# Patient Record
Sex: Female | Born: 1975 | Race: White | Hispanic: No | Smoking: Current every day smoker
Health system: Southern US, Community
[De-identification: ages and names within clinical notes are randomized; demographics above are authoritative.]

## PROBLEM LIST (undated history)

## (undated) DIAGNOSIS — N393 Stress incontinence (female) (male): Secondary | ICD-10-CM

## (undated) DIAGNOSIS — G43909 Migraine, unspecified, not intractable, without status migrainosus: Secondary | ICD-10-CM

## (undated) DIAGNOSIS — M549 Dorsalgia, unspecified: Secondary | ICD-10-CM

## (undated) DIAGNOSIS — Z9289 Personal history of other medical treatment: Secondary | ICD-10-CM

## (undated) DIAGNOSIS — R7611 Nonspecific reaction to tuberculin skin test without active tuberculosis: Secondary | ICD-10-CM

## (undated) DIAGNOSIS — F329 Major depressive disorder, single episode, unspecified: Secondary | ICD-10-CM

## (undated) DIAGNOSIS — G629 Polyneuropathy, unspecified: Secondary | ICD-10-CM

## (undated) DIAGNOSIS — G8929 Other chronic pain: Secondary | ICD-10-CM

## (undated) DIAGNOSIS — J189 Pneumonia, unspecified organism: Secondary | ICD-10-CM

## (undated) DIAGNOSIS — I82409 Acute embolism and thrombosis of unspecified deep veins of unspecified lower extremity: Secondary | ICD-10-CM

## (undated) DIAGNOSIS — M5430 Sciatica, unspecified side: Secondary | ICD-10-CM

## (undated) DIAGNOSIS — F319 Bipolar disorder, unspecified: Secondary | ICD-10-CM

## (undated) DIAGNOSIS — F39 Unspecified mood [affective] disorder: Secondary | ICD-10-CM

## (undated) DIAGNOSIS — M1990 Unspecified osteoarthritis, unspecified site: Secondary | ICD-10-CM

## (undated) DIAGNOSIS — I10 Essential (primary) hypertension: Secondary | ICD-10-CM

## (undated) DIAGNOSIS — F32A Depression, unspecified: Secondary | ICD-10-CM

## (undated) HISTORY — PX: TUBAL LIGATION: SHX77

## (undated) HISTORY — PX: BACK SURGERY: SHX140

## (undated) HISTORY — PX: LUMBAR FUSION: SHX111

## (undated) HISTORY — PX: LUMBAR DISC SURGERY: SHX700

## (undated) HISTORY — PX: GASTRIC BYPASS: SHX52

---
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
## 1998-07-18 ENCOUNTER — Emergency Department (HOSPITAL_COMMUNITY): Admission: EM | Admit: 1998-07-18 | Discharge: 1998-07-18 | Payer: Self-pay | Admitting: Emergency Medicine

## 1998-11-14 ENCOUNTER — Emergency Department (HOSPITAL_COMMUNITY): Admission: EM | Admit: 1998-11-14 | Discharge: 1998-11-14 | Payer: Self-pay | Admitting: *Deleted

## 1998-11-14 ENCOUNTER — Encounter: Payer: Self-pay | Admitting: Emergency Medicine

## 1999-03-11 ENCOUNTER — Encounter: Payer: Self-pay | Admitting: Obstetrics

## 1999-03-11 ENCOUNTER — Inpatient Hospital Stay (HOSPITAL_COMMUNITY): Admission: AD | Admit: 1999-03-11 | Discharge: 1999-03-11 | Payer: Self-pay | Admitting: Obstetrics

## 1999-06-05 ENCOUNTER — Encounter: Payer: Self-pay | Admitting: *Deleted

## 1999-06-05 ENCOUNTER — Ambulatory Visit (HOSPITAL_COMMUNITY): Admission: RE | Admit: 1999-06-05 | Discharge: 1999-06-05 | Payer: Self-pay | Admitting: *Deleted

## 1999-08-25 ENCOUNTER — Encounter: Payer: Self-pay | Admitting: *Deleted

## 1999-08-25 ENCOUNTER — Ambulatory Visit (HOSPITAL_COMMUNITY): Admission: RE | Admit: 1999-08-25 | Discharge: 1999-08-25 | Payer: Self-pay | Admitting: *Deleted

## 1999-10-05 ENCOUNTER — Encounter: Payer: Self-pay | Admitting: *Deleted

## 1999-10-05 ENCOUNTER — Inpatient Hospital Stay (HOSPITAL_COMMUNITY): Admission: AD | Admit: 1999-10-05 | Discharge: 1999-10-05 | Payer: Self-pay | Admitting: *Deleted

## 1999-10-08 ENCOUNTER — Inpatient Hospital Stay (HOSPITAL_COMMUNITY): Admission: AD | Admit: 1999-10-08 | Discharge: 1999-10-11 | Payer: Self-pay | Admitting: *Deleted

## 2000-06-10 ENCOUNTER — Encounter: Admission: RE | Admit: 2000-06-10 | Discharge: 2000-06-10 | Payer: Self-pay | Admitting: Family Medicine

## 2000-08-16 ENCOUNTER — Encounter: Admission: RE | Admit: 2000-08-16 | Discharge: 2000-08-16 | Payer: Self-pay | Admitting: Family Medicine

## 2000-09-06 ENCOUNTER — Encounter: Admission: RE | Admit: 2000-09-06 | Discharge: 2000-09-06 | Payer: Self-pay | Admitting: Family Medicine

## 2000-09-27 ENCOUNTER — Encounter: Admission: RE | Admit: 2000-09-27 | Discharge: 2000-09-27 | Payer: Self-pay | Admitting: Family Medicine

## 2000-10-14 ENCOUNTER — Ambulatory Visit (HOSPITAL_COMMUNITY): Admission: RE | Admit: 2000-10-14 | Discharge: 2000-10-14 | Payer: Self-pay | Admitting: Urology

## 2000-10-21 ENCOUNTER — Encounter: Admission: RE | Admit: 2000-10-21 | Discharge: 2000-10-21 | Payer: Self-pay | Admitting: Family Medicine

## 2000-12-16 ENCOUNTER — Encounter: Admission: RE | Admit: 2000-12-16 | Discharge: 2000-12-16 | Payer: Self-pay | Admitting: Family Medicine

## 2001-02-22 ENCOUNTER — Encounter: Admission: RE | Admit: 2001-02-22 | Discharge: 2001-02-22 | Payer: Self-pay | Admitting: Family Medicine

## 2001-02-23 ENCOUNTER — Encounter: Admission: RE | Admit: 2001-02-23 | Discharge: 2001-02-23 | Payer: Self-pay | Admitting: Family Medicine

## 2001-04-14 ENCOUNTER — Encounter: Admission: RE | Admit: 2001-04-14 | Discharge: 2001-04-14 | Payer: Self-pay | Admitting: Family Medicine

## 2001-04-16 ENCOUNTER — Emergency Department (HOSPITAL_COMMUNITY): Admission: EM | Admit: 2001-04-16 | Discharge: 2001-04-16 | Payer: Self-pay | Admitting: Emergency Medicine

## 2001-04-17 ENCOUNTER — Encounter: Admission: RE | Admit: 2001-04-17 | Discharge: 2001-04-17 | Payer: Self-pay | Admitting: Family Medicine

## 2001-04-17 ENCOUNTER — Encounter: Payer: Self-pay | Admitting: Sports Medicine

## 2001-04-17 ENCOUNTER — Inpatient Hospital Stay (HOSPITAL_COMMUNITY): Admission: RE | Admit: 2001-04-17 | Discharge: 2001-04-21 | Payer: Self-pay | Admitting: Sports Medicine

## 2001-05-02 ENCOUNTER — Encounter: Admission: RE | Admit: 2001-05-02 | Discharge: 2001-05-02 | Payer: Self-pay | Admitting: Family Medicine

## 2001-05-15 ENCOUNTER — Encounter: Admission: RE | Admit: 2001-05-15 | Discharge: 2001-05-15 | Payer: Self-pay | Admitting: Family Medicine

## 2001-06-14 ENCOUNTER — Encounter: Admission: RE | Admit: 2001-06-14 | Discharge: 2001-06-14 | Payer: Self-pay | Admitting: Family Medicine

## 2001-06-19 ENCOUNTER — Encounter: Admission: RE | Admit: 2001-06-19 | Discharge: 2001-06-19 | Payer: Self-pay | Admitting: Family Medicine

## 2001-06-26 ENCOUNTER — Encounter: Admission: RE | Admit: 2001-06-26 | Discharge: 2001-06-26 | Payer: Self-pay | Admitting: Sports Medicine

## 2001-06-29 ENCOUNTER — Encounter: Admission: RE | Admit: 2001-06-29 | Discharge: 2001-06-29 | Payer: Self-pay | Admitting: Family Medicine

## 2001-07-03 ENCOUNTER — Encounter: Admission: RE | Admit: 2001-07-03 | Discharge: 2001-07-03 | Payer: Self-pay | Admitting: Family Medicine

## 2001-07-06 ENCOUNTER — Encounter: Admission: RE | Admit: 2001-07-06 | Discharge: 2001-07-06 | Payer: Self-pay | Admitting: Family Medicine

## 2001-07-12 ENCOUNTER — Encounter: Admission: RE | Admit: 2001-07-12 | Discharge: 2001-07-12 | Payer: Self-pay | Admitting: Family Medicine

## 2001-07-18 ENCOUNTER — Encounter: Admission: RE | Admit: 2001-07-18 | Discharge: 2001-07-18 | Payer: Self-pay | Admitting: Sports Medicine

## 2001-07-22 ENCOUNTER — Emergency Department (HOSPITAL_COMMUNITY): Admission: EM | Admit: 2001-07-22 | Discharge: 2001-07-22 | Payer: Self-pay | Admitting: Emergency Medicine

## 2001-08-01 ENCOUNTER — Encounter: Admission: RE | Admit: 2001-08-01 | Discharge: 2001-08-01 | Payer: Self-pay | Admitting: Family Medicine

## 2001-08-08 ENCOUNTER — Encounter: Admission: RE | Admit: 2001-08-08 | Discharge: 2001-08-08 | Payer: Self-pay | Admitting: Family Medicine

## 2001-08-11 ENCOUNTER — Encounter: Admission: RE | Admit: 2001-08-11 | Discharge: 2001-08-11 | Payer: Self-pay | Admitting: Family Medicine

## 2001-08-17 ENCOUNTER — Encounter: Admission: RE | Admit: 2001-08-17 | Discharge: 2001-08-17 | Payer: Self-pay | Admitting: Family Medicine

## 2001-08-18 ENCOUNTER — Encounter: Admission: RE | Admit: 2001-08-18 | Discharge: 2001-08-18 | Payer: Self-pay | Admitting: Family Medicine

## 2001-08-21 ENCOUNTER — Encounter: Admission: RE | Admit: 2001-08-21 | Discharge: 2001-08-21 | Payer: Self-pay | Admitting: Family Medicine

## 2001-08-22 ENCOUNTER — Encounter (INDEPENDENT_AMBULATORY_CARE_PROVIDER_SITE_OTHER): Payer: Self-pay | Admitting: *Deleted

## 2001-08-24 ENCOUNTER — Encounter: Admission: RE | Admit: 2001-08-24 | Discharge: 2001-08-24 | Payer: Self-pay | Admitting: Family Medicine

## 2001-08-29 ENCOUNTER — Encounter: Admission: RE | Admit: 2001-08-29 | Discharge: 2001-08-29 | Payer: Self-pay | Admitting: Family Medicine

## 2001-08-29 ENCOUNTER — Other Ambulatory Visit: Admission: RE | Admit: 2001-08-29 | Discharge: 2001-08-29 | Payer: Self-pay | Admitting: Family Medicine

## 2001-09-18 ENCOUNTER — Encounter: Admission: RE | Admit: 2001-09-18 | Discharge: 2001-09-18 | Payer: Self-pay | Admitting: Sports Medicine

## 2001-09-28 ENCOUNTER — Encounter: Admission: RE | Admit: 2001-09-28 | Discharge: 2001-09-28 | Payer: Self-pay | Admitting: Family Medicine

## 2001-10-02 ENCOUNTER — Encounter: Admission: RE | Admit: 2001-10-02 | Discharge: 2001-10-02 | Payer: Self-pay | Admitting: Family Medicine

## 2001-10-13 ENCOUNTER — Encounter: Admission: RE | Admit: 2001-10-13 | Discharge: 2001-10-13 | Payer: Self-pay | Admitting: Family Medicine

## 2001-10-26 ENCOUNTER — Emergency Department (HOSPITAL_COMMUNITY): Admission: EM | Admit: 2001-10-26 | Discharge: 2001-10-26 | Payer: Self-pay | Admitting: Emergency Medicine

## 2001-11-08 ENCOUNTER — Emergency Department (HOSPITAL_COMMUNITY): Admission: EM | Admit: 2001-11-08 | Discharge: 2001-11-08 | Payer: Self-pay | Admitting: Emergency Medicine

## 2002-01-09 ENCOUNTER — Emergency Department (HOSPITAL_COMMUNITY): Admission: EM | Admit: 2002-01-09 | Discharge: 2002-01-09 | Payer: Self-pay | Admitting: Emergency Medicine

## 2002-10-29 ENCOUNTER — Emergency Department (HOSPITAL_COMMUNITY): Admission: EM | Admit: 2002-10-29 | Discharge: 2002-10-29 | Payer: Self-pay

## 2003-04-03 ENCOUNTER — Emergency Department (HOSPITAL_COMMUNITY): Admission: EM | Admit: 2003-04-03 | Discharge: 2003-04-03 | Payer: Self-pay | Admitting: Emergency Medicine

## 2003-05-24 ENCOUNTER — Emergency Department (HOSPITAL_COMMUNITY): Admission: EM | Admit: 2003-05-24 | Discharge: 2003-05-24 | Payer: Self-pay | Admitting: Emergency Medicine

## 2003-10-13 ENCOUNTER — Emergency Department (HOSPITAL_COMMUNITY): Admission: EM | Admit: 2003-10-13 | Discharge: 2003-10-13 | Payer: Self-pay | Admitting: Emergency Medicine

## 2004-03-28 ENCOUNTER — Emergency Department (HOSPITAL_COMMUNITY): Admission: EM | Admit: 2004-03-28 | Discharge: 2004-03-28 | Payer: Self-pay | Admitting: Emergency Medicine

## 2004-05-21 ENCOUNTER — Emergency Department (HOSPITAL_COMMUNITY): Admission: EM | Admit: 2004-05-21 | Discharge: 2004-05-21 | Payer: Self-pay | Admitting: Emergency Medicine

## 2004-05-25 IMAGING — CR DG CHEST 1V PORT
1 series · 1 of 1 positions shown · non-contrast
Comparison: report from prior chest radiograph dated 04/17/01.

CLINICAL DATA: chest pain   
 PORTABLE CHEST 10/13/03

[view not recorded]
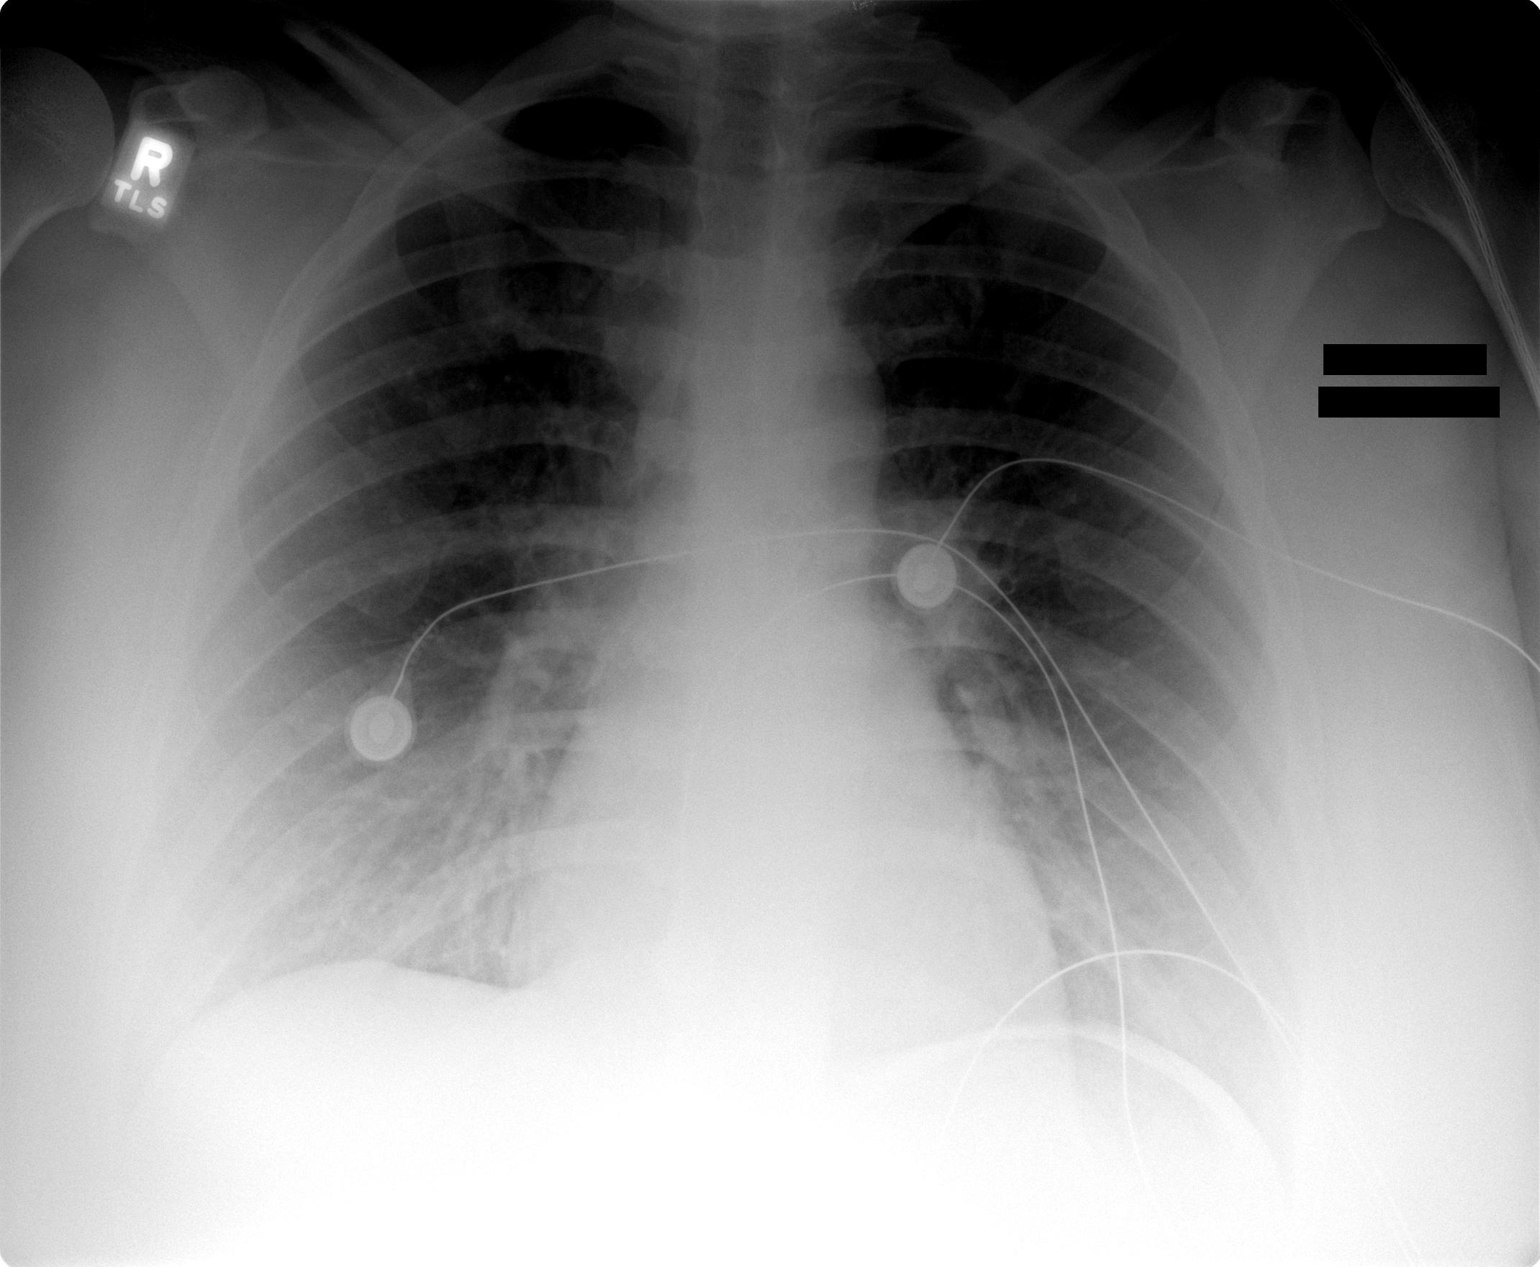

[1 of 1 positions shown; findings below may reference images not displayed]

FINDINGS: There is some mild airway thickening and suggestion of vascular crowding in the lung bases but no cephalization of blood flow to suggest edema.  No discrete airspace opacity is identified.  Heart size is within normal limits.
 IMPRESSION
 Minimal airway thickening.  No evidence of airspace opacity.

## 2004-11-06 ENCOUNTER — Emergency Department (HOSPITAL_COMMUNITY): Admission: EM | Admit: 2004-11-06 | Discharge: 2004-11-07 | Payer: Self-pay | Admitting: Emergency Medicine

## 2005-02-04 ENCOUNTER — Emergency Department (HOSPITAL_COMMUNITY): Admission: EM | Admit: 2005-02-04 | Discharge: 2005-02-04 | Payer: Self-pay | Admitting: Emergency Medicine

## 2005-06-19 IMAGING — CT CT HEAD W/O CM
1 series · 16 of 30 positions shown, 20 images · IV contrast (agent unspecified)
Comparison: none

CLINICAL DATA: Severe headache for two days.
 HEAD CT WITHOUT CONTRAST:
TECHNIQUE: 5 mm collimated images were obtained from the base of the skull through the vertex according to standard protocol without contrast.

[Series 2: brain · axial · 0.49mm/px · z∈[+111,+251]mm · 16 of 30 slices shown, 20 images]
[im 2/30  brain]
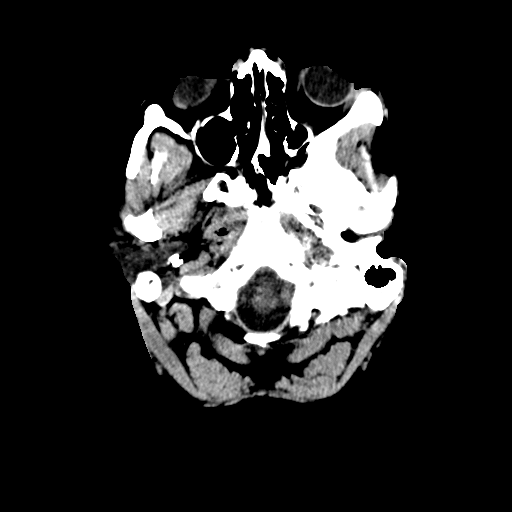
[im 2/30  bone]
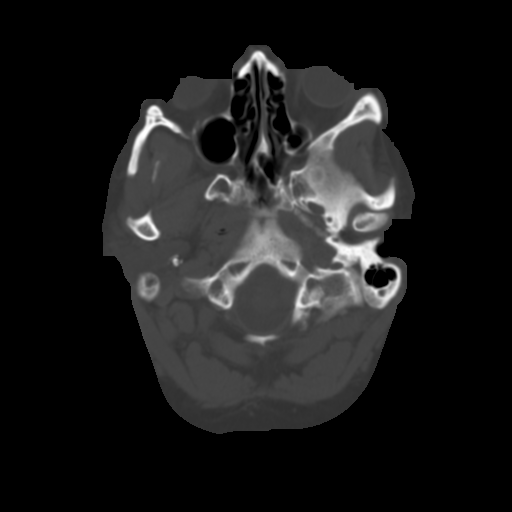
[im 4/30  brain]
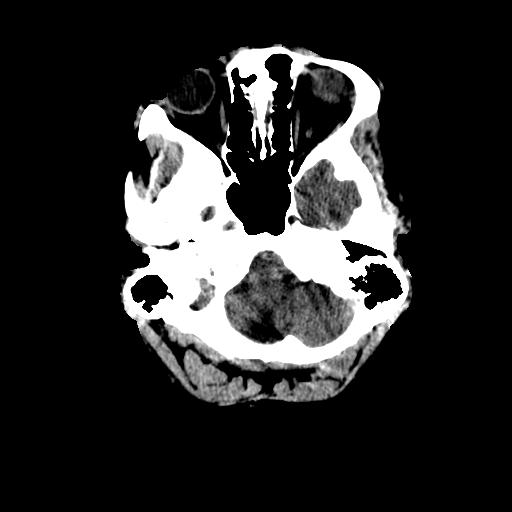
[im 6/30  brain]
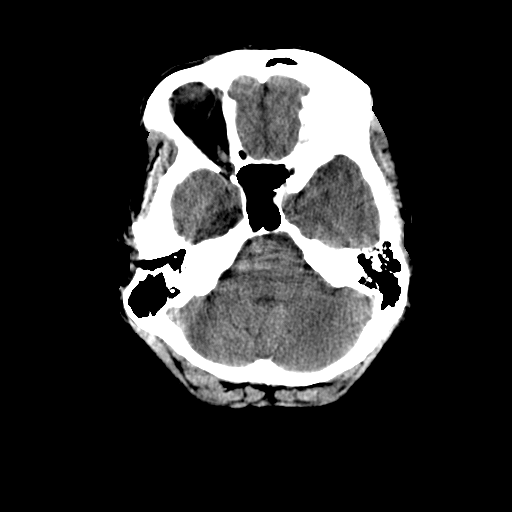
[im 8/30  brain]
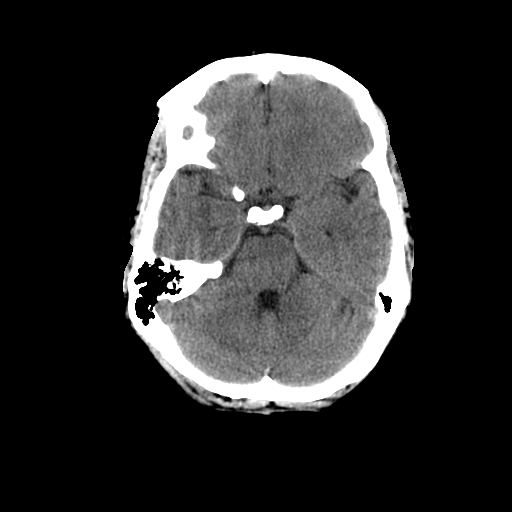
[im 9/30  brain]
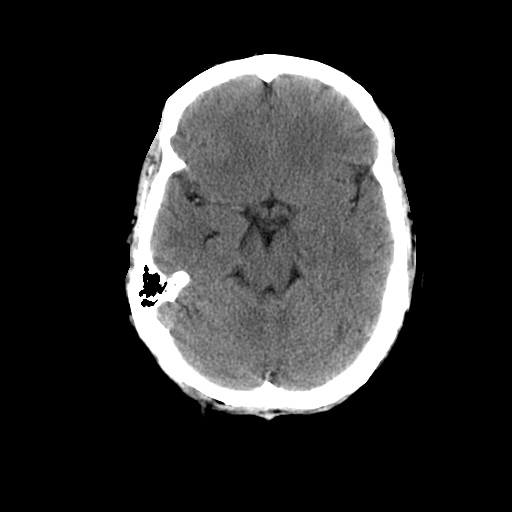
[im 9/30  bone]
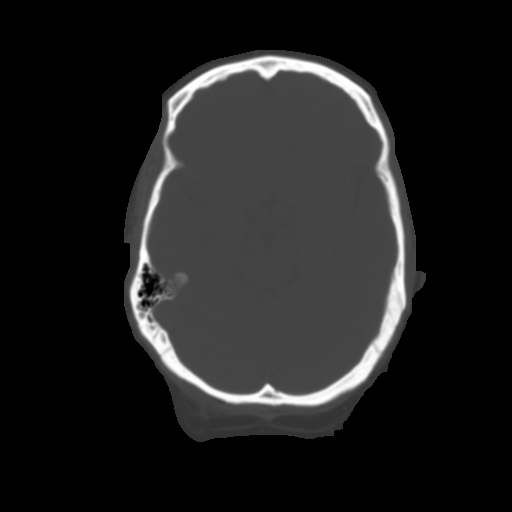
[im 11/30  brain]
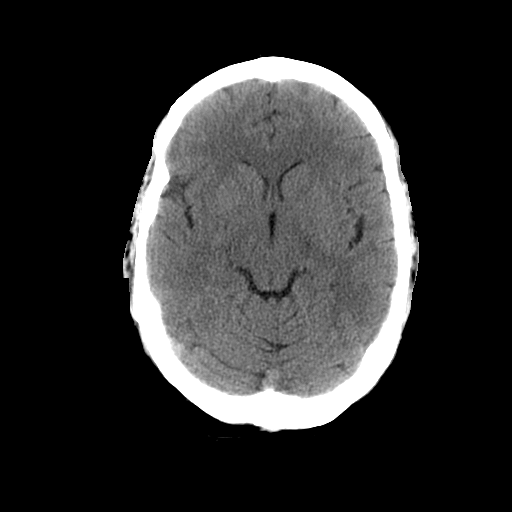
[im 13/30  brain]
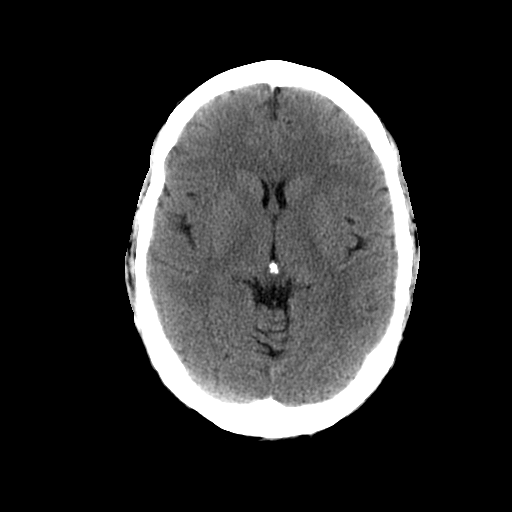
[im 15/30  brain]
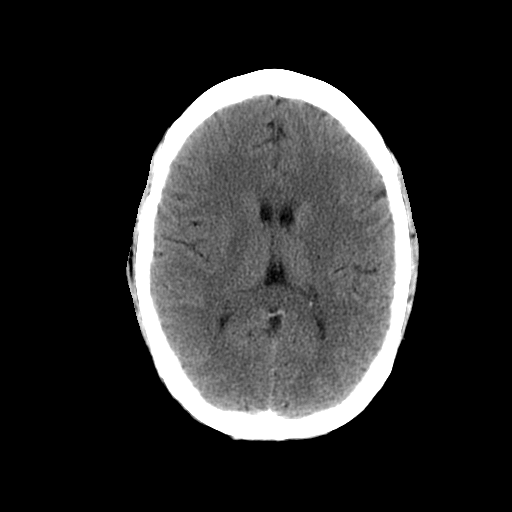
[im 16/30  brain]
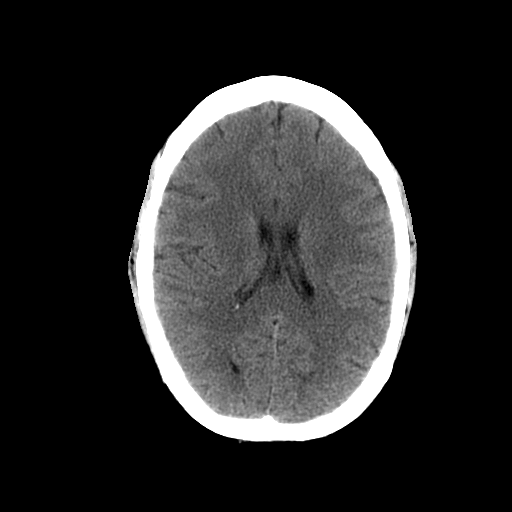
[im 16/30  bone]
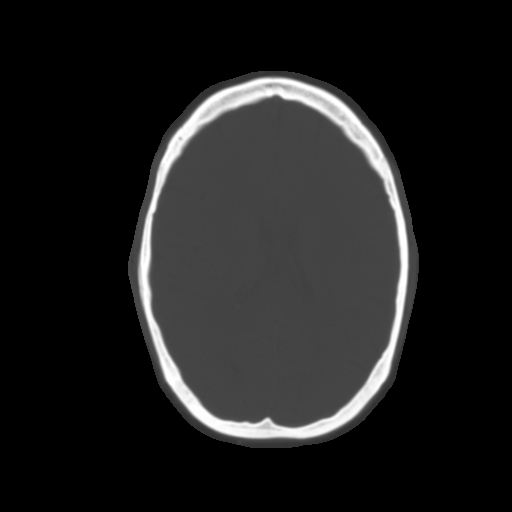
[im 18/30  brain]
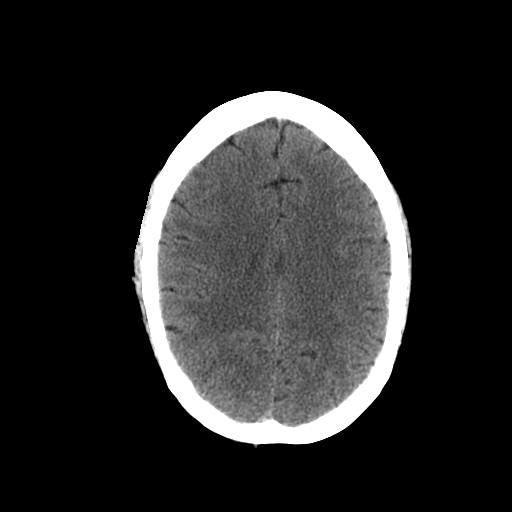
[im 20/30  brain]
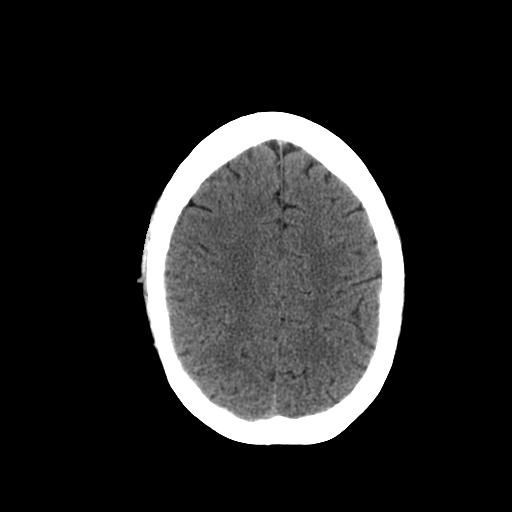
[im 22/30  brain]
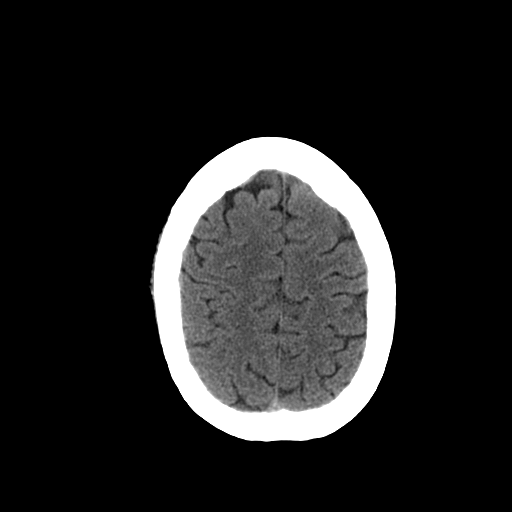
[im 23/30  brain]
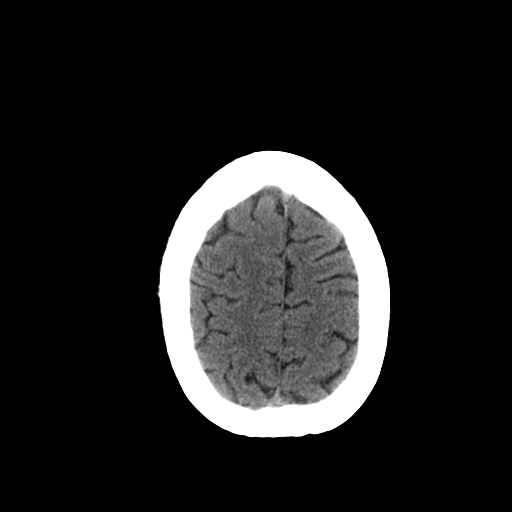
[im 23/30  bone]
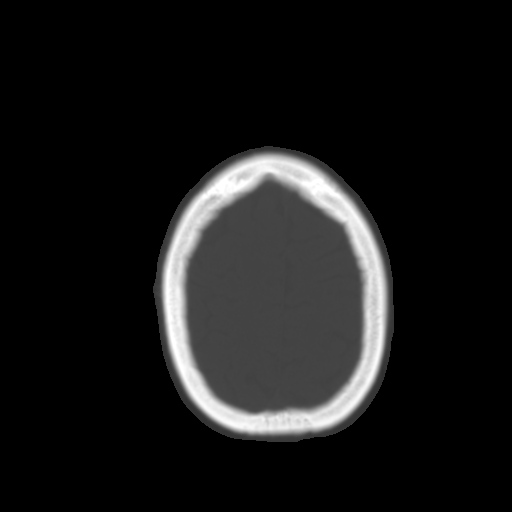
[im 25/30  brain]
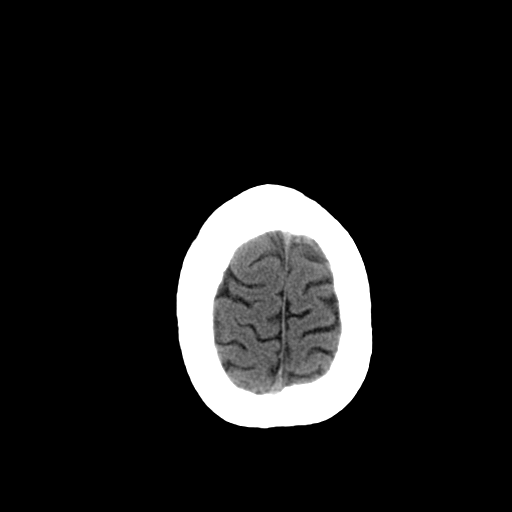
[im 27/30  brain]
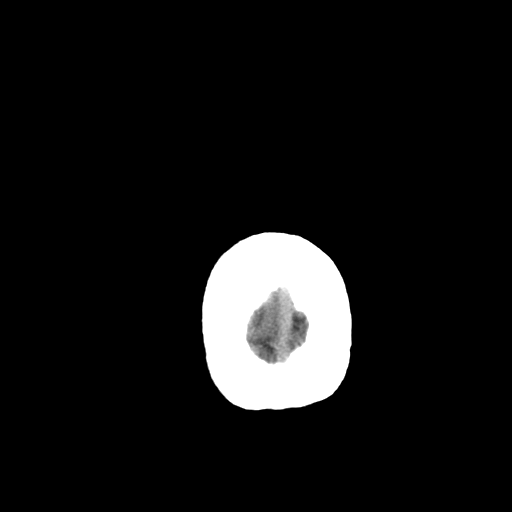
[im 29/30  brain]
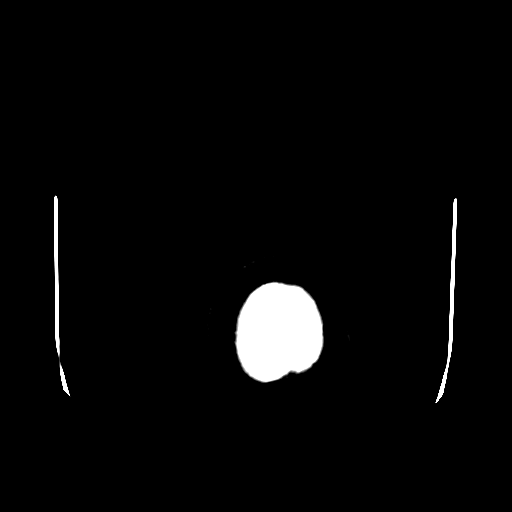

[16 of 30 positions shown; findings below may reference images not displayed]

FINDINGS: The ventricular system is normal in size and configuration and the septum is in a normal midline position.  The fourth ventricle and basilar cisterns appear normal.  No acute intracranial abnormality is seen.  No mass effect is noted.  On bone window images no bony abnormality is seen.
IMPRESSION: Negative unenhanced CT brain scan.

## 2005-08-27 ENCOUNTER — Emergency Department (HOSPITAL_COMMUNITY): Admission: EM | Admit: 2005-08-27 | Discharge: 2005-08-27 | Payer: Self-pay | Admitting: Emergency Medicine

## 2005-09-09 ENCOUNTER — Encounter: Admission: RE | Admit: 2005-09-09 | Discharge: 2005-09-09 | Payer: Self-pay | Admitting: Orthopedic Surgery

## 2005-09-09 ENCOUNTER — Emergency Department (HOSPITAL_COMMUNITY): Admission: EM | Admit: 2005-09-09 | Discharge: 2005-09-09 | Payer: Self-pay | Admitting: Family Medicine

## 2005-09-22 ENCOUNTER — Emergency Department (HOSPITAL_COMMUNITY): Admission: EM | Admit: 2005-09-22 | Discharge: 2005-09-22 | Payer: Self-pay | Admitting: Emergency Medicine

## 2005-10-14 ENCOUNTER — Encounter: Admission: RE | Admit: 2005-10-14 | Discharge: 2005-12-23 | Payer: Self-pay | Admitting: Orthopedic Surgery

## 2005-10-31 ENCOUNTER — Emergency Department (HOSPITAL_COMMUNITY): Admission: EM | Admit: 2005-10-31 | Discharge: 2005-10-31 | Payer: Self-pay | Admitting: Emergency Medicine

## 2005-11-12 ENCOUNTER — Emergency Department (HOSPITAL_COMMUNITY): Admission: EM | Admit: 2005-11-12 | Discharge: 2005-11-13 | Payer: Self-pay | Admitting: Emergency Medicine

## 2006-04-22 IMAGING — MR MR LUMBAR SPINE WO/W CM
4 of 9 series · 18 of 48 positions shown · IV contrast (20 ML MAGNEVIST)
Comparison: none

CLINICAL DATA: Low back pain, left hip and leg pain beginning three weeks ago.  History of prior surgery.
 MRI LUMBAR SPINE WITHOUT AND WITH CONTRAST:
TECHNIQUE: Multiplanar and multiecho pulse sequences of the lumbar spine, to include the lower thoracic region and upper sacral regions, were obtained according to standard protocol before and after administration of intravenous contrast.
 Contrast:  20 cc Magnevist.

[Series 8: T1 · sagittal · 4.0mm · 0.55mm/px · 4 of 11 slices shown (1 of 2)]
[im 1/11]
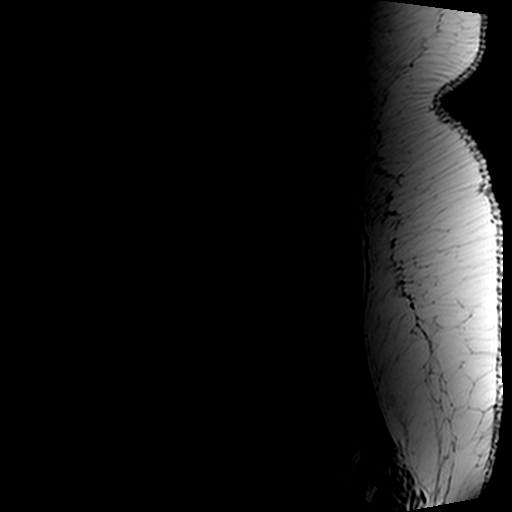
[im 4/11]
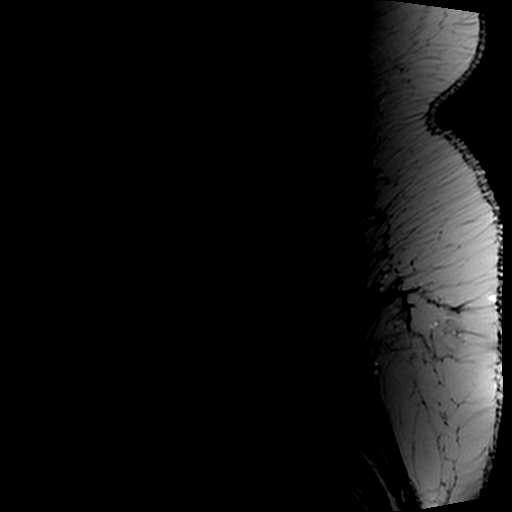
[im 7/11]
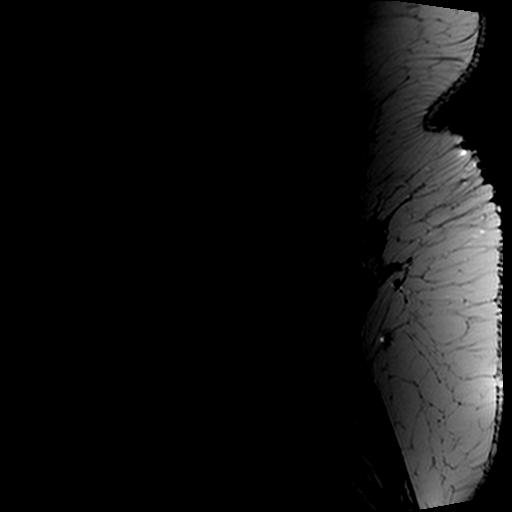
[im 11/11]
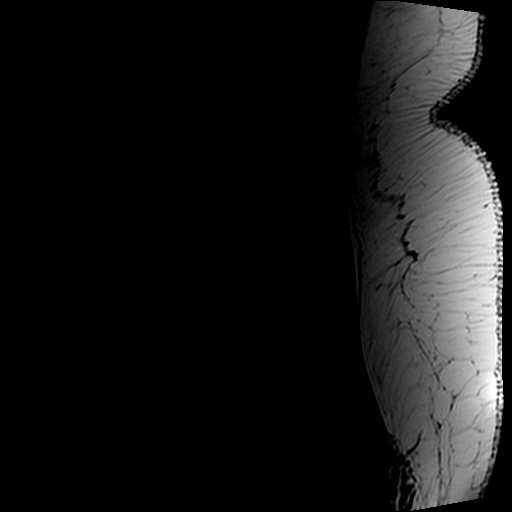

[Series 9: T2 · sagittal · 4.0mm · 0.55mm/px · 4 of 11 slices shown (1 of 2)]
[im 1/11]
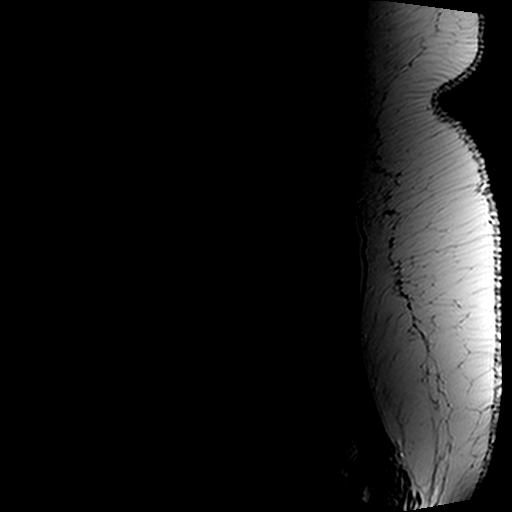
[im 4/11]
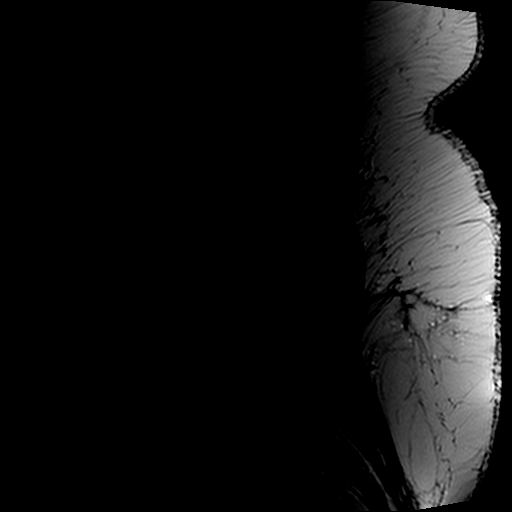
[im 7/11]
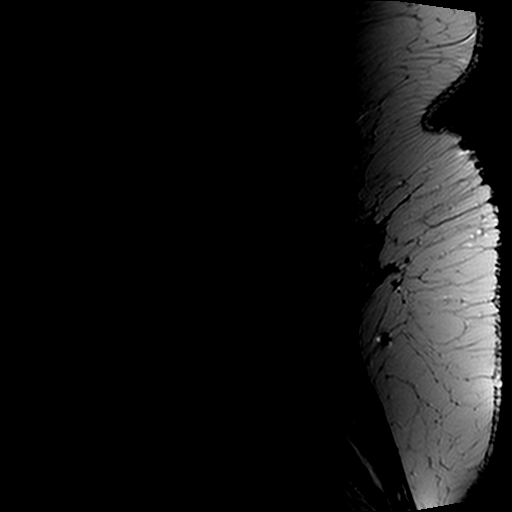
[im 11/11]
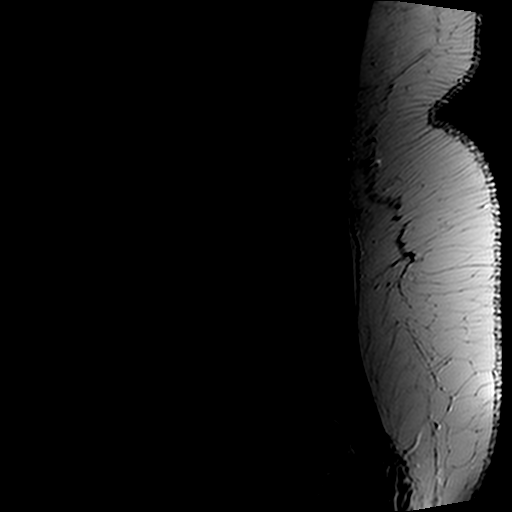

[Series 12: T1 · axial · 4.0mm · 0.43mm/px · z∈[-106,+19]mm · 3 of 22 slices shown (2 of 2)]
[im 4/22]
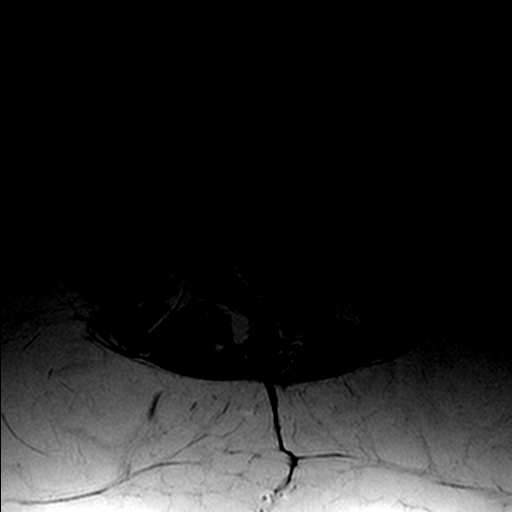
[im 11/22]
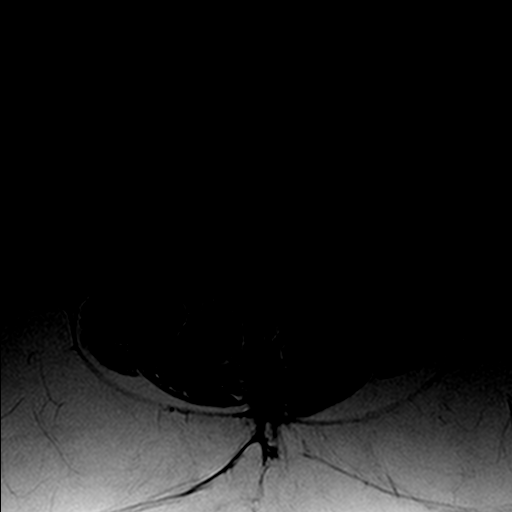
[im 18/22]
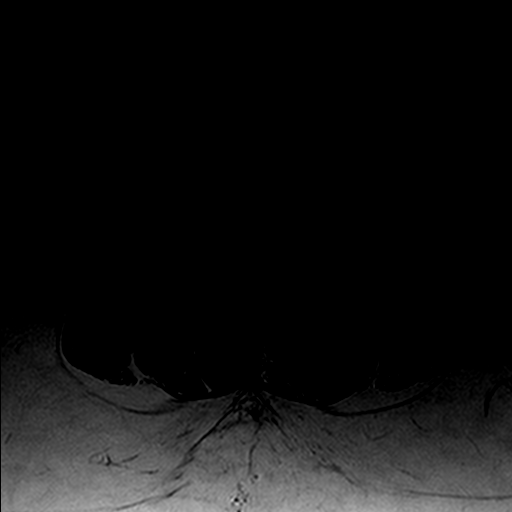

[Series 14: T2 · axial · 4.0mm · 0.43mm/px · z∈[-120,+73]mm · 7 of 22 slices shown (2 of 2)]
[im 1/22]
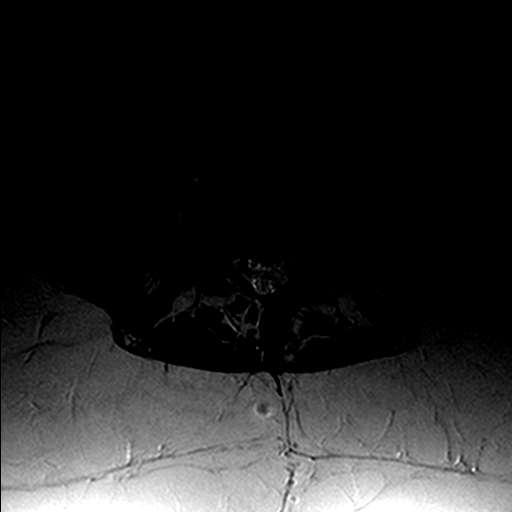
[im 4/22]
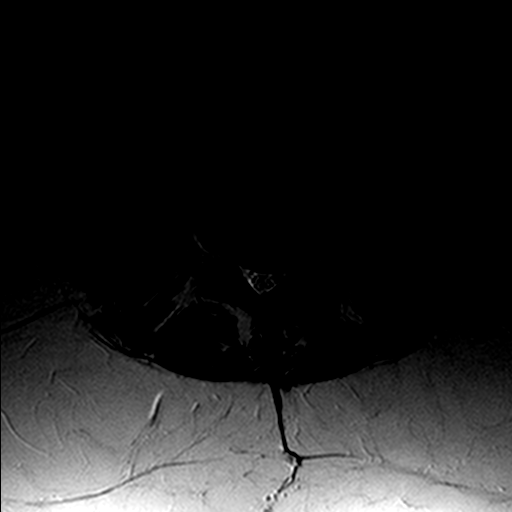
[im 8/22]
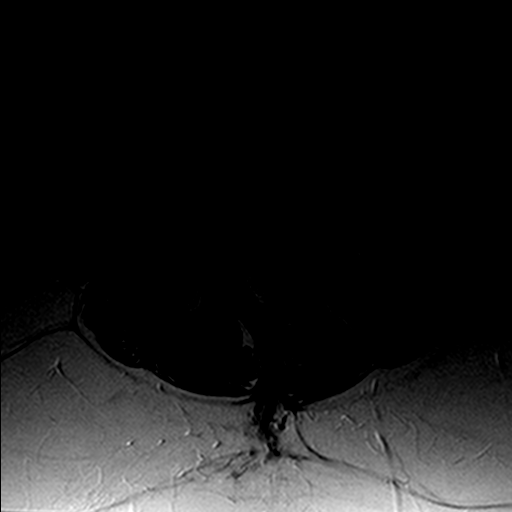
[im 11/22]
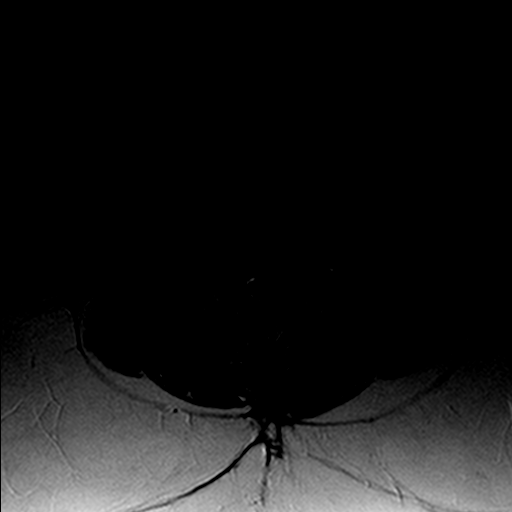
[im 15/22]
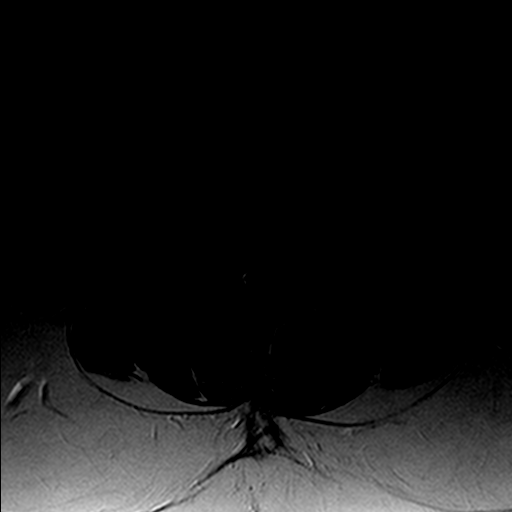
[im 18/22]
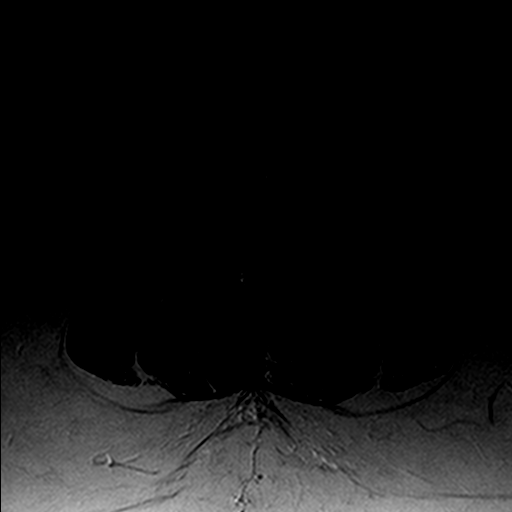
[im 22/22]
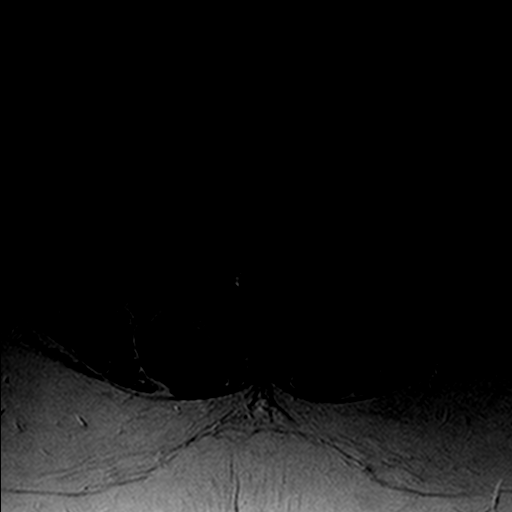

[18 of 48 positions shown; findings below may reference images not displayed]

FINDINGS: There is no abnormality at L2-3 or above.  The disks are unremarkable in signal characteristics and morphology.  The spinal canal and foramina are widely patent.  The distal cord and conus are normal.  
 At L3-4, the disk is degenerated and there is a central herniation that indents the thecal sac but does not result in gross neural compression.  
 At L4-5, there has been previous partial left hemilaminectomy. The disk is degenerated and there is a central disk herniation.  This indents the thecal sac and narrows the lateral recesses.  This could be symptomatic.  
 At L5-S1, there has been left hemilaminectomy.  The disk is degenerated with loss of height.  There is a shallow broad-based disk protrusion eccentrically more pronounced toward the left, with associated osteophytes.  There is some narrowing of the subarticular lateral recess on the left, but the S1 nerve root does not appear grossly compressed.
IMPRESSION: 1.  Central disk herniation at L3-4 that indents the thecal sac but does not result in gross neural compression. 
 2.  Previous left posterolateral decompression of L4-5.  Moderate sized broad-based disk herniation which indents the thecal sac and narrows both lateral recesses, more on the left.  In particular, the left L5 nerve root could be compressed in this location. 
 3.  Previous partial hemilaminectomy on the left at L5-S1.  Mild narrowing of the subarticular lateral recess on the left because of shallow protrusion of disk material with associated osteophytes.  The left S1 nerve root is not definitely compressed but it could be irritated.

## 2006-07-22 ENCOUNTER — Encounter (INDEPENDENT_AMBULATORY_CARE_PROVIDER_SITE_OTHER): Payer: Self-pay | Admitting: *Deleted

## 2009-11-24 ENCOUNTER — Emergency Department (HOSPITAL_COMMUNITY): Admission: EM | Admit: 2009-11-24 | Discharge: 2009-11-24 | Payer: Self-pay | Admitting: Emergency Medicine

## 2010-06-07 ENCOUNTER — Emergency Department (HOSPITAL_COMMUNITY)
Admission: EM | Admit: 2010-06-07 | Discharge: 2010-06-07 | Payer: Self-pay | Source: Home / Self Care | Admitting: Emergency Medicine

## 2010-10-09 NOTE — H&P (Signed)
[place]. [facility]  Patient:    Terri [name] Visit Number: [id number] MRN: [id number]          Service Type: MED Location: [id number] Attending Physician:  Garnette Scheuermann Dictated by:   Raynelle Jan, M.D. Admit Date:  04/17/2001   CC:         Maurice March, M.D.   History and Physical  Terri [name] is a 35 year old female with thromboembolic risk factors of smoking, obesity, and oral contraceptive use who presented to the clinic this morning as a work-in with a chief complaint of leg pain and swelling.  Per the patient her left leg has been hurting now for approximately one week with pain mainly in the calf.  She has noted swelling in that extremity x 3 days now as well as worsening pain this weekend necessitating use of her boyfriends Vicodin.  She notes no recent immobility, travel, or recent surgeries.  She notes a possible history of a superficial thrombophlebitis about five years ago but she cannot remember if she saw a doctor for it.  She has been on Ortho-Cyclen for approximately 11 years.  Has not increased her pack per day smoking habit and has not noted any recent weight changes.  REVIEW OF SYSTEMS:  Significant only for some slight constipation over the past week.  Otherwise, she denies chest pain, shortness of breath, hemoptysis, fevers, or chills.  She notes no bright red blood per rectum or melena.  Her last menstrual period was approximately one month ago and regular.  Otherwise, her review of systems is per the HPI.  PROBLEM LIST:  Glucose intolerance, obesity, depression, migraine, frequent UTIs, stress incontinence, hidradenitis.  PAST SURGICAL HISTORY:  Cesarean section x 2, left ankle surgery in 1989, back surgery in the early 1990s, ureteropexy in October 2002.  MEDICATIONS: 1. Maxalt 10 mg p.o. p.r.n. migraine. 2. Ortho-Cyclen p.o. q.d. as directed. 3. Verapamil 80 mg p.o. q.d. 4. Elavil 25 mg p.o. q.h.s. 5.  Vicodin p.r.n. leg pain.  ALLERGIES:  No known drug allergies.  SOCIAL HISTORY:  She works full-time as a Lawyer at [employer].  She has two children.  Smokes one pack per day.  Occasionally uses alcohol and occasionally uses marijuana.  FAMILY HISTORY:  Her mother possibly had a history of a blood clot as well as hypertension and COPD.  PHYSICAL EXAMINATION  VITAL SIGNS:  Temperature 97.3, pulse 81, respirations 20, blood pressure 131/103, saturation 98% on room air.  GENERAL:  She is an obese, pleasant female in no acute distress, alert and oriented x 4.  HEENT:  Pupils are equal, round and reactive to light.  No scleral icterus noted.  Mucous membranes are moist and pink.  Throat is without erythema or exudate.  NECK:  Supple without JVD, lymphadenopathy, thyromegaly, or bruits.  CARDIOVASCULAR:  Normal S1, S2.  Regular rate and rhythm.  Distant heart sounds.  No appreciable murmurs, rubs, or gallops.  LUNGS:  Clear to auscultation bilaterally.  Good air movement throughout.  ABDOMEN:  Soft, nontender, nondistended.  Normoactive bowel sounds.  No hepatosplenomegaly or masses.  RECTAL:  Heme-negative.  EXTREMITIES:  She has 2+ pedal pulses.  The left lower extremity is significant for a slight amount of swelling.  It is warm medially with a single circular area of erythema on the medial aspect of the calf.  Homans sign is positive on the left.  NEUROLOGIC:  She is intact.  LABORATORIES:  Lower extremity Dopplers are  positive for a DVT in the left calf.  ASSESSMENT AND PLAN:  This is a 35 year old female with risk factors of tobacco use, oral contraceptives, and obesity who is admitted with a left calf deep venous thrombosis. 1. Deep venous thrombosis.  We will admit for anticoagulation with Lovenox and    will start Coumadin in the a.m.  Will need to encourage smoking cessation    and find an alternative birth control means. 2. Depression.  We will continue her home  regimen of Elavil. 3. Glucose intolerance.  We will check a fasting glucose in the a.m. 4. Migraines.  We will continue her on current regimen. 5. Contraception.  She will need an alternative method.  We will check a UPEP    prior to starting Coumadin secondary to ______ effects. 6. Elevated blood pressure.  We will need to monitor her blood pressures    during hospitalization.  If blood pressures remain elevated would consider    increasing her verapamil as she is currently using the medication for    migraine control. Dictated by:   Raynelle Jan, M.D. Attending Physician:  Garnette Scheuermann DD:  04/17/01 TD:  04/17/01 Job: [id number]

## 2010-10-09 NOTE — H&P (Signed)
[facility]  Patient:    Terri [name], Terri [name]                        MRN: [id number] Adm. Date:  [id number] Attending:  Deniece Ree                         History and Physical  HISTORY OF PRESENT ILLNESS:   The patient is a 35 year old, gravida 3, para 1, abortus 1, whose estimated date of confinement by early ultrasound was October 26, 1999.  The patient had a previous delivery in 1997 for a placenta previa at 32 weeks.  The patient has done very well throughout this pregnancy.  She was seen in my office today at which time complaining of uterine contractions.  On evaluation, the patient was having uterine contractions, somewhat mild, and moderate amount of bleeding through the cervical os.  She was then scheduled for a repeat cesarean  section.  PHYSICAL EXAMINATION:  GENERAL:                      Revealed a well-developed, well-nourished, obese female in labor-type distress.  HEENT:                        Within normal limits.  NECK:                         Supple.  BREASTS:                      Without masses, tenderness, or discharge.  LUNGS:                        Clear to auscultation and percussion.  HEART:                        Normal sinus rhythm without murmurs, rubs, or gallops.  ABDOMEN:                      Obese, however, approximately [redacted] weeks gestation.   EXTREMITIES: NEUROLOGICAL:    Within normal limits.  PELVIC:                       Only done by way of a speculum which would reveal a moderate amount of blood coming from the cervical os.  ADMISSION DIAGNOSES:          1. Intrauterine pregnancy at approximately [redacted] weeks                                  gestation.                               2. Previous cesarean section.                               3. Complete previa.  PLAN:                         A repeat cesarean section. DD:  10/08/99 TD:  10/10/99 Job: [id number]

## 2010-10-09 NOTE — Op Note (Signed)
[facility]  Patient:    Terri [name], Terri [name]                        MRN: [id number] Proc. Date: 10/14/00 Adm. Date:  [id number] Attending:  Londell Moh CC:         [facility]   Operative Report  PREOPERATIVE DIAGNOSIS:  Stress urinary incontinence.  POSTOPERATIVE DIAGNOSIS:  Stress urinary incontinence.  OPERATION:  Cystoscopy and tension-free vaginal tape.  SURGEON:  Jamison Neighbor, M.D.  ANESTHESIA:  General.  COMPLICATIONS:  None.  DRAINS:  45 French Foley catheter.  BRIEF HISTORY:  This 35 year old female has stress urinary incontinence.  This has been proven by urodynamics as well as cystoscopy.  The patient does not have evidence of obstruction or detrusor instability but did have a diminished leak point pressure.  The patient is interested in have her stress incontinence repaired.  She understands the risks and benefits of the procedure including the possibility of overcorrection, injury to the urethra or other structures.  She also realizes that there is a possibility that she may develop detrusor instability that will require additional therapy and that if the sling is too tight, it may require revision.  The patient also understands that long-term she will need to try and stop smoking and lose weight if she wants to preserve control of her urinary flow long-term.  She gave full and informed consent.  DESCRIPTION OF PROCEDURE:  After the successful induction of general anesthesia, the patient was placed in the dorsal lithotomy position and prepped with Betadine and draped in the usual sterile fashion.  A weighted vaginal speculum was placed.  The labia were sutured out to the medial thigh. Tape was used to pull the abdominal pannus out of the way.  The entire area was then prepped and draped.  A Foley catheter was inserted.  Inspection showed that the patient had a very modest cystocele.  Because of her young age,  it was felt that cystocele repair and hysterectomy were inappropriate but that a simple sling is her best option.  The anterior vaginal mucosa was infiltrated with a local anesthetic.  An incision was made in the mid urethra approximately 1 cm in length.  This was dissected out laterally, making a small pocket but not entering or going through the endopelvic fascia.  Two stab incisions were made 3 fingerbreadths apart directly above the pubic bone. The needle passer system was then passed from the abdominal incision down to the vaginal incision.  A guide was used to push the bladder out of the way during passage.  The cystoscope was inserted; the bladder was carefully inspected.  It was free of any tumor or stones.  Both ureteral orifices were normal in configuration and location.  The needles had not passed anywhere into the bladder, and there was no sign of any injury to the bladder.  The sling was then pulled up to the abdominal incision using the needle pusher system guided by the abdominal needle guide.  The sling was then positioned just underneath the mid urethra with a 24 French urethral sound just underneath the urethra to prevent it from becoming too tight.  The Hegar was left in place while the protective coating from the sling was removed, thus looking it into position.  Careful inspection showed there was still a small loop of tissue just underneath the urethra.  The urethra was still in good position and  would accept the 24 sound with no angulation or tightness, and this went in at a normal angle without excessive correction.  The incision was then closed with a running suture of 2-0 Vicryl.  The labial stitches were removed.  The area was irrigated and packed with vaginal gauze.  The sling was cut off below the skin level and then closed with a 2-0 Vicryl and Steri-Strips.  The patient tolerated the procedure well and was taken to the recovery room in good condition.  She will  be given a voiding trial this afternoon and if she voids, she will go home without a catheter. DD:  10/14/00 TD:  10/14/00 Job: [id number]

## 2010-10-09 NOTE — Discharge Summary (Signed)
[facility]  Patient:    Terri [name], Terri [name]                        MRN: [id number] Adm. Date:  [id number] Disc. Date: [id number] Attending:  Deniece Ree                           Discharge Summary  DISCHARGE DIAGNOSES:          1. Intrauterine pregnancy at approximately 37                                  weeks.                               2. Placenta previa.  PROCEDURE:                    Repeat cesarean section.  HISTORY OF PRESENT ILLNESS:   The patient is a 36 year old, gravida 3, para 1, abortus 1, who had an EDC of October 26, 1999, by ultrasound.  The patient was also  diagnosed at that time as having a placenta previa and throughout the pregnancy, this was followed.  At [redacted] weeks gestation the patient began to have a moderate amount of bleeding and was brought in for a repeat cesarean section.  HOSPITAL COURSE:              On the day of admission, the patient underwent a repeat cesarean section at which time, she had a female infant with Apgars of 9 and 9.  The patient tolerated the procedure very well without any problems. Postoperatively, she did very well without any complications and was discharged on the third postoperative day.  DISCHARGE INSTRUCTIONS:       She was instructed on the possible complications nd care following this type of surgery.  FOLLOW-UP:                    She was told to return to my office in four weeks for follow-up evaluation or to call me prior to that time should any problems arise. DD:  10/28/99 TD:  10/29/99 Job: [id number]

## 2010-10-09 NOTE — Discharge Summary (Signed)
[place]. [facility]  Patient:    Terri, [name] Visit Number: [id number] MRN: [id number]          Service Type: MED Location: 5000 5001 01 Attending Physician:  Doneta Public Dictated by:   Osie Bond Merilynn Finland, M.D. Admit Date:  04/17/2001 Discharge Date: 04/21/2001   CC:         Terri [name], M.D. [facility]   Discharge Summary  DISCHARGE DIAGNOSES: 1. Deep vein thrombosis. 2. Tobacco abuse. 3. Obesity. 4. Depression. 5. Frequent urinary tract infections.  STUDIES:  Lower extremity Dopplers on 04/17/2001 which showed positive deep venous thrombosis in the left calf.  DISCHARGE MEDICATIONS: 1. Lovenox 140 mg subcutaneously q.12h until the patients INR reaches 2.0. 2. Coumadin 10 mg p.o. q.d. around 6 PM. 3. Macrodantin 50 mg p.o. q.4h x three tablets on the day of discharge to    finish treatment for urinary tract infection. 4. Bupropion 100 mg p.o. b.i.d. for depression and smoking cessation. 5. Elavil 25 mg p.o. qhs for sleep. 6. Verapamil 80 mg p.o. q.d. for high blood pressure. 7. Nicotine patches 22 mg one topically q.d. alternate sites.  DISCHARGE INSTRUCTIONS:  Activity as tolerated.  The patient was advised to use extra-strength Tylenol as needed for leg soreness.  She was advised to return to the emergency room if she had any sudden onset of chest pain or shortness of breath.  She was told not to smoke while wearing Nicotine patches.  She was advised to stop smoking as continuing tobacco abuse will put her at greater risk for repeat thrombotic events.  She was advised not to take her birth control pills as these will also increase her risk for thrombotic events and she was advised to use condoms or diaphragm to prevent pregnancy until her IUD is placed.  The patient was told to call the [facility] to see Dr. Rodman Pickle during the week following discharge for an INR check and to have her IUD placed.  The  [facility] was alerted that the patient would be calling for this appointment.  Case management set up a home health nurse to visit the patient at home daily to check INR levels every day and to help her to administer her Lovenox. The patient did agree to self administer Lovenox at home.  The patient will be able to stop Lovenox injections when her INR reaches 2.0.  BRIEF HOSPITAL COURSE: See dictated history and physical.  The patient is a 35 year old Caucasian female with thromboembolic risk factors of obesity, smoking, oral contraceptive use, who presented to the family practice clinic on the day of admission with leg pain and swelling and was subsequently admitted to the hospital and was found to have a DVT in her left calf on Dopplers.  She was started on anticoagulation with Lovenox and Coumadin.  On admission her INR was 1.1. Throughout her admission she was repeatedly counseled for smoking cessation and that she would need to find an alternate form of birth control.  She was very resistant to smoking cessation. She was started on Nicotine patches and Wellbutrin while in the hospital with some relief but at the time of discharge was unsure whether she would truly try to quit smoking.  She was given prescriptions for both Nicotine patches and for Wellbutrin at the time of discharge with the advice that she should quit smoking.  With regards to the issue of birth control, she declined Depo-Provera injections as these would  likely increase her weight and she already has obesity.  She preferred to have an IUD placed by her primary doctor shortly after discharge.  Of note, the patient has a history of frequent short sexual relationships and therefore may be at higher risk for PID if she is not monogamous and has an IUD in place.  However the patient states she is currently in a monogamous relationship and prefers this option for birth control.  An IUD will be placed by her  primary doctor the week following discharge and the patient was advised to use condoms or a diaphragm until this IUD is placed.  HOSPITAL COURSE: #1 - DEEP VENOUS THROMBOSIS:  Treated with anticoagulation as above.  The patient will continue to receive Lovenox and Coumadin at home.  She will need to continue a six month course of Coumadin.  INR levels should be checked by her regular doctor.  A home health nurse will visit her daily for INR checks following discharge and she may stop the Lovenox injections when her INR reaches 2.0.  Further Coumadin dosing will be per her primary doctor.  Over the weekend following discharge Dr. Rosemarie Ax will be called by the home health nurses with her daily INRs.  Smoking cessation and birth control as above.  #2 - TOBACCO ABUSE:  As above.  #3 - OBESITY:  Counseled weight loss.  #4 - DEPRESSION:  The patient was started a few days prior to admission on Elavil.  However, this dose of Elavil was only adequate for sleep at bedtime. As the patient is on Coumadin and will therefore have problems with interactions to many medicines, Wellbutrin was chosen to further treat her depression.  This medicine will also help her with smoking cessation.  She was started on a dose of 100 mg p.o. b.i.d. but after 4 to 7 days this dose may be increased to 100 mg p.o. t.i.d. as the patients primary doctor sees fit. Wellbutrin has no interaction with Coumadin to our knowledge.  Of note, the patient was seen during hospitalization by the family practice staff psychologist, Rondel Jumbo, PsyD, and disclosed a history of prior significant depression with suicidality recently.  She denied suicidal intention or plan while in the hospital but does appear to have a significant depression history which will need to be certainly addressed on an outpatient basis.  #5 - FREQUENT URINARY TRACT INFECTIONS:  The patient did have a urine culture during hospitalization  which was positive for greater than 100,000 colonies of  E Coli.  This culture was pansensitive so the patient was started on Macrodantin during hospitalization.  This medicine was chosen as it has less interaction with Coumadin.  She will finish her third day of this medicine on the day of discharge.  DISCHARGE LABORATORY STUDIES: On the day of discharge the patients INR was 1.5.  Therapeutic INR for her is 2 to 3. Dictated by:   Osie Bond Merilynn Finland, M.D. Attending Physician:  Doneta Public DD:  04/21/01 TD:  04/21/01 Job: [id number]

## 2010-10-09 NOTE — Op Note (Signed)
[facility]  Patient:    Terri [name], Terri [name]                        MRN: [id number] Proc. Date: 10/08/99 Adm. Date:  [id number] Attending:  Deniece Ree                           Operative Report  PREOPERATIVE DIAGNOSIS:       Intrauterine pregnancy at approximately [redacted] weeks gestation with complete placenta previa; previous cesarean section.  POSTOPERATIVE DIAGNOSIS:      Intrauterine pregnancy at approximately [redacted] weeks gestation with complete placenta previa, previous cesarean section.  Plus a viable female infant with Apgars 9 and 9.  OPERATION:                    Repeat cesarean section.  SURGEON:                      Deniece Ree, M.D.  ASSISTANT:                    Kathreen Cosier, M.D.  PEDIATRICS:                   Alver Sorrow. Mikle Bosworth, M.D.  ANESTHESIA:                   Spinal.  ESTIMATED BLOOD LOSS:         1000 cc.  DRAINS:                       Foley was left to straight drainage.  CONDITION:                    The patient tolerated the procedure well and returned to the recovery room in satisfactory condition.  DESCRIPTION OF PROCEDURE:     The patient was taken to the operating room and prepped and draped in the usual fashion for a repeat cesarean section.  A low Pfannenstiel incision was made.  This was carried down through the fascia at which time the fascia was entered and excised the extent of the incision.  The midline was identified and the rectus muscles were separated.  The abdominal peritoneum was then entered in a vertical fashion using Metzenbaum scissors.  The visceroperitoneum was then excised bilaterally toward the round ligaments following which the lower uterine segment was scored, entered in the midline, and bluntly  dissected open.  The right hand was introduced and with moderate amount of bleeding, removal of a small amount of placenta that was covering the area, the  infants head was delivered with  the use of a Mity-Vac.  There was a cord around  the infants neck x 1 and this was removed without any problems.  Complete delivery was carried out without any problems.  The nasal and pharynx were then sucked out with the suction bulb.  The cord was clamped and the infant turned over to the pediatricians who were in attendance.  Cord blood was then obtained following which the placenta as well as all products of conception were then manually removed from the uterine cavity.  IV Pitocin as well as IV antibiotics were then begun.  The  myometrium was then closed using #1 chromic in a running locking stitch followed by an imbricating stitch again using #1 chromic.  Reperitonealization  was carired ut using 2-0 chromic in a running stitch.  The sponge, needle, and instrument counts were correct x 2.  Hemostasis was present.  Tubes and ovaries bilaterally appeared to be within normal limits.  The abdominal peritoneum was then closed using 2-0  chromic in a running stitch followed by closure of the fascia using #1 Dexon in a running stitch.  Subcutaneous stitch of 2-0 plain catgut interrupted were used o approximate the skin and the skin was closed with skin staples.  The procedure as then terminated and the patient tolerated the procedure well and returned to the recovery room in satisfactory condition. DD:  10/08/99 TD:  10/10/99 Job: [id number]

## 2010-12-06 ENCOUNTER — Emergency Department (HOSPITAL_COMMUNITY): Payer: Medicare Other

## 2010-12-06 ENCOUNTER — Emergency Department (HOSPITAL_COMMUNITY)
Admission: EM | Admit: 2010-12-06 | Discharge: 2010-12-07 | Disposition: A | Discharge status: Home / Self Care | Payer: Medicare Other | Attending: Emergency Medicine | Admitting: Emergency Medicine

## 2010-12-06 ENCOUNTER — Encounter: Payer: Self-pay | Admitting: *Deleted

## 2010-12-06 DIAGNOSIS — M545 Low back pain, unspecified: Secondary | ICD-10-CM | POA: Insufficient documentation

## 2010-12-06 DIAGNOSIS — F17200 Nicotine dependence, unspecified, uncomplicated: Secondary | ICD-10-CM | POA: Insufficient documentation

## 2010-12-06 DIAGNOSIS — W010XXA Fall on same level from slipping, tripping and stumbling without subsequent striking against object, initial encounter: Secondary | ICD-10-CM | POA: Insufficient documentation

## 2010-12-06 DIAGNOSIS — S300XXA Contusion of lower back and pelvis, initial encounter: Secondary | ICD-10-CM | POA: Insufficient documentation

## 2010-12-06 DIAGNOSIS — I10 Essential (primary) hypertension: Secondary | ICD-10-CM | POA: Insufficient documentation

## 2010-12-06 DIAGNOSIS — M25559 Pain in unspecified hip: Secondary | ICD-10-CM | POA: Insufficient documentation

## 2010-12-06 HISTORY — DX: Essential (primary) hypertension: I10

## 2010-12-06 MED ORDER — CYCLOBENZAPRINE HCL 10 MG PO TABS: 10.0000 mg | ORAL_TABLET | Freq: Three times a day (TID) | ORAL | Status: AC | PRN

## 2010-12-06 MED ORDER — OXYCODONE-ACETAMINOPHEN 5-325 MG PO TABS: 1.0000 | ORAL_TABLET | ORAL | Status: AC | PRN

## 2010-12-06 MED ORDER — OXYCODONE-ACETAMINOPHEN 5-325 MG PO TABS
ORAL_TABLET | ORAL | Status: AC
  Administered 2010-12-07: 1
  Filled 2010-12-06: qty 1

## 2010-12-06 MED ORDER — OXYCODONE-ACETAMINOPHEN 5-325 MG PO TABS
1.0000 | ORAL_TABLET | Freq: Once | ORAL | Status: AC
  Administered 2010-12-06: 1 via ORAL
  Filled 2010-12-06: qty 1

## 2010-12-06 MED ORDER — HYDROMORPHONE HCL 1 MG/ML IJ SOLN
1.0000 mg | Freq: Once | INTRAMUSCULAR | Status: AC
  Administered 2010-12-06: 1 mg via INTRAMUSCULAR
  Filled 2010-12-06: qty 1

## 2010-12-06 MED ORDER — OXYCODONE-ACETAMINOPHEN 5-325 MG PO TABS: ORAL_TABLET | ORAL | Status: DC

## 2010-12-06 NOTE — ED Notes (Signed)
Pt called secretary stating they had been forgotten in fast track. I went over and explained to pt that I had been given report on her and new she had just gotten a shot of Dilaudid. Pt asking what they were waiting on and I stated the dr. Quincy Carnes were inquiring about the xrays. i told them I would notify PA

## 2010-12-06 NOTE — ED Provider Notes (Signed)
History     Chief Complaint  Patient presents with  . Fall    Pt fell x 1 week ago c/o tail bone, hip, and lower back pain.  . Tailbone Pain   HPI Comments: Patient c/o pain to her tailbone and bilateral buttocks for one week.  State she slipped, fell at home and landed on her buttocks.  PAin seemed to worsen 1-2 days after the fall and has been persistent since.  She denies numbness, weakness, incontinence of bowel or bladder, and also denies head or neck injury  Patient is a 35 y.o. female presenting with fall. The history is provided by the patient.  Fall The accident occurred more than 2 days ago. The fall occurred while standing. Distance fallen: from a standing position. She landed on a hard floor. There was no blood loss. Point of impact: buttocks. Pain location: buttocks and coccyx. The pain is moderate. She was ambulatory at the scene. There was no entrapment after the fall. There was no drug use involved in the accident. Pertinent negatives include no fever, no numbness, no abdominal pain, no bowel incontinence, no nausea, no vomiting, no hematuria, no headaches, no loss of consciousness and no tingling. The symptoms are aggravated by ambulation. She has tried NSAIDs and acetaminophen for the symptoms. The treatment provided mild relief.    Past Medical History  Diagnosis Date  . Hypertension     Past Surgical History  Procedure Date  . Fracture surgery   . Back surgery     Family History  Problem Relation Age of Onset  . Diabetes Mother   . Hypertension Mother   . Diabetes Father   . Hypertension Father     History  Substance Use Topics  . Smoking status: Current Everyday Smoker  . Smokeless tobacco: Not on file  . Alcohol Use: No    OB History    Grav Para Term Preterm Abortions TAB SAB Ect Mult Living                  Review of Systems  Constitutional: Negative for fever, activity change and appetite change.  HENT: Negative for trouble swallowing, neck  pain and neck stiffness.   Eyes: Negative for pain.  Respiratory: Negative.   Gastrointestinal: Negative for nausea, vomiting, abdominal pain, diarrhea, blood in stool, anal bleeding and bowel incontinence.  Genitourinary: Negative for dysuria, hematuria and pelvic pain.  Musculoskeletal: Positive for myalgias and back pain. Negative for gait problem.  Skin: Negative.   Neurological: Negative for dizziness, tingling, loss of consciousness, weakness, numbness and headaches.  Hematological: Does not bruise/bleed easily.  Psychiatric/Behavioral: Negative.     Physical Exam  BP 107/79  Pulse 112  Temp(Src) 98.8 F (37.1 C) (Oral)  Resp 20  Ht 5\' 9"  (1.753 m)  Wt 250 lb (113.399 kg)  BMI 36.92 kg/m2  SpO2 99%  LMP 11/08/2010  Physical Exam  Constitutional: She is oriented to person, place, and time. She appears well-developed and well-nourished.  Non-toxic appearance.  HENT:  Head: Normocephalic and atraumatic.  Eyes: Pupils are equal, round, and reactive to light.  Neck: Normal range of motion. Neck supple.  Cardiovascular: Normal rate, regular rhythm and normal heart sounds.   Pulmonary/Chest: Effort normal and breath sounds normal.  Abdominal: She exhibits no distension. There is no tenderness. There is no rebound and no guarding.  Musculoskeletal: She exhibits tenderness. She exhibits no edema.       Lumbar back: She exhibits tenderness. She exhibits normal  range of motion, no swelling and no edema.       Back:       ttp over the coccyx and bilateral buttocks.  No edema, erythema, bruising or abrasions  Neurological: She is alert and oriented to person, place, and time.  Skin: Skin is warm and dry.  Psychiatric: She has a normal mood and affect.    ED Course  Procedures  MDM  I have reviewed the imaging results with the pt prior to her d/c.  PAtient is ambulatory.  No focal neuro deficits on exam.  Agrees to f/u with her PMD.      Nanna Ertle L. Manasvi Dickard, [place] 12/06/10  [phone]

## 2010-12-06 NOTE — ED Notes (Signed)
Pt has discomfort/pain in lower back, buttocks and "pain that goes both ways" (to her legs and up spine) after sustaining a fall approx a week prior to visit in ED tonight. Pt states has taken "everything possible over the counter and a friend gave her a lor-tab and tylenol 3 " with no relief. Pt eating during examine and refused to stop eating bugles until gone.

## 2010-12-08 NOTE — ED Provider Notes (Signed)
Medical screening examination/treatment/procedure(s) were performed by non-physician practitioner and as supervising physician I was immediately available for consultation/collaboration.  Hurman Horn, MD 12/08/10 [phone]

## 2011-03-07 ENCOUNTER — Other Ambulatory Visit: Payer: Self-pay

## 2011-03-07 ENCOUNTER — Encounter (HOSPITAL_COMMUNITY): Payer: Self-pay | Admitting: *Deleted

## 2011-03-07 ENCOUNTER — Emergency Department (HOSPITAL_COMMUNITY): Payer: Medicare Other

## 2011-03-07 ENCOUNTER — Emergency Department (HOSPITAL_COMMUNITY)
Admission: EM | Admit: 2011-03-07 | Discharge: 2011-03-07 | Disposition: A | Discharge status: Home / Self Care | Payer: Medicare Other | Attending: Emergency Medicine | Admitting: Emergency Medicine

## 2011-03-07 DIAGNOSIS — Z9889 Other specified postprocedural states: Secondary | ICD-10-CM | POA: Insufficient documentation

## 2011-03-07 DIAGNOSIS — F17200 Nicotine dependence, unspecified, uncomplicated: Secondary | ICD-10-CM | POA: Insufficient documentation

## 2011-03-07 DIAGNOSIS — I10 Essential (primary) hypertension: Secondary | ICD-10-CM | POA: Insufficient documentation

## 2011-03-07 DIAGNOSIS — M545 Low back pain, unspecified: Secondary | ICD-10-CM | POA: Insufficient documentation

## 2011-03-07 LAB — URINE MICROSCOPIC-ADD ON

## 2011-03-07 LAB — URINALYSIS, ROUTINE W REFLEX MICROSCOPIC
Bilirubin Urine: NEGATIVE
Glucose, UA: NEGATIVE mg/dL
pH: 6.5 (ref 5.0–8.0)

## 2011-03-07 LAB — PREGNANCY, URINE: Preg Test, Ur: NEGATIVE

## 2011-03-07 MED ORDER — OXYCODONE-ACETAMINOPHEN 5-325 MG PO TABS
1.0000 | ORAL_TABLET | Freq: Once | ORAL | Status: AC
  Administered 2011-03-07: 1 via ORAL
  Filled 2011-03-07: qty 1

## 2011-03-07 MED ORDER — HYDROMORPHONE HCL 2 MG/ML IJ SOLN
2.0000 mg | Freq: Once | INTRAMUSCULAR | Status: AC
  Administered 2011-03-07: 2 mg via INTRAMUSCULAR
  Filled 2011-03-07: qty 1

## 2011-03-07 MED ORDER — OXYCODONE-ACETAMINOPHEN 5-325 MG PO TABS: 1.0000 | ORAL_TABLET | Freq: Four times a day (QID) | ORAL | Status: AC | PRN

## 2011-03-07 MED ORDER — IBUPROFEN 800 MG PO TABS
800.0000 mg | ORAL_TABLET | Freq: Once | ORAL | Status: AC
  Administered 2011-03-07: 800 mg via ORAL
  Filled 2011-03-07: qty 1

## 2011-03-07 MED ORDER — DIAZEPAM 5 MG PO TABS
10.0000 mg | ORAL_TABLET | Freq: Once | ORAL | Status: AC
  Administered 2011-03-07: 10 mg via ORAL
  Filled 2011-03-07: qty 2

## 2011-03-07 MED ORDER — CYCLOBENZAPRINE HCL 10 MG PO TABS: 10.0000 mg | ORAL_TABLET | Freq: Two times a day (BID) | ORAL | Status: AC | PRN

## 2011-03-07 NOTE — ED Notes (Signed)
Pt states mid to upper back pain x 2 days. Pain is sharp and intermittent. Pain sometimes radiates to shoulders. No known injury.

## 2011-03-07 NOTE — ED Provider Notes (Addendum)
History     CSN: [id number] Arrival date & time: 03/07/2011  3:15 PM  Chief Complaint  Patient presents with  . Back Pain    (Consider location/radiation/quality/duration/timing/severity/associated sxs/prior treatment) HPI Patient presents with complaint of mid to lower back pain. States the pain started 2 days ago and has gradually been worsening. Pain is primarily in lower back on both right and left side but somewhat worse on the right. No weakness of legs no fevers no urinary symptoms. No retention of urine or incontinence of bowel or bladder. Patient denies any fall or injury. Has not tried anything for the pain. Pain is worse with movement. There are no other associated signs or symptoms. Of note, Pt denies having any history of back pain in the past, but per chart review has hx of back surgery and midline surgical incision on exam.   Past Medical History  Diagnosis Date  . Hypertension     Past Surgical History  Procedure Date  . Fracture surgery   . Back surgery     Family History  Problem Relation Age of Onset  . Diabetes Mother   . Hypertension Mother   . Diabetes Father   . Hypertension Father     History  Substance Use Topics  . Smoking status: Current Everyday Smoker  . Smokeless tobacco: Not on file  . Alcohol Use: No    OB History    Grav Para Term Preterm Abortions TAB SAB Ect Mult Living                  Review of Systems ROS reviewed and otherwise negative except for mentioned in HPI  Allergies  Codeine and Toradol  Home Medications   Current Outpatient Rx  Name Route Sig Dispense Refill  . BENAZEPRIL-HYDROCHLOROTHIAZIDE 20-25 MG PO TABS Oral Take 1 tablet by mouth daily.      . OXYCODONE-ACETAMINOPHEN 5-325 MG PO TABS  Take one-two tablets every 4-6 hrs prn pain 20 tablet 0    BP 145/93  Pulse 107  Temp(Src) 98.5 F (36.9 C) (Oral)  Resp 20  Ht 5\' 9"  (1.753 m)  Wt 240 lb (108.863 kg)  BMI 35.44 kg/m2  SpO2 99%  LMP  03/01/2011 Vitals reviewed, mild tachycardia Physical Exam Physical Examination: General appearance - alert, well appearing, and in no distress Mental status - alert, oriented to person, place, and time Mouth - mucous membranes moist, pharynx normal without lesions Neck - supple, no significant adenopathy, nontender, FROM Chest - clear to auscultation, no wheezes, rales or rhonchi, symmetric air entry Heart - normal rate, regular rhythm, normal S1, S2, no murmurs, rubs, clicks or gallops Abdomen - soft, nontender, nondistended, no masses or organomegaly Back exam - mild ttp over lumbar spine, paraspinous tenderness bilaterally, right greater than left, well healed midline vertical incision Neurological - alert, oriented, normal speech, no focal findings or movement disorder noted, motor and sensory grossly normal bilaterally, normal muscle tone, no tremors, strength 5/5 Musculoskeletal - no joint tenderness, deformity or swelling Extremities - peripheral pulses normal, no pedal edema, no clubbing or cyanosis Skin - normal coloration and turgor, no rashes, no suspicious skin lesions noted  ED Course  Procedures (including critical care time) Pt with mild ttp over lumbar spine, no signs or symptoms of cauda equina or infection.  No hx of trauma.  xrays ordered as well as urine and will treat with pain control, anti-inflammatory and muscle relaxer.  Pt agreeable with this plan.   5:14 PM  Urine and xrays reassuring- stable post surgical changes, pt requested more pain medication.  Given dilaudid IM x 1.  Discharged with strict return precautions, pt agreeable with plan.   EKG ordered from triage.. Pt had no complaints of chest pain or sob during my evaluation, pain was musculoskeletal and reproducible on exam in low back  Date: 03/14/2011  Rate:79  Rhythm: normal sinus rhythm  QRS Axis: right  Intervals: lpfb  ST/T Wave abnormalities: nonspecific t wave abnormalities  Conduction  Disutrbances:left posterior fascicular block  Narrative Interpretation:   Old EKG Reviewed: t wave changes are new   Labs Reviewed  PREGNANCY, URINE  URINALYSIS, ROUTINE W REFLEX MICROSCOPIC   No results found.   No diagnosis found.    MDM  Low back pain, chronic s/p lumbar surgery.  No signs or symptoms of cauda equina.         Ethelda Chick, MD 03/07/11 1720  Ethelda Chick, MD 03/14/11 [phone]

## 2011-04-27 ENCOUNTER — Emergency Department (HOSPITAL_COMMUNITY): Payer: Medicare Other

## 2011-04-27 ENCOUNTER — Emergency Department (HOSPITAL_COMMUNITY)
Admission: EM | Admit: 2011-04-27 | Discharge: 2011-04-27 | Disposition: A | Discharge status: Home / Self Care | Payer: Medicare Other | Attending: Emergency Medicine | Admitting: Emergency Medicine

## 2011-04-27 ENCOUNTER — Encounter (HOSPITAL_COMMUNITY): Payer: Self-pay | Admitting: *Deleted

## 2011-04-27 DIAGNOSIS — M546 Pain in thoracic spine: Secondary | ICD-10-CM | POA: Insufficient documentation

## 2011-04-27 DIAGNOSIS — F17200 Nicotine dependence, unspecified, uncomplicated: Secondary | ICD-10-CM | POA: Insufficient documentation

## 2011-04-27 DIAGNOSIS — I10 Essential (primary) hypertension: Secondary | ICD-10-CM | POA: Insufficient documentation

## 2011-04-27 DIAGNOSIS — W1809XA Striking against other object with subsequent fall, initial encounter: Secondary | ICD-10-CM | POA: Insufficient documentation

## 2011-04-27 DIAGNOSIS — S20229A Contusion of unspecified back wall of thorax, initial encounter: Secondary | ICD-10-CM

## 2011-04-27 MED ORDER — OXYCODONE-ACETAMINOPHEN 5-325 MG PO TABS
1.0000 | ORAL_TABLET | Freq: Once | ORAL | Status: AC
  Administered 2011-04-27: 1 via ORAL
  Filled 2011-04-27: qty 1

## 2011-04-27 MED ORDER — IBUPROFEN 800 MG PO TABS
800.0000 mg | ORAL_TABLET | Freq: Once | ORAL | Status: AC
  Administered 2011-04-27: 800 mg via ORAL
  Filled 2011-04-27: qty 1

## 2011-04-27 MED ORDER — HYDROCODONE-ACETAMINOPHEN 5-325 MG PO TABS: ORAL_TABLET | ORAL | Status: DC

## 2011-04-27 NOTE — ED Provider Notes (Signed)
History     CSN: [id number] Arrival date & time: 04/27/2011  8:46 PM   First MD Initiated Contact with Patient 04/27/11 2058      No chief complaint on file.   (Consider location/radiation/quality/duration/timing/severity/associated sxs/prior treatment) HPI Comments: Pt states she tripped and fell backwards striking her thoracic spine on the edge of a bar counter top.  No other injuries.  The history is provided by the patient. No language interpreter was used.    Past Medical History  Diagnosis Date  . Hypertension     Past Surgical History  Procedure Date  . Fracture surgery   . Back surgery     Family History  Problem Relation Age of Onset  . Diabetes Mother   . Hypertension Mother   . Diabetes Father   . Hypertension Father     History  Substance Use Topics  . Smoking status: Current Everyday Smoker  . Smokeless tobacco: Not on file  . Alcohol Use: No    OB History    Grav Para Term Preterm Abortions TAB SAB Ect Mult Living                  Review of Systems  Musculoskeletal: Positive for back pain.  All other systems reviewed and are negative.    Allergies  Toradol  Home Medications   Current Outpatient Rx  Name Route Sig Dispense Refill  . HYDROCHLOROTHIAZIDE 25 MG PO TABS Oral Take 25 mg by mouth daily.      Carma Leaven M PLUS PO TABS Oral Take 1 tablet by mouth daily.        BP 127/85  Pulse 120  Temp(Src) 98.6 F (37 C) (Oral)  Resp 20  Ht 5\' 9"  (1.753 m)  Wt 243 lb (110.224 kg)  BMI 35.88 kg/m2  SpO2 100%  LMP 04/04/2011  Physical Exam  Nursing note and vitals reviewed. Constitutional: She is oriented to person, place, and time. She appears well-developed and well-nourished. No distress.  HENT:  Head: Normocephalic and atraumatic.  Eyes: EOM are normal.  Neck: Normal range of motion.  Cardiovascular: Normal rate, regular rhythm and normal heart sounds.   Pulmonary/Chest: Effort normal and breath sounds normal.  Abdominal:  Soft. She exhibits no distension. There is no tenderness.  Musculoskeletal: She exhibits tenderness.       Thoracic back: She exhibits decreased range of motion, tenderness, bony tenderness and pain. She exhibits no swelling, no edema, no deformity, no laceration, no spasm and normal pulse.       Back:  Neurological: She is alert and oriented to person, place, and time.  Skin: Skin is warm and dry.  Psychiatric: She has a normal mood and affect. Judgment normal.    ED Course  Procedures (including critical care time)  Labs Reviewed - No data to display No results found.   No diagnosis found.    MDM          Worthy Rancher, PA 04/27/11 [phone]

## 2011-04-27 NOTE — ED Provider Notes (Signed)
Medical screening examination/treatment/procedure(s) were performed by non-physician practitioner and as supervising physician I was immediately available for consultation/collaboration.   Benny Lennert, MD 04/27/11 [phone]

## 2011-07-19 IMAGING — CR DG SACRUM/COCCYX 2+V
4 series · 4 of 4 positions shown · non-contrast
Comparison: Lumbar MRI 09/09/2005.

CLINICAL DATA: 35-year-old female with pain status post fall.

SACRUM AND COCCYX - 2+ VIEW

[view not recorded (1 of 4)]
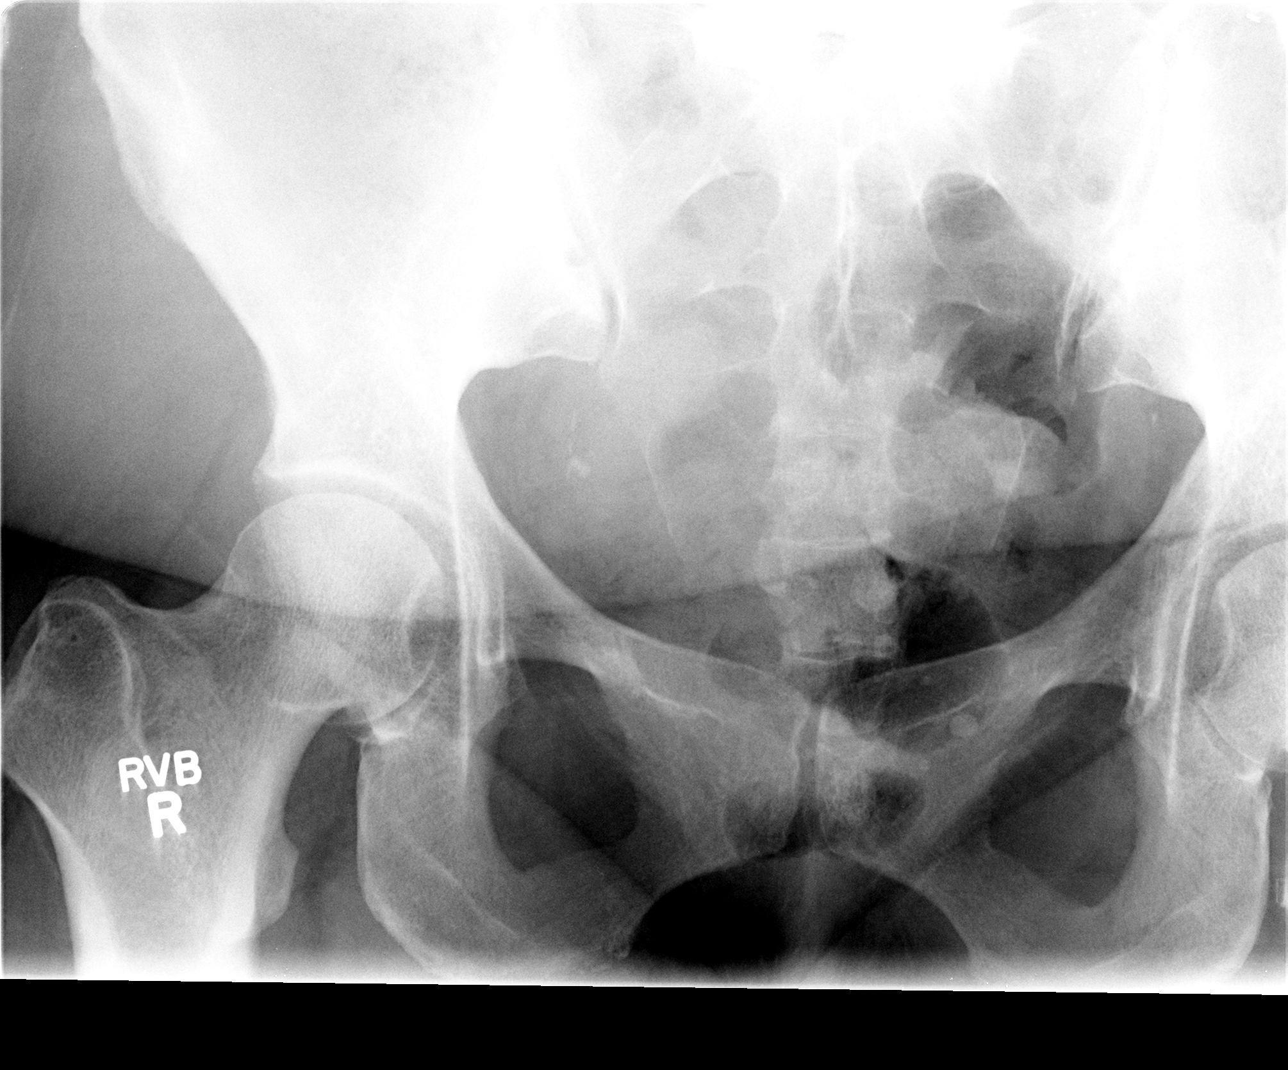

[view not recorded (2 of 4)]
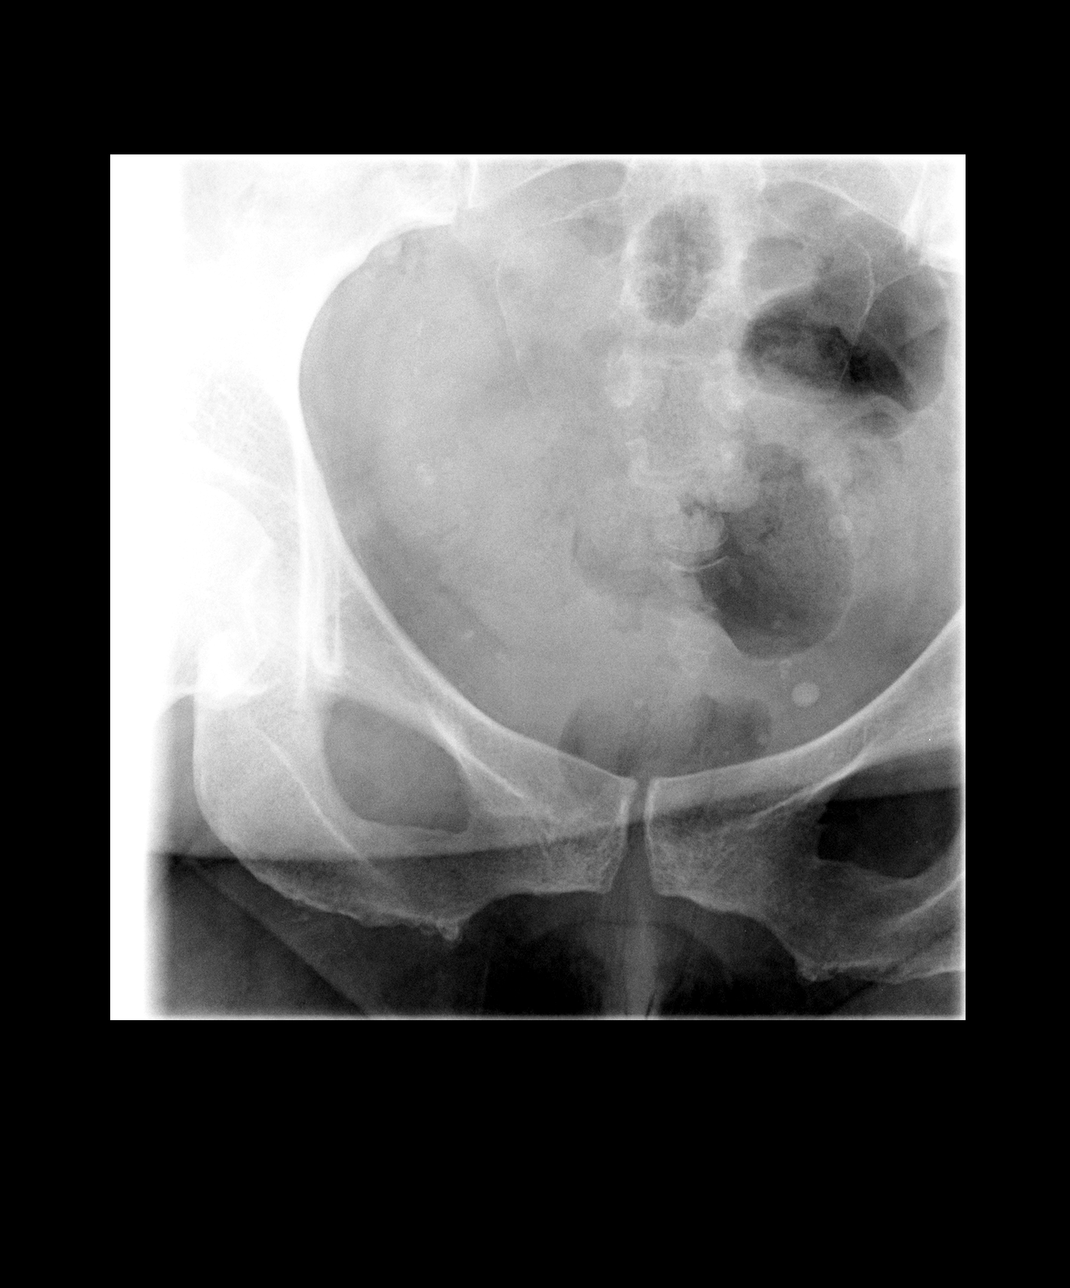

[view not recorded (3 of 4)]
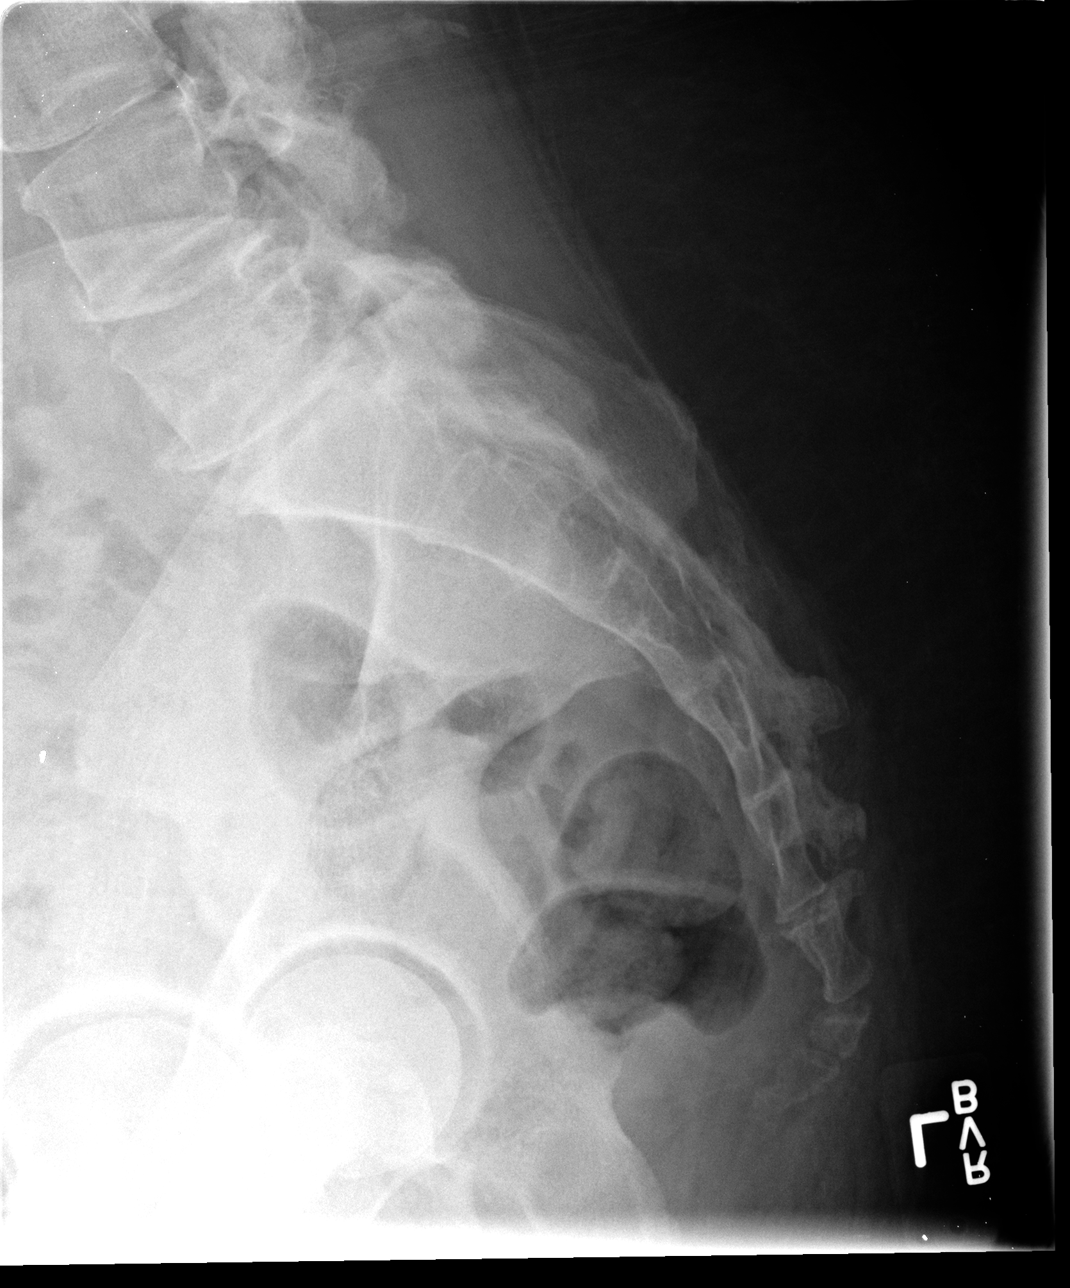

[view not recorded (4 of 4)]
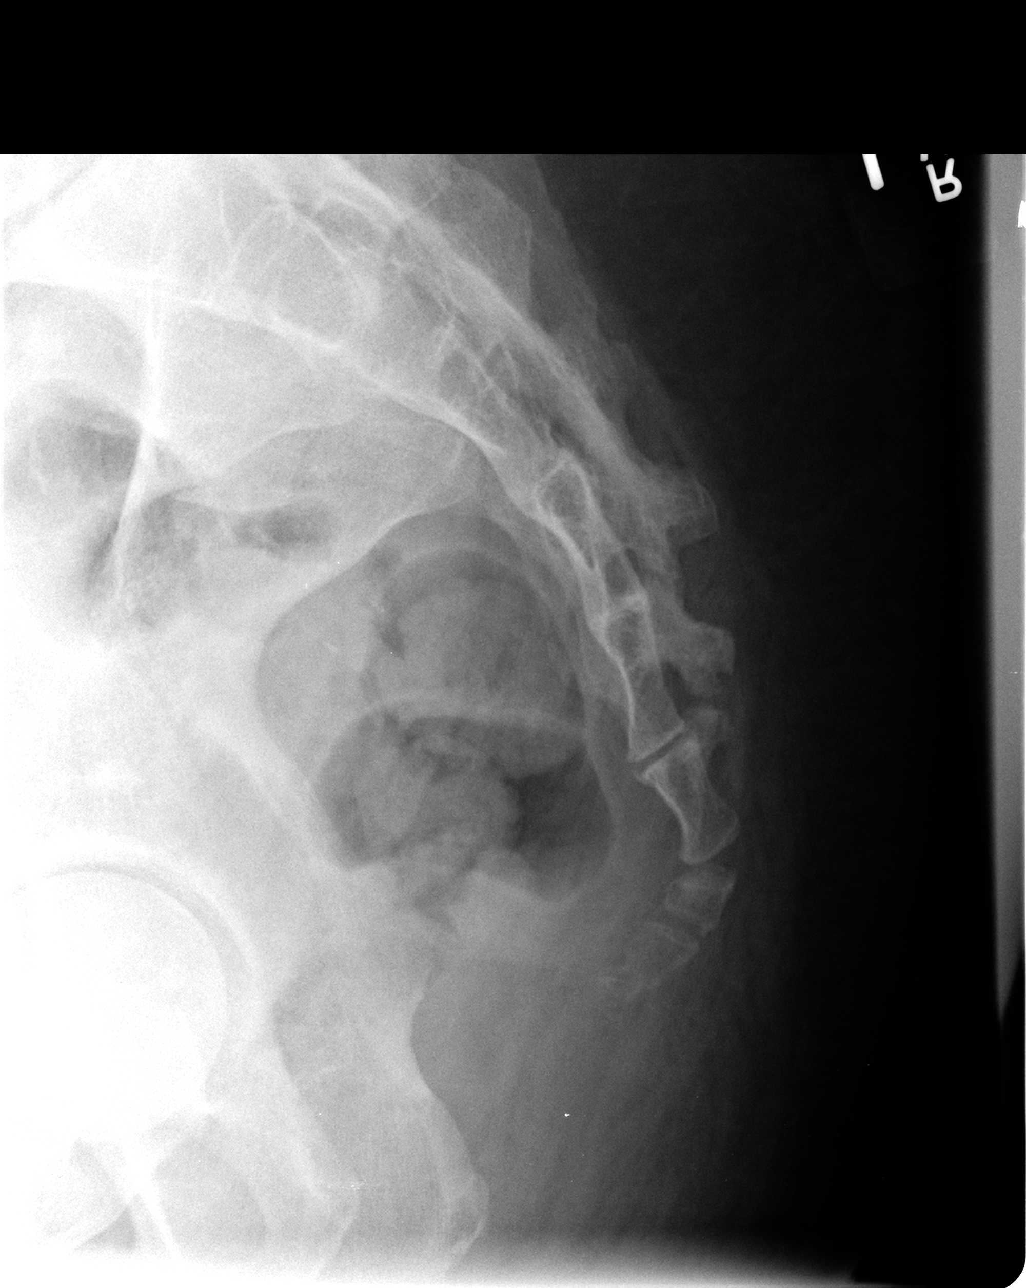

[4 of 4 positions shown; findings below may reference images not displayed]

FINDINGS: Sacral segments appear stable and intact.  SI joints
within normal limits.  On the lateral view there is mild angulation
of the coccyx.  No associated coccygeal fracture identified.  Lower
lumbar disc and facet degeneration.
IMPRESSION: No acute fracture or dislocation identified about the sacrum or
coccyx.

## 2011-10-18 IMAGING — CR DG LUMBAR SPINE COMPLETE 4+V
5 series · 5 of 5 positions shown · non-contrast
Comparison: Lumbar MRI 09/09/2005.

CLINICAL DATA: Mid to low back pain.  No known injury.

LUMBAR SPINE - COMPLETE 4+ VIEW

[view not recorded (1 of 5)]
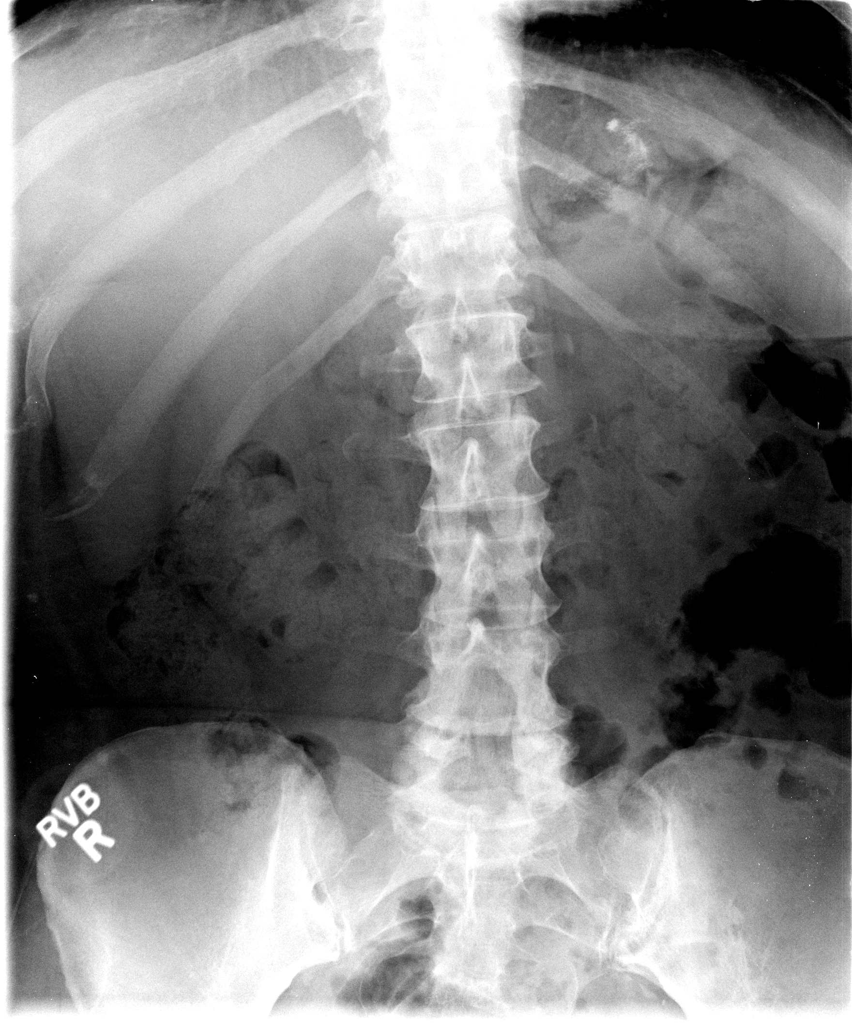

[view not recorded (2 of 5)]
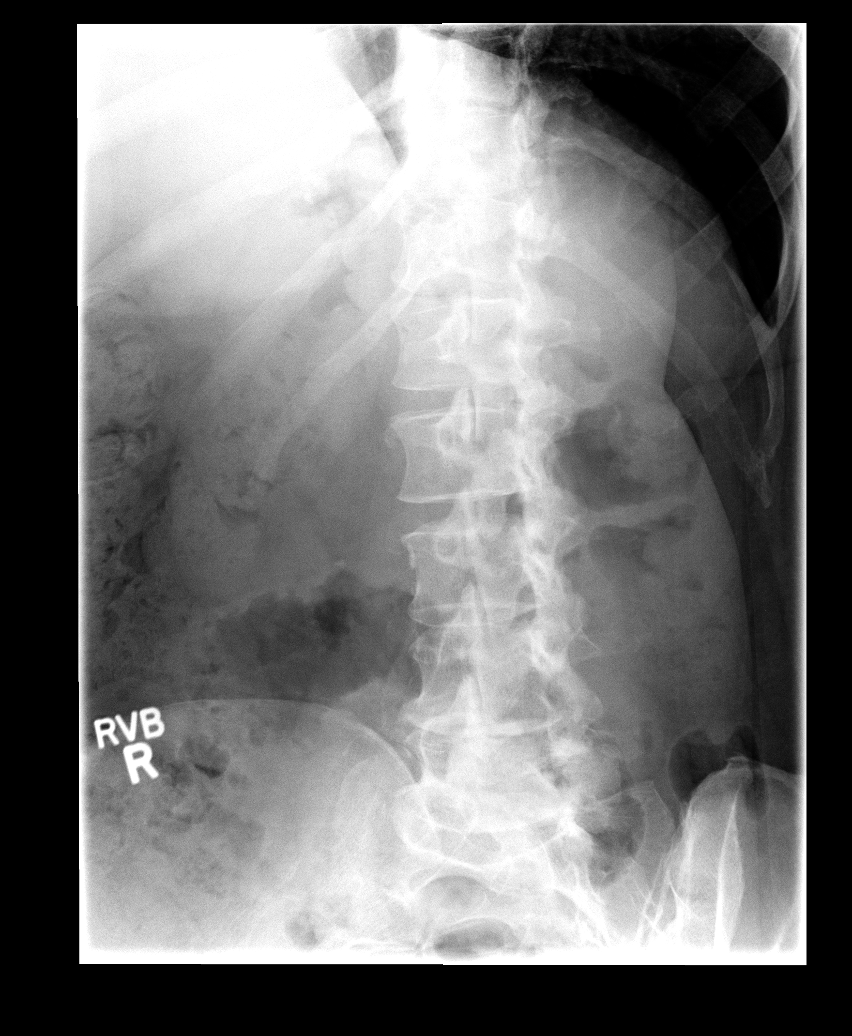

[view not recorded (3 of 5)]
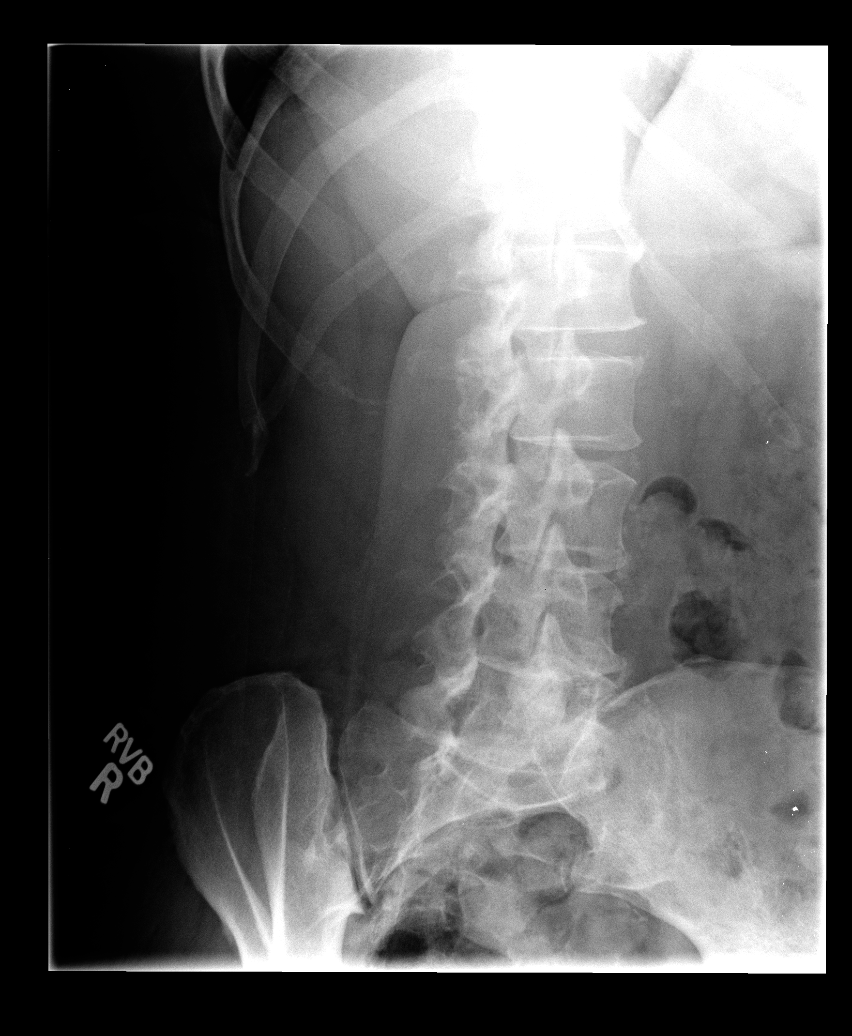

[view not recorded (4 of 5)]
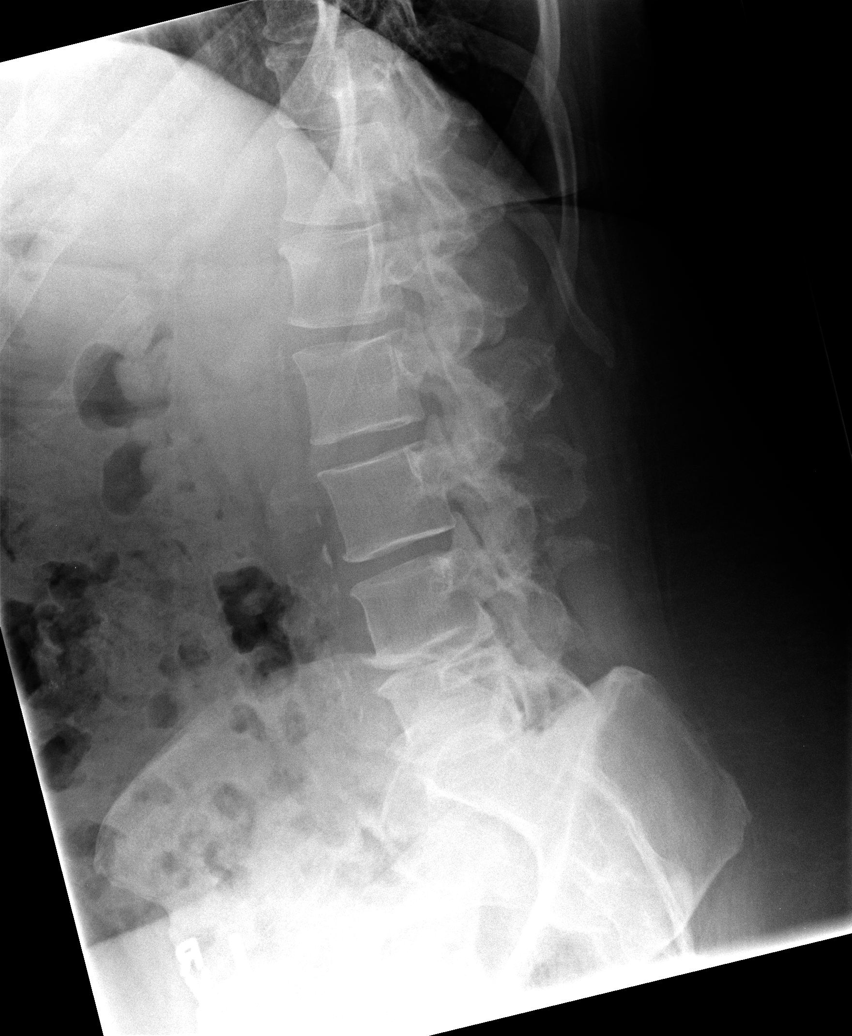

[view not recorded (5 of 5)]
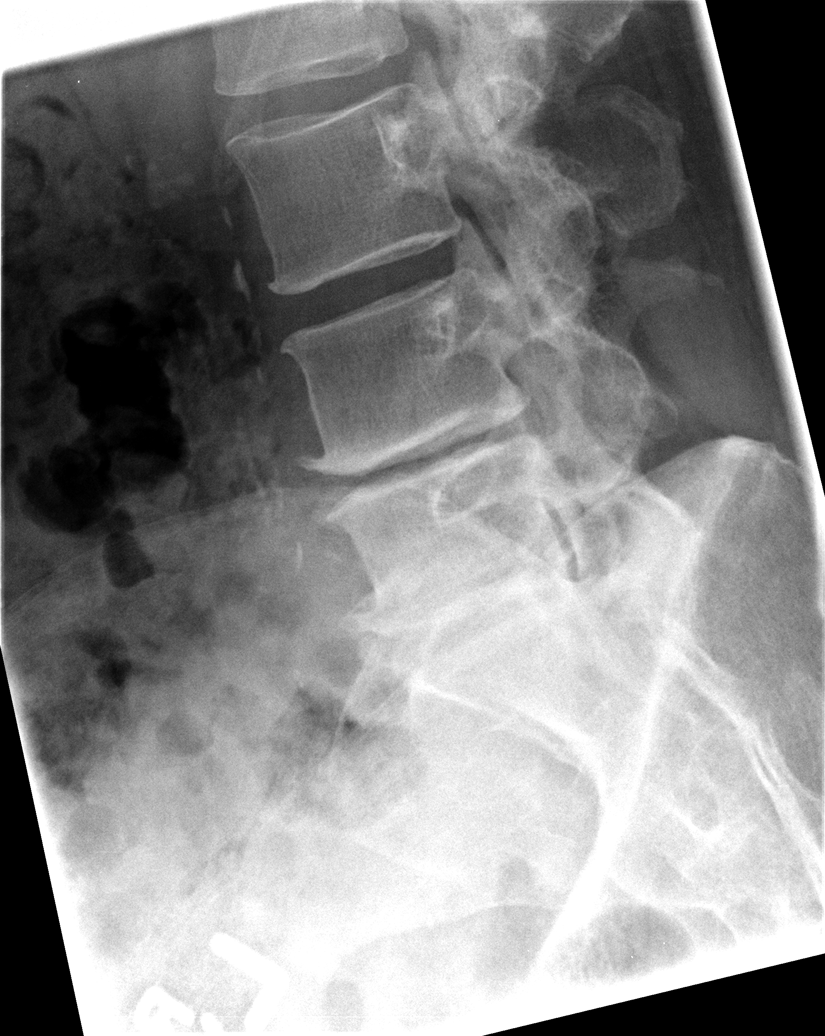

[5 of 5 positions shown; findings below may reference images not displayed]

FINDINGS: There are five lumbar type vertebral bodies.  The
alignment is normal.  There is disc space loss at L4-L5 and L5-S1
status post bilateral laminectomy.  Intervertebral spurring and
facet hypertrophy are present at those levels.  There is no
evidence of acute fracture or pars defect.
IMPRESSION: Stable lower lumbar spondylosis and postsurgical changes.  No acute
findings identified.

## 2011-12-08 IMAGING — CR DG THORACIC SPINE 3V
3 series · 3 of 3 positions shown · non-contrast
Comparison: Chest radiograph performed 10/13/2003

CLINICAL DATA: Status post fall; hit upper back on edge of kitchen
counter, with upper back pain.

THORACIC SPINE - 2 VIEW + SWIMMERS

[view not recorded (1 of 3)]
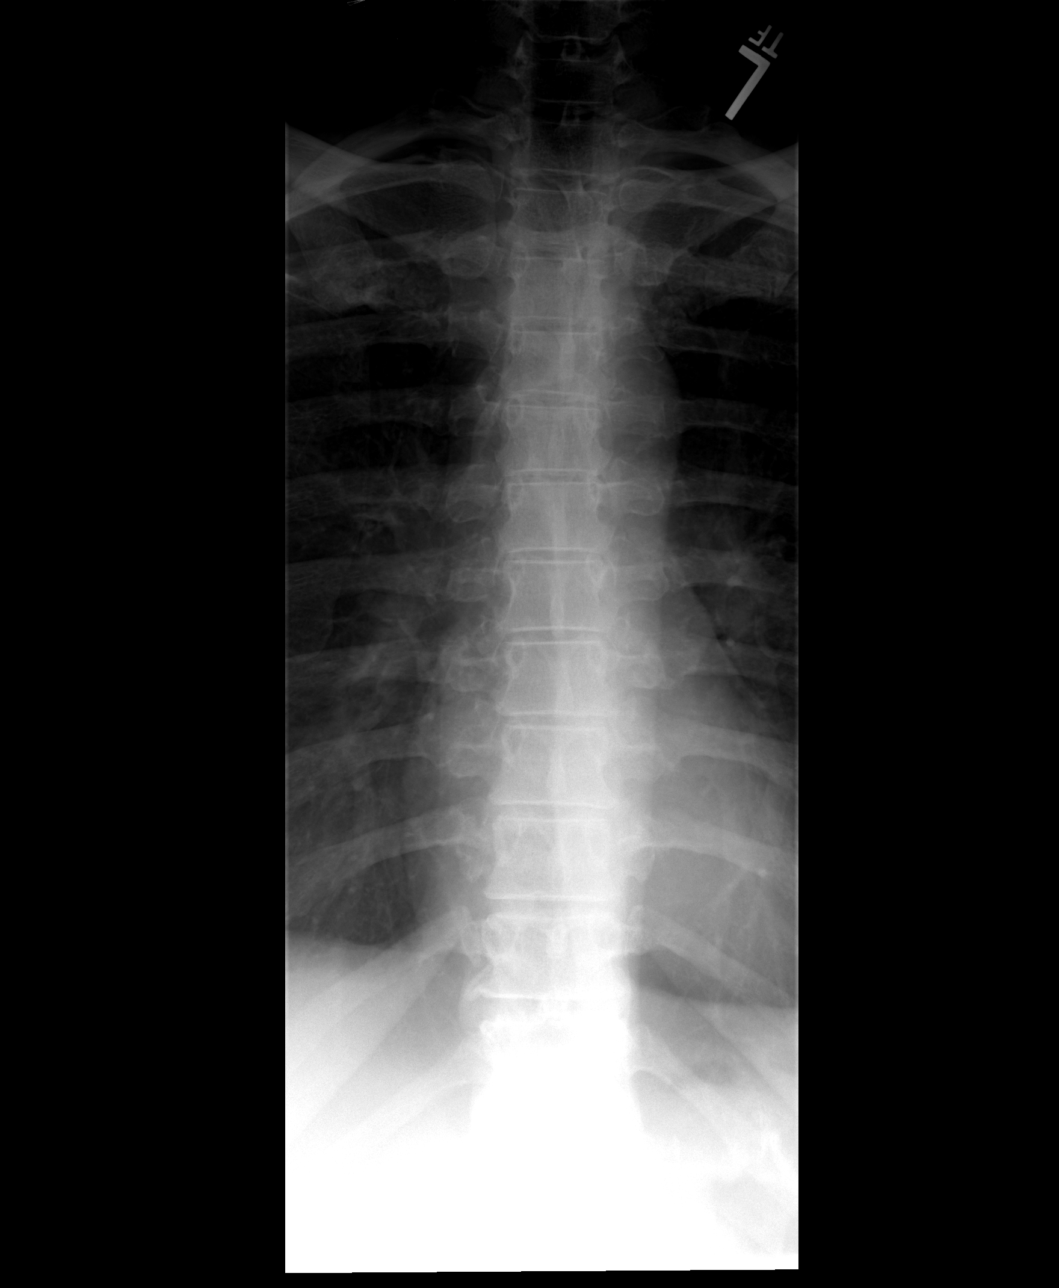

[view not recorded (2 of 3)]
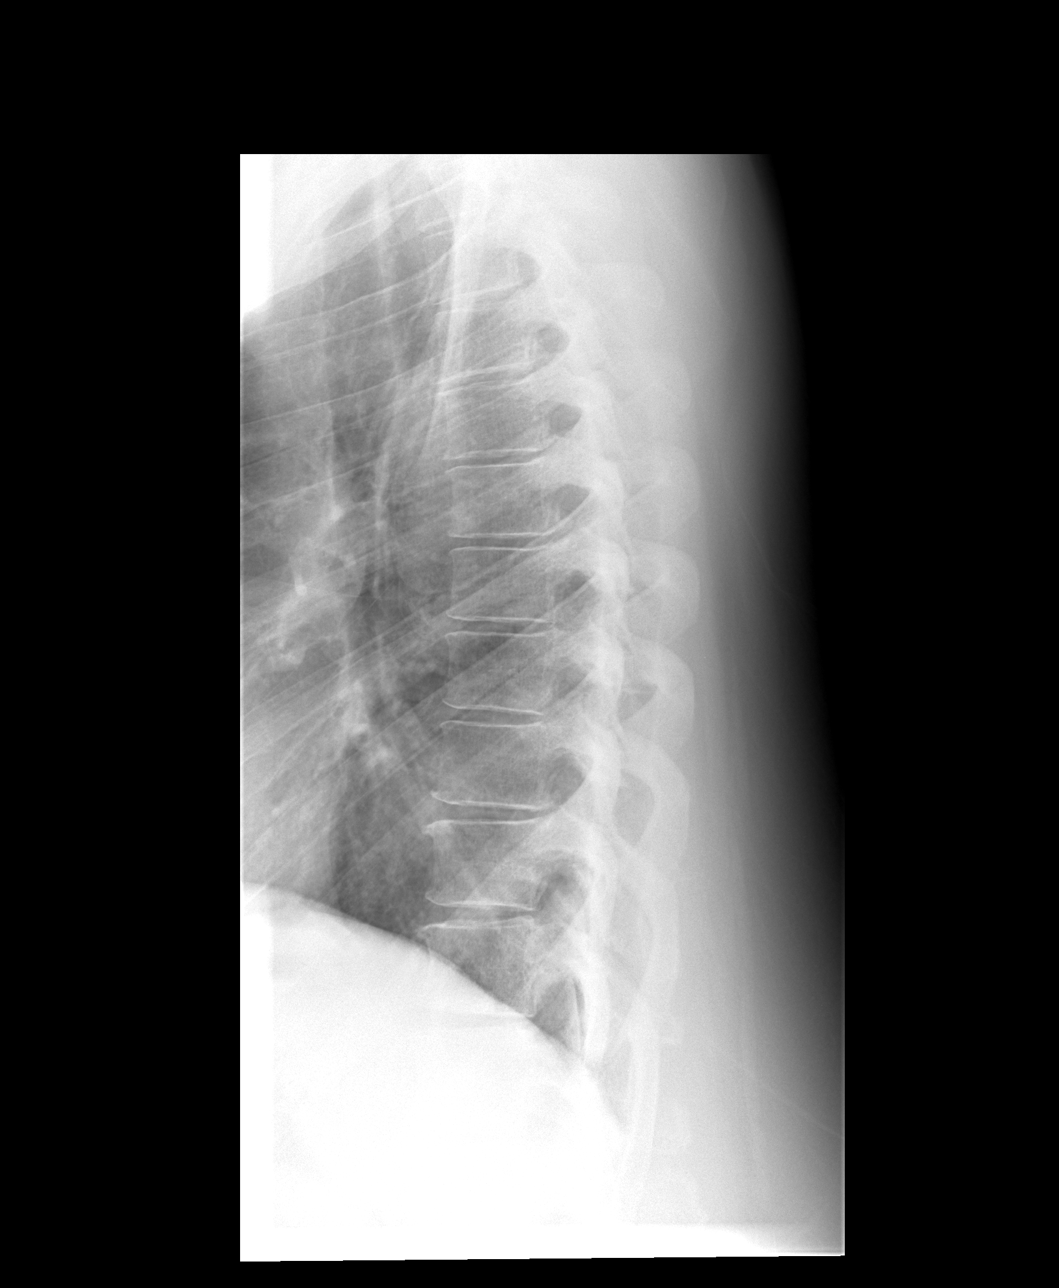

[view not recorded (3 of 3)]
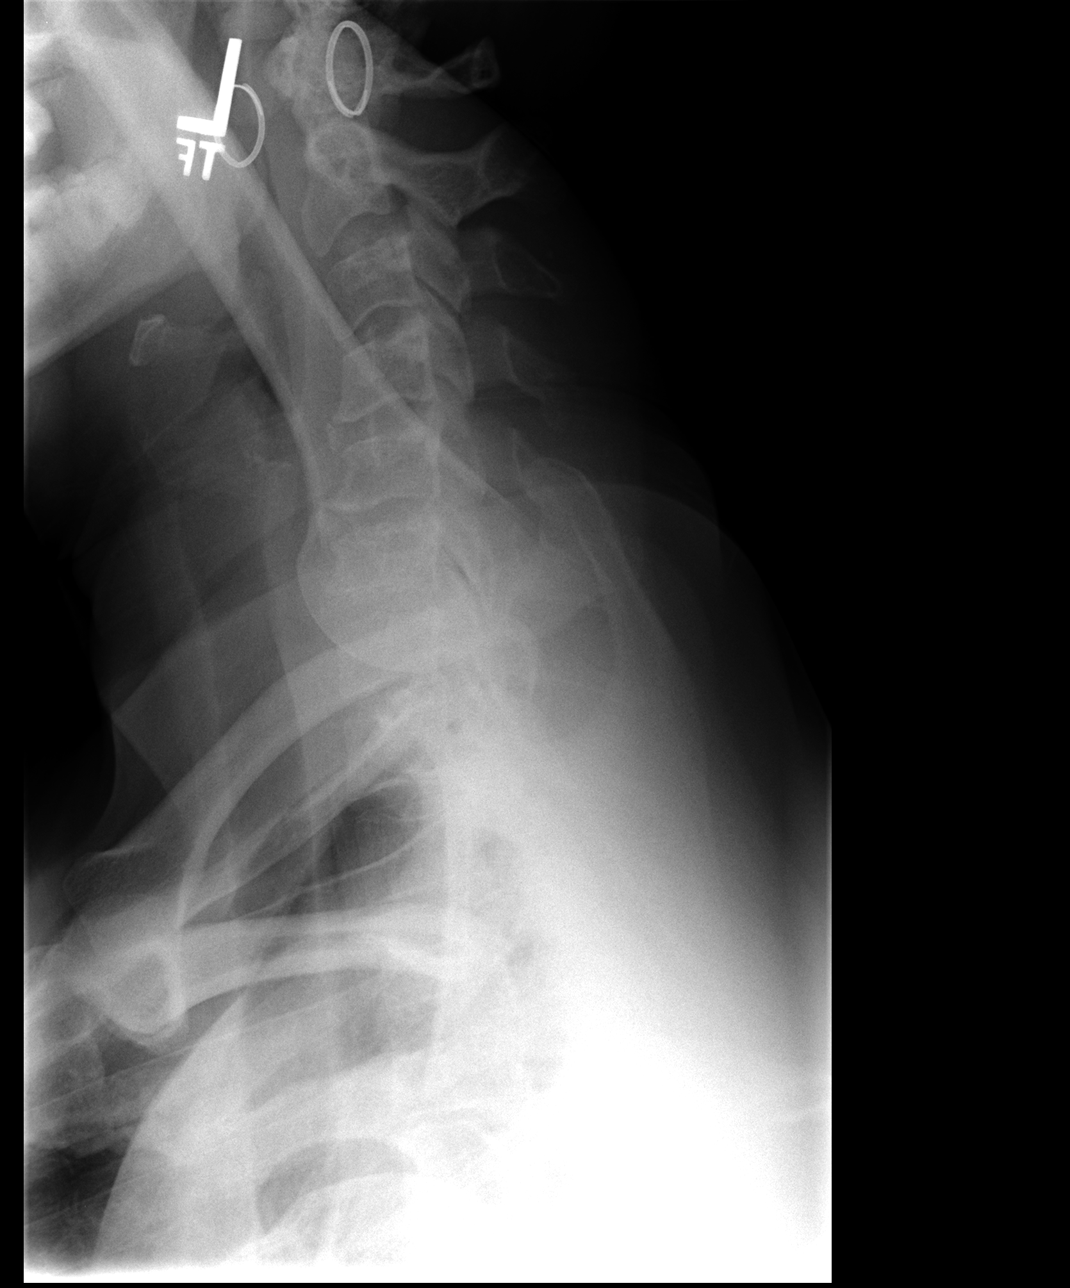

[3 of 3 positions shown; findings below may reference images not displayed]

FINDINGS: There is no evidence of fracture or subluxation.
Vertebral bodies demonstrate normal height and alignment.
Intervertebral disc spaces are preserved.

The visualized portions of both lungs are clear.  The mediastinum
is unremarkable in appearance.
IMPRESSION: No evidence of fracture or subluxation along the thoracic spine.

## 2012-02-13 ENCOUNTER — Encounter (HOSPITAL_COMMUNITY): Payer: Self-pay

## 2012-02-13 ENCOUNTER — Emergency Department (HOSPITAL_COMMUNITY)
Admission: EM | Admit: 2012-02-13 | Discharge: 2012-02-13 | Disposition: A | Discharge status: Home / Self Care | Payer: Medicare Other | Attending: Emergency Medicine | Admitting: Emergency Medicine

## 2012-02-13 DIAGNOSIS — I10 Essential (primary) hypertension: Secondary | ICD-10-CM | POA: Insufficient documentation

## 2012-02-13 DIAGNOSIS — Z833 Family history of diabetes mellitus: Secondary | ICD-10-CM | POA: Insufficient documentation

## 2012-02-13 DIAGNOSIS — M5432 Sciatica, left side: Secondary | ICD-10-CM

## 2012-02-13 DIAGNOSIS — M5430 Sciatica, unspecified side: Secondary | ICD-10-CM | POA: Insufficient documentation

## 2012-02-13 DIAGNOSIS — Z888 Allergy status to other drugs, medicaments and biological substances status: Secondary | ICD-10-CM | POA: Insufficient documentation

## 2012-02-13 DIAGNOSIS — Z8249 Family history of ischemic heart disease and other diseases of the circulatory system: Secondary | ICD-10-CM | POA: Insufficient documentation

## 2012-02-13 DIAGNOSIS — G8929 Other chronic pain: Secondary | ICD-10-CM | POA: Insufficient documentation

## 2012-02-13 DIAGNOSIS — F17200 Nicotine dependence, unspecified, uncomplicated: Secondary | ICD-10-CM | POA: Insufficient documentation

## 2012-02-13 HISTORY — DX: Dorsalgia, unspecified: M54.9

## 2012-02-13 HISTORY — DX: Other chronic pain: G89.29

## 2012-02-13 MED ORDER — METHOCARBAMOL 500 MG PO TABS: 1000.0000 mg | ORAL_TABLET | Freq: Four times a day (QID) | ORAL | Status: AC

## 2012-02-13 MED ORDER — HYDROMORPHONE HCL PF 1 MG/ML IJ SOLN
2.0000 mg | Freq: Once | INTRAMUSCULAR | Status: AC
  Administered 2012-02-13: 2 mg via INTRAMUSCULAR
  Filled 2012-02-13: qty 2

## 2012-02-13 MED ORDER — HYDROCODONE-ACETAMINOPHEN 5-325 MG PO TABS: 1.0000 | ORAL_TABLET | ORAL | Status: AC | PRN

## 2012-02-13 NOTE — ED Provider Notes (Signed)
History     CSN: [id number]  Arrival date & time 02/13/12  5621   First MD Initiated Contact with Patient 02/13/12 [phone]      Chief Complaint  Patient presents with  . Back Pain    (Consider location/radiation/quality/duration/timing/severity/associated sxs/prior treatment) HPI Comments: Terri [name]  presents with acute on chronic low back pain which has which has been present for the past 6 days.   Patient denies any new injury specifically.  There is radiation of pain into her left leg to her midcalf region which is typical of her acute flare ups.  She does have a history of lumbar diskectomy x 3 with 1 surgery occuring many years ago when living in [place],  And 2 surgeries in [place],  The most recent 4/12.  She is awaiting a  Re-evaluation appointment with her surgeon which has not been scheduled yet.    There has been no weakness or numbness in the lower extremities and no urinary or bowel retention or incontinence.  Patient does not have a history of cancer or IVDU.  She has taken ibuprofen 800 mg this past week without improvement in sx.     The history is provided by the patient and the spouse.    Past Medical History  Diagnosis Date  . Hypertension   . Chronic back pain     Past Surgical History  Procedure Date  . Fracture surgery   . Back surgery     Family History  Problem Relation Age of Onset  . Diabetes Mother   . Hypertension Mother   . Diabetes Father   . Hypertension Father     History  Substance Use Topics  . Smoking status: Current Every Day Smoker    Types: Cigarettes  . Smokeless tobacco: Not on file  . Alcohol Use: No    OB History    Grav Para Term Preterm Abortions TAB SAB Ect Mult Living                  Review of Systems  Constitutional: Negative for fever and chills.  Respiratory: Negative for shortness of breath.   Cardiovascular: Negative for chest pain and leg swelling.  Gastrointestinal: Negative for abdominal pain,  constipation and abdominal distention.  Genitourinary: Negative for dysuria, urgency, frequency, flank pain and difficulty urinating.  Musculoskeletal: Positive for back pain. Negative for joint swelling and gait problem.  Skin: Negative for rash.  Neurological: Negative for weakness and numbness.    Allergies  Ketorolac tromethamine  Home Medications   Current Outpatient Rx  Name Route Sig Dispense Refill  . EXCEDRIN PO Oral Take 2 tablets by mouth as needed. For headache    . IBUPROFEN 200 MG PO TABS Oral Take 1,200-1,600 mg by mouth every 8 (eight) hours as needed. For back pain    . LISINOPRIL 10 MG PO TABS Oral Take 10 mg by mouth daily.    Carma Leaven M PLUS PO TABS Oral Take 1 tablet by mouth daily.      Marland Kitchen HYDROCODONE-ACETAMINOPHEN 5-325 MG PO TABS Oral Take 1 tablet by mouth every 4 (four) hours as needed for pain. 15 tablet 0  . METHOCARBAMOL 500 MG PO TABS Oral Take 2 tablets (1,000 mg total) by mouth 4 (four) times daily. 40 tablet 0    BP 139/99  Pulse 71  Temp 97.6 F (36.4 C) (Oral)  Resp 20  Ht 5\' 9"  (1.753 m)  Wt 250 lb (113.399 kg)  BMI  36.92 kg/m2  SpO2 98%  LMP 02/07/2012  Physical Exam  Nursing note and vitals reviewed. Constitutional: She appears well-developed and well-nourished.  HENT:  Head: Normocephalic.  Eyes: Conjunctivae normal are normal.  Neck: Normal range of motion. Neck supple.  Cardiovascular: Normal rate and intact distal pulses.        Pedal pulses normal.  Pulmonary/Chest: Effort normal.  Abdominal: Soft. Bowel sounds are normal. She exhibits no distension and no mass.  Musculoskeletal: Normal range of motion. She exhibits no edema.       Lumbar back: She exhibits tenderness. She exhibits no swelling, no edema and no spasm.  Neurological: She is alert. She has normal strength. She displays no atrophy and no tremor. No sensory deficit. Gait abnormal.  Reflex Scores:      Patellar reflexes are 2+ on the right side and 2+ on the left  side.      Achilles reflexes are 2+ on the right side and 2+ on the left side.      No strength deficit noted in hip and knee flexor and extensor muscle groups.  Ankle flexion and extension intact.  Pt is ambulatory without foot drop.  She does walk holding her lower back with forward flexion of the back with gait.  Attempts to stand straight worsens pain.  Skin: Skin is warm and dry.  Psychiatric: She has a normal mood and affect.    ED Course  Procedures (including critical care time)  Labs Reviewed - No data to display No results found.   1. Sciatica of left side     Dilaudid 2 mg IM given in ed.  MDM  Acute on chronic low back pain with no new injury.  No neuro deficit on exam or by history to suggest emergent or surgical presentation.  Also discussed worsened sx that should prompt immediate re-evaluation including distal weakness, bowel/bladder retention/incontinence.  Pt encouraged rest,  Avoid activity that worsens pain.  Prescribed robaxin,  Hydrocodone.  Heat therapy.  F/u with back surgeon in [place] and/or pcp in [place] if not improved or if sx worsen.              Burgess Amor, PA 02/13/12 0930  Burgess Amor, PA 02/13/12 [phone]

## 2012-02-13 NOTE — ED Notes (Signed)
Pt reports severe back pain for 2 days, h/o chronic back pain/surgery. Denies any recent injury. Unable to follow up with surgeon in [place]. Has been taking ibu for pain.

## 2012-02-13 NOTE — ED Provider Notes (Signed)
Medical screening examination/treatment/procedure(s) were performed by non-physician practitioner and as supervising physician I was immediately available for consultation/collaboration.   Glynn Octave, MD 02/13/12 [phone]

## 2012-03-22 ENCOUNTER — Encounter (HOSPITAL_COMMUNITY): Payer: Self-pay | Admitting: Emergency Medicine

## 2012-03-22 ENCOUNTER — Emergency Department (HOSPITAL_COMMUNITY)
Admission: EM | Admit: 2012-03-22 | Discharge: 2012-03-23 | Disposition: A | Discharge status: Home / Self Care | Payer: Medicare Other | Attending: Emergency Medicine | Admitting: Emergency Medicine

## 2012-03-22 DIAGNOSIS — I10 Essential (primary) hypertension: Secondary | ICD-10-CM | POA: Insufficient documentation

## 2012-03-22 DIAGNOSIS — F17200 Nicotine dependence, unspecified, uncomplicated: Secondary | ICD-10-CM | POA: Insufficient documentation

## 2012-03-22 DIAGNOSIS — Z79899 Other long term (current) drug therapy: Secondary | ICD-10-CM | POA: Insufficient documentation

## 2012-03-22 DIAGNOSIS — M549 Dorsalgia, unspecified: Secondary | ICD-10-CM | POA: Insufficient documentation

## 2012-03-22 MED ORDER — PREDNISONE 20 MG PO TABS
60.0000 mg | ORAL_TABLET | Freq: Once | ORAL | Status: AC
  Administered 2012-03-22: 60 mg via ORAL
  Filled 2012-03-22: qty 3

## 2012-03-22 MED ORDER — HYDROMORPHONE HCL PF 1 MG/ML IJ SOLN
1.0000 mg | Freq: Once | INTRAMUSCULAR | Status: AC
  Administered 2012-03-22: 1 mg via INTRAMUSCULAR
  Filled 2012-03-22: qty 1

## 2012-03-22 NOTE — ED Provider Notes (Signed)
History     CSN: [id number]  Arrival date & time 03/22/12  2250   First MD Initiated Contact with Patient 03/22/12 2303      Chief Complaint  Patient presents with  . Back Pain    (Consider location/radiation/quality/duration/timing/severity/associated sxs/prior treatment) HPI Comments: Patient states she has chronic back, pain.  She's had several surgeries to her lumbar region.  She states she was due to have additional surgery, but she moved from dental continue to [facility], losing her physician during the, move.  She exacerbated her low back, pain.  She's been taking over-the-counter ibuprofen, without any relief.  It, now radiates down her left leg  Patient is a 36 y.o. female presenting with back pain. The history is provided by the patient.  Back Pain  This is a recurrent problem. The current episode started yesterday. The problem occurs constantly. The problem has been gradually worsening. The pain is associated with lifting heavy objects. Associated symptoms include weakness. Pertinent negatives include no fever.    Past Medical History  Diagnosis Date  . Hypertension   . Chronic back pain     Past Surgical History  Procedure Date  . Fracture surgery   . Back surgery   . Cesarean section   . Gastric bypass   . Ankle surgery     Family History  Problem Relation Age of Onset  . Diabetes Mother   . Hypertension Mother   . Diabetes Father   . Hypertension Father     History  Substance Use Topics  . Smoking status: Current Every Day Smoker -- 1.0 packs/day    Types: Cigarettes  . Smokeless tobacco: Not on file  . Alcohol Use: No    OB History    Grav Para Term Preterm Abortions TAB SAB Ect Mult Living                  Review of Systems  Constitutional: Negative for fever.  Musculoskeletal: Positive for back pain.  Skin: Negative for wound.  Neurological: Positive for weakness. Negative for dizziness.  Psychiatric/Behavioral: The patient is  nervous/anxious.     Allergies  Ketorolac tromethamine and Methocarbamol  Home Medications   Current Outpatient Rx  Name Route Sig Dispense Refill  . ACETAMINOPHEN 500 MG PO TABS Oral Take 500 mg by mouth every 6 (six) hours as needed.    . IBUPROFEN 200 MG PO TABS Oral Take 1,200-1,600 mg by mouth every 8 (eight) hours as needed. For back pain    . LISINOPRIL 10 MG PO TABS Oral Take 10 mg by mouth daily.    Carma Leaven M PLUS PO TABS Oral Take 1 tablet by mouth daily.      Marland Kitchen NAPROXEN 250 MG PO TABS Oral Take 250 mg by mouth 2 (two) times daily with a meal.    . HYDROCODONE-ACETAMINOPHEN 5-500 MG PO TABS Oral Take 1-2 tablets by mouth every 6 (six) hours as needed for pain. 15 tablet 0  . PREDNISONE 20 MG PO TABS  3 Tabs PO Days 1-3, then 2 tabs PO Days 4-6, then 1 tab PO Day 7-9, then Half Tab PO Day 10-12 20 tablet 0    BP 165/109  Pulse 108  Temp 98.1 F (36.7 C) (Oral)  Resp 24  SpO2 96%  LMP 02/28/2012  Physical Exam  Constitutional: She appears well-developed and well-nourished.  HENT:  Head: Normocephalic.  Eyes: Pupils are equal, round, and reactive to light.  Neck: Normal range of motion.  Cardiovascular: Normal rate.   Pulmonary/Chest: Effort normal.  Musculoskeletal: She exhibits no edema and no tenderness.  Neurological: She is alert.  Skin: Skin is warm.    ED Course  Procedures (including critical care time)  Labs Reviewed - No data to display No results found.   1. Chronic back pain       MDM   Per chart review patient [facility]  Most recently, being September, 22nd where she was diagnosed with sciatica.  When asked about this.  Patient, states, that she's never been seen at [facility] and we must have the wrong patient registered Registration verified that this is indeed, the same patient.        Arman Filter, NP 03/23/12 0000

## 2012-03-22 NOTE — ED Notes (Signed)
Pt crying at present.  Med given

## 2012-03-22 NOTE — ED Notes (Signed)
Pt reports lower back pain for a few days, has hx of chronic back pain; denies loss in bowel function; pt crying at triage in pain

## 2012-03-23 MED ORDER — PREDNISONE 20 MG PO TABS: ORAL_TABLET | ORAL | Status: DC

## 2012-03-23 MED ORDER — HYDROCODONE-ACETAMINOPHEN 5-500 MG PO TABS: 1.0000 | ORAL_TABLET | Freq: Four times a day (QID) | ORAL | Status: DC | PRN

## 2012-03-23 NOTE — ED Notes (Signed)
Pt discharged.Vital signs stable .GCS 15 

## 2012-03-23 NOTE — ED Provider Notes (Signed)
Medical screening examination/treatment/procedure(s) were performed by non-physician practitioner and as supervising physician I was immediately available for consultation/collaboration.  Coda Mathey M Shawni Volkov, MD 03/23/12 0539 

## 2012-04-24 ENCOUNTER — Encounter (HOSPITAL_COMMUNITY): Payer: Self-pay | Admitting: *Deleted

## 2012-04-24 ENCOUNTER — Emergency Department (HOSPITAL_COMMUNITY)
Admission: EM | Admit: 2012-04-24 | Discharge: 2012-04-24 | Disposition: A | Discharge status: Home / Self Care | Payer: Medicare Other | Attending: Emergency Medicine | Admitting: Emergency Medicine

## 2012-04-24 ENCOUNTER — Emergency Department (HOSPITAL_COMMUNITY): Payer: Medicare Other

## 2012-04-24 DIAGNOSIS — M545 Low back pain, unspecified: Secondary | ICD-10-CM | POA: Insufficient documentation

## 2012-04-24 DIAGNOSIS — F17200 Nicotine dependence, unspecified, uncomplicated: Secondary | ICD-10-CM | POA: Insufficient documentation

## 2012-04-24 DIAGNOSIS — Z79899 Other long term (current) drug therapy: Secondary | ICD-10-CM | POA: Insufficient documentation

## 2012-04-24 DIAGNOSIS — Z9889 Other specified postprocedural states: Secondary | ICD-10-CM | POA: Insufficient documentation

## 2012-04-24 DIAGNOSIS — G8929 Other chronic pain: Secondary | ICD-10-CM | POA: Insufficient documentation

## 2012-04-24 DIAGNOSIS — I10 Essential (primary) hypertension: Secondary | ICD-10-CM | POA: Insufficient documentation

## 2012-04-24 MED ORDER — HYDROMORPHONE HCL PF 1 MG/ML IJ SOLN
1.0000 mg | Freq: Once | INTRAMUSCULAR | Status: AC
  Administered 2012-04-24: 1 mg via INTRAVENOUS
  Filled 2012-04-24: qty 1

## 2012-04-24 MED ORDER — OXYCODONE-ACETAMINOPHEN 5-325 MG PO TABS: 1.0000 | ORAL_TABLET | ORAL | Status: DC | PRN

## 2012-04-24 MED ORDER — HYDROMORPHONE HCL PF 1 MG/ML IJ SOLN
1.0000 mg | Freq: Once | INTRAMUSCULAR | Status: AC
  Administered 2012-04-24: 1 mg via INTRAMUSCULAR
  Filled 2012-04-24: qty 1

## 2012-04-24 NOTE — ED Notes (Signed)
Per ems: pt from home, c/o back problem for past few months. Scheduled to have lower back fusion this month. Reports increase in pain since yesterday, lost control of bowels three times today. Hx of HTN, depression, nerve pain. Takes lortab for pain. p 160/100, pulse 107, resp 18 even/unlabored

## 2012-04-24 NOTE — ED Provider Notes (Signed)
History     CSN: [id number]  Arrival date & time 04/24/12  1218   First MD Initiated Contact with Patient 04/24/12 1325      Chief Complaint  Patient presents with  . Back Pain    (Consider location/radiation/quality/duration/timing/severity/associated sxs/prior treatment) Patient is a 36 y.o. female presenting with back pain. The history is provided by the patient.  Back Pain  This is a chronic problem. Pertinent negatives include no fever, no numbness, no headaches, no abdominal pain and no dysuria. Associated symptoms comments: She has a history of back pain, multiple surgeries and reports she is due for another surgery scheduled in [place] for December 15th. She reports severe pain now with bowel and bladder incontinence. One episode last week, then none over the past 6 days, then recurrent bowel and bladder incontinence today. No abdominal pain. The location and distribution of the pain is the same as per her usual. .    Past Medical History  Diagnosis Date  . Hypertension   . Chronic back pain     Past Surgical History  Procedure Date  . Fracture surgery   . Back surgery   . Cesarean section   . Gastric bypass   . Ankle surgery     Family History  Problem Relation Age of Onset  . Diabetes Mother   . Hypertension Mother   . Diabetes Father   . Hypertension Father     History  Substance Use Topics  . Smoking status: Current Every Day Smoker -- 1.0 packs/day    Types: Cigarettes  . Smokeless tobacco: Not on file  . Alcohol Use: No    OB History    Grav Para Term Preterm Abortions TAB SAB Ect Mult Living                  Review of Systems  Constitutional: Negative for fever and chills.  HENT: Negative.   Respiratory: Negative.   Cardiovascular: Negative.   Gastrointestinal: Negative for nausea and abdominal pain.       See HPI.  Genitourinary: Negative for dysuria and hematuria.       See HPI.  Musculoskeletal: Positive for back pain.  Skin:  Negative.   Neurological: Negative.  Negative for numbness and headaches.    Allergies  Ketorolac tromethamine and Methocarbamol  Home Medications   Current Outpatient Rx  Name  Route  Sig  Dispense  Refill  . ACETAMINOPHEN 500 MG PO TABS   Oral   Take 500 mg by mouth every 6 (six) hours as needed.         Marland Kitchen AMITRIPTYLINE HCL 50 MG PO TABS   Oral   Take 50 mg by mouth at bedtime.         Marland Kitchen BACLOFEN 10 MG PO TABS   Oral   Take 20 mg by mouth 3 (three) times daily.         . DULOXETINE HCL 60 MG PO CPEP   Oral   Take 60 mg by mouth daily.         Marland Kitchen GABAPENTIN 800 MG PO TABS   Oral   Take 800 mg by mouth 3 (three) times daily.         Marland Kitchen HYDROCODONE-ACETAMINOPHEN 5-500 MG PO TABS   Oral   Take 1-2 tablets by mouth every 6 (six) hours as needed for pain.   15 tablet   0   . LISINOPRIL 10 MG PO TABS   Oral   Take  10 mg by mouth daily.         Carma Leaven M PLUS PO TABS   Oral   Take 1 tablet by mouth daily.             BP 139/88  Pulse 97  Temp 98.6 F (37 C) (Oral)  Resp 18  SpO2 100%  LMP 04/07/2012  Physical Exam  Constitutional: She is oriented to person, place, and time. She appears well-developed and well-nourished.  Neck: Normal range of motion.  Pulmonary/Chest: Effort normal.  Abdominal: Soft. There is no tenderness. There is no rebound and no guarding.  Musculoskeletal: Normal range of motion. She exhibits no edema.  Neurological: She is alert and oriented to person, place, and time. She has normal reflexes. Coordination normal.       Decreased rectal tone.  Skin: Skin is warm and dry.    ED Course  Procedures (including critical care time)  Labs Reviewed - No data to display No results found. Mr Lumbar Spine Wo Contrast  04/24/2012  *RADIOLOGY REPORT*  Clinical Data: Chronic low back pain with radiculopathy in both legs.  Patient complaining of incontinence today. History of lumbar surgery times four.  MRI LUMBAR SPINE WITHOUT  CONTRAST  Technique:  Multiplanar and multiecho pulse sequences of the lumbar spine were obtained without intravenous contrast.  Comparison: MR lumbar spine 12/06/2011.  Findings: Vertebral body height and alignment are maintained. Degenerative endplate signal change at L4-5 and L5-S1 is stable in appearance.  The conus medullaris is normal in signal and position. Imaged intra-abdominal contents are unremarkable.  The T11-12 levels imaged in the sagittal plane only and negative.  T12-L1:  Negative.  L1-2:  Negative.  L2-3:  There is some facet arthropathy and a minimal disc bulge. Central canal and foramina are open.  L3-4:  Disc bulge seen on the prior study appears smaller.  There is mild central canal and bilateral lateral recess narrowing. Foramina are open.  L4-5:  The patient is status post posterior decompression.  There is some disc bulging causing mild left lateral recess narrowing but the central spinal canal and foramina are open.  Appearance unchanged.  L5-S1:  Left laminotomy defect for discectomy is identified.  There is some endplate spurring and disc bulging eccentric the left but the central canal and right foramen are open.  Mild left foraminal narrowing is again seen. The appearance is unchanged.  IMPRESSION:  1.  Improved appearance of a disc bulge at L3-4 which causes mild central canal and bilateral lateral recess narrowing without overt nerve root compression. 2.  Postoperative change L4-5 and L5-S1.  Mild narrowing of the left lateral recess at L4-5 and left foramen at L5-S1 appears unchanged.   Original Report Authenticated By: Holley Dexter, M.D.     No diagnosis found.  1. Chronic low back pain   MDM  The patient reports that she tried multiple times to contact the nurse regarding need for bathroom and fell when she attempted to get up by herself. No incontinence with the fall. She reports increased pain. Ophelia Shoulder in to see and discuss situation with patient.   Back exam  unremarkable. Given her reported incontinence and pending surgery, MR Lumbar spine ordered. Dilaudid given for pain.  MRI negative for acute neurosurgical emergency. Pain is improved. Patient stable for discharge. Discussed findings and care plan with patient. All questions answered.        Rodena Medin, PA-C 04/24/12 1726

## 2012-04-24 NOTE — ED Notes (Signed)
XLK:[id number]> Expected date:04/24/12<BR> Expected time:<BR> Means of arrival:<BR> Comments:<BR> Elderly fall

## 2012-04-25 NOTE — ED Provider Notes (Signed)
Medical screening examination/treatment/procedure(s) were performed by non-physician practitioner and as supervising physician I was immediately available for consultation/collaboration.  Edmundo Tedesco R. Crandall Harvel, MD 04/25/12 0014 

## 2012-05-14 ENCOUNTER — Encounter (HOSPITAL_COMMUNITY): Payer: Self-pay | Admitting: *Deleted

## 2012-05-14 ENCOUNTER — Emergency Department (HOSPITAL_COMMUNITY)
Admission: EM | Admit: 2012-05-14 | Discharge: 2012-05-14 | Disposition: A | Discharge status: Home / Self Care | Payer: Medicare Other | Attending: Emergency Medicine | Admitting: Emergency Medicine

## 2012-05-14 DIAGNOSIS — M545 Low back pain, unspecified: Secondary | ICD-10-CM | POA: Insufficient documentation

## 2012-05-14 DIAGNOSIS — Z3202 Encounter for pregnancy test, result negative: Secondary | ICD-10-CM | POA: Insufficient documentation

## 2012-05-14 DIAGNOSIS — Z9889 Other specified postprocedural states: Secondary | ICD-10-CM | POA: Insufficient documentation

## 2012-05-14 DIAGNOSIS — F17200 Nicotine dependence, unspecified, uncomplicated: Secondary | ICD-10-CM | POA: Insufficient documentation

## 2012-05-14 DIAGNOSIS — I10 Essential (primary) hypertension: Secondary | ICD-10-CM | POA: Insufficient documentation

## 2012-05-14 DIAGNOSIS — G8918 Other acute postprocedural pain: Secondary | ICD-10-CM

## 2012-05-14 DIAGNOSIS — G8929 Other chronic pain: Secondary | ICD-10-CM | POA: Insufficient documentation

## 2012-05-14 DIAGNOSIS — F411 Generalized anxiety disorder: Secondary | ICD-10-CM | POA: Insufficient documentation

## 2012-05-14 DIAGNOSIS — Z79899 Other long term (current) drug therapy: Secondary | ICD-10-CM | POA: Insufficient documentation

## 2012-05-14 DIAGNOSIS — R209 Unspecified disturbances of skin sensation: Secondary | ICD-10-CM | POA: Insufficient documentation

## 2012-05-14 DIAGNOSIS — M549 Dorsalgia, unspecified: Secondary | ICD-10-CM

## 2012-05-14 DIAGNOSIS — R5381 Other malaise: Secondary | ICD-10-CM | POA: Insufficient documentation

## 2012-05-14 LAB — URINALYSIS, ROUTINE W REFLEX MICROSCOPIC
Bilirubin Urine: NEGATIVE
Hgb urine dipstick: NEGATIVE
Protein, ur: NEGATIVE mg/dL
Urobilinogen, UA: >8 mg/dL — ABNORMAL HIGH (ref 0.0–1.0)

## 2012-05-14 LAB — SEDIMENTATION RATE: Sed Rate: 27 mm/hr — ABNORMAL HIGH (ref 0–22)

## 2012-05-14 LAB — CBC WITH DIFFERENTIAL/PLATELET
Basophils Relative: 1 % (ref 0–1)
Eosinophils Absolute: 0.1 10*3/uL (ref 0.0–0.7)
Eosinophils Relative: 1 % (ref 0–5)
HCT: 37.3 % (ref 36.0–46.0)
Hemoglobin: 13.6 g/dL (ref 12.0–15.0)
MCH: 30.9 pg (ref 26.0–34.0)
MCHC: 36.5 g/dL — ABNORMAL HIGH (ref 30.0–36.0)
Monocytes Absolute: 0.7 10*3/uL (ref 0.1–1.0)
Monocytes Relative: 6 % (ref 3–12)

## 2012-05-14 LAB — BASIC METABOLIC PANEL
BUN: 10 mg/dL (ref 6–23)
Chloride: 104 mEq/L (ref 96–112)
Glucose, Bld: 85 mg/dL (ref 70–99)
Potassium: 3.8 mEq/L (ref 3.5–5.1)

## 2012-05-14 MED ORDER — HYDROMORPHONE HCL PF 1 MG/ML IJ SOLN
1.0000 mg | INTRAMUSCULAR | Status: DC | PRN
  Administered 2012-05-14 (×4): 1 mg via INTRAVENOUS
  Filled 2012-05-14 (×4): qty 1

## 2012-05-14 MED ORDER — CEPHALEXIN 500 MG PO CAPS: 500.0000 mg | ORAL_CAPSULE | Freq: Four times a day (QID) | ORAL | Status: DC

## 2012-05-14 MED ORDER — SODIUM CHLORIDE 0.9 % IV SOLN
Freq: Once | INTRAVENOUS | Status: AC
  Administered 2012-05-14: 13:00:00 via INTRAVENOUS

## 2012-05-14 MED ORDER — SODIUM CHLORIDE 0.9 % IV BOLUS (SEPSIS)
1000.0000 mL | Freq: Once | INTRAVENOUS | Status: AC
  Administered 2012-05-14: 1000 mL via INTRAVENOUS

## 2012-05-14 MED ORDER — HYDROMORPHONE HCL PF 1 MG/ML IJ SOLN
1.0000 mg | Freq: Once | INTRAMUSCULAR | Status: AC
  Administered 2012-05-14: 1 mg via INTRAVENOUS
  Filled 2012-05-14: qty 1

## 2012-05-14 MED ORDER — ONDANSETRON HCL 4 MG/2ML IJ SOLN
4.0000 mg | Freq: Once | INTRAMUSCULAR | Status: AC
  Administered 2012-05-14: 4 mg via INTRAVENOUS
  Filled 2012-05-14: qty 2

## 2012-05-14 NOTE — ED Provider Notes (Signed)
History     CSN: [id number]  Arrival date & time 05/14/12  1478   First MD Initiated Contact with Patient 05/14/12 1000      Chief Complaint  Patient presents with  . Back Pain     Patient is a 36 y.o. female presenting with back pain. The history is provided by the patient.  Back Pain  This is a recurrent problem. The current episode started more than 2 days ago. The problem occurs constantly. The problem has been gradually worsening. Associated with: recent surgery. The pain is present in the lumbar spine. The quality of the pain is described as stabbing. The pain radiates to the left thigh and right thigh. The pain is severe. The symptoms are aggravated by certain positions. The pain is the same all the time. Associated symptoms include numbness, tingling and weakness. Pertinent negatives include no chest pain, no fever, no abdominal pain, no bowel incontinence, no bladder incontinence and no dysuria. She has tried bed rest for the symptoms. The treatment provided no relief.  pt reports back pain She reports that she had lumbar discectomy and fusion this past week in [place] by Dr Dorris Singh She reports pain since surgery and it is worsening No bowel/urinary incontinence She reports numbness and increased weakness in both legs No cp.  She reports anxiety   Past Medical History  Diagnosis Date  . Hypertension   . Chronic back pain     Past Surgical History  Procedure Date  . Fracture surgery   . Back surgery   . Cesarean section   . Gastric bypass   . Ankle surgery   . Lumbar disc surgery   . Lumbar fusion     Family History  Problem Relation Age of Onset  . Diabetes Mother   . Hypertension Mother   . Diabetes Father   . Hypertension Father     History  Substance Use Topics  . Smoking status: Current Every Day Smoker -- 1.0 packs/day    Types: Cigarettes  . Smokeless tobacco: Not on file  . Alcohol Use: No    OB History    Grav Para Term Preterm Abortions  TAB SAB Ect Mult Living                  Review of Systems  Constitutional: Negative for fever.  Respiratory: Negative for chest tightness.   Cardiovascular: Negative for chest pain.  Gastrointestinal: Negative for abdominal pain and bowel incontinence.  Genitourinary: Negative for bladder incontinence and dysuria.  Musculoskeletal: Positive for back pain.  Neurological: Positive for tingling, weakness and numbness.  Psychiatric/Behavioral: The patient is nervous/anxious.   All other systems reviewed and are negative.    Allergies  Ketorolac tromethamine and Methocarbamol  Home Medications   Current Outpatient Rx  Name  Route  Sig  Dispense  Refill  . BACLOFEN 10 MG PO TABS   Oral   Take 20 mg by mouth 3 (three) times daily.         Marland Kitchen HYDROCODONE-ACETAMINOPHEN 5-500 MG PO TABS   Oral   Take 1-2 tablets by mouth every 6 (six) hours as needed for pain.   15 tablet   0   . THERA M PLUS PO TABS   Oral   Take 1 tablet by mouth daily.           . OXYCODONE-ACETAMINOPHEN 5-325 MG PO TABS   Oral   Take 1 tablet by mouth every 4 (four) hours as needed for  pain.   15 tablet   0     BP 132/83  Pulse 105  Temp 99 F (37.2 C) (Oral)  Resp 24  SpO2 100%  LMP 04/07/2012 BP 122/88  Pulse 86  Temp 99.3 F (37.4 C) (Rectal)  Resp 20  SpO2 98%  LMP 04/07/2012  Physical Exam CONSTITUTIONAL: Well developed/well nourished, anxious HEAD AND FACE: Normocephalic/atraumatic EYES: EOMI ENMT: Mucous membranes moist NECK: supple no meningeal signs SPINE:incision over lumbar spine that is erythematous and tender.  No drainage noted CV: S1/S2 noted, no murmurs/rubs/gallops noted LUNGS: Lungs are clear to auscultation bilaterally, no apparent distress ABDOMEN: soft, nontender, no rebound or guarding GU:no cva tenderness NEURO: Awake/alert, decreased rectal tone (chaperone present) but no saddle anesthesia She can flex hips and flex/extend both knees/ankles but full neuro  exam is limited due to patient is in pain EXTREMITIES: pulses normal, full ROM SKIN: warm, color normal PSYCH:anxious  ED Course  Procedures   Labs Reviewed  BASIC METABOLIC PANEL  CBC WITH DIFFERENTIAL  SEDIMENTATION RATE  URINALYSIS, ROUTINE W REFLEX MICROSCOPIC  11:10 AM Pt with recent back surgery reports increased pain Will attempt to contact surgeon in [place] She had decreased rectal tone but this has been documented previously No gross motor deficits, she has able to move her legs but is limited due to pain in back Will start infectious workup as well 12:36 PM Records from [place] arrived Pt had laminectomy of l3/4 and L5/Si Will attempt to contact her surgeon in [place] 1:28 PM I spoke to NP Laretta Alstrom at [facility], she works with [employer] She reports for wound infection would not recommend MRI at this time  Would recommend keflex and seen in office tomorrow She will d/w her attending Her number is [phone] 2:36 PM PT FEELS IMPROVED SHE CAN AMBULATE/BEAR WEIGHT, JUST HURTS HER BACK SHE DOES NOT WANT TO BE TRANSFERRED OR ADMITTED SHE AGREES TO F/U IN 48 HOURS ON Tuesday 12/24 FOR RE-EVAL IN [facility] AND I HAVE ARRANGED THIS WITH THE NP JOY IN [facility] Discussed strict return precautions with patient/family Will start keflex    MDM  Nursing notes including past medical history and social history reviewed and considered in documentation Previous records reviewed and considered Labs/vital reviewed and considered         Joya Gaskins, MD 05/14/12 1438

## 2012-05-14 NOTE — ED Notes (Signed)
Lumbardisctomy/fusion of l3,l4, l5 this past Tuesday. Been taking lortabs for x 4 mos. Prescribed lortabs at d/c but no pain relief. Called surgeon on Friday and was told to come in but did not b/c she doesn't like hospitals. Last x 2 lortabs was today ~ 0900.  C/o numbness/tingling in hands. Hyperventilation when she came but is breathing better now. Lumbar incision (~ 9") is proxima ted, and no drainage.

## 2012-05-27 ENCOUNTER — Emergency Department (HOSPITAL_COMMUNITY)
Admission: EM | Admit: 2012-05-27 | Discharge: 2012-05-27 | Disposition: A | Discharge status: Home / Self Care | Payer: Medicare Other | Attending: Emergency Medicine | Admitting: Emergency Medicine

## 2012-05-27 ENCOUNTER — Encounter (HOSPITAL_COMMUNITY): Payer: Self-pay | Admitting: Nurse Practitioner

## 2012-05-27 DIAGNOSIS — Z9889 Other specified postprocedural states: Secondary | ICD-10-CM | POA: Insufficient documentation

## 2012-05-27 DIAGNOSIS — G8918 Other acute postprocedural pain: Secondary | ICD-10-CM | POA: Insufficient documentation

## 2012-05-27 DIAGNOSIS — M545 Low back pain, unspecified: Secondary | ICD-10-CM | POA: Insufficient documentation

## 2012-05-27 DIAGNOSIS — I10 Essential (primary) hypertension: Secondary | ICD-10-CM | POA: Insufficient documentation

## 2012-05-27 DIAGNOSIS — Z7982 Long term (current) use of aspirin: Secondary | ICD-10-CM | POA: Insufficient documentation

## 2012-05-27 DIAGNOSIS — F17200 Nicotine dependence, unspecified, uncomplicated: Secondary | ICD-10-CM | POA: Insufficient documentation

## 2012-05-27 DIAGNOSIS — G8929 Other chronic pain: Secondary | ICD-10-CM | POA: Insufficient documentation

## 2012-05-27 DIAGNOSIS — Z79899 Other long term (current) drug therapy: Secondary | ICD-10-CM | POA: Insufficient documentation

## 2012-05-27 MED ORDER — NAPROXEN 500 MG PO TABS: 500.0000 mg | ORAL_TABLET | Freq: Two times a day (BID) | ORAL | Status: DC | PRN

## 2012-05-27 MED ORDER — HYDROCODONE-ACETAMINOPHEN 5-325 MG PO TABS: 1.0000 | ORAL_TABLET | ORAL | Status: DC | PRN

## 2012-05-27 MED ORDER — DIAZEPAM 5 MG PO TABS
5.0000 mg | ORAL_TABLET | Freq: Once | ORAL | Status: AC
  Administered 2012-05-27: 5 mg via ORAL
  Filled 2012-05-27: qty 1

## 2012-05-27 MED ORDER — IBUPROFEN 400 MG PO TABS
600.0000 mg | ORAL_TABLET | Freq: Once | ORAL | Status: AC
  Administered 2012-05-27: 600 mg via ORAL
  Filled 2012-05-27: qty 1

## 2012-05-27 MED ORDER — HYDROMORPHONE HCL PF 1 MG/ML IJ SOLN
1.0000 mg | Freq: Once | INTRAMUSCULAR | Status: AC
  Administered 2012-05-27: 1 mg via INTRAMUSCULAR
  Filled 2012-05-27: qty 1

## 2012-05-27 NOTE — ED Notes (Signed)
Pt had lumbar surgery in [place] [facility] on 12/17. Continues to have lower back pain, brown drainage from surgical site since. States she has been unable to f/u with surgeon due to financial difficulty.

## 2012-05-30 NOTE — ED Provider Notes (Signed)
History    37 year old female with lower back pain. Patient had lumbar surgery temperature hernia on December 17. Persistent pain since then. She feels like she's having some drainage from her in such as well. No fevers or chills. No urinary complaints. Patient has not followed up with her surgeon since this procedure. She has presented to emergency room for the same though. No acute numbness, tingling or loss of strength.   CSN: [id number]  Arrival date & time 05/27/12  1208   First MD Initiated Contact with Patient 05/27/12 1217      Chief Complaint  Patient presents with  . Back Pain    (Consider location/radiation/quality/duration/timing/severity/associated sxs/prior treatment) HPI  Past Medical History  Diagnosis Date  . Hypertension   . Chronic back pain     Past Surgical History  Procedure Date  . Fracture surgery   . Back surgery   . Cesarean section   . Gastric bypass   . Ankle surgery   . Lumbar disc surgery   . Lumbar fusion     Family History  Problem Relation Age of Onset  . Diabetes Mother   . Hypertension Mother   . Diabetes Father   . Hypertension Father     History  Substance Use Topics  . Smoking status: Current Every Day Smoker -- 1.0 packs/day    Types: Cigarettes  . Smokeless tobacco: Not on file  . Alcohol Use: No    OB History    Grav Para Term Preterm Abortions TAB SAB Ect Mult Living                  Review of Systems  All systems reviewed and negative, other than as noted in HPI.   Allergies  Ketorolac tromethamine and Methocarbamol  Home Medications   Current Outpatient Rx  Name  Route  Sig  Dispense  Refill  . ASPIRIN-ACETAMINOPHEN-CAFFEINE 250-250-65 MG PO TABS   Oral   Take 2 tablets by mouth every 6 (six) hours as needed. For pain         . BACLOFEN 10 MG PO TABS   Oral   Take 20 mg by mouth 3 (three) times daily.         Marland Kitchen GABAPENTIN 800 MG PO TABS   Oral   Take 800 mg by mouth 3 (three) times daily.        Carma Leaven M PLUS PO TABS   Oral   Take 1 tablet by mouth daily.           Marland Kitchen HYDROCODONE-ACETAMINOPHEN 5-325 MG PO TABS   Oral   Take 1-2 tablets by mouth every 4 (four) hours as needed for pain.   20 tablet   0   . NAPROXEN 500 MG PO TABS   Oral   Take 1 tablet (500 mg total) by mouth 2 (two) times daily as needed.   20 tablet   0     BP 157/109  Pulse 93  Temp 98.8 F (37.1 C) (Oral)  Resp 15  SpO2 99%  Physical Exam  Nursing note and vitals reviewed. Constitutional: She appears well-developed and well-nourished. No distress.  HENT:  Head: Normocephalic and atraumatic.  Eyes: Conjunctivae normal are normal. Right eye exhibits no discharge. Left eye exhibits no discharge.  Neck: Neck supple.  Cardiovascular: Normal rate, regular rhythm and normal heart sounds.  Exam reveals no gallop and no friction rub.   No murmur heard. Pulmonary/Chest: Effort normal and breath sounds normal. No  respiratory distress.  Abdominal: Soft. She exhibits no distension. There is no tenderness.  Musculoskeletal: She exhibits no edema and no tenderness.       Midline lumbar incision. Intact. No drainage noted. No surrounding erythema. Localized tenderness in this area which units appropriate for her recent surgery.  Neurological: She is alert.       Strength 5 out of 5 bilateral lower extremities. Patellar reflexes are one plus bilaterally. Sensation is intact to light touch. Palpable DP pulses bilaterally.  Skin: Skin is warm and dry.  Psychiatric: She has a normal mood and affect. Her behavior is normal. Thought content normal.    ED Course  Procedures (including critical care time)  Labs Reviewed - No data to display No results found.   1. Lower back pain       MDM  37 year old female with lower back pain. Recent surgery on December 17. Recent evaluations for the same. Her incision is intact and appears to be healing well. There are no concerning findings for possible  infection. She has a nonfocal neurological exam. Feel she is safe for discharge at this time. Plan continued symptomatic treatment. Patient was encouraged to followup with her surgeon in [place] is she can. Emergent return precautions discussed.        Raeford Razor, MD 05/31/12 [phone]

## 2012-06-04 ENCOUNTER — Encounter (HOSPITAL_COMMUNITY): Payer: Self-pay | Admitting: Emergency Medicine

## 2012-06-04 ENCOUNTER — Emergency Department (HOSPITAL_COMMUNITY): Payer: Medicare Other

## 2012-06-04 ENCOUNTER — Emergency Department (HOSPITAL_COMMUNITY)
Admission: EM | Admit: 2012-06-04 | Discharge: 2012-06-04 | Disposition: A | Discharge status: Home / Self Care | Payer: Medicare Other | Attending: Emergency Medicine | Admitting: Emergency Medicine

## 2012-06-04 DIAGNOSIS — G8929 Other chronic pain: Secondary | ICD-10-CM | POA: Insufficient documentation

## 2012-06-04 DIAGNOSIS — Z3202 Encounter for pregnancy test, result negative: Secondary | ICD-10-CM | POA: Insufficient documentation

## 2012-06-04 DIAGNOSIS — F319 Bipolar disorder, unspecified: Secondary | ICD-10-CM | POA: Insufficient documentation

## 2012-06-04 DIAGNOSIS — M549 Dorsalgia, unspecified: Secondary | ICD-10-CM

## 2012-06-04 DIAGNOSIS — Z8739 Personal history of other diseases of the musculoskeletal system and connective tissue: Secondary | ICD-10-CM | POA: Insufficient documentation

## 2012-06-04 DIAGNOSIS — M545 Low back pain, unspecified: Secondary | ICD-10-CM | POA: Insufficient documentation

## 2012-06-04 DIAGNOSIS — Z8742 Personal history of other diseases of the female genital tract: Secondary | ICD-10-CM | POA: Insufficient documentation

## 2012-06-04 DIAGNOSIS — F17200 Nicotine dependence, unspecified, uncomplicated: Secondary | ICD-10-CM | POA: Insufficient documentation

## 2012-06-04 DIAGNOSIS — I10 Essential (primary) hypertension: Secondary | ICD-10-CM | POA: Insufficient documentation

## 2012-06-04 DIAGNOSIS — Z8679 Personal history of other diseases of the circulatory system: Secondary | ICD-10-CM | POA: Insufficient documentation

## 2012-06-04 DIAGNOSIS — Z79899 Other long term (current) drug therapy: Secondary | ICD-10-CM | POA: Insufficient documentation

## 2012-06-04 DIAGNOSIS — Z981 Arthrodesis status: Secondary | ICD-10-CM | POA: Insufficient documentation

## 2012-06-04 DIAGNOSIS — Z9889 Other specified postprocedural states: Secondary | ICD-10-CM | POA: Insufficient documentation

## 2012-06-04 HISTORY — DX: Migraine, unspecified, not intractable, without status migrainosus: G43.909

## 2012-06-04 HISTORY — DX: Bipolar disorder, unspecified: F31.9

## 2012-06-04 HISTORY — DX: Sciatica, unspecified side: M54.30

## 2012-06-04 HISTORY — DX: Stress incontinence (female) (male): N39.3

## 2012-06-04 LAB — URINALYSIS, ROUTINE W REFLEX MICROSCOPIC
Bilirubin Urine: NEGATIVE
Glucose, UA: NEGATIVE mg/dL
Hgb urine dipstick: NEGATIVE
Specific Gravity, Urine: 1.007 (ref 1.005–1.030)

## 2012-06-04 LAB — PREGNANCY, URINE: Preg Test, Ur: NEGATIVE

## 2012-06-04 MED ORDER — OXYCODONE-ACETAMINOPHEN 5-325 MG PO TABS: ORAL_TABLET | ORAL | Status: DC

## 2012-06-04 MED ORDER — HYDROMORPHONE HCL PF 1 MG/ML IJ SOLN
1.0000 mg | Freq: Once | INTRAMUSCULAR | Status: AC
  Administered 2012-06-04: 1 mg via INTRAMUSCULAR
  Filled 2012-06-04: qty 1

## 2012-06-04 MED ORDER — DIAZEPAM 5 MG PO TABS
5.0000 mg | ORAL_TABLET | Freq: Once | ORAL | Status: AC
  Administered 2012-06-04: 5 mg via ORAL
  Filled 2012-06-04: qty 1

## 2012-06-04 MED ORDER — CYCLOBENZAPRINE HCL 10 MG PO TABS: 10.0000 mg | ORAL_TABLET | Freq: Three times a day (TID) | ORAL | Status: DC | PRN

## 2012-06-04 MED ORDER — IBUPROFEN 400 MG PO TABS
400.0000 mg | ORAL_TABLET | Freq: Once | ORAL | Status: AC
  Administered 2012-06-04: 400 mg via ORAL
  Filled 2012-06-04: qty 1

## 2012-06-04 MED ORDER — OXYCODONE-ACETAMINOPHEN 5-325 MG PO TABS
2.0000 | ORAL_TABLET | Freq: Once | ORAL | Status: AC
  Administered 2012-06-04: 2 via ORAL
  Filled 2012-06-04: qty 2

## 2012-06-04 NOTE — ED Notes (Addendum)
Pt presents to ED via EMS after falling, weakness for the past 2 days. Pt has had multiple recent lower back surgeries. Pt reports pain in lower back radiating down her left leg and both hips. Pt on board from EMS. NAD.

## 2012-06-04 NOTE — ED Notes (Signed)
Pt ambulated in room with increasing pain. Pt states she is unable to bare weight on left leg.

## 2012-06-04 NOTE — ED Provider Notes (Signed)
History     CSN: [id number]  Arrival date & time 06/04/12  0920   First MD Initiated Contact with Patient 06/04/12 [phone]      Chief Complaint  Patient presents with  . Back Pain     HPI Pt was seen at 0945.  Per pt, c/o gradual onset and persistence of constant acute flair of her chronic low back "pain" for the past several weeks, worse over the past several days.  States the pain began after she ran out of her pain meds from her last ED visit for same on 05/27/12. Denies any change in her usual chronic pain pattern, radiating down her left leg.  Pain worsens with palpation of the area and body position changes. Denies incont/retention of bowel or bladder, no saddle anesthesia, no focal motor weakness, no tingling/numbness in extremities, no fevers, no injury, no abd pain.   The symptoms have been associated with no other complaints. The patient has a significant history of similar symptoms previously, recently being evaluated for this complaint and multiple prior evals for same.     Past Medical History  Diagnosis Date  . Hypertension   . Chronic back pain   . Migraine headache   . Stress incontinence   . Sciatica   . Bipolar disorder     Past Surgical History  Procedure Date  . Fracture surgery   . Back surgery   . Cesarean section   . Gastric bypass   . Ankle surgery   . Lumbar disc surgery   . Lumbar fusion     Family History  Problem Relation Age of Onset  . Diabetes Mother   . Hypertension Mother   . Diabetes Father   . Hypertension Father     History  Substance Use Topics  . Smoking status: Current Every Day Smoker -- 1.0 packs/day    Types: Cigarettes  . Smokeless tobacco: Not on file  . Alcohol Use: No    Review of Systems ROS: Statement: All systems negative except as marked or noted in the HPI; Constitutional: Negative for fever and chills. ; ; Eyes: Negative for eye pain, redness and discharge. ; ; ENMT: Negative for ear pain, hoarseness, nasal  congestion, sinus pressure and sore throat. ; ; Cardiovascular: Negative for chest pain, palpitations, diaphoresis, dyspnea and peripheral edema. ; ; Respiratory: Negative for cough, wheezing and stridor. ; ; Gastrointestinal: Negative for nausea, vomiting, diarrhea, abdominal pain, blood in stool, hematemesis, jaundice and rectal bleeding. . ; ; Genitourinary: Negative for dysuria, flank pain and hematuria. ; ; Musculoskeletal: +LBP. Negative for neck pain. Negative for swelling and trauma.; ; Skin: Negative for pruritus, rash, abrasions, blisters, bruising and skin lesion.; ; Neuro: Negative for headache, lightheadedness and neck stiffness. Negative for weakness, altered level of consciousness , altered mental status, extremity weakness, paresthesias, involuntary movement, seizure and syncope.       Allergies  Ketorolac tromethamine and Methocarbamol  Home Medications   Current Outpatient Rx  Name  Route  Sig  Dispense  Refill  . ALBUTEROL SULFATE HFA 108 (90 BASE) MCG/ACT IN AERS   Inhalation   Inhale 2 puffs into the lungs every 6 (six) hours as needed. As needed for shortness of breath.         . AMITRIPTYLINE HCL 50 MG PO TABS   Oral   Take 50 mg by mouth at bedtime.         Marland Kitchen AMLODIPINE BESYLATE 2.5 MG PO TABS   Oral  Take 2.5 mg by mouth daily.         . ASPIRIN-ACETAMINOPHEN-CAFFEINE 250-250-65 MG PO TABS   Oral   Take 2 tablets by mouth every 6 (six) hours as needed. For pain         . BACLOFEN 10 MG PO TABS   Oral   Take 20 mg by mouth 3 (three) times daily.         . DULOXETINE HCL 60 MG PO CPEP   Oral   Take 60 mg by mouth daily.         Marland Kitchen GABAPENTIN 800 MG PO TABS   Oral   Take 800 mg by mouth 3 (three) times daily.         . MELOXICAM 7.5 MG PO TABS   Oral   Take 7.5 mg by mouth 2 (two) times daily as needed. As needed for pain. Take with omeprazole.         Carma Leaven M PLUS PO TABS   Oral   Take 1 tablet by mouth daily.           Marland Kitchen  OMEPRAZOLE 20 MG PO CPDR   Oral   Take 20 mg by mouth 2 (two) times daily. Take with meloxicam.           BP 125/89  Pulse 84  Temp 98.5 F (36.9 C) (Oral)  Resp 17  SpO2 98%  Physical Exam 0950: Physical examination:  Nursing notes reviewed; Vital signs and O2 SAT reviewed;  Constitutional: Well developed, Well nourished, Well hydrated, In no acute distress; Head:  Normocephalic, atraumatic; Eyes: EOMI, PERRL, No scleral icterus; ENMT: Mouth and pharynx normal, Mucous membranes moist; Neck: Supple, Full range of motion, No lymphadenopathy; Cardiovascular: Regular rate and rhythm, No murmur, rub, or gallop; Respiratory: Breath sounds clear & equal bilaterally, No rales, rhonchi, wheezes.  Speaking full sentences with ease, Normal respiratory effort/excursion; Chest: Nontender, Movement normal; Abdomen: Soft, Nontender, Nondistended, Normal bowel sounds; Genitourinary: No CVA tenderness; Spine:  No midline CS, TS, LS tenderness.  +TTP left lumbar paraspinal muscles;; Extremities: Pulses normal, No tenderness, No edema, No calf edema or asymmetry.; Neuro: AA&Ox3, Major CN grossly intact.  Speech clear. Strength 5/5 equal bilat UE's and LE's, including great toe dorsiflexion.  DTR patella and achilles 2/4 equal bilat LE's.  No gross sensory deficits.  Neg straight leg raises bilat.;; Skin: Color normal, Warm, Dry.   ED Course  Procedures   [phone]:  Appears acute flair of her longstanding chronic LBP today, no concerning signs for cauda equina. Previous MRI in 04/24/2012 with s/p multiple laminectomies.  Most recent lumbar laminectomy approx 1 month ago.  No fever, rash, midline tenderness to suggest infection. Will tx symptomatically.    1300:  Long hx of chronic pain with multiple ED visits for same.  Pt endorses acute flair of her usual long standing chronic pain today, no change from her usual chronic pain pattern.  Pt encouraged to f/u with her PMD and Pain Management doctor for good  continuity of care and control of her chronic pain.  Verb understanding.  Pt has ambulated in the ED but states her pain worsens with weightbearing on her LLE and is requesting more pain meds.  No neuro deficits on exam.  Will dose percocet before discharge.    MDM  MDM Reviewed: previous chart, nursing note and vitals Reviewed previous: MRI and x-ray Interpretation: labs and x-ray   Results for orders placed during the hospital encounter of  06/04/12  URINALYSIS, ROUTINE W REFLEX MICROSCOPIC      Component Value Range   Color, Urine YELLOW  YELLOW   APPearance CLEAR  CLEAR   Specific Gravity, Urine 1.007  1.005 - 1.030   pH 8.0  5.0 - 8.0   Glucose, UA NEGATIVE  NEGATIVE mg/dL   Hgb urine dipstick NEGATIVE  NEGATIVE   Bilirubin Urine NEGATIVE  NEGATIVE   Ketones, ur NEGATIVE  NEGATIVE mg/dL   Protein, ur NEGATIVE  NEGATIVE mg/dL   Urobilinogen, UA 1.0  0.0 - 1.0 mg/dL   Nitrite NEGATIVE  NEGATIVE   Leukocytes, UA NEGATIVE  NEGATIVE  PREGNANCY, URINE      Component Value Range   Preg Test, Ur NEGATIVE  NEGATIVE   Dg Lumbar Spine Complete 06/04/2012  *RADIOLOGY REPORT*  Clinical Data: History of fall complaining of low back pain.  LUMBAR SPINE - COMPLETE 4+ VIEW  Comparison: MRI of the lumbar spine 04/24/2012.  Findings: Five views of the lumbar spine demonstrate no acute displaced fracture or definite compression type fracture.  There is multilevel degenerative disc disease and facet arthropathy, most severe at L4-L5 and L5-S1.  No defects of the pars interarticularis are noted. Status post laminectomy from L3-L5.  Alignment is anatomic.  Atherosclerosis in the abdominal and pelvic vasculature.  IMPRESSION: 1.  No acute radiographic abnormality of the lumbar spine. 2.  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 3.  Atherosclerosis.   Original Report Authenticated By: Trudie Reed, M.D.     MRI 04/24/2012: IMPRESSION:  1. Improved appearance of a disc  bulge at L3-4 which causes mild central canal and bilateral lateral recess narrowing without overt nerve root compression.  2. Postoperative change L4-5 and L5-S1. Mild narrowing of the left lateral recess at L4-5 and left foramen at L5-S1 appears unchanged.   Original Report Authenticated By: Holley Dexter, M.D.          Laray Anger, DO 06/06/12 2254

## 2012-06-17 ENCOUNTER — Encounter (HOSPITAL_COMMUNITY): Payer: Self-pay | Admitting: Emergency Medicine

## 2012-06-17 ENCOUNTER — Emergency Department (INDEPENDENT_AMBULATORY_CARE_PROVIDER_SITE_OTHER)
Admission: EM | Admit: 2012-06-17 | Discharge: 2012-06-17 | Disposition: A | Discharge status: Home / Self Care | Payer: Medicare Other | Source: Home / Self Care

## 2012-06-17 DIAGNOSIS — G8929 Other chronic pain: Secondary | ICD-10-CM

## 2012-06-17 DIAGNOSIS — M545 Low back pain: Secondary | ICD-10-CM

## 2012-06-17 DIAGNOSIS — M5126 Other intervertebral disc displacement, lumbar region: Secondary | ICD-10-CM

## 2012-06-17 LAB — POCT URINALYSIS DIP (DEVICE)
Glucose, UA: NEGATIVE mg/dL
Ketones, ur: NEGATIVE mg/dL
Specific Gravity, Urine: 1.02 (ref 1.005–1.030)

## 2012-06-17 MED ORDER — OXYCODONE-ACETAMINOPHEN 10-325 MG PO TABS: 1.0000 | ORAL_TABLET | ORAL | Status: DC | PRN

## 2012-06-17 NOTE — ED Provider Notes (Signed)
History     CSN: [id number]  Arrival date & time 06/17/12  1258   First MD Initiated Contact with Patient 06/17/12 1404      Chief Complaint  Patient presents with  . Back Pain    (Consider location/radiation/quality/duration/timing/severity/associated sxs/prior treatment) HPI Comments: 37 year old morbidly and severely obese female is complaining of acute on chronic low back pain radiating bilaterally and anteriorly. States that he pain began 3 days ago. That coincides with the time in which she ran out of Percocet. She has been to the emergency department several times for pain control. She had lumbar surgery in December of 2013. She has multilevel DJD and facet arthropathy. She complains of numbness and pain radiating into both eyes. She denies incontinence, saddle paresthesia or inability to ambulate. The pain is exacerbated by bending twisting of the torso and other movements.   Past Medical History  Diagnosis Date  . Hypertension   . Chronic back pain   . Migraine headache   . Stress incontinence   . Sciatica   . Bipolar disorder     Past Surgical History  Procedure Date  . Fracture surgery   . Back surgery   . Cesarean section   . Gastric bypass   . Ankle surgery   . Lumbar disc surgery   . Lumbar fusion     Family History  Problem Relation Age of Onset  . Diabetes Mother   . Hypertension Mother   . Diabetes Father   . Hypertension Father     History  Substance Use Topics  . Smoking status: Current Every Day Smoker -- 1.0 packs/day    Types: Cigarettes  . Smokeless tobacco: Not on file  . Alcohol Use: No    OB History    Grav Para Term Preterm Abortions TAB SAB Ect Mult Living                  Review of Systems  Constitutional: Positive for activity change. Negative for fever and chills.  HENT: Negative.   Respiratory: Negative.   Cardiovascular: Negative.   Gastrointestinal: Negative.   Genitourinary: Negative.   Musculoskeletal: Positive  for myalgias and back pain.       As per HPI  Skin: Negative for color change, pallor and rash.  Neurological: Negative for syncope, speech difficulty, light-headedness and headaches.    Allergies  Ketorolac tromethamine and Methocarbamol  Home Medications   Current Outpatient Rx  Name  Route  Sig  Dispense  Refill  . AMITRIPTYLINE HCL 50 MG PO TABS   Oral   Take 50 mg by mouth at bedtime.         Marland Kitchen AMLODIPINE BESYLATE 2.5 MG PO TABS   Oral   Take 2.5 mg by mouth daily.         . ASPIRIN-ACETAMINOPHEN-CAFFEINE 250-250-65 MG PO TABS   Oral   Take 2 tablets by mouth every 6 (six) hours as needed. For pain         . GABAPENTIN 800 MG PO TABS   Oral   Take 800 mg by mouth 3 (three) times daily.         . ALBUTEROL SULFATE HFA 108 (90 BASE) MCG/ACT IN AERS   Inhalation   Inhale 2 puffs into the lungs every 6 (six) hours as needed. As needed for shortness of breath.         Marland Kitchen BACLOFEN 10 MG PO TABS   Oral   Take 20 mg by mouth  3 (three) times daily.         . CYCLOBENZAPRINE HCL 10 MG PO TABS   Oral   Take 1 tablet (10 mg total) by mouth 3 (three) times daily as needed for muscle spasms.   20 tablet   0   . DULOXETINE HCL 60 MG PO CPEP   Oral   Take 60 mg by mouth daily.         . MELOXICAM 7.5 MG PO TABS   Oral   Take 7.5 mg by mouth 2 (two) times daily as needed. As needed for pain. Take with omeprazole.         Carma Leaven M PLUS PO TABS   Oral   Take 1 tablet by mouth daily.           Marland Kitchen OMEPRAZOLE 20 MG PO CPDR   Oral   Take 20 mg by mouth 2 (two) times daily. Take with meloxicam.         . OXYCODONE-ACETAMINOPHEN 10-325 MG PO TABS   Oral   Take 1 tablet by mouth every 4 (four) hours as needed for pain.   20 tablet   0   . OXYCODONE-ACETAMINOPHEN 5-325 MG PO TABS      1 or 2 tabs PO q6h prn pain   25 tablet   0     BP 158/96  Pulse 99  Temp 97.9 F (36.6 C) (Oral)  Resp 16  SpO2 99%  LMP 05/27/2012  Physical Exam  Nursing  note and vitals reviewed. Constitutional: She is oriented to person, place, and time. She appears well-developed and well-nourished. No distress.  HENT:  Head: Normocephalic and atraumatic.  Eyes: EOM are normal. Pupils are equal, round, and reactive to light.  Neck: Normal range of motion. Neck supple.  Cardiovascular: Normal rate and normal heart sounds.   Pulmonary/Chest: Effort normal and breath sounds normal. No respiratory distress.  Abdominal: Soft. She exhibits no distension. There is no tenderness. There is no rebound and no guarding.  Musculoskeletal: She exhibits tenderness.        Midline Scar over the lumbar spine. Tenderness over the lumbar spine and paralumbar musculature as well as musculature of the buttocks and lateral thighs.  Lymphadenopathy:    She has no cervical adenopathy.  Neurological: She is alert and oriented to person, place, and time. She has normal reflexes. No cranial nerve deficit.  Skin: Skin is warm and dry.  Psychiatric: She has a normal mood and affect.    ED Course  Procedures (including critical care time)  Labs Reviewed  POCT URINALYSIS DIP (DEVICE) - Abnormal; Notable for the following:    Hgb urine dipstick TRACE (*)     All other components within normal limits   No results found.   1. Chronic low back pain   2. Lumbar discogenic pain syndrome       MDM  The patient was instructed to call her PCP to make arrangements for referral to a pain management clinic she has been to the emergency department several times in the past few months including twice this month and today at urgent care makes 3 visits for January.  Percocet 10 q 4 hr prn pain  #20 Stable for discharge. No signs of infection.         Hayden Rasmussen, NP 06/17/12 [phone]

## 2012-06-17 NOTE — ED Notes (Addendum)
Pt c/o lower back pain that radiates to abd x3 days Pain is a constant throbbing pain w/intemittent sharp pains Sx include: headaches, tremors, nauseas Denies: f/v/d, urinary prob, hematuria Hx of lumbar surg, sciatic nerve  She is alert w/mild discomfort due to pain

## 2012-06-18 NOTE — ED Provider Notes (Signed)
Medical screening examination/treatment/procedure(s) were performed by non-physician practitioner and as supervising physician I was immediately available for consultation/collaboration.  Leslee Home, M.D.   Reuben Likes, MD 06/18/12 1900

## 2012-06-21 ENCOUNTER — Emergency Department (HOSPITAL_COMMUNITY)
Admission: EM | Admit: 2012-06-21 | Discharge: 2012-06-22 | Disposition: A | Discharge status: Home / Self Care | Payer: Medicare Other | Attending: Emergency Medicine | Admitting: Emergency Medicine

## 2012-06-21 ENCOUNTER — Encounter (HOSPITAL_COMMUNITY): Payer: Self-pay | Admitting: Emergency Medicine

## 2012-06-21 DIAGNOSIS — Z8739 Personal history of other diseases of the musculoskeletal system and connective tissue: Secondary | ICD-10-CM | POA: Insufficient documentation

## 2012-06-21 DIAGNOSIS — Z7982 Long term (current) use of aspirin: Secondary | ICD-10-CM | POA: Insufficient documentation

## 2012-06-21 DIAGNOSIS — F17200 Nicotine dependence, unspecified, uncomplicated: Secondary | ICD-10-CM | POA: Insufficient documentation

## 2012-06-21 DIAGNOSIS — F1210 Cannabis abuse, uncomplicated: Secondary | ICD-10-CM | POA: Insufficient documentation

## 2012-06-21 DIAGNOSIS — M79609 Pain in unspecified limb: Secondary | ICD-10-CM | POA: Insufficient documentation

## 2012-06-21 DIAGNOSIS — Z3202 Encounter for pregnancy test, result negative: Secondary | ICD-10-CM | POA: Insufficient documentation

## 2012-06-21 DIAGNOSIS — Z79899 Other long term (current) drug therapy: Secondary | ICD-10-CM | POA: Insufficient documentation

## 2012-06-21 DIAGNOSIS — Z9889 Other specified postprocedural states: Secondary | ICD-10-CM | POA: Insufficient documentation

## 2012-06-21 DIAGNOSIS — Z9884 Bariatric surgery status: Secondary | ICD-10-CM | POA: Insufficient documentation

## 2012-06-21 DIAGNOSIS — I10 Essential (primary) hypertension: Secondary | ICD-10-CM | POA: Insufficient documentation

## 2012-06-21 DIAGNOSIS — R45851 Suicidal ideations: Secondary | ICD-10-CM | POA: Insufficient documentation

## 2012-06-21 DIAGNOSIS — G43909 Migraine, unspecified, not intractable, without status migrainosus: Secondary | ICD-10-CM | POA: Insufficient documentation

## 2012-06-21 DIAGNOSIS — F319 Bipolar disorder, unspecified: Secondary | ICD-10-CM | POA: Insufficient documentation

## 2012-06-21 DIAGNOSIS — G8929 Other chronic pain: Secondary | ICD-10-CM | POA: Insufficient documentation

## 2012-06-21 DIAGNOSIS — Z8781 Personal history of (healed) traumatic fracture: Secondary | ICD-10-CM | POA: Insufficient documentation

## 2012-06-21 DIAGNOSIS — Z87448 Personal history of other diseases of urinary system: Secondary | ICD-10-CM | POA: Insufficient documentation

## 2012-06-21 DIAGNOSIS — F329 Major depressive disorder, single episode, unspecified: Secondary | ICD-10-CM

## 2012-06-21 DIAGNOSIS — M549 Dorsalgia, unspecified: Secondary | ICD-10-CM | POA: Insufficient documentation

## 2012-06-21 LAB — CBC
HCT: 36.7 % (ref 36.0–46.0)
Hemoglobin: 13.1 g/dL (ref 12.0–15.0)
RBC: 4.42 MIL/uL (ref 3.87–5.11)
WBC: 11.2 10*3/uL — ABNORMAL HIGH (ref 4.0–10.5)

## 2012-06-21 LAB — COMPREHENSIVE METABOLIC PANEL
Albumin: 3.6 g/dL (ref 3.5–5.2)
Alkaline Phosphatase: 91 U/L (ref 39–117)
BUN: 10 mg/dL (ref 6–23)
CO2: 25 mEq/L (ref 19–32)
Chloride: 105 mEq/L (ref 96–112)
GFR calc Af Amer: >90 mL/min (ref >90)
Glucose, Bld: 82 mg/dL (ref 70–99)
Potassium: 3.8 mEq/L (ref 3.5–5.1)
Total Bilirubin: 0.2 mg/dL — ABNORMAL LOW (ref 0.3–1.2)

## 2012-06-21 LAB — ETHANOL: Alcohol, Ethyl (B): <11 mg/dL (ref 0–11)

## 2012-06-21 LAB — ACETAMINOPHEN LEVEL: Acetaminophen (Tylenol), Serum: <15 ug/mL (ref 10–30)

## 2012-06-21 LAB — RAPID URINE DRUG SCREEN, HOSP PERFORMED
Barbiturates: NOT DETECTED
Benzodiazepines: NOT DETECTED

## 2012-06-21 LAB — POCT PREGNANCY, URINE: Preg Test, Ur: NEGATIVE

## 2012-06-21 MED ORDER — AMITRIPTYLINE HCL 25 MG PO TABS
50.0000 mg | ORAL_TABLET | Freq: Every day | ORAL | Status: DC
  Administered 2012-06-22: 50 mg via ORAL
  Filled 2012-06-21: qty 2

## 2012-06-21 MED ORDER — NICOTINE 21 MG/24HR TD PT24
21.0000 mg | MEDICATED_PATCH | Freq: Every day | TRANSDERMAL | Status: DC
  Administered 2012-06-22: 21 mg via TRANSDERMAL
  Filled 2012-06-21: qty 1

## 2012-06-21 MED ORDER — GABAPENTIN 400 MG PO CAPS
800.0000 mg | ORAL_CAPSULE | Freq: Three times a day (TID) | ORAL | Status: DC
  Administered 2012-06-22: 800 mg via ORAL
  Filled 2012-06-21 (×4): qty 2

## 2012-06-21 MED ORDER — ZOLPIDEM TARTRATE 5 MG PO TABS: 5.0000 mg | ORAL_TABLET | Freq: Every evening | ORAL | Status: DC | PRN

## 2012-06-21 MED ORDER — ACETAMINOPHEN 325 MG PO TABS
650.0000 mg | ORAL_TABLET | ORAL | Status: DC | PRN
  Filled 2012-06-21: qty 2

## 2012-06-21 MED ORDER — IBUPROFEN 200 MG PO TABS
600.0000 mg | ORAL_TABLET | Freq: Three times a day (TID) | ORAL | Status: DC | PRN
  Filled 2012-06-21: qty 1

## 2012-06-21 MED ORDER — AMLODIPINE BESYLATE 2.5 MG PO TABS
2.5000 mg | ORAL_TABLET | Freq: Every day | ORAL | Status: DC
  Filled 2012-06-21: qty 1

## 2012-06-21 MED ORDER — PANTOPRAZOLE SODIUM 40 MG PO TBEC
40.0000 mg | DELAYED_RELEASE_TABLET | Freq: Every day | ORAL | Status: DC
  Administered 2012-06-22: 40 mg via ORAL
  Filled 2012-06-21: qty 1

## 2012-06-21 MED ORDER — DULOXETINE HCL 60 MG PO CPEP
60.0000 mg | ORAL_CAPSULE | Freq: Every day | ORAL | Status: DC
  Administered 2012-06-22: 60 mg via ORAL
  Filled 2012-06-21 (×2): qty 1

## 2012-06-21 MED ORDER — ONDANSETRON HCL 4 MG PO TABS: 4.0000 mg | ORAL_TABLET | Freq: Three times a day (TID) | ORAL | Status: DC | PRN

## 2012-06-21 MED ORDER — ALUM & MAG HYDROXIDE-SIMETH 200-200-20 MG/5ML PO SUSP: 30.0000 mL | ORAL | Status: DC | PRN

## 2012-06-21 MED ORDER — BACLOFEN 10 MG PO TABS
10.0000 mg | ORAL_TABLET | Freq: Three times a day (TID) | ORAL | Status: DC
  Administered 2012-06-22: 20 mg via ORAL
  Filled 2012-06-21 (×4): qty 2

## 2012-06-21 MED ORDER — GABAPENTIN 800 MG PO TABS
800.0000 mg | ORAL_TABLET | Freq: Three times a day (TID) | ORAL | Status: DC
Start: 2012-06-21 — End: 2012-06-21
  Filled 2012-06-21: qty 1

## 2012-06-21 MED ORDER — MELOXICAM 7.5 MG PO TABS
7.5000 mg | ORAL_TABLET | Freq: Two times a day (BID) | ORAL | Status: DC | PRN
  Administered 2012-06-22: 7.5 mg via ORAL
  Filled 2012-06-21: qty 1

## 2012-06-21 NOTE — ED Notes (Signed)
Informed pt that she is not allowed any bandaids.

## 2012-06-21 NOTE — ED Notes (Signed)
Pt in hallway screaming at police officer and charge RN.

## 2012-06-21 NOTE — ED Notes (Signed)
[id number]<QI> Expected date:<BR> Expected time:<BR> Means of arrival:<BR> Comments:<BR> EMS/Suicidal

## 2012-06-21 NOTE — ED Notes (Signed)
Telepsych consult requested. Pt is next in line.

## 2012-06-21 NOTE — ED Notes (Signed)
Pt transported from home with c/o feeling SHOB today, pt was hyperventilating on arrival. Pt admits she is out of her pain medication, husband lost his job recently and she "has now had enough". Per dispatch pt states she was going to cut herself with unkn object. Pt A & O.

## 2012-06-21 NOTE — ED Notes (Signed)
We moved patient from 75 to 37 she started complaining about her meds and it shouldn't take this long and just give her her clothes so she can go home and kill her self i reported it to the nurse and the charge nurse

## 2012-06-21 NOTE — ED Provider Notes (Signed)
History   This chart was scribed for non-physician practitioner working with Laray Anger, DO by [employer] This patient was seen in room WTR2/WLPT2 and the patient's care was started at 9:13 PM.   CSN: [id number]  Arrival date & time 06/21/12  2031      Chief Complaint  Patient presents with  . Medical Clearance     The history is provided by the patient. No language interpreter was used.   Terri [name] is a 37 y.o. female who presents to the Emergency Department complaining of SI today. Pt reports that she plans to cut herself. Pt reports that she is tired of taking care of elderly relative and her kids. She states she has problems at home due to her boyfriend losing his job recently. She reports that she has constant leg and back pain but doctors will not prescribe her pain medication. Pt denies fever, chills, nausea, vomiting, diarrhea, weakness, cough, SOB and any other pain.   Past Medical History  Diagnosis Date  . Hypertension   . Chronic back pain   . Migraine headache   . Stress incontinence   . Sciatica   . Bipolar disorder     Past Surgical History  Procedure Date  . Fracture surgery   . Back surgery   . Cesarean section   . Gastric bypass   . Ankle surgery   . Lumbar disc surgery   . Lumbar fusion     Family History  Problem Relation Age of Onset  . Diabetes Mother   . Hypertension Mother   . Diabetes Father   . Hypertension Father     History  Substance Use Topics  . Smoking status: Current Every Day Smoker -- 1.0 packs/day    Types: Cigarettes  . Smokeless tobacco: Not on file  . Alcohol Use: No    OB History    Grav Para Term Preterm Abortions TAB SAB Ect Mult Living                  Review of Systems  Constitutional: Negative for fever and chills.  Respiratory: Negative for cough and shortness of breath.   Gastrointestinal: Negative for nausea, vomiting and diarrhea.  Musculoskeletal: Positive for back pain.   Neurological: Negative for weakness.  Psychiatric/Behavioral: Positive for suicidal ideas and self-injury.  All other systems reviewed and are negative.    Allergies  Ketorolac tromethamine and Methocarbamol  Home Medications   Current Outpatient Rx  Name  Route  Sig  Dispense  Refill  . ALBUTEROL SULFATE HFA 108 (90 BASE) MCG/ACT IN AERS   Inhalation   Inhale 2 puffs into the lungs every 6 (six) hours as needed. As needed for shortness of breath.         . AMITRIPTYLINE HCL 50 MG PO TABS   Oral   Take 50 mg by mouth at bedtime.         Marland Kitchen AMLODIPINE BESYLATE 2.5 MG PO TABS   Oral   Take 2.5 mg by mouth daily.         . ASPIRIN-ACETAMINOPHEN-CAFFEINE 250-250-65 MG PO TABS   Oral   Take 2 tablets by mouth every 6 (six) hours as needed. For pain         . BACLOFEN 10 MG PO TABS   Oral   Take 10-20 mg by mouth 3 (three) times daily.          . DULOXETINE HCL 60 MG PO CPEP  Oral   Take 60 mg by mouth daily.         Marland Kitchen GABAPENTIN 800 MG PO TABS   Oral   Take 800 mg by mouth 3 (three) times daily.         . MELOXICAM 7.5 MG PO TABS   Oral   Take 7.5 mg by mouth 2 (two) times daily as needed. As needed for pain. Take with omeprazole.         Carma Leaven M PLUS PO TABS   Oral   Take 1 tablet by mouth daily.           Marland Kitchen OMEPRAZOLE 20 MG PO CPDR   Oral   Take 20 mg by mouth 2 (two) times daily. Take with meloxicam.           BP 157/100  Pulse 87  Temp 98.6 F (37 C) (Oral)  Resp 22  SpO2 100%  LMP 06/19/2012  Physical Exam  Nursing note and vitals reviewed. Constitutional: She is oriented to person, place, and time. She appears well-developed and well-nourished. No distress.  HENT:  Head: Normocephalic and atraumatic.  Eyes: Conjunctivae normal and EOM are normal.  Neck: Neck supple. No tracheal deviation present.  Cardiovascular: Normal rate.   Pulmonary/Chest: Effort normal. No respiratory distress.  Musculoskeletal: Normal range of  motion.  Neurological: She is alert and oriented to person, place, and time.  Skin: Skin is warm and dry.  Psychiatric: Her behavior is normal. Her affect is labile. She exhibits a depressed mood. She expresses suicidal ideation. She expresses no homicidal ideation. She expresses suicidal plans. She expresses no homicidal plans.       Pt will not make eye contact during exam.     ED Course  Procedures (including critical care time) DIAGNOSTIC STUDIES: Oxygen Saturation is 100% on room air, normal by my interpretation.    COORDINATION OF CARE: 9:17 PM Discussed ED treatment with pt and pt agrees.     Labs Reviewed  CBC - Abnormal; Notable for the following:    WBC 11.2 (*)     All other components within normal limits  POCT PREGNANCY, URINE  ACETAMINOPHEN LEVEL  COMPREHENSIVE METABOLIC PANEL  ETHANOL  SALICYLATE LEVEL  URINE RAPID DRUG SCREEN (HOSP PERFORMED)   No results found.   1. Suicidal ideation   2. Chronic pain   3. Depression       MDM  Med rec done. ACT team consulted Holding orders placed.   I personally performed the services described in this documentation, which was scribed in my presence. The recorded information has been reviewed and is accurate.   Dorthula Matas, PA 06/21/12 2122

## 2012-06-21 NOTE — ED Notes (Signed)
Pt calling call bell for bandaids. Requested to see reason for bandaids. Pt reported she has "no cuts... Yet."

## 2012-06-21 NOTE — ED Notes (Signed)
Pt states she is suicidal today, pt has a plan to cut herself. Pt states she is tired of trying to take care of an elderly person, kids, pt states she is in constant pain (back/legs). Pt states she fights with MDs for pain meds. Pt also states her boyfriend is now unemployed. Pt is tearful in triage.

## 2012-06-21 NOTE — ED Notes (Signed)
Patient reports to writer" I want my fucking things so I can go home and slit my fucking wrist". Patient reports" she just want to die"

## 2012-06-22 NOTE — ED Notes (Signed)
Tele-psych consult in process.

## 2012-06-22 NOTE — ED Provider Notes (Signed)
ACT team and involved. Patient stating she is no longer suicidal and requesting to speak with a psychiatrist again. Dr Jacky Kindle reevaluated patient and felt that she is now able to be safely discharged home. He reversed her IVC. He now recommends discharge home with scheduled followup appointment with primary care physician tomorrow. Patient no longer meeting criteria for IVC.   Results for orders placed during the hospital encounter of 06/21/12  ACETAMINOPHEN LEVEL      Component Value Range   Acetaminophen (Tylenol), Serum <15.0  10 - 30 ug/mL  CBC      Component Value Range   WBC 11.2 (*) 4.0 - 10.5 K/uL   RBC 4.42  3.87 - 5.11 MIL/uL   Hemoglobin 13.1  12.0 - 15.0 g/dL   HCT 47.8  29.5 - 62.1 %   MCV 83.0  78.0 - 100.0 fL   MCH 29.6  26.0 - 34.0 pg   MCHC 35.7  30.0 - 36.0 g/dL   RDW 30.8  65.7 - 84.6 %   Platelets 342  150 - 400 K/uL  COMPREHENSIVE METABOLIC PANEL      Component Value Range   Sodium 140  135 - 145 mEq/L   Potassium 3.8  3.5 - 5.1 mEq/L   Chloride 105  96 - 112 mEq/L   CO2 25  19 - 32 mEq/L   Glucose, Bld 82  70 - 99 mg/dL   BUN 10  6 - 23 mg/dL   Creatinine, Ser 9.62  0.50 - 1.10 mg/dL   Calcium 8.8  8.4 - 95.2 mg/dL   Total Protein 6.7  6.0 - 8.3 g/dL   Albumin 3.6  3.5 - 5.2 g/dL   AST 17  0 - 37 U/L   ALT 9  0 - 35 U/L   Alkaline Phosphatase 91  39 - 117 U/L   Total Bilirubin 0.2 (*) 0.3 - 1.2 mg/dL   GFR calc non Af Amer >90  >90 mL/min   GFR calc Af Amer >90  >90 mL/min  ETHANOL      Component Value Range   Alcohol, Ethyl (B) <11  0 - 11 mg/dL  SALICYLATE LEVEL      Component Value Range   Salicylate Lvl <2.0 (*) 2.8 - 20.0 mg/dL  URINE RAPID DRUG SCREEN (HOSP PERFORMED)      Component Value Range   Opiates POSITIVE (*) NONE DETECTED   Cocaine NONE DETECTED  NONE DETECTED   Benzodiazepines NONE DETECTED  NONE DETECTED   Amphetamines NONE DETECTED  NONE DETECTED   Tetrahydrocannabinol POSITIVE (*) NONE DETECTED   Barbiturates NONE DETECTED   NONE DETECTED  POCT PREGNANCY, URINE      Component Value Range   Preg Test, Ur NEGATIVE  NEGATIVE       Sunnie Nielsen, MD 06/22/12 [phone]

## 2012-06-22 NOTE — ED Notes (Addendum)
Pt suicidal ideations were report to psychiatrist. Informed psychiatrist that pt kept asking to be discharged earlier so she can commit suicide and pt requested to be sent to jail rather than stay here.

## 2012-06-22 NOTE — ED Provider Notes (Signed)
Medical screening examination/treatment/procedure(s) were performed by non-physician practitioner and as supervising physician I was immediately available for consultation/collaboration.   Laray Anger, DO 06/22/12 [phone]
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
## 2012-06-22 NOTE — ED Notes (Signed)
Pt given back her 2 patient belongings bag and escorted to security to receive her knife. Pt escorted to waiting room where her friend Peyton Najjar picked her up.

## 2012-06-22 NOTE — ED Provider Notes (Signed)
Psychiatry consultation was reviewed. They agree with IVC. Continue medications as currently prescribed.  Raeford Razor, MD 06/22/12 [phone]

## 2012-06-22 NOTE — ED Notes (Signed)
Pt had tiny circular laceration on anterior lower left hand where it seems pt dug her finger nail into it to make it bleed. Bleeding noted but controlled with one 2x2 gauge.

## 2012-06-26 ENCOUNTER — Ambulatory Visit (INDEPENDENT_AMBULATORY_CARE_PROVIDER_SITE_OTHER): Payer: Medicare Other | Admitting: Psychiatry

## 2012-06-26 ENCOUNTER — Encounter (HOSPITAL_COMMUNITY): Payer: Self-pay | Admitting: Psychiatry

## 2012-06-26 VITALS — BP 135/97 | HR 98 | Ht 69.0 in | Wt 255.6 lb

## 2012-06-26 DIAGNOSIS — F319 Bipolar disorder, unspecified: Secondary | ICD-10-CM

## 2012-06-26 DIAGNOSIS — F1210 Cannabis abuse, uncomplicated: Secondary | ICD-10-CM

## 2012-06-26 MED ORDER — ARIPIPRAZOLE 5 MG PO TABS: 5.0000 mg | ORAL_TABLET | Freq: Every day | ORAL | Status: DC
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
## 2012-07-04 ENCOUNTER — Ambulatory Visit (INDEPENDENT_AMBULATORY_CARE_PROVIDER_SITE_OTHER): Payer: Medicaid Other | Admitting: Psychiatry

## 2012-07-04 ENCOUNTER — Encounter (HOSPITAL_COMMUNITY): Payer: Self-pay | Admitting: Psychiatry

## 2012-07-04 DIAGNOSIS — F329 Major depressive disorder, single episode, unspecified: Secondary | ICD-10-CM
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
## 2012-07-10 ENCOUNTER — Ambulatory Visit (HOSPITAL_COMMUNITY): Payer: Self-pay | Admitting: Psychiatry

## 2012-07-11 ENCOUNTER — Emergency Department (HOSPITAL_COMMUNITY)
Admission: EM | Admit: 2012-07-11 | Discharge: 2012-07-11 | Discharge status: Against Medical Advice | Payer: Medicare Other | Attending: Emergency Medicine | Admitting: Emergency Medicine

## 2012-07-11 ENCOUNTER — Ambulatory Visit (HOSPITAL_COMMUNITY): Payer: Self-pay | Admitting: Psychiatry

## 2012-07-11 ENCOUNTER — Encounter (HOSPITAL_COMMUNITY): Payer: Self-pay | Admitting: Emergency Medicine

## 2012-07-11 DIAGNOSIS — W19XXXA Unspecified fall, initial encounter: Secondary | ICD-10-CM | POA: Insufficient documentation

## 2012-07-11 DIAGNOSIS — Y9389 Activity, other specified: Secondary | ICD-10-CM | POA: Insufficient documentation

## 2012-07-11 DIAGNOSIS — F17200 Nicotine dependence, unspecified, uncomplicated: Secondary | ICD-10-CM | POA: Insufficient documentation

## 2012-07-11 DIAGNOSIS — Z8679 Personal history of other diseases of the circulatory system: Secondary | ICD-10-CM | POA: Insufficient documentation

## 2012-07-11 DIAGNOSIS — Z8739 Personal history of other diseases of the musculoskeletal system and connective tissue: Secondary | ICD-10-CM | POA: Insufficient documentation

## 2012-07-11 DIAGNOSIS — Z791 Long term (current) use of non-steroidal anti-inflammatories (NSAID): Secondary | ICD-10-CM | POA: Insufficient documentation

## 2012-07-11 DIAGNOSIS — I10 Essential (primary) hypertension: Secondary | ICD-10-CM | POA: Insufficient documentation

## 2012-07-11 DIAGNOSIS — N393 Stress incontinence (female) (male): Secondary | ICD-10-CM | POA: Insufficient documentation

## 2012-07-11 DIAGNOSIS — Z9889 Other specified postprocedural states: Secondary | ICD-10-CM | POA: Insufficient documentation

## 2012-07-11 DIAGNOSIS — Y929 Unspecified place or not applicable: Secondary | ICD-10-CM | POA: Insufficient documentation

## 2012-07-11 DIAGNOSIS — Z79899 Other long term (current) drug therapy: Secondary | ICD-10-CM | POA: Insufficient documentation

## 2012-07-11 DIAGNOSIS — G8929 Other chronic pain: Secondary | ICD-10-CM | POA: Insufficient documentation

## 2012-07-11 DIAGNOSIS — IMO0002 Reserved for concepts with insufficient information to code with codable children: Secondary | ICD-10-CM | POA: Insufficient documentation

## 2012-07-11 DIAGNOSIS — M549 Dorsalgia, unspecified: Secondary | ICD-10-CM | POA: Insufficient documentation

## 2012-07-11 DIAGNOSIS — F319 Bipolar disorder, unspecified: Secondary | ICD-10-CM | POA: Insufficient documentation

## 2012-07-11 MED ORDER — IBUPROFEN 800 MG PO TABS
800.0000 mg | ORAL_TABLET | Freq: Once | ORAL | Status: DC
  Filled 2012-07-11: qty 1

## 2012-07-11 MED ORDER — ACETAMINOPHEN 325 MG PO TABS: 650.0000 mg | ORAL_TABLET | Freq: Once | ORAL | Status: DC

## 2012-07-11 NOTE — ED Notes (Signed)
[facility] presents with 37 yo female from home with lower back pain; pt states that she fell from standing position getting up out of a chair but denies LOC or any other injury from fall; Pt has had 4 previous back surgeries last in December and rates back pain 10/10.

## 2012-07-11 NOTE — ED Notes (Signed)
Pt refused to take ibuprofen.

## 2012-07-11 NOTE — ED Notes (Signed)
Pt wants to go home/refusing further treatment

## 2012-07-11 NOTE — ED Provider Notes (Signed)
History     CSN: [id number]  Arrival date & time 07/11/12  2136   First MD Initiated Contact with Patient 07/11/12 2139      Chief Complaint  Patient presents with  . Back Pain    (Consider location/radiation/quality/duration/timing/severity/associated sxs/prior treatment) HPI Comments: 37 year old female with a history of chronic back pain and back surgery in December 2013 presents the emergency department via EMS complaining of lower back pain after sustaining a fall prior to arrival. Patient states she was sitting on her bed and tried to stand up, however her legs gave out underneath her and she fell. Denies hitting her head or loss of consciousness. Triage summary states patient was getting up out of a chair, however she states it was her bed. States the pain is located in her lower back described as sharp and stabbing, rated 10 out of 10 radiating down both of her legs entirely. Any movement makes the pain worse. Denies loss of control of bowels or bladder or saddle anesthesia. She has been to the emergency department complaining of acute on chronic low back pain multiple times within the past 2 months, and her last ED visit on January 29 was for suicidal ideations.  Patient is a 37 y.o. female presenting with back pain. The history is provided by the patient.  Back Pain   Past Medical History  Diagnosis Date  . Hypertension   . Chronic back pain   . Migraine headache   . Stress incontinence   . Sciatica   . Bipolar disorder     Past Surgical History  Procedure Laterality Date  . Fracture surgery    . Back surgery    . Cesarean section    . Gastric bypass    . Ankle surgery    . Lumbar disc surgery    . Lumbar fusion      Family History  Problem Relation Age of Onset  . Diabetes Mother   . Hypertension Mother   . Diabetes Father   . Hypertension Father     History  Substance Use Topics  . Smoking status: Current Every Day Smoker -- 1.00 packs/day    Types:  Cigarettes  . Smokeless tobacco: Not on file  . Alcohol Use: No    OB History   Grav Para Term Preterm Abortions TAB SAB Ect Mult Living                  Review of Systems  HENT: Negative for neck pain and neck stiffness.   Gastrointestinal: Negative.  Negative for nausea and vomiting.  Genitourinary: Negative.   Musculoskeletal: Positive for back pain.  Neurological: Negative for dizziness and light-headedness.  Psychiatric/Behavioral: Negative for confusion.  All other systems reviewed and are negative.    Allergies  Ketorolac tromethamine and Methocarbamol  Home Medications   Current Outpatient Rx  Name  Route  Sig  Dispense  Refill  . albuterol (PROVENTIL HFA;VENTOLIN HFA) 108 (90 BASE) MCG/ACT inhaler   Inhalation   Inhale 2 puffs into the lungs every 6 (six) hours as needed. As needed for shortness of breath.         Marland Kitchen amitriptyline (ELAVIL) 50 MG tablet   Oral   Take 50 mg by mouth at bedtime.         . ARIPiprazole (ABILIFY) 5 MG tablet   Oral   Take 1 tablet (5 mg total) by mouth daily.   30 tablet   0   . baclofen (LIORESAL)  10 MG tablet   Oral   Take 10-20 mg by mouth 3 (three) times daily. 10 twice a day and then 20 mg at night         . DULoxetine (CYMBALTA) 60 MG capsule   Oral   Take 60 mg by mouth daily.         Marland Kitchen gabapentin (NEURONTIN) 800 MG tablet   Oral   Take 800 mg by mouth 3 (three) times daily.         . meloxicam (MOBIC) 7.5 MG tablet   Oral   Take 7.5 mg by mouth 2 (two) times daily as needed. As needed for pain. Take with omeprazole.         . Multiple Vitamins-Minerals (MULTIVITAMINS THER. W/MINERALS) TABS   Oral   Take 1 tablet by mouth daily.           Marland Kitchen omeprazole (PRILOSEC) 20 MG capsule   Oral   Take 20 mg by mouth daily. Take with meloxicam.           BP 165/105  Pulse 100  Temp(Src) 99.2 F (37.3 C) (Oral)  SpO2 100%  LMP 06/24/2012  Physical Exam  Nursing note and vitals  reviewed. Constitutional: She appears well-developed. No distress. Cervical collar and backboard in place.  Morbidly obese  HENT:  Head: Normocephalic and atraumatic.  Eyes: Conjunctivae and EOM are normal. Pupils are equal, round, and reactive to light.  Neck: Normal range of motion. Neck supple. No spinous process tenderness and no muscular tenderness present.  Cardiovascular: Regular rhythm and intact distal pulses.  Tachycardia present.   Pulmonary/Chest: Effort normal and breath sounds normal. No respiratory distress.  Abdominal: Soft. Bowel sounds are normal. There is no tenderness.  Musculoskeletal:       Lumbar back: She exhibits bony tenderness. She exhibits normal pulse.       Back:  Neurological: She is alert. She has normal strength and normal reflexes. No sensory deficit.  Negative SLR b/l  Skin: Skin is warm and dry.  Psychiatric: Her speech is rapid and/or pressured. She is hyperactive. She exhibits a depressed mood.    ED Course  Procedures (including critical care time)  Labs Reviewed - No data to display No results found.   1. Back pain       MDM  Patient refused motrin and tylenol. She left AMA prior to any imaging or further care could be given. I was not informed of patient leaving AMA until 20 minutes after.    Trevor Mace, PA-C 07/11/12 2349

## 2012-07-11 NOTE — ED Notes (Signed)
Pt refused to take acetaminophen

## 2012-07-12 ENCOUNTER — Encounter (HOSPITAL_COMMUNITY): Payer: Self-pay | Admitting: Psychiatry

## 2012-07-12 ENCOUNTER — Ambulatory Visit (INDEPENDENT_AMBULATORY_CARE_PROVIDER_SITE_OTHER): Payer: Medicaid Other | Admitting: Psychiatry

## 2012-07-12 DIAGNOSIS — F329 Major depressive disorder, single episode, unspecified: Secondary | ICD-10-CM

## 2012-07-12 DIAGNOSIS — F32A Depression, unspecified: Secondary | ICD-10-CM
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
## 2012-07-12 NOTE — ED Provider Notes (Signed)
Medical screening examination/treatment/procedure(s) were performed by non-physician practitioner and as supervising physician I was immediately available for consultation/collaboration.  Christopher J. Pollina, MD 07/12/12 1213 

## 2012-07-13 ENCOUNTER — Ambulatory Visit (HOSPITAL_COMMUNITY): Payer: Self-pay | Admitting: Psychiatry

## 2012-07-19 ENCOUNTER — Ambulatory Visit (INDEPENDENT_AMBULATORY_CARE_PROVIDER_SITE_OTHER): Payer: Medicare Other | Admitting: Psychiatry

## 2012-07-19 ENCOUNTER — Encounter (HOSPITAL_COMMUNITY): Payer: Self-pay | Admitting: Psychiatry

## 2012-07-19 DIAGNOSIS — F329 Major depressive disorder, single episode, unspecified: Secondary | ICD-10-CM
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
## 2012-07-20 ENCOUNTER — Encounter (HOSPITAL_COMMUNITY): Payer: Self-pay | Admitting: Psychiatry

## 2012-07-20 ENCOUNTER — Ambulatory Visit (INDEPENDENT_AMBULATORY_CARE_PROVIDER_SITE_OTHER): Payer: Medicare Other | Admitting: Psychiatry

## 2012-07-20 VITALS — BP 136/92 | HR 92 | Wt 249.2 lb

## 2012-07-20 DIAGNOSIS — F319 Bipolar disorder, unspecified: Secondary | ICD-10-CM

## 2012-07-20 MED ORDER — ARIPIPRAZOLE 10 MG PO TABS: 10.0000 mg | ORAL_TABLET | Freq: Every day | ORAL | Status: DC
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
## 2012-08-17 ENCOUNTER — Ambulatory Visit (HOSPITAL_COMMUNITY): Payer: Self-pay | Admitting: Psychiatry

## 2012-08-18 ENCOUNTER — Telehealth (HOSPITAL_COMMUNITY): Payer: Self-pay

## 2012-08-18 NOTE — Telephone Encounter (Signed)
Patient needs appointment for her refills.

## 2012-08-18 NOTE — Telephone Encounter (Signed)
11:16am 08/18/12 Returned phone call - pt left msg on 03/28 @ 9:55am need to r/s appt.  returned call no answer - left msg/sh

## 2012-08-22 ENCOUNTER — Ambulatory Visit (HOSPITAL_COMMUNITY): Payer: Self-pay | Admitting: Psychiatry

## 2012-09-13 ENCOUNTER — Encounter (HOSPITAL_COMMUNITY): Payer: Self-pay | Admitting: *Deleted

## 2012-09-13 ENCOUNTER — Emergency Department (HOSPITAL_COMMUNITY)
Admission: EM | Admit: 2012-09-13 | Discharge: 2012-09-13 | Disposition: A | Discharge status: Home / Self Care | Payer: Medicare Other | Attending: Emergency Medicine | Admitting: Emergency Medicine

## 2012-09-13 DIAGNOSIS — G8929 Other chronic pain: Secondary | ICD-10-CM

## 2012-09-13 DIAGNOSIS — M545 Low back pain, unspecified: Secondary | ICD-10-CM | POA: Insufficient documentation

## 2012-09-13 DIAGNOSIS — Z8739 Personal history of other diseases of the musculoskeletal system and connective tissue: Secondary | ICD-10-CM | POA: Insufficient documentation

## 2012-09-13 DIAGNOSIS — I10 Essential (primary) hypertension: Secondary | ICD-10-CM | POA: Insufficient documentation

## 2012-09-13 DIAGNOSIS — R110 Nausea: Secondary | ICD-10-CM | POA: Insufficient documentation

## 2012-09-13 DIAGNOSIS — G43909 Migraine, unspecified, not intractable, without status migrainosus: Secondary | ICD-10-CM | POA: Insufficient documentation

## 2012-09-13 DIAGNOSIS — K5900 Constipation, unspecified: Secondary | ICD-10-CM | POA: Insufficient documentation

## 2012-09-13 DIAGNOSIS — Z9889 Other specified postprocedural states: Secondary | ICD-10-CM | POA: Insufficient documentation

## 2012-09-13 DIAGNOSIS — Z8659 Personal history of other mental and behavioral disorders: Secondary | ICD-10-CM | POA: Insufficient documentation

## 2012-09-13 DIAGNOSIS — Z8742 Personal history of other diseases of the female genital tract: Secondary | ICD-10-CM | POA: Insufficient documentation

## 2012-09-13 DIAGNOSIS — Z79899 Other long term (current) drug therapy: Secondary | ICD-10-CM | POA: Insufficient documentation

## 2012-09-13 DIAGNOSIS — F17200 Nicotine dependence, unspecified, uncomplicated: Secondary | ICD-10-CM | POA: Insufficient documentation

## 2012-09-13 MED ORDER — IBUPROFEN 800 MG PO TABS: 800.0000 mg | ORAL_TABLET | Freq: Three times a day (TID) | ORAL | Status: DC

## 2012-09-13 MED ORDER — HYDROMORPHONE HCL PF 1 MG/ML IJ SOLN
1.0000 mg | Freq: Once | INTRAMUSCULAR | Status: AC
  Administered 2012-09-13: 1 mg via INTRAVENOUS
  Filled 2012-09-13: qty 1

## 2012-09-13 MED ORDER — ONDANSETRON HCL 4 MG/2ML IJ SOLN
4.0000 mg | Freq: Once | INTRAMUSCULAR | Status: AC
  Administered 2012-09-13: 4 mg via INTRAVENOUS
  Filled 2012-09-13: qty 2

## 2012-09-13 MED ORDER — OXYCODONE-ACETAMINOPHEN 5-325 MG PO TABS
ORAL_TABLET | ORAL | Status: DC
Start: 2012-09-13 — End: 2012-10-17

## 2012-09-13 MED ORDER — OXYCODONE-ACETAMINOPHEN 5-325 MG PO TABS
2.0000 | ORAL_TABLET | Freq: Once | ORAL | Status: AC
  Administered 2012-09-13: 2 via ORAL
  Filled 2012-09-13: qty 2

## 2012-09-13 MED ORDER — IBUPROFEN 800 MG PO TABS
800.0000 mg | ORAL_TABLET | Freq: Once | ORAL | Status: AC
  Administered 2012-09-13: 800 mg via ORAL
  Filled 2012-09-13: qty 1

## 2012-09-13 MED ORDER — DIAZEPAM 5 MG/ML IJ SOLN
5.0000 mg | Freq: Once | INTRAMUSCULAR | Status: AC
  Administered 2012-09-13: 5 mg via INTRAVENOUS
  Filled 2012-09-13: qty 2

## 2012-09-13 NOTE — ED Notes (Signed)
Pt reports hx of back pain, has become more severe since Sunday, having difficulty ambulating.

## 2012-09-13 NOTE — ED Provider Notes (Signed)
History     CSN: [id number]  Arrival date & time 09/13/12  4098   First MD Initiated Contact with Patient 09/13/12 [phone]      Chief Complaint  Patient presents with  . Back Pain    (Consider location/radiation/quality/duration/timing/severity/associated sxs/prior treatment) Patient is a 37 y.o. female presenting with back pain.  Back Pain Associated symptoms: no abdominal pain, no chest pain, no dysuria, no fever, no headaches, no numbness and no weakness   Pt is a 37yo female with hx of chronic back pain and multiple spinal surgeries c/o severe low back pain that is sharp in nature, radiating up her spine.  Reports pain started Sun and has intensified making it difficult to walk.  Last spinal surgery was in [place] in Dec. 2013 but has not f/u with anyone.  Pt was seen 4x at [facility] for back pain since Jan 2014.  Also reports some nausea. Denies fever, vomiting, diarrhea, numbness or tingling in legs.  No chest or abdominal pain.  No urinary symptoms.  No numbness or tingling in groin.   Past Medical History  Diagnosis Date  . Hypertension   . Chronic back pain   . Migraine headache   . Stress incontinence   . Sciatica   . Bipolar disorder     Past Surgical History  Procedure Laterality Date  . Fracture surgery    . Back surgery    . Cesarean section    . Gastric bypass    . Ankle surgery    . Lumbar disc surgery    . Lumbar fusion      Family History  Problem Relation Age of Onset  . Diabetes Mother   . Hypertension Mother   . Diabetes Father   . Hypertension Father     History  Substance Use Topics  . Smoking status: Current Every Day Smoker -- 1.00 packs/day    Types: Cigarettes  . Smokeless tobacco: Not on file  . Alcohol Use: No    OB History   Grav Para Term Preterm Abortions TAB SAB Ect Mult Living                  Review of Systems  Constitutional: Negative for fever and chills.  Respiratory: Negative for chest tightness and shortness of breath.    Cardiovascular: Negative for chest pain.  Gastrointestinal: Positive for nausea and constipation. Negative for vomiting and abdominal pain.  Genitourinary: Negative for dysuria.  Musculoskeletal: Positive for back pain.  Neurological: Negative for weakness, numbness and headaches.    Allergies  Ketorolac tromethamine and Methocarbamol  Home Medications   Current Outpatient Rx  Name  Route  Sig  Dispense  Refill  . amitriptyline (ELAVIL) 50 MG tablet   Oral   Take 50 mg by mouth at bedtime.         . baclofen (LIORESAL) 10 MG tablet   Oral   Take 20 mg by mouth 3 (three) times daily.          . DULoxetine (CYMBALTA) 60 MG capsule   Oral   Take 60 mg by mouth daily.         Marland Kitchen gabapentin (NEURONTIN) 800 MG tablet   Oral   Take 800 mg by mouth 3 (three) times daily.         . meloxicam (MOBIC) 7.5 MG tablet   Oral   Take 7.5 mg by mouth 2 (two) times daily as needed for pain (Takes with omeprazole.).          Marland Kitchen  Multiple Vitamins-Minerals (MULTIVITAMINS THER. W/MINERALS) TABS   Oral   Take 1 tablet by mouth daily.          Marland Kitchen omeprazole (PRILOSEC) 20 MG capsule   Oral   Take 20 mg by mouth daily. Takes with meloxicam.         . albuterol (PROVENTIL HFA;VENTOLIN HFA) 108 (90 BASE) MCG/ACT inhaler   Inhalation   Inhale 2 puffs into the lungs every 6 (six) hours as needed for shortness of breath.          Marland Kitchen ibuprofen (ADVIL,MOTRIN) 800 MG tablet   Oral   Take 1 tablet (800 mg total) by mouth 3 (three) times daily.   21 tablet   0   . oxyCODONE-acetaminophen (PERCOCET/ROXICET) 5-325 MG per tablet      Take 1-2 tablets every 4-6hrs as needed for pain   6 tablet   0     BP 140/94  Pulse 67  Temp(Src) 97.7 F (36.5 C) (Oral)  Resp 18  SpO2 99%  Physical Exam  Nursing note and vitals reviewed. Constitutional: She is oriented to person, place, and time. She appears well-developed and well-nourished. She appears distressed.  Obese female, Pt  was cry, lying in bed.   HENT:  Head: Normocephalic and atraumatic.  Eyes: Conjunctivae are normal. No scleral icterus.  Neck: Normal range of motion. Neck supple. No JVD present. No tracheal deviation present. No thyromegaly present.  Cardiovascular: Normal rate, regular rhythm and normal heart sounds.   Pulmonary/Chest: Breath sounds normal. No stridor. Tachypnea noted. No respiratory distress. She has no wheezes. She has no rales. She exhibits no tenderness.  Abdominal: Soft. Bowel sounds are normal. She exhibits no distension and no mass. There is no tenderness. There is no rebound and no guarding.  Musculoskeletal: She exhibits tenderness ( along lumbar paraspinal muscles and across lowere latissimus muscle). She exhibits no edema.  Antalgic gait   Lymphadenopathy:    She has no cervical adenopathy.  Neurological: She is alert and oriented to person, place, and time. She has normal strength and normal reflexes. She displays normal reflexes. No cranial nerve deficit or sensory deficit. She exhibits normal muscle tone. She displays a negative Romberg sign. Gait ( antalgic) abnormal. Coordination normal.  CN II-XII in tact, 5/5 grip strength, plantar and dorsiflexion in tact, nl sensation, neg romberg, antalgic gait   Skin: Skin is warm and dry. She is not diaphoretic.  Psychiatric:  Tearful     ED Course  Procedures (including critical care time)  Labs Reviewed - No data to display No results found.   1. Chronic back pain       MDM  Pt is tearful c/o severe back pain, unlike any before. Hx of 4 back surgeries, last one was 12/13 in [place].  Does not f/u with anyone.  Denies fever, saddle paresthesia, or incontinence.   PE: TTP along paraspinal muscles of lumbar spine, and across lower latissimus m. Limited flexion & extension at hip due to pain.  Neuro: CN II-XII in tact, PERRL, 5/5 grip strength, plantar and dorsiflexion in tact. neg romberg. Antalgic gait.    Last DG  lumbar spine (1/12) showed: 1. No acute radiographic abnormality of the lumbar spine.  2. Multilevel degenerative disc disease and lumbar spondylosis, as  above.  3. Atherosclerosis.  Low concern for spinal abscess or other emergent cause for pt's current pain.  Likely flare up of chronic back pain.   Tx in ED: diluadid, valium, zofran, ibuprofen.  Will ambulate after pain meds given.  Pt is accompanied by boyfriend.    11:01 AM Pt states her pain went from 10/10 to 8-9/10.  Explained to pt we will not be able to cure her back pain today since it is a chronic problem.  Will try percocet.    11:01 AM Pt able to ambulate.  Neg romberg. Antalgic gait.   Rx: percocet (6 tabs), ibuprofen.  Advised pt she may continue to take previously prescribed baclofen.  Advised not to drive while taking percocet or baclofen.  Plan: manage pt's pain, ambulate, discharge home and have pt f/u with primary care.  Will provide pt with resource guide.  Explained importance of establishing primary care for continued management of pain and HTN.   Vitals: unremarkable. Discharged in stable condition.    Discussed pt with attending during ED encounter.   Junius Finner, PA-C 09/13/12 1101

## 2012-09-13 NOTE — ED Provider Notes (Signed)
Medical screening examination/treatment/procedure(s) were conducted as a shared visit with non-physician practitioner(s) and myself.  I personally evaluated the patient during the encounter  [name], MD 09/13/12 1732

## 2012-09-13 NOTE — ED Provider Notes (Signed)
Patient with low nonradiating back pain becoming worse 3 days ago. Pain is worse with movement or flexion of the waist. Patient has back pain every day for many months. No loss of bladder or bowel control no fever no injury  [name], MD 09/13/12 [phone]

## 2012-10-17 ENCOUNTER — Emergency Department (HOSPITAL_COMMUNITY): Payer: Medicare Other

## 2012-10-17 ENCOUNTER — Emergency Department (HOSPITAL_COMMUNITY)
Admission: EM | Admit: 2012-10-17 | Discharge: 2012-10-17 | Disposition: A | Discharge status: Home / Self Care | Payer: Medicare Other | Attending: Emergency Medicine | Admitting: Emergency Medicine

## 2012-10-17 ENCOUNTER — Encounter (HOSPITAL_COMMUNITY): Payer: Self-pay | Admitting: Emergency Medicine

## 2012-10-17 DIAGNOSIS — Z79899 Other long term (current) drug therapy: Secondary | ICD-10-CM | POA: Insufficient documentation

## 2012-10-17 DIAGNOSIS — G8929 Other chronic pain: Secondary | ICD-10-CM | POA: Insufficient documentation

## 2012-10-17 DIAGNOSIS — Z87448 Personal history of other diseases of urinary system: Secondary | ICD-10-CM | POA: Insufficient documentation

## 2012-10-17 DIAGNOSIS — I10 Essential (primary) hypertension: Secondary | ICD-10-CM | POA: Insufficient documentation

## 2012-10-17 DIAGNOSIS — Z8739 Personal history of other diseases of the musculoskeletal system and connective tissue: Secondary | ICD-10-CM | POA: Insufficient documentation

## 2012-10-17 DIAGNOSIS — M549 Dorsalgia, unspecified: Secondary | ICD-10-CM | POA: Insufficient documentation

## 2012-10-17 DIAGNOSIS — R079 Chest pain, unspecified: Secondary | ICD-10-CM | POA: Insufficient documentation

## 2012-10-17 DIAGNOSIS — F319 Bipolar disorder, unspecified: Secondary | ICD-10-CM | POA: Insufficient documentation

## 2012-10-17 DIAGNOSIS — F17200 Nicotine dependence, unspecified, uncomplicated: Secondary | ICD-10-CM | POA: Insufficient documentation

## 2012-10-17 DIAGNOSIS — Z8679 Personal history of other diseases of the circulatory system: Secondary | ICD-10-CM | POA: Insufficient documentation

## 2012-10-17 LAB — BASIC METABOLIC PANEL
BUN: 8 mg/dL (ref 6–23)
Calcium: 8.8 mg/dL (ref 8.4–10.5)
Chloride: 107 mEq/L (ref 96–112)
Creatinine, Ser: 0.54 mg/dL (ref 0.50–1.10)
GFR calc Af Amer: >90 mL/min (ref >90)
GFR calc non Af Amer: >90 mL/min (ref >90)

## 2012-10-17 LAB — CBC WITH DIFFERENTIAL/PLATELET
Basophils Absolute: 0 10*3/uL (ref 0.0–0.1)
Basophils Relative: 0 % (ref 0–1)
Lymphocytes Relative: 25 % (ref 12–46)
MCHC: 36 g/dL (ref 30.0–36.0)
Monocytes Absolute: 0.7 10*3/uL (ref 0.1–1.0)
Neutro Abs: 8.3 10*3/uL — ABNORMAL HIGH (ref 1.7–7.7)
Neutrophils Relative %: 69 % (ref 43–77)
Platelets: 294 10*3/uL (ref 150–400)
RDW: 17.7 % — ABNORMAL HIGH (ref 11.5–15.5)
WBC: 11.9 10*3/uL — ABNORMAL HIGH (ref 4.0–10.5)

## 2012-10-17 LAB — D-DIMER, QUANTITATIVE: D-Dimer, Quant: <0.27 ug/mL-FEU (ref 0.00–0.48)

## 2012-10-17 LAB — POCT I-STAT TROPONIN I: Troponin i, poc: 0 ng/mL (ref 0.00–0.08)

## 2012-10-17 LAB — TROPONIN I: Troponin I: <0.3 ng/mL (ref <0.30)

## 2012-10-17 MED ORDER — IOHEXOL 350 MG/ML SOLN
100.0000 mL | Freq: Once | INTRAVENOUS | Status: AC | PRN
  Administered 2012-10-17: 100 mL via INTRAVENOUS

## 2012-10-17 MED ORDER — MORPHINE SULFATE 4 MG/ML IJ SOLN
4.0000 mg | Freq: Once | INTRAMUSCULAR | Status: AC
  Administered 2012-10-17: 4 mg via INTRAVENOUS
  Filled 2012-10-17: qty 1

## 2012-10-17 MED ORDER — HYDROCODONE-ACETAMINOPHEN 5-325 MG PO TABS: 1.0000 | ORAL_TABLET | ORAL | Status: DC | PRN

## 2012-10-17 MED ORDER — LORAZEPAM 2 MG/ML IJ SOLN
1.0000 mg | Freq: Once | INTRAMUSCULAR | Status: AC
  Administered 2012-10-17: 1 mg via INTRAVENOUS
  Filled 2012-10-17: qty 1

## 2012-10-17 NOTE — ED Notes (Signed)
Pt to ED via [facility] with c/o chest pain.  Describes pain as sharp in mid chest radiating across both breast and through to back.  Pt st's pain increases with palpation and deep breathing.  EMS gave pt ASA and NTG without relief of pain, only caused headache.

## 2012-10-17 NOTE — ED Notes (Signed)
Pt also st's she has been under a lot of stress in past month.

## 2012-10-17 NOTE — ED Notes (Signed)
Pt st's pain med did not help, requesting more pain med.

## 2012-10-17 NOTE — ED Notes (Signed)
Pt to Xray at this time

## 2012-10-17 NOTE — ED Provider Notes (Signed)
History     CSN: [id number]  Arrival date & time 10/17/12  1716   First MD Initiated Contact with Patient 10/17/12 1717      Chief Complaint  Patient presents with  . Chest Pain    (Consider location/radiation/quality/duration/timing/severity/associated sxs/prior treatment) HPI  Patient is a 37 year old female past medical history significant for hypertension, tobacco use presented to the emergency department for 3 days of constant severe sharp central chest pain with radiation to left arm and upper back. Patient rates pain a 10. Aggravating factors include deep inspiration and movement. No alleviating factors. Pain was not alleviated with nitroglycerin or aspirin given the patient via EMS. Patient denies any previous cardiac workups. Denies nausea, vomiting, diaphoresis. PERC negative.  Past Medical History  Diagnosis Date  . Hypertension   . Chronic back pain   . Migraine headache   . Stress incontinence   . Sciatica   . Bipolar disorder     Past Surgical History  Procedure Laterality Date  . Fracture surgery    . Back surgery    . Cesarean section    . Gastric bypass    . Ankle surgery    . Lumbar disc surgery    . Lumbar fusion      Family History  Problem Relation Age of Onset  . Diabetes Mother   . Hypertension Mother   . Diabetes Father   . Hypertension Father     History  Substance Use Topics  . Smoking status: Current Every Day Smoker -- 1.00 packs/day    Types: Cigarettes  . Smokeless tobacco: Not on file  . Alcohol Use: No    OB History   Grav Para Term Preterm Abortions TAB SAB Ect Mult Living                  Review of Systems  Constitutional: Negative for fever, chills and diaphoresis.  HENT: Negative for neck pain.   Eyes: Negative for visual disturbance.  Respiratory: Positive for chest tightness. Negative for cough.   Cardiovascular: Positive for chest pain. Negative for leg swelling.  Gastrointestinal: Negative for nausea,  vomiting, abdominal pain and diarrhea.  Musculoskeletal: Positive for back pain.  All other systems reviewed and are negative.    Allergies  Ketorolac tromethamine and Methocarbamol  Home Medications   Current Outpatient Rx  Name  Route  Sig  Dispense  Refill  . albuterol (PROVENTIL HFA;VENTOLIN HFA) 108 (90 BASE) MCG/ACT inhaler   Inhalation   Inhale 2 puffs into the lungs every 6 (six) hours as needed for shortness of breath.          Marland Kitchen amLODipine (NORVASC) 5 MG tablet   Oral   Take 5 mg by mouth daily.         . baclofen (LIORESAL) 10 MG tablet   Oral   Take 20 mg by mouth 3 (three) times daily.          Marland Kitchen gabapentin (NEURONTIN) 800 MG tablet   Oral   Take 800 mg by mouth 3 (three) times daily.         Marland Kitchen ibuprofen (ADVIL,MOTRIN) 200 MG tablet   Oral   Take 600 mg by mouth every 6 (six) hours as needed for pain.         . Multiple Vitamins-Minerals (MULTIVITAMINS THER. W/MINERALS) TABS   Oral   Take 1 tablet by mouth daily.          Marland Kitchen HYDROcodone-acetaminophen (NORCO/VICODIN) 5-325 MG per tablet  Oral   Take 1 tablet by mouth every 4 (four) hours as needed for pain.   10 tablet   0     BP 171/130  Temp(Src) 98.6 F (37 C) (Oral)  Resp 24  Ht 5\' 10"  (1.778 m)  Wt 260 lb (117.935 kg)  BMI 37.31 kg/m2  SpO2 100%  LMP 10/04/2012  Physical Exam  Constitutional: She is oriented to person, place, and time. She appears well-developed and well-nourished.  Obese female, crying in bed.  HENT:  Head: Normocephalic and atraumatic.  Eyes: Conjunctivae are normal.  Neck: Neck supple.  Cardiovascular: Normal rate, regular rhythm, normal heart sounds and intact distal pulses.   Pulmonary/Chest: Effort normal and breath sounds normal. No respiratory distress.  Abdominal: Soft. There is no tenderness.  Musculoskeletal: She exhibits no edema.  Neurological: She is alert and oriented to person, place, and time.  Skin: Skin is warm and dry. She is not  diaphoretic.    ED Course  Procedures (including critical care time)   Date: 10/17/2012  Rate: 92  Rhythm: normal sinus rhythm  QRS Axis: normal  Intervals: normal  ST/T Wave abnormalities: normal  Conduction Disutrbances:none  Narrative Interpretation:   Old EKG Reviewed: none available    Labs Reviewed  CBC WITH DIFFERENTIAL - Abnormal; Notable for the following:    WBC 11.9 (*)    HCT 35.3 (*)    MCV 77.8 (*)    RDW 17.7 (*)    Neutro Abs 8.3 (*)    All other components within normal limits  TROPONIN I  BASIC METABOLIC PANEL  D-DIMER, QUANTITATIVE  POCT I-STAT TROPONIN I   Dg Chest 2 View  10/17/2012   *RADIOLOGY REPORT*  Clinical Data: Chest and back pain  CHEST - 2 VIEW  Comparison:  03/27/2004  Findings:  The heart size and mediastinal contours are within normal limits.  Both lungs are clear.  The visualized skeletal structures are unremarkable.  IMPRESSION: No active cardiopulmonary disease.   Original Report Authenticated By: Judie Petit. Miles Costain, M.D.   Ct Angio Chest Aortic Dissect W &/or W/o  10/17/2012   *RADIOLOGY REPORT*  Clinical Data: Chest pain and back pain.  CT ANGIOGRAPHY CHEST  Technique:  Multidetector CT imaging of the chest using the standard protocol during bolus administration of intravenous contrast. Multiplanar reconstructed images including MIPs were obtained and reviewed to evaluate the vascular anatomy.  Contrast: OMNIPAQUE IOHEXOL 350 MG/ML SOLN  Comparison: No priors.  Findings:  Mediastinum: No acute abnormality of the thoracic aorta; specifically, no aneurysm or dissection. Heart size is normal. There is no significant pericardial fluid, thickening or pericardial calcification.  The atherosclerotic calcifications in the proximal left anterior descending coronary artery.  No pathologically enlarged mediastinal or hilar lymph nodes. Esophagus is unremarkable in appearance.    The study was not specifically tailored for evaluation of the pulmonary  arteries, however, no pulmonary embolism is noted.  Lungs/Pleura: Mild diffuse bronchial wall thickening.  Patchy areas of mild air trapping are noted in the lungs bilaterally, suggesting small airways disease.  Small clusters of tree in bud micronodularity are seen scattered throughout the lungs bilaterally, indicative of mucoid impaction within terminal bronchioles; the extent of this is very mild.  No acute consolidative air space disease.  No pleural effusions.  Linear opacity in the right lower lobe is compatible with subsegmental atelectasis and/or scarring.  Upper Abdomen: Postoperative changes near the gastroesophageal junction, suggesting prior gastric bypass surgery.  Musculoskeletal: There are no aggressive appearing  lytic or blastic lesions noted in the visualized portions of the skeleton.  IMPRESSION: 1.  No acute abnormality of the thoracic aorta.  Specifically, no evidence of aneurysm or dissection. 2.  Mild air trapping the lungs bilaterally indicative of small airways disease. 3.  Patchy areas with very mild tree in bud micronodularity in the lungs bilaterally, indicative of mucoid impaction within terminal bronchioles. 4.  Atherosclerosis, including left anterior descending coronary artery disease.  This is likely age advanced in this young female patient.  Please note that although the presence of coronary artery calcium documents the presence of coronary artery disease, the severity of this disease and any potential stenosis cannot be assessed on this non-gated CT examination.  Assessment for potential risk factor modification, dietary therapy or pharmacologic therapy may be warranted, if clinically indicated.   Original Report Authenticated By: Trudie Reed, M.D.     1. Chest pain       MDM  Patient is to be discharged with recommendation to follow up with PCP in regards to today's hospital visit. Chest pain is not likely of cardiac or pulmonary etiology d/t presentation, perc  negative, VSS, no tracheal deviation, no JVD or new murmur, RRR, breath sounds equal bilaterally, EKG without acute abnormalities, negative troponin, and negative CXR and CTA. Pt has been advised to return to the ED if CP becomes exertional, associated with diaphoresis or nausea, radiates to left jaw/arm, worsens or becomes concerning in any way. Pt appears reliable for follow up and is agreeable to discharge. Case has been discussed with and seen by Dr. Manus Gunning who agrees with the above plan to discharge. Patient is stable at time of discharge          Jeannetta Ellis, PA-C 10/18/12 1135

## 2012-10-18 NOTE — ED Provider Notes (Signed)
Medical screening examination/treatment/procedure(s) were conducted as a shared visit with non-physician practitioner(s) and myself.  I personally evaluated the patient during the encounter  3 days of constant sharp chest pain radiating to back.  Worse with movement.  TTP to palpation.  CTAB, RRR.  No pulse or neuro deficits. CXR negative.  EKG nsr. D-dimer negative. BP 142/93  Pulse 79  Temp(Src) 97.8 F (36.6 C) (Oral)  Resp 16  Ht 5\' 10"  (1.778 m)  Wt 260 lb (117.935 kg)  BMI 37.31 kg/m2  SpO2 98%  LMP 10/04/2012   Glynn Octave, MD 10/18/12 1233

## 2012-12-03 ENCOUNTER — Encounter (HOSPITAL_COMMUNITY): Payer: Self-pay | Admitting: *Deleted

## 2012-12-03 ENCOUNTER — Emergency Department (HOSPITAL_COMMUNITY): Payer: Medicare Other

## 2012-12-03 ENCOUNTER — Emergency Department (HOSPITAL_COMMUNITY)
Admission: EM | Admit: 2012-12-03 | Discharge: 2012-12-03 | Disposition: A | Discharge status: Home / Self Care | Payer: Medicare Other | Attending: Emergency Medicine | Admitting: Emergency Medicine

## 2012-12-03 DIAGNOSIS — R296 Repeated falls: Secondary | ICD-10-CM | POA: Insufficient documentation

## 2012-12-03 DIAGNOSIS — M79609 Pain in unspecified limb: Secondary | ICD-10-CM

## 2012-12-03 DIAGNOSIS — S99919A Unspecified injury of unspecified ankle, initial encounter: Secondary | ICD-10-CM | POA: Insufficient documentation

## 2012-12-03 DIAGNOSIS — S8990XA Unspecified injury of unspecified lower leg, initial encounter: Secondary | ICD-10-CM | POA: Insufficient documentation

## 2012-12-03 DIAGNOSIS — M549 Dorsalgia, unspecified: Secondary | ICD-10-CM | POA: Insufficient documentation

## 2012-12-03 DIAGNOSIS — Y929 Unspecified place or not applicable: Secondary | ICD-10-CM | POA: Insufficient documentation

## 2012-12-03 DIAGNOSIS — G8929 Other chronic pain: Secondary | ICD-10-CM | POA: Insufficient documentation

## 2012-12-03 DIAGNOSIS — I10 Essential (primary) hypertension: Secondary | ICD-10-CM | POA: Insufficient documentation

## 2012-12-03 DIAGNOSIS — M25562 Pain in left knee: Secondary | ICD-10-CM

## 2012-12-03 DIAGNOSIS — F319 Bipolar disorder, unspecified: Secondary | ICD-10-CM

## 2012-12-03 DIAGNOSIS — Z79899 Other long term (current) drug therapy: Secondary | ICD-10-CM | POA: Insufficient documentation

## 2012-12-03 DIAGNOSIS — Z8742 Personal history of other diseases of the female genital tract: Secondary | ICD-10-CM | POA: Insufficient documentation

## 2012-12-03 DIAGNOSIS — M5430 Sciatica, unspecified side: Secondary | ICD-10-CM | POA: Insufficient documentation

## 2012-12-03 DIAGNOSIS — Y939 Activity, unspecified: Secondary | ICD-10-CM | POA: Insufficient documentation

## 2012-12-03 DIAGNOSIS — G43909 Migraine, unspecified, not intractable, without status migrainosus: Secondary | ICD-10-CM | POA: Insufficient documentation

## 2012-12-03 DIAGNOSIS — F17200 Nicotine dependence, unspecified, uncomplicated: Secondary | ICD-10-CM | POA: Insufficient documentation

## 2012-12-03 MED ORDER — LORAZEPAM 0.5 MG PO TABS: 0.5000 mg | ORAL_TABLET | Freq: Once | ORAL | Status: DC

## 2012-12-03 MED ORDER — LORAZEPAM 2 MG/ML IJ SOLN
1.0000 mg | Freq: Once | INTRAMUSCULAR | Status: AC
  Administered 2012-12-03: 1 mg via INTRAMUSCULAR
  Filled 2012-12-03: qty 1

## 2012-12-03 MED ORDER — HYDROCODONE-ACETAMINOPHEN 5-325 MG PO TABS: 1.0000 | ORAL_TABLET | Freq: Three times a day (TID) | ORAL | Status: DC | PRN

## 2012-12-03 MED ORDER — OXYCODONE-ACETAMINOPHEN 5-325 MG PO TABS
1.0000 | ORAL_TABLET | Freq: Once | ORAL | Status: AC
  Administered 2012-12-03: 1 via ORAL
  Filled 2012-12-03: qty 1

## 2012-12-03 NOTE — ED Provider Notes (Signed)
Medical screening examination/treatment/procedure(s) were performed by non-physician practitioner and as supervising physician I was immediately available for consultation/collaboration.   David H Yao, MD 12/03/12 2310 

## 2012-12-03 NOTE — ED Notes (Signed)
Per EMS - pt states her left knee frequently gives way and she falls.  Pt has fallen twice in past few days.  Pt c/o left knee pain.  Pt states knee was not hurting before fall today.  No swelling or deformity noted.  Good pulses in LLE and = bilat.  Pt's BP was elevated on scene.  It was 143/90, HR 100.

## 2012-12-03 NOTE — ED Provider Notes (Signed)
History    CSN: [id number] Arrival date & time 12/03/12  1714  First MD Initiated Contact with Patient 12/03/12 1717     Chief Complaint  Patient presents with  . Fall  . Knee Pain    left   (Consider location/radiation/quality/duration/timing/severity/associated sxs/prior Treatment) The history is provided by the patient. No language interpreter was used.  Terri [name] is a 37 y/o F with PMHx of HTN, chronic back pain, migraine, stress incontinence, sciatica, bipolar disorder, depression presenting to the ED with left knee pain that has been ongoing for the past 4-5 days. Patient reported that within the past 4-5 days she has fallen approximately 6 times due to the fact that she feels like her left knee is giving out and is experiencing a lot of pain when applying pressure. Patient reported that the left knee pain is circumferential, described as a pressure with sharp shooting pains, patient stated that the pain reminds her of when she had a blood clot in the back of her knee approximately 10-15 years ago, patient is unable to recall which knee. Stated that it feels like "something pulling, I can't explain" - without radiation. Patient reported that she has been experiencing "electric shocks" running up her left leg from her left foot. Reported that the pain is worse when the patient applies pressure, weight, and motion - pain feels better when she elevates the leg. Reported that every time she has fallen she has landed on her knees or fallen on her buttocks or with her legs tucked under her. Denied head injury, headache, LOC, blurred vision, visual distortions, amnesia, knee surgery, previous knee issues, chest pain, shortness of breath, difficulty breathing.  PCP Dr. Fleet Contras    Past Medical History  Diagnosis Date  . Hypertension   . Chronic back pain   . Migraine headache   . Stress incontinence   . Sciatica   . Bipolar disorder    Past Surgical History  Procedure Laterality  Date  . Fracture surgery    . Back surgery    . Cesarean section    . Gastric bypass    . Ankle surgery    . Lumbar disc surgery    . Lumbar fusion     Family History  Problem Relation Age of Onset  . Diabetes Mother   . Hypertension Mother   . Diabetes Father   . Hypertension Father    History  Substance Use Topics  . Smoking status: Current Every Day Smoker -- 1.00 packs/day    Types: Cigarettes  . Smokeless tobacco: Not on file  . Alcohol Use: No   OB History   Grav Para Term Preterm Abortions TAB SAB Ect Mult Living                 Review of Systems  Constitutional: Negative for fever and chills.  HENT: Negative for sore throat, trouble swallowing, neck pain and neck stiffness.   Eyes: Negative for pain and visual disturbance.  Respiratory: Negative for cough, chest tightness and shortness of breath.   Cardiovascular: Negative for chest pain.  Gastrointestinal: Negative for nausea, vomiting, abdominal pain and diarrhea.  Musculoskeletal: Positive for arthralgias (left knee). Negative for back pain.  Neurological: Negative for dizziness, light-headedness, numbness and headaches.  All other systems reviewed and are negative.    Allergies  Ketorolac tromethamine and Methocarbamol  Home Medications   Current Outpatient Rx  Name  Route  Sig  Dispense  Refill  . albuterol (  PROVENTIL HFA;VENTOLIN HFA) 108 (90 BASE) MCG/ACT inhaler   Inhalation   Inhale 2 puffs into the lungs every 6 (six) hours as needed for shortness of breath.          Marland Kitchen amitriptyline (ELAVIL) 50 MG tablet   Oral   Take 50 mg by mouth at bedtime.         Marland Kitchen amLODipine (NORVASC) 5 MG tablet   Oral   Take 5 mg by mouth daily.         . baclofen (LIORESAL) 10 MG tablet   Oral   Take 10 mg by mouth 3 (three) times daily.          . cetirizine (ZYRTEC) 10 MG tablet   Oral   Take 10 mg by mouth daily.         . cyclobenzaprine (FLEXERIL) 5 MG tablet   Oral   Take 5 mg by mouth 3  (three) times daily as needed for muscle spasms.         . DULoxetine (CYMBALTA) 60 MG capsule   Oral   Take 60 mg by mouth daily.         Marland Kitchen gabapentin (NEURONTIN) 300 MG capsule   Oral   Take 300 mg by mouth 3 (three) times daily.         . hydrOXYzine (ATARAX/VISTARIL) 25 MG tablet   Oral   Take 25 mg by mouth 2 (two) times daily as needed for itching.         Marland Kitchen ibuprofen (ADVIL,MOTRIN) 200 MG tablet   Oral   Take 600 mg by mouth every 6 (six) hours as needed for pain.         . meloxicam (MOBIC) 7.5 MG tablet   Oral   Take 7.5 mg by mouth 2 (two) times daily as needed for pain.         . Multiple Vitamins-Minerals (MULTIVITAMINS THER. W/MINERALS) TABS   Oral   Take 1 tablet by mouth daily.          . Olopatadine HCl (PATADAY) 0.2 % SOLN   Ophthalmic   Apply 1 drop to eye as needed.         Marland Kitchen omeprazole (PRILOSEC) 20 MG capsule   Oral   Take 20 mg by mouth daily.         Marland Kitchen HYDROcodone-acetaminophen (NORCO) 5-325 MG per tablet   Oral   Take 1 tablet by mouth every 8 (eight) hours as needed for pain.   5 tablet   0    BP 150/103  Pulse 86  Temp(Src) 98.4 F (36.9 C) (Oral)  Resp 18  Ht 5\' 9"  (1.753 m)  Wt 260 lb (117.935 kg)  BMI 38.38 kg/m2  SpO2 98%  LMP 11/23/2012 Physical Exam  Nursing note and vitals reviewed. Constitutional: She is oriented to person, place, and time. She appears well-developed and well-nourished. No distress.  HENT:  Head: Normocephalic and atraumatic.  Eyes: Conjunctivae are normal. Pupils are equal, round, and reactive to light.  Neck: Normal range of motion. Neck supple.  Cardiovascular: Normal rate, regular rhythm and normal heart sounds.  Exam reveals no friction rub.   No murmur heard. Pulses:      Radial pulses are 2+ on the right side, and 2+ on the left side.       Dorsalis pedis pulses are 2+ on the right side, and 2+ on the left side.  Pulmonary/Chest: Effort normal and breath sounds normal. No  respiratory distress. She has no wheezes. She has no rales.  Musculoskeletal: She exhibits tenderness. She exhibits no edema.  Negative swelling, erythema, inflammation, warmth upon palpation to the left knee.  Pain is circumferential to palpation of the left knee.  Patient is able to flex and extend the knee with discomfort noted  Negative anterior and posterior draw sign Positive pain with valgus and varus tension  Pain upon palpation to the left calf, ankle and foot.  Full ROM to the left ankle and toes  Strength 5+/5+ with resistance  Neurological: She is alert and oriented to person, place, and time. She exhibits normal muscle tone. Coordination normal.  Sensation intact with differentiation to sharp and dull touch  Skin: Skin is warm and dry. No rash noted. She is not diaphoretic. No erythema.  Psychiatric: She has a normal mood and affect. Her behavior is normal. Thought content normal.    ED Course  Procedures (including critical care time)  Medications  oxyCODONE-acetaminophen (PERCOCET/ROXICET) 5-325 MG per tablet 1 tablet (1 tablet Oral Given 12/03/12 1833)  LORazepam (ATIVAN) injection 1 mg (1 mg Intramuscular Given 12/03/12 2007)    Labs Reviewed - No data to display Dg Ankle Complete Left  12/03/2012   *RADIOLOGY REPORT*  Clinical Data: Injury.  Right ankle pain.  LEFT ANKLE COMPLETE - 3+ VIEW  Comparison: Plain films right ankle 04/11/2005.  Findings: There is no acute bony or joint abnormality.  Old healed distal fibular fracture with hardware in place again seen.  Small corticated bony fragments off the medial malleolus are likely due to old trauma.  IMPRESSION: No acute finding.  Stable compared to prior exam.   Original Report Authenticated By: Holley Dexter, M.D.   Dg Knee Complete 4 Views Left  12/03/2012   *RADIOLOGY REPORT*  Clinical Data: Fall.  Left knee pain.  LEFT KNEE - COMPLETE 4+ VIEW  Comparison: None.  Findings: Imaged bones, joints and soft tissues  appear normal.  IMPRESSION: Negative examination.   Original Report Authenticated By: Holley Dexter, M.D.   Dg Foot Complete Left  12/03/2012   *RADIOLOGY REPORT*  Clinical Data: Fall.  Left foot pain.  LEFT FOOT - COMPLETE 3+ VIEW  Comparison: None.  Findings: No acute bony or joint abnormality is identified. Hardware fixing an old healed distal fibular fracture is noted. Soft tissue structures are unremarkable.  IMPRESSION: No acute abnormality.   Original Report Authenticated By: Holley Dexter, M.D.   1. Knee pain, left   2. HTN (hypertension)   3. Migraine   4. Sciatica, unspecified laterality   5. Bipolar disorder     MDM  Patient is a 37 y/o F presenting to the ED with left knee pain that has been ongoing for the past 4-5 days, with reportedly falling at least 6 times within these 4-5 days due to left knee pain and feeling like her knee is giving out. Denied head trauma, LOC, amnesia, previous knee injuries.  Negative swelling, erythema, inflammation, ecchymosis, warmth upon palpation, deformity noted to the left knee. Pain is circumferential to the left knee upon palpation - as well as pain to the left calf and left foot. Patient able to flex and extend left knee with discomfort - full ROM to left ankle and toes. Strength 5+/5+. Negative neurological deficits noted. Pulses palpable, DP 2+. Stable left knee joint. Imaging noted negative acute abnormalities - negative dislocations, fractures, effusion. Negative blood clots noted to LE doppler. Discussed case and findings with Dr. Cheri Rous - cleared  patient for discharge. Discomfort controlled in ED setting. Patient placed in knee sleeve and crutches given for comfort. Suspicion to be possible ligamental injury - etiology of left knee pain unknown. Doubt septic joint. Patient stable, afebrile - blood pressure mildly elevated - reviewed patient's charts and noticed that this is a trend for patient to run 150's/low-100's. Discharged patient with  referral to PCP and orthopedics. Discharged patient with small dose of pain medications - discussed with patient the course, precautions, disposal techniques. Discussed with patient to rest and stay hydrated. Discussed with patient to prop with leg mildly flexed. Discussed with patient to continue to monitor symptoms and if symptoms are to worsen or change to report back to the ED - strict return instructions given. Patient agreed to plan of care, understood, all questions answered.   Raymon Mutton, PA-C 12/03/12 2146

## 2012-12-03 NOTE — Progress Notes (Signed)
VASCULAR LAB PRELIMINARY  PRELIMINARY  PRELIMINARY  PRELIMINARY  Left lower extremity venous Doppler completed.    Preliminary report:  There is no DVT or SVT noted in the left lower extremity.  Hendrick Pavich, RVT 12/03/2012, 6:43 PM

## 2012-12-19 ENCOUNTER — Ambulatory Visit: Payer: Medicare Other | Admitting: Physical Therapy

## 2012-12-21 ENCOUNTER — Other Ambulatory Visit (HOSPITAL_COMMUNITY): Payer: Self-pay | Admitting: Psychiatry

## 2012-12-21 ENCOUNTER — Telehealth (HOSPITAL_COMMUNITY): Payer: Self-pay

## 2012-12-21 NOTE — Telephone Encounter (Signed)
Patient need to be seen for her refills.

## 2012-12-28 ENCOUNTER — Emergency Department (HOSPITAL_COMMUNITY)
Admission: EM | Admit: 2012-12-28 | Discharge: 2012-12-28 | Disposition: A | Discharge status: Home / Self Care | Payer: Medicare Other | Attending: Emergency Medicine | Admitting: Emergency Medicine

## 2012-12-28 ENCOUNTER — Emergency Department (HOSPITAL_COMMUNITY): Payer: Medicare Other

## 2012-12-28 ENCOUNTER — Ambulatory Visit: Payer: Medicare Other | Admitting: Physical Therapy

## 2012-12-28 ENCOUNTER — Encounter (HOSPITAL_COMMUNITY): Payer: Self-pay | Admitting: Emergency Medicine

## 2012-12-28 DIAGNOSIS — G8929 Other chronic pain: Secondary | ICD-10-CM | POA: Insufficient documentation

## 2012-12-28 DIAGNOSIS — Z8739 Personal history of other diseases of the musculoskeletal system and connective tissue: Secondary | ICD-10-CM | POA: Insufficient documentation

## 2012-12-28 DIAGNOSIS — Z8742 Personal history of other diseases of the female genital tract: Secondary | ICD-10-CM | POA: Insufficient documentation

## 2012-12-28 DIAGNOSIS — IMO0002 Reserved for concepts with insufficient information to code with codable children: Secondary | ICD-10-CM | POA: Insufficient documentation

## 2012-12-28 DIAGNOSIS — G510 Bell's palsy: Secondary | ICD-10-CM

## 2012-12-28 DIAGNOSIS — Z79899 Other long term (current) drug therapy: Secondary | ICD-10-CM | POA: Insufficient documentation

## 2012-12-28 DIAGNOSIS — I10 Essential (primary) hypertension: Secondary | ICD-10-CM | POA: Insufficient documentation

## 2012-12-28 DIAGNOSIS — F319 Bipolar disorder, unspecified: Secondary | ICD-10-CM | POA: Insufficient documentation

## 2012-12-28 DIAGNOSIS — G43909 Migraine, unspecified, not intractable, without status migrainosus: Secondary | ICD-10-CM | POA: Insufficient documentation

## 2012-12-28 DIAGNOSIS — F17200 Nicotine dependence, unspecified, uncomplicated: Secondary | ICD-10-CM | POA: Insufficient documentation

## 2012-12-28 MED ORDER — PREDNISONE 20 MG PO TABS: 60.0000 mg | ORAL_TABLET | Freq: Every day | ORAL | Status: DC

## 2012-12-28 MED ORDER — PREDNISONE 20 MG PO TABS
60.0000 mg | ORAL_TABLET | Freq: Once | ORAL | Status: AC
  Administered 2012-12-28: 60 mg via ORAL
  Filled 2012-12-28: qty 3

## 2012-12-28 MED ORDER — VALACYCLOVIR HCL 1 G PO TABS: 1000.0000 mg | ORAL_TABLET | Freq: Three times a day (TID) | ORAL | Status: AC

## 2012-12-28 MED ORDER — VALACYCLOVIR HCL 500 MG PO TABS
1000.0000 mg | ORAL_TABLET | Freq: Once | ORAL | Status: AC
  Administered 2012-12-28: 1000 mg via ORAL
  Filled 2012-12-28: qty 2

## 2012-12-28 NOTE — ED Provider Notes (Signed)
CSN: [id number]     Arrival date & time 12/28/12  1504 History     First MD Initiated Contact with Patient 12/28/12 [phone]     Chief Complaint  Patient presents with  . Facial Droop   (Consider location/radiation/quality/duration/timing/severity/associated sxs/prior Treatment) HPI Comments: Pt comes in with cc of facial drooping, right sided since Tuesday. Pt has hx of HTN, Migraines, and she is smoker - no hx of strokes. She reports that she noticed the drooping on Tuesday. No resolution of sx seen. No other sx associated with this. Pt does complain of moderate headaches, and has hx of migraines - but no neuro deficits in the past.    The history is provided by the patient.    Past Medical History  Diagnosis Date  . Hypertension   . Chronic back pain   . Migraine headache   . Stress incontinence   . Sciatica   . Bipolar disorder    Past Surgical History  Procedure Laterality Date  . Fracture surgery    . Back surgery    . Cesarean section    . Gastric bypass    . Ankle surgery    . Lumbar disc surgery    . Lumbar fusion     Family History  Problem Relation Age of Onset  . Diabetes Mother   . Hypertension Mother   . Diabetes Father   . Hypertension Father    History  Substance Use Topics  . Smoking status: Current Every Day Smoker -- 1.00 packs/day    Types: Cigarettes  . Smokeless tobacco: Not on file  . Alcohol Use: No   OB History   Grav Para Term Preterm Abortions TAB SAB Ect Mult Living                 Review of Systems  Constitutional: Negative for activity change.  HENT: Negative for facial swelling and neck pain.   Respiratory: Negative for cough, shortness of breath and wheezing.   Cardiovascular: Negative for chest pain.  Gastrointestinal: Negative for nausea, vomiting, abdominal pain, diarrhea, constipation, blood in stool and abdominal distention.  Genitourinary: Negative for hematuria and difficulty urinating.  Skin: Negative for color change.   Neurological: Positive for facial asymmetry, weakness and headaches. Negative for speech difficulty and numbness.  Hematological: Does not bruise/bleed easily.  Psychiatric/Behavioral: Negative for confusion.    Allergies  Ketorolac tromethamine and Methocarbamol  Home Medications   Current Outpatient Rx  Name  Route  Sig  Dispense  Refill  . amitriptyline (ELAVIL) 50 MG tablet   Oral   Take 50 mg by mouth at bedtime.         Marland Kitchen amLODipine (NORVASC) 5 MG tablet   Oral   Take 5 mg by mouth daily.         . baclofen (LIORESAL) 10 MG tablet   Oral   Take 10 mg by mouth 3 (three) times daily.          . cetirizine (ZYRTEC) 10 MG tablet   Oral   Take 10 mg by mouth daily.         . DULoxetine (CYMBALTA) 60 MG capsule   Oral   Take 60 mg by mouth daily.         Marland Kitchen gabapentin (NEURONTIN) 300 MG capsule   Oral   Take 300 mg by mouth 3 (three) times daily.         Marland Kitchen ibuprofen (ADVIL,MOTRIN) 200 MG tablet   Oral  Take 600-800 mg by mouth every 6 (six) hours as needed for headache.          . meloxicam (MOBIC) 7.5 MG tablet   Oral   Take 7.5 mg by mouth 2 (two) times daily.          . Multiple Vitamins-Minerals (MULTIVITAMINS THER. W/MINERALS) TABS   Oral   Take 1 tablet by mouth daily.          Marland Kitchen omeprazole (PRILOSEC) 20 MG capsule   Oral   Take 20 mg by mouth daily.         Marland Kitchen oxyCODONE-acetaminophen (PERCOCET/ROXICET) 5-325 MG per tablet   Oral   Take 1 tablet by mouth 4 (four) times daily.         Marland Kitchen albuterol (PROVENTIL HFA;VENTOLIN HFA) 108 (90 BASE) MCG/ACT inhaler   Inhalation   Inhale 2 puffs into the lungs every 6 (six) hours as needed for shortness of breath.          . predniSONE (DELTASONE) 20 MG tablet   Oral   Take 3 tablets (60 mg total) by mouth daily.   18 tablet   0   . valACYclovir (VALTREX) 1000 MG tablet   Oral   Take 1 tablet (1,000 mg total) by mouth 3 (three) times daily.   29 tablet   0    BP 144/107   Temp(Src) 99 F (37.2 C) (Oral)  Resp 16  SpO2 99%  LMP 11/23/2012 Physical Exam  Nursing note and vitals reviewed. Constitutional: She is oriented to person, place, and time. She appears well-developed and well-nourished.  HENT:  Head: Normocephalic and atraumatic.  Eyes: EOM are normal. Pupils are equal, round, and reactive to light.  Neck: Neck supple.  Cardiovascular: Normal rate, regular rhythm and normal heart sounds.   No murmur heard. Pulmonary/Chest: Effort normal. No respiratory distress.  Abdominal: Soft. She exhibits no distension. There is no tenderness. There is no rebound and no guarding.  Neurological: She is alert and oriented to person, place, and time.  Cerebellar exam is normal (finger to nose) Sensory exam normal for bilateral upper and lower extremities - and patient is able to discriminate between sharp and dull. Motor exam is 4+/5  PATIENT HAS RIGHT SIDED FACIAL DROOP. NO CENTRAL SPARING.  Skin: Skin is warm and dry.    ED Course   Procedures (including critical care time)  Labs Reviewed - No data to display Ct Head Wo Contrast  12/28/2012   *RADIOLOGY REPORT*  Clinical Data: Right-sided facial droop  CT HEAD WITHOUT CONTRAST  Technique:  Contiguous axial images were obtained from the base of the skull through the vertex without contrast.  Comparison: None.  Findings: The bony calvarium is intact.  No gross soft tissue abnormality is seen.  No acute hemorrhage, acute infarction or space-occupying mass lesion is noted.  IMPRESSION: No acute abnormality noted.   Original Report Authenticated By: Alcide Clever, M.D.   1. Bell's palsy     MDM  Pt comes in with cc of facial droop. Not central sparing, CT head is normal. No other neuro deficits. Appears to be a Bell's Palsy.  Will start the prednisone and anti viral now.  Pt to see PCP or Neurologist in 1 week.   Derwood Kaplan, MD 12/28/12 1630

## 2012-12-28 NOTE — ED Notes (Signed)
Pt st's she first noticed facial droop 3 days ago when she noticed that her eye would not close when she winked.  Pt only c/o pain in low back which is chronic.  Pt alert and oriented x's 3, skin warm and dry color appropriate.

## 2012-12-28 NOTE — ED Notes (Signed)
Pt requesting pain med for her back pain.  St's she can call someone to bring her pain meds from home.  Advised her that the MD needed to see her first.  Pt voices understanding.

## 2012-12-28 NOTE — ED Notes (Signed)
Patientt coming from PT office for herniated disc. When there they noticed right sided facial drooping. Patient says it has been going on since Tuesday. No other neuro deficits. Say she had bad headache in forehead region. Constant headache for 4 days. Has hx of migraines but says this is different.

## 2013-01-03 ENCOUNTER — Ambulatory Visit: Payer: Medicare Other | Attending: Anesthesiology | Admitting: Physical Therapy

## 2013-01-03 DIAGNOSIS — R262 Difficulty in walking, not elsewhere classified: Secondary | ICD-10-CM | POA: Insufficient documentation

## 2013-01-03 DIAGNOSIS — M545 Low back pain, unspecified: Secondary | ICD-10-CM | POA: Insufficient documentation

## 2013-01-03 DIAGNOSIS — IMO0001 Reserved for inherently not codable concepts without codable children: Secondary | ICD-10-CM | POA: Insufficient documentation

## 2013-01-03 DIAGNOSIS — R5381 Other malaise: Secondary | ICD-10-CM | POA: Insufficient documentation

## 2013-01-09 ENCOUNTER — Encounter: Payer: Self-pay | Admitting: Physical Therapy

## 2013-01-11 ENCOUNTER — Ambulatory Visit: Payer: Medicare Other | Admitting: Physical Therapy

## 2013-01-15 IMAGING — CR DG LUMBAR SPINE COMPLETE 4+V
5 series · 5 of 5 positions shown · non-contrast
Comparison: MRI of the lumbar spine 04/24/2012.

CLINICAL DATA: History of fall complaining of low back pain.

LUMBAR SPINE - COMPLETE 4+ VIEW

[t l-spine a.p.]
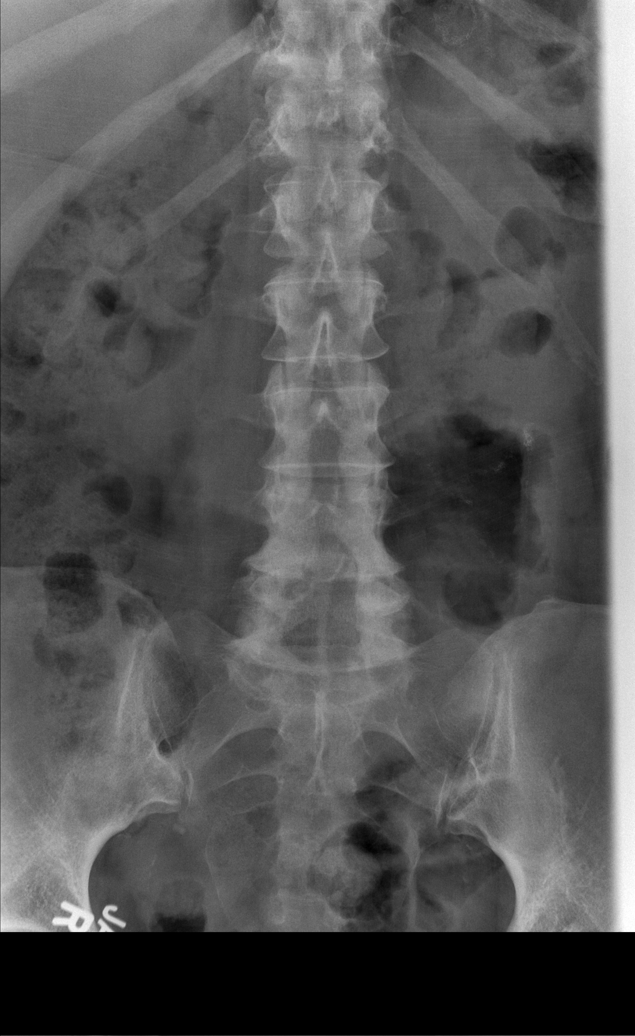

[t l-spine oblique exposure (1 of 2)]
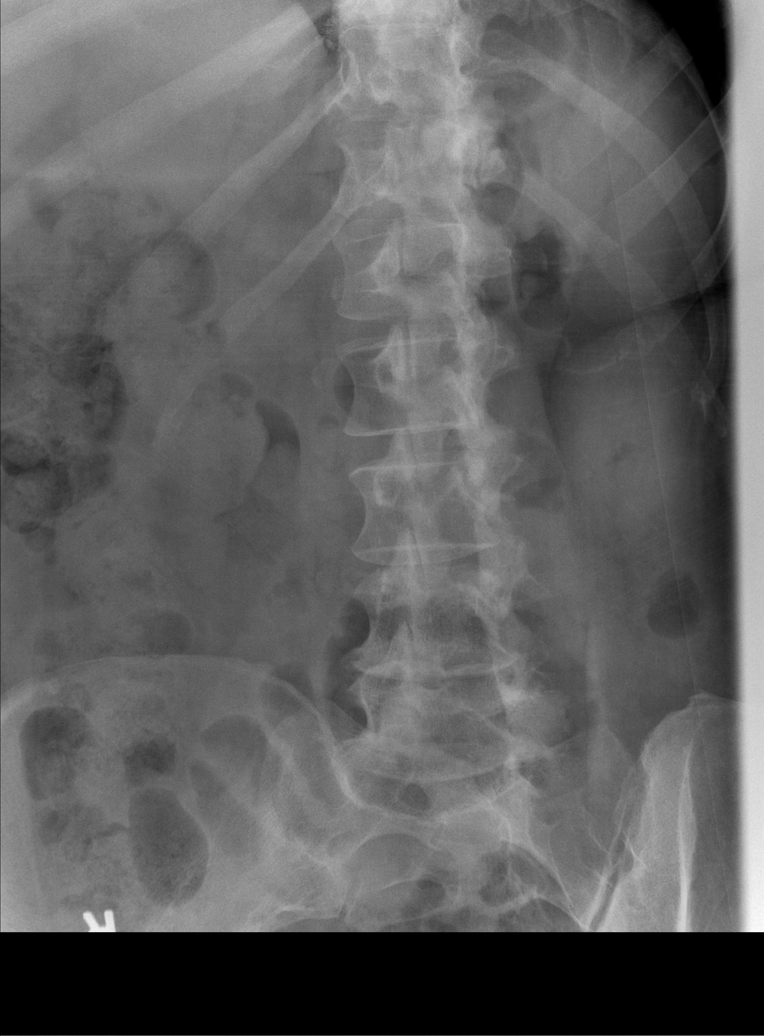

[t l-spine oblique exposure (2 of 2)]
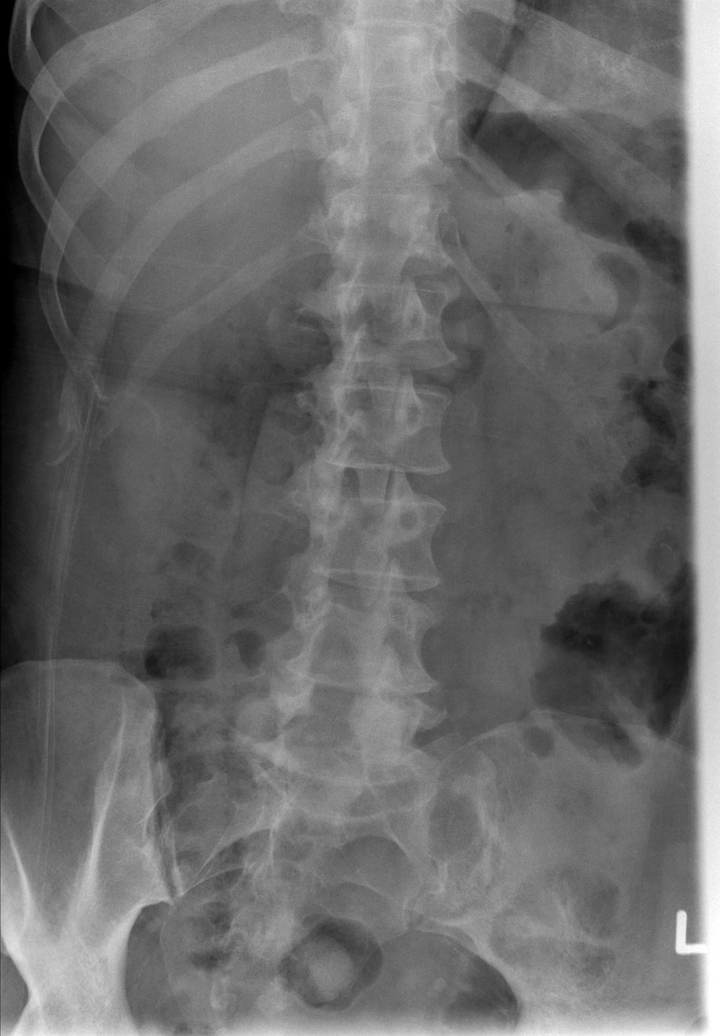

[t l-spine lat *]
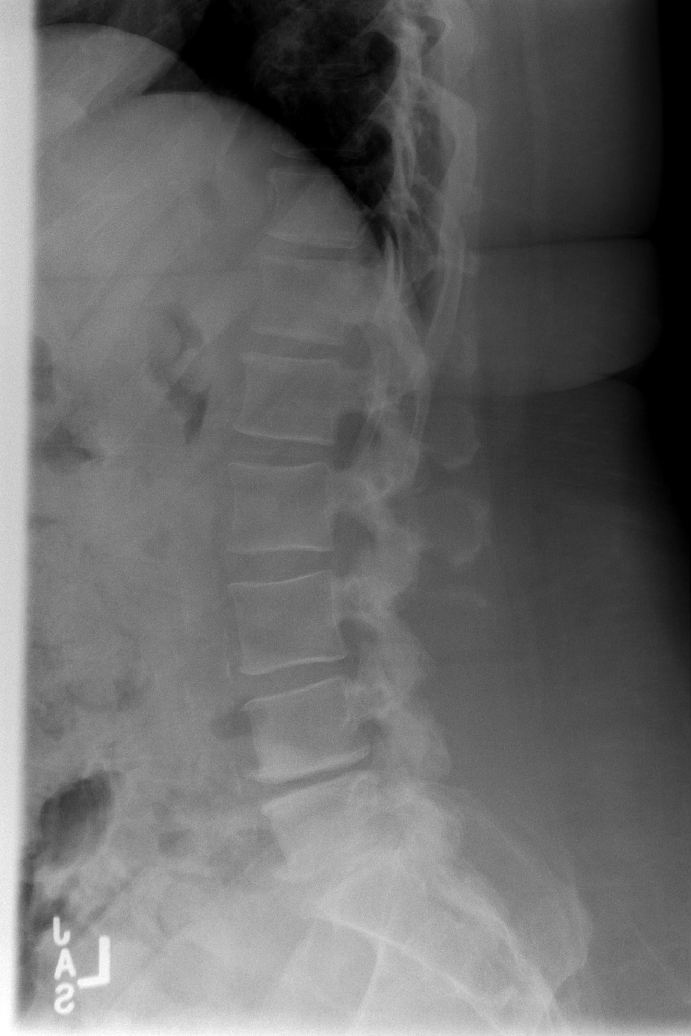

[t l-spine l5-s1 spot *]
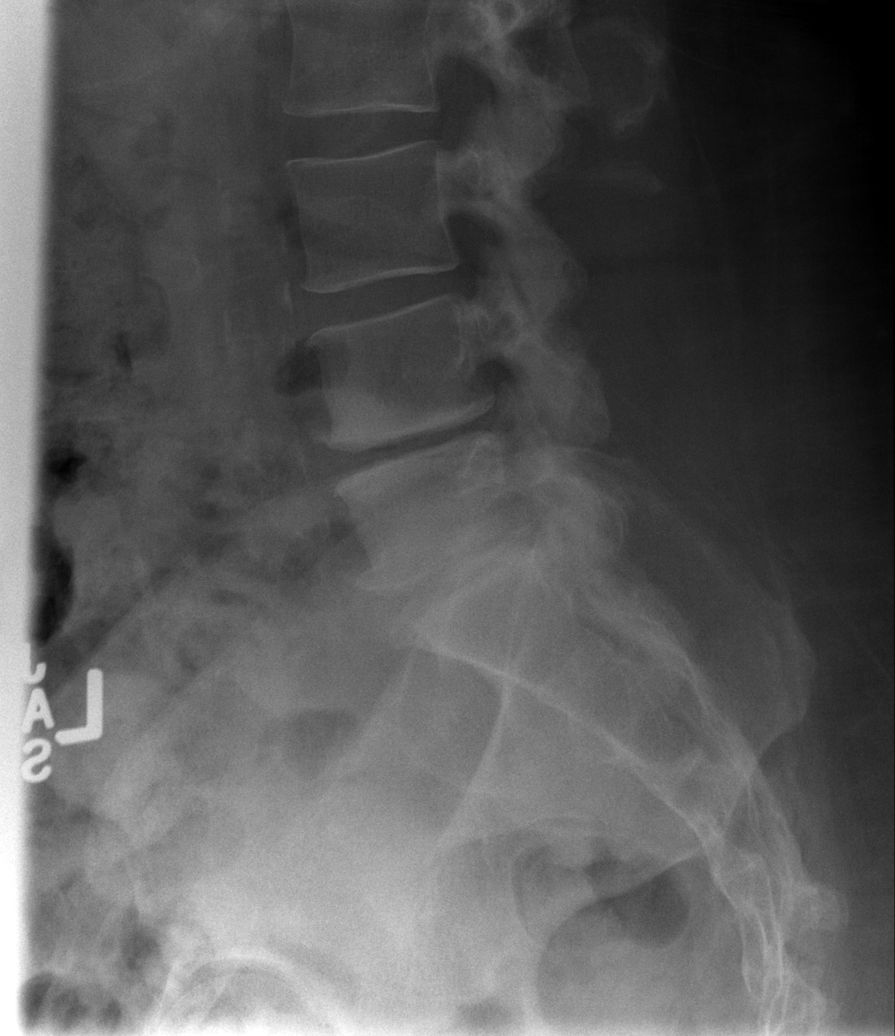

[5 of 5 positions shown; findings below may reference images not displayed]

FINDINGS: Five views of the lumbar spine demonstrate no acute
displaced fracture or definite compression type fracture.  There is
multilevel degenerative disc disease and facet arthropathy, most
severe at L4-L5 and L5-S1.  No defects of the pars interarticularis
are noted. Status post laminectomy from L3-L5.  Alignment is
anatomic.  Atherosclerosis in the abdominal and pelvic vasculature.
IMPRESSION: 1.  No acute radiographic abnormality of the lumbar spine.
2.  Multilevel degenerative disc disease and lumbar spondylosis, as
above.
3.  Atherosclerosis.

## 2013-01-16 ENCOUNTER — Encounter (HOSPITAL_COMMUNITY): Payer: Self-pay

## 2013-01-16 ENCOUNTER — Emergency Department (HOSPITAL_COMMUNITY)
Admission: EM | Admit: 2013-01-16 | Discharge: 2013-01-16 | Disposition: A | Discharge status: Home / Self Care | Payer: Medicare Other | Attending: Emergency Medicine | Admitting: Emergency Medicine

## 2013-01-16 DIAGNOSIS — R209 Unspecified disturbances of skin sensation: Secondary | ICD-10-CM | POA: Insufficient documentation

## 2013-01-16 DIAGNOSIS — Z791 Long term (current) use of non-steroidal anti-inflammatories (NSAID): Secondary | ICD-10-CM | POA: Insufficient documentation

## 2013-01-16 DIAGNOSIS — Y9389 Activity, other specified: Secondary | ICD-10-CM | POA: Insufficient documentation

## 2013-01-16 DIAGNOSIS — Z79899 Other long term (current) drug therapy: Secondary | ICD-10-CM | POA: Insufficient documentation

## 2013-01-16 DIAGNOSIS — F17200 Nicotine dependence, unspecified, uncomplicated: Secondary | ICD-10-CM | POA: Insufficient documentation

## 2013-01-16 DIAGNOSIS — W19XXXA Unspecified fall, initial encounter: Secondary | ICD-10-CM

## 2013-01-16 DIAGNOSIS — Y929 Unspecified place or not applicable: Secondary | ICD-10-CM | POA: Insufficient documentation

## 2013-01-16 DIAGNOSIS — I10 Essential (primary) hypertension: Secondary | ICD-10-CM | POA: Insufficient documentation

## 2013-01-16 DIAGNOSIS — G43909 Migraine, unspecified, not intractable, without status migrainosus: Secondary | ICD-10-CM | POA: Insufficient documentation

## 2013-01-16 DIAGNOSIS — Z8739 Personal history of other diseases of the musculoskeletal system and connective tissue: Secondary | ICD-10-CM | POA: Insufficient documentation

## 2013-01-16 DIAGNOSIS — IMO0002 Reserved for concepts with insufficient information to code with codable children: Secondary | ICD-10-CM | POA: Insufficient documentation

## 2013-01-16 DIAGNOSIS — Z8742 Personal history of other diseases of the female genital tract: Secondary | ICD-10-CM | POA: Insufficient documentation

## 2013-01-16 DIAGNOSIS — M545 Low back pain, unspecified: Secondary | ICD-10-CM

## 2013-01-16 DIAGNOSIS — G8929 Other chronic pain: Secondary | ICD-10-CM

## 2013-01-16 DIAGNOSIS — W010XXA Fall on same level from slipping, tripping and stumbling without subsequent striking against object, initial encounter: Secondary | ICD-10-CM | POA: Insufficient documentation

## 2013-01-16 MED ORDER — DIAZEPAM 5 MG PO TABS
10.0000 mg | ORAL_TABLET | Freq: Once | ORAL | Status: AC
  Administered 2013-01-16: 10 mg via ORAL
  Filled 2013-01-16: qty 2

## 2013-01-16 MED ORDER — OXYCODONE-ACETAMINOPHEN 5-325 MG PO TABS
2.0000 | ORAL_TABLET | Freq: Once | ORAL | Status: AC
  Administered 2013-01-16: 2 via ORAL
  Filled 2013-01-16: qty 2

## 2013-01-16 NOTE — ED Notes (Signed)
Per [facility]- Pt fell "split " slipping on vinyl flooring.  Pt reports having chronic back pain. NO LOC. C/o of back pain and left leg pain Good CMS

## 2013-01-16 NOTE — ED Notes (Signed)
MD at bedside. 

## 2013-01-16 NOTE — ED Notes (Signed)
Bed: WTR7 Expected date:  Expected time:  Means of arrival:  Comments: EMS, fall, leg and lower back pain
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
## 2013-01-23 NOTE — ED Provider Notes (Signed)
Medical screening examination/treatment/procedure(s) were performed by non-physician practitioner and as supervising physician I was immediately available for consultation/collaboration.  Donnetta Hutching, MD 01/23/13 [phone]

## 2013-01-24 ENCOUNTER — Ambulatory Visit: Payer: Self-pay | Admitting: Neurology

## 2013-01-29 ENCOUNTER — Ambulatory Visit: Payer: Medicare Other | Attending: Anesthesiology | Admitting: Physical Therapy

## 2013-01-29 DIAGNOSIS — M545 Low back pain, unspecified: Secondary | ICD-10-CM | POA: Insufficient documentation

## 2013-01-29 DIAGNOSIS — IMO0001 Reserved for inherently not codable concepts without codable children: Secondary | ICD-10-CM | POA: Insufficient documentation

## 2013-01-29 DIAGNOSIS — R262 Difficulty in walking, not elsewhere classified: Secondary | ICD-10-CM | POA: Insufficient documentation

## 2013-01-29 DIAGNOSIS — R5381 Other malaise: Secondary | ICD-10-CM | POA: Insufficient documentation

## 2013-02-05 ENCOUNTER — Ambulatory Visit: Payer: Medicare Other | Admitting: Rehabilitation

## 2013-03-18 ENCOUNTER — Encounter (HOSPITAL_COMMUNITY): Payer: Self-pay | Admitting: Emergency Medicine

## 2013-03-18 ENCOUNTER — Emergency Department (HOSPITAL_COMMUNITY): Payer: Medicare Other

## 2013-03-18 ENCOUNTER — Emergency Department (HOSPITAL_COMMUNITY)
Admission: EM | Admit: 2013-03-18 | Discharge: 2013-03-18 | Disposition: A | Discharge status: Home / Self Care | Payer: Medicare Other | Attending: Emergency Medicine | Admitting: Emergency Medicine

## 2013-03-18 DIAGNOSIS — F319 Bipolar disorder, unspecified: Secondary | ICD-10-CM | POA: Insufficient documentation

## 2013-03-18 DIAGNOSIS — R079 Chest pain, unspecified: Secondary | ICD-10-CM | POA: Insufficient documentation

## 2013-03-18 DIAGNOSIS — I10 Essential (primary) hypertension: Secondary | ICD-10-CM | POA: Insufficient documentation

## 2013-03-18 DIAGNOSIS — Z79899 Other long term (current) drug therapy: Secondary | ICD-10-CM | POA: Insufficient documentation

## 2013-03-18 DIAGNOSIS — G43909 Migraine, unspecified, not intractable, without status migrainosus: Secondary | ICD-10-CM | POA: Insufficient documentation

## 2013-03-18 DIAGNOSIS — Z8739 Personal history of other diseases of the musculoskeletal system and connective tissue: Secondary | ICD-10-CM | POA: Insufficient documentation

## 2013-03-18 DIAGNOSIS — R0602 Shortness of breath: Secondary | ICD-10-CM | POA: Insufficient documentation

## 2013-03-18 DIAGNOSIS — G8929 Other chronic pain: Secondary | ICD-10-CM | POA: Insufficient documentation

## 2013-03-18 DIAGNOSIS — F17200 Nicotine dependence, unspecified, uncomplicated: Secondary | ICD-10-CM | POA: Insufficient documentation

## 2013-03-18 LAB — CBC
HCT: 38.1 % (ref 36.0–46.0)
Platelets: 301 10*3/uL (ref 150–400)
RDW: 15 % (ref 11.5–15.5)
WBC: 7.8 10*3/uL (ref 4.0–10.5)

## 2013-03-18 LAB — COMPREHENSIVE METABOLIC PANEL
Alkaline Phosphatase: 94 U/L (ref 39–117)
BUN: 7 mg/dL (ref 6–23)
GFR calc Af Amer: >90 mL/min (ref >90)
Glucose, Bld: 104 mg/dL — ABNORMAL HIGH (ref 70–99)
Potassium: 4.1 mEq/L (ref 3.5–5.1)
Total Protein: 6.4 g/dL (ref 6.0–8.3)

## 2013-03-18 LAB — POCT I-STAT TROPONIN I: Troponin i, poc: 0.08 ng/mL (ref 0.00–0.08)

## 2013-03-18 LAB — D-DIMER, QUANTITATIVE: D-Dimer, Quant: <0.27 ug/mL-FEU (ref 0.00–0.48)

## 2013-03-18 MED ORDER — DIAZEPAM 5 MG/ML IJ SOLN
5.0000 mg | Freq: Once | INTRAMUSCULAR | Status: AC
  Administered 2013-03-18: 5 mg via INTRAVENOUS
  Filled 2013-03-18 (×2): qty 2

## 2013-03-18 MED ORDER — GI COCKTAIL ~~LOC~~
30.0000 mL | Freq: Once | ORAL | Status: DC
  Filled 2013-03-18: qty 30

## 2013-03-18 MED ORDER — OXYCODONE-ACETAMINOPHEN 5-325 MG PO TABS
1.0000 | ORAL_TABLET | Freq: Once | ORAL | Status: AC
  Administered 2013-03-18: 1 via ORAL
  Filled 2013-03-18: qty 1

## 2013-03-18 NOTE — ED Provider Notes (Signed)
Patient has chronic pain has not seen a chronic pain doctor for about a month. She presents with acute onset of chest pain and back pain about 4:00 this afternoon. Patient has been evaluated in the ER several months ago for chest pain. Patient is a smoker.  Patient indicates she has diffuse pain of her anterior chest and in her left side of her back her back is tender to palpation.  Patient has been very hostile to the staff. I've advised the patient that in emergency department or Wo is to route an acute life-threatening problem such as a heart attack pneumonia. However we are not obligated to continue her on chronic pain medication and it is our right to only give her nonnarcotic pain medications. However patient has had Percocet while in the ED.  Medical screening examination/treatment/procedure(s) were conducted as a shared visit with non-physician practitioner(s) and myself.  I personally evaluated the patient during the encounter.  EKG Interpretation     Ventricular Rate:  92 PR Interval:  152 QRS Duration: 83 QT Interval:  336 QTC Calculation: 416 R Axis:   69 Text Interpretation:  Sinus rhythm Normal ECG No significant change since last tracing             Devoria Albe, MD, Franz Dell, MD 03/18/13 2005

## 2013-03-18 NOTE — ED Notes (Addendum)
Pt reports to the ED for eval of CP that began at approx 1600 today. Pt describes the pain as pressure that radiates into her back and left shoulder. Pt denies any aggravating or relieving factors. Pt has had 324 of ASA and 2 nitros PTA with no change in the pain. Pt hypertensive and tachycardic 100-120s en route. 12 lead showed sinus tach. Pt denies any N/V, SOB, lightheadedness, or dizziness. Pt was noted to be diaphoretic upon EMS arrival. Pt writhing in pain at this time. Skin warm and dry at this time. Pt A&O x4.

## 2013-03-18 NOTE — ED Provider Notes (Signed)
See prior note   Ward Givens, MD 03/18/13 2027

## 2013-03-18 NOTE — ED Notes (Signed)
Dr. Lynelle Doctor speaking to patient.

## 2013-03-18 NOTE — ED Provider Notes (Signed)
CSN: [id number]     Arrival date & time 03/18/13  1711 History   First MD Initiated Contact with Patient 03/18/13 1713     Chief Complaint  Patient presents with  . Chest Pain   (Consider location/radiation/quality/duration/timing/severity/associated sxs/prior Treatment) HPI KAMARIYAH TIMBERLAKE is a 37 y.o. female who presents to emergency department complaining of chest pain. Patient states her chest pain began at 4:00 today. States she had a sudden onset of pain and states it's radiating to her left back and left shoulder. Patient states nothing making her symptoms better or worse. States pain has been constant and worsening since it started. States that she got  325 L of aspirin by EMS and 2 nitroglycerin which did not improve her pain. Patient states that she was told that she may have coronary disease in the past however states that she's never had a stress test or cardiac cath. Patient is a poor historian and is rolling in pain and unable to provide any more history.  Past Medical History  Diagnosis Date  . Hypertension   . Chronic back pain   . Migraine headache   . Stress incontinence   . Sciatica   . Bipolar disorder    Past Surgical History  Procedure Laterality Date  . Fracture surgery    . Back surgery    . Cesarean section    . Gastric bypass    . Ankle surgery    . Lumbar disc surgery    . Lumbar fusion     Family History  Problem Relation Age of Onset  . Diabetes Mother   . Hypertension Mother   . Diabetes Father   . Hypertension Father    History  Substance Use Topics  . Smoking status: Current Every Day Smoker -- 1.00 packs/day    Types: Cigarettes  . Smokeless tobacco: Not on file  . Alcohol Use: No   OB History   Grav Para Term Preterm Abortions TAB SAB Ect Mult Living                 Review of Systems  Constitutional: Negative for fever and chills.  Respiratory: Positive for chest tightness and shortness of breath. Negative for cough and wheezing.    Cardiovascular: Positive for chest pain. Negative for palpitations and leg swelling.  Gastrointestinal: Negative for nausea, vomiting, abdominal pain and diarrhea.  Genitourinary: Negative for dysuria, flank pain, vaginal bleeding, vaginal discharge, vaginal pain and pelvic pain.  Musculoskeletal: Negative for arthralgias, myalgias, neck pain and neck stiffness.  Skin: Negative for rash.  Neurological: Negative for dizziness, weakness and headaches.  All other systems reviewed and are negative.    Allergies  Ketorolac tromethamine and Methocarbamol  Home Medications   Current Outpatient Rx  Name  Route  Sig  Dispense  Refill  . amitriptyline (ELAVIL) 50 MG tablet   Oral   Take 50 mg by mouth at bedtime.         Marland Kitchen amLODipine (NORVASC) 5 MG tablet   Oral   Take 5 mg by mouth daily.         . baclofen (LIORESAL) 10 MG tablet   Oral   Take 10 mg by mouth 3 (three) times daily.          . cetirizine (ZYRTEC) 10 MG tablet   Oral   Take 10 mg by mouth daily.         . DULoxetine (CYMBALTA) 60 MG capsule   Oral   Take  60 mg by mouth daily.         Marland Kitchen gabapentin (NEURONTIN) 300 MG capsule   Oral   Take 300 mg by mouth 3 (three) times daily.         Marland Kitchen omeprazole (PRILOSEC) 20 MG capsule   Oral   Take 20 mg by mouth daily.          BP 136/98  Pulse 96  Temp(Src) 99.1 F (37.3 C) (Oral)  Resp 11  SpO2 98%  LMP 02/25/2013 Physical Exam  Nursing note and vitals reviewed. Constitutional: She is oriented to person, place, and time. She appears well-developed and well-nourished.  A should roll in and around in pain and crying  HENT:  Head: Normocephalic.  Eyes: Conjunctivae are normal.  Neck: Neck supple.  Cardiovascular: Normal rate, regular rhythm and normal heart sounds.   Pulmonary/Chest: Effort normal and breath sounds normal. No respiratory distress. She has no wheezes. She has no rales. She exhibits no tenderness.  Abdominal: Soft. Bowel sounds are  normal. She exhibits no distension. There is no tenderness. There is no rebound.  Musculoskeletal: She exhibits no edema.  Neurological: She is alert and oriented to person, place, and time.  Skin: Skin is warm and dry.  Psychiatric: She has a normal mood and affect. Her behavior is normal.    ED Course  Procedures (including critical care time) Labs Review Labs Reviewed  COMPREHENSIVE METABOLIC PANEL - Abnormal; Notable for the following:    Glucose, Bld 104 (*)    Creatinine, Ser 0.49 (*)    Albumin 3.2 (*)    Total Bilirubin 0.2 (*)    All other components within normal limits  CBC - Abnormal; Notable for the following:    MCHC 36.5 (*)    All other components within normal limits  D-DIMER, QUANTITATIVE  POCT I-STAT TROPONIN I   Imaging Review Dg Chest 2 View  03/18/2013   CLINICAL DATA:  Chest pain  EXAM: CHEST  2 VIEW  COMPARISON:  03/27/2004  FINDINGS: The heart size and mediastinal contours are within normal limits. No acute infiltrate or pulmonary edema. Bony thorax is stable.  IMPRESSION: No active cardiopulmonary disease.   Electronically Signed   By: Natasha Mead M.D.   On: 03/18/2013 18:17    EKG Interpretation     Ventricular Rate:  92 PR Interval:  152 QRS Duration: 83 QT Interval:  336 QTC Calculation: 416 R Axis:   69 Text Interpretation:  Sinus rhythm Normal ECG No significant change since last tracing            MDM   1. Chest pain     Patient with atypical chest pain since 4:00. He has been constant since then. It is sharp. It radiates into the left shoulder blade. I can reproduce the pain by palpation of her back. 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 I did gt a d dimer, negative for PE. Did get 2 trop, both negative. Pt very aggressive, yelling out and cursing in ED. I did give her percocet for pain, which did not touch her pain. Pt is a chronic pain patient, in pain management  but stated she does not want to go to them any more because states "that doctor is stupid."  She was upset with me bc i did not give her any more pain medications. She requested to speak with my attending. Dr. Lynelle Doctor went and saw  pt, agrees with evaluation. At this time, no signs of life threatening disease. Will d/c home with NSAIDs and follow up with pcp or pain doctor for pain management.   Filed Vitals:   03/18/13 1845 03/18/13 1920 03/18/13 1930 03/18/13 2000  BP: 136/98 156/99 141/103 131/103  Pulse: 96 90 86 101  Temp:      TempSrc:      Resp: 11 14 12 19   SpO2: 98% 100% 99% 98%       Myriam Jacobson Bronson Bressman, PA-C 03/18/13 2023

## 2013-03-18 NOTE — ED Notes (Signed)
PA requesting RN to call security- sts pt was becoming increasingly verbally aggressive. RN entered room with security at door. Pt refusing medications. RN explained reason for individual medication and patient refusing stating "I dont want stomach medicine and valium I want pain medication and to talk to the doctor not the PA". Dr. Lynelle Doctor made aware.

## 2013-05-30 IMAGING — CT CT ANGIO CHEST
2 of 6 series · 18 of 46 positions shown · IV contrast (omnipaque)
Comparison: No priors.

CLINICAL DATA: Chest pain and back pain.

CT ANGIOGRAPHY CHEST
TECHNIQUE: Multidetector CT imaging of the chest using the
standard protocol during bolus administration of intravenous
contrast. Multiplanar reconstructed images including MIPs were
obtained and reviewed to evaluate the vascular anatomy.
Contrast: 100mL OMNIPAQUE IOHEXOL 350 MG/ML SOLN

[Series 5: dissection 2.0 st · axial · 0.82mm/px · z∈[-18,+284]mm · 15 of 167 slices shown]
[im 8/167  lung]
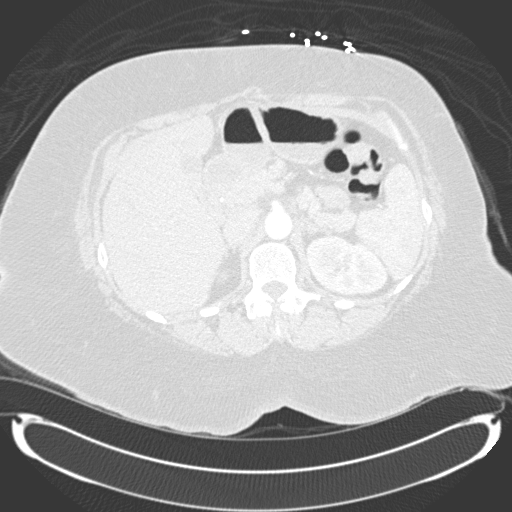
[im 23/167  soft-tissue]
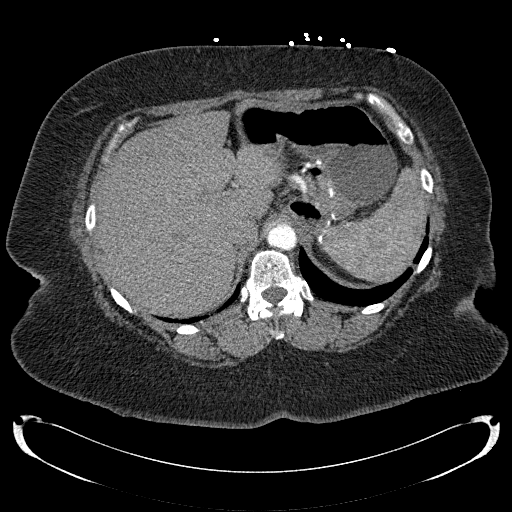
[im 31/167  lung]
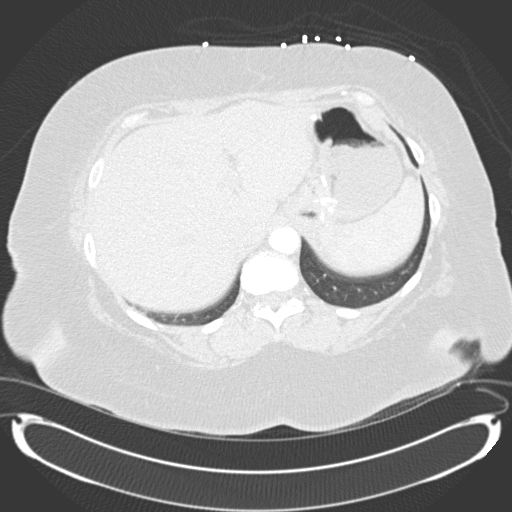
[im 38/167  soft-tissue]
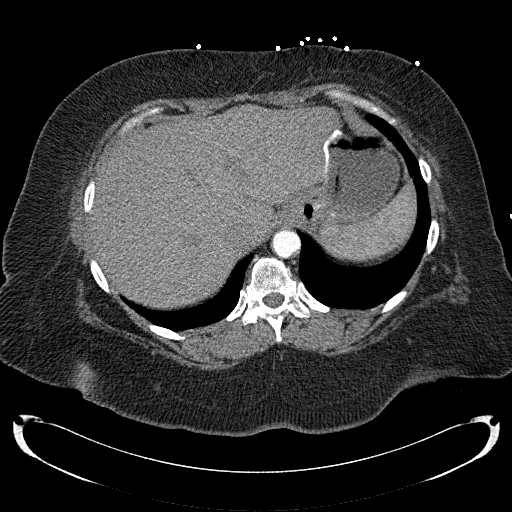
[im 53/167  lung]
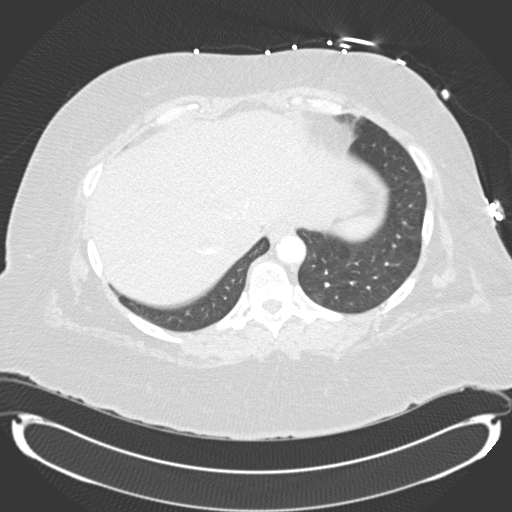
[im 61/167  soft-tissue]
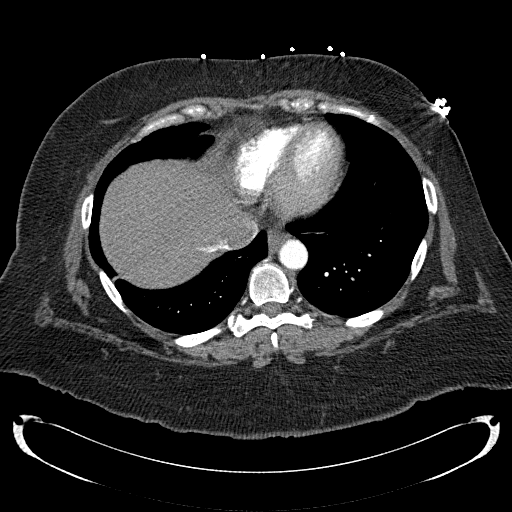
[im 76/167  lung]
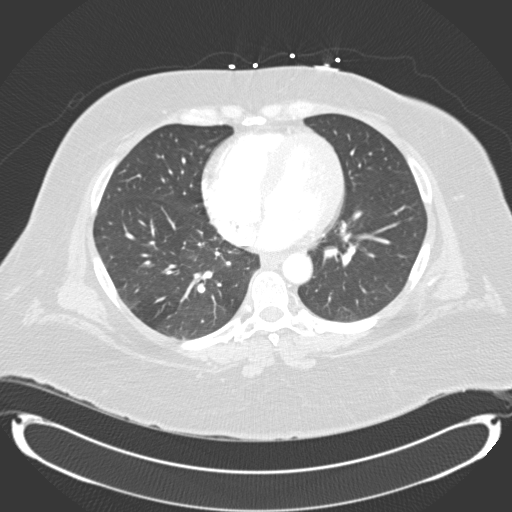
[im 84/167  soft-tissue]
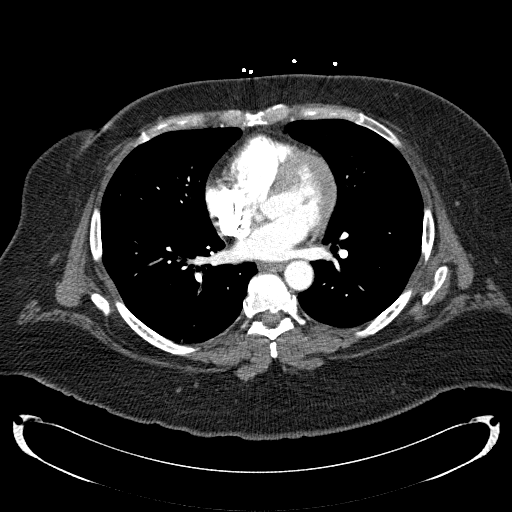
[im 91/167  lung]
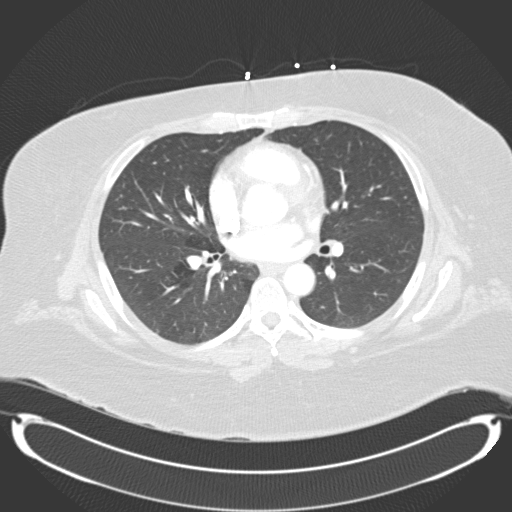
[im 106/167  soft-tissue]
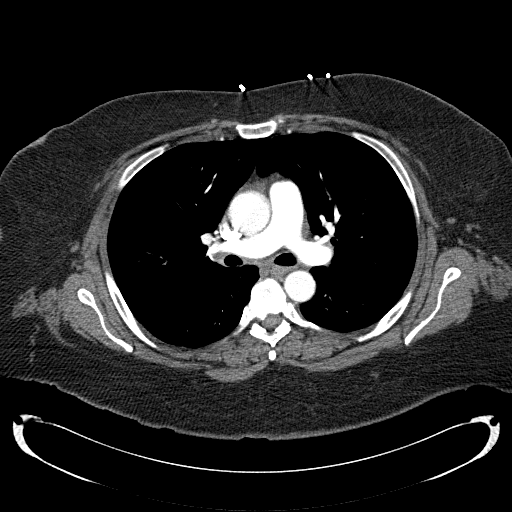
[im 114/167  lung]
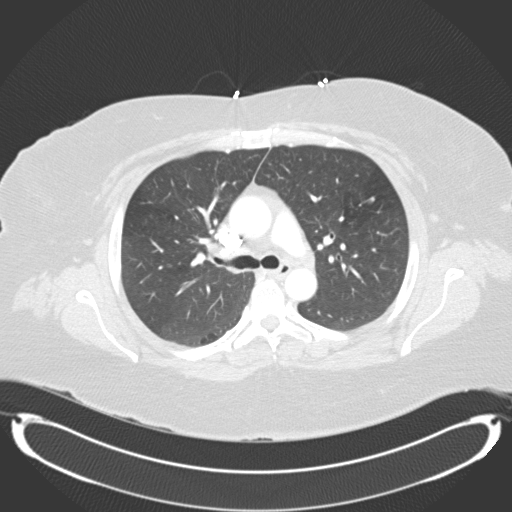
[im 129/167  soft-tissue]
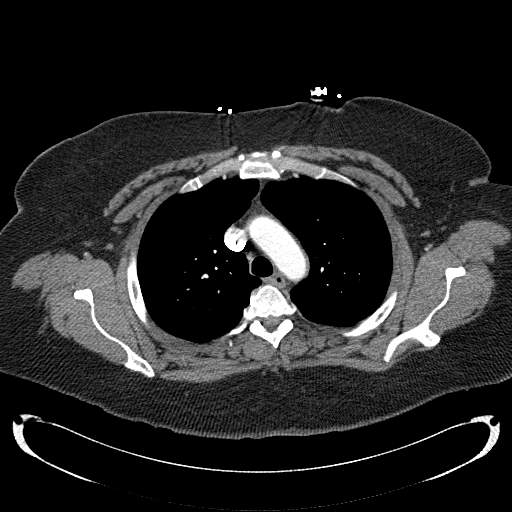
[im 136/167  lung]
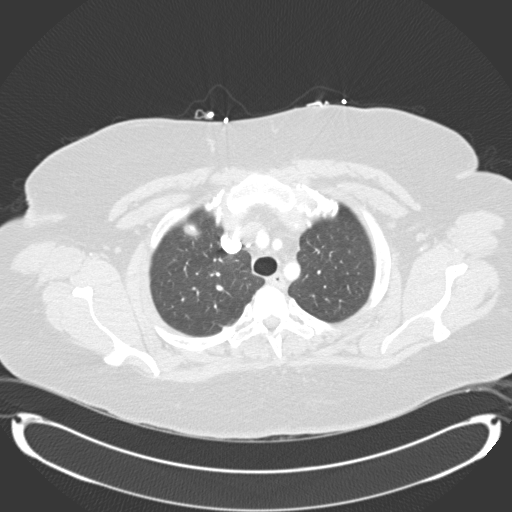
[im 144/167  soft-tissue]
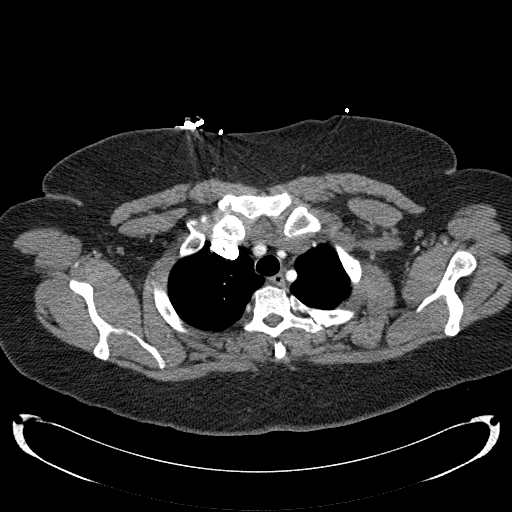
[im 159/167  lung]
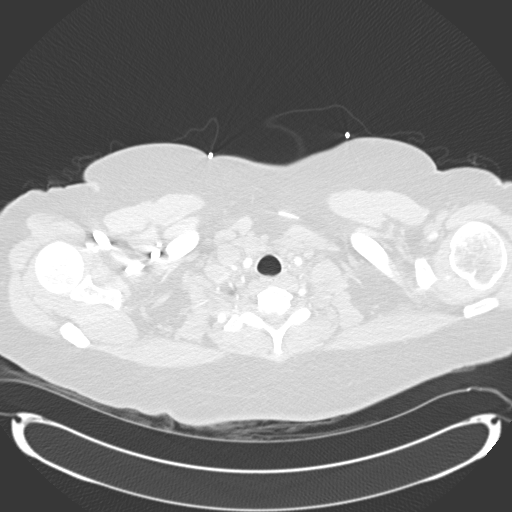

[Series 602: cor · coronal · 0.82mm/px · 3 of 106 slices shown]
[im 27/106  soft-tissue]
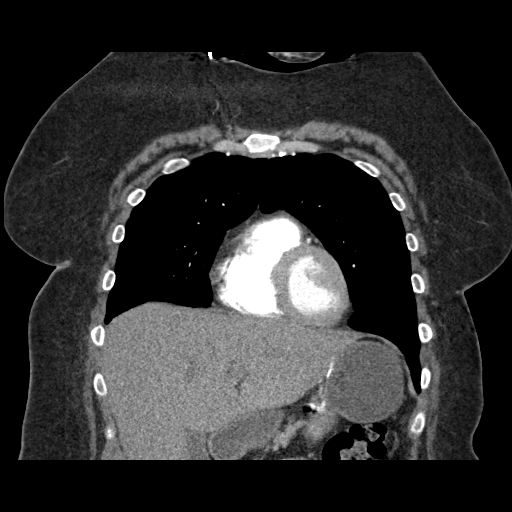
[im 53/106  soft-tissue]
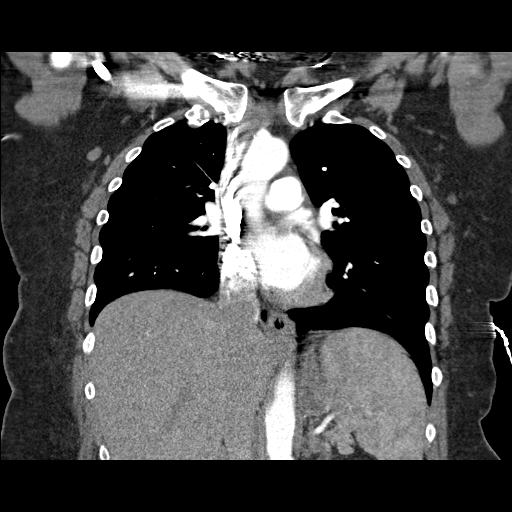
[im 79/106  soft-tissue]
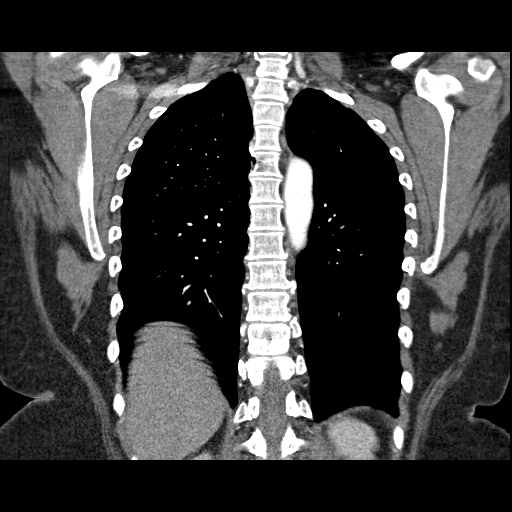

[18 of 46 positions shown; findings below may reference images not displayed]

FINDINGS: Mediastinum: No acute abnormality of the thoracic aorta;
specifically, no aneurysm or dissection. Heart size is normal.
There is no significant pericardial fluid, thickening or
pericardial calcification.  The atherosclerotic calcifications in
the proximal left anterior descending coronary artery.  No
pathologically enlarged mediastinal or hilar lymph nodes. Esophagus
is unremarkable in appearance.    The study was not specifically
tailored for evaluation of the pulmonary arteries, however, no
pulmonary embolism is noted.

Lungs/Pleura: Mild diffuse bronchial wall thickening.  Patchy areas
of mild air trapping are noted in the lungs bilaterally, suggesting
small airways disease.  Small clusters of tree in bud
micronodularity are seen scattered throughout the lungs
bilaterally, indicative of mucoid impaction within terminal
bronchioles; the extent of this is very mild.  No acute
consolidative air space disease.  No pleural effusions.  Linear
opacity in the right lower lobe is compatible with subsegmental
atelectasis and/or scarring.

Upper Abdomen: Postoperative changes near the gastroesophageal
junction, suggesting prior gastric bypass surgery.

Musculoskeletal: There are no aggressive appearing lytic or blastic
lesions noted in the visualized portions of the skeleton.
IMPRESSION: 1.  No acute abnormality of the thoracic aorta.  Specifically, no
evidence of aneurysm or dissection.
2.  Mild air trapping the lungs bilaterally indicative of small
airways disease.
3.  Patchy areas with very mild tree in bud micronodularity in the
lungs bilaterally, indicative of mucoid impaction within terminal
bronchioles.
4.  Atherosclerosis, including left anterior descending coronary
artery disease.  This is likely age advanced in this young female
patient.  Please note that although the presence of coronary artery
calcium documents the presence of coronary artery disease, the
severity of this disease and any potential stenosis cannot be
assessed on this non-gated CT examination.  Assessment for
potential risk factor modification, dietary therapy or
pharmacologic therapy may be warranted, if clinically indicated.

## 2013-05-30 IMAGING — CR DG CHEST 2V
2 series · 2 of 2 positions shown · non-contrast
Comparison: 03/27/2004

CLINICAL DATA: Chest and back pain

CHEST - 2 VIEW

[w chest pa]
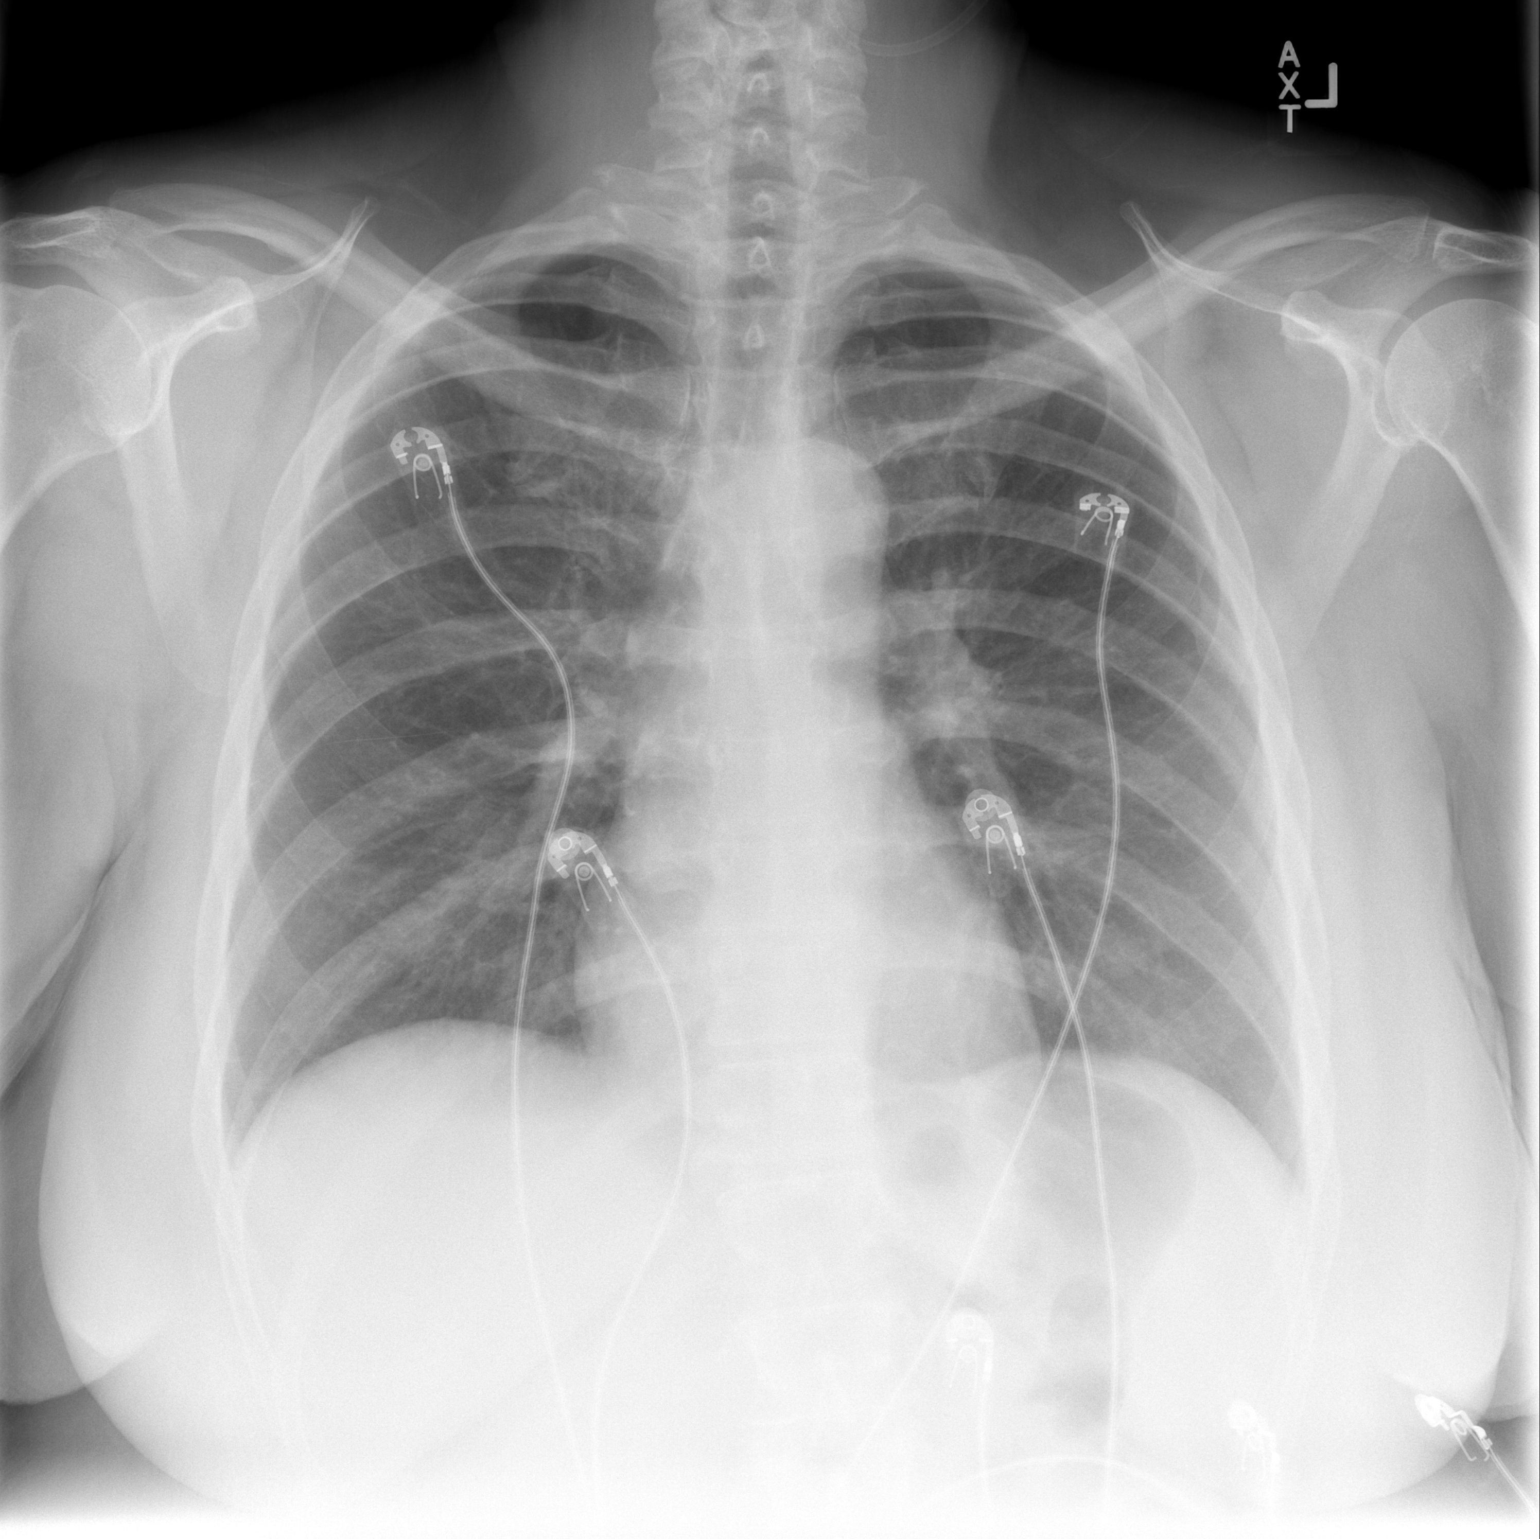

[w chest lat]
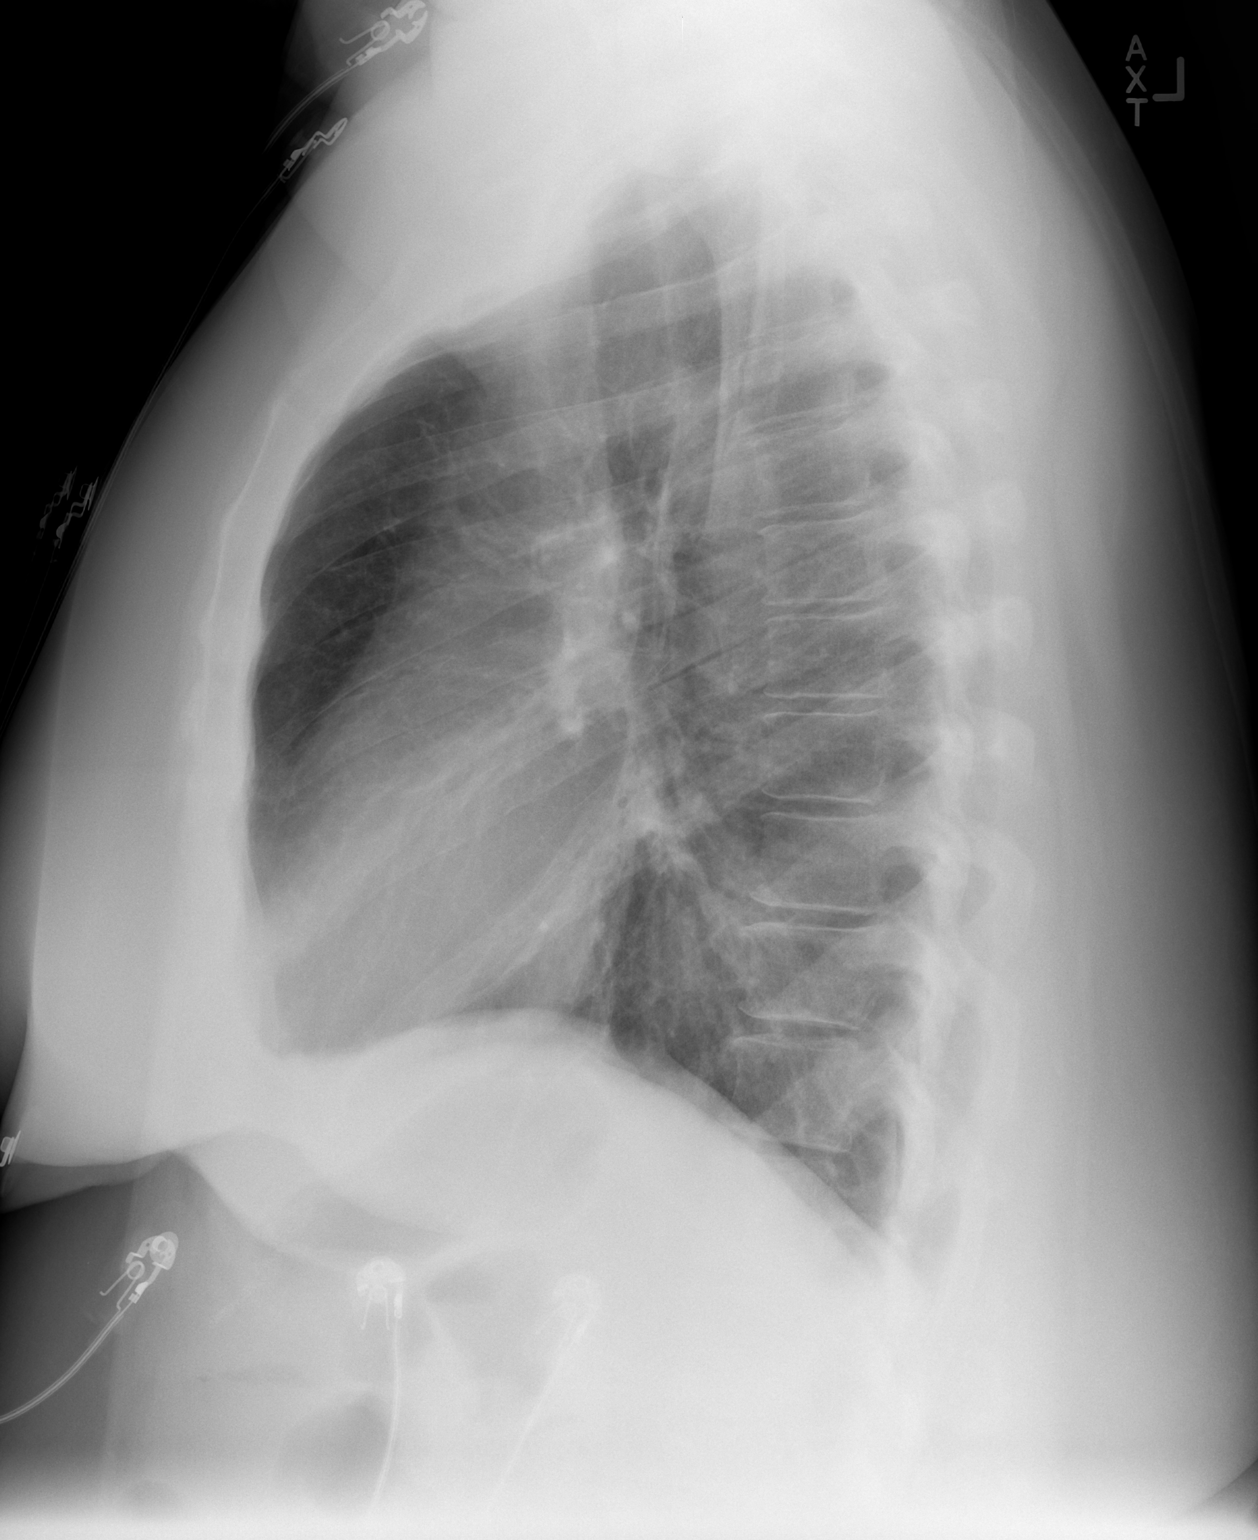

[2 of 2 positions shown; findings below may reference images not displayed]

FINDINGS: The heart size and mediastinal contours are within
normal limits.  Both lungs are clear.  The visualized skeletal
structures are unremarkable.
IMPRESSION: No active cardiopulmonary disease.

## 2013-07-16 IMAGING — CR DG FOOT COMPLETE 3+V*L*
3 series · 3 of 3 positions shown · non-contrast
Comparison: None.

CLINICAL DATA: Fall.  Left foot pain.

LEFT FOOT - COMPLETE 3+ VIEW

[x foot ap left]
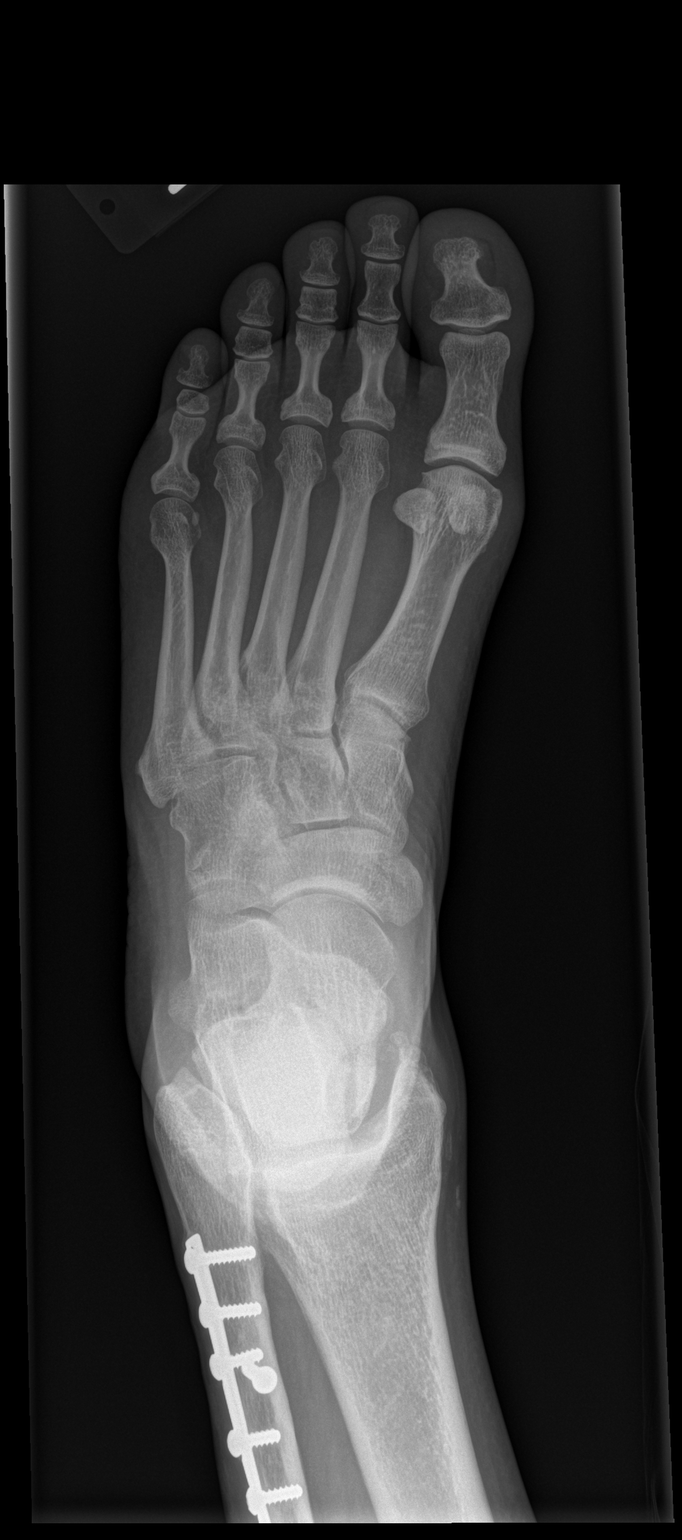

[x foot obl left]
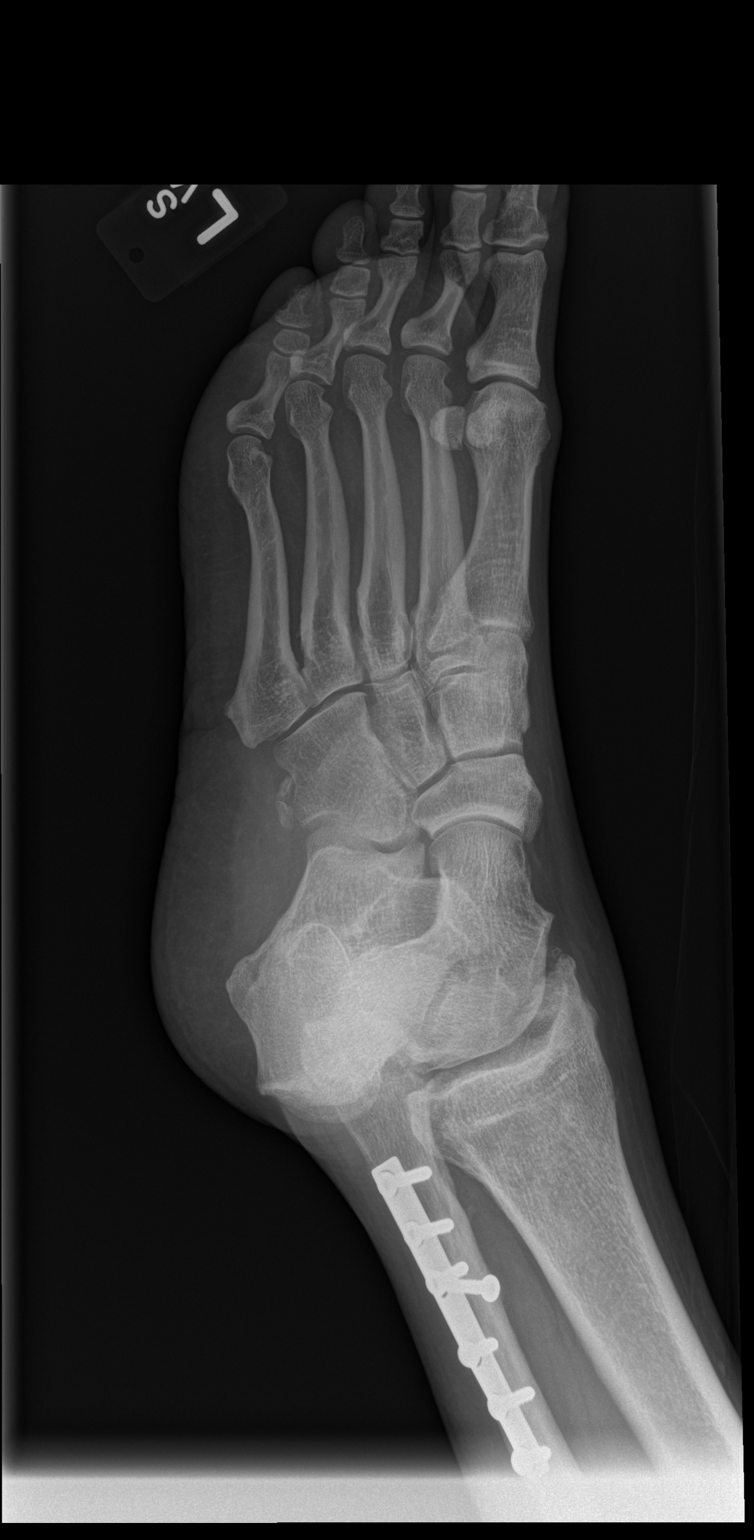

[x foot lat left]
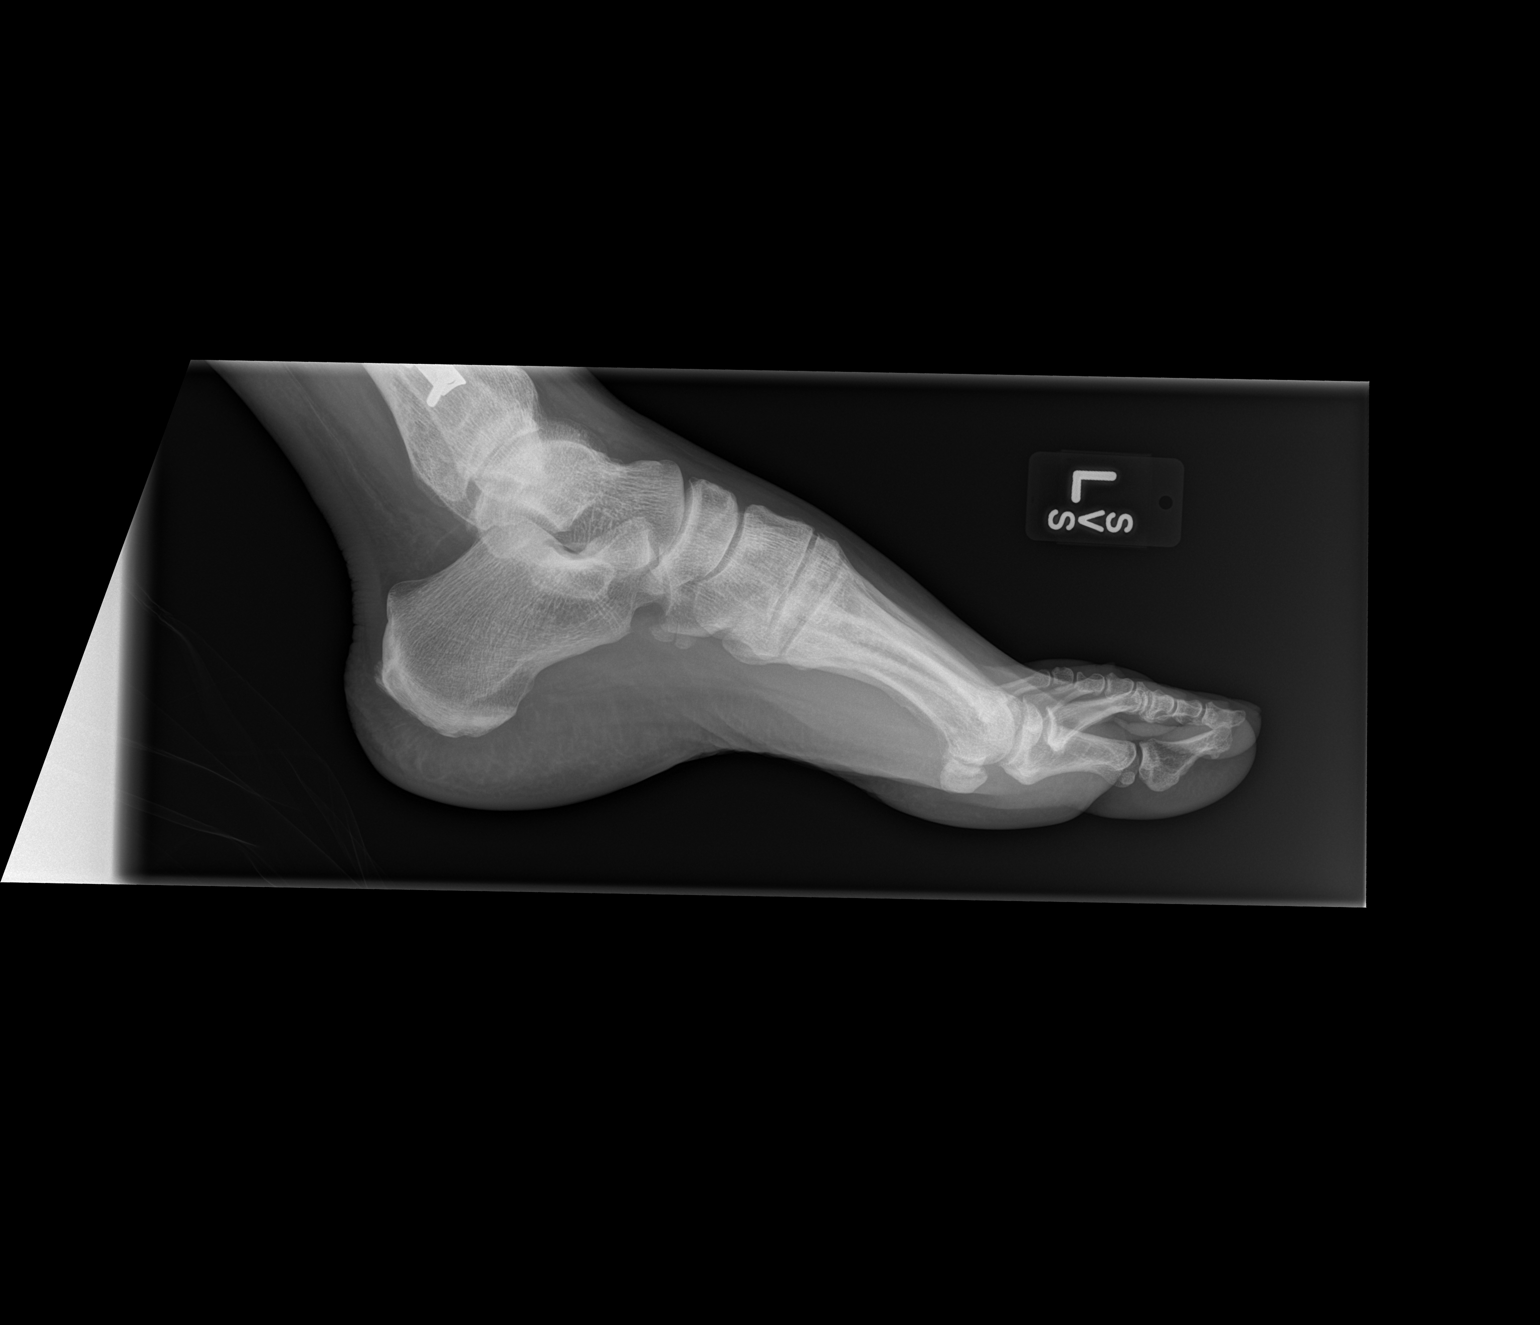

[3 of 3 positions shown; findings below may reference images not displayed]

FINDINGS: No acute bony or joint abnormality is identified.
Hardware fixing an old healed distal fibular fracture is noted.
Soft tissue structures are unremarkable.
IMPRESSION: No acute abnormality.

## 2013-07-16 IMAGING — CR DG ANKLE COMPLETE 3+V*L*
3 series · 3 of 3 positions shown · non-contrast
Comparison: Plain films right ankle 04/11/2005.

CLINICAL DATA: Injury.  Right ankle pain.

LEFT ANKLE COMPLETE - 3+ VIEW

[x ankle lat left]
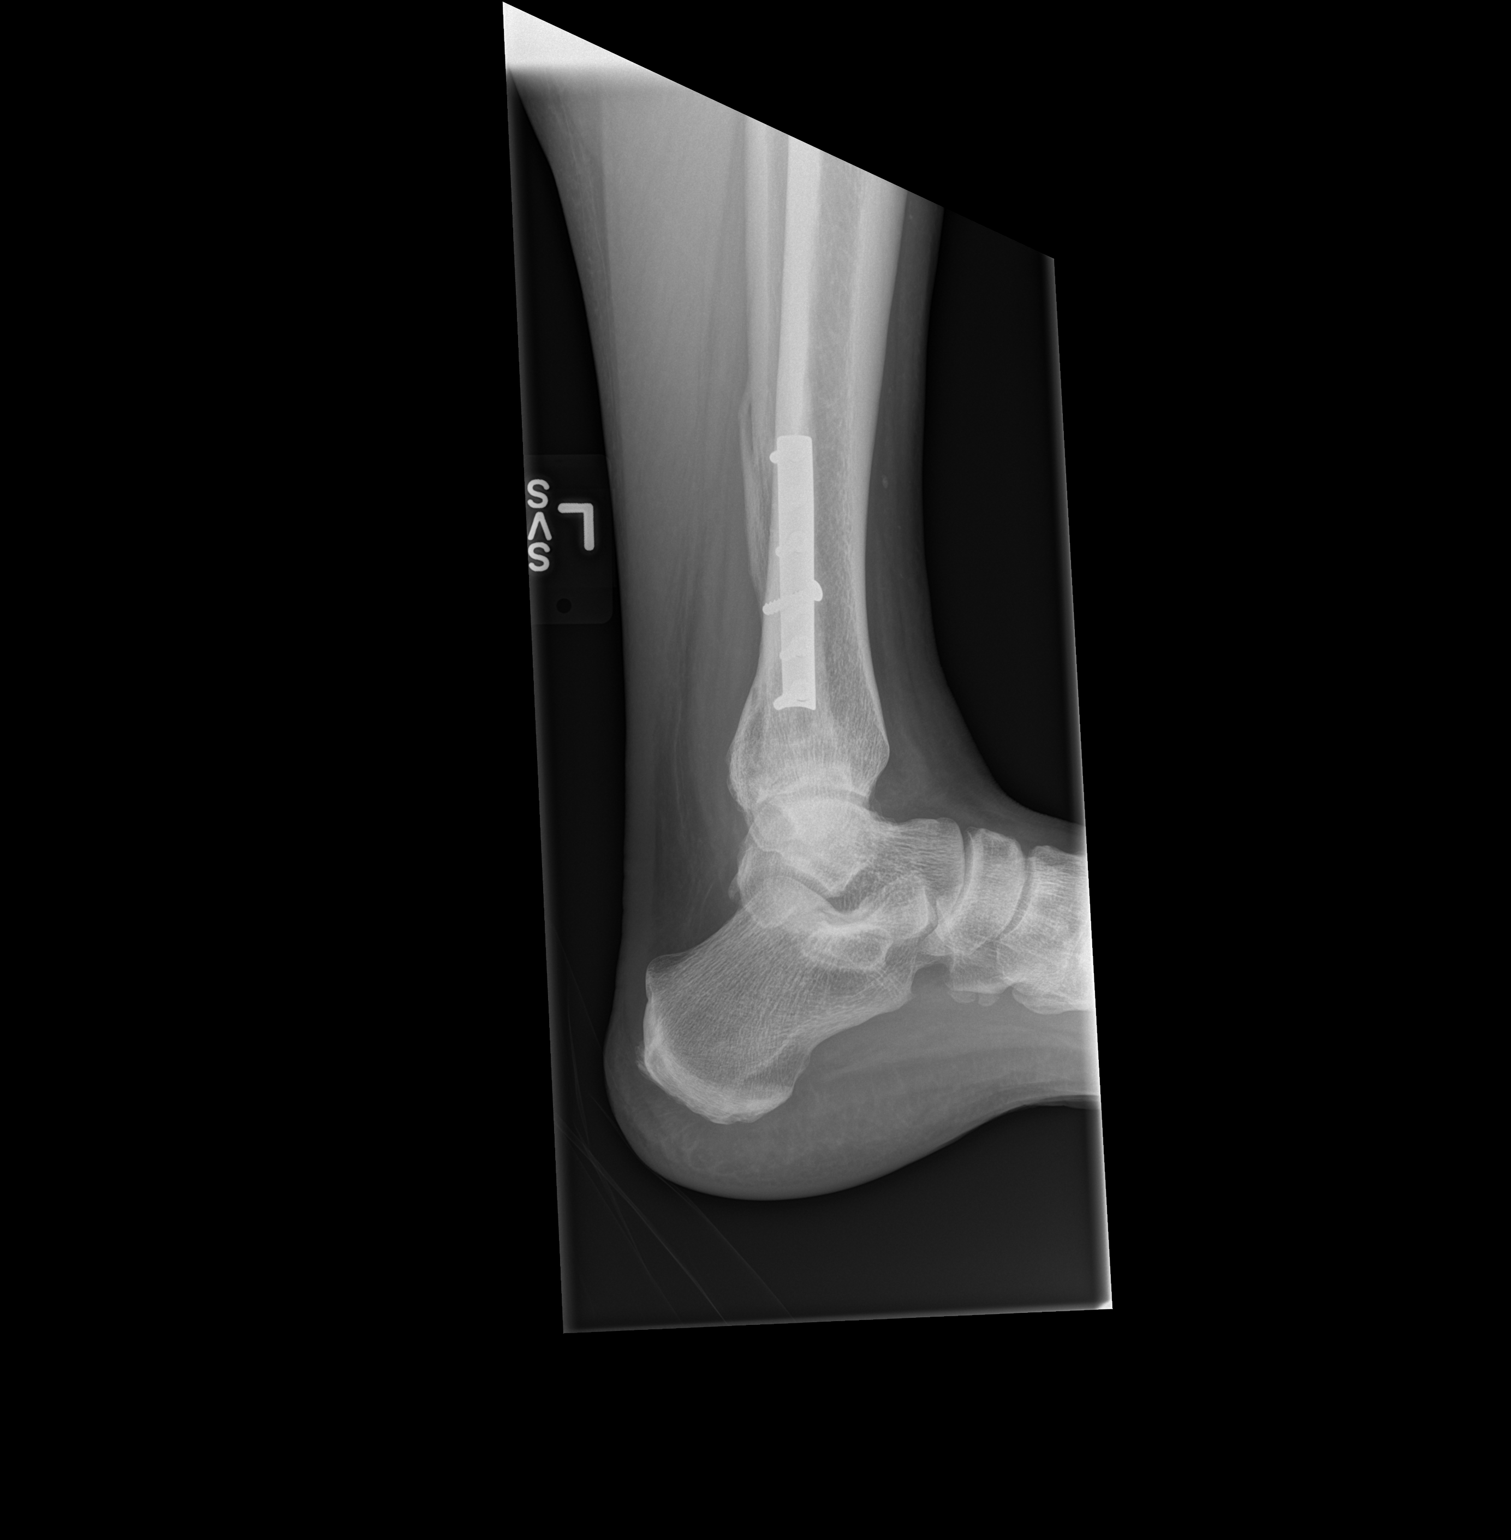

[x ankle ap left]
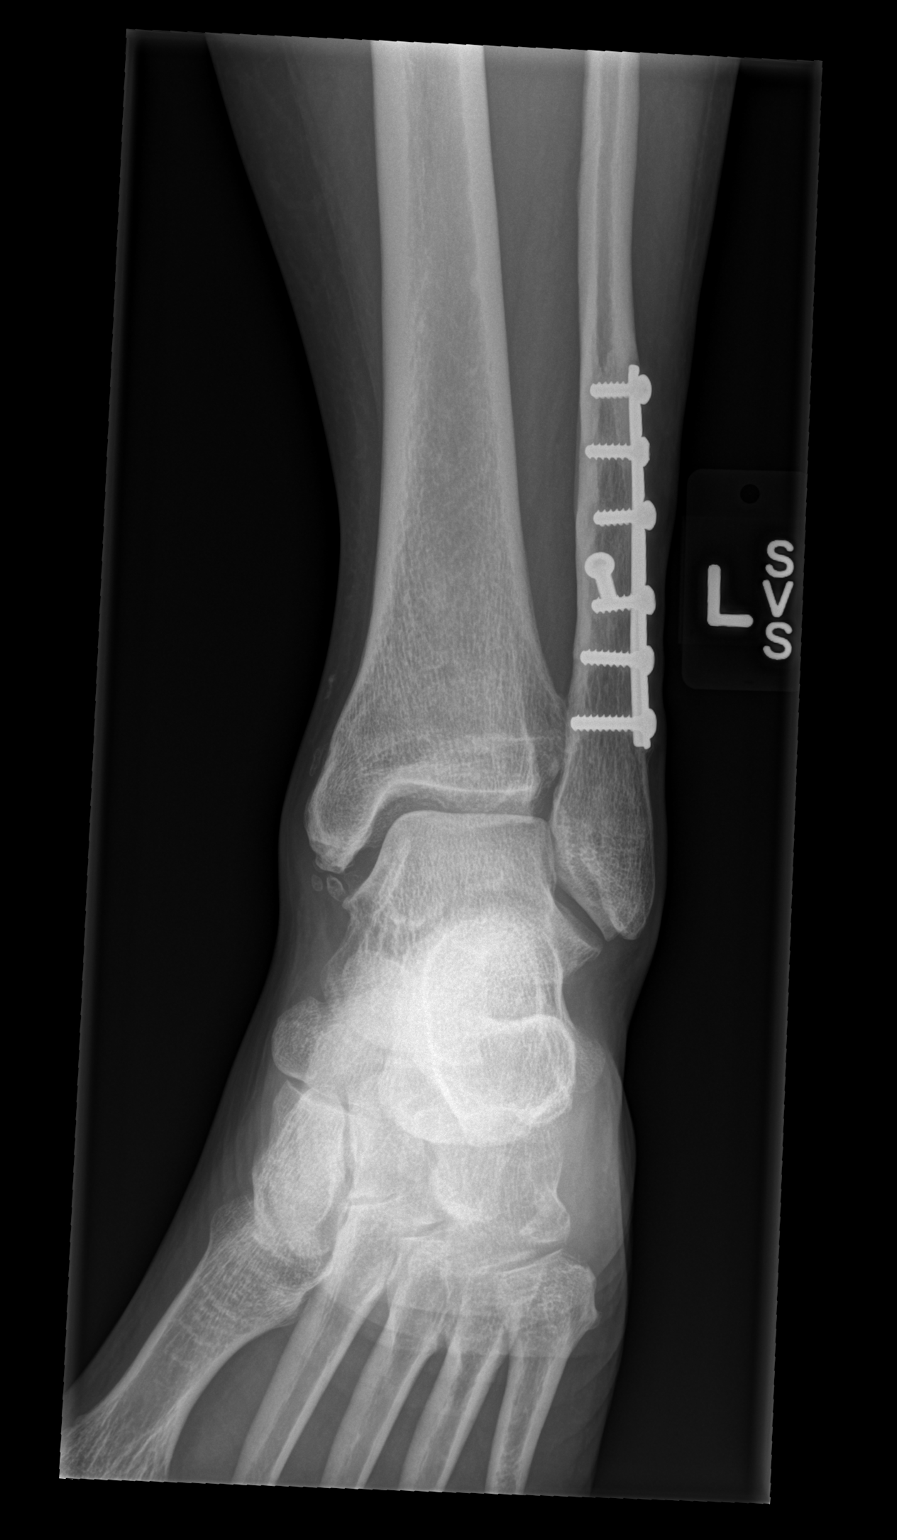

[x ankle obl left]
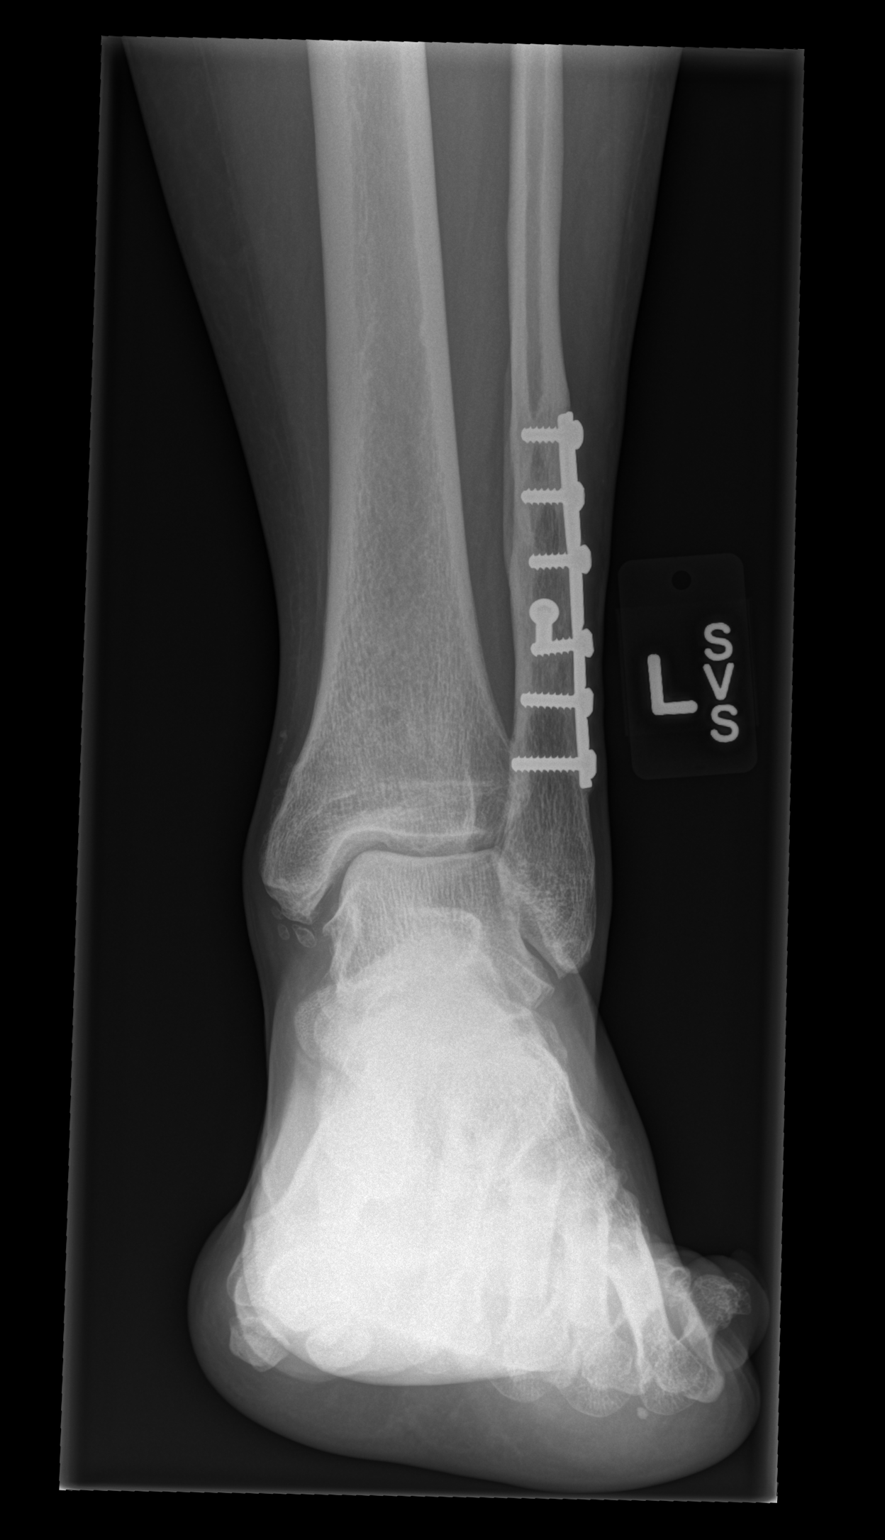

[3 of 3 positions shown; findings below may reference images not displayed]

FINDINGS: There is no acute bony or joint abnormality.  Old healed
distal fibular fracture with hardware in place again seen.  Small
corticated bony fragments off the medial malleolus are likely due
to old trauma.
IMPRESSION: No acute finding.  Stable compared to prior exam.

## 2013-07-16 IMAGING — CR DG KNEE COMPLETE 4+V*L*
5 series · 5 of 5 positions shown · non-contrast
Comparison: None.

CLINICAL DATA: Fall.  Left knee pain.

LEFT KNEE - COMPLETE 4+ VIEW

[x knee ap left (1 of 3)]
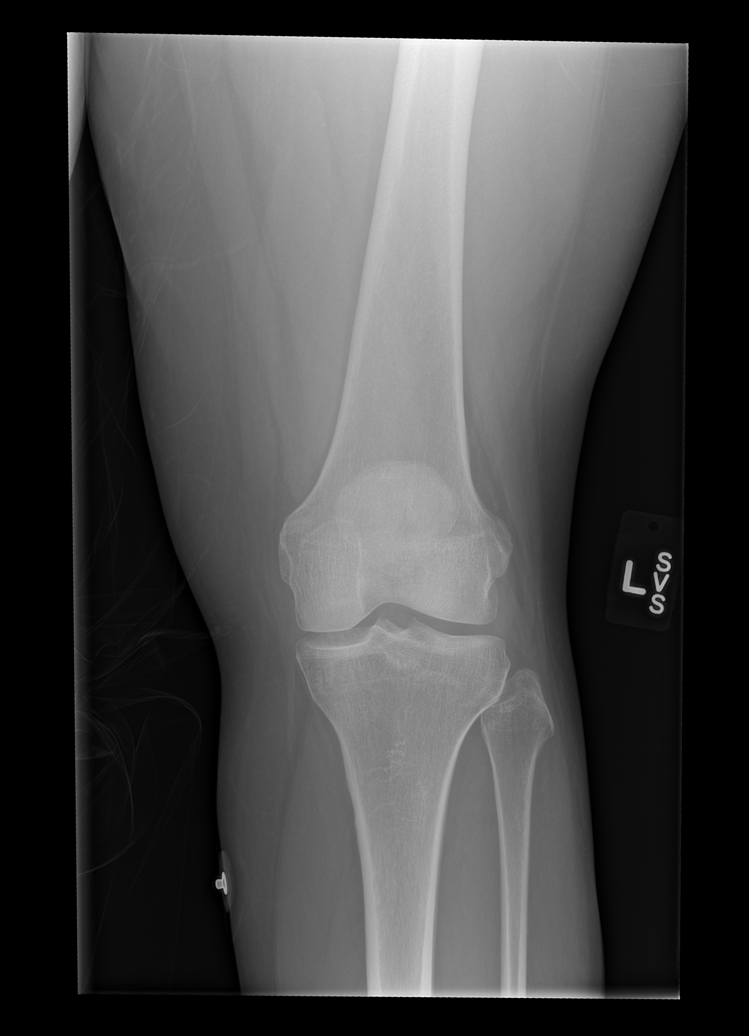

[x knee ap left (2 of 3)]
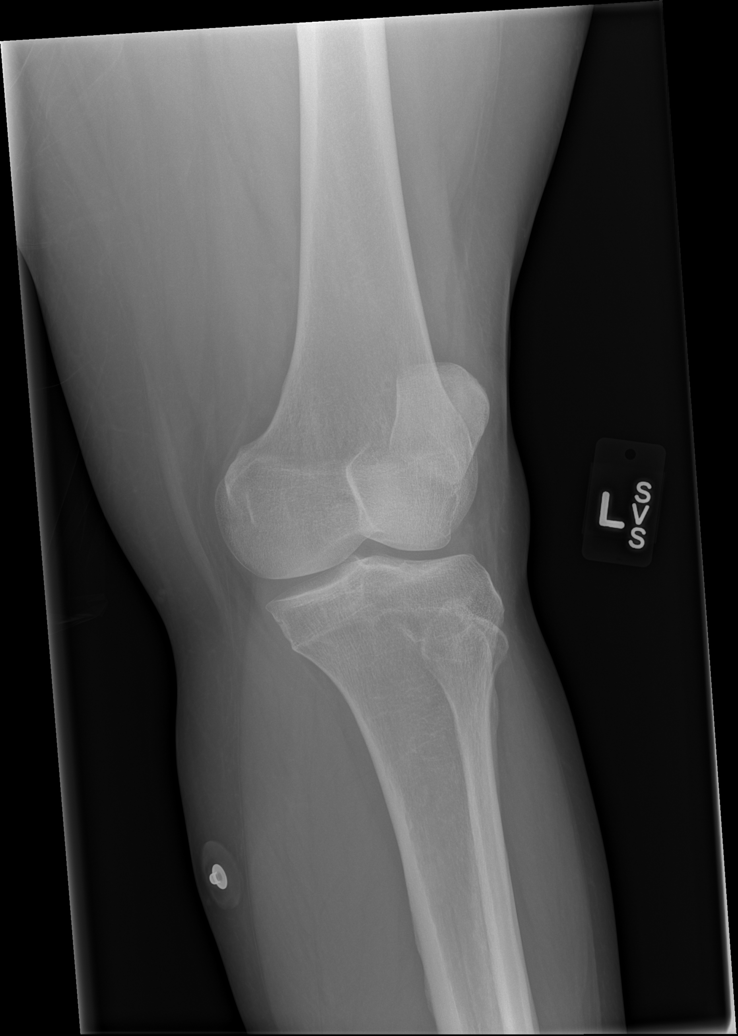

[x knee ap left (3 of 3)]
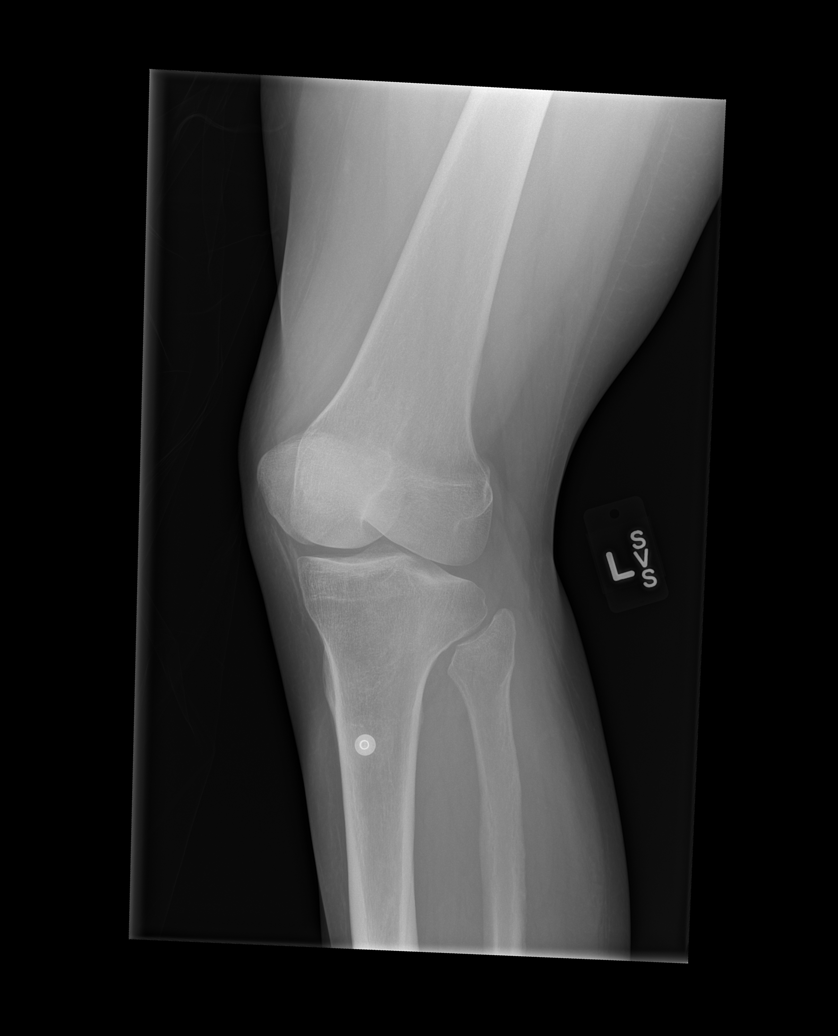

[x knee lat left (1 of 2)]
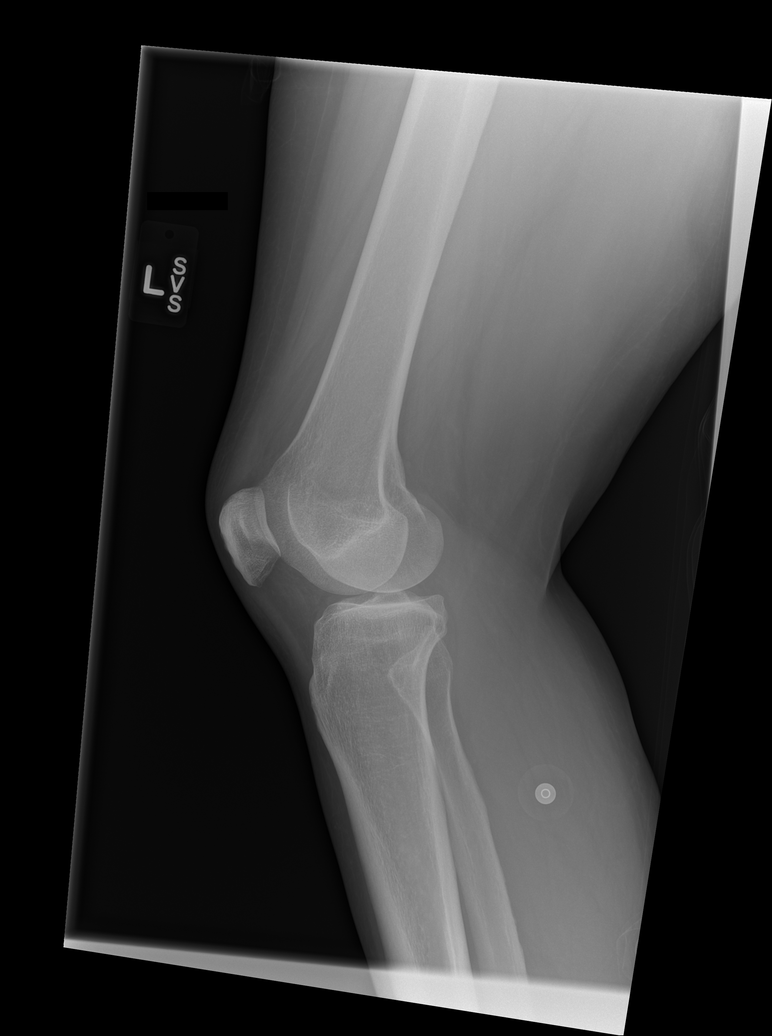

[x knee lat left (2 of 2)]
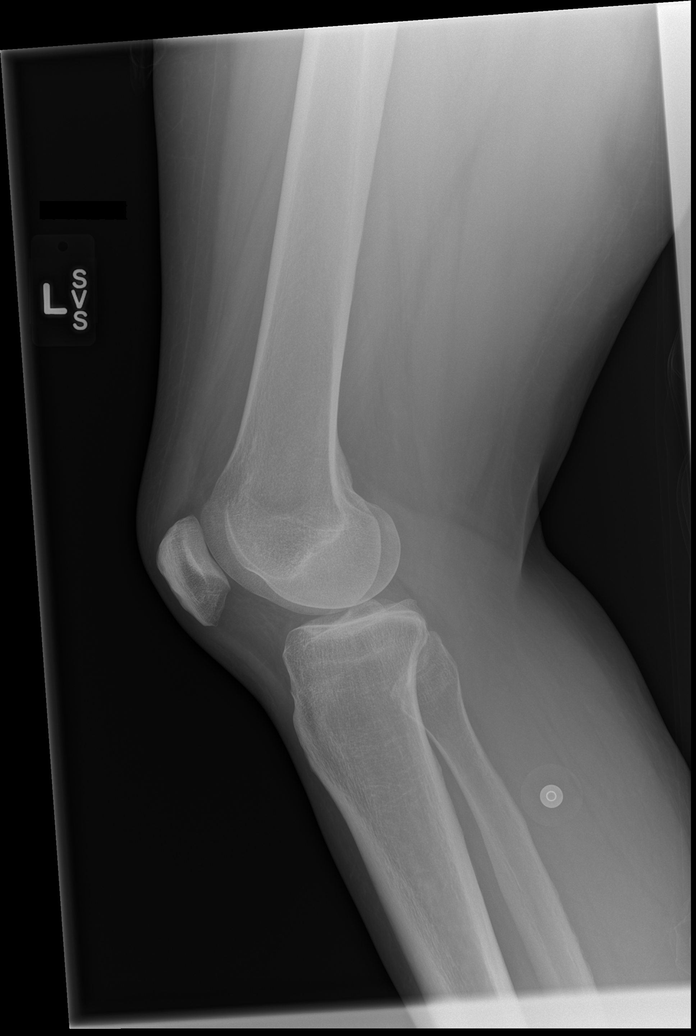

[5 of 5 positions shown; findings below may reference images not displayed]

FINDINGS: Imaged bones, joints and soft tissues appear normal.
IMPRESSION: Negative examination.

## 2013-08-10 IMAGING — CT CT HEAD W/O CM
1 series · 16 of 30 positions shown, 20 images · non-contrast
Comparison: None.

CLINICAL DATA: Right-sided facial droop

CT HEAD WITHOUT CONTRAST
TECHNIQUE: Contiguous axial images were obtained from the base of
the skull through the vertex without contrast.

[Series 2: head 5.0 h30s · axial · 0.44mm/px · z∈[-146,-11]mm · 16 of 31 slices shown, 20 images]
[im 2/31  brain]
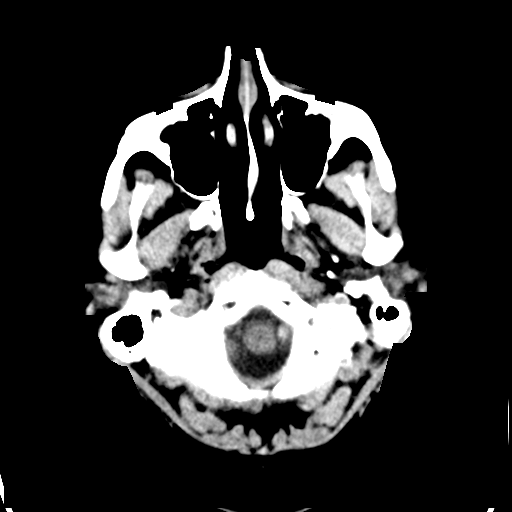
[im 2/31  bone]
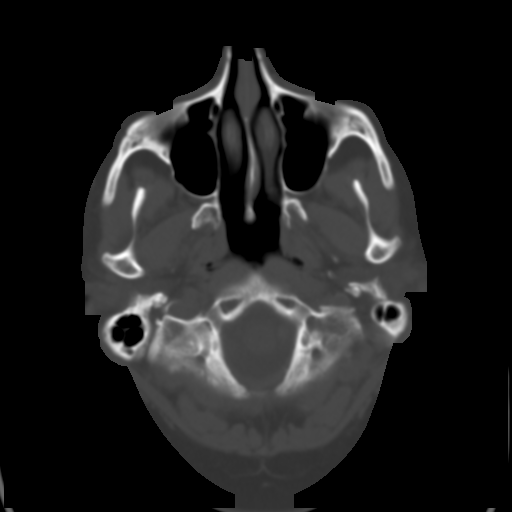
[im 4/31  brain]
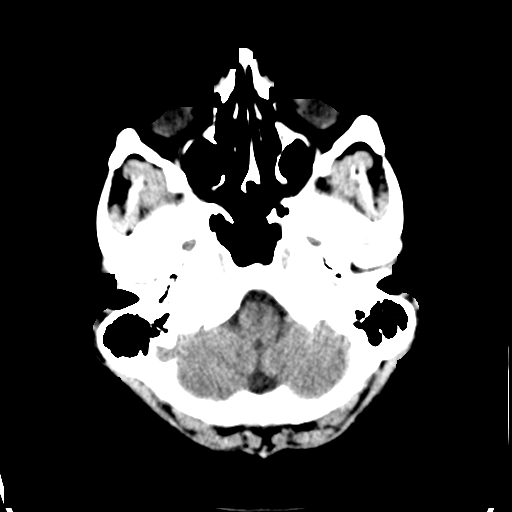
[im 6/31  brain]
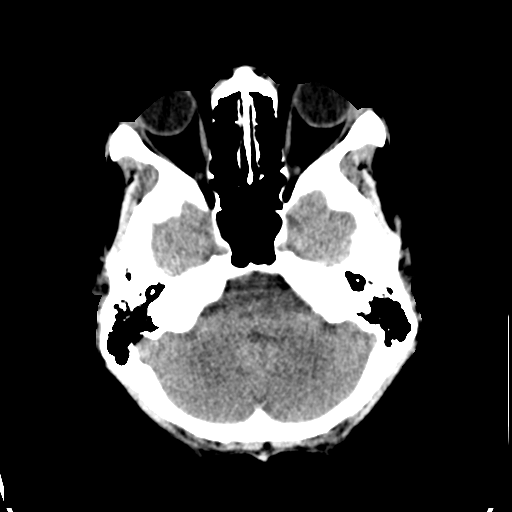
[im 8/31  brain]
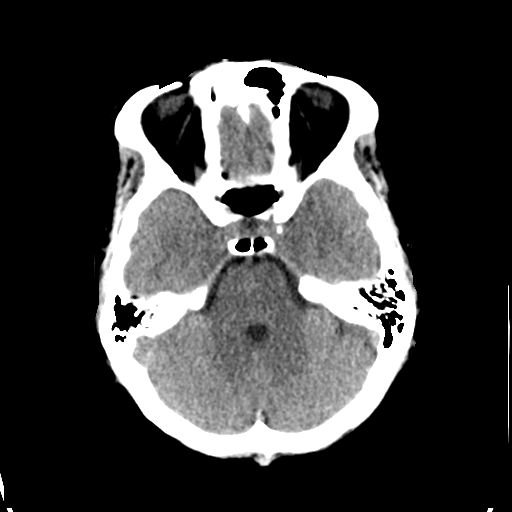
[im 9/31  brain]
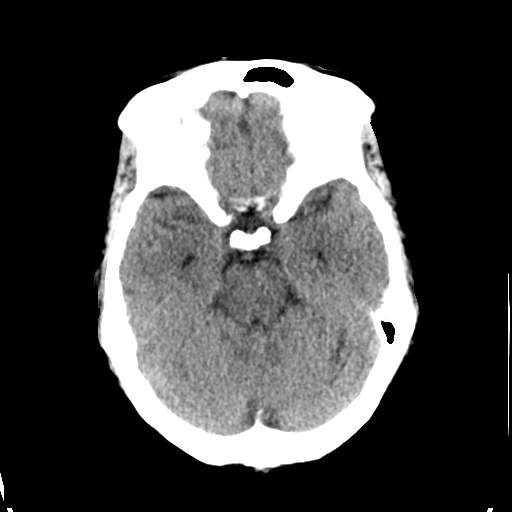
[im 9/31  bone]
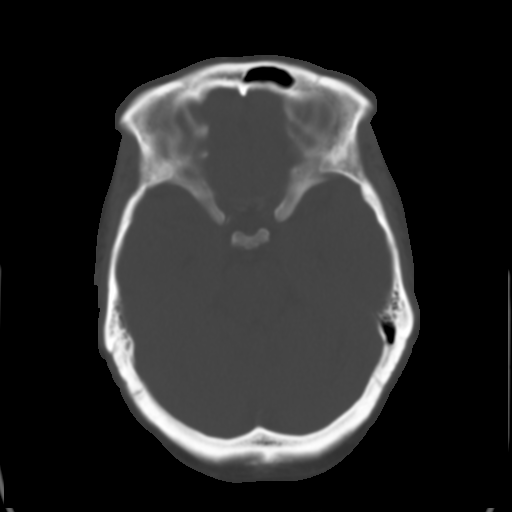
[im 11/31  brain]
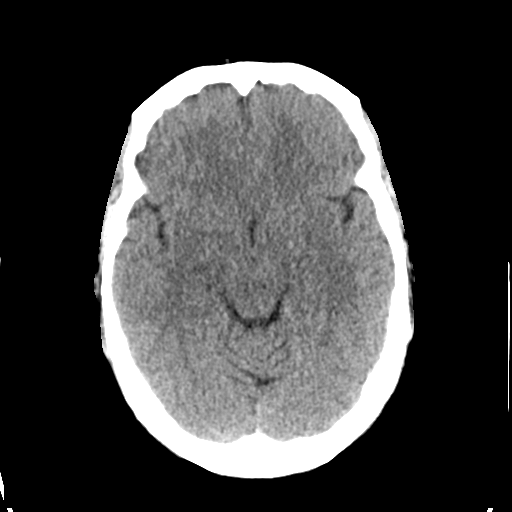
[im 13/31  brain]
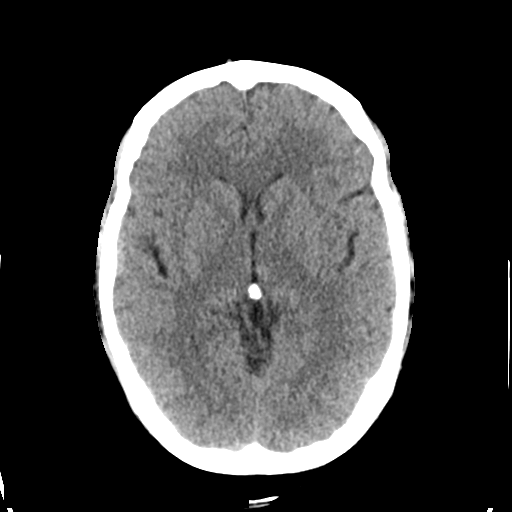
[im 15/31  brain]
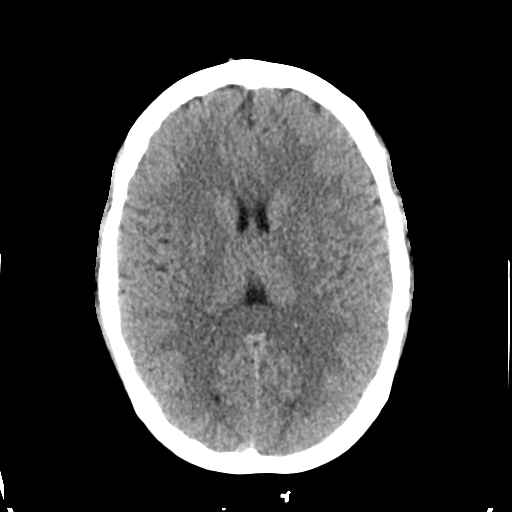
[im 16/31  brain]
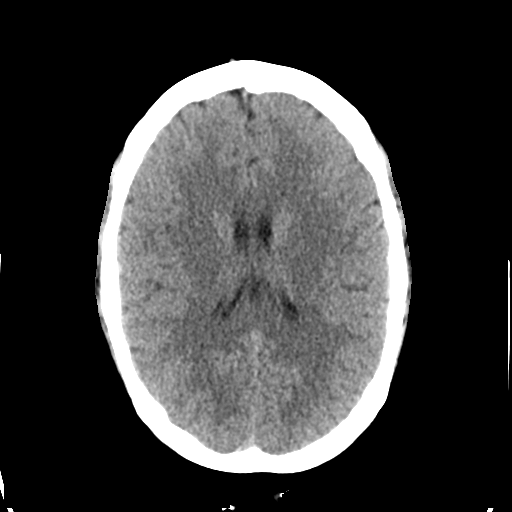
[im 16/31  bone]
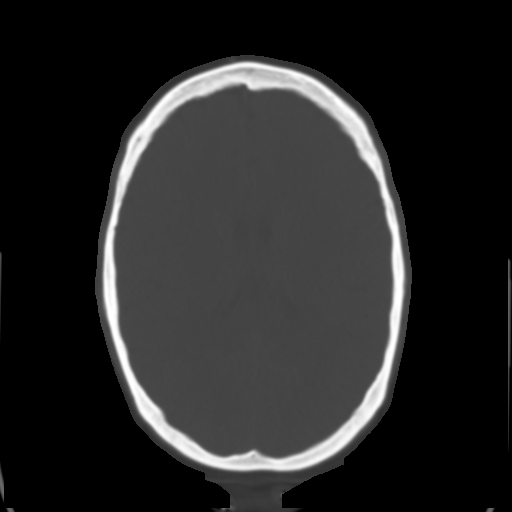
[im 18/31  brain]
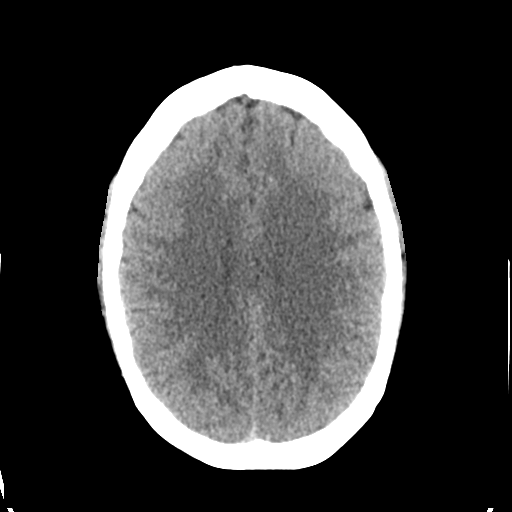
[im 20/31  brain]
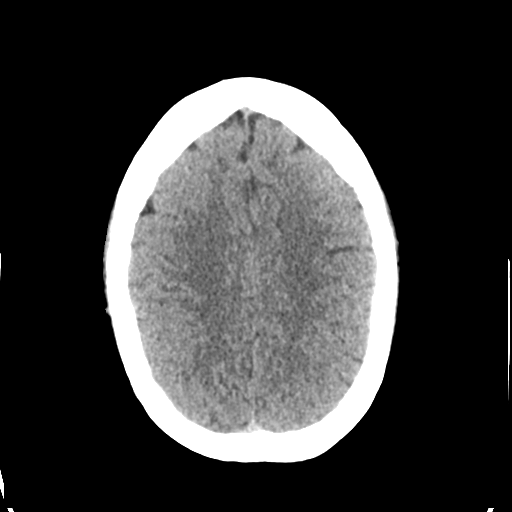
[im 22/31  brain]
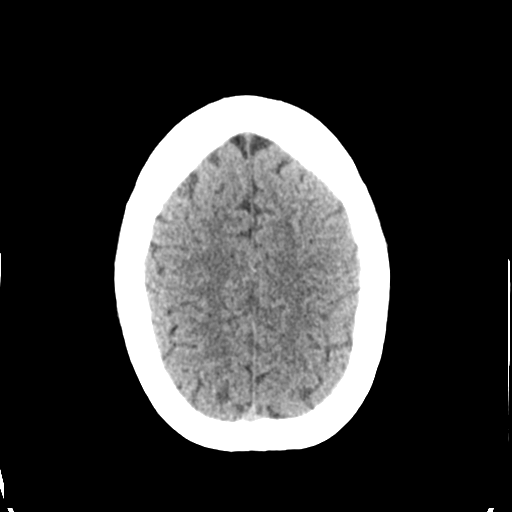
[im 23/31  brain]
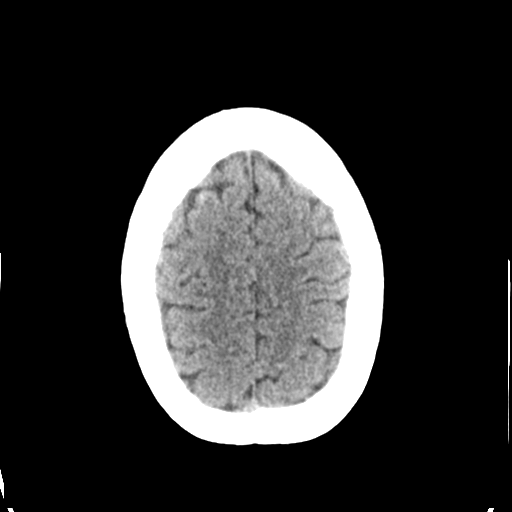
[im 23/31  bone]
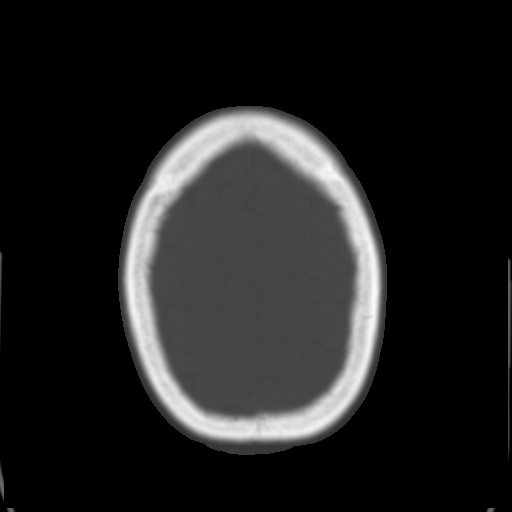
[im 25/31  brain]
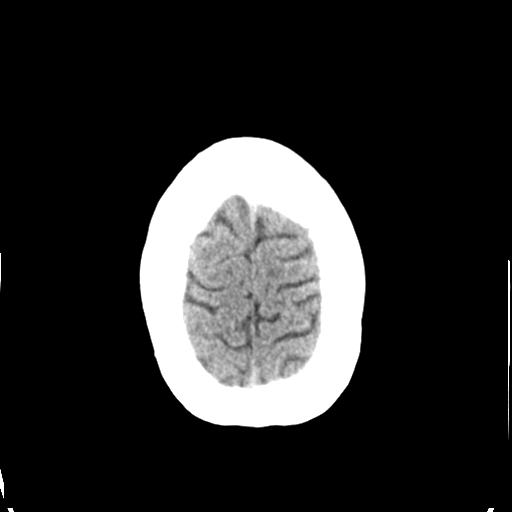
[im 27/31  brain]
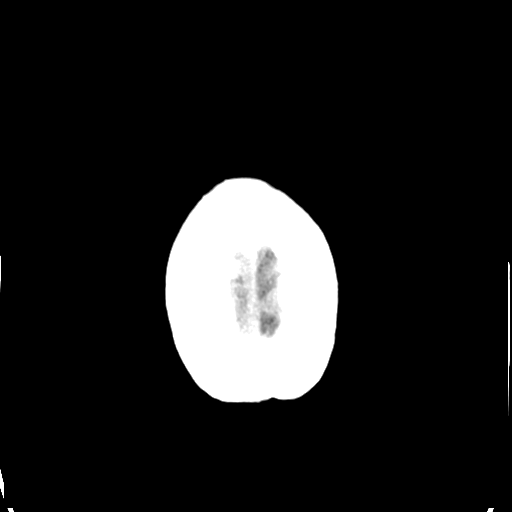
[im 29/31  brain]
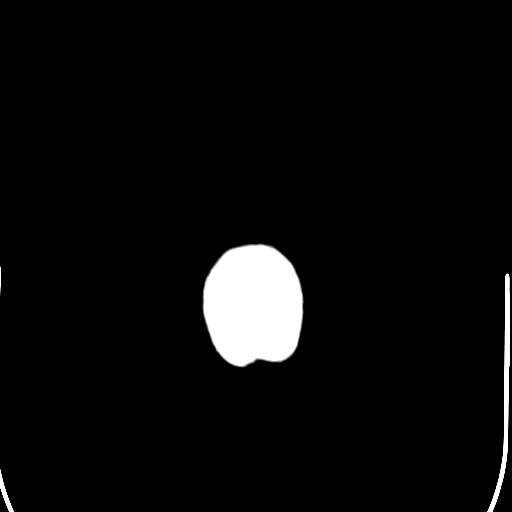

[16 of 30 positions shown; findings below may reference images not displayed]

FINDINGS: The bony calvarium is intact.  No gross soft tissue
abnormality is seen.  No acute hemorrhage, acute infarction or
space-occupying mass lesion is noted.
IMPRESSION: No acute abnormality noted.

## 2013-10-01 ENCOUNTER — Emergency Department (HOSPITAL_COMMUNITY)
Admission: EM | Admit: 2013-10-01 | Discharge: 2013-10-01 | Disposition: A | Discharge status: Home / Self Care | Payer: Medicare Other | Attending: Emergency Medicine | Admitting: Emergency Medicine

## 2013-10-01 ENCOUNTER — Encounter (HOSPITAL_COMMUNITY): Payer: Self-pay | Admitting: Emergency Medicine

## 2013-10-01 ENCOUNTER — Emergency Department (HOSPITAL_COMMUNITY): Payer: Medicare Other

## 2013-10-01 DIAGNOSIS — IMO0002 Reserved for concepts with insufficient information to code with codable children: Secondary | ICD-10-CM | POA: Insufficient documentation

## 2013-10-01 DIAGNOSIS — G8929 Other chronic pain: Secondary | ICD-10-CM | POA: Insufficient documentation

## 2013-10-01 DIAGNOSIS — Z8739 Personal history of other diseases of the musculoskeletal system and connective tissue: Secondary | ICD-10-CM | POA: Insufficient documentation

## 2013-10-01 DIAGNOSIS — G40909 Epilepsy, unspecified, not intractable, without status epilepticus: Secondary | ICD-10-CM | POA: Insufficient documentation

## 2013-10-01 DIAGNOSIS — S0993XA Unspecified injury of face, initial encounter: Secondary | ICD-10-CM | POA: Insufficient documentation

## 2013-10-01 DIAGNOSIS — Y939 Activity, unspecified: Secondary | ICD-10-CM | POA: Insufficient documentation

## 2013-10-01 DIAGNOSIS — R209 Unspecified disturbances of skin sensation: Secondary | ICD-10-CM | POA: Insufficient documentation

## 2013-10-01 DIAGNOSIS — S199XXA Unspecified injury of neck, initial encounter: Principal | ICD-10-CM

## 2013-10-01 DIAGNOSIS — Z79899 Other long term (current) drug therapy: Secondary | ICD-10-CM | POA: Insufficient documentation

## 2013-10-01 DIAGNOSIS — F319 Bipolar disorder, unspecified: Secondary | ICD-10-CM | POA: Insufficient documentation

## 2013-10-01 DIAGNOSIS — Y929 Unspecified place or not applicable: Secondary | ICD-10-CM | POA: Insufficient documentation

## 2013-10-01 DIAGNOSIS — W010XXA Fall on same level from slipping, tripping and stumbling without subsequent striking against object, initial encounter: Secondary | ICD-10-CM | POA: Insufficient documentation

## 2013-10-01 DIAGNOSIS — I10 Essential (primary) hypertension: Secondary | ICD-10-CM | POA: Insufficient documentation

## 2013-10-01 DIAGNOSIS — M542 Cervicalgia: Secondary | ICD-10-CM

## 2013-10-01 DIAGNOSIS — F17200 Nicotine dependence, unspecified, uncomplicated: Secondary | ICD-10-CM | POA: Insufficient documentation

## 2013-10-01 MED ORDER — HYDROCODONE-ACETAMINOPHEN 5-325 MG PO TABS
2.0000 | ORAL_TABLET | Freq: Once | ORAL | Status: AC
  Administered 2013-10-01: 2 via ORAL
  Filled 2013-10-01: qty 2

## 2013-10-01 MED ORDER — HYDROCODONE-ACETAMINOPHEN 5-325 MG PO TABS: 2.0000 | ORAL_TABLET | Freq: Four times a day (QID) | ORAL | Status: DC | PRN

## 2013-10-01 NOTE — ED Notes (Signed)
Pt states she fell over 1 week ago and is now having neck pain and back pain. Hx of chronic back pain. Alert and oriented.

## 2013-10-01 NOTE — ED Notes (Signed)
Pt states she has a ride home. 

## 2013-10-01 NOTE — ED Provider Notes (Signed)
CSN: [id number]     Arrival date & time 10/01/13  1743 History  This chart was scribed for non-physician practitioner Hyman Bible working with Jasper Riling. Alvino Chapel, MD by Donato Schultz, ED Scribe. This patient was seen in room WTR6/WTR6 and the patient's care was started at 7:01 PM.     Chief Complaint  Patient presents with  . Fall  . Back Pain  . Neck Pain   Patient is a 38 y.o. female presenting with fall, back pain, and neck pain. The history is provided by the patient. No language interpreter was used.  Fall  Back Pain Associated symptoms: numbness   Neck Pain Associated symptoms: numbness    HPI Comments: KAVINA CANTAVE is a 38 y.o. female with a history of chronic back pain who presents to the Emergency Department complaining of constant neck and back pain that started a week and a half ago after the patient slipped and fell in her bathtub.  She denies hitting her head or LOC. She states that the pain she is experiencing now is different from her chronic back pain in that it is in the upper part and lateral sides of her back.  She states that she has been taking Ibuprofen and Tylenol with no relief to her symptoms.  She lists numbness and tingling in her hands and arms bilaterally as an associated symptom.  Denies nausea, vomiting, or changes in vision.  She states that she tried to make an appointment with her PCP but was unable to be scheduled for an appointment.    Past Medical History  Diagnosis Date  . Hypertension   . Chronic back pain   . Migraine headache   . Stress incontinence   . Sciatica   . Bipolar disorder    Past Surgical History  Procedure Laterality Date  . Fracture surgery    . Back surgery    . Cesarean section    . Gastric bypass    . Ankle surgery    . Lumbar disc surgery    . Lumbar fusion     Family History  Problem Relation Age of Onset  . Diabetes Mother   . Hypertension Mother   . Diabetes Father   . Hypertension Father    History   Substance Use Topics  . Smoking status: Current Every Day Smoker -- 1.00 packs/day    Types: Cigarettes  . Smokeless tobacco: Not on file  . Alcohol Use: No   OB History   Grav Para Term Preterm Abortions TAB SAB Ect Mult Living                 Review of Systems  Musculoskeletal: Positive for back pain and neck pain.  Neurological: Positive for numbness.  All other systems reviewed and are negative.     Allergies  Ketorolac tromethamine and Methocarbamol  Home Medications   Prior to Admission medications   Medication Sig Start Date End Date Taking? Authorizing Provider  amitriptyline (ELAVIL) 50 MG tablet Take 50 mg by mouth at bedtime.    Historical Provider, MD  amLODipine (NORVASC) 5 MG tablet Take 5 mg by mouth daily.    Historical Provider, MD  baclofen (LIORESAL) 10 MG tablet Take 10 mg by mouth 3 (three) times daily.     Historical Provider, MD  cetirizine (ZYRTEC) 10 MG tablet Take 10 mg by mouth daily.    Historical Provider, MD  DULoxetine (CYMBALTA) 60 MG capsule Take 60 mg by mouth daily.  Historical Provider, MD  gabapentin (NEURONTIN) 300 MG capsule Take 300 mg by mouth 3 (three) times daily.    Historical Provider, MD  omeprazole (PRILOSEC) 20 MG capsule Take 20 mg by mouth daily.    Historical Provider, MD   Triage Vitals: BP 130/100  Pulse 107  Temp(Src) 99.7 F (37.6 C) (Oral)  Resp 24  Wt 250 lb (113.399 kg)  SpO2 100%  LMP 08/01/2013  Physical Exam  Nursing note and vitals reviewed. Constitutional: She appears well-developed and well-nourished. She appears distressed.  Tearful.  HENT:  Head: Normocephalic and atraumatic.  Cardiovascular: Normal rate, regular rhythm and normal heart sounds.  Exam reveals no gallop and no friction rub.   No murmur heard. Pulses:      Radial pulses are 2+ on the right side, and 2+ on the left side.  Pulmonary/Chest: Effort normal and breath sounds normal. No respiratory distress. She has no wheezes. She has no  rales.  Musculoskeletal:       Cervical back: She exhibits tenderness. She exhibits no deformity.       Thoracic back: She exhibits tenderness. She exhibits no deformity.  Tenderness to palpation of cervical and thoracic spine.  No step-offs or deformities.   Neurological: She is alert.  Sensation in both hands is intact.  Skin: Skin is warm and dry.  Psychiatric: She has a normal mood and affect. Her behavior is normal.    ED Course  Procedures (including critical care time)  DIAGNOSTIC STUDIES: Oxygen Saturation is 100% on room air, normal by my interpretation.    COORDINATION OF CARE: 7:05 PM- Discussed obtaining an x-ray of the patient's upper back and neck and administering pain medication in the ED.  The patient agreed to the treatment plan.   Labs Review Labs Reviewed - No data to display  Imaging Review No results found.   EKG Interpretation None      MDM   Final diagnoses:  None   Patient presenting today with neck and back pain after a mechanical fall.  Xrays negative.  Patient neurovascularly intact.  Patient stable for discharge.  Return precautions given.   Hyman Bible, PA-C 10/04/13 [phone]

## 2013-10-01 NOTE — ED Notes (Signed)
Pt in wheelchair to exam room.

## 2013-10-01 NOTE — ED Notes (Signed)
Per EMS come from home c/o neck and back pain after a fall over a week and half ago.  Pt states that she called her PCP and was unable to be seen by them today. Pt does have PMH chronic back and neck pain.

## 2013-10-01 NOTE — Discharge Instructions (Signed)
Take pain medication as needed for pain.  Do not drive or operate heavy machinery for 4-6 hours after taking pain medication. °

## 2013-10-05 NOTE — ED Provider Notes (Signed)
Medical screening examination/treatment/procedure(s) were performed by non-physician practitioner and as supervising physician I was immediately available for consultation/collaboration.   EKG Interpretation None       Terri [name]. Alvino Chapel, MD 10/05/13 2325

## 2013-10-29 IMAGING — CR DG CHEST 2V
2 series · 2 of 2 positions shown · non-contrast
Comparison: 03/27/2004

CLINICAL DATA: Chest pain

EXAM:
CHEST  2 VIEW

[w chest pa]
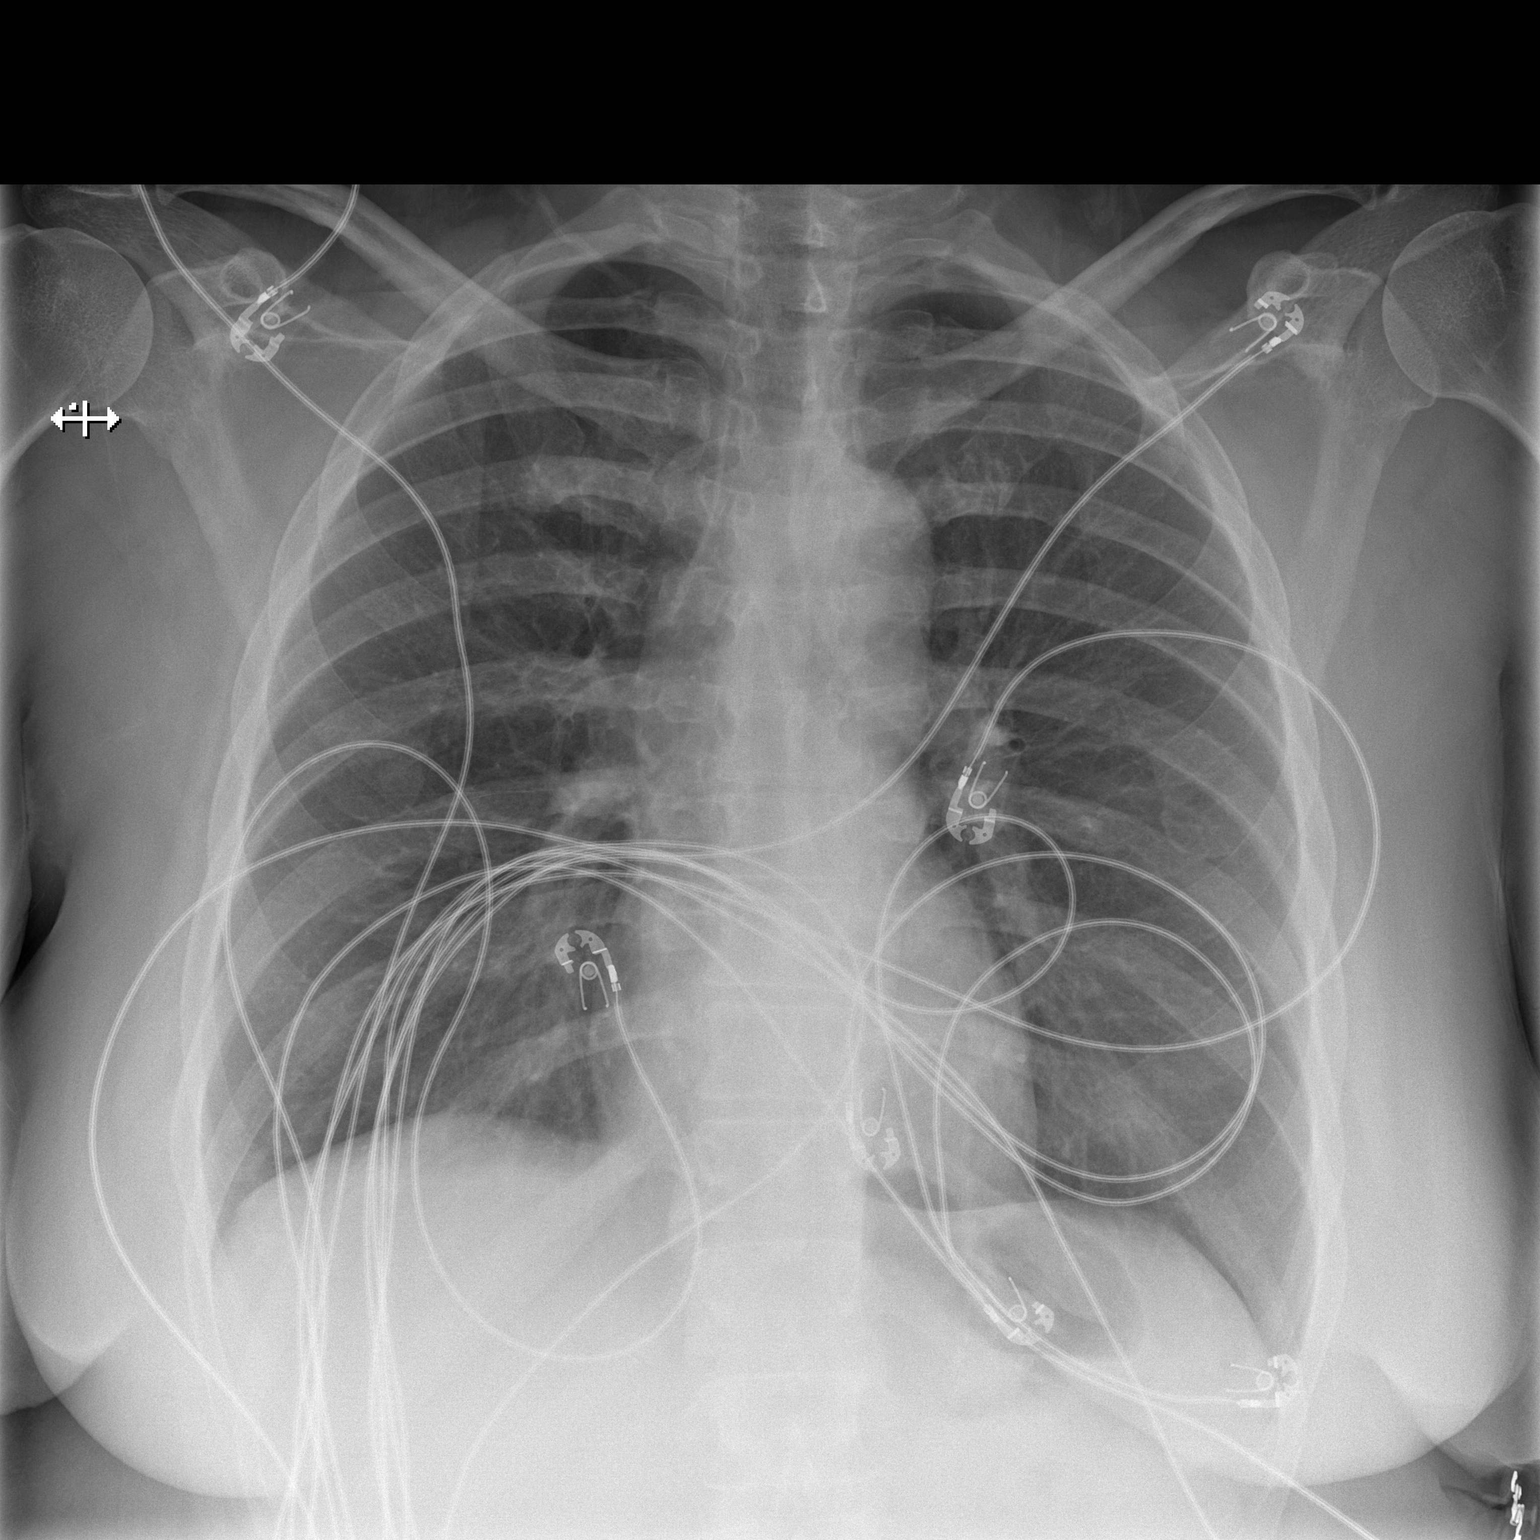

[w chest lat]
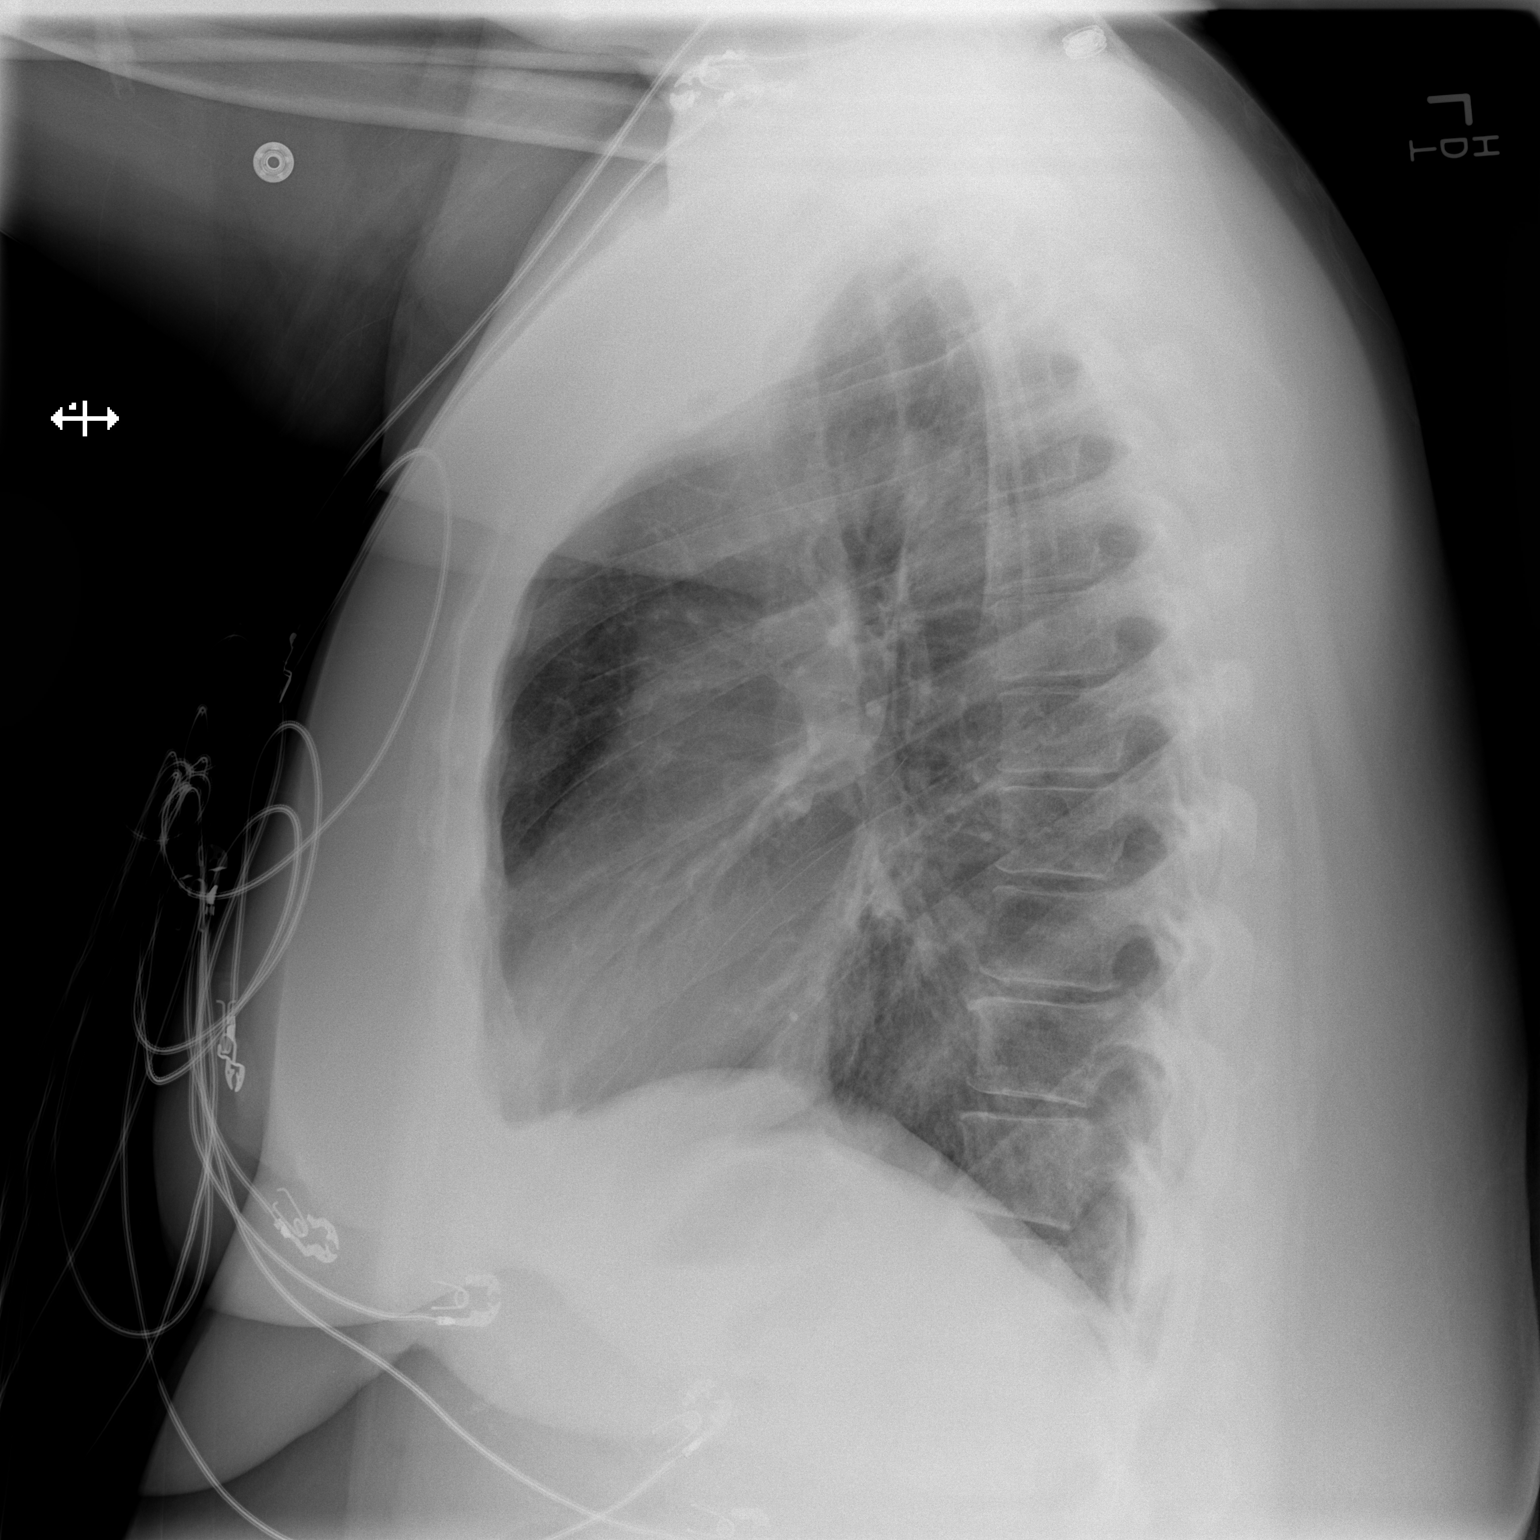

[2 of 2 positions shown; findings below may reference images not displayed]

FINDINGS: The heart size and mediastinal contours are within normal limits. No
acute infiltrate or pulmonary edema. Bony thorax is stable.
IMPRESSION: No active cardiopulmonary disease.

## 2013-12-25 ENCOUNTER — Emergency Department (HOSPITAL_COMMUNITY)
Admission: EM | Admit: 2013-12-25 | Discharge: 2013-12-25 | Discharge status: Against Medical Advice | Payer: Medicare Other | Attending: Emergency Medicine | Admitting: Emergency Medicine

## 2013-12-25 ENCOUNTER — Encounter (HOSPITAL_COMMUNITY): Payer: Self-pay | Admitting: Emergency Medicine

## 2013-12-25 DIAGNOSIS — F319 Bipolar disorder, unspecified: Secondary | ICD-10-CM | POA: Diagnosis not present

## 2013-12-25 DIAGNOSIS — Z8742 Personal history of other diseases of the female genital tract: Secondary | ICD-10-CM | POA: Insufficient documentation

## 2013-12-25 DIAGNOSIS — R1013 Epigastric pain: Secondary | ICD-10-CM | POA: Diagnosis present

## 2013-12-25 DIAGNOSIS — G8929 Other chronic pain: Secondary | ICD-10-CM | POA: Diagnosis not present

## 2013-12-25 DIAGNOSIS — Z79899 Other long term (current) drug therapy: Secondary | ICD-10-CM | POA: Diagnosis not present

## 2013-12-25 DIAGNOSIS — Z8739 Personal history of other diseases of the musculoskeletal system and connective tissue: Secondary | ICD-10-CM | POA: Diagnosis not present

## 2013-12-25 DIAGNOSIS — G43909 Migraine, unspecified, not intractable, without status migrainosus: Secondary | ICD-10-CM | POA: Diagnosis not present

## 2013-12-25 DIAGNOSIS — F17200 Nicotine dependence, unspecified, uncomplicated: Secondary | ICD-10-CM | POA: Insufficient documentation

## 2013-12-25 DIAGNOSIS — Z9889 Other specified postprocedural states: Secondary | ICD-10-CM | POA: Insufficient documentation

## 2013-12-25 DIAGNOSIS — I10 Essential (primary) hypertension: Secondary | ICD-10-CM | POA: Insufficient documentation

## 2013-12-25 DIAGNOSIS — R112 Nausea with vomiting, unspecified: Secondary | ICD-10-CM | POA: Insufficient documentation

## 2013-12-25 LAB — COMPREHENSIVE METABOLIC PANEL
ALT: 6 U/L (ref 0–35)
AST: 12 U/L (ref 0–37)
Albumin: 3.1 g/dL — ABNORMAL LOW (ref 3.5–5.2)
Alkaline Phosphatase: 94 U/L (ref 39–117)
Anion gap: 12 (ref 5–15)
BUN: 10 mg/dL (ref 6–23)
CALCIUM: 8.6 mg/dL (ref 8.4–10.5)
CO2: 23 mEq/L (ref 19–32)
Chloride: 106 mEq/L (ref 96–112)
Creatinine, Ser: 0.56 mg/dL (ref 0.50–1.10)
GFR calc Af Amer: >90 mL/min (ref >90)
Glucose, Bld: 99 mg/dL (ref 70–99)
POTASSIUM: 4.2 meq/L (ref 3.7–5.3)
SODIUM: 141 meq/L (ref 137–147)
TOTAL PROTEIN: 5.9 g/dL — AB (ref 6.0–8.3)
Total Bilirubin: <0.2 mg/dL — ABNORMAL LOW (ref 0.3–1.2)

## 2013-12-25 LAB — CBC
HCT: 33 % — ABNORMAL LOW (ref 36.0–46.0)
Hemoglobin: 11.3 g/dL — ABNORMAL LOW (ref 12.0–15.0)
MCH: 26 pg (ref 26.0–34.0)
MCHC: 34.2 g/dL (ref 30.0–36.0)
MCV: 76 fL — ABNORMAL LOW (ref 78.0–100.0)
PLATELETS: 275 10*3/uL (ref 150–400)
RBC: 4.34 MIL/uL (ref 3.87–5.11)
RDW: 16.7 % — ABNORMAL HIGH (ref 11.5–15.5)
WBC: 12.7 10*3/uL — ABNORMAL HIGH (ref 4.0–10.5)

## 2013-12-25 LAB — TROPONIN I: Troponin I: <0.3 ng/mL (ref <0.30)

## 2013-12-25 LAB — LIPASE, BLOOD: LIPASE: 38 U/L (ref 11–59)

## 2013-12-25 MED ORDER — IOHEXOL 300 MG/ML  SOLN
25.0000 mL | Freq: Once | INTRAMUSCULAR | Status: DC | PRN
Start: 2013-12-25 — End: 2013-12-25

## 2013-12-25 MED ORDER — GI COCKTAIL ~~LOC~~
30.0000 mL | Freq: Once | ORAL | Status: AC
  Administered 2013-12-25: 30 mL via ORAL
  Filled 2013-12-25: qty 30

## 2013-12-25 NOTE — ED Notes (Signed)
The patient left AMA.  She advised me to not get near her and that she would take her own IV out.  MD was at the bedside.  She walked out with the person that was with her.

## 2013-12-25 NOTE — ED Provider Notes (Signed)
CSN: [id number]     Arrival date & time 12/25/13  1459 History   First MD Initiated Contact with Patient 12/25/13 1501     Chief Complaint  Patient presents with  . Abdominal Pain   (Consider location/radiation/quality/duration/timing/severity/associated sxs/prior Treatment) Patient is a 38 y.o. female presenting with abdominal pain. The history is provided by the patient.  Abdominal Pain Pain location:  Epigastric Pain quality: fullness and sharp   Pain radiates to:  LUQ Pain severity:  Severe Onset quality:  Gradual Duration:  2 weeks Timing:  Intermittent Progression:  Worsening Chronicity:  New Context: eating and previous surgery ( Gastric bypass)   Relieved by:  Nothing Worsened by:  Eating Ineffective treatments: PPIs. 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    Past Medical History  Diagnosis Date  . Hypertension   . Chronic back pain   . Migraine headache   . Stress incontinence   . Sciatica   . Bipolar disorder    Past Surgical History  Procedure Laterality Date  . Fracture surgery    . Back surgery    . Cesarean section    . Gastric bypass    . Ankle surgery    . Lumbar disc surgery    . Lumbar fusion    . Tubal ligation     Family History  Problem Relation Age of Onset  . Diabetes Mother   . Hypertension Mother   . Diabetes Father   . Hypertension Father    History  Substance Use Topics  . Smoking status:  Current Every Day Smoker -- 1.00 packs/day    Types: Cigarettes  . Smokeless tobacco: Not on file  . Alcohol Use: No   OB History   Grav Para Term Preterm Abortions TAB SAB Ect Mult Living                 Review of Systems  Constitutional: Negative for fever and chills.  Respiratory: Negative for cough and shortness of breath.   Cardiovascular: Negative for chest pain.  Gastrointestinal: Positive for nausea, vomiting and abdominal pain. Negative for diarrhea, constipation, hematochezia and hematemesis.  Genitourinary: Negative for hematuria.  All other systems reviewed and are negative.     Allergies  Ketorolac tromethamine and Methocarbamol  Home Medications   Prior to Admission medications   Medication Sig Start Date End Date Taking? Authorizing Provider  amitriptyline (ELAVIL) 50 MG tablet Take 50 mg by mouth at bedtime.   Yes Historical Provider, MD  amLODipine (NORVASC) 5 MG tablet Take 5 mg by mouth daily.   Yes Historical Provider, MD  baclofen (LIORESAL) 10 MG tablet Take 10 mg by mouth 3 (three) times daily.    Yes Historical Provider, MD  calcium carbonate (TUMS EX) 750 MG chewable tablet Chew 4 tablets by mouth daily.   Yes Historical Provider, MD  cetirizine (ZYRTEC) 10 MG tablet Take 10 mg by mouth  daily.   Yes Historical Provider, MD  DULoxetine (CYMBALTA) 60 MG capsule Take 60 mg by mouth daily.   Yes Historical Provider, MD  gabapentin (NEURONTIN) 300 MG capsule Take 300 mg by mouth 3 (three) times daily.   Yes Historical Provider, MD  omeprazole (PRILOSEC) 20 MG capsule Take 20 mg by mouth daily.   Yes Historical Provider, MD   BP 141/103  Pulse 80  Temp(Src) 98.8 F (37.1 C) (Oral)  Resp 19  SpO2 98% Physical Exam  Constitutional: She is oriented to person, place, and time. She appears well-developed and well-nourished. She appears distressed.  Crying at bedside  HENT:  Head: Normocephalic and atraumatic.  Eyes: Pupils are equal, round, and reactive  to light.  Cardiovascular: Normal rate and regular rhythm.  Exam reveals no gallop and no friction rub.   No murmur heard. Pulmonary/Chest: Effort normal and breath sounds normal. She has no wheezes. She has no rales.  Abdominal: Soft. Bowel sounds are normal. She exhibits no distension and no mass. There is no tenderness. There is no rebound and no guarding.  Musculoskeletal: She exhibits no edema.  Neurological: She is alert and oriented to person, place, and time.    ED Course  Procedures (including critical care time) Labs Review Labs Reviewed  CBC - Abnormal; Notable for the following:    WBC 12.7 (*)    Hemoglobin 11.3 (*)    HCT 33.0 (*)    MCV 76.0 (*)    RDW 16.7 (*)    All other components within normal limits  COMPREHENSIVE METABOLIC PANEL - Abnormal; Notable for the following:    Total Protein 5.9 (*)    Albumin 3.1 (*)    Total Bilirubin <0.2 (*)    All other components within normal limits  LIPASE, BLOOD  TROPONIN I  URINALYSIS, ROUTINE W REFLEX MICROSCOPIC    Imaging Review No results found.   EKG Interpretation None      MDM   Final diagnoses:  None   Patient has abruptly left AMA. Patient had a ready received by mouth contrast for abdominal imaging and had received medications for her pain. Her main complaint was that she had been waiting for too long for her workup to be completed. She was counseled on the risks of leaving [place], and chose to leave. Of the workup that had been completed, no evidence of any acute abdominal pathology to explain patient's abdominal pain.      Luan Moore, MD 12/25/13 1800

## 2013-12-25 NOTE — ED Notes (Signed)
Per EMS, pt comes from home with c/o epigastric and LUQ pain for the past 2 weeks, progressively worse today. Pt A&OX4, NAD noted. Pt denies chest pain and n/v/d. No weakness noted. Pt tearful. VSS: BP 146/96, P104, ST on monitor. 22G IV placed in left AC. No meds given in route.

## 2013-12-25 NOTE — ED Provider Notes (Signed)
I saw and evaluated the patient, reviewed the resident's note and I agree with the findings and plan.   EKG Interpretation None      Here for epigastric pain s/p bypass surgery 3 years ago. Patient has had pain for 2-3 weeks. Worsening. On my exam epigastric pain. Will scan. Patient left AMA after getting pain meds and while awaiting CT scan.  Evelina Bucy, MD 12/25/13 2008

## 2013-12-25 NOTE — ED Notes (Signed)
Patient started ripping all of her monitor off, she advised she had been waiting since 1515hrs and she could do that at home.  The MD went in there and she told him the same that she has been waiting for two hours.  She walked out screaming at the person with her, "don't sign anything, don't sign anything".  The patient left AMA.

## 2013-12-25 NOTE — ED Notes (Signed)
CT notified pt has finished drinking contrast 

## 2013-12-28 ENCOUNTER — Encounter (HOSPITAL_COMMUNITY): Payer: Self-pay | Admitting: Emergency Medicine

## 2013-12-28 ENCOUNTER — Emergency Department (HOSPITAL_COMMUNITY)
Admission: EM | Admit: 2013-12-28 | Discharge: 2013-12-28 | Disposition: A | Discharge status: Home / Self Care | Payer: Medicare Other | Attending: Emergency Medicine | Admitting: Emergency Medicine

## 2013-12-28 ENCOUNTER — Emergency Department (HOSPITAL_COMMUNITY): Payer: Medicare Other

## 2013-12-28 DIAGNOSIS — G43909 Migraine, unspecified, not intractable, without status migrainosus: Secondary | ICD-10-CM | POA: Diagnosis not present

## 2013-12-28 DIAGNOSIS — G8929 Other chronic pain: Secondary | ICD-10-CM | POA: Insufficient documentation

## 2013-12-28 DIAGNOSIS — F17200 Nicotine dependence, unspecified, uncomplicated: Secondary | ICD-10-CM | POA: Diagnosis not present

## 2013-12-28 DIAGNOSIS — Z9851 Tubal ligation status: Secondary | ICD-10-CM | POA: Insufficient documentation

## 2013-12-28 DIAGNOSIS — I10 Essential (primary) hypertension: Secondary | ICD-10-CM | POA: Insufficient documentation

## 2013-12-28 DIAGNOSIS — R1013 Epigastric pain: Secondary | ICD-10-CM | POA: Insufficient documentation

## 2013-12-28 DIAGNOSIS — R112 Nausea with vomiting, unspecified: Secondary | ICD-10-CM | POA: Insufficient documentation

## 2013-12-28 DIAGNOSIS — Z79899 Other long term (current) drug therapy: Secondary | ICD-10-CM | POA: Insufficient documentation

## 2013-12-28 DIAGNOSIS — F319 Bipolar disorder, unspecified: Secondary | ICD-10-CM | POA: Insufficient documentation

## 2013-12-28 DIAGNOSIS — N390 Urinary tract infection, site not specified: Secondary | ICD-10-CM | POA: Insufficient documentation

## 2013-12-28 DIAGNOSIS — Z9889 Other specified postprocedural states: Secondary | ICD-10-CM | POA: Diagnosis not present

## 2013-12-28 LAB — URINALYSIS, ROUTINE W REFLEX MICROSCOPIC
BILIRUBIN URINE: NEGATIVE
Glucose, UA: NEGATIVE mg/dL
Ketones, ur: NEGATIVE mg/dL
NITRITE: NEGATIVE
Protein, ur: NEGATIVE mg/dL
Specific Gravity, Urine: 1.007 (ref 1.005–1.030)
UROBILINOGEN UA: 0.2 mg/dL (ref 0.0–1.0)
pH: 7 (ref 5.0–8.0)

## 2013-12-28 LAB — COMPREHENSIVE METABOLIC PANEL
ALT: 7 U/L (ref 0–35)
ANION GAP: 13 (ref 5–15)
AST: 14 U/L (ref 0–37)
Albumin: 3.5 g/dL (ref 3.5–5.2)
Alkaline Phosphatase: 109 U/L (ref 39–117)
BILIRUBIN TOTAL: 0.4 mg/dL (ref 0.3–1.2)
BUN: 6 mg/dL (ref 6–23)
CHLORIDE: 105 meq/L (ref 96–112)
CO2: 22 meq/L (ref 19–32)
CREATININE: 0.48 mg/dL — AB (ref 0.50–1.10)
Calcium: 9.3 mg/dL (ref 8.4–10.5)
GFR calc Af Amer: >90 mL/min (ref >90)
Glucose, Bld: 81 mg/dL (ref 70–99)
Potassium: 4.2 mEq/L (ref 3.7–5.3)
Sodium: 140 mEq/L (ref 137–147)
Total Protein: 6.9 g/dL (ref 6.0–8.3)

## 2013-12-28 LAB — URINE MICROSCOPIC-ADD ON

## 2013-12-28 LAB — CBC
HCT: 35.7 % — ABNORMAL LOW (ref 36.0–46.0)
Hemoglobin: 12.4 g/dL (ref 12.0–15.0)
MCH: 26.1 pg (ref 26.0–34.0)
MCHC: 34.7 g/dL (ref 30.0–36.0)
MCV: 75.2 fL — AB (ref 78.0–100.0)
Platelets: 299 10*3/uL (ref 150–400)
RBC: 4.75 MIL/uL (ref 3.87–5.11)
RDW: 17 % — ABNORMAL HIGH (ref 11.5–15.5)
WBC: 8.9 10*3/uL (ref 4.0–10.5)

## 2013-12-28 LAB — LIPASE, BLOOD: LIPASE: 29 U/L (ref 11–59)

## 2013-12-28 MED ORDER — GI COCKTAIL ~~LOC~~
30.0000 mL | Freq: Once | ORAL | Status: AC
  Administered 2013-12-28: 30 mL via ORAL
  Filled 2013-12-28: qty 30

## 2013-12-28 MED ORDER — OXYCODONE-ACETAMINOPHEN 5-325 MG PO TABS: 1.0000 | ORAL_TABLET | Freq: Four times a day (QID) | ORAL | Status: DC | PRN

## 2013-12-28 MED ORDER — CEPHALEXIN 500 MG PO CAPS: 500.0000 mg | ORAL_CAPSULE | Freq: Four times a day (QID) | ORAL | Status: DC

## 2013-12-28 MED ORDER — PANTOPRAZOLE SODIUM 40 MG PO TBEC: 40.0000 mg | DELAYED_RELEASE_TABLET | Freq: Once | ORAL | Status: DC

## 2013-12-28 MED ORDER — IOHEXOL 300 MG/ML  SOLN
50.0000 mL | Freq: Once | INTRAMUSCULAR | Status: AC | PRN
  Administered 2013-12-28: 50 mL via ORAL

## 2013-12-28 MED ORDER — DEXTROSE 5 % IV SOLN
1.0000 g | INTRAVENOUS | Status: DC
  Administered 2013-12-28: 1 g via INTRAVENOUS
  Filled 2013-12-28: qty 10

## 2013-12-28 MED ORDER — SUCRALFATE 1 G PO TABS
1.0000 g | ORAL_TABLET | Freq: Once | ORAL | Status: AC
  Administered 2013-12-28: 1 g via ORAL
  Filled 2013-12-28: qty 1

## 2013-12-28 MED ORDER — PANTOPRAZOLE SODIUM 40 MG PO TBEC
40.0000 mg | DELAYED_RELEASE_TABLET | Freq: Once | ORAL | Status: AC
  Administered 2013-12-28: 40 mg via ORAL
  Filled 2013-12-28: qty 1

## 2013-12-28 MED ORDER — IOHEXOL 300 MG/ML  SOLN
100.0000 mL | Freq: Once | INTRAMUSCULAR | Status: AC | PRN
  Administered 2013-12-28: 100 mL via INTRAVENOUS

## 2013-12-28 MED ORDER — SODIUM CHLORIDE 0.9 % IV BOLUS (SEPSIS)
1000.0000 mL | Freq: Once | INTRAVENOUS | Status: AC
Start: 2013-12-28 — End: 2013-12-28
  Administered 2013-12-28: 1000 mL via INTRAVENOUS

## 2013-12-28 MED ORDER — SUCRALFATE 1 G PO TABS: 1.0000 g | ORAL_TABLET | Freq: Four times a day (QID) | ORAL | Status: DC

## 2013-12-28 MED ORDER — HYDROMORPHONE HCL PF 1 MG/ML IJ SOLN
1.0000 mg | Freq: Once | INTRAMUSCULAR | Status: AC
  Administered 2013-12-28: 1 mg via INTRAVENOUS
  Filled 2013-12-28: qty 1

## 2013-12-28 NOTE — Discharge Instructions (Signed)
Abdominal Pain, Women °Abdominal (stomach, pelvic, or belly) pain can be caused by many things. It is important to tell your doctor: °· The location of the pain. °· Does it come and go or is it present all the time? °· Are there things that start the pain (eating certain foods, exercise)? °· Are there other symptoms associated with the pain (fever, nausea, vomiting, diarrhea)? °All of this is helpful to know when trying to find the cause of the pain. °CAUSES  °· Stomach: virus or bacteria infection, or ulcer. °· Intestine: appendicitis (inflamed appendix), regional ileitis (Crohn's disease), ulcerative colitis (inflamed colon), irritable bowel syndrome, diverticulitis (inflamed diverticulum of the colon), or cancer of the stomach or intestine. °· Gallbladder disease or stones in the gallbladder. °· Kidney disease, kidney stones, or infection. °· Pancreas infection or cancer. °· Fibromyalgia (pain disorder). °· Diseases of the female organs: °¨ Uterus: fibroid (non-cancerous) tumors or infection. °¨ Fallopian tubes: infection or tubal pregnancy. °¨ Ovary: cysts or tumors. °¨ Pelvic adhesions (scar tissue). °¨ Endometriosis (uterus lining tissue growing in the pelvis and on the pelvic organs). °¨ Pelvic congestion syndrome (female organs filling up with blood just before the menstrual period). °¨ Pain with the menstrual period. °¨ Pain with ovulation (producing an egg). °¨ Pain with an IUD (intrauterine device, birth control) in the uterus. °¨ Cancer of the female organs. °· Functional pain (pain not caused by a disease, may improve without treatment). °· Psychological pain. °· Depression. °DIAGNOSIS  °Your doctor will decide the seriousness of your pain by doing an examination. °· Blood tests. °· X-rays. °· Ultrasound. °· CT scan (computed tomography, special type of X-ray). °· MRI (magnetic resonance imaging). °· Cultures, for infection. °· Barium enema (dye inserted in the large intestine, to better view it with  X-rays). °· Colonoscopy (looking in intestine with a lighted tube). °· Laparoscopy (minor surgery, looking in abdomen with a lighted tube). °· Major abdominal exploratory surgery (looking in abdomen with a large incision). °TREATMENT  °The treatment will depend on the cause of the pain.  °· Many cases can be observed and treated at home. °· Over-the-counter medicines recommended by your caregiver. °· Prescription medicine. °· Antibiotics, for infection. °· Birth control pills, for painful periods or for ovulation pain. °· Hormone treatment, for endometriosis. °· Nerve blocking injections. °· Physical therapy. °· Antidepressants. °· Counseling with a psychologist or psychiatrist. °· Minor or major surgery. °HOME CARE INSTRUCTIONS  °· Do not take laxatives, unless directed by your caregiver. °· Take over-the-counter pain medicine only if ordered by your caregiver. Do not take aspirin because it can cause an upset stomach or bleeding. °· Try a clear liquid diet (broth or water) as ordered by your caregiver. Slowly move to a bland diet, as tolerated, if the pain is related to the stomach or intestine. °· Have a thermometer and take your temperature several times a day, and record it. °· Bed rest and sleep, if it helps the pain. °· Avoid sexual intercourse, if it causes pain. °· Avoid stressful situations. °· Keep your follow-up appointments and tests, as your caregiver orders. °· If the pain does not go away with medicine or surgery, you may try: °¨ Acupuncture. °¨ Relaxation exercises (yoga, meditation). °¨ Group therapy. °¨ Counseling. °SEEK MEDICAL CARE IF:  °· You notice certain foods cause stomach pain. °· Your home care treatment is not helping your pain. °· You need stronger pain medicine. °· You want your IUD removed. °· You feel faint or   lightheaded. °· You develop nausea and vomiting. °· You develop a rash. °· You are having side effects or an allergy to your medicine. °SEEK IMMEDIATE MEDICAL CARE IF:  °· Your  pain does not go away or gets worse. °· You have a fever. °· Your pain is felt only in portions of the abdomen. The right side could possibly be appendicitis. The left lower portion of the abdomen could be colitis or diverticulitis. °· You are passing blood in your stools (bright red or black tarry stools, with or without vomiting). °· You have blood in your urine. °· You develop chills, with or without a fever. °· You pass out. °MAKE SURE YOU:  °· Understand these instructions. °· Will watch your condition. °· Will get help right away if you are not doing well or get worse. °Document Released: 03/07/2007 Document Revised: 09/24/2013 Document Reviewed: 03/27/2009 °Exit[employer]® Patient Information ©2015 [employer]. This information is not intended to replace advice given to you by your health care provider. Make sure you discuss any questions you have with your health care provider. ° °

## 2013-12-28 NOTE — ED Notes (Addendum)
Pt reports abdominal pain for 4 weeks. Pt reports nausea throughout and at present time but vomiting only first 2 weeks; no vomiting now. Pt reports normal BM this morning; denies diarrhea. Pt denies blood in stool or vomit. Pt denies CP or SOB.

## 2013-12-28 NOTE — ED Notes (Signed)
Bed: WA11 Expected date:  Expected time:  Means of arrival:  Comments: EMS abdominal pain 

## 2013-12-28 NOTE — ED Provider Notes (Signed)
CSN: [id number]     Arrival date & time 12/28/13  1141 History   First MD Initiated Contact with Patient 12/28/13 1145     Chief Complaint  Patient presents with  . Abdominal Pain     (Consider location/radiation/quality/duration/timing/severity/associated sxs/prior Treatment) Patient is a 38 y.o. female presenting with abdominal pain. The history is provided by the patient.  Abdominal Pain Pain location:  Epigastric Pain quality: aching, burning and sharp   Pain radiates to:  Does not radiate Pain severity:  Moderate Onset quality:  Gradual Duration:  3 weeks Timing:  Constant Progression:  Worsening Chronicity:  New Context: previous surgery (gastric bypass 3 years ago)   Relieved by:  Nothing Worsened by:  Nothing tried Associated symptoms: nausea and vomiting (1st 2 weeks of this)   Associated symptoms: no chest pain, no chills, no cough, no fever and no shortness of breath     Past Medical History  Diagnosis Date  . Hypertension   . Chronic back pain   . Migraine headache   . Stress incontinence   . Sciatica   . Bipolar disorder    Past Surgical History  Procedure Laterality Date  . Fracture surgery    . Back surgery    . Cesarean section    . Gastric bypass    . Ankle surgery    . Lumbar disc surgery    . Lumbar fusion    . Tubal ligation     Family History  Problem Relation Age of Onset  . Diabetes Mother   . Hypertension Mother   . Diabetes Father   . Hypertension Father    History  Substance Use Topics  . Smoking status: Current Every Day Smoker -- 1.00 packs/day    Types: Cigarettes  . Smokeless tobacco: Not on file  . Alcohol Use: No   OB History   Grav Para Term Preterm Abortions TAB SAB Ect Mult Living                 Review of Systems  Constitutional: Negative for fever and chills.  Respiratory: Negative for cough and shortness of breath.   Cardiovascular: Negative for chest pain and leg swelling.  Gastrointestinal: Positive for  nausea and vomiting (1st 2 weeks of this). Negative for abdominal pain.  All other systems reviewed and are negative.     Allergies  Ketorolac tromethamine and Methocarbamol  Home Medications   Prior to Admission medications   Medication Sig Start Date End Date Taking? Authorizing Provider  amitriptyline (ELAVIL) 50 MG tablet Take 50 mg by mouth at bedtime.    Historical Provider, MD  amLODipine (NORVASC) 5 MG tablet Take 5 mg by mouth daily.    Historical Provider, MD  baclofen (LIORESAL) 10 MG tablet Take 10 mg by mouth 3 (three) times daily.     Historical Provider, MD  calcium carbonate (TUMS EX) 750 MG chewable tablet Chew 4 tablets by mouth daily.    Historical Provider, MD  cetirizine (ZYRTEC) 10 MG tablet Take 10 mg by mouth daily.    Historical Provider, MD  DULoxetine (CYMBALTA) 60 MG capsule Take 60 mg by mouth daily.    Historical Provider, MD  gabapentin (NEURONTIN) 300 MG capsule Take 300 mg by mouth 3 (three) times daily.    Historical Provider, MD  omeprazole (PRILOSEC) 20 MG capsule Take 20 mg by mouth daily.    Historical Provider, MD   BP 135/111  Pulse 91  Temp(Src) 99.3 F (37.4 C) (Oral)  Resp 17  SpO2 98% Physical Exam  Nursing note and vitals reviewed. Constitutional: She is oriented to person, place, and time. She appears well-developed and well-nourished. No distress.  HENT:  Head: Normocephalic and atraumatic.  Mouth/Throat: Oropharynx is clear and moist.  Eyes: EOM are normal. Pupils are equal, round, and reactive to light.  Neck: Normal range of motion. Neck supple.  Cardiovascular: Normal rate and regular rhythm.  Exam reveals no friction rub.   No murmur heard. Pulmonary/Chest: Effort normal and breath sounds normal. No respiratory distress. She has no wheezes. She has no rales.  Abdominal: Soft. She exhibits no distension. There is tenderness (epigastric). There is no rebound.  Musculoskeletal: Normal range of motion. She exhibits no edema.   Neurological: She is alert and oriented to person, place, and time.  Skin: No rash noted. She is not diaphoretic.    ED Course  Procedures (including critical care time) Labs Review Labs Reviewed  CBC  COMPREHENSIVE METABOLIC PANEL  LIPASE, BLOOD    Imaging Review Ct Abdomen Pelvis W Contrast  12/28/2013   CLINICAL DATA:  Epigastric pain, nausea, vomiting and history of gastric bypass surgery.  EXAM: CT ABDOMEN AND PELVIS WITH CONTRAST  TECHNIQUE: Multidetector CT imaging of the abdomen and pelvis was performed using the standard protocol following bolus administration of intravenous contrast.  CONTRAST:  59mL OMNIPAQUE IOHEXOL 300 MG/ML SOLN, 18mL OMNIPAQUE IOHEXOL 300 MG/ML SOLN  COMPARISON:  None.  FINDINGS: The patient is status post Roux-en-Y gastric bypass. There are no signs of complication by CT. The gastric remnant is not distended. There is no evidence of abnormal dilated bowel loops or leak. No abnormal fluid collections are identified. No evidence of internal hernia or bowel obstruction.  The liver, gallbladder, pancreas, spleen, adrenal glands and kidneys are within normal limits. No masses or enlarged lymph nodes are seen.  In the pelvis, cystic structure of the left adnexa measures approximately 4.2 x 5.0 cm. This most likely represents a physiologic cyst. The bladder is unremarkable. Degenerative spondylosis of the lumbar spine present at L4-5 and L5-S1.  IMPRESSION: No acute findings. Intact appearing gastric bypass without evidence of complication by CT. 5 cm left adnexal cyst is likely physiologic.   Electronically Signed   By: Aletta Edouard M.D.   On: 12/28/2013 13:55     EKG Interpretation None      MDM   Final diagnoses:  UTI (lower urinary tract infection)  Epigastric pain    74F here with epigastric pain. Sent over from her PCP's office. Present for 2-3 weeks, worsening. Seen by me and Dr. Raelene Bott 4 days ago, left AMA prior to her CT scan. No vomiting, just  having severe epigastric pain. Exam with epigastric pain. No other abdominal pain. Will CT today. CT ok, no acute findings around her bypass. UTI on UA. Given Rocephin, d/c with keflex.  Given carafate, PPI, pain meds. Given GI f/u.  Evelina Bucy, MD 12/29/13 [phone]

## 2013-12-28 NOTE — ED Notes (Signed)
Per EMS pt complaint of abdominal pain for 4 weeks. Pt referred here by PA at [place] office who suspects gastric bypass issue.

## 2014-02-10 ENCOUNTER — Encounter (HOSPITAL_COMMUNITY): Payer: Self-pay | Admitting: Emergency Medicine

## 2014-02-10 ENCOUNTER — Emergency Department (HOSPITAL_COMMUNITY)
Admission: EM | Admit: 2014-02-10 | Discharge: 2014-02-11 | Disposition: A | Discharge status: Home / Self Care | Payer: Medicare Other | Attending: Emergency Medicine | Admitting: Emergency Medicine

## 2014-02-10 DIAGNOSIS — G8929 Other chronic pain: Secondary | ICD-10-CM | POA: Insufficient documentation

## 2014-02-10 DIAGNOSIS — S71009A Unspecified open wound, unspecified hip, initial encounter: Secondary | ICD-10-CM | POA: Diagnosis not present

## 2014-02-10 DIAGNOSIS — X789XXA Intentional self-harm by unspecified sharp object, initial encounter: Secondary | ICD-10-CM | POA: Insufficient documentation

## 2014-02-10 DIAGNOSIS — S31109A Unspecified open wound of abdominal wall, unspecified quadrant without penetration into peritoneal cavity, initial encounter: Secondary | ICD-10-CM | POA: Diagnosis not present

## 2014-02-10 DIAGNOSIS — F319 Bipolar disorder, unspecified: Secondary | ICD-10-CM | POA: Diagnosis not present

## 2014-02-10 DIAGNOSIS — Z8739 Personal history of other diseases of the musculoskeletal system and connective tissue: Secondary | ICD-10-CM | POA: Diagnosis not present

## 2014-02-10 DIAGNOSIS — R45851 Suicidal ideations: Secondary | ICD-10-CM | POA: Diagnosis not present

## 2014-02-10 DIAGNOSIS — S71109A Unspecified open wound, unspecified thigh, initial encounter: Secondary | ICD-10-CM | POA: Insufficient documentation

## 2014-02-10 DIAGNOSIS — Z792 Long term (current) use of antibiotics: Secondary | ICD-10-CM | POA: Insufficient documentation

## 2014-02-10 DIAGNOSIS — G43909 Migraine, unspecified, not intractable, without status migrainosus: Secondary | ICD-10-CM | POA: Insufficient documentation

## 2014-02-10 DIAGNOSIS — Z79899 Other long term (current) drug therapy: Secondary | ICD-10-CM | POA: Diagnosis not present

## 2014-02-10 DIAGNOSIS — F32A Depression, unspecified: Secondary | ICD-10-CM | POA: Diagnosis present

## 2014-02-10 DIAGNOSIS — F489 Nonpsychotic mental disorder, unspecified: Secondary | ICD-10-CM | POA: Diagnosis not present

## 2014-02-10 DIAGNOSIS — F419 Anxiety disorder, unspecified: Secondary | ICD-10-CM | POA: Diagnosis present

## 2014-02-10 DIAGNOSIS — F329 Major depressive disorder, single episode, unspecified: Secondary | ICD-10-CM | POA: Diagnosis present

## 2014-02-10 DIAGNOSIS — Z7289 Other problems related to lifestyle: Secondary | ICD-10-CM

## 2014-02-10 DIAGNOSIS — F17200 Nicotine dependence, unspecified, uncomplicated: Secondary | ICD-10-CM | POA: Insufficient documentation

## 2014-02-10 DIAGNOSIS — Z87448 Personal history of other diseases of urinary system: Secondary | ICD-10-CM | POA: Diagnosis not present

## 2014-02-10 DIAGNOSIS — Z791 Long term (current) use of non-steroidal anti-inflammatories (NSAID): Secondary | ICD-10-CM | POA: Diagnosis not present

## 2014-02-10 DIAGNOSIS — I10 Essential (primary) hypertension: Secondary | ICD-10-CM | POA: Insufficient documentation

## 2014-02-10 DIAGNOSIS — F1510 Other stimulant abuse, uncomplicated: Secondary | ICD-10-CM | POA: Diagnosis not present

## 2014-02-10 LAB — COMPREHENSIVE METABOLIC PANEL
ALK PHOS: 100 U/L (ref 39–117)
ALT: 8 U/L (ref 0–35)
AST: 21 U/L (ref 0–37)
Albumin: 3.4 g/dL — ABNORMAL LOW (ref 3.5–5.2)
Anion gap: 11 (ref 5–15)
BUN: 8 mg/dL (ref 6–23)
CO2: 23 meq/L (ref 19–32)
CREATININE: 0.65 mg/dL (ref 0.50–1.10)
Calcium: 9.3 mg/dL (ref 8.4–10.5)
Chloride: 108 mEq/L (ref 96–112)
GFR calc Af Amer: >90 mL/min (ref >90)
Glucose, Bld: 87 mg/dL (ref 70–99)
Potassium: 4.6 mEq/L (ref 3.7–5.3)
Sodium: 142 mEq/L (ref 137–147)
Total Bilirubin: 0.4 mg/dL (ref 0.3–1.2)
Total Protein: 6.8 g/dL (ref 6.0–8.3)

## 2014-02-10 LAB — ETHANOL

## 2014-02-10 LAB — ACETAMINOPHEN LEVEL: Acetaminophen (Tylenol), Serum: <15 ug/mL (ref 10–30)

## 2014-02-10 LAB — RAPID URINE DRUG SCREEN, HOSP PERFORMED
AMPHETAMINES: POSITIVE — AB
BARBITURATES: NOT DETECTED
BENZODIAZEPINES: NOT DETECTED
Cocaine: NOT DETECTED
Opiates: NOT DETECTED
TETRAHYDROCANNABINOL: NOT DETECTED

## 2014-02-10 LAB — CBC
HEMATOCRIT: 36.6 % (ref 36.0–46.0)
Hemoglobin: 12.8 g/dL (ref 12.0–15.0)
MCH: 26.3 pg (ref 26.0–34.0)
MCHC: 35 g/dL (ref 30.0–36.0)
MCV: 75.3 fL — AB (ref 78.0–100.0)
PLATELETS: 423 10*3/uL — AB (ref 150–400)
RBC: 4.86 MIL/uL (ref 3.87–5.11)
RDW: 17.4 % — ABNORMAL HIGH (ref 11.5–15.5)
WBC: 9.5 10*3/uL (ref 4.0–10.5)

## 2014-02-10 LAB — SALICYLATE LEVEL: Salicylate Lvl: <2 mg/dL — ABNORMAL LOW (ref 2.8–20.0)

## 2014-02-10 MED ORDER — AMLODIPINE BESYLATE 5 MG PO TABS
5.0000 mg | ORAL_TABLET | Freq: Every day | ORAL | Status: DC
  Filled 2014-02-10: qty 1

## 2014-02-10 MED ORDER — ACETAMINOPHEN 325 MG PO TABS: 650.0000 mg | ORAL_TABLET | ORAL | Status: DC | PRN

## 2014-02-10 MED ORDER — LORAZEPAM 1 MG PO TABS: 1.0000 mg | ORAL_TABLET | Freq: Three times a day (TID) | ORAL | Status: DC | PRN

## 2014-02-10 MED ORDER — ONDANSETRON HCL 4 MG PO TABS: 4.0000 mg | ORAL_TABLET | Freq: Three times a day (TID) | ORAL | Status: DC | PRN

## 2014-02-10 NOTE — BH Assessment (Addendum)
Request for assessment was called in for a second time as pt is now alert and medically cleared. Reviewed provider notes  Called to speak with RN, and request TA equipment be placed in pt room. Nurse unavailable at this time due being in another pt room.   Receptionist to place cart in room for assessment.   Assessment to commence shortly.   First attempt to connect at 2359 was unsuccessful.   Lear Ng, [facility] Triage Specialist 02/10/2014 11:53 PM

## 2014-02-10 NOTE — ED Notes (Signed)
She was found in her home by her boyfriend, who came home from work to find pt. Had cut her bilat. Legs and admitted to doing it on purpose.  She stops short of stating she is suicidal or wants to die.  She has numerous superficial lacs and scratches at bilat. Ant. Thighs R > L.

## 2014-02-10 NOTE — ED Notes (Signed)
Bed: SH70 Expected date:  Expected time:  Means of arrival:  Comments: Suicide attempt, [employer] present

## 2014-02-10 NOTE — ED Notes (Signed)
She arrives with two [employer] in [place]; and they remain with her.  She denies desire to kill herself, she states she has been "beside myself for about three days now".

## 2014-02-10 NOTE — ED Provider Notes (Signed)
CSN: [id number]     Arrival date & time 02/10/14  1829 History   First MD Initiated Contact with Patient 02/10/14 1853     Chief Complaint  Patient presents with  . Depression     (Consider location/radiation/quality/duration/timing/severity/associated sxs/prior Treatment) HPI Comments: 38 yo female presenting for evaluation of self cutting.  She is somnolent and unable to give a completely clear history . She reports that EMS gave her a shot of something that made her drowsy.  Level V Caveat applies due to altered mental status.  Patient is a 38 y.o. female presenting with mental health disorder.  Mental Health Problem Presenting symptoms: self mutilation   Patient accompanied by:  Law enforcement Onset quality:  Unable to specify Timing:  Unable to specify Progression:  Worsening Context comment:  "I'm tired of feeling this way" Relieved by:  Nothing Worsened by:  Nothing tried Associated symptoms: anhedonia and feelings of worthlessness     Past Medical History  Diagnosis Date  . Hypertension   . Chronic back pain   . Migraine headache   . Stress incontinence   . Sciatica   . Bipolar disorder    Past Surgical History  Procedure Laterality Date  . Fracture surgery    . Back surgery    . Cesarean section    . Gastric bypass    . Ankle surgery    . Lumbar disc surgery    . Lumbar fusion    . Tubal ligation     Family History  Problem Relation Age of Onset  . Diabetes Mother   . Hypertension Mother   . Diabetes Father   . Hypertension Father    History  Substance Use Topics  . Smoking status: Current Every Day Smoker -- 1.00 packs/day    Types: Cigarettes  . Smokeless tobacco: Not on file  . Alcohol Use: No   OB History   Grav Para Term Preterm Abortions TAB SAB Ect Mult Living                 Review of Systems  Unable to perform ROS: Psychiatric disorder  Psychiatric/Behavioral: Positive for self-injury.      Allergies  Ketorolac tromethamine  and Methocarbamol  Home Medications   Prior to Admission medications   Medication Sig Start Date End Date Taking? Authorizing Provider  amitriptyline (ELAVIL) 75 MG tablet Take 75 mg by mouth at bedtime.    Historical Provider, MD  amLODipine (NORVASC) 5 MG tablet Take 5 mg by mouth daily.    Historical Provider, MD  baclofen (LIORESAL) 10 MG tablet Take 10 mg by mouth 3 (three) times daily.     Historical Provider, MD  calcium carbonate (TUMS EX) 750 MG chewable tablet Chew 4 tablets by mouth daily.    Historical Provider, MD  cephALEXin (KEFLEX) 500 MG capsule Take 1 capsule (500 mg total) by mouth 4 (four) times daily. 12/28/13   Evelina Bucy, MD  cetirizine (ZYRTEC) 10 MG tablet Take 10 mg by mouth daily.    Historical Provider, MD  DULoxetine (CYMBALTA) 60 MG capsule Take 60 mg by mouth daily.    Historical Provider, MD  gabapentin (NEURONTIN) 300 MG capsule Take 300 mg by mouth 3 (three) times daily.    Historical Provider, MD  ibuprofen (ADVIL,MOTRIN) 800 MG tablet Take 800 mg by mouth every 8 (eight) hours as needed for moderate pain.    Historical Provider, MD  omeprazole (PRILOSEC) 20 MG capsule Take 20 mg by mouth daily.  Historical Provider, MD  oxyCODONE-acetaminophen (PERCOCET) 5-325 MG per tablet Take 1 tablet by mouth every 6 (six) hours as needed for moderate pain. 12/28/13   Evelina Bucy, MD  pantoprazole (PROTONIX) 40 MG tablet Take 1 tablet (40 mg total) by mouth once. 12/28/13   Evelina Bucy, MD  sucralfate (CARAFATE) 1 G tablet Take 1 tablet (1 g total) by mouth 4 (four) times daily. 12/28/13   Evelina Bucy, MD   BP 128/85  Pulse 95  Temp(Src) 98.6 F (37 C) (Oral)  Resp 17  SpO2 96%  LMP 01/26/2014 Physical Exam  Nursing note and vitals reviewed. Constitutional: She is oriented to person, place, and time. She appears well-developed and well-nourished. No distress.  HENT:  Head: Normocephalic and atraumatic.  Mouth/Throat: Oropharynx is clear and moist.  Eyes:  Conjunctivae are normal. Pupils are equal, round, and reactive to light. No scleral icterus.  Neck: Neck supple.  Cardiovascular: Normal rate, regular rhythm, normal heart sounds and intact distal pulses.   No murmur heard. Pulmonary/Chest: Effort normal and breath sounds normal. No stridor. No respiratory distress. She has no rales.  Abdominal: Soft. Bowel sounds are normal. She exhibits no distension. There is no tenderness. There is no rigidity, no rebound and no guarding.  Musculoskeletal: Normal range of motion.  Neurological: She is alert and oriented to person, place, and time.  Skin: Skin is warm and dry. No rash noted.  Multiple superficial lacerations to bilateral thighs. Also a small group of superficial lacerations to anterior abdominal wall.  Psychiatric: She has a normal mood and affect. Her behavior is normal.    ED Course  Procedures (including critical care time) Labs Review Labs Reviewed  CBC - Abnormal; Notable for the following:    MCV 75.3 (*)    RDW 17.4 (*)    Platelets 423 (*)    All other components within normal limits  COMPREHENSIVE METABOLIC PANEL - Abnormal; Notable for the following:    Albumin 3.4 (*)    All other components within normal limits  SALICYLATE LEVEL - Abnormal; Notable for the following:    Salicylate Lvl <4.6 (*)    All other components within normal limits  URINE RAPID DRUG SCREEN (HOSP PERFORMED) - Abnormal; Notable for the following:    Amphetamines POSITIVE (*)    All other components within normal limits  ACETAMINOPHEN LEVEL  ETHANOL    Imaging Review No results found.   EKG Interpretation None      MDM   Final diagnoses:  Deliberate self-cutting    38 year old female presenting secondary to self-mutilation.  History limited by the patient's drowsiness. However, she states that she is "just tired of feeling this way." She denies outright suicidal ideation. None of her lacerations appear to need repair. Plan lab psych  evaluation and TTS consult.  Labs unremarkable. No signs or symptoms of organic disease. TTS consult pending.  11:24 PM Recheck, pt is alert, remains well appearing.  Cleared for psych eval.    Houston Siren III, MD 02/10/14 2325

## 2014-02-10 NOTE — BH Assessment (Signed)
Attempted to completed assessment. Pt is drowsy. Will complete assessment when pt is alert.

## 2014-02-11 ENCOUNTER — Encounter (HOSPITAL_COMMUNITY): Payer: Self-pay | Admitting: Psychiatry

## 2014-02-11 DIAGNOSIS — F419 Anxiety disorder, unspecified: Secondary | ICD-10-CM | POA: Diagnosis present

## 2014-02-11 DIAGNOSIS — R45851 Suicidal ideations: Secondary | ICD-10-CM | POA: Diagnosis not present

## 2014-02-11 DIAGNOSIS — F319 Bipolar disorder, unspecified: Secondary | ICD-10-CM

## 2014-02-11 DIAGNOSIS — F411 Generalized anxiety disorder: Secondary | ICD-10-CM

## 2014-02-11 MED ORDER — GABAPENTIN 300 MG PO CAPS: 300.0000 mg | ORAL_CAPSULE | Freq: Three times a day (TID) | ORAL | Status: DC

## 2014-02-11 MED ORDER — CALCIUM CARBONATE ANTACID 750 MG PO CHEW: 4.0000 | CHEWABLE_TABLET | Freq: Every day | ORAL | Status: DC

## 2014-02-11 MED ORDER — AMLODIPINE BESYLATE 5 MG PO TABS: 5.0000 mg | ORAL_TABLET | Freq: Every day | ORAL | Status: DC

## 2014-02-11 MED ORDER — HYDROXYZINE HCL 25 MG PO TABS: 25.0000 mg | ORAL_TABLET | Freq: Four times a day (QID) | ORAL | Status: DC | PRN

## 2014-02-11 MED ORDER — BACLOFEN 10 MG PO TABS: 10.0000 mg | ORAL_TABLET | Freq: Three times a day (TID) | ORAL | Status: DC

## 2014-02-11 MED ORDER — CETIRIZINE HCL 10 MG PO TABS: 10.0000 mg | ORAL_TABLET | Freq: Every day | ORAL | Status: DC

## 2014-02-11 MED ORDER — OMEPRAZOLE 20 MG PO CPDR
20.0000 mg | DELAYED_RELEASE_CAPSULE | Freq: Every day | ORAL | Status: DC
Start: 2014-02-11 — End: 2014-02-26

## 2014-02-11 MED ORDER — DULOXETINE HCL 60 MG PO CPEP: 60.0000 mg | ORAL_CAPSULE | Freq: Every day | ORAL | Status: DC

## 2014-02-11 MED ORDER — AMITRIPTYLINE HCL 75 MG PO TABS: 75.0000 mg | ORAL_TABLET | Freq: Every day | ORAL | Status: DC

## 2014-02-11 MED ORDER — SUCRALFATE 1 G PO TABS: 1.0000 g | ORAL_TABLET | Freq: Four times a day (QID) | ORAL | Status: DC

## 2014-02-11 NOTE — ED Notes (Signed)
Charge RN spoke with [facility], plan is for inpatient treatment.

## 2014-02-11 NOTE — Progress Notes (Signed)
Patient is being reviewed by Emilee Hero, and wait listed for [facility]. LCSW left message for [facility] regarding referral.  Lane Hacker, MSW Clinical Social Work:  TTS Dispositions [phone]

## 2014-02-11 NOTE — B[facility] Suicide Risk Assessment (Signed)
Suicide Risk Assessment  Discharge Assessment     Demographic Factors:  Caucasian  Total Time spent with patient: 20 minutes  Psychiatric Specialty Exam:     Blood pressure 124/79, pulse 80, temperature 98.2 F (36.8 C), temperature source Oral, resp. rate 18, last menstrual period 01/26/2014, SpO2 97.00%.There is no weight on file to calculate BMI.  General Appearance: Casual  Eye Contact::  Good  Speech:  Normal Rate  Volume:  Normal  Mood:  Anxious and Depressed  Affect:  Congruent  Thought Process:  Coherent  Orientation:  Full (Time, Place, and Person)  Thought Content:  WDL  Suicidal Thoughts:  No  Homicidal Thoughts:  No  Memory:  Immediate;   Good Recent;   Good Remote;   Good  Judgement:  Good  Insight:  Good  Psychomotor Activity:  Normal  Concentration:  Good  Recall:  Good  Fund of Knowledge:Good  Language: Good  Akathisia:  No  Handed:  Right  AIMS (if indicated):     Assets:  Communication Skills Desire for Improvement Financial Resources/Insurance Housing Intimacy Physical Health Resilience Social Support  Sleep:      Musculoskeletal: Strength & Muscle Tone: within normal limits Gait & Station: normal Patient leans: N/A  Mental Status Per Nursing Assessment::   On Admission:   Anxiety, depression  Current Mental Status by Physician: NA  Loss Factors: NA  Historical Factors: NA  Risk Reduction Factors:   Responsible for children under 68 years of age, Sense of responsibility to family, Living with another person, especially a relative, Positive social support and Positive coping skills or problem solving skills  Continued Clinical Symptoms:  Anxiety, depression  Cognitive Features That Contribute To Risk:  None   Suicide Risk:  Minimal: No identifiable suicidal ideation.  Patients presenting with no risk factors but with morbid ruminations; may be classified as minimal risk based on the severity of the depressive  symptoms  Discharge Diagnoses:   AXIS I:  Anxiety Disorder NOS and Depressive Disorder NOS AXIS II:  Deferred AXIS III:   Past Medical History  Diagnosis Date  . Hypertension   . Chronic back pain   . Migraine headache   . Stress incontinence   . Sciatica   . Bipolar disorder    AXIS IV:  other psychosocial or environmental problems, problems related to social environment and problems with primary support group AXIS V:  61-70 mild symptoms  Plan Of Care/Follow-up recommendations:  Activity:  as tolerated Diet:  low-sodium heart healthy diet  Is patient on multiple antipsychotic therapies at discharge:  No   Has Patient had three or more failed trials of antipsychotic monotherapy by history:  No  Recommended Plan for Multiple Antipsychotic Therapies: NA    Oprah Camarena, PMH-NP 02/11/2014, 12:53 PM

## 2014-02-11 NOTE — ED Notes (Signed)
Patient arrived to unit, sitter at bedside for safety. Pt with noted cuts to bilateral thighs. Pt denies SI or plans to harm herself. No s/s of distress noted. Snack and soda provided to patient, warm blankets given.
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
## 2014-02-11 NOTE — BH Assessment (Signed)
Relayed results of assessment to Gust Rung, NP. Per Roselyn Reef, NP pt meets inpt criteria. There are no beds available at [facility] so TTS should seek placement.   Dr. Florina Ou, EDP is in agreement with plan.   Pt's RN has been informed of plan and will share with pt.   TTS to seek placement.  Lear Ng, [facility] Triage Specialist 02/11/2014 12:32 AM

## 2014-02-11 NOTE — ED Notes (Signed)
Pt can be moved to room 27 after TTS consult

## 2014-02-11 NOTE — Discharge Instructions (Signed)
Depression Depression is feeling sad, low, down in the dumps, blue, gloomy, or empty. In general, there are two kinds of depression:  Normal sadness or grief. This can happen after something upsetting. It often goes away on its own within 2 weeks. After losing a loved one (bereavement), normal sadness and grief may last longer than two weeks. It usually gets better with time.  Clinical depression. This kind lasts longer than normal sadness or grief. It keeps you from doing the things you normally do in life. It is often hard to function at home, work, or at school. It may affect your relationships with others. Treatment is often needed. GET HELP RIGHT AWAY IF:  You have thoughts about hurting yourself or others.  You lose touch with reality (psychotic symptoms). You may:  See or hear things that are not real.  Have untrue beliefs about your life or people around you.  Your medicine is giving you problems. MAKE SURE YOU:  Understand these instructions.  Will watch your condition.  Will get help right away if you are not doing well or get worse. Document Released: 06/12/2010 Document Revised: 09/24/2013 Document Reviewed: 09/09/2011 [employer] Patient Information 2015 [employer]. This information is not intended to replace advice given to you by your health care provider. Make sure you discuss any questions you have with your health care provider.  
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
## 2014-02-26 ENCOUNTER — Encounter (HOSPITAL_COMMUNITY): Payer: Self-pay | Admitting: Emergency Medicine

## 2014-02-26 ENCOUNTER — Emergency Department (INDEPENDENT_AMBULATORY_CARE_PROVIDER_SITE_OTHER)
Admission: EM | Admit: 2014-02-26 | Discharge: 2014-02-26 | Disposition: A | Discharge status: Other Acute Inpatient Hospital | Payer: Medicare Other | Source: Home / Self Care | Attending: Family Medicine | Admitting: Family Medicine

## 2014-02-26 ENCOUNTER — Emergency Department (HOSPITAL_COMMUNITY)
Admission: EM | Admit: 2014-02-26 | Discharge: 2014-02-26 | Disposition: A | Discharge status: Home / Self Care | Payer: Medicare Other | Attending: Emergency Medicine | Admitting: Emergency Medicine

## 2014-02-26 ENCOUNTER — Emergency Department (HOSPITAL_COMMUNITY): Payer: Medicare Other

## 2014-02-26 DIAGNOSIS — G8929 Other chronic pain: Secondary | ICD-10-CM | POA: Insufficient documentation

## 2014-02-26 DIAGNOSIS — M79641 Pain in right hand: Secondary | ICD-10-CM | POA: Diagnosis not present

## 2014-02-26 DIAGNOSIS — Z720 Tobacco use: Secondary | ICD-10-CM | POA: Insufficient documentation

## 2014-02-26 DIAGNOSIS — Z8739 Personal history of other diseases of the musculoskeletal system and connective tissue: Secondary | ICD-10-CM | POA: Diagnosis not present

## 2014-02-26 DIAGNOSIS — G43909 Migraine, unspecified, not intractable, without status migrainosus: Secondary | ICD-10-CM | POA: Insufficient documentation

## 2014-02-26 DIAGNOSIS — I10 Essential (primary) hypertension: Secondary | ICD-10-CM | POA: Diagnosis not present

## 2014-02-26 DIAGNOSIS — Z8742 Personal history of other diseases of the female genital tract: Secondary | ICD-10-CM | POA: Insufficient documentation

## 2014-02-26 DIAGNOSIS — F319 Bipolar disorder, unspecified: Secondary | ICD-10-CM | POA: Diagnosis not present

## 2014-02-26 DIAGNOSIS — R600 Localized edema: Secondary | ICD-10-CM

## 2014-02-26 LAB — CBC
HCT: 34.3 % — ABNORMAL LOW (ref 36.0–46.0)
Hemoglobin: 11.9 g/dL — ABNORMAL LOW (ref 12.0–15.0)
MCH: 26 pg (ref 26.0–34.0)
MCHC: 34.7 g/dL (ref 30.0–36.0)
MCV: 75.1 fL — ABNORMAL LOW (ref 78.0–100.0)
Platelets: 253 10*3/uL (ref 150–400)
RBC: 4.57 MIL/uL (ref 3.87–5.11)
RDW: 17.3 % — ABNORMAL HIGH (ref 11.5–15.5)
WBC: 7.5 10*3/uL (ref 4.0–10.5)

## 2014-02-26 LAB — BASIC METABOLIC PANEL
Anion gap: 10 (ref 5–15)
BUN: 7 mg/dL (ref 6–23)
CO2: 25 mEq/L (ref 19–32)
Calcium: 9.1 mg/dL (ref 8.4–10.5)
Chloride: 106 mEq/L (ref 96–112)
Creatinine, Ser: 0.58 mg/dL (ref 0.50–1.10)
GFR calc Af Amer: >90 mL/min (ref >90)
GFR calc non Af Amer: >90 mL/min (ref >90)
Glucose, Bld: 90 mg/dL (ref 70–99)
Potassium: 4.1 mEq/L (ref 3.7–5.3)
Sodium: 141 mEq/L (ref 137–147)

## 2014-02-26 LAB — C-REACTIVE PROTEIN: CRP: 1.6 mg/dL — ABNORMAL HIGH (ref <0.60)

## 2014-02-26 LAB — SEDIMENTATION RATE: Sed Rate: 20 mm/hr (ref 0–22)

## 2014-02-26 MED ORDER — HYDROMORPHONE HCL 1 MG/ML IJ SOLN
1.0000 mg | Freq: Once | INTRAMUSCULAR | Status: AC
  Administered 2014-02-26: 1 mg via INTRAVENOUS
  Filled 2014-02-26: qty 1

## 2014-02-26 MED ORDER — FENTANYL CITRATE 0.05 MG/ML IJ SOLN
50.0000 ug | Freq: Once | INTRAMUSCULAR | Status: AC
  Administered 2014-02-26: 50 ug via INTRAVENOUS
  Filled 2014-02-26: qty 2

## 2014-02-26 NOTE — ED Notes (Signed)
Pt  States    Pain r  Hand       With   Swelling      Pt  States  Had  Iv  Paced   At  That  Time       Subsequently  She  Saw     Her  pcp          She  Reports  Pain and  Numbness     And  Swelling  To  The  r  Hand       Pt  Reports  Pain is  Worse

## 2014-02-26 NOTE — ED Notes (Signed)
Patient transported to Ultrasound 

## 2014-02-26 NOTE — ED Notes (Signed)
Pt refused d/c vital signs 

## 2014-02-26 NOTE — ED Notes (Signed)
Family at bedside. 

## 2014-02-26 NOTE — ED Provider Notes (Signed)
CSN: [id number]     Arrival date & time 02/26/14  6433 History   First MD Initiated Contact with Patient 02/26/14 [phone]     Chief Complaint  Patient presents with  . Hand Pain     (Consider location/radiation/quality/duration/timing/severity/associated sxs/prior Treatment) HPI Pt is a 38yo female presenting to ED from urgent care for further evaluation and treatment of right hand pain that has gradually worsening since 9/28 when pt had an IV placed for dental pain.  Pt states over last 2-3 days she has noticed increased swelling and severe sharp shooting pain from right mid-forearm down to the tips of her fingers.  Pain is 10/10, worse with light touch and any type of movement. Pt is right hand dominant. Denies fever, n/v/d. Pt does report being seen by her PCP last week and was advised it was likely just an irritated nerve but symptoms have only worsened since onset. Denies previous injury or surgery to hand. No hx of gout. Last meal was last night, pt only drank coffee this morning.   Past Medical History  Diagnosis Date  . Hypertension   . Chronic back pain   . Migraine headache   . Stress incontinence   . Sciatica   . Bipolar disorder    Past Surgical History  Procedure Laterality Date  . Fracture surgery    . Back surgery    . Cesarean section    . Gastric bypass    . Ankle surgery    . Lumbar disc surgery    . Lumbar fusion    . Tubal ligation     Family History  Problem Relation Age of Onset  . Diabetes Mother   . Hypertension Mother   . Diabetes Father   . Hypertension Father    History  Substance Use Topics  . Smoking status: Current Every Day Smoker -- 1.00 packs/day    Types: Cigarettes  . Smokeless tobacco: Not on file  . Alcohol Use: No   OB History   Grav Para Term Preterm Abortions TAB SAB Ect Mult Living                 Review of Systems  Constitutional: Negative for fever and chills.  Respiratory: Negative for cough and shortness of breath.     Gastrointestinal: Negative for nausea, vomiting and abdominal pain.  Musculoskeletal: Positive for arthralgias, joint swelling and myalgias.       Right hand swelling and pain  Skin: Negative for color change, pallor, rash and wound.  All other systems reviewed and are negative.     Allergies  Ketorolac tromethamine and Methocarbamol  Home Medications   Prior to Admission medications   Medication Sig Start Date End Date Taking? Authorizing Provider  amitriptyline (ELAVIL) 75 MG tablet Take 1 tablet (75 mg total) by mouth at bedtime. 02/11/14  Yes Waylan Boga, NP  amLODipine (NORVASC) 5 MG tablet Take 1 tablet (5 mg total) by mouth daily. 02/11/14  Yes Waylan Boga, NP  baclofen (LIORESAL) 10 MG tablet Take 1 tablet (10 mg total) by mouth 3 (three) times daily. 02/11/14  Yes Waylan Boga, NP  calcium carbonate (TUMS EX) 750 MG chewable tablet Chew 4 tablets (3,000 mg total) by mouth daily. 02/11/14  Yes Waylan Boga, NP  cetirizine (ZYRTEC) 10 MG tablet Take 1 tablet (10 mg total) by mouth daily. 02/11/14  Yes Waylan Boga, NP  DULoxetine (CYMBALTA) 60 MG capsule Take 1 capsule (60 mg total) by mouth daily. 02/11/14  Yes  Waylan Boga, NP  gabapentin (NEURONTIN) 300 MG capsule Take 1 capsule (300 mg total) by mouth 3 (three) times daily. 02/11/14  Yes Waylan Boga, NP  hydrOXYzine (ATARAX/VISTARIL) 25 MG tablet Take 1 tablet (25 mg total) by mouth every 6 (six) hours as needed for anxiety. 02/11/14  Yes Waylan Boga, NP  Multiple Vitamins-Minerals (MULTIVITAMIN PO) Take 1 tablet by mouth daily.   Yes Historical Provider, MD  sucralfate (CARAFATE) 1 G tablet Take 1 tablet (1 g total) by mouth 4 (four) times daily. 02/11/14  Yes Waylan Boga, NP   BP 147/90  Pulse 82  Temp(Src) 98.5 F (36.9 C) (Oral)  Resp 20  Ht 5' 9.5" (1.765 m)  Wt 250 lb (113.399 kg)  BMI 36.40 kg/m2  SpO2 98%  LMP 02/15/2014 Physical Exam  Nursing note and vitals reviewed. Constitutional: She is oriented to person,  place, and time. She appears well-developed and well-nourished.  HENT:  Head: Normocephalic and atraumatic.  Eyes: EOM are normal.  Neck: Normal range of motion.  Cardiovascular: Normal rate.   Pulmonary/Chest: Effort normal.  Musculoskeletal: She exhibits edema and tenderness.  Right hand: moderate edema w/o erythema or warmth. exquisite tenderness to light touch circumferentially of digits of right hand, hand, and 2in of distal forearm. FROM right elbow, wrist flexion and extension limited to 20 degrees due to pain. Unable to make fist due to pain. Able to oppose thumb to index and middle finger but not ring or little finger due to pain.   Neurological: She is alert and oriented to person, place, and time.  Altered sensation to right hand with light touch.  Skin: Skin is warm and dry. No erythema.  Small cross tattoo on dorsal aspect right middle finger.  1cm circular area of erythema and dried skin, c/w old abrasion/scab. No ecchymosis, warmth, or large area of erythema c/o cellulitis.  Psychiatric: She has a normal mood and affect. Her behavior is normal.         ED Course  Procedures (including critical care time) Labs Review Labs Reviewed  CBC - Abnormal; Notable for the following:    Hemoglobin 11.9 (*)    HCT 34.3 (*)    MCV 75.1 (*)    RDW 17.3 (*)    All other components within normal limits  BASIC METABOLIC PANEL  SEDIMENTATION RATE  C-REACTIVE PROTEIN    Imaging Review Dg Hand Complete Right  02/26/2014   CLINICAL DATA:  Swelling right hand after IV placement. Initial visit.  EXAM: RIGHT HAND - COMPLETE 3+ VIEW  COMPARISON:  None.  FINDINGS: A corticated lytic lesion is noted in the metaphysis of the distal phalanx of the right thumb. This may have calcified chondroid matrix. 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 Inclusion cyst from prior trauma could present in this fashion. No soft tissue swelling noted to suggest infection. Other more ominous lesions such  as metastatic foci would be less likely. The patient has no prior history of tumor. Follow-up imaging in 3 months to demonstrate stability suggested.  IMPRESSION: Corticated lytic lesion in the metaphysis of the distal phalanx of the right thumb noted. 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 Follow-up imaging in 3 months to demonstrate stability suggested. No acute abnormality identified.   Electronically Signed   By: Marcello Moores  Register   On: 02/26/2014 10:51     EKG Interpretation None      MDM   Final diagnoses:  Right hand pain   Consulted with Lahoma Rocker, PA,  at [facility] who stated Dr. Amedeo Plenty, hand surgery, is aware of pt and is to be called by ED after initial workup if needed.  Upon arrival to ED, pt became irritated this PA was not the hand surgeon as she was under expectation "a doctor was waiting on her."   Pt is a 38yo female c/o gradually worsening right hand pain and swelling after an IV was placed on 9/28 for dental pain.  Pain appears to be out of proportion to appearance of hand.  DDx: tenosynovitis, compartment syndrome, neuropathy, carpal tunnel. No evidence of large area of cellulitis or abscess.  Basic labs and right hand plain film ordered.  Pt is afebrile.  Given 1mg  IV dilaudid that brought pain from 10/10 to 9/10. Pain still severe with minimal movement or touch.   10:55 AM Pt is sleeping at this time.  Plain films Right Hand: No acute abnormality identified. Corticated lytic lesion in the metaphysis of distal phalanx of right thumb noted. 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 F/u imaging in 3 months to demonstrate stability suggested.      11:05 AM Consulted with Network engineer for Dr. Amedeo Plenty, hand surgery, who will page Dr. Amedeo Plenty to call me back as pt is under expectation she will be seen by hand surgeon today.  Do not believe this is unreasonable as pt's pain appears to be out of proportion to exam.    11:28 AM Consulted with Dr. Amedeo Plenty,  recommended getting a sedimentation rate at C-reactive protein.   11:36 AM Pt tearful, asking for more pain medication. Second dose of dilaudid ordered.  Dr. Amedeo Plenty requested U/S right hand to r/o deep space infection vs DVT   1:04 PM Pt states she does not want to wait any longer, understands she will be signing out AMA. 1:09 PM when [facility] papers printed and presented by RN, pt stated she decides to stay.   1:59 PM pt is "next in line" for venous doppler.  Pt requesting more pain medication. Fentanyl ordered.   3:24 PM Vascular Ultrasound Right upper extremity venous duplex has been completed. Preliminary findings: No evidence of deep or superficial thrombosis in the RUE. Cannot rule out infection with ultrasound. Interstitial fluid noted around right hand.  Sed Rate: WNL.  CRP- blood sample needed to be redrawn, pt refused.  -Will consult with Dr. Amedeo Plenty again as pt is still requesting pain medication. Ice pack applied to hand.   3:51 PM Consulted with Dr. Amedeo Plenty who stated he is on his way to see pt.  Discussed with pt importance of her staying for formal evaluation by Dr. Amedeo Plenty.  Pt agrees to stay until 4:25PM at the latest as her ride will be gone by 5PM.    4:25 PM Pt evaluated by Dr. Amedeo Plenty, no concern for compartment syndrome, abscess, or underlying infection.  Pain likely result of pt's hx of chronic pain and neuropathy.  Pt may be discharged home to f/u with her PCP, Dr. Ricke Hey.      Noland Fordyce, PA-C 02/26/14 [phone]

## 2014-02-26 NOTE — ED Notes (Signed)
Dr. Amedeo Plenty returned PA page, PA in with pt to give update.

## 2014-02-26 NOTE — ED Notes (Signed)
Pt refused to have C reactive protein to be drawn by lab.  Erin PA aware.

## 2014-02-26 NOTE — ED Notes (Signed)
Lab to add on sed rate, will  Need to draw c reactive protein

## 2014-02-26 NOTE — ED Notes (Signed)
Patient was given a ice pack for right hand.

## 2014-02-26 NOTE — ED Notes (Signed)
Verified with lab that [facility] for c reactive protein was drawn.  Lab tech clicked off lab.  Lab unable to find [facility], needs re-drawn.

## 2014-02-26 NOTE — Consult Note (Signed)
Reason for Consult: Right hand pain Referring Physician: Schelly Chuba is an 37 y.o. female.  HPI: Patient presents for evaluation of her hand.  Patient states she was going to see her primary care physician Dr. Alyson Ingles but was unable to get an appointment today. She presented to the urgent care where a concern of swelling in her hand was brought up. Due to this she was transferred to [facility] ED. Her workup consisted of laboratory announces as well as ultrasound. At present time she is awake alert and oriented. She states she had a dental procedure performed a month ago. After this procedure she had pain in her hand and is continued.  She is currently on gabapentin do to other issues and is on chronic pain medicine in the form of Lortab but does not have any today.  The patient notes pain in her hand. She is here with her significant other. She denies neck back chest or abdominal pain. She denies vascular abnormalities or radicular or myelopathic issues.  Past Medical History  Diagnosis Date  . Hypertension   . Chronic back pain   . Migraine headache   . Stress incontinence   . Sciatica   . Bipolar disorder     Past Surgical History  Procedure Laterality Date  . Fracture surgery    . Back surgery    . Cesarean section    . Gastric bypass    . Ankle surgery    . Lumbar disc surgery    . Lumbar fusion    . Tubal ligation      Family History  Problem Relation Age of Onset  . Diabetes Mother   . Hypertension Mother   . Diabetes Father   . Hypertension Father     Social History:  reports that she has been smoking Cigarettes.  She has been smoking about 1.00 pack per day. She does not have any smokeless tobacco history on file. She reports that she does not drink alcohol or use illicit drugs.  Allergies:  Allergies  Allergen Reactions  . Ketorolac Tromethamine Itching and Nausea And Vomiting  . Methocarbamol Diarrhea    Medications: I have reviewed the  patient's current medications.  Results for orders placed during the hospital encounter of 02/26/14 (from the past 48 hour(s))  CBC     Status: Abnormal   Collection Time    02/26/14  9:58 AM      Result Value Ref Range   WBC 7.5  4.0 - 10.5 K/uL   RBC 4.57  3.87 - 5.11 MIL/uL   Hemoglobin 11.9 (*) 12.0 - 15.0 g/dL   HCT 34.3 (*) 36.0 - 46.0 %   MCV 75.1 (*) 78.0 - 100.0 fL   MCH 26.0  26.0 - 34.0 pg   MCHC 34.7  30.0 - 36.0 g/dL   RDW 17.3 (*) 11.5 - 15.5 %   Platelets 253  150 - 400 K/uL  BASIC METABOLIC PANEL     Status: None   Collection Time    02/26/14  9:58 AM      Result Value Ref Range   Sodium 141  137 - 147 mEq/L   Potassium 4.1  3.7 - 5.3 mEq/L   Chloride 106  96 - 112 mEq/L   CO2 25  19 - 32 mEq/L   Glucose, Bld 90  70 - 99 mg/dL   BUN 7  6 - 23 mg/dL   Creatinine, Ser 0.58  0.50 - 1.10 mg/dL  Calcium 9.1  8.4 - 10.5 mg/dL   GFR calc non Af Amer >90  >90 mL/min   GFR calc Af Amer >90  >90 mL/min   Comment: (NOTE)     The eGFR has been calculated using the CKD EPI equation.     This calculation has not been validated in all clinical situations.     eGFR's persistently <90 mL/min signify possible Chronic Kidney     Disease.   Anion gap 10  5 - 15  SEDIMENTATION RATE     Status: None   Collection Time    02/26/14  9:58 AM      Result Value Ref Range   Sed Rate 20  0 - 22 mm/hr    Dg Hand Complete Right  02/26/2014   CLINICAL DATA:  Swelling right hand after IV placement. Initial visit.  EXAM: RIGHT HAND - COMPLETE 3+ VIEW  COMPARISON:  None.  FINDINGS: A corticated lytic lesion is noted in the metaphysis of the distal phalanx of the right thumb. This may have calcified chondroid matrix. 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 Inclusion cyst from prior trauma could present in this fashion. No soft tissue swelling noted to suggest infection. Other more ominous lesions such as metastatic foci would be less likely. The patient has no prior history of tumor.  Follow-up imaging in 3 months to demonstrate stability suggested.  IMPRESSION: Corticated lytic lesion in the metaphysis of the distal phalanx of the right thumb noted. 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 Follow-up imaging in 3 months to demonstrate stability suggested. No acute abnormality identified.   Electronically Signed   By: Marcello Moores  Register   On: 02/26/2014 10:51    ROS Blood pressure 147/90, pulse 82, temperature 98.5 F (36.9 C), temperature source Oral, resp. rate 20, height 5' 9.5" (1.765 m), weight 113.399 kg (250 lb), last menstrual period 02/15/2014, SpO2 98.00%. Physical Exam white female obese  Hand has normal refill normal sensation to the tips. Wrist and finger joints are nontender. There is no evidence of infection. There is no evidence of gross instability.  She has intact flexion and extension of the fingers and wrist. Compartments are soft. She has normal pulse and refill as mentioned. I do not see any obvious abscess or cellulitis or infectious problem. I do not see a compartment syndrome. Her wrist is stable. Her x-rays are negative. There is a question of a small area about the distal phalanx of her thumb likely and chondroma. I feel this is benign and is not related to recurrent predicament. She has some irritability in the superficial radial nerve distribution after IV access from her dental procedure.  Thenar hyperthenar and mid palmar all soft.  I reviewed all issues with her.  At present time her abdomen is nontender chest is clear HEENT is within normal limits. I reviewed her labs as well as her workup.  Assessment/Plan: Left hand pain x4-6 weeks after IV access from a dental procedure.  I discussed the patient I do not find any surgical issues. She does not have been sectioned compartment syndrome bone or tendon abnormalities.  I question whether this is a issue of nerve irritability. I would consider continuing gabapentin. I would consider therapy  to [facility] by a hand therapist.  If her pain continued one could certainly refer to pain management for consideration of stellate ganglion blocks  At present time she looks quite good in terms of the hand and does have pain out of  proportion to her exam to a certain extent. This may be due to her chronic pain issues or maybe do to a complex regional pain syndrome in evolution. I feel this be best managed by pain management.  I discussed her all issues in detail.  She'll be given a wrist brace and we'll plan for outpatient followup with her physician.  Paulene Floor 02/26/2014, 4:33 PM

## 2014-02-26 NOTE — ED Notes (Signed)
PA aware pt back from doppler and pain scale. No orders given.

## 2014-02-26 NOTE — Progress Notes (Signed)
Orthopedic Tech Progress Note Patient Details:  MARTIN SMEAL 1975/08/17 [id number]  Ortho Devices Type of Ortho Device: Velcro wrist splint Ortho Device/Splint Location: RUE Ortho Device/Splint Interventions: Ordered;Application   Braulio Bosch 02/26/2014, 4:30 PM

## 2014-02-26 NOTE — ED Notes (Signed)
Patient being transported to x-ray.

## 2014-02-26 NOTE — Progress Notes (Signed)
*  PRELIMINARY RESULTS* Vascular Ultrasound Right upper extremity venous duplex has been completed.  Preliminary findings: No evidence of deep or superficial thrombosis in the RUE. Cannot rule out infection with ultrasound. Interstitial fluid noted around right hand.  Landry Mellow, RDMS, RVT  02/26/2014, 2:59 PM

## 2014-02-26 NOTE — ED Notes (Signed)
Pt c/o pain.  Per Erin PA, ice pack to right wrist, no narcotics at this time.  Waiting on Dr. Amedeo Plenty to return page.

## 2014-02-26 NOTE — ED Notes (Signed)
Pt sent from [facility] for further eval of right hand pain and swelling after having PIV on 9/28

## 2014-02-26 NOTE — ED Notes (Signed)
PA at bedside.

## 2014-02-26 NOTE — ED Provider Notes (Signed)
CSN: [id number]     Arrival date & time 02/26/14  0801 History   First MD Initiated Contact with Patient 02/26/14 [phone]     Chief Complaint  Patient presents with  . Edema   (Consider location/radiation/quality/duration/timing/severity/associated sxs/prior Treatment) HPI Comments: Patient presents with right hand pain, swelling and numbness. States her symptoms began 02/19/2014. States she underwent an tooth extraction performed by an Chief Financial Officer in [name], [place]. States she has had development and progression of her symptoms since IV was placed at right hand for dental procedure. Has not yet contacted the oral surgery office to let them know of her situation. States she was seen by her PCP (Dr. Alyson Ingles) a few days after symptoms began and was advised to ice, elevate, use ibuprofen and compression, all of which she has been doing at home with little relief. Became concerned when she found her fingers to be numb this morning and presents for evaluation.   The history is provided by the patient.    Past Medical History  Diagnosis Date  . Hypertension   . Chronic back pain   . Migraine headache   . Stress incontinence   . Sciatica   . Bipolar disorder    Past Surgical History  Procedure Laterality Date  . Fracture surgery    . Back surgery    . Cesarean section    . Gastric bypass    . Ankle surgery    . Lumbar disc surgery    . Lumbar fusion    . Tubal ligation     Family History  Problem Relation Age of Onset  . Diabetes Mother   . Hypertension Mother   . Diabetes Father   . Hypertension Father    History  Substance Use Topics  . Smoking status: Current Every Day Smoker -- 1.00 packs/day    Types: Cigarettes  . Smokeless tobacco: Not on file  . Alcohol Use: No   OB History   Grav Para Term Preterm Abortions TAB SAB Ect Mult Living                 Review of Systems  All other systems reviewed and are negative.   Allergies  Ketorolac tromethamine and  Methocarbamol  Home Medications   Prior to Admission medications   Medication Sig Start Date End Date Taking? Authorizing Provider  amitriptyline (ELAVIL) 75 MG tablet Take 1 tablet (75 mg total) by mouth at bedtime. 02/11/14   Waylan Boga, NP  amLODipine (NORVASC) 5 MG tablet Take 1 tablet (5 mg total) by mouth daily. 02/11/14   Waylan Boga, NP  baclofen (LIORESAL) 10 MG tablet Take 1 tablet (10 mg total) by mouth 3 (three) times daily. 02/11/14   Waylan Boga, NP  calcium carbonate (TUMS EX) 750 MG chewable tablet Chew 4 tablets (3,000 mg total) by mouth daily. 02/11/14   Waylan Boga, NP  cetirizine (ZYRTEC) 10 MG tablet Take 1 tablet (10 mg total) by mouth daily. 02/11/14   Waylan Boga, NP  DULoxetine (CYMBALTA) 60 MG capsule Take 1 capsule (60 mg total) by mouth daily. 02/11/14   Waylan Boga, NP  gabapentin (NEURONTIN) 300 MG capsule Take 1 capsule (300 mg total) by mouth 3 (three) times daily. 02/11/14   Waylan Boga, NP  hydrOXYzine (ATARAX/VISTARIL) 25 MG tablet Take 1 tablet (25 mg total) by mouth every 6 (six) hours as needed for anxiety. 02/11/14   Waylan Boga, NP  ibuprofen (ADVIL,MOTRIN) 800 MG tablet Take 800 mg by mouth  every 8 (eight) hours as needed for moderate pain.    Historical Provider, MD  omeprazole (PRILOSEC) 20 MG capsule Take 1 capsule (20 mg total) by mouth daily. 02/11/14   Waylan Boga, NP  sucralfate (CARAFATE) 1 G tablet Take 1 tablet (1 g total) by mouth 4 (four) times daily. 02/11/14   Waylan Boga, NP   BP 126/91  Pulse 90  Temp(Src) 97.9 F (36.6 C) (Oral)  Resp 16  SpO2 100%  LMP 02/15/2014 Physical Exam  Nursing note and vitals reviewed. Constitutional: She is oriented to person, place, and time. She appears well-developed and well-nourished. No distress.  +obese  HENT:  Head: Normocephalic and atraumatic.  Cardiovascular: Normal rate.   Pulmonary/Chest: Effort normal.  Musculoskeletal:       Right hand: She exhibits decreased range of motion,  tenderness, decreased capillary refill and swelling. She exhibits no bony tenderness, no deformity and no laceration. Decreased sensation noted. Decreased sensation is present in the medial distribution. Decreased strength noted.  ROM and strength testing limited by pain. Reports hand to be too painful to be touched or moved. States all 5 fingers are numb, but numbness is most pronounced at thumb, index and middle finger. Soft tissues of hand are not erythematous, indurated or fluctuant. Moderate generalized swelling of right hand. Reports pain extends to right forearm. Cap refill about 4-5 seconds.   Neurological: She is alert and oriented to person, place, and time.  Skin: Skin is warm and dry.  Psychiatric: She has a normal mood and affect. Her behavior is normal.    ED Course  Procedures (including critical care time) Labs Review Labs Reviewed - No data to display  Imaging Review No results found.   MDM   1. Right hand pain    Case reviewed with Dr. Juventino Slovak as portions of exam suggestive of compartment syndrome. Discussed by phone with hand orthopedist (Dr. Amedeo Plenty) who agrees with patient transfer to ER for evaluation. Will transfer by shuttle to [facility] ER.     Lutricia Feil, PA 02/26/14 1024

## 2014-02-26 NOTE — ED Notes (Signed)
Dr Gramig at bedside. 

## 2014-02-26 NOTE — ED Notes (Signed)
Pt called nurse to room, ice pack not effective, pt has to leave by 5p in order to be picked up at [facility].  Pt states she can schedule appt with MD for tomorrow.  Erin PA aware.

## 2014-02-26 NOTE — ED Provider Notes (Signed)
Medical screening examination/treatment/procedure(s) were performed by resident physician or non-physician practitioner and as supervising physician I was immediately available for consultation/collaboration.   Pauline Good MD.   Billy Fischer, MD 02/26/14 1136

## 2014-02-26 NOTE — ED Notes (Signed)
Patient returned from xray, and placed back on monitor.

## 2014-02-26 NOTE — ED Notes (Signed)
Pt called nurse to room, pt upset with waiting and process.  U/c told pt Dr. Amedeo Plenty would be waiting on her when she got to ED @ 9:30a.    Wants to leave AMA.  Erin PA aware, discharge orders obtained.  Nurse went back to room and pt now wants to stay for u/s.  Erin PA updated.

## 2014-03-05 NOTE — ED Provider Notes (Addendum)
Medical screening examination/treatment/procedure(s) were conducted as a shared visit with non-physician practitioner(s) and myself.  I personally evaluated the patient during the encounter.   EKG Interpretation None       Charlesetta Shanks, MD 03/05/14 0129 I have examined the patient for complaint of hand pain reportedly after an IV placement. By physical examination find no evidence of acute infectious etiology. No significant abnormality identified on examination except subjective pain by the patient. The hand is photo documented on the chart. Hand surgery evaluated the patient in the emergency department. Disposition plan was made by the hand surgeon.  Charlesetta Shanks, MD 04/13/14 [phone]

## 2014-03-20 ENCOUNTER — Emergency Department (INDEPENDENT_AMBULATORY_CARE_PROVIDER_SITE_OTHER)
Admission: EM | Admit: 2014-03-20 | Discharge: 2014-03-20 | Disposition: A | Discharge status: Home / Self Care | Payer: Medicare Other | Source: Home / Self Care | Attending: Emergency Medicine | Admitting: Emergency Medicine

## 2014-03-20 ENCOUNTER — Encounter (HOSPITAL_COMMUNITY): Payer: Self-pay | Admitting: Emergency Medicine

## 2014-03-20 DIAGNOSIS — R202 Paresthesia of skin: Secondary | ICD-10-CM | POA: Diagnosis not present

## 2014-03-20 DIAGNOSIS — M79641 Pain in right hand: Secondary | ICD-10-CM | POA: Diagnosis not present

## 2014-03-20 DIAGNOSIS — M7989 Other specified soft tissue disorders: Secondary | ICD-10-CM

## 2014-03-20 NOTE — Discharge Instructions (Signed)
You need to stop wearing the brace on your wrist, I believe that is the cause of your hand pain, swelling, and numbness.  You may follow-up with Dr. Amedeo Plenty if the pain is not getting significantly better in a few days out of the brace.       Cryotherapy Cryotherapy means treatment with cold. Ice or gel packs can be used to reduce both pain and swelling. Ice is the most helpful within the first 24 to 48 hours after an injury or flare-up from overusing a muscle or joint. Sprains, strains, spasms, burning pain, shooting pain, and aches can all be eased with ice. Ice can also be used when recovering from surgery. Ice is effective, has very few side effects, and is safe for most people to use. PRECAUTIONS  Ice is not a safe treatment option for people with:  Raynaud phenomenon. This is a condition affecting small blood vessels in the extremities. Exposure to cold may cause your problems to return.  Cold hypersensitivity. There are many forms of cold hypersensitivity, including:  Cold urticaria. Red, itchy hives appear on the skin when the tissues begin to warm after being iced.  Cold erythema. This is a red, itchy rash caused by exposure to cold.  Cold hemoglobinuria. Red blood cells break down when the tissues begin to warm after being iced. The hemoglobin that carry oxygen are passed into the urine because they cannot combine with blood proteins fast enough.  Numbness or altered sensitivity in the area being iced. If you have any of the following conditions, do not use ice until you have discussed cryotherapy with your caregiver:  Heart conditions, such as arrhythmia, angina, or chronic heart disease.  High blood pressure.  Healing wounds or open skin in the area being iced.  Current infections.  Rheumatoid arthritis.  Poor circulation.  Diabetes. Ice slows the blood flow in the region it is applied. This is beneficial when trying to stop inflamed tissues from spreading irritating  chemicals to surrounding tissues. However, if you expose your skin to cold temperatures for too long or without the proper protection, you can damage your skin or nerves. Watch for signs of skin damage due to cold. HOME CARE INSTRUCTIONS Follow these tips to use ice and cold packs safely.  Place a dry or damp towel between the ice and skin. A damp towel will cool the skin more quickly, so you may need to shorten the time that the ice is used.  For a more rapid response, add gentle compression to the ice.  Ice for no more than 10 to 20 minutes at a time. The bonier the area you are icing, the less time it will take to get the benefits of ice.  Check your skin after 5 minutes to make sure there are no signs of a poor response to cold or skin damage.  Rest 20 minutes or more between uses.  Once your skin is numb, you can end your treatment. You can test numbness by very lightly touching your skin. The touch should be so light that you do not see the skin dimple from the pressure of your fingertip. When using ice, most people will feel these normal sensations in this order: cold, burning, aching, and numbness.  Do not use ice on someone who cannot communicate their responses to pain, such as small children or people with dementia. HOW TO MAKE AN ICE PACK Ice packs are the most common way to use ice therapy. Other methods include  ice massage, ice baths, and cryosprays. Muscle creams that cause a cold, tingly feeling do not offer the same benefits that ice offers and should not be used as a substitute unless recommended by your caregiver. To make an ice pack, do one of the following:  Place crushed ice or a bag of frozen vegetables in a sealable plastic bag. Squeeze out the excess air. Place this bag inside another plastic bag. Slide the bag into a pillowcase or place a damp towel between your skin and the bag.  Mix 3 parts water with 1 part rubbing alcohol. Freeze the mixture in a sealable plastic  bag. When you remove the mixture from the freezer, it will be slushy. Squeeze out the excess air. Place this bag inside another plastic bag. Slide the bag into a pillowcase or place a damp towel between your skin and the bag. SEEK MEDICAL CARE IF:  You develop white spots on your skin. This may give the skin a blotchy (mottled) appearance.  Your skin turns blue or pale.  Your skin becomes waxy or hard.  Your swelling gets worse. MAKE SURE YOU:   Understand these instructions.  Will watch your condition.  Will get help right away if you are not doing well or get worse. Document Released: 01/04/2011 Document Revised: 09/24/2013 Document Reviewed: 01/04/2011 [facility] Patient Information 2015 [place], [place]. This information is not intended to replace advice given to you by your health care provider. Make sure you discuss any questions you have with your health care provider.

## 2014-03-20 NOTE — ED Notes (Signed)
Pt  Reports  Continued  Pain r hand   And  r  Arm           She has  Swelling   And has   A  Wrist  Brace  In place         She  States  Has had   Problems with  The  Affected arm    Since  Aug           Pt  Has  Been      Receiving meds from  PCP

## 2014-03-20 NOTE — ED Provider Notes (Signed)
CSN: [id number]     Arrival date & time 03/20/14  0802 History   First MD Initiated Contact with Patient 03/20/14 0815     Chief Complaint  Patient presents with  . Hand Pain   (Consider location/radiation/quality/duration/timing/severity/associated sxs/prior Treatment) HPI      26f presents for eval of continues right hand pain, numbness, swelling.  She was originally seen here on 10/6 for eval of same complaint apparently resulting from PIV for dental procedure in August.  She was transferred to ED with concern for compartment syndrome, was seen and evaluated by hand surgery.  She had XR, ultrasound, labs, all normal.  She was told this is her chronic pain/neuropathy.  She was given a wrist brace.  Since then pain has gotten worse and now radiates all the way up to the shoulder.  Still has pain and swelling of hand.  Has been wearing brace not stop since ED visit.    Past Medical History  Diagnosis Date  . Hypertension   . Chronic back pain   . Migraine headache   . Stress incontinence   . Sciatica   . Bipolar disorder    Past Surgical History  Procedure Laterality Date  . Fracture surgery    . Back surgery    . Cesarean section    . Gastric bypass    . Ankle surgery    . Lumbar disc surgery    . Lumbar fusion    . Tubal ligation     Family History  Problem Relation Age of Onset  . Diabetes Mother   . Hypertension Mother   . Diabetes Father   . Hypertension Father    History  Substance Use Topics  . Smoking status: Current Every Day Smoker -- 1.00 packs/day    Types: Cigarettes  . Smokeless tobacco: Not on file  . Alcohol Use: No   OB History   Grav Para Term Preterm Abortions TAB SAB Ect Mult Living                 Review of Systems  Musculoskeletal: Positive for arthralgias.       Right hand pain, numbness, swelling  Neurological: Positive for numbness. Negative for weakness.  All other systems reviewed and are negative.   Allergies  Ketorolac  tromethamine and Methocarbamol  Home Medications   Prior to Admission medications   Medication Sig Start Date End Date Taking? Authorizing Provider  amitriptyline (ELAVIL) 75 MG tablet Take 1 tablet (75 mg total) by mouth at bedtime. 02/11/14   Waylan Boga, NP  amLODipine (NORVASC) 5 MG tablet Take 1 tablet (5 mg total) by mouth daily. 02/11/14   Waylan Boga, NP  baclofen (LIORESAL) 10 MG tablet Take 1 tablet (10 mg total) by mouth 3 (three) times daily. 02/11/14   Waylan Boga, NP  calcium carbonate (TUMS EX) 750 MG chewable tablet Chew 4 tablets (3,000 mg total) by mouth daily. 02/11/14   Waylan Boga, NP  cetirizine (ZYRTEC) 10 MG tablet Take 1 tablet (10 mg total) by mouth daily. 02/11/14   Waylan Boga, NP  DULoxetine (CYMBALTA) 60 MG capsule Take 1 capsule (60 mg total) by mouth daily. 02/11/14   Waylan Boga, NP  gabapentin (NEURONTIN) 300 MG capsule Take 1 capsule (300 mg total) by mouth 3 (three) times daily. 02/11/14   Waylan Boga, NP  hydrOXYzine (ATARAX/VISTARIL) 25 MG tablet Take 1 tablet (25 mg total) by mouth every 6 (six) hours as needed for anxiety. 02/11/14   Theodoro Clock  Lord, NP  Multiple Vitamins-Minerals (MULTIVITAMIN PO) Take 1 tablet by mouth daily.    Historical Provider, MD  sucralfate (CARAFATE) 1 G tablet Take 1 tablet (1 g total) by mouth 4 (four) times daily. 02/11/14   Waylan Boga, NP   BP 130/84  Pulse 78  Temp(Src) 98.6 F (37 C) (Oral)  Resp 18  SpO2 100%  LMP 02/15/2014 Physical Exam  Nursing note and vitals reviewed. Constitutional: She is oriented to person, place, and time. Vital signs are normal. She appears well-developed and well-nourished. No distress.  HENT:  Head: Normocephalic and atraumatic.  Cardiovascular: Normal rate, regular rhythm and normal pulses.   Pulses:      Radial pulses are 2+ on the right side.  Pulmonary/Chest: Effort normal. No respiratory distress.  Musculoskeletal:       Right wrist: She exhibits tenderness (diffuse). She  exhibits normal range of motion and no swelling.       Right hand: She exhibits tenderness (diffuse) and swelling (mild, diffuse, entire hand ). She exhibits normal range of motion and normal two-point discrimination. Normal sensation noted. Normal strength noted.  Neurological: She is alert and oriented to person, place, and time. She has normal strength and normal reflexes. No sensory deficit. She exhibits normal muscle tone. Coordination normal. GCS eye subscore is 4. GCS verbal subscore is 5. GCS motor subscore is 6.  Skin: Skin is warm and dry. No rash noted. She is not diaphoretic.  Psychiatric: She has a normal mood and affect. Judgment normal.    ED Course  Procedures (including critical care time) Labs Review Labs Reviewed - No data to display  Imaging Review No results found.   MDM   1. Swelling of right upper extremity   2. Right hand pain   3. Paresthesia of right upper extremity    At the time of exam, wrist brace cinched very tight.  I believe this brace is causing her continued symptoms, paresthesias, swelling, acting as a tourniquet.  She should stop wearing the brace, ice, elevate, tylenol PRN.  F/u with hand surg if not improving.  Review of controlled substances database reveals inappropriately high amount of of narcotic Rx, will not Rx narcotics for this patient.  Ice pack provided.      Liam Graham, PA-C 03/20/14 [phone]

## 2014-03-20 NOTE — ED Notes (Signed)
ICE PACK APPLIED.

## 2014-03-20 NOTE — ED Provider Notes (Signed)
Medical screening examination/treatment/procedure(s) were performed by non-physician practitioner and as supervising physician I was immediately available for consultation/collaboration.  Philipp Deputy, M.D.  Harden Mo, MD 03/20/14 [phone]

## 2014-04-20 ENCOUNTER — Emergency Department (HOSPITAL_COMMUNITY)
Admission: EM | Admit: 2014-04-20 | Discharge: 2014-04-20 | Disposition: A | Discharge status: Home / Self Care | Payer: Medicare Other | Attending: Emergency Medicine | Admitting: Emergency Medicine

## 2014-04-20 ENCOUNTER — Encounter (HOSPITAL_COMMUNITY): Payer: Self-pay | Admitting: *Deleted

## 2014-04-20 DIAGNOSIS — K088 Other specified disorders of teeth and supporting structures: Secondary | ICD-10-CM | POA: Insufficient documentation

## 2014-04-20 DIAGNOSIS — G43909 Migraine, unspecified, not intractable, without status migrainosus: Secondary | ICD-10-CM | POA: Insufficient documentation

## 2014-04-20 DIAGNOSIS — M5430 Sciatica, unspecified side: Secondary | ICD-10-CM | POA: Diagnosis not present

## 2014-04-20 DIAGNOSIS — K0381 Cracked tooth: Secondary | ICD-10-CM | POA: Diagnosis not present

## 2014-04-20 DIAGNOSIS — Z720 Tobacco use: Secondary | ICD-10-CM | POA: Diagnosis not present

## 2014-04-20 DIAGNOSIS — K0889 Other specified disorders of teeth and supporting structures: Secondary | ICD-10-CM

## 2014-04-20 DIAGNOSIS — Z79899 Other long term (current) drug therapy: Secondary | ICD-10-CM | POA: Insufficient documentation

## 2014-04-20 DIAGNOSIS — G8929 Other chronic pain: Secondary | ICD-10-CM | POA: Diagnosis not present

## 2014-04-20 DIAGNOSIS — F319 Bipolar disorder, unspecified: Secondary | ICD-10-CM | POA: Insufficient documentation

## 2014-04-20 DIAGNOSIS — I10 Essential (primary) hypertension: Secondary | ICD-10-CM | POA: Insufficient documentation

## 2014-04-20 DIAGNOSIS — Z8742 Personal history of other diseases of the female genital tract: Secondary | ICD-10-CM | POA: Diagnosis not present

## 2014-04-20 MED ORDER — HYDROCODONE-ACETAMINOPHEN 5-325 MG PO TABS
1.0000 | ORAL_TABLET | Freq: Once | ORAL | Status: AC
  Administered 2014-04-20: 1 via ORAL
  Filled 2014-04-20: qty 1

## 2014-04-20 MED ORDER — IBUPROFEN 600 MG PO TABS: 600.0000 mg | ORAL_TABLET | Freq: Four times a day (QID) | ORAL | Status: DC | PRN

## 2014-04-20 MED ORDER — HYDROCODONE-ACETAMINOPHEN 5-325 MG PO TABS: 2.0000 | ORAL_TABLET | ORAL | Status: DC | PRN

## 2014-04-20 NOTE — Discharge Instructions (Signed)
Dental Pain °A tooth ache may be caused by cavities (tooth decay). Cavities expose the nerve of the tooth to air and hot or cold temperatures. It may come from an infection or abscess (also called a boil or furuncle) around your tooth. It is also often caused by dental caries (tooth decay). This causes the pain you are having. °DIAGNOSIS  °Your caregiver can diagnose this problem by exam. °TREATMENT  °· If caused by an infection, it may be treated with medications which kill germs (antibiotics) and pain medications as prescribed by your caregiver. Take medications as directed. °· Only take over-the-counter or prescription medicines for pain, discomfort, or fever as directed by your caregiver. °· Whether the tooth ache today is caused by infection or dental disease, you should see your dentist as soon as possible for further care. °SEEK MEDICAL CARE IF: °The exam and treatment you received today has been provided on an emergency basis only. This is not a substitute for complete medical or dental care. If your problem worsens or new problems (symptoms) appear, and you are unable to meet with your dentist, call or return to this location. °SEEK IMMEDIATE MEDICAL CARE IF:  °· You have a fever. °· You develop redness and swelling of your face, jaw, or neck. °· You are unable to open your mouth. °· You have severe pain uncontrolled by pain medicine. °MAKE SURE YOU:  °· Understand these instructions. °· Will watch your condition. °· Will get help right away if you are not doing well or get worse. °Document Released: 05/10/2005 Document Revised: 08/02/2011 Document Reviewed: 12/27/2007 °ExitCare® Patient Information ©2015 [employer]. This information is not intended to replace advice given to you by your health care provider. Make sure you discuss any questions you have with your health care provider. ° °Emergency Department Resource Guide °1) Find a Doctor and Pay Out of Pocket °Although you won't have to find out who  is covered by your insurance plan, it is a good idea to ask around and get recommendations. You will then need to call the office and see if the doctor you have chosen will accept you as a new patient and what types of options they offer for patients who are self-pay. Some doctors offer discounts or will set up payment plans for their patients who do not have insurance, but you will need to ask so you aren't surprised when you get to your appointment. ° °2) Contact Your Local Health Department °Not all health departments have doctors that can see patients for sick visits, but many do, so it is worth a call to see if yours does. If you don't know where your local health department is, you can check in your phone book. The CDC also has a tool to help you locate your state's health department, and many state websites also have listings of all of their local health departments. ° °3) Find a Walk-in Clinic °If your illness is not likely to be very severe or complicated, you may want to try a walk in clinic. These are popping up all over the country in pharmacies, drugstores, and shopping centers. They're usually staffed by nurse practitioners or physician assistants that have been trained to treat common illnesses and complaints. They're usually fairly quick and inexpensive. However, if you have serious medical issues or chronic medical problems, these are probably not your best option. ° °No Primary Care Doctor: °- Call [facility] at  832-8000 - they can help you locate a primary   care doctor that  accepts your insurance, provides certain services, etc. °- Physician Referral Service- 1-800-533-3463 ° °Chronic Pain Problems: °Organization         Address  Phone   Notes  °[facility]  (336) 297-2271 Patients need to be referred by their primary care doctor.  ° °Medication Assistance: °Organization         Address  Phone   Notes  °[place] [facility] [address]  [address] °[place], [place] [address] (336) 641-8030 --Must be a resident of [place] °-- Must have NO insurance coverage whatsoever (no Medicaid/ Medicare, etc.) °-- The pt. MUST have a primary care doctor that directs their care regularly and follows them in the community °  °[facility]  (866) 331-1348   °[employer]  (888) 892-1162   ° °Agencies that provide inexpensive medical care: °Organization         Address  Phone   Notes  °[facility]  (336) 832-8035   °[facility]    (336) 832-7272   °[facility] [address] °[place], [place] [address] (336) 832-4777   °[facility] [address], °[place] (336) 271-4999   °[facility]    (336) 373-0678   °[facility]    (336) 272-1050   °[facility] ° [address], [place] Phone:  (336) 832-4444, Fax:  (336) 832-4440 Hours of Operation:  9 am - 6 pm, M-F.  Also accepts Medicaid/Medicare and self-pay.  ° [facility] ° [address], [place] Phone: (336) 832-3150, Fax: (336) 832-3151. Hours of Operation:  8:30 am - 5:30 pm, M-F.  Also accepts Medicaid and self-pay.  °[facility] [place] [address], [place] Phone: (336) 878-6027   °[facility] [address], [place], [place] (336)723-1848, Ext. 123 Mondays & Thursdays: 7-9 AM.  First 15 patients are seen on a first come, first serve basis. °  ° °Medicaid-accepting [place] Providers: ° °Organization         Address  Phone   Notes  °[facility] [address], [place] (336) 641-2100 Also accepts self-pay patients.  °[facility] [address], [place] ° (336) 856-9996   °[facility] [address], [place] (336) 288-8857   °[facility] [address] [place] [address], [place] (336) 299-7000   °Veita Bland [address], [place]  ° (336) 373-1557 Only accepts [place] Access Medicaid patients after they have their name applied to their card.  ° °Self-Pay (no insurance) in [place]: ° °Organization         Address  Phone   Notes  °Sickle Cell Patients, [facility] [address], [place] (336) 832-1970   °[facility] [address], [place] (336) 832-4400   °[facility] ° [address], [place] (336) 992-4800   °[facility]/Dr. Osei-Bonsu ° [address] [place] [address], [place] or [address], [place] (336) 841-8500 Phone number for both [place] and [place] locations is the same.  °[facility] [address], [place] (336) 299-0000   °[facility] [address] [place] [address],  or [address] (336) 852-7530 °(336) 878-2260   °  [facility] [address], [place] (336) 350-1642, phone; (336) 294-5005, fax Sees patients 1st and 3rd Saturday of every month.  Must not qualify for public or private insurance (i.e. Medicaid, Medicare, [place] Health Choice, Veterans' Benefits) • Household income should be no more than 200% of the poverty level •The clinic cannot treat you if you are pregnant or think you are pregnant • Sexually transmitted diseases are not treated at the clinic.  ° ° °Dental Care: °Organization         Address  Phone  Notes  °[facility] [address], [place] (336) 641-6152 Accepts children up to age 21 who are enrolled in Medicaid or [place] Health Choice; pregnant women with a Medicaid card; and children who have applied for Medicaid or [place] Health Choice, but were declined, whose parents can pay a reduced fee at time of service.  °[facility] [place]  [address], [place] (336) 641-7733 Accepts children up to age 21 who are  enrolled in Medicaid or [place] Health Choice; pregnant women with a Medicaid card; and children who have applied for Medicaid or [place] Health Choice, but were declined, whose parents can pay a reduced fee at time of service.  °[facility] ° [address], [place] (336) 641-4533 Patients are seen by appointment only. Walk-ins are not accepted. [facility] will see patients 18 years of age and older. °Monday - Tuesday (8am-5pm) °Most Wednesdays (8:30-5pm) °$30 per visit, cash only  °[facility] ° [address], [place] (336) 641-4533 Patients are seen by appointment only. Walk-ins are not accepted. [facility] will see patients 18 years of age and older. °One Wednesday Evening (Monthly: Volunteer Based).  $30 per visit, cash only  °[facility]  (919) 537-3737 for adults; Children under age 4, call Graduate Pediatric Dentistry at ([phone]) [phone]. Children aged 4-14, please call (919) [phone] to request a pediatric application. ° Dental services are provided in all areas of dental care including fillings, crowns and bridges, complete and partial dentures, implants, gum treatment, root canals, and extractions. Preventive care is also provided. Treatment is provided to both adults and children. °Patients are selected via a lottery and there is often a waiting list. °  °[facility] [address], ° ° (336) 763-8833 [email or web address] °  °[facility] [address], [place], [place] (336)723-1848, Ext. 123 Second and Fourth Thursday of each month, opens at 6:30 AM; Clinic ends at 9 AM.  Patients are seen on a first-come first-served basis, and a limited number are seen during each clinic.  ° °[facility] ° [address], [place], [place] (336) 723-7904   Eligibility Requirements °You must have lived in [place], [place], or [place] counties for at least the last three months. °  You  cannot be eligible for state or federal sponsored healthcare insurance, including Veterans Administration, Medicaid, or Medicare. °  You generally cannot be eligible for healthcare insurance through your employer.  °  How to apply: °Eligibility screenings are held every Tuesday and Wednesday afternoon from 1:00 pm until 4:00 pm. You do not need an appointment for the interview!  °[facility] [address], [place],  336-631-2330   °[facility]  336-342-8273   °[facility]  336-703-3100   °[facility]  336-570-6415   ° °Behavioral Health Resources in the Community: °Intensive Outpatient Programs °Organization         Address  Phone  Notes  °[facility] [address], [place], [place] 336-878-6098   °[facility] [address], [place], [place] 336-832-9800   °[facility] [address], [place], [place] ° 336-882-2125   °[facility] [address],  °[place], [place] 1-800-853-5163 or 336-641-4981   °Substance Abuse Resources °Organization         Address  Phone  Notes  °[facility]  336-882-2125   °[facility]  336-784-9470   °[facility]  336-285-9073   °[facility]  336-845-3988   °Residential & Outpatient Substance Abuse Program  1-800-659-3381   °Psychological Services °Organization         Address  Phone  Notes  °[facility]  336- 832-9600   °[facility]  336- 378-7881   °[facility] [address], [place] 1-800-853-5163 or 336-641-4981   ° °Mobile Crisis Teams °Organization         Address  Phone  Notes  °[facility], Mobile Crisis Care Unit  1-877-626-1772   °Assertive °[facility] ° [address] [place], [place] 336-834-9664   °Sharon DeEsch [address] °[place] 336-554-5454   ° °Self-Help/Support  Groups °Organization         Address  Phone             Notes  °[facility] [place] - variety of support groups  336- 373-1402 Call for more information  °Narcotics Anonymous (NA), [facility] [address], °[place]  2 meetings at this location  ° °Residential Treatment Programs °Organization         Address  Phone  Notes  °[facility] [address],    °[place]  1-866-801-8205   °[facility] ° [address], [place], [place] 704-293-8524   °[facility] [address], [place] 336-845-3988 Admissions: 8am-3pm M-F  °[facility] [address]    °[place], [place] 336-841-1104   °[facility] [address], [place], [place] 336-379-7146   °[facility] [address]  °[place], [place] 336-285-9073   °[facility] Intensive Outpatient [address], [place], [place] 336-852-3033   °[facility] [address]  °[place], [place] 1-877-615-2722 or 336-784-9470   °[facility] [address] [place], [place] 336-227-7417 Accepts Medicaid  °[facility] [address]  ° [place] 1-800-659-3381 Substance Abuse/Addiction Treatment  ° °[place] Behavioral Health Resources °Organization         Address  Phone  Notes  °[facility]  (888) 581-9988   °Julie Brannon, PhD [address] [place], [place]   (336) 349-5553 or (336) 951-0000   °[facility]   [address] °[place], [place] (336) 349-4454   °[facility] [address], [place],  (336) 342-8316 Insurance/Medicaid/sponsorship through [facility]  °[facility] [address]                                    [place],  (336) 342-8316 Therapy/tele-psych/case  °[facility]   [address]  ° [place], [place] (336) 349-2233    °Dr. Arfeen  (336) 349-4544   °[facility] [place]  [employer] [place] [facility] 1) [address], [place] °2) [address], [place] °3)  [address], [place] (336) 349-3220 °(336) 342-7768 ° °(336) 342-8140   °[place] [facility] (336) 342-1394 or (336) 342-3537 (After Hours)    ° ° ° °

## 2014-04-20 NOTE — ED Notes (Signed)
Pt sts has an infected tooth on the right upper side, was seen by dentist on Tuesday given tylenol 3 and amoxicillin, however no relief. Pt reports pain is now spreading to her neck and shoulder.

## 2014-04-20 NOTE — ED Provider Notes (Signed)
CSN: [id number]     Arrival date & time 04/20/14  0706 History   First MD Initiated Contact with Patient 04/20/14 0749     Chief Complaint  Patient presents with  . Dental Pain   Terri [name] is a 38 y.o. female with a hx of chronic low back pain and hypertension who presents the emergency department complaining of one week of dental pain. Patient reports she's been having pain in her right upper molar 1 week. Patient reports she saw her dentist Dr. Adair Laundry 5 days ago who told her she needed her tooth removed and started her on amoxicillin and Tylenol No. 3. Patient has an appointment in December to have her tooth removed. The patient reports her Tylenol 3 is not helping her pain any longer. Patient reports her pain is 10 out of 10 and worse with eating. Patient reports she has been taking her amoxicillin as prescribed. The patient denies fevers, chills, difficulty swallowing, facial swelling, nasal congestion, drainage from her mouth, dental abscesses, changes to her vision, abdominal pain, vomiting, or rashes on her body. She denies history of dental abscesses.  (Consider location/radiation/quality/duration/timing/severity/associated sxs/prior Treatment) HPI  Past Medical History  Diagnosis Date  . Hypertension   . Chronic back pain   . Migraine headache   . Stress incontinence   . Sciatica   . Bipolar disorder    Past Surgical History  Procedure Laterality Date  . Fracture surgery    . Back surgery    . Cesarean section    . Gastric bypass    . Ankle surgery    . Lumbar disc surgery    . Lumbar fusion    . Tubal ligation     Family History  Problem Relation Age of Onset  . Diabetes Mother   . Hypertension Mother   . Diabetes Father   . Hypertension Father    History  Substance Use Topics  . Smoking status: Current Every Day Smoker -- 1.00 packs/day    Types: Cigarettes  . Smokeless tobacco: Not on file  . Alcohol Use: No   OB History    No data available      Review of Systems  Constitutional: Negative for fever and chills.  HENT: Positive for dental problem. Negative for congestion, drooling, ear discharge, facial swelling, nosebleeds, rhinorrhea, sinus pressure, sore throat, trouble swallowing and voice change.   Eyes: Negative for visual disturbance.  Respiratory: Negative for cough, shortness of breath and wheezing.   Cardiovascular: Negative for chest pain and palpitations.  Gastrointestinal: Negative for nausea, vomiting, abdominal pain and diarrhea.  Genitourinary: Negative for dysuria.  Musculoskeletal: Negative for back pain and neck stiffness.  Skin: Negative for pallor, rash and wound.  Neurological: Negative for facial asymmetry, weakness, light-headedness and headaches.  All other systems reviewed and are negative.     Allergies  Ketorolac tromethamine and Methocarbamol  Home Medications   Prior to Admission medications   Medication Sig Start Date End Date Taking? Authorizing Provider  amitriptyline (ELAVIL) 75 MG tablet Take 1 tablet (75 mg total) by mouth at bedtime. 02/11/14   Waylan Boga, NP  amLODipine (NORVASC) 5 MG tablet Take 1 tablet (5 mg total) by mouth daily. 02/11/14   Waylan Boga, NP  baclofen (LIORESAL) 10 MG tablet Take 1 tablet (10 mg total) by mouth 3 (three) times daily. 02/11/14   Waylan Boga, NP  calcium carbonate (TUMS EX) 750 MG chewable tablet Chew 4 tablets (3,000 mg total) by mouth daily. 02/11/14  Waylan Boga, NP  cetirizine (ZYRTEC) 10 MG tablet Take 1 tablet (10 mg total) by mouth daily. 02/11/14   Waylan Boga, NP  DULoxetine (CYMBALTA) 60 MG capsule Take 1 capsule (60 mg total) by mouth daily. 02/11/14   Waylan Boga, NP  gabapentin (NEURONTIN) 300 MG capsule Take 1 capsule (300 mg total) by mouth 3 (three) times daily. 02/11/14   Waylan Boga, NP  HYDROcodone-acetaminophen (NORCO/VICODIN) 5-325 MG per tablet Take 2 tablets by mouth every 4 (four) hours as needed for moderate pain or severe pain.  04/20/14   Verda Cumins Treylon Henard, PA-C  hydrOXYzine (ATARAX/VISTARIL) 25 MG tablet Take 1 tablet (25 mg total) by mouth every 6 (six) hours as needed for anxiety. 02/11/14   Waylan Boga, NP  ibuprofen (ADVIL,MOTRIN) 600 MG tablet Take 1 tablet (600 mg total) by mouth every 6 (six) hours as needed. 04/20/14   Verda Cumins Jamea Robicheaux, PA-C  Multiple Vitamins-Minerals (MULTIVITAMIN PO) Take 1 tablet by mouth daily.    Historical Provider, MD  sucralfate (CARAFATE) 1 G tablet Take 1 tablet (1 g total) by mouth 4 (four) times daily. 02/11/14   Waylan Boga, NP   BP 120/85 mmHg  Pulse 107  Temp(Src) 98.3 F (36.8 C) (Oral)  Resp 20  SpO2 98%  LMP 03/24/2014 Physical Exam  Constitutional: She appears well-developed and well-nourished. No distress.  HENT:  Head: Normocephalic and atraumatic.  Right Ear: External ear normal.  Left Ear: External ear normal.  Nose: Nose normal.  Mouth/Throat: Oropharynx is clear and moist. No oropharyngeal exudate.  Patient has poor dental hygiene. Patient has a cracked right upper molar. There is no evidence of abscess, induration or fluctuance. There is no evidence of drainage. The patient's uvula is midline without edema and patient's soft palate rises symmetrically. No tonsillar hypertrophy or exudates. There is no facial swelling.   Eyes: Conjunctivae are normal. Pupils are equal, round, and reactive to light. Right eye exhibits no discharge. Left eye exhibits no discharge.  Neck: Normal range of motion. Neck supple.  Patient is able to put her chin to her chest as well as look upwards. The patient is able to turn her head greater than 45 in each direction. Patient has some right-sided reactive submandibular lymphadenopathy. No cervical lymphadenopathy  Cardiovascular: Normal rate, regular rhythm, normal heart sounds and intact distal pulses.  Exam reveals no gallop and no friction rub.   No murmur heard. Patient's heart rate is 88 at the time of my examination.   Pulmonary/Chest: Effort normal and breath sounds normal. No respiratory distress. She has no wheezes. She has no rales.  Abdominal: Soft. There is no tenderness.  Musculoskeletal: She exhibits no edema.  Lymphadenopathy:    She has no cervical adenopathy.  Neurological: She is alert. No cranial nerve deficit. Coordination normal.  Skin: Skin is warm and dry. No rash noted. She is not diaphoretic. No erythema. No pallor.  Psychiatric: She has a normal mood and affect. Her behavior is normal.  Nursing note and vitals reviewed.   ED Course  Procedures (including critical care time) Labs Review Labs Reviewed - No data to display  Imaging Review No results found.   EKG Interpretation None      Filed Vitals:   04/20/14 0730  BP: 120/85  Pulse: 107  Temp: 98.3 F (36.8 C)  TempSrc: Oral  Resp: 20  SpO2: 98%     MDM   Meds given in ED:  Medications  HYDROcodone-acetaminophen (NORCO/VICODIN) 5-325 MG per tablet 1  tablet (1 tablet Oral Given 04/20/14 0842)    New Prescriptions   HYDROCODONE-ACETAMINOPHEN (NORCO/VICODIN) 5-325 MG PER TABLET    Take 2 tablets by mouth every 4 (four) hours as needed for moderate pain or severe pain.   IBUPROFEN (ADVIL,MOTRIN) 600 MG TABLET    Take 1 tablet (600 mg total) by mouth every 6 (six) hours as needed.    Final diagnoses:  Pain, dental   Terri [name] is a 38 y.o. female with a hx of chronic low back pain and hypertension who presents the emergency department complaining of one week of dental pain. Patient reports she's been having pain in her right upper molar 1 week. Patient reports she saw her dentist Dr. Adair Laundry 5 days ago who told her she needed her tooth removed and started her on amoxicillin and Tylenol No. 3. The patient is afebrile and nontoxic appearing. The patient's heart rate was 88 at the time of my examination. The patient has a cracked right upper molar. There is no evidence of induration, fluctuance or abscess. There  is no Ludwig angina. There is no cervical lymphadenopathy. There is no frank facial swelling. The patient's cranial nerves II through XII are intact bilaterally.  Patient is currently on amoxicillin as prescribed by her dentist. Will prescribe Norco and ibuprofen to help with her pain until she can be seen for her tooth removal. I advised patient to use caution and not to drive while taking Norco as it can make her drowsy. The patient is provided a [employer] of other dental providers. I advised she should give them a call to see if she can get in more quickly to have her tooth removed. I advised patient return to the emergency department with new or worsening symptoms or new concerns. The patient verbalized understanding and agreement with plan.  The patient was discussed with Dr. Cathleen Fears who agrees with assessment and plan.     Hanley Hays, PA-C 04/20/14 1624  Orlie Dakin, MD 04/20/14 [phone]

## 2014-05-05 ENCOUNTER — Encounter (HOSPITAL_COMMUNITY): Payer: Self-pay | Admitting: *Deleted

## 2014-05-05 ENCOUNTER — Emergency Department (HOSPITAL_COMMUNITY): Payer: Medicare Other

## 2014-05-05 ENCOUNTER — Emergency Department (HOSPITAL_COMMUNITY)
Admission: EM | Admit: 2014-05-05 | Discharge: 2014-05-05 | Disposition: A | Discharge status: Home / Self Care | Payer: Medicare Other | Attending: Emergency Medicine | Admitting: Emergency Medicine

## 2014-05-05 DIAGNOSIS — G43909 Migraine, unspecified, not intractable, without status migrainosus: Secondary | ICD-10-CM | POA: Diagnosis not present

## 2014-05-05 DIAGNOSIS — Z791 Long term (current) use of non-steroidal anti-inflammatories (NSAID): Secondary | ICD-10-CM | POA: Insufficient documentation

## 2014-05-05 DIAGNOSIS — Z79899 Other long term (current) drug therapy: Secondary | ICD-10-CM | POA: Diagnosis not present

## 2014-05-05 DIAGNOSIS — M542 Cervicalgia: Secondary | ICD-10-CM | POA: Insufficient documentation

## 2014-05-05 DIAGNOSIS — F319 Bipolar disorder, unspecified: Secondary | ICD-10-CM | POA: Insufficient documentation

## 2014-05-05 DIAGNOSIS — Z8739 Personal history of other diseases of the musculoskeletal system and connective tissue: Secondary | ICD-10-CM | POA: Insufficient documentation

## 2014-05-05 DIAGNOSIS — G8929 Other chronic pain: Secondary | ICD-10-CM | POA: Diagnosis not present

## 2014-05-05 DIAGNOSIS — I10 Essential (primary) hypertension: Secondary | ICD-10-CM | POA: Insufficient documentation

## 2014-05-05 DIAGNOSIS — Z87448 Personal history of other diseases of urinary system: Secondary | ICD-10-CM | POA: Diagnosis not present

## 2014-05-05 DIAGNOSIS — R000 Tachycardia, unspecified: Secondary | ICD-10-CM | POA: Diagnosis not present

## 2014-05-05 DIAGNOSIS — Z720 Tobacco use: Secondary | ICD-10-CM | POA: Insufficient documentation

## 2014-05-05 DIAGNOSIS — K088 Other specified disorders of teeth and supporting structures: Secondary | ICD-10-CM | POA: Insufficient documentation

## 2014-05-05 DIAGNOSIS — K0889 Other specified disorders of teeth and supporting structures: Secondary | ICD-10-CM

## 2014-05-05 DIAGNOSIS — R52 Pain, unspecified: Secondary | ICD-10-CM

## 2014-05-05 MED ORDER — HYDROMORPHONE HCL 1 MG/ML IJ SOLN
1.0000 mg | Freq: Once | INTRAMUSCULAR | Status: AC
  Administered 2014-05-05: 1 mg via INTRAVENOUS
  Filled 2014-05-05: qty 1

## 2014-05-05 MED ORDER — LORAZEPAM 2 MG/ML IJ SOLN
0.5000 mg | Freq: Once | INTRAMUSCULAR | Status: AC
  Administered 2014-05-05: 0.5 mg via INTRAVENOUS
  Filled 2014-05-05: qty 1

## 2014-05-05 MED ORDER — SODIUM CHLORIDE 0.9 % IV BOLUS (SEPSIS)
1000.0000 mL | Freq: Once | INTRAVENOUS | Status: AC
Start: 2014-05-05 — End: 2014-05-05
  Administered 2014-05-05: 1000 mL via INTRAVENOUS

## 2014-05-05 MED ORDER — HYDROCODONE-ACETAMINOPHEN 5-325 MG PO TABS: 1.0000 | ORAL_TABLET | ORAL | Status: DC | PRN

## 2014-05-05 MED ORDER — HYDROMORPHONE HCL 1 MG/ML IJ SOLN
0.5000 mg | Freq: Once | INTRAMUSCULAR | Status: AC
  Administered 2014-05-05: 0.5 mg via INTRAVENOUS
  Filled 2014-05-05: qty 1

## 2014-05-05 MED ORDER — IOHEXOL 300 MG/ML  SOLN
100.0000 mL | Freq: Once | INTRAMUSCULAR | Status: AC | PRN
  Administered 2014-05-05: 100 mL via INTRAVENOUS

## 2014-05-05 MED ORDER — ONDANSETRON HCL 4 MG/2ML IJ SOLN
4.0000 mg | Freq: Once | INTRAMUSCULAR | Status: AC
  Administered 2014-05-05: 4 mg via INTRAVENOUS
  Filled 2014-05-05: qty 2

## 2014-05-05 NOTE — ED Notes (Signed)
Patient had lower right wisdom tooth and upper right back molar pulled by Dr. Stefanie Libel on Wednesday past.  Patient states she has had pain since then, but yesterday the pain became severe.  Patient states her entire head now hurts, extending down in to her neck.  Patient endorses nausea and fever as high as 101 at home.  Patient has taken Percocet and Ibuprofen for pain with no relief.  Patient states, "I've had 5 back surgeries and I've never had pain like this."  Patient called her dentist and he cannot see her until this coming Tuesday.

## 2014-05-05 NOTE — ED Notes (Signed)
Awake. Verbally responsive. A/O x4. Resp even and unlabored. No audible adventitious breath sounds noted. ABC's intact. NAD noted. 

## 2014-05-05 NOTE — ED Notes (Addendum)
Awake. Verbally responsive. A/O x4. Resp even and unlabored. No audible adventitious breath sounds noted. ABC's intact. Reports having mouth pain. Pt reported having CT scan. Awaiting results.

## 2014-05-05 NOTE — ED Provider Notes (Signed)
CSN: [id number]     Arrival date & time 05/05/14  1659 History   First MD Initiated Contact with Patient 05/05/14 1748     Chief Complaint  Patient presents with  . Dental Pain   Terri [name] is a 38 y.o. female with a history of hypertension and chronic low back pain who presents to the ED complaining of right sided facial pain and tooth pain after a dental extraction by Dr. Hoyt Koch 5 days ago. Patient reports she is having pain since a dental extraction and has been taking Percocet with minimal relief. Patient reports that her pain worsened yesterday and now she was her pain being 10 out of 10 and shooting. She reports that her pain extends into her right ear, head and neck. Patient also reports fever at home with a maximum temperature of 101 today. Patient reports took 5 ibuprofen earlier today without relief. Patient also reports history of nausea but denies vomiting. Patient denies drainage from her mouth. Patient has been drinking fluids okay today and last ate yesterday, because it hurts to eat.  Patient said she spoke to dentist Dr. Hoyt Koch today who requested she come to the emergency department for evaluation. Patient denies vomiting, abdominal pain, rashes, numbness, tingling, drainage from her mouth, chest pain, shortness of breath, palpitations.   (Consider location/radiation/quality/duration/timing/severity/associated sxs/prior Treatment) HPI  Past Medical History  Diagnosis Date  . Hypertension   . Chronic back pain   . Migraine headache   . Stress incontinence   . Sciatica   . Bipolar disorder    Past Surgical History  Procedure Laterality Date  . Fracture surgery    . Back surgery    . Cesarean section    . Gastric bypass    . Ankle surgery    . Lumbar disc surgery    . Lumbar fusion    . Tubal ligation     Family History  Problem Relation Age of Onset  . Diabetes Mother   . Hypertension Mother   . Diabetes Father   . Hypertension Father    History  Substance  Use Topics  . Smoking status: Current Every Day Smoker -- 1.00 packs/day    Types: Cigarettes  . Smokeless tobacco: Never Used  . Alcohol Use: No   OB History    No data available     Review of Systems  Constitutional: Positive for fever. Negative for chills.  HENT: Positive for dental problem. Negative for congestion, ear pain, hearing loss, mouth sores, rhinorrhea, sinus pressure, sore throat and trouble swallowing.   Eyes: Negative for visual disturbance.  Respiratory: Negative for cough, shortness of breath and wheezing.   Cardiovascular: Negative for chest pain, palpitations and leg swelling.  Gastrointestinal: Negative for nausea, vomiting, abdominal pain and diarrhea.  Genitourinary: Negative for dysuria, hematuria and difficulty urinating.  Musculoskeletal: Positive for neck pain. Negative for back pain.  Skin: Negative for rash.  Neurological: Negative for dizziness, syncope, weakness, light-headedness and headaches.      Allergies  Ketorolac tromethamine and Methocarbamol  Home Medications   Prior to Admission medications   Medication Sig Start Date End Date Taking? Authorizing Provider  amitriptyline (ELAVIL) 75 MG tablet Take 1 tablet (75 mg total) by mouth at bedtime. 02/11/14  Yes Waylan Boga, NP  amLODipine (NORVASC) 10 MG tablet Take 10 mg by mouth daily.   Yes Historical Provider, MD  gabapentin (NEURONTIN) 300 MG capsule Take 1 capsule (300 mg total) by mouth 3 (three) times daily. 02/11/14  Yes Waylan Boga, NP  ibuprofen (ADVIL,MOTRIN) 200 MG tablet Take 1,000 mg by mouth every 6 (six) hours as needed for fever or moderate pain.   Yes Historical Provider, MD  Multiple Vitamins-Minerals (MULTIVITAMIN PO) Take 1 tablet by mouth daily.   Yes Historical Provider, MD  amLODipine (NORVASC) 5 MG tablet Take 1 tablet (5 mg total) by mouth daily. Patient not taking: Reported on 05/05/2014 02/11/14   Waylan Boga, NP  baclofen (LIORESAL) 10 MG tablet Take 1 tablet (10  mg total) by mouth 3 (three) times daily. Patient not taking: Reported on 05/05/2014 02/11/14   Waylan Boga, NP  calcium carbonate (TUMS EX) 750 MG chewable tablet Chew 4 tablets (3,000 mg total) by mouth daily. Patient not taking: Reported on 05/05/2014 02/11/14   Waylan Boga, NP  cetirizine (ZYRTEC) 10 MG tablet Take 1 tablet (10 mg total) by mouth daily. Patient not taking: Reported on 05/05/2014 02/11/14   Waylan Boga, NP  DULoxetine (CYMBALTA) 60 MG capsule Take 1 capsule (60 mg total) by mouth daily. Patient not taking: Reported on 05/05/2014 02/11/14   Waylan Boga, NP  HYDROcodone-acetaminophen (NORCO/VICODIN) 5-325 MG per tablet Take 1-2 tablets by mouth every 4 (four) hours as needed for moderate pain or severe pain. 05/05/14   Verda Cumins Rosezella Kronick, PA-C  hydrOXYzine (ATARAX/VISTARIL) 25 MG tablet Take 1 tablet (25 mg total) by mouth every 6 (six) hours as needed for anxiety. Patient not taking: Reported on 05/05/2014 02/11/14   Waylan Boga, NP  ibuprofen (ADVIL,MOTRIN) 600 MG tablet Take 1 tablet (600 mg total) by mouth every 6 (six) hours as needed. Patient not taking: Reported on 05/05/2014 04/20/14   Verda Cumins Dynisha Due, PA-C  sucralfate (CARAFATE) 1 G tablet Take 1 tablet (1 g total) by mouth 4 (four) times daily. Patient not taking: Reported on 05/05/2014 02/11/14   Waylan Boga, NP   BP 131/98 mmHg  Pulse 110  Temp(Src) 98.3 F (36.8 C) (Oral)  Resp 18  Ht 5\' 9"  (1.753 m)  Wt 260 lb (117.935 kg)  BMI 38.38 kg/m2  SpO2 96%  LMP 04/29/2014 Physical Exam  Constitutional: She is oriented to person, place, and time. She appears well-developed and well-nourished. No distress.  HENT:  Head: Normocephalic and atraumatic.  Right Ear: External ear normal.  Left Ear: External ear normal.  Nose: Nose normal.  Mouth/Throat: Oropharynx is clear and moist. No oropharyngeal exudate.  Patient had a right upper and right lower tooth removed. There is no evidence of infection or  abscess. There is no induration or fluctuant mass. There is no facial swelling present. Patient's sensation is intact in her bilateral face. There is no drainage from now. The patient's uvula is midline without edema. The patient's soft palate rises symmetrically. Patient's bilateral tympanic membranes are pearly-gray without erythema or loss of landmarks.  Eyes: Conjunctivae and EOM are normal. Pupils are equal, round, and reactive to light. Right eye exhibits no discharge. Left eye exhibits no discharge.  Neck: Normal range of motion. Neck supple.  Cardiovascular: Regular rhythm, normal heart sounds and intact distal pulses.  Exam reveals no gallop and no friction rub.   No murmur heard. Slightly tachycardic at 116. Bilateral radial pulses are intact.  Pulmonary/Chest: Effort normal and breath sounds normal. No respiratory distress. She has no wheezes. She has no rales.  Abdominal: Soft. She exhibits no distension. There is no tenderness.  Musculoskeletal: She exhibits no edema.  Patient is spontaneously moving all extremities in a coordinated fashion exhibiting good strength.  Patient is able to ambulate without difficulty or assistance.  Lymphadenopathy:    She has no cervical adenopathy.  Neurological: She is alert and oriented to person, place, and time. No cranial nerve deficit. Coordination normal.  Cranial nerves II through XII are intact bilaterally. Patient's sensation is intact in her bilateral face.  Skin: Skin is warm and dry. No rash noted. She is not diaphoretic. No erythema. No pallor.  Psychiatric: She has a normal mood and affect. Her behavior is normal.  Nursing note and vitals reviewed.   ED Course  Procedures (including critical care time) Labs Review Labs Reviewed - No data to display  Imaging Review Ct Soft Tissue Neck W Contrast  05/05/2014   CLINICAL DATA:  Severe neck pain and fever after tooth extraction.  EXAM: CT NECK WITH CONTRAST  TECHNIQUE: Multidetector CT  imaging of the neck was performed using the standard protocol following the bolus administration of intravenous contrast.  CONTRAST:  125mL OMNIPAQUE IOHEXOL 300 MG/ML  SOLN  COMPARISON:  None.  FINDINGS: Visualized portions of upper lung fields appear normal. Globes and orbits appear normal. Visualized portions of paranasal sinuses appear normal. Parotid and submandibular glands appear normal. Larynx and thyroid gland appear normal. Epiglottis appears normal. No significant adenopathy is noted. Visualized vasculature appears normal. Large defect is seen posteriorly in right mandible consistent with history of wisdom tooth extraction. Chest superior and medial to this, soft tissue gas is noted extending to the right parapharyngeal space. No abscess or fluid collection is noted.  IMPRESSION: Findings consistent with wisdom tooth extraction involving the right posterior mandible. Air-filled defect is noted in the extraction site of the right posterior mandible. Air is also noted extending medially in superior from this site into the right parapharyngeal space consistent with postsurgical finding. No abscess or fluid collection is noted.   Electronically Signed   By: Sabino Dick M.D.   On: 05/05/2014 19:34     EKG Interpretation   Date/Time:  Sunday May 05 2014 20:49:56 EST Ventricular Rate:  113 PR Interval:  155 QRS Duration: 82 QT Interval:  321 QTC Calculation: 440 R Axis:   66 Text Interpretation:  Sinus tachycardia Confirmed by Ashok Cordia  MD, KEVIN  ([id number]) on 05/05/2014 9:18:24 PM      Filed Vitals:   05/05/14 2030 05/05/14 2050 05/05/14 2100 05/05/14 2100  BP: 145/101 131/98 121/101 131/98  Pulse: 145 116 120 110  Temp:      TempSrc:      Resp:  16 16 18   Height:      Weight:      SpO2: 97% 97% 96% 96%     MDM   Meds given in ED:  Medications  HYDROmorphone (DILAUDID) injection 1 mg (1 mg Intravenous Given 05/05/14 1827)  sodium chloride 0.9 % bolus 1,000 mL (1,000 mLs  Intravenous New Bag/Given 05/05/14 1827)  ondansetron (ZOFRAN) injection 4 mg (4 mg Intravenous Given 05/05/14 1827)  LORazepam (ATIVAN) injection 0.5 mg (0.5 mg Intravenous Given 05/05/14 1833)  iohexol (OMNIPAQUE) 300 MG/ML solution 100 mL (100 mLs Intravenous Contrast Given 05/05/14 1909)  HYDROmorphone (DILAUDID) injection 0.5 mg (0.5 mg Intravenous Given 05/05/14 2055)  LORazepam (ATIVAN) injection 0.5 mg (0.5 mg Intravenous Given 05/05/14 2056)    Discharge Medication List as of 05/05/2014  9:44 PM      Final diagnoses:  Pain  Pain, dental    Terri [name] is a 38 y.o. female with a history of hypertension and chronic low back pain  who presents to the ED complaining of right sided facial pain and tooth pain after a dental extraction by Dr. Hoyt Koch 5 days ago. Patient is afebrile and nontoxic appearing. There is no evidence of abscess, infection, or mouth drainage. No facial swelling. Patient's cranial nerves II through XII are intact bilaterally. Patient's oropharynx is clear and soft palate rises symmetrically.  The patient had a CT of her soft tissue neck with contrast. This indicated findings consistent with a wisdom tooth extraction. There is no abscess or fluid collection noted. Will discharge the patient with Norco for pain control. Patient reports she has a follow-up appointment with her dentist in 2 days. Advised patient to keep this follow-up appointment. Advised patient to return the emergency room with new worsening symptoms or new concerns. Patient verbalized understanding and agreement with plan.  This patient was discussed with and evaluated by Dr. Ashok Cordia who agrees with assessment and plan.      Hanley Hays, PA-C 05/06/14 3968  Mirna Mires, MD 05/08/14 [phone]

## 2014-05-05 NOTE — Discharge Instructions (Signed)

## 2014-05-14 IMAGING — CR DG THORACIC SPINE 2V
3 series · 3 of 3 positions shown · non-contrast
Comparison: None.

CLINICAL DATA: Status post fall 1 week ago with upper back pain

EXAM:
THORACIC SPINE - 2 VIEW

[t thoracic spine ap]
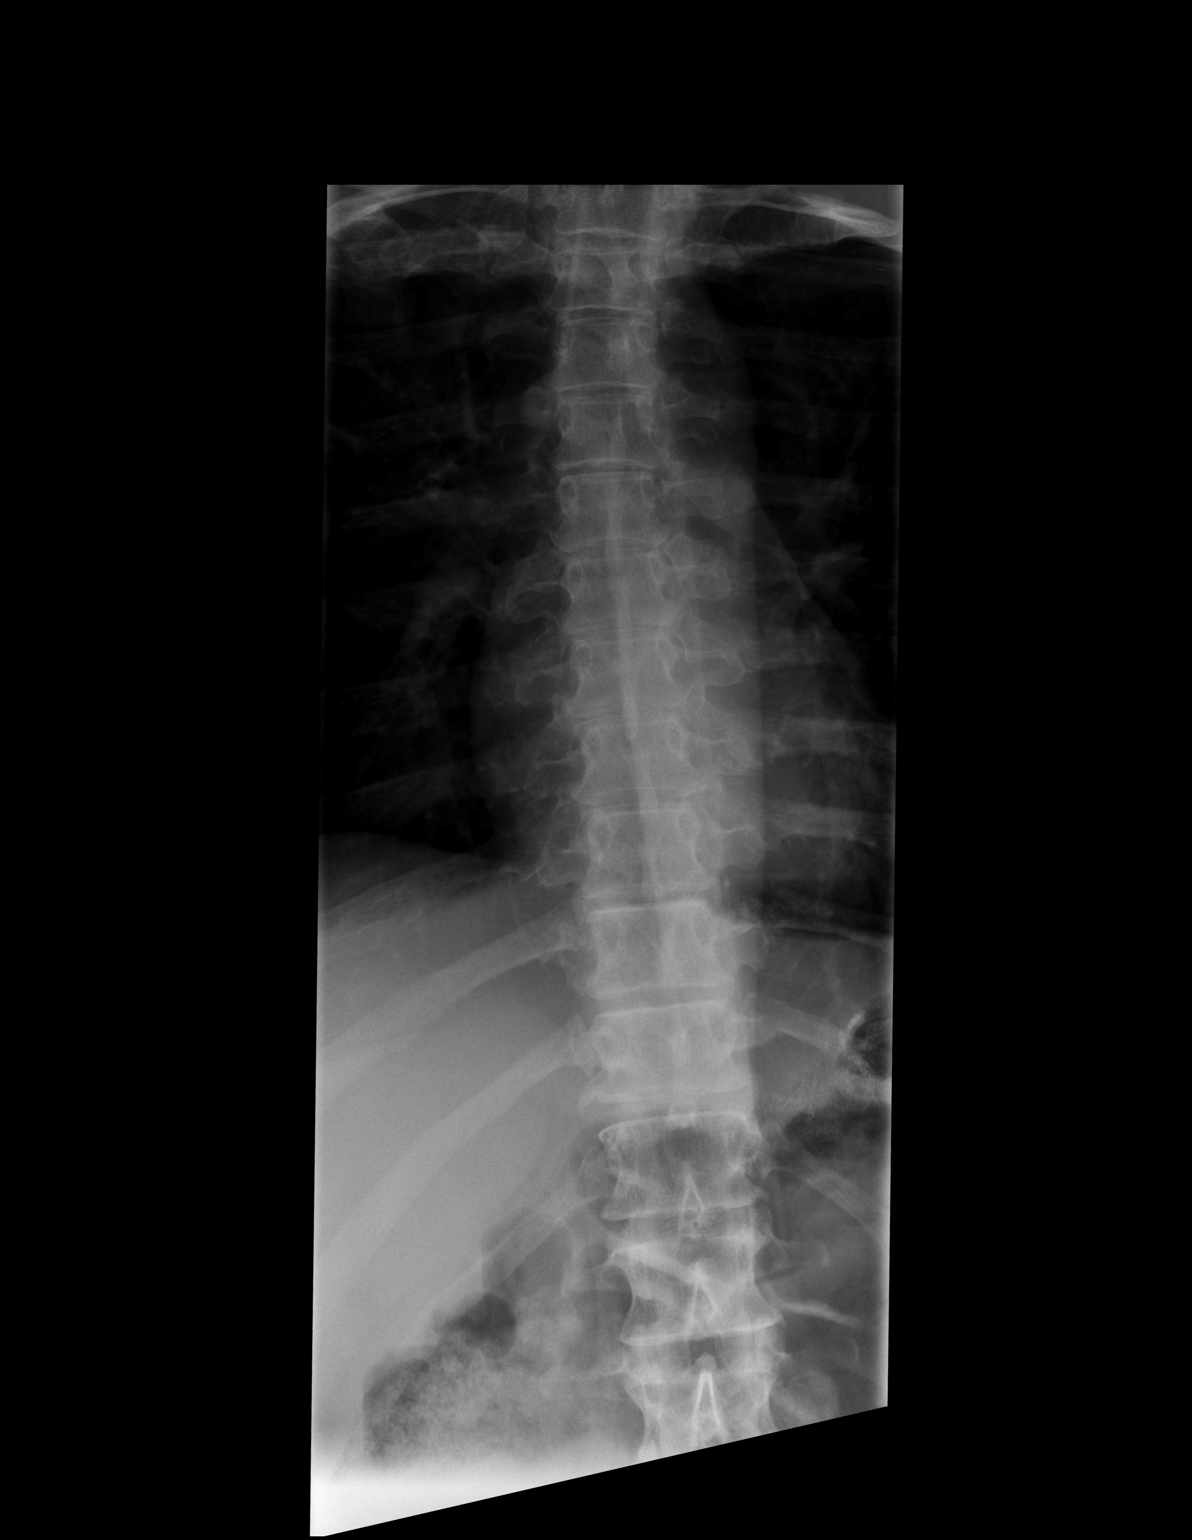

[t thoracic spine lat (1 of 2)]
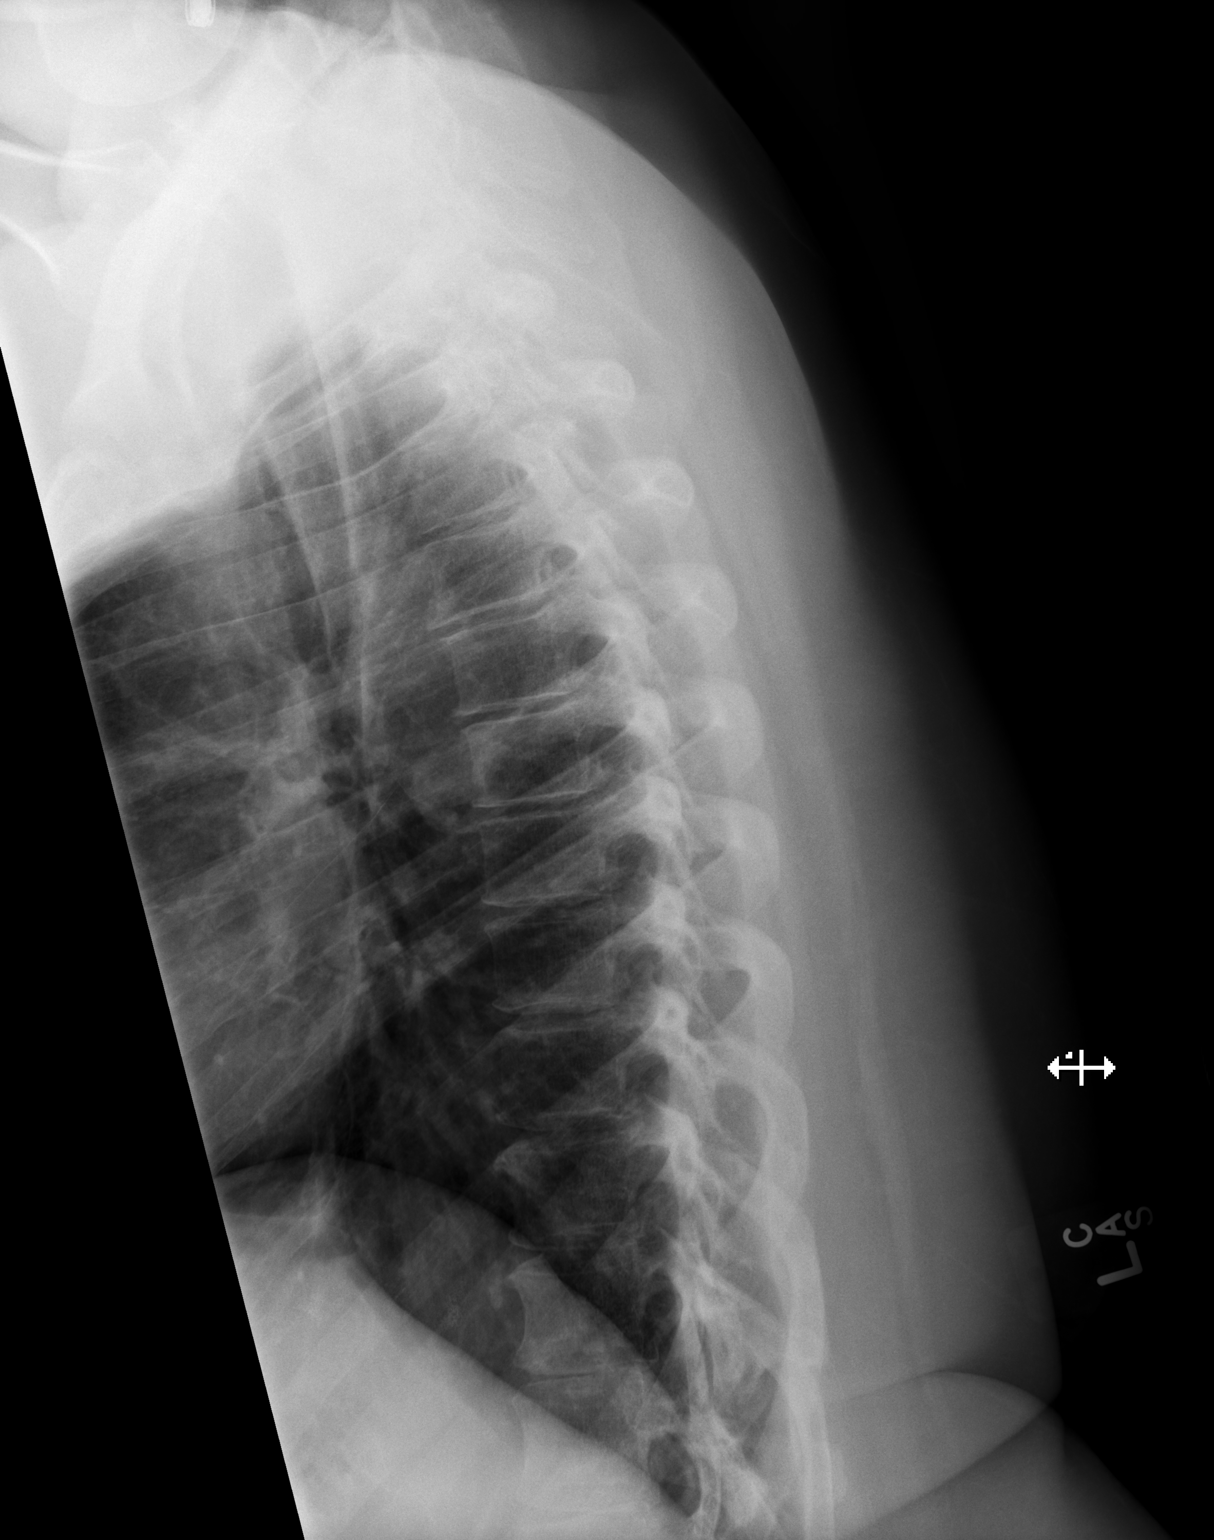

[t thoracic spine lat (2 of 2)]
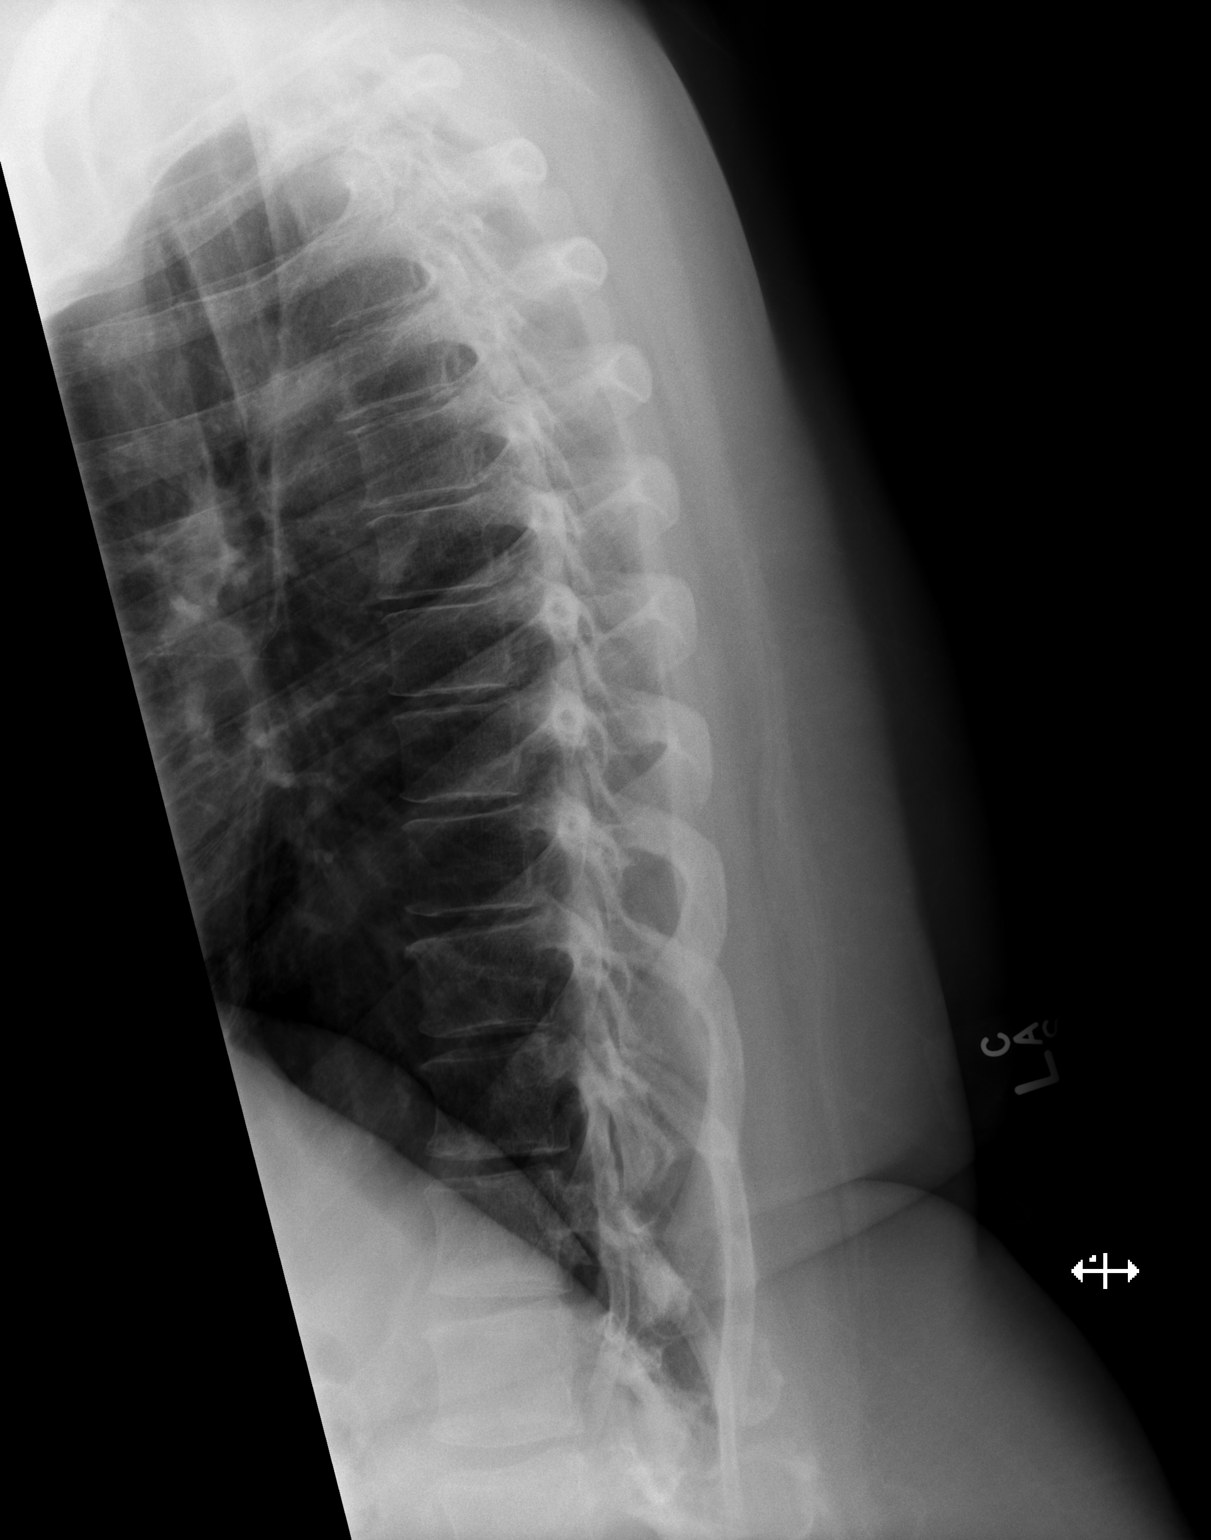

[3 of 3 positions shown; findings below may reference images not displayed]

FINDINGS: There is no evidence of thoracic spine fracture. There is scoliosis.
There are mild anterior osteophytosis is in the cervical spine.
IMPRESSION: No acute fracture or dislocation.

## 2014-05-14 IMAGING — CR DG CERVICAL SPINE COMPLETE 4+V
6 series · 6 of 6 positions shown · non-contrast
Comparison: None.

CLINICAL DATA: Status post fall 1 week ago, neck pain

EXAM:
CERVICAL SPINE  4+ VIEWS

[w cervical spine lat]
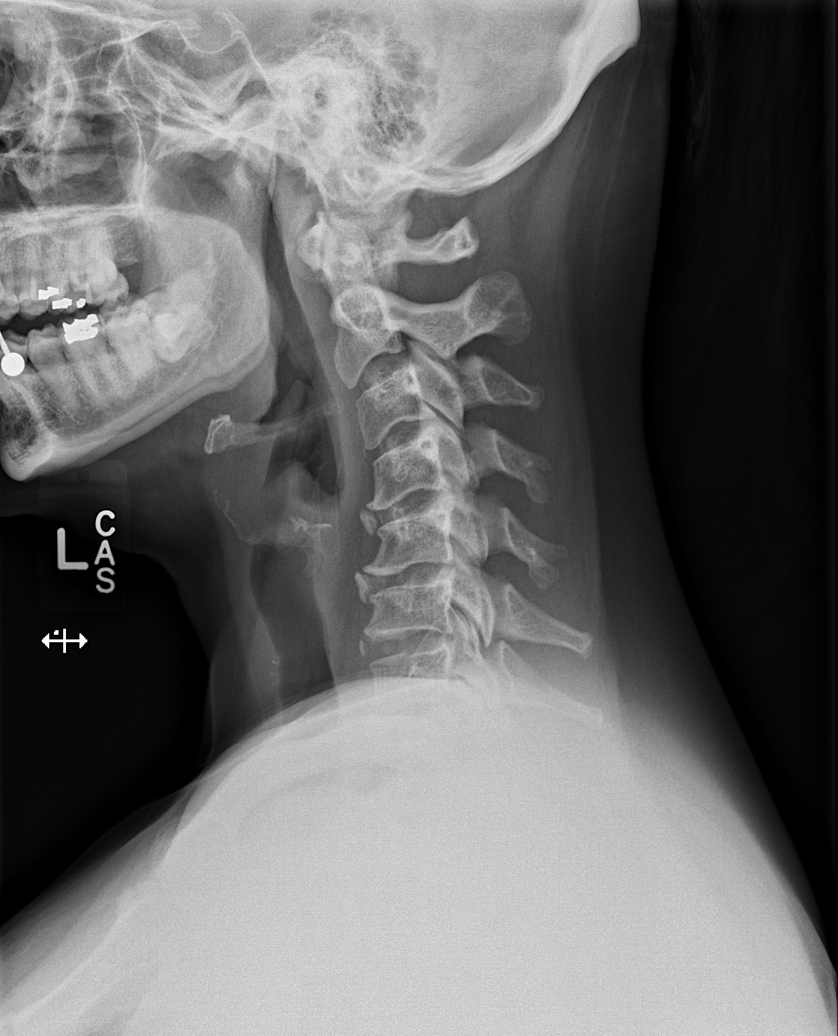

[w cervical swimmers]
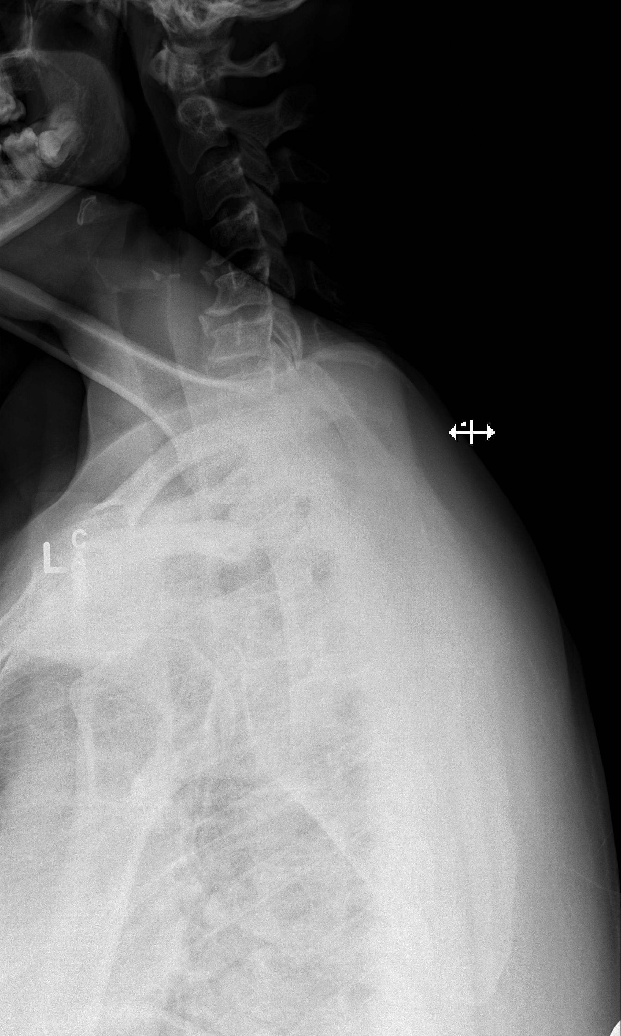

[w cervical spine ap_obl (1 of 2)]
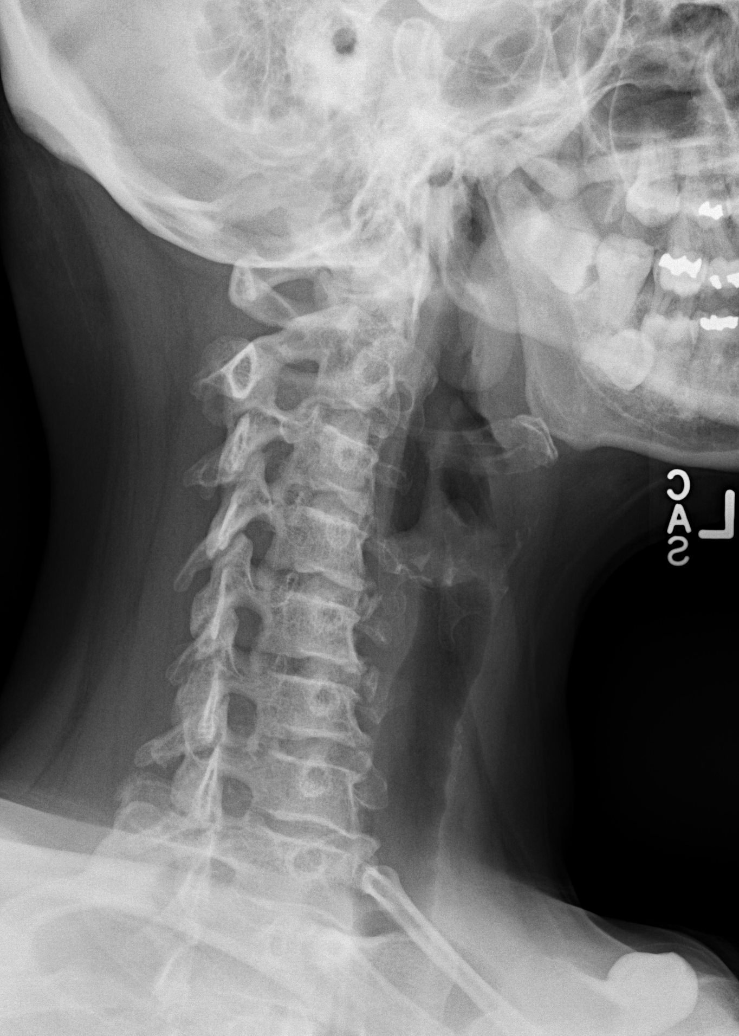

[w cervical spine ap_obl (2 of 2)]
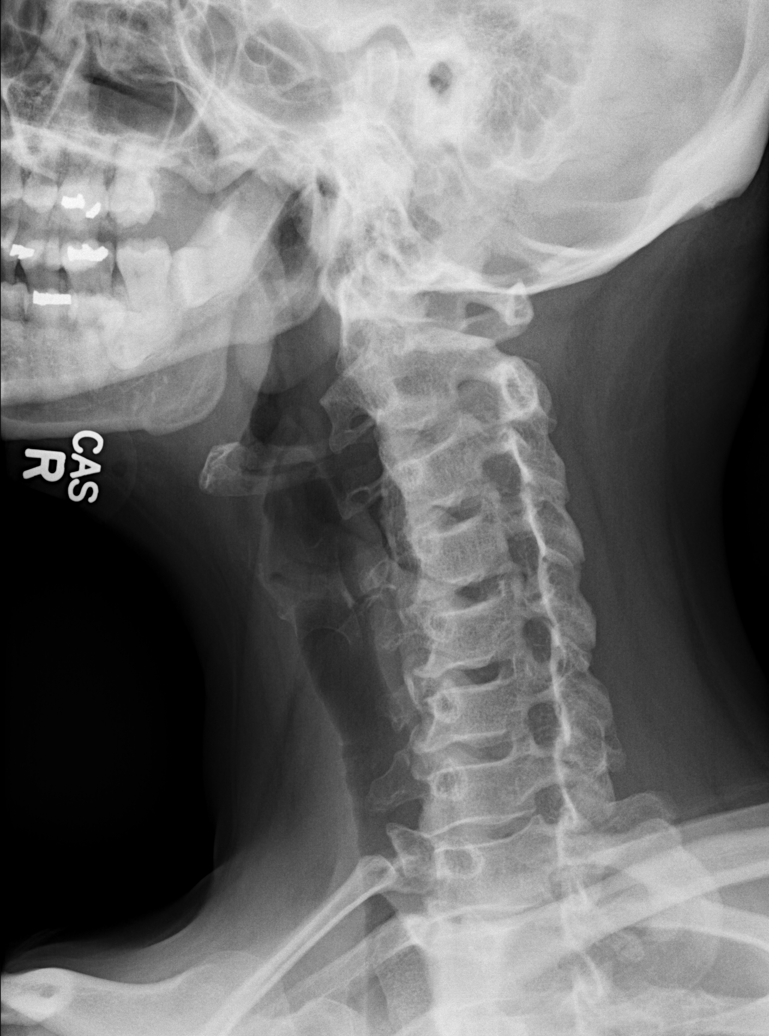

[w cervical spine ap]
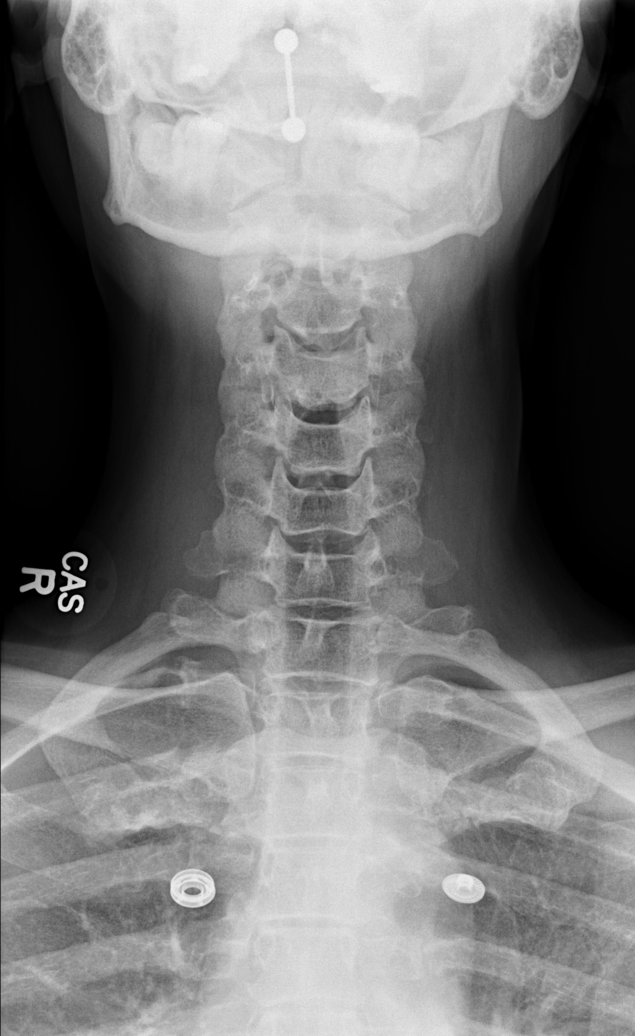

[w cervical spine odontoid]
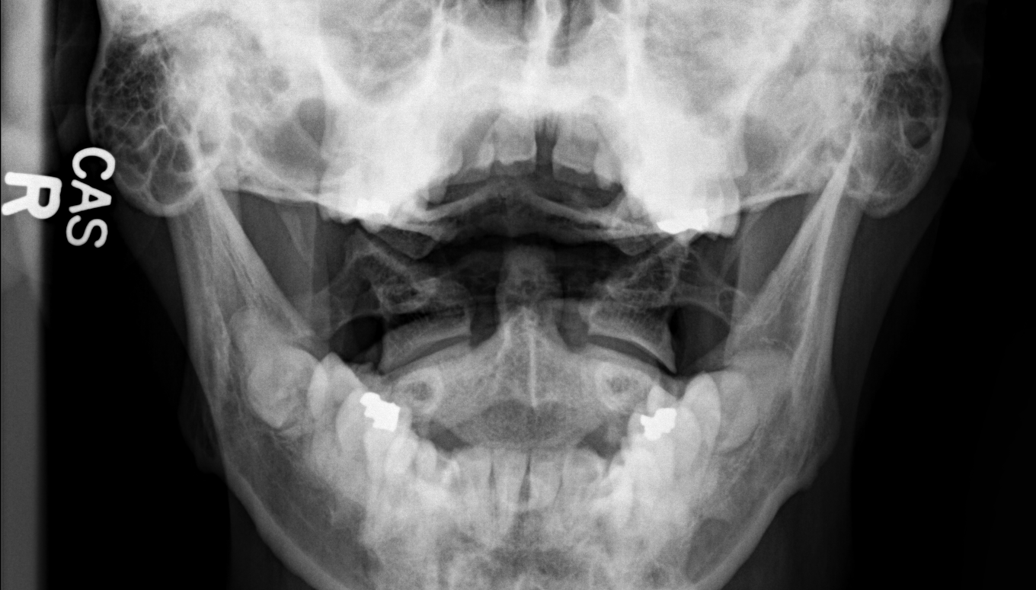

[6 of 6 positions shown; findings below may reference images not displayed]

FINDINGS: There is no evidence of cervical spine fracture or prevertebral soft
tissue swelling. There is kyphosis of cervical spine. Degenerative
joint changes of cervical spine with anterior osteophytosis are
identified. There is narrowing of the right-sided C3- 4, C4 -5,
left-sided C4-5, C5-6 neural foramina due to osteophyte
encroachment.
IMPRESSION: No acute fracture or dislocation. Degenerative joint changes of
cervical spine.

## 2014-06-11 ENCOUNTER — Encounter (HOSPITAL_COMMUNITY): Payer: Self-pay

## 2014-06-11 ENCOUNTER — Emergency Department (HOSPITAL_COMMUNITY): Payer: Medicare Other

## 2014-06-11 ENCOUNTER — Emergency Department (HOSPITAL_COMMUNITY)
Admission: EM | Admit: 2014-06-11 | Discharge: 2014-06-11 | Disposition: A | Discharge status: Home / Self Care | Payer: Medicare Other | Attending: Emergency Medicine | Admitting: Emergency Medicine

## 2014-06-11 DIAGNOSIS — Z79899 Other long term (current) drug therapy: Secondary | ICD-10-CM | POA: Insufficient documentation

## 2014-06-11 DIAGNOSIS — R110 Nausea: Secondary | ICD-10-CM | POA: Diagnosis not present

## 2014-06-11 DIAGNOSIS — M5430 Sciatica, unspecified side: Secondary | ICD-10-CM | POA: Insufficient documentation

## 2014-06-11 DIAGNOSIS — I10 Essential (primary) hypertension: Secondary | ICD-10-CM | POA: Diagnosis not present

## 2014-06-11 DIAGNOSIS — Z8742 Personal history of other diseases of the female genital tract: Secondary | ICD-10-CM | POA: Insufficient documentation

## 2014-06-11 DIAGNOSIS — R0789 Other chest pain: Secondary | ICD-10-CM | POA: Diagnosis not present

## 2014-06-11 DIAGNOSIS — R079 Chest pain, unspecified: Secondary | ICD-10-CM | POA: Diagnosis present

## 2014-06-11 DIAGNOSIS — M549 Dorsalgia, unspecified: Secondary | ICD-10-CM | POA: Diagnosis not present

## 2014-06-11 DIAGNOSIS — G8929 Other chronic pain: Secondary | ICD-10-CM | POA: Diagnosis not present

## 2014-06-11 DIAGNOSIS — F319 Bipolar disorder, unspecified: Secondary | ICD-10-CM | POA: Insufficient documentation

## 2014-06-11 DIAGNOSIS — R1013 Epigastric pain: Secondary | ICD-10-CM | POA: Diagnosis not present

## 2014-06-11 DIAGNOSIS — G43909 Migraine, unspecified, not intractable, without status migrainosus: Secondary | ICD-10-CM | POA: Insufficient documentation

## 2014-06-11 DIAGNOSIS — Z720 Tobacco use: Secondary | ICD-10-CM | POA: Diagnosis not present

## 2014-06-11 LAB — LIPASE, BLOOD: LIPASE: 21 U/L (ref 11–59)

## 2014-06-11 LAB — CBC
HCT: 35.5 % — ABNORMAL LOW (ref 36.0–46.0)
Hemoglobin: 12 g/dL (ref 12.0–15.0)
MCH: 25.7 pg — AB (ref 26.0–34.0)
MCHC: 33.8 g/dL (ref 30.0–36.0)
MCV: 76 fL — ABNORMAL LOW (ref 78.0–100.0)
Platelets: 336 10*3/uL (ref 150–400)
RBC: 4.67 MIL/uL (ref 3.87–5.11)
RDW: 16.9 % — ABNORMAL HIGH (ref 11.5–15.5)
WBC: 7.3 10*3/uL (ref 4.0–10.5)

## 2014-06-11 LAB — HEPATIC FUNCTION PANEL
ALK PHOS: 88 U/L (ref 39–117)
ALT: 7 U/L (ref 0–35)
AST: 15 U/L (ref 0–37)
Albumin: 3.3 g/dL — ABNORMAL LOW (ref 3.5–5.2)
Total Bilirubin: 0.4 mg/dL (ref 0.3–1.2)
Total Protein: 6.1 g/dL (ref 6.0–8.3)

## 2014-06-11 LAB — BASIC METABOLIC PANEL
ANION GAP: 5 (ref 5–15)
BUN: 10 mg/dL (ref 6–23)
CALCIUM: 8.7 mg/dL (ref 8.4–10.5)
CO2: 23 mmol/L (ref 19–32)
Chloride: 111 mEq/L (ref 96–112)
Creatinine, Ser: 0.5 mg/dL (ref 0.50–1.10)
GFR calc Af Amer: >90 mL/min (ref >90)
GLUCOSE: 75 mg/dL (ref 70–99)
Potassium: 3.7 mmol/L (ref 3.5–5.1)
Sodium: 139 mmol/L (ref 135–145)

## 2014-06-11 LAB — D-DIMER, QUANTITATIVE (NOT AT ARMC)

## 2014-06-11 LAB — POC OCCULT BLOOD, ED: Fecal Occult Bld: NEGATIVE

## 2014-06-11 LAB — I-STAT CG4 LACTIC ACID, ED: Lactic Acid, Venous: 1.21 mmol/L (ref 0.5–2.2)

## 2014-06-11 LAB — I-STAT TROPONIN, ED: TROPONIN I, POC: 0 ng/mL (ref 0.00–0.08)

## 2014-06-11 MED ORDER — RANITIDINE HCL 150 MG PO TABS: 150.0000 mg | ORAL_TABLET | Freq: Two times a day (BID) | ORAL | Status: DC

## 2014-06-11 MED ORDER — FAMOTIDINE IN NACL 20-0.9 MG/50ML-% IV SOLN
20.0000 mg | Freq: Once | INTRAVENOUS | Status: AC
  Administered 2014-06-11: 20 mg via INTRAVENOUS
  Filled 2014-06-11: qty 50

## 2014-06-11 MED ORDER — HYDROMORPHONE HCL 1 MG/ML IJ SOLN
1.0000 mg | Freq: Once | INTRAMUSCULAR | Status: AC
  Administered 2014-06-11: 1 mg via INTRAVENOUS
  Filled 2014-06-11: qty 1

## 2014-06-11 MED ORDER — ONDANSETRON HCL 4 MG/2ML IJ SOLN
4.0000 mg | Freq: Once | INTRAMUSCULAR | Status: AC
  Administered 2014-06-11: 4 mg via INTRAVENOUS
  Filled 2014-06-11: qty 2

## 2014-06-11 MED ORDER — SUCRALFATE 1 GM/10ML PO SUSP: 1.0000 g | Freq: Three times a day (TID) | ORAL | Status: DC

## 2014-06-11 MED ORDER — GI COCKTAIL ~~LOC~~
30.0000 mL | Freq: Once | ORAL | Status: AC
  Administered 2014-06-11: 30 mL via ORAL
  Filled 2014-06-11: qty 30

## 2014-06-11 MED ORDER — HYDROCODONE-ACETAMINOPHEN 5-325 MG PO TABS: 2.0000 | ORAL_TABLET | ORAL | Status: DC | PRN

## 2014-06-11 NOTE — ED Notes (Signed)
Per pt, chest pain under left breast and radiating to the back.  Pain started last night.  Denies cough, congestion, fever, change in urination or bowels, cardiac history, fever...  Pt states she is nauseated with no vomiting.

## 2014-06-11 NOTE — ED Provider Notes (Signed)
CSN: [id number]     Arrival date & time 06/11/14  1147 History   First MD Initiated Contact with Patient 06/11/14 1157     Chief Complaint  Patient presents with  . Chest Pain  . Back Pain     (Consider location/radiation/quality/duration/timing/severity/associated sxs/prior Treatment) HPI Comments: Patient presents to the ER for evaluation of left-sided chest pain that began last night. Patient reports that the pain has been persistent and worsening through the night. It started as a sharp pain under her left breast and now radiates into her back. She reports that it hurts to move but is very painful when she takes a deep breath. She has not had any fever, cough or chest congestion. Patient reports that she has had a history of bleeding gastric ulcers in the past, has not had any melena.  Patient is a 39 y.o. female presenting with chest pain and back pain.  Chest Pain Associated symptoms: abdominal pain, back pain and nausea   Back Pain Associated symptoms: abdominal pain and chest pain     Past Medical History  Diagnosis Date  . Hypertension   . Chronic back pain   . Migraine headache   . Stress incontinence   . Sciatica   . Bipolar disorder    Past Surgical History  Procedure Laterality Date  . Fracture surgery    . Back surgery    . Cesarean section    . Gastric bypass    . Ankle surgery    . Lumbar disc surgery    . Lumbar fusion    . Tubal ligation     Family History  Problem Relation Age of Onset  . Diabetes Mother   . Hypertension Mother   . Diabetes Father   . Hypertension Father    History  Substance Use Topics  . Smoking status: Current Every Day Smoker -- 1.00 packs/day    Types: Cigarettes  . Smokeless tobacco: Never Used  . Alcohol Use: No   OB History    No data available     Review of Systems  Cardiovascular: Positive for chest pain.  Gastrointestinal: Positive for nausea and abdominal pain. Negative for diarrhea.  Musculoskeletal:  Positive for back pain.  All other systems reviewed and are negative.     Allergies  Ketorolac tromethamine and Methocarbamol  Home Medications   Prior to Admission medications   Medication Sig Start Date End Date Taking? Authorizing Provider  acetaminophen (TYLENOL) 500 MG tablet Take 1,000 mg by mouth every 6 (six) hours as needed for moderate pain (pain).   Yes Historical Provider, MD  amitriptyline (ELAVIL) 75 MG tablet Take 1 tablet (75 mg total) by mouth at bedtime. 02/11/14  Yes Waylan Boga, NP  amLODipine (NORVASC) 10 MG tablet Take 10 mg by mouth daily.   Yes Historical Provider, MD  gabapentin (NEURONTIN) 300 MG capsule Take 1 capsule (300 mg total) by mouth 3 (three) times daily. 02/11/14  Yes Waylan Boga, NP  ibuprofen (ADVIL,MOTRIN) 200 MG tablet Take 800 mg by mouth every 6 (six) hours as needed for moderate pain (pain).    Yes Historical Provider, MD  amLODipine (NORVASC) 5 MG tablet Take 1 tablet (5 mg total) by mouth daily. Patient not taking: Reported on 05/05/2014 02/11/14   Waylan Boga, NP  baclofen (LIORESAL) 10 MG tablet Take 1 tablet (10 mg total) by mouth 3 (three) times daily. Patient not taking: Reported on 05/05/2014 02/11/14   Waylan Boga, NP  calcium carbonate (TUMS EX)  750 MG chewable tablet Chew 4 tablets (3,000 mg total) by mouth daily. Patient not taking: Reported on 05/05/2014 02/11/14   Waylan Boga, NP  cetirizine (ZYRTEC) 10 MG tablet Take 1 tablet (10 mg total) by mouth daily. Patient not taking: Reported on 05/05/2014 02/11/14   Waylan Boga, NP  DULoxetine (CYMBALTA) 60 MG capsule Take 1 capsule (60 mg total) by mouth daily. Patient not taking: Reported on 05/05/2014 02/11/14   Waylan Boga, NP  HYDROcodone-acetaminophen (NORCO/VICODIN) 5-325 MG per tablet Take 1-2 tablets by mouth every 4 (four) hours as needed for moderate pain or severe pain. 05/05/14   Verda Cumins Dansie, PA-C  hydrOXYzine (ATARAX/VISTARIL) 25 MG tablet Take 1 tablet (25 mg  total) by mouth every 6 (six) hours as needed for anxiety. 02/11/14   Waylan Boga, NP  ibuprofen (ADVIL,MOTRIN) 600 MG tablet Take 1 tablet (600 mg total) by mouth every 6 (six) hours as needed. 04/20/14   Verda Cumins Dansie, PA-C  sucralfate (CARAFATE) 1 G tablet Take 1 tablet (1 g total) by mouth 4 (four) times daily. Patient not taking: Reported on 05/05/2014 02/11/14   Waylan Boga, NP   BP 144/100 mmHg  Pulse 106  Temp(Src) 99.1 F (37.3 C) (Oral)  Resp 15  SpO2 99%  LMP 05/24/2014 Physical Exam  Constitutional: She is oriented to person, place, and time. She appears well-developed and well-nourished. No distress.  HENT:  Head: Normocephalic and atraumatic.  Right Ear: Hearing normal.  Left Ear: Hearing normal.  Nose: Nose normal.  Mouth/Throat: Oropharynx is clear and moist and mucous membranes are normal.  Eyes: Conjunctivae and EOM are normal. Pupils are equal, round, and reactive to light.  Neck: Normal range of motion. Neck supple.  Cardiovascular: Regular rhythm, S1 normal and S2 normal.  Exam reveals no gallop and no friction rub.   No murmur heard. Pulmonary/Chest: Effort normal and breath sounds normal. No respiratory distress. She exhibits no tenderness.  Abdominal: Soft. Normal appearance and bowel sounds are normal. There is no hepatosplenomegaly. There is tenderness in the epigastric area. There is no rebound, no guarding, no tenderness at McBurney's point and negative Murphy's sign. No hernia.  Musculoskeletal: Normal range of motion.  Neurological: She is alert and oriented to person, place, and time. She has normal strength. No cranial nerve deficit or sensory deficit. Coordination normal. GCS eye subscore is 4. GCS verbal subscore is 5. GCS motor subscore is 6.  Skin: Skin is warm, dry and intact. No rash noted. No cyanosis.  Psychiatric: She has a normal mood and affect. Her speech is normal and behavior is normal. Thought content normal.  Nursing note and  vitals reviewed.   ED Course  Procedures (including critical care time) Labs Review Labs Reviewed  CBC - Abnormal; Notable for the following:    HCT 35.5 (*)    MCV 76.0 (*)    MCH 25.7 (*)    RDW 16.9 (*)    All other components within normal limits  D-DIMER, QUANTITATIVE  BASIC METABOLIC PANEL  LIPASE, BLOOD  HEPATIC FUNCTION PANEL  I-STAT TROPOININ, ED  I-STAT CG4 LACTIC ACID, ED  POC OCCULT BLOOD, ED    Imaging Review Dg Chest 2 View  06/11/2014   CLINICAL DATA:  Mid sided left chest pain radiating toward back. Nausea  EXAM: CHEST  2 VIEW  COMPARISON:  March 18, 2013  FINDINGS: Lungs are clear. Heart size and pulmonary vascularity are normal. No adenopathy. No pneumothorax. No bone lesions.  IMPRESSION: No edema  or consolidation.   Electronically Signed   By: Lowella Grip M.D.   On: 06/11/2014 12:22     EKG Interpretation   Date/Time:  Tuesday June 11 2014 11:56:13 EST Ventricular Rate:  102 PR Interval:  153 QRS Duration: 80 QT Interval:  329 QTC Calculation: 428 R Axis:   75 Text Interpretation:  Sinus tachycardia Left atrial enlargement  Nonspecific T abnormalities, lateral leads Confirmed by Kyngston Pickelsimer  MD,  Jaun Galluzzo [phone]) on 06/11/2014 12:02:33 PM      MDM   Final diagnoses:  Chest pain    Patient presents to the ER for evaluation of chest pain. Patient reports pain that again under her left breast and then started to radiate into her back. Pain is sharp in nature and related to movement. Patient has severe worsening of pain when she bends or twists. Symptoms present since last night. EKG does not show any acute ischemia or infarct. Troponin is negative. Since the pain has been present since last night, I do not feel that she requires any further troponins, symptoms are very atypical for cardiac pain. She also has a negative d-dimer. No suspicion for PE based on this. Heart and aortic contours are normal chest x-ray, low suspicion for aortic  dissection. Patient did complain of some abdominal pain initially, but this has completely resolved. She is not experiencing vomiting. She has had a previous gastric bypass, but do not suspect internal hernia or obstruction based on her current examination and complete resolution of abdominal discomfort. Pain in the chest resolved after Dilaudid, starting to come back. It is very reproducible on examination. She does have a history of gastric ulcer. Fecal occult negative today. She does not have any anemia. Treated with Pepcid and GI cocktail.  Will refer the patient to gastroenterology for further evaluation, she has had history of peptic ulcer disease in the past and does not have a local gastroenterologist. She reports that she does have a primary care doctor, will refer back to primary care for repeat evaluation as well. Patient counseled return to the ER for symptoms worsen.    Orpah Greek, MD 06/11/14 [phone]

## 2014-06-11 NOTE — Discharge Instructions (Signed)

## 2014-07-07 ENCOUNTER — Emergency Department (HOSPITAL_COMMUNITY)
Admission: EM | Admit: 2014-07-07 | Discharge: 2014-07-07 | Disposition: A | Discharge status: Home / Self Care | Payer: Medicare Other | Attending: Emergency Medicine | Admitting: Emergency Medicine

## 2014-07-07 ENCOUNTER — Encounter (HOSPITAL_COMMUNITY): Payer: Self-pay | Admitting: Emergency Medicine

## 2014-07-07 ENCOUNTER — Emergency Department (HOSPITAL_COMMUNITY): Payer: Medicare Other

## 2014-07-07 DIAGNOSIS — G43909 Migraine, unspecified, not intractable, without status migrainosus: Secondary | ICD-10-CM | POA: Diagnosis not present

## 2014-07-07 DIAGNOSIS — R0789 Other chest pain: Secondary | ICD-10-CM | POA: Diagnosis not present

## 2014-07-07 DIAGNOSIS — Z8739 Personal history of other diseases of the musculoskeletal system and connective tissue: Secondary | ICD-10-CM | POA: Diagnosis not present

## 2014-07-07 DIAGNOSIS — G8929 Other chronic pain: Secondary | ICD-10-CM | POA: Insufficient documentation

## 2014-07-07 DIAGNOSIS — Z720 Tobacco use: Secondary | ICD-10-CM | POA: Diagnosis not present

## 2014-07-07 DIAGNOSIS — I10 Essential (primary) hypertension: Secondary | ICD-10-CM | POA: Insufficient documentation

## 2014-07-07 DIAGNOSIS — Z79899 Other long term (current) drug therapy: Secondary | ICD-10-CM | POA: Insufficient documentation

## 2014-07-07 DIAGNOSIS — Z8659 Personal history of other mental and behavioral disorders: Secondary | ICD-10-CM | POA: Diagnosis not present

## 2014-07-07 DIAGNOSIS — R079 Chest pain, unspecified: Secondary | ICD-10-CM | POA: Diagnosis present

## 2014-07-07 MED ORDER — HYDROCODONE-ACETAMINOPHEN 5-325 MG PO TABS
2.0000 | ORAL_TABLET | Freq: Once | ORAL | Status: AC
  Administered 2014-07-07: 2 via ORAL
  Filled 2014-07-07: qty 2

## 2014-07-07 NOTE — ED Provider Notes (Addendum)
CSN: [id number]     Arrival date & time 07/07/14  1425 History   First MD Initiated Contact with Patient 07/07/14 1455     Chief Complaint  Patient presents with  . Chest Pain     (Consider location/radiation/quality/duration/timing/severity/associated sxs/prior Treatment) HPI Complains of left-sided chest pain radiating to left back onset 2 days ago after she fell while moving furniture in her home. Patient reports she lost her footing. She denies any shortness of breath denies other complaint. She treated herself with ibuprofen and Lortab from a prior prescription, without adequate relief. She was also treated with one sublingual nitroglycerin and 4 baby aspirin tablets by EMS. Pain is worse with  changing positions, specifically with moving her left arm. She denies any shortness of breath. No other associated symptoms. Past Medical History  Diagnosis Date  . Hypertension   . Chronic back pain   . Migraine headache   . Stress incontinence   . Sciatica   . Bipolar disorder    Past Surgical History  Procedure Laterality Date  . Fracture surgery    . Back surgery    . Cesarean section    . Gastric bypass    . Ankle surgery    . Lumbar disc surgery    . Lumbar fusion    . Tubal ligation     Family History  Problem Relation Age of Onset  . Diabetes Mother   . Hypertension Mother   . Diabetes Father   . Hypertension Father    History  Substance Use Topics  . Smoking status: Current Every Day Smoker -- 1.00 packs/day    Types: Cigarettes  . Smokeless tobacco: Never Used  . Alcohol Use: No   OB History    No data available     Review of Systems  Constitutional: Negative.   HENT: Negative.   Respiratory: Negative.   Cardiovascular: Positive for chest pain.  Gastrointestinal: Negative.   Musculoskeletal: Positive for back pain.  Skin: Negative.   Neurological: Negative.   Psychiatric/Behavioral: Negative.   All other systems reviewed and are  negative.     Allergies  Ketorolac tromethamine and Methocarbamol  Home Medications   Prior to Admission medications   Medication Sig Start Date End Date Taking? Authorizing Provider  acetaminophen (TYLENOL) 500 MG tablet Take 1,000 mg by mouth every 6 (six) hours as needed for moderate pain (pain).    Historical Provider, MD  amitriptyline (ELAVIL) 75 MG tablet Take 1 tablet (75 mg total) by mouth at bedtime. 02/11/14   Waylan Boga, NP  amLODipine (NORVASC) 10 MG tablet Take 10 mg by mouth daily.    Historical Provider, MD  amLODipine (NORVASC) 5 MG tablet Take 1 tablet (5 mg total) by mouth daily. Patient not taking: Reported on 05/05/2014 02/11/14   Waylan Boga, NP  baclofen (LIORESAL) 10 MG tablet Take 1 tablet (10 mg total) by mouth 3 (three) times daily. Patient not taking: Reported on 05/05/2014 02/11/14   Waylan Boga, NP  calcium carbonate (TUMS EX) 750 MG chewable tablet Chew 4 tablets (3,000 mg total) by mouth daily. Patient not taking: Reported on 05/05/2014 02/11/14   Waylan Boga, NP  gabapentin (NEURONTIN) 300 MG capsule Take 1 capsule (300 mg total) by mouth 3 (three) times daily. 02/11/14   Waylan Boga, NP  HYDROcodone-acetaminophen (NORCO/VICODIN) 5-325 MG per tablet Take 2 tablets by mouth every 4 (four) hours as needed for moderate pain. 06/11/14   Orpah Greek, MD  hydrOXYzine (ATARAX/VISTARIL) 25 MG  tablet Take 1 tablet (25 mg total) by mouth every 6 (six) hours as needed for anxiety. 02/11/14   Waylan Boga, NP  ibuprofen (ADVIL,MOTRIN) 200 MG tablet Take 800 mg by mouth every 6 (six) hours as needed for moderate pain (pain).     Historical Provider, MD  ibuprofen (ADVIL,MOTRIN) 600 MG tablet Take 1 tablet (600 mg total) by mouth every 6 (six) hours as needed. 04/20/14   Verda Cumins Dansie, PA-C  ranitidine (ZANTAC) 150 MG tablet Take 1 tablet (150 mg total) by mouth 2 (two) times daily. 06/11/14   Orpah Greek, MD  sucralfate (CARAFATE) 1 GM/10ML  suspension Take 10 mLs (1 g total) by mouth 4 (four) times daily -  with meals and at bedtime. 06/11/14   Orpah Greek, MD   BP 124/95 mmHg  Pulse 89  Temp(Src) 98.2 F (36.8 C) (Oral)  Resp 24  SpO2 96%  LMP 05/24/2014 Physical Exam  Constitutional: She is oriented to person, place, and time. She appears well-developed and well-nourished. She appears distressed.  Appears uncomfortable  HENT:  Head: Normocephalic and atraumatic.  Eyes: Conjunctivae are normal. Pupils are equal, round, and reactive to light.  Neck: Neck supple. No tracheal deviation present. No thyromegaly present.  Cardiovascular: Normal rate and regular rhythm.   No murmur heard. Pulmonary/Chest: Effort normal and breath sounds normal. She exhibits tenderness.  Chest tender left lateral aspect. No crepitance no flail no ecchymosis.  pain exacerbated when pain exacerbated when she sits up from a seated position  Abdominal: Soft. Bowel sounds are normal. She exhibits no distension. There is no tenderness.  Musculoskeletal: Normal range of motion. She exhibits no edema or tenderness.  Entire spine nontender.  Neurological: She is alert and oriented to person, place, and time. Coordination normal.  Gait normal  Skin: Skin is warm and dry. No rash noted.  Psychiatric: She has a normal mood and affect.  Nursing note and vitals reviewed.   ED Course  Procedures (including critical care time) Labs Review Labs Reviewed - No data to display  Imaging Review No results found.   EKG Interpretation   Date/Time:  Sunday July 07 2014 14:42:00 EST Ventricular Rate:  76 PR Interval:  160 QRS Duration: 78 QT Interval:  380 QTC Calculation: 427 R Axis:   79 Text Interpretation:  Normal sinus rhythm Normal ECG nonspecific t wave  changes resolved since last tracing Confirmed by KNAPP  MD-J, JON ([id number])  on 07/07/2014 2:51:49 PM     4:10 PM she reports no relief after treatment with Norco 2 tablets. X-ray  reviewed by me. Results for orders placed or performed during the hospital encounter of 06/11/14  CBC  Result Value Ref Range   WBC 7.3 4.0 - 10.5 K/uL   RBC 4.67 3.87 - 5.11 MIL/uL   Hemoglobin 12.0 12.0 - 15.0 g/dL   HCT 35.5 (L) 36.0 - 46.0 %   MCV 76.0 (L) 78.0 - 100.0 fL   MCH 25.7 (L) 26.0 - 34.0 pg   MCHC 33.8 30.0 - 36.0 g/dL   RDW 16.9 (H) 11.5 - 15.5 %   Platelets 336 150 - 400 K/uL  Basic metabolic panel  Result Value Ref Range   Sodium 139 135 - 145 mmol/L   Potassium 3.7 3.5 - 5.1 mmol/L   Chloride 111 96 - 112 mEq/L   CO2 23 19 - 32 mmol/L   Glucose, Bld 75 70 - 99 mg/dL   BUN 10 6 - 23 mg/dL  Creatinine, Ser 0.50 0.50 - 1.10 mg/dL   Calcium 8.7 8.4 - 10.5 mg/dL   GFR calc non Af Amer >90 >90 mL/min   GFR calc Af Amer >90 >90 mL/min   Anion gap 5 5 - 15  Lipase, blood  Result Value Ref Range   Lipase 21 11 - 59 U/L  Hepatic function panel  Result Value Ref Range   Total Protein 6.1 6.0 - 8.3 g/dL   Albumin 3.3 (L) 3.5 - 5.2 g/dL   AST 15 0 - 37 U/L   ALT 7 0 - 35 U/L   Alkaline Phosphatase 88 39 - 117 U/L   Total Bilirubin 0.4 0.3 - 1.2 mg/dL   Bilirubin, Direct <0.1 0.0 - 0.3 mg/dL   Indirect Bilirubin NOT CALCULATED 0.3 - 0.9 mg/dL  D-dimer, quantitative  Result Value Ref Range   D-Dimer, Quant <0.27 0.00 - 0.48 ug/mL-FEU  I-stat troponin, ED (not at [facility])  Result Value Ref Range   Troponin i, poc 0.00 0.00 - 0.08 ng/mL   Comment 3          I-Stat CG4 Lactic Acid, ED  Result Value Ref Range   Lactic Acid, Venous 1.21 0.5 - 2.2 mmol/L  POC occult blood, ED RN will collect  Result Value Ref Range   Fecal Occult Bld NEGATIVE NEGATIVE   Dg Chest 2 View  06/11/2014   CLINICAL DATA:  Mid sided left chest pain radiating toward back. Nausea  EXAM: CHEST  2 VIEW  COMPARISON:  March 18, 2013  FINDINGS: Lungs are clear. Heart size and pulmonary vascularity are normal. No adenopathy. No pneumothorax. No bone lesions.  IMPRESSION: No edema or consolidation.    Electronically Signed   By: Lowella Grip M.D.   On: 06/11/2014 12:22   Dg Ribs Unilateral W/chest Left  07/07/2014   CLINICAL DATA:  Left posterior rib and chest pain. Fall at home moving furniture. Initial encounter.  EXAM: LEFT RIBS AND CHEST - 3+ VIEW  COMPARISON:  05/1914  FINDINGS: No fracture or other bone lesions are seen involving the ribs. There is no evidence of pneumothorax or pleural effusion. Both lungs are clear. Heart size and mediastinal contours are within normal limits.  IMPRESSION: Negative.   Electronically Signed   By: Earle Gell M.D.   On: 07/07/2014 15:59    MDM  [facility] controlled substance reporting system reviewed by me. Patient receiving multiple prescriptions for opioids from multiple prescribers. No prescriptions to be written by me. She can take Tylenol or Advil for pain Plan follow-up Dr. Alyson Ingles. Suggest referral to pain clinic blood pressure recheck. Diagnosis #1 chest wall pain #2 elevated blood pressure  Final diagnoses:  None        Orlie Dakin, MD 07/07/14 1615  Orlie Dakin, MD 07/07/14 1615

## 2014-07-07 NOTE — Discharge Instructions (Signed)
Chest Wall Pain Take Tylenol or Advil as directed for pain. Ask Dr.McKenzie for referral to a pain clinic. Her blood pressure rechecked at your visit with Dr. Alyson Ingles scheduled for this week. Today's was elevated at 145/101 Chest wall pain is pain in or around the bones and muscles of your chest. It may take up to 6 weeks to get better. It may take longer if you must stay physically active in your work and activities.  CAUSES  Chest wall pain may happen on its own. However, it may be caused by:  A viral illness like the flu.  Injury.  Coughing.  Exercise.  Arthritis.  Fibromyalgia.  Shingles. HOME CARE INSTRUCTIONS   Avoid overtiring physical activity. Try not to strain or perform activities that cause pain. This includes any activities using your chest or your abdominal and side muscles, especially if heavy weights are used.  Put ice on the sore area.  Put ice in a plastic bag.  Place a towel between your skin and the bag.  Leave the ice on for 15-20 minutes per hour while awake for the first 2 days.  Only take over-the-counter or prescription medicines for pain, discomfort, or fever as directed by your caregiver. SEEK IMMEDIATE MEDICAL CARE IF:   Your pain increases, or you are very uncomfortable.  You have a fever.  Your chest pain becomes worse.  You have new, unexplained symptoms.  You have nausea or vomiting.  You feel sweaty or lightheaded.  You have a cough with phlegm (sputum), or you cough up blood. MAKE SURE YOU:   Understand these instructions.  Will watch your condition.  Will get help right away if you are not doing well or get worse. Document Released: 05/10/2005 Document Revised: 08/02/2011 Document Reviewed: 01/04/2011 [facility] Patient Information 2015 [place], [place]. This information is not intended to replace advice given to you by your health care provider. Make sure you discuss any questions you have with your health care provider.

## 2014-07-07 NOTE — ED Notes (Signed)
Patient is from home, Patient states she  fell Friday morning due to  Moving. Then,  on Saturday she  Stated she starting having back pain and chest pain radiating  to the neck lt arm. Patient has been given 324  ASA and 1 nitro. with EMS. Patient refused a Nitro on arrival but rates pain 9/10 in chest.

## 2014-08-10 IMAGING — CT CT ABD-PELV W/ CM
1 of 3 series · 14 of 32 positions shown, 19 images · IV contrast (OMNIPAQUE 300)
Comparison: None.

CLINICAL DATA: Epigastric pain, nausea, vomiting and history of
gastric bypass surgery.

EXAM:
CT ABDOMEN AND PELVIS WITH CONTRAST
TECHNIQUE: Multidetector CT imaging of the abdomen and pelvis was performed
using the standard protocol following bolus administration of
intravenous contrast.
CONTRAST:  50mL OMNIPAQUE IOHEXOL 300 MG/ML SOLN, 100mL OMNIPAQUE
IOHEXOL 300 MG/ML SOLN

[Series 2: abd/pel with · axial · 0.74mm/px · z∈[-420,+5]mm · 14 of 97 slices shown, 19 images]
[im 6/97  soft-tissue]
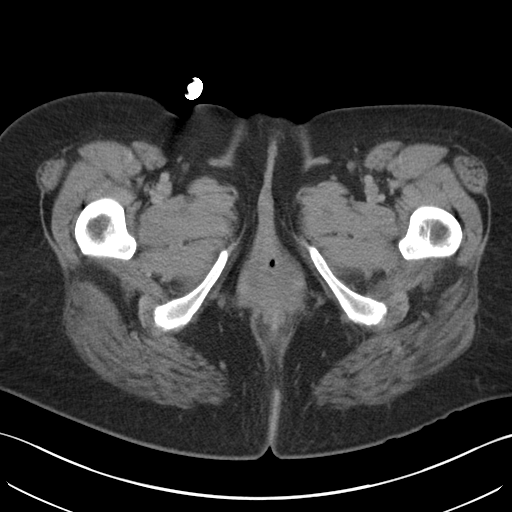
[im 6/97  bone]
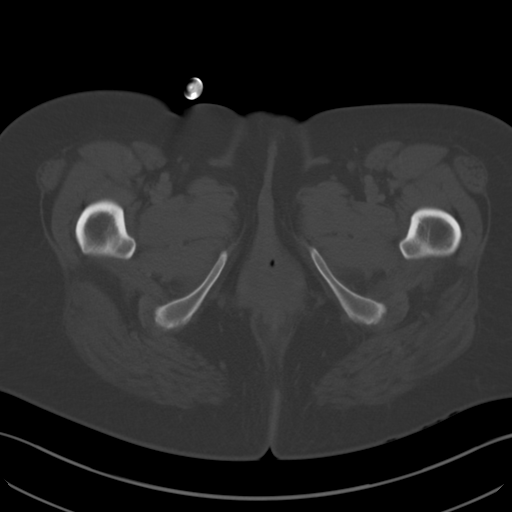
[im 11/97  soft-tissue]
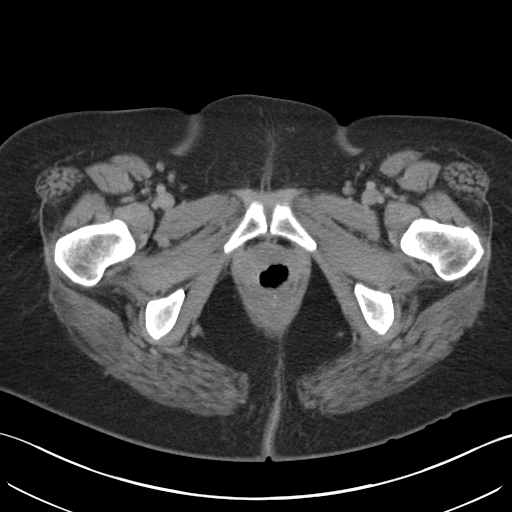
[im 22/97  soft-tissue]
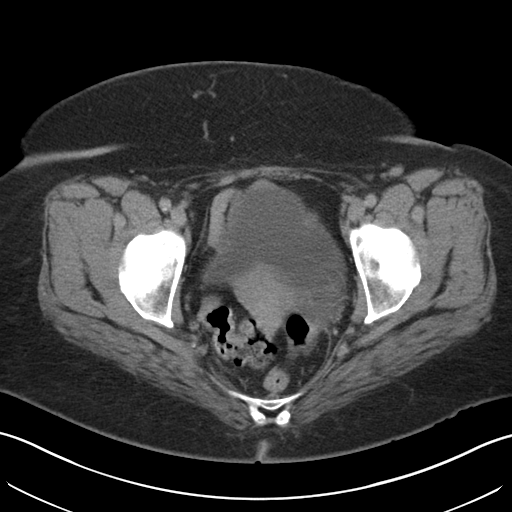
[im 27/97  soft-tissue]
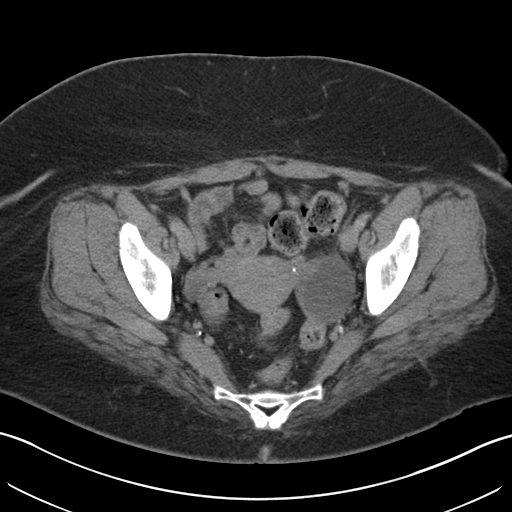
[im 33/97  soft-tissue]
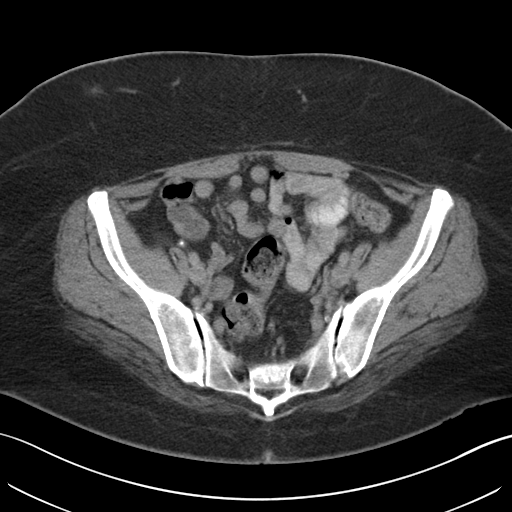
[im 43/97  soft-tissue]
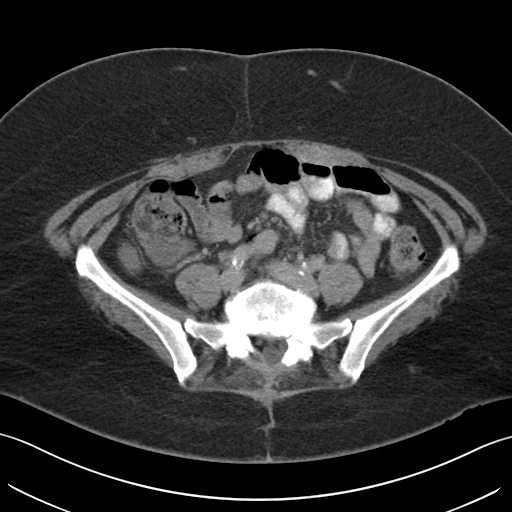
[im 49/97  soft-tissue]
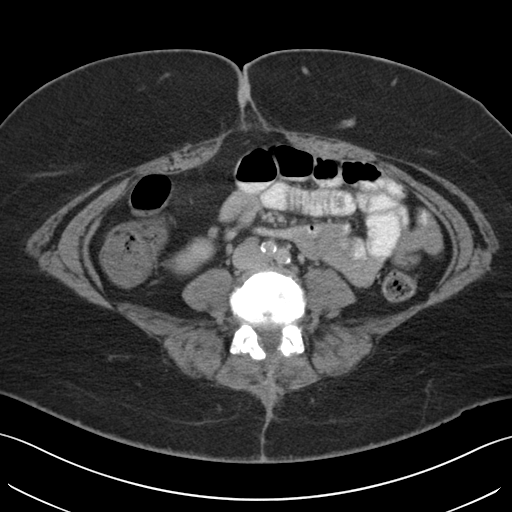
[im 54/97  soft-tissue]
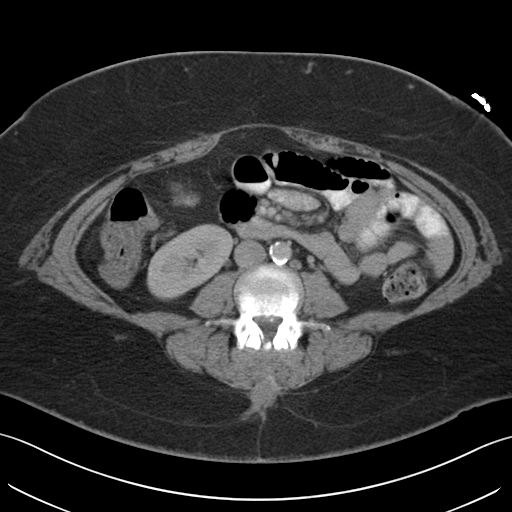
[im 65/97  soft-tissue]
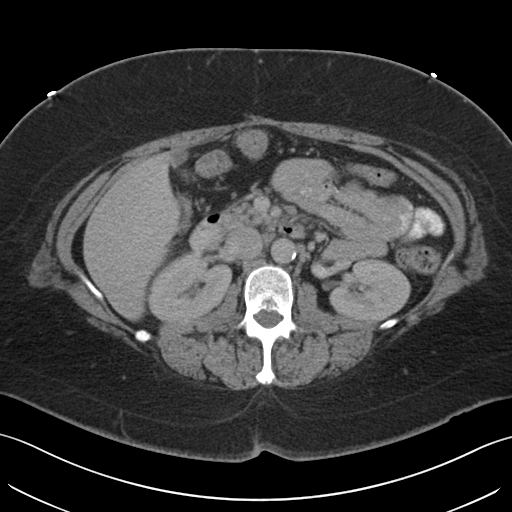
[im 65/97  bone]
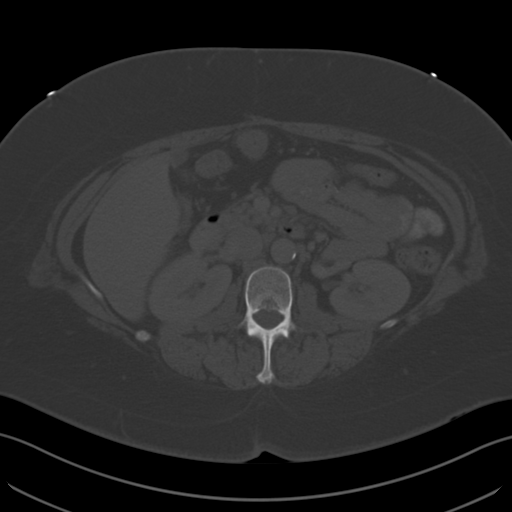
[im 70/97  soft-tissue]
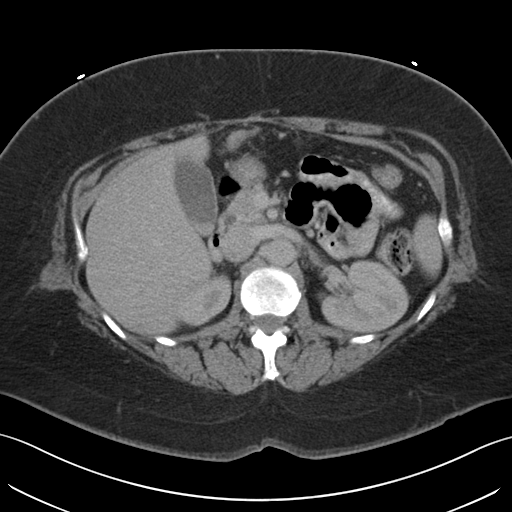
[im 75/97  soft-tissue]
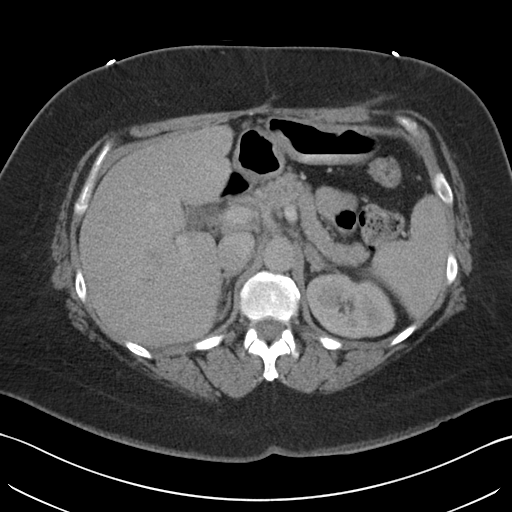
[im 75/97  lung]
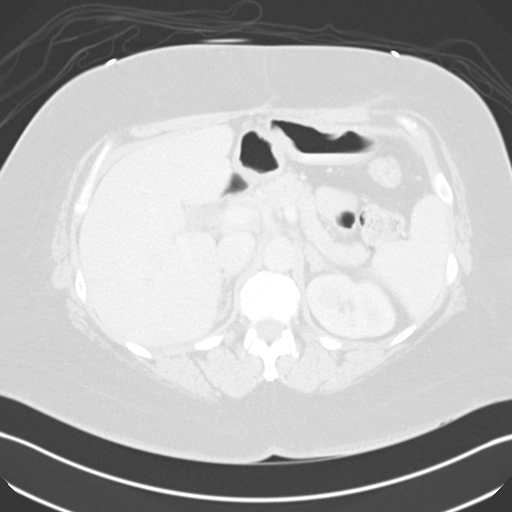
[im 81/97  lung]
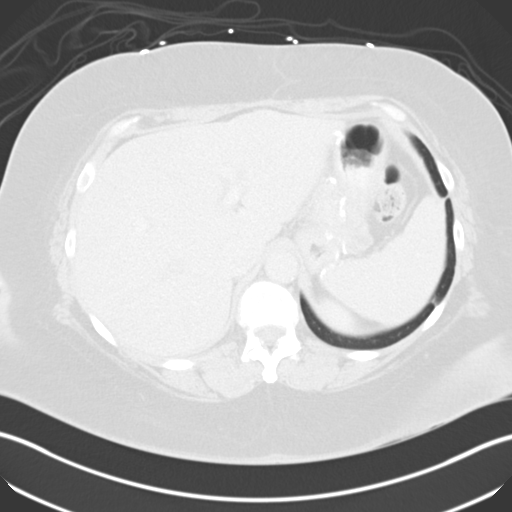
[im 86/97  soft-tissue]
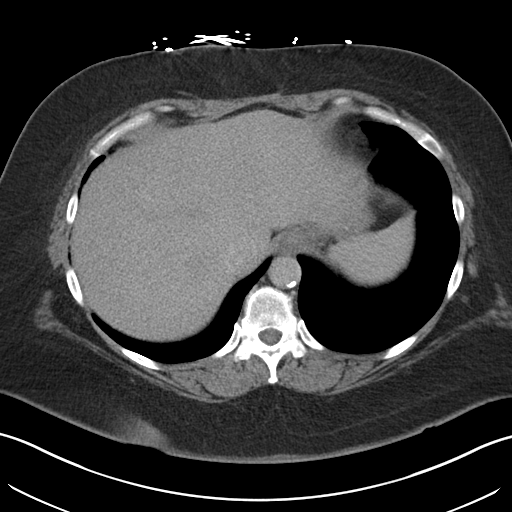
[im 86/97  lung]
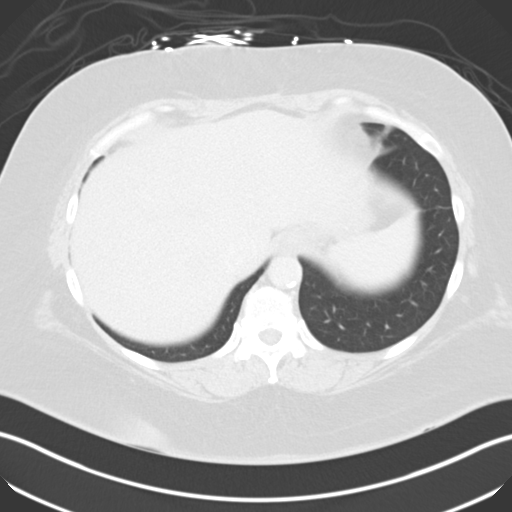
[im 91/97  soft-tissue]
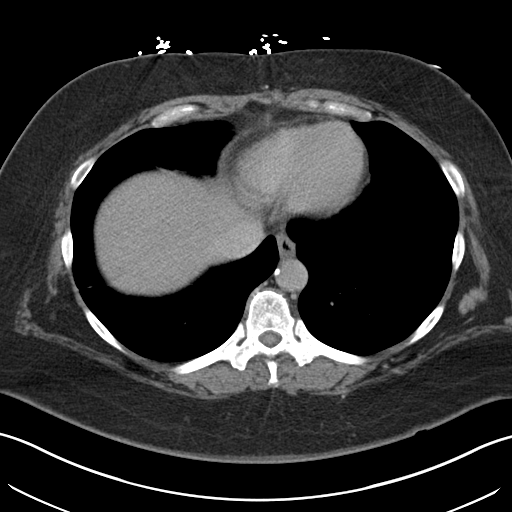
[im 91/97  lung]
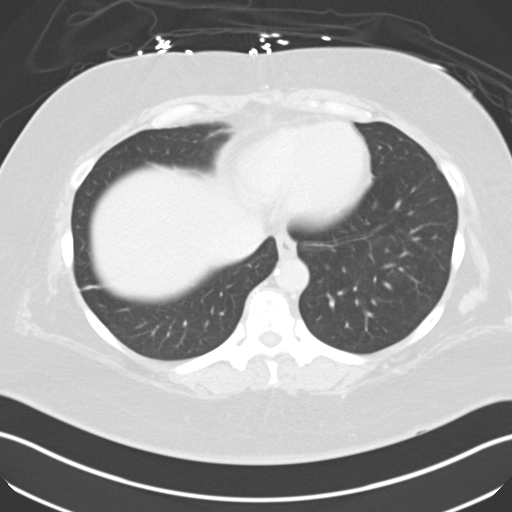

[14 of 32 positions shown; findings below may reference images not displayed]

FINDINGS: The patient is status post Roux-en-Y gastric bypass. There are no
signs of complication by CT. The gastric remnant is not distended.
There is no evidence of abnormal dilated bowel loops or leak. No
abnormal fluid collections are identified. No evidence of internal
hernia or bowel obstruction.

The liver, gallbladder, pancreas, spleen, adrenal glands and kidneys
are within normal limits. No masses or enlarged lymph nodes are
seen.

In the pelvis, cystic structure of the left adnexa measures
approximately 4.2 x 5.0 cm. This most likely represents a
physiologic cyst. The bladder is unremarkable. Degenerative
spondylosis of the lumbar spine present at L4-5 and L5-S1.
IMPRESSION: No acute findings. Intact appearing gastric bypass without evidence
of complication by CT. 5 cm left adnexal cyst is likely physiologic.

## 2014-09-09 ENCOUNTER — Other Ambulatory Visit: Payer: Self-pay | Admitting: Physician Assistant

## 2014-09-09 DIAGNOSIS — Z1231 Encounter for screening mammogram for malignant neoplasm of breast: Secondary | ICD-10-CM

## 2014-09-20 ENCOUNTER — Ambulatory Visit: Payer: Self-pay

## 2014-10-09 IMAGING — CR DG HAND COMPLETE 3+V*R*
3 series · 3 of 3 positions shown · non-contrast
Comparison: None.

CLINICAL DATA: Swelling right hand after IV placement. Initial
visit.

EXAM:
RIGHT HAND - COMPLETE 3+ VIEW

[x hand pa right]
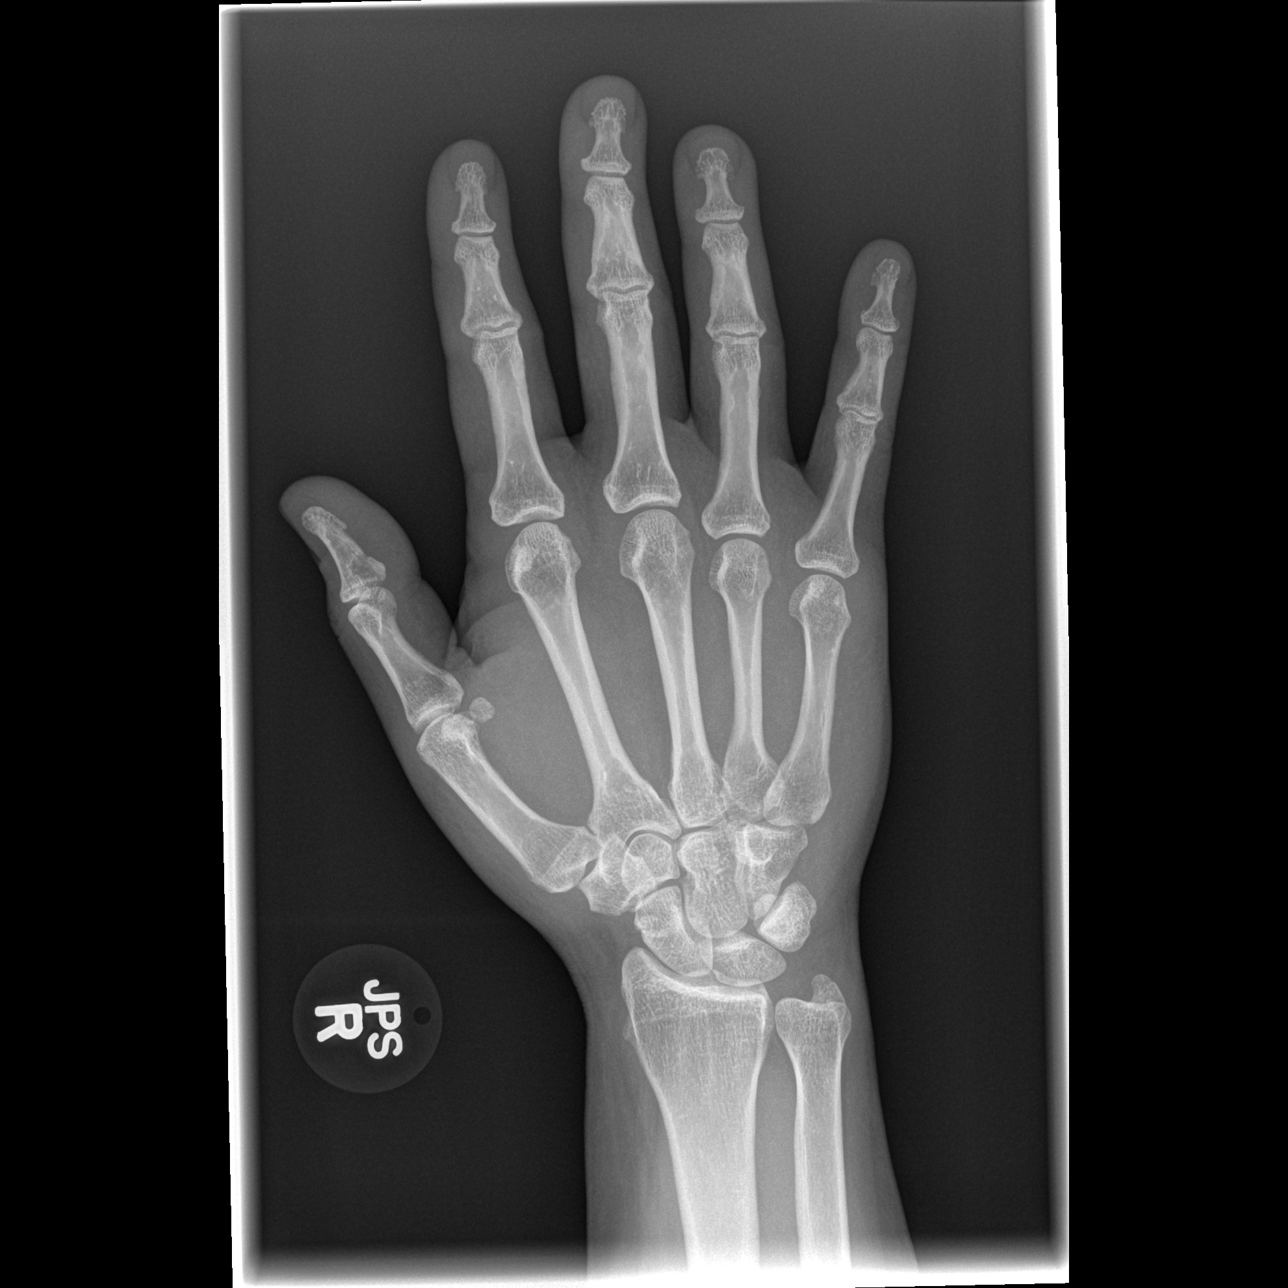

[x hand oblique right]
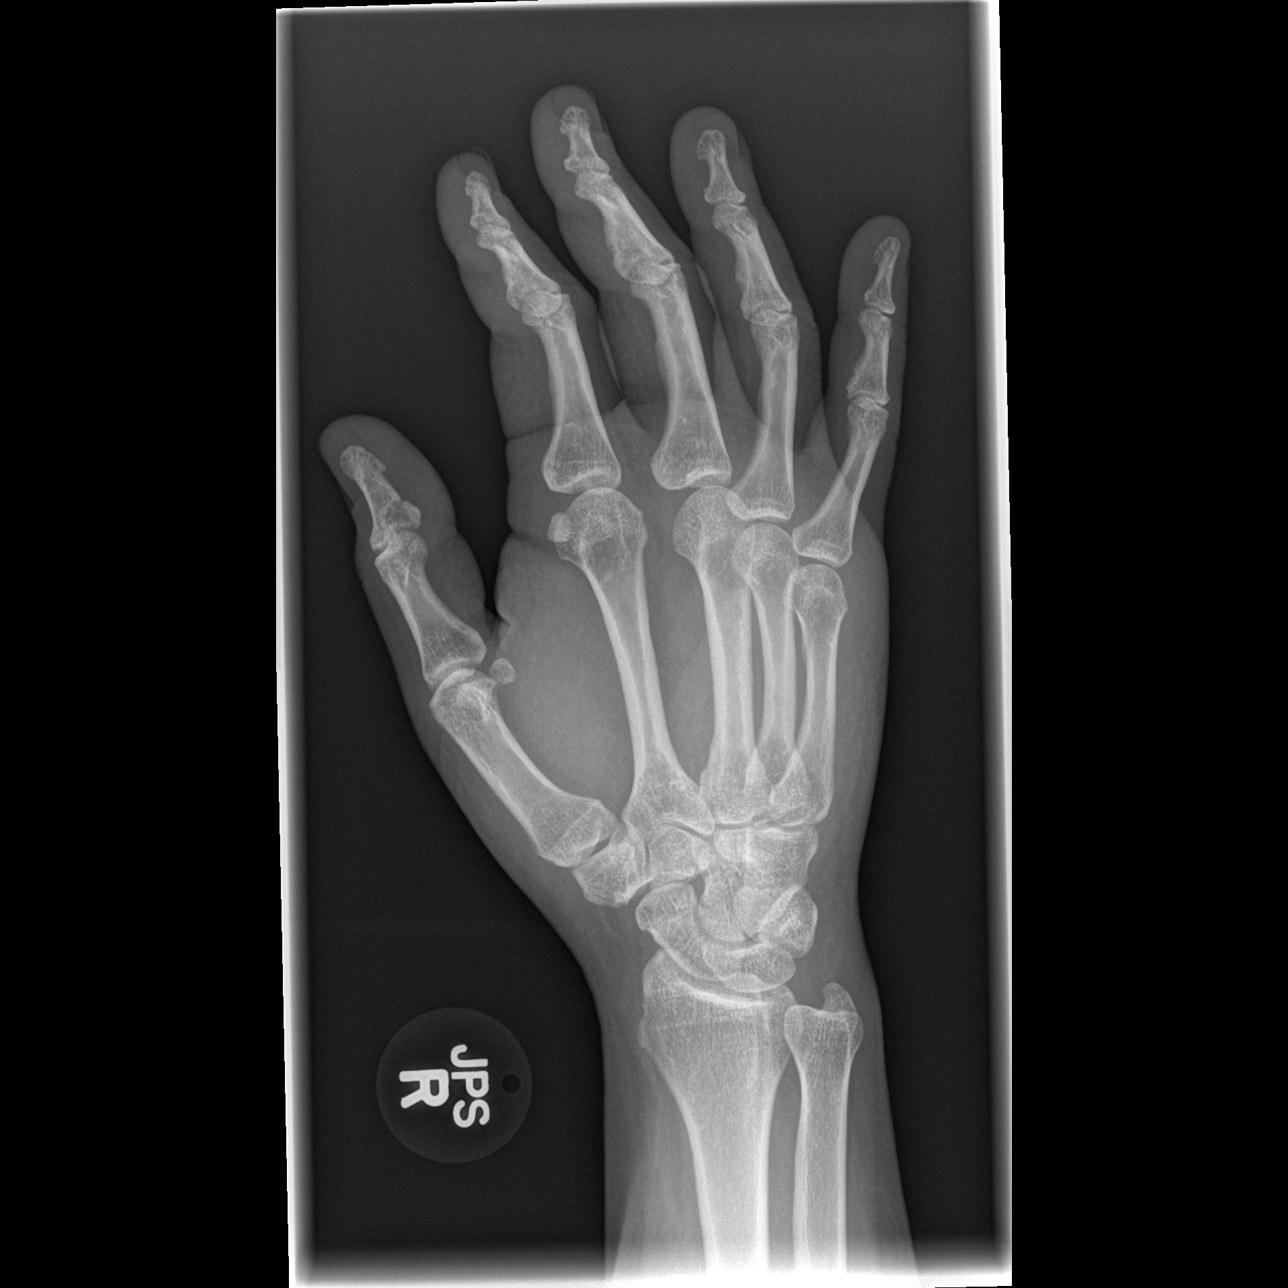

[x hand lat right]
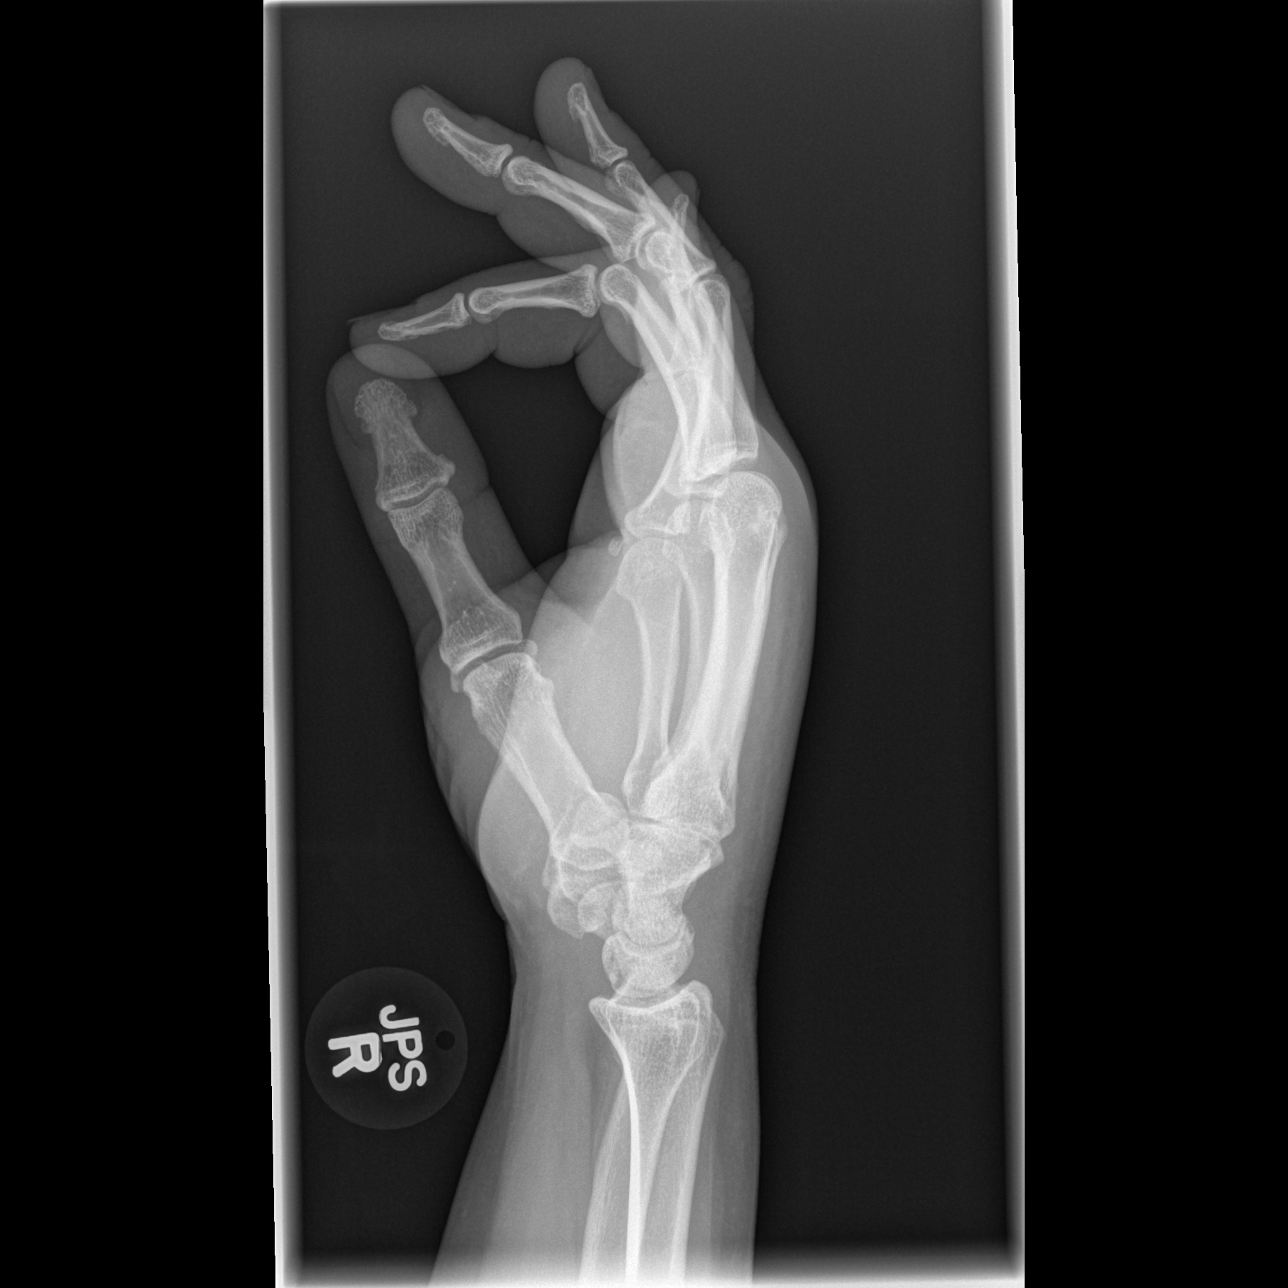

[3 of 3 positions shown; findings below may reference images not displayed]

FINDINGS: A corticated lytic lesion is noted in the metaphysis of the distal
phalanx of the right thumb. This may have calcified chondroid
matrix. This most likely represents an enchondroma. Inclusion cyst
from prior trauma could present in this fashion. No soft tissue
swelling noted to suggest infection. Other more ominous lesions such
as metastatic foci would be less likely. The patient has no prior
history of tumor. Follow-up imaging in 3 months to demonstrate
stability suggested.
IMPRESSION: Corticated lytic lesion in the metaphysis of the distal phalanx of
the right thumb noted. This most likely represents a small
enchondroma. Follow-up imaging in 3 months to demonstrate stability
suggested. No acute abnormality identified.

## 2014-12-16 IMAGING — CT CT NECK W/ CM
2 of 3 series · 8 of 14 positions shown, 9 images · IV contrast (OMNIPAQUE 300)
Comparison: None.

CLINICAL DATA: Severe neck pain and fever after tooth extraction.

EXAM:
CT NECK WITH CONTRAST
TECHNIQUE: Multidetector CT imaging of the neck was performed using the
standard protocol following the bolus administration of intravenous
contrast.
CONTRAST:  100mL OMNIPAQUE IOHEXOL 300 MG/ML  SOLN

[Series 3: neck with st · axial · 0.37mm/px · z∈[+143,+273]mm · 4 of 109 slices shown]
[im 22/109  bone]
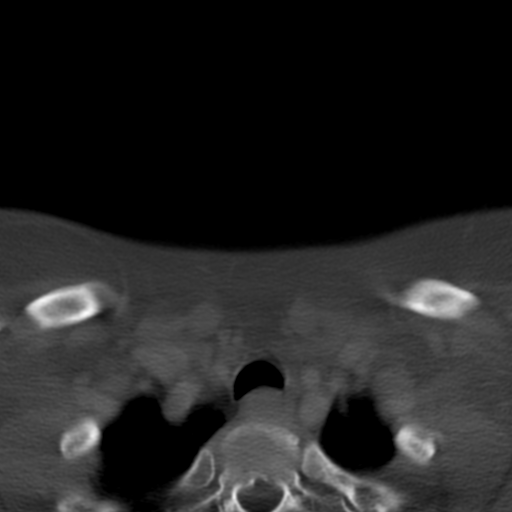
[im 44/109  bone]
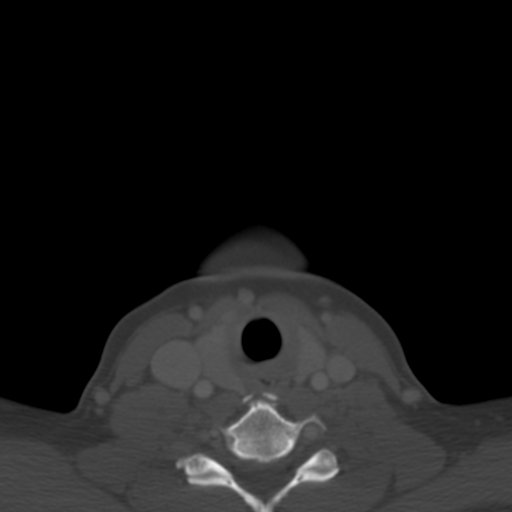
[im 65/109  bone]
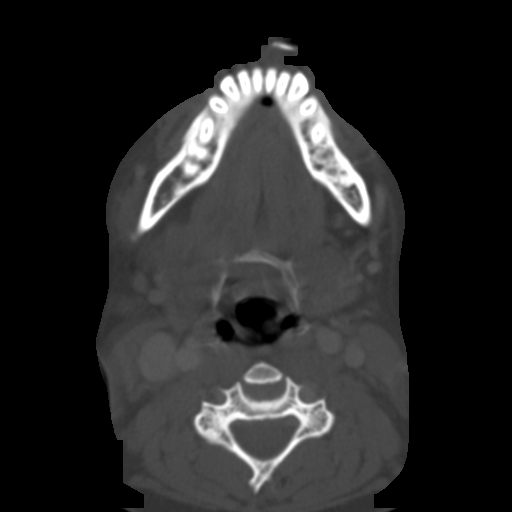
[im 87/109  bone]
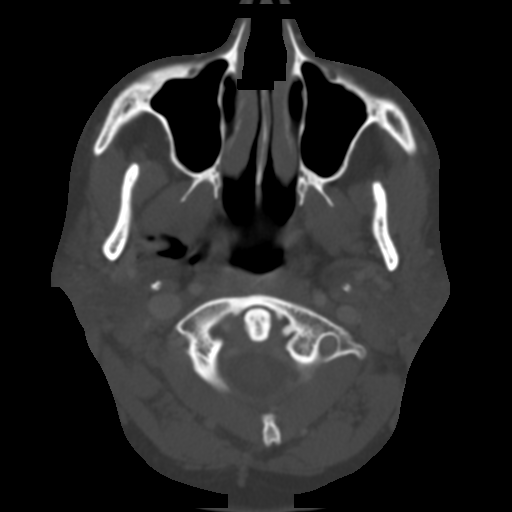

[Series 6: axial recons · axial · 0.39mm/px · z∈[+135,+262]mm · 4 of 108 slices shown, 5 images]
[im 22/108  soft-tissue]
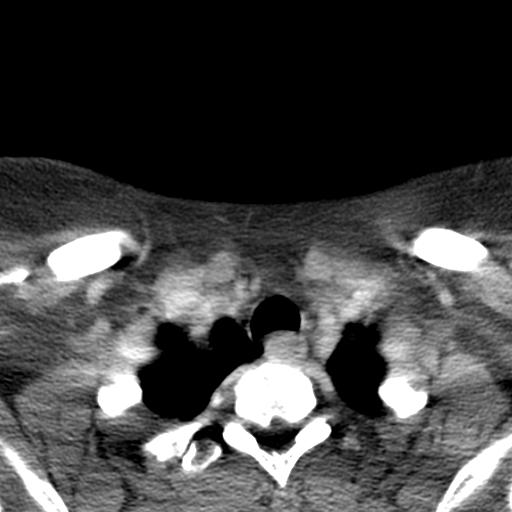
[im 22/108  bone]
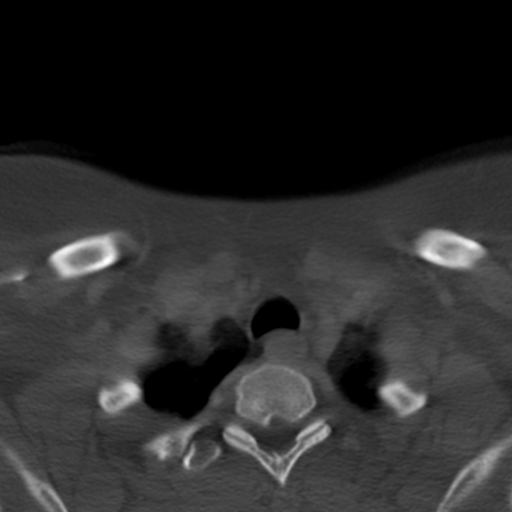
[im 43/108  bone]
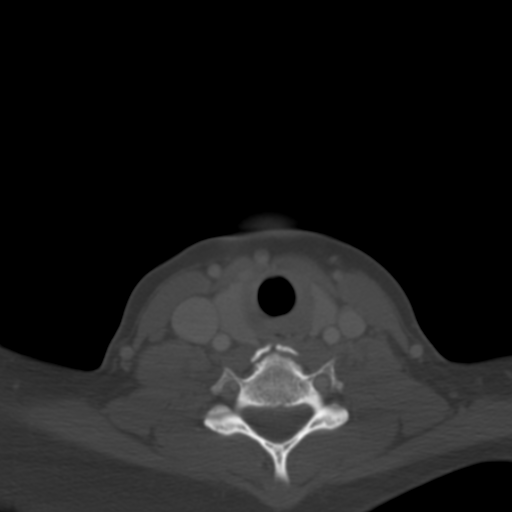
[im 65/108  bone]
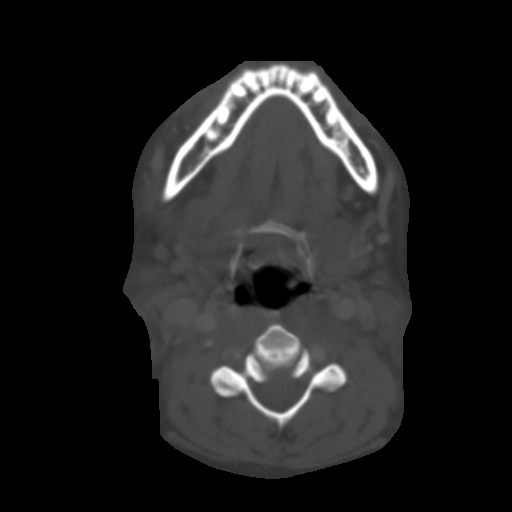
[im 86/108  bone]
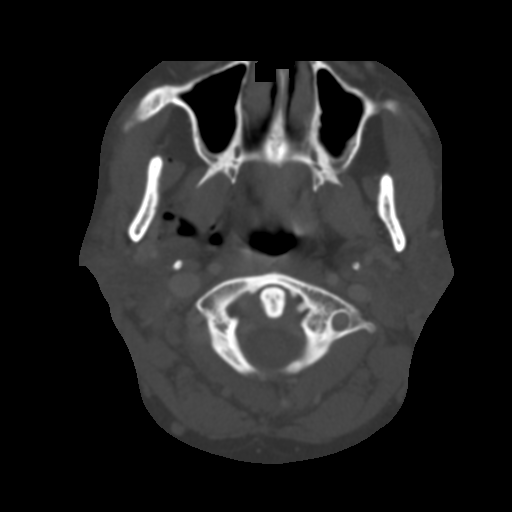

[8 of 14 positions shown; findings below may reference images not displayed]

FINDINGS: Visualized portions of upper lung fields appear normal. Globes and
orbits appear normal. Visualized portions of paranasal sinuses
appear normal. Parotid and submandibular glands appear normal.
Larynx and thyroid gland appear normal. Epiglottis appears normal.
No significant adenopathy is noted. Visualized vasculature appears
normal. Large defect is seen posteriorly in right mandible
consistent with history of wisdom tooth extraction. Chest superior
and medial to this, soft tissue gas is noted extending to the right
parapharyngeal space. No abscess or fluid collection is noted.
IMPRESSION: Findings consistent with wisdom tooth extraction involving the right
posterior mandible. Air-filled defect is noted in the extraction
site of the right posterior mandible. Air is also noted extending
medially in superior from this site into the right parapharyngeal
space consistent with postsurgical finding. No abscess or fluid
collection is noted.

## 2015-01-10 ENCOUNTER — Emergency Department (HOSPITAL_COMMUNITY): Payer: Medicare Other

## 2015-01-10 ENCOUNTER — Encounter (HOSPITAL_COMMUNITY): Payer: Self-pay | Admitting: Emergency Medicine

## 2015-01-10 ENCOUNTER — Emergency Department (HOSPITAL_COMMUNITY)
Admission: EM | Admit: 2015-01-10 | Discharge: 2015-01-10 | Disposition: A | Discharge status: Home / Self Care | Payer: Medicare Other | Attending: Emergency Medicine | Admitting: Emergency Medicine

## 2015-01-10 DIAGNOSIS — G43909 Migraine, unspecified, not intractable, without status migrainosus: Secondary | ICD-10-CM | POA: Insufficient documentation

## 2015-01-10 DIAGNOSIS — Z79899 Other long term (current) drug therapy: Secondary | ICD-10-CM | POA: Diagnosis not present

## 2015-01-10 DIAGNOSIS — Z720 Tobacco use: Secondary | ICD-10-CM | POA: Diagnosis not present

## 2015-01-10 DIAGNOSIS — I10 Essential (primary) hypertension: Secondary | ICD-10-CM | POA: Diagnosis not present

## 2015-01-10 DIAGNOSIS — Z8739 Personal history of other diseases of the musculoskeletal system and connective tissue: Secondary | ICD-10-CM | POA: Diagnosis not present

## 2015-01-10 DIAGNOSIS — R0789 Other chest pain: Secondary | ICD-10-CM

## 2015-01-10 DIAGNOSIS — G8929 Other chronic pain: Secondary | ICD-10-CM | POA: Diagnosis not present

## 2015-01-10 DIAGNOSIS — F319 Bipolar disorder, unspecified: Secondary | ICD-10-CM | POA: Diagnosis not present

## 2015-01-10 DIAGNOSIS — R079 Chest pain, unspecified: Secondary | ICD-10-CM | POA: Diagnosis present

## 2015-01-10 LAB — BASIC METABOLIC PANEL
Anion gap: 8 (ref 5–15)
BUN: 8 mg/dL (ref 6–20)
CALCIUM: 8.5 mg/dL — AB (ref 8.9–10.3)
CO2: 24 mmol/L (ref 22–32)
CREATININE: 0.54 mg/dL (ref 0.44–1.00)
Chloride: 107 mmol/L (ref 101–111)
GFR calc non Af Amer: >60 mL/min (ref >60)
Glucose, Bld: 90 mg/dL (ref 65–99)
Potassium: 3.8 mmol/L (ref 3.5–5.1)
SODIUM: 139 mmol/L (ref 135–145)

## 2015-01-10 LAB — CBC
HCT: 32.8 % — ABNORMAL LOW (ref 36.0–46.0)
Hemoglobin: 10.8 g/dL — ABNORMAL LOW (ref 12.0–15.0)
MCH: 24.3 pg — AB (ref 26.0–34.0)
MCHC: 32.9 g/dL (ref 30.0–36.0)
MCV: 73.7 fL — ABNORMAL LOW (ref 78.0–100.0)
PLATELETS: 384 10*3/uL (ref 150–400)
RBC: 4.45 MIL/uL (ref 3.87–5.11)
RDW: 18.6 % — ABNORMAL HIGH (ref 11.5–15.5)
WBC: 10.6 10*3/uL — AB (ref 4.0–10.5)

## 2015-01-10 LAB — I-STAT TROPONIN, ED: TROPONIN I, POC: 0 ng/mL (ref 0.00–0.08)

## 2015-01-10 MED ORDER — OXYCODONE-ACETAMINOPHEN 5-325 MG PO TABS
2.0000 | ORAL_TABLET | Freq: Once | ORAL | Status: AC
  Administered 2015-01-10: 2 via ORAL
  Filled 2015-01-10: qty 2

## 2015-01-10 MED ORDER — METAXALONE 800 MG PO TABS: 800.0000 mg | ORAL_TABLET | Freq: Three times a day (TID) | ORAL | Status: DC | PRN

## 2015-01-10 NOTE — ED Provider Notes (Signed)
CSN: [id number]     Arrival date & time 01/10/15  1215 History   First MD Initiated Contact with Patient 01/10/15 1248     Chief Complaint  Patient presents with  . Chest Pain      HPI Pt was seen at 1305. Per pt, c/o gradual onset and persistence of constant acute flair of her chronic left sided chest wall "pain" since yesterday. Describes the pain as "constant," and "sharp," worsens with palpation of the area, movement of her left shoulder/arm, and body position changes. Denies injury, no palpitations, no SOB/cough, no abd pain, no N/V/D, no back pain, no rash, no fevers.    Past Medical History  Diagnosis Date  . Hypertension   . Chronic back pain   . Migraine headache   . Stress incontinence   . Sciatica   . Bipolar disorder    Past Surgical History  Procedure Laterality Date  . Fracture surgery    . Back surgery    . Cesarean section    . Gastric bypass    . Ankle surgery    . Lumbar disc surgery    . Lumbar fusion    . Tubal ligation     Family History  Problem Relation Age of Onset  . Diabetes Mother   . Hypertension Mother   . Diabetes Father   . Hypertension Father    Social History  Substance Use Topics  . Smoking status: Current Every Day Smoker -- 1.00 packs/day    Types: Cigarettes  . Smokeless tobacco: Never Used  . Alcohol Use: No    Review of Systems ROS: Statement: All systems negative except as marked or noted in the HPI; Constitutional: Negative for fever and chills. ; ; Eyes: Negative for eye pain, redness and discharge. ; ; ENMT: Negative for ear pain, hoarseness, nasal congestion, sinus pressure and sore throat. ; ; Cardiovascular: Negative for chest pain, palpitations, diaphoresis, dyspnea and peripheral edema. ; ; Respiratory: Negative for cough, wheezing and stridor. ; ; Gastrointestinal: Negative for nausea, vomiting, diarrhea, abdominal pain, blood in stool, hematemesis, jaundice and rectal bleeding. . ; ; Genitourinary: Negative for  dysuria, flank pain and hematuria. ; ; Musculoskeletal: +chest wall pain. Negative for back pain and neck pain. Negative for swelling and trauma.; ; Skin: Negative for pruritus, rash, abrasions, blisters, bruising and skin lesion.; ; Neuro: Negative for headache, lightheadedness and neck stiffness. Negative for weakness, altered level of consciousness , altered mental status, extremity weakness, paresthesias, involuntary movement, seizure and syncope.      Allergies  Ketorolac tromethamine and Methocarbamol  Home Medications   Prior to Admission medications   Medication Sig Start Date End Date Taking? Authorizing Provider  acetaminophen (TYLENOL) 500 MG tablet Take 1,000 mg by mouth every 6 (six) hours as needed for moderate pain (pain).   Yes Historical Provider, MD  amitriptyline (ELAVIL) 75 MG tablet Take 1 tablet (75 mg total) by mouth at bedtime. 02/11/14  Yes Patrecia Pour, NP  amLODipine (NORVASC) 10 MG tablet Take 10 mg by mouth daily.   Yes Historical Provider, MD  DULoxetine (CYMBALTA) 60 MG capsule Take 60 mg by mouth daily. 12/23/14  Yes Historical Provider, MD  gabapentin (NEURONTIN) 300 MG capsule Take 1 capsule (300 mg total) by mouth 3 (three) times daily. 02/11/14  Yes Patrecia Pour, NP  HYDROcodone-acetaminophen (NORCO) 7.5-325 MG per tablet Take 1 tablet by mouth 3 (three) times daily as needed. 12/18/14  Yes Historical Provider, MD  amLODipine ([place])  5 MG tablet Take 1 tablet (5 mg total) by mouth daily. Patient not taking: Reported on 01/10/2015 02/11/14   Patrecia Pour, NP  calcium carbonate (TUMS EX) 750 MG chewable tablet Chew 4 tablets (3,000 mg total) by mouth daily. Patient not taking: Reported on 05/05/2014 02/11/14   Patrecia Pour, NP  HYDROcodone-acetaminophen (NORCO/VICODIN) 5-325 MG per tablet Take 2 tablets by mouth every 4 (four) hours as needed for moderate pain. Patient not taking: Reported on 07/07/2014 06/11/14   Orpah Greek, MD  hydrOXYzine  (ATARAX/VISTARIL) 25 MG tablet Take 1 tablet (25 mg total) by mouth every 6 (six) hours as needed for anxiety. Patient not taking: Reported on 01/10/2015 02/11/14   Patrecia Pour, NP  ibuprofen (ADVIL,MOTRIN) 600 MG tablet Take 1 tablet (600 mg total) by mouth every 6 (six) hours as needed. Patient not taking: Reported on 01/10/2015 04/20/14   Waynetta Pean, PA-C  ranitidine (ZANTAC) 150 MG tablet Take 1 tablet (150 mg total) by mouth 2 (two) times daily. 06/11/14   Orpah Greek, MD  sucralfate (CARAFATE) 1 GM/10ML suspension Take 10 mLs (1 g total) by mouth 4 (four) times daily -  with meals and at bedtime. Patient not taking: Reported on 07/07/2014 06/11/14   Orpah Greek, MD   BP 148/75 mmHg  Pulse 79  Temp(Src) 97.8 F (36.6 C) (Oral)  Resp 18  SpO2 100% Physical Exam  1310: Physical examination:  Nursing notes reviewed; Vital signs and O2 SAT reviewed;  Constitutional: Well developed, Well nourished, Well hydrated, Thrashing around on the stretcher, moaning loudly.; Head:  Normocephalic, atraumatic; Eyes: EOMI, PERRL, No scleral icterus; ENMT: Mouth and pharynx normal, Mucous membranes moist; Neck: Supple, Full range of motion, No lymphadenopathy; Cardiovascular: Regular rate and rhythm, No murmur, rub, or gallop; Respiratory: Breath sounds clear & equal bilaterally, No rales, rhonchi, wheezes.  Speaking full sentences with ease, Normal respiratory effort/excursion; Chest: +left upper chest wall tenderness to palp. No rash, no deformity, no soft tissue crepitus. Movement normal; Abdomen: Soft, Nontender, Nondistended, Normal bowel sounds; Genitourinary: No CVA tenderness; Extremities: Pulses normal, No tenderness, No edema, No calf edema or asymmetry.; Neuro: AA&Ox3, Major CN grossly intact.  Speech clear. No gross focal motor or sensory deficits in extremities.; Skin: Color normal, Warm, Dry.   ED Course  Procedures (including critical care time)   Labs Review   Imaging  Review  I have personally reviewed and evaluated these images and lab results as part of my medical decision-making.       .MDM  MDM Reviewed: previous chart, nursing note and vitals Reviewed previous: labs and ECG Interpretation: labs, ECG and x-ray   ED ECG REPORT   Date: 01/10/2015  Rate: 111  Rhythm: sinus tachycardia  QRS Axis: normal  Intervals: normal  ST/T Wave abnormalities: borderline repol abnl  Conduction Disutrbances:none  Narrative Interpretation:   Old EKG Reviewed: changes noted, compared to previous EKG dated 07/07/14, rate is faster   Results for orders placed or performed during the hospital encounter of 11/91/47  Basic metabolic panel  Result Value Ref Range   Sodium 139 135 - 145 mmol/L   Potassium 3.8 3.5 - 5.1 mmol/L   Chloride 107 101 - 111 mmol/L   CO2 24 22 - 32 mmol/L   Glucose, Bld 90 65 - 99 mg/dL   BUN 8 6 - 20 mg/dL   Creatinine, Ser 0.54 0.44 - 1.00 mg/dL   Calcium 8.5 (L) 8.9 - 10.3 mg/dL  GFR calc non Af Amer >60 >60 mL/min   GFR calc Af Amer >60 >60 mL/min   Anion gap 8 5 - 15  CBC  Result Value Ref Range   WBC 10.6 (H) 4.0 - 10.5 K/uL   RBC 4.45 3.87 - 5.11 MIL/uL   Hemoglobin 10.8 (L) 12.0 - 15.0 g/dL   HCT 32.8 (L) 36.0 - 46.0 %   MCV 73.7 (L) 78.0 - 100.0 fL   MCH 24.3 (L) 26.0 - 34.0 pg   MCHC 32.9 30.0 - 36.0 g/dL   RDW 18.6 (H) 11.5 - 15.5 %   Platelets 384 150 - 400 K/uL  I-stat troponin, ED  Result Value Ref Range   Troponin i, poc 0.00 0.00 - 0.08 ng/mL   Comment 3           Dg Chest 2 View 01/10/2015   CLINICAL DATA:  Left-sided chest pressure.  Pain started yesterday.  EXAM: CHEST  2 VIEW  COMPARISON:  06/01/2014  FINDINGS: Both lungs are clear. Heart and mediastinum are within normal limits. Trachea is midline. No acute bone abnormality. No pleural effusions.  IMPRESSION: No active cardiopulmonary disease.   Electronically Signed   By: Markus Daft M.D.   On: 01/10/2015 14:49    1505: Doubt PE as cause for  symptoms with low risk [place].  Doubt ACS as cause for symptoms with Heart score 0, normal troponin and unchanged EKG from previous after 2 days of constant symptoms. When ED staff is not in her exam room, pt appears comfortable, watching TV and taking PO well without N/V.  When ED staff enter her exam room, pt will then start to thrash about on the stretcher. Will tx symptomatically at this time. [place] Controlled Substance Database accessed: pt filled hydrocodone 7.5mg /APAP 325mg , #90, on 12/18/14. Will not prescribe any further narcotic medications; pt aware.  Pt wants to go home now. Dx and testing d/w pt and family.  Questions answered.  Verb understanding, agreeable to d/c home with outpt f/u.    Francine Graven, DO 01/12/15 1429

## 2015-01-10 NOTE — ED Notes (Signed)
Pt c/o left sided chest pressure esp around her breast, then sharp pains around her left shoulder area.  Pt states that pain started yesterday and got worse today.  Pt has HTN and smoker.

## 2015-01-10 NOTE — ED Notes (Signed)
Patient requesting to speak with Dr before having anything done. MD notified.

## 2015-01-10 NOTE — Discharge Instructions (Signed)
°Emergency Department Resource Guide °1) Find a Doctor and Pay Out of Pocket °Although you won't have to find out who is covered by your insurance plan, it is a good idea to ask around and get recommendations. You will then need to call the office and see if the doctor you have chosen will accept you as a new patient and what types of options they offer for patients who are self-pay. Some doctors offer discounts or will set up payment plans for their patients who do not have insurance, but you will need to ask so you aren't surprised when you get to your appointment. ° °2) Contact Your Local Health Department °Not all health departments have doctors that can see patients for sick visits, but many do, so it is worth a call to see if yours does. If you don't know where your local health department is, you can check in your phone book. The CDC also has a tool to help you locate your state's health department, and many state websites also have listings of all of their local health departments. ° °3) Find a Walk-in Clinic °If your illness is not likely to be very severe or complicated, you may want to try a walk in clinic. These are popping up all over the country in pharmacies, drugstores, and shopping centers. They're usually staffed by nurse practitioners or physician assistants that have been trained to treat common illnesses and complaints. They're usually fairly quick and inexpensive. However, if you have serious medical issues or chronic medical problems, these are probably not your best option. ° °No Primary Care Doctor: °- Call [facility] at  [phone] - they can help you locate a primary care doctor that  accepts your insurance, provides certain services, etc. °- Physician Referral Service- 1-800-533-3463 ° °Chronic Pain Problems: °Organization         Address  Phone   Notes  °[facility]  ([phone]) [phone] Patients need to be referred by their primary care doctor.  ° °Medication  Assistance: °Organization         Address  Phone   Notes  °[place] [facility] [address] °[place], [place] [address] (336) 641-8030 --Must be a resident of [place] °-- Must have NO insurance coverage whatsoever (no Medicaid/ Medicare, etc.) °-- The pt. MUST have a primary care doctor that directs their care regularly and follows them in the community °  °[facility]  (866) 331-1348   °[employer]  (888) 892-1162   ° °Agencies that provide inexpensive medical care: °Organization         Address  Phone   Notes  °[facility]  (336) 832-8035   °[facility]    (336) 832-7272   °[facility] [address] °[place], [place] [address] (336) 832-4777   °[facility] [address], °[place] (336) 271-4999   °[facility]    (336) 373-0678   °[facility]    (336) 272-1050   °[facility] ° [address], [place] Phone:  (336) 832-4444, Fax:  (336) 832-4440 Hours of Operation:  9 am - 6 pm, M-F.  Also accepts Medicaid/Medicare and self-pay.  °[facility] ° [address], [place] Phone: (336) [phone], Fax: (336) [phone]. Hours of Operation:  8:30 am - 5:30 pm, M-F.  Also accepts Medicaid and self-pay.  °[facility] [place] [address], [place] Phone: (336) 878-6027   °[facility] [address], [place], [place] (336)723-1848, Ext. 123 Mondays & Thursdays: 7-9 AM.  First 15 patients are seen on a first come, first serve basis. °  ° °Medicaid-accepting [place] Providers: ° °Organization         Address  Phone   Notes  °[facility] [address], [place] (336) 641-2100 Also accepts self-pay patients.  °[facility] [address], [place] ° (336) [phone]   °[facility] [address], [place]  (336) 288-8857   °[facility] [address] [place] [address], [place] (336) 299-7000   °Veita Bland [address], [place]  ° (336) 373-1557 Only accepts [place] Access Medicaid patients after they have their name applied to their card.  ° °Self-Pay (no insurance) in [place]: ° °Organization         Address  Phone   Notes  °Sickle Cell Patients, [facility] [address], [place] (336) 832-1970   °[facility] [address], [place] (336) 832-4400   °[facility] ° [address], [place] (336) 992-4800   °[facility]/Dr. Osei-Bonsu ° [address] [place] [address], [place] or [address], [place] (336) [phone] Phone number for both [place] and [place] locations is the same.  °[facility] [address], [place] (336) 299-0000   °[facility] [address] [place] [address], [place] or [address] (336) 852-7530 °(336) 878-2260   °[facility] [address], [place] (336) 350-1642, phone; (336) 294-5005, fax Sees patients 1st and 3rd Saturday of every month.  Must not qualify for public or private insurance (i.e. Medicaid, Medicare, [place] Health Choice, Veterans' Benefits) • Household income should be no more than 200% of the poverty level •The clinic cannot treat you if you are pregnant or think you are pregnant • Sexually transmitted diseases are not treated at the clinic.  ° ° °Dental Care: °Organization         Address  Phone  Notes  °[place] [facility] [address], [place] (336) 641-6152 Accepts children up to age 21 who are enrolled in Medicaid or [place] Health Choice; pregnant women with a Medicaid card; and children who have applied for Medicaid or [place] Health Choice, but were declined, whose parents can pay a reduced fee at time of service.  °[place]  [facility] [place]  [address], [place] (336) 641-7733 Accepts children up to age 21 who are enrolled in Medicaid or [place] Health Choice; pregnant women with a Medicaid card; and children who have applied for Medicaid or [place] Health Choice, but were declined, whose parents can pay a reduced fee at time of service.  °[facility] ° [address],  (336) 641-4533 Patients are seen by appointment only. Walk-ins are not accepted. [facility] will see patients 18 years of age and older. °Monday - Tuesday (8am-5pm) °Most Wednesdays (8:30-5pm) °$30 per visit, cash only  °[facility] ° [address], [place] (336) [phone] Patients are seen by appointment only. Walk-ins are not accepted. [facility] will see patients 18 years of age and older. °One   Wednesday Evening (Monthly: Volunteer Based).  $30 per visit, cash only  °[facility]  (919) [phone] for adults; Children under age 4, call Graduate Pediatric Dentistry at ([phone]) [phone]. Children aged 4-14, please call ([phone]) [phone] to request a pediatric application. ° Dental services are provided in all areas of dental care including fillings, crowns and bridges, complete and partial dentures, implants, gum treatment, root canals, and extractions. Preventive care is also provided. Treatment is provided to both adults and children. °Patients are selected via a lottery and there is often a waiting list. °  °[facility] [address], °[place] ° (336) 763-8833 [email or web address] °  °[facility] [address], [place], [place] (336)723-1848, Ext. 123 Second and Fourth Thursday of each month, opens at 6:30 AM; Clinic ends at 9 AM.  Patients are seen on a first-come first-served basis, and a limited number are seen during each clinic.  ° °[facility] ° [address], [place], [place] (336) [phone]    Eligibility Requirements °You must have lived in [place], [place], or [place] counties for at least the last three months. °  You cannot be eligible for state or federal sponsored healthcare insurance, including Veterans Administration, Medicaid, or Medicare. °  You generally cannot be eligible for healthcare insurance through your employer.  °  How to apply: °Eligibility screenings are held every Tuesday and Wednesday afternoon from 1:00 pm until 4:00 pm. You do not need an appointment for the interview!  °[facility] [address], [place], [place] 336-631-2330   °[facility]  336-342-8273   °[facility]  336-703-3100   °[facility]  336-570-6415   ° °Behavioral Health Resources in the Community: °Intensive Outpatient Programs °Organization         Address  Phone  Notes  °[facility] [address], [place], [place] 336-878-6098   °[facility] [address], [place], [place] 336-832-9800   °[facility] [address], [place], [place] ° 336-882-2125   °[facility] [address],  °[place], [place] 1-800-853-5163 or 336-641-4981   °Substance Abuse Resources °Organization         Address  Phone  Notes  °[facility]  336-882-2125   °[facility]  336-784-9470   °[facility]  336-285-9073   °[facility]  336-845-3988   °Residential & Outpatient Substance Abuse Program  1-800-659-3381   °Psychological Services °Organization         Address  Phone  Notes  °[facility]  336- 832-9600   °[facility]  336- 378-7881   °[facility] [address], [place] 1-800-853-5163 or 336-641-4981   ° °Mobile Crisis Teams °Organization         Address  Phone  Notes  °[facility], Mobile Crisis Care Unit  1-877-626-1772   °Assertive °[facility] ° [address]  [place], [place] 336-834-9664   °Sharon DeEsch [address] °[place] 336-554-5454   ° °Self-Help/Support Groups °Organization         Address  Phone             Notes  °[facility] [place] - variety of support groups  336- 373-1402 Call for more information  °Narcotics Anonymous (NA), [facility] [address], °[place]  2 meetings at this location  ° °  Residential Treatment Programs Organization         Address  Phone  Notes  [facility] [address]    [place]  [phone]   [facility]  [address] [place] [address], [place], [place]   [place], [place] 272-854-0959 Admissions: 8am-3pm M-F  [facility] [place] [address],    [place], [place] 188-416-6063   [facility] [address] [place], [place], [place]   [facility] [address]  [place], [place]   [facility] Intensive Outpatient [address], [place], [place]   [facility] [address]  [place], [place] 1-646-804-1653 or 614-278-3893   [facility] [address], [place], [place] Accepts Medicaid  [facility] [address],  [place] 1-403-817-8097 Substance Abuse/Addiction Treatment   [facility] Organization         Address  Phone  Notes  [facility]  (737)228-4610   Domenic Schwab, PhD [address] [place], [place]   8014611050 or 639-733-4369   [place], [place] [phone]   [facility] [address] [place], [place] 484-845-4110 Insurance/Medicaid/sponsorship through [facility] [address]                                    [place], [place] [phone] [place] [address], [place] [phone]    Dr. Adele Schilder  [phone]   [facility] Dept. 1) [address] [place] 2) [place] 3)  [address], [place] ([phone]  [phone]   [place] 302-498-2776 or ([phone] (After Hours)      Take the prescription as directed.  Apply moist heat or ice to the area(s) of discomfort, for 15 minutes at a time, several times per day for the next few days.  Do not fall asleep on a heating or ice pack.  Call your regular medical doctor today to schedule a follow up appointment in the next 3 days.  Return to the Emergency Department immediately if worsening.

## 2015-01-22 IMAGING — CR DG CHEST 2V
2 series · 2 of 2 positions shown · non-contrast
Comparison: March 18, 2013

CLINICAL DATA: Mid sided left chest pain radiating toward back.
Nausea

EXAM:
CHEST  2 VIEW

[w chest pa]
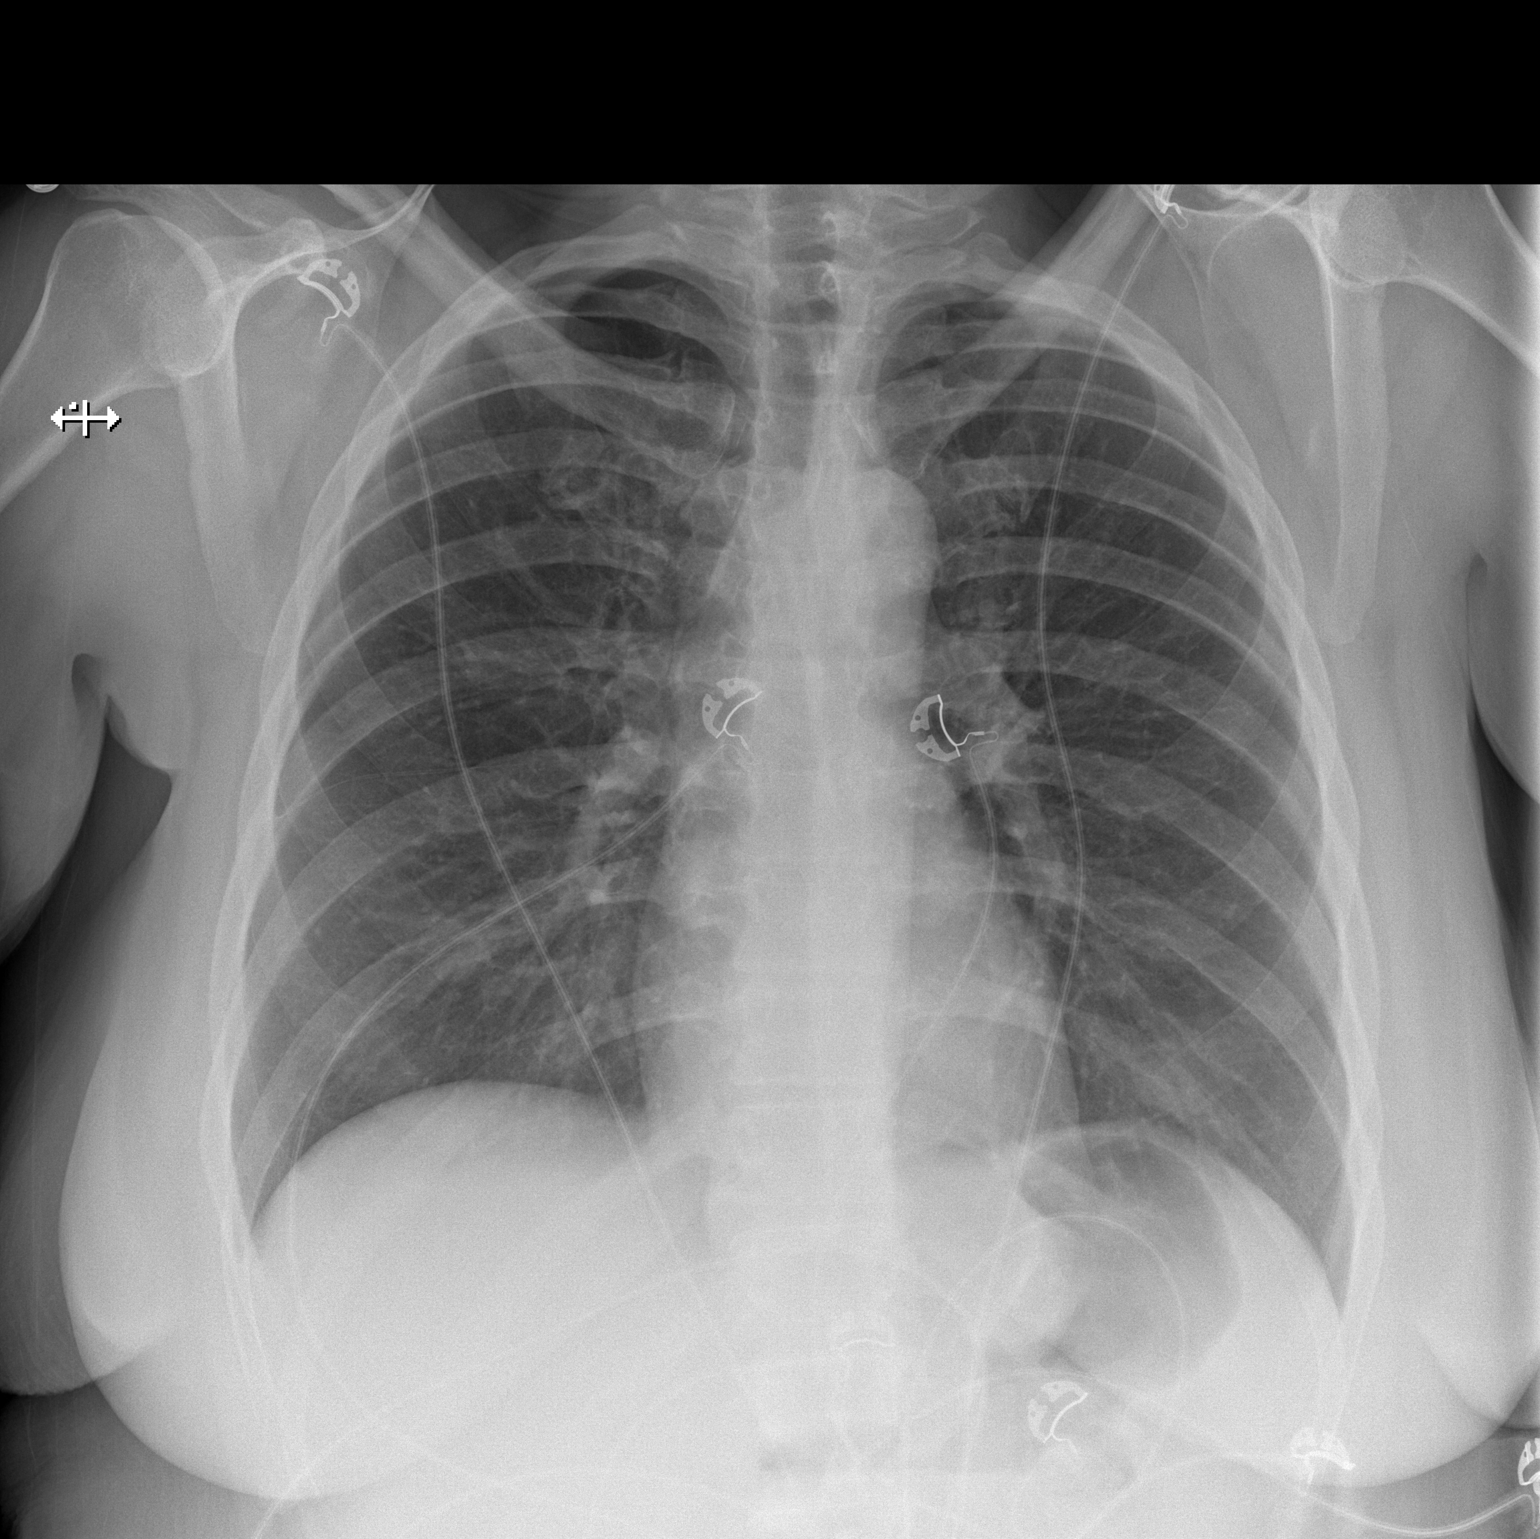

[w chest lat]
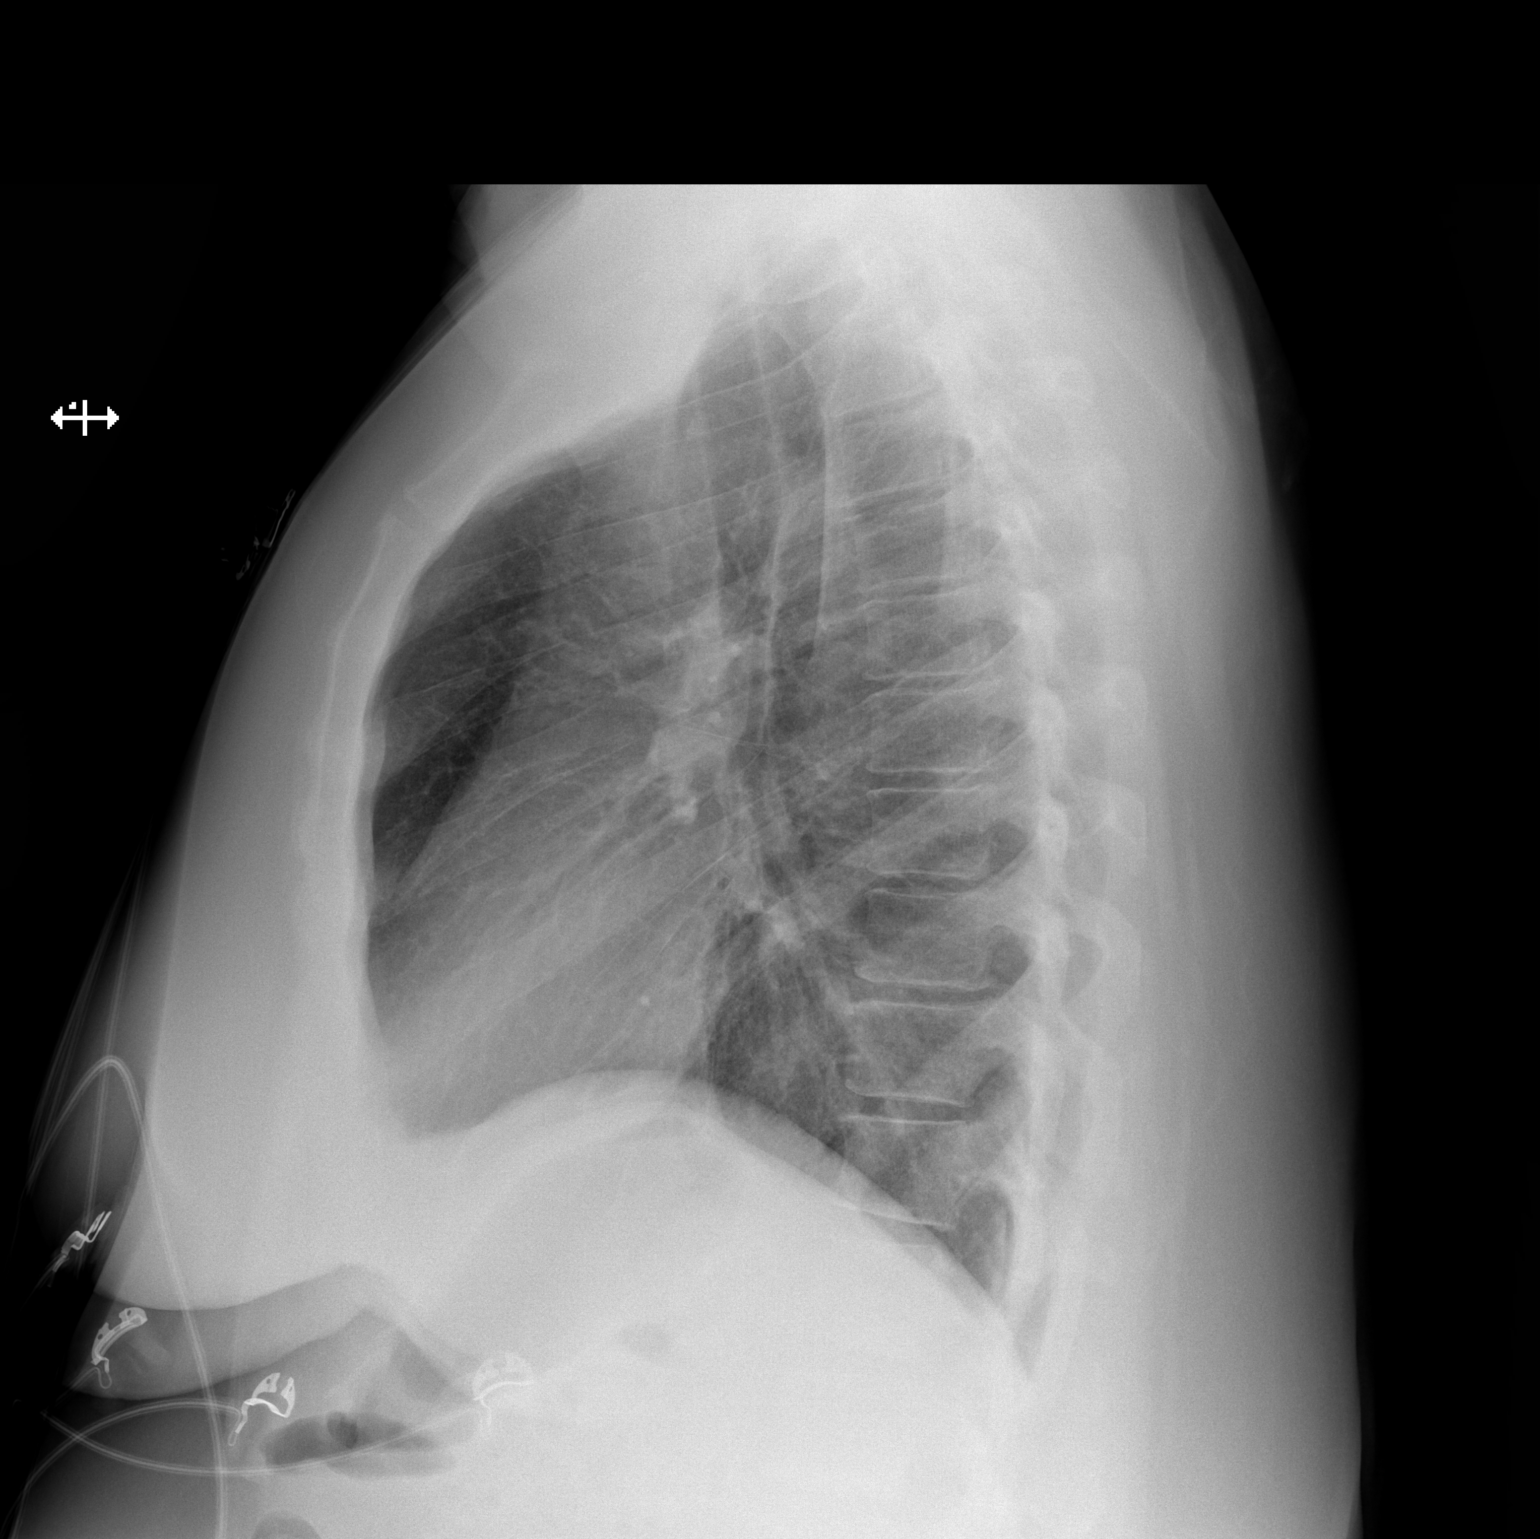

[2 of 2 positions shown; findings below may reference images not displayed]

FINDINGS: Lungs are clear. Heart size and pulmonary vascularity are normal. No
adenopathy. No pneumothorax. No bone lesions.
IMPRESSION: No edema or consolidation.

## 2015-02-09 ENCOUNTER — Encounter (HOSPITAL_COMMUNITY): Payer: Self-pay | Admitting: Nurse Practitioner

## 2015-02-09 ENCOUNTER — Emergency Department (HOSPITAL_COMMUNITY): Payer: Medicare Other

## 2015-02-09 ENCOUNTER — Emergency Department (HOSPITAL_COMMUNITY)
Admission: EM | Admit: 2015-02-09 | Discharge: 2015-02-09 | Disposition: A | Discharge status: Home / Self Care | Payer: Medicare Other | Attending: Emergency Medicine | Admitting: Emergency Medicine

## 2015-02-09 DIAGNOSIS — J069 Acute upper respiratory infection, unspecified: Secondary | ICD-10-CM

## 2015-02-09 DIAGNOSIS — M545 Low back pain: Secondary | ICD-10-CM | POA: Diagnosis not present

## 2015-02-09 DIAGNOSIS — R079 Chest pain, unspecified: Secondary | ICD-10-CM | POA: Diagnosis not present

## 2015-02-09 DIAGNOSIS — F419 Anxiety disorder, unspecified: Secondary | ICD-10-CM | POA: Insufficient documentation

## 2015-02-09 DIAGNOSIS — Z3202 Encounter for pregnancy test, result negative: Secondary | ICD-10-CM | POA: Insufficient documentation

## 2015-02-09 DIAGNOSIS — I10 Essential (primary) hypertension: Secondary | ICD-10-CM | POA: Diagnosis not present

## 2015-02-09 DIAGNOSIS — Z79899 Other long term (current) drug therapy: Secondary | ICD-10-CM | POA: Insufficient documentation

## 2015-02-09 DIAGNOSIS — R05 Cough: Secondary | ICD-10-CM | POA: Diagnosis present

## 2015-02-09 DIAGNOSIS — Z720 Tobacco use: Secondary | ICD-10-CM | POA: Insufficient documentation

## 2015-02-09 DIAGNOSIS — G8929 Other chronic pain: Secondary | ICD-10-CM | POA: Diagnosis not present

## 2015-02-09 DIAGNOSIS — F319 Bipolar disorder, unspecified: Secondary | ICD-10-CM | POA: Diagnosis not present

## 2015-02-09 LAB — BASIC METABOLIC PANEL
Anion gap: 8 (ref 5–15)
BUN: 7 mg/dL (ref 6–20)
CALCIUM: 9.1 mg/dL (ref 8.9–10.3)
CHLORIDE: 107 mmol/L (ref 101–111)
CO2: 24 mmol/L (ref 22–32)
CREATININE: 0.51 mg/dL (ref 0.44–1.00)
Glucose, Bld: 88 mg/dL (ref 65–99)
Potassium: 3.5 mmol/L (ref 3.5–5.1)
SODIUM: 139 mmol/L (ref 135–145)

## 2015-02-09 LAB — URINALYSIS, ROUTINE W REFLEX MICROSCOPIC
BILIRUBIN URINE: NEGATIVE
GLUCOSE, UA: NEGATIVE mg/dL
HGB URINE DIPSTICK: NEGATIVE
KETONES UR: 15 mg/dL — AB
Nitrite: NEGATIVE
PH: 7 (ref 5.0–8.0)
PROTEIN: NEGATIVE mg/dL
Specific Gravity, Urine: 1.016 (ref 1.005–1.030)
Urobilinogen, UA: 1 mg/dL (ref 0.0–1.0)

## 2015-02-09 LAB — CBC WITH DIFFERENTIAL/PLATELET
BASOS PCT: 0 %
Basophils Absolute: 0 10*3/uL (ref 0.0–0.1)
EOS ABS: 0.1 10*3/uL (ref 0.0–0.7)
EOS PCT: 1 %
HCT: 33.9 % — ABNORMAL LOW (ref 36.0–46.0)
HEMOGLOBIN: 11.4 g/dL — AB (ref 12.0–15.0)
Lymphocytes Relative: 21 %
Lymphs Abs: 2.3 10*3/uL (ref 0.7–4.0)
MCH: 24.1 pg — ABNORMAL LOW (ref 26.0–34.0)
MCHC: 33.6 g/dL (ref 30.0–36.0)
MCV: 71.7 fL — ABNORMAL LOW (ref 78.0–100.0)
MONOS PCT: 7 %
Monocytes Absolute: 0.7 10*3/uL (ref 0.1–1.0)
NEUTROS PCT: 71 %
Neutro Abs: 7.8 10*3/uL — ABNORMAL HIGH (ref 1.7–7.7)
PLATELETS: 389 10*3/uL (ref 150–400)
RBC: 4.73 MIL/uL (ref 3.87–5.11)
RDW: 18.2 % — ABNORMAL HIGH (ref 11.5–15.5)
WBC: 10.9 10*3/uL — AB (ref 4.0–10.5)

## 2015-02-09 LAB — POC URINE PREG, ED: PREG TEST UR: NEGATIVE

## 2015-02-09 LAB — URINE MICROSCOPIC-ADD ON

## 2015-02-09 MED ORDER — ONDANSETRON HCL 4 MG/2ML IJ SOLN
4.0000 mg | Freq: Once | INTRAMUSCULAR | Status: AC
  Administered 2015-02-09: 4 mg via INTRAVENOUS
  Filled 2015-02-09: qty 2

## 2015-02-09 MED ORDER — LORAZEPAM 1 MG PO TABS
1.0000 mg | ORAL_TABLET | Freq: Once | ORAL | Status: AC
  Administered 2015-02-09: 1 mg via ORAL
  Filled 2015-02-09: qty 1

## 2015-02-09 MED ORDER — IBUPROFEN 800 MG PO TABS
800.0000 mg | ORAL_TABLET | Freq: Once | ORAL | Status: AC
Start: 2015-02-09 — End: 2015-02-09
  Administered 2015-02-09: 800 mg via ORAL
  Filled 2015-02-09: qty 1

## 2015-02-09 MED ORDER — ACETAMINOPHEN 500 MG PO TABS
1000.0000 mg | ORAL_TABLET | Freq: Once | ORAL | Status: AC
  Administered 2015-02-09: 1000 mg via ORAL
  Filled 2015-02-09: qty 2

## 2015-02-09 NOTE — ED Notes (Signed)
Pt c/o sweats, chills, body aches, dry cough, fatigue and diarrhea x 1 week. She is crying and states she just feels terrible

## 2015-02-09 NOTE — ED Notes (Signed)
First contact with pt. Provider at bedside. Dcd with instructions verb understand

## 2015-02-09 NOTE — Discharge Instructions (Signed)
Take 4 over the counter ibuprofen tablets 3 times a day or 2 over-the-counter naproxen tablets twice a day for pain.  Upper Respiratory Infection, Adult An upper respiratory infection (URI) is also sometimes known as the common cold. The upper respiratory tract includes the nose, sinuses, throat, trachea, and bronchi. Bronchi are the airways leading to the lungs. Most people improve within 1 week, but symptoms can last up to 2 weeks. A residual cough may last even longer.  CAUSES Many different viruses can infect the tissues lining the upper respiratory tract. The tissues become irritated and inflamed and often become very moist. Mucus production is also common. A cold is contagious. You can easily spread the virus to others by oral contact. This includes kissing, sharing a glass, coughing, or sneezing. Touching your mouth or nose and then touching a surface, which is then touched by another person, can also spread the virus. SYMPTOMS  Symptoms typically develop 1 to 3 days after you come in contact with a cold virus. Symptoms vary from person to person. They may include:  Runny nose.  Sneezing.  Nasal congestion.  Sinus irritation.  Sore throat.  Loss of voice (laryngitis).  Cough.  Fatigue.  Muscle aches.  Loss of appetite.  Headache.  Low-grade fever. DIAGNOSIS  You might diagnose your own cold based on familiar symptoms, since most people get a cold 2 to 3 times a year. Your caregiver can confirm this based on your exam. Most importantly, your caregiver can check that your symptoms are not due to another disease such as strep throat, sinusitis, pneumonia, asthma, or epiglottitis. Blood tests, throat tests, and X-rays are not necessary to diagnose a common cold, but they may sometimes be helpful in excluding other more serious diseases. Your caregiver will decide if any further tests are required. RISKS AND COMPLICATIONS  You may be at risk for a more severe case of the common  cold if you smoke cigarettes, have chronic heart disease (such as heart failure) or lung disease (such as asthma), or if you have a weakened immune system. The very young and very old are also at risk for more serious infections. Bacterial sinusitis, middle ear infections, and bacterial pneumonia can complicate the common cold. The common cold can worsen asthma and chronic obstructive pulmonary disease (COPD). Sometimes, these complications can require emergency medical care and may be life-threatening. PREVENTION  The best way to protect against getting a cold is to practice good hygiene. Avoid oral or hand contact with people with cold symptoms. Wash your hands often if contact occurs. There is no clear evidence that vitamin C, vitamin E, echinacea, or exercise reduces the chance of developing a cold. However, it is always recommended to get plenty of rest and practice good nutrition. TREATMENT  Treatment is directed at relieving symptoms. There is no cure. Antibiotics are not effective, because the infection is caused by a virus, not by bacteria. Treatment may include:  Increased fluid intake. Sports drinks offer valuable electrolytes, sugars, and fluids.  Breathing heated mist or steam (vaporizer or shower).  Eating chicken soup or other clear broths, and maintaining good nutrition.  Getting plenty of rest.  Using gargles or lozenges for comfort.  Controlling fevers with ibuprofen or acetaminophen as directed by your caregiver.  Increasing usage of your inhaler if you have asthma. Zinc gel and zinc lozenges, taken in the first 24 hours of the common cold, can shorten the duration and lessen the severity of symptoms. Pain medicines may help  with fever, muscle aches, and throat pain. A variety of non-prescription medicines are available to treat congestion and runny nose. Your caregiver can make recommendations and may suggest nasal or lung inhalers for other symptoms.  HOME CARE INSTRUCTIONS     Only take over-the-counter or prescription medicines for pain, discomfort, or fever as directed by your caregiver.  Use a warm mist humidifier or inhale steam from a shower to increase air moisture. This may keep secretions moist and make it easier to breathe.  Drink enough water and fluids to keep your urine clear or pale yellow.  Rest as needed.  Return to work when your temperature has returned to normal or as your caregiver advises. You may need to stay home longer to avoid infecting others. You can also use a face mask and careful hand washing to prevent spread of the virus. SEEK MEDICAL CARE IF:   After the first few days, you feel you are getting worse rather than better.  You need your caregiver's advice about medicines to control symptoms.  You develop chills, worsening shortness of breath, or brown or red sputum. These may be signs of pneumonia.  You develop yellow or brown nasal discharge or pain in the face, especially when you bend forward. These may be signs of sinusitis.  You develop a fever, swollen neck glands, pain with swallowing, or white areas in the back of your throat. These may be signs of strep throat. SEEK IMMEDIATE MEDICAL CARE IF:   You have a fever.  You develop severe or persistent headache, ear pain, sinus pain, or chest pain.  You develop wheezing, a prolonged cough, cough up blood, or have a change in your usual mucus (if you have chronic lung disease).  You develop sore muscles or a stiff neck. Document Released: 11/03/2000 Document Revised: 08/02/2011 Document Reviewed: 08/15/2013 [facility] Patient Information 2015 [place], [place]. This information is not intended to replace advice given to you by your health care provider. Make sure you discuss any questions you have with your health care provider.

## 2015-02-09 NOTE — ED Provider Notes (Signed)
CSN: [id number]     Arrival date & time 02/09/15  1152 History   First MD Initiated Contact with Patient 02/09/15 1203     Chief Complaint  Patient presents with  . Cough     (Consider location/radiation/quality/duration/timing/severity/associated sxs/prior Treatment) Patient is a 39 y.o. female presenting with cough. The history is provided by the patient.  Cough Cough characteristics:  Non-productive Severity:  Moderate Onset quality:  Gradual Duration:  1 week Timing:  Constant Progression:  Worsening Chronicity:  New Smoker: no   Relieved by:  Nothing Worsened by:  Nothing tried Ineffective treatments:  None tried Associated symptoms: chest pain   Associated symptoms: no chills, no fever, no headaches, no myalgias, no rhinorrhea, no shortness of breath and no wheezing   Associated symptoms comment:  Back pain  39 yo F with a chief complaint of a cough. This been going on for about a week. Associated with some myalgias but mostly these are in the chest. Patient states that's also in her back. Worse when she coughs or moves around. Is also had some loose stools associated with this. Having some subjective fevers and chills at home. Denies any sick contacts.   Past Medical History  Diagnosis Date  . Hypertension   . Chronic back pain   . Migraine headache   . Stress incontinence   . Sciatica   . Bipolar disorder    Past Surgical History  Procedure Laterality Date  . Fracture surgery    . Back surgery    . Cesarean section    . Gastric bypass    . Ankle surgery    . Lumbar disc surgery    . Lumbar fusion    . Tubal ligation     Family History  Problem Relation Age of Onset  . Diabetes Mother   . Hypertension Mother   . Diabetes Father   . Hypertension Father    Social History  Substance Use Topics  . Smoking status: Current Every Day Smoker -- 1.00 packs/day    Types: Cigarettes  . Smokeless tobacco: Never Used  . Alcohol Use: No   OB History    No  data available     Review of Systems  Constitutional: Negative for fever and chills.  HENT: Negative for congestion and rhinorrhea.   Eyes: Negative for redness and visual disturbance.  Respiratory: Positive for cough. Negative for shortness of breath and wheezing.   Cardiovascular: Positive for chest pain. Negative for palpitations.  Gastrointestinal: Negative for nausea and vomiting.  Genitourinary: Negative for dysuria and urgency.  Musculoskeletal: Positive for back pain. Negative for myalgias and arthralgias.  Skin: Negative for pallor and wound.  Neurological: Negative for dizziness and headaches.      Allergies  Ketorolac tromethamine and Methocarbamol  Home Medications   Prior to Admission medications   Medication Sig Start Date End Date Taking? Authorizing Provider  acetaminophen (TYLENOL) 500 MG tablet Take 1,000 mg by mouth every 6 (six) hours as needed for moderate pain (pain).   Yes Historical Provider, MD  amitriptyline (ELAVIL) 75 MG tablet Take 1 tablet (75 mg total) by mouth at bedtime. 02/11/14  Yes Patrecia Pour, NP  amLODipine (NORVASC) 10 MG tablet Take 10 mg by mouth daily.   Yes Historical Provider, MD  DULoxetine (CYMBALTA) 60 MG capsule Take 60 mg by mouth daily. 12/23/14  Yes Historical Provider, MD  gabapentin (NEURONTIN) 300 MG capsule Take 1 capsule (300 mg total) by mouth 3 (three) times daily.  02/11/14  Yes Patrecia Pour, NP  HYDROcodone-acetaminophen (NORCO) 7.5-325 MG per tablet Take 1 tablet by mouth 3 (three) times daily as needed (pain).  12/18/14  Yes Historical Provider, MD   BP 134/82 mmHg  Pulse 90  Temp(Src) 98.5 F (36.9 C) (Oral)  Resp 16  Ht 5\' 9"  (1.753 m)  Wt 235 lb (106.595 kg)  BMI 34.69 kg/m2  SpO2 100%  LMP 01/11/2015 Physical Exam  Constitutional: She is oriented to person, place, and time. She appears well-developed and well-nourished. No distress.  HENT:  Head: Normocephalic and atraumatic.  Eyes: EOM are normal. Pupils  are equal, round, and reactive to light.  Neck: Normal range of motion. Neck supple.  Cardiovascular: Normal rate and regular rhythm.  Exam reveals no gallop and no friction rub.   No murmur heard. Pulmonary/Chest: Effort normal. She has no wheezes. She has no rales.  Abdominal: Soft. She exhibits no distension. There is no tenderness. There is no rebound.  Musculoskeletal: She exhibits tenderness (Mild trapezius tenderness bilaterally). She exhibits no edema.  Neurological: She is alert and oriented to person, place, and time.  Skin: Skin is warm and dry. She is not diaphoretic.  Psychiatric: Her behavior is normal. Her mood appears anxious.    ED Course  Procedures (including critical care time) Labs Review Labs Reviewed  CBC WITH DIFFERENTIAL/PLATELET - Abnormal; Notable for the following:    WBC 10.9 (*)    Hemoglobin 11.4 (*)    HCT 33.9 (*)    MCV 71.7 (*)    MCH 24.1 (*)    RDW 18.2 (*)    Neutro Abs 7.8 (*)    All other components within normal limits  URINALYSIS, ROUTINE W REFLEX MICROSCOPIC (NOT AT [facility]) - Abnormal; Notable for the following:    APPearance TURBID (*)    Ketones, ur 15 (*)    Leukocytes, UA TRACE (*)    All other components within normal limits  URINE MICROSCOPIC-ADD ON - Abnormal; Notable for the following:    Squamous Epithelial / LPF MANY (*)    All other components within normal limits  BASIC METABOLIC PANEL  POC URINE PREG, ED    Imaging Review Dg Chest 2 View  02/09/2015   CLINICAL DATA:  Sweats chills body aches nonproductive cough fatigue for 1 week  EXAM: CHEST  2 VIEW  COMPARISON:  01/10/2015  FINDINGS: The heart size and mediastinal contours are within normal limits. Both lungs are clear. The visualized skeletal structures are unremarkable.  IMPRESSION: No active cardiopulmonary disease.   Electronically Signed   By: Skipper Cliche M.D.   On: 02/09/2015 13:27   I have personally reviewed and evaluated these images and lab results as part  of my medical decision-making.   EKG Interpretation None      MDM   Final diagnoses:  Viral URI    39 yo F with likely a viral syndrome. Comes in with a chief complaint of myalgias cough. Will obtain a chest x-ray urine basic lab evaluation. poc preg.   Mild leukocytosis of 10.9, chronically elevated.  No other significant lab findings.   CXR negative for infection.  UA contaminated. Will have follow up with their PCP.     I have discussed the diagnosis/risks/treatment options with the patient and family and believe the pt to be eligible for discharge home to follow-up with PCP. We also discussed returning to the ED immediately if new or worsening sx occur. We discussed the sx which are  most concerning (e.g., sudden worsening symptoms) that necessitate immediate return. Medications administered to the patient during their visit and any new prescriptions provided to the patient are listed below.  Medications given during this visit Medications  ibuprofen (ADVIL,MOTRIN) tablet 800 mg (800 mg Oral Given 02/09/15 1227)  acetaminophen (TYLENOL) tablet 1,000 mg (1,000 mg Oral Given 02/09/15 1227)  ondansetron (ZOFRAN) injection 4 mg (4 mg Intravenous Given 02/09/15 1227)  LORazepam (ATIVAN) tablet 1 mg (1 mg Oral Given 02/09/15 1227)    Discharge Medication List as of 02/09/2015  1:54 PM       The patient appears reasonably screen and/or stabilized for discharge and I doubt any other medical condition or other [facility] requiring further screening, evaluation, or treatment in the ED at this time prior to discharge.      Deno Etienne, DO 02/09/15 [id number]

## 2015-02-11 ENCOUNTER — Emergency Department (HOSPITAL_COMMUNITY)
Admission: EM | Admit: 2015-02-11 | Discharge: 2015-02-11 | Discharge status: Against Medical Advice | Payer: Medicare Other | Attending: Emergency Medicine | Admitting: Emergency Medicine

## 2015-02-11 ENCOUNTER — Encounter (HOSPITAL_COMMUNITY): Payer: Self-pay | Admitting: *Deleted

## 2015-02-11 ENCOUNTER — Emergency Department (HOSPITAL_COMMUNITY): Payer: Medicare Other

## 2015-02-11 DIAGNOSIS — Z720 Tobacco use: Secondary | ICD-10-CM | POA: Insufficient documentation

## 2015-02-11 DIAGNOSIS — R079 Chest pain, unspecified: Secondary | ICD-10-CM | POA: Diagnosis present

## 2015-02-11 DIAGNOSIS — G8929 Other chronic pain: Secondary | ICD-10-CM | POA: Diagnosis not present

## 2015-02-11 DIAGNOSIS — I10 Essential (primary) hypertension: Secondary | ICD-10-CM | POA: Insufficient documentation

## 2015-02-11 NOTE — ED Notes (Signed)
Unable to collect labs patient was moving around in the chair cursing and I only had the tip of the needle in so I removed the needle.  I put her back in the waiting room.  I made nurse aware

## 2015-02-11 NOTE — ED Notes (Signed)
Pt seen in ED on 9/18, today reports same complaints of chest pain, sweats, chills, body aches, dry cough, fatigue and diarrhea x 1 week. Pain 10/10.

## 2015-03-07 ENCOUNTER — Emergency Department (HOSPITAL_COMMUNITY): Payer: Medicare Other

## 2015-03-07 ENCOUNTER — Emergency Department (HOSPITAL_COMMUNITY)
Admission: EM | Admit: 2015-03-07 | Discharge: 2015-03-07 | Discharge status: Against Medical Advice | Payer: Medicare Other | Attending: Emergency Medicine | Admitting: Emergency Medicine

## 2015-03-07 ENCOUNTER — Encounter (HOSPITAL_COMMUNITY): Payer: Self-pay

## 2015-03-07 DIAGNOSIS — Z86718 Personal history of other venous thrombosis and embolism: Secondary | ICD-10-CM | POA: Insufficient documentation

## 2015-03-07 DIAGNOSIS — J441 Chronic obstructive pulmonary disease with (acute) exacerbation: Secondary | ICD-10-CM | POA: Diagnosis not present

## 2015-03-07 DIAGNOSIS — F319 Bipolar disorder, unspecified: Secondary | ICD-10-CM | POA: Diagnosis not present

## 2015-03-07 DIAGNOSIS — J029 Acute pharyngitis, unspecified: Secondary | ICD-10-CM | POA: Insufficient documentation

## 2015-03-07 DIAGNOSIS — M549 Dorsalgia, unspecified: Secondary | ICD-10-CM | POA: Insufficient documentation

## 2015-03-07 DIAGNOSIS — G43909 Migraine, unspecified, not intractable, without status migrainosus: Secondary | ICD-10-CM | POA: Insufficient documentation

## 2015-03-07 DIAGNOSIS — R000 Tachycardia, unspecified: Secondary | ICD-10-CM | POA: Diagnosis not present

## 2015-03-07 DIAGNOSIS — Z3202 Encounter for pregnancy test, result negative: Secondary | ICD-10-CM | POA: Diagnosis not present

## 2015-03-07 DIAGNOSIS — Z720 Tobacco use: Secondary | ICD-10-CM | POA: Diagnosis not present

## 2015-03-07 DIAGNOSIS — I10 Essential (primary) hypertension: Secondary | ICD-10-CM | POA: Insufficient documentation

## 2015-03-07 DIAGNOSIS — Z79899 Other long term (current) drug therapy: Secondary | ICD-10-CM | POA: Diagnosis not present

## 2015-03-07 DIAGNOSIS — R0602 Shortness of breath: Secondary | ICD-10-CM | POA: Diagnosis present

## 2015-03-07 DIAGNOSIS — G8929 Other chronic pain: Secondary | ICD-10-CM | POA: Insufficient documentation

## 2015-03-07 LAB — CBC WITH DIFFERENTIAL/PLATELET
BASOS ABS: 0 10*3/uL (ref 0.0–0.1)
Basophils Relative: 0 %
EOS ABS: 0 10*3/uL (ref 0.0–0.7)
Eosinophils Relative: 0 %
HCT: 35.5 % — ABNORMAL LOW (ref 36.0–46.0)
HEMOGLOBIN: 11.8 g/dL — AB (ref 12.0–15.0)
LYMPHS ABS: 1.4 10*3/uL (ref 0.7–4.0)
LYMPHS PCT: 13 %
MCH: 23.9 pg — AB (ref 26.0–34.0)
MCHC: 33.2 g/dL (ref 30.0–36.0)
MCV: 71.9 fL — ABNORMAL LOW (ref 78.0–100.0)
Monocytes Absolute: 0.4 10*3/uL (ref 0.1–1.0)
Monocytes Relative: 4 %
NEUTROS PCT: 83 %
Neutro Abs: 8.6 10*3/uL — ABNORMAL HIGH (ref 1.7–7.7)
Platelets: 429 10*3/uL — ABNORMAL HIGH (ref 150–400)
RBC: 4.94 MIL/uL (ref 3.87–5.11)
RDW: 18.4 % — ABNORMAL HIGH (ref 11.5–15.5)
WBC: 10.4 10*3/uL (ref 4.0–10.5)

## 2015-03-07 LAB — I-STAT BETA HCG BLOOD, ED (MC, WL, AP ONLY)

## 2015-03-07 LAB — I-STAT CG4 LACTIC ACID, ED: LACTIC ACID, VENOUS: 0.97 mmol/L (ref 0.5–2.0)

## 2015-03-07 MED ORDER — BENZONATATE 100 MG PO CAPS: 100.0000 mg | ORAL_CAPSULE | Freq: Three times a day (TID) | ORAL | Status: DC

## 2015-03-07 MED ORDER — PREDNISONE 20 MG PO TABS
60.0000 mg | ORAL_TABLET | Freq: Once | ORAL | Status: AC
Start: 2015-03-07 — End: 2015-03-07
  Administered 2015-03-07: 60 mg via ORAL
  Filled 2015-03-07: qty 3

## 2015-03-07 MED ORDER — ALBUTEROL SULFATE HFA 108 (90 BASE) MCG/ACT IN AERS: 1.0000 | INHALATION_SPRAY | RESPIRATORY_TRACT | Status: DC | PRN

## 2015-03-07 MED ORDER — SODIUM CHLORIDE 0.9 % IV BOLUS (SEPSIS)
500.0000 mL | Freq: Once | INTRAVENOUS | Status: AC
  Administered 2015-03-07: 09:00:00 via INTRAVENOUS

## 2015-03-07 MED ORDER — ACETAMINOPHEN 325 MG PO TABS
975.0000 mg | ORAL_TABLET | Freq: Once | ORAL | Status: AC
  Administered 2015-03-07: 975 mg via ORAL
  Filled 2015-03-07: qty 3

## 2015-03-07 MED ORDER — LEVOFLOXACIN 750 MG PO TABS: 750.0000 mg | ORAL_TABLET | Freq: Every day | ORAL | Status: DC

## 2015-03-07 MED ORDER — PREDNISONE 50 MG PO TABS: ORAL_TABLET | ORAL | Status: DC

## 2015-03-07 MED ORDER — IPRATROPIUM-ALBUTEROL 0.5-2.5 (3) MG/3ML IN SOLN
3.0000 mL | Freq: Once | RESPIRATORY_TRACT | Status: AC
  Administered 2015-03-07: 3 mL via RESPIRATORY_TRACT
  Filled 2015-03-07: qty 3

## 2015-03-07 NOTE — ED Notes (Signed)
Attempted iv x3, pt restless on bed, will have another rn attempt. Pt has multiple scars cut marks to wrist and fa, scar tissue.

## 2015-03-07 NOTE — ED Notes (Signed)
Pt reported to tech that she had to leave and would not stay. Pt states that she can not wait. Reported to [facility].

## 2015-03-07 NOTE — Discharge Instructions (Signed)
Do not hesitate to return to the emergency room for any new, worsening or concerning symptoms.  Please obtain primary care using resource guide below. Let them know that you were seen in the emergency room and that they will need to obtain records for further outpatient management.   Smoking Cessation, Tips for Success If you are ready to quit smoking, congratulations! You have chosen to help yourself be healthier. Cigarettes bring nicotine, tar, carbon monoxide, and other irritants into your body. Your lungs, heart, and blood vessels will be able to work better without these poisons. There are many different ways to quit smoking. Nicotine gum, nicotine patches, a nicotine inhaler, or nicotine nasal spray can help with physical craving. Hypnosis, support groups, and medicines help break the habit of smoking. WHAT THINGS CAN I DO TO MAKE QUITTING EASIER?  Here are some tips to help you quit for good:  Pick a date when you will quit smoking completely. Tell all of your friends and family about your plan to quit on that date.  Do not try to slowly cut down on the number of cigarettes you are smoking. Pick a quit date and quit smoking completely starting on that day.  Throw away all cigarettes.   Clean and remove all ashtrays from your home, work, and car.  On a card, write down your reasons for quitting. Carry the card with you and read it when you get the urge to smoke.  Cleanse your body of nicotine. Drink enough water and fluids to keep your urine clear or pale yellow. Do this after quitting to flush the nicotine from your body.  Learn to predict your moods. Do not let a bad situation be your excuse to have a cigarette. Some situations in your life might tempt you into wanting a cigarette.  Never have "just one" cigarette. It leads to wanting another and another. Remind yourself of your decision to quit.  Change habits associated with smoking. If you smoked while driving or when feeling  stressed, try other activities to replace smoking. Stand up when drinking your coffee. Brush your teeth after eating. Sit in a different chair when you read the paper. Avoid alcohol while trying to quit, and try to drink fewer caffeinated beverages. Alcohol and caffeine may urge you to smoke.  Avoid foods and drinks that can trigger a desire to smoke, such as sugary or spicy foods and alcohol.  Ask people who smoke not to smoke around you.  Have something planned to do right after eating or having a cup of coffee. For example, plan to take a walk or exercise.  Try a relaxation exercise to calm you down and decrease your stress. Remember, you may be tense and nervous for the first 2 weeks after you quit, but this will pass.  Find new activities to keep your hands busy. Play with a pen, coin, or rubber band. Doodle or draw things on paper.  Brush your teeth right after eating. This will help cut down on the craving for the taste of tobacco after meals. You can also try mouthwash.   Use oral substitutes in place of cigarettes. Try using lemon drops, carrots, cinnamon sticks, or chewing gum. Keep them handy so they are available when you have the urge to smoke.  When you have the urge to smoke, try deep breathing.  Designate your home as a nonsmoking area.  If you are a heavy smoker, ask your health care provider about a prescription for nicotine chewing  gum. It can ease your withdrawal from nicotine.  Reward yourself. Set aside the cigarette money you save and buy yourself something nice.  Look for support from others. Join a support group or smoking cessation program. Ask someone at home or at work to help you with your plan to quit smoking.  Always ask yourself, "Do I need this cigarette or is this just a reflex?" Tell yourself, "Today, I choose not to smoke," or "I do not want to smoke." You are reminding yourself of your decision to quit.  Do not replace cigarette smoking with  electronic cigarettes (commonly called e-cigarettes). The safety of e-cigarettes is unknown, and some may contain harmful chemicals.  If you relapse, do not give up! Plan ahead and think about what you will do the next time you get the urge to smoke. HOW WILL I FEEL WHEN I QUIT SMOKING? You may have symptoms of withdrawal because your body is used to nicotine (the addictive substance in cigarettes). You may crave cigarettes, be irritable, feel very hungry, cough often, get headaches, or have difficulty concentrating. The withdrawal symptoms are only temporary. They are strongest when you first quit but will go away within 10-14 days. When withdrawal symptoms occur, stay in control. Think about your reasons for quitting. Remind yourself that these are signs that your body is healing and getting used to being without cigarettes. Remember that withdrawal symptoms are easier to treat than the major diseases that smoking can cause.  Even after the withdrawal is over, expect periodic urges to smoke. However, these cravings are generally short lived and will go away whether you smoke or not. Do not smoke! WHAT RESOURCES ARE AVAILABLE TO HELP ME QUIT SMOKING? Your health care provider can direct you to community resources or hospitals for support, which may include:  Group support.  Education.  Hypnosis.  Therapy.   This information is not intended to replace advice given to you by your health care provider. Make sure you discuss any questions you have with your health care provider.   Document Released: 02/06/2004 Document Revised: 05/31/2014 Document Reviewed: 10/26/2012 Elsevier Interactive Patient Education 2016 [employer].   Emergency Department Resource Guide 1) Find a Doctor and Pay Out of Pocket Although you won't have to find out who is covered by your insurance plan, it is a good idea to ask around and get recommendations. You will then need to call the office and see if the doctor you  have chosen will accept you as a new patient and what types of options they offer for patients who are self-pay. Some doctors offer discounts or will set up payment plans for their patients who do not have insurance, but you will need to ask so you aren't surprised when you get to your appointment.  2) Contact Your Local Health Department Not all health departments have doctors that can see patients for sick visits, but many do, so it is worth a call to see if yours does. If you don't know where your local health department is, you can check in your phone book. The CDC also has a tool to help you locate your state's health department, and many state websites also have listings of all of their local health departments.  3) Find a [facility] If your illness is not likely to be very severe or complicated, you may want to try a walk in clinic. These are popping up all over the country in pharmacies, drugstores, and shopping centers. They're usually  staffed by nurse practitioners or physician assistants that have been trained to treat common illnesses and complaints. They're usually fairly quick and inexpensive. However, if you have serious medical issues or chronic medical problems, these are probably not your best option.  No Primary Care Doctor: - Call Health Connect at  [phone] - they can help you locate a primary care doctor that  accepts your insurance, provides certain services, etc. - Physician Referral Service- [phone]  Chronic Pain Problems: Organization         Address  Phone   Notes  [facility]  [phone] Patients need to be referred by their primary care doctor.   Medication Assistance: Organization         Address  Phone   Notes  [facility] Medication [facility] [address] [place], [place] [address] [phone] --Must be a resident of [facility] -- Must have NO insurance coverage whatsoever (no Medicaid/ Medicare,  etc.) -- The pt. MUST have a primary care doctor that directs their care regularly and follows them in the community   Med[facility]ist  [phone]   [employer]  [phone]    Agencies that provide inexpensive medical care: Organization         Address  Phone   Notes  [place]  [phone]   [facility]    [phone]   [facility] [place], [place] [address] [phone]   [place] [address] [place] [phone]   Planned Parenthood    [phone]   [facility]    [phone]   [place] and [place] [address], [place] Phone:  [phone], Fax:  [phone] Hours of Operation:  9 am - 6 pm, M-F.  Also accepts Medicaid/Medicare and self-pay.  [facility] for [place], [address], [place] Phone: [phone], Fax: [phone]. Hours of Operation:  8:30 am - 5:30 pm, M-F.  Also accepts Medicaid and self-pay.  [facility] [place] [address], [place] Phone: [phone]   [place], [place], [place] [phone], Ext. [phone] Mondays & Thursdays: 7-9 AM.  First 15 patients are seen on a first come, first serve basis.    [place] Providers:  Organization         Address  Phone   Notes  [facility] [address], [place] [phone] Also accepts self-pay patients.  [facility] [address], [place]  [phone]   [place], [address], [place] [phone]   [facility] [address], [place] [phone]   Lucianne Lei [address], [place]   [phone] Only accepts [place] Access [place] patients after they have their name applied to their card.   Self-Pay (no  insurance) in [facility]:  Organization         Address  Phone   Notes  Sickle Cell Patients, [facility] Internal Medicine [place] [phone]   [facility] [place] [phone]   [facility]es Urgent [place]  [place], [address], [place] [phone]   [facility]/Dr. Osei-Bonsu  [address], [place] or [address], [place] [phone] Phone number for both [place] and [place] locations is the same.  Urgent Medical and [facility] [address], [place] [phone]   [facility] [address] [place] or [address] [phone]   [facility] [address], [place] [phone], phone; [phone], fax Sees patients 1st and 3rd Saturday of every month.  Must not qualify for public or private insurance (i.e. Medicaid, Medicare, [place] Health Choice, Veterans' Benefits)  Household income should be no more than 200% of the poverty level The clinic cannot treat you if you are pregnant or think you are pregnant  Sexually transmitted diseases are not treated at the clinic.    Dental Care: Organization         Address  Phone  Notes  [facility] Department of [facility] [place] [phone] Accepts children up to age 22 who are enrolled in [place] or [place]; pregnant women with a Medicaid card; and children who have applied for Medicaid or [place] Health Choice, but were declined, whose parents can pay a reduced fee at time of service.  [facility] Department of [facility]  [address], [place] [phone] Accepts children up to age 45 who are enrolled in [place] or [place]; pregnant women with a Medicaid card; and children who have applied for Medicaid or [place] Health Choice, but were  declined, whose parents can pay a reduced fee at time of service.  [place] Adult Dental Access PROGRAM  [place] [phone] Patients are seen by appointment only. Walk-ins are not accepted. [place] will see patients 85 years of age and older. Monday - Tuesday (8am-5pm) Most Wednesdays (8:30-5pm) $30 per visit, cash only  [facility] Adult Dental Access PROGRAM  [address], [facility] [phone] Patients are seen by appointment only. Walk-ins are not accepted. [place] will see patients 52 years of age and older. One Wednesday Evening (Monthly: Volunteer Based).  $30 per visit, cash only  [place]  [phone] for adults; Children under age 26, call Graduate Pediatric Dentistry at [phone]. Children aged 28-14, please call [phone] to request a pediatric application.  Dental services are provided in all areas of dental care including fillings, crowns and bridges, complete and partial dentures, implants, gum treatment, root canals, and extractions. Preventive care is also provided. Treatment is provided to both adults and children. Patients are selected via a lottery and there is often a waiting list.   [facility] [address] [place]  [phone] [email or web address]   [facility] [address] [place], [place] [phone], Ext. [phone] Second and Fourth Thursday of each month, opens at 6:30 AM; Clinic ends at 9 AM.  Patients are seen on a first-come first-served basis, and a limited number are seen during each clinic.   [facility]  [address] Hillard Danker [place], [place] [phone]   Eligibility Requirements You must have lived in [place], [place], or [place] counties for at least the last three months.   You cannot be eligible for state or federal sponsored [employer], including [employer], [place], or [employer].   You generally cannot be  eligible for healthcare insurance through your  employer.    How to apply: Eligibility screenings are held every Tuesday and Wednesday afternoon from 1:00 pm until 4:00 pm. You do not need an appointment for the interview!  [facility] [address] [place], [place]   [place]  [employer] Department  [place]  [phone]    Behavioral Health Resources in the Community: Intensive Outpatient Programs Organization         Address  Phone  Notes  [place]. [address], [place], [place] [phone]   [facility] Outpatient [address] [place], [place]   ADS: Alcohol & Drug Svcs [address], [place], [place]   [place] [address],  [place], [place] or [phone]   Substance Abuse Resources Organization         Address  Phone  Notes  Alcohol and Drug Services  [phone]   [place]  [phone]   [employer]   Chinita Pester  [phone]   Residential & Outpatient Substance Abuse Program  [phone]   Psychological Services Organization         Address  Phone  Notes  [facility] [place]  [place]  [phone]   [place] [address] [place] or [phone]    Mobile Crisis Teams Organization         Address  Phone  Notes  [facility], Mobile Crisis Care Unit  [phone]   [facility]  [address] [place], [place]   [place] [address], [place] [phone]    Self-Help/Support Groups Organization         Address  Phone             Notes  [place]. of [place] - variety of support groups  [place] Call for more information    Narcotics Anonymous (NA), [facility] [address], [employer] [place]  2 meetings at this location   Special educational needs teacher         Address  Phone  Notes  [facility] Residential Treatment [place],    [place]  [phone]   [facility]  [address], [place] [address], [place], [place]   [place], [place] [phone] Admissions: 8am-3pm M-F  Incentives Substance [place] [address]    [place], [place] [phone]   [facility] [address] [place], [place], [place]   [facility] [address]  [place], [place]   [facility] - Intensive Outpatient [address] Kristeen Mans [address], [place], [place]   [facility] ([place].,  [place], [place] [phone] or [phone]   [facility] (RTS) [address] [place], [place] Accepts Medicaid  [facility] [place] [address],  [place] [phone] Substance Abuse/Addiction Treatment   [facility] Organization         Address  Phone  Notes  [facility]  [phone]   Domenic Schwab, PhD [address] [place], [place]   [phone] or [phone]   [facility] [place], [place] [phone]  [facility] [address] [place], [place] [phone] Insurance/Medicaid/sponsorship through [employer] and Families [address] [place], [place] [phone] [place], [place] [phone]    Dr. Adele Schilder  [phone]   [facility] Dept. 1) [address] [place] 2) [place] 3)  [place] [address], [place] [phone]  [phone]   [place] [phone] or [phone] (After Hours)

## 2015-03-07 NOTE — ED Notes (Signed)
Patient transported to X-ray 

## 2015-03-07 NOTE — ED Provider Notes (Signed)
CSN: [id number]     Arrival date & time 03/07/15  6160 History   First MD Initiated Contact with Patient 03/07/15 [phone]     Chief Complaint  Patient presents with  . Shortness of Breath  . Back Pain  . Sore Throat     (Consider location/radiation/quality/duration/timing/severity/associated sxs/prior Treatment) HPI   Blood pressure 133/88, pulse 104, temperature 98.1 F (36.7 C), temperature source Oral, resp. rate 18, last menstrual period 02/11/2015, SpO2 100 %.  ODETH BRY is a 39 y.o. female with past medical history significant for hypertension, bipolar, tobacco use complaining of persistent active cough, short shortness of breath, sore throat, tactile fever, pleuritic chest pain, body aches onset 1 week ago. States she was seen one month ago and symptoms have not improved. Continues to smoke 1 pack per day. She endorses diffuse myalgia, denies nausea, vomiting, change in bowel or bladder habits. 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 Patient has been taking Lortab at home with little relief.  Past Medical History  Diagnosis Date  . Hypertension   . Chronic back pain   . Migraine headache   . Stress incontinence   . Sciatica   . Bipolar disorder [facility])    Past Surgical History  Procedure Laterality Date  . Fracture surgery    . Back surgery    . Cesarean section    . Gastric bypass    . Ankle surgery    . Lumbar disc surgery    . Lumbar fusion    . Tubal ligation     Family History  Problem Relation Age of Onset  . Diabetes Mother   . Hypertension Mother   . Diabetes Father   . Hypertension Father    Social History  Substance Use Topics  . Smoking status: Current Every Day Smoker -- 1.00 packs/day    Types: Cigarettes  . Smokeless tobacco: Never Used  . Alcohol Use: No   OB History    No data available     Review of Systems  10 systems reviewed and found to be negative, except as noted in the  HPI.   Allergies  Ketorolac tromethamine and Methocarbamol  Home Medications   Prior to Admission medications   Medication Sig Start Date End Date Taking? Authorizing Provider  acetaminophen (TYLENOL) 500 MG tablet Take 1,000 mg by mouth every 6 (six) hours as needed for moderate pain (pain).   Yes Historical Provider, MD  amitriptyline (ELAVIL) 75 MG tablet Take 1 tablet (75 mg total) by mouth at bedtime. 02/11/14  Yes Patrecia Pour, NP  amLODipine (NORVASC) 10 MG tablet Take 10 mg by mouth daily.   Yes Historical Provider, MD  DULoxetine (CYMBALTA) 60 MG capsule Take 60 mg by mouth daily. 12/23/14  Yes Historical Provider, MD  gabapentin (NEURONTIN) 300 MG capsule Take 1 capsule (300 mg total) by mouth 3 (three) times daily. 02/11/14  Yes Patrecia Pour, NP  HYDROcodone-acetaminophen (NORCO) 7.5-325 MG per tablet Take 1 tablet by mouth 3 (three) times daily as needed (pain).  12/18/14  Yes Historical Provider, MD  albuterol (PROVENTIL HFA;VENTOLIN HFA) 108 (90 BASE) MCG/ACT inhaler Inhale 1-2 puffs into the lungs every 4 (four) hours as needed for wheezing or shortness of breath. 03/07/15   Enedelia Martorelli, PA-C  benzonatate (TESSALON) 100 MG capsule Take 1 capsule (100 mg total) by mouth every 8 (eight) hours. 03/07/15   Vesta Wheeland, PA-C  levofloxacin (LEVAQUIN) 750 MG  tablet Take 1 tablet (750 mg total) by mouth daily. X 7 days 03/07/15   Elmyra Ricks Renny Gunnarson, PA-C  predniSONE (DELTASONE) 50 MG tablet Take 1 tablet daily with breakfast 03/07/15   Iktan Aikman, PA-C   BP 133/88 mmHg  Pulse 104  Temp(Src) 98.1 F (36.7 C) (Oral)  Resp 18  SpO2 100%  LMP 02/11/2015 Physical Exam  Constitutional: She is oriented to person, place, and time. She appears well-developed and well-nourished. No distress.  HENT:  Head: Normocephalic.  Mouth/Throat: Oropharynx is clear and moist. No oropharyngeal exudate.      Eyes: Conjunctivae and EOM are normal.  Neck: Normal range of motion. Neck  supple. No JVD present. No tracheal deviation present.  Cardiovascular: Normal rate, regular rhythm, normal heart sounds and intact distal pulses.   Mild tachycardia  Pulmonary/Chest: Effort normal. No stridor. No respiratory distress. She has wheezes. She has no rales. She exhibits no tenderness.  Abdominal: Soft. She exhibits no distension and no mass. There is no tenderness. There is no rebound and no guarding.  Musculoskeletal: Normal range of motion. She exhibits no edema or tenderness.  No calf asymmetry, superficial collaterals, palpable cords, edema, Homans sign negative bilaterally.    Neurological: She is alert and oriented to person, place, and time.  Skin: Skin is warm. She is not diaphoretic.  Psychiatric: She has a normal mood and affect.  Nursing note and vitals reviewed.   ED Course  Procedures (including critical care time) Labs Review Labs Reviewed  CBC WITH DIFFERENTIAL/PLATELET - Abnormal; Notable for the following:    Hemoglobin 11.8 (*)    HCT 35.5 (*)    MCV 71.9 (*)    MCH 23.9 (*)    RDW 18.4 (*)    Platelets 429 (*)    Neutro Abs 8.6 (*)    All other components within normal limits  TROPONIN I  D-DIMER, QUANTITATIVE (NOT AT [facility])  BASIC METABOLIC PANEL  I-STAT CG4 LACTIC ACID, ED  I-STAT BETA HCG BLOOD, ED ([facility], [facility], [facility] ONLY)    Imaging Review Dg Chest 2 View  03/07/2015  CLINICAL DATA:  Cough, shortness of breath, mid chest and back pain. EXAM: CHEST  2 VIEW COMPARISON:  02/11/2015 FINDINGS: The heart size and mediastinal contours are within normal limits. Both lungs are clear. The visualized skeletal structures are unremarkable. IMPRESSION: No active cardiopulmonary disease. Electronically Signed   By: Rolm Baptise M.D.   On: 03/07/2015 09:24   I have personally reviewed and evaluated these images and lab results as part of my medical decision-making.   EKG Interpretation   Date/Time:  Friday March 07 2015 08:46:47 EDT Ventricular Rate:   100 PR Interval:  145 QRS Duration: 85 QT Interval:  344 QTC Calculation: 444 R Axis:   57 Text Interpretation:  Sinus tachycardia Right atrial enlargement  Nonspecific T abnormalities, lateral leads Baseline wander in lead(s) I II  aVR No significant change since last tracing Confirmed by POLLINA  MD,  CHRISTOPHER [phone]) on 03/07/2015 9:48:58 AM      MDM   Final diagnoses:  Tobacco use  COPD exacerbation (HCC)    Filed Vitals:   03/07/15 0847  BP: 133/88  Pulse: 104  Temp: 98.1 F (36.7 C)  TempSrc: Oral  Resp: 18  SpO2: 100%    Medications  sodium chloride 0.9 % bolus 500 mL (0 mLs Intravenous Stopped 03/07/15 0952)  ipratropium-albuterol (DUONEB) 0.5-2.5 (3) MG/3ML nebulizer solution 3 mL (3 mLs Nebulization Given 03/07/15 0932)  predniSONE (  DELTASONE) tablet 60 mg (60 mg Oral Given 03/07/15 0923)  acetaminophen (TYLENOL) tablet 975 mg (975 mg Oral Given 03/07/15 1021)    [name] is a pleasant 39 y.o. female presenting with active cough, chest pain, shortness of breath. Lung sounds with moderate wheezing. No respiratory distress, patient is saturating well on room air. She has mild tachycardia. EKG shows sinus tach, no acute changes. Patient will be started on DuoNeb, given prednisone. I've counseled this patient on smoking cessation.   Patient states she has to leave to pick up her daughter. She eloped without obtaining her discharge medications for COPD exacerbation. Canceled pending lab work.    New Prescriptions   ALBUTEROL (PROVENTIL HFA;VENTOLIN HFA) 108 (90 BASE) MCG/ACT INHALER    Inhale 1-2 puffs into the lungs every 4 (four) hours as needed for wheezing or shortness of breath.   BENZONATATE (TESSALON) 100 MG CAPSULE    Take 1 capsule (100 mg total) by mouth every 8 (eight) hours.   LEVOFLOXACIN (LEVAQUIN) 750 MG TABLET    Take 1 tablet (750 mg total) by mouth daily. X 7 days   PREDNISONE (DELTASONE) 50 MG TABLET    Take 1 tablet daily with breakfast          Monico Blitz, PA-C 03/07/15 [place], MD 03/08/15 2332

## 2015-03-07 NOTE — ED Notes (Signed)
Patiaent states she was hospitalized for a upper resp infection. Today, patient states she is having increased back, SOB, and sore throat.

## 2015-03-24 ENCOUNTER — Emergency Department (HOSPITAL_COMMUNITY): Payer: Medicare Other

## 2015-03-24 ENCOUNTER — Encounter (HOSPITAL_COMMUNITY): Payer: Self-pay | Admitting: *Deleted

## 2015-03-24 ENCOUNTER — Emergency Department (HOSPITAL_COMMUNITY)
Admission: EM | Admit: 2015-03-24 | Discharge: 2015-03-24 | Discharge status: Against Medical Advice | Payer: Medicare Other | Attending: Emergency Medicine | Admitting: Emergency Medicine

## 2015-03-24 DIAGNOSIS — I10 Essential (primary) hypertension: Secondary | ICD-10-CM | POA: Insufficient documentation

## 2015-03-24 DIAGNOSIS — W182XXA Fall in (into) shower or empty bathtub, initial encounter: Secondary | ICD-10-CM | POA: Diagnosis not present

## 2015-03-24 DIAGNOSIS — S3992XA Unspecified injury of lower back, initial encounter: Secondary | ICD-10-CM | POA: Diagnosis not present

## 2015-03-24 DIAGNOSIS — Y9389 Activity, other specified: Secondary | ICD-10-CM | POA: Insufficient documentation

## 2015-03-24 DIAGNOSIS — Y998 Other external cause status: Secondary | ICD-10-CM | POA: Insufficient documentation

## 2015-03-24 DIAGNOSIS — R200 Anesthesia of skin: Secondary | ICD-10-CM | POA: Insufficient documentation

## 2015-03-24 DIAGNOSIS — Y9289 Other specified places as the place of occurrence of the external cause: Secondary | ICD-10-CM | POA: Diagnosis not present

## 2015-03-24 DIAGNOSIS — Z720 Tobacco use: Secondary | ICD-10-CM | POA: Insufficient documentation

## 2015-03-24 DIAGNOSIS — G8929 Other chronic pain: Secondary | ICD-10-CM | POA: Diagnosis not present

## 2015-03-24 NOTE — ED Notes (Signed)
Staff saw pt getting into car to leave.

## 2015-03-24 NOTE — ED Notes (Signed)
Pt reports history of multiple back surgeries. Reports she fell in the tub this morning. Having lower back pain and right leg numbness.  Pain 10/10.

## 2015-04-28 ENCOUNTER — Ambulatory Visit: Payer: Self-pay

## 2015-08-21 ENCOUNTER — Ambulatory Visit: Payer: Self-pay

## 2015-08-23 IMAGING — CR DG CHEST 2V
2 series · 2 of 2 positions shown · non-contrast
Comparison: 06/01/2014

CLINICAL DATA: Left-sided chest pressure.  Pain started yesterday.

EXAM:
CHEST  2 VIEW

[w chest pa]
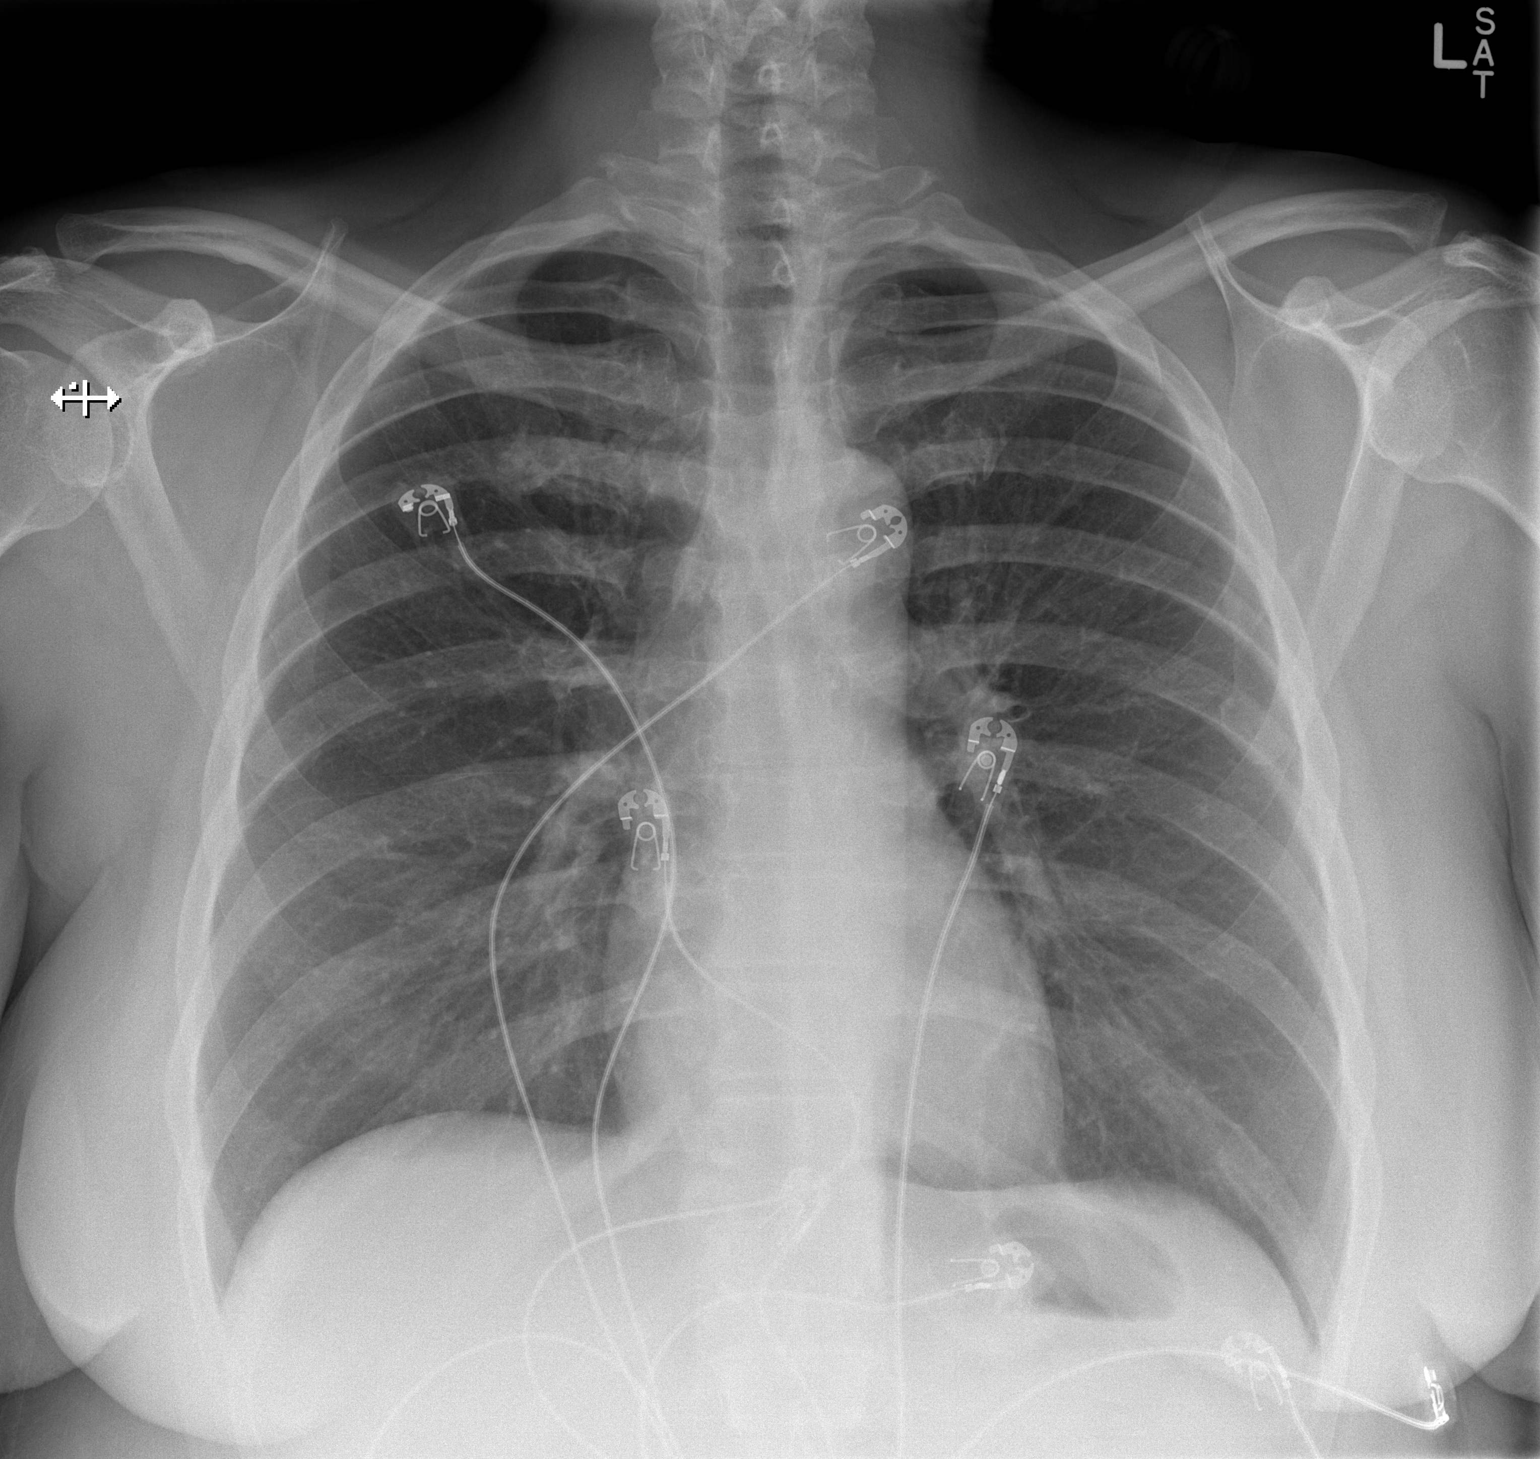

[w chest lat]
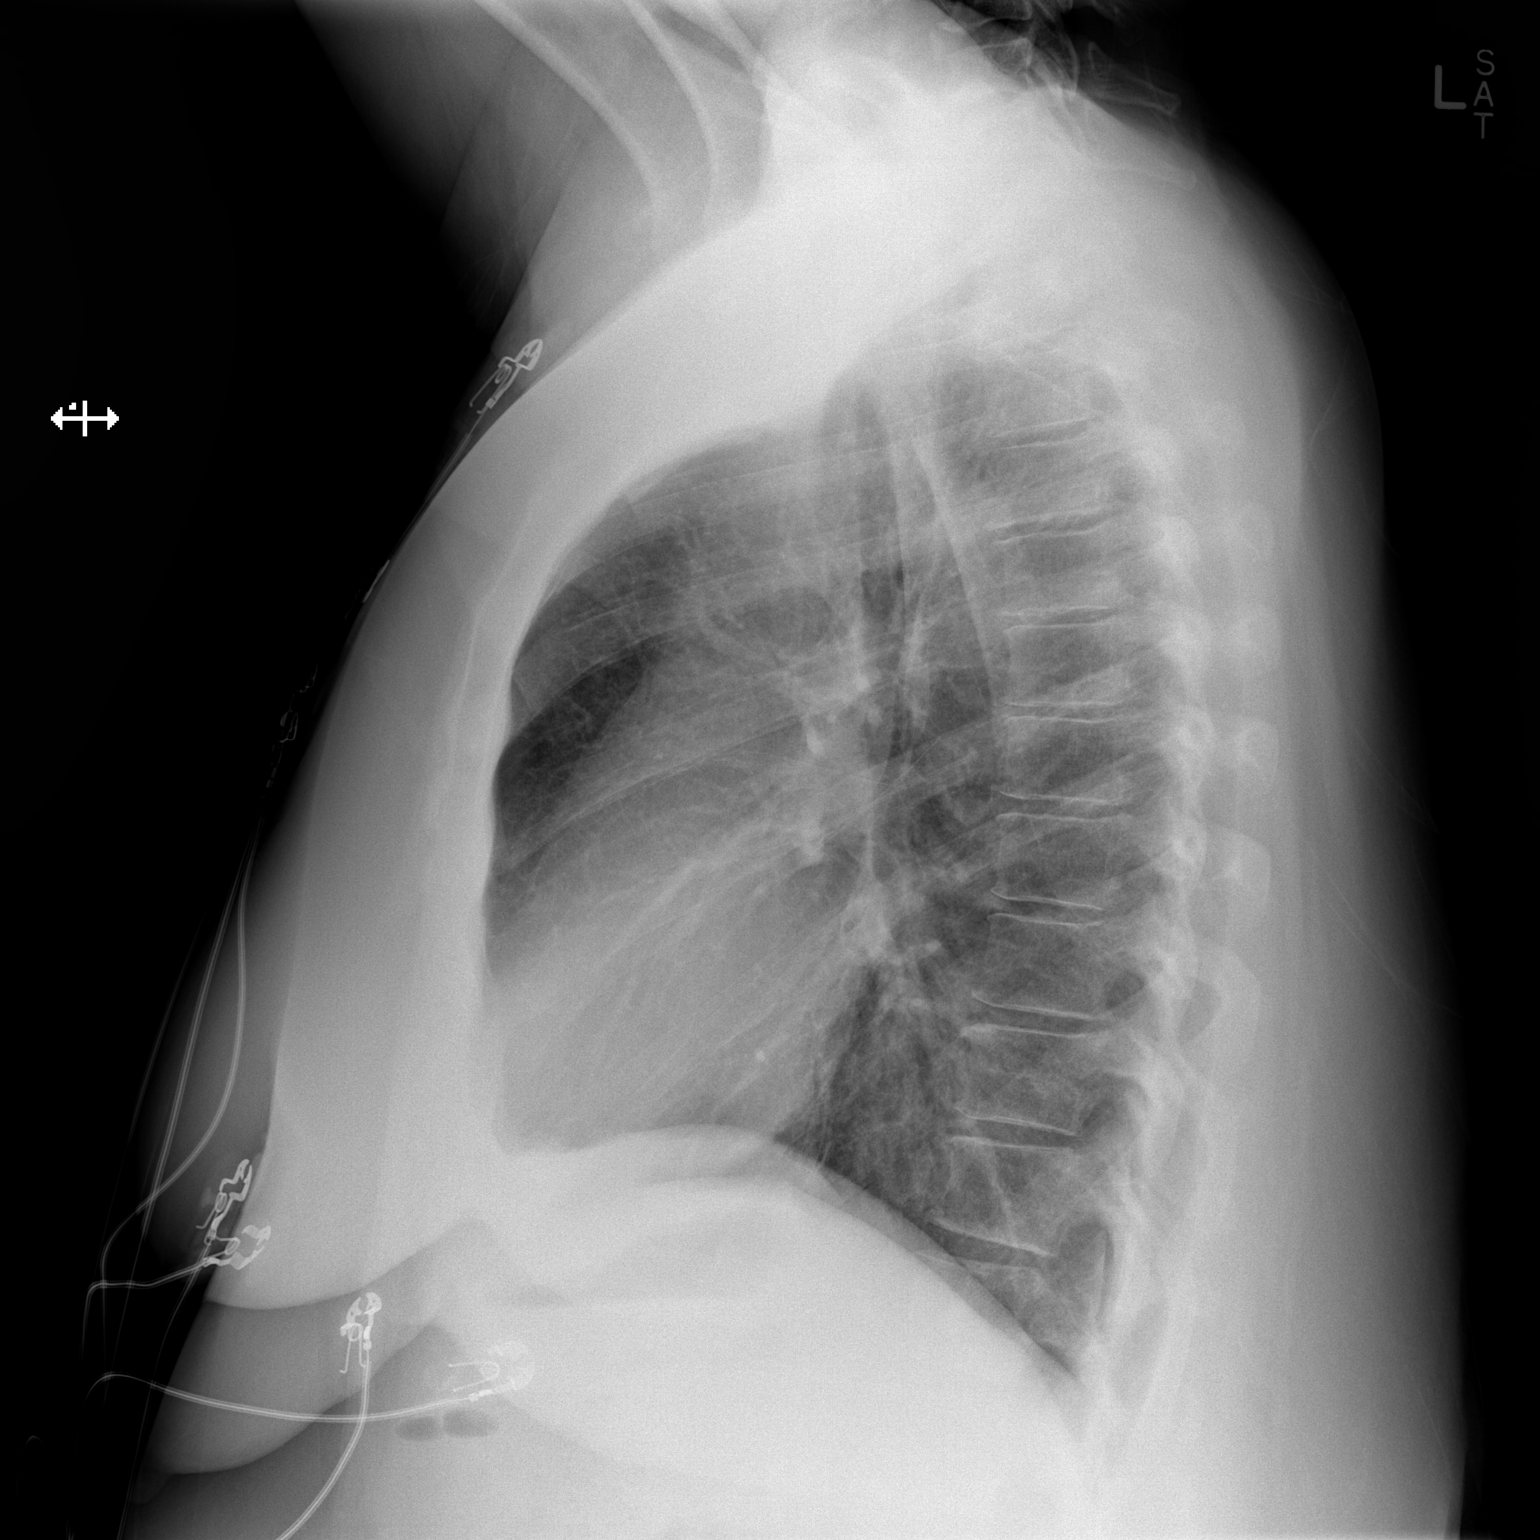

[2 of 2 positions shown; findings below may reference images not displayed]

FINDINGS: Both lungs are clear. Heart and mediastinum are within normal
limits. Trachea is midline. No acute bone abnormality. No pleural
effusions.
IMPRESSION: No active cardiopulmonary disease.

## 2015-08-26 ENCOUNTER — Ambulatory Visit: Payer: Self-pay

## 2015-09-18 ENCOUNTER — Other Ambulatory Visit: Payer: Self-pay | Admitting: Internal Medicine

## 2015-09-18 DIAGNOSIS — Z1231 Encounter for screening mammogram for malignant neoplasm of breast: Secondary | ICD-10-CM

## 2015-09-22 IMAGING — CR DG CHEST 2V
2 series · 2 of 2 positions shown · non-contrast
Comparison: 01/10/2015

CLINICAL DATA: Sweats chills body aches nonproductive cough fatigue
for 1 week

EXAM:
CHEST  2 VIEW

[chest pa]
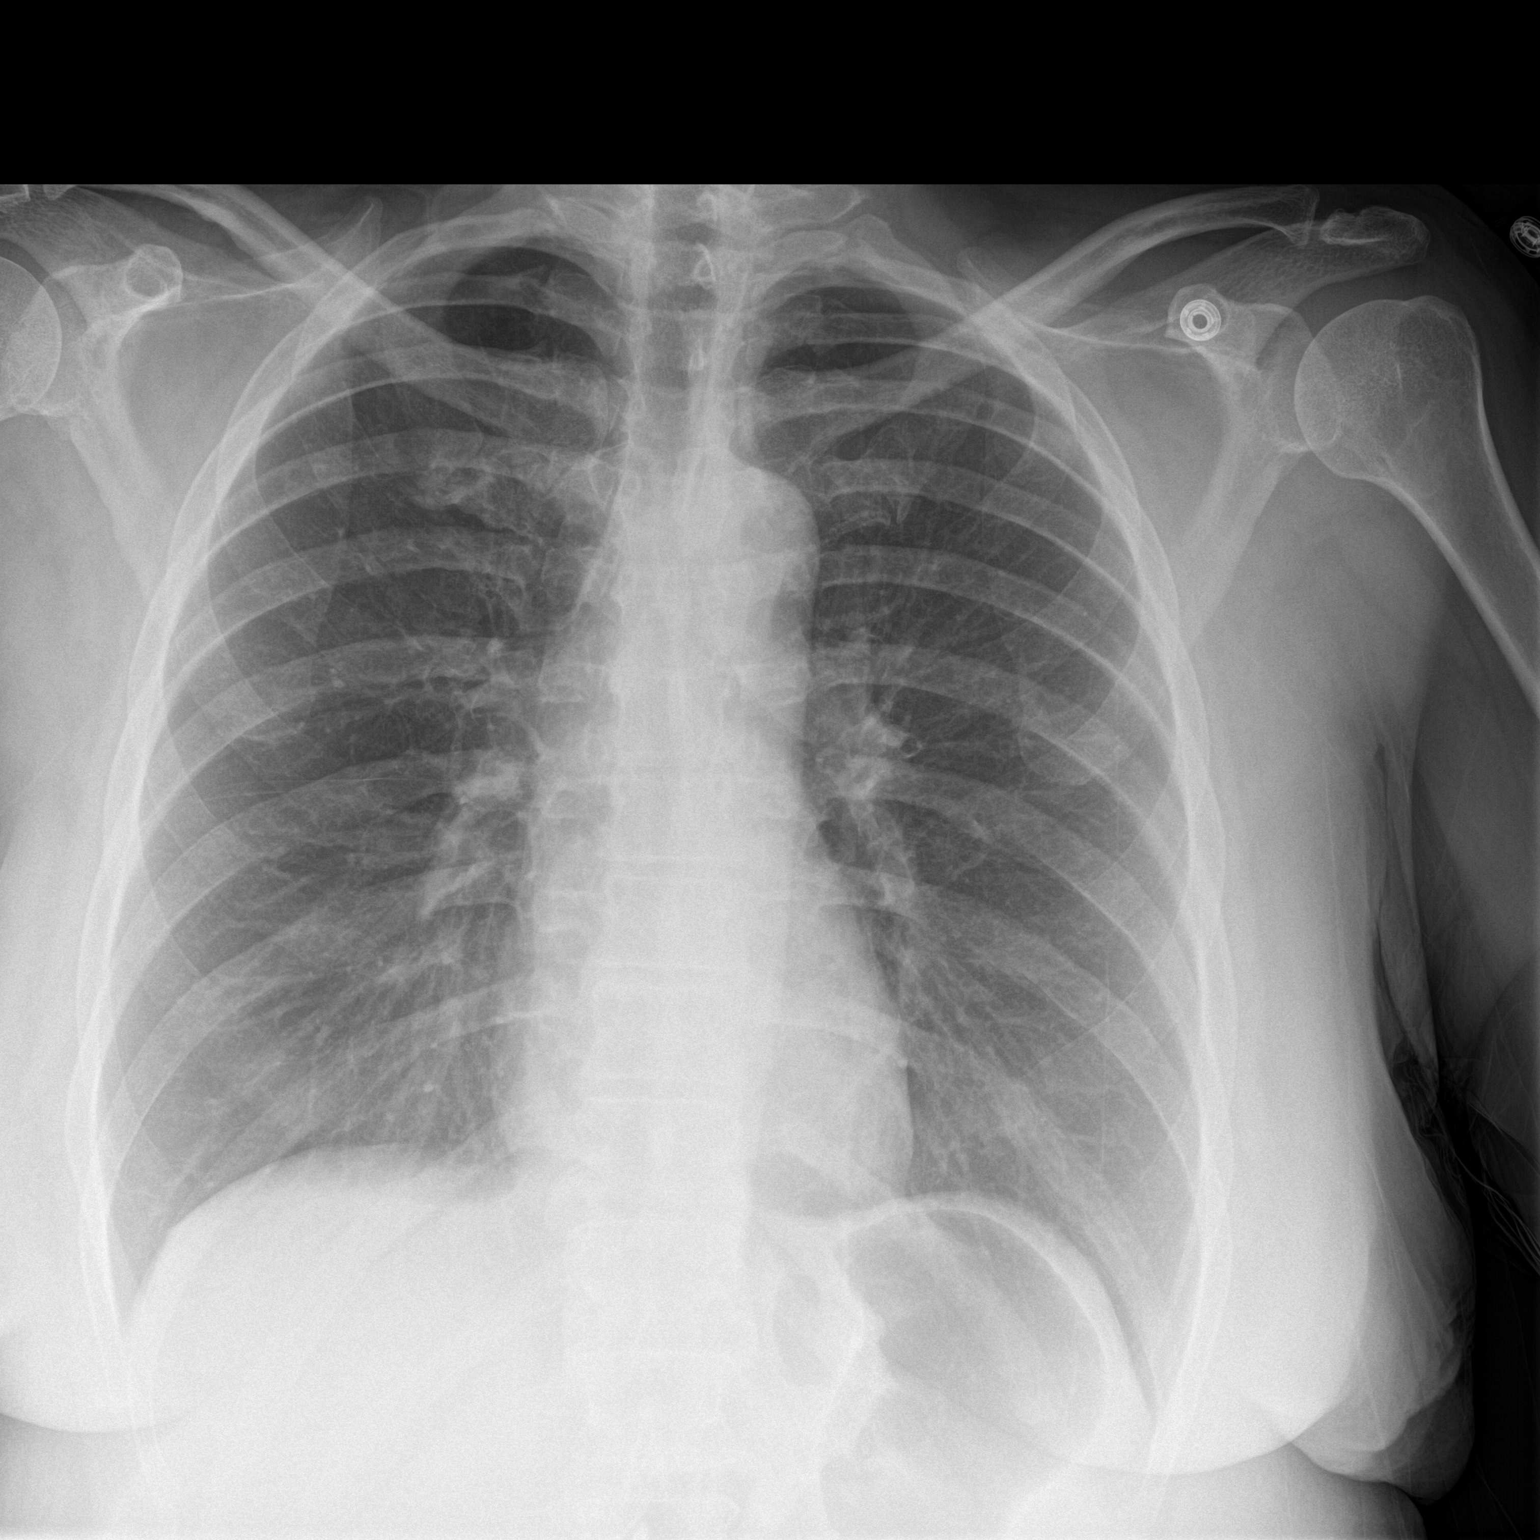

[chest lat]
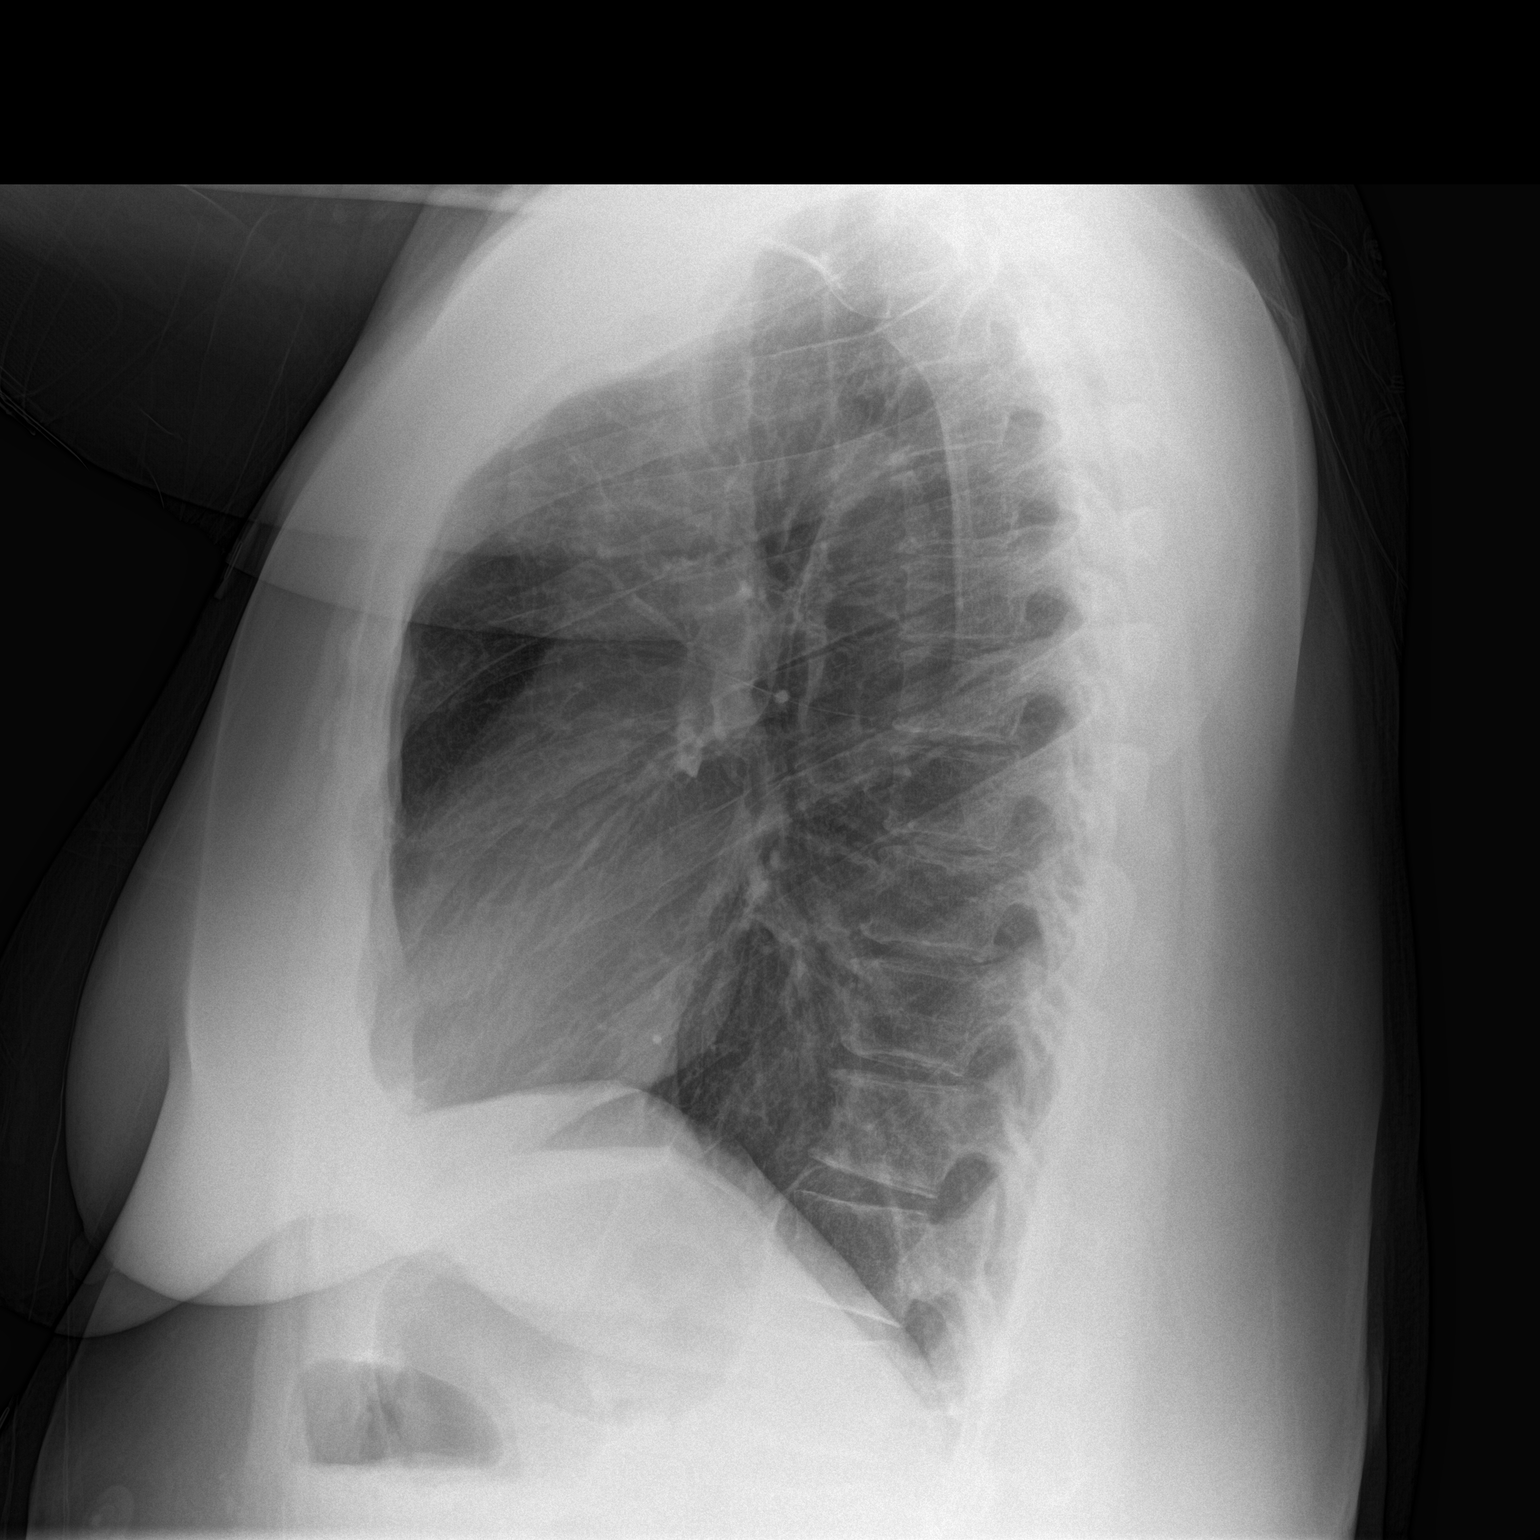

[2 of 2 positions shown; findings below may reference images not displayed]

FINDINGS: The heart size and mediastinal contours are within normal limits.
Both lungs are clear. The visualized skeletal structures are
unremarkable.
IMPRESSION: No active cardiopulmonary disease.

## 2015-09-24 IMAGING — CR DG CHEST 2V
2 series · 2 of 2 positions shown · non-contrast
Comparison: 01/30/2015

CLINICAL DATA: Chest pain

EXAM:
CHEST  2 VIEW

[w chest pa]
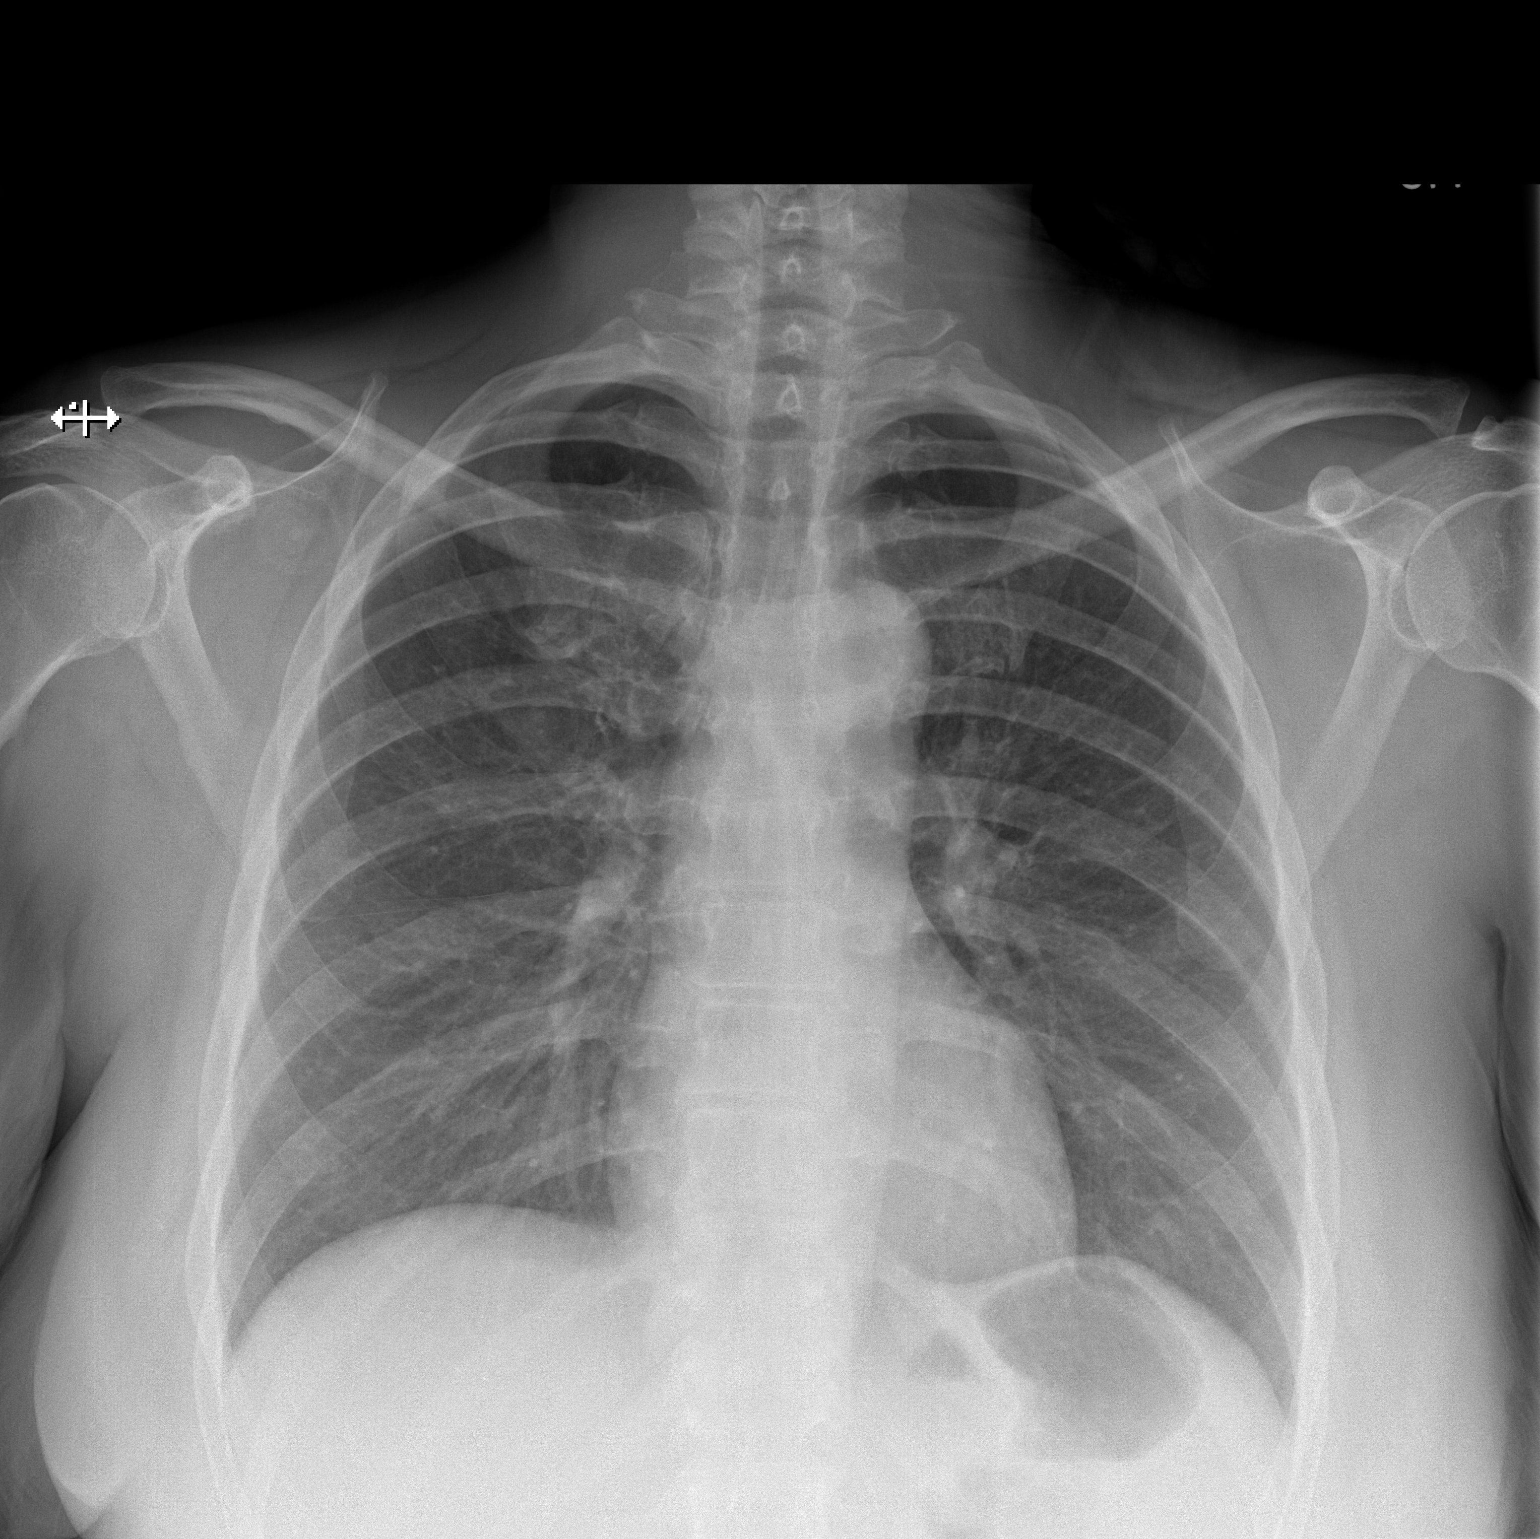

[w chest lat]
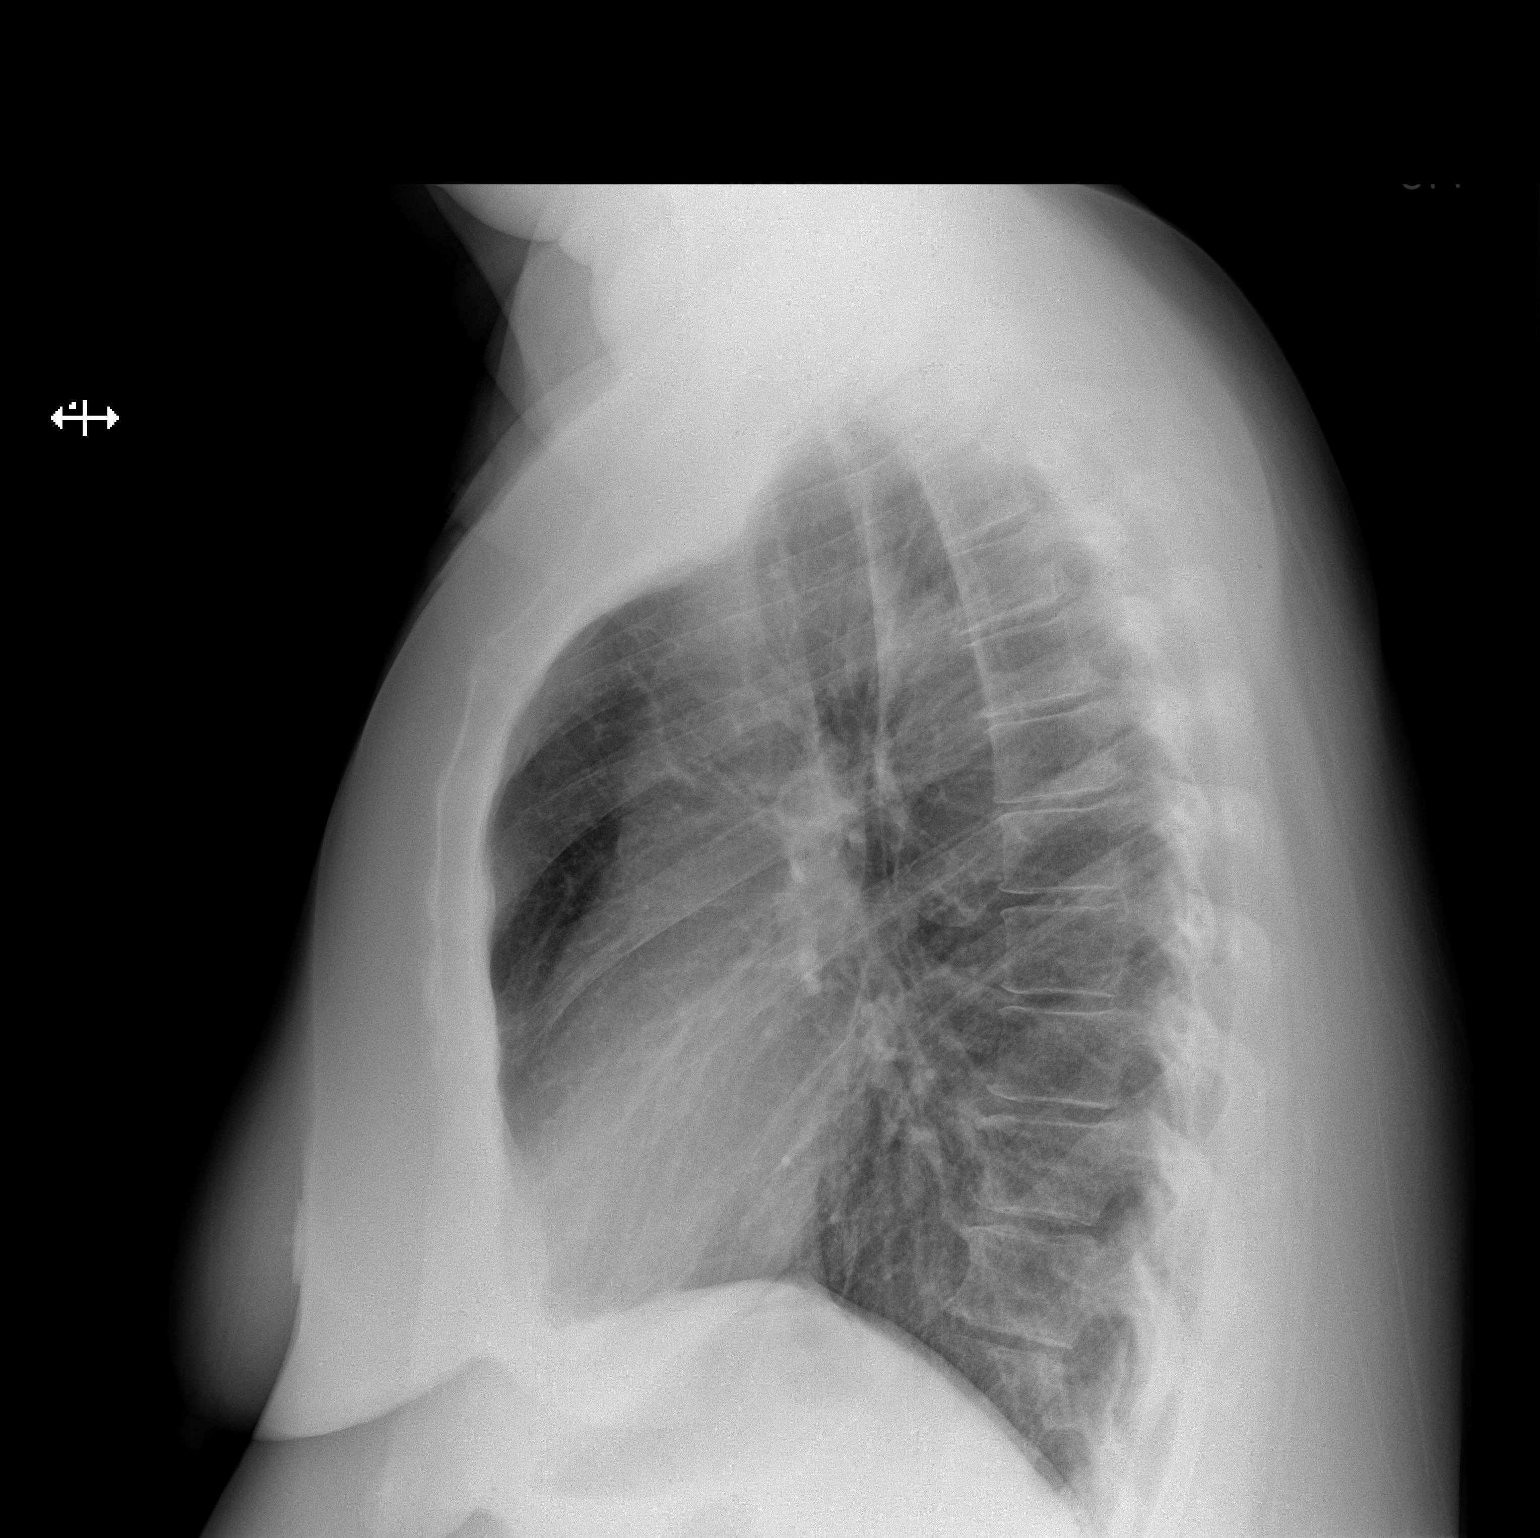

[2 of 2 positions shown; findings below may reference images not displayed]

FINDINGS: The heart size and mediastinal contours are within normal limits.
Both lungs are clear. The visualized skeletal structures are
unremarkable.
IMPRESSION: No active cardiopulmonary disease.

## 2015-10-14 ENCOUNTER — Ambulatory Visit: Payer: Self-pay

## 2015-10-18 IMAGING — CR DG CHEST 2V
2 series · 2 of 2 positions shown · non-contrast
Comparison: 02/11/2015

CLINICAL DATA: Cough, shortness of breath, mid chest and back pain.

EXAM:
CHEST  2 VIEW

[w chest pa]
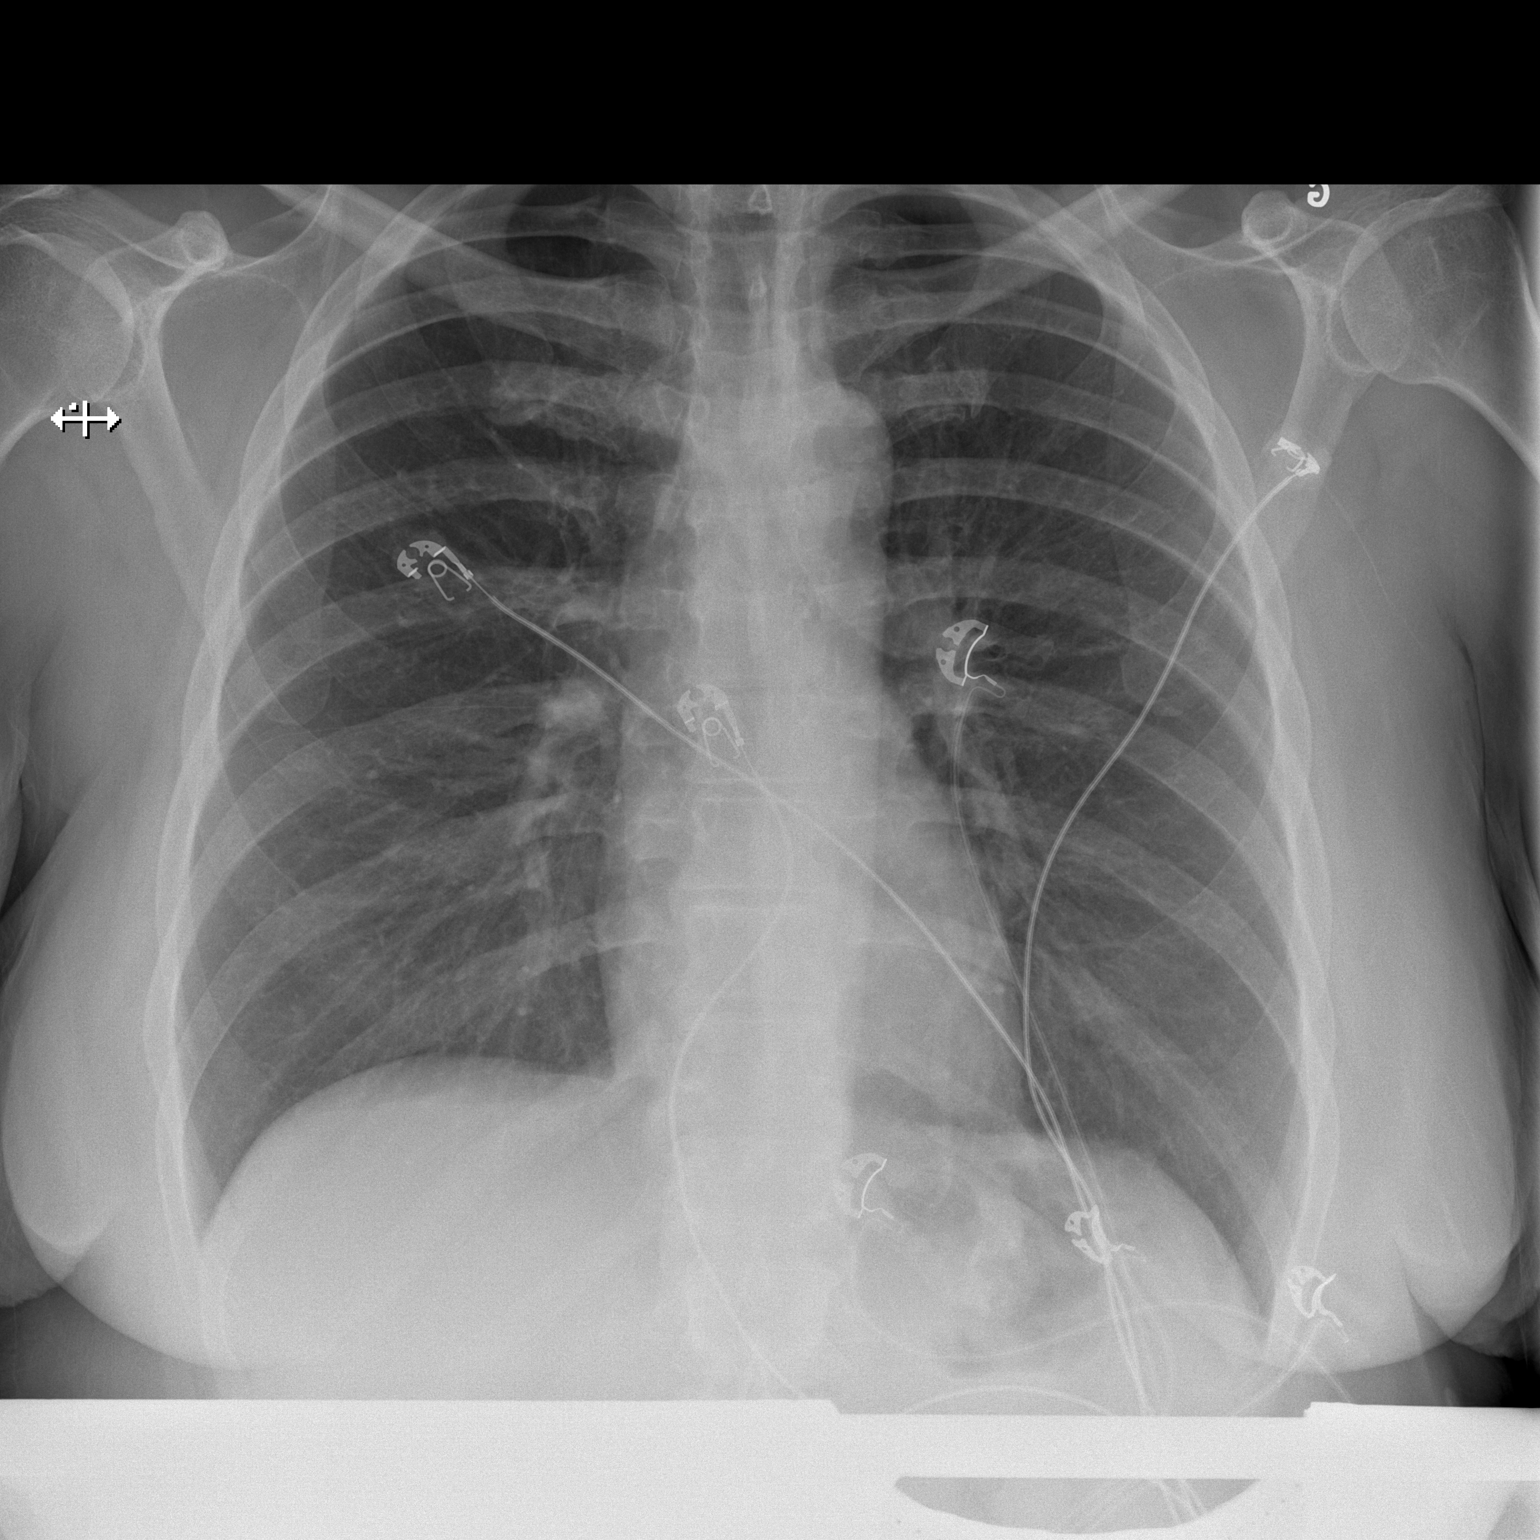

[w chest lat]
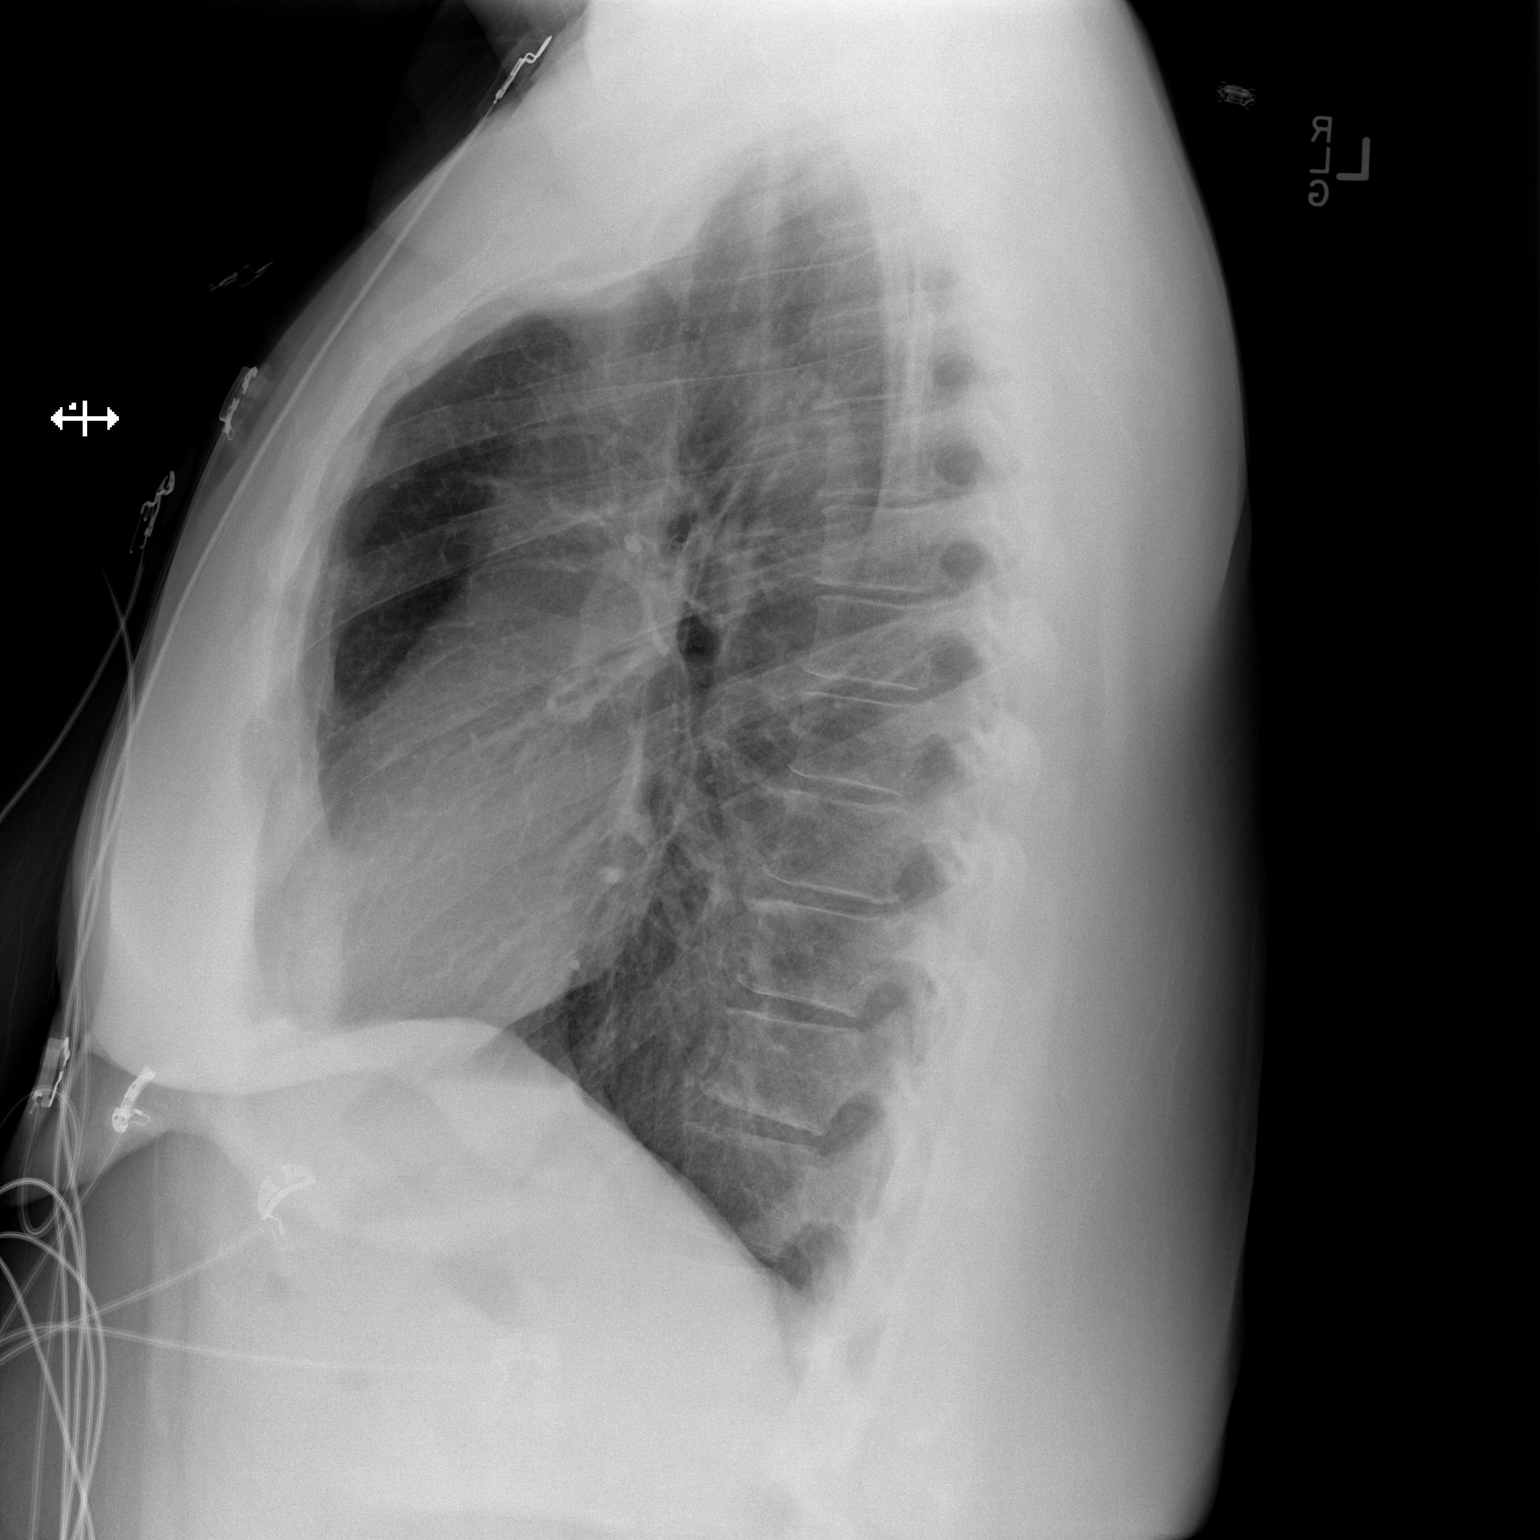

[2 of 2 positions shown; findings below may reference images not displayed]

FINDINGS: The heart size and mediastinal contours are within normal limits.
Both lungs are clear. The visualized skeletal structures are
unremarkable.
IMPRESSION: No active cardiopulmonary disease.

## 2016-01-07 ENCOUNTER — Ambulatory Visit: Payer: Medicare Other | Admitting: Physical Therapy

## 2016-01-15 ENCOUNTER — Ambulatory Visit: Payer: Medicare Other | Admitting: Physical Therapy

## 2016-01-30 ENCOUNTER — Ambulatory Visit: Payer: Medicare Other | Attending: Anesthesiology

## 2016-02-18 ENCOUNTER — Emergency Department (HOSPITAL_COMMUNITY)
Admission: EM | Admit: 2016-02-18 | Discharge: 2016-02-18 | Disposition: A | Discharge status: Home / Self Care | Payer: Medicare Other | Attending: Emergency Medicine | Admitting: Emergency Medicine

## 2016-02-18 ENCOUNTER — Emergency Department (HOSPITAL_COMMUNITY): Payer: Medicare Other

## 2016-02-18 ENCOUNTER — Encounter (HOSPITAL_COMMUNITY): Payer: Self-pay | Admitting: Emergency Medicine

## 2016-02-18 DIAGNOSIS — J069 Acute upper respiratory infection, unspecified: Secondary | ICD-10-CM | POA: Insufficient documentation

## 2016-02-18 DIAGNOSIS — M545 Low back pain: Secondary | ICD-10-CM | POA: Diagnosis present

## 2016-02-18 DIAGNOSIS — N390 Urinary tract infection, site not specified: Secondary | ICD-10-CM | POA: Diagnosis not present

## 2016-02-18 DIAGNOSIS — Z7982 Long term (current) use of aspirin: Secondary | ICD-10-CM | POA: Diagnosis not present

## 2016-02-18 DIAGNOSIS — F17210 Nicotine dependence, cigarettes, uncomplicated: Secondary | ICD-10-CM | POA: Insufficient documentation

## 2016-02-18 DIAGNOSIS — R509 Fever, unspecified: Secondary | ICD-10-CM

## 2016-02-18 DIAGNOSIS — I10 Essential (primary) hypertension: Secondary | ICD-10-CM | POA: Insufficient documentation

## 2016-02-18 DIAGNOSIS — W19XXXA Unspecified fall, initial encounter: Secondary | ICD-10-CM

## 2016-02-18 DIAGNOSIS — R0989 Other specified symptoms and signs involving the circulatory and respiratory systems: Secondary | ICD-10-CM | POA: Insufficient documentation

## 2016-02-18 DIAGNOSIS — M549 Dorsalgia, unspecified: Secondary | ICD-10-CM

## 2016-02-18 DIAGNOSIS — Z79899 Other long term (current) drug therapy: Secondary | ICD-10-CM | POA: Diagnosis not present

## 2016-02-18 LAB — CBC WITH DIFFERENTIAL/PLATELET
BASOS ABS: 0 10*3/uL (ref 0.0–0.1)
BASOS PCT: 0 %
EOS ABS: 0 10*3/uL (ref 0.0–0.7)
Eosinophils Relative: 0 %
HCT: 29.2 % — ABNORMAL LOW (ref 36.0–46.0)
Hemoglobin: 9.5 g/dL — ABNORMAL LOW (ref 12.0–15.0)
Lymphocytes Relative: 5 %
Lymphs Abs: 0.6 10*3/uL — ABNORMAL LOW (ref 0.7–4.0)
MCH: 22.1 pg — AB (ref 26.0–34.0)
MCHC: 32.5 g/dL (ref 30.0–36.0)
MCV: 67.9 fL — ABNORMAL LOW (ref 78.0–100.0)
MONO ABS: 0.5 10*3/uL (ref 0.1–1.0)
Monocytes Relative: 4 %
NEUTROS ABS: 11.2 10*3/uL — AB (ref 1.7–7.7)
Neutrophils Relative %: 91 %
PLATELETS: 276 10*3/uL (ref 150–400)
RBC: 4.3 MIL/uL (ref 3.87–5.11)
RDW: 19 % — ABNORMAL HIGH (ref 11.5–15.5)
WBC: 12.3 10*3/uL — ABNORMAL HIGH (ref 4.0–10.5)

## 2016-02-18 LAB — COMPREHENSIVE METABOLIC PANEL
ALBUMIN: 3 g/dL — AB (ref 3.5–5.0)
ALT: 10 U/L — AB (ref 14–54)
AST: 24 U/L (ref 15–41)
Alkaline Phosphatase: 95 U/L (ref 38–126)
Anion gap: 10 (ref 5–15)
BUN: 11 mg/dL (ref 6–20)
CHLORIDE: 102 mmol/L (ref 101–111)
CO2: 22 mmol/L (ref 22–32)
Calcium: 8.7 mg/dL — ABNORMAL LOW (ref 8.9–10.3)
Creatinine, Ser: 0.76 mg/dL (ref 0.44–1.00)
GFR calc Af Amer: >60 mL/min (ref >60)
GLUCOSE: 89 mg/dL (ref 65–99)
POTASSIUM: 3.7 mmol/L (ref 3.5–5.1)
SODIUM: 134 mmol/L — AB (ref 135–145)
Total Bilirubin: 0.9 mg/dL (ref 0.3–1.2)
Total Protein: 6.4 g/dL — ABNORMAL LOW (ref 6.5–8.1)

## 2016-02-18 LAB — URINE MICROSCOPIC-ADD ON

## 2016-02-18 LAB — URINALYSIS, ROUTINE W REFLEX MICROSCOPIC
Bilirubin Urine: NEGATIVE
GLUCOSE, UA: NEGATIVE mg/dL
Hgb urine dipstick: NEGATIVE
Ketones, ur: NEGATIVE mg/dL
Nitrite: POSITIVE — AB
PROTEIN: NEGATIVE mg/dL
SPECIFIC GRAVITY, URINE: 1.013 (ref 1.005–1.030)
pH: 5.5 (ref 5.0–8.0)

## 2016-02-18 LAB — I-STAT CG4 LACTIC ACID, ED
LACTIC ACID, VENOUS: 1.31 mmol/L (ref 0.5–1.9)
LACTIC ACID, VENOUS: 0.74 mmol/L (ref 0.5–1.9)

## 2016-02-18 LAB — POC URINE PREG, ED: Preg Test, Ur: NEGATIVE

## 2016-02-18 MED ORDER — MORPHINE SULFATE (PF) 4 MG/ML IV SOLN
4.0000 mg | Freq: Once | INTRAVENOUS | Status: AC
  Administered 2016-02-18: 4 mg via INTRAVENOUS
  Filled 2016-02-18: qty 1

## 2016-02-18 MED ORDER — TRAMADOL HCL 50 MG PO TABS: 50.0000 mg | ORAL_TABLET | Freq: Four times a day (QID) | ORAL | 0 refills | Status: DC | PRN

## 2016-02-18 MED ORDER — CYCLOBENZAPRINE HCL 10 MG PO TABS: 10.0000 mg | ORAL_TABLET | Freq: Two times a day (BID) | ORAL | 0 refills | Status: DC | PRN

## 2016-02-18 MED ORDER — HYDROCODONE-ACETAMINOPHEN 5-325 MG PO TABS
1.0000 | ORAL_TABLET | Freq: Once | ORAL | Status: AC
  Administered 2016-02-18: 1 via ORAL
  Filled 2016-02-18: qty 1

## 2016-02-18 MED ORDER — ONDANSETRON HCL 4 MG/2ML IJ SOLN
4.0000 mg | Freq: Once | INTRAMUSCULAR | Status: AC
  Administered 2016-02-18: 4 mg via INTRAVENOUS
  Filled 2016-02-18: qty 2

## 2016-02-18 MED ORDER — SODIUM CHLORIDE 0.9 % IV BOLUS (SEPSIS)
1000.0000 mL | Freq: Once | INTRAVENOUS | Status: AC
  Administered 2016-02-18: 1000 mL via INTRAVENOUS

## 2016-02-18 MED ORDER — ASPIRIN 81 MG PO CHEW: 81.0000 mg | CHEWABLE_TABLET | Freq: Every day | ORAL | 0 refills | Status: DC

## 2016-02-18 MED ORDER — HYDROMORPHONE HCL 1 MG/ML IJ SOLN
1.0000 mg | Freq: Once | INTRAMUSCULAR | Status: AC
  Administered 2016-02-18: 1 mg via INTRAVENOUS
  Filled 2016-02-18: qty 1

## 2016-02-18 MED ORDER — LORAZEPAM 2 MG/ML IJ SOLN
1.0000 mg | Freq: Once | INTRAMUSCULAR | Status: AC
  Administered 2016-02-18: 1 mg via INTRAVENOUS
  Filled 2016-02-18: qty 1

## 2016-02-18 MED ORDER — CEPHALEXIN 500 MG PO CAPS: 500.0000 mg | ORAL_CAPSULE | Freq: Three times a day (TID) | ORAL | 0 refills | Status: AC

## 2016-02-18 MED ORDER — GADOBENATE DIMEGLUMINE 529 MG/ML IV SOLN
20.0000 mL | Freq: Once | INTRAVENOUS | Status: AC | PRN
  Administered 2016-02-18: 20 mL via INTRAVENOUS

## 2016-02-18 MED ORDER — ACETAMINOPHEN 325 MG PO TABS
650.0000 mg | ORAL_TABLET | Freq: Once | ORAL | Status: AC
  Administered 2016-02-18: 650 mg via ORAL
  Filled 2016-02-18: qty 2

## 2016-02-18 NOTE — ED Notes (Signed)
Pt states she is unable to provide urine sample at this time.

## 2016-02-18 NOTE — ED Triage Notes (Signed)
Pt has had multiple back surgeries and feels her legs are becoming weak. Pt was walking down steps at apartment complex, legs gave way and pt fell down 12 steps on buttocks. Pt hit her head but did not lose consciousness. Pt is not on blood thinners. Pt complaining of pain everywhere. BP 109/72 pain 10/10. EMS reports pt is able to move all extremities, but has C collar on. Pt ambulatory for EMS. HR 121, 99% on room air

## 2016-02-18 NOTE — ED Notes (Signed)
Pt ambulated per RN request. Post ambulation pt stated that she was not allowing staff to place her back on the monitor. Pt stated " I want my discharge paperwork and I am going home".

## 2016-02-18 NOTE — ED Provider Notes (Signed)
[facility] DEPT Provider Note   CSN: [id number] Arrival date & time: 02/18/16  [id number]     History   Chief Complaint Chief Complaint  Patient presents with  . Fall    HPI Terri [name] is a 40 y.o. female.  HPI   Fall this AM and 3-4 days ago.  Left leg chronic numbness now feels like having numbness in both. Feels pain in both extremities, thinks weakness too. Pain as sharp/shooting/aching bilateral. No hx of bowel incontinence, urinary retention over months no new. No overflow incontinence.  Thoracic back pain is new since fall 3 days ago.  Lumbar back pain is present. Does have neck pain from fall today. Reports falling on bottom and sliding down 12 stairs on her back. Did not hit head. No LOC.  Began to feel feverish, sick contact with fiance with URI, feels like when she had pneumonia. Coughing very little, productive of mucus. Chest XR in winter and found nodules. No dizziness, no vomiting, no diarrhea, no LOC.  Right leg with pain which is new, right toe with tingling as well as tingling down bilateral lower extremities.   Past Medical History:  Diagnosis Date  . Bipolar disorder ([place])   . Chronic back pain   . Hypertension   . Migraine headache   . Sciatica   . Stress incontinence     Patient Active Problem List   Diagnosis Date Noted  . Anxiety 02/11/2014  . Depression 07/12/2012    Past Surgical History:  Procedure Laterality Date  . ANKLE SURGERY    . BACK SURGERY    . CESAREAN SECTION    . FRACTURE SURGERY    . GASTRIC BYPASS    . LUMBAR DISC SURGERY    . LUMBAR FUSION    . TUBAL LIGATION      OB History    No data available       Home Medications    Prior to Admission medications   Medication Sig Start Date End Date Taking? Authorizing Provider  acetaminophen (TYLENOL) 500 MG tablet Take 1,000 mg by mouth every 6 (six) hours as needed for moderate pain (pain).    Historical Provider, MD  albuterol (PROVENTIL HFA;VENTOLIN HFA) 108 (90 BASE)  MCG/ACT inhaler Inhale 1-2 puffs into the lungs every 4 (four) hours as needed for wheezing or shortness of breath. 03/07/15   Nicole Pisciotta, PA-C  amitriptyline (ELAVIL) 75 MG tablet Take 1 tablet (75 mg total) by mouth at bedtime. 02/11/14   Patrecia Pour, NP  amLODipine (NORVASC) 10 MG tablet Take 10 mg by mouth daily.    Historical Provider, MD  aspirin 81 MG chewable tablet Chew 1 tablet (81 mg total) by mouth daily. 02/18/16   Gareth Morgan, MD  benzonatate (TESSALON) 100 MG capsule Take 1 capsule (100 mg total) by mouth every 8 (eight) hours. 03/07/15   Nicole Pisciotta, PA-C  cephALEXin (KEFLEX) 500 MG capsule Take 1 capsule (500 mg total) by mouth 3 (three) times daily. 02/18/16 02/25/16  Gareth Morgan, MD  cyclobenzaprine (FLEXERIL) 10 MG tablet Take 1 tablet (10 mg total) by mouth 2 (two) times daily as needed for muscle spasms. 02/18/16   Gareth Morgan, MD  DULoxetine (CYMBALTA) 60 MG capsule Take 60 mg by mouth daily. 12/23/14   Historical Provider, MD  gabapentin (NEURONTIN) 300 MG capsule Take 1 capsule (300 mg total) by mouth 3 (three) times daily. 02/11/14   Patrecia Pour, NP  HYDROcodone-acetaminophen (NORCO) 7.5-325 MG per tablet Take  1 tablet by mouth 3 (three) times daily as needed (pain).  12/18/14   Historical Provider, MD  levofloxacin (LEVAQUIN) 750 MG tablet Take 1 tablet (750 mg total) by mouth daily. X 7 days 03/07/15   Elmyra Ricks Pisciotta, PA-C  predniSONE (DELTASONE) 50 MG tablet Take 1 tablet daily with breakfast 03/07/15   Monico Blitz, PA-C    Family History Family History  Problem Relation Age of Onset  . Diabetes Mother   . Hypertension Mother   . Diabetes Father   . Hypertension Father     Social History Social History  Substance Use Topics  . Smoking status: Current Every Day Smoker    Packs/day: 1.00    Types: Cigarettes  . Smokeless tobacco: Never Used  . Alcohol use No     Allergies   Ketorolac tromethamine and Methocarbamol   Review of  Systems Review of Systems  Constitutional: Positive for fever.  HENT: Negative for sore throat.   Eyes: Negative for visual disturbance.  Respiratory: Positive for cough (very mild, just started). Negative for shortness of breath.   Cardiovascular: Negative for chest pain.  Gastrointestinal: Negative for abdominal pain, constipation, diarrhea, nausea and vomiting.  Genitourinary: Negative for difficulty urinating.  Musculoskeletal: Positive for back pain. Negative for neck pain.  Skin: Negative for rash.  Neurological: Positive for weakness and numbness. Negative for syncope and headaches.     Physical Exam Updated Vital Signs BP 103/90   Pulse 113   Temp 100.4 F (38 C) (Oral)   Resp 23   Ht 5\' 9"  (1.753 m)   Wt 196 lb (88.9 kg)   LMP 01/19/2016   SpO2 100%   BMI 28.94 kg/m   Physical Exam  Constitutional: She is oriented to person, place, and time. She appears well-developed and well-nourished. No distress.  HENT:  Head: Normocephalic and atraumatic.  Eyes: Conjunctivae and EOM are normal.  Neck: Normal range of motion.  Cardiovascular: Normal rate, regular rhythm, normal heart sounds and intact distal pulses.  Exam reveals no gallop and no friction rub.   No murmur heard. Pulses:      Dorsalis pedis pulses are 0 on the right side, and 2+ on the left side.       Posterior tibial pulses are 2+ on the right side, and 2+ on the left side.  Pulmonary/Chest: Effort normal and breath sounds normal. No respiratory distress. She has no wheezes. She has no rales.  Abdominal: Soft. She exhibits no distension. There is no tenderness. There is no guarding.  Musculoskeletal: She exhibits no edema.       Cervical back: She exhibits tenderness and bony tenderness.       Thoracic back: She exhibits tenderness and bony tenderness.       Lumbar back: She exhibits tenderness and bony tenderness.  Neurological: She is alert and oriented to person, place, and time. She has normal strength.  A sensory deficit (reports decreased sensation on left foot all distributions, right toes) is present. GCS eye subscore is 4. GCS verbal subscore is 5. GCS motor subscore is 6.  Reflex Scores:      Patellar reflexes are 0 on the right side and 0 on the left side. Skin: Skin is warm and dry. No rash noted. She is not diaphoretic. No erythema.  Nursing note and vitals reviewed.  Right toes numb which is new  ED Treatments / Results  Labs (all labs ordered are listed, but only abnormal results are displayed) Labs Reviewed  CBC WITH DIFFERENTIAL/PLATELET - Abnormal; Notable for the following:       Result Value   WBC 12.3 (*)    Hemoglobin 9.5 (*)    HCT 29.2 (*)    MCV 67.9 (*)    MCH 22.1 (*)    RDW 19.0 (*)    Neutro Abs 11.2 (*)    Lymphs Abs 0.6 (*)    All other components within normal limits  COMPREHENSIVE METABOLIC PANEL - Abnormal; Notable for the following:    Sodium 134 (*)    Calcium 8.7 (*)    Total Protein 6.4 (*)    Albumin 3.0 (*)    ALT 10 (*)    All other components within normal limits  URINALYSIS, ROUTINE W REFLEX MICROSCOPIC (NOT AT [facility]) - Abnormal; Notable for the following:    APPearance CLOUDY (*)    Nitrite POSITIVE (*)    Leukocytes, UA TRACE (*)    All other components within normal limits  URINE MICROSCOPIC-ADD ON - Abnormal; Notable for the following:    Squamous Epithelial / LPF 0-5 (*)    Bacteria, UA MANY (*)    All other components within normal limits  URINE CULTURE  CULTURE, BLOOD (ROUTINE X 2)  CULTURE, BLOOD (ROUTINE X 2)  I-STAT CG4 LACTIC ACID, ED  I-STAT CG4 LACTIC ACID, ED  POC URINE PREG, ED    EKG  EKG Interpretation None       Radiology Dg Chest 2 View  Result Date: 02/18/2016 CLINICAL DATA:  Fall this morning down 12 stairs. Pain in upper back, denies LOC EXAM: CHEST  2 VIEW COMPARISON:  03/07/2015 FINDINGS: Normal mediastinum and cardiac silhouette. Normal pulmonary vasculature. No evidence of effusion, infiltrate, or  pneumothorax. No acute bony abnormality. IMPRESSION: No radiographic evidence of thoracic trauma. Electronically Signed   By: Suzy Bouchard M.D.   On: 02/18/2016 12:43   Mr Cervical Spine Wo Contrast  Result Date: 02/18/2016 CLINICAL DATA:  Fever, fall, rule out fracture or epidural abscess. Severe back pain EXAM: MRI CERVICAL SPINE WITHOUT CONTRAST TECHNIQUE: Multiplanar, multisequence MR imaging of the cervical spine was performed. No intravenous contrast was administered. COMPARISON:  Cervical spine radiographs 10/01/2013 FINDINGS: The patient was not able to hold still. Image quality is markedly degraded by motion Alignment: Normal alignment.  Mild cervical kyphosis. Vertebrae: Negative for fracture or mass.  No fluid collection. Cord: Spinal cord grossly normal but not well evaluated due to motion. Posterior Fossa, vertebral arteries, paraspinal tissues: Negative Disc levels: Mild cervical spine degenerative changes diffusely. No focal disc protrusion or spinal stenosis. Axial images markedly degraded by motion. IMPRESSION: Unfortunately, image quality is degraded by extensive motion. This study may need to be repeated in the future if the patient's symptoms persist Mild cervical spine degenerative change is identified without acute abnormality Electronically Signed   By: Franchot Gallo M.D.   On: 02/18/2016 15:53   Mr Thoracic Spine Wo Contrast  Result Date: 02/18/2016 CLINICAL DATA:  Fall.  Back pain and fever. EXAM: MRI THORACIC SPINE WITHOUT CONTRAST TECHNIQUE: Multiplanar, multisequence MR imaging of the thoracic spine was performed. No intravenous contrast was administered. COMPARISON:  Thoracic radiographs 08/31/2014 FINDINGS: Image quality degraded by moderate to severe motion. Axial images are only marginally diagnostic. Multiple repeat sequences were performed. Alignment:  Normal Vertebrae: Negative for fracture.  Normal bone marrow. Cord: Cord not well evaluated due to motion. No obvious  abnormality Paraspinal and other soft tissues: Negative Disc levels: Mild disc degeneration and spurring  on the right at T1-2, T2-3, T3-4. Moderate right-sided osteophyte at T4-5. Small right-sided osteophyte at T5-6. Moderate right-sided osteophyte at T6-7. Central and left-sided disc protrusion at T7-8. Moderately large central disc protrusion at T9-10 with cord flattening and moderate spinal stenosis. Negative T10-11 and T11-T12 disc spaces IMPRESSION: Image quality degraded by significant motion. Multilevel degenerative change as above. Central and left-sided disc protrusion at T7-8 Moderately large central disc protrusion T9-10 with cord flattening and spinal stenosis. Electronically Signed   By: Franchot Gallo M.D.   On: 02/18/2016 16:06   Mr Lumbar Spine Wo Contrast  Result Date: 02/18/2016 CLINICAL DATA:  Fall, back pain. Fever. Question fracture or epidural abscess. EXAM: MRI LUMBAR SPINE WITHOUT AND WITH CONTRAST TECHNIQUE: Multiplanar and multiecho pulse sequences of the lumbar spine were obtained without and with intravenous contrast. CONTRAST:  77mL MULTIHANCE GADOBENATE DIMEGLUMINE 529 MG/ML IV SOLN COMPARISON:  Lumbar radiographs 08/31/2014 FINDINGS: Image quality degraded by motion which became progressively worse throughout the scan. Segmentation:  Normal Alignment:  Normal Vertebrae:  Negative for fracture or mass lesion. Conus medullaris: Extends to the L1-2 level and appears normal. Paraspinal and other soft tissues: Negative Disc levels: L1-2: Negative L2-3:  Mild facet degeneration.  Normal disc space without stenosis L3-4: Extruded disc fragment in the midline. Disc fragment extends cranially in the midline without significant neural compression. Moderate facet degeneration. Bilateral laminectomy. No significant spinal stenosis. Disc bulging and spurring with mild subarticular stenosis bilaterally. L4-5: Moderate disc degeneration and spurring left greater than right. Decompressive  laminectomy without spinal stenosis. Mild facet degeneration. Subarticular stenosis the left could cause impingement of the left L5 nerve root. L5-S1: Posterior laminectomy without spinal stenosis. Disc degeneration and diffuse endplate spurring bilaterally. IMPRESSION: Laminectomy at L3-4 without significant spinal stenosis. Small extruded disc fragment in the midline extending cranially which could be a cause of back pain. Mild subarticular stenosis bilaterally Laminectomy L4-5 without spinal stenosis. Subarticular stenosis on the left could cause impingement of the left L5 nerve root Laminectomy L5-S1 without significant spinal stenosis. No evidence of epidural abscess or fracture. Image quality degraded by motion. Electronically Signed   By: Franchot Gallo M.D.   On: 02/18/2016 15:58    Procedures Procedures (including critical care time)  Medications Ordered in ED Medications  sodium chloride 0.9 % bolus 1,000 mL (0 mLs Intravenous Stopped 02/18/16 1757)  sodium chloride 0.9 % bolus 1,000 mL (0 mLs Intravenous Stopped 02/18/16 1758)  acetaminophen (TYLENOL) tablet 650 mg (650 mg Oral Given 02/18/16 1052)  morphine 4 MG/ML injection 4 mg (4 mg Intravenous Given 02/18/16 1053)  ondansetron (ZOFRAN) injection 4 mg (4 mg Intravenous Given 02/18/16 1053)  morphine 4 MG/ML injection 4 mg (4 mg Intravenous Given 02/18/16 1316)  LORazepam (ATIVAN) injection 1 mg (1 mg Intravenous Given 02/18/16 1347)  HYDROmorphone (DILAUDID) injection 1 mg (1 mg Intravenous Given 02/18/16 1345)  gadobenate dimeglumine (MULTIHANCE) injection 20 mL (20 mLs Intravenous Contrast Given 02/18/16 1535)  HYDROcodone-acetaminophen (NORCO/VICODIN) 5-325 MG per tablet 1 tablet (1 tablet Oral Given 02/18/16 1725)     Initial Impression / Assessment and Plan / ED Course  I have reviewed the triage vital signs and the nursing notes.  Pertinent labs & imaging results that were available during my care of the patient were reviewed by me  and considered in my medical decision making (see chart for details).  Clinical Course   40yo female with history of bipolar disorder, htn, chronic back pain, presents with concern for back pain,  fall, bilateral leg pain and weakness, and fever on arrival to ED.   Regarding fall, pt denies head trauma, LOC, is not on anticoagulation, is can[place] head CT neg and doubt intracranial injury.  Pt with midline CSpine, TSpine and Lspine tenderness.   Regarding fever, pt reports she is maybe beginning to get a cough, however given pt with back pain as chief concern and fever, ordered MR to evaluate for epidural abscess/osteomyelitis. Given pt tachycardic,fever, unclear source, drew blood cx, lactic acid, gave fluids. Lactic acid WNL.  HR improved.  Back pain, pt reports bilateral leg weakness however has full strength on my exam.  However, given her concerns, as well as fever/back pain and areas of tenderness ordered MR of C/T/L spine to evaluate for both trauma and infection.  MR shows no infection or fx. Herniated disks present. Pt without signs of cord compression on my exam. Discussed with Dr. Saintclair Halsted [facility] and will arrange outpt follow up.   Patient also noted to not have palpable right DP pulse. I am able to obtain signal by doppler. Palpable PT pulse is present, her foot is well perfused, and doubt acute arterial thrombosis or claudidcation as etiology of her symptoms.  However, pt not aware of pulse deficit and given she does have new right sided toe tingling, pain in her right leg with ambulation, will initiate aspirin. Discussed with Dr. Donnetta Hutching of vascular surgery and will arrange outpatient follow up. Discussed importance of smoking cessation.  Urinalysis returned with signs of UTI.  Will treat with keflex. Suspect UTI, poss early URI, muscular pain, from fall, disk herniation, and exacerbation of chronic back pain.  Given rx for flexeril for pain.  Pt to follow up with [facility] regarding disc herniation/back  pain, vascular surgery for pulse differential, and PCP.  Final Clinical Impressions(s) / ED Diagnoses   Final diagnoses:  Fall  Back pain  URI (upper respiratory infection)  Fever, unspecified fever cause  Decreased dorsalis pedis pulse  Urinary tract infection without hematuria, site unspecified    New Prescriptions Discharge Medication List as of 02/18/2016  5:58 PM    START taking these medications   Details  aspirin 81 MG chewable tablet Chew 1 tablet (81 mg total) by mouth daily., Starting Wed 02/18/2016, Print    cephALEXin (KEFLEX) 500 MG capsule Take 1 capsule (500 mg total) by mouth 3 (three) times daily., Starting Wed 02/18/2016, Until Wed 02/25/2016, Print    cyclobenzaprine (FLEXERIL) 10 MG tablet Take 1 tablet (10 mg total) by mouth 2 (two) times daily as needed for muscle spasms., Starting Wed 02/18/2016, Print         Gareth Morgan, MD 02/19/16 1130

## 2016-02-18 NOTE — ED Notes (Signed)
Pt given a warm blanket at request.

## 2016-02-18 NOTE — ED Notes (Signed)
Spoke with EDP regarding patient's concern of MRI and being comfortable. Ordered medications for MRI.

## 2016-02-18 NOTE — ED Notes (Signed)
Called MRI when patient will be transported. There are other patient's currently having MRI's at this time.

## 2016-02-19 ENCOUNTER — Telehealth: Payer: Self-pay | Admitting: Vascular Surgery

## 2016-02-19 ENCOUNTER — Other Ambulatory Visit: Payer: Self-pay | Admitting: *Deleted

## 2016-02-19 DIAGNOSIS — I739 Peripheral vascular disease, unspecified: Secondary | ICD-10-CM

## 2016-02-19 NOTE — Telephone Encounter (Signed)
Pt was called an VM left on home #, new pt letter mailed as well for appt. Will be seeing Dr Donzetta Matters 10/5 w/ABI

## 2016-02-19 NOTE — Telephone Encounter (Signed)
-----   Message from Mena Goes, RN sent at 02/19/2016 10:13 AM EDT -----   ----- Message ----- From: Rosetta Posner, MD Sent: 02/18/2016   5:41 PM To: [facility] Charge Pool  Called from ER MD.  Right leg pain with palp PT pulse.  Needs ov and ABI in next 1-2 weeks

## 2016-02-20 LAB — URINE CULTURE

## 2016-02-21 ENCOUNTER — Telehealth (HOSPITAL_BASED_OUTPATIENT_CLINIC_OR_DEPARTMENT_OTHER): Payer: Self-pay

## 2016-02-21 NOTE — Telephone Encounter (Signed)
Post ED Visit - Positive Culture Follow-up  Culture report reviewed by antimicrobial stewardship pharmacist:  []  Elenor Quinones, Pharm.D. []  Heide Guile, Pharm.D., BCPS []  Parks Neptune, Pharm.D. []  Alycia Rossetti, Pharm.D., BCPS []  [place], Pharm.D., BCPS, AAHIVP []  Legrand Como, Pharm.D., BCPS, AAHIVP []  Milus Glazier, Pharm.D. []  Rob Evette Doffing, Pharm.D. Dimitri Ped PHarm D Positive urine culture and no further patient follow-up is required at this time.  Genia Del 02/21/2016, 9:03 AM

## 2016-02-23 LAB — CULTURE, BLOOD (ROUTINE X 2)
CULTURE: NO GROWTH
CULTURE: NO GROWTH

## 2016-02-24 ENCOUNTER — Encounter: Payer: Self-pay | Admitting: Vascular Surgery

## 2016-02-27 ENCOUNTER — Encounter: Payer: Self-pay | Admitting: Vascular Surgery

## 2016-02-27 ENCOUNTER — Inpatient Hospital Stay (HOSPITAL_COMMUNITY): Admission: RE | Admit: 2016-02-27 | Payer: Self-pay | Source: Ambulatory Visit

## 2016-04-19 ENCOUNTER — Other Ambulatory Visit: Payer: Self-pay | Admitting: Internal Medicine

## 2016-04-19 DIAGNOSIS — Z1231 Encounter for screening mammogram for malignant neoplasm of breast: Secondary | ICD-10-CM

## 2016-04-26 ENCOUNTER — Encounter: Payer: Self-pay | Admitting: Vascular Surgery

## 2016-04-30 ENCOUNTER — Encounter (HOSPITAL_COMMUNITY): Payer: Self-pay

## 2016-04-30 ENCOUNTER — Encounter: Payer: Self-pay | Admitting: Vascular Surgery

## 2016-05-12 ENCOUNTER — Ambulatory Visit: Payer: Self-pay

## 2016-06-11 ENCOUNTER — Ambulatory Visit: Payer: Self-pay

## 2016-06-29 ENCOUNTER — Ambulatory Visit: Payer: Self-pay

## 2016-07-20 ENCOUNTER — Ambulatory Visit: Payer: Self-pay

## 2016-07-23 ENCOUNTER — Other Ambulatory Visit: Payer: Self-pay | Admitting: Internal Medicine

## 2016-07-23 DIAGNOSIS — K219 Gastro-esophageal reflux disease without esophagitis: Secondary | ICD-10-CM

## 2016-08-04 ENCOUNTER — Other Ambulatory Visit: Payer: Self-pay

## 2016-08-06 ENCOUNTER — Ambulatory Visit: Payer: Self-pay

## 2016-08-27 ENCOUNTER — Other Ambulatory Visit: Payer: Self-pay

## 2016-08-27 ENCOUNTER — Ambulatory Visit: Payer: Self-pay

## 2016-09-09 ENCOUNTER — Ambulatory Visit
Admission: RE | Admit: 2016-09-09 | Discharge: 2016-09-09 | Disposition: A | Discharge status: Home / Self Care | Payer: Medicare Other | Source: Ambulatory Visit | Attending: Internal Medicine | Admitting: Internal Medicine

## 2016-09-09 DIAGNOSIS — K219 Gastro-esophageal reflux disease without esophagitis: Secondary | ICD-10-CM

## 2016-09-15 ENCOUNTER — Ambulatory Visit: Payer: Self-pay | Admitting: Obstetrics

## 2016-09-22 ENCOUNTER — Ambulatory Visit: Payer: Self-pay

## 2016-09-30 IMAGING — MR MR THORACIC SPINE W/O CM
9 of 20 series · 22 of 48 positions shown · non-contrast
Comparison: Thoracic radiographs 08/31/2014

CLINICAL DATA: Fall.  Back pain and fever.

EXAM:
MRI THORACIC SPINE WITHOUT CONTRAST
TECHNIQUE: Multiplanar, multisequence MR imaging of the thoracic spine was
performed. No intravenous contrast was administered.

[Series 3: T2 · sagittal · 4.0mm · 0.59mm/px · 2 of 13 slices shown (1 of 7)]
[im 1/13]
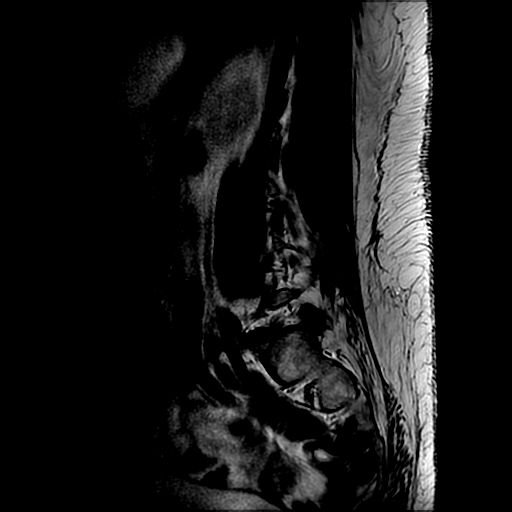
[im 13/13]
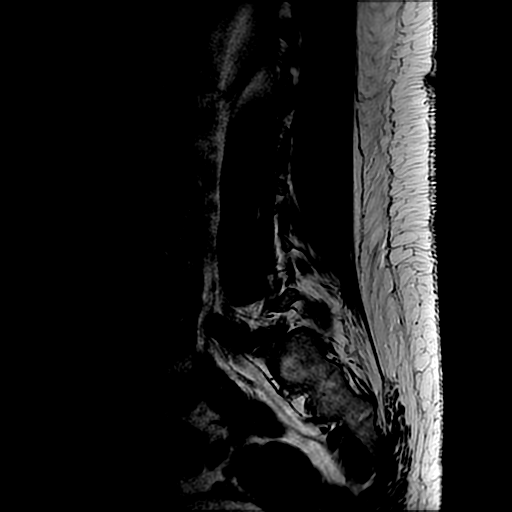

[Series 4: T1 · sagittal · 4.0mm · 0.59mm/px · 2 of 13 slices shown (1 of 2)]
[im 1/13]
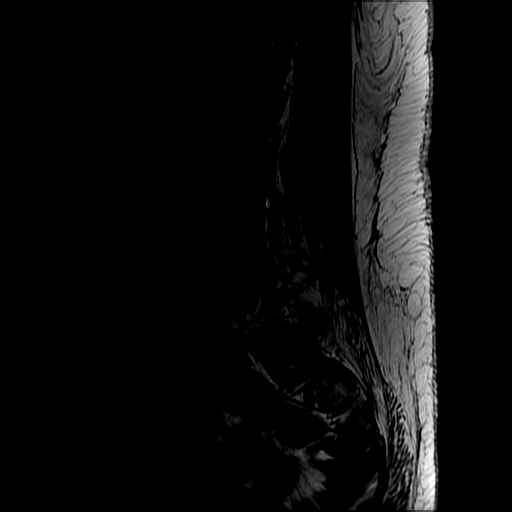
[im 13/13]
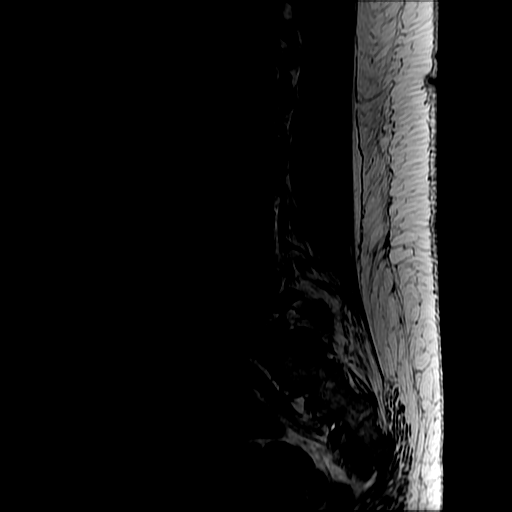

[Series 6: T2 · axial · 4.0mm · 0.39mm/px · z∈[-104,+129]mm · 6 of 42 slices shown (2 of 7)]
[im 1/42]
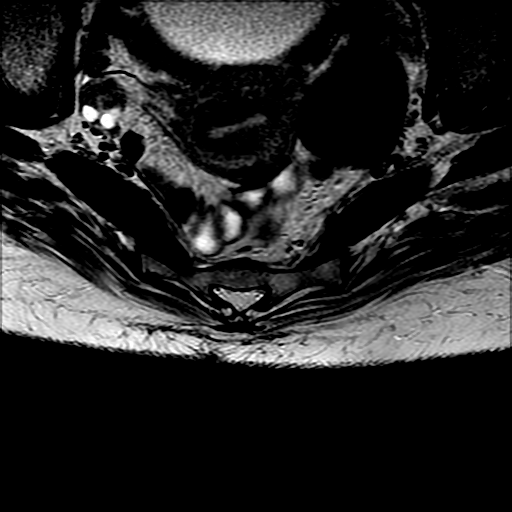
[im 9/42]
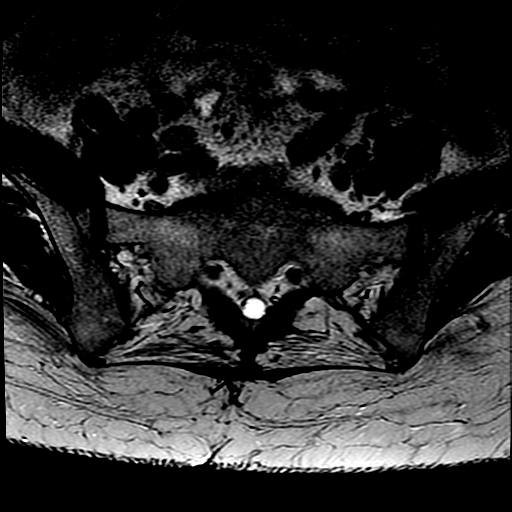
[im 17/42]
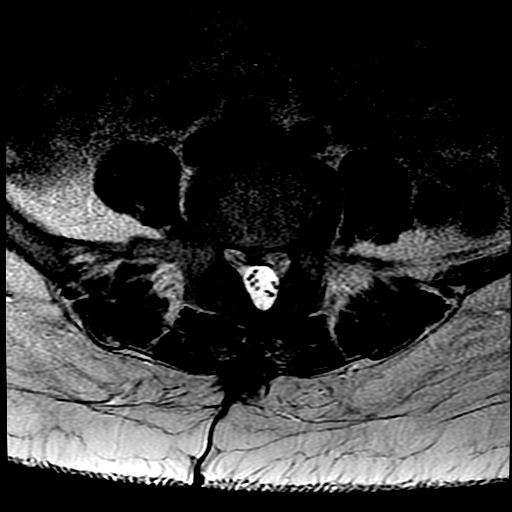
[im 25/42]
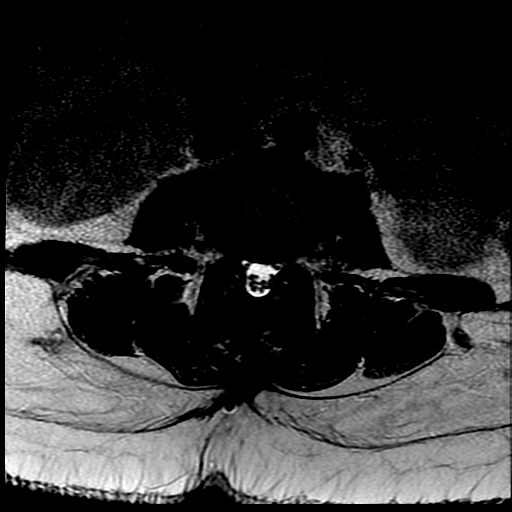
[im 33/42]
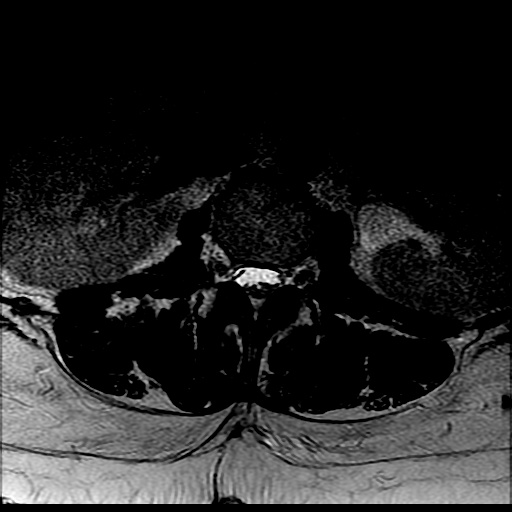
[im 42/42]
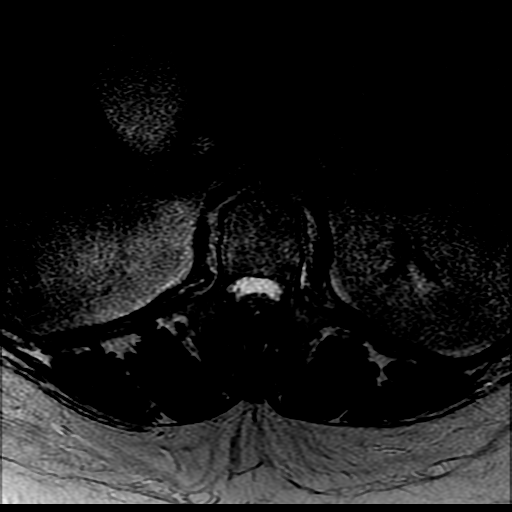

[Series 7: T1 · axial · 4.0mm · 0.39mm/px · z∈[-104,-37]mm · 2 of 42 slices shown (2 of 2)]
[im 1/42]
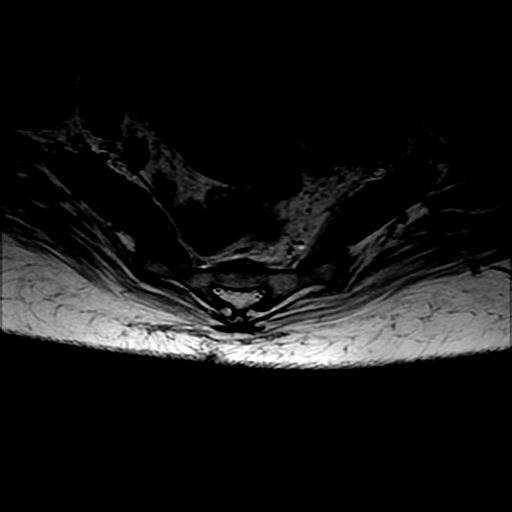
[im 11/42]
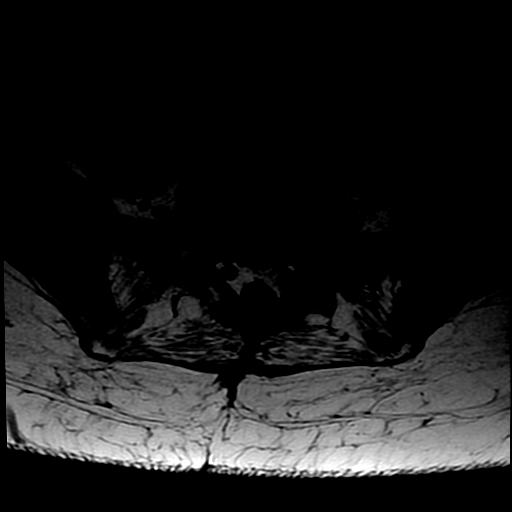

[Series 12: T2 · sagittal · 3.0mm · 0.62mm/px · 1 of 13 slices shown (3 of 7)]
[im 1/13]
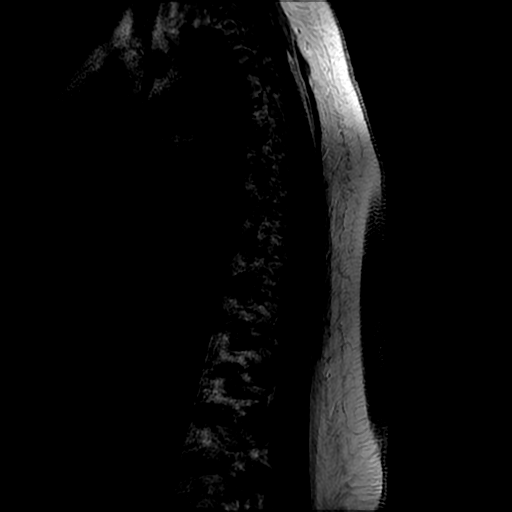

[Series 13: T2 · sagittal · 3.0mm · 0.62mm/px · 1 of 13 slices shown (4 of 7)]
[im 1/13]
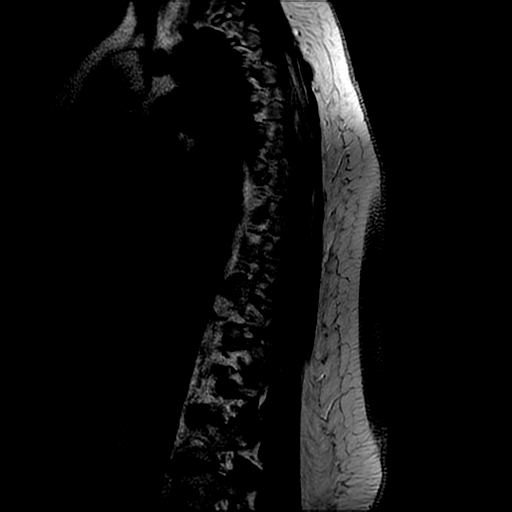

[Series 16: T2 · axial · 4.0mm · 0.86mm/px · z∈[+162,+418]mm · 4 of 36 slices shown (5 of 7)]
[im 1/36]
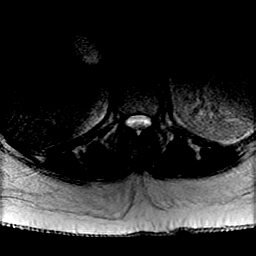
[im 12/36]
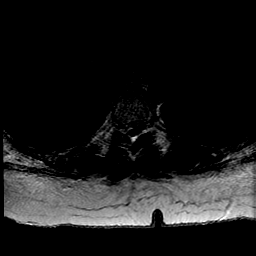
[im 24/36]
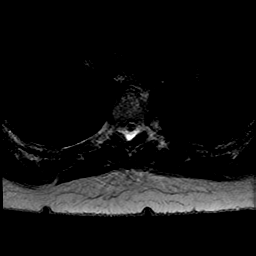
[im 36/36]
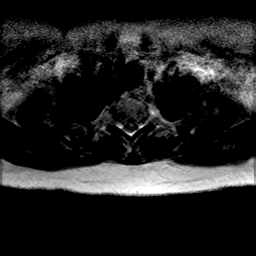

[Series 20: T2 · sagittal · 3.0mm · 0.43mm/px · 1 of 13 slices shown (6 of 7)]
[im 1/13]
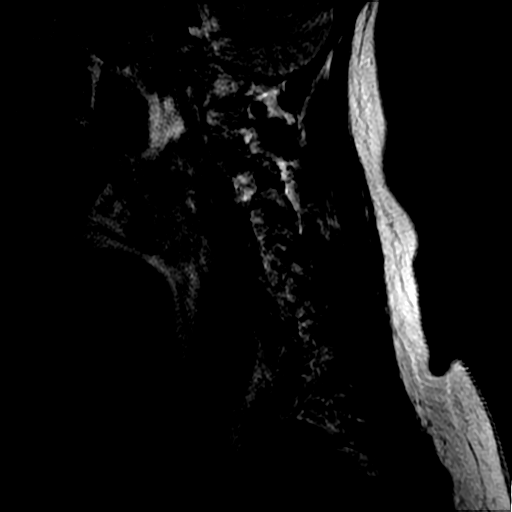

[Series 23: T2 · axial · 3.0mm · 0.39mm/px · z∈[-86,+13]mm · 3 of 31 slices shown (7 of 7)]
[im 1/31]
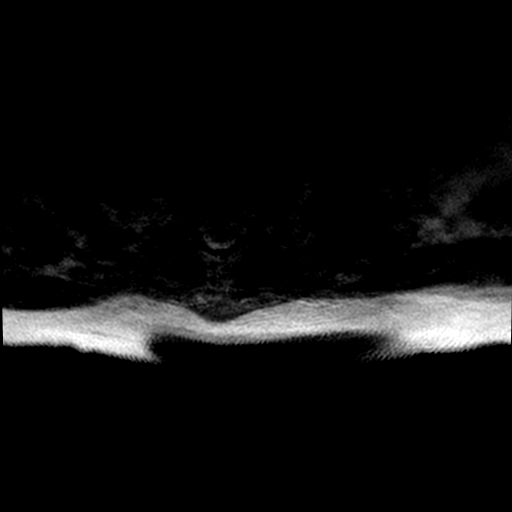
[im 16/31]
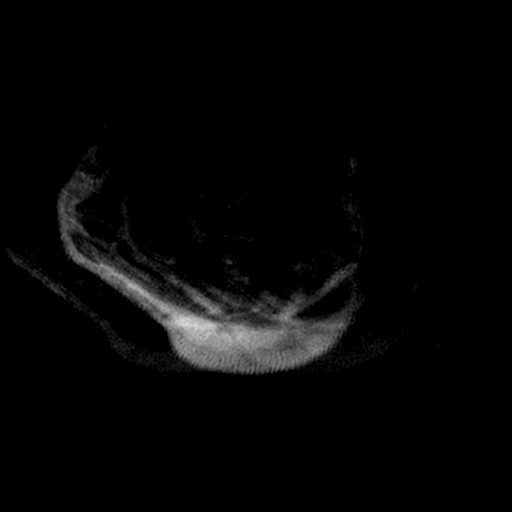
[im 31/31]
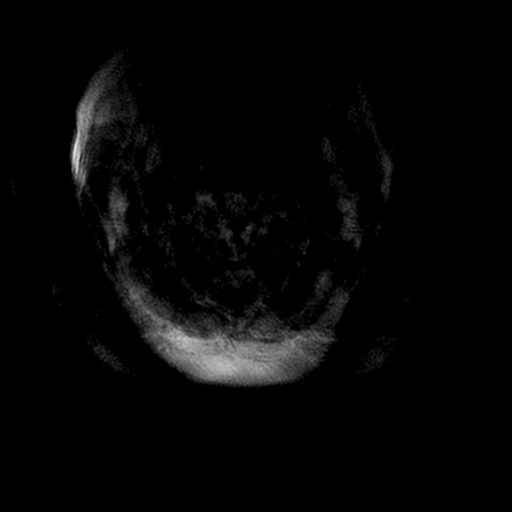

[22 of 48 positions shown; findings below may reference images not displayed]

FINDINGS: Image quality degraded by moderate to severe motion. Axial images
are only marginally diagnostic. Multiple repeat sequences were
performed.

Alignment:  Normal

Vertebrae: Negative for fracture.  Normal bone marrow.

Cord: Cord not well evaluated due to motion. No obvious abnormality

Paraspinal and other soft tissues: Negative

Disc levels:

Mild disc degeneration and spurring on the right at T1-2, T2-3,
T3-4. Moderate right-sided osteophyte at T4-5. Small right-sided
osteophyte at T5-6. Moderate right-sided osteophyte at T6-7.

Central and left-sided disc protrusion at T7-8.

Moderately large central disc protrusion at T9-10 with cord
flattening and moderate spinal stenosis.

Negative T10-11 and T11-T12 disc spaces
IMPRESSION: Image quality degraded by significant motion.

Multilevel degenerative change as above. Central and left-sided disc
protrusion at T7-8

Moderately large central disc protrusion T9-10 with cord flattening
and spinal stenosis.

## 2016-09-30 IMAGING — DX DG CHEST 2V
2 series · 2 of 2 positions shown · non-contrast
Comparison: 03/07/2015

CLINICAL DATA: Fall this morning down 12 stairs. Pain in upper
back, denies LOC

EXAM:
CHEST  2 VIEW

[chest lat]
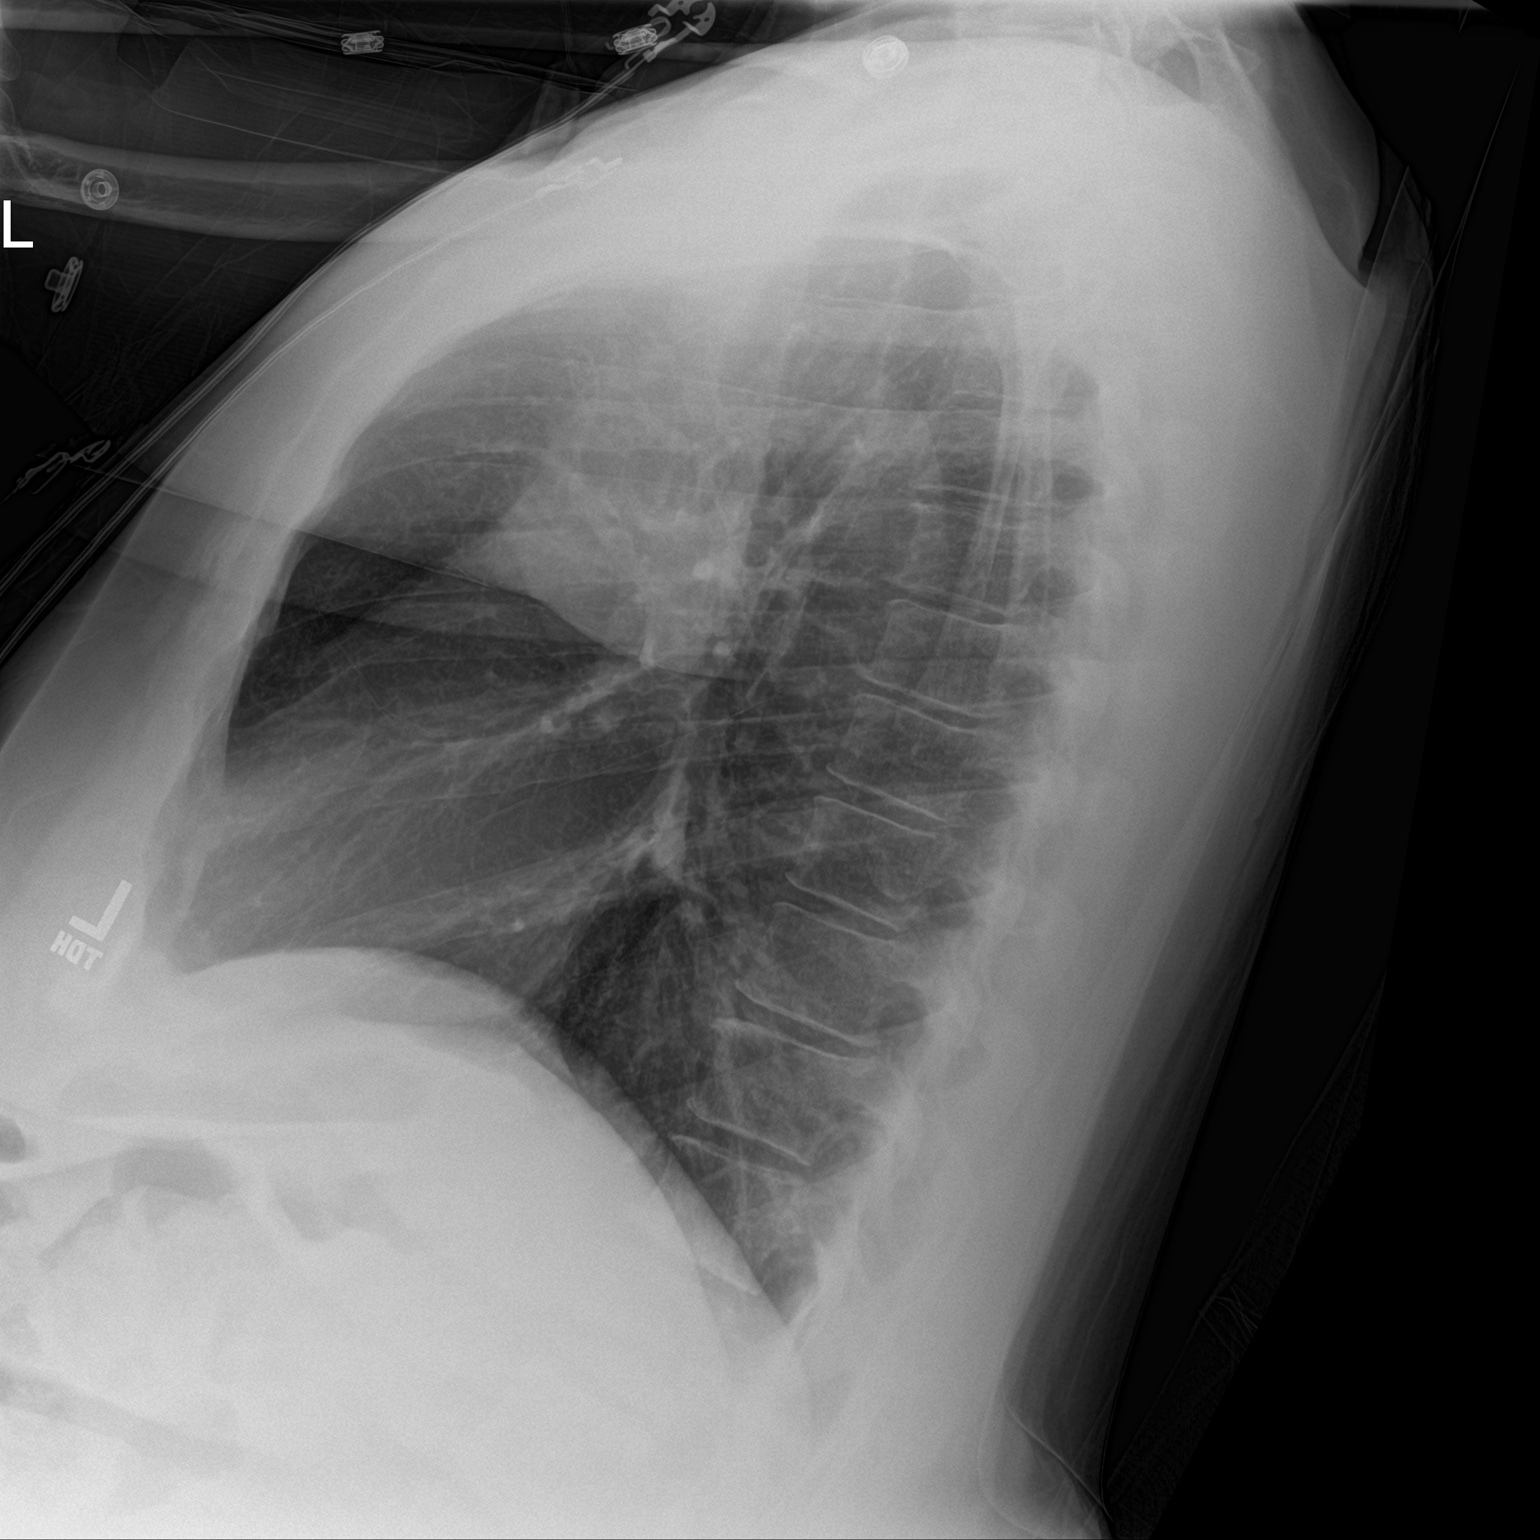

[chest ap]
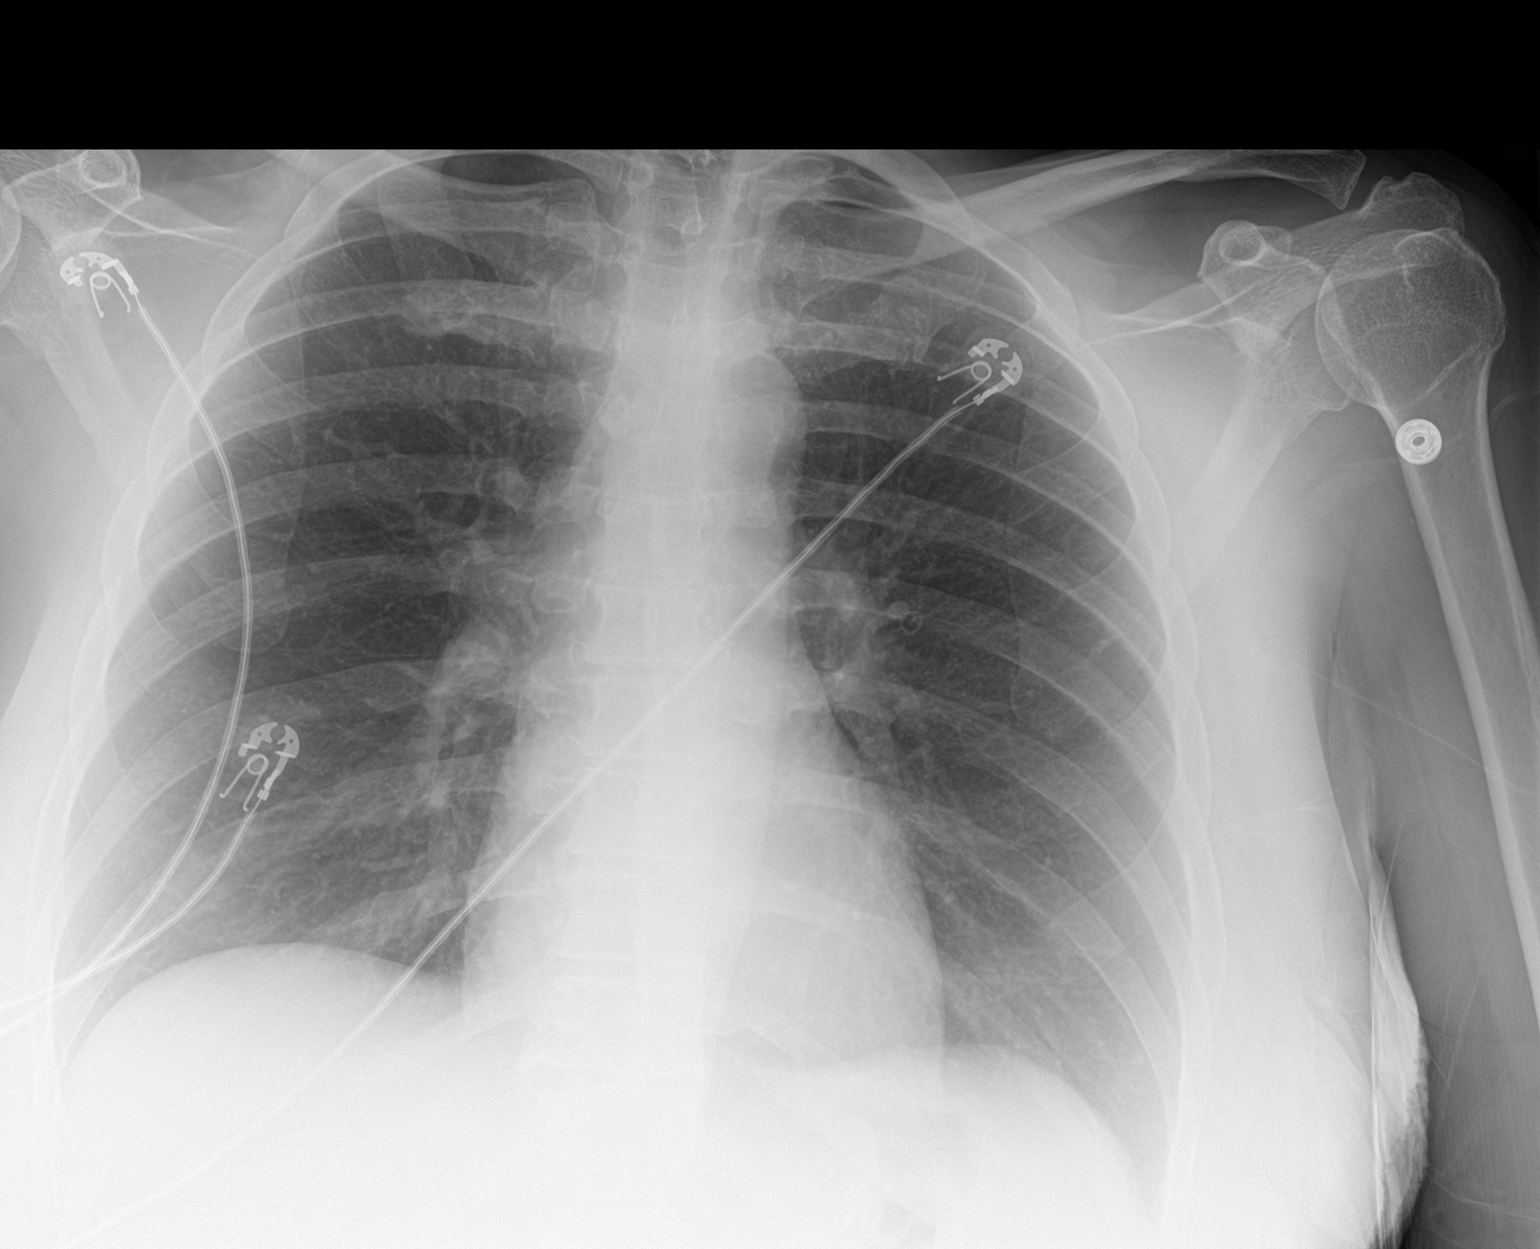

[2 of 2 positions shown; findings below may reference images not displayed]

FINDINGS: Normal mediastinum and cardiac silhouette. Normal pulmonary
vasculature. No evidence of effusion, infiltrate, or pneumothorax.
No acute bony abnormality.
IMPRESSION: No radiographic evidence of thoracic trauma.

## 2017-01-24 ENCOUNTER — Encounter (HOSPITAL_COMMUNITY): Payer: Self-pay

## 2017-01-24 ENCOUNTER — Other Ambulatory Visit: Payer: Self-pay

## 2017-01-24 ENCOUNTER — Emergency Department (HOSPITAL_COMMUNITY)
Admission: EM | Admit: 2017-01-24 | Discharge: 2017-01-26 | Disposition: A | Discharge status: Home / Self Care | Payer: Medicare Other | Attending: Emergency Medicine | Admitting: Emergency Medicine

## 2017-01-24 DIAGNOSIS — Z79899 Other long term (current) drug therapy: Secondary | ICD-10-CM | POA: Diagnosis not present

## 2017-01-24 DIAGNOSIS — T1491XA Suicide attempt, initial encounter: Secondary | ICD-10-CM | POA: Diagnosis not present

## 2017-01-24 DIAGNOSIS — F17210 Nicotine dependence, cigarettes, uncomplicated: Secondary | ICD-10-CM | POA: Diagnosis not present

## 2017-01-24 DIAGNOSIS — F329 Major depressive disorder, single episode, unspecified: Secondary | ICD-10-CM | POA: Diagnosis present

## 2017-01-24 DIAGNOSIS — Z7982 Long term (current) use of aspirin: Secondary | ICD-10-CM | POA: Diagnosis not present

## 2017-01-24 DIAGNOSIS — F1910 Other psychoactive substance abuse, uncomplicated: Secondary | ICD-10-CM | POA: Diagnosis not present

## 2017-01-24 DIAGNOSIS — Z046 Encounter for general psychiatric examination, requested by authority: Secondary | ICD-10-CM | POA: Diagnosis not present

## 2017-01-24 DIAGNOSIS — I10 Essential (primary) hypertension: Secondary | ICD-10-CM | POA: Insufficient documentation

## 2017-01-24 DIAGNOSIS — F1114 Opioid abuse with opioid-induced mood disorder: Secondary | ICD-10-CM | POA: Diagnosis present

## 2017-01-24 DIAGNOSIS — T401X2A Poisoning by heroin, intentional self-harm, initial encounter: Secondary | ICD-10-CM | POA: Diagnosis not present

## 2017-01-24 DIAGNOSIS — D649 Anemia, unspecified: Secondary | ICD-10-CM | POA: Insufficient documentation

## 2017-01-24 DIAGNOSIS — F32A Depression, unspecified: Secondary | ICD-10-CM | POA: Diagnosis present

## 2017-01-24 DIAGNOSIS — F419 Anxiety disorder, unspecified: Secondary | ICD-10-CM | POA: Diagnosis not present

## 2017-01-24 LAB — COMPREHENSIVE METABOLIC PANEL
ALT: 27 U/L (ref 14–54)
AST: 64 U/L — ABNORMAL HIGH (ref 15–41)
Albumin: 3.7 g/dL (ref 3.5–5.0)
Alkaline Phosphatase: 86 U/L (ref 38–126)
Anion gap: 8 (ref 5–15)
BILIRUBIN TOTAL: 0.8 mg/dL (ref 0.3–1.2)
BUN: 8 mg/dL (ref 6–20)
CHLORIDE: 104 mmol/L (ref 101–111)
CO2: 25 mmol/L (ref 22–32)
Calcium: 8.9 mg/dL (ref 8.9–10.3)
Creatinine, Ser: 0.66 mg/dL (ref 0.44–1.00)
GFR calc Af Amer: >60 mL/min (ref >60)
GFR calc non Af Amer: >60 mL/min (ref >60)
GLUCOSE: 105 mg/dL — AB (ref 65–99)
POTASSIUM: 3.1 mmol/L — AB (ref 3.5–5.1)
SODIUM: 137 mmol/L (ref 135–145)
Total Protein: 6.5 g/dL (ref 6.5–8.1)

## 2017-01-24 LAB — CBC
HEMATOCRIT: 25.8 % — AB (ref 36.0–46.0)
HEMOGLOBIN: 8.6 g/dL — AB (ref 12.0–15.0)
MCH: 21.9 pg — ABNORMAL LOW (ref 26.0–34.0)
MCHC: 33.3 g/dL (ref 30.0–36.0)
MCV: 65.8 fL — ABNORMAL LOW (ref 78.0–100.0)
Platelets: 250 10*3/uL (ref 150–400)
RBC: 3.92 MIL/uL (ref 3.87–5.11)
RDW: 19.5 % — ABNORMAL HIGH (ref 11.5–15.5)
WBC: 9.6 10*3/uL (ref 4.0–10.5)

## 2017-01-24 LAB — ACETAMINOPHEN LEVEL: Acetaminophen (Tylenol), Serum: <10 ug/mL — ABNORMAL LOW (ref 10–30)

## 2017-01-24 LAB — RAPID URINE DRUG SCREEN, HOSP PERFORMED
AMPHETAMINES: NOT DETECTED
BARBITURATES: NOT DETECTED
BENZODIAZEPINES: NOT DETECTED
Cocaine: NOT DETECTED
Opiates: POSITIVE — AB
Tetrahydrocannabinol: NOT DETECTED

## 2017-01-24 LAB — SALICYLATE LEVEL: Salicylate Lvl: <7 mg/dL (ref 2.8–30.0)

## 2017-01-24 LAB — I-STAT BETA HCG BLOOD, ED (MC, WL, AP ONLY): I-stat hCG, quantitative: <5 m[IU]/mL (ref <5)

## 2017-01-24 LAB — ETHANOL: Alcohol, Ethyl (B): <5 mg/dL (ref <5)

## 2017-01-24 MED ORDER — ZOLPIDEM TARTRATE 5 MG PO TABS
5.0000 mg | ORAL_TABLET | Freq: Every evening | ORAL | Status: DC | PRN
  Administered 2017-01-24: 5 mg via ORAL
  Filled 2017-01-24: qty 1

## 2017-01-24 MED ORDER — FERROUS SULFATE 325 (65 FE) MG PO TABS: 325.0000 mg | ORAL_TABLET | Freq: Every day | ORAL | 2 refills | Status: DC

## 2017-01-24 NOTE — ED Notes (Signed)
Report called to Erlene Quan, RN SAPPU.

## 2017-01-24 NOTE — ED Triage Notes (Signed)
Patient brought in by [employer]. Patient IVC'd by patient's sister. Per IVC paperwork, Patient has history of "overdosing" on heroin, and cutting her wrists. Per [employer] report, patient used heroin this afternoon and became agitated, causing the patient's family to call EMS. EMS administered narcan, per protocol. Per [employer] report, patient never lost consciousness or lost control of her airway.

## 2017-01-24 NOTE — ED Notes (Signed)
Bed: RESB Expected date:  Expected time:  Means of arrival:  Comments: [employer] Overdose

## 2017-01-24 NOTE — ED Provider Notes (Signed)
[facility] DEPT Provider Note   CSN: [id number] Arrival date & time: 01/24/17  1940     History   Chief Complaint Chief Complaint  Patient presents with  . Other    IVC    HPI Terri [name] is a 41 y.o. female.  Patient is a 41 year old female with a history of bipolar disease, anxiety, depression and substance abuse presenting today under IVC. Patient states that she is a heroin user and has been using more frequently. She states she has used 15 on the last 30 days but usually just once a day. Patient states she did use heroin today but it was not to hurt herself. She denies any suicidal thoughts or plans at this time. She states in the past she has thought about dying but is not been recently. Her IVC paperwork states that she has been cutting her wrists however she denies this. Patient states that her family member does not like her wants to take her daughter away and that is why she took the IVC paperwork out. Patient states after using the [place] when they got into a big argument which made her agitated and she states her family was freaking out. When EMS arrived they stated she was agitated and anxious so they gave her Narcan however at no time was she unconscious or having any difficulty protecting her airway. Patient denies alcohol or other drug use. She is still taking her prescribed medications. She denies chest pain, shortness of breath, diarrhea, abdominal pain or other acute symptoms.   The history is provided by the patient.    Past Medical History:  Diagnosis Date  . Bipolar disorder ([place])   . Chronic back pain   . Hypertension   . Migraine headache   . Sciatica   . Stress incontinence     Patient Active Problem List   Diagnosis Date Noted  . Anxiety 02/11/2014  . Depression 07/12/2012    Past Surgical History:  Procedure Laterality Date  . ANKLE SURGERY    . BACK SURGERY    . CESAREAN SECTION    . FRACTURE SURGERY    . GASTRIC BYPASS    . LUMBAR DISC  SURGERY    . LUMBAR FUSION    . TUBAL LIGATION      OB History    No data available       Home Medications    Prior to Admission medications   Medication Sig Start Date End Date Taking? Authorizing Provider  acetaminophen (TYLENOL) 500 MG tablet Take 1,000 mg by mouth every 6 (six) hours as needed for moderate pain (pain).    [provider]  albuterol (PROVENTIL HFA;VENTOLIN HFA) 108 (90 BASE) MCG/ACT inhaler Inhale 1-2 puffs into the lungs every 4 (four) hours as needed for wheezing or shortness of breath. 03/07/15   Pisciotta, Elmyra Ricks, PA-C  amitriptyline (ELAVIL) 75 MG tablet Take 1 tablet (75 mg total) by mouth at bedtime. 02/11/14   Patrecia Pour, NP  amLODipine (NORVASC) 10 MG tablet Take 10 mg by mouth daily.    [provider]  aspirin 81 MG chewable tablet Chew 1 tablet (81 mg total) by mouth daily. 02/18/16   Gareth Morgan, MD  benzonatate (TESSALON) 100 MG capsule Take 1 capsule (100 mg total) by mouth every 8 (eight) hours. 03/07/15   Pisciotta, Elmyra Ricks, PA-C  cyclobenzaprine (FLEXERIL) 10 MG tablet Take 1 tablet (10 mg total) by mouth 2 (two) times daily as needed for muscle spasms. 02/18/16  Gareth Morgan, MD  DULoxetine (CYMBALTA) 60 MG capsule Take 60 mg by mouth daily. 12/23/14   [provider]  gabapentin (NEURONTIN) 300 MG capsule Take 1 capsule (300 mg total) by mouth 3 (three) times daily. 02/11/14   Patrecia Pour, NP  HYDROcodone-acetaminophen (NORCO) 7.5-325 MG per tablet Take 1 tablet by mouth 3 (three) times daily as needed (pain).  12/18/14   [provider]  levofloxacin (LEVAQUIN) 750 MG tablet Take 1 tablet (750 mg total) by mouth daily. X 7 days 03/07/15   Pisciotta, Elmyra Ricks, PA-C  predniSONE (DELTASONE) 50 MG tablet Take 1 tablet daily with breakfast 03/07/15   Pisciotta, Elmyra Ricks, PA-C    Family History Family History  Problem Relation Age of Onset  . Diabetes Mother   . Hypertension Mother   . Diabetes Father   .  Hypertension Father     Social History Social History  Substance Use Topics  . Smoking status: Current Every Day Smoker    Packs/day: 1.00    Types: Cigarettes  . Smokeless tobacco: Never Used  . Alcohol use No     Allergies   Ketorolac tromethamine and Methocarbamol   Review of Systems Review of Systems  All other systems reviewed and are negative.    Physical Exam Updated Vital Signs BP (!) 147/107 (BP Location: Left Arm)   Pulse 97   Temp 98.2 F (36.8 C) (Oral)   Resp 20   Wt 91.6 kg (202 lb)   LMP 01/24/2017   SpO2 99%   BMI 29.83 kg/m   Physical Exam  Constitutional: She is oriented to person, place, and time. She appears well-developed and well-nourished. No distress.  HENT:  Head: Normocephalic and atraumatic.  Mouth/Throat: Oropharynx is clear and moist.  Eyes: Pupils are equal, round, and reactive to light. Conjunctivae and EOM are normal.  Neck: Normal range of motion. Neck supple.  Cardiovascular: Normal rate, regular rhythm and intact distal pulses.   No murmur heard. Pulmonary/Chest: Effort normal and breath sounds normal. No respiratory distress. She has no wheezes. She has no rales.  Abdominal: Soft. She exhibits no distension. There is no tenderness. There is no rebound and no guarding.  Musculoskeletal: Normal range of motion. She exhibits no edema or tenderness.  No evidence of cutting on her wrists  Neurological: She is alert and oriented to person, place, and time.  Skin: Skin is warm and dry. No rash noted. No erythema.  Patient has small wounds over the face and extremities consistent with picking  Psychiatric: She has a normal mood and affect. Her behavior is normal. She expresses no suicidal ideation. She expresses no suicidal plans and no homicidal plans.  Nursing note and vitals reviewed.    ED Treatments / Results  Labs (all labs ordered are listed, but only abnormal results are displayed) Labs Reviewed  CBC - Abnormal; Notable  for the following:       Result Value   Hemoglobin 8.6 (*)    HCT 25.8 (*)    MCV 65.8 (*)    MCH 21.9 (*)    RDW 19.5 (*)    All other components within normal limits  COMPREHENSIVE METABOLIC PANEL  ETHANOL  SALICYLATE LEVEL  ACETAMINOPHEN LEVEL  RAPID URINE DRUG SCREEN, HOSP PERFORMED  I-STAT BETA HCG BLOOD, ED ([facility], [facility], [facility] ONLY)    EKG  EKG Interpretation  Date/Time:  Monday January 24 2017 98:92:11 EDT Ventricular Rate:  85 PR Interval:    QRS Duration: 97 QT  Interval:  382 QTC Calculation: 455 R Axis:   52 Text Interpretation:  Sinus rhythm Prominent P waves, nondiagnostic No significant change since last tracing Confirmed by Blanchie Dessert [phone]) on 01/24/2017 8:14:34 PM       Radiology No results found.  Procedures Procedures (including critical care time)  Medications Ordered in ED Medications - No data to display   Initial Impression / Assessment and Plan / ED Course  I have reviewed the triage vital signs and the nursing notes.  Pertinent labs & imaging results that were available during my care of the patient were reviewed by me and considered in my medical decision making (see chart for details).     Patient being brought in by IVC which stated that she was a harm to herself and her daughter because she was overdosing on heroin. Patient does admit to using heroin but states she is not using it in a manner to hurt herself. She states that she isgoing to call places tomorrow to talk about detox. She denies any current suicidal thoughts or plans. She is awake and alert and even before Narcan by EMS she was not unconscious or having trouble protecting her airway. On lab work patient is found to be anemic however a year ago she was also anemic. Unclear what is causing her anemia may be from vaginal bleeding. Patient states she does eat a lot of ice but has no acute symptoms concerning for acute anemia. Discussed with patient following up with her PCP and she  will be started on iron. Will have TTS evaluate.  Final Clinical Impressions(s) / ED Diagnoses   Final diagnoses:  Polysubstance abuse  Involuntary commitment  Anemia, unspecified type    New Prescriptions New Prescriptions   FERROUS SULFATE 325 (65 FE) MG TABLET    Take 1 tablet (325 mg total) by mouth daily.     Blanchie Dessert, MD 01/24/17 [phone]

## 2017-01-24 NOTE — BH Assessment (Addendum)
Assessment Note  Terri [name] is an 41 y.o. female. Pt brought to [facility] under IVC taken out by sister.  Pt admits to using heroin today, a "20 piece" and reports she uses this amount every other day.  Pt reports she was using heroin today and next thing she knew the police were there and told her she had to come to ED.  Pt gave permission for TTS to contact sister, Marily Lente, [phone].  Lattie Haw reports that pt's cousin found pt unresponsive this AM and called 911.  Lattie Haw came by shortly afterwards and police advised her to take out IVC paperwork, which she did.  Lattie Haw reports this is the 4th time they have called due to pt overdose.  Lattie Haw reports pt needs help with heroin addiction and that she has been "up for days" using drugs.  States that when she runs out of drugs she cuts herself.  Lattie Haw reports last time pt has cut herself that Lattie Haw is aware of was about 10 days ago.   Pt denies SI/HI/AV today.  SI is not alleged by sister, only self cutting.  Pt denies use of alcohol or other drugs and UDS only positive for opiates.  Pt reports she does get withdrawal symptoms but does not have any currently.  Pt reports she knows she needs to go to treatment but has never been before.  Pt reports outpt treatment about 5 years ago for psych.  Diagnosis: opiate use disorder  Past Medical History:  Past Medical History:  Diagnosis Date  . Bipolar disorder ([place])   . Chronic back pain   . Hypertension   . Migraine headache   . Sciatica   . Stress incontinence     Past Surgical History:  Procedure Laterality Date  . ANKLE SURGERY    . BACK SURGERY    . CESAREAN SECTION    . FRACTURE SURGERY    . GASTRIC BYPASS    . LUMBAR DISC SURGERY    . LUMBAR FUSION    . TUBAL LIGATION      Family History:  Family History  Problem Relation Age of Onset  . Diabetes Mother   . Hypertension Mother   . Diabetes Father   . Hypertension Father     Social History:  reports that she has been smoking Cigarettes.   She has been smoking about 1.00 pack per day. She has never used smokeless tobacco. She reports that she uses drugs, including IV. She reports that she does not drink alcohol.  Additional Social History:  Alcohol / Drug Use Pain Medications: heroin Prescriptions: pt denies Over the Counter: pt denies History of alcohol / drug use?: Yes Negative Consequences of Use: Personal relationships Withdrawal Symptoms:  (pt reports history of withdrawals but none currently) Substance #1 Name of Substance 1: heroin 1 - Age of First Use: 40 1 - Amount (size/oz): "20 piece" 1 - Frequency: every other day 1 - Duration: 1 year 1 - Last Use / Amount: today, 20 piece  CIWA: CIWA-Ar BP: (!) 114/93 Pulse Rate: 92 COWS:    Allergies:  Allergies  Allergen Reactions  . Ketorolac Tromethamine Itching and Nausea And Vomiting  . Methocarbamol Diarrhea    Home Medications:  (Not in a hospital admission)  OB/GYN Status:  Patient's last menstrual period was 01/24/2017.  General Assessment Data Location of Assessment: [facility] ED TTS Assessment: In system Is this a Tele or Face-to-Face Assessment?: Tele Assessment Is this an Initial Assessment or a Re-assessment for  this encounter?: Initial Assessment Marital status: Divorced Is patient pregnant?: Unknown Pregnancy Status: Unknown Living Arrangements: Spouse/significant other, Children, Parent (daughter and father and fiancee) Can pt return to current living arrangement?: Yes Admission Status: Involuntary Referral Source: Self/Family/Friend     Crisis Care Plan Living Arrangements: Spouse/significant other, Children, Parent (daughter and father and fiancee) Name of Psychiatrist: none Name of Therapist: none     Risk to self with the past 6 months Suicidal Ideation: No Has patient been a risk to self within the past 6 months prior to admission? : No Suicidal Intent: No Has patient had any suicidal intent within the past 6 months prior to  admission? : No Is patient at risk for suicide?: No Suicidal Plan?: No Has patient had any suicidal plan within the past 6 months prior to admission? : No Access to Means: No What has been your use of drugs/alcohol within the last 12 months?: current significant heroin use Previous Attempts/Gestures: No Intentional Self Injurious Behavior: Cutting Comment - Self Injurious Behavior: pt reports history of self cutting Family Suicide History: Yes (aunt, two cousins) Recent stressful life event(s): Other (Comment) (pt reports her sister and father are in process of moving ou) Persecutory voices/beliefs?: No Depression: No Substance abuse history and/or treatment for substance abuse?: Yes  Risk to Others within the past 6 months Homicidal Ideation: No Does patient have any lifetime risk of violence toward others beyond the six months prior to admission? : No Thoughts of Harm to Others: No Current Homicidal Intent: No Current Homicidal Plan: No Access to Homicidal Means: No History of harm to others?: No Assessment of Violence: None Noted Does patient have access to weapons?: No Criminal Charges Pending?: No Does patient have a court date: No Is patient on probation?: No  Psychosis Hallucinations: None noted Delusions: None noted  Mental Status Report Appearance/Hygiene: Disheveled Eye Contact: Fair Motor Activity: Gestures Speech: Logical/coherent Level of Consciousness: Alert Mood: Pleasant Affect: Appropriate to circumstance Anxiety Level: Moderate Thought Processes: Relevant Judgement: Unimpaired Orientation: Person, Place, Time, Situation Obsessive Compulsive Thoughts/Behaviors: None  Cognitive Functioning Concentration: Normal Memory: Recent Intact, Remote Intact IQ: Average Insight: Fair Impulse Control: Poor Appetite: Good Weight Loss: 0 Weight Gain: 0 Sleep: Decreased Total Hours of Sleep: 4 Vegetative Symptoms: None  ADLScreening [facility] Assessment  Services) Patient's cognitive ability adequate to safely complete daily activities?: Yes Patient able to express need for assistance with ADLs?: Yes Independently performs ADLs?: Yes (appropriate for developmental age)  Prior Inpatient Therapy Prior Inpatient Therapy: No  Prior Outpatient Therapy Prior Outpatient Therapy: Yes Prior Therapy Dates: 2013? Prior Therapy Facilty/Provider(s): [facility] Reason for Treatment: psych: meds and counseling Does patient have an ACCT team?: No Does patient have Intensive In-House Services?  : No Does patient have [facility] services? : No Does patient have P4CC services?: No  ADL Screening (condition at time of admission) Patient's cognitive ability adequate to safely complete daily activities?: Yes Patient able to express need for assistance with ADLs?: Yes Independently performs ADLs?: Yes (appropriate for developmental age)       Abuse/Neglect Assessment (Assessment to be complete while patient is alone) Physical Abuse: Denies Verbal Abuse: Denies Sexual Abuse: Denies Exploitation of patient/patient's resources: Denies Self-Neglect: Denies     Regulatory affairs officer (For Healthcare) Does Patient Have a Medical Advance Directive?: No Would patient like information on creating a medical advance directive?: No - Patient declined    Additional Information 1:1 In Past 12 Months?: No CIRT Risk: No Elopement Risk: No Does  patient have medical clearance?: Yes     Disposition: TTS spoke to Lindon Romp of [facility] who recommends AM psych eval. Disposition Initial Assessment Completed for this Encounter: Yes  On Site Evaluation by:   Reviewed with Physician:    Joanne Chars 01/24/2017 9:31 PM

## 2017-01-25 DIAGNOSIS — F1910 Other psychoactive substance abuse, uncomplicated: Secondary | ICD-10-CM

## 2017-01-25 DIAGNOSIS — T1491XA Suicide attempt, initial encounter: Secondary | ICD-10-CM | POA: Diagnosis not present

## 2017-01-25 DIAGNOSIS — F17210 Nicotine dependence, cigarettes, uncomplicated: Secondary | ICD-10-CM | POA: Diagnosis not present

## 2017-01-25 DIAGNOSIS — T401X2A Poisoning by heroin, intentional self-harm, initial encounter: Secondary | ICD-10-CM

## 2017-01-25 DIAGNOSIS — R450 Nervousness: Secondary | ICD-10-CM | POA: Diagnosis not present

## 2017-01-25 DIAGNOSIS — F419 Anxiety disorder, unspecified: Secondary | ICD-10-CM

## 2017-01-25 DIAGNOSIS — R4587 Impulsiveness: Secondary | ICD-10-CM

## 2017-01-25 DIAGNOSIS — F1114 Opioid abuse with opioid-induced mood disorder: Secondary | ICD-10-CM | POA: Diagnosis present

## 2017-01-25 MED ORDER — TRAZODONE HCL 50 MG PO TABS
50.0000 mg | ORAL_TABLET | Freq: Every evening | ORAL | Status: DC | PRN
  Administered 2017-01-25: 50 mg via ORAL
  Filled 2017-01-25: qty 1

## 2017-01-25 MED ORDER — LISINOPRIL 10 MG PO TABS
10.0000 mg | ORAL_TABLET | Freq: Every day | ORAL | Status: DC
  Administered 2017-01-25 – 2017-01-26 (×2): 10 mg via ORAL
  Filled 2017-01-25 (×3): qty 1

## 2017-01-25 MED ORDER — ACETAMINOPHEN 500 MG PO TABS
1000.0000 mg | ORAL_TABLET | Freq: Once | ORAL | Status: AC
  Administered 2017-01-25: 1000 mg via ORAL
  Filled 2017-01-25: qty 2

## 2017-01-25 MED ORDER — POTASSIUM CHLORIDE CRYS ER 20 MEQ PO TBCR
20.0000 meq | EXTENDED_RELEASE_TABLET | Freq: Two times a day (BID) | ORAL | Status: AC
  Administered 2017-01-25 (×2): 20 meq via ORAL
  Filled 2017-01-25 (×2): qty 1

## 2017-01-25 MED ORDER — POTASSIUM CHLORIDE 20 MEQ PO PACK: 20.0000 meq | PACK | Freq: Two times a day (BID) | ORAL | Status: DC

## 2017-01-25 MED ORDER — LISINOPRIL-HYDROCHLOROTHIAZIDE 10-12.5 MG PO TABS: 1.0000 | ORAL_TABLET | Freq: Every day | ORAL | Status: DC

## 2017-01-25 MED ORDER — GABAPENTIN 300 MG PO CAPS
300.0000 mg | ORAL_CAPSULE | Freq: Three times a day (TID) | ORAL | Status: DC
  Administered 2017-01-25 – 2017-01-26 (×3): 300 mg via ORAL
  Filled 2017-01-25 (×3): qty 1

## 2017-01-25 MED ORDER — AMLODIPINE BESYLATE 5 MG PO TABS
10.0000 mg | ORAL_TABLET | Freq: Every day | ORAL | Status: DC
  Administered 2017-01-25 – 2017-01-26 (×2): 10 mg via ORAL
  Filled 2017-01-25 (×2): qty 2

## 2017-01-25 MED ORDER — HYDROCHLOROTHIAZIDE 12.5 MG PO CAPS
12.5000 mg | ORAL_CAPSULE | Freq: Every day | ORAL | Status: DC
  Administered 2017-01-25 – 2017-01-26 (×2): 12.5 mg via ORAL
  Filled 2017-01-25 (×3): qty 1

## 2017-01-25 MED ORDER — DULOXETINE HCL 30 MG PO CPEP
60.0000 mg | ORAL_CAPSULE | Freq: Every day | ORAL | Status: DC
  Administered 2017-01-25 – 2017-01-26 (×2): 60 mg via ORAL
  Filled 2017-01-25 (×2): qty 2

## 2017-01-25 NOTE — Progress Notes (Signed)
Pt in bed at assessment.  Pt is awake but drowsy.  Pt sts she did not d/c today because she fell.  Pt sts she is stable and is not dizzy or having difficulty walking.  Pt advised to let staff know when she wishes to go to the bathroom. Pt denies SI, HI and AVH.  Pt contracts for safety verbally. Pt sts she has some lower back pain but does not ask for medication.  Pt sts she has no withdrawal symptoms.  Pts teet assessed visually.  Pt has "meth mouth" appearance with decay visable.  Pt observerd on hourly and 15 min rounds.  Pt offered support and encouragement. Pt remains safe on unit.

## 2017-01-25 NOTE — Progress Notes (Signed)
01/25/17 1348:  LRT went to pt room to offer activities, pt was asleep.   Victorino Sparrow, LRT/CTRS

## 2017-01-25 NOTE — Consult Note (Addendum)
[facility] Consult   Reason for Consult:  Overdose Referring Physician:  EDP  Patient Identification: Terri [name] MRN:  [id number] Principal Diagnosis: Opioid abuse with opioid-induced mood disorder [facility]) Diagnosis:   Patient Active Problem List   Diagnosis Date Noted  . Opioid abuse with opioid-induced mood disorder ([place]) [F11.14] 01/25/2017  . Anxiety [F41.9] 02/11/2014  . Depression [F32.9] 07/12/2012    Total Time spent with patient: 45 minutes  Subjective:   Terri [name] is a 41 y.o. female patient admitted with suicide attempt by overdose.   HPI:  Pt was seen and chart reviewed with Dr Darleene Cleaver. Pt denies this was a suicide attempt by heroin overdose. Pt stated she has been using for the past year and is just hanging around the wrong people. I realized on Friday I need help and made the decision to get help. I used 20$ worth of heroin and my family thought I overdosed. Peer support will visit patient today for recovery options. Pt will remain under IVC and in the SAPPU overnight for re-evaluation in the AM.   Past Psychiatric History: As above  Risk to Self: None Risk to Others: None Prior Inpatient Therapy: Prior Inpatient Therapy: No Prior Outpatient Therapy: Prior Outpatient Therapy: Yes Prior Therapy Dates: 2013? Prior Therapy Facilty/Provider(s): [facility] Reason for Treatment: psych: meds and counseling Does patient have an ACCT team?: No Does patient have Intensive In-House Services?  : No Does patient have [facility] services? : No Does patient have P4CC services?: No  Past Medical History:  Past Medical History:  Diagnosis Date  . Bipolar disorder ([place])   . Chronic back pain   . Hypertension   . Migraine headache   . Sciatica   . Stress incontinence     Past Surgical History:  Procedure Laterality Date  . ANKLE SURGERY    . BACK SURGERY    . CESAREAN SECTION    . FRACTURE SURGERY    . GASTRIC BYPASS    . LUMBAR DISC SURGERY    .  LUMBAR FUSION    . TUBAL LIGATION     Family History:  Family History  Problem Relation Age of Onset  . Diabetes Mother   . Hypertension Mother   . Diabetes Father   . Hypertension Father    Family Psychiatric  History: Unknown Social History:  History  Alcohol Use No     History  Drug Use  . Types: IV    Comment: Heroin    Social History   Social History  . Marital status: Divorced    Spouse name: N/A  . Number of children: N/A  . Years of education: N/A   Social History Main Topics  . Smoking status: Current Every Day Smoker    Packs/day: 1.00    Types: Cigarettes  . Smokeless tobacco: Never Used  . Alcohol use No  . Drug use: Yes    Types: IV     Comment: Heroin  . Sexual activity: Yes    Birth control/ protection: None   Other Topics Concern  . None   Social History Narrative  . None   Additional Social History:    Allergies:   Allergies  Allergen Reactions  . Ketorolac Tromethamine Itching and Nausea And Vomiting  . Methocarbamol Diarrhea    Labs:  Results for orders placed or performed during the hospital encounter of 01/24/17 (from the past 48 hour(s))  Comprehensive metabolic panel     Status: Abnormal  Collection Time: 01/24/17  7:51 PM  Result Value Ref Range   Sodium 137 135 - 145 mmol/L   Potassium 3.1 (L) 3.5 - 5.1 mmol/L   Chloride 104 101 - 111 mmol/L   CO2 25 22 - 32 mmol/L   Glucose, Bld 105 (H) 65 - 99 mg/dL   BUN 8 6 - 20 mg/dL   Creatinine, Ser 0.66 0.44 - 1.00 mg/dL   Calcium 8.9 8.9 - 10.3 mg/dL   Total Protein 6.5 6.5 - 8.1 g/dL   Albumin 3.7 3.5 - 5.0 g/dL   AST 64 (H) 15 - 41 U/L   ALT 27 14 - 54 U/L   Alkaline Phosphatase 86 38 - 126 U/L   Total Bilirubin 0.8 0.3 - 1.2 mg/dL   GFR calc non Af Amer >60 >60 mL/min   GFR calc Af Amer >60 >60 mL/min    Comment: (NOTE) The eGFR has been calculated using the CKD EPI equation. This calculation has not been validated in all clinical situations. eGFR's persistently <60  mL/min signify possible Chronic Kidney Disease.    Anion gap 8 5 - 15  Ethanol     Status: None   Collection Time: 01/24/17  7:51 PM  Result Value Ref Range   Alcohol, Ethyl (B) <5 <5 mg/dL    Comment:        LOWEST DETECTABLE LIMIT FOR SERUM ALCOHOL IS 5 mg/dL FOR MEDICAL PURPOSES ONLY   Salicylate level     Status: None   Collection Time: 01/24/17  7:51 PM  Result Value Ref Range   Salicylate Lvl <1.4 2.8 - 30.0 mg/dL  Acetaminophen level     Status: Abnormal   Collection Time: 01/24/17  7:51 PM  Result Value Ref Range   Acetaminophen (Tylenol), Serum <10 (L) 10 - 30 ug/mL    Comment:        THERAPEUTIC CONCENTRATIONS VARY SIGNIFICANTLY. A RANGE OF 10-30 ug/mL MAY BE AN EFFECTIVE CONCENTRATION FOR MANY PATIENTS. HOWEVER, SOME ARE BEST TREATED AT CONCENTRATIONS OUTSIDE THIS RANGE. ACETAMINOPHEN CONCENTRATIONS >150 ug/mL AT 4 HOURS AFTER INGESTION AND >50 ug/mL AT 12 HOURS AFTER INGESTION ARE OFTEN ASSOCIATED WITH TOXIC REACTIONS.   cbc     Status: Abnormal   Collection Time: 01/24/17  7:51 PM  Result Value Ref Range   WBC 9.6 4.0 - 10.5 K/uL   RBC 3.92 3.87 - 5.11 MIL/uL   Hemoglobin 8.6 (L) 12.0 - 15.0 g/dL   HCT 25.8 (L) 36.0 - 46.0 %   MCV 65.8 (L) 78.0 - 100.0 fL   MCH 21.9 (L) 26.0 - 34.0 pg   MCHC 33.3 30.0 - 36.0 g/dL   RDW 19.5 (H) 11.5 - 15.5 %   Platelets 250 150 - 400 K/uL  I-Stat beta hCG blood, ED     Status: None   Collection Time: 01/24/17  7:57 PM  Result Value Ref Range   I-stat hCG, quantitative <5.0 <5 mIU/mL   Comment 3            Comment:   GEST. AGE      CONC.  (mIU/mL)   <=1 WEEK        5 - 50     2 WEEKS       50 - 500     3 WEEKS       100 - 10,000     4 WEEKS     1,000 - 30,000        FEMALE AND NON-PREGNANT  FEMALE:     LESS THAN 5 mIU/mL   Rapid urine drug screen (hospital performed)     Status: Abnormal   Collection Time: 01/24/17  8:31 PM  Result Value Ref Range   Opiates POSITIVE (A) NONE DETECTED   Cocaine NONE DETECTED  NONE DETECTED   Benzodiazepines NONE DETECTED NONE DETECTED   Amphetamines NONE DETECTED NONE DETECTED   Tetrahydrocannabinol NONE DETECTED NONE DETECTED   Barbiturates NONE DETECTED NONE DETECTED    Comment:        DRUG SCREEN FOR MEDICAL PURPOSES ONLY.  IF CONFIRMATION IS NEEDED FOR ANY PURPOSE, NOTIFY LAB WITHIN 5 DAYS.        LOWEST DETECTABLE LIMITS FOR URINE DRUG SCREEN Drug Class       Cutoff (ng/mL) Amphetamine      1000 Barbiturate      200 Benzodiazepine   758 Tricyclics       832 Opiates          300 Cocaine          300 THC              50     Current Facility-Administered Medications  Medication Dose Route Frequency Provider Last Rate Last Dose  . amLODipine (NORVASC) tablet 10 mg  10 mg Oral Daily , , MD   10 mg at 01/25/17 1311  . DULoxetine (CYMBALTA) DR capsule 60 mg  60 mg Oral Daily , , MD   60 mg at 01/25/17 1311  . gabapentin (NEURONTIN) capsule 300 mg  300 mg Oral TID , , MD      . lisinopril (PRINIVIL,ZESTRIL) tablet 10 mg  10 mg Oral Daily , , MD   10 mg at 01/25/17 1311   And  . hydrochlorothiazide (MICROZIDE) capsule 12.5 mg  12.5 mg Oral Daily , , MD   12.5 mg at 01/25/17 1311  . potassium chloride SA (K-DUR,KLOR-CON) CR tablet 20 mEq  20 mEq Oral BID Darleene Cleaver, , MD   20 mEq at 01/25/17 1247  . traZODone (DESYREL) tablet 50 mg  50 mg Oral QHS PRN Corena Pilgrim, MD       Current Outpatient Prescriptions  Medication Sig Dispense Refill  . acetaminophen (TYLENOL) 500 MG tablet Take 1,000 mg by mouth every 6 (six) hours as needed for moderate pain (pain).    Marland Kitchen albuterol (PROVENTIL HFA;VENTOLIN HFA) 108 (90 BASE) MCG/ACT inhaler Inhale 1-2 puffs into the lungs every 4 (four) hours as needed for wheezing or shortness of breath. 1 Inhaler 3  . amitriptyline (ELAVIL) 75 MG tablet Take 1 tablet (75 mg total) by mouth at bedtime.    Marland Kitchen amLODipine (NORVASC) 10 MG tablet Take 10 mg by  mouth daily.    . DULoxetine (CYMBALTA) 60 MG capsule Take 60 mg by mouth daily.  4  . gabapentin (NEURONTIN) 300 MG capsule Take 1 capsule (300 mg total) by mouth 3 (three) times daily.    Marland Kitchen lisinopril-hydrochlorothiazide (PRINZIDE,ZESTORETIC) 10-12.5 MG tablet Take 1 tablet by mouth daily.    Marland Kitchen aspirin 81 MG chewable tablet Chew 1 tablet (81 mg total) by mouth daily. (Patient not taking: Reported on 01/24/2017) 30 tablet 0  . benzonatate (TESSALON) 100 MG capsule Take 1 capsule (100 mg total) by mouth every 8 (eight) hours. (Patient not taking: Reported on 01/24/2017) 21 capsule 0  . cyclobenzaprine (FLEXERIL) 10 MG tablet Take 1 tablet (10 mg total) by mouth 2 (two) times daily as needed for muscle spasms. (Patient not  taking: Reported on 01/24/2017) 20 tablet 0  . ferrous sulfate 325 (65 FE) MG tablet Take 1 tablet (325 mg total) by mouth daily. 30 tablet 2  . levofloxacin (LEVAQUIN) 750 MG tablet Take 1 tablet (750 mg total) by mouth daily. X 7 days (Patient not taking: Reported on 01/24/2017) 7 tablet 0  . predniSONE (DELTASONE) 50 MG tablet Take 1 tablet daily with breakfast (Patient not taking: Reported on 01/24/2017) 5 tablet 0    Musculoskeletal: Strength & Muscle Tone: within normal limits Gait & Station: normal Patient leans: N/A  Psychiatric Specialty Exam: Physical Exam  Constitutional: She appears well-developed.  HENT:  Head: Normocephalic.  Respiratory: Effort normal.  Musculoskeletal: Normal range of motion.  Neurological: She is alert.  Psychiatric: Her mood appears anxious. Her speech is rapid and/or pressured. Cognition and memory are normal. She expresses impulsivity. She exhibits a depressed mood. She expresses suicidal ideation.    Review of Systems  Psychiatric/Behavioral: Positive for depression, substance abuse and suicidal ideas. Negative for hallucinations and memory loss. The patient is nervous/anxious. The patient does not have insomnia.   All other systems reviewed  and are negative.   Blood pressure 137/81, pulse 82, temperature 98.3 F (36.8 C), temperature source Oral, resp. rate 16, weight 91.6 kg (202 lb), last menstrual period 01/24/2017, SpO2 100 %.Body mass index is 29.83 kg/m.  General Appearance: Disheveled  Eye Contact:  Fair  Speech:  Clear and Coherent and Pressured  Volume:  Normal  Mood:  Anxious and Depressed  Affect:  Congruent and Depressed  Thought Process:  Coherent, Goal Directed and Linear  Orientation:  Full (Time, Place, and Person)  Thought Content:  Logical  Suicidal Thoughts:  No  Homicidal Thoughts:  No  Memory:  Immediate;   Good Recent;   Fair Remote;   Fair  Judgement:  Fair  Insight:  Lacking  Psychomotor Activity:  Normal  Concentration:  Concentration: Good and Attention Span: Good  Recall:  Good  Fund of Knowledge:  Good  Language:  Good  Akathisia:  No  Handed:  Right  AIMS (if indicated):     Assets:  Communication Skills Desire for Improvement Housing Resilience Social Support  ADL's:  Intact  Cognition:  WNL  Sleep:        Treatment Plan Summary: Daily contact with patient to assess and evaluate symptoms and progress in treatment and Medication management (see MAR)  Disposition: Observe overnight in the SAP[facility] and re-evaluate in the AM  Ethelene Hal, NP 01/25/2017 3:11 PM  Patient seen face-to-face for psychiatric evaluation, chart reviewed and case discussed with the physician extender and developed treatment plan. Reviewed the information documented and agree with the treatment plan. Corena Pilgrim, MD
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
## 2017-01-25 NOTE — ED Notes (Signed)
Introduced self to patient. Pt oriented to unit expectations.  Assessed pt for:  A) Anxiety &/or agitation: Pt has been calm and cooperative this morning. She is a high fall risk and being monitored closely. Denies SI/HI/AVH and would like to go home.   S) Safety: Safety maintained with q-15-minute checks and hourly rounds by staff.  A) ADLs: Pt able to perform ADLs independently.  P) Pick-Up (room cleanliness): Pt's room clean and free of clutter.

## 2017-01-25 NOTE — ED Notes (Signed)
Patient has rx for ferrous sulfate 325 mg in the front of her chart that needs to be given to her upon discharge or transfer.

## 2017-01-25 NOTE — ED Notes (Signed)
Pt alert and oriented x4. Complains of back pain after fall. Informed staff when she needed to go to the bathroom and staff assisted her to the toilet. Her gait was unsteady and she was encouraged to hold onto the rail on the wall in addition to staff x1 holding her arm. Pt encouraged to not get out of bed without staff assist. She agreed.

## 2017-01-25 NOTE — ED Notes (Signed)
Pt is now more steady on her feet.

## 2017-01-25 NOTE — ED Notes (Signed)
Per Jinny Blossom, NP, post fall every four hour VS check may be discontinued. Pt has voiced no complaints and is not unsteady at this time.

## 2017-01-25 NOTE — Progress Notes (Signed)
Pt fell at 0312 when she was transferring from bed to chair.  She fell on her buttocks and complained of pain of 5/10.  Pt has normal ROM, no redness, no bruising, no break in skin.  She was apologetic to staff after fall because she agreed to follow fall prevention techniques when they were explained to her by writer earlier tonight when Ambien 5 mg PO PRN was administered.  She states "I'm sorry" and reports she will not try to ambulate without staff present for assistance if needed.  Vitals obtained, see flowsheet.  Dr. Stark Jock notified of fall and acetaminophen 1g POX1 was ordered and administered for pain.  Charge nurse and [facility] aware.  High fall risk wristband placed on pt and high fall risk sticker on check sheet.  Pt reports she is going to try to get more rest.  Reports she will inform staff of needs and concerns.  Will continue to monitor and assess.

## 2017-01-26 NOTE — Discharge Instructions (Signed)
To help you maintain a sober lifestyle, a substance abuse treatment program may be beneficial to you.  Contact one of the following providers at your earliest opportunity to ask about enrolling in their program:  OUTPATIENT PROGRAMS:       [facility]      [address], [place] [address]      [phone]      New patients are seen at the walk-in clinic every Tuesday from 9:00 am - 12:00 pm       [facility]      [place], [place] [address]      [phone]

## 2017-01-26 NOTE — [facility] Suicide Risk Assessment (Signed)
Suicide Risk Assessment  Discharge Assessment   [facility] Discharge Suicide Risk Assessment   Principal Problem: Opioid abuse with opioid-induced mood disorder [facility]) Discharge Diagnoses:  Patient Active Problem List   Diagnosis Date Noted  . Opioid abuse with opioid-induced mood disorder ([place]) [F11.14] 01/25/2017  . Anxiety [F41.9] 02/11/2014  . Depression [F32.9] 07/12/2012    Total Time spent with patient: 45 minutes  Musculoskeletal: Strength & Muscle Tone: within normal limits Gait & Station: normal Patient leans: N/A  Psychiatric Specialty Exam: Physical Exam  Constitutional: She appears well-developed.  HENT:  Head: Normocephalic.  Respiratory: Effort normal.  Musculoskeletal: Normal range of motion.  Neurological: She is alert.  Psychiatric: Her mood appears anxious. Her speech is rapid and/or pressured. Cognition and memory are normal. She expresses impulsivity. She exhibits a depressed mood. She expresses suicidal ideation.   Review of Systems  Psychiatric/Behavioral: Positive for depression, substance abuse and suicidal ideas. Negative for hallucinations and memory loss. The patient is nervous/anxious. The patient does not have insomnia.   All other systems reviewed and are negative.  Blood pressure (!) 149/98, pulse 71, temperature 98.9 F (37.2 C), temperature source Oral, resp. rate 16, weight 91.6 kg (202 lb), last menstrual period 01/24/2017, SpO2 97 %.Body mass index is 29.83 kg/m. General Appearance: Casual Eye Contact:  Good Speech:  Clear and Coherent Volume:  Normal Mood:  Anxious and Depressed Affect:  Congruent and Depressed Thought Process:  Coherent, Goal Directed and Linear Orientation:  Full (Time, Place, and Person) Thought Content:  Logical Suicidal Thoughts:  No Homicidal Thoughts:  No Memory:  Immediate;   Good Recent;   Fair Remote;   Fair Judgement:  Fair Insight:  Lacking Psychomotor Activity:  Normal Concentration:  Concentration:  Good and Attention Span: Good Recall:  Good Fund of Knowledge:  Good Language:  Good Akathisia:  No Handed:  Right AIMS (if indicated):    Assets:  Communication Skills Desire for Improvement Housing Resilience Social Support ADL's:  Intact Cognition:  WNL  Mental Status Per Nursing Assessment::   On Admission:   suicidal and depressed  Demographic Factors:  Caucasian, Low socioeconomic status and Unemployed  Loss Factors: Financial problems/change in socioeconomic status  Historical Factors: Impulsivity  Risk Reduction Factors:   Sense of responsibility to family and Living with another person, especially a relative  Continued Clinical Symptoms:  Depression:   Impulsivity Alcohol/Substance Abuse/Dependencies More than one psychiatric diagnosis  Cognitive Features That Contribute To Risk:  Closed-mindedness    Suicide Risk:  Minimal: No identifiable suicidal ideation.  Patients presenting with no risk factors but with morbid ruminations; may be classified as minimal risk based on the severity of the depressive symptoms  Follow-up Information    Please follow up.   Contact information: need to follow up with your doctor this week for further testing of your anemia          Plan Of Care/Follow-up recommendations:  Activity:  as tolerated Diet:  Heart Healthy  Ethelene Hal, NP 01/26/2017, 11:45 AM
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
## 2017-01-26 NOTE — Consult Note (Signed)
[place] [facility] Consult   Reason for Consult:  Overdose Referring Physician:  EDP  Patient Identification: Terri [name] MRN:  [id number] Principal Diagnosis: Opioid abuse with opioid-induced mood disorder [facility]) Diagnosis:   Patient Active Problem List   Diagnosis Date Noted  . Opioid abuse with opioid-induced mood disorder ([place]) [F11.14] 01/25/2017  . Anxiety [F41.9] 02/11/2014  . Depression [F32.9] 07/12/2012    Total Time spent with patient: 45 minutes  Subjective:   Terri [name] is a 41 y.o. female patient admitted with suicide attempt by overdose.   HPI:  Pt was seen and chart reviewed with Dr Darleene Cleaver. Pt denies this was a suicide attempt by heroin overdose. Pt stated she has been using for the past year and is just hanging around the wrong people. I realized on Friday I need help and made the decision to get help. IPeer support visited with patient today and presented her with resources for substance abuse treatment facilities. Pt is psychiatrically clear for discharge.   Past Psychiatric History: As above  Risk to Self: None Risk to Others: None Prior Inpatient Therapy: Prior Inpatient Therapy: No Prior Outpatient Therapy: Prior Outpatient Therapy: Yes Prior Therapy Dates: 2013? Prior Therapy Facilty/Provider(s): [facility] Reason for Treatment: psych: meds and counseling Does patient have an ACCT team?: No Does patient have Intensive In-House Services?  : No Does patient have [facility] services? : No Does patient have P4CC services?: No  Past Medical History:  Past Medical History:  Diagnosis Date  . Bipolar disorder ([place])   . Chronic back pain   . Hypertension   . Migraine headache   . Sciatica   . Stress incontinence     Past Surgical History:  Procedure Laterality Date  . ANKLE SURGERY    . BACK SURGERY    . CESAREAN SECTION    . FRACTURE SURGERY    . GASTRIC BYPASS    . LUMBAR DISC SURGERY    . LUMBAR FUSION    . TUBAL LIGATION     Family  History:  Family History  Problem Relation Age of Onset  . Diabetes Mother   . Hypertension Mother   . Diabetes Father   . Hypertension Father    Family Psychiatric  History: Unknown Social History:  History  Alcohol Use No     History  Drug Use  . Types: IV    Comment: Heroin    Social History   Social History  . Marital status: Divorced    Spouse name: N/A  . Number of children: N/A  . Years of education: N/A   Social History Main Topics  . Smoking status: Current Every Day Smoker    Packs/day: 1.00    Types: Cigarettes  . Smokeless tobacco: Never Used  . Alcohol use No  . Drug use: Yes    Types: IV     Comment: Heroin  . Sexual activity: Yes    Birth control/ protection: None   Other Topics Concern  . None   Social History Narrative  . None   Additional Social History:    Allergies:   Allergies  Allergen Reactions  . Ketorolac Tromethamine Itching and Nausea And Vomiting  . Methocarbamol Diarrhea    Labs:  Results for orders placed or performed during the hospital encounter of 01/24/17 (from the past 48 hour(s))  Comprehensive metabolic panel     Status: Abnormal   Collection Time: 01/24/17  7:51 PM  Result Value Ref Range   Sodium  137 135 - 145 mmol/L   Potassium 3.1 (L) 3.5 - 5.1 mmol/L   Chloride 104 101 - 111 mmol/L   CO2 25 22 - 32 mmol/L   Glucose, Bld 105 (H) 65 - 99 mg/dL   BUN 8 6 - 20 mg/dL   Creatinine, Ser 0.66 0.44 - 1.00 mg/dL   Calcium 8.9 8.9 - 10.3 mg/dL   Total Protein 6.5 6.5 - 8.1 g/dL   Albumin 3.7 3.5 - 5.0 g/dL   AST 64 (H) 15 - 41 U/L   ALT 27 14 - 54 U/L   Alkaline Phosphatase 86 38 - 126 U/L   Total Bilirubin 0.8 0.3 - 1.2 mg/dL   GFR calc non Af Amer >60 >60 mL/min   GFR calc Af Amer >60 >60 mL/min    Comment: (NOTE) The eGFR has been calculated using the CKD EPI equation. This calculation has not been validated in all clinical situations. eGFR's persistently <60 mL/min signify possible Chronic  Kidney Disease.    Anion gap 8 5 - 15  Ethanol     Status: None   Collection Time: 01/24/17  7:51 PM  Result Value Ref Range   Alcohol, Ethyl (B) <5 <5 mg/dL    Comment:        LOWEST DETECTABLE LIMIT FOR SERUM ALCOHOL IS 5 mg/dL FOR MEDICAL PURPOSES ONLY   Salicylate level     Status: None   Collection Time: 01/24/17  7:51 PM  Result Value Ref Range   Salicylate Lvl <9.3 2.8 - 30.0 mg/dL  Acetaminophen level     Status: Abnormal   Collection Time: 01/24/17  7:51 PM  Result Value Ref Range   Acetaminophen (Tylenol), Serum <10 (L) 10 - 30 ug/mL    Comment:        THERAPEUTIC CONCENTRATIONS VARY SIGNIFICANTLY. A RANGE OF 10-30 ug/mL MAY BE AN EFFECTIVE CONCENTRATION FOR MANY PATIENTS. HOWEVER, SOME ARE BEST TREATED AT CONCENTRATIONS OUTSIDE THIS RANGE. ACETAMINOPHEN CONCENTRATIONS >150 ug/mL AT 4 HOURS AFTER INGESTION AND >50 ug/mL AT 12 HOURS AFTER INGESTION ARE OFTEN ASSOCIATED WITH TOXIC REACTIONS.   cbc     Status: Abnormal   Collection Time: 01/24/17  7:51 PM  Result Value Ref Range   WBC 9.6 4.0 - 10.5 K/uL   RBC 3.92 3.87 - 5.11 MIL/uL   Hemoglobin 8.6 (L) 12.0 - 15.0 g/dL   HCT 25.8 (L) 36.0 - 46.0 %   MCV 65.8 (L) 78.0 - 100.0 fL   MCH 21.9 (L) 26.0 - 34.0 pg   MCHC 33.3 30.0 - 36.0 g/dL   RDW 19.5 (H) 11.5 - 15.5 %   Platelets 250 150 - 400 K/uL  I-Stat beta hCG blood, ED     Status: None   Collection Time: 01/24/17  7:57 PM  Result Value Ref Range   I-stat hCG, quantitative <5.0 <5 mIU/mL   Comment 3            Comment:   GEST. AGE      CONC.  (mIU/mL)   <=1 WEEK        5 - 50     2 WEEKS       50 - 500     3 WEEKS       100 - 10,000     4 WEEKS     1,000 - 30,000        FEMALE AND NON-PREGNANT FEMALE:     LESS THAN 5 mIU/mL   Rapid urine drug  screen (hospital performed)     Status: Abnormal   Collection Time: 01/24/17  8:31 PM  Result Value Ref Range   Opiates POSITIVE (A) NONE DETECTED   Cocaine NONE DETECTED NONE DETECTED   Benzodiazepines  NONE DETECTED NONE DETECTED   Amphetamines NONE DETECTED NONE DETECTED   Tetrahydrocannabinol NONE DETECTED NONE DETECTED   Barbiturates NONE DETECTED NONE DETECTED    Comment:        DRUG SCREEN FOR MEDICAL PURPOSES ONLY.  IF CONFIRMATION IS NEEDED FOR ANY PURPOSE, NOTIFY LAB WITHIN 5 DAYS.        LOWEST DETECTABLE LIMITS FOR URINE DRUG SCREEN Drug Class       Cutoff (ng/mL) Amphetamine      1000 Barbiturate      200 Benzodiazepine   631 Tricyclics       497 Opiates          300 Cocaine          300 THC              50     Current Facility-Administered Medications  Medication Dose Route Frequency Provider Last Rate Last Dose  . amLODipine (NORVASC) tablet 10 mg  10 mg Oral Daily Kroy Sprung, MD   10 mg at 01/26/17 1019  . DULoxetine (CYMBALTA) DR capsule 60 mg  60 mg Oral Daily Karimah Winquist, MD   60 mg at 01/26/17 1019  . gabapentin (NEURONTIN) capsule 300 mg  300 mg Oral TID Corena Pilgrim, MD   300 mg at 01/26/17 1019  . lisinopril (PRINIVIL,ZESTRIL) tablet 10 mg  10 mg Oral Daily Bethania Schlotzhauer, MD   10 mg at 01/26/17 1019   And  . hydrochlorothiazide (MICROZIDE) capsule 12.5 mg  12.5 mg Oral Daily Gudelia Eugene, MD   12.5 mg at 01/26/17 1019  . traZODone (DESYREL) tablet 50 mg  50 mg Oral QHS PRN Corena Pilgrim, MD   50 mg at 01/25/17 2125   Current Outpatient Prescriptions  Medication Sig Dispense Refill  . acetaminophen (TYLENOL) 500 MG tablet Take 1,000 mg by mouth every 6 (six) hours as needed for moderate pain (pain).    Marland Kitchen albuterol (PROVENTIL HFA;VENTOLIN HFA) 108 (90 BASE) MCG/ACT inhaler Inhale 1-2 puffs into the lungs every 4 (four) hours as needed for wheezing or shortness of breath. 1 Inhaler 3  . amitriptyline (ELAVIL) 75 MG tablet Take 1 tablet (75 mg total) by mouth at bedtime.    Marland Kitchen amLODipine (NORVASC) 10 MG tablet Take 10 mg by mouth daily.    . DULoxetine (CYMBALTA) 60 MG capsule Take 60 mg by mouth daily.  4  . gabapentin (NEURONTIN)  300 MG capsule Take 1 capsule (300 mg total) by mouth 3 (three) times daily.    Marland Kitchen lisinopril-hydrochlorothiazide (PRINZIDE,ZESTORETIC) 10-12.5 MG tablet Take 1 tablet by mouth daily.    Marland Kitchen aspirin 81 MG chewable tablet Chew 1 tablet (81 mg total) by mouth daily. (Patient not taking: Reported on 01/24/2017) 30 tablet 0  . benzonatate (TESSALON) 100 MG capsule Take 1 capsule (100 mg total) by mouth every 8 (eight) hours. (Patient not taking: Reported on 01/24/2017) 21 capsule 0  . cyclobenzaprine (FLEXERIL) 10 MG tablet Take 1 tablet (10 mg total) by mouth 2 (two) times daily as needed for muscle spasms. (Patient not taking: Reported on 01/24/2017) 20 tablet 0  . ferrous sulfate 325 (65 FE) MG tablet Take 1 tablet (325 mg total) by mouth daily. 30 tablet 2  . levofloxacin (LEVAQUIN) 750  MG tablet Take 1 tablet (750 mg total) by mouth daily. X 7 days (Patient not taking: Reported on 01/24/2017) 7 tablet 0  . predniSONE (DELTASONE) 50 MG tablet Take 1 tablet daily with breakfast (Patient not taking: Reported on 01/24/2017) 5 tablet 0    Musculoskeletal: Strength & Muscle Tone: within normal limits Gait & Station: normal Patient leans: N/A  Psychiatric Specialty Exam: Physical Exam  Constitutional: She appears well-developed.  HENT:  Head: Normocephalic.  Respiratory: Effort normal.  Musculoskeletal: Normal range of motion.  Neurological: She is alert.  Psychiatric: Her mood appears anxious. Her speech is rapid and/or pressured. Cognition and memory are normal. She expresses impulsivity. She exhibits a depressed mood. She expresses suicidal ideation.    Review of Systems  Psychiatric/Behavioral: Positive for depression, substance abuse and suicidal ideas. Negative for hallucinations and memory loss. The patient is nervous/anxious. The patient does not have insomnia.   All other systems reviewed and are negative.   Blood pressure (!) 149/98, pulse 71, temperature 98.9 F (37.2 C), temperature source  Oral, resp. rate 16, weight 91.6 kg (202 lb), last menstrual period 01/24/2017, SpO2 97 %.Body mass index is 29.83 kg/m.  General Appearance: Casual  Eye Contact:  Good  Speech:  Clear and Coherent  Volume:  Normal  Mood:  Anxious and Depressed  Affect:  Congruent and Depressed  Thought Process:  Coherent, Goal Directed and Linear  Orientation:  Full (Time, Place, and Person)  Thought Content:  Logical  Suicidal Thoughts:  No  Homicidal Thoughts:  No  Memory:  Immediate;   Good Recent;   Fair Remote;   Fair  Judgement:  Fair  Insight:  Lacking  Psychomotor Activity:  Normal  Concentration:  Concentration: Good and Attention Span: Good  Recall:  Good  Fund of Knowledge:  Good  Language:  Good  Akathisia:  No  Handed:  Right  AIMS (if indicated):     Assets:  Communication Skills Desire for Improvement Housing Resilience Social Support  ADL's:  Intact  Cognition:  WNL  Sleep:        Treatment Plan Summary: Plan Substance induced mood disorder  Discharge Home Follow up with substance abuse treatment resources  Avoid the use of alcohol and illicit drugs  Disposition: No evidence of imminent risk to self or others at present.   Patient does not meet criteria for psychiatric inpatient admission. Supportive therapy provided about ongoing stressors. Discussed crisis plan, support from social network, calling 911, coming to the Emergency Department, and calling Suicide Hotline.  Ethelene Hal, NP 01/26/2017 11:40 AM  Patient seen face-to-face for psychiatric evaluation, chart reviewed and case discussed with the physician extender and developed treatment plan. Reviewed the information documented and agree with the treatment plan. Corena Pilgrim, MD

## 2017-01-26 NOTE — BH Assessment (Signed)
[facility] Assessment Progress Note  Per Corena Pilgrim, MD, this pt does not require psychiatric hospitalization at this time.  Pt presents under IVC initiated by pt's sister, which Dr Darleene Cleaver has rescinded.  Pt is to be discharged from [facility] with referral information area outpatient substance abuse treatment providers.  This has been included in pt's discharge instructions.  Pt's nurse, Nena Jordan, has been notified, as have the peer support providers.  Jalene Mullet, [place] Triage Specialist [phone]

## 2017-01-26 NOTE — ED Notes (Signed)
Pt discharged safely with resources.  Pt denies S/I and H/I.  Pt was in no distress but was very disheveled with body odor.  All belongings were returned to patient.

## 2017-02-14 ENCOUNTER — Observation Stay (HOSPITAL_COMMUNITY)
Admission: AD | Admit: 2017-02-14 | Discharge: 2017-02-16 | Disposition: A | Discharge status: Home / Self Care | Payer: Medicare Other | Source: Ambulatory Visit | Attending: Surgery | Admitting: Surgery

## 2017-02-14 ENCOUNTER — Observation Stay (HOSPITAL_COMMUNITY): Payer: Medicare Other

## 2017-02-14 ENCOUNTER — Ambulatory Visit: Payer: Self-pay | Admitting: Surgery

## 2017-02-14 ENCOUNTER — Encounter (HOSPITAL_COMMUNITY): Payer: Self-pay

## 2017-02-14 DIAGNOSIS — Z86711 Personal history of pulmonary embolism: Secondary | ICD-10-CM | POA: Diagnosis not present

## 2017-02-14 DIAGNOSIS — F1114 Opioid abuse with opioid-induced mood disorder: Secondary | ICD-10-CM | POA: Diagnosis not present

## 2017-02-14 DIAGNOSIS — M549 Dorsalgia, unspecified: Secondary | ICD-10-CM | POA: Insufficient documentation

## 2017-02-14 DIAGNOSIS — F319 Bipolar disorder, unspecified: Secondary | ICD-10-CM | POA: Diagnosis not present

## 2017-02-14 DIAGNOSIS — I10 Essential (primary) hypertension: Secondary | ICD-10-CM | POA: Insufficient documentation

## 2017-02-14 DIAGNOSIS — F17200 Nicotine dependence, unspecified, uncomplicated: Secondary | ICD-10-CM | POA: Diagnosis not present

## 2017-02-14 DIAGNOSIS — Z79899 Other long term (current) drug therapy: Secondary | ICD-10-CM | POA: Insufficient documentation

## 2017-02-14 DIAGNOSIS — K259 Gastric ulcer, unspecified as acute or chronic, without hemorrhage or perforation: Secondary | ICD-10-CM | POA: Insufficient documentation

## 2017-02-14 DIAGNOSIS — F329 Major depressive disorder, single episode, unspecified: Secondary | ICD-10-CM | POA: Diagnosis not present

## 2017-02-14 DIAGNOSIS — G43909 Migraine, unspecified, not intractable, without status migrainosus: Secondary | ICD-10-CM | POA: Insufficient documentation

## 2017-02-14 DIAGNOSIS — Z86718 Personal history of other venous thrombosis and embolism: Secondary | ICD-10-CM | POA: Diagnosis not present

## 2017-02-14 DIAGNOSIS — K819 Cholecystitis, unspecified: Secondary | ICD-10-CM | POA: Diagnosis present

## 2017-02-14 DIAGNOSIS — K828 Other specified diseases of gallbladder: Secondary | ICD-10-CM | POA: Diagnosis not present

## 2017-02-14 DIAGNOSIS — K8012 Calculus of gallbladder with acute and chronic cholecystitis without obstruction: Secondary | ICD-10-CM | POA: Diagnosis not present

## 2017-02-14 DIAGNOSIS — Z886 Allergy status to analgesic agent status: Secondary | ICD-10-CM | POA: Diagnosis not present

## 2017-02-14 DIAGNOSIS — Z9884 Bariatric surgery status: Secondary | ICD-10-CM | POA: Insufficient documentation

## 2017-02-14 DIAGNOSIS — F419 Anxiety disorder, unspecified: Secondary | ICD-10-CM | POA: Diagnosis not present

## 2017-02-14 LAB — PROTIME-INR
INR: 0.91
Prothrombin Time: 12.2 seconds (ref 11.4–15.2)

## 2017-02-14 LAB — URINALYSIS, ROUTINE W REFLEX MICROSCOPIC
Bilirubin Urine: NEGATIVE
GLUCOSE, UA: NEGATIVE mg/dL
Hgb urine dipstick: NEGATIVE
Ketones, ur: NEGATIVE mg/dL
LEUKOCYTES UA: NEGATIVE
Nitrite: NEGATIVE
PH: 6 (ref 5.0–8.0)
PROTEIN: NEGATIVE mg/dL
Specific Gravity, Urine: 1.01 (ref 1.005–1.030)

## 2017-02-14 LAB — CBC
HEMATOCRIT: 26.8 % — AB (ref 36.0–46.0)
Hemoglobin: 8.9 g/dL — ABNORMAL LOW (ref 12.0–15.0)
MCH: 22 pg — ABNORMAL LOW (ref 26.0–34.0)
MCHC: 33.2 g/dL (ref 30.0–36.0)
MCV: 66.3 fL — ABNORMAL LOW (ref 78.0–100.0)
Platelets: 320 10*3/uL (ref 150–400)
RBC: 4.04 MIL/uL (ref 3.87–5.11)
RDW: 21 % — AB (ref 11.5–15.5)
WBC: 7 10*3/uL (ref 4.0–10.5)

## 2017-02-14 LAB — COMPREHENSIVE METABOLIC PANEL
ALBUMIN: 3 g/dL — AB (ref 3.5–5.0)
ALT: 10 U/L — ABNORMAL LOW (ref 14–54)
ANION GAP: 6 (ref 5–15)
AST: 18 U/L (ref 15–41)
Alkaline Phosphatase: 86 U/L (ref 38–126)
BUN: 8 mg/dL (ref 6–20)
CHLORIDE: 103 mmol/L (ref 101–111)
CO2: 30 mmol/L (ref 22–32)
Calcium: 8.6 mg/dL — ABNORMAL LOW (ref 8.9–10.3)
Creatinine, Ser: 0.44 mg/dL (ref 0.44–1.00)
GFR calc Af Amer: >60 mL/min (ref >60)
GFR calc non Af Amer: >60 mL/min (ref >60)
GLUCOSE: 125 mg/dL — AB (ref 65–99)
POTASSIUM: 3.7 mmol/L (ref 3.5–5.1)
SODIUM: 139 mmol/L (ref 135–145)
TOTAL PROTEIN: 5.8 g/dL — AB (ref 6.5–8.1)
Total Bilirubin: 0.3 mg/dL (ref 0.3–1.2)

## 2017-02-14 LAB — RAPID URINE DRUG SCREEN, HOSP PERFORMED
Amphetamines: NOT DETECTED
BARBITURATES: NOT DETECTED
BENZODIAZEPINES: NOT DETECTED
COCAINE: NOT DETECTED
OPIATES: POSITIVE — AB
Tetrahydrocannabinol: NOT DETECTED

## 2017-02-14 LAB — APTT: aPTT: 27 seconds (ref 24–36)

## 2017-02-14 LAB — HCG, QUANTITATIVE, PREGNANCY: hCG, Beta Chain, Quant, S: <1 m[IU]/mL (ref <5)

## 2017-02-14 LAB — LIPASE, BLOOD: LIPASE: 22 U/L (ref 11–51)

## 2017-02-14 MED ORDER — HEPARIN SODIUM (PORCINE) 5000 UNIT/ML IJ SOLN
5000.0000 [IU] | Freq: Three times a day (TID) | INTRAMUSCULAR | Status: AC
  Administered 2017-02-14 (×2): 5000 [IU] via SUBCUTANEOUS
  Filled 2017-02-14 (×2): qty 1

## 2017-02-14 MED ORDER — GABAPENTIN 300 MG PO CAPS
300.0000 mg | ORAL_CAPSULE | Freq: Three times a day (TID) | ORAL | Status: DC
  Administered 2017-02-14 – 2017-02-15 (×4): 300 mg via ORAL
  Filled 2017-02-14 (×4): qty 1

## 2017-02-14 MED ORDER — PANTOPRAZOLE SODIUM 40 MG IV SOLR
40.0000 mg | Freq: Two times a day (BID) | INTRAVENOUS | Status: DC
  Administered 2017-02-14 – 2017-02-15 (×2): 40 mg via INTRAVENOUS
  Filled 2017-02-14 (×2): qty 40

## 2017-02-14 MED ORDER — LORAZEPAM 1 MG PO TABS: 1.0000 mg | ORAL_TABLET | Freq: Four times a day (QID) | ORAL | Status: DC | PRN

## 2017-02-14 MED ORDER — SODIUM CHLORIDE 0.9 % IV SOLN
INTRAVENOUS | Status: DC
  Administered 2017-02-14 – 2017-02-15 (×2): via INTRAVENOUS

## 2017-02-14 MED ORDER — NICOTINE 14 MG/24HR TD PT24
14.0000 mg | MEDICATED_PATCH | Freq: Every day | TRANSDERMAL | Status: DC
  Administered 2017-02-14: 14 mg via TRANSDERMAL
  Filled 2017-02-14 (×2): qty 1

## 2017-02-14 MED ORDER — DEXTROSE 5 % IV SOLN
2.0000 g | INTRAVENOUS | Status: DC
  Administered 2017-02-14: 2 g via INTRAVENOUS
  Filled 2017-02-14 (×2): qty 2

## 2017-02-14 MED ORDER — ONDANSETRON HCL 4 MG/2ML IJ SOLN
4.0000 mg | Freq: Four times a day (QID) | INTRAMUSCULAR | Status: DC | PRN
  Filled 2017-02-14: qty 2

## 2017-02-14 MED ORDER — SUCRALFATE 1 G PO TABS
1.0000 g | ORAL_TABLET | Freq: Three times a day (TID) | ORAL | Status: DC
  Administered 2017-02-14 – 2017-02-16 (×5): 1 g via ORAL
  Filled 2017-02-14 (×7): qty 1

## 2017-02-14 MED ORDER — DIPHENHYDRAMINE HCL 25 MG PO CAPS: 25.0000 mg | ORAL_CAPSULE | Freq: Four times a day (QID) | ORAL | Status: DC | PRN

## 2017-02-14 MED ORDER — THIAMINE HCL 100 MG/ML IJ SOLN: 100.0000 mg | Freq: Every day | INTRAMUSCULAR | Status: DC

## 2017-02-14 MED ORDER — ADULT MULTIVITAMIN W/MINERALS CH: 1.0000 | ORAL_TABLET | Freq: Every day | ORAL | Status: DC

## 2017-02-14 MED ORDER — ACETAMINOPHEN 500 MG PO TABS: 1000.0000 mg | ORAL_TABLET | Freq: Four times a day (QID) | ORAL | Status: DC | PRN

## 2017-02-14 MED ORDER — VITAMIN B-1 100 MG PO TABS
100.0000 mg | ORAL_TABLET | Freq: Every day | ORAL | Status: DC
  Administered 2017-02-14: 100 mg via ORAL
  Filled 2017-02-14: qty 1

## 2017-02-14 MED ORDER — ALBUTEROL SULFATE (2.5 MG/3ML) 0.083% IN NEBU
3.0000 mL | INHALATION_SOLUTION | RESPIRATORY_TRACT | Status: DC | PRN
Start: 2017-02-14 — End: 2017-02-16

## 2017-02-14 MED ORDER — LORAZEPAM 2 MG/ML IJ SOLN
1.0000 mg | Freq: Four times a day (QID) | INTRAMUSCULAR | Status: DC | PRN
  Administered 2017-02-15 – 2017-02-16 (×2): 1 mg via INTRAVENOUS
  Filled 2017-02-14 (×2): qty 1

## 2017-02-14 MED ORDER — DIPHENHYDRAMINE HCL 50 MG/ML IJ SOLN: 25.0000 mg | Freq: Four times a day (QID) | INTRAMUSCULAR | Status: DC | PRN

## 2017-02-14 MED ORDER — FOLIC ACID 1 MG PO TABS
1.0000 mg | ORAL_TABLET | Freq: Every day | ORAL | Status: DC
  Administered 2017-02-14: 1 mg via ORAL
  Filled 2017-02-14: qty 1

## 2017-02-14 MED ORDER — HYDROMORPHONE HCL 1 MG/ML IJ SOLN
0.5000 mg | INTRAMUSCULAR | Status: DC | PRN
  Administered 2017-02-14 – 2017-02-16 (×14): 1 mg via INTRAVENOUS
  Filled 2017-02-14 (×14): qty 1

## 2017-02-14 MED ORDER — ONDANSETRON 4 MG PO TBDP: 4.0000 mg | ORAL_TABLET | Freq: Four times a day (QID) | ORAL | Status: DC | PRN

## 2017-02-14 MED ORDER — DULOXETINE HCL 60 MG PO CPEP
60.0000 mg | ORAL_CAPSULE | Freq: Every day | ORAL | Status: DC
  Administered 2017-02-15: 60 mg via ORAL
  Filled 2017-02-14: qty 1

## 2017-02-14 NOTE — Progress Notes (Signed)
Pt just arriving to the floor, thinks she is having surgery this PM.  Ongoing abdominal pain, nausea and vomiting with PO's for 2-3 months.  Pain worse over the last 3 days.  She is teary and reports hx of ulcers also. Prior w/u shows cholelithiasis, No evidence of gallbladder wall thickening or pericholecystic fluid. No sonographic Murphy sign noted by sonographer.  Common bile duct: Diameter: 8 mm, mildly dilated.  We plan admit, check labs and I will keep her NPO for tonight except ice chips and sips with meds.  Probable surgery tomorrow. Starting PPI and Carafate this PM.  Will also start Rocephin. Nicotine patch and CIWA protocol.     She underwent IVC by her sister on sister on 01/25/17, she was discharged on 01/26/17 from [facility] facility with outpatient  Substance abuse treatment.    Reported heroin use QOD, prior hx of OD, and cutting behavior.   Pt reports she has chronic back pain and takes some occasional OxyContin for pain.  She also reports being on Meds for chronic pain, anxiety and depression.  Abdominal ultrasound is from 08/1916.    Abdominal pain with cholelithiasis Hx of Heroin OD Chronic back pain Anxiety/depression Tobacco use x 27 years Hypertension   Plan:  Admit, labs, and new [place] tonight.   Labs to include CBC, CMP, lipase, Drug screen, UA, HIV, Pregnancy screen.  Repeat CBC/CMP in AM.  Rocephin, Protonix, Carafate, home meds, nicotine patch to start this PM.

## 2017-02-14 NOTE — H&P (Signed)
Terri [name] 02/14/2017 11:49 AM Location: [facility] Patient #: [id number] DOB: [birth year] Divorced / Language: Cleophus Molt / Race: White Female  History of Present Illness Marcello Moores A. Karalynn Cottone MD; 02/14/2017 12:20 PM) Patient words: ABDOMINAL PAIN    Pt sent at the request of Dr Jackson Latino for a 3 month history of epigastric and RUQ abdominal pain. The patient is teary eyed and has pain the last 3 days which is worse. Location is the epigastrium and severe and sharp. Not able to keep much down as well with nausea and vomiting. U/S done in april showed gallstones. Much more tender over the weekend. Pt has had a gastric bypass and continue to smoke heavily. She has a history of narcotic abuse as well.      CLINICAL DATA: Epigastric pain for 3 months.  EXAM: ABDOMEN ULTRASOUND COMPLETE  COMPARISON: None.  FINDINGS: Gallbladder: A few tiny less than 1 cm gallstones are seen. No evidence of gallbladder wall thickening or pericholecystic fluid. No sonographic Murphy sign noted by sonographer.  Common bile duct: Diameter: 8 mm, mildly dilated.  Liver: No focal lesion identified. Within normal limits in parenchymal echogenicity.  IVC: No abnormality visualized.  Pancreas: Visualized portion unremarkable.  Spleen: Size and appearance within normal limits.  Right Kidney: Length: 11.1 cm. Echogenicity within normal limits. No mass or hydronephrosis visualized.  Left Kidney: Length: 12.7 cm. Echogenicity within normal limits. No mass or hydronephrosis visualized.  Abdominal aorta: No aneurysm visualized.  Other findings: None.  IMPRESSION: Tiny gallstones. No sonographic signs of acute cholecystitis.  Mild biliary ductal dilatation, with common bile duct measuring 8 mm. Recommend correlation with liver function tests, and consider abdomen MRI and MRCP without and with contrast if clinically warranted.   Electronically Signed By: Earle Gell M.D. On:  09/09/2016 15:34.  The patient is a 41 year old female.   Past Surgical History Malachy Moan, [place]; 02/14/2017 11:49 AM) Cesarean Section - Multiple Gastric Bypass Oral Surgery Spinal Surgery - Lower Back  Diagnostic Studies History Malachy Moan, [place]; 02/14/2017 11:49 AM) Colonoscopy 5-10 years ago Mammogram never Pap Smear >5 years ago  Allergies Malachy Moan, RMA; 02/14/2017 11:52 AM) Ketorolac Tromethamine *CHEMICALS* Methocarbamol *MUSCULOSKELETAL THERAPY AGENTS* Diarrhea. Allergies Reconciled  Medication History Malachy Moan, RMA; 02/14/2017 11:53 AM) Oxycodone-Acetaminophen (5-325MG  Tablet, Oral) Active. DULoxetine HCl (60MG  Capsule DR Part, Oral) Active. Lisinopril-Hydrochlorothiazide (10-12.5MG  Tablet, Oral) Active. Ondansetron (8MG  Tablet Disint, Oral) Active. Amitriptyline HCl (50MG  Tablet, Oral) Active. Medications Reconciled  Social History Malachy Moan, [place]; 02/14/2017 11:49 AM) Alcohol use Occasional alcohol use. Caffeine use Carbonated beverages, Coffee, Tea. No drug use Tobacco use Current every day smoker.  Family History Malachy Moan, [place]; 02/14/2017 11:49 AM) Alcohol Abuse Family Members In General, Father, Sister. Arthritis Family Members In General. Bleeding disorder Family Members In General. Cerebrovascular Accident Family Members In General. Depression Daughter, Family Members In General, Mother, Sister. Diabetes Mellitus Family Members In General. Hypertension Daughter, Family Members In General, Father, Mother, Sister. Migraine Headache Daughter, Mother. Respiratory Condition Family Members In General, Father, Mother.  Pregnancy / Birth History Malachy Moan, [place]; 02/14/2017 11:49 AM) Age at menarche 17 years. Contraceptive History Oral contraceptives. Gravida 3 Maternal age 62-25 Para 2 Regular periods  Other Problems Malachy Moan, [place]; 02/14/2017 11:49 AM) Anxiety  Disorder Back Pain Cholelithiasis Depression Gastric Ulcer Gastroesophageal Reflux Disease High blood pressure Migraine Headache Pulmonary Embolism / Blood Clot in Legs     Review of Systems Malachy Moan RMA; 02/14/2017 11:49 AM) General Present- Appetite Loss and  Weight Loss. Not Present- Chills, Fatigue, Fever, Night Sweats and Weight Gain. Skin Present- Dryness. Not Present- Change in Wart/Mole, Hives, Jaundice, New Lesions, Non-Healing Wounds, Rash and Ulcer. HEENT Present- Seasonal Allergies. Not Present- Earache, Hearing Loss, Hoarseness, Nose Bleed, Oral Ulcers, Ringing in the Ears, Sinus Pain, Sore Throat, Visual Disturbances, Wears glasses/contact lenses and Yellow Eyes. Cardiovascular Present- Swelling of Extremities. Not Present- Chest Pain, Difficulty Breathing Lying Down, Leg Cramps, Palpitations, Rapid Heart Rate and Shortness of Breath. Gastrointestinal Present- Abdominal Pain, Nausea and Vomiting. Not Present- Bloating, Bloody Stool, Change in Bowel Habits, Chronic diarrhea, Constipation, Difficulty Swallowing, Excessive gas, Gets full quickly at meals, Hemorrhoids, Indigestion and Rectal Pain. Female Genitourinary Not Present- Frequency, Nocturia, Painful Urination, Pelvic Pain and Urgency. Musculoskeletal Present- Back Pain, Joint Pain, Joint Stiffness and Swelling of Extremities. Not Present- Muscle Pain and Muscle Weakness. Neurological Present- Headaches, Numbness, Tingling and Weakness. Not Present- Decreased Memory, Fainting, Seizures, Tremor and Trouble walking. Psychiatric Present- Anxiety, Bipolar, Depression and Frequent crying. Not Present- Change in Sleep Pattern and Fearful. Endocrine Present- Cold Intolerance, Heat Intolerance and Hot flashes. Not Present- Excessive Hunger, Hair Changes and New Diabetes. Hematology Not Present- Blood Thinners, Easy Bruising, Excessive bleeding, Gland problems, HIV and Persistent Infections.  Vitals Malachy Moan RMA; 02/14/2017 11:53 AM) 02/14/2017 11:50 AM Weight: 197.4 lb Height: 64in Body Surface Area: 1.95 m Body Mass Index: 33.88 kg/m  Temp.: 97.42F  Pulse: 86 (Regular)  BP: 140/100 (Sitting, Left Arm, Standard)      Physical Exam (Eriyonna Matsushita A. Kealii Thueson MD; 02/14/2017 12:23 PM)  General Mental Status-Alert. General Appearance-Consistent with stated age. Hydration-Well hydrated. Voice-Normal.  Head and Neck Head-normocephalic, atraumatic with no lesions or palpable masses.  Chest and Lung Exam Chest and lung exam reveals -quiet, even and easy respiratory effort with no use of accessory muscles and on auscultation, normal breath sounds, no adventitious sounds and normal vocal resonance. Inspection Chest Wall - Normal. Back - normal.  Cardiovascular Cardiovascular examination reveals -on palpation PMI is normal in location and amplitude, no palpable S3 or S4. Normal cardiac borders., normal heart sounds, regular rate and rhythm with no murmurs, carotid auscultation reveals no bruits and normal pedal pulses bilaterally.  Abdomen Note: teneder RUQ positive Murphy's sign   MIDLINE SCAR NOTED soft ND  Musculoskeletal Note: walks with a cane    Assessment & Plan (Dannae Kato A. Amelio Brosky MD; 02/14/2017 12:22 PM)  ACUTE CHOLECYSTITIS WITH CHRONIC CHOLECYSTITIS (K81.2) Impression: NEEDS ADMISSION TO HOSPITAL FOR CHOLECYSTECTOMY  DISCUSSED CARE WITH THE PATIENT AND FAMILY   discussed with on call MD at [facility] and PA

## 2017-02-14 NOTE — H&P (Deleted)
  The note originally documented on this encounter has been moved the the encounter in which it belongs.  

## 2017-02-15 ENCOUNTER — Observation Stay (HOSPITAL_COMMUNITY): Payer: Medicare Other | Admitting: Registered Nurse

## 2017-02-15 ENCOUNTER — Encounter (HOSPITAL_COMMUNITY): Payer: Self-pay | Admitting: Registered Nurse

## 2017-02-15 ENCOUNTER — Encounter (HOSPITAL_COMMUNITY): Admission: AD | Disposition: A | Discharge status: Home / Self Care | Payer: Self-pay | Source: Ambulatory Visit

## 2017-02-15 DIAGNOSIS — F17200 Nicotine dependence, unspecified, uncomplicated: Secondary | ICD-10-CM | POA: Diagnosis not present

## 2017-02-15 DIAGNOSIS — K8012 Calculus of gallbladder with acute and chronic cholecystitis without obstruction: Secondary | ICD-10-CM | POA: Diagnosis not present

## 2017-02-15 DIAGNOSIS — F419 Anxiety disorder, unspecified: Secondary | ICD-10-CM | POA: Diagnosis not present

## 2017-02-15 DIAGNOSIS — K828 Other specified diseases of gallbladder: Secondary | ICD-10-CM | POA: Diagnosis not present

## 2017-02-15 HISTORY — PX: LAPAROSCOPIC LYSIS OF ADHESIONS: SHX5905

## 2017-02-15 HISTORY — PX: CHOLECYSTECTOMY: SHX55

## 2017-02-15 HISTORY — PX: UPPER GI ENDOSCOPY: SHX6162

## 2017-02-15 LAB — CBC
HEMATOCRIT: 26.2 % — AB (ref 36.0–46.0)
Hemoglobin: 8.7 g/dL — ABNORMAL LOW (ref 12.0–15.0)
MCH: 21.6 pg — ABNORMAL LOW (ref 26.0–34.0)
MCHC: 33.2 g/dL (ref 30.0–36.0)
MCV: 65.2 fL — AB (ref 78.0–100.0)
Platelets: 301 10*3/uL (ref 150–400)
RBC: 4.02 MIL/uL (ref 3.87–5.11)
RDW: 21 % — AB (ref 11.5–15.5)
WBC: 5.5 10*3/uL (ref 4.0–10.5)

## 2017-02-15 LAB — COMPREHENSIVE METABOLIC PANEL
ALBUMIN: 2.7 g/dL — AB (ref 3.5–5.0)
ALT: 9 U/L — ABNORMAL LOW (ref 14–54)
AST: 17 U/L (ref 15–41)
Alkaline Phosphatase: 79 U/L (ref 38–126)
Anion gap: 6 (ref 5–15)
BILIRUBIN TOTAL: 0.2 mg/dL — AB (ref 0.3–1.2)
BUN: 7 mg/dL (ref 6–20)
CO2: 27 mmol/L (ref 22–32)
CREATININE: 0.47 mg/dL (ref 0.44–1.00)
Calcium: 8.3 mg/dL — ABNORMAL LOW (ref 8.9–10.3)
Chloride: 106 mmol/L (ref 101–111)
GFR calc Af Amer: >60 mL/min (ref >60)
GLUCOSE: 105 mg/dL — AB (ref 65–99)
POTASSIUM: 4.1 mmol/L (ref 3.5–5.1)
Sodium: 139 mmol/L (ref 135–145)
TOTAL PROTEIN: 5.4 g/dL — AB (ref 6.5–8.1)

## 2017-02-15 LAB — SURGICAL PCR SCREEN
MRSA, PCR: NEGATIVE
STAPHYLOCOCCUS AUREUS: POSITIVE — AB

## 2017-02-15 LAB — HIV ANTIBODY (ROUTINE TESTING W REFLEX): HIV Screen 4th Generation wRfx: NONREACTIVE

## 2017-02-15 SURGERY — LAPAROSCOPIC CHOLECYSTECTOMY
Anesthesia: General | Site: Abdomen

## 2017-02-15 MED ORDER — SUGAMMADEX SODIUM 200 MG/2ML IV SOLN
INTRAVENOUS | Status: DC | PRN
  Administered 2017-02-15: 200 mg via INTRAVENOUS

## 2017-02-15 MED ORDER — MIDAZOLAM HCL 5 MG/5ML IJ SOLN
INTRAMUSCULAR | Status: DC | PRN
  Administered 2017-02-15: 2 mg via INTRAVENOUS

## 2017-02-15 MED ORDER — GLYCOPYRROLATE 0.2 MG/ML IV SOSY
PREFILLED_SYRINGE | INTRAVENOUS | Status: AC
  Filled 2017-02-15: qty 5

## 2017-02-15 MED ORDER — IOPAMIDOL (ISOVUE-300) INJECTION 61%
INTRAVENOUS | Status: AC
  Filled 2017-02-15: qty 50

## 2017-02-15 MED ORDER — PROPOFOL 10 MG/ML IV BOLUS
INTRAVENOUS | Status: AC
  Filled 2017-02-15: qty 20

## 2017-02-15 MED ORDER — HYDROMORPHONE HCL-NACL 0.5-0.9 MG/ML-% IV SOSY
0.2500 mg | PREFILLED_SYRINGE | INTRAVENOUS | Status: DC | PRN
  Administered 2017-02-15 (×4): 0.5 mg via INTRAVENOUS

## 2017-02-15 MED ORDER — FENTANYL CITRATE (PF) 250 MCG/5ML IJ SOLN
INTRAMUSCULAR | Status: AC
  Filled 2017-02-15: qty 5

## 2017-02-15 MED ORDER — ROCURONIUM BROMIDE 10 MG/ML (PF) SYRINGE
PREFILLED_SYRINGE | INTRAVENOUS | Status: DC | PRN
  Administered 2017-02-15: 50 mg via INTRAVENOUS
  Administered 2017-02-15: 20 mg via INTRAVENOUS

## 2017-02-15 MED ORDER — BUPIVACAINE-EPINEPHRINE 0.25% -1:200000 IJ SOLN
INTRAMUSCULAR | Status: DC | PRN
  Administered 2017-02-15: 30 mL

## 2017-02-15 MED ORDER — FENTANYL CITRATE (PF) 250 MCG/5ML IJ SOLN
INTRAMUSCULAR | Status: DC | PRN
  Administered 2017-02-15 (×2): 50 ug via INTRAVENOUS
  Administered 2017-02-15: 100 ug via INTRAVENOUS
  Administered 2017-02-15: 50 ug via INTRAVENOUS

## 2017-02-15 MED ORDER — SUCCINYLCHOLINE CHLORIDE 200 MG/10ML IV SOSY
PREFILLED_SYRINGE | INTRAVENOUS | Status: AC
  Filled 2017-02-15: qty 10

## 2017-02-15 MED ORDER — LIDOCAINE 2% (20 MG/ML) 5 ML SYRINGE
INTRAMUSCULAR | Status: AC
  Filled 2017-02-15: qty 5

## 2017-02-15 MED ORDER — MIDAZOLAM HCL 2 MG/2ML IJ SOLN
INTRAMUSCULAR | Status: AC
  Filled 2017-02-15: qty 2

## 2017-02-15 MED ORDER — ONDANSETRON HCL 4 MG/2ML IJ SOLN
INTRAMUSCULAR | Status: DC | PRN
  Administered 2017-02-15: 4 mg via INTRAVENOUS

## 2017-02-15 MED ORDER — ONDANSETRON HCL 4 MG/2ML IJ SOLN
INTRAMUSCULAR | Status: AC
  Filled 2017-02-15: qty 2

## 2017-02-15 MED ORDER — LIDOCAINE 2% (20 MG/ML) 5 ML SYRINGE
INTRAMUSCULAR | Status: DC | PRN
  Administered 2017-02-15: 60 mg via INTRAVENOUS

## 2017-02-15 MED ORDER — METHOCARBAMOL 1000 MG/10ML IJ SOLN
500.0000 mg | Freq: Three times a day (TID) | INTRAVENOUS | Status: DC | PRN
  Administered 2017-02-16: 500 mg via INTRAVENOUS
  Filled 2017-02-15: qty 550
  Filled 2017-02-15: qty 5

## 2017-02-15 MED ORDER — DEXAMETHASONE SODIUM PHOSPHATE 10 MG/ML IJ SOLN
INTRAMUSCULAR | Status: AC
  Filled 2017-02-15: qty 1

## 2017-02-15 MED ORDER — HYDROMORPHONE HCL-NACL 0.5-0.9 MG/ML-% IV SOSY
PREFILLED_SYRINGE | INTRAVENOUS | Status: AC
  Administered 2017-02-15: 0.5 mg via INTRAVENOUS
  Filled 2017-02-15: qty 4

## 2017-02-15 MED ORDER — GLYCOPYRROLATE 0.2 MG/ML IJ SOLN
INTRAMUSCULAR | Status: DC | PRN
  Administered 2017-02-15: 0.2 mg via INTRAVENOUS

## 2017-02-15 MED ORDER — PROPOFOL 10 MG/ML IV BOLUS
INTRAVENOUS | Status: DC | PRN
  Administered 2017-02-15: 200 mg via INTRAVENOUS

## 2017-02-15 MED ORDER — LACTATED RINGERS IR SOLN
Status: DC | PRN
  Administered 2017-02-15: 1000 mL

## 2017-02-15 MED ORDER — PROMETHAZINE HCL 25 MG/ML IJ SOLN: 6.2500 mg | INTRAMUSCULAR | Status: DC | PRN

## 2017-02-15 MED ORDER — DEXAMETHASONE SODIUM PHOSPHATE 10 MG/ML IJ SOLN
INTRAMUSCULAR | Status: DC | PRN
  Administered 2017-02-15: 10 mg via INTRAVENOUS

## 2017-02-15 MED ORDER — ROCURONIUM BROMIDE 50 MG/5ML IV SOSY
PREFILLED_SYRINGE | INTRAVENOUS | Status: AC
  Filled 2017-02-15: qty 5

## 2017-02-15 MED ORDER — LACTATED RINGERS IV SOLN
INTRAVENOUS | Status: DC | PRN
  Administered 2017-02-15 (×2): via INTRAVENOUS

## 2017-02-15 MED ORDER — BUPIVACAINE-EPINEPHRINE (PF) 0.25% -1:200000 IJ SOLN
INTRAMUSCULAR | Status: AC
  Filled 2017-02-15: qty 30

## 2017-02-15 MED ORDER — HEPARIN SODIUM (PORCINE) 5000 UNIT/ML IJ SOLN: 5000.0000 [IU] | Freq: Three times a day (TID) | INTRAMUSCULAR | Status: DC

## 2017-02-15 MED ORDER — SUGAMMADEX SODIUM 200 MG/2ML IV SOLN
INTRAVENOUS | Status: AC
  Filled 2017-02-15: qty 2

## 2017-02-15 SURGICAL SUPPLY — 37 items
ADH SKN CLS APL DERMABOND .7 (GAUZE/BANDAGES/DRESSINGS) ×2
APPLIER CLIP ROT 10 11.4 M/L (STAPLE) ×4
APR CLP MED LRG 11.4X10 (STAPLE) ×2
BAG SPEC RTRVL LRG 6X4 10 (ENDOMECHANICALS) ×2
CABLE HIGH FREQUENCY MONO STRZ (ELECTRODE) ×4 IMPLANT
CHLORAPREP W/TINT 26ML (MISCELLANEOUS) ×4 IMPLANT
CLIP APPLIE ROT 10 11.4 M/L (STAPLE) ×2 IMPLANT
COVER MAYO STAND STRL (DRAPES) IMPLANT
COVER SURGICAL LIGHT HANDLE (MISCELLANEOUS) ×4 IMPLANT
DECANTER SPIKE VIAL GLASS SM (MISCELLANEOUS) ×2 IMPLANT
DERMABOND ADVANCED (GAUZE/BANDAGES/DRESSINGS) ×2
DERMABOND ADVANCED .7 DNX12 (GAUZE/BANDAGES/DRESSINGS) ×2 IMPLANT
DRAPE C-ARM 42X120 X-RAY (DRAPES) IMPLANT
ELECT REM PT RETURN 15FT ADLT (MISCELLANEOUS) ×4 IMPLANT
GLOVE BIO SURGEON STRL SZ 6 (GLOVE) ×4 IMPLANT
GLOVE INDICATOR 6.5 STRL GRN (GLOVE) ×4 IMPLANT
GOWN STRL REUS W/TWL LRG LVL3 (GOWN DISPOSABLE) ×4 IMPLANT
GOWN STRL REUS W/TWL XL LVL3 (GOWN DISPOSABLE) ×8 IMPLANT
GRASPER SUT TROCAR 14GX15 (MISCELLANEOUS) ×4 IMPLANT
HEMOSTAT SNOW SURGICEL 2X4 (HEMOSTASIS) IMPLANT
IRRIG SUCT STRYKERFLOW 2 WTIP (MISCELLANEOUS)
IRRIGATION SUCT STRKRFLW 2 WTP (MISCELLANEOUS) ×2 IMPLANT
KIT BASIN OR (CUSTOM PROCEDURE TRAY) ×4 IMPLANT
NDL INSUFFLATION 14GA 120MM (NEEDLE) ×2 IMPLANT
NEEDLE INSUFFLATION 14GA 120MM (NEEDLE) ×4 IMPLANT
POUCH SPECIMEN RETRIEVAL 10MM (ENDOMECHANICALS) ×4 IMPLANT
SCISSORS LAP 5X35 DISP (ENDOMECHANICALS) ×4 IMPLANT
SET CHOLANGIOGRAPH MIX (MISCELLANEOUS) IMPLANT
SET IRRIG TUBING LAPAROSCOPIC (IRRIGATION / IRRIGATOR) ×2 IMPLANT
SLEEVE XCEL OPT CAN 5 100 (ENDOMECHANICALS) ×10 IMPLANT
SUT MNCRL AB 4-0 PS2 18 (SUTURE) ×4 IMPLANT
TOWEL OR 17X26 10 PK STRL BLUE (TOWEL DISPOSABLE) ×4 IMPLANT
TOWEL OR NON WOVEN STRL DISP B (DISPOSABLE) IMPLANT
TRAY LAPAROSCOPIC (CUSTOM PROCEDURE TRAY) ×4 IMPLANT
TROCAR BLADELESS OPT 5 100 (ENDOMECHANICALS) ×4 IMPLANT
TROCAR XCEL 12X100 BLDLESS (ENDOMECHANICALS) ×4 IMPLANT
TUBING INSUF HEATED (TUBING) ×4 IMPLANT

## 2017-02-15 NOTE — Op Note (Signed)
Operative Note  Terri [name] 41 y.o. female [id number]  02/15/2017  Surgeon: Clovis Riley MD  Assistant: Margie Billet PA-C  Procedure performed: Laparoscopic Cholecystectomy, laparoscopic lysis of adhesions x 60 minutes, upper endoscopy  Preop diagnosis: biliary colic, prior marginal ulcers in a 41yo smoker 6 years s/p open gastric bypass Post-op diagnosis/intraop findings: Significant intraabdominal adhesive disease. No obvious active ulcer but a small opening too narrow to traverse was present just proximal to the gastrojejunostomy, chronic inflammation of gallbladder  Specimens: gallbladder  EBL: 51OA  Complications: none  Description of procedure: After obtaining informed consent the patient was brought to the operating room. Prophylactic antibiotics and subcutaneous heparin were administered. SCD's were applied. General endotracheal anesthesia was initiated and a formal time-out was performed. The abdomen was prepped and draped in the usual sterile fashion and the abdomen was entered using visiport technique in the left upper quadrant after instilling the site with local. Insufflation to 38mmHg was obtained and gross inspection revealed no evidence of injury, however she does have significant and dense adhesive disease most prominent in the upper abdomen but also throughout the middle and lower abdomen. We were able to navigate through the adhesions to find a bare area just above the umbilicus for a 38mm camera port which was inserted under direct visualization. The area over the liver was also free of adhesions and therefore 2 5 mm trochars were placed here in the anterior axillary and midclavicular lines again under direct visualization. Following this adhesions to the anterior abdominal wall were taken down bluntly and sharply to confirm that there had been no trocar injury from our left upper quadrant entry. Additionally there was an [place] of omental adhesion in the lower abdomen and  this was taken down to prevent future bowel obstruction. Once satisfied that there had been no injury from our trochars, we stopped the adhesiolysis. There remains very dense adhesions obscuring any view of her bypass anatomy. An 76mm trocar was placed in the epigastrium to the patient's right of the falciform. The gallbladder was retracted cephalad and the infundibulum was retracted laterally. A combination of hook electrocautery and blunt dissection was utilized to clear the peritoneum from the neck and cystic duct, circumferentially isolating the cystic artery and cystic duct and lifting the gallbladder from the cystic plate. The critical view of safety was achieved with the cystic artery, cystic duct, and liver bed visualized between them with no other structures. Additionally we were easily able to visualize the common bile duct. The cystic artery was clipped with a single clip proximally and distally and divided as was the cystic duct with two clips on the proximal end. The gallbladder was dissected from the liver plate using electrocautery. Once freed the gallbladder was placed in an endocatch bag and removed intact through the epigastric trocar site. A small amount of bleeding on the liver bed was controlled with cautery. The right upper quadrant was irrigated and the effluent was clear. Hemostasis was once again confirmed, and reinspection of the abdomen revealed no injuries. The clips were well opposed without any bile leak from the duct or the liver bed. We reinspected the areas of adhesio lysis to confirm hemostasis and no other abnormalities. The 93mm trocar site in the epigastrium was closed with a 0 vicryl in the fascia under direct visualization using a PMI device. The abdomen was desufflated and all trocars removed. The skin incisions were closed with running subcuticular monocryl and Dermabond.  I then performed an upper endoscopy.  The endoscope was placed in the oropharynx and directed into the  esophagus under direct visualization and advanced to the GE junction which was found and a proximally 45 cm. The gastric pouch was appropriate A small and once insufflated I did not see any evidence of active ulcer at this time. There is however visible suture material on one aspect of her gastrojejunostomy. The gastrojejunostomy appeared widely patent and was easily traversed with the scope. Just proximal to it there was a small opening which was too small to traverse or see-through, possible diverticulum versus fistula of some kind. The pouch was desufflated and the endoscope removed. The patient was awakened, extubated and transported to the recovery room in stable condition.   All counts were correct at the completion of the case.

## 2017-02-15 NOTE — Transfer of Care (Signed)
Immediate Anesthesia Transfer of Care Note  Patient: Terri [name]  Procedure(s) Performed: Procedure(s): LAPAROSCOPIC CHOLECYSTECTOMY (N/A) UPPER GI ENDOSCOPY (N/A) LAPAROSCOPIC LYSIS OF ADHESIONS  Patient Location: PACU  Anesthesia Type:General  Level of Consciousness:  sedated, patient cooperative and responds to stimulation  Airway & Oxygen Therapy:Patient Spontanous Breathing and Patient connected to face mask oxgen  Post-op Assessment:  Report given to PACU RN and Post -op Vital signs reviewed and stable  Post vital signs:  Reviewed and stable  Last Vitals:  Vitals:   02/14/17 2052 02/15/17 0427  BP: 118/90 (!) 140/94  Pulse: 82 67  Resp: 18 16  Temp: 37.2 C 37.2 C  SpO2: 712% 78%    Complications: No apparent anesthesia complications

## 2017-02-15 NOTE — Anesthesia Procedure Notes (Signed)
Procedure Name: Intubation Date/Time: 02/15/2017 1:11 PM Performed by: Talbot Grumbling Pre-anesthesia Checklist: Emergency Drugs available, Suction available, Patient identified and Patient being monitored Patient Re-evaluated:Patient Re-evaluated prior to induction Oxygen Delivery Method: Circle system utilized Preoxygenation: Pre-oxygenation with 100% oxygen Induction Type: IV induction Ventilation: Mask ventilation without difficulty Laryngoscope Size: Mac and 3 Grade View: Grade I Tube type: Oral Tube size: 7.5 mm Number of attempts: 1 Maryann Alar, EMT) Airway Equipment and Method: Stylet Placement Confirmation: ETT inserted through vocal cords under direct vision,  positive ETCO2 and breath sounds checked- equal and bilateral Secured at: 21 cm Tube secured with: Tape Dental Injury: Teeth and Oropharynx as per pre-operative assessment

## 2017-02-15 NOTE — Progress Notes (Signed)
Day of Surgery   Subjective/Chief Complaint: Continues to have pain.    Objective: Vital signs in last 24 hours: Temp:  [98.9 F (37.2 C)-99.1 F (37.3 C)] 98.9 F (37.2 C) (09/25 0427) Pulse Rate:  [67-82] 67 (09/25 0427) Resp:  [16-18] 16 (09/25 0427) BP: (118-140)/(88-94) 140/94 (09/25 0427) SpO2:  [99 %-100 %] 99 % (09/25 0427) Weight:  [88 kg (194 lb)] 88 kg (194 lb) (09/24 1446) Last BM Date: 02/14/17  Intake/Output from previous day: 09/24 0701 - 09/25 0700 In: 1375 [I.V.:1375] Out: 300 [Urine:300] Intake/Output this shift: Total I/O In: -  Out: 200 [Urine:200]  General appearance: alert and cooperative Resp: clear to auscultation bilaterally GI: soft, diffusely tender with voluntary guarding. Well healed upper midline incision.  Skin: warm and dry. several ulcers on lower abdominal wall from picking.   Lab Results:   Recent Labs  02/14/17 1637  WBC 7.0  HGB 8.9*  HCT 26.8*  PLT 320   BMET  Recent Labs  02/14/17 1637  NA 139  K 3.7  CL 103  CO2 30  GLUCOSE 125*  BUN 8  CREATININE 0.44  CALCIUM 8.6*   PT/INR  Recent Labs  02/14/17 1637  LABPROT 12.2  INR 0.91   ABG No results for input(s): PHART, HCO3 in the last 72 hours.  Invalid input(s): PCO2, PO2  Studies/Results: US Abdomen Limited Ruq  Result Date: 02/14/2017 CLINICAL DATA:  Cholecystitis. EXAM: ULTRASOUND ABDOMEN LIMITED RIGHT UPPER QUADRANT COMPARISON:  09/09/2016 FINDINGS: Gallbladder: Similar to prior, there appears to be tiny layering nonshadowing stones, likely incorporated into dependent sludge. Gallbladder wall thickness measures up to 2.4 mm. Sonographer reports no sonographic Murphy sign. Common bile duct: Diameter: Upper normal diameter at 4-6 mm. Liver: No focal lesion identified. Within normal limits in parenchymal echogenicity. Portal vein is patent on color Doppler imaging with normal direction of blood flow towards the liver. IMPRESSION: Gallstones with borderline  gallbladder wall thickening. Sonographer reports no sonographic Murphy sign. If clinical picture is equivocal for cholecystitis, nuclear scintigraphy may prove helpful to further evaluate. Electronically Signed   By: Misty Stanley M.D.   On: 02/14/2017 20:12    Anti-infectives: Anti-infectives    Start     Dose/Rate Route Frequency Ordered Stop   02/14/17 1600  cefTRIAXone (ROCEPHIN) 2 g in dextrose 5 % 50 mL IVPB     2 g 100 mL/hr over 30 Minutes Intravenous Every 24 hours 02/14/17 1535        Assessment/Plan: s/p Procedure(s): LAPAROSCOPIC CHOLECYSTECTOMY (N/A) UPPER GI ENDOSCOPY (N/A) She is a gastric bypass patient with known ulcers who continues to smoke. I suspect her symptoms are more likely related to ulcer disease than her gallbladder, but with stones and tenderness merits cholecystectomy. Will perform upper endoscopy as well. Discussed risks of surgery including bleeding, pain, scarring, intraabdominal injury or ileus if adhesiolysis is required, common bile duct injury and sequelae, conversion to open surgery. Advised her that she may have ongoing symptoms if this turns out to be ulcer related. Strongly advised her that her quality of life/ ulcers will NOT improve until she stops smoking.   LOS: 1 day    Clovis Riley 02/15/2017

## 2017-02-15 NOTE — Anesthesia Preprocedure Evaluation (Addendum)
Anesthesia Evaluation  Patient identified by MRN, date of birth, ID band Patient awake    Reviewed: Allergy & Precautions, NPO status , Patient's Chart, lab work & pertinent test results  Airway Mallampati: II  TM Distance: >3 FB Neck ROM: Full    Dental  (+) Dental Advisory Given, Poor Dentition, Chipped, Missing   Pulmonary Current Smoker,    Pulmonary exam normal breath sounds clear to auscultation       Cardiovascular hypertension, Normal cardiovascular exam Rhythm:Regular Rate:Normal     Neuro/Psych Bipolar Disorder Chronic narcotic use negative neurological ROS     GI/Hepatic negative GI ROS, Neg liver ROS,   Endo/Other  negative endocrine ROS  Renal/GU negative Renal ROS  negative genitourinary   Musculoskeletal negative musculoskeletal ROS (+)   Abdominal   Peds negative pediatric ROS (+)  Hematology negative hematology ROS (+)   Anesthesia Other Findings   Reproductive/Obstetrics negative OB ROS                            Anesthesia Physical Anesthesia Plan  ASA: III  Anesthesia Plan: General   Post-op Pain Management:    Induction: Intravenous  PONV Risk Score and Plan: 2 and Ondansetron, Dexamethasone, Treatment may vary due to age or medical condition and Midazolam  Airway Management Planned: Oral ETT  Additional Equipment:   Intra-op Plan:   Post-operative Plan: Extubation in OR  Informed Consent: I have reviewed the patients History and Physical, chart, labs and discussed the procedure including the risks, benefits and alternatives for the proposed anesthesia with the patient or authorized representative who has indicated his/her understanding and acceptance.   Dental advisory given  Plan Discussed with: CRNA and Surgeon  Anesthesia Plan Comments:         Anesthesia Quick Evaluation

## 2017-02-15 NOTE — Discharge Instructions (Addendum)
Please arrive at least 30 min before your appointment to complete your check in paperwork.  If you are unable to arrive 30 min prior to your appointment time we may have to cancel or reschedule you. ° °LAPAROSCOPIC SURGERY: POST OP INSTRUCTIONS  °1. DIET: Follow a light bland diet the first 24 hours after arrival home, such as soup, liquids, crackers, etc. Be sure to include lots of fluids daily. Avoid fast food or heavy meals as your are more likely to get nauseated. Eat a low fat the next few days after surgery.  °2. Take your usually prescribed home medications unless otherwise directed. °3. PAIN CONTROL:  °1. Pain is best controlled by a usual combination of three different methods TOGETHER:  °1. Ice/Heat °2. Over the counter pain medication °3. Prescription pain medication °2. Most patients will experience some swelling and bruising around the incisions. Ice packs or heating pads (30-60 minutes up to 6 times a day) will help. Use ice for the first few days to help decrease swelling and bruising, then switch to heat to help relax tight/sore spots and speed recovery. Some people prefer to use ice alone, heat alone, alternating between ice & heat. Experiment to what works for you. Swelling and bruising can take several weeks to resolve.  °3. It is helpful to take an over-the-counter pain medication regularly for the first few weeks. Choose one of the following that works best for you:  °1. Naproxen (Aleve, etc) Two 220mg tabs twice a day °2. Ibuprofen (Advil, etc) Three 200mg tabs four times a day (every meal & bedtime) °3. Acetaminophen (Tylenol, etc) 500-650mg four times a day (every meal & bedtime) °4. A prescription for pain medication (such as oxycodone, hydrocodone, etc) should be given to you upon discharge. Take your pain medication as prescribed.  °1. If you are having problems/concerns with the prescription medicine (does not control pain, nausea, vomiting, rash, itching, etc), please call us [phone] to see if we need to switch you to a different pain medicine that will work better for you and/or control your side effect better. °2. If you need a refill on your pain medication, please contact your pharmacy. They will contact our office to request authorization. Prescriptions will not be filled after 5 pm or on week-ends. °4. Avoid getting constipated. Between the surgery and the pain medications, it is common to experience some constipation. Increasing fluid intake and taking a fiber supplement (such as Metamucil, Citrucel, FiberCon, MiraLax, etc) 1-2 times a day regularly will usually help prevent this problem from occurring. A mild laxative (prune juice, Milk of Magnesia, MiraLax, etc) should be taken according to package directions if there are no bowel movements after 48 hours.  °5. Watch out for diarrhea. If you have many loose bowel movements, simplify your diet to bland foods & liquids for a few days. Stop any stool softeners and decrease your fiber supplement. Switching to mild anti-diarrheal medications (Kayopectate, Pepto Bismol) can help. If this worsens or does not improve, please call us. °6. Wash / shower every day. You may shower over the dressings as they are waterproof. Continue to shower over incision(s) after the dressing is off. If there is glue over the incisions try not to pick it off, let it fall off naturally. °7. Remove your waterproof bandages 2 days after surgery. You may leave the incision open to air. You may replace a dressing/Band-Aid to cover the incision for comfort if you wish.  °8. ACTIVITIES as tolerated:  °  1. You may resume regular (light) daily activities beginning the next day--such as daily self-care, walking, climbing stairs--gradually increasing activities as tolerated. If you can walk 30 minutes without difficulty, it is safe to try more intense activity such as jogging, treadmill, bicycling, low-impact aerobics, swimming, etc. 2. Save the most intensive and  strenuous activity for last such as sit-ups, heavy lifting, contact sports, etc Refrain from any heavy lifting or straining until you are off narcotics for pain control. For the first 2-3 weeks do not lift over 10-15lb.  3. DO NOT PUSH THROUGH PAIN. Let pain be your guide: If it hurts to do something, don't do it. Pain is your body warning you to avoid that activity for another week until the pain goes down. 4. You may drive when you are no longer taking prescription pain medication, you can comfortably wear a seatbelt, and you can safely maneuver your car and apply brakes. 5. You may have sexual intercourse when it is comfortable.  9. FOLLOW UP in our office  1. Please call [facility] at [phone] to set up an appointment to see your surgeon in the office for a follow-up appointment approximately 2-3 weeks after your surgery. 2. Make sure that you call for this appointment the day you arrive home to insure a convenient appointment time.      10. IF YOU HAVE DISABILITY OR FAMILY LEAVE FORMS, BRING THEM TO THE               OFFICE FOR PROCESSING.   WHEN TO CALL us [phone]:  1. Poor pain control 2. Reactions / problems with new medications (rash/itching, nausea, etc)  3. Fever over 101.5 F (38.5 C) 4. Inability to urinate 5. Nausea and/or vomiting 6. Worsening swelling or bruising 7. Continued bleeding from incision. 8. Increased pain, redness, or drainage from the incision  The clinic staff is available to answer your questions during regular business hours (8:30am-5pm). Please dont hesitate to call and ask to speak to one of our nurses for clinical concerns.  If you have a medical emergency, go to the nearest emergency room or call 911.  A surgeon from [facility] is always on call at the [facility], [place], [place], [place], [place] [address] ?  MAIN: [phone] ? TOLL FREE: [phone] ?  FAX [phone]    [email or web address]   Diet Following Bariatric Surgery The bariatric diet is designed to provide fluids and nourishment while promoting weight loss and healing after bariatric surgery. The diet is divided into 3 stages. Progress to each stage of the diet with your health care providers approval. What do I need to know about my diet following bariatric surgery? Your surgeon may have individual guidelines for you about specific foods or the progression of your diet. Follow your surgeon's guidelines. You will follow these general guidelines during all stages of your diet:  Eat at set times.  Allow 30-45 minutes for each meal.  Take small bites. Chew your food until it is almost a liquid before swallowing it. Try setting down your utensils between bites to help yourself eat slower or make an eat slowly reminder sign.  Do not drink liquids for 30 minutes before meals and for 30 minutes after meals.  Drink between meals.  Stop eating when you are full. If you feel tightness or pressure in your chest, that means you are full. Wait 30 minutes before you try to  eat again.  Take a chewable multivitamin daily in addition to other supplements as directed by your health care provider.  Sip at least 48-64 oz of liquid, preferably water, each day.  Stay away from concentrated sweets with more than 10 g of sugar per serving.  Protein is a very important part of the diet. Have protein with every meal when possible. Try eating your protein food first.  Stage 3 bariatric diet (regular diet) This stage starts about 6-8 weeks after surgery and will continue to promote weight loss. You will be allowed to eat foods of various textures. Ask your dietitian to assist you in meal planning and additional behavioral strategies to make this final stage a long-term success. Diet Guidelines  Meals should not exceed -1 cup. As you heal and advance, you may be able to eat a little more with each meal.  Always listen to your body.  Your diet should include foods from all the food groups.  Slowly add recommended foods to your diet. See the following section for a list of recommended foods.  Eat only at your chosen meal times.  When you feel full, stop eating.  Carbohydrates should be limited. Eat no more than 30 g per meal or 130 g per day. There are about 15-20 g of carbohydrates in 1 piece of bread or a medium piece of fruit.  Stay hydrated. Drink at least 48-64 oz of noncarbonated, zero-calorie fluid per day. Water is the best choice.  At first, avoid hard-to-digest foods like popcorn, nuts, celery, seeds, and the white parts of citrus fruits. With time you may be able to eat these foods.  Take any vitamin supplements as directed by your health care provider.  Do not fast or skip meals. If you are having a hard time eating, talk to your health care provider or dietitian. What foods can I eat in stage 3? Grains Choose whole grains when possible; aim to get half of your total grains as whole grains. These include whole wheat breads, crackers, and pastas. Cold or hot cereals with no added sugars. Rice (brown or white). Vegetables Choose a variety of vegetables. All are allowed. Fruits Choose a variety of fruits. All are allowed. Meat and Other Protein Foods Choose lean sources of protein such as poultry, fish, and eggs. You may need to cook meats to tender at first. Smooth nut butters. Beans. Dairy Choose low-fat or nonfat dairy items (such as cheese, milk, and yogurt). Beverages Decaffeinated coffee. Caffeine-free tea. Sugar-free soft drinks without caffeine. Limit alcohol. Condiments All are acceptable. Choose low-fat and low-sodium when possible. Sweets and Desserts Low-fat, low-sugar options. As part of a healthy diet, everyone should limit added sugars. Fat and Oil Choose healthy fats such as olive oil, canola oil, and avocados. The items listed above may not be a complete  list of recommended foods or beverages. Contact your dietitian for more options. This information is not intended to replace advice given to you by your health care provider. Make sure you discuss any questions you have with your health care provider. Document Released: 11/14/2002 Document Revised: 10/16/2015 Document Reviewed: 03/14/2013 Elsevier Interactive Patient Education  2018 [employer].    Smoking Tobacco Information Smoking tobacco will very likely harm your health. Tobacco contains a poisonous (toxic), colorless chemical called nicotine. Nicotine affects the brain and makes tobacco addictive. This change in your brain can make it hard to stop smoking. Tobacco also has other toxic chemicals that can hurt your body and raise  your risk of many cancers. How can smoking tobacco affect me? Smoking tobacco can increase your chances of having serious health conditions, such as:  Cancer. Smoking is most commonly associated with lung cancer, but can lead to cancer in other parts of the body.  Chronic obstructive pulmonary disease (COPD). This is a long-term lung condition that makes it hard to breathe. It also gets worse over time.  High blood pressure (hypertension), heart disease, stroke, or heart attack.  Lung infections, such as pneumonia.  Cataracts. This is when the lenses in the eyes become clouded.  Digestive problems. This may include peptic ulcers, heartburn, and gastroesophageal reflux disease (GERD).  Oral health problems, such as gum disease and tooth loss.  Loss of taste and smell.  Smoking can affect your appearance by causing:  Wrinkles.  Yellow or stained teeth, fingers, and fingernails.  Smoking tobacco can also affect your social life.  Many workplaces, [employer], hotels, and public places are tobacco-free. This means that you may experience challenges in finding places to smoke when away from home.  The cost of a smoking habit can be expensive. Expenses  for someone who smokes come in two ways: ? You spend money on a regular basis to buy tobacco. ? Your health care costs in the long-term are higher if you smoke.  Tobacco smoke can also affect the health of those around you. Children of smokers have greater chances of: ? Sudden infant death syndrome (SIDS). ? Ear infections. ? Lung infections.  What lifestyle changes can be made?  Do not start smoking. Quit if you already do.  To quit smoking: ? Make a plan to quit smoking and commit yourself to it. Look for programs to help you and ask your health care provider for recommendations and ideas. ? Talk with your health care provider about using nicotine replacement medicines to help you quit. Medicine replacement medicines include gum, lozenges, patches, sprays, or pills. ? Do not replace cigarette smoking with electronic cigarettes, which are commonly called e-cigarettes. The safety of e-cigarettes is not known, and some may contain harmful chemicals. ? Avoid places, people, or situations that tempt you to smoke. ? If you try to quit but return to smoking, don't give up hope. It is very common for people to try a number of times before they fully succeed. When you feel ready again, give it another try.  Quitting smoking might affect the way you eat as well as your weight. Be prepared to monitor your eating habits. Get support in planning and following a healthy diet.  Ask your health care provider about having regular tests (screenings) to check for cancer. This may include blood tests, imaging tests, and other tests.  Exercise regularly. Consider taking walks, joining a gym, or doing yoga or exercise classes.  Develop skills to manage your stress. These skills include meditation. What are the benefits of quitting smoking? By quitting smoking, you may:  Lower your risk of getting cancer and other diseases caused by smoking.  Live longer.  Breathe better.  Lower your blood pressure  and heart rate.  Stop your addiction to tobacco.  Stop creating secondhand smoke that hurts other people.  Improve your sense of taste and smell.  Look better over time, due to having fewer wrinkles and less staining.  What can happen if changes are not made? If you do not stop smoking, you may:  Get cancer and other diseases.  Develop COPD or other long-term (chronic) lung conditions.  Develop  serious problems with your heart and blood vessels (cardiovascular system).  Need more tests to screen for problems caused by smoking.  Have higher, long-term healthcare costs from medicines or treatments related to smoking.  Continue to have worsening changes in your lungs, mouth, and nose.  Where to find support: To get support to quit smoking, consider:  Asking your health care provider for more information and resources.  Taking classes to learn more about quitting smoking.  Looking for local organizations that offer resources about quitting smoking.  Joining a support group for people who want to quit smoking in your local community.  Where to find more information: You may find more information about quitting smoking from:  HelpGuide.org: www.helpguide.org/articles/addictions/how-to-quit-smoking.htm  https://hall.com/: smokefree.gov  American Lung Association: www.lung.org  Contact a health care provider if:  You have problems breathing.  Your lips, nose, or fingers turn blue.  You have chest pain.  You are coughing up blood.  You feel faint or you pass out.  You have other noticeable changes that cause you to worry. Summary  Smoking tobacco can negatively affect your health, the health of those around you, your finances, and your social life.  Do not start smoking. Quit if you already do. If you need help quitting, ask your health care provider.  Think about joining a support group for people who want to quit smoking in your local community. There are many  effective programs that will help you to quit this behavior. This information is not intended to replace advice given to you by your health care provider. Make sure you discuss any questions you have with your health care provider. Document Released: 05/25/2016 Document Revised: 05/25/2016 Document Reviewed: 05/25/2016 Elsevier Interactive Patient Education  [employer].

## 2017-02-16 ENCOUNTER — Encounter (HOSPITAL_COMMUNITY): Payer: Self-pay | Admitting: Surgery

## 2017-02-16 DIAGNOSIS — K8012 Calculus of gallbladder with acute and chronic cholecystitis without obstruction: Secondary | ICD-10-CM | POA: Diagnosis not present

## 2017-02-16 MED ORDER — SUCRALFATE 1 G PO TABS: 1.0000 g | ORAL_TABLET | Freq: Three times a day (TID) | ORAL | 1 refills | Status: DC

## 2017-02-16 MED ORDER — OXYCODONE-ACETAMINOPHEN 5-325 MG PO TABS: 1.0000 | ORAL_TABLET | Freq: Four times a day (QID) | ORAL | Status: DC | PRN

## 2017-02-16 MED ORDER — OXYCODONE-ACETAMINOPHEN 5-325 MG PO TABS: 1.0000 | ORAL_TABLET | Freq: Four times a day (QID) | ORAL | 0 refills | Status: DC | PRN

## 2017-02-16 MED ORDER — HYDROMORPHONE HCL 1 MG/ML IJ SOLN: 0.5000 mg | INTRAMUSCULAR | Status: DC | PRN

## 2017-02-16 MED ORDER — ADULT MULTIVITAMIN W/MINERALS CH: 1.0000 | ORAL_TABLET | Freq: Every day | ORAL | Status: DC

## 2017-02-16 MED FILL — SUCRALFATE 1 GM TABLET: 1 | 15 days supply | Qty: 60 | Fill #0

## 2017-02-16 MED FILL — OXYCOD/ACETAMINOPHEN 5-325M: 5-325 | 5 days supply | Qty: 20 | Fill #0

## 2017-02-16 NOTE — Anesthesia Postprocedure Evaluation (Signed)
Anesthesia Post Note  Patient: Terri [name]  Procedure(s) Performed: Procedure(s) (LRB): LAPAROSCOPIC CHOLECYSTECTOMY (N/A) UPPER GI ENDOSCOPY (N/A) LAPAROSCOPIC LYSIS OF ADHESIONS     Patient location during evaluation: PACU Anesthesia Type: General Level of consciousness: awake and alert Pain management: pain level controlled Vital Signs Assessment: post-procedure vital signs reviewed and stable Respiratory status: spontaneous breathing, nonlabored ventilation, respiratory function stable and patient connected to nasal cannula oxygen Cardiovascular status: blood pressure returned to baseline and stable Postop Assessment: no apparent nausea or vomiting Anesthetic complications: no    Last Vitals:  Vitals:   02/16/17 0049 02/16/17 0434  BP: (!) 137/95 (!) 141/83  Pulse: 85 84  Resp: 18 18  Temp: 37 C 37.1 C  SpO2: 97% 97%    Last Pain:  Vitals:   02/16/17 0822  TempSrc:   PainSc: 9                  Ardene Remley S

## 2017-02-16 NOTE — Progress Notes (Signed)
Pt alert, oriented, tolerating diet, ambulating, and passing gas. D/C instructions and prescription were given, all questions answered.

## 2017-02-16 NOTE — Discharge Summary (Signed)
[facility] Discharge Summary   Patient ID: Terri [name] MRN: [id number] DOB/AGE: 1975-09-15 41 y.o.  Admit date: 02/14/2017 Discharge date: 02/16/2017  Admitting Diagnosis: Cholecystitis  Discharge Diagnosis Patient Active Problem List   Diagnosis Date Noted  . Cholecystitis 02/14/2017  . Opioid abuse with opioid-induced mood disorder ([place]) 01/25/2017  . Anxiety 02/11/2014  . Depression 07/12/2012    Consultants None  Imaging: US Abdomen Limited Ruq  Result Date: 02/14/2017 CLINICAL DATA:  Cholecystitis. EXAM: ULTRASOUND ABDOMEN LIMITED RIGHT UPPER QUADRANT COMPARISON:  09/09/2016 FINDINGS: Gallbladder: Similar to prior, there appears to be tiny layering nonshadowing stones, likely incorporated into dependent sludge. Gallbladder wall thickness measures up to 2.4 mm. Sonographer reports no sonographic Murphy sign. Common bile duct: Diameter: Upper normal diameter at 4-6 mm. Liver: No focal lesion identified. Within normal limits in parenchymal echogenicity. Portal vein is patent on color Doppler imaging with normal direction of blood flow towards the liver. IMPRESSION: Gallstones with borderline gallbladder wall thickening. Sonographer reports no sonographic Murphy sign. If clinical picture is equivocal for cholecystitis, nuclear scintigraphy may prove helpful to further evaluate. Electronically Signed   By: Misty Stanley M.D.   On: 02/14/2017 20:12    Procedures Dr. Kae Heller (02/15/17) - Laparoscopic Cholecystectomy with upper endoscopy  Hospital Course:  Terri [name] is a 41yo female with prior h/o gastric bypass and current tobacco abuse, who was admitted to [facility] 02/14/17 with 3 month history of epigastric and RUQ abdominal pain, acutely worse over the last 2-3 days.  U/S showed gallstones with borderline gallbladder wall thickening. WBC and LFTs WNL. Patient was admitted and underwent laparoscopic cholecystectomy and upper endoscopy to evaluate for ulcers. Upper endo  showed no active ulcer disease, but there was visible suture material on one aspect of her gastrojejunostomy.  Tolerated procedure well and was transferred to the floor.  Diet was advanced as tolerated to bariatric fulls.  On POD1 the patient was voiding well, tolerating diet, ambulating well, pain well controlled, vital signs stable, incisions c/d/i and felt stable for discharge home.  She will continue protonix and carafate at home. Patient will follow up in our office in 2-3 weeks and knows to call with questions or concerns.  She was given a small rx of percocet for postoperative pain and verbalized understanding that she needs to notify PCP (who gives her a monthly rx for narcotic pain medication) that she received this prescription.  [place] controlled substance database currently not working and I was unable to review the patients medication history at time of discharge.   Physical Exam: General:  Alert, NAD, comfortable Cardio: RRR Pulm: effort normal, CTAB Abd:  Soft, ND, nontender, multiple lap incisions C/D/I  Allergies as of 02/16/2017      Reactions   Ketorolac Tromethamine Itching, Nausea And Vomiting   Methocarbamol Diarrhea      Medication List    STOP taking these medications   acetaminophen 500 MG tablet Commonly known as:  TYLENOL   aspirin 81 MG chewable tablet   benzonatate 100 MG capsule Commonly known as:  TESSALON   levofloxacin 750 MG tablet Commonly known as:  LEVAQUIN   predniSONE 50 MG tablet Commonly known as:  DELTASONE     TAKE these medications   albuterol 108 (90 Base) MCG/ACT inhaler Commonly known as:  PROVENTIL HFA;VENTOLIN HFA Inhale 1-2 puffs into the lungs every 4 (four) hours as needed for wheezing or shortness of breath.   amitriptyline 75 MG tablet Commonly known as:  ELAVIL  Take 1 tablet (75 mg total) by mouth at bedtime.   amLODipine 10 MG tablet Commonly known as:  NORVASC Take 10 mg by mouth daily.   cyclobenzaprine 10 MG  tablet Commonly known as:  FLEXERIL Take 1 tablet (10 mg total) by mouth 2 (two) times daily as needed for muscle spasms.   DULoxetine 60 MG capsule Commonly known as:  CYMBALTA Take 60 mg by mouth daily.   ferrous sulfate 325 (65 FE) MG tablet Take 1 tablet (325 mg total) by mouth daily.   gabapentin 300 MG capsule Commonly known as:  NEURONTIN Take 1 capsule (300 mg total) by mouth 3 (three) times daily.   lisinopril-hydrochlorothiazide 10-12.5 MG tablet Commonly known as:  PRINZIDE,ZESTORETIC Take 1 tablet by mouth daily.   multivitamin with minerals Tabs tablet Take 1 tablet by mouth daily.   ondansetron 8 MG disintegrating tablet Commonly known as:  ZOFRAN-ODT Take 1 tablet by mouth every 8 (eight) hours as needed for nausea/vomiting.   oxyCODONE-acetaminophen 5-325 MG tablet Commonly known as:  PERCOCET/ROXICET Take 1 tablet by mouth every 6 (six) hours as needed for severe pain. What changed:  reasons to take this   sucralfate 1 g tablet Commonly known as:  CARAFATE Take 1 tablet (1 g total) by mouth 4 (four) times daily -  with meals and at bedtime.            Discharge Care Instructions        Start     Ordered   02/16/17 0000  Multiple Vitamin (MULTIVITAMIN WITH MINERALS) TABS tablet  Daily     02/16/17 0927   02/16/17 0000  oxyCODONE-acetaminophen (PERCOCET/ROXICET) 5-325 MG tablet  Every 6 hours PRN     02/16/17 0927   02/16/17 0000  sucralfate (CARAFATE) 1 g tablet  3 times daily with meals & bedtime     02/16/17 6503   02/16/17 0000  Discharge patient    Question Answer Comment  Discharge disposition 01-Home or Self Care   Discharge patient date 02/16/2017      02/16/17 5465       Follow-up [facility] Go on 03/03/2017.   Specialty:  General Surgery Why:  Your appointment is 03/03/17 at 2:30 pm. Please arrive 30 minutes prior to your appointment to check in and fill out necessary paperwork. Contact  information: [address] [place] [phone]          Signed: Wellington Hampshire, [facility] 02/16/2017, 9:28 AM Pager: [phone] Consults: [phone] Mon-Fri 7:00 am-4:30 pm Sat-Sun 7:00 am-11:30 am

## 2017-02-25 ENCOUNTER — Encounter (HOSPITAL_COMMUNITY): Payer: Self-pay

## 2017-02-25 ENCOUNTER — Encounter: Payer: Self-pay | Admitting: Vascular Surgery

## 2017-03-13 ENCOUNTER — Emergency Department (HOSPITAL_COMMUNITY): Payer: Medicare Other

## 2017-03-13 ENCOUNTER — Emergency Department (HOSPITAL_COMMUNITY)
Admission: EM | Admit: 2017-03-13 | Discharge: 2017-03-13 | Disposition: A | Discharge status: Home / Self Care | Payer: Medicare Other | Attending: Emergency Medicine | Admitting: Emergency Medicine

## 2017-03-13 ENCOUNTER — Encounter (HOSPITAL_COMMUNITY): Payer: Self-pay | Admitting: Emergency Medicine

## 2017-03-13 DIAGNOSIS — G8918 Other acute postprocedural pain: Secondary | ICD-10-CM | POA: Diagnosis not present

## 2017-03-13 DIAGNOSIS — F17210 Nicotine dependence, cigarettes, uncomplicated: Secondary | ICD-10-CM | POA: Diagnosis not present

## 2017-03-13 DIAGNOSIS — R1013 Epigastric pain: Secondary | ICD-10-CM | POA: Diagnosis present

## 2017-03-13 DIAGNOSIS — Z79899 Other long term (current) drug therapy: Secondary | ICD-10-CM | POA: Diagnosis not present

## 2017-03-13 DIAGNOSIS — I10 Essential (primary) hypertension: Secondary | ICD-10-CM | POA: Diagnosis not present

## 2017-03-13 LAB — COMPREHENSIVE METABOLIC PANEL
ALK PHOS: 85 U/L (ref 38–126)
ALT: 9 U/L — AB (ref 14–54)
ANION GAP: 8 (ref 5–15)
AST: 17 U/L (ref 15–41)
Albumin: 3.2 g/dL — ABNORMAL LOW (ref 3.5–5.0)
BILIRUBIN TOTAL: 0.3 mg/dL (ref 0.3–1.2)
BUN: 7 mg/dL (ref 6–20)
CALCIUM: 8.8 mg/dL — AB (ref 8.9–10.3)
CO2: 25 mmol/L (ref 22–32)
Chloride: 104 mmol/L (ref 101–111)
Creatinine, Ser: 0.61 mg/dL (ref 0.44–1.00)
GFR calc Af Amer: >60 mL/min (ref >60)
GFR calc non Af Amer: >60 mL/min (ref >60)
GLUCOSE: 86 mg/dL (ref 65–99)
Potassium: 3.7 mmol/L (ref 3.5–5.1)
SODIUM: 137 mmol/L (ref 135–145)
Total Protein: 6.3 g/dL — ABNORMAL LOW (ref 6.5–8.1)

## 2017-03-13 LAB — CBC
HCT: 29.8 % — ABNORMAL LOW (ref 36.0–46.0)
Hemoglobin: 9.7 g/dL — ABNORMAL LOW (ref 12.0–15.0)
MCH: 21.3 pg — AB (ref 26.0–34.0)
MCHC: 32.6 g/dL (ref 30.0–36.0)
MCV: 65.5 fL — ABNORMAL LOW (ref 78.0–100.0)
PLATELETS: 345 10*3/uL (ref 150–400)
RBC: 4.55 MIL/uL (ref 3.87–5.11)
RDW: 19.6 % — AB (ref 11.5–15.5)
WBC: 7.6 10*3/uL (ref 4.0–10.5)

## 2017-03-13 LAB — I-STAT BETA HCG BLOOD, ED (MC, WL, AP ONLY)

## 2017-03-13 LAB — LIPASE, BLOOD: Lipase: 24 U/L (ref 11–51)

## 2017-03-13 MED ORDER — DICYCLOMINE HCL 10 MG PO CAPS
10.0000 mg | ORAL_CAPSULE | Freq: Once | ORAL | Status: AC
  Administered 2017-03-13: 10 mg via ORAL
  Filled 2017-03-13: qty 1

## 2017-03-13 MED ORDER — PANTOPRAZOLE SODIUM 40 MG IV SOLR
40.0000 mg | Freq: Once | INTRAVENOUS | Status: AC
  Administered 2017-03-13: 40 mg via INTRAVENOUS
  Filled 2017-03-13: qty 40

## 2017-03-13 MED ORDER — SODIUM CHLORIDE 0.9 % IV BOLUS (SEPSIS)
500.0000 mL | Freq: Once | INTRAVENOUS | Status: AC
  Administered 2017-03-13: 500 mL via INTRAVENOUS

## 2017-03-13 MED ORDER — LORAZEPAM 1 MG PO TABS
1.0000 mg | ORAL_TABLET | Freq: Once | ORAL | Status: AC
  Administered 2017-03-13: 1 mg via ORAL
  Filled 2017-03-13: qty 1

## 2017-03-13 MED ORDER — GI COCKTAIL ~~LOC~~
30.0000 mL | Freq: Once | ORAL | Status: DC
  Filled 2017-03-13: qty 30

## 2017-03-13 NOTE — ED Notes (Addendum)
When attempting to give GI cocktail, pt started screaming at nurse saying "It hurts to swallow and drink and eat" and RN attempted to tell pt is has numbing medication in it to help her pain and pt set up and flung her hands out and started screaming in the Nurses face and cussing.  RN felt threatened and called security

## 2017-03-13 NOTE — ED Notes (Signed)
This Probation officer went in and spoke with pt. Pt is trying to manipulate staff into getting different medications for her. Pt stated she wanted her IV out so she could leave. I started to take out IV and pt then stated she would stay. I explained to pt that her being verbally aggressive would not be tolerated. EDP made aware.

## 2017-03-13 NOTE — ED Triage Notes (Signed)
Pt c/o RUQ abdominal pain since last week. She  reports having a cholecystectomy on 9/25. She states "I feel like nothing has been taken out." Pt is tearful in triage. Nausea and vomiting but denies diarrhea or fever.

## 2017-03-13 NOTE — ED Notes (Signed)
Pt left without receiving d/c paperwork and without vitals

## 2017-03-13 NOTE — ED Provider Notes (Signed)
Terri [name] Note   CSN: [id number] Arrival date & time: 03/13/17  1113     History   Chief Complaint Chief Complaint  Patient presents with  . Abdominal Pain  . Post-op Problem    HPI Terri [name] is a 41 y.o. female.  The history is provided by the patient. No language interpreter was used.  Abdominal Pain     Terri [name] is a 41 y.o. female who presents to the Emergency Department complaining of abdominal pain. She reports several weeks of epigastric pain.  Pain was initially waxing and waning but now constant in nature.   She had a cholecystectomy performed on September 25 and had temporary improvement in her pain only to have recurrent abdominal pain. She has occasional emesis. She has associated temperature to 99-100 at home. No diarrhea, dysuria, constipation. She's been taking Carafate and Protonix with no significant change in her symptoms. She does not have a gastroenterologist.  Sxs are constant and worsening.  She does not drink alcohol or do street drugs.   Past Medical History:  Diagnosis Date  . Bipolar disorder ([place])   . Chronic back pain   . Hypertension   . Migraine headache   . Sciatica   . Stress incontinence     Patient Active Problem List   Diagnosis Date Noted  . Cholecystitis 02/14/2017  . Opioid abuse with opioid-induced mood disorder ([place]) 01/25/2017  . Anxiety 02/11/2014  . Depression 07/12/2012    Past Surgical History:  Procedure Laterality Date  . ANKLE SURGERY    . BACK SURGERY    . CESAREAN SECTION    . CHOLECYSTECTOMY N/A 02/15/2017   Procedure: LAPAROSCOPIC CHOLECYSTECTOMY;  Surgeon: Clovis Riley, MD;  Location: [facility];  Service: General;  Laterality: N/A;  . FRACTURE SURGERY    . GASTRIC BYPASS    . LAPAROSCOPIC LYSIS OF ADHESIONS  02/15/2017   Procedure: LAPAROSCOPIC LYSIS OF ADHESIONS;  Surgeon: Clovis Riley, MD;  Location: [facility];  Service: General;;  . LUMBAR [place]    . LUMBAR FUSION    . TUBAL LIGATION    . UPPER GI ENDOSCOPY N/A 02/15/2017   Procedure: UPPER GI ENDOSCOPY;  Surgeon: Clovis Riley, MD;  Location: [facility];  Service: General;  Laterality: N/A;    OB History    No data available       Home Medications    Prior to Admission medications   Medication Sig Start Date End Date Taking? Authorizing [name]  acetaminophen-codeine (TYLENOL #3) 300-30 MG tablet Take 1 tablet by mouth every 6 (six) hours. 03/12/17  Yes [name], Historical, MD  amitriptyline (ELAVIL) 75 MG tablet Take 1 tablet (75 mg total) by mouth at bedtime. 02/11/14  Yes Patrecia Pour, NP  amLODipine (NORVASC) 10 MG tablet Take 10 mg by mouth daily.   Yes [name], Historical, MD  DULoxetine (CYMBALTA) 60 MG capsule Take 60 mg by mouth daily. 12/23/14  Yes [name], Historical, MD  lisinopril-hydrochlorothiazide (PRINZIDE,ZESTORETIC) 10-12.5 MG tablet Take 1 tablet by mouth daily. 01/22/17  Yes [name], Historical, MD  pantoprazole (PROTONIX) 40 MG tablet Take 40 mg by mouth 2 (two) times daily. 12/16/16  Yes [name], Historical, MD  penicillin v potassium (VEETID) 500 MG tablet Take 500 mg by mouth every 6 (six) hours. 03/12/17  Yes [name], Historical, MD  sucralfate (CARAFATE) 1 g tablet Take 1 tablet (1 g total) by mouth 4 (four) times daily -  with  meals and at bedtime. 02/16/17  Yes Meuth, Brooke A, PA-C  albuterol (PROVENTIL HFA;VENTOLIN HFA) 108 (90 BASE) MCG/ACT inhaler Inhale 1-2 puffs into the lungs every 4 (four) hours as needed for wheezing or shortness of breath. 03/07/15   Pisciotta, Elmyra Ricks, PA-C  cyclobenzaprine (FLEXERIL) 10 MG tablet Take 1 tablet (10 mg total) by mouth 2 (two) times daily as needed for muscle spasms. Patient not taking: Reported on 01/24/2017 02/18/16   Gareth Morgan, MD  ferrous sulfate 325 (65 FE) MG tablet Take 1 tablet (325 mg total) by mouth daily. Patient not taking: Reported on 02/14/2017 01/24/17   Blanchie Dessert, MD    gabapentin (NEURONTIN) 300 MG capsule Take 1 capsule (300 mg total) by mouth 3 (three) times daily. Patient not taking: Reported on 03/13/2017 02/11/14   Patrecia Pour, NP  Multiple Vitamin (MULTIVITAMIN WITH MINERALS) TABS tablet Take 1 tablet by mouth daily. Patient not taking: Reported on 03/13/2017 02/16/17   Wellington Hampshire, PA-C  oxyCODONE-acetaminophen (PERCOCET/ROXICET) 5-325 MG tablet Take 1 tablet by mouth every 6 (six) hours as needed for severe pain. Patient not taking: Reported on 03/13/2017 02/16/17   Wellington Hampshire, PA-C    Family History Family History  Problem Relation Age of Onset  . Diabetes Mother   . Hypertension Mother   . Diabetes Father   . Hypertension Father     Social History Social History  Substance Use Topics  . Smoking status: Current Every Day Smoker    Packs/day: 1.00    Types: Cigarettes  . Smokeless tobacco: Never Used  . Alcohol use No     Allergies   Ketorolac tromethamine and Methocarbamol   Review of Systems Review of Systems  Gastrointestinal: Positive for abdominal pain.  All other systems reviewed and are negative.    Physical Exam Updated Vital Signs BP (!) 140/93 (BP Location: Right Arm)   Pulse (!) 107   Temp 99 F (37.2 C) (Oral)   Resp 16   Ht 5\' 9"  (1.753 m)   Wt 88 kg (194 lb)   LMP 02/11/2017   SpO2 100%   BMI 28.65 kg/m   Physical Exam  Constitutional: She is oriented to person, place, and time. She appears well-developed and well-nourished.  anxious  HENT:  Head: Normocephalic and atraumatic.  Cardiovascular: Normal rate and regular rhythm.   No murmur heard. Pulmonary/Chest: Effort normal and breath sounds normal. No respiratory distress.  Abdominal: Soft.  Mild epigastric tenderness  Musculoskeletal: She exhibits no edema or tenderness.  Neurological: She is alert and oriented to person, place, and time.  Skin: Skin is warm and dry.  Psychiatric: She has a normal mood and affect. Her behavior is  normal.  Nursing note and vitals reviewed.    ED Treatments / Results  Labs (all labs ordered are listed, but only abnormal results are displayed) Labs Reviewed  COMPREHENSIVE METABOLIC PANEL - Abnormal; Notable for the following:       Result Value   Calcium 8.8 (*)    Total Protein 6.3 (*)    Albumin 3.2 (*)    ALT 9 (*)    All other components within normal limits  CBC - Abnormal; Notable for the following:    Hemoglobin 9.7 (*)    HCT 29.8 (*)    MCV 65.5 (*)    MCH 21.3 (*)    RDW 19.6 (*)    All other components within normal limits  LIPASE, BLOOD  URINALYSIS, ROUTINE W REFLEX MICROSCOPIC  RAPID URINE DRUG SCREEN, HOSP PERFORMED  I-STAT BETA HCG BLOOD, ED ([facility], [facility], [facility] ONLY)    EKG  EKG Interpretation None       Radiology Dg Abd Acute W/chest  Result Date: 03/13/2017 CLINICAL DATA:  Abdominal pain for 1 week. Nausea and vomiting. Cholecystectomy September 25th 2018. Gastric bypass. EXAM: DG ABDOMEN ACUTE W/ 1V CHEST COMPARISON:  None. FINDINGS: The heart, hila, and mediastinum are normal. No pneumothorax. No pulmonary nodules, masses, or focal infiltrates. No free air, portal venous gas, or pneumatosis. Cholecystectomy clips in the right upper quadrant. No bowel obstruction. No cause for the patient's symptoms identified on today's study. IMPRESSION: Negative abdominal radiographs.  No acute cardiopulmonary disease. Electronically Signed   By: Dorise Bullion III M.D   On: 03/13/2017 15:17    Procedures Procedures (including critical care time)  Medications Ordered in ED Medications  gi cocktail (Maalox,Lidocaine,Donnatal) (30 mLs Oral Refused 03/13/17 1525)  pantoprazole (PROTONIX) injection 40 mg (40 mg Intravenous Given 03/13/17 1439)  sodium chloride 0.9 % bolus 500 mL (0 mLs Intravenous Stopped 03/13/17 1627)  LORazepam (ATIVAN) tablet 1 mg (1 mg Oral Given 03/13/17 1439)  dicyclomine (BENTYL) capsule 10 mg (10 mg Oral Given 03/13/17 1439)     Initial  Impression / Assessment and Plan / ED Course  I have reviewed the triage vital signs and the nursing notes.  Pertinent labs & imaging results that were available during my care of the patient were reviewed by me and considered in my medical decision making (see chart for details).     Pt here with several weeks to months of epigastric pain.  Initial concern for gastritis vs pancreatitis vs ulcer vs complication secondary to recent surgery.  On record review patient received multiple narcotic pain medications but initially denied being on pain medication.  She also denies illicit drug use but was recently seen at [facility] for heroin abuse.  Attempted to treat symptoms with nonnarcotic pain medications pending work up.   Patient with threatening behavior towards nursing staff.  Discussed with patient will make additional attempts to treat her symptoms with non narcotic pain medications and obtain CT abdomen to further evaluate her pain.  Patient refused further imaging and left the department.      Final Clinical Impressions(s) / ED Diagnoses   Final diagnoses:  Epigastric abdominal pain    New Prescriptions Discharge Medication List as of 03/13/2017  4:28 PM       Quintella Reichert, MD 03/13/17 1906

## 2017-03-13 NOTE — ED Notes (Signed)
Pt would not let me give her a urine cup for a urine sample, pt also refused socks or shoes while walking to the restroom.

## 2017-03-13 NOTE — ED Notes (Signed)
When RN went to go give pt her medications pt screamed and told RN to "Go Fuck yourself" and "Do not touch me" because RN was not ordered to give a narcotic.  RN tried to explain what the medicines were for and Pt started screaming at RN to get out and that she "hates this damn hospital".  RN left the room.  MD made aware.  Pt screaming and crying currently and yelled at the Secretary.

## 2017-03-13 NOTE — ED Notes (Signed)
Patient refused to allow me to do blood draw. Patient stated that she will allow draw when in a room

## 2017-05-05 ENCOUNTER — Other Ambulatory Visit: Payer: Self-pay | Admitting: Internal Medicine

## 2017-05-05 DIAGNOSIS — Z1231 Encounter for screening mammogram for malignant neoplasm of breast: Secondary | ICD-10-CM

## 2017-05-31 ENCOUNTER — Ambulatory Visit: Payer: Self-pay

## 2017-06-17 ENCOUNTER — Emergency Department (HOSPITAL_COMMUNITY): Payer: Medicare Other

## 2017-06-17 ENCOUNTER — Encounter (HOSPITAL_COMMUNITY): Payer: Self-pay

## 2017-06-17 ENCOUNTER — Emergency Department (HOSPITAL_COMMUNITY)
Admission: EM | Admit: 2017-06-17 | Discharge: 2017-06-17 | Disposition: A | Discharge status: Home / Self Care | Payer: Medicare Other | Attending: Emergency Medicine | Admitting: Emergency Medicine

## 2017-06-17 ENCOUNTER — Other Ambulatory Visit: Payer: Self-pay

## 2017-06-17 DIAGNOSIS — Z79899 Other long term (current) drug therapy: Secondary | ICD-10-CM | POA: Insufficient documentation

## 2017-06-17 DIAGNOSIS — I10 Essential (primary) hypertension: Secondary | ICD-10-CM | POA: Diagnosis not present

## 2017-06-17 DIAGNOSIS — F17210 Nicotine dependence, cigarettes, uncomplicated: Secondary | ICD-10-CM | POA: Diagnosis not present

## 2017-06-17 DIAGNOSIS — F1910 Other psychoactive substance abuse, uncomplicated: Secondary | ICD-10-CM | POA: Diagnosis present

## 2017-06-17 NOTE — ED Triage Notes (Addendum)
BIB EMS from Home 9 hrs. After suspected OD of Heroine. Pts boyfriend administered narcan x2; (x1 at 3pm and x1 at 1220am). Pt c/o 8/10 chest pain and back pain upon arrival r/t to falling after initially overdosing. Pt rPt denies neck and head pain. Pt ambulatory on the scene.   EMS Vitals P 88

## 2017-06-17 NOTE — ED Notes (Signed)
Bed: LM78 Expected date:  Expected time:  Means of arrival:  Comments: EMS 42 yo female overdose earlier today and fell-Narcan

## 2017-06-17 NOTE — ED Provider Notes (Signed)
[facility] DEPT Provider Note   CSN: [id number] Arrival date & time: 06/17/17  0113     History   Chief Complaint Chief Complaint  Patient presents with  . Drug Overdose    HPI Terri [name] is a 42 y.o. female.  Patient presents to the emergency department with chief complaint of indication reaction.  She states that she overdosed yesterday afternoon and was given Narcan.  She states that later in the day she felt sleepy and her fianc gave her intranasal Narcan again.  She states that this caused her to feel agitated and "twitchy."  She states that she eventually called EMS and was brought to the hospital.  She states that she feels fine now.  She denies any headache, chest pain, shortness of breath, agitation, anxiety.  Her last use was yesterday afternoon.  She denies any other associated symptoms.  She states that this scared her, and she plans to stop using drugs.   The history is provided by the patient. No language interpreter was used.    Past Medical History:  Diagnosis Date  . Bipolar disorder ([place])   . Chronic back pain   . Hypertension   . Migraine headache   . Sciatica   . Stress incontinence     Patient Active Problem List   Diagnosis Date Noted  . Cholecystitis 02/14/2017  . Opioid abuse with opioid-induced mood disorder ([place]) 01/25/2017  . Anxiety 02/11/2014  . Depression 07/12/2012    Past Surgical History:  Procedure Laterality Date  . ANKLE SURGERY    . BACK SURGERY    . CESAREAN SECTION    . CHOLECYSTECTOMY N/A 02/15/2017   Procedure: LAPAROSCOPIC CHOLECYSTECTOMY;  Surgeon: Clovis Riley, MD;  Location: [facility];  Service: General;  Laterality: N/A;  . FRACTURE SURGERY    . GASTRIC BYPASS    . LAPAROSCOPIC LYSIS OF ADHESIONS  02/15/2017   Procedure: LAPAROSCOPIC LYSIS OF ADHESIONS;  Surgeon: Clovis Riley, MD;  Location: [facility];  Service: General;;  . LUMBAR [place]    . LUMBAR FUSION    . TUBAL  LIGATION    . UPPER GI ENDOSCOPY N/A 02/15/2017   Procedure: UPPER GI ENDOSCOPY;  Surgeon: Clovis Riley, MD;  Location: [facility];  Service: General;  Laterality: N/A;    OB History    No data available       Home Medications    Prior to Admission medications   Medication Sig Start Date End Date Taking? Authorizing Provider  albuterol (PROVENTIL HFA;VENTOLIN HFA) 108 (90 BASE) MCG/ACT inhaler Inhale 1-2 puffs into the lungs every 4 (four) hours as needed for wheezing or shortness of breath. 03/07/15  Yes Pisciotta, Elmyra Ricks, PA-C  amitriptyline (ELAVIL) 75 MG tablet Take 1 tablet (75 mg total) by mouth at bedtime. 02/11/14  Yes Patrecia Pour, NP  amLODipine (NORVASC) 10 MG tablet Take 10 mg by mouth daily.   Yes [provider]  DULoxetine (CYMBALTA) 60 MG capsule Take 60 mg by mouth daily. 12/23/14  Yes [provider]  lisinopril-hydrochlorothiazide (PRINZIDE,ZESTORETIC) 10-12.5 MG tablet Take 1 tablet by mouth daily. 01/22/17  Yes [provider]  pantoprazole (PROTONIX) 40 MG tablet Take 40 mg by mouth 2 (two) times daily. 12/16/16  Yes [provider]  cyclobenzaprine (FLEXERIL) 10 MG tablet Take 1 tablet (10 mg total) by mouth 2 (two) times daily as needed for muscle spasms. Patient not taking: Reported on 01/24/2017 02/18/16   Gareth Morgan,  MD  ferrous sulfate 325 (65 FE) MG tablet Take 1 tablet (325 mg total) by mouth daily. Patient not taking: Reported on 02/14/2017 01/24/17   Blanchie Dessert, MD  gabapentin (NEURONTIN) 300 MG capsule Take 1 capsule (300 mg total) by mouth 3 (three) times daily. Patient not taking: Reported on 03/13/2017 02/11/14   Patrecia Pour, NP  Multiple Vitamin (MULTIVITAMIN WITH MINERALS) TABS tablet Take 1 tablet by mouth daily. Patient not taking: Reported on 03/13/2017 02/16/17   Wellington Hampshire, PA-C  oxyCODONE-acetaminophen (PERCOCET/ROXICET) 5-325 MG tablet Take 1 tablet by mouth every 6 (six) hours as needed for  severe pain. Patient not taking: Reported on 03/13/2017 02/16/17   Margie Billet A, PA-C  sucralfate (CARAFATE) 1 g tablet Take 1 tablet (1 g total) by mouth 4 (four) times daily -  with meals and at bedtime. Patient not taking: Reported on 06/17/2017 02/16/17   Margie Billet A, PA-C  cetirizine (ZYRTEC) 10 MG tablet Take 1 tablet (10 mg total) by mouth daily. Patient not taking: Reported on 05/05/2014 02/11/14 06/11/14  Patrecia Pour, NP    Family History Family History  Problem Relation Age of Onset  . Diabetes Mother   . Hypertension Mother   . Diabetes Father   . Hypertension Father     Social History Social History   Tobacco Use  . Smoking status: Current Every Day Smoker    Packs/day: 1.00    Types: Cigarettes  . Smokeless tobacco: Never Used  Substance Use Topics  . Alcohol use: No  . Drug use: Yes    Types: IV    Comment: Heroin     Allergies   Ketorolac tromethamine and Methocarbamol   Review of Systems Review of Systems  All other systems reviewed and are negative.    Physical Exam Updated Vital Signs BP 135/84 (BP Location: Right Arm)   Pulse 95   Temp 99.4 F (37.4 C) (Oral)   Resp (!) 23   LMP 05/26/2017   SpO2 98%   Physical Exam  Constitutional: She is oriented to person, place, and time. She appears well-developed and well-nourished.  HENT:  Head: Normocephalic and atraumatic.  Eyes: Conjunctivae and EOM are normal. Pupils are equal, round, and reactive to light.  Neck: Normal range of motion. Neck supple.  Cardiovascular: Normal rate and regular rhythm. Exam reveals no gallop and no friction rub.  No murmur heard. Pulmonary/Chest: Effort normal and breath sounds normal. No respiratory distress. She has no wheezes. She has no rales. She exhibits no tenderness.  Abdominal: Soft. Bowel sounds are normal. She exhibits no distension and no mass. There is no tenderness. There is no rebound and no guarding.  Musculoskeletal: Normal range of  motion. She exhibits no edema or tenderness.  Neurological: She is alert and oriented to person, place, and time.  Skin: Skin is warm and dry.  Psychiatric: She has a normal mood and affect. Her behavior is normal. Judgment and thought content normal.  Nursing note and vitals reviewed.    ED Treatments / Results  Labs (all labs ordered are listed, but only abnormal results are displayed) Labs Reviewed - No data to display  EKG  EKG Interpretation None       Radiology Dg Chest 2 View  Result Date: 06/17/2017 CLINICAL DATA:  Heroin overdose tonight. [place] in struck back of head. Upper mid back pain. EXAM: CHEST  2 VIEW COMPARISON:  03/13/2017 FINDINGS: The heart size and mediastinal contours are within normal limits.  Both lungs are clear. The visualized skeletal structures are unremarkable. IMPRESSION: No active cardiopulmonary disease. Electronically Signed   By: Lucienne Capers M.D.   On: 06/17/2017 02:59    Procedures Procedures (including critical care time)  Medications Ordered in ED Medications - No data to display   Initial Impression / Assessment and Plan / ED Course  I have reviewed the triage vital signs and the nursing notes.  Pertinent labs & imaging results that were available during my care of the patient were reviewed by me and considered in my medical decision making (see chart for details).     Patient with complaints of twitching and agitation after taking Narcan yesterday afternoon.  She has no complaints now, and is asymptomatic.  She was concerned that she might have fallen and hit her chest.  Her chest x-ray is negative.  She feels well.  Her vital signs are stable.  Last drug use was yesterday afternoon.  I do not feel that additional monitoring in the emergency department is necessary at this time.  She feels comfortable with the plan for being discharged.  I have advised her to cease using heroin.  She agrees, and states that this episode scared her  straight.  Final Clinical Impressions(s) / ED Diagnoses   Final diagnoses:  Polysubstance abuse [facility])    ED Discharge Orders    None       Montine Circle, PA-C 06/17/17 0327    Sherwood Gambler, MD 06/17/17 [phone]

## 2017-06-21 ENCOUNTER — Ambulatory Visit: Payer: Self-pay

## 2017-07-04 ENCOUNTER — Encounter (HOSPITAL_COMMUNITY): Payer: Self-pay

## 2017-07-04 ENCOUNTER — Other Ambulatory Visit (HOSPITAL_COMMUNITY): Payer: Self-pay | Admitting: Internal Medicine

## 2017-07-04 DIAGNOSIS — M79605 Pain in left leg: Secondary | ICD-10-CM

## 2017-07-12 ENCOUNTER — Ambulatory Visit (HOSPITAL_COMMUNITY)
Admission: RE | Admit: 2017-07-12 | Discharge: 2017-07-12 | Disposition: A | Discharge status: Home / Self Care | Payer: Medicare Other | Source: Ambulatory Visit | Attending: Surgery | Admitting: Surgery

## 2017-07-12 DIAGNOSIS — M79605 Pain in left leg: Secondary | ICD-10-CM | POA: Diagnosis not present

## 2017-08-10 ENCOUNTER — Emergency Department (HOSPITAL_COMMUNITY): Payer: Medicare Other

## 2017-08-10 ENCOUNTER — Other Ambulatory Visit: Payer: Self-pay

## 2017-08-10 ENCOUNTER — Inpatient Hospital Stay (HOSPITAL_COMMUNITY)
Admission: EM | Admit: 2017-08-10 | Discharge: 2017-08-19 | DRG: 603 | Disposition: A | Discharge status: Home / Self Care | Payer: Medicare Other | Attending: Nephrology | Admitting: Nephrology

## 2017-08-10 DIAGNOSIS — F1114 Opioid abuse with opioid-induced mood disorder: Secondary | ICD-10-CM | POA: Diagnosis present

## 2017-08-10 DIAGNOSIS — L0211 Cutaneous abscess of neck: Principal | ICD-10-CM | POA: Diagnosis present

## 2017-08-10 DIAGNOSIS — M272 Inflammatory conditions of jaws: Secondary | ICD-10-CM | POA: Diagnosis not present

## 2017-08-10 DIAGNOSIS — I10 Essential (primary) hypertension: Secondary | ICD-10-CM

## 2017-08-10 DIAGNOSIS — F319 Bipolar disorder, unspecified: Secondary | ICD-10-CM | POA: Diagnosis present

## 2017-08-10 DIAGNOSIS — F419 Anxiety disorder, unspecified: Secondary | ICD-10-CM | POA: Diagnosis present

## 2017-08-10 DIAGNOSIS — K122 Cellulitis and abscess of mouth: Secondary | ICD-10-CM | POA: Diagnosis present

## 2017-08-10 DIAGNOSIS — M869 Osteomyelitis, unspecified: Secondary | ICD-10-CM | POA: Diagnosis present

## 2017-08-10 DIAGNOSIS — F1110 Opioid abuse, uncomplicated: Secondary | ICD-10-CM | POA: Diagnosis present

## 2017-08-10 DIAGNOSIS — L03221 Cellulitis of neck: Secondary | ICD-10-CM | POA: Diagnosis present

## 2017-08-10 DIAGNOSIS — F17210 Nicotine dependence, cigarettes, uncomplicated: Secondary | ICD-10-CM | POA: Diagnosis present

## 2017-08-10 DIAGNOSIS — T402X5A Adverse effect of other opioids, initial encounter: Secondary | ICD-10-CM | POA: Diagnosis present

## 2017-08-10 DIAGNOSIS — E876 Hypokalemia: Secondary | ICD-10-CM | POA: Diagnosis present

## 2017-08-10 LAB — COMPREHENSIVE METABOLIC PANEL
ALT: 9 U/L — AB (ref 14–54)
AST: 15 U/L (ref 15–41)
Albumin: 3 g/dL — ABNORMAL LOW (ref 3.5–5.0)
Alkaline Phosphatase: 120 U/L (ref 38–126)
Anion gap: 10 (ref 5–15)
BUN: 9 mg/dL (ref 6–20)
CO2: 29 mmol/L (ref 22–32)
CREATININE: 0.64 mg/dL (ref 0.44–1.00)
Calcium: 9 mg/dL (ref 8.9–10.3)
Chloride: 101 mmol/L (ref 101–111)
GFR calc Af Amer: >60 mL/min (ref >60)
GFR calc non Af Amer: >60 mL/min (ref >60)
Glucose, Bld: 105 mg/dL — ABNORMAL HIGH (ref 65–99)
Potassium: 2.9 mmol/L — ABNORMAL LOW (ref 3.5–5.1)
Sodium: 140 mmol/L (ref 135–145)
Total Bilirubin: 0.6 mg/dL (ref 0.3–1.2)
Total Protein: 6.3 g/dL — ABNORMAL LOW (ref 6.5–8.1)

## 2017-08-10 LAB — CBC WITH DIFFERENTIAL/PLATELET
BASOS PCT: 0 %
Basophils Absolute: 0 10*3/uL (ref 0.0–0.1)
EOS PCT: 0 %
Eosinophils Absolute: 0 10*3/uL (ref 0.0–0.7)
HCT: 28.2 % — ABNORMAL LOW (ref 36.0–46.0)
Hemoglobin: 9.1 g/dL — ABNORMAL LOW (ref 12.0–15.0)
LYMPHS ABS: 2.1 10*3/uL (ref 0.7–4.0)
Lymphocytes Relative: 8 %
MCH: 20.4 pg — AB (ref 26.0–34.0)
MCHC: 32.3 g/dL (ref 30.0–36.0)
MCV: 63.2 fL — ABNORMAL LOW (ref 78.0–100.0)
MONO ABS: 1.6 10*3/uL — AB (ref 0.1–1.0)
Monocytes Relative: 6 %
NEUTROS ABS: 22.8 10*3/uL — AB (ref 1.7–7.7)
Neutrophils Relative %: 86 %
PLATELETS: 313 10*3/uL (ref 150–400)
RBC: 4.46 MIL/uL (ref 3.87–5.11)
RDW: 20.1 % — AB (ref 11.5–15.5)
WBC: 26.5 10*3/uL — AB (ref 4.0–10.5)

## 2017-08-10 LAB — I-STAT CG4 LACTIC ACID, ED: LACTIC ACID, VENOUS: 1.07 mmol/L (ref 0.5–1.9)

## 2017-08-10 MED ORDER — ONDANSETRON HCL 4 MG PO TABS: 4.0000 mg | ORAL_TABLET | Freq: Four times a day (QID) | ORAL | Status: DC | PRN

## 2017-08-10 MED ORDER — IBUPROFEN 200 MG PO TABS
400.0000 mg | ORAL_TABLET | Freq: Four times a day (QID) | ORAL | Status: DC | PRN
  Administered 2017-08-11 (×2): 400 mg via ORAL
  Filled 2017-08-10 (×2): qty 2

## 2017-08-10 MED ORDER — ONDANSETRON HCL 4 MG/2ML IJ SOLN
4.0000 mg | Freq: Four times a day (QID) | INTRAMUSCULAR | Status: DC | PRN
  Administered 2017-08-11 – 2017-08-18 (×15): 4 mg via INTRAVENOUS
  Filled 2017-08-10 (×16): qty 2

## 2017-08-10 MED ORDER — ACETAMINOPHEN 500 MG PO TABS
1000.0000 mg | ORAL_TABLET | Freq: Four times a day (QID) | ORAL | Status: DC | PRN
  Administered 2017-08-11: 1000 mg via ORAL
  Filled 2017-08-10: qty 2

## 2017-08-10 MED ORDER — AMPICILLIN-SULBACTAM SODIUM 3 (2-1) G IJ SOLR
3.0000 g | Freq: Four times a day (QID) | INTRAMUSCULAR | Status: DC
  Administered 2017-08-11 – 2017-08-19 (×32): 3 g via INTRAVENOUS
  Filled 2017-08-10 (×33): qty 3

## 2017-08-10 MED ORDER — FENTANYL CITRATE (PF) 100 MCG/2ML IJ SOLN
50.0000 ug | Freq: Once | INTRAMUSCULAR | Status: AC
  Administered 2017-08-10: 50 ug via INTRAVENOUS
  Filled 2017-08-10: qty 2

## 2017-08-10 MED ORDER — PANTOPRAZOLE SODIUM 40 MG PO TBEC
40.0000 mg | DELAYED_RELEASE_TABLET | Freq: Two times a day (BID) | ORAL | Status: DC
  Administered 2017-08-11 – 2017-08-19 (×16): 40 mg via ORAL
  Filled 2017-08-10 (×17): qty 1

## 2017-08-10 MED ORDER — ACETAMINOPHEN 325 MG PO TABS
650.0000 mg | ORAL_TABLET | Freq: Four times a day (QID) | ORAL | Status: DC | PRN
Start: 2017-08-10 — End: 2017-08-11

## 2017-08-10 MED ORDER — DULOXETINE HCL 30 MG PO CPEP
60.0000 mg | ORAL_CAPSULE | Freq: Every day | ORAL | Status: DC
  Administered 2017-08-11 – 2017-08-19 (×9): 60 mg via ORAL
  Filled 2017-08-10 (×9): qty 2

## 2017-08-10 MED ORDER — ONDANSETRON HCL 4 MG/2ML IJ SOLN
4.0000 mg | Freq: Once | INTRAMUSCULAR | Status: AC
  Administered 2017-08-10: 4 mg via INTRAVENOUS
  Filled 2017-08-10: qty 2

## 2017-08-10 MED ORDER — CLINDAMYCIN PHOSPHATE 600 MG/50ML IV SOLN
600.0000 mg | Freq: Once | INTRAVENOUS | Status: AC
  Administered 2017-08-10: 600 mg via INTRAVENOUS
  Filled 2017-08-10: qty 50

## 2017-08-10 MED ORDER — HYDROMORPHONE HCL 1 MG/ML IJ SOLN
1.0000 mg | INTRAMUSCULAR | Status: DC | PRN
  Administered 2017-08-11 (×4): 1 mg via INTRAVENOUS
  Filled 2017-08-10 (×4): qty 1

## 2017-08-10 MED ORDER — SODIUM CHLORIDE 0.9 % IJ SOLN
INTRAMUSCULAR | Status: AC
  Filled 2017-08-10: qty 50

## 2017-08-10 MED ORDER — LISINOPRIL-HYDROCHLOROTHIAZIDE 10-12.5 MG PO TABS: 1.0000 | ORAL_TABLET | Freq: Every day | ORAL | Status: DC

## 2017-08-10 MED ORDER — OXYCODONE-ACETAMINOPHEN 5-325 MG PO TABS
1.0000 | ORAL_TABLET | ORAL | Status: DC | PRN
  Administered 2017-08-10: 1 via ORAL
  Filled 2017-08-10: qty 1

## 2017-08-10 MED ORDER — HYDROMORPHONE HCL 1 MG/ML IJ SOLN
1.0000 mg | Freq: Once | INTRAMUSCULAR | Status: AC
  Administered 2017-08-10: 1 mg via INTRAVENOUS
  Filled 2017-08-10: qty 1

## 2017-08-10 MED ORDER — AMITRIPTYLINE HCL 25 MG PO TABS
75.0000 mg | ORAL_TABLET | Freq: Every day | ORAL | Status: DC
  Administered 2017-08-11 – 2017-08-18 (×9): 75 mg via ORAL
  Filled 2017-08-10 (×9): qty 3

## 2017-08-10 MED ORDER — POTASSIUM CHLORIDE CRYS ER 20 MEQ PO TBCR
40.0000 meq | EXTENDED_RELEASE_TABLET | Freq: Once | ORAL | Status: AC
  Administered 2017-08-10: 40 meq via ORAL
  Filled 2017-08-10: qty 2

## 2017-08-10 MED ORDER — SODIUM CHLORIDE 0.9 % IV BOLUS (SEPSIS)
1000.0000 mL | Freq: Once | INTRAVENOUS | Status: AC
  Administered 2017-08-10: 1000 mL via INTRAVENOUS

## 2017-08-10 MED ORDER — ACETAMINOPHEN 650 MG RE SUPP: 650.0000 mg | Freq: Four times a day (QID) | RECTAL | Status: DC | PRN

## 2017-08-10 MED ORDER — MORPHINE SULFATE (PF) 4 MG/ML IV SOLN
4.0000 mg | Freq: Once | INTRAVENOUS | Status: AC
  Administered 2017-08-10: 4 mg via INTRAVENOUS
  Filled 2017-08-10: qty 1

## 2017-08-10 MED ORDER — ENOXAPARIN SODIUM 40 MG/0.4ML ~~LOC~~ SOLN
40.0000 mg | Freq: Every day | SUBCUTANEOUS | Status: DC
  Administered 2017-08-11 – 2017-08-19 (×7): 40 mg via SUBCUTANEOUS
  Filled 2017-08-10 (×7): qty 0.4

## 2017-08-10 MED ORDER — AMLODIPINE BESYLATE 10 MG PO TABS
10.0000 mg | ORAL_TABLET | Freq: Every day | ORAL | Status: DC
  Administered 2017-08-11 – 2017-08-19 (×9): 10 mg via ORAL
  Filled 2017-08-10 (×7): qty 1
  Filled 2017-08-10 (×2): qty 2

## 2017-08-10 MED ORDER — ALBUTEROL SULFATE (2.5 MG/3ML) 0.083% IN NEBU: 3.0000 mL | INHALATION_SOLUTION | RESPIRATORY_TRACT | Status: DC | PRN

## 2017-08-10 MED ORDER — SODIUM CHLORIDE 0.9 % IV SOLN
3.0000 g | Freq: Once | INTRAVENOUS | Status: AC
  Administered 2017-08-10: 3 g via INTRAVENOUS
  Filled 2017-08-10: qty 3

## 2017-08-10 MED ORDER — IOPAMIDOL (ISOVUE-300) INJECTION 61%
75.0000 mL | Freq: Once | INTRAVENOUS | Status: AC | PRN
  Administered 2017-08-10: 75 mL via INTRAVENOUS

## 2017-08-10 MED ORDER — IOPAMIDOL (ISOVUE-300) INJECTION 61%
INTRAVENOUS | Status: AC
  Filled 2017-08-10: qty 75

## 2017-08-10 NOTE — ED Notes (Signed)
Attempting to place patient back on cardiac monitor after returning from CT patient stated "those crackpot nurses don't need to put me on that."

## 2017-08-10 NOTE — H&P (Signed)
History and Physical    KRITHIKA TOME KVQ:[id number] DOB: Nov 02, 1975 DOA: 08/10/2017  PCP: [name], MD  Patient coming from: Home  I have personally briefly reviewed patient's old medical records in [place]  Chief Complaint: Cellulitis of neck  HPI: Terri [name] is a 42 y.o. female with medical history significant of IVDU, heroin abuse.  Patient recently had dental fillings a couple of weeks ago.  Had been doing fine but then developed neck pain, swelling, erythema.  Located in lower jaw going down to chest.  Symptoms onset yesterday, worsening.   ED Course: CT shows cellulitis but no abscess.  Given multiple rounds of narcotics for pain control.   Review of Systems: As per HPI otherwise 10 point review of systems negative.   Past Medical History:  Diagnosis Date  . Bipolar disorder ([place])   . Chronic back pain   . Hypertension   . Migraine headache   . Sciatica   . Stress incontinence     Past Surgical History:  Procedure Laterality Date  . ANKLE SURGERY    . BACK SURGERY    . CESAREAN SECTION    . CHOLECYSTECTOMY N/A 02/15/2017   Procedure: LAPAROSCOPIC CHOLECYSTECTOMY;  Surgeon: Clovis Riley, MD;  Location: [facility];  Service: General;  Laterality: N/A;  . FRACTURE SURGERY    . GASTRIC BYPASS    . LAPAROSCOPIC LYSIS OF ADHESIONS  02/15/2017   Procedure: LAPAROSCOPIC LYSIS OF ADHESIONS;  Surgeon: Clovis Riley, MD;  Location: [facility];  Service: General;;  . LUMBAR [place]    . LUMBAR FUSION    . TUBAL LIGATION    . UPPER GI ENDOSCOPY N/A 02/15/2017   Procedure: UPPER GI ENDOSCOPY;  Surgeon: Clovis Riley, MD;  Location: [facility];  Service: General;  Laterality: N/A;     reports that she has been smoking cigarettes.  She has been smoking about 1.00 pack per day. she has never used smokeless tobacco. She reports that she uses drugs. Drug: IV. She reports that she does not drink alcohol.  Allergies  Allergen Reactions  . Ketorolac  Tromethamine Itching and Nausea And Vomiting  . Methocarbamol Diarrhea    Family History  Problem Relation Age of Onset  . Diabetes Mother   . Hypertension Mother   . Diabetes Father   . Hypertension Father      Prior to Admission medications   Medication Sig Start Date End Date Taking? Authorizing Provider  acetaminophen (TYLENOL) 500 MG tablet Take 1,000 mg by mouth every 6 (six) hours as needed for moderate pain.   Yes [provider]  amitriptyline (ELAVIL) 75 MG tablet Take 1 tablet (75 mg total) by mouth at bedtime. 02/11/14  Yes Patrecia Pour, NP  amLODipine (NORVASC) 10 MG tablet Take 10 mg by mouth daily.   Yes [provider]  DULoxetine (CYMBALTA) 60 MG capsule Take 60 mg by mouth daily. 12/23/14  Yes [provider]  ibuprofen (ADVIL,MOTRIN) 200 MG tablet Take 400 mg by mouth every 6 (six) hours as needed for moderate pain.   Yes [provider]  lisinopril-hydrochlorothiazide (PRINZIDE,ZESTORETIC) 10-12.5 MG tablet Take 1 tablet by mouth daily. 01/22/17  Yes [provider]  naproxen sodium (ALEVE) 220 MG tablet Take 440 mg by mouth 2 (two) times daily as needed (pain).   Yes [provider]  pantoprazole (PROTONIX) 40 MG tablet Take 40 mg by mouth 2 (two) times daily. 12/16/16  Yes [provider]  albuterol (PROVENTIL HFA;VENTOLIN HFA) 108 (90 BASE) MCG/ACT inhaler Inhale 1-2 puffs into the lungs every 4 (four) hours as needed for wheezing or shortness of breath. 03/07/15   Pisciotta, Elmyra Ricks, PA-C    Physical Exam: Vitals:   08/10/17 1837 08/10/17 1900 08/10/17 1930 08/10/17 2000  BP: (!) 138/93 (!) 134/97 110/75 115/74  Pulse: 84 76 81 82  Resp: 12 10 (!) 6 13  Temp:      TempSrc:      SpO2: 99% 100% 100% 100%  Weight:      Height:        Constitutional: NAD, calm, comfortable Eyes: PERRL, lids and conjunctivae normal ENMT: Mucous membranes are moist. Posterior pharynx clear of any exudate or  lesions.Normal dentition.  Neck: cellulitis of neck. Respiratory: clear to auscultation bilaterally, no wheezing, no crackles. Normal respiratory effort. No accessory muscle use.  Cardiovascular: Regular rate and rhythm, no murmurs / rubs / gallops. No extremity edema. 2+ pedal pulses. No carotid bruits.  Abdomen: no tenderness, no masses palpated. No hepatosplenomegaly. Bowel sounds positive.  Musculoskeletal: no clubbing / cyanosis. No joint deformity upper and lower extremities. Good ROM, no contractures. Normal muscle tone.  Skin: no rashes, lesions, ulcers. No induration Neurologic: CN 2-12 grossly intact. Sensation intact, DTR normal. Strength 5/5 in all 4.  Psychiatric: Normal judgment and insight. Alert and oriented x 3. Normal mood.    Labs on Admission: I have personally reviewed following labs and imaging studies  CBC: Recent Labs  Lab 08/10/17 1848  WBC 26.5*  NEUTROABS 22.8*  HGB 9.1*  HCT 28.2*  MCV 63.2*  PLT 778   Basic Metabolic Panel: Recent Labs  Lab 08/10/17 1848  NA 140  K 2.9*  CL 101  CO2 29  GLUCOSE 105*  BUN 9  CREATININE 0.64  CALCIUM 9.0   GFR: Estimated Creatinine Clearance: 107.2 mL/min (by C-G formula based on SCr of 0.64 mg/dL). Liver Function Tests: Recent Labs  Lab 08/10/17 1848  AST 15  ALT 9*  ALKPHOS 120  BILITOT 0.6  PROT 6.3*  ALBUMIN 3.0*   No results for input(s): LIPASE, AMYLASE in the last 168 hours. No results for input(s): AMMONIA in the last 168 hours. Coagulation Profile: No results for input(s): INR, PROTIME in the last 168 hours. Cardiac Enzymes: No results for input(s): CKTOTAL, CKMB, CKMBINDEX, TROPONINI in the last 168 hours. BNP (last 3 results) No results for input(s): PROBNP in the last 8760 hours. HbA1C: No results for input(s): HGBA1C in the last 72 hours. CBG: No results for input(s): GLUCAP in the last 168 hours. Lipid Profile: No results for input(s): CHOL, HDL, LDLCALC, TRIG, CHOLHDL, LDLDIRECT  in the last 72 hours. Thyroid Function Tests: No results for input(s): TSH, T4TOTAL, FREET4, T3FREE, THYROIDAB in the last 72 hours. Anemia Panel: No results for input(s): VITAMINB12, FOLATE, FERRITIN, TIBC, IRON, RETICCTPCT in the last 72 hours. Urine analysis:    Component Value Date/Time   COLORURINE YELLOW 02/14/2017 1558   APPEARANCEUR CLEAR 02/14/2017 1558   LABSPEC 1.010 02/14/2017 1558   PHURINE 6.0 02/14/2017 1558   GLUCOSEU NEGATIVE 02/14/2017 1558   HGBUR NEGATIVE 02/14/2017 1558   BILIRUBINUR NEGATIVE 02/14/2017 1558   KETONESUR NEGATIVE 02/14/2017 1558   PROTEINUR NEGATIVE 02/14/2017 1558   UROBILINOGEN 1.0 02/09/2015 1228   NITRITE NEGATIVE 02/14/2017 1558   LEUKOCYTESUR NEGATIVE 02/14/2017 1558    Radiological Exams on Admission: Ct Soft Tissue Neck W Contrast  Result Date: 08/10/2017 CLINICAL DATA:  Neck swelling and nausea.  Dental work 3 weeks ago. EXAM: CT NECK WITH CONTRAST TECHNIQUE: Multidetector CT imaging of the neck was performed using the standard protocol following the bolus administration of intravenous contrast. CONTRAST:  35mL ISOVUE-300 IOPAMIDOL (ISOVUE-300) INJECTION 61% COMPARISON:  CT neck 05/05/2014 FINDINGS: Pharynx and larynx: --Nasopharynx: Fossae of Rosenmuller are clear. Normal adenoid tonsils for age. --Oropharynx: Normal --Hypopharynx: Normal vallecula and pyriform sinuses. --Larynx: Normal epiglottis and pre-epiglottic space. Normal aryepiglottic and vocal folds. --Retropharyngeal space: No abscess, effusion or lymphadenopathy. Salivary glands: --Parotid: No mass lesion or inflammation. No sialolithiasis or ductal dilatation. --Submandibular: Symmetric without inflammation. No sialolithiasis or ductal dilatation. --Sublingual: Normal. No ranula or other visible lesion of the base of tongue and floor of mouth. Thyroid: Normal. Lymph nodes: Reactive bilateral level 1A nodes measure 6 mm. Left level II a nodes measure up to 8 mm. Right level IIa nodes  also measure up to 8 mm. No lower cervical adenopathy. No abnormal density nodes. Vascular: Major cervical vessels are patent. Limited intracranial: Normal. Visualized orbits: Normal. Mastoids and visualized paranasal sinuses: No fluid levels or advanced mucosal thickening. No mastoid effusion. Skeleton: No bony spinal canal stenosis. No lytic or blastic lesions. Upper chest: Clear. Other: There is inflammatory change within the left sublingual and submandibular space with inflammatory induration of the overlying subcutaneous fat and thickening of the platysma muscle. A definite source is not clear, but the most likely candidates are the empty sockets of the posterior left mandible. There is no abscess or drainable fluid collection. IMPRESSION: 1. Inflammation throughout the left sublingual and submandibular space with cellulitis of the overlying subcutaneous tissues, likely odontogenic in origin. While no definitive source is identified, the most likely candidates are the empty sockets within the posterior left mandible. 2. Reactive cervical lymphadenopathy. 3. No abscess or drainable fluid collection. Electronically Signed   By: Ulyses Jarred M.D.   On: 08/10/2017 21:42    EKG: Independently reviewed.  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 Submandibular cellulitis - 1. Unasyn 2. Dilaudid PRN pain 2. Opiate abuse - 1. Dilaudid PRN pain 2. Warned patient that opiates may be less effective in her case 3. She states she cant take toradol though.  DVT prophylaxis: Lovenox Code Status: Full Family Communication: No family in room Disposition Plan: Home likely tomorrow if improved Consults called: None Admission status: Place in 23, [place] Pager [phone]  If 7AM-7PM, please contact day team taking care of  patient [email or web address] Password [email or web address]  08/10/2017, 11:01 PM

## 2017-08-10 NOTE — ED Triage Notes (Signed)
Patient is from home and transported via [facility]. Patient is complaining of neck swelling, nausea, and vomiting x 2 since last night. EMS reports she had dental work 3 weeks ago. She took BENADRYL 50 mg last night and this morning. Also, EMS states her neck is red, swollen. Clear lungs noted by EMS. Ice pack was given by EMS. Patient does not appear in acute respiratory distress.

## 2017-08-10 NOTE — ED Notes (Signed)
ED TO INPATIENT HANDOFF REPORT  Name/Age/Gender Terri [name] 42 y.o. female  Code Status    Code Status Orders  (From admission, onward)        Start     Ordered   08/10/17 2254  Full code  Continuous     08/10/17 2257    Code Status History    Date Active Date Inactive Code Status Order ID Comments User Context   02/14/2017 15:35 02/16/2017 13:47 Full Code [id number]  Justice Deeds Inpatient   01/24/2017 20:33 01/26/2017 15:01 Full Code [id number]  Blanchie Dessert, MD ED   02/10/2014 23:23 02/11/2014 15:44 Full Code [id number]  Artis Delay, MD ED   06/21/2012 21:17 06/22/2012 09:53 Full Code [id number]  Linus Mako, PA ED      Home/SNF/Other Home  Chief Complaint neck swelling   Level of Care/Admitting Diagnosis ED Disposition    ED Disposition Condition [facility] Area: [facility] [[id number]]  Level of Care: Med-Surg [16]  Diagnosis: Odontogenic infection of jaw [8453646]  Admitting Physician: Etta Quill [[id number]]  Attending Physician: Etta Quill [[id number]]  PT Class (Do Not Modify): Observation [104]  PT Acc Code (Do Not Modify): Observation [10022]       Medical History Past Medical History:  Diagnosis Date  . Bipolar disorder ([place])   . Chronic back pain   . Hypertension   . Migraine headache   . Sciatica   . Stress incontinence     Allergies Allergies  Allergen Reactions  . Ketorolac Tromethamine Itching and Nausea And Vomiting  . Methocarbamol Diarrhea    IV Location/Drains/Wounds Patient Lines/Drains/Airways Status   Active Line/Drains/Airways    Name:   Placement date:   Placement time:   Site:   Days:   Peripheral IV 08/10/17 Right Antecubital   08/10/17    1833    Antecubital   less than 1   Incision (Closed) 02/15/17 Abdomen   02/15/17    1433     176   Incision - 4 Ports Abdomen Right;Lateral Right;Medial Umbilicus Left;Lateral   02/15/17    1323     176   Incision - 1 Port Abdomen  Mid;Upper   02/15/17    1345     176          Labs/Imaging Results for orders placed or performed during the hospital encounter of 08/10/17 (from the past 48 hour(s))  Comprehensive metabolic panel     Status: Abnormal   Collection Time: 08/10/17  6:48 PM  Result Value Ref Range   Sodium 140 135 - 145 mmol/L   Potassium 2.9 (L) 3.5 - 5.1 mmol/L   Chloride 101 101 - 111 mmol/L   CO2 29 22 - 32 mmol/L   Glucose, Bld 105 (H) 65 - 99 mg/dL   BUN 9 6 - 20 mg/dL   Creatinine, Ser 0.64 0.44 - 1.00 mg/dL   Calcium 9.0 8.9 - 10.3 mg/dL   Total Protein 6.3 (L) 6.5 - 8.1 g/dL   Albumin 3.0 (L) 3.5 - 5.0 g/dL   AST 15 15 - 41 U/L   ALT 9 (L) 14 - 54 U/L   Alkaline Phosphatase 120 38 - 126 U/L   Total Bilirubin 0.6 0.3 - 1.2 mg/dL   GFR calc non Af Amer >60 >60 mL/min   GFR calc Af Amer >60 >60 mL/min    Comment: (NOTE) The eGFR has been calculated using the CKD EPI equation. This  calculation has not been validated in all clinical situations. eGFR's persistently <60 mL/min signify possible Chronic Kidney Disease.    Anion gap 10 5 - 15    Comment: Performed at [facility], [place] [address], [place], [place] [address]  CBC WITH DIFFERENTIAL     Status: Abnormal   Collection Time: 08/10/17  6:48 PM  Result Value Ref Range   WBC 26.5 (H) 4.0 - 10.5 K/uL   RBC 4.46 3.87 - 5.11 MIL/uL   Hemoglobin 9.1 (L) 12.0 - 15.0 g/dL   HCT 28.2 (L) 36.0 - 46.0 %   MCV 63.2 (L) 78.0 - 100.0 fL   MCH 20.4 (L) 26.0 - 34.0 pg   MCHC 32.3 30.0 - 36.0 g/dL   RDW 20.1 (H) 11.5 - 15.5 %   Platelets 313 150 - 400 K/uL   Neutrophils Relative % 86 %   Lymphocytes Relative 8 %   Monocytes Relative 6 %   Eosinophils Relative 0 %   Basophils Relative 0 %   Neutro Abs 22.8 (H) 1.7 - 7.7 K/uL   Lymphs Abs 2.1 0.7 - 4.0 K/uL   Monocytes Absolute 1.6 (H) 0.1 - 1.0 K/uL   Eosinophils Absolute 0.0 0.0 - 0.7 K/uL   Basophils Absolute 0.0 0.0 - 0.1 K/uL   RBC Morphology TARGET CELLS      Comment: Performed at [facility], [place] [address], [place],  [address]  I-Stat CG4 Lactic Acid, ED  (not at  [facility])     Status: None   Collection Time: 08/10/17  7:01 PM  Result Value Ref Range   Lactic Acid, Venous 1.07 0.5 - 1.9 mmol/L   Ct Soft Tissue Neck W Contrast  Result Date: 08/10/2017 CLINICAL DATA:  Neck swelling and nausea.  Dental work 3 weeks ago. EXAM: CT NECK WITH CONTRAST TECHNIQUE: Multidetector CT imaging of the neck was performed using the standard protocol following the bolus administration of intravenous contrast. CONTRAST:  19m ISOVUE-300 IOPAMIDOL (ISOVUE-300) INJECTION 61% COMPARISON:  CT neck 05/05/2014 FINDINGS: Pharynx and larynx: --Nasopharynx: Fossae of Rosenmuller are clear. Normal adenoid tonsils for age. --Oropharynx: Normal --Hypopharynx: Normal vallecula and pyriform sinuses. --Larynx: Normal epiglottis and pre-epiglottic space. Normal aryepiglottic and vocal folds. --Retropharyngeal space: No abscess, effusion or lymphadenopathy. Salivary glands: --Parotid: No mass lesion or inflammation. No sialolithiasis or ductal dilatation. --Submandibular: Symmetric without inflammation. No sialolithiasis or ductal dilatation. --Sublingual: Normal. No ranula or other visible lesion of the base of tongue and floor of mouth. Thyroid: Normal. Lymph nodes: Reactive bilateral level 1A nodes measure 6 mm. Left level II a nodes measure up to 8 mm. Right level IIa nodes also measure up to 8 mm. No lower cervical adenopathy. No abnormal density nodes. Vascular: Major cervical vessels are patent. Limited intracranial: Normal. Visualized orbits: Normal. Mastoids and visualized paranasal sinuses: No fluid levels or advanced mucosal thickening. No mastoid effusion. Skeleton: No bony spinal canal stenosis. No lytic or blastic lesions. Upper chest: Clear. Other: There is inflammatory change within the left sublingual and submandibular space with inflammatory induration of  the overlying subcutaneous fat and thickening of the platysma muscle. A definite source is not clear, but the most likely candidates are the empty sockets of the posterior left mandible. There is no abscess or drainable fluid collection. IMPRESSION: 1. Inflammation throughout the left sublingual and submandibular space with cellulitis of the overlying subcutaneous tissues, likely odontogenic in origin. While no definitive source is identified, the most likely candidates are the  empty sockets within the posterior left mandible. 2. Reactive cervical lymphadenopathy. 3. No abscess or drainable fluid collection. Electronically Signed   By: Ulyses Jarred M.D.   On: 08/10/2017 21:42    Pending Labs Unresulted Labs (From admission, onward)   Start     Ordered   08/11/17 0500  CBC  Tomorrow morning,   R     08/10/17 2257   08/10/17 1816  Blood Culture (routine x 2)  BLOOD CULTURE X 2,   STAT     08/10/17 1818      Vitals/Pain Today's Vitals   08/10/17 2000 08/10/17 2045 08/10/17 2135 08/10/17 2257  BP: 115/74     Pulse: 82     Resp: 13     Temp:      TempSrc:      SpO2: 100%     Weight:      Height:      PainSc:  10-Worst pain ever 10-Worst pain ever 10-Worst pain ever    Isolation Precautions No active isolations  Medications Medications  oxyCODONE-acetaminophen (PERCOCET/ROXICET) 5-325 MG per tablet 1 tablet (1 tablet Oral Given 08/10/17 1432)  iopamidol (ISOVUE-300) 61 % injection (not administered)  sodium chloride 0.9 % injection (not administered)  acetaminophen (TYLENOL) tablet 1,000 mg (not administered)  albuterol (PROVENTIL) (2.5 MG/3ML) 0.083% nebulizer solution 3 mL (not administered)  amitriptyline (ELAVIL) tablet 75 mg (not administered)  amLODipine (NORVASC) tablet 10 mg (not administered)  DULoxetine (CYMBALTA) DR capsule 60 mg (not administered)  ibuprofen (ADVIL,MOTRIN) tablet 400 mg (not administered)  lisinopril-hydrochlorothiazide (PRINZIDE,ZESTORETIC) 10-12.5 MG  per tablet 1 tablet (not administered)  pantoprazole (PROTONIX) EC tablet 40 mg (not administered)  acetaminophen (TYLENOL) tablet 650 mg (not administered)    Or  acetaminophen (TYLENOL) suppository 650 mg (not administered)  ondansetron (ZOFRAN) tablet 4 mg (not administered)    Or  ondansetron (ZOFRAN) injection 4 mg (not administered)  enoxaparin (LOVENOX) injection 40 mg (not administered)  HYDROmorphone (DILAUDID) injection 1 mg (not administered)  Ampicillin-Sulbactam (UNASYN) 3 g in sodium chloride 0.9 % 100 mL IVPB (not administered)  clindamycin (CLEOCIN) IVPB 600 mg (0 mg Intravenous Stopped 08/10/17 1929)  morphine 4 MG/ML injection 4 mg (4 mg Intravenous Given 08/10/17 1854)  ondansetron (ZOFRAN) injection 4 mg (4 mg Intravenous Given 08/10/17 1854)  sodium chloride 0.9 % bolus 1,000 mL (0 mLs Intravenous Stopped 08/10/17 2043)  HYDROmorphone (DILAUDID) injection 1 mg (1 mg Intravenous Given 08/10/17 1942)  HYDROmorphone (DILAUDID) injection 1 mg (1 mg Intravenous Given 08/10/17 2057)  iopamidol (ISOVUE-300) 61 % injection 75 mL (75 mLs Intravenous Contrast Given 08/10/17 2117)  fentaNYL (SUBLIMAZE) injection 50 mcg (50 mcg Intravenous Given 08/10/17 2147)  Ampicillin-Sulbactam (UNASYN) 3 g in sodium chloride 0.9 % 100 mL IVPB (0 g Intravenous Stopped 08/10/17 2352)  potassium chloride SA (K-DUR,KLOR-CON) CR tablet 40 mEq (40 mEq Oral Given 08/10/17 2304)    Mobility Walks

## 2017-08-10 NOTE — Progress Notes (Signed)
Pharmacy Antibiotic Note  Terri [name] is a 42 y.o. female admitted on 08/10/2017 with odontogenic infection.  Pharmacy has been consulted for Unasyn dosing.  Plan: Unasyn 3 Gm IV q6h F/u scr/cultures  Height: 5\' 9"  (175.3 cm) Weight: 194 lb (88 kg) IBW/kg (Calculated) : 66.2  Temp (24hrs), Avg:99.4 F (37.4 C), Min:99.4 F (37.4 C), Max:99.4 F (37.4 C)  Recent Labs  Lab 08/10/17 1848 08/10/17 1901  WBC 26.5*  --   CREATININE 0.64  --   LATICACIDVEN  --  1.07    Estimated Creatinine Clearance: 107.2 mL/min (by C-G formula based on SCr of 0.64 mg/dL).    Allergies  Allergen Reactions  . Ketorolac Tromethamine Itching and Nausea And Vomiting  . Methocarbamol Diarrhea    Antimicrobials this admission: 3/20 Unasyn >>    >>   Dose adjustments this admission:   Microbiology results:  BCx:   UCx:    Sputum:    MRSA PCR:   Thank you for allowing pharmacy to be a part of this patient's care.  Dorrene German 08/10/2017 10:43 PM

## 2017-08-10 NOTE — ED Notes (Signed)
Attempted to call patient back to a triage room with no answer. Attempted to locate patient in the lobby and outside with no success.

## 2017-08-10 NOTE — ED Provider Notes (Signed)
[facility] DEPT Provider Note   CSN: [id number] Arrival date & time: 08/10/17  1339     History   Chief Complaint Chief Complaint  Patient presents with  . Neck Swelling    HPI SOMA BACHAND is a 42 y.o. female.  Patient is a 42 year old female with a history of hypertension, chronic back pain, opiate abuse, depression, bipolar disease who is presenting today with a 2-day history of worsening swelling and pain underneath her chin and in her throat area.  Patient states 3 weeks ago she had some fillings done but she was doing fine until yesterday when she started noticing some numbness on the left side of her lip which then progressed into swelling under her chin and in the front of her neck.  The redness was there when she woke up this morning and is going down into her chest.  It is painful to swallow but she denies any respiratory symptoms.  She has not had a cough.  She denies fever but she has been chilled today.  She is also had nausea with some episodes of vomiting over the last 24 hours.  She denies any chest pain, cough, abdominal pain.   The history is provided by the patient.    Past Medical History:  Diagnosis Date  . Bipolar disorder ([place])   . Chronic back pain   . Hypertension   . Migraine headache   . Sciatica   . Stress incontinence     Patient Active Problem List   Diagnosis Date Noted  . Cholecystitis 02/14/2017  . Opioid abuse with opioid-induced mood disorder ([place]) 01/25/2017  . Anxiety 02/11/2014  . Depression 07/12/2012    Past Surgical History:  Procedure Laterality Date  . ANKLE SURGERY    . BACK SURGERY    . CESAREAN SECTION    . CHOLECYSTECTOMY N/A 02/15/2017   Procedure: LAPAROSCOPIC CHOLECYSTECTOMY;  Surgeon: Clovis Riley, MD;  Location: [facility] ORS;  Service: General;  Laterality: N/A;  . FRACTURE SURGERY    . GASTRIC BYPASS    . LAPAROSCOPIC LYSIS OF ADHESIONS  02/15/2017   Procedure: LAPAROSCOPIC LYSIS OF  ADHESIONS;  Surgeon: Clovis Riley, MD;  Location: [facility];  Service: General;;  . LUMBAR [place]    . LUMBAR FUSION    . TUBAL LIGATION    . UPPER GI ENDOSCOPY N/A 02/15/2017   Procedure: UPPER GI ENDOSCOPY;  Surgeon: Clovis Riley, MD;  Location: [facility];  Service: General;  Laterality: N/A;    OB History    No data available       Home Medications    Prior to Admission medications   Medication Sig Start Date End Date Taking? Authorizing Provider  acetaminophen (TYLENOL) 500 MG tablet Take 1,000 mg by mouth every 6 (six) hours as needed for moderate pain.   Yes [provider]  amitriptyline (ELAVIL) 75 MG tablet Take 1 tablet (75 mg total) by mouth at bedtime. 02/11/14  Yes Patrecia Pour, NP  amLODipine (NORVASC) 10 MG tablet Take 10 mg by mouth daily.   Yes [provider]  DULoxetine (CYMBALTA) 60 MG capsule Take 60 mg by mouth daily. 12/23/14  Yes [provider]  ibuprofen (ADVIL,MOTRIN) 200 MG tablet Take 400 mg by mouth every 6 (six) hours as needed for moderate pain.   Yes [provider]  lisinopril-hydrochlorothiazide (PRINZIDE,ZESTORETIC) 10-12.5 MG tablet Take 1 tablet by mouth daily. 01/22/17  Yes [provider]  naproxen  sodium (ALEVE) 220 MG tablet Take 440 mg by mouth 2 (two) times daily as needed (pain).   Yes [provider]  pantoprazole (PROTONIX) 40 MG tablet Take 40 mg by mouth 2 (two) times daily. 12/16/16  Yes [provider]  albuterol (PROVENTIL HFA;VENTOLIN HFA) 108 (90 BASE) MCG/ACT inhaler Inhale 1-2 puffs into the lungs every 4 (four) hours as needed for wheezing or shortness of breath. 03/07/15   Pisciotta, Elmyra Ricks, PA-C  cyclobenzaprine (FLEXERIL) 10 MG tablet Take 1 tablet (10 mg total) by mouth 2 (two) times daily as needed for muscle spasms. Patient not taking: Reported on 01/24/2017 02/18/16   Gareth Morgan, MD  ferrous sulfate 325 (65 FE) MG tablet Take 1 tablet (325 mg total) by  mouth daily. Patient not taking: Reported on 02/14/2017 01/24/17   Blanchie Dessert, MD  gabapentin (NEURONTIN) 300 MG capsule Take 1 capsule (300 mg total) by mouth 3 (three) times daily. Patient not taking: Reported on 03/13/2017 02/11/14   Patrecia Pour, NP  Multiple Vitamin (MULTIVITAMIN WITH MINERALS) TABS tablet Take 1 tablet by mouth daily. Patient not taking: Reported on 03/13/2017 02/16/17   Wellington Hampshire, PA-C  oxyCODONE-acetaminophen (PERCOCET/ROXICET) 5-325 MG tablet Take 1 tablet by mouth every 6 (six) hours as needed for severe pain. Patient not taking: Reported on 03/13/2017 02/16/17   Margie Billet A, PA-C  sucralfate (CARAFATE) 1 g tablet Take 1 tablet (1 g total) by mouth 4 (four) times daily -  with meals and at bedtime. Patient not taking: Reported on 06/17/2017 02/16/17   Wellington Hampshire, PA-C    Family History Family History  Problem Relation Age of Onset  . Diabetes Mother   . Hypertension Mother   . Diabetes Father   . Hypertension Father     Social History Social History   Tobacco Use  . Smoking status: Current Every Day Smoker    Packs/day: 1.00    Types: Cigarettes  . Smokeless tobacco: Never Used  Substance Use Topics  . Alcohol use: No  . Drug use: Yes    Types: IV    Comment: Heroin     Allergies   Ketorolac tromethamine and Methocarbamol   Review of Systems Review of Systems  All other systems reviewed and are negative.    Physical Exam Updated Vital Signs BP (!) 142/99 (BP Location: Left Arm)   Pulse 99   Temp 99.4 F (37.4 C) (Oral)   Resp 18   Ht 5\' 9"  (1.753 m)   Wt 88 kg (194 lb)   SpO2 100%   BMI 28.65 kg/m   Physical Exam  Constitutional: She is oriented to person, place, and time. She appears well-developed and well-nourished. No distress.  upset  HENT:  Head: Normocephalic and atraumatic.  Mouth/Throat: Oropharynx is clear and moist and mucous membranes are normal. No oral lesions. No trismus in the jaw. No uvula  swelling. No posterior oropharyngeal erythema.  Eyes: Conjunctivae and EOM are normal. Pupils are equal, round, and reactive to light.  Neck: Normal range of motion. Neck supple. No neck rigidity. Edema and erythema present.    No tenderness with palpation underneath the tongue.  No evidence of gum swelling or dental infection.  No stridor noted.  Bilateral cervical adenopathy  Cardiovascular: Normal rate, regular rhythm and intact distal pulses.  No murmur heard. Pulmonary/Chest: Effort normal and breath sounds normal. No respiratory distress. She has no wheezes. She has no rales.  Abdominal: Soft. She exhibits no distension. There  is no tenderness. There is no rebound and no guarding.  Musculoskeletal: Normal range of motion. She exhibits no edema or tenderness.  Neurological: She is alert and oriented to person, place, and time.  Skin: Skin is warm and dry. No rash noted. No erythema.  Psychiatric: She has a normal mood and affect. Her behavior is normal.  Nursing note and vitals reviewed.    ED Treatments / Results  Labs (all labs ordered are listed, but only abnormal results are displayed) Labs Reviewed  COMPREHENSIVE METABOLIC PANEL - Abnormal; Notable for the following components:      Result Value   Potassium 2.9 (*)    Glucose, Bld 105 (*)    Total Protein 6.3 (*)    Albumin 3.0 (*)    ALT 9 (*)    All other components within normal limits  CBC WITH DIFFERENTIAL/PLATELET - Abnormal; Notable for the following components:   WBC 26.5 (*)    Hemoglobin 9.1 (*)    HCT 28.2 (*)    MCV 63.2 (*)    MCH 20.4 (*)    RDW 20.1 (*)    Neutro Abs 22.8 (*)    Monocytes Absolute 1.6 (*)    All other components within normal limits  CULTURE, BLOOD (ROUTINE X 2)  CULTURE, BLOOD (ROUTINE X 2)  I-STAT CG4 LACTIC ACID, ED    EKG  EKG Interpretation None       Radiology Ct Soft Tissue Neck W Contrast  Result Date: 08/10/2017 CLINICAL DATA:  Neck swelling and nausea.  Dental  work 3 weeks ago. EXAM: CT NECK WITH CONTRAST TECHNIQUE: Multidetector CT imaging of the neck was performed using the standard protocol following the bolus administration of intravenous contrast. CONTRAST:  73mL ISOVUE-300 IOPAMIDOL (ISOVUE-300) INJECTION 61% COMPARISON:  CT neck 05/05/2014 FINDINGS: Pharynx and larynx: --Nasopharynx: Fossae of Rosenmuller are clear. Normal adenoid tonsils for age. --Oropharynx: Normal --Hypopharynx: Normal vallecula and pyriform sinuses. --Larynx: Normal epiglottis and pre-epiglottic space. Normal aryepiglottic and vocal folds. --Retropharyngeal space: No abscess, effusion or lymphadenopathy. Salivary glands: --Parotid: No mass lesion or inflammation. No sialolithiasis or ductal dilatation. --Submandibular: Symmetric without inflammation. No sialolithiasis or ductal dilatation. --Sublingual: Normal. No ranula or other visible lesion of the base of tongue and floor of mouth. Thyroid: Normal. Lymph nodes: Reactive bilateral level 1A nodes measure 6 mm. Left level II a nodes measure up to 8 mm. Right level IIa nodes also measure up to 8 mm. No lower cervical adenopathy. No abnormal density nodes. Vascular: Major cervical vessels are patent. Limited intracranial: Normal. Visualized orbits: Normal. Mastoids and visualized paranasal sinuses: No fluid levels or advanced mucosal thickening. No mastoid effusion. Skeleton: No bony spinal canal stenosis. No lytic or blastic lesions. Upper chest: Clear. Other: There is inflammatory change within the left sublingual and submandibular space with inflammatory induration of the overlying subcutaneous fat and thickening of the platysma muscle. A definite source is not clear, but the most likely candidates are the empty sockets of the posterior left mandible. There is no abscess or drainable fluid collection. IMPRESSION: 1. Inflammation throughout the left sublingual and submandibular space with cellulitis of the overlying subcutaneous tissues,  likely odontogenic in origin. While no definitive source is identified, the most likely candidates are the empty sockets within the posterior left mandible. 2. Reactive cervical lymphadenopathy. 3. No abscess or drainable fluid collection. Electronically Signed   By: Ulyses Jarred M.D.   On: 08/10/2017 21:42    Procedures Procedures (including critical care time)  Medications Ordered in ED Medications  oxyCODONE-acetaminophen (PERCOCET/ROXICET) 5-325 MG per tablet 1 tablet (1 tablet Oral Given 08/10/17 1432)  clindamycin (CLEOCIN) IVPB 600 mg (not administered)  morphine 4 MG/ML injection 4 mg (not administered)  ondansetron (ZOFRAN) injection 4 mg (not administered)  sodium chloride 0.9 % bolus 1,000 mL (not administered)     Initial Impression / Assessment and Plan / ED Course  I have reviewed the triage vital signs and the nursing notes.  Pertinent labs & imaging results that were available during my care of the patient were reviewed by me and considered in my medical decision making (see chart for details).     Patient is a 42 year old female presenting today with significant swelling, erythema and induration of her anterior neck and submental area.  Patient is not stridorous and at this time is tolerating her secretions.  However concern for abscess, infected salivary gland however less likely as patient has no tenderness inside her mouth.  There is no obvious dental infections.  Lower suspicion for epiglottitis or retropharyngeal abscess.  Sepsis order set was initiated.  Patient given IV Clinda and IV fluids.  Also pain and nausea control.  10:34 PM White count is significant for leukocytosis of 26,000, mild hypokalemia of 2.9 but no normal renal function and lactate.  Patient given IV Clinda also covered with IV Unasyn.  CT negative for drainable abscess at this time is just cellulitis.  Will admit for ongoing IV antibiotics   Final Clinical Impressions(s) / ED Diagnoses   Final  diagnoses:  Cellulitis of neck    ED Discharge Orders    None       Blanchie Dessert, MD 08/10/17 2234

## 2017-08-11 ENCOUNTER — Other Ambulatory Visit: Payer: Self-pay

## 2017-08-11 ENCOUNTER — Encounter (HOSPITAL_COMMUNITY): Payer: Self-pay

## 2017-08-11 DIAGNOSIS — K122 Cellulitis and abscess of mouth: Secondary | ICD-10-CM | POA: Diagnosis not present

## 2017-08-11 DIAGNOSIS — I10 Essential (primary) hypertension: Secondary | ICD-10-CM | POA: Diagnosis not present

## 2017-08-11 LAB — CBC
HEMATOCRIT: 25.8 % — AB (ref 36.0–46.0)
Hemoglobin: 8.3 g/dL — ABNORMAL LOW (ref 12.0–15.0)
MCH: 20.4 pg — AB (ref 26.0–34.0)
MCHC: 32.2 g/dL (ref 30.0–36.0)
MCV: 63.5 fL — ABNORMAL LOW (ref 78.0–100.0)
Platelets: 293 10*3/uL (ref 150–400)
RBC: 4.06 MIL/uL (ref 3.87–5.11)
RDW: 20.1 % — ABNORMAL HIGH (ref 11.5–15.5)
WBC: 19.1 10*3/uL — ABNORMAL HIGH (ref 4.0–10.5)

## 2017-08-11 MED ORDER — PREDNISONE 20 MG PO TABS
20.0000 mg | ORAL_TABLET | Freq: Every day | ORAL | Status: DC
  Administered 2017-08-11 – 2017-08-15 (×5): 20 mg via ORAL
  Filled 2017-08-11 (×5): qty 1

## 2017-08-11 MED ORDER — HYDROCHLOROTHIAZIDE 12.5 MG PO CAPS
12.5000 mg | ORAL_CAPSULE | Freq: Every day | ORAL | Status: DC
  Administered 2017-08-11 – 2017-08-19 (×9): 12.5 mg via ORAL
  Filled 2017-08-11 (×9): qty 1

## 2017-08-11 MED ORDER — OXYCODONE-ACETAMINOPHEN 5-325 MG PO TABS
1.0000 | ORAL_TABLET | ORAL | Status: DC | PRN
  Administered 2017-08-11: 1 via ORAL
  Administered 2017-08-12 – 2017-08-13 (×7): 2 via ORAL
  Filled 2017-08-11: qty 2
  Filled 2017-08-11: qty 1
  Filled 2017-08-11 (×6): qty 2

## 2017-08-11 MED ORDER — VANCOMYCIN HCL IN DEXTROSE 1-5 GM/200ML-% IV SOLN
1000.0000 mg | Freq: Two times a day (BID) | INTRAVENOUS | Status: DC
  Administered 2017-08-12 – 2017-08-17 (×12): 1000 mg via INTRAVENOUS
  Filled 2017-08-11 (×12): qty 200

## 2017-08-11 MED ORDER — LISINOPRIL 10 MG PO TABS
10.0000 mg | ORAL_TABLET | Freq: Every day | ORAL | Status: DC
  Administered 2017-08-11 – 2017-08-19 (×9): 10 mg via ORAL
  Filled 2017-08-11 (×9): qty 1

## 2017-08-11 MED ORDER — NICOTINE 21 MG/24HR TD PT24
21.0000 mg | MEDICATED_PATCH | Freq: Every day | TRANSDERMAL | Status: DC
  Administered 2017-08-11 – 2017-08-18 (×10): 21 mg via TRANSDERMAL
  Filled 2017-08-11 (×10): qty 1

## 2017-08-11 MED ORDER — HYDROMORPHONE HCL 1 MG/ML IJ SOLN
1.0000 mg | INTRAMUSCULAR | Status: DC | PRN
  Administered 2017-08-11 – 2017-08-12 (×4): 1 mg via INTRAVENOUS
  Filled 2017-08-11 (×4): qty 1

## 2017-08-11 MED ORDER — ACETAMINOPHEN 500 MG PO TABS
1000.0000 mg | ORAL_TABLET | Freq: Four times a day (QID) | ORAL | Status: DC | PRN
  Administered 2017-08-12: 1000 mg via ORAL
  Filled 2017-08-11: qty 2

## 2017-08-11 MED ORDER — VANCOMYCIN HCL 10 G IV SOLR
1750.0000 mg | Freq: Once | INTRAVENOUS | Status: AC
  Administered 2017-08-11: 1750 mg via INTRAVENOUS
  Filled 2017-08-11: qty 1750

## 2017-08-11 NOTE — Progress Notes (Signed)
Patient very upset.  Screaming, yelling at this nurse, and thrashing in pain.  She is upset that pain medicine not working.  Md notified.  Nursing management notified.

## 2017-08-11 NOTE — Progress Notes (Signed)
Pharmacy Antibiotic Note  Terri [name] is a 42 y.o. female admitted on 08/10/2017 with odontogenic infection.  Pharmacy has been consulted for Unasyn dosing. Now consulted to add vancomycin.  Plan: Vancomycin 1750mg  IV x 1, then 1g IV q12h for estimated AUC 494 Check levels at steady state, goal AUC 400-500 Continue Unasyn 3g IV q6h Follow up renal function & cultures, clinical course  Height: 5\' 9"  (175.3 cm) Weight: 194 lb (88 kg) IBW/kg (Calculated) : 66.2  Temp (24hrs), Avg:99 F (37.2 C), Min:98.6 F (37 C), Max:99.4 F (37.4 C)  Recent Labs  Lab 08/10/17 1848 08/10/17 1901 08/11/17 0600  WBC 26.5*  --  19.1*  CREATININE 0.64  --   --   LATICACIDVEN  --  1.07  --     Estimated Creatinine Clearance: 107.2 mL/min (by C-G formula based on SCr of 0.64 mg/dL).    Allergies  Allergen Reactions  . Ketorolac Tromethamine Itching and Nausea And Vomiting  . Methocarbamol Diarrhea    Antimicrobials this admission: 3/20 Clinda x 1 3/20 Unasyn >>  3/21 Vancomycin >>  Dose adjustments this admission:  Microbiology results: 3/21 BCx: sent  Thank you for allowing pharmacy to be a part of this patient's care.  Peggyann Juba, PharmD, BCPS Pager: [phone] 08/11/2017 11:02 AM

## 2017-08-11 NOTE — Progress Notes (Signed)
PROGRESS NOTE [facility]   Terri [name]   GYJ:[id number] DOB: [birth year]  DOA: 08/10/2017 PCP: [name], MD   Brief Narrative:  Terri [name] is a 42 year old female with medical history significant for hypertension, bipolar disorder and IV drug user presented  to the emergency department complaining of neck pain, swelling and erythema.  Upon ED evaluation CT scan showed neck cellulitis with no abscess.  Patient was admitted for working diagnosis of neck cellulitis and pain management.  Subjective: Patient seen and examined, reported that pain, erythema and swelling has increased.  No other concerns.  Patient reported that Dilaudid is not helping and need something stronger for pain.  Assessment & Plan: Cellulitis of the neck Patient was given a dose of clindamycin in the ED, subsequently started on Unasyn will cont and will add vancomycin, high risk for MRSA given IV drug use. Pain will be hard to control as patient is a heroin abuser, will use Dilaudid 1 mg q. 3 as needed for severe pain, Percocet 1-2 tabs every 4 as needed for moderate pain and Tylenol thousand milligrams every 6 as needed for mild pain.  Hypertension BP stable, continue lisinopril, HCTZ and amlodipine  Anxiety/depression Patient on Elavil and Cymbalta will continue   DVT prophylaxis: Lovenox Code Status: Full code Family Communication: None at bedside Disposition Plan: Home in 1-2 days  Consultants:   None  Procedures:   None  Antimicrobials: Anti-infectives (From admission, onward)   Start     Dose/Rate Route Frequency Ordered Stop   08/12/17 0200  vancomycin (VANCOCIN) IVPB 1000 mg/200 mL premix     1,000 mg 200 mL/hr over 60 Minutes Intravenous Every 12 hours 08/11/17 1124     08/11/17 1130  vancomycin (VANCOCIN) 1,750 mg in sodium chloride 0.9 % 500 mL IVPB     1,750 mg 250 mL/hr over 120 Minutes Intravenous  Once 08/11/17 1045     08/11/17 0600  Ampicillin-Sulbactam (UNASYN) 3 g  in sodium chloride 0.9 % 100 mL IVPB     3 g 200 mL/hr over 30 Minutes Intravenous Every 6 hours 08/10/17 2257     08/10/17 2245  Ampicillin-Sulbactam (UNASYN) 3 g in sodium chloride 0.9 % 100 mL IVPB     3 g 200 mL/hr over 30 Minutes Intravenous  Once 08/10/17 2231 08/10/17 2352   08/10/17 1830  clindamycin (CLEOCIN) IVPB 600 mg     600 mg 100 mL/hr over 30 Minutes Intravenous  Once 08/10/17 1818 08/10/17 1929        Objective: Vitals:   08/11/17 0024 08/11/17 0331 08/11/17 1005 08/11/17 1348  BP: 123/88 125/78 (!) 133/92 (!) 158/98  Pulse:  70 75 89  Resp: 17 18  20   Temp: 99 F (37.2 C) 98.6 F (37 C)  98.7 F (37.1 C)  TempSrc: Oral Oral  Oral  SpO2: 99% 97%  98%  Weight:      Height:        Intake/Output Summary (Last 24 hours) at 08/11/2017 1511 Last data filed at 08/11/2017 0800 Gross per 24 hour  Intake 1490 ml  Output -  Net 1490 ml   Filed Weights   08/10/17 1427  Weight: 88 kg (194 lb)    Examination:  General exam: Appears calm and comfortable  HEENT: Swelling lower mandible and midline neck, erythema extending to upper chest, mild trismus Respiratory system: Clear to auscultation. No wheezes,crackle or rhonchi Cardiovascular system: S1 & S2 heard, RRR. No JVD, murmurs, rubs or gallops  Gastrointestinal system: Abdomen is nondistended, soft and nontender.  Central nervous system: Alert and oriented. No focal neurological deficits. Extremities: No pedal edema. Skin: Neck erythema compatible with cellulitis  Data Reviewed: I have personally reviewed following labs and imaging studies  CBC: Recent Labs  Lab 08/10/17 1848 08/11/17 0600  WBC 26.5* 19.1*  NEUTROABS 22.8*  --   HGB 9.1* 8.3*  HCT 28.2* 25.8*  MCV 63.2* 63.5*  PLT 313 151   Basic Metabolic Panel: Recent Labs  Lab 08/10/17 1848  NA 140  K 2.9*  CL 101  CO2 29  GLUCOSE 105*  BUN 9  CREATININE 0.64  CALCIUM 9.0   GFR: Estimated Creatinine Clearance: 107.2 mL/min (by C-G  formula based on SCr of 0.64 mg/dL). Liver Function Tests: Recent Labs  Lab 08/10/17 1848  AST 15  ALT 9*  ALKPHOS 120  BILITOT 0.6  PROT 6.3*  ALBUMIN 3.0*   No results for input(s): LIPASE, AMYLASE in the last 168 hours. No results for input(s): AMMONIA in the last 168 hours. Coagulation Profile: No results for input(s): INR, PROTIME in the last 168 hours. Cardiac Enzymes: No results for input(s): CKTOTAL, CKMB, CKMBINDEX, TROPONINI in the last 168 hours. BNP (last 3 results) No results for input(s): PROBNP in the last 8760 hours. HbA1C: No results for input(s): HGBA1C in the last 72 hours. CBG: No results for input(s): GLUCAP in the last 168 hours. Lipid Profile: No results for input(s): CHOL, HDL, LDLCALC, TRIG, CHOLHDL, LDLDIRECT in the last 72 hours. Thyroid Function Tests: No results for input(s): TSH, T4TOTAL, FREET4, T3FREE, THYROIDAB in the last 72 hours. Anemia Panel: No results for input(s): VITAMINB12, FOLATE, FERRITIN, TIBC, IRON, RETICCTPCT in the last 72 hours. Sepsis Labs: Recent Labs  Lab 08/10/17 1901  LATICACIDVEN 1.07    Recent Results (from the past 240 hour(s))  Blood Culture (routine x 2)     Status: None (Preliminary result)   Collection Time: 08/10/17  6:33 PM  Result Value Ref Range Status   Specimen Description   Final    BLOOD RIGHT ANTECUBITAL Performed at [facility], [place] [address], [place], [place] [address]    Special Requests   Final    BOTTLES DRAWN AEROBIC AND ANAEROBIC Blood Culture adequate volume Performed at [facility] [address], [place], [place] [address]    Culture   Final    NO GROWTH < 24 HOURS Performed at [facility] [address], [place], [place] [address]    Report Status PENDING  Incomplete      Radiology Studies: Ct Soft Tissue Neck W Contrast  Result Date: 08/10/2017 CLINICAL DATA:  Neck swelling and nausea.  Dental work 3 weeks ago. EXAM: CT NECK  WITH CONTRAST TECHNIQUE: Multidetector CT imaging of the neck was performed using the standard protocol following the bolus administration of intravenous contrast. CONTRAST:  8mL ISOVUE-300 IOPAMIDOL (ISOVUE-300) INJECTION 61% COMPARISON:  CT neck 05/05/2014 FINDINGS: Pharynx and larynx: --Nasopharynx: Fossae of Rosenmuller are clear. Normal adenoid tonsils for age. --Oropharynx: Normal --Hypopharynx: Normal vallecula and pyriform sinuses. --Larynx: Normal epiglottis and pre-epiglottic space. Normal aryepiglottic and vocal folds. --Retropharyngeal space: No abscess, effusion or lymphadenopathy. Salivary glands: --Parotid: No mass lesion or inflammation. No sialolithiasis or ductal dilatation. --Submandibular: Symmetric without inflammation. No sialolithiasis or ductal dilatation. --Sublingual: Normal. No ranula or other visible lesion of the base of tongue and floor of mouth. Thyroid: Normal. Lymph nodes: Reactive bilateral level 1A nodes measure 6 mm.  Left level II a nodes measure up to 8 mm. Right level IIa nodes also measure up to 8 mm. No lower cervical adenopathy. No abnormal density nodes. Vascular: Major cervical vessels are patent. Limited intracranial: Normal. Visualized orbits: Normal. Mastoids and visualized paranasal sinuses: No fluid levels or advanced mucosal thickening. No mastoid effusion. Skeleton: No bony spinal canal stenosis. No lytic or blastic lesions. Upper chest: Clear. Other: There is inflammatory change within the left sublingual and submandibular space with inflammatory induration of the overlying subcutaneous fat and thickening of the platysma muscle. A definite source is not clear, but the most likely candidates are the empty sockets of the posterior left mandible. There is no abscess or drainable fluid collection. IMPRESSION: 1. Inflammation throughout the left sublingual and submandibular space with cellulitis of the overlying subcutaneous tissues, likely odontogenic in origin. While  no definitive source is identified, the most likely candidates are the empty sockets within the posterior left mandible. 2. Reactive cervical lymphadenopathy. 3. No abscess or drainable fluid collection. Electronically Signed   By: Ulyses Jarred M.D.   On: 08/10/2017 21:42      Scheduled Meds: . amitriptyline  75 mg Oral QHS  . amLODipine  10 mg Oral Daily  . DULoxetine  60 mg Oral Daily  . enoxaparin (LOVENOX) injection  40 mg Subcutaneous Daily  . lisinopril  10 mg Oral Daily   And  . hydrochlorothiazide  12.5 mg Oral Daily  . nicotine  21 mg Transdermal Daily  . pantoprazole  40 mg Oral BID  . predniSONE  20 mg Oral Q breakfast   Continuous Infusions: . ampicillin-sulbactam (UNASYN) IV 3 g (08/11/17 0616)  . vancomycin 1,750 mg (08/11/17 1350)  . [START ON 08/12/2017] vancomycin       LOS: 0 days    Time spent: Total of 25 minutes spent with pt, greater than 50% of which was spent in discussion of  treatment, counseling and coordination of care   Chipper Oman, MD Pager: Text Page via [email or web address]   If 7PM-7AM, please contact night-coverage [email or web address] 08/11/2017, 3:11 PM   Note - This record has been created using [employer]. Chart creation errors have been sought, but may not always have been located. Such creation errors do not reflect on the standard of medical care.

## 2017-08-12 ENCOUNTER — Observation Stay (HOSPITAL_COMMUNITY): Payer: Medicare Other

## 2017-08-12 DIAGNOSIS — K08409 Partial loss of teeth, unspecified cause, unspecified class: Secondary | ICD-10-CM | POA: Diagnosis not present

## 2017-08-12 DIAGNOSIS — F1110 Opioid abuse, uncomplicated: Secondary | ICD-10-CM | POA: Diagnosis present

## 2017-08-12 DIAGNOSIS — I10 Essential (primary) hypertension: Secondary | ICD-10-CM | POA: Diagnosis present

## 2017-08-12 DIAGNOSIS — T402X5A Adverse effect of other opioids, initial encounter: Secondary | ICD-10-CM | POA: Diagnosis present

## 2017-08-12 DIAGNOSIS — F1114 Opioid abuse with opioid-induced mood disorder: Secondary | ICD-10-CM | POA: Diagnosis not present

## 2017-08-12 DIAGNOSIS — K047 Periapical abscess without sinus: Secondary | ICD-10-CM | POA: Diagnosis not present

## 2017-08-12 DIAGNOSIS — L0211 Cutaneous abscess of neck: Secondary | ICD-10-CM | POA: Diagnosis present

## 2017-08-12 DIAGNOSIS — K0889 Other specified disorders of teeth and supporting structures: Secondary | ICD-10-CM | POA: Diagnosis not present

## 2017-08-12 DIAGNOSIS — M272 Inflammatory conditions of jaws: Secondary | ICD-10-CM | POA: Diagnosis present

## 2017-08-12 DIAGNOSIS — F319 Bipolar disorder, unspecified: Secondary | ICD-10-CM | POA: Diagnosis present

## 2017-08-12 DIAGNOSIS — E876 Hypokalemia: Secondary | ICD-10-CM | POA: Diagnosis present

## 2017-08-12 DIAGNOSIS — F419 Anxiety disorder, unspecified: Secondary | ICD-10-CM | POA: Diagnosis present

## 2017-08-12 DIAGNOSIS — L03221 Cellulitis of neck: Secondary | ICD-10-CM | POA: Diagnosis present

## 2017-08-12 DIAGNOSIS — F17210 Nicotine dependence, cigarettes, uncomplicated: Secondary | ICD-10-CM | POA: Diagnosis present

## 2017-08-12 DIAGNOSIS — K122 Cellulitis and abscess of mouth: Secondary | ICD-10-CM | POA: Diagnosis not present

## 2017-08-12 DIAGNOSIS — R1310 Dysphagia, unspecified: Secondary | ICD-10-CM | POA: Diagnosis not present

## 2017-08-12 DIAGNOSIS — M869 Osteomyelitis, unspecified: Secondary | ICD-10-CM | POA: Diagnosis present

## 2017-08-12 LAB — BASIC METABOLIC PANEL
Anion gap: 9 (ref 5–15)
BUN: 7 mg/dL (ref 6–20)
CHLORIDE: 103 mmol/L (ref 101–111)
CO2: 28 mmol/L (ref 22–32)
CREATININE: 0.47 mg/dL (ref 0.44–1.00)
Calcium: 8.8 mg/dL — ABNORMAL LOW (ref 8.9–10.3)
GFR calc non Af Amer: >60 mL/min (ref >60)
Glucose, Bld: 78 mg/dL (ref 65–99)
POTASSIUM: 3 mmol/L — AB (ref 3.5–5.1)
SODIUM: 140 mmol/L (ref 135–145)

## 2017-08-12 LAB — CBC WITH DIFFERENTIAL/PLATELET
BASOS ABS: 0 10*3/uL (ref 0.0–0.1)
Basophils Relative: 0 %
Eosinophils Absolute: 0.2 10*3/uL (ref 0.0–0.7)
Eosinophils Relative: 1 %
HCT: 26.5 % — ABNORMAL LOW (ref 36.0–46.0)
Hemoglobin: 8.4 g/dL — ABNORMAL LOW (ref 12.0–15.0)
LYMPHS ABS: 3.2 10*3/uL (ref 0.7–4.0)
Lymphocytes Relative: 19 %
MCH: 20.2 pg — ABNORMAL LOW (ref 26.0–34.0)
MCHC: 31.7 g/dL (ref 30.0–36.0)
MCV: 63.9 fL — ABNORMAL LOW (ref 78.0–100.0)
MONO ABS: 0.7 10*3/uL (ref 0.1–1.0)
MONOS PCT: 4 %
NEUTROS PCT: 76 %
Neutro Abs: 12.8 10*3/uL — ABNORMAL HIGH (ref 1.7–7.7)
Platelets: 342 10*3/uL (ref 150–400)
RBC: 4.15 MIL/uL (ref 3.87–5.11)
RDW: 20.2 % — AB (ref 11.5–15.5)
WBC: 16.9 10*3/uL — AB (ref 4.0–10.5)

## 2017-08-12 LAB — SURGICAL PCR SCREEN
MRSA, PCR: NEGATIVE
Staphylococcus aureus: NEGATIVE

## 2017-08-12 LAB — MAGNESIUM: MAGNESIUM: 1.9 mg/dL (ref 1.7–2.4)

## 2017-08-12 MED ORDER — MUPIROCIN 2 % EX OINT
1.0000 "application " | TOPICAL_OINTMENT | Freq: Two times a day (BID) | CUTANEOUS | Status: DC
  Administered 2017-08-12: 1 via NASAL
  Filled 2017-08-12: qty 22

## 2017-08-12 MED ORDER — POTASSIUM CHLORIDE CRYS ER 20 MEQ PO TBCR
20.0000 meq | EXTENDED_RELEASE_TABLET | ORAL | Status: AC
  Administered 2017-08-12 (×2): 20 meq via ORAL
  Filled 2017-08-12 (×2): qty 1

## 2017-08-12 MED ORDER — HYDROMORPHONE HCL 1 MG/ML IJ SOLN
1.0000 mg | INTRAMUSCULAR | Status: DC | PRN
  Administered 2017-08-12 – 2017-08-13 (×6): 1 mg via INTRAVENOUS
  Administered 2017-08-13: 0.5 mg via INTRAVENOUS
  Filled 2017-08-12 (×7): qty 1

## 2017-08-12 MED ORDER — POTASSIUM CHLORIDE 10 MEQ/100ML IV SOLN: 10.0000 meq | INTRAVENOUS | Status: DC

## 2017-08-12 MED ORDER — IOPAMIDOL (ISOVUE-300) INJECTION 61%
75.0000 mL | Freq: Once | INTRAVENOUS | Status: AC | PRN
  Administered 2017-08-12: 75 mL via INTRAVENOUS

## 2017-08-12 MED ORDER — DOCUSATE SODIUM 100 MG PO CAPS
100.0000 mg | ORAL_CAPSULE | Freq: Two times a day (BID) | ORAL | Status: DC
  Administered 2017-08-12 – 2017-08-19 (×14): 100 mg via ORAL
  Filled 2017-08-12 (×14): qty 1

## 2017-08-12 MED ORDER — POLYETHYLENE GLYCOL 3350 17 G PO PACK
17.0000 g | PACK | Freq: Every day | ORAL | Status: DC
  Administered 2017-08-12 – 2017-08-19 (×6): 17 g via ORAL
  Filled 2017-08-12 (×7): qty 1

## 2017-08-12 MED ORDER — IOPAMIDOL (ISOVUE-300) INJECTION 61%
INTRAVENOUS | Status: AC
  Filled 2017-08-12: qty 75

## 2017-08-12 MED ORDER — LIP MEDEX EX OINT
TOPICAL_OINTMENT | CUTANEOUS | Status: DC | PRN
  Administered 2017-08-13: 1 via TOPICAL
  Filled 2017-08-12: qty 7

## 2017-08-12 NOTE — Consult Note (Addendum)
Reason for Consult: Submandibular swelling Referring Physician: Hospitalist  Terri [name] is an 42 y.o. female.  HPI: 42 year old female who had some dental work done about one month ago consisting of some fillings of her lower front teeth.  Early this week, she developed numbness of her left lower lip.  The next day, her neck swelled under her chin with pain.  It worsened through the day so she came to the ER.  A neck CT demonstrated soft tissue infection and she was admitted and treated with IV Unasyn and vancomycin.  Surrounding swelling has improved a bit but pain has worsened.  A repeat CT today demonstrated a fluid collection so surgical consultation was requested.  Past Medical History:  Diagnosis Date  . Bipolar disorder ([place])   . Chronic back pain   . Hypertension   . Migraine headache   . Sciatica   . Stress incontinence     Past Surgical History:  Procedure Laterality Date  . ANKLE SURGERY    . BACK SURGERY    . CESAREAN SECTION    . CHOLECYSTECTOMY N/A 02/15/2017   Procedure: LAPAROSCOPIC CHOLECYSTECTOMY;  Surgeon: Clovis Riley, MD;  Location: [facility];  Service: General;  Laterality: N/A;  . FRACTURE SURGERY    . GASTRIC BYPASS    . LAPAROSCOPIC LYSIS OF ADHESIONS  02/15/2017   Procedure: LAPAROSCOPIC LYSIS OF ADHESIONS;  Surgeon: Clovis Riley, MD;  Location: [facility];  Service: General;;  . LUMBAR [place]    . LUMBAR FUSION    . TUBAL LIGATION    . UPPER GI ENDOSCOPY N/A 02/15/2017   Procedure: UPPER GI ENDOSCOPY;  Surgeon: Clovis Riley, MD;  Location: [facility];  Service: General;  Laterality: N/A;    Family History  Problem Relation Age of Onset  . Diabetes Mother   . Hypertension Mother   . Diabetes Father   . Hypertension Father     Social History:  reports that she has been smoking cigarettes.  She has been smoking about 1.00 pack per day. She has never used smokeless tobacco. She reports that she has current or past drug history. Drug: IV. She  reports that she does not drink alcohol.  Allergies:  Allergies  Allergen Reactions  . Ketorolac Tromethamine Itching and Nausea And Vomiting  . Methocarbamol Diarrhea    Medications: I have reviewed the patient's current medications.  Results for orders placed or performed during the hospital encounter of 08/10/17 (from the past 48 hour(s))  Blood Culture (routine x 2)     Status: None (Preliminary result)   Collection Time: 08/11/17  6:00 AM  Result Value Ref Range   Specimen Description      BLOOD LEFT HAND Performed at [facility] [address], [place], [place] [address]    Special Requests      BOTTLES DRAWN AEROBIC AND ANAEROBIC Blood Culture adequate volume Performed at [facility] [address], [place], [place] [address]    Culture      NO GROWTH 1 DAY Performed at [facility] [address], [place], [place] [address]    Report Status PENDING   CBC     Status: Abnormal   Collection Time: 08/11/17  6:00 AM  Result Value Ref Range   WBC 19.1 (H) 4.0 - 10.5 K/uL   RBC 4.06 3.87 - 5.11 MIL/uL   Hemoglobin 8.3 (L) 12.0 - 15.0 g/dL   HCT 25.8 (L)  36.0 - 46.0 %   MCV 63.5 (L) 78.0 - 100.0 fL   MCH 20.4 (L) 26.0 - 34.0 pg   MCHC 32.2 30.0 - 36.0 g/dL   RDW 20.1 (H) 11.5 - 15.5 %   Platelets 293 150 - 400 K/uL    Comment: Performed at [facility], [place] [address] [place], [place] [address]  Basic metabolic panel     Status: Abnormal   Collection Time: 08/12/17  6:07 AM  Result Value Ref Range   Sodium 140 135 - 145 mmol/L   Potassium 3.0 (L) 3.5 - 5.1 mmol/L   Chloride 103 101 - 111 mmol/L   CO2 28 22 - 32 mmol/L   Glucose, Bld 78 65 - 99 mg/dL   BUN 7 6 - 20 mg/dL   Creatinine, Ser 0.47 0.44 - 1.00 mg/dL   Calcium 8.8 (L) 8.9 - 10.3 mg/dL   GFR calc non Af Amer >60 >60 mL/min   GFR calc Af Amer >60 >60 mL/min    Comment: (NOTE) The eGFR has been calculated using the CKD EPI  equation. This calculation has not been validated in all clinical situations. eGFR's persistently <60 mL/min signify possible Chronic Kidney Disease.    Anion gap 9 5 - 15    Comment: Performed at [facility], [place] [address], [place], [place] [address]  CBC with Differential/Platelet     Status: Abnormal   Collection Time: 08/12/17  6:07 AM  Result Value Ref Range   WBC 16.9 (H) 4.0 - 10.5 K/uL   RBC 4.15 3.87 - 5.11 MIL/uL   Hemoglobin 8.4 (L) 12.0 - 15.0 g/dL   HCT 26.5 (L) 36.0 - 46.0 %   MCV 63.9 (L) 78.0 - 100.0 fL   MCH 20.2 (L) 26.0 - 34.0 pg   MCHC 31.7 30.0 - 36.0 g/dL   RDW 20.2 (H) 11.5 - 15.5 %   Platelets 342 150 - 400 K/uL   Neutrophils Relative % 76 %   Lymphocytes Relative 19 %   Monocytes Relative 4 %   Eosinophils Relative 1 %   Basophils Relative 0 %   Neutro Abs 12.8 (H) 1.7 - 7.7 K/uL   Lymphs Abs 3.2 0.7 - 4.0 K/uL   Monocytes Absolute 0.7 0.1 - 1.0 K/uL   Eosinophils Absolute 0.2 0.0 - 0.7 K/uL   Basophils Absolute 0.0 0.0 - 0.1 K/uL   RBC Morphology TARGET CELLS     Comment: Performed at [facility], [place] [address], [place], [place] [address]  Magnesium     Status: None   Collection Time: 08/12/17  6:07 AM  Result Value Ref Range   Magnesium 1.9 1.7 - 2.4 mg/dL    Comment: Performed at [facility], [place] [address], [place], [place] [address]    Ct Soft Tissue Neck W Contrast  Result Date: 08/10/2017 CLINICAL DATA:  Neck swelling and nausea.  Dental work 3 weeks ago. EXAM: CT NECK WITH CONTRAST TECHNIQUE: Multidetector CT imaging of the neck was performed using the standard protocol following the bolus administration of intravenous contrast. CONTRAST:  37m ISOVUE-300 IOPAMIDOL (ISOVUE-300) INJECTION 61% COMPARISON:  CT neck 05/05/2014 FINDINGS: Pharynx and larynx: --Nasopharynx: Fossae of Rosenmuller are clear. Normal adenoid tonsils for age. --Oropharynx: Normal --Hypopharynx: Normal vallecula  and pyriform sinuses. --Larynx: Normal epiglottis and pre-epiglottic space. Normal aryepiglottic and vocal folds. --Retropharyngeal space: No abscess, effusion or lymphadenopathy. Salivary glands: --Parotid: No mass lesion or inflammation. No sialolithiasis or  ductal dilatation. --Submandibular: Symmetric without inflammation. No sialolithiasis or ductal dilatation. --Sublingual: Normal. No ranula or other visible lesion of the base of tongue and floor of mouth. Thyroid: Normal. Lymph nodes: Reactive bilateral level 1A nodes measure 6 mm. Left level II a nodes measure up to 8 mm. Right level IIa nodes also measure up to 8 mm. No lower cervical adenopathy. No abnormal density nodes. Vascular: Major cervical vessels are patent. Limited intracranial: Normal. Visualized orbits: Normal. Mastoids and visualized paranasal sinuses: No fluid levels or advanced mucosal thickening. No mastoid effusion. Skeleton: No bony spinal canal stenosis. No lytic or blastic lesions. Upper chest: Clear. Other: There is inflammatory change within the left sublingual and submandibular space with inflammatory induration of the overlying subcutaneous fat and thickening of the platysma muscle. A definite source is not clear, but the most likely candidates are the empty sockets of the posterior left mandible. There is no abscess or drainable fluid collection. IMPRESSION: 1. Inflammation throughout the left sublingual and submandibular space with cellulitis of the overlying subcutaneous tissues, likely odontogenic in origin. While no definitive source is identified, the most likely candidates are the empty sockets within the posterior left mandible. 2. Reactive cervical lymphadenopathy. 3. No abscess or drainable fluid collection. Electronically Signed   By: Ulyses Jarred M.D.   On: 08/10/2017 21:42   Ct Maxillofacial W Contrast  Result Date: 08/12/2017 CLINICAL DATA:  Cellulitis neck. IV drug abuse. Dental work several weeks ago. EXAM: CT  MAXILLOFACIAL WITH CONTRAST TECHNIQUE: Multidetector CT imaging of the maxillofacial structures was performed with intravenous contrast. Multiplanar CT image reconstructions were also generated. CONTRAST:  40m ISOVUE-300 IOPAMIDOL (ISOVUE-300) INJECTION 61% COMPARISON:  CT neck 08/10/2017 FINDINGS: Osseous: Multiple dental extractions, which appear recent. No evidence of osteomyelitis in the mandible or maxilla. Visualized cervical spine negative. Orbits: Normal soft tissues of the orbit. Sinuses: Paranasal sinuses clear.  Mastoid sinus clear Soft tissues: Progressive soft tissue swelling involving the chin. There is progressive subcutaneous fluid some which appears to be organized and could represent developing subcutaneous abscess. This component measures approximately 2.4 cm in diameter. To the left of midline, there is a rim enhancing fluid collection measuring 24 x 11 mm just below the left mylohyoid muscle and perhaps extending into the muscle. This also is suspicious for abscess. Small gas bubbles are present in the soft tissues. There is diffuse edema and skin thickening involving the submandibular soft tissues. No extension into the floor of the mouth. Limited intracranial: Negative IMPRESSION: Progression of edema in the soft tissues below the chin. Progressive subcutaneous fluid collection. Progressive rim enhancing fluid collection inferior to the left mylohyoid muscle compatible with developing abscess. Surgical consultation is recommended. Electronically Signed   By: CFranchot GalloM.D.   On: 08/12/2017 15:15    Review of Systems  HENT: Positive for sore throat.   Musculoskeletal: Positive for neck pain.  All other systems reviewed and are negative.  Blood pressure (!) 132/91, pulse 73, temperature 98.6 F (37 C), temperature source Oral, resp. rate 16, height 5' 9"  (1.753 m), weight 194 lb (88 kg), last menstrual period 08/03/2017, SpO2 99 %. Physical Exam  Constitutional: She is oriented  to person, place, and time. She appears well-developed and well-nourished. No distress.  HENT:  Head: Normocephalic and atraumatic.  Right Ear: External ear normal.  Left Ear: External ear normal.  Nose: Nose normal.  Mouth/Throat: Oropharynx is clear and moist.  Floor of mouth soft.  Normal tongue.  Exposed bone on lingual  surface of left mandible.  Eyes: Pupils are equal, round, and reactive to light. Conjunctivae and EOM are normal.  Neck:  Prominent, red, tender, fluctuant swelling of submental neck.  Cardiovascular: Normal rate.  Respiratory: Effort normal.  Musculoskeletal: Normal range of motion.  Neurological: She is alert and oriented to person, place, and time. No cranial nerve deficit.  Skin: Skin is warm and dry.  Psychiatric: She has a normal mood and affect. Her behavior is normal. Judgment and thought content normal.    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  On exam, aside from the obvious neck infection, there is an area of exposed mandible on the left.  This, along with her soft tissue infection and mental nerve numbness, likely represents osteomyelitis.  Recommend incision and drainage of the submental space abscesses.  Planned for tomorrow morning.  Risks, benefits, and alternatives were discussed and she expressed understanding and agreement.  A Penrose drain will be left in place for at least a few days.  Please consult Oral Surgery and Infectious Disease regarding further workup and treatment of possible mandible osteomyelitis.  Elene Downum 08/12/2017, 7:04 PM

## 2017-08-12 NOTE — Progress Notes (Signed)
PROGRESS NOTE [facility]   Terri [name]   UGQ:[id number] DOB: 02-01-1976  DOA: 08/10/2017 PCP: [name], MD   Brief Narrative:  Terri [name] is a 42 year old female with medical history significant for hypertension, bipolar disorder and IV drug user presented  to the emergency department complaining of neck pain, swelling and erythema.  Upon ED evaluation CT scan showed neck cellulitis with no abscess.  Patient was admitted for working diagnosis of neck cellulitis and pain management.  Subjective: Patient seen and examined, patient continues to complain about pain, erythema seems to be improving but swelling has increased and more localized.  Lesion is now fluctuant  Assessment & Plan: Cellulitis of the neck Patient was given a dose of clindamycin in the ED, patient currently on Unasyn and vancomycin.  Will continue current antibiotics for now. I have repeat CT maxillofacial which showed developing of submandibular abscess.  ENT has been consulted for possible drainage.  No airway compromise.  Patient able to swallow well.  Pain control as needed.  Patient is an IV drug user with heroin.  In asking for pain medication frequently.  Continue to monitor  Hypertension BP remains stable, continue lisinopril, HCTZ and amlodipine  Hypokalemia Replete Check BMP and mag in the morning  Anxiety/depression Patient on Elavil and Cymbalta will continue   DVT prophylaxis: Lovenox Code Status: Full code Family Communication: None at bedside Disposition Plan: Home when cellulitis improve  Consultants:   None  Procedures:   None  Antimicrobials: Anti-infectives (From admission, onward)   Start     Dose/Rate Route Frequency Ordered Stop   08/12/17 0200  vancomycin (VANCOCIN) IVPB 1000 mg/200 mL premix     1,000 mg 200 mL/hr over 60 Minutes Intravenous Every 12 hours 08/11/17 1124     08/11/17 1130  vancomycin (VANCOCIN) 1,750 mg in sodium chloride 0.9 % 500 mL IVPB     1,750 mg 250 mL/hr over 120 Minutes Intravenous  Once 08/11/17 1045 08/11/17 1600   08/11/17 0600  Ampicillin-Sulbactam (UNASYN) 3 g in sodium chloride 0.9 % 100 mL IVPB     3 g 200 mL/hr over 30 Minutes Intravenous Every 6 hours 08/10/17 2257     08/10/17 2245  Ampicillin-Sulbactam (UNASYN) 3 g in sodium chloride 0.9 % 100 mL IVPB     3 g 200 mL/hr over 30 Minutes Intravenous  Once 08/10/17 2231 08/10/17 2352   08/10/17 1830  clindamycin (CLEOCIN) IVPB 600 mg     600 mg 100 mL/hr over 30 Minutes Intravenous  Once 08/10/17 1818 08/10/17 1929       Objective: Vitals:   08/11/17 1348 08/11/17 2131 08/12/17 0431 08/12/17 1452  BP: (!) 158/98 (!) 143/75 131/78 (!) 132/91  Pulse: 89 75 70 73  Resp: 20 18 18 16   Temp: 98.7 F (37.1 C) 98.9 F (37.2 C) 98.2 F (36.8 C) 98.6 F (37 C)  TempSrc: Oral Oral Oral Oral  SpO2: 98% 97% 96% 99%  Weight:      Height:        Intake/Output Summary (Last 24 hours) at 08/12/2017 1534 Last data filed at 08/12/2017 1500 Gross per 24 hour  Intake 940 ml  Output -  Net 940 ml   Filed Weights   08/10/17 1427  Weight: 88 kg (194 lb)    Examination:  General: Pt is alert, awake, not in acute distress HEENT: Submandibular swelling in the midline, fluctuant.  Erythema has improved.  Tender to palpation Cardiovascular: RRR, S1/S2 +, no  rubs, no gallops Respiratory: CTA bilaterally, no wheezing, no rhonchi Extremities: no edema  Data Reviewed: I have personally reviewed following labs and imaging studies  CBC: Recent Labs  Lab 08/10/17 1848 08/11/17 0600 08/12/17 0607  WBC 26.5* 19.1* 16.9*  NEUTROABS 22.8*  --  12.8*  HGB 9.1* 8.3* 8.4*  HCT 28.2* 25.8* 26.5*  MCV 63.2* 63.5* 63.9*  PLT 313 293 244   Basic Metabolic Panel: Recent Labs  Lab 08/10/17 1848 08/12/17 0607  NA 140 140  K 2.9* 3.0*  CL 101 103  CO2 29 28  GLUCOSE 105* 78  BUN 9 7  CREATININE 0.64 0.47  CALCIUM 9.0 8.8*  MG  --  1.9   GFR: Estimated  Creatinine Clearance: 107.2 mL/min (by C-G formula based on SCr of 0.47 mg/dL). Liver Function Tests: Recent Labs  Lab 08/10/17 1848  AST 15  ALT 9*  ALKPHOS 120  BILITOT 0.6  PROT 6.3*  ALBUMIN 3.0*   No results for input(s): LIPASE, AMYLASE in the last 168 hours. No results for input(s): AMMONIA in the last 168 hours. Coagulation Profile: No results for input(s): INR, PROTIME in the last 168 hours. Cardiac Enzymes: No results for input(s): CKTOTAL, CKMB, CKMBINDEX, TROPONINI in the last 168 hours. BNP (last 3 results) No results for input(s): PROBNP in the last 8760 hours. HbA1C: No results for input(s): HGBA1C in the last 72 hours. CBG: No results for input(s): GLUCAP in the last 168 hours. Lipid Profile: No results for input(s): CHOL, HDL, LDLCALC, TRIG, CHOLHDL, LDLDIRECT in the last 72 hours. Thyroid Function Tests: No results for input(s): TSH, T4TOTAL, FREET4, T3FREE, THYROIDAB in the last 72 hours. Anemia Panel: No results for input(s): VITAMINB12, FOLATE, FERRITIN, TIBC, IRON, RETICCTPCT in the last 72 hours. Sepsis Labs: Recent Labs  Lab 08/10/17 1901  LATICACIDVEN 1.07    Recent Results (from the past 240 hour(s))  Blood Culture (routine x 2)     Status: None (Preliminary result)   Collection Time: 08/10/17  6:33 PM  Result Value Ref Range Status   Specimen Description   Final    BLOOD RIGHT ANTECUBITAL Performed at [facility], [place] [address] [place], [place] [address]    Special Requests   Final    BOTTLES DRAWN AEROBIC AND ANAEROBIC Blood Culture adequate volume Performed at [facility] [address], [place], [place] [address]    Culture   Final    NO GROWTH 2 DAYS Performed at [facility] [address], [place], [place] [address]    Report Status PENDING  Incomplete  Blood Culture (routine x 2)     Status: None (Preliminary result)   Collection Time: 08/11/17  6:00 AM  Result Value Ref Range  Status   Specimen Description   Final    BLOOD LEFT HAND Performed at [facility] [address], [place], [place] [address]    Special Requests   Final    BOTTLES DRAWN AEROBIC AND ANAEROBIC Blood Culture adequate volume Performed at [facility] [address], [place], [place] [address]    Culture   Final    NO GROWTH 1 DAY Performed at [facility], [place] [address] [place],  [address]    Report Status PENDING  Incomplete      Radiology Studies: Ct Soft Tissue Neck W Contrast  Result Date: 08/10/2017 CLINICAL DATA:  Neck swelling and nausea.  Dental work 3 weeks ago. EXAM:  CT NECK WITH CONTRAST TECHNIQUE: Multidetector CT imaging of the neck was performed using the standard protocol following the bolus administration of intravenous contrast. CONTRAST:  71mL ISOVUE-300 IOPAMIDOL (ISOVUE-300) INJECTION 61% COMPARISON:  CT neck 05/05/2014 FINDINGS: Pharynx and larynx: --Nasopharynx: Fossae of Rosenmuller are clear. Normal adenoid tonsils for age. --Oropharynx: Normal --Hypopharynx: Normal vallecula and pyriform sinuses. --Larynx: Normal epiglottis and pre-epiglottic space. Normal aryepiglottic and vocal folds. --Retropharyngeal space: No abscess, effusion or lymphadenopathy. Salivary glands: --Parotid: No mass lesion or inflammation. No sialolithiasis or ductal dilatation. --Submandibular: Symmetric without inflammation. No sialolithiasis or ductal dilatation. --Sublingual: Normal. No ranula or other visible lesion of the base of tongue and floor of mouth. Thyroid: Normal. Lymph nodes: Reactive bilateral level 1A nodes measure 6 mm. Left level II a nodes measure up to 8 mm. Right level IIa nodes also measure up to 8 mm. No lower cervical adenopathy. No abnormal density nodes. Vascular: Major cervical vessels are patent. Limited intracranial: Normal. Visualized orbits: Normal. Mastoids and visualized paranasal sinuses: No fluid levels or  advanced mucosal thickening. No mastoid effusion. Skeleton: No bony spinal canal stenosis. No lytic or blastic lesions. Upper chest: Clear. Other: There is inflammatory change within the left sublingual and submandibular space with inflammatory induration of the overlying subcutaneous fat and thickening of the platysma muscle. A definite source is not clear, but the most likely candidates are the empty sockets of the posterior left mandible. There is no abscess or drainable fluid collection. IMPRESSION: 1. Inflammation throughout the left sublingual and submandibular space with cellulitis of the overlying subcutaneous tissues, likely odontogenic in origin. While no definitive source is identified, the most likely candidates are the empty sockets within the posterior left mandible. 2. Reactive cervical lymphadenopathy. 3. No abscess or drainable fluid collection. Electronically Signed   By: Ulyses Jarred M.D.   On: 08/10/2017 21:42   Ct Maxillofacial W Contrast  Result Date: 08/12/2017 CLINICAL DATA:  Cellulitis neck. IV drug abuse. Dental work several weeks ago. EXAM: CT MAXILLOFACIAL WITH CONTRAST TECHNIQUE: Multidetector CT imaging of the maxillofacial structures was performed with intravenous contrast. Multiplanar CT image reconstructions were also generated. CONTRAST:  9mL ISOVUE-300 IOPAMIDOL (ISOVUE-300) INJECTION 61% COMPARISON:  CT neck 08/10/2017 FINDINGS: Osseous: Multiple dental extractions, which appear recent. No evidence of osteomyelitis in the mandible or maxilla. Visualized cervical spine negative. Orbits: Normal soft tissues of the orbit. Sinuses: Paranasal sinuses clear.  Mastoid sinus clear Soft tissues: Progressive soft tissue swelling involving the chin. There is progressive subcutaneous fluid some which appears to be organized and could represent developing subcutaneous abscess. This component measures approximately 2.4 cm in diameter. To the left of midline, there is a rim enhancing fluid  collection measuring 24 x 11 mm just below the left mylohyoid muscle and perhaps extending into the muscle. This also is suspicious for abscess. Small gas bubbles are present in the soft tissues. There is diffuse edema and skin thickening involving the submandibular soft tissues. No extension into the floor of the mouth. Limited intracranial: Negative IMPRESSION: Progression of edema in the soft tissues below the chin. Progressive subcutaneous fluid collection. Progressive rim enhancing fluid collection inferior to the left mylohyoid muscle compatible with developing abscess. Surgical consultation is recommended. Electronically Signed   By: Franchot Gallo M.D.   On: 08/12/2017 15:15      Scheduled Meds: . amitriptyline  75 mg Oral QHS  . amLODipine  10 mg Oral Daily  . docusate sodium  100 mg Oral BID  . DULoxetine  60 mg Oral Daily  . enoxaparin (LOVENOX) injection  40 mg Subcutaneous Daily  . lisinopril  10 mg Oral Daily   And  . hydrochlorothiazide  12.5 mg Oral Daily  . iopamidol      . nicotine  21 mg Transdermal Daily  . pantoprazole  40 mg Oral BID  . polyethylene glycol  17 g Oral Daily  . predniSONE  20 mg Oral Q breakfast   Continuous Infusions: . ampicillin-sulbactam (UNASYN) IV Stopped (08/12/17 0946)  . vancomycin Stopped (08/12/17 1425)     LOS: 0 days    Time spent: Total of 25 minutes spent with pt, greater than 50% of which was spent in discussion of  treatment, counseling and coordination of care   Chipper Oman, MD Pager: Text Page via [email or web address]   If 7PM-7AM, please contact night-coverage [email or web address] 08/12/2017, 3:34 PM   Note - This record has been created using [employer]. Chart creation errors have been sought, but may not always have been located. Such creation errors do not reflect on the standard of medical care.

## 2017-08-12 NOTE — Progress Notes (Signed)
LCSW consulted for bus pass.   Patient requesting bus pass for boyfriend who is not a patient or visiting the hospital.   LCSW explained that bus passes were for patients only who have no other means of transportation.  LCSW notified patient that we will be more than happy to provide her with a bus pass at dc if needed.   Patient expressed that she understood.   LCSW signing off.

## 2017-08-13 ENCOUNTER — Encounter (HOSPITAL_COMMUNITY)
Admission: EM | Disposition: A | Discharge status: Home / Self Care | Payer: Self-pay | Source: Home / Self Care | Attending: Family Medicine

## 2017-08-13 ENCOUNTER — Inpatient Hospital Stay (HOSPITAL_COMMUNITY): Payer: Medicare Other | Admitting: Certified Registered"

## 2017-08-13 ENCOUNTER — Encounter (HOSPITAL_COMMUNITY): Payer: Self-pay | Admitting: Anesthesiology

## 2017-08-13 HISTORY — PX: INCISION AND DRAINAGE OF PERITONSILLAR ABCESS: SHX6257

## 2017-08-13 LAB — CBC WITH DIFFERENTIAL/PLATELET
BASOS PCT: 0 %
Basophils Absolute: 0 10*3/uL (ref 0.0–0.1)
Eosinophils Absolute: 0.1 10*3/uL (ref 0.0–0.7)
Eosinophils Relative: 1 %
HCT: 26.2 % — ABNORMAL LOW (ref 36.0–46.0)
Hemoglobin: 8.3 g/dL — ABNORMAL LOW (ref 12.0–15.0)
LYMPHS ABS: 4.4 10*3/uL — AB (ref 0.7–4.0)
Lymphocytes Relative: 34 %
MCH: 20.3 pg — ABNORMAL LOW (ref 26.0–34.0)
MCHC: 31.7 g/dL (ref 30.0–36.0)
MCV: 64.1 fL — AB (ref 78.0–100.0)
MONO ABS: 0.6 10*3/uL (ref 0.1–1.0)
Monocytes Relative: 5 %
NEUTROS ABS: 7.7 10*3/uL (ref 1.7–7.7)
Neutrophils Relative %: 60 %
Platelets: 364 10*3/uL (ref 150–400)
RBC: 4.09 MIL/uL (ref 3.87–5.11)
RDW: 20 % — AB (ref 11.5–15.5)
WBC: 12.8 10*3/uL — ABNORMAL HIGH (ref 4.0–10.5)

## 2017-08-13 LAB — BASIC METABOLIC PANEL
Anion gap: 9 (ref 5–15)
BUN: 10 mg/dL (ref 6–20)
CHLORIDE: 102 mmol/L (ref 101–111)
CO2: 29 mmol/L (ref 22–32)
Calcium: 8.8 mg/dL — ABNORMAL LOW (ref 8.9–10.3)
Creatinine, Ser: 0.54 mg/dL (ref 0.44–1.00)
GFR calc Af Amer: >60 mL/min (ref >60)
GFR calc non Af Amer: >60 mL/min (ref >60)
Glucose, Bld: 77 mg/dL (ref 65–99)
Potassium: 3.2 mmol/L — ABNORMAL LOW (ref 3.5–5.1)
Sodium: 140 mmol/L (ref 135–145)

## 2017-08-13 LAB — MAGNESIUM: Magnesium: 1.8 mg/dL (ref 1.7–2.4)

## 2017-08-13 SURGERY — INCISION AND DRAINAGE, ABSCESS, PERITONSILLAR
Anesthesia: General | Site: Neck | Laterality: Left

## 2017-08-13 MED ORDER — 0.9 % SODIUM CHLORIDE (POUR BTL) OPTIME
TOPICAL | Status: DC | PRN
  Administered 2017-08-13: 1000 mL

## 2017-08-13 MED ORDER — EPHEDRINE 5 MG/ML INJ
INTRAVENOUS | Status: AC
  Filled 2017-08-13: qty 10

## 2017-08-13 MED ORDER — OXYCODONE HCL ER 10 MG PO T12A
10.0000 mg | EXTENDED_RELEASE_TABLET | Freq: Two times a day (BID) | ORAL | Status: DC
  Administered 2017-08-13 – 2017-08-16 (×6): 10 mg via ORAL
  Filled 2017-08-13 (×6): qty 1

## 2017-08-13 MED ORDER — MIDAZOLAM HCL 2 MG/2ML IJ SOLN
INTRAMUSCULAR | Status: AC
  Filled 2017-08-13: qty 2

## 2017-08-13 MED ORDER — HYDROMORPHONE HCL 1 MG/ML IJ SOLN
INTRAMUSCULAR | Status: AC
  Filled 2017-08-13: qty 1

## 2017-08-13 MED ORDER — MEPERIDINE HCL 50 MG/ML IJ SOLN: 6.2500 mg | INTRAMUSCULAR | Status: DC | PRN

## 2017-08-13 MED ORDER — LABETALOL HCL 5 MG/ML IV SOLN: 10.0000 mg | INTRAVENOUS | Status: DC | PRN

## 2017-08-13 MED ORDER — PROPOFOL 10 MG/ML IV BOLUS
INTRAVENOUS | Status: DC | PRN
  Administered 2017-08-13: 200 mg via INTRAVENOUS

## 2017-08-13 MED ORDER — FENTANYL CITRATE (PF) 250 MCG/5ML IJ SOLN
INTRAMUSCULAR | Status: AC
Start: 2017-08-13 — End: 2017-08-13
  Filled 2017-08-13: qty 5

## 2017-08-13 MED ORDER — ONDANSETRON HCL 4 MG/2ML IJ SOLN
INTRAMUSCULAR | Status: DC | PRN
  Administered 2017-08-13: 4 mg via INTRAVENOUS

## 2017-08-13 MED ORDER — KETAMINE HCL 10 MG/ML IJ SOLN
INTRAMUSCULAR | Status: DC | PRN
  Administered 2017-08-13: 5 mg via INTRAVENOUS
  Administered 2017-08-13: 10 mg via INTRAVENOUS

## 2017-08-13 MED ORDER — DEXAMETHASONE SODIUM PHOSPHATE 10 MG/ML IJ SOLN
INTRAMUSCULAR | Status: DC | PRN
  Administered 2017-08-13: 10 mg via INTRAVENOUS

## 2017-08-13 MED ORDER — HYDROMORPHONE HCL 1 MG/ML IJ SOLN
1.0000 mg | INTRAMUSCULAR | Status: DC | PRN
  Administered 2017-08-13 – 2017-08-16 (×22): 1 mg via INTRAVENOUS
  Filled 2017-08-13 (×22): qty 1

## 2017-08-13 MED ORDER — ACETAMINOPHEN 500 MG PO TABS
1000.0000 mg | ORAL_TABLET | Freq: Four times a day (QID) | ORAL | Status: DC
  Filled 2017-08-13: qty 2

## 2017-08-13 MED ORDER — LIDOCAINE-EPINEPHRINE 1 %-1:100000 IJ SOLN
INTRAMUSCULAR | Status: DC | PRN
  Administered 2017-08-13: 3 mL

## 2017-08-13 MED ORDER — MIDAZOLAM HCL 2 MG/2ML IJ SOLN
1.0000 mg | Freq: Once | INTRAMUSCULAR | Status: AC
  Administered 2017-08-13: 1 mg via INTRAVENOUS

## 2017-08-13 MED ORDER — PROMETHAZINE HCL 25 MG/ML IJ SOLN
INTRAMUSCULAR | Status: AC
  Administered 2017-08-13: 6.25 mg
  Filled 2017-08-13: qty 1

## 2017-08-13 MED ORDER — KETOROLAC TROMETHAMINE 15 MG/ML IJ SOLN
INTRAMUSCULAR | Status: AC
  Filled 2017-08-13: qty 1

## 2017-08-13 MED ORDER — PHENYLEPHRINE HCL 10 MG/ML IJ SOLN
INTRAMUSCULAR | Status: AC
  Filled 2017-08-13: qty 1

## 2017-08-13 MED ORDER — LIDOCAINE 2% (20 MG/ML) 5 ML SYRINGE
INTRAMUSCULAR | Status: AC
  Filled 2017-08-13: qty 10

## 2017-08-13 MED ORDER — ONDANSETRON HCL 4 MG/2ML IJ SOLN
INTRAMUSCULAR | Status: AC
  Filled 2017-08-13: qty 4

## 2017-08-13 MED ORDER — HYDROMORPHONE HCL 1 MG/ML IJ SOLN
INTRAMUSCULAR | Status: AC
  Administered 2017-08-13: 0.5 mg
  Filled 2017-08-13: qty 1

## 2017-08-13 MED ORDER — HYDROMORPHONE HCL 1 MG/ML IJ SOLN
0.5000 mg | Freq: Once | INTRAMUSCULAR | Status: AC
  Administered 2017-08-13: 0.5 mg via INTRAVENOUS

## 2017-08-13 MED ORDER — LIP MEDEX EX OINT: TOPICAL_OINTMENT | CUTANEOUS | Status: DC | PRN

## 2017-08-13 MED ORDER — LACTATED RINGERS IV SOLN
INTRAVENOUS | Status: DC | PRN
  Administered 2017-08-13: 09:00:00 via INTRAVENOUS

## 2017-08-13 MED ORDER — ACETAMINOPHEN 10 MG/ML IV SOLN: 1000.0000 mg | Freq: Once | INTRAVENOUS | Status: DC | PRN

## 2017-08-13 MED ORDER — PHENYLEPHRINE 40 MCG/ML (10ML) SYRINGE FOR IV PUSH (FOR BLOOD PRESSURE SUPPORT)
PREFILLED_SYRINGE | INTRAVENOUS | Status: AC
  Filled 2017-08-13: qty 20

## 2017-08-13 MED ORDER — HYDROMORPHONE HCL 1 MG/ML IJ SOLN
0.2500 mg | INTRAMUSCULAR | Status: DC | PRN
  Administered 2017-08-13: 0.5 mg via INTRAVENOUS

## 2017-08-13 MED ORDER — GABAPENTIN 400 MG PO CAPS
400.0000 mg | ORAL_CAPSULE | Freq: Once | ORAL | Status: DC
  Filled 2017-08-13: qty 1

## 2017-08-13 MED ORDER — HYDRALAZINE HCL 20 MG/ML IJ SOLN
5.0000 mg | INTRAMUSCULAR | Status: DC | PRN
  Administered 2017-08-13 (×2): 5 mg via INTRAVENOUS

## 2017-08-13 MED ORDER — PROPOFOL 10 MG/ML IV BOLUS
INTRAVENOUS | Status: AC
  Filled 2017-08-13: qty 20

## 2017-08-13 MED ORDER — KETAMINE HCL 10 MG/ML IJ SOLN
INTRAMUSCULAR | Status: AC
  Filled 2017-08-13: qty 1

## 2017-08-13 MED ORDER — LIDOCAINE 2% (20 MG/ML) 5 ML SYRINGE
INTRAMUSCULAR | Status: DC | PRN
  Administered 2017-08-13: 100 mg via INTRAVENOUS

## 2017-08-13 MED ORDER — DEXAMETHASONE SODIUM PHOSPHATE 10 MG/ML IJ SOLN
INTRAMUSCULAR | Status: AC
  Filled 2017-08-13: qty 2

## 2017-08-13 MED ORDER — PROMETHAZINE HCL 25 MG/ML IJ SOLN
6.2500 mg | INTRAMUSCULAR | Status: DC | PRN
  Administered 2017-08-13: 6.25 mg via INTRAVENOUS

## 2017-08-13 MED ORDER — KETOROLAC TROMETHAMINE 15 MG/ML IJ SOLN: 15.0000 mg | Freq: Once | INTRAMUSCULAR | Status: DC

## 2017-08-13 MED ORDER — PROMETHAZINE HCL 25 MG/ML IJ SOLN: 6.2500 mg | INTRAMUSCULAR | Status: DC | PRN

## 2017-08-13 MED ORDER — ACETAMINOPHEN 10 MG/ML IV SOLN
INTRAVENOUS | Status: AC
  Filled 2017-08-13: qty 100

## 2017-08-13 MED ORDER — POTASSIUM CHLORIDE CRYS ER 20 MEQ PO TBCR
40.0000 meq | EXTENDED_RELEASE_TABLET | ORAL | Status: AC
  Administered 2017-08-13: 40 meq via ORAL
  Filled 2017-08-13: qty 2

## 2017-08-13 MED ORDER — LIDOCAINE-EPINEPHRINE 1 %-1:100000 IJ SOLN
INTRAMUSCULAR | Status: AC
  Filled 2017-08-13: qty 1

## 2017-08-13 MED ORDER — HYDRALAZINE HCL 20 MG/ML IJ SOLN
INTRAMUSCULAR | Status: AC
  Filled 2017-08-13: qty 1

## 2017-08-13 MED ORDER — FENTANYL CITRATE (PF) 250 MCG/5ML IJ SOLN
INTRAMUSCULAR | Status: DC | PRN
  Administered 2017-08-13: 50 ug via INTRAVENOUS
  Administered 2017-08-13 (×2): 100 ug via INTRAVENOUS

## 2017-08-13 MED ORDER — MIDAZOLAM HCL 2 MG/2ML IJ SOLN
INTRAMUSCULAR | Status: DC | PRN
  Administered 2017-08-13: 2 mg via INTRAVENOUS

## 2017-08-13 SURGICAL SUPPLY — 38 items
ATTRACTOMAT 16X20 MAGNETIC DRP (DRAPES) ×2 IMPLANT
BAG SPEC THK2 15X12 ZIP CLS (MISCELLANEOUS) ×1
BAG ZIPLOCK 12X15 (MISCELLANEOUS) ×3 IMPLANT
BNDG GAUZE ELAST 4 BULKY (GAUZE/BANDAGES/DRESSINGS) ×2 IMPLANT
CONT SPEC 4OZ CLIKSEAL STRL BL (MISCELLANEOUS) ×3 IMPLANT
DRAIN PENROSE 18X1/4 LTX STRL (WOUND CARE) ×2 IMPLANT
DRSG EMULSION OIL 3X3 NADH (GAUZE/BANDAGES/DRESSINGS) IMPLANT
ELECT COATED BLADE 2.86 ST (ELECTRODE) ×3 IMPLANT
ELECT NDL TIP 2.8 STRL (NEEDLE) ×1 IMPLANT
ELECT NEEDLE TIP 2.8 STRL (NEEDLE) ×3 IMPLANT
ELECT PENCIL ROCKER SW 15FT (MISCELLANEOUS) ×2 IMPLANT
ELECT REM PT RETURN 15FT ADLT (MISCELLANEOUS) ×3 IMPLANT
GAUZE SPONGE 4X4 12PLY STRL (GAUZE/BANDAGES/DRESSINGS) ×2 IMPLANT
GAUZE SPONGE 4X4 16PLY XRAY LF (GAUZE/BANDAGES/DRESSINGS) ×2 IMPLANT
GLOVE BIO SURGEON STRL SZ7.5 (GLOVE) ×3 IMPLANT
KIT BASIN OR (CUSTOM PROCEDURE TRAY) ×3 IMPLANT
MARKER SKIN DUAL TIP RULER LAB (MISCELLANEOUS) ×3 IMPLANT
NDL HYPO 25X1 1.5 SAFETY (NEEDLE) IMPLANT
NEEDLE HYPO 25X1 1.5 SAFETY (NEEDLE) ×3 IMPLANT
NS IRRIG 1000ML POUR BTL (IV SOLUTION) ×3 IMPLANT
PACK EENT SPLIT (PACKS) ×3 IMPLANT
POSITIONER SURGICAL ARM (MISCELLANEOUS) ×3 IMPLANT
SOL PREP POV-IOD 4OZ 10% (MISCELLANEOUS) ×3 IMPLANT
SUT CHROMIC 4 0 P 3 18 (SUTURE) IMPLANT
SUT ETHILON 2 0 PS N (SUTURE) ×2 IMPLANT
SUT ETHILON 4 0 PS 2 18 (SUTURE) IMPLANT
SUT ETHILON 5 0 P 3 18 (SUTURE)
SUT NYLON ETHILON 5-0 P-3 1X18 (SUTURE) IMPLANT
SUT SILK 3 0 (SUTURE)
SUT SILK 3-0 KS 30XBRD (SUTURE) IMPLANT
SWAB COLLECTION DEVICE MRSA (MISCELLANEOUS) ×3 IMPLANT
SWAB CULTURE ESWAB REG 1ML (MISCELLANEOUS) ×3 IMPLANT
SYR BULB IRRIGATION 50ML (SYRINGE) ×2 IMPLANT
SYR CONTROL 10ML LL (SYRINGE) ×2 IMPLANT
TAPE SURG TRANSPORE 1 IN (GAUZE/BANDAGES/DRESSINGS) IMPLANT
TAPE SURGICAL TRANSPORE 1 IN (GAUZE/BANDAGES/DRESSINGS) ×2
WATER STERILE IRR 1000ML POUR (IV SOLUTION) ×3 IMPLANT
YANKAUER SUCT BULB TIP 10FT TU (MISCELLANEOUS) ×2 IMPLANT

## 2017-08-13 NOTE — Transfer of Care (Signed)
Immediate Anesthesia Transfer of Care Note  Patient: Terri [name]  Procedure(s) Performed: INCISION AND DRAINAGE OF LEFT NECK ABSCESS (Left Neck)  Patient Location: PACU  Anesthesia Type:General  Level of Consciousness: awake and alert   Airway & Oxygen Therapy: Patient Spontanous Breathing and Patient connected to face mask oxygen  Post-op Assessment: Report given to RN and Post -op Vital signs reviewed and stable  Post vital signs: Reviewed and stable  Last Vitals:  Vitals Value Taken Time  BP 168/101 08/13/2017  9:46 AM  Temp    Pulse 81 08/13/2017  9:53 AM  Resp 16 08/13/2017  9:53 AM  SpO2 100 % 08/13/2017  9:53 AM  Vitals shown include unvalidated device data.  Last Pain:  Vitals:   08/13/17 0740  TempSrc:   PainSc: 10-Worst pain ever      Patients Stated Pain Goal: 7 (03/49/17 9150)  Complications: No apparent anesthesia complications

## 2017-08-13 NOTE — Anesthesia Postprocedure Evaluation (Signed)
Anesthesia Post Note  Patient: Terri [name]  Procedure(s) Performed: INCISION AND DRAINAGE OF LEFT NECK ABSCESS (Left Neck)     Patient location during evaluation: PACU Anesthesia Type: General Level of consciousness: awake and sedated Pain management: pain level not controlled Vital Signs Assessment: post-procedure vital signs reviewed and stable Respiratory status: spontaneous breathing Cardiovascular status: stable Postop Assessment: no apparent nausea or vomiting Anesthetic complications: no    Last Vitals:  Vitals:   08/13/17 1022 08/13/17 1030  BP: (!) 187/100 (!) 185/109  Pulse: 74 73  Resp: 14 15  Temp:    SpO2: 100% 100%    Last Pain:  Vitals:   08/13/17 1030  TempSrc:   PainSc: 10-Worst pain ever   Pain Goal: Patients Stated Pain Goal: 7 (08/13/17 1030)               Trueman Worlds JR,JOHN Eun Vermeer

## 2017-08-13 NOTE — Anesthesia Procedure Notes (Signed)
Date/Time: 08/13/2017 9:38 AM Performed by: Cynda Familia, CRNA Oxygen Delivery Method: Simple face mask Placement Confirmation: positive ETCO2 and breath sounds checked- equal and bilateral Dental Injury: Teeth and Oropharynx as per pre-operative assessment

## 2017-08-13 NOTE — Anesthesia Procedure Notes (Signed)
Procedure Name: LMA Insertion Date/Time: 08/13/2017 9:10 AM Performed by: Cynda Familia, CRNA Pre-anesthesia Checklist: Patient identified, Emergency Drugs available, Suction available and Patient being monitored Patient Re-evaluated:Patient Re-evaluated prior to induction Oxygen Delivery Method: Circle System Utilized Preoxygenation: Pre-oxygenation with 100% oxygen Induction Type: IV induction Ventilation: Mask ventilation without difficulty LMA: LMA inserted LMA Size: 4.0 Number of attempts: 1 Placement Confirmation: positive ETCO2 Tube secured with: Tape Dental Injury: Teeth and Oropharynx as per pre-operative assessment  Comments: Smooth IV induction Hatchette-- LMA AM CRNA atraumatic--- teeth and mouth as preop- very poor dentition-- many missing , chipped teeth--unchanged after LMA placement-- bilat BS Hatchette

## 2017-08-13 NOTE — Consult Note (Signed)
[facility]  Total days of antibiotics 4        Day 3 vanco        Day 4 amp/sub               Reason for Consult: submental abscess with mandibular osteomyelitis    Referring Physician: silva zapata  Principal Problem:   Cellulitis of submandibular region Active Problems:   Opioid abuse with opioid-induced mood disorder ([place])   Odontogenic infection of jaw   Cellulitis and abscess of neck    HPI: Terri [name] is a 42 y.o. female  with a history of bipolar disease, anxiety, depression and hx of substance abuse admitted on 3/20 with neck pain, swelling under her chin with associated erythema.  Located in lower jaw going down to chest x 1 day. She reported history of having dental fillings 3-4 wk prior to admit. She was started empirically on clindamycin plus amp/sub for odontogenic infection. Imaging showed Inflammation throughout the left sublingual and submandibular space with cellulitis of the overlying subcutaneous tissues, likely odontogenic in origin. While no definitive source is identified, the most likely candidates are the empty sockets within the posterior left mandible. Due to having slow response to abtx and worsening inflammation about her neck, she underwent repeat CT on 3/22 that now showed organized abscess. She was evaluated by ENT who took her to the OR on 3/23 to evacuate abscess and also found that she had exposed mandible, and intra-oral pus dry socket pocket at left lower side that was irrigated. She has penrose drain in place for the next 24-48hrs. The patient remains on vancomycin plus amp/sub. Her blood cx are NGTD. Or cultures are pending. Having significant pain since surgery   Sochx: no longer doing illicit drugs since last Fall, detox. Lives with daughter and fiance. No working, on disability  Past Medical History:  Diagnosis Date  . Bipolar disorder ([place])   . Chronic back pain   . Hypertension   . Migraine headache   .  Sciatica   . Stress incontinence     Allergies:  Allergies  Allergen Reactions  . Ketorolac Tromethamine Itching and Nausea And Vomiting  . Methocarbamol Diarrhea    MEDICATIONS: . [MAR Hold] amitriptyline  75 mg Oral QHS  . [MAR Hold] amLODipine  10 mg Oral Daily  . [MAR Hold] docusate sodium  100 mg Oral BID  . [MAR Hold] DULoxetine  60 mg Oral Daily  . [MAR Hold] enoxaparin (LOVENOX) injection  40 mg Subcutaneous Daily  . gabapentin  400 mg Oral Once  . hydrALAZINE      . [MAR Hold] lisinopril  10 mg Oral Daily   And  . [MAR Hold] hydrochlorothiazide  12.5 mg Oral Daily  . HYDROmorphone      . HYDROmorphone      . ketorolac  15 mg Intravenous Once  . ketorolac      . midazolam      . [MAR Hold] nicotine  21 mg Transdermal Daily  . [MAR Hold] pantoprazole  40 mg Oral BID  . [MAR Hold] polyethylene glycol  17 g Oral Daily  . [MAR Hold] potassium chloride  40 mEq Oral Q4H  . [MAR Hold] predniSONE  20 mg Oral Q breakfast    Social History   Tobacco Use  . Smoking status: Current Every Day Smoker    Packs/day: 1.00    Types: Cigarettes  . Smokeless tobacco: Never Used  Substance  Use Topics  . Alcohol use: No  . Drug use: Yes    Types: IV    Comment: Heroin    Family History  Problem Relation Age of Onset  . Diabetes Mother   . Hypertension Mother   . Diabetes Father   . Hypertension Father     Review of Systems  Constitutional: Negative for fever, chills, diaphoresis, activity change, appetite change, fatigue and unexpected weight change.  HENT: Negative for congestion, sore throat, rhinorrhea, sneezing, trouble swallowing and sinus pressure.  Jaw pain radiating to left ear Eyes: Negative for photophobia and visual disturbance.  Respiratory: Negative for cough, chest tightness, shortness of breath, wheezing and stridor.  Cardiovascular: Negative for chest pain, palpitations and leg swelling.  Gastrointestinal: Negative for nausea, vomiting, abdominal pain,  diarrhea, constipation, blood in stool, abdominal distention and anal bleeding.  Genitourinary: Negative for dysuria, hematuria, flank pain and difficulty urinating.  Musculoskeletal: Negative for myalgias, back pain, joint swelling, arthralgias and gait problem.  Skin: Negative for color change, pallor, rash and wound.  Neurological: Negative for dizziness, tremors, weakness and light-headedness.  Hematological: Negative for adenopathy. Does not bruise/bleed easily.  Psychiatric/Behavioral: Negative for behavioral problems, confusion, sleep disturbance, dysphoric mood, decreased concentration and agitation.     OBJECTIVE: Temp:  [97.7 F (36.5 C)-98.8 F (37.1 C)] 98.1 F (36.7 C) (03/23 1145) Pulse Rate:  [63-90] 69 (03/23 1145) Resp:  [12-21] 14 (03/23 1145) BP: (132-187)/(91-120) 159/95 (03/23 1145) SpO2:  [94 %-100 %] 94 % (03/23 1145) Physical Exam  Constitutional:  oriented to person, place, and time. appears well-developed and well-nourished. In mild distress, tearful, appears older than stated age HENT: [place]/AT, PERRLA, no scleral icterus,  Mouth/Throat: Oropharynx is clear and moist. No oropharyngeal exudate. poor dentition, limited opening of mouth due to pain Cardiovascular: Normal rate, regular rhythm and normal heart sounds. Exam reveals no gallop and no friction rub.  No murmur heard.  Pulmonary/Chest: Effort normal and breath sounds normal. No respiratory distress.  has no wheezes.  Neck = supple, no nuchal rigidity, dressing intact with serosanginous drainage Abdominal: Soft. Bowel sounds are normal.  exhibits no distension. There is no tenderness.  Lymphadenopathy: no cervical adenopathy. No axillary adenopathy Neurological: alert and oriented to person, place, and time.  Skin: Skin is warm and dry. No rash noted. No erythema.  Psychiatric: a normal mood and affect.  behavior is normal.    LABS: Results for orders placed or performed during the hospital encounter of  08/10/17 (from the past 48 hour(s))  Basic metabolic panel     Status: Abnormal   Collection Time: 08/12/17  6:07 AM  Result Value Ref Range   Sodium 140 135 - 145 mmol/L   Potassium 3.0 (L) 3.5 - 5.1 mmol/L   Chloride 103 101 - 111 mmol/L   CO2 28 22 - 32 mmol/L   Glucose, Bld 78 65 - 99 mg/dL   BUN 7 6 - 20 mg/dL   Creatinine, Ser 0.47 0.44 - 1.00 mg/dL   Calcium 8.8 (L) 8.9 - 10.3 mg/dL   GFR calc non Af Amer >60 >60 mL/min   GFR calc Af Amer >60 >60 mL/min    Comment: (NOTE) The eGFR has been calculated using the CKD EPI equation. This calculation has not been validated in all clinical situations. eGFR's persistently <60 mL/min signify possible Chronic Kidney Disease.    Anion gap 9 5 - 15    Comment: Performed at [facility], [address] [place],  [place] [address]  CBC with Differential/Platelet     Status: Abnormal   Collection Time: 08/12/17  6:07 AM  Result Value Ref Range   WBC 16.9 (H) 4.0 - 10.5 K/uL   RBC 4.15 3.87 - 5.11 MIL/uL   Hemoglobin 8.4 (L) 12.0 - 15.0 g/dL   HCT 26.5 (L) 36.0 - 46.0 %   MCV 63.9 (L) 78.0 - 100.0 fL   MCH 20.2 (L) 26.0 - 34.0 pg   MCHC 31.7 30.0 - 36.0 g/dL   RDW 20.2 (H) 11.5 - 15.5 %   Platelets 342 150 - 400 K/uL   Neutrophils Relative % 76 %   Lymphocytes Relative 19 %   Monocytes Relative 4 %   Eosinophils Relative 1 %   Basophils Relative 0 %   Neutro Abs 12.8 (H) 1.7 - 7.7 K/uL   Lymphs Abs 3.2 0.7 - 4.0 K/uL   Monocytes Absolute 0.7 0.1 - 1.0 K/uL   Eosinophils Absolute 0.2 0.0 - 0.7 K/uL   Basophils Absolute 0.0 0.0 - 0.1 K/uL   RBC Morphology TARGET CELLS     Comment: Performed at [facility], [place] [address], [place], [place] [address]  Magnesium     Status: None   Collection Time: 08/12/17  6:07 AM  Result Value Ref Range   Magnesium 1.9 1.7 - 2.4 mg/dL    Comment: Performed at [facility], [place] [address] [place], [place] [address]  Surgical PCR screen      Status: None   Collection Time: 08/12/17  8:21 PM  Result Value Ref Range   MRSA, PCR NEGATIVE NEGATIVE   Staphylococcus aureus NEGATIVE NEGATIVE    Comment: (NOTE) The Xpert SA Assay (FDA approved for NASAL specimens in patients 40 years of age and older), is one component of a comprehensive surveillance program. It is not intended to diagnose infection nor to guide or monitor treatment. Performed at [facility], [place] [address] [place], [place] [address]   Magnesium     Status: None   Collection Time: 08/13/17  5:20 AM  Result Value Ref Range   Magnesium 1.8 1.7 - 2.4 mg/dL    Comment: Performed at [facility], [place] [name] [place], [place] [address]  Basic metabolic panel     Status: Abnormal   Collection Time: 08/13/17  5:20 AM  Result Value Ref Range   Sodium 140 135 - 145 mmol/L   Potassium 3.2 (L) 3.5 - 5.1 mmol/L   Chloride 102 101 - 111 mmol/L   CO2 29 22 - 32 mmol/L   Glucose, Bld 77 65 - 99 mg/dL   BUN 10 6 - 20 mg/dL   Creatinine, Ser 0.54 0.44 - 1.00 mg/dL   Calcium 8.8 (L) 8.9 - 10.3 mg/dL   GFR calc non Af Amer >60 >60 mL/min   GFR calc Af Amer >60 >60 mL/min    Comment: (NOTE) The eGFR has been calculated using the CKD EPI equation. This calculation has not been validated in all clinical situations. eGFR's persistently <60 mL/min signify possible Chronic Kidney Disease.    Anion gap 9 5 - 15    Comment: Performed at [facility], [place] [address] [place],  [address]  CBC with Differential/Platelet     Status: Abnormal   Collection Time: 08/13/17  5:20 AM  Result Value Ref Range   WBC 12.8 (H) 4.0 - 10.5 K/uL   RBC 4.09 3.87 - 5.11 MIL/uL  Hemoglobin 8.3 (L) 12.0 - 15.0 g/dL   HCT 26.2 (L) 36.0 - 46.0 %   MCV 64.1 (L) 78.0 - 100.0 fL   MCH 20.3 (L) 26.0 - 34.0 pg   MCHC 31.7 30.0 - 36.0 g/dL   RDW 20.0 (H) 11.5 - 15.5 %   Platelets 364 150 - 400 K/uL   Neutrophils Relative % 60 %     Lymphocytes Relative 34 %   Monocytes Relative 5 %   Eosinophils Relative 1 %   Basophils Relative 0 %   Neutro Abs 7.7 1.7 - 7.7 K/uL   Lymphs Abs 4.4 (H) 0.7 - 4.0 K/uL   Monocytes Absolute 0.6 0.1 - 1.0 K/uL   Eosinophils Absolute 0.1 0.0 - 0.7 K/uL   Basophils Absolute 0.0 0.0 - 0.1 K/uL   RBC Morphology POLYCHROMASIA PRESENT     Comment: TARGET CELLS   WBC Morphology TOXIC GRANULATION     Comment: VACUOLATED NEUTROPHILS Performed at [facility], [place] [address] [place], [place] [address]     MICRO:  IMAGING: Ct Maxillofacial W Contrast  Result Date: 08/12/2017 CLINICAL DATA:  Cellulitis neck. IV drug abuse. Dental work several weeks ago. EXAM: CT MAXILLOFACIAL WITH CONTRAST TECHNIQUE: Multidetector CT imaging of the maxillofacial structures was performed with intravenous contrast. Multiplanar CT image reconstructions were also generated. CONTRAST:  27m ISOVUE-300 IOPAMIDOL (ISOVUE-300) INJECTION 61% COMPARISON:  CT neck 08/10/2017 FINDINGS: Osseous: Multiple dental extractions, which appear recent. No evidence of osteomyelitis in the mandible or maxilla. Visualized cervical spine negative. Orbits: Normal soft tissues of the orbit. Sinuses: Paranasal sinuses clear.  Mastoid sinus clear Soft tissues: Progressive soft tissue swelling involving the chin. There is progressive subcutaneous fluid some which appears to be organized and could represent developing subcutaneous abscess. This component measures approximately 2.4 cm in diameter. To the left of midline, there is a rim enhancing fluid collection measuring 24 x 11 mm just below the left mylohyoid muscle and perhaps extending into the muscle. This also is suspicious for abscess. Small gas bubbles are present in the soft tissues. There is diffuse edema and skin thickening involving the submandibular soft tissues. No extension into the floor of the mouth. Limited intracranial: Negative IMPRESSION: Progression of edema  in the soft tissues below the chin. Progressive subcutaneous fluid collection. Progressive rim enhancing fluid collection inferior to the left mylohyoid muscle compatible with developing abscess. Surgical consultation is recommended. Electronically Signed   By: CFranchot GalloM.D.   On: 08/12/2017 15:15    HISTORICAL MICRO/IMAGING  Assessment/Plan: 43yoF with odontigenic infection with jaw osteomyelitis and submental abscess s/p I x D  - continue on vancomycin and amp/sub for now. Will follow up OR cultures to see if any changes to regimen can be done - she is not a candidate for long term abtx at home due to hx of recent IV drug abuse < 6 months. - may consider oral regimen that has good bone penetration with oral flora coverage. Await cx results to see if anything identified  Substance abuse = recommend to treat pain adequately while in house. She is at risk for relapse, unfortunately. Will need to bridge with pcp. Consider suboxone after acute process. Also recommend checking hep c ab.

## 2017-08-13 NOTE — Progress Notes (Signed)
PROGRESS NOTE [facility]   Terri [name]   CLE:[id number] DOB: 1975-12-08  DOA: 08/10/2017 PCP: [name], MD   Brief Narrative:  Terri [name] is a 42 year old female with medical history significant for hypertension, bipolar disorder and IV drug user presented  to the emergency department complaining of neck pain, swelling and erythema.  Upon ED evaluation CT scan showed neck cellulitis with no abscess.  Patient was admitted for working diagnosis of neck cellulitis, developed submental abscess and found to have osteomyelitis now status post I&D  Subjective: Status post I&D of submental abscess.  On ENT evaluation found exposed bone on lingual surface of left mandible.   Assessment & Plan: Cellulitis of the neck/submandibular abscess and mandibular osteomyelitis Felt to be related to alternate infection Was treated with clindamycin, currently on Unasyn and vancomycin.  She is status post I&D of submandibular abscess.  I have discussed case with ID no need for further just to confirm osteomyelitis as patient has exposed bone.  Recommending to continue current antibiotic treatment.  Await cultures for antibiotic decision.  This will be a difficult situation as patient is IV drug user within the past 6 months will need to discharge patient on oral antibiotics.   Pain management Will schedule Tylenol 1000 mg every 6 hours for 1 days Change Percocet to OxyContin 10 mg every 12 Continue Dilaudid 1 mg but will change to every 3 hours as needed Patient will need pain management as an outpatient, she could be a good candidate for Suboxone  Hypertension BP remained acceptable, continue lisinopril, HCTZ and amlodipine  Hypokalemia Replete  Anxiety/depression Patient on Elavil and Cymbalta will continue   DVT prophylaxis: Lovenox Code Status: Full code Family Communication: None at bedside Disposition Plan: Home when infectious source under control  Consultants:    None  Procedures:   None  Antimicrobials: Anti-infectives (From admission, onward)   Start     Dose/Rate Route Frequency Ordered Stop   08/12/17 0200  vancomycin (VANCOCIN) IVPB 1000 mg/200 mL premix     1,000 mg 200 mL/hr over 60 Minutes Intravenous Every 12 hours 08/11/17 1124     08/11/17 1130  vancomycin (VANCOCIN) 1,750 mg in sodium chloride 0.9 % 500 mL IVPB     1,750 mg 250 mL/hr over 120 Minutes Intravenous  Once 08/11/17 1045 08/11/17 1600   08/11/17 0600  Ampicillin-Sulbactam (UNASYN) 3 g in sodium chloride 0.9 % 100 mL IVPB     3 g 200 mL/hr over 30 Minutes Intravenous Every 6 hours 08/10/17 2257     08/10/17 2245  Ampicillin-Sulbactam (UNASYN) 3 g in sodium chloride 0.9 % 100 mL IVPB     3 g 200 mL/hr over 30 Minutes Intravenous  Once 08/10/17 2231 08/10/17 2352   08/10/17 1830  clindamycin (CLEOCIN) IVPB 600 mg     600 mg 100 mL/hr over 30 Minutes Intravenous  Once 08/10/17 1818 08/10/17 1929       Objective: Vitals:   08/13/17 1115 08/13/17 1130 08/13/17 1145 08/13/17 1446  BP: (!) 158/96 (!) 154/95 (!) 159/95 (!) 151/98  Pulse: 73 71 69 86  Resp: 14 12 14 14   Temp: 97.7 F (36.5 C) 97.8 F (36.6 C) 98.1 F (36.7 C)   TempSrc:      SpO2: 95% 94% 94%   Weight:      Height:        Intake/Output Summary (Last 24 hours) at 08/13/2017 1553 Last data filed at 08/13/2017 1300 Gross per 24 hour  Intake 1620 ml  Output 5 ml  Net 1615 ml   Filed Weights   08/10/17 1427  Weight: 88 kg (194 lb)    Examination:  General: Anxious and crying Neck: Dressing with mild blood tinge, Penrose in place, limited opening of mouth due to pain Cardiovascular: RRR, S1/S2 +, no rubs, no gallops Respiratory: CTA bilaterally, no wheezing, no rhonchi  Data Reviewed: I have personally reviewed following labs and imaging studies  CBC: Recent Labs  Lab 08/10/17 1848 08/11/17 0600 08/12/17 0607 08/13/17 0520  WBC 26.5* 19.1* 16.9* 12.8*  NEUTROABS 22.8*  --   12.8* 7.7  HGB 9.1* 8.3* 8.4* 8.3*  HCT 28.2* 25.8* 26.5* 26.2*  MCV 63.2* 63.5* 63.9* 64.1*  PLT 313 293 342 778   Basic Metabolic Panel: Recent Labs  Lab 08/10/17 1848 08/12/17 0607 08/13/17 0520  NA 140 140 140  K 2.9* 3.0* 3.2*  CL 101 103 102  CO2 29 28 29   GLUCOSE 105* 78 77  BUN 9 7 10   CREATININE 0.64 0.47 0.54  CALCIUM 9.0 8.8* 8.8*  MG  --  1.9 1.8   GFR: Estimated Creatinine Clearance: 107.2 mL/min (by C-G formula based on SCr of 0.54 mg/dL). Liver Function Tests: Recent Labs  Lab 08/10/17 1848  AST 15  ALT 9*  ALKPHOS 120  BILITOT 0.6  PROT 6.3*  ALBUMIN 3.0*   No results for input(s): LIPASE, AMYLASE in the last 168 hours. No results for input(s): AMMONIA in the last 168 hours. Coagulation Profile: No results for input(s): INR, PROTIME in the last 168 hours. Cardiac Enzymes: No results for input(s): CKTOTAL, CKMB, CKMBINDEX, TROPONINI in the last 168 hours. BNP (last 3 results) No results for input(s): PROBNP in the last 8760 hours. HbA1C: No results for input(s): HGBA1C in the last 72 hours. CBG: No results for input(s): GLUCAP in the last 168 hours. Lipid Profile: No results for input(s): CHOL, HDL, LDLCALC, TRIG, CHOLHDL, LDLDIRECT in the last 72 hours. Thyroid Function Tests: No results for input(s): TSH, T4TOTAL, FREET4, T3FREE, THYROIDAB in the last 72 hours. Anemia Panel: No results for input(s): VITAMINB12, FOLATE, FERRITIN, TIBC, IRON, RETICCTPCT in the last 72 hours. Sepsis Labs: Recent Labs  Lab 08/10/17 1901  LATICACIDVEN 1.07    Recent Results (from the past 240 hour(s))  Blood Culture (routine x 2)     Status: None (Preliminary result)   Collection Time: 08/10/17  6:33 PM  Result Value Ref Range Status   Specimen Description   Final    BLOOD RIGHT ANTECUBITAL Performed at [facility], [place] [address] [place], [place] [address]    Special Requests   Final    BOTTLES DRAWN AEROBIC AND ANAEROBIC Blood  Culture adequate volume Performed at [facility] [address] [place], [place] [address]    Culture   Final    NO GROWTH 3 DAYS Performed at [facility], [place] [address], [place], [place] [address]    Report Status PENDING  Incomplete  Blood Culture (routine x 2)     Status: None (Preliminary result)   Collection Time: 08/11/17  6:00 AM  Result Value Ref Range Status   Specimen Description   Final    BLOOD LEFT HAND Performed at [facility] [address], [place], [place] [address]    Special Requests   Final    BOTTLES DRAWN AEROBIC AND ANAEROBIC Blood Culture adequate volume Performed at [facility]  [address] [place], [place] [address]    Culture   Final    NO GROWTH 2 DAYS Performed at [facility] [address], [place], [place] [address]    Report Status PENDING  Incomplete  Surgical PCR screen     Status: None   Collection Time: 08/12/17  8:21 PM  Result Value Ref Range Status   MRSA, PCR NEGATIVE NEGATIVE Final   Staphylococcus aureus NEGATIVE NEGATIVE Final    Comment: (NOTE) The Xpert SA Assay (FDA approved for NASAL specimens in patients 75 years of age and older), is one component of a comprehensive surveillance program. It is not intended to diagnose infection nor to guide or monitor treatment. Performed at [facility], [place] [address], [place], [place] [address]       Radiology Studies: Ct Maxillofacial W Contrast  Result Date: 08/12/2017 CLINICAL DATA:  Cellulitis neck. IV drug abuse. Dental work several weeks ago. EXAM: CT MAXILLOFACIAL WITH CONTRAST TECHNIQUE: Multidetector CT imaging of the maxillofacial structures was performed with intravenous contrast. Multiplanar CT image reconstructions were also generated. CONTRAST:  77mL ISOVUE-300 IOPAMIDOL (ISOVUE-300) INJECTION 61% COMPARISON:  CT neck 08/10/2017 FINDINGS: Osseous: Multiple dental extractions,  which appear recent. No evidence of osteomyelitis in the mandible or maxilla. Visualized cervical spine negative. Orbits: Normal soft tissues of the orbit. Sinuses: Paranasal sinuses clear.  Mastoid sinus clear Soft tissues: Progressive soft tissue swelling involving the chin. There is progressive subcutaneous fluid some which appears to be organized and could represent developing subcutaneous abscess. This component measures approximately 2.4 cm in diameter. To the left of midline, there is a rim enhancing fluid collection measuring 24 x 11 mm just below the left mylohyoid muscle and perhaps extending into the muscle. This also is suspicious for abscess. Small gas bubbles are present in the soft tissues. There is diffuse edema and skin thickening involving the submandibular soft tissues. No extension into the floor of the mouth. Limited intracranial: Negative IMPRESSION: Progression of edema in the soft tissues below the chin. Progressive subcutaneous fluid collection. Progressive rim enhancing fluid collection inferior to the left mylohyoid muscle compatible with developing abscess. Surgical consultation is recommended. Electronically Signed   By: Franchot Gallo M.D.   On: 08/12/2017 15:15      Scheduled Meds: . acetaminophen  1,000 mg Oral Q6H  . amitriptyline  75 mg Oral QHS  . amLODipine  10 mg Oral Daily  . docusate sodium  100 mg Oral BID  . DULoxetine  60 mg Oral Daily  . enoxaparin (LOVENOX) injection  40 mg Subcutaneous Daily  . hydrALAZINE      . lisinopril  10 mg Oral Daily   And  . hydrochlorothiazide  12.5 mg Oral Daily  . HYDROmorphone      . HYDROmorphone      . ketorolac      . midazolam      . nicotine  21 mg Transdermal Daily  . oxyCODONE  10 mg Oral Q12H  . pantoprazole  40 mg Oral BID  . polyethylene glycol  17 g Oral Daily  . potassium chloride  40 mEq Oral Q4H  . predniSONE  20 mg Oral Q breakfast   Continuous Infusions: . ampicillin-sulbactam (UNASYN) IV Stopped  (08/13/17 0413)  . vancomycin 1,000 mg (08/13/17 1403)     LOS: 1 day    Time spent: Total of 25 minutes spent with pt, greater than 50% of which was spent in discussion of  treatment, counseling  and coordination of care   Chipper Oman, MD Pager: Text Page via [email or web address]   If 7PM-7AM, please contact night-coverage [email or web address] 08/13/2017, 3:53 PM   Note - This record has been created using [employer]. Chart creation errors have been sought, but may not always have been located. Such creation errors do not reflect on the standard of medical care.

## 2017-08-13 NOTE — Progress Notes (Signed)
Patient returned to room from [place] screaming at 2 RNs and a Stage manager, stating "I'm leaving, I ain't getting no damn help for pain, you cut my damn face open." Patient began violently pulling at IV line and SCD wraps and walked to bathroom. RN saline locked IV and SCDs were removed. RN stated pain medication regimen, see MAR. Patient continues to curse.

## 2017-08-13 NOTE — Brief Op Note (Signed)
08/13/2017  9:38 AM  PATIENT:  [name]  42 y.o. female  PRE-OPERATIVE DIAGNOSIS:  left neck abscess  POST-OPERATIVE DIAGNOSIS:  left neck abscess  PROCEDURE:  Procedure(s): INCISION AND DRAINAGE OF LEFT NECK ABSCESS (Left)  SURGEON:  Surgeon(s) and Role:    Melida Quitter, MD - Primary  PHYSICIAN ASSISTANT:   ASSISTANTS: none   ANESTHESIA:   general  EBL:  Minimal   BLOOD ADMINISTERED:none  DRAINS: Penrose drain in the left submental neck   LOCAL MEDICATIONS USED:  LIDOCAINE   SPECIMEN:  No Specimen  DISPOSITION OF SPECIMEN:  N/A  COUNTS:  YES  TOURNIQUET:  * No tourniquets in log *  DICTATION: .Other Dictation: Dictation Number [id number]  PLAN OF CARE: Return to hospital room  PATIENT DISPOSITION:  PACU - hemodynamically stable.   Delay start of Pharmacological VTE agent (>24hrs) due to surgical blood loss or risk of bleeding: no

## 2017-08-13 NOTE — Progress Notes (Signed)
Becoming increasingly agitated, using fowl language and accusing staff of lying to her, demanding to get a doctor who can get her out of here, " I can go home and do better than this:.

## 2017-08-13 NOTE — Anesthesia Preprocedure Evaluation (Addendum)
Anesthesia Evaluation  Patient identified by MRN, date of birth, ID band Patient awake    Reviewed: Allergy & Precautions, NPO status , Patient's Chart, lab work & pertinent test results  Airway Mallampati: II       Dental  (+) Poor Dentition, Missing, Chipped, Dental Advisory Given   Pulmonary Current Smoker,    Pulmonary exam normal breath sounds clear to auscultation       Cardiovascular hypertension, Pt. on medications Normal cardiovascular exam Rhythm:Regular Rate:Normal     Neuro/Psych PSYCHIATRIC DISORDERS Anxiety Depression Bipolar Disorder    GI/Hepatic negative GI ROS, Neg liver ROS,   Endo/Other  negative endocrine ROS  Renal/GU negative Renal ROS  Female GU complaint     Musculoskeletal negative musculoskeletal ROS (+)   Abdominal Normal abdominal exam  (+)   Peds  Hematology  (+) Blood dyscrasia, anemia ,   Anesthesia Other Findings   Reproductive/Obstetrics                                                            Anesthesia Evaluation  Patient identified by MRN, date of birth, ID band Patient awake    Reviewed: Allergy & Precautions, NPO status , Patient's Chart, lab work & pertinent test results  Airway Mallampati: II  TM Distance: >3 FB Neck ROM: Full    Dental  (+) Dental Advisory Given, Poor Dentition, Chipped, Missing   Pulmonary Current Smoker,    Pulmonary exam normal breath sounds clear to auscultation       Cardiovascular hypertension, Normal cardiovascular exam Rhythm:Regular Rate:Normal     Neuro/Psych Bipolar Disorder Chronic narcotic use negative neurological ROS     GI/Hepatic negative GI ROS, Neg liver ROS,   Endo/Other  negative endocrine ROS  Renal/GU negative Renal ROS  negative genitourinary   Musculoskeletal negative musculoskeletal ROS (+)   Abdominal   Peds negative pediatric ROS (+)  Hematology negative  hematology ROS (+)   Anesthesia Other Findings   Reproductive/Obstetrics negative OB ROS                            Anesthesia Physical Anesthesia Plan  ASA: III  Anesthesia Plan: General   Post-op Pain Management:    Induction: Intravenous  PONV Risk Score and Plan: 2 and Ondansetron, Dexamethasone, Treatment may vary due to age or medical condition and Midazolam  Airway Management Planned: Oral ETT  Additional Equipment:   Intra-op Plan:   Post-operative Plan: Extubation in OR  Informed Consent: I have reviewed the patients History and Physical, chart, labs and discussed the procedure including the risks, benefits and alternatives for the proposed anesthesia with the patient or authorized representative who has indicated his/her understanding and acceptance.   Dental advisory given  Plan Discussed with: CRNA and Surgeon  Anesthesia Plan Comments:         Anesthesia Quick Evaluation  Anesthesia Physical Anesthesia Plan  ASA: II  Anesthesia Plan: General   Post-op Pain Management:    Induction: Intravenous  PONV Risk Score and Plan: 3 and Ondansetron, Dexamethasone and Midazolam  Airway Management Planned: LMA  Additional Equipment:   Intra-op Plan:   Post-operative Plan:   Informed Consent: I have reviewed the patients History and Physical, chart, labs and discussed the procedure  including the risks, benefits and alternatives for the proposed anesthesia with the patient or authorized representative who has indicated his/her understanding and acceptance.   Dental advisory given  Plan Discussed with: CRNA and Surgeon  Anesthesia Plan Comments:         Anesthesia Quick Evaluation

## 2017-08-13 NOTE — Op Note (Signed)
[name], [name] NO.:  [id number]  MEDICAL RECORD NO.:  [id number]  LOCATION:  3810                         FACILITY:  [facility]  PHYSICIAN:  Onnie Graham, MD     DATE OF BIRTH:  05-Apr-1976  DATE OF PROCEDURE:  08/13/2017 DATE OF DISCHARGE:                              OPERATIVE REPORT   PREOPERATIVE DIAGNOSIS:  Left submental neck abscess.  POSTOPERATIVE DIAGNOSIS:  Left submental neck abscess.  PROCEDURE:  Incision and drainage of left submental neck abscess.  SURGEON:  Onnie Graham, MD.  ANESTHESIA:  General LMA anesthesia.  COMPLICATIONS:  None.  INDICATIONS:  The patient is a 42 year old female, who had some dental work done about a month ago.  Early this week, she developed numbness of the left lower lip and the next day swelling under her chin with pain that worsened.  She was admitted to the hospital mid week and started on intravenous Unasyn and vancomycin.  A repeat scan yesterday demonstrated a well-defined abscess and she presents to the operating room for surgical management.  FINDINGS:  Copious malodorous pus was encountered in the submental space.  Cultures were obtained.  Intraoral exam confirms an area of exposed mandibular bone on the left lingual surface and there is pus expressible from a tooth socket just behind her last tooth on that left side.  These findings strongly suggest osteomyelitis of the mandible.  DESCRIPTION OF PROCEDURE:  The patient was identified in the holding room.  Informed consent having been obtained including discussion of risks, benefits, alternatives, the patient was brought to the operative suite and put on the operative table in supine position.  Anesthesia was induced.  The patient was intubated by the Anesthesia team with an LMA without difficulty.  The patient had already received intravenous antibiotics.  A shoulder roll was placed and the neck was prepped and draped in a sterile fashion.  The  incision was marked with a marking pen and injected with 1% lidocaine with 1:100,000 epinephrine.  Incision was made with a 15 blade scalpel and extended into the abscess using a hemostat.  Immediately, copious, malodorous pus was encountered. Culture swabs were obtained.  Pus was pushed out of the abscess cavity. The cavity was then copiously irrigated with saline using a red rubber catheter.  The cavity was then dissected in a superior direction using hemostats up to the mandible as well as medial to the mandible.  The abscess cavity was again copiously irrigated with saline.  A 0.25-inch Penrose drain was then placed into the depth of the abscess and secured to the skin using a 2-0 nylon suture.  Drapes removed and the patient was cleaned off.  A Kerlix fluff dressing was placed around the neck.  The oral cavity was then examined carefully with findings noted above.  At this point, the patient was returned to Anesthesia for wake-up, was extubated and moved to the recovery room in stable condition.     Onnie Graham, MD     DDB/MEDQ  D:  08/13/2017  T:  08/13/2017  Job:  [id number]

## 2017-08-13 NOTE — Progress Notes (Signed)
Upon arrival to room patient continues to be very verbally abusive stating, "there is no damn way you've been giving me anything for pain, I don't care what you say>"

## 2017-08-14 ENCOUNTER — Encounter (HOSPITAL_COMMUNITY): Payer: Self-pay | Admitting: Otolaryngology

## 2017-08-14 LAB — BASIC METABOLIC PANEL
Anion gap: 10 (ref 5–15)
BUN: 11 mg/dL (ref 6–20)
CALCIUM: 9.3 mg/dL (ref 8.9–10.3)
CO2: 29 mmol/L (ref 22–32)
CREATININE: 0.55 mg/dL (ref 0.44–1.00)
Chloride: 100 mmol/L — ABNORMAL LOW (ref 101–111)
GFR calc Af Amer: >60 mL/min (ref >60)
GLUCOSE: 104 mg/dL — AB (ref 65–99)
Potassium: 4.4 mmol/L (ref 3.5–5.1)
Sodium: 139 mmol/L (ref 135–145)

## 2017-08-14 LAB — CBC
HCT: 29.2 % — ABNORMAL LOW (ref 36.0–46.0)
Hemoglobin: 9.1 g/dL — ABNORMAL LOW (ref 12.0–15.0)
MCH: 20 pg — ABNORMAL LOW (ref 26.0–34.0)
MCHC: 31.2 g/dL (ref 30.0–36.0)
MCV: 64.2 fL — ABNORMAL LOW (ref 78.0–100.0)
PLATELETS: 441 10*3/uL — AB (ref 150–400)
RBC: 4.55 MIL/uL (ref 3.87–5.11)
RDW: 20.2 % — ABNORMAL HIGH (ref 11.5–15.5)
WBC: 15.9 10*3/uL — ABNORMAL HIGH (ref 4.0–10.5)

## 2017-08-14 LAB — MAGNESIUM: Magnesium: 1.8 mg/dL (ref 1.7–2.4)

## 2017-08-14 MED ORDER — ACETAMINOPHEN 500 MG PO TABS
1000.0000 mg | ORAL_TABLET | Freq: Four times a day (QID) | ORAL | Status: AC
  Administered 2017-08-14 – 2017-08-15 (×4): 1000 mg via ORAL
  Filled 2017-08-14 (×4): qty 2

## 2017-08-14 NOTE — Progress Notes (Signed)
Pharmacy Antibiotic Note  Terri [name] is a 42 y.o. female admitted on 08/10/2017 with odontogenic infection.  Pharmacy has been consulted for Unasyn and vancomycin dosing.    Plan:  Vancomycin 1750mg  IV x 1, then 1g IV q12h  Check levels at steady state, goal AUC 400-500  Continue Unasyn 3g IV q6h  Follow up renal function & cultures, clinical course  Height: 5\' 9"  (175.3 cm) Weight: 194 lb (88 kg) IBW/kg (Calculated) : 66.2  Temp (24hrs), Avg:98 F (36.7 C), Min:97.5 F (36.4 C), Max:98.4 F (36.9 C)  Recent Labs  Lab 08/10/17 1848 08/10/17 1901 08/11/17 0600 08/12/17 0607 08/13/17 0520 08/14/17 0532  WBC 26.5*  --  19.1* 16.9* 12.8* 15.9*  CREATININE 0.64  --   --  0.47 0.54 0.55  LATICACIDVEN  --  1.07  --   --   --   --     Estimated Creatinine Clearance: 107.2 mL/min (by C-G formula based on SCr of 0.55 mg/dL).    Allergies  Allergen Reactions  . Ketorolac Tromethamine Itching and Nausea And Vomiting  . Methocarbamol Diarrhea    Antimicrobials this admission: 3/20 Clinda x 1 3/20 Unasyn >>  3/21 Vancomycin >>  Dose adjustments this admission:  Microbiology results: 3/21 BCx: NGTD 3/22 MRSA surgical PCR: negative 3/23 Abscess culture: abundant Gr+ cocci in clusters    Thank you for allowing pharmacy to be a part of this patient's care.   Royetta Asal, PharmD, BCPS Pager [phone] 08/14/2017 3:07 PM

## 2017-08-14 NOTE — Progress Notes (Signed)
Patient verbally abusive, cursing at RN about pain medication. RN advised that pain medication is due at 2030 and is in room to give. Patient continues to be abusive. RN advised that the behavior cannot continue and security will be contacted if she continues to do so. Patient states "just give damn pain medicine and leave". RN administered pain medication as ordered and advised that she will be back to check on patient. Will c/t monitor.

## 2017-08-14 NOTE — Progress Notes (Signed)
   Subjective:    Patient ID: Terri [name], female    DOB: 03-12-1976, 42 y.o.   MRN: [id number]  HPI Feeling better today with less pain.  Has required dressing change a couple of times yesterday.  Review of Systems     Objective:   Physical Exam AF VSS Alert, NAD Penrose drain in place with quite a bit of purulent drainage on dressing. Submental swelling much improved, remains tender.    Assessment & Plan:  Submental abscess s/ I&D, osteomyelitis of mandible  Will leave Penrose drain in place until drainage decreases.  Appreciate ID input.  Cultures pending.

## 2017-08-14 NOTE — Progress Notes (Signed)
ID PROGRESS NOTE  cx a re-incubating for better identification  Remains afebrile Drain remains in place  A/p:jaw osteomyelitis with submental abscess s/p I XD - continue on current abtx of vancomycin plus amp/sub for now - await micro data to help narrow abtx  Dr Johnnye Sima to see tomorrow

## 2017-08-14 NOTE — Progress Notes (Signed)
PROGRESS NOTE [facility]   Terri [name]   MEQ:[id number] DOB: 06-24-1975  DOA: 08/10/2017 PCP: Terri Ebbs, MD   Brief Narrative:  Terri [name] is a 42 year old female with medical history significant for hypertension, bipolar disorder and IV drug user presented  to the emergency department complaining of neck pain, swelling and erythema.  Upon ED evaluation CT scan showed neck cellulitis with no abscess.  Patient was admitted for working diagnosis of neck cellulitis, developed submental abscess and found to have osteomyelitis now status post I&D  Subjective: Patient seen and examined, doing much better with current pain management.  Continues to drain.  No other concerns  Assessment & Plan: Cellulitis of the neck/submandibular abscess and mandibular osteomyelitis Felt to be related to alternate infection Was treated with clindamycin, currently on Unasyn and vancomycin.  She is status post I&D of submandibular abscess.  Cultures are pending Gram stain identified gram-positive cocci in pairs reintubated for better growth.  Blood cultures negative so far.  Continue current antibiotic therapy and await for wound cultures.  Pain management - improved Continue OxyContin every 12, Dilaudid 1 mg every 3hr as needed for now.  Patient also getting Tylenol 1 g schedule for 4 doses.  Will discuss with internal medicine service for Suboxone consult in a.m.  Hypertension BP remains stable, continue lisinopril, HCTZ and amlodipine  Hypokalemia Replete  Anxiety/depression Patient on Elavil and Cymbalta will continue   DVT prophylaxis: Lovenox Code Status: Full code Family Communication: None at bedside Disposition Plan: Pending wound cultures Home versus SNF  Consultants:   None  Procedures:   None  Antimicrobials: Anti-infectives (From admission, onward)   Start     Dose/Rate Route Frequency Ordered Stop   08/12/17 0200  vancomycin (VANCOCIN) IVPB 1000 mg/200 mL premix      1,000 mg 200 mL/hr over 60 Minutes Intravenous Every 12 hours 08/11/17 1124     08/11/17 1130  vancomycin (VANCOCIN) 1,750 mg in sodium chloride 0.9 % 500 mL IVPB     1,750 mg 250 mL/hr over 120 Minutes Intravenous  Once 08/11/17 1045 08/11/17 1600   08/11/17 0600  Ampicillin-Sulbactam (UNASYN) 3 g in sodium chloride 0.9 % 100 mL IVPB     3 g 200 mL/hr over 30 Minutes Intravenous Every 6 hours 08/10/17 2257     08/10/17 2245  Ampicillin-Sulbactam (UNASYN) 3 g in sodium chloride 0.9 % 100 mL IVPB     3 g 200 mL/hr over 30 Minutes Intravenous  Once 08/10/17 2231 08/10/17 2352   08/10/17 1830  clindamycin (CLEOCIN) IVPB 600 mg     600 mg 100 mL/hr over 30 Minutes Intravenous  Once 08/10/17 1818 08/10/17 1929       Objective: Vitals:   08/13/17 1446 08/13/17 1951 08/14/17 0629 08/14/17 0839  BP: (!) 151/98 (!) 145/94 135/74 (!) 141/86  Pulse: 86 76 70   Resp: 14 15 15    Temp:  98.2 F (36.8 C) (!) 97.5 F (36.4 C)   TempSrc:  Oral Oral   SpO2:  97% 98%   Weight:      Height:        Intake/Output Summary (Last 24 hours) at 08/14/2017 1415 Last data filed at 08/13/2017 2300 Gross per 24 hour  Intake 360 ml  Output -  Net 360 ml   Filed Weights   08/10/17 1427  Weight: 88 kg (194 lb)    Examination:  General: Pt is alert, awake, not in acute distress HEENT: [place] in place  draining well.  Swelling and erythema has come down Cardiovascular: RRR, S1/S2 +, no rubs, no gallops Respiratory: CTA bilaterally, no wheezing, no rhonchi   Data Reviewed: I have personally reviewed following labs and imaging studies  CBC: Recent Labs  Lab 08/10/17 1848 08/11/17 0600 08/12/17 0607 08/13/17 0520 08/14/17 0532  WBC 26.5* 19.1* 16.9* 12.8* 15.9*  NEUTROABS 22.8*  --  12.8* 7.7  --   HGB 9.1* 8.3* 8.4* 8.3* 9.1*  HCT 28.2* 25.8* 26.5* 26.2* 29.2*  MCV 63.2* 63.5* 63.9* 64.1* 64.2*  PLT 313 293 342 364 786*   Basic Metabolic Panel: Recent Labs  Lab 08/10/17 1848  08/12/17 0607 08/13/17 0520 08/14/17 0532  NA 140 140 140 139  K 2.9* 3.0* 3.2* 4.4  CL 101 103 102 100*  CO2 29 28 29 29   GLUCOSE 105* 78 77 104*  BUN 9 7 10 11   CREATININE 0.64 0.47 0.54 0.55  CALCIUM 9.0 8.8* 8.8* 9.3  MG  --  1.9 1.8 1.8   GFR: Estimated Creatinine Clearance: 107.2 mL/min (by C-G formula based on SCr of 0.55 mg/dL). Liver Function Tests: Recent Labs  Lab 08/10/17 1848  AST 15  ALT 9*  ALKPHOS 120  BILITOT 0.6  PROT 6.3*  ALBUMIN 3.0*   No results for input(s): LIPASE, AMYLASE in the last 168 hours. No results for input(s): AMMONIA in the last 168 hours. Coagulation Profile: No results for input(s): INR, PROTIME in the last 168 hours. Cardiac Enzymes: No results for input(s): CKTOTAL, CKMB, CKMBINDEX, TROPONINI in the last 168 hours. BNP (last 3 results) No results for input(s): PROBNP in the last 8760 hours. HbA1C: No results for input(s): HGBA1C in the last 72 hours. CBG: No results for input(s): GLUCAP in the last 168 hours. Lipid Profile: No results for input(s): CHOL, HDL, LDLCALC, TRIG, CHOLHDL, LDLDIRECT in the last 72 hours. Thyroid Function Tests: No results for input(s): TSH, T4TOTAL, FREET4, T3FREE, THYROIDAB in the last 72 hours. Anemia Panel: No results for input(s): VITAMINB12, FOLATE, FERRITIN, TIBC, IRON, RETICCTPCT in the last 72 hours. Sepsis Labs: Recent Labs  Lab 08/10/17 1901  LATICACIDVEN 1.07    Recent Results (from the past 240 hour(s))  Blood Culture (routine x 2)     Status: None (Preliminary result)   Collection Time: 08/10/17  6:33 PM  Result Value Ref Range Status   Specimen Description   Final    BLOOD RIGHT ANTECUBITAL Performed at [facility], [place] [address] [place], [place] [address]    Special Requests   Final    BOTTLES DRAWN AEROBIC AND ANAEROBIC Blood Culture adequate volume Performed at [facility] [address], [place], [place] [address]    Culture    Final    NO GROWTH 4 DAYS Performed at [facility], [place] [address] [place], [place] [address]    Report Status PENDING  Incomplete  Blood Culture (routine x 2)     Status: None (Preliminary result)   Collection Time: 08/11/17  6:00 AM  Result Value Ref Range Status   Specimen Description   Final    BLOOD LEFT HAND Performed at [facility] [address] [place], [place] [address]    Special Requests   Final    BOTTLES DRAWN AEROBIC AND ANAEROBIC Blood Culture adequate volume Performed at [facility] [address] [place], [place] [address]    Culture   Final    NO GROWTH 3 DAYS  Performed at [facility], [place] [address], [place], [place] [address]    Report Status PENDING  Incomplete  Surgical PCR screen     Status: None   Collection Time: 08/12/17  8:21 PM  Result Value Ref Range Status   MRSA, PCR NEGATIVE NEGATIVE Final   Staphylococcus aureus NEGATIVE NEGATIVE Final    Comment: (NOTE) The Xpert SA Assay (FDA approved for NASAL specimens in patients 89 years of age and older), is one component of a comprehensive surveillance program. It is not intended to diagnose infection nor to guide or monitor treatment. Performed at [facility], [place] [address], [place], [place] [address]   Aerobic/Anaerobic Culture (surgical/deep wound)     Status: None (Preliminary result)   Collection Time: 08/13/17  9:24 AM  Result Value Ref Range Status   Specimen Description   Final    ABSCESS LEFT NECK Performed at [facility] [address] [place], [place] [address]    Special Requests   Final    PATIENT ON FOLLOWING VANCOMYCIN Performed at [facility], [place] [address], [place], [place] [address]    Gram Stain   Final    MODERATE WBC PRESENT, PREDOMINANTLY PMN ABUNDANT GRAM POSITIVE COCCI IN PAIRS    Culture   Final    CULTURE REINCUBATED FOR BETTER GROWTH Performed  at [facility], [place] [address], [place], [place] [address]    Report Status PENDING  Incomplete      Radiology Studies: Ct Maxillofacial W Contrast  Result Date: 08/12/2017 CLINICAL DATA:  Cellulitis neck. IV drug abuse. Dental work several weeks ago. EXAM: CT MAXILLOFACIAL WITH CONTRAST TECHNIQUE: Multidetector CT imaging of the maxillofacial structures was performed with intravenous contrast. Multiplanar CT image reconstructions were also generated. CONTRAST:  73mL ISOVUE-300 IOPAMIDOL (ISOVUE-300) INJECTION 61% COMPARISON:  CT neck 08/10/2017 FINDINGS: Osseous: Multiple dental extractions, which appear recent. No evidence of osteomyelitis in the mandible or maxilla. Visualized cervical spine negative. Orbits: Normal soft tissues of the orbit. Sinuses: Paranasal sinuses clear.  Mastoid sinus clear Soft tissues: Progressive soft tissue swelling involving the chin. There is progressive subcutaneous fluid some which appears to be organized and could represent developing subcutaneous abscess. This component measures approximately 2.4 cm in diameter. To the left of midline, there is a rim enhancing fluid collection measuring 24 x 11 mm just below the left mylohyoid muscle and perhaps extending into the muscle. This also is suspicious for abscess. Small gas bubbles are present in the soft tissues. There is diffuse edema and skin thickening involving the submandibular soft tissues. No extension into the floor of the mouth. Limited intracranial: Negative IMPRESSION: Progression of edema in the soft tissues below the chin. Progressive subcutaneous fluid collection. Progressive rim enhancing fluid collection inferior to the left mylohyoid muscle compatible with developing abscess. Surgical consultation is recommended. Electronically Signed   By: Franchot Gallo M.D.   On: 08/12/2017 15:15      Scheduled Meds: . acetaminophen  1,000 mg Oral Q6H  . amitriptyline  75 mg Oral QHS  . amLODipine  10 mg Oral  Daily  . docusate sodium  100 mg Oral BID  . DULoxetine  60 mg Oral Daily  . enoxaparin (LOVENOX) injection  40 mg Subcutaneous Daily  . lisinopril  10 mg Oral Daily   And  . hydrochlorothiazide  12.5 mg Oral Daily  . nicotine  21 mg Transdermal Daily  . oxyCODONE  10 mg Oral Q12H  .  pantoprazole  40 mg Oral BID  . polyethylene glycol  17 g Oral Daily  . predniSONE  20 mg Oral Q breakfast   Continuous Infusions: . ampicillin-sulbactam (UNASYN) IV Stopped (08/14/17 1103)  . vancomycin Stopped (08/14/17 0335)     LOS: 2 days    Time spent: Total of 25 minutes spent with pt, greater than 50% of which was spent in discussion of  treatment, counseling and coordination of care  Chipper Oman, MD Pager: Text Page via [email or web address]   If 7PM-7AM, please contact night-coverage [email or web address] 08/14/2017, 2:15 PM   Note - This record has been created using [employer]. Chart creation errors have been sought, but may not always have been located. Such creation errors do not reflect on the standard of medical care.

## 2017-08-15 DIAGNOSIS — K0889 Other specified disorders of teeth and supporting structures: Secondary | ICD-10-CM

## 2017-08-15 DIAGNOSIS — R1310 Dysphagia, unspecified: Secondary | ICD-10-CM

## 2017-08-15 DIAGNOSIS — M272 Inflammatory conditions of jaws: Secondary | ICD-10-CM

## 2017-08-15 DIAGNOSIS — K047 Periapical abscess without sinus: Secondary | ICD-10-CM

## 2017-08-15 LAB — CULTURE, BLOOD (ROUTINE X 2)
Culture: NO GROWTH
SPECIAL REQUESTS: ADEQUATE

## 2017-08-15 MED ORDER — MORPHINE SULFATE (PF) 4 MG/ML IV SOLN
2.0000 mg | Freq: Once | INTRAVENOUS | Status: AC
  Administered 2017-08-15: 2 mg via INTRAVENOUS
  Filled 2017-08-15: qty 1

## 2017-08-15 NOTE — Care Management Important Message (Signed)
Important Message  Patient Details  Name: Terri [name] MRN: [id number] Date of Birth: [birth year]   Medicare Important Message Given:  Yes    Kerin Salen 08/15/2017, 11:00 AM

## 2017-08-15 NOTE — Progress Notes (Signed)
INFECTIOUS DISEASE PROGRESS NOTE  ID: Terri [name] is a 42 y.o. female with  Principal Problem:   Cellulitis of submandibular region Active Problems:   Opioid abuse with opioid-induced mood disorder ([place])   Odontogenic infection of jaw   Cellulitis and abscess of neck  Subjective: No complaints.  No diarrhea.  Pain with swallowing. No cough or SOB.   Abtx:  Anti-infectives (From admission, onward)   Start     Dose/Rate Route Frequency Ordered Stop   08/12/17 0200  vancomycin (VANCOCIN) IVPB 1000 mg/200 mL premix     1,000 mg 200 mL/hr over 60 Minutes Intravenous Every 12 hours 08/11/17 1124     08/11/17 1130  vancomycin (VANCOCIN) 1,750 mg in sodium chloride 0.9 % 500 mL IVPB     1,750 mg 250 mL/hr over 120 Minutes Intravenous  Once 08/11/17 1045 08/11/17 1600   08/11/17 0600  Ampicillin-Sulbactam (UNASYN) 3 g in sodium chloride 0.9 % 100 mL IVPB     3 g 200 mL/hr over 30 Minutes Intravenous Every 6 hours 08/10/17 2257     08/10/17 2245  Ampicillin-Sulbactam (UNASYN) 3 g in sodium chloride 0.9 % 100 mL IVPB     3 g 200 mL/hr over 30 Minutes Intravenous  Once 08/10/17 2231 08/10/17 2352   08/10/17 1830  clindamycin (CLEOCIN) IVPB 600 mg     600 mg 100 mL/hr over 30 Minutes Intravenous  Once 08/10/17 1818 08/10/17 1929      Medications:  Scheduled: . amitriptyline  75 mg Oral QHS  . amLODipine  10 mg Oral Daily  . docusate sodium  100 mg Oral BID  . DULoxetine  60 mg Oral Daily  . enoxaparin (LOVENOX) injection  40 mg Subcutaneous Daily  . lisinopril  10 mg Oral Daily   And  . hydrochlorothiazide  12.5 mg Oral Daily  . nicotine  21 mg Transdermal Daily  . oxyCODONE  10 mg Oral Q12H  . pantoprazole  40 mg Oral BID  . polyethylene glycol  17 g Oral Daily  . predniSONE  20 mg Oral Q breakfast    Objective: Vital signs in last 24 hours: Temp:  [98.1 F (36.7 C)-98.4 F (36.9 C)] 98.1 F (36.7 C) (03/25 0555) Pulse Rate:  [63-64] 64 (03/25 0555) Resp:  [18]  18 (03/25 0555) BP: (122-137)/(71-81) 122/72 (03/25 1002) SpO2:  [98 %-100 %] 100 % (03/25 0555)   General appearance: alert, cooperative and no distress Throat: abnormal findings: dentition: poor Neck: indurated central area. drain on L. no d/c.  Resp: clear to auscultation bilaterally Cardio: regular rate and rhythm GI: normal findings: bowel sounds normal and soft, non-tender Extremities: edema none  Lab Results Recent Labs    08/13/17 0520 08/14/17 0532  WBC 12.8* 15.9*  HGB 8.3* 9.1*  HCT 26.2* 29.2*  NA 140 139  K 3.2* 4.4  CL 102 100*  CO2 29 29  BUN 10 11  CREATININE 0.54 0.55   Liver Panel No results for input(s): PROT, ALBUMIN, AST, ALT, ALKPHOS, BILITOT, BILIDIR, IBILI in the last 72 hours. Sedimentation Rate No results for input(s): ESRSEDRATE in the last 72 hours. C-Reactive Protein No results for input(s): CRP in the last 72 hours.  Microbiology: Recent Results (from the past 240 hour(s))  Blood Culture (routine x 2)     Status: None (Preliminary result)   Collection Time: 08/10/17  6:33 PM  Result Value Ref Range Status   Specimen Description   Final    BLOOD  RIGHT ANTECUBITAL Performed at [facility] [address], [place], [place] [address]    Special Requests   Final    BOTTLES DRAWN AEROBIC AND ANAEROBIC Blood Culture adequate volume Performed at [facility] [address] [place], [place] [address]    Culture   Final    NO GROWTH 4 DAYS Performed at [facility], [place] [address], [place], [place] [address]    Report Status PENDING  Incomplete  Blood Culture (routine x 2)     Status: None (Preliminary result)   Collection Time: 08/11/17  6:00 AM  Result Value Ref Range Status   Specimen Description   Final    BLOOD LEFT HAND Performed at [facility] [address], [place], [place] [address]    Special Requests   Final    BOTTLES DRAWN AEROBIC AND ANAEROBIC Blood  Culture adequate volume Performed at [facility] [address] [place], [place] [address]    Culture   Final    NO GROWTH 3 DAYS Performed at [facility], [place] [address] [place], [place] [address]    Report Status PENDING  Incomplete  Surgical PCR screen     Status: None   Collection Time: 08/12/17  8:21 PM  Result Value Ref Range Status   MRSA, PCR NEGATIVE NEGATIVE Final   Staphylococcus aureus NEGATIVE NEGATIVE Final    Comment: (NOTE) The Xpert SA Assay (FDA approved for NASAL specimens in patients 106 years of age and older), is one component of a comprehensive surveillance program. It is not intended to diagnose infection nor to guide or monitor treatment. Performed at [facility], [place] [address], [place], [place] [address]   Aerobic/Anaerobic Culture (surgical/deep wound)     Status: None (Preliminary result)   Collection Time: 08/13/17  9:24 AM  Result Value Ref Range Status   Specimen Description   Final    ABSCESS LEFT NECK Performed at [facility] [address] [place], [place] [address]    Special Requests   Final    PATIENT ON FOLLOWING VANCOMYCIN Performed at [facility], [place] [address] [place], [place] [address]    Gram Stain   Final    MODERATE WBC PRESENT, PREDOMINANTLY PMN ABUNDANT GRAM POSITIVE COCCI IN PAIRS    Culture   Final    CULTURE REINCUBATED FOR BETTER GROWTH Performed at [facility], [place] [address] [place],  [address]    Report Status PENDING  Incomplete    Studies/Results: No results found.   Assessment/Plan: Dental space infection, osteo of jaw  Total days of antibiotics: 3 (vanco/unasyn)  Cx showing abundant GPC in pairs.  Await further ID of Cx before making final anbx recc's.  Will repeat her HIV test, check Hep C.  Appreciate ENT f/u.          Bobby Rumpf MD, FACP Infectious Diseases (pager) [phone] [email or web address] 08/15/2017, 12:06 PM  LOS: 3 days

## 2017-08-15 NOTE — Progress Notes (Signed)
PROGRESS NOTE [facility]   Terri [name]   [id number]:[id number] DOB: [birth year]  DOA: 08/10/2017 PCP: Terri Ebbs, MD   Brief Narrative:  Terri [name] is a 42 year old female with medical history significant for hypertension, bipolar disorder and IV drug user presented  to the emergency department complaining of neck pain, swelling and erythema.  Upon ED evaluation CT scan showed neck cellulitis with no abscess.  Patient was admitted for working diagnosis of neck cellulitis, developed submental abscess and found to have osteomyelitis now status post I&D  Subjective: Patient seen and examined she is comfortable, pain is well tolerated.  No acute events overnight.   Assessment & Plan: Cellulitis of the neck/submandibular abscess and mandibular osteomyelitis Felt to be related to odontogenic infection Was treated with clindamycin, currently on Unasyn and vancomycin.  She is status post I&D of submandibular abscess.  Wound culture pending Gram stain with gram-positive cocci in pairs.  Pending results will modify antibiotic therapy.  For now continue Unasyn and vancomycin  Pain management - well control Continue OxyContin every 12 hours, Dilaudid 1 mg every 3 hours as needed, will need to start tapering down short acting opiate and manage with long-acting.  Discussed with IM teaching service for possible Suboxone, will get her to schedule when close to discharge.  Hypertension BP remains stable, continue lisinopril, HCTZ and amlodipine  Hypokalemia -resolved  Anxiety/depression Patient on Elavil and Cymbalta will continue   DVT prophylaxis: Lovenox Code Status: Full code Family Communication: None at bedside Disposition Plan: Pending wound cultures Home versus SNF  Consultants:   None  Procedures:   None  Antimicrobials: Anti-infectives (From admission, onward)   Start     Dose/Rate Route Frequency Ordered Stop   08/12/17 0200  vancomycin (VANCOCIN) IVPB 1000 mg/200 mL  premix     1,000 mg 200 mL/hr over 60 Minutes Intravenous Every 12 hours 08/11/17 1124     08/11/17 1130  vancomycin (VANCOCIN) 1,750 mg in sodium chloride 0.9 % 500 mL IVPB     1,750 mg 250 mL/hr over 120 Minutes Intravenous  Once 08/11/17 1045 08/11/17 1600   08/11/17 0600  Ampicillin-Sulbactam (UNASYN) 3 g in sodium chloride 0.9 % 100 mL IVPB     3 g 200 mL/hr over 30 Minutes Intravenous Every 6 hours 08/10/17 2257     08/10/17 2245  Ampicillin-Sulbactam (UNASYN) 3 g in sodium chloride 0.9 % 100 mL IVPB     3 g 200 mL/hr over 30 Minutes Intravenous  Once 08/10/17 2231 08/10/17 2352   08/10/17 1830  clindamycin (CLEOCIN) IVPB 600 mg     600 mg 100 mL/hr over 30 Minutes Intravenous  Once 08/10/17 1818 08/10/17 1929       Objective: Vitals:   08/14/17 1400 08/14/17 2044 08/15/17 0555 08/15/17 1002  BP: 122/71 137/81 131/78 122/72  Pulse: 64 63 64   Resp: 18 18 18    Temp: 98.4 F (36.9 C) 98.2 F (36.8 C) 98.1 F (36.7 C)   TempSrc: Oral Oral Oral   SpO2: 98% 98% 100%   Weight:      Height:        Intake/Output Summary (Last 24 hours) at 08/15/2017 1354 Last data filed at 08/15/2017 1141 Gross per 24 hour  Intake 1630 ml  Output -  Net 1630 ml   Filed Weights   08/10/17 1427  Weight: 88 kg (194 lb)    Examination:  General: NAD HEENT: No LAD, Penrose in place serosanguineous drainage noted.  Cardiovascular:  RRR, S1/S2 +, no rubs, no gallops Respiratory: CTA bilaterally, no wheezing, no rhonchi  Data Reviewed: I have personally reviewed following labs and imaging studies  CBC: Recent Labs  Lab 08/10/17 1848 08/11/17 0600 08/12/17 0607 08/13/17 0520 08/14/17 0532  WBC 26.5* 19.1* 16.9* 12.8* 15.9*  NEUTROABS 22.8*  --  12.8* 7.7  --   HGB 9.1* 8.3* 8.4* 8.3* 9.1*  HCT 28.2* 25.8* 26.5* 26.2* 29.2*  MCV 63.2* 63.5* 63.9* 64.1* 64.2*  PLT 313 293 342 364 742*   Basic Metabolic Panel: Recent Labs  Lab 08/10/17 1848 08/12/17 0607 08/13/17 0520  08/14/17 0532  NA 140 140 140 139  K 2.9* 3.0* 3.2* 4.4  CL 101 103 102 100*  CO2 29 28 29 29   GLUCOSE 105* 78 77 104*  BUN 9 7 10 11   CREATININE 0.64 0.47 0.54 0.55  CALCIUM 9.0 8.8* 8.8* 9.3  MG  --  1.9 1.8 1.8   GFR: Estimated Creatinine Clearance: 107.2 mL/min (by C-G formula based on SCr of 0.55 mg/dL). Liver Function Tests: Recent Labs  Lab 08/10/17 1848  AST 15  ALT 9*  ALKPHOS 120  BILITOT 0.6  PROT 6.3*  ALBUMIN 3.0*   No results for input(s): LIPASE, AMYLASE in the last 168 hours. No results for input(s): AMMONIA in the last 168 hours. Coagulation Profile: No results for input(s): INR, PROTIME in the last 168 hours. Cardiac Enzymes: No results for input(s): CKTOTAL, CKMB, CKMBINDEX, TROPONINI in the last 168 hours. BNP (last 3 results) No results for input(s): PROBNP in the last 8760 hours. HbA1C: No results for input(s): HGBA1C in the last 72 hours. CBG: No results for input(s): GLUCAP in the last 168 hours. Lipid Profile: No results for input(s): CHOL, HDL, LDLCALC, TRIG, CHOLHDL, LDLDIRECT in the last 72 hours. Thyroid Function Tests: No results for input(s): TSH, T4TOTAL, FREET4, T3FREE, THYROIDAB in the last 72 hours. Anemia Panel: No results for input(s): VITAMINB12, FOLATE, FERRITIN, TIBC, IRON, RETICCTPCT in the last 72 hours. Sepsis Labs: Recent Labs  Lab 08/10/17 1901  LATICACIDVEN 1.07    Recent Results (from the past 240 hour(s))  Blood Culture (routine x 2)     Status: None   Collection Time: 08/10/17  6:33 PM  Result Value Ref Range Status   Specimen Description   Final    BLOOD RIGHT ANTECUBITAL Performed at [facility], [place] [address], [place], [place] [address]    Special Requests   Final    BOTTLES DRAWN AEROBIC AND ANAEROBIC Blood Culture adequate volume Performed at [facility] [address], [place], [place] [address]    Culture   Final    NO GROWTH 5 DAYS Performed at [facility], [place] [address], [place], [place] [address]    Report Status 08/15/2017 FINAL  Final  Blood Culture (routine x 2)     Status: None (Preliminary result)   Collection Time: 08/11/17  6:00 AM  Result Value Ref Range Status   Specimen Description   Final    BLOOD LEFT HAND Performed at [facility] [address], [place], [place] [address]    Special Requests   Final    BOTTLES DRAWN AEROBIC AND ANAEROBIC Blood Culture adequate volume Performed at [facility] [address], [place], [place] [address]    Culture   Final    NO GROWTH 4 DAYS Performed at [facility], [place] [address] [place], [place]  [address]    Report Status PENDING  Incomplete  Surgical PCR screen     Status: None   Collection Time: 08/12/17  8:21 PM  Result Value Ref Range Status   MRSA, PCR NEGATIVE NEGATIVE Final   Staphylococcus aureus NEGATIVE NEGATIVE Final    Comment: (NOTE) The Xpert SA Assay (FDA approved for NASAL specimens in patients 6 years of age and older), is one component of a comprehensive surveillance program. It is not intended to diagnose infection nor to guide or monitor treatment. Performed at [facility], [place] [address], [place], [place] [address]   Aerobic/Anaerobic Culture (surgical/deep wound)     Status: None (Preliminary result)   Collection Time: 08/13/17  9:24 AM  Result Value Ref Range Status   Specimen Description   Final    ABSCESS LEFT NECK Performed at [facility] [address] [place], [place] [address]    Special Requests   Final    PATIENT ON FOLLOWING VANCOMYCIN Performed at [facility], [place] [address] [place], [place] [address]    Gram Stain   Final    MODERATE WBC PRESENT, PREDOMINANTLY PMN ABUNDANT GRAM POSITIVE COCCI IN PAIRS    Culture   Final    CULTURE REINCUBATED FOR BETTER GROWTH Performed at [facility], [place] [address],  [place], [place] [address]    Report Status PENDING  Incomplete      Radiology Studies: No results found.    Scheduled Meds: . amitriptyline  75 mg Oral QHS  . amLODipine  10 mg Oral Daily  . docusate sodium  100 mg Oral BID  . DULoxetine  60 mg Oral Daily  . enoxaparin (LOVENOX) injection  40 mg Subcutaneous Daily  . lisinopril  10 mg Oral Daily   And  . hydrochlorothiazide  12.5 mg Oral Daily  . nicotine  21 mg Transdermal Daily  . oxyCODONE  10 mg Oral Q12H  . pantoprazole  40 mg Oral BID  . polyethylene glycol  17 g Oral Daily  . predniSONE  20 mg Oral Q breakfast   Continuous Infusions: . ampicillin-sulbactam (UNASYN) IV Stopped (08/15/17 0950)  . vancomycin Stopped (08/15/17 0320)     LOS: 3 days    Time spent: Total of 25 minutes spent with pt, greater than 50% of which was spent in discussion of  treatment, counseling and coordination of care  Chipper Oman, MD Pager: Text Page via [email or web address]   If 7PM-7AM, please contact night-coverage [email or web address] 08/15/2017, 1:54 PM   Note - This record has been created using [employer]. Chart creation errors have been sought, but may not always have been located. Such creation errors do not reflect on the standard of medical care.

## 2017-08-15 NOTE — Progress Notes (Signed)
   Subjective:    Patient ID: Terri [name], female    DOB: [birth year], 42 y.o.   MRN: [id number]  HPI  Continues with pain in the mandible.  Drainage decreasing somewhat.  Left lower lip remains numb.  Review of Systems     Objective:   Physical Exam AF VSS Alert, NAD Neck wound with Penrose in place, purulent drainage, can still feel abscess cavity under skin but very little expressible pus    Assessment & Plan:  Submental abscess s/p I&D, mandibular osteomyelitis  Drainage decreasing.  Will leave Penrose drain in place until drainage becomes minimal.  Continue IV antibiotics.  ID involved for long-term management.  Culture pending.

## 2017-08-16 DIAGNOSIS — K122 Cellulitis and abscess of mouth: Secondary | ICD-10-CM

## 2017-08-16 DIAGNOSIS — F1190 Opioid use, unspecified, uncomplicated: Secondary | ICD-10-CM

## 2017-08-16 DIAGNOSIS — M869 Osteomyelitis, unspecified: Secondary | ICD-10-CM

## 2017-08-16 DIAGNOSIS — Z978 Presence of other specified devices: Secondary | ICD-10-CM

## 2017-08-16 DIAGNOSIS — K08409 Partial loss of teeth, unspecified cause, unspecified class: Secondary | ICD-10-CM

## 2017-08-16 DIAGNOSIS — L0211 Cutaneous abscess of neck: Principal | ICD-10-CM

## 2017-08-16 LAB — CULTURE, BLOOD (ROUTINE X 2)
Culture: NO GROWTH
Special Requests: ADEQUATE

## 2017-08-16 LAB — HIV ANTIBODY (ROUTINE TESTING W REFLEX): HIV SCREEN 4TH GENERATION: NONREACTIVE

## 2017-08-16 LAB — HEPATITIS C ANTIBODY

## 2017-08-16 MED ORDER — HYDROMORPHONE HCL 1 MG/ML IJ SOLN
1.0000 mg | Freq: Four times a day (QID) | INTRAMUSCULAR | Status: DC | PRN
  Administered 2017-08-16 – 2017-08-18 (×8): 1 mg via INTRAVENOUS
  Filled 2017-08-16 (×8): qty 1

## 2017-08-16 MED ORDER — OXYCODONE HCL ER 10 MG PO T12A
10.0000 mg | EXTENDED_RELEASE_TABLET | Freq: Once | ORAL | Status: AC
  Administered 2017-08-16: 10 mg via ORAL
  Filled 2017-08-16: qty 1

## 2017-08-16 MED ORDER — ACETAMINOPHEN 500 MG PO TABS
1000.0000 mg | ORAL_TABLET | Freq: Three times a day (TID) | ORAL | Status: AC
  Administered 2017-08-16 – 2017-08-18 (×6): 1000 mg via ORAL
  Filled 2017-08-16 (×6): qty 2

## 2017-08-16 MED ORDER — OXYCODONE HCL ER 20 MG PO T12A
20.0000 mg | EXTENDED_RELEASE_TABLET | Freq: Two times a day (BID) | ORAL | Status: DC
  Administered 2017-08-16 – 2017-08-17 (×3): 20 mg via ORAL
  Filled 2017-08-16 (×3): qty 1

## 2017-08-16 NOTE — Progress Notes (Signed)
PROGRESS NOTE [facility]   Terri [name]   KKX:[id number] DOB: [birth year]  DOA: 08/10/2017 PCP: [name], MD   Brief Narrative:  Terri [name] is a 42 year old female with medical history significant for hypertension, bipolar disorder and IV drug user presented  to the emergency department complaining of neck pain, swelling and erythema.  Upon ED evaluation CT scan showed neck cellulitis with no abscess.  Patient was admitted for working diagnosis of neck cellulitis, developed submental abscess and found to have osteomyelitis now status post I&D  Subjective: Patient seen and examined, she is comfortable.  Penrose drain still in place, pain well controlled  Assessment & Plan: Cellulitis of the neck/submandibular abscess and mandibular osteomyelitis Felt to be related to odontogenic infection Was treated with clindamycin, currently on Unasyn and vancomycin.  She is status post I&D of submandibular abscess.  Wound culture still pending, will make antibiotic decision pending on results.  ID following.  Will likely benefit from oral antibiotic given history of IV drug use  Pain management - much improved Need to start tapering Dilaudid, will change to 1 mg every 6 hours as needed will increase OxyContin to 20 mg every 12 hours.  Goal is to keep her on long-acting opioid and help her to  it connected to Suboxone clinic.  I discussed the case with internal medicine teaching service and they will be happy to see her as an outpatient.   Hypertension Blood pressure remains stable continue current regimen  Hypokalemia -resolved  Anxiety/depression Patient on Elavil and Cymbalta will continue   DVT prophylaxis: Lovenox Code Status: Full code Family Communication: None at bedside Disposition Plan: Pending wound cultures Home versus SNF  Consultants:   None  Procedures:   None  Antimicrobials: Anti-infectives (From admission, onward)   Start     Dose/Rate Route Frequency  Ordered Stop   08/12/17 0200  vancomycin (VANCOCIN) IVPB 1000 mg/200 mL premix     1,000 mg 200 mL/hr over 60 Minutes Intravenous Every 12 hours 08/11/17 1124     08/11/17 1130  vancomycin (VANCOCIN) 1,750 mg in sodium chloride 0.9 % 500 mL IVPB     1,750 mg 250 mL/hr over 120 Minutes Intravenous  Once 08/11/17 1045 08/11/17 1600   08/11/17 0600  Ampicillin-Sulbactam (UNASYN) 3 g in sodium chloride 0.9 % 100 mL IVPB     3 g 200 mL/hr over 30 Minutes Intravenous Every 6 hours 08/10/17 2257     08/10/17 2245  Ampicillin-Sulbactam (UNASYN) 3 g in sodium chloride 0.9 % 100 mL IVPB     3 g 200 mL/hr over 30 Minutes Intravenous  Once 08/10/17 2231 08/10/17 2352   08/10/17 1830  clindamycin (CLEOCIN) IVPB 600 mg     600 mg 100 mL/hr over 30 Minutes Intravenous  Once 08/10/17 1818 08/10/17 1929       Objective: Vitals:   08/15/17 1402 08/15/17 2034 08/16/17 0359 08/16/17 1458  BP: (!) 134/94 (!) 145/99 (!) 142/87 132/86  Pulse: 79 67 66 67  Resp: 18 18 14 14   Temp: 98.6 F (37 C) 98.4 F (36.9 C) 98 F (36.7 C) 98.3 F (36.8 C)  TempSrc: Oral Oral Oral Oral  SpO2: 100% 99% 99% 99%  Weight:      Height:        Intake/Output Summary (Last 24 hours) at 08/16/2017 1545 Last data filed at 08/16/2017 1446 Gross per 24 hour  Intake 800 ml  Output -  Net 800 ml   [employer]  08/10/17 1427  Weight: 88 kg (194 lb)    Examination:  General: Pt is alert, awake, not in acute distress HEENT: Penrose in place drain is minimal Cardiovascular: RRR, S1/S2 +, no rubs, no gallops Respiratory: CTA bilaterally, no wheezing, no rhonchi  Data Reviewed: I have personally reviewed following labs and imaging studies  CBC: Recent Labs  Lab 08/10/17 1848 08/11/17 0600 08/12/17 0607 08/13/17 0520 08/14/17 0532  WBC 26.5* 19.1* 16.9* 12.8* 15.9*  NEUTROABS 22.8*  --  12.8* 7.7  --   HGB 9.1* 8.3* 8.4* 8.3* 9.1*  HCT 28.2* 25.8* 26.5* 26.2* 29.2*  MCV 63.2* 63.5* 63.9* 64.1* 64.2*    PLT 313 293 342 364 301*   Basic Metabolic Panel: Recent Labs  Lab 08/10/17 1848 08/12/17 0607 08/13/17 0520 08/14/17 0532  NA 140 140 140 139  K 2.9* 3.0* 3.2* 4.4  CL 101 103 102 100*  CO2 29 28 29 29   GLUCOSE 105* 78 77 104*  BUN 9 7 10 11   CREATININE 0.64 0.47 0.54 0.55  CALCIUM 9.0 8.8* 8.8* 9.3  MG  --  1.9 1.8 1.8   GFR: Estimated Creatinine Clearance: 107.2 mL/min (by C-G formula based on SCr of 0.55 mg/dL). Liver Function Tests: Recent Labs  Lab 08/10/17 1848  AST 15  ALT 9*  ALKPHOS 120  BILITOT 0.6  PROT 6.3*  ALBUMIN 3.0*   No results for input(s): LIPASE, AMYLASE in the last 168 hours. No results for input(s): AMMONIA in the last 168 hours. Coagulation Profile: No results for input(s): INR, PROTIME in the last 168 hours. Cardiac Enzymes: No results for input(s): CKTOTAL, CKMB, CKMBINDEX, TROPONINI in the last 168 hours. BNP (last 3 results) No results for input(s): PROBNP in the last 8760 hours. HbA1C: No results for input(s): HGBA1C in the last 72 hours. CBG: No results for input(s): GLUCAP in the last 168 hours. Lipid Profile: No results for input(s): CHOL, HDL, LDLCALC, TRIG, CHOLHDL, LDLDIRECT in the last 72 hours. Thyroid Function Tests: No results for input(s): TSH, T4TOTAL, FREET4, T3FREE, THYROIDAB in the last 72 hours. Anemia Panel: No results for input(s): VITAMINB12, FOLATE, FERRITIN, TIBC, IRON, RETICCTPCT in the last 72 hours. Sepsis Labs: Recent Labs  Lab 08/10/17 1901  LATICACIDVEN 1.07    Recent Results (from the past 240 hour(s))  Blood Culture (routine x 2)     Status: None   Collection Time: 08/10/17  6:33 PM  Result Value Ref Range Status   Specimen Description   Final    BLOOD RIGHT ANTECUBITAL Performed at [facility], [place] [address], [place], [place] [address]    Special Requests   Final    BOTTLES DRAWN AEROBIC AND ANAEROBIC Blood Culture adequate volume Performed at [facility] [address], [place], [place] [address]    Culture   Final    NO GROWTH 5 DAYS Performed at [facility], [place] [address], [place], [place] [address]    Report Status 08/15/2017 FINAL  Final  Blood Culture (routine x 2)     Status: None   Collection Time: 08/11/17  6:00 AM  Result Value Ref Range Status   Specimen Description   Final    BLOOD LEFT HAND Performed at [facility] [address] [place], [place] [address]    Special Requests   Final    BOTTLES DRAWN AEROBIC AND ANAEROBIC Blood Culture adequate volume Performed at [facility] [place]  [place], [place] [address]    Culture   Final    NO GROWTH 5 DAYS Performed at [facility], [place] [address] [place], [place] [address]    Report Status 08/16/2017 FINAL  Final  Surgical PCR screen     Status: None   Collection Time: 08/12/17  8:21 PM  Result Value Ref Range Status   MRSA, PCR NEGATIVE NEGATIVE Final   Staphylococcus aureus NEGATIVE NEGATIVE Final    Comment: (NOTE) The Xpert SA Assay (FDA approved for NASAL specimens in patients 4 years of age and older), is one component of a comprehensive surveillance program. It is not intended to diagnose infection nor to guide or monitor treatment. Performed at [facility], [place] [address], [place], [place] [address]   Aerobic/Anaerobic Culture (surgical/deep wound)     Status: None (Preliminary result)   Collection Time: 08/13/17  9:24 AM  Result Value Ref Range Status   Specimen Description   Final    ABSCESS LEFT NECK Performed at [facility] [address], [place], [place] [address]    Special Requests   Final    PATIENT ON FOLLOWING VANCOMYCIN Performed at [facility], [place] [address], [place], [place] [address]    Gram Stain   Final    MODERATE WBC PRESENT, PREDOMINANTLY PMN ABUNDANT GRAM POSITIVE COCCI IN PAIRS Performed at  [facility], [place] [address] [place], [place] [address]    Culture   Final    CULTURE REINCUBATED FOR BETTER GROWTH NO ANAEROBES ISOLATED; CULTURE IN PROGRESS FOR 5 DAYS    Report Status PENDING  Incomplete      Radiology Studies: No results found.    Scheduled Meds: . amitriptyline  75 mg Oral QHS  . amLODipine  10 mg Oral Daily  . docusate sodium  100 mg Oral BID  . DULoxetine  60 mg Oral Daily  . enoxaparin (LOVENOX) injection  40 mg Subcutaneous Daily  . lisinopril  10 mg Oral Daily   And  . hydrochlorothiazide  12.5 mg Oral Daily  . nicotine  21 mg Transdermal Daily  . oxyCODONE  20 mg Oral Q12H  . pantoprazole  40 mg Oral BID  . polyethylene glycol  17 g Oral Daily   Continuous Infusions: . ampicillin-sulbactam (UNASYN) IV 3 g (08/16/17 1516)  . vancomycin Stopped (08/16/17 1446)     LOS: 4 days    Time spent: Total of 15 minutes spent with pt, greater than 50% of which was spent in discussion of  treatment, counseling and coordination of care  Chipper Oman, MD Pager: Text Page via [email or web address]   If 7PM-7AM, please contact night-coverage [email or web address] 08/16/2017, 3:45 PM   Note - This record has been created using [employer]. Chart creation errors have been sought, but may not always have been located. Such creation errors do not reflect on the standard of medical care.

## 2017-08-16 NOTE — Progress Notes (Signed)
INFECTIOUS DISEASE PROGRESS NOTE  ID: Terri [name] is a 42 y.o. female with  Principal Problem:   Cellulitis of submandibular region Active Problems:   Opioid abuse with opioid-induced mood disorder ([place])   Odontogenic infection of jaw   Cellulitis and abscess of neck  Subjective: No complaints. Continued pain.   Abtx:  Anti-infectives (From admission, onward)   Start     Dose/Rate Route Frequency Ordered Stop   08/12/17 0200  vancomycin (VANCOCIN) IVPB 1000 mg/200 mL premix     1,000 mg 200 mL/hr over 60 Minutes Intravenous Every 12 hours 08/11/17 1124     08/11/17 1130  vancomycin (VANCOCIN) 1,750 mg in sodium chloride 0.9 % 500 mL IVPB     1,750 mg 250 mL/hr over 120 Minutes Intravenous  Once 08/11/17 1045 08/11/17 1600   08/11/17 0600  Ampicillin-Sulbactam (UNASYN) 3 g in sodium chloride 0.9 % 100 mL IVPB     3 g 200 mL/hr over 30 Minutes Intravenous Every 6 hours 08/10/17 2257     08/10/17 2245  Ampicillin-Sulbactam (UNASYN) 3 g in sodium chloride 0.9 % 100 mL IVPB     3 g 200 mL/hr over 30 Minutes Intravenous  Once 08/10/17 2231 08/10/17 2352   08/10/17 1830  clindamycin (CLEOCIN) IVPB 600 mg     600 mg 100 mL/hr over 30 Minutes Intravenous  Once 08/10/17 1818 08/10/17 1929      Medications:  Scheduled: . amitriptyline  75 mg Oral QHS  . amLODipine  10 mg Oral Daily  . docusate sodium  100 mg Oral BID  . DULoxetine  60 mg Oral Daily  . enoxaparin (LOVENOX) injection  40 mg Subcutaneous Daily  . lisinopril  10 mg Oral Daily   And  . hydrochlorothiazide  12.5 mg Oral Daily  . nicotine  21 mg Transdermal Daily  . oxyCODONE  10 mg Oral Q12H  . pantoprazole  40 mg Oral BID  . polyethylene glycol  17 g Oral Daily    Objective: Vital signs in last 24 hours: Temp:  [98 F (36.7 C)-98.6 F (37 C)] 98 F (36.7 C) (03/26 0359) Pulse Rate:  [66-79] 66 (03/26 0359) Resp:  [14-18] 14 (03/26 0359) BP: (122-145)/(72-99) 142/87 (03/26 0359) SpO2:  [99 %-100 %] 99  % (03/26 0359)   General appearance: alert, cooperative and no distress Throat: no lesions, few teeth.  Neck: drain in place, no d/c, +induration.  Resp: clear to auscultation bilaterally Cardio: regular rate and rhythm GI: normal findings: bowel sounds normal and soft, non-tender  Lab Results Recent Labs    08/14/17 0532  WBC 15.9*  HGB 9.1*  HCT 29.2*  NA 139  K 4.4  CL 100*  CO2 29  BUN 11  CREATININE 0.55   Liver Panel No results for input(s): PROT, ALBUMIN, AST, ALT, ALKPHOS, BILITOT, BILIDIR, IBILI in the last 72 hours. Sedimentation Rate No results for input(s): ESRSEDRATE in the last 72 hours. C-Reactive Protein No results for input(s): CRP in the last 72 hours.  Microbiology: Recent Results (from the past 240 hour(s))  Blood Culture (routine x 2)     Status: None   Collection Time: 08/10/17  6:33 PM  Result Value Ref Range Status   Specimen Description   Final    BLOOD RIGHT ANTECUBITAL Performed at [facility] [address], [place], [place] [address]    Special Requests   Final    BOTTLES DRAWN AEROBIC AND ANAEROBIC Blood Culture adequate volume  Performed at [facility], [place] [address], [place], [place] [address]    Culture   Final    NO GROWTH 5 DAYS Performed at [facility], [place] [address] [place], [place] [address]    Report Status 08/15/2017 FINAL  Final  Blood Culture (routine x 2)     Status: None (Preliminary result)   Collection Time: 08/11/17  6:00 AM  Result Value Ref Range Status   Specimen Description   Final    BLOOD LEFT HAND Performed at [facility] [address], [place], [place] [address]    Special Requests   Final    BOTTLES DRAWN AEROBIC AND ANAEROBIC Blood Culture adequate volume Performed at [facility] [address], [place], [place] [address]    Culture   Final    NO GROWTH 4 DAYS Performed at [facility], [place]  [address], [place], [place] [address]    Report Status PENDING  Incomplete  Surgical PCR screen     Status: None   Collection Time: 08/12/17  8:21 PM  Result Value Ref Range Status   MRSA, PCR NEGATIVE NEGATIVE Final   Staphylococcus aureus NEGATIVE NEGATIVE Final    Comment: (NOTE) The Xpert SA Assay (FDA approved for NASAL specimens in patients 66 years of age and older), is one component of a comprehensive surveillance program. It is not intended to diagnose infection nor to guide or monitor treatment. Performed at [facility], [place] [address] [place], [place] [address]   Aerobic/Anaerobic Culture (surgical/deep wound)     Status: None (Preliminary result)   Collection Time: 08/13/17  9:24 AM  Result Value Ref Range Status   Specimen Description   Final    ABSCESS LEFT NECK Performed at [facility] [address], [place], [place] [address]    Special Requests   Final    PATIENT ON FOLLOWING VANCOMYCIN Performed at [facility], [place] [address], [place], [place] [address]    Gram Stain   Final    MODERATE WBC PRESENT, PREDOMINANTLY PMN ABUNDANT GRAM POSITIVE COCCI IN PAIRS Performed at [facility], [place] [address] [place], [place] [address]    Culture   Final    CULTURE REINCUBATED FOR BETTER GROWTH NO ANAEROBES ISOLATED; CULTURE IN PROGRESS FOR 5 DAYS    Report Status PENDING  Incomplete    Studies/Results: No results found.   Assessment/Plan: Dental space infection, osteo of jaw Opioid Use disorder  Total days of antibiotics: 4 (vanco/unasyn)  Cx showing abundant GPC in pairs. Cx still pending.  Await further ID of Cx before making final anbx recc's.  Repeat HIV (-), Hep C (-).  Appreciate ENT f/u.   I spoke with her about her opioid use- she has not used in several months. She denies being on suboxonne prior. When asked if she would like to be started on rx,, she tells me emphatically "the doctor  says that I need to be". I asked her what she thinks and she finally agrees that she would like to start. This can be arranged as outpt, she is currently on opioids for pain.           Bobby Rumpf MD, FACP Infectious Diseases (pager) [phone] [email or web address] 08/16/2017, 8:45 AM  LOS: 4 days

## 2017-08-16 NOTE — Progress Notes (Signed)
   Subjective:    Patient ID: Terri [name], female    DOB: Dec 14, 1975, 42 y.o.   MRN: [id number]  HPI A bit more soreness today.  Review of Systems     Objective:   Physical Exam AF VSS Neck wound with Penrose drain in place.  Continues to drain pus.    Assessment & Plan:  Submental abscess s/p I&D, mandibular osteomyelitis  Culture pending.  Will determine long-term antibiotic therapy.  Will remove drain when drainage decreases.

## 2017-08-17 DIAGNOSIS — I10 Essential (primary) hypertension: Secondary | ICD-10-CM

## 2017-08-17 DIAGNOSIS — F1114 Opioid abuse with opioid-induced mood disorder: Secondary | ICD-10-CM

## 2017-08-17 LAB — CBC
HEMATOCRIT: 34.3 % — AB (ref 36.0–46.0)
HEMOGLOBIN: 10.6 g/dL — AB (ref 12.0–15.0)
MCH: 20 pg — ABNORMAL LOW (ref 26.0–34.0)
MCHC: 30.9 g/dL (ref 30.0–36.0)
MCV: 64.8 fL — ABNORMAL LOW (ref 78.0–100.0)
Platelets: 508 10*3/uL — ABNORMAL HIGH (ref 150–400)
RBC: 5.29 MIL/uL — AB (ref 3.87–5.11)
RDW: 20.5 % — ABNORMAL HIGH (ref 11.5–15.5)
WBC: 9.9 10*3/uL (ref 4.0–10.5)

## 2017-08-17 MED ORDER — IBUPROFEN 200 MG PO TABS: 600.0000 mg | ORAL_TABLET | Freq: Four times a day (QID) | ORAL | Status: DC | PRN

## 2017-08-17 MED ORDER — BISACODYL 10 MG RE SUPP
10.0000 mg | Freq: Every day | RECTAL | Status: DC | PRN
  Administered 2017-08-17: 10 mg via RECTAL
  Filled 2017-08-17: qty 1

## 2017-08-17 MED ORDER — FLUCONAZOLE 150 MG PO TABS
150.0000 mg | ORAL_TABLET | Freq: Once | ORAL | Status: AC
  Administered 2017-08-17: 150 mg via ORAL
  Filled 2017-08-17: qty 1

## 2017-08-17 NOTE — Progress Notes (Signed)
   Subjective:    Patient ID: Terri [name], female    DOB: 14-Oct-1975, 42 y.o.   MRN: [id number]  HPI Complains of worse pain but pain medicine being weaned.  Review of Systems     Objective:   Physical Exam AF VSS Alert, NAD Exposed area of left mandible and tooth sockets on left without frank purulent drainage. Neck wound with Penrose drain in place, minor purulent drainage, swelling much improved.    Assessment & Plan:  Submental abscess and mandibular osteomyelitis s/p I&D  Culture remains pending.  Certainly, there is clinical improvement.  Will likely remove Penrose drain tomorrow.

## 2017-08-17 NOTE — Progress Notes (Signed)
PROGRESS NOTE    Terri [name]  QQI:[id number] DOB: 06/23/1975 DOA: 08/10/2017 PCP: [name], MD   Brief Narrative: 42 year old female with history of hypertension, bipolar mood disorder, IV drug abuse presented to the ER with neck pain, swelling and erythema.  CT scan with cellulitis of neck with no abscess.  Admitted for further evaluation.  Patient was evaluated by ID and ENT.  Patient is a status post I&D of submandibular abscess.  Currently on IV antibiotics.  Assessment & Plan:   #Cellulitis of submandibular region/abscess and osteomyelitis: Felt to be related with odontogenic infection.  Status post I&D of submandibular abscess.  Currently has dressing applied.  Continue IV Unasyn and vancomycin.  Follow-up final culture results to decide antibiotics.  ID and ENT consult appreciated. -Follow-up CBC.  #Pain management: During morning around patient was angry, uncooperative and yelling at staff's including this provider.  Tried to provide education however patient not willing to listen.  Patient's fianc at bedside she was threatening to leave hospital.  Patient did not look in distress.  She is currently receiving OxyContin 20 mg twice a day and Dilaudid IV as needed.  I was trying to discuss with the patient the importance of tapering down narcotics.  She did not want to try Toradol.  Added ibuprofen oral.  Refused muscle relaxant or Robaxin.  Patient will benefit from a Suboxone clinic after discharge.  #IV drug abuse and tobacco abuse: Patient was very uncooperative to lessen any education.  Currently on nicotine.  Try to taper down the narcotics.  #Hypertension: Monitor blood pressure.  On Norvasc, lisinopril and hydrochlorothiazide  #Continue other home medication including amitriptyline, Cymbalta, stool softener. I think patient has capacity to make decision.  DVT prophylaxis: Lovenox subcutaneous Code Status: Full code Family Communication: Patient's fianc at  bedside Disposition Plan: Likely discharge home in 1-2 days    Consultants:   ENT  ID  Procedures: Mandibular abscess I&D Antimicrobials: Vancomycin and Unasyn  Subjective: Seen and examined at bedside.  Patient started yelling and being uncooperative.  Reported pain and not willing to try oral medication.  Review of systems limited hyper denies chest pain or shortness of breath.  Objective: Vitals:   08/16/17 0359 08/16/17 1458 08/16/17 2039 08/17/17 0520  BP: (!) 142/87 132/86 122/85 (!) 142/83  Pulse: 66 67 84 88  Resp: 14 14 15 18   Temp: 98 F (36.7 C) 98.3 F (36.8 C) 98.3 F (36.8 C) 97.8 F (36.6 C)  TempSrc: Oral Oral Oral Oral  SpO2: 99% 99% 99% 95%  Weight:      Height:        Intake/Output Summary (Last 24 hours) at 08/17/2017 1049 Last data filed at 08/17/2017 0843 Gross per 24 hour  Intake 940 ml  Output -  Net 940 ml   Filed Weights   08/10/17 1427  Weight: 88 kg (194 lb)    Examination:  General exam: Sitting on bed, angry with his staff's Mandible with a gauze in place, no discharge noticed. Respiratory system: Clear to auscultation. Respiratory effort normal. No wheezing or crackle Cardiovascular system: S1 & S2 heard, RRR.  No pedal edema. Gastrointestinal system: Abdomen is nondistended, soft and nontender. Normal bowel sounds heard. Central nervous system: Alert awake Extremities: Symmetric 5 x 5 power. Skin: No rashes, lesions or ulcers   Data Reviewed: I have personally reviewed following labs and imaging studies  CBC: Recent Labs  Lab 08/10/17 1848 08/11/17 0600 08/12/17 9417 08/13/17 0520 08/14/17 0532  WBC 26.5* 19.1* 16.9* 12.8* 15.9*  NEUTROABS 22.8*  --  12.8* 7.7  --   HGB 9.1* 8.3* 8.4* 8.3* 9.1*  HCT 28.2* 25.8* 26.5* 26.2* 29.2*  MCV 63.2* 63.5* 63.9* 64.1* 64.2*  PLT 313 293 342 364 678*   Basic Metabolic Panel: Recent Labs  Lab 08/10/17 1848 08/12/17 0607 08/13/17 0520 08/14/17 0532  NA 140 140 140 139   K 2.9* 3.0* 3.2* 4.4  CL 101 103 102 100*  CO2 29 28 29 29   GLUCOSE 105* 78 77 104*  BUN 9 7 10 11   CREATININE 0.64 0.47 0.54 0.55  CALCIUM 9.0 8.8* 8.8* 9.3  MG  --  1.9 1.8 1.8   GFR: Estimated Creatinine Clearance: 107.2 mL/min (by C-G formula based on SCr of 0.55 mg/dL). Liver Function Tests: Recent Labs  Lab 08/10/17 1848  AST 15  ALT 9*  ALKPHOS 120  BILITOT 0.6  PROT 6.3*  ALBUMIN 3.0*   No results for input(s): LIPASE, AMYLASE in the last 168 hours. No results for input(s): AMMONIA in the last 168 hours. Coagulation Profile: No results for input(s): INR, PROTIME in the last 168 hours. Cardiac Enzymes: No results for input(s): CKTOTAL, CKMB, CKMBINDEX, TROPONINI in the last 168 hours. BNP (last 3 results) No results for input(s): PROBNP in the last 8760 hours. HbA1C: No results for input(s): HGBA1C in the last 72 hours. CBG: No results for input(s): GLUCAP in the last 168 hours. Lipid Profile: No results for input(s): CHOL, HDL, LDLCALC, TRIG, CHOLHDL, LDLDIRECT in the last 72 hours. Thyroid Function Tests: No results for input(s): TSH, T4TOTAL, FREET4, T3FREE, THYROIDAB in the last 72 hours. Anemia Panel: No results for input(s): VITAMINB12, FOLATE, FERRITIN, TIBC, IRON, RETICCTPCT in the last 72 hours. Sepsis Labs: Recent Labs  Lab 08/10/17 1901  LATICACIDVEN 1.07    Recent Results (from the past 240 hour(s))  Blood Culture (routine x 2)     Status: None   Collection Time: 08/10/17  6:33 PM  Result Value Ref Range Status   Specimen Description   Final    BLOOD RIGHT ANTECUBITAL Performed at [facility], [place] [address] [place] [address] [place], [place] [address]    Special Requests   Final    BOTTLES DRAWN AEROBIC AND ANAEROBIC Blood Culture adequate volume Performed at [facility] [address] [place], [place] [address]    Culture   Final    NO GROWTH 5 DAYS Performed at [facility], [place] [address]  [place], [place] [address]    Report Status 08/15/2017 FINAL  Final  Blood Culture (routine x 2)     Status: None   Collection Time: 08/11/17  6:00 AM  Result Value Ref Range Status   Specimen Description   Final    BLOOD LEFT HAND Performed at [facility] [address], [place], [place] [address]    Special Requests   Final    BOTTLES DRAWN AEROBIC AND ANAEROBIC Blood Culture adequate volume Performed at [facility] [address] [place], [place] [address]    Culture   Final    NO GROWTH 5 DAYS Performed at [facility], [place] [address] [place], [place] [address]    Report Status 08/16/2017 FINAL  Final  Surgical PCR screen     Status: None   Collection Time: 08/12/17  8:21 PM  Result Value Ref Range Status   MRSA, PCR NEGATIVE NEGATIVE Final   Staphylococcus aureus  NEGATIVE NEGATIVE Final    Comment: (NOTE) The Xpert SA Assay (FDA approved for NASAL specimens in patients 78 years of age and older), is one component of a comprehensive surveillance program. It is not intended to diagnose infection nor to guide or monitor treatment. Performed at [facility], [place] [address], [place], [place] [address]   Aerobic/Anaerobic Culture (surgical/deep wound)     Status: None (Preliminary result)   Collection Time: 08/13/17  9:24 AM  Result Value Ref Range Status   Specimen Description   Final    ABSCESS LEFT NECK Performed at [facility] [address], [place], [place] [address]    Special Requests   Final    PATIENT ON FOLLOWING VANCOMYCIN Performed at [facility], [place] [address], [place], [place] [address]    Gram Stain   Final    MODERATE WBC PRESENT, PREDOMINANTLY PMN ABUNDANT GRAM POSITIVE COCCI IN PAIRS Performed at [facility], [place] [address] [place], [place] [address]    Culture   Final    CULTURE REINCUBATED FOR BETTER GROWTH NO ANAEROBES ISOLATED;  CULTURE IN PROGRESS FOR 5 DAYS    Report Status PENDING  Incomplete         Radiology Studies: No results found.      Scheduled Meds: . acetaminophen  1,000 mg Oral TID  . amitriptyline  75 mg Oral QHS  . amLODipine  10 mg Oral Daily  . docusate sodium  100 mg Oral BID  . DULoxetine  60 mg Oral Daily  . enoxaparin (LOVENOX) injection  40 mg Subcutaneous Daily  . lisinopril  10 mg Oral Daily   And  . hydrochlorothiazide  12.5 mg Oral Daily  . nicotine  21 mg Transdermal Daily  . oxyCODONE  20 mg Oral Q12H  . pantoprazole  40 mg Oral BID  . polyethylene glycol  17 g Oral Daily   Continuous Infusions: . ampicillin-sulbactam (UNASYN) IV 3 g (08/17/17 0930)  . vancomycin Stopped (08/17/17 0244)     LOS: 5 days    Kesa Birky Tanna Furry, MD [facility] Pager [phone]  If 7PM-7AM, please contact night-coverage [email or web address] Password [facility] 08/17/2017, 10:49 AM

## 2017-08-17 NOTE — Progress Notes (Addendum)
Pharmacy Antibiotic Note  Terri [name] is a 42 y.o. female admitted on 08/10/2017 with odontogenic infection.  Pharmacy has been consulted for Unasyn and vancomycin dosing.   08/17/2017:  D7 antibiotics  Afebrile  WBC WNL  Abscess cx still pending  Renal function stable at patient's baseline, CrCl>167ml/min  Plan:  Continue Vancomycin 1g IV q12h  Check levels at steady state, goal AUC 400-500  Continue Unasyn 3g IV q6h  Follow up renal function & cultures, clinical course  Anticipate discharge home on oral abx in next 24h so will defer checking level for now  Height: 5\' 9"  (175.3 cm) Weight: 194 lb (88 kg) IBW/kg (Calculated) : 66.2  Temp (24hrs), Avg:98.1 F (36.7 C), Min:97.8 F (36.6 C), Max:98.3 F (36.8 C)  Recent Labs  Lab 08/10/17 1848 08/10/17 1901 08/11/17 0600 08/12/17 0607 08/13/17 0520 08/14/17 0532  WBC 26.5*  --  19.1* 16.9* 12.8* 15.9*  CREATININE 0.64  --   --  0.47 0.54 0.55  LATICACIDVEN  --  1.07  --   --   --   --     Estimated Creatinine Clearance: 107.2 mL/min (by C-G formula based on SCr of 0.55 mg/dL).    Allergies  Allergen Reactions  . Ketorolac Tromethamine Itching and Nausea And Vomiting  . Methocarbamol Diarrhea    Antimicrobials this admission: 3/20 Clinda x 1 3/20 Unasyn >>  3/21 Vancomycin >>  Dose adjustments this admission:  Microbiology results: 3/21 BCx: NG-F 3/22 MRSA surgical PCR: negative 3/23 Abscess culture: abundant Gr+ cocci in pairs-reincubated  Thank you for allowing pharmacy to be a part of this patient's care.  Netta Cedars, PharmD, BCPS Pager: [phone] 08/17/2017 10:25 AM

## 2017-08-17 NOTE — Progress Notes (Signed)
Patient refused lab today; tried 2 times and still refused. Paged MD at the same time.

## 2017-08-17 NOTE — Progress Notes (Addendum)
INFECTIOUS DISEASE PROGRESS NOTE  ID: Terri [name] is a 42 y.o. female with  Principal Problem:   Cellulitis of submandibular region Active Problems:   Opioid abuse with opioid-induced mood disorder ([place])   Odontogenic infection of jaw   Cellulitis and abscess of neck   Essential hypertension  Subjective: Cont mandibular pain.  Denies dysphagia.  +pain with chewing on L mandible.   Abtx:  Anti-infectives (From admission, onward)   Start     Dose/Rate Route Frequency Ordered Stop   08/12/17 0200  vancomycin (VANCOCIN) IVPB 1000 mg/200 mL premix     1,000 mg 200 mL/hr over 60 Minutes Intravenous Every 12 hours 08/11/17 1124     08/11/17 1130  vancomycin (VANCOCIN) 1,750 mg in sodium chloride 0.9 % 500 mL IVPB     1,750 mg 250 mL/hr over 120 Minutes Intravenous  Once 08/11/17 1045 08/11/17 1600   08/11/17 0600  Ampicillin-Sulbactam (UNASYN) 3 g in sodium chloride 0.9 % 100 mL IVPB     3 g 200 mL/hr over 30 Minutes Intravenous Every 6 hours 08/10/17 2257     08/10/17 2245  Ampicillin-Sulbactam (UNASYN) 3 g in sodium chloride 0.9 % 100 mL IVPB     3 g 200 mL/hr over 30 Minutes Intravenous  Once 08/10/17 2231 08/10/17 2352   08/10/17 1830  clindamycin (CLEOCIN) IVPB 600 mg     600 mg 100 mL/hr over 30 Minutes Intravenous  Once 08/10/17 1818 08/10/17 1929      Medications:  Scheduled: . acetaminophen  1,000 mg Oral TID  . amitriptyline  75 mg Oral QHS  . amLODipine  10 mg Oral Daily  . docusate sodium  100 mg Oral BID  . DULoxetine  60 mg Oral Daily  . enoxaparin (LOVENOX) injection  40 mg Subcutaneous Daily  . lisinopril  10 mg Oral Daily   And  . hydrochlorothiazide  12.5 mg Oral Daily  . nicotine  21 mg Transdermal Daily  . oxyCODONE  20 mg Oral Q12H  . pantoprazole  40 mg Oral BID  . polyethylene glycol  17 g Oral Daily    Objective: Vital signs in last 24 hours: Temp:  [97.8 F (36.6 C)-98.7 F (37.1 C)] 98.7 F (37.1 C) (03/27 1414) Pulse Rate:   [84-104] 104 (03/27 1414) Resp:  [15-22] 22 (03/27 1414) BP: (122-149)/(83-113) 138/98 (03/27 1421) SpO2:  [95 %-99 %] 98 % (03/27 1414)   General appearance: alert, cooperative and no distress Neck: indurated area along mandible, medially and distally to drain. no d/c.  Resp: clear to auscultation bilaterally Cardio: regular rate and rhythm GI: normal findings: bowel sounds normal and soft, non-tender  Lab Results Recent Labs    08/17/17 1015  WBC 9.9  HGB 10.6*  HCT 34.3*   Liver Panel No results for input(s): PROT, ALBUMIN, AST, ALT, ALKPHOS, BILITOT, BILIDIR, IBILI in the last 72 hours. Sedimentation Rate No results for input(s): ESRSEDRATE in the last 72 hours. C-Reactive Protein No results for input(s): CRP in the last 72 hours.  Microbiology: Recent Results (from the past 240 hour(s))  Blood Culture (routine x 2)     Status: None   Collection Time: 08/10/17  6:33 PM  Result Value Ref Range Status   Specimen Description   Final    BLOOD RIGHT ANTECUBITAL Performed at [facility] [address] [place], [place] [address]    Special Requests   Final    BOTTLES DRAWN AEROBIC AND ANAEROBIC Blood Culture  adequate volume Performed at [facility] [address] [place], [place] [address]    Culture   Final    NO GROWTH 5 DAYS Performed at [facility], [place] [address] [place], [place] [address]    Report Status 08/15/2017 FINAL  Final  Blood Culture (routine x 2)     Status: None   Collection Time: 08/11/17  6:00 AM  Result Value Ref Range Status   Specimen Description   Final    BLOOD LEFT HAND Performed at [facility] [address] [place], [place] [address]    Special Requests   Final    BOTTLES DRAWN AEROBIC AND ANAEROBIC Blood Culture adequate volume Performed at [facility] [address], [place], [place] [address]    Culture   Final    NO GROWTH 5  DAYS Performed at [facility], [place] [address] [place], [place] [address]    Report Status 08/16/2017 FINAL  Final  Surgical PCR screen     Status: None   Collection Time: 08/12/17  8:21 PM  Result Value Ref Range Status   MRSA, PCR NEGATIVE NEGATIVE Final   Staphylococcus aureus NEGATIVE NEGATIVE Final    Comment: (NOTE) The Xpert SA Assay (FDA approved for NASAL specimens in patients 67 years of age and older), is one component of a comprehensive surveillance program. It is not intended to diagnose infection nor to guide or monitor treatment. Performed at [facility], [place] [address] [place], [place] [address]   Aerobic/Anaerobic Culture (surgical/deep wound)     Status: None (Preliminary result)   Collection Time: 08/13/17  9:24 AM  Result Value Ref Range Status   Specimen Description   Final    ABSCESS LEFT NECK Performed at [place] [address], [place], [place] [address]    Special Requests   Final    PATIENT ON FOLLOWING VANCOMYCIN Performed at [facility], [place] [address], [place], [place] [address]    Gram Stain   Final    MODERATE WBC PRESENT, PREDOMINANTLY PMN ABUNDANT GRAM POSITIVE COCCI IN PAIRS Performed at [facility], [place] [address], [place], [place] [address]    Culture   Final    MODERATE NORMAL SKIN FLORA NO ANAEROBES ISOLATED; CULTURE IN PROGRESS FOR 5 DAYS    Report Status PENDING  Incomplete    Studies/Results: No results found.   Assessment/Plan: Dental space infection, osteo of jaw Opioid Use disorder  Total days of antibiotics:5 (vanco/unasyn)  Will stop vanco Plan to transition her to oral augmentin at time of d/c, aim for 3 weeks total.  Try to get her into suboxone clinic asap at d/c Available as needed.           Bobby Rumpf MD, FACP Infectious Diseases (pager) [phone] [email or web address] 08/17/2017, 4:17 PM  LOS: 5 days

## 2017-08-18 LAB — AEROBIC/ANAEROBIC CULTURE W GRAM STAIN (SURGICAL/DEEP WOUND): Culture: NORMAL

## 2017-08-18 LAB — AEROBIC/ANAEROBIC CULTURE (SURGICAL/DEEP WOUND)

## 2017-08-18 MED ORDER — TIZANIDINE HCL 4 MG PO TABS
4.0000 mg | ORAL_TABLET | Freq: Three times a day (TID) | ORAL | Status: DC
  Administered 2017-08-18 – 2017-08-19 (×4): 4 mg via ORAL
  Filled 2017-08-18 (×4): qty 1

## 2017-08-18 MED ORDER — HYDROMORPHONE HCL 1 MG/ML IJ SOLN
0.5000 mg | Freq: Four times a day (QID) | INTRAMUSCULAR | Status: DC | PRN
  Administered 2017-08-18 – 2017-08-19 (×4): 0.5 mg via INTRAVENOUS
  Filled 2017-08-18 (×4): qty 0.5

## 2017-08-18 MED ORDER — IBUPROFEN 200 MG PO TABS
600.0000 mg | ORAL_TABLET | Freq: Four times a day (QID) | ORAL | Status: DC
  Administered 2017-08-18 – 2017-08-19 (×3): 600 mg via ORAL
  Filled 2017-08-18 (×3): qty 3

## 2017-08-18 MED ORDER — LACTULOSE 10 GM/15ML PO SOLN
30.0000 g | Freq: Four times a day (QID) | ORAL | Status: AC
  Administered 2017-08-18 (×2): 30 g via ORAL
  Filled 2017-08-18 (×3): qty 45

## 2017-08-18 MED ORDER — OXYCODONE HCL ER 10 MG PO T12A
10.0000 mg | EXTENDED_RELEASE_TABLET | Freq: Two times a day (BID) | ORAL | Status: DC
  Administered 2017-08-18 – 2017-08-19 (×3): 10 mg via ORAL
  Filled 2017-08-18 (×3): qty 1

## 2017-08-18 NOTE — Progress Notes (Signed)
CM consult for Suboxone appointment. This is not done by CM. LCSW consult placed. Marney Doctor RN,BSN,NCM [phone]

## 2017-08-18 NOTE — Progress Notes (Signed)
   Subjective:    Patient ID: Terri [name], female    DOB: [birth year], 42 y.o.   MRN: [id number]  HPI Some improvement in pain.  Still some drainage.  Review of Systems     Objective:   Physical Exam AF VSS Submental neck with mild tenderness.  Trace purulent drainage.  Removed Penrose drain.    Assessment & Plan:  Submental abscess and mandibular osteomyelitis s/p I&D.  Removed Penrose drain.  She will milk wound a few times per day until drainage stops.  Antibiotic course per ID.  Discharge when medically stable.

## 2017-08-18 NOTE — Progress Notes (Signed)
PROGRESS NOTE    Terri [name]  KDX:[id number] DOB: 1975-11-20 DOA: 08/10/2017 PCP: [name], MD   Brief Narrative: 42 year old female with history of hypertension, bipolar mood disorder, IV drug abuse presented to the ER with neck pain, swelling and erythema.  CT scan with cellulitis of neck with no abscess.  Admitted for further evaluation.  Patient was evaluated by ID and ENT.  Patient is a status post I&D of submandibular abscess.  Currently on IV antibiotics.  Assessment & Plan:   #Cellulitis of submandibular region/abscess and osteomyelitis: Felt to be related with odontogenic infection.  Status post I&D of submandibular abscess.  Plan to remove drained by ENT today.  Currently on IV Unasyn.  ID recommended to change to Augmentin for 3 weeks on discharge.  Continue supportive care.  Leukocytosis improved.  ID and ENT consult appreciated.    #Pain management: Patient was calm and comfortable today.  I discussed the importance of weaning down narcotics and unable to provide any prescription discharge.  Instead I ordered ibuprofen, Zanaflex.  I will lower the dose of oxycodone to 10 mg.  I will also lower the dose of Dilaudid 0.5 as needed which patient was very hesitant to.  I consulted case manager for the need of Suboxone clinic on discharge.  This is discussed with the patient as well.  She verbalized understanding.  Agreed with the plan of care.  #IV drug abuse and tobacco abuse:  education provided regarding Suboxone clinic. currently on nicotine.  Try to taper down the narcotics.  #Hypertension: Monitor blood pressure.  On Norvasc, lisinopril and hydrochlorothiazide  #Continue other home medication including amitriptyline, Cymbalta, stool softener. I think patient has capacity to make decision.  DVT prophylaxis: Lovenox subcutaneous Code Status: Full code Family Communication: No family at bedside. Disposition Plan: Likely discharge home in 1-2 days    Consultants:    ENT  ID  Procedures: Mandibular abscess I&D Antimicrobials: Unasyn  Subjective: Seen and examined at bedside.  Patient was calm and cooperative today.  Pain is controlled.  Denied nausea vomiting chest pain shortness of breath. Objective: Vitals:   08/17/17 1414 08/17/17 1421 08/17/17 2038 08/18/17 0542  BP: (!) 149/113 (!) 138/98 121/82 137/87  Pulse: (!) 104  90 89  Resp: (!) 22  20 18   Temp: 98.7 F (37.1 C)  98.5 F (36.9 C) 98.7 F (37.1 C)  TempSrc: Oral  Oral Oral  SpO2: 98%  100% 100%  Weight:      Height:        Intake/Output Summary (Last 24 hours) at 08/18/2017 1413 Last data filed at 08/18/2017 9767 Gross per 24 hour  Intake 840 ml  Output -  Net 840 ml   Filed Weights   08/10/17 1427  Weight: 88 kg (194 lb)    Examination:  General exam: Sitting on bed, not in distress Mandible with a gauze in place, no discharge noticed Respiratory system: Clear bilateral, respiratory effort normal.  No wheezing or crackle. Cardiovascular system: Regular rate rhythm, S1-S2 normal.  No pedal edema. Gastrointestinal system: Abdomen is nondistended, soft and nontender. Normal bowel sounds heard. Central nervous system: Alert awake and following commands.   Extremities: Symmetric 5 x 5 power.  Data Reviewed: I have personally reviewed following labs and imaging studies  CBC: Recent Labs  Lab 08/12/17 0607 08/13/17 0520 08/14/17 0532 08/17/17 1015  WBC 16.9* 12.8* 15.9* 9.9  NEUTROABS 12.8* 7.7  --   --   HGB 8.4* 8.3* 9.1* 10.6*  HCT 26.5* 26.2* 29.2* 34.3*  MCV 63.9* 64.1* 64.2* 64.8*  PLT 342 364 441* 542*   Basic Metabolic Panel: Recent Labs  Lab 08/12/17 0607 08/13/17 0520 08/14/17 0532  NA 140 140 139  K 3.0* 3.2* 4.4  CL 103 102 100*  CO2 28 29 29   GLUCOSE 78 77 104*  BUN 7 10 11   CREATININE 0.47 0.54 0.55  CALCIUM 8.8* 8.8* 9.3  MG 1.9 1.8 1.8   GFR: Estimated Creatinine Clearance: 107.2 mL/min (by C-G formula based on SCr of 0.55  mg/dL). Liver Function Tests: No results for input(s): AST, ALT, ALKPHOS, BILITOT, PROT, ALBUMIN in the last 168 hours. No results for input(s): LIPASE, AMYLASE in the last 168 hours. No results for input(s): AMMONIA in the last 168 hours. Coagulation Profile: No results for input(s): INR, PROTIME in the last 168 hours. Cardiac Enzymes: No results for input(s): CKTOTAL, CKMB, CKMBINDEX, TROPONINI in the last 168 hours. BNP (last 3 results) No results for input(s): PROBNP in the last 8760 hours. HbA1C: No results for input(s): HGBA1C in the last 72 hours. CBG: No results for input(s): GLUCAP in the last 168 hours. Lipid Profile: No results for input(s): CHOL, HDL, LDLCALC, TRIG, CHOLHDL, LDLDIRECT in the last 72 hours. Thyroid Function Tests: No results for input(s): TSH, T4TOTAL, FREET4, T3FREE, THYROIDAB in the last 72 hours. Anemia Panel: No results for input(s): VITAMINB12, FOLATE, FERRITIN, TIBC, IRON, RETICCTPCT in the last 72 hours. Sepsis Labs: No results for input(s): PROCALCITON, LATICACIDVEN in the last 168 hours.  Recent Results (from the past 240 hour(s))  Blood Culture (routine x 2)     Status: None   Collection Time: 08/10/17  6:33 PM  Result Value Ref Range Status   Specimen Description   Final    BLOOD RIGHT ANTECUBITAL Performed at [facility] [address], [place], [place] [address]    Special Requests   Final    BOTTLES DRAWN AEROBIC AND ANAEROBIC Blood Culture adequate volume Performed at [facility] [address], [place], [place] [address]    Culture   Final    NO GROWTH 5 DAYS Performed at [facility], [place] [address] [place], [place] [address]    Report Status 08/15/2017 FINAL  Final  Blood Culture (routine x 2)     Status: None   Collection Time: 08/11/17  6:00 AM  Result Value Ref Range Status   Specimen Description   Final    BLOOD LEFT HAND Performed at [facility]  [address], [place], [place] [address]    Special Requests   Final    BOTTLES DRAWN AEROBIC AND ANAEROBIC Blood Culture adequate volume Performed at [facility] [address], [place], [place] [address]    Culture   Final    NO GROWTH 5 DAYS Performed at [facility], [place] [address] [place],  [address]    Report Status 08/16/2017 FINAL  Final  Surgical PCR screen     Status: None   Collection Time: 08/12/17  8:21 PM  Result Value Ref Range Status   MRSA, PCR NEGATIVE NEGATIVE Final   Staphylococcus aureus NEGATIVE NEGATIVE Final    Comment: (NOTE) The Xpert SA Assay (FDA approved for NASAL specimens in patients 69 years of age and older), is one component of a comprehensive surveillance program. It is not intended to diagnose infection nor to guide or monitor treatment. Performed at [facility],  [place] [address] [place], [place] [address]   Aerobic/Anaerobic Culture (surgical/deep wound)     Status: None (Preliminary result)   Collection Time: 08/13/17  9:24 AM  Result Value Ref Range Status   Specimen Description   Final    ABSCESS LEFT NECK Performed at [facility] [address], [place], [place] [address]    Special Requests   Final    PATIENT ON FOLLOWING VANCOMYCIN Performed at [facility], [place] [address] [place], [place] [address]    Gram Stain   Final    MODERATE WBC PRESENT, PREDOMINANTLY PMN ABUNDANT GRAM POSITIVE COCCI IN PAIRS    Culture   Final    MODERATE NORMAL SKIN FLORA NO ANAEROBES ISOLATED Performed at [facility], [place] [address] [place], [place] [address]    Report Status PENDING  Incomplete         Radiology Studies: No results found.      Scheduled Meds: . acetaminophen  1,000 mg Oral TID  . amitriptyline  75 mg Oral QHS  . amLODipine  10 mg Oral Daily  . docusate sodium  100 mg Oral BID  . DULoxetine  60 mg Oral Daily  .  enoxaparin (LOVENOX) injection  40 mg Subcutaneous Daily  . lisinopril  10 mg Oral Daily   And  . hydrochlorothiazide  12.5 mg Oral Daily  . ibuprofen  600 mg Oral Q6H  . lactulose  30 g Oral Q6H  . nicotine  21 mg Transdermal Daily  . oxyCODONE  10 mg Oral Q12H  . pantoprazole  40 mg Oral BID  . polyethylene glycol  17 g Oral Daily  . tiZANidine  4 mg Oral TID   Continuous Infusions: . ampicillin-sulbactam (UNASYN) IV Stopped (08/18/17 1000)     LOS: 6 days    Dayten Juba Tanna Furry, MD [facility] Pager [phone]  If 7PM-7AM, please contact night-coverage [email or web address] Password [facility] 08/18/2017, 2:13 PM

## 2017-08-18 NOTE — Progress Notes (Deleted)
Pt HR rose to 140 with ambulation. MD notified. Instructed to get EKG. EKG has been obtained and placed in chart. MD notified. Pt's heart rate is now running 100-110 while she rests in bed. Will continue to monitor closely at this time.    

## 2017-08-19 MED ORDER — AMOXICILLIN-POT CLAVULANATE 875-125 MG PO TABS: 1.0000 | ORAL_TABLET | Freq: Two times a day (BID) | ORAL | 0 refills | Status: AC

## 2017-08-19 MED ORDER — DOCUSATE SODIUM 100 MG PO CAPS: 100.0000 mg | ORAL_CAPSULE | Freq: Two times a day (BID) | ORAL | 0 refills | Status: DC

## 2017-08-19 MED ORDER — TIZANIDINE HCL 4 MG PO TABS: 4.0000 mg | ORAL_TABLET | Freq: Three times a day (TID) | ORAL | 0 refills | Status: DC | PRN

## 2017-08-19 MED ORDER — AMOXICILLIN-POT CLAVULANATE 875-125 MG PO TABS: 1.0000 | ORAL_TABLET | Freq: Two times a day (BID) | ORAL | Status: DC

## 2017-08-19 MED ORDER — POLYETHYLENE GLYCOL 3350 17 G PO PACK: 17.0000 g | PACK | Freq: Every day | ORAL | 0 refills | Status: DC | PRN

## 2017-08-19 MED ORDER — IBUPROFEN 200 MG PO TABS: 400.0000 mg | ORAL_TABLET | Freq: Four times a day (QID) | ORAL | 0 refills | Status: DC | PRN

## 2017-08-19 NOTE — Discharge Summary (Signed)
Physician Discharge Summary  Terri [name] BDZ:[id number] DOB: 1976-05-14 DOA: 08/10/2017  PCP: [name], MD  Admit date: 08/10/2017 Discharge date: 08/19/2017  Admitted From:home Disposition:home  Recommendations for Outpatient Follow-up:  1. Follow up with PCP in 1-2 weeks 2. Please obtain BMP/CBC in one week 3. Please follow-up at Suboxone clinic   Home Health: No Equipment/Devices: No Discharge Condition: Stable CODE STATUS: Full code Diet recommendation: Heart healthy  Brief/Interim Summary: 42 year old female with history of hypertension, bipolar mood disorder, IV drug abuse presented to the ER with neck pain, swelling and erythema.  CT scan with cellulitis of neck with no abscess.  Admitted for further evaluation.  Patient was evaluated by ID and ENT.  Patient is a status post I&D of submandibular abscess.  #Cellulitis of submandibular region/abscess and osteomyelitis:  Status post I&D of submandibular abscess.  The drain was removed by ENT.  Treated with IV Unasyn and transitioned to oral Augmentin to complete 3 weeks  as per infectious disease recommendation. Plan to remove drained by ENT today.    #Pain management:  Patient was educated to avoid narcotics.  I have discussed with her regarding close follow-up with Suboxone clinic.  Social worker provided instructions and information regarding Suboxone clinic.  I provided muscle relaxant, NSAIDs for the pain management.  I stated to the patient that I would try to contact Suboxone clinic to make appointment for her however patient was in a hurry and stated she could not wait until discharge planning.  Patient reported that she is living now and does not care about the medication or instructions.  I stated to her that the first and best recommendation is to complete antibiotics therefore I did medication reconciled and discharge planning immediately.  I feel patient to take antibiotics.  She understand and I think she is capable  of making her own decision.  #IV drug abuse and tobacco abuse:   Education provided.  #Hypertension: Monitor blood pressure.  On Norvasc, lisinopril and hydrochlorothiazide  #Continue other home medication including amitriptyline, Cymbalta, stool softener.  Patient discharged in stable condition.  I have seen and examined patient in the presence of patient's nurse, social worker and pharmacist.  Discharge Diagnoses:  Principal Problem:   Cellulitis of submandibular region Active Problems:   Opioid abuse with opioid-induced mood disorder ([place])   Odontogenic infection of jaw   Cellulitis and abscess of neck   Essential hypertension    Discharge Instructions  Discharge Instructions    Call MD for:  difficulty breathing, headache or visual disturbances   Complete by:  As directed    Call MD for:  extreme fatigue   Complete by:  As directed    Call MD for:  hives   Complete by:  As directed    Call MD for:  persistant dizziness or light-headedness   Complete by:  As directed    Call MD for:  persistant nausea and vomiting   Complete by:  As directed    Call MD for:  redness, tenderness, or signs of infection (pain, swelling, redness, odor or green/yellow discharge around incision site)   Complete by:  As directed    Call MD for:  severe uncontrolled pain   Complete by:  As directed    Call MD for:  temperature >100.4   Complete by:  As directed    Diet - low sodium heart healthy   Complete by:  As directed    Discharge instructions   Complete by:  As  directed    Please follow up with your PCP and at Suboxone clinic.   Increase activity slowly   Complete by:  As directed      Allergies as of 08/19/2017      Reactions   Ketorolac Tromethamine Itching, Nausea And Vomiting   Methocarbamol Diarrhea      Medication List    STOP taking these medications   naproxen sodium 220 MG tablet Commonly known as:  ALEVE     TAKE these medications   acetaminophen 500 MG  tablet Commonly known as:  TYLENOL Take 1,000 mg by mouth every 6 (six) hours as needed for moderate pain.   albuterol 108 (90 Base) MCG/ACT inhaler Commonly known as:  PROVENTIL HFA;VENTOLIN HFA Inhale 1-2 puffs into the lungs every 4 (four) hours as needed for wheezing or shortness of breath.   amitriptyline 75 MG tablet Commonly known as:  ELAVIL Take 1 tablet (75 mg total) by mouth at bedtime.   amLODipine 10 MG tablet Commonly known as:  NORVASC Take 10 mg by mouth daily.   amoxicillin-clavulanate 875-125 MG tablet Commonly known as:  AUGMENTIN Take 1 tablet by mouth every 12 (twelve) hours for 12 days.   docusate sodium 100 MG capsule Commonly known as:  COLACE Take 1 capsule (100 mg total) by mouth 2 (two) times daily.   DULoxetine 60 MG capsule Commonly known as:  CYMBALTA Take 60 mg by mouth daily.   ibuprofen 200 MG tablet Commonly known as:  ADVIL,MOTRIN Take 2 tablets (400 mg total) by mouth every 6 (six) hours as needed for moderate pain.   lisinopril-hydrochlorothiazide 10-12.5 MG tablet Commonly known as:  PRINZIDE,ZESTORETIC Take 1 tablet by mouth daily.   pantoprazole 40 MG tablet Commonly known as:  PROTONIX Take 40 mg by mouth 2 (two) times daily.   polyethylene glycol packet Commonly known as:  MIRALAX / GLYCOLAX Take 17 g by mouth daily as needed.   tiZANidine 4 MG tablet Commonly known as:  ZANAFLEX Take 1 tablet (4 mg total) by mouth every 8 (eight) hours as needed for muscle spasms.      Follow-up Information    [name], MD. Schedule an appointment as soon as possible for a visit in 1 week(s).   Specialty:  Internal Medicine Contact information: [place] [address] [phone]        Melida Quitter, MD. Schedule an appointment as soon as possible for a visit in 1 week(s).   Specialty:  Otolaryngology Contact information: [address] [place] [address] [phone]           Allergies  Allergen Reactions  . Ketorolac Tromethamine Itching and Nausea And Vomiting  . Methocarbamol Diarrhea    Consultations: ENT Infectious disease  Procedures/Studies: I&D  Subjective: Seen and examined at bedside.  Patient wanted to leave as soon as possible and did not want to wait for any discharge planning.  However my recommendation was to at least have her take antibiotics.  Denied headache, dizziness, chest pain or shortness of breath.  Discharge Exam: Vitals:   08/18/17 2026 08/19/17 0527  BP: 125/86 102/69  Pulse: 91 65  Resp: 16 17  Temp: 98.3 F (36.8 C) 98.1 F (36.7 C)  SpO2: 100% 97%   Vitals:   08/18/17 0542 08/18/17 1530 08/18/17 2026 08/19/17 0527  BP: 137/87 118/80 125/86 102/69  Pulse: 89 77 91 65  Resp: 18 18 16 17   Temp: 98.7 F (37.1 C) 98.2  F (36.8 C) 98.3 F (36.8 C) 98.1 F (36.7 C)  TempSrc: Oral Oral Oral Oral  SpO2: 100% 98% 100% 97%  Weight:      Height:        General: Pt is alert, awake, not in acute distress The mandible small incision site looks clean without any discharge.  No surrounding erythema or swelling. Cardiovascular: RRR, S1/S2 +, no rubs, no gallops Respiratory: CTA bilaterally, no wheezing, no rhonchi Abdominal: Soft, NT, ND, bowel sounds + Extremities: no edema, no cyanosis    The results of significant diagnostics from this hospitalization (including imaging, microbiology, ancillary and laboratory) are listed below for reference.     Microbiology: Recent Results (from the past 240 hour(s))  Blood Culture (routine x 2)     Status: None   Collection Time: 08/10/17  6:33 PM  Result Value Ref Range Status   Specimen Description   Final    BLOOD RIGHT ANTECUBITAL Performed at [facility] [address] [place], [place] [address]    Special Requests   Final    BOTTLES DRAWN AEROBIC AND ANAEROBIC Blood Culture adequate volume Performed at [facility]  [address] [place], [place] [address]    Culture   Final    NO GROWTH 5 DAYS Performed at [facility], [place] [address], [place], [place] [address]    Report Status 08/15/2017 FINAL  Final  Blood Culture (routine x 2)     Status: None   Collection Time: 08/11/17  6:00 AM  Result Value Ref Range Status   Specimen Description   Final    BLOOD LEFT HAND Performed at [facility] [address], [place], [place] [address]    Special Requests   Final    BOTTLES DRAWN AEROBIC AND ANAEROBIC Blood Culture adequate volume Performed at [facility] [address], [place], [place] [address]    Culture   Final    NO GROWTH 5 DAYS Performed at [facility], [place] [address], [place], [place] [address]    Report Status 08/16/2017 FINAL  Final  Surgical PCR screen     Status: None   Collection Time: 08/12/17  8:21 PM  Result Value Ref Range Status   MRSA, PCR NEGATIVE NEGATIVE Final   Staphylococcus aureus NEGATIVE NEGATIVE Final    Comment: (NOTE) The Xpert SA Assay (FDA approved for NASAL specimens in patients 38 years of age and older), is one component of a comprehensive surveillance program. It is not intended to diagnose infection nor to guide or monitor treatment. Performed at [facility], [place] [address] [place], [place] [address]   Aerobic/Anaerobic Culture (surgical/deep wound)     Status: None   Collection Time: 08/13/17  9:24 AM  Result Value Ref Range Status   Specimen Description   Final    ABSCESS LEFT NECK Performed at [facility] [address] [place], [place] [address]    Special Requests   Final    PATIENT ON FOLLOWING VANCOMYCIN Performed at [facility], [place] [address], [place], [place] [address]    Gram Stain   Final    MODERATE WBC PRESENT, PREDOMINANTLY PMN ABUNDANT GRAM POSITIVE COCCI IN PAIRS    Culture   Final    MODERATE NORMAL SKIN FLORA NO  ANAEROBES ISOLATED Performed at [facility], [place] [address] [place], [place] [address]    Report Status 08/18/2017  FINAL  Final     Labs: BNP (last 3 results) No results for input(s): BNP in the last 8760 hours. Basic Metabolic Panel: Recent Labs  Lab 08/13/17 0520 08/14/17 0532  NA 140 139  K 3.2* 4.4  CL 102 100*  CO2 29 29  GLUCOSE 77 104*  BUN 10 11  CREATININE 0.54 0.55  CALCIUM 8.8* 9.3  MG 1.8 1.8   Liver Function Tests: No results for input(s): AST, ALT, ALKPHOS, BILITOT, PROT, ALBUMIN in the last 168 hours. No results for input(s): LIPASE, AMYLASE in the last 168 hours. No results for input(s): AMMONIA in the last 168 hours. CBC: Recent Labs  Lab 08/13/17 0520 08/14/17 0532 08/17/17 1015  WBC 12.8* 15.9* 9.9  NEUTROABS 7.7  --   --   HGB 8.3* 9.1* 10.6*  HCT 26.2* 29.2* 34.3*  MCV 64.1* 64.2* 64.8*  PLT 364 441* 508*   Cardiac Enzymes: No results for input(s): CKTOTAL, CKMB, CKMBINDEX, TROPONINI in the last 168 hours. BNP: Invalid input(s): POCBNP CBG: No results for input(s): GLUCAP in the last 168 hours. D-Dimer No results for input(s): DDIMER in the last 72 hours. Hgb A1c No results for input(s): HGBA1C in the last 72 hours. Lipid Profile No results for input(s): CHOL, HDL, LDLCALC, TRIG, CHOLHDL, LDLDIRECT in the last 72 hours. Thyroid function studies No results for input(s): TSH, T4TOTAL, T3FREE, THYROIDAB in the last 72 hours.  Invalid input(s): FREET3 Anemia work up No results for input(s): VITAMINB12, FOLATE, FERRITIN, TIBC, IRON, RETICCTPCT in the last 72 hours. Urinalysis    Component Value Date/Time   COLORURINE YELLOW 02/14/2017 1558   APPEARANCEUR CLEAR 02/14/2017 1558   LABSPEC 1.010 02/14/2017 1558   PHURINE 6.0 02/14/2017 1558   GLUCOSEU NEGATIVE 02/14/2017 1558   HGBUR NEGATIVE 02/14/2017 1558   BILIRUBINUR NEGATIVE 02/14/2017 1558   KETONESUR NEGATIVE 02/14/2017 1558   PROTEINUR NEGATIVE 02/14/2017 1558    UROBILINOGEN 1.0 02/09/2015 1228   NITRITE NEGATIVE 02/14/2017 1558   LEUKOCYTESUR NEGATIVE 02/14/2017 1558   Sepsis Labs Invalid input(s): PROCALCITONIN,  WBC,  LACTICIDVEN Microbiology Recent Results (from the past 240 hour(s))  Blood Culture (routine x 2)     Status: None   Collection Time: 08/10/17  6:33 PM  Result Value Ref Range Status   Specimen Description   Final    BLOOD RIGHT ANTECUBITAL Performed at [facility], [place] [address], [place], [place] [address]    Special Requests   Final    BOTTLES DRAWN AEROBIC AND ANAEROBIC Blood Culture adequate volume Performed at [facility] [address] [place], [place] [address]    Culture   Final    NO GROWTH 5 DAYS Performed at [facility], [place] [address] [place], [place] [address]    Report Status 08/15/2017 FINAL  Final  Blood Culture (routine x 2)     Status: None   Collection Time: 08/11/17  6:00 AM  Result Value Ref Range Status   Specimen Description   Final    BLOOD LEFT HAND Performed at [facility] [address], [place], [place] [address]    Special Requests   Final    BOTTLES DRAWN AEROBIC AND ANAEROBIC Blood Culture adequate volume Performed at [facility] [address], [place], [place] [address]    Culture   Final    NO GROWTH 5 DAYS Performed at [facility], [place] [address] [place], [place] [address]  Report Status 08/16/2017 FINAL  Final  Surgical PCR screen     Status: None   Collection Time: 08/12/17  8:21 PM  Result Value Ref Range Status   MRSA, PCR NEGATIVE NEGATIVE Final   Staphylococcus aureus NEGATIVE NEGATIVE Final    Comment: (NOTE) The Xpert SA Assay (FDA approved for NASAL specimens in patients 73 years of age and older), is one component of a comprehensive surveillance program. It is not intended to diagnose infection nor to guide or monitor treatment. Performed at [facility], [place] [address], [place], [place] [address]   Aerobic/Anaerobic Culture (surgical/deep wound)     Status: None   Collection Time: 08/13/17  9:24 AM  Result Value Ref Range Status   Specimen Description   Final    ABSCESS LEFT NECK Performed at [facility] [address] [place], [place] [address]    Special Requests   Final    PATIENT ON FOLLOWING VANCOMYCIN Performed at [facility], [place] [address], [place], [place] [address]    Gram Stain   Final    MODERATE WBC PRESENT, PREDOMINANTLY PMN ABUNDANT GRAM POSITIVE COCCI IN PAIRS    Culture   Final    MODERATE NORMAL SKIN FLORA NO ANAEROBES ISOLATED Performed at [facility], [place] [address] [place], [place] [address]    Report Status 08/18/2017 FINAL  Final     Time coordinating discharge: 31 minutes  SIGNED:   Rosita Fire, MD  [facility] 08/19/2017, 9:52 AM  If 7PM-7AM, please contact night-coverage [email or web address] Password [email or web address]

## 2017-08-19 NOTE — Clinical Social Work Note (Signed)
Clinical Social Work Assessment  Patient Details  Name: Terri [name] MRN: [id number] Date of Birth: 11-Apr-1976  Date of referral:  08/19/17               Reason for consult:  Substance Use/ETOH Abuse                Permission sought to share information with:  Family Supports Permission granted to share information::  Yes, Verbal Permission Granted  Name::     Actor::     Relationship::  SO  Contact Information:     Housing/Transportation Living arrangements for the past 2 months:  Tax adviser of Information:  Patient Patient Interpreter Needed:  None Criminal Activity/Legal Involvement Pertinent to Current Situation/Hospitalization:  No - Comment as needed Significant Relationships:  Dependent Children, Significant Other Lives with:  Significant Other, Minor Children Do you feel safe going back to the place where you live?  Yes Need for family participation in patient care:  Yes (Comment)  Care giving concerns:  No care giving concerns at time of assessment.    Social Worker assessment / plan:  LCSW following for SA resources.   Patient admitted for cellulitis of the neck. Patient recently had dental surgery and progressively got worse.  LCSW met at bedside with patient. Patients SO is present.  Patient reports using heroine. Patient reports that she has been using for about a year now. Patient is interested in a suboxone clinic. LCSW provided patient with outpatient treatment resources and suboxone clinic resources. Patient will follow up on resources on her own.   Patient reports that she lives with her SO, Terri [name] and her daughter. Patient request bus pass at dc.   PLAN: Patient will follow up with outpatient resources on her own.   Employment status:  Disabled (Comment on whether or not currently receiving Disability) Insurance information:  Medicare PT Recommendations:  Not assessed at this time Information / Referral to community resources:  Outpatient  Substance Abuse Treatment Options  Patient/Family's Response to care:  Patient is getting irritable and ready to leave hospital. Patient is thankful for resources and LCSW visit.   Patient/Family's Understanding of and Emotional Response to Diagnosis, Current Treatment, and Prognosis:  Patient is understanding of diagnosis and treatment plan. Patient request that attending give her more pain meds prior to leaving the hospital. Patient is encouraged to follow up with suboxone clinic.   Emotional Assessment Appearance:  Appears older than stated age Attitude/Demeanor/Rapport:    Affect (typically observed):  Frustrated Orientation:  Oriented to Self, Oriented to Place, Oriented to  Time, Oriented to Situation Alcohol / Substance use:  Illicit Drugs Psych involvement (Current and /or in the community):  No (Comment)  Discharge Needs  Concerns to be addressed:  No discharge needs identified Readmission within the last 30 days:  No Current discharge risk:  None Barriers to Discharge:  No Barriers Identified   Servando Snare, LCSW 08/19/2017, 9:55 AM

## 2017-08-19 NOTE — Progress Notes (Signed)
Discharge instructions and medications discussed with patient.  AVS given to patient.  All questions answered.  

## 2017-08-25 ENCOUNTER — Other Ambulatory Visit: Payer: Self-pay

## 2017-08-25 ENCOUNTER — Emergency Department (HOSPITAL_COMMUNITY)
Admission: EM | Admit: 2017-08-25 | Discharge: 2017-08-25 | Disposition: A | Discharge status: Home / Self Care | Payer: Medicare Other | Attending: Emergency Medicine | Admitting: Emergency Medicine

## 2017-08-25 DIAGNOSIS — F1110 Opioid abuse, uncomplicated: Secondary | ICD-10-CM

## 2017-08-25 DIAGNOSIS — R5383 Other fatigue: Secondary | ICD-10-CM | POA: Insufficient documentation

## 2017-08-25 DIAGNOSIS — Y939 Activity, unspecified: Secondary | ICD-10-CM | POA: Insufficient documentation

## 2017-08-25 DIAGNOSIS — Z79899 Other long term (current) drug therapy: Secondary | ICD-10-CM | POA: Insufficient documentation

## 2017-08-25 DIAGNOSIS — Y929 Unspecified place or not applicable: Secondary | ICD-10-CM | POA: Diagnosis not present

## 2017-08-25 DIAGNOSIS — R4182 Altered mental status, unspecified: Secondary | ICD-10-CM | POA: Diagnosis present

## 2017-08-25 DIAGNOSIS — Z7289 Other problems related to lifestyle: Secondary | ICD-10-CM

## 2017-08-25 DIAGNOSIS — F17210 Nicotine dependence, cigarettes, uncomplicated: Secondary | ICD-10-CM | POA: Diagnosis not present

## 2017-08-25 DIAGNOSIS — S70312A Abrasion, left thigh, initial encounter: Secondary | ICD-10-CM | POA: Diagnosis not present

## 2017-08-25 DIAGNOSIS — I10 Essential (primary) hypertension: Secondary | ICD-10-CM | POA: Diagnosis not present

## 2017-08-25 DIAGNOSIS — S70311A Abrasion, right thigh, initial encounter: Secondary | ICD-10-CM | POA: Diagnosis not present

## 2017-08-25 DIAGNOSIS — Y999 Unspecified external cause status: Secondary | ICD-10-CM | POA: Insufficient documentation

## 2017-08-25 DIAGNOSIS — X789XXA Intentional self-harm by unspecified sharp object, initial encounter: Secondary | ICD-10-CM | POA: Diagnosis not present

## 2017-08-25 NOTE — Discharge Instructions (Addendum)
Make sure to complete your course of antibiotics.

## 2017-08-25 NOTE — ED Notes (Signed)
Pt reports mouth pain d/t recently having dental surgery

## 2017-08-25 NOTE — ED Notes (Addendum)
Verbalized understanding discharge instructions. In no acute distress.  Pt reports she is going to call a cab or walk home.  Prior to discharge, Pt was able to walk to the restroom and back to bed w/o difficulty.
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
## 2017-08-25 NOTE — ED Provider Notes (Signed)
[facility] DEPT Provider Note   CSN: [id number] Arrival date & time: 08/25/17  2205     History   Chief Complaint Chief Complaint  Patient presents with  . Altered Mental Status    HPI Terri [name] is a 42 y.o. female.  The history is provided by the patient.  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  She does admit to using heroin at about 6:30 PM (snorting).  She states she feels fine now.  She states that she is still taking the antibiotic which was prescribed for her upon discharge from the hospital.  She is trying to get into a substance abuse residential program but does admit that she has been under increased stress because her father recently had bypass surgery.  Over the last several days, she has cut her anterior thighs.  She states that she does this to relieve stress and denies suicidal ideation.  Past Medical History:  Diagnosis Date  . Bipolar disorder ([place])   . Chronic back pain   . Hypertension   . Migraine headache   . Sciatica   . Stress incontinence     Patient Active Problem List   Diagnosis Date Noted  . Essential hypertension   . Cellulitis and abscess of neck 08/12/2017  . Cellulitis of submandibular region 08/10/2017  . Odontogenic infection of jaw 08/10/2017  . Cholecystitis 02/14/2017  . Opioid abuse with opioid-induced mood disorder ([place]) 01/25/2017  . Anxiety 02/11/2014  . Depression 07/12/2012    Past Surgical History:  Procedure Laterality Date  . ANKLE SURGERY    . BACK SURGERY    . CESAREAN SECTION    . CHOLECYSTECTOMY N/A 02/15/2017   Procedure: LAPAROSCOPIC CHOLECYSTECTOMY;  Surgeon: Clovis Riley, MD;  Location: [facility];  Service: General;  Laterality:  N/A;  . FRACTURE SURGERY    . GASTRIC BYPASS    . INCISION AND DRAINAGE OF PERITONSILLAR ABCESS Left 08/13/2017   Procedure: INCISION AND DRAINAGE OF LEFT NECK ABSCESS;  Surgeon: Melida Quitter, MD;  Location: [facility];  Service: ENT;  Laterality: Left;  . LAPAROSCOPIC LYSIS OF ADHESIONS  02/15/2017   Procedure: LAPAROSCOPIC LYSIS OF ADHESIONS;  Surgeon: Clovis Riley, MD;  Location: [facility];  Service: General;;  . LUMBAR [place]    . LUMBAR FUSION    . TUBAL LIGATION    . UPPER GI ENDOSCOPY N/A 02/15/2017   Procedure: UPPER GI ENDOSCOPY;  Surgeon: Clovis Riley, MD;  Location: [facility];  Service: General;  Laterality: N/A;     OB History   None      Home Medications    Prior to Admission medications   Medication Sig Start Date End Date Taking? Authorizing Provider  acetaminophen (TYLENOL) 500 MG tablet Take 1,000 mg by mouth every 6 (six) hours as needed for moderate pain.    [provider]  albuterol (PROVENTIL HFA;VENTOLIN HFA) 108 (90 BASE) MCG/ACT inhaler Inhale 1-2 puffs into the lungs every 4 (four) hours as needed for wheezing or shortness of breath. 03/07/15   Pisciotta, Elmyra Ricks, PA-C  amitriptyline (ELAVIL) 75 MG tablet Take 1 tablet (75 mg total) by mouth at bedtime. 02/11/14   Patrecia Pour, NP  amLODipine (NORVASC) 10 MG tablet  Take 10 mg by mouth daily.    [provider]  amoxicillin-clavulanate (AUGMENTIN) 875-125 MG tablet Take 1 tablet by mouth every 12 (twelve) hours for 12 days. 08/19/17 08/31/17  Rosita Fire, MD  docusate sodium (COLACE) 100 MG capsule Take 1 capsule (100 mg total) by mouth 2 (two) times daily. 08/19/17   Rosita Fire, MD  DULoxetine (CYMBALTA) 60 MG capsule Take 60 mg by mouth daily. 12/23/14   [provider]  ibuprofen (ADVIL,MOTRIN) 200 MG tablet Take 2 tablets (400 mg total) by mouth every 6 (six) hours as needed for moderate pain. 08/19/17   Rosita Fire, MD    lisinopril-hydrochlorothiazide (PRINZIDE,ZESTORETIC) 10-12.5 MG tablet Take 1 tablet by mouth daily. 01/22/17   [provider]  pantoprazole (PROTONIX) 40 MG tablet Take 40 mg by mouth 2 (two) times daily. 12/16/16   [provider]  polyethylene glycol (MIRALAX / GLYCOLAX) packet Take 17 g by mouth daily as needed. 08/19/17   Rosita Fire, MD  tiZANidine (ZANAFLEX) 4 MG tablet Take 1 tablet (4 mg total) by mouth every 8 (eight) hours as needed for muscle spasms. 08/19/17   Rosita Fire, MD    Family History Family History  Problem Relation Age of Onset  . Diabetes Mother   . Hypertension Mother   . Diabetes Father   . Hypertension Father     Social History Social History   Tobacco Use  . Smoking status: Current Every Day Smoker    Packs/day: 1.00    Types: Cigarettes  . Smokeless tobacco: Never Used  Substance Use Topics  . Alcohol use: No  . Drug use: Yes    Types: IV    Comment: Heroin     Allergies   Ketorolac tromethamine and Methocarbamol   Review of Systems Review of Systems  All other systems reviewed and are negative.    Physical Exam Updated Vital Signs BP 113/82 (BP Location: Left Arm)   Pulse 92   Temp 98.7 F (37.1 C) (Oral)   Resp 16   LMP 08/03/2017 (Approximate)   SpO2 95%   Physical Exam  Nursing note and vitals reviewed.  42 year old female, resting comfortably and in no acute distress. Vital signs are normal. Oxygen saturation is 95%, which is normal. Head is normocephalic and atraumatic. PERRLA, EOMI. Oropharynx is clear. Neck is nontender and supple without adenopathy or JVD. Back is nontender and there is no CVA tenderness. Lungs are clear without rales, wheezes, or rhonchi. Chest is nontender. Heart has regular rate and rhythm without murmur. Abdomen is soft, flat, nontender without masses or hepatosplenomegaly and peristalsis is normoactive. Extremities have no cyanosis or edema, full range of  motion is present.  Multiple superficial lacerations present on anterior surface of both thighs consistent with self-mutilation. Skin is warm and dry without rash. Neurologic: Mental status is normal, cranial nerves are intact, there are no motor or sensory deficits.  She is awake and alert and oriented.  ED Treatments / Results   Procedures Procedures  Medications Ordered in ED Medications - No data to display   Initial Impression / Assessment and Plan / ED Course  I have reviewed the triage vital signs and the nursing notes.  Lethargy probably related to heroin use.  Old records are reviewed, and she has prior ED visit for accidental heroin overdose.  Also noted recent hospitalization for submandibular abscess and osteomyelitis of the mandible.  She was discharged with a 3-week course of  Augmentin, which patient states she has been compliant with.  At this point, I do not see any indication for any testing.  She is approximately 5 hours after her heroin use and is showing no evidence of ongoing toxicity.  She is encouraged to follow through with her desire to go to residential treatment program.  Also encouraged to follow-up with her ENT physician to make sure that the abscess and osteomyelitis have been adequately treated.  Final Clinical Impressions(s) / ED Diagnoses   Final diagnoses:  Lethargy  Heroin abuse [facility])  Self mutilating behavior    ED Discharge Orders    None       Delora Fuel, MD 64/31/42 2343

## 2017-08-25 NOTE — ED Notes (Signed)
Bed: [facility] Expected date:  Expected time:  Means of arrival:  Comments: EMS 42 yo female lethargic-used heroin at 1930/O2 sat 90-100

## 2017-09-06 ENCOUNTER — Emergency Department (HOSPITAL_COMMUNITY)
Admission: EM | Admit: 2017-09-06 | Discharge: 2017-09-06 | Disposition: A | Discharge status: Home / Self Care | Payer: Medicare Other | Attending: Emergency Medicine | Admitting: Emergency Medicine

## 2017-09-06 ENCOUNTER — Other Ambulatory Visit: Payer: Self-pay

## 2017-09-06 DIAGNOSIS — Z5321 Procedure and treatment not carried out due to patient leaving prior to being seen by health care provider: Secondary | ICD-10-CM | POA: Diagnosis not present

## 2017-09-06 DIAGNOSIS — M79641 Pain in right hand: Secondary | ICD-10-CM | POA: Insufficient documentation

## 2017-09-06 LAB — CBC WITH DIFFERENTIAL/PLATELET
BASOS PCT: 1 %
Basophils Absolute: 0.1 10*3/uL (ref 0.0–0.1)
EOS PCT: 1 %
Eosinophils Absolute: 0.1 10*3/uL (ref 0.0–0.7)
HCT: 31.3 % — ABNORMAL LOW (ref 36.0–46.0)
Hemoglobin: 9.9 g/dL — ABNORMAL LOW (ref 12.0–15.0)
LYMPHS ABS: 1.4 10*3/uL (ref 0.7–4.0)
Lymphocytes Relative: 22 %
MCH: 20.7 pg — AB (ref 26.0–34.0)
MCHC: 31.6 g/dL (ref 30.0–36.0)
MCV: 65.5 fL — AB (ref 78.0–100.0)
MONO ABS: 0.3 10*3/uL (ref 0.1–1.0)
Monocytes Relative: 4 %
NEUTROS ABS: 4.4 10*3/uL (ref 1.7–7.7)
Neutrophils Relative %: 72 %
PLATELETS: 311 10*3/uL (ref 150–400)
RBC: 4.78 MIL/uL (ref 3.87–5.11)
RDW: 22.3 % — ABNORMAL HIGH (ref 11.5–15.5)
WBC: 6.3 10*3/uL (ref 4.0–10.5)

## 2017-09-06 LAB — COMPREHENSIVE METABOLIC PANEL
ALK PHOS: 128 U/L — AB (ref 38–126)
ALT: 11 U/L — AB (ref 14–54)
AST: 28 U/L (ref 15–41)
Albumin: 3.3 g/dL — ABNORMAL LOW (ref 3.5–5.0)
Anion gap: 9 (ref 5–15)
CO2: 20 mmol/L — AB (ref 22–32)
CREATININE: 0.55 mg/dL (ref 0.44–1.00)
Calcium: 8.9 mg/dL (ref 8.9–10.3)
Chloride: 109 mmol/L (ref 101–111)
GFR calc non Af Amer: >60 mL/min (ref >60)
GLUCOSE: 86 mg/dL (ref 65–99)
Potassium: 4.2 mmol/L (ref 3.5–5.1)
SODIUM: 138 mmol/L (ref 135–145)
Total Bilirubin: 0.5 mg/dL (ref 0.3–1.2)
Total Protein: 6.6 g/dL (ref 6.5–8.1)

## 2017-09-06 NOTE — ED Notes (Signed)
Pt seen leaving  

## 2017-09-06 NOTE — ED Triage Notes (Signed)
Pt to ER for evaluation of right medial hand/thumb with obvious infection and drainage. Pt reports does use heroin but "does not inject." pt reports has been on 2 antibiotics without relief, states is currently still taking them.

## 2017-09-06 NOTE — ED Notes (Signed)
This RN offered tylenol for pt's hand pain. Pt followed RN to get the pain medication. Pt asked RN how long wait was. RN informed her of time and that it can vary person to person. Thanked pt for waiting. Pt stormed off and said, "you know what, never mind." RN asked pt if she still wanted tylenol as she walked off. Pt did not answer and continued to walk away.

## 2017-09-08 ENCOUNTER — Other Ambulatory Visit: Payer: Self-pay

## 2017-09-08 ENCOUNTER — Encounter (HOSPITAL_COMMUNITY): Payer: Self-pay

## 2017-09-08 ENCOUNTER — Emergency Department (HOSPITAL_COMMUNITY): Payer: Medicare Other

## 2017-09-08 ENCOUNTER — Emergency Department (HOSPITAL_COMMUNITY)
Admission: EM | Admit: 2017-09-08 | Discharge: 2017-09-08 | Disposition: A | Discharge status: Home / Self Care | Payer: Medicare Other | Attending: Emergency Medicine | Admitting: Emergency Medicine

## 2017-09-08 DIAGNOSIS — Z79899 Other long term (current) drug therapy: Secondary | ICD-10-CM | POA: Insufficient documentation

## 2017-09-08 DIAGNOSIS — I10 Essential (primary) hypertension: Secondary | ICD-10-CM | POA: Diagnosis not present

## 2017-09-08 DIAGNOSIS — Z23 Encounter for immunization: Secondary | ICD-10-CM | POA: Insufficient documentation

## 2017-09-08 DIAGNOSIS — R21 Rash and other nonspecific skin eruption: Secondary | ICD-10-CM | POA: Diagnosis present

## 2017-09-08 DIAGNOSIS — L03113 Cellulitis of right upper limb: Secondary | ICD-10-CM | POA: Diagnosis not present

## 2017-09-08 DIAGNOSIS — F17210 Nicotine dependence, cigarettes, uncomplicated: Secondary | ICD-10-CM | POA: Diagnosis not present

## 2017-09-08 LAB — BASIC METABOLIC PANEL
Anion gap: 7 (ref 5–15)
BUN: 10 mg/dL (ref 6–20)
CALCIUM: 8.5 mg/dL — AB (ref 8.9–10.3)
CHLORIDE: 109 mmol/L (ref 101–111)
CO2: 25 mmol/L (ref 22–32)
CREATININE: 0.52 mg/dL (ref 0.44–1.00)
GFR calc non Af Amer: >60 mL/min (ref >60)
GLUCOSE: 84 mg/dL (ref 65–99)
Potassium: 4 mmol/L (ref 3.5–5.1)
Sodium: 141 mmol/L (ref 135–145)

## 2017-09-08 LAB — CBC WITH DIFFERENTIAL/PLATELET
Basophils Absolute: 0.1 10*3/uL (ref 0.0–0.1)
Basophils Relative: 1 %
EOS PCT: 3 %
Eosinophils Absolute: 0.3 10*3/uL (ref 0.0–0.7)
HEMATOCRIT: 26 % — AB (ref 36.0–46.0)
HEMOGLOBIN: 8 g/dL — AB (ref 12.0–15.0)
Lymphocytes Relative: 31 %
Lymphs Abs: 2.7 10*3/uL (ref 0.7–4.0)
MCH: 20.2 pg — AB (ref 26.0–34.0)
MCHC: 30.8 g/dL (ref 30.0–36.0)
MCV: 65.5 fL — ABNORMAL LOW (ref 78.0–100.0)
MONO ABS: 0.5 10*3/uL (ref 0.1–1.0)
Monocytes Relative: 6 %
NEUTROS ABS: 5.1 10*3/uL (ref 1.7–7.7)
Neutrophils Relative %: 59 %
Platelets: 294 10*3/uL (ref 150–400)
RBC: 3.97 MIL/uL (ref 3.87–5.11)
RDW: 22.2 % — AB (ref 11.5–15.5)
WBC: 8.7 10*3/uL (ref 4.0–10.5)

## 2017-09-08 LAB — I-STAT CG4 LACTIC ACID, ED: Lactic Acid, Venous: 0.63 mmol/L (ref 0.5–1.9)

## 2017-09-08 MED ORDER — CLINDAMYCIN PHOSPHATE 600 MG/50ML IV SOLN
600.0000 mg | Freq: Once | INTRAVENOUS | Status: AC
  Administered 2017-09-08: 600 mg via INTRAVENOUS
  Filled 2017-09-08: qty 50

## 2017-09-08 MED ORDER — MORPHINE SULFATE (PF) 4 MG/ML IV SOLN
4.0000 mg | Freq: Once | INTRAVENOUS | Status: AC
  Administered 2017-09-08: 4 mg via INTRAVENOUS
  Filled 2017-09-08: qty 1

## 2017-09-08 MED ORDER — TETANUS-DIPHTH-ACELL PERTUSSIS 5-2.5-18.5 LF-MCG/0.5 IM SUSP
0.5000 mL | Freq: Once | INTRAMUSCULAR | Status: AC
  Administered 2017-09-08: 0.5 mL via INTRAMUSCULAR
  Filled 2017-09-08: qty 0.5

## 2017-09-08 NOTE — ED Triage Notes (Signed)
Pt arrives complaining of an abscess between her R thumb and index finger. It appears infected and has purulent drainage. Pt states that she uses heroin occasionally, but does not "shoot up." She also reports that she was recently discharged from the hospital for a throat abscess. She was triaged at [facility] 2 days ago, but left d/t wait time. Complaining of 10/10 pain.

## 2017-09-08 NOTE — Discharge Instructions (Addendum)
You have an infection involving the skin of your right hand.  Continue taking clindamycin antibiotic as previously prescribed.  Follow up closely with hand specialist for further care.

## 2017-09-08 NOTE — ED Notes (Signed)
Pt refused to sign discharge and was cursing at staff, pt left prior to discharge instructions.

## 2017-09-08 NOTE — ED Provider Notes (Signed)
[facility] DEPT Provider Note   CSN: [id number] Arrival date & time: 09/08/17  1636     History   Chief Complaint Chief Complaint  Patient presents with  . Abscess    HPI Terri [name] is a 42 y.o. female.  HPI   42 year old female with history of bipolar, opiate abuse including heroin use presenting for evaluation of hand infection.  Patient report she noticed a small nick on her skin involving her right hand approximately 2 weeks ago.  It has since increased in size, become much more painful and red.  It is now oozing out fluid.  Pain is throbbing, 10 out of 10, and radiates towards her forearm.  Pain worse with palpation or with movement.  No associated fever, chills, lightheadedness, dizziness, nausea, vomiting.  No chest pain or trouble breathing.  She admits to heroin use and states last use was a week ago.  She denies IV drug use.  She report a remote history of cocaine use but none recently.  She is unable to recall her last tetanus status.  She did mention developing osteomyelitis of her left jaw at the beginning of this month requiring hospitalization and IV antibiotic.  Past Medical History:  Diagnosis Date  . Bipolar disorder ([place])   . Chronic back pain   . Hypertension   . Migraine headache   . Sciatica   . Stress incontinence     Patient Active Problem List   Diagnosis Date Noted  . Essential hypertension   . Cellulitis and abscess of neck 08/12/2017  . Cellulitis of submandibular region 08/10/2017  . Odontogenic infection of jaw 08/10/2017  . Cholecystitis 02/14/2017  . Opioid abuse with opioid-induced mood disorder ([place]) 01/25/2017  . Anxiety 02/11/2014  . Depression 07/12/2012    Past Surgical History:  Procedure Laterality Date  . ANKLE SURGERY    . BACK SURGERY    . CESAREAN SECTION    . CHOLECYSTECTOMY N/A 02/15/2017   Procedure: LAPAROSCOPIC CHOLECYSTECTOMY;  Surgeon: Clovis Riley, MD;  [facility];   Service: General;  Laterality: N/A;  . FRACTURE SURGERY    . GASTRIC BYPASS    . INCISION AND DRAINAGE OF PERITONSILLAR ABCESS Left 08/13/2017   Procedure: INCISION AND DRAINAGE OF LEFT NECK ABSCESS;  Surgeon: Melida Quitter, MD;  [facility];  Service: ENT;  Laterality: Left;  . LAPAROSCOPIC LYSIS OF ADHESIONS  02/15/2017   Procedure: LAPAROSCOPIC LYSIS OF ADHESIONS;  Surgeon: Clovis Riley, MD;  [facility]: [facility];  Service: General;;  . LUMBAR [place]    . LUMBAR FUSION    . TUBAL LIGATION    . UPPER GI ENDOSCOPY N/A 02/15/2017   Procedure: UPPER GI ENDOSCOPY;  Surgeon: Clovis Riley, MD;  [facility];  Service: General;  Laterality: N/A;     OB History   None      Home Medications    Prior to Admission medications   Medication Sig Start Date End Date Taking? Authorizing Provider  acetaminophen (TYLENOL) 500 MG tablet Take 1,000 mg by mouth every 6 (six) hours as needed for moderate pain.    [provider]  albuterol (PROVENTIL HFA;VENTOLIN HFA) 108 (90 BASE) MCG/ACT inhaler Inhale 1-2 puffs into the lungs every 4 (four) hours as needed for wheezing or shortness of breath. 03/07/15   Pisciotta, Elmyra Ricks, PA-C  amitriptyline (ELAVIL) 75 MG tablet Take 1 tablet (75 mg total) by mouth at bedtime. 02/11/14   Patrecia Pour,  NP  amLODipine (NORVASC) 10 MG tablet Take 10 mg by mouth daily.    [provider]  docusate sodium (COLACE) 100 MG capsule Take 1 capsule (100 mg total) by mouth 2 (two) times daily. 08/19/17   Rosita Fire, MD  DULoxetine (CYMBALTA) 60 MG capsule Take 60 mg by mouth daily. 12/23/14   [provider]  ibuprofen (ADVIL,MOTRIN) 200 MG tablet Take 2 tablets (400 mg total) by mouth every 6 (six) hours as needed for moderate pain. 08/19/17   Rosita Fire, MD  lisinopril-hydrochlorothiazide (PRINZIDE,ZESTORETIC) 10-12.5 MG tablet Take 1 tablet by mouth daily. 01/22/17   [provider]  pantoprazole  (PROTONIX) 40 MG tablet Take 40 mg by mouth 2 (two) times daily. 12/16/16   [provider]  polyethylene glycol (MIRALAX / GLYCOLAX) packet Take 17 g by mouth daily as needed. 08/19/17   Rosita Fire, MD  tiZANidine (ZANAFLEX) 4 MG tablet Take 1 tablet (4 mg total) by mouth every 8 (eight) hours as needed for muscle spasms. 08/19/17   Rosita Fire, MD    Family History Family History  Problem Relation Age of Onset  . Diabetes Mother   . Hypertension Mother   . Diabetes Father   . Hypertension Father     Social History Social History   Tobacco Use  . Smoking status: Current Every Day Smoker    Packs/day: 1.00    Types: Cigarettes  . Smokeless tobacco: Never Used  Substance Use Topics  . Alcohol use: No  . Drug use: Yes    Types: IV    Comment: Heroin     Allergies   Ketorolac tromethamine and Methocarbamol   Review of Systems Review of Systems  All other systems reviewed and are negative.    Physical Exam Updated Vital Signs BP (!) 137/96 (BP Location: Right Arm)   Pulse 87   Temp 98.2 F (36.8 C) (Oral)   Resp 18   Ht 5\' 10"  (1.778 m)   Wt 84.4 kg (186 lb)   SpO2 99%   BMI 26.69 kg/m   Physical Exam  Constitutional: She appears well-developed and well-nourished. No distress.  Patient appears disheveled, crying, appears uncomfortable  HENT:  Head: Atraumatic.  Eyes: Conjunctivae are normal.  Neck: Neck supple.  Cardiovascular: Normal rate, regular rhythm and intact distal pulses.  No murmur heard. Pulmonary/Chest: Effort normal and breath sounds normal.  Abdominal: Soft. There is no tenderness.  Musculoskeletal: She exhibits tenderness (Right hand: There is a shallow ulceration approximately 1 cm in diameter with surrounding skin erythema noted to the dorsum of the right hand between first and second metacarpal region.  It is tender to palpation without any discharge. ).  Neurological: She is alert.  Skin: No rash noted.    Psychiatric: She has a normal mood and affect.  Nursing note and vitals reviewed.    ED Treatments / Results  Labs (all labs ordered are listed, but only abnormal results are displayed) Labs Reviewed  CBC WITH DIFFERENTIAL/PLATELET - Abnormal; Notable for the following components:      Result Value   Hemoglobin 8.0 (*)    HCT 26.0 (*)    MCV 65.5 (*)    MCH 20.2 (*)    RDW 22.2 (*)    All other components within normal limits  BASIC METABOLIC PANEL - Abnormal; Notable for the following components:   Calcium 8.5 (*)    All other components within normal limits  I-STAT CG4 LACTIC ACID,  ED    EKG None  Radiology Dg Hand Complete Right  Result Date: 09/08/2017 CLINICAL DATA:  42 y/o F; wound between thumb and index finger. Rule out osteomyelitis. EXAM: RIGHT HAND - COMPLETE 3+ VIEW COMPARISON:  02/26/2014 right hand radiograph. FINDINGS: There is no evidence of fracture or dislocation. Stable lucent focus within the first distal phalanx likely representing an enchondroma. No lytic or destructive process of the bones. IMPRESSION: No findings of osteomyelitis. 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 Electronically Signed   By: Kristine Garbe M.D.   On: 09/08/2017 19:42    Procedures Procedures (including critical care time)  Medications Ordered in ED Medications - No data to display   Initial Impression / Assessment and Plan / ED Course  I have reviewed the triage vital signs and the nursing notes.  Pertinent labs & imaging results that were available during my care of the patient were reviewed by me and considered in my medical decision making (see chart for details).     BP (!) 137/96 (BP Location: Right Arm)   Pulse 87   Temp 98.2 F (36.8 C) (Oral)   Resp 18   Ht 5\' 10"  (1.778 m)   Wt 84.4 kg (186 lb)   LMP 09/01/2017   SpO2 99%   BMI 26.69 kg/m    Final Clinical Impressions(s) / ED Diagnoses   Final diagnoses:   Cellulitis of right hand    ED Discharge Orders    None     7:10 PM Patient with history of heroin abuse, remote history of cocaine use here with a wound on her right dominant hand.  The wound manifest to the dorsal hand between the first and second metacarpal region.  It is tender to palpation with surrounding skin erythema concerning for cellulitis as well.  No obvious abscess.  She is neurovascularly intact.  She is not up-to-date with tetanus.  Will update tetanus, give pain medication, check labs, and will initiate antibiotic treatment.  X-ray ordered to rule out osteomyelitis.  9:32 PM X-ray of right hand without concerning feature, no evidence to suggest osteomyelitis.  Patient has normal WBC, normal lactic acid.  She is anemic with a hemoglobin of 8.0 which is mildly decreased from prior but she has history of chronic anemia.  Electrolytes panels are reassuring.  Patient received IV clindamycin while in the ER as well as pain medication.  Patient made me aware that she is currently on clindamycin and amoxicillin at home due to recent osteomyelitis in her jaw.  At this time, I encourage patient to continue with antibiotic but recommend to follow-up outpatient with hand specialist for further care of her condition.  Due to history of opiate abuse, no opiate pain medication was prescribed.  Return precautions discussed.  She is afebrile.  She ambulate without difficulty.  Vital signs stable.   Domenic Moras, PA-C 09/08/17 2134    Daleen Bo, MD 09/08/17 [phone]

## 2017-09-12 ENCOUNTER — Inpatient Hospital Stay (HOSPITAL_COMMUNITY)
Admission: EM | Admit: 2017-09-12 | Discharge: 2017-09-15 | DRG: 918 | Disposition: A | Discharge status: Home / Self Care | Payer: Medicare Other | Attending: Internal Medicine | Admitting: Internal Medicine

## 2017-09-12 ENCOUNTER — Encounter (HOSPITAL_COMMUNITY): Payer: Self-pay

## 2017-09-12 DIAGNOSIS — T40605A Adverse effect of unspecified narcotics, initial encounter: Secondary | ICD-10-CM

## 2017-09-12 DIAGNOSIS — T401X1A Poisoning by heroin, accidental (unintentional), initial encounter: Principal | ICD-10-CM | POA: Diagnosis present

## 2017-09-12 DIAGNOSIS — Z8249 Family history of ischemic heart disease and other diseases of the circulatory system: Secondary | ICD-10-CM

## 2017-09-12 DIAGNOSIS — Z6826 Body mass index (BMI) 26.0-26.9, adult: Secondary | ICD-10-CM

## 2017-09-12 DIAGNOSIS — Z791 Long term (current) use of non-steroidal anti-inflammatories (NSAID): Secondary | ICD-10-CM

## 2017-09-12 DIAGNOSIS — R0681 Apnea, not elsewhere classified: Secondary | ICD-10-CM | POA: Diagnosis present

## 2017-09-12 DIAGNOSIS — Z79891 Long term (current) use of opiate analgesic: Secondary | ICD-10-CM

## 2017-09-12 DIAGNOSIS — Z888 Allergy status to other drugs, medicaments and biological substances status: Secondary | ICD-10-CM

## 2017-09-12 DIAGNOSIS — Z981 Arthrodesis status: Secondary | ICD-10-CM

## 2017-09-12 DIAGNOSIS — E440 Moderate protein-calorie malnutrition: Secondary | ICD-10-CM | POA: Diagnosis present

## 2017-09-12 DIAGNOSIS — G894 Chronic pain syndrome: Secondary | ICD-10-CM | POA: Diagnosis present

## 2017-09-12 DIAGNOSIS — R195 Other fecal abnormalities: Secondary | ICD-10-CM | POA: Diagnosis present

## 2017-09-12 DIAGNOSIS — Z9049 Acquired absence of other specified parts of digestive tract: Secondary | ICD-10-CM

## 2017-09-12 DIAGNOSIS — R1013 Epigastric pain: Secondary | ICD-10-CM | POA: Diagnosis present

## 2017-09-12 DIAGNOSIS — D649 Anemia, unspecified: Secondary | ICD-10-CM

## 2017-09-12 DIAGNOSIS — F319 Bipolar disorder, unspecified: Secondary | ICD-10-CM | POA: Diagnosis present

## 2017-09-12 DIAGNOSIS — Z833 Family history of diabetes mellitus: Secondary | ICD-10-CM

## 2017-09-12 DIAGNOSIS — Y92009 Unspecified place in unspecified non-institutional (private) residence as the place of occurrence of the external cause: Secondary | ICD-10-CM

## 2017-09-12 DIAGNOSIS — K922 Gastrointestinal hemorrhage, unspecified: Secondary | ICD-10-CM | POA: Diagnosis present

## 2017-09-12 DIAGNOSIS — F1410 Cocaine abuse, uncomplicated: Secondary | ICD-10-CM | POA: Diagnosis present

## 2017-09-12 DIAGNOSIS — I10 Essential (primary) hypertension: Secondary | ICD-10-CM | POA: Diagnosis present

## 2017-09-12 DIAGNOSIS — Z79899 Other long term (current) drug therapy: Secondary | ICD-10-CM

## 2017-09-12 DIAGNOSIS — M549 Dorsalgia, unspecified: Secondary | ICD-10-CM | POA: Diagnosis present

## 2017-09-12 DIAGNOSIS — D62 Acute posthemorrhagic anemia: Secondary | ICD-10-CM | POA: Diagnosis present

## 2017-09-12 DIAGNOSIS — R0689 Other abnormalities of breathing: Secondary | ICD-10-CM

## 2017-09-12 DIAGNOSIS — R0902 Hypoxemia: Secondary | ICD-10-CM

## 2017-09-12 DIAGNOSIS — F419 Anxiety disorder, unspecified: Secondary | ICD-10-CM | POA: Diagnosis present

## 2017-09-12 DIAGNOSIS — T402X1A Poisoning by other opioids, accidental (unintentional), initial encounter: Secondary | ICD-10-CM

## 2017-09-12 DIAGNOSIS — E876 Hypokalemia: Secondary | ICD-10-CM

## 2017-09-12 DIAGNOSIS — Z9884 Bariatric surgery status: Secondary | ICD-10-CM

## 2017-09-12 DIAGNOSIS — F17210 Nicotine dependence, cigarettes, uncomplicated: Secondary | ICD-10-CM | POA: Diagnosis present

## 2017-09-12 DIAGNOSIS — D509 Iron deficiency anemia, unspecified: Secondary | ICD-10-CM | POA: Diagnosis present

## 2017-09-12 DIAGNOSIS — Z7151 Drug abuse counseling and surveillance of drug abuser: Secondary | ICD-10-CM

## 2017-09-12 MED ORDER — NALOXONE HCL 0.4 MG/ML IJ SOLN
0.4000 mg | Freq: Once | INTRAMUSCULAR | Status: AC
  Administered 2017-09-12: 0.4 mg via INTRAVENOUS
  Filled 2017-09-12: qty 1

## 2017-09-12 NOTE — ED Triage Notes (Signed)
pts daughter called EMS because she staed she had stopped breathing two times EMS reports she almost fell on her face while they were talking to her No Narcan has been given at this time

## 2017-09-12 NOTE — ED Notes (Signed)
RN at bedside collecting labs 

## 2017-09-13 ENCOUNTER — Inpatient Hospital Stay (HOSPITAL_COMMUNITY): Payer: Medicare Other

## 2017-09-13 ENCOUNTER — Encounter (HOSPITAL_COMMUNITY): Payer: Self-pay | Admitting: Gastroenterology

## 2017-09-13 ENCOUNTER — Other Ambulatory Visit: Payer: Self-pay

## 2017-09-13 DIAGNOSIS — F17210 Nicotine dependence, cigarettes, uncomplicated: Secondary | ICD-10-CM | POA: Diagnosis present

## 2017-09-13 DIAGNOSIS — T401X1A Poisoning by heroin, accidental (unintentional), initial encounter: Secondary | ICD-10-CM | POA: Diagnosis present

## 2017-09-13 DIAGNOSIS — Z7151 Drug abuse counseling and surveillance of drug abuser: Secondary | ICD-10-CM | POA: Diagnosis not present

## 2017-09-13 DIAGNOSIS — G894 Chronic pain syndrome: Secondary | ICD-10-CM | POA: Diagnosis present

## 2017-09-13 DIAGNOSIS — F1410 Cocaine abuse, uncomplicated: Secondary | ICD-10-CM | POA: Diagnosis present

## 2017-09-13 DIAGNOSIS — D649 Anemia, unspecified: Secondary | ICD-10-CM | POA: Diagnosis not present

## 2017-09-13 DIAGNOSIS — R0681 Apnea, not elsewhere classified: Secondary | ICD-10-CM | POA: Diagnosis present

## 2017-09-13 DIAGNOSIS — K922 Gastrointestinal hemorrhage, unspecified: Secondary | ICD-10-CM | POA: Diagnosis present

## 2017-09-13 DIAGNOSIS — Z8249 Family history of ischemic heart disease and other diseases of the circulatory system: Secondary | ICD-10-CM | POA: Diagnosis not present

## 2017-09-13 DIAGNOSIS — R0689 Other abnormalities of breathing: Secondary | ICD-10-CM | POA: Diagnosis not present

## 2017-09-13 DIAGNOSIS — Z791 Long term (current) use of non-steroidal anti-inflammatories (NSAID): Secondary | ICD-10-CM | POA: Diagnosis not present

## 2017-09-13 DIAGNOSIS — K2971 Gastritis, unspecified, with bleeding: Secondary | ICD-10-CM | POA: Diagnosis not present

## 2017-09-13 DIAGNOSIS — D62 Acute posthemorrhagic anemia: Secondary | ICD-10-CM | POA: Diagnosis present

## 2017-09-13 DIAGNOSIS — R1013 Epigastric pain: Secondary | ICD-10-CM | POA: Diagnosis present

## 2017-09-13 DIAGNOSIS — Z833 Family history of diabetes mellitus: Secondary | ICD-10-CM | POA: Diagnosis not present

## 2017-09-13 DIAGNOSIS — Y92009 Unspecified place in unspecified non-institutional (private) residence as the place of occurrence of the external cause: Secondary | ICD-10-CM | POA: Diagnosis not present

## 2017-09-13 DIAGNOSIS — Z9884 Bariatric surgery status: Secondary | ICD-10-CM | POA: Diagnosis not present

## 2017-09-13 DIAGNOSIS — Z888 Allergy status to other drugs, medicaments and biological substances status: Secondary | ICD-10-CM | POA: Diagnosis not present

## 2017-09-13 DIAGNOSIS — M549 Dorsalgia, unspecified: Secondary | ICD-10-CM | POA: Diagnosis present

## 2017-09-13 DIAGNOSIS — Z981 Arthrodesis status: Secondary | ICD-10-CM | POA: Diagnosis not present

## 2017-09-13 DIAGNOSIS — Z79891 Long term (current) use of opiate analgesic: Secondary | ICD-10-CM | POA: Diagnosis not present

## 2017-09-13 DIAGNOSIS — E876 Hypokalemia: Secondary | ICD-10-CM | POA: Diagnosis present

## 2017-09-13 DIAGNOSIS — Z6826 Body mass index (BMI) 26.0-26.9, adult: Secondary | ICD-10-CM | POA: Diagnosis not present

## 2017-09-13 DIAGNOSIS — I10 Essential (primary) hypertension: Secondary | ICD-10-CM | POA: Diagnosis present

## 2017-09-13 DIAGNOSIS — F319 Bipolar disorder, unspecified: Secondary | ICD-10-CM | POA: Diagnosis present

## 2017-09-13 DIAGNOSIS — D509 Iron deficiency anemia, unspecified: Secondary | ICD-10-CM | POA: Diagnosis present

## 2017-09-13 DIAGNOSIS — Z9049 Acquired absence of other specified parts of digestive tract: Secondary | ICD-10-CM | POA: Diagnosis not present

## 2017-09-13 DIAGNOSIS — E440 Moderate protein-calorie malnutrition: Secondary | ICD-10-CM | POA: Diagnosis present

## 2017-09-13 HISTORY — DX: Gastrointestinal hemorrhage, unspecified: K92.2

## 2017-09-13 LAB — CBC
HEMATOCRIT: 25.4 % — AB (ref 36.0–46.0)
HEMATOCRIT: 22.8 % — AB (ref 36.0–46.0)
Hemoglobin: 7.8 g/dL — ABNORMAL LOW (ref 12.0–15.0)
Hemoglobin: 7.2 g/dL — ABNORMAL LOW (ref 12.0–15.0)
MCH: 20.5 pg — AB (ref 26.0–34.0)
MCH: 20.5 pg — ABNORMAL LOW (ref 26.0–34.0)
MCHC: 30.7 g/dL (ref 30.0–36.0)
MCHC: 31.6 g/dL (ref 30.0–36.0)
MCV: 66.7 fL — AB (ref 78.0–100.0)
MCV: 64.8 fL — AB (ref 78.0–100.0)
PLATELETS: 250 10*3/uL (ref 150–400)
Platelets: 238 10*3/uL (ref 150–400)
RBC: 3.81 MIL/uL — ABNORMAL LOW (ref 3.87–5.11)
RBC: 3.52 MIL/uL — AB (ref 3.87–5.11)
RDW: 21.6 % — AB (ref 11.5–15.5)
RDW: 21.6 % — ABNORMAL HIGH (ref 11.5–15.5)
WBC: 6.4 10*3/uL (ref 4.0–10.5)
WBC: 5.8 10*3/uL (ref 4.0–10.5)

## 2017-09-13 LAB — RETICULOCYTES
RBC.: 3.81 MIL/uL — ABNORMAL LOW (ref 3.87–5.11)
RETIC COUNT ABSOLUTE: 45.7 10*3/uL (ref 19.0–186.0)
Retic Ct Pct: 1.2 % (ref 0.4–3.1)

## 2017-09-13 LAB — CBC WITH DIFFERENTIAL/PLATELET
BASOS ABS: 0.1 10*3/uL (ref 0.0–0.1)
Basophils Relative: 1 %
EOS ABS: 0.2 10*3/uL (ref 0.0–0.7)
Eosinophils Relative: 2 %
HCT: 23.6 % — ABNORMAL LOW (ref 36.0–46.0)
HEMOGLOBIN: 7.3 g/dL — AB (ref 12.0–15.0)
LYMPHS PCT: 38 %
Lymphs Abs: 3 10*3/uL (ref 0.7–4.0)
MCH: 20.1 pg — AB (ref 26.0–34.0)
MCHC: 30.9 g/dL (ref 30.0–36.0)
MCV: 64.8 fL — ABNORMAL LOW (ref 78.0–100.0)
Monocytes Absolute: 0.3 10*3/uL (ref 0.1–1.0)
Monocytes Relative: 4 %
NEUTROS ABS: 4.3 10*3/uL (ref 1.7–7.7)
Neutrophils Relative %: 55 %
Platelets: 257 10*3/uL (ref 150–400)
RBC: 3.64 MIL/uL — ABNORMAL LOW (ref 3.87–5.11)
RDW: 21.7 % — ABNORMAL HIGH (ref 11.5–15.5)
WBC: 7.9 10*3/uL (ref 4.0–10.5)

## 2017-09-13 LAB — COMPREHENSIVE METABOLIC PANEL
ALT: <5 U/L — ABNORMAL LOW (ref 14–54)
AST: 16 U/L (ref 15–41)
Albumin: 3 g/dL — ABNORMAL LOW (ref 3.5–5.0)
Alkaline Phosphatase: 94 U/L (ref 38–126)
Anion gap: 10 (ref 5–15)
BUN: 9 mg/dL (ref 6–20)
CHLORIDE: 105 mmol/L (ref 101–111)
CO2: 26 mmol/L (ref 22–32)
Calcium: 8.1 mg/dL — ABNORMAL LOW (ref 8.9–10.3)
Creatinine, Ser: 0.99 mg/dL (ref 0.44–1.00)
Glucose, Bld: 83 mg/dL (ref 65–99)
POTASSIUM: 2.7 mmol/L — AB (ref 3.5–5.1)
Sodium: 141 mmol/L (ref 135–145)
Total Bilirubin: 0.4 mg/dL (ref 0.3–1.2)
Total Protein: 6 g/dL — ABNORMAL LOW (ref 6.5–8.1)

## 2017-09-13 LAB — FOLATE: FOLATE: 10.2 ng/mL (ref >5.9)

## 2017-09-13 LAB — FERRITIN: Ferritin: 4 ng/mL — ABNORMAL LOW (ref 11–307)

## 2017-09-13 LAB — POC OCCULT BLOOD, ED
FECAL OCCULT BLD: NEGATIVE
Fecal Occult Bld: POSITIVE — AB

## 2017-09-13 LAB — MAGNESIUM: Magnesium: 2 mg/dL (ref 1.7–2.4)

## 2017-09-13 LAB — RAPID URINE DRUG SCREEN, HOSP PERFORMED
AMPHETAMINES: NOT DETECTED
BENZODIAZEPINES: POSITIVE — AB
Barbiturates: NOT DETECTED
Cocaine: POSITIVE — AB
Opiates: POSITIVE — AB
Tetrahydrocannabinol: POSITIVE — AB

## 2017-09-13 LAB — VITAMIN B12: Vitamin B-12: 154 pg/mL — ABNORMAL LOW (ref 180–914)

## 2017-09-13 LAB — ACETAMINOPHEN LEVEL

## 2017-09-13 LAB — PROTIME-INR
INR: 1
Prothrombin Time: 13.1 seconds (ref 11.4–15.2)

## 2017-09-13 LAB — SALICYLATE LEVEL

## 2017-09-13 LAB — ABO/RH: ABO/RH(D): B POS

## 2017-09-13 LAB — ETHANOL

## 2017-09-13 LAB — APTT: APTT: 26 s (ref 24–36)

## 2017-09-13 LAB — HCG, QUANTITATIVE, PREGNANCY

## 2017-09-13 MED ORDER — POTASSIUM CHLORIDE CRYS ER 20 MEQ PO TBCR
40.0000 meq | EXTENDED_RELEASE_TABLET | Freq: Once | ORAL | Status: AC
  Administered 2017-09-13: 40 meq via ORAL
  Filled 2017-09-13: qty 2

## 2017-09-13 MED ORDER — PANTOPRAZOLE SODIUM 40 MG IV SOLR
80.0000 mg | Freq: Once | INTRAVENOUS | Status: AC
  Administered 2017-09-13: 80 mg via INTRAVENOUS
  Filled 2017-09-13: qty 80

## 2017-09-13 MED ORDER — AMITRIPTYLINE HCL 25 MG PO TABS
50.0000 mg | ORAL_TABLET | Freq: Every day | ORAL | Status: DC
  Administered 2017-09-13 – 2017-09-14 (×2): 50 mg via ORAL
  Filled 2017-09-13 (×2): qty 2

## 2017-09-13 MED ORDER — IOPAMIDOL (ISOVUE-300) INJECTION 61%
INTRAVENOUS | Status: AC
  Administered 2017-09-13: 100 mL
  Filled 2017-09-13: qty 100

## 2017-09-13 MED ORDER — OXYCODONE-ACETAMINOPHEN 5-325 MG PO TABS: 1.0000 | ORAL_TABLET | Freq: Two times a day (BID) | ORAL | Status: DC | PRN

## 2017-09-13 MED ORDER — OXYCODONE-ACETAMINOPHEN 5-325 MG PO TABS
1.0000 | ORAL_TABLET | Freq: Two times a day (BID) | ORAL | Status: DC | PRN
  Administered 2017-09-13: 1 via ORAL
  Filled 2017-09-13: qty 1

## 2017-09-13 MED ORDER — ENOXAPARIN SODIUM 40 MG/0.4ML ~~LOC~~ SOLN
40.0000 mg | SUBCUTANEOUS | Status: DC
  Filled 2017-09-13: qty 0.4

## 2017-09-13 MED ORDER — MORPHINE SULFATE (PF) 2 MG/ML IV SOLN
2.0000 mg | Freq: Once | INTRAVENOUS | Status: AC
  Administered 2017-09-13: 2 mg via INTRAVENOUS

## 2017-09-13 MED ORDER — SODIUM CHLORIDE 0.9 % IV SOLN
INTRAVENOUS | Status: DC
  Administered 2017-09-13 – 2017-09-14 (×4): via INTRAVENOUS

## 2017-09-13 MED ORDER — POLYETHYLENE GLYCOL 3350 17 G PO PACK
17.0000 g | PACK | Freq: Every day | ORAL | Status: DC
  Administered 2017-09-13: 17 g via ORAL
  Filled 2017-09-13 (×2): qty 1

## 2017-09-13 MED ORDER — LISINOPRIL-HYDROCHLOROTHIAZIDE 10-12.5 MG PO TABS: 1.0000 | ORAL_TABLET | Freq: Every day | ORAL | Status: DC

## 2017-09-13 MED ORDER — LISINOPRIL 10 MG PO TABS
10.0000 mg | ORAL_TABLET | Freq: Every day | ORAL | Status: DC
  Administered 2017-09-13 – 2017-09-15 (×3): 10 mg via ORAL
  Filled 2017-09-13 (×3): qty 1

## 2017-09-13 MED ORDER — OXYCODONE-ACETAMINOPHEN 5-325 MG PO TABS
1.0000 | ORAL_TABLET | Freq: Four times a day (QID) | ORAL | Status: AC | PRN
  Administered 2017-09-13 – 2017-09-14 (×2): 1 via ORAL
  Filled 2017-09-13 (×2): qty 1

## 2017-09-13 MED ORDER — ALBUTEROL SULFATE (2.5 MG/3ML) 0.083% IN NEBU: 2.5000 mg | INHALATION_SOLUTION | RESPIRATORY_TRACT | Status: DC | PRN

## 2017-09-13 MED ORDER — DOCUSATE SODIUM 100 MG PO CAPS
100.0000 mg | ORAL_CAPSULE | Freq: Two times a day (BID) | ORAL | Status: DC
  Administered 2017-09-14: 100 mg via ORAL
  Filled 2017-09-13 (×3): qty 1

## 2017-09-13 MED ORDER — AMLODIPINE BESYLATE 5 MG PO TABS
5.0000 mg | ORAL_TABLET | Freq: Every day | ORAL | Status: DC
  Administered 2017-09-13 – 2017-09-15 (×3): 5 mg via ORAL
  Filled 2017-09-13 (×4): qty 1

## 2017-09-13 MED ORDER — TRAMADOL HCL 50 MG PO TABS: 50.0000 mg | ORAL_TABLET | Freq: Two times a day (BID) | ORAL | Status: DC | PRN

## 2017-09-13 MED ORDER — POTASSIUM CHLORIDE 10 MEQ/100ML IV SOLN
10.0000 meq | INTRAVENOUS | Status: AC
  Administered 2017-09-13 (×4): 10 meq via INTRAVENOUS
  Filled 2017-09-13 (×3): qty 100

## 2017-09-13 MED ORDER — MAGNESIUM SULFATE 2 GM/50ML IV SOLN
2.0000 g | Freq: Once | INTRAVENOUS | Status: AC
  Administered 2017-09-13: 2 g via INTRAVENOUS
  Filled 2017-09-13: qty 50

## 2017-09-13 MED ORDER — DULOXETINE HCL 30 MG PO CPEP
60.0000 mg | ORAL_CAPSULE | Freq: Every day | ORAL | Status: DC
  Administered 2017-09-13 – 2017-09-14 (×2): 60 mg via ORAL
  Filled 2017-09-13 (×2): qty 2
  Filled 2017-09-13: qty 1

## 2017-09-13 MED ORDER — SODIUM CHLORIDE 0.9 % IV SOLN
8.0000 mg/h | INTRAVENOUS | Status: DC
  Administered 2017-09-13 – 2017-09-14 (×3): 8 mg/h via INTRAVENOUS
  Filled 2017-09-13: qty 40
  Filled 2017-09-13 (×3): qty 80

## 2017-09-13 MED ORDER — NALOXONE HCL 0.4 MG/ML IJ SOLN: 0.4000 mg | INTRAMUSCULAR | Status: DC | PRN

## 2017-09-13 MED ORDER — POTASSIUM CHLORIDE 20 MEQ/15ML (10%) PO SOLN
40.0000 meq | Freq: Once | ORAL | Status: AC
  Administered 2017-09-13: 40 meq via ORAL
  Filled 2017-09-13: qty 30

## 2017-09-13 MED ORDER — PANTOPRAZOLE SODIUM 40 MG IV SOLR: 40.0000 mg | Freq: Two times a day (BID) | INTRAVENOUS | Status: DC

## 2017-09-13 MED ORDER — GI COCKTAIL ~~LOC~~
30.0000 mL | Freq: Two times a day (BID) | ORAL | Status: DC | PRN
  Administered 2017-09-14 (×2): 30 mL via ORAL
  Filled 2017-09-13 (×3): qty 30

## 2017-09-13 MED ORDER — MORPHINE SULFATE (PF) 2 MG/ML IV SOLN
2.0000 mg | INTRAVENOUS | Status: DC | PRN
  Administered 2017-09-13: 2 mg via INTRAVENOUS
  Filled 2017-09-13: qty 1

## 2017-09-13 MED ORDER — FAMOTIDINE IN NACL 20-0.9 MG/50ML-% IV SOLN
20.0000 mg | Freq: Two times a day (BID) | INTRAVENOUS | Status: DC
  Administered 2017-09-13 – 2017-09-14 (×3): 20 mg via INTRAVENOUS
  Filled 2017-09-13 (×3): qty 50

## 2017-09-13 MED ORDER — HYDROCHLOROTHIAZIDE 12.5 MG PO CAPS
12.5000 mg | ORAL_CAPSULE | Freq: Every day | ORAL | Status: DC
  Administered 2017-09-13 – 2017-09-14 (×2): 12.5 mg via ORAL
  Filled 2017-09-13 (×2): qty 1

## 2017-09-13 MED ORDER — FAMOTIDINE IN NACL 20-0.9 MG/50ML-% IV SOLN
20.0000 mg | Freq: Once | INTRAVENOUS | Status: AC
  Administered 2017-09-13: 20 mg via INTRAVENOUS
  Filled 2017-09-13: qty 50

## 2017-09-13 MED ORDER — SODIUM CHLORIDE 0.9 % IV BOLUS
1000.0000 mL | Freq: Once | INTRAVENOUS | Status: AC
  Administered 2017-09-13: 1000 mL via INTRAVENOUS

## 2017-09-13 MED ORDER — MORPHINE SULFATE (PF) 2 MG/ML IV SOLN
INTRAVENOUS | Status: AC
  Administered 2017-09-13: 2 mg via INTRAVENOUS
  Filled 2017-09-13: qty 1

## 2017-09-13 NOTE — ED Provider Notes (Addendum)
[facility] DEPT Provider Note   CSN: [id number] Arrival date & time: 09/12/17  2257     History   Chief Complaint Chief Complaint  Patient presents with  . heroin overdose    HPI Terri [name] is a 42 y.o. female.  HPI 42 year old female comes in with chief complaint of heroin overdose. Level 5 caveat for altered mental status.  Per nursing report, EMS was called by patient's daughter because patient stopped breathing on 2 or 3 occasions.  EMS reported that patient was extremely unstable in the assessor.  No Narcan was given on the field.  Patient is arousable with painful stimuli, but she falls asleep immediately and is unable to give meaningful history.  Past Medical History:  Diagnosis Date  . Bipolar disorder ([place])   . Chronic back pain   . Hypertension   . Migraine headache   . Sciatica   . Stress incontinence     Patient Active Problem List   Diagnosis Date Noted  . Essential hypertension   . Cellulitis and abscess of neck 08/12/2017  . Cellulitis of submandibular region 08/10/2017  . Odontogenic infection of jaw 08/10/2017  . Cholecystitis 02/14/2017  . Opioid abuse with opioid-induced mood disorder ([place]) 01/25/2017  . Anxiety 02/11/2014  . Depression 07/12/2012    Past Surgical History:  Procedure Laterality Date  . ANKLE SURGERY    . BACK SURGERY    . CESAREAN SECTION    . CHOLECYSTECTOMY N/A 02/15/2017   Procedure: LAPAROSCOPIC CHOLECYSTECTOMY;  Surgeon: Clovis Riley, MD;  Location: [facility];  Service: General;  Laterality: N/A;  . FRACTURE SURGERY    . GASTRIC BYPASS    . INCISION AND DRAINAGE OF PERITONSILLAR ABCESS Left 08/13/2017   Procedure: INCISION AND DRAINAGE OF LEFT NECK ABSCESS;  Surgeon: Melida Quitter, MD;  Location: [facility];  Service: ENT;  Laterality: Left;  . LAPAROSCOPIC LYSIS OF ADHESIONS  02/15/2017   Procedure: LAPAROSCOPIC LYSIS OF ADHESIONS;  Surgeon: Clovis Riley, MD;  Location[facility];   Service: General;;  . LUMBAR [place]    . LUMBAR FUSION    . TUBAL LIGATION    . UPPER GI ENDOSCOPY N/A 02/15/2017   Procedure: UPPER GI ENDOSCOPY;  Surgeon: Clovis Riley, MD;  [facility];  Service: General;  Laterality: N/A;     OB History   None      Home Medications    Prior to Admission medications   Medication Sig Start Date End Date Taking? Authorizing Provider  acetaminophen (TYLENOL) 500 MG tablet Take 1,000 mg by mouth every 6 (six) hours as needed for moderate pain.   Yes [provider]  albuterol (PROVENTIL HFA;VENTOLIN HFA) 108 (90 BASE) MCG/ACT inhaler Inhale 1-2 puffs into the lungs every 4 (four) hours as needed for wheezing or shortness of breath. 03/07/15  Yes Pisciotta, Elmyra Ricks, PA-C  amitriptyline (ELAVIL) 50 MG tablet Take 50 mg by mouth at bedtime. 08/24/17  Yes [provider]  amLODipine (NORVASC) 5 MG tablet Take 5 mg by mouth daily. 08/25/17  Yes [provider]  DULoxetine (CYMBALTA) 60 MG capsule Take 60 mg by mouth daily. 12/23/14  Yes [provider]  gabapentin (NEURONTIN) 300 MG capsule Take 300 mg by mouth 3 (three) times daily. 08/27/17  Yes [provider]  ibuprofen (ADVIL,MOTRIN) 200 MG tablet Take 2 tablets (400 mg total) by mouth every 6 (six) hours as needed for moderate pain. 08/19/17  Yes Rosita Fire,  MD  lisinopril-hydrochlorothiazide (PRINZIDE,ZESTORETIC) 10-12.5 MG tablet Take 1 tablet by mouth daily. 01/22/17  Yes [provider]  ondansetron (ZOFRAN-ODT) 8 MG disintegrating tablet Take 8 mg by mouth every 8 (eight) hours as needed for nausea or vomiting.  08/20/17  Yes [provider]  oxyCODONE-acetaminophen (PERCOCET/ROXICET) 5-325 MG tablet Take 1 tablet by mouth 2 (two) times daily as needed for pain. 08/30/17  Yes [provider]  pantoprazole (PROTONIX) 40 MG tablet Take 40 mg by mouth 2 (two) times daily. 12/16/16  Yes [provider]  docusate  sodium (COLACE) 100 MG capsule Take 1 capsule (100 mg total) by mouth 2 (two) times daily. Patient not taking: Reported on 09/08/2017 08/19/17   Rosita Fire, MD  polyethylene glycol [facility] / Floria Raveling) packet Take 17 g by mouth daily as needed. Patient not taking: Reported on 09/08/2017 08/19/17   Rosita Fire, MD  tiZANidine (ZANAFLEX) 4 MG tablet Take 1 tablet (4 mg total) by mouth every 8 (eight) hours as needed for muscle spasms. Patient not taking: Reported on 09/08/2017 08/19/17   Rosita Fire, MD    Family History Family History  Problem Relation Age of Onset  . Diabetes Mother   . Hypertension Mother   . Diabetes Father   . Hypertension Father     Social History Social History   Tobacco Use  . Smoking status: Current Every Day Smoker    Packs/day: 1.00    Types: Cigarettes  . Smokeless tobacco: Never Used  Substance Use Topics  . Alcohol use: No  . Drug use: Yes    Types: IV    Comment: Heroin     Allergies   Ketorolac tromethamine and Methocarbamol   Review of Systems Review of Systems  Unable to perform ROS: Patient unresponsive     Physical Exam Updated Vital Signs BP (!) 131/99   Pulse 69   Resp (!) 29   Ht 5\' 10"  (1.778 m)   Wt 84.4 kg (186 lb)   LMP 09/01/2017   SpO2 96%   BMI 26.69 kg/m   Physical Exam  Constitutional: She appears well-developed.  HENT:  Head: Atraumatic.  Eyes:  Pupils are 2 mm and equal  Neck: Neck supple.  Cardiovascular: Normal rate.  Pulmonary/Chest: Effort normal.  Respiratory rate at 8  Abdominal: Soft.  Neurological:  Obtunded -arousable to painful stimuli  Skin: Skin is warm and dry.  Nursing note and vitals reviewed.    ED Treatments / Results  Labs (all labs ordered are listed, but only abnormal results are displayed) Labs Reviewed  RAPID URINE DRUG SCREEN, HOSP PERFORMED - Abnormal; Notable for the following components:      Result Value   Opiates POSITIVE (*)    Cocaine  POSITIVE (*)    Benzodiazepines POSITIVE (*)    Tetrahydrocannabinol POSITIVE (*)    All other components within normal limits  COMPREHENSIVE METABOLIC PANEL - Abnormal; Notable for the following components:   Potassium 2.7 (*)    Calcium 8.1 (*)    Total Protein 6.0 (*)    Albumin 3.0 (*)    ALT <5 (*)    All other components within normal limits  CBC WITH DIFFERENTIAL/PLATELET - Abnormal; Notable for the following components:   RBC 3.64 (*)    Hemoglobin 7.3 (*)    HCT 23.6 (*)    MCV 64.8 (*)    MCH 20.1 (*)    RDW 21.7 (*)    All other components within  normal limits  ACETAMINOPHEN LEVEL - Abnormal; Notable for the following components:   Acetaminophen (Tylenol), Serum <10 (*)    All other components within normal limits  POC OCCULT BLOOD, ED - Abnormal; Notable for the following components:   Fecal Occult Bld POSITIVE (*)    All other components within normal limits  SALICYLATE LEVEL  ETHANOL  MAGNESIUM  VITAMIN B12  FOLATE  IRON AND TIBC  FERRITIN  RETICULOCYTES  POC OCCULT BLOOD, ED  TYPE AND SCREEN  ABO/RH    EKG None  Radiology No results found.  Procedures Procedures (including critical care time)  CRITICAL CARE Performed by: Demri Poulton   Total critical care time: 58 minutes  Critical care time was exclusive of separately billable procedures and treating other patients.  Critical care was necessary to treat or prevent imminent or life-threatening deterioration.  Critical care was time spent personally by me on the following activities: development of treatment plan with patient and/or surrogate as well as nursing, discussions with consultants, evaluation of patient's response to treatment, examination of patient, obtaining history from patient or surrogate, ordering and performing treatments and interventions, ordering and review of laboratory studies, ordering and review of radiographic studies, pulse oximetry and re-evaluation of patient's  condition.   Medications Ordered in ED Medications  potassium chloride 10 mEq in 100 mL IVPB (0 mEq Intravenous Stopped 09/13/17 0408)  naloxone [facility]) injection 0.4 mg (0.4 mg Intravenous Given 09/12/17 2332)  famotidine (PEPCID) IVPB 20 mg premix (0 mg Intravenous Stopped 09/13/17 0311)     Initial Impression / Assessment and Plan / ED Course  I have reviewed the triage vital signs and the nursing notes.  Pertinent labs & imaging results that were available during my care of the patient were reviewed by me and considered in my medical decision making (see chart for details).  Clinical Course as of Sep 13 532  Tue Sep 13, 2017  0206 Patient still arousable to noxious stimuli.    [AN]  0206 K is less than 2.7.  We will start with IV potassium.  Mag Level added.  Potassium(!!): 2.7 [AN]  0206 Hemoglobin today 7.3. Hemoglobin is trending down.  We will get type and screen and a fecal occult stool.  Pepcid IV also ordered.     Hemoglobin(!): 7.3 [AN]    Clinical Course User Index [AN] Varney Biles, MD    42 year old female comes in with altered mental status.  Patient allegedly had respiratory depression at home and there is history of opiate overdose.  Given patient's respiratory depression in the ER with altered sensorium, I do suspect that this is probably overdose.  No signs of trauma on the head, and patient is arousable with noxious stimuli.  0.4 of Narcan IV given in the ED, and patient's respiratory rate has improved from 9-16.  Patient is still drowsy.  End-tidal CO2 monitor applied and we will monitor the patient closely.  At this time she is protecting her airway.  Final Clinical Impressions(s) / ED Diagnoses   Final diagnoses:  Opioid overdose, accidental or unintentional, initial encounter [facility])  Narcotic-induced respiratory depression    ED Discharge Orders    None       Varney Biles, MD 09/13/17 [place]    Varney Biles, MD 09/13/17 [id number]

## 2017-09-13 NOTE — ED Notes (Signed)
Spoke with AC. AC stated that we will get her a bed in [facility] and continue care within the hour once patient is transferred to [facility]

## 2017-09-13 NOTE — ED Notes (Signed)
Primary RN Catalina Antigua has been made aware of situation and is in room talking with pt.

## 2017-09-13 NOTE — Consult Note (Signed)
[facility] Nurse wound consult note Reason for Consult: full thickness wound on right hand Wound type: Traumatic, chronic Pressure Injury POA: NA Measurement: 2cm x 1cm x 0.4cm Wound bed: Dry, 80% red, 20% dark purple center area Drainage (amount, consistency, odor) None Periwound:Intact, dry Dressing procedure/placement/frequency: I will use an antimicrobial topical dressing that will lend moisture rather than absorb it, since wound bed is desiccated.  Xeroform gauze will be changed twice daily and secured with a dry wrap/paper tape.  [facility] nursing team will not follow, but will remain available to this patient, the nursing and medical teams.  Please re-consult if needed. Thanks, Maudie Flakes, MSN, RN, [place], Arther Abbott  Pager# [phone]

## 2017-09-13 NOTE — ED Notes (Signed)
PATIENT DEMANDED TO BE DISCHARGED! WHILE TRYING TO OBTAIN DISCHARGE VITALS PATIENT REFUSED TO HAVE THEM DONE AND STARTED PULLING HER IV'S OUT HER SELF AND PULLING OF HEART MONITORS. AFTER ALL IV AND MONITOR'S HAD BEEN PULLED OFF BY PATIENT SHE STATED SHE WAS NOT LEAVING HOSPITAL BUT WE WERE NOT GOING TO GIVE HER IV'S OR HAVE HER ON MONITORS

## 2017-09-13 NOTE — Consult Note (Signed)
Reason for Consult:guaiac positive anemia generalized abdominal pain Referring Physician: Hospital team  Terri [name] is an 42 y.o. female.  HPI: patient seen and examined and hospital computer chart reviewed and case discussed with the hospital team and she has had a gastric bypass and has been on lots of aspirin and nonsteroidals but has not seen any blood in her bowels but still has her periods and was not aware she was anemic and her family history is negative from a GI standpoint and she says protonix  helps her stomach and she will have some periodic diarrhea and has been losing some weight and has nausea but no vomiting and has no other complaints  Past Medical History:  Diagnosis Date  . Bipolar disorder ([employer])   . Chronic back pain   . Hypertension   . Migraine headache   . Sciatica   . Stress incontinence     Past Surgical History:  Procedure Laterality Date  . ANKLE SURGERY    . BACK SURGERY    . CESAREAN SECTION    . CHOLECYSTECTOMY N/A 02/15/2017   Procedure: LAPAROSCOPIC CHOLECYSTECTOMY;  Surgeon: Clovis Riley, MD;  [facility];  Service: General;  Laterality: N/A;  . FRACTURE SURGERY    . GASTRIC BYPASS    . INCISION AND DRAINAGE OF PERITONSILLAR ABCESS Left 08/13/2017   Procedure: INCISION AND DRAINAGE OF LEFT NECK ABSCESS;  Surgeon: Melida Quitter, MD;  [facility];  Service: ENT;  Laterality: Left;  . LAPAROSCOPIC LYSIS OF ADHESIONS  02/15/2017   Procedure: LAPAROSCOPIC LYSIS OF ADHESIONS;  Surgeon: Clovis Riley, MD;  [facility];  Service: General;;  . LUMBAR [place]    . LUMBAR FUSION    . TUBAL LIGATION    . UPPER GI ENDOSCOPY N/A 02/15/2017   Procedure: UPPER GI ENDOSCOPY;  Surgeon: Clovis Riley, MD;  [facility];  Service: General;  Laterality: N/A;    Family History  Problem Relation Age of Onset  . Diabetes Mother   . Hypertension Mother   . Diabetes Father   . Hypertension Father     Social History:  reports that  she has been smoking cigarettes.  She has been smoking about 1.00 pack per day. She has never used smokeless tobacco. She reports that she has current or past drug history. Drug: IV. She reports that she does not drink alcohol.  Allergies:  Allergies  Allergen Reactions  . Ketorolac Tromethamine Itching and Nausea And Vomiting  . Methocarbamol Diarrhea    Medications: I have reviewed the patient's current medications.  Results for orders placed or performed during the hospital encounter of 09/12/17 (from the past 48 hour(s))  Urine rapid drug screen (hosp performed)     Status: Abnormal   Collection Time: 09/12/17 11:31 PM  Result Value Ref Range   Opiates POSITIVE (A) NONE DETECTED   Cocaine POSITIVE (A) NONE DETECTED   Benzodiazepines POSITIVE (A) NONE DETECTED   Amphetamines NONE DETECTED NONE DETECTED   Tetrahydrocannabinol POSITIVE (A) NONE DETECTED   Barbiturates NONE DETECTED NONE DETECTED    Comment: (NOTE) DRUG SCREEN FOR MEDICAL PURPOSES ONLY.  IF CONFIRMATION IS NEEDED FOR ANY PURPOSE, NOTIFY LAB WITHIN 5 DAYS. LOWEST DETECTABLE LIMITS FOR URINE DRUG SCREEN Drug Class                     Cutoff (ng/mL) Amphetamine and metabolites    1000 Barbiturate and metabolites    200 Benzodiazepine  992 Tricyclics and metabolites     300 Opiates and metabolites        300 Cocaine and metabolites        300 THC                            50 Performed at [facility], [place] [address] [place], [place] [address]   Salicylate level     Status: None   Collection Time: 09/12/17 11:31 PM  Result Value Ref Range   Salicylate Lvl <4.1 2.8 - 30.0 mg/dL    Comment: Performed at [facility], [place] [address], [place], [place] [address]  Comprehensive metabolic panel     Status: Abnormal   Collection Time: 09/12/17 11:31 PM  Result Value Ref Range   Sodium 141 135 - 145 mmol/L   Potassium 2.7 (LL) 3.5 - 5.1 mmol/L    Comment:  CRITICAL RESULT CALLED TO, READ BACK BY AND VERIFIED WITH: Amalia Greenhouse RN [phone] 09/13/17 A NAVARRO    Chloride 105 101 - 111 mmol/L   CO2 26 22 - 32 mmol/L   Glucose, Bld 83 65 - 99 mg/dL   BUN 9 6 - 20 mg/dL   Creatinine, Ser 0.99 0.44 - 1.00 mg/dL   Calcium 8.1 (L) 8.9 - 10.3 mg/dL   Total Protein 6.0 (L) 6.5 - 8.1 g/dL   Albumin 3.0 (L) 3.5 - 5.0 g/dL   AST 16 15 - 41 U/L   ALT <5 (L) 14 - 54 U/L   Alkaline Phosphatase 94 38 - 126 U/L   Total Bilirubin 0.4 0.3 - 1.2 mg/dL   GFR calc non Af Amer >60 >60 mL/min   GFR calc Af Amer >60 >60 mL/min    Comment: (NOTE) The eGFR has been calculated using the CKD EPI equation. This calculation has not been validated in all clinical situations. eGFR's persistently <60 mL/min signify possible Chronic Kidney Disease.    Anion gap 10 5 - 15    Comment: Performed at [facility], [place] [address], [place], [place] [address]  CBC with Differential     Status: Abnormal   Collection Time: 09/12/17 11:31 PM  Result Value Ref Range   WBC 7.9 4.0 - 10.5 K/uL   RBC 3.64 (L) 3.87 - 5.11 MIL/uL   Hemoglobin 7.3 (L) 12.0 - 15.0 g/dL   HCT 23.6 (L) 36.0 - 46.0 %   MCV 64.8 (L) 78.0 - 100.0 fL   MCH 20.1 (L) 26.0 - 34.0 pg   MCHC 30.9 30.0 - 36.0 g/dL   RDW 21.7 (H) 11.5 - 15.5 %   Platelets 257 150 - 400 K/uL   Neutrophils Relative % 55 %   Lymphocytes Relative 38 %   Monocytes Relative 4 %   Eosinophils Relative 2 %   Basophils Relative 1 %   Neutro Abs 4.3 1.7 - 7.7 K/uL   Lymphs Abs 3.0 0.7 - 4.0 K/uL   Monocytes Absolute 0.3 0.1 - 1.0 K/uL   Eosinophils Absolute 0.2 0.0 - 0.7 K/uL   Basophils Absolute 0.1 0.0 - 0.1 K/uL   RBC Morphology TARGET CELLS     Comment: POLYCHROMASIA PRESENT Performed at [facility], [place] [address] [place], [place] [address]   Ethanol     Status: None   Collection Time: 09/12/17 11:31 PM  Result Value Ref Range   Alcohol, Ethyl (B) <10 <10 mg/dL  Comment:         LOWEST DETECTABLE LIMIT FOR SERUM ALCOHOL IS 10 mg/dL FOR MEDICAL PURPOSES ONLY Performed at [place] [address], [place], [place] [address]   Acetaminophen level     Status: Abnormal   Collection Time: 09/12/17 11:31 PM  Result Value Ref Range   Acetaminophen (Tylenol), Serum <10 (L) 10 - 30 ug/mL    Comment:        THERAPEUTIC CONCENTRATIONS VARY SIGNIFICANTLY. A RANGE OF 10-30 ug/mL MAY BE AN EFFECTIVE CONCENTRATION FOR MANY PATIENTS. HOWEVER, SOME ARE BEST TREATED AT CONCENTRATIONS OUTSIDE THIS RANGE. ACETAMINOPHEN CONCENTRATIONS >150 ug/mL AT 4 HOURS AFTER INGESTION AND >50 ug/mL AT 12 HOURS AFTER INGESTION ARE OFTEN ASSOCIATED WITH TOXIC REACTIONS. Performed at [facility], [place] [address], [place], [place] [address]   POC occult blood, ED RN will collect     Status: None   Collection Time: 09/13/17  2:37 AM  Result Value Ref Range   Fecal Occult Bld NEGATIVE NEGATIVE  Type and screen     Status: None   Collection Time: 09/13/17  2:45 AM  Result Value Ref Range   ABO/RH(D) B POS    Antibody Screen NEG    Sample Expiration      09/16/2017 Performed at [facility], [place] [address] [place], [place] [address]   Magnesium     Status: None   Collection Time: 09/13/17  2:45 AM  Result Value Ref Range   Magnesium 2.0 1.7 - 2.4 mg/dL    Comment: Performed at [facility], [place] [address] [place], [place] [address]  ABO/Rh     Status: None   Collection Time: 09/13/17  2:45 AM  Result Value Ref Range   ABO/RH(D)      B POS Performed at [facility], [place] [address] [place], [place] [address]   POC occult blood, ED     Status: Abnormal   Collection Time: 09/13/17  5:00 AM  Result Value Ref Range   Fecal Occult Bld POSITIVE (A) NEGATIVE  Reticulocytes     Status: None   Collection Time: 09/13/17  8:49 AM  Result Value Ref Range   Retic Ct Pct  0.4 - 3.1 %     QUESTIONABLE RESULTS, RECOMMEND RECOLLECT TO VERIFY    Comment: REORDERED [id number] @ [place] CORRECTED ON 04/23 AT 1021: PREVIOUSLY REPORTED AS 1.5    RBC.  3.87 - 5.11 MIL/uL    QUESTIONABLE RESULTS, RECOMMEND RECOLLECT TO VERIFY    Comment: REORDERED [id number] @ [place] CORRECTED ON 04/23 AT 1021: PREVIOUSLY REPORTED AS 1.82    Retic Count, Absolute  19.0 - 186.0 K/uL    QUESTIONABLE RESULTS, RECOMMEND RECOLLECT TO VERIFY    Comment: REORDERED [id number] @ [place] Performed at [facility], [place] [address], [place], [place] [address] CORRECTED ON 04/23 AT 1021: PREVIOUSLY REPORTED AS 27.3   Protime-INR     Status: None   Collection Time: 09/13/17  8:49 AM  Result Value Ref Range   Prothrombin Time  11.4 - 15.2 seconds    QUESTIONABLE RESULTS, RECOMMEND RECOLLECT TO VERIFY    Comment: REORDERED [id number] @ [place] CORRECTED ON 04/23 AT 1020: PREVIOUSLY REPORTED AS 17.9    INR      QUESTIONABLE RESULTS, RECOMMEND RECOLLECT TO VERIFY  Comment: REORDERED [id number] @ [place] Performed at [place] [address], [place], [place] [address] CORRECTED ON 04/23 AT 1020: PREVIOUSLY REPORTED AS 1.50   APTT     Status: None   Collection Time: 09/13/17  8:49 AM  Result Value Ref Range   aPTT  24 - 36 seconds    QUESTIONABLE RESULTS, RECOMMEND RECOLLECT TO VERIFY    Comment: REORDERED [id number] @ [place] Performed at [place] [address], [place], [place] [address] CORRECTED ON 04/23 AT 1020: PREVIOUSLY REPORTED AS 34   CBC     Status: Abnormal   Collection Time: 09/13/17 10:11 AM  Result Value Ref Range   WBC 6.4 4.0 - 10.5 K/uL   RBC 3.81 (L) 3.87 - 5.11 MIL/uL   Hemoglobin 7.8 (L) 12.0 - 15.0 g/dL    Comment:  RESULTS VERIFIED VIA RECOLLECT   HCT 25.4 (L) 36.0 - 46.0 %   MCV 66.7 (L) 78.0 - 100.0 fL   MCH 20.5 (L) 26.0 - 34.0 pg   MCHC 30.7 30.0 - 36.0 g/dL   RDW 21.6 (H) 11.5 - 15.5 %   Platelets 250 150 - 400 K/uL    Comment: Performed at [facility], [place] [address], [place], [place] [address]  Reticulocytes     Status: Abnormal   Collection Time: 09/13/17 10:11 AM  Result Value Ref Range   Retic Ct Pct 1.2 0.4 - 3.1 %   RBC. 3.81 (L) 3.87 - 5.11 MIL/uL   Retic Count, Absolute 45.7 19.0 - 186.0 K/uL    Comment: Performed at [facility], [place] [address], [place], [place] [address]  hCG, quantitative, pregnancy     Status: None   Collection Time: 09/13/17 10:11 AM  Result Value Ref Range   hCG, Beta Chain, Quant, S <1 <5 mIU/mL    Comment:          GEST. AGE      CONC.  (mIU/mL)   <=1 WEEK        5 - 50     2 WEEKS       50 - 500     3 WEEKS       100 - 10,000     4 WEEKS     1,000 - 30,000     5 WEEKS     3,500 - 115,000   6-8 WEEKS     12,000 - 270,000    12 WEEKS     15,000 - 220,000        FEMALE AND NON-PREGNANT FEMALE:     LESS THAN 5 mIU/mL Performed at [facility], [place] [address] [place], [place] [address]   Protime-INR     Status: None   Collection Time: 09/13/17 10:11 AM  Result Value Ref Range   Prothrombin Time 13.1 11.4 - 15.2 seconds   INR 1.00     Comment: Performed at [facility], [place] [address], [place], [place] [address]  APTT     Status: None   Collection Time: 09/13/17 10:11 AM  Result Value Ref Range   aPTT 26 24 - 36 seconds    Comment: Performed at [facility], [place] [address], [place], [place] [address]    Dg Chest Port 1 View  Result Date: 09/13/2017 CLINICAL DATA:  Hypoxia EXAM: PORTABLE CHEST 1 VIEW COMPARISON:  June 17, 2017 FINDINGS: There is no edema or consolidation. The heart size and pulmonary vascularity are normal. No adenopathy. No bone  lesions. IMPRESSION: No edema or consolidation. Electronically Signed   By: Lowella Grip III M.D.   On: 09/13/2017 09:35    ROSnegative except above Blood pressure (!) 167/100, pulse (!) 58, temperature 98 F (36.7 C), temperature source Oral, resp. rate 17, height 5' 10"  (1.778 m), weight 84.4 kg (186 lb), last menstrual period 09/01/2017, SpO2 96 %. 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

## 2017-09-13 NOTE — ED Notes (Signed)
Pt is now wanting to stay but not receive treatment.

## 2017-09-13 NOTE — ED Notes (Signed)
Pt is in room laying in bed.

## 2017-09-13 NOTE — ED Notes (Signed)
Pt is refusing treatment but pt does not want to leave. RN Brewing technologist have both explained to pt why pt needed two IV's and why patient needed to be on cardiac Monitor. Pt continues to raise voice and be degrading to staff.

## 2017-09-13 NOTE — ED Notes (Addendum)
Pt now states she is concerned about opiate withdrawal. Admitting doctor paged.

## 2017-09-13 NOTE — Progress Notes (Signed)
Patient refused telemetry and Lovenox, patient is educated

## 2017-09-13 NOTE — ED Notes (Signed)
Bed: WA15 Expected date:  Expected time:  Means of arrival:  Comments: Hold for RES 

## 2017-09-13 NOTE — ED Notes (Addendum)
Pt noted to be in Hallway, upset and yelling at staff.  Pt is asking why she is here and is stating she "wants something to eat, to know where my cigarettes are, and wants to go home."  [facility] RN attempted to explain to the Pt that she is being admitted.  Other staff members report this has been repetitive behavior throughout her stay and she has been educated by multiple ED staff and the hospitalist.

## 2017-09-13 NOTE — ED Notes (Signed)
RN has called [facility] [place]. [facility] stated he will be in route to speak with patient ASAP.

## 2017-09-13 NOTE — ED Notes (Addendum)
Pt now states she wants to stay and be treated. New IV started, new bags of maintenance fluid and protonix drip hung.

## 2017-09-13 NOTE — ED Notes (Addendum)
Unable to obtain blood cultures  

## 2017-09-13 NOTE — Care Management (Signed)
This is a no charge note  Pending admission per Dr. Kathrynn Humble  42 year old lady with past medical history of IV drug use, hypertension, bipolar disorder, migraine headaches, tobacco abuse, who presents with apnea. Per report, pt stopped breathing 2 or 3 times.  UDS positive for benzos, cocaine, opiate and THC.  She was given Narcan and woke up now. She was found to have anemia with hemoglobin 7.3.  Baseline hemoglobin 8-10, FOBT positive. Started IV Protonix.  Follow-up CBC q6h. patient was initially hypotensive with blood pressure 89/57, which improved to 131/99.  Potassium 2.7 which is being repleted.    Ivor Costa, MD  [facility] Pager [phone]  If 7PM-7AM, please contact night-coverage [email or web address] Password [email or web address] 09/13/2017, 5:50 AM

## 2017-09-13 NOTE — H&P (Signed)
History and Physical  DARREN NODAL TKW:[id number] DOB: 10-23-1975 DOA: 09/12/2017  Referring physician: Dr Kathrynn Humble  PCP: [name], MD  Outpatient Specialists:  Patient coming from: Home  Chief Complaint: Apnea   HPI: Terri [name] is a 42 y.o. female with medical history significant for drug use, polysubstance abuse including cocaine, heroine, THC, hypertension, depression, anxiety, who presented to [facility] ED after being found apneic in her home.  Patient has no recollection of this event and is a poor historian.  History is mainly obtained from medical records and ED note.  Patient denies recent IV drug use although UDS positive for opiates, cocaine, and THC.  She threatens to leave AMA if she does not receive any opiates.  Reports severe epigastric pain.  Declines GI cocktail and requests IV morphine.  ED Course: Upon presentation to the ED patient was somnolent and was given Narcan for reversal with good response.  Lab studies remarkable for drop in hemoglobin, positive FOBT, and hypokalemia.  Admitted for suspected upper GI bleed.  Review of Systems: Review of systems as noted in HPI.  All other systems reviewed and are negative.   Past Medical History:  Diagnosis Date  . Bipolar disorder ([place])   . Chronic back pain   . Hypertension   . Migraine headache   . Sciatica   . Stress incontinence    Past Surgical History:  Procedure Laterality Date  . ANKLE SURGERY    . BACK SURGERY    . CESAREAN SECTION    . CHOLECYSTECTOMY N/A 02/15/2017   Procedure: LAPAROSCOPIC CHOLECYSTECTOMY;  Surgeon: Clovis Riley, MD;  Location: [facility] ORS;  Service: General;  Laterality: N/A;  . FRACTURE SURGERY    . GASTRIC BYPASS    . INCISION AND DRAINAGE OF PERITONSILLAR ABCESS Left 08/13/2017   Procedure: INCISION AND DRAINAGE OF LEFT NECK ABSCESS;  Surgeon: Melida Quitter, MD;  Location: [facility] ORS;  Service: ENT;  Laterality: Left;  . LAPAROSCOPIC LYSIS OF ADHESIONS  02/15/2017   Procedure:  LAPAROSCOPIC LYSIS OF ADHESIONS;  Surgeon: Clovis Riley, MD;  Location: [facility] ORS;  Service: General;;  . LUMBAR [place]    . LUMBAR FUSION    . TUBAL LIGATION    . UPPER GI ENDOSCOPY N/A 02/15/2017   Procedure: UPPER GI ENDOSCOPY;  Surgeon: Clovis Riley, MD;  Location: [facility] ORS;  Service: General;  Laterality: N/A;    Social History:  reports that she has been smoking cigarettes.  She has been smoking about 1.00 pack per day. She has never used smokeless tobacco. She reports that she has current or past drug history. Drug: IV. She reports that she does not drink alcohol.   Allergies  Allergen Reactions  . Ketorolac Tromethamine Itching and Nausea And Vomiting  . Methocarbamol Diarrhea    Family History  Problem Relation Age of Onset  . Diabetes Mother   . Hypertension Mother   . Diabetes Father   . Hypertension Father     Self-reported Father has heart disease.  Prior to Admission medications   Medication Sig Start Date End Date Taking? Authorizing Provider  acetaminophen (TYLENOL) 500 MG tablet Take 1,000 mg by mouth every 6 (six) hours as needed for moderate pain.   Yes [provider]  albuterol (PROVENTIL HFA;VENTOLIN HFA) 108 (90 BASE) MCG/ACT inhaler Inhale 1-2 puffs into the lungs every 4 (four) hours as needed for wheezing or shortness of breath. 03/07/15  Yes Pisciotta, Elmyra Ricks, PA-C  amitriptyline (ELAVIL) 50 MG tablet  Take 50 mg by mouth at bedtime. 08/24/17  Yes [provider]  amLODipine (NORVASC) 5 MG tablet Take 5 mg by mouth daily. 08/25/17  Yes [provider]  DULoxetine (CYMBALTA) 60 MG capsule Take 60 mg by mouth daily. 12/23/14  Yes [provider]  gabapentin (NEURONTIN) 300 MG capsule Take 300 mg by mouth 3 (three) times daily. 08/27/17  Yes [provider]  ibuprofen (ADVIL,MOTRIN) 200 MG tablet Take 2 tablets (400 mg total) by mouth every 6 (six) hours as needed for moderate pain. 08/19/17  Yes Rosita Fire, MD  lisinopril-hydrochlorothiazide (PRINZIDE,ZESTORETIC) 10-12.5 MG tablet Take 1 tablet by mouth daily. 01/22/17  Yes [provider]  ondansetron (ZOFRAN-ODT) 8 MG disintegrating tablet Take 8 mg by mouth every 8 (eight) hours as needed for nausea or vomiting.  08/20/17  Yes [provider]  oxyCODONE-acetaminophen (PERCOCET/ROXICET) 5-325 MG tablet Take 1 tablet by mouth 2 (two) times daily as needed for pain. 08/30/17  Yes [provider]  pantoprazole (PROTONIX) 40 MG tablet Take 40 mg by mouth 2 (two) times daily. 12/16/16  Yes [provider]  docusate sodium (COLACE) 100 MG capsule Take 1 capsule (100 mg total) by mouth 2 (two) times daily. Patient not taking: Reported on 09/08/2017 08/19/17   Rosita Fire, MD  polyethylene glycol [facility] / Floria Raveling) packet Take 17 g by mouth daily as needed. Patient not taking: Reported on 09/08/2017 08/19/17   Rosita Fire, MD  tiZANidine (ZANAFLEX) 4 MG tablet Take 1 tablet (4 mg total) by mouth every 8 (eight) hours as needed for muscle spasms. Patient not taking: Reported on 09/08/2017 08/19/17   Rosita Fire, MD    Physical Exam: BP 137/84   Pulse 61   Resp 13   Ht 5\' 10"  (1.778 m)   Wt 84.4 kg (186 lb)   LMP 09/01/2017   SpO2 100%   BMI 26.69 kg/m   General: 42 year old Caucasian female well-developed well-nourished appears uncomfortable due to epigastric pain.  Alert and oriented x3. Eyes: Anicteric sclera. ENT: Mucous membranes dry with no exudate or erythema. Neck: No JVD or thyromegaly. Cardiovascular: Regular rate and rhythm with no rubs or gallops. Respiratory: Clear to auscultation with no wheezes or rales. Abdomen: Epigastric tenderness on palpation.  No distention.  Normal bowel sounds x4 quadrants. Skin: Right dorsal portion of right thumb with an ulcerative lesion.  Tender to palpation.  Very unkept. Musculoskeletal: Moves all 4 extremities.  No lower extremity  edema. Psychiatric: Mood is anxious. Neurologic: Alert and oriented x3.Marland Kitchen  No focal motor deficits.          Labs on Admission:  Basic Metabolic Panel: Recent Labs  Lab 09/08/17 1951 09/12/17 2331 09/13/17 0245  NA 141 141  --   K 4.0 2.7*  --   CL 109 105  --   CO2 25 26  --   GLUCOSE 84 83  --   BUN 10 9  --   CREATININE 0.52 0.99  --   CALCIUM 8.5* 8.1*  --   MG  --   --  2.0   Liver Function Tests: Recent Labs  Lab 09/12/17 2331  AST 16  ALT <5*  ALKPHOS 94  BILITOT 0.4  PROT 6.0*  ALBUMIN 3.0*   No results for input(s): LIPASE, AMYLASE in the last 168 hours. No results for input(s): AMMONIA in the last 168 hours. CBC: Recent Labs  Lab 09/08/17 1951 09/12/17 2331  WBC 8.7 7.9  NEUTROABS 5.1 4.3  HGB 8.0* 7.3*  HCT 26.0* 23.6*  MCV 65.5* 64.8*  PLT 294 257   Cardiac Enzymes: No results for input(s): CKTOTAL, CKMB, CKMBINDEX, TROPONINI in the last 168 hours.  BNP (last 3 results) No results for input(s): BNP in the last 8760 hours.  ProBNP (last 3 results) No results for input(s): PROBNP in the last 8760 hours.  CBG: No results for input(s): GLUCAP in the last 168 hours.  Radiological Exams on Admission: Dg Chest Port 1 View  Result Date: 09/13/2017 CLINICAL DATA:  Hypoxia EXAM: PORTABLE CHEST 1 VIEW COMPARISON:  June 17, 2017 FINDINGS: There is no edema or consolidation. The heart size and pulmonary vascularity are normal. No adenopathy. No bone lesions. IMPRESSION: No edema or consolidation. Electronically Signed   By: Lowella Grip III M.D.   On: 09/13/2017 09:35    EKG: Independently reviewed.  Personally reviewed EKG which revealed sinus rhythm with a rate of 64.  Assessment/Plan Present on Admission: . GIB (gastrointestinal bleeding) . Apnea  Principal Problem:   GIB (gastrointestinal bleeding) Active Problems:   Apnea  Apnea most likely secondary to polysubstance abuse Narcan given in the ED for reversal with good  response Closely monitor on pain medications Telemetry O2 supplementation to maintain O2 saturation 92% or greater  Suspected upper GI bleed Dropping hemoglobin from 9.9 to 8.0 to 7.3 Positive FOBT Persistent epigastric pain Continue IV Protonix Refuses GI cocktail IV morphine 2 mg q4h prn Narcan prn for toxicity Consult GI Monitor vital signs Repeat CBC in the morning  Polysubstance abuse including cocaine, heroin, and THC Polysubstance cessation counseling at bedside History of IV drug use Patient denies recent IV drug use UDS 09/12/17 positive for benzo, opiates, cocaine, THC Obtain blood cultures x2 Last HIV and hepatitis C screening were negative on August 15, 2017  Severe hypokalemia Suspect secondary to malnutrition Potassium 2.7 Repleted with oral potassium and magnesium IV 2 g Repeat BMP in the morning  Moderate protein calorie malnutrition Albumin 3.0 Encourage increase p.o. calorie intake    DVT prophylaxis: SCDs  Code Status: Full code  Family Communication: None at bedside  Disposition Plan: Admit to telemetry  Consults called: GI  Admission status: Inpatient    Kayleen Memos MD [facility] Pager [phone]  If 7PM-7AM, please contact night-coverage [email or web address] Password [facility]  09/13/2017, 10:42 AM

## 2017-09-13 NOTE — ED Notes (Signed)
Pt wants to be discharged. RN and Hospitalist have both spoken to pt about pt not receiving anymore IV Narcotics. Hospitalist has also explained in detail what is physically wrong with patient and that if patient decides to leave there is a chance the patient may potentially harm themselves or die. Pt does not care. RN has offered GI cocktail and Tramadol. Pt has refused. I have informed Hospitalist and Charge RN has been made aware. EMT is in room taking IV's out and preparing pt for discharge.

## 2017-09-14 DIAGNOSIS — T40605A Adverse effect of unspecified narcotics, initial encounter: Secondary | ICD-10-CM

## 2017-09-14 DIAGNOSIS — R0689 Other abnormalities of breathing: Secondary | ICD-10-CM

## 2017-09-14 DIAGNOSIS — T402X1A Poisoning by other opioids, accidental (unintentional), initial encounter: Secondary | ICD-10-CM

## 2017-09-14 DIAGNOSIS — K2971 Gastritis, unspecified, with bleeding: Secondary | ICD-10-CM

## 2017-09-14 DIAGNOSIS — E876 Hypokalemia: Secondary | ICD-10-CM

## 2017-09-14 DIAGNOSIS — D649 Anemia, unspecified: Secondary | ICD-10-CM

## 2017-09-14 LAB — CBC
HEMATOCRIT: 22.4 % — AB (ref 36.0–46.0)
HEMOGLOBIN: 7.1 g/dL — AB (ref 12.0–15.0)
MCH: 20.7 pg — ABNORMAL LOW (ref 26.0–34.0)
MCHC: 31.7 g/dL (ref 30.0–36.0)
MCV: 65.3 fL — AB (ref 78.0–100.0)
Platelets: 255 10*3/uL (ref 150–400)
RBC: 3.43 MIL/uL — ABNORMAL LOW (ref 3.87–5.11)
RDW: 21.7 % — ABNORMAL HIGH (ref 11.5–15.5)
WBC: 5.1 10*3/uL (ref 4.0–10.5)

## 2017-09-14 LAB — COMPREHENSIVE METABOLIC PANEL
ALBUMIN: 2.5 g/dL — AB (ref 3.5–5.0)
ALK PHOS: 91 U/L (ref 38–126)
ALT: 9 U/L — AB (ref 14–54)
AST: 13 U/L — AB (ref 15–41)
Anion gap: 9 (ref 5–15)
BILIRUBIN TOTAL: 0.5 mg/dL (ref 0.3–1.2)
BUN: 5 mg/dL — AB (ref 6–20)
CALCIUM: 8.1 mg/dL — AB (ref 8.9–10.3)
CO2: 25 mmol/L (ref 22–32)
CREATININE: 0.56 mg/dL (ref 0.44–1.00)
Chloride: 109 mmol/L (ref 101–111)
GFR calc Af Amer: >60 mL/min (ref >60)
GFR calc non Af Amer: >60 mL/min (ref >60)
GLUCOSE: 94 mg/dL (ref 65–99)
Potassium: 3.2 mmol/L — ABNORMAL LOW (ref 3.5–5.1)
Sodium: 143 mmol/L (ref 135–145)
TOTAL PROTEIN: 5.2 g/dL — AB (ref 6.5–8.1)

## 2017-09-14 LAB — IRON AND TIBC
Iron: 10 ug/dL — ABNORMAL LOW (ref 28–170)
TIBC: 375 ug/dL (ref 250–450)
UIBC: 365 ug/dL

## 2017-09-14 LAB — PREPARE RBC (CROSSMATCH)

## 2017-09-14 MED ORDER — MORPHINE SULFATE (PF) 2 MG/ML IV SOLN
2.0000 mg | INTRAVENOUS | Status: DC | PRN
  Administered 2017-09-14 (×2): 2 mg via INTRAVENOUS
  Filled 2017-09-14 (×2): qty 1

## 2017-09-14 MED ORDER — POTASSIUM CHLORIDE CRYS ER 20 MEQ PO TBCR
40.0000 meq | EXTENDED_RELEASE_TABLET | ORAL | Status: AC
  Administered 2017-09-14 (×2): 40 meq via ORAL
  Filled 2017-09-14 (×2): qty 2

## 2017-09-14 MED ORDER — SODIUM CHLORIDE 0.9 % IV SOLN
Freq: Once | INTRAVENOUS | Status: AC
  Administered 2017-09-14: 09:00:00 via INTRAVENOUS

## 2017-09-14 MED ORDER — OXYCODONE HCL 5 MG PO TABS
5.0000 mg | ORAL_TABLET | Freq: Two times a day (BID) | ORAL | Status: DC | PRN
  Administered 2017-09-14: 5 mg via ORAL
  Filled 2017-09-14: qty 1

## 2017-09-14 MED ORDER — MORPHINE SULFATE (PF) 2 MG/ML IV SOLN
INTRAVENOUS | Status: AC
  Filled 2017-09-14: qty 1

## 2017-09-14 MED ORDER — MORPHINE SULFATE (PF) 4 MG/ML IV SOLN
2.0000 mg | INTRAVENOUS | Status: DC | PRN
  Administered 2017-09-14 – 2017-09-15 (×11): 2 mg via INTRAVENOUS
  Filled 2017-09-14 (×11): qty 1

## 2017-09-14 MED ORDER — OXYCODONE HCL 5 MG PO TABS
5.0000 mg | ORAL_TABLET | Freq: Two times a day (BID) | ORAL | Status: DC | PRN
  Administered 2017-09-14 – 2017-09-15 (×2): 5 mg via ORAL
  Filled 2017-09-14 (×2): qty 1

## 2017-09-14 MED ORDER — MORPHINE SULFATE (PF) 2 MG/ML IV SOLN
1.0000 mg | INTRAVENOUS | Status: DC | PRN
  Administered 2017-09-14 (×2): 1 mg via INTRAVENOUS
  Filled 2017-09-14: qty 1

## 2017-09-14 MED ORDER — ONDANSETRON HCL 4 MG/2ML IJ SOLN: 4.0000 mg | Freq: Four times a day (QID) | INTRAMUSCULAR | Status: DC | PRN

## 2017-09-14 MED ORDER — PANTOPRAZOLE SODIUM 40 MG IV SOLR
40.0000 mg | Freq: Two times a day (BID) | INTRAVENOUS | Status: DC
  Administered 2017-09-14 – 2017-09-15 (×3): 40 mg via INTRAVENOUS
  Filled 2017-09-14 (×4): qty 40

## 2017-09-14 MED ORDER — SODIUM CHLORIDE 0.9 % IV SOLN
510.0000 mg | Freq: Once | INTRAVENOUS | Status: AC
  Administered 2017-09-14: 510 mg via INTRAVENOUS
  Filled 2017-09-14 (×2): qty 17

## 2017-09-14 NOTE — Progress Notes (Signed)
Pt verbally aggressive with NA wanting to receive pain medication at 2pm when medication was not yet due.  This RN went to patient and explained it was still too early to get  Medication and also patient receiving blood with only one IV in place.  Attempted to start 2nd IV two times and unable to get.  Second RN trying to place IV currently due to patient yelling and threatening to leave while blood still infusing.  Notified MD of patient's intentions if 2nd IV attempt not achieved.  Will continue to monitor and f/u on patient's outcome

## 2017-09-14 NOTE — Progress Notes (Signed)
Patient arrived to unit around 1645. Patient alert and oriented x4. VSS. Patient is complaining of 8/10 pain informed her it was not time yet and she understood. Assesment is unchanged from previous. Will continue to monitor.

## 2017-09-14 NOTE — Progress Notes (Signed)
Second RN's attempt to place IV not achieved so placed order for ASAP IV team consult.  Pt ripped off blood bank bracelet stating she wanted to go home, agreed to wait for IV team to attempt placement

## 2017-09-14 NOTE — Progress Notes (Signed)
Pt has 2nd IV placed per IV team.  IV pain medication given, pt agrees to stay for treatment.

## 2017-09-14 NOTE — Progress Notes (Signed)
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

## 2017-09-14 NOTE — Progress Notes (Signed)
PROGRESS NOTE    Terri [name]  JFH:[id number] DOB: [birth year] DOA: 09/12/2017 PCP: [name], MD    Brief Narrative:  42 year old female presented with apnea event.  Patient does have a history of substance abuse including cocaine she also has hypertension, depression and anxiety.  Patient was found unresponsive at home she received naloxone, with improvement of her symptoms.  On the initial physical examination blood pressure 137/84, heart rate 61, RR 13, oxygen saturation 100%.  Dry mucous membranes, lungs clear to auscultation bilaterally, heart S1-S2 present rhythmic, abdomen tender in epigastric region, no lower extremity edema, neurologically patient nonfocal.  Sodium 141, potassium 2.7, chloride 105, bicarb 26, glucose 83, BUN 9, creatinine 0.99, white count 7.9, hemoglobin 7.3, hematocrit 23.6, platelets 257, urine drug screen positive for benzodiazepines, opiates, cocaine and cannabinoids.  CT of the abdomen was small dense nodule projected over the posterior bladder.  Suspicious for small mass, neoplasm, recommend cystoscopy.  Chest x-ray negative for infiltrates.  EKG normal sinus rhythm, normal intervals, normal axis.  Hemoccult was negative then repeat was positive.  Patient was admitted to the hospital with a working diagnosis of drug overdose, complicated with apnea and anemia.  Assessment & Plan:   Principal Problem:   GIB (gastrointestinal bleeding) Active Problems:   Apnea  1.  Drug overdose. Patient is more awake and alert, denies overdose or suicidal attempt, no nausea or vomiting.   2.  Acute on chronic anemia, rule out upper GI bleed. Critical hb down to 7.1. Will transfuse one unit prbc, and will follow on cell count in am. Order one dose IV iron. Follow with GI recommendations, patient will need EGD on this admission, continue antiacid therapy with pantoprazole. IV analgesics with morphine, IV fluids and as needed antiemetics.   3.  Hypokalemia. Continue K  repletion with Kcl, will follow renal panel in am , renal function preserved with serum cr at 0,56.   4.  Calorie protein malnutrition. Will need nutritional evaluation on this admission.   5. HTN. Continue amlodipine, lisinopril, and HCTZ for blood pressure control   6, Depression. Will continue duloxetine    DVT prophylaxis: scd  Code Status:  full Family Communication: no family at the bedside  Disposition Plan: home in am after egd   Consultants:    GI   Procedures:     Antimicrobials:       Subjective: Patient with sever abdominal pain, epigastric, denies melena, hematemesis, or hematochezia, no nausea or vomiting. At home on oxycodone for chronic pain.   Objective: Vitals:   09/13/17 1709 09/13/17 2008 09/14/17 0649 09/14/17 0651  BP:  (!) 145/97 (!) 148/90 (!) 162/103  Pulse: 77 67    Resp: 15 16  16   Temp:  98.2 F (36.8 C)  97.9 F (36.6 C)  TempSrc:  Oral  Oral  SpO2:  97%  97%  Weight:  86.6 kg (190 lb 14.7 oz)    Height:  5\' 10"  (1.778 m)      Intake/Output Summary (Last 24 hours) at 09/14/2017 0817 Last data filed at 09/14/2017 0759 Gross per 24 hour  Intake 1248.33 ml  Output -  Net 1248.33 ml   Filed Weights   09/12/17 2305 09/13/17 2008  Weight: 84.4 kg (186 lb) 86.6 kg (190 lb 14.7 oz)    Examination:   General: deconditioned and ill looking appearing Neurology: Awake and alert, non focal  E ENT: positive pallor, no icterus, oral mucosa moist Cardiovascular: No JVD. S1-S2 present, rhythmic,  no gallops, rubs, or murmurs. No lower extremity edema. Pulmonary: vesicular breath sounds bilaterally, adequate air movement, no wheezing, rhonchi or rales. Gastrointestinal. Abdomen with no organomegaly. Tender to palpation at the e[igastrium with no rebound or guarding Skin. No rashes Musculoskeletal: no joint deformities     Data Reviewed: I have personally reviewed following labs and imaging studies  CBC: Recent Labs  Lab  09/08/17 1951 09/12/17 2331 09/13/17 1011 09/13/17 2059 09/14/17 0249  WBC 8.7 7.9 6.4 5.8 5.1  NEUTROABS 5.1 4.3  --   --   --   HGB 8.0* 7.3* 7.8* 7.2* 7.1*  HCT 26.0* 23.6* 25.4* 22.8* 22.4*  MCV 65.5* 64.8* 66.7* 64.8* 65.3*  PLT 294 257 250 238 347   Basic Metabolic Panel: Recent Labs  Lab 09/08/17 1951 09/12/17 2331 09/13/17 0245 09/14/17 0249  NA 141 141  --  143  K 4.0 2.7*  --  3.2*  CL 109 105  --  109  CO2 25 26  --  25  GLUCOSE 84 83  --  94  BUN 10 9  --  5*  CREATININE 0.52 0.99  --  0.56  CALCIUM 8.5* 8.1*  --  8.1*  MG  --   --  2.0  --    GFR: Estimated Creatinine Clearance: 108.4 mL/min (by C-G formula based on SCr of 0.56 mg/dL). Liver Function Tests: Recent Labs  Lab 09/12/17 2331 09/14/17 0249  AST 16 13*  ALT <5* 9*  ALKPHOS 94 91  BILITOT 0.4 0.5  PROT 6.0* 5.2*  ALBUMIN 3.0* 2.5*   No results for input(s): LIPASE, AMYLASE in the last 168 hours. No results for input(s): AMMONIA in the last 168 hours. Coagulation Profile: Recent Labs  Lab 09/13/17 0849 09/13/17 1011  INR QUESTIONABLE RESULTS, RECOMMEND RECOLLECT TO VERIFY 1.00   Cardiac Enzymes: No results for input(s): CKTOTAL, CKMB, CKMBINDEX, TROPONINI in the last 168 hours. BNP (last 3 results) No results for input(s): PROBNP in the last 8760 hours. HbA1C: No results for input(s): HGBA1C in the last 72 hours. CBG: No results for input(s): GLUCAP in the last 168 hours. Lipid Profile: No results for input(s): CHOL, HDL, LDLCALC, TRIG, CHOLHDL, LDLDIRECT in the last 72 hours. Thyroid Function Tests: No results for input(s): TSH, T4TOTAL, FREET4, T3FREE, THYROIDAB in the last 72 hours. Anemia Panel: Recent Labs    09/13/17 0849 09/13/17 1011 09/13/17 1012  VITAMINB12  --   --  154*  FOLATE  --   --  10.2  FERRITIN  --   --  4*  TIBC  --   --  375  IRON  --   --  10*  RETICCTPCT QUESTIONABLE RESULTS, RECOMMEND RECOLLECT TO VERIFY 1.2  --       Radiology Studies: I  have reviewed all of the imaging during this hospital visit personally     Scheduled Meds: . amitriptyline  50 mg Oral QHS  . amLODipine  5 mg Oral Daily  . docusate sodium  100 mg Oral BID  . DULoxetine  60 mg Oral Daily  . lisinopril  10 mg Oral Daily   And  . hydrochlorothiazide  12.5 mg Oral Daily  . [START ON 09/16/2017] pantoprazole  40 mg Intravenous Q12H  . polyethylene glycol  17 g Oral Daily   Continuous Infusions: . sodium chloride 75 mL/hr at 09/14/17 0600  . famotidine (PEPCID) IV Stopped (09/13/17 2224)  . pantoprozole (PROTONIX) infusion 8 mg/hr (09/14/17 0759)     LOS:  1 day        Tawni Millers, MD [facility] Pager [phone]

## 2017-09-15 ENCOUNTER — Encounter (HOSPITAL_COMMUNITY)
Admission: EM | Disposition: A | Discharge status: Home / Self Care | Payer: Self-pay | Source: Home / Self Care | Attending: Internal Medicine

## 2017-09-15 ENCOUNTER — Inpatient Hospital Stay (HOSPITAL_COMMUNITY): Payer: Medicare Other | Admitting: Certified Registered Nurse Anesthetist

## 2017-09-15 ENCOUNTER — Encounter (HOSPITAL_COMMUNITY): Payer: Self-pay | Admitting: Emergency Medicine

## 2017-09-15 DIAGNOSIS — R0681 Apnea, not elsewhere classified: Secondary | ICD-10-CM

## 2017-09-15 DIAGNOSIS — R0902 Hypoxemia: Secondary | ICD-10-CM

## 2017-09-15 HISTORY — PX: ESOPHAGOGASTRODUODENOSCOPY (EGD) WITH PROPOFOL: SHX5813

## 2017-09-15 LAB — BASIC METABOLIC PANEL WITH GFR
Anion gap: 10 (ref 5–15)
BUN: 5 mg/dL — ABNORMAL LOW (ref 6–20)
CO2: 23 mmol/L (ref 22–32)
Calcium: 8.7 mg/dL — ABNORMAL LOW (ref 8.9–10.3)
Chloride: 105 mmol/L (ref 101–111)
Creatinine, Ser: 0.56 mg/dL (ref 0.44–1.00)
GFR calc Af Amer: >60 mL/min
GFR calc non Af Amer: >60 mL/min
Glucose, Bld: 66 mg/dL (ref 65–99)
Potassium: 3.9 mmol/L (ref 3.5–5.1)
Sodium: 138 mmol/L (ref 135–145)

## 2017-09-15 LAB — BPAM RBC
Blood Product Expiration Date: 201905232359
ISSUE DATE / TIME: 201904241150
Unit Type and Rh: 7300

## 2017-09-15 LAB — TYPE AND SCREEN
ABO/RH(D): B POS
ANTIBODY SCREEN: NEGATIVE
Unit division: 0

## 2017-09-15 LAB — CBC WITH DIFFERENTIAL/PLATELET
Basophils Absolute: 0 K/uL (ref 0.0–0.1)
Basophils Relative: 1 %
Eosinophils Absolute: 0.1 K/uL (ref 0.0–0.7)
Eosinophils Relative: 3 %
HCT: 29 % — ABNORMAL LOW (ref 36.0–46.0)
Hemoglobin: 9 g/dL — ABNORMAL LOW (ref 12.0–15.0)
Lymphocytes Relative: 36 %
Lymphs Abs: 1.5 K/uL (ref 0.7–4.0)
MCH: 20.9 pg — ABNORMAL LOW (ref 26.0–34.0)
MCHC: 31 g/dL (ref 30.0–36.0)
MCV: 67.3 fL — ABNORMAL LOW (ref 78.0–100.0)
Monocytes Absolute: 0.3 K/uL (ref 0.1–1.0)
Monocytes Relative: 8 %
Neutro Abs: 2.4 K/uL (ref 1.7–7.7)
Neutrophils Relative %: 52 %
Platelets: 241 K/uL (ref 150–400)
RBC: 4.31 MIL/uL (ref 3.87–5.11)
RDW: 23.2 % — ABNORMAL HIGH (ref 11.5–15.5)
WBC: 4.3 K/uL (ref 4.0–10.5)

## 2017-09-15 LAB — MAGNESIUM: Magnesium: 1.9 mg/dL (ref 1.7–2.4)

## 2017-09-15 SURGERY — ESOPHAGOGASTRODUODENOSCOPY (EGD) WITH PROPOFOL
Anesthesia: Monitor Anesthesia Care

## 2017-09-15 MED ORDER — PROPOFOL 500 MG/50ML IV EMUL
INTRAVENOUS | Status: DC | PRN
  Administered 2017-09-15: 150 ug/kg/min via INTRAVENOUS

## 2017-09-15 MED ORDER — SODIUM CHLORIDE 0.9 % IV SOLN: INTRAVENOUS | Status: DC

## 2017-09-15 MED ORDER — LIDOCAINE 2% (20 MG/ML) 5 ML SYRINGE
INTRAMUSCULAR | Status: DC | PRN
  Administered 2017-09-15: 100 mg via INTRAVENOUS

## 2017-09-15 MED ORDER — PANTOPRAZOLE SODIUM 40 MG PO TBEC: 40.0000 mg | DELAYED_RELEASE_TABLET | Freq: Every day | ORAL | 0 refills | Status: DC

## 2017-09-15 MED ORDER — LACTATED RINGERS IV SOLN
INTRAVENOUS | Status: DC | PRN
  Administered 2017-09-15: 14:00:00 via INTRAVENOUS

## 2017-09-15 MED ORDER — ONDANSETRON HCL 4 MG/2ML IJ SOLN
INTRAMUSCULAR | Status: DC | PRN
  Administered 2017-09-15: 4 mg via INTRAVENOUS

## 2017-09-15 MED ORDER — PROPOFOL 10 MG/ML IV BOLUS
INTRAVENOUS | Status: AC
  Filled 2017-09-15: qty 60

## 2017-09-15 MED ORDER — ACETAMINOPHEN 500 MG PO TABS: 500.0000 mg | ORAL_TABLET | Freq: Four times a day (QID) | ORAL | 0 refills | Status: DC | PRN

## 2017-09-15 MED ORDER — PROPOFOL 10 MG/ML IV BOLUS
INTRAVENOUS | Status: DC | PRN
  Administered 2017-09-15 (×3): 20 mg via INTRAVENOUS
  Administered 2017-09-15 (×3): 40 mg via INTRAVENOUS
  Administered 2017-09-15: 20 mg via INTRAVENOUS

## 2017-09-15 SURGICAL SUPPLY — 15 items

## 2017-09-15 NOTE — Transfer of Care (Signed)
Immediate Anesthesia Transfer of Care Note  Patient: Terri [name]  Procedure(s) Performed: ESOPHAGOGASTRODUODENOSCOPY (EGD) WITH PROPOFOL (N/A )  Patient Location: Endoscopy Unit  Anesthesia Type:MAC  Level of Consciousness: drowsy and patient cooperative  Airway & Oxygen Therapy: Patient Spontanous Breathing and Patient connected to nasal cannula oxygen  Post-op Assessment: Report given to RN and Post -op Vital signs reviewed and stable  Post vital signs: Reviewed and stable  Last Vitals:  Vitals Value Taken Time  BP    Temp    Pulse 75 09/15/2017  2:31 PM  Resp 16 09/15/2017  2:31 PM  SpO2 100 % 09/15/2017  2:31 PM  Vitals shown include unvalidated device data.  Last Pain:  Vitals:   09/15/17 1253  TempSrc: Oral  PainSc:       Patients Stated Pain Goal: 4 (41/96/22 2979)  Complications: No apparent anesthesia complications

## 2017-09-15 NOTE — Anesthesia Postprocedure Evaluation (Signed)
Anesthesia Post Note  Patient: Terri [name]  Procedure(s) Performed: ESOPHAGOGASTRODUODENOSCOPY (EGD) WITH PROPOFOL (N/A )     Patient location during evaluation: PACU Anesthesia Type: MAC Level of consciousness: awake and alert Pain management: pain level controlled Vital Signs Assessment: post-procedure vital signs reviewed and stable Respiratory status: spontaneous breathing, nonlabored ventilation, respiratory function stable and patient connected to nasal cannula oxygen Cardiovascular status: stable and blood pressure returned to baseline Postop Assessment: no apparent nausea or vomiting Anesthetic complications: no    Last Vitals:  Vitals:   09/15/17 1447 09/15/17 1530  BP: 124/88 (!) 157/106  Pulse: 63 82  Resp: 11 12  Temp:  37.4 C  SpO2: 97% 97%    Last Pain:  Vitals:   09/15/17 1530  TempSrc: Oral  PainSc:                  Onofrio Klemp P Maxum Cassarino

## 2017-09-15 NOTE — Discharge Summary (Signed)
Physician Discharge Summary  Terri [name] RJJ:[id number] DOB: 1975-11-14 DOA: 09/12/2017  PCP: [name], MD  Admit date: 09/12/2017 Discharge date: 09/15/2017  Admitted From: Home Disposition:  Home  Recommendations for Outpatient Follow-up and new medication changes:  1. Follow up with PCP in 1- weeks 2. Continue pantoprazole 40 mg daily 3. Patient will benefit from a referral to the pain clinic.    Home Health: no   Equipment/Devices: no    Discharge Condition: stable CODE STATUS: full  Diet recommendation: Heart healthy  Brief/Interim Summary: 42 year old female presented with an apnea event.  Patient does have a history of substance abuse including cocaine, hypertension, depression and anxiety.  Patient was found unresponsive at home she received naloxone, with improvement of her symptoms.  After patient recovered her mentation she gived a history of syncope while ambulating. On the initial physical examination blood pressure 137/84, heart rate 61, RR 13, oxygen saturation 100%.  Dry mucous membranes, lungs clear to auscultation bilaterally, heart S1-S2 present rhythmic, abdomen tender in epigastric region, no lower extremity edema, neurologically patient nonfocal.  Sodium 141, potassium 2.7, chloride 105, bicarb 26, glucose 83, BUN 9, creatinine 0.99, white count 7.9, hemoglobin 7.3, hematocrit 23.6, platelets 257, urine drug screen positive for benzodiazepines, opiates, cocaine and cannabinoids.  CT of the abdomen had a small dense nodule projected over the posterior bladder.  Suspicious for small mass, neoplasm, recommend cystoscopy.  Chest x-ray negative for infiltrates.  EKG normal sinus rhythm, normal intervals, normal axis. Hemoccult was positive.  Patient was admitted to the hospital with a working diagnosis of drug overdose, complicated with apnea and acute blood loss anemia.  1.  Drug overdose.  Patient was admitted to the medical ward, she was placed on a remote  telemetry monitor, frequent neuro checks, and supplemental oxygen per nasal count.  Patient responded well to supportive medical therapy, she recovered her consciousness, no signs of withdrawal.  Denies any suicidal behavior.  Patient will benefit from outpatient pain clinic.  2.  Acute on chronic iron deficiency anemia, complicated by acute blood loss, upper GI bleed.  Patient was admitted to the medical ward, she received 1 unit packed red blood cells, no further signs of bleeding.  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  Diet was advanced progressively with good toleration, patient will continue proton pump inhibitors.  Avoid NSAIDs.  Iron studies show significant iron deficiency with iron of 10, TIBC 375, transferrin saturation 3%, ferritin 4, patient received IV iron (510 mg ferumoxytol) x1.  Discharge hemoglobin 9.0, hematocrit 29.0  3.  Hypokalemia.  She received potassium chloride with correction of serum potassium, renal function remained preserved, discharge potassium 3.9, bicarbonate 23, creatinine 0.56.  4.  Calorie protein malnutrition.  Patient was evaluated by nutritional service, will need close follow-up as an outpatient.  5.  Hypertension.  Blood pressure controlled with amlodipine, lisinopril and hydrochlorothiazide.  6.  Depression and chronic pain syndrome.  Continue duloxetine, patient will benefit from referral to outpatient pain clinic.   Discharge Diagnoses:  Principal Problem:   GIB (gastrointestinal bleeding) Active Problems:   Apnea    Discharge Instructions   Allergies as of 09/15/2017      Reactions   Ketorolac Tromethamine Itching, Nausea And Vomiting   Methocarbamol Diarrhea      Medication List    STOP taking these medications   docusate sodium 100 MG capsule Commonly known as:  COLACE   ibuprofen 200 MG tablet Commonly known as:   ADVIL,MOTRIN   polyethylene glycol packet Commonly known as:  MIRALAX / GLYCOLAX   tiZANidine 4 MG tablet Commonly known as:  ZANAFLEX     TAKE these medications   acetaminophen 500 MG tablet Commonly known as:  TYLENOL Take 1 tablet (500 mg total) by mouth every 6 (six) hours as needed for moderate pain. What changed:  how much to take   albuterol 108 (90 Base) MCG/ACT inhaler Commonly known as:  PROVENTIL HFA;VENTOLIN HFA Inhale 1-2 puffs into the lungs every 4 (four) hours as needed for wheezing or shortness of breath.   amitriptyline 50 MG tablet Commonly known as:  ELAVIL Take 50 mg by mouth at bedtime.   amLODipine 5 MG tablet Commonly known as:  NORVASC Take 5 mg by mouth daily.   DULoxetine 60 MG capsule Commonly known as:  CYMBALTA Take 60 mg by mouth daily.   gabapentin 300 MG capsule Commonly known as:  NEURONTIN Take 300 mg by mouth 3 (three) times daily.   lisinopril-hydrochlorothiazide 10-12.5 MG tablet Commonly known as:  PRINZIDE,ZESTORETIC Take 1 tablet by mouth daily.   ondansetron 8 MG disintegrating tablet Commonly known as:  ZOFRAN-ODT Take 8 mg by mouth every 8 (eight) hours as needed for nausea or vomiting.   oxyCODONE-acetaminophen 5-325 MG tablet Commonly known as:  PERCOCET/ROXICET Take 1 tablet by mouth 2 (two) times daily as needed for pain.   pantoprazole 40 MG tablet Commonly known as:  PROTONIX Take 1 tablet (40 mg total) by mouth daily. What changed:  when to take this       Allergies  Allergen Reactions  . Ketorolac Tromethamine Itching and Nausea And Vomiting  . Methocarbamol Diarrhea    Consultations:  Gastroenterology    Procedures/Studies: Ct Abdomen Pelvis W Contrast  Result Date: 09/13/2017 CLINICAL DATA:  Apnea.  History of IV drug abuse.  Anemia. EXAM: CT ABDOMEN AND PELVIS WITH CONTRAST TECHNIQUE: Multidetector CT imaging of the abdomen and pelvis was performed using the standard protocol following bolus  administration of intravenous contrast. CONTRAST:  117mL ISOVUE-300 IOPAMIDOL (ISOVUE-300) INJECTION 61% COMPARISON:  December 28, 2013 FINDINGS: Lower chest: Bibasilar atelectasis. No other abnormalities in the lung bases. Hepatobiliary: A small cyst is seen in the lateral right hepatic lobe, unchanged since 2015. No suspicious liver masses. Previous cholecystectomy. The portal vein is patent. Mild periportal edema, nonspecific. Pancreas: Unremarkable. No pancreatic ductal dilatation or surrounding inflammatory changes. Spleen: Normal in size without focal abnormality. Adrenals/Urinary Tract: The adrenal glands and kidneys are normal. No ureteral stones. There is a small dense nodule projected over the posterior bladder to the right best seen on sagittal image 74 and axial image 80 measuring up to 7 mm. The bladder is otherwise normal. Stomach/Bowel: Patient is status post gastric bypass. The stomach is otherwise normal. The duodenum and small bowel are unremarkable. Mild fecal loading in the colon. The colon is otherwise normal. The appendix is normal. Vascular/Lymphatic: Atherosclerotic changes are seen in the abdominal aorta, advanced for age. No aneurysm or dissection. No adenopathy. Shotty nodes in the retroperitoneum may be reactive. Reproductive: Uterus and bilateral adnexa are unremarkable. Other: The subcutaneous fat appears edematous. No free air or free fluid. Musculoskeletal: Degenerative changes in the spine. IMPRESSION: 1. There is a small dense nodule projected over the posterior bladder. This is favored to be within the bladder, suspicious for a small mass/neoplasm. Recommend cystoscopy. 2. Subcutaneous edema. 3. Atherosclerotic changes in the  abdominal aorta, and veins for age. 4. Shotty nodes in the retroperitoneum are likely reactive. 5. Degenerative changes in the spine. Electronically Signed   By: Dorise Bullion III M.D   On: 09/13/2017 19:55   Dg Chest Port 1 View  Result Date:  09/13/2017 CLINICAL DATA:  Hypoxia EXAM: PORTABLE CHEST 1 VIEW COMPARISON:  June 17, 2017 FINDINGS: There is no edema or consolidation. The heart size and pulmonary vascularity are normal. No adenopathy. No bone lesions. IMPRESSION: No edema or consolidation. Electronically Signed   By: Lowella Grip III M.D.   On: 09/13/2017 09:35   Dg Hand Complete Right  Result Date: 09/08/2017 CLINICAL DATA:  42 y/o F; wound between thumb and index finger. Rule out osteomyelitis. EXAM: RIGHT HAND - COMPLETE 3+ VIEW COMPARISON:  02/26/2014 right hand radiograph. FINDINGS: There is no evidence of fracture or dislocation. Stable lucent focus within the first distal phalanx likely representing an enchondroma. No lytic or destructive process of the bones. IMPRESSION: No findings of osteomyelitis. 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 Electronically Signed   By: Kristine Garbe M.D.   On: 09/08/2017 19:42       Subjective: Patient is feeling better, no nausea or vomiting, pain is controlled with analgesics, no dyspnea or chest pain.   Discharge Exam: Vitals:   09/14/17 1949 09/15/17 0455  BP: (!) 154/92 (!) 152/93  Pulse: 66 60  Resp: 14 18  Temp: 98.3 F (36.8 C) 98.2 F (36.8 C)  SpO2: 97% 96%   Vitals:   09/14/17 1544 09/14/17 1705 09/14/17 1949 09/15/17 0455  BP: (!) 156/93 (!) 161/102 (!) 154/92 (!) 152/93  Pulse: 74 68 66 60  Resp: 16  14 18   Temp: 98.4 F (36.9 C)  98.3 F (36.8 C) 98.2 F (36.8 C)  TempSrc: Oral  Oral Oral  SpO2: 100% 98% 97% 96%  Weight:      Height:        General: Not in pain or dyspnea, Neurology: Awake and alert, non focal  E ENT: mild pallor, no icterus, oral mucosa moist Cardiovascular: No JVD. S1-S2 present, rhythmic, no gallops, rubs, or murmurs. No lower extremity edema. Pulmonary: vesicular breath sounds bilaterally, adequate air movement, no wheezing, rhonchi or rales. Gastrointestinal. Abdomen with no  organomegaly, non tender, no rebound or guarding Skin. No rashes Musculoskeletal: no joint deformities   The results of significant diagnostics from this hospitalization (including imaging, microbiology, ancillary and laboratory) are listed below for reference.     Microbiology: No results found for this or any previous visit (from the past 240 hour(s)).   Labs: BNP (last 3 results) No results for input(s): BNP in the last 8760 hours. Basic Metabolic Panel: Recent Labs  Lab 09/08/17 1951 09/12/17 2331 09/13/17 0245 09/14/17 0249 09/15/17 0608  NA 141 141  --  143 138  K 4.0 2.7*  --  3.2* 3.9  CL 109 105  --  109 105  CO2 25 26  --  25 23  GLUCOSE 84 83  --  94 66  BUN 10 9  --  5* 5*  CREATININE 0.52 0.99  --  0.56 0.56  CALCIUM 8.5* 8.1*  --  8.1* 8.7*  MG  --   --  2.0  --  1.9   Liver Function Tests: Recent Labs  Lab 09/12/17 2331 09/14/17 0249  AST 16 13*  ALT <5* 9*  ALKPHOS 94 91  BILITOT 0.4 0.5  PROT 6.0* 5.2*  ALBUMIN 3.0* 2.5*   No results for input(s): LIPASE, AMYLASE in the last 168 hours. No results for input(s): AMMONIA in the last 168 hours. CBC: Recent Labs  Lab 09/08/17 1951 09/12/17 2331 09/13/17 1011 09/13/17 2059 09/14/17 0249 09/15/17 0608  WBC 8.7 7.9 6.4 5.8 5.1 4.3  NEUTROABS 5.1 4.3  --   --   --  2.4  HGB 8.0* 7.3* 7.8* 7.2* 7.1* 9.0*  HCT 26.0* 23.6* 25.4* 22.8* 22.4* 29.0*  MCV 65.5* 64.8* 66.7* 64.8* 65.3* 67.3*  PLT 294 257 250 238 255 241   Cardiac Enzymes: No results for input(s): CKTOTAL, CKMB, CKMBINDEX, TROPONINI in the last 168 hours. BNP: Invalid input(s): POCBNP CBG: No results for input(s): GLUCAP in the last 168 hours. D-Dimer No results for input(s): DDIMER in the last 72 hours. Hgb A1c No results for input(s): HGBA1C in the last 72 hours. Lipid Profile No results for input(s): CHOL, HDL, LDLCALC, TRIG, CHOLHDL, LDLDIRECT in the last 72 hours. Thyroid function studies No results for input(s): TSH,  T4TOTAL, T3FREE, THYROIDAB in the last 72 hours.  Invalid input(s): FREET3 Anemia work up Recent Labs    09/13/17 0849 09/13/17 1011 09/13/17 1012  VITAMINB12  --   --  154*  FOLATE  --   --  10.2  FERRITIN  --   --  4*  TIBC  --   --  375  IRON  --   --  10*  RETICCTPCT QUESTIONABLE RESULTS, RECOMMEND RECOLLECT TO VERIFY 1.2  --    Urinalysis    Component Value Date/Time   COLORURINE YELLOW 02/14/2017 [place] 02/14/2017 1558   LABSPEC 1.010 02/14/2017 1558   PHURINE 6.0 02/14/2017 1558   GLUCOSEU NEGATIVE 02/14/2017 1558   [place] 02/14/2017 1558   [place] 02/14/2017 1558   [place] 02/14/2017 1558   PROTEINUR NEGATIVE 02/14/2017 1558   UROBILINOGEN 1.0 02/09/2015 1228   NITRITE NEGATIVE 02/14/2017 1558   LEUKOCYTESUR NEGATIVE 02/14/2017 1558   Sepsis Labs Invalid input(s): PROCALCITONIN,  WBC,  LACTICIDVEN Microbiology No results found for this or any previous visit (from the past 240 hour(s)).   Time coordinating discharge: 45 minutes  SIGNED:   Tawni Millers, MD  [facility] 09/15/2017, 12:45 PM Pager [phone]  If 7PM-7AM, please contact night-coverage [email or web address] Password [email or web address]

## 2017-09-15 NOTE — Op Note (Signed)
[facility] Patient Name: Terri [name] Procedure Date: 09/15/2017 MRN: [id number] Attending MD: Clarene Essex , MD Date of Birth: Jan 25, 1976 CSN: [id number] Age: 42 Admit Type: Inpatient Procedure:                Upper GI endoscopy Indications:              Iron deficiency anemia, Heme positive stool Providers:                Clarene Essex, MD, Elmer Ramp. Tilden Dome, RN, [employer],                            Technician, Cathe Mons, CRNA Referring MD:              Medicines:                Propofol total dose 400 mg IV, 100 mg IV lidocaine Complications:            No immediate complications. Estimated Blood Loss:     Estimated blood loss: none. Procedure:                Pre-Anesthesia Assessment:                           - Prior to the procedure, a History and Physical                            was performed, and patient medications and                            allergies were reviewed. The patient's tolerance of                            previous anesthesia was also reviewed. The risks                            and benefits of the procedure and the sedation                            options and risks were discussed with the patient.                            All questions were answered, and informed consent                            was obtained. Prior Anticoagulants: The patient has                            taken aspirin, last dose was 2 days prior to                            procedure. ASA Grade Assessment: II - A patient                            with mild systemic disease. After reviewing the  risks and benefits, the patient was deemed in                            satisfactory condition to undergo the procedure.                           After obtaining informed consent, the endoscope was                            passed under direct vision. Throughout the                            procedure, the patient's blood pressure,  pulse, and                            oxygen saturations were monitored continuously. The                            [id number] ([id number]) scope was introduced through the                            mouth, and advanced to the afferent and efferent                            jejunal loops. The upper GI endoscopy was                            accomplished without difficulty. The patient                            tolerated the procedure well. Scope In: Scope Out: Findings:      The larynx was normal.      The examined esophagus was normal.      Evidence of a gastric bypass was found. A gastric pouch with a normal       size was found containing ulceration and a small fistula. The       gastrojejunal anastomosis was characterized by healthy appearing mucosa.       This was traversed. The pouch-to-jejunum limb measured 65 cm from       anastomosis and was characterized by healthy appearing mucosa. The       jejunojejunal anastomosis was characterized by healthy appearing mucosa.       The duodenum-to-jejunum limb was examined 5 cm from anastomosis and was       characterized by healthy appearing mucosa.      The examined jejunum was normal.      The exam was otherwise without abnormality. Impression:               - Normal larynx.                           - Normal esophagus.                           - Gastric bypass with a normal-sized pouch. The  pouch had a few superficial ulcerations as above                            and a small fistula Gastrojejunal anastomosis                            characterized by healthy appearing mucosa.                           - Normal examined jejunum. The efferent limb was                            short and normal without obvious abnormality- The                            examination was otherwise normal.                           - No specimens collected. Moderate Sedation:      N/A- Per Anesthesia Care Recommendation:            - Patient has a contact number available for                            emergencies. The signs and symptoms of potential                            delayed complications were discussed with the                            patient. Return to normal activities tomorrow.                            Written discharge instructions were provided to the                            patient.                           - Soft diet today. Hopefully can go home today                           - Continue present medications. Consider IV iron if                            oral iron is not absorbed and cautioned about                            aspirin nonsteroidals and avoiding illegal drugs                           - Return to GI clinic PRN.                           - Telephone GI clinic if symptomatic PRN. Procedure Code(s):        ---  Professional ---                           [id number], Esophagogastroduodenoscopy, flexible,                            transoral; diagnostic, including collection of                            specimen(s) by brushing or washing, when performed                            (separate procedure) Diagnosis Code(s):        --- Professional ---                           U04.54, Bariatric surgery status                           D50.9, Iron deficiency anemia, unspecified                           R19.5, Other fecal abnormalities CPT copyright 2017 [employer]. All rights reserved. The codes documented in this report are preliminary and upon coder review may  be revised to meet current compliance requirements. Clarene Essex, MD 09/15/2017 2:32:45 PM This report has been signed electronically. Number of Addenda: 0

## 2017-09-15 NOTE — Anesthesia Preprocedure Evaluation (Addendum)
Anesthesia Evaluation  Patient identified by MRN, date of birth, ID band Patient awake    Reviewed: Allergy & Precautions, NPO status , Patient's Chart, lab work & pertinent test results  Airway Mallampati: II  TM Distance: >3 FB Neck ROM: Full    Dental  (+) Missing, Chipped,    Pulmonary Current Smoker,    Pulmonary exam normal breath sounds clear to auscultation       Cardiovascular hypertension, Pt. on medications Normal cardiovascular exam Rhythm:Regular Rate:Normal  ECG: SR, rate 64   Neuro/Psych  Headaches, PSYCHIATRIC DISORDERS Anxiety Depression Bipolar Disorder  Neuromuscular disease    GI/Hepatic (+)     substance abuse  ,   Endo/Other  negative endocrine ROS  Renal/GU negative Renal ROS     Musculoskeletal negative musculoskeletal ROS (+) Chronic back pain   Abdominal   Peds  Hematology  (+) anemia ,   Anesthesia Other Findings  abdominal pain and anemia  Reproductive/Obstetrics                            Anesthesia Physical Anesthesia Plan  ASA: III  Anesthesia Plan: MAC   Post-op Pain Management:    Induction: Intravenous  PONV Risk Score and Plan: 1 and Propofol infusion and Treatment may vary due to age or medical condition  Airway Management Planned: Natural Airway  Additional Equipment:   Intra-op Plan:   Post-operative Plan:   Informed Consent: I have reviewed the patients History and Physical, chart, labs and discussed the procedure including the risks, benefits and alternatives for the proposed anesthesia with the patient or authorized representative who has indicated his/her understanding and acceptance.   Dental advisory given  Plan Discussed with: CRNA  Anesthesia Plan Comments:         Anesthesia Quick Evaluation

## 2017-09-15 NOTE — Progress Notes (Signed)
Patient verbalized understanding of discharge instructions. Patient is stable at discharge. Patient was given AVS. Patient's friend accompanied her out of hospital at discharge.

## 2017-09-16 ENCOUNTER — Encounter (HOSPITAL_COMMUNITY): Payer: Self-pay | Admitting: Gastroenterology

## 2017-09-19 LAB — CULTURE, BLOOD (ROUTINE X 2)
CULTURE: NO GROWTH
Culture: NO GROWTH
Special Requests: ADEQUATE
Special Requests: ADEQUATE

## 2017-09-23 ENCOUNTER — Other Ambulatory Visit: Payer: Self-pay

## 2017-09-23 ENCOUNTER — Emergency Department (HOSPITAL_COMMUNITY): Payer: Medicare Other

## 2017-09-23 ENCOUNTER — Emergency Department (HOSPITAL_COMMUNITY)
Admission: EM | Admit: 2017-09-23 | Discharge: 2017-09-23 | Disposition: A | Discharge status: Home / Self Care | Payer: Medicare Other | Attending: Emergency Medicine | Admitting: Emergency Medicine

## 2017-09-23 ENCOUNTER — Encounter (HOSPITAL_COMMUNITY): Payer: Self-pay

## 2017-09-23 DIAGNOSIS — F17210 Nicotine dependence, cigarettes, uncomplicated: Secondary | ICD-10-CM | POA: Diagnosis not present

## 2017-09-23 DIAGNOSIS — Z79899 Other long term (current) drug therapy: Secondary | ICD-10-CM | POA: Diagnosis not present

## 2017-09-23 DIAGNOSIS — F1110 Opioid abuse, uncomplicated: Secondary | ICD-10-CM | POA: Insufficient documentation

## 2017-09-23 DIAGNOSIS — K0889 Other specified disorders of teeth and supporting structures: Secondary | ICD-10-CM | POA: Diagnosis present

## 2017-09-23 DIAGNOSIS — L03211 Cellulitis of face: Secondary | ICD-10-CM | POA: Diagnosis not present

## 2017-09-23 DIAGNOSIS — I10 Essential (primary) hypertension: Secondary | ICD-10-CM | POA: Insufficient documentation

## 2017-09-23 LAB — BASIC METABOLIC PANEL
Anion gap: 9 (ref 5–15)
BUN: 9 mg/dL (ref 6–20)
CHLORIDE: 104 mmol/L (ref 101–111)
CO2: 26 mmol/L (ref 22–32)
CREATININE: 0.59 mg/dL (ref 0.44–1.00)
Calcium: 9.2 mg/dL (ref 8.9–10.3)
GFR calc non Af Amer: >60 mL/min (ref >60)
Glucose, Bld: 76 mg/dL (ref 65–99)
Potassium: 3.7 mmol/L (ref 3.5–5.1)
Sodium: 139 mmol/L (ref 135–145)

## 2017-09-23 LAB — CBC WITH DIFFERENTIAL/PLATELET
Basophils Absolute: 0.1 10*3/uL (ref 0.0–0.1)
Basophils Relative: 1 %
EOS PCT: 2 %
Eosinophils Absolute: 0.2 10*3/uL (ref 0.0–0.7)
HEMATOCRIT: 34.9 % — AB (ref 36.0–46.0)
HEMOGLOBIN: 11 g/dL — AB (ref 12.0–15.0)
LYMPHS ABS: 2.1 10*3/uL (ref 0.7–4.0)
Lymphocytes Relative: 24 %
MCH: 22.7 pg — ABNORMAL LOW (ref 26.0–34.0)
MCHC: 31.5 g/dL (ref 30.0–36.0)
MCV: 72.1 fL — ABNORMAL LOW (ref 78.0–100.0)
MONOS PCT: 8 %
Monocytes Absolute: 0.7 10*3/uL (ref 0.1–1.0)
NEUTROS ABS: 5.6 10*3/uL (ref 1.7–7.7)
Neutrophils Relative %: 65 %
Platelets: 290 10*3/uL (ref 150–400)
RBC: 4.84 MIL/uL (ref 3.87–5.11)
RDW: 26.9 % — ABNORMAL HIGH (ref 11.5–15.5)
WBC: 8.7 10*3/uL (ref 4.0–10.5)

## 2017-09-23 LAB — I-STAT CG4 LACTIC ACID, ED: LACTIC ACID, VENOUS: 0.55 mmol/L (ref 0.5–1.9)

## 2017-09-23 LAB — HCG, QUANTITATIVE, PREGNANCY

## 2017-09-23 MED ORDER — SODIUM CHLORIDE 0.9 % IJ SOLN
INTRAMUSCULAR | Status: AC
  Filled 2017-09-23: qty 50

## 2017-09-23 MED ORDER — CLINDAMYCIN PHOSPHATE 600 MG/50ML IV SOLN
600.0000 mg | Freq: Once | INTRAVENOUS | Status: AC
  Administered 2017-09-23: 600 mg via INTRAVENOUS
  Filled 2017-09-23: qty 50

## 2017-09-23 MED ORDER — CLINDAMYCIN HCL 150 MG PO CAPS: 450.0000 mg | ORAL_CAPSULE | Freq: Four times a day (QID) | ORAL | 0 refills | Status: DC

## 2017-09-23 MED ORDER — ACETAMINOPHEN 500 MG PO TABS
1000.0000 mg | ORAL_TABLET | Freq: Once | ORAL | Status: AC
  Administered 2017-09-23: 1000 mg via ORAL
  Filled 2017-09-23: qty 2

## 2017-09-23 MED ORDER — IOHEXOL 300 MG/ML  SOLN
75.0000 mL | Freq: Once | INTRAMUSCULAR | Status: AC | PRN
  Administered 2017-09-23: 75 mL via INTRAVENOUS

## 2017-09-23 NOTE — ED Provider Notes (Signed)
[facility] DEPT Provider Note   CSN: [id number] Arrival date & time: 09/23/17  1531     History   Chief Complaint Chief Complaint  Patient presents with  . Dental Pain    HPI Terri [name] is a 42 y.o. female with history of IV drug use, heroin abuse is here for evaluation of dental pain and facial swelling x2 to 3 days.  Pain is localized to anterior, lower jaw, nonradiating, severe 10/10 and constant.  No interventions PTA.  No modifying factors.  No associated fevers, chills, trismus.  Aggravated with palpation and eating.  Last heroin use 5 days ago per patient.  Of note, patient recently admitted for neck abscess that required incision and drainage by ENT.  States that she was compliant with 3 weeks of antibiotics.  HPI  Past Medical History:  Diagnosis Date  . Bipolar disorder ([place])   . Chronic back pain   . Hypertension   . Migraine headache   . Sciatica   . Stress incontinence     Patient Active Problem List   Diagnosis Date Noted  . GIB (gastrointestinal bleeding) 09/13/2017  . Apnea 09/13/2017  . Essential hypertension   . Cellulitis and abscess of neck 08/12/2017  . Cellulitis of submandibular region 08/10/2017  . Odontogenic infection of jaw 08/10/2017  . Cholecystitis 02/14/2017  . Opioid abuse with opioid-induced mood disorder ([place]) 01/25/2017  . Anxiety 02/11/2014  . Depression 07/12/2012    Past Surgical History:  Procedure Laterality Date  . ANKLE SURGERY    . BACK SURGERY    . CESAREAN SECTION    . CHOLECYSTECTOMY N/A 02/15/2017   Procedure: LAPAROSCOPIC CHOLECYSTECTOMY;  Surgeon: Clovis Riley, MD;  Location: [facility];  Service: General;  Laterality: N/A;  . ESOPHAGOGASTRODUODENOSCOPY (EGD) WITH PROPOFOL N/A 09/15/2017   Procedure: ESOPHAGOGASTRODUODENOSCOPY (EGD) WITH PROPOFOL;  Surgeon: Clarene Essex, MD;  Location: [facility];  Service: Endoscopy;  Laterality: N/A;  . FRACTURE SURGERY    . GASTRIC BYPASS    .  INCISION AND DRAINAGE OF PERITONSILLAR ABCESS Left 08/13/2017   Procedure: INCISION AND DRAINAGE OF LEFT NECK ABSCESS;  Surgeon: Melida Quitter, MD;  Location: [facility];  Service: ENT;  Laterality: Left;  . LAPAROSCOPIC LYSIS OF ADHESIONS  02/15/2017   Procedure: LAPAROSCOPIC LYSIS OF ADHESIONS;  Surgeon: Clovis Riley, MD;  Location: [facility];  Service: General;;  . LUMBAR [place]    . LUMBAR FUSION    . TUBAL LIGATION    . UPPER GI ENDOSCOPY N/A 02/15/2017   Procedure: UPPER GI ENDOSCOPY;  Surgeon: Clovis Riley, MD;  Location: [facility];  Service: General;  Laterality: N/A;     OB History   None      Home Medications    Prior to Admission medications   Medication Sig Start Date End Date Taking? Authorizing Provider  acetaminophen (TYLENOL) 500 MG tablet Take 1 tablet (500 mg total) by mouth every 6 (six) hours as needed for moderate pain. 09/15/17  Yes Arrien, Jimmy Picket, MD  albuterol (PROVENTIL HFA;VENTOLIN HFA) 108 (90 BASE) MCG/ACT inhaler Inhale 1-2 puffs into the lungs every 4 (four) hours as needed for wheezing or shortness of breath. 03/07/15  Yes Pisciotta, Elmyra Ricks, PA-C  amitriptyline (ELAVIL) 50 MG tablet Take 50 mg by mouth at bedtime. 08/24/17  Yes [provider]  amLODipine (NORVASC) 5 MG tablet Take 5 mg by mouth daily. 08/25/17  Yes [provider]  DULoxetine (CYMBALTA) 60 MG capsule Take  60 mg by mouth daily. 12/23/14  Yes [provider]  gabapentin (NEURONTIN) 300 MG capsule Take 300 mg by mouth 3 (three) times daily. 08/27/17  Yes [provider]  lisinopril-hydrochlorothiazide (PRINZIDE,ZESTORETIC) 10-12.5 MG tablet Take 1 tablet by mouth daily. 01/22/17  Yes [provider]  oxyCODONE-acetaminophen (PERCOCET/ROXICET) 5-325 MG tablet Take 1 tablet by mouth 2 (two) times daily as needed for pain. 08/30/17  Yes [provider]  pantoprazole (PROTONIX) 40 MG tablet Take 1 tablet (40 mg total) by mouth daily. 09/15/17  10/15/17 Yes Arrien, Jimmy Picket, MD  clindamycin (CLEOCIN) 150 MG capsule Take 3 capsules (450 mg total) by mouth every 6 (six) hours for 7 days. 09/23/17 09/30/17  Kinnie Feil, PA-C  ondansetron (ZOFRAN-ODT) 8 MG disintegrating tablet Take 8 mg by mouth every 8 (eight) hours as needed for nausea or vomiting.  08/20/17   [provider]    Family History Family History  Problem Relation Age of Onset  . Diabetes Mother   . Hypertension Mother   . Diabetes Father   . Hypertension Father     Social History Social History   Tobacco Use  . Smoking status: Current Every Day Smoker    Packs/day: 1.00    Types: Cigarettes  . Smokeless tobacco: Never Used  Substance Use Topics  . Alcohol use: No  . Drug use: Yes    Types: IV    Comment: Heroin     Allergies   Ketorolac tromethamine and Methocarbamol   Review of Systems Review of Systems  HENT: Positive for dental problem and facial swelling.   All other systems reviewed and are negative.    Physical Exam Updated Vital Signs BP (!) 127/91   Pulse 82   Temp 98.3 F (36.8 C) (Oral)   Resp 18   Ht 5\' 10"  (1.778 m)   Wt 86.2 kg (190 lb)   LMP 09/01/2017   SpO2 90%   BMI 27.26 kg/m   Physical Exam  Constitutional: She is oriented to person, place, and time. She appears well-developed and well-nourished. No distress.  Somnolent.  Keeps eyes closed during conversation.  HENT:  Head: Normocephalic and atraumatic.  Nose: Nose normal.  Mouth/Throat: No oropharyngeal exudate.  focal area of edema, fluctuance, tenderness to left lower anterior mandible, no erythema.  No trismus.  Poor dentition throughout.  No focal fluctuance, edema, bleeding from gumlines.  No drooling, stridor.  Eyes: Pupils are equal, round, and reactive to light. Conjunctivae and EOM are normal.  3 mm pupils bilaterally, reactive  Neck: Normal range of motion.  No anterior neck edema or tenderness.  Full passive range of motion of neck  without pain.  Cardiovascular: Normal rate, regular rhythm and intact distal pulses.  No murmur heard. 2+ DP and radial pulses bilaterally. No LE edema.   Pulmonary/Chest: Effort normal and breath sounds normal. No respiratory distress. She has no wheezes. She has no rales.  Abdominal: Soft. Bowel sounds are normal. There is no tenderness.  No G/R/R. No suprapubic or CVA tenderness.   Musculoskeletal: Normal range of motion. She exhibits no deformity.  Neurological: She is alert and oriented to person, place, and time.  Skin: Skin is warm and dry. Capillary refill takes less than 2 seconds.  Multiple track marks to upper extremities.  1 x 1 cm ulcerated lesion to right thumb MCP, nontender without erythema, fluctuance, drainage.  Psychiatric: She has a normal mood and affect. Her behavior is normal. Judgment and thought content  normal.  Nursing note and vitals reviewed.      ED Treatments / Results  Labs (all labs ordered are listed, but only abnormal results are displayed) Labs Reviewed  CBC WITH DIFFERENTIAL/PLATELET - Abnormal; Notable for the following components:      Result Value   Hemoglobin 11.0 (*)    HCT 34.9 (*)    MCV 72.1 (*)    MCH 22.7 (*)    RDW 26.9 (*)    All other components within normal limits  CULTURE, BLOOD (ROUTINE X 2)  CULTURE, BLOOD (ROUTINE X 2)  BASIC METABOLIC PANEL  HCG, QUANTITATIVE, PREGNANCY  I-STAT CG4 LACTIC ACID, ED    EKG None  Radiology Ct Maxillofacial W Contrast  Result Date: 09/23/2017 CLINICAL DATA:  Initial evaluation for acute jaw pain with swelling. EXAM: CT MAXILLOFACIAL WITH CONTRAST TECHNIQUE: Multidetector CT imaging of the maxillofacial structures was performed with intravenous contrast. Multiplanar CT image reconstructions were also generated. CONTRAST:  61mL OMNIPAQUE IOHEXOL 300 MG/ML  SOLN COMPARISON:  Prior CT from 08/12/2017. FINDINGS: Osseous: Examination markedly degraded by motion artifact. Multiple prior dental  extractions noted, with scattered caries within the remaining dentition. No osseous erosion to suggest osteomyelitis. Orbits: Visualized orbital soft tissues within normal limits. Sinuses: Visualized sinuses are largely clear. Mastoid air cells and middle ear cavities are clear. Soft tissues: Soft tissue swelling with inflammatory stranding present at the left chin, adjacent to the left mandibular body, suspicious for possible cellulitis, although a component could reflect residual postoperative changes from recent surgery. Extension into the submental region without obvious extension into the floor of mouth. Overlying skin thickening. No discrete or drainable fluid collections identified. No extension of inflammatory changes into the deeper spaces of the visualized face and neck at this time. Mildly enlarged left level IB lymph nodes noted, likely reactive. Limited intracranial: Unremarkable. IMPRESSION: 1. Somewhat limited exam due to motion artifact. 2. Asymmetric soft tissue swelling with edema and inflammatory stranding at the left chin, suspicious for recurrent and/or residual infection/cellulitis. No abscess or drainable fluid collection identified. No extension into the floor of mouth or deeper spaces of the face/neck at this time. Electronically Signed   By: Jeannine Boga M.D.   On: 09/23/2017 19:54    Procedures Procedures (including critical care time)  Medications Ordered in ED Medications  sodium chloride 0.9 % injection (has no administration in time range)  clindamycin (CLEOCIN) IVPB 600 mg (600 mg Intravenous New Bag/Given 09/23/17 2139)  acetaminophen (TYLENOL) tablet 1,000 mg (1,000 mg Oral Given 09/23/17 1935)  iohexol (OMNIPAQUE) 300 MG/ML solution 75 mL (75 mLs Intravenous Contrast Given 09/23/17 1902)     Initial Impression / Assessment and Plan / ED Course  I have reviewed the triage vital signs and the nursing notes.  Pertinent labs & imaging results that were available  during my care of the patient were reviewed by me and considered in my medical decision making (see chart for details).  Clinical Course as of Sep 23 2156  Fri Sep 23, 2017  2015 CT Maxillofacial W Contrast [CG]  2015 Soft tissues: Soft tissue swelling with inflammatory stranding present at the left chin, adjacent to the left mandibular body, suspicious for possible cellulitis, although a component could reflect residual postoperative changes from recent surgery. Extension into the submental region without obvious extension into the floor of mouth. Overlying skin thickening. No discrete or drainable fluid collections identified. No extension of inflammatory changes into the deeper spaces of the visualized face and neck  at this time. Mildly enlarged left level IB lymph nodes noted, likely reactive.    [CG]    Clinical Course User Index [CG] Kinnie Feil, PA-C   Concern for abscess vs cellulitis. Given high risk, IVDU, recent h/o neck abscess requiring I&D in OR will obtain basic labs and imaging.   CT shows possible cellulitis with extension into submental region with reactive lymphadenopathy.  Will consult ENT. Clindamycin IV ordered.   Final Clinical Impressions(s) / ED Diagnoses   2155: Spoke to ENT (Dr. Benjamine Mola) who agrees with discharge with clindamycin and f/u in office in 1 week.  Pt to return to ER in 2-3 days if symptoms do not improve despite abx or worsen, fevers, trismus, etc. Spoke to pt regarding importance of compliance with abx as outpatient.  States she will get abx and take them.  Final diagnoses:  Facial cellulitis    ED Discharge Orders        Ordered    clindamycin (CLEOCIN) 150 MG capsule  Every 6 hours     09/23/17 2158       Arlean Hopping 09/23/17 2158    Charlesetta Shanks, MD 09/26/17 1413

## 2017-09-23 NOTE — ED Notes (Addendum)
Pt verbally gave permission to call Heaven Cisnero-her daughter or Jake Michaelis to pick her up from the hospital. [place] and left a voicemail. Called Glennie Hawk and he stated the he gave the pt money for a cab. Relayed the message to pt and pt has money in her pocket. We will arrange a cab service to pick up pt.

## 2017-09-23 NOTE — ED Notes (Signed)
Pt continues to be somnolent and is now tearful. Pt yelling at nurse to "Please let [her] go home!" and still not speaking at a normal rate. Pt stated "I would take anything to make this pain go away."  Pt pulling at IV. RN attempted to get pt to wait for the IV to come out as it is over halfway done. Pt stated "take this out right now."  RN stopped infusion and approximately 59ml's of cleocin left. Pt pulled at IV. RN took IV out per pt demand.

## 2017-09-23 NOTE — Discharge Instructions (Addendum)
Take clindamycin as prescribed and until fully completed.  Tylenol or ibuprofen for pain.  Apply moist heat (warm moist rag) to area.  Return to ER for difficulty opening jaw, swelling, redness, warmth, fevers despite antibiotics.

## 2017-09-23 NOTE — ED Notes (Signed)
Patient transported to CT 

## 2017-09-23 NOTE — ED Triage Notes (Signed)
Per daughter: Pt here because she is having jaw pain and swelling. Reports that pt had surgery about a month ago to remove infection in the jaw. Pt not really verbal with RN at this time, just groaning and turning. Pt also has a wound on her right hand that is in a circular pattern.

## 2017-09-23 NOTE — ED Notes (Signed)
Per daughter: Pt is reported to be using heroin and snorting it per the daughter. Pt is very lethargic and pt's daughter reports she is unsure when her mom last used. Pt is in a CPS case currently and it has been making her a danger to herself and others. Pt is losing custody of her daughter and it has been making her emotionally unstable per the daughters  Pt's daughter's number is  Sarahjane Matherly [phone] Pt's Spouse: Glennie Hawk [phone]

## 2017-09-24 ENCOUNTER — Emergency Department (HOSPITAL_COMMUNITY)
Admission: EM | Admit: 2017-09-24 | Discharge: 2017-09-25 | Disposition: A | Discharge status: Home / Self Care | Payer: Medicare Other | Attending: Emergency Medicine | Admitting: Emergency Medicine

## 2017-09-24 ENCOUNTER — Encounter (HOSPITAL_COMMUNITY): Payer: Self-pay

## 2017-09-24 ENCOUNTER — Emergency Department (HOSPITAL_COMMUNITY): Payer: Medicare Other

## 2017-09-24 ENCOUNTER — Other Ambulatory Visit: Payer: Self-pay

## 2017-09-24 DIAGNOSIS — F17210 Nicotine dependence, cigarettes, uncomplicated: Secondary | ICD-10-CM | POA: Insufficient documentation

## 2017-09-24 DIAGNOSIS — I10 Essential (primary) hypertension: Secondary | ICD-10-CM | POA: Diagnosis not present

## 2017-09-24 DIAGNOSIS — K047 Periapical abscess without sinus: Secondary | ICD-10-CM | POA: Diagnosis not present

## 2017-09-24 DIAGNOSIS — K0889 Other specified disorders of teeth and supporting structures: Secondary | ICD-10-CM | POA: Diagnosis present

## 2017-09-24 DIAGNOSIS — F1910 Other psychoactive substance abuse, uncomplicated: Secondary | ICD-10-CM

## 2017-09-24 DIAGNOSIS — Z79899 Other long term (current) drug therapy: Secondary | ICD-10-CM | POA: Diagnosis not present

## 2017-09-24 DIAGNOSIS — F19129 Other psychoactive substance abuse with intoxication, unspecified: Secondary | ICD-10-CM | POA: Insufficient documentation

## 2017-09-24 LAB — CBC WITH DIFFERENTIAL/PLATELET
Basophils Absolute: 0.1 10*3/uL (ref 0.0–0.1)
Basophils Relative: 1 %
EOS ABS: 0.1 10*3/uL (ref 0.0–0.7)
Eosinophils Relative: 2 %
HEMATOCRIT: 30.4 % — AB (ref 36.0–46.0)
Hemoglobin: 9.7 g/dL — ABNORMAL LOW (ref 12.0–15.0)
LYMPHS ABS: 2.1 10*3/uL (ref 0.7–4.0)
Lymphocytes Relative: 31 %
MCH: 22.9 pg — ABNORMAL LOW (ref 26.0–34.0)
MCHC: 31.9 g/dL (ref 30.0–36.0)
MCV: 71.9 fL — AB (ref 78.0–100.0)
MONO ABS: 0.7 10*3/uL (ref 0.1–1.0)
Monocytes Relative: 10 %
NEUTROS ABS: 3.8 10*3/uL (ref 1.7–7.7)
Neutrophils Relative %: 56 %
Platelets: 265 10*3/uL (ref 150–400)
RBC: 4.23 MIL/uL (ref 3.87–5.11)
RDW: 27.2 % — ABNORMAL HIGH (ref 11.5–15.5)
WBC: 6.8 10*3/uL (ref 4.0–10.5)

## 2017-09-24 LAB — BASIC METABOLIC PANEL
ANION GAP: 8 (ref 5–15)
BUN: 7 mg/dL (ref 6–20)
CALCIUM: 8.7 mg/dL — AB (ref 8.9–10.3)
CHLORIDE: 102 mmol/L (ref 101–111)
CO2: 27 mmol/L (ref 22–32)
Creatinine, Ser: 0.53 mg/dL (ref 0.44–1.00)
GFR calc non Af Amer: >60 mL/min (ref >60)
GLUCOSE: 93 mg/dL (ref 65–99)
POTASSIUM: 3.9 mmol/L (ref 3.5–5.1)
Sodium: 137 mmol/L (ref 135–145)

## 2017-09-24 LAB — I-STAT CG4 LACTIC ACID, ED: Lactic Acid, Venous: 0.76 mmol/L (ref 0.5–1.9)

## 2017-09-24 LAB — RAPID URINE DRUG SCREEN, HOSP PERFORMED
AMPHETAMINES: NOT DETECTED
BARBITURATES: NOT DETECTED
BENZODIAZEPINES: POSITIVE — AB
Cocaine: NOT DETECTED
OPIATES: POSITIVE — AB
Tetrahydrocannabinol: NOT DETECTED

## 2017-09-24 MED ORDER — NALOXONE HCL 4 MG/0.1ML NA LIQD
0.4000 mg | Freq: Once | NASAL | Status: AC
  Administered 2017-09-24: 0.4 mg via NASAL

## 2017-09-24 MED ORDER — CLINDAMYCIN HCL 150 MG PO CAPS: 450.0000 mg | ORAL_CAPSULE | Freq: Four times a day (QID) | ORAL | 0 refills | Status: AC

## 2017-09-24 MED ORDER — CLINDAMYCIN PHOSPHATE 900 MG/50ML IV SOLN
900.0000 mg | Freq: Once | INTRAVENOUS | Status: AC
  Administered 2017-09-24: 900 mg via INTRAVENOUS
  Filled 2017-09-24: qty 50

## 2017-09-24 MED ORDER — IOPAMIDOL (ISOVUE-300) INJECTION 61%
INTRAVENOUS | Status: AC
  Filled 2017-09-24: qty 100

## 2017-09-24 MED ORDER — IOPAMIDOL (ISOVUE-300) INJECTION 61%
75.0000 mL | Freq: Once | INTRAVENOUS | Status: AC | PRN
  Administered 2017-09-24: 75 mL via INTRAVENOUS

## 2017-09-24 NOTE — ED Provider Notes (Addendum)
[facility] DEPT Provider Note   CSN: [id number] Arrival date & time: 09/24/17  1259     History   Chief Complaint Chief Complaint  Patient presents with  . Abscess    HPI Terri [name] is a 42 y.o. female.  HPI 42 yo F with bipolar d/o, chronic drug use and recurrent dental infections, here with ongoing dental pain. Pt was reportedly just seen in the ED yesterday - had U/S showing dental cellulitis and was given abx and sent home. She has not filled her meds. Reportedly, she called out EMS due to dental pain and was found drowsy, intoxicated. She was told to come to the ED for re-assessment. She feels like her pain is mildly worse. No difficulty swallowing. No difficulty breathing. No change in voice. Pt intoxicated, drowsy on arrival, however, limiting initial history.  Level 5 caveat invoked as remainder of history, ROS, and physical exam limited due to patient's intoxication.    Past Medical History:  Diagnosis Date  . Bipolar disorder ([place])   . Chronic back pain   . Hypertension   . Migraine headache   . Sciatica   . Stress incontinence     Patient Active Problem List   Diagnosis Date Noted  . GIB (gastrointestinal bleeding) 09/13/2017  . Apnea 09/13/2017  . Essential hypertension   . Cellulitis and abscess of neck 08/12/2017  . Cellulitis of submandibular region 08/10/2017  . Odontogenic infection of jaw 08/10/2017  . Cholecystitis 02/14/2017  . Opioid abuse with opioid-induced mood disorder ([place]) 01/25/2017  . Anxiety 02/11/2014  . Depression 07/12/2012    Past Surgical History:  Procedure Laterality Date  . ANKLE SURGERY    . BACK SURGERY    . CESAREAN SECTION    . CHOLECYSTECTOMY N/A 02/15/2017   Procedure: LAPAROSCOPIC CHOLECYSTECTOMY;  Surgeon: Clovis Riley, MD;  Location: [facility];  Service: General;  Laterality: N/A;  . ESOPHAGOGASTRODUODENOSCOPY (EGD) WITH PROPOFOL N/A 09/15/2017   Procedure:  ESOPHAGOGASTRODUODENOSCOPY (EGD) WITH PROPOFOL;  Surgeon: Clarene Essex, MD;  [facility];  Service: Endoscopy;  Laterality: N/A;  . FRACTURE SURGERY    . GASTRIC BYPASS    . INCISION AND DRAINAGE OF PERITONSILLAR ABCESS Left 08/13/2017   Procedure: INCISION AND DRAINAGE OF LEFT NECK ABSCESS;  Surgeon: Melida Quitter, MD;  Location: [facility];  Service: ENT;  Laterality: Left;  . LAPAROSCOPIC LYSIS OF ADHESIONS  02/15/2017   Procedure: LAPAROSCOPIC LYSIS OF ADHESIONS;  Surgeon: Clovis Riley, MD;  Location: [facility];  Service: General;;  . LUMBAR [place]    . LUMBAR FUSION    . TUBAL LIGATION    . UPPER GI ENDOSCOPY N/A 02/15/2017   Procedure: UPPER GI ENDOSCOPY;  Surgeon: Clovis Riley, MD;  Location: [facility];  Service: General;  Laterality: N/A;     OB History   None      Home Medications    Prior to Admission medications   Medication Sig Start Date End Date Taking? Authorizing Provider  acetaminophen (TYLENOL) 500 MG tablet Take 1 tablet (500 mg total) by mouth every 6 (six) hours as needed for moderate pain. 09/15/17   Arrien, Jimmy Picket, MD  albuterol (PROVENTIL HFA;VENTOLIN HFA) 108 (90 BASE) MCG/ACT inhaler Inhale 1-2 puffs into the lungs every 4 (four) hours as needed for wheezing or shortness of breath. 03/07/15   Pisciotta, Elmyra Ricks, PA-C  amitriptyline (ELAVIL) 50 MG tablet Take 50 mg by mouth at bedtime. 08/24/17   [provider]  amLODipine (NORVASC) 5 MG tablet Take 5 mg by mouth daily. 08/25/17   [provider]  clindamycin (CLEOCIN) 150 MG capsule Take 3 capsules (450 mg total) by mouth every 6 (six) hours for 7 days. 09/23/17 09/30/17  Kinnie Feil, PA-C  DULoxetine (CYMBALTA) 60 MG capsule Take 60 mg by mouth daily. 12/23/14   [provider]  gabapentin (NEURONTIN) 300 MG capsule Take 300 mg by mouth 3 (three) times daily. 08/27/17   [provider]  lisinopril-hydrochlorothiazide (PRINZIDE,ZESTORETIC) 10-12.5 MG tablet  Take 1 tablet by mouth daily. 01/22/17   [provider]  ondansetron (ZOFRAN-ODT) 8 MG disintegrating tablet Take 8 mg by mouth every 8 (eight) hours as needed for nausea or vomiting.  08/20/17   [provider]  oxyCODONE-acetaminophen (PERCOCET/ROXICET) 5-325 MG tablet Take 1 tablet by mouth 2 (two) times daily as needed for pain. 08/30/17   [provider]  pantoprazole (PROTONIX) 40 MG tablet Take 1 tablet (40 mg total) by mouth daily. 09/15/17 10/15/17  Arrien, Jimmy Picket, MD    Family History Family History  Problem Relation Age of Onset  . Diabetes Mother   . Hypertension Mother   . Diabetes Father   . Hypertension Father     Social History Social History   Tobacco Use  . Smoking status: Current Every Day Smoker    Packs/day: 1.00    Types: Cigarettes  . Smokeless tobacco: Never Used  Substance Use Topics  . Alcohol use: No  . Drug use: Yes    Types: IV    Comment: Heroin     Allergies   Ketorolac tromethamine and Methocarbamol   Review of Systems Review of Systems  Unable to perform ROS: Mental status change     Physical Exam Updated Vital Signs BP (!) 124/105 (BP Location: Left Arm)   Pulse 80   Temp 98.1 F (36.7 C) (Oral)   Resp 20   Ht 5\' 11"  (1.803 m)   Wt 84.4 kg (186 lb)   LMP 09/01/2017   SpO2 97%   BMI 25.94 kg/m   Physical Exam  Constitutional: She is oriented to person, place, and time. She appears well-developed and well-nourished. No distress.  HENT:  Head: Normocephalic and atraumatic.  Diffuse poor dentition. Left lower gingival disease w/o discrete abscess. Moderate TTP over submandibular spaces b/l without edema. No apparent sublingual edema, induration, or fluctuance. No purulence. OP widely patent. Tolerating secretions.  Eyes: Conjunctivae are normal.  Neck: Neck supple.  Cardiovascular: Normal rate, regular rhythm and normal heart sounds. Exam reveals no friction rub.  No murmur  heard. Pulmonary/Chest: Effort normal and breath sounds normal. No respiratory distress. She has no wheezes. She has no rales.  Abdominal: She exhibits no distension.  Musculoskeletal: She exhibits no edema.  Neurological: She is alert and oriented to person, place, and time. She exhibits normal muscle tone.  Skin: Skin is warm. Capillary refill takes less than 2 seconds.  Psychiatric: She has a normal mood and affect.  Nursing note and vitals reviewed.    ED Treatments / Results  Labs (all labs ordered are listed, but only abnormal results are displayed) Labs Reviewed  CBC WITH DIFFERENTIAL/PLATELET - Abnormal; Notable for the following components:      Result Value   Hemoglobin 9.7 (*)    HCT 30.4 (*)    MCV 71.9 (*)    MCH 22.9 (*)    RDW 27.2 (*)    All other components within normal limits  BASIC METABOLIC PANEL - Abnormal; Notable for the following components:   Calcium 8.7 (*)    All other components within normal limits  RAPID URINE DRUG SCREEN, HOSP PERFORMED - Abnormal; Notable for the following components:   Opiates POSITIVE (*)    Benzodiazepines POSITIVE (*)    All other components within normal limits  CULTURE, BLOOD (ROUTINE X 2)  CULTURE, BLOOD (ROUTINE X 2)  I-STAT CG4 LACTIC ACID, ED  I-STAT CG4 LACTIC ACID, ED    EKG None  Radiology Ct Maxillofacial W Contrast  Result Date: 09/23/2017 CLINICAL DATA:  Initial evaluation for acute jaw pain with swelling. EXAM: CT MAXILLOFACIAL WITH CONTRAST TECHNIQUE: Multidetector CT imaging of the maxillofacial structures was performed with intravenous contrast. Multiplanar CT image reconstructions were also generated. CONTRAST:  44mL OMNIPAQUE IOHEXOL 300 MG/ML  SOLN COMPARISON:  Prior CT from 08/12/2017. FINDINGS: Osseous: Examination markedly degraded by motion artifact. Multiple prior dental extractions noted, with scattered caries within the remaining dentition. No osseous erosion to suggest osteomyelitis. Orbits:  Visualized orbital soft tissues within normal limits. Sinuses: Visualized sinuses are largely clear. Mastoid air cells and middle ear cavities are clear. Soft tissues: Soft tissue swelling with inflammatory stranding present at the left chin, adjacent to the left mandibular body, suspicious for possible cellulitis, although a component could reflect residual postoperative changes from recent surgery. Extension into the submental region without obvious extension into the floor of mouth. Overlying skin thickening. No discrete or drainable fluid collections identified. No extension of inflammatory changes into the deeper spaces of the visualized face and neck at this time. Mildly enlarged left level IB lymph nodes noted, likely reactive. Limited intracranial: Unremarkable. IMPRESSION: 1. Somewhat limited exam due to motion artifact. 2. Asymmetric soft tissue swelling with edema and inflammatory stranding at the left chin, suspicious for recurrent and/or residual infection/cellulitis. No abscess or drainable fluid collection identified. No extension into the floor of mouth or deeper spaces of the face/neck at this time. Electronically Signed   By: Jeannine Boga M.D.   On: 09/23/2017 19:54    Procedures Procedures (including critical care time)  Medications Ordered in ED Medications  iopamidol (ISOVUE-300) 61 % injection 75 mL (has no administration in time range)  iopamidol (ISOVUE-300) 61 % injection (has no administration in time range)  clindamycin (CLEOCIN) IVPB 900 mg (0 mg Intravenous Stopped 09/24/17 1600)  naloxone (NARCAN) nasal spray 4 mg/0.1 mL (0.4 mg Nasal Provided for home use 09/24/17 1435)     Initial Impression / Assessment and Plan / ED Course  I have reviewed the triage vital signs and the nursing notes.  Pertinent labs & imaging results that were available during my care of the patient were reviewed by me and considered in my medical decision making (see chart for details).      42 yo F here with heroin intoxication, dental pain. Recent ED visit reviewed. Regarding her heroin use, pt given narcan with mild improvement. She seems to be improving clinically - will monitor for sobriety.  Regarding her dental pain, this is complicated by recent admission for I&D of deep neck abscess/infection. CT yesterday did show submandibular space involvement. While she has a normal LA and WBC today, unfortunately given her complex history and difficulty history taking, will repeat CT. IV Clinda given.  Patient care transferred to Dr. Rogene Houston at the end of my shift. Patient presentation, ED course, and plan of care discussed with review of all pertinent labs and imaging. Please see his/her note for further  details regarding further ED course and disposition.  If CT neg and pt sober, can likely d/c.  Final Clinical Impressions(s) / ED Diagnoses   Final diagnoses:  Dental infection  Polysubstance abuse [facility])    ED Discharge Orders    None       Duffy Bruce, MD 09/24/17 Altha Harm    Duffy Bruce, MD 10/03/17 1126

## 2017-09-24 NOTE — ED Notes (Signed)
Unable to assess A&O. Appears to be disoriented with restlessness and thrashing in the bed. Sitter close for safety.

## 2017-09-24 NOTE — Discharge Instructions (Signed)
CT scan shows evidence of a dental abscess with cellulitis overlying the jaw area.  Take the antibiotic as directed.  Give oral surgery a call for follow-up.

## 2017-09-24 NOTE — ED Notes (Signed)
Pt is in bed, moving around a lot, but not as restless and thrashing as earlier.

## 2017-09-24 NOTE — ED Provider Notes (Signed)
Results for orders placed or performed during the hospital encounter of 09/24/17  CBC with Differential  Result Value Ref Range   WBC 6.8 4.0 - 10.5 K/uL   RBC 4.23 3.87 - 5.11 MIL/uL   Hemoglobin 9.7 (L) 12.0 - 15.0 g/dL   HCT 30.4 (L) 36.0 - 46.0 %   MCV 71.9 (L) 78.0 - 100.0 fL   MCH 22.9 (L) 26.0 - 34.0 pg   MCHC 31.9 30.0 - 36.0 g/dL   RDW 27.2 (H) 11.5 - 15.5 %   Platelets 265 150 - 400 K/uL   Neutrophils Relative % 56 %   Lymphocytes Relative 31 %   Monocytes Relative 10 %   Eosinophils Relative 2 %   Basophils Relative 1 %   Neutro Abs 3.8 1.7 - 7.7 K/uL   Lymphs Abs 2.1 0.7 - 4.0 K/uL   Monocytes Absolute 0.7 0.1 - 1.0 K/uL   Eosinophils Absolute 0.1 0.0 - 0.7 K/uL   Basophils Absolute 0.1 0.0 - 0.1 K/uL   Smear Review MORPHOLOGY UNREMARKABLE   Basic metabolic panel  Result Value Ref Range   Sodium 137 135 - 145 mmol/L   Potassium 3.9 3.5 - 5.1 mmol/L   Chloride 102 101 - 111 mmol/L   CO2 27 22 - 32 mmol/L   Glucose, Bld 93 65 - 99 mg/dL   BUN 7 6 - 20 mg/dL   Creatinine, Ser 0.53 0.44 - 1.00 mg/dL   Calcium 8.7 (L) 8.9 - 10.3 mg/dL   GFR calc non Af Amer >60 >60 mL/min   GFR calc Af Amer >60 >60 mL/min   Anion gap 8 5 - 15  Rapid urine drug screen (hospital performed)  Result Value Ref Range   Opiates POSITIVE (A) NONE DETECTED   Cocaine NONE DETECTED NONE DETECTED   Benzodiazepines POSITIVE (A) NONE DETECTED   Amphetamines NONE DETECTED NONE DETECTED   Tetrahydrocannabinol NONE DETECTED NONE DETECTED   Barbiturates NONE DETECTED NONE DETECTED  I-Stat CG4 Lactic Acid, ED  Result Value Ref Range   Lactic Acid, Venous 0.76 0.5 - 1.9 mmol/L   Ct Soft Tissue Neck W Contrast  Result Date: 09/24/2017 CLINICAL DATA:  42 y/o F; sore throat, stridor, epiglottitis or tonsillitis suspected. EXAM: CT NECK WITH CONTRAST TECHNIQUE: Multidetector CT imaging of the neck was performed using the standard protocol following the bolus administration of intravenous contrast.  CONTRAST:  13mL ISOVUE-300 IOPAMIDOL (ISOVUE-300) INJECTION 61% COMPARISON:  08/10/2017 CT neck FINDINGS: Pharynx and larynx: Normal. No mass or swelling. Salivary glands: No inflammation, mass, or stone. Thyroid: Normal. Lymph nodes: Left-sided submandibular lymphadenopathy without lymph node necrosis, likely reactive. Vascular: Negative. Limited intracranial: Negative. Visualized orbits: Negative. Mastoids and visualized paranasal sinuses: Clear. Skeleton: Mild cervical spondylosis with multilevel disc and facet degenerative changes. No high-grade bony canal stenosis. Severe dental disease with multiple absent teeth and dental caries. Upper chest: Negative. Other: There is inflammation in the left submandibular compartment and in the left facial soft tissue overlying the left mandible from symphysis 2 angle. IMPRESSION: 1. Inflammation in left submandibular compartment and in left facial superficial soft tissues overlying left mandible from symphysis to angle compatible with cellulitis. No rim enhancing abscess. Given the location, infection is probably odontogenic, similar prior CT neck. 2. Left submandibular lymphadenopathy, likely reactive. No lymph node necrosis. Electronically Signed   By: Kristine Garbe M.D.   On: 09/24/2017 23:41   Ct Abdomen Pelvis W Contrast  Result Date: 09/13/2017 CLINICAL DATA:  Apnea.  History of  IV drug abuse.  Anemia. EXAM: CT ABDOMEN AND PELVIS WITH CONTRAST TECHNIQUE: Multidetector CT imaging of the abdomen and pelvis was performed using the standard protocol following bolus administration of intravenous contrast. CONTRAST:  164mL ISOVUE-300 IOPAMIDOL (ISOVUE-300) INJECTION 61% COMPARISON:  December 28, 2013 FINDINGS: Lower chest: Bibasilar atelectasis. No other abnormalities in the lung bases. Hepatobiliary: A small cyst is seen in the lateral right hepatic lobe, unchanged since 2015. No suspicious liver masses. Previous cholecystectomy. The portal vein is patent. Mild  periportal edema, nonspecific. Pancreas: Unremarkable. No pancreatic ductal dilatation or surrounding inflammatory changes. Spleen: Normal in size without focal abnormality. Adrenals/Urinary Tract: The adrenal glands and kidneys are normal. No ureteral stones. There is a small dense nodule projected over the posterior bladder to the right best seen on sagittal image 74 and axial image 80 measuring up to 7 mm. The bladder is otherwise normal. Stomach/Bowel: Patient is status post gastric bypass. The stomach is otherwise normal. The duodenum and small bowel are unremarkable. Mild fecal loading in the colon. The colon is otherwise normal. The appendix is normal. Vascular/Lymphatic: Atherosclerotic changes are seen in the abdominal aorta, advanced for age. No aneurysm or dissection. No adenopathy. Shotty nodes in the retroperitoneum may be reactive. Reproductive: Uterus and bilateral adnexa are unremarkable. Other: The subcutaneous fat appears edematous. No free air or free fluid. Musculoskeletal: Degenerative changes in the spine. IMPRESSION: 1. There is a small dense nodule projected over the posterior bladder. This is favored to be within the bladder, suspicious for a small mass/neoplasm. Recommend cystoscopy. 2. Subcutaneous edema. 3. Atherosclerotic changes in the abdominal aorta, and veins for age. 4. Shotty nodes in the retroperitoneum are likely reactive. 5. Degenerative changes in the spine. Electronically Signed   By: Dorise Bullion III M.D   On: 09/13/2017 19:55   Ct Maxillofacial W Contrast  Result Date: 09/23/2017 CLINICAL DATA:  Initial evaluation for acute jaw pain with swelling. EXAM: CT MAXILLOFACIAL WITH CONTRAST TECHNIQUE: Multidetector CT imaging of the maxillofacial structures was performed with intravenous contrast. Multiplanar CT image reconstructions were also generated. CONTRAST:  55mL OMNIPAQUE IOHEXOL 300 MG/ML  SOLN COMPARISON:  Prior CT from 08/12/2017. FINDINGS: Osseous: Examination  markedly degraded by motion artifact. Multiple prior dental extractions noted, with scattered caries within the remaining dentition. No osseous erosion to suggest osteomyelitis. Orbits: Visualized orbital soft tissues within normal limits. Sinuses: Visualized sinuses are largely clear. Mastoid air cells and middle ear cavities are clear. Soft tissues: Soft tissue swelling with inflammatory stranding present at the left chin, adjacent to the left mandibular body, suspicious for possible cellulitis, although a component could reflect residual postoperative changes from recent surgery. Extension into the submental region without obvious extension into the floor of mouth. Overlying skin thickening. No discrete or drainable fluid collections identified. No extension of inflammatory changes into the deeper spaces of the visualized face and neck at this time. Mildly enlarged left level IB lymph nodes noted, likely reactive. Limited intracranial: Unremarkable. IMPRESSION: 1. Somewhat limited exam due to motion artifact. 2. Asymmetric soft tissue swelling with edema and inflammatory stranding at the left chin, suspicious for recurrent and/or residual infection/cellulitis. No abscess or drainable fluid collection identified. No extension into the floor of mouth or deeper spaces of the face/neck at this time. Electronically Signed   By: Jeannine Boga M.D.   On: 09/23/2017 19:54   Dg Chest Port 1 View  Result Date: 09/13/2017 CLINICAL DATA:  Hypoxia EXAM: PORTABLE CHEST 1 VIEW COMPARISON:  June 17, 2017 FINDINGS:  There is no edema or consolidation. The heart size and pulmonary vascularity are normal. No adenopathy. No bone lesions. IMPRESSION: No edema or consolidation. Electronically Signed   By: Lowella Grip III M.D.   On: 09/13/2017 09:35   Dg Hand Complete Right  Result Date: 09/08/2017 CLINICAL DATA:  42 y/o F; wound between thumb and index finger. Rule out osteomyelitis. EXAM: RIGHT HAND - COMPLETE  3+ VIEW COMPARISON:  02/26/2014 right hand radiograph. FINDINGS: There is no evidence of fracture or dislocation. Stable lucent focus within the first distal phalanx likely representing an enchondroma. No lytic or destructive process of the bones. IMPRESSION: No findings of osteomyelitis. 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 Electronically Signed   By: Kristine Garbe M.D.   On: 09/08/2017 19:42   CT scan done here today shows left submandibular mandible area cellulitis.  Probably secondary to tooth abscess no significant large abscess.  There is some reactive nodes.  Patient will be continued on the clindamycin and follow-up with Dent history or oral surgery.   Fredia Sorrow, MD 09/24/17 [phone]

## 2017-09-24 NOTE — ED Notes (Signed)
Per CT staff, pt was not cooperative enough to do the CT scan. They are waiting until she is more alert.

## 2017-09-24 NOTE — ED Triage Notes (Addendum)
Pt is lethargic in triage, unable to finish full sentences without falling asleep, but aroused when name is called and able to answer question appropriately. Pt states her left mouth abscess is still hurting and not getting any better despite taking full course of abx.

## 2017-09-24 NOTE — ED Notes (Signed)
Patient up to bathroom with 2 person assist. Pt very unsteady and drowsy, falling asleep while sitting on the toilet. Pt having to be woken up in order to walk back to bed. Pt confused at this time and unable to consent for IV insertion.

## 2017-09-24 NOTE — ED Notes (Signed)
IV right upper forearm was removed by pt and thrown onto the floor. She would not allow this writer to assess the area.

## 2017-09-24 NOTE — ED Triage Notes (Signed)
PT BIB EMS FOR AN ABSCESS RECHECK TO THE LEFT JAW. PER EMS, [employer] ADVISED HER TO COME TO [place], OR SHE WOULD BE IN TROUBLE WITH THE LAW. PT TOOK HEROIN OVER AN HOUR AGO, AND IS VERY SLEEPY AND LETHARGIC. PT WS SEEN HERE LAST NIGHT FOR THE ABSCESS, AND HAS NOT FILLED THE PRESCRIPTIONS YET. PT ABLE TO ANSWER QUESTIONS APPROPRIATELY.

## 2017-09-24 NOTE — ED Notes (Signed)
Bed: WA27 Expected date:  Expected time:  Means of arrival:  Comments: 

## 2017-09-25 NOTE — ED Notes (Signed)
Patient lethargic and difficult to waken at this time. Pt wakens to touch and loud voice from staff and is unable to verbalize date and time. Dr. Rogene Houston notified; pt to be discharged per MD in the am when more alert and able to ambulate.

## 2017-09-28 LAB — CULTURE, BLOOD (ROUTINE X 2)
CULTURE: NO GROWTH
Culture: NO GROWTH
Special Requests: ADEQUATE
Special Requests: ADEQUATE

## 2017-09-29 LAB — CULTURE, BLOOD (ROUTINE X 2)
Culture: NO GROWTH
Special Requests: ADEQUATE

## 2017-10-11 ENCOUNTER — Encounter (HOSPITAL_COMMUNITY): Payer: Self-pay | Admitting: Emergency Medicine

## 2017-10-11 ENCOUNTER — Emergency Department (HOSPITAL_COMMUNITY)
Admission: EM | Admit: 2017-10-11 | Discharge: 2017-10-11 | Disposition: A | Discharge status: Home / Self Care | Payer: Medicare Other | Attending: Emergency Medicine | Admitting: Emergency Medicine

## 2017-10-11 DIAGNOSIS — F17210 Nicotine dependence, cigarettes, uncomplicated: Secondary | ICD-10-CM | POA: Insufficient documentation

## 2017-10-11 DIAGNOSIS — Z79899 Other long term (current) drug therapy: Secondary | ICD-10-CM | POA: Diagnosis not present

## 2017-10-11 DIAGNOSIS — R220 Localized swelling, mass and lump, head: Secondary | ICD-10-CM | POA: Diagnosis present

## 2017-10-11 DIAGNOSIS — I10 Essential (primary) hypertension: Secondary | ICD-10-CM | POA: Insufficient documentation

## 2017-10-11 MED ORDER — CLINDAMYCIN HCL 150 MG PO CAPS: 300.0000 mg | ORAL_CAPSULE | Freq: Four times a day (QID) | ORAL | 0 refills | Status: AC

## 2017-10-11 MED ORDER — TRAMADOL HCL 50 MG PO TABS
50.0000 mg | ORAL_TABLET | Freq: Once | ORAL | Status: AC
Start: 2017-10-11 — End: 2017-10-11
  Administered 2017-10-11: 50 mg via ORAL
  Filled 2017-10-11: qty 1

## 2017-10-11 MED ORDER — CLINDAMYCIN HCL 300 MG PO CAPS
300.0000 mg | ORAL_CAPSULE | Freq: Once | ORAL | Status: AC
  Administered 2017-10-11: 300 mg via ORAL
  Filled 2017-10-11: qty 1

## 2017-10-11 MED ORDER — TRAMADOL HCL 50 MG PO TABS: 50.0000 mg | ORAL_TABLET | Freq: Four times a day (QID) | ORAL | 0 refills | Status: DC | PRN

## 2017-10-11 NOTE — Discharge Instructions (Addendum)
Take the antibiotics as prescribed, follow up with your primary care doctor in the next week, consider seeing a dentis for further evaluation

## 2017-10-11 NOTE — ED Triage Notes (Signed)
Patient here from home with complaints of right sided facial swelling. Possible dental abscess. Ibuprofen with no relief.

## 2017-10-11 NOTE — ED Provider Notes (Signed)
[facility] DEPT Provider Note   CSN: [id number] Arrival date & time: 10/11/17  6433     History   Chief Complaint Chief Complaint  Patient presents with  . Dental Pain  . Facial Swelling    HPI Terri [name] is a 42 y.o. female.  HPI Patient presents to the emergency room for evaluation of facial swelling and pain.  Patient has a history of several medical problems including substance abuse and recurrent abscesses.  Patient was seen back in March of this year.  She had an I&D of a left neck abscess.  Patient returned to the emergency room earlier this month on May 3.  She had a CT scan then that did not show any evidence of any recurrent abscess but did show some soft tissue swelling and inflammation.  Patient was prescribed antibiotics.  Patient states the last couple of days she has had recurrent swelling on the right side of her face near her jaw.  She denies any fevers or chills.  No difficulty swallowing.  She has been taking ibuprofen without relief. Past Medical History:  Diagnosis Date  . Bipolar disorder ([place])   . Chronic back pain   . Hypertension   . Migraine headache   . Sciatica   . Stress incontinence     Patient Active Problem List   Diagnosis Date Noted  . GIB (gastrointestinal bleeding) 09/13/2017  . Apnea 09/13/2017  . Essential hypertension   . Cellulitis and abscess of neck 08/12/2017  . Cellulitis of submandibular region 08/10/2017  . Odontogenic infection of jaw 08/10/2017  . Cholecystitis 02/14/2017  . Opioid abuse with opioid-induced mood disorder ([place]) 01/25/2017  . Anxiety 02/11/2014  . Depression 07/12/2012    Past Surgical History:  Procedure Laterality Date  . ANKLE SURGERY    . BACK SURGERY    . CESAREAN SECTION    . CHOLECYSTECTOMY N/A 02/15/2017   Procedure: LAPAROSCOPIC CHOLECYSTECTOMY;  Surgeon: Clovis Riley, MD;  Location: [facility] OR[facility];  Service: General;  Laterality: N/A;  .  ESOPHAGOGASTRODUODENOSCOPY (EGD) WITH PROPOFOL N/A 09/15/2017   Procedure: ESOPHAGOGASTRODUODENOSCOPY (EGD) WITH PROPOFOL;  Surgeon: Clarene Essex, MD;  Location: [facility];  Service: Endoscopy;  Laterality: N/A;  . FRACTURE SURGERY    . GASTRIC BYPASS    . INCISION AND DRAINAGE OF PERITONSILLAR ABCESS Left 08/13/2017   Procedure: INCISION AND DRAINAGE OF LEFT NECK ABSCESS;  Surgeon: Melida Quitter, MD;  Location: [facility];  Service: ENT;  Laterality: Left;  . LAPAROSCOPIC LYSIS OF ADHESIONS  02/15/2017   Procedure: LAPAROSCOPIC LYSIS OF ADHESIONS;  Surgeon: Clovis Riley, MD;  Location: [facility];  Service: General;;  . LUMBAR [place]    . LUMBAR FUSION    . TUBAL LIGATION    . UPPER GI ENDOSCOPY N/A 02/15/2017   Procedure: UPPER GI ENDOSCOPY;  Surgeon: Clovis Riley, MD;  Location: [facility];  Service: General;  Laterality: N/A;     OB History   None      Home Medications    Prior to Admission medications   Medication Sig Start Date End Date Taking? Authorizing Provider  acetaminophen (TYLENOL) 500 MG tablet Take 1 tablet (500 mg total) by mouth every 6 (six) hours as needed for moderate pain. 09/15/17   Arrien, Jimmy Picket, MD  albuterol (PROVENTIL HFA;VENTOLIN HFA) 108 (90 BASE) MCG/ACT inhaler Inhale 1-2 puffs into the lungs every 4 (four) hours as needed for wheezing or shortness of breath. 03/07/15   Pisciotta,  Elmyra Ricks, PA-C  amitriptyline (ELAVIL) 50 MG tablet Take 50 mg by mouth at bedtime. 08/24/17   [provider]  amLODipine (NORVASC) 5 MG tablet Take 5 mg by mouth daily. 08/25/17   [provider]  clindamycin (CLEOCIN) 150 MG capsule Take 2 capsules (300 mg total) by mouth 4 (four) times daily for 7 days. 10/11/17 10/18/17  Dorie Rank, MD  DULoxetine (CYMBALTA) 60 MG capsule Take 60 mg by mouth daily. 12/23/14   [provider]  gabapentin (NEURONTIN) 300 MG capsule Take 300 mg by mouth 3 (three) times daily. 08/27/17   [provider]    lisinopril-hydrochlorothiazide (PRINZIDE,ZESTORETIC) 10-12.5 MG tablet Take 1 tablet by mouth daily. 01/22/17   [provider]  ondansetron (ZOFRAN-ODT) 8 MG disintegrating tablet Take 8 mg by mouth every 8 (eight) hours as needed for nausea or vomiting.  08/20/17   [provider]  oxyCODONE-acetaminophen (PERCOCET/ROXICET) 5-325 MG tablet Take 1 tablet by mouth 2 (two) times daily as needed for pain. 08/30/17   [provider]  pantoprazole (PROTONIX) 40 MG tablet Take 1 tablet (40 mg total) by mouth daily. 09/15/17 10/15/17  Arrien, Jimmy Picket, MD  traMADol (ULTRAM) 50 MG tablet Take 1 tablet (50 mg total) by mouth every 6 (six) hours as needed. 10/11/17   Dorie Rank, MD    Family History Family History  Problem Relation Age of Onset  . Diabetes Mother   . Hypertension Mother   . Diabetes Father   . Hypertension Father     Social History Social History   Tobacco Use  . Smoking status: Current Every Day Smoker    Packs/day: 1.00    Types: Cigarettes  . Smokeless tobacco: Never Used  Substance Use Topics  . Alcohol use: No  . Drug use: Yes    Types: IV    Comment: Heroin     Allergies   Ketorolac tromethamine and Methocarbamol   Review of Systems Review of Systems  All other systems reviewed and are negative.    Physical Exam Updated Vital Signs BP (!) 152/108 (BP Location: Right Arm)   Pulse (!) 102   Temp 99 F (37.2 C) (Oral)   Resp 20   SpO2 100%   Physical Exam  Constitutional: She appears well-developed and well-nourished. No distress.  HENT:  Head: Normocephalic and atraumatic.  Right Ear: External ear normal.  Left Ear: External ear normal.  Mouth/Throat: No oropharyngeal exudate.  Multiple absent teeth, no gingival swelling, no signs of lingual swelling, no peritonsillar abscess or edema; mild edema noted in the right soft tissue area mid body of the mandible, no fluctuance or induration, no erythema  Eyes: Conjunctivae  are normal. Right eye exhibits no discharge. Left eye exhibits no discharge. No scleral icterus.  Neck: Neck supple. No tracheal deviation present.  No cervical adenopathy, no neck swelling, no submandibular swelling  Cardiovascular: Normal rate, regular rhythm and intact distal pulses.  Pulmonary/Chest: Effort normal and breath sounds normal. No stridor. No respiratory distress. She has no wheezes. She has no rales.  Abdominal: Soft. Bowel sounds are normal. She exhibits no distension. There is no tenderness. There is no rebound and no guarding.  Musculoskeletal: She exhibits no edema or tenderness.  Neurological: She is alert. She has normal strength. No cranial nerve deficit (no facial droop, extraocular movements intact, no slurred speech) or sensory deficit. She exhibits normal muscle tone. She displays no seizure activity. Coordination normal.  Skin: Skin is warm and dry. No rash  noted.  Psychiatric: She has a normal mood and affect.  Nursing note and vitals reviewed.    ED Treatments / Results   Procedures Procedures (including critical care time)  Medications Ordered in ED Medications  traMADol (ULTRAM) tablet 50 mg (has no administration in time range)  clindamycin (CLEOCIN) capsule 300 mg (has no administration in time range)     Initial Impression / Assessment and Plan / ED Course  I have reviewed the triage vital signs and the nursing notes.  Pertinent labs & imaging results that were available during my care of the patient were reviewed by me and considered in my medical decision making (see chart for details).   Patient presented to the emergency room with facial pain.  Patient was seen earlier this month and had a CT scan that did not show any evidence of mass or abscess.  There was some inflammation on the left side of her face.  Today patient seems to have some swelling on the right side.  There is no significant erythema or induration.  I do not feel there is any  indication for repeat CT scan at this time.  I will prescribe the patient antibiotics and give her prescription for Ultram.  I am hesitant to prescribe any opiate pain medications considering her history of substance abuse and admissions for opiate overdose this year  Final Clinical Impressions(s) / ED Diagnoses   Final diagnoses:  Facial swelling    ED Discharge Orders        Ordered    clindamycin (CLEOCIN) 150 MG capsule  4 times daily     10/11/17 0831    traMADol (ULTRAM) 50 MG tablet  Every 6 hours PRN     10/11/17 0831       Dorie Rank, MD 10/11/17 [phone]

## 2017-10-24 IMAGING — CR DG ABDOMEN ACUTE W/ 1V CHEST
4 series · 4 of 4 positions shown · non-contrast
Comparison: None.

CLINICAL DATA: Abdominal pain for 1 week. Nausea and vomiting.
Cholecystectomy Wednesday February, 2017. Gastric bypass.

EXAM:
DG ABDOMEN ACUTE W/ 1V CHEST

[w chest pa]
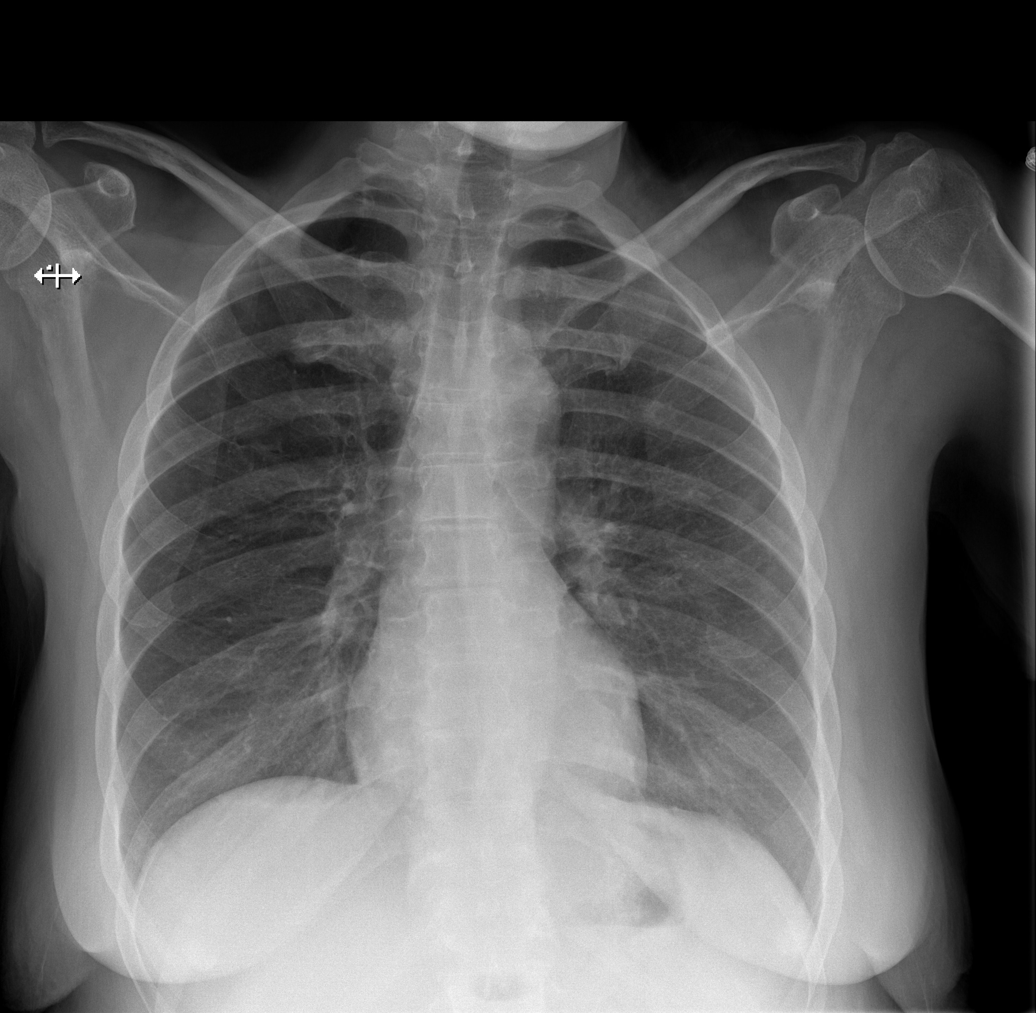

[w abdomen upright]
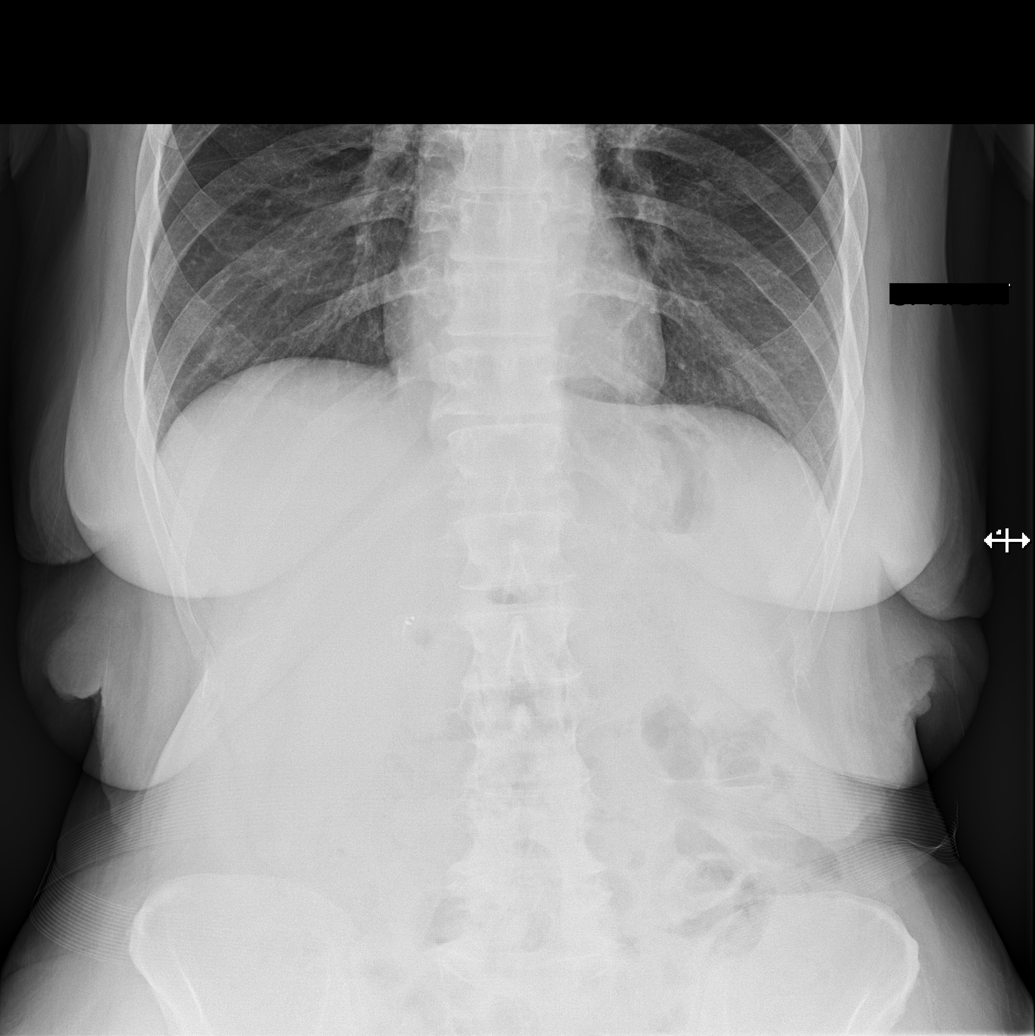

[t abdomen supine (1 of 2)]
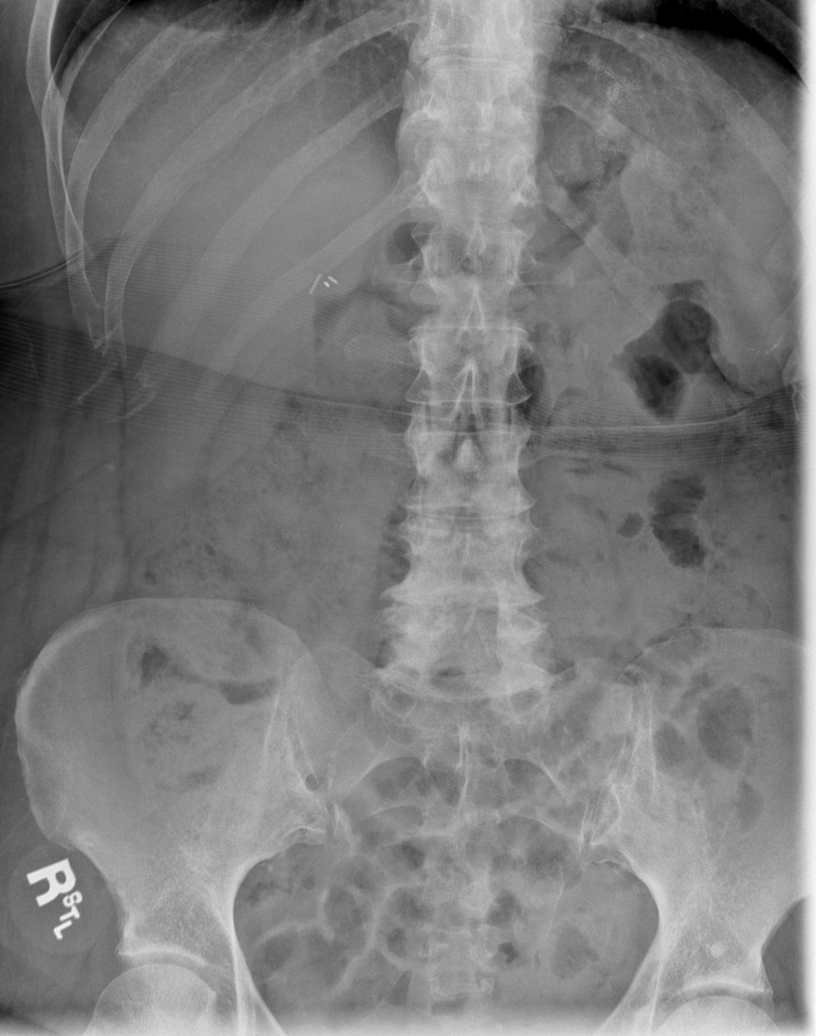

[t abdomen supine (2 of 2)]
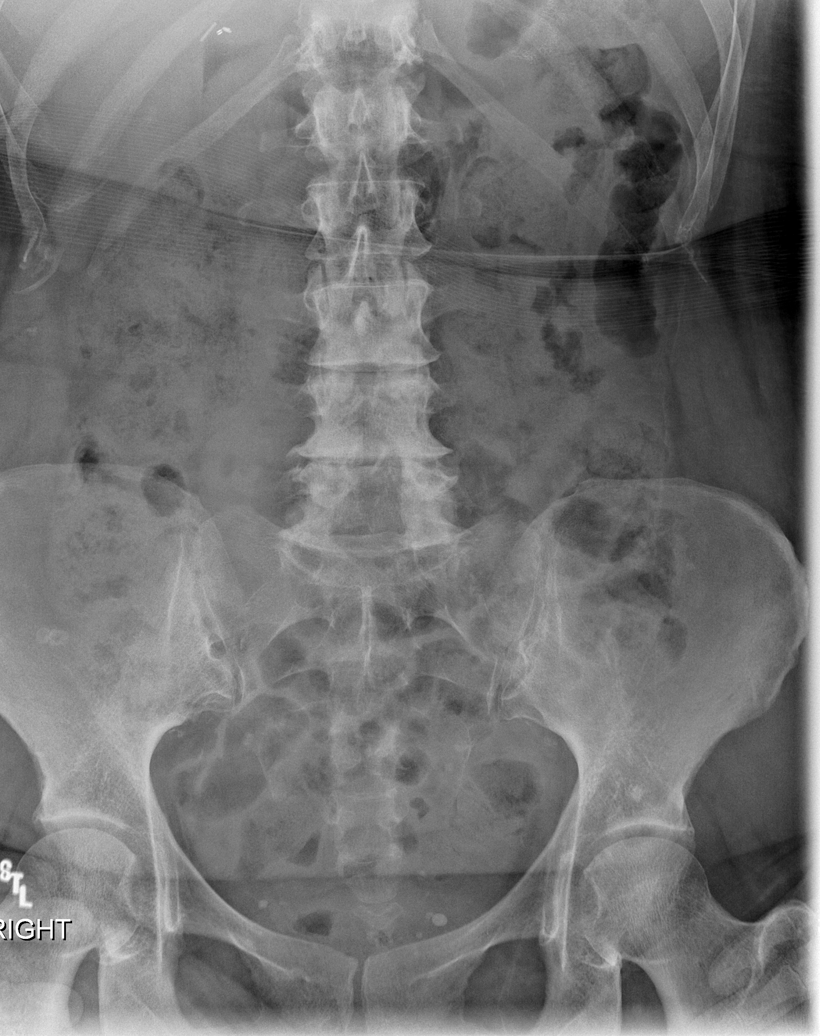

[4 of 4 positions shown; findings below may reference images not displayed]

FINDINGS: The heart, hila, and mediastinum are normal. No pneumothorax. No
pulmonary nodules, masses, or focal infiltrates.

No free air, portal venous gas, or pneumatosis. Cholecystectomy
clips in the right upper quadrant. No bowel obstruction. No cause
for the patient's symptoms identified on today's study.
IMPRESSION: Negative abdominal radiographs.  No acute cardiopulmonary disease.

## 2018-01-28 IMAGING — CR DG CHEST 2V
2 series · 2 of 2 positions shown · non-contrast
Comparison: 03/13/2017

CLINICAL DATA: Heroin overdose tonight. Fell in struck back of
head. Upper mid back pain.

EXAM:
CHEST  2 VIEW

[w chest lat]
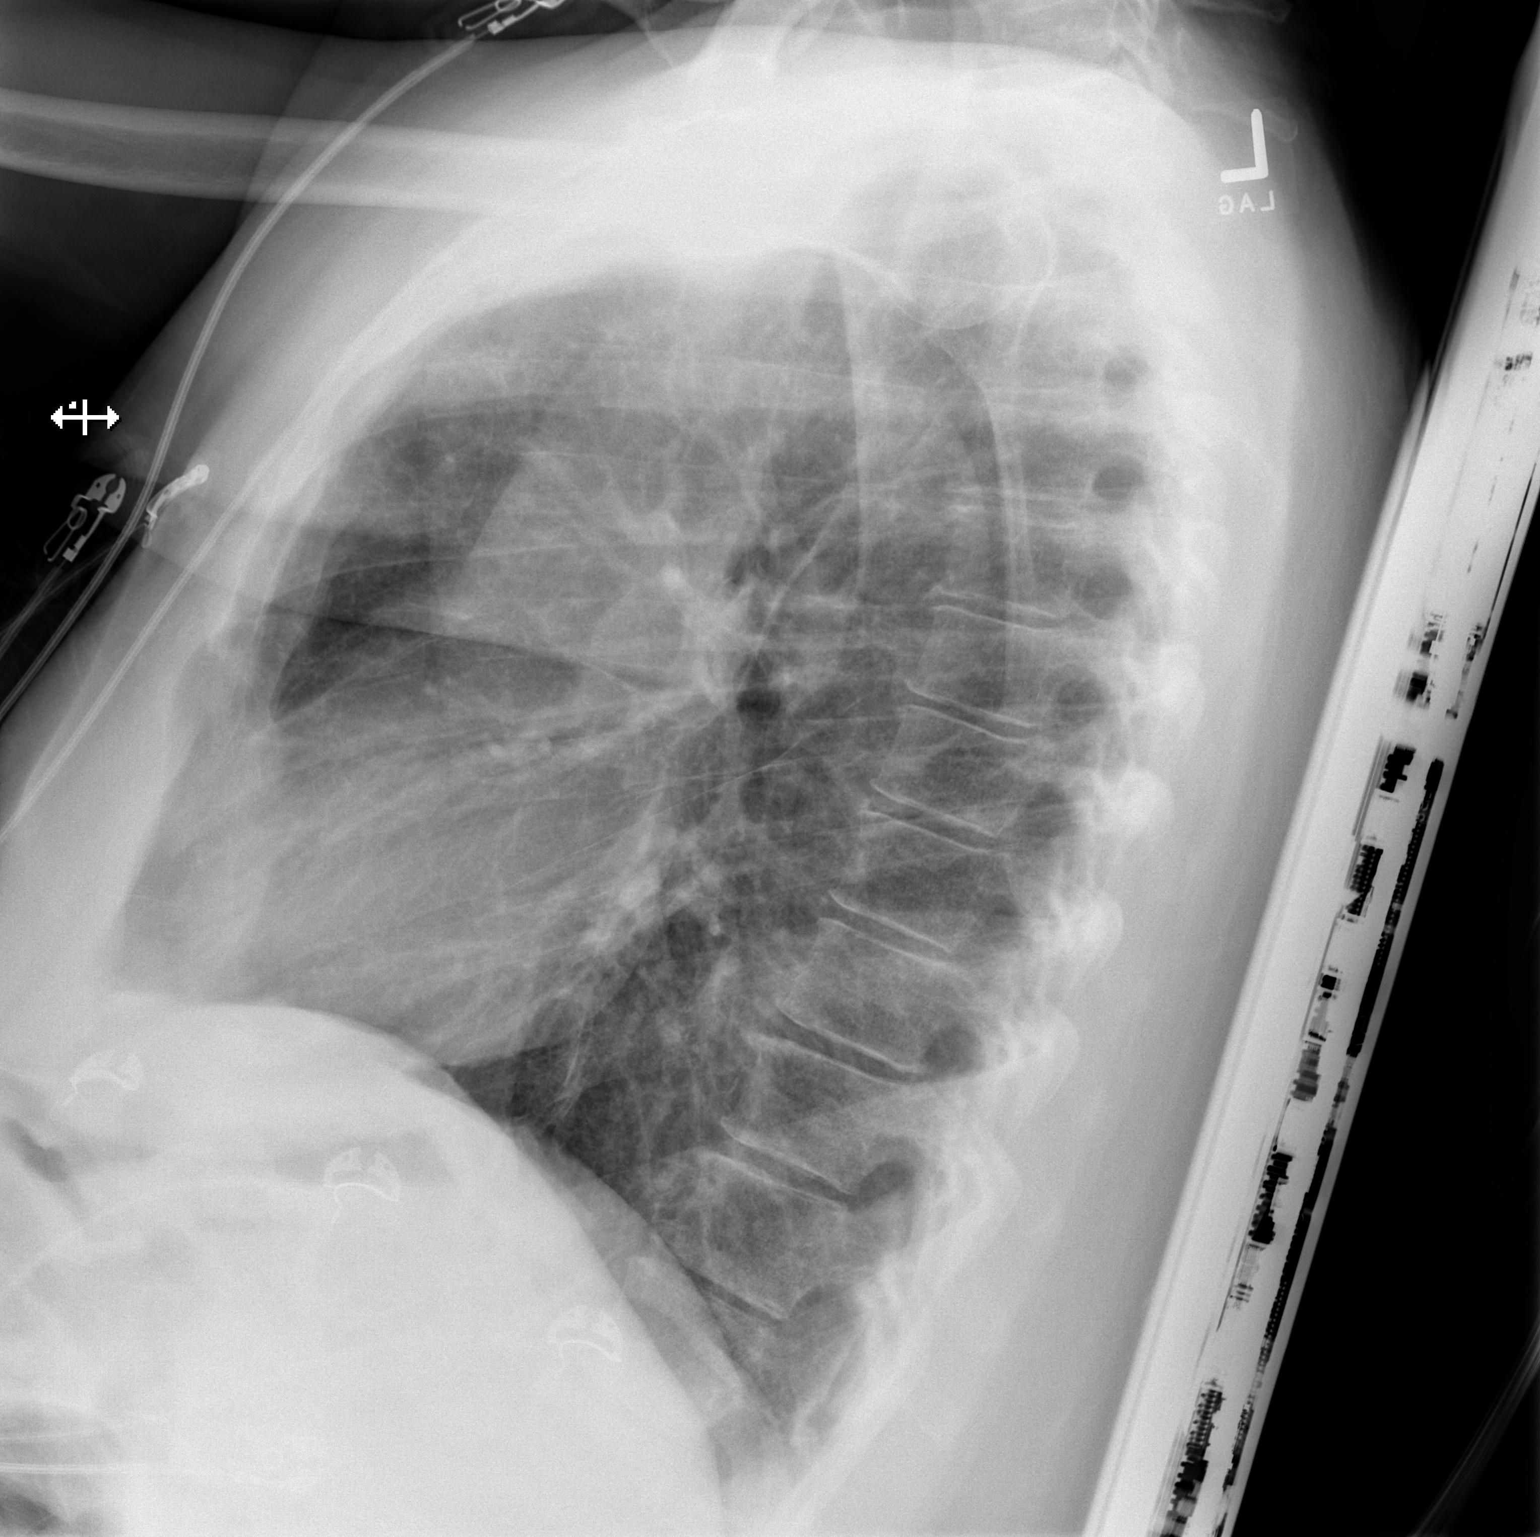

[x chest ap]
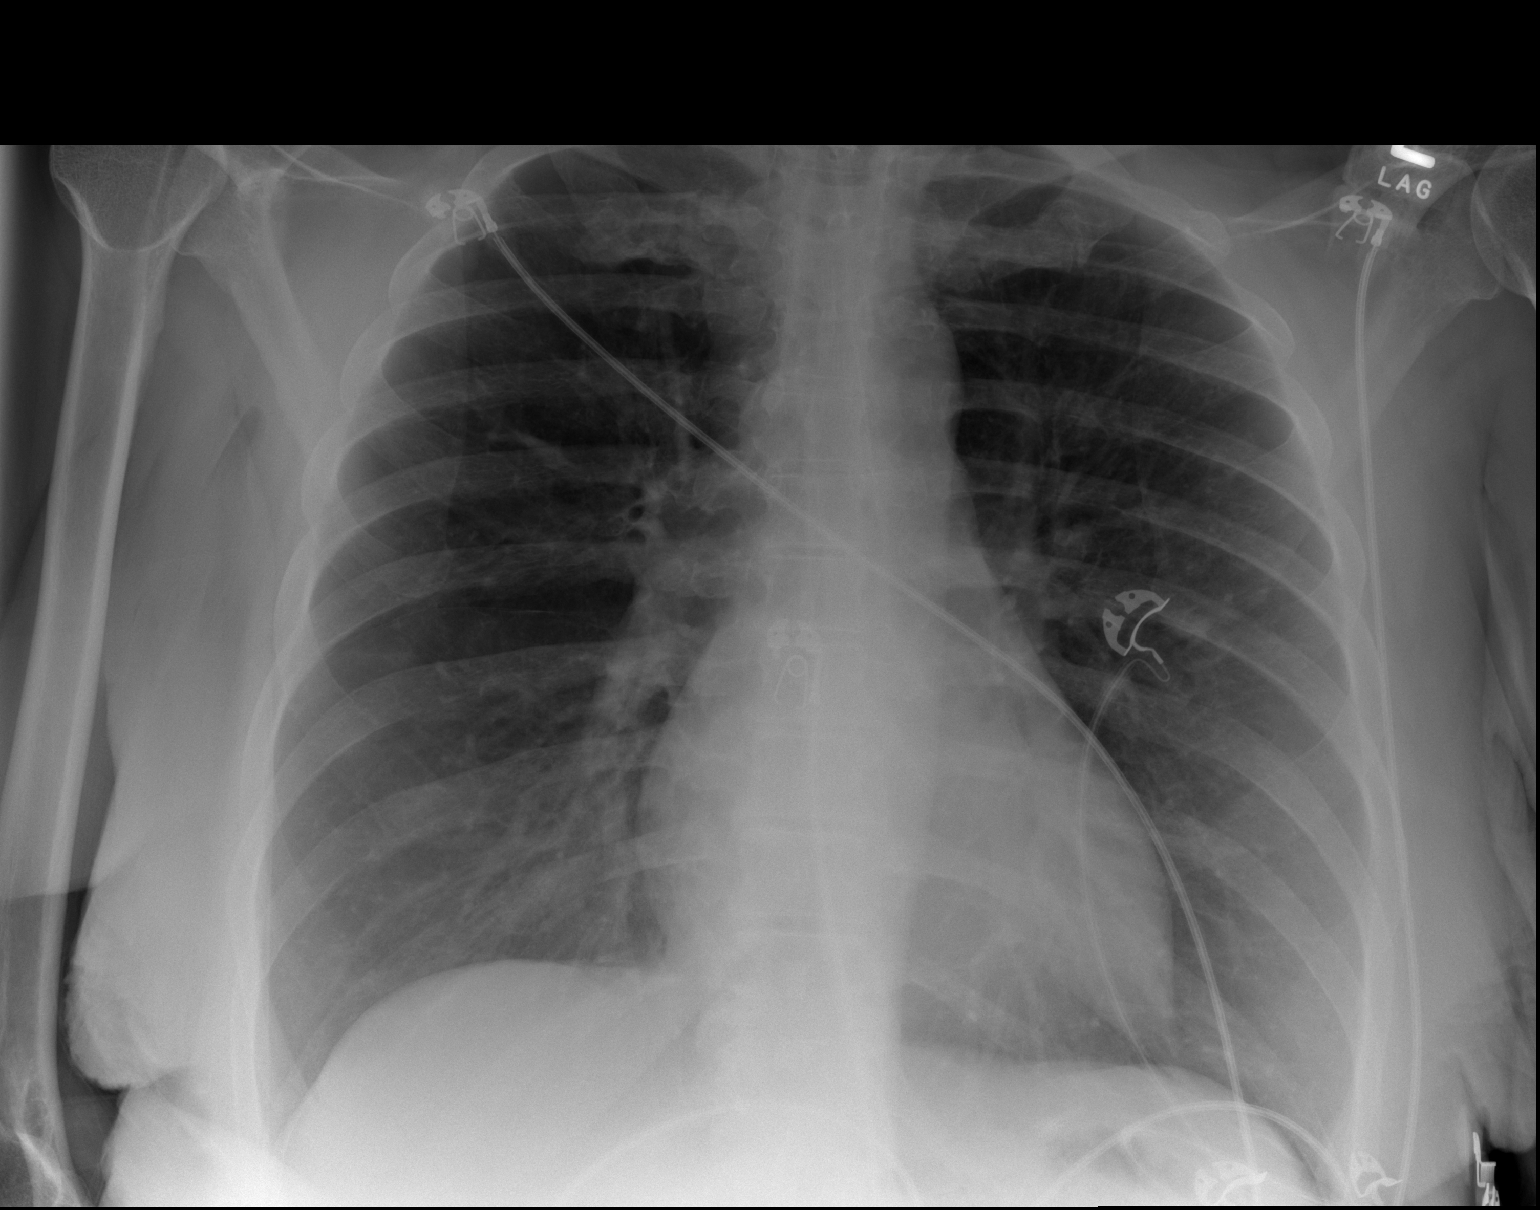

[2 of 2 positions shown; findings below may reference images not displayed]

FINDINGS: The heart size and mediastinal contours are within normal limits.
Both lungs are clear. The visualized skeletal structures are
unremarkable.
IMPRESSION: No active cardiopulmonary disease.

## 2018-01-30 ENCOUNTER — Other Ambulatory Visit: Payer: Self-pay

## 2018-01-30 ENCOUNTER — Encounter (HOSPITAL_COMMUNITY): Payer: Self-pay | Admitting: *Deleted

## 2018-01-30 NOTE — Anesthesia Preprocedure Evaluation (Addendum)
Anesthesia Evaluation  Patient identified by MRN, date of birth, ID band Patient awake    Reviewed: Allergy & Precautions, NPO status , Patient's Chart, lab work & pertinent test results  Airway Mallampati: II  TM Distance: >3 FB Neck ROM: Full    Dental no notable dental hx. (+) Poor Dentition, Dental Advisory Given   Pulmonary Current Smoker,    Pulmonary exam normal breath sounds clear to auscultation       Cardiovascular hypertension, Pt. on medications Normal cardiovascular exam Rhythm:Regular Rate:Normal     Neuro/Psych  Headaches, Bipolar Disorder  Neuromuscular disease    GI/Hepatic (+)     substance abuse  IV drug use,   Endo/Other    Renal/GU      Musculoskeletal  (+) Arthritis , Hx of Chronic pain   Abdominal   Peds  Hematology   Anesthesia Other Findings   Reproductive/Obstetrics                            Anesthesia Physical Anesthesia Plan  ASA: III  Anesthesia Plan: General   Post-op Pain Management:    Induction: Intravenous  PONV Risk Score and Plan: 2 and Treatment may vary due to age or medical condition, Ondansetron and Dexamethasone  Airway Management Planned: Nasal ETT and Oral ETT  Additional Equipment:   Intra-op Plan:   Post-operative Plan: Extubation in OR  Informed Consent: I have reviewed the patients History and Physical, chart, labs and discussed the procedure including the risks, benefits and alternatives for the proposed anesthesia with the patient or authorized representative who has indicated his/her understanding and acceptance.   Dental advisory given  Plan Discussed with: CRNA  Anesthesia Plan Comments: (U preg, Potential difficult IV Access,  Alpha 2 Blockade )       Anesthesia Quick Evaluation

## 2018-01-31 ENCOUNTER — Ambulatory Visit (HOSPITAL_COMMUNITY): Payer: Medicare Other | Admitting: Certified Registered"

## 2018-01-31 ENCOUNTER — Ambulatory Visit (HOSPITAL_COMMUNITY)
Admission: RE | Admit: 2018-01-31 | Discharge: 2018-01-31 | Disposition: A | Discharge status: Home / Self Care | Payer: Medicare Other | Source: Ambulatory Visit | Attending: Oral Surgery | Admitting: Oral Surgery

## 2018-01-31 ENCOUNTER — Encounter (HOSPITAL_COMMUNITY)
Admission: RE | Disposition: A | Discharge status: Home / Self Care | Payer: Self-pay | Source: Ambulatory Visit | Attending: Oral Surgery

## 2018-01-31 ENCOUNTER — Encounter (HOSPITAL_COMMUNITY): Payer: Self-pay

## 2018-01-31 DIAGNOSIS — F319 Bipolar disorder, unspecified: Secondary | ICD-10-CM | POA: Diagnosis not present

## 2018-01-31 DIAGNOSIS — I10 Essential (primary) hypertension: Secondary | ICD-10-CM | POA: Diagnosis not present

## 2018-01-31 DIAGNOSIS — G629 Polyneuropathy, unspecified: Secondary | ICD-10-CM | POA: Diagnosis not present

## 2018-01-31 DIAGNOSIS — Z79899 Other long term (current) drug therapy: Secondary | ICD-10-CM | POA: Insufficient documentation

## 2018-01-31 DIAGNOSIS — K029 Dental caries, unspecified: Secondary | ICD-10-CM | POA: Insufficient documentation

## 2018-01-31 DIAGNOSIS — F17200 Nicotine dependence, unspecified, uncomplicated: Secondary | ICD-10-CM | POA: Diagnosis not present

## 2018-01-31 DIAGNOSIS — Z86718 Personal history of other venous thrombosis and embolism: Secondary | ICD-10-CM | POA: Diagnosis not present

## 2018-01-31 HISTORY — DX: Personal history of other medical treatment: Z92.89

## 2018-01-31 HISTORY — PX: ALVEOLOPLASTY: SHX5710

## 2018-01-31 HISTORY — DX: Major depressive disorder, single episode, unspecified: F32.9

## 2018-01-31 HISTORY — DX: Nonspecific reaction to tuberculin skin test without active tuberculosis: R76.11

## 2018-01-31 HISTORY — DX: Unspecified osteoarthritis, unspecified site: M19.90

## 2018-01-31 HISTORY — DX: Acute embolism and thrombosis of unspecified deep veins of unspecified lower extremity: I82.409

## 2018-01-31 HISTORY — DX: Pneumonia, unspecified organism: J18.9

## 2018-01-31 HISTORY — DX: Depression, unspecified: F32.A

## 2018-01-31 HISTORY — PX: TOOTH EXTRACTION: SHX859

## 2018-01-31 HISTORY — DX: Polyneuropathy, unspecified: G62.9

## 2018-01-31 LAB — BASIC METABOLIC PANEL
Anion gap: 8 (ref 5–15)
BUN: 7 mg/dL (ref 6–20)
CHLORIDE: 101 mmol/L (ref 98–111)
CO2: 28 mmol/L (ref 22–32)
Calcium: 8.7 mg/dL — ABNORMAL LOW (ref 8.9–10.3)
Creatinine, Ser: 0.52 mg/dL (ref 0.44–1.00)
GFR calc Af Amer: >60 mL/min (ref >60)
GLUCOSE: 74 mg/dL (ref 70–99)
POTASSIUM: 4.5 mmol/L (ref 3.5–5.1)
Sodium: 137 mmol/L (ref 135–145)

## 2018-01-31 LAB — CBC
HCT: 29.7 % — ABNORMAL LOW (ref 36.0–46.0)
HEMOGLOBIN: 9.9 g/dL — AB (ref 12.0–15.0)
MCH: 25.9 pg — ABNORMAL LOW (ref 26.0–34.0)
MCHC: 33.3 g/dL (ref 30.0–36.0)
MCV: 77.7 fL — AB (ref 78.0–100.0)
PLATELETS: 278 10*3/uL (ref 150–400)
RBC: 3.82 MIL/uL — AB (ref 3.87–5.11)
RDW: 19.1 % — ABNORMAL HIGH (ref 11.5–15.5)
WBC: 7.4 10*3/uL (ref 4.0–10.5)

## 2018-01-31 LAB — POCT PREGNANCY, URINE: PREG TEST UR: NEGATIVE

## 2018-01-31 SURGERY — DENTAL RESTORATION/EXTRACTIONS
Anesthesia: General | Laterality: Bilateral

## 2018-01-31 MED ORDER — DEXMEDETOMIDINE HCL IN NACL 200 MCG/50ML IV SOLN
INTRAVENOUS | Status: DC | PRN
  Administered 2018-01-31: 12 ug via INTRAVENOUS

## 2018-01-31 MED ORDER — FENTANYL CITRATE (PF) 250 MCG/5ML IJ SOLN
INTRAMUSCULAR | Status: AC
  Filled 2018-01-31: qty 5

## 2018-01-31 MED ORDER — SODIUM CHLORIDE 0.9 % IV SOLN
INTRAVENOUS | Status: AC | PRN
  Administered 2018-01-31: 1000 mL

## 2018-01-31 MED ORDER — OXYMETAZOLINE HCL 0.05 % NA SOLN
NASAL | Status: AC
  Filled 2018-01-31: qty 15

## 2018-01-31 MED ORDER — MIDAZOLAM HCL 2 MG/2ML IJ SOLN
INTRAMUSCULAR | Status: AC
  Filled 2018-01-31: qty 2

## 2018-01-31 MED ORDER — PROMETHAZINE HCL 25 MG/ML IJ SOLN: 6.2500 mg | INTRAMUSCULAR | Status: DC | PRN

## 2018-01-31 MED ORDER — HYDROMORPHONE HCL 1 MG/ML IJ SOLN
0.2500 mg | INTRAMUSCULAR | Status: DC | PRN
  Administered 2018-01-31: 0.5 mg via INTRAVENOUS

## 2018-01-31 MED ORDER — CEFAZOLIN SODIUM-DEXTROSE 2-4 GM/100ML-% IV SOLN
2.0000 g | INTRAVENOUS | Status: AC
  Administered 2018-01-31: 2 g via INTRAVENOUS
  Filled 2018-01-31: qty 100

## 2018-01-31 MED ORDER — FENTANYL CITRATE (PF) 100 MCG/2ML IJ SOLN
INTRAMUSCULAR | Status: DC | PRN
  Administered 2018-01-31 (×5): 50 ug via INTRAVENOUS

## 2018-01-31 MED ORDER — MEPERIDINE HCL 50 MG/ML IJ SOLN: 6.2500 mg | INTRAMUSCULAR | Status: DC | PRN

## 2018-01-31 MED ORDER — LIDOCAINE 2% (20 MG/ML) 5 ML SYRINGE
INTRAMUSCULAR | Status: AC
  Filled 2018-01-31: qty 5

## 2018-01-31 MED ORDER — ACETAMINOPHEN 10 MG/ML IV SOLN: 1000.0000 mg | Freq: Once | INTRAVENOUS | Status: DC | PRN

## 2018-01-31 MED ORDER — ONDANSETRON HCL 4 MG/2ML IJ SOLN
INTRAMUSCULAR | Status: DC | PRN
  Administered 2018-01-31: 4 mg via INTRAVENOUS

## 2018-01-31 MED ORDER — OXYMETAZOLINE HCL 0.05 % NA SOLN
NASAL | Status: DC | PRN
  Administered 2018-01-31 (×2): 2 via NASAL

## 2018-01-31 MED ORDER — LACTATED RINGERS IV SOLN
INTRAVENOUS | Status: DC | PRN
  Administered 2018-01-31: 07:00:00 via INTRAVENOUS

## 2018-01-31 MED ORDER — LIDOCAINE-EPINEPHRINE 2 %-1:100000 IJ SOLN
INTRAMUSCULAR | Status: DC | PRN
  Administered 2018-01-31: 17 mL via INTRADERMAL

## 2018-01-31 MED ORDER — LIDOCAINE 2% (20 MG/ML) 5 ML SYRINGE
INTRAMUSCULAR | Status: DC | PRN
  Administered 2018-01-31: 100 mg via INTRAVENOUS

## 2018-01-31 MED ORDER — HYDRALAZINE HCL 20 MG/ML IJ SOLN
10.0000 mg | Freq: Once | INTRAMUSCULAR | Status: AC
  Administered 2018-01-31: 10 mg via INTRAVENOUS

## 2018-01-31 MED ORDER — HYDROCODONE-ACETAMINOPHEN 7.5-325 MG PO TABS
1.0000 | ORAL_TABLET | Freq: Once | ORAL | Status: AC | PRN
  Administered 2018-01-31: 1 via ORAL

## 2018-01-31 MED ORDER — HYDROMORPHONE HCL 1 MG/ML IJ SOLN
INTRAMUSCULAR | Status: AC
  Filled 2018-01-31: qty 1

## 2018-01-31 MED ORDER — PROPOFOL 10 MG/ML IV BOLUS
INTRAVENOUS | Status: AC
  Filled 2018-01-31: qty 20

## 2018-01-31 MED ORDER — ROCURONIUM BROMIDE 50 MG/5ML IV SOSY
PREFILLED_SYRINGE | INTRAVENOUS | Status: DC | PRN
  Administered 2018-01-31: 40 mg via INTRAVENOUS

## 2018-01-31 MED ORDER — HYDRALAZINE HCL 20 MG/ML IJ SOLN
INTRAMUSCULAR | Status: AC
  Filled 2018-01-31: qty 1

## 2018-01-31 MED ORDER — PROPOFOL 10 MG/ML IV BOLUS
INTRAVENOUS | Status: DC | PRN
  Administered 2018-01-31: 200 mg via INTRAVENOUS

## 2018-01-31 MED ORDER — SUCCINYLCHOLINE CHLORIDE 200 MG/10ML IV SOSY
PREFILLED_SYRINGE | INTRAVENOUS | Status: AC
  Filled 2018-01-31: qty 10

## 2018-01-31 MED ORDER — LIDOCAINE-EPINEPHRINE 2 %-1:100000 IJ SOLN
INTRAMUSCULAR | Status: AC
  Filled 2018-01-31: qty 3.4

## 2018-01-31 MED ORDER — SUGAMMADEX SODIUM 200 MG/2ML IV SOLN
INTRAVENOUS | Status: DC | PRN
  Administered 2018-01-31: 160 mg via INTRAVENOUS

## 2018-01-31 MED ORDER — OXYCODONE-ACETAMINOPHEN 5-325 MG PO TABS: 1.0000 | ORAL_TABLET | ORAL | 0 refills | Status: DC | PRN

## 2018-01-31 MED ORDER — DEXAMETHASONE SODIUM PHOSPHATE 10 MG/ML IJ SOLN
INTRAMUSCULAR | Status: DC | PRN
  Administered 2018-01-31: 10 mg via INTRAVENOUS

## 2018-01-31 MED ORDER — ROCURONIUM BROMIDE 50 MG/5ML IV SOSY
PREFILLED_SYRINGE | INTRAVENOUS | Status: AC
  Filled 2018-01-31: qty 5

## 2018-01-31 MED ORDER — HYDROCODONE-ACETAMINOPHEN 7.5-325 MG PO TABS
ORAL_TABLET | ORAL | Status: AC
  Filled 2018-01-31: qty 1

## 2018-01-31 MED ORDER — ACETAMINOPHEN 500 MG PO TABS: 500.0000 mg | ORAL_TABLET | Freq: Four times a day (QID) | ORAL | 0 refills | Status: DC | PRN

## 2018-01-31 MED ORDER — LIDOCAINE-EPINEPHRINE 2 %-1:100000 IJ SOLN
INTRAMUSCULAR | Status: AC
  Filled 2018-01-31: qty 1

## 2018-01-31 MED ORDER — 0.9 % SODIUM CHLORIDE (POUR BTL) OPTIME
TOPICAL | Status: DC | PRN
  Administered 2018-01-31: 1000 mL

## 2018-01-31 MED ORDER — AMOXICILLIN 500 MG PO CAPS: 500.0000 mg | ORAL_CAPSULE | Freq: Three times a day (TID) | ORAL | 0 refills | Status: DC

## 2018-01-31 MED ORDER — MIDAZOLAM HCL 5 MG/5ML IJ SOLN
INTRAMUSCULAR | Status: DC | PRN
  Administered 2018-01-31: 2 mg via INTRAVENOUS

## 2018-01-31 SURGICAL SUPPLY — 40 items
BLADE SURG 15 STRL LF DISP TIS (BLADE) ×1 IMPLANT
BLADE SURG 15 STRL SS (BLADE) ×3
BUR CROSS CUT FISSURE 1.6 (BURR) ×2 IMPLANT
BUR CROSS CUT FISSURE 1.6MM (BURR) ×1
BUR EGG ELITE 4.0 (BURR) ×2 IMPLANT
BUR EGG ELITE 4.0MM (BURR) ×1
CANISTER SUCT 3000ML PPV (MISCELLANEOUS) ×3 IMPLANT
COVER SURGICAL LIGHT HANDLE (MISCELLANEOUS) ×3 IMPLANT
DECANTER SPIKE VIAL GLASS SM (MISCELLANEOUS) ×3 IMPLANT
DRAPE U-SHAPE 76X120 STRL (DRAPES) ×3 IMPLANT
GAUZE PACKING FOLDED 2  STR (GAUZE/BANDAGES/DRESSINGS) ×2
GAUZE PACKING FOLDED 2 STR (GAUZE/BANDAGES/DRESSINGS) ×1 IMPLANT
GLOVE BIO SURGEON STRL SZ 6.5 (GLOVE) ×2 IMPLANT
GLOVE BIO SURGEON STRL SZ7 (GLOVE) IMPLANT
GLOVE BIO SURGEON STRL SZ7.5 (GLOVE) ×3 IMPLANT
GLOVE BIO SURGEONS STRL SZ 6.5 (GLOVE) ×1
GLOVE BIOGEL PI IND STRL 6.5 (GLOVE) IMPLANT
GLOVE BIOGEL PI IND STRL 7.0 (GLOVE) ×1 IMPLANT
GLOVE BIOGEL PI INDICATOR 6.5 (GLOVE)
GLOVE BIOGEL PI INDICATOR 7.0 (GLOVE) ×2
GOWN STRL REUS W/ TWL LRG LVL3 (GOWN DISPOSABLE) ×1 IMPLANT
GOWN STRL REUS W/ TWL XL LVL3 (GOWN DISPOSABLE) ×1 IMPLANT
GOWN STRL REUS W/TWL LRG LVL3 (GOWN DISPOSABLE) ×3
GOWN STRL REUS W/TWL XL LVL3 (GOWN DISPOSABLE) ×3
IV NS 1000ML (IV SOLUTION) ×3
IV NS 1000ML BAXH (IV SOLUTION) ×1 IMPLANT
KIT BASIN OR (CUSTOM PROCEDURE TRAY) ×3 IMPLANT
KIT TURNOVER KIT B (KITS) ×3 IMPLANT
NDL PRECISIONGLIDE 27X1.5 (NEEDLE) IMPLANT
NEEDLE 22X1 1/2 (OR ONLY) (NEEDLE) ×6 IMPLANT
NEEDLE PRECISIONGLIDE 27X1.5 (NEEDLE) IMPLANT
NS IRRIG 1000ML POUR BTL (IV SOLUTION) ×3 IMPLANT
PAD ARMBOARD 7.5X6 YLW CONV (MISCELLANEOUS) ×3 IMPLANT
SPONGE SURGIFOAM ABS GEL 12-7 (HEMOSTASIS) IMPLANT
SUT CHROMIC 3 0 PS 2 (SUTURE) ×3 IMPLANT
SYR CONTROL 10ML LL (SYRINGE) ×3 IMPLANT
TOWEL GREEN STERILE (TOWEL DISPOSABLE) IMPLANT
TRAY ENT MC OR (CUSTOM PROCEDURE TRAY) ×3 IMPLANT
TUBING IRRIGATION (MISCELLANEOUS) ×3 IMPLANT
YANKAUER SUCT BULB TIP NO VENT (SUCTIONS) ×3 IMPLANT

## 2018-01-31 NOTE — Op Note (Signed)
01/31/2018  8:39 AM  PATIENT:  Terri [name]  42 y.o. female  PRE-OPERATIVE DIAGNOSIS:  NONRESTORABLE TEETH # 2, 5, 6, 7, 11, 12, 13, 21, 22, 23, 25, 27, 28  POST-OPERATIVE DIAGNOSIS:  SAME  PROCEDURE:  Procedure(s): EXTRACTION TEETH # 2, 5, 6, 7, 11, 12, 13, 21, 22, 23, 25, 27, 28 ALVEOLOPLASTY RIGHT AND LEFT MANDIBLE, LEFT MAXILLA  SURGEON:  Surgeon(s): Diona Browner, DDS  ANESTHESIA:   local and general  EBL:  minimal  DRAINS: none   SPECIMEN:  No Specimen  COUNTS:  YES  PLAN OF CARE: Discharge to home after PACU  PATIENT DISPOSITION:  PACU - hemodynamically stable.   PROCEDURE DETAILS: Dictation # [id number]  Gae Bon, DMD 01/31/2018 8:39 AM

## 2018-01-31 NOTE — H&P (Signed)
HISTORY AND PHYSICAL  Terri [name] is a 42 y.o. female patient with CC: Painful teeth. Frequent infections.  No diagnosis found.  Past Medical History:  Diagnosis Date  . Arthritis    Back and legs  . Bipolar disorder ([place])   . Chronic back pain   . Depression   . DVT (deep venous thrombosis) (HCC)    early 20's leg  . History of blood transfusion   . Hypertension   . Migraine headache   . Neuropathy   . Pneumonia   . 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 Sciatica   . Stress incontinence     Current Facility-Administered Medications  Medication Dose Route Frequency Provider Last Rate Last Dose  . ceFAZolin (ANCEF) IVPB 2g/100 mL premix  2 g Intravenous On Call to [place], [place], DDS      . oxymetazoline (AFRIN) 0.05 % nasal spray            Allergies  Allergen Reactions  . Ketorolac Tromethamine Itching and Nausea And Vomiting  . Methocarbamol Diarrhea   Active Problems:   * No active hospital problems. *  Vitals: Blood pressure (!) 148/95, pulse 65, temperature 98.3 F (36.8 C), temperature source Oral, resp. rate 18, height 5\' 9"  (1.753 m), weight 75.8 kg, last menstrual period 01/16/2018, SpO2 98 %. Lab results: Results for orders placed or performed during the hospital encounter of 01/31/18 (from the past 24 hour(s))  CBC     Status: Abnormal   Collection Time: 01/31/18  6:24 AM  Result Value Ref Range   WBC 7.4 4.0 - 10.5 K/uL   RBC 3.82 (L) 3.87 - 5.11 MIL/uL   Hemoglobin 9.9 (L) 12.0 - 15.0 g/dL   HCT 29.7 (L) 36.0 - 46.0 %   MCV 77.7 (L) 78.0 - 100.0 fL   MCH 25.9 (L) 26.0 - 34.0 pg   MCHC 33.3 30.0 - 36.0 g/dL   RDW 19.1 (H) 11.5 - 15.5 %   Platelets 278 150 - 400 K/uL  Basic metabolic panel     Status: Abnormal   Collection Time: 01/31/18  6:24 AM  Result Value Ref Range   Sodium 137 135 - 145 mmol/L   Potassium 4.5 3.5 - 5.1 mmol/L   Chloride 101 98 - 111 mmol/L   CO2 28 22 - 32 mmol/L   Glucose, Bld 74 70 - 99 mg/dL   BUN 7 6 - 20 mg/dL    Creatinine, Ser 0.52 0.44 - 1.00 mg/dL   Calcium 8.7 (L) 8.9 - 10.3 mg/dL   GFR calc non Af Amer >60 >60 mL/min   GFR calc Af Amer >60 >60 mL/min   Anion gap 8 5 - 15  Pregnancy, urine POC     Status: None   Collection Time: 01/31/18  6:37 AM  Result Value Ref Range   Preg Test, Ur NEGATIVE NEGATIVE   Radiology Results: No results found. General appearance: alert, cooperative and no distress Head: Normocephalic, without obvious abnormality, atraumatic Eyes: negative Nose: Nares normal. Septum midline. Mucosa normal. No drainage or sinus tenderness. Throat: multiple carious teeth, no masses or lesions. pharynx clear. Neck: no adenopathy, supple, symmetrical, trachea midline and thyroid not enlarged, symmetric, no tenderness/mass/nodules Resp: clear to auscultation bilaterally Cardio: regular rate and rhythm, S1, S2 normal, no murmur, click, rub or gallop  Assessment: Multiple nonrestorable teeth secondary to dental caries   Plan: Full mouth extractions. Alveoloplasty. GA. Day surgery.   Diona Browner  01/31/2018  

## 2018-01-31 NOTE — Transfer of Care (Signed)
Immediate Anesthesia Transfer of Care Note  Patient: Terri [name]  Procedure(s) Performed: DENTAL RESTORATION/EXTRACTIONS (Bilateral ) ALVEOLOPLASTY (Bilateral )  Patient Location: PACU  Anesthesia Type:General  Level of Consciousness: awake and patient cooperative  Airway & Oxygen Therapy: Patient Spontanous Breathing  Post-op Assessment: Report given to RN and Post -op Vital signs reviewed and stable  Post vital signs: Reviewed and stable  Last Vitals:  Vitals Value Taken Time  BP 163/111 01/31/2018  8:45 AM  Temp    Pulse    Resp 15 01/31/2018  8:46 AM  SpO2    Vitals shown include unvalidated device data.  Last Pain:  Vitals:   01/31/18 0652  TempSrc:   PainSc: 7          Complications: No apparent anesthesia complications

## 2018-01-31 NOTE — Anesthesia Postprocedure Evaluation (Signed)
Anesthesia Post Note  Patient: Terri [name]  Procedure(s) Performed: DENTAL RESTORATION/EXTRACTIONS (Bilateral ) ALVEOLOPLASTY (Bilateral )     Patient location during evaluation: PACU Anesthesia Type: General Level of consciousness: awake and alert Pain management: pain level controlled Vital Signs Assessment: post-procedure vital signs reviewed and stable Respiratory status: spontaneous breathing, nonlabored ventilation, respiratory function stable and patient connected to nasal cannula oxygen Cardiovascular status: blood pressure returned to baseline and stable Postop Assessment: no apparent nausea or vomiting Anesthetic complications: no    Last Vitals:  Vitals:   01/31/18 1000 01/31/18 1011  BP: (!) 135/91 (!) 146/94  Pulse: 73 73  Resp: 13 14  Temp: 36.6 C   SpO2: 94% 95%    Last Pain:  Vitals:   01/31/18 1000  TempSrc:   PainSc: [place]

## 2018-01-31 NOTE — Anesthesia Procedure Notes (Signed)
Procedure Name: Intubation Date/Time: 01/31/2018 7:51 AM Performed by: Lance Coon, CRNA Pre-anesthesia Checklist: Patient identified, Emergency Drugs available, Suction available, Patient being monitored and Timeout performed Patient Re-evaluated:Patient Re-evaluated prior to induction Oxygen Delivery Method: Circle system utilized Preoxygenation: Pre-oxygenation with 100% oxygen Induction Type: IV induction Ventilation: Mask ventilation without difficulty Laryngoscope Size: Miller and 3 Grade View: Grade I Nasal Tubes: Right and Nasal Rae Tube size: 7.0 mm Number of attempts: 1 Placement Confirmation: ETT inserted through vocal cords under direct vision,  positive ETCO2 and breath sounds checked- equal and bilateral Tube secured with: Tape Dental Injury: Teeth and Oropharynx as per pre-operative assessment

## 2018-01-31 NOTE — H&P (Signed)
Anesthesia H&P Update: History and Physical Exam reviewed; patient is OK for planned anesthetic and procedure. ? ?

## 2018-01-31 NOTE — Op Note (Signed)
Terri [name], [name] MEDICAL RECORD [id number] ACCOUNT [id number] DATE OF BIRTH:Aug 10, 1975 FACILITY: [facility] LOCATION: [facility] PHYSICIAN:[place] Monds M. Emalia Witkop, DDS  OPERATIVE REPORT  DATE OF PROCEDURE:  01/31/2018  POSTOPERATIVE DIAGNOSIS:  Nonrestorable teeth secondary to dental caries numbers 2, 5, 6 7, 11, 12, 13, 21, 22, 23, 25, 27, 28.  PROCEDURE:  Extraction of teeth numbers 2, 5, 6, 7, 11, 12, 13, 21, 22, 23, 25, 27, 28, alveoplasty right and left mandible, alveoplasty left maxilla.  SURGEON:  Diona Browner, DDS  ANESTHESIA:  General, nasal intubation.  DESCRIPTION OF PROCEDURE:  The patient was brought to the operating room and placed on the table in supine position.  General anesthesia was administered and a nasal endotracheal tube was placed and secured.  The eyes were protected and the patient was  draped for surgery.  A timeout was performed.  The posterior pharynx was suctioned and a throat pack was placed, 2% lidocaine 1:100,000 epinephrine was infiltrated and an inferior alveolar block on the right and left sides and in buccal and palatal  infiltration in the maxilla.  A bite block was placed on the right side of the mouth and a sweetheart retractor was used to retract the tongue.  A #15 blade used to make an incision approximately 1 cm proximal to tooth 21 along the alveolar crest.  The  incision was carried forward and split into the gingival sulcus both buccally and lingually around teeth numbers 21, 22 and 23 and then continued along the alveolar crest until tooth 25 was encircled and then carried down to 27 along the alveolar crest.   The periosteum was reflected.  The teeth were elevated.  Bone was removed from around teeth numbers 21 and 22 as the crowns fractured off when attempting to remove them with the Ash forceps.  Teeth 23 and 25 were removed uneventfully.  Bone was removed  around 21 and 22 and then the teeth were elevated and removed with the forceps.  Then, the  sockets were curetted.  The periosteum was reflected and the gingiva was trimmed and alveoplasty was performed using an egg-shaped bur and bone file.  The area was  irrigated and closed with 3-0 chromic.  In the left maxilla,  a #15 blade was used to make an incision beginning at tooth 13 approximately 1 cm proximal along the alveolar crest and then carried forward around teeth numbers 13, 12 and 11 and then  continued about a centimeter towards the midline along the alveolar crest from there.  The periosteum was reflected.  The teeth were elevated with a 301 elevator and removed from the mouth with the upper Universal forceps.  Sockets were curetted.  The  gingiva was trimmed and the periosteum was reflected to expose the alveolar crest which had a large buccal overhang.  This was smoothed using the egg-shaped bur and then the bone file.  Then, the area was irrigated and closed with 3-0 chromic and the  oral bite block and sweetheart retractor were repositioned to the other side of the mouth.  An incision was made around teeth numbers 27 and 28 and around teeth numbers 2, 5, 6 and 7.  The periosteum was reflected.  The teeth were elevated and removed  with the dental forceps.  In the mandible, there was a buccal overhang of tissue and bone and this was addressed by reflecting the tissue with a periosteal elevator, and performing alveoplasty with an egg-shaped bur and bone file.  The tissue in the  area  of the right maxilla did not require alveoplasty.  Then, the oral cavity was irrigated and suctioned and throat pack was removed.  The patient was left in the care of Anesthesia for transport to recovery room and plans for discharge through day surgery.  ESTIMATED BLOOD LOSS:  Minimal.  COMPLICATIONS:  None.  SPECIMENS:  None.  TN/NUANCE  D:01/31/2018 T:01/31/2018 JOB:[id number]

## 2018-02-01 ENCOUNTER — Encounter (HOSPITAL_COMMUNITY): Payer: Self-pay | Admitting: Oral Surgery

## 2018-03-01 ENCOUNTER — Emergency Department (HOSPITAL_COMMUNITY)
Admission: EM | Admit: 2018-03-01 | Discharge: 2018-03-01 | Discharge status: Against Medical Advice | Payer: Medicare Other | Attending: Emergency Medicine | Admitting: Emergency Medicine

## 2018-03-01 ENCOUNTER — Encounter (HOSPITAL_COMMUNITY): Payer: Self-pay | Admitting: Emergency Medicine

## 2018-03-01 DIAGNOSIS — F17210 Nicotine dependence, cigarettes, uncomplicated: Secondary | ICD-10-CM | POA: Insufficient documentation

## 2018-03-01 DIAGNOSIS — Z79899 Other long term (current) drug therapy: Secondary | ICD-10-CM | POA: Insufficient documentation

## 2018-03-01 DIAGNOSIS — R569 Unspecified convulsions: Secondary | ICD-10-CM | POA: Diagnosis not present

## 2018-03-01 DIAGNOSIS — F1410 Cocaine abuse, uncomplicated: Secondary | ICD-10-CM | POA: Diagnosis not present

## 2018-03-01 DIAGNOSIS — I10 Essential (primary) hypertension: Secondary | ICD-10-CM | POA: Diagnosis not present

## 2018-03-01 DIAGNOSIS — R51 Headache: Secondary | ICD-10-CM | POA: Diagnosis not present

## 2018-03-01 LAB — CBG MONITORING, ED: GLUCOSE-CAPILLARY: 78 mg/dL (ref 70–99)

## 2018-03-01 NOTE — ED Notes (Addendum)
Pt reports "snorting a line of cocaine" yesterday but denies any drug use today.

## 2018-03-01 NOTE — ED Notes (Signed)
While getting vitals for pt she says "why are we doing this, there is no need for this", notified pt that we get vitals on every pt especially possible seizure pt.

## 2018-03-01 NOTE — ED Notes (Signed)
Pt stating she needs to call her ride and wants to leave. Pt notified she is not ready for discharge yet. Pt found getting out of bed. Pt requesting her IV be taken out. [place], [place] notified. Security at bedside.

## 2018-03-01 NOTE — ED Triage Notes (Signed)
Pt here via [facility] from home, family reports a seizure. Pt reports pt fell out of bed during seizure.  Pt had a "seizure" in front of EMS and the charge nurse that did not appear to be an actual seizure. C/o h/a and back pain.  Pt reports using drugs yesterday. CBG 59 but pt refused oral glucose with EMS.  BP 133/80, 120 P, 98% RA. Pt confused about time.

## 2018-03-01 NOTE — ED Notes (Addendum)
[place], [place] at bedside. Pt explained the risks of leaving AMA. Pt notified to return if she would like to continue her evaluation. Pt given paper scrub pants. Pt given opportunity to call someone for ride. Pt escorted to lobby with security. Pt refused d/c vital signs. Pt signed AMA form with this RN as witness.

## 2018-03-01 NOTE — ED Provider Notes (Signed)
[facility] EMERGENCY DEPARTMENT Provider Note   CSN: [id number] Arrival date & time: 03/01/18  1729     History   Chief Complaint No chief complaint on file.   HPI Terri [name] is a 42 y.o. female with past medical history of polysubstance abuse, bipolar disorder, hypertension, anxiety, presenting to the emergency department via EMS to family concern of seizure-like activity.  Patient reportedly fell out of bed during a seizure.  Patient complaining of headache and back pain and endorses cocaine use yesterday.   The history is provided by the EMS personnel.    Past Medical History:  Diagnosis Date  . Arthritis    Back and legs  . Bipolar disorder ([place])   . Chronic back pain   . Depression   . DVT (deep venous thrombosis) (HCC)    early 20's leg  . History of blood transfusion   . Hypertension   . Migraine headache   . Neuropathy   . Pneumonia   . 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 Sciatica   . Stress incontinence     Patient Active Problem List   Diagnosis Date Noted  . GIB (gastrointestinal bleeding) 09/13/2017  . Apnea 09/13/2017  . Essential hypertension   . Cellulitis and abscess of neck 08/12/2017  . Cellulitis of submandibular region 08/10/2017  . Odontogenic infection of jaw 08/10/2017  . Cholecystitis 02/14/2017  . Opioid abuse with opioid-induced mood disorder ([place]) 01/25/2017  . Anxiety 02/11/2014  . Depression 07/12/2012    Past Surgical History:  Procedure Laterality Date  . ALVEOLOPLASTY Bilateral 01/31/2018   Procedure: ALVEOLOPLASTY;  Surgeon: Diona Browner, DDS;  Location: [facility];  Service: Oral Surgery;  Laterality: Bilateral;  . ANKLE SURGERY Left 1992   with hardware  . BACK SURGERY    . CESAREAN SECTION    . CHOLECYSTECTOMY N/A 02/15/2017   Procedure: LAPAROSCOPIC CHOLECYSTECTOMY;  Surgeon: Clovis Riley, MD;  Location: [facility];  Service: General;  Laterality: N/A;  . ESOPHAGOGASTRODUODENOSCOPY (EGD) WITH  PROPOFOL N/A 09/15/2017   Procedure: ESOPHAGOGASTRODUODENOSCOPY (EGD) WITH PROPOFOL;  Surgeon: Clarene Essex, MD;  Location: [facility];  Service: Endoscopy;  Laterality: N/A;  . GASTRIC BYPASS    . INCISION AND DRAINAGE OF PERITONSILLAR ABCESS Left 08/13/2017   Procedure: INCISION AND DRAINAGE OF LEFT NECK ABSCESS;  Surgeon: Melida Quitter, MD;  Location: [facility];  Service: ENT;  Laterality: Left;  . LAPAROSCOPIC LYSIS OF ADHESIONS  02/15/2017   Procedure: LAPAROSCOPIC LYSIS OF ADHESIONS;  Surgeon: Clovis Riley, MD;  Location: [facility];  Service: General;;  . LUMBAR [place]    . LUMBAR FUSION    . TOOTH EXTRACTION Bilateral 01/31/2018   Procedure: DENTAL RESTORATION/EXTRACTIONS;  Surgeon: Diona Browner, DDS;  Location: [facility];  Service: Oral Surgery;  Laterality: Bilateral;  . TUBAL LIGATION    . UPPER GI ENDOSCOPY N/A 02/15/2017   Procedure: UPPER GI ENDOSCOPY;  Surgeon: Clovis Riley, MD;  Location: [facility];  Service: General;  Laterality: N/A;     OB History   None      Home Medications    Prior to Admission medications   Medication Sig Start Date End Date Taking? Authorizing Provider  acetaminophen (TYLENOL) 500 MG tablet Take 1 tablet (500 mg total) by mouth every 6 (six) hours as needed for moderate pain. 01/31/18   Diona Browner, DDS  albuterol (PROVENTIL HFA;VENTOLIN HFA) 108 (90 BASE) MCG/ACT inhaler Inhale 1-2 puffs into the  lungs every 4 (four) hours as needed for wheezing or shortness of breath. 03/07/15   Pisciotta, Elmyra Ricks, PA-C  amitriptyline (ELAVIL) 50 MG tablet Take 50 mg by mouth at bedtime. 08/24/17   [provider]  amLODipine (NORVASC) 5 MG tablet Take 5 mg by mouth daily. 08/25/17   [provider]  amoxicillin (AMOXIL) 500 MG capsule Take 1 capsule (500 mg total) by mouth 3 (three) times daily. 01/31/18   Diona Browner, DDS  DULoxetine (CYMBALTA) 60 MG capsule Take 60 mg by mouth daily. 12/23/14   [provider]  gabapentin (NEURONTIN)  300 MG capsule Take 300 mg by mouth 3 (three) times daily. 08/27/17   [provider]  lisinopril-hydrochlorothiazide (PRINZIDE,ZESTORETIC) 10-12.5 MG tablet Take 1 tablet by mouth daily. 01/22/17   [provider]  ondansetron (ZOFRAN-ODT) 8 MG disintegrating tablet Take 8 mg by mouth every 8 (eight) hours as needed for nausea or vomiting.  08/20/17   [provider]  oxyCODONE-acetaminophen (PERCOCET) 5-325 MG tablet Take 1 tablet by mouth every 4 (four) hours as needed. 01/31/18   Diona Browner, DDS  pantoprazole (PROTONIX) 40 MG tablet Take 1 tablet (40 mg total) by mouth daily. 09/15/17 10/15/17  Arrien, Jimmy Picket, MD  traMADol (ULTRAM) 50 MG tablet Take 1 tablet (50 mg total) by mouth every 6 (six) hours as needed. 10/11/17   Dorie Rank, MD    Family History Family History  Problem Relation Age of Onset  . Diabetes Mother   . Hypertension Mother   . Diabetes Father   . Hypertension Father     Social History Social History   Tobacco Use  . Smoking status: Current Every Day Smoker    Packs/day: 1.00    Years: 29.00    Pack years: 29.00    Types: Cigarettes  . Smokeless tobacco: Never Used  Substance Use Topics  . Alcohol use: No  . Drug use: Yes    Types: IV    Comment: Heroin- last time 05/2017     Allergies   Ketorolac tromethamine and Methocarbamol   Review of Systems Review of Systems  Musculoskeletal: Positive for back pain.  Neurological: Positive for seizures and headaches.  All other systems reviewed and are negative.    Physical Exam Updated Vital Signs BP (!) 129/93 (BP Location: Left Arm)   Pulse (!) 102   Temp 99.2 F (37.3 C) (Oral)   Resp (!) 22   Ht 5\' 9"  (1.753 m)   Wt 72.6 kg   SpO2 98%   BMI 23.63 kg/m   Physical Exam  Constitutional: She appears well-developed and well-nourished.  HENT:  Head: Normocephalic and atraumatic.  Eyes: Conjunctivae are normal.  Cardiovascular:  slightly tachycardic    Pulmonary/Chest: Effort normal.  Abdominal: Soft.  Neurological: She is alert.  Patient is slightly agitated though answering questions appropriately.  She is alert.  No obvious abnormal coordination.  Cranial nerves grossly normal.  Pupils are slightly dilated.  Skin: Skin is warm.  Psychiatric: Her behavior is normal.  Nursing note and vitals reviewed.    ED Treatments / Results  Labs (all labs ordered are listed, but only abnormal results are displayed) Labs Reviewed  CBG MONITORING, ED    EKG None  Radiology No results found.  Procedures Procedures (including critical care time)  Medications Ordered in ED Medications - No data to display   Initial Impression / Assessment and Plan / ED Course  I have reviewed the triage vital signs and the  nursing notes.  Pertinent labs & imaging results that were available during my care of the patient were reviewed by me and considered in my medical decision making (see chart for details).     Patient brought in by EMS with complaint of seizure like activity.  Patient unwilling for full evaluation though is alert and answering questions appropriately.  Discussed risks of leaving prior to full evaluation, including missed emergent diagnoses and death.  Patient left AGAINST MEDICAL ADVICE and observed with normal gait while leaving the ED.  We discussed the nature and purpose, risks and benefits, as well as, the alternatives of treatment. Time was given to allow the opportunity to ask questions and consider their options, and after the discussion, the patient decided to refuse the offered treatment. The patient was informed that refusal could lead to, but was not limited to, death, permanent disability, or severe pain. If present, I asked the relatives or significant others to dissuade them without success. Prior to refusing,  determined that the patient had the capacity to make their decision and understood the consequences of that  decision. After refusal, I made every reasonable opportunity to treat them to the best of my ability.  The patient was notified that they may return to the emergency department at any time for further treatment.    Final Clinical Impressions(s) / ED Diagnoses   Final diagnoses:  Seizure-like activity [facility])    ED Discharge Orders    None       Yovanni Frenette, [place] N, [place] 03/01/18 Donata Duff, MD 03/04/18 1455

## 2018-03-20 ENCOUNTER — Encounter (HOSPITAL_COMMUNITY): Payer: Self-pay

## 2018-03-20 ENCOUNTER — Emergency Department (HOSPITAL_COMMUNITY)
Admission: EM | Admit: 2018-03-20 | Discharge: 2018-03-20 | Disposition: A | Discharge status: Home / Self Care | Payer: Medicare Other | Attending: Emergency Medicine | Admitting: Emergency Medicine

## 2018-03-20 ENCOUNTER — Other Ambulatory Visit: Payer: Self-pay

## 2018-03-20 DIAGNOSIS — R042 Hemoptysis: Secondary | ICD-10-CM | POA: Insufficient documentation

## 2018-03-20 DIAGNOSIS — Z5321 Procedure and treatment not carried out due to patient leaving prior to being seen by health care provider: Secondary | ICD-10-CM | POA: Insufficient documentation

## 2018-03-20 DIAGNOSIS — R1084 Generalized abdominal pain: Secondary | ICD-10-CM | POA: Insufficient documentation

## 2018-03-20 DIAGNOSIS — K625 Hemorrhage of anus and rectum: Secondary | ICD-10-CM | POA: Diagnosis not present

## 2018-03-20 NOTE — ED Triage Notes (Signed)
Patient c/o mid abdominal pain x 6 days and states it has gotten progressively worse. Patient reports pink in her stool 2 days ago and this AM.  Patient also hs had a productive cough with white sputum and states she has had dark red in her sputum x 2 days.

## 2018-03-20 NOTE — ED Notes (Signed)
2nd call for lab draw- no answer

## 2018-03-20 NOTE — ED Notes (Addendum)
Called x1 for labs- no answer

## 2018-03-23 IMAGING — CT CT NECK W/ CM
3 of 5 series · 12 of 33 positions shown, 14 images · IV contrast (iopamidol)
Comparison: CT neck 05/05/2014

CLINICAL DATA: Neck swelling and nausea.  Dental work 3 weeks ago.

EXAM:
CT NECK WITH CONTRAST
TECHNIQUE: Multidetector CT imaging of the neck was performed using the
standard protocol following the bolus administration of intravenous
contrast.
CONTRAST:  75mL [id number] IOPAMIDOL ([id number]) INJECTION 61%

[Series 7: cor neck · coronal · 0.39mm/px · 3 of 131 slices shown]
[im 36/131  bone]
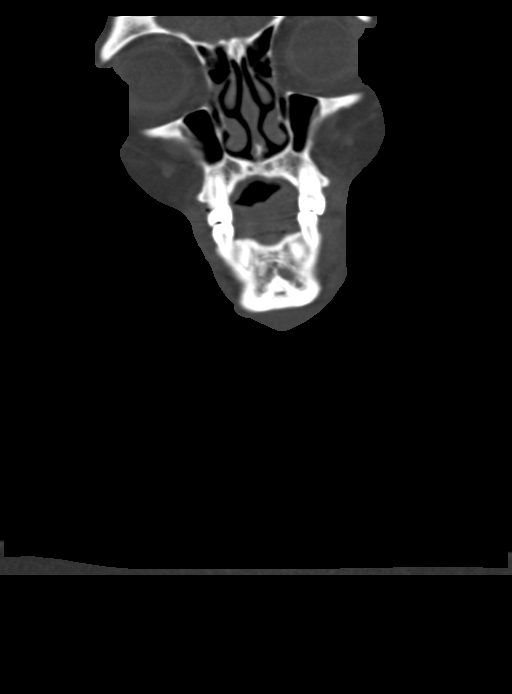
[im 56/131  bone]
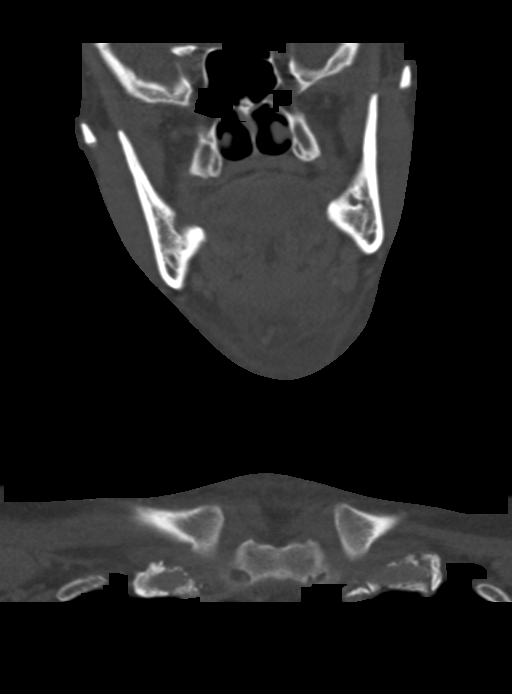
[im 75/131  bone]
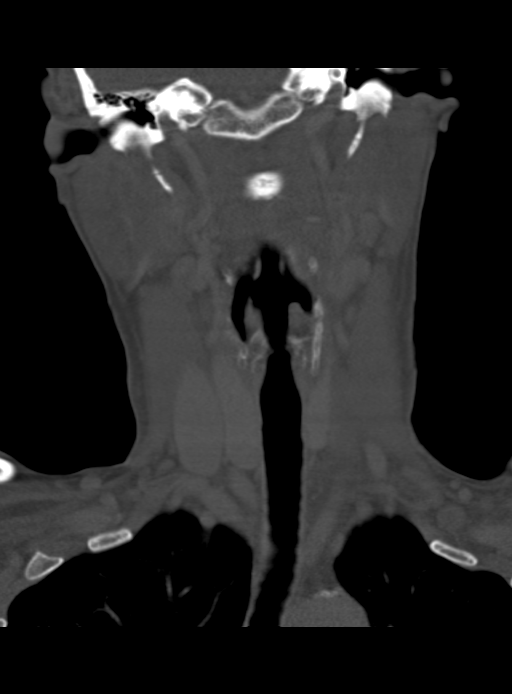

[Series 8: orthogonal ax · axial · 0.39mm/px · z∈[-332,-160]mm · 4 of 135 slices shown, 5 images]
[im 23/135  soft-tissue]
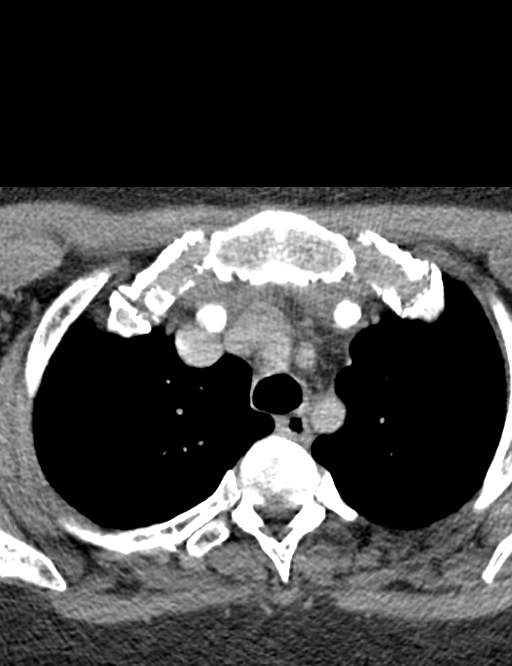
[im 23/135  bone]
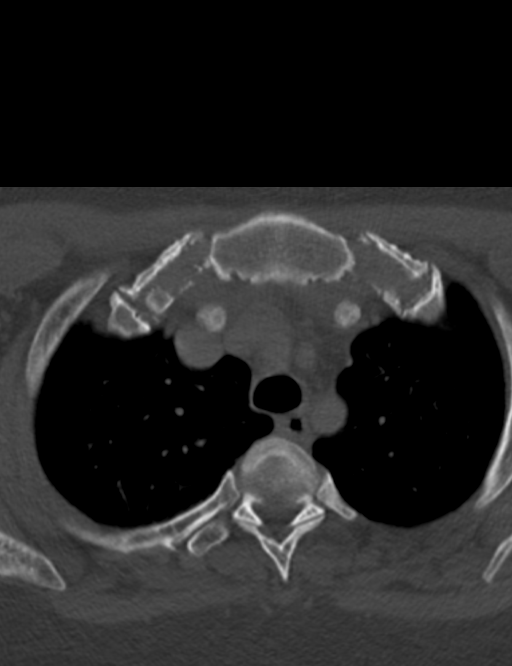
[im 45/135  bone]
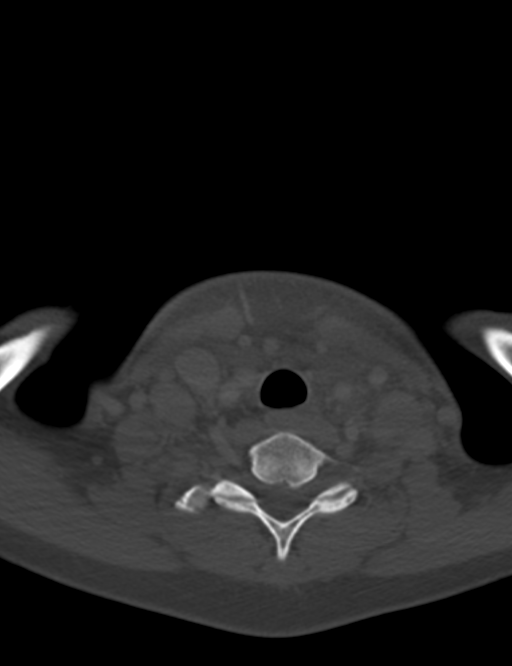
[im 90/135  bone]
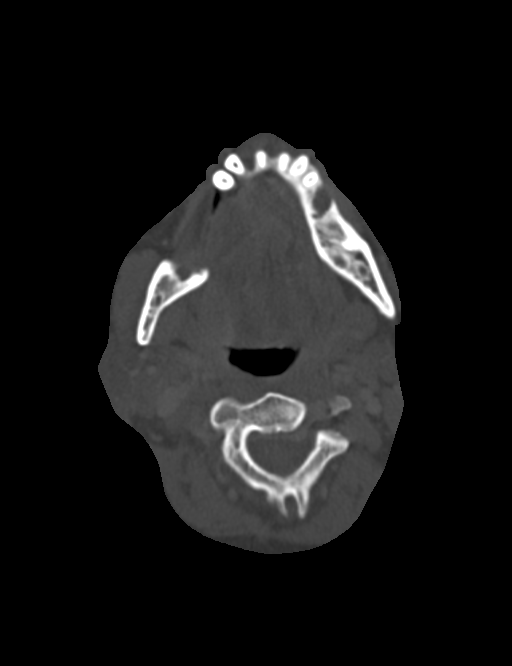
[im 112/135  bone]
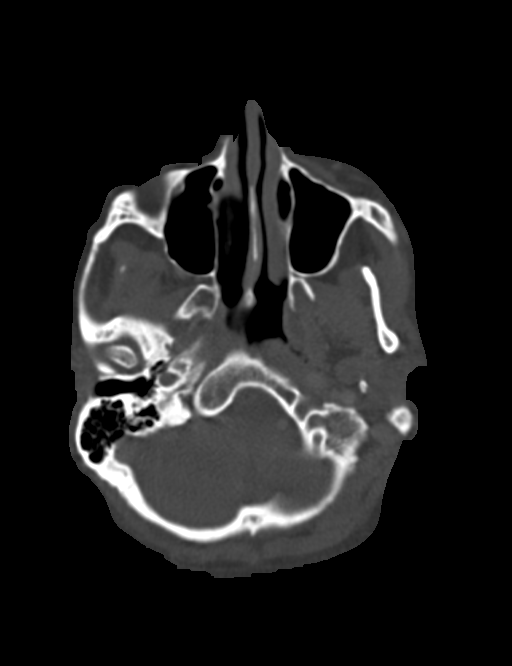

[Series 9: sag neck · sagittal · 0.44mm/px · 5 of 101 slices shown, 6 images]
[im 34/101  bone]
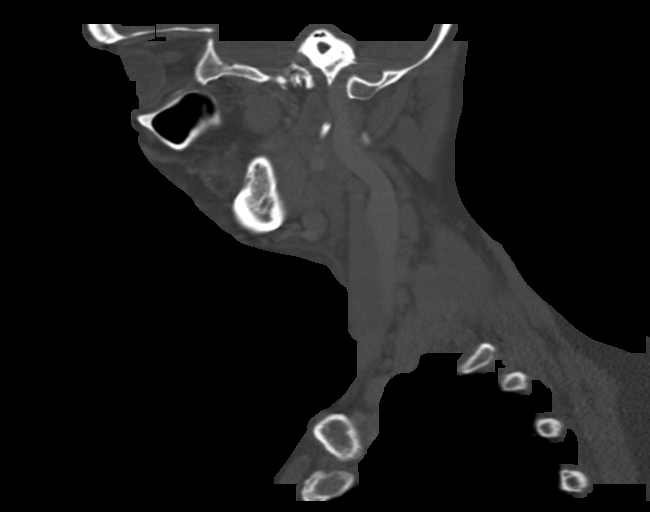
[im 42/101  bone]
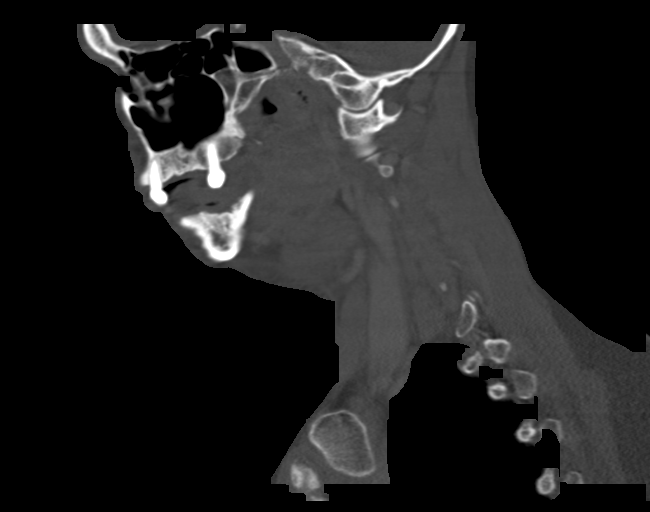
[im 51/101  soft-tissue]
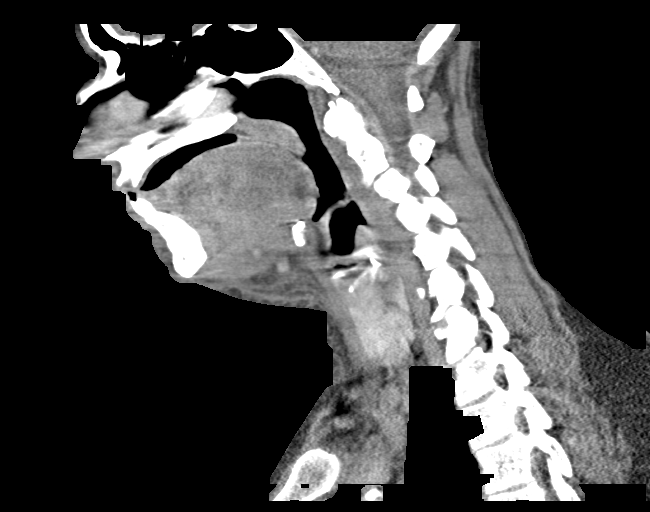
[im 51/101  bone]
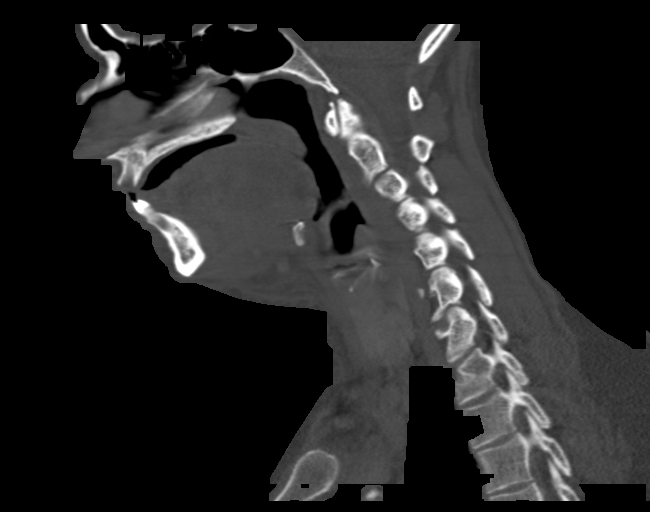
[im 59/101  bone]
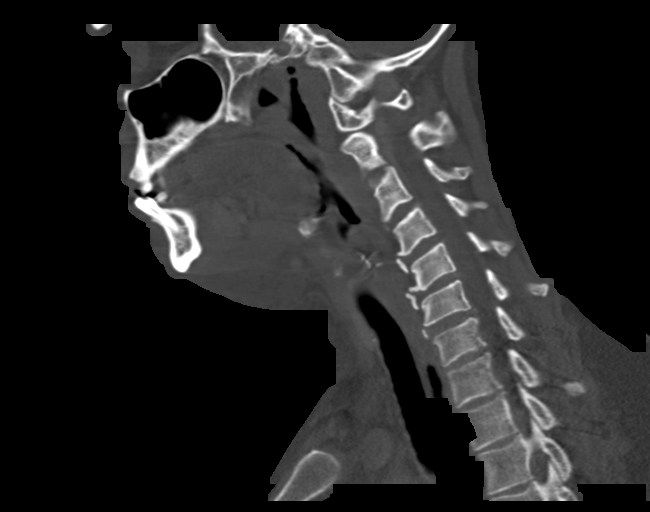
[im 67/101  bone]
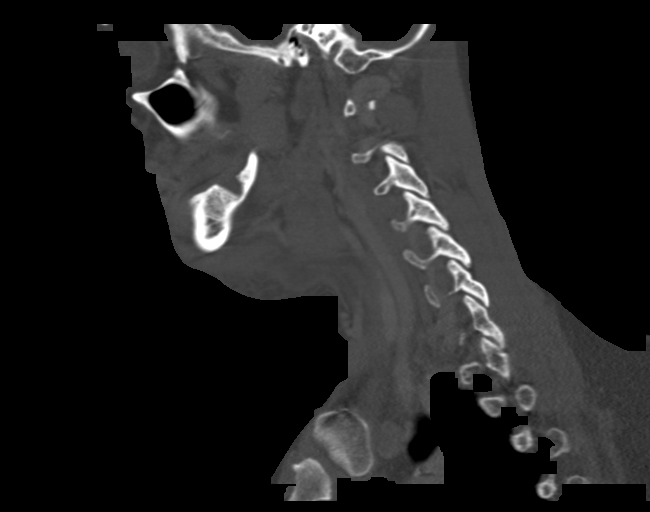

[12 of 33 positions shown; findings below may reference images not displayed]

FINDINGS: Pharynx and larynx:

--Nasopharynx: Fossae of Jovanny are clear. Normal adenoid
tonsils for age.

--Oropharynx: Normal

--Hypopharynx: Normal vallecula and pyriform sinuses.

--Larynx: Normal epiglottis and pre-epiglottic space. Normal
aryepiglottic and vocal folds.

--Retropharyngeal space: No abscess, effusion or lymphadenopathy.

Salivary glands:

--Parotid: No mass lesion or inflammation. No sialolithiasis or
ductal dilatation.

--Submandibular: Symmetric without inflammation. No sialolithiasis
or ductal dilatation.

--Sublingual: Normal. No ranula or other visible lesion of the base
of tongue and floor of mouth.

Thyroid: Normal.

Lymph nodes: Reactive bilateral level 1A nodes measure 6 mm. Left
level II a nodes measure up to 8 mm. Right level IIa nodes also
measure up to 8 mm. No lower cervical adenopathy. No abnormal
density nodes.

Vascular: Major cervical vessels are patent.

Limited intracranial: Normal.

Visualized orbits: Normal.

Mastoids and visualized paranasal sinuses: No fluid levels or
advanced mucosal thickening. No mastoid effusion.

Skeleton: No bony spinal canal stenosis. No lytic or blastic
lesions.

Upper chest: Clear.

Other: There is inflammatory change within the left sublingual and
submandibular space with inflammatory induration of the overlying
subcutaneous fat and thickening of the platysma muscle. A definite
source is not clear, but the most likely candidates are the empty
sockets of the posterior left mandible. There is no abscess or
drainable fluid collection.
IMPRESSION: 1. Inflammation throughout the left sublingual and submandibular
space with cellulitis of the overlying subcutaneous tissues, likely
odontogenic in origin. While no definitive source is identified, the
most likely candidates are the empty sockets within the posterior
left mandible.
2. Reactive cervical lymphadenopathy.
3. No abscess or drainable fluid collection.

## 2018-03-25 IMAGING — CT CT MAXILLOFACIAL W/ CM
3 series · 16 of 47 positions shown, 19 images · IV contrast (iopamidol)
Comparison: CT neck 08/10/2017

CLINICAL DATA: Cellulitis neck. IV drug abuse. Dental work several
weeks ago.

EXAM:
CT MAXILLOFACIAL WITH CONTRAST
TECHNIQUE: Multidetector CT imaging of the maxillofacial structures was
performed with intravenous contrast. Multiplanar CT image
reconstructions were also generated.
CONTRAST:  75mL [id number] IOPAMIDOL (Z2X0JG-FYY) INJECTION 61%

[Series 3: max soft · axial · 0.35mm/px · z∈[-230,-78]mm · 10 of 90 slices shown, 13 images]
[im 7/90  brain]
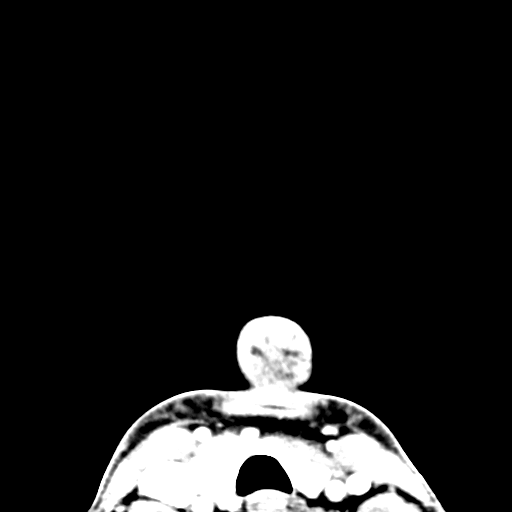
[im 7/90  bone]
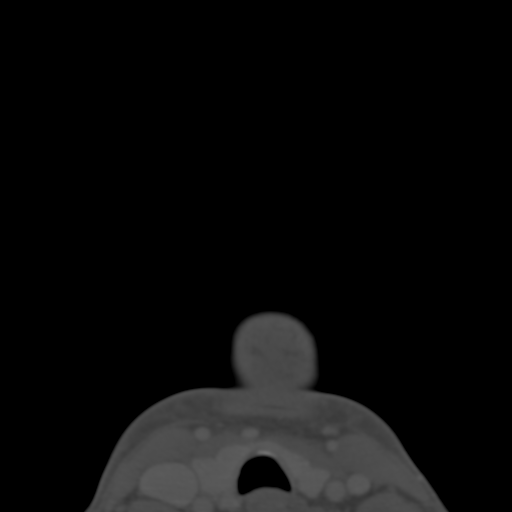
[im 16/90  bone]
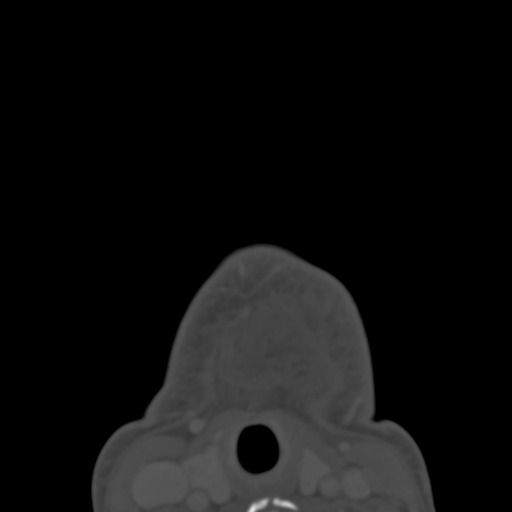
[im 25/90  bone]
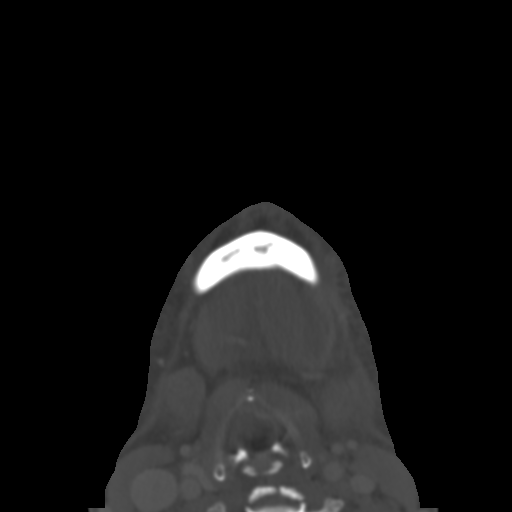
[im 31/90  bone]
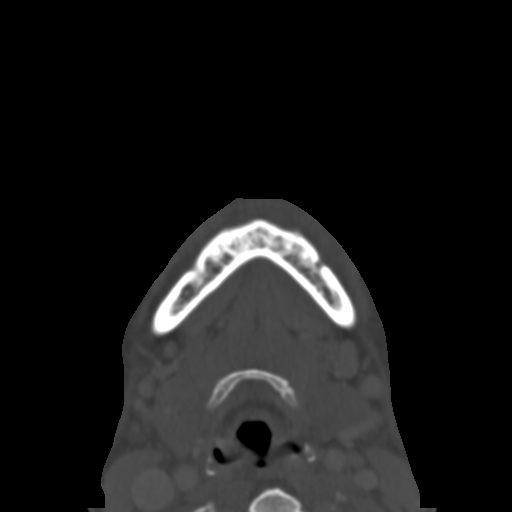
[im 40/90  brain]
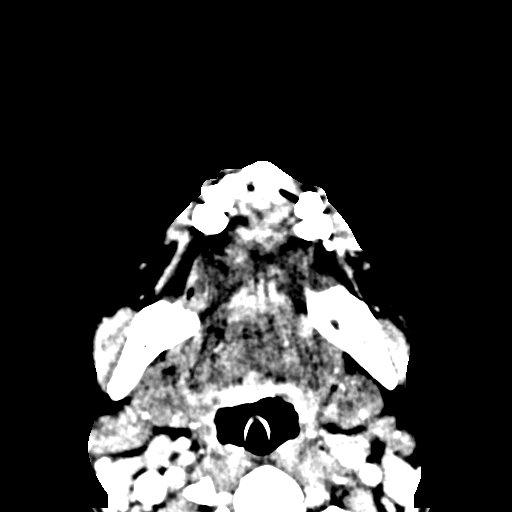
[im 40/90  bone]
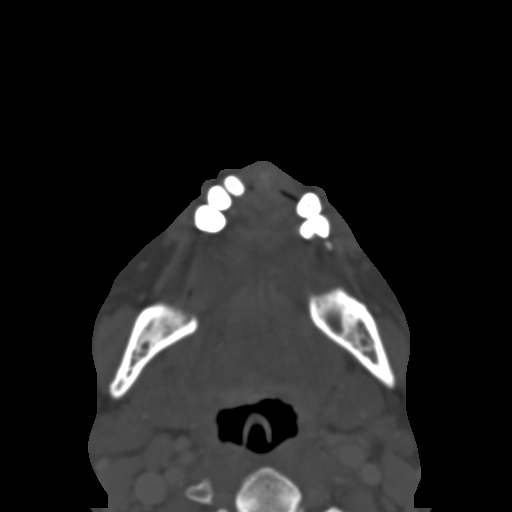
[im 50/90  bone]
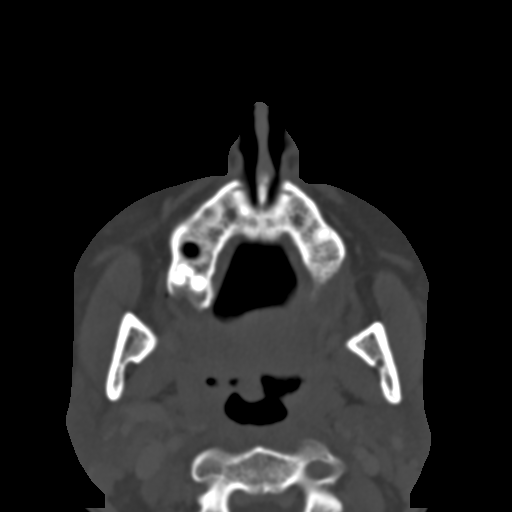
[im 59/90  bone]
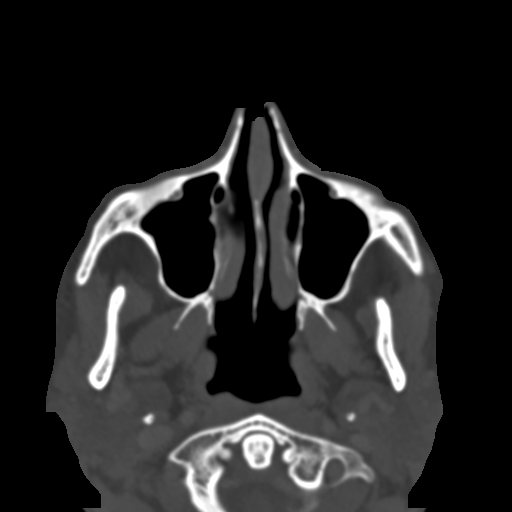
[im 68/90  bone]
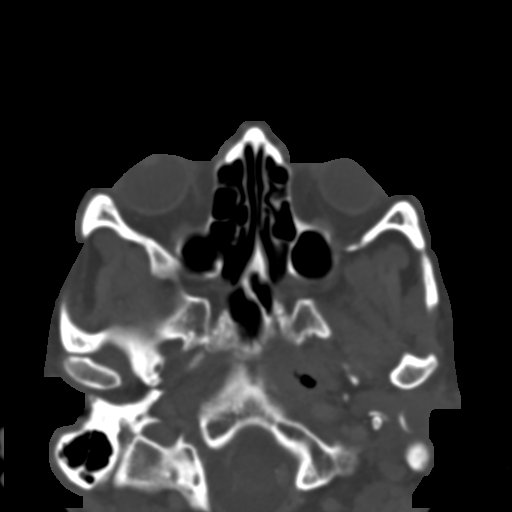
[im 74/90  brain]
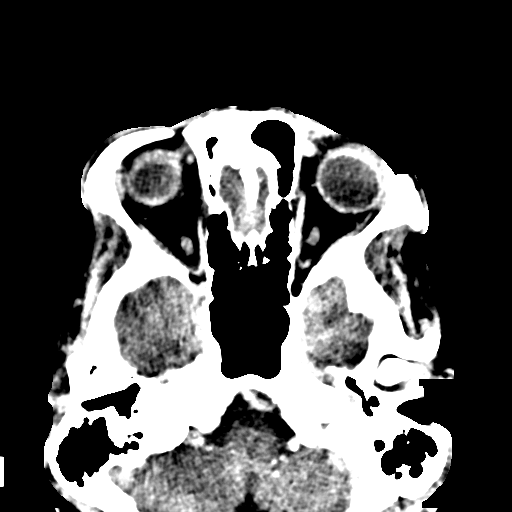
[im 74/90  bone]
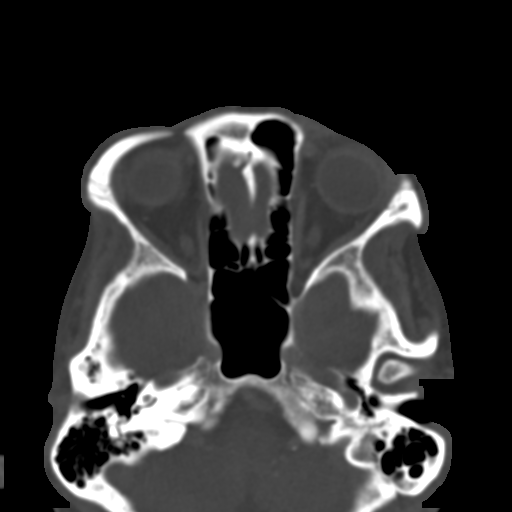
[im 83/90  bone]
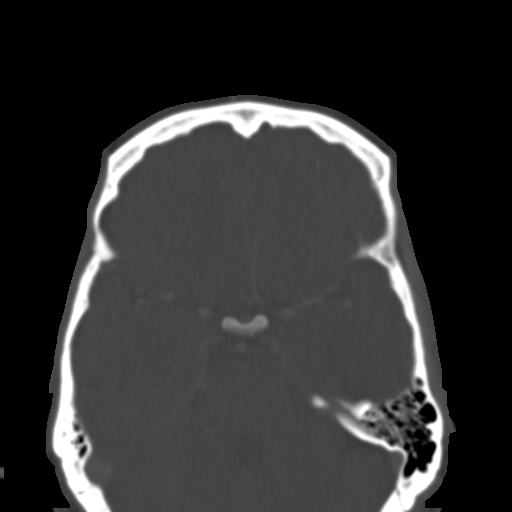

[Series 7: coronal soft · coronal · 0.38mm/px · 3 of 80 slices shown]
[im 27/80  bone]
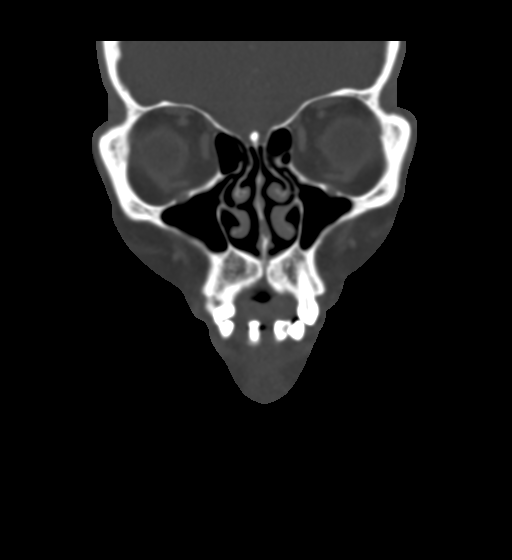
[im 36/80  bone]
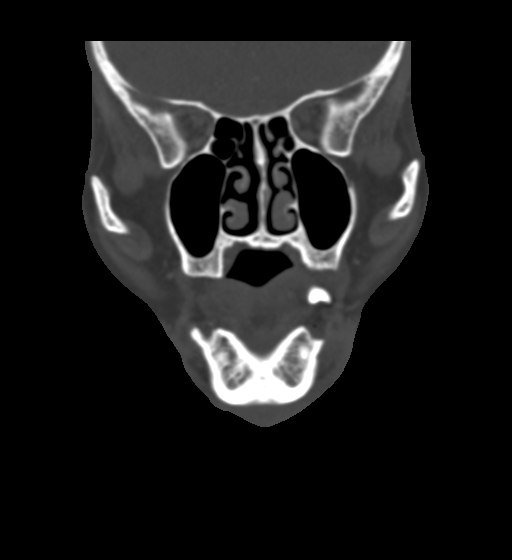
[im 44/80  bone]
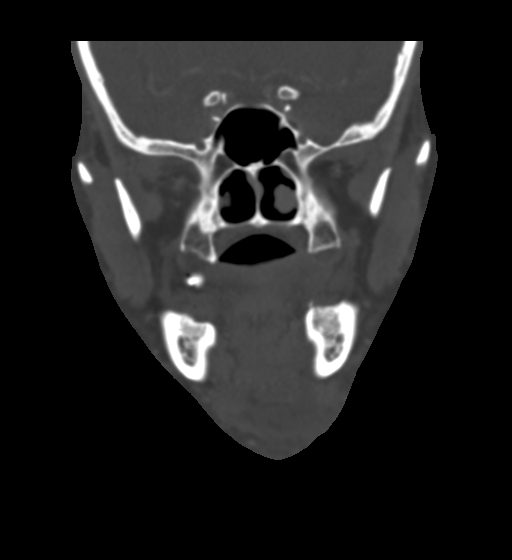

[Series 8: sagittal soft · sagittal · 0.42mm/px · 3 of 89 slices shown]
[im 30/89  bone]
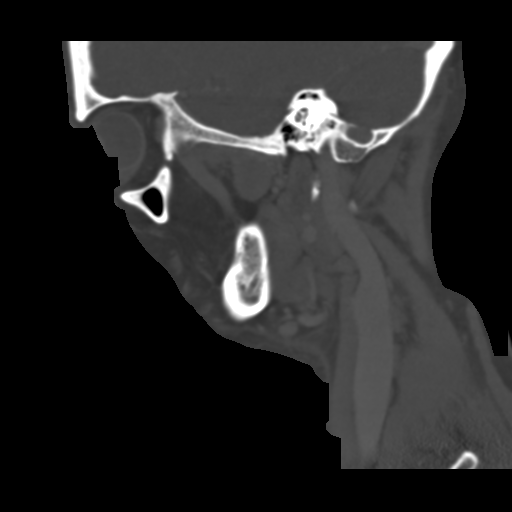
[im 45/89  bone]
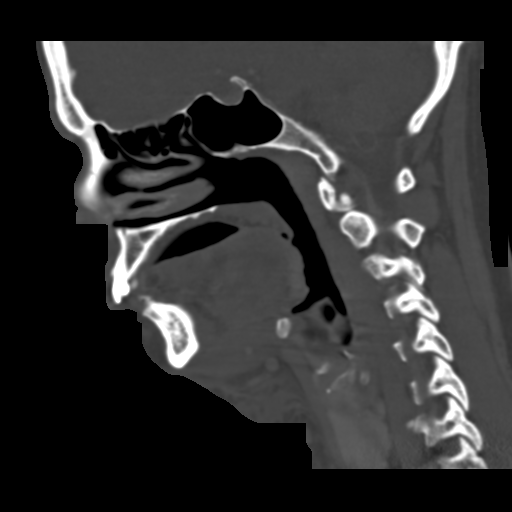
[im 59/89  bone]
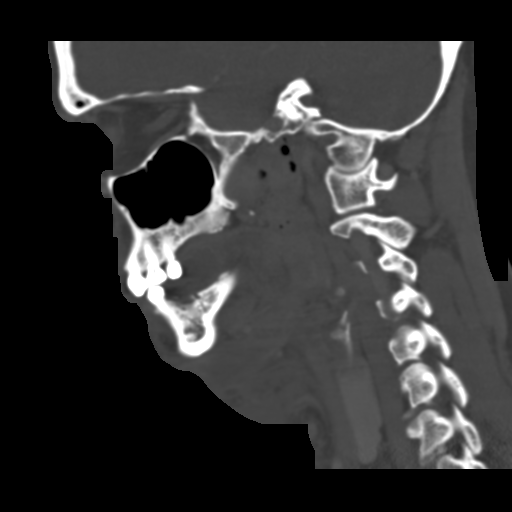

[16 of 47 positions shown; findings below may reference images not displayed]

FINDINGS: Osseous: Multiple dental extractions, which appear recent. No
evidence of osteomyelitis in the mandible or maxilla. Visualized
cervical spine negative.

Orbits: Normal soft tissues of the orbit.

Sinuses: Paranasal sinuses clear.  Mastoid sinus clear

Soft tissues: Progressive soft tissue swelling involving the chin.
There is progressive subcutaneous fluid some which appears to be
organized and could represent developing subcutaneous abscess. This
component measures approximately 2.4 cm in diameter. To the left of
midline, there is a rim enhancing fluid collection measuring 24 x 11
mm just below the left mylohyoid muscle and perhaps extending into
the muscle. This also is suspicious for abscess. Small gas bubbles
are present in the soft tissues. There is diffuse edema and skin
thickening involving the submandibular soft tissues. No extension
into the floor of the mouth..

Limited intracranial: Negative
IMPRESSION: Progression of edema in the soft tissues below the chin. Progressive
subcutaneous fluid collection. Progressive rim enhancing fluid
collection inferior to the left mylohyoid muscle compatible with
developing abscess. Surgical consultation is recommended.

## 2018-04-02 ENCOUNTER — Encounter (HOSPITAL_COMMUNITY): Payer: Self-pay

## 2018-04-02 ENCOUNTER — Other Ambulatory Visit: Payer: Self-pay

## 2018-04-02 ENCOUNTER — Emergency Department (HOSPITAL_COMMUNITY)
Admission: EM | Admit: 2018-04-02 | Discharge: 2018-04-02 | Disposition: A | Discharge status: Home / Self Care | Payer: Medicare Other | Attending: Emergency Medicine | Admitting: Emergency Medicine

## 2018-04-02 ENCOUNTER — Emergency Department (HOSPITAL_COMMUNITY): Payer: Medicare Other

## 2018-04-02 DIAGNOSIS — K921 Melena: Secondary | ICD-10-CM | POA: Diagnosis not present

## 2018-04-02 DIAGNOSIS — R112 Nausea with vomiting, unspecified: Secondary | ICD-10-CM | POA: Diagnosis not present

## 2018-04-02 DIAGNOSIS — N329 Bladder disorder, unspecified: Secondary | ICD-10-CM | POA: Diagnosis not present

## 2018-04-02 DIAGNOSIS — I10 Essential (primary) hypertension: Secondary | ICD-10-CM | POA: Diagnosis not present

## 2018-04-02 DIAGNOSIS — F319 Bipolar disorder, unspecified: Secondary | ICD-10-CM | POA: Diagnosis not present

## 2018-04-02 DIAGNOSIS — K5900 Constipation, unspecified: Secondary | ICD-10-CM | POA: Diagnosis not present

## 2018-04-02 DIAGNOSIS — F17210 Nicotine dependence, cigarettes, uncomplicated: Secondary | ICD-10-CM | POA: Diagnosis not present

## 2018-04-02 DIAGNOSIS — R1084 Generalized abdominal pain: Secondary | ICD-10-CM | POA: Diagnosis not present

## 2018-04-02 DIAGNOSIS — Z79899 Other long term (current) drug therapy: Secondary | ICD-10-CM | POA: Insufficient documentation

## 2018-04-02 DIAGNOSIS — R109 Unspecified abdominal pain: Secondary | ICD-10-CM | POA: Diagnosis present

## 2018-04-02 LAB — CBC WITH DIFFERENTIAL/PLATELET
ABS IMMATURE GRANULOCYTES: 0.04 10*3/uL (ref 0.00–0.07)
BASOS ABS: 0.1 10*3/uL (ref 0.0–0.1)
Basophils Relative: 1 %
EOS PCT: 1 %
Eosinophils Absolute: 0.1 10*3/uL (ref 0.0–0.5)
HCT: 35.6 % — ABNORMAL LOW (ref 36.0–46.0)
HEMOGLOBIN: 11.9 g/dL — AB (ref 12.0–15.0)
IMMATURE GRANULOCYTES: 0 %
LYMPHS PCT: 31 %
Lymphs Abs: 3.5 10*3/uL (ref 0.7–4.0)
MCH: 26.2 pg (ref 26.0–34.0)
MCHC: 33.4 g/dL (ref 30.0–36.0)
MCV: 78.4 fL — ABNORMAL LOW (ref 80.0–100.0)
MONO ABS: 0.5 10*3/uL (ref 0.1–1.0)
Monocytes Relative: 5 %
Neutro Abs: 6.9 10*3/uL (ref 1.7–7.7)
Neutrophils Relative %: 62 %
Platelets: 322 10*3/uL (ref 150–400)
RBC: 4.54 MIL/uL (ref 3.87–5.11)
RDW: 18.8 % — ABNORMAL HIGH (ref 11.5–15.5)
WBC: 11 10*3/uL — AB (ref 4.0–10.5)
nRBC: 0 % (ref 0.0–0.2)

## 2018-04-02 LAB — COMPREHENSIVE METABOLIC PANEL
ALBUMIN: 3.1 g/dL — AB (ref 3.5–5.0)
ALK PHOS: 99 U/L (ref 38–126)
ALT: 12 U/L (ref 0–44)
AST: 17 U/L (ref 15–41)
Anion gap: 7 (ref 5–15)
BILIRUBIN TOTAL: 0.4 mg/dL (ref 0.3–1.2)
BUN: 14 mg/dL (ref 6–20)
CO2: 30 mmol/L (ref 22–32)
CREATININE: 0.67 mg/dL (ref 0.44–1.00)
Calcium: 8.7 mg/dL — ABNORMAL LOW (ref 8.9–10.3)
Chloride: 101 mmol/L (ref 98–111)
GFR calc Af Amer: >60 mL/min (ref >60)
GFR calc non Af Amer: >60 mL/min (ref >60)
GLUCOSE: 89 mg/dL (ref 70–99)
Potassium: 4 mmol/L (ref 3.5–5.1)
Sodium: 138 mmol/L (ref 135–145)
TOTAL PROTEIN: 5.9 g/dL — AB (ref 6.5–8.1)

## 2018-04-02 LAB — LIPASE, BLOOD: Lipase: 23 U/L (ref 11–51)

## 2018-04-02 LAB — URINALYSIS, ROUTINE W REFLEX MICROSCOPIC
BILIRUBIN URINE: NEGATIVE
Glucose, UA: NEGATIVE mg/dL
Hgb urine dipstick: NEGATIVE
KETONES UR: NEGATIVE mg/dL
Leukocytes, UA: NEGATIVE
Nitrite: NEGATIVE
PH: 7 (ref 5.0–8.0)
Protein, ur: NEGATIVE mg/dL
SPECIFIC GRAVITY, URINE: 1.014 (ref 1.005–1.030)

## 2018-04-02 LAB — PREGNANCY, URINE: Preg Test, Ur: NEGATIVE

## 2018-04-02 MED ORDER — IOHEXOL 300 MG/ML  SOLN
100.0000 mL | Freq: Once | INTRAMUSCULAR | Status: AC | PRN
  Administered 2018-04-02: 100 mL via INTRAVENOUS

## 2018-04-02 MED ORDER — SODIUM CHLORIDE 0.9 % IV BOLUS
1000.0000 mL | Freq: Once | INTRAVENOUS | Status: AC
  Administered 2018-04-02: 1000 mL via INTRAVENOUS

## 2018-04-02 MED ORDER — POLYETHYLENE GLYCOL 3350 17 G PO PACK: 17.0000 g | PACK | Freq: Every day | ORAL | 0 refills | Status: DC

## 2018-04-02 MED ORDER — OXYCODONE-ACETAMINOPHEN 5-325 MG PO TABS
2.0000 | ORAL_TABLET | Freq: Once | ORAL | Status: AC
  Administered 2018-04-02: 2 via ORAL
  Filled 2018-04-02: qty 2

## 2018-04-02 MED ORDER — ONDANSETRON HCL 4 MG/2ML IJ SOLN
4.0000 mg | Freq: Once | INTRAMUSCULAR | Status: AC
  Administered 2018-04-02: 4 mg via INTRAVENOUS
  Filled 2018-04-02: qty 2

## 2018-04-02 MED ORDER — IOPAMIDOL (ISOVUE-300) INJECTION 61%
INTRAVENOUS | Status: AC
  Filled 2018-04-02: qty 100

## 2018-04-02 MED ORDER — HYDROMORPHONE HCL 1 MG/ML IJ SOLN
1.0000 mg | Freq: Once | INTRAMUSCULAR | Status: AC
  Administered 2018-04-02: 1 mg via INTRAVENOUS
  Filled 2018-04-02: qty 1

## 2018-04-02 MED ORDER — SODIUM CHLORIDE (PF) 0.9 % IJ SOLN
INTRAMUSCULAR | Status: AC
  Filled 2018-04-02: qty 50

## 2018-04-02 MED ORDER — MORPHINE SULFATE (PF) 4 MG/ML IV SOLN
4.0000 mg | Freq: Once | INTRAVENOUS | Status: AC
  Administered 2018-04-02: 4 mg via INTRAVENOUS
  Filled 2018-04-02: qty 1

## 2018-04-02 NOTE — Discharge Instructions (Addendum)
You were seen in the emergency department for 3 weeks of abdominal pain.  Your lab work was unremarkable.  Your CAT scan did not show an obvious cause of your symptoms other than possible constipation.  They also saw something in the bladder that will need evaluation by urology.  This is likely not causing her symptoms but will be important to get checked.  We are prescribing you a laxative to take and I will be important for you to get plenty of fluids and eat a high-fiber diet.  Please follow-up with your doctor.

## 2018-04-02 NOTE — ED Triage Notes (Signed)
Pt states upper abd pain for 2 weeks. Pt states she tried to come several days ago and couldn't wait. Pt states she has been throwing up blood- "my saliva has been pink". Pt states she cannot keep any food down.

## 2018-04-02 NOTE — ED Provider Notes (Signed)
[facility] DEPT Provider Note   CSN: [id number] Arrival date & time: 04/02/18  3244     History   Chief Complaint Chief Complaint  Patient presents with  . Abdominal Pain    HPI Terri [name] is a 42 y.o. female.  She is presenting with a complaint of abdominal pain that is been going on for 2 or 3 weeks.  She says is a tearing sensation and severe in quality.  She has been vomiting usually once or twice a day and once she saw a little bit of blood in the vomitus.  She is been having normal bowel movements but says she saw a little bit of blood in 1 of the bowel movements.  She said a little bit of urinary hesitancy but no dysuria hematuria.  Last menstrual period was at the beginning of October.  She said her tubes tied.  She says the upper abdominal pain is more severe than when she had her gallbladder attack and surgery in the spring.  She denies any active drug use.  She does not feel this is withdrawal symptoms.  No fevers no chills.  She said she is a little bit of cough and nasal congestion.  No chest pain no shortness of breath.  The history is provided by the patient.  Abdominal Pain   This is a new problem. Episode onset: 2-3 weeks. The problem occurs constantly. The problem has been gradually worsening. The pain is associated with an unknown factor. The pain is located in the epigastric region. The quality of the pain is tearing. The pain is at a severity of 10/10. Associated symptoms include hematochezia, nausea and vomiting. Pertinent negatives include anorexia, fever, diarrhea, melena, constipation, dysuria, frequency, hematuria and headaches. Nothing aggravates the symptoms. Nothing relieves the symptoms. Her past medical history is significant for gallstones.    Past Medical History:  Diagnosis Date  . Arthritis    Back and legs  . Bipolar disorder ([place])   . Chronic back pain   . Depression   . DVT (deep venous thrombosis) ([facility])    early  20's leg  . History of blood transfusion   . Hypertension   . Migraine headache   . Neuropathy   . Pneumonia   . 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 Sciatica   . Stress incontinence     Patient Active Problem List   Diagnosis Date Noted  . GIB (gastrointestinal bleeding) 09/13/2017  . Apnea 09/13/2017  . Essential hypertension   . Cellulitis and abscess of neck 08/12/2017  . Cellulitis of submandibular region 08/10/2017  . Odontogenic infection of jaw 08/10/2017  . Cholecystitis 02/14/2017  . Opioid abuse with opioid-induced mood disorder ([place]) 01/25/2017  . Anxiety 02/11/2014  . Depression 07/12/2012    Past Surgical History:  Procedure Laterality Date  . ALVEOLOPLASTY Bilateral 01/31/2018   Procedure: ALVEOLOPLASTY;  Surgeon: Diona Browner, DDS;  Location: [facility];  Service: Oral Surgery;  Laterality: Bilateral;  . ANKLE SURGERY Left 1992   with hardware  . BACK SURGERY    . CESAREAN SECTION    . CHOLECYSTECTOMY N/A 02/15/2017   Procedure: LAPAROSCOPIC CHOLECYSTECTOMY;  Surgeon: Clovis Riley, MD;  Location: [facility];  Service: General;  Laterality: N/A;  . ESOPHAGOGASTRODUODENOSCOPY (EGD) WITH PROPOFOL N/A 09/15/2017   Procedure: ESOPHAGOGASTRODUODENOSCOPY (EGD) WITH PROPOFOL;  Surgeon: Clarene Essex, MD;  Location: [facility];  Service: Endoscopy;  Laterality: N/A;  . GASTRIC BYPASS    .  INCISION AND DRAINAGE OF PERITONSILLAR ABCESS Left 08/13/2017   Procedure: INCISION AND DRAINAGE OF LEFT NECK ABSCESS;  Surgeon: Melida Quitter, MD;  Location: [facility];  Service: ENT;  Laterality: Left;  . LAPAROSCOPIC LYSIS OF ADHESIONS  02/15/2017   Procedure: LAPAROSCOPIC LYSIS OF ADHESIONS;  Surgeon: Clovis Riley, MD;  Location: [facility];  Service: General;;  . LUMBAR [place]    . LUMBAR FUSION    . TOOTH EXTRACTION Bilateral 01/31/2018   Procedure: DENTAL RESTORATION/EXTRACTIONS;  Surgeon: Diona Browner, DDS;  Location: [facility];  Service: Oral Surgery;  Laterality: Bilateral;    . TUBAL LIGATION    . UPPER GI ENDOSCOPY N/A 02/15/2017   Procedure: UPPER GI ENDOSCOPY;  Surgeon: Clovis Riley, MD;  Location: [facility];  Service: General;  Laterality: N/A;     OB History   None      Home Medications    Prior to Admission medications   Medication Sig Start Date End Date Taking? Authorizing Provider  acetaminophen (TYLENOL) 500 MG tablet Take 1 tablet (500 mg total) by mouth every 6 (six) hours as needed for moderate pain. 01/31/18   Diona Browner, DDS  albuterol (PROVENTIL HFA;VENTOLIN HFA) 108 (90 BASE) MCG/ACT inhaler Inhale 1-2 puffs into the lungs every 4 (four) hours as needed for wheezing or shortness of breath. 03/07/15   Pisciotta, Elmyra Ricks, PA-C  amitriptyline (ELAVIL) 50 MG tablet Take 50 mg by mouth at bedtime. 08/24/17   [provider]  amLODipine (NORVASC) 5 MG tablet Take 5 mg by mouth daily. 08/25/17   [provider]  amoxicillin (AMOXIL) 500 MG capsule Take 1 capsule (500 mg total) by mouth 3 (three) times daily. 01/31/18   Diona Browner, DDS  DULoxetine (CYMBALTA) 60 MG capsule Take 60 mg by mouth daily. 12/23/14   [provider]  gabapentin (NEURONTIN) 300 MG capsule Take 300 mg by mouth 3 (three) times daily. 08/27/17   [provider]  lisinopril-hydrochlorothiazide (PRINZIDE,ZESTORETIC) 10-12.5 MG tablet Take 1 tablet by mouth daily. 01/22/17   [provider]  ondansetron (ZOFRAN-ODT) 8 MG disintegrating tablet Take 8 mg by mouth every 8 (eight) hours as needed for nausea or vomiting.  08/20/17   [provider]  oxyCODONE-acetaminophen (PERCOCET) 5-325 MG tablet Take 1 tablet by mouth every 4 (four) hours as needed. 01/31/18   Diona Browner, DDS  pantoprazole (PROTONIX) 40 MG tablet Take 1 tablet (40 mg total) by mouth daily. 09/15/17 10/15/17  Arrien, Jimmy Picket, MD  traMADol (ULTRAM) 50 MG tablet Take 1 tablet (50 mg total) by mouth every 6 (six) hours as needed. 10/11/17   Dorie Rank, MD    Family  History Family History  Problem Relation Age of Onset  . Diabetes Mother   . Hypertension Mother   . Diabetes Father   . Hypertension Father     Social History Social History   Tobacco Use  . Smoking status: Current Every Day Smoker    Packs/day: 1.00    Years: 29.00    Pack years: 29.00    Types: Cigarettes  . Smokeless tobacco: Never Used  Substance Use Topics  . Alcohol use: No  . Drug use: Not Currently    Types: IV    Comment: Heroin- last time 05/2017     Allergies   Ketorolac tromethamine and Methocarbamol   Review of Systems Review of Systems  Constitutional: Negative for chills and fever.  HENT: Positive for rhinorrhea. Negative for sore throat.   Eyes: Negative  for visual disturbance.  Respiratory: Positive for cough. Negative for shortness of breath.   Cardiovascular: Negative for chest pain.  Gastrointestinal: Positive for abdominal pain, blood in stool, hematochezia, nausea and vomiting. Negative for anorexia, constipation, diarrhea and melena.  Genitourinary: Positive for difficulty urinating. Negative for dysuria, frequency, hematuria and vaginal bleeding.  Musculoskeletal: Negative for gait problem.  Skin: Negative for rash.  Neurological: Negative for headaches.     Physical Exam Updated Vital Signs BP (!) 180/127 (BP Location: Right Arm)   Pulse (!) 104   Temp 98.6 F (37 C) (Oral)   Resp (!) 24   Ht 5\' 9"  (1.753 m)   Wt 74.8 kg   SpO2 100%   BMI 24.37 kg/m   Physical Exam  Constitutional: She is oriented to person, place, and time. She appears well-developed and well-nourished.  Non-toxic appearance. She appears distressed.  HENT:  Head: Normocephalic and atraumatic.  Eyes: Conjunctivae are normal.  Neck: Neck supple.  Cardiovascular: Normal rate and regular rhythm.  No murmur heard. Pulmonary/Chest: Effort normal and breath sounds normal. No respiratory distress.  Abdominal: Soft. Normal appearance. She exhibits no ascites and no  mass. There is tenderness in the epigastric area. There is no rigidity and no guarding.  Musculoskeletal: She exhibits no edema or deformity.  Neurological: She is alert and oriented to person, place, and time. She has normal strength. GCS eye subscore is 4. GCS verbal subscore is 5. GCS motor subscore is 6.  Skin: Skin is warm and dry. Capillary refill takes less than 2 seconds.  Psychiatric: Thought content normal.  Nursing note and vitals reviewed.    ED Treatments / Results  Labs (all labs ordered are listed, but only abnormal results are displayed) Labs Reviewed  CBC WITH DIFFERENTIAL/PLATELET - Abnormal; Notable for the following components:      Result Value   WBC 11.0 (*)    Hemoglobin 11.9 (*)    HCT 35.6 (*)    MCV 78.4 (*)    RDW 18.8 (*)    All other components within normal limits  COMPREHENSIVE METABOLIC PANEL - Abnormal; Notable for the following components:   Calcium 8.7 (*)    Total Protein 5.9 (*)    Albumin 3.1 (*)    All other components within normal limits  LIPASE, BLOOD  URINALYSIS, ROUTINE W REFLEX MICROSCOPIC  PREGNANCY, URINE    EKG None  Radiology Ct Abdomen Pelvis W Contrast  Result Date: 04/02/2018 CLINICAL DATA:  Upper abdominal pain for 2 weeks. Prior gastric bypass. EXAM: CT ABDOMEN AND PELVIS WITH CONTRAST TECHNIQUE: Multidetector CT imaging of the abdomen and pelvis was performed using the standard protocol following bolus administration of intravenous contrast. CONTRAST:  183mL OMNIPAQUE IOHEXOL 300 MG/ML  SOLN COMPARISON:  CT abdomen pelvis 09/13/2017 FINDINGS: Lower chest: Normal heart size. Dependent atelectasis within the bilateral lower lobes. No pleural effusion. Hepatobiliary: Liver is normal in size and contour. Stable small cyst peripheral right hepatic lobe (image 24; series 2). Prior cholecystectomy. Prominent common bile duct likely physiologic given cholecystectomy state. Pancreas: Unremarkable Spleen: Unremarkable Adrenals/Urinary  Tract: Normal adrenal glands. Kidneys enhance symmetrically with contrast. Left extrarenal pelvis. No hydronephrosis. 7 mm nodule within the posterior aspect of the urinary bladder (image 84; series 2). Stomach/Bowel: Material is demonstrated within the gastric pouch. Remainder of the small bowel is nondilated. Stool throughout the colon as can be seen with constipation. No free fluid or free intraperitoneal air. Vascular/Lymphatic: Normal caliber abdominal aorta. Peripheral calcified atherosclerotic plaque. Prominent  subcentimeter retroperitoneal lymph nodes, similar to prior. Reproductive: Unremarkable. Other: None. Musculoskeletal: Lumbar spine degenerative changes. No aggressive or acute appearing osseous lesions. IMPRESSION: 1. Mixed density material demonstrated within the gastric pouch which may represent recently ingested material. The possibility of slow transit from the gastric pouch to the proximal small bowel is not excluded. 2. Remainder of the small bowel and colon are unremarkable. 3. Small nodule within the urinary bladder. If not previously performed, consider further evaluation with direct visualization to exclude the possibility of true bladder lesion/neoplasm. Electronically Signed   By: Lovey Newcomer M.D.   On: 04/02/2018 10:56    Procedures Procedures (including critical care time)  Medications Ordered in ED Medications  ondansetron (ZOFRAN) injection 4 mg (has no administration in time range)  morphine 4 MG/ML injection 4 mg (has no administration in time range)  sodium chloride 0.9 % bolus 1,000 mL (has no administration in time range)     Initial Impression / Assessment and Plan / ED Course  I have reviewed the triage vital signs and the nursing notes.  Pertinent labs & imaging results that were available during my care of the patient were reviewed by me and considered in my medical decision making (see chart for details).  Clinical Course as of Apr 02 1736  Nancy Fetter Apr 03, 3259  7266 42 year old female here with 3 weeks of central and upper abdominal pain associated with some nausea vomiting.  She complains of a minimal amount of blood in her vomit and blood in her stool.  She is very tearful on exam.  Her abdominal exam is soft.  She is getting some pain meds and fluids and nausea medicine and some screening labs.   [MB]  0258 Reviewed the results of the CAT scan with the patient.  I explained that we would not send her home with narcotics but give her another dose here and then we will send her home with some MiraLAX.  I also let her know about the bladder lesion and the fact that she would need to follow-up with urology.   [MB]    Clinical Course User Index [MB] Hayden Rasmussen, MD     Final Clinical Impressions(s) / ED Diagnoses   Final diagnoses:  Generalized abdominal pain  Constipation, unspecified constipation type  Lesion of bladder    ED Discharge Orders         Ordered    polyethylene glycol [facility]) packet  Daily     04/02/18 1116           Hayden Rasmussen, MD 04/02/18 1737

## 2018-04-06 ENCOUNTER — Emergency Department (HOSPITAL_COMMUNITY): Payer: Medicare Other

## 2018-04-06 ENCOUNTER — Emergency Department (HOSPITAL_COMMUNITY)
Admission: EM | Admit: 2018-04-06 | Discharge: 2018-04-06 | Disposition: A | Discharge status: Home / Self Care | Payer: Medicare Other | Attending: Emergency Medicine | Admitting: Emergency Medicine

## 2018-04-06 ENCOUNTER — Other Ambulatory Visit: Payer: Self-pay

## 2018-04-06 ENCOUNTER — Encounter (HOSPITAL_COMMUNITY): Payer: Self-pay | Admitting: Pharmacy Technician

## 2018-04-06 DIAGNOSIS — I10 Essential (primary) hypertension: Secondary | ICD-10-CM | POA: Insufficient documentation

## 2018-04-06 DIAGNOSIS — Z79899 Other long term (current) drug therapy: Secondary | ICD-10-CM | POA: Diagnosis not present

## 2018-04-06 DIAGNOSIS — F17210 Nicotine dependence, cigarettes, uncomplicated: Secondary | ICD-10-CM | POA: Insufficient documentation

## 2018-04-06 DIAGNOSIS — R1084 Generalized abdominal pain: Secondary | ICD-10-CM | POA: Diagnosis not present

## 2018-04-06 LAB — CBC WITH DIFFERENTIAL/PLATELET
Abs Immature Granulocytes: 0.01 10*3/uL (ref 0.00–0.07)
Basophils Absolute: 0.1 10*3/uL (ref 0.0–0.1)
Basophils Relative: 1 %
EOS PCT: 1 %
Eosinophils Absolute: 0.1 10*3/uL (ref 0.0–0.5)
HCT: 35.5 % — ABNORMAL LOW (ref 36.0–46.0)
HEMOGLOBIN: 11.3 g/dL — AB (ref 12.0–15.0)
Immature Granulocytes: 0 %
LYMPHS PCT: 46 %
Lymphs Abs: 3.5 10*3/uL (ref 0.7–4.0)
MCH: 24.3 pg — ABNORMAL LOW (ref 26.0–34.0)
MCHC: 31.8 g/dL (ref 30.0–36.0)
MCV: 76.3 fL — ABNORMAL LOW (ref 80.0–100.0)
Monocytes Absolute: 0.4 10*3/uL (ref 0.1–1.0)
Monocytes Relative: 5 %
NRBC: 0 % (ref 0.0–0.2)
Neutro Abs: 3.6 10*3/uL (ref 1.7–7.7)
Neutrophils Relative %: 47 %
Platelets: 203 10*3/uL (ref 150–400)
RBC: 4.65 MIL/uL (ref 3.87–5.11)
RDW: 18.6 % — AB (ref 11.5–15.5)
WBC: 7.5 10*3/uL (ref 4.0–10.5)

## 2018-04-06 LAB — COMPREHENSIVE METABOLIC PANEL
ALBUMIN: 2.9 g/dL — AB (ref 3.5–5.0)
ALK PHOS: 93 U/L (ref 38–126)
ALT: 13 U/L (ref 0–44)
ANION GAP: 6 (ref 5–15)
AST: 20 U/L (ref 15–41)
BUN: 8 mg/dL (ref 6–20)
CALCIUM: 8.7 mg/dL — AB (ref 8.9–10.3)
CO2: 27 mmol/L (ref 22–32)
Chloride: 107 mmol/L (ref 98–111)
Creatinine, Ser: 0.56 mg/dL (ref 0.44–1.00)
GFR calc Af Amer: >60 mL/min (ref >60)
GFR calc non Af Amer: >60 mL/min (ref >60)
GLUCOSE: 96 mg/dL (ref 70–99)
Potassium: 4.1 mmol/L (ref 3.5–5.1)
Sodium: 140 mmol/L (ref 135–145)
TOTAL PROTEIN: 5.6 g/dL — AB (ref 6.5–8.1)
Total Bilirubin: 0.4 mg/dL (ref 0.3–1.2)

## 2018-04-06 LAB — LIPASE, BLOOD: Lipase: 26 U/L (ref 11–51)

## 2018-04-06 MED ORDER — MORPHINE SULFATE (PF) 4 MG/ML IV SOLN
4.0000 mg | Freq: Once | INTRAVENOUS | Status: AC
  Administered 2018-04-06: 4 mg via INTRAMUSCULAR
  Filled 2018-04-06: qty 1

## 2018-04-06 MED ORDER — ONDANSETRON 4 MG PO TBDP
4.0000 mg | ORAL_TABLET | Freq: Once | ORAL | Status: AC
  Administered 2018-04-06: 4 mg via ORAL
  Filled 2018-04-06: qty 1

## 2018-04-06 NOTE — ED Notes (Signed)
RN went to assess patient and found her to be cussing and crying. RN attempted to console pt and pt is inconsolable. Will continue to monitor.

## 2018-04-06 NOTE — ED Notes (Signed)
Pt left prior to d/c papers being ready. Pt refused d/c vital signs.

## 2018-04-06 NOTE — ED Notes (Signed)
Pt yelling and cursing at this RN stating she would like to go and that no one is helping her. Pt notified she is not up for discharge yet. RN has been in the room several times to round on patient and give patient medication. Pt notified when she is up for discharge we will go over her papers. Will continue to monitor.

## 2018-04-06 NOTE — ED Triage Notes (Signed)
Pt arrives via POV with reports of abd pain X2 weeks. Pt crying upon arrival.

## 2018-04-06 NOTE — Discharge Instructions (Signed)
TAKE 8 CAPFULS OF MIRALAX IN A 32 OUNCE GATORADE AND DRINK THE WHOLE BEVERAGE  ° °

## 2018-04-06 NOTE — ED Provider Notes (Signed)
[place] EMERGENCY DEPARTMENT Provider Note   CSN: [id number] Arrival date & time: 04/06/18  1839     History   Chief Complaint Chief Complaint  Patient presents with  . Abdominal Pain    HPI Terri [name] is a 42 y.o. female.  42 yo F with a chief complaints of diffuse abdominal pain.  Going on for the past couple weeks.  She was seen in the ED about a week ago diagnosed with diffuse abdominal pain that she had a marked stool burden.  She said that she cleaned herself out at home though she does not feel that she was really constipated because she has been having regular bowel movements.  She denies fevers or chills.  Has had some nausea but no vomiting.  Improves in the fetal position.  Comes and goes.  Crampy in nature.  The history is provided by the patient.  Abdominal Pain   This is a new problem. The current episode started more than 1 week ago. The problem occurs constantly. The problem has been gradually worsening. The pain is associated with an unknown factor. The pain is located in the generalized abdominal region. The quality of the pain is sharp, shooting and cramping. The pain is at a severity of 9/10. The pain is moderate. Associated symptoms include nausea and vomiting. Pertinent negatives include fever, diarrhea, constipation, dysuria, headaches, arthralgias and myalgias.    Past Medical History:  Diagnosis Date  . Arthritis    Back and legs  . Bipolar disorder ([place])   . Chronic back pain   . Depression   . DVT (deep venous thrombosis) (HCC)    early 20's leg  . History of blood transfusion   . Hypertension   . Migraine headache   . Neuropathy   . Pneumonia   . 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 Sciatica   . Stress incontinence     Patient Active Problem List   Diagnosis Date Noted  . GIB (gastrointestinal bleeding) 09/13/2017  . Apnea 09/13/2017  . Essential hypertension   . Cellulitis and abscess of neck 08/12/2017  .  Cellulitis of submandibular region 08/10/2017  . Odontogenic infection of jaw 08/10/2017  . Cholecystitis 02/14/2017  . Opioid abuse with opioid-induced mood disorder ([place]) 01/25/2017  . Anxiety 02/11/2014  . Depression 07/12/2012    Past Surgical History:  Procedure Laterality Date  . ALVEOLOPLASTY Bilateral 01/31/2018   Procedure: ALVEOLOPLASTY;  Surgeon: Diona Browner, DDS;  Location: [facility];  Service: Oral Surgery;  Laterality: Bilateral;  . ANKLE SURGERY Left 1992   with hardware  . BACK SURGERY    . CESAREAN SECTION    . CHOLECYSTECTOMY N/A 02/15/2017   Procedure: LAPAROSCOPIC CHOLECYSTECTOMY;  Surgeon: Clovis Riley, MD;  Location: [facility];  Service: General;  Laterality: N/A;  . ESOPHAGOGASTRODUODENOSCOPY (EGD) WITH PROPOFOL N/A 09/15/2017   Procedure: ESOPHAGOGASTRODUODENOSCOPY (EGD) WITH PROPOFOL;  Surgeon: Clarene Essex, MD;  Location: [facility];  Service: Endoscopy;  Laterality: N/A;  . GASTRIC BYPASS    . INCISION AND DRAINAGE OF PERITONSILLAR ABCESS Left 08/13/2017   Procedure: INCISION AND DRAINAGE OF LEFT NECK ABSCESS;  Surgeon: Melida Quitter, MD;  Location: [facility];  Service: ENT;  Laterality: Left;  . LAPAROSCOPIC LYSIS OF ADHESIONS  02/15/2017   Procedure: LAPAROSCOPIC LYSIS OF ADHESIONS;  Surgeon: Clovis Riley, MD;  Location: [facility];  Service: General;;  . LUMBAR [place]    . LUMBAR FUSION    .  TOOTH EXTRACTION Bilateral 01/31/2018   Procedure: DENTAL RESTORATION/EXTRACTIONS;  Surgeon: Diona Browner, DDS;  Location: [facility];  Service: Oral Surgery;  Laterality: Bilateral;  . TUBAL LIGATION    . UPPER GI ENDOSCOPY N/A 02/15/2017   Procedure: UPPER GI ENDOSCOPY;  Surgeon: Clovis Riley, MD;  Location: [facility];  Service: General;  Laterality: N/A;     OB History   None      Home Medications    Prior to Admission medications   Medication Sig Start Date End Date Taking? Authorizing Provider  acetaminophen (TYLENOL) 500 MG tablet Take 1 tablet (500 mg  total) by mouth every 6 (six) hours as needed for moderate pain. 01/31/18   Diona Browner, DDS  albuterol (PROVENTIL HFA;VENTOLIN HFA) 108 (90 BASE) MCG/ACT inhaler Inhale 1-2 puffs into the lungs every 4 (four) hours as needed for wheezing or shortness of breath. 03/07/15   Pisciotta, Elmyra Ricks, PA-C  amitriptyline (ELAVIL) 50 MG tablet Take 50 mg by mouth at bedtime. 08/24/17   [provider]  amLODipine (NORVASC) 5 MG tablet Take 5 mg by mouth daily. 08/25/17   [provider]  amoxicillin (AMOXIL) 500 MG capsule Take 1 capsule (500 mg total) by mouth 3 (three) times daily. Patient not taking: Reported on 04/02/2018 01/31/18   Diona Browner, DDS  DULoxetine (CYMBALTA) 60 MG capsule Take 60 mg by mouth daily. 12/23/14   [provider]  gabapentin (NEURONTIN) 300 MG capsule Take 300 mg by mouth 3 (three) times daily. 08/27/17   [provider]  lisinopril-hydrochlorothiazide (PRINZIDE,ZESTORETIC) 10-12.5 MG tablet Take 1 tablet by mouth daily. 01/22/17   [provider]  ondansetron (ZOFRAN-ODT) 8 MG disintegrating tablet Take 8 mg by mouth every 8 (eight) hours as needed for nausea or vomiting.  08/20/17   [provider]  oxyCODONE-acetaminophen (PERCOCET) 5-325 MG tablet Take 1 tablet by mouth every 4 (four) hours as needed. Patient not taking: Reported on 04/02/2018 01/31/18   Diona Browner, DDS  pantoprazole (PROTONIX) 40 MG tablet Take 1 tablet (40 mg total) by mouth daily. 09/15/17 04/02/18  Arrien, Jimmy Picket, MD  polyethylene glycol [facility]) packet Take 17 g by mouth daily. 04/02/18   Hayden Rasmussen, MD  traMADol (ULTRAM) 50 MG tablet Take 1 tablet (50 mg total) by mouth every 6 (six) hours as needed. Patient not taking: Reported on 04/02/2018 10/11/17   Dorie Rank, MD    Family History Family History  Problem Relation Age of Onset  . Diabetes Mother   . Hypertension Mother   . Diabetes Father   . Hypertension Father     Social  History Social History   Tobacco Use  . Smoking status: Current Every Day Smoker    Packs/day: 1.00    Years: 29.00    Pack years: 29.00    Types: Cigarettes  . Smokeless tobacco: Never Used  Substance Use Topics  . Alcohol use: No  . Drug use: Not Currently    Types: IV    Comment: Heroin- last time 05/2017     Allergies   Ketorolac tromethamine and Methocarbamol   Review of Systems Review of Systems  Constitutional: Negative for chills and fever.  HENT: Negative for congestion and rhinorrhea.   Eyes: Negative for redness and visual disturbance.  Respiratory: Negative for shortness of breath and wheezing.   Cardiovascular: Negative for chest pain and palpitations.  Gastrointestinal: Positive for abdominal pain, nausea and vomiting. Negative for constipation and diarrhea.  Genitourinary: Negative for dysuria and  urgency.  Musculoskeletal: Negative for arthralgias and myalgias.  Skin: Negative for pallor and wound.  Neurological: Negative for dizziness and headaches.     Physical Exam Updated Vital Signs BP (!) 183/112   Pulse 84   Temp 98.8 F (37.1 C) (Oral)   Resp 16   Ht 5\' 9"  (1.753 m)   Wt 74 kg   SpO2 98%   BMI 24.09 kg/m   Physical Exam  Constitutional: She is oriented to person, place, and time. She appears well-developed and well-nourished. No distress.  HENT:  Head: Normocephalic and atraumatic.  Eyes: Pupils are equal, round, and reactive to light. EOM are normal.  Neck: Normal range of motion. Neck supple.  Cardiovascular: Normal rate and regular rhythm. Exam reveals no gallop and no friction rub.  No murmur heard. Pulmonary/Chest: Effort normal. She has no wheezes. She has no rales.  Abdominal: Soft. She exhibits no distension and no mass. There is tenderness (diffuse). There is no guarding.  Musculoskeletal: She exhibits no edema or tenderness.  Neurological: She is alert and oriented to person, place, and time.  Skin: Skin is warm and dry.  She is not diaphoretic.  Psychiatric: She has a normal mood and affect. Her behavior is normal.  Nursing note and vitals reviewed.    ED Treatments / Results  Labs (all labs ordered are listed, but only abnormal results are displayed) Labs Reviewed  CBC WITH DIFFERENTIAL/PLATELET - Abnormal; Notable for the following components:      Result Value   Hemoglobin 11.3 (*)    HCT 35.5 (*)    MCV 76.3 (*)    MCH 24.3 (*)    RDW 18.6 (*)    All other components within normal limits  COMPREHENSIVE METABOLIC PANEL - Abnormal; Notable for the following components:   Calcium 8.7 (*)    Total Protein 5.6 (*)    Albumin 2.9 (*)    All other components within normal limits  LIPASE, BLOOD    EKG None  Radiology Ct Renal Stone Study  Result Date: 04/06/2018 CLINICAL DATA:  42 y/o  F; 2 weeks of upper abdominal pain. EXAM: CT ABDOMEN AND PELVIS WITHOUT CONTRAST TECHNIQUE: Multidetector CT imaging of the abdomen and pelvis was performed following the standard protocol without IV contrast. COMPARISON:  04/02/2018 CT abdomen and pelvis. FINDINGS: Lower chest: No acute abnormality. Hepatobiliary: No focal liver abnormality is seen. No gallstones, gallbladder wall thickening, or biliary dilatation. Pancreas: Unremarkable. No pancreatic ductal dilatation or surrounding inflammatory changes. Spleen: Normal in size without focal abnormality. Adrenals/Urinary Tract: Adrenal glands are unremarkable. Kidneys are normal, without renal calculi, focal lesion, or hydronephrosis. Bladder is partially collapsed, poor visualization of the 7 mm nodule within the right posterior bladder as seen on prior CT. Stomach/Bowel: Gastric bypass postsurgical changes. Ingested material within the gastric pouch is decreased when compared with the prior study. Appendix appears normal. No evidence of bowel wall thickening, distention, or inflammatory changes. Vascular/Lymphatic: Aortic atherosclerosis. No enlarged abdominal or  pelvic lymph nodes. Reproductive: Uterus and bilateral adnexa are unremarkable. Bilateral tubal ligation. Other: Chronic postsurgical changes within the anterior abdominal wall in small paraumbilical hernia containing fat are stable. Musculoskeletal: No fracture is seen. Age advanced spondylosis of the lumbar spine from L3 through S1 with loss of intervertebral disc space height, vacuum phenomenon of the intervertebral discs, endplate sclerosis, and facet arthropathy. L4-S1 laminectomy postsurgical changes. IMPRESSION: 1. Gastric bypass postsurgical changes. Ingested material within the gastric pouch is decreased when compared with the prior  study, however, findings may represent slow transit/partial obstruction from the gastric pouch to the small bowel as described on the prior study. 2.  Aortic Atherosclerosis (ICD10-I70.0). 3. 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 4. Bladder is partially collapsed, previously described bladder wall nodule not visualized on the current study. Consider direct visualization to exclude urothelial neoplasm. Electronically Signed   By: Kristine Garbe M.D.   On: 04/06/2018 20:38    Procedures Procedures (including critical care time)  Medications Ordered in ED Medications  morphine 4 MG/ML injection 4 mg (4 mg Intramuscular Given 04/06/18 1944)  ondansetron (ZOFRAN-ODT) disintegrating tablet 4 mg (4 mg Oral Given 04/06/18 1937)     Initial Impression / Assessment and Plan / ED Course  I have reviewed the triage vital signs and the nursing notes.  Pertinent labs & imaging results that were available during my care of the patient were reviewed by me and considered in my medical decision making (see chart for details).     42 yo F with a chief complaint of diffuse abdominal pain.  Going on for the past couple weeks.  Seen in the ED about a week ago with a CT scan that was negative for acute intra-abdominal pathology.  Due to her severe pain will  repeat exam obtain labs.   CT scan done today again shows that the patient may have a possible ileus though this seems unlikely as the patient does not had any vomiting.  She claims to have had continued bowel movements.  There is a increased stool burden especially worse in the right lower quadrant on my view of the images.  I discussed again trying a cleanout at home.  The patient became upset stating that something is wrong with her and she felt that something need to be done to fix it, she then left the ED prior to receiving her paperwork.  10:54 PM:  I have discussed the diagnosis/risks/treatment options with the patient and believe the pt to be eligible for discharge home to follow-up with PCP. We also discussed returning to the ED immediately if new or worsening sx occur. We discussed the sx which are most concerning (e.g., sudden worsening pain, fever, inability to tolerate by mouth) that necessitate immediate return. Medications administered to the patient during their visit and any new prescriptions provided to the patient are listed below.  Medications given during this visit Medications  morphine 4 MG/ML injection 4 mg (4 mg Intramuscular Given 04/06/18 1944)  ondansetron (ZOFRAN-ODT) disintegrating tablet 4 mg (4 mg Oral Given 04/06/18 1937)      The patient appears reasonably screen and/or stabilized for discharge and I doubt any other medical condition or other [facility] requiring further screening, evaluation, or treatment in the ED at this time prior to discharge.    Final Clinical Impressions(s) / ED Diagnoses   Final diagnoses:  Generalized abdominal pain    ED Discharge Orders    None       Deno Etienne, DO 04/06/18 2254

## 2018-04-06 NOTE — ED Notes (Signed)
Patient transported to CT 

## 2018-04-06 NOTE — ED Notes (Signed)
ED Provider at bedside. 

## 2018-04-21 IMAGING — CR DG HAND COMPLETE 3+V*R*
3 series · 3 of 3 positions shown · non-contrast
Comparison: 02/26/2014 right hand radiograph.

CLINICAL DATA: 41 y/o F; wound between thumb and index finger. Rule
out osteomyelitis.

EXAM:
RIGHT HAND - COMPLETE 3+ VIEW

[x hand pa right]
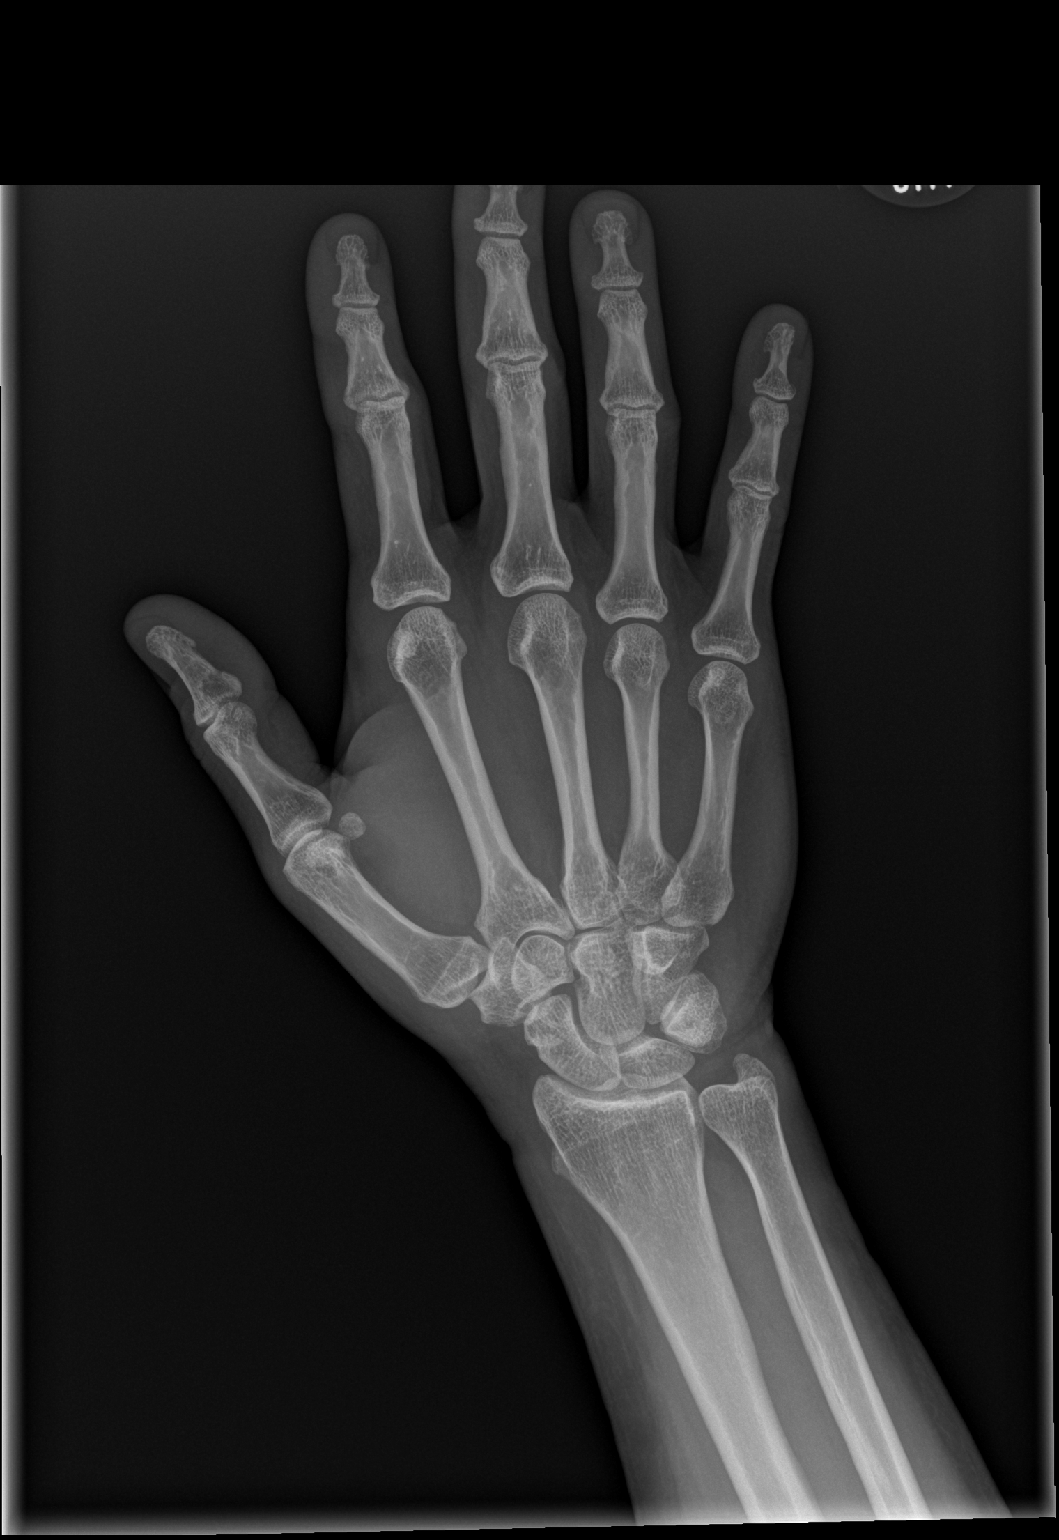

[x hand obl right]
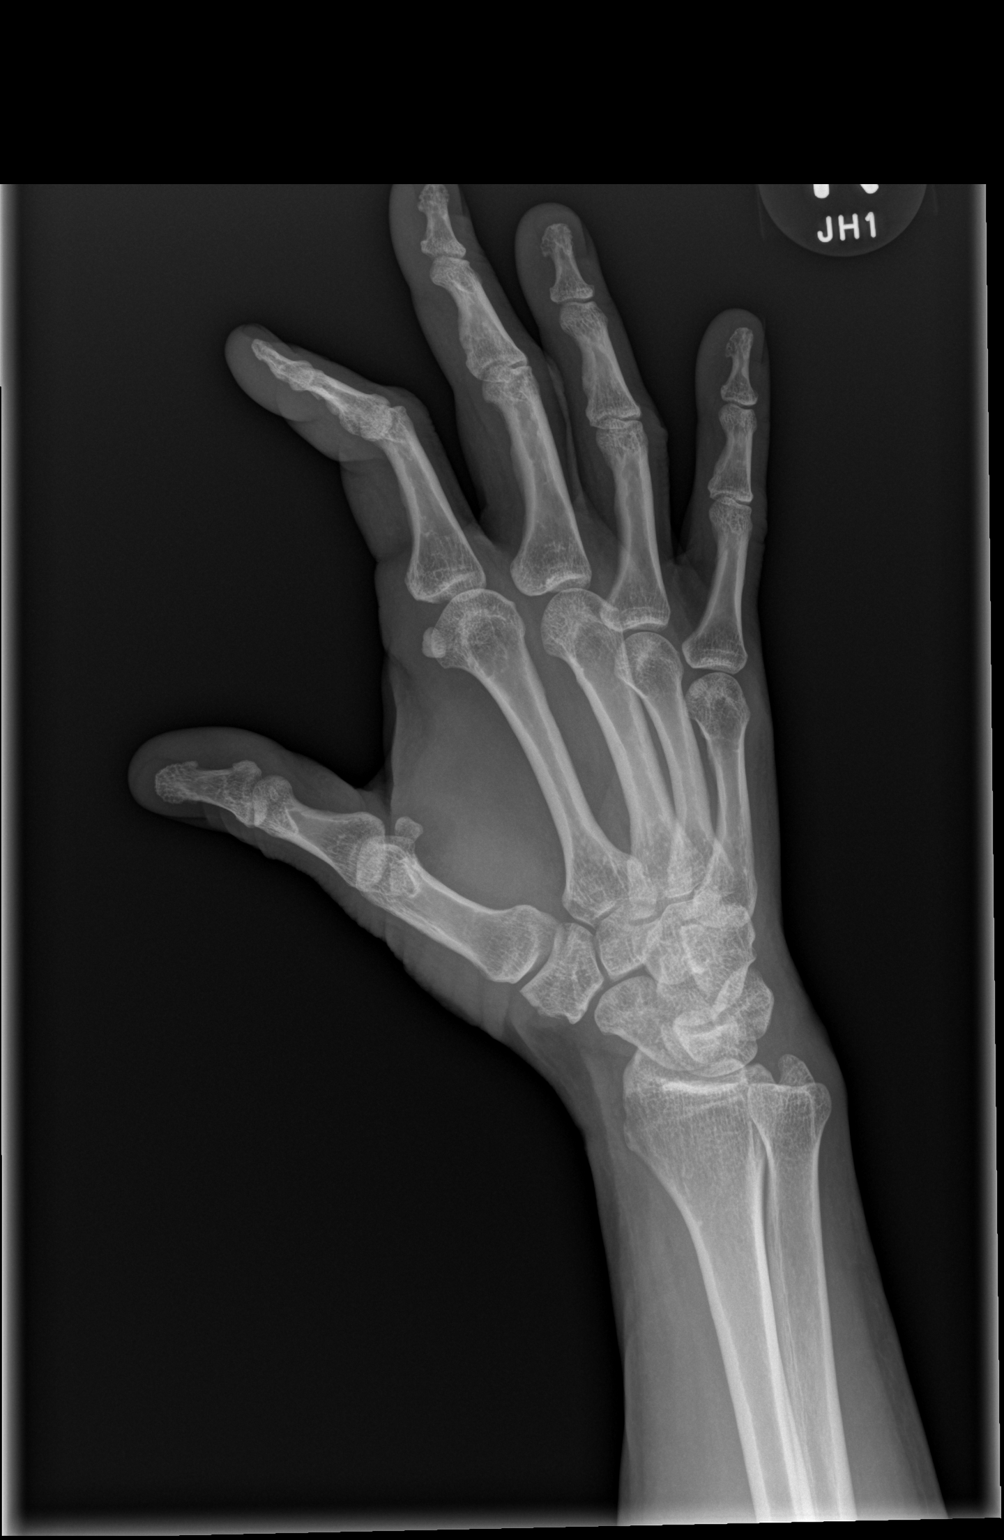

[x hand lat right]
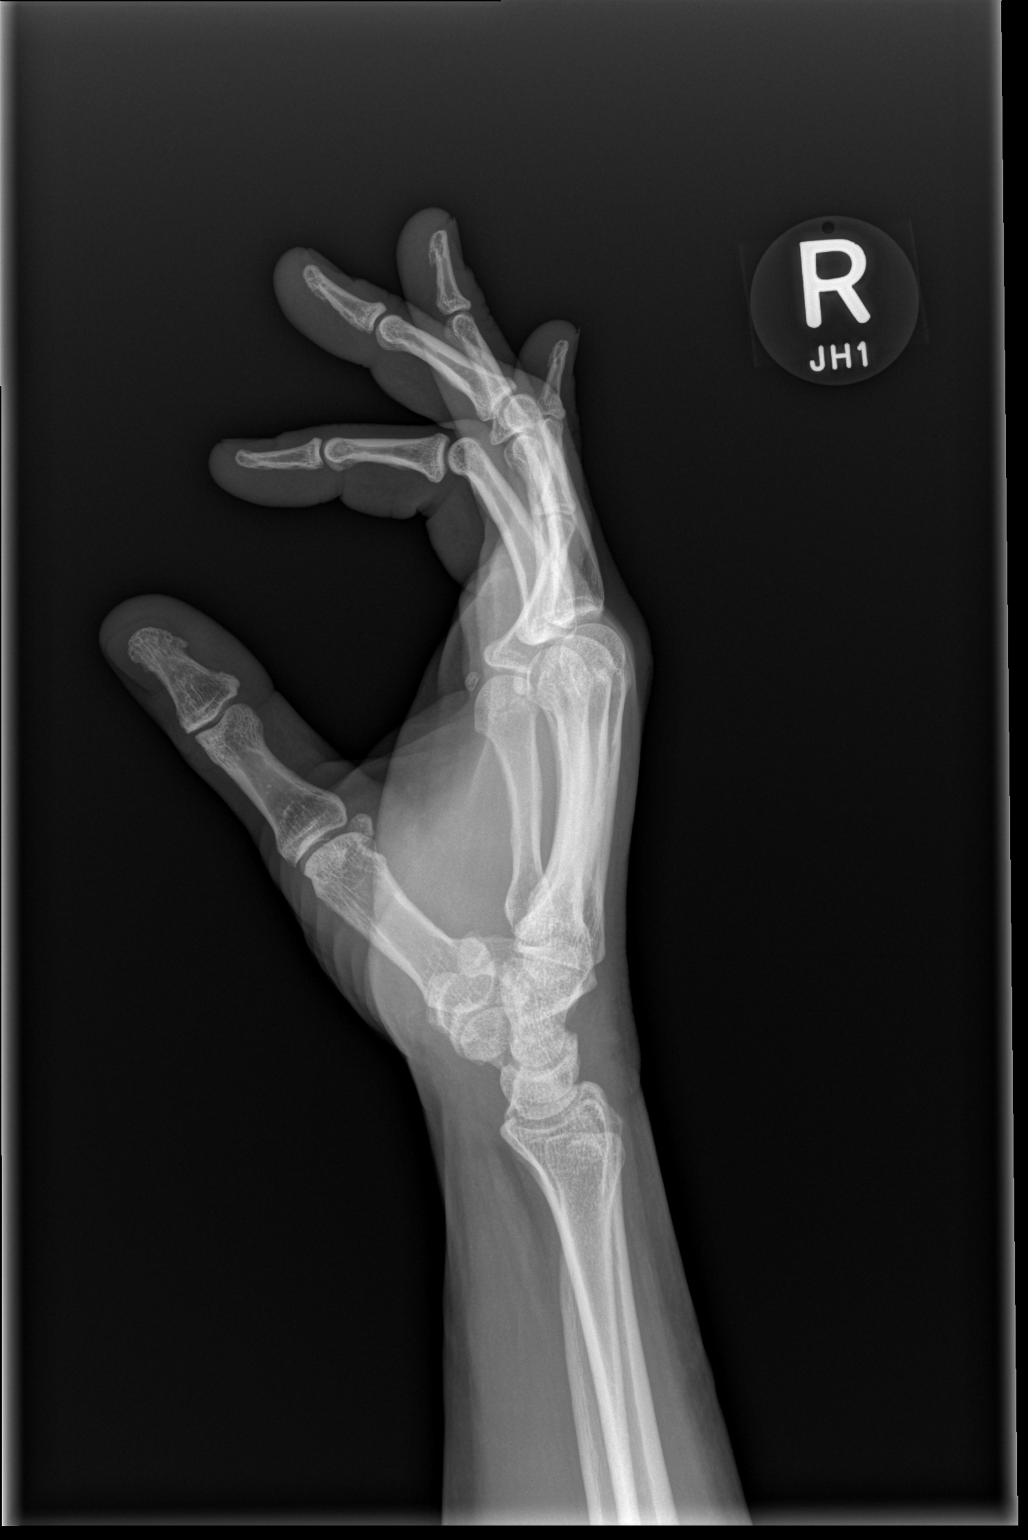

[3 of 3 positions shown; findings below may reference images not displayed]

FINDINGS: There is no evidence of fracture or dislocation. Stable lucent focus
within the first distal phalanx likely representing an enchondroma.
No lytic or destructive process of the bones.
IMPRESSION: No findings of osteomyelitis. Stable lucent focus within first
distal phalanx, likely small enchondroma.

By: Masou Maou M.D.

## 2018-04-26 IMAGING — CT CT ABD-PELV W/ CM
2 of 5 series · 16 of 46 positions shown, 18 images · IV contrast (ISOVUE)
Comparison: December 28, 2013

CLINICAL DATA: Apnea.  History of IV drug abuse.  Anemia.

EXAM:
CT ABDOMEN AND PELVIS WITH CONTRAST
TECHNIQUE: Multidetector CT imaging of the abdomen and pelvis was performed
using the standard protocol following bolus administration of
intravenous contrast.
CONTRAST:  100mL LKORUD-B77 IOPAMIDOL (LKORUD-B77) INJECTION 61%

[Series 2: axial st · axial · 0.90mm/px · z∈[+1000,+1435]mm · 13 of 101 slices shown, 15 images]
[im 7/101  soft-tissue]
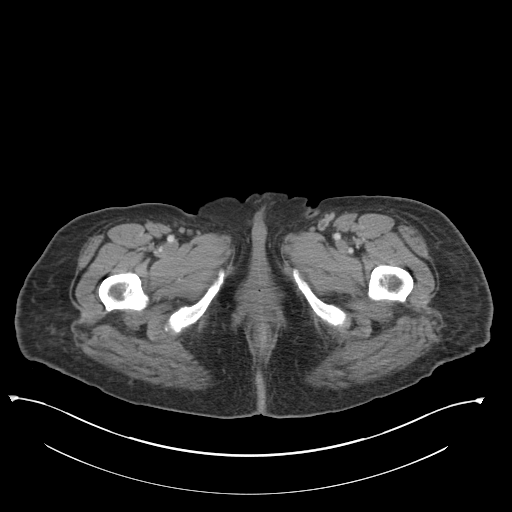
[im 7/101  bone]
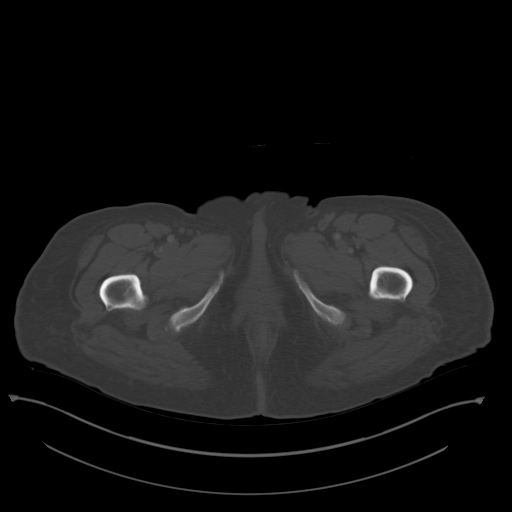
[im 14/101  soft-tissue]
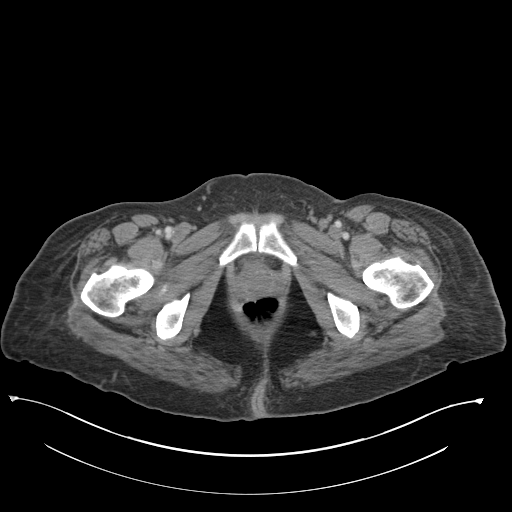
[im 21/101  soft-tissue]
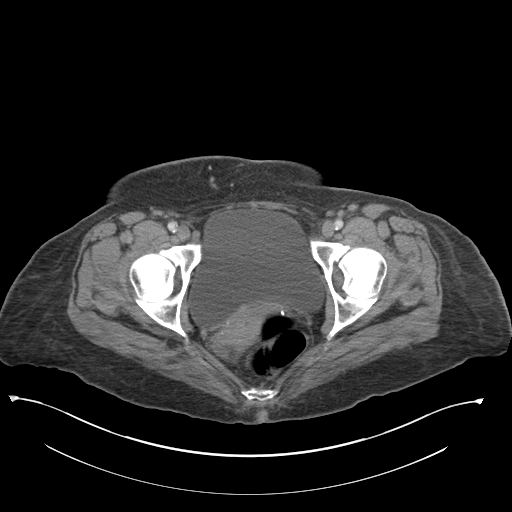
[im 27/101  soft-tissue]
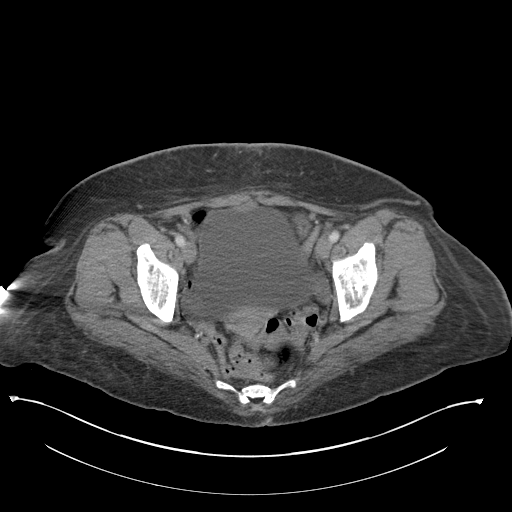
[im 34/101  soft-tissue]
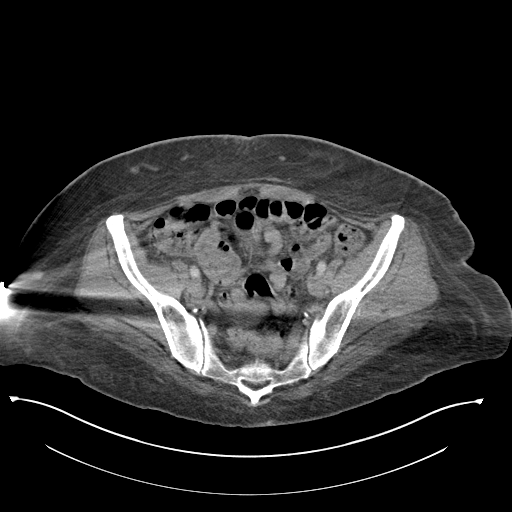
[im 41/101  soft-tissue]
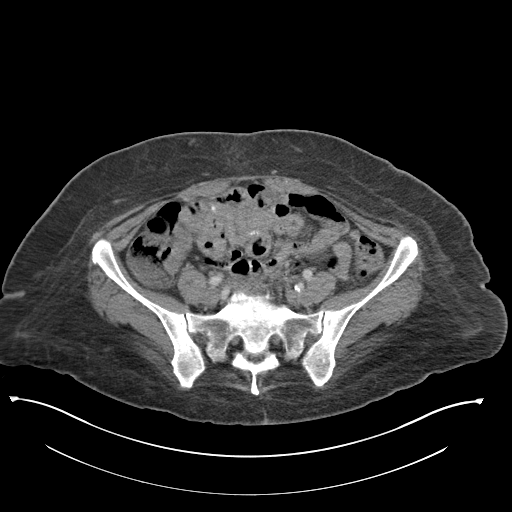
[im 54/101  soft-tissue]
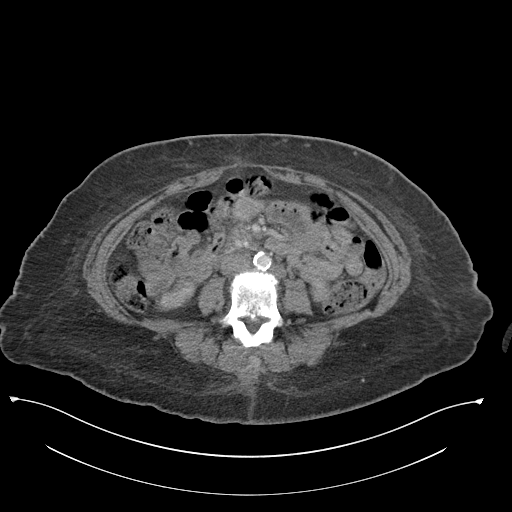
[im 61/101  soft-tissue]
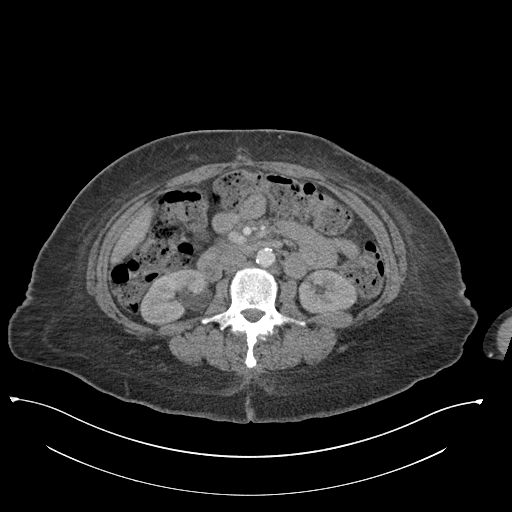
[im 67/101  soft-tissue]
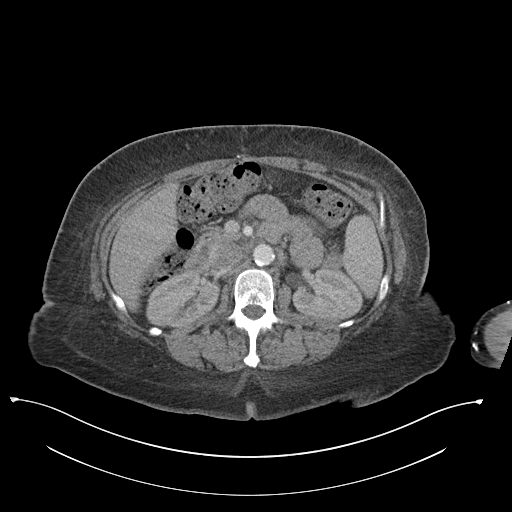
[im 67/101  bone]
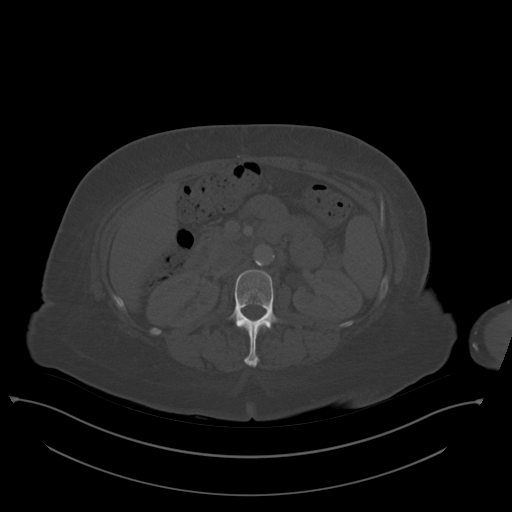
[im 74/101  soft-tissue]
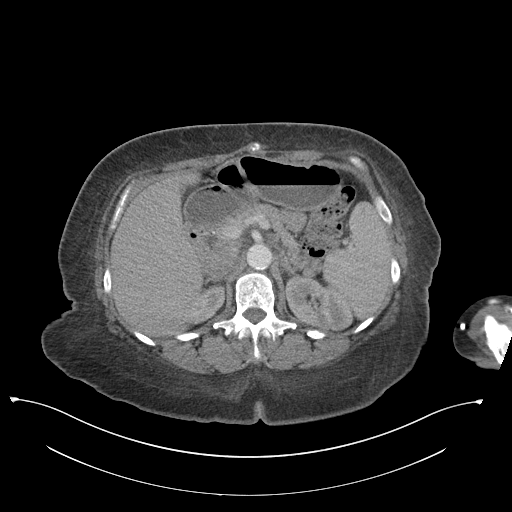
[im 81/101  soft-tissue]
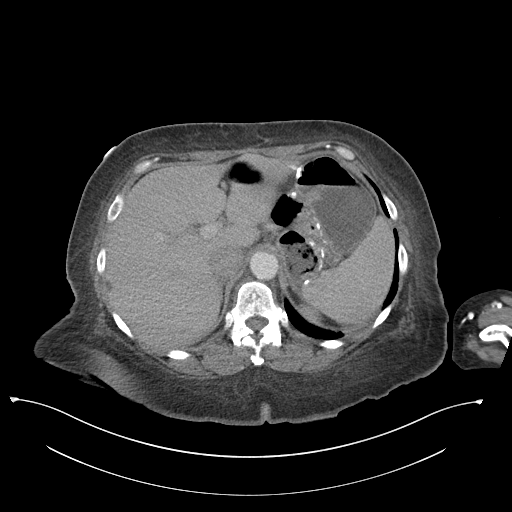
[im 87/101  soft-tissue]
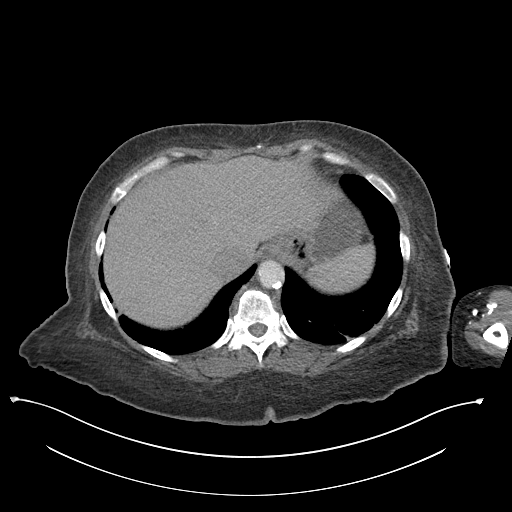
[im 94/101  soft-tissue]
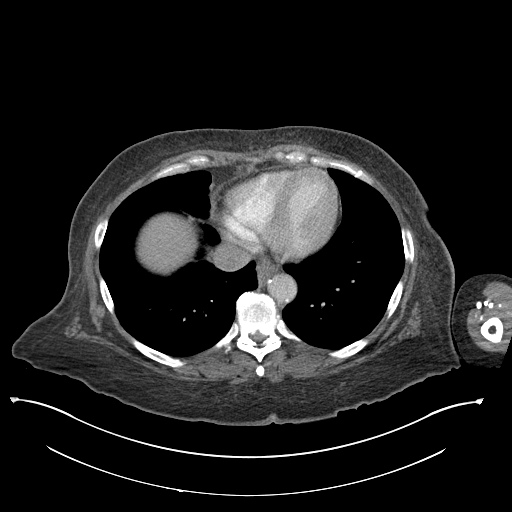

[Series 5: coronal st · coronal · 0.86mm/px · 3 of 97 slices shown]
[im 33/97  soft-tissue]
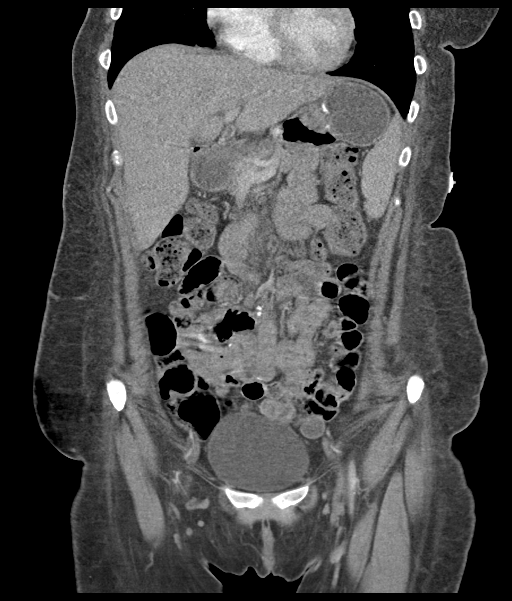
[im 43/97  soft-tissue]
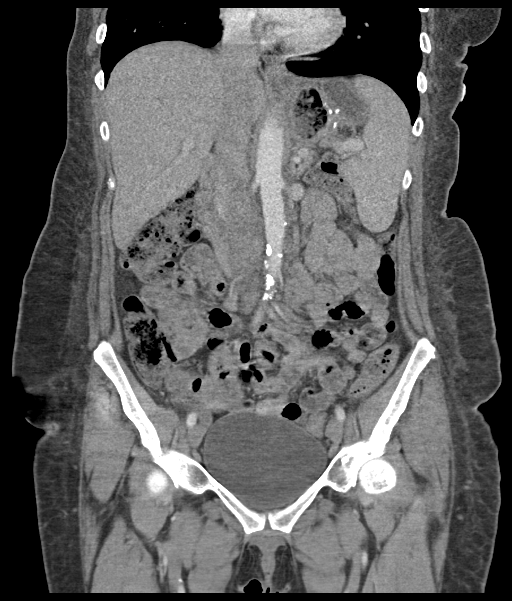
[im 54/97  soft-tissue]
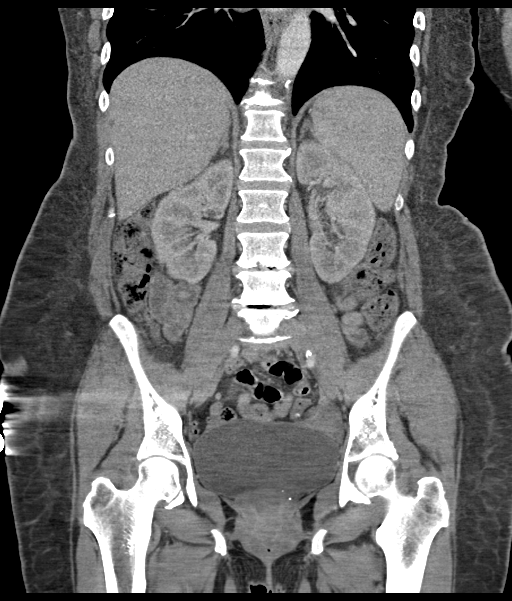

[16 of 46 positions shown; findings below may reference images not displayed]

FINDINGS: Lower chest: Bibasilar atelectasis. No other abnormalities in the
lung bases.

Hepatobiliary: A small cyst is seen in the lateral right hepatic
lobe, unchanged since 4638. No suspicious liver masses. Previous
cholecystectomy. The portal vein is patent. Mild periportal edema,
nonspecific.

Pancreas: Unremarkable. No pancreatic ductal dilatation or
surrounding inflammatory changes.

Spleen: Normal in size without focal abnormality.

Adrenals/Urinary Tract: The adrenal glands and kidneys are normal.
No ureteral stones. There is a small dense nodule projected over the
posterior bladder to the right best seen on sagittal image 74 and
axial image 80 measuring up to 7 mm. The bladder is otherwise
normal.

Stomach/Bowel: Patient is status post gastric bypass. The stomach is
otherwise normal. The duodenum and small bowel are unremarkable.
Mild fecal loading in the colon. The colon is otherwise normal. The
appendix is normal.

Vascular/Lymphatic: Atherosclerotic changes are seen in the
abdominal aorta, advanced for age. No aneurysm or dissection. No
adenopathy. Shotty nodes in the retroperitoneum may be reactive.

Reproductive: Uterus and bilateral adnexa are unremarkable.

Other: The subcutaneous fat appears edematous. No free air or free
fluid.

Musculoskeletal: Degenerative changes in the spine.
IMPRESSION: 1. There is a small dense nodule projected over the posterior
bladder. This is favored to be within the bladder, suspicious for a
small mass/neoplasm. Recommend cystoscopy.
2. Subcutaneous edema.
3. Atherosclerotic changes in the abdominal aorta, and veins for
age.
4. Shotty nodes in the retroperitoneum are likely reactive.
5. Degenerative changes in the spine.

## 2018-05-06 IMAGING — CT CT MAXILLOFACIAL W/ CM
3 of 6 series · 14 of 47 positions shown, 17 images · IV contrast (omnipaque)
Comparison: Prior CT from 08/12/2017.

CLINICAL DATA: Initial evaluation for acute jaw pain with swelling.

EXAM:
CT MAXILLOFACIAL WITH CONTRAST
TECHNIQUE: Multidetector CT imaging of the maxillofacial structures was
performed with intravenous contrast. Multiplanar CT image
reconstructions were also generated.
CONTRAST:  75mL OMNIPAQUE IOHEXOL 300 MG/ML  SOLN

[Series 3: max soft · axial · 0.39mm/px · z∈[+1569,+1719]mm · 9 of 85 slices shown, 12 images]
[im 5/85  brain]
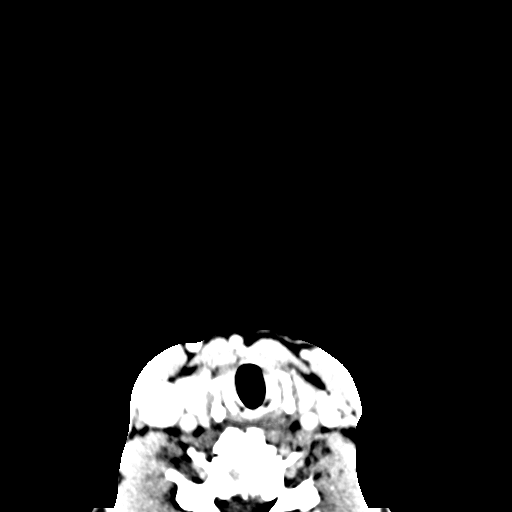
[im 5/85  bone]
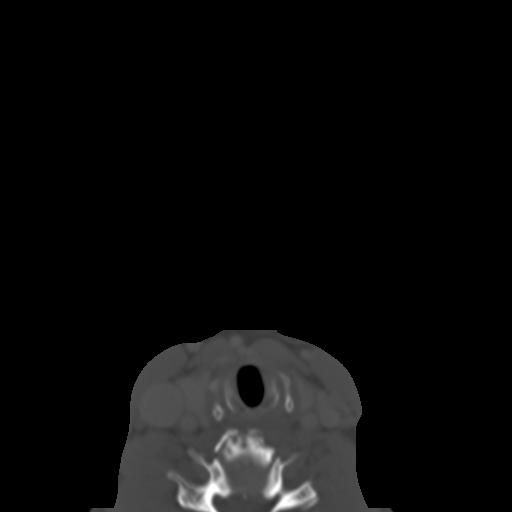
[im 15/85  bone]
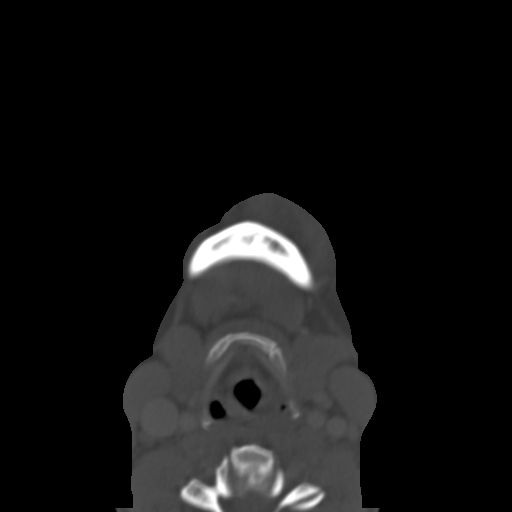
[im 25/85  bone]
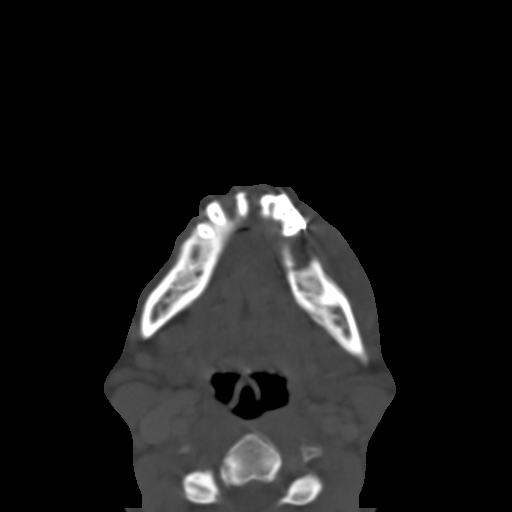
[im 35/85  bone]
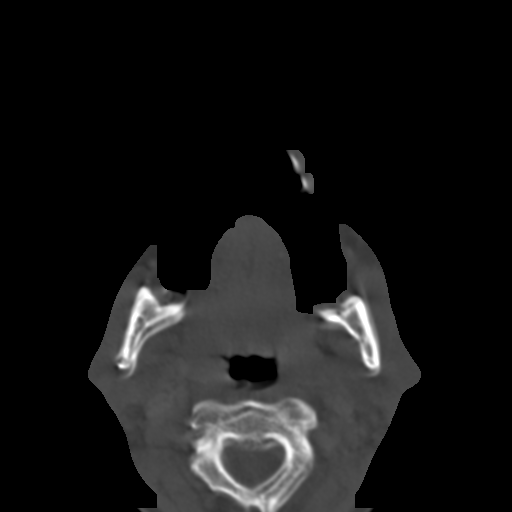
[im 45/85  brain]
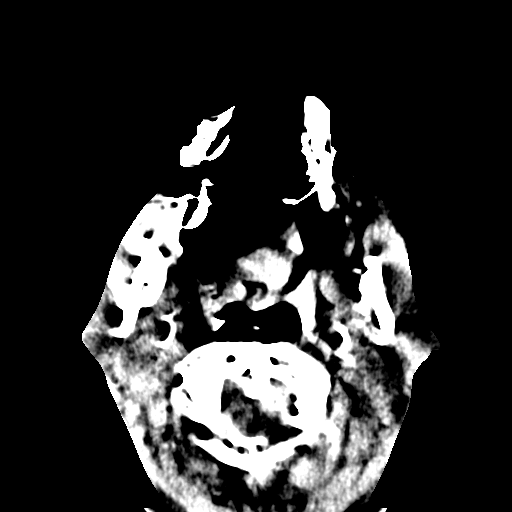
[im 45/85  bone]
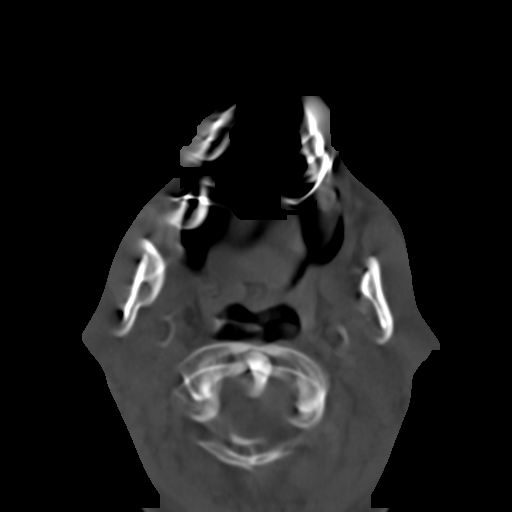
[im 50/85  bone]
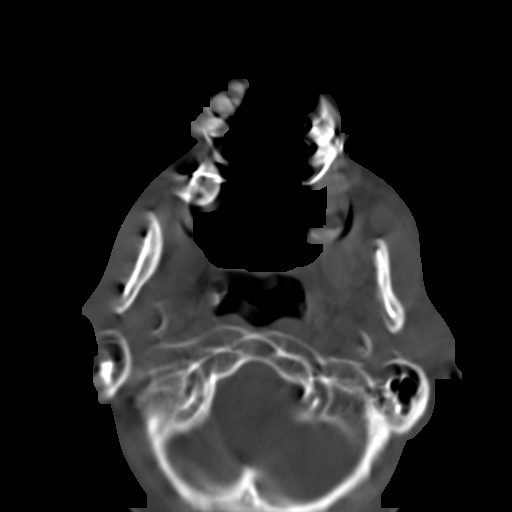
[im 60/85  bone]
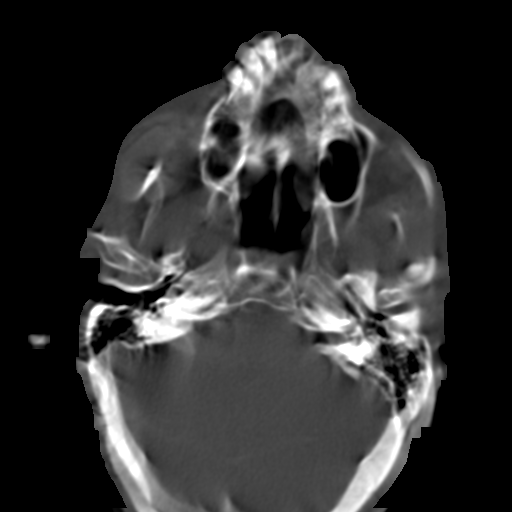
[im 70/85  bone]
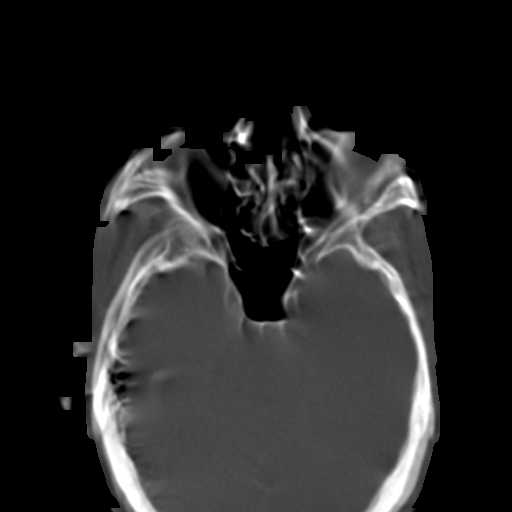
[im 80/85  brain]
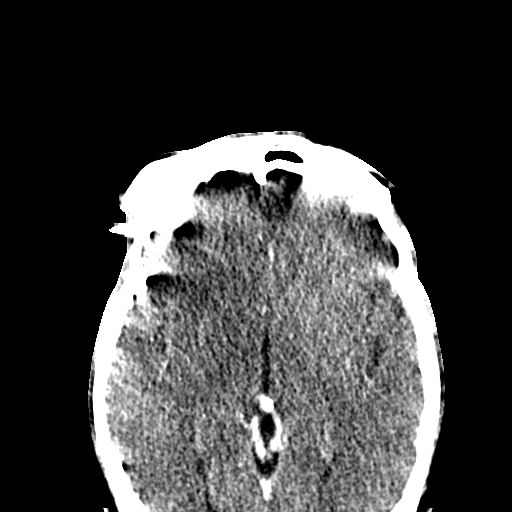
[im 80/85  bone]
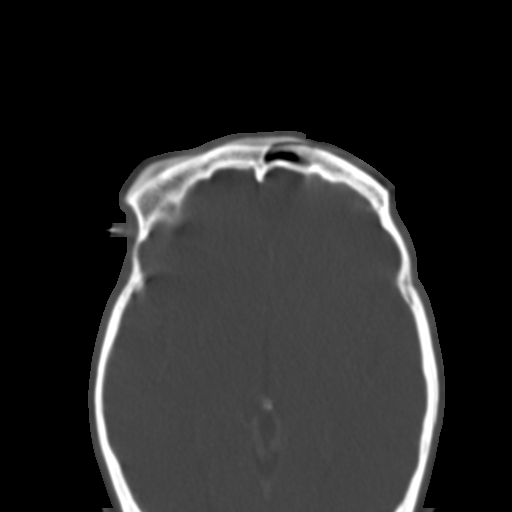

[Series 5: coronal soft · coronal · 0.32mm/px · 3 of 77 slices shown]
[im 20/77  bone]
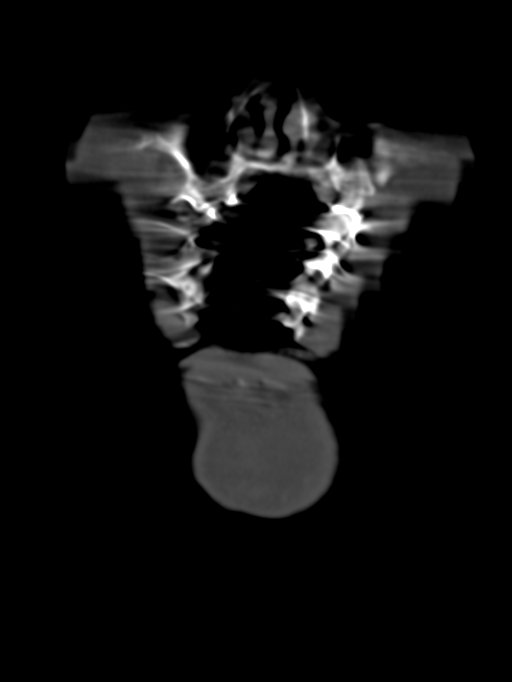
[im 39/77  bone]
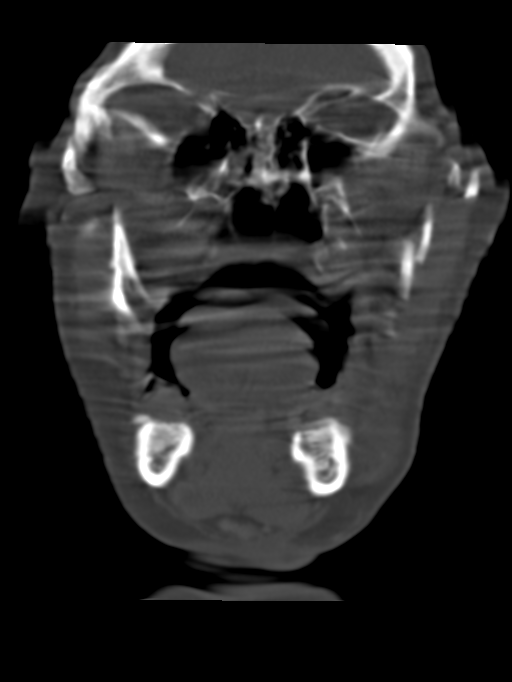
[im 58/77  bone]
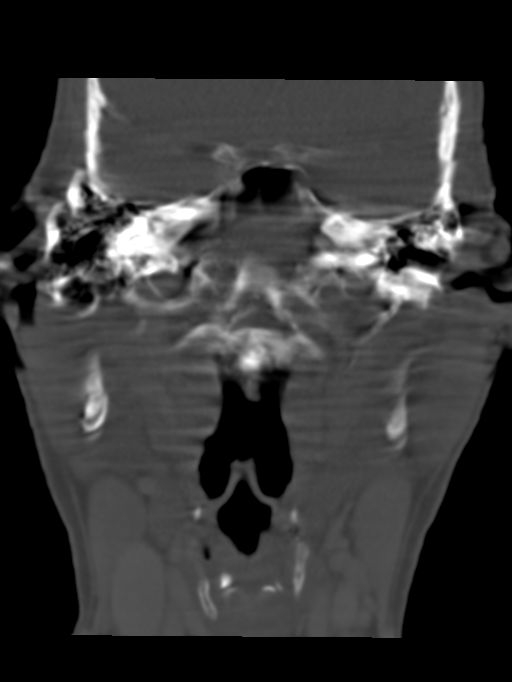

[Series 14: sagittal soft · sagittal · 0.31mm/px · 2 of 87 slices shown]
[im 29/87  bone]
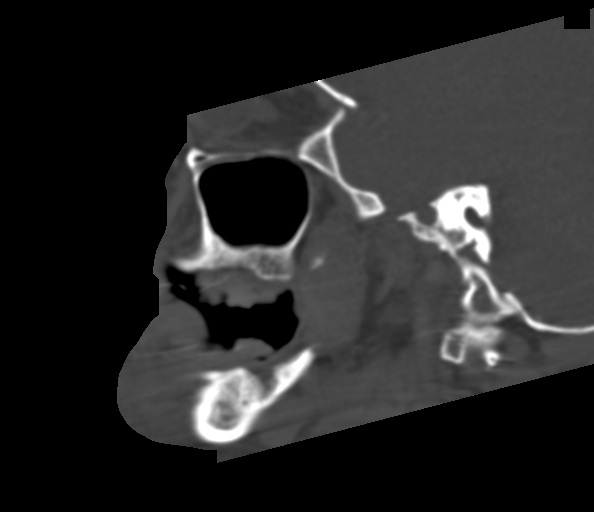
[im 58/87  bone]
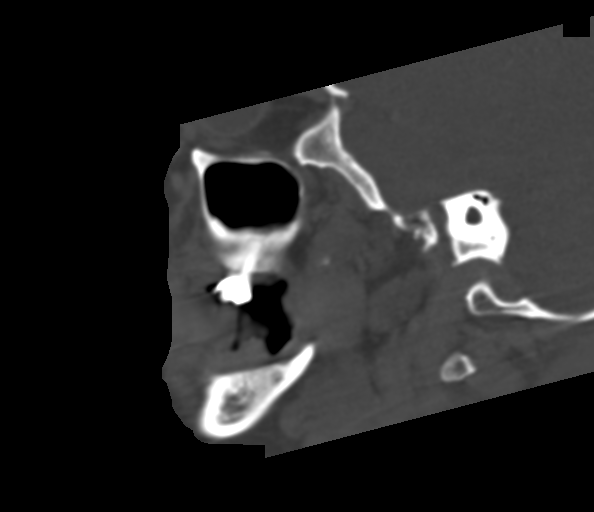

[14 of 47 positions shown; findings below may reference images not displayed]

FINDINGS: Osseous: Examination markedly degraded by motion artifact.

Multiple prior dental extractions noted, with scattered caries
within the remaining dentition. No osseous erosion to suggest
osteomyelitis.

Orbits: Visualized orbital soft tissues within normal limits.

Sinuses: Visualized sinuses are largely clear. Mastoid air cells and
middle ear cavities are clear.

Soft tissues: Soft tissue swelling with inflammatory stranding
present at the left chin, adjacent to the left mandibular body,
suspicious for possible cellulitis, although a component could
reflect residual postoperative changes from recent surgery.
Extension into the submental region without obvious extension into
the floor of mouth. Overlying skin thickening. No discrete or
drainable fluid collections identified. No extension of inflammatory
changes into the deeper spaces of the visualized face and neck at
this time. Mildly enlarged left level IB lymph nodes noted, likely
reactive.

Limited intracranial: Unremarkable.
IMPRESSION: 1. Somewhat limited exam due to motion artifact.
2. Asymmetric soft tissue swelling with edema and inflammatory
stranding at the left chin, suspicious for recurrent and/or residual
infection/cellulitis. No abscess or drainable fluid collection
identified. No extension into the floor of mouth or deeper spaces of
the face/neck at this time.

## 2018-05-07 IMAGING — CT CT NECK W/ CM
4 of 5 series · 16 of 33 positions shown, 18 images · IV contrast (iopamidol)
Comparison: 08/10/2017 CT neck

CLINICAL DATA: 41 y/o F; sore throat, stridor, epiglottitis or
tonsillitis suspected.

EXAM:
CT NECK WITH CONTRAST
TECHNIQUE: Multidetector CT imaging of the neck was performed using the
standard protocol following the bolus administration of intravenous
contrast.
CONTRAST:  75mL 2UWBDV-PFF IOPAMIDOL (2UWBDV-PFF) INJECTION 61%

[Series 2: axial neck · axial · 0.41mm/px · z∈[+1280,+1424]mm · 4 of 120 slices shown, 5 images]
[im 24/120  soft-tissue]
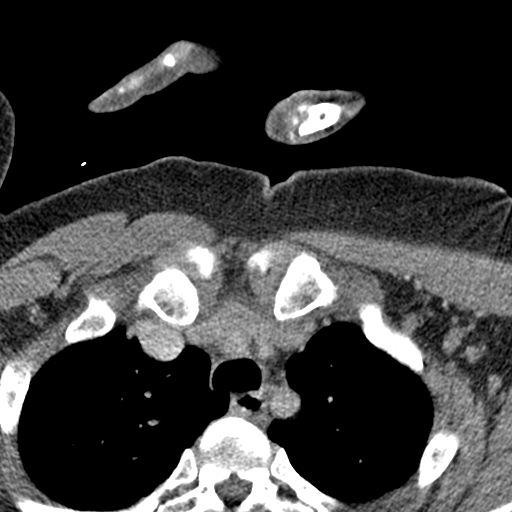
[im 24/120  bone]
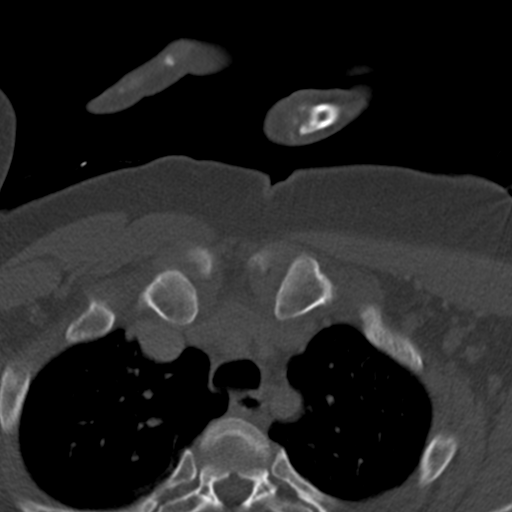
[im 48/120  bone]
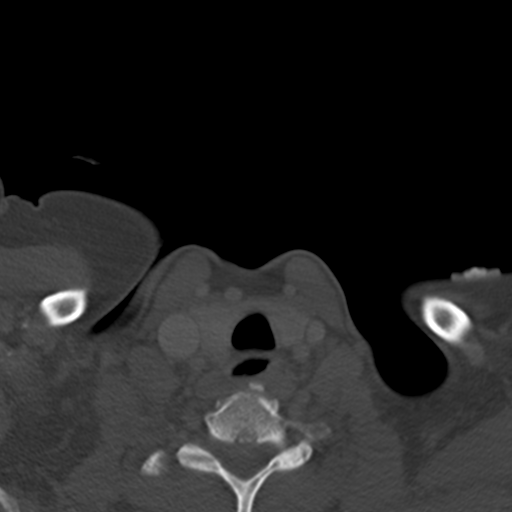
[im 72/120  bone]
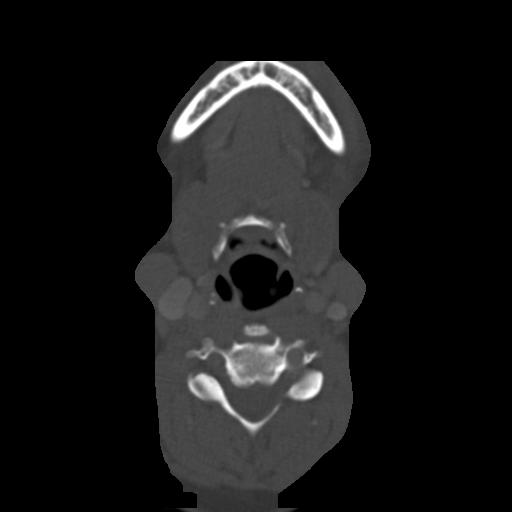
[im 96/120  bone]
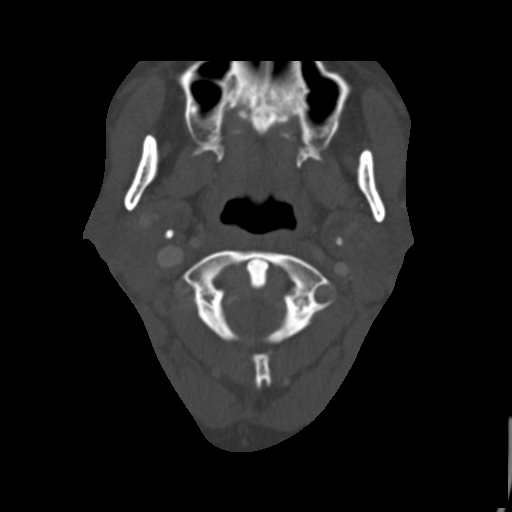

[Series 4: orthogonal ax · axial · 0.39mm/px · z∈[+1236,+1385]mm · 4 of 134 slices shown]
[im 27/134  bone]
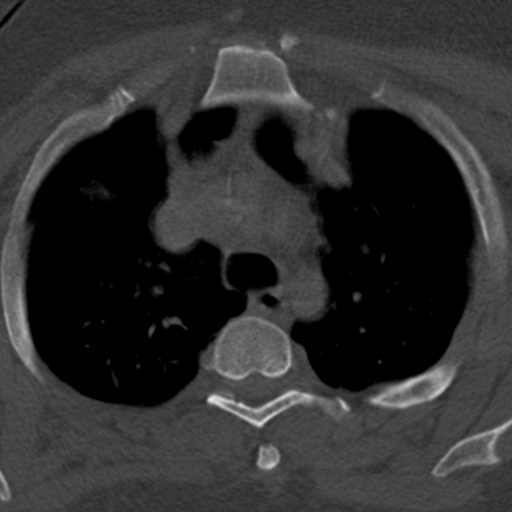
[im 54/134  bone]
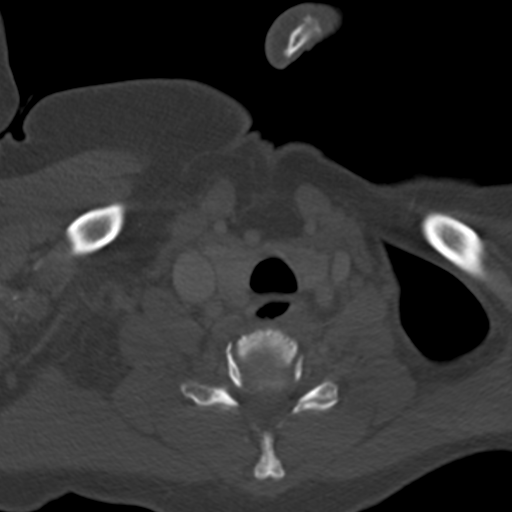
[im 80/134  bone]
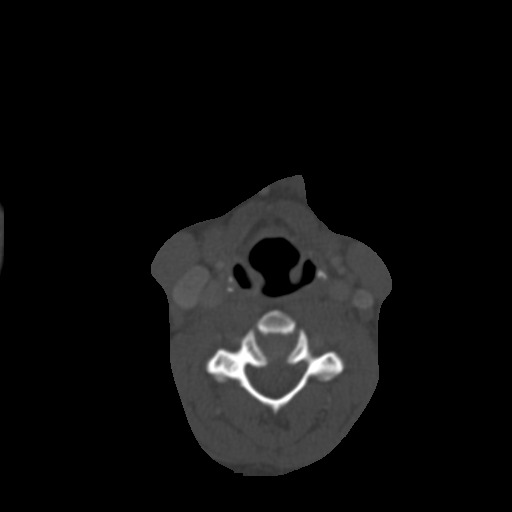
[im 107/134  bone]
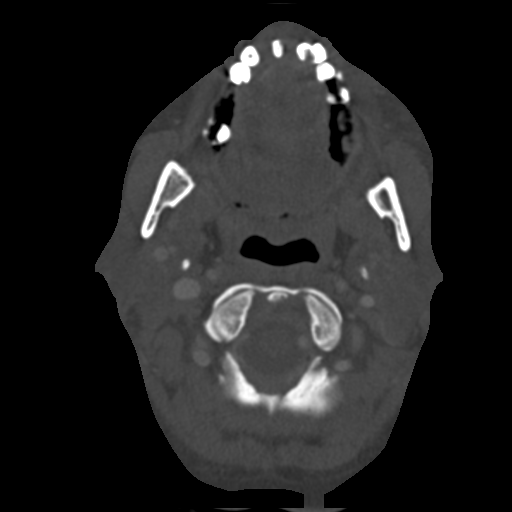

[Series 5: cor neck · coronal · 0.52mm/px · 3 of 107 slices shown]
[im 34/107  bone]
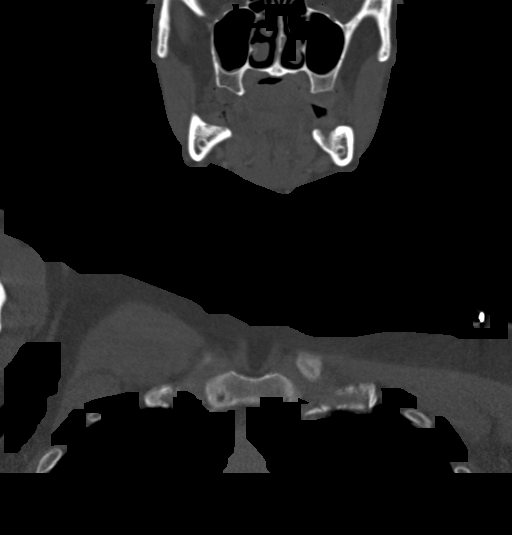
[im 47/107  bone]
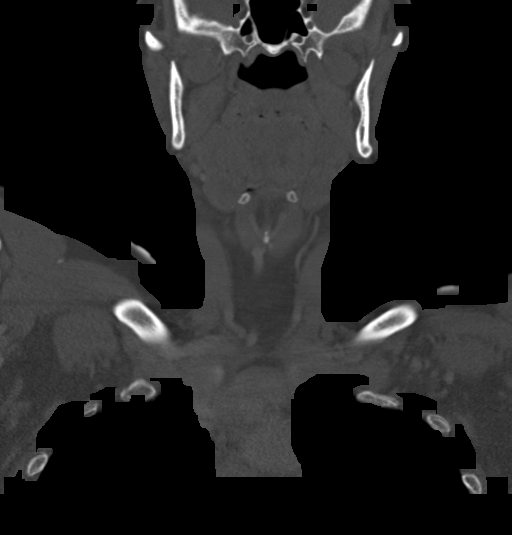
[im 60/107  bone]
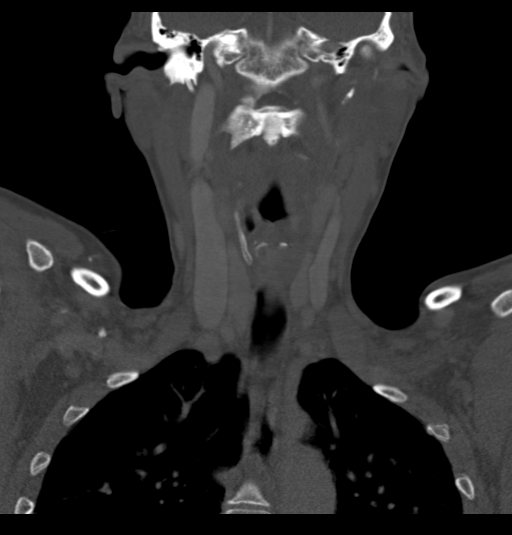

[Series 6: sag neck · sagittal · 0.48mm/px · 5 of 73 slices shown, 6 images]
[im 25/73  bone]
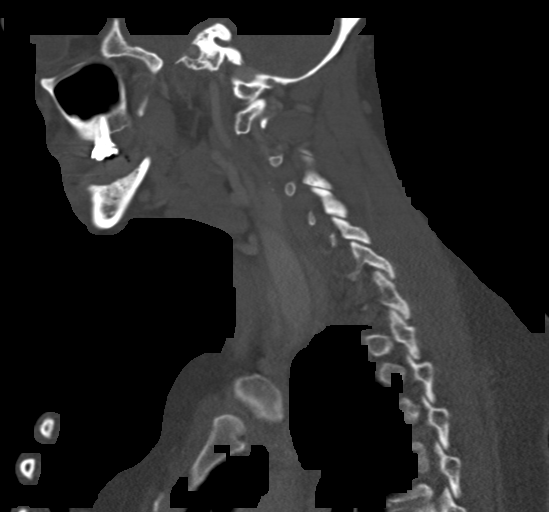
[im 31/73  bone]
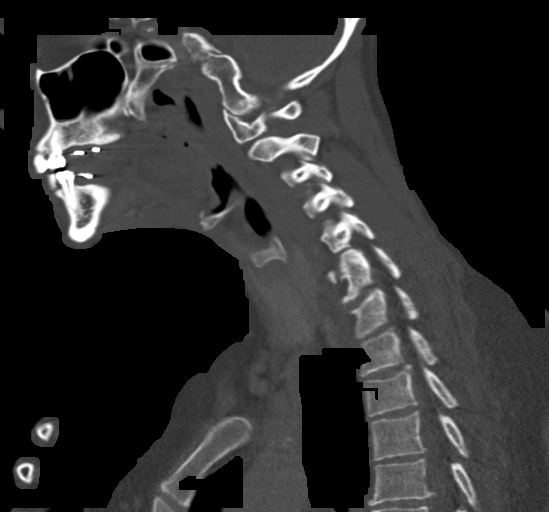
[im 37/73  soft-tissue]
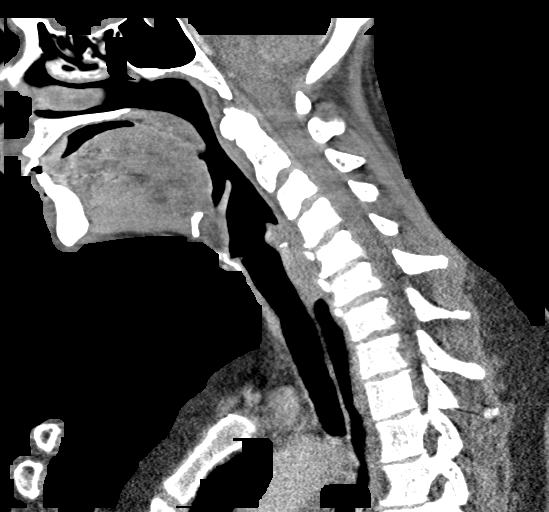
[im 37/73  bone]
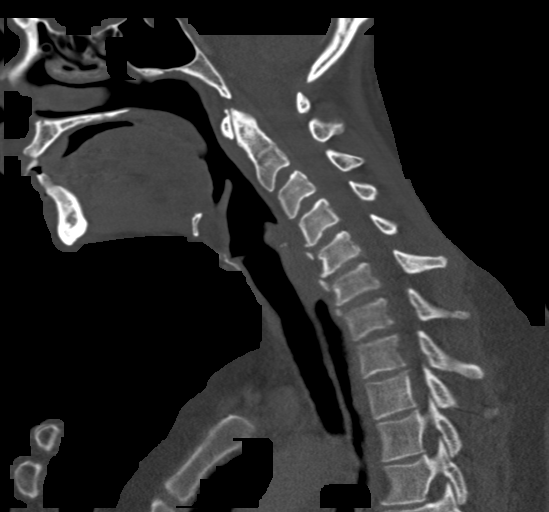
[im 43/73  bone]
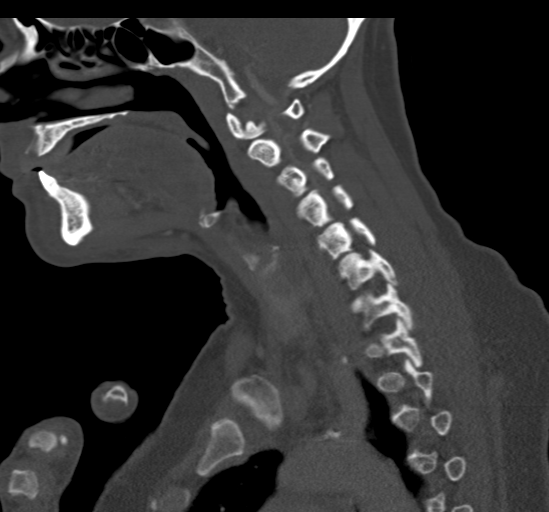
[im 49/73  bone]
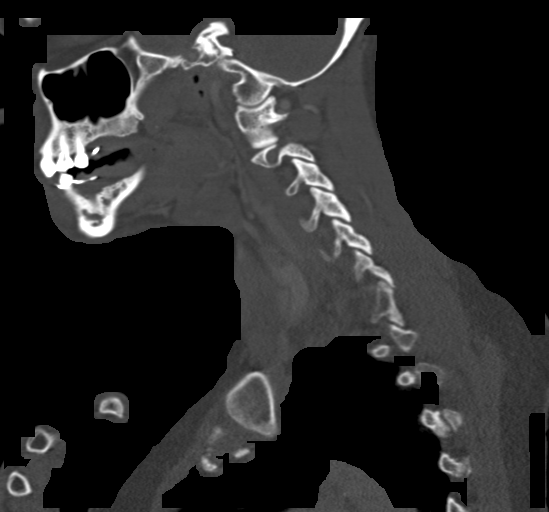

[16 of 33 positions shown; findings below may reference images not displayed]

FINDINGS: Pharynx and larynx: Normal. No mass or swelling.

Salivary glands: No inflammation, mass, or stone.

Thyroid: Normal.

Lymph nodes: Left-sided submandibular lymphadenopathy without lymph
node necrosis, likely reactive.

Vascular: Negative.

Limited intracranial: Negative.

Visualized orbits: Negative.

Mastoids and visualized paranasal sinuses: Clear.

Skeleton: Mild cervical spondylosis with multilevel disc and facet
degenerative changes. No high-grade bony canal stenosis. Severe
dental disease with multiple absent teeth and dental caries.

Upper chest: Negative.

Other: There is inflammation in the left submandibular compartment
and in the left facial soft tissue overlying the left mandible from
symphysis 2 angle.
IMPRESSION: 1. Inflammation in left submandibular compartment and in left facial
superficial soft tissues overlying left mandible from symphysis to
angle compatible with cellulitis. No rim enhancing abscess. Given
the location, infection is probably odontogenic, similar prior CT
neck.
2. Left submandibular lymphadenopathy, likely reactive. No lymph
node necrosis.

By: Delma Bode M.D.

## 2018-05-17 ENCOUNTER — Encounter (HOSPITAL_COMMUNITY): Payer: Self-pay | Admitting: Emergency Medicine

## 2018-05-17 ENCOUNTER — Emergency Department (HOSPITAL_COMMUNITY)
Admission: EM | Admit: 2018-05-17 | Discharge: 2018-05-17 | Disposition: A | Discharge status: Home / Self Care | Payer: Medicare Other | Attending: Emergency Medicine | Admitting: Emergency Medicine

## 2018-05-17 ENCOUNTER — Other Ambulatory Visit: Payer: Self-pay

## 2018-05-17 ENCOUNTER — Ambulatory Visit (HOSPITAL_COMMUNITY): Admission: RE | Admit: 2018-05-17 | Payer: Medicare Other | Source: Ambulatory Visit

## 2018-05-17 DIAGNOSIS — G8929 Other chronic pain: Secondary | ICD-10-CM | POA: Insufficient documentation

## 2018-05-17 DIAGNOSIS — F17210 Nicotine dependence, cigarettes, uncomplicated: Secondary | ICD-10-CM | POA: Diagnosis not present

## 2018-05-17 DIAGNOSIS — R1084 Generalized abdominal pain: Secondary | ICD-10-CM | POA: Diagnosis not present

## 2018-05-17 MED ORDER — DICYCLOMINE HCL 10 MG/ML IM SOLN
20.0000 mg | Freq: Once | INTRAMUSCULAR | Status: DC
  Filled 2018-05-17: qty 2

## 2018-05-17 MED ORDER — LORAZEPAM 2 MG/ML IJ SOLN
0.5000 mg | Freq: Once | INTRAMUSCULAR | Status: DC
  Filled 2018-05-17: qty 1

## 2018-05-17 MED ORDER — SODIUM CHLORIDE 0.9 % IV BOLUS: 500.0000 mL | Freq: Once | INTRAVENOUS | Status: DC

## 2018-05-17 NOTE — ED Provider Notes (Signed)
[facility] DEPT Provider Note   CSN: [id number] Arrival date & time: 05/17/18  2110     History   Chief Complaint Chief Complaint  Patient presents with  . Abdominal Pain    HPI Terri [name] is a 42 y.o. female.  HPI Patient presents with abdominal pain. Patient states the pain began about 8 hours prior to ED arrival Since onset pain is been focally in the right lateral superior abdomen, nonradiating, sharp, severe, worse with motion. There is associated nausea, and she has had one bowel movement since onset. But is unclear if she is taking any medication for pain relief. No reported fever. Patient has a history of multiple medical issues including bipolar disorder, episodes of abdominal pain prompting ED evaluation within the past 2 months.  Past Medical History:  Diagnosis Date  . Arthritis    Back and legs  . Bipolar disorder ([place])   . Chronic back pain   . Depression   . DVT (deep venous thrombosis) ([facility])    early 20's leg  . History of blood transfusion   . Hypertension   . Migraine headache   . Neuropathy   . Pneumonia   . 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 Sciatica   . Stress incontinence     Patient Active Problem List   Diagnosis Date Noted  . GIB (gastrointestinal bleeding) 09/13/2017  . Apnea 09/13/2017  . Essential hypertension   . Cellulitis and abscess of neck 08/12/2017  . Cellulitis of submandibular region 08/10/2017  . Odontogenic infection of jaw 08/10/2017  . Cholecystitis 02/14/2017  . Opioid abuse with opioid-induced mood disorder ([place]) 01/25/2017  . Anxiety 02/11/2014  . Depression 07/12/2012    Past Surgical History:  Procedure Laterality Date  . ALVEOLOPLASTY Bilateral 01/31/2018   Procedure: ALVEOLOPLASTY;  Surgeon: Diona Browner, DDS;  Location: [facility];  Service: Oral Surgery;  Laterality: Bilateral;  . ANKLE SURGERY Left 1992   with hardware  . BACK SURGERY    . CESAREAN SECTION    .  CHOLECYSTECTOMY N/A 02/15/2017   Procedure: LAPAROSCOPIC CHOLECYSTECTOMY;  Surgeon: Clovis Riley, MD;  Location: [facility];  Service: General;  Laterality: N/A;  . ESOPHAGOGASTRODUODENOSCOPY (EGD) WITH PROPOFOL N/A 09/15/2017   Procedure: ESOPHAGOGASTRODUODENOSCOPY (EGD) WITH PROPOFOL;  Surgeon: Clarene Essex, MD;  Location: [facility];  Service: Endoscopy;  Laterality: N/A;  . GASTRIC BYPASS    . INCISION AND DRAINAGE OF PERITONSILLAR ABCESS Left 08/13/2017   Procedure: INCISION AND DRAINAGE OF LEFT NECK ABSCESS;  Surgeon: Melida Quitter, MD;  Location: [facility];  Service: ENT;  Laterality: Left;  . LAPAROSCOPIC LYSIS OF ADHESIONS  02/15/2017   Procedure: LAPAROSCOPIC LYSIS OF ADHESIONS;  Surgeon: Clovis Riley, MD;  Location: [facility];  Service: General;;  . LUMBAR [place]    . LUMBAR FUSION    . TOOTH EXTRACTION Bilateral 01/31/2018   Procedure: DENTAL RESTORATION/EXTRACTIONS;  Surgeon: Diona Browner, DDS;  Location: [facility];  Service: Oral Surgery;  Laterality: Bilateral;  . TUBAL LIGATION    . UPPER GI ENDOSCOPY N/A 02/15/2017   Procedure: UPPER GI ENDOSCOPY;  Surgeon: Clovis Riley, MD;  Location: [facility];  Service: General;  Laterality: N/A;     OB History   No obstetric history on file.      Home Medications    Prior to Admission medications   Medication Sig Start Date End Date Taking? Authorizing Provider  ibuprofen (ADVIL,MOTRIN) 200 MG tablet  Take 200 mg by mouth every 6 (six) hours as needed for moderate pain.   Yes [provider]  lisinopril-hydrochlorothiazide (PRINZIDE,ZESTORETIC) 10-12.5 MG tablet Take 1 tablet by mouth daily. 01/22/17  Yes [provider]  acetaminophen (TYLENOL) 500 MG tablet Take 1 tablet (500 mg total) by mouth every 6 (six) hours as needed for moderate pain. Patient not taking: Reported on 05/17/2018 01/31/18   Diona Browner, DDS  albuterol (PROVENTIL HFA;VENTOLIN HFA) 108 (90 BASE) MCG/ACT inhaler Inhale 1-2 puffs into the lungs  every 4 (four) hours as needed for wheezing or shortness of breath. 03/07/15   Pisciotta, Elmyra Ricks, PA-C  amoxicillin (AMOXIL) 500 MG capsule Take 1 capsule (500 mg total) by mouth 3 (three) times daily. Patient not taking: Reported on 04/02/2018 01/31/18   Diona Browner, DDS  oxyCODONE-acetaminophen (PERCOCET) 5-325 MG tablet Take 1 tablet by mouth every 4 (four) hours as needed. Patient not taking: Reported on 04/02/2018 01/31/18   Diona Browner, DDS  pantoprazole (PROTONIX) 40 MG tablet Take 1 tablet (40 mg total) by mouth daily. 09/15/17 04/02/18  Arrien, Jimmy Picket, MD  polyethylene glycol [facility]) packet Take 17 g by mouth daily. Patient not taking: Reported on 05/17/2018 04/02/18   Hayden Rasmussen, MD  traMADol (ULTRAM) 50 MG tablet Take 1 tablet (50 mg total) by mouth every 6 (six) hours as needed. Patient not taking: Reported on 04/02/2018 10/11/17   Dorie Rank, MD    Family History Family History  Problem Relation Age of Onset  . Diabetes Mother   . Hypertension Mother   . Diabetes Father   . Hypertension Father     Social History Social History   Tobacco Use  . Smoking status: Current Every Day Smoker    Packs/day: 1.00    Years: 29.00    Pack years: 29.00    Types: Cigarettes  . Smokeless tobacco: Never Used  Substance Use Topics  . Alcohol use: No  . Drug use: Not Currently    Types: IV    Comment: Heroin- last time 05/2017     Allergies   Ketorolac tromethamine and Methocarbamol   Review of Systems Review of Systems  Constitutional:       Per HPI, otherwise negative  HENT:       Per HPI, otherwise negative  Respiratory:       Per HPI, otherwise negative  Cardiovascular:       Per HPI, otherwise negative  Gastrointestinal: Positive for abdominal pain and nausea. Negative for vomiting.  Endocrine:       Negative aside from HPI  Genitourinary:       Neg aside from HPI   Musculoskeletal:       Per HPI, otherwise negative  Skin: Negative.     Neurological: Negative for syncope.  Psychiatric/Behavioral: The patient is nervous/anxious.      Physical Exam Updated Vital Signs BP (!) 138/105 (BP Location: Left Arm)   Pulse (!) 111   Temp 98.5 F (36.9 C) (Oral)   Resp 18   SpO2 100%   Physical Exam Vitals signs and nursing note reviewed.  Constitutional:      Appearance: She is well-developed.     Comments: Uncomfortable appearing adult female awake and alert  HENT:     Head: Normocephalic and atraumatic.  Eyes:     Conjunctiva/sclera: Conjunctivae normal.  Cardiovascular:     Rate and Rhythm: Regular rhythm. Tachycardia present.  Pulmonary:     Effort: Pulmonary effort is normal. No respiratory  distress.     Breath sounds: Normal breath sounds. No stridor.  Abdominal:     General: There is no distension.     Palpations: Abdomen is soft.     Tenderness: There is abdominal tenderness.     Comments: Patient's abdomen is soft, non-peritoneal, though she does have some pain in the right lateral upper abdomen.   Skin:    General: Skin is warm and dry.  Neurological:     Mental Status: She is alert and oriented to person, place, and time.     Cranial Nerves: No cranial nerve deficit.  Psychiatric:        Mood and Affect: Mood is anxious.      ED Treatments / Results  Labs (all labs ordered are listed, but only abnormal results are displayed) Labs Reviewed  COMPREHENSIVE METABOLIC PANEL  CBC WITH DIFFERENTIAL/PLATELET  ETHANOL  LIPASE, BLOOD    EKG None  Radiology No results found.  Procedures Procedures (including critical care time)  Medications Ordered in ED Medications  sodium chloride 0.9 % bolus 500 mL (has no administration in time range)  dicyclomine (BENTYL) injection 20 mg (has no administration in time range)  LORazepam (ATIVAN) injection 0.5 mg (has no administration in time range)     Initial Impression / Assessment and Plan / ED Course  I have reviewed the triage vital signs and  the nursing notes.  Pertinent labs & imaging results that were available during my care of the patient were reviewed by me and considered in my medical decision making (see chart for details).    .Review after the initial evaluation notable for 4 prior ED visits in the past 6 months including several in the past few weeks due to abdominal pain. CT scan at that point showed possible ileus, likely stool retention.   10:47 PM Patient eloped, prior to receiving labs, meds, fluids, imaging. Patient was not stoppable, by staff, in spite of attempts.   Final Clinical Impressions(s) / ED Diagnoses  Abdominal pain   Carmin Muskrat, MD 05/17/18 2247

## 2018-05-17 NOTE — ED Notes (Signed)
Made aware by other staff that patient was yelling for pain medication and stated intention to leave while I was gathering medications and supplies to start an IV. Staff encouraged patient to stay to receive medication and care but patient refused.

## 2018-05-17 NOTE — ED Notes (Signed)
Pt refusing to stay any longer screaming "I am dying and none of y'all are doing anything about it." Pt encouraged to stay in order to receive care and medication. Pt refusing and walked out. No IV. Ambulatory and alert.

## 2018-05-17 NOTE — ED Notes (Signed)
Pt heard screaming from room "Nobody cares about me. I need pain medication." Pt and pt's guest have come out of the room multiple times approaching multiple staff members stating "I DEMAND to see the doctor. They just put me in this room and left me." RN and MD aware.

## 2018-05-17 NOTE — ED Triage Notes (Signed)
Pt c/o abd pain that radiates from right side abd to her back

## 2018-05-22 ENCOUNTER — Other Ambulatory Visit: Payer: Self-pay

## 2018-05-22 ENCOUNTER — Encounter (HOSPITAL_COMMUNITY): Payer: Self-pay

## 2018-05-22 ENCOUNTER — Emergency Department (HOSPITAL_COMMUNITY)
Admission: EM | Admit: 2018-05-22 | Discharge: 2018-05-22 | Discharge status: Against Medical Advice | Payer: Medicare Other | Attending: Emergency Medicine | Admitting: Emergency Medicine

## 2018-05-22 DIAGNOSIS — R1084 Generalized abdominal pain: Secondary | ICD-10-CM | POA: Insufficient documentation

## 2018-05-22 DIAGNOSIS — Z5321 Procedure and treatment not carried out due to patient leaving prior to being seen by health care provider: Secondary | ICD-10-CM | POA: Diagnosis not present

## 2018-05-22 MED ORDER — ONDANSETRON HCL 4 MG/2ML IJ SOLN
4.0000 mg | Freq: Once | INTRAMUSCULAR | Status: AC | PRN
  Administered 2018-05-22: 4 mg via INTRAVENOUS
  Filled 2018-05-22: qty 2

## 2018-05-22 NOTE — ED Provider Notes (Signed)
Patient LWBS   Margarita Mail, PA-C 05/22/18 2244    Quintella Reichert, MD 05/22/18 [phone]

## 2018-05-22 NOTE — ED Notes (Signed)
Walked into pts room and found pt removing own IV. Pt stated she can go suffer at home and wanted to leave. Risks were explained to the pt, pt acknowledged and still insisted on leaving.

## 2018-05-22 NOTE — ED Notes (Signed)
Bed: WA02 Expected date:  Expected time:  Means of arrival:  Comments: Ems hypotension

## 2018-05-22 NOTE — ED Triage Notes (Signed)
Per EMS,  Pt is presenting from home, called EMS due to syncopal episode episode. Pt stood up at home became dizzy, and lowered herself to the floor. Pt does not have memory of episode, but roommate states no LOC. Pt has had pain in lower right quadrant for past week. N/V/D starting today, no episodes of emesis with EMS.

## 2018-07-11 ENCOUNTER — Emergency Department (HOSPITAL_COMMUNITY)
Admission: EM | Admit: 2018-07-11 | Discharge: 2018-07-11 | Disposition: A | Discharge status: Home / Self Care | Payer: Medicare Other | Attending: Emergency Medicine | Admitting: Emergency Medicine

## 2018-07-11 ENCOUNTER — Encounter (HOSPITAL_COMMUNITY): Payer: Self-pay | Admitting: Emergency Medicine

## 2018-07-11 DIAGNOSIS — Z5321 Procedure and treatment not carried out due to patient leaving prior to being seen by health care provider: Secondary | ICD-10-CM | POA: Diagnosis not present

## 2018-07-11 DIAGNOSIS — R109 Unspecified abdominal pain: Secondary | ICD-10-CM | POA: Insufficient documentation

## 2018-07-11 DIAGNOSIS — K625 Hemorrhage of anus and rectum: Secondary | ICD-10-CM | POA: Diagnosis not present

## 2018-07-11 LAB — TYPE AND SCREEN
ABO/RH(D): B POS
Antibody Screen: NEGATIVE

## 2018-07-11 LAB — COMPREHENSIVE METABOLIC PANEL
ALT: 10 U/L (ref 0–44)
AST: 15 U/L (ref 15–41)
Albumin: 3.9 g/dL (ref 3.5–5.0)
Alkaline Phosphatase: 99 U/L (ref 38–126)
Anion gap: 6 (ref 5–15)
BUN: 13 mg/dL (ref 6–20)
CO2: 26 mmol/L (ref 22–32)
Calcium: 8.2 mg/dL — ABNORMAL LOW (ref 8.9–10.3)
Chloride: 105 mmol/L (ref 98–111)
Creatinine, Ser: 0.67 mg/dL (ref 0.44–1.00)
GFR calc Af Amer: >60 mL/min (ref >60)
GFR calc non Af Amer: >60 mL/min (ref >60)
Glucose, Bld: 81 mg/dL (ref 70–99)
Potassium: 3.6 mmol/L (ref 3.5–5.1)
Sodium: 137 mmol/L (ref 135–145)
Total Bilirubin: 0.5 mg/dL (ref 0.3–1.2)
Total Protein: 6.6 g/dL (ref 6.5–8.1)

## 2018-07-11 LAB — CBC
HCT: 32.6 % — ABNORMAL LOW (ref 36.0–46.0)
Hemoglobin: 10.7 g/dL — ABNORMAL LOW (ref 12.0–15.0)
MCH: 24 pg — ABNORMAL LOW (ref 26.0–34.0)
MCHC: 32.8 g/dL (ref 30.0–36.0)
MCV: 73.1 fL — ABNORMAL LOW (ref 80.0–100.0)
Platelets: 369 10*3/uL (ref 150–400)
RBC: 4.46 MIL/uL (ref 3.87–5.11)
RDW: 18.4 % — ABNORMAL HIGH (ref 11.5–15.5)
WBC: 11.8 10*3/uL — ABNORMAL HIGH (ref 4.0–10.5)
nRBC: 0 % (ref 0.0–0.2)

## 2018-07-11 LAB — I-STAT BETA HCG BLOOD, ED (MC, WL, AP ONLY): I-stat hCG, quantitative: <5 m[IU]/mL (ref <5)

## 2018-07-11 NOTE — ED Notes (Signed)
Patient called for room placement x3 with no answer. 

## 2018-07-11 NOTE — ED Notes (Signed)
Patient called for room placement x2 with no answer. 

## 2018-07-11 NOTE — ED Triage Notes (Signed)
Pt c/o abd pain with brigth red rectal bleeding since last night.

## 2018-11-13 IMAGING — CT CT ABD-PELV W/ CM
2 of 5 series · 16 of 46 positions shown, 18 images · IV contrast (ISOVUE)
Comparison: CT abdomen pelvis 09/13/2017

CLINICAL DATA: Upper abdominal pain for 2 weeks. Prior gastric
bypass.

EXAM:
CT ABDOMEN AND PELVIS WITH CONTRAST
TECHNIQUE: Multidetector CT imaging of the abdomen and pelvis was performed
using the standard protocol following bolus administration of
intravenous contrast.
CONTRAST:  100mL OMNIPAQUE IOHEXOL 300 MG/ML  SOLN

[Series 2: axial st · axial · 0.80mm/px · z∈[+1032,+1447]mm · 13 of 97 slices shown, 15 images]
[im 7/97  soft-tissue]
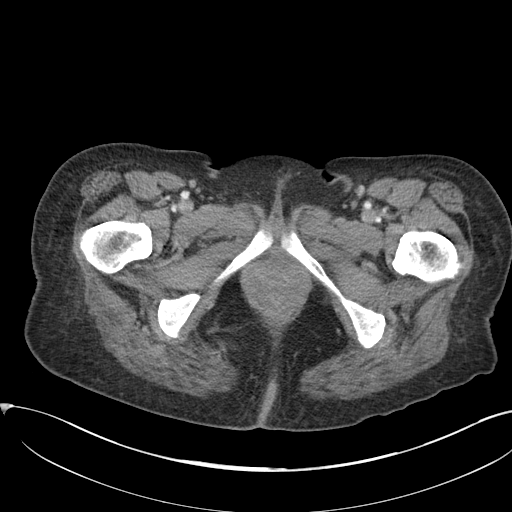
[im 7/97  bone]
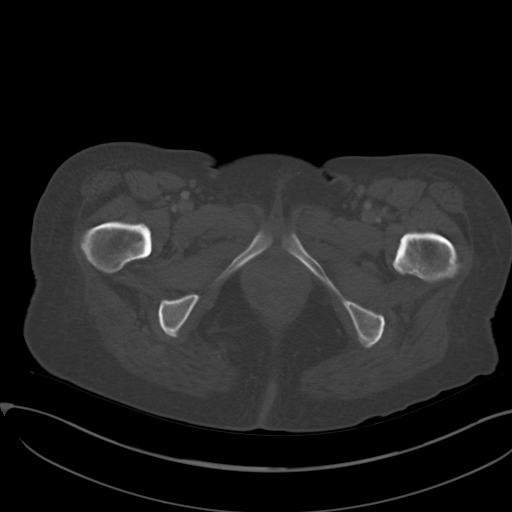
[im 14/97  soft-tissue]
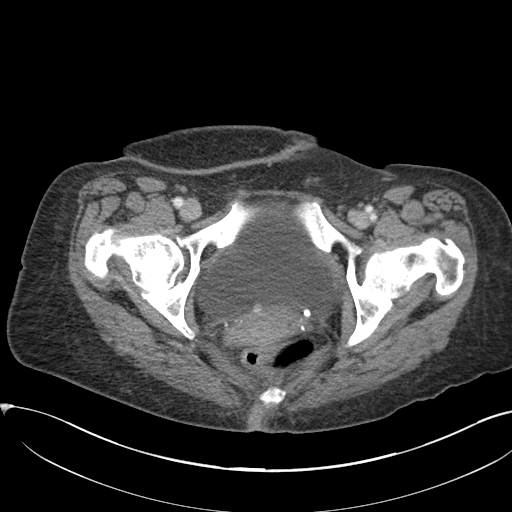
[im 21/97  soft-tissue]
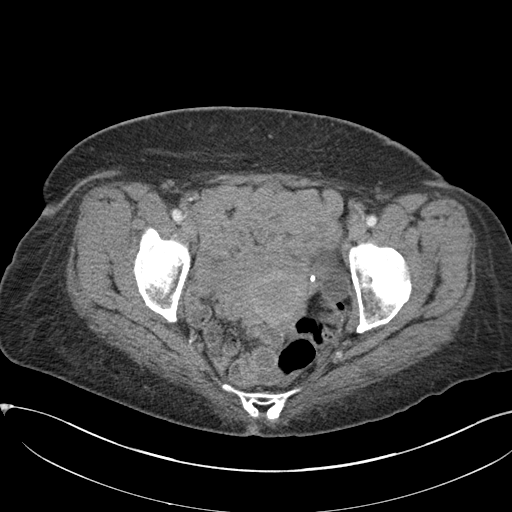
[im 28/97  soft-tissue]
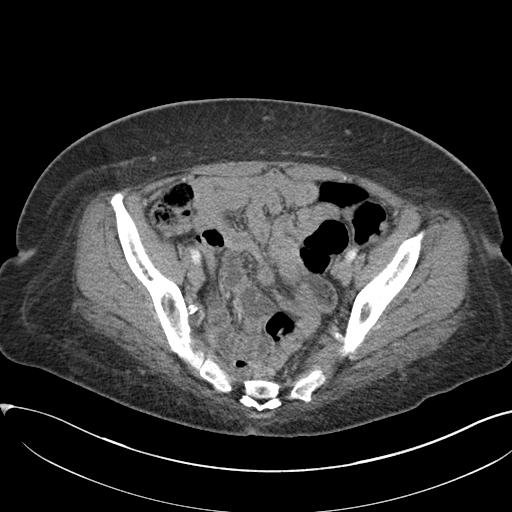
[im 35/97  soft-tissue]
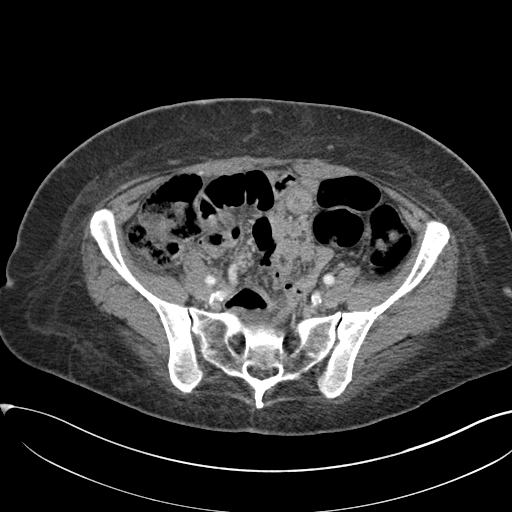
[im 42/97  soft-tissue]
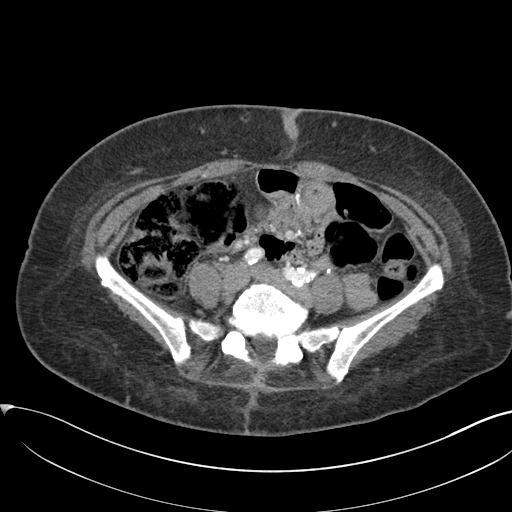
[im 49/97  soft-tissue]
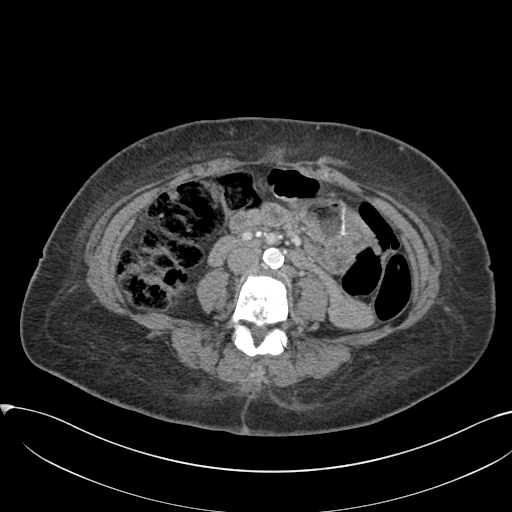
[im 55/97  soft-tissue]
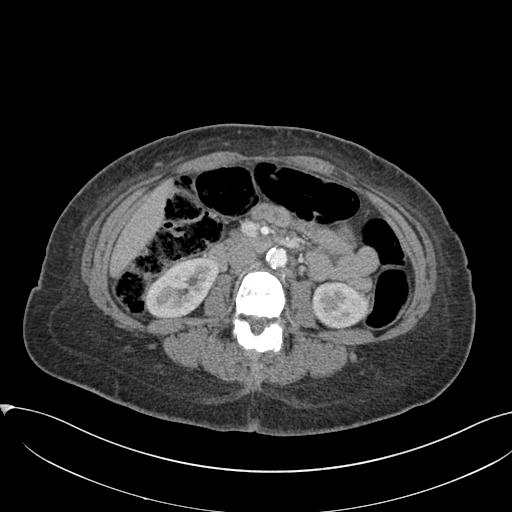
[im 62/97  soft-tissue]
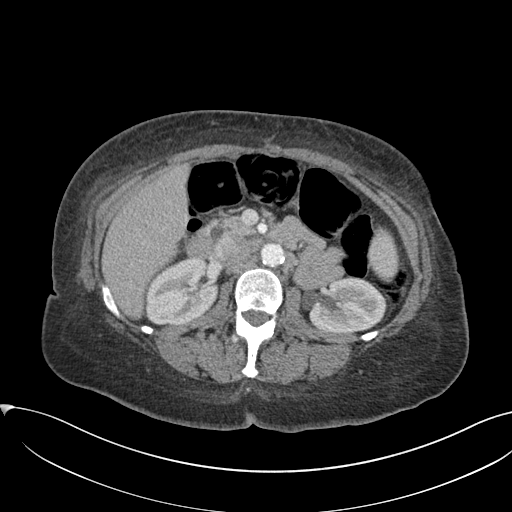
[im 62/97  bone]
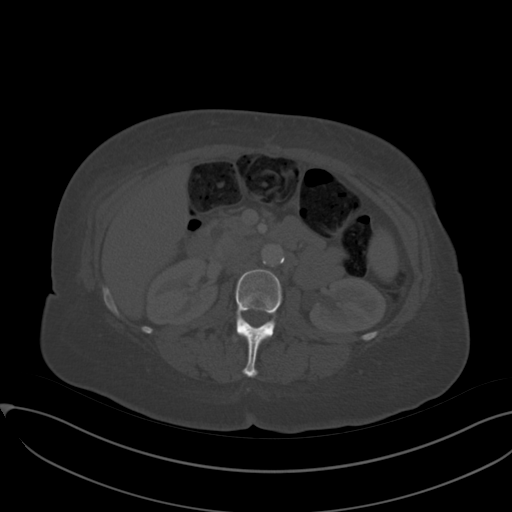
[im 69/97  soft-tissue]
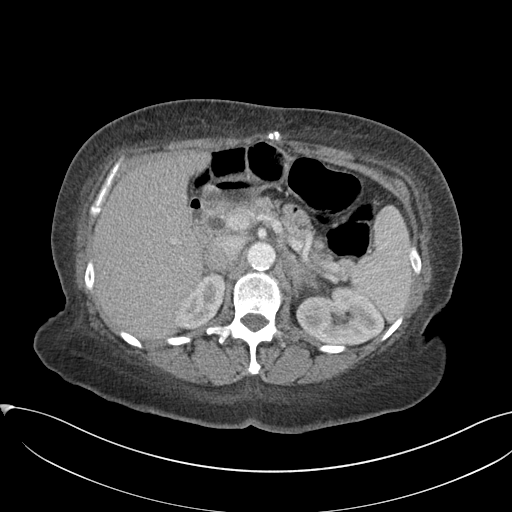
[im 76/97  soft-tissue]
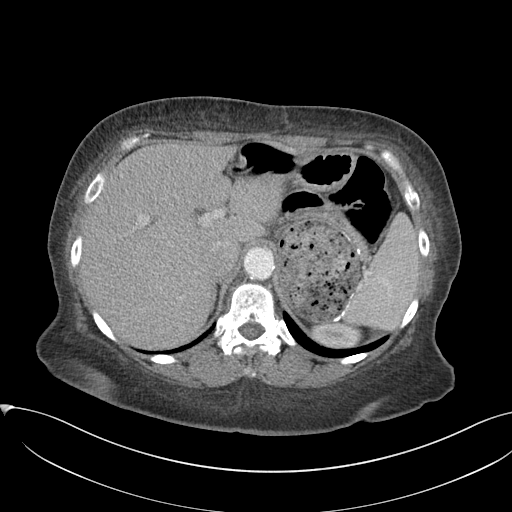
[im 83/97  soft-tissue]
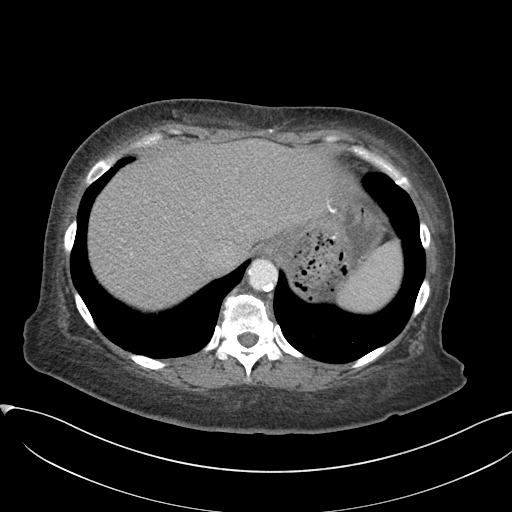
[im 90/97  soft-tissue]
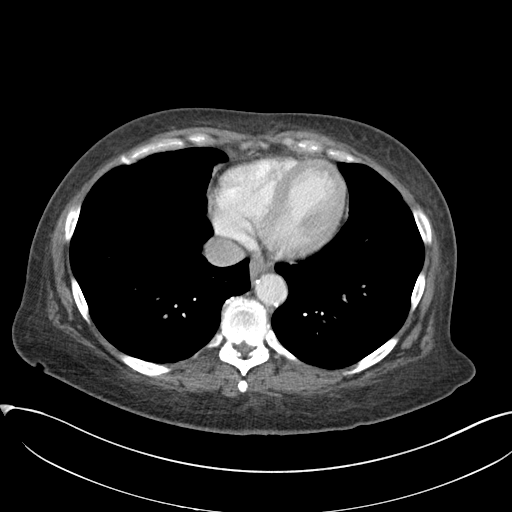

[Series 5: coronal st · coronal · 0.84mm/px · 3 of 107 slices shown]
[im 36/107  soft-tissue]
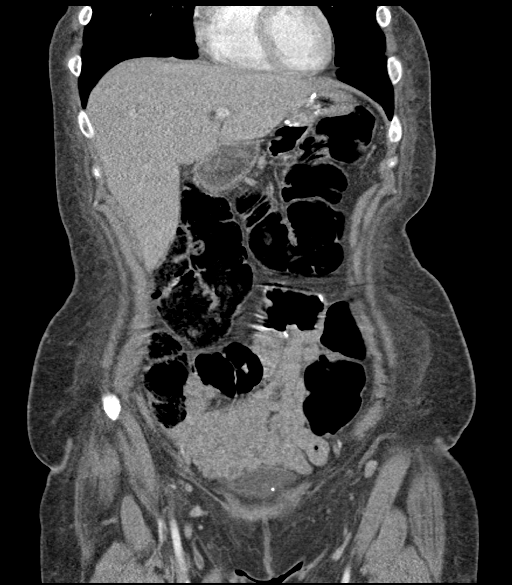
[im 48/107  soft-tissue]
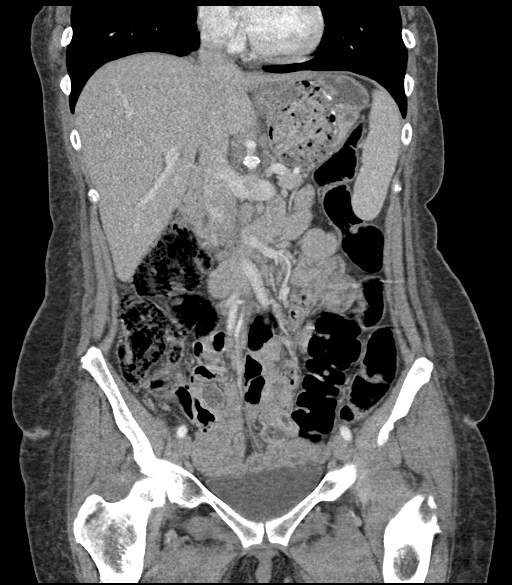
[im 59/107  soft-tissue]
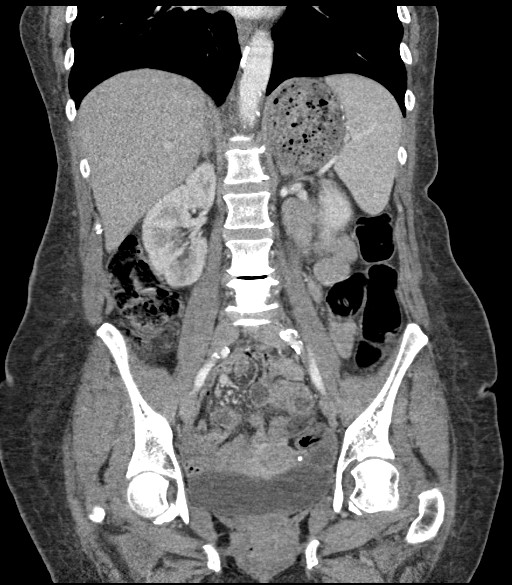

[16 of 46 positions shown; findings below may reference images not displayed]

FINDINGS: Lower chest: Normal heart size. Dependent atelectasis within the
bilateral lower lobes. No pleural effusion.

Hepatobiliary: Liver is normal in size and contour. Stable small
cyst peripheral right hepatic lobe (image 24; series 2). Prior
cholecystectomy. Prominent common bile duct likely physiologic given
cholecystectomy state.

Pancreas: Unremarkable

Spleen: Unremarkable

Adrenals/Urinary Tract: Normal adrenal glands. Kidneys enhance
symmetrically with contrast. Left extrarenal pelvis. No
hydronephrosis. 7 mm nodule within the posterior aspect of the
urinary bladder (image 84; series 2).

Stomach/Bowel: Material is demonstrated within the gastric pouch.
Remainder of the small bowel is nondilated. Stool throughout the
colon as can be seen with constipation. No free fluid or free
intraperitoneal air.

Vascular/Lymphatic: Normal caliber abdominal aorta. Peripheral
calcified atherosclerotic plaque. Prominent subcentimeter
retroperitoneal lymph nodes, similar to prior.

Reproductive: Unremarkable.

Other: None.

Musculoskeletal: Lumbar spine degenerative changes. No aggressive or
acute appearing osseous lesions.
IMPRESSION: 1. Mixed density material demonstrated within the gastric pouch
which may represent recently ingested material. The possibility of
slow transit from the gastric pouch to the proximal small bowel is
not excluded.
2. Remainder of the small bowel and colon are unremarkable.
3. Small nodule within the urinary bladder. If not previously
performed, consider further evaluation with direct visualization to
exclude the possibility of true bladder lesion/neoplasm.

## 2018-11-17 IMAGING — CT CT RENAL STONE PROTOCOL
2 of 4 series · 16 of 46 positions shown, 18 images · non-contrast
Comparison: 04/02/2018 CT abdomen and pelvis.

CLINICAL DATA: 42 y/o  F; 2 weeks of upper abdominal pain.

EXAM:
CT ABDOMEN AND PELVIS WITHOUT CONTRAST
TECHNIQUE: Multidetector CT imaging of the abdomen and pelvis was performed
following the standard protocol without IV contrast.

[Series 3: stone study 5.0 i30f 2 · axial · 0.88mm/px · z∈[+742,+1202]mm · 13 of 100 slices shown, 15 images]
[im 4/100  soft-tissue]
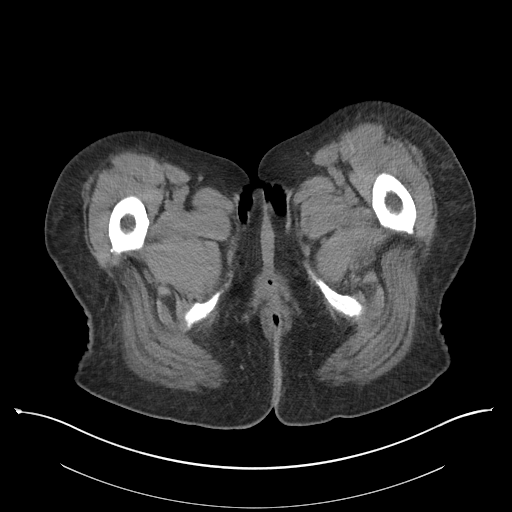
[im 4/100  bone]
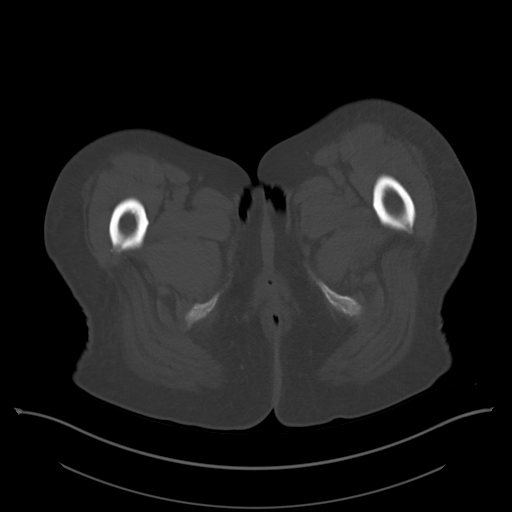
[im 12/100  soft-tissue]
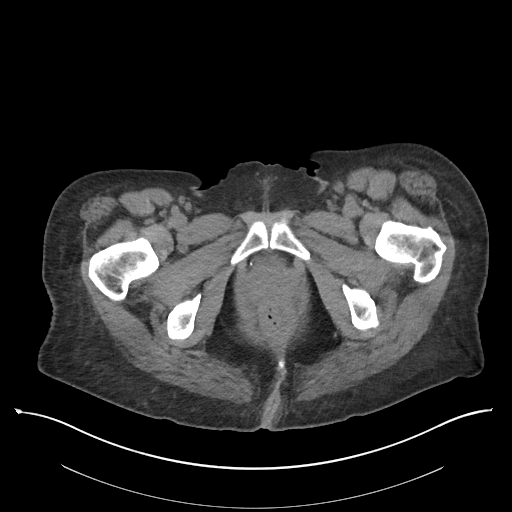
[im 20/100  soft-tissue]
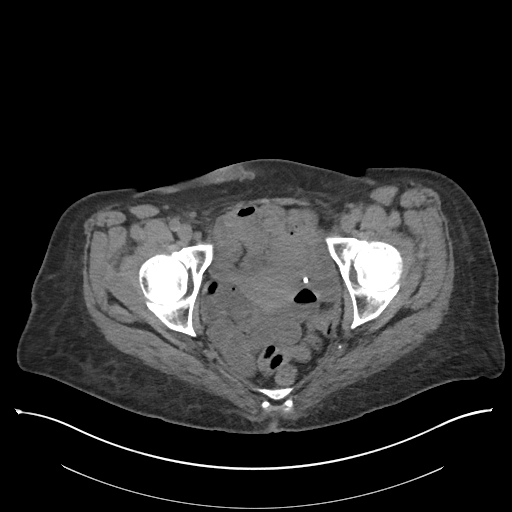
[im 28/100  soft-tissue]
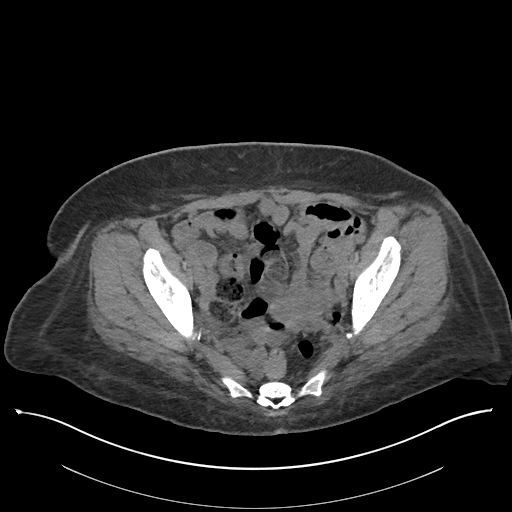
[im 36/100  soft-tissue]
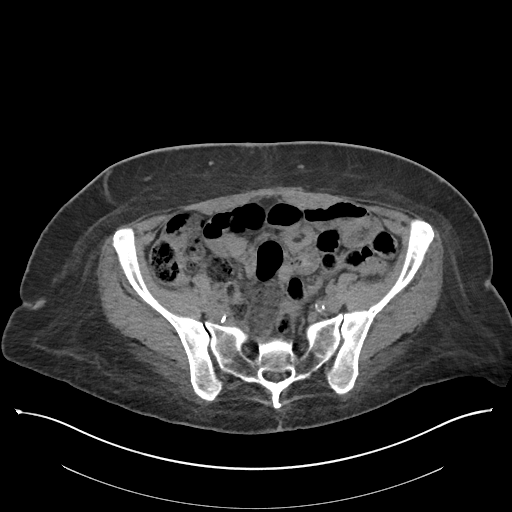
[im 44/100  soft-tissue]
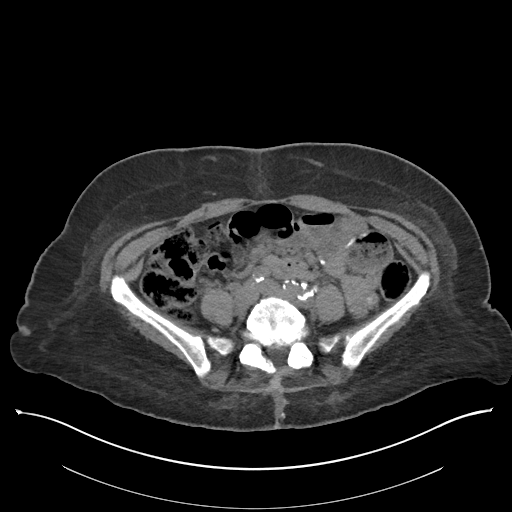
[im 52/100  soft-tissue]
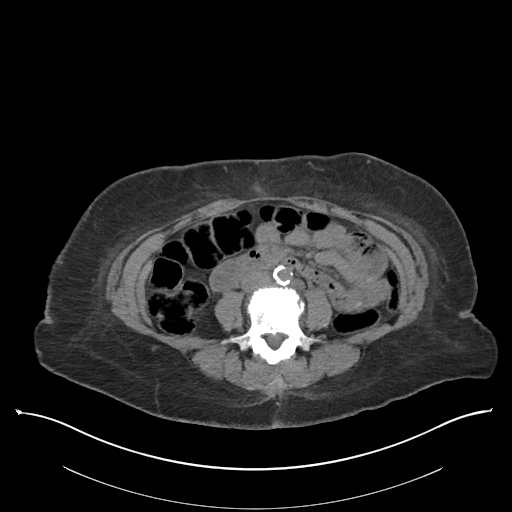
[im 56/100  soft-tissue]
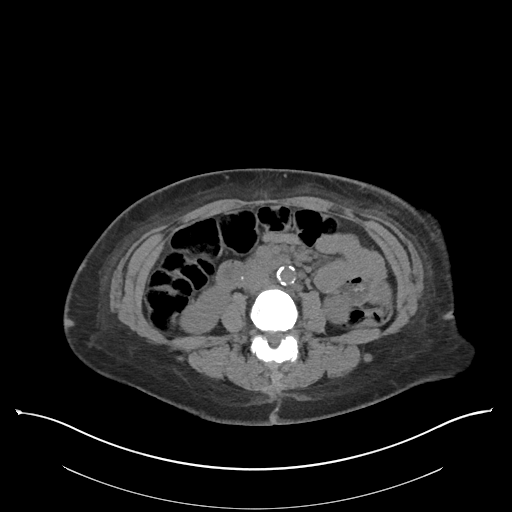
[im 64/100  soft-tissue]
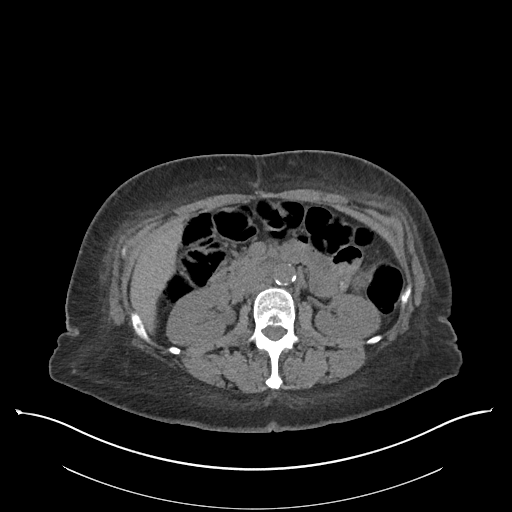
[im 64/100  bone]
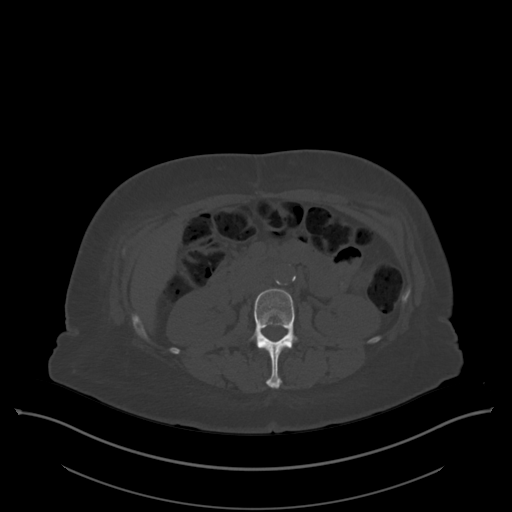
[im 72/100  soft-tissue]
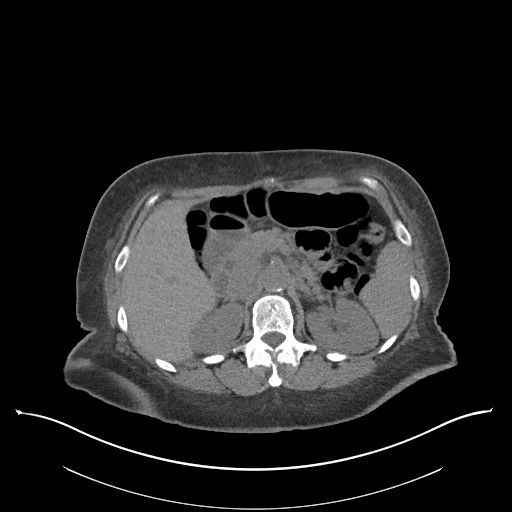
[im 80/100  soft-tissue]
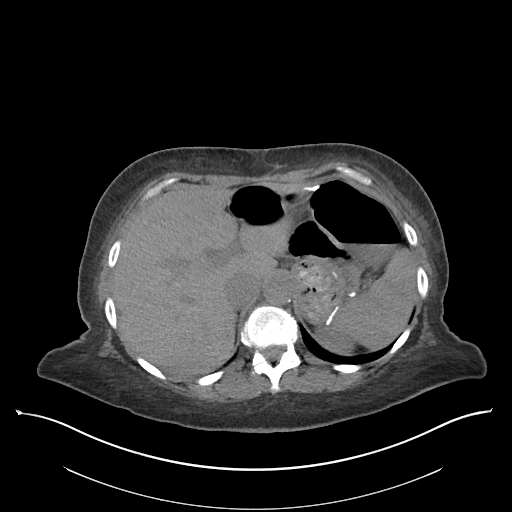
[im 88/100  soft-tissue]
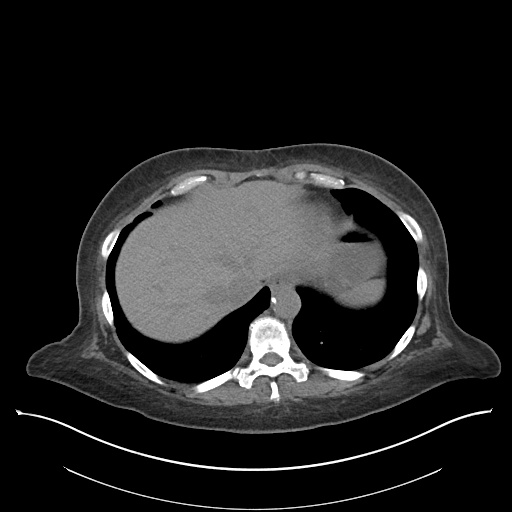
[im 96/100  soft-tissue]
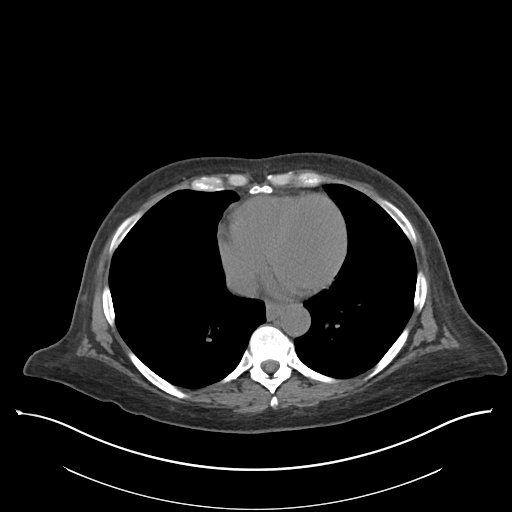

[Series 6: coronal soft tissue · coronal · 0.97mm/px · 3 of 101 slices shown]
[im 34/101  soft-tissue]
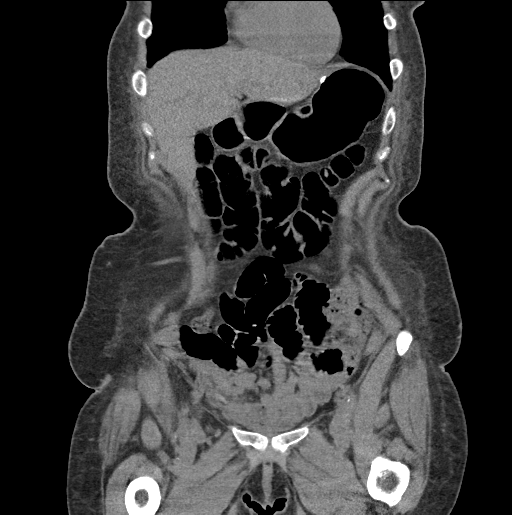
[im 45/101  soft-tissue]
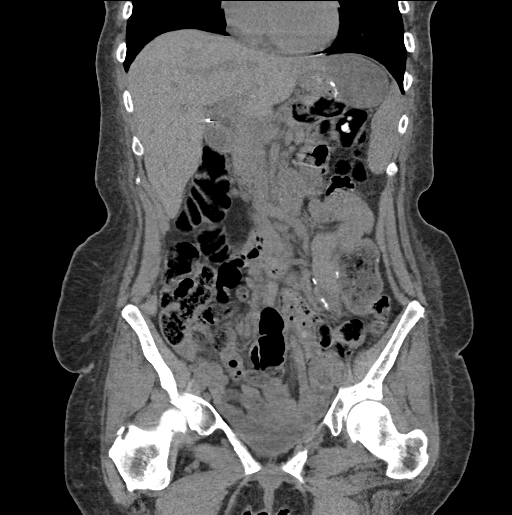
[im 56/101  soft-tissue]
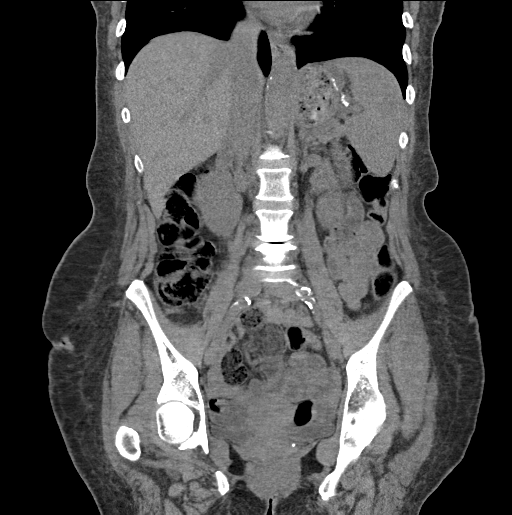

[16 of 46 positions shown; findings below may reference images not displayed]

FINDINGS: Lower chest: No acute abnormality.

Hepatobiliary: No focal liver abnormality is seen. No gallstones,
gallbladder wall thickening, or biliary dilatation.

Pancreas: Unremarkable. No pancreatic ductal dilatation or
surrounding inflammatory changes.

Spleen: Normal in size without focal abnormality.

Adrenals/Urinary Tract: Adrenal glands are unremarkable. Kidneys are
normal, without renal calculi, focal lesion, or hydronephrosis.
Bladder is partially collapsed, poor visualization of the 7 mm
nodule within the right posterior bladder as seen on prior CT.

Stomach/Bowel: Gastric bypass postsurgical changes. Ingested
material within the gastric pouch is decreased when compared with
the prior study.. Appendix appears normal. No evidence of bowel wall
thickening, distention, or inflammatory changes.

Vascular/Lymphatic: Aortic atherosclerosis. No enlarged abdominal or
pelvic lymph nodes.

Reproductive: Uterus and bilateral adnexa are unremarkable.
Bilateral tubal ligation.

Other: Chronic postsurgical changes within the anterior abdominal
wall in small paraumbilical hernia containing fat are stable.

Musculoskeletal: No fracture is seen. Age advanced spondylosis of
the lumbar spine from L3 through S1 with loss of intervertebral disc
space height, vacuum phenomenon of the intervertebral discs,
endplate sclerosis, and facet arthropathy. L4-S1 laminectomy
postsurgical changes.
IMPRESSION: 1. Gastric bypass postsurgical changes. Ingested material within the
gastric pouch is decreased when compared with the prior study,
however, findings may represent slow transit/partial obstruction
from the gastric pouch to the small bowel as described on the prior
study.
2.  Aortic Atherosclerosis (E2B7L-[id number].M).
3. Age advanced spondylosis of the lumbar spine.
4. Bladder is partially collapsed, previously described bladder wall
nodule not visualized on the current study. Consider direct
visualization to exclude urothelial neoplasm.

## 2019-01-27 IMAGING — US US ABDOMEN COMPLETE
1 series · 14 of 25 positions shown · non-contrast
Comparison: None.

CLINICAL DATA: Epigastric pain for 3 months.

EXAM:
ABDOMEN ULTRASOUND COMPLETE

[Series 1: us abdomen complete · 0.25mm/px · 14 of 91 slices shown]
[im 1/91]
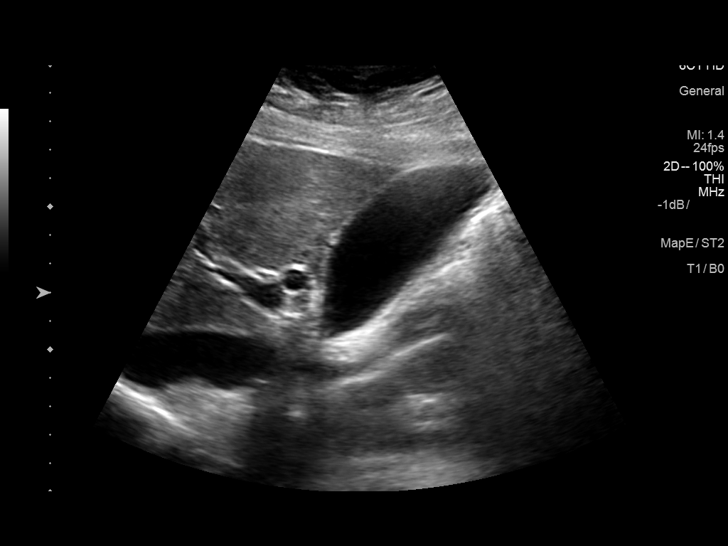
[im 8/91]
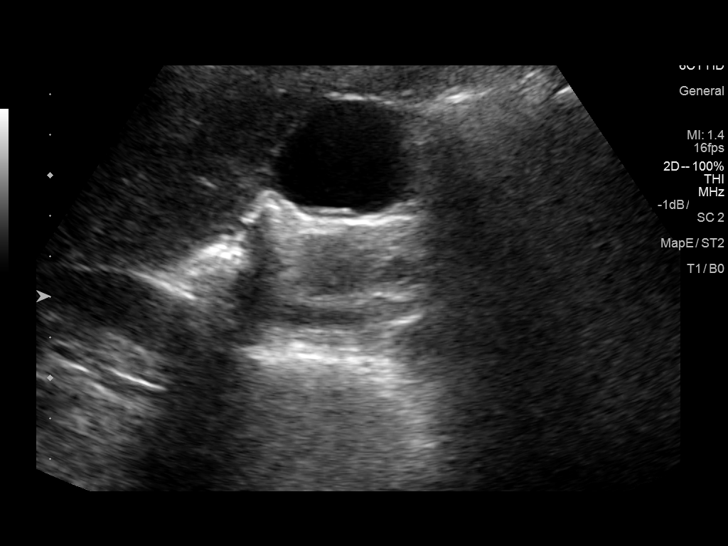
[im 16/91]
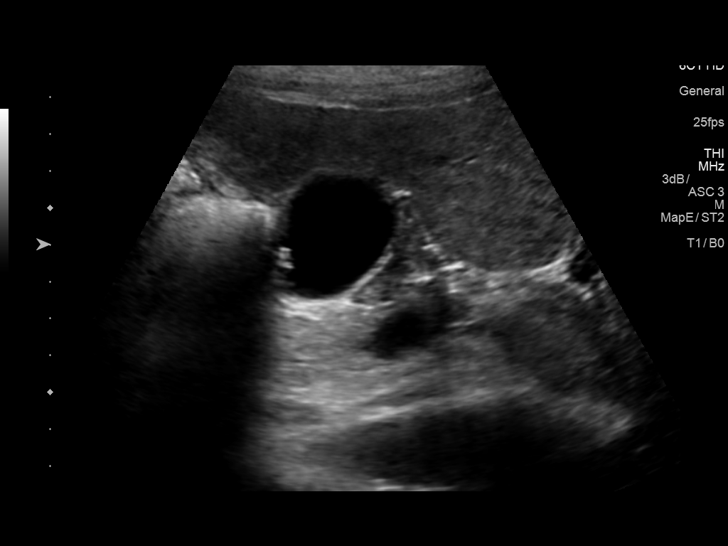
[im 23/91]
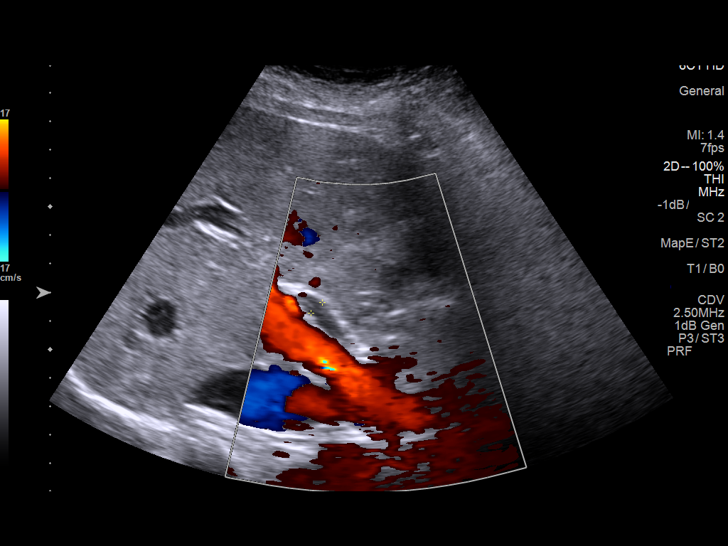
[im 31/91]
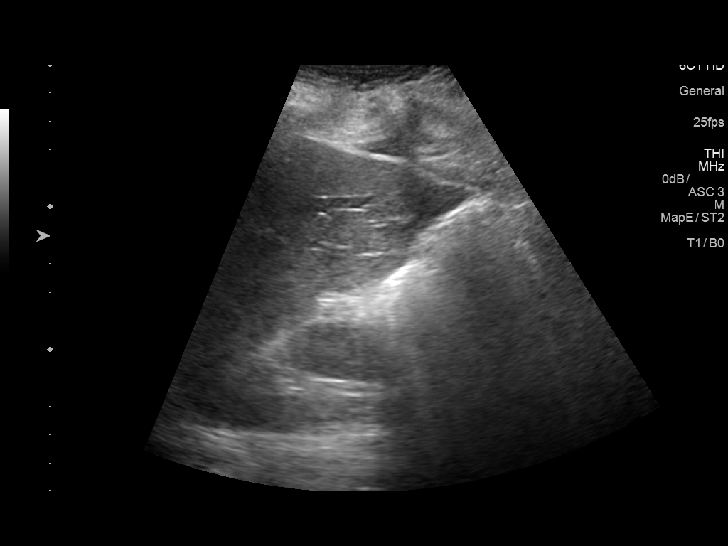
[im 34/91]
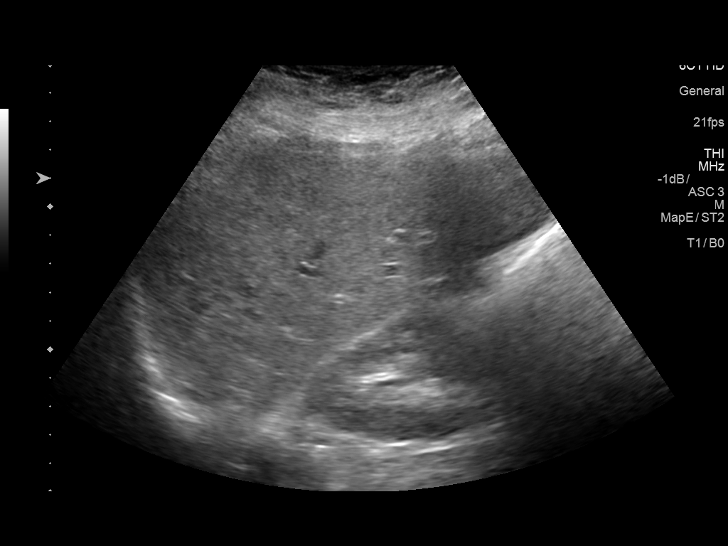
[im 42/91]
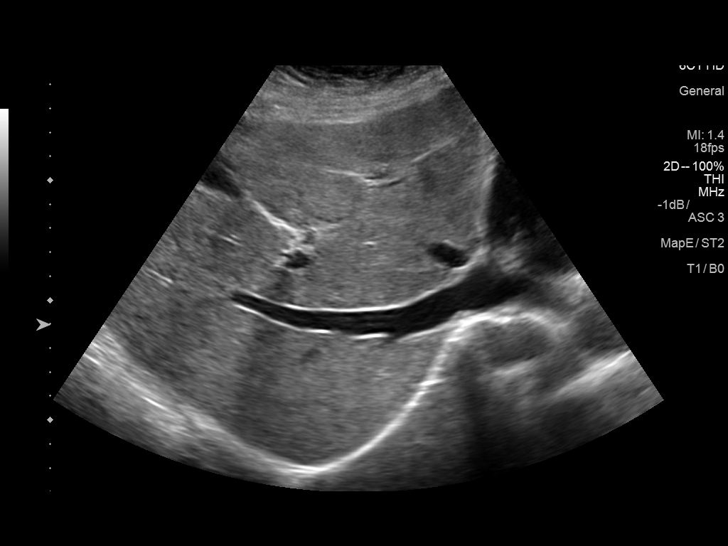
[im 49/91]
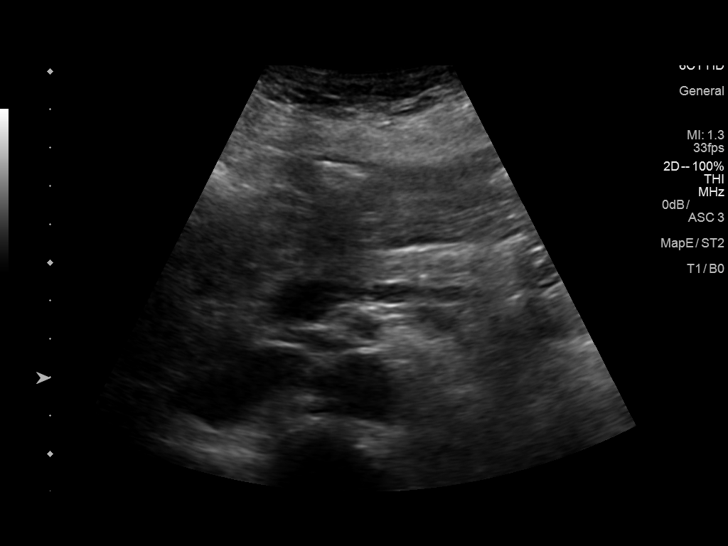
[im 57/91]
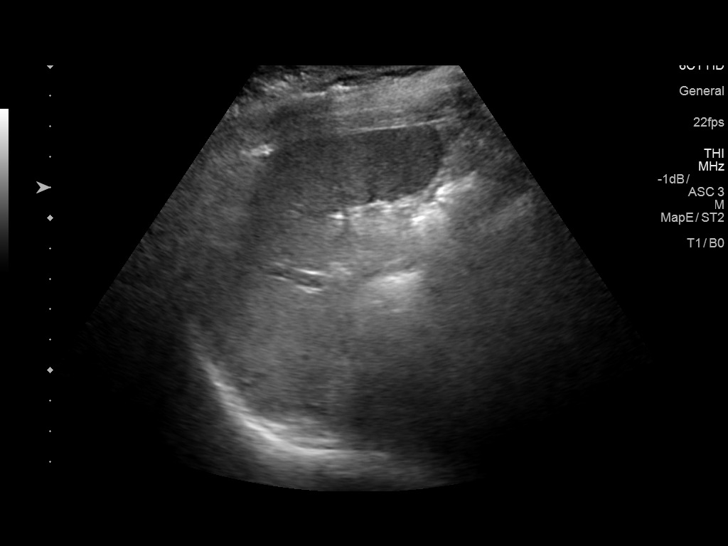
[im 61/91]
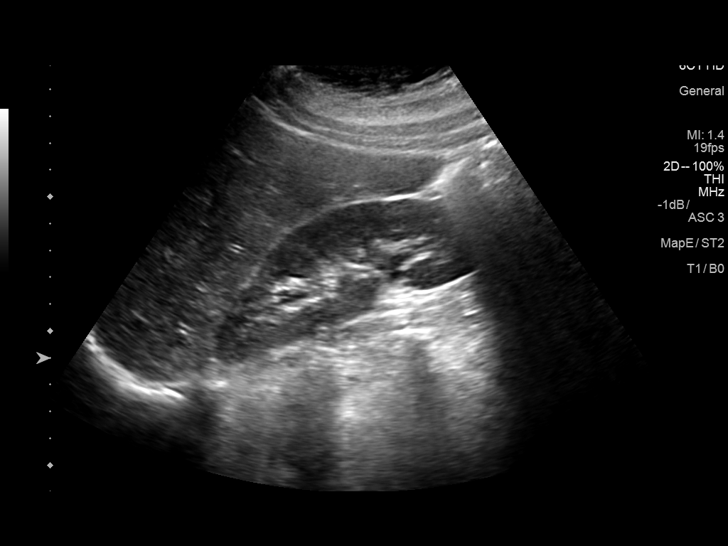
[im 68/91]
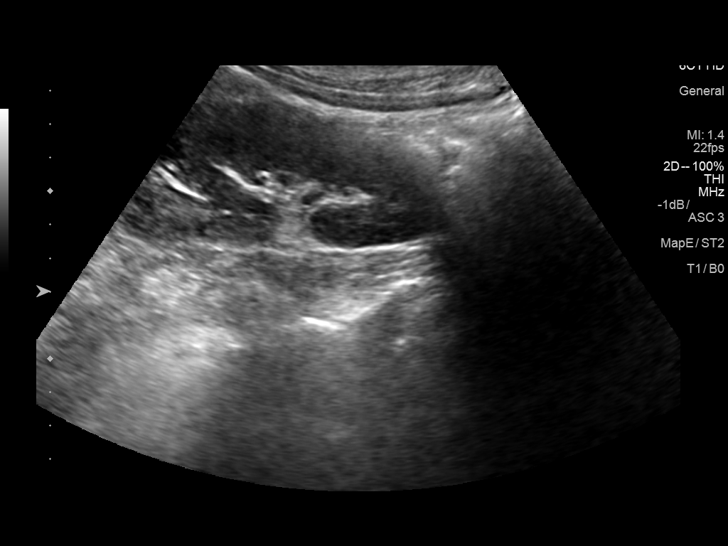
[im 76/91]
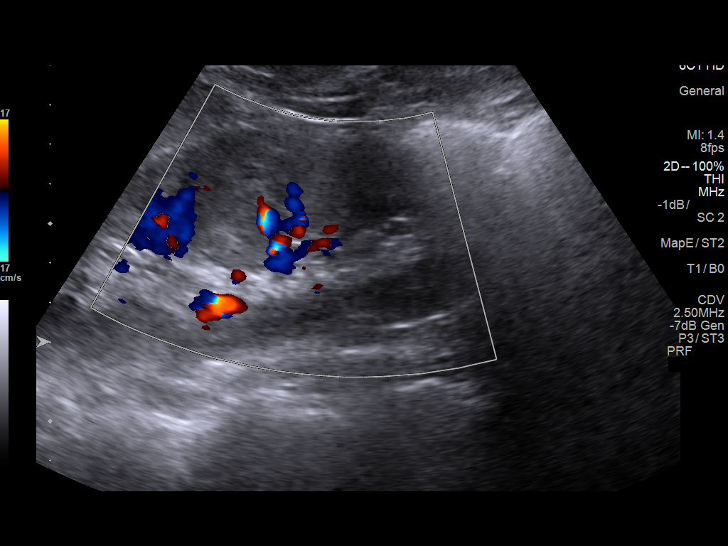
[im 83/91]
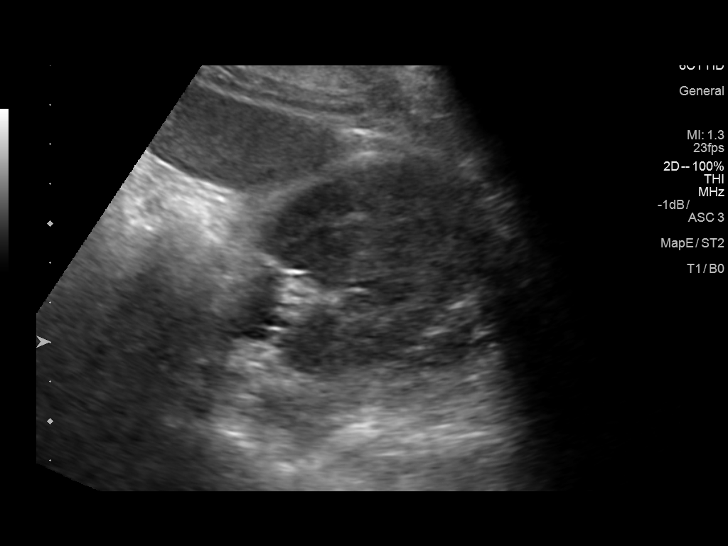
[im 91/91]
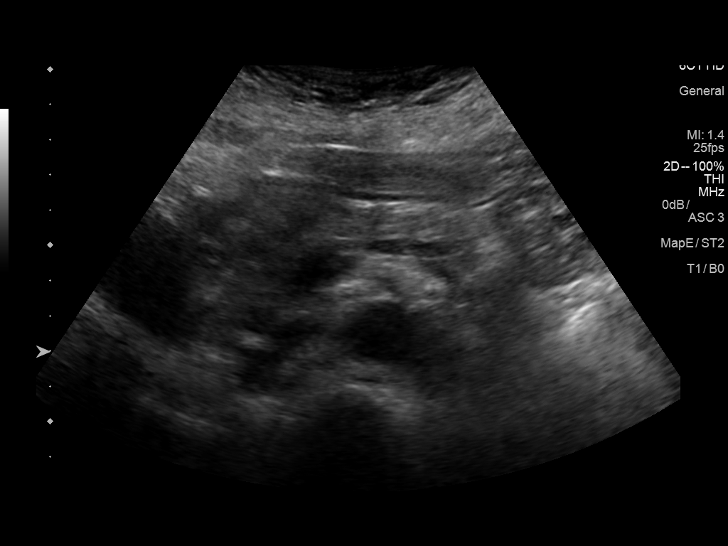

[14 of 25 positions shown; findings below may reference images not displayed]

FINDINGS: Gallbladder: A few tiny less than 1 cm gallstones are seen. No
evidence of gallbladder wall thickening or pericholecystic fluid. No
sonographic Murphy sign noted by sonographer.

Common bile duct: Diameter: 8 mm, mildly dilated.

Liver: No focal lesion identified. Within normal limits in
parenchymal echogenicity.

IVC: No abnormality visualized.

Pancreas: Visualized portion unremarkable.

Spleen: Size and appearance within normal limits.

Right Kidney: Length: 11.1 cm. Echogenicity within normal limits. No
mass or hydronephrosis visualized.

Left Kidney: Length: 12.7 cm. Echogenicity within normal limits. No
mass or hydronephrosis visualized.

Abdominal aorta: No aneurysm visualized.

Other findings: None.
IMPRESSION: Tiny gallstones.  No sonographic signs of acute cholecystitis.

Mild biliary ductal dilatation, with common bile duct measuring 8
mm. Recommend correlation with liver function tests, and consider
abdomen MRI and MRCP without and with contrast if clinically
warranted.

## 2019-02-02 ENCOUNTER — Other Ambulatory Visit: Payer: Self-pay

## 2019-02-02 ENCOUNTER — Inpatient Hospital Stay (HOSPITAL_COMMUNITY)
Admission: EM | Admit: 2019-02-02 | Discharge: 2019-02-11 | DRG: 871 | Disposition: A | Discharge status: Home / Self Care | Payer: Medicare Other | Attending: Internal Medicine | Admitting: Internal Medicine

## 2019-02-02 ENCOUNTER — Emergency Department (HOSPITAL_COMMUNITY): Payer: Medicare Other

## 2019-02-02 ENCOUNTER — Encounter (HOSPITAL_COMMUNITY): Payer: Self-pay

## 2019-02-02 DIAGNOSIS — E876 Hypokalemia: Secondary | ICD-10-CM | POA: Diagnosis present

## 2019-02-02 DIAGNOSIS — K209 Esophagitis, unspecified without bleeding: Secondary | ICD-10-CM | POA: Diagnosis present

## 2019-02-02 DIAGNOSIS — I5033 Acute on chronic diastolic (congestive) heart failure: Secondary | ICD-10-CM | POA: Diagnosis present

## 2019-02-02 DIAGNOSIS — R079 Chest pain, unspecified: Secondary | ICD-10-CM | POA: Diagnosis present

## 2019-02-02 DIAGNOSIS — N390 Urinary tract infection, site not specified: Secondary | ICD-10-CM | POA: Diagnosis not present

## 2019-02-02 DIAGNOSIS — M47816 Spondylosis without myelopathy or radiculopathy, lumbar region: Secondary | ICD-10-CM | POA: Diagnosis present

## 2019-02-02 DIAGNOSIS — D649 Anemia, unspecified: Secondary | ICD-10-CM | POA: Diagnosis not present

## 2019-02-02 DIAGNOSIS — B9620 Unspecified Escherichia coli [E. coli] as the cause of diseases classified elsewhere: Secondary | ICD-10-CM | POA: Diagnosis not present

## 2019-02-02 DIAGNOSIS — N179 Acute kidney failure, unspecified: Secondary | ICD-10-CM | POA: Diagnosis present

## 2019-02-02 DIAGNOSIS — R7881 Bacteremia: Secondary | ICD-10-CM | POA: Diagnosis not present

## 2019-02-02 DIAGNOSIS — G8929 Other chronic pain: Secondary | ICD-10-CM | POA: Diagnosis present

## 2019-02-02 DIAGNOSIS — R935 Abnormal findings on diagnostic imaging of other abdominal regions, including retroperitoneum: Secondary | ICD-10-CM | POA: Diagnosis not present

## 2019-02-02 DIAGNOSIS — D508 Other iron deficiency anemias: Secondary | ICD-10-CM | POA: Diagnosis not present

## 2019-02-02 DIAGNOSIS — I509 Heart failure, unspecified: Secondary | ICD-10-CM | POA: Diagnosis not present

## 2019-02-02 DIAGNOSIS — D509 Iron deficiency anemia, unspecified: Secondary | ICD-10-CM | POA: Diagnosis present

## 2019-02-02 DIAGNOSIS — D62 Acute posthemorrhagic anemia: Secondary | ICD-10-CM | POA: Diagnosis present

## 2019-02-02 DIAGNOSIS — N3000 Acute cystitis without hematuria: Secondary | ICD-10-CM | POA: Diagnosis not present

## 2019-02-02 DIAGNOSIS — K257 Chronic gastric ulcer without hemorrhage or perforation: Secondary | ICD-10-CM | POA: Diagnosis not present

## 2019-02-02 DIAGNOSIS — D638 Anemia in other chronic diseases classified elsewhere: Secondary | ICD-10-CM | POA: Diagnosis present

## 2019-02-02 DIAGNOSIS — K316 Fistula of stomach and duodenum: Secondary | ICD-10-CM | POA: Diagnosis present

## 2019-02-02 DIAGNOSIS — K259 Gastric ulcer, unspecified as acute or chronic, without hemorrhage or perforation: Secondary | ICD-10-CM | POA: Diagnosis present

## 2019-02-02 DIAGNOSIS — F329 Major depressive disorder, single episode, unspecified: Secondary | ICD-10-CM | POA: Diagnosis not present

## 2019-02-02 DIAGNOSIS — R8281 Pyuria: Secondary | ICD-10-CM | POA: Diagnosis not present

## 2019-02-02 DIAGNOSIS — I10 Essential (primary) hypertension: Secondary | ICD-10-CM | POA: Diagnosis not present

## 2019-02-02 DIAGNOSIS — R1084 Generalized abdominal pain: Secondary | ICD-10-CM | POA: Diagnosis not present

## 2019-02-02 DIAGNOSIS — D6959 Other secondary thrombocytopenia: Secondary | ICD-10-CM | POA: Diagnosis present

## 2019-02-02 DIAGNOSIS — E860 Dehydration: Secondary | ICD-10-CM | POA: Diagnosis present

## 2019-02-02 DIAGNOSIS — A419 Sepsis, unspecified organism: Secondary | ICD-10-CM | POA: Diagnosis not present

## 2019-02-02 DIAGNOSIS — Z86718 Personal history of other venous thrombosis and embolism: Secondary | ICD-10-CM

## 2019-02-02 DIAGNOSIS — Z20822 Contact with and (suspected) exposure to covid-19: Secondary | ICD-10-CM | POA: Diagnosis present

## 2019-02-02 DIAGNOSIS — A4151 Sepsis due to Escherichia coli [E. coli]: Principal | ICD-10-CM | POA: Diagnosis present

## 2019-02-02 DIAGNOSIS — Z79899 Other long term (current) drug therapy: Secondary | ICD-10-CM

## 2019-02-02 DIAGNOSIS — D72829 Elevated white blood cell count, unspecified: Secondary | ICD-10-CM | POA: Diagnosis not present

## 2019-02-02 DIAGNOSIS — R509 Fever, unspecified: Secondary | ICD-10-CM | POA: Diagnosis present

## 2019-02-02 DIAGNOSIS — T502X5A Adverse effect of carbonic-anhydrase inhibitors, benzothiadiazides and other diuretics, initial encounter: Secondary | ICD-10-CM | POA: Diagnosis present

## 2019-02-02 DIAGNOSIS — F419 Anxiety disorder, unspecified: Secondary | ICD-10-CM | POA: Diagnosis present

## 2019-02-02 DIAGNOSIS — R1310 Dysphagia, unspecified: Secondary | ICD-10-CM | POA: Diagnosis present

## 2019-02-02 DIAGNOSIS — K2970 Gastritis, unspecified, without bleeding: Secondary | ICD-10-CM | POA: Diagnosis present

## 2019-02-02 DIAGNOSIS — I110 Hypertensive heart disease with heart failure: Secondary | ICD-10-CM | POA: Diagnosis present

## 2019-02-02 DIAGNOSIS — R0902 Hypoxemia: Secondary | ICD-10-CM | POA: Diagnosis present

## 2019-02-02 DIAGNOSIS — F17210 Nicotine dependence, cigarettes, uncomplicated: Secondary | ICD-10-CM | POA: Diagnosis present

## 2019-02-02 DIAGNOSIS — Z9884 Bariatric surgery status: Secondary | ICD-10-CM

## 2019-02-02 DIAGNOSIS — Z8249 Family history of ischemic heart disease and other diseases of the circulatory system: Secondary | ICD-10-CM

## 2019-02-02 DIAGNOSIS — N10 Acute tubulo-interstitial nephritis: Secondary | ICD-10-CM | POA: Diagnosis present

## 2019-02-02 DIAGNOSIS — Z888 Allergy status to other drugs, medicaments and biological substances status: Secondary | ICD-10-CM

## 2019-02-02 DIAGNOSIS — Z8719 Personal history of other diseases of the digestive system: Secondary | ICD-10-CM | POA: Diagnosis not present

## 2019-02-02 DIAGNOSIS — D473 Essential (hemorrhagic) thrombocythemia: Secondary | ICD-10-CM | POA: Diagnosis present

## 2019-02-02 DIAGNOSIS — Z886 Allergy status to analgesic agent status: Secondary | ICD-10-CM

## 2019-02-02 DIAGNOSIS — F319 Bipolar disorder, unspecified: Secondary | ICD-10-CM | POA: Diagnosis present

## 2019-02-02 DIAGNOSIS — Z8701 Personal history of pneumonia (recurrent): Secondary | ICD-10-CM

## 2019-02-02 DIAGNOSIS — I809 Phlebitis and thrombophlebitis of unspecified site: Secondary | ICD-10-CM | POA: Diagnosis not present

## 2019-02-02 DIAGNOSIS — T182XXA Foreign body in stomach, initial encounter: Secondary | ICD-10-CM | POA: Diagnosis not present

## 2019-02-02 DIAGNOSIS — Z934 Other artificial openings of gastrointestinal tract status: Secondary | ICD-10-CM

## 2019-02-02 DIAGNOSIS — N2881 Hypertrophy of kidney: Secondary | ICD-10-CM | POA: Diagnosis present

## 2019-02-02 DIAGNOSIS — K253 Acute gastric ulcer without hemorrhage or perforation: Secondary | ICD-10-CM | POA: Diagnosis not present

## 2019-02-02 DIAGNOSIS — Z980 Intestinal bypass and anastomosis status: Secondary | ICD-10-CM | POA: Diagnosis not present

## 2019-02-02 DIAGNOSIS — R4702 Dysphasia: Secondary | ICD-10-CM | POA: Diagnosis not present

## 2019-02-02 DIAGNOSIS — R112 Nausea with vomiting, unspecified: Secondary | ICD-10-CM | POA: Diagnosis not present

## 2019-02-02 DIAGNOSIS — N12 Tubulo-interstitial nephritis, not specified as acute or chronic: Secondary | ICD-10-CM | POA: Diagnosis not present

## 2019-02-02 DIAGNOSIS — Z20828 Contact with and (suspected) exposure to other viral communicable diseases: Secondary | ICD-10-CM | POA: Diagnosis present

## 2019-02-02 DIAGNOSIS — R609 Edema, unspecified: Secondary | ICD-10-CM

## 2019-02-02 DIAGNOSIS — M1991 Primary osteoarthritis, unspecified site: Secondary | ICD-10-CM | POA: Diagnosis present

## 2019-02-02 DIAGNOSIS — D75839 Thrombocytosis, unspecified: Secondary | ICD-10-CM | POA: Diagnosis present

## 2019-02-02 DIAGNOSIS — R0602 Shortness of breath: Secondary | ICD-10-CM | POA: Diagnosis present

## 2019-02-02 DIAGNOSIS — K279 Peptic ulcer, site unspecified, unspecified as acute or chronic, without hemorrhage or perforation: Secondary | ICD-10-CM | POA: Diagnosis present

## 2019-02-02 DIAGNOSIS — R6520 Severe sepsis without septic shock: Secondary | ICD-10-CM | POA: Diagnosis not present

## 2019-02-02 DIAGNOSIS — Z9049 Acquired absence of other specified parts of digestive tract: Secondary | ICD-10-CM

## 2019-02-02 LAB — CBC WITH DIFFERENTIAL/PLATELET
Abs Immature Granulocytes: 0.02 10*3/uL (ref 0.00–0.07)
Basophils Absolute: 0.1 10*3/uL (ref 0.0–0.1)
Basophils Relative: 0 %
Eosinophils Absolute: 0.1 10*3/uL (ref 0.0–0.5)
Eosinophils Relative: 1 %
HCT: 27.5 % — ABNORMAL LOW (ref 36.0–46.0)
Hemoglobin: 9 g/dL — ABNORMAL LOW (ref 12.0–15.0)
Immature Granulocytes: 0 %
Lymphocytes Relative: 3 %
Lymphs Abs: 0.4 10*3/uL — ABNORMAL LOW (ref 0.7–4.0)
MCH: 22.1 pg — ABNORMAL LOW (ref 26.0–34.0)
MCHC: 32.7 g/dL (ref 30.0–36.0)
MCV: 67.4 fL — ABNORMAL LOW (ref 80.0–100.0)
Monocytes Absolute: 0.8 10*3/uL (ref 0.1–1.0)
Monocytes Relative: 6 %
Neutro Abs: 11.3 10*3/uL — ABNORMAL HIGH (ref 1.7–7.7)
Neutrophils Relative %: 90 %
Platelets: 119 10*3/uL — ABNORMAL LOW (ref 150–400)
RBC: 4.08 MIL/uL (ref 3.87–5.11)
RDW: 18.2 % — ABNORMAL HIGH (ref 11.5–15.5)
WBC: 12.6 10*3/uL — ABNORMAL HIGH (ref 4.0–10.5)
nRBC: 0 % (ref 0.0–0.2)

## 2019-02-02 LAB — COMPREHENSIVE METABOLIC PANEL
ALT: 18 U/L (ref 0–44)
AST: 33 U/L (ref 15–41)
Albumin: 2.5 g/dL — ABNORMAL LOW (ref 3.5–5.0)
Alkaline Phosphatase: 117 U/L (ref 38–126)
Anion gap: 14 (ref 5–15)
BUN: 30 mg/dL — ABNORMAL HIGH (ref 6–20)
CO2: 23 mmol/L (ref 22–32)
Calcium: 7.8 mg/dL — ABNORMAL LOW (ref 8.9–10.3)
Chloride: 99 mmol/L (ref 98–111)
Creatinine, Ser: 1.7 mg/dL — ABNORMAL HIGH (ref 0.44–1.00)
GFR calc Af Amer: 41 mL/min — ABNORMAL LOW (ref >60)
GFR calc non Af Amer: 36 mL/min — ABNORMAL LOW (ref >60)
Glucose, Bld: 53 mg/dL — ABNORMAL LOW (ref 70–99)
Potassium: 3.4 mmol/L — ABNORMAL LOW (ref 3.5–5.1)
Sodium: 136 mmol/L (ref 135–145)
Total Bilirubin: 1.9 mg/dL — ABNORMAL HIGH (ref 0.3–1.2)
Total Protein: 5.7 g/dL — ABNORMAL LOW (ref 6.5–8.1)

## 2019-02-02 LAB — LACTIC ACID, PLASMA
Lactic Acid, Venous: 2.8 mmol/L (ref 0.5–1.9)
Lactic Acid, Venous: 1.7 mmol/L (ref 0.5–1.9)
Lactic Acid, Venous: 1.6 mmol/L (ref 0.5–1.9)

## 2019-02-02 LAB — URINALYSIS, ROUTINE W REFLEX MICROSCOPIC
Bilirubin Urine: NEGATIVE
Glucose, UA: NEGATIVE mg/dL
Ketones, ur: NEGATIVE mg/dL
Nitrite: NEGATIVE
Protein, ur: 100 mg/dL — AB
Specific Gravity, Urine: 1.011 (ref 1.005–1.030)
WBC, UA: >50 WBC/hpf — ABNORMAL HIGH (ref 0–5)
pH: 5 (ref 5.0–8.0)

## 2019-02-02 LAB — PROTIME-INR
INR: 1.1 (ref 0.8–1.2)
Prothrombin Time: 14.5 seconds (ref 11.4–15.2)

## 2019-02-02 LAB — POC URINE PREG, ED: Preg Test, Ur: NEGATIVE

## 2019-02-02 LAB — APTT: aPTT: 34 seconds (ref 24–36)

## 2019-02-02 LAB — SARS CORONAVIRUS 2 BY RT PCR (HOSPITAL ORDER, PERFORMED IN ~~LOC~~ HOSPITAL LAB): SARS Coronavirus 2: NEGATIVE

## 2019-02-02 MED ORDER — ALBUTEROL SULFATE HFA 108 (90 BASE) MCG/ACT IN AERS: 1.0000 | INHALATION_SPRAY | RESPIRATORY_TRACT | Status: DC | PRN

## 2019-02-02 MED ORDER — HYDROCHLOROTHIAZIDE 12.5 MG PO CAPS
12.5000 mg | ORAL_CAPSULE | Freq: Every day | ORAL | Status: DC
  Administered 2019-02-05 – 2019-02-09 (×5): 12.5 mg via ORAL
  Filled 2019-02-02 (×6): qty 1

## 2019-02-02 MED ORDER — SODIUM CHLORIDE 0.9 % IV SOLN
2.0000 g | Freq: Two times a day (BID) | INTRAVENOUS | Status: DC
  Administered 2019-02-03: 2 g via INTRAVENOUS
  Filled 2019-02-02: qty 2

## 2019-02-02 MED ORDER — VANCOMYCIN HCL IN DEXTROSE 1-5 GM/200ML-% IV SOLN: 1000.0000 mg | Freq: Once | INTRAVENOUS | Status: DC

## 2019-02-02 MED ORDER — PANTOPRAZOLE SODIUM 40 MG PO TBEC
40.0000 mg | DELAYED_RELEASE_TABLET | Freq: Every day | ORAL | Status: DC
  Administered 2019-02-03 – 2019-02-05 (×3): 40 mg via ORAL
  Filled 2019-02-02 (×3): qty 1

## 2019-02-02 MED ORDER — LISINOPRIL 10 MG PO TABS
10.0000 mg | ORAL_TABLET | Freq: Every day | ORAL | Status: DC
  Administered 2019-02-05 – 2019-02-11 (×7): 10 mg via ORAL
  Filled 2019-02-02 (×10): qty 1

## 2019-02-02 MED ORDER — GABAPENTIN 300 MG PO CAPS
300.0000 mg | ORAL_CAPSULE | Freq: Three times a day (TID) | ORAL | Status: DC
  Administered 2019-02-03 – 2019-02-11 (×26): 300 mg via ORAL
  Filled 2019-02-02 (×25): qty 1

## 2019-02-02 MED ORDER — SODIUM CHLORIDE 0.9 % IV SOLN
2.0000 g | Freq: Once | INTRAVENOUS | Status: AC
  Administered 2019-02-02: 18:00:00 2 g via INTRAVENOUS
  Filled 2019-02-02: qty 2

## 2019-02-02 MED ORDER — ONDANSETRON HCL 4 MG PO TABS: 4.0000 mg | ORAL_TABLET | Freq: Four times a day (QID) | ORAL | Status: DC | PRN

## 2019-02-02 MED ORDER — SODIUM CHLORIDE 0.9 % IV SOLN
INTRAVENOUS | Status: DC
  Administered 2019-02-03: via INTRAVENOUS

## 2019-02-02 MED ORDER — ALBUTEROL SULFATE (2.5 MG/3ML) 0.083% IN NEBU
2.5000 mg | INHALATION_SOLUTION | Freq: Four times a day (QID) | RESPIRATORY_TRACT | Status: DC | PRN
  Administered 2019-02-06: 18:00:00 2.5 mg via RESPIRATORY_TRACT
  Filled 2019-02-02: qty 3

## 2019-02-02 MED ORDER — ACETAMINOPHEN 500 MG PO TABS
1000.0000 mg | ORAL_TABLET | Freq: Once | ORAL | Status: AC
  Administered 2019-02-02: 15:00:00 1000 mg via ORAL
  Filled 2019-02-02: qty 2

## 2019-02-02 MED ORDER — AMITRIPTYLINE HCL 25 MG PO TABS
75.0000 mg | ORAL_TABLET | Freq: Every day | ORAL | Status: DC
  Administered 2019-02-03 – 2019-02-10 (×9): 75 mg via ORAL
  Filled 2019-02-02 (×9): qty 3

## 2019-02-02 MED ORDER — ONDANSETRON HCL 4 MG/2ML IJ SOLN: 4.0000 mg | Freq: Four times a day (QID) | INTRAMUSCULAR | Status: DC | PRN

## 2019-02-02 MED ORDER — ONDANSETRON HCL 4 MG/2ML IJ SOLN
4.0000 mg | Freq: Once | INTRAMUSCULAR | Status: AC
  Administered 2019-02-02: 16:00:00 4 mg via INTRAVENOUS
  Filled 2019-02-02: qty 2

## 2019-02-02 MED ORDER — ENOXAPARIN SODIUM 40 MG/0.4ML ~~LOC~~ SOLN
40.0000 mg | Freq: Every day | SUBCUTANEOUS | Status: DC
  Administered 2019-02-03: 40 mg via SUBCUTANEOUS
  Filled 2019-02-02: qty 0.4

## 2019-02-02 MED ORDER — SODIUM CHLORIDE 0.9 % IV SOLN: 1.0000 g | Freq: Once | INTRAVENOUS | Status: DC

## 2019-02-02 MED ORDER — SODIUM CHLORIDE 0.9 % IV BOLUS (SEPSIS)
1000.0000 mL | Freq: Once | INTRAVENOUS | Status: AC
  Administered 2019-02-02: 18:00:00 1000 mL via INTRAVENOUS

## 2019-02-02 MED ORDER — VANCOMYCIN HCL 10 G IV SOLR
1750.0000 mg | Freq: Once | INTRAVENOUS | Status: AC
  Administered 2019-02-02: 1750 mg via INTRAVENOUS
  Filled 2019-02-02: qty 1750

## 2019-02-02 MED ORDER — FENTANYL CITRATE (PF) 100 MCG/2ML IJ SOLN
25.0000 ug | Freq: Once | INTRAMUSCULAR | Status: AC
  Administered 2019-02-02: 25 ug via INTRAVENOUS
  Filled 2019-02-02: qty 2

## 2019-02-02 MED ORDER — SODIUM CHLORIDE 0.9 % IV BOLUS
500.0000 mL | Freq: Once | INTRAVENOUS | Status: AC
  Administered 2019-02-02: 16:00:00 500 mL via INTRAVENOUS

## 2019-02-02 MED ORDER — METRONIDAZOLE IN NACL 5-0.79 MG/ML-% IV SOLN
500.0000 mg | Freq: Once | INTRAVENOUS | Status: AC
  Administered 2019-02-02: 20:00:00 500 mg via INTRAVENOUS
  Filled 2019-02-02: qty 100

## 2019-02-02 MED ORDER — SODIUM CHLORIDE 0.9 % IV BOLUS (SEPSIS)
1000.0000 mL | Freq: Once | INTRAVENOUS | Status: AC
  Administered 2019-02-02: 1000 mL via INTRAVENOUS

## 2019-02-02 MED ORDER — LISINOPRIL-HYDROCHLOROTHIAZIDE 10-12.5 MG PO TABS: 1.0000 | ORAL_TABLET | Freq: Every day | ORAL | Status: DC

## 2019-02-02 MED ORDER — SODIUM CHLORIDE 0.9 % IV SOLN
INTRAVENOUS | Status: DC
  Administered 2019-02-03 (×2): via INTRAVENOUS

## 2019-02-02 MED ORDER — SODIUM CHLORIDE 0.9 % IV BOLUS (SEPSIS)
500.0000 mL | Freq: Once | INTRAVENOUS | Status: AC
  Administered 2019-02-02: 500 mL via INTRAVENOUS

## 2019-02-02 MED ORDER — VANCOMYCIN HCL 10 G IV SOLR: 1250.0000 mg | INTRAVENOUS | Status: DC

## 2019-02-02 MED ORDER — SODIUM CHLORIDE 0.9 % IV BOLUS: 1000.0000 mL | Freq: Once | INTRAVENOUS | Status: DC

## 2019-02-02 NOTE — ED Notes (Signed)
CRITICAL VALUE STICKER  CRITICAL VALUE: Lactic 2.8  RECEIVER (on-site recipient of call): Carmeline Kowal RN  [place] NOTIFIED: 02/02/19 1645  MESSENGER (representative from lab): Lab  MD NOTIFIED: Rogene Houston MD  TIME OF NOTIFICATION: [id number]  RESPONSE: Awaiting response

## 2019-02-02 NOTE — Progress Notes (Signed)
A consult was received from an ED physician for Vancomycin and Cefepime per pharmacy dosing.  The patient's profile has been reviewed for ht/wt/allergies/indication/available labs.    A one time order has been placed for Vancomycin 1750mg  IV and Cefepime 2g IV.  Further antibiotics/pharmacy consults should be ordered by admitting physician if indicated.                       Thank you, Luiz Ochoa 02/02/2019  4:54 PM

## 2019-02-02 NOTE — ED Provider Notes (Signed)
CRITICAL CARE Performed by: Fredia Sorrow Total critical care time: 45 minutes Critical care time was exclusive of separately billable procedures and treating other patients. Critical care was necessary to treat or prevent imminent or life-threatening deterioration. Critical care was time spent personally by me on the following activities: development of treatment plan with patient and/or surrogate as well as nursing, discussions with consultants, evaluation of patient's response to treatment, examination of patient, obtaining history from patient or surrogate, ordering and performing treatments and interventions, ordering and review of laboratory studies, ordering and review of radiographic studies, pulse oximetry and re-evaluation of patient's condition.   Patient's COVID test was negative chest x-ray negative for pneumonia.  Patient urinalysis suggesting urinary tract infection.  The patient was meeting sepsis criteria.  So sepsis order set was completed.  Patient did not have a significantly elevated lactic acid but it was above 2.  Patient's white blood cell count was not above 14,000.  The patient did receive 2.5 L of fluid.  CT scan of the abdomen was done for the persistent abdominal pain seem to be consistent with pyelonephritis.  Most likely the cause of the infection.  Her COVID testing was negative.  Gust with hospitalist and they will admit.   Fredia Sorrow, MD 02/02/19 2157

## 2019-02-02 NOTE — ED Notes (Signed)
ED TO INPATIENT HANDOFF REPORT  ED Nurse Name and Phone #: Leafy Kindle [phone]  S Name/Age/Gender Terri [name] 43 y.o. female Room/Bed: WA18/WA18  Code Status   Code Status: Prior  Home/SNF/Other Home Patient oriented to: self, place, time and situation Is this baseline? Yes   Triage Complete: Triage complete  Chief Complaint shob fever cough  Triage Note Patient arrived by EMS from home. Pt c/o SOB, Cough, and body aches x 3 days.   Pt has not been tested for COVID-19 per EMS.   Pt placed on 2L [place] for comfort.   Uses inhaler but was unable to state why she uses it.    Allergies Allergies  Allergen Reactions  . Ketorolac Tromethamine Itching and Nausea And Vomiting  . Methocarbamol Diarrhea    Level of Care/Admitting Diagnosis ED Disposition    ED Disposition Condition [facility] Area: [place] [[id number]]  Level of Care: Telemetry [5]  Admit to tele based on following criteria: Other see comments  Comments: sepsis  Covid Evaluation: Confirmed COVID Negative  [facility]: [id number]  Admitting Physician: Elwyn Reach [[id number]]  Attending Physician: Elwyn Reach [[id number]]  Estimated length of stay: past midnight tomorrow  Certification:: I certify this patient will need inpatient services for at least 2 midnights  PT Class (Do Not Modify): Inpatient [101]  PT Acc Code (Do Not Modify): Private [1]       B Medical/Surgery History Past Medical History:  Diagnosis Date  . Arthritis    Back and legs  . Bipolar disorder ([place])   . Chronic back pain   . Depression   . DVT (deep venous thrombosis) (HCC)    early 20's leg  . History of blood transfusion   . Hypertension   . Migraine headache   . Neuropathy   . Pneumonia   . 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 Sciatica   . Stress incontinence    Past Surgical History:  Procedure Laterality Date  . ALVEOLOPLASTY Bilateral 01/31/2018    Procedure: ALVEOLOPLASTY;  Surgeon: Diona Browner, DDS;  Location: [place];  Service: Oral Surgery;  Laterality: Bilateral;  . ANKLE SURGERY Left 1992   with hardware  . BACK SURGERY    . CESAREAN SECTION    . CHOLECYSTECTOMY N/A 02/15/2017   Procedure: LAPAROSCOPIC CHOLECYSTECTOMY;  Surgeon: Clovis Riley, MD;  Location: [facility];  Service: General;  Laterality: N/A;  . ESOPHAGOGASTRODUODENOSCOPY (EGD) WITH PROPOFOL N/A 09/15/2017   Procedure: ESOPHAGOGASTRODUODENOSCOPY (EGD) WITH PROPOFOL;  Surgeon: Clarene Essex, MD;  Location[facility];  Service: Endoscopy;  Laterality: N/A;  . GASTRIC BYPASS    . INCISION AND DRAINAGE OF PERITONSILLAR ABCESS Left 08/13/2017   Procedure: INCISION AND DRAINAGE OF LEFT NECK ABSCESS;  Surgeon: Melida Quitter, MD;  Location[facility];  Service: ENT;  Laterality: Left;  . LAPAROSCOPIC LYSIS OF ADHESIONS  02/15/2017   Procedure: LAPAROSCOPIC LYSIS OF ADHESIONS;  Surgeon: Clovis Riley, MD;  Location[facility];  Service: General;;  . LUMBAR [place]    . LUMBAR FUSION    . TOOTH EXTRACTION Bilateral 01/31/2018   Procedure: DENTAL RESTORATION/EXTRACTIONS;  Surgeon: Diona Browner, DDS;  Location: [place];  Service: Oral Surgery;  Laterality: Bilateral;  . TUBAL LIGATION    . UPPER GI ENDOSCOPY N/A 02/15/2017   Procedure: UPPER GI ENDOSCOPY;  Surgeon: Clovis Riley, MD;  Location[facility];  Service: General;  Laterality:  N/A;     A IV Location/Drains/Wounds Patient Lines/Drains/Airways Status   Active Line/Drains/Airways    Name:   Placement date:   Placement time:   Site:   Days:   Peripheral IV 02/02/19 Left Forearm   02/02/19    1530    Forearm   less than 1   Peripheral IV 02/02/19 Right Antecubital   02/02/19    1740    Antecubital   less than 1   Open Drain 1 Left Neck    08/13/17    0925    Neck   538   Incision (Closed) 08/13/17 Neck Other (Comment)   08/13/17    0934     538   Incision (Closed) 01/31/18 Other (Comment) Other (Comment)   01/31/18     0840     367   Wound / Incision (Open or Dehisced) 09/13/17 Non-pressure wound Hand Anterior;Right;Other (Comment) right dorsal   09/13/17    2035    Hand   507          Intake/Output Last 24 hours  Intake/Output Summary (Last 24 hours) at 02/02/2019 2304 Last data filed at 02/02/2019 2211 Gross per 24 hour  Intake 3485.04 ml  Output -  Net 3485.04 ml    Labs/Imaging Results for orders placed or performed during the hospital encounter of 02/02/19 (from the past 48 hour(s))  Comprehensive metabolic panel     Status: Abnormal   Collection Time: 02/02/19  3:10 PM  Result Value Ref Range   Sodium 136 135 - 145 mmol/L   Potassium 3.4 (L) 3.5 - 5.1 mmol/L   Chloride 99 98 - 111 mmol/L   CO2 23 22 - 32 mmol/L   Glucose, Bld 53 (L) 70 - 99 mg/dL   BUN 30 (H) 6 - 20 mg/dL   Creatinine, Ser 1.70 (H) 0.44 - 1.00 mg/dL   Calcium 7.8 (L) 8.9 - 10.3 mg/dL   Total Protein 5.7 (L) 6.5 - 8.1 g/dL   Albumin 2.5 (L) 3.5 - 5.0 g/dL   AST 33 15 - 41 U/L   ALT 18 0 - 44 U/L   Alkaline Phosphatase 117 38 - 126 U/L   Total Bilirubin 1.9 (H) 0.3 - 1.2 mg/dL   GFR calc non Af Amer 36 (L) >60 mL/min   GFR calc Af Amer 41 (L) >60 mL/min   Anion gap 14 5 - 15    Comment: Performed at [facility], [place] [address], [place], [place] [address]  Lactic acid, plasma     Status: Abnormal   Collection Time: 02/02/19  3:10 PM  Result Value Ref Range   Lactic Acid, Venous 2.8 (HH) 0.5 - 1.9 mmol/L    Comment: CRITICAL RESULT CALLED TO, READ BACK BY AND VERIFIED WITH: MAUNEY,J RN @1644  ON 02/02/2019 JACKSON,K Performed at [name], [place] [address] [place],  [address]   Urinalysis, Routine w reflex microscopic     Status: Abnormal   Collection Time: 02/02/19  3:10 PM  Result Value Ref Range   Color, Urine YELLOW YELLOW   APPearance CLOUDY (A) CLEAR   Specific Gravity, Urine 1.011 1.005 - 1.030   pH 5.0 5.0 - 8.0   Glucose, UA NEGATIVE NEGATIVE mg/dL   Hgb  urine dipstick LARGE (A) NEGATIVE   Bilirubin Urine NEGATIVE NEGATIVE   Ketones, ur NEGATIVE NEGATIVE mg/dL   Protein, ur 100 (A) NEGATIVE mg/dL   Nitrite NEGATIVE NEGATIVE   Leukocytes,Ua LARGE (A) NEGATIVE  RBC / HPF 21-50 0 - 5 RBC/hpf   WBC, UA >50 (H) 0 - 5 WBC/hpf   Bacteria, UA MANY (A) NONE SEEN   Squamous Epithelial / LPF 21-50 0 - 5   WBC Clumps PRESENT    Hyaline Casts, UA PRESENT     Comment: Performed at [facility], [place] [address], [place], [place] [address]  SARS Coronavirus 2 [facility] order, Performed in [facility] hospital lab) Nasopharyngeal Nasopharyngeal Swab     Status: None   Collection Time: 02/02/19  3:10 PM   Specimen: Nasopharyngeal Swab  Result Value Ref Range   SARS Coronavirus 2 NEGATIVE NEGATIVE    Comment: (NOTE) If result is NEGATIVE SARS-CoV-2 target nucleic acids are NOT DETECTED. The SARS-CoV-2 RNA is generally detectable in upper and lower  respiratory specimens during the acute phase of infection. The lowest  concentration of SARS-CoV-2 viral copies this assay can detect is 250  copies / mL. A negative result does not preclude SARS-CoV-2 infection  and should not be used as the sole basis for treatment or other  patient management decisions.  A negative result may occur with  improper specimen collection / handling, submission of specimen other  than nasopharyngeal swab, presence of viral mutation(s) within the  areas targeted by this assay, and inadequate number of viral copies  (<250 copies / mL). A negative result must be combined with clinical  observations, patient history, and epidemiological information. If result is POSITIVE SARS-CoV-2 target nucleic acids are DETECTED. The SARS-CoV-2 RNA is generally detectable in upper and lower  respiratory specimens dur ing the acute phase of infection.  Positive  results are indicative of active infection with SARS-CoV-2.  Clinical  correlation with patient history and  other diagnostic information is  necessary to determine patient infection status.  Positive results do  not rule out bacterial infection or co-infection with other viruses. If result is PRESUMPTIVE POSTIVE SARS-CoV-2 nucleic acids MAY BE PRESENT.   A presumptive positive result was obtained on the submitted specimen  and confirmed on repeat testing.  While 2019 novel coronavirus  (SARS-CoV-2) nucleic acids may be present in the submitted sample  additional confirmatory testing may be necessary for epidemiological  and / or clinical management purposes  to differentiate between  SARS-CoV-2 and other Sarbecovirus currently known to infect humans.  If clinically indicated additional testing with an alternate test  methodology [phone]) is advised. The SARS-CoV-2 RNA is generally  detectable in upper and lower respiratory sp ecimens during the acute  phase of infection. The expected result is Negative. Fact Sheet for Patients:  StrictlyIdeas.no Fact Sheet for Healthcare Providers: BankingDealers.co.za This test is not yet approved or cleared by the [place] FDA and has been authorized for detection and/or diagnosis of SARS-CoV-2 by FDA under an Emergency Use Authorization (EUA).  This EUA will remain in effect (meaning this test can be used) for the duration of the COVID-19 declaration under Section 564(b)(1) of the Act, 21 U.S.C. section 360bbb-3(b)(1), unless the authorization is terminated or revoked sooner. Performed at [facility], [place] [address] [place], [place] [address]   POC urine preg, ED     Status: None   Collection Time: 02/02/19  3:33 PM  Result Value Ref Range   Preg Test, Ur NEGATIVE NEGATIVE    Comment:        THE SENSITIVITY OF THIS METHODOLOGY IS >24 mIU/mL   Lactic acid, plasma     Status: None  Collection Time: 02/02/19  5:53 PM  Result Value Ref Range   Lactic Acid, Venous 1.7 0.5 - 1.9  mmol/L    Comment: Performed at [facility], [place] [address] [place], [place] [address]  CBC WITH DIFFERENTIAL     Status: Abnormal   Collection Time: 02/02/19  5:53 PM  Result Value Ref Range   WBC 12.6 (H) 4.0 - 10.5 K/uL   RBC 4.08 3.87 - 5.11 MIL/uL   Hemoglobin 9.0 (L) 12.0 - 15.0 g/dL   HCT 27.5 (L) 36.0 - 46.0 %   MCV 67.4 (L) 80.0 - 100.0 fL   MCH 22.1 (L) 26.0 - 34.0 pg   MCHC 32.7 30.0 - 36.0 g/dL   RDW 18.2 (H) 11.5 - 15.5 %   Platelets 119 (L) 150 - 400 K/uL    Comment: Immature Platelet Fraction may be clinically indicated, consider ordering this additional test [id number]    nRBC 0.0 0.0 - 0.2 %   Neutrophils Relative % 90 %   Neutro Abs 11.3 (H) 1.7 - 7.7 K/uL   Lymphocytes Relative 3 %   Lymphs Abs 0.4 (L) 0.7 - 4.0 K/uL   Monocytes Relative 6 %   Monocytes Absolute 0.8 0.1 - 1.0 K/uL   Eosinophils Relative 1 %   Eosinophils Absolute 0.1 0.0 - 0.5 K/uL   Basophils Relative 0 %   Basophils Absolute 0.1 0.0 - 0.1 K/uL   Immature Granulocytes 0 %   Abs Immature Granulocytes 0.02 0.00 - 0.07 K/uL    Comment: Performed at [facility], [place] [address], [place], [place] [address]  APTT     Status: None   Collection Time: 02/02/19  5:53 PM  Result Value Ref Range   aPTT 34 24 - 36 seconds    Comment: Performed at [facility], [place] [address] [place], [place] [address]  Protime-INR     Status: None   Collection Time: 02/02/19  5:53 PM  Result Value Ref Range   Prothrombin Time 14.5 11.4 - 15.2 seconds   INR 1.1 0.8 - 1.2    Comment: (NOTE) INR goal varies based on device and disease states. Performed at [facility], [place] [address] [place], [place] [address]   Lactic acid, plasma     Status: None   Collection Time: 02/02/19  6:48 PM  Result Value Ref Range   Lactic Acid, Venous 1.6 0.5 - 1.9 mmol/L    Comment: Performed at [facility], [place] [address],  [place],  [address]   Ct Abdomen Pelvis Wo Contrast  Result Date: 02/02/2019 CLINICAL DATA:  Shortness of breath, cough and body aches for 3 days, septic with abdominal and back pain EXAM: CT ABDOMEN AND PELVIS WITHOUT CONTRAST TECHNIQUE: Multidetector CT imaging of the abdomen and pelvis was performed following the standard protocol without IV contrast. COMPARISON:  CT 04/06/2018 FINDINGS: Lower chest: Bandlike areas of basilar atelectasis and/or scarring. Normal heart size. No pericardial effusion. Hepatobiliary: No focal liver abnormality is seen. Patient is post cholecystectomy. Slight prominence of the biliary tree likely related to reservoir effect. No calcified intraductal gallstones. Pancreas: Unremarkable. No pancreatic ductal dilatation or surrounding inflammatory changes. Spleen: Normal in size without focal abnormality. Adrenals/Urinary Tract: Normal adrenal glands. There is bilateral renal enlargement with a heterogeneous attenuation and significant surrounding stranding. No hydronephrosis or obstructive urolithiasis. No visible or contour deforming renal lesions. Urinary bladder is circumferentially thickened. Stomach/Bowel: Patient is post gastric bypass with  continued decrease in the amount of material within the excluded portion of the gastric pouch. There is focal dilatation of a small bowel loop adjacent the distal small bowel anastomosis up to 4.3 cm however the adjacent bowel loops are more normal caliber and there is passage of material beyond the level of the anastomosis. No other small bowel dilatation or mural thickening. A normal appendix is visualized. No colonic dilatation or wall thickening. Vascular/Lymphatic: Atherosclerotic plaque within the normal caliber aorta. Further evaluation the vessels is limited by absence of contrast. No suspicious or enlarged lymph nodes in the included lymphatic chains. Reproductive: Normal appearance of the uterus and adnexal structures. Bilateral  ligation clips are noted. Other: Extensive perinephric stranding, as above. No free fluid or free gas. No bowel containing hernia. Postsurgical changes are seen in the upper anterior midline abdomen. Musculoskeletal: Multilevel degenerative changes are present in the imaged portions of the spine. Features are most pronounced at L3-4 and L4-5 levels. Postsurgical changes from prior posterior spinal decompression. Partial fragmentation of the L3 spinous process. IMPRESSION: 1. Bilateral renal enlargement with a heterogeneous attenuation and significant surrounding stranding with circumferentially thickened bladder wall suggesting an ascending urinary tract infection with pyelonephritis. 2. Focal dilatation of a small bowel loop adjacent the distal small bowel anastomosis however the adjacent bowel loops are more normal caliber and there is passage of material beyond the level of the anastomosis. Findings could reflect normal postsurgical denervation of the bowel loop, focal ileus or partial obstruction. Correlation with symptoms is recommended. 3. Aortic Atherosclerosis (ICD10-I70.0). Electronically Signed   By: Lovena Le M.D.   On: 02/02/2019 20:04   Dg Chest Port 1 View  Result Date: 02/02/2019 CLINICAL DATA:  43 year old female with shortness of breath and cough. EXAM: PORTABLE CHEST 1 VIEW COMPARISON:  Chest radiograph dated 09/13/2017 FINDINGS: The lungs are clear. There is no pleural effusion or pneumothorax. Stable cardiac silhouette. No acute osseous pathology. The IMPRESSION: No active disease. Electronically Signed   By: Anner Crete M.D.   On: 02/02/2019 16:16    Pending Labs Unresulted Labs (From admission, onward)    Start     Ordered   02/02/19 1648  Blood Culture (routine x 2)  BLOOD CULTURE X 2,   STAT     02/02/19 1649   02/02/19 1636  Urine Culture  ONCE - STAT,   STAT     02/02/19 1636   02/02/19 1345  CBC with Differential  Once,   STAT     02/02/19 1345           Vitals/Pain Today's Vitals   02/02/19 2116 02/02/19 2156 02/02/19 2200 02/02/19 2248  BP: (!) 84/49 (!) 86/56 (!) 88/58 (!) 101/58  Pulse: (!) 101 99 99 95  Resp: (!) 31 (!) 26 (!) 23 (!) 24  Temp:      TempSrc:  Oral    SpO2: 98% 94% 96% 97%  Weight:      Height:      PainSc:        Isolation Precautions No active isolations  Medications Medications  0.9 %  sodium chloride infusion (has no administration in time range)  acetaminophen (TYLENOL) tablet 1,000 mg (1,000 mg Oral Given 02/02/19 1512)  ondansetron (ZOFRAN) injection 4 mg (4 mg Intravenous Given 02/02/19 1530)  sodium chloride 0.9 % bolus 500 mL (0 mLs Intravenous Stopped 02/02/19 1657)  ceFEPIme (MAXIPIME) 2 g in sodium chloride 0.9 % 100 mL IVPB (0 g Intravenous Stopped 02/02/19 1837)  metroNIDAZOLE (FLAGYL) IVPB 500 mg (0 mg Intravenous Stopped 02/02/19 2126)  sodium chloride 0.9 % bolus 1,000 mL (0 mLs Intravenous Stopped 02/02/19 2126)    And  sodium chloride 0.9 % bolus 1,000 mL (0 mLs Intravenous Stopped 02/02/19 2011)    And  sodium chloride 0.9 % bolus 500 mL (0 mLs Intravenous Stopped 02/02/19 2211)  vancomycin (VANCOCIN) 1,750 mg in sodium chloride 0.9 % 500 mL IVPB (0 mg Intravenous Stopped 02/02/19 2011)  fentaNYL (SUBLIMAZE) injection 25 mcg (25 mcg Intravenous Given 02/02/19 1815)    Mobility walks Low fall risk     R Recommendations: See Admitting Provider Note  Report given to: Caryn Bee, RN

## 2019-02-02 NOTE — ED Notes (Signed)
[phone] 

## 2019-02-02 NOTE — Progress Notes (Signed)
Pharmacy Antibiotic Note  Terri [name] is a 43 y.o. female admitted on 02/02/2019 with sepsis.  Pharmacy has been consulted for Vancomycin, cefepime dosing.  Plan: Vancomycin 1.75gm iv x1(1909), then Vancomycin 1250 mg IV Q 24 hrs. Goal AUC 400-550. Expected AUC: 554 SCr used: 1.7  Cefepime 2gm iv x1, then 2gm iv q12hr  Height: 5\' 9"  (175.3 cm) Weight: 170 lb (77.1 kg) IBW/kg (Calculated) : 66.2  Temp (24hrs), Avg:99.6 F (37.6 C), Min:99.2 F (37.3 C), Max:99.9 F (37.7 C)  Recent Labs  Lab 02/02/19 1510 02/02/19 1753 02/02/19 1848  WBC  --  12.6*  --   CREATININE 1.70*  --   --   LATICACIDVEN 2.8* 1.7 1.6    Estimated Creatinine Clearance: 43.7 mL/min (A) (by C-G formula based on SCr of 1.7 mg/dL (H)).    Allergies  Allergen Reactions  . Ketorolac Tromethamine Itching and Nausea And Vomiting  . Methocarbamol Diarrhea    Antimicrobials this admission: Vancomycin 02/02/2019 >> Cefepime 02/02/2019 >>   Dose adjustments this admission: -  Microbiology results: -  Thank you for allowing pharmacy to be a part of this patient's care.  Nani Skillern Crowford 02/02/2019 11:22 PM

## 2019-02-02 NOTE — ED Notes (Signed)
Gave report to [employer] 4E

## 2019-02-02 NOTE — ED Provider Notes (Signed)
[facility] DEPT Provider Note   CSN: [id number] Arrival date & time: 02/02/19  1239     History   Chief Complaint Chief Complaint  Patient presents with  . Shortness of Breath  . Weakness    HPI Terri [name] is a 43 y.o. female.     43 year old female with past medical history below including bipolar disorder, chronic back pain, hypertension who presents with cough, shortness of breath.  4 days ago, she began having cough associated with shortness of breath, body aches, chills, headaches, vomiting, and diarrhea.  She denies any sick contacts.  She has taken NyQuil and Tylenol for her symptoms but no medications today.  She was placed on 2 L nasal cannula by EMS.  The history is provided by the patient.  Shortness of Breath Weakness Associated symptoms: shortness of breath     Past Medical History:  Diagnosis Date  . Arthritis    Back and legs  . Bipolar disorder ([place])   . Chronic back pain   . Depression   . DVT (deep venous thrombosis) ([facility])    early 20's leg  . History of blood transfusion   . Hypertension   . Migraine headache   . Neuropathy   . Pneumonia   . 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 Sciatica   . Stress incontinence     Patient Active Problem List   Diagnosis Date Noted  . GIB (gastrointestinal bleeding) 09/13/2017  . Apnea 09/13/2017  . Essential hypertension   . Cellulitis and abscess of neck 08/12/2017  . Cellulitis of submandibular region 08/10/2017  . Odontogenic infection of jaw 08/10/2017  . Cholecystitis 02/14/2017  . Opioid abuse with opioid-induced mood disorder ([place]) 01/25/2017  . Anxiety 02/11/2014  . Depression 07/12/2012    Past Surgical History:  Procedure Laterality Date  . ALVEOLOPLASTY Bilateral 01/31/2018   Procedure: ALVEOLOPLASTY;  Surgeon: Diona Browner, DDS;  Location: [facility];  Service: Oral Surgery;  Laterality: Bilateral;  . ANKLE SURGERY Left 1992   with hardware  . BACK  SURGERY    . CESAREAN SECTION    . CHOLECYSTECTOMY N/A 02/15/2017   Procedure: LAPAROSCOPIC CHOLECYSTECTOMY;  Surgeon: Clovis Riley, MD;  Location: [facility];  Service: General;  Laterality: N/A;  . ESOPHAGOGASTRODUODENOSCOPY (EGD) WITH PROPOFOL N/A 09/15/2017   Procedure: ESOPHAGOGASTRODUODENOSCOPY (EGD) WITH PROPOFOL;  Surgeon: Clarene Essex, MD;  Location: [facility];  Service: Endoscopy;  Laterality: N/A;  . GASTRIC BYPASS    . INCISION AND DRAINAGE OF PERITONSILLAR ABCESS Left 08/13/2017   Procedure: INCISION AND DRAINAGE OF LEFT NECK ABSCESS;  Surgeon: Melida Quitter, MD;  Location: [facility];  Service: ENT;  Laterality: Left;  . LAPAROSCOPIC LYSIS OF ADHESIONS  02/15/2017   Procedure: LAPAROSCOPIC LYSIS OF ADHESIONS;  Surgeon: Clovis Riley, MD;  Location: [facility];  Service: General;;  . LUMBAR [place]    . LUMBAR FUSION    . TOOTH EXTRACTION Bilateral 01/31/2018   Procedure: DENTAL RESTORATION/EXTRACTIONS;  Surgeon: Diona Browner, DDS;  Location: [facility];  Service: Oral Surgery;  Laterality: Bilateral;  . TUBAL LIGATION    . UPPER GI ENDOSCOPY N/A 02/15/2017   Procedure: UPPER GI ENDOSCOPY;  Surgeon: Clovis Riley, MD;  Location: [facility];  Service: General;  Laterality: N/A;     OB History   No obstetric history on file.      Home Medications    Prior to Admission medications   Medication Sig  Start Date End Date Taking? Authorizing Provider  acetaminophen (TYLENOL) 500 MG tablet Take 1 tablet (500 mg total) by mouth every 6 (six) hours as needed for moderate pain. Patient not taking: Reported on 05/17/2018 01/31/18   Diona Browner, DDS  albuterol (PROVENTIL HFA;VENTOLIN HFA) 108 (90 BASE) MCG/ACT inhaler Inhale 1-2 puffs into the lungs every 4 (four) hours as needed for wheezing or shortness of breath. 03/07/15   Pisciotta, Elmyra Ricks, PA-C  amoxicillin (AMOXIL) 500 MG capsule Take 1 capsule (500 mg total) by mouth 3 (three) times daily. Patient not taking: Reported on  04/02/2018 01/31/18   Diona Browner, DDS  ibuprofen (ADVIL,MOTRIN) 200 MG tablet Take 200 mg by mouth every 6 (six) hours as needed for moderate pain.    [provider]  lisinopril-hydrochlorothiazide (PRINZIDE,ZESTORETIC) 10-12.5 MG tablet Take 1 tablet by mouth daily. 01/22/17   [provider]  oxyCODONE-acetaminophen (PERCOCET) 5-325 MG tablet Take 1 tablet by mouth every 4 (four) hours as needed. Patient not taking: Reported on 04/02/2018 01/31/18   Diona Browner, DDS  pantoprazole (PROTONIX) 40 MG tablet Take 1 tablet (40 mg total) by mouth daily. 09/15/17 04/02/18  Arrien, Jimmy Picket, MD  polyethylene glycol [facility]) packet Take 17 g by mouth daily. Patient not taking: Reported on 05/17/2018 04/02/18   Hayden Rasmussen, MD  traMADol (ULTRAM) 50 MG tablet Take 1 tablet (50 mg total) by mouth every 6 (six) hours as needed. Patient not taking: Reported on 04/02/2018 10/11/17   Dorie Rank, MD    Family History Family History  Problem Relation Age of Onset  . Diabetes Mother   . Hypertension Mother   . Diabetes Father   . Hypertension Father     Social History Social History   Tobacco Use  . Smoking status: Current Every Day Smoker    Packs/day: 1.00    Years: 29.00    Pack years: 29.00    Types: Cigarettes  . Smokeless tobacco: Never Used  Substance Use Topics  . Alcohol use: No  . Drug use: Not Currently    Types: IV    Comment: Heroin- last time 05/2017     Allergies   Ketorolac tromethamine and Methocarbamol   Review of Systems Review of Systems  Respiratory: Positive for shortness of breath.   Neurological: Positive for weakness.   All other systems reviewed and are negative except that which was mentioned in HPI  Physical Exam Updated Vital Signs BP 104/70   Pulse (!) 103   Temp 99.2 F (37.3 C) (Oral)   Resp (!) 23   Ht 5\' 9"  (1.753 m)   Wt 77.1 kg   LMP 09/22/2018 (Approximate) Comment: Pt is sexual active but has tubes tied   SpO2 96%   BMI 25.10 kg/m   Physical Exam Vitals signs and nursing note reviewed.  Constitutional:      Appearance: She is well-developed.  HENT:     Head: Normocephalic and atraumatic.  Eyes:     Conjunctiva/sclera: Conjunctivae normal.  Neck:     Musculoskeletal: Neck supple.  Cardiovascular:     Rate and Rhythm: Regular rhythm. Tachycardia present.     Heart sounds: Normal heart sounds. No murmur.  Pulmonary:     Breath sounds: No wheezing.     Comments: Tachypnea without severe respiratory distress Abdominal:     General: Bowel sounds are normal. There is no distension.     Palpations: Abdomen is soft.     Tenderness: There is abdominal tenderness (generalized).  Skin:    General: Skin is warm and dry.  Neurological:     Mental Status: She is alert and oriented to person, place, and time.     Comments: Fluent speech  Psychiatric:        Mood and Affect: Mood is anxious.        Judgment: Judgment normal.     Comments: Distressed, anxious      ED Treatments / Results  Labs (all labs ordered are listed, but only abnormal results are displayed) Labs Reviewed  SARS CORONAVIRUS 2 ([facility])  COMPREHENSIVE METABOLIC PANEL  CBC WITH DIFFERENTIAL/PLATELET  LACTIC ACID, PLASMA  LACTIC ACID, PLASMA  URINALYSIS, ROUTINE W REFLEX MICROSCOPIC  POC URINE PREG, ED    EKG EKG Interpretation  Date/Time:  Friday February 02 2019 12:47:43 EDT Ventricular Rate:  104 PR Interval:    QRS Duration: 85 QT Interval:  328 QTC Calculation: 432 R Axis:   53 Text Interpretation:  Sinus tachycardia Artifact in lead(s) I II III aVR aVL V1 V2 V3 V4 V5 V6 tachycardia new from previous Confirmed by Theotis Burrow [phone]) on 02/02/2019 2:13:37 PM   Radiology No results found.  Procedures Procedures (including critical care time)  Medications Ordered in ED Medications  acetaminophen (TYLENOL) tablet 1,000 mg (has no administration in  time range)  ondansetron (ZOFRAN) injection 4 mg (has no administration in time range)  sodium chloride 0.9 % bolus 500 mL (has no administration in time range)     Initial Impression / Assessment and Plan / ED Course  I have reviewed the triage vital signs and the nursing notes.  Pertinent labs & imaging results that were available during my care of the patient were reviewed by me and considered in my medical decision making (see chart for details).       Pt anxious and uncomfortable on exam, T 99.2, HR low 100s, BP stable, O2 sat 96% on 2L. No respiratory distress. I have ordered labs including COVID-19 testing as well as chest X-ray. I am signing out to the oncoming provider pending results.   QIANNA KARCHER was evaluated in Emergency Department on 02/02/2019 for the symptoms described in the history of present illness. She was evaluated in the context of the global COVID-19 pandemic, which necessitated consideration that the patient might be at risk for infection with the SARS-CoV-2 virus that causes COVID-19. Institutional protocols and algorithms that pertain to the evaluation of patients at risk for COVID-19 are in a state of rapid change based on information released by regulatory bodies including the CDC and federal and state organizations. These policies and algorithms were followed during the patient's care in the ED.   Final Clinical Impressions(s) / ED Diagnoses   Final diagnoses:  None    ED Discharge Orders    None       Tashona Calk, Wenda Overland, MD 02/02/19 1511

## 2019-02-02 NOTE — H&P (Signed)
History and Physical   Terri [name] [id number] DOB: 02/20/1976 DOA: 02/02/2019  Referring MD/NP/PA: Dr. Rogene Houston  PCP: [name], MD   Outpatient Specialists: None  Patient coming from: Home  Chief Complaint: Shortness of breath and general weakness  HPI: Terri [name] is a 43 y.o. female with medical history significant of depression, bipolar disorder, chronic back pain, hypertension, previous DVT, migraine headaches and previous pneumonia who presented to the ER with complaint of shortness of breath cough generalized weakness.  Patient was seen and evaluated.  She has had some chills headaches with vomiting and diarrhea over the last 4 days.  She has been taking Tylenol and NyQuil but no response.  Patient was found to be mildly hypoxic on arrival and placed on oxygen.  She had elevated lactic acid and symptoms suggestive of sepsis.  Chest x-ray was not revealing.  Urinalysis suggested UTI as possible cause of her sepsis.  Patient is being admitted to the hospital for work-up..  ED Course: Temperature is 99.9 blood pressure 81/52 pulse 115 respirate of 32 oxygen sat 91% on 2 L.  White count is 12.6 hemoglobin 9.1 platelets 119.  Potassium 3.4 creatinine 1.7 BUN 30 and calcium of 7.8.  Lactic acid 2.8.  Urinalysis showed cloudy urine with large leukocytes.  Many bacteria WBC 21-50.  Chest x-ray showed no active disease.  CT abdomen pelvis showed bilateral renal enlargement with heterogeneous attenuation.  Consistent with pyelonephritis.  Patient is being admitted for treatment and early sepsis.  Review of Systems: As per HPI otherwise 10 point review of systems negative.    Past Medical History:  Diagnosis Date  . Arthritis    Back and legs  . Bipolar disorder ([place])   . Chronic back pain   . Depression   . DVT (deep venous thrombosis) (HCC)    early 20's leg  . History of blood transfusion   . Hypertension   . Migraine headache   . Neuropathy   . Pneumonia   . Positive  PPD    x 2 last time 09/2017  . Sciatica   . Stress incontinence     Past Surgical History:  Procedure Laterality Date  . ALVEOLOPLASTY Bilateral 01/31/2018   Procedure: ALVEOLOPLASTY;  Surgeon: Diona Browner, DDS;  Location: [facility];  Service: Oral Surgery;  Laterality: Bilateral;  . ANKLE SURGERY Left 1992   with hardware  . BACK SURGERY    . CESAREAN SECTION    . CHOLECYSTECTOMY N/A 02/15/2017   Procedure: LAPAROSCOPIC CHOLECYSTECTOMY;  Surgeon: Clovis Riley, MD;  Location: [facility];  Service: General;  Laterality: N/A;  . ESOPHAGOGASTRODUODENOSCOPY (EGD) WITH PROPOFOL N/A 09/15/2017   Procedure: ESOPHAGOGASTRODUODENOSCOPY (EGD) WITH PROPOFOL;  Surgeon: Clarene Essex, MD;  Location: [facility];  Service: Endoscopy;  Laterality: N/A;  . GASTRIC BYPASS    . INCISION AND DRAINAGE OF PERITONSILLAR ABCESS Left 08/13/2017   Procedure: INCISION AND DRAINAGE OF LEFT NECK ABSCESS;  Surgeon: Melida Quitter, MD;  Location: [facility];  Service: ENT;  Laterality: Left;  . LAPAROSCOPIC LYSIS OF ADHESIONS  02/15/2017   Procedure: LAPAROSCOPIC LYSIS OF ADHESIONS;  Surgeon: Clovis Riley, MD;  Location: [facility];  Service: General;;  . LUMBAR [place]    . LUMBAR FUSION    . TOOTH EXTRACTION Bilateral 01/31/2018   Procedure: DENTAL RESTORATION/EXTRACTIONS;  Surgeon: Diona Browner, DDS;  Location: [facility];  Service: Oral Surgery;  Laterality: Bilateral;  . TUBAL LIGATION    . UPPER GI ENDOSCOPY N/A 02/15/2017  Procedure: UPPER GI ENDOSCOPY;  Surgeon: Clovis Riley, MD;  Location: [facility];  Service: General;  Laterality: N/A;     reports that she has been smoking cigarettes. She has a 29.00 pack-year smoking history. She has never used smokeless tobacco. She reports previous drug use. Drug: IV. She reports that she does not drink alcohol.  Allergies  Allergen Reactions  . Ketorolac Tromethamine Itching and Nausea And Vomiting  . Methocarbamol Diarrhea    Family History  Problem Relation Age of  Onset  . Diabetes Mother   . Hypertension Mother   . Diabetes Father   . Hypertension Father      Prior to Admission medications   Medication Sig Start Date End Date Taking? Authorizing Provider  albuterol (PROVENTIL HFA;VENTOLIN HFA) 108 (90 BASE) MCG/ACT inhaler Inhale 1-2 puffs into the lungs every 4 (four) hours as needed for wheezing or shortness of breath. 03/07/15  Yes Pisciotta, Elmyra Ricks, PA-C  amitriptyline (ELAVIL) 75 MG tablet Take 75 mg by mouth at bedtime.   Yes [provider]  gabapentin (NEURONTIN) 300 MG capsule Take 300 mg by mouth 3 (three) times daily.   Yes [provider]  ibuprofen (ADVIL,MOTRIN) 200 MG tablet Take 200 mg by mouth every 6 (six) hours as needed for moderate pain.   Yes [provider]  lisinopril-hydrochlorothiazide (PRINZIDE,ZESTORETIC) 10-12.5 MG tablet Take 1 tablet by mouth daily. 01/22/17  Yes [provider]  acetaminophen (TYLENOL) 500 MG tablet Take 1 tablet (500 mg total) by mouth every 6 (six) hours as needed for moderate pain. Patient not taking: Reported on 05/17/2018 01/31/18   Diona Browner, DDS  amoxicillin (AMOXIL) 500 MG capsule Take 1 capsule (500 mg total) by mouth 3 (three) times daily. Patient not taking: Reported on 04/02/2018 01/31/18   Diona Browner, DDS  oxyCODONE-acetaminophen (PERCOCET) 5-325 MG tablet Take 1 tablet by mouth every 4 (four) hours as needed. Patient not taking: Reported on 04/02/2018 01/31/18   Diona Browner, DDS  pantoprazole (PROTONIX) 40 MG tablet Take 1 tablet (40 mg total) by mouth daily. 09/15/17 04/02/18  Arrien, Jimmy Picket, MD  polyethylene glycol [facility]) packet Take 17 g by mouth daily. Patient not taking: Reported on 05/17/2018 04/02/18   Hayden Rasmussen, MD  traMADol (ULTRAM) 50 MG tablet Take 1 tablet (50 mg total) by mouth every 6 (six) hours as needed. Patient not taking: Reported on 04/02/2018 10/11/17   Dorie Rank, MD    Physical Exam: Vitals:   02/02/19  2215 02/02/19 2245 02/02/19 2248 02/02/19 2300  BP: 109/68 (!) 101/58 (!) 101/58 98/63  Pulse: 98 92 95 94  Resp: (!) 27 (!) 26 (!) 24 (!) 26  Temp:      TempSrc:      SpO2: 95% 97% 97% 97%  Weight:      Height:          Constitutional: Acutely ill looking in obvious distress Vitals:   02/02/19 2215 02/02/19 2245 02/02/19 2248 02/02/19 2300  BP: 109/68 (!) 101/58 (!) 101/58 98/63  Pulse: 98 92 95 94  Resp: (!) 27 (!) 26 (!) 24 (!) 26  Temp:      TempSrc:      SpO2: 95% 97% 97% 97%  Weight:      Height:       Eyes: PERRL, lids and conjunctivae normal ENMT: Mucous membranes are dry. Posterior pharynx clear of any exudate or lesions.Normal dentition.  Neck: normal, supple, no masses, no thyromegaly Respiratory: clear to auscultation  bilaterally, no wheezing, no crackles.  Increased respiratory effort. No accessory muscle use.  Cardiovascular: Regular rate and rhythm, no murmurs / rubs / gallops. No extremity edema. 2+ pedal pulses. No carotid bruits.  Abdomen: Mild diffuse tenderness, CVA tenderness, no masses palpated. No hepatosplenomegaly. Bowel sounds positive.  Musculoskeletal: no clubbing / cyanosis. No joint deformity upper and lower extremities. Good ROM, no contractures. Normal muscle tone.  Skin: no rashes, lesions, ulcers. No induration Neurologic: CN 2-12 grossly intact. Sensation intact, DTR normal. Strength 5/5 in all 4.  Psychiatric: Normal judgment and insight. Alert and oriented x 3. Normal mood.     Labs on Admission: I have personally reviewed following labs and imaging studies  CBC: Recent Labs  Lab 02/02/19 1753  WBC 12.6*  NEUTROABS 11.3*  HGB 9.0*  HCT 27.5*  MCV 67.4*  PLT 123456*   Basic Metabolic Panel: Recent Labs  Lab 02/02/19 1510  NA 136  K 3.4*  CL 99  CO2 23  GLUCOSE 53*  BUN 30*  CREATININE 1.70*  CALCIUM 7.8*   GFR: Estimated Creatinine Clearance: 43.7 mL/min (A) (by C-G formula based on SCr of 1.7 mg/dL (H)). Liver  Function Tests: Recent Labs  Lab 02/02/19 1510  AST 33  ALT 18  ALKPHOS 117  BILITOT 1.9*  PROT 5.7*  ALBUMIN 2.5*   No results for input(s): LIPASE, AMYLASE in the last 168 hours. No results for input(s): AMMONIA in the last 168 hours. Coagulation Profile: Recent Labs  Lab 02/02/19 1753  INR 1.1   Cardiac Enzymes: No results for input(s): CKTOTAL, CKMB, CKMBINDEX, TROPONINI in the last 168 hours. BNP (last 3 results) No results for input(s): PROBNP in the last 8760 hours. HbA1C: No results for input(s): HGBA1C in the last 72 hours. CBG: No results for input(s): GLUCAP in the last 168 hours. Lipid Profile: No results for input(s): CHOL, HDL, LDLCALC, TRIG, CHOLHDL, LDLDIRECT in the last 72 hours. Thyroid Function Tests: No results for input(s): TSH, T4TOTAL, FREET4, T3FREE, THYROIDAB in the last 72 hours. Anemia Panel: No results for input(s): VITAMINB12, FOLATE, FERRITIN, TIBC, IRON, RETICCTPCT in the last 72 hours. Urine analysis:    Component Value Date/Time   COLORURINE YELLOW 02/02/2019 1510   APPEARANCEUR CLOUDY (A) 02/02/2019 1510   LABSPEC 1.011 02/02/2019 1510   PHURINE 5.0 02/02/2019 1510   GLUCOSEU NEGATIVE 02/02/2019 1510   HGBUR LARGE (A) 02/02/2019 1510   BILIRUBINUR NEGATIVE 02/02/2019 1510   KETONESUR NEGATIVE 02/02/2019 1510   PROTEINUR 100 (A) 02/02/2019 1510   UROBILINOGEN 1.0 02/09/2015 1228   NITRITE NEGATIVE 02/02/2019 1510   LEUKOCYTESUR LARGE (A) 02/02/2019 1510   Sepsis Labs: @LABRCNTIP (procalcitonin:4,lacticidven:4) ) Recent Results (from the past 240 hour(s))  SARS Coronavirus 2 [facility] order, Performed in [facility] hospital lab) Nasopharyngeal Nasopharyngeal Swab     Status: None   Collection Time: 02/02/19  3:10 PM   Specimen: Nasopharyngeal Swab  Result Value Ref Range Status   SARS Coronavirus 2 NEGATIVE NEGATIVE Final    Comment: (NOTE) If result is NEGATIVE SARS-CoV-2 target nucleic acids are NOT DETECTED. The  SARS-CoV-2 RNA is generally detectable in upper and lower  respiratory specimens during the acute phase of infection. The lowest  concentration of SARS-CoV-2 viral copies this assay can detect is 250  copies / mL. A negative result does not preclude SARS-CoV-2 infection  and should not be used as the sole basis for treatment or other  patient management decisions.  A negative result may occur with  improper  specimen collection / handling, submission of specimen other  than nasopharyngeal swab, presence of viral mutation(s) within the  areas targeted by this assay, and inadequate number of viral copies  (<250 copies / mL). A negative result must be combined with clinical  observations, patient history, and epidemiological information. If result is POSITIVE SARS-CoV-2 target nucleic acids are DETECTED. The SARS-CoV-2 RNA is generally detectable in upper and lower  respiratory specimens dur ing the acute phase of infection.  Positive  results are indicative of active infection with SARS-CoV-2.  Clinical  correlation with patient history and other diagnostic information is  necessary to determine patient infection status.  Positive results do  not rule out bacterial infection or co-infection with other viruses. If result is PRESUMPTIVE POSTIVE SARS-CoV-2 nucleic acids MAY BE PRESENT.   A presumptive positive result was obtained on the submitted specimen  and confirmed on repeat testing.  While 2019 novel coronavirus  (SARS-CoV-2) nucleic acids may be present in the submitted sample  additional confirmatory testing may be necessary for epidemiological  and / or clinical management purposes  to differentiate between  SARS-CoV-2 and other Sarbecovirus currently known to infect humans.  If clinically indicated additional testing with an alternate test  methodology [phone]) is advised. The SARS-CoV-2 RNA is generally  detectable in upper and lower respiratory sp ecimens during the acute   phase of infection. The expected result is Negative. Fact Sheet for Patients:  StrictlyIdeas.no Fact Sheet for Healthcare Providers: BankingDealers.co.za This test is not yet approved or cleared by the [place] FDA and has been authorized for detection and/or diagnosis of SARS-CoV-2 by FDA under an Emergency Use Authorization (EUA).  This EUA will remain in effect (meaning this test can be used) for the duration of the COVID-19 declaration under Section 564(b)(1) of the Act, 21 U.S.C. section 360bbb-3(b)(1), unless the authorization is terminated or revoked sooner. Performed at [facility], [place] [address] [place], [place] [address]      Radiological Exams on Admission: Ct Abdomen Pelvis Wo Contrast  Result Date: 02/02/2019 CLINICAL DATA:  Shortness of breath, cough and body aches for 3 days, septic with abdominal and back pain EXAM: CT ABDOMEN AND PELVIS WITHOUT CONTRAST TECHNIQUE: Multidetector CT imaging of the abdomen and pelvis was performed following the standard protocol without IV contrast. COMPARISON:  CT 04/06/2018 FINDINGS: Lower chest: Bandlike areas of basilar atelectasis and/or scarring. Normal heart size. No pericardial effusion. Hepatobiliary: No focal liver abnormality is seen. Patient is post cholecystectomy. Slight prominence of the biliary tree likely related to reservoir effect. No calcified intraductal gallstones. Pancreas: Unremarkable. No pancreatic ductal dilatation or surrounding inflammatory changes. Spleen: Normal in size without focal abnormality. Adrenals/Urinary Tract: Normal adrenal glands. There is bilateral renal enlargement with a heterogeneous attenuation and significant surrounding stranding. No hydronephrosis or obstructive urolithiasis. No visible or contour deforming renal lesions. Urinary bladder is circumferentially thickened. Stomach/Bowel: Patient is post gastric bypass with  continued decrease in the amount of material within the excluded portion of the gastric pouch. There is focal dilatation of a small bowel loop adjacent the distal small bowel anastomosis up to 4.3 cm however the adjacent bowel loops are more normal caliber and there is passage of material beyond the level of the anastomosis. No other small bowel dilatation or mural thickening. A normal appendix is visualized. No colonic dilatation or wall thickening. Vascular/Lymphatic: Atherosclerotic plaque within the normal caliber aorta. Further evaluation the vessels is limited by absence of contrast. No suspicious or enlarged  lymph nodes in the included lymphatic chains. Reproductive: Normal appearance of the uterus and adnexal structures. Bilateral ligation clips are noted. Other: Extensive perinephric stranding, as above. No free fluid or free gas. No bowel containing hernia. Postsurgical changes are seen in the upper anterior midline abdomen. Musculoskeletal: Multilevel degenerative changes are present in the imaged portions of the spine. Features are most pronounced at L3-4 and L4-5 levels. Postsurgical changes from prior posterior spinal decompression. Partial fragmentation of the L3 spinous process. IMPRESSION: 1. Bilateral renal enlargement with a heterogeneous attenuation and significant surrounding stranding with circumferentially thickened bladder wall suggesting an ascending urinary tract infection with pyelonephritis. 2. Focal dilatation of a small bowel loop adjacent the distal small bowel anastomosis however the adjacent bowel loops are more normal caliber and there is passage of material beyond the level of the anastomosis. Findings could reflect normal postsurgical denervation of the bowel loop, focal ileus or partial obstruction. Correlation with symptoms is recommended. 3. Aortic Atherosclerosis (ICD10-I70.0). Electronically Signed   By: Lovena Le M.D.   On: 02/02/2019 20:04   Dg Chest Port 1  View  Result Date: 02/02/2019 CLINICAL DATA:  43 year old female with shortness of breath and cough. EXAM: PORTABLE CHEST 1 VIEW COMPARISON:  Chest radiograph dated 09/13/2017 FINDINGS: The lungs are clear. There is no pleural effusion or pneumothorax. Stable cardiac silhouette. No acute osseous pathology. The IMPRESSION: No active disease. Electronically Signed   By: Anner Crete M.D.   On: 02/02/2019 16:16      Assessment/Plan Principal Problem:   Sepsis ([place]) Active Problems:   Essential hypertension   Leucocytosis   Thrombocytosis (HCC)   UTI (urinary tract infection)     #1 sepsis: With evidence of UTI.  Most likely secondary to urinary tract infection.  Patient will be admitted and initiated on IV fluids, IV antibiotics and supportive care.  Blood cultures and urine cultures obtained.  Await results and address antibiotics based on that.  #2 hypertension: Blood pressure appears low at this point.  Aggressively hydrate and monitor.  #3 thrombocytopenia: Probably secondary to acute infection.  Monitor platelets.  #4 acute pyelonephritis: Continue treatment with IV antibiotics as above.  #5 chronic low back pain: Resume home regimen and monitor.  #6 depression with anxiety: Resume home regimen and continue counseling  #7 shortness of breath: No obvious cause.  May be reaction to infection.  Monitor patient.  DVT prophylaxis: Lovenox Code Status: Full code Family Communication: No family at bedside Disposition Plan: Home Consults called: None Admission status: Inpatient  Severity of Illness: The appropriate patient status for this patient is INPATIENT. Inpatient status is judged to be reasonable and necessary in order to provide the required intensity of service to ensure the patient's safety. The patient's presenting symptoms, physical exam findings, and initial radiographic and laboratory data in the context of their chronic comorbidities is felt to place them at high  risk for further clinical deterioration. Furthermore, it is not anticipated that the patient will be medically stable for discharge from the hospital within 2 midnights of admission. The following factors support the patient status of inpatient.   " The patient's presenting symptoms include nausea vomiting and shortness of breath. " The worrisome physical exam findings include mild CVA tenderness. " The initial radiographic and laboratory data are worrisome because of evidence of pyelonephritis. " The chronic co-morbidities include chronic low back pain.   * I certify that at the point of admission it is my clinical judgment that the patient will  require inpatient hospital care spanning beyond 2 midnights from the point of admission due to high intensity of service, high risk for further deterioration and high frequency of surveillance required.Barbette Merino MD [facility] Pager [phone]  If 7PM-7AM, please contact night-coverage [email or web address] Password [facility]  02/02/2019, 11:08 PM

## 2019-02-02 NOTE — ED Notes (Signed)
Patient transported to CT via stretcher.

## 2019-02-02 NOTE — ED Triage Notes (Signed)
Patient arrived by EMS from home. Pt c/o SOB, Cough, and body aches x 3 days.   Pt has not been tested for COVID-19 per EMS.   Pt placed on 2L [place] for comfort.   Uses inhaler but was unable to state why she uses it.

## 2019-02-03 ENCOUNTER — Encounter (HOSPITAL_COMMUNITY): Payer: Self-pay

## 2019-02-03 ENCOUNTER — Other Ambulatory Visit: Payer: Self-pay

## 2019-02-03 DIAGNOSIS — I10 Essential (primary) hypertension: Secondary | ICD-10-CM

## 2019-02-03 DIAGNOSIS — N3000 Acute cystitis without hematuria: Secondary | ICD-10-CM

## 2019-02-03 LAB — MAGNESIUM: Magnesium: 1.7 mg/dL (ref 1.7–2.4)

## 2019-02-03 LAB — BLOOD CULTURE ID PANEL (REFLEXED)

## 2019-02-03 LAB — CBC
HCT: 26.2 % — ABNORMAL LOW (ref 36.0–46.0)
Hemoglobin: 8.5 g/dL — ABNORMAL LOW (ref 12.0–15.0)
MCH: 22.3 pg — ABNORMAL LOW (ref 26.0–34.0)
MCHC: 32.4 g/dL (ref 30.0–36.0)
MCV: 68.6 fL — ABNORMAL LOW (ref 80.0–100.0)
Platelets: 85 10*3/uL — ABNORMAL LOW (ref 150–400)
RBC: 3.82 MIL/uL — ABNORMAL LOW (ref 3.87–5.11)
RDW: 18.6 % — ABNORMAL HIGH (ref 11.5–15.5)
WBC: 10.7 10*3/uL — ABNORMAL HIGH (ref 4.0–10.5)
nRBC: 0 % (ref 0.0–0.2)

## 2019-02-03 LAB — COMPREHENSIVE METABOLIC PANEL
ALT: 20 U/L (ref 0–44)
AST: 29 U/L (ref 15–41)
Albumin: 2.1 g/dL — ABNORMAL LOW (ref 3.5–5.0)
Alkaline Phosphatase: 102 U/L (ref 38–126)
Anion gap: 12 (ref 5–15)
BUN: 34 mg/dL — ABNORMAL HIGH (ref 6–20)
CO2: 21 mmol/L — ABNORMAL LOW (ref 22–32)
Calcium: 7.3 mg/dL — ABNORMAL LOW (ref 8.9–10.3)
Chloride: 106 mmol/L (ref 98–111)
Creatinine, Ser: 1.71 mg/dL — ABNORMAL HIGH (ref 0.44–1.00)
GFR calc Af Amer: 41 mL/min — ABNORMAL LOW (ref >60)
GFR calc non Af Amer: 36 mL/min — ABNORMAL LOW (ref >60)
Glucose, Bld: 41 mg/dL — CL (ref 70–99)
Potassium: 2.7 mmol/L — CL (ref 3.5–5.1)
Sodium: 139 mmol/L (ref 135–145)
Total Bilirubin: 1.8 mg/dL — ABNORMAL HIGH (ref 0.3–1.2)
Total Protein: 5 g/dL — ABNORMAL LOW (ref 6.5–8.1)

## 2019-02-03 LAB — GLUCOSE, CAPILLARY
Glucose-Capillary: 30 mg/dL — CL (ref 70–99)
Glucose-Capillary: 37 mg/dL — CL (ref 70–99)
Glucose-Capillary: 70 mg/dL (ref 70–99)
Glucose-Capillary: 103 mg/dL — ABNORMAL HIGH (ref 70–99)
Glucose-Capillary: 65 mg/dL — ABNORMAL LOW (ref 70–99)
Glucose-Capillary: 66 mg/dL — ABNORMAL LOW (ref 70–99)
Glucose-Capillary: 80 mg/dL (ref 70–99)

## 2019-02-03 LAB — HIV ANTIBODY (ROUTINE TESTING W REFLEX): HIV Screen 4th Generation wRfx: NONREACTIVE

## 2019-02-03 MED ORDER — HYDROMORPHONE HCL 1 MG/ML IJ SOLN
0.5000 mg | INTRAMUSCULAR | Status: DC | PRN
  Administered 2019-02-03: 15:00:00 0.5 mg via INTRAVENOUS
  Filled 2019-02-03: qty 0.5

## 2019-02-03 MED ORDER — SODIUM CHLORIDE 0.9 % IV SOLN
2.0000 g | INTRAVENOUS | Status: DC
  Administered 2019-02-03 – 2019-02-04 (×2): 2 g via INTRAVENOUS
  Filled 2019-02-03: qty 20
  Filled 2019-02-03 (×2): qty 2

## 2019-02-03 MED ORDER — HYDROMORPHONE HCL 1 MG/ML IJ SOLN
1.0000 mg | INTRAMUSCULAR | Status: DC | PRN
  Administered 2019-02-03 – 2019-02-07 (×24): 1 mg via INTRAVENOUS
  Filled 2019-02-03 (×26): qty 1

## 2019-02-03 MED ORDER — DEXTROSE-NACL 5-0.9 % IV SOLN
INTRAVENOUS | Status: DC
  Administered 2019-02-03 – 2019-02-06 (×8): via INTRAVENOUS

## 2019-02-03 MED ORDER — INSULIN ASPART 100 UNIT/ML ~~LOC~~ SOLN: 0.0000 [IU] | SUBCUTANEOUS | Status: DC

## 2019-02-03 MED ORDER — ACETAMINOPHEN 325 MG PO TABS
650.0000 mg | ORAL_TABLET | Freq: Four times a day (QID) | ORAL | Status: DC | PRN
  Administered 2019-02-03 – 2019-02-10 (×10): 650 mg via ORAL
  Filled 2019-02-03 (×10): qty 2

## 2019-02-03 MED ORDER — INSULIN ASPART 100 UNIT/ML ~~LOC~~ SOLN
0.0000 [IU] | SUBCUTANEOUS | Status: DC
  Administered 2019-02-06: 1 [IU] via SUBCUTANEOUS

## 2019-02-03 MED ORDER — POTASSIUM CHLORIDE CRYS ER 20 MEQ PO TBCR
40.0000 meq | EXTENDED_RELEASE_TABLET | Freq: Two times a day (BID) | ORAL | Status: AC
  Administered 2019-02-03 (×2): 40 meq via ORAL
  Filled 2019-02-03 (×2): qty 2

## 2019-02-03 MED ORDER — OXYCODONE-ACETAMINOPHEN 5-325 MG PO TABS
1.0000 | ORAL_TABLET | ORAL | Status: DC | PRN
  Administered 2019-02-03: 1 via ORAL
  Filled 2019-02-03: qty 1

## 2019-02-03 NOTE — Progress Notes (Signed)
PHARMACY - PHYSICIAN COMMUNICATION CRITICAL VALUE ALERT - BLOOD CULTURE IDENTIFICATION (BCID)  Terri [name] is an 43 y.o. female who presented to [facility] on 02/02/2019 with a chief complaint of SOB and general weakness  Assessment:  Sepsis most likely secondary to UTI per H&P  Name of physician (or Provider) Contacted: Dr. Wyline Copas  Current antibiotics: Cefepime + vancomycin  Changes to prescribed antibiotics recommended:  Changed antibiotics from cefepime + vancomycin to ceftriaxone 2 g IV q24h per protocol. Do not see a history of MRDO in previous cultures.  Results for orders placed or performed during the hospital encounter of 02/02/19  Blood Culture ID Panel (Reflexed) (Collected: 02/02/2019  4:53 PM)  Result Value Ref Range   Enterococcus species NOT DETECTED NOT DETECTED   Listeria monocytogenes NOT DETECTED NOT DETECTED   Staphylococcus species NOT DETECTED NOT DETECTED   Staphylococcus aureus (BCID) NOT DETECTED NOT DETECTED   Streptococcus species NOT DETECTED NOT DETECTED   Streptococcus agalactiae NOT DETECTED NOT DETECTED   Streptococcus pneumoniae NOT DETECTED NOT DETECTED   Streptococcus pyogenes NOT DETECTED NOT DETECTED   Acinetobacter baumannii NOT DETECTED NOT DETECTED   Enterobacteriaceae species DETECTED (A) NOT DETECTED   Enterobacter cloacae complex NOT DETECTED NOT DETECTED   Escherichia coli DETECTED (A) NOT DETECTED   Klebsiella oxytoca NOT DETECTED NOT DETECTED   Klebsiella pneumoniae NOT DETECTED NOT DETECTED   Proteus species NOT DETECTED NOT DETECTED   Serratia marcescens NOT DETECTED NOT DETECTED   Carbapenem resistance NOT DETECTED NOT DETECTED   Haemophilus influenzae NOT DETECTED NOT DETECTED   Neisseria meningitidis NOT DETECTED NOT DETECTED   Pseudomonas aeruginosa NOT DETECTED NOT DETECTED   Candida albicans NOT DETECTED NOT DETECTED   Candida glabrata NOT DETECTED NOT DETECTED   Candida krusei NOT DETECTED NOT DETECTED   Candida  parapsilosis NOT DETECTED NOT DETECTED   Candida tropicalis NOT DETECTED NOT DETECTED    Lenis Noon, PharmD 02/03/2019  11:07 AM

## 2019-02-03 NOTE — Progress Notes (Signed)
PROGRESS NOTE    Terri [name]  [id number] DOB: [birth year] DOA: 02/02/2019 PCP: [name], MD    Brief Narrative:  43 y.o. female with medical history significant of depression, bipolar disorder, chronic back pain, hypertension, previous DVT, migraine headaches and previous pneumonia who presented to the ER with complaint of shortness of breath cough generalized weakness.  Patient was seen and evaluated.  She has had some chills headaches with vomiting and diarrhea over the last 4 days.  She has been taking Tylenol and NyQuil but no response.  Patient was found to be mildly hypoxic on arrival and placed on oxygen.  She had elevated lactic acid and symptoms suggestive of sepsis.  Chest x-ray was not revealing.  Urinalysis suggested UTI as possible cause of her sepsis.  Patient is being admitted to the hospital for work-up..  ED Course: Temperature is 99.9 blood pressure 81/52 pulse 115 respirate of 32 oxygen sat 91% on 2 L.  White count is 12.6 hemoglobin 9.1 platelets 119.  Potassium 3.4 creatinine 1.7 BUN 30 and calcium of 7.8.  Lactic acid 2.8.  Urinalysis showed cloudy urine with large leukocytes.  Many bacteria WBC 21-50.  Chest x-ray showed no active disease.  CT abdomen pelvis showed bilateral renal enlargement with heterogeneous attenuation.  Consistent with pyelonephritis.  Patient is being admitted for treatment and early sepsis.  Assessment & Plan:   Principal Problem:   Sepsis ([place]) Active Problems:   Essential hypertension   Leucocytosis   Thrombocytosis (HCC)   UTI (urinary tract infection)   #1 Sepsis with ecoli bacteremia and pyelonephritis present on admit:  - Presenting UA suggestive of UTI. Urine cx pending -Blood cx pos for ecoli. Narrowed abx to rocephin 2gm. Discussed with pharmacy -CT abd personally reviewed with patient. Findings of B pyelo  #2 hypertension:  - BP soft this AM with BP meds initially held - Pt otherwise hemodynamially stable -  Continue IVF hydration as tolerated  #3 thrombocytopenia:  -Suspect secondary to acute infection and sepsis -Labs reviewed, plts trending down to 88k this AM -Will stop lovenox and start SCD for DVT prophylaxis -Repeat CBC in AM  #4 acute pyelonephritis:  -Reviewed on CT per above -Continue treatment with IV antibiotics as above.  #5 acute on chronic low back pain:  -Intractable pain, not improved with home neurontin. Likely worse secondary to CVA tenderness from pyelo -No improvement with trial of percocet -Have transitioned to q3h IV dilaudid for pain control  #6 depression with anxiety:  -Continued on home regimen and continue counseling  #7 shortness of breath:  -Patient described abd pain when taking full breath, likely contributing to "shortness of breath" -See above, would optimize analgesia  -vital signs reviewed, on minimal O2 support. Lungs clear. CXR personally reviewed and stable  DVT prophylaxis: SCD's Code Status: Full Family Communication: Pt in room, family not at bedside Disposition Plan: Uncertain at this time  Consultants:     Procedures:     Antimicrobials: Anti-infectives (From admission, onward)   Start     Dose/Rate Route Frequency Ordered Stop   02/03/19 1800  vancomycin (VANCOCIN) 1,250 mg in sodium chloride 0.9 % 250 mL IVPB  Status:  Discontinued     1,250 mg 166.7 mL/hr over 90 Minutes Intravenous Every 24 hours 02/02/19 2321 02/03/19 1103   02/03/19 1600  cefTRIAXone (ROCEPHIN) 2 g in sodium chloride 0.9 % 100 mL IVPB     2 g 200 mL/hr over 30 Minutes Intravenous Every 24 hours 02/03/19  1105     02/03/19 0800  ceFEPIme (MAXIPIME) 2 g in sodium chloride 0.9 % 100 mL IVPB  Status:  Discontinued     2 g 200 mL/hr over 30 Minutes Intravenous Every 12 hours 02/02/19 2317 02/03/19 1103   02/02/19 1700  ceFEPIme (MAXIPIME) 2 g in sodium chloride 0.9 % 100 mL IVPB     2 g 200 mL/hr over 30 Minutes Intravenous  Once 02/02/19 1649 02/02/19  1837   02/02/19 1700  metroNIDAZOLE (FLAGYL) IVPB 500 mg     500 mg 100 mL/hr over 60 Minutes Intravenous  Once 02/02/19 1649 02/02/19 2126   02/02/19 1700  vancomycin (VANCOCIN) IVPB 1000 mg/200 mL premix  Status:  Discontinued     1,000 mg 200 mL/hr over 60 Minutes Intravenous  Once 02/02/19 1649 02/02/19 1653   02/02/19 1700  vancomycin (VANCOCIN) 1,750 mg in sodium chloride 0.9 % 500 mL IVPB     1,750 mg 250 mL/hr over 120 Minutes Intravenous  Once 02/02/19 1653 02/02/19 2011   02/02/19 1645  cefTRIAXone (ROCEPHIN) 1 g in sodium chloride 0.9 % 100 mL IVPB  Status:  Discontinued     1 g 200 mL/hr over 30 Minutes Intravenous  Once 02/02/19 1636 02/02/19 1657       Subjective: Complaining of marked abd/back pain, not improved with percocet  Objective: Vitals:   02/03/19 0423 02/03/19 0733 02/03/19 1128 02/03/19 1523  BP: (!) 84/73 95/61 99/61  (!) 110/47  Pulse: 88 88 93 95  Resp: 18 20 (!) 24 (!) 28  Temp: 97.9 F (36.6 C) (!) 97.4 F (36.3 C) 99 F (37.2 C) 99.6 F (37.6 C)  TempSrc: Oral Oral Oral Oral  SpO2: 98%  100% 97%  Weight:      Height:        Intake/Output Summary (Last 24 hours) at 02/03/2019 1639 Last data filed at 02/03/2019 1530 Gross per 24 hour  Intake 5784.56 ml  Output 602 ml  Net 5182.56 ml   Filed Weights   02/02/19 1313  Weight: 77.1 kg    Examination: General exam: Awake, laying in bed, appears to be in acute pain Respiratory system: Normal respiratory effort, no wheezing Cardiovascular system: regular rate, s1, s2 Gastrointestinal system: Soft, nondistended, positive BS Central nervous system: CN2-12 grossly intact, strength intact Extremities: Perfused, no clubbing Skin: Normal skin turgor, no notable skin lesions seen Psychiatry: Mood normal // no visual hallucinations   Data Reviewed: I have personally reviewed following labs and imaging studies  CBC: Recent Labs  Lab 02/02/19 1753 02/03/19 0516  WBC 12.6* 10.7*  NEUTROABS  11.3*  --   HGB 9.0* 8.5*  HCT 27.5* 26.2*  MCV 67.4* 68.6*  PLT 119* 85*   Basic Metabolic Panel: Recent Labs  Lab 02/02/19 1510 02/03/19 0516  NA 136 139  K 3.4* 2.7*  CL 99 106  CO2 23 21*  GLUCOSE 53* 41*  BUN 30* 34*  CREATININE 1.70* 1.71*  CALCIUM 7.8* 7.3*  MG  --  1.7   GFR: Estimated Creatinine Clearance: 43.4 mL/min (A) (by C-G formula based on SCr of 1.71 mg/dL (H)). Liver Function Tests: Recent Labs  Lab 02/02/19 1510 02/03/19 0516  AST 33 29  ALT 18 20  ALKPHOS 117 102  BILITOT 1.9* 1.8*  PROT 5.7* 5.0*  ALBUMIN 2.5* 2.1*   No results for input(s): LIPASE, AMYLASE in the last 168 hours. No results for input(s): AMMONIA in the last 168 hours. Coagulation Profile: Recent Labs  Lab 02/02/19 1753  INR 1.1   Cardiac Enzymes: No results for input(s): CKTOTAL, CKMB, CKMBINDEX, TROPONINI in the last 168 hours. BNP (last 3 results) No results for input(s): PROBNP in the last 8760 hours. HbA1C: No results for input(s): HGBA1C in the last 72 hours. CBG: Recent Labs  Lab 02/03/19 0741 02/03/19 0816 02/03/19 1131 02/03/19 1151 02/03/19 1221  GLUCAP 37* 70 66* 65* 103*   Lipid Profile: No results for input(s): CHOL, HDL, LDLCALC, TRIG, CHOLHDL, LDLDIRECT in the last 72 hours. Thyroid Function Tests: No results for input(s): TSH, T4TOTAL, FREET4, T3FREE, THYROIDAB in the last 72 hours. Anemia Panel: No results for input(s): VITAMINB12, FOLATE, FERRITIN, TIBC, IRON, RETICCTPCT in the last 72 hours. Sepsis Labs: Recent Labs  Lab 02/02/19 1510 02/02/19 1753 02/02/19 1848  LATICACIDVEN 2.8* 1.7 1.6    Recent Results (from the past 240 hour(s))  SARS Coronavirus 2 [facility] order, Performed in [facility] hospital lab) Nasopharyngeal Nasopharyngeal Swab     Status: None   Collection Time: 02/02/19  3:10 PM   Specimen: Nasopharyngeal Swab  Result Value Ref Range Status   SARS Coronavirus 2 NEGATIVE NEGATIVE Final    Comment: (NOTE) If result  is NEGATIVE SARS-CoV-2 target nucleic acids are NOT DETECTED. The SARS-CoV-2 RNA is generally detectable in upper and lower  respiratory specimens during the acute phase of infection. The lowest  concentration of SARS-CoV-2 viral copies this assay can detect is 250  copies / mL. A negative result does not preclude SARS-CoV-2 infection  and should not be used as the sole basis for treatment or other  patient management decisions.  A negative result may occur with  improper specimen collection / handling, submission of specimen other  than nasopharyngeal swab, presence of viral mutation(s) within the  areas targeted by this assay, and inadequate number of viral copies  (<250 copies / mL). A negative result must be combined with clinical  observations, patient history, and epidemiological information. If result is POSITIVE SARS-CoV-2 target nucleic acids are DETECTED. The SARS-CoV-2 RNA is generally detectable in upper and lower  respiratory specimens dur ing the acute phase of infection.  Positive  results are indicative of active infection with SARS-CoV-2.  Clinical  correlation with patient history and other diagnostic information is  necessary to determine patient infection status.  Positive results do  not rule out bacterial infection or co-infection with other viruses. If result is PRESUMPTIVE POSTIVE SARS-CoV-2 nucleic acids MAY BE PRESENT.   A presumptive positive result was obtained on the submitted specimen  and confirmed on repeat testing.  While 2019 novel coronavirus  (SARS-CoV-2) nucleic acids may be present in the submitted sample  additional confirmatory testing may be necessary for epidemiological  and / or clinical management purposes  to differentiate between  SARS-CoV-2 and other Sarbecovirus currently known to infect humans.  If clinically indicated additional testing with an alternate test  methodology [id number]) is advised. The SARS-CoV-2 RNA is generally   detectable in upper and lower respiratory sp ecimens during the acute  phase of infection. The expected result is Negative. Fact Sheet for Patients:  StrictlyIdeas.no Fact Sheet for Healthcare Providers: BankingDealers.co.za This test is not yet approved or cleared by the [place] FDA and has been authorized for detection and/or diagnosis of SARS-CoV-2 by FDA under an Emergency Use Authorization (EUA).  This EUA will remain in effect (meaning this test can be used) for the duration of the COVID-19 declaration under Section 564(b)(1) of the Act, 21 U.S.C.  section 360bbb-3(b)(1), unless the authorization is terminated or revoked sooner. Performed at [facility], [place] [address] [place], [place] [address]   Blood Culture (routine x 2)     Status: None (Preliminary result)   Collection Time: 02/02/19  4:53 PM   Specimen: BLOOD LEFT FOREARM  Result Value Ref Range Status   Specimen Description   Final    BLOOD LEFT FOREARM Performed at [facility] [address], [place], [place] [address]    Special Requests   Final    BOTTLES DRAWN AEROBIC AND ANAEROBIC Blood Culture adequate volume Performed at [facility] [address], [place], [place] [address]    Culture  Setup Time   Final    GRAM NEGATIVE RODS IN BOTH AEROBIC AND ANAEROBIC BOTTLES CRITICAL RESULT CALLED TO, READ BACK BY AND VERIFIED WITH: PHARMD M LILLISTONE [id number] AT 1021 AM BY CM Performed at [facility], [place] [address] [place], [place] [address]    Culture GRAM NEGATIVE RODS  Final   Report Status PENDING  Incomplete  Blood Culture ID Panel (Reflexed)     Status: Abnormal   Collection Time: 02/02/19  4:53 PM  Result Value Ref Range Status   Enterococcus species NOT DETECTED NOT DETECTED Final   Listeria monocytogenes NOT DETECTED NOT DETECTED Final   Staphylococcus species NOT DETECTED NOT  DETECTED Final   Staphylococcus aureus (BCID) NOT DETECTED NOT DETECTED Final   Streptococcus species NOT DETECTED NOT DETECTED Final   Streptococcus agalactiae NOT DETECTED NOT DETECTED Final   Streptococcus pneumoniae NOT DETECTED NOT DETECTED Final   Streptococcus pyogenes NOT DETECTED NOT DETECTED Final   Acinetobacter baumannii NOT DETECTED NOT DETECTED Final   Enterobacteriaceae species DETECTED (A) NOT DETECTED Final    Comment: Enterobacteriaceae represent a large family of gram-negative bacteria, not a single organism. CRITICAL RESULT CALLED TO, READ BACK BY AND VERIFIED WITH: PHARMD M LILLISTONE [id number] AT 1022 BY CM    Enterobacter cloacae complex NOT DETECTED NOT DETECTED Final   Escherichia coli DETECTED (A) NOT DETECTED Final    Comment: CRITICAL RESULT CALLED TO, READ BACK BY AND VERIFIED WITH: PHARMD M LILLISTONE [id number] AT 93 AM BY CM    Klebsiella oxytoca NOT DETECTED NOT DETECTED Final   Klebsiella pneumoniae NOT DETECTED NOT DETECTED Final   Proteus species NOT DETECTED NOT DETECTED Final   Serratia marcescens NOT DETECTED NOT DETECTED Final   Carbapenem resistance NOT DETECTED NOT DETECTED Final   Haemophilus influenzae NOT DETECTED NOT DETECTED Final   Neisseria meningitidis NOT DETECTED NOT DETECTED Final   Pseudomonas aeruginosa NOT DETECTED NOT DETECTED Final   Candida albicans NOT DETECTED NOT DETECTED Final   Candida glabrata NOT DETECTED NOT DETECTED Final   Candida krusei NOT DETECTED NOT DETECTED Final   Candida parapsilosis NOT DETECTED NOT DETECTED Final   Candida tropicalis NOT DETECTED NOT DETECTED Final    Comment: Performed at [facility], [place] [address], [place], [place] [address]  Blood Culture (routine x 2)     Status: None (Preliminary result)   Collection Time: 02/02/19  5:53 PM   Specimen: BLOOD  Result Value Ref Range Status   Specimen Description   Final    BLOOD RIGHT ANTECUBITAL Performed at [facility] [address] [place], [place] [address]    Special Requests   Final    BOTTLES DRAWN AEROBIC AND ANAEROBIC Blood Culture results may not be optimal due  to an excessive volume of blood received in culture bottles Performed at [facility] [address] [place], [place] [address]    Culture  Setup Time   Final    GRAM NEGATIVE RODS IN BOTH AEROBIC AND ANAEROBIC BOTTLES CRITICAL RESULT CALLED TO, READ BACK BY AND VERIFIED WITH: PHARMD M LILLISTONE [id number] AT 1020 AM BY CM Performed at [facility], [place] [address] [place], [place] [address]    Culture GRAM NEGATIVE RODS  Final   Report Status PENDING  Incomplete     Radiology Studies: Ct Abdomen Pelvis Wo Contrast  Result Date: 02/02/2019 CLINICAL DATA:  Shortness of breath, cough and body aches for 3 days, septic with abdominal and back pain EXAM: CT ABDOMEN AND PELVIS WITHOUT CONTRAST TECHNIQUE: Multidetector CT imaging of the abdomen and pelvis was performed following the standard protocol without IV contrast. COMPARISON:  CT 04/06/2018 FINDINGS: Lower chest: Bandlike areas of basilar atelectasis and/or scarring. Normal heart size. No pericardial effusion. Hepatobiliary: No focal liver abnormality is seen. Patient is post cholecystectomy. Slight prominence of the biliary tree likely related to reservoir effect. No calcified intraductal gallstones. Pancreas: Unremarkable. No pancreatic ductal dilatation or surrounding inflammatory changes. Spleen: Normal in size without focal abnormality. Adrenals/Urinary Tract: Normal adrenal glands. There is bilateral renal enlargement with a heterogeneous attenuation and significant surrounding stranding. No hydronephrosis or obstructive urolithiasis. No visible or contour deforming renal lesions. Urinary bladder is circumferentially thickened. Stomach/Bowel: Patient is post gastric bypass with continued decrease in the amount of material within the excluded portion of the gastric  pouch. There is focal dilatation of a small bowel loop adjacent the distal small bowel anastomosis up to 4.3 cm however the adjacent bowel loops are more normal caliber and there is passage of material beyond the level of the anastomosis. No other small bowel dilatation or mural thickening. A normal appendix is visualized. No colonic dilatation or wall thickening. Vascular/Lymphatic: Atherosclerotic plaque within the normal caliber aorta. Further evaluation the vessels is limited by absence of contrast. No suspicious or enlarged lymph nodes in the included lymphatic chains. Reproductive: Normal appearance of the uterus and adnexal structures. Bilateral ligation clips are noted. Other: Extensive perinephric stranding, as above. No free fluid or free gas. No bowel containing hernia. Postsurgical changes are seen in the upper anterior midline abdomen. Musculoskeletal: Multilevel degenerative changes are present in the imaged portions of the spine. Features are most pronounced at L3-4 and L4-5 levels. Postsurgical changes from prior posterior spinal decompression. Partial fragmentation of the L3 spinous process. IMPRESSION: 1. Bilateral renal enlargement with a heterogeneous attenuation and significant surrounding stranding with circumferentially thickened bladder wall suggesting an ascending urinary tract infection with pyelonephritis. 2. Focal dilatation of a small bowel loop adjacent the distal small bowel anastomosis however the adjacent bowel loops are more normal caliber and there is passage of material beyond the level of the anastomosis. Findings could reflect normal postsurgical denervation of the bowel loop, focal ileus or partial obstruction. Correlation with symptoms is recommended. 3. Aortic Atherosclerosis (ICD10-I70.0). Electronically Signed   By: Lovena Le M.D.   On: 02/02/2019 20:04   Dg Chest Port 1 View  Result Date: 02/02/2019 CLINICAL DATA:  43 year old female with shortness of breath and  cough. EXAM: PORTABLE CHEST 1 VIEW COMPARISON:  Chest radiograph dated 09/13/2017 FINDINGS: The lungs are clear. There is no pleural effusion or pneumothorax. Stable cardiac silhouette. No acute osseous pathology. The IMPRESSION: No active disease. Electronically Signed   By: Milas Hock  Radparvar M.D.   On: 02/02/2019 16:16    Scheduled Meds: . amitriptyline  75 mg Oral QHS  . enoxaparin (LOVENOX) injection  40 mg Subcutaneous QHS  . gabapentin  300 mg Oral TID  . hydrochlorothiazide  12.5 mg Oral Daily  . insulin aspart  0-9 Units Subcutaneous Q4H  . lisinopril  10 mg Oral Daily  . pantoprazole  40 mg Oral Daily  . potassium chloride  40 mEq Oral BID   Continuous Infusions: . cefTRIAXone (ROCEPHIN)  IV 2 g (02/03/19 1510)  . dextrose 5 % and 0.9% NaCl 125 mL/hr at 02/03/19 1418     LOS: 1 day   Marylu Lund, MD [facility] Pager On Amion  If 7PM-7AM, please contact night-coverage 02/03/2019, 4:39 PM

## 2019-02-03 NOTE — Progress Notes (Addendum)
Hypoglycemic Event  CBG: 37  Treatment: 8 oz juice/soda  Symptoms: None  Follow-up CBG: [id number] CBG Result:70  Possible Reasons for Event: Inadequate meal intake  Comments/MD notified:chiu     Terri [name] Terri [name]

## 2019-02-03 NOTE — Progress Notes (Signed)
   02/03/19 2133  Pain Assessment  Pain Scale 0-10  Pain Score 8  Pain Type Acute pain  Pain Location Back  Pain Orientation Lower  Pain Descriptors / Indicators Shooting;Sharp  Pain Onset On-going  Patients Stated Pain Goal 3  Pain Intervention(s) RN made aware;Rest;Relaxation;Emotional support  Multiple Pain Sites No  MEWS Assessment  Is this an acute change? No  Provider Notification  Provider Name/Title Bodenheimer  Date Provider Notified 02/03/19  Time Provider Notified 2352  Notification Type Page  Notification Reason Other (Comment)  Response No new orders   MD made aware

## 2019-02-03 NOTE — Progress Notes (Signed)
Held lisinopril and HCTZ due to soft bp 95/61 and HR 88. Dr. Wyline Copas made aware.

## 2019-02-03 NOTE — Progress Notes (Signed)
Hypoglycemic Event  CBG: 30  Treatment: 8 oz juice/soda  Symptoms: None  Follow-up CBG: Time: 0741 CBG Result:37  Possible Reasons for Event: Inadequate meal intake  Comments/MD notified:Chiu    [place] Boccio Jossie Ng

## 2019-02-03 NOTE — Progress Notes (Signed)
Hypoglycemic Event  CBG: 66  Treatment: 4 oz juice/soda  Symptoms: None  Follow-up CBG: Time:1221 CBG Result:106  Possible Reasons for Event: Inadequate meal intake  Comments/MD notified:chiu    Zalika Tieszen K Cameron Ali

## 2019-02-03 NOTE — Progress Notes (Signed)
Pt breathing 26-33 RR, shallow breath and states pain on exertion and when ask to cough. Pulse ox shows 97% on 2 L nasal canula . Dr. Wyline Copas made aware.

## 2019-02-04 DIAGNOSIS — N179 Acute kidney failure, unspecified: Secondary | ICD-10-CM

## 2019-02-04 LAB — BASIC METABOLIC PANEL
Anion gap: 7 (ref 5–15)
BUN: 41 mg/dL — ABNORMAL HIGH (ref 6–20)
CO2: 22 mmol/L (ref 22–32)
Calcium: 7.7 mg/dL — ABNORMAL LOW (ref 8.9–10.3)
Chloride: 109 mmol/L (ref 98–111)
Creatinine, Ser: 2.03 mg/dL — ABNORMAL HIGH (ref 0.44–1.00)
GFR calc Af Amer: 33 mL/min — ABNORMAL LOW (ref >60)
GFR calc non Af Amer: 29 mL/min — ABNORMAL LOW (ref >60)
Glucose, Bld: 80 mg/dL (ref 70–99)
Potassium: 3.5 mmol/L (ref 3.5–5.1)
Sodium: 138 mmol/L (ref 135–145)

## 2019-02-04 LAB — CBC
HCT: 25.8 % — ABNORMAL LOW (ref 36.0–46.0)
Hemoglobin: 8.4 g/dL — ABNORMAL LOW (ref 12.0–15.0)
MCH: 22.1 pg — ABNORMAL LOW (ref 26.0–34.0)
MCHC: 32.6 g/dL (ref 30.0–36.0)
MCV: 67.9 fL — ABNORMAL LOW (ref 80.0–100.0)
Platelets: 73 10*3/uL — ABNORMAL LOW (ref 150–400)
RBC: 3.8 MIL/uL — ABNORMAL LOW (ref 3.87–5.11)
RDW: 18.6 % — ABNORMAL HIGH (ref 11.5–15.5)
WBC: 14.9 10*3/uL — ABNORMAL HIGH (ref 4.0–10.5)
nRBC: 0 % (ref 0.0–0.2)

## 2019-02-04 LAB — GLUCOSE, CAPILLARY
Glucose-Capillary: 63 mg/dL — ABNORMAL LOW (ref 70–99)
Glucose-Capillary: 82 mg/dL (ref 70–99)
Glucose-Capillary: 79 mg/dL (ref 70–99)
Glucose-Capillary: 80 mg/dL (ref 70–99)

## 2019-02-04 MED ORDER — MAGIC MOUTHWASH
10.0000 mL | Freq: Four times a day (QID) | ORAL | Status: DC
  Administered 2019-02-04 – 2019-02-11 (×10): 10 mL via ORAL
  Filled 2019-02-04 (×31): qty 10

## 2019-02-04 MED ORDER — PHENOL 1.4 % MT LIQD
1.0000 | OROMUCOSAL | Status: DC | PRN
  Administered 2019-02-04: 13:00:00 1 via OROMUCOSAL
  Filled 2019-02-04: qty 177

## 2019-02-04 NOTE — Plan of Care (Signed)
  Problem: Education: Goal: Knowledge of General Education information will improve Description: Including pain rating scale, medication(s)/side effects and non-pharmacologic comfort measures 02/04/2019 0951 by Annie Sable, RN Outcome: Progressing 02/04/2019 0951 by Annie Sable, RN Outcome: Progressing   Problem: Clinical Measurements: Goal: Ability to maintain clinical measurements within normal limits will improve Outcome: Progressing Goal: Will remain free from infection Outcome: Progressing Goal: Diagnostic test results will improve Outcome: Progressing Goal: Respiratory complications will improve Outcome: Progressing Goal: Cardiovascular complication will be avoided Outcome: Progressing   Problem: Activity: Goal: Risk for activity intolerance will decrease Outcome: Progressing   Problem: Nutrition: Goal: Adequate nutrition will be maintained Outcome: Progressing   Problem: Coping: Goal: Level of anxiety will decrease Outcome: Progressing   Problem: Elimination: Goal: Will not experience complications related to bowel motility Outcome: Progressing Goal: Will not experience complications related to urinary retention Outcome: Progressing   Problem: Pain Managment: Goal: General experience of comfort will improve Outcome: Progressing   Problem: Safety: Goal: Ability to remain free from injury will improve Outcome: Progressing   Problem: Skin Integrity: Goal: Risk for impaired skin integrity will decrease Outcome: Completed/Met   Problem: Urinary Elimination: Goal: Signs and symptoms of infection will decrease Outcome: Progressing

## 2019-02-04 NOTE — Evaluation (Signed)
Clinical/Bedside Swallow Evaluation Patient Details  Name: Terri [name] MRN: [id number] Date of Birth: Mar 14, 1976  Today's Date: 02/04/2019 Time: SLP Start Time (ACUTE ONLY): [id number] SLP Stop Time (ACUTE ONLY): 1508 SLP Time Calculation (min) (ACUTE ONLY): 12 min  Past Medical History:  Past Medical History:  Diagnosis Date  . Arthritis    Back and legs  . Bipolar disorder ([place])   . Chronic back pain   . Depression   . DVT (deep venous thrombosis) ([facility])    early 20's leg  . History of blood transfusion   . Hypertension   . Migraine headache   . Neuropathy   . Pneumonia   . 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 Sciatica   . Stress incontinence    Past Surgical History:  Past Surgical History:  Procedure Laterality Date  . ALVEOLOPLASTY Bilateral 01/31/2018   Procedure: ALVEOLOPLASTY;  Surgeon: Diona Browner, DDS;  Location: [facility];  Service: Oral Surgery;  Laterality: Bilateral;  . ANKLE SURGERY Left 1992   with hardware  . BACK SURGERY    . CESAREAN SECTION    . CHOLECYSTECTOMY N/A 02/15/2017   Procedure: LAPAROSCOPIC CHOLECYSTECTOMY;  Surgeon: Clovis Riley, MD;  Location: [facility];  Service: General;  Laterality: N/A;  . ESOPHAGOGASTRODUODENOSCOPY (EGD) WITH PROPOFOL N/A 09/15/2017   Procedure: ESOPHAGOGASTRODUODENOSCOPY (EGD) WITH PROPOFOL;  Surgeon: Clarene Essex, MD;  Location: [facility];  Service: Endoscopy;  Laterality: N/A;  . GASTRIC BYPASS    . INCISION AND DRAINAGE OF PERITONSILLAR ABCESS Left 08/13/2017   Procedure: INCISION AND DRAINAGE OF LEFT NECK ABSCESS;  Surgeon: Melida Quitter, MD;  Location: [facility];  Service: ENT;  Laterality: Left;  . LAPAROSCOPIC LYSIS OF ADHESIONS  02/15/2017   Procedure: LAPAROSCOPIC LYSIS OF ADHESIONS;  Surgeon: Clovis Riley, MD;  Location: [facility];  Service: General;;  . LUMBAR [place]    . LUMBAR FUSION    . TOOTH EXTRACTION Bilateral 01/31/2018   Procedure: DENTAL RESTORATION/EXTRACTIONS;  Surgeon: Diona Browner, DDS;   Location: [facility];  Service: Oral Surgery;  Laterality: Bilateral;  . TUBAL LIGATION    . UPPER GI ENDOSCOPY N/A 02/15/2017   Procedure: UPPER GI ENDOSCOPY;  Surgeon: Clovis Riley, MD;  Location: [facility];  Service: General;  Laterality: N/A;   HPI:  Terri [name] is a 43 y.o. female with medical history significant of depression, bipolar disorder, chronic back pain, hypertension, previous DVT, migraine headaches and previous pneumonia who presented to the ER with complaint of shortness of breath cough generalized weakness.  Patient was seen and evaluated.  She has had some chills headaches with vomiting and diarrhea over the last 4 days.  She has been taking Tylenol and NyQuil but no response.  Patient was found to be mildly hypoxic on arrival and placed on oxygen.  She had elevated lactic acid and symptoms suggestive of sepsis.  Chest x-ray was not revealing.  Urinalysis suggested UTI as possible cause of her sepsis.  Patient is being admitted to the hospital for work-up.  Chest xray completed 02/02/2019 was showing no active disease.  CT of the abdomen/pelvis was showing bandlike areas of basilar atelectasis and.or scarring.    Assessment / Plan / Recommendation Clinical Impression  Clinical swallowing evaluation was completed using thin liquids via spoon, cup and straw and pureed material.  Dry solids were not given due to pain.  RN reported no obvious issues with intake but did state the patient has had  gastric bypass surgery.  The patient endorsed issues swallowing characterized by pain with swallowing and the sensation that things do not want to go down.  She points to her throat area for both the location of the pain and sensation of where material sticks.  She reported that it's been going on for approximately a week.  Cranial nerve exam was completed and unremarkable.  Lingual, labial, facial and jaw range of motion and strength were adequate.  Sensation was intact.  She presented with a possible  oral and pharyngeal dysphagia.   A/P transport of presented boluses appeared to be slow.  Swallow trigger was appreciated to palpation.  Intermittent coughing was seen across all textures.  It was difficult to tell if related to intake as the patient was coughing prior to any PO intake.  Recommend MBS next date to assess swallowing physiology and safety.   SLP Visit Diagnosis: Dysphagia, unspecified (R13.10)    Aspiration Risk  Mild aspiration risk    Diet Recommendation   Regular with thin liquids pending results of MBS next date.    Medication Administration: Whole meds with liquid    Other  Recommendations Oral Care Recommendations: Oral care BID   Follow up Recommendations Other (comment)(TBD)      Frequency and Duration   To be determined pending MBS next date.             Swallow Study   General Date of Onset: 02/02/19 HPI: Terri [name] is a 43 y.o. female with medical history significant of depression, bipolar disorder, chronic back pain, hypertension, previous DVT, migraine headaches and previous pneumonia who presented to the ER with complaint of shortness of breath cough generalized weakness.  Patient was seen and evaluated.  She has had some chills headaches with vomiting and diarrhea over the last 4 days.  She has been taking Tylenol and NyQuil but no response.  Patient was found to be mildly hypoxic on arrival and placed on oxygen.  She had elevated lactic acid and symptoms suggestive of sepsis.  Chest x-ray was not revealing.  Urinalysis suggested UTI as possible cause of her sepsis.  Patient is being admitted to the hospital for work-up.  Chest xray completed 02/02/2019 was showing no active disease.  CT of the abdomen/pelvis was showing bandlike areas of basilar atelectasis and.or scarring.  Type of Study: Bedside Swallow Evaluation Previous Swallow Assessment: None noted at [facility]. Diet Prior to this Study: Regular;Thin liquids Temperature Spikes Noted: Yes History of  Recent Intubation: No Behavior/Cognition: Alert;Cooperative Oral Cavity Assessment: Dry Oral Care Completed by SLP: No Oral Cavity - Dentition: Edentulous Vision: Functional for self-feeding Self-Feeding Abilities: Able to feed self Patient Positioning: Upright in bed Baseline Vocal Quality: Normal Volitional Swallow: Able to elicit    Oral/Motor/Sensory Function Overall Oral Motor/Sensory Function: Within functional limits   Ice Chips Ice chips: Not tested   Thin Liquid Thin Liquid: Impaired Presentation: Spoon;Straw;Cup;Self Fed Pharyngeal  Phase Impairments: Cough - Delayed(Inconsistent)    Nectar Thick Nectar Thick Liquid: Not tested   Honey Thick Honey Thick Liquid: Not tested   Puree Puree: Impaired Presentation: Spoon Pharyngeal Phase Impairments: Cough - Delayed(intermittent)   Solid     Solid: Not tested     Shelly Flatten, MA, CCC-SLP Acute Rehab SLP [phone]  Lamar Sprinkles 02/04/2019,3:15 PM

## 2019-02-04 NOTE — Progress Notes (Signed)
PROGRESS NOTE    Terri [name]  [id number] DOB: 07/24/1975 DOA: 02/02/2019 PCP: [name], MD    Brief Narrative:  43 y.o. female with medical history significant of depression, bipolar disorder, chronic back pain, hypertension, previous DVT, migraine headaches and previous pneumonia who presented to the ER with complaint of shortness of breath cough generalized weakness.  Patient was seen and evaluated.  She has had some chills headaches with vomiting and diarrhea over the last 4 days.  She has been taking Tylenol and NyQuil but no response.  Patient was found to be mildly hypoxic on arrival and placed on oxygen.  She had elevated lactic acid and symptoms suggestive of sepsis.  Chest x-ray was not revealing.  Urinalysis suggested UTI as possible cause of her sepsis.  Patient is being admitted to the hospital for work-up..  ED Course: Temperature is 99.9 blood pressure 81/52 pulse 115 respirate of 32 oxygen sat 91% on 2 L.  White count is 12.6 hemoglobin 9.1 platelets 119.  Potassium 3.4 creatinine 1.7 BUN 30 and calcium of 7.8.  Lactic acid 2.8.  Urinalysis showed cloudy urine with large leukocytes.  Many bacteria WBC 21-50.  Chest x-ray showed no active disease.  CT abdomen pelvis showed bilateral renal enlargement with heterogeneous attenuation.  Consistent with pyelonephritis.  Patient is being admitted for treatment and early sepsis.  Assessment & Plan:   Principal Problem:   Sepsis ([place]) Active Problems:   Essential hypertension   Leucocytosis   Thrombocytosis (HCC)   UTI (urinary tract infection)   #1 Sepsis with ecoli bacteremia and pyelonephritis present on admit:  - Presenting UA suggestive of UTI. Urine cx notable for gm neg rods, speciation and sensitivities pending - Blood cx pos for ecoli. Currently on rocephin. Pending sensitivities -B pyelo on CT abd. Afebrile. WBC is higher today to 14.9k. Cont current abx -Repeat CBC in AM  #2 hypertension:  - BP soft this  AM with BP meds initially held - Pt otherwise hemodynamially stable - Continue IVF hydration as tolerated  #3 thrombocytopenia:  -Suspect secondary to acute infection and sepsis -Labs reviewed, plts are worsening today to 73k -No sign of acute bleed -Repeat CBC in AM  #4 acute pyelonephritis:  -Reviewed on CT per above -Continue treatment with IV antibiotics as above. -Pain better with IV dilaudid  #5 acute on chronic low back pain:  -Intractable pain, not improved with home neurontin. Likely worse secondary to CVA tenderness from pyelo -No improvement with trial of percocet -Improvement with IV dialudid  #6 depression with anxiety:  -Continued on home regimen and continue counseling  #7 shortness of breath:  -Patient described abd pain when taking full breath, likely contributing to "shortness of breath" -Cont to optimize analgesia -Seems improved today  #8 ARF -Cr worsened today, with Cr up to 2.03 -Clinically dehydrated  -Will continue on IVF at 125cc/hr -encourage PO intake -Repeat bmet in AM  #9 Dysphagia -New issue -REports difficulty swallowing -Will consult SLP   DVT prophylaxis: SCD's Code Status: Full Family Communication: Pt in room, family not at bedside Disposition Plan: Uncertain at this time  Consultants:     Procedures:     Antimicrobials: Anti-infectives (From admission, onward)   Start     Dose/Rate Route Frequency Ordered Stop   02/03/19 1800  vancomycin (VANCOCIN) 1,250 mg in sodium chloride 0.9 % 250 mL IVPB  Status:  Discontinued     1,250 mg 166.7 mL/hr over 90 Minutes Intravenous Every 24 hours 02/02/19  2321 02/03/19 1103   02/03/19 1600  cefTRIAXone (ROCEPHIN) 2 g in sodium chloride 0.9 % 100 mL IVPB     2 g 200 mL/hr over 30 Minutes Intravenous Every 24 hours 02/03/19 1105     02/03/19 0800  ceFEPIme (MAXIPIME) 2 g in sodium chloride 0.9 % 100 mL IVPB  Status:  Discontinued     2 g 200 mL/hr over 30 Minutes Intravenous  Every 12 hours 02/02/19 2317 02/03/19 1103   02/02/19 1700  ceFEPIme (MAXIPIME) 2 g in sodium chloride 0.9 % 100 mL IVPB     2 g 200 mL/hr over 30 Minutes Intravenous  Once 02/02/19 1649 02/02/19 1837   02/02/19 1700  metroNIDAZOLE (FLAGYL) IVPB 500 mg     500 mg 100 mL/hr over 60 Minutes Intravenous  Once 02/02/19 1649 02/02/19 2126   02/02/19 1700  vancomycin (VANCOCIN) IVPB 1000 mg/200 mL premix  Status:  Discontinued     1,000 mg 200 mL/hr over 60 Minutes Intravenous  Once 02/02/19 1649 02/02/19 1653   02/02/19 1700  vancomycin (VANCOCIN) 1,750 mg in sodium chloride 0.9 % 500 mL IVPB     1,750 mg 250 mL/hr over 120 Minutes Intravenous  Once 02/02/19 1653 02/02/19 2011   02/02/19 1645  cefTRIAXone (ROCEPHIN) 1 g in sodium chloride 0.9 % 100 mL IVPB  Status:  Discontinued     1 g 200 mL/hr over 30 Minutes Intravenous  Once 02/02/19 1636 02/02/19 1657      Subjective: States pain better controlled with IV dilaudid  Objective: Vitals:   02/04/19 0553 02/04/19 0913 02/04/19 1312 02/04/19 1328  BP: 100/65 108/71 (!) 87/47 121/75  Pulse: 100 98 (!) 104 (!) 105  Resp: 20  (!) 23 (!) 22  Temp: 99.3 F (37.4 C)  100.2 F (37.9 C)   TempSrc: Oral  Oral   SpO2: 97%   97%  Weight:      Height:        Intake/Output Summary (Last 24 hours) at 02/04/2019 1513 Last data filed at 02/04/2019 1300 Gross per 24 hour  Intake 2694.05 ml  Output 900 ml  Net 1794.05 ml   Filed Weights   02/02/19 1313  Weight: 77.1 kg    Examination: General exam: Conversant, in no acute distress Respiratory system: normal chest rise, clear, no audible wheezing Cardiovascular system: regular rhythm, s1-s2 Gastrointestinal system: Nondistended, nontender, pos BS Central nervous system: No seizures, no tremors Extremities: No cyanosis, no joint deformities Skin: No rashes, no pallor Psychiatry: Affect normal // no auditory hallucinations   Data Reviewed: I have personally reviewed following labs and  imaging studies  CBC: Recent Labs  Lab 02/02/19 1753 02/03/19 0516 02/04/19 0831  WBC 12.6* 10.7* 14.9*  NEUTROABS 11.3*  --   --   HGB 9.0* 8.5* 8.4*  HCT 27.5* 26.2* 25.8*  MCV 67.4* 68.6* 67.9*  PLT 119* 85* 73*   Basic Metabolic Panel: Recent Labs  Lab 02/02/19 1510 02/03/19 0516 02/04/19 0831  NA 136 139 138  K 3.4* 2.7* 3.5  CL 99 106 109  CO2 23 21* 22  GLUCOSE 53* 41* 80  BUN 30* 34* 41*  CREATININE 1.70* 1.71* 2.03*  CALCIUM 7.8* 7.3* 7.7*  MG  --  1.7  --    GFR: Estimated Creatinine Clearance: 36.6 mL/min (A) (by C-G formula based on SCr of 2.03 mg/dL (H)). Liver Function Tests: Recent Labs  Lab 02/02/19 1510 02/03/19 0516  AST 33 29  ALT 18 20  ALKPHOS 117 102  BILITOT 1.9* 1.8*  PROT 5.7* 5.0*  ALBUMIN 2.5* 2.1*   No results for input(s): LIPASE, AMYLASE in the last 168 hours. No results for input(s): AMMONIA in the last 168 hours. Coagulation Profile: Recent Labs  Lab 02/02/19 1753  INR 1.1   Cardiac Enzymes: No results for input(s): CKTOTAL, CKMB, CKMBINDEX, TROPONINI in the last 168 hours. BNP (last 3 results) No results for input(s): PROBNP in the last 8760 hours. HbA1C: No results for input(s): HGBA1C in the last 72 hours. CBG: Recent Labs  Lab 02/03/19 1221 02/03/19 1956 02/04/19 0506 02/04/19 0906 02/04/19 1217  GLUCAP 103* 80 63* 82 79   Lipid Profile: No results for input(s): CHOL, HDL, LDLCALC, TRIG, CHOLHDL, LDLDIRECT in the last 72 hours. Thyroid Function Tests: No results for input(s): TSH, T4TOTAL, FREET4, T3FREE, THYROIDAB in the last 72 hours. Anemia Panel: No results for input(s): VITAMINB12, FOLATE, FERRITIN, TIBC, IRON, RETICCTPCT in the last 72 hours. Sepsis Labs: Recent Labs  Lab 02/02/19 1510 02/02/19 1753 02/02/19 1848  LATICACIDVEN 2.8* 1.7 1.6    Recent Results (from the past 240 hour(s))  SARS Coronavirus 2 [facility] order, Performed in [facility] hospital lab) Nasopharyngeal Nasopharyngeal  Swab     Status: None   Collection Time: 02/02/19  3:10 PM   Specimen: Nasopharyngeal Swab  Result Value Ref Range Status   SARS Coronavirus 2 NEGATIVE NEGATIVE Final    Comment: (NOTE) If result is NEGATIVE SARS-CoV-2 target nucleic acids are NOT DETECTED. The SARS-CoV-2 RNA is generally detectable in upper and lower  respiratory specimens during the acute phase of infection. The lowest  concentration of SARS-CoV-2 viral copies this assay can detect is 250  copies / mL. A negative result does not preclude SARS-CoV-2 infection  and should not be used as the sole basis for treatment or other  patient management decisions.  A negative result may occur with  improper specimen collection / handling, submission of specimen other  than nasopharyngeal swab, presence of viral mutation(s) within the  areas targeted by this assay, and inadequate number of viral copies  (<250 copies / mL). A negative result must be combined with clinical  observations, patient history, and epidemiological information. If result is POSITIVE SARS-CoV-2 target nucleic acids are DETECTED. The SARS-CoV-2 RNA is generally detectable in upper and lower  respiratory specimens dur ing the acute phase of infection.  Positive  results are indicative of active infection with SARS-CoV-2.  Clinical  correlation with patient history and other diagnostic information is  necessary to determine patient infection status.  Positive results do  not rule out bacterial infection or co-infection with other viruses. If result is PRESUMPTIVE POSTIVE SARS-CoV-2 nucleic acids MAY BE PRESENT.   A presumptive positive result was obtained on the submitted specimen  and confirmed on repeat testing.  While 2019 novel coronavirus  (SARS-CoV-2) nucleic acids may be present in the submitted sample  additional confirmatory testing may be necessary for epidemiological  and / or clinical management purposes  to differentiate between  SARS-CoV-2  and other Sarbecovirus currently known to infect humans.  If clinically indicated additional testing with an alternate test  methodology [id number]) is advised. The SARS-CoV-2 RNA is generally  detectable in upper and lower respiratory sp ecimens during the acute  phase of infection. The expected result is Negative. Fact Sheet for Patients:  StrictlyIdeas.no Fact Sheet for Healthcare Providers: BankingDealers.co.za This test is not yet approved or cleared by the [place] FDA and has been  authorized for detection and/or diagnosis of SARS-CoV-2 by FDA under an Emergency Use Authorization (EUA).  This EUA will remain in effect (meaning this test can be used) for the duration of the COVID-19 declaration under Section 564(b)(1) of the Act, 21 U.S.C. section 360bbb-3(b)(1), unless the authorization is terminated or revoked sooner. Performed at [facility], [place] [address] [place], [place] [address]   Urine Culture     Status: Abnormal (Preliminary result)   Collection Time: 02/02/19  3:10 PM   Specimen: Urine, Clean Catch  Result Value Ref Range Status   Specimen Description   Final    URINE, CLEAN CATCH Performed at [facility], [place] [address], [place], [place] [address]    Special Requests   Final    NONE Performed at [facility], [place] [address], [place], [place] [address]    Culture 30,000 COLONIES/mL GRAM NEGATIVE RODS (A)  Final   Report Status PENDING  Incomplete  Blood Culture (routine x 2)     Status: Abnormal (Preliminary result)   Collection Time: 02/02/19  4:53 PM   Specimen: BLOOD LEFT FOREARM  Result Value Ref Range Status   Specimen Description   Final    BLOOD LEFT FOREARM Performed at [place] [address], [place], [place] [address]    Special Requests   Final    BOTTLES DRAWN AEROBIC AND ANAEROBIC Blood Culture adequate  volume Performed at [place] [address], [place], [place] [address]    Culture  Setup Time   Final    GRAM NEGATIVE RODS IN BOTH AEROBIC AND ANAEROBIC BOTTLES CRITICAL RESULT CALLED TO, READ BACK BY AND VERIFIED WITH: PHARMD M LILLISTONE [id number] AT 1021 AM BY CM    Culture (A)  Final    ESCHERICHIA COLI SUSCEPTIBILITIES TO FOLLOW Performed at [facility], [place] [address], [place], [place] [address]    Report Status PENDING  Incomplete  Blood Culture ID Panel (Reflexed)     Status: Abnormal   Collection Time: 02/02/19  4:53 PM  Result Value Ref Range Status   Enterococcus species NOT DETECTED NOT DETECTED Final   Listeria monocytogenes NOT DETECTED NOT DETECTED Final   Staphylococcus species NOT DETECTED NOT DETECTED Final   Staphylococcus aureus (BCID) NOT DETECTED NOT DETECTED Final   Streptococcus species NOT DETECTED NOT DETECTED Final   Streptococcus agalactiae NOT DETECTED NOT DETECTED Final   Streptococcus pneumoniae NOT DETECTED NOT DETECTED Final   Streptococcus pyogenes NOT DETECTED NOT DETECTED Final   Acinetobacter baumannii NOT DETECTED NOT DETECTED Final   Enterobacteriaceae species DETECTED (A) NOT DETECTED Final    Comment: Enterobacteriaceae represent a large family of gram-negative bacteria, not a single organism. CRITICAL RESULT CALLED TO, READ BACK BY AND VERIFIED WITH: PHARMD M LILLISTONE [id number] AT 1022 BY CM    Enterobacter cloacae complex NOT DETECTED NOT DETECTED Final   Escherichia coli DETECTED (A) NOT DETECTED Final    Comment: CRITICAL RESULT CALLED TO, READ BACK BY AND VERIFIED WITH: PHARMD M LILLISTONE [id number] AT 56 AM BY CM    Klebsiella oxytoca NOT DETECTED NOT DETECTED Final   Klebsiella pneumoniae NOT DETECTED NOT DETECTED Final   Proteus species NOT DETECTED NOT DETECTED Final   Serratia marcescens NOT DETECTED NOT DETECTED Final   Carbapenem resistance NOT DETECTED NOT DETECTED Final   Haemophilus influenzae  NOT DETECTED NOT DETECTED Final   Neisseria meningitidis NOT DETECTED NOT DETECTED Final  Pseudomonas aeruginosa NOT DETECTED NOT DETECTED Final   Candida albicans NOT DETECTED NOT DETECTED Final   Candida glabrata NOT DETECTED NOT DETECTED Final   Candida krusei NOT DETECTED NOT DETECTED Final   Candida parapsilosis NOT DETECTED NOT DETECTED Final   Candida tropicalis NOT DETECTED NOT DETECTED Final    Comment: Performed at [facility], [place] [address] [place], [place] [address]  Blood Culture (routine x 2)     Status: None (Preliminary result)   Collection Time: 02/02/19  5:53 PM   Specimen: BLOOD  Result Value Ref Range Status   Specimen Description   Final    BLOOD RIGHT ANTECUBITAL Performed at [place] [address], [place], [place] [address]    Special Requests   Final    BOTTLES DRAWN AEROBIC AND ANAEROBIC Blood Culture results may not be optimal due to an excessive volume of blood received in culture bottles Performed at [place] [address] [place], [place] [address]    Culture  Setup Time   Final    GRAM NEGATIVE RODS IN BOTH AEROBIC AND ANAEROBIC BOTTLES CRITICAL RESULT CALLED TO, READ BACK BY AND VERIFIED WITH: PHARMD M LILLISTONE [id number] AT 1020 AM BY CM Performed at [facility], [place] [address] [place], [place] [address]    Culture GRAM NEGATIVE RODS  Final   Report Status PENDING  Incomplete     Radiology Studies: Ct Abdomen Pelvis Wo Contrast  Result Date: 02/02/2019 CLINICAL DATA:  Shortness of breath, cough and body aches for 3 days, septic with abdominal and back pain EXAM: CT ABDOMEN AND PELVIS WITHOUT CONTRAST TECHNIQUE: Multidetector CT imaging of the abdomen and pelvis was performed following the standard protocol without IV contrast. COMPARISON:  CT 04/06/2018 FINDINGS: Lower chest: Bandlike areas of basilar atelectasis and/or scarring. Normal heart size. No pericardial effusion.  Hepatobiliary: No focal liver abnormality is seen. Patient is post cholecystectomy. Slight prominence of the biliary tree likely related to reservoir effect. No calcified intraductal gallstones. Pancreas: Unremarkable. No pancreatic ductal dilatation or surrounding inflammatory changes. Spleen: Normal in size without focal abnormality. Adrenals/Urinary Tract: Normal adrenal glands. There is bilateral renal enlargement with a heterogeneous attenuation and significant surrounding stranding. No hydronephrosis or obstructive urolithiasis. No visible or contour deforming renal lesions. Urinary bladder is circumferentially thickened. Stomach/Bowel: Patient is post gastric bypass with continued decrease in the amount of material within the excluded portion of the gastric pouch. There is focal dilatation of a small bowel loop adjacent the distal small bowel anastomosis up to 4.3 cm however the adjacent bowel loops are more normal caliber and there is passage of material beyond the level of the anastomosis. No other small bowel dilatation or mural thickening. A normal appendix is visualized. No colonic dilatation or wall thickening. Vascular/Lymphatic: Atherosclerotic plaque within the normal caliber aorta. Further evaluation the vessels is limited by absence of contrast. No suspicious or enlarged lymph nodes in the included lymphatic chains. Reproductive: Normal appearance of the uterus and adnexal structures. Bilateral ligation clips are noted. Other: Extensive perinephric stranding, as above. No free fluid or free gas. No bowel containing hernia. Postsurgical changes are seen in the upper anterior midline abdomen. Musculoskeletal: Multilevel degenerative changes are present in the imaged portions of the spine. Features are most pronounced at L3-4 and L4-5 levels. Postsurgical changes from prior posterior spinal decompression. Partial fragmentation of the L3 spinous process. IMPRESSION: 1. Bilateral renal enlargement with  a heterogeneous attenuation and significant  surrounding stranding with circumferentially thickened bladder wall suggesting an ascending urinary tract infection with pyelonephritis. 2. Focal dilatation of a small bowel loop adjacent the distal small bowel anastomosis however the adjacent bowel loops are more normal caliber and there is passage of material beyond the level of the anastomosis. Findings could reflect normal postsurgical denervation of the bowel loop, focal ileus or partial obstruction. Correlation with symptoms is recommended. 3. Aortic Atherosclerosis (ICD10-I70.0). Electronically Signed   By: Lovena Le M.D.   On: 02/02/2019 20:04   Dg Chest Port 1 View  Result Date: 02/02/2019 CLINICAL DATA:  43 year old female with shortness of breath and cough. EXAM: PORTABLE CHEST 1 VIEW COMPARISON:  Chest radiograph dated 09/13/2017 FINDINGS: The lungs are clear. There is no pleural effusion or pneumothorax. Stable cardiac silhouette. No acute osseous pathology. The IMPRESSION: No active disease. Electronically Signed   By: Anner Crete M.D.   On: 02/02/2019 16:16    Scheduled Meds: . amitriptyline  75 mg Oral QHS  . gabapentin  300 mg Oral TID  . hydrochlorothiazide  12.5 mg Oral Daily  . insulin aspart  0-9 Units Subcutaneous Q4H  . lisinopril  10 mg Oral Daily  . pantoprazole  40 mg Oral Daily   Continuous Infusions: . cefTRIAXone (ROCEPHIN)  IV 2 g (02/03/19 1510)  . dextrose 5 % and 0.9% NaCl 125 mL/hr at 02/04/19 0910     LOS: 2 days   Marylu Lund, MD [facility] Pager On Amion  If 7PM-7AM, please contact night-coverage 02/04/2019, 3:13 PM

## 2019-02-04 NOTE — Progress Notes (Signed)
Pt blood sugar 63 given some juice to bring it up

## 2019-02-05 ENCOUNTER — Inpatient Hospital Stay (HOSPITAL_COMMUNITY): Payer: Medicare Other

## 2019-02-05 DIAGNOSIS — D508 Other iron deficiency anemias: Secondary | ICD-10-CM

## 2019-02-05 DIAGNOSIS — R112 Nausea with vomiting, unspecified: Secondary | ICD-10-CM

## 2019-02-05 DIAGNOSIS — Z8719 Personal history of other diseases of the digestive system: Secondary | ICD-10-CM

## 2019-02-05 LAB — GLUCOSE, CAPILLARY
Glucose-Capillary: 82 mg/dL (ref 70–99)
Glucose-Capillary: 85 mg/dL (ref 70–99)
Glucose-Capillary: 107 mg/dL — ABNORMAL HIGH (ref 70–99)
Glucose-Capillary: 79 mg/dL (ref 70–99)
Glucose-Capillary: 81 mg/dL (ref 70–99)
Glucose-Capillary: 96 mg/dL (ref 70–99)
Glucose-Capillary: 99 mg/dL (ref 70–99)
Glucose-Capillary: 103 mg/dL — ABNORMAL HIGH (ref 70–99)
Glucose-Capillary: 95 mg/dL (ref 70–99)

## 2019-02-05 LAB — CULTURE, BLOOD (ROUTINE X 2): Special Requests: ADEQUATE

## 2019-02-05 LAB — COMPREHENSIVE METABOLIC PANEL WITH GFR
ALT: 16 U/L (ref 0–44)
AST: 23 U/L (ref 15–41)
Albumin: 1.7 g/dL — ABNORMAL LOW (ref 3.5–5.0)
Alkaline Phosphatase: 158 U/L — ABNORMAL HIGH (ref 38–126)
Anion gap: 7 (ref 5–15)
BUN: 36 mg/dL — ABNORMAL HIGH (ref 6–20)
CO2: 21 mmol/L — ABNORMAL LOW (ref 22–32)
Calcium: 7.9 mg/dL — ABNORMAL LOW (ref 8.9–10.3)
Chloride: 111 mmol/L (ref 98–111)
Creatinine, Ser: 1.87 mg/dL — ABNORMAL HIGH (ref 0.44–1.00)
GFR calc Af Amer: 37 mL/min — ABNORMAL LOW
GFR calc non Af Amer: 32 mL/min — ABNORMAL LOW
Glucose, Bld: 70 mg/dL (ref 70–99)
Potassium: 3.5 mmol/L (ref 3.5–5.1)
Sodium: 139 mmol/L (ref 135–145)
Total Bilirubin: 1.1 mg/dL (ref 0.3–1.2)
Total Protein: 4.6 g/dL — ABNORMAL LOW (ref 6.5–8.1)

## 2019-02-05 LAB — FERRITIN: Ferritin: 98 ng/mL (ref 11–307)

## 2019-02-05 LAB — RETICULOCYTES
Immature Retic Fract: 13.3 % (ref 2.3–15.9)
RBC.: 3.6 MIL/uL — ABNORMAL LOW (ref 3.87–5.11)
Retic Count, Absolute: 3.6 10*3/uL — ABNORMAL LOW (ref 19.0–186.0)
Retic Ct Pct: 0.1 % — ABNORMAL LOW (ref 0.4–3.1)

## 2019-02-05 LAB — IRON AND TIBC
Iron: 5 ug/dL — ABNORMAL LOW (ref 28–170)
Saturation Ratios: 2 % — ABNORMAL LOW (ref 10.4–31.8)
TIBC: 206 ug/dL — ABNORMAL LOW (ref 250–450)
UIBC: 201 ug/dL

## 2019-02-05 LAB — CBC
HCT: 24.1 % — ABNORMAL LOW (ref 36.0–46.0)
Hemoglobin: 7.9 g/dL — ABNORMAL LOW (ref 12.0–15.0)
MCH: 21.9 pg — ABNORMAL LOW (ref 26.0–34.0)
MCHC: 32.8 g/dL (ref 30.0–36.0)
MCV: 66.9 fL — ABNORMAL LOW (ref 80.0–100.0)
Platelets: 72 K/uL — ABNORMAL LOW (ref 150–400)
RBC: 3.6 MIL/uL — ABNORMAL LOW (ref 3.87–5.11)
RDW: 18.6 % — ABNORMAL HIGH (ref 11.5–15.5)
WBC: 17.2 K/uL — ABNORMAL HIGH (ref 4.0–10.5)
nRBC: 0 % (ref 0.0–0.2)

## 2019-02-05 LAB — URINE CULTURE: Culture: 30000 — AB

## 2019-02-05 LAB — HEMOGLOBIN A1C
Hgb A1c MFr Bld: 5.2 % (ref 4.8–5.6)
Mean Plasma Glucose: 103 mg/dL

## 2019-02-05 LAB — VITAMIN B12: Vitamin B-12: 766 pg/mL (ref 180–914)

## 2019-02-05 LAB — FOLATE: Folate: 7.6 ng/mL (ref >5.9)

## 2019-02-05 MED ORDER — ONDANSETRON HCL 4 MG PO TABS
4.0000 mg | ORAL_TABLET | Freq: Four times a day (QID) | ORAL | Status: DC
  Administered 2019-02-05 – 2019-02-11 (×19): 4 mg via ORAL
  Filled 2019-02-05 (×19): qty 1

## 2019-02-05 MED ORDER — SODIUM CHLORIDE 0.9 % IV BOLUS
500.0000 mL | Freq: Once | INTRAVENOUS | Status: AC
  Administered 2019-02-05: 500 mL via INTRAVENOUS

## 2019-02-05 MED ORDER — ONDANSETRON HCL 4 MG/2ML IJ SOLN
4.0000 mg | Freq: Four times a day (QID) | INTRAMUSCULAR | Status: DC
  Administered 2019-02-05 – 2019-02-06 (×3): 4 mg via INTRAVENOUS
  Filled 2019-02-05 (×5): qty 2

## 2019-02-05 MED ORDER — PANTOPRAZOLE SODIUM 40 MG IV SOLR
40.0000 mg | INTRAVENOUS | Status: DC
  Administered 2019-02-05: 40 mg via INTRAVENOUS
  Filled 2019-02-05: qty 40

## 2019-02-05 MED ORDER — CEFAZOLIN SODIUM-DEXTROSE 2-4 GM/100ML-% IV SOLN
2.0000 g | Freq: Three times a day (TID) | INTRAVENOUS | Status: DC
  Administered 2019-02-05 – 2019-02-08 (×10): 2 g via INTRAVENOUS
  Filled 2019-02-05 (×11): qty 100

## 2019-02-05 NOTE — Progress Notes (Addendum)
IV became infiltrated, RN tried twice.  Unsuccessful. IV team notified. Patient used a few expletives stating it had been 3 hours since her last Dilaudid.  RN offered to call PCP to try and get an order for oral analgesics. Patient stated the oral meds wouldn't be strong enough. RN reassured patient that IV team would be there directly.

## 2019-02-05 NOTE — Progress Notes (Signed)
SLP Cancellation Note  Patient Details Name: Terri [name] MRN: [id number] DOB: 1975/08/05   Cancelled treatment:       Reason Eval/Treat Not Completed: Medical issues which prohibited therapy(SLP contacted MD re: pt's plan for MBS this am and CT abdomen findings concerns;  MD advised to hold on MBS; note pt now for EGD some time this week and being followed by GI, will follow but will cancel MBS at this time)  Luanna Salk, [facility] SLP [place] Pager [phone] Office [phone]  Macario Golds 02/05/2019, 4:32 PM

## 2019-02-05 NOTE — Progress Notes (Signed)
Patient's Vitals: 101.98F;HR 104;RR24;125/82 (MAP 92);100% Room Air.  PCP was notified

## 2019-02-05 NOTE — Progress Notes (Signed)
PROGRESS NOTE    Terri [name]  [id number] DOB: Jun 12, 1975 DOA: 02/02/2019 PCP: [name], MD    Brief Narrative:  43 y.o. female with medical history significant of depression, bipolar disorder, chronic back pain, hypertension, previous DVT, migraine headaches and previous pneumonia who presented to the ER with complaint of shortness of breath cough generalized weakness.  Patient was seen and evaluated.  She has had some chills headaches with vomiting and diarrhea over the last 4 days.  She has been taking Tylenol and NyQuil but no response.  Patient was found to be mildly hypoxic on arrival and placed on oxygen.  She had elevated lactic acid and symptoms suggestive of sepsis.  Chest x-ray was not revealing.  Urinalysis suggested UTI as possible cause of her sepsis.  Patient is being admitted to the hospital for work-up..  ED Course: Temperature is 99.9 blood pressure 81/52 pulse 115 respirate of 32 oxygen sat 91% on 2 L.  White count is 12.6 hemoglobin 9.1 platelets 119.  Potassium 3.4 creatinine 1.7 BUN 30 and calcium of 7.8.  Lactic acid 2.8.  Urinalysis showed cloudy urine with large leukocytes.  Many bacteria WBC 21-50.  Chest x-ray showed no active disease.  CT abdomen pelvis showed bilateral renal enlargement with heterogeneous attenuation.  Consistent with pyelonephritis.  Patient is being admitted for treatment and early sepsis.  Assessment & Plan:   Principal Problem:   Sepsis (Terri [name]) Active Problems:   Essential hypertension   Leucocytosis   Thrombocytosis (HCC)   UTI (urinary tract infection)   #1 Sepsis with ecoli bacteremia and pyelonephritis present on admit:  - Presenting UA suggestive of UTI. Urine and blood cultures pos for pan-sensitive ecoli -Discussed with Pharmacy, abx transitioned to IV ancef -B pyelo on CT abd. Afebrile. WBC continues to trend up to over 17k today -Will recheck CBC in AM -CXR personally reviewed. No florid pna, however concern for pulm  edema, see below  #2 hypertension:  - BP stable at this time - Currently hemodynamically stable - On IVF, but will decrease given developing pulm edema above  #3 thrombocytopenia:  -Suspect secondary to acute infection and sepsis -Labs reviewed, plts continues to worsen, to 72 -Will repeat CBC in AM  #4 acute pyelonephritis:  -Reviewed on CT per above -Continue treatment with IV antibiotics as above. -Pain stable thus far with dilaudid  #5 acute on chronic low back pain:  -Intractable pain, not improved with home neurontin. Likely worse secondary to CVA tenderness from pyelo -Seems better controlled with dilaudid  #6 depression with anxiety:  -Continued on home regimen and continue counseling  #7 shortness of breath:  -Patient described abd pain when taking full breath, likely contributing to "shortness of breath" -Cont to optimize analgesia -seems improved  #8 ARF -Cr peaked to 2.03 -Clinically dehydrated  -Cr now improving with IVF hydration  #9 Dysphagia -New issue this admit -Concerns for possible obstruction s/p prior gastric bypass -Have consulted GI. Recommendation for possible EGD later in the week when more stable   DVT prophylaxis: SCD's Code Status: Full Family Communication: Pt in room, family not at bedside Disposition Plan: Uncertain at this time  Consultants:   GI  Procedures:     Antimicrobials: Anti-infectives (From admission, onward)   Start     Dose/Rate Route Frequency Ordered Stop   02/05/19 1400  ceFAZolin (ANCEF) IVPB 2g/100 mL premix     2 g 200 mL/hr over 30 Minutes Intravenous Every 8 hours 02/05/19 1136  02/03/19 1800  vancomycin (VANCOCIN) 1,250 mg in sodium chloride 0.9 % 250 mL IVPB  Status:  Discontinued     1,250 mg 166.7 mL/hr over 90 Minutes Intravenous Every 24 hours 02/02/19 2321 02/03/19 1103   02/03/19 1600  cefTRIAXone (ROCEPHIN) 2 g in sodium chloride 0.9 % 100 mL IVPB  Status:  Discontinued     2 g 200  mL/hr over 30 Minutes Intravenous Every 24 hours 02/03/19 1105 02/05/19 1136   02/03/19 0800  ceFEPIme (MAXIPIME) 2 g in sodium chloride 0.9 % 100 mL IVPB  Status:  Discontinued     2 g 200 mL/hr over 30 Minutes Intravenous Every 12 hours 02/02/19 2317 02/03/19 1103   02/02/19 1700  ceFEPIme (MAXIPIME) 2 g in sodium chloride 0.9 % 100 mL IVPB     2 g 200 mL/hr over 30 Minutes Intravenous  Once 02/02/19 1649 02/02/19 1837   02/02/19 1700  metroNIDAZOLE (FLAGYL) IVPB 500 mg     500 mg 100 mL/hr over 60 Minutes Intravenous  Once 02/02/19 1649 02/02/19 2126   02/02/19 1700  vancomycin (VANCOCIN) IVPB 1000 mg/200 mL premix  Status:  Discontinued     1,000 mg 200 mL/hr over 60 Minutes Intravenous  Once 02/02/19 1649 02/02/19 1653   02/02/19 1700  vancomycin (VANCOCIN) 1,750 mg in sodium chloride 0.9 % 500 mL IVPB     1,750 mg 250 mL/hr over 120 Minutes Intravenous  Once 02/02/19 1653 02/02/19 2011   02/02/19 1645  cefTRIAXone (ROCEPHIN) 1 g in sodium chloride 0.9 % 100 mL IVPB  Status:  Discontinued     1 g 200 mL/hr over 30 Minutes Intravenous  Once 02/02/19 1636 02/02/19 1657      Subjective: Still complaining of difficulty swallowing  Objective: Vitals:   02/05/19 0425 02/05/19 0815 02/05/19 1332 02/05/19 1515  BP: (!) 90/54 116/72 135/77 131/65  Pulse: 95 94 (!) 109 (!) 114  Resp: 18 20 (!) 24 (!) 24  Temp: 99.1 F (37.3 C) 98.9 F (37.2 C) (!) 100.6 F (38.1 C) 100 F (37.8 C)  TempSrc: Oral Oral Oral Oral  SpO2: 99% 95% 97% 97%  Weight:      Height:        Intake/Output Summary (Last 24 hours) at 02/05/2019 1738 Last data filed at 02/05/2019 1400 Gross per 24 hour  Intake 3396.89 ml  Output 1600 ml  Net 1796.89 ml   Filed Weights   02/02/19 1313  Weight: 77.1 kg    Examination: General exam: Awake, laying in bed, in nad Respiratory system: Normal respiratory effort, no wheezing Cardiovascular system: regular rate, s1, s2 Gastrointestinal system: Soft,  nondistended, positive BS Central nervous system: CN2-12 grossly intact, strength intact Extremities: Perfused, no clubbing Skin: Normal skin turgor, no notable skin lesions seen Psychiatry: Mood normal // no visual hallucinations   Data Reviewed: I have personally reviewed following labs and imaging studies  CBC: Recent Labs  Lab 02/02/19 1753 02/03/19 0516 02/04/19 0831 02/05/19 0610  WBC 12.6* 10.7* 14.9* 17.2*  NEUTROABS 11.3*  --   --   --   HGB 9.0* 8.5* 8.4* 7.9*  HCT 27.5* 26.2* 25.8* 24.1*  MCV 67.4* 68.6* 67.9* 66.9*  PLT 119* 85* 73* 72*   Basic Metabolic Panel: Recent Labs  Lab 02/02/19 1510 02/03/19 0516 02/04/19 0831 02/05/19 0610  NA 136 139 138 139  K 3.4* 2.7* 3.5 3.5  CL 99 106 109 111  CO2 23 21* 22 21*  GLUCOSE 53* 41* 80  70  BUN 30* 34* 41* 36*  CREATININE 1.70* 1.71* 2.03* 1.87*  CALCIUM 7.8* 7.3* 7.7* 7.9*  MG  --  1.7  --   --    GFR: Estimated Creatinine Clearance: 39.7 mL/min (A) (by C-G formula based on SCr of 1.87 mg/dL (H)). Liver Function Tests: Recent Labs  Lab 02/02/19 1510 02/03/19 0516 02/05/19 0610  AST 33 29 23  ALT 18 20 16   ALKPHOS 117 102 158*  BILITOT 1.9* 1.8* 1.1  PROT 5.7* 5.0* 4.6*  ALBUMIN 2.5* 2.1* 1.7*   No results for input(s): LIPASE, AMYLASE in the last 168 hours. No results for input(s): AMMONIA in the last 168 hours. Coagulation Profile: Recent Labs  Lab 02/02/19 1753  INR 1.1   Cardiac Enzymes: No results for input(s): CKTOTAL, CKMB, CKMBINDEX, TROPONINI in the last 168 hours. BNP (last 3 results) No results for input(s): PROBNP in the last 8760 hours. HbA1C: Recent Labs    02/03/19 0515  HGBA1C 5.2   CBG: Recent Labs  Lab 02/04/19 2346 02/05/19 0422 02/05/19 0743 02/05/19 1117 02/05/19 1554  GLUCAP 99 82 85 79 103*   Lipid Profile: No results for input(s): CHOL, HDL, LDLCALC, TRIG, CHOLHDL, LDLDIRECT in the last 72 hours. Thyroid Function Tests: No results for input(s): TSH,  T4TOTAL, FREET4, T3FREE, THYROIDAB in the last 72 hours. Anemia Panel: No results for input(s): VITAMINB12, FOLATE, FERRITIN, TIBC, IRON, RETICCTPCT in the last 72 hours. Sepsis Labs: Recent Labs  Lab 02/02/19 1510 02/02/19 1753 02/02/19 1848  LATICACIDVEN 2.8* 1.7 1.6    Recent Results (from the past 240 hour(s))  SARS Coronavirus 2 [facility] order, Performed in [facility] hospital lab) Nasopharyngeal Nasopharyngeal Swab     Status: None   Collection Time: 02/02/19  3:10 PM   Specimen: Nasopharyngeal Swab  Result Value Ref Range Status   SARS Coronavirus 2 NEGATIVE NEGATIVE Final    Comment: (NOTE) If result is NEGATIVE SARS-CoV-2 target nucleic acids are NOT DETECTED. The SARS-CoV-2 RNA is generally detectable in upper and lower  respiratory specimens during the acute phase of infection. The lowest  concentration of SARS-CoV-2 viral copies this assay can detect is 250  copies / mL. A negative result does not preclude SARS-CoV-2 infection  and should not be used as the sole basis for treatment or other  patient management decisions.  A negative result may occur with  improper specimen collection / handling, submission of specimen other  than nasopharyngeal swab, presence of viral mutation(s) within the  areas targeted by this assay, and inadequate number of viral copies  (<250 copies / mL). A negative result must be combined with clinical  observations, patient history, and epidemiological information. If result is POSITIVE SARS-CoV-2 target nucleic acids are DETECTED. The SARS-CoV-2 RNA is generally detectable in upper and lower  respiratory specimens dur ing the acute phase of infection.  Positive  results are indicative of active infection with SARS-CoV-2.  Clinical  correlation with patient history and other diagnostic information is  necessary to determine patient infection status.  Positive results do  not rule out bacterial infection or co-infection with other  viruses. If result is PRESUMPTIVE POSTIVE SARS-CoV-2 nucleic acids MAY BE PRESENT.   A presumptive positive result was obtained on the submitted specimen  and confirmed on repeat testing.  While 2019 novel coronavirus  (SARS-CoV-2) nucleic acids may be present in the submitted sample  additional confirmatory testing may be necessary for epidemiological  and / or clinical management purposes  to differentiate  between  SARS-CoV-2 and other Sarbecovirus currently known to infect humans.  If clinically indicated additional testing with an alternate test  methodology [phone]) is advised. The SARS-CoV-2 RNA is generally  detectable in upper and lower respiratory sp ecimens during the acute  phase of infection. The expected result is Negative. Fact Sheet for Patients:  StrictlyIdeas.no Fact Sheet for Healthcare Providers: BankingDealers.co.za This test is not yet approved or cleared by the [place] FDA and has been authorized for detection and/or diagnosis of SARS-CoV-2 by FDA under an Emergency Use Authorization (EUA).  This EUA will remain in effect (meaning this test can be used) for the duration of the COVID-19 declaration under Section 564(b)(1) of the Act, 21 U.S.C. section 360bbb-3(b)(1), unless the authorization is terminated or revoked sooner. Performed at [facility], [place] [address], [place], [place] [address]   Urine Culture     Status: Abnormal   Collection Time: 02/02/19  3:10 PM   Specimen: Urine, Clean Catch  Result Value Ref Range Status   Specimen Description   Final    URINE, CLEAN CATCH Performed at [name], [place] [address], [place], [place] [address]    Special Requests   Final    NONE Performed at [facility], [place] [address], [place], [place] [address]    Culture 30,000 COLONIES/mL ESCHERICHIA COLI (A)  Final   Report Status 02/05/2019 FINAL  Final    Organism ID, Bacteria ESCHERICHIA COLI (A)  Final      Susceptibility   Escherichia coli - MIC*    AMPICILLIN <=2 SENSITIVE Sensitive     CEFAZOLIN <=4 SENSITIVE Sensitive     CEFTRIAXONE <=1 SENSITIVE Sensitive     CIPROFLOXACIN <=0.25 SENSITIVE Sensitive     GENTAMICIN <=1 SENSITIVE Sensitive     IMIPENEM <=0.25 SENSITIVE Sensitive     NITROFURANTOIN <=16 SENSITIVE Sensitive     TRIMETH/SULFA <=20 SENSITIVE Sensitive     AMPICILLIN/SULBACTAM <=2 SENSITIVE Sensitive     PIP/TAZO <=4 SENSITIVE Sensitive     Extended ESBL NEGATIVE Sensitive     * 30,000 COLONIES/mL ESCHERICHIA COLI  Blood Culture (routine x 2)     Status: Abnormal   Collection Time: 02/02/19  4:53 PM   Specimen: BLOOD LEFT FOREARM  Result Value Ref Range Status   Specimen Description   Final    BLOOD LEFT FOREARM Performed at [facility] [address], [place], [place] [address]    Special Requests   Final    BOTTLES DRAWN AEROBIC AND ANAEROBIC Blood Culture adequate volume Performed at [facility] [address] [place], [place] [address]    Culture  Setup Time   Final    GRAM NEGATIVE RODS IN BOTH AEROBIC AND ANAEROBIC BOTTLES CRITICAL RESULT CALLED TO, READ BACK BY AND VERIFIED WITH: PHARMD M LILLISTONE [id number] AT 1021 AM BY CM Performed at [facility], [place] [address], [place], [place] [address]    Culture ESCHERICHIA COLI (A)  Final   Report Status 02/05/2019 FINAL  Final   Organism ID, Bacteria ESCHERICHIA COLI  Final      Susceptibility   Escherichia coli - MIC*    AMPICILLIN <=2 SENSITIVE Sensitive     CEFAZOLIN <=4 SENSITIVE Sensitive     CEFEPIME <=1 SENSITIVE Sensitive     CEFTAZIDIME <=1 SENSITIVE Sensitive     CEFTRIAXONE <=1 SENSITIVE Sensitive     CIPROFLOXACIN <=0.25 SENSITIVE Sensitive     GENTAMICIN <=  1 SENSITIVE Sensitive     IMIPENEM <=0.25 SENSITIVE Sensitive     TRIMETH/SULFA <=20 SENSITIVE Sensitive     AMPICILLIN/SULBACTAM <=2  SENSITIVE Sensitive     PIP/TAZO <=4 SENSITIVE Sensitive     Extended ESBL NEGATIVE Sensitive     * ESCHERICHIA COLI  Blood Culture ID Panel (Reflexed)     Status: Abnormal   Collection Time: 02/02/19  4:53 PM  Result Value Ref Range Status   Enterococcus species NOT DETECTED NOT DETECTED Final   Listeria monocytogenes NOT DETECTED NOT DETECTED Final   Staphylococcus species NOT DETECTED NOT DETECTED Final   Staphylococcus aureus (BCID) NOT DETECTED NOT DETECTED Final   Streptococcus species NOT DETECTED NOT DETECTED Final   Streptococcus agalactiae NOT DETECTED NOT DETECTED Final   Streptococcus pneumoniae NOT DETECTED NOT DETECTED Final   Streptococcus pyogenes NOT DETECTED NOT DETECTED Final   Acinetobacter baumannii NOT DETECTED NOT DETECTED Final   Enterobacteriaceae species DETECTED (A) NOT DETECTED Final    Comment: Enterobacteriaceae represent a large family of gram-negative bacteria, not a single organism. CRITICAL RESULT CALLED TO, READ BACK BY AND VERIFIED WITH: PHARMD M LILLISTONE [id number] AT 1022 BY CM    Enterobacter cloacae complex NOT DETECTED NOT DETECTED Final   Escherichia coli DETECTED (A) NOT DETECTED Final    Comment: CRITICAL RESULT CALLED TO, READ BACK BY AND VERIFIED WITH: PHARMD M LILLISTONE [id number] AT 63 AM BY CM    Klebsiella oxytoca NOT DETECTED NOT DETECTED Final   Klebsiella pneumoniae NOT DETECTED NOT DETECTED Final   Proteus species NOT DETECTED NOT DETECTED Final   Serratia marcescens NOT DETECTED NOT DETECTED Final   Carbapenem resistance NOT DETECTED NOT DETECTED Final   Haemophilus influenzae NOT DETECTED NOT DETECTED Final   Neisseria meningitidis NOT DETECTED NOT DETECTED Final   Pseudomonas aeruginosa NOT DETECTED NOT DETECTED Final   Candida albicans NOT DETECTED NOT DETECTED Final   Candida glabrata NOT DETECTED NOT DETECTED Final   Candida krusei NOT DETECTED NOT DETECTED Final   Candida parapsilosis NOT DETECTED NOT DETECTED Final    Candida tropicalis NOT DETECTED NOT DETECTED Final    Comment: Performed at [facility], [place] [address] [place], [place] [address]  Blood Culture (routine x 2)     Status: Abnormal   Collection Time: 02/02/19  5:53 PM   Specimen: BLOOD  Result Value Ref Range Status   Specimen Description   Final    BLOOD RIGHT ANTECUBITAL Performed at [facility] [address] [place], [place] [address]    Special Requests   Final    BOTTLES DRAWN AEROBIC AND ANAEROBIC Blood Culture results may not be optimal due to an excessive volume of blood received in culture bottles Performed at [facility] [address], [place], [place] [address]    Culture  Setup Time   Final    GRAM NEGATIVE RODS IN BOTH AEROBIC AND ANAEROBIC BOTTLES CRITICAL RESULT CALLED TO, READ BACK BY AND VERIFIED WITH: PHARMD M LILLISTONE [id number] AT 80 AM BY CM    Culture (A)  Final    ESCHERICHIA COLI SUSCEPTIBILITIES PERFORMED ON PREVIOUS CULTURE WITHIN THE LAST 5 DAYS. Performed at [facility], [place] [address] [place], [place] [address]    Report Status 02/05/2019 FINAL  Final     Radiology Studies: Dg Chest 2 View  Result Date: 02/05/2019 CLINICAL DATA:  43 year old female with a history of shortness of breath EXAM: CHEST - 2 VIEW  COMPARISON:  February 02, 2019 FINDINGS: Cardiomediastinal silhouette unchanged in size and contour. New interlobular septal thickening compared to the prior with linear opacities of the bilateral lungs and thickening of the minor fissure. No pneumothorax. No pleural effusion. No confluent airspace disease. IMPRESSION: Evidence of early pulmonary edema. Electronically Signed   By: Corrie Mckusick D.O.   On: 02/05/2019 16:22    Scheduled Meds: . amitriptyline  75 mg Oral QHS  . gabapentin  300 mg Oral TID  . hydrochlorothiazide  12.5 mg Oral Daily  . insulin aspart  0-9 Units Subcutaneous Q4H  . lisinopril  10 mg Oral Daily  . magic  mouthwash  10 mL Oral QID  . ondansetron (ZOFRAN) IV  4 mg Intravenous Q6H   Or  . ondansetron  4 mg Oral Q6H  . pantoprazole (PROTONIX) IV  40 mg Intravenous Q24H   Continuous Infusions: .  ceFAZolin (ANCEF) IV 2 g (02/05/19 1323)  . dextrose 5 % and 0.9% NaCl 125 mL/hr at 02/05/19 1240     LOS: 3 days   Marylu Lund, MD [facility] Pager On Amion  If 7PM-7AM, please contact night-coverage 02/05/2019, 5:38 PM

## 2019-02-05 NOTE — Care Management Important Message (Signed)
Important Message  Patient Details IM Letter given to Rhea Pink SW to present to the Patient Name: Terri [name] MRN: [id number] Date of Birth: 1976/01/20   Medicare Important Message Given:  Yes     Kerin Salen 02/05/2019, 11:20 AM

## 2019-02-05 NOTE — Consult Note (Signed)
Consultation  Referring Provider: [facility]/ Dr Earlie Counts Primary Care Physician:  [name], MD Primary Gastroenterologist:  None/unassigned  Reason for Consultation:  Nausea/ vomiting/abdominal pain  HPI: Terri [name] is a 43 y.o. female, admitted on 02/02/2019 with complaints of cough and shortness of breath x4 days, as well as generalized weakness.  She had also complained of headache, vomiting and a few episodes of diarrhea.  She was found to be mildly hypoxic on arrival by EMS and was started on oxygen.  In the ER lactate elevated, symptoms were suggestive of sepsis, probable UTI she was started on IV antibiotics. Patient has history of bipolar disorder, prior DVT, she status post cholecystectomy, lysis of adhesions, and has history of chronic back pain CT of the abdomen and pelvis showed bilateral renal enlargement, patient is status post gastric bypass, and there was focal dilation of a loop of small bowel adjacent to the distal small bowel anastomosis is ring up to 4.3 cm however the adjacent small bowel loops were more normal caliber and material did pass beyond the level of the anastomosis without difficulty. Chest x-ray on admission unrevealing. Blood cultures were positive for E. coli and Enterobacter.  She is currently on Rocephin. She continues to feel poorly, and tells me she is feeling more short of breath today.  She is tachycardic this afternoon and has temp of 100.6 currently. She has been coughing.  She is not a great historian, was moaning some when I came into the room.  She is complaining of nausea as well.  She says she had been vomiting off and on at home and at times would vomit up "pieces" of food.  Overall her weight has been stable over the past few months.  She has had some epigastric/subxiphoid pain, no heartburn or indigestion.  She gives vague history of dysphasia but then points to her epigastrium as area where her food "stops".  No odynophagia. No regular aspirin  or NSAID use.  Patient did have a EGD in April 2019 by [facility] done at that time for iron deficiency anemia and was found to be status post gastric bypass with a normal-appearing anastomosis.  Labs today, WBC up to 17.2, was 10.7  ,2 days ago Hemoglobin 7.9, MCV 66, platelets 72 Creatinine 1.87 Albumin 1.7 Alk phos 158    Past Medical History:  Diagnosis Date  . Arthritis    Back and legs  . Bipolar disorder ([place])   . Chronic back pain   . Depression   . DVT (deep venous thrombosis) ([facility])    early 20's leg  . History of blood transfusion   . Hypertension   . Migraine headache   . Neuropathy   . Pneumonia   . 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 Sciatica   . Stress incontinence     Past Surgical History:  Procedure Laterality Date  . ALVEOLOPLASTY Bilateral 01/31/2018   Procedure: ALVEOLOPLASTY;  Surgeon: Diona Browner, DDS;  Location: [facility];  Service: Oral Surgery;  Laterality: Bilateral;  . ANKLE SURGERY Left 1992   with hardware  . BACK SURGERY    . CESAREAN SECTION    . CHOLECYSTECTOMY N/A 02/15/2017   Procedure: LAPAROSCOPIC CHOLECYSTECTOMY;  Surgeon: Clovis Riley, MD;  Location: [facility];  Service: General;  Laterality: N/A;  . ESOPHAGOGASTRODUODENOSCOPY (EGD) WITH PROPOFOL N/A 09/15/2017   Procedure: ESOPHAGOGASTRODUODENOSCOPY (EGD) WITH PROPOFOL;  Surgeon: Clarene Essex, MD;  Location: [facility];  Service: Endoscopy;  Laterality: N/A;  . GASTRIC BYPASS    . INCISION AND DRAINAGE OF PERITONSILLAR ABCESS Left 08/13/2017   Procedure: INCISION AND DRAINAGE OF LEFT NECK ABSCESS;  Surgeon: Melida Quitter, MD;  Location: [facility];  Service: ENT;  Laterality: Left;  . LAPAROSCOPIC LYSIS OF ADHESIONS  02/15/2017   Procedure: LAPAROSCOPIC LYSIS OF ADHESIONS;  Surgeon: Clovis Riley, MD;  Location: [facility];  Service: General;;  . LUMBAR [place]    . LUMBAR FUSION    . TOOTH EXTRACTION Bilateral 01/31/2018   Procedure: DENTAL RESTORATION/EXTRACTIONS;  Surgeon:  Diona Browner, DDS;  Location: [facility];  Service: Oral Surgery;  Laterality: Bilateral;  . TUBAL LIGATION    . UPPER GI ENDOSCOPY N/A 02/15/2017   Procedure: UPPER GI ENDOSCOPY;  Surgeon: Clovis Riley, MD;  Location: [facility];  Service: General;  Laterality: N/A;    Prior to Admission medications   Medication Sig Start Date End Date Taking? Authorizing Provider  albuterol (PROVENTIL HFA;VENTOLIN HFA) 108 (90 BASE) MCG/ACT inhaler Inhale 1-2 puffs into the lungs every 4 (four) hours as needed for wheezing or shortness of breath. 03/07/15  Yes Pisciotta, Elmyra Ricks, PA-C  amitriptyline (ELAVIL) 75 MG tablet Take 75 mg by mouth at bedtime.   Yes [provider]  gabapentin (NEURONTIN) 300 MG capsule Take 300 mg by mouth 3 (three) times daily.   Yes [provider]  ibuprofen (ADVIL,MOTRIN) 200 MG tablet Take 200 mg by mouth every 6 (six) hours as needed for moderate pain.   Yes [provider]  lisinopril-hydrochlorothiazide (PRINZIDE,ZESTORETIC) 10-12.5 MG tablet Take 1 tablet by mouth daily. 01/22/17  Yes [provider]  acetaminophen (TYLENOL) 500 MG tablet Take 1 tablet (500 mg total) by mouth every 6 (six) hours as needed for moderate pain. Patient not taking: Reported on 05/17/2018 01/31/18   Diona Browner, DDS  amoxicillin (AMOXIL) 500 MG capsule Take 1 capsule (500 mg total) by mouth 3 (three) times daily. Patient not taking: Reported on 04/02/2018 01/31/18   Diona Browner, DDS  oxyCODONE-acetaminophen (PERCOCET) 5-325 MG tablet Take 1 tablet by mouth every 4 (four) hours as needed. Patient not taking: Reported on 04/02/2018 01/31/18   Diona Browner, DDS  pantoprazole (PROTONIX) 40 MG tablet Take 1 tablet (40 mg total) by mouth daily. 09/15/17 04/02/18  Arrien, Jimmy Picket, MD  polyethylene glycol [facility]) packet Take 17 g by mouth daily. Patient not taking: Reported on 05/17/2018 04/02/18   Hayden Rasmussen, MD  traMADol (ULTRAM) 50 MG tablet Take 1 tablet  (50 mg total) by mouth every 6 (six) hours as needed. Patient not taking: Reported on 04/02/2018 10/11/17   Dorie Rank, MD    Current Facility-Administered Medications  Medication Dose Route Frequency Provider Last Rate Last Dose  . acetaminophen (TYLENOL) tablet 650 mg  650 mg Oral Q6H PRN Zierle-Ghosh, Asia B, DO   650 mg at 02/04/19 2132  . albuterol (PROVENTIL) (2.5 MG/3ML) 0.083% nebulizer solution 2.5 mg  2.5 mg Nebulization Q6H PRN Gala Romney L, MD      . amitriptyline (ELAVIL) tablet 75 mg  75 mg Oral QHS Elwyn Reach, MD   75 mg at 02/04/19 2133  . ceFAZolin (ANCEF) IVPB 2g/100 mL premix  2 g Intravenous Q8H Donne Hazel, MD 200 mL/hr at 02/05/19 1323 2 g at 02/05/19 1323  . dextrose 5 %-0.9 % sodium chloride infusion   Intravenous Continuous Donne Hazel, MD 125 mL/hr at 02/05/19 1240    . gabapentin (NEURONTIN) capsule  300 mg  300 mg Oral TID Elwyn Reach, MD   300 mg at 02/05/19 0912  . hydrochlorothiazide (MICROZIDE) capsule 12.5 mg  12.5 mg Oral Daily Gala Romney L, MD   12.5 mg at 02/05/19 0913  . HYDROmorphone (DILAUDID) injection 1 mg  1 mg Intravenous Q3H PRN Donne Hazel, MD   1 mg at 02/05/19 1214  . insulin aspart (novoLOG) injection 0-9 Units  0-9 Units Subcutaneous Q4H Donne Hazel, MD      . lisinopril (ZESTRIL) tablet 10 mg  10 mg Oral Daily Elwyn Reach, MD   10 mg at 02/05/19 0918  . magic mouthwash  10 mL Oral QID Donne Hazel, MD   10 mL at 02/05/19 1324  . ondansetron (ZOFRAN) tablet 4 mg  4 mg Oral Q6H PRN Elwyn Reach, MD       Or  . ondansetron (ZOFRAN) injection 4 mg  4 mg Intravenous Q6H PRN Gala Romney L, MD      . pantoprazole (PROTONIX) EC tablet 40 mg  40 mg Oral Daily Elwyn Reach, MD   40 mg at 02/05/19 0914  . phenol (CHLORASEPTIC) mouth spray 1 spray  1 spray Mouth/Throat PRN Donne Hazel, MD   1 spray at 02/04/19 1318    Allergies as of 02/02/2019 - Review Complete 02/02/2019  Allergen Reaction  Noted  . Ketorolac tromethamine Itching and Nausea And Vomiting 12/06/2010  . Methocarbamol Diarrhea 03/22/2012    Family History  Problem Relation Age of Onset  . Diabetes Mother   . Hypertension Mother   . Diabetes Father   . Hypertension Father     Social History   Socioeconomic History  . Marital status: Divorced    Spouse name: Not on file  . Number of children: Not on file  . Years of education: Not on file  . Highest education level: Not on file  Occupational History  . Not on file  Social Needs  . Financial resource strain: Not on file  . Food insecurity    Worry: Not on file    Inability: Not on file  . Transportation needs    Medical: Not on file    Non-medical: Not on file  Tobacco Use  . Smoking status: Current Every Day Smoker    Packs/day: 1.00    Years: 29.00    Pack years: 29.00    Types: Cigarettes  . Smokeless tobacco: Never Used  Substance and Sexual Activity  . Alcohol use: No  . 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 Sexual activity: Yes    Birth control/protection: None  Lifestyle  . Physical activity    Days per week: Not on file    Minutes per session: Not on file  . Stress: Not on file  Relationships  . Social Herbalist on phone: Not on file    Gets together: Not on file    Attends religious service: Not on file    Active member of club or organization: Not on file    Attends meetings of clubs or organizations: Not on file    Relationship status: Not on file  . Intimate partner violence    Fear of current or ex partner: Not on file    Emotionally abused: Not on file    Physically abused: Not on file    Forced sexual activity: Not on file  Other Topics Concern  . Not on file  Social History Narrative  . Not on file    Review of Systems: Pertinent positive and negative review of systems were noted in the above HPI section.  All other review of systems was otherwise  negative.  Physical Exam: Vital signs in last 24 hours: Temp:  [98.9 F (37.2 C)-101.8 F (38.8 C)] 98.9 F (37.2 C) (09/14 0815) Pulse Rate:  [94-118] 94 (09/14 0815) Resp:  [18-20] 20 (09/14 0815) BP: (90-121)/(54-76) 116/72 (09/14 0815) SpO2:  [92 %-99 %] 95 % (09/14 0815) Last BM Date: 01/31/19 General:   Alert,  Well-developed,chronically ill appearing WF , on O2 at  liters well-nourished, pleasant and cooperative in NAD Head:  Normocephalic and atraumatic. Eyes:  Sclera clear, no icterus.   Conjunctiva pink. Ears:  Normal auditory acuity. Nose:  No deformity, discharge,  or lesions. Mouth:  No deformity or lesions.   Neck:  Supple; no masses or thyromegaly. Lungs:   Bilateral Rhonchi  Heart:  Regular rate and rhythm; no murmurs, clicks, rubs,  or gallops. Abdomen:  Soft,tender epigastrium , BS active,nonpalp mass or hsm., no  splash  Rectal:  Deferred  Msk:  Symmetrical without gross deformities. . Pulses:  Normal pulses noted. Extremities:  Without clubbing or edema. Neurologic:  Alert and  oriented x4;  grossly normal neurologically. Skin:  Intact without significant lesions or rashes.. Psych:  Alert and cooperative. Normal mood and affect.  Intake/Output from previous day: 09/13 0701 - 09/14 0700 In: 3638.9 [P.O.:900; I.V.:2638.9; IV Piggyback:100] Out: 600 [Urine:600] Intake/Output this shift: Total I/O In: 616 [P.O.:120; I.V.:496] Out: 1600 [Urine:1600]  Lab Results: Recent Labs    02/03/19 0516 02/04/19 0831 02/05/19 0610  WBC 10.7* 14.9* 17.2*  HGB 8.5* 8.4* 7.9*  HCT 26.2* 25.8* 24.1*  PLT 85* 73* 72*   BMET Recent Labs    02/03/19 0516 02/04/19 0831 02/05/19 0610  NA 139 138 139  K 2.7* 3.5 3.5  CL 106 109 111  CO2 21* 22 21*  GLUCOSE 41* 80 70  BUN 34* 41* 36*  CREATININE 1.71* 2.03* 1.87*  CALCIUM 7.3* 7.7* 7.9*   LFT Recent Labs    02/05/19 0610  PROT 4.6*  ALBUMIN 1.7*  AST 23  ALT 16  ALKPHOS 158*  BILITOT 1.1    PT/INR Recent Labs    02/02/19 1753  LABPROT 14.5  INR 1.1   Hepatitis Panel No results for input(s): HEPBSAG, HCVAB, HEPAIGM, HEPBIGM in the last 72 hours.    IMPRESSION:  #10 43 year old white female chronically ill-appearing, who we are asked to see for complaints of nausea and vomiting and abnormal CT on admission with 1 focally dilated small bowel loop adjacent to anastomosis from gastric bypass.  Patient has had some chronic intermittent nausea and vomiting, worse over the past week and is becoming acutely ill. Etiology is not clear.  Rule out component of gastroparesis, doubt partial outlet obstruction but cannot rule out.  #2 urosepsis-with acute nausea and vomiting. #3 acute kidney injury secondary to urosepsis #4 anemia with progressive drift in hemoglobin, in patient with history of iron deficiency. #5 thrombocytopenia new since admission likely related to sepsis #6 recurrent fever, worsening leukocytosis and complaints of cough and dyspnea.  Rule out hospital-acquired pneumonia versus other  #7 bipolar disorder #8 status post Roux-en-Y gastric bypass 2012, status post cholecystectomy, status post lysis of adhesions   PLAN: #1 chest x-ray PA lateral today #2 change Zofran to around-the-clock #3 change Protonix  to IV #4 patient will need EGD at some point this week, but would like to sort out fever, leukocytosis etc. First #5 change diet to clear liquid #6 iron studies-she may need IV Feraheme, and will be unable to absorb oral iron status post Roux-en-Y gastric bypass.  Thank you will follow with you     Haygen Zebrowski EsterwoodPA-C  02/05/2019, 1:29 PM

## 2019-02-06 DIAGNOSIS — R1310 Dysphagia, unspecified: Secondary | ICD-10-CM

## 2019-02-06 LAB — GLUCOSE, CAPILLARY
Glucose-Capillary: 101 mg/dL — ABNORMAL HIGH (ref 70–99)
Glucose-Capillary: 139 mg/dL — ABNORMAL HIGH (ref 70–99)
Glucose-Capillary: 68 mg/dL — ABNORMAL LOW (ref 70–99)
Glucose-Capillary: 83 mg/dL (ref 70–99)
Glucose-Capillary: 86 mg/dL (ref 70–99)
Glucose-Capillary: 89 mg/dL (ref 70–99)

## 2019-02-06 MED ORDER — PANTOPRAZOLE SODIUM 40 MG IV SOLR
40.0000 mg | Freq: Two times a day (BID) | INTRAVENOUS | Status: DC
  Administered 2019-02-06 – 2019-02-11 (×11): 40 mg via INTRAVENOUS
  Filled 2019-02-06 (×10): qty 40

## 2019-02-06 MED ORDER — DOCUSATE SODIUM 100 MG PO CAPS
200.0000 mg | ORAL_CAPSULE | Freq: Once | ORAL | Status: AC
  Administered 2019-02-06: 21:00:00 200 mg via ORAL
  Filled 2019-02-06: qty 2

## 2019-02-06 MED ORDER — FUROSEMIDE 10 MG/ML IJ SOLN
20.0000 mg | Freq: Once | INTRAMUSCULAR | Status: AC
  Administered 2019-02-06: 20 mg via INTRAVENOUS
  Filled 2019-02-06: qty 2

## 2019-02-06 MED ORDER — FUROSEMIDE 10 MG/ML IJ SOLN
20.0000 mg | Freq: Once | INTRAMUSCULAR | Status: AC
  Administered 2019-02-06: 20 mg via INTRAVENOUS

## 2019-02-06 MED ORDER — DOCUSATE SODIUM 100 MG PO CAPS
100.0000 mg | ORAL_CAPSULE | Freq: Every day | ORAL | Status: DC
  Administered 2019-02-07 – 2019-02-11 (×5): 100 mg via ORAL
  Filled 2019-02-06 (×5): qty 1

## 2019-02-06 NOTE — Progress Notes (Signed)
PROGRESS NOTE    Terri [name]  [id number] DOB: 1975-11-21 DOA: 02/02/2019 PCP: [name], MD    Brief Narrative:  43 y.o. female with medical history significant of depression, bipolar disorder, chronic back pain, hypertension, previous DVT, migraine headaches and previous pneumonia who presented to the ER with complaint of shortness of breath cough generalized weakness.  Patient was seen and evaluated.  She has had some chills headaches with vomiting and diarrhea over the last 4 days.  She has been taking Tylenol and NyQuil but no response.  Patient was found to be mildly hypoxic on arrival and placed on oxygen.  She had elevated lactic acid and symptoms suggestive of sepsis.  Chest x-ray was not revealing.  Urinalysis suggested UTI as possible cause of her sepsis.  Patient is being admitted to the hospital for work-up..  ED Course: Temperature is 99.9 blood pressure 81/52 pulse 115 respirate of 32 oxygen sat 91% on 2 L.  White count is 12.6 hemoglobin 9.1 platelets 119.  Potassium 3.4 creatinine 1.7 BUN 30 and calcium of 7.8.  Lactic acid 2.8.  Urinalysis showed cloudy urine with large leukocytes.  Many bacteria WBC 21-50.  Chest x-ray showed no active disease.  CT abdomen pelvis showed bilateral renal enlargement with heterogeneous attenuation.  Consistent with pyelonephritis.  Patient is being admitted for treatment and early sepsis.  Assessment & Plan:   Principal Problem:   Sepsis ([place]) Active Problems:   Essential hypertension   Leucocytosis   Thrombocytosis (HCC)   UTI (urinary tract infection)   #1 Sepsis with ecoli bacteremia and pyelonephritis present on admit:  - Presenting UA suggestive of UTI. Urine and blood cultures pos for pan-sensitive ecoli -Discussed with Pharmacy, abx transitioned to IV ancef -B pyelo on CT abd. Afebrile. WBC continues to trend up to over 17k  -Will need to follow WBC trends, however pt refused blood draw this AM -Recent CXR personally  reviewed. No florid pna, however concern for pulm edema, see below -Clinically seems to be improving. Will repeat CBC in AM  #2 hypertension:  - BP stable and controlled at this time - Currently hemodynamically stable - On IVF given decreased PO intake and ARF, but will further decrease to 50cc/hr given concerns of pulm edema  #3 thrombocytopenia:  -Suspect secondary to acute infection and sepsis -Labs reviewed, plts continues to worsen, to 72 on 9/14 -Pt refused 9/15 blood draw. Will order CBC for AM  #4 acute pyelonephritis:  -Reviewed on CT per above -Continue treatment with IV antibiotics as above. -Currently needing pain control wiht dilaudid, but would recommend decreasing analgesic as pt improves  #5 acute on chronic low back pain:  -Intractable pain, not improved with home neurontin. Likely worse secondary to CVA tenderness from pyelo -Seems better controlled with dilaudid -Of note, NCCSR reviewed. Pt has hx of being prescribed Buprenorphine in March 2020  #6 depression with anxiety:  -Continued on home regimen and continue counseling  #7 shortness of breath:  -Cont to optimize analgesia -decreased IVF rate per above  #8 ARF -Cr peaked to 2.03 -Clinically dehydrated  -Cr now improving with IVF hydration -Will need to repeat BMET in AM  #9 Dysphagia -New issue this admit -Concerns for possible obstruction s/p prior gastric bypass -Have consulted GI. Recommendation for possible EGD this week when pt is more stable  DVT prophylaxis: SCD's Code Status: Full Family Communication: Pt in room, family not at bedside Disposition Plan: Uncertain at this time  Consultants:   GI  Procedures:     Antimicrobials: Anti-infectives (From admission, onward)   Start     Dose/Rate Route Frequency Ordered Stop   02/05/19 1400  ceFAZolin (ANCEF) IVPB 2g/100 mL premix     2 g 200 mL/hr over 30 Minutes Intravenous Every 8 hours 02/05/19 1136     02/03/19 1800   vancomycin (VANCOCIN) 1,250 mg in sodium chloride 0.9 % 250 mL IVPB  Status:  Discontinued     1,250 mg 166.7 mL/hr over 90 Minutes Intravenous Every 24 hours 02/02/19 2321 02/03/19 1103   02/03/19 1600  cefTRIAXone (ROCEPHIN) 2 g in sodium chloride 0.9 % 100 mL IVPB  Status:  Discontinued     2 g 200 mL/hr over 30 Minutes Intravenous Every 24 hours 02/03/19 1105 02/05/19 1136   02/03/19 0800  ceFEPIme (MAXIPIME) 2 g in sodium chloride 0.9 % 100 mL IVPB  Status:  Discontinued     2 g 200 mL/hr over 30 Minutes Intravenous Every 12 hours 02/02/19 2317 02/03/19 1103   02/02/19 1700  ceFEPIme (MAXIPIME) 2 g in sodium chloride 0.9 % 100 mL IVPB     2 g 200 mL/hr over 30 Minutes Intravenous  Once 02/02/19 1649 02/02/19 1837   02/02/19 1700  metroNIDAZOLE (FLAGYL) IVPB 500 mg     500 mg 100 mL/hr over 60 Minutes Intravenous  Once 02/02/19 1649 02/02/19 2126   02/02/19 1700  vancomycin (VANCOCIN) IVPB 1000 mg/200 mL premix  Status:  Discontinued     1,000 mg 200 mL/hr over 60 Minutes Intravenous  Once 02/02/19 1649 02/02/19 1653   02/02/19 1700  vancomycin (VANCOCIN) 1,750 mg in sodium chloride 0.9 % 500 mL IVPB     1,750 mg 250 mL/hr over 120 Minutes Intravenous  Once 02/02/19 1653 02/02/19 2011   02/02/19 1645  cefTRIAXone (ROCEPHIN) 1 g in sodium chloride 0.9 % 100 mL IVPB  Status:  Discontinued     1 g 200 mL/hr over 30 Minutes Intravenous  Once 02/02/19 1636 02/02/19 1657      Subjective: Feeling frustrated about multiple blood draws this AM  Objective: Vitals:   02/06/19 0013 02/06/19 0210 02/06/19 0517 02/06/19 1454  BP: (!) 95/58 96/65 115/70 (!) 115/59  Pulse: (!) 102 97 92 96  Resp: 18 20 20 20   Temp: 99.7 F (37.6 C) 99.5 F (37.5 C) 98.1 F (36.7 C) 99 F (37.2 C)  TempSrc: Oral Oral  Oral  SpO2: 92% 96% 96% 96%  Weight:      Height:        Intake/Output Summary (Last 24 hours) at 02/06/2019 1528 Last data filed at 02/06/2019 1457 Gross per 24 hour  Intake 1363.58  ml  Output 3750 ml  Net -2386.42 ml   Filed Weights   02/02/19 1313  Weight: 77.1 kg    Examination: General exam: Conversant, in no acute distress Respiratory system: normal chest rise, clear, no audible wheezing Cardiovascular system: regular rhythm, s1-s2 Gastrointestinal system: Nondistended, nontender, pos BS Central nervous system: No seizures, no tremors Extremities: No cyanosis, no joint deformities Skin: No rashes, no pallor Psychiatry: Affect normal // no auditory hallucinations   Data Reviewed: I have personally reviewed following labs and imaging studies  CBC: Recent Labs  Lab 02/02/19 1753 02/03/19 0516 02/04/19 0831 02/05/19 0610  WBC 12.6* 10.7* 14.9* 17.2*  NEUTROABS 11.3*  --   --   --   HGB 9.0* 8.5* 8.4* 7.9*  HCT 27.5* 26.2* 25.8* 24.1*  MCV 67.4* 68.6* 67.9* 66.9*  PLT 119* 85* 73* 72*   Basic Metabolic Panel: Recent Labs  Lab 02/02/19 1510 02/03/19 0516 02/04/19 0831 02/05/19 0610  NA 136 139 138 139  K 3.4* 2.7* 3.5 3.5  CL 99 106 109 111  CO2 23 21* 22 21*  GLUCOSE 53* 41* 80 70  BUN 30* 34* 41* 36*  CREATININE 1.70* 1.71* 2.03* 1.87*  CALCIUM 7.8* 7.3* 7.7* 7.9*  MG  --  1.7  --   --    GFR: Estimated Creatinine Clearance: 39.7 mL/min (A) (by C-G formula based on SCr of 1.87 mg/dL (H)). Liver Function Tests: Recent Labs  Lab 02/02/19 1510 02/03/19 0516 02/05/19 0610  AST 33 29 23  ALT 18 20 16   ALKPHOS 117 102 158*  BILITOT 1.9* 1.8* 1.1  PROT 5.7* 5.0* 4.6*  ALBUMIN 2.5* 2.1* 1.7*   No results for input(s): LIPASE, AMYLASE in the last 168 hours. No results for input(s): AMMONIA in the last 168 hours. Coagulation Profile: Recent Labs  Lab 02/02/19 1753  INR 1.1   Cardiac Enzymes: No results for input(s): CKTOTAL, CKMB, CKMBINDEX, TROPONINI in the last 168 hours. BNP (last 3 results) No results for input(s): PROBNP in the last 8760 hours. HbA1C: No results for input(s): HGBA1C in the last 72 hours. CBG: Recent  Labs  Lab 02/05/19 2103 02/06/19 0007 02/06/19 0332 02/06/19 0756 02/06/19 1139  GLUCAP 95 89 86 83 139*   Lipid Profile: No results for input(s): CHOL, HDL, LDLCALC, TRIG, CHOLHDL, LDLDIRECT in the last 72 hours. Thyroid Function Tests: No results for input(s): TSH, T4TOTAL, FREET4, T3FREE, THYROIDAB in the last 72 hours. Anemia Panel: Recent Labs    02/05/19 1654  VITAMINB12 766  FOLATE 7.6  FERRITIN 98  TIBC 206*  IRON 5*  RETICCTPCT 0.1*   Sepsis Labs: Recent Labs  Lab 02/02/19 1510 02/02/19 1753 02/02/19 1848  LATICACIDVEN 2.8* 1.7 1.6    Recent Results (from the past 240 hour(s))  SARS Coronavirus 2 [facility] order, Performed in [facility] hospital lab) Nasopharyngeal Nasopharyngeal Swab     Status: None   Collection Time: 02/02/19  3:10 PM   Specimen: Nasopharyngeal Swab  Result Value Ref Range Status   SARS Coronavirus 2 NEGATIVE NEGATIVE Final    Comment: (NOTE) If result is NEGATIVE SARS-CoV-2 target nucleic acids are NOT DETECTED. The SARS-CoV-2 RNA is generally detectable in upper and lower  respiratory specimens during the acute phase of infection. The lowest  concentration of SARS-CoV-2 viral copies this assay can detect is 250  copies / mL. A negative result does not preclude SARS-CoV-2 infection  and should not be used as the sole basis for treatment or other  patient management decisions.  A negative result may occur with  improper specimen collection / handling, submission of specimen other  than nasopharyngeal swab, presence of viral mutation(s) within the  areas targeted by this assay, and inadequate number of viral copies  (<250 copies / mL). A negative result must be combined with clinical  observations, patient history, and epidemiological information. If result is POSITIVE SARS-CoV-2 target nucleic acids are DETECTED. The SARS-CoV-2 RNA is generally detectable in upper and lower  respiratory specimens dur ing the acute phase of  infection.  Positive  results are indicative of active infection with SARS-CoV-2.  Clinical  correlation with patient history and other diagnostic information is  necessary to determine patient infection status.  Positive results do  not rule out bacterial infection or co-infection with other viruses. If result is  PRESUMPTIVE POSTIVE SARS-CoV-2 nucleic acids MAY BE PRESENT.   A presumptive positive result was obtained on the submitted specimen  and confirmed on repeat testing.  While 2019 novel coronavirus  (SARS-CoV-2) nucleic acids may be present in the submitted sample  additional confirmatory testing may be necessary for epidemiological  and / or clinical management purposes  to differentiate between  SARS-CoV-2 and other Sarbecovirus currently known to infect humans.  If clinically indicated additional testing with an alternate test  methodology [phone]) is advised. The SARS-CoV-2 RNA is generally  detectable in upper and lower respiratory sp ecimens during the acute  phase of infection. The expected result is Negative. Fact Sheet for Patients:  StrictlyIdeas.no Fact Sheet for Healthcare Providers: BankingDealers.co.za This test is not yet approved or cleared by the [place] FDA and has been authorized for detection and/or diagnosis of SARS-CoV-2 by FDA under an Emergency Use Authorization (EUA).  This EUA will remain in effect (meaning this test can be used) for the duration of the COVID-19 declaration under Section 564(b)(1) of the Act, 21 U.S.C. section 360bbb-3(b)(1), unless the authorization is terminated or revoked sooner. Performed at [facility], [place] [address], [place], [place] [address]   Urine Culture     Status: Abnormal   Collection Time: 02/02/19  3:10 PM   Specimen: Urine, Clean Catch  Result Value Ref Range Status   Specimen Description   Final    URINE, CLEAN CATCH Performed at [facility], [place] [address], [place], [place] [address]    Special Requests   Final    NONE Performed at [facility], [place] [address], [place], [place] [address]    Culture 30,000 COLONIES/mL ESCHERICHIA COLI (A)  Final   Report Status 02/05/2019 FINAL  Final   Organism ID, Bacteria ESCHERICHIA COLI (A)  Final      Susceptibility   Escherichia coli - MIC*    AMPICILLIN <=2 SENSITIVE Sensitive     CEFAZOLIN <=4 SENSITIVE Sensitive     CEFTRIAXONE <=1 SENSITIVE Sensitive     CIPROFLOXACIN <=0.25 SENSITIVE Sensitive     GENTAMICIN <=1 SENSITIVE Sensitive     IMIPENEM <=0.25 SENSITIVE Sensitive     NITROFURANTOIN <=16 SENSITIVE Sensitive     TRIMETH/SULFA <=20 SENSITIVE Sensitive     AMPICILLIN/SULBACTAM <=2 SENSITIVE Sensitive     PIP/TAZO <=4 SENSITIVE Sensitive     Extended ESBL NEGATIVE Sensitive     * 30,000 COLONIES/mL ESCHERICHIA COLI  Blood Culture (routine x 2)     Status: Abnormal   Collection Time: 02/02/19  4:53 PM   Specimen: BLOOD LEFT FOREARM  Result Value Ref Range Status   Specimen Description   Final    BLOOD LEFT FOREARM Performed at [place] [address] [place], [place] [address]    Special Requests   Final    BOTTLES DRAWN AEROBIC AND ANAEROBIC Blood Culture adequate volume Performed at [place] [address] [place], [place] [address]    Culture  Setup Time   Final    GRAM NEGATIVE RODS IN BOTH AEROBIC AND ANAEROBIC BOTTLES CRITICAL RESULT CALLED TO, READ BACK BY AND VERIFIED WITH: PHARMD M LILLISTONE [id number] AT 1021 AM BY CM Performed at [facility], [place] [address] [place], [place] [address]    Culture ESCHERICHIA COLI (A)  Final   Report Status 02/05/2019 FINAL  Final   Organism ID, Bacteria ESCHERICHIA COLI  Final  Susceptibility   Escherichia coli - MIC*    AMPICILLIN <=2 SENSITIVE Sensitive     CEFAZOLIN <=4 SENSITIVE Sensitive     CEFEPIME <=1  SENSITIVE Sensitive     CEFTAZIDIME <=1 SENSITIVE Sensitive     CEFTRIAXONE <=1 SENSITIVE Sensitive     CIPROFLOXACIN <=0.25 SENSITIVE Sensitive     GENTAMICIN <=1 SENSITIVE Sensitive     IMIPENEM <=0.25 SENSITIVE Sensitive     TRIMETH/SULFA <=20 SENSITIVE Sensitive     AMPICILLIN/SULBACTAM <=2 SENSITIVE Sensitive     PIP/TAZO <=4 SENSITIVE Sensitive     Extended ESBL NEGATIVE Sensitive     * ESCHERICHIA COLI  Blood Culture ID Panel (Reflexed)     Status: Abnormal   Collection Time: 02/02/19  4:53 PM  Result Value Ref Range Status   Enterococcus species NOT DETECTED NOT DETECTED Final   Listeria monocytogenes NOT DETECTED NOT DETECTED Final   Staphylococcus species NOT DETECTED NOT DETECTED Final   Staphylococcus aureus (BCID) NOT DETECTED NOT DETECTED Final   Streptococcus species NOT DETECTED NOT DETECTED Final   Streptococcus agalactiae NOT DETECTED NOT DETECTED Final   Streptococcus pneumoniae NOT DETECTED NOT DETECTED Final   Streptococcus pyogenes NOT DETECTED NOT DETECTED Final   Acinetobacter baumannii NOT DETECTED NOT DETECTED Final   Enterobacteriaceae species DETECTED (A) NOT DETECTED Final    Comment: Enterobacteriaceae represent a large family of gram-negative bacteria, not a single organism. CRITICAL RESULT CALLED TO, READ BACK BY AND VERIFIED WITH: PHARMD M LILLISTONE [id number] AT 1022 BY CM    Enterobacter cloacae complex NOT DETECTED NOT DETECTED Final   Escherichia coli DETECTED (A) NOT DETECTED Final    Comment: CRITICAL RESULT CALLED TO, READ BACK BY AND VERIFIED WITH: PHARMD M LILLISTONE [id number] AT 18 AM BY CM    Klebsiella oxytoca NOT DETECTED NOT DETECTED Final   Klebsiella pneumoniae NOT DETECTED NOT DETECTED Final   Proteus species NOT DETECTED NOT DETECTED Final   Serratia marcescens NOT DETECTED NOT DETECTED Final   Carbapenem resistance NOT DETECTED NOT DETECTED Final   Haemophilus influenzae NOT DETECTED NOT DETECTED Final   Neisseria meningitidis NOT  DETECTED NOT DETECTED Final   Pseudomonas aeruginosa NOT DETECTED NOT DETECTED Final   Candida albicans NOT DETECTED NOT DETECTED Final   Candida glabrata NOT DETECTED NOT DETECTED Final   Candida krusei NOT DETECTED NOT DETECTED Final   Candida parapsilosis NOT DETECTED NOT DETECTED Final   Candida tropicalis NOT DETECTED NOT DETECTED Final    Comment: Performed at [facility], [place] [address], [place], [place] [address]  Blood Culture (routine x 2)     Status: Abnormal   Collection Time: 02/02/19  5:53 PM   Specimen: BLOOD  Result Value Ref Range Status   Specimen Description   Final    BLOOD RIGHT ANTECUBITAL Performed at [place] [address], [place], [place] [address]    Special Requests   Final    BOTTLES DRAWN AEROBIC AND ANAEROBIC Blood Culture results may not be optimal due to an excessive volume of blood received in culture bottles Performed at [place] [address], [place], [place] [address]    Culture  Setup Time   Final    GRAM NEGATIVE RODS IN BOTH AEROBIC AND ANAEROBIC BOTTLES CRITICAL RESULT CALLED TO, READ BACK BY AND VERIFIED WITH: PHARMD M LILLISTONE [id number] AT 58 AM BY CM    Culture (A)  Final    ESCHERICHIA COLI SUSCEPTIBILITIES PERFORMED ON PREVIOUS  CULTURE WITHIN THE LAST 5 DAYS. Performed at [facility], [place] [address] [place], [place] [address]    Report Status 02/05/2019 FINAL  Final     Radiology Studies: Dg Chest 2 View  Result Date: 02/05/2019 CLINICAL DATA:  43 year old female with a history of shortness of breath EXAM: CHEST - 2 VIEW COMPARISON:  February 02, 2019 FINDINGS: Cardiomediastinal silhouette unchanged in size and contour. New interlobular septal thickening compared to the prior with linear opacities of the bilateral lungs and thickening of the minor fissure. No pneumothorax. No pleural effusion. No confluent airspace disease. IMPRESSION: Evidence of early pulmonary  edema. Electronically Signed   By: Corrie Mckusick D.O.   On: 02/05/2019 16:22    Scheduled Meds: . amitriptyline  75 mg Oral QHS  . gabapentin  300 mg Oral TID  . hydrochlorothiazide  12.5 mg Oral Daily  . insulin aspart  0-9 Units Subcutaneous Q4H  . lisinopril  10 mg Oral Daily  . magic mouthwash  10 mL Oral QID  . ondansetron (ZOFRAN) IV  4 mg Intravenous Q6H   Or  . ondansetron  4 mg Oral Q6H  . pantoprazole (PROTONIX) IV  40 mg Intravenous Q12H   Continuous Infusions: .  ceFAZolin (ANCEF) IV 2 g (02/06/19 1353)  . dextrose 5 % and 0.9% NaCl 75 mL/hr at 02/06/19 [id number]     LOS: 4 days   Marylu Lund, MD [facility] Pager On Amion  If 7PM-7AM, please contact night-coverage 02/06/2019, 3:28 PM

## 2019-02-06 NOTE — Progress Notes (Signed)
Refused lab work X 2.

## 2019-02-06 NOTE — Progress Notes (Signed)
Patient ID: Terri [name], female   DOB: [birth year], 43 y.o.   MRN: [id number]    Progress Note   Subjective  Day # 3 CC; N/V abd pain/ fever/urosepsis  Feels "terrible " - febrile last night, remains SOB, chest hurts /sore , coughing  Still C/o some discomfort with swallowing in paryngeal area and when food gets to bottom of esophagus. Wants to try to eat  Fe 5/TIBC 206/sat 2/ ferritin 98  B12 766 CXR 9/14- early Pulm edema  Tmax 101.2 last pm   Objective   Vital signs in last 24 hours: Temp:  [98.1 F (36.7 C)-102 F (38.9 C)] 98.1 F (36.7 C) (09/15 0517) Pulse Rate:  [92-114] 92 (09/15 0517) Resp:  [18-24] 20 (09/15 0517) BP: (95-135)/(58-82) 115/70 (09/15 0517) SpO2:  [92 %-100 %] 96 % (09/15 0517) Last BM Date: 02/05/19 General:   Well-developed white female somewhat chronically ill-appearing in NAD, grimacing complaining of pain all over Heart:  Regular rate and rhythm; no murmurs Lungs: Bibasilar rhonchi and rails Abdomen:  Soft, tender across the upper abdomen and nondistended. Normal bowel sounds. Extremities:  Without edema. Neurologic:  Alert and oriented,  grossly normal neurologically. Psych:  Cooperative. Normal mood and affect.  Intake/Output from previous day: 09/14 0701 - 09/15 0700 In: 2358.7 [P.O.:480; I.V.:1678.7; IV Piggyback:200] Out: 3850 [Urine:3850] Intake/Output this shift: Total I/O In: -  Out: 1000 [Urine:1000]  Lab Results: Recent Labs    02/04/19 0831 02/05/19 0610  WBC 14.9* 17.2*  HGB 8.4* 7.9*  HCT 25.8* 24.1*  PLT 73* 72*   BMET Recent Labs    02/04/19 0831 02/05/19 0610  NA 138 139  K 3.5 3.5  CL 109 111  CO2 22 21*  GLUCOSE 80 70  BUN 41* 36*  CREATININE 2.03* 1.87*  CALCIUM 7.7* 7.9*   LFT Recent Labs    02/05/19 0610  PROT 4.6*  ALBUMIN 1.7*  AST 23  ALT 16  ALKPHOS 158*  BILITOT 1.1   PT/INR No results for input(s): LABPROT, INR in the last 72 hours.  Studies/Results: Dg Chest 2 View  Result  Date: 02/05/2019 CLINICAL DATA:  43 year old female with a history of shortness of breath EXAM: CHEST - 2 VIEW COMPARISON:  February 02, 2019 FINDINGS: Cardiomediastinal silhouette unchanged in size and contour. New interlobular septal thickening compared to the prior with linear opacities of the bilateral lungs and thickening of the minor fissure. No pneumothorax. No pleural effusion. No confluent airspace disease. IMPRESSION: Evidence of early pulmonary edema. Electronically Signed   By: Corrie Mckusick D.O.   On: 02/05/2019 16:22       Assessment / Plan:    #4 43 year old white female admitted with urosepsis, with Enterobacter and E. Coli.  Patient has had recurrent fevers, leukocytosis-work-up in progress. She has pulmonary edema on chest x-ray yesterday no infiltrate seen, her symptoms are primarily respiratory  #2 complaints of nausea and vomiting and vague odynophagia, epigastric pain. She is status post Roux-en-Y gastric bypass 2012 CT abdomen this admission with a focally dilated loop of small bowel adjacent to her anastomosis Prior EGD 1 year ago with patent anastomosis.  Mentions small fistula  Covering with IV PPI, now twice daily, full liquid diet today Will need EGD and enteroscopy this week, holding today until recurrent fevers and dyspnea sorted out  #3 anemia, history of iron deficiency-current labs more consistent with anemia of chronic disease #4 thrombocytopenia #5 bipolar disorder #6.  Status post cholecystectomy and lysis of adhesions  Principal Problem:   Sepsis ([place]) Active Problems:   Essential hypertension   Leucocytosis   Thrombocytosis (HCC)   UTI (urinary tract infection)     LOS: 4 days   Amy EsterwoodPA-C  02/06/2019, 9:04 AM

## 2019-02-06 NOTE — Progress Notes (Signed)
Pt lying in bed.  Did take offered coke for CBG 68.  Will not wear oxygen.  States we are calling her a liar because she did have blood work done.  Instructed that she pulled away after being stuck and no labs received.  Refusing tylenol and scheduled meds currently.  Congested cough.  Instructed cough and deep breathing.  Will continue to monitor.

## 2019-02-06 NOTE — Progress Notes (Addendum)
MEWS score is now acute. Yellow MEWS is now a RED MEWS.  PCP was notified.

## 2019-02-07 ENCOUNTER — Inpatient Hospital Stay (HOSPITAL_COMMUNITY): Payer: Medicare Other

## 2019-02-07 DIAGNOSIS — R7881 Bacteremia: Secondary | ICD-10-CM | POA: Diagnosis present

## 2019-02-07 DIAGNOSIS — R1084 Generalized abdominal pain: Secondary | ICD-10-CM

## 2019-02-07 DIAGNOSIS — A4151 Sepsis due to Escherichia coli [E. coli]: Principal | ICD-10-CM

## 2019-02-07 DIAGNOSIS — R935 Abnormal findings on diagnostic imaging of other abdominal regions, including retroperitoneum: Secondary | ICD-10-CM

## 2019-02-07 DIAGNOSIS — E876 Hypokalemia: Secondary | ICD-10-CM | POA: Diagnosis present

## 2019-02-07 DIAGNOSIS — R079 Chest pain, unspecified: Secondary | ICD-10-CM | POA: Diagnosis present

## 2019-02-07 DIAGNOSIS — K209 Esophagitis, unspecified without bleeding: Secondary | ICD-10-CM | POA: Diagnosis present

## 2019-02-07 DIAGNOSIS — R6520 Severe sepsis without septic shock: Secondary | ICD-10-CM

## 2019-02-07 DIAGNOSIS — B9620 Unspecified Escherichia coli [E. coli] as the cause of diseases classified elsewhere: Secondary | ICD-10-CM | POA: Diagnosis present

## 2019-02-07 LAB — COMPREHENSIVE METABOLIC PANEL
ALT: 6 U/L (ref 0–44)
AST: 16 U/L (ref 15–41)
Albumin: 1.6 g/dL — ABNORMAL LOW (ref 3.5–5.0)
Alkaline Phosphatase: 147 U/L — ABNORMAL HIGH (ref 38–126)
Anion gap: 9 (ref 5–15)
BUN: 28 mg/dL — ABNORMAL HIGH (ref 6–20)
CO2: 25 mmol/L (ref 22–32)
Calcium: 8 mg/dL — ABNORMAL LOW (ref 8.9–10.3)
Chloride: 104 mmol/L (ref 98–111)
Creatinine, Ser: 1.35 mg/dL — ABNORMAL HIGH (ref 0.44–1.00)
GFR calc Af Amer: 55 mL/min — ABNORMAL LOW (ref >60)
GFR calc non Af Amer: 47 mL/min — ABNORMAL LOW (ref >60)
Glucose, Bld: 82 mg/dL (ref 70–99)
Potassium: 2.5 mmol/L — CL (ref 3.5–5.1)
Sodium: 138 mmol/L (ref 135–145)
Total Bilirubin: 0.8 mg/dL (ref 0.3–1.2)
Total Protein: 4.5 g/dL — ABNORMAL LOW (ref 6.5–8.1)

## 2019-02-07 LAB — CBC
HCT: 21.7 % — ABNORMAL LOW (ref 36.0–46.0)
Hemoglobin: 7.3 g/dL — ABNORMAL LOW (ref 12.0–15.0)
MCH: 21.9 pg — ABNORMAL LOW (ref 26.0–34.0)
MCHC: 33.6 g/dL (ref 30.0–36.0)
MCV: 65 fL — ABNORMAL LOW (ref 80.0–100.0)
Platelets: 110 10*3/uL — ABNORMAL LOW (ref 150–400)
RBC: 3.34 MIL/uL — ABNORMAL LOW (ref 3.87–5.11)
RDW: 18.1 % — ABNORMAL HIGH (ref 11.5–15.5)
WBC: 16.7 10*3/uL — ABNORMAL HIGH (ref 4.0–10.5)
nRBC: 0 % (ref 0.0–0.2)

## 2019-02-07 LAB — GLUCOSE, CAPILLARY
Glucose-Capillary: 102 mg/dL — ABNORMAL HIGH (ref 70–99)
Glucose-Capillary: 110 mg/dL — ABNORMAL HIGH (ref 70–99)
Glucose-Capillary: 69 mg/dL — ABNORMAL LOW (ref 70–99)
Glucose-Capillary: 75 mg/dL (ref 70–99)
Glucose-Capillary: 89 mg/dL (ref 70–99)
Glucose-Capillary: 90 mg/dL (ref 70–99)
Glucose-Capillary: 92 mg/dL (ref 70–99)
Glucose-Capillary: 95 mg/dL (ref 70–99)

## 2019-02-07 LAB — TROPONIN I (HIGH SENSITIVITY)
Troponin I (High Sensitivity): 12 ng/L (ref <18)
Troponin I (High Sensitivity): 13 ng/L (ref <18)

## 2019-02-07 LAB — MAGNESIUM: Magnesium: 1.5 mg/dL — ABNORMAL LOW (ref 1.7–2.4)

## 2019-02-07 MED ORDER — POTASSIUM CHLORIDE 10 MEQ/100ML IV SOLN
10.0000 meq | INTRAVENOUS | Status: AC
  Administered 2019-02-07 (×3): 10 meq via INTRAVENOUS
  Filled 2019-02-07 (×3): qty 100

## 2019-02-07 MED ORDER — SODIUM CHLORIDE 0.9 % IV SOLN
INTRAVENOUS | Status: DC | PRN
  Administered 2019-02-07: 250 mL via INTRAVENOUS

## 2019-02-07 MED ORDER — POTASSIUM CHLORIDE 10 MEQ/100ML IV SOLN
10.0000 meq | INTRAVENOUS | Status: AC
  Administered 2019-02-07 (×3): 10 meq via INTRAVENOUS
  Filled 2019-02-07 (×3): qty 100

## 2019-02-07 MED ORDER — HYDROMORPHONE HCL 1 MG/ML IJ SOLN
1.0000 mg | INTRAMUSCULAR | Status: DC | PRN
  Administered 2019-02-07 – 2019-02-11 (×21): 2 mg via INTRAVENOUS
  Administered 2019-02-11: 1 mg via INTRAVENOUS
  Administered 2019-02-11: 06:00:00 2 mg via INTRAVENOUS
  Filled 2019-02-07 (×3): qty 2
  Filled 2019-02-07: qty 1
  Filled 2019-02-07 (×20): qty 2

## 2019-02-07 MED ORDER — MAGNESIUM SULFATE 4 GM/100ML IV SOLN
4.0000 g | Freq: Once | INTRAVENOUS | Status: AC
  Administered 2019-02-07: 4 g via INTRAVENOUS
  Filled 2019-02-07: qty 100

## 2019-02-07 MED ORDER — FUROSEMIDE 10 MG/ML IJ SOLN
20.0000 mg | Freq: Once | INTRAMUSCULAR | Status: AC
  Administered 2019-02-07: 20 mg via INTRAVENOUS
  Filled 2019-02-07: qty 2

## 2019-02-07 MED ORDER — POTASSIUM CHLORIDE CRYS ER 20 MEQ PO TBCR
40.0000 meq | EXTENDED_RELEASE_TABLET | ORAL | Status: AC
  Administered 2019-02-07: 40 meq via ORAL
  Filled 2019-02-07: qty 2

## 2019-02-07 NOTE — Progress Notes (Signed)
Patient ID: Terri [name], female   DOB: 07/17/1975, 43 y.o.   MRN: [id number]    Progress Note   Subjective  Day # 4 CC : N/V , abd pain/fever/urosepsis  Still feeling horrible, wet cough and complaining of sharp pains all around her chest into her back, worse with breathing.  Diet was held this morning says she thinks she could eat.  Tmax 99.6 K+ 2.5 WBC 16.7, hgb 7.3, plts 110  Creat 1.3 Tbili 0.8,alk phos 147, AST 16 Repeat BC - P Refusing meds last pm , had refused labs yesterday    Objective   Vital signs in last 24 hours: Temp:  [99 F (37.2 C)-99.6 F (37.6 C)] 99.6 F (37.6 C) (09/16 0537) Pulse Rate:  [94-97] 94 (09/16 0537) Resp:  [18-20] 18 (09/16 0537) BP: (97-126)/(59-76) 126/76 (09/16 0537) SpO2:  [90 %-97 %] 90 % (09/16 0537) Last BM Date: 02/06/19 General:    white female in NAD, acute and chronically ill-appearing, coughing productive of clear to yellow sputum Heart:  Regular rate and rhythm; no murmurs Lungs: Respirations even , diffuse rhonchi, no tenderness of the chest wall to palpation Abdomen:  Soft, nontender and nondistended. Normal bowel sounds. Extremities:  Without edema. Neurologic:  Alert and oriented,  grossly normal neurologically. Psych:  Cooperative. Normal mood and affect.  Intake/Output from previous day: 09/15 0701 - 09/16 0700 In: 1672 [P.O.:845; I.V.:450; IV Piggyback:377] Out: 7650 [Urine:7650] Intake/Output this shift: No intake/output data recorded.  Lab Results: Recent Labs    02/05/19 0610 02/07/19 0523  WBC 17.2* 16.7*  HGB 7.9* 7.3*  HCT 24.1* 21.7*  PLT 72* 110*   BMET Recent Labs    02/05/19 0610 02/07/19 0523  NA 139 138  K 3.5 2.5*  CL 111 104  CO2 21* 25  GLUCOSE 70 82  BUN 36* 28*  CREATININE 1.87* 1.35*  CALCIUM 7.9* 8.0*   LFT Recent Labs    02/07/19 0523  PROT 4.5*  ALBUMIN 1.6*  AST 16  ALT 6  ALKPHOS 147*  BILITOT 0.8   PT/INR No results for input(s): LABPROT, INR in the last 72  hours.  Studies/Results: Dg Chest 2 View  Result Date: 02/05/2019 CLINICAL DATA:  43-year-old female with a history of shortness of breath EXAM: CHEST - 2 VIEW COMPARISON:  February 02, 2019 FINDINGS: Cardiomediastinal silhouette unchanged in size and contour. New interlobular septal thickening compared to the prior with linear opacities of the bilateral lungs and thickening of the minor fissure. No pneumothorax. No pleural effusion. No confluent airspace disease. IMPRESSION: Evidence of early pulmonary edema. Electronically Signed   By: Jaime  Wagner D.O.   On: 02/05/2019 16:22       Assessment / Plan:    #1 43-year-old white female admitted with urosepsis/pyelonephritis who has had recurrent fevers, and persistent leukocytosis.  On Ancef Chest x-ray 914 showed early pulmonary edema no infiltrate Continues to have significant wet hacking cough, now complaining of chest discomfort radiating around her chest bilaterally  Repeat blood cultures done and pending Temp curve down Acute kidney injury-improving  #2 nausea vomiting epigastric pain vague dysphasia. Patient is status post Roux-en-Y gastric bypass 2012  EGD 1 year ago with patent anastomosis, small gastric fistula mentioned  CT of the abdomen this admission showed a focally dilated loop of small bowel adjacent to the anastomosis of unclear clinical significance  #3 hypokalemia-correcting #4 anemia, most consistent with anemia of chronic disease #5 thrombocytopenia #6 bipolar disorder #7 status post cholecystectomy   and lysis of adhesions  Plan; full liquid diet today N.p.o. after midnight Continue twice daily PPI IV We will proceed with EGD/enteroscopy at 11:30 AM tomorrow with Dr. Mansouraty     Principal Problem:   Sepsis (HCC) Active Problems:   Essential hypertension   Leucocytosis   Thrombocytosis (HCC)   UTI (urinary tract infection)     LOS: 5 days   Leasha Goldberger PA-C 02/07/2019, 10:23 AM  

## 2019-02-07 NOTE — Progress Notes (Signed)
CRITICAL VALUE ALERT  Critical Value:  K+ 2.5  Date & Time Notied:  02/07/2019; 0630  Provider Notified: Lamar Blinks, [id number]  Orders Received/Actions taken: awaiting new orders

## 2019-02-07 NOTE — Progress Notes (Signed)
PROGRESS NOTE    Terri [name]  [id number] DOB: December 26, 1975 DOA: 02/02/2019 PCP: [name], MD    Brief Narrative:  43 y.o.femalewith medical history significant ofdepression, bipolar disorder, chronic back pain, hypertension, previous DVT, migraine headaches and previous pneumonia who presented to the ER with complaint of shortness of breath cough generalized weakness. Patient was seen and evaluated. She has had some chills headaches with vomiting and diarrhea over the last 4 days. She has been taking Tylenol and NyQuil but no response. Patient was found to be mildly hypoxic on arrival and placed on oxygen. She had elevated lactic acid and symptoms suggestive of sepsis. Chest x-ray was not revealing. Urinalysis suggested UTI as possible cause of her sepsis. Patient is being admitted to the hospital for work-up..  ED Course:Temperature is 99.9 blood pressure 81/52 pulse 115 respirate of 32 oxygen sat 91% on 2 L. White count is 12.6 hemoglobin 9.1 platelets 119. Potassium 3.4 creatinine 1.7 BUN 30 and calcium of 7.8. Lactic acid 2.8. Urinalysis showed cloudy urine with large leukocytes. Many bacteria WBC 21-50. Chest x-ray showed no active disease. CT abdomen pelvis showed bilateral renal enlargement with heterogeneous attenuation. Consistent with pyelonephritis. Patient is being admitted for treatment and early sepsis.   Assessment & Plan:   Principal Problem:   Sepsis ([name]) Active Problems:   Essential hypertension   Leucocytosis   Thrombocytosis (HCC)   UTI (urinary tract infection)   Chest pain   Bacteremia due to Escherichia coli   Dysphagia   Hypokalemia   Hypomagnesemia  1 sepsis secondary to E. coli bacteremia, E. coli pyelonephritis, POA Patient presented with some worsening shortness of breath, cough, generalized weakness, noted to be hypotensive with blood pressure of 81/52 on admission with a leukocytosis, hypoxia, CT abdomen and pelvis consistent  with pyelonephritis.  Patient admitted and pancultured.  Urinalysis with 30,000 colonies of E. coli which is pansensitive.  Blood cultures positive for E. coli.  Patient noted with intermittent fevers.  Patient still with elevated leukocytosis.  Will repeat blood cultures x2.  Continue empiric IV Ancef.  Follow.  2.  Hypertension Blood pressure stable.  Patient was on IV fluids due to decreased oral intake and acute renal failure.  Saline lock IV fluids due to concerns for pulmonary edema.  Continue HCTZ and lisinopril.  3.  Chest pain/pulmonary edema Patient with complaints of chest pain with deep inspiration.  Patient however refusing troponins to be drawn.  EKG ordered this morning with normal sinus rhythm.  Will repeat chest x-ray.  Question of GI related.  Patient given few doses of IV Lasix yesterday with a urine output of 7.650 L over the past 24 hours.  We will give Lasix 20 mg IV x1.  Supportive care.  Patient for probable upper endoscopy in the next 24 to 48 hours per GI.  Follow.  4.  Hypokalemia/hypomagnesemia Secondary to diuresis.  Replete.  5.  Iron deficiency anemia Patient with no overt bleeding.  Patient with complaints of upper epigastric pain.  Hemoglobin currently at 7.3.  Transfusion threshold hemoglobin less than 7.  Patient being followed by GI and patient for probable upper endoscopy in the next 24 to 48 hours.  May need IV iron.  Follow.  6.  Dysphagia/epigastric pain/nausea and vomiting Patient status post Roux-en-Y gastric bypass in 2012.  Continue Protonix 40 mg IV every 12 hours.  Patient being seen by GI and patient for upper endoscopy for further evaluation management hopefully in the next 24 to 48  hours.  7.  Acute on chronic low back pain Patient noted to have intractable pain which was not improved with home Neurontin.  Likely were secondary to acute pyelonephritis.  Currently on Dilaudid.  Change Dilaudid to 1 to 2 mg every 4 hours as needed for better pain  control.  Follow.  8.  Depression/anxiety Stable.  Continue Elavil.  Outpatient follow-up.  9.  Acute renal failure Creatinine peaked at 2.03.  Felt secondary to dehydration as patient noted to be hypotensive on admission.  Improved with hydration.  IV fluids have been saline locked due to concerns for possible pulmonary edema.  Follow.   DVT prophylaxis: SCDs Code Status: Full Family Communication: Updated patient.  No family at bedside. Disposition Plan: To be determined.  Likely home when clinically improved   Consultants:   Gastroenterology: Dr. Dr. Rush Landmark 02/05/2019  Procedures:   CT abdomen and pelvis 02/02/2019  Chest x-ray 02/02/2019, 02/05/2019, 02/07/2019  Antimicrobials:   IV Ancef 02/05/2019  IV cefepime 02/02/2019>>>> 02/03/2019  IV Rocephin  02/03/2019>>>>> 02/05/2019   Subjective: Patient with some complaints of cough and some intermittent chest pain with deep inspiration under the left breast.  Patient also with some complaints of cough.  Patient with some complaints of upper epigastric pain associated with liquid intake.  Patient asking whether pain medication could be adjusted.  Per RN patient refusing lab draws.  Objective: Vitals:   02/06/19 2013 02/07/19 0537 02/07/19 1343 02/07/19 1955  BP: 97/63 126/76  122/87  Pulse: 97 94  96  Resp: 20 18  20   Temp: 99.6 F (37.6 C) 99.6 F (37.6 C)  100.2 F (37.9 C)  TempSrc: Oral Oral  Oral  SpO2: 90% 90%  98%  Weight:   97.5 kg   Height:        Intake/Output Summary (Last 24 hours) at 02/07/2019 2122 Last data filed at 02/07/2019 2033 Gross per 24 hour  Intake 1359.44 ml  Output 5400 ml  Net -4040.56 ml   Filed Weights   02/02/19 1313 02/07/19 1343  Weight: 77.1 kg 97.5 kg    Examination:  General exam: Appears calm and comfortable  Respiratory system: Some scattered coarse diffuse breath sounds.  Speaking in full sentences.  No use of accessory muscles of respiration.  Normal respiratory  effort.   Cardiovascular system: S1 & S2 heard, RRR. No JVD, murmurs, rubs, gallops or clicks. No pedal edema. Gastrointestinal system: Abdomen is nondistended, soft and nontender. No organomegaly or masses felt. Normal bowel sounds heard. Central nervous system: Alert and oriented. No focal neurological deficits. Extremities: Symmetric 5 x 5 power. Skin: No rashes, lesions or ulcers Psychiatry: Judgement and insight appear normal. Mood & affect appropriate.     Data Reviewed: I have personally reviewed following labs and imaging studies  CBC: Recent Labs  Lab 02/02/19 1753 02/03/19 0516 02/04/19 0831 02/05/19 0610 02/07/19 0523  WBC 12.6* 10.7* 14.9* 17.2* 16.7*  NEUTROABS 11.3*  --   --   --   --   HGB 9.0* 8.5* 8.4* 7.9* 7.3*  HCT 27.5* 26.2* 25.8* 24.1* 21.7*  MCV 67.4* 68.6* 67.9* 66.9* 65.0*  PLT 119* 85* 73* 72* [id number]*   Basic Metabolic Panel: Recent Labs  Lab 02/02/19 1510 02/03/19 0516 02/04/19 0831 02/05/19 0610 02/07/19 0523  NA 136 139 138 139 138  K 3.4* 2.7* 3.5 3.5 2.5*  CL 99 106 109 111 104  CO2 23 21* 22 21* 25  GLUCOSE 53* 41* 80 70 82  BUN  30* 34* 41* 36* 28*  CREATININE 1.70* 1.71* 2.03* 1.87* 1.35*  CALCIUM 7.8* 7.3* 7.7* 7.9* 8.0*  MG  --  1.7  --   --  1.5*   GFR: Estimated Creatinine Clearance: 65.4 mL/min (A) (by C-G formula based on SCr of 1.35 mg/dL (H)). Liver Function Tests: Recent Labs  Lab 02/02/19 1510 02/03/19 0516 02/05/19 0610 02/07/19 0523  AST 33 29 23 16   ALT 18 20 16 6   ALKPHOS 117 102 158* 147*  BILITOT 1.9* 1.8* 1.1 0.8  PROT 5.7* 5.0* 4.6* 4.5*  ALBUMIN 2.5* 2.1* 1.7* 1.6*   No results for input(s): LIPASE, AMYLASE in the last 168 hours. No results for input(s): AMMONIA in the last 168 hours. Coagulation Profile: Recent Labs  Lab 02/02/19 1753  INR 1.1   Cardiac Enzymes: No results for input(s): CKTOTAL, CKMB, CKMBINDEX, TROPONINI in the last 168 hours. BNP (last 3 results) No results for input(s): PROBNP  in the last 8760 hours. HbA1C: No results for input(s): HGBA1C in the last 72 hours. CBG: Recent Labs  Lab 02/07/19 0744 02/07/19 1241 02/07/19 1321 02/07/19 1641 02/07/19 1950  GLUCAP 92 69* 95 102* 89   Lipid Profile: No results for input(s): CHOL, HDL, LDLCALC, TRIG, CHOLHDL, LDLDIRECT in the last 72 hours. Thyroid Function Tests: No results for input(s): TSH, T4TOTAL, FREET4, T3FREE, THYROIDAB in the last 72 hours. Anemia Panel: Recent Labs    02/05/19 1654  VITAMINB12 766  FOLATE 7.6  FERRITIN 98  TIBC 206*  IRON 5*  RETICCTPCT 0.1*   Sepsis Labs: Recent Labs  Lab 02/02/19 1510 02/02/19 1753 02/02/19 1848  LATICACIDVEN 2.8* 1.7 1.6    Recent Results (from the past 240 hour(s))  SARS Coronavirus 2 [facility] order, Performed in [facility] hospital lab) Nasopharyngeal Nasopharyngeal Swab     Status: None   Collection Time: 02/02/19  3:10 PM   Specimen: Nasopharyngeal Swab  Result Value Ref Range Status   SARS Coronavirus 2 NEGATIVE NEGATIVE Final    Comment: (NOTE) If result is NEGATIVE SARS-CoV-2 target nucleic acids are NOT DETECTED. The SARS-CoV-2 RNA is generally detectable in upper and lower  respiratory specimens during the acute phase of infection. The lowest  concentration of SARS-CoV-2 viral copies this assay can detect is 250  copies / mL. A negative result does not preclude SARS-CoV-2 infection  and should not be used as the sole basis for treatment or other  patient management decisions.  A negative result may occur with  improper specimen collection / handling, submission of specimen other  than nasopharyngeal swab, presence of viral mutation(s) within the  areas targeted by this assay, and inadequate number of viral copies  (<250 copies / mL). A negative result must be combined with clinical  observations, patient history, and epidemiological information. If result is POSITIVE SARS-CoV-2 target nucleic acids are DETECTED. The SARS-CoV-2 RNA  is generally detectable in upper and lower  respiratory specimens dur ing the acute phase of infection.  Positive  results are indicative of active infection with SARS-CoV-2.  Clinical  correlation with patient history and other diagnostic information is  necessary to determine patient infection status.  Positive results do  not rule out bacterial infection or co-infection with other viruses. If result is PRESUMPTIVE POSTIVE SARS-CoV-2 nucleic acids MAY BE PRESENT.   A presumptive positive result was obtained on the submitted specimen  and confirmed on repeat testing.  While 2019 novel coronavirus  (SARS-CoV-2) nucleic acids may be present in the submitted sample  additional confirmatory testing may be necessary for epidemiological  and / or clinical management purposes  to differentiate between  SARS-CoV-2 and other Sarbecovirus currently known to infect humans.  If clinically indicated additional testing with an alternate test  methodology [phone]) is advised. The SARS-CoV-2 RNA is generally  detectable in upper and lower respiratory sp ecimens during the acute  phase of infection. The expected result is Negative. Fact Sheet for Patients:  StrictlyIdeas.no Fact Sheet for Healthcare Providers: BankingDealers.co.za This test is not yet approved or cleared by the [place] FDA and has been authorized for detection and/or diagnosis of SARS-CoV-2 by FDA under an Emergency Use Authorization (EUA).  This EUA will remain in effect (meaning this test can be used) for the duration of the COVID-19 declaration under Section 564(b)(1) of the Act, 21 U.S.C. section 360bbb-3(b)(1), unless the authorization is terminated or revoked sooner. Performed at [facility], [place] [address] [place], [place] [address]   Urine Culture     Status: Abnormal   Collection Time: 02/02/19  3:10 PM   Specimen: Urine, Clean Catch  Result Value  Ref Range Status   Specimen Description   Final    URINE, CLEAN CATCH Performed at [facility], [place] [address], [place], [place] [address]    Special Requests   Final    NONE Performed at [facility], [place] [address], [place], [place] [address]    Culture 30,000 COLONIES/mL ESCHERICHIA COLI (A)  Final   Report Status 02/05/2019 FINAL  Final   Organism ID, Bacteria ESCHERICHIA COLI (A)  Final      Susceptibility   Escherichia coli - MIC*    AMPICILLIN <=2 SENSITIVE Sensitive     CEFAZOLIN <=4 SENSITIVE Sensitive     CEFTRIAXONE <=1 SENSITIVE Sensitive     CIPROFLOXACIN <=0.25 SENSITIVE Sensitive     GENTAMICIN <=1 SENSITIVE Sensitive     IMIPENEM <=0.25 SENSITIVE Sensitive     NITROFURANTOIN <=16 SENSITIVE Sensitive     TRIMETH/SULFA <=20 SENSITIVE Sensitive     AMPICILLIN/SULBACTAM <=2 SENSITIVE Sensitive     PIP/TAZO <=4 SENSITIVE Sensitive     Extended ESBL NEGATIVE Sensitive     * 30,000 COLONIES/mL ESCHERICHIA COLI  Blood Culture (routine x 2)     Status: Abnormal   Collection Time: 02/02/19  4:53 PM   Specimen: BLOOD LEFT FOREARM  Result Value Ref Range Status   Specimen Description   Final    BLOOD LEFT FOREARM Performed at [place] [address], [place], [place] [address]    Special Requests   Final    BOTTLES DRAWN AEROBIC AND ANAEROBIC Blood Culture adequate volume Performed at [place] [address], [place], [place] [address]    Culture  Setup Time   Final    GRAM NEGATIVE RODS IN BOTH AEROBIC AND ANAEROBIC BOTTLES CRITICAL RESULT CALLED TO, READ BACK BY AND VERIFIED WITH: PHARMD M LILLISTONE [id number] AT 1021 AM BY CM Performed at [facility], [place] [address] [place], [place] [address]    Culture ESCHERICHIA COLI (A)  Final   Report Status 02/05/2019 FINAL  Final   Organism ID, Bacteria ESCHERICHIA COLI  Final      Susceptibility   Escherichia coli - MIC*     AMPICILLIN <=2 SENSITIVE Sensitive     CEFAZOLIN <=4 SENSITIVE Sensitive     CEFEPIME <=1 SENSITIVE Sensitive     CEFTAZIDIME <=1 SENSITIVE Sensitive  CEFTRIAXONE <=1 SENSITIVE Sensitive     CIPROFLOXACIN <=0.25 SENSITIVE Sensitive     GENTAMICIN <=1 SENSITIVE Sensitive     IMIPENEM <=0.25 SENSITIVE Sensitive     TRIMETH/SULFA <=20 SENSITIVE Sensitive     AMPICILLIN/SULBACTAM <=2 SENSITIVE Sensitive     PIP/TAZO <=4 SENSITIVE Sensitive     Extended ESBL NEGATIVE Sensitive     * ESCHERICHIA COLI  Blood Culture ID Panel (Reflexed)     Status: Abnormal   Collection Time: 02/02/19  4:53 PM  Result Value Ref Range Status   Enterococcus species NOT DETECTED NOT DETECTED Final   Listeria monocytogenes NOT DETECTED NOT DETECTED Final   Staphylococcus species NOT DETECTED NOT DETECTED Final   Staphylococcus aureus (BCID) NOT DETECTED NOT DETECTED Final   Streptococcus species NOT DETECTED NOT DETECTED Final   Streptococcus agalactiae NOT DETECTED NOT DETECTED Final   Streptococcus pneumoniae NOT DETECTED NOT DETECTED Final   Streptococcus pyogenes NOT DETECTED NOT DETECTED Final   Acinetobacter baumannii NOT DETECTED NOT DETECTED Final   Enterobacteriaceae species DETECTED (A) NOT DETECTED Final    Comment: Enterobacteriaceae represent a large family of gram-negative bacteria, not a single organism. CRITICAL RESULT CALLED TO, READ BACK BY AND VERIFIED WITH: PHARMD M LILLISTONE [id number] AT 1022 BY CM    Enterobacter cloacae complex NOT DETECTED NOT DETECTED Final   Escherichia coli DETECTED (A) NOT DETECTED Final    Comment: CRITICAL RESULT CALLED TO, READ BACK BY AND VERIFIED WITH: PHARMD M LILLISTONE [id number] AT 53 AM BY CM    Klebsiella oxytoca NOT DETECTED NOT DETECTED Final   Klebsiella pneumoniae NOT DETECTED NOT DETECTED Final   Proteus species NOT DETECTED NOT DETECTED Final   Serratia marcescens NOT DETECTED NOT DETECTED Final   Carbapenem resistance NOT DETECTED NOT DETECTED  Final   Haemophilus influenzae NOT DETECTED NOT DETECTED Final   Neisseria meningitidis NOT DETECTED NOT DETECTED Final   Pseudomonas aeruginosa NOT DETECTED NOT DETECTED Final   Candida albicans NOT DETECTED NOT DETECTED Final   Candida glabrata NOT DETECTED NOT DETECTED Final   Candida krusei NOT DETECTED NOT DETECTED Final   Candida parapsilosis NOT DETECTED NOT DETECTED Final   Candida tropicalis NOT DETECTED NOT DETECTED Final    Comment: Performed at [facility], [place] [address] [place], [place] [address]  Blood Culture (routine x 2)     Status: Abnormal   Collection Time: 02/02/19  5:53 PM   Specimen: BLOOD  Result Value Ref Range Status   Specimen Description   Final    BLOOD RIGHT ANTECUBITAL Performed at [place] [address], [place], [place] [address]    Special Requests   Final    BOTTLES DRAWN AEROBIC AND ANAEROBIC Blood Culture results may not be optimal due to an excessive volume of blood received in culture bottles Performed at [place] [address] [place], [place] [address]    Culture  Setup Time   Final    GRAM NEGATIVE RODS IN BOTH AEROBIC AND ANAEROBIC BOTTLES CRITICAL RESULT CALLED TO, READ BACK BY AND VERIFIED WITH: PHARMD M LILLISTONE [id number] AT 33 AM BY CM    Culture (A)  Final    ESCHERICHIA COLI SUSCEPTIBILITIES PERFORMED ON PREVIOUS CULTURE WITHIN THE LAST 5 DAYS. Performed at [facility], [place] [address] [place], [place] [address]    Report Status 02/05/2019 FINAL  Final         Radiology Studies: Dg Chest 2 View  Result Date: 02/07/2019 CLINICAL DATA:  Abdominal pain,, fever, urosepsis EXAM: CHEST - 2 VIEW COMPARISON:  02/05/2019 FINDINGS: No significant interval change in examination with generally bandlike areas of atelectasis in the left lung, diffuse bilateral interstitial pulmonary opacity most consistent with edema, and small bilateral pleural effusions. There is no new  focal airspace opacity. IMPRESSION: No significant interval change in examination with generally bandlike areas of atelectasis in the left lung, diffuse bilateral interstitial pulmonary opacity most consistent with edema, and small bilateral pleural effusions. There is no new focal airspace opacity. Electronically Signed   By: Eddie Candle M.D.   On: 02/07/2019 15:55        Scheduled Meds: . amitriptyline  75 mg Oral QHS  . docusate sodium  100 mg Oral Daily  . gabapentin  300 mg Oral TID  . hydrochlorothiazide  12.5 mg Oral Daily  . insulin aspart  0-9 Units Subcutaneous Q4H  . lisinopril  10 mg Oral Daily  . magic mouthwash  10 mL Oral QID  . ondansetron (ZOFRAN) IV  4 mg Intravenous Q6H   Or  . ondansetron  4 mg Oral Q6H  . pantoprazole (PROTONIX) IV  40 mg Intravenous Q12H   Continuous Infusions: . sodium chloride 250 mL (02/07/19 0901)  .  ceFAZolin (ANCEF) IV 2 g (02/07/19 1328)     LOS: 5 days    Time spent: 35 minutes    Irine Seal, MD [facility]  If 7PM-7AM, please contact night-coverage [email or web address] 02/07/2019, 9:22 PM

## 2019-02-07 NOTE — H&P (View-Only) (Signed)
Patient ID: Terri [name], female   DOB: [birth year], 43 y.o.   MRN: [id number]    Progress Note   Subjective  Day # 4 CC : N/V , abd pain/fever/urosepsis  Still feeling horrible, wet cough and complaining of sharp pains all around her chest into her back, worse with breathing.  Diet was held this morning says she thinks she could eat.  Tmax 99.6 K+ 2.5 WBC 16.7, hgb 7.3, plts 110  Creat 1.3 Tbili 0.8,alk phos 147, AST 16 Repeat BC - P Refusing meds last pm , had refused labs yesterday    Objective   Vital signs in last 24 hours: Temp:  [99 F (37.2 C)-99.6 F (37.6 C)] 99.6 F (37.6 C) (09/16 0537) Pulse Rate:  [94-97] 94 (09/16 0537) Resp:  [18-20] 18 (09/16 0537) BP: (97-126)/(59-76) 126/76 (09/16 0537) SpO2:  [90 %-97 %] 90 % (09/16 0537) Last BM Date: 02/06/19 General:    white female in NAD, acute and chronically ill-appearing, coughing productive of clear to yellow sputum Heart:  Regular rate and rhythm; no murmurs Lungs: Respirations even , diffuse rhonchi, no tenderness of the chest wall to palpation Abdomen:  Soft, nontender and nondistended. Normal bowel sounds. Extremities:  Without edema. Neurologic:  Alert and oriented,  grossly normal neurologically. Psych:  Cooperative. Normal mood and affect.  Intake/Output from previous day: 09/15 0701 - 09/16 0700 In: 1672 [P.O.:845; I.V.:450; IV Piggyback:377] Out: 7650 [Urine:7650] Intake/Output this shift: No intake/output data recorded.  Lab Results: Recent Labs    02/05/19 0610 02/07/19 0523  WBC 17.2* 16.7*  HGB 7.9* 7.3*  HCT 24.1* 21.7*  PLT 72* 110*   BMET Recent Labs    02/05/19 0610 02/07/19 0523  NA 139 138  K 3.5 2.5*  CL 111 104  CO2 21* 25  GLUCOSE 70 82  BUN 36* 28*  CREATININE 1.87* 1.35*  CALCIUM 7.9* 8.0*   LFT Recent Labs    02/07/19 0523  PROT 4.5*  ALBUMIN 1.6*  AST 16  ALT 6  ALKPHOS 147*  BILITOT 0.8   PT/INR No results for input(s): LABPROT, INR in the last 72  hours.  Studies/Results: Dg Chest 2 View  Result Date: 02/05/2019 CLINICAL DATA:  43 year old female with a history of shortness of breath EXAM: CHEST - 2 VIEW COMPARISON:  February 02, 2019 FINDINGS: Cardiomediastinal silhouette unchanged in size and contour. New interlobular septal thickening compared to the prior with linear opacities of the bilateral lungs and thickening of the minor fissure. No pneumothorax. No pleural effusion. No confluent airspace disease. IMPRESSION: Evidence of early pulmonary edema. Electronically Signed   By: Corrie Mckusick D.O.   On: 02/05/2019 16:22       Assessment / Plan:    #28 43 year old white female admitted with urosepsis/pyelonephritis who has had recurrent fevers, and persistent leukocytosis.  On [facility] Chest x-ray 914 showed early pulmonary edema no infiltrate Continues to have significant wet hacking cough, now complaining of chest discomfort radiating around her chest bilaterally  Repeat blood cultures done and pending Temp curve down Acute kidney injury-improving  #2 nausea vomiting epigastric pain vague dysphasia. Patient is status post Roux-en-Y gastric bypass 2012  EGD 1 year ago with patent anastomosis, small gastric fistula mentioned  CT of the abdomen this admission showed a focally dilated loop of small bowel adjacent to the anastomosis of unclear clinical significance  #3 hypokalemia-correcting #4 anemia, most consistent with anemia of chronic disease #5 thrombocytopenia #6 bipolar disorder #7 status post cholecystectomy  and lysis of adhesions  Plan; full liquid diet today N.p.o. after midnight Continue twice daily PPI IV We will proceed with EGD/enteroscopy at 11:30 AM tomorrow with Dr. Rush Landmark     Principal Problem:   Sepsis [facility]) Active Problems:   Essential hypertension   Leucocytosis   Thrombocytosis ([place])   UTI (urinary tract infection)     LOS: 5 days   Ulises Wolfinger PA-C 02/07/2019, 10:23 AM

## 2019-02-07 NOTE — Progress Notes (Signed)
Patient refusing to allow lab to draw troponin level. Dr. Grandville Silos aware.

## 2019-02-08 ENCOUNTER — Encounter (HOSPITAL_COMMUNITY): Payer: Self-pay | Admitting: Gastroenterology

## 2019-02-08 ENCOUNTER — Inpatient Hospital Stay (HOSPITAL_COMMUNITY): Payer: Medicare Other

## 2019-02-08 ENCOUNTER — Inpatient Hospital Stay (HOSPITAL_COMMUNITY): Payer: Medicare Other | Admitting: Anesthesiology

## 2019-02-08 ENCOUNTER — Encounter (HOSPITAL_COMMUNITY)
Admission: EM | Disposition: A | Discharge status: Home / Self Care | Payer: Self-pay | Source: Home / Self Care | Attending: Internal Medicine

## 2019-02-08 DIAGNOSIS — Z980 Intestinal bypass and anastomosis status: Secondary | ICD-10-CM

## 2019-02-08 DIAGNOSIS — T182XXA Foreign body in stomach, initial encounter: Secondary | ICD-10-CM

## 2019-02-08 DIAGNOSIS — Z20822 Contact with and (suspected) exposure to covid-19: Secondary | ICD-10-CM | POA: Diagnosis present

## 2019-02-08 DIAGNOSIS — K316 Fistula of stomach and duodenum: Secondary | ICD-10-CM

## 2019-02-08 HISTORY — PX: BIOPSY: SHX5522

## 2019-02-08 HISTORY — PX: ENTEROSCOPY: SHX5533

## 2019-02-08 HISTORY — PX: SAVORY DILATION: SHX5439

## 2019-02-08 HISTORY — PX: ESOPHAGOGASTRODUODENOSCOPY (EGD) WITH PROPOFOL: SHX5813

## 2019-02-08 LAB — CBC WITH DIFFERENTIAL/PLATELET
Abs Immature Granulocytes: 0.24 10*3/uL — ABNORMAL HIGH (ref 0.00–0.07)
Basophils Absolute: 0.1 10*3/uL (ref 0.0–0.1)
Basophils Relative: 0 %
Eosinophils Absolute: 0.2 10*3/uL (ref 0.0–0.5)
Eosinophils Relative: 1 %
HCT: 25.6 % — ABNORMAL LOW (ref 36.0–46.0)
Hemoglobin: 8.3 g/dL — ABNORMAL LOW (ref 12.0–15.0)
Immature Granulocytes: 1 %
Lymphocytes Relative: 8 %
Lymphs Abs: 1.6 10*3/uL (ref 0.7–4.0)
MCH: 21.6 pg — ABNORMAL LOW (ref 26.0–34.0)
MCHC: 32.4 g/dL (ref 30.0–36.0)
MCV: 66.7 fL — ABNORMAL LOW (ref 80.0–100.0)
Monocytes Absolute: 1.2 10*3/uL — ABNORMAL HIGH (ref 0.1–1.0)
Monocytes Relative: 6 %
Neutro Abs: 16.4 10*3/uL — ABNORMAL HIGH (ref 1.7–7.7)
Neutrophils Relative %: 84 %
Platelets: 148 10*3/uL — ABNORMAL LOW (ref 150–400)
RBC: 3.84 MIL/uL — ABNORMAL LOW (ref 3.87–5.11)
RDW: 18.5 % — ABNORMAL HIGH (ref 11.5–15.5)
WBC: 19.7 10*3/uL — ABNORMAL HIGH (ref 4.0–10.5)
nRBC: 0 % (ref 0.0–0.2)

## 2019-02-08 LAB — BASIC METABOLIC PANEL
Anion gap: 10 (ref 5–15)
BUN: 21 mg/dL — ABNORMAL HIGH (ref 6–20)
CO2: 27 mmol/L (ref 22–32)
Calcium: 7.8 mg/dL — ABNORMAL LOW (ref 8.9–10.3)
Chloride: 102 mmol/L (ref 98–111)
Creatinine, Ser: 1.02 mg/dL — ABNORMAL HIGH (ref 0.44–1.00)
GFR calc Af Amer: >60 mL/min (ref >60)
GFR calc non Af Amer: >60 mL/min (ref >60)
Glucose, Bld: 86 mg/dL (ref 70–99)
Potassium: 3.5 mmol/L (ref 3.5–5.1)
Sodium: 139 mmol/L (ref 135–145)

## 2019-02-08 LAB — GLUCOSE, CAPILLARY
Glucose-Capillary: 113 mg/dL — ABNORMAL HIGH (ref 70–99)
Glucose-Capillary: 70 mg/dL (ref 70–99)
Glucose-Capillary: 74 mg/dL (ref 70–99)
Glucose-Capillary: 86 mg/dL (ref 70–99)
Glucose-Capillary: 92 mg/dL (ref 70–99)
Glucose-Capillary: 96 mg/dL (ref 70–99)

## 2019-02-08 LAB — MAGNESIUM: Magnesium: 1.9 mg/dL (ref 1.7–2.4)

## 2019-02-08 SURGERY — ESOPHAGOGASTRODUODENOSCOPY (EGD) WITH PROPOFOL
Anesthesia: Monitor Anesthesia Care

## 2019-02-08 MED ORDER — SODIUM CHLORIDE 0.9 % IV SOLN
2.0000 g | INTRAVENOUS | Status: DC
  Administered 2019-02-08 – 2019-02-10 (×3): 2 g via INTRAVENOUS
  Filled 2019-02-08: qty 20
  Filled 2019-02-08 (×3): qty 2

## 2019-02-08 MED ORDER — PHENYLEPHRINE HCL (PRESSORS) 10 MG/ML IV SOLN
INTRAVENOUS | Status: DC | PRN
  Administered 2019-02-08: 160 ug via INTRAVENOUS
  Administered 2019-02-08 (×2): 120 ug via INTRAVENOUS
  Administered 2019-02-08: 80 ug via INTRAVENOUS
  Administered 2019-02-08 (×3): 120 ug via INTRAVENOUS
  Administered 2019-02-08: 80 ug via INTRAVENOUS

## 2019-02-08 MED ORDER — PROPOFOL 10 MG/ML IV BOLUS
INTRAVENOUS | Status: AC
  Filled 2019-02-08: qty 40

## 2019-02-08 MED ORDER — EPHEDRINE SULFATE 50 MG/ML IJ SOLN
INTRAMUSCULAR | Status: DC | PRN
  Administered 2019-02-08: 10 mg via INTRAVENOUS
  Administered 2019-02-08: 15 mg via INTRAVENOUS

## 2019-02-08 MED ORDER — FUROSEMIDE 10 MG/ML IJ SOLN
20.0000 mg | Freq: Every day | INTRAMUSCULAR | Status: DC
  Administered 2019-02-08 – 2019-02-11 (×4): 20 mg via INTRAVENOUS
  Filled 2019-02-08 (×4): qty 2

## 2019-02-08 MED ORDER — POTASSIUM CHLORIDE CRYS ER 20 MEQ PO TBCR
40.0000 meq | EXTENDED_RELEASE_TABLET | Freq: Once | ORAL | Status: AC
  Administered 2019-02-08: 40 meq via ORAL
  Filled 2019-02-08: qty 2

## 2019-02-08 MED ORDER — IOHEXOL 300 MG/ML  SOLN
30.0000 mL | Freq: Once | INTRAMUSCULAR | Status: AC | PRN
  Administered 2019-02-08: 19:00:00 30 mL via ORAL

## 2019-02-08 MED ORDER — PROPOFOL 500 MG/50ML IV EMUL
INTRAVENOUS | Status: DC | PRN
  Administered 2019-02-08: 30 mg via INTRAVENOUS

## 2019-02-08 MED ORDER — IOHEXOL 300 MG/ML  SOLN
100.0000 mL | Freq: Once | INTRAMUSCULAR | Status: AC | PRN
  Administered 2019-02-08: 100 mL via INTRAVENOUS

## 2019-02-08 MED ORDER — LACTATED RINGERS IV SOLN
INTRAVENOUS | Status: DC
  Administered 2019-02-08: 1000 mL via INTRAVENOUS

## 2019-02-08 MED ORDER — PROPOFOL 500 MG/50ML IV EMUL
INTRAVENOUS | Status: DC | PRN
  Administered 2019-02-08: 150 ug/kg/min via INTRAVENOUS

## 2019-02-08 SURGICAL SUPPLY — 15 items

## 2019-02-08 NOTE — Care Management Important Message (Signed)
Important Message  Patient Details IM Letter given to Rhea Pink SW to present to the Patient Name: Terri [name] MRN: [id number] Date of Birth: 1975-09-05   Medicare Important Message Given:  Yes     Kerin Salen 02/08/2019, 10:25 AM

## 2019-02-08 NOTE — Interval H&P Note (Signed)
History and Physical Interval Note:  02/08/2019 11:11 AM  Terri [name]  has presented today for surgery, with the diagnosis of dysphagia, abdomianl pain, s/p gastric bypass.  The various methods of treatment have been discussed with the patient and family. After consideration of risks, benefits and other options for treatment, the patient has consented to  Procedure(s): ESOPHAGOGASTRODUODENOSCOPY (EGD) WITH PROPOFOL (N/A) ENTEROSCOPY (N/A) as a surgical intervention.  The patient's history has been reviewed, patient examined, no change in status, stable for surgery.  I have reviewed the patient's chart and labs.  Questions were answered to the patient's satisfaction.     [employer]

## 2019-02-08 NOTE — Anesthesia Postprocedure Evaluation (Signed)
Anesthesia Post Note  Patient: KAILONI VAHLE  Procedure(s) Performed: ESOPHAGOGASTRODUODENOSCOPY (EGD) WITH PROPOFOL (N/A ) ENTEROSCOPY (N/A ) SAVORY DILATION (N/A )     Patient location during evaluation: Endoscopy Anesthesia Type: MAC Level of consciousness: awake and alert Pain management: pain level controlled Vital Signs Assessment: post-procedure vital signs reviewed and stable Respiratory status: spontaneous breathing, nonlabored ventilation, respiratory function stable and patient connected to nasal cannula oxygen Cardiovascular status: blood pressure returned to baseline and stable Postop Assessment: no apparent nausea or vomiting Anesthetic complications: no    Last Vitals:  Vitals:   02/08/19 1300 02/08/19 1318  BP: 112/63 100/65  Pulse: 93 86  Resp: 20 18  Temp:  36.8 C  SpO2: 92% 95%    Last Pain:  Vitals:   02/08/19 1438  TempSrc:   PainSc: 10-Worst pain ever                 Barnet Glasgow

## 2019-02-08 NOTE — Anesthesia Preprocedure Evaluation (Addendum)
Anesthesia Evaluation  Patient identified by MRN, date of birth, ID band Patient awake    Reviewed: Allergy & Precautions, NPO status , Patient's Chart, lab work & pertinent test results  Airway Mallampati: I  TM Distance: >3 FB Neck ROM: Full    Dental no notable dental hx. (+) Edentulous Lower, Edentulous Upper   Pulmonary pneumonia, Current Smoker,    Pulmonary exam normal breath sounds clear to auscultation       Cardiovascular Exercise Tolerance: Good hypertension, Pt. on medications Normal cardiovascular exam Rhythm:Regular Rate:Normal     Neuro/Psych  Headaches, Bipolar Disorder    GI/Hepatic negative GI ROS,   Endo/Other  negative endocrine ROS  Renal/GU negative Renal ROS  negative genitourinary   Musculoskeletal  (+) Arthritis ,   Abdominal   Peds  Hematology   Anesthesia Other Findings   Reproductive/Obstetrics                             Anesthesia Physical Anesthesia Plan  ASA: IV  Anesthesia Plan: MAC   Post-op Pain Management:    Induction:   PONV Risk Score and Plan:   Airway Management Planned: Nasal Cannula and Natural Airway  Additional Equipment:   Intra-op Plan:   Post-operative Plan:   Informed Consent: I have reviewed the patients History and Physical, chart, labs and discussed the procedure including the risks, benefits and alternatives for the proposed anesthesia with the patient or authorized representative who has indicated his/her understanding and acceptance.     Dental advisory given  Plan Discussed with:   Anesthesia Plan Comments:         Anesthesia Quick Evaluation

## 2019-02-08 NOTE — Transfer of Care (Signed)
Immediate Anesthesia Transfer of Care Note  Patient: Terri [name]  Procedure(s) Performed: ESOPHAGOGASTRODUODENOSCOPY (EGD) WITH PROPOFOL (N/A ) ENTEROSCOPY (N/A ) SAVORY DILATION (N/A )  Patient Location: PACU  Anesthesia Type:MAC  Level of Consciousness: awake, alert  and oriented  Airway & Oxygen Therapy: Patient Spontanous Breathing and Patient connected to nasal cannula oxygen  Post-op Assessment: Report given to RN and Post -op Vital signs reviewed and stable  Post vital signs: Reviewed and stable  Last Vitals:  Vitals Value Taken Time  BP 110/59 02/08/19 1251  Temp 36.5 C 02/08/19 1251  Pulse 95 02/08/19 1252  Resp 26 02/08/19 1252  SpO2 95 % 02/08/19 1252  Vitals shown include unvalidated device data.  Last Pain:  Vitals:   02/08/19 1251  TempSrc: Oral  PainSc: 0-No pain      Patients Stated Pain Goal: 5 (27/51/70 0174)  Complications: No apparent anesthesia complications

## 2019-02-08 NOTE — Progress Notes (Signed)
PROGRESS NOTE    Terri [name]  [id number] DOB: [birth year] DOA: 02/02/2019 PCP: [name], MD    Brief Narrative:  43 y.o.femalewith medical history significant ofdepression, bipolar disorder, chronic back pain, hypertension, previous DVT, migraine headaches and previous pneumonia who presented to the ER with complaint of shortness of breath cough generalized weakness. Patient was seen and evaluated. She has had some chills headaches with vomiting and diarrhea over the last 4 days. She has been taking Tylenol and NyQuil but no response. Patient was found to be mildly hypoxic on arrival and placed on oxygen. She had elevated lactic acid and symptoms suggestive of sepsis. Chest x-ray was not revealing. Urinalysis suggested UTI as possible cause of her sepsis. Patient is being admitted to the hospital for work-up..  ED Course:Temperature is 99.9 blood pressure 81/52 pulse 115 respirate of 32 oxygen sat 91% on 2 L. White count is 12.6 hemoglobin 9.1 platelets 119. Potassium 3.4 creatinine 1.7 BUN 30 and calcium of 7.8. Lactic acid 2.8. Urinalysis showed cloudy urine with large leukocytes. Many bacteria WBC 21-50. Chest x-ray showed no active disease. CT abdomen pelvis showed bilateral renal enlargement with heterogeneous attenuation. Consistent with pyelonephritis. Patient is being admitted for treatment and early sepsis.   Assessment & Plan:   Principal Problem:   Sepsis ([place]) Active Problems:   Essential hypertension   Leucocytosis   Thrombocytosis (HCC)   UTI (urinary tract infection)   Chest pain   Bacteremia due to Escherichia coli   Dysphagia   Hypokalemia   Hypomagnesemia   Covid-19 Virus not Detected  1 sepsis secondary to E. coli bacteremia, E. coli pyelonephritis, POA Patient presented with some worsening shortness of breath, cough, generalized weakness, noted to be hypotensive with blood pressure of 81/52 on admission with a leukocytosis, hypoxia,  CT abdomen and pelvis consistent with pyelonephritis.  Patient admitted and pancultured.  Urinalysis with 30,000 colonies of E. coli which is pansensitive.  Blood cultures positive for E. coli.  Patient with ongoing fevers.  Patient with a worsening leukocytosis.  Repeat blood cultures done on 02/07/2019 with no growth to date.  Will repeat UA with cultures and sensitivities.  Check a CT abdomen and pelvis to rule out perinephric abscess.  Change IV antibiotics from IV ancef to IV Rocephin.  Will consult with ID for further evaluation and management.  Follow.  2.  Hypertension Blood pressure stable.  Patient was on IV fluids due to decreased oral intake and acute renal failure..  Blood pressure currently stable.  Continue lisinopril, HCTZ.  Patient started on IV Lasix.   3.  Chest pain/pulmonary edema Patient with complaints of chest pain with deep inspiration.  Patient however refusing troponins to be drawn.  EKG ordered with normal sinus rhythm.  Rate done with diffuse bilateral interstitial pulmonary opacity most consistent with edema and small bilateral effusions.  Patient received 20 mg IV Lasix yesterday with a urine output of 2.5 L.  Place on Lasix 20 mg IV daily.  Follow.  4.  Hypokalemia/hypomagnesemia Secondary to diuresis.  Magnesium at 1.9.  K. Dur 40 mEq p.o. x1.   5.  Iron deficiency anemia Patient with no overt bleeding.  Patient with complaints of upper epigastric pain.  Hemoglobin currently at 8.3.  Transfusion threshold hemoglobin less than 7.  Patient being followed by GI and patient status post upper endoscopy on 02/08/2019 which showed nummular lesions in the esophageal mucosa which were biopsied, LA grade a esophagitis.  Dilatation of entire esophagus.  Evidence  of previous gastric surgery found characterized by inflammation and erythema which was biopsied.  Nonbleeding gastric ulcer noted with clean ulcer base which was biopsied.  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   Gastrojejunostomy found characterized by ulceration, intact staple line and visible sutures.  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  Staples and sutures removed.  Gastrojejunostomy found with healthy-appearing mucosa which was traversed.  Normal mucosa found in jejunum which was biopsied.  Could not reach enteroenterostomy.  Patient currently with fevers and as such we will hold off on giving IV iron.  May need IV iron once defervesced with improvement with active acute infection.  Follow.  6.  Dysphagia/epigastric pain/nausea and vomiting Patient status post Roux-en-Y gastric bypass in 2012.  Continue Protonix 40 mg IV every 12 hours.  Patient being seen by GI and patient underwent upper endoscopy this morning 02/08/2019 which showed nummular lesions in the esophageal mucosa which were biopsied, LA grade a esophagitis.  Dilatation of entire esophagus.  Evidence of previous gastric surgery found characterized by inflammation and erythema which was biopsied.  Nonbleeding gastric ulcer noted with clean ulcer base which was biopsied.  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  Gastrojejunostomy found characterized by ulceration, intact staple line and visible sutures.  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  Staples and sutures removed.  Gastrojejunostomy found with healthy-appearing mucosa which was traversed.  Normal mucosa found in jejunum which was biopsied.  Could not reach enteroenterostomy.  Per GI.    7.  Acute on chronic low back pain Patient noted to have intractable pain which was not improved with home Neurontin.  Likely were secondary to acute pyelonephritis.  Currently on Dilaudid as needed.  8.  Depression/anxiety Stable.  Continue Elavil.  Outpatient follow-up.  9.  Acute renal failure Creatinine peaked at 2.03.  Felt secondary to dehydration as patient noted to be hypotensive on admission.  Improved with hydration.  IV fluids have been saline locked due to  concerns for possible pulmonary edema.  Renal function improving daily..  Follow.   DVT prophylaxis: SCDs Code Status: Full Family Communication: Updated patient.  No family at bedside. Disposition Plan: To be determined.  Likely home when clinically improved, afebrile x24 to 48 hours, improvement with leukocytosis.   Consultants:   Gastroenterology: Dr. Rush Landmark 02/05/2019  Procedures:   CT abdomen and pelvis 02/02/2019  Chest x-ray 02/02/2019, 02/05/2019, 02/07/2019  Upper endoscopy per Dr. Rush Landmark 02/08/2019  Antimicrobials:   IV Ancef 02/05/2019>>>>>> 02/08/2019  IV cefepime 02/02/2019>>>> 02/03/2019  IV Rocephin  02/03/2019>>>>> 02/05/2019  IV Rocephin 02/08/2019   Subjective: Patient just returned from upper endoscopy.  Patient states some improvement with intermittent chest pain with diuresis yesterday.  Still complaining of some upper epigastric pain and some chest pain.  Feels back pain overall slowly improving.  Objective: Vitals:   02/08/19 1300 02/08/19 1318 02/08/19 1659 02/08/19 1742  BP: 112/63 100/65  136/82  Pulse: 93 86  (!) 103  Resp: 20 18  20   Temp:  98.3 F (36.8 C) 100 F (37.8 C) (!) 101.4 F (38.6 C)  TempSrc:  Oral Oral Oral  SpO2: 92% 95%  93%  Weight:      Height:        Intake/Output Summary (Last 24 hours) at 02/08/2019 1820 Last data filed at 02/08/2019 1628 Gross per 24 hour  Intake 1438.28 ml  Output 3000 ml  Net -1561.72 ml   [employer]  02/07/19 1343 02/08/19 0413 02/08/19 1048  Weight: 97.5 kg 97.6 kg 97.6 kg    Examination:  General exam: NAD Respiratory system: Decreased scattered coarse breath sounds.  Speaking in full sentences.  No use of accessory muscles of respiration.  Normal respiratory effort.  Cardiovascular system: Regular rate rhythm no murmurs rubs or gallops.  No JVD.  No lower extremity edema.  Gastrointestinal system: Abdomen is soft, nontender, nondistended, positive bowel sounds.  No rebound.  No  guarding.  Central nervous system: Alert and oriented. No focal neurological deficits. Extremities: Symmetric 5 x 5 power. Skin: No rashes, lesions or ulcers Psychiatry: Judgement and insight appear normal. Mood & affect appropriate.     Data Reviewed: I have personally reviewed following labs and imaging studies  CBC: Recent Labs  Lab 02/02/19 1753 02/03/19 0516 02/04/19 0831 02/05/19 0610 02/07/19 0523 02/08/19 0458  WBC 12.6* 10.7* 14.9* 17.2* 16.7* 19.7*  NEUTROABS 11.3*  --   --   --   --  16.4*  HGB 9.0* 8.5* 8.4* 7.9* 7.3* 8.3*  HCT 27.5* 26.2* 25.8* 24.1* 21.7* 25.6*  MCV 67.4* 68.6* 67.9* 66.9* 65.0* 66.7*  PLT 119* 85* 73* 72* 110* [id number]*   Basic Metabolic Panel: Recent Labs  Lab 02/03/19 0516 02/04/19 0831 02/05/19 0610 02/07/19 0523 02/08/19 0458  NA 139 138 139 138 139  K 2.7* 3.5 3.5 2.5* 3.5  CL 106 109 111 104 102  CO2 21* 22 21* 25 27  GLUCOSE 41* 80 70 82 86  BUN 34* 41* 36* 28* 21*  CREATININE 1.71* 2.03* 1.87* 1.35* 1.02*  CALCIUM 7.3* 7.7* 7.9* 8.0* 7.8*  MG 1.7  --   --  1.5* 1.9   GFR: Estimated Creatinine Clearance: 86.6 mL/min (A) (by C-G formula based on SCr of 1.02 mg/dL (H)). Liver Function Tests: Recent Labs  Lab 02/02/19 1510 02/03/19 0516 02/05/19 0610 02/07/19 0523  AST 33 29 23 16   ALT 18 20 16 6   ALKPHOS 117 102 158* 147*  BILITOT 1.9* 1.8* 1.1 0.8  PROT 5.7* 5.0* 4.6* 4.5*  ALBUMIN 2.5* 2.1* 1.7* 1.6*   No results for input(s): LIPASE, AMYLASE in the last 168 hours. No results for input(s): AMMONIA in the last 168 hours. Coagulation Profile: Recent Labs  Lab 02/02/19 1753  INR 1.1   Cardiac Enzymes: No results for input(s): CKTOTAL, CKMB, CKMBINDEX, TROPONINI in the last 168 hours. BNP (last 3 results) No results for input(s): PROBNP in the last 8760 hours. HbA1C: No results for input(s): HGBA1C in the last 72 hours. CBG: Recent Labs  Lab 02/07/19 2354 02/08/19 0408 02/08/19 0745 02/08/19 1348  02/08/19 1657  GLUCAP 110* 70 96 92 74   Lipid Profile: No results for input(s): CHOL, HDL, LDLCALC, TRIG, CHOLHDL, LDLDIRECT in the last 72 hours. Thyroid Function Tests: No results for input(s): TSH, T4TOTAL, FREET4, T3FREE, THYROIDAB in the last 72 hours. Anemia Panel: No results for input(s): VITAMINB12, FOLATE, FERRITIN, TIBC, IRON, RETICCTPCT in the last 72 hours. Sepsis Labs: Recent Labs  Lab 02/02/19 1510 02/02/19 1753 02/02/19 1848  LATICACIDVEN 2.8* 1.7 1.6    Recent Results (from the past 240 hour(s))  SARS Coronavirus 2 [facility] order, Performed in [facility] hospital lab) Nasopharyngeal Nasopharyngeal Swab     Status: None   Collection Time: 02/02/19  3:10 PM   Specimen: Nasopharyngeal Swab  Result Value Ref Range Status   SARS Coronavirus 2 NEGATIVE NEGATIVE Final    Comment: (NOTE) If result is NEGATIVE SARS-CoV-2 target nucleic  acids are NOT DETECTED. The SARS-CoV-2 RNA is generally detectable in upper and lower  respiratory specimens during the acute phase of infection. The lowest  concentration of SARS-CoV-2 viral copies this assay can detect is 250  copies / mL. A negative result does not preclude SARS-CoV-2 infection  and should not be used as the sole basis for treatment or other  patient management decisions.  A negative result may occur with  improper specimen collection / handling, submission of specimen other  than nasopharyngeal swab, presence of viral mutation(s) within the  areas targeted by this assay, and inadequate number of viral copies  (<250 copies / mL). A negative result must be combined with clinical  observations, patient history, and epidemiological information. If result is POSITIVE SARS-CoV-2 target nucleic acids are DETECTED. The SARS-CoV-2 RNA is generally detectable in upper and lower  respiratory specimens dur ing the acute phase of infection.  Positive  results are indicative of active infection with SARS-CoV-2.  Clinical   correlation with patient history and other diagnostic information is  necessary to determine patient infection status.  Positive results do  not rule out bacterial infection or co-infection with other viruses. If result is PRESUMPTIVE POSTIVE SARS-CoV-2 nucleic acids MAY BE PRESENT.   A presumptive positive result was obtained on the submitted specimen  and confirmed on repeat testing.  While 2019 novel coronavirus  (SARS-CoV-2) nucleic acids may be present in the submitted sample  additional confirmatory testing may be necessary for epidemiological  and / or clinical management purposes  to differentiate between  SARS-CoV-2 and other Sarbecovirus currently known to infect humans.  If clinically indicated additional testing with an alternate test  methodology [phone]) is advised. The SARS-CoV-2 RNA is generally  detectable in upper and lower respiratory sp ecimens during the acute  phase of infection. The expected result is Negative. Fact Sheet for Patients:  StrictlyIdeas.no Fact Sheet for Healthcare Providers: BankingDealers.co.za This test is not yet approved or cleared by the [place] FDA and has been authorized for detection and/or diagnosis of SARS-CoV-2 by FDA under an Emergency Use Authorization (EUA).  This EUA will remain in effect (meaning this test can be used) for the duration of the COVID-19 declaration under Section 564(b)(1) of the Act, 21 U.S.C. section 360bbb-3(b)(1), unless the authorization is terminated or revoked sooner. Performed at [facility], [place] [address] [place], [place] [address]   Urine Culture     Status: Abnormal   Collection Time: 02/02/19  3:10 PM   Specimen: Urine, Clean Catch  Result Value Ref Range Status   Specimen Description   Final    URINE, CLEAN CATCH Performed at [facility], [place] [address], [place], [place] [address]    Special Requests    Final    NONE Performed at [facility], [place] [address], [place], [place] [address]    Culture 30,000 COLONIES/mL ESCHERICHIA COLI (A)  Final   Report Status 02/05/2019 FINAL  Final   Organism ID, Bacteria ESCHERICHIA COLI (A)  Final      Susceptibility   Escherichia coli - MIC*    AMPICILLIN <=2 SENSITIVE Sensitive     CEFAZOLIN <=4 SENSITIVE Sensitive     CEFTRIAXONE <=1 SENSITIVE Sensitive     CIPROFLOXACIN <=0.25 SENSITIVE Sensitive     GENTAMICIN <=1 SENSITIVE Sensitive     IMIPENEM <=0.25 SENSITIVE Sensitive     NITROFURANTOIN <=16 SENSITIVE Sensitive     TRIMETH/SULFA <=20 SENSITIVE Sensitive  AMPICILLIN/SULBACTAM <=2 SENSITIVE Sensitive     PIP/TAZO <=4 SENSITIVE Sensitive     Extended ESBL NEGATIVE Sensitive     * 30,000 COLONIES/mL ESCHERICHIA COLI  Blood Culture (routine x 2)     Status: Abnormal   Collection Time: 02/02/19  4:53 PM   Specimen: BLOOD LEFT FOREARM  Result Value Ref Range Status   Specimen Description   Final    BLOOD LEFT FOREARM Performed at [place] [address] [place], [place] [address]    Special Requests   Final    BOTTLES DRAWN AEROBIC AND ANAEROBIC Blood Culture adequate volume Performed at [place] [address], [place], [place] [address]    Culture  Setup Time   Final    GRAM NEGATIVE RODS IN BOTH AEROBIC AND ANAEROBIC BOTTLES CRITICAL RESULT CALLED TO, READ BACK BY AND VERIFIED WITH: PHARMD M LILLISTONE [id number] AT 1021 AM BY CM Performed at [facility], [place] [address] [place], [place] [address]    Culture ESCHERICHIA COLI (A)  Final   Report Status 02/05/2019 FINAL  Final   Organism ID, Bacteria ESCHERICHIA COLI  Final      Susceptibility   Escherichia coli - MIC*    AMPICILLIN <=2 SENSITIVE Sensitive     CEFAZOLIN <=4 SENSITIVE Sensitive     CEFEPIME <=1 SENSITIVE Sensitive     CEFTAZIDIME <=1 SENSITIVE Sensitive     CEFTRIAXONE <=1 SENSITIVE  Sensitive     CIPROFLOXACIN <=0.25 SENSITIVE Sensitive     GENTAMICIN <=1 SENSITIVE Sensitive     IMIPENEM <=0.25 SENSITIVE Sensitive     TRIMETH/SULFA <=20 SENSITIVE Sensitive     AMPICILLIN/SULBACTAM <=2 SENSITIVE Sensitive     PIP/TAZO <=4 SENSITIVE Sensitive     Extended ESBL NEGATIVE Sensitive     * ESCHERICHIA COLI  Blood Culture ID Panel (Reflexed)     Status: Abnormal   Collection Time: 02/02/19  4:53 PM  Result Value Ref Range Status   Enterococcus species NOT DETECTED NOT DETECTED Final   Listeria monocytogenes NOT DETECTED NOT DETECTED Final   Staphylococcus species NOT DETECTED NOT DETECTED Final   Staphylococcus aureus (BCID) NOT DETECTED NOT DETECTED Final   Streptococcus species NOT DETECTED NOT DETECTED Final   Streptococcus agalactiae NOT DETECTED NOT DETECTED Final   Streptococcus pneumoniae NOT DETECTED NOT DETECTED Final   Streptococcus pyogenes NOT DETECTED NOT DETECTED Final   Acinetobacter baumannii NOT DETECTED NOT DETECTED Final   Enterobacteriaceae species DETECTED (A) NOT DETECTED Final    Comment: Enterobacteriaceae represent a large family of gram-negative bacteria, not a single organism. CRITICAL RESULT CALLED TO, READ BACK BY AND VERIFIED WITH: PHARMD M LILLISTONE [id number] AT 1022 BY CM    Enterobacter cloacae complex NOT DETECTED NOT DETECTED Final   Escherichia coli DETECTED (A) NOT DETECTED Final    Comment: CRITICAL RESULT CALLED TO, READ BACK BY AND VERIFIED WITH: PHARMD M LILLISTONE [id number] AT 27 AM BY CM    Klebsiella oxytoca NOT DETECTED NOT DETECTED Final   Klebsiella pneumoniae NOT DETECTED NOT DETECTED Final   Proteus species NOT DETECTED NOT DETECTED Final   Serratia marcescens NOT DETECTED NOT DETECTED Final   Carbapenem resistance NOT DETECTED NOT DETECTED Final   Haemophilus influenzae NOT DETECTED NOT DETECTED Final   Neisseria meningitidis NOT DETECTED NOT DETECTED Final   Pseudomonas aeruginosa NOT DETECTED NOT DETECTED Final    Candida albicans NOT DETECTED NOT DETECTED Final   Candida glabrata NOT DETECTED NOT DETECTED  Final   Candida krusei NOT DETECTED NOT DETECTED Final   Candida parapsilosis NOT DETECTED NOT DETECTED Final   Candida tropicalis NOT DETECTED NOT DETECTED Final    Comment: Performed at [facility], [place] [address], [place], [place] [address]  Blood Culture (routine x 2)     Status: Abnormal   Collection Time: 02/02/19  5:53 PM   Specimen: BLOOD  Result Value Ref Range Status   Specimen Description   Final    BLOOD RIGHT ANTECUBITAL Performed at [place] [address] [place], [place] [address]    Special Requests   Final    BOTTLES DRAWN AEROBIC AND ANAEROBIC Blood Culture results may not be optimal due to an excessive volume of blood received in culture bottles Performed at [place] [address], [place], [place] [address]    Culture  Setup Time   Final    GRAM NEGATIVE RODS IN BOTH AEROBIC AND ANAEROBIC BOTTLES CRITICAL RESULT CALLED TO, READ BACK BY AND VERIFIED WITH: PHARMD M LILLISTONE [id number] AT 80 AM BY CM    Culture (A)  Final    ESCHERICHIA COLI SUSCEPTIBILITIES PERFORMED ON PREVIOUS CULTURE WITHIN THE LAST 5 DAYS. Performed at [facility], [place] [address] [place], [place] [address]    Report Status 02/05/2019 FINAL  Final  Culture, blood (Routine X 2) w Reflex to ID Panel     Status: None (Preliminary result)   Collection Time: 02/07/19 10:40 AM   Specimen: BLOOD  Result Value Ref Range Status   Specimen Description   Final    BLOOD RIGHT ARM Performed at [place] [address] [place], [place] [address]    Special Requests   Final    BOTTLES DRAWN AEROBIC ONLY Blood Culture adequate volume Performed at [place] [address], [place], [place] [address]    Culture   Final    NO GROWTH < 24 HOURS Performed at [place] [address]  [place], [place] [address]    Report Status PENDING  Incomplete  Culture, blood (Routine X 2) w Reflex to ID Panel     Status: None (Preliminary result)   Collection Time: 02/07/19 10:45 AM   Specimen: BLOOD LEFT HAND  Result Value Ref Range Status   Specimen Description   Final    BLOOD LEFT HAND Performed at [facility] [address], [place], [place] [address]    Special Requests   Final    BOTTLES DRAWN AEROBIC ONLY Blood Culture results may not be optimal due to an inadequate volume of blood received in culture bottles Performed at [place] [address] [place], [place] [address]    Culture   Final    NO GROWTH < 24 HOURS Performed at [facility] [address] [place], [place] [address]    Report Status PENDING  Incomplete         Radiology Studies: Dg Chest 2 View  Result Date: 02/07/2019 CLINICAL DATA:  Abdominal pain,, fever, urosepsis EXAM: CHEST - 2 VIEW COMPARISON:  02/05/2019 FINDINGS: No significant interval change in examination with generally bandlike areas of atelectasis in the left lung, diffuse bilateral interstitial pulmonary opacity most consistent with edema, and small bilateral pleural effusions. There is no new focal airspace opacity. IMPRESSION: No significant interval change in examination with generally bandlike areas of atelectasis in the left lung, diffuse bilateral  interstitial pulmonary opacity most consistent with edema, and small bilateral pleural effusions. There is no new focal airspace opacity. Electronically Signed   By: Eddie Candle M.D.   On: 02/07/2019 15:55        Scheduled Meds: . amitriptyline  75 mg Oral QHS  . docusate sodium  100 mg Oral Daily  . furosemide  20 mg Intravenous Daily  . gabapentin  300 mg Oral TID  . hydrochlorothiazide  12.5 mg Oral Daily  . insulin aspart  0-9 Units Subcutaneous Q4H  . lisinopril  10 mg Oral Daily  . magic mouthwash  10 mL Oral QID  .  ondansetron (ZOFRAN) IV  4 mg Intravenous Q6H   Or  . ondansetron  4 mg Oral Q6H  . pantoprazole (PROTONIX) IV  40 mg Intravenous Q12H   Continuous Infusions: . sodium chloride 250 mL (02/07/19 0901)  . cefTRIAXone (ROCEPHIN)  IV       LOS: 6 days    Time spent: 40 minutes    Irine Seal, MD [facility]  If 7PM-7AM, please contact night-coverage [email or web address] 02/08/2019, 6:20 PM

## 2019-02-08 NOTE — Op Note (Signed)
[facility] Patient Name: Terri [name] Procedure Date: 02/08/2019 MRN: [id number] Attending MD: Justice Britain , MD Date of Birth: 05/05/1976 CSN: [id number] Age: 43 Admit Type: Inpatient Procedure:                Upper GI endoscopy Indications:              Generalized abdominal pain, Dysphagia, Heartburn,                            Exclusion of peptic ulcer, Abnormal CT of the GI                            tract Providers:                Justice Britain, MD, Glori Bickers, RN, Marguerita Merles, Technician Referring MD:             Dr. Grandville Silos ([facility]) Medicines:                Monitored Anesthesia Care Complications:            No immediate complications. Estimated Blood Loss:     Estimated blood loss was minimal. Procedure:                Pre-Anesthesia Assessment:                           - Prior to the procedure, a History and Physical                            was performed, and patient medications and                            allergies were reviewed. The patient's tolerance of                            previous anesthesia was also reviewed. The risks                            and benefits of the procedure and the sedation                            options and risks were discussed with the patient.                            All questions were answered, and informed consent                            was obtained. Prior Anticoagulants: The patient has                            taken no previous anticoagulant or antiplatelet                            agents.  ASA Grade Assessment: III - A patient with                            severe systemic disease. After reviewing the risks                            and benefits, the patient was deemed in                            satisfactory condition to undergo the procedure.                           After obtaining informed consent, the endoscope was   passed under direct vision. Throughout the                            procedure, the patient's blood pressure, pulse, and                            oxygen saturations were monitored continuously. The                            GIF-H190 ([id number]) Olympus gastroscope was                            introduced through the mouth, and advanced to the                            jejunum. The upper GI endoscopy was accomplished                            without difficulty. The patient tolerated the                            procedure. The PCF-H190DL ([id number] Olympus                            pediatric colonscope was introduced through the                            mouth, and advanced to the jejunum. Scope In: Scope Out: Findings:      White nummular lesions were noted in the proximal esophagus and in the       mid esophagus. Biopsies were taken with a cold forceps for histology to       rule out Candida but also EoE.      LA Grade A (one or more mucosal breaks less than 5 mm, not extending       between tops of 2 mucosal folds) esophagitis was found at the       gastroesophageal junction.      A guidewire was placed and the scope was withdrawn. Dilation was       performed in the entire esophagus with a Savary dilator with no       resistance at 16 mm and 17 mm and mild resistance at 18 mm. The dilation  site was examined following endoscope reinsertion and showed moderate       mucosal disruption at the UES but no perforation.      Evidence of previous surgery was found in the gastric cardia/fundus.       This was characterized by erythema and inflammation. Biopsies were taken       with a cold forceps for histology and Helicobacter pylori testing.      One non-bleeding cratered gastric ulcer with a clean ulcer base (Forrest       Class III) was found within this region of the previous gastric       cardia/fundus. The lesion was 10 mm in largest dimension. Biopsies were       taken  with a cold forceps for histology.      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 This was characterized by ulceration, an intact staple line       and visible sutures. Removal of a staples and sutures was accomplished       with a regular forceps. 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      Evidence of a second gastrojejunostomy was found in the more distal       gastric body. This was characterized by healthy appearing mucosa.       Biopsies were taken with a cold forceps for histology. This was       traversed into the jejunum.      Normal mucosa was found in the jejunum. I got to a point where bilious       output was noted but I was not able to go deeper into the small bowel as       a result of significant looping. I could not find the enteroenterostomy.       Biopsies were taken with a cold forceps for histology to rule out       enteropathy. Impression:               - White nummular lesions in esophageal mucosa.                            Biopsied. LA Grade A esophagitis. - Dilation                            performed in the entire esophagus.                           - Evidence of previous gastric surgery was found,                            characterized by inflammation and erythema.                            Biopsied. In this region, a non-bleeding gastric                            ulcer with a clean ulcer base (  Forrest Class III).                            Biopsied. 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                           - A gastrojejunostomy was found, characterized by                            ulceration, an intact staple line and visible                            sutures. 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 Staples and sutures removed.                           - A second gastrojejunostomy was found,                            characterized by healthy appearing mucosa.                            Traversed.                           - Normal mucosa was found in the jejunum. Biopsied.                            Could not reach enteroenterostomy. Moderate Sedation:      Not Applicable - Patient had care per Anesthesia. Recommendation:           - The patient will be observed post-procedure,                            until all discharge criteria are met.                           - Return patient to hospital ward for ongoing care.                           - Soft diet for 3 days. Then may advance diet                            thereafter.                           - PO PPI BID 40 mg for 61-month.                           - Pathology to be followed up.                           -  Complete abstinence from alcohol and tobacco use.                           - Consider addition of Carafate if needed by                            patient based on symptomatology.                           - If patient wants to follow up in GI clinic, then                            would recommend a formal UGIS with SBFT to better                            define the fistula as well as her anatomy length as                            I was unable to find or reach an enteroenterostomy.                           - Recommend EGD for follow up/healing surveillance                            in 51-month on BID PPI.                           - The findings and recommendations were discussed                            with the patient. Procedure Code(s):        --- Professional ---                           [id number] Esophagogastroduodenoscopy, flexible,                            transoral; with removal of foreign body(s)                           43248, Esophagogastroduodenoscopy, flexible,                             transoral; with insertion of guide wire followed by                            passage of dilator(s) through esophagus over guide                            wire Diagnosis Code(s):        --- Professional ---                           K22.8, Other specified diseases of esophagus  K20.9, Esophagitis, unspecified                           Z98.0, Intestinal bypass and anastomosis status                           K25.9, Gastric ulcer, unspecified as acute or                            chronic, without hemorrhage or perforation                           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 All rights reserved. The codes documented in this report are preliminary and upon coder review may  be revised to meet current compliance requirements. Justice Britain, MD 02/08/2019 1:57:07 PM Number of Addenda: 0

## 2019-02-08 NOTE — Evaluation (Signed)
SLP Cancellation Note  Patient Details Name: Terri [name] MRN: [id number] DOB: [birth year]   Cancelled treatment:       Reason Eval/Treat Not Completed: Other (comment)(GI managing pt's dysphagia, note results of endoscopy, will sign off, please reorder if indicated)   Macario Golds 02/08/2019, 3:48 PM   Luanna Salk, [facility] SLP [place] Pager [phone] Office [phone]

## 2019-02-08 NOTE — Progress Notes (Signed)
Pt with c/o feeling short of breath and also with c/o pain in her chest. Telemetry NSR heart rate low 100s. BP 136/82 resp rate [id number] . Temperature 101.4. pt has been Medicated with two Tylenol. MD updated via phone. MD to place new orders

## 2019-02-09 ENCOUNTER — Inpatient Hospital Stay (HOSPITAL_COMMUNITY): Payer: Medicare Other

## 2019-02-09 ENCOUNTER — Encounter (HOSPITAL_COMMUNITY): Payer: Self-pay | Admitting: Gastroenterology

## 2019-02-09 ENCOUNTER — Other Ambulatory Visit: Payer: Self-pay | Admitting: Internal Medicine

## 2019-02-09 DIAGNOSIS — K257 Chronic gastric ulcer without hemorrhage or perforation: Secondary | ICD-10-CM

## 2019-02-09 DIAGNOSIS — N390 Urinary tract infection, site not specified: Secondary | ICD-10-CM

## 2019-02-09 DIAGNOSIS — Z9889 Other specified postprocedural states: Secondary | ICD-10-CM

## 2019-02-09 DIAGNOSIS — Z9884 Bariatric surgery status: Secondary | ICD-10-CM

## 2019-02-09 DIAGNOSIS — R7881 Bacteremia: Secondary | ICD-10-CM

## 2019-02-09 DIAGNOSIS — N12 Tubulo-interstitial nephritis, not specified as acute or chronic: Secondary | ICD-10-CM

## 2019-02-09 DIAGNOSIS — D509 Iron deficiency anemia, unspecified: Secondary | ICD-10-CM

## 2019-02-09 DIAGNOSIS — Z888 Allergy status to other drugs, medicaments and biological substances status: Secondary | ICD-10-CM

## 2019-02-09 DIAGNOSIS — R079 Chest pain, unspecified: Secondary | ICD-10-CM

## 2019-02-09 DIAGNOSIS — F329 Major depressive disorder, single episode, unspecified: Secondary | ICD-10-CM

## 2019-02-09 DIAGNOSIS — R8281 Pyuria: Secondary | ICD-10-CM

## 2019-02-09 DIAGNOSIS — B9620 Unspecified Escherichia coli [E. coli] as the cause of diseases classified elsewhere: Secondary | ICD-10-CM

## 2019-02-09 DIAGNOSIS — F17210 Nicotine dependence, cigarettes, uncomplicated: Secondary | ICD-10-CM

## 2019-02-09 DIAGNOSIS — R4702 Dysphasia: Secondary | ICD-10-CM

## 2019-02-09 DIAGNOSIS — I509 Heart failure, unspecified: Secondary | ICD-10-CM

## 2019-02-09 DIAGNOSIS — I809 Phlebitis and thrombophlebitis of unspecified site: Secondary | ICD-10-CM

## 2019-02-09 DIAGNOSIS — R509 Fever, unspecified: Secondary | ICD-10-CM | POA: Diagnosis present

## 2019-02-09 DIAGNOSIS — K259 Gastric ulcer, unspecified as acute or chronic, without hemorrhage or perforation: Secondary | ICD-10-CM | POA: Diagnosis present

## 2019-02-09 DIAGNOSIS — K279 Peptic ulcer, site unspecified, unspecified as acute or chronic, without hemorrhage or perforation: Secondary | ICD-10-CM | POA: Diagnosis present

## 2019-02-09 LAB — CBC WITH DIFFERENTIAL/PLATELET
Abs Immature Granulocytes: 0.16 10*3/uL — ABNORMAL HIGH (ref 0.00–0.07)
Basophils Absolute: 0.1 10*3/uL (ref 0.0–0.1)
Basophils Relative: 0 %
Eosinophils Absolute: 0.1 10*3/uL (ref 0.0–0.5)
Eosinophils Relative: 1 %
HCT: 20.2 % — ABNORMAL LOW (ref 36.0–46.0)
Hemoglobin: 6.7 g/dL — CL (ref 12.0–15.0)
Immature Granulocytes: 1 %
Lymphocytes Relative: 10 %
Lymphs Abs: 1.3 10*3/uL (ref 0.7–4.0)
MCH: 21.8 pg — ABNORMAL LOW (ref 26.0–34.0)
MCHC: 33.2 g/dL (ref 30.0–36.0)
MCV: 65.8 fL — ABNORMAL LOW (ref 80.0–100.0)
Monocytes Absolute: 0.9 10*3/uL (ref 0.1–1.0)
Monocytes Relative: 7 %
Neutro Abs: 11.5 10*3/uL — ABNORMAL HIGH (ref 1.7–7.7)
Neutrophils Relative %: 81 %
Platelets: 194 10*3/uL (ref 150–400)
RBC: 3.07 MIL/uL — ABNORMAL LOW (ref 3.87–5.11)
RDW: 18.3 % — ABNORMAL HIGH (ref 11.5–15.5)
WBC: 14.1 10*3/uL — ABNORMAL HIGH (ref 4.0–10.5)
nRBC: 0 % (ref 0.0–0.2)

## 2019-02-09 LAB — OCCULT BLOOD X 1 CARD TO LAB, STOOL: Fecal Occult Bld: NEGATIVE

## 2019-02-09 LAB — SURGICAL PATHOLOGY

## 2019-02-09 LAB — BASIC METABOLIC PANEL
Anion gap: 10 (ref 5–15)
BUN: 13 mg/dL (ref 6–20)
CO2: 29 mmol/L (ref 22–32)
Calcium: 7.6 mg/dL — ABNORMAL LOW (ref 8.9–10.3)
Chloride: 101 mmol/L (ref 98–111)
Creatinine, Ser: 0.88 mg/dL (ref 0.44–1.00)
GFR calc Af Amer: >60 mL/min (ref >60)
GFR calc non Af Amer: >60 mL/min (ref >60)
Glucose, Bld: 90 mg/dL (ref 70–99)
Potassium: 3.1 mmol/L — ABNORMAL LOW (ref 3.5–5.1)
Sodium: 140 mmol/L (ref 135–145)

## 2019-02-09 LAB — ECHOCARDIOGRAM COMPLETE
Height: 69 in
Weight: 2747.2 oz

## 2019-02-09 LAB — GLUCOSE, CAPILLARY
Glucose-Capillary: 107 mg/dL — ABNORMAL HIGH (ref 70–99)
Glucose-Capillary: 109 mg/dL — ABNORMAL HIGH (ref 70–99)
Glucose-Capillary: 92 mg/dL (ref 70–99)
Glucose-Capillary: 92 mg/dL (ref 70–99)
Glucose-Capillary: 95 mg/dL (ref 70–99)
Glucose-Capillary: 96 mg/dL (ref 70–99)

## 2019-02-09 LAB — HEMOGLOBIN AND HEMATOCRIT, BLOOD
HCT: 26.7 % — ABNORMAL LOW (ref 36.0–46.0)
Hemoglobin: 8.9 g/dL — ABNORMAL LOW (ref 12.0–15.0)

## 2019-02-09 LAB — PREPARE RBC (CROSSMATCH)

## 2019-02-09 LAB — MAGNESIUM: Magnesium: 1.8 mg/dL (ref 1.7–2.4)

## 2019-02-09 MED ORDER — POTASSIUM CHLORIDE CRYS ER 20 MEQ PO TBCR
40.0000 meq | EXTENDED_RELEASE_TABLET | Freq: Once | ORAL | Status: AC
  Administered 2019-02-09: 40 meq via ORAL
  Filled 2019-02-09: qty 2

## 2019-02-09 MED ORDER — MAGNESIUM SULFATE 4 GM/100ML IV SOLN
4.0000 g | Freq: Once | INTRAVENOUS | Status: AC
  Administered 2019-02-09: 4 g via INTRAVENOUS
  Filled 2019-02-09: qty 100

## 2019-02-09 MED ORDER — SODIUM CHLORIDE 0.9% IV SOLUTION: Freq: Once | INTRAVENOUS | Status: DC

## 2019-02-09 MED ORDER — DIPHENHYDRAMINE HCL 25 MG PO CAPS
25.0000 mg | ORAL_CAPSULE | Freq: Once | ORAL | Status: AC
  Administered 2019-02-09: 25 mg via ORAL
  Filled 2019-02-09: qty 1

## 2019-02-09 MED ORDER — FUROSEMIDE 10 MG/ML IJ SOLN
40.0000 mg | Freq: Once | INTRAMUSCULAR | Status: AC
  Administered 2019-02-09: 40 mg via INTRAVENOUS
  Filled 2019-02-09: qty 4

## 2019-02-09 MED ORDER — PERFLUTREN LIPID MICROSPHERE
1.0000 mL | INTRAVENOUS | Status: AC | PRN
  Administered 2019-02-09: 4 mL via INTRAVENOUS
  Filled 2019-02-09: qty 10

## 2019-02-09 MED ORDER — SODIUM CHLORIDE 0.9% IV SOLUTION
Freq: Once | INTRAVENOUS | Status: AC
  Administered 2019-02-09: 11:00:00 via INTRAVENOUS

## 2019-02-09 MED ORDER — SODIUM CHLORIDE 0.9% FLUSH: 10.0000 mL | INTRAVENOUS | Status: DC | PRN

## 2019-02-09 MED ORDER — ACETAMINOPHEN 325 MG PO TABS
650.0000 mg | ORAL_TABLET | Freq: Once | ORAL | Status: AC
  Administered 2019-02-09: 650 mg via ORAL
  Filled 2019-02-09: qty 2

## 2019-02-09 MED ORDER — POTASSIUM CHLORIDE CRYS ER 20 MEQ PO TBCR
40.0000 meq | EXTENDED_RELEASE_TABLET | Freq: Every day | ORAL | Status: DC
  Administered 2019-02-10 – 2019-02-11 (×2): 40 meq via ORAL
  Filled 2019-02-09 (×2): qty 2

## 2019-02-09 NOTE — Progress Notes (Signed)
  Echocardiogram 2D Echocardiogram has been performed.  [name] 02/09/2019, 9:57 AM

## 2019-02-09 NOTE — Progress Notes (Signed)
PROGRESS NOTE    Terri [name]  [id number] DOB: 05/04/1976 DOA: 02/02/2019 PCP: [name], MD    Brief Narrative:  43 y.o.femalewith medical history significant ofdepression, bipolar disorder, chronic back pain, hypertension, previous DVT, migraine headaches and previous pneumonia who presented to the ER with complaint of shortness of breath cough generalized weakness. Patient was seen and evaluated. She has had some chills headaches with vomiting and diarrhea over the last 4 days. She has been taking Tylenol and NyQuil but no response. Patient was found to be mildly hypoxic on arrival and placed on oxygen. She had elevated lactic acid and symptoms suggestive of sepsis. Chest x-ray was not revealing. Urinalysis suggested UTI as possible cause of her sepsis. Patient is being admitted to the hospital for work-up..  ED Course:Temperature is 99.9 blood pressure 81/52 pulse 115 respirate of 32 oxygen sat 91% on 2 L. White count is 12.6 hemoglobin 9.1 platelets 119. Potassium 3.4 creatinine 1.7 BUN 30 and calcium of 7.8. Lactic acid 2.8. Urinalysis showed cloudy urine with large leukocytes. Many bacteria WBC 21-50. Chest x-ray showed no active disease. CT abdomen pelvis showed bilateral renal enlargement with heterogeneous attenuation. Consistent with pyelonephritis. Patient is being admitted for treatment and early sepsis.   Assessment & Plan:   Principal Problem:   Persistent fever Active Problems:   Essential hypertension   Sepsis (HCC)   Leucocytosis   Thrombocytosis (HCC)   UTI (urinary tract infection)   Chest pain   Bacteremia due to Escherichia coli   Esophagitis   Hypokalemia   Hypomagnesemia   Covid-19 Virus not Detected   Gastric ulcer   S/P gastric bypass  1 sepsis secondary to E. coli bacteremia, E. coli pyelonephritis, POA Patient presented with some worsening shortness of breath, cough, generalized weakness, noted to be hypotensive with blood  pressure of 81/52 on admission with a leukocytosis, hypoxia, CT abdomen and pelvis consistent with pyelonephritis.  Patient admitted and pancultured.  Urinalysis with 30,000 colonies of E. coli which is pansensitive.  Blood cultures positive for E. coli.  Patient with ongoing fevers.  Patient with a worsening leukocytosis.  Repeat blood cultures done on 02/07/2019 with no growth to date.  CT abdomen and pelvis which was done was negative for perinephric abscess.  IV antibiotics have been changed from Ancef to IV Rocephin.  ID consulted for further evaluation and management as patient with ongoing fevers and had a worsening leukocytosis.  Will repeat UA with cultures and sensitivities.  Follow.  2.  Hypertension Blood pressure stable.  Patient was on IV fluids due to decreased oral intake and acute renal failure..  Blood pressure currently stable.  Continue lisinopril, Lasix.  Discontinue HCTZ.   3.  Chest pain/pulmonary edema/acute CHF exacerbation Patient with complaints of chest pain with deep inspiration.  Patient however initially refused troponins.  Patient however agreed and troponins were drawn which were flat and negative.  EKG ordered with normal sinus rhythm.  CXR done with diffuse bilateral interstitial pulmonary opacity most consistent with edema and small bilateral effusions.  2D echo ordered and pending.  Patient on Lasix 20 mg IV daily.  With a urine output of 4.1 L over the past 24 hours.  Continue diuresis with IV Lasix.  Continue home regimen lisinopril.  Will DC HCTZ.  Strict I's and O's.  Daily weights. Follow.  4.  Hypokalemia/hypomagnesemia Secondary to diuresis.  Magnesium at 1.8.  Potassium at 3.1.  K. Dur 40 mEq p.o. every 4 hours x2 doses.  5.  Iron deficiency anemia/??  Acute blood loss anemia Patient with no overt bleeding reported.  Patient with complaints of upper epigastric pain.  Hemoglobin currently at 6.7 from 8.3.  Transfusion threshold hemoglobin less than 7.  Patient  being followed by GI and patient status post upper endoscopy on 02/08/2019 which showed nummular lesions in the esophageal mucosa which were biopsied, LA grade a esophagitis.  Dilatation of entire esophagus.  Evidence of previous gastric surgery found characterized by inflammation and erythema which was biopsied.  Nonbleeding gastric ulcer noted with clean ulcer base which was biopsied.  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  Gastrojejunostomy found characterized by ulceration, intact staple line and visible sutures.  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  Staples and sutures removed.  Gastrojejunostomy found with healthy-appearing mucosa which was traversed.  Normal mucosa found in jejunum which was biopsied.  Could not reach enteroenterostomy.  Patient currently with fevers and as such we will hold off on giving IV iron.  Patient with a drop in hemoglobin at 6.7.  Transfuse 2 units packed red blood cells.  Follow H&H.  Will likely need IV iron during this hospitalization.  GI following and appreciate input and recommendations.   6.  Dysphagia/epigastric pain/nausea and vomiting/nonbleeding gastric ulcer per EGD 02/08/2019 Patient status post Roux-en-Y gastric bypass in 2012.  Continue Protonix 40 mg IV every 12 hours.  Patient being seen by GI and patient underwent upper endoscopy this morning 02/08/2019 which showed nummular lesions in the esophageal mucosa which were biopsied, LA grade a esophagitis.  Dilatation of entire esophagus.  Evidence of previous gastric surgery found characterized by inflammation and erythema which was biopsied.  Nonbleeding gastric ulcer noted with clean ulcer base which was biopsied.  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  Gastrojejunostomy found characterized by ulceration, intact staple line and visible sutures.  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  Staples and sutures removed.  Gastrojejunostomy found with healthy-appearing mucosa  which was traversed.  Normal mucosa found in jejunum which was biopsied.  Could not reach enteroenterostomy.  Per GI.    7.  Acute on chronic low back pain Patient noted to have intractable pain which was not improved with home Neurontin.  Likely were secondary to acute pyelonephritis.  Currently on Dilaudid as needed.  Continue empiric IV antibiotics.  8.  Depression/anxiety Stable.  Continue Elavil.  Outpatient follow-up.  9.  Acute renal failure Creatinine peaked at 2.03.  Felt secondary to dehydration as patient noted to be hypotensive on admission.  Improved with hydration.  IV fluids have been saline locked due to concerns for possible pulmonary edema.  Renal function improving daily.  Monitor closely with diuresis..  Follow.   DVT prophylaxis: SCDs Code Status: Full Family Communication: Updated patient.  No family at bedside. Disposition Plan: To be determined.  Likely home when clinically improved, afebrile x24 to 48 hours, improvement with leukocytosis.   Consultants:   Gastroenterology: Dr. Rush Landmark 02/05/2019  ID pending  Procedures:   CT abdomen and pelvis 02/02/2019, 02/08/2019  Chest x-ray 02/02/2019, 02/05/2019, 02/07/2019  Upper endoscopy per Dr. Rush Landmark 02/08/2019  2 units packed red blood cells pending 02/09/2019  2D echo 02/09/2019  Antimicrobials:   IV Ancef 02/05/2019>>>>>> 02/08/2019  IV cefepime 02/02/2019>>>> 02/03/2019  IV Rocephin  02/03/2019>>>>> 02/05/2019  IV Rocephin 02/08/2019   Subjective: Patient laying in bed.  Patient with ongoing complaints of chest pain however the state been chronic in  nature.  States some improvement with shortness of breath.  Still with complaints of upper abdominal/epigastric pain.  Patient denies any bleeding.  Objective: Vitals:   02/09/19 0916 02/09/19 1040 02/09/19 1040 02/09/19 1110  BP: (!) 150/97 115/80 115/80 125/81  Pulse: 92  82 90  Resp: 18  16 16   Temp: 99 F (37.2 C)  98.2 F (36.8 C) 98.4 F (36.9  C)  TempSrc:    Oral  SpO2: 95%  99% 94%  Weight:      Height:        Intake/Output Summary (Last 24 hours) at 02/09/2019 1243 Last data filed at 02/09/2019 1155 Gross per 24 hour  Intake -  Output 5300 ml  Net -5300 ml   Filed Weights   02/08/19 0413 02/08/19 1048 02/09/19 0533  Weight: 97.6 kg 97.6 kg 77.9 kg    Examination:  General exam: NAD Respiratory system: Bibasilar crackles.  Speaking in full sentences.  No use of accessory muscles of respiration.  Normal respiratory effort.  Cardiovascular system: RRR no murmurs rubs or gallops.  No JVD.  No lower extremity edema. Gastrointestinal system: Abdomen is soft, nondistended, some tenderness to palpation epigastric region, positive bowel sounds.  No rebound.  No guarding.   Central nervous system: Alert and oriented. No focal neurological deficits. Extremities: Symmetric 5 x 5 power. Skin: No rashes, lesions or ulcers Psychiatry: Judgement and insight appear normal. Mood & affect appropriate.     Data Reviewed: I have personally reviewed following labs and imaging studies  CBC: Recent Labs  Lab 02/02/19 1753  02/04/19 0831 02/05/19 0610 02/07/19 0523 02/08/19 0458 02/09/19 0451  WBC 12.6*   < > 14.9* 17.2* 16.7* 19.7* 14.1*  NEUTROABS 11.3*  --   --   --   --  16.4* 11.5*  HGB 9.0*   < > 8.4* 7.9* 7.3* 8.3* 6.7*  HCT 27.5*   < > 25.8* 24.1* 21.7* 25.6* 20.2*  MCV 67.4*   < > 67.9* 66.9* 65.0* 66.7* 65.8*  PLT 119*   < > 73* 72* 110* 148* 194   < > = values in this interval not displayed.   Basic Metabolic Panel: Recent Labs  Lab 02/03/19 0516 02/04/19 0831 02/05/19 0610 02/07/19 0523 02/08/19 0458 02/09/19 0451  NA 139 138 139 138 139 140  K 2.7* 3.5 3.5 2.5* 3.5 3.1*  CL 106 109 111 104 102 101  CO2 21* 22 21* 25 27 29   GLUCOSE 41* 80 70 82 86 90  BUN 34* 41* 36* 28* 21* 13  CREATININE 1.71* 2.03* 1.87* 1.35* 1.02* 0.88  CALCIUM 7.3* 7.7* 7.9* 8.0* 7.8* 7.6*  MG 1.7  --   --  1.5* 1.9 1.8    GFR: Estimated Creatinine Clearance: 84.4 mL/min (by C-G formula based on SCr of 0.88 mg/dL). Liver Function Tests: Recent Labs  Lab 02/02/19 1510 02/03/19 0516 02/05/19 0610 02/07/19 0523  AST 33 29 23 16   ALT 18 20 16 6   ALKPHOS 117 102 158* 147*  BILITOT 1.9* 1.8* 1.1 0.8  PROT 5.7* 5.0* 4.6* 4.5*  ALBUMIN 2.5* 2.1* 1.7* 1.6*   No results for input(s): LIPASE, AMYLASE in the last 168 hours. No results for input(s): AMMONIA in the last 168 hours. Coagulation Profile: Recent Labs  Lab 02/02/19 1753  INR 1.1   Cardiac Enzymes: No results for input(s): CKTOTAL, CKMB, CKMBINDEX, TROPONINI in the last 168 hours. BNP (last 3 results) No results for input(s): PROBNP in the last 8760 hours.  HbA1C: No results for input(s): HGBA1C in the last 72 hours. CBG: Recent Labs  Lab 02/08/19 2016 02/08/19 2352 02/09/19 0515 02/09/19 0728 02/09/19 1154  GLUCAP 113* 86 96 92 109*   Lipid Profile: No results for input(s): CHOL, HDL, LDLCALC, TRIG, CHOLHDL, LDLDIRECT in the last 72 hours. Thyroid Function Tests: No results for input(s): TSH, T4TOTAL, FREET4, T3FREE, THYROIDAB in the last 72 hours. Anemia Panel: No results for input(s): VITAMINB12, FOLATE, FERRITIN, TIBC, IRON, RETICCTPCT in the last 72 hours. Sepsis Labs: Recent Labs  Lab 02/02/19 1510 02/02/19 1753 02/02/19 1848  LATICACIDVEN 2.8* 1.7 1.6    Recent Results (from the past 240 hour(s))  SARS Coronavirus 2 [facility] order, Performed in [facility] hospital lab) Nasopharyngeal Nasopharyngeal Swab     Status: None   Collection Time: 02/02/19  3:10 PM   Specimen: Nasopharyngeal Swab  Result Value Ref Range Status   SARS Coronavirus 2 NEGATIVE NEGATIVE Final    Comment: (NOTE) If result is NEGATIVE SARS-CoV-2 target nucleic acids are NOT DETECTED. The SARS-CoV-2 RNA is generally detectable in upper and lower  respiratory specimens during the acute phase of infection. The lowest  concentration of SARS-CoV-2  viral copies this assay can detect is 250  copies / mL. A negative result does not preclude SARS-CoV-2 infection  and should not be used as the sole basis for treatment or other  patient management decisions.  A negative result may occur with  improper specimen collection / handling, submission of specimen other  than nasopharyngeal swab, presence of viral mutation(s) within the  areas targeted by this assay, and inadequate number of viral copies  (<250 copies / mL). A negative result must be combined with clinical  observations, patient history, and epidemiological information. If result is POSITIVE SARS-CoV-2 target nucleic acids are DETECTED. The SARS-CoV-2 RNA is generally detectable in upper and lower  respiratory specimens dur ing the acute phase of infection.  Positive  results are indicative of active infection with SARS-CoV-2.  Clinical  correlation with patient history and other diagnostic information is  necessary to determine patient infection status.  Positive results do  not rule out bacterial infection or co-infection with other viruses. If result is PRESUMPTIVE POSTIVE SARS-CoV-2 nucleic acids MAY BE PRESENT.   A presumptive positive result was obtained on the submitted specimen  and confirmed on repeat testing.  While 2019 novel coronavirus  (SARS-CoV-2) nucleic acids may be present in the submitted sample  additional confirmatory testing may be necessary for epidemiological  and / or clinical management purposes  to differentiate between  SARS-CoV-2 and other Sarbecovirus currently known to infect humans.  If clinically indicated additional testing with an alternate test  methodology [phone]) is advised. The SARS-CoV-2 RNA is generally  detectable in upper and lower respiratory sp ecimens during the acute  phase of infection. The expected result is Negative. Fact Sheet for Patients:  StrictlyIdeas.no Fact Sheet for Healthcare  Providers: BankingDealers.co.za This test is not yet approved or cleared by the [place] FDA and has been authorized for detection and/or diagnosis of SARS-CoV-2 by FDA under an Emergency Use Authorization (EUA).  This EUA will remain in effect (meaning this test can be used) for the duration of the COVID-19 declaration under Section 564(b)(1) of the Act, 21 U.S.C. section 360bbb-3(b)(1), unless the authorization is terminated or revoked sooner. Performed at [facility], [place] [address] [place], [place] [address]   Urine Culture     Status: Abnormal   Collection Time:  02/02/19  3:10 PM   Specimen: Urine, Clean Catch  Result Value Ref Range Status   Specimen Description   Final    URINE, CLEAN CATCH Performed at [facility], [place] [address], [place], [place] [address]    Special Requests   Final    NONE Performed at [facility], [place] [address] [place], [place] [address]    Culture 30,000 COLONIES/mL ESCHERICHIA COLI (A)  Final   Report Status 02/05/2019 FINAL  Final   Organism ID, Bacteria ESCHERICHIA COLI (A)  Final      Susceptibility   Escherichia coli - MIC*    AMPICILLIN <=2 SENSITIVE Sensitive     CEFAZOLIN <=4 SENSITIVE Sensitive     CEFTRIAXONE <=1 SENSITIVE Sensitive     CIPROFLOXACIN <=0.25 SENSITIVE Sensitive     GENTAMICIN <=1 SENSITIVE Sensitive     IMIPENEM <=0.25 SENSITIVE Sensitive     NITROFURANTOIN <=16 SENSITIVE Sensitive     TRIMETH/SULFA <=20 SENSITIVE Sensitive     AMPICILLIN/SULBACTAM <=2 SENSITIVE Sensitive     PIP/TAZO <=4 SENSITIVE Sensitive     Extended ESBL NEGATIVE Sensitive     * 30,000 COLONIES/mL ESCHERICHIA COLI  Blood Culture (routine x 2)     Status: Abnormal   Collection Time: 02/02/19  4:53 PM   Specimen: BLOOD LEFT FOREARM  Result Value Ref Range Status   Specimen Description   Final    BLOOD LEFT FOREARM Performed at [place] [address] [place], [place] [address]    Special Requests   Final    BOTTLES DRAWN AEROBIC AND ANAEROBIC Blood Culture adequate volume Performed at [place] [address], [place], [place] [address]    Culture  Setup Time   Final    GRAM NEGATIVE RODS IN BOTH AEROBIC AND ANAEROBIC BOTTLES CRITICAL RESULT CALLED TO, READ BACK BY AND VERIFIED WITH: PHARMD M LILLISTONE [id number] AT 1021 AM BY CM Performed at [facility], [place] [address] [place], [place] [address]    Culture ESCHERICHIA COLI (A)  Final   Report Status 02/05/2019 FINAL  Final   Organism ID, Bacteria ESCHERICHIA COLI  Final      Susceptibility   Escherichia coli - MIC*    AMPICILLIN <=2 SENSITIVE Sensitive     CEFAZOLIN <=4 SENSITIVE Sensitive     CEFEPIME <=1 SENSITIVE Sensitive     CEFTAZIDIME <=1 SENSITIVE Sensitive     CEFTRIAXONE <=1 SENSITIVE Sensitive     CIPROFLOXACIN <=0.25 SENSITIVE Sensitive     GENTAMICIN <=1 SENSITIVE Sensitive     IMIPENEM <=0.25 SENSITIVE Sensitive     TRIMETH/SULFA <=20 SENSITIVE Sensitive     AMPICILLIN/SULBACTAM <=2 SENSITIVE Sensitive     PIP/TAZO <=4 SENSITIVE Sensitive     Extended ESBL NEGATIVE Sensitive     * ESCHERICHIA COLI  Blood Culture ID Panel (Reflexed)     Status: Abnormal   Collection Time: 02/02/19  4:53 PM  Result Value Ref Range Status   Enterococcus species NOT DETECTED NOT DETECTED Final   Listeria monocytogenes NOT DETECTED NOT DETECTED Final   Staphylococcus species NOT DETECTED NOT DETECTED Final   Staphylococcus aureus (BCID) NOT DETECTED NOT DETECTED Final   Streptococcus species NOT DETECTED NOT DETECTED Final   Streptococcus agalactiae NOT DETECTED NOT DETECTED Final   Streptococcus pneumoniae NOT DETECTED NOT DETECTED Final   Streptococcus pyogenes NOT DETECTED NOT DETECTED Final   Acinetobacter baumannii NOT DETECTED NOT DETECTED Final  Enterobacteriaceae species DETECTED (A) NOT DETECTED Final    Comment:  Enterobacteriaceae represent a large family of gram-negative bacteria, not a single organism. CRITICAL RESULT CALLED TO, READ BACK BY AND VERIFIED WITH: PHARMD M LILLISTONE [id number] AT 1022 BY CM    Enterobacter cloacae complex NOT DETECTED NOT DETECTED Final   Escherichia coli DETECTED (A) NOT DETECTED Final    Comment: CRITICAL RESULT CALLED TO, READ BACK BY AND VERIFIED WITH: PHARMD M LILLISTONE [id number] AT 23 AM BY CM    Klebsiella oxytoca NOT DETECTED NOT DETECTED Final   Klebsiella pneumoniae NOT DETECTED NOT DETECTED Final   Proteus species NOT DETECTED NOT DETECTED Final   Serratia marcescens NOT DETECTED NOT DETECTED Final   Carbapenem resistance NOT DETECTED NOT DETECTED Final   Haemophilus influenzae NOT DETECTED NOT DETECTED Final   Neisseria meningitidis NOT DETECTED NOT DETECTED Final   Pseudomonas aeruginosa NOT DETECTED NOT DETECTED Final   Candida albicans NOT DETECTED NOT DETECTED Final   Candida glabrata NOT DETECTED NOT DETECTED Final   Candida krusei NOT DETECTED NOT DETECTED Final   Candida parapsilosis NOT DETECTED NOT DETECTED Final   Candida tropicalis NOT DETECTED NOT DETECTED Final    Comment: Performed at [facility], [place] [address] [place], [place] [address]  Blood Culture (routine x 2)     Status: Abnormal   Collection Time: 02/02/19  5:53 PM   Specimen: BLOOD  Result Value Ref Range Status   Specimen Description   Final    BLOOD RIGHT ANTECUBITAL Performed at [place] [address], [place], [place] [address]    Special Requests   Final    BOTTLES DRAWN AEROBIC AND ANAEROBIC Blood Culture results may not be optimal due to an excessive volume of blood received in culture bottles Performed at [place] [address], [place], [place] [address]    Culture  Setup Time   Final    GRAM NEGATIVE RODS IN BOTH AEROBIC AND ANAEROBIC BOTTLES CRITICAL RESULT CALLED TO, READ BACK BY AND VERIFIED WITH:  PHARMD M LILLISTONE [id number] AT 93 AM BY CM    Culture (A)  Final    ESCHERICHIA COLI SUSCEPTIBILITIES PERFORMED ON PREVIOUS CULTURE WITHIN THE LAST 5 DAYS. Performed at [facility], [place] [address], [place], [place] [address]    Report Status 02/05/2019 FINAL  Final  Culture, blood (Routine X 2) w Reflex to ID Panel     Status: None (Preliminary result)   Collection Time: 02/07/19 10:40 AM   Specimen: BLOOD  Result Value Ref Range Status   Specimen Description   Final    BLOOD RIGHT ARM Performed at [place] [address], [place], [place] [address]    Special Requests   Final    BOTTLES DRAWN AEROBIC ONLY Blood Culture adequate volume Performed at [place] [address] [place], [place] [address]    Culture   Final    NO GROWTH < 24 HOURS Performed at [place] [address], [place], [place] [address]    Report Status PENDING  Incomplete  Culture, blood (Routine X 2) w Reflex to ID Panel     Status: None (Preliminary result)   Collection Time: 02/07/19 10:45 AM   Specimen: BLOOD LEFT HAND  Result Value Ref Range Status   Specimen Description   Final    BLOOD LEFT HAND Performed at [place] [address]  [place], [place] [address]    Special Requests   Final    BOTTLES DRAWN AEROBIC ONLY Blood Culture results may not be optimal due to an inadequate volume of blood received in culture bottles Performed at [place] [address], [place], [place] [address]    Culture   Final    NO GROWTH < 24 HOURS Performed at [place] [address] [place], [place] [address]    Report Status PENDING  Incomplete         Radiology Studies: Dg Chest 2 View  Result Date: 02/07/2019 CLINICAL DATA:  Abdominal pain,, fever, urosepsis EXAM: CHEST - 2 VIEW COMPARISON:  02/05/2019 FINDINGS: No significant interval change in examination with generally bandlike  areas of atelectasis in the left lung, diffuse bilateral interstitial pulmonary opacity most consistent with edema, and small bilateral pleural effusions. There is no new focal airspace opacity. IMPRESSION: No significant interval change in examination with generally bandlike areas of atelectasis in the left lung, diffuse bilateral interstitial pulmonary opacity most consistent with edema, and small bilateral pleural effusions. There is no new focal airspace opacity. Electronically Signed   By: Eddie Candle M.D.   On: 02/07/2019 15:55   Ct Abdomen Pelvis W Contrast  Result Date: 02/08/2019 CLINICAL DATA:  Fever and worsening elevated white blood cell count EXAM: CT ABDOMEN AND PELVIS WITH CONTRAST TECHNIQUE: Multidetector CT imaging of the abdomen and pelvis was performed using the standard protocol following bolus administration of intravenous contrast. CONTRAST:  100 mL Omnipaque 300 COMPARISON:  02/02/2019, 04/02/2018, 09/13/2017 FINDINGS: Lower chest: Bilateral pleural effusions are noted right greater than left with associated bibasilar atelectatic changes. No focal confluent infiltrate is seen. No parenchymal nodules are noted. Hepatobiliary: No focal liver abnormality is seen. Status post cholecystectomy. No biliary dilatation. Pancreas: Unremarkable. No pancreatic ductal dilatation or surrounding inflammatory changes. Spleen: Normal in size without focal abnormality. Adrenals/Urinary Tract: Adrenal glands are within normal limits bilaterally. Kidneys demonstrate fullness of the collecting systems with irregular enhancement pattern similar to the density difference is noted on the prior exam consistent with pyelonephritis. Delayed images demonstrate mild excretion of contrast material. No definitive calculi are seen. The bladder is well distended. There is an enhancing nodule identified in the posterior aspect of the bladder just to the right of the midline similar to that seen on the prior exam from  2019. given its stability this is likely benign in etiology. Stomach/Bowel: Colon demonstrates scattered fecal material throughout. No obstructive changes are seen. The appendix is air-filled and within normal limits. No small bowel abnormality is seen. Postsurgical changes consistent with gastric bypass are noted. Vascular/Lymphatic: Aortic atherosclerosis. No enlarged abdominal or pelvic lymph nodes. Reproductive: Uterus and bilateral adnexa are unremarkable. Other: No abdominal wall hernia or abnormality. No abdominopelvic ascites. Musculoskeletal: Mild degenerative changes of lumbar spine are noted. IMPRESSION: Changes consistent with bilateral pyelonephritis better delineated on the current exam due to the administration of contrast material. Slight increase in fullness of the renal collecting systems is noted when compared with the prior study although no definitive obstructing lesions are seen. No abscess is identified. Bilateral pleural effusions right greater than left with associated atelectatic changes. Stable enhancing nodule along the posterior aspect of the bladder just to the right of the midline unchanged from 2019. Electronically Signed   By: Inez Catalina M.D.   On: 02/08/2019 22:41        Scheduled Meds: . sodium chloride   Intravenous Once  .  amitriptyline  75 mg Oral QHS  . docusate sodium  100 mg Oral Daily  . furosemide  20 mg Intravenous Daily  . furosemide  40 mg Intravenous Once  . gabapentin  300 mg Oral TID  . hydrochlorothiazide  12.5 mg Oral Daily  . insulin aspart  0-9 Units Subcutaneous Q4H  . lisinopril  10 mg Oral Daily  . magic mouthwash  10 mL Oral QID  . ondansetron (ZOFRAN) IV  4 mg Intravenous Q6H   Or  . ondansetron  4 mg Oral Q6H  . pantoprazole (PROTONIX) IV  40 mg Intravenous Q12H  . [START ON 02/10/2019] potassium chloride  40 mEq Oral Daily   Continuous Infusions: . sodium chloride 250 mL (02/07/19 0901)  . cefTRIAXone (ROCEPHIN)  IV 2 g (02/08/19  1902)     LOS: 7 days    Time spent: 40 minutes    Irine Seal, MD [facility]  If 7PM-7AM, please contact night-coverage [email or web address] 02/09/2019, 12:43 PM

## 2019-02-09 NOTE — Progress Notes (Addendum)
[facility] Progress Note  CC:  N/V, abdominal pain, drop in Hgb  Subjective:  Day #6  EGD on 9/17 showed the following:  - White nummular lesions in esophageal mucosa. Biopsied. LA Grade A esophagitis.  -Dilation performed in the entire esophagus. - Evidence of previous gastric surgery was found, characterized by inflammation and erythema. Biopsied. In this region, a non-bleeding gastric ulcer with a clean ulcer base (Forrest Class III). Biopsied. 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 - A gastrojejunostomy was found, characterized by ulceration, an intact staple line and visible sutures. Blind end suspected as it could not be traversed. Staples and sutures removed. - A second gastrojejunostomy was found, characterized by healthy appearing mucosa. Traversed. - Normal mucosa was found in the jejunum. Biopsied. Could not reach enteroenterostomy.  GI signed off after EGD yesterday but called back today for drop in Hgb from 8.3 grams to 6.7 grams overnight. 2 units PRBC's ordered.  Temp of 101.4 O/N.   Nurse says that she's had two brown bowel movements.  Stool negative for occult blood today.  Objective:  Vital signs in last 24 hours: Temp:  [98.2 F (36.8 C)-101.4 F (38.6 C)] 98.4 F (36.9 C) (09/18 1110) Pulse Rate:  [82-103] 90 (09/18 1110) Resp:  [16-30] 16 (09/18 1110) BP: (115-150)/(68-97) 125/81 (09/18 1110) SpO2:  [89 %-99 %] 94 % (09/18 1110) Weight:  [77.9 kg] 77.9 kg (09/18 0533) Last BM Date: 02/09/19 General:  Alert, Well-developed, in NAD Heart:  Regular rate and rhythm; no murmurs Pulm:  CTAB.  No increased WOB. Abdomen:  Soft, non-distended.  BS present.  Some left sided TTP. Extremities:  Without edema. Neurologic:  Alert and oriented x 4;  grossly normal neurologically. Psych:  Alert and cooperative. Normal mood and affect.  Intake/Output from previous day: 09/17 0701 - 09/18 0700 In: 700 [I.V.:700] Out: 4100  [Urine:4100] Intake/Output this shift: Total I/O In: -  Out: 1800 [Urine:1800]  Lab Results: Recent Labs    02/07/19 0523 02/08/19 0458 02/09/19 0451  WBC 16.7* 19.7* 14.1*  HGB 7.3* 8.3* 6.7*  HCT 21.7* 25.6* 20.2*  PLT 110* 148* 194   BMET Recent Labs    02/07/19 0523 02/08/19 0458 02/09/19 0451  NA 138 139 140  K 2.5* 3.5 3.1*  CL 104 102 101  CO2 25 27 29   GLUCOSE 82 86 90  BUN 28* 21* 13  CREATININE 1.35* 1.02* 0.88  CALCIUM 8.0* 7.8* 7.6*   LFT Recent Labs    02/07/19 0523  PROT 4.5*  ALBUMIN 1.6*  AST 16  ALT 6  ALKPHOS 147*  BILITOT 0.8   Dg Chest 2 View  Result Date: 02/07/2019 CLINICAL DATA:  Abdominal pain,, fever, urosepsis EXAM: CHEST - 2 VIEW COMPARISON:  02/05/2019 FINDINGS: No significant interval change in examination with generally bandlike areas of atelectasis in the left lung, diffuse bilateral interstitial pulmonary opacity most consistent with edema, and small bilateral pleural effusions. There is no new focal airspace opacity. IMPRESSION: No significant interval change in examination with generally bandlike areas of atelectasis in the left lung, diffuse bilateral interstitial pulmonary opacity most consistent with edema, and small bilateral pleural effusions. There is no new focal airspace opacity. Electronically Signed   By: Eddie Candle M.D.   On: 02/07/2019 15:55   Ct Abdomen Pelvis W Contrast  Result Date: 02/08/2019 CLINICAL DATA:  Fever and worsening elevated white blood cell count EXAM: CT ABDOMEN AND PELVIS WITH CONTRAST TECHNIQUE: Multidetector  CT imaging of the abdomen and pelvis was performed using the standard protocol following bolus administration of intravenous contrast. CONTRAST:  100 mL Omnipaque 300 COMPARISON:  02/02/2019, 04/02/2018, 09/13/2017 FINDINGS: Lower chest: Bilateral pleural effusions are noted right greater than left with associated bibasilar atelectatic changes. No focal confluent infiltrate is seen. No  parenchymal nodules are noted. Hepatobiliary: No focal liver abnormality is seen. Status post cholecystectomy. No biliary dilatation. Pancreas: Unremarkable. No pancreatic ductal dilatation or surrounding inflammatory changes. Spleen: Normal in size without focal abnormality. Adrenals/Urinary Tract: Adrenal glands are within normal limits bilaterally. Kidneys demonstrate fullness of the collecting systems with irregular enhancement pattern similar to the density difference is noted on the prior exam consistent with pyelonephritis. Delayed images demonstrate mild excretion of contrast material. No definitive calculi are seen. The bladder is well distended. There is an enhancing nodule identified in the posterior aspect of the bladder just to the right of the midline similar to that seen on the prior exam from 2019. given its stability this is likely benign in etiology. Stomach/Bowel: Colon demonstrates scattered fecal material throughout. No obstructive changes are seen. The appendix is air-filled and within normal limits. No small bowel abnormality is seen. Postsurgical changes consistent with gastric bypass are noted. Vascular/Lymphatic: Aortic atherosclerosis. No enlarged abdominal or pelvic lymph nodes. Reproductive: Uterus and bilateral adnexa are unremarkable. Other: No abdominal wall hernia or abnormality. No abdominopelvic ascites. Musculoskeletal: Mild degenerative changes of lumbar spine are noted. IMPRESSION: Changes consistent with bilateral pyelonephritis better delineated on the current exam due to the administration of contrast material. Slight increase in fullness of the renal collecting systems is noted when compared with the prior study although no definitive obstructing lesions are seen. No abscess is identified. Bilateral pleural effusions right greater than left with associated atelectatic changes. Stable enhancing nodule along the posterior aspect of the bladder just to the right of the midline  unchanged from 2019. Electronically Signed   By: Inez Catalina M.D.   On: 02/08/2019 22:41   Assessment / Plan: #61 43 year old white female admitted with urosepsis/pyelonephritis who has had recurrent fevers, and persistent leukocytosis.  Per primary service.  #2 nausea, vomiting, epigastric pain, dysphagia Patient is status post Roux-en-Y gastric bypass 2012  EGD 1 year ago with patent anastomosis, small gastric fistula mentioned  CT of the abdomen this admission showed a focally dilated loop of small bowel adjacent to the anastomosis of unclear clinical significance  Had EGD on 9/17 with clean based gastric ulcer, white lesions in the esophagus with esophagitis as stated above.  Biopsies pending.  #3  Anemia:  Chronically anemia and c/w AOCD.  Had drop in Hgb O/N from 8.3 grams to 6.7 grams.  She is hemoccult NEGATIVE.  2 units PRBC's ordered.  -Plans per Dr. Rush Landmark but I do not anticipate further need for GI evaluation in the setting of brown, heme negative stool. -Other plans per endoscopy report from 9/17.    LOS: 7 days   Laban Emperor. Zehr  02/09/2019, 1:18 PM

## 2019-02-09 NOTE — Consult Note (Signed)
[facility]    Date of Admission:  02/02/2019   Total days of antibiotics 8        Day 2 ceftriaxone         Reason for Consult: Persistent fever on therapy for bacteremic E. coli UTI   Referring Provider: Dr. Irine Seal  Assessment: It appeared that Ms. Murrey was improving on initial therapy before spiking more fever yesterday after her upper endoscopy.  The fever may be due to her bacteremic E. coli UTI, her esophageal dilatation and/or left forearm thrombophlebitis.  Repeat blood cultures are negative at 48 hours.  I recommend continuing current antibiotic therapy pending further observation and final culture results.  Plan: 1. Continue ceftriaxone 2. Await final cultures  Principal Problem:   Persistent fever Active Problems:   UTI (urinary tract infection)   Bacteremia due to Escherichia coli   Esophagitis   Gastric ulcer   S/P gastric bypass   Essential hypertension   Sepsis (HCC)   Leucocytosis   Thrombocytosis (HCC)   Chest pain   Hypokalemia   Hypomagnesemia   Covid-19 Virus not Detected   Scheduled Meds: . sodium chloride   Intravenous Once  . sodium chloride   Intravenous Once  . amitriptyline  75 mg Oral QHS  . docusate sodium  100 mg Oral Daily  . furosemide  20 mg Intravenous Daily  . furosemide  40 mg Intravenous Once  . gabapentin  300 mg Oral TID  . hydrochlorothiazide  12.5 mg Oral Daily  . insulin aspart  0-9 Units Subcutaneous Q4H  . lisinopril  10 mg Oral Daily  . magic mouthwash  10 mL Oral QID  . ondansetron (ZOFRAN) IV  4 mg Intravenous Q6H   Or  . ondansetron  4 mg Oral Q6H  . pantoprazole (PROTONIX) IV  40 mg Intravenous Q12H  . [START ON 02/10/2019] potassium chloride  40 mEq Oral Daily   Continuous Infusions: . sodium chloride 250 mL (02/07/19 0901)  . cefTRIAXone (ROCEPHIN)  IV 2 g (02/08/19 1902)   PRN Meds:.sodium chloride, acetaminophen, albuterol, HYDROmorphone (DILAUDID) injection,  phenol, sodium chloride flush  HPI: Terri [name] is a 43 y.o. female who was admitted on 02/02/2019 with fever, chills, dysuria, shortness of breath, weakness, headache, nausea and vomiting.  CT scan showed evidence of bilateral pyelonephritis.  Urinalysis showed pyuria.  Urine and blood cultures have grown a pansensitive E. coli.  She defervesced on initial therapy.  Yesterday she underwent upper endoscopy.  She has a history of gastric bypass 8 years ago and recent dysphasia.  She had evidence of esophagitis and a gastric ulcer.  She underwent esophageal dilatation.  She had further fever to 101.4 degrees yesterday.  Overall she is feeling much better than she was upon admission.   Review of Systems: Review of Systems  Constitutional: Positive for chills, fever and malaise/fatigue. Negative for diaphoresis.  HENT: Negative for congestion and sore throat.   Respiratory: Negative for cough, sputum production and shortness of breath.   Cardiovascular: Positive for chest pain.  Gastrointestinal: Negative for abdominal pain, diarrhea, nausea and vomiting.  Genitourinary: Positive for dysuria.  Skin: Negative for rash.  Psychiatric/Behavioral: Positive for depression.    Past Medical History:  Diagnosis Date  . Arthritis    Back and legs  . Bipolar disorder ([place])   . Chronic back pain   . Depression   . DVT (deep venous thrombosis) ([place])  early 20's leg  . History of blood transfusion   . Hypertension   . Migraine headache   . Neuropathy   . Pneumonia   . 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 Sciatica   . Stress incontinence     Social History   Tobacco Use  . Smoking status: Current Every Day Smoker    Packs/day: 1.00    Years: 29.00    Pack years: 29.00    Types: Cigarettes  . Smokeless tobacco: Never Used  Substance Use Topics  . Alcohol use: No  . 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 Diabetes Mother   . Hypertension Mother   . Diabetes Father   . Hypertension Father    Allergies  Allergen Reactions  . Ketorolac Tromethamine Itching and Nausea And Vomiting  . Methocarbamol Diarrhea    OBJECTIVE: Blood pressure 125/81, pulse 90, temperature 98.4 F (36.9 C), temperature source Oral, resp. rate 16, height 5\' 9"  (1.753 m), weight 77.9 kg, last menstrual period 09/22/2018, SpO2 94 %.  Physical Exam Constitutional:      Comments: She is sitting up in bed talking with her nurses.  She is in good spirits.  HENT:     Mouth/Throat:     Pharynx: No oropharyngeal exudate.  Eyes:     Conjunctiva/sclera: Conjunctivae normal.  Cardiovascular:     Rate and Rhythm: Normal rate and regular rhythm.     Heart sounds: No murmur.  Pulmonary:     Effort: Pulmonary effort is normal.     Breath sounds: Normal breath sounds.  Abdominal:     Palpations: Abdomen is soft.     Tenderness: There is no abdominal tenderness.  Skin:    Findings: No rash.     Comments: She has a palpable cord in her left forearm at a previous IV site.  The area is not red or tender.  There is no fluctuance.  Psychiatric:        Mood and Affect: Mood normal.     Lab Results Lab Results  Component Value Date   WBC 14.1 (H) 02/09/2019   HGB 6.7 (LL) 02/09/2019   HCT 20.2 (L) 02/09/2019   MCV 65.8 (L) 02/09/2019   PLT 194 02/09/2019    Lab Results  Component Value Date   CREATININE 0.88 02/09/2019   BUN 13 02/09/2019   NA 140 02/09/2019   K 3.1 (L) 02/09/2019   CL 101 02/09/2019   CO2 29 02/09/2019    Lab Results  Component Value Date   ALT 6 02/07/2019   AST 16 02/07/2019   ALKPHOS 147 (H) 02/07/2019   BILITOT 0.8 02/07/2019     Microbiology: Recent Results (from the past 240 hour(s))  SARS Coronavirus 2 [facility] order, Performed in [facility] hospital lab) Nasopharyngeal Nasopharyngeal Swab     Status: None   Collection Time: 02/02/19  3:10 PM   Specimen:  Nasopharyngeal Swab  Result Value Ref Range Status   SARS Coronavirus 2 NEGATIVE NEGATIVE Final    Comment: (NOTE) If result is NEGATIVE SARS-CoV-2 target nucleic acids are NOT DETECTED. The SARS-CoV-2 RNA is generally detectable in upper and lower  respiratory specimens during the acute phase of infection. The lowest  concentration of SARS-CoV-2 viral copies this assay can detect is 250  copies / mL. A negative result  does not preclude SARS-CoV-2 infection  and should not be used as the sole basis for treatment or other  patient management decisions.  A negative result may occur with  improper specimen collection / handling, submission of specimen other  than nasopharyngeal swab, presence of viral mutation(s) within the  areas targeted by this assay, and inadequate number of viral copies  (<250 copies / mL). A negative result must be combined with clinical  observations, patient history, and epidemiological information. If result is POSITIVE SARS-CoV-2 target nucleic acids are DETECTED. The SARS-CoV-2 RNA is generally detectable in upper and lower  respiratory specimens dur ing the acute phase of infection.  Positive  results are indicative of active infection with SARS-CoV-2.  Clinical  correlation with patient history and other diagnostic information is  necessary to determine patient infection status.  Positive results do  not rule out bacterial infection or co-infection with other viruses. If result is PRESUMPTIVE POSTIVE SARS-CoV-2 nucleic acids MAY BE PRESENT.   A presumptive positive result was obtained on the submitted specimen  and confirmed on repeat testing.  While 2019 novel coronavirus  (SARS-CoV-2) nucleic acids may be present in the submitted sample  additional confirmatory testing may be necessary for epidemiological  and / or clinical management purposes  to differentiate between  SARS-CoV-2 and other Sarbecovirus currently known to infect humans.  If clinically  indicated additional testing with an alternate test  methodology [phone]) is advised. The SARS-CoV-2 RNA is generally  detectable in upper and lower respiratory sp ecimens during the acute  phase of infection. The expected result is Negative. Fact Sheet for Patients:  StrictlyIdeas.no Fact Sheet for Healthcare Providers: BankingDealers.co.za This test is not yet approved or cleared by the [place] FDA and has been authorized for detection and/or diagnosis of SARS-CoV-2 by FDA under an Emergency Use Authorization (EUA).  This EUA will remain in effect (meaning this test can be used) for the duration of the COVID-19 declaration under Section 564(b)(1) of the Act, 21 U.S.C. section 360bbb-3(b)(1), unless the authorization is terminated or revoked sooner. Performed at [facility], [place] [address], [place], [place] [address]   Urine Culture     Status: Abnormal   Collection Time: 02/02/19  3:10 PM   Specimen: Urine, Clean Catch  Result Value Ref Range Status   Specimen Description   Final    URINE, CLEAN CATCH Performed at [facility], [place] [address] [place], [place] [address]    Special Requests   Final    NONE Performed at [facility], [place] [address], [place], [place] [address]    Culture 30,000 COLONIES/mL ESCHERICHIA COLI (A)  Final   Report Status 02/05/2019 FINAL  Final   Organism ID, Bacteria ESCHERICHIA COLI (A)  Final      Susceptibility   Escherichia coli - MIC*    AMPICILLIN <=2 SENSITIVE Sensitive     CEFAZOLIN <=4 SENSITIVE Sensitive     CEFTRIAXONE <=1 SENSITIVE Sensitive     CIPROFLOXACIN <=0.25 SENSITIVE Sensitive     GENTAMICIN <=1 SENSITIVE Sensitive     IMIPENEM <=0.25 SENSITIVE Sensitive     NITROFURANTOIN <=16 SENSITIVE Sensitive     TRIMETH/SULFA <=20 SENSITIVE Sensitive     AMPICILLIN/SULBACTAM <=2 SENSITIVE Sensitive     PIP/TAZO <=4 SENSITIVE  Sensitive     Extended ESBL NEGATIVE Sensitive     * 30,000 COLONIES/mL ESCHERICHIA COLI  Blood Culture (routine x 2)     Status: Abnormal  Collection Time: 02/02/19  4:53 PM   Specimen: BLOOD LEFT FOREARM  Result Value Ref Range Status   Specimen Description   Final    BLOOD LEFT FOREARM Performed at [facility] [address] [place], [place] [address]    Special Requests   Final    BOTTLES DRAWN AEROBIC AND ANAEROBIC Blood Culture adequate volume Performed at [facility] [address], [place], [place] [address]    Culture  Setup Time   Final    GRAM NEGATIVE RODS IN BOTH AEROBIC AND ANAEROBIC BOTTLES CRITICAL RESULT CALLED TO, READ BACK BY AND VERIFIED WITH: PHARMD M LILLISTONE [id number] AT 1021 AM BY CM Performed at [facility], [place] [address], [place], [place] [address]    Culture ESCHERICHIA COLI (A)  Final   Report Status 02/05/2019 FINAL  Final   Organism ID, Bacteria ESCHERICHIA COLI  Final      Susceptibility   Escherichia coli - MIC*    AMPICILLIN <=2 SENSITIVE Sensitive     CEFAZOLIN <=4 SENSITIVE Sensitive     CEFEPIME <=1 SENSITIVE Sensitive     CEFTAZIDIME <=1 SENSITIVE Sensitive     CEFTRIAXONE <=1 SENSITIVE Sensitive     CIPROFLOXACIN <=0.25 SENSITIVE Sensitive     GENTAMICIN <=1 SENSITIVE Sensitive     IMIPENEM <=0.25 SENSITIVE Sensitive     TRIMETH/SULFA <=20 SENSITIVE Sensitive     AMPICILLIN/SULBACTAM <=2 SENSITIVE Sensitive     PIP/TAZO <=4 SENSITIVE Sensitive     Extended ESBL NEGATIVE Sensitive     * ESCHERICHIA COLI  Blood Culture ID Panel (Reflexed)     Status: Abnormal   Collection Time: 02/02/19  4:53 PM  Result Value Ref Range Status   Enterococcus species NOT DETECTED NOT DETECTED Final   Listeria monocytogenes NOT DETECTED NOT DETECTED Final   Staphylococcus species NOT DETECTED NOT DETECTED Final   Staphylococcus aureus (BCID) NOT DETECTED NOT DETECTED Final   Streptococcus species NOT  DETECTED NOT DETECTED Final   Streptococcus agalactiae NOT DETECTED NOT DETECTED Final   Streptococcus pneumoniae NOT DETECTED NOT DETECTED Final   Streptococcus pyogenes NOT DETECTED NOT DETECTED Final   Acinetobacter baumannii NOT DETECTED NOT DETECTED Final   Enterobacteriaceae species DETECTED (A) NOT DETECTED Final    Comment: Enterobacteriaceae represent a large family of gram-negative bacteria, not a single organism. CRITICAL RESULT CALLED TO, READ BACK BY AND VERIFIED WITH: PHARMD M LILLISTONE [id number] AT 1022 BY CM    Enterobacter cloacae complex NOT DETECTED NOT DETECTED Final   Escherichia coli DETECTED (A) NOT DETECTED Final    Comment: CRITICAL RESULT CALLED TO, READ BACK BY AND VERIFIED WITH: PHARMD M LILLISTONE [id number] AT 81 AM BY CM    Klebsiella oxytoca NOT DETECTED NOT DETECTED Final   Klebsiella pneumoniae NOT DETECTED NOT DETECTED Final   Proteus species NOT DETECTED NOT DETECTED Final   Serratia marcescens NOT DETECTED NOT DETECTED Final   Carbapenem resistance NOT DETECTED NOT DETECTED Final   Haemophilus influenzae NOT DETECTED NOT DETECTED Final   Neisseria meningitidis NOT DETECTED NOT DETECTED Final   Pseudomonas aeruginosa NOT DETECTED NOT DETECTED Final   Candida albicans NOT DETECTED NOT DETECTED Final   Candida glabrata NOT DETECTED NOT DETECTED Final   Candida krusei NOT DETECTED NOT DETECTED Final   Candida parapsilosis NOT DETECTED NOT DETECTED Final   Candida tropicalis NOT DETECTED NOT DETECTED Final    Comment: Performed at [facility], [place] [address] [place], [place]  [id number]  Blood Culture (routine x 2)     Status: Abnormal   Collection Time: 02/02/19  5:53 PM   Specimen: BLOOD  Result Value Ref Range Status   Specimen Description   Final    BLOOD RIGHT ANTECUBITAL Performed at [facility] [address], [place], [place] [address]    Special Requests   Final    BOTTLES DRAWN AEROBIC AND ANAEROBIC Blood Culture  results may not be optimal due to an excessive volume of blood received in culture bottles Performed at [facility] [address] [place], [place] [address]    Culture  Setup Time   Final    GRAM NEGATIVE RODS IN BOTH AEROBIC AND ANAEROBIC BOTTLES CRITICAL RESULT CALLED TO, READ BACK BY AND VERIFIED WITH: PHARMD M LILLISTONE [id number] AT 93 AM BY CM    Culture (A)  Final    ESCHERICHIA COLI SUSCEPTIBILITIES PERFORMED ON PREVIOUS CULTURE WITHIN THE LAST 5 DAYS. Performed at [facility], [place] [address], [place], [place] [address]    Report Status 02/05/2019 FINAL  Final  Culture, blood (Routine X 2) w Reflex to ID Panel     Status: None (Preliminary result)   Collection Time: 02/07/19 10:40 AM   Specimen: BLOOD  Result Value Ref Range Status   Specimen Description   Final    BLOOD RIGHT ARM Performed at [facility] [address] [place], [place] [address]    Special Requests   Final    BOTTLES DRAWN AEROBIC ONLY Blood Culture adequate volume Performed at [facility] [address], [place], [place] [address]    Culture   Final    NO GROWTH < 24 HOURS Performed at [facility] [address] [place], [place] [address]    Report Status PENDING  Incomplete  Culture, blood (Routine X 2) w Reflex to ID Panel     Status: None (Preliminary result)   Collection Time: 02/07/19 10:45 AM   Specimen: BLOOD LEFT HAND  Result Value Ref Range Status   Specimen Description   Final    BLOOD LEFT HAND Performed at [facility] [address], [place], [place] [address]    Special Requests   Final    BOTTLES DRAWN AEROBIC ONLY Blood Culture results may not be optimal due to an inadequate volume of blood received in culture bottles Performed at [facility] [address], [place], [place] [address]    Culture   Final    NO GROWTH < 24 HOURS Performed at [facility] [address] [place],  [address]    Report Status PENDING  Incomplete    Michel Bickers, MD [facility] [phone] pager   [phone] cell 02/09/2019, 11:44 AM

## 2019-02-10 DIAGNOSIS — K209 Esophagitis, unspecified: Secondary | ICD-10-CM

## 2019-02-10 DIAGNOSIS — D649 Anemia, unspecified: Secondary | ICD-10-CM

## 2019-02-10 DIAGNOSIS — K259 Gastric ulcer, unspecified as acute or chronic, without hemorrhage or perforation: Secondary | ICD-10-CM

## 2019-02-10 DIAGNOSIS — K2970 Gastritis, unspecified, without bleeding: Secondary | ICD-10-CM

## 2019-02-10 LAB — CBC WITH DIFFERENTIAL/PLATELET
Abs Immature Granulocytes: 0.14 10*3/uL — ABNORMAL HIGH (ref 0.00–0.07)
Basophils Absolute: 0.1 10*3/uL (ref 0.0–0.1)
Basophils Relative: 1 %
Eosinophils Absolute: 0.2 10*3/uL (ref 0.0–0.5)
Eosinophils Relative: 1 %
HCT: 27.3 % — ABNORMAL LOW (ref 36.0–46.0)
Hemoglobin: 9 g/dL — ABNORMAL LOW (ref 12.0–15.0)
Immature Granulocytes: 1 %
Lymphocytes Relative: 11 %
Lymphs Abs: 1.7 10*3/uL (ref 0.7–4.0)
MCH: 23.3 pg — ABNORMAL LOW (ref 26.0–34.0)
MCHC: 33 g/dL (ref 30.0–36.0)
MCV: 70.7 fL — ABNORMAL LOW (ref 80.0–100.0)
Monocytes Absolute: 0.9 10*3/uL (ref 0.1–1.0)
Monocytes Relative: 6 %
Neutro Abs: 12.5 10*3/uL — ABNORMAL HIGH (ref 1.7–7.7)
Neutrophils Relative %: 80 %
Platelets: 252 10*3/uL (ref 150–400)
RBC: 3.86 MIL/uL — ABNORMAL LOW (ref 3.87–5.11)
RDW: 22.1 % — ABNORMAL HIGH (ref 11.5–15.5)
WBC: 15.4 10*3/uL — ABNORMAL HIGH (ref 4.0–10.5)
nRBC: 0 % (ref 0.0–0.2)

## 2019-02-10 LAB — BPAM RBC
Blood Product Expiration Date: 202010082359
Blood Product Expiration Date: 202010112359
ISSUE DATE / TIME: 202009181044
ISSUE DATE / TIME: 202009181428
Unit Type and Rh: 7300
Unit Type and Rh: 7300

## 2019-02-10 LAB — TYPE AND SCREEN
ABO/RH(D): B POS
Antibody Screen: NEGATIVE
Unit division: 0
Unit division: 0

## 2019-02-10 LAB — URINALYSIS, ROUTINE W REFLEX MICROSCOPIC
Bilirubin Urine: NEGATIVE
Glucose, UA: NEGATIVE mg/dL
Ketones, ur: NEGATIVE mg/dL
Nitrite: NEGATIVE
Protein, ur: NEGATIVE mg/dL
Specific Gravity, Urine: 1.008 (ref 1.005–1.030)
pH: 8 (ref 5.0–8.0)

## 2019-02-10 LAB — BASIC METABOLIC PANEL
Anion gap: 10 (ref 5–15)
BUN: 10 mg/dL (ref 6–20)
CO2: 31 mmol/L (ref 22–32)
Calcium: 7.8 mg/dL — ABNORMAL LOW (ref 8.9–10.3)
Chloride: 96 mmol/L — ABNORMAL LOW (ref 98–111)
Creatinine, Ser: 0.74 mg/dL (ref 0.44–1.00)
GFR calc Af Amer: >60 mL/min (ref >60)
GFR calc non Af Amer: >60 mL/min (ref >60)
Glucose, Bld: 101 mg/dL — ABNORMAL HIGH (ref 70–99)
Potassium: 3.7 mmol/L (ref 3.5–5.1)
Sodium: 137 mmol/L (ref 135–145)

## 2019-02-10 LAB — GLUCOSE, CAPILLARY
Glucose-Capillary: 105 mg/dL — ABNORMAL HIGH (ref 70–99)
Glucose-Capillary: 109 mg/dL — ABNORMAL HIGH (ref 70–99)
Glucose-Capillary: 110 mg/dL — ABNORMAL HIGH (ref 70–99)
Glucose-Capillary: 119 mg/dL — ABNORMAL HIGH (ref 70–99)
Glucose-Capillary: 82 mg/dL (ref 70–99)
Glucose-Capillary: 92 mg/dL (ref 70–99)

## 2019-02-10 LAB — MAGNESIUM: Magnesium: 2.2 mg/dL (ref 1.7–2.4)

## 2019-02-10 NOTE — Progress Notes (Signed)
SATURATION QUALIFICATIONS: (This note is used to comply with regulatory documentation for home oxygen)  Patient Saturations on Room Air at Rest = 98%  Patient Saturations on Room Air while Ambulating = 95%  Patient Saturations on 0 Liters of oxygen while Ambulating = 95%  Please briefly explain why patient needs home oxygen: 

## 2019-02-10 NOTE — Progress Notes (Signed)
PROGRESS NOTE    Terri [name]  [id number] DOB: 26-Dec-1975 DOA: 02/02/2019 PCP: [name], MD    Brief Narrative:  43 y.o.femalewith medical history significant ofdepression, bipolar disorder, chronic back pain, hypertension, previous DVT, migraine headaches and previous pneumonia who presented to the ER with complaint of shortness of breath cough generalized weakness. Patient was seen and evaluated. She has had some chills headaches with vomiting and diarrhea over the last 4 days. She has been taking Tylenol and NyQuil but no response. Patient was found to be mildly hypoxic on arrival and placed on oxygen. She had elevated lactic acid and symptoms suggestive of sepsis. Chest x-ray was not revealing. Urinalysis suggested UTI as possible cause of her sepsis. Patient is being admitted to the hospital for work-up..  ED Course:Temperature is 99.9 blood pressure 81/52 pulse 115 respirate of 32 oxygen sat 91% on 2 L. White count is 12.6 hemoglobin 9.1 platelets 119. Potassium 3.4 creatinine 1.7 BUN 30 and calcium of 7.8. Lactic acid 2.8. Urinalysis showed cloudy urine with large leukocytes. Many bacteria WBC 21-50. Chest x-ray showed no active disease. CT abdomen pelvis showed bilateral renal enlargement with heterogeneous attenuation. Consistent with pyelonephritis. Patient is being admitted for treatment and early sepsis.   Assessment & Plan:   Principal Problem:   Persistent fever Active Problems:   Essential hypertension   Sepsis (HCC)   Leucocytosis   Thrombocytosis (HCC)   UTI (urinary tract infection)   Chest pain   Bacteremia due to Escherichia coli   Esophagitis   Hypokalemia   Hypomagnesemia   Covid-19 Virus not Detected   Gastric ulcer   S/P gastric bypass  1 sepsis secondary to E. coli bacteremia, E. coli pyelonephritis, POA Patient presented with some worsening shortness of breath, cough, generalized weakness, noted to be hypotensive with blood  pressure of 81/52 on admission with a leukocytosis, hypoxia, CT abdomen and pelvis consistent with pyelonephritis.  Patient admitted and pancultured.  Urinalysis with 30,000 colonies of E. coli which is pansensitive.  Blood cultures positive for E. coli.  Patient with fever curve trending down.  Leukocytosis fluctuating.  Repeat blood cultures done on 02/07/2019 with no growth to date.  CT abdomen and pelvis which was done was negative for perinephric abscess.  IV antibiotics have been changed from Ancef to IV Rocephin.  ID consulted for further evaluation and management as patient with ongoing fevers and had a worsening leukocytosis. Follow.  2.  Hypertension Blood pressure stable.  Patient was on IV fluids due to decreased oral intake and acute renal failure..  Blood pressure currently stable.  Continue lisinopril, Lasix.  Discontinued HCTZ.   3.  Chest pain/pulmonary edema/acute CHF exacerbation Patient with complaints of chest pain with deep inspiration.  Patient however initially refused troponins.  Patient however agreed and troponins were drawn which were flat and negative.  EKG ordered with normal sinus rhythm.  CXR done with diffuse bilateral interstitial pulmonary opacity most consistent with edema and small bilateral effusions.  2D echo ordered and pending.  Patient on Lasix 20 mg IV daily.  With a urine output of 4.6 L over the past 24 hours.  Continue diuresis with IV Lasix.  Could likely transition to oral Lasix in the next 1 to 2 days.  Continue home regimen lisinopril.  Will DC HCTZ.  Strict I's and O's.  Daily weights. Follow.  4.  Hypokalemia/hypomagnesemia Secondary to diuresis.  Magnesium at 2.2.  Potassium at 3.7.  Monitor with diuresis.  5.  Iron  deficiency anemia/??  Acute blood loss anemia Patient with no overt bleeding reported.  Patient with complaints of upper epigastric pain.  Hemoglobin currently at 6.7 from 8.3.  Transfusion threshold hemoglobin less than 7.  Patient being  followed by GI and patient status post upper endoscopy on 02/08/2019 which showed nummular lesions in the esophageal mucosa which were biopsied, LA grade a esophagitis.  Dilatation of entire esophagus.  Evidence of previous gastric surgery found characterized by inflammation and erythema which was biopsied.  Nonbleeding gastric ulcer noted with clean ulcer base which was biopsied.  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  Gastrojejunostomy found characterized by ulceration, intact staple line and visible sutures.  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  Staples and sutures removed.  Gastrojejunostomy found with healthy-appearing mucosa which was traversed.  Normal mucosa found in jejunum which was biopsied.  Could not reach enteroenterostomy.  Patient currently with acute infection and as such we will hold off on giving IV iron. Patient with a drop in hemoglobin at 6.7 on 02/09/2019.  Patient status post transfusion of 2 units packed red blood cells on 02/09/2019 hemoglobin currently at 9.0.  Follow H&H.  Transfusion threshold hemoglobin less than 7.Will likely need IV iron. GI following and appreciate input and recommendations.   6.  Dysphagia/epigastric pain/nausea and vomiting/nonbleeding gastric ulcer, gastritis, esophagitis per EGD 02/08/2019 Patient status post Roux-en-Y gastric bypass in 2012.  Continue Protonix 40 mg IV every 12 hours.  Patient being seen by GI and patient underwent upper endoscopy this morning 02/08/2019 which showed nummular lesions in the esophageal mucosa which were biopsied, LA grade a esophagitis.  Dilatation of entire esophagus.  Evidence of previous gastric surgery found characterized by inflammation and erythema which was biopsied.  Nonbleeding gastric ulcer noted with clean ulcer base which was biopsied.  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  Gastrojejunostomy found characterized by ulceration, intact staple line and visible sutures.  Blind and  suspected as it could not be traversed.  Staples and sutures removed.  Gastrojejunostomy found with healthy-appearing mucosa which was traversed.  Normal mucosa found in jejunum which was biopsied.  Could not reach enteroenterostomy.  Per GI.    7.  Acute on chronic low back pain Patient noted to have intractable pain which was not improved with home Neurontin.  Likely were secondary to acute pyelonephritis.  Pain improving.  Continue current pain regimen.  Continue current empiric IV antibiotics.  Follow.    8.  Depression/anxiety Stable.  Continue Elavil.  Outpatient follow-up.    9.  Acute renal failure Creatinine peaked at 2.03.  Felt secondary to dehydration as patient noted to be hypotensive on admission.  Improved with hydration.  IV fluids have been saline locked due to concerns for possible pulmonary edema.  Renal function improving daily.  Monitor closely with diuresis..  Follow.   DVT prophylaxis: SCDs Code Status: Full Family Communication: Updated patient.  No family at bedside. Disposition Plan: To be determined.  Likely home when clinically improved, afebrile x24 to 48 hours, improvement with leukocytosis.   Consultants:   Gastroenterology: Dr. Rush Landmark 02/05/2019  ID: Dr. Megan Salon 02/09/2019  Procedures:   CT abdomen and pelvis 02/02/2019, 02/08/2019  Chest x-ray 02/02/2019, 02/05/2019, 02/07/2019  Upper endoscopy per Dr. Rush Landmark 02/08/2019  2 units packed red blood cells 02/09/2019  2D echo 02/09/2019  Antimicrobials:   IV Ancef 02/05/2019>>>>>> 02/08/2019  IV cefepime 02/02/2019>>>> 02/03/2019  IV Rocephin  02/03/2019>>>>> 02/05/2019  IV Rocephin  02/08/2019   Subjective: Patient in bed.  States shortness of breath and chest pain improved.  States abdominal pain improved.  Denies on any overt bleeding.   Objective: Vitals:   02/09/19 2023 02/10/19 0456 02/10/19 1122 02/10/19 1444  BP:  121/89  135/85  Pulse:  81 (!) 109 (!) 102  Resp:  (!) 22  (!) 21   Temp: 99 F (37.2 C) 98.3 F (36.8 C)  99.3 F (37.4 C)  TempSrc: Oral Axillary  Oral  SpO2:  97% 95% 92%  Weight:  89.6 kg    Height:        Intake/Output Summary (Last 24 hours) at 02/10/2019 1446 Last data filed at 02/10/2019 1357 Gross per 24 hour  Intake 1496 ml  Output 4650 ml  Net -3154 ml   Filed Weights   02/08/19 1048 02/09/19 0533 02/10/19 0456  Weight: 97.6 kg 77.9 kg 89.6 kg    Examination:  General exam: NAD Respiratory system: Decreased bibasilar crackles.  No wheezing.  Speaking in full sentences.  No use of accessory muscles of respiration.  Normal respiratory effort.   Cardiovascular system: Regular rate rhythm no murmurs rubs or gallops.  No JVD.  No lower extremity edema.  Gastrointestinal system: Abdomen is nondistended, soft, nontender, positive bowel sounds.  No rebound.  No guarding.  Central nervous system: Alert and oriented. No focal neurological deficits. Extremities: Symmetric 5 x 5 power. Skin: No rashes, lesions or ulcers Psychiatry: Judgement and insight appear normal. Mood & affect appropriate.     Data Reviewed: I have personally reviewed following labs and imaging studies  CBC: Recent Labs  Lab 02/05/19 0610 02/07/19 0523 02/08/19 0458 02/09/19 0451 02/09/19 2227 02/10/19 0343  WBC 17.2* 16.7* 19.7* 14.1*  --  15.4*  NEUTROABS  --   --  16.4* 11.5*  --  12.5*  HGB 7.9* 7.3* 8.3* 6.7* 8.9* 9.0*  HCT 24.1* 21.7* 25.6* 20.2* 26.7* 27.3*  MCV 66.9* 65.0* 66.7* 65.8*  --  70.7*  PLT 72* 110* 148* 194  --  [id number]   Basic Metabolic Panel: Recent Labs  Lab 02/05/19 0610 02/07/19 0523 02/08/19 0458 02/09/19 0451 02/10/19 0343  NA 139 138 139 140 137  K 3.5 2.5* 3.5 3.1* 3.7  CL 111 104 102 101 96*  CO2 21* 25 27 29 31   GLUCOSE 70 82 86 90 101*  BUN 36* 28* 21* 13 10  CREATININE 1.87* 1.35* 1.02* 0.88 0.74  CALCIUM 7.9* 8.0* 7.8* 7.6* 7.8*  MG  --  1.5* 1.9 1.8 2.2   GFR: Estimated Creatinine Clearance: 106 mL/min (by C-G  formula based on SCr of 0.74 mg/dL). Liver Function Tests: Recent Labs  Lab 02/05/19 0610 02/07/19 0523  AST 23 16  ALT 16 6  ALKPHOS 158* 147*  BILITOT 1.1 0.8  PROT 4.6* 4.5*  ALBUMIN 1.7* 1.6*   No results for input(s): LIPASE, AMYLASE in the last 168 hours. No results for input(s): AMMONIA in the last 168 hours. Coagulation Profile: No results for input(s): INR, PROTIME in the last 168 hours. Cardiac Enzymes: No results for input(s): CKTOTAL, CKMB, CKMBINDEX, TROPONINI in the last 168 hours. BNP (last 3 results) No results for input(s): PROBNP in the last 8760 hours. HbA1C: No results for input(s): HGBA1C in the last 72 hours. CBG: Recent Labs  Lab 02/09/19 2012 02/09/19 2357 02/10/19 0453 02/10/19 0744 02/10/19 1129  GLUCAP 107* 92 92 119* 110*   Lipid Profile: No results for input(s): CHOL, HDL, LDLCALC, TRIG,  CHOLHDL, LDLDIRECT in the last 72 hours. Thyroid Function Tests: No results for input(s): TSH, T4TOTAL, FREET4, T3FREE, THYROIDAB in the last 72 hours. Anemia Panel: No results for input(s): VITAMINB12, FOLATE, FERRITIN, TIBC, IRON, RETICCTPCT in the last 72 hours. Sepsis Labs: No results for input(s): PROCALCITON, LATICACIDVEN in the last 168 hours.  Recent Results (from the past 240 hour(s))  SARS Coronavirus 2 [facility] order, Performed in [facility] hospital lab) Nasopharyngeal Nasopharyngeal Swab     Status: None   Collection Time: 02/02/19  3:10 PM   Specimen: Nasopharyngeal Swab  Result Value Ref Range Status   SARS Coronavirus 2 NEGATIVE NEGATIVE Final    Comment: (NOTE) If result is NEGATIVE SARS-CoV-2 target nucleic acids are NOT DETECTED. The SARS-CoV-2 RNA is generally detectable in upper and lower  respiratory specimens during the acute phase of infection. The lowest  concentration of SARS-CoV-2 viral copies this assay can detect is 250  copies / mL. A negative result does not preclude SARS-CoV-2 infection  and should not be used as the  sole basis for treatment or other  patient management decisions.  A negative result may occur with  improper specimen collection / handling, submission of specimen other  than nasopharyngeal swab, presence of viral mutation(s) within the  areas targeted by this assay, and inadequate number of viral copies  (<250 copies / mL). A negative result must be combined with clinical  observations, patient history, and epidemiological information. If result is POSITIVE SARS-CoV-2 target nucleic acids are DETECTED. The SARS-CoV-2 RNA is generally detectable in upper and lower  respiratory specimens dur ing the acute phase of infection.  Positive  results are indicative of active infection with SARS-CoV-2.  Clinical  correlation with patient history and other diagnostic information is  necessary to determine patient infection status.  Positive results do  not rule out bacterial infection or co-infection with other viruses. If result is PRESUMPTIVE POSTIVE SARS-CoV-2 nucleic acids MAY BE PRESENT.   A presumptive positive result was obtained on the submitted specimen  and confirmed on repeat testing.  While 2019 novel coronavirus  (SARS-CoV-2) nucleic acids may be present in the submitted sample  additional confirmatory testing may be necessary for epidemiological  and / or clinical management purposes  to differentiate between  SARS-CoV-2 and other Sarbecovirus currently known to infect humans.  If clinically indicated additional testing with an alternate test  methodology [phone]) is advised. The SARS-CoV-2 RNA is generally  detectable in upper and lower respiratory sp ecimens during the acute  phase of infection. The expected result is Negative. Fact Sheet for Patients:  StrictlyIdeas.no Fact Sheet for Healthcare Providers: BankingDealers.co.za This test is not yet approved or cleared by the [place] FDA and has been authorized for detection  and/or diagnosis of SARS-CoV-2 by FDA under an Emergency Use Authorization (EUA).  This EUA will remain in effect (meaning this test can be used) for the duration of the COVID-19 declaration under Section 564(b)(1) of the Act, 21 U.S.C. section 360bbb-3(b)(1), unless the authorization is terminated or revoked sooner. Performed at [facility], [place] [address] [place], [place] [address]   Urine Culture     Status: Abnormal   Collection Time: 02/02/19  3:10 PM   Specimen: Urine, Clean Catch  Result Value Ref Range Status   Specimen Description   Final    URINE, CLEAN CATCH Performed at [facility], [place] [address] [place], [place] [address]    Special Requests   Final  NONE Performed at [facility], [place] [address] [place], [place] [address]    Culture 30,000 COLONIES/mL ESCHERICHIA COLI (A)  Final   Report Status 02/05/2019 FINAL  Final   Organism ID, Bacteria ESCHERICHIA COLI (A)  Final      Susceptibility   Escherichia coli - MIC*    AMPICILLIN <=2 SENSITIVE Sensitive     CEFAZOLIN <=4 SENSITIVE Sensitive     CEFTRIAXONE <=1 SENSITIVE Sensitive     CIPROFLOXACIN <=0.25 SENSITIVE Sensitive     GENTAMICIN <=1 SENSITIVE Sensitive     IMIPENEM <=0.25 SENSITIVE Sensitive     NITROFURANTOIN <=16 SENSITIVE Sensitive     TRIMETH/SULFA <=20 SENSITIVE Sensitive     AMPICILLIN/SULBACTAM <=2 SENSITIVE Sensitive     PIP/TAZO <=4 SENSITIVE Sensitive     Extended ESBL NEGATIVE Sensitive     * 30,000 COLONIES/mL ESCHERICHIA COLI  Blood Culture (routine x 2)     Status: Abnormal   Collection Time: 02/02/19  4:53 PM   Specimen: BLOOD LEFT FOREARM  Result Value Ref Range Status   Specimen Description   Final    BLOOD LEFT FOREARM Performed at [place] [address] [place], [place] [address]    Special Requests   Final    BOTTLES DRAWN AEROBIC AND ANAEROBIC Blood Culture adequate volume Performed at  [place] [address] [place], [place] [address]    Culture  Setup Time   Final    GRAM NEGATIVE RODS IN BOTH AEROBIC AND ANAEROBIC BOTTLES CRITICAL RESULT CALLED TO, READ BACK BY AND VERIFIED WITH: PHARMD M LILLISTONE [id number] AT 1021 AM BY CM Performed at [facility], [place] [address], [place], [place] [address]    Culture ESCHERICHIA COLI (A)  Final   Report Status 02/05/2019 FINAL  Final   Organism ID, Bacteria ESCHERICHIA COLI  Final      Susceptibility   Escherichia coli - MIC*    AMPICILLIN <=2 SENSITIVE Sensitive     CEFAZOLIN <=4 SENSITIVE Sensitive     CEFEPIME <=1 SENSITIVE Sensitive     CEFTAZIDIME <=1 SENSITIVE Sensitive     CEFTRIAXONE <=1 SENSITIVE Sensitive     CIPROFLOXACIN <=0.25 SENSITIVE Sensitive     GENTAMICIN <=1 SENSITIVE Sensitive     IMIPENEM <=0.25 SENSITIVE Sensitive     TRIMETH/SULFA <=20 SENSITIVE Sensitive     AMPICILLIN/SULBACTAM <=2 SENSITIVE Sensitive     PIP/TAZO <=4 SENSITIVE Sensitive     Extended ESBL NEGATIVE Sensitive     * ESCHERICHIA COLI  Blood Culture ID Panel (Reflexed)     Status: Abnormal   Collection Time: 02/02/19  4:53 PM  Result Value Ref Range Status   Enterococcus species NOT DETECTED NOT DETECTED Final   Listeria monocytogenes NOT DETECTED NOT DETECTED Final   Staphylococcus species NOT DETECTED NOT DETECTED Final   Staphylococcus aureus (BCID) NOT DETECTED NOT DETECTED Final   Streptococcus species NOT DETECTED NOT DETECTED Final   Streptococcus agalactiae NOT DETECTED NOT DETECTED Final   Streptococcus pneumoniae NOT DETECTED NOT DETECTED Final   Streptococcus pyogenes NOT DETECTED NOT DETECTED Final   Acinetobacter baumannii NOT DETECTED NOT DETECTED Final   Enterobacteriaceae species DETECTED (A) NOT DETECTED Final    Comment: Enterobacteriaceae represent a large family of gram-negative bacteria, not a single organism. CRITICAL RESULT CALLED TO, READ BACK BY AND VERIFIED WITH: PHARMD M  LILLISTONE [id number] AT 1022 BY CM    Enterobacter cloacae complex NOT DETECTED NOT DETECTED Final  Escherichia coli DETECTED (A) NOT DETECTED Final    Comment: CRITICAL RESULT CALLED TO, READ BACK BY AND VERIFIED WITH: PHARMD M LILLISTONE [id number] AT 1022 AM BY CM    Klebsiella oxytoca NOT DETECTED NOT DETECTED Final   Klebsiella pneumoniae NOT DETECTED NOT DETECTED Final   Proteus species NOT DETECTED NOT DETECTED Final   Serratia marcescens NOT DETECTED NOT DETECTED Final   Carbapenem resistance NOT DETECTED NOT DETECTED Final   Haemophilus influenzae NOT DETECTED NOT DETECTED Final   Neisseria meningitidis NOT DETECTED NOT DETECTED Final   Pseudomonas aeruginosa NOT DETECTED NOT DETECTED Final   Candida albicans NOT DETECTED NOT DETECTED Final   Candida glabrata NOT DETECTED NOT DETECTED Final   Candida krusei NOT DETECTED NOT DETECTED Final   Candida parapsilosis NOT DETECTED NOT DETECTED Final   Candida tropicalis NOT DETECTED NOT DETECTED Final    Comment: Performed at [facility], [place] [address] [place], [place] [address]  Blood Culture (routine x 2)     Status: Abnormal   Collection Time: 02/02/19  5:53 PM   Specimen: BLOOD  Result Value Ref Range Status   Specimen Description   Final    BLOOD RIGHT ANTECUBITAL Performed at [place] [address], [place], [place] [address]    Special Requests   Final    BOTTLES DRAWN AEROBIC AND ANAEROBIC Blood Culture results may not be optimal due to an excessive volume of blood received in culture bottles Performed at [place] [address], [place], [place] [address]    Culture  Setup Time   Final    GRAM NEGATIVE RODS IN BOTH AEROBIC AND ANAEROBIC BOTTLES CRITICAL RESULT CALLED TO, READ BACK BY AND VERIFIED WITH: PHARMD M LILLISTONE [id number] AT 56 AM BY CM    Culture (A)  Final    ESCHERICHIA COLI SUSCEPTIBILITIES PERFORMED ON PREVIOUS CULTURE WITHIN THE LAST 5  DAYS. Performed at [facility], [place] [address] [place], [place] [address]    Report Status 02/05/2019 FINAL  Final  Culture, blood (Routine X 2) w Reflex to ID Panel     Status: None (Preliminary result)   Collection Time: 02/07/19 10:40 AM   Specimen: BLOOD  Result Value Ref Range Status   Specimen Description   Final    BLOOD RIGHT ARM Performed at [place] [address], [place], [place] [address]    Special Requests   Final    BOTTLES DRAWN AEROBIC ONLY Blood Culture adequate volume Performed at [place] [address], [place], [place] [address]    Culture   Final    NO GROWTH 2 DAYS Performed at [place] [address] [place], [place] [address]    Report Status PENDING  Incomplete  Culture, blood (Routine X 2) w Reflex to ID Panel     Status: None (Preliminary result)   Collection Time: 02/07/19 10:45 AM   Specimen: BLOOD LEFT HAND  Result Value Ref Range Status   Specimen Description   Final    BLOOD LEFT HAND Performed at [place] [address], [place], [place] [address]    Special Requests   Final    BOTTLES DRAWN AEROBIC ONLY Blood Culture results may not be optimal due to an inadequate volume of blood received in culture bottles Performed at [facility] [address], [place], [place] [address]    Culture   Final  NO GROWTH 2 DAYS Performed at [facility], [place] [address] [place], [place] [address]    Report Status PENDING  Incomplete         Radiology Studies: Ct Abdomen Pelvis W Contrast  Result Date: 02/08/2019 CLINICAL DATA:  Fever and worsening elevated white blood cell count EXAM: CT ABDOMEN AND PELVIS WITH CONTRAST TECHNIQUE: Multidetector CT imaging of the abdomen and pelvis was performed using the standard protocol following bolus administration of intravenous contrast. CONTRAST:  100 mL Omnipaque 300 COMPARISON:  02/02/2019,  04/02/2018, 09/13/2017 FINDINGS: Lower chest: Bilateral pleural effusions are noted right greater than left with associated bibasilar atelectatic changes. No focal confluent infiltrate is seen. No parenchymal nodules are noted. Hepatobiliary: No focal liver abnormality is seen. Status post cholecystectomy. No biliary dilatation. Pancreas: Unremarkable. No pancreatic ductal dilatation or surrounding inflammatory changes. Spleen: Normal in size without focal abnormality. Adrenals/Urinary Tract: Adrenal glands are within normal limits bilaterally. Kidneys demonstrate fullness of the collecting systems with irregular enhancement pattern similar to the density difference is noted on the prior exam consistent with pyelonephritis. Delayed images demonstrate mild excretion of contrast material. No definitive calculi are seen. The bladder is well distended. There is an enhancing nodule identified in the posterior aspect of the bladder just to the right of the midline similar to that seen on the prior exam from 2019. given its stability this is likely benign in etiology. Stomach/Bowel: Colon demonstrates scattered fecal material throughout. No obstructive changes are seen. The appendix is air-filled and within normal limits. No small bowel abnormality is seen. Postsurgical changes consistent with gastric bypass are noted. Vascular/Lymphatic: Aortic atherosclerosis. No enlarged abdominal or pelvic lymph nodes. Reproductive: Uterus and bilateral adnexa are unremarkable. Other: No abdominal wall hernia or abnormality. No abdominopelvic ascites. Musculoskeletal: Mild degenerative changes of lumbar spine are noted. IMPRESSION: Changes consistent with bilateral pyelonephritis better delineated on the current exam due to the administration of contrast material. Slight increase in fullness of the renal collecting systems is noted when compared with the prior study although no definitive obstructing lesions are seen. No abscess is  identified. Bilateral pleural effusions right greater than left with associated atelectatic changes. Stable enhancing nodule along the posterior aspect of the bladder just to the right of the midline unchanged from 2019. Electronically Signed   By: Inez Catalina M.D.   On: 02/08/2019 22:41        Scheduled Meds: . sodium chloride   Intravenous Once  . amitriptyline  75 mg Oral QHS  . docusate sodium  100 mg Oral Daily  . furosemide  20 mg Intravenous Daily  . gabapentin  300 mg Oral TID  . insulin aspart  0-9 Units Subcutaneous Q4H  . lisinopril  10 mg Oral Daily  . magic mouthwash  10 mL Oral QID  . ondansetron (ZOFRAN) IV  4 mg Intravenous Q6H   Or  . ondansetron  4 mg Oral Q6H  . pantoprazole (PROTONIX) IV  40 mg Intravenous Q12H  . potassium chloride  40 mEq Oral Daily   Continuous Infusions: . sodium chloride 250 mL (02/07/19 0901)  . cefTRIAXone (ROCEPHIN)  IV 2 g (02/09/19 2022)     LOS: 8 days    Time spent: 40 minutes    Irine Seal, MD [facility]  If 7PM-7AM, please contact night-coverage [email or web address] 02/10/2019, 2:46 PM

## 2019-02-10 NOTE — Progress Notes (Signed)
INFECTIOUS DISEASE PROGRESS NOTE  ID: Terri [name] is a 43 y.o. female with  Principal Problem:   Persistent fever Active Problems:   Essential hypertension   Sepsis ([place])   Leucocytosis   Thrombocytosis (HCC)   UTI (urinary tract infection)   Chest pain   Bacteremia due to Escherichia coli   Esophagitis   Hypokalemia   Hypomagnesemia   Covid-19 Virus not Detected   Gastric ulcer   S/P gastric bypass  Subjective: Feels better.   Abtx:  Anti-infectives (From admission, onward)   Start     Dose/Rate Route Frequency Ordered Stop   02/08/19 1830  cefTRIAXone (ROCEPHIN) 2 g in sodium chloride 0.9 % 100 mL IVPB     2 g 200 mL/hr over 30 Minutes Intravenous Every 24 hours 02/08/19 1756     02/05/19 1400  ceFAZolin (ANCEF) IVPB 2g/100 mL premix  Status:  Discontinued     2 g 200 mL/hr over 30 Minutes Intravenous Every 8 hours 02/05/19 1136 02/08/19 1756   02/03/19 1800  vancomycin (VANCOCIN) 1,250 mg in sodium chloride 0.9 % 250 mL IVPB  Status:  Discontinued     1,250 mg 166.7 mL/hr over 90 Minutes Intravenous Every 24 hours 02/02/19 2321 02/03/19 1103   02/03/19 1600  cefTRIAXone (ROCEPHIN) 2 g in sodium chloride 0.9 % 100 mL IVPB  Status:  Discontinued     2 g 200 mL/hr over 30 Minutes Intravenous Every 24 hours 02/03/19 1105 02/05/19 1136   02/03/19 0800  ceFEPIme (MAXIPIME) 2 g in sodium chloride 0.9 % 100 mL IVPB  Status:  Discontinued     2 g 200 mL/hr over 30 Minutes Intravenous Every 12 hours 02/02/19 2317 02/03/19 1103   02/02/19 1700  ceFEPIme (MAXIPIME) 2 g in sodium chloride 0.9 % 100 mL IVPB     2 g 200 mL/hr over 30 Minutes Intravenous  Once 02/02/19 1649 02/02/19 1837   02/02/19 1700  metroNIDAZOLE (FLAGYL) IVPB 500 mg     500 mg 100 mL/hr over 60 Minutes Intravenous  Once 02/02/19 1649 02/02/19 2126   02/02/19 1700  vancomycin (VANCOCIN) IVPB 1000 mg/200 mL premix  Status:  Discontinued     1,000 mg 200 mL/hr over 60 Minutes Intravenous  Once 02/02/19  1649 02/02/19 1653   02/02/19 1700  vancomycin (VANCOCIN) 1,750 mg in sodium chloride 0.9 % 500 mL IVPB     1,750 mg 250 mL/hr over 120 Minutes Intravenous  Once 02/02/19 1653 02/02/19 2011   02/02/19 1645  cefTRIAXone (ROCEPHIN) 1 g in sodium chloride 0.9 % 100 mL IVPB  Status:  Discontinued     1 g 200 mL/hr over 30 Minutes Intravenous  Once 02/02/19 1636 02/02/19 1657      Medications:  Scheduled: . sodium chloride   Intravenous Once  . amitriptyline  75 mg Oral QHS  . docusate sodium  100 mg Oral Daily  . furosemide  20 mg Intravenous Daily  . gabapentin  300 mg Oral TID  . insulin aspart  0-9 Units Subcutaneous Q4H  . lisinopril  10 mg Oral Daily  . magic mouthwash  10 mL Oral QID  . ondansetron (ZOFRAN) IV  4 mg Intravenous Q6H   Or  . ondansetron  4 mg Oral Q6H  . pantoprazole (PROTONIX) IV  40 mg Intravenous Q12H  . potassium chloride  40 mEq Oral Daily    Objective: Vital signs in last 24 hours: Temp:  [98.3 F (36.8 C)-99.9 F (37.7 C)]  99.3 F (37.4 C) (09/19 1444) Pulse Rate:  [81-109] 102 (09/19 1444) Resp:  [18-22] 21 (09/19 1444) BP: (121-135)/(69-89) 135/85 (09/19 1444) SpO2:  [92 %-97 %] 92 % (09/19 1444) Weight:  [89.6 kg] 89.6 kg (09/19 0456)   General appearance: alert, cooperative and no distress Resp: clear to auscultation bilaterally Cardio: regular rate and rhythm GI: normal findings: bowel sounds normal and soft, non-tender  Lab Results Recent Labs    02/09/19 0451 02/09/19 2227 02/10/19 0343  WBC 14.1*  --  15.4*  HGB 6.7* 8.9* 9.0*  HCT 20.2* 26.7* 27.3*  NA 140  --  137  K 3.1*  --  3.7  CL 101  --  96*  CO2 29  --  31  BUN 13  --  10  CREATININE 0.88  --  0.74   Liver Panel No results for input(s): PROT, ALBUMIN, AST, ALT, ALKPHOS, BILITOT, BILIDIR, IBILI in the last 72 hours. Sedimentation Rate No results for input(s): ESRSEDRATE in the last 72 hours. C-Reactive Protein No results for input(s): CRP in the last 72  hours.  Microbiology: Recent Results (from the past 240 hour(s))  SARS Coronavirus 2 [facility] order, Performed in [facility] hospital lab) Nasopharyngeal Nasopharyngeal Swab     Status: None   Collection Time: 02/02/19  3:10 PM   Specimen: Nasopharyngeal Swab  Result Value Ref Range Status   SARS Coronavirus 2 NEGATIVE NEGATIVE Final    Comment: (NOTE) If result is NEGATIVE SARS-CoV-2 target nucleic acids are NOT DETECTED. The SARS-CoV-2 RNA is generally detectable in upper and lower  respiratory specimens during the acute phase of infection. The lowest  concentration of SARS-CoV-2 viral copies this assay can detect is 250  copies / mL. A negative result does not preclude SARS-CoV-2 infection  and should not be used as the sole basis for treatment or other  patient management decisions.  A negative result may occur with  improper specimen collection / handling, submission of specimen other  than nasopharyngeal swab, presence of viral mutation(s) within the  areas targeted by this assay, and inadequate number of viral copies  (<250 copies / mL). A negative result must be combined with clinical  observations, patient history, and epidemiological information. If result is POSITIVE SARS-CoV-2 target nucleic acids are DETECTED. The SARS-CoV-2 RNA is generally detectable in upper and lower  respiratory specimens dur ing the acute phase of infection.  Positive  results are indicative of active infection with SARS-CoV-2.  Clinical  correlation with patient history and other diagnostic information is  necessary to determine patient infection status.  Positive results do  not rule out bacterial infection or co-infection with other viruses. If result is PRESUMPTIVE POSTIVE SARS-CoV-2 nucleic acids MAY BE PRESENT.   A presumptive positive result was obtained on the submitted specimen  and confirmed on repeat testing.  While 2019 novel coronavirus  (SARS-CoV-2) nucleic acids may be present  in the submitted sample  additional confirmatory testing may be necessary for epidemiological  and / or clinical management purposes  to differentiate between  SARS-CoV-2 and other Sarbecovirus currently known to infect humans.  If clinically indicated additional testing with an alternate test  methodology [id number]) is advised. The SARS-CoV-2 RNA is generally  detectable in upper and lower respiratory sp ecimens during the acute  phase of infection. The expected result is Negative. Fact Sheet for Patients:  StrictlyIdeas.no Fact Sheet for Healthcare Providers: BankingDealers.co.za This test is not yet approved or cleared by the [place] FDA  and has been authorized for detection and/or diagnosis of SARS-CoV-2 by FDA under an Emergency Use Authorization (EUA).  This EUA will remain in effect (meaning this test can be used) for the duration of the COVID-19 declaration under Section 564(b)(1) of the Act, 21 U.S.C. section 360bbb-3(b)(1), unless the authorization is terminated or revoked sooner. Performed at [facility], [place] [address], [place], [place] [address]   Urine Culture     Status: Abnormal   Collection Time: 02/02/19  3:10 PM   Specimen: Urine, Clean Catch  Result Value Ref Range Status   Specimen Description   Final    URINE, CLEAN CATCH Performed at [facility], [place] [address], [place], [place] [address]    Special Requests   Final    NONE Performed at [facility], [place] [address] [place], [place] [address]    Culture 30,000 COLONIES/mL ESCHERICHIA COLI (A)  Final   Report Status 02/05/2019 FINAL  Final   Organism ID, Bacteria ESCHERICHIA COLI (A)  Final      Susceptibility   Escherichia coli - MIC*    AMPICILLIN <=2 SENSITIVE Sensitive     CEFAZOLIN <=4 SENSITIVE Sensitive     CEFTRIAXONE <=1 SENSITIVE Sensitive     CIPROFLOXACIN <=0.25 SENSITIVE Sensitive      GENTAMICIN <=1 SENSITIVE Sensitive     IMIPENEM <=0.25 SENSITIVE Sensitive     NITROFURANTOIN <=16 SENSITIVE Sensitive     TRIMETH/SULFA <=20 SENSITIVE Sensitive     AMPICILLIN/SULBACTAM <=2 SENSITIVE Sensitive     PIP/TAZO <=4 SENSITIVE Sensitive     Extended ESBL NEGATIVE Sensitive     * 30,000 COLONIES/mL ESCHERICHIA COLI  Blood Culture (routine x 2)     Status: Abnormal   Collection Time: 02/02/19  4:53 PM   Specimen: BLOOD LEFT FOREARM  Result Value Ref Range Status   Specimen Description   Final    BLOOD LEFT FOREARM Performed at [facility] [address], [place], [place] [address]    Special Requests   Final    BOTTLES DRAWN AEROBIC AND ANAEROBIC Blood Culture adequate volume Performed at [place] [address], [place], [place] [address]    Culture  Setup Time   Final    GRAM NEGATIVE RODS IN BOTH AEROBIC AND ANAEROBIC BOTTLES CRITICAL RESULT CALLED TO, READ BACK BY AND VERIFIED WITH: PHARMD M LILLISTONE [id number] AT 1021 AM BY CM Performed at [facility], [place] [address] [place], [place] [address]    Culture ESCHERICHIA COLI (A)  Final   Report Status 02/05/2019 FINAL  Final   Organism ID, Bacteria ESCHERICHIA COLI  Final      Susceptibility   Escherichia coli - MIC*    AMPICILLIN <=2 SENSITIVE Sensitive     CEFAZOLIN <=4 SENSITIVE Sensitive     CEFEPIME <=1 SENSITIVE Sensitive     CEFTAZIDIME <=1 SENSITIVE Sensitive     CEFTRIAXONE <=1 SENSITIVE Sensitive     CIPROFLOXACIN <=0.25 SENSITIVE Sensitive     GENTAMICIN <=1 SENSITIVE Sensitive     IMIPENEM <=0.25 SENSITIVE Sensitive     TRIMETH/SULFA <=20 SENSITIVE Sensitive     AMPICILLIN/SULBACTAM <=2 SENSITIVE Sensitive     PIP/TAZO <=4 SENSITIVE Sensitive     Extended ESBL NEGATIVE Sensitive     * ESCHERICHIA COLI  Blood Culture ID Panel (Reflexed)     Status: Abnormal   Collection Time: 02/02/19  4:53 PM  Result Value Ref Range Status  Enterococcus  species NOT DETECTED NOT DETECTED Final   Listeria monocytogenes NOT DETECTED NOT DETECTED Final   Staphylococcus species NOT DETECTED NOT DETECTED Final   Staphylococcus aureus (BCID) NOT DETECTED NOT DETECTED Final   Streptococcus species NOT DETECTED NOT DETECTED Final   Streptococcus agalactiae NOT DETECTED NOT DETECTED Final   Streptococcus pneumoniae NOT DETECTED NOT DETECTED Final   Streptococcus pyogenes NOT DETECTED NOT DETECTED Final   Acinetobacter baumannii NOT DETECTED NOT DETECTED Final   Enterobacteriaceae species DETECTED (A) NOT DETECTED Final    Comment: Enterobacteriaceae represent a large family of gram-negative bacteria, not a single organism. CRITICAL RESULT CALLED TO, READ BACK BY AND VERIFIED WITH: PHARMD M LILLISTONE [id number] AT 1022 BY CM    Enterobacter cloacae complex NOT DETECTED NOT DETECTED Final   Escherichia coli DETECTED (A) NOT DETECTED Final    Comment: CRITICAL RESULT CALLED TO, READ BACK BY AND VERIFIED WITH: PHARMD M LILLISTONE [id number] AT 62 AM BY CM    Klebsiella oxytoca NOT DETECTED NOT DETECTED Final   Klebsiella pneumoniae NOT DETECTED NOT DETECTED Final   Proteus species NOT DETECTED NOT DETECTED Final   Serratia marcescens NOT DETECTED NOT DETECTED Final   Carbapenem resistance NOT DETECTED NOT DETECTED Final   Haemophilus influenzae NOT DETECTED NOT DETECTED Final   Neisseria meningitidis NOT DETECTED NOT DETECTED Final   Pseudomonas aeruginosa NOT DETECTED NOT DETECTED Final   Candida albicans NOT DETECTED NOT DETECTED Final   Candida glabrata NOT DETECTED NOT DETECTED Final   Candida krusei NOT DETECTED NOT DETECTED Final   Candida parapsilosis NOT DETECTED NOT DETECTED Final   Candida tropicalis NOT DETECTED NOT DETECTED Final    Comment: Performed at [facility], [place] [address], [place], [place] [address]  Blood Culture (routine x 2)     Status: Abnormal   Collection Time: 02/02/19  5:53 PM   Specimen: BLOOD  Result Value Ref  Range Status   Specimen Description   Final    BLOOD RIGHT ANTECUBITAL Performed at [place] [address] [place], [place] [address]    Special Requests   Final    BOTTLES DRAWN AEROBIC AND ANAEROBIC Blood Culture results may not be optimal due to an excessive volume of blood received in culture bottles Performed at [facility] [address], [place], [place] [address]    Culture  Setup Time   Final    GRAM NEGATIVE RODS IN BOTH AEROBIC AND ANAEROBIC BOTTLES CRITICAL RESULT CALLED TO, READ BACK BY AND VERIFIED WITH: PHARMD M LILLISTONE [id number] AT 46 AM BY CM    Culture (A)  Final    ESCHERICHIA COLI SUSCEPTIBILITIES PERFORMED ON PREVIOUS CULTURE WITHIN THE LAST 5 DAYS. Performed at [facility], [place] [address] [place], [place] [address]    Report Status 02/05/2019 FINAL  Final  Culture, blood (Routine X 2) w Reflex to ID Panel     Status: None (Preliminary result)   Collection Time: 02/07/19 10:40 AM   Specimen: BLOOD  Result Value Ref Range Status   Specimen Description   Final    BLOOD RIGHT ARM Performed at [facility] [address], [place], [place] [address]    Special Requests   Final    BOTTLES DRAWN AEROBIC ONLY Blood Culture adequate volume Performed at [facility] [address], [place], [place] [address]    Culture   Final    NO GROWTH 2 DAYS Performed  at [facility], [place] [address], [place], [place] [address]    Report Status PENDING  Incomplete  Culture, blood (Routine X 2) w Reflex to ID Panel     Status: None (Preliminary result)   Collection Time: 02/07/19 10:45 AM   Specimen: BLOOD LEFT HAND  Result Value Ref Range Status   Specimen Description   Final    BLOOD LEFT HAND Performed at [facility] [address], [place], [place] [address]    Special Requests   Final    BOTTLES DRAWN AEROBIC ONLY Blood Culture results may not be  optimal due to an inadequate volume of blood received in culture bottles Performed at [facility] [address] [place], [place] [address]    Culture   Final    NO GROWTH 2 DAYS Performed at [facility] [address] [place], [place] [address]    Report Status PENDING  Incomplete    Studies/Results: Ct Abdomen Pelvis W Contrast  Result Date: 02/08/2019 CLINICAL DATA:  Fever and worsening elevated white blood cell count EXAM: CT ABDOMEN AND PELVIS WITH CONTRAST TECHNIQUE: Multidetector CT imaging of the abdomen and pelvis was performed using the standard protocol following bolus administration of intravenous contrast. CONTRAST:  100 mL Omnipaque 300 COMPARISON:  02/02/2019, 04/02/2018, 09/13/2017 FINDINGS: Lower chest: Bilateral pleural effusions are noted right greater than left with associated bibasilar atelectatic changes. No focal confluent infiltrate is seen. No parenchymal nodules are noted. Hepatobiliary: No focal liver abnormality is seen. Status post cholecystectomy. No biliary dilatation. Pancreas: Unremarkable. No pancreatic ductal dilatation or surrounding inflammatory changes. Spleen: Normal in size without focal abnormality. Adrenals/Urinary Tract: Adrenal glands are within normal limits bilaterally. Kidneys demonstrate fullness of the collecting systems with irregular enhancement pattern similar to the density difference is noted on the prior exam consistent with pyelonephritis. Delayed images demonstrate mild excretion of contrast material. No definitive calculi are seen. The bladder is well distended. There is an enhancing nodule identified in the posterior aspect of the bladder just to the right of the midline similar to that seen on the prior exam from 2019. given its stability this is likely benign in etiology. Stomach/Bowel: Colon demonstrates scattered fecal material throughout. No obstructive changes are seen. The appendix is air-filled and within  normal limits. No small bowel abnormality is seen. Postsurgical changes consistent with gastric bypass are noted. Vascular/Lymphatic: Aortic atherosclerosis. No enlarged abdominal or pelvic lymph nodes. Reproductive: Uterus and bilateral adnexa are unremarkable. Other: No abdominal wall hernia or abnormality. No abdominopelvic ascites. Musculoskeletal: Mild degenerative changes of lumbar spine are noted. IMPRESSION: Changes consistent with bilateral pyelonephritis better delineated on the current exam due to the administration of contrast material. Slight increase in fullness of the renal collecting systems is noted when compared with the prior study although no definitive obstructing lesions are seen. No abscess is identified. Bilateral pleural effusions right greater than left with associated atelectatic changes. Stable enhancing nodule along the posterior aspect of the bladder just to the right of the midline unchanged from 2019. Electronically Signed   By: Inez Catalina M.D.   On: 02/08/2019 22:41     Assessment/Plan: Pyelonephritis, bilateral E coli bacteremia Anemia- gastric ulcer, gastritis, esophagitis  Total days of antibiotics: 9 (ceftriaxone 3)  afebrile Would continue ceftriaxone for now.  Can be d/c with 1 week of levaquin 750mg  qday when ready.  Appreciate GI f/u for her anemia.  D/i Dr [place]ville Silos  Bobby Rumpf MD, FACP Infectious Diseases (pager) [phone] [email or web address] 02/10/2019, 2:47 PM  LOS: 8 days

## 2019-02-11 DIAGNOSIS — A4151 Sepsis due to Escherichia coli [E. coli]: Secondary | ICD-10-CM | POA: Diagnosis present

## 2019-02-11 DIAGNOSIS — K253 Acute gastric ulcer without hemorrhage or perforation: Secondary | ICD-10-CM

## 2019-02-11 DIAGNOSIS — N10 Acute tubulo-interstitial nephritis: Secondary | ICD-10-CM | POA: Diagnosis present

## 2019-02-11 DIAGNOSIS — N179 Acute kidney failure, unspecified: Secondary | ICD-10-CM

## 2019-02-11 LAB — CBC WITH DIFFERENTIAL/PLATELET
Abs Immature Granulocytes: 0.21 10*3/uL — ABNORMAL HIGH (ref 0.00–0.07)
Basophils Absolute: 0.1 10*3/uL (ref 0.0–0.1)
Basophils Relative: 1 %
Eosinophils Absolute: 0.2 10*3/uL (ref 0.0–0.5)
Eosinophils Relative: 1 %
HCT: 35.6 % — ABNORMAL LOW (ref 36.0–46.0)
Hemoglobin: 11.2 g/dL — ABNORMAL LOW (ref 12.0–15.0)
Immature Granulocytes: 2 %
Lymphocytes Relative: 13 %
Lymphs Abs: 1.9 10*3/uL (ref 0.7–4.0)
MCH: 23.3 pg — ABNORMAL LOW (ref 26.0–34.0)
MCHC: 31.5 g/dL (ref 30.0–36.0)
MCV: 74 fL — ABNORMAL LOW (ref 80.0–100.0)
Monocytes Absolute: 0.7 10*3/uL (ref 0.1–1.0)
Monocytes Relative: 5 %
Neutro Abs: 11.3 10*3/uL — ABNORMAL HIGH (ref 1.7–7.7)
Neutrophils Relative %: 78 %
Platelets: 465 10*3/uL — ABNORMAL HIGH (ref 150–400)
RBC: 4.81 MIL/uL (ref 3.87–5.11)
RDW: 22.9 % — ABNORMAL HIGH (ref 11.5–15.5)
WBC: 14.3 10*3/uL — ABNORMAL HIGH (ref 4.0–10.5)
nRBC: 0 % (ref 0.0–0.2)

## 2019-02-11 LAB — BASIC METABOLIC PANEL
Anion gap: 12 (ref 5–15)
BUN: 10 mg/dL (ref 6–20)
CO2: 28 mmol/L (ref 22–32)
Calcium: 9 mg/dL (ref 8.9–10.3)
Chloride: 99 mmol/L (ref 98–111)
Creatinine, Ser: 0.8 mg/dL (ref 0.44–1.00)
GFR calc Af Amer: >60 mL/min (ref >60)
GFR calc non Af Amer: >60 mL/min (ref >60)
Glucose, Bld: 100 mg/dL — ABNORMAL HIGH (ref 70–99)
Potassium: 5.1 mmol/L (ref 3.5–5.1)
Sodium: 139 mmol/L (ref 135–145)

## 2019-02-11 LAB — URINE CULTURE: Culture: <10000 — AB

## 2019-02-11 LAB — GLUCOSE, CAPILLARY
Glucose-Capillary: 91 mg/dL (ref 70–99)
Glucose-Capillary: 92 mg/dL (ref 70–99)
Glucose-Capillary: 111 mg/dL — ABNORMAL HIGH (ref 70–99)

## 2019-02-11 MED ORDER — SENNA 8.6 MG PO TABS: 1.0000 | ORAL_TABLET | Freq: Every day | ORAL | 0 refills | Status: DC

## 2019-02-11 MED ORDER — LEVOFLOXACIN 750 MG PO TABS: 750.0000 mg | ORAL_TABLET | Freq: Every day | ORAL | 0 refills | Status: AC

## 2019-02-11 MED ORDER — PANTOPRAZOLE SODIUM 40 MG PO TBEC: 40.0000 mg | DELAYED_RELEASE_TABLET | Freq: Two times a day (BID) | ORAL | 3 refills | Status: DC

## 2019-02-11 MED ORDER — TRAMADOL HCL 50 MG PO TABS: 50.0000 mg | ORAL_TABLET | Freq: Four times a day (QID) | ORAL | 0 refills | Status: DC | PRN

## 2019-02-11 NOTE — Progress Notes (Signed)
SATURATION QUALIFICATIONS: (This note is used to comply with regulatory documentation for home oxygen)  Patient Saturations on Room Air at Rest = 93%  Patient Saturations on Room Air while Ambulating = 97-100%

## 2019-02-11 NOTE — Progress Notes (Signed)
Pt received discharge, follow up, and medication instructions, verbalized understanding, IV and telemetry monitor removed, personal belongings and prescriptions with patient, family to transport home

## 2019-02-11 NOTE — Discharge Summary (Signed)
Physician Discharge Summary  Terri [name] [id number] DOB: [birth year] DOA: 02/02/2019  PCP: Terri Ebbs, MD  Admit date: 02/02/2019 Discharge date: 02/11/2019  Time spent: 60 minutes  Recommendations for Outpatient Follow-up:  1. Follow-up with Terri Ebbs, MD in 2 weeks.  On follow-up patient need a basic metabolic profile, magnesium level done to follow-up on electrolytes and renal function.  Patient need a CBC done to follow-up on H&H. 2. Follow-up with Dr. Rush [name], GI in 3 weeks.  On follow-up patient will likely need a formal upper GI series with small bowel follow-through to follow-up on fistula.  Patient will also need follow-up EGD in 3 months for follow-up on surveillance healing.   Discharge Diagnoses:  Principal Problem:   Sepsis due to Escherichia coli (E. coli) (HCC) Active Problems:   Bacteremia due to Escherichia coli   Acute pyelonephritis   Persistent fever   Essential hypertension   Sepsis (HCC)   Leucocytosis   Thrombocytosis (HCC)   UTI (urinary tract infection)   Chest pain   Esophagitis   Hypokalemia   Hypomagnesemia   Covid-19 Virus not Detected   Gastric ulcer   S/P gastric bypass   AKI (acute kidney injury) ([place])   Discharge Condition: Stable and improved  Diet recommendation: Heart healthy  Filed Weights   02/09/19 0533 02/10/19 0456 02/11/19 0500  Weight: 77.9 kg 89.6 kg 89.6 kg    History of present illness:  HPI per Dr. Marvell [name] is a 43 y.o. female with medical history significant of depression, bipolar disorder, chronic back pain, hypertension, previous DVT, migraine headaches and previous pneumonia who presented to the ER with complaint of shortness of breath cough generalized weakness.  Patient was seen and evaluated.  She has had some chills headaches with vomiting and diarrhea over the last 4 days.  She had been taking Tylenol and NyQuil but no response.  Patient was found to be mildly hypoxic on arrival and placed  on oxygen.  She had elevated lactic acid and symptoms suggestive of sepsis.  Chest x-ray was not revealing.  Urinalysis suggested UTI as possible cause of her sepsis.  Patient is being admitted to the hospital for work-up..  ED Course: Temperature is 99.9 blood pressure 81/52 pulse 115 respirate of 32 oxygen sat 91% on 2 L.  White count is 12.6 hemoglobin 9.1 platelets 119.  Potassium 3.4 creatinine 1.7 BUN 30 and calcium of 7.8.  Lactic acid 2.8.  Urinalysis showed cloudy urine with large leukocytes.  Many bacteria WBC 21-50.  Chest x-ray showed no active disease.  CT abdomen pelvis showed bilateral renal enlargement with heterogeneous attenuation.  Consistent with pyelonephritis.  Patient is being admitted for treatment and early sepsis.  Hospital Course:  1 sepsis secondary to E. coli bacteremia, E. coli pyelonephritis, POA Patient presented with some worsening shortness of breath, cough, generalized weakness, noted to be hypotensive with blood pressure of 81/52 on admission with a leukocytosis, hypoxia, CT abdomen and pelvis consistent with pyelonephritis.  Patient admitted and pancultured.  Urinalysis with 30,000 colonies of E. coli which was pansensitive.  Blood cultures positive for E. coli.  Patient with fever curve trending down however noted to have fluctuating fevers as well as a fluctuating leukocytosis.  Repeat blood cultures which are downward with no growth to date. CT abdomen and pelvis which was done was negative for perinephric abscess.  Due to fluctuating fevers and leukocytosis ID was consulted. IV antibiotics were subsequently changed from Ancef to IV Rocephin.  ID consulted and recommended ongoing management.  Patient did not have any further fevers.  Leukocytosis trended down.  Patient be discharged home on Levaquin 750 mg daily x7 more days to complete a 10-day course of antibiotic treatment per ID recommendations.  Outpatient follow-up.   2.  Hypertension Blood pressure remained  stable.  Patient was maintained on lisinopril and was on IV Lasix for diuresis.  Patient's HCTZ was discontinued.  Patient blood pressure remained stable.  Patient was discharged home back on her home regimen of lisinopril HCTZ.  Outpatient follow-up with PCP.   3.  Chest pain/pulmonary edema/probable acute diastolic CHF exacerbation Patient with complaints of chest pain with deep inspiration as well as shortness of breath during the hospitalization.  Felt likely secondary to aggressive hydration with IV fluids as patient noted to be hypotensive on admission. Patient however initially refused troponins.  Patient however agreed and troponins were drawn which were flat and negative.  EKG ordered with normal sinus rhythm.  CXR done with diffuse bilateral interstitial pulmonary opacity most consistent with edema and small bilateral effusions.  2D echo ordered with normal EF with no wall motion abnormalities.  Patient was placed on Lasix 20 mg IV daily with good urine output and clinical improvement.  Patient's HCTZ was held while she was on Lasix.  Patient was -7.256 L during this hospitalization.  Patient's HCTZ was resumed on discharge.  Outpatient follow-up with PCP.  4.  Hypokalemia/hypomagnesemia Secondary to diuresis.    Potassium and magnesium were repleted by day of discharge.  Outpatient follow-up.    5.  Iron deficiency anemia/??  Acute blood loss anemia Patient with no overt bleeding reported.  Patient with complaints of upper epigastric pain.  Hemoglobin dropped as low as 6.7 from 8.3.  Patient transfused 2 units packed red blood cells on 02/09/2019 with hemoglobin improving and stabilizing.  Hemoglobin was 11.2 by day of discharge.  Patient was seen and followed by GI during the hospitalization.  Patient status post upper endoscopy on 02/08/2019 which showed nummular lesions in the esophageal mucosa which were biopsied, LA grade A esophagitis.  Dilatation of entire esophagus.  Evidence of previous  gastric surgery found characterized by inflammation and erythema which was biopsied.  Nonbleeding gastric ulcer noted with clean ulcer base which was biopsied.  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  Gastrojejunostomy found characterized by ulceration, intact staple line and visible sutures.  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  Staples and sutures removed.  Gastrojejunostomy found with healthy-appearing mucosa which was traversed.  Normal mucosa found in jejunum which was biopsied.  Could not reach enteroenterostomy.  Patient currently with acute infection and as such IV iron infusion was not done.  Outpatient follow-up with GI.   6.  Dysphagia/epigastric pain/nausea and vomiting/nonbleeding gastric ulcer, gastritis, esophagitis per EGD 02/08/2019 Patient status post Roux-en-Y gastric bypass in 2012.    Patient placed on Protonix 40 mg IV every 12 hours throughout the hospitalization.  GI was consulted.  Patient was seen by GI and patient underwent upper endoscopy on 02/08/2019 which showed nummular lesions in the esophageal mucosa which were biopsied, LA grade A esophagitis.  Dilatation of entire esophagus.  Evidence of previous gastric surgery found characterized by inflammation and erythema which was biopsied.  Nonbleeding gastric ulcer noted with clean ulcer base which was biopsied.  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  Gastrojejunostomy found characterized by ulceration, intact staple line and visible sutures.  Blind and  suspected as it could not be traversed.  Staples and sutures removed.  Gastrojejunostomy found with healthy-appearing mucosa which was traversed.  Normal mucosa found in jejunum which was biopsied.  Could not reach enteroenterostomy.  Patient improved clinically will be discharged home on Protonix 40 mg twice daily x3 months.  Patient will need follow-up with GI for probable repeat upper endoscopy in 3 months for surveillance healing.  Patient also  may benefit from formal upper GI series with small bowel follow-through to follow-up on fistula.  Outpatient follow-up with GI.   7.  Acute on chronic low back pain Patient noted to have intractable pain which was not improved with home Neurontin.  Likely felt secondary to acute pyelonephritis contributing.  Pain improved during the hospitalization.  Patient maintained on IV antibiotics.  Patient be discharged home on 7 more days of oral Levaquin to complete a course of antibiotic treatment.  Patient also be discharged on Ultram as needed.    8.  Depression/anxiety Stable.  Patient maintained on home regimen of Elavil.  Outpatient follow-up.   9.  Acute renal failure Creatinine peaked at 2.03.  Felt secondary to dehydration as patient noted to be hypotensive on admission.  Improved with hydration.  IV fluids were subsequently saline locked as patient noted to be in pulmonary edema.  Patient was diuresed.   Acute renal failure had resolved by day of discharge.  Creatinine was down to 0.80.    Procedures:  CT abdomen and pelvis 02/02/2019, 02/08/2019  Chest x-ray 02/02/2019, 02/05/2019, 02/07/2019  Upper endoscopy per Dr. Rush [name] 02/08/2019  2 units packed red blood cells 02/09/2019  2D echo 02/09/2019   Consultations:  Gastroenterology: Dr. Rush [name] 02/05/2019  ID: Dr. Megan Salon 02/09/2019   Discharge Exam: Vitals:   02/11/19 0459 02/11/19 1420  BP: 120/89 (!) 127/93  Pulse: 86 (!) 107  Resp: 18 18  Temp: 98.1 F (36.7 C) 99.3 F (37.4 C)  SpO2: 90% 96%    General: NAD Cardiovascular: RRR Respiratory: CTAB  Discharge Instructions   Discharge Instructions    Diet - low sodium heart healthy   Complete by: As directed    Increase activity slowly   Complete by: As directed      Allergies as of 02/11/2019      Reactions   Ketorolac Tromethamine Itching, Nausea And Vomiting   Methocarbamol Diarrhea      Medication List    STOP taking these medications    amoxicillin 500 MG capsule Commonly known as: AMOXIL   ibuprofen 200 MG tablet Commonly known as: ADVIL   oxyCODONE-acetaminophen 5-325 MG tablet Commonly known as: Percocet     TAKE these medications   acetaminophen 500 MG tablet Commonly known as: TYLENOL Take 1 tablet (500 mg total) by mouth every 6 (six) hours as needed for moderate pain.   albuterol 108 (90 Base) MCG/ACT inhaler Commonly known as: VENTOLIN HFA Inhale 1-2 puffs into the lungs every 4 (four) hours as needed for wheezing or shortness of breath.   amitriptyline 75 MG tablet Commonly known as: ELAVIL Take 75 mg by mouth at bedtime.   gabapentin 300 MG capsule Commonly known as: NEURONTIN Take 300 mg by mouth 3 (three) times daily.   levofloxacin 750 MG tablet Commonly known as: Levaquin Take 1 tablet (750 mg total) by mouth daily for 7 days.   lisinopril-hydrochlorothiazide 10-12.5 MG tablet Commonly known as: ZESTORETIC Take 1 tablet by mouth daily.   pantoprazole 40 MG tablet Commonly known as: Protonix Take 1  tablet (40 mg total) by mouth 2 (two) times daily before a meal. What changed: when to take this   polyethylene glycol 17 g packet Commonly known as: MiraLax Take 17 g by mouth daily.   senna 8.6 MG Tabs tablet Commonly known as: SENOKOT Take 1 tablet (8.6 mg total) by mouth daily.   traMADol 50 MG tablet Commonly known as: ULTRAM Take 1 tablet (50 mg total) by mouth every 6 (six) hours as needed.      Allergies  Allergen Reactions  . Ketorolac Tromethamine Itching and Nausea And Vomiting  . Methocarbamol Diarrhea   Follow-up Information    Terri Ebbs, MD. Schedule an appointment as soon as possible for a visit in 2 week(s).   Specialty: Internal Medicine Contact information: [place] [address] [phone]        Mansouraty, Telford Nab., MD. Schedule an appointment as soon as possible for a visit in 3 week(s).   Specialties: Gastroenterology,  Internal Medicine Why: f/u in 3-4 weeks. Contact information: [place] [address] [phone]            The results of significant diagnostics from this hospitalization (including imaging, microbiology, ancillary and laboratory) are listed below for reference.    Significant Diagnostic Studies: Ct Abdomen Pelvis Wo Contrast  Result Date: 02/02/2019 CLINICAL DATA:  Shortness of breath, cough and body aches for 3 days, septic with abdominal and back pain EXAM: CT ABDOMEN AND PELVIS WITHOUT CONTRAST TECHNIQUE: Multidetector CT imaging of the abdomen and pelvis was performed following the standard protocol without IV contrast. COMPARISON:  CT 04/06/2018 FINDINGS: Lower chest: Bandlike areas of basilar atelectasis and/or scarring. Normal heart size. No pericardial effusion. Hepatobiliary: No focal liver abnormality is seen. Patient is post cholecystectomy. Slight prominence of the biliary tree likely related to reservoir effect. No calcified intraductal gallstones. Pancreas: Unremarkable. No pancreatic ductal dilatation or surrounding inflammatory changes. Spleen: Normal in size without focal abnormality. Adrenals/Urinary Tract: Normal adrenal glands. There is bilateral renal enlargement with a heterogeneous attenuation and significant surrounding stranding. No hydronephrosis or obstructive urolithiasis. No visible or contour deforming renal lesions. Urinary bladder is circumferentially thickened. Stomach/Bowel: Patient is post gastric bypass with continued decrease in the amount of material within the excluded portion of the gastric pouch. There is focal dilatation of a small bowel loop adjacent the distal small bowel anastomosis up to 4.3 cm however the adjacent bowel loops are more normal caliber and there is passage of material beyond the level of the anastomosis. No other small bowel dilatation or mural thickening. A normal appendix is visualized. No colonic dilatation or wall  thickening. Vascular/Lymphatic: Atherosclerotic plaque within the normal caliber aorta. Further evaluation the vessels is limited by absence of contrast. No suspicious or enlarged lymph nodes in the included lymphatic chains. Reproductive: Normal appearance of the uterus and adnexal structures. Bilateral ligation clips are noted. Other: Extensive perinephric stranding, as above. No free fluid or free gas. No bowel containing hernia. Postsurgical changes are seen in the upper anterior midline abdomen. Musculoskeletal: Multilevel degenerative changes are present in the imaged portions of the spine. Features are most pronounced at L3-4 and L4-5 levels. Postsurgical changes from prior posterior spinal decompression. Partial fragmentation of the L3 spinous process. IMPRESSION: 1. Bilateral renal enlargement with a heterogeneous attenuation and significant surrounding stranding with circumferentially thickened bladder wall suggesting an ascending urinary tract infection with pyelonephritis. 2. Focal dilatation of a small bowel loop adjacent the distal small bowel anastomosis however  the adjacent bowel loops are more normal caliber and there is passage of material beyond the level of the anastomosis. Findings could reflect normal postsurgical denervation of the bowel loop, focal ileus or partial obstruction. Correlation with symptoms is recommended. 3. Aortic Atherosclerosis (ICD10-I70.0). Electronically Signed   By: Lovena Le M.D.   On: 02/02/2019 20:04   Dg Chest 2 View  Result Date: 02/07/2019 CLINICAL DATA:  Abdominal pain,, fever, urosepsis EXAM: CHEST - 2 VIEW COMPARISON:  02/05/2019 FINDINGS: No significant interval change in examination with generally bandlike areas of atelectasis in the left lung, diffuse bilateral interstitial pulmonary opacity most consistent with edema, and small bilateral pleural effusions. There is no new focal airspace opacity. IMPRESSION: No significant interval change in examination  with generally bandlike areas of atelectasis in the left lung, diffuse bilateral interstitial pulmonary opacity most consistent with edema, and small bilateral pleural effusions. There is no new focal airspace opacity. Electronically Signed   By: Eddie Candle M.D.   On: 02/07/2019 15:55   Dg Chest 2 View  Result Date: 02/05/2019 CLINICAL DATA:  43 year old female with a history of shortness of breath EXAM: CHEST - 2 VIEW COMPARISON:  February 02, 2019 FINDINGS: Cardiomediastinal silhouette unchanged in size and contour. New interlobular septal thickening compared to the prior with linear opacities of the bilateral lungs and thickening of the minor fissure. No pneumothorax. No pleural effusion. No confluent airspace disease. IMPRESSION: Evidence of early pulmonary edema. Electronically Signed   By: Corrie Mckusick D.O.   On: 02/05/2019 16:22   Ct Abdomen Pelvis W Contrast  Result Date: 02/08/2019 CLINICAL DATA:  Fever and worsening elevated white blood cell count EXAM: CT ABDOMEN AND PELVIS WITH CONTRAST TECHNIQUE: Multidetector CT imaging of the abdomen and pelvis was performed using the standard protocol following bolus administration of intravenous contrast. CONTRAST:  100 mL Omnipaque 300 COMPARISON:  02/02/2019, 04/02/2018, 09/13/2017 FINDINGS: Lower chest: Bilateral pleural effusions are noted right greater than left with associated bibasilar atelectatic changes. No focal confluent infiltrate is seen. No parenchymal nodules are noted. Hepatobiliary: No focal liver abnormality is seen. Status post cholecystectomy. No biliary dilatation. Pancreas: Unremarkable. No pancreatic ductal dilatation or surrounding inflammatory changes. Spleen: Normal in size without focal abnormality. Adrenals/Urinary Tract: Adrenal glands are within normal limits bilaterally. Kidneys demonstrate fullness of the collecting systems with irregular enhancement pattern similar to the density difference is noted on the prior exam  consistent with pyelonephritis. Delayed images demonstrate mild excretion of contrast material. No definitive calculi are seen. The bladder is well distended. There is an enhancing nodule identified in the posterior aspect of the bladder just to the right of the midline similar to that seen on the prior exam from 2019. given its stability this is likely benign in etiology. Stomach/Bowel: Colon demonstrates scattered fecal material throughout. No obstructive changes are seen. The appendix is air-filled and within normal limits. No small bowel abnormality is seen. Postsurgical changes consistent with gastric bypass are noted. Vascular/Lymphatic: Aortic atherosclerosis. No enlarged abdominal or pelvic lymph nodes. Reproductive: Uterus and bilateral adnexa are unremarkable. Other: No abdominal wall hernia or abnormality. No abdominopelvic ascites. Musculoskeletal: Mild degenerative changes of lumbar spine are noted. IMPRESSION: Changes consistent with bilateral pyelonephritis better delineated on the current exam due to the administration of contrast material. Slight increase in fullness of the renal collecting systems is noted when compared with the prior study although no definitive obstructing lesions are seen. No abscess is identified. Bilateral pleural effusions right greater than left with  associated atelectatic changes. Stable enhancing nodule along the posterior aspect of the bladder just to the right of the midline unchanged from 2019. Electronically Signed   By: Inez Catalina M.D.   On: 02/08/2019 22:41   Dg Chest Port 1 View  Result Date: 02/02/2019 CLINICAL DATA:  43 year old female with shortness of breath and cough. EXAM: PORTABLE CHEST 1 VIEW COMPARISON:  Chest radiograph dated 09/13/2017 FINDINGS: The lungs are clear. There is no pleural effusion or pneumothorax. Stable cardiac silhouette. No acute osseous pathology. The IMPRESSION: No active disease. Electronically Signed   By: Anner Crete M.D.    On: 02/02/2019 16:16    Microbiology: Recent Results (from the past 240 hour(s))  SARS Coronavirus 2 [facility] order, Performed in [facility] hospital lab) Nasopharyngeal Nasopharyngeal Swab     Status: None   Collection Time: 02/02/19  3:10 PM   Specimen: Nasopharyngeal Swab  Result Value Ref Range Status   SARS Coronavirus 2 NEGATIVE NEGATIVE Final    Comment: (NOTE) If result is NEGATIVE SARS-CoV-2 target nucleic acids are NOT DETECTED. The SARS-CoV-2 RNA is generally detectable in upper and lower  respiratory specimens during the acute phase of infection. The lowest  concentration of SARS-CoV-2 viral copies this assay can detect is 250  copies / mL. A negative result does not preclude SARS-CoV-2 infection  and should not be used as the sole basis for treatment or other  patient management decisions.  A negative result may occur with  improper specimen collection / handling, submission of specimen other  than nasopharyngeal swab, presence of viral mutation(s) within the  areas targeted by this assay, and inadequate number of viral copies  (<250 copies / mL). A negative result must be combined with clinical  observations, patient history, and epidemiological information. If result is POSITIVE SARS-CoV-2 target nucleic acids are DETECTED. The SARS-CoV-2 RNA is generally detectable in upper and lower  respiratory specimens dur ing the acute phase of infection.  Positive  results are indicative of active infection with SARS-CoV-2.  Clinical  correlation with patient history and other diagnostic information is  necessary to determine patient infection status.  Positive results do  not rule out bacterial infection or co-infection with other viruses. If result is PRESUMPTIVE POSTIVE SARS-CoV-2 nucleic acids MAY BE PRESENT.   A presumptive positive result was obtained on the submitted specimen  and confirmed on repeat testing.  While 2019 novel coronavirus  (SARS-CoV-2) nucleic  acids may be present in the submitted sample  additional confirmatory testing may be necessary for epidemiological  and / or clinical management purposes  to differentiate between  SARS-CoV-2 and other Sarbecovirus currently known to infect humans.  If clinically indicated additional testing with an alternate test  methodology [phone]) is advised. The SARS-CoV-2 RNA is generally  detectable in upper and lower respiratory sp ecimens during the acute  phase of infection. The expected result is Negative. Fact Sheet for Patients:  StrictlyIdeas.no Fact Sheet for Healthcare Providers: BankingDealers.co.za This test is not yet approved or cleared by the [place] FDA and has been authorized for detection and/or diagnosis of SARS-CoV-2 by FDA under an Emergency Use Authorization (EUA).  This EUA will remain in effect (meaning this test can be used) for the duration of the COVID-19 declaration under Section 564(b)(1) of the Act, 21 U.S.C. section 360bbb-3(b)(1), unless the authorization is terminated or revoked sooner. Performed at [facility], [place] [address], [place], [place] [address]   Urine Culture  Status: Abnormal   Collection Time: 02/02/19  3:10 PM   Specimen: Urine, Clean Catch  Result Value Ref Range Status   Specimen Description   Final    URINE, CLEAN CATCH Performed at [facility] [place] [address] [place], [place] [address]    Special Requests   Final    NONE Performed at [facility], [place] [address] [place], [place] [address]    Culture 30,000 COLONIES/mL ESCHERICHIA COLI (A)  Final   Report Status 02/05/2019 FINAL  Final   Organism ID, Bacteria ESCHERICHIA COLI (A)  Final      Susceptibility   Escherichia coli - MIC*    AMPICILLIN <=2 SENSITIVE Sensitive     CEFAZOLIN <=4 SENSITIVE Sensitive     CEFTRIAXONE <=1 SENSITIVE Sensitive     CIPROFLOXACIN <=0.25  SENSITIVE Sensitive     GENTAMICIN <=1 SENSITIVE Sensitive     IMIPENEM <=0.25 SENSITIVE Sensitive     NITROFURANTOIN <=16 SENSITIVE Sensitive     TRIMETH/SULFA <=20 SENSITIVE Sensitive     AMPICILLIN/SULBACTAM <=2 SENSITIVE Sensitive     PIP/TAZO <=4 SENSITIVE Sensitive     Extended ESBL NEGATIVE Sensitive     * 30,000 COLONIES/mL ESCHERICHIA COLI  Blood Culture (routine x 2)     Status: Abnormal   Collection Time: 02/02/19  4:53 PM   Specimen: BLOOD LEFT FOREARM  Result Value Ref Range Status   Specimen Description   Final    BLOOD LEFT FOREARM Performed at [facility] [address], [place], [place] [address]    Special Requests   Final    BOTTLES DRAWN AEROBIC AND ANAEROBIC Blood Culture adequate volume Performed at [place] [address], [place], [place] [address]    Culture  Setup Time   Final    GRAM NEGATIVE RODS IN BOTH AEROBIC AND ANAEROBIC BOTTLES CRITICAL RESULT CALLED TO, READ BACK BY AND VERIFIED WITH: PHARMD M LILLISTONE [id number] AT 1021 AM BY CM Performed at [facility], [place] [address], [place], [place] [address]    Culture ESCHERICHIA COLI (A)  Final   Report Status 02/05/2019 FINAL  Final   Organism ID, Bacteria ESCHERICHIA COLI  Final      Susceptibility   Escherichia coli - MIC*    AMPICILLIN <=2 SENSITIVE Sensitive     CEFAZOLIN <=4 SENSITIVE Sensitive     CEFEPIME <=1 SENSITIVE Sensitive     CEFTAZIDIME <=1 SENSITIVE Sensitive     CEFTRIAXONE <=1 SENSITIVE Sensitive     CIPROFLOXACIN <=0.25 SENSITIVE Sensitive     GENTAMICIN <=1 SENSITIVE Sensitive     IMIPENEM <=0.25 SENSITIVE Sensitive     TRIMETH/SULFA <=20 SENSITIVE Sensitive     AMPICILLIN/SULBACTAM <=2 SENSITIVE Sensitive     PIP/TAZO <=4 SENSITIVE Sensitive     Extended ESBL NEGATIVE Sensitive     * ESCHERICHIA COLI  Blood Culture ID Panel (Reflexed)     Status: Abnormal   Collection Time: 02/02/19  4:53 PM  Result Value Ref Range Status    Enterococcus species NOT DETECTED NOT DETECTED Final   Listeria monocytogenes NOT DETECTED NOT DETECTED Final   Staphylococcus species NOT DETECTED NOT DETECTED Final   Staphylococcus aureus (BCID) NOT DETECTED NOT DETECTED Final   Streptococcus species NOT DETECTED NOT DETECTED Final   Streptococcus agalactiae NOT DETECTED NOT DETECTED Final   Streptococcus pneumoniae NOT DETECTED NOT DETECTED Final   Streptococcus pyogenes NOT DETECTED NOT DETECTED Final   Acinetobacter baumannii  NOT DETECTED NOT DETECTED Final   Enterobacteriaceae species DETECTED (A) NOT DETECTED Final    Comment: Enterobacteriaceae represent a large family of gram-negative bacteria, not a single organism. CRITICAL RESULT CALLED TO, READ BACK BY AND VERIFIED WITH: PHARMD M LILLISTONE [id number] AT 1022 BY CM    Enterobacter cloacae complex NOT DETECTED NOT DETECTED Final   Escherichia coli DETECTED (A) NOT DETECTED Final    Comment: CRITICAL RESULT CALLED TO, READ BACK BY AND VERIFIED WITH: PHARMD M LILLISTONE [id number] AT 66 AM BY CM    Klebsiella oxytoca NOT DETECTED NOT DETECTED Final   Klebsiella pneumoniae NOT DETECTED NOT DETECTED Final   Proteus species NOT DETECTED NOT DETECTED Final   Serratia marcescens NOT DETECTED NOT DETECTED Final   Carbapenem resistance NOT DETECTED NOT DETECTED Final   Haemophilus influenzae NOT DETECTED NOT DETECTED Final   Neisseria meningitidis NOT DETECTED NOT DETECTED Final   Pseudomonas aeruginosa NOT DETECTED NOT DETECTED Final   Candida albicans NOT DETECTED NOT DETECTED Final   Candida glabrata NOT DETECTED NOT DETECTED Final   Candida krusei NOT DETECTED NOT DETECTED Final   Candida parapsilosis NOT DETECTED NOT DETECTED Final   Candida tropicalis NOT DETECTED NOT DETECTED Final    Comment: Performed at [facility], [place] [address], [place], [place] [address]  Blood Culture (routine x 2)     Status: Abnormal   Collection Time: 02/02/19  5:53 PM   Specimen: BLOOD   Result Value Ref Range Status   Specimen Description   Final    BLOOD RIGHT ANTECUBITAL Performed at [facility] [address], [place], [place] [address]    Special Requests   Final    BOTTLES DRAWN AEROBIC AND ANAEROBIC Blood Culture results may not be optimal due to an excessive volume of blood received in culture bottles Performed at [facility] [address] [place], [place] [address]    Culture  Setup Time   Final    GRAM NEGATIVE RODS IN BOTH AEROBIC AND ANAEROBIC BOTTLES CRITICAL RESULT CALLED TO, READ BACK BY AND VERIFIED WITH: PHARMD M LILLISTONE [id number] AT 40 AM BY CM    Culture (A)  Final    ESCHERICHIA COLI SUSCEPTIBILITIES PERFORMED ON PREVIOUS CULTURE WITHIN THE LAST 5 DAYS. Performed at [facility], [place] [address] [place], [place] [address]    Report Status 02/05/2019 FINAL  Final  Culture, blood (Routine X 2) w Reflex to ID Panel     Status: None (Preliminary result)   Collection Time: 02/07/19 10:40 AM   Specimen: BLOOD  Result Value Ref Range Status   Specimen Description   Final    BLOOD RIGHT ARM Performed at [facility] [address], [place], [place] [address]    Special Requests   Final    BOTTLES DRAWN AEROBIC ONLY Blood Culture adequate volume Performed at [facility] [address] [place], [place] [address]    Culture   Final    NO GROWTH 3 DAYS Performed at [facility], [place] [address] [place], [place] [address]    Report Status PENDING  Incomplete  Culture, blood (Routine X 2) w Reflex to ID Panel     Status: None (Preliminary result)   Collection Time: 02/07/19 10:45 AM   Specimen: BLOOD LEFT HAND  Result Value Ref Range Status   Specimen Description   Final    BLOOD LEFT HAND Performed at [facility], [place] [address], [place], [place] [address]    Special Requests   Final    BOTTLES DRAWN AEROBIC ONLY Blood Culture  results may not be optimal due to an inadequate volume of blood received in culture bottles Performed at [facility] [address], [place], [place] [address]    Culture   Final    NO GROWTH 3 DAYS Performed at [facility], [place] [address], [place], [place] [address]    Report Status PENDING  Incomplete  Culture, Urine     Status: Abnormal   Collection Time: 02/10/19 10:26 AM   Specimen: Urine, Clean Catch  Result Value Ref Range Status   Specimen Description   Final    URINE, CLEAN CATCH Performed at [facility], [place] [address], [place], [place] [address]    Special Requests   Final    NONE Performed at [facility], [place] [address], [place], [place] [address]    Culture (A)  Final    <10,000 COLONIES/mL INSIGNIFICANT GROWTH Performed at [facility] [address] [place], [place] [address]    Report Status 02/11/2019 FINAL  Final     Labs: Basic Metabolic Panel: Recent Labs  Lab 02/07/19 0523 02/08/19 0458 02/09/19 0451 02/10/19 0343 02/11/19 0932  NA 138 139 140 137 139  K 2.5* 3.5 3.1* 3.7 5.1  CL 104 102 101 96* 99  CO2 25 27 29 31 28   GLUCOSE 82 86 90 101* 100*  BUN 28* 21* 13 10 10   CREATININE 1.35* 1.02* 0.88 0.74 0.80  CALCIUM 8.0* 7.8* 7.6* 7.8* 9.0  MG 1.5* 1.9 1.8 2.2  --    Liver Function Tests: Recent Labs  Lab 02/05/19 0610 02/07/19 0523  AST 23 16  ALT 16 6  ALKPHOS 158* 147*  BILITOT 1.1 0.8  PROT 4.6* 4.5*  ALBUMIN 1.7* 1.6*   No results for input(s): LIPASE, AMYLASE in the last 168 hours. No results for input(s): AMMONIA in the last 168 hours. CBC: Recent Labs  Lab 02/07/19 0523 02/08/19 0458 02/09/19 0451 02/09/19 2227 02/10/19 0343 02/11/19 0932  WBC 16.7* 19.7* 14.1*  --  15.4* 14.3*  NEUTROABS  --  16.4* 11.5*  --  12.5* 11.3*  HGB 7.3* 8.3* 6.7* 8.9* 9.0* 11.2*  HCT 21.7* 25.6* 20.2* 26.7* 27.3* 35.6*  MCV 65.0* 66.7* 65.8*  --  70.7* 74.0*  PLT  110* 148* 194  --  252 465*   Cardiac Enzymes: No results for input(s): CKTOTAL, CKMB, CKMBINDEX, TROPONINI in the last 168 hours. BNP: BNP (last 3 results) No results for input(s): BNP in the last 8760 hours.  ProBNP (last 3 results) No results for input(s): PROBNP in the last 8760 hours.  CBG: Recent Labs  Lab 02/10/19 2115 02/10/19 2353 02/11/19 0340 02/11/19 0745 02/11/19 1158  GLUCAP 109* 82 91 92 111*       Signed:  Irine Seal MD.  [facility] 02/11/2019, 2:37 PM

## 2019-02-12 LAB — CULTURE, BLOOD (ROUTINE X 2)
Culture: NO GROWTH
Culture: NO GROWTH
Special Requests: ADEQUATE

## 2019-02-13 ENCOUNTER — Encounter: Payer: Self-pay | Admitting: Gastroenterology

## 2019-04-17 ENCOUNTER — Encounter: Payer: Self-pay | Admitting: Gastroenterology

## 2019-04-18 ENCOUNTER — Other Ambulatory Visit: Payer: Self-pay

## 2019-04-18 ENCOUNTER — Emergency Department (HOSPITAL_COMMUNITY)
Admission: EM | Admit: 2019-04-18 | Discharge: 2019-04-18 | Disposition: A | Discharge status: Home / Self Care | Payer: Medicare Other | Attending: Emergency Medicine | Admitting: Emergency Medicine

## 2019-04-18 ENCOUNTER — Encounter (HOSPITAL_COMMUNITY): Payer: Self-pay

## 2019-04-18 DIAGNOSIS — R1110 Vomiting, unspecified: Secondary | ICD-10-CM | POA: Insufficient documentation

## 2019-04-18 DIAGNOSIS — Z5321 Procedure and treatment not carried out due to patient leaving prior to being seen by health care provider: Secondary | ICD-10-CM | POA: Diagnosis not present

## 2019-04-18 LAB — COMPREHENSIVE METABOLIC PANEL
ALT: 10 U/L (ref 0–44)
AST: 16 U/L (ref 15–41)
Albumin: 3.5 g/dL (ref 3.5–5.0)
Alkaline Phosphatase: 107 U/L (ref 38–126)
Anion gap: 6 (ref 5–15)
BUN: 15 mg/dL (ref 6–20)
CO2: 28 mmol/L (ref 22–32)
Calcium: 9 mg/dL (ref 8.9–10.3)
Chloride: 105 mmol/L (ref 98–111)
Creatinine, Ser: 0.91 mg/dL (ref 0.44–1.00)
GFR calc Af Amer: >60 mL/min (ref >60)
GFR calc non Af Amer: >60 mL/min (ref >60)
Glucose, Bld: 99 mg/dL (ref 70–99)
Potassium: 4.4 mmol/L (ref 3.5–5.1)
Sodium: 139 mmol/L (ref 135–145)
Total Bilirubin: 0.8 mg/dL (ref 0.3–1.2)
Total Protein: 6.7 g/dL (ref 6.5–8.1)

## 2019-04-18 LAB — CBC
HCT: 42 % (ref 36.0–46.0)
Hemoglobin: 14.1 g/dL (ref 12.0–15.0)
MCH: 27.2 pg (ref 26.0–34.0)
MCHC: 33.6 g/dL (ref 30.0–36.0)
MCV: 81.1 fL (ref 80.0–100.0)
Platelets: 302 10*3/uL (ref 150–400)
RBC: 5.18 MIL/uL — ABNORMAL HIGH (ref 3.87–5.11)
RDW: 19.9 % — ABNORMAL HIGH (ref 11.5–15.5)
WBC: 7.5 10*3/uL (ref 4.0–10.5)
nRBC: 0 % (ref 0.0–0.2)

## 2019-04-18 LAB — LIPASE, BLOOD: Lipase: 30 U/L (ref 11–51)

## 2019-04-18 MED ORDER — SODIUM CHLORIDE 0.9% FLUSH: 3.0000 mL | Freq: Once | INTRAVENOUS | Status: DC

## 2019-04-18 NOTE — ED Triage Notes (Addendum)
Pt coming in c/o emesis and body aches x2 weeks. Pt has not been able to keep anything down. C/o of 100.0 fever at home. No known sick contacts

## 2019-04-18 NOTE — ED Notes (Signed)
Pt told staff that "this is taking too long. I want to leave" Pt called a ride. Alert and ambulatory. No acute distress.

## 2019-04-18 NOTE — ED Notes (Signed)
Pt reports she does not take her blood pressure medication like she is suppose to.

## 2019-04-25 ENCOUNTER — Emergency Department (HOSPITAL_COMMUNITY)
Admission: EM | Admit: 2019-04-25 | Discharge: 2019-04-25 | Disposition: A | Discharge status: Home / Self Care | Payer: Medicare Other | Attending: Emergency Medicine | Admitting: Emergency Medicine

## 2019-04-25 ENCOUNTER — Encounter (HOSPITAL_COMMUNITY): Payer: Self-pay

## 2019-04-25 ENCOUNTER — Other Ambulatory Visit: Payer: Self-pay

## 2019-04-25 ENCOUNTER — Emergency Department (HOSPITAL_COMMUNITY): Payer: Medicare Other

## 2019-04-25 DIAGNOSIS — M25559 Pain in unspecified hip: Secondary | ICD-10-CM | POA: Insufficient documentation

## 2019-04-25 DIAGNOSIS — Z5321 Procedure and treatment not carried out due to patient leaving prior to being seen by health care provider: Secondary | ICD-10-CM | POA: Diagnosis not present

## 2019-04-25 NOTE — ED Notes (Signed)
Pt came out of her room on the rolling stool from room. Staff asked pt if we could help with anything. Pt stated she needed to use the restroom. Pt began scooting herself down the hall on the stool. Eugene Garnet, RN asked pt "mam, what can I do to assist you?" Pt stated "nothing, get away from me." Eugene Garnet, RN stated "mam, let me get you a wheelchair" Pt continued to refuse staff to let them assist her. Pt then placed herself on the floor and looked at staff and said "well if you were paying attention to me this wouldn't have happened." Pt began screaming and cursing at staff saying "call my boyfriend. I wanna leave." Pt put herself without assistance into wheelchair. Security standing by.

## 2019-04-25 NOTE — ED Notes (Signed)
This nurse observed patient wheel herself on the stool from the room yelling that she needs to go to the bathroom. RN Eugene Garnet approached the patient and stated "Ma'am what can I do to assist you?" Patient yelling and refusing assistance. RN Eugene Garnet asked patient if she could get her a wheelchair but patient refused stating to leave her alone. Patient then found sitting on floor stating "See you did not help me, you were not paying attention and now look where I am" Patient shoved stool at this nurse and RN McRoberts. Patient continues to refuse any assistance to get up, drags herself across th;dye floor, and lifts herself into a wheelchair with no assistance from staff. Patient yells "Call my boyfriend, I am leaving." Patient was encouraged to stay and be seen but patient refused.

## 2019-04-25 NOTE — ED Triage Notes (Signed)
Patient arrived via [facility] after she sat on her bed and when she turned to lay down she heard a "pop" No fall, no shortening or rotation, pulses present. 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

## 2019-04-25 NOTE — ED Notes (Addendum)
Patient rolled herself out of the room on the stool. This RN asked patient to let RN bring her a wheelchair. Pt refused and rolled away on the stool towards the bathroom. This RN asked patient if she could assist her in the bathroom. Patient cursed at RN and said get away from me. Patient then screamed from the floor in front of the bathroom. Patient continued to curse at staff and would not let staff assist her. Security called for assistance, patient still cursing and screaming at staff while sitting on the floor. Pt screamed at staff "well if you were paying attention to me this would not have happened". Patient then put herself, without assistance, into a wheelchair. Patient stated she is leaving. Patient demands staff roll her outside for her husband to take her home. Mortimer Fries, RN rolled patient out using wheelchair. Patient leaving AMA. Dr. Tyrone Nine aware of situation and patient leaving AMA.

## 2019-06-23 ENCOUNTER — Other Ambulatory Visit: Payer: Self-pay

## 2019-06-23 ENCOUNTER — Encounter (HOSPITAL_COMMUNITY): Payer: Self-pay

## 2019-06-23 ENCOUNTER — Emergency Department (HOSPITAL_COMMUNITY)
Admission: EM | Admit: 2019-06-23 | Discharge: 2019-06-23 | Disposition: A | Discharge status: Home / Self Care | Payer: Medicare Other | Attending: Emergency Medicine | Admitting: Emergency Medicine

## 2019-06-23 ENCOUNTER — Emergency Department (HOSPITAL_COMMUNITY): Payer: Medicare Other

## 2019-06-23 DIAGNOSIS — I10 Essential (primary) hypertension: Secondary | ICD-10-CM | POA: Diagnosis not present

## 2019-06-23 DIAGNOSIS — Z79899 Other long term (current) drug therapy: Secondary | ICD-10-CM | POA: Diagnosis not present

## 2019-06-23 DIAGNOSIS — F17210 Nicotine dependence, cigarettes, uncomplicated: Secondary | ICD-10-CM | POA: Diagnosis not present

## 2019-06-23 DIAGNOSIS — M79671 Pain in right foot: Secondary | ICD-10-CM | POA: Diagnosis present

## 2019-06-23 DIAGNOSIS — L089 Local infection of the skin and subcutaneous tissue, unspecified: Secondary | ICD-10-CM | POA: Diagnosis not present

## 2019-06-23 LAB — COMPREHENSIVE METABOLIC PANEL
ALT: 10 U/L (ref 0–44)
AST: 15 U/L (ref 15–41)
Albumin: 3.9 g/dL (ref 3.5–5.0)
Alkaline Phosphatase: 90 U/L (ref 38–126)
Anion gap: 9 (ref 5–15)
BUN: 14 mg/dL (ref 6–20)
CO2: 29 mmol/L (ref 22–32)
Calcium: 9.2 mg/dL (ref 8.9–10.3)
Chloride: 100 mmol/L (ref 98–111)
Creatinine, Ser: 0.94 mg/dL (ref 0.44–1.00)
GFR calc Af Amer: >60 mL/min (ref >60)
GFR calc non Af Amer: >60 mL/min (ref >60)
Glucose, Bld: 115 mg/dL — ABNORMAL HIGH (ref 70–99)
Potassium: 2.6 mmol/L — CL (ref 3.5–5.1)
Sodium: 138 mmol/L (ref 135–145)
Total Bilirubin: 0.9 mg/dL (ref 0.3–1.2)
Total Protein: 7.1 g/dL (ref 6.5–8.1)

## 2019-06-23 LAB — CBC WITH DIFFERENTIAL/PLATELET
Abs Immature Granulocytes: 0.02 10*3/uL (ref 0.00–0.07)
Basophils Absolute: 0.1 10*3/uL (ref 0.0–0.1)
Basophils Relative: 1 %
Eosinophils Absolute: 0.1 10*3/uL (ref 0.0–0.5)
Eosinophils Relative: 1 %
HCT: 35.1 % — ABNORMAL LOW (ref 36.0–46.0)
Hemoglobin: 12.2 g/dL (ref 12.0–15.0)
Immature Granulocytes: 0 %
Lymphocytes Relative: 15 %
Lymphs Abs: 1.6 10*3/uL (ref 0.7–4.0)
MCH: 28.6 pg (ref 26.0–34.0)
MCHC: 34.8 g/dL (ref 30.0–36.0)
MCV: 82.2 fL (ref 80.0–100.0)
Monocytes Absolute: 0.8 10*3/uL (ref 0.1–1.0)
Monocytes Relative: 7 %
Neutro Abs: 8.1 10*3/uL — ABNORMAL HIGH (ref 1.7–7.7)
Neutrophils Relative %: 76 %
Platelets: 241 10*3/uL (ref 150–400)
RBC: 4.27 MIL/uL (ref 3.87–5.11)
RDW: 17 % — ABNORMAL HIGH (ref 11.5–15.5)
WBC: 10.7 10*3/uL — ABNORMAL HIGH (ref 4.0–10.5)
nRBC: 0 % (ref 0.0–0.2)

## 2019-06-23 LAB — LACTIC ACID, PLASMA: Lactic Acid, Venous: 1 mmol/L (ref 0.5–1.9)

## 2019-06-23 MED ORDER — SODIUM CHLORIDE 0.9 % IV BOLUS
1000.0000 mL | Freq: Once | INTRAVENOUS | Status: AC
  Administered 2019-06-23: 1000 mL via INTRAVENOUS

## 2019-06-23 MED ORDER — POTASSIUM CHLORIDE CRYS ER 20 MEQ PO TBCR: 20.0000 meq | EXTENDED_RELEASE_TABLET | Freq: Two times a day (BID) | ORAL | 0 refills | Status: DC

## 2019-06-23 MED ORDER — AMOXICILLIN-POT CLAVULANATE 875-125 MG PO TABS
1.0000 | ORAL_TABLET | Freq: Once | ORAL | Status: AC
  Administered 2019-06-23: 1 via ORAL
  Filled 2019-06-23: qty 1

## 2019-06-23 MED ORDER — FENTANYL CITRATE (PF) 100 MCG/2ML IJ SOLN
100.0000 ug | INTRAMUSCULAR | Status: DC | PRN
  Administered 2019-06-23 (×2): 100 ug via INTRAVENOUS
  Filled 2019-06-23 (×2): qty 2

## 2019-06-23 MED ORDER — POTASSIUM CHLORIDE CRYS ER 20 MEQ PO TBCR
40.0000 meq | EXTENDED_RELEASE_TABLET | Freq: Once | ORAL | Status: AC
  Administered 2019-06-23: 14:00:00 40 meq via ORAL
  Filled 2019-06-23: qty 2

## 2019-06-23 MED ORDER — HYDROCODONE-ACETAMINOPHEN 5-325 MG PO TABS
1.0000 | ORAL_TABLET | Freq: Once | ORAL | Status: AC
  Administered 2019-06-23: 14:00:00 1 via ORAL
  Filled 2019-06-23: qty 1

## 2019-06-23 MED ORDER — ONDANSETRON HCL 4 MG/2ML IJ SOLN
4.0000 mg | Freq: Once | INTRAMUSCULAR | Status: AC
  Administered 2019-06-23: 08:00:00 4 mg via INTRAVENOUS
  Filled 2019-06-23: qty 2

## 2019-06-23 MED ORDER — AMOXICILLIN-POT CLAVULANATE 875-125 MG PO TABS: 1.0000 | ORAL_TABLET | Freq: Two times a day (BID) | ORAL | 0 refills | Status: DC

## 2019-06-23 MED ORDER — HYDROCODONE-ACETAMINOPHEN 5-325 MG PO TABS: 1.0000 | ORAL_TABLET | ORAL | 0 refills | Status: DC | PRN

## 2019-06-23 NOTE — Discharge Instructions (Addendum)
Your foot is hurting because there is an infection in the toe in the foot.  To help this we are prescribing an antibiotic.  Also soak the right foot in warm water 3 times a day and clean it well with soap and water.  Whenever you are not soaking it, lie down and keep the right foot elevated above your heart, as much as possible.  We are treating your low potassium, with potassium pills.  It is probably low because of the diuretic that is combined with your blood pressure medicine.  Try to eat foods which contain more potassium.  We are prescribing a pain reliever to use as needed for pain.  Do not drive or drink alcohol when you are taking the pain reliever.  Follow-up with your doctor in a week or 2 to get checked for improvement of the foot infection, and repeat your potassium test.  Return here, if needed.

## 2019-06-23 NOTE — ED Notes (Signed)
Provider informed of lab result K = 2.6

## 2019-06-23 NOTE — ED Triage Notes (Signed)
Pt presents with c/o right foot pain. Pt reports the pain started yesterday, denies any injury. Pt does have some redness and swelling to the foot as well as blistering and bruising to the 2nd toe.

## 2019-06-23 NOTE — ED Provider Notes (Signed)
[facility] DEPT Provider Note   CSN: [id number] Arrival date & time: 06/23/19  [id number]     History Chief Complaint  Patient presents with  . Foot Pain    Terri [name] is a 44 y.o. female.  HPI She presents for evaluation of blistering on her toe, and pain with swelling in her foot.  Onset of symptoms several days ago with unknown etiology.  She denies cold exposures.  She denies fever, chills, cough, shortness of breath, weakness or dizziness.  She is able ambulate, and came here by private vehicle.  No prior similar problems.    Past Medical History:  Diagnosis Date  . Arthritis    Back and legs  . Bipolar disorder ([place])   . Chronic back pain   . Depression   . DVT (deep venous thrombosis) (HCC)    early 20's leg  . History of blood transfusion   . Hypertension   . Migraine headache   . Neuropathy   . Pneumonia   . 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 Sciatica   . Stress incontinence     Patient Active Problem List   Diagnosis Date Noted  . Acute pyelonephritis 02/11/2019  . Sepsis due to Escherichia coli (E. coli) ([place]) 02/11/2019  . AKI (acute kidney injury) ([place])   . Gastric ulcer 02/09/2019  . 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 Persistent fever 02/09/2019  . COVID-19 virus not detected   . Chest pain   . Bacteremia due to Escherichia coli   . Esophagitis   . Hypokalemia   . Hypomagnesemia   . Sepsis ([place]) 02/02/2019  . Leucocytosis 02/02/2019  . Thrombocytosis ([place]) 02/02/2019  . UTI (urinary tract infection) 02/02/2019  . GIB (gastrointestinal bleeding) 09/13/2017  . Apnea 09/13/2017  . Essential hypertension   . Cellulitis and abscess of neck 08/12/2017  . Cellulitis of submandibular region 08/10/2017  . Odontogenic infection of jaw 08/10/2017  . Cholecystitis 02/14/2017  . Opioid abuse with opioid-induced mood disorder ([place]) 01/25/2017  . Anxiety 02/11/2014  . Depression 07/12/2012    Past Surgical History:   Procedure Laterality Date  . ALVEOLOPLASTY Bilateral 01/31/2018   Procedure: ALVEOLOPLASTY;  Surgeon: Diona Browner, DDS;  Location: [facility];  Service: Oral Surgery;  Laterality: Bilateral;  . ANKLE SURGERY Left 1992   with hardware  . BACK SURGERY    . BIOPSY  02/08/2019   Procedure: BIOPSY;  Surgeon: Rush Landmark Telford Nab., MD;  Location: [facility];  Service: Gastroenterology;;  . CESAREAN SECTION    . CHOLECYSTECTOMY N/A 02/15/2017   Procedure: LAPAROSCOPIC CHOLECYSTECTOMY;  Surgeon: Clovis Riley, MD;  Location: [facility];  Service: General;  Laterality: N/A;  . ENTEROSCOPY N/A 02/08/2019   Procedure: ENTEROSCOPY;  Surgeon: Rush Landmark Telford Nab., MD;  Location: [facility];  Service: Gastroenterology;  Laterality: N/A;  . ESOPHAGOGASTRODUODENOSCOPY (EGD) WITH PROPOFOL N/A 09/15/2017   Procedure: ESOPHAGOGASTRODUODENOSCOPY (EGD) WITH PROPOFOL;  Surgeon: Clarene Essex, MD;  [facility];  Service: Endoscopy;  Laterality: N/A;  . ESOPHAGOGASTRODUODENOSCOPY (EGD) WITH PROPOFOL N/A 02/08/2019   Procedure: ESOPHAGOGASTRODUODENOSCOPY (EGD) WITH PROPOFOL;  Surgeon: Rush Landmark Telford Nab., MD;  Location: [facility];  Service: Gastroenterology;  Laterality: N/A;  . GASTRIC BYPASS    . INCISION AND DRAINAGE OF PERITONSILLAR ABCESS Left 08/13/2017   Procedure: INCISION AND DRAINAGE OF LEFT NECK ABSCESS;  Surgeon: Melida Quitter, MD;  Location: [facility];  Service: ENT;  Laterality: Left;  . LAPAROSCOPIC LYSIS OF ADHESIONS  02/15/2017   Procedure: LAPAROSCOPIC LYSIS OF ADHESIONS;  Surgeon: Clovis Riley, MD;  Location: [facility];  Service: General;;  . LUMBAR [place]    . LUMBAR FUSION    . SAVORY DILATION N/A 02/08/2019   Procedure: SAVORY DILATION;  Surgeon: Rush Landmark Telford Nab., MD;  Location: [facility];  Service: Gastroenterology;  Laterality: N/A;  . TOOTH EXTRACTION Bilateral 01/31/2018   Procedure: DENTAL RESTORATION/EXTRACTIONS;  Surgeon: Diona Browner, DDS;  Location: [facility];  Service: Oral Surgery;  Laterality: Bilateral;  . TUBAL LIGATION    . UPPER GI ENDOSCOPY N/A 02/15/2017   Procedure: UPPER GI ENDOSCOPY;  Surgeon: Clovis Riley, MD;  Location: [facility];  Service: General;  Laterality: N/A;     OB History   No obstetric history on file.     Family History  Problem Relation Age of Onset  . Diabetes Mother   . Hypertension Mother   . Diabetes Father   . Hypertension Father     Social History   Tobacco Use  . Smoking status: Current Every Day Smoker    Packs/day: 1.00    Years: 29.00    Pack years: 29.00    Types: Cigarettes  . Smokeless tobacco: Never Used  Substance Use Topics  . Alcohol use: No  . Drug use: Not Currently    Types: IV    Comment: Heroin- last time 05/2017    Home Medications Prior to Admission medications   Medication Sig Start Date End Date Taking? Authorizing Provider  amitriptyline (ELAVIL) 50 MG tablet Take 50 mg by mouth at bedtime. 04/23/19  Yes [provider]  amLODipine (NORVASC) 5 MG tablet Take 5 mg by mouth daily. 04/23/19  Yes [provider]  gabapentin (NEURONTIN) 300 MG capsule Take 300 mg by mouth 3 (three) times daily.   Yes [provider]  ibuprofen (ADVIL) 200 MG tablet Take 800 mg by mouth every 6 (six) hours as needed for moderate pain.   Yes [provider]  pantoprazole (PROTONIX) 40 MG tablet Take 1 tablet (40 mg total) by mouth 2 (two) times daily before a meal. 02/11/19 02/11/20 Yes Eugenie Filler, MD  acetaminophen (TYLENOL) 500 MG tablet Take 1 tablet (500 mg total) by mouth every 6 (six) hours as needed for moderate pain. Patient not taking: Reported on 06/23/2019 01/31/18   Diona Browner, DDS  albuterol (PROVENTIL HFA;VENTOLIN HFA) 108 (90 BASE) MCG/ACT inhaler Inhale 1-2 puffs into the lungs every 4 (four) hours as needed for wheezing or shortness of breath. Patient not taking: Reported on 06/23/2019 03/07/15   Pisciotta, Elmyra Ricks, PA-C   amoxicillin-clavulanate (AUGMENTIN) 875-125 MG tablet Take 1 tablet by mouth 2 (two) times daily. One po bid x 7 days 06/23/19   Daleen Bo, MD  DULoxetine (CYMBALTA) 60 MG capsule Take 60 mg by mouth daily. 05/28/19   [provider]  HYDROcodone-acetaminophen (NORCO) 5-325 MG tablet Take 1 tablet by mouth every 4 (four) hours as needed for moderate pain. 06/23/19   Daleen Bo, MD  lisinopril-hydrochlorothiazide (PRINZIDE,ZESTORETIC) 10-12.5 MG tablet Take 1 tablet by mouth daily. 01/22/17   [provider]  nabumetone (RELAFEN) 750 MG tablet Take 750 mg by mouth 2 (two) times daily as needed. 05/28/19   [provider]  polyethylene glycol (MIRALAX) packet Take 17 g by mouth daily. Patient not taking: Reported on 05/17/2018 04/02/18   Hayden Rasmussen, MD  potassium chloride SA (KLOR-CON) 20 MEQ tablet Take 1 tablet (20 mEq total) by  mouth 2 (two) times daily. 06/23/19   Daleen Bo, MD  senna (SENOKOT) 8.6 MG TABS tablet Take 1 tablet (8.6 mg total) by mouth daily. Patient not taking: Reported on 06/23/2019 02/11/19   Eugenie Filler, MD  traMADol (ULTRAM) 50 MG tablet Take 1 tablet (50 mg total) by mouth every 6 (six) hours as needed. Patient not taking: Reported on 06/23/2019 02/11/19   Eugenie Filler, MD    Allergies    Ketorolac tromethamine and Methocarbamol  Review of Systems   Review of Systems  All other systems reviewed and are negative.   Physical Exam Updated Vital Signs BP 138/88   Pulse 88   Temp 98.4 F (36.9 C)   Resp 16   LMP 06/16/2019 (Approximate)   SpO2 98%   Physical Exam Vitals and nursing note reviewed.  Constitutional:      General: She is in acute distress (Uncomfortable, crying with pain).     Appearance: She is well-developed. She is not ill-appearing, toxic-appearing or diaphoretic.  HENT:     Head: Normocephalic and atraumatic.     Right Ear: External ear normal.     Left Ear: External ear normal.  Eyes:      Conjunctiva/sclera: Conjunctivae normal.     Pupils: Pupils are equal, round, and reactive to light.  Neck:     Trachea: Phonation normal.  Cardiovascular:     Rate and Rhythm: Normal rate.  Pulmonary:     Effort: Pulmonary effort is normal.  Musculoskeletal:        General: Normal range of motion.     Cervical back: Normal range of motion and neck supple.     Comments: Mild redness right foot, associated with swelling and blistering of right middle toe.  Skin:    General: Skin is warm and dry.     Comments: Blistering right second toe, dorsally, with erythema at the base.  Blister is not consistent with burn injury.  Neurological:     Mental Status: She is alert and oriented to person, place, and time.     Cranial Nerves: No cranial nerve deficit.     Sensory: No sensory deficit.     Motor: No abnormal muscle tone.     Coordination: Coordination normal.  Psychiatric:        Mood and Affect: Mood normal.        Behavior: Behavior normal.        Thought Content: Thought content normal.        Judgment: Judgment normal.       ED Results / Procedures / Treatments   Labs (all labs ordered are listed, but only abnormal results are displayed) Labs Reviewed  COMPREHENSIVE METABOLIC PANEL - Abnormal; Notable for the following components:      Result Value   Potassium 2.6 (*)    Glucose, Bld 115 (*)    All other components within normal limits  CBC WITH DIFFERENTIAL/PLATELET - Abnormal; Notable for the following components:   WBC 10.7 (*)    HCT 35.1 (*)    RDW 17.0 (*)    Neutro Abs 8.1 (*)    All other components within normal limits  LACTIC ACID, PLASMA    EKG None  Radiology DG Foot Complete Right  Result Date: 06/23/2019 CLINICAL DATA:  Right foot pain radiating to right lower leg beginning yesterday. No injury. EXAM: RIGHT FOOT COMPLETE - 3+ VIEW COMPARISON:  None. FINDINGS: No evidence of fracture or dislocation. No focal lytic or  sclerotic lesion. Small inferior  calcaneal spur. Remainder the exam is unremarkable. IMPRESSION: No acute findings. Electronically Signed   By: Marin Olp M.D.   On: 06/23/2019 08:42    Procedures Procedures (including critical care time)  Medications Ordered in ED Medications  fentaNYL (SUBLIMAZE) injection 100 mcg (100 mcg Intravenous Given 06/23/19 1053)  ondansetron (ZOFRAN) injection 4 mg (4 mg Intravenous Given 06/23/19 0823)  sodium chloride 0.9 % bolus 1,000 mL (0 mLs Intravenous Stopped 06/23/19 0924)  potassium chloride SA (KLOR-CON) CR tablet 40 mEq (40 mEq Oral Given 06/23/19 1349)  amoxicillin-clavulanate (AUGMENTIN) 875-125 MG per tablet 1 tablet (1 tablet Oral Given 06/23/19 1349)  HYDROcodone-acetaminophen (NORCO/VICODIN) 5-325 MG per tablet 1 tablet (1 tablet Oral Given 06/23/19 1349)    ED Course  I have reviewed the triage vital signs and the nursing notes.  Pertinent labs & imaging results that were available during my care of the patient were reviewed by me and considered in my medical decision making (see chart for details).  Clinical Course as of Jun 22 1426  Sat Jun 23, 2019  1332 Normal except potassium low, glucose high  Comprehensive metabolic panel(!!) [EW]  [id number] Normal  Lactic acid, plasma [EW]  1332 Normal except white count high  CBC with Differential(!) [EW]  1427 No fracture or osteomyelitis, interpreted by me  DG Foot Complete Right [EW]    Clinical Course User Index [EW] Daleen Bo, MD   MDM Rules/Calculators/A&P                       Patient Vitals for the past 24 hrs:  BP Temp Pulse Resp SpO2  06/23/19 1341 138/88 - 88 16 98 %  06/23/19 1143 (!) 134/94 - 89 16 98 %  06/23/19 0956 (!) 132/96 - 88 16 98 %  06/23/19 0930 (!) 132/96 - - - -  06/23/19 0900 (!) 139/96 - 88 16 99 %  06/23/19 0824 (!) 163/98 - 90 18 98 %  06/23/19 0629 (!) 159/114 98.4 F (36.9 C) 95 20 97 %    2:06 PM Reevaluation with update and discussion. After initial assessment and treatment, an  updated evaluation reveals she is resting comfortably, findings discussed and all questions answered. Daleen Bo   Medical Decision Making: Blister on toe, etiology not clear.  Mild cellulitis of the foot.  Doubt significant ascending infection, sepsis, impending vascular collapse.  Incidental hypokalemia likely related to use of diuretic medication.  No vomiting so this can be treated with oral medications.  CRITICAL CARE-no Performed by: Daleen Bo   Nursing Notes Reviewed/ Care Coordinated Applicable Imaging Reviewed Interpretation of Laboratory Data incorporated into ED treatment  The patient appears reasonably screened and/or stabilized for discharge and I doubt any other medical condition or other [facility] requiring further screening, evaluation, or treatment in the ED at this time prior to discharge.  Plan: Home Medications-continue usual medications; Home Treatments-wound care right foot with soaks frequently elevate frequently; return here if the recommended treatment, does not improve the symptoms; Recommended follow up-PCP checkup 1 to 2 weeks for repeat potassium test and check on foot.    Final Clinical Impression(s) / ED Diagnoses Final diagnoses:  Foot infection    Rx / DC Orders ED Discharge Orders         Ordered    potassium chloride SA (KLOR-CON) 20 MEQ tablet  2 times daily     06/23/19 1425    amoxicillin-clavulanate (AUGMENTIN) 875-125 MG tablet  2 times daily     06/23/19 1425    HYDROcodone-acetaminophen (NORCO) 5-325 MG tablet  Every 4 hours PRN     06/23/19 1425           Daleen Bo, MD 06/23/19 1429

## 2019-06-26 ENCOUNTER — Other Ambulatory Visit: Payer: Self-pay

## 2019-06-26 ENCOUNTER — Encounter (HOSPITAL_COMMUNITY): Payer: Self-pay | Admitting: *Deleted

## 2019-06-26 ENCOUNTER — Emergency Department (HOSPITAL_COMMUNITY)
Admission: EM | Admit: 2019-06-26 | Discharge: 2019-06-26 | Discharge status: Against Medical Advice | Payer: Medicare Other | Attending: Emergency Medicine | Admitting: Emergency Medicine

## 2019-06-26 DIAGNOSIS — R2242 Localized swelling, mass and lump, left lower limb: Secondary | ICD-10-CM | POA: Diagnosis not present

## 2019-06-26 DIAGNOSIS — L02612 Cutaneous abscess of left foot: Secondary | ICD-10-CM

## 2019-06-26 DIAGNOSIS — F1410 Cocaine abuse, uncomplicated: Secondary | ICD-10-CM | POA: Insufficient documentation

## 2019-06-26 DIAGNOSIS — L03032 Cellulitis of left toe: Secondary | ICD-10-CM | POA: Diagnosis not present

## 2019-06-26 DIAGNOSIS — F17210 Nicotine dependence, cigarettes, uncomplicated: Secondary | ICD-10-CM | POA: Diagnosis not present

## 2019-06-26 DIAGNOSIS — F1110 Opioid abuse, uncomplicated: Secondary | ICD-10-CM | POA: Insufficient documentation

## 2019-06-26 DIAGNOSIS — I10 Essential (primary) hypertension: Secondary | ICD-10-CM | POA: Diagnosis not present

## 2019-06-26 DIAGNOSIS — M79675 Pain in left toe(s): Secondary | ICD-10-CM | POA: Diagnosis present

## 2019-06-26 LAB — HEPATITIS PANEL, ACUTE
HCV Ab: NONREACTIVE
Hep A IgM: NONREACTIVE
Hep B C IgM: NONREACTIVE
Hepatitis B Surface Ag: NONREACTIVE

## 2019-06-26 LAB — CBC WITH DIFFERENTIAL/PLATELET
Abs Immature Granulocytes: 0.03 10*3/uL (ref 0.00–0.07)
Basophils Absolute: 0.1 10*3/uL (ref 0.0–0.1)
Basophils Relative: 1 %
Eosinophils Absolute: 0.2 10*3/uL (ref 0.0–0.5)
Eosinophils Relative: 2 %
HCT: 32.8 % — ABNORMAL LOW (ref 36.0–46.0)
Hemoglobin: 11.4 g/dL — ABNORMAL LOW (ref 12.0–15.0)
Immature Granulocytes: 0 %
Lymphocytes Relative: 32 %
Lymphs Abs: 3 10*3/uL (ref 0.7–4.0)
MCH: 28.9 pg (ref 26.0–34.0)
MCHC: 34.8 g/dL (ref 30.0–36.0)
MCV: 83.2 fL (ref 80.0–100.0)
Monocytes Absolute: 0.5 10*3/uL (ref 0.1–1.0)
Monocytes Relative: 5 %
Neutro Abs: 5.6 10*3/uL (ref 1.7–7.7)
Neutrophils Relative %: 60 %
Platelets: 253 10*3/uL (ref 150–400)
RBC: 3.94 MIL/uL (ref 3.87–5.11)
RDW: 17.2 % — ABNORMAL HIGH (ref 11.5–15.5)
WBC: 9.3 10*3/uL (ref 4.0–10.5)
nRBC: 0 % (ref 0.0–0.2)

## 2019-06-26 LAB — COMPREHENSIVE METABOLIC PANEL
ALT: 10 U/L (ref 0–44)
AST: 16 U/L (ref 15–41)
Albumin: 3.1 g/dL — ABNORMAL LOW (ref 3.5–5.0)
Alkaline Phosphatase: 74 U/L (ref 38–126)
Anion gap: 7 (ref 5–15)
BUN: 10 mg/dL (ref 6–20)
CO2: 26 mmol/L (ref 22–32)
Calcium: 8.3 mg/dL — ABNORMAL LOW (ref 8.9–10.3)
Chloride: 106 mmol/L (ref 98–111)
Creatinine, Ser: 0.78 mg/dL (ref 0.44–1.00)
GFR calc Af Amer: >60 mL/min (ref >60)
GFR calc non Af Amer: >60 mL/min (ref >60)
Glucose, Bld: 85 mg/dL (ref 70–99)
Potassium: 2.9 mmol/L — ABNORMAL LOW (ref 3.5–5.1)
Sodium: 139 mmol/L (ref 135–145)
Total Bilirubin: 0.7 mg/dL (ref 0.3–1.2)
Total Protein: 5.9 g/dL — ABNORMAL LOW (ref 6.5–8.1)

## 2019-06-26 LAB — HIV ANTIBODY (ROUTINE TESTING W REFLEX): HIV Screen 4th Generation wRfx: NONREACTIVE

## 2019-06-26 LAB — LACTIC ACID, PLASMA: Lactic Acid, Venous: 1.3 mmol/L (ref 0.5–1.9)

## 2019-06-26 MED ORDER — SODIUM CHLORIDE 0.9 % IV BOLUS
1000.0000 mL | Freq: Once | INTRAVENOUS | Status: AC
  Administered 2019-06-26: 16:00:00 1000 mL via INTRAVENOUS

## 2019-06-26 MED ORDER — BUPRENORPHINE HCL 0.3 MG/ML IJ SOLN
0.3000 mg | INTRAMUSCULAR | Status: AC
  Administered 2019-06-26: 16:00:00 0.3 mg via INTRAVENOUS
  Filled 2019-06-26: qty 1

## 2019-06-26 MED ORDER — BUPRENORPHINE HCL 0.3 MG/ML IJ SOLN: 0.3000 mg | INTRAMUSCULAR | Status: DC

## 2019-06-26 MED ORDER — DOXYCYCLINE HYCLATE 100 MG PO CAPS: 100.0000 mg | ORAL_CAPSULE | Freq: Two times a day (BID) | ORAL | 0 refills | Status: AC

## 2019-06-26 NOTE — ED Notes (Addendum)
[place], RN came to assist in putting in an IV. Patient was being rude to staff and cursing. RN explained to patient that you cannot have IV antibiotics at home and she is at risk for osteomyelitis. RN was attempting to provide education on the necessity of antibiotics and obtaining an IV, when patient got on the phone telling someone "I am giving this another 30 minutes and I am leaving this damn place" Patient started cursing again at RN and making threats. RN made [place], [place] aware

## 2019-06-26 NOTE — ED Notes (Signed)
Wylder, PA would like another nurse to try for an IV since she is now agreed to be stuck for an IV.

## 2019-06-26 NOTE — ED Notes (Addendum)
RN attempted to obtain IV on patient and patient became angry at RN during IV attempt. RN had just pierced the skin patient screamed and jerked her arm stating "Don't dig in my arm! If you can't get it get someone who can!" This RN explained that she was not digging in the patient's arm but trying to obtain an IV for antibiotics and to give pain medication. Patient became tearful and jerked her arm again caused the IV to blow. Blood work was obtained but the IV was not able to be flushed in.

## 2019-06-26 NOTE — ED Notes (Signed)
Per [facility] states sending patient to ED for infection of second toe on right foot-states increased pain and hypertensive

## 2019-06-26 NOTE — ED Triage Notes (Signed)
Patient reports treatment here 1/30 for blister on right second toe.  Patient reports infection and pain have increased.  Patient has large, open wound on toe, with yellowish drainage, and redness extending up to right ankle

## 2019-06-26 NOTE — ED Notes (Signed)
As soon as I walked into the room and explained what orders would be performed, she was upset and raising her voice because she wanted pain medication. Informed patient IV was necessary for blood work and possible IV antibiotics. Attempted IV in the right AC, was able to get one lactic tube and the vein infiltrated. Patient stated she was not going to be stuck again and wanted oral antibiotics. Informed Wylder, PA of patients actions and sent lactic acid to main lab.

## 2019-06-26 NOTE — ED Notes (Signed)
This nurse pulled out from another patients room by a tech because patient was threatening to take her IV out and leave because she had a ride. Immediately called Wylder, PA , and informed him of patients actions. He reported to remove patients IV and she could leave AMA. Went to patients room and she had eloped. IV had been removed by her with catheter intact.

## 2019-06-26 NOTE — ED Provider Notes (Signed)
[facility] DEPT Provider Note   CSN: [id number] Arrival date & time: 06/26/19  1104     History Chief Complaint  Patient presents with  . Toe Pain    CEIRA WILLOCKS is a 44 y.o. female history of hypertension, migraines, bipolar, chronic back pain, depression, neuropathy  HPI  Patient is a 44 year old female with history of heroin and cocaine use presented today with blistering/worsening pain in her left toe and swelling in her foot.  She states symptoms began 1/28 and have progressed since.  She states she has had no improvement on antibiotics that she was prescribed 1/30 when she was seen in ED.  Patient states that her toe pain is constant, achy, severe, worse with touch, worse with walking no mitigating factors.  Pain does not radiate.  She denies any fevers, chills, cough, congestion, fatigue, body aches or weakness.  She is requesting pain medication.  She states that she was given pain pills at her last visit but has taken them all.  Patient states that the last time using heroin was several days ago.  States she is not injecting heroin at all but rather snorting it and cocaine.  Patient denies any steroid use and states that she does not have a history of diabetes.  No history of HIV.  No history of any other immunosuppression.    Past Medical History:  Diagnosis Date  . Arthritis    Back and legs  . Bipolar disorder ([place])   . Chronic back pain   . Depression   . DVT (deep venous thrombosis) (HCC)    early 20's leg  . History of blood transfusion   . Hypertension   . Migraine headache   . Neuropathy   . Pneumonia   . 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 Sciatica   . Stress incontinence     Patient Active Problem List   Diagnosis Date Noted  . Acute pyelonephritis 02/11/2019  . Sepsis due to Escherichia coli (E. coli) ([place]) 02/11/2019  . AKI (acute kidney injury) ([place])   . Gastric ulcer 02/09/2019  . S/P gastric bypass  02/09/2019  . Persistent fever 02/09/2019  . COVID-19 virus not detected   . Chest pain   . Bacteremia due to Escherichia coli   . Esophagitis   . Hypokalemia   . Hypomagnesemia   . Sepsis ([place]) 02/02/2019  . Leucocytosis 02/02/2019  . Thrombocytosis ([place]) 02/02/2019  . UTI (urinary tract infection) 02/02/2019  . GIB (gastrointestinal bleeding) 09/13/2017  . Apnea 09/13/2017  . Essential hypertension   . Cellulitis and abscess of neck 08/12/2017  . Cellulitis of submandibular region 08/10/2017  . Odontogenic infection of jaw 08/10/2017  . Cholecystitis 02/14/2017  . Opioid abuse with opioid-induced mood disorder ([place]) 01/25/2017  . Anxiety 02/11/2014  . Depression 07/12/2012    Past Surgical History:  Procedure Laterality Date  . ALVEOLOPLASTY Bilateral 01/31/2018   Procedure: ALVEOLOPLASTY;  Surgeon: Diona Browner, DDS;  Location: [facility];  Service: Oral Surgery;  Laterality: Bilateral;  . ANKLE SURGERY Left 1992   with hardware  . BACK SURGERY    . BIOPSY  02/08/2019   Procedure: BIOPSY;  Surgeon: Rush Landmark Telford Nab., MD;  Location: [facility];  Service: Gastroenterology;;  . CESAREAN SECTION    . CHOLECYSTECTOMY N/A 02/15/2017   Procedure: LAPAROSCOPIC CHOLECYSTECTOMY;  Surgeon: Clovis Riley, MD;  Location: [facility];  Service: General;  Laterality: N/A;  . ENTEROSCOPY N/A 02/08/2019  Procedure: ENTEROSCOPY;  Surgeon: Rush Landmark Telford Nab., MD;  Location: [facility];  Service: Gastroenterology;  Laterality: N/A;  . ESOPHAGOGASTRODUODENOSCOPY (EGD) WITH PROPOFOL N/A 09/15/2017   Procedure: ESOPHAGOGASTRODUODENOSCOPY (EGD) WITH PROPOFOL;  Surgeon: Clarene Essex, MD;  Location: [facility];  Service: Endoscopy;  Laterality: N/A;  . ESOPHAGOGASTRODUODENOSCOPY (EGD) WITH PROPOFOL N/A 02/08/2019   Procedure: ESOPHAGOGASTRODUODENOSCOPY (EGD) WITH PROPOFOL;  Surgeon: Rush Landmark Telford Nab., MD;  [facility]: [facility];  Service: Gastroenterology;  Laterality: N/A;  . GASTRIC  BYPASS    . INCISION AND DRAINAGE OF PERITONSILLAR ABCESS Left 08/13/2017   Procedure: INCISION AND DRAINAGE OF LEFT NECK ABSCESS;  Surgeon: Melida Quitter, MD;  Location: [facility];  Service: ENT;  Laterality: Left;  . LAPAROSCOPIC LYSIS OF ADHESIONS  02/15/2017   Procedure: LAPAROSCOPIC LYSIS OF ADHESIONS;  Surgeon: Clovis Riley, MD;  Location: [facility];  Service: General;;  . LUMBAR [place]    . LUMBAR FUSION    . SAVORY DILATION N/A 02/08/2019   Procedure: SAVORY DILATION;  Surgeon: Rush Landmark Telford Nab., MD;  Location: [facility];  Service: Gastroenterology;  Laterality: N/A;  . TOOTH EXTRACTION Bilateral 01/31/2018   Procedure: DENTAL RESTORATION/EXTRACTIONS;  Surgeon: Diona Browner, DDS;  Location: [facility];  Service: Oral Surgery;  Laterality: Bilateral;  . TUBAL LIGATION    . UPPER GI ENDOSCOPY N/A 02/15/2017   Procedure: UPPER GI ENDOSCOPY;  Surgeon: Clovis Riley, MD;  Location: [facility];  Service: General;  Laterality: N/A;     OB History   No obstetric history on file.     Family History  Problem Relation Age of Onset  . Diabetes Mother   . Hypertension Mother   . Diabetes Father   . Hypertension Father     Social History   Tobacco Use  . Smoking status: Current Every Day Smoker    Packs/day: 1.00    Years: 29.00    Pack years: 29.00    Types: Cigarettes  . Smokeless tobacco: Never Used  Substance Use Topics  . Alcohol use: No  . Drug use: Not Currently    Types: IV    Comment: Heroin- last time 05/2017    Home Medications Prior to Admission medications   Medication Sig Start Date End Date Taking? Authorizing Provider  amitriptyline (ELAVIL) 50 MG tablet Take 50 mg by mouth at bedtime. 04/23/19  Yes [provider]  amLODipine (NORVASC) 5 MG tablet Take 5 mg by mouth daily. 04/23/19  Yes [provider]  amoxicillin-clavulanate (AUGMENTIN) 875-125 MG tablet Take 1 tablet by mouth 2 (two) times daily. One po bid x 7 days 06/23/19  Yes  Daleen Bo, MD  gabapentin (NEURONTIN) 300 MG capsule Take 300 mg by mouth 3 (three) times daily.   Yes [provider]  HYDROcodone-acetaminophen (NORCO) 5-325 MG tablet Take 1 tablet by mouth every 4 (four) hours as needed for moderate pain. 06/23/19  Yes Daleen Bo, MD  ibuprofen (ADVIL) 200 MG tablet Take 800 mg by mouth every 6 (six) hours as needed for moderate pain.   Yes [provider]  pantoprazole (PROTONIX) 40 MG tablet Take 1 tablet (40 mg total) by mouth 2 (two) times daily before a meal. 02/11/19 02/11/20 Yes Eugenie Filler, MD  acetaminophen (TYLENOL) 500 MG tablet Take 1 tablet (500 mg total) by mouth every 6 (six) hours as needed for moderate pain. Patient not taking: Reported on 06/23/2019 01/31/18   Diona Browner, DDS  albuterol (PROVENTIL HFA;VENTOLIN HFA) 108 (90 BASE) MCG/ACT inhaler Inhale  1-2 puffs into the lungs every 4 (four) hours as needed for wheezing or shortness of breath. Patient not taking: Reported on 06/23/2019 03/07/15   Pisciotta, Elmyra Ricks, PA-C  doxycycline (VIBRAMYCIN) 100 MG capsule Take 1 capsule (100 mg total) by mouth 2 (two) times daily for 10 days. 06/26/19 07/06/19  Tedd Sias, PA  DULoxetine (CYMBALTA) 60 MG capsule Take 60 mg by mouth daily. 05/28/19   [provider]  lisinopril-hydrochlorothiazide (PRINZIDE,ZESTORETIC) 10-12.5 MG tablet Take 1 tablet by mouth daily. 01/22/17   [provider]  nabumetone (RELAFEN) 750 MG tablet Take 750 mg by mouth 2 (two) times daily as needed. 05/28/19   [provider]  polyethylene glycol (MIRALAX) packet Take 17 g by mouth daily. Patient not taking: Reported on 05/17/2018 04/02/18   Hayden Rasmussen, MD  potassium chloride SA (KLOR-CON) 20 MEQ tablet Take 1 tablet (20 mEq total) by mouth 2 (two) times daily. 06/23/19   Daleen Bo, MD  senna (SENOKOT) 8.6 MG TABS tablet Take 1 tablet (8.6 mg total) by mouth daily. Patient not taking: Reported on 06/23/2019 02/11/19    Eugenie Filler, MD  traMADol (ULTRAM) 50 MG tablet Take 1 tablet (50 mg total) by mouth every 6 (six) hours as needed. Patient not taking: Reported on 06/23/2019 02/11/19   Eugenie Filler, MD    Allergies    Ketorolac tromethamine and Methocarbamol  Review of Systems   Review of Systems  Constitutional: Negative for chills and fever.  HENT: Negative for congestion.   Eyes: Negative for pain.  Respiratory: Negative for cough and shortness of breath.   Cardiovascular: Negative for chest pain and leg swelling.  Gastrointestinal: Negative for abdominal pain and vomiting.  Genitourinary: Negative for dysuria.  Musculoskeletal: Negative for myalgias.       Toe pain  Skin: Negative for rash.  Neurological: Negative for dizziness and headaches.    Physical Exam Updated Vital Signs BP (!) 195/120   Pulse 95   Temp 98.2 F (36.8 C) (Oral)   Resp 18   Ht 5\' 9"  (1.753 m)   Wt 83 kg   LMP 06/16/2019 (Approximate)   SpO2 99%   BMI 27.02 kg/m   Physical Exam Vitals and nursing note reviewed.  Constitutional:      General: She is not in acute distress.    Appearance: Normal appearance. She is not ill-appearing.  HENT:     Head: Normocephalic and atraumatic.  Eyes:     General: No scleral icterus.       Right eye: No discharge.        Left eye: No discharge.     Conjunctiva/sclera: Conjunctivae normal.  Cardiovascular:     Rate and Rhythm: Normal rate.     Comments: Heart rate 84 Pulmonary:     Effort: Pulmonary effort is normal.     Breath sounds: No stridor.  Musculoskeletal:     Comments: Tenderness to palpation of the dorsum of the foot.  Tenderness to palpation over right second toe.  Full range of motion of toes and ankle.  Skin:    Comments: Red warm to touch skin over dorsum of toe.  There is open wound to the right second toe.  There is mild scant purulent drainage  Neurological:     Mental Status: She is alert and oriented to person, place, and time.  Mental status is at baseline.      06/26/19   06/23/2019     ED Results /  Procedures / Treatments   Labs (all labs ordered are listed, but only abnormal results are displayed) Labs Reviewed  CBC WITH DIFFERENTIAL/PLATELET - Abnormal; Notable for the following components:      Result Value   Hemoglobin 11.4 (*)    HCT 32.8 (*)    RDW 17.2 (*)    All other components within normal limits  COMPREHENSIVE METABOLIC PANEL - Abnormal; Notable for the following components:   Potassium 2.9 (*)    Calcium 8.3 (*)    Total Protein 5.9 (*)    Albumin 3.1 (*)    All other components within normal limits  AEROBIC CULTURE (SUPERFICIAL SPECIMEN)  CULTURE, BLOOD (SINGLE)  LACTIC ACID, PLASMA  LACTIC ACID, PLASMA  HEPATITIS PANEL, ACUTE  HIV ANTIBODY (ROUTINE TESTING W REFLEX)    EKG None  Radiology No results found.  Procedures Procedures (including critical care time)  Medications Ordered in ED Medications  buprenorphine (BUPRENEX) injection 0.3 mg (has no administration in time range)  buprenorphine (BUPRENEX) injection 0.3 mg (0.3 mg Intravenous Given 06/26/19 1621)  sodium chloride 0.9 % bolus 1,000 mL (1,000 mLs Intravenous New Bag/Given 06/26/19 1621)    ED Course  I have reviewed the triage vital signs and the nursing notes.  Pertinent labs & imaging results that were available during my care of the patient were reviewed by me and considered in my medical decision making (see chart for details).    MDM Rules/Calculators/A&P                       Patient is a 44 year old female with no significant past medical history other than polysubstance abuse.  Regular heroin user however no recent IV use per patient.  Patient is well-appearing on physical exam has vitals within normal limits apart from hypertension which was measured in triage and not repeated due to patient's refusing vital sign reassessment.  My examination patient has right second toe infection appears to be  superficial however there does appear to be worsening of the blister which is now open and has some purulent discharge.  There does also appear to be some redness that is streaking up her leg.  Concern for osteomyelitis versus worsening cellulitis versus abscess.  Will obtain lab work including blood cultures, wound culture, lactate, CBC, CMP.  We will also obtain hepatitis and HIV testing as patient has endorsed drug use.  4:58 PM informed by nursing staff the patient has demanded that I be removed.  She states that her ride is here.  She will be discharged this time.    Patient wishes to leave [facility]. I personally explained need for further testing and my concerns for adverse outcomes if workup is incomplete. Specific concerns explained to patient include worsening symptoms, functional loss, long term sickness and death. Patient states understanding of risks and states they will return if they feel the need to at a later date to receive the recommended care or any other care at any time, regardless of their ability to pay for such care. Patient understands they are able to return at any time. Patient is able to explain back the risks of leaving AMA and still wishes to leave.   Specifically I recommend thorough work-up to rule out systemic infection or osteomyelitis to evaluate and exclude serious disease.  The patient is oriented to person, place, and time, has the capacity to make decisions regarding the medical care offered. The patient speaks coherently and exhibits no evidence  of having an altered level of consciousness or alcohol or drug intoxication to a point that would impair judgment. They respond knowingly to questions about recommended treatment and alternate treatments including no further testing or treatment; participate in diagnostic and treatment decisions by means of rational thought processes; and understand the items of minimum basic medical treatment information with  respect to that treatment (the nature and seriousness of the illness, the nature of the treatment, the probable degree and duration of any benefits and risks of any medical intervention that is being recommended, and the consequences of lack of treatment, and the nature, risks, and benefits of any reasonable alternatives).  The patient understands the relevant information of the nature of their medical condition, as well as the risks, benefits, and treatment alternatives (including non-treatment), consequences of refusing care, and can competently communicate a rational explanation about their choice of care options.    Included in AVS was the following message:  You have chosen to leave [facility]. Should you change your mind, you are always welcome and encouraged to return to the ED. You are encouraged to follow-up with, at the very least, a primary care provider, or other similar medical professional on this matter.   Final Clinical Impression(s) / ED Diagnoses Final diagnoses:  Abscess or cellulitis, toe, left    Rx / DC Orders ED Discharge Orders         Ordered    doxycycline (VIBRAMYCIN) 100 MG capsule  2 times daily     06/26/19 1657           Pati Gallo [place], [place] 06/27/19 1114    Blanchie Dessert, MD 06/29/19 0710

## 2019-06-26 NOTE — Discharge Instructions (Signed)
Please follow-up with your primary care doctor.  Please return to ED if you have any new or concerning symptoms including fevers, body aches, systemic symptoms such as severe fatigue.

## 2019-07-01 LAB — CULTURE, BLOOD (SINGLE)
Culture: NO GROWTH
Special Requests: ADEQUATE

## 2019-07-15 ENCOUNTER — Emergency Department (HOSPITAL_COMMUNITY)
Admission: EM | Admit: 2019-07-15 | Discharge: 2019-07-16 | Disposition: A | Discharge status: Home / Self Care | Payer: Medicare Other | Attending: Emergency Medicine | Admitting: Emergency Medicine

## 2019-07-15 ENCOUNTER — Encounter (HOSPITAL_COMMUNITY): Payer: Self-pay | Admitting: Emergency Medicine

## 2019-07-15 DIAGNOSIS — Z79899 Other long term (current) drug therapy: Secondary | ICD-10-CM | POA: Insufficient documentation

## 2019-07-15 DIAGNOSIS — R456 Violent behavior: Secondary | ICD-10-CM | POA: Diagnosis not present

## 2019-07-15 DIAGNOSIS — R519 Headache, unspecified: Secondary | ICD-10-CM | POA: Insufficient documentation

## 2019-07-15 DIAGNOSIS — X58XXXD Exposure to other specified factors, subsequent encounter: Secondary | ICD-10-CM | POA: Insufficient documentation

## 2019-07-15 DIAGNOSIS — Z046 Encounter for general psychiatric examination, requested by authority: Secondary | ICD-10-CM | POA: Diagnosis not present

## 2019-07-15 DIAGNOSIS — F1910 Other psychoactive substance abuse, uncomplicated: Secondary | ICD-10-CM | POA: Diagnosis not present

## 2019-07-15 DIAGNOSIS — R05 Cough: Secondary | ICD-10-CM | POA: Insufficient documentation

## 2019-07-15 DIAGNOSIS — Z20822 Contact with and (suspected) exposure to covid-19: Secondary | ICD-10-CM | POA: Diagnosis not present

## 2019-07-15 DIAGNOSIS — F319 Bipolar disorder, unspecified: Secondary | ICD-10-CM | POA: Insufficient documentation

## 2019-07-15 DIAGNOSIS — F17210 Nicotine dependence, cigarettes, uncomplicated: Secondary | ICD-10-CM | POA: Diagnosis not present

## 2019-07-15 DIAGNOSIS — S91104D Unspecified open wound of right lesser toe(s) without damage to nail, subsequent encounter: Secondary | ICD-10-CM | POA: Insufficient documentation

## 2019-07-15 DIAGNOSIS — I10 Essential (primary) hypertension: Secondary | ICD-10-CM | POA: Diagnosis not present

## 2019-07-15 DIAGNOSIS — R4689 Other symptoms and signs involving appearance and behavior: Secondary | ICD-10-CM
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
## 2019-07-16 ENCOUNTER — Emergency Department (HOSPITAL_COMMUNITY): Payer: Medicare Other

## 2019-07-16 DIAGNOSIS — R456 Violent behavior: Secondary | ICD-10-CM | POA: Diagnosis not present

## 2019-07-16 LAB — COMPREHENSIVE METABOLIC PANEL
ALT: 13 U/L (ref 0–44)
AST: 18 U/L (ref 15–41)
Albumin: 3.3 g/dL — ABNORMAL LOW (ref 3.5–5.0)
Alkaline Phosphatase: 98 U/L (ref 38–126)
Anion gap: 11 (ref 5–15)
BUN: 14 mg/dL (ref 6–20)
CO2: 24 mmol/L (ref 22–32)
Calcium: 9.3 mg/dL (ref 8.9–10.3)
Chloride: 105 mmol/L (ref 98–111)
Creatinine, Ser: 0.91 mg/dL (ref 0.44–1.00)
GFR calc Af Amer: >60 mL/min (ref >60)
GFR calc non Af Amer: >60 mL/min (ref >60)
Glucose, Bld: 93 mg/dL (ref 70–99)
Potassium: 3.2 mmol/L — ABNORMAL LOW (ref 3.5–5.1)
Sodium: 140 mmol/L (ref 135–145)
Total Bilirubin: 0.7 mg/dL (ref 0.3–1.2)
Total Protein: 6.4 g/dL — ABNORMAL LOW (ref 6.5–8.1)

## 2019-07-16 LAB — RESPIRATORY PANEL BY RT PCR (FLU A&B, COVID)
Influenza A by PCR: NEGATIVE
Influenza B by PCR: NEGATIVE
SARS Coronavirus 2 by RT PCR: NEGATIVE

## 2019-07-16 LAB — RAPID URINE DRUG SCREEN, HOSP PERFORMED
Amphetamines: NOT DETECTED
Barbiturates: NOT DETECTED
Benzodiazepines: NOT DETECTED
Cocaine: POSITIVE — AB
Opiates: NOT DETECTED
Tetrahydrocannabinol: NOT DETECTED

## 2019-07-16 LAB — CBC
HCT: 37 % (ref 36.0–46.0)
Hemoglobin: 12.7 g/dL (ref 12.0–15.0)
MCH: 28.7 pg (ref 26.0–34.0)
MCHC: 34.3 g/dL (ref 30.0–36.0)
MCV: 83.7 fL (ref 80.0–100.0)
Platelets: 367 10*3/uL (ref 150–400)
RBC: 4.42 MIL/uL (ref 3.87–5.11)
RDW: 18.3 % — ABNORMAL HIGH (ref 11.5–15.5)
WBC: 9.3 10*3/uL (ref 4.0–10.5)
nRBC: 0 % (ref 0.0–0.2)

## 2019-07-16 LAB — ACETAMINOPHEN LEVEL: Acetaminophen (Tylenol), Serum: <10 ug/mL — ABNORMAL LOW (ref 10–30)

## 2019-07-16 LAB — I-STAT BETA HCG BLOOD, ED (MC, WL, AP ONLY): I-stat hCG, quantitative: <5 m[IU]/mL (ref <5)

## 2019-07-16 LAB — ETHANOL: Alcohol, Ethyl (B): <10 mg/dL (ref <10)

## 2019-07-16 LAB — SALICYLATE LEVEL: Salicylate Lvl: <7 mg/dL — ABNORMAL LOW (ref 7.0–30.0)

## 2019-07-16 MED ORDER — POTASSIUM CHLORIDE CRYS ER 20 MEQ PO TBCR
40.0000 meq | EXTENDED_RELEASE_TABLET | Freq: Once | ORAL | Status: DC
  Filled 2019-07-16 (×2): qty 2

## 2019-07-16 MED ORDER — IBUPROFEN 800 MG PO TABS
800.0000 mg | ORAL_TABLET | Freq: Once | ORAL | Status: AC
  Administered 2019-07-16: 800 mg via ORAL
  Filled 2019-07-16: qty 1

## 2019-07-16 MED ORDER — IBUPROFEN 800 MG PO TABS
800.0000 mg | ORAL_TABLET | Freq: Once | ORAL | Status: AC
  Administered 2019-07-16: 06:00:00 800 mg via ORAL
  Filled 2019-07-16: qty 1

## 2019-07-16 NOTE — ED Notes (Signed)
Pt refused vital signs recheck.

## 2019-07-16 NOTE — ED Notes (Signed)
Crutches provided. Post op shoe placed on R foot. Pt remains on cart in NAD. Breathing easy, non-labored. Alert, speaking in full sentences. Will continue to monitor. Remains in continuous view of RN station

## 2019-07-16 NOTE — BHH Counselor (Signed)
Clinician spoke to [place] at [facility] and noted the fax failed on their end. [place] to refax pt's IVC paperwork.     Vertell Novak, MS, [facility], [facility] Triage Specialist [phone].

## 2019-07-16 NOTE — BHH Counselor (Signed)
Clinician still waiting on pt's IVC paperwork. Once paperwork is received and reviewed clinician to call TTS cart.    Vertell Novak, MS, [facility], [facility] Triage Specialist [phone].

## 2019-07-16 NOTE — ED Notes (Signed)
Warm blankets, soda, and sandwich provided to pt

## 2019-07-16 NOTE — BHH Counselor (Signed)
Clinician spoke to Mitzi Hansen, RN to express she is ready to complete pt's assessment once pt's IVC paperwork is received and the pt is in a private room. Clinician provided fax number [phone]). Mitzi Hansen, RN asked clinician to call TTS cart at [phone].   Vertell Novak, MS, [facility], [facility] Triage Specialist [phone].

## 2019-07-16 NOTE — ED Notes (Signed)
Pt taken to xray in NAD

## 2019-07-16 NOTE — ED Notes (Signed)
Care endorsed to Michele, RN 
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
## 2019-07-16 NOTE — ED Notes (Signed)
Ibuprofen given per MAR. Name/DOB verified with pt

## 2019-07-16 NOTE — ED Notes (Signed)
Sitter at bedside.
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
## 2019-07-16 NOTE — ED Notes (Signed)
Pt completed second TTS, sitter at bedside.  Phone call received to this RN from Psych clearing pt for DC.  MD notified.  Pt reports that she is unable ot swallow K+ tabs and when split she has emesis.  Will prepare for DC.

## 2019-07-16 NOTE — ED Notes (Signed)
Pt back from xray without incidence
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
## 2019-07-16 NOTE — ED Notes (Signed)
Breakfast ordered 
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
## 2019-07-16 NOTE — ED Notes (Addendum)
Pt ambulatory to and from [facility]. Pt c/o HA pain. EDP at bedside

## 2019-07-16 NOTE — ED Notes (Signed)
covid swab collected, labeled with 2 pt identifiers, and brought to lab 

## 2019-07-16 NOTE — Progress Notes (Signed)
Pt has been psych cleared. Outpatient resources faxed to [facility] ED.   Audree Camel, LCSW, [place] Disposition [facility]/TTS [phone]

## 2019-07-25 ENCOUNTER — Encounter (HOSPITAL_BASED_OUTPATIENT_CLINIC_OR_DEPARTMENT_OTHER): Payer: Medicare Other | Admitting: Physician Assistant

## 2019-08-02 ENCOUNTER — Ambulatory Visit (HOSPITAL_BASED_OUTPATIENT_CLINIC_OR_DEPARTMENT_OTHER): Payer: Medicare Other | Admitting: Internal Medicine

## 2019-08-13 ENCOUNTER — Encounter (HOSPITAL_BASED_OUTPATIENT_CLINIC_OR_DEPARTMENT_OTHER): Payer: Medicare Other | Attending: Physician Assistant | Admitting: Internal Medicine

## 2019-09-15 IMAGING — DX DG CHEST 1V PORT
1 series · 1 of 1 positions shown · non-contrast
Comparison: Chest radiograph dated 09/13/2017

CLINICAL DATA: 43-year-old female with shortness of breath and
cough.

EXAM:
PORTABLE CHEST 1 VIEW

[chest ap]
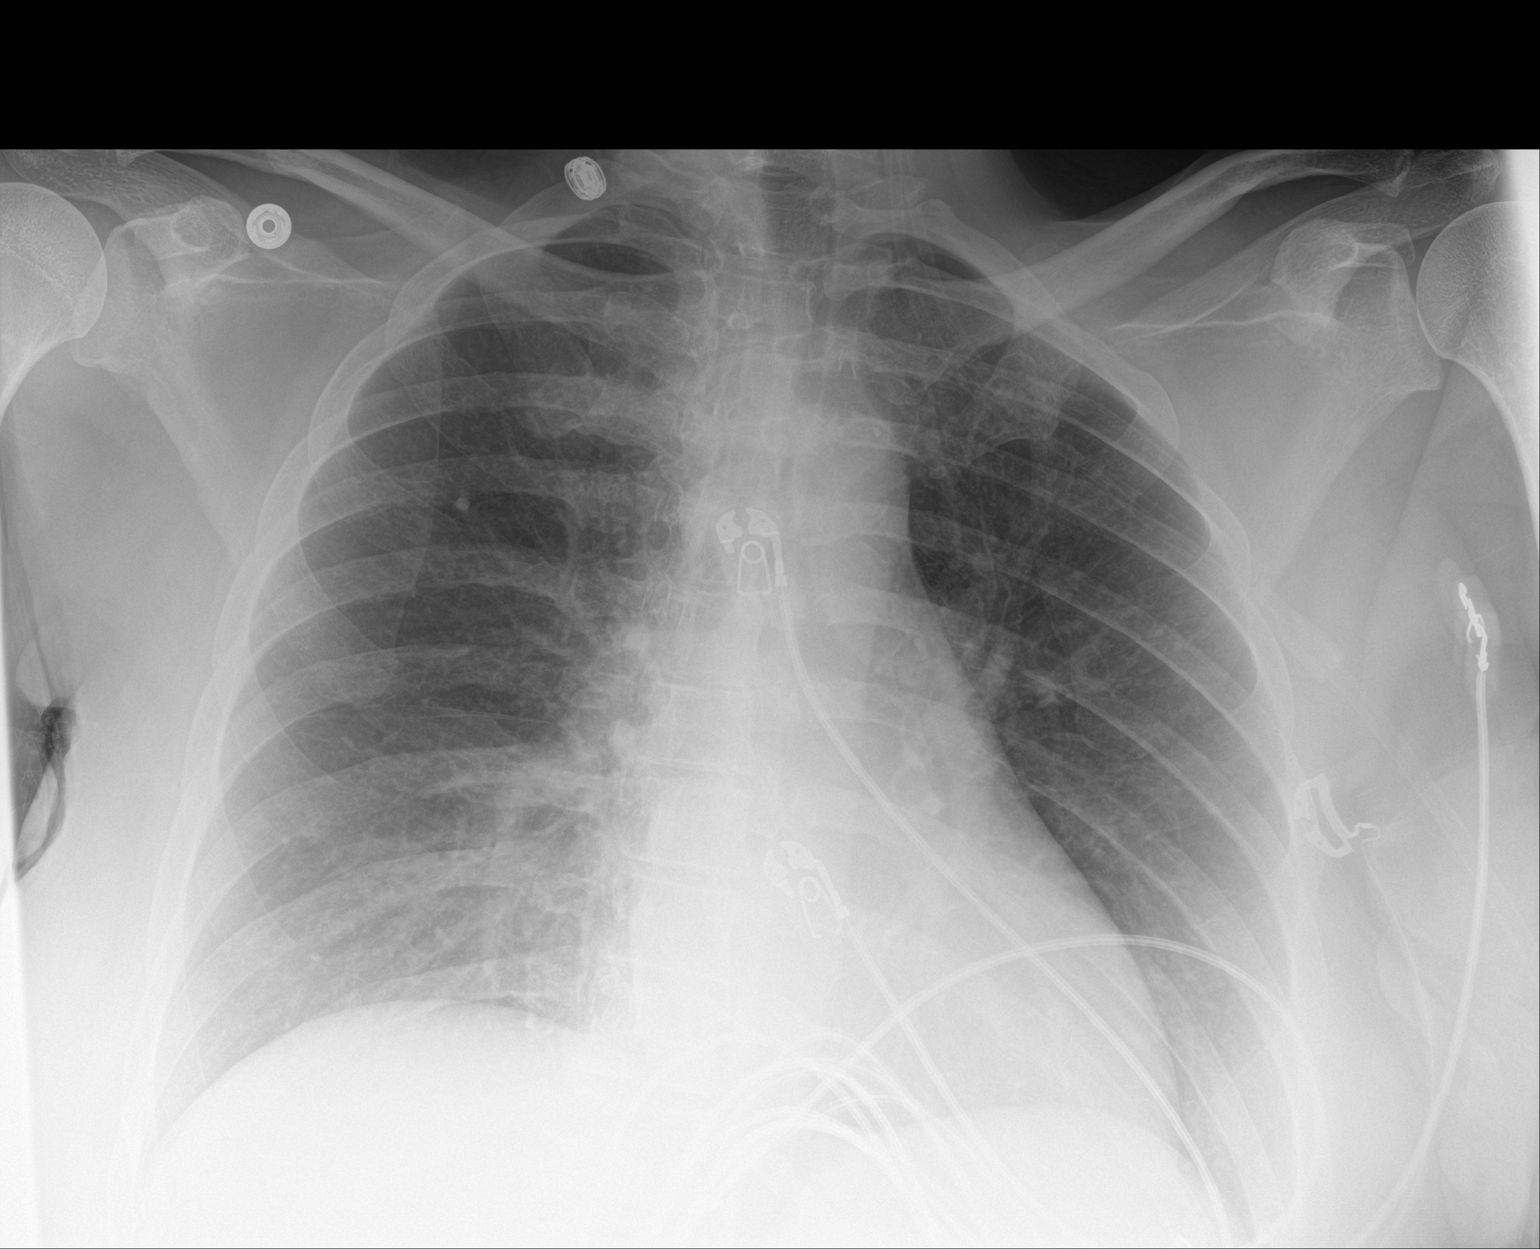

[1 of 1 positions shown; findings below may reference images not displayed]

FINDINGS: The lungs are clear. There is no pleural effusion or pneumothorax.
Stable cardiac silhouette. No acute osseous pathology. The
IMPRESSION: No active disease.

## 2019-09-15 IMAGING — CT CT ABD-PELV W/O CM
2 of 4 series · 16 of 46 positions shown, 18 images · non-contrast
Comparison: CT 04/06/2018

CLINICAL DATA: Shortness of breath, cough and body aches for 3
days, septic with abdominal and back pain

EXAM:
CT ABDOMEN AND PELVIS WITHOUT CONTRAST
TECHNIQUE: Multidetector CT imaging of the abdomen and pelvis was performed
following the standard protocol without IV contrast.

[Series 2: axial st · axial · 0.78mm/px · z∈[-502,-82]mm · 13 of 94 slices shown, 15 images]
[im 5/94  soft-tissue]
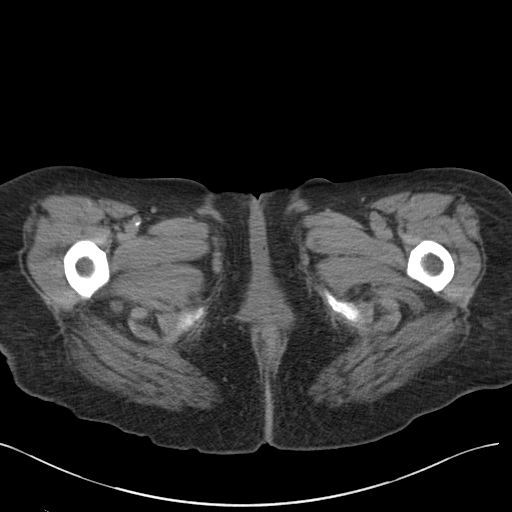
[im 5/94  bone]
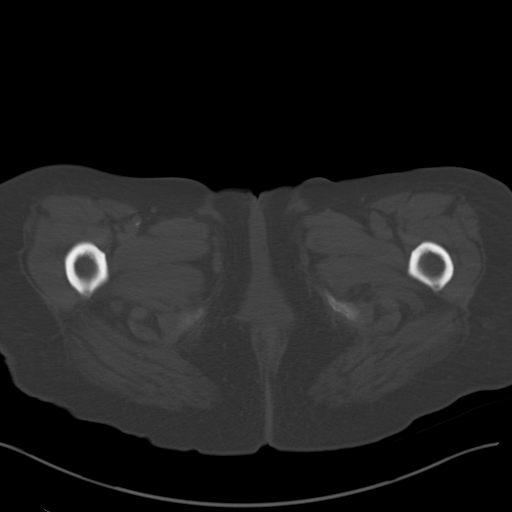
[im 14/94  soft-tissue]
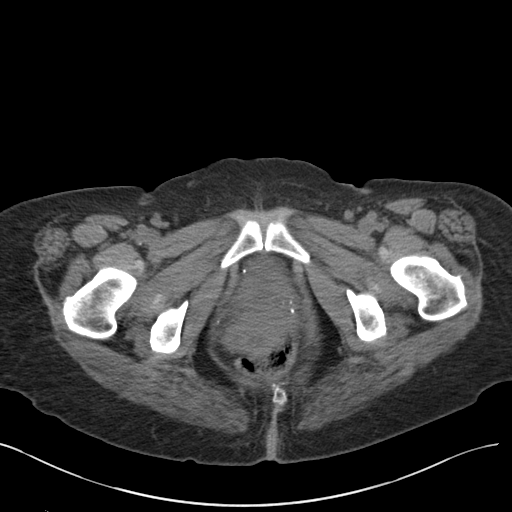
[im 19/94  soft-tissue]
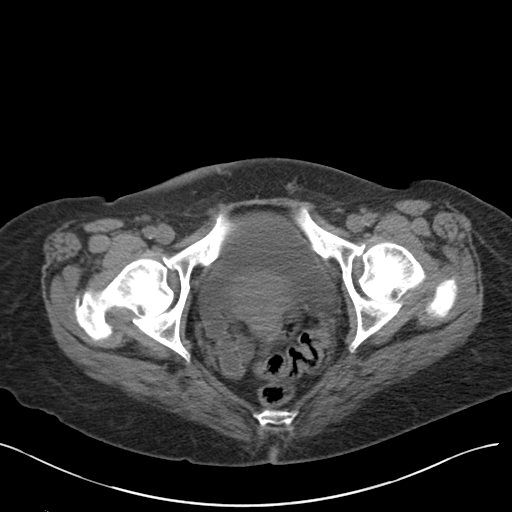
[im 28/94  soft-tissue]
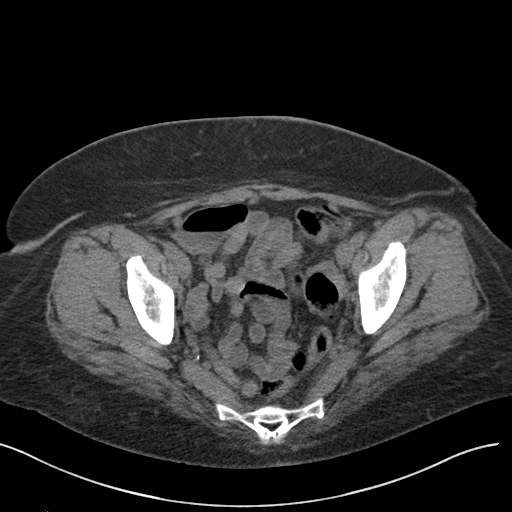
[im 33/94  soft-tissue]
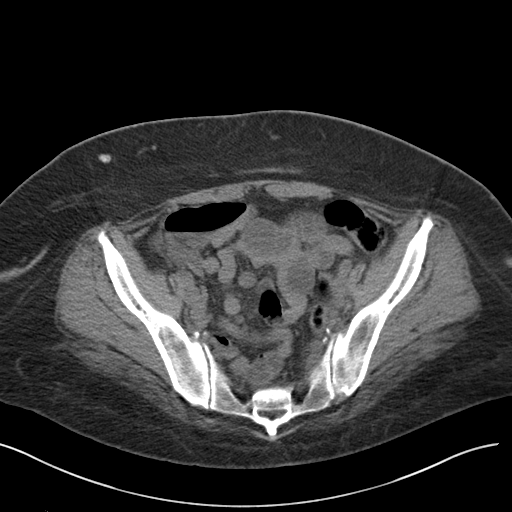
[im 42/94  soft-tissue]
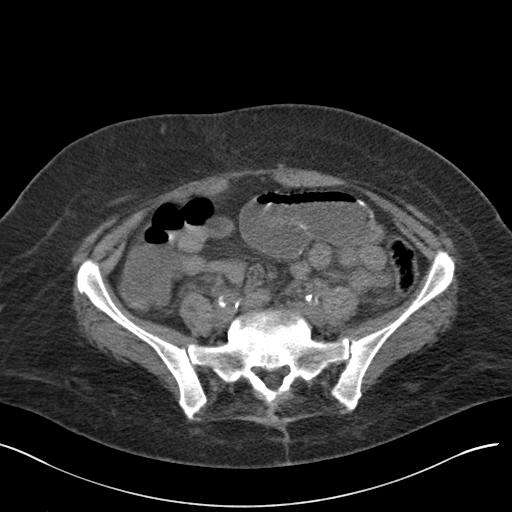
[im 47/94  soft-tissue]
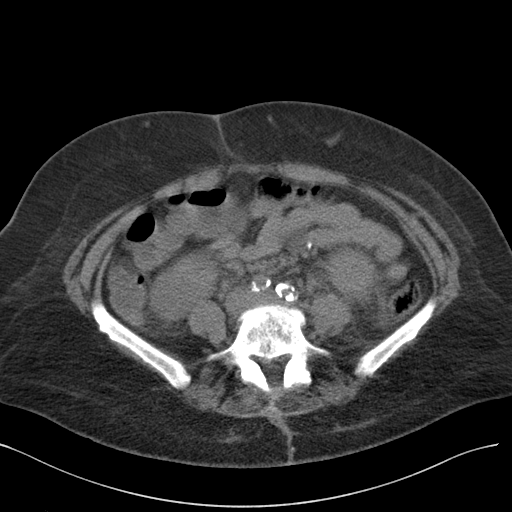
[im 52/94  soft-tissue]
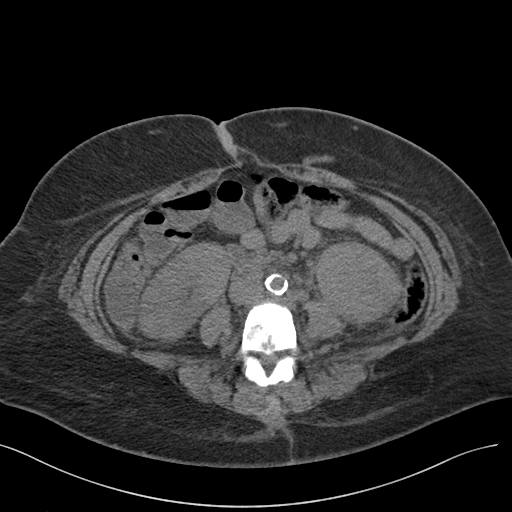
[im 61/94  soft-tissue]
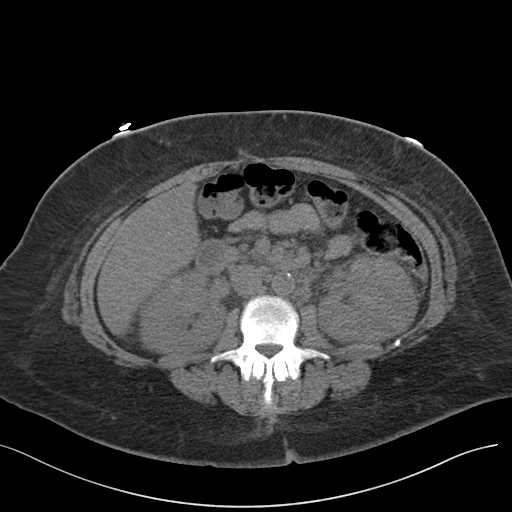
[im 61/94  bone]
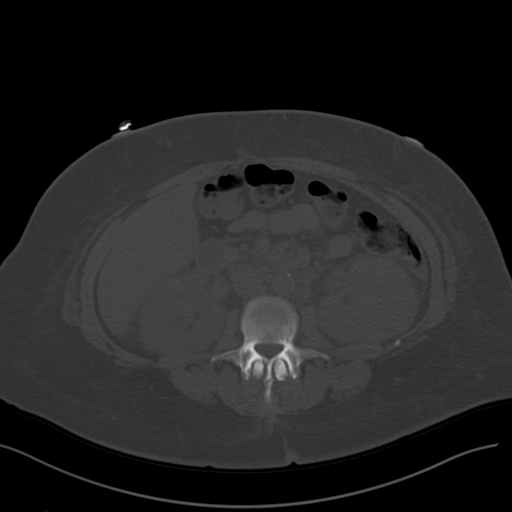
[im 66/94  soft-tissue]
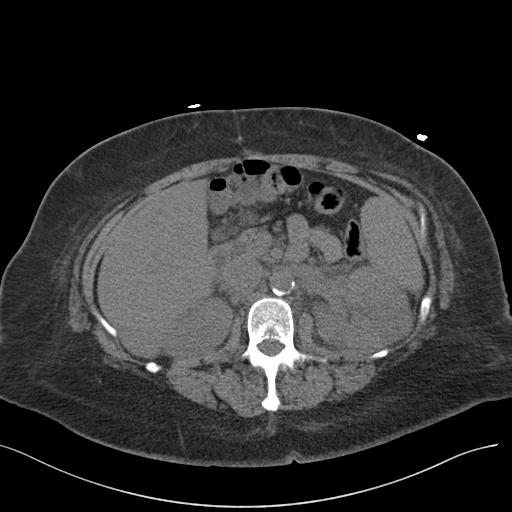
[im 75/94  soft-tissue]
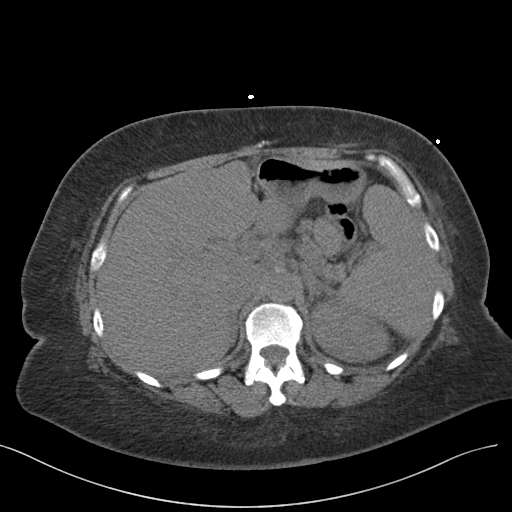
[im 80/94  soft-tissue]
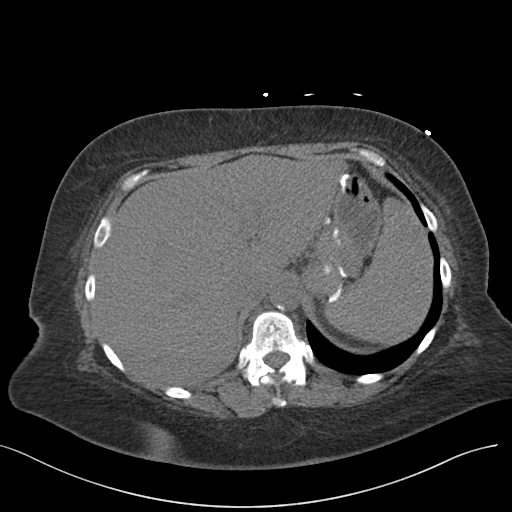
[im 89/94  soft-tissue]
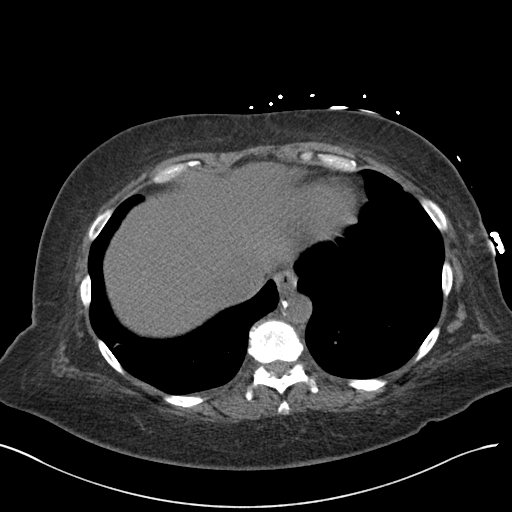

[Series 5: coronal st · coronal · 0.95mm/px · 3 of 149 slices shown]
[im 50/149  soft-tissue]
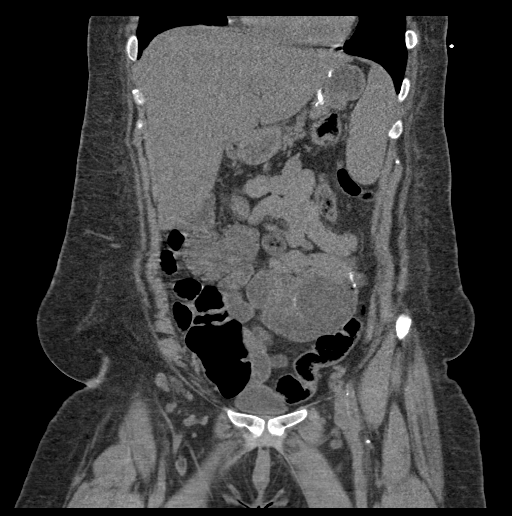
[im 66/149  soft-tissue]
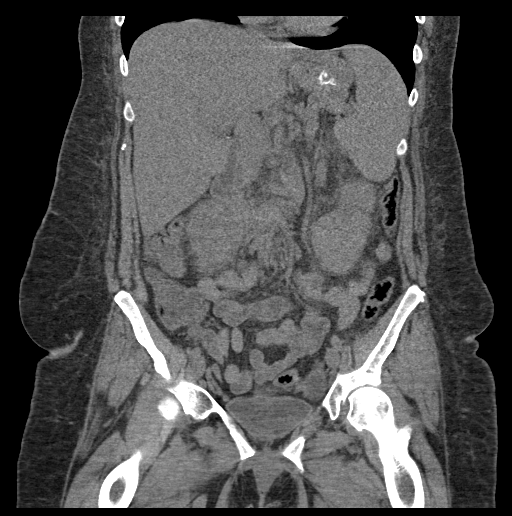
[im 83/149  soft-tissue]
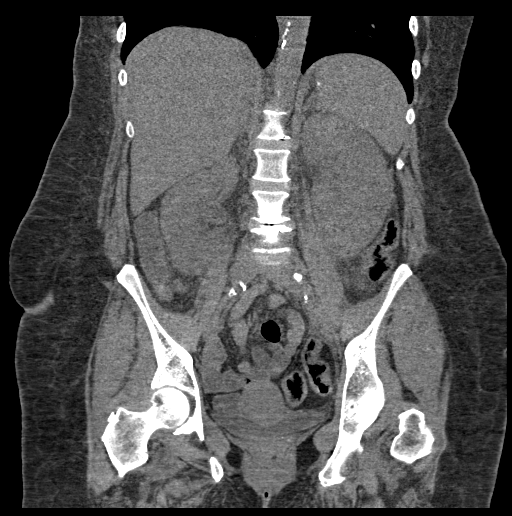

[16 of 46 positions shown; findings below may reference images not displayed]

FINDINGS: Lower chest: Bandlike areas of basilar atelectasis and/or scarring.
Normal heart size. No pericardial effusion.

Hepatobiliary: No focal liver abnormality is seen. Patient is post
cholecystectomy. Slight prominence of the biliary tree likely
related to reservoir effect. No calcified intraductal gallstones.

Pancreas: Unremarkable. No pancreatic ductal dilatation or
surrounding inflammatory changes.

Spleen: Normal in size without focal abnormality.

Adrenals/Urinary Tract: Normal adrenal glands. There is bilateral
renal enlargement with a heterogeneous attenuation and significant
surrounding stranding. No hydronephrosis or obstructive
urolithiasis. No visible or contour deforming renal lesions. Urinary
bladder is circumferentially thickened.

Stomach/Bowel: Patient is post gastric bypass with continued
decrease in the amount of material within the excluded portion of
the gastric pouch. There is focal dilatation of a small bowel loop
adjacent the distal small bowel anastomosis up to 4.3 cm however the
adjacent bowel loops are more normal caliber and there is passage of
material beyond the level of the anastomosis. No other small bowel
dilatation or mural thickening. A normal appendix is visualized. No
colonic dilatation or wall thickening.

Vascular/Lymphatic: Atherosclerotic plaque within the normal caliber
aorta. Further evaluation the vessels is limited by absence of
contrast. No suspicious or enlarged lymph nodes in the included
lymphatic chains.

Reproductive: Normal appearance of the uterus and adnexal
structures. Bilateral ligation clips are noted.

Other: Extensive perinephric stranding, as above. No free fluid or
free gas. No bowel containing hernia. Postsurgical changes are seen
in the upper anterior midline abdomen.

Musculoskeletal: Multilevel degenerative changes are present in the
imaged portions of the spine. Features are most pronounced at L3-4
and L4-5 levels. Postsurgical changes from prior posterior spinal
decompression. Partial fragmentation of the L3 spinous process.
IMPRESSION: 1. Bilateral renal enlargement with a heterogeneous attenuation and
significant surrounding stranding with circumferentially thickened
bladder wall suggesting an ascending urinary tract infection with
pyelonephritis.
2. Focal dilatation of a small bowel loop adjacent the distal small
bowel anastomosis however the adjacent bowel loops are more normal
caliber and there is passage of material beyond the level of the
anastomosis. Findings could reflect normal postsurgical denervation
of the bowel loop, focal ileus or partial obstruction. Correlation
with symptoms is recommended.
3. Aortic Atherosclerosis ([id number].P).

## 2019-09-18 IMAGING — DX DG CHEST 2V
2 series · 2 of 2 positions shown · non-contrast
Comparison: February 02, 2019

CLINICAL DATA: 43-year-old female with a history of shortness of
breath

EXAM:
CHEST - 2 VIEW

[chest pa]
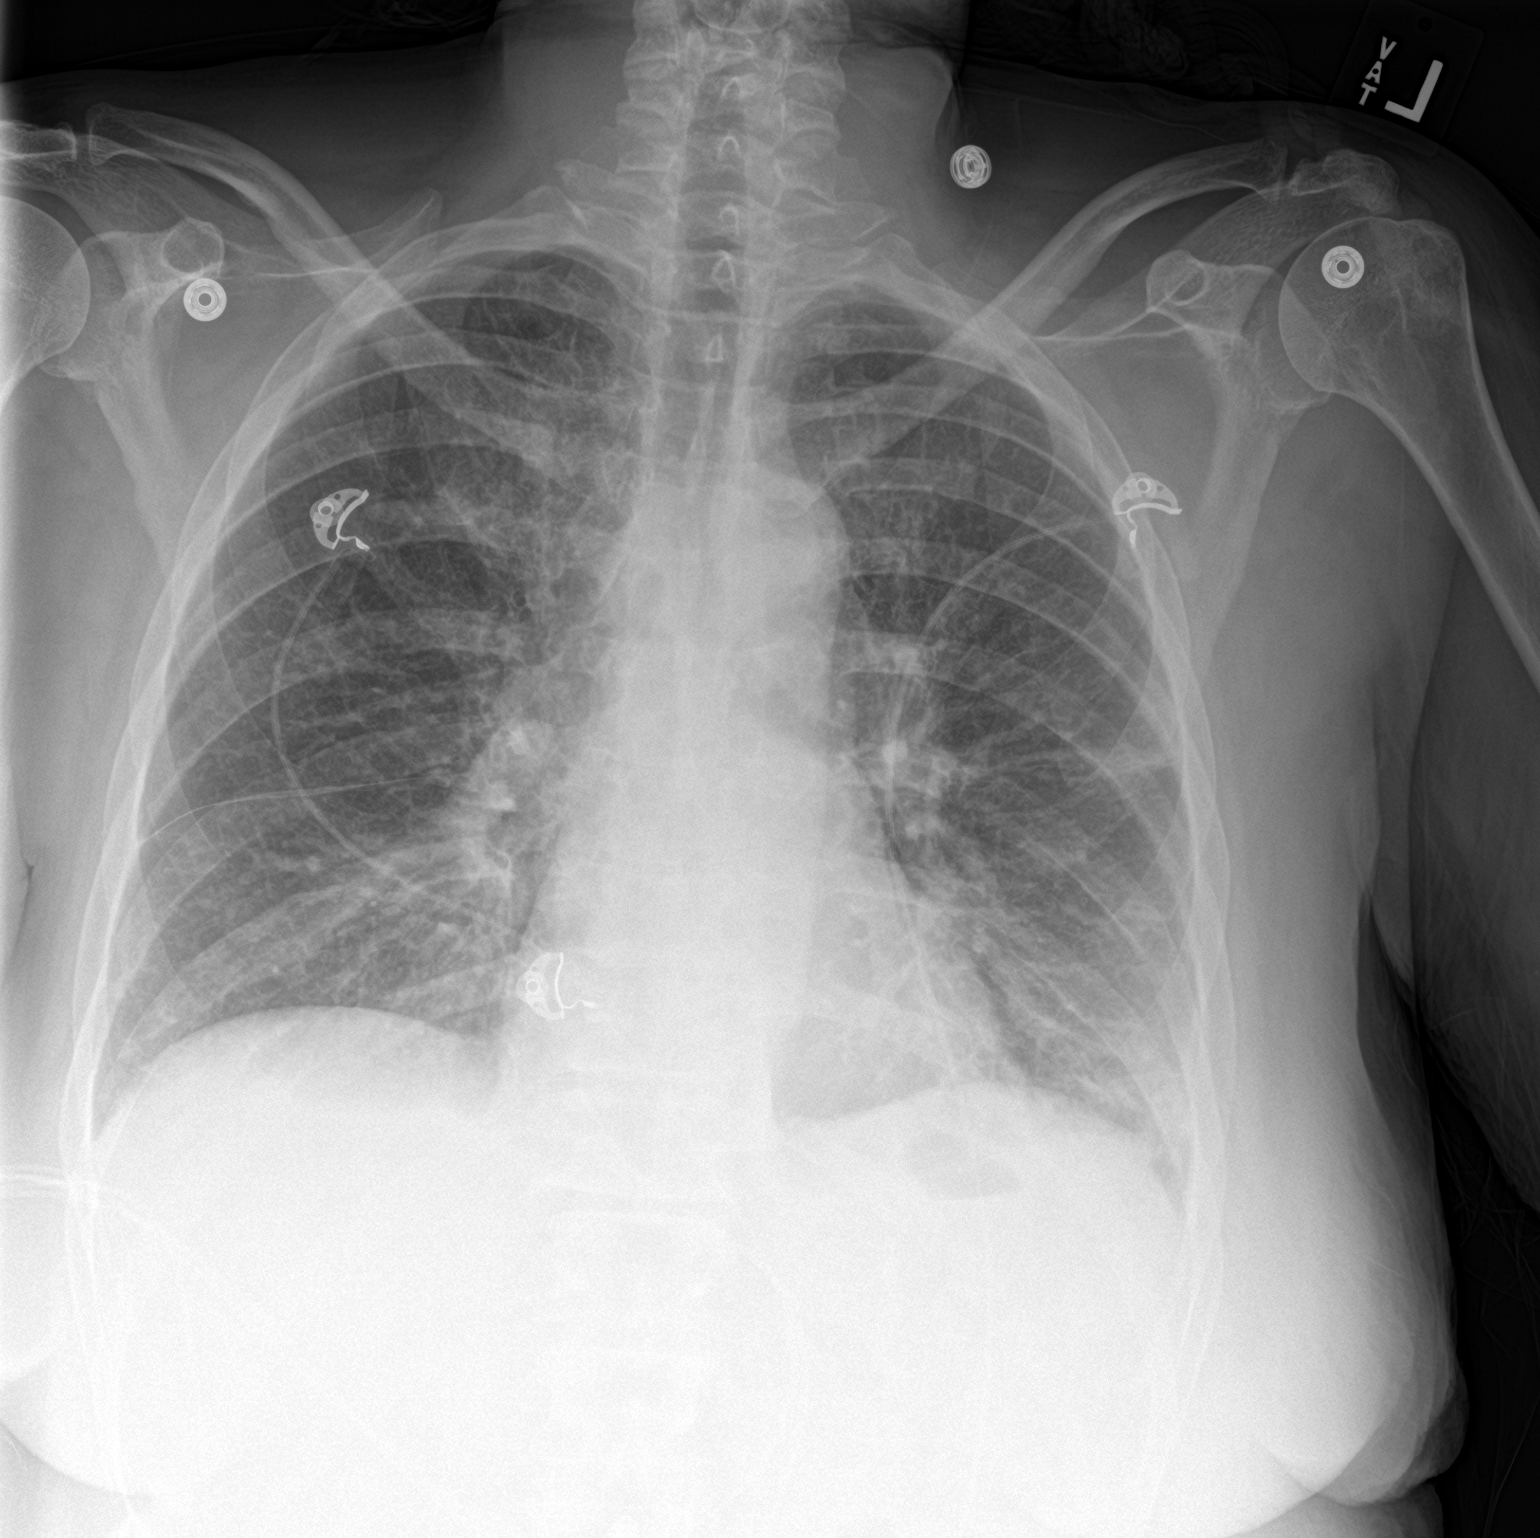

[chest lat]
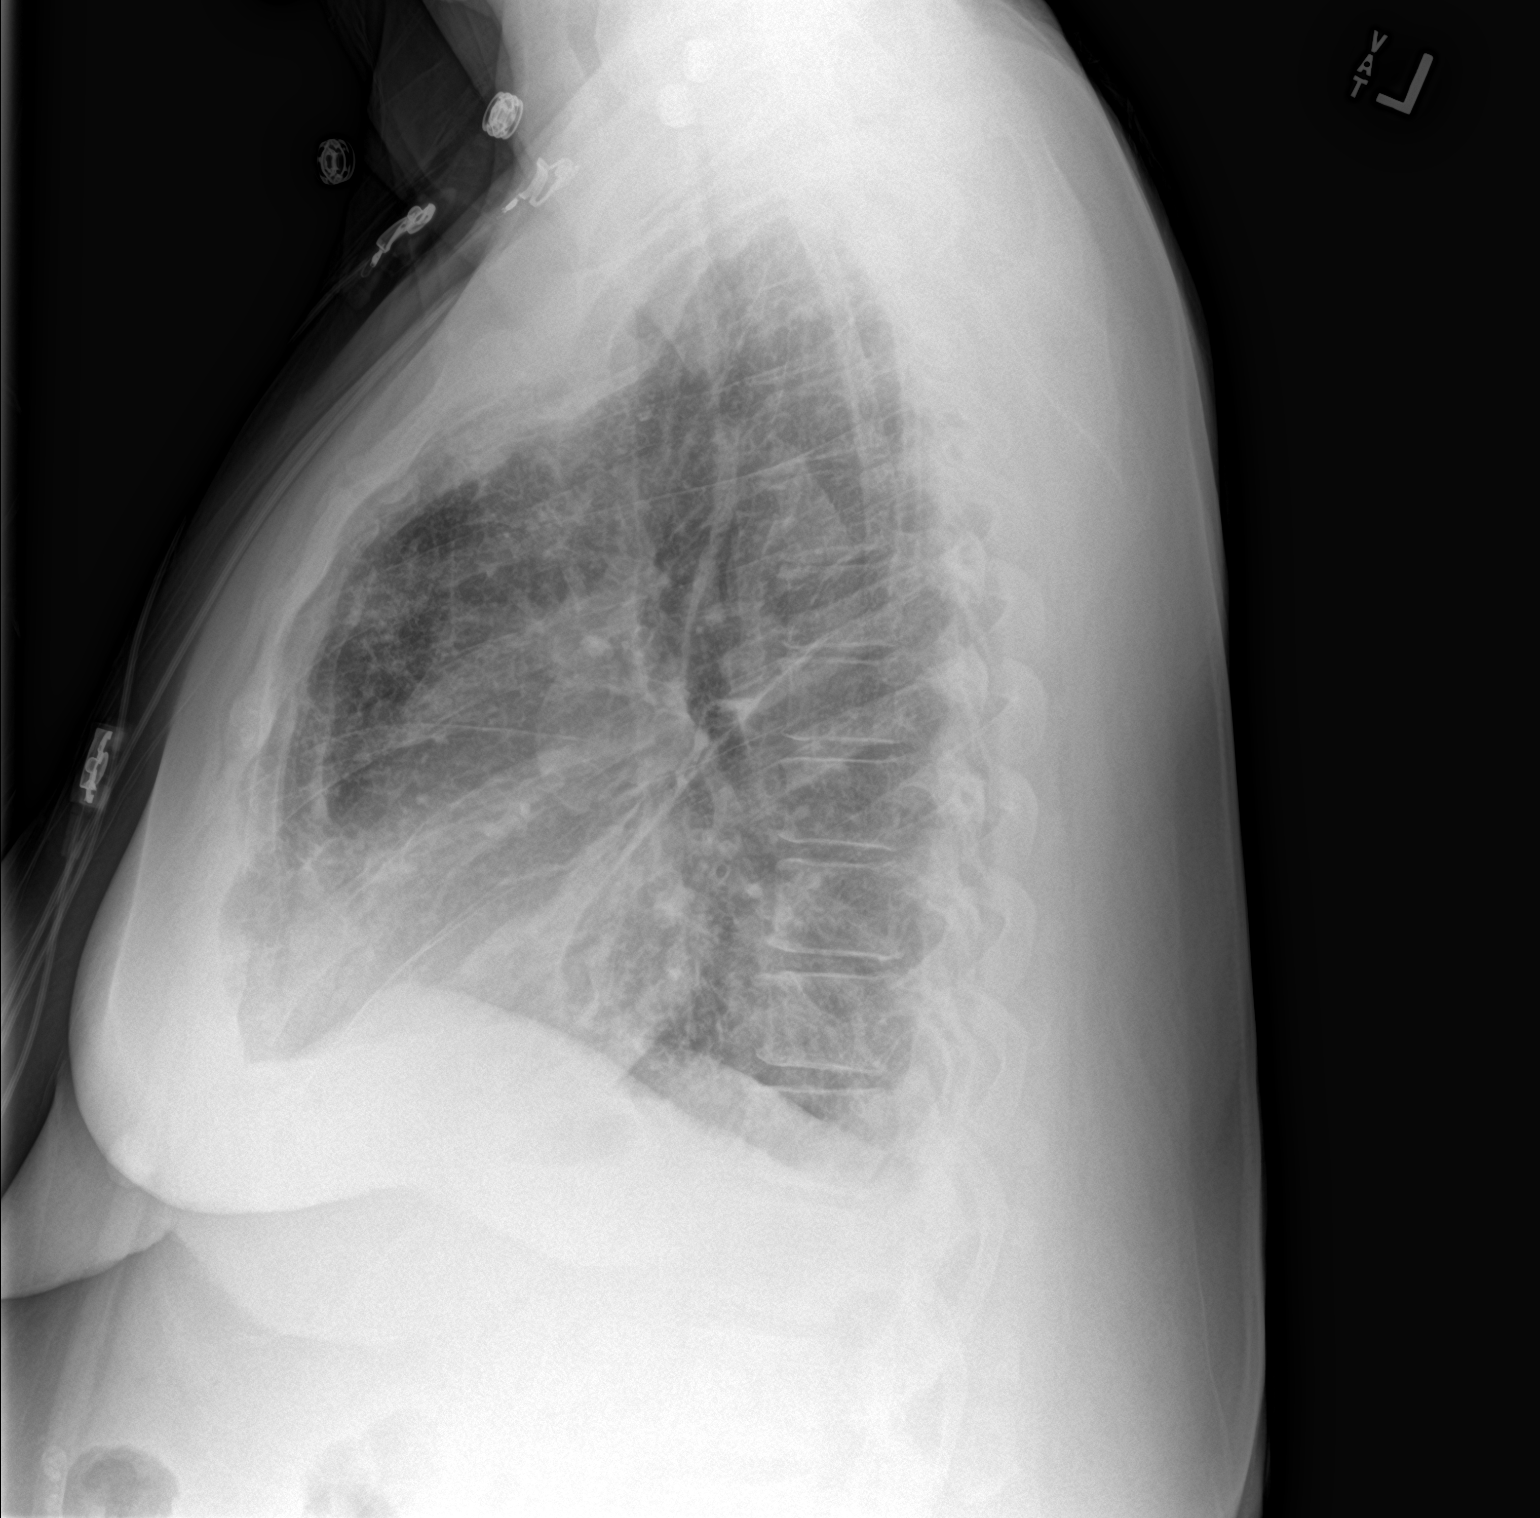

[2 of 2 positions shown; findings below may reference images not displayed]

FINDINGS: Cardiomediastinal silhouette unchanged in size and contour. New
interlobular septal thickening compared to the prior with linear
opacities of the bilateral lungs and thickening of the minor
fissure. No pneumothorax. No pleural effusion. No confluent airspace
disease.
IMPRESSION: Evidence of early pulmonary edema.

## 2019-09-21 IMAGING — CT CT ABD-PELV W/ CM
2 of 5 series · 15 of 46 positions shown, 17 images · IV contrast (omnipaque)
Comparison: 02/02/2019, 04/02/2018, 09/13/2017

CLINICAL DATA: Fever and worsening elevated white blood cell count

EXAM:
CT ABDOMEN AND PELVIS WITH CONTRAST
TECHNIQUE: Multidetector CT imaging of the abdomen and pelvis was performed
using the standard protocol following bolus administration of
intravenous contrast.
CONTRAST:  100 mL Omnipaque 300

[Series 2: axial st · axial · 0.89mm/px · z∈[+1037,+1452]mm · 12 of 99 slices shown, 14 images]
[im 8/99  soft-tissue]
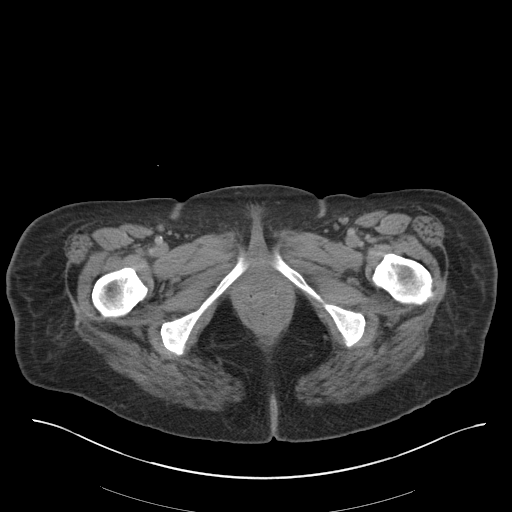
[im 8/99  bone]
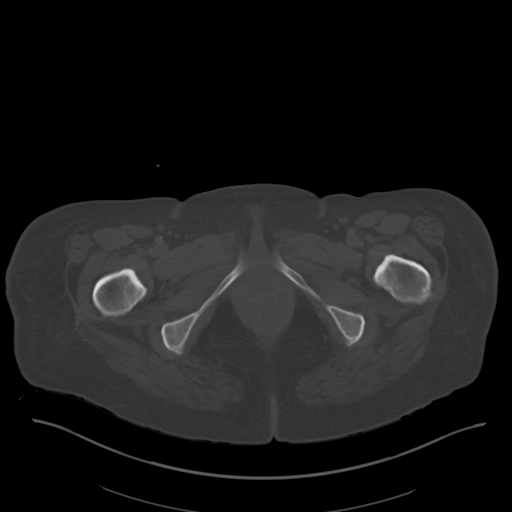
[im 16/99  soft-tissue]
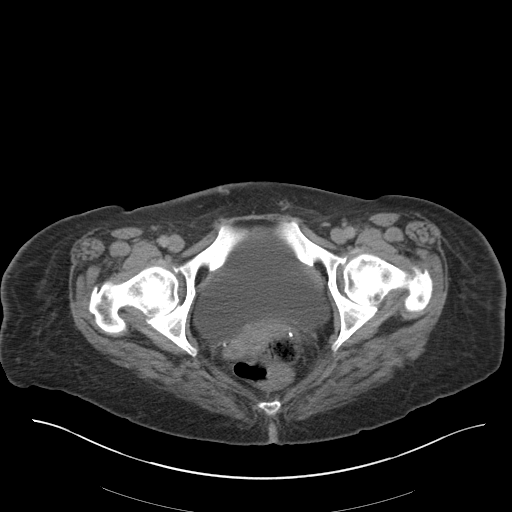
[im 23/99  soft-tissue]
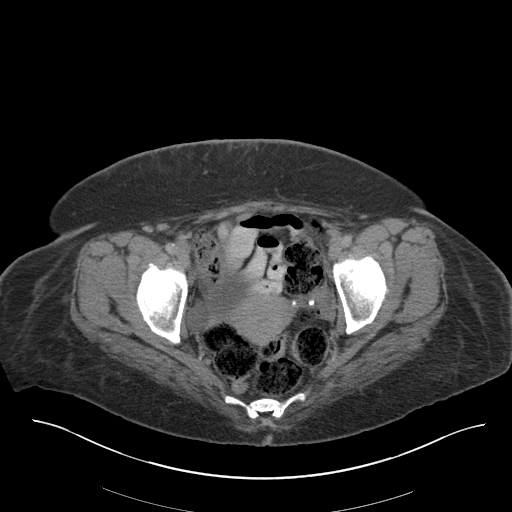
[im 31/99  soft-tissue]
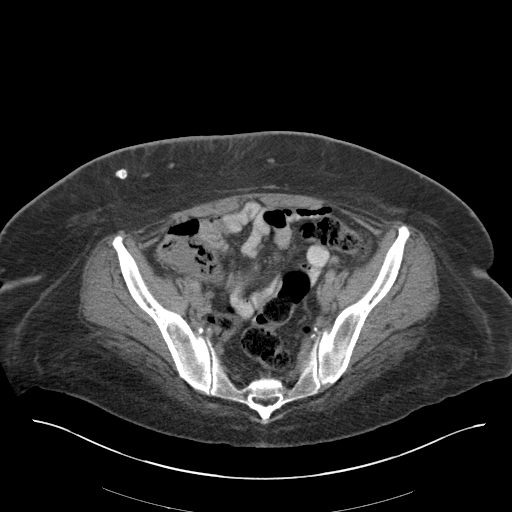
[im 38/99  soft-tissue]
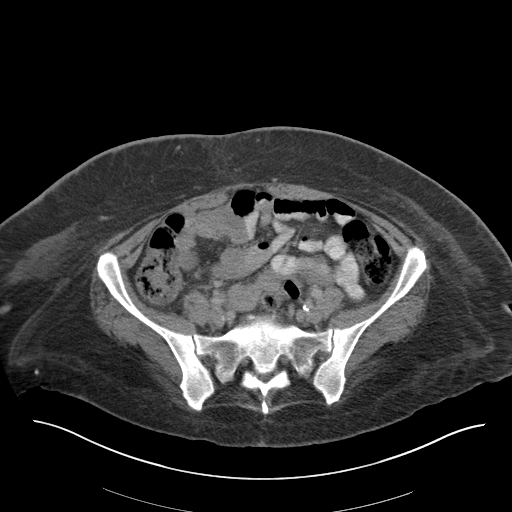
[im 46/99  soft-tissue]
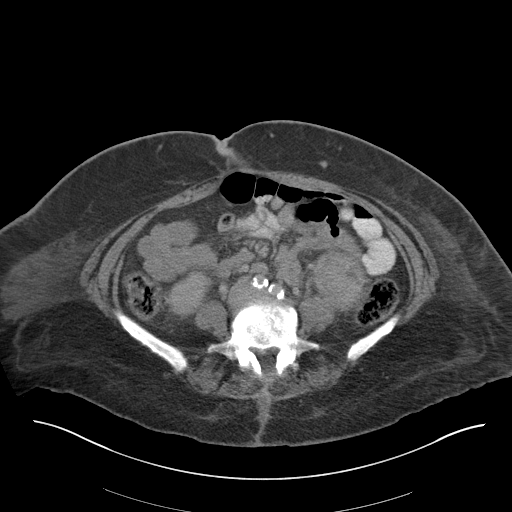
[im 53/99  soft-tissue]
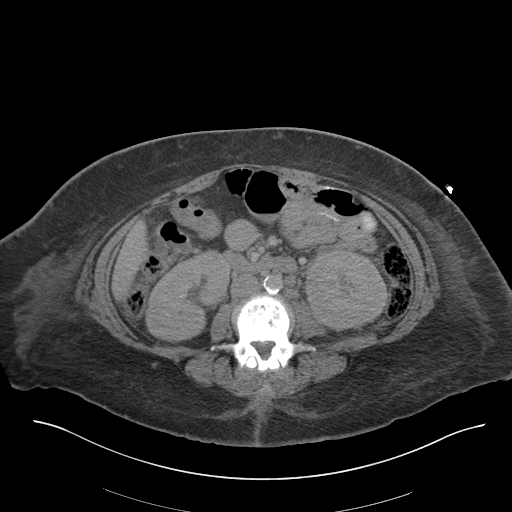
[im 61/99  soft-tissue]
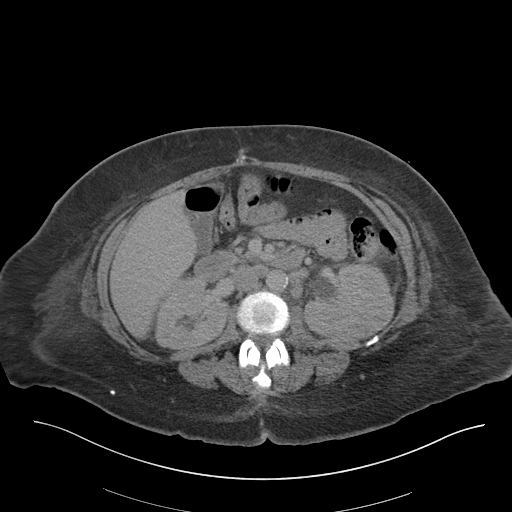
[im 68/99  soft-tissue]
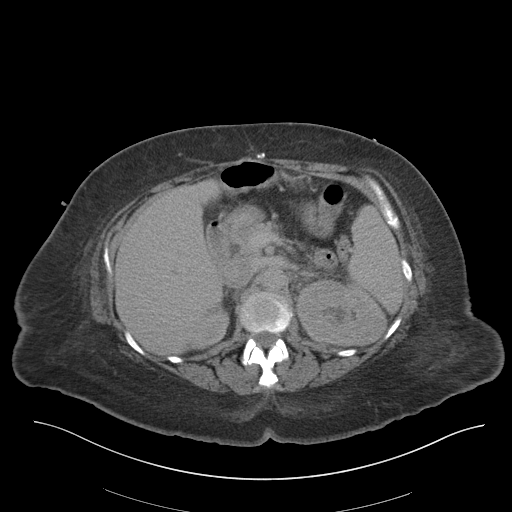
[im 68/99  bone]
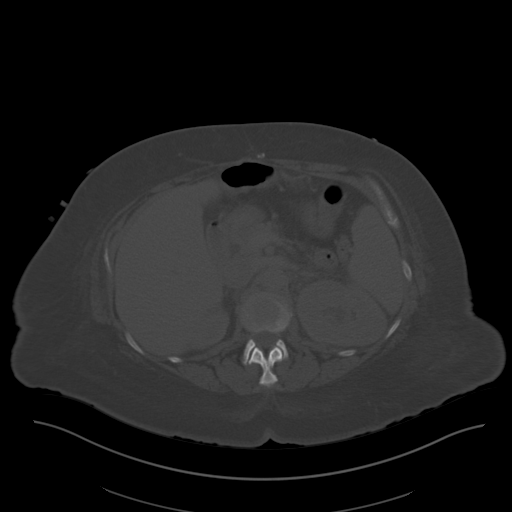
[im 76/99  soft-tissue]
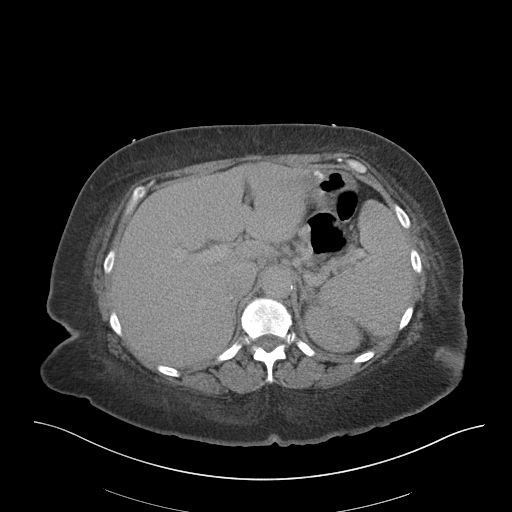
[im 83/99  soft-tissue]
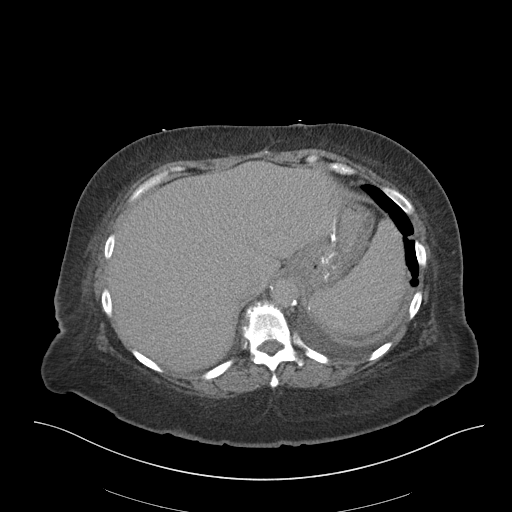
[im 91/99  soft-tissue]
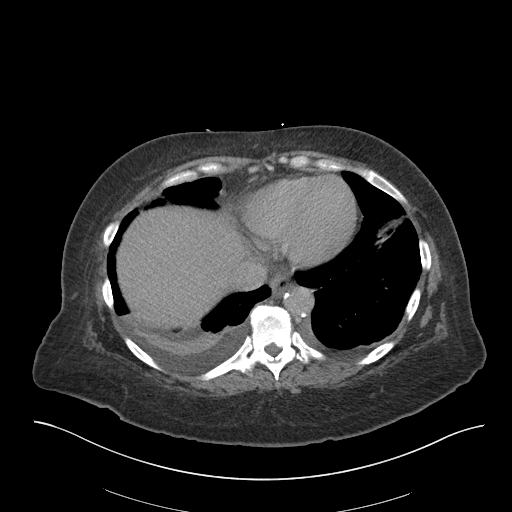

[Series 5: coronal st · coronal · 0.78mm/px · 3 of 146 slices shown]
[im 49/146  soft-tissue]
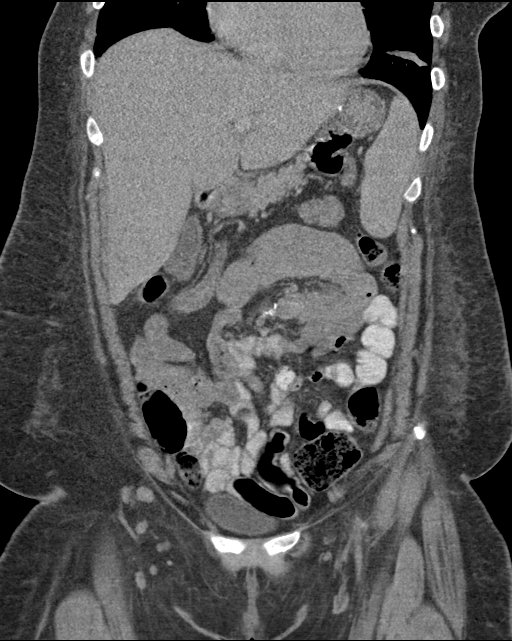
[im 65/146  soft-tissue]
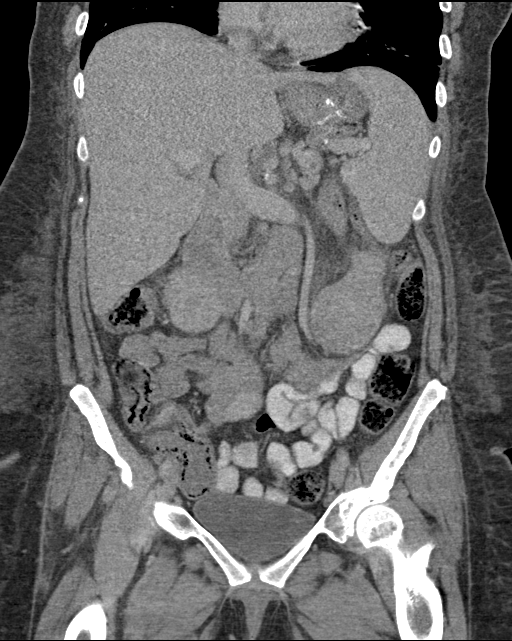
[im 81/146  soft-tissue]
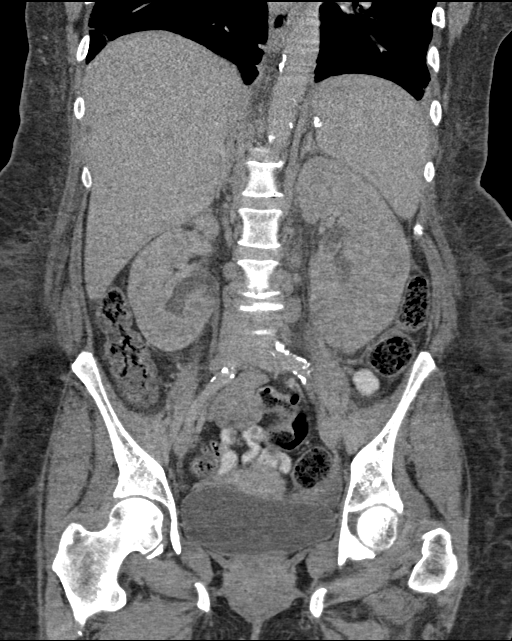

[15 of 46 positions shown; findings below may reference images not displayed]

FINDINGS: Lower chest: Bilateral pleural effusions are noted right greater
than left with associated bibasilar atelectatic changes. No focal
confluent infiltrate is seen. No parenchymal nodules are noted.

Hepatobiliary: No focal liver abnormality is seen. Status post
cholecystectomy. No biliary dilatation.

Pancreas: Unremarkable. No pancreatic ductal dilatation or
surrounding inflammatory changes.

Spleen: Normal in size without focal abnormality.

Adrenals/Urinary Tract: Adrenal glands are within normal limits
bilaterally. Kidneys demonstrate fullness of the collecting systems
with irregular enhancement pattern similar to the density difference
is noted on the prior exam consistent with pyelonephritis. Delayed
images demonstrate mild excretion of contrast material. No
definitive calculi are seen. The bladder is well distended. There is
an enhancing nodule identified in the posterior aspect of the
bladder just to the right of the midline similar to that seen on the
prior exam from 4748. given its stability this is likely benign in
etiology.

Stomach/Bowel: Colon demonstrates scattered fecal material
throughout. No obstructive changes are seen. The appendix is
air-filled and within normal limits. No small bowel abnormality is
seen. Postsurgical changes consistent with gastric bypass are noted.

Vascular/Lymphatic: Aortic atherosclerosis. No enlarged abdominal or
pelvic lymph nodes.

Reproductive: Uterus and bilateral adnexa are unremarkable.

Other: No abdominal wall hernia or abnormality. No abdominopelvic
ascites.

Musculoskeletal: Mild degenerative changes of lumbar spine are
noted.
IMPRESSION: Changes consistent with bilateral pyelonephritis better delineated
on the current exam due to the administration of contrast material.
Slight increase in fullness of the renal collecting systems is noted
when compared with the prior study although no definitive
obstructing lesions are seen. No abscess is identified.

Bilateral pleural effusions right greater than left with associated
atelectatic changes.

Stable enhancing nodule along the posterior aspect of the bladder
just to the right of the midline unchanged from 4748.

## 2020-01-17 ENCOUNTER — Ambulatory Visit: Payer: Medicare Other | Admitting: Gastroenterology

## 2020-02-03 IMAGING — CR DG FOOT COMPLETE 3+V*R*
3 series · 3 of 3 positions shown · non-contrast
Comparison: None.

CLINICAL DATA: Right foot pain radiating to right lower leg
beginning yesterday. No injury.

EXAM:
RIGHT FOOT COMPLETE - 3+ VIEW

[x foot ap right]
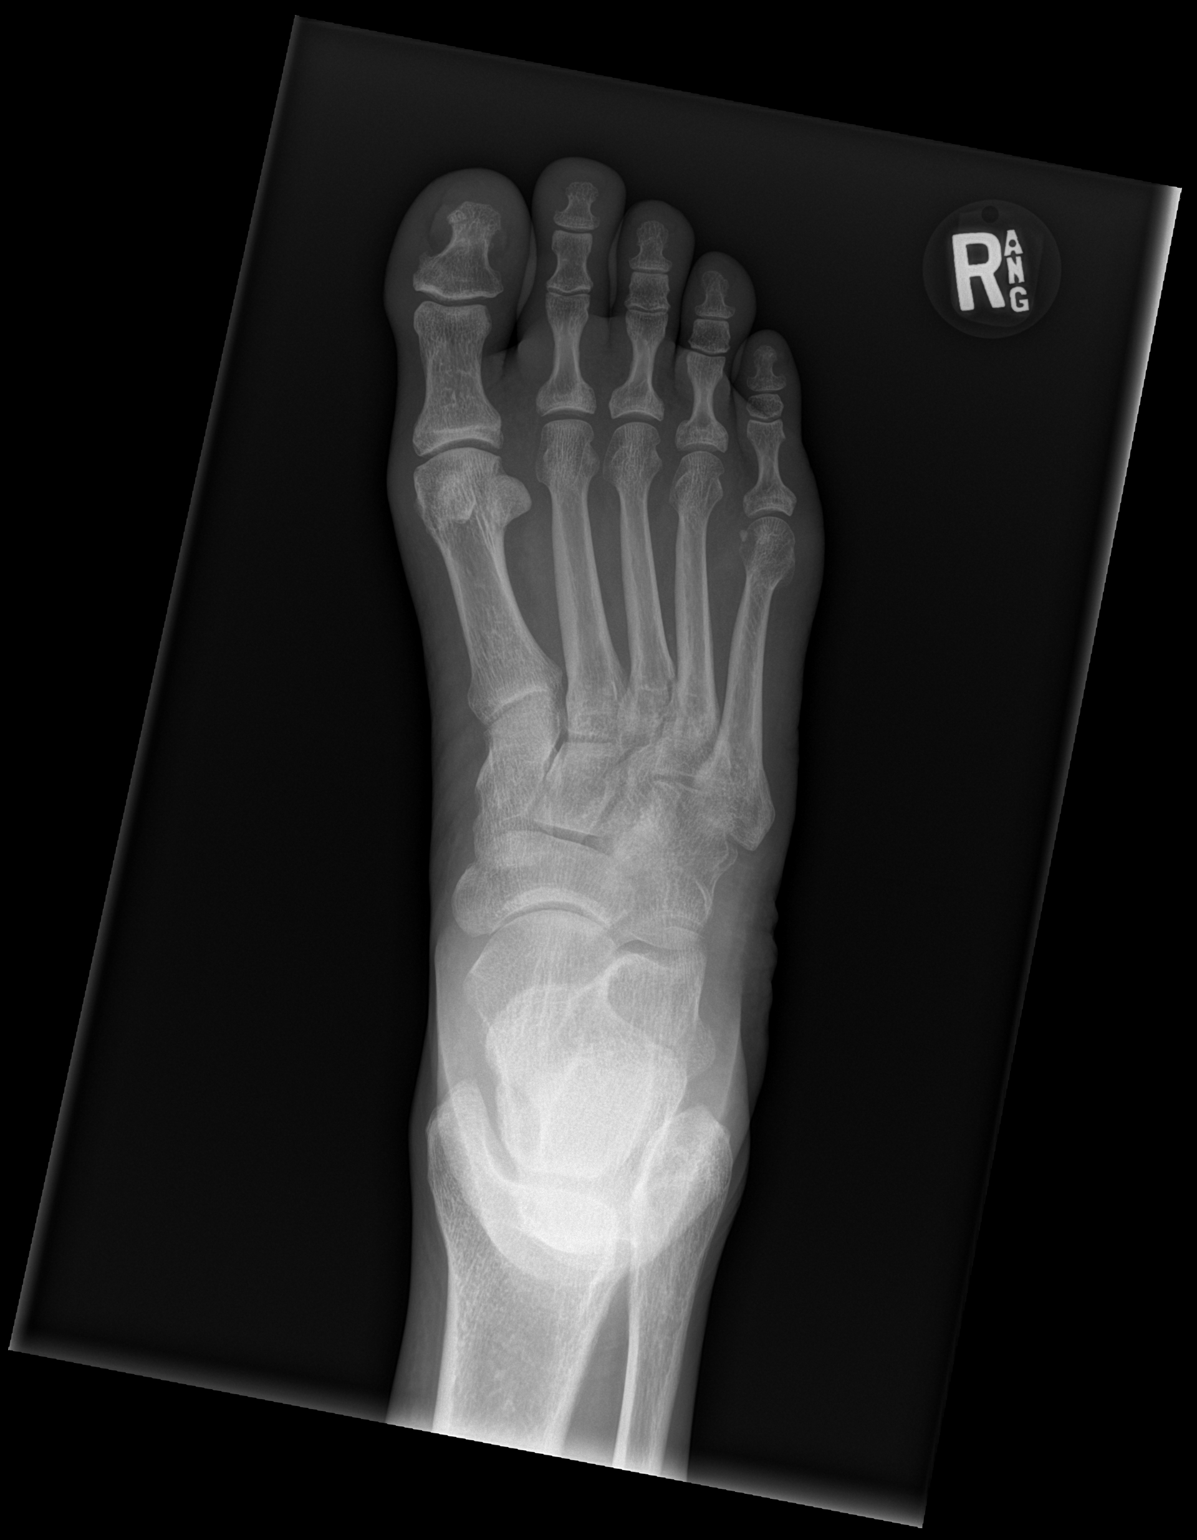

[x foot obl right]
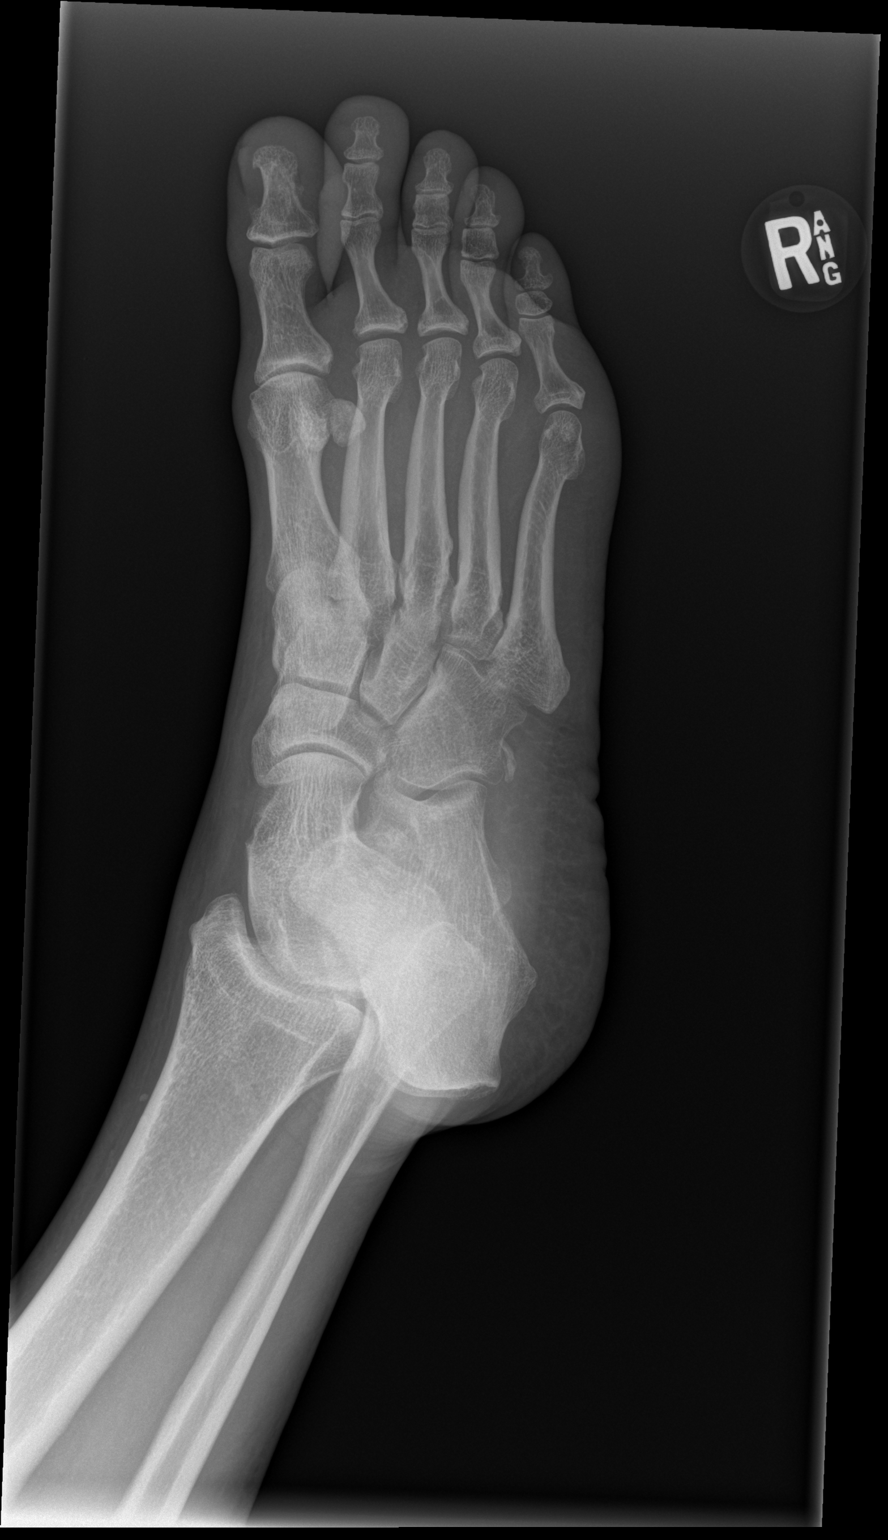

[x foot lat right]
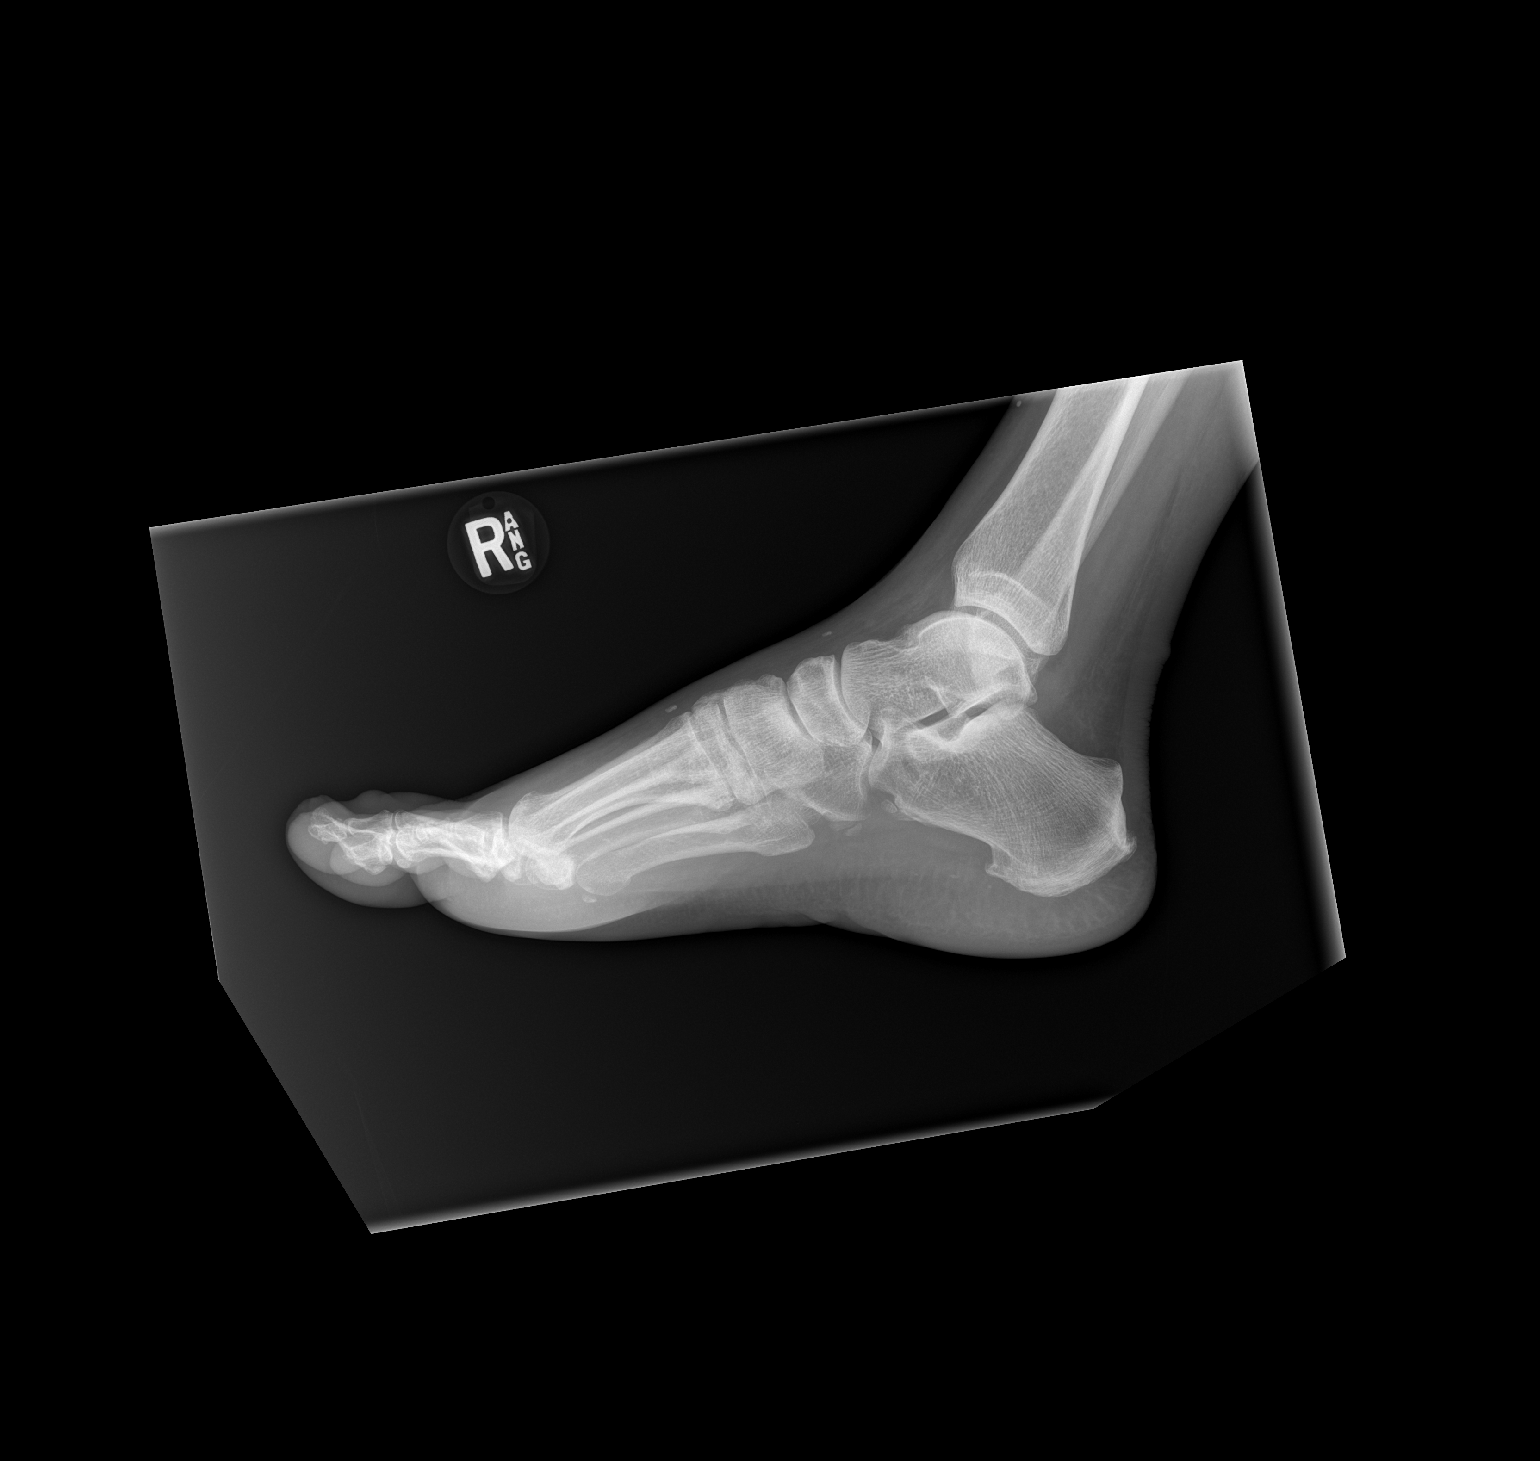

[3 of 3 positions shown; findings below may reference images not displayed]

FINDINGS: No evidence of fracture or dislocation. No focal lytic or sclerotic
lesion. Small inferior calcaneal spur. Remainder the exam is
unremarkable.
IMPRESSION: No acute findings.

## 2020-02-26 IMAGING — CR DG FOOT COMPLETE 3+V*R*
3 series · 3 of 3 positions shown · non-contrast
Comparison: None.

CLINICAL DATA: Infected third toe.

EXAM:
RIGHT FOOT COMPLETE - 3+ VIEW

[foot ap]
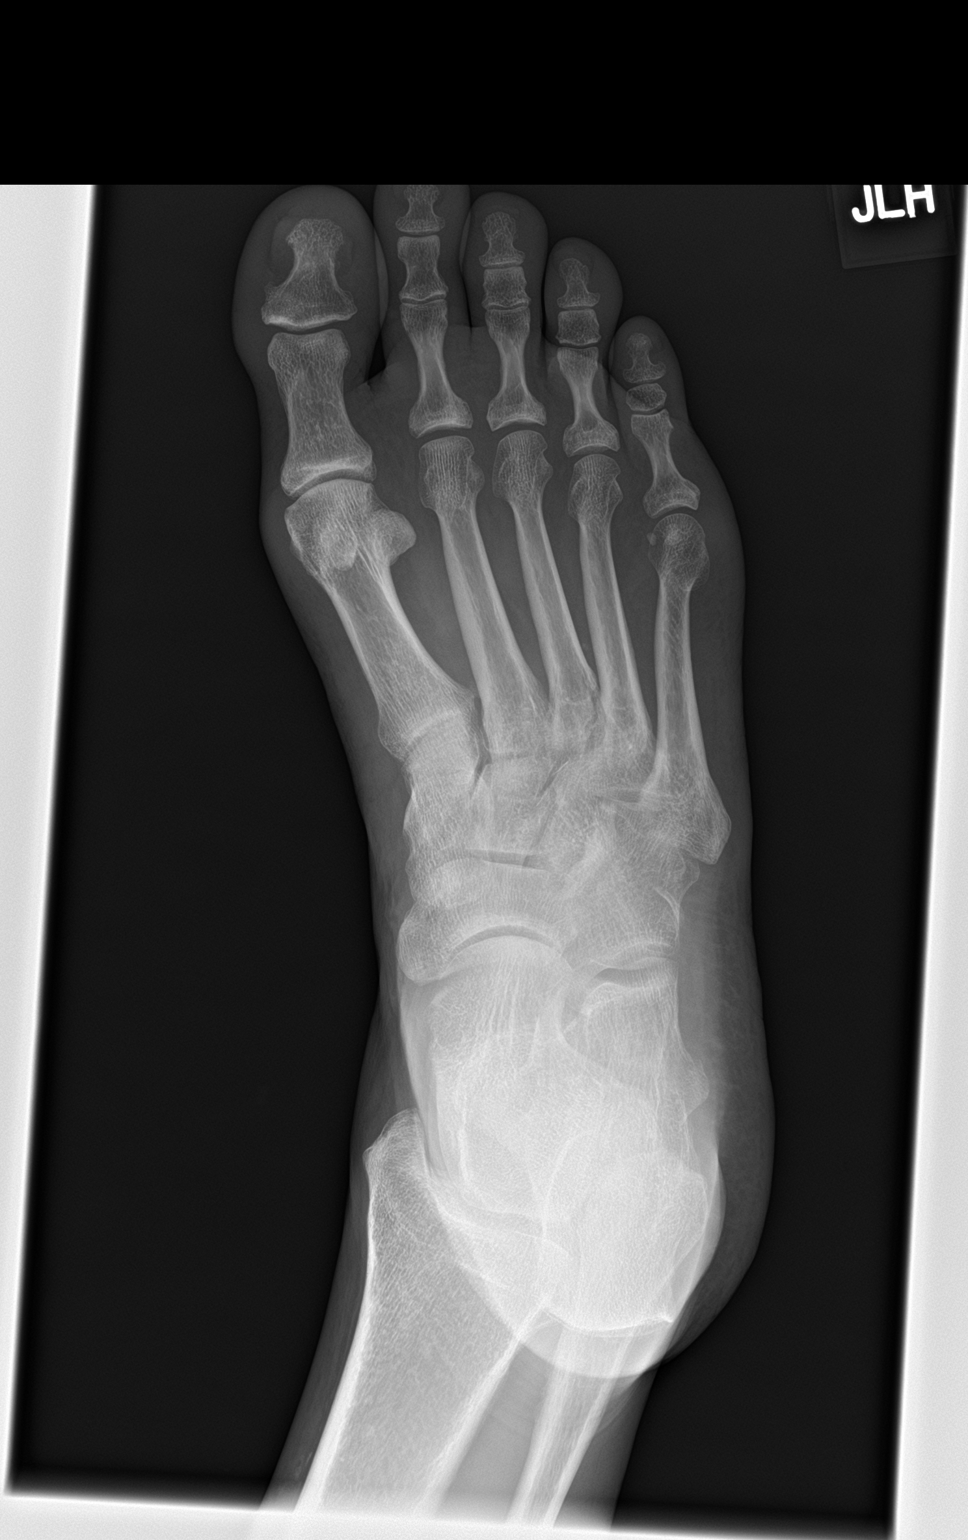

[foot obl]
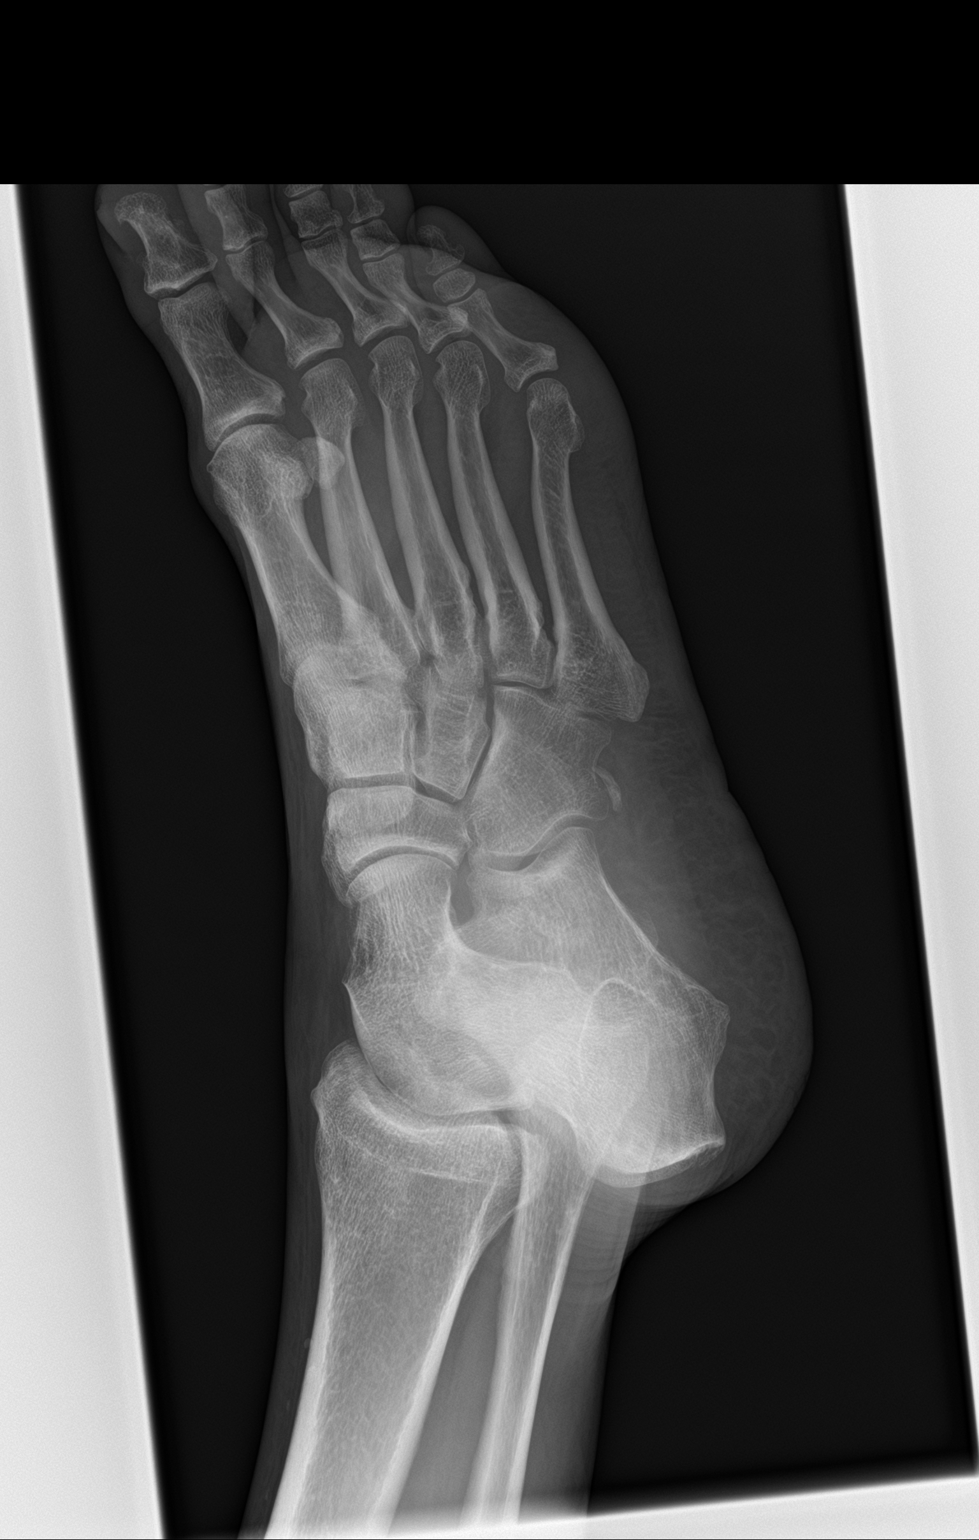

[foot lat]
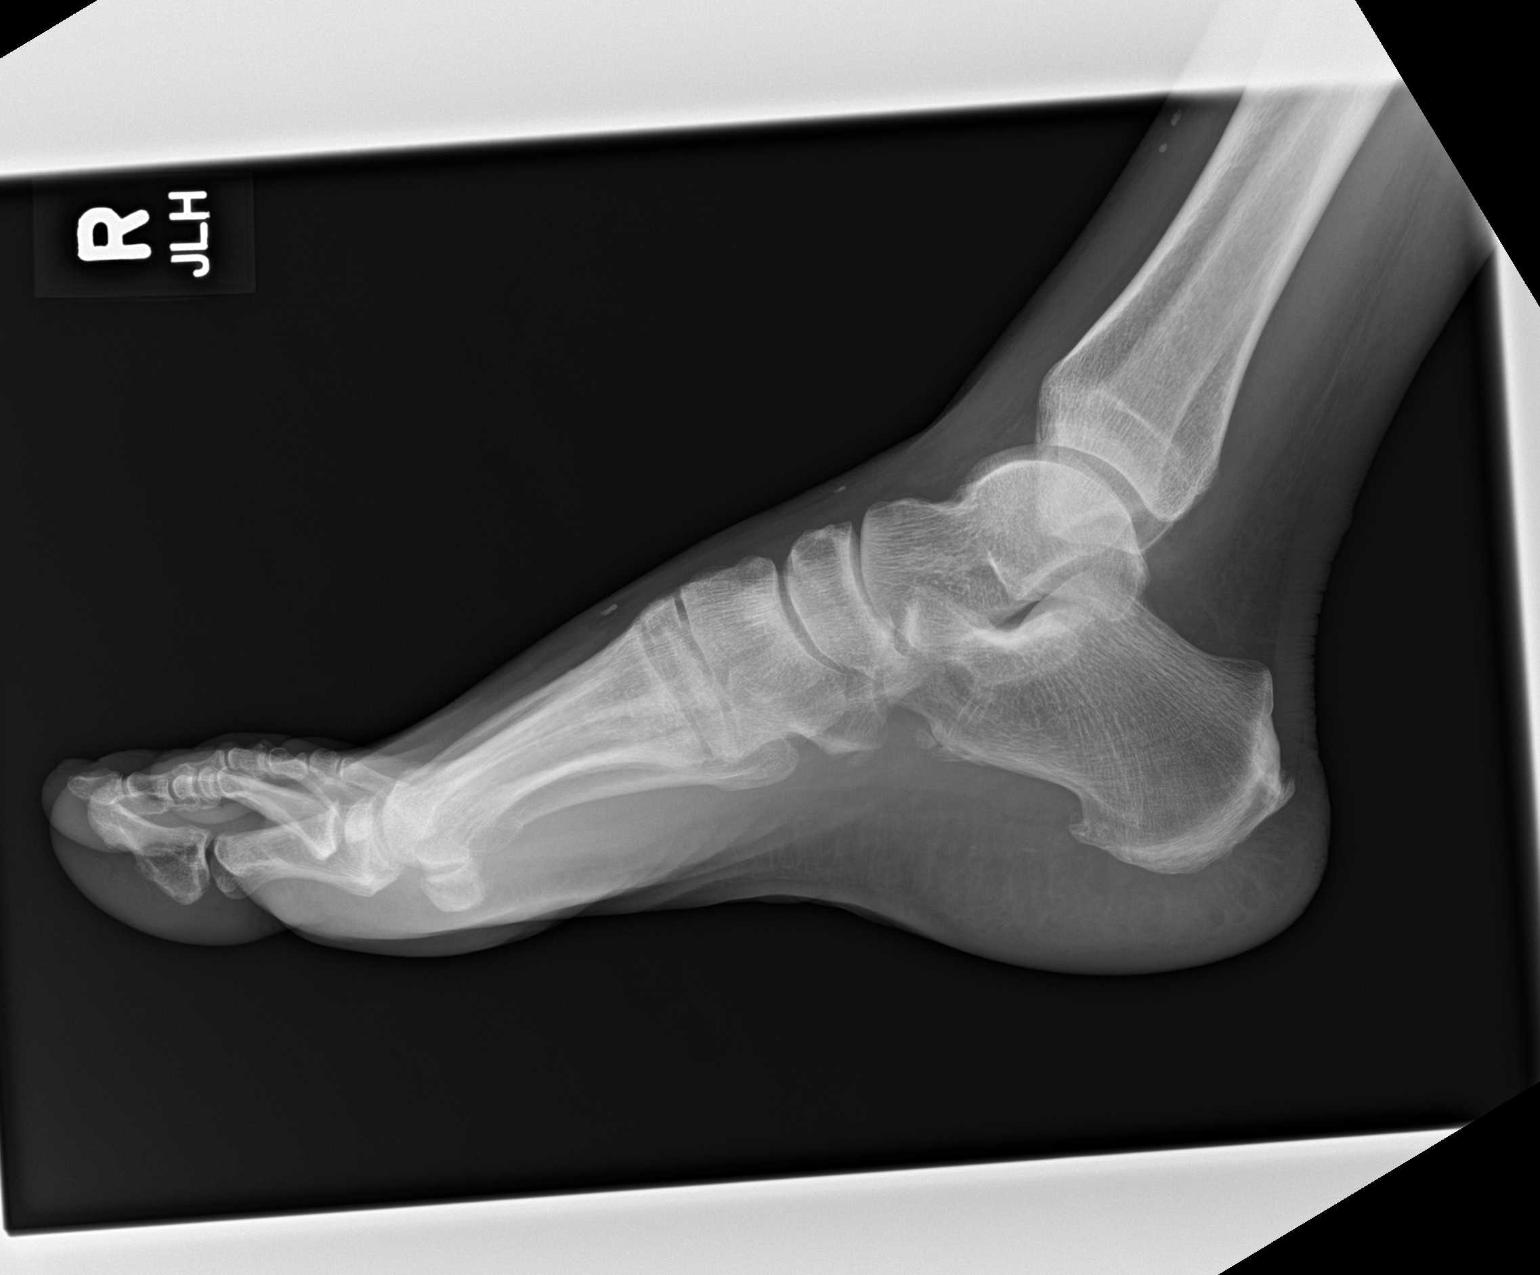

[3 of 3 positions shown; findings below may reference images not displayed]

FINDINGS: There is no evidence of fracture or dislocation. There is no
evidence of arthropathy or other focal bone abnormality. Soft
tissues are unremarkable. Minimal vascular calcification.
IMPRESSION: Negative. No evidence of osteomyelitis or other bone or joint
pathology.

## 2020-05-14 ENCOUNTER — Emergency Department (HOSPITAL_COMMUNITY)
Admission: EM | Admit: 2020-05-14 | Discharge: 2020-05-14 | Discharge status: Against Medical Advice | Payer: Medicaid Other | Attending: Emergency Medicine | Admitting: Emergency Medicine

## 2020-05-14 ENCOUNTER — Other Ambulatory Visit: Payer: Self-pay

## 2020-05-14 ENCOUNTER — Encounter (HOSPITAL_COMMUNITY): Payer: Self-pay

## 2020-05-14 DIAGNOSIS — M545 Low back pain, unspecified: Secondary | ICD-10-CM | POA: Insufficient documentation

## 2020-05-14 DIAGNOSIS — I10 Essential (primary) hypertension: Secondary | ICD-10-CM | POA: Diagnosis not present

## 2020-05-14 DIAGNOSIS — Z5321 Procedure and treatment not carried out due to patient leaving prior to being seen by health care provider: Secondary | ICD-10-CM | POA: Diagnosis not present

## 2020-05-14 NOTE — ED Notes (Addendum)
Pt eloped, pt did not inform staff she was leaving. Janetta Hora, PA aware.

## 2020-05-14 NOTE — ED Triage Notes (Addendum)
Pt BIB EMS from home. Pt c/o lower back pain. Pt currently yelling at EMS and ED staff. Pt denies chest pain and SOB. Pt has hx of HTN and states she is compliant with her BP meds. Pt has hx of back surgery, dislocated disc. Pt screaming at EMS and ED staff "my back is shattered!"  100% RA

## 2020-05-14 NOTE — ED Notes (Signed)
Pt continues to refuse staff to help reposition her more safely in the laid back recliner, pt screaming at staff to give her pain meds now. Pt screams at staff "Do not touch me!".

## 2020-05-14 NOTE — ED Notes (Signed)
Pt has slid herself halfway out of the recliner- which is flat. Pt screaming at this RN and Estill Bamberg, EMT to not touch her. Pt refusing to allow staff to help reposition pt to be more comfortable and safer positioned in chair to prevent falls. Pt screaming at staff currently to leave her alone.

## 2020-07-07 ENCOUNTER — Other Ambulatory Visit: Payer: Medicaid Other

## 2020-07-08 ENCOUNTER — Other Ambulatory Visit: Payer: Medicaid Other

## 2020-07-14 ENCOUNTER — Ambulatory Visit: Payer: Medicaid Other

## 2020-09-16 ENCOUNTER — Emergency Department (HOSPITAL_COMMUNITY)
Admission: EM | Admit: 2020-09-16 | Discharge: 2020-09-17 | Disposition: A | Discharge status: Home / Self Care | Payer: Medicaid Other | Attending: Emergency Medicine | Admitting: Emergency Medicine

## 2020-09-16 DIAGNOSIS — R509 Fever, unspecified: Secondary | ICD-10-CM | POA: Insufficient documentation

## 2020-09-16 DIAGNOSIS — R109 Unspecified abdominal pain: Secondary | ICD-10-CM | POA: Diagnosis not present

## 2020-09-16 DIAGNOSIS — Z5321 Procedure and treatment not carried out due to patient leaving prior to being seen by health care provider: Secondary | ICD-10-CM | POA: Diagnosis not present

## 2020-09-16 DIAGNOSIS — I10 Essential (primary) hypertension: Secondary | ICD-10-CM | POA: Insufficient documentation

## 2020-09-16 NOTE — ED Triage Notes (Signed)
Pt bib [facility]. Pt c/o hypertension, states she hasn't taken meds for HTN in the past year. Pt c/o aching all over and fever that started a few hours ago. Pt c/o abdominal pain radiating into back for past week. Pt denies nausea, vomiting, diarrhea, constipation. Pt has pain with urination.

## 2020-09-17 ENCOUNTER — Other Ambulatory Visit: Payer: Self-pay

## 2020-09-17 ENCOUNTER — Emergency Department (HOSPITAL_COMMUNITY): Payer: Medicaid Other

## 2020-09-17 ENCOUNTER — Encounter (HOSPITAL_COMMUNITY): Payer: Self-pay

## 2020-09-17 LAB — COMPREHENSIVE METABOLIC PANEL
ALT: 11 U/L (ref 0–44)
AST: 21 U/L (ref 15–41)
Albumin: 3.3 g/dL — ABNORMAL LOW (ref 3.5–5.0)
Alkaline Phosphatase: 82 U/L (ref 38–126)
Anion gap: 11 (ref 5–15)
BUN: 17 mg/dL (ref 6–20)
CO2: 25 mmol/L (ref 22–32)
Calcium: 9.2 mg/dL (ref 8.9–10.3)
Chloride: 101 mmol/L (ref 98–111)
Creatinine, Ser: 0.93 mg/dL (ref 0.44–1.00)
GFR, Estimated: >60 mL/min (ref >60)
Glucose, Bld: 80 mg/dL (ref 70–99)
Potassium: 4.3 mmol/L (ref 3.5–5.1)
Sodium: 137 mmol/L (ref 135–145)
Total Bilirubin: 0.2 mg/dL — ABNORMAL LOW (ref 0.3–1.2)
Total Protein: 6.1 g/dL — ABNORMAL LOW (ref 6.5–8.1)

## 2020-09-17 LAB — CBC WITH DIFFERENTIAL/PLATELET
Abs Immature Granulocytes: 0.02 10*3/uL (ref 0.00–0.07)
Basophils Absolute: 0.1 10*3/uL (ref 0.0–0.1)
Basophils Relative: 1 %
Eosinophils Absolute: 0 10*3/uL (ref 0.0–0.5)
Eosinophils Relative: 1 %
HCT: 29.1 % — ABNORMAL LOW (ref 36.0–46.0)
Hemoglobin: 9.5 g/dL — ABNORMAL LOW (ref 12.0–15.0)
Immature Granulocytes: 0 %
Lymphocytes Relative: 7 %
Lymphs Abs: 0.4 10*3/uL — ABNORMAL LOW (ref 0.7–4.0)
MCH: 22.6 pg — ABNORMAL LOW (ref 26.0–34.0)
MCHC: 32.6 g/dL (ref 30.0–36.0)
MCV: 69.3 fL — ABNORMAL LOW (ref 80.0–100.0)
Monocytes Absolute: 0.3 10*3/uL (ref 0.1–1.0)
Monocytes Relative: 6 %
Neutro Abs: 5.4 10*3/uL (ref 1.7–7.7)
Neutrophils Relative %: 85 %
Platelets: 324 10*3/uL (ref 150–400)
RBC: 4.2 MIL/uL (ref 3.87–5.11)
RDW: 21 % — ABNORMAL HIGH (ref 11.5–15.5)
WBC: 6.2 10*3/uL (ref 4.0–10.5)
nRBC: 0 % (ref 0.0–0.2)

## 2020-09-17 LAB — LIPASE, BLOOD: Lipase: 34 U/L (ref 11–51)

## 2020-09-17 LAB — TROPONIN I (HIGH SENSITIVITY): Troponin I (High Sensitivity): 13 ng/L (ref <18)

## 2020-09-17 MED ORDER — ACETAMINOPHEN 325 MG PO TABS
650.0000 mg | ORAL_TABLET | Freq: Once | ORAL | Status: AC
  Administered 2020-09-17: 650 mg via ORAL
  Filled 2020-09-17: qty 2

## 2020-09-17 NOTE — ED Triage Notes (Addendum)
Emergency Medicine Provider Triage Evaluation Note  Terri [name] , a 45 y.o. female  was evaluated in triage.  Pt complains of fever (103.2 here), diffuse body aches, abdominal pain, pain with urination, upper abdominal pain that radiates to back.  Has not taken BP meds in 1 year. Daily fentanyl IV use.  Vaccinated for COVID no booster. Lives in hotel. Chest achy as well feels like her whole body is in pain. Chronic cough. Sore throat.   Review of Systems  Positive: Fever, body aches, abdominal/back pain, pain with urination Negative: Cough, diarrhea, vomiting   Physical Exam  BP (!) 158/109   Pulse 99   Temp (!) 103.2 F (39.6 C) (Oral)   Resp (!) 22   SpO2 99%  Gen:   Awake, moaning  HEENT:  Atraumatic Resp:  Normal effort Cardiac:  Normal rate  Abd:   Nondistended. Diffuse upper abdominal and bilateral CVA tenderness MSK:   Moves extremities without difficulty Neuro:  Speech clear   Medical Decision Making  Medically screening exam initiated at 12:08 AM.  Appropriate orders placed.  [name] was informed that the remainder of the evaluation will be completed by another provider, this initial triage assessment does not replace that evaluation, and the importance of remaining in the ED until their evaluation is complete.  Clinical Impression   Fever, myalgias, abdominal/flank tenderness, pain with urination.    Patient was informed that the remainder of the evaluation will be completed by another provider.  Patient made aware this encounter is a triage and screening encounter and no beds are available at this time in the ER.  Patient made aware triage orders have been placed and patient will be placed in the waiting room while work up is initiated. Patient encouraged to await a formal ER encounter with a clinician once an ER bed becomes available.  Patient made aware that exiting the department prior to formal encounter with an ER clinician and completion of the work-up is  considered leaving against medical advice.  At that time there is no guarantee that there are no emergency medical conditions present and patient assumes risks of leaving without completion of ER evaluation including worsening condition, permanent disability and death. Patient verbalizes understanding. Patient has signed triage/MSE paperwork.     Kinnie Feil, PA-C 09/17/20 0010    Kinnie Feil, PA-C 09/17/20 [phone]

## 2020-09-17 NOTE — ED Notes (Signed)
Pt refused covid test

## 2020-09-17 NOTE — ED Notes (Signed)
Pt left due to not being seen quick enough 

## 2021-01-13 ENCOUNTER — Observation Stay (HOSPITAL_COMMUNITY)
Admission: EM | Admit: 2021-01-13 | Discharge: 2021-01-14 | DRG: 378 | Discharge status: Against Medical Advice | Payer: Medicaid Other | Attending: Internal Medicine | Admitting: Internal Medicine

## 2021-01-13 ENCOUNTER — Encounter (HOSPITAL_COMMUNITY): Payer: Self-pay

## 2021-01-13 ENCOUNTER — Emergency Department (HOSPITAL_COMMUNITY): Payer: Medicaid Other

## 2021-01-13 ENCOUNTER — Other Ambulatory Visit: Payer: Self-pay

## 2021-01-13 DIAGNOSIS — K289 Gastrojejunal ulcer, unspecified as acute or chronic, without hemorrhage or perforation: Secondary | ICD-10-CM | POA: Diagnosis present

## 2021-01-13 DIAGNOSIS — Z9884 Bariatric surgery status: Secondary | ICD-10-CM

## 2021-01-13 DIAGNOSIS — Z9119 Patient's noncompliance with other medical treatment and regimen: Secondary | ICD-10-CM | POA: Diagnosis not present

## 2021-01-13 DIAGNOSIS — F1910 Other psychoactive substance abuse, uncomplicated: Secondary | ICD-10-CM

## 2021-01-13 DIAGNOSIS — Z9049 Acquired absence of other specified parts of digestive tract: Secondary | ICD-10-CM

## 2021-01-13 DIAGNOSIS — K279 Peptic ulcer, site unspecified, unspecified as acute or chronic, without hemorrhage or perforation: Secondary | ICD-10-CM | POA: Diagnosis present

## 2021-01-13 DIAGNOSIS — K219 Gastro-esophageal reflux disease without esophagitis: Secondary | ICD-10-CM | POA: Diagnosis present

## 2021-01-13 DIAGNOSIS — K254 Chronic or unspecified gastric ulcer with hemorrhage: Secondary | ICD-10-CM | POA: Diagnosis present

## 2021-01-13 DIAGNOSIS — I160 Hypertensive urgency: Secondary | ICD-10-CM | POA: Diagnosis present

## 2021-01-13 DIAGNOSIS — Z8249 Family history of ischemic heart disease and other diseases of the circulatory system: Secondary | ICD-10-CM

## 2021-01-13 DIAGNOSIS — Z86718 Personal history of other venous thrombosis and embolism: Secondary | ICD-10-CM | POA: Diagnosis not present

## 2021-01-13 DIAGNOSIS — T85898A Other specified complication of other internal prosthetic devices, implants and grafts, initial encounter: Secondary | ICD-10-CM | POA: Diagnosis present

## 2021-01-13 DIAGNOSIS — G8929 Other chronic pain: Secondary | ICD-10-CM | POA: Diagnosis not present

## 2021-01-13 DIAGNOSIS — K259 Gastric ulcer, unspecified as acute or chronic, without hemorrhage or perforation: Secondary | ICD-10-CM

## 2021-01-13 DIAGNOSIS — F17210 Nicotine dependence, cigarettes, uncomplicated: Secondary | ICD-10-CM | POA: Diagnosis present

## 2021-01-13 DIAGNOSIS — R1013 Epigastric pain: Secondary | ICD-10-CM | POA: Diagnosis present

## 2021-01-13 DIAGNOSIS — F419 Anxiety disorder, unspecified: Secondary | ICD-10-CM | POA: Diagnosis present

## 2021-01-13 DIAGNOSIS — I10 Essential (primary) hypertension: Secondary | ICD-10-CM | POA: Diagnosis not present

## 2021-01-13 DIAGNOSIS — F1510 Other stimulant abuse, uncomplicated: Secondary | ICD-10-CM | POA: Diagnosis present

## 2021-01-13 DIAGNOSIS — R6510 Systemic inflammatory response syndrome (SIRS) of non-infectious origin without acute organ dysfunction: Secondary | ICD-10-CM | POA: Diagnosis present

## 2021-01-13 DIAGNOSIS — F1410 Cocaine abuse, uncomplicated: Secondary | ICD-10-CM | POA: Diagnosis not present

## 2021-01-13 DIAGNOSIS — Z888 Allergy status to other drugs, medicaments and biological substances status: Secondary | ICD-10-CM | POA: Diagnosis not present

## 2021-01-13 DIAGNOSIS — Z9851 Tubal ligation status: Secondary | ICD-10-CM | POA: Diagnosis not present

## 2021-01-13 DIAGNOSIS — Z20822 Contact with and (suspected) exposure to covid-19: Secondary | ICD-10-CM | POA: Diagnosis not present

## 2021-01-13 DIAGNOSIS — N390 Urinary tract infection, site not specified: Secondary | ICD-10-CM | POA: Diagnosis present

## 2021-01-13 DIAGNOSIS — F1110 Opioid abuse, uncomplicated: Secondary | ICD-10-CM | POA: Diagnosis present

## 2021-01-13 DIAGNOSIS — N3000 Acute cystitis without hematuria: Secondary | ICD-10-CM | POA: Diagnosis not present

## 2021-01-13 DIAGNOSIS — F319 Bipolar disorder, unspecified: Secondary | ICD-10-CM | POA: Diagnosis not present

## 2021-01-13 DIAGNOSIS — Z720 Tobacco use: Secondary | ICD-10-CM | POA: Diagnosis present

## 2021-01-13 LAB — COMPREHENSIVE METABOLIC PANEL
ALT: 15 U/L (ref 0–44)
AST: 22 U/L (ref 15–41)
Albumin: 2.8 g/dL — ABNORMAL LOW (ref 3.5–5.0)
Alkaline Phosphatase: 118 U/L (ref 38–126)
Anion gap: 7 (ref 5–15)
BUN: 17 mg/dL (ref 6–20)
CO2: 29 mmol/L (ref 22–32)
Calcium: 8.8 mg/dL — ABNORMAL LOW (ref 8.9–10.3)
Chloride: 97 mmol/L — ABNORMAL LOW (ref 98–111)
Creatinine, Ser: 0.75 mg/dL (ref 0.44–1.00)
GFR, Estimated: >60 mL/min (ref >60)
Glucose, Bld: 95 mg/dL (ref 70–99)
Potassium: 4 mmol/L (ref 3.5–5.1)
Sodium: 133 mmol/L — ABNORMAL LOW (ref 135–145)
Total Bilirubin: 0.4 mg/dL (ref 0.3–1.2)
Total Protein: 6.5 g/dL (ref 6.5–8.1)

## 2021-01-13 LAB — RAPID URINE DRUG SCREEN, HOSP PERFORMED
Amphetamines: POSITIVE — AB
Barbiturates: NOT DETECTED
Benzodiazepines: NOT DETECTED
Cocaine: POSITIVE — AB
Opiates: NOT DETECTED
Tetrahydrocannabinol: NOT DETECTED

## 2021-01-13 LAB — CBC WITH DIFFERENTIAL/PLATELET
Abs Immature Granulocytes: 0.03 10*3/uL (ref 0.00–0.07)
Basophils Absolute: 0.1 10*3/uL (ref 0.0–0.1)
Basophils Relative: 1 %
Eosinophils Absolute: 0 10*3/uL (ref 0.0–0.5)
Eosinophils Relative: 0 %
HCT: 32.5 % — ABNORMAL LOW (ref 36.0–46.0)
Hemoglobin: 10.6 g/dL — ABNORMAL LOW (ref 12.0–15.0)
Immature Granulocytes: 0 %
Lymphocytes Relative: 30 %
Lymphs Abs: 2.3 10*3/uL (ref 0.7–4.0)
MCH: 21.2 pg — ABNORMAL LOW (ref 26.0–34.0)
MCHC: 32.6 g/dL (ref 30.0–36.0)
MCV: 65.1 fL — ABNORMAL LOW (ref 80.0–100.0)
Monocytes Absolute: 0.5 10*3/uL (ref 0.1–1.0)
Monocytes Relative: 7 %
Neutro Abs: 4.7 10*3/uL (ref 1.7–7.7)
Neutrophils Relative %: 62 %
Platelets: 343 10*3/uL (ref 150–400)
RBC: 4.99 MIL/uL (ref 3.87–5.11)
RDW: 20.5 % — ABNORMAL HIGH (ref 11.5–15.5)
WBC: 7.6 10*3/uL (ref 4.0–10.5)
nRBC: 0 % (ref 0.0–0.2)

## 2021-01-13 LAB — URINALYSIS, ROUTINE W REFLEX MICROSCOPIC
Bilirubin Urine: NEGATIVE
Glucose, UA: NEGATIVE mg/dL
Hgb urine dipstick: NEGATIVE
Ketones, ur: NEGATIVE mg/dL
Nitrite: POSITIVE — AB
Protein, ur: NEGATIVE mg/dL
Specific Gravity, Urine: 1.015 (ref 1.005–1.030)
pH: 6 (ref 5.0–8.0)

## 2021-01-13 LAB — MRSA NEXT GEN BY PCR, NASAL: MRSA by PCR Next Gen: NOT DETECTED

## 2021-01-13 LAB — LACTIC ACID, PLASMA
Lactic Acid, Venous: 1.5 mmol/L (ref 0.5–1.9)
Lactic Acid, Venous: 1.3 mmol/L (ref 0.5–1.9)

## 2021-01-13 LAB — HIV ANTIBODY (ROUTINE TESTING W REFLEX): HIV Screen 4th Generation wRfx: NONREACTIVE

## 2021-01-13 LAB — I-STAT BETA HCG BLOOD, ED (MC, WL, AP ONLY): I-stat hCG, quantitative: <5 m[IU]/mL (ref <5)

## 2021-01-13 LAB — TROPONIN I (HIGH SENSITIVITY): Troponin I (High Sensitivity): 7 ng/L (ref <18)

## 2021-01-13 LAB — LIPASE, BLOOD: Lipase: 23 U/L (ref 11–51)

## 2021-01-13 LAB — POC OCCULT BLOOD, ED
Fecal Occult Bld: NEGATIVE
Fecal Occult Bld: NEGATIVE

## 2021-01-13 MED ORDER — ENALAPRILAT 1.25 MG/ML IV SOLN
2.5000 mg | Freq: Four times a day (QID) | INTRAVENOUS | Status: DC
  Administered 2021-01-13 – 2021-01-14 (×2): 2.5 mg via INTRAVENOUS
  Filled 2021-01-13 (×4): qty 2

## 2021-01-13 MED ORDER — HYDROMORPHONE HCL 1 MG/ML IJ SOLN
0.5000 mg | INTRAMUSCULAR | Status: DC | PRN
  Administered 2021-01-13 – 2021-01-14 (×6): 1 mg via INTRAVENOUS
  Filled 2021-01-13 (×6): qty 1

## 2021-01-13 MED ORDER — ENALAPRILAT 1.25 MG/ML IV SOLN
1.2500 mg | INTRAVENOUS | Status: DC
  Filled 2021-01-13 (×2): qty 1

## 2021-01-13 MED ORDER — SODIUM CHLORIDE 0.9 % IV BOLUS
1000.0000 mL | Freq: Once | INTRAVENOUS | Status: AC
  Administered 2021-01-13: 1000 mL via INTRAVENOUS

## 2021-01-13 MED ORDER — CLONIDINE HCL 0.1 MG PO TABS
0.1000 mg | ORAL_TABLET | Freq: Two times a day (BID) | ORAL | Status: DC
  Administered 2021-01-13: 0.1 mg via ORAL
  Filled 2021-01-13: qty 1

## 2021-01-13 MED ORDER — LORAZEPAM 2 MG/ML IJ SOLN: 1.0000 mg | INTRAMUSCULAR | Status: DC | PRN

## 2021-01-13 MED ORDER — ACETAMINOPHEN 325 MG PO TABS
650.0000 mg | ORAL_TABLET | Freq: Four times a day (QID) | ORAL | Status: DC | PRN
  Administered 2021-01-13: 650 mg via ORAL
  Filled 2021-01-13 (×2): qty 2

## 2021-01-13 MED ORDER — ACETAMINOPHEN 500 MG PO TABS
1000.0000 mg | ORAL_TABLET | Freq: Once | ORAL | Status: AC
  Administered 2021-01-13: 1000 mg via ORAL
  Filled 2021-01-13: qty 2

## 2021-01-13 MED ORDER — NITROGLYCERIN 2 % TD OINT: 0.5000 [in_us] | TOPICAL_OINTMENT | Freq: Four times a day (QID) | TRANSDERMAL | Status: DC

## 2021-01-13 MED ORDER — MORPHINE SULFATE (PF) 4 MG/ML IV SOLN
4.0000 mg | Freq: Once | INTRAVENOUS | Status: AC
  Administered 2021-01-13: 4 mg via INTRAVENOUS
  Filled 2021-01-13: qty 1

## 2021-01-13 MED ORDER — DICYCLOMINE HCL 10 MG PO CAPS
20.0000 mg | ORAL_CAPSULE | Freq: Once | ORAL | Status: AC
  Administered 2021-01-13: 20 mg via ORAL
  Filled 2021-01-13: qty 2

## 2021-01-13 MED ORDER — SUCRALFATE 1 GM/10ML PO SUSP
1.0000 g | Freq: Three times a day (TID) | ORAL | Status: DC
  Administered 2021-01-13 – 2021-01-14 (×2): 1 g via ORAL
  Filled 2021-01-13 (×2): qty 10

## 2021-01-13 MED ORDER — NICOTINE 21 MG/24HR TD PT24
21.0000 mg | MEDICATED_PATCH | TRANSDERMAL | Status: DC
  Administered 2021-01-13: 21 mg via TRANSDERMAL
  Filled 2021-01-13: qty 1

## 2021-01-13 MED ORDER — ONDANSETRON HCL 4 MG PO TABS: 4.0000 mg | ORAL_TABLET | Freq: Four times a day (QID) | ORAL | Status: DC | PRN

## 2021-01-13 MED ORDER — CLONIDINE HCL 0.1 MG PO TABS
0.1000 mg | ORAL_TABLET | Freq: Once | ORAL | Status: AC
  Administered 2021-01-13: 0.1 mg via ORAL
  Filled 2021-01-13: qty 1

## 2021-01-13 MED ORDER — LORAZEPAM 1 MG PO TABS
2.0000 mg | ORAL_TABLET | Freq: Four times a day (QID) | ORAL | Status: DC | PRN
  Administered 2021-01-13 – 2021-01-14 (×2): 2 mg via ORAL
  Filled 2021-01-13 (×2): qty 2

## 2021-01-13 MED ORDER — LORAZEPAM 1 MG PO TABS
1.0000 mg | ORAL_TABLET | ORAL | Status: DC | PRN
  Administered 2021-01-13: 1 mg via ORAL
  Filled 2021-01-13: qty 1

## 2021-01-13 MED ORDER — MORPHINE SULFATE (PF) 2 MG/ML IV SOLN
2.0000 mg | Freq: Once | INTRAVENOUS | Status: AC
  Administered 2021-01-13: 2 mg via INTRAVENOUS
  Filled 2021-01-13: qty 1

## 2021-01-13 MED ORDER — PANTOPRAZOLE SODIUM 40 MG IV SOLR
40.0000 mg | Freq: Once | INTRAVENOUS | Status: AC
  Administered 2021-01-13: 40 mg via INTRAVENOUS
  Filled 2021-01-13: qty 40

## 2021-01-13 MED ORDER — HYDRALAZINE HCL 20 MG/ML IJ SOLN
10.0000 mg | Freq: Once | INTRAMUSCULAR | Status: AC
  Administered 2021-01-13: 10 mg via INTRAVENOUS
  Filled 2021-01-13: qty 1

## 2021-01-13 MED ORDER — ONDANSETRON HCL 4 MG/2ML IJ SOLN: 4.0000 mg | Freq: Four times a day (QID) | INTRAMUSCULAR | Status: DC | PRN

## 2021-01-13 MED ORDER — AMLODIPINE BESYLATE 10 MG PO TABS: 10.0000 mg | ORAL_TABLET | Freq: Every day | ORAL | Status: DC

## 2021-01-13 MED ORDER — CEFTRIAXONE SODIUM 1 G IJ SOLR
1.0000 g | INTRAMUSCULAR | Status: DC
  Filled 2021-01-13: qty 10

## 2021-01-13 MED ORDER — HYDROCODONE-ACETAMINOPHEN 5-325 MG PO TABS
1.0000 | ORAL_TABLET | ORAL | Status: DC | PRN
  Administered 2021-01-13: 2 via ORAL
  Administered 2021-01-13: 1 via ORAL
  Administered 2021-01-14: 2 via ORAL
  Filled 2021-01-13: qty 2
  Filled 2021-01-13: qty 1
  Filled 2021-01-13: qty 2

## 2021-01-13 MED ORDER — ENALAPRILAT 1.25 MG/ML IV SOLN
1.2500 mg | Freq: Four times a day (QID) | INTRAVENOUS | Status: DC
  Administered 2021-01-13: 1.25 mg via INTRAVENOUS
  Filled 2021-01-13: qty 1

## 2021-01-13 MED ORDER — DIPHENHYDRAMINE HCL 25 MG PO CAPS
25.0000 mg | ORAL_CAPSULE | Freq: Once | ORAL | Status: AC
  Administered 2021-01-13: 25 mg via ORAL
  Filled 2021-01-13: qty 1

## 2021-01-13 MED ORDER — CHLORHEXIDINE GLUCONATE CLOTH 2 % EX PADS
6.0000 | MEDICATED_PAD | Freq: Every day | CUTANEOUS | Status: DC
  Administered 2021-01-13 – 2021-01-14 (×2): 6 via TOPICAL

## 2021-01-13 MED ORDER — SODIUM CHLORIDE 0.9 % IV SOLN
1.0000 g | INTRAVENOUS | Status: AC
  Administered 2021-01-13: 1 g via INTRAVENOUS
  Filled 2021-01-13: qty 10

## 2021-01-13 MED ORDER — PANTOPRAZOLE SODIUM 40 MG IV SOLR
40.0000 mg | Freq: Two times a day (BID) | INTRAVENOUS | Status: DC
  Administered 2021-01-14: 40 mg via INTRAVENOUS
  Filled 2021-01-13: qty 40

## 2021-01-13 MED ORDER — METOCLOPRAMIDE HCL 5 MG/ML IJ SOLN
10.0000 mg | Freq: Once | INTRAMUSCULAR | Status: AC
  Administered 2021-01-13: 10 mg via INTRAVENOUS
  Filled 2021-01-13: qty 2

## 2021-01-13 MED ORDER — LORAZEPAM 1 MG PO TABS
1.0000 mg | ORAL_TABLET | Freq: Once | ORAL | Status: AC
  Administered 2021-01-13: 1 mg via ORAL
  Filled 2021-01-13: qty 1

## 2021-01-13 MED ORDER — ALUM & MAG HYDROXIDE-SIMETH 200-200-20 MG/5ML PO SUSP: 30.0000 mL | ORAL | Status: DC | PRN

## 2021-01-13 MED ORDER — NITROGLYCERIN 2 % TD OINT
1.0000 [in_us] | TOPICAL_OINTMENT | Freq: Four times a day (QID) | TRANSDERMAL | Status: DC
  Filled 2021-01-13 (×2): qty 30

## 2021-01-13 MED ORDER — AMLODIPINE BESYLATE 5 MG PO TABS
10.0000 mg | ORAL_TABLET | Freq: Once | ORAL | Status: AC
  Administered 2021-01-13: 10 mg via ORAL
  Filled 2021-01-13: qty 2

## 2021-01-13 MED ORDER — IOHEXOL 350 MG/ML SOLN
80.0000 mL | Freq: Once | INTRAVENOUS | Status: AC | PRN
  Administered 2021-01-13: 80 mL via INTRAVENOUS

## 2021-01-13 MED ORDER — SODIUM CHLORIDE 0.9 % IV SOLN: INTRAVENOUS | Status: DC

## 2021-01-13 MED ORDER — ACETAMINOPHEN 650 MG RE SUPP: 650.0000 mg | Freq: Four times a day (QID) | RECTAL | Status: DC | PRN

## 2021-01-13 NOTE — ED Notes (Signed)
Patient is drink pepsi at bedside despite being told she is not supposed to be eating and drinking.

## 2021-01-13 NOTE — ED Provider Notes (Signed)
[facility] DEPT Provider Note   CSN: [id number] Arrival date & time: 01/13/21  [id number]     History Chief Complaint  Patient presents with   Abdominal Pain    Terri [name] is a 45 y.o. female.  HPI Patient is a 45 year old female states that she has a history of polysubstance abuse last used heroin intranasally 3 days ago.  She also has a history of gastric bypass has a history of grade a esophagitis with gastric ulcer, bipolar, chronic pain, hypertension not taking blood pressure medicine, headaches  Patient is presented to the emergency room today for approximately 1 week of abdominal pain that she describes as burning severe 10/10 and she states that it radiates into her chest and she has brackish taste in her mouth occasionally.  She states that she does not recall who did her gastric sleeve surgery.  She states that she is prescribed blood pressure medication but does not take this because she does not have insurance currently.  She denies any shortness of breath.  Does endorse nausea fatigue.  Denies any lightheadedness or dizziness.  No fevers or chills.  States that her bowel movements have been normal denies any blood or dark tarry appearing stool.   She had a cholecystectomy prior to her last CT scan which was done in 2020.  She states the abdominal pain is severe and constant.   Her only attempted treatment has been heroin intranasally which is failed to treat her pain.      Past Medical History:  Diagnosis Date   Arthritis    Back and legs   Bipolar disorder (HCC)    Chronic back pain    Depression    DVT (deep venous thrombosis) (HCC)    early 20's leg   History of blood transfusion    Hypertension    Migraine headache    Neuropathy    Pneumonia    Positive PPD    x 2 last time 09/2017   Sciatica    Stress incontinence     Patient Active Problem List   Diagnosis Date Noted   Acute pyelonephritis 02/11/2019   Sepsis due to  Escherichia coli (E. 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   Persistent fever 02/09/2019   COVID-19 virus not detected    Chest pain    Bacteremia due to Escherichia coli    Esophagitis    Hypokalemia    Hypomagnesemia    Sepsis ([place]) 02/02/2019   Leucocytosis 02/02/2019   Thrombocytosis 02/02/2019   UTI (urinary tract infection) 02/02/2019   GIB (gastrointestinal bleeding) 09/13/2017   Apnea 09/13/2017   Essential hypertension    Cellulitis and abscess of neck 08/12/2017   Cellulitis of submandibular region 08/10/2017   Odontogenic infection of jaw 08/10/2017   Cholecystitis 02/14/2017   Opioid abuse with opioid-induced mood disorder ([place]) 01/25/2017   Anxiety 02/11/2014   Depression 07/12/2012    Past Surgical History:  Procedure Laterality Date   ALVEOLOPLASTY Bilateral 01/31/2018   Procedure: ALVEOLOPLASTY;  Surgeon: Diona Browner, DDS;  Location: [facility];  Service: Oral Surgery;  Laterality: Bilateral;   ANKLE SURGERY Left 1992   with hardware   BACK SURGERY     BIOPSY  02/08/2019   Procedure: BIOPSY;  Surgeon: Rush Landmark Telford Nab., MD;  Location: [facility];  Service: Gastroenterology;;   CESAREAN SECTION     CHOLECYSTECTOMY N/A 02/15/2017  Procedure: LAPAROSCOPIC CHOLECYSTECTOMY;  Surgeon: Clovis Riley, MD;  Location: [facility];  Service: General;  Laterality: N/A;   ENTEROSCOPY N/A 02/08/2019   Procedure: ENTEROSCOPY;  Surgeon: Rush Landmark Telford Nab., MD;  Location: [facility];  Service: Gastroenterology;  Laterality: N/A;   ESOPHAGOGASTRODUODENOSCOPY (EGD) WITH PROPOFOL N/A 09/15/2017   Procedure: ESOPHAGOGASTRODUODENOSCOPY (EGD) WITH PROPOFOL;  Surgeon: Clarene Essex, MD;  [facility];  Service: Endoscopy;  Laterality: N/A;   ESOPHAGOGASTRODUODENOSCOPY (EGD) WITH PROPOFOL N/A 02/08/2019   Procedure: ESOPHAGOGASTRODUODENOSCOPY (EGD) WITH PROPOFOL;  Surgeon: Rush Landmark  Telford Nab., MD;  [facility];  Service: Gastroenterology;  Laterality: N/A;   GASTRIC BYPASS     INCISION AND DRAINAGE OF PERITONSILLAR ABCESS Left 08/13/2017   Procedure: INCISION AND DRAINAGE OF LEFT NECK ABSCESS;  Surgeon: Melida Quitter, MD;  Location: [facility];  Service: ENT;  Laterality: Left;   LAPAROSCOPIC LYSIS OF ADHESIONS  02/15/2017   Procedure: LAPAROSCOPIC LYSIS OF ADHESIONS;  Surgeon: Clovis Riley, MD;  Location: [facility];  Service: General;;   LUMBAR DISC SURGERY     LUMBAR FUSION     SAVORY DILATION N/A 02/08/2019   Procedure: SAVORY DILATION;  Surgeon: Irving Copas., MD;  Location: [facility];  Service: Gastroenterology;  Laterality: N/A;   TOOTH EXTRACTION Bilateral 01/31/2018   Procedure: DENTAL RESTORATION/EXTRACTIONS;  Surgeon: Diona Browner, DDS;  Location: [facility];  Service: Oral Surgery;  Laterality: Bilateral;   TUBAL LIGATION     UPPER GI ENDOSCOPY N/A 02/15/2017   Procedure: UPPER GI ENDOSCOPY;  Surgeon: Clovis Riley, MD;  Location: [facility];  Service: General;  Laterality: N/A;     OB History   No obstetric history on file.     Family History  Problem Relation Age of Onset   Diabetes Mother    Hypertension Mother    Diabetes Father    Hypertension Father     Social History   Tobacco Use   Smoking status: Every Day    Packs/day: 1.00    Years: 29.00    Pack years: 29.00    Types: Cigarettes   Smokeless tobacco: Never  Vaping Use   Vaping Use: Never used  Substance Use Topics   Alcohol use: No   Drug use: Yes    Types: IV, Cocaine    Home Medications Prior to Admission medications   Medication Sig Start Date End Date Taking? Authorizing Provider  acetaminophen (TYLENOL) 500 MG tablet Take 1 tablet (500 mg total) by mouth every 6 (six) hours as needed for moderate pain. Patient taking differently: Take 1,500 mg by mouth every 6 (six) hours as needed for moderate pain or mild pain. 01/31/18  Yes Diona Browner, DMD   ibuprofen (ADVIL) 200 MG tablet Take 800 mg by mouth every 6 (six) hours as needed for moderate pain.   Yes [provider]    Allergies    Ketorolac tromethamine and Methocarbamol  Review of Systems   Review of Systems  Constitutional:  Negative for chills and fever.  HENT:  Negative for congestion.   Eyes:  Negative for pain.  Respiratory:  Negative for cough and shortness of breath.   Cardiovascular:  Negative for chest pain and leg swelling.  Gastrointestinal:  Positive for abdominal pain and nausea. Negative for diarrhea and vomiting.  Genitourinary:  Negative for dysuria.  Musculoskeletal:  Negative for myalgias.  Skin:  Negative for rash.  Neurological:  Negative for dizziness and headaches.   Physical Exam Updated Vital Signs BP (!) 191/119  Pulse 82   Temp 98.6 F (37 C) (Oral)   Resp 17   Ht '5\' 9"'$  (1.753 m)   Wt 63.5 kg   SpO2 97%   BMI 20.67 kg/m   Physical Exam Vitals and nursing note reviewed.  Constitutional:      General: She is in acute distress.     Comments: Patient is a chronically ill-appearing 45 year old female appears older than stated age.  HENT:     Head: Normocephalic and atraumatic.     Nose: Nose normal.     Mouth/Throat:     Mouth: Mucous membranes are dry.     Comments: Poor dentition Eyes:     General: No scleral icterus. Cardiovascular:     Rate and Rhythm: Normal rate and regular rhythm.     Pulses: Normal pulses.     Heart sounds: Normal heart sounds.  Pulmonary:     Effort: Pulmonary effort is normal. No respiratory distress.     Breath sounds: No wheezing.  Abdominal:     Palpations: Abdomen is soft.     Tenderness: There is abdominal tenderness.     Comments: Large remote, well-healed surgical scar midline abdomen.  Abdomen is soft but diffusely tender to palpation.  There is no focal abdominal tenderness.  Patient is voluntarily guarding which makes examination difficult.  No CVA tenderness.  No bruising.   Musculoskeletal:     Cervical back: Normal range of motion.     Right lower leg: No edema.     Left lower leg: No edema.  Skin:    General: Skin is warm and dry.     Capillary Refill: Capillary refill takes less than 2 seconds.  Neurological:     Mental Status: She is alert. Mental status is at baseline.  Psychiatric:        Mood and Affect: Mood normal.        Behavior: Behavior normal.    ED Results / Procedures / Treatments   Labs (all labs ordered are listed, but only abnormal results are displayed) Labs Reviewed  CBC WITH DIFFERENTIAL/PLATELET - Abnormal; Notable for the following components:      Result Value   Hemoglobin 10.6 (*)    HCT 32.5 (*)    MCV 65.1 (*)    MCH 21.2 (*)    RDW 20.5 (*)    All other components within normal limits  COMPREHENSIVE METABOLIC PANEL - Abnormal; Notable for the following components:   Sodium 133 (*)    Chloride 97 (*)    Calcium 8.8 (*)    Albumin 2.8 (*)    All other components within normal limits  URINALYSIS, ROUTINE W REFLEX MICROSCOPIC - Abnormal; Notable for the following components:   Color, Urine AMBER (*)    APPearance HAZY (*)    Nitrite POSITIVE (*)    Leukocytes,Ua LARGE (*)    Bacteria, UA MANY (*)    All other components within normal limits  URINE CULTURE  LIPASE, BLOOD  RAPID URINE DRUG SCREEN, HOSP PERFORMED  I-STAT BETA HCG BLOOD, ED ([facility], [facility], [facility] ONLY)  POC OCCULT BLOOD, ED  TROPONIN I (HIGH SENSITIVITY)    EKG None  Radiology CT ABDOMEN PELVIS W CONTRAST  Result Date: 01/13/2021 CLINICAL DATA:  45 year old female with history of abdominal pain shooting up into her chest for 1 week. EXAM: CT ABDOMEN AND PELVIS WITH CONTRAST TECHNIQUE: Multidetector CT imaging of the abdomen and pelvis was performed using the standard protocol following bolus administration of  intravenous contrast. CONTRAST:  2m OMNIPAQUE IOHEXOL 350 MG/ML SOLN COMPARISON:  CT the abdomen and pelvis 02/08/2019. FINDINGS: Lower chest:  Scattered areas of mild cylindrical bronchiectasis with some associated peribronchovascular ground-glass attenuation micro nodularity and additional small pulmonary nodules scattered throughout the visualized lung bases, largest of which measures 5 mm in the left lower lobe (axial image 19 of series 6). Areas of scarring and/or subsegmental atelectasis in the lower lobes of the lungs bilaterally. Atherosclerotic calcifications in the thoracic aorta. Hepatobiliary: In the periphery of the right lobe of the liver between segments 5 and 8 (axial image 21 of series 2) there is a small subcapsular lesion measuring 1.2 x 0.5 cm, too small to characterize, but similar to the prior study and statistically likely to represent a cyst. No other larger more suspicious appearing hepatic lesions are noted. Status post cholecystectomy. Minimal intrahepatic biliary ductal dilatation and slight prominence of the common bile duct which measures 1 cm in the porta hepatis likely reflects benign post cholecystectomy physiology. Pancreas: No pancreatic mass. No pancreatic ductal dilatation. No pancreatic or peripancreatic fluid collections or inflammatory changes. Spleen: Spleen is mildly enlarged measuring 10.5 x 4.9 x 14.7 cm (estimated splenic volume of 378 mL). Adrenals/Urinary Tract: Extensive cortical thinning is noted in the left kidney. Right kidney and bilateral adrenal glands are normal in appearance. No hydroureteronephrosis. Small amount of gas non dependently in the lumen of the urinary bladder. Urinary bladder is otherwise normal in appearance. Stomach/Bowel: Postoperative changes of Roux-en-Y gastric bypass. Adjacent to the anastomosis there is a round structure in the left upper quadrant of the abdomen (axial image 25 of series 2) which has a very thickened hazy irregular wall, concerning for an ulcer. It is uncertain whether this arises directly from the anastomotic suture line or is within the adjacent portions of  either the small bowel or stomach. There is a large amount of surrounding inflammation, very intimately associated with the proximal small bowel, well appreciated anteriorly on axial image 28 of series 2. No well-defined fluid collection is identified in this region. No pathologic dilatation of small bowel or colon. Appendix is not confidently identified may be surgically absent or atrophic. Vascular/Lymphatic: Aortic atherosclerosis, without evidence of aneurysm or dissection in the abdominal or pelvic vasculature. No lymphadenopathy noted in the abdomen or pelvis. Reproductive: Uterus and ovaries are a trophic. Other: Mild diffuse mesenteric edema. Small volume of ascites. No pneumoperitoneum. Musculoskeletal: Mild diffuse body wall edema. Status post laminectomy at L4-L5. Multilevel degenerative disc disease, most severe at L3-L4 where there are advanced discogenic endplate changes. There are no aggressive appearing lytic or blastic lesions noted in the visualized portions of the skeleton. IMPRESSION: 1. Findings are highly concerning for large perianastomotic ulcer in this patient who is status post Roux-en-Y gastric bypass, as detailed above. 2. Anasarca. 3. Compared to the prior examination there is extensive thinning of the cortex of the left kidney, presumably scarring from prior obstruction/infection. 4. Small amount of gas in the nondependent portion of the lumen of the urinary bladder on today's examination. If there has been recent catheterization for urinalysis, this is presumably iatrogenic. In the absence of a recent history of catheterization, further evaluation with urinalysis would be recommended to exclude the possibility of urinary tract infection with gas-forming organisms. 5. Findings in the lung bases which could be seen in the setting of chronic indolent atypical infectious process such as MAI (mycobacterium avium intracellulare). Nonemergent outpatient referral to Pulmonology for further  clinical evaluation is suggested.  6. Mild splenomegaly. 7. Additional incidental findings, as above. Electronically Signed   By: Vinnie Langton M.D.   On: 01/13/2021 12:12    Procedures Procedures   Medications Ordered in ED Medications  pantoprazole (PROTONIX) injection 40 mg (has no administration in time range)  metoCLOPramide (REGLAN) injection 10 mg (10 mg Intravenous Given 01/13/21 0925)  diphenhydrAMINE (BENADRYL) capsule 25 mg (25 mg Oral Given 01/13/21 0912)  sodium chloride 0.9 % bolus 1,000 mL (0 mLs Intravenous Stopped 01/13/21 1053)  morphine 4 MG/ML injection 4 mg (4 mg Intravenous Given 01/13/21 0924)  pantoprazole (PROTONIX) injection 40 mg (40 mg Intravenous Given 01/13/21 0924)  iohexol (OMNIPAQUE) 350 MG/ML injection 80 mL (80 mLs Intravenous Contrast Given 01/13/21 1100)  amLODipine (NORVASC) tablet 10 mg (10 mg Oral Given 01/13/21 1158)  dicyclomine (BENTYL) capsule 20 mg (20 mg Oral Given 01/13/21 1158)  morphine 2 MG/ML injection 2 mg (2 mg Intravenous Given 01/13/21 1158)  cefTRIAXone (ROCEPHIN) 1 g in sodium chloride 0.9 % 100 mL IVPB (1 g Intravenous New Bag/Given 01/13/21 1243)  hydrALAZINE (APRESOLINE) injection 10 mg (10 mg Intravenous Given 01/13/21 1242)    ED Course  I have reviewed the triage vital signs and the nursing notes.  Pertinent labs & imaging results that were available during my care of the patient were reviewed by me and considered in my medical decision making (see chart for details).  Clinical Course as of 01/13/21 1543  Tue Jan 13, 2021  1221 IMPRESSION: 1. Findings are highly concerning for large perianastomotic ulcer in this patient who is status post Roux-en-Y gastric bypass, as detailed above. 2. Anasarca. 3. Compared to the prior examination there is extensive thinning of the cortex of the left kidney, presumably scarring from prior obstruction/infection. 4. Small amount of gas in the nondependent portion of the lumen of the urinary  bladder on today's examination. If there has been recent catheterization for urinalysis, this is presumably iatrogenic. In the absence of a recent history of catheterization, further evaluation with urinalysis would be recommended to exclude the possibility of urinary tract infection with gas-forming organisms. 5. Findings in the lung bases which could be seen in the setting of chronic indolent atypical infectious process such as MAI (mycobacterium avium intracellulare). Nonemergent outpatient referral to Pulmonology for further clinical evaluation is suggested. 6. Mild splenomegaly. 7. Additional incidental findings, as above. [WF]    Clinical Course User Index [WF] Tedd Sias, [place]   MDM Rules/Calculators/A&P                           Patient is a 45 year old female history of polysubstance abuse cocaine and heroin she is here in the ER today for ongoing symptoms of 1 week of epigastric abdominal pain she is quite tender in her abdomen will obtain CT imaging CT imaging shows ulceration proximal to the Roux-en-Y bypass this perianastomotic ulcer was discussed with gastroenterology Dr. Benson Norway.  Patient is being admitted to the hospital for excessive/uncontrolled hypertension, urinary tract infection, complicated ulceration of Roux-en-Y bypass.  Fecal occult was negative.  CBC without leukocytosis, ongoing anemia.  CMP unremarkable.  Urinalysis positive for bacteria leukocytes and nitrates.  COVID influenza obtained and pending.  HCG negative for pregnancy.  CT abdomen discussed above.  I discussed this case with my attending physician who cosigned this note including patient's presenting symptoms, physical exam, and planned diagnostics and interventions. Attending physician stated agreement with plan or made changes to plan which  were implemented.   Attending physician assessed patient at bedside.   Patient admitted to the hospital with GI consultation.  Final Clinical Impression(s) /  ED Diagnoses Final diagnoses:  Epigastric pain  Gastric ulcer without hemorrhage or perforation, unspecified chronicity  Uncontrolled hypertension  Polysubstance abuse ([place])  Acute cystitis without hematuria    Rx / DC Orders ED Discharge Orders     None        Tedd Sias, [place] 01/13/21 1545    Tegeler, Gwenyth Allegra, MD 01/13/21 [phone]

## 2021-01-13 NOTE — ED Notes (Signed)
Called ICU to ask them to put in the purple man for handoff.

## 2021-01-13 NOTE — Consult Note (Signed)
[facility] UNASSIGNED CONSULT  Reason for Consult: Anastomatic ulcer, epigastric pain Referring Physician: [facility]  [name] HPI: This is a 45 year old female with a PMH of Roux-en-Y gastric bypass with anastomotic ulcers, polypsubstance abuse, HTN, anemia, and Bipolar disorder admitted for complaints of severe epigastric pain and severe hypertension.  She states that her pain started one week ago and she this prior pain the past.  An EGD was performed on 09/15/2017 and 02/08/2019 with findings of some anastomotic ulcers which were clean-based.  She reports that medications help to control and resolve her pain the past, but her pain only recurred.  Her blood pressure in the ER today was 210/125 mmHg and she used cocaine and heroine 2 days ago.  She denies any issues with hematochezia or melena, but she does report having a mild amount of hematemesis.  The CT scan of the abdomen does show an anastomotic ulcer, which is different from the prior CT scan on 02/08/2019.  Past Medical History:  Diagnosis Date   Arthritis    Back and legs   Bipolar disorder (HCC)    Chronic back pain    Depression    DVT (deep venous thrombosis) (HCC)    early 20's leg   History of blood transfusion    Hypertension    Migraine headache    Neuropathy    Pneumonia    Positive PPD    x 2 last time 09/2017   Sciatica    Stress incontinence     Past Surgical History:  Procedure Laterality Date   ALVEOLOPLASTY Bilateral 01/31/2018   Procedure: ALVEOLOPLASTY;  Surgeon: Diona Browner, DDS;  Location: [place];  Service: Oral Surgery;  Laterality: Bilateral;   ANKLE SURGERY Left 1992   with hardware   BACK SURGERY     BIOPSY  02/08/2019   Procedure: BIOPSY;  Surgeon: Rush Landmark Telford Nab., MD;  Location: [facility];  Service: Gastroenterology;;   CESAREAN SECTION     CHOLECYSTECTOMY N/A 02/15/2017   Procedure: LAPAROSCOPIC CHOLECYSTECTOMY;  Surgeon: Clovis Riley, MD;  Location: [facility];  Service: General;   Laterality: N/A;   ENTEROSCOPY N/A 02/08/2019   Procedure: ENTEROSCOPY;  Surgeon: Rush Landmark Telford Nab., MD;  Location: [facility];  Service: Gastroenterology;  Laterality: N/A;   ESOPHAGOGASTRODUODENOSCOPY (EGD) WITH PROPOFOL N/A 09/15/2017   Procedure: ESOPHAGOGASTRODUODENOSCOPY (EGD) WITH PROPOFOL;  Surgeon: Clarene Essex, MD;  Location: [facility];  Service: Endoscopy;  Laterality: N/A;   ESOPHAGOGASTRODUODENOSCOPY (EGD) WITH PROPOFOL N/A 02/08/2019   Procedure: ESOPHAGOGASTRODUODENOSCOPY (EGD) WITH PROPOFOL;  Surgeon: Rush Landmark Telford Nab., MD;  Location: [facility];  Service: Gastroenterology;  Laterality: N/A;   GASTRIC BYPASS     INCISION AND DRAINAGE OF PERITONSILLAR ABCESS Left 08/13/2017   Procedure: INCISION AND DRAINAGE OF LEFT NECK ABSCESS;  Surgeon: Melida Quitter, MD;  Location: [facility];  Service: ENT;  Laterality: Left;   LAPAROSCOPIC LYSIS OF ADHESIONS  02/15/2017   Procedure: LAPAROSCOPIC LYSIS OF ADHESIONS;  Surgeon: Clovis Riley, MD;  Location: [facility];  Service: General;;   LUMBAR DISC SURGERY     LUMBAR FUSION     SAVORY DILATION N/A 02/08/2019   Procedure: SAVORY DILATION;  Surgeon: Irving Copas., MD;  Location: [facility];  Service: Gastroenterology;  Laterality: N/A;   TOOTH EXTRACTION Bilateral 01/31/2018   Procedure: DENTAL RESTORATION/EXTRACTIONS;  Surgeon: Diona Browner, DDS;  Location: [place];  Service: Oral Surgery;  Laterality: Bilateral;   TUBAL LIGATION     UPPER GI ENDOSCOPY N/A 02/15/2017  Procedure: UPPER GI ENDOSCOPY;  Surgeon: Clovis Riley, MD;  Location: [facility];  Service: General;  Laterality: N/A;    Family History  Problem Relation Age of Onset   Diabetes Mother    Hypertension Mother    Diabetes Father    Hypertension Father     Social History:  reports that she has been smoking cigarettes. She has a 29.00 pack-year smoking history. She has never used smokeless tobacco. She reports current drug use. Drugs: IV and Cocaine.  She reports that she does not drink alcohol.  Allergies:  Allergies  Allergen Reactions   Ketorolac Tromethamine Itching and Nausea And Vomiting   Methocarbamol Diarrhea    Medications: Scheduled:  [START ON 01/14/2021] amLODipine  10 mg Oral Daily   cloNIDine  0.1 mg Oral BID   enalaprilat  1.25 mg Intravenous Q6H   pantoprazole (PROTONIX) IV  40 mg Intravenous Q12H   sucralfate  1 g Oral TID WC & HS   Continuous:  sodium chloride     [START ON 01/14/2021] cefTRIAXone (ROCEPHIN)  IV      Results for orders placed or performed during the hospital encounter of 01/13/21 (from the past 24 hour(s))  CBC with Differential     Status: Abnormal   Collection Time: 01/13/21  3:49 AM  Result Value Ref Range   WBC 7.6 4.0 - 10.5 K/uL   RBC 4.99 3.87 - 5.11 MIL/uL   Hemoglobin 10.6 (L) 12.0 - 15.0 g/dL   HCT 32.5 (L) 36.0 - 46.0 %   MCV 65.1 (L) 80.0 - 100.0 fL   MCH 21.2 (L) 26.0 - 34.0 pg   MCHC 32.6 30.0 - 36.0 g/dL   RDW 20.5 (H) 11.5 - 15.5 %   Platelets 343 150 - 400 K/uL   nRBC 0.0 0.0 - 0.2 %   Neutrophils Relative % 62 %   Neutro Abs 4.7 1.7 - 7.7 K/uL   Lymphocytes Relative 30 %   Lymphs Abs 2.3 0.7 - 4.0 K/uL   Monocytes Relative 7 %   Monocytes Absolute 0.5 0.1 - 1.0 K/uL   Eosinophils Relative 0 %   Eosinophils Absolute 0.0 0.0 - 0.5 K/uL   Basophils Relative 1 %   Basophils Absolute 0.1 0.0 - 0.1 K/uL   Immature Granulocytes 0 %   Abs Immature Granulocytes 0.03 0.00 - 0.07 K/uL   Target Cells PRESENT   Comprehensive metabolic panel     Status: Abnormal   Collection Time: 01/13/21  3:49 AM  Result Value Ref Range   Sodium 133 (L) 135 - 145 mmol/L   Potassium 4.0 3.5 - 5.1 mmol/L   Chloride 97 (L) 98 - 111 mmol/L   CO2 29 22 - 32 mmol/L   Glucose, Bld 95 70 - 99 mg/dL   BUN 17 6 - 20 mg/dL   Creatinine, Ser 0.75 0.44 - 1.00 mg/dL   Calcium 8.8 (L) 8.9 - 10.3 mg/dL   Total Protein 6.5 6.5 - 8.1 g/dL   Albumin 2.8 (L) 3.5 - 5.0 g/dL   AST 22 15 - 41 U/L   ALT  15 0 - 44 U/L   Alkaline Phosphatase 118 38 - 126 U/L   Total Bilirubin 0.4 0.3 - 1.2 mg/dL   GFR, Estimated >60 >60 mL/min   Anion gap 7 5 - 15  Lipase, blood     Status: None   Collection Time: 01/13/21  3:49 AM  Result Value Ref Range   Lipase 23 11 -  51 U/L  Troponin I (High Sensitivity)     Status: None   Collection Time: 01/13/21  9:11 AM  Result Value Ref Range   Troponin I (High Sensitivity) 7 <18 ng/L  Urinalysis, Routine w reflex microscopic     Status: Abnormal   Collection Time: 01/13/21  9:12 AM  Result Value Ref Range   Color, Urine AMBER (A) YELLOW   APPearance HAZY (A) CLEAR   Specific Gravity, Urine 1.015 1.005 - 1.030   pH 6.0 5.0 - 8.0   Glucose, UA NEGATIVE NEGATIVE mg/dL   Hgb urine dipstick NEGATIVE NEGATIVE   Bilirubin Urine NEGATIVE NEGATIVE   Ketones, ur NEGATIVE NEGATIVE mg/dL   Protein, ur NEGATIVE NEGATIVE mg/dL   Nitrite POSITIVE (A) NEGATIVE   Leukocytes,Ua LARGE (A) NEGATIVE   RBC / HPF 6-10 0 - 5 RBC/hpf   WBC, UA 21-50 0 - 5 WBC/hpf   Bacteria, UA MANY (A) NONE SEEN   Squamous Epithelial / LPF 0-5 0 - 5   Mucus PRESENT   Rapid urine drug screen (hospital performed)     Status: Abnormal   Collection Time: 01/13/21  9:12 AM  Result Value Ref Range   Opiates NONE DETECTED NONE DETECTED   Cocaine POSITIVE (A) NONE DETECTED   Benzodiazepines NONE DETECTED NONE DETECTED   Amphetamines POSITIVE (A) NONE DETECTED   Tetrahydrocannabinol NONE DETECTED NONE DETECTED   Barbiturates NONE DETECTED NONE DETECTED  I-Stat beta hCG blood, ED ([facility], [facility], [facility] only)     Status: None   Collection Time: 01/13/21  9:36 AM  Result Value Ref Range   I-stat hCG, quantitative <5.0 <5 mIU/mL   Comment 3          POC occult blood, ED     Status: None   Collection Time: 01/13/21 12:56 PM  Result Value Ref Range   Fecal Occult Bld NEGATIVE NEGATIVE  POC occult blood, ED     Status: None   Collection Time: 01/13/21  2:12 PM  Result Value Ref Range   Fecal Occult Bld  NEGATIVE NEGATIVE     CT ABDOMEN PELVIS W CONTRAST  Result Date: 01/13/2021 CLINICAL DATA:  45 year old female with history of abdominal pain shooting up into her chest for 1 week. EXAM: CT ABDOMEN AND PELVIS WITH CONTRAST TECHNIQUE: Multidetector CT imaging of the abdomen and pelvis was performed using the standard protocol following bolus administration of intravenous contrast. CONTRAST:  42m OMNIPAQUE IOHEXOL 350 MG/ML SOLN COMPARISON:  CT the abdomen and pelvis 02/08/2019. FINDINGS: Lower chest: Scattered areas of mild cylindrical bronchiectasis with some associated peribronchovascular ground-glass attenuation micro nodularity and additional small pulmonary nodules scattered throughout the visualized lung bases, largest of which measures 5 mm in the left lower lobe (axial image 19 of series 6). Areas of scarring and/or subsegmental atelectasis in the lower lobes of the lungs bilaterally. Atherosclerotic calcifications in the thoracic aorta. Hepatobiliary: In the periphery of the right lobe of the liver between segments 5 and 8 (axial image 21 of series 2) there is a small subcapsular lesion measuring 1.2 x 0.5 cm, too small to characterize, but similar to the prior study and statistically likely to represent a cyst. No other larger more suspicious appearing hepatic lesions are noted. Status post cholecystectomy. Minimal intrahepatic biliary ductal dilatation and slight prominence of the common bile duct which measures 1 cm in the porta hepatis likely reflects benign post cholecystectomy physiology. Pancreas: No pancreatic mass. No pancreatic ductal dilatation. No pancreatic or peripancreatic fluid collections  or inflammatory changes. Spleen: Spleen is mildly enlarged measuring 10.5 x 4.9 x 14.7 cm (estimated splenic volume of 378 mL). Adrenals/Urinary Tract: Extensive cortical thinning is noted in the left kidney. Right kidney and bilateral adrenal glands are normal in appearance. No hydroureteronephrosis.  Small amount of gas non dependently in the lumen of the urinary bladder. Urinary bladder is otherwise normal in appearance. Stomach/Bowel: Postoperative changes of Roux-en-Y gastric bypass. Adjacent to the anastomosis there is a round structure in the left upper quadrant of the abdomen (axial image 25 of series 2) which has a very thickened hazy irregular wall, concerning for an ulcer. It is uncertain whether this arises directly from the anastomotic suture line or is within the adjacent portions of either the small bowel or stomach. There is a large amount of surrounding inflammation, very intimately associated with the proximal small bowel, well appreciated anteriorly on axial image 28 of series 2. No well-defined fluid collection is identified in this region. No pathologic dilatation of small bowel or colon. Appendix is not confidently identified may be surgically absent or atrophic. Vascular/Lymphatic: Aortic atherosclerosis, without evidence of aneurysm or dissection in the abdominal or pelvic vasculature. No lymphadenopathy noted in the abdomen or pelvis. Reproductive: Uterus and ovaries are a trophic. Other: Mild diffuse mesenteric edema. Small volume of ascites. No pneumoperitoneum. Musculoskeletal: Mild diffuse body wall edema. Status post laminectomy at L4-L5. Multilevel degenerative disc disease, most severe at L3-L4 where there are advanced discogenic endplate changes. There are no aggressive appearing lytic or blastic lesions noted in the visualized portions of the skeleton. IMPRESSION: 1. Findings are highly concerning for large perianastomotic ulcer in this patient who is status post Roux-en-Y gastric bypass, as detailed above. 2. Anasarca. 3. Compared to the prior examination there is extensive thinning of the cortex of the left kidney, presumably scarring from prior obstruction/infection. 4. Small amount of gas in the nondependent portion of the lumen of the urinary bladder on today's examination.  If there has been recent catheterization for urinalysis, this is presumably iatrogenic. In the absence of a recent history of catheterization, further evaluation with urinalysis would be recommended to exclude the possibility of urinary tract infection with gas-forming organisms. 5. Findings in the lung bases which could be seen in the setting of chronic indolent atypical infectious process such as MAI (mycobacterium avium intracellulare). Nonemergent outpatient referral to Pulmonology for further clinical evaluation is suggested. 6. Mild splenomegaly. 7. Additional incidental findings, as above. Electronically Signed   By: Vinnie Langton M.D.   On: 01/13/2021 12:12    ROS:  As stated above in the HPI otherwise negative.  Blood pressure (!) 213/118, pulse 86, temperature (!) 100.8 F (38.2 C), temperature source Oral, resp. rate 18, height '5\' 9"'$  (1.753 m), weight 63.5 kg, SpO2 96 %.    PE: Gen: Fetal position, in pain HEENT:  [place]/AT, EOMI Neck: Supple, no LAD Lungs: CTA Bilaterally CV: RRR, tachycardic without M/G/R ABD: Soft, tender in the epigastric region, +BS Ext: No C/C/E  Assessment/Plan: 1) Anastomotic ulcer. 2) Epigastric pain. 3) Cocaine abuse. 4) Severe HTN.   The patient will need an EGD, but her HTN needs to be controlled.  In the meantime she needs to be treated with medication for her anastomotic ulcer.  It is visible during this CT scan.  It may be that she will require surgical revision.  Plan: 1) PPI BID. 2) Sucralfate. 3) Control HTN. 4) EGD Thursday or Friday pending her blood pressure control.  Cairo Agostinelli D 01/13/2021,  4:30 PM

## 2021-01-13 NOTE — ED Notes (Signed)
Patient speaking with someone on the phone asking them to bring her a cigarette or cigar. Patient told she cannot go outside to smoke when someone arrives.  Patient stated that she is leaving because we are not doing anything for her.  Patient has literally been so out of it that she does not realize all the medications that she has been given.  [place], MD at bedside speaking with patient.

## 2021-01-13 NOTE — ED Notes (Signed)
Pt was found to have left her room this nurse went outside looking for her and found her to be turning around in circles . Was also notified that she had been in the bathroom smoking. Notified patient that if she left her room again to go outside she would be considered AMA. Security notified as well as Camera operator. Pt denies smoking in the bathroom as well. Pt is being taken upstairs at this time and while being rolled by this nurse was eating chips.  Pt again informed that she was not to be eating or drinking and she replies with, "man yall need to stop, do you know when the last time I ate was." Upstairs nurse also notified.

## 2021-01-13 NOTE — H&P (Signed)
History and Physical    [name] EXB:[id number] DOB: 1975/10/11 DOA: 01/13/2021  PCP: Patient, No Pcp Per (Inactive)   Patient coming from: home  I have personally briefly reviewed patient's old medical records in [place]  Chief Complaint: abd. pain  HPI: COLETTA [name] is a 45 y.o. female with medical history significant of gastric bypass, IV drug use, but snorted heroin, cocaine abuse most recently 2 days ago, presents with abdominal pain sharp epigastric in nature for the last week.  Patient endorses nausea as well.  Patient denies NSAID or aspirin use.  Patient states she has been taking Tylenol without relief.  Of note patient s/p been on blood pressure medications medications but has been off of them due to losing her insurance.  Patient states she no longer has a GI doctor does not recall the last gastroenterologist she saw.  Patient does have a history of esophagitis with known gastric ulcer bipolar chronic pain and hypertension.  Patient also states epigastric pain is burning in nature.  Patient states she had a gastric sleeve in 2012.  Patient denies any bloody stools or bright red blood per rectum or melena.  Patient does endorse some nausea and decreased p.o. intake.  History of cholecystectomy in 2020.  Patient states she did snort cocaine and heroin.  Heart rate 80s blood pressures was 200 but slowly improving with better pain control in the 180s and 102V now systolic.  Fecal occult stool is negative, pregnancy test is negative.  EKG sinus rhythm.  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  Patient denies any history of MAC. Spleenomegally.  Patient also endorses some urine odor in frequency.  UDS is positive for cocaine as well as amphetamines.  UA positive for leuks and nitrites.  CBC white count of 7.6 H&H 10 and 32 platelets 343.  CMP significant for sodium 133 chloride 97  calcium 8.8 albumin 2.8.  Patient given Protonix 40 mg IV twice daily, patient given Rocephin, amlodipine, Bentyl, Benadryl, hydralazine, Reglan, morphine and a liter of fluids.  PA Weider discussed patient with Dr. Benson Norway.  Ct abd:  ''IMPRESSION: 1. Findings are highly concerning for large perianastomotic ulcer in this patient who is status post Roux-en-Y gastric bypass, as detailed above. 2. Anasarca. 3. Compared to the prior examination there is extensive thinning of the cortex of the left kidney, presumably scarring from prior obstruction/infection. 4. Small amount of gas in the nondependent portion of the lumen of the urinary bladder on today's examination. If there has been recent catheterization for urinalysis, this is presumably iatrogenic. In the absence of a recent history of catheterization, further evaluation with urinalysis would be recommended to exclude the possibility of urinary tract infection with gas-forming organisms. 5. Findings in the lung bases which could be seen in the setting of chronic indolent atypical infectious process such as MAI (mycobacterium avium intracellulare). Nonemergent outpatient referral to Pulmonology for further clinical evaluation is suggested. 6. Mild splenomegaly. 7. Additional incidental findings, as above.'' ED Course: As outlined above.  Review of Systems: As per HPI otherwise all other systems reviewed and are negative.   Past Medical History:  Diagnosis Date   Arthritis    Back and legs   Bipolar disorder (HCC)    Chronic back pain    Depression    DVT (deep venous thrombosis) ([place])    early 20's leg   History of blood  transfusion    Hypertension    Migraine headache    Neuropathy    Pneumonia    Positive PPD    x 2 last time 09/2017   Sciatica    Stress incontinence     Past Surgical History:  Procedure Laterality Date   ALVEOLOPLASTY Bilateral 01/31/2018   Procedure: ALVEOLOPLASTY;  Surgeon: Diona Browner, DDS;   Location: [facility];  Service: Oral Surgery;  Laterality: Bilateral;   ANKLE SURGERY Left 1992   with hardware   BACK SURGERY     BIOPSY  02/08/2019   Procedure: BIOPSY;  Surgeon: Rush Landmark Telford Nab., MD;  Location: [facility];  Service: Gastroenterology;;   CESAREAN SECTION     CHOLECYSTECTOMY N/A 02/15/2017   Procedure: LAPAROSCOPIC CHOLECYSTECTOMY;  Surgeon: Clovis Riley, MD;  Location: [facility];  Service: General;  Laterality: N/A;   ENTEROSCOPY N/A 02/08/2019   Procedure: ENTEROSCOPY;  Surgeon: Rush Landmark Telford Nab., MD;  [facility];  Service: Gastroenterology;  Laterality: N/A;   ESOPHAGOGASTRODUODENOSCOPY (EGD) WITH PROPOFOL N/A 09/15/2017   Procedure: ESOPHAGOGASTRODUODENOSCOPY (EGD) WITH PROPOFOL;  Surgeon: Clarene Essex, MD;  [facility];  Service: Endoscopy;  Laterality: N/A;   ESOPHAGOGASTRODUODENOSCOPY (EGD) WITH PROPOFOL N/A 02/08/2019   Procedure: ESOPHAGOGASTRODUODENOSCOPY (EGD) WITH PROPOFOL;  Surgeon: Rush Landmark Telford Nab., MD;  [facility];  Service: Gastroenterology;  Laterality: N/A;   GASTRIC BYPASS     INCISION AND DRAINAGE OF PERITONSILLAR ABCESS Left 08/13/2017   Procedure: INCISION AND DRAINAGE OF LEFT NECK ABSCESS;  Surgeon: Melida Quitter, MD;  [facility];  Service: ENT;  Laterality: Left;   LAPAROSCOPIC LYSIS OF ADHESIONS  02/15/2017   Procedure: LAPAROSCOPIC LYSIS OF ADHESIONS;  Surgeon: Clovis Riley, MD;  [facility];  Service: General;;   LUMBAR DISC SURGERY     LUMBAR FUSION     SAVORY DILATION N/A 02/08/2019   Procedure: SAVORY DILATION;  Surgeon: Irving Copas., MD;  [facility];  Service: Gastroenterology;  Laterality: N/A;   TOOTH EXTRACTION Bilateral 01/31/2018   Procedure: DENTAL RESTORATION/EXTRACTIONS;  Surgeon: Diona Browner, DDS;  Location: [facility];  Service: Oral Surgery;  Laterality: Bilateral;   TUBAL LIGATION     UPPER GI ENDOSCOPY N/A 02/15/2017   Procedure: UPPER GI ENDOSCOPY;   Surgeon: Clovis Riley, MD;  [facility];  Service: General;  Laterality: N/A;    Social History  reports that she has been smoking cigarettes. She has a 29.00 pack-year smoking history. She has never used smokeless tobacco. She reports current drug use. Drugs: IV and Cocaine. She reports that she does not drink alcohol.  Allergies  Allergen Reactions   Ketorolac Tromethamine Itching and Nausea And Vomiting   Methocarbamol Diarrhea    Family History  Problem Relation Age of Onset   Diabetes Mother    Hypertension Mother    Diabetes Father    Hypertension Father     Prior to Admission medications   Medication Sig Start Date End Date Taking? Authorizing Provider  acetaminophen (TYLENOL) 500 MG tablet Take 1 tablet (500 mg total) by mouth every 6 (six) hours as needed for moderate pain. Patient taking differently: Take 1,500 mg by mouth every 6 (six) hours as needed for moderate pain or mild pain. 01/31/18  Yes Diona Browner, DMD  ibuprofen (ADVIL) 200 MG tablet Take 800 mg by mouth every 6 (six) hours as needed for moderate pain.   Yes [provider]    Physical Exam: Vitals:   01/13/21 4010 01/13/21  1300 01/13/21 1330 01/13/21 1430  BP:  (!) 182/116 (!) 191/119 (!) 197/115  Pulse: 86 82 82 83  Resp: 16 (!) 26 17 (!) 26  Temp:      TempSrc:      SpO2: 94% 95% 97% 99%  Weight:      Height:        Constitutional: Moaning in bed, appears older than stated age Vitals:   01/13/21 1241 01/13/21 1300 01/13/21 1330 01/13/21 1430  BP:  (!) 182/116 (!) 191/119 (!) 197/115  Pulse: 86 82 82 83  Resp: 16 (!) 26 17 (!) 26  Temp:      TempSrc:      SpO2: 94% 95% 97% 99%  Weight:      Height:       Eyes: PERRL, lids and conjunctivae normal ENMT: Dry membranes are moist.  Poor dentition.  Normal external ears patent nares Neck: normal, supple, no masses, no thyromegaly Respiratory: clear to auscultation bilaterally, no wheezing, no crackles. Normal respiratory  effort. No accessory muscle use.  Cardiovascular: Regular rate and rhythm, no murmurs / rubs / gallops. No extremity edema. 2+ pedal pulses.  Abdomen: Hypoactive bowel sounds, epigastric tenderness, no rebound but guarding present  musculoskeletal: no clubbing / cyanosis. No joint deformity upper and lower extremities. Good ROM, no contractures. Normal muscle tone.  Skin: no rashes, lesions, ulcers. No induration, healed surgical scar Neurologic: CN 2-12 grossly intact. Sensation intact, DTR normal. Strength 5/5 in all 4.  Psychiatric: Normal judgment and insight. Alert and oriented x 3.  Anxious  Labs on Admission: I have personally reviewed following labs and imaging studies  CBC: Recent Labs  Lab 01/13/21 0349  WBC 7.6  NEUTROABS 4.7  HGB 10.6*  HCT 32.5*  MCV 65.1*  PLT 024    Basic Metabolic Panel: Recent Labs  Lab 01/13/21 0349  NA 133*  K 4.0  CL 97*  CO2 29  GLUCOSE 95  BUN 17  CREATININE 0.75  CALCIUM 8.8*    GFR: Estimated Creatinine Clearance: 87.1 mL/min (by C-G formula based on SCr of 0.75 mg/dL).  Liver Function Tests: Recent Labs  Lab 01/13/21 0349  AST 22  ALT 15  ALKPHOS 118  BILITOT 0.4  PROT 6.5  ALBUMIN 2.8*    Urine analysis:    Component Value Date/Time   COLORURINE AMBER (A) 01/13/2021 0912   APPEARANCEUR HAZY (A) 01/13/2021 0912   LABSPEC 1.015 01/13/2021 0912   PHURINE 6.0 01/13/2021 0912   GLUCOSEU NEGATIVE 01/13/2021 0912   HGBUR NEGATIVE 01/13/2021 0912   BILIRUBINUR NEGATIVE 01/13/2021 0912   KETONESUR NEGATIVE 01/13/2021 0912   PROTEINUR NEGATIVE 01/13/2021 0912   UROBILINOGEN 1.0 02/09/2015 1228   NITRITE POSITIVE (A) 01/13/2021 0912   LEUKOCYTESUR LARGE (A) 01/13/2021 0912    Radiological Exams on Admission: CT ABDOMEN PELVIS W CONTRAST  Result Date: 01/13/2021 CLINICAL DATA:  45 year old female with history of abdominal pain shooting up into her chest for 1 week. EXAM: CT ABDOMEN AND PELVIS WITH CONTRAST  TECHNIQUE: Multidetector CT imaging of the abdomen and pelvis was performed using the standard protocol following bolus administration of intravenous contrast. CONTRAST:  66m OMNIPAQUE IOHEXOL 350 MG/ML SOLN COMPARISON:  CT the abdomen and pelvis 02/08/2019. FINDINGS: Lower chest: Scattered areas of mild cylindrical bronchiectasis with some associated peribronchovascular ground-glass attenuation micro nodularity and additional small pulmonary nodules scattered throughout the visualized lung bases, largest of which measures 5 mm in the left lower lobe (axial image 19 of series 6).  Areas of scarring and/or subsegmental atelectasis in the lower lobes of the lungs bilaterally. Atherosclerotic calcifications in the thoracic aorta. Hepatobiliary: In the periphery of the right lobe of the liver between segments 5 and 8 (axial image 21 of series 2) there is a small subcapsular lesion measuring 1.2 x 0.5 cm, too small to characterize, but similar to the prior study and statistically likely to represent a cyst. No other larger more suspicious appearing hepatic lesions are noted. Status post cholecystectomy. Minimal intrahepatic biliary ductal dilatation and slight prominence of the common bile duct which measures 1 cm in the porta hepatis likely reflects benign post cholecystectomy physiology. Pancreas: No pancreatic mass. No pancreatic ductal dilatation. No pancreatic or peripancreatic fluid collections or inflammatory changes. Spleen: Spleen is mildly enlarged measuring 10.5 x 4.9 x 14.7 cm (estimated splenic volume of 378 mL). Adrenals/Urinary Tract: Extensive cortical thinning is noted in the left kidney. Right kidney and bilateral adrenal glands are normal in appearance. No hydroureteronephrosis. Small amount of gas non dependently in the lumen of the urinary bladder. Urinary bladder is otherwise normal in appearance. Stomach/Bowel: Postoperative changes of Roux-en-Y gastric bypass. Adjacent to the anastomosis there is  a round structure in the left upper quadrant of the abdomen (axial image 25 of series 2) which has a very thickened hazy irregular wall, concerning for an ulcer. It is uncertain whether this arises directly from the anastomotic suture line or is within the adjacent portions of either the small bowel or stomach. There is a large amount of surrounding inflammation, very intimately associated with the proximal small bowel, well appreciated anteriorly on axial image 28 of series 2. No well-defined fluid collection is identified in this region. No pathologic dilatation of small bowel or colon. Appendix is not confidently identified may be surgically absent or atrophic. Vascular/Lymphatic: Aortic atherosclerosis, without evidence of aneurysm or dissection in the abdominal or pelvic vasculature. No lymphadenopathy noted in the abdomen or pelvis. Reproductive: Uterus and ovaries are a trophic. Other: Mild diffuse mesenteric edema. Small volume of ascites. No pneumoperitoneum. Musculoskeletal: Mild diffuse body wall edema. Status post laminectomy at L4-L5. Multilevel degenerative disc disease, most severe at L3-L4 where there are advanced discogenic endplate changes. There are no aggressive appearing lytic or blastic lesions noted in the visualized portions of the skeleton. IMPRESSION: 1. Findings are highly concerning for large perianastomotic ulcer in this patient who is status post Roux-en-Y gastric bypass, as detailed above. 2. Anasarca. 3. Compared to the prior examination there is extensive thinning of the cortex of the left kidney, presumably scarring from prior obstruction/infection. 4. Small amount of gas in the nondependent portion of the lumen of the urinary bladder on today's examination. If there has been recent catheterization for urinalysis, this is presumably iatrogenic. In the absence of a recent history of catheterization, further evaluation with urinalysis would be recommended to exclude the possibility of  urinary tract infection with gas-forming organisms. 5. Findings in the lung bases which could be seen in the setting of chronic indolent atypical infectious process such as MAI (mycobacterium avium intracellulare). Nonemergent outpatient referral to Pulmonology for further clinical evaluation is suggested. 6. Mild splenomegaly. 7. Additional incidental findings, as above. Electronically Signed   By: Vinnie Langton M.D.   On: 01/13/2021 12:12    EKG: Independently reviewed.   Assessment/Plan Active Problems:   Anxiety   UTI (urinary tract infection)   Gastric ulcer   Ulcer at site of surgical anastomosis following bypass of stomach   Amphetamine abuse ([place])  Cocaine abuse ([place])   Heroin abuse ([place])   Hypertensive urgency   Tobacco abuse  45 year old female presents with abdominal pain found to have gastric ulcer and prior Roux-en-Y anastomosis. Gastric ulcer-we will continue Protonix twice daily, NPO except meds, follow-up GI consult  UTI-continue Rocephin and follow-up urine culture  Anxiety-worsened by recent cocaine, heroin and amphetamine abuse, will give as needed benzos Ativan  Cocaine heroin and amphetamine abuse-encourage cessation  Tobacco abuse patient smokes 1 pack/day, patient deferred nicotine patch  Hypertensive urgency-secondary to noncompliance as well as cocaine abuse we will give as needed Ativan, hydralazine, avoid beta-blockers, will give as needed clonidine  Disposition-admit to stepdown, telemetry, continue to monitor F/u outpt pulm for lung findings  DVT prophylaxis: scd Code Status:   full Family Communication:  na Disposition Plan:   Patient is from:  home  Anticipated DC to:  tbd  Anticipated DC date:  tbd  Anticipated DC barriers: tbd  Consults called:  Dr. Allene Pyo discussed w/ over the phone, will get egd once bp/comorbidities improved potentially Thursday/Friday Admission status:  Inpt sdu  Severity of Illness: The appropriate patient status  for this patient is INPATIENT. Inpatient status is judged to be reasonable and necessary in order to provide the required intensity of service to ensure the patient's safety. The patient's presenting symptoms, physical exam findings, and initial radiographic and laboratory data in the context of their chronic comorbidities is felt to place them at high risk for further clinical deterioration. Furthermore, it is not anticipated that the patient will be medically stable for discharge from the hospital within 2 midnights of admission. The following factors support the patient status of inpatient.   " The patient's presenting symptoms include as above. " The worrisome physical exam findings include as above. " The initial radiographic and laboratory data are worrisome because of as above. " The chronic co-morbidities include as above.   * I certify that at the point of admission it is my clinical judgment that the patient will require inpatient hospital care spanning beyond 2 midnights from the point of admission due to high intensity of service, high risk for further deterioration and high frequency of surveillance required.Jacqlyn Krauss MD [facility]  How to contact the [facility] Attending or Consulting provider [place] or covering provider during after hours [place], for this patient?   Check the care team in [facility] and look for a) attending/consulting [facility] provider listed and b) the [facility] team listed Log into [email or web address] and use [facility]'s universal password to access. If you do not have the password, please contact the hospital operator. Locate the [facility] provider you are looking for under [facility] and page to a number that you can be directly reached. If you still have difficulty reaching the provider, please page the [facility] (Director on Call) for the Hospitalists listed on amion for assistance.  01/13/2021, 2:39 PM

## 2021-01-13 NOTE — ED Triage Notes (Signed)
Pt complains of abdominal pain that shoots up into her chest x 1 week. Pt states that last time she had this pain, it was her gallbladder. Pt reports taking blood pressure medication but has stopped since she lost her insurance. Blood pressure 191/131.

## 2021-01-13 NOTE — ED Notes (Signed)
   01/13/21 1800  Vitals  Temp (!) 102.8 F (39.3 C)  Temp Source Oral  BP (!) 165/108  MAP (mmHg) 124  Pulse Rate (!) 109  ECG Heart Rate (!) 110  Resp (!) 24  Oxygen Therapy  SpO2 93 %  [place], MD made aware of patient current vitals.

## 2021-01-13 NOTE — ED Notes (Signed)
Ambulatory to restroom with steady gait. Requesting additional pain medications

## 2021-01-13 NOTE — ED Notes (Signed)
Patient when asked about drug use, admits that she use IV heroin and cocaine. Last heroin was a week ago and last cocaine use was a couple days ago.

## 2021-01-13 NOTE — ED Notes (Signed)
   01/13/21 1609  Vitals  BP (!) 213/118  MAP (mmHg) 147  Patient Position (if appropriate) Lying  Pulse Rate 86  ECG Heart Rate 85  Resp 18  MEWS COLOR  MEWS Score Color Yellow  Oxygen Therapy  SpO2 96 %  MEWS Score  MEWS Temp 0  MEWS Systolic 2  MEWS Pulse 0  MEWS RR 0  MEWS LOC 0  MEWS Score 2  [place], MD made aware of patient's current BP despite multiple medications given. New orders received.

## 2021-01-14 ENCOUNTER — Inpatient Hospital Stay (HOSPITAL_COMMUNITY)
Admission: EM | Admit: 2021-01-14 | Discharge: 2021-01-16 | DRG: 380 | Discharge status: Against Medical Advice | Payer: Medicaid Other | Attending: Internal Medicine | Admitting: Internal Medicine

## 2021-01-14 ENCOUNTER — Other Ambulatory Visit (HOSPITAL_COMMUNITY): Payer: Medicaid Other

## 2021-01-14 ENCOUNTER — Encounter (HOSPITAL_COMMUNITY): Payer: Self-pay | Admitting: Emergency Medicine

## 2021-01-14 DIAGNOSIS — Z9884 Bariatric surgery status: Secondary | ICD-10-CM

## 2021-01-14 DIAGNOSIS — E43 Unspecified severe protein-calorie malnutrition: Secondary | ICD-10-CM | POA: Diagnosis present

## 2021-01-14 DIAGNOSIS — F319 Bipolar disorder, unspecified: Secondary | ICD-10-CM | POA: Diagnosis present

## 2021-01-14 DIAGNOSIS — K283 Acute gastrojejunal ulcer without hemorrhage or perforation: Principal | ICD-10-CM | POA: Diagnosis present

## 2021-01-14 DIAGNOSIS — N3000 Acute cystitis without hematuria: Secondary | ICD-10-CM | POA: Diagnosis not present

## 2021-01-14 DIAGNOSIS — Z8249 Family history of ischemic heart disease and other diseases of the circulatory system: Secondary | ICD-10-CM

## 2021-01-14 DIAGNOSIS — Z888 Allergy status to other drugs, medicaments and biological substances status: Secondary | ICD-10-CM

## 2021-01-14 DIAGNOSIS — M47819 Spondylosis without myelopathy or radiculopathy, site unspecified: Secondary | ICD-10-CM | POA: Diagnosis present

## 2021-01-14 DIAGNOSIS — F419 Anxiety disorder, unspecified: Secondary | ICD-10-CM | POA: Diagnosis not present

## 2021-01-14 DIAGNOSIS — Z9119 Patient's noncompliance with other medical treatment and regimen: Secondary | ICD-10-CM

## 2021-01-14 DIAGNOSIS — F1510 Other stimulant abuse, uncomplicated: Secondary | ICD-10-CM | POA: Diagnosis not present

## 2021-01-14 DIAGNOSIS — G8929 Other chronic pain: Secondary | ICD-10-CM | POA: Diagnosis present

## 2021-01-14 DIAGNOSIS — I10 Essential (primary) hypertension: Secondary | ICD-10-CM | POA: Diagnosis present

## 2021-01-14 DIAGNOSIS — Z833 Family history of diabetes mellitus: Secondary | ICD-10-CM

## 2021-01-14 DIAGNOSIS — F1110 Opioid abuse, uncomplicated: Secondary | ICD-10-CM

## 2021-01-14 DIAGNOSIS — D509 Iron deficiency anemia, unspecified: Secondary | ICD-10-CM | POA: Diagnosis present

## 2021-01-14 DIAGNOSIS — Z9049 Acquired absence of other specified parts of digestive tract: Secondary | ICD-10-CM

## 2021-01-14 DIAGNOSIS — F17210 Nicotine dependence, cigarettes, uncomplicated: Secondary | ICD-10-CM | POA: Diagnosis present

## 2021-01-14 DIAGNOSIS — F1410 Cocaine abuse, uncomplicated: Secondary | ICD-10-CM | POA: Diagnosis present

## 2021-01-14 DIAGNOSIS — R109 Unspecified abdominal pain: Secondary | ICD-10-CM | POA: Diagnosis present

## 2021-01-14 DIAGNOSIS — E876 Hypokalemia: Secondary | ICD-10-CM

## 2021-01-14 DIAGNOSIS — Z20822 Contact with and (suspected) exposure to covid-19: Secondary | ICD-10-CM | POA: Diagnosis present

## 2021-01-14 DIAGNOSIS — Z5329 Procedure and treatment not carried out because of patient's decision for other reasons: Secondary | ICD-10-CM | POA: Diagnosis not present

## 2021-01-14 DIAGNOSIS — K253 Acute gastric ulcer without hemorrhage or perforation: Secondary | ICD-10-CM | POA: Diagnosis not present

## 2021-01-14 DIAGNOSIS — Z6820 Body mass index (BMI) 20.0-20.9, adult: Secondary | ICD-10-CM

## 2021-01-14 DIAGNOSIS — R64 Cachexia: Secondary | ICD-10-CM | POA: Diagnosis present

## 2021-01-14 DIAGNOSIS — K289 Gastrojejunal ulcer, unspecified as acute or chronic, without hemorrhage or perforation: Principal | ICD-10-CM | POA: Diagnosis present

## 2021-01-14 DIAGNOSIS — R1013 Epigastric pain: Secondary | ICD-10-CM

## 2021-01-14 DIAGNOSIS — Z86718 Personal history of other venous thrombosis and embolism: Secondary | ICD-10-CM

## 2021-01-14 DIAGNOSIS — K219 Gastro-esophageal reflux disease without esophagitis: Secondary | ICD-10-CM | POA: Diagnosis present

## 2021-01-14 DIAGNOSIS — D75838 Other thrombocytosis: Secondary | ICD-10-CM | POA: Diagnosis present

## 2021-01-14 DIAGNOSIS — M1990 Unspecified osteoarthritis, unspecified site: Secondary | ICD-10-CM | POA: Diagnosis present

## 2021-01-14 LAB — COMPREHENSIVE METABOLIC PANEL
ALT: 15 U/L (ref 0–44)
AST: 24 U/L (ref 15–41)
Albumin: 3 g/dL — ABNORMAL LOW (ref 3.5–5.0)
Alkaline Phosphatase: 121 U/L (ref 38–126)
Anion gap: 12 (ref 5–15)
BUN: 12 mg/dL (ref 6–20)
CO2: 24 mmol/L (ref 22–32)
Calcium: 8.6 mg/dL — ABNORMAL LOW (ref 8.9–10.3)
Chloride: 100 mmol/L (ref 98–111)
Creatinine, Ser: 0.72 mg/dL (ref 0.44–1.00)
GFR, Estimated: >60 mL/min (ref >60)
Glucose, Bld: 89 mg/dL (ref 70–99)
Potassium: 3.1 mmol/L — ABNORMAL LOW (ref 3.5–5.1)
Sodium: 136 mmol/L (ref 135–145)
Total Bilirubin: 0.7 mg/dL (ref 0.3–1.2)
Total Protein: 6.8 g/dL (ref 6.5–8.1)

## 2021-01-14 LAB — CBC WITH DIFFERENTIAL/PLATELET
Abs Immature Granulocytes: 0.02 10*3/uL (ref 0.00–0.07)
Basophils Absolute: 0.1 10*3/uL (ref 0.0–0.1)
Basophils Relative: 1 %
Eosinophils Absolute: 0.3 10*3/uL (ref 0.0–0.5)
Eosinophils Relative: 4 %
HCT: 37.6 % (ref 36.0–46.0)
Hemoglobin: 12.1 g/dL (ref 12.0–15.0)
Immature Granulocytes: 0 %
Lymphocytes Relative: 32 %
Lymphs Abs: 2.1 10*3/uL (ref 0.7–4.0)
MCH: 21 pg — ABNORMAL LOW (ref 26.0–34.0)
MCHC: 32.2 g/dL (ref 30.0–36.0)
MCV: 65.4 fL — ABNORMAL LOW (ref 80.0–100.0)
Monocytes Absolute: 0.4 10*3/uL (ref 0.1–1.0)
Monocytes Relative: 7 %
Neutro Abs: 3.7 10*3/uL (ref 1.7–7.7)
Neutrophils Relative %: 56 %
Platelets: 375 10*3/uL (ref 150–400)
RBC: 5.75 MIL/uL — ABNORMAL HIGH (ref 3.87–5.11)
RDW: 21.9 % — ABNORMAL HIGH (ref 11.5–15.5)
WBC: 6.6 10*3/uL (ref 4.0–10.5)
nRBC: 0 % (ref 0.0–0.2)

## 2021-01-14 LAB — MAGNESIUM: Magnesium: 2.1 mg/dL (ref 1.7–2.4)

## 2021-01-14 LAB — RAPID URINE DRUG SCREEN, HOSP PERFORMED
Amphetamines: POSITIVE — AB
Barbiturates: NOT DETECTED
Benzodiazepines: POSITIVE — AB
Cocaine: POSITIVE — AB
Opiates: POSITIVE — AB
Tetrahydrocannabinol: NOT DETECTED

## 2021-01-14 LAB — SARS CORONAVIRUS 2 (TAT 6-24 HRS): SARS Coronavirus 2: NEGATIVE

## 2021-01-14 LAB — RESP PANEL BY RT-PCR (FLU A&B, COVID) ARPGX2
Influenza A by PCR: NEGATIVE
Influenza B by PCR: NEGATIVE
SARS Coronavirus 2 by RT PCR: NEGATIVE

## 2021-01-14 MED ORDER — ONDANSETRON HCL 4 MG PO TABS: 4.0000 mg | ORAL_TABLET | Freq: Four times a day (QID) | ORAL | Status: DC | PRN

## 2021-01-14 MED ORDER — HYDROXYZINE HCL 25 MG PO TABS: 25.0000 mg | ORAL_TABLET | Freq: Four times a day (QID) | ORAL | Status: DC | PRN

## 2021-01-14 MED ORDER — POTASSIUM CHLORIDE 2 MEQ/ML IV SOLN
INTRAVENOUS | Status: DC
  Filled 2021-01-14 (×5): qty 1000

## 2021-01-14 MED ORDER — CLONIDINE HCL 0.1 MG PO TABS: 0.1000 mg | ORAL_TABLET | ORAL | Status: DC

## 2021-01-14 MED ORDER — HYDROMORPHONE HCL 1 MG/ML IJ SOLN
1.0000 mg | Freq: Once | INTRAMUSCULAR | Status: AC
  Administered 2021-01-14: 1 mg via INTRAVENOUS
  Filled 2021-01-14: qty 1

## 2021-01-14 MED ORDER — DICYCLOMINE HCL 20 MG PO TABS
20.0000 mg | ORAL_TABLET | Freq: Four times a day (QID) | ORAL | Status: DC | PRN
  Filled 2021-01-14: qty 1

## 2021-01-14 MED ORDER — PANTOPRAZOLE SODIUM 40 MG IV SOLR
40.0000 mg | Freq: Two times a day (BID) | INTRAVENOUS | Status: DC
  Administered 2021-01-14 – 2021-01-15 (×2): 40 mg via INTRAVENOUS
  Filled 2021-01-14 (×3): qty 40

## 2021-01-14 MED ORDER — CLONIDINE HCL 0.1 MG PO TABS: 0.1000 mg | ORAL_TABLET | Freq: Every day | ORAL | Status: DC

## 2021-01-14 MED ORDER — ONDANSETRON HCL 4 MG/2ML IJ SOLN: 4.0000 mg | Freq: Four times a day (QID) | INTRAMUSCULAR | Status: DC | PRN

## 2021-01-14 MED ORDER — HYDROMORPHONE HCL 1 MG/ML IJ SOLN
1.0000 mg | INTRAMUSCULAR | Status: DC | PRN
Start: 2021-01-14 — End: 2021-01-16
  Administered 2021-01-14 – 2021-01-16 (×9): 1 mg via INTRAVENOUS
  Filled 2021-01-14 (×9): qty 1

## 2021-01-14 MED ORDER — LORAZEPAM 2 MG/ML IJ SOLN
1.0000 mg | INTRAMUSCULAR | Status: DC | PRN
Start: 2021-01-14 — End: 2021-01-14

## 2021-01-14 MED ORDER — SUCRALFATE 1 GM/10ML PO SUSP
1.0000 g | Freq: Once | ORAL | Status: AC
  Administered 2021-01-14: 1 g via ORAL
  Filled 2021-01-14: qty 10

## 2021-01-14 MED ORDER — FAMOTIDINE IN NACL 20-0.9 MG/50ML-% IV SOLN
20.0000 mg | Freq: Once | INTRAVENOUS | Status: AC
  Administered 2021-01-14: 20 mg via INTRAVENOUS
  Filled 2021-01-14: qty 50

## 2021-01-14 MED ORDER — SUCRALFATE 1 G PO TABS
1.0000 g | ORAL_TABLET | Freq: Three times a day (TID) | ORAL | Status: DC
  Administered 2021-01-14 – 2021-01-15 (×3): 1 g via ORAL
  Filled 2021-01-14 (×4): qty 1

## 2021-01-14 MED ORDER — ACETAMINOPHEN 650 MG RE SUPP: 650.0000 mg | Freq: Four times a day (QID) | RECTAL | Status: DC | PRN

## 2021-01-14 MED ORDER — ONDANSETRON 4 MG PO TBDP: 4.0000 mg | ORAL_TABLET | Freq: Four times a day (QID) | ORAL | Status: DC | PRN

## 2021-01-14 MED ORDER — LOPERAMIDE HCL 2 MG PO CAPS: 2.0000 mg | ORAL_CAPSULE | ORAL | Status: DC | PRN

## 2021-01-14 MED ORDER — HYDROMORPHONE HCL 1 MG/ML IJ SOLN
1.0000 mg | INTRAMUSCULAR | Status: DC | PRN
  Filled 2021-01-14: qty 1

## 2021-01-14 MED ORDER — CLONIDINE HCL 0.1 MG PO TABS
0.1000 mg | ORAL_TABLET | Freq: Four times a day (QID) | ORAL | Status: DC
  Administered 2021-01-14: 0.1 mg via ORAL
  Filled 2021-01-14: qty 1

## 2021-01-14 MED ORDER — ONDANSETRON HCL 4 MG/2ML IJ SOLN
4.0000 mg | Freq: Once | INTRAMUSCULAR | Status: AC
  Administered 2021-01-14: 4 mg via INTRAVENOUS
  Filled 2021-01-14: qty 2

## 2021-01-14 MED ORDER — HYDRALAZINE HCL 20 MG/ML IJ SOLN
10.0000 mg | INTRAMUSCULAR | Status: AC | PRN
  Administered 2021-01-14 (×2): 10 mg via INTRAVENOUS
  Filled 2021-01-14 (×2): qty 1

## 2021-01-14 MED ORDER — ACETAMINOPHEN 325 MG PO TABS
650.0000 mg | ORAL_TABLET | Freq: Four times a day (QID) | ORAL | Status: DC | PRN
  Administered 2021-01-15 – 2021-01-16 (×2): 650 mg via ORAL
  Filled 2021-01-14 (×2): qty 2

## 2021-01-14 MED ORDER — HYDRALAZINE HCL 20 MG/ML IJ SOLN
10.0000 mg | Freq: Three times a day (TID) | INTRAMUSCULAR | Status: DC
  Administered 2021-01-14: 10 mg via INTRAVENOUS
  Filled 2021-01-14: qty 1

## 2021-01-14 NOTE — ED Provider Notes (Signed)
Emergency Medicine Provider Triage Evaluation Note  Terri [name] , a 45 y.o. female  was evaluated in triage.  Pt complains of AP x 1 week. Admitted yesterday, left AMA because "they wouldn't treat my pain". Same symptoms, still having pain.   Review of Systems  Positive: AP Negative: Dysuria   Physical Exam  There were no vitals taken for this visit. Gen:   Awake, no distress. Chronically ill appearing, malnourished  Resp:  Normal effort  MSK:   Moves extremities without difficulty  Other:  Epigastric TTP  Medical Decision Making  Medically screening exam initiated at 12:39 PM.  Appropriate orders placed.  Terri [name] was informed that the remainder of the evaluation will be completed by another provider, this initial triage assessment does not replace that evaluation, and the importance of remaining in the ED until their evaluation is complete.  Will repeat basic labs and covid test and plan for her to admitted again.    Terri Raring, PA-C 01/14/21 1241    Terri Lemming, MD 01/14/21 1311

## 2021-01-14 NOTE — Progress Notes (Signed)
Notified by phlebotomy that patient was refusing laboratory sticks. Upon entering patients room, patient was asleep. Patient in and out of sleep state. Asked patient if we could obtain lab sticks. "Patient stated no and wanted to leave hospital." Patient stating "only want pain medications". Notified MD Rai in regards to wanting to leave AMA and just wanting pain medication but refusing antibiotics and other medications. Patient is drowsy when speaking. Patient receiving '1mg'$  of diluadid every 2hrs. MD Rai increasing dose to '1mg'$ -'2mg'$ . Patient currently asleep at this time again. Will pass on to bedside RN, Rod Holler, in regards to order changes.

## 2021-01-14 NOTE — Progress Notes (Signed)
Pt agitated stated "I just want my pain medication and then I will leave". I explain to pt that I can't give her pain medication if she is going to leave. Pt very drowsy sleeps as she talks. She was addiment about leaving. Wore her clothes made [place] take out her IV and began walking to the exit. Refused to sign AMA form, which was signed by 2 RNs. Agricultural consultant, me and another RN supervised pt as she walked out. Pt was tired sat in the lobby, MD came to talk to the pt in the lobby. AC was notified and came to the lobby as well. Pt notified friend to pick her up. Friend refused to come multiple times before agreeing, after talking with the [facility]. Pt walked outside and sat. Awaiting friend. Explained to pt the consequences of her leaving. Asked her multiple times even as she is walking outside to come back so that we can treat her. Pt refused. "States she just wants her cigarette and her stuff she will come back." We offered to get her stuff, pt still refused. Charge and [facility] waited with pt until her ride arrived.

## 2021-01-14 NOTE — Progress Notes (Signed)
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  Patient however left AGAINST MEDICAL ADVICE early this morning only to return with worsening abdominal pain this afternoon demanding narcotics for her pain.  Patient is unable to give me much history except for severe epigastric pain that has worsened since this morning prompting her to come back to the emergency room.  She denies having any melena hematochezia.  There is no history of coffee-ground emesis Hemant hematemesis.  Objective: Vital signs in last 24 hours: Temp:  [97.1 F (36.2 C)-98.5 F (36.9 C)] 98.1 F (36.7 C) (08/24 1313) Pulse Rate:  [74-114] 79 (08/24 1700) Resp:  [16-31] 17 (08/24 1700) BP: (118-171)/(86-124) 126/96 (08/24 1700) SpO2:  [94 %-100 %] 97 % (08/24 1700)    Intake/Output from previous day: No intake/output data recorded. Intake/Output this shift: No intake/output data recorded.  General appearance: alert, cooperative, appears older than stated age, cachectic, fatigued, severe distress, and pale Resp: clear to auscultation bilaterally Cardio: regular rate and rhythm, S1, S2 normal, no murmur, click, rub or gallop GI: soft, diffusely tender on palpation with hypoactive bowel sounds  no masses,  no organomegaly Extremities: extremities normal, atraumatic, no cyanosis or edema  Lab Results: Recent Labs    01/13/21 0349 01/14/21 1254  WBC 7.6 6.6  HGB 10.6* 12.1  HCT 32.5* 37.6  PLT 343 375   BMET Recent Labs    01/13/21 0349 01/14/21 1254  NA 133* 136  K 4.0 3.1*  CL 97* 100  CO2 29 24  GLUCOSE 95 89  BUN 17 12  CREATININE 0.75 0.72  CALCIUM 8.8* 8.6*   LFT Recent Labs     01/14/21 1254  PROT 6.8  ALBUMIN 3.0*  AST 24  ALT 15  ALKPHOS 121  BILITOT 0.7   Studies/Results: CT ABDOMEN PELVIS W CONTRAST  Result Date: 01/13/2021 CLINICAL DATA:  45 year old female with history of abdominal pain shooting up into her chest for 1 week. EXAM: CT ABDOMEN AND PELVIS WITH CONTRAST TECHNIQUE: Multidetector CT imaging of the abdomen and pelvis was performed using the standard protocol following bolus administration of intravenous contrast. CONTRAST:  83m OMNIPAQUE IOHEXOL 350 MG/ML SOLN COMPARISON:  CT the abdomen and pelvis 02/08/2019. FINDINGS: Lower chest: Scattered areas of mild cylindrical bronchiectasis with some associated peribronchovascular ground-glass attenuation micro nodularity and additional small pulmonary nodules scattered throughout the visualized lung bases, largest of which measures 5 mm in the left lower lobe (axial image 19 of series 6). Areas of scarring and/or subsegmental atelectasis in the lower lobes of the lungs bilaterally. Atherosclerotic calcifications in the thoracic aorta. Hepatobiliary: In the periphery of the right lobe of the liver between segments 5 and 8 (axial image 21 of series 2) there is a small subcapsular lesion measuring 1.2 x 0.5 cm, too small to characterize, but similar to the prior study and statistically likely to represent a cyst. No other larger more suspicious appearing hepatic lesions are noted. Status post cholecystectomy. Minimal intrahepatic biliary ductal dilatation and slight prominence of the common bile duct which measures 1 cm in the porta hepatis likely reflects benign post cholecystectomy physiology. Pancreas: No pancreatic mass. No pancreatic ductal dilatation. No pancreatic or peripancreatic fluid collections or inflammatory  changes. Spleen: Spleen is mildly enlarged measuring 10.5 x 4.9 x 14.7 cm (estimated splenic volume of 378 mL). Adrenals/Urinary Tract: Extensive cortical thinning is noted in the left kidney. Right  kidney and bilateral adrenal glands are normal in appearance. No hydroureteronephrosis. Small amount of gas non dependently in the lumen of the urinary bladder. Urinary bladder is otherwise normal in appearance. Stomach/Bowel: Postoperative changes of Roux-en-Y gastric bypass. Adjacent to the anastomosis there is a round structure in the left upper quadrant of the abdomen (axial image 25 of series 2) which has a very thickened hazy irregular wall, concerning for an ulcer. It is uncertain whether this arises directly from the anastomotic suture line or is within the adjacent portions of either the small bowel or stomach. There is a large amount of surrounding inflammation, very intimately associated with the proximal small bowel, well appreciated anteriorly on axial image 28 of series 2. No well-defined fluid collection is identified in this region. No pathologic dilatation of small bowel or colon. Appendix is not confidently identified may be surgically absent or atrophic. Vascular/Lymphatic: Aortic atherosclerosis, without evidence of aneurysm or dissection in the abdominal or pelvic vasculature. No lymphadenopathy noted in the abdomen or pelvis. Reproductive: Uterus and ovaries are a trophic. Other: Mild diffuse mesenteric edema. Small volume of ascites. No pneumoperitoneum. Musculoskeletal: Mild diffuse body wall edema. Status post laminectomy at L4-L5. Multilevel degenerative disc disease, most severe at L3-L4 where there are advanced discogenic endplate changes. There are no aggressive appearing lytic or blastic lesions noted in the visualized portions of the skeleton. IMPRESSION: 1. Findings are highly concerning for large perianastomotic ulcer in this patient who is status post Roux-en-Y gastric bypass, as detailed above. 2. Anasarca. 3. Compared to the prior examination there is extensive thinning of the cortex of the left kidney, presumably scarring from prior obstruction/infection. 4. Small amount of gas  in the nondependent portion of the lumen of the urinary bladder on today's examination. If there has been recent catheterization for urinalysis, this is presumably iatrogenic. In the absence of a recent history of catheterization, further evaluation with urinalysis would be recommended to exclude the possibility of urinary tract infection with gas-forming organisms. 5. Findings in the lung bases which could be seen in the setting of chronic indolent atypical infectious process such as MAI (mycobacterium avium intracellulare). Nonemergent outpatient referral to Pulmonology for further clinical evaluation is suggested. 6. Mild splenomegaly. 7. Additional incidental findings, as above. Electronically Signed   By: Vinnie Langton M.D.   On: 01/13/2021 12:12    Medications: I have reviewed the patient's current medications. Prior to Admission: (Not in a hospital admission)  Scheduled:  pantoprazole (PROTONIX) IV  40 mg Intravenous Q12H   sucralfate  1 g Oral TID WC & HS   Continuous:  lactated ringers with kcl     [id number] **OR** acetaminophen, HYDROmorphone (DILAUDID) injection, ondansetron **OR** ondansetron (ZOFRAN) IV  Assessment/Plan: 1) Severe epigastric pain due to large anastomotic ulcer status post gastric Roux-en-Y-patient will need EGD once her blood pressure is under control. We will allow patient clear liquids for now; patient should be on a Protonix drip.  2) Severe malnutrition/cachexia. 3) Uncontrolled hypertension. 4) History of IVDA.  LOS: 0 days   Juanita Craver 01/14/2021, 7:54 PM

## 2021-01-14 NOTE — Discharge Summary (Signed)
AMA NOTE    Patient ID: Terri [name] MRN: [id number] DOB/AGE: 1976/03/26 45 y.o.  Admit date: 01/13/2021 Discharge date: 01/14/2021   PLEASE NOTE THAT PATIENT LEFT AGAINST MEDICAL ADVICE. Risks of perforation of the gastric ulcer, sepsis, death were explained in detail to the patient and she verbally understood the risks of leaving AMA.   Primary Care Physician:  Patient, No Pcp Per (Inactive)  Discharge Diagnoses:     Ulcer at site of surgical anastomosis following bypass of stomach  Amphetamine abuse ([place])  Cocaine abuse ([place])  Heroin abuse ([place])  UTI (urinary tract infection)  Gastric ulcer  Anxiety  Hypertensive urgency  Tobacco abuse  SIRS (systemic inflammatory response syndrome) ([place]) Medical noncompliance  Consults: Gastroenterology, Dr. Benson Norway   Recommendations for Outpatient Follow-up:  Patient left AMA  TESTS THAT NEED FOLLOW-UP Patient left AMA   DIET: N.p.o. during admission, EGD was planned for 8/25 if BP improving    Allergies:   Allergies  Allergen Reactions   Ketorolac Tromethamine Itching and Nausea And Vomiting   Methocarbamol Diarrhea     Discharge Medications: Please note that patient left AMA (against medical advice)   Brief H and P: For complete details please refer to admission H and P, but in brief Patient is a 45 year old female with history of hypertension, GERD with known gastric ulcer, bipolar disorder, chronic pain, gastric bypass, IV drug use, snorted heroin, cocaine most recently 2 days prior to admission presented with nausea, epigastric abdominal pain for the last 1 week.  Patient reported no aspirin or NSAID use.  Patient was on antihypertensives however has been off of them due to losing her insurance.  No hematochezia or melena.  History of gastric sleeve in 2012.  FOBT negative. CT abdomen showed large perianastomotic ulcer in the area status post Roux-en-Y gastric bypass, anasarca, splenomegaly. Also reported foul odor in  the urine, UA positive for UTI  Hospital Course:   Abdominal pain secondary to gastric ulcer at the site of surgical anastomosis, -Patient was seen and examined earlier this morning prior to him leaving AMA.   -She was maintained on n.p.o. status, IV fluids, IV PPI twice daily, antiemetics  -GI was following, seen by Dr. Benson Norway, recommended endoscopy on 8/25 if BP improves      UTI (urinary tract infection) -Patient was placed on IV Rocephin while awaiting culture and sensitivities   History of substance abuse, cocaine, heroin -Last noted 2 days prior to admission now complaining of abdominal pain, nausea, BP uncontrolled, likely going into withdrawal -Patient was placed on clonidine withdrawal protocol, IV Dilaudid was increased     Hypertensive urgency -Patient has a history of hypertension however was not taking medications -Placed on clonidine protocol, continue IV hydralazine scheduled, IV Vasotec scheduled.  Patient was refusing p.o. meds.     Tobacco abuse -Patient was placed on nicotine patch   -Counseled strongly on smoking cessation   History of anxiety, bipolar ds -Alert and oriented x4 on my examination however continue to demand leaving AMA intermittently overnight  -I explained that she would be leaving AMA and has risk of gastric ulcer perforation, sepsis and death.  Patient walked out of the room and went to the central lobby.  Patient verbalized understanding all the risks and called her significant other to come and pick her up.  Patient refused to listen to team members, [facility] was also called.      Day of Discharge: Patient left AMA BP (!) 159/114   Pulse Marland Kitchen)  102   Temp (!) 97.1 F (36.2 C) (Axillary)   Resp (!) 31   Ht '5\' 9"'$  (1.753 m)   Wt 63.5 kg   SpO2 97%   BMI 20.67 kg/m   Physical Exam: General: Alert and awake oriented x3 not in any acute distress. CVS: S1-S2 clear no murmur rubs or gallops Chest: clear to auscultation bilaterally, no wheezing rales  or rhonchi Abdomen: soft diffuse TTP nondistended, normal bowel sounds Extremities: no cyanosis, clubbing or edema noted bilaterally Neuro: ambulating on her own   The results of significant diagnostics from this hospitalization (including imaging, microbiology, ancillary and laboratory) are listed below for reference.    LAB RESULTS: Basic Metabolic Panel: Recent Labs  Lab 01/13/21 0349  NA 133*  K 4.0  CL 97*  CO2 29  GLUCOSE 95  BUN 17  CREATININE 0.75  CALCIUM 8.8*   Liver Function Tests: Recent Labs  Lab 01/13/21 0349  AST 22  ALT 15  ALKPHOS 118  BILITOT 0.4  PROT 6.5  ALBUMIN 2.8*   Recent Labs  Lab 01/13/21 0349  LIPASE 23   No results for input(s): AMMONIA in the last 168 hours. CBC: Recent Labs  Lab 01/13/21 0349  WBC 7.6  NEUTROABS 4.7  HGB 10.6*  HCT 32.5*  MCV 65.1*  PLT 343   Cardiac Enzymes: No results for input(s): CKTOTAL, CKMB, CKMBINDEX, TROPONINI in the last 168 hours. BNP: Invalid input(s): POCBNP CBG: No results for input(s): GLUCAP in the last 168 hours.  Significant Diagnostic Studies:  CT ABDOMEN PELVIS W CONTRAST  Result Date: 01/13/2021 CLINICAL DATA:  45 year old female with history of abdominal pain shooting up into her chest for 1 week. EXAM: CT ABDOMEN AND PELVIS WITH CONTRAST TECHNIQUE: Multidetector CT imaging of the abdomen and pelvis was performed using the standard protocol following bolus administration of intravenous contrast. CONTRAST:  86m OMNIPAQUE IOHEXOL 350 MG/ML SOLN COMPARISON:  CT the abdomen and pelvis 02/08/2019. FINDINGS: Lower chest: Scattered areas of mild cylindrical bronchiectasis with some associated peribronchovascular ground-glass attenuation micro nodularity and additional small pulmonary nodules scattered throughout the visualized lung bases, largest of which measures 5 mm in the left lower lobe (axial image 19 of series 6). Areas of scarring and/or subsegmental atelectasis in the lower lobes of  the lungs bilaterally. Atherosclerotic calcifications in the thoracic aorta. Hepatobiliary: In the periphery of the right lobe of the liver between segments 5 and 8 (axial image 21 of series 2) there is a small subcapsular lesion measuring 1.2 x 0.5 cm, too small to characterize, but similar to the prior study and statistically likely to represent a cyst. No other larger more suspicious appearing hepatic lesions are noted. Status post cholecystectomy. Minimal intrahepatic biliary ductal dilatation and slight prominence of the common bile duct which measures 1 cm in the porta hepatis likely reflects benign post cholecystectomy physiology. Pancreas: No pancreatic mass. No pancreatic ductal dilatation. No pancreatic or peripancreatic fluid collections or inflammatory changes. Spleen: Spleen is mildly enlarged measuring 10.5 x 4.9 x 14.7 cm (estimated splenic volume of 378 mL). Adrenals/Urinary Tract: Extensive cortical thinning is noted in the left kidney. Right kidney and bilateral adrenal glands are normal in appearance. No hydroureteronephrosis. Small amount of gas non dependently in the lumen of the urinary bladder. Urinary bladder is otherwise normal in appearance. Stomach/Bowel: Postoperative changes of Roux-en-Y gastric bypass. Adjacent to the anastomosis there is a round structure in the left upper quadrant of the abdomen (axial image 25 of series 2) which  has a very thickened hazy irregular wall, concerning for an ulcer. It is uncertain whether this arises directly from the anastomotic suture line or is within the adjacent portions of either the small bowel or stomach. There is a large amount of surrounding inflammation, very intimately associated with the proximal small bowel, well appreciated anteriorly on axial image 28 of series 2. No well-defined fluid collection is identified in this region. No pathologic dilatation of small bowel or colon. Appendix is not confidently identified may be surgically absent  or atrophic. Vascular/Lymphatic: Aortic atherosclerosis, without evidence of aneurysm or dissection in the abdominal or pelvic vasculature. No lymphadenopathy noted in the abdomen or pelvis. Reproductive: Uterus and ovaries are a trophic. Other: Mild diffuse mesenteric edema. Small volume of ascites. No pneumoperitoneum. Musculoskeletal: Mild diffuse body wall edema. Status post laminectomy at L4-L5. Multilevel degenerative disc disease, most severe at L3-L4 where there are advanced discogenic endplate changes. There are no aggressive appearing lytic or blastic lesions noted in the visualized portions of the skeleton. IMPRESSION: 1. Findings are highly concerning for large perianastomotic ulcer in this patient who is status post Roux-en-Y gastric bypass, as detailed above. 2. Anasarca. 3. Compared to the prior examination there is extensive thinning of the cortex of the left kidney, presumably scarring from prior obstruction/infection. 4. Small amount of gas in the nondependent portion of the lumen of the urinary bladder on today's examination. If there has been recent catheterization for urinalysis, this is presumably iatrogenic. In the absence of a recent history of catheterization, further evaluation with urinalysis would be recommended to exclude the possibility of urinary tract infection with gas-forming organisms. 5. Findings in the lung bases which could be seen in the setting of chronic indolent atypical infectious process such as MAI (mycobacterium avium intracellulare). Nonemergent outpatient referral to Pulmonology for further clinical evaluation is suggested. 6. Mild splenomegaly. 7. Additional incidental findings, as above. Electronically Signed   By: Vinnie Langton M.D.   On: 01/13/2021 12:12    2D ECHO:   Disposition and Follow-up:    DISPOSITION: Patient left AMA. She was advised to seek follow-up with primary care physician.     DISCHARGE FOLLOW-UP    Time spent  25  minutes  Signed:   Estill Cotta M.D. [facility] 01/14/2021, 11:38 AM

## 2021-01-14 NOTE — ED Provider Notes (Signed)
Interior DEPT Provider Note   CSN: [id number] Arrival date & time: 01/14/21  1150     History Chief Complaint  Patient presents with   Abdominal Pain    ROXETTE WULFEKUHLE is a 45 y.o. female.  HPI Patient presents several hours after leaving [facility] following admission yesterday due to abdominal pain during which she was found to have likely ulcer of anastomotic region gastric bypass. She notes that after leaving she did not make it far from the hospital before she realized her pain was too severe for her to function at home and try to have additional care as an outpatient.  She did not eat anything, take anything, have any trauma, develop any new complaints, but now returns for ongoing severe sustained upper abdominal pain.  Pain is gotten worse since departure, as she has had no medication.  History is obtained by the patient, chart review, with notable details from hospital course, CT, below: Assessment/Plan: 1) Anastomotic ulcer. 2) Epigastric pain. 3) Cocaine abuse. 4) Severe HTN.               The patient will need an EGD, but her HTN needs to be controlled.  In the meantime she needs to be treated with medication for her anastomotic ulcer.  It is visible during this CT scan.  It may be that she will require surgical revision.   Plan: 1) PPI BID. 2) Sucralfate. 3) Control HTN. 4) EGD Thursday or Friday pending her blood pressure control.  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  Amphetamine abuse ([place])  Cocaine abuse ([place])  Heroin abuse ([place])  UTI (urinary tract infection)  Gastric ulcer  Anxiety  Hypertensive urgency  Tobacco abuse  SIRS (systemic inflammatory response syndrome) (HCC)     Past Medical History:  Diagnosis Date   Arthritis    Back and legs   Bipolar disorder (HCC)    Chronic back pain    Depression    DVT (deep venous  thrombosis) ([place])    early 20's leg   History of blood transfusion    Hypertension    Migraine headache    Neuropathy    Pneumonia    Positive PPD    x 2 last time 09/2017   Sciatica    Stress incontinence     Patient Active Problem List   Diagnosis Date Noted   Abdominal pain 01/14/2021   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   Persistent fever 02/09/2019   COVID-19 virus not detected    Chest pain    Bacteremia due to Escherichia coli    Esophagitis    Hypokalemia    Hypomagnesemia    Sepsis ([place]) 02/02/2019   Leucocytosis 02/02/2019   Thrombocytosis 02/02/2019   UTI (urinary tract infection) 02/02/2019   GIB (gastrointestinal bleeding) 09/13/2017   Apnea 09/13/2017   Essential hypertension    Cellulitis and abscess of neck 08/12/2017   Cellulitis of submandibular region 08/10/2017  Odontogenic infection of jaw 08/10/2017   Cholecystitis 02/14/2017   Opioid abuse with opioid-induced mood disorder ([place]) 01/25/2017   Anxiety 02/11/2014   Depression 07/12/2012    Past Surgical History:  Procedure Laterality Date   ALVEOLOPLASTY Bilateral 01/31/2018   Procedure: ALVEOLOPLASTY;  Surgeon: Diona Browner, DDS;  Location: [facility];  Service: Oral Surgery;  Laterality: Bilateral;   ANKLE SURGERY Left 1992   with hardware   BACK SURGERY     BIOPSY  02/08/2019   Procedure: BIOPSY;  Surgeon: Rush Landmark Telford Nab., MD;  Location: [facility];  Service: Gastroenterology;;   CESAREAN SECTION     CHOLECYSTECTOMY N/A 02/15/2017    Procedure: LAPAROSCOPIC CHOLECYSTECTOMY;  Surgeon: Clovis Riley, MD;  Location: [facility];  Service: General;  Laterality: N/A;   ENTEROSCOPY N/A 02/08/2019   Procedure: ENTEROSCOPY;  Surgeon: Rush Landmark Telford Nab., MD;  Location: [facility];  Service: Gastroenterology;  Laterality: N/A;   ESOPHAGOGASTRODUODENOSCOPY (EGD) WITH PROPOFOL N/A 09/15/2017   Procedure: ESOPHAGOGASTRODUODENOSCOPY (EGD) WITH PROPOFOL;  Surgeon: Clarene Essex, MD;  [facility];  Service: Endoscopy;  Laterality: N/A;   ESOPHAGOGASTRODUODENOSCOPY (EGD) WITH PROPOFOL N/A 02/08/2019   Procedure: ESOPHAGOGASTRODUODENOSCOPY (EGD) WITH PROPOFOL;  Surgeon: Rush Landmark Telford Nab., MD;  [facility];  Service: Gastroenterology;  Laterality: N/A;   GASTRIC BYPASS     INCISION AND DRAINAGE OF PERITONSILLAR ABCESS Left 08/13/2017   Procedure: INCISION AND DRAINAGE OF LEFT NECK ABSCESS;  Surgeon: Melida Quitter, MD;  Location: [facility];  Service: ENT;  Laterality: Left;   LAPAROSCOPIC LYSIS OF ADHESIONS  02/15/2017   Procedure: LAPAROSCOPIC LYSIS OF ADHESIONS;  Surgeon: Clovis Riley, MD;  Location: [facility];  Service: General;;   LUMBAR DISC SURGERY     LUMBAR FUSION     SAVORY DILATION N/A 02/08/2019   Procedure: SAVORY DILATION;  Surgeon: Irving Copas., MD;  [facility];  Service: Gastroenterology;  Laterality: N/A;   TOOTH EXTRACTION Bilateral 01/31/2018   Procedure: DENTAL RESTORATION/EXTRACTIONS;  Surgeon: Diona Browner, DDS;  Location: [facility];  Service: Oral Surgery;  Laterality: Bilateral;   TUBAL LIGATION     UPPER GI ENDOSCOPY N/A 02/15/2017   Procedure: UPPER GI ENDOSCOPY;  Surgeon: Clovis Riley, MD;  [facility];  Service: General;  Laterality: N/A;     OB History   No obstetric history on file.     Family History  Problem Relation Age of Onset   Diabetes Mother    Hypertension Mother    Diabetes Father    Hypertension Father     Social History   Tobacco  Use   Smoking status: Every Day    Packs/day: 1.00    Years: 29.00    Pack years: 29.00    Types: Cigarettes   Smokeless tobacco: Never  Vaping Use   Vaping Use: Never used  Substance Use Topics   Alcohol use: No   Drug use: Yes    Types: IV, Cocaine    Home Medications Prior to Admission medications   Medication Sig Start Date End Date Taking? Authorizing Provider  acetaminophen (TYLENOL) 500 MG tablet Take 1 tablet (500 mg total) by mouth every 6 (six) hours as needed for moderate pain. Patient taking differently: Take 1,500 mg by mouth every 6 (six) hours as needed for moderate pain or mild pain. 01/31/18   Diona Browner, DMD  ibuprofen (ADVIL) 200 MG tablet Take 800 mg by mouth every 6 (six) hours as needed for moderate pain.    [provider]    Allergies    Ketorolac tromethamine and Methocarbamol  Review of Systems   Review of Systems  Constitutional:        Per HPI, otherwise negative  HENT:         Per HPI, otherwise negative  Respiratory:         Per HPI, otherwise negative  Cardiovascular:        Per HPI, otherwise negative  Gastrointestinal:  Negative for vomiting.  Endocrine:       Negative aside from HPI  Genitourinary:        Neg aside from HPI   Musculoskeletal:        Per HPI, otherwise negative  Skin: Negative.   Neurological:  Negative for syncope.   Physical Exam Updated Vital Signs BP (!) 126/96   Pulse 79   Temp 98.1 F (36.7 C) (Oral)   Resp 17   SpO2 97%   Physical Exam Vitals and nursing note reviewed.  Constitutional:      General: She is not in acute distress.    Appearance: She is well-developed.     Comments: Uncomfortable appearing thin adult female awake and alert  HENT:     Head: Normocephalic and atraumatic.  Eyes:     Conjunctiva/sclera: Conjunctivae normal.  Cardiovascular:     Rate and Rhythm: Normal rate and regular rhythm.  Pulmonary:     Effort: Pulmonary effort is normal. No respiratory distress.      Breath sounds: Normal breath sounds. No stridor.  Abdominal:     General: There is no distension.     Tenderness: There is abdominal tenderness in the epigastric area. There is guarding.  Skin:    General: Skin is warm and dry.  Neurological:     Mental Status: She is alert and oriented to person, place, and time.     Cranial Nerves: No cranial nerve deficit.    ED Results / Procedures / Treatments   Labs (all labs ordered are listed, but only abnormal results are displayed) Labs Reviewed  COMPREHENSIVE METABOLIC PANEL - Abnormal; Notable for the following components:      Result Value   Potassium 3.1 (*)    Calcium 8.6 (*)    Albumin 3.0 (*)    All other components within normal limits  CBC WITH DIFFERENTIAL/PLATELET - Abnormal; Notable for the following components:   RBC 5.75 (*)    MCV 65.4 (*)    MCH 21.0 (*)    RDW 21.9 (*)    All other components within normal limits  RESP PANEL BY RT-PCR (FLU A&B, COVID) ARPGX2  RAPID URINE DRUG SCREEN, HOSP PERFORMED  COMPREHENSIVE METABOLIC PANEL  CBC    EKG None  Radiology CT ABDOMEN PELVIS W CONTRAST  Result Date: 01/13/2021 CLINICAL DATA:  45 year old female with history of abdominal pain shooting up into her chest for 1 week. EXAM: CT ABDOMEN AND PELVIS WITH CONTRAST TECHNIQUE: Multidetector CT imaging of the abdomen and pelvis was performed using the standard protocol following bolus administration of intravenous contrast. CONTRAST:  68m OMNIPAQUE IOHEXOL 350 MG/ML SOLN COMPARISON:  CT the abdomen and pelvis 02/08/2019. FINDINGS: Lower chest: Scattered areas of mild cylindrical bronchiectasis with some associated peribronchovascular ground-glass attenuation micro nodularity and additional small pulmonary nodules scattered throughout the visualized lung bases, largest of which measures 5 mm in the left lower lobe (axial image 19 of series 6). Areas of scarring and/or subsegmental atelectasis in the lower lobes of the lungs  bilaterally.  Atherosclerotic calcifications in the thoracic aorta. Hepatobiliary: In the periphery of the right lobe of the liver between segments 5 and 8 (axial image 21 of series 2) there is a small subcapsular lesion measuring 1.2 x 0.5 cm, too small to characterize, but similar to the prior study and statistically likely to represent a cyst. No other larger more suspicious appearing hepatic lesions are noted. Status post cholecystectomy. Minimal intrahepatic biliary ductal dilatation and slight prominence of the common bile duct which measures 1 cm in the porta hepatis likely reflects benign post cholecystectomy physiology. Pancreas: No pancreatic mass. No pancreatic ductal dilatation. No pancreatic or peripancreatic fluid collections or inflammatory changes. Spleen: Spleen is mildly enlarged measuring 10.5 x 4.9 x 14.7 cm (estimated splenic volume of 378 mL). Adrenals/Urinary Tract: Extensive cortical thinning is noted in the left kidney. Right kidney and bilateral adrenal glands are normal in appearance. No hydroureteronephrosis. Small amount of gas non dependently in the lumen of the urinary bladder. Urinary bladder is otherwise normal in appearance. Stomach/Bowel: Postoperative changes of Roux-en-Y gastric bypass. Adjacent to the anastomosis there is a round structure in the left upper quadrant of the abdomen (axial image 25 of series 2) which has a very thickened hazy irregular wall, concerning for an ulcer. It is uncertain whether this arises directly from the anastomotic suture line or is within the adjacent portions of either the small bowel or stomach. There is a large amount of surrounding inflammation, very intimately associated with the proximal small bowel, well appreciated anteriorly on axial image 28 of series 2. No well-defined fluid collection is identified in this region. No pathologic dilatation of small bowel or colon. Appendix is not confidently identified may be surgically absent or  atrophic. Vascular/Lymphatic: Aortic atherosclerosis, without evidence of aneurysm or dissection in the abdominal or pelvic vasculature. No lymphadenopathy noted in the abdomen or pelvis. Reproductive: Uterus and ovaries are a trophic. Other: Mild diffuse mesenteric edema. Small volume of ascites. No pneumoperitoneum. Musculoskeletal: Mild diffuse body wall edema. Status post laminectomy at L4-L5. Multilevel degenerative disc disease, most severe at L3-L4 where there are advanced discogenic endplate changes. There are no aggressive appearing lytic or blastic lesions noted in the visualized portions of the skeleton. IMPRESSION: 1. Findings are highly concerning for large perianastomotic ulcer in this patient who is status post Roux-en-Y gastric bypass, as detailed above. 2. Anasarca. 3. Compared to the prior examination there is extensive thinning of the cortex of the left kidney, presumably scarring from prior obstruction/infection. 4. Small amount of gas in the nondependent portion of the lumen of the urinary bladder on today's examination. If there has been recent catheterization for urinalysis, this is presumably iatrogenic. In the absence of a recent history of catheterization, further evaluation with urinalysis would be recommended to exclude the possibility of urinary tract infection with gas-forming organisms. 5. Findings in the lung bases which could be seen in the setting of chronic indolent atypical infectious process such as MAI (mycobacterium avium intracellulare). Nonemergent outpatient referral to Pulmonology for further clinical evaluation is suggested. 6. Mild splenomegaly. 7. Additional incidental findings, as above. Electronically Signed   By: Vinnie Langton M.D.   On: 01/13/2021 12:12    Procedures Procedures   Medications Ordered in ED Medications  acetaminophen (TYLENOL) tablet 650 mg (has no administration in time range)    Or  acetaminophen (TYLENOL) suppository 650 mg (has no  administration in time range)  ondansetron (ZOFRAN) tablet 4 mg (has no administration in time range)  Or  ondansetron (ZOFRAN) injection 4 mg (has no administration in time range)  pantoprazole (PROTONIX) injection 40 mg (has no administration in time range)  HYDROmorphone (DILAUDID) injection 1 mg (1 mg Intravenous Given 01/14/21 1657)  ondansetron (ZOFRAN) injection 4 mg (4 mg Intravenous Given 01/14/21 1656)  famotidine (PEPCID) IVPB 20 mg premix (20 mg Intravenous New Bag/Given 01/14/21 1656)  sucralfate (CARAFATE) 1 GM/10ML suspension 1 g (1 g Oral Given 01/14/21 1651)    ED Course  I have reviewed the triage vital signs and the nursing notes.  Pertinent labs & imaging results that were available during my care of the patient were reviewed by me and considered in my medical decision making (see chart for details).    MDM Rules/Calculators/A&P MDM Number of Diagnoses or Management Options Epigastric pain: established, worsening   Amount and/or Complexity of Data Reviewed Clinical lab tests: ordered and reviewed Tests in the radiology section of CPT: reviewed Tests in the medicine section of CPT: reviewed and ordered Decide to obtain previous medical records or to obtain history from someone other than the patient: yes Obtain history from someone other than the patient: yes Review and summarize past medical records: yes Independent visualization of images, tracings, or specimens: yes  Risk of Complications, Morbidity, and/or Mortality Presenting problems: high Diagnostic procedures: high Management options: high  Critical Care Total time providing critical care: < 30 minutes  Patient Progress Patient progress: stable   Final Clinical Impression(s) / ED Diagnoses Final diagnoses:  Epigastric pain     Carmin Muskrat, MD 01/14/21 1742

## 2021-01-14 NOTE — ED Triage Notes (Signed)
Per pt, states abdominal pain for 1 week-just left AMA-states she has a bleeding ulcer "or something"

## 2021-01-14 NOTE — Progress Notes (Signed)
Pt uncooperative on several occasions throughout the shift. Pt took off telemetry leads, BP cuff, and constantly kept getting out of bed despite being educated by multiple RN's to call out first due to the fall risk.  Pt non-compliant with NPO status. Multiple RN's and NP educated pt about not eating or drinking anything due to diagnosis. Pt's bed alarm activated, RN responded and found pt eating crackers that she had brought with her. Pt stated, "I'm hungry and I am going to eat my crackers."

## 2021-01-14 NOTE — Progress Notes (Signed)
Patient wanting to leave AMA. Jolene Provost RN, Gearlean Alf RN at bedside with patient. Explained to patient if she left that it would be leaving against medical advice. Also explained to patient that leaving now and not receiving treatment for her diagnosis could put her life in extreme danger and possibly die if not treated. Patient still wanting to leave AMA but refusing to sign AMA paper. Jolene Provost RN, notified MD Rai that patient is leave AMA. Peripheral IV was removed prior to patient walking outside the room. After IV removed, patient began walking out the room but gait unsteady. Bethann Goo RN, and myself signed AMA paper work as witnesses since patient refusing to sign. Patient went down to first floor and sat in central waiting area. MD Rai met patient at the central waiting location to explain the risk of her leaving AMA. Patient drowsy but oriented x4. Patient made multiple phone calls to friend, Fritz Pickerel, to pick her up. Patient stated to Fritz Pickerel, "bring my cigarettes and pick me up". AC, Dawn, notified of situation and came to 1st floor central waiting area. AC, Dawn, discussed with patient that she could not remain in the waiting area if leaving AMA. After making additional phone calls to Fritz Pickerel, patient proceeded outside to main entrance. AC, Dawn, discussed with Fritz Pickerel via phone that we could not hold the patient against her will because she is is oriented and not a danger to self or others at this point in time. Once Fritz Pickerel arrived, patient went to his car and got into vehicle. AC, Dawn, discussed with Fritz Pickerel if patient begins to get worse that she would need to come back. Fritz Pickerel stated "he would have an ambulance bring her."

## 2021-01-14 NOTE — H&P (Signed)
History and Physical    Terri [name] [id number] DOB: [birth year] DOA: 01/14/2021  I have briefly reviewed the patient's prior medical records in [facility]  PCP: Patient, No Pcp Per (Inactive)  Patient coming from: home  Chief Complaint: continued abdominal pain  HPI: Terri [name] is a 45 y.o. female with medical history significant of hypertension, GERD with known gastric ulcer, bipolar disorder, chronic pain, gastric bypass, IV drug and snorted heroin.  She left AMA this AM and returned due to severe pain.  While complaining of her severe pain, she also was asking to eat.  Pain is described as sharp.    Per last progress note by GI: History is obtained by the patient, chart review, with notable details from hospital course, CT, below: Assessment/Plan: 1) Anastomotic ulcer. 2) Epigastric pain. 3) Cocaine abuse. 4) Severe HTN.             The patient will need an EGD, but her HTN needs to be controlled.  In the meantime she needs to be treated with medication for her anastomotic ulcer.  It is visible during this CT scan.  It may be that she will require surgical revision. Plan: 1) PPI BID. 2) Sucralfate. 3) Control HTN. 4) EGD Thursday or Friday pending her blood pressure control.    Review of Systems: As per HPI otherwise 10 point review of systems negative.   Past Medical History:  Diagnosis Date   Arthritis    Back and legs   Bipolar disorder (HCC)    Chronic back pain    Depression    DVT (deep venous thrombosis) (HCC)    early 20's leg   History of blood transfusion    Hypertension    Migraine headache    Neuropathy    Pneumonia    Positive PPD    x 2 last time 09/2017   Sciatica    Stress incontinence     Past Surgical History:  Procedure Laterality Date   ALVEOLOPLASTY Bilateral 01/31/2018   Procedure: ALVEOLOPLASTY;  Surgeon: Diona Browner, DDS;  Location: [place];  Service: Oral Surgery;  Laterality: Bilateral;   ANKLE SURGERY Left 1992   with  hardware   BACK SURGERY     BIOPSY  02/08/2019   Procedure: BIOPSY;  Surgeon: Rush Landmark Telford Nab., MD;  Location: [facility];  Service: Gastroenterology;;   CESAREAN SECTION     CHOLECYSTECTOMY N/A 02/15/2017   Procedure: LAPAROSCOPIC CHOLECYSTECTOMY;  Surgeon: Clovis Riley, MD;  Location: [facility];  Service: General;  Laterality: N/A;   ENTEROSCOPY N/A 02/08/2019   Procedure: ENTEROSCOPY;  Surgeon: Rush Landmark Telford Nab., MD;  Location: [facility];  Service: Gastroenterology;  Laterality: N/A;   ESOPHAGOGASTRODUODENOSCOPY (EGD) WITH PROPOFOL N/A 09/15/2017   Procedure: ESOPHAGOGASTRODUODENOSCOPY (EGD) WITH PROPOFOL;  Surgeon: Clarene Essex, MD;  Location[facility];  Service: Endoscopy;  Laterality: N/A;   ESOPHAGOGASTRODUODENOSCOPY (EGD) WITH PROPOFOL N/A 02/08/2019   Procedure: ESOPHAGOGASTRODUODENOSCOPY (EGD) WITH PROPOFOL;  Surgeon: Rush Landmark Telford Nab., MD;  Location[facility];  Service: Gastroenterology;  Laterality: N/A;   GASTRIC BYPASS     INCISION AND DRAINAGE OF PERITONSILLAR ABCESS Left 08/13/2017   Procedure: INCISION AND DRAINAGE OF LEFT NECK ABSCESS;  Surgeon: Melida Quitter, MD;  Location: [facility];  Service: ENT;  Laterality: Left;   LAPAROSCOPIC LYSIS OF ADHESIONS  02/15/2017   Procedure: LAPAROSCOPIC LYSIS OF ADHESIONS;  Surgeon: Clovis Riley, MD;  Location: [facility];  Service: General;;   LUMBAR [place]  LUMBAR FUSION     SAVORY DILATION N/A 02/08/2019   Procedure: SAVORY DILATION;  Surgeon: Rush Landmark Telford Nab., MD;  Location: [facility];  Service: Gastroenterology;  Laterality: N/A;   TOOTH EXTRACTION Bilateral 01/31/2018   Procedure: DENTAL RESTORATION/EXTRACTIONS;  Surgeon: Diona Browner, DDS;  Location: [place];  Service: Oral Surgery;  Laterality: Bilateral;   TUBAL LIGATION     UPPER GI ENDOSCOPY N/A 02/15/2017   Procedure: UPPER GI ENDOSCOPY;  Surgeon: Clovis Riley, MD;  Location: [facility];  Service: General;  Laterality: N/A;      reports that she has been smoking cigarettes. She has a 29.00 pack-year smoking history. She has never used smokeless tobacco. She reports current drug use. Drugs: IV and Cocaine. She reports that she does not drink alcohol.  Allergies  Allergen Reactions   Ketorolac Tromethamine Itching and Nausea And Vomiting   Methocarbamol Diarrhea    Family History  Problem Relation Age of Onset   Diabetes Mother    Hypertension Mother    Diabetes Father    Hypertension Father     Prior to Admission medications   Medication Sig Start Date End Date Taking? Authorizing Provider  acetaminophen (TYLENOL) 500 MG tablet Take 1 tablet (500 mg total) by mouth every 6 (six) hours as needed for moderate pain. Patient taking differently: Take 1,500 mg by mouth every 6 (six) hours as needed for moderate pain or mild pain. 01/31/18   Diona Browner, DMD  ibuprofen (ADVIL) 200 MG tablet Take 800 mg by mouth every 6 (six) hours as needed for moderate pain.    [provider]    Physical Exam: Vitals:   01/14/21 1313 01/14/21 1550 01/14/21 1700  BP: 120/86 118/90 (!) 126/96  Pulse: (!) 114 85 79  Resp: 16 (!) 21 17  Temp: 98.1 F (36.7 C)    TempSrc: Oral    SpO2: 98% 97% 97%      Constitutional: thin unkempt female Neck: normal, supple, no masses, no thyromegaly Respiratory: clear to auscultation bilaterally, no wheezing, no crackles. Normal respiratory effort. No accessory muscle use.  Cardiovascular: Regular rate and rhythm, no murmurs / rubs / gallops. No extremity edema. 2+ pedal pulses.  Abdomen: min tenderness, no masses palpated. Bowel sounds positive.  Musculoskeletal: no clubbing / cyanosis. Normal muscle tone.  Skin: large amount of sagging skin  Neurologic: CN 2-12 grossly intact. Strength 5/5 in all 4.  Psychiatric: poor judgment and insight. Alert and oriented x 3. Normal mood.   Labs on Admission: I have personally reviewed following labs and imaging studies  CBC: Recent  Labs  Lab 01/13/21 0349 01/14/21 1254  WBC 7.6 6.6  NEUTROABS 4.7 3.7  HGB 10.6* 12.1  HCT 32.5* 37.6  MCV 65.1* 65.4*  PLT 343 [id number]   Basic Metabolic Panel: Recent Labs  Lab 01/13/21 0349 01/14/21 1254  NA 133* 136  K 4.0 3.1*  CL 97* 100  CO2 29 24  GLUCOSE 95 89  BUN 17 12  CREATININE 0.75 0.72  CALCIUM 8.8* 8.6*   GFR: Estimated Creatinine Clearance: 87.1 mL/min (by C-G formula based on SCr of 0.72 mg/dL). Liver Function Tests: Recent Labs  Lab 01/13/21 0349 01/14/21 1254  AST 22 24  ALT 15 15  ALKPHOS 118 121  BILITOT 0.4 0.7  PROT 6.5 6.8  ALBUMIN 2.8* 3.0*   Recent Labs  Lab 01/13/21 0349  LIPASE 23   No results for input(s): AMMONIA in the last 168 hours. Coagulation Profile: No results  for input(s): INR, PROTIME in the last 168 hours. Cardiac Enzymes: No results for input(s): CKTOTAL, CKMB, CKMBINDEX, TROPONINI in the last 168 hours. BNP (last 3 results) No results for input(s): PROBNP in the last 8760 hours. HbA1C: No results for input(s): HGBA1C in the last 72 hours. CBG: No results for input(s): GLUCAP in the last 168 hours. Lipid Profile: No results for input(s): CHOL, HDL, LDLCALC, TRIG, CHOLHDL, LDLDIRECT in the last 72 hours. Thyroid Function Tests: No results for input(s): TSH, T4TOTAL, FREET4, T3FREE, THYROIDAB in the last 72 hours. Anemia Panel: No results for input(s): VITAMINB12, FOLATE, FERRITIN, TIBC, IRON, RETICCTPCT in the last 72 hours. Urine analysis:    Component Value Date/Time   COLORURINE AMBER (A) 01/13/2021 0912   APPEARANCEUR HAZY (A) 01/13/2021 0912   LABSPEC 1.015 01/13/2021 0912   PHURINE 6.0 01/13/2021 0912   GLUCOSEU NEGATIVE 01/13/2021 0912   HGBUR NEGATIVE 01/13/2021 0912   BILIRUBINUR NEGATIVE 01/13/2021 0912   KETONESUR NEGATIVE 01/13/2021 0912   PROTEINUR NEGATIVE 01/13/2021 0912   UROBILINOGEN 1.0 02/09/2015 1228   NITRITE POSITIVE (A) 01/13/2021 0912   LEUKOCYTESUR LARGE (A) 01/13/2021 0912      Radiological Exams on Admission:   Assessment/Plan Active Problems:   Hypokalemia   Heroin abuse (HCC)   Abdominal pain    Abdominal pain -just left AMA this AM  -was to have EGD tomm so will make NPO after midnight and I have messaged Dr. Benson Norway to make him aware so she can be placed on the schedule -limit narcotics -Protonix IV -carafate  Hypokalemia -check Mg -replete via IVF  Drug abuse -repeat UDS -used heroin 2 days ago per patient-- she sniffs   DVT prophylaxis: scd Code Status full Consults called: message sent to Dr. Benson Norway    Admission status: obs  At the point of initial evaluation, it is my clinical opinion that admission for OBSERVATION is reasonable and necessary because the patient's presenting complaints in the context of their chronic conditions represent sufficient risk of deterioration or significant morbidity to constitute reasonable grounds for close observation in the hospital setting, but that the patient may be medically stable for discharge from the hospital within 24 to 48 hours.      Geradine Girt [facility]   How to contact the [facility] Attending or Consulting provider [place] or covering provider during after hours [place], for this patient?  Check the care team in [facility] and look for a) attending/consulting TR[facility] provider listed and b) the [facility] team listed Log into [email or web address] and use [place]'s universal password to access. If you do not have the password, please contact the hospital operator. Locate the [facility] provider you are looking for under [facility] and page to a number that you can be directly reached. If you still have difficulty reaching the provider, please page the [facility] (Director on Call) for the Hospitalists listed on amion for assistance.  01/14/2021, 5:52 PM

## 2021-01-14 NOTE — Progress Notes (Signed)
[facility]                                                                              Patient Demographics  Terri [name], is a 45 y.o. female, DOB - 02-23-1976, [id number]  Admit date - 01/13/2021   Admitting Physician Jacqlyn Krauss, MD  Outpatient Primary MD for the patient is Patient, No Pcp Per (Inactive)  Outpatient specialists:   LOS - 1  days   Medical records reviewed and are as summarized below:    Chief Complaint  Patient presents with   Abdominal Pain       Brief summary   Patient is a 45 year old female with history of hypertension, GERD with known gastric ulcer, bipolar disorder, chronic pain, gastric bypass, IV drug use, snorted heroin, cocaine most recently 2 days prior to admission presented with nausea, epigastric abdominal pain for the last 1 week.  Patient reported no aspirin or NSAID use.  Patient was on antihypertensives however has been off of them due to losing her insurance.  No hematochezia or melena.  History of gastric sleeve in 2012.  FOBT negative. CT abdomen showed large perianastomotic ulcer in the area status post Roux-en-Y gastric bypass, anasarca, splenomegaly. Also reported foul odor in the urine, UA positive for UTI  Assessment & Plan    Principal Problem: Abdominal pain secondary to gastric ulcer at the site of surgical anastomosis -Continue n.p.o. status, IV fluids, IV PPI twice daily, antiemetics -GI consulted, recommended EGD possibly tomorrow if BP well controlled   Active Problems:    UTI (urinary tract infection) -Follow urine culture and sensitivities, continue IV Rocephin  History of substance abuse, cocaine, heroin -Last noted 2 days ago.,  Now complaining of abdominal pain, nausea, BP uncontrolled, likely going into withdrawal -Will place on clonidine withdrawal protocol    Hypertensive urgency -Patient has a history of hypertension however was not taking medications -Placed on clonidine  protocol, continue IV hydralazine scheduled, IV Vasotec scheduled.  Patient is refusing p.o. meds.    Tobacco abuse -Continue nicotine patch -Counseled strongly on smoking cessation  History of anxiety, bipolar ds -Alert and oriented on my examination however wants to leave AMA intermittently -Continue IV Ativan as needed   Code Status: Full CODE STATUS DVT Prophylaxis:  SCDs Start: 01/13/21 1453   Level of Care: Level of care: Stepdown Family Communication: Discussed all imaging results, lab results, explained to the patient   Disposition Plan:     Status is: Inpatient  Remains inpatient appropriate because:Inpatient level of care appropriate due to severity of illness  Dispo: The patient is from: Home              Anticipated d/c is to: Home              Patient currently is not medically stable to d/c.  Currently n.p.o., pending EGD   Difficult to place patient No   Time Spent in minutes   55mns   Procedures:  CT abdomen  Consultants:   GI  Antimicrobials:   Anti-infectives (From admission, onward)    Start     Dose/Rate Route Frequency Ordered  Stop   01/14/21 0900  cefTRIAXone (ROCEPHIN) 1 g in sodium chloride 0.9 % 100 mL IVPB        1 g 200 mL/hr over 30 Minutes Intravenous Every 24 hours 01/13/21 1455 01/17/21 0859   01/13/21 1230  cefTRIAXone (ROCEPHIN) 1 g in sodium chloride 0.9 % 100 mL IVPB        1 g 200 mL/hr over 30 Minutes Intravenous STAT 01/13/21 1226 01/13/21 1350          Medications  Scheduled Meds:  Chlorhexidine Gluconate Cloth  6 each Topical Daily   cloNIDine  0.1 mg Oral QID   Followed by   Derrill Memo ON 01/16/2021] cloNIDine  0.1 mg Oral BH-qamhs   Followed by   Derrill Memo ON 01/18/2021] cloNIDine  0.1 mg Oral QAC breakfast   enalaprilat  1.25 mg Intravenous NOW   enalaprilat  2.5 mg Intravenous Q6H   hydrALAZINE  10 mg Intravenous Q8H   nicotine  21 mg Transdermal Q24H   pantoprazole (PROTONIX) IV  40 mg Intravenous Q12H    sucralfate  1 g Oral TID WC & HS   Continuous Infusions:  sodium chloride 75 mL/hr at 01/13/21 1900   cefTRIAXone (ROCEPHIN)  IV     PRN Meds:.acetaminophen **OR** acetaminophen, alum & mag hydroxide-simeth, dicyclomine, HYDROcodone-acetaminophen, HYDROmorphone (DILAUDID) injection, hydrOXYzine, loperamide, LORazepam, ondansetron **OR** ondansetron (ZOFRAN) IV      Subjective:   Terri [name] was seen and examined today.  BP still uncontrolled, overnight issues with anxiety, agitation, wanting to leave AMA intermittently.  Continues to complain of epigastric pain, 8/10, nausea, no ongoing vomiting.  No fevers or chills  Objective:   Vitals:   01/14/21 0558 01/14/21 0600 01/14/21 0630 01/14/21 0700  BP: (!) 170/111 (!) 171/124 (!) 159/102 (!) 159/114  Pulse:  91 (!) 104 (!) 102  Resp:  (!) 28 20 (!) 31  Temp:      TempSrc:      SpO2:  96% 94% 97%  Weight:      Height:        Intake/Output Summary (Last 24 hours) at 01/14/2021 0757 Last data filed at 01/13/2021 1350 Gross per 24 hour  Intake 1100 ml  Output --  Net 1100 ml     Wt Readings from Last 3 Encounters:  01/13/21 63.5 kg  07/15/19 83 kg  06/26/19 83 kg     Exam General: Alert and oriented x 3, NAD, anxious and somewhat agitated Cardiovascular: S1 S2 auscultated, tachycardia, RRR Respiratory: Clear to auscultation bilaterally, no wheezing Gastrointestinal: Soft, epigastric TTP, nondistended, + bowel sounds Ext: no pedal edema bilaterally Neuro: moving all 4 extremities Musculoskeletal: No digital cyanosis, clubbing Skin: No rashes Psych: oriented x3, somewhat agitated   Data Reviewed:  I have personally reviewed following labs and imaging studies  Micro Results Recent Results (from the past 240 hour(s))  SARS CORONAVIRUS 2 (TAT 6-24 HRS) Nasopharyngeal Nasopharyngeal Swab     Status: None   Collection Time: 01/13/21  3:18 PM   Specimen: Nasopharyngeal Swab  Result Value Ref Range Status   SARS  Coronavirus 2 NEGATIVE NEGATIVE Final    Comment: (NOTE) SARS-CoV-2 target nucleic acids are NOT DETECTED.  The SARS-CoV-2 RNA is generally detectable in upper and lower respiratory specimens during the acute phase of infection. Negative results do not preclude SARS-CoV-2 infection, do not rule out co-infections with other pathogens, and should not be used as the sole basis for treatment or other patient management decisions. Negative results must be  combined with clinical observations, patient history, and epidemiological information. The expected result is Negative.  Fact Sheet for Patients: SugarRoll.be  Fact Sheet for Healthcare Providers: https://www.woods-mathews.com/  This test is not yet approved or cleared by the [place] FDA and  has been authorized for detection and/or diagnosis of SARS-CoV-2 by FDA under an Emergency Use Authorization (EUA). This EUA will remain  in effect (meaning this test can be used) for the duration of the COVID-19 declaration under Se ction 564(b)(1) of the Act, 21 U.S.C. section 360bbb-3(b)(1), unless the authorization is terminated or revoked sooner.  Performed at [facility], [place] [address] [place], [place] [address]   MRSA Next Gen by PCR, Nasal     Status: None   Collection Time: 01/13/21  8:54 PM   Specimen: Nasal Mucosa; Nasal Swab  Result Value Ref Range Status   MRSA by PCR Next Gen NOT DETECTED NOT DETECTED Final    Comment: (NOTE) The GeneXpert MRSA Assay (FDA approved for NASAL specimens only), is one component of a comprehensive MRSA colonization surveillance program. It is not intended to diagnose MRSA infection nor to guide or monitor treatment for MRSA infections. Test performance is not FDA approved in patients less than 26 years old. Performed at [facility], [place] [address] [place], [place] [address]     Radiology Reports CT ABDOMEN PELVIS W  CONTRAST  Result Date: 01/13/2021 CLINICAL DATA:  45 year old female with history of abdominal pain shooting up into her chest for 1 week. EXAM: CT ABDOMEN AND PELVIS WITH CONTRAST TECHNIQUE: Multidetector CT imaging of the abdomen and pelvis was performed using the standard protocol following bolus administration of intravenous contrast. CONTRAST:  19m OMNIPAQUE IOHEXOL 350 MG/ML SOLN COMPARISON:  CT the abdomen and pelvis 02/08/2019. FINDINGS: Lower chest: Scattered areas of mild cylindrical bronchiectasis with some associated peribronchovascular ground-glass attenuation micro nodularity and additional small pulmonary nodules scattered throughout the visualized lung bases, largest of which measures 5 mm in the left lower lobe (axial image 19 of series 6). Areas of scarring and/or subsegmental atelectasis in the lower lobes of the lungs bilaterally. Atherosclerotic calcifications in the thoracic aorta. Hepatobiliary: In the periphery of the right lobe of the liver between segments 5 and 8 (axial image 21 of series 2) there is a small subcapsular lesion measuring 1.2 x 0.5 cm, too small to characterize, but similar to the prior study and statistically likely to represent a cyst. No other larger more suspicious appearing hepatic lesions are noted. Status post cholecystectomy. Minimal intrahepatic biliary ductal dilatation and slight prominence of the common bile duct which measures 1 cm in the porta hepatis likely reflects benign post cholecystectomy physiology. Pancreas: No pancreatic mass. No pancreatic ductal dilatation. No pancreatic or peripancreatic fluid collections or inflammatory changes. Spleen: Spleen is mildly enlarged measuring 10.5 x 4.9 x 14.7 cm (estimated splenic volume of 378 mL). Adrenals/Urinary Tract: Extensive cortical thinning is noted in the left kidney. Right kidney and bilateral adrenal glands are normal in appearance. No hydroureteronephrosis. Small amount of gas non dependently in the  lumen of the urinary bladder. Urinary bladder is otherwise normal in appearance. Stomach/Bowel: Postoperative changes of Roux-en-Y gastric bypass. Adjacent to the anastomosis there is a round structure in the left upper quadrant of the abdomen (axial image 25 of series 2) which has a very thickened hazy irregular wall, concerning for an ulcer. It is uncertain whether this arises directly from the anastomotic suture line or is within the adjacent portions  of either the small bowel or stomach. There is a large amount of surrounding inflammation, very intimately associated with the proximal small bowel, well appreciated anteriorly on axial image 28 of series 2. No well-defined fluid collection is identified in this region. No pathologic dilatation of small bowel or colon. Appendix is not confidently identified may be surgically absent or atrophic. Vascular/Lymphatic: Aortic atherosclerosis, without evidence of aneurysm or dissection in the abdominal or pelvic vasculature. No lymphadenopathy noted in the abdomen or pelvis. Reproductive: Uterus and ovaries are a trophic. Other: Mild diffuse mesenteric edema. Small volume of ascites. No pneumoperitoneum. Musculoskeletal: Mild diffuse body wall edema. Status post laminectomy at L4-L5. Multilevel degenerative disc disease, most severe at L3-L4 where there are advanced discogenic endplate changes. There are no aggressive appearing lytic or blastic lesions noted in the visualized portions of the skeleton. IMPRESSION: 1. Findings are highly concerning for large perianastomotic ulcer in this patient who is status post Roux-en-Y gastric bypass, as detailed above. 2. Anasarca. 3. Compared to the prior examination there is extensive thinning of the cortex of the left kidney, presumably scarring from prior obstruction/infection. 4. Small amount of gas in the nondependent portion of the lumen of the urinary bladder on today's examination. If there has been recent catheterization for  urinalysis, this is presumably iatrogenic. In the absence of a recent history of catheterization, further evaluation with urinalysis would be recommended to exclude the possibility of urinary tract infection with gas-forming organisms. 5. Findings in the lung bases which could be seen in the setting of chronic indolent atypical infectious process such as MAI (mycobacterium avium intracellulare). Nonemergent outpatient referral to Pulmonology for further clinical evaluation is suggested. 6. Mild splenomegaly. 7. Additional incidental findings, as above. Electronically Signed   By: Vinnie Langton M.D.   On: 01/13/2021 12:12    Lab Data:  CBC: Recent Labs  Lab 01/13/21 0349  WBC 7.6  NEUTROABS 4.7  HGB 10.6*  HCT 32.5*  MCV 65.1*  PLT [id number]   Basic Metabolic Panel: Recent Labs  Lab 01/13/21 0349  NA 133*  K 4.0  CL 97*  CO2 29  GLUCOSE 95  BUN 17  CREATININE 0.75  CALCIUM 8.8*   GFR: Estimated Creatinine Clearance: 87.1 mL/min (by C-G formula based on SCr of 0.75 mg/dL). Liver Function Tests: Recent Labs  Lab 01/13/21 0349  AST 22  ALT 15  ALKPHOS 118  BILITOT 0.4  PROT 6.5  ALBUMIN 2.8*   Recent Labs  Lab 01/13/21 0349  LIPASE 23   No results for input(s): AMMONIA in the last 168 hours. Coagulation Profile: No results for input(s): INR, PROTIME in the last 168 hours. Cardiac Enzymes: No results for input(s): CKTOTAL, CKMB, CKMBINDEX, TROPONINI in the last 168 hours. BNP (last 3 results) No results for input(s): PROBNP in the last 8760 hours. HbA1C: No results for input(s): HGBA1C in the last 72 hours. CBG: No results for input(s): GLUCAP in the last 168 hours. Lipid Profile: No results for input(s): CHOL, HDL, LDLCALC, TRIG, CHOLHDL, LDLDIRECT in the last 72 hours. Thyroid Function Tests: No results for input(s): TSH, T4TOTAL, FREET4, T3FREE, THYROIDAB in the last 72 hours. Anemia Panel: No results for input(s): VITAMINB12, FOLATE, FERRITIN, TIBC, IRON,  RETICCTPCT in the last 72 hours. Urine analysis:    Component Value Date/Time   COLORURINE AMBER (A) 01/13/2021 0912   APPEARANCEUR HAZY (A) 01/13/2021 0912   LABSPEC 1.015 01/13/2021 0912   PHURINE 6.0 01/13/2021 0912   GLUCOSEU NEGATIVE 01/13/2021 0912  HGBUR NEGATIVE 01/13/2021 0912   BILIRUBINUR NEGATIVE 01/13/2021 0912   KETONESUR NEGATIVE 01/13/2021 0912   PROTEINUR NEGATIVE 01/13/2021 0912   UROBILINOGEN 1.0 02/09/2015 1228   NITRITE POSITIVE (A) 01/13/2021 0912   LEUKOCYTESUR LARGE (A) 01/13/2021 0912     Nadine Ryle M.D. [facility] 01/14/2021, 7:57 AM  Available via Epic secure chat 7am-7pm After 7 pm, please refer to night coverage provider listed on amion.

## 2021-01-15 DIAGNOSIS — Z20822 Contact with and (suspected) exposure to covid-19: Secondary | ICD-10-CM | POA: Diagnosis present

## 2021-01-15 DIAGNOSIS — R64 Cachexia: Secondary | ICD-10-CM | POA: Diagnosis present

## 2021-01-15 DIAGNOSIS — K219 Gastro-esophageal reflux disease without esophagitis: Secondary | ICD-10-CM | POA: Diagnosis present

## 2021-01-15 DIAGNOSIS — E876 Hypokalemia: Secondary | ICD-10-CM | POA: Diagnosis present

## 2021-01-15 DIAGNOSIS — F319 Bipolar disorder, unspecified: Secondary | ICD-10-CM | POA: Diagnosis present

## 2021-01-15 DIAGNOSIS — D509 Iron deficiency anemia, unspecified: Secondary | ICD-10-CM | POA: Diagnosis present

## 2021-01-15 DIAGNOSIS — R109 Unspecified abdominal pain: Secondary | ICD-10-CM | POA: Diagnosis not present

## 2021-01-15 DIAGNOSIS — Z9049 Acquired absence of other specified parts of digestive tract: Secondary | ICD-10-CM | POA: Diagnosis not present

## 2021-01-15 DIAGNOSIS — Z9119 Patient's noncompliance with other medical treatment and regimen: Secondary | ICD-10-CM | POA: Diagnosis not present

## 2021-01-15 DIAGNOSIS — F1910 Other psychoactive substance abuse, uncomplicated: Secondary | ICD-10-CM | POA: Diagnosis not present

## 2021-01-15 DIAGNOSIS — D75838 Other thrombocytosis: Secondary | ICD-10-CM | POA: Diagnosis present

## 2021-01-15 DIAGNOSIS — F17210 Nicotine dependence, cigarettes, uncomplicated: Secondary | ICD-10-CM | POA: Diagnosis present

## 2021-01-15 DIAGNOSIS — K253 Acute gastric ulcer without hemorrhage or perforation: Secondary | ICD-10-CM | POA: Diagnosis not present

## 2021-01-15 DIAGNOSIS — Z86718 Personal history of other venous thrombosis and embolism: Secondary | ICD-10-CM | POA: Diagnosis not present

## 2021-01-15 DIAGNOSIS — Z6820 Body mass index (BMI) 20.0-20.9, adult: Secondary | ICD-10-CM | POA: Diagnosis not present

## 2021-01-15 DIAGNOSIS — Z9884 Bariatric surgery status: Secondary | ICD-10-CM | POA: Diagnosis not present

## 2021-01-15 DIAGNOSIS — F1110 Opioid abuse, uncomplicated: Secondary | ICD-10-CM | POA: Diagnosis present

## 2021-01-15 DIAGNOSIS — K283 Acute gastrojejunal ulcer without hemorrhage or perforation: Secondary | ICD-10-CM | POA: Diagnosis present

## 2021-01-15 DIAGNOSIS — F419 Anxiety disorder, unspecified: Secondary | ICD-10-CM | POA: Diagnosis present

## 2021-01-15 DIAGNOSIS — E43 Unspecified severe protein-calorie malnutrition: Secondary | ICD-10-CM | POA: Diagnosis present

## 2021-01-15 DIAGNOSIS — I10 Essential (primary) hypertension: Secondary | ICD-10-CM | POA: Diagnosis present

## 2021-01-15 DIAGNOSIS — Z8249 Family history of ischemic heart disease and other diseases of the circulatory system: Secondary | ICD-10-CM | POA: Diagnosis not present

## 2021-01-15 DIAGNOSIS — Z5329 Procedure and treatment not carried out because of patient's decision for other reasons: Secondary | ICD-10-CM | POA: Diagnosis not present

## 2021-01-15 DIAGNOSIS — F1410 Cocaine abuse, uncomplicated: Secondary | ICD-10-CM | POA: Diagnosis present

## 2021-01-15 DIAGNOSIS — F1510 Other stimulant abuse, uncomplicated: Secondary | ICD-10-CM | POA: Diagnosis present

## 2021-01-15 DIAGNOSIS — R1013 Epigastric pain: Secondary | ICD-10-CM | POA: Diagnosis present

## 2021-01-15 DIAGNOSIS — G8929 Other chronic pain: Secondary | ICD-10-CM | POA: Diagnosis present

## 2021-01-15 DIAGNOSIS — Z833 Family history of diabetes mellitus: Secondary | ICD-10-CM | POA: Diagnosis not present

## 2021-01-15 LAB — COMPREHENSIVE METABOLIC PANEL
ALT: 12 U/L (ref 0–44)
AST: 17 U/L (ref 15–41)
Albumin: 2.4 g/dL — ABNORMAL LOW (ref 3.5–5.0)
Alkaline Phosphatase: 104 U/L (ref 38–126)
Anion gap: 5 (ref 5–15)
BUN: 14 mg/dL (ref 6–20)
CO2: 27 mmol/L (ref 22–32)
Calcium: 8.4 mg/dL — ABNORMAL LOW (ref 8.9–10.3)
Chloride: 104 mmol/L (ref 98–111)
Creatinine, Ser: 0.65 mg/dL (ref 0.44–1.00)
GFR, Estimated: >60 mL/min (ref >60)
Glucose, Bld: 80 mg/dL (ref 70–99)
Potassium: 3.8 mmol/L (ref 3.5–5.1)
Sodium: 136 mmol/L (ref 135–145)
Total Bilirubin: 0.6 mg/dL (ref 0.3–1.2)
Total Protein: 5.5 g/dL — ABNORMAL LOW (ref 6.5–8.1)

## 2021-01-15 LAB — CBC
HCT: 29.6 % — ABNORMAL LOW (ref 36.0–46.0)
Hemoglobin: 9.6 g/dL — ABNORMAL LOW (ref 12.0–15.0)
MCH: 21 pg — ABNORMAL LOW (ref 26.0–34.0)
MCHC: 32.4 g/dL (ref 30.0–36.0)
MCV: 64.6 fL — ABNORMAL LOW (ref 80.0–100.0)
Platelets: 302 10*3/uL (ref 150–400)
RBC: 4.58 MIL/uL (ref 3.87–5.11)
RDW: 20.7 % — ABNORMAL HIGH (ref 11.5–15.5)
WBC: 5.1 10*3/uL (ref 4.0–10.5)
nRBC: 0 % (ref 0.0–0.2)

## 2021-01-15 MED ORDER — LIDOCAINE 5 % EX PTCH
1.0000 | MEDICATED_PATCH | CUTANEOUS | Status: DC
  Administered 2021-01-15: 1 via TRANSDERMAL
  Filled 2021-01-15: qty 1

## 2021-01-15 MED ORDER — CLONIDINE HCL 0.1 MG PO TABS: 0.1000 mg | ORAL_TABLET | Freq: Every day | ORAL | Status: DC

## 2021-01-15 MED ORDER — LOPERAMIDE HCL 2 MG PO CAPS: 2.0000 mg | ORAL_CAPSULE | ORAL | Status: DC | PRN

## 2021-01-15 MED ORDER — LORAZEPAM 1 MG PO TABS: 1.0000 mg | ORAL_TABLET | ORAL | Status: DC | PRN

## 2021-01-15 MED ORDER — METHOCARBAMOL 500 MG PO TABS: 500.0000 mg | ORAL_TABLET | Freq: Three times a day (TID) | ORAL | Status: DC | PRN

## 2021-01-15 MED ORDER — NICOTINE 21 MG/24HR TD PT24
21.0000 mg | MEDICATED_PATCH | Freq: Every day | TRANSDERMAL | Status: DC
  Administered 2021-01-15: 21 mg via TRANSDERMAL
  Filled 2021-01-15: qty 1

## 2021-01-15 MED ORDER — HYDROXYZINE HCL 25 MG PO TABS
25.0000 mg | ORAL_TABLET | Freq: Four times a day (QID) | ORAL | Status: DC | PRN
  Administered 2021-01-16: 25 mg via ORAL
  Filled 2021-01-15: qty 1

## 2021-01-15 MED ORDER — DICYCLOMINE HCL 20 MG PO TABS: 20.0000 mg | ORAL_TABLET | Freq: Four times a day (QID) | ORAL | Status: DC | PRN

## 2021-01-15 MED ORDER — CLONIDINE HCL 0.1 MG PO TABS
0.1000 mg | ORAL_TABLET | Freq: Four times a day (QID) | ORAL | Status: DC
  Administered 2021-01-15 (×2): 0.1 mg via ORAL
  Filled 2021-01-15 (×3): qty 1

## 2021-01-15 MED ORDER — LORAZEPAM 2 MG/ML IJ SOLN
1.0000 mg | INTRAMUSCULAR | Status: DC | PRN
  Administered 2021-01-15 (×2): 2 mg via INTRAVENOUS
  Filled 2021-01-15 (×3): qty 1

## 2021-01-15 MED ORDER — SUCRALFATE 1 GM/10ML PO SUSP: 1.0000 g | Freq: Three times a day (TID) | ORAL | Status: DC

## 2021-01-15 MED ORDER — SODIUM CHLORIDE 0.9 % IV SOLN: INTRAVENOUS | Status: DC

## 2021-01-15 MED ORDER — CLONIDINE HCL 0.1 MG PO TABS
0.1000 mg | ORAL_TABLET | ORAL | Status: DC
Start: 2021-01-17 — End: 2021-01-16

## 2021-01-15 NOTE — Progress Notes (Signed)
Patient ID: Terri [name], female   DOB: Nov 19, 1975, 45 y.o.   MRN: [id number]  PROGRESS NOTE    HEYLEY WARDEN  [id number] DOB: 19-Oct-1975 DOA: 01/14/2021 PCP: Patient, No Pcp Per (Inactive)   Brief Narrative:  45 year old female with history of hypertension, GERD with known gastric ulcer, bipolar disorder, chronic pain, gastric bypass, illicit drug use including cocaine and heroin, recent hospitalization from 01/13/2021-01/14/2021 for abdominal pain, GI was planning to do EGD but patient signed out [place] on 01/14/2021 presented again on 01/14/2021 with continued abdominal pain.  GI was consulted again.  He was started on IV Protonix.  Assessment & Plan:   Severe abdominal pain possibly from gastric ulcer at the site of surgical anastomosis in a patient with gastric bypass -GI was planning to do EGD but patient had signed out [place] on 01/14/2021. -GI following and planning for EGD at some point. -Continue IV Protonix along with oral Carafate. -Complains of severe pain despite currently being on IV Dilaudid.  Will not escalate dose of IV Dilaudid as patient has chronic pain and probably in medication seeking behavior.  Polysubstance abuse -Urine drug screen positive for amphetamines, benzodiazepines, opiates and cocaine. -Counseled regarding cessation.  Social worker consult for the same. -Currently on IV Ativan as needed.  We will switch to clonidine withdrawal protocol.  Microcytic anemia -Hemoglobin currently stable.  Monitor  History of anxiety/bipolar disorder -Will need outpatient follow-up with psychiatry.  Hypertension -Blood pressure elevated.  Will add IV hydralazine as needed.  Hypokalemia -Improved   DVT prophylaxis: SCDs Code Status: Full Family Communication: None at bedside Disposition Plan: Status is: Observation  The patient will require care spanning > 2 midnights and should be moved to inpatient because: Inpatient level of care  appropriate due to severity of illness  Dispo: The patient is from: Home              Anticipated d/c is to: Home              Patient currently is not medically stable to d/c.   Difficult to place patient No   Consultants: GI  Procedures: None  Antimicrobials: None   Subjective: Is seen and examined at bedside.  Complains of severe abdominal pain.  States that Dilaudid is not working.  No overnight fever or vomiting reported.  Objective: Vitals:   01/14/21 1700 01/14/21 2048 01/15/21 0045 01/15/21 0528  BP: (!) 126/96 (!) 124/97 (!) 138/99 (!) 159/99  Pulse: 79 69 95 74  Resp: '17 17 20 16  '$ Temp:  98.1 F (36.7 C) 98.5 F (36.9 C) 98.3 F (36.8 C)  TempSrc:  Oral Oral Oral  SpO2: 97% 100% 100% 100%    Intake/Output Summary (Last 24 hours) at 01/15/2021 1131 Last data filed at 01/15/2021 1015 Gross per 24 hour  Intake 1300.29 ml  Output 1300 ml  Net 0.29 ml   There were no vitals filed for this visit.  Examination:  General exam: Appears in the bed and chronically deconditioned.  On room air.  Extremely poor historian. Respiratory system: Bilateral decreased breath sounds at bases Cardiovascular system: S1 & S2 heard, Rate controlled Gastrointestinal system: Abdomen is nondistended, soft and mildly tender in the epigastric region.  Normal bowel sounds heard. Extremities: No cyanosis, clubbing, edema  Central nervous system: Slow to respond; only complains about abdominal pain.  No focal neurological deficits. Moving extremities Skin: No rashes, lesions or ulcers Psychiatry: Looks anxious intermittently.  Does not engage  in conversation much as soon as topic illicit drug cessation is brought up   Data Reviewed: I have personally reviewed following labs and imaging studies  CBC: Recent Labs  Lab 01/13/21 0349 01/14/21 1254 01/15/21 0409  WBC 7.6 6.6 5.1  NEUTROABS 4.7 3.7  --   HGB 10.6* 12.1 9.6*  HCT 32.5* 37.6 29.6*  MCV 65.1* 65.4* 64.6*  PLT 343 375  [id number]   Basic Metabolic Panel: Recent Labs  Lab 01/13/21 0349 01/14/21 1254 01/15/21 0409  NA 133* 136 136  K 4.0 3.1* 3.8  CL 97* 100 104  CO2 '29 24 27  '$ GLUCOSE 95 89 80  BUN '17 12 14  '$ CREATININE 0.75 0.72 0.65  CALCIUM 8.8* 8.6* 8.4*  MG  --  2.1  --    GFR: Estimated Creatinine Clearance: 87.1 mL/min (by C-G formula based on SCr of 0.65 mg/dL). Liver Function Tests: Recent Labs  Lab 01/13/21 0349 01/14/21 1254 01/15/21 0409  AST '22 24 17  '$ ALT '15 15 12  '$ ALKPHOS 118 121 104  BILITOT 0.4 0.7 0.6  PROT 6.5 6.8 5.5*  ALBUMIN 2.8* 3.0* 2.4*   Recent Labs  Lab 01/13/21 0349  LIPASE 23   No results for input(s): AMMONIA in the last 168 hours. Coagulation Profile: No results for input(s): INR, PROTIME in the last 168 hours. Cardiac Enzymes: No results for input(s): CKTOTAL, CKMB, CKMBINDEX, TROPONINI in the last 168 hours. BNP (last 3 results) No results for input(s): PROBNP in the last 8760 hours. HbA1C: No results for input(s): HGBA1C in the last 72 hours. CBG: No results for input(s): GLUCAP in the last 168 hours. Lipid Profile: No results for input(s): CHOL, HDL, LDLCALC, TRIG, CHOLHDL, LDLDIRECT in the last 72 hours. Thyroid Function Tests: No results for input(s): TSH, T4TOTAL, FREET4, T3FREE, THYROIDAB in the last 72 hours. Anemia Panel: No results for input(s): VITAMINB12, FOLATE, FERRITIN, TIBC, IRON, RETICCTPCT in the last 72 hours. Sepsis Labs: Recent Labs  Lab 01/13/21 1853 01/13/21 2102  LATICACIDVEN 1.5 1.3    Recent Results (from the past 240 hour(s))  Urine Culture     Status: Abnormal (Preliminary result)   Collection Time: 01/13/21  9:12 AM   Specimen: Urine, Clean Catch  Result Value Ref Range Status   Specimen Description   Final    URINE, CLEAN CATCH Performed at [facility], [place] [address], [place], [place] [address]    Special Requests   Final    NONE Performed at [facility], [place]  [address] [place], [place] [address]    Culture (A)  Final    >=100,000 COLONIES/mL ESCHERICHIA COLI SUSCEPTIBILITIES TO FOLLOW Performed at [facility], [place] [address] [place], [place] [address]    Report Status PENDING  Incomplete  SARS CORONAVIRUS 2 (TAT 6-24 HRS) Nasopharyngeal Nasopharyngeal Swab     Status: None   Collection Time: 01/13/21  3:18 PM   Specimen: Nasopharyngeal Swab  Result Value Ref Range Status   SARS Coronavirus 2 NEGATIVE NEGATIVE Final    Comment: (NOTE) SARS-CoV-2 target nucleic acids are NOT DETECTED.  The SARS-CoV-2 RNA is generally detectable in upper and lower respiratory specimens during the acute phase of infection. Negative results do not preclude SARS-CoV-2 infection, do not rule out co-infections with other pathogens, and should not be used as the sole basis for treatment or other patient management decisions. Negative results must be combined with clinical observations, patient history, and epidemiological information. The expected result is  Negative.  Fact Sheet for Patients: SugarRoll.be  Fact Sheet for Healthcare Providers: https://www.woods-mathews.com/  This test is not yet approved or cleared by the [place] FDA and  has been authorized for detection and/or diagnosis of SARS-CoV-2 by FDA under an Emergency Use Authorization (EUA). This EUA will remain  in effect (meaning this test can be used) for the duration of the COVID-19 declaration under Se ction 564(b)(1) of the Act, 21 U.S.C. section 360bbb-3(b)(1), unless the authorization is terminated or revoked sooner.  Performed at [facility], [place] [address], [place], [place] [address]   Culture, blood (routine x 2)     Status: None (Preliminary result)   Collection Time: 01/13/21  6:50 PM   Specimen: BLOOD  Result Value Ref Range Status   Specimen Description   Final    BLOOD RIGHT ANTECUBITAL Performed at [place] [address] [place], [place] [address]    Special Requests   Final    BOTTLES DRAWN AEROBIC AND ANAEROBIC Blood Culture adequate volume Performed at [place] [address] [place], [place] [address]    Culture   Final    NO GROWTH 2 DAYS Performed at [place] [address], [place], [place] [address]    Report Status PENDING  Incomplete  MRSA Next Gen by PCR, Nasal     Status: None   Collection Time: 01/13/21  8:54 PM   Specimen: Nasal Mucosa; Nasal Swab  Result Value Ref Range Status   MRSA by PCR Next Gen NOT DETECTED NOT DETECTED Final    Comment: (NOTE) The GeneXpert MRSA Assay (FDA approved for NASAL specimens only), is one component of a comprehensive MRSA colonization surveillance program. It is not intended to diagnose MRSA infection nor to guide or monitor treatment for MRSA infections. Test performance is not FDA approved in patients less than 9 years old. Performed at [facility], [place] [address] [place], [place] [address]   Resp Panel by RT-PCR (Flu A&B, Covid) Nasopharyngeal Swab     Status: None   Collection Time: 01/14/21  4:01 PM   Specimen: Nasopharyngeal Swab; Nasopharyngeal(NP) swabs in vial transport medium  Result Value Ref Range Status   SARS Coronavirus 2 by RT PCR NEGATIVE NEGATIVE Final    Comment: (NOTE) SARS-CoV-2 target nucleic acids are NOT DETECTED.  The SARS-CoV-2 RNA is generally detectable in upper respiratory specimens during the acute phase of infection. The lowest concentration of SARS-CoV-2 viral copies this assay can detect is 138 copies/mL. A negative result does not preclude SARS-Cov-2 infection and should not be used as the sole basis for treatment or other patient management decisions. A negative result may occur with  improper specimen collection/handling, submission of specimen other than nasopharyngeal swab, presence of viral mutation(s) within  the areas targeted by this assay, and inadequate number of viral copies(<138 copies/mL). A negative result must be combined with clinical observations, patient history, and epidemiological information. The expected result is Negative.  Fact Sheet for Patients:  EntrepreneurPulse.com.au  Fact Sheet for Healthcare Providers:  IncredibleEmployment.be  This test is no t yet approved or cleared by the [place] FDA and  has been authorized for detection and/or diagnosis of SARS-CoV-2 by FDA under an Emergency Use Authorization (EUA). This EUA will remain  in effect (meaning this test can be used) for the duration of the COVID-19 declaration under Section 564(b)(1) of the Act, 21 U.S.C.section 360bbb-3(b)(1), unless the authorization is terminated  or revoked sooner.       Influenza A by PCR NEGATIVE NEGATIVE Final   Influenza B by PCR NEGATIVE NEGATIVE Final    Comment: (NOTE) The Xpert Xpress SARS-CoV-2/FLU/RSV plus assay is intended as an aid in the diagnosis of influenza from Nasopharyngeal swab specimens and should not be used as a sole basis for treatment. Nasal washings and aspirates are unacceptable for Xpert Xpress SARS-CoV-2/FLU/RSV testing.  Fact Sheet for Patients: EntrepreneurPulse.com.au  Fact Sheet for Healthcare Providers: IncredibleEmployment.be  This test is not yet approved or cleared by the [place] FDA and has been authorized for detection and/or diagnosis of SARS-CoV-2 by FDA under an Emergency Use Authorization (EUA). This EUA will remain in effect (meaning this test can be used) for the duration of the COVID-19 declaration under Section 564(b)(1) of the Act, 21 U.S.C. section 360bbb-3(b)(1), unless the authorization is terminated or revoked.  Performed at [facility], [place] [address], [place], [place] [address]          Radiology Studies: No  results found.      Scheduled Meds:  nicotine  21 mg Transdermal Daily   pantoprazole (PROTONIX) IV  40 mg Intravenous Q12H   sucralfate  1 g Oral TID WC & HS   Continuous Infusions:  lactated ringers with kcl 75 mL/hr at 01/15/21 0655          Aline August, MD [facility] 01/15/2021, 11:31 AM

## 2021-01-15 NOTE — Progress Notes (Signed)
Messaged the MD to change frequency of pain med w/no new orders written or received. Currently giving Tylenol as needed in between Dilaudid with some relief noted. Reported in Handoff.

## 2021-01-15 NOTE — Progress Notes (Signed)
Subjective: Still with abdominal pain.  Objective: Vital signs in last 24 hours: Temp:  [98.1 F (36.7 C)-98.5 F (36.9 C)] 98.3 F (36.8 C) (08/25 0528) Pulse Rate:  [69-114] 74 (08/25 0528) Resp:  [16-21] 16 (08/25 0528) BP: (118-159)/(86-99) 159/99 (08/25 0528) SpO2:  [97 %-100 %] 100 % (08/25 0528) Last BM Date: 01/14/21  Intake/Output from previous day: 08/24 0701 - 08/25 0700 In: 1052.3 [P.O.:360; I.V.:692.3] Out: 700 [Urine:700] Intake/Output this shift: No intake/output data recorded.  General appearance: alert and moderate distress GI: tender in the epigastrium  Lab Results: Recent Labs    01/13/21 0349 01/14/21 1254 01/15/21 0409  WBC 7.6 6.6 5.1  HGB 10.6* 12.1 9.6*  HCT 32.5* 37.6 29.6*  PLT 343 375 302   BMET Recent Labs    01/13/21 0349 01/14/21 1254 01/15/21 0409  NA 133* 136 136  K 4.0 3.1* 3.8  CL 97* 100 104  CO2 '29 24 27  '$ GLUCOSE 95 89 80  BUN '17 12 14  '$ CREATININE 0.75 0.72 0.65  CALCIUM 8.8* 8.6* 8.4*   LFT Recent Labs    01/15/21 0409  PROT 5.5*  ALBUMIN 2.4*  AST 17  ALT 12  ALKPHOS 104  BILITOT 0.6   PT/INR No results for input(s): LABPROT, INR in the last 72 hours. Hepatitis Panel No results for input(s): HEPBSAG, HCVAB, HEPAIGM, HEPBIGM in the last 72 hours. C-Diff No results for input(s): CDIFFTOX in the last 72 hours. Fecal Lactopherrin No results for input(s): FECLLACTOFRN in the last 72 hours.  Studies/Results: CT ABDOMEN PELVIS W CONTRAST  Result Date: 01/13/2021 CLINICAL DATA:  45 year old female with history of abdominal pain shooting up into her chest for 1 week. EXAM: CT ABDOMEN AND PELVIS WITH CONTRAST TECHNIQUE: Multidetector CT imaging of the abdomen and pelvis was performed using the standard protocol following bolus administration of intravenous contrast. CONTRAST:  55m OMNIPAQUE IOHEXOL 350 MG/ML SOLN COMPARISON:  CT the abdomen and pelvis 02/08/2019. FINDINGS: Lower chest: Scattered areas of mild  cylindrical bronchiectasis with some associated peribronchovascular ground-glass attenuation micro nodularity and additional small pulmonary nodules scattered throughout the visualized lung bases, largest of which measures 5 mm in the left lower lobe (axial image 19 of series 6). Areas of scarring and/or subsegmental atelectasis in the lower lobes of the lungs bilaterally. Atherosclerotic calcifications in the thoracic aorta. Hepatobiliary: In the periphery of the right lobe of the liver between segments 5 and 8 (axial image 21 of series 2) there is a small subcapsular lesion measuring 1.2 x 0.5 cm, too small to characterize, but similar to the prior study and statistically likely to represent a cyst. No other larger more suspicious appearing hepatic lesions are noted. Status post cholecystectomy. Minimal intrahepatic biliary ductal dilatation and slight prominence of the common bile duct which measures 1 cm in the porta hepatis likely reflects benign post cholecystectomy physiology. Pancreas: No pancreatic mass. No pancreatic ductal dilatation. No pancreatic or peripancreatic fluid collections or inflammatory changes. Spleen: Spleen is mildly enlarged measuring 10.5 x 4.9 x 14.7 cm (estimated splenic volume of 378 mL). Adrenals/Urinary Tract: Extensive cortical thinning is noted in the left kidney. Right kidney and bilateral adrenal glands are normal in appearance. No hydroureteronephrosis. Small amount of gas non dependently in the lumen of the urinary bladder. Urinary bladder is otherwise normal in appearance. Stomach/Bowel: Postoperative changes of Roux-en-Y gastric bypass. Adjacent to the anastomosis there is a round structure in the left upper quadrant of the abdomen (axial image 25 of series 2)  which has a very thickened hazy irregular wall, concerning for an ulcer. It is uncertain whether this arises directly from the anastomotic suture line or is within the adjacent portions of either the small bowel or  stomach. There is a large amount of surrounding inflammation, very intimately associated with the proximal small bowel, well appreciated anteriorly on axial image 28 of series 2. No well-defined fluid collection is identified in this region. No pathologic dilatation of small bowel or colon. Appendix is not confidently identified may be surgically absent or atrophic. Vascular/Lymphatic: Aortic atherosclerosis, without evidence of aneurysm or dissection in the abdominal or pelvic vasculature. No lymphadenopathy noted in the abdomen or pelvis. Reproductive: Uterus and ovaries are a trophic. Other: Mild diffuse mesenteric edema. Small volume of ascites. No pneumoperitoneum. Musculoskeletal: Mild diffuse body wall edema. Status post laminectomy at L4-L5. Multilevel degenerative disc disease, most severe at L3-L4 where there are advanced discogenic endplate changes. There are no aggressive appearing lytic or blastic lesions noted in the visualized portions of the skeleton. IMPRESSION: 1. Findings are highly concerning for large perianastomotic ulcer in this patient who is status post Roux-en-Y gastric bypass, as detailed above. 2. Anasarca. 3. Compared to the prior examination there is extensive thinning of the cortex of the left kidney, presumably scarring from prior obstruction/infection. 4. Small amount of gas in the nondependent portion of the lumen of the urinary bladder on today's examination. If there has been recent catheterization for urinalysis, this is presumably iatrogenic. In the absence of a recent history of catheterization, further evaluation with urinalysis would be recommended to exclude the possibility of urinary tract infection with gas-forming organisms. 5. Findings in the lung bases which could be seen in the setting of chronic indolent atypical infectious process such as MAI (mycobacterium avium intracellulare). Nonemergent outpatient referral to Pulmonology for further clinical evaluation is  suggested. 6. Mild splenomegaly. 7. Additional incidental findings, as above. Electronically Signed   By: Vinnie Langton M.D.   On: 01/13/2021 12:12    Medications: Scheduled:  pantoprazole (PROTONIX) IV  40 mg Intravenous Q12H   sucralfate  1 g Oral TID WC & HS   Continuous:  lactated ringers with kcl 75 mL/hr at 01/15/21 [id number]    Assessment/Plan: 1) Anastomotic ulcer. 2) Epigastric pain. 3) HTN - better controlled.   The patient is stable.  Her HGB did decline.  Plan: 1) Continue with sucralfate and pantoprazole. 2) EGD tomorrow.  LOS: 0 days   Nyasiah Moffet D 01/15/2021, 8:28 AM

## 2021-01-15 NOTE — Plan of Care (Signed)

## 2021-01-15 NOTE — Progress Notes (Signed)
Pt c/o back pain 10/10. Md messaged to increase dose of pain med w/ no new order written. Dr Starla Link aware pt has threatened to leave AMA. No new orders.

## 2021-01-15 NOTE — Progress Notes (Signed)
Dr Starla Link aware via phone pt requested a Nicoderm patch. See new order entered.

## 2021-01-15 NOTE — Progress Notes (Signed)
Patient trying to smoke in bathroom. Explained hospital non smoking policy. Patient verbalized understanding. Request for patient to hand over smoking paraphernalia. Pt complied. 1 open pack of Crown cigarettes 2 lighters:  red and orange in color  Labeled in plastic bag and placed to chart front. Charge Nurse updated.

## 2021-01-15 NOTE — TOC Initial Note (Signed)
Transition of Care [facility]) - Initial/Assessment Note   Patient Details  Name: Terri [name] MRN: [id number] Date of Birth: Dec 23, 1975  Transition of [facility]) CM/SW Contact:    Sherie Don, LCSW Phone Number: 01/15/2021, 12:28 PM  Clinical Narrative: Patient is a 45 year old female who was admitted for abdominal pain. TOC consulted for substance use resources. CSW spoke with patient regarding SA resources. Patient is agreeable. CSW provided patient with outpatient, residential, and intensive outpatient resources. TOC signing off, but can be consulted again if additional needs arise.  Expected Discharge Plan: Home/Self Care Barriers to Discharge: Continued Medical Work up  Patient Goals and CMS Choice Choice offered to / list presented to : NA  Expected Discharge Plan and Services Expected Discharge Plan: Home/Self Care In-house Referral: Clinical Social Work Post Acute Care Choice: NA Living arrangements for the past 2 months: Apartment              DME Arranged: N/A DME Agency: NA  Prior Living Arrangements/Services Living arrangements for the past 2 months: Apartment Patient language and need for interpreter reviewed:: Yes Do you feel safe going back to the place where you live?: Yes      Need for Family Participation in Patient Care: No (Comment) Care giver support system in place?: Yes (comment) Criminal Activity/Legal Involvement Pertinent to Current Situation/Hospitalization: No - Comment as needed  Activities of Daily Living Home Assistive Devices/Equipment: None ADL Screening (condition at time of admission) Patient's cognitive ability adequate to safely complete daily activities?: Yes Is the patient deaf or have difficulty hearing?: No Does the patient have difficulty seeing, even when wearing glasses/contacts?: No Does the patient have difficulty concentrating, remembering, or making decisions?: No Patient able to express need for assistance with ADLs?: Yes Does  the patient have difficulty dressing or bathing?: No Independently performs ADLs?: Yes (appropriate for developmental age) Does the patient have difficulty walking or climbing stairs?: No Weakness of Legs: None Weakness of Arms/Hands: None  Emotional Assessment Appearance:: Appears stated age Attitude/Demeanor/Rapport: Lethargic Affect (typically observed): Accepting Orientation: : Oriented to Self, Oriented to Place, Oriented to  Time, Oriented to Situation Alcohol / Substance Use: Illicit Drugs  Admission diagnosis:  Epigastric pain [R10.13] Abdominal pain [R10.9] Patient Active Problem List   Diagnosis Date Noted   Abdominal pain 01/14/2021   Ulcer at site of surgical anastomosis following bypass of stomach 01/13/2021   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 coli) ([place]) 02/11/2019   AKI (acute kidney injury) ([place])    Gastric ulcer 02/09/2019   S/P gastric bypass 02/09/2019   Persistent fever 02/09/2019   COVID-19 virus not detected    Chest pain    Bacteremia due to Escherichia coli    Esophagitis    Hypokalemia    Hypomagnesemia    Sepsis ([place]) 02/02/2019   Leucocytosis 02/02/2019   Thrombocytosis 02/02/2019   UTI (urinary tract infection) 02/02/2019   GIB (gastrointestinal bleeding) 09/13/2017   Apnea 09/13/2017   Essential hypertension    Cellulitis and abscess of neck 08/12/2017   Cellulitis of submandibular region 08/10/2017   Odontogenic infection of jaw 08/10/2017   Cholecystitis 02/14/2017   Opioid abuse with opioid-induced mood disorder ([place]) 01/25/2017   Anxiety 02/11/2014   Depression 07/12/2012  PCP:  Patient, No Pcp Per (Inactive) Pharmacy:   [id number] [id number]: [phone]: [phone] Readmission Risk Interventions No flowsheet data found.

## 2021-01-16 ENCOUNTER — Other Ambulatory Visit: Payer: Self-pay

## 2021-01-16 ENCOUNTER — Encounter (HOSPITAL_COMMUNITY)
Admission: EM | Discharge status: Against Medical Advice | Payer: Self-pay | Source: Home / Self Care | Attending: Internal Medicine

## 2021-01-16 ENCOUNTER — Inpatient Hospital Stay (HOSPITAL_COMMUNITY): Payer: Medicaid Other | Admitting: Anesthesiology

## 2021-01-16 ENCOUNTER — Encounter (HOSPITAL_COMMUNITY): Payer: Self-pay

## 2021-01-16 ENCOUNTER — Inpatient Hospital Stay (HOSPITAL_COMMUNITY)
Admission: EM | Admit: 2021-01-16 | Discharge: 2021-01-18 | Discharge status: Against Medical Advice | Payer: Medicaid Other | Source: Home / Self Care | Attending: Internal Medicine | Admitting: Internal Medicine

## 2021-01-16 DIAGNOSIS — F17210 Nicotine dependence, cigarettes, uncomplicated: Secondary | ICD-10-CM | POA: Diagnosis present

## 2021-01-16 DIAGNOSIS — D75838 Other thrombocytosis: Secondary | ICD-10-CM | POA: Diagnosis present

## 2021-01-16 DIAGNOSIS — F1110 Opioid abuse, uncomplicated: Secondary | ICD-10-CM | POA: Diagnosis present

## 2021-01-16 DIAGNOSIS — Z9119 Patient's noncompliance with other medical treatment and regimen: Secondary | ICD-10-CM

## 2021-01-16 DIAGNOSIS — F1410 Cocaine abuse, uncomplicated: Secondary | ICD-10-CM | POA: Diagnosis present

## 2021-01-16 DIAGNOSIS — I10 Essential (primary) hypertension: Secondary | ICD-10-CM | POA: Diagnosis present

## 2021-01-16 DIAGNOSIS — E876 Hypokalemia: Secondary | ICD-10-CM | POA: Diagnosis present

## 2021-01-16 DIAGNOSIS — Z20822 Contact with and (suspected) exposure to covid-19: Secondary | ICD-10-CM | POA: Diagnosis present

## 2021-01-16 DIAGNOSIS — Z934 Other artificial openings of gastrointestinal tract status: Secondary | ICD-10-CM

## 2021-01-16 DIAGNOSIS — Z9049 Acquired absence of other specified parts of digestive tract: Secondary | ICD-10-CM

## 2021-01-16 DIAGNOSIS — F1910 Other psychoactive substance abuse, uncomplicated: Secondary | ICD-10-CM

## 2021-01-16 DIAGNOSIS — Z720 Tobacco use: Secondary | ICD-10-CM | POA: Diagnosis present

## 2021-01-16 DIAGNOSIS — K259 Gastric ulcer, unspecified as acute or chronic, without hemorrhage or perforation: Principal | ICD-10-CM | POA: Diagnosis present

## 2021-01-16 DIAGNOSIS — R64 Cachexia: Secondary | ICD-10-CM | POA: Diagnosis present

## 2021-01-16 DIAGNOSIS — R109 Unspecified abdominal pain: Secondary | ICD-10-CM

## 2021-01-16 DIAGNOSIS — D509 Iron deficiency anemia, unspecified: Secondary | ICD-10-CM | POA: Diagnosis present

## 2021-01-16 DIAGNOSIS — F1310 Sedative, hypnotic or anxiolytic abuse, uncomplicated: Secondary | ICD-10-CM | POA: Diagnosis present

## 2021-01-16 DIAGNOSIS — D75839 Thrombocytosis, unspecified: Secondary | ICD-10-CM | POA: Diagnosis present

## 2021-01-16 DIAGNOSIS — E43 Unspecified severe protein-calorie malnutrition: Secondary | ICD-10-CM | POA: Diagnosis present

## 2021-01-16 DIAGNOSIS — F319 Bipolar disorder, unspecified: Secondary | ICD-10-CM | POA: Diagnosis present

## 2021-01-16 DIAGNOSIS — K219 Gastro-esophageal reflux disease without esophagitis: Secondary | ICD-10-CM | POA: Diagnosis present

## 2021-01-16 DIAGNOSIS — Z5329 Procedure and treatment not carried out because of patient's decision for other reasons: Secondary | ICD-10-CM | POA: Diagnosis present

## 2021-01-16 DIAGNOSIS — F1510 Other stimulant abuse, uncomplicated: Secondary | ICD-10-CM | POA: Diagnosis present

## 2021-01-16 DIAGNOSIS — Z9884 Bariatric surgery status: Secondary | ICD-10-CM

## 2021-01-16 DIAGNOSIS — R1013 Epigastric pain: Secondary | ICD-10-CM

## 2021-01-16 DIAGNOSIS — Z86718 Personal history of other venous thrombosis and embolism: Secondary | ICD-10-CM

## 2021-01-16 DIAGNOSIS — Z6820 Body mass index (BMI) 20.0-20.9, adult: Secondary | ICD-10-CM

## 2021-01-16 DIAGNOSIS — K279 Peptic ulcer, site unspecified, unspecified as acute or chronic, without hemorrhage or perforation: Secondary | ICD-10-CM | POA: Diagnosis present

## 2021-01-16 LAB — URINE CULTURE: Culture: >=100000 — AB

## 2021-01-16 LAB — CBC WITH DIFFERENTIAL/PLATELET
Abs Immature Granulocytes: 0.03 10*3/uL (ref 0.00–0.07)
Basophils Absolute: 0.1 10*3/uL (ref 0.0–0.1)
Basophils Relative: 1 %
Eosinophils Absolute: 0.1 10*3/uL (ref 0.0–0.5)
Eosinophils Relative: 1 %
HCT: 37.2 % (ref 36.0–46.0)
Hemoglobin: 11.8 g/dL — ABNORMAL LOW (ref 12.0–15.0)
Immature Granulocytes: 0 %
Lymphocytes Relative: 34 %
Lymphs Abs: 3.7 10*3/uL (ref 0.7–4.0)
MCH: 21.3 pg — ABNORMAL LOW (ref 26.0–34.0)
MCHC: 31.7 g/dL (ref 30.0–36.0)
MCV: 67 fL — ABNORMAL LOW (ref 80.0–100.0)
Monocytes Absolute: 0.8 10*3/uL (ref 0.1–1.0)
Monocytes Relative: 8 %
Neutro Abs: 6.1 10*3/uL (ref 1.7–7.7)
Neutrophils Relative %: 56 %
Platelets: 417 10*3/uL — ABNORMAL HIGH (ref 150–400)
RBC: 5.55 MIL/uL — ABNORMAL HIGH (ref 3.87–5.11)
RDW: 21.8 % — ABNORMAL HIGH (ref 11.5–15.5)
WBC: 10.7 10*3/uL — ABNORMAL HIGH (ref 4.0–10.5)
nRBC: 0 % (ref 0.0–0.2)

## 2021-01-16 LAB — COMPREHENSIVE METABOLIC PANEL
ALT: 13 U/L (ref 0–44)
AST: 19 U/L (ref 15–41)
Albumin: 2.6 g/dL — ABNORMAL LOW (ref 3.5–5.0)
Alkaline Phosphatase: 97 U/L (ref 38–126)
Anion gap: 6 (ref 5–15)
BUN: 15 mg/dL (ref 6–20)
CO2: 26 mmol/L (ref 22–32)
Calcium: 8.5 mg/dL — ABNORMAL LOW (ref 8.9–10.3)
Chloride: 103 mmol/L (ref 98–111)
Creatinine, Ser: 1.01 mg/dL — ABNORMAL HIGH (ref 0.44–1.00)
GFR, Estimated: >60 mL/min (ref >60)
Glucose, Bld: 83 mg/dL (ref 70–99)
Potassium: 3.3 mmol/L — ABNORMAL LOW (ref 3.5–5.1)
Sodium: 135 mmol/L (ref 135–145)
Total Bilirubin: 0.3 mg/dL (ref 0.3–1.2)
Total Protein: 6 g/dL — ABNORMAL LOW (ref 6.5–8.1)

## 2021-01-16 LAB — RESP PANEL BY RT-PCR (FLU A&B, COVID) ARPGX2
Influenza A by PCR: NEGATIVE
Influenza B by PCR: NEGATIVE
SARS Coronavirus 2 by RT PCR: NEGATIVE

## 2021-01-16 LAB — LIPASE, BLOOD: Lipase: 37 U/L (ref 11–51)

## 2021-01-16 SURGERY — CANCELLED PROCEDURE

## 2021-01-16 MED ORDER — FAMOTIDINE IN NACL 20-0.9 MG/50ML-% IV SOLN
20.0000 mg | Freq: Once | INTRAVENOUS | Status: AC
  Administered 2021-01-16: 20 mg via INTRAVENOUS
  Filled 2021-01-16: qty 50

## 2021-01-16 MED ORDER — HYDRALAZINE HCL 20 MG/ML IJ SOLN
10.0000 mg | Freq: Once | INTRAMUSCULAR | Status: AC
  Administered 2021-01-16: 10 mg via INTRAVENOUS

## 2021-01-16 MED ORDER — ONDANSETRON HCL 4 MG/2ML IJ SOLN
4.0000 mg | Freq: Once | INTRAMUSCULAR | Status: AC
  Administered 2021-01-16: 4 mg via INTRAVENOUS
  Filled 2021-01-16: qty 2

## 2021-01-16 MED ORDER — KETAMINE HCL 10 MG/ML IJ SOLN
INTRAMUSCULAR | Status: AC
  Filled 2021-01-16: qty 1

## 2021-01-16 MED ORDER — HYDROMORPHONE HCL 1 MG/ML IJ SOLN
1.0000 mg | Freq: Once | INTRAMUSCULAR | Status: AC
Start: 2021-01-16 — End: 2021-01-16
  Administered 2021-01-16: 1 mg via INTRAMUSCULAR
  Filled 2021-01-16: qty 1

## 2021-01-16 MED ORDER — SUCRALFATE 1 G PO TABS
1.0000 g | ORAL_TABLET | Freq: Once | ORAL | Status: AC
  Administered 2021-01-16: 1 g via ORAL
  Filled 2021-01-16: qty 1

## 2021-01-16 MED ORDER — LACTATED RINGERS IV SOLN
INTRAVENOUS | Status: AC | PRN
  Administered 2021-01-16: 20 mL/h via INTRAVENOUS

## 2021-01-16 MED ORDER — HYDRALAZINE HCL 20 MG/ML IJ SOLN
10.0000 mg | Freq: Once | INTRAMUSCULAR | Status: AC
  Administered 2021-01-16: 10 mg via INTRAVENOUS
  Filled 2021-01-16: qty 1

## 2021-01-16 MED ORDER — HYDRALAZINE HCL 20 MG/ML IJ SOLN
INTRAMUSCULAR | Status: AC
  Filled 2021-01-16: qty 1

## 2021-01-16 SURGICAL SUPPLY — 15 items

## 2021-01-16 NOTE — H&P (Signed)
History and Physical    Terri [name] [id number] DOB: 1975/06/18 DOA: 01/16/2021  PCP: Patient, No Pcp Per (Inactive)   Patient coming from: Home  Chief Complaint: Abdominal pain  HPI: Terri [name] is a 45 y.o. female with medical history significant for HTN but not on meds at home, GERD, gastric ulcer, polysubstance disorder, s/p gastric bypass surgery who left earlier today from hospital AMA after EGD was postponed due to having elevated blood pressure. She returned to ER due to having severe pain in her abdomen. Denies having vomiting or diarrhea after leaving. She was upset that she was not allowed to eat  she states. Describes pain as sharp and 10/10 in abdomen that does not radiate.    ED Course: She has been hemodynamically stable in the emergency room complaining of abdominal pain.  She was given IM Dilaudid in the ER emergency room for for IV access was obtained.  Has been started on IV fluids.  Patient now states she will stay for work-up and evaluation by GI.  WBC 10,700 hemoglobin 11.8 (was 9.6 yesterday) hematocrit 37.2 platelets 417,000.  CMP has been drawn and is pending.  CT of the abdomen/pelvis been ordered emergency room and is pending.  UDS is added to orders.  Hospitalist service asked to admit for further management  Review of Systems:  General: Denies fever, chills, weight loss, night sweats.  Denies dizziness.  HENT: Denies head trauma, headache, denies change in hearing, tinnitus.  Denies nasal congestion.  Denies sore throat Eyes: Denies blurry vision, pain in eye, drainage.   Neck: Denies pain.  Denies swelling.  Denies pain with movement. Cardiovascular: Denies chest pain, palpitations.  Denies edema.  Denies orthopnea Respiratory: Denies shortness of breath, cough.  Denies wheezing.  Denies sputum production Gastrointestinal: Reports abdominal pain. Denies nausea, vomiting, diarrhea. Denies melena.  Denies hematemesis. Musculoskeletal: Denies limitation of  movement.  Denies deformity or swelling. Genitourinary: Denies pelvic pain.  Denies urinary frequency or hesitancy.  Denies dysuria.  Skin: Denies rash.  Denies petechiae, purpura, ecchymosis. Neurological: Denies syncope.  Denies seizure activity.  Denies paresthesia. Psychiatric: Denies depression, Denies hallucinations.  Past Medical History:  Diagnosis Date   Arthritis    Back and legs   Bipolar disorder (HCC)    Chronic back pain    Depression    DVT (deep venous thrombosis) (HCC)    early 20's leg   History of blood transfusion    Hypertension    Migraine headache    Neuropathy    Pneumonia    Positive PPD    x 2 last time 09/2017   Sciatica    Stress incontinence     Past Surgical History:  Procedure Laterality Date   ALVEOLOPLASTY Bilateral 01/31/2018   Procedure: ALVEOLOPLASTY;  Surgeon: Diona Browner, DDS;  Location: [facility];  Service: Oral Surgery;  Laterality: Bilateral;   ANKLE SURGERY Left 1992   with hardware   BACK SURGERY     BIOPSY  02/08/2019   Procedure: BIOPSY;  Surgeon: Rush Landmark Telford Nab., MD;  Location: [facility];  Service: Gastroenterology;;   CESAREAN SECTION     CHOLECYSTECTOMY N/A 02/15/2017   Procedure: LAPAROSCOPIC CHOLECYSTECTOMY;  Surgeon: Clovis Riley, MD;  Location: [facility];  Service: General;  Laterality: N/A;   ENTEROSCOPY N/A 02/08/2019   Procedure: ENTEROSCOPY;  Surgeon: Rush Landmark Telford Nab., MD;  Location: [facility];  Service: Gastroenterology;  Laterality: N/A;   ESOPHAGOGASTRODUODENOSCOPY (EGD) WITH PROPOFOL N/A 09/15/2017   Procedure: ESOPHAGOGASTRODUODENOSCOPY (EGD)  WITH PROPOFOL;  Surgeon: Clarene Essex, MD;  Location: [facility];  Service: Endoscopy;  Laterality: N/A;   ESOPHAGOGASTRODUODENOSCOPY (EGD) WITH PROPOFOL N/A 02/08/2019   Procedure: ESOPHAGOGASTRODUODENOSCOPY (EGD) WITH PROPOFOL;  Surgeon: Rush Landmark Telford Nab., MD;  Location: [facility];  Service: Gastroenterology;  Laterality: N/A;   GASTRIC BYPASS      INCISION AND DRAINAGE OF PERITONSILLAR ABCESS Left 08/13/2017   Procedure: INCISION AND DRAINAGE OF LEFT NECK ABSCESS;  Surgeon: Melida Quitter, MD;  Location: [facility];  Service: ENT;  Laterality: Left;   LAPAROSCOPIC LYSIS OF ADHESIONS  02/15/2017   Procedure: LAPAROSCOPIC LYSIS OF ADHESIONS;  Surgeon: Clovis Riley, MD;  Location: [facility];  Service: General;;   LUMBAR DISC SURGERY     LUMBAR FUSION     SAVORY DILATION N/A 02/08/2019   Procedure: SAVORY DILATION;  Surgeon: Irving Copas., MD;  Location: [facility];  Service: Gastroenterology;  Laterality: N/A;   TOOTH EXTRACTION Bilateral 01/31/2018   Procedure: DENTAL RESTORATION/EXTRACTIONS;  Surgeon: Diona Browner, DDS;  Location: [facility];  Service: Oral Surgery;  Laterality: Bilateral;   TUBAL LIGATION     UPPER GI ENDOSCOPY N/A 02/15/2017   Procedure: UPPER GI ENDOSCOPY;  Surgeon: Clovis Riley, MD;  Location: [facility];  Service: General;  Laterality: N/A;    Social History  reports that she has been smoking cigarettes. She has a 29.00 pack-year smoking history. She has never used smokeless tobacco. She reports current drug use. Drugs: IV and Cocaine. She reports that she does not drink alcohol.  Allergies  Allergen Reactions   Ketorolac Tromethamine Itching and Nausea And Vomiting   Methocarbamol Diarrhea    Family History  Problem Relation Age of Onset   Diabetes Mother    Hypertension Mother    Diabetes Father    Hypertension Father      Prior to Admission medications   Medication Sig Start Date End Date Taking? Authorizing Provider  acetaminophen (TYLENOL) 500 MG tablet Take 1 tablet (500 mg total) by mouth every 6 (six) hours as needed for moderate pain. Patient not taking: No sig reported 01/31/18   Diona Browner, DMD  ibuprofen (ADVIL) 200 MG tablet Take 800 mg by mouth every 6 (six) hours as needed for moderate pain. Patient not taking: Reported on 01/14/2021    [provider]    Physical  Exam: Vitals:   01/16/21 2011 01/16/21 2100 01/16/21 2107 01/16/21 2200  BP: (!) 140/104 (!) 119/92  (!) 133/97  Pulse: 79 72 72 77  Resp: '19 18  18  '$ Temp:      TempSrc:      SpO2: 100% 99% 98% 100%    Constitutional: NAD, calm, comfortable Vitals:   01/16/21 2011 01/16/21 2100 01/16/21 2107 01/16/21 2200  BP: (!) 140/104 (!) 119/92  (!) 133/97  Pulse: 79 72 72 77  Resp: '19 18  18  '$ Temp:      TempSrc:      SpO2: 100% 99% 98% 100%   General: WDWN, Alert and oriented x3.  Eyes: EOMI, PERRL, conjunctivae normal.  Sclera nonicteric HENT:  [place]/AT, external ears normal.  Nares patent without epistasis.  Mucous membranes are dry.  Edentulous Neck: Soft, normal range of motion, supple, no masses,Trachea midline Respiratory: clear to auscultation bilaterally, no wheezing, no crackles. Normal respiratory effort. No accessory muscle use.  Cardiovascular: Regular rate and rhythm, no murmurs / rubs / gallops. No extremity edema.  Abdomen: Soft, generalized abdominal tenderness to palpation. nondistended, no rebound or guarding.  No masses palpated. No hepatosplenomegaly. Bowel sounds normoactive Musculoskeletal: FROM. no cyanosis. Normal muscle tone.  Skin: Warm, dry, intact no rashes, lesions, ulcers. No induration Neurologic: CN 2-12 grossly intact.  Normal speech.  Moves all 4 extremities spontaneously.   Psychiatric: Poor judgement and insight. Normal mood.    Labs on Admission: I have personally reviewed following labs and imaging studies  CBC: Recent Labs  Lab 01/13/21 0349 01/14/21 1254 01/15/21 0409 01/16/21 1940  WBC 7.6 6.6 5.1 10.7*  NEUTROABS 4.7 3.7  --  6.1  HGB 10.6* 12.1 9.6* 11.8*  HCT 32.5* 37.6 29.6* 37.2  MCV 65.1* 65.4* 64.6* 67.0*  PLT 343 375 302 417*    Basic Metabolic Panel: Recent Labs  Lab 01/13/21 0349 01/14/21 1254 01/15/21 0409  NA 133* 136 136  K 4.0 3.1* 3.8  CL 97* 100 104  CO2 '29 24 27  '$ GLUCOSE 95 89 80  BUN '17 12 14  '$ CREATININE 0.75  0.72 0.65  CALCIUM 8.8* 8.6* 8.4*  MG  --  2.1  --     GFR: Estimated Creatinine Clearance: 87.1 mL/min (by C-G formula based on SCr of 0.65 mg/dL).  Liver Function Tests: Recent Labs  Lab 01/13/21 0349 01/14/21 1254 01/15/21 0409  AST '22 24 17  '$ ALT '15 15 12  '$ ALKPHOS 118 121 104  BILITOT 0.4 0.7 0.6  PROT 6.5 6.8 5.5*  ALBUMIN 2.8* 3.0* 2.4*    Urine analysis:    Component Value Date/Time   COLORURINE AMBER (A) 01/13/2021 0912   APPEARANCEUR HAZY (A) 01/13/2021 0912   LABSPEC 1.015 01/13/2021 0912   PHURINE 6.0 01/13/2021 0912   GLUCOSEU NEGATIVE 01/13/2021 0912   HGBUR NEGATIVE 01/13/2021 0912   BILIRUBINUR NEGATIVE 01/13/2021 0912   KETONESUR NEGATIVE 01/13/2021 0912   PROTEINUR NEGATIVE 01/13/2021 0912   UROBILINOGEN 1.0 02/09/2015 1228   NITRITE POSITIVE (A) 01/13/2021 0912   LEUKOCYTESUR LARGE (A) 01/13/2021 0912    Radiological Exams on Admission: No results found.   Assessment/Plan   Gastric ulcer Ms. Kelter is readmitted to Med-Surg floor.  Consult GI to reevaluate her as she needs an EGD.  PPI therapy bid  IVF hydration. NPO until GI evaluates.  Pain control provided overnight.    Abdominal pain Pain medication with dilaudid overnight.    Essential hypertension Monitor BP. Has not been on antihypertensives at home. Will provide prn meds if needed. BP may improve with pain control.    Thrombocytosis Mildly elevated platelet count. Recheck CBC in am    S/P gastric bypass    Tobacco abuse Nicotine patch provided to prevent withdrawal    Polysubstance abuse  UDS ordered as pt left AMA and may have taken substance before she returned.      DVT prophylaxis: SCDs for DVT prophylaxis Code Status:   Full code Family Communication:  Diagnosis plan discussed with patient.  Importance of staying for treatment and not signing out AMA is reviewed with patient.  She states she will stay this time.  Further recommendations to follow as clinically  indicated Disposition Plan:   Patient is from:  Home  Anticipated DC to:  Home  Anticipated DC date:  Anticipate 2 midnight or more stay in the hospital  Consults called:  Gastroenterology, Dr. Ellison Carwin chat message sent Admission status:  Inpatient  Yevonne Aline Mariell Nester MD [facility]  How to contact the [facility] Attending or Consulting provider [place] or covering provider during after hours [place], for this patient?  Check the care team in [facility] and look for a) attending/consulting [facility] provider listed and b) the [facility] team listed Log into [email or web address] and use [facility] universal password to access. If you do not have the password, please contact the hospital operator. Locate the [facility] provider you are looking for under [facility] and page to a number that you can be directly reached. If you still have difficulty reaching the provider, please page the [facility] (Director on Call) for the Hospitalists listed on amion for assistance.  01/16/2021, 10:48 PM

## 2021-01-16 NOTE — Progress Notes (Signed)
Patient left AMA, paper signed and in chart. [place] returned to patient. She walked herself to the elevator and was instructed on how to get to the main entrance. IV removed. Patient left in home attire.

## 2021-01-16 NOTE — Progress Notes (Signed)
Patient blood pressure was extremely elevated in pre-procedure and continued to rise. Antihypertensives were given but without notable improvement. Anesthesia decided procedure should be postponed until patient pressures improved and stable.

## 2021-01-16 NOTE — Discharge Summary (Signed)
[facility] Discharge Summary   Patient: Terri [name] [id number]   PCP: Patient, No Pcp Per (Inactive) DOB: 15-May-1976   Date of admission: 01/16/2021   Date of discharge: 01/16/2021    Discharge Disposition: Patient signed out AMA despite being explained the risks of doing so including worsening medical condition as well as death.   Discharge Diagnoses: Severe abdominal pain possibly from gastric ulcer at the site of surgical anastomosis in a patient with gastric bypass Polysubstance abuse Microcytic anemia History of anxiety/bipolar disorder Hypertension Hypokalemia  Discharge Condition: [name]:  45 year old female with history of hypertension, GERD with known gastric ulcer, bipolar disorder, chronic pain, gastric bypass, illicit drug use including cocaine and heroin, recent hospitalization from 01/13/2021-01/14/2021 for abdominal pain, GI was planning to do EGD but patient signed out [place] on 01/14/2021 presented again on 01/14/2021 with continued abdominal pain.  GI was consulted again.  She was started on IV Protonix.  She was able to have EGD done today by GI but she was found to have extremely elevated blood pressure for which anesthesia canceled the procedure for today.  Patient subsequently signed out AMA.  Consultations: GI  The results of significant diagnostics from this hospitalization (including imaging, microbiology, ancillary and laboratory) are listed below for reference.    Significant Diagnostic Studies: CT ABDOMEN PELVIS W CONTRAST  Result Date: 01/13/2021 CLINICAL DATA:  45 year old female with history of abdominal pain shooting up into her chest for 1 week. EXAM: CT ABDOMEN AND PELVIS WITH CONTRAST TECHNIQUE: Multidetector CT imaging of the abdomen and pelvis was performed using the standard protocol following bolus administration of intravenous contrast. CONTRAST:  65m OMNIPAQUE IOHEXOL 350 MG/ML SOLN COMPARISON:  CT the  abdomen and pelvis 02/08/2019. FINDINGS: Lower chest: Scattered areas of mild cylindrical bronchiectasis with some associated peribronchovascular ground-glass attenuation micro nodularity and additional small pulmonary nodules scattered throughout the visualized lung bases, largest of which measures 5 mm in the left lower lobe (axial image 19 of series 6). Areas of scarring and/or subsegmental atelectasis in the lower lobes of the lungs bilaterally. Atherosclerotic calcifications in the thoracic aorta. Hepatobiliary: In the periphery of the right lobe of the liver between segments 5 and 8 (axial image 21 of series 2) there is a small subcapsular lesion measuring 1.2 x 0.5 cm, too small to characterize, but similar to the prior study and statistically likely to represent a cyst. No other larger more suspicious appearing hepatic lesions are noted. Status post cholecystectomy. Minimal intrahepatic biliary ductal dilatation and slight prominence of the common bile duct which measures 1 cm in the porta hepatis likely reflects benign post cholecystectomy physiology. Pancreas: No pancreatic mass. No pancreatic ductal dilatation. No pancreatic or peripancreatic fluid collections or inflammatory changes. Spleen: Spleen is mildly enlarged measuring 10.5 x 4.9 x 14.7 cm (estimated splenic volume of 378 mL). Adrenals/Urinary Tract: Extensive cortical thinning is noted in the left kidney. Right kidney and bilateral adrenal glands are normal in appearance. No hydroureteronephrosis. Small amount of gas non dependently in the lumen of the urinary bladder. Urinary bladder is otherwise normal in appearance. Stomach/Bowel: Postoperative changes of Roux-en-Y gastric bypass. Adjacent to the anastomosis there is a round structure in the left upper quadrant of the abdomen (axial image 25 of series 2) which has a very thickened hazy irregular wall, concerning for an ulcer. It is uncertain whether this arises directly from the anastomotic  suture line or is within the adjacent portions of either the small  bowel or stomach. There is a large amount of surrounding inflammation, very intimately associated with the proximal small bowel, well appreciated anteriorly on axial image 28 of series 2. No well-defined fluid collection is identified in this region. No pathologic dilatation of small bowel or colon. Appendix is not confidently identified may be surgically absent or atrophic. Vascular/Lymphatic: Aortic atherosclerosis, without evidence of aneurysm or dissection in the abdominal or pelvic vasculature. No lymphadenopathy noted in the abdomen or pelvis. Reproductive: Uterus and ovaries are a trophic. Other: Mild diffuse mesenteric edema. Small volume of ascites. No pneumoperitoneum. Musculoskeletal: Mild diffuse body wall edema. Status post laminectomy at L4-L5. Multilevel degenerative disc disease, most severe at L3-L4 where there are advanced discogenic endplate changes. There are no aggressive appearing lytic or blastic lesions noted in the visualized portions of the skeleton. IMPRESSION: 1. Findings are highly concerning for large perianastomotic ulcer in this patient who is status post Roux-en-Y gastric bypass, as detailed above. 2. Anasarca. 3. Compared to the prior examination there is extensive thinning of the cortex of the left kidney, presumably scarring from prior obstruction/infection. 4. Small amount of gas in the nondependent portion of the lumen of the urinary bladder on today's examination. If there has been recent catheterization for urinalysis, this is presumably iatrogenic. In the absence of a recent history of catheterization, further evaluation with urinalysis would be recommended to exclude the possibility of urinary tract infection with gas-forming organisms. 5. Findings in the lung bases which could be seen in the setting of chronic indolent atypical infectious process such as MAI (mycobacterium avium intracellulare). Nonemergent  outpatient referral to Pulmonology for further clinical evaluation is suggested. 6. Mild splenomegaly. 7. Additional incidental findings, as above. Electronically Signed   By: Vinnie Langton M.D.   On: 01/13/2021 12:12    Microbiology: Recent Results (from the past 240 hour(s))  Urine Culture     Status: Abnormal   Collection Time: 01/13/21  9:12 AM   Specimen: Urine, Clean Catch  Result Value Ref Range Status   Specimen Description   Final    URINE, CLEAN CATCH Performed at [facility], [place] [address] [place], [place] [address]    Special Requests   Final    NONE Performed at [facility], [place] [address] [place], [place] [address]    Culture >=100,000 COLONIES/mL ESCHERICHIA COLI (A)  Final   Report Status 01/16/2021 FINAL  Final   Organism ID, Bacteria ESCHERICHIA COLI (A)  Final      Susceptibility   Escherichia coli - MIC*    AMPICILLIN <=2 SENSITIVE Sensitive     CEFAZOLIN <=4 SENSITIVE Sensitive     CEFEPIME <=0.12 SENSITIVE Sensitive     CEFTRIAXONE <=0.25 SENSITIVE Sensitive     CIPROFLOXACIN <=0.25 SENSITIVE Sensitive     GENTAMICIN <=1 SENSITIVE Sensitive     IMIPENEM <=0.25 SENSITIVE Sensitive     NITROFURANTOIN <=16 SENSITIVE Sensitive     TRIMETH/SULFA <=20 SENSITIVE Sensitive     AMPICILLIN/SULBACTAM <=2 SENSITIVE Sensitive     PIP/TAZO <=4 SENSITIVE Sensitive     * >=100,000 COLONIES/mL ESCHERICHIA COLI  SARS CORONAVIRUS 2 (TAT 6-24 HRS) Nasopharyngeal Nasopharyngeal Swab     Status: None   Collection Time: 01/13/21  3:18 PM   Specimen: Nasopharyngeal Swab  Result Value Ref Range Status   SARS Coronavirus 2 NEGATIVE NEGATIVE Final    Comment: (NOTE) SARS-CoV-2 target nucleic acids are NOT DETECTED.  The SARS-CoV-2 RNA is generally detectable in upper and lower  respiratory specimens during the acute phase of infection. Negative results do not preclude SARS-CoV-2 infection, do not rule out co-infections with other  pathogens, and should not be used as the sole basis for treatment or other patient management decisions. Negative results must be combined with clinical observations, patient history, and epidemiological information. The expected result is Negative.  Fact Sheet for Patients: SugarRoll.be  Fact Sheet for Healthcare Providers: https://www.woods-mathews.com/  This test is not yet approved or cleared by the [place] FDA and  has been authorized for detection and/or diagnosis of SARS-CoV-2 by FDA under an Emergency Use Authorization (EUA). This EUA will remain  in effect (meaning this test can be used) for the duration of the COVID-19 declaration under Se ction 564(b)(1) of the Act, 21 U.S.C. section 360bbb-3(b)(1), unless the authorization is terminated or revoked sooner.  Performed at [facility], [place] [address], [place], [place] [address]   Culture, blood (routine x 2)     Status: None (Preliminary result)   Collection Time: 01/13/21  6:50 PM   Specimen: BLOOD  Result Value Ref Range Status   Specimen Description   Final    BLOOD RIGHT ANTECUBITAL Performed at [place] [address], [place], [place] [address]    Special Requests   Final    BOTTLES DRAWN AEROBIC AND ANAEROBIC Blood Culture adequate volume Performed at [place] [address], [place], [place] [address]    Culture   Final    NO GROWTH 3 DAYS Performed at [facility], [place] [address], [place], [place] [address]    Report Status PENDING  Incomplete  MRSA Next Gen by PCR, Nasal     Status: None   Collection Time: 01/13/21  8:54 PM   Specimen: Nasal Mucosa; Nasal Swab  Result Value Ref Range Status   MRSA by PCR Next Gen NOT DETECTED NOT DETECTED Final    Comment: (NOTE) The GeneXpert MRSA Assay (FDA approved for NASAL specimens only), is one component of a comprehensive MRSA colonization  surveillance program. It is not intended to diagnose MRSA infection nor to guide or monitor treatment for MRSA infections. Test performance is not FDA approved in patients less than 75 years old. Performed at [facility], [place] [address], [place], [place] [address]   Resp Panel by RT-PCR (Flu A&B, Covid) Nasopharyngeal Swab     Status: None   Collection Time: 01/14/21  4:01 PM   Specimen: Nasopharyngeal Swab; Nasopharyngeal(NP) swabs in vial transport medium  Result Value Ref Range Status   SARS Coronavirus 2 by RT PCR NEGATIVE NEGATIVE Final    Comment: (NOTE) SARS-CoV-2 target nucleic acids are NOT DETECTED.  The SARS-CoV-2 RNA is generally detectable in upper respiratory specimens during the acute phase of infection. The lowest concentration of SARS-CoV-2 viral copies this assay can detect is 138 copies/mL. A negative result does not preclude SARS-Cov-2 infection and should not be used as the sole basis for treatment or other patient management decisions. A negative result may occur with  improper specimen collection/handling, submission of specimen other than nasopharyngeal swab, presence of viral mutation(s) within the areas targeted by this assay, and inadequate number of viral copies(<138 copies/mL). A negative result must be combined with clinical observations, patient history, and epidemiological information. The expected result is Negative.  Fact Sheet for Patients:  EntrepreneurPulse.com.au  Fact Sheet for Healthcare Providers:  IncredibleEmployment.be  This test is no t yet approved or cleared by the  [place] Architectural technologist and  has been authorized for detection and/or diagnosis of SARS-CoV-2 by FDA under an Print production planner (EUA). This EUA will remain  in effect (meaning this test can be used) for the duration of the COVID-19 declaration under Section 564(b)(1) of the Act, 21 U.S.C.section 360bbb-3(b)(1),  unless the authorization is terminated  or revoked sooner.       Influenza A by PCR NEGATIVE NEGATIVE Final   Influenza B by PCR NEGATIVE NEGATIVE Final    Comment: (NOTE) The Xpert Xpress SARS-CoV-2/FLU/RSV plus assay is intended as an aid in the diagnosis of influenza from Nasopharyngeal swab specimens and should not be used as a sole basis for treatment. Nasal washings and aspirates are unacceptable for Xpert Xpress SARS-CoV-2/FLU/RSV testing.  Fact Sheet for Patients: EntrepreneurPulse.com.au  Fact Sheet for Healthcare Providers: IncredibleEmployment.be  This test is not yet approved or cleared by the [place] FDA and has been authorized for detection and/or diagnosis of SARS-CoV-2 by FDA under an Emergency Use Authorization (EUA). This EUA will remain in effect (meaning this test can be used) for the duration of the COVID-19 declaration under Section 564(b)(1) of the Act, 21 U.S.C. section 360bbb-3(b)(1), unless the authorization is terminated or revoked.  Performed at [facility], [place] [address], [place], [place] [address]      Labs: CBC: Recent Labs  Lab 01/13/21 0349 01/14/21 1254 01/15/21 0409  WBC 7.6 6.6 5.1  NEUTROABS 4.7 3.7  --   HGB 10.6* 12.1 9.6*  HCT 32.5* 37.6 29.6*  MCV 65.1* 65.4* 64.6*  PLT 343 375 [id number]   Basic Metabolic Panel: Recent Labs  Lab 01/13/21 0349 01/14/21 1254 01/15/21 0409  NA 133* 136 136  K 4.0 3.1* 3.8  CL 97* 100 104  CO2 '29 24 27  '$ GLUCOSE 95 89 80  BUN '17 12 14  '$ CREATININE 0.75 0.72 0.65  CALCIUM 8.8* 8.6* 8.4*  MG  --  2.1  --    Liver Function Tests: Recent Labs  Lab 01/13/21 0349 01/14/21 1254 01/15/21 0409  AST '22 24 17  '$ ALT '15 15 12  '$ ALKPHOS 118 121 104  BILITOT 0.4 0.7 0.6  PROT 6.5 6.8 5.5*  ALBUMIN 2.8* 3.0* 2.4*   Recent Labs  Lab 01/13/21 0349  LIPASE 23   No results for input(s): AMMONIA in the last 168 hours. Cardiac  Enzymes: No results for input(s): CKTOTAL, CKMB, CKMBINDEX, TROPONINI in the last 168 hours. BNP (last 3 results) No results for input(s): BNP in the last 8760 hours. CBG: No results for input(s): GLUCAP in the last 168 hours.   Signed:  Aline August  [facility] 01/16/2021, 1:01 PM

## 2021-01-16 NOTE — ED Provider Notes (Signed)
Emergency Medicine Provider Triage Evaluation Note  Terri [name] , a 45 y.o. female  was evaluated in triage.  Pt returns to the emergency department several hours after leaving AMA for a second time during hospital admission for gastric ulcer.  Patient was scheduled to have endoscopy done this morning, but reports her blood pressure was too high and they did not do the procedure so she left because "nothing is getting done".  Patient now returns reporting continued upper abdominal pain  Review of Systems  Positive: Epigastric pain Negative: Fever, hematemesis  Physical Exam  BP 93/68 (BP Location: Left Arm)   Pulse (!) 104   Temp 97.6 F (36.4 C) (Oral)   Resp 18   LMP  (LMP Unknown)   SpO2 100%  Gen:   Awake, no distress, tearful, disheveled Resp:  Normal effort  MSK:   Moves extremities without difficulty  Other:    Medical Decision Making  Medically screening exam initiated at 1:16 PM.  Appropriate orders placed.  [name] was informed that the remainder of the evaluation will be completed by another provider, this initial triage assessment does not replace that evaluation, and the importance of remaining in the ED until their evaluation is complete.     Jacqlyn Larsen, PA-C 01/16/21 1319    Lacretia Leigh, MD 01/18/21 [phone]

## 2021-01-16 NOTE — Anesthesia Preprocedure Evaluation (Deleted)
Anesthesia Evaluation    Reviewed: Allergy & Precautions, Patient's Chart, lab work & pertinent test results  Airway        Dental   Pulmonary Current Smoker and Patient abstained from smoking.,           Cardiovascular hypertension, + DVT    ECG: SR, rate 80   Neuro/Psych  Headaches, PSYCHIATRIC DISORDERS Anxiety Depression Bipolar Disorder    GI/Hepatic PUD, (+)     substance abuse  ,   Endo/Other  negative endocrine ROS  Renal/GU negative Renal ROS     Musculoskeletal negative musculoskeletal ROS (+)   Abdominal   Peds  Hematology  (+) anemia ,   Anesthesia Other Findings Anastomotic ulcer  Reproductive/Obstetrics                             Anesthesia Physical Anesthesia Plan Anesthesia Quick Evaluation

## 2021-01-16 NOTE — ED Triage Notes (Signed)
Pt reports being admitted for gastric ulcer and states she left this morning AMA because "nothing was getting done". Pt is back for abdominal pain.

## 2021-01-16 NOTE — ED Provider Notes (Signed)
[facility] DEPT Provider Note   CSN: [id number] Arrival date & time: 01/16/21  1246     History Chief Complaint  Patient presents with   Abdominal Pain    Terri [name] is a 45 y.o. female who presents to the ED today for continued abdominal pain. Pt left the hospital AGAINST MEDICAL ADVICE earlier today (for the second time this week). She was initially admitted for abdominal pain (08/23-08/24) with plans for EGD by GI however left AMA and presented against on 08/24. She was re-admitted however EGD held today as pt's blood pressure was severely elevated. She decided to leave again today as "nothing was getting done." She returned with continued severe abdominal pain.   Per last progress note by GI: History is obtained by the patient, chart review, with notable details from hospital course, CT, below: Assessment/Plan: 1) Anastomotic ulcer. 2) Epigastric pain. 3) Cocaine abuse. 4) Severe HTN.             The patient will need an EGD, but her HTN needs to be controlled.  In the meantime she needs to be treated with medication for her anastomotic ulcer.  It is visible during this CT scan.  It may be that she will require surgical revision. Plan: 1) PPI BID. 2) Sucralfate. 3) Control HTN. 4) EGD Thursday or Friday pending her blood pressure control.  The history is provided by the patient and medical records.      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   Persistent fever 02/09/2019   COVID-19 virus not detected    Chest pain    Bacteremia due to Escherichia coli    Esophagitis    Hypokalemia    Hypomagnesemia    Sepsis ([place]) 02/02/2019   Leucocytosis 02/02/2019   Thrombocytosis 02/02/2019   UTI (urinary tract infection) 02/02/2019   GIB (gastrointestinal bleeding) 09/13/2017   Apnea 09/13/2017   Essential hypertension    Cellulitis and abscess of neck 08/12/2017   Cellulitis of submandibular region 08/10/2017   Odontogenic infection of jaw 08/10/2017   Cholecystitis 02/14/2017   Opioid abuse with opioid-induced mood disorder ([place]) 01/25/2017   Anxiety 02/11/2014   Depression 07/12/2012    Past Surgical History:  Procedure Laterality Date   ALVEOLOPLASTY Bilateral 01/31/2018   Procedure: ALVEOLOPLASTY;  Surgeon: Diona Browner, DDS;  Location: [facility];  Service: Oral Surgery;  Laterality: Bilateral;   ANKLE SURGERY  Left 1992   with hardware   BACK SURGERY     BIOPSY  02/08/2019   Procedure: BIOPSY;  Surgeon: Rush Landmark Telford Nab., MD;  Location: [facility];  Service: Gastroenterology;;   CESAREAN SECTION     CHOLECYSTECTOMY N/A 02/15/2017   Procedure: LAPAROSCOPIC CHOLECYSTECTOMY;  Surgeon: Clovis Riley, MD;  Location: [facility];  Service: General;  Laterality: N/A;   ENTEROSCOPY N/A 02/08/2019   Procedure: ENTEROSCOPY;  Surgeon: Rush Landmark Telford Nab., MD;  Location: [facility];  Service: Gastroenterology;  Laterality: N/A;   ESOPHAGOGASTRODUODENOSCOPY (EGD) WITH PROPOFOL N/A 09/15/2017   Procedure:  ESOPHAGOGASTRODUODENOSCOPY (EGD) WITH PROPOFOL;  Surgeon: Clarene Essex, MD;  Location: [facility];  Service: Endoscopy;  Laterality: N/A;   ESOPHAGOGASTRODUODENOSCOPY (EGD) WITH PROPOFOL N/A 02/08/2019   Procedure: ESOPHAGOGASTRODUODENOSCOPY (EGD) WITH PROPOFOL;  Surgeon: Rush Landmark Telford Nab., MD;  Location: [facility];  Service: Gastroenterology;  Laterality: N/A;   GASTRIC BYPASS     INCISION AND DRAINAGE OF PERITONSILLAR ABCESS Left 08/13/2017   Procedure: INCISION AND DRAINAGE OF LEFT NECK ABSCESS;  Surgeon: Melida Quitter, MD;  Location: [facility];  Service: ENT;  Laterality: Left;   LAPAROSCOPIC LYSIS OF ADHESIONS  02/15/2017   Procedure: LAPAROSCOPIC LYSIS OF ADHESIONS;  Surgeon: Clovis Riley, MD;  Location: [facility];  Service: General;;   LUMBAR DISC SURGERY     LUMBAR FUSION     SAVORY DILATION N/A 02/08/2019   Procedure: SAVORY DILATION;  Surgeon: Irving Copas., MD;  Location: [facility];  Service: Gastroenterology;  Laterality: N/A;   TOOTH EXTRACTION Bilateral 01/31/2018   Procedure: DENTAL RESTORATION/EXTRACTIONS;  Surgeon: Diona Browner, DDS;  Location: [facility];  Service: Oral Surgery;  Laterality: Bilateral;   TUBAL LIGATION     UPPER GI ENDOSCOPY N/A 02/15/2017   Procedure: UPPER GI ENDOSCOPY;  Surgeon: Clovis Riley, MD;  Location: [facility];  Service: General;  Laterality: N/A;     OB History   No obstetric history on file.     Family History  Problem Relation Age of Onset   Diabetes Mother    Hypertension Mother    Diabetes Father    Hypertension Father     Social History   Tobacco Use   Smoking status: Every Day    Packs/day: 1.00    Years: 29.00    Pack years: 29.00    Types: Cigarettes   Smokeless tobacco: Never  Vaping Use   Vaping Use: Never used  Substance Use Topics   Alcohol use: No   Drug use: Yes    Types: IV, Cocaine    Home Medications Prior to Admission medications   Medication Sig Start Date End Date Taking? Authorizing  Provider  acetaminophen (TYLENOL) 500 MG tablet Take 1 tablet (500 mg total) by mouth every 6 (six) hours as needed for moderate pain. Patient not taking: No sig reported 01/31/18   Diona Browner, DMD  ibuprofen (ADVIL) 200 MG tablet Take 800 mg by mouth every 6 (six) hours as needed for moderate pain. Patient not taking: Reported on 01/14/2021    [provider]    Allergies    Ketorolac tromethamine and Methocarbamol  Review of Systems   Review of Systems  Constitutional:  Negative for fever.  Gastrointestinal:  Positive for abdominal pain and nausea.  All other systems reviewed and are negative.  Physical Exam Updated Vital Signs BP (!) 133/97   Pulse 77   Temp 97.6 F (36.4 C) (Oral)   Resp 18   LMP  (LMP  Unknown)   SpO2 100%   Physical Exam Vitals and nursing note reviewed.  Constitutional:      Appearance: She is not ill-appearing.     Comments: Thin cachectic appearing female; disheveled  HENT:     Head: Normocephalic and atraumatic.  Eyes:     Conjunctiva/sclera: Conjunctivae normal.  Cardiovascular:     Rate and Rhythm: Normal rate and regular rhythm.  Pulmonary:     Effort: Pulmonary effort is normal.     Breath sounds: Normal breath sounds.  Abdominal:     Palpations: Abdomen is soft.     Tenderness: There is generalized abdominal tenderness. There is no guarding or rebound.  Musculoskeletal:     Cervical back: Neck supple.  Skin:    General: Skin is warm and dry.  Neurological:     Mental Status: She is alert.    ED Results / Procedures / Treatments   Labs (all labs ordered are listed, but only abnormal results are displayed) Labs Reviewed  CBC WITH DIFFERENTIAL/PLATELET - Abnormal; Notable for the following components:      Result Value   WBC 10.7 (*)    RBC 5.55 (*)    Hemoglobin 11.8 (*)    MCV 67.0 (*)    MCH 21.3 (*)    RDW 21.8 (*)    Platelets 417 (*)    All other components within normal limits  RESP PANEL BY RT-PCR (FLU A&B,  COVID) ARPGX2  COMPREHENSIVE METABOLIC PANEL  LIPASE, BLOOD    EKG None  Radiology No results found.  Procedures Procedures   Medications Ordered in ED Medications  sucralfate (CARAFATE) tablet 1 g (1 g Oral Given 01/16/21 1922)  famotidine (PEPCID) IVPB 20 mg premix (20 mg Intravenous New Bag/Given 01/16/21 2144)  ondansetron (ZOFRAN) injection 4 mg (4 mg Intravenous Given 01/16/21 2144)  HYDROmorphone (DILAUDID) injection 1 mg (1 mg Intramuscular Given 01/16/21 2055)    ED Course  I have reviewed the triage vital signs and the nursing notes.  Pertinent labs & imaging results that were available during my care of the patient were reviewed by me and considered in my medical decision making (see chart for details).    MDM Rules/Calculators/A&P                           45 year old female who presents to the ED today after leaving the hospital [facility] for the second time in the past couple of days secondary to admission for abdominal pain with plans for EGD secondary to gastric ulcer relating to pain.   On arrival to the ED today patient is afebrile, nontachycardic and nontachypneic and appears to be no acute distress.  Her blood pressure was initially soft at 93/68 with repeat 140/92.  It does appear she was having issues with high blood pressure while in the hospital which is why her EGD was delayed.  Patient is adamant that she will not leave [facility] again and wants to be readmitted.  We will plan for repeat labs and hospitalist admission.  CBC without leukocytosis and stable hgb 11.8 CMP unfortunately hemolyzed; will re-collect  Discussed case with [facility] Dr. Tonie Griffith who will come evaluate patient; recommend repeat CT scan at this time as well as lipase. Will add on.   Final Clinical Impression(s) / ED Diagnoses Final diagnoses:  Epigastric pain    Rx / DC Orders ED Discharge Orders     None  Eustaquio Maize,  PA-C 01/16/21 2351    Arnaldo Natal, MD 01/17/21 [phone]

## 2021-01-17 ENCOUNTER — Inpatient Hospital Stay (HOSPITAL_COMMUNITY): Payer: Medicaid Other

## 2021-01-17 ENCOUNTER — Inpatient Hospital Stay (HOSPITAL_COMMUNITY): Payer: Medicaid Other | Admitting: Anesthesiology

## 2021-01-17 ENCOUNTER — Encounter (HOSPITAL_COMMUNITY): Payer: Self-pay | Admitting: Family Medicine

## 2021-01-17 ENCOUNTER — Encounter (HOSPITAL_COMMUNITY)
Admission: EM | Discharge status: Against Medical Advice | Payer: Self-pay | Source: Home / Self Care | Attending: Internal Medicine

## 2021-01-17 DIAGNOSIS — F1910 Other psychoactive substance abuse, uncomplicated: Secondary | ICD-10-CM

## 2021-01-17 HISTORY — PX: ESOPHAGOGASTRODUODENOSCOPY: SHX5428

## 2021-01-17 LAB — BASIC METABOLIC PANEL
Anion gap: 6 (ref 5–15)
BUN: 14 mg/dL (ref 6–20)
CO2: 25 mmol/L (ref 22–32)
Calcium: 8.6 mg/dL — ABNORMAL LOW (ref 8.9–10.3)
Chloride: 108 mmol/L (ref 98–111)
Creatinine, Ser: 0.82 mg/dL (ref 0.44–1.00)
GFR, Estimated: >60 mL/min (ref >60)
Glucose, Bld: 76 mg/dL (ref 70–99)
Potassium: 3.9 mmol/L (ref 3.5–5.1)
Sodium: 139 mmol/L (ref 135–145)

## 2021-01-17 LAB — RAPID URINE DRUG SCREEN, HOSP PERFORMED
Amphetamines: POSITIVE — AB
Barbiturates: NOT DETECTED
Benzodiazepines: POSITIVE — AB
Cocaine: POSITIVE — AB
Opiates: POSITIVE — AB
Tetrahydrocannabinol: NOT DETECTED

## 2021-01-17 LAB — CBC
HCT: 30.2 % — ABNORMAL LOW (ref 36.0–46.0)
Hemoglobin: 9.6 g/dL — ABNORMAL LOW (ref 12.0–15.0)
MCH: 21.2 pg — ABNORMAL LOW (ref 26.0–34.0)
MCHC: 31.8 g/dL (ref 30.0–36.0)
MCV: 66.7 fL — ABNORMAL LOW (ref 80.0–100.0)
Platelets: 301 10*3/uL (ref 150–400)
RBC: 4.53 MIL/uL (ref 3.87–5.11)
RDW: 21.1 % — ABNORMAL HIGH (ref 11.5–15.5)
WBC: 5.9 10*3/uL (ref 4.0–10.5)
nRBC: 0 % (ref 0.0–0.2)

## 2021-01-17 SURGERY — EGD (ESOPHAGOGASTRODUODENOSCOPY)
Anesthesia: Monitor Anesthesia Care

## 2021-01-17 MED ORDER — SUCRALFATE 1 G PO TABS
1.0000 g | ORAL_TABLET | Freq: Three times a day (TID) | ORAL | Status: DC
  Administered 2021-01-17 (×3): 1 g via ORAL
  Filled 2021-01-17 (×5): qty 1

## 2021-01-17 MED ORDER — HYDROMORPHONE HCL 1 MG/ML IJ SOLN
1.0000 mg | INTRAMUSCULAR | Status: DC | PRN
  Administered 2021-01-17 (×2): 1 mg via INTRAVENOUS
  Filled 2021-01-17 (×2): qty 1

## 2021-01-17 MED ORDER — CLONIDINE HCL 0.1 MG PO TABS
0.1000 mg | ORAL_TABLET | Freq: Four times a day (QID) | ORAL | Status: DC
  Administered 2021-01-17: 0.1 mg via ORAL
  Filled 2021-01-17 (×2): qty 1

## 2021-01-17 MED ORDER — CLONIDINE HCL 0.1 MG PO TABS: 0.1000 mg | ORAL_TABLET | Freq: Every day | ORAL | Status: DC

## 2021-01-17 MED ORDER — HYDRALAZINE HCL 20 MG/ML IJ SOLN
5.0000 mg | INTRAMUSCULAR | Status: DC | PRN
  Administered 2021-01-17: 5 mg via INTRAVENOUS
  Filled 2021-01-17: qty 1

## 2021-01-17 MED ORDER — DICYCLOMINE HCL 20 MG PO TABS: 20.0000 mg | ORAL_TABLET | Freq: Four times a day (QID) | ORAL | Status: DC | PRN

## 2021-01-17 MED ORDER — ACETAMINOPHEN 325 MG PO TABS
650.0000 mg | ORAL_TABLET | Freq: Four times a day (QID) | ORAL | Status: DC | PRN
  Administered 2021-01-17: 650 mg via ORAL
  Filled 2021-01-17: qty 2

## 2021-01-17 MED ORDER — LOPERAMIDE HCL 2 MG PO CAPS: 2.0000 mg | ORAL_CAPSULE | ORAL | Status: DC | PRN

## 2021-01-17 MED ORDER — IOHEXOL 350 MG/ML SOLN
80.0000 mL | Freq: Once | INTRAVENOUS | Status: AC | PRN
  Administered 2021-01-17: 80 mL via INTRAVENOUS

## 2021-01-17 MED ORDER — HYDROXYZINE HCL 25 MG PO TABS
25.0000 mg | ORAL_TABLET | Freq: Four times a day (QID) | ORAL | Status: DC | PRN
  Administered 2021-01-17: 25 mg via ORAL
  Filled 2021-01-17: qty 1

## 2021-01-17 MED ORDER — ONDANSETRON HCL 4 MG/2ML IJ SOLN
4.0000 mg | Freq: Four times a day (QID) | INTRAMUSCULAR | Status: DC | PRN
  Administered 2021-01-18: 4 mg via INTRAVENOUS
  Filled 2021-01-17: qty 2

## 2021-01-17 MED ORDER — PANTOPRAZOLE SODIUM 40 MG IV SOLR
40.0000 mg | Freq: Two times a day (BID) | INTRAVENOUS | Status: DC
  Administered 2021-01-17 (×3): 40 mg via INTRAVENOUS
  Filled 2021-01-17 (×4): qty 40

## 2021-01-17 MED ORDER — PROPOFOL 500 MG/50ML IV EMUL
INTRAVENOUS | Status: DC | PRN
  Administered 2021-01-17: 150 ug/kg/min via INTRAVENOUS

## 2021-01-17 MED ORDER — LIDOCAINE 2% (20 MG/ML) 5 ML SYRINGE
INTRAMUSCULAR | Status: DC | PRN
  Administered 2021-01-17: 100 mg via INTRAVENOUS

## 2021-01-17 MED ORDER — ACETAMINOPHEN 650 MG RE SUPP: 650.0000 mg | Freq: Four times a day (QID) | RECTAL | Status: DC | PRN

## 2021-01-17 MED ORDER — NICOTINE 14 MG/24HR TD PT24
14.0000 mg | MEDICATED_PATCH | Freq: Every day | TRANSDERMAL | Status: DC
  Administered 2021-01-17: 14 mg via TRANSDERMAL
  Filled 2021-01-17 (×2): qty 1

## 2021-01-17 MED ORDER — LACTATED RINGERS IV SOLN: INTRAVENOUS | Status: DC

## 2021-01-17 MED ORDER — HYDROMORPHONE HCL 1 MG/ML IJ SOLN
1.0000 mg | INTRAMUSCULAR | Status: DC | PRN
  Administered 2021-01-17 – 2021-01-18 (×7): 1 mg via INTRAVENOUS
  Filled 2021-01-17 (×7): qty 1

## 2021-01-17 MED ORDER — METHOCARBAMOL 500 MG PO TABS
500.0000 mg | ORAL_TABLET | Freq: Three times a day (TID) | ORAL | Status: DC | PRN
  Administered 2021-01-17 – 2021-01-18 (×2): 500 mg via ORAL
  Filled 2021-01-17 (×2): qty 1

## 2021-01-17 MED ORDER — CLONIDINE HCL 0.1 MG PO TABS: 0.1000 mg | ORAL_TABLET | ORAL | Status: DC

## 2021-01-17 MED ORDER — ONDANSETRON HCL 4 MG PO TABS: 4.0000 mg | ORAL_TABLET | Freq: Four times a day (QID) | ORAL | Status: DC | PRN

## 2021-01-17 MED ORDER — PROPOFOL 10 MG/ML IV BOLUS
INTRAVENOUS | Status: DC | PRN
  Administered 2021-01-17: 50 mg via INTRAVENOUS
  Administered 2021-01-17: 30 mg via INTRAVENOUS

## 2021-01-17 NOTE — Transfer of Care (Signed)
Immediate Anesthesia Transfer of Care Note  Patient: Terri [name]  Procedure(s) Performed: ESOPHAGOGASTRODUODENOSCOPY (EGD)  Patient Location: Endoscopy Unit  Anesthesia Type:MAC  Level of Consciousness: drowsy  Airway & Oxygen Therapy: Patient Spontanous Breathing and Patient connected to face mask oxygen  Post-op Assessment: Report given to RN and Post -op Vital signs reviewed and stable  Post vital signs: Reviewed and stable  Last Vitals:  Vitals Value Taken Time  BP    Temp    Pulse    Resp    SpO2      Last Pain:  Vitals:   01/17/21 1147  TempSrc: Temporal  PainSc: 9       Patients Stated Pain Goal: 2 (29/56/21 3086)  Complications: No notable events documented.

## 2021-01-17 NOTE — Anesthesia Postprocedure Evaluation (Signed)
Anesthesia Post Note  Patient: Terri [name]  Procedure(s) Performed: ESOPHAGOGASTRODUODENOSCOPY (EGD)     Patient location during evaluation: Endoscopy Anesthesia Type: MAC Level of consciousness: awake and alert Pain management: pain level controlled Vital Signs Assessment: post-procedure vital signs reviewed and stable Respiratory status: spontaneous breathing, nonlabored ventilation, respiratory function stable and patient connected to nasal cannula oxygen Cardiovascular status: blood pressure returned to baseline and stable Postop Assessment: no apparent nausea or vomiting Anesthetic complications: no   No notable events documented.  Last Vitals:  Vitals:   01/17/21 1220 01/17/21 1230  BP: (!) 133/99 (!) 175/100  Pulse: 65 (!) 58  Resp: 13 11  Temp:    SpO2: 98% 98%    Last Pain:  Vitals:   01/17/21 1230  TempSrc:   PainSc: 0-No pain                 Breawna Montenegro DANIEL

## 2021-01-17 NOTE — Progress Notes (Signed)
Patient ID: Terri [name], female   DOB: April 06, 1976, 45 y.o.   MRN: [id number]  PROGRESS NOTE    Terri [name]  [id number] DOB: [birth year] DOA: 01/16/2021 PCP: Patient, No Pcp Per (Inactive)   Brief Narrative:  45 year old female with history of hypertension, GERD with known gastric ulcer, bipolar disorder, chronic pain, gastric bypass, illicit drug use including cocaine and heroin, recent hospitalization from 01/13/2021-01/14/2021 for abdominal pain, GI was planning to do EGD but patient signed out [facility] on 01/14/2021 followed by another admission from 01/14/2021-01/16/2021 with GI planning for EGD on 01/16/2021 but procedure was canceled because of high blood pressure and subsequently patient signed out [facility] again on 01/16/2021.  She presented again on the same day on 01/16/2021 with abdominal pain and was subsequently admitted.  GI was consulted.  She was started on IV Protonix.  Assessment & Plan:   Severe abdominal pain possibly from gastric ulcer at the site of surgical anastomosis in a patient with gastric bypass -GI was planning to do EGD but patient had signed out [facility] on 01/14/2021. GI planned for EGD on 01/16/2021 but procedure was canceled because of high blood pressure and subsequently patient signed out [facility] again on 01/16/2021.   -CT of the abdomen and pelvis repeated again on 01/16/2021 showed possible perianastomotic ulcer.  Lipase was normal. -GI following and planning for EGD today.   -Continue IV Protonix along with oral Carafate. -Complains of severe pain despite currently being on IV Dilaudid.  Will not escalate dose of IV Dilaudid as patient has chronic pain and probably in medication seeking behavior.   Polysubstance abuse -Urine drug screen positive for amphetamines, benzodiazepines, opiates and cocaine. -Counseled regarding cessation.  Social worker consult for the same. -Start clonidine withdrawal protocol.    Microcytic anemia -Hemoglobin currently stable.  Monitor   History of anxiety/bipolar disorder -Will need outpatient follow-up with psychiatry.   Hypertension -Blood pressure elevated.  IV hydralazine as needed.  Hypokalemia -Improved   DVT prophylaxis: SCDs Code Status: Full Family Communication: None at bedside Disposition Plan: Status is: Inpatient  Remains inpatient appropriate because:Inpatient level of care appropriate due to severity of illness  Dispo: The patient is from: Home              Anticipated d/c is to: Home              Patient currently is not medically stable to d/c.   Difficult to place patient No   Consultants: GI  Procedures: None  Antimicrobials: None   Subjective: Patient seen and examined at bedside.  Poor historian.  Wakes up slightly, asks for more frequent Dilaudid and then goes back to sleep.  No overnight fever or vomiting reported.  Objective: Vitals:   01/17/21 0108 01/17/21 0135 01/17/21 0532 01/17/21 0915  BP: (!) 138/95 (!) 124/109 (!) 155/96 (!) 158/96  Pulse:  (!) 56 70 (!) 58  Resp:  16 16   Temp:  97.8 F (36.6 C) (!) 97.5 F (36.4 C) 98.7 F (37.1 C)  TempSrc:  Oral Oral Oral  SpO2:  93% 97% 94%    Intake/Output Summary (Last 24 hours) at 01/17/2021 1131 Last data filed at 01/17/2021 0956 Gross per 24 hour  Intake 369.32 ml  Output 500 ml  Net -130.68 ml   There were no vitals filed for this visit.  Examination:  General exam: Appears calm and comfortable.  Chronically ill looking female, looks very deconditioned and  thinly built.  On room air currently. Respiratory system: Bilateral decreased breath sounds at bases Cardiovascular system: S1 & S2 heard, intermittently bradycardic gastrointestinal system: Abdomen is nondistended, soft and mildly tender in the epigastric region.  Normal bowel sounds heard. Extremities: No cyanosis, clubbing, edema  Central nervous system: Sleepy, wakes up slightly, slow to  respond falling back to sleep again.  No focal neurological deficits. Moving extremities Skin: No rashes, lesions or ulcers Psychiatry: Affect is mostly flat; intermittently looks anxious.   Data Reviewed: I have personally reviewed following labs and imaging studies  CBC: Recent Labs  Lab 01/13/21 0349 01/14/21 1254 01/15/21 0409 01/16/21 1940 01/17/21 0521  WBC 7.6 6.6 5.1 10.7* 5.9  NEUTROABS 4.7 3.7  --  6.1  --   HGB 10.6* 12.1 9.6* 11.8* 9.6*  HCT 32.5* 37.6 29.6* 37.2 30.2*  MCV 65.1* 65.4* 64.6* 67.0* 66.7*  PLT 343 375 302 417* [id number]   Basic Metabolic Panel: Recent Labs  Lab 01/13/21 0349 01/14/21 1254 01/15/21 0409 01/16/21 2200 01/17/21 0521  NA 133* 136 136 135 139  K 4.0 3.1* 3.8 3.3* 3.9  CL 97* 100 104 103 108  CO2 '29 24 27 26 25  '$ GLUCOSE 95 89 80 83 76  BUN '17 12 14 15 14  '$ CREATININE 0.75 0.72 0.65 1.01* 0.82  CALCIUM 8.8* 8.6* 8.4* 8.5* 8.6*  MG  --  2.1  --   --   --    GFR: Estimated Creatinine Clearance: 85 mL/min (by C-G formula based on SCr of 0.82 mg/dL). Liver Function Tests: Recent Labs  Lab 01/13/21 0349 01/14/21 1254 01/15/21 0409 01/16/21 2200  AST '22 24 17 19  '$ ALT '15 15 12 13  '$ ALKPHOS 118 121 104 97  BILITOT 0.4 0.7 0.6 0.3  PROT 6.5 6.8 5.5* 6.0*  ALBUMIN 2.8* 3.0* 2.4* 2.6*   Recent Labs  Lab 01/13/21 0349 01/16/21 2200  LIPASE 23 37   No results for input(s): AMMONIA in the last 168 hours. Coagulation Profile: No results for input(s): INR, PROTIME in the last 168 hours. Cardiac Enzymes: No results for input(s): CKTOTAL, CKMB, CKMBINDEX, TROPONINI in the last 168 hours. BNP (last 3 results) No results for input(s): PROBNP in the last 8760 hours. HbA1C: No results for input(s): HGBA1C in the last 72 hours. CBG: No results for input(s): GLUCAP in the last 168 hours. Lipid Profile: No results for input(s): CHOL, HDL, LDLCALC, TRIG, CHOLHDL, LDLDIRECT in the last 72 hours. Thyroid Function Tests: No results for  input(s): TSH, T4TOTAL, FREET4, T3FREE, THYROIDAB in the last 72 hours. Anemia Panel: No results for input(s): VITAMINB12, FOLATE, FERRITIN, TIBC, IRON, RETICCTPCT in the last 72 hours. Sepsis Labs: Recent Labs  Lab 01/13/21 1853 01/13/21 2102  LATICACIDVEN 1.5 1.3    Recent Results (from the past 240 hour(s))  Urine Culture     Status: Abnormal   Collection Time: 01/13/21  9:12 AM   Specimen: Urine, Clean Catch  Result Value Ref Range Status   Specimen Description   Final    URINE, CLEAN CATCH Performed at [facility], [place] [address] [place], [place] [address]    Special Requests   Final    NONE Performed at [facility], [place] [address] [place], [place] [address]    Culture >=100,000 COLONIES/mL ESCHERICHIA COLI (A)  Final   Report Status 01/16/2021 FINAL  Final   Organism ID, Bacteria ESCHERICHIA COLI (A)  Final      Susceptibility  Escherichia coli - MIC*    AMPICILLIN <=2 SENSITIVE Sensitive     CEFAZOLIN <=4 SENSITIVE Sensitive     CEFEPIME <=0.12 SENSITIVE Sensitive     CEFTRIAXONE <=0.25 SENSITIVE Sensitive     CIPROFLOXACIN <=0.25 SENSITIVE Sensitive     GENTAMICIN <=1 SENSITIVE Sensitive     IMIPENEM <=0.25 SENSITIVE Sensitive     NITROFURANTOIN <=16 SENSITIVE Sensitive     TRIMETH/SULFA <=20 SENSITIVE Sensitive     AMPICILLIN/SULBACTAM <=2 SENSITIVE Sensitive     PIP/TAZO <=4 SENSITIVE Sensitive     * >=100,000 COLONIES/mL ESCHERICHIA COLI  SARS CORONAVIRUS 2 (TAT 6-24 HRS) Nasopharyngeal Nasopharyngeal Swab     Status: None   Collection Time: 01/13/21  3:18 PM   Specimen: Nasopharyngeal Swab  Result Value Ref Range Status   SARS Coronavirus 2 NEGATIVE NEGATIVE Final    Comment: (NOTE) SARS-CoV-2 target nucleic acids are NOT DETECTED.  The SARS-CoV-2 RNA is generally detectable in upper and lower respiratory specimens during the acute phase of infection. Negative results do not preclude SARS-CoV-2 infection, do not  rule out co-infections with other pathogens, and should not be used as the sole basis for treatment or other patient management decisions. Negative results must be combined with clinical observations, patient history, and epidemiological information. The expected result is Negative.  Fact Sheet for Patients: SugarRoll.be  Fact Sheet for Healthcare Providers: https://www.woods-mathews.com/  This test is not yet approved or cleared by the [place] FDA and  has been authorized for detection and/or diagnosis of SARS-CoV-2 by FDA under an Emergency Use Authorization (EUA). This EUA will remain  in effect (meaning this test can be used) for the duration of the COVID-19 declaration under Se ction 564(b)(1) of the Act, 21 U.S.C. section 360bbb-3(b)(1), unless the authorization is terminated or revoked sooner.  Performed at [facility], [place] [address] [place], [place] [address]   Culture, blood (routine x 2)     Status: None (Preliminary result)   Collection Time: 01/13/21  6:50 PM   Specimen: BLOOD  Result Value Ref Range Status   Specimen Description   Final    BLOOD RIGHT ANTECUBITAL Performed at [place] [address], [place], [place] [address]    Special Requests   Final    BOTTLES DRAWN AEROBIC AND ANAEROBIC Blood Culture adequate volume Performed at [place] [address] [place], [place] [address]    Culture   Final    NO GROWTH 3 DAYS Performed at [facility], [place] [address], [place], [place] [address]    Report Status PENDING  Incomplete  MRSA Next Gen by PCR, Nasal     Status: None   Collection Time: 01/13/21  8:54 PM   Specimen: Nasal Mucosa; Nasal Swab  Result Value Ref Range Status   MRSA by PCR Next Gen NOT DETECTED NOT DETECTED Final    Comment: (NOTE) The GeneXpert MRSA Assay (FDA approved for NASAL specimens only), is one component of a comprehensive  MRSA colonization surveillance program. It is not intended to diagnose MRSA infection nor to guide or monitor treatment for MRSA infections. Test performance is not FDA approved in patients less than 54 years old. Performed at [facility], [place] [address] [place], [place] [address]   Resp Panel by RT-PCR (Flu A&B, Covid) Nasopharyngeal Swab     Status: None   Collection Time: 01/14/21  4:01 PM   Specimen: Nasopharyngeal Swab; Nasopharyngeal(NP) swabs in vial transport  medium  Result Value Ref Range Status   SARS Coronavirus 2 by RT PCR NEGATIVE NEGATIVE Final    Comment: (NOTE) SARS-CoV-2 target nucleic acids are NOT DETECTED.  The SARS-CoV-2 RNA is generally detectable in upper respiratory specimens during the acute phase of infection. The lowest concentration of SARS-CoV-2 viral copies this assay can detect is 138 copies/mL. A negative result does not preclude SARS-Cov-2 infection and should not be used as the sole basis for treatment or other patient management decisions. A negative result may occur with  improper specimen collection/handling, submission of specimen other than nasopharyngeal swab, presence of viral mutation(s) within the areas targeted by this assay, and inadequate number of viral copies(<138 copies/mL). A negative result must be combined with clinical observations, patient history, and epidemiological information. The expected result is Negative.  Fact Sheet for Patients:  EntrepreneurPulse.com.au  Fact Sheet for Healthcare Providers:  IncredibleEmployment.be  This test is no t yet approved or cleared by the [place] FDA and  has been authorized for detection and/or diagnosis of SARS-CoV-2 by FDA under an Emergency Use Authorization (EUA). This EUA will remain  in effect (meaning this test can be used) for the duration of the COVID-19 declaration under Section 564(b)(1) of the Act, 21 U.S.C.section  360bbb-3(b)(1), unless the authorization is terminated  or revoked sooner.       Influenza A by PCR NEGATIVE NEGATIVE Final   Influenza B by PCR NEGATIVE NEGATIVE Final    Comment: (NOTE) The Xpert Xpress SARS-CoV-2/FLU/RSV plus assay is intended as an aid in the diagnosis of influenza from Nasopharyngeal swab specimens and should not be used as a sole basis for treatment. Nasal washings and aspirates are unacceptable for Xpert Xpress SARS-CoV-2/FLU/RSV testing.  Fact Sheet for Patients: EntrepreneurPulse.com.au  Fact Sheet for Healthcare Providers: IncredibleEmployment.be  This test is not yet approved or cleared by the [place] FDA and has been authorized for detection and/or diagnosis of SARS-CoV-2 by FDA under an Emergency Use Authorization (EUA). This EUA will remain in effect (meaning this test can be used) for the duration of the COVID-19 declaration under Section 564(b)(1) of the Act, 21 U.S.C. section 360bbb-3(b)(1), unless the authorization is terminated or revoked.  Performed at [facility], [place] [address], [place], [place] [address]   Resp Panel by RT-PCR (Flu A&B, Covid) Nasopharyngeal Swab     Status: None   Collection Time: 01/16/21 10:26 PM   Specimen: Nasopharyngeal Swab; Nasopharyngeal(NP) swabs in vial transport medium  Result Value Ref Range Status   SARS Coronavirus 2 by RT PCR NEGATIVE NEGATIVE Final    Comment: (NOTE) SARS-CoV-2 target nucleic acids are NOT DETECTED.  The SARS-CoV-2 RNA is generally detectable in upper respiratory specimens during the acute phase of infection. The lowest concentration of SARS-CoV-2 viral copies this assay can detect is 138 copies/mL. A negative result does not preclude SARS-Cov-2 infection and should not be used as the sole basis for treatment or other patient management decisions. A negative result may occur with  improper specimen collection/handling,  submission of specimen other than nasopharyngeal swab, presence of viral mutation(s) within the areas targeted by this assay, and inadequate number of viral copies(<138 copies/mL). A negative result must be combined with clinical observations, patient history, and epidemiological information. The expected result is Negative.  Fact Sheet for Patients:  EntrepreneurPulse.com.au  Fact Sheet for Healthcare Providers:  IncredibleEmployment.be  This test is no t yet approved or cleared by the [place] and  has been authorized  for detection and/or diagnosis of SARS-CoV-2 by FDA under an Emergency Use Authorization (EUA). This EUA will remain  in effect (meaning this test can be used) for the duration of the COVID-19 declaration under Section 564(b)(1) of the Act, 21 U.S.C.section 360bbb-3(b)(1), unless the authorization is terminated  or revoked sooner.       Influenza A by PCR NEGATIVE NEGATIVE Final   Influenza B by PCR NEGATIVE NEGATIVE Final    Comment: (NOTE) The Xpert Xpress SARS-CoV-2/FLU/RSV plus assay is intended as an aid in the diagnosis of influenza from Nasopharyngeal swab specimens and should not be used as a sole basis for treatment. Nasal washings and aspirates are unacceptable for Xpert Xpress SARS-CoV-2/FLU/RSV testing.  Fact Sheet for Patients: EntrepreneurPulse.com.au  Fact Sheet for Healthcare Providers: IncredibleEmployment.be  This test is not yet approved or cleared by the [place] FDA and has been authorized for detection and/or diagnosis of SARS-CoV-2 by FDA under an Emergency Use Authorization (EUA). This EUA will remain in effect (meaning this test can be used) for the duration of the COVID-19 declaration under Section 564(b)(1) of the Act, 21 U.S.C. section 360bbb-3(b)(1), unless the authorization is terminated or revoked.  Performed at [facility], [place] [address], [place], [place] [address]          Radiology Studies: CT Abdomen Pelvis W Contrast  Result Date: 01/17/2021 CLINICAL DATA:  Abdominal pain. EXAM: CT ABDOMEN AND PELVIS WITH CONTRAST TECHNIQUE: Multidetector CT imaging of the abdomen and pelvis was performed using the standard protocol following bolus administration of intravenous contrast. CONTRAST:  26m OMNIPAQUE IOHEXOL 350 MG/ML SOLN COMPARISON:  January 13, 2021 FINDINGS: Lower chest: Mild atelectasis is seen within the posterior aspect of the right lung base. Hepatobiliary: A 1.1 cm x 0.6 cm cyst is seen within the lateral aspect of the right lobe of the liver. Status post cholecystectomy. The common bile duct measures approximately 1.0 cm. Pancreas: Unremarkable. No pancreatic ductal dilatation or surrounding inflammatory changes. Spleen: The spleen is mildly enlarged and otherwise normal in appearance. Adrenals/Urinary Tract: Adrenal glands are unremarkable. Kidneys are normal in size, without renal calculi or hydronephrosis. Renal cortical thinning is noted on the left. Bladder is unremarkable. Stomach/Bowel: Surgical sutures are seen within the gastric region consistent with Roux-en-Y gastric bypass. A rounded structure is again noted within the left upper quadrant, adjacent to the anastomosis, which demonstrates a thickened wall and adjacent inflammation (axial CT images 18 through 23, CT series 2). This finding is stable in appearance when compared to the prior study. Surgically anastomosed bowel is also seen within the anterior aspect of the left lower quadrant. Appendix is not identified. No evidence of bowel dilatation. Vascular/Lymphatic: Aortic atherosclerosis. No enlarged abdominal or pelvic lymph nodes. Reproductive: Uterus and bilateral adnexa are unremarkable. Other: No abdominal wall hernia or abnormality. No abdominopelvic ascites. Musculoskeletal: Degenerative changes are seen within the lumbar spine,  most prominent at the levels of L3-L4 and L4-L5. IMPRESSION: 1. Stable findings suspicious for a perianastomotic ulcer, status post recent Roux-en-Y gastric bypass. 2. Evidence of prior cholecystectomy. 3. Mild splenomegaly. Electronically Signed   By: TVirgina NorfolkM.D.   On: 01/17/2021 01:23        Scheduled Meds:  nicotine  14 mg Transdermal Daily   pantoprazole (PROTONIX) IV  40 mg Intravenous Q12H   sucralfate  1 g Oral TID WC & HS   Continuous Infusions:  lactated ringers 100 mL/hr at 01/17/21 0146  Aline August, MD [facility] 01/17/2021, 11:31 AM

## 2021-01-17 NOTE — Anesthesia Preprocedure Evaluation (Addendum)
Anesthesia Evaluation  Patient identified by MRN, date of birth, ID band Patient awake    Reviewed: Allergy & Precautions, NPO status , Patient's Chart, lab work & pertinent test results  History of Anesthesia Complications Negative for: history of anesthetic complications  Airway Mallampati: I  TM Distance: >3 FB Neck ROM: Full    Dental  (+) Edentulous Upper, Edentulous Lower, Dental Advisory Given   Pulmonary Current Smoker and Patient abstained from smoking.,    Pulmonary exam normal        Cardiovascular hypertension, Pt. on medications + DVT  Normal cardiovascular exam  ECG: SR, rate 80   Neuro/Psych  Headaches, PSYCHIATRIC DISORDERS Anxiety Depression Bipolar Disorder    GI/Hepatic PUD, (+)     substance abuse  ,   Endo/Other  negative endocrine ROS  Renal/GU negative Renal ROS     Musculoskeletal negative musculoskeletal ROS (+)   Abdominal   Peds  Hematology  (+) anemia ,   Anesthesia Other Findings Anastomotic ulcer  Reproductive/Obstetrics                            Anesthesia Physical  Anesthesia Plan  ASA: 2  Anesthesia Plan: MAC   Post-op Pain Management:    Induction:   PONV Risk Score and Plan: 1 and Ondansetron and Propofol infusion  Airway Management Planned: Natural Airway  Additional Equipment:   Intra-op Plan:   Post-operative Plan:   Informed Consent: I have reviewed the patients History and Physical, chart, labs and discussed the procedure including the risks, benefits and alternatives for the proposed anesthesia with the patient or authorized representative who has indicated his/her understanding and acceptance.     Dental advisory given  Plan Discussed with: Anesthesiologist, CRNA and Surgeon  Anesthesia Plan Comments:        Anesthesia Quick Evaluation

## 2021-01-17 NOTE — Consult Note (Signed)
UNASSIGNED PATIENT Reason for Consult: Severe epigastric pain. Referring Physician: [facility].  Terri [name] is an 45 y.o. female.  HPI: Terri [name] is a 45 year old white female with multiple medical problems including hypertension, GERD, bipolar disorder, chronic pain recall when a history of IV drug abuse including heroin who was noted to have a large perianastomotic ulcer on a CT scan done recently..  Patient has been noncompliant with medical advice and is left the hospital twice AMA.  She presented to the hospital again with recurrent symptoms to demanding narcotics. She denies she denies a history of melena hematochezia.  She denies history of nausea vomiting or hematemesis.  Past Medical History:  Diagnosis Date   Arthritis    Back and legs   Bipolar disorder (HCC)    Chronic back pain    Depression    DVT (deep venous thrombosis) (HCC)    early 20's leg   History of blood transfusion    Hypertension    Migraine headache    Neuropathy    Pneumonia    Positive PPD    x 2 last time 09/2017   Sciatica    Stress incontinence    Past Surgical History:  Procedure Laterality Date   ALVEOLOPLASTY Bilateral 01/31/2018   Procedure: ALVEOLOPLASTY;  Surgeon: Diona Browner, DDS;  Location: [facility];  Service: Oral Surgery;  Laterality: Bilateral;   ANKLE SURGERY Left 1992   with hardware   BACK SURGERY     BIOPSY  02/08/2019   Procedure: BIOPSY;  Surgeon: Rush Landmark Telford Nab., MD;  Location: [facility];  Service: Gastroenterology;;   CESAREAN SECTION     CHOLECYSTECTOMY N/A 02/15/2017   Procedure: LAPAROSCOPIC CHOLECYSTECTOMY;  Surgeon: Clovis Riley, MD;  Location: [facility];  Service: General;  Laterality: N/A;   ENTEROSCOPY N/A 02/08/2019   Procedure: ENTEROSCOPY;  Surgeon: Rush Landmark Telford Nab., MD;  Location[facility];  Service: Gastroenterology;  Laterality: N/A;   ESOPHAGOGASTRODUODENOSCOPY (EGD) WITH PROPOFOL N/A 09/15/2017   Procedure:  ESOPHAGOGASTRODUODENOSCOPY (EGD) WITH PROPOFOL;  Surgeon: Clarene Essex, MD;  [facility];  Service: Endoscopy;  Laterality: N/A;   ESOPHAGOGASTRODUODENOSCOPY (EGD) WITH PROPOFOL N/A 02/08/2019   Procedure: ESOPHAGOGASTRODUODENOSCOPY (EGD) WITH PROPOFOL;  Surgeon: Rush Landmark Telford Nab., MD;  [facility];  Service: Gastroenterology;  Laterality: N/A;   GASTRIC BYPASS     INCISION AND DRAINAGE OF PERITONSILLAR ABCESS Left 08/13/2017   Procedure: INCISION AND DRAINAGE OF LEFT NECK ABSCESS;  Surgeon: Melida Quitter, MD;  Location[facility];  Service: ENT;  Laterality: Left;   LAPAROSCOPIC LYSIS OF ADHESIONS  02/15/2017   Procedure: LAPAROSCOPIC LYSIS OF ADHESIONS;  Surgeon: Clovis Riley, MD;  [facility];  Service: General;;   LUMBAR DISC SURGERY     LUMBAR FUSION     SAVORY DILATION N/A 02/08/2019   Procedure: SAVORY DILATION;  Surgeon: Irving Copas., MD;  [facility];  Service: Gastroenterology;  Laterality: N/A;   TOOTH EXTRACTION Bilateral 01/31/2018   Procedure: DENTAL RESTORATION/EXTRACTIONS;  Surgeon: Diona Browner, DDS;  Location: [facility];  Service: Oral Surgery;  Laterality: Bilateral;   TUBAL LIGATION     UPPER GI ENDOSCOPY N/A 02/15/2017   Procedure: UPPER GI ENDOSCOPY;  Surgeon: Clovis Riley, MD;  Location[facility];  Service: General;  Laterality: N/A;    Family History  Problem Relation Age of Onset   Diabetes Mother    Hypertension Mother    Diabetes Father    Hypertension Father     Social History:  reports that she has been smoking cigarettes. She has a 29.00 pack-year smoking history. She has never used smokeless tobacco. She reports current drug use. Drugs: IV and Cocaine. She reports that she does not drink alcohol.  Allergies:  Allergies  Allergen Reactions   Ketorolac Tromethamine Itching and Nausea And Vomiting   Methocarbamol Diarrhea   Medications: I have reviewed the patient's current medications. Prior to  Admission:  Medications Prior to Admission  Medication Sig Dispense Refill Last Dose   acetaminophen (TYLENOL) 500 MG tablet Take 1 tablet (500 mg total) by mouth every 6 (six) hours as needed for moderate pain. (Patient not taking: No sig reported) 20 tablet 0 Completed Course   Scheduled:  nicotine  14 mg Transdermal Daily   pantoprazole (PROTONIX) IV  40 mg Intravenous Q12H   sucralfate  1 g Oral TID WC & HS    Results for orders placed or performed during the hospital encounter of 01/16/21 (from the past 48 hour(s))  CBC with Differential     Status: Abnormal   Collection Time: 01/16/21  7:40 PM  Result Value Ref Range   WBC 10.7 (H) 4.0 - 10.5 K/uL   RBC 5.55 (H) 3.87 - 5.11 MIL/uL   Hemoglobin 11.8 (L) 12.0 - 15.0 g/dL   HCT 37.2 36.0 - 46.0 %   MCV 67.0 (L) 80.0 - 100.0 fL   MCH 21.3 (L) 26.0 - 34.0 pg   MCHC 31.7 30.0 - 36.0 g/dL   RDW 21.8 (H) 11.5 - 15.5 %   Platelets 417 (H) 150 - 400 K/uL   nRBC 0.0 0.0 - 0.2 %   Neutrophils Relative % 56 %   Neutro Abs 6.1 1.7 - 7.7 K/uL   Lymphocytes Relative 34 %   Lymphs Abs 3.7 0.7 - 4.0 K/uL   Monocytes Relative 8 %   Monocytes Absolute 0.8 0.1 - 1.0 K/uL   Eosinophils Relative 1 %   Eosinophils Absolute 0.1 0.0 - 0.5 K/uL   Basophils Relative 1 %   Basophils Absolute 0.1 0.0 - 0.1 K/uL   Immature Granulocytes 0 %   Abs Immature Granulocytes 0.03 0.00 - 0.07 K/uL    Comment: Performed at [facility], [place] [address], [place], [place] [address]  Comprehensive metabolic panel     Status: Abnormal   Collection Time: 01/16/21 10:00 PM  Result Value Ref Range   Sodium 135 135 - 145 mmol/L   Potassium 3.3 (L) 3.5 - 5.1 mmol/L   Chloride 103 98 - 111 mmol/L   CO2 26 22 - 32 mmol/L   Glucose, Bld 83 70 - 99 mg/dL    Comment: Glucose reference range applies only to samples taken after fasting for at least 8 hours.   BUN 15 6 - 20 mg/dL   Creatinine, Ser 1.01 (H) 0.44 - 1.00 mg/dL   Calcium 8.5 (L) 8.9 -  10.3 mg/dL   Total Protein 6.0 (L) 6.5 - 8.1 g/dL   Albumin 2.6 (L) 3.5 - 5.0 g/dL   AST 19 15 - 41 U/L   ALT 13 0 - 44 U/L   Alkaline Phosphatase 97 38 - 126 U/L   Total Bilirubin 0.3 0.3 - 1.2 mg/dL   GFR, Estimated >60 >60 mL/min    Comment: (NOTE) Calculated using the CKD-EPI Creatinine Equation (2021)    Anion gap 6 5 - 15    Comment: Performed at [facility] [place] [address], [place], [place] [address]  Lipase, blood  Status: None   Collection Time: 01/16/21 10:00 PM  Result Value Ref Range   Lipase 37 11 - 51 U/L    Comment: Performed at [facility], [place] [address], [place], [place] [address]  Resp Panel by RT-PCR (Flu A&B, Covid) Nasopharyngeal Swab     Status: None   Collection Time: 01/16/21 10:26 PM   Specimen: Nasopharyngeal Swab; Nasopharyngeal(NP) swabs in vial transport medium  Result Value Ref Range   SARS Coronavirus 2 by RT PCR NEGATIVE NEGATIVE    Comment: (NOTE) SARS-CoV-2 target nucleic acids are NOT DETECTED.  The SARS-CoV-2 RNA is generally detectable in upper respiratory specimens during the acute phase of infection. The lowest concentration of SARS-CoV-2 viral copies this assay can detect is 138 copies/mL. A negative result does not preclude SARS-Cov-2 infection and should not be used as the sole basis for treatment or other patient management decisions. A negative result may occur with  improper specimen collection/handling, submission of specimen other than nasopharyngeal swab, presence of viral mutation(s) within the areas targeted by this assay, and inadequate number of viral copies(<138 copies/mL). A negative result must be combined with clinical observations, patient history, and epidemiological information. The expected result is Negative.  Fact Sheet for Patients:  EntrepreneurPulse.com.au  Fact Sheet for Healthcare Providers:  IncredibleEmployment.be  This test  is no t yet approved or cleared by the [place] FDA and  has been authorized for detection and/or diagnosis of SARS-CoV-2 by FDA under an Emergency Use Authorization (EUA). This EUA will remain  in effect (meaning this test can be used) for the duration of the COVID-19 declaration under Section 564(b)(1) of the Act, 21 U.S.C.section 360bbb-3(b)(1), unless the authorization is terminated  or revoked sooner.       Influenza A by PCR NEGATIVE NEGATIVE   Influenza B by PCR NEGATIVE NEGATIVE    Comment: (NOTE) The Xpert Xpress SARS-CoV-2/FLU/RSV plus assay is intended as an aid in the diagnosis of influenza from Nasopharyngeal swab specimens and should not be used as a sole basis for treatment. Nasal washings and aspirates are unacceptable for Xpert Xpress SARS-CoV-2/FLU/RSV testing.  Fact Sheet for Patients: EntrepreneurPulse.com.au  Fact Sheet for Healthcare Providers: IncredibleEmployment.be  This test is not yet approved or cleared by the [place] FDA and has been authorized for detection and/or diagnosis of SARS-CoV-2 by FDA under an Emergency Use Authorization (EUA). This EUA will remain in effect (meaning this test can be used) for the duration of the COVID-19 declaration under Section 564(b)(1) of the Act, 21 U.S.C. section 360bbb-3(b)(1), unless the authorization is terminated or revoked.  Performed at [facility], [place] [address], [place], [place] [address]   CBC     Status: Abnormal   Collection Time: 01/17/21  5:21 AM  Result Value Ref Range   WBC 5.9 4.0 - 10.5 K/uL   RBC 4.53 3.87 - 5.11 MIL/uL   Hemoglobin 9.6 (L) 12.0 - 15.0 g/dL   HCT 30.2 (L) 36.0 - 46.0 %   MCV 66.7 (L) 80.0 - 100.0 fL   MCH 21.2 (L) 26.0 - 34.0 pg   MCHC 31.8 30.0 - 36.0 g/dL   RDW 21.1 (H) 11.5 - 15.5 %   Platelets 301 150 - 400 K/uL   nRBC 0.0 0.0 - 0.2 %    Comment: Performed at [facility], [place]  [address], [place], [place] [id number]  Basic metabolic panel     Status: Abnormal   Collection Time: 01/17/21  5:21 AM  Result Value Ref Range   Sodium 139 135 - 145 mmol/L   Potassium 3.9 3.5 - 5.1 mmol/L   Chloride 108 98 - 111 mmol/L   CO2 25 22 - 32 mmol/L   Glucose, Bld 76 70 - 99 mg/dL    Comment: Glucose reference range applies only to samples taken after fasting for at least 8 hours.   BUN 14 6 - 20 mg/dL   Creatinine, Ser 0.82 0.44 - 1.00 mg/dL   Calcium 8.6 (L) 8.9 - 10.3 mg/dL   GFR, Estimated >60 >60 mL/min    Comment: (NOTE) Calculated using the CKD-EPI Creatinine Equation (2021)    Anion gap 6 5 - 15    Comment: Performed at [facility], [place] [address], [place], [place] [address]  Urine rapid drug screen (hosp performed)     Status: Abnormal   Collection Time: 01/17/21  5:28 AM  Result Value Ref Range   Opiates POSITIVE (A) NONE DETECTED   Cocaine POSITIVE (A) NONE DETECTED   Benzodiazepines POSITIVE (A) NONE DETECTED   Amphetamines POSITIVE (A) NONE DETECTED   Tetrahydrocannabinol NONE DETECTED NONE DETECTED   Barbiturates NONE DETECTED NONE DETECTED    Comment: (NOTE) DRUG SCREEN FOR MEDICAL PURPOSES ONLY.  IF CONFIRMATION IS NEEDED FOR ANY PURPOSE, NOTIFY LAB WITHIN 5 DAYS.  LOWEST DETECTABLE LIMITS FOR URINE DRUG SCREEN Drug Class                     Cutoff (ng/mL) Amphetamine and metabolites    1000 Barbiturate and metabolites    200 Benzodiazepine                 [id number] Tricyclics and metabolites     300 Opiates and metabolites        300 Cocaine and metabolites        300 THC                            50 Performed at [facility], [place] [address], [place], [place] [address]     CT Abdomen Pelvis W Contrast  Result Date: 01/17/2021 CLINICAL DATA:  Abdominal pain. EXAM: CT ABDOMEN AND PELVIS WITH CONTRAST TECHNIQUE: Multidetector CT imaging of the abdomen and pelvis was performed using the standard protocol  following bolus administration of intravenous contrast. CONTRAST:  43m OMNIPAQUE IOHEXOL 350 MG/ML SOLN COMPARISON:  January 13, 2021 FINDINGS: Lower chest: Mild atelectasis is seen within the posterior aspect of the right lung base. Hepatobiliary: A 1.1 cm x 0.6 cm cyst is seen within the lateral aspect of the right lobe of the liver. Status post cholecystectomy. The common bile duct measures approximately 1.0 cm. Pancreas: Unremarkable. No pancreatic ductal dilatation or surrounding inflammatory changes. Spleen: The spleen is mildly enlarged and otherwise normal in appearance. Adrenals/Urinary Tract: Adrenal glands are unremarkable. Kidneys are normal in size, without renal calculi or hydronephrosis. Renal cortical thinning is noted on the left. Bladder is unremarkable. Stomach/Bowel: Surgical sutures are seen within the gastric region consistent with Roux-en-Y gastric bypass. A rounded structure is again noted within the left upper quadrant, adjacent to the anastomosis, which demonstrates a thickened wall and adjacent inflammation (axial CT images 18 through 23, CT series 2). This finding is stable in appearance when compared to the prior study. Surgically anastomosed bowel is also seen within the anterior aspect of the left lower quadrant. Appendix is not identified. No  evidence of bowel dilatation. Vascular/Lymphatic: Aortic atherosclerosis. No enlarged abdominal or pelvic lymph nodes. Reproductive: Uterus and bilateral adnexa are unremarkable. Other: No abdominal wall hernia or abnormality. No abdominopelvic ascites. Musculoskeletal: Degenerative changes are seen within the lumbar spine, most prominent at the levels of L3-L4 and L4-L5. IMPRESSION: 1. Stable findings suspicious for a perianastomotic ulcer, status post recent Roux-en-Y gastric bypass. 2. Evidence of prior cholecystectomy. 3. Mild splenomegaly. Electronically Signed   By: Virgina Norfolk M.D.   On: 01/17/2021 01:23    Review of Systems   Constitutional:  Positive for appetite change and fatigue. Negative for activity change, chills, diaphoresis, fever and unexpected weight change.  HENT: Negative.    Eyes: Negative.   Respiratory: Negative.    Cardiovascular: Negative.   Gastrointestinal:  Positive for abdominal pain and constipation. Negative for abdominal distention, anal bleeding and blood in stool.  Endocrine: Negative.   Genitourinary: Negative.   Musculoskeletal:  Positive for arthralgias.  Allergic/Immunologic: Negative.   Neurological: Negative.   Psychiatric/Behavioral:  Positive for agitation. The patient is nervous/anxious.   Blood pressure (!) 155/96, pulse 70, temperature (!) 97.5 F (36.4 C), temperature source Oral, resp. rate 16, SpO2 97 %. Physical Exam Constitutional:      General: She is not in acute distress.    Appearance: She is cachectic. She is ill-appearing.  HENT:     Head: Normocephalic and atraumatic.     Comments: Patient is edentulous Eyes:     Extraocular Movements: Extraocular movements intact.     Pupils: Pupils are equal, round, and reactive to light.  Cardiovascular:     Rate and Rhythm: Normal rate and regular rhythm.  Pulmonary:     Effort: Pulmonary effort is normal.     Breath sounds: Normal breath sounds.  Abdominal:     General: Abdomen is flat.     Palpations: Abdomen is soft.     Tenderness: There is abdominal tenderness in the epigastric area.  Skin:    General: Skin is warm and dry.  Neurological:     Mental Status: She is alert.    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 2) Severe malnutrition/cachexia. 3) Hypertension. 4) History of IVDA. Juanita Craver 01/17/2021, 7:15 AM

## 2021-01-17 NOTE — Op Note (Addendum)
[facility] Patient Name: Terri [name] Procedure Date: 01/17/2021 MRN: [id number] Attending MD: Juanita Craver , MD Date of Birth: 25-Jan-1976 CSN: [id number] Age: 45 Admit Type: Inpatient Procedure:                Diagnostic EGD. Indications:              Epigastric pain, Gastro-esophageal reflux disease,                            Abnormal CT scan-anastamotic ulcer on CT-patient is                            s/p Rou-en-Y gastric bypass. Providers:                Juanita Craver, MD, Particia Nearing, RN, Elspeth Cho                            Tech., Technician, Lerry Paterson, CRNA Referring MD:             [facility]. Medicines:                Monitored Anesthesia Care Complications:            No immediate complications. Estimated Blood Loss:     Estimated blood loss: none. Procedure:                Pre-Anesthesia Assessment: - Prior to the                            procedure, a history and physical was performed,                            and patient medications and allergies were                            reviewed. The patient's tolerance of previous                            anesthesia was also reviewed. The risks and                            benefits of the procedure and the sedation options                            and risks were discussed with the patient. All                            questions were answered, and informed consent was                            obtained. Prior Anticoagulants: The patient has                            taken no previous anticoagulant or antiplatelet  agents except for NSAID medication. ASA Grade                            Assessment: III - A patient with severe systemic                            disease. After reviewing the risks and benefits,                            the patient was deemed in satisfactory condition to                            undergo the procedure. After obtaining informed                             consent, the endoscope was passed under direct                            vision. Throughout the procedure, the patient's                            blood pressure, pulse, and oxygen saturations were                            monitored continuously. The [id number]-[id number] ([id number])                            Olympus endoscope was introduced through the mouth,                            and advanced to the jejunum. The EGD was performed                            with moderate difficulty due to presence of foreign                            body and poor endoscopic visualization. Successful                            completion of the procedure was aided by lavage.                            The patient tolerated the procedure well. Scope In: Scope Out: Findings:      The examined esophagus and the GEJ appeared widely patent and normal.      Evidence of a Roux-en-Y gastrojejunostomy was found; there was a giant       ulcer noted at the anastamosis with some debris in thew gastric pouch;       visualization was limited; the duodenum-to-jejunum limb was examined 10       cm from the anastomosis appeared normal. Impression:               - Normal appearing, widely patent esophagus and GEJ.                           -  Roux-en-Y gastrojejunostomy with a giant ulcer at                            gastrojejunal anastomosis without evidence of                            hemorrhage or ulceration.                           - No specimens collected. Moderate Sedation:      MAC used. Recommendation:           - Soft diet.                           - Continue present medications.                           - Continue PPI's indefinitely & Sucralfate-for a                            least 1 month.                           - NO IBUPROFEN, BC'S GOODIES, ADVIL, ALEVE or other                            non-steroidal anti-inflammatory drugs. Procedure Code(s):        --- Professional  ---                           [phone], Esophagogastroduodenoscopy, flexible,                            transoral; diagnostic, including collection of                            specimen(s) by brushing or washing, when performed                            (separate procedure) Diagnosis Code(s):        --- Professional ---                           R10.13, Epigastric pain                           K28.3, Acute gastrojejunal ulcer without hemorrhage                            or perforation                           K21.9, Gastro-esophageal reflux disease without                            esophagitis  Z98.0, Intestinal bypass and anastomosis status CPT copyright 2019 American Medical Association. All rights reserved. The codes documented in this report are preliminary and upon coder review may  be revised to meet current compliance requirements. Juanita Craver, MD Juanita Craver, MD 01/17/2021 12:27:06 PM This report has been signed electronically. Number of Addenda: 0

## 2021-01-18 DIAGNOSIS — K253 Acute gastric ulcer without hemorrhage or perforation: Secondary | ICD-10-CM

## 2021-01-18 LAB — MAGNESIUM: Magnesium: 2 mg/dL (ref 1.7–2.4)

## 2021-01-18 LAB — CBC WITH DIFFERENTIAL/PLATELET
Abs Immature Granulocytes: 0.01 10*3/uL (ref 0.00–0.07)
Basophils Absolute: 0.1 10*3/uL (ref 0.0–0.1)
Basophils Relative: 1 %
Eosinophils Absolute: 0 10*3/uL (ref 0.0–0.5)
Eosinophils Relative: 1 %
HCT: 31.9 % — ABNORMAL LOW (ref 36.0–46.0)
Hemoglobin: 10.3 g/dL — ABNORMAL LOW (ref 12.0–15.0)
Immature Granulocytes: 0 %
Lymphocytes Relative: 42 %
Lymphs Abs: 2.5 10*3/uL (ref 0.7–4.0)
MCH: 21 pg — ABNORMAL LOW (ref 26.0–34.0)
MCHC: 32.3 g/dL (ref 30.0–36.0)
MCV: 65 fL — ABNORMAL LOW (ref 80.0–100.0)
Monocytes Absolute: 0.4 10*3/uL (ref 0.1–1.0)
Monocytes Relative: 6 %
Neutro Abs: 3 10*3/uL (ref 1.7–7.7)
Neutrophils Relative %: 50 %
Platelets: 339 10*3/uL (ref 150–400)
RBC: 4.91 MIL/uL (ref 3.87–5.11)
RDW: 20.8 % — ABNORMAL HIGH (ref 11.5–15.5)
WBC: 6 10*3/uL (ref 4.0–10.5)
nRBC: 0 % (ref 0.0–0.2)

## 2021-01-18 LAB — COMPREHENSIVE METABOLIC PANEL
ALT: 11 U/L (ref 0–44)
AST: 19 U/L (ref 15–41)
Albumin: 2.6 g/dL — ABNORMAL LOW (ref 3.5–5.0)
Alkaline Phosphatase: 85 U/L (ref 38–126)
Anion gap: 7 (ref 5–15)
BUN: 12 mg/dL (ref 6–20)
CO2: 24 mmol/L (ref 22–32)
Calcium: 8.8 mg/dL — ABNORMAL LOW (ref 8.9–10.3)
Chloride: 107 mmol/L (ref 98–111)
Creatinine, Ser: 0.74 mg/dL (ref 0.44–1.00)
GFR, Estimated: >60 mL/min (ref >60)
Glucose, Bld: 78 mg/dL (ref 70–99)
Potassium: 4 mmol/L (ref 3.5–5.1)
Sodium: 138 mmol/L (ref 135–145)
Total Bilirubin: 0.6 mg/dL (ref 0.3–1.2)
Total Protein: 5.8 g/dL — ABNORMAL LOW (ref 6.5–8.1)

## 2021-01-18 LAB — CULTURE, BLOOD (ROUTINE X 2)
Culture: NO GROWTH
Special Requests: ADEQUATE

## 2021-01-18 MED ORDER — AMLODIPINE BESYLATE 10 MG PO TABS
10.0000 mg | ORAL_TABLET | Freq: Every day | ORAL | Status: DC
  Filled 2021-01-18: qty 1

## 2021-01-18 MED ORDER — MELATONIN 5 MG PO TABS
10.0000 mg | ORAL_TABLET | Freq: Once | ORAL | Status: AC
  Administered 2021-01-18: 10 mg via ORAL
  Filled 2021-01-18: qty 2

## 2021-01-18 MED ORDER — OXYCODONE HCL 5 MG PO TABS
5.0000 mg | ORAL_TABLET | Freq: Four times a day (QID) | ORAL | Status: DC | PRN
Start: 2021-01-18 — End: 2021-01-18

## 2021-01-18 NOTE — Discharge Summary (Deleted)
  The note originally documented on this encounter has been moved the the encounter in which it belongs.  

## 2021-01-18 NOTE — Progress Notes (Signed)
Pt emerged out of her room fully dressed and determined to leave. Pt had pulled IV out. Signed AMA paper.

## 2021-01-18 NOTE — Progress Notes (Signed)
Pt wanted to get a shower. Refused medications at this time. Bp elevated earlier yet refused recheck saying "I just want to get a shower." Locked IV and wrapped hand. Pt ambulatory and steady on feet. No distress.

## 2021-01-18 NOTE — Discharge Summary (Signed)
[facility] Discharge Summary   Patient: Terri [name] [id number]   PCP: Patient, No Pcp Per (Inactive) DOB: [birth year]   Date of admission: 01/16/2021   Date of discharge: 01/18/2021    Discharge Disposition: Patient signed out AMA despite being explained the risks of doing so including worsening medical condition as well as death.   Discharge Diagnoses: Severe abdominal pain possibly from gastric ulcer at the site of surgical anastomosis in a patient with gastric bypass Polysubstance abuse Microcytic anemia History of anxiety/bipolar disorder Hypertension Hypokalemia   Discharge Condition: Guarded   Hospital Course:  45 year old female with history of hypertension, GERD with known gastric ulcer, bipolar disorder, chronic pain, gastric bypass, illicit drug use including cocaine and heroin, recent hospitalization from 01/13/2021-01/14/2021 for abdominal pain, GI was planning to do EGD but patient signed out [place] on 01/14/2021  followed by another admission from 01/14/2021-01/16/2021 with GI planning for EGD on 01/16/2021 but procedure was canceled because of high blood pressure and subsequently patient signed out [place] again on 01/16/2021. She presented again on the same day on 01/16/2021 with abdominal pain and was subsequently admitted.  GI was consulted.  She was started on IV Protonix.  She underwent EGD on 01/17/2021 which showed giant ulcer at gastrojejunal anastomosis without hemorrhage or ulceration.  GI recommended PPIs indefinitely and sucralfate for at least a month. Patient signed out AMA despite being explained the risks of doing so including worsening medical condition as well as death.   Procedures and Results: EGD   Consultations: GI  The results of significant diagnostics from this hospitalization (including imaging, microbiology, ancillary and laboratory) are listed below for reference.    Significant Diagnostic Studies: CT Abdomen  Pelvis W Contrast  Result Date: 01/17/2021 CLINICAL DATA:  Abdominal pain. EXAM: CT ABDOMEN AND PELVIS WITH CONTRAST TECHNIQUE: Multidetector CT imaging of the abdomen and pelvis was performed using the standard protocol following bolus administration of intravenous contrast. CONTRAST:  78m OMNIPAQUE IOHEXOL 350 MG/ML SOLN COMPARISON:  January 13, 2021 FINDINGS: Lower chest: Mild atelectasis is seen within the posterior aspect of the right lung base. Hepatobiliary: A 1.1 cm x 0.6 cm cyst is seen within the lateral aspect of the right lobe of the liver. Status post cholecystectomy. The common bile duct measures approximately 1.0 cm. Pancreas: Unremarkable. No pancreatic ductal dilatation or surrounding inflammatory changes. Spleen: The spleen is mildly enlarged and otherwise normal in appearance. Adrenals/Urinary Tract: Adrenal glands are unremarkable. Kidneys are normal in size, without renal calculi or hydronephrosis. Renal cortical thinning is noted on the left. Bladder is unremarkable. Stomach/Bowel: Surgical sutures are seen within the gastric region consistent with Roux-en-Y gastric bypass. A rounded structure is again noted within the left upper quadrant, adjacent to the anastomosis, which demonstrates a thickened wall and adjacent inflammation (axial CT images 18 through 23, CT series 2). This finding is stable in appearance when compared to the prior study. Surgically anastomosed bowel is also seen within the anterior aspect of the left lower quadrant. Appendix is not identified. No evidence of bowel dilatation. Vascular/Lymphatic: Aortic atherosclerosis. No enlarged abdominal or pelvic lymph nodes. Reproductive: Uterus and bilateral adnexa are unremarkable. Other: No abdominal wall hernia or abnormality. No abdominopelvic ascites. Musculoskeletal: Degenerative changes are seen within the lumbar spine, most prominent at the levels of L3-L4 and L4-L5. IMPRESSION: 1. Stable findings suspicious for a  perianastomotic ulcer, status post recent Roux-en-Y gastric bypass. 2. Evidence of prior cholecystectomy. 3. Mild splenomegaly. Electronically Signed  By: Virgina Norfolk M.D.   On: 01/17/2021 01:23   CT ABDOMEN PELVIS W CONTRAST  Result Date: 01/13/2021 CLINICAL DATA:  45 year old female with history of abdominal pain shooting up into her chest for 1 week. EXAM: CT ABDOMEN AND PELVIS WITH CONTRAST TECHNIQUE: Multidetector CT imaging of the abdomen and pelvis was performed using the standard protocol following bolus administration of intravenous contrast. CONTRAST:  99m OMNIPAQUE IOHEXOL 350 MG/ML SOLN COMPARISON:  CT the abdomen and pelvis 02/08/2019. FINDINGS: Lower chest: Scattered areas of mild cylindrical bronchiectasis with some associated peribronchovascular ground-glass attenuation micro nodularity and additional small pulmonary nodules scattered throughout the visualized lung bases, largest of which measures 5 mm in the left lower lobe (axial image 19 of series 6). Areas of scarring and/or subsegmental atelectasis in the lower lobes of the lungs bilaterally. Atherosclerotic calcifications in the thoracic aorta. Hepatobiliary: In the periphery of the right lobe of the liver between segments 5 and 8 (axial image 21 of series 2) there is a small subcapsular lesion measuring 1.2 x 0.5 cm, too small to characterize, but similar to the prior study and statistically likely to represent a cyst. No other larger more suspicious appearing hepatic lesions are noted. Status post cholecystectomy. Minimal intrahepatic biliary ductal dilatation and slight prominence of the common bile duct which measures 1 cm in the porta hepatis likely reflects benign post cholecystectomy physiology. Pancreas: No pancreatic mass. No pancreatic ductal dilatation. No pancreatic or peripancreatic fluid collections or inflammatory changes. Spleen: Spleen is mildly enlarged measuring 10.5 x 4.9 x 14.7 cm (estimated splenic volume of  378 mL). Adrenals/Urinary Tract: Extensive cortical thinning is noted in the left kidney. Right kidney and bilateral adrenal glands are normal in appearance. No hydroureteronephrosis. Small amount of gas non dependently in the lumen of the urinary bladder. Urinary bladder is otherwise normal in appearance. Stomach/Bowel: Postoperative changes of Roux-en-Y gastric bypass. Adjacent to the anastomosis there is a round structure in the left upper quadrant of the abdomen (axial image 25 of series 2) which has a very thickened hazy irregular wall, concerning for an ulcer. It is uncertain whether this arises directly from the anastomotic suture line or is within the adjacent portions of either the small bowel or stomach. There is a large amount of surrounding inflammation, very intimately associated with the proximal small bowel, well appreciated anteriorly on axial image 28 of series 2. No well-defined fluid collection is identified in this region. No pathologic dilatation of small bowel or colon. Appendix is not confidently identified may be surgically absent or atrophic. Vascular/Lymphatic: Aortic atherosclerosis, without evidence of aneurysm or dissection in the abdominal or pelvic vasculature. No lymphadenopathy noted in the abdomen or pelvis. Reproductive: Uterus and ovaries are a trophic. Other: Mild diffuse mesenteric edema. Small volume of ascites. No pneumoperitoneum. Musculoskeletal: Mild diffuse body wall edema. Status post laminectomy at L4-L5. Multilevel degenerative disc disease, most severe at L3-L4 where there are advanced discogenic endplate changes. There are no aggressive appearing lytic or blastic lesions noted in the visualized portions of the skeleton. IMPRESSION: 1. Findings are highly concerning for large perianastomotic ulcer in this patient who is status post Roux-en-Y gastric bypass, as detailed above. 2. Anasarca. 3. Compared to the prior examination there is extensive thinning of the cortex of  the left kidney, presumably scarring from prior obstruction/infection. 4. Small amount of gas in the nondependent portion of the lumen of the urinary bladder on today's examination. If there has been recent catheterization for urinalysis, this is presumably  iatrogenic. In the absence of a recent history of catheterization, further evaluation with urinalysis would be recommended to exclude the possibility of urinary tract infection with gas-forming organisms. 5. Findings in the lung bases which could be seen in the setting of chronic indolent atypical infectious process such as MAI (mycobacterium avium intracellulare). Nonemergent outpatient referral to Pulmonology for further clinical evaluation is suggested. 6. Mild splenomegaly. 7. Additional incidental findings, as above. Electronically Signed   By: Vinnie Langton M.D.   On: 01/13/2021 12:12    Microbiology: Recent Results (from the past 240 hour(s))  Urine Culture     Status: Abnormal   Collection Time: 01/13/21  9:12 AM   Specimen: Urine, Clean Catch  Result Value Ref Range Status   Specimen Description   Final    URINE, CLEAN CATCH Performed at [facility], [place] [address], [place], [place] [address]    Special Requests   Final    NONE Performed at [facility], [place] [address], [place], [place] [address]    Culture >=100,000 COLONIES/mL ESCHERICHIA COLI (A)  Final   Report Status 01/16/2021 FINAL  Final   Organism ID, Bacteria ESCHERICHIA COLI (A)  Final      Susceptibility   Escherichia coli - MIC*    AMPICILLIN <=2 SENSITIVE Sensitive     CEFAZOLIN <=4 SENSITIVE Sensitive     CEFEPIME <=0.12 SENSITIVE Sensitive     CEFTRIAXONE <=0.25 SENSITIVE Sensitive     CIPROFLOXACIN <=0.25 SENSITIVE Sensitive     GENTAMICIN <=1 SENSITIVE Sensitive     IMIPENEM <=0.25 SENSITIVE Sensitive     NITROFURANTOIN <=16 SENSITIVE Sensitive     TRIMETH/SULFA <=20 SENSITIVE Sensitive     AMPICILLIN/SULBACTAM <=2  SENSITIVE Sensitive     PIP/TAZO <=4 SENSITIVE Sensitive     * >=100,000 COLONIES/mL ESCHERICHIA COLI  SARS CORONAVIRUS 2 (TAT 6-24 HRS) Nasopharyngeal Nasopharyngeal Swab     Status: None   Collection Time: 01/13/21  3:18 PM   Specimen: Nasopharyngeal Swab  Result Value Ref Range Status   SARS Coronavirus 2 NEGATIVE NEGATIVE Final    Comment: (NOTE) SARS-CoV-2 target nucleic acids are NOT DETECTED.  The SARS-CoV-2 RNA is generally detectable in upper and lower respiratory specimens during the acute phase of infection. Negative results do not preclude SARS-CoV-2 infection, do not rule out co-infections with other pathogens, and should not be used as the sole basis for treatment or other patient management decisions. Negative results must be combined with clinical observations, patient history, and epidemiological information. The expected result is Negative.  Fact Sheet for Patients: SugarRoll.be  Fact Sheet for Healthcare Providers: https://www.woods-mathews.com/  This test is not yet approved or cleared by the [place] FDA and  has been authorized for detection and/or diagnosis of SARS-CoV-2 by FDA under an Emergency Use Authorization (EUA). This EUA will remain  in effect (meaning this test can be used) for the duration of the COVID-19 declaration under Se ction 564(b)(1) of the Act, 21 U.S.C. section 360bbb-3(b)(1), unless the authorization is terminated or revoked sooner.  Performed at [facility], [place] [address], [place], [place] [address]   Culture, blood (routine x 2)     Status: None (Preliminary result)   Collection Time: 01/13/21  6:50 PM   Specimen: BLOOD  Result Value Ref Range Status   Specimen Description   Final    BLOOD RIGHT ANTECUBITAL Performed at [facility] [address], [place], [place] [address]  Special Requests   Final    BOTTLES DRAWN AEROBIC AND ANAEROBIC Blood  Culture adequate volume Performed at [facility] [address] [place], [place] [address]    Culture   Final    NO GROWTH 4 DAYS Performed at [facility], [place] [address] [place], [place] [address]    Report Status PENDING  Incomplete  MRSA Next Gen by PCR, Nasal     Status: None   Collection Time: 01/13/21  8:54 PM   Specimen: Nasal Mucosa; Nasal Swab  Result Value Ref Range Status   MRSA by PCR Next Gen NOT DETECTED NOT DETECTED Final    Comment: (NOTE) The GeneXpert MRSA Assay (FDA approved for NASAL specimens only), is one component of a comprehensive MRSA colonization surveillance program. It is not intended to diagnose MRSA infection nor to guide or monitor treatment for MRSA infections. Test performance is not FDA approved in patients less than 9 years old. Performed at [facility], [place] [address] [place], [place] [address]   Resp Panel by RT-PCR (Flu A&B, Covid) Nasopharyngeal Swab     Status: None   Collection Time: 01/14/21  4:01 PM   Specimen: Nasopharyngeal Swab; Nasopharyngeal(NP) swabs in vial transport medium  Result Value Ref Range Status   SARS Coronavirus 2 by RT PCR NEGATIVE NEGATIVE Final    Comment: (NOTE) SARS-CoV-2 target nucleic acids are NOT DETECTED.  The SARS-CoV-2 RNA is generally detectable in upper respiratory specimens during the acute phase of infection. The lowest concentration of SARS-CoV-2 viral copies this assay can detect is 138 copies/mL. A negative result does not preclude SARS-Cov-2 infection and should not be used as the sole basis for treatment or other patient management decisions. A negative result may occur with  improper specimen collection/handling, submission of specimen other than nasopharyngeal swab, presence of viral mutation(s) within the areas targeted by this assay, and inadequate number of viral copies(<138 copies/mL). A negative result must be combined with clinical  observations, patient history, and epidemiological information. The expected result is Negative.  Fact Sheet for Patients:  EntrepreneurPulse.com.au  Fact Sheet for Healthcare Providers:  IncredibleEmployment.be  This test is no t yet approved or cleared by the [place] FDA and  has been authorized for detection and/or diagnosis of SARS-CoV-2 by FDA under an Emergency Use Authorization (EUA). This EUA will remain  in effect (meaning this test can be used) for the duration of the COVID-19 declaration under Section 564(b)(1) of the Act, 21 U.S.C.section 360bbb-3(b)(1), unless the authorization is terminated  or revoked sooner.       Influenza A by PCR NEGATIVE NEGATIVE Final   Influenza B by PCR NEGATIVE NEGATIVE Final    Comment: (NOTE) The Xpert Xpress SARS-CoV-2/FLU/RSV plus assay is intended as an aid in the diagnosis of influenza from Nasopharyngeal swab specimens and should not be used as a sole basis for treatment. Nasal washings and aspirates are unacceptable for Xpert Xpress SARS-CoV-2/FLU/RSV testing.  Fact Sheet for Patients: EntrepreneurPulse.com.au  Fact Sheet for Healthcare Providers: IncredibleEmployment.be  This test is not yet approved or cleared by the [place] FDA and has been authorized for detection and/or diagnosis of SARS-CoV-2 by FDA under an Emergency Use Authorization (EUA). This EUA will remain in effect (meaning this test can be used) for the duration of the COVID-19 declaration under Section 564(b)(1) of the Act, 21 U.S.C. section 360bbb-3(b)(1), unless the authorization is terminated or revoked.  Performed at [facility], [address] [place], [place] [address]   Resp Panel by RT-PCR (Flu A&B, Covid) Nasopharyngeal Swab     Status: None   Collection Time: 01/16/21 10:26 PM   Specimen: Nasopharyngeal Swab; Nasopharyngeal(NP) swabs in vial  transport medium  Result Value Ref Range Status   SARS Coronavirus 2 by RT PCR NEGATIVE NEGATIVE Final    Comment: (NOTE) SARS-CoV-2 target nucleic acids are NOT DETECTED.  The SARS-CoV-2 RNA is generally detectable in upper respiratory specimens during the acute phase of infection. The lowest concentration of SARS-CoV-2 viral copies this assay can detect is 138 copies/mL. A negative result does not preclude SARS-Cov-2 infection and should not be used as the sole basis for treatment or other patient management decisions. A negative result may occur with  improper specimen collection/handling, submission of specimen other than nasopharyngeal swab, presence of viral mutation(s) within the areas targeted by this assay, and inadequate number of viral copies(<138 copies/mL). A negative result must be combined with clinical observations, patient history, and epidemiological information. The expected result is Negative.  Fact Sheet for Patients:  EntrepreneurPulse.com.au  Fact Sheet for Healthcare Providers:  IncredibleEmployment.be  This test is no t yet approved or cleared by the [place] FDA and  has been authorized for detection and/or diagnosis of SARS-CoV-2 by FDA under an Emergency Use Authorization (EUA). This EUA will remain  in effect (meaning this test can be used) for the duration of the COVID-19 declaration under Section 564(b)(1) of the Act, 21 U.S.C.section 360bbb-3(b)(1), unless the authorization is terminated  or revoked sooner.       Influenza A by PCR NEGATIVE NEGATIVE Final   Influenza B by PCR NEGATIVE NEGATIVE Final    Comment: (NOTE) The Xpert Xpress SARS-CoV-2/FLU/RSV plus assay is intended as an aid in the diagnosis of influenza from Nasopharyngeal swab specimens and should not be used as a sole basis for treatment. Nasal washings and aspirates are unacceptable for Xpert Xpress SARS-CoV-2/FLU/RSV testing.  Fact  Sheet for Patients: EntrepreneurPulse.com.au  Fact Sheet for Healthcare Providers: IncredibleEmployment.be  This test is not yet approved or cleared by the [place] FDA and has been authorized for detection and/or diagnosis of SARS-CoV-2 by FDA under an Emergency Use Authorization (EUA). This EUA will remain in effect (meaning this test can be used) for the duration of the COVID-19 declaration under Section 564(b)(1) of the Act, 21 U.S.C. section 360bbb-3(b)(1), unless the authorization is terminated or revoked.  Performed at [facility], [place] [address], [place], [place] [address]      Labs: CBC: Recent Labs  Lab 01/13/21 [id number] 01/14/21 1254 01/15/21 0409 01/16/21 1940 01/17/21 0521 01/18/21 0516  WBC 7.6 6.6 5.1 10.7* 5.9 6.0  NEUTROABS 4.7 3.7  --  6.1  --  3.0  HGB 10.6* 12.1 9.6* 11.8* 9.6* 10.3*  HCT 32.5* 37.6 29.6* 37.2 30.2* 31.9*  MCV 65.1* 65.4* 64.6* 67.0* 66.7* 65.0*  PLT 343 375 302 417* 301 [id number]   Basic Metabolic Panel: Recent Labs  Lab 01/14/21 1254 01/15/21 0409 01/16/21 2200 01/17/21 0521 01/18/21 0516  NA 136 136 135 139 138  K 3.1* 3.8 3.3* 3.9 4.0  CL 100 104 103 108 107  CO2 '24 27 26 25 24  '$ GLUCOSE 89 80 83 76 78  BUN '12 14 15 14 12  '$ CREATININE 0.72 0.65 1.01* 0.82 0.74  CALCIUM 8.6* 8.4* 8.5* 8.6* 8.8*  MG 2.1  --   --   --  2.0   Liver Function Tests:  Recent Labs  Lab 01/13/21 0349 01/14/21 1254 01/15/21 0409 01/16/21 2200 01/18/21 0516  AST '22 24 17 19 19  '$ ALT '15 15 12 13 11  '$ ALKPHOS 118 121 104 97 85  BILITOT 0.4 0.7 0.6 0.3 0.6  PROT 6.5 6.8 5.5* 6.0* 5.8*  ALBUMIN 2.8* 3.0* 2.4* 2.6* 2.6*   Recent Labs  Lab 01/13/21 0349 01/16/21 2200  LIPASE 23 37   No results for input(s): AMMONIA in the last 168 hours. Cardiac Enzymes: No results for input(s): CKTOTAL, CKMB, CKMBINDEX, TROPONINI in the last 168 hours. BNP (last 3 results) No results for input(s): BNP  in the last 8760 hours. CBG: No results for input(s): GLUCAP in the last 168 hours.   Signed:  Aline August  [facility] 01/18/2021, 11:43 AM

## 2021-01-19 ENCOUNTER — Encounter (HOSPITAL_COMMUNITY): Payer: Self-pay | Admitting: Gastroenterology

## 2021-02-24 ENCOUNTER — Encounter (HOSPITAL_COMMUNITY): Payer: Self-pay | Admitting: Radiology

## 2021-02-25 ENCOUNTER — Other Ambulatory Visit: Payer: Self-pay

## 2021-02-25 ENCOUNTER — Encounter (HOSPITAL_COMMUNITY): Payer: Self-pay

## 2021-02-25 ENCOUNTER — Inpatient Hospital Stay (HOSPITAL_COMMUNITY)
Admission: EM | Admit: 2021-02-25 | Discharge: 2021-03-02 | DRG: 690 | Discharge status: Against Medical Advice | Payer: Medicaid Other | Attending: Internal Medicine | Admitting: Internal Medicine

## 2021-02-25 DIAGNOSIS — Z720 Tobacco use: Secondary | ICD-10-CM | POA: Diagnosis present

## 2021-02-25 DIAGNOSIS — Z888 Allergy status to other drugs, medicaments and biological substances status: Secondary | ICD-10-CM

## 2021-02-25 DIAGNOSIS — N12 Tubulo-interstitial nephritis, not specified as acute or chronic: Secondary | ICD-10-CM

## 2021-02-25 DIAGNOSIS — Z981 Arthrodesis status: Secondary | ICD-10-CM

## 2021-02-25 DIAGNOSIS — F419 Anxiety disorder, unspecified: Secondary | ICD-10-CM | POA: Diagnosis present

## 2021-02-25 DIAGNOSIS — A419 Sepsis, unspecified organism: Secondary | ICD-10-CM

## 2021-02-25 DIAGNOSIS — F17213 Nicotine dependence, cigarettes, with withdrawal: Secondary | ICD-10-CM | POA: Diagnosis present

## 2021-02-25 DIAGNOSIS — E441 Mild protein-calorie malnutrition: Secondary | ICD-10-CM | POA: Diagnosis present

## 2021-02-25 DIAGNOSIS — I10 Essential (primary) hypertension: Secondary | ICD-10-CM | POA: Diagnosis present

## 2021-02-25 DIAGNOSIS — Z8249 Family history of ischemic heart disease and other diseases of the circulatory system: Secondary | ICD-10-CM

## 2021-02-25 DIAGNOSIS — F1410 Cocaine abuse, uncomplicated: Secondary | ICD-10-CM | POA: Diagnosis present

## 2021-02-25 DIAGNOSIS — N10 Acute tubulo-interstitial nephritis: Principal | ICD-10-CM | POA: Diagnosis present

## 2021-02-25 DIAGNOSIS — D509 Iron deficiency anemia, unspecified: Secondary | ICD-10-CM | POA: Diagnosis present

## 2021-02-25 DIAGNOSIS — Z5900 Homelessness unspecified: Secondary | ICD-10-CM

## 2021-02-25 DIAGNOSIS — R1084 Generalized abdominal pain: Secondary | ICD-10-CM

## 2021-02-25 DIAGNOSIS — Z20822 Contact with and (suspected) exposure to covid-19: Secondary | ICD-10-CM | POA: Diagnosis present

## 2021-02-25 DIAGNOSIS — G894 Chronic pain syndrome: Secondary | ICD-10-CM | POA: Diagnosis present

## 2021-02-25 DIAGNOSIS — R5381 Other malaise: Secondary | ICD-10-CM | POA: Diagnosis present

## 2021-02-25 DIAGNOSIS — Z6820 Body mass index (BMI) 20.0-20.9, adult: Secondary | ICD-10-CM

## 2021-02-25 DIAGNOSIS — Z5329 Procedure and treatment not carried out because of patient's decision for other reasons: Secondary | ICD-10-CM | POA: Diagnosis present

## 2021-02-25 DIAGNOSIS — Z833 Family history of diabetes mellitus: Secondary | ICD-10-CM

## 2021-02-25 DIAGNOSIS — E876 Hypokalemia: Secondary | ICD-10-CM | POA: Diagnosis present

## 2021-02-25 NOTE — ED Triage Notes (Signed)
Pt BIB EMS, pt reports having a hx of stomach ulcers, she states that the pain has gotten worse. Pt complains of pain to the right and left upper quadrants. Hx of drug abuse.    210/140 bp

## 2021-02-26 ENCOUNTER — Emergency Department (HOSPITAL_COMMUNITY): Payer: Medicaid Other

## 2021-02-26 ENCOUNTER — Encounter (HOSPITAL_COMMUNITY): Payer: Self-pay | Admitting: Internal Medicine

## 2021-02-26 DIAGNOSIS — E441 Mild protein-calorie malnutrition: Secondary | ICD-10-CM | POA: Diagnosis present

## 2021-02-26 DIAGNOSIS — N10 Acute tubulo-interstitial nephritis: Principal | ICD-10-CM

## 2021-02-26 DIAGNOSIS — D509 Iron deficiency anemia, unspecified: Secondary | ICD-10-CM | POA: Diagnosis present

## 2021-02-26 LAB — COMPREHENSIVE METABOLIC PANEL
ALT: 14 U/L (ref 0–44)
AST: 17 U/L (ref 15–41)
Albumin: 3 g/dL — ABNORMAL LOW (ref 3.5–5.0)
Alkaline Phosphatase: 108 U/L (ref 38–126)
Anion gap: 10 (ref 5–15)
BUN: 11 mg/dL (ref 6–20)
CO2: 29 mmol/L (ref 22–32)
Calcium: 9 mg/dL (ref 8.9–10.3)
Chloride: 97 mmol/L — ABNORMAL LOW (ref 98–111)
Creatinine, Ser: 0.8 mg/dL (ref 0.44–1.00)
GFR, Estimated: >60 mL/min (ref >60)
Glucose, Bld: 115 mg/dL — ABNORMAL HIGH (ref 70–99)
Potassium: 2.9 mmol/L — ABNORMAL LOW (ref 3.5–5.1)
Sodium: 136 mmol/L (ref 135–145)
Total Bilirubin: 1 mg/dL (ref 0.3–1.2)
Total Protein: 6.7 g/dL (ref 6.5–8.1)

## 2021-02-26 LAB — URINALYSIS, ROUTINE W REFLEX MICROSCOPIC
Bilirubin Urine: NEGATIVE
Glucose, UA: NEGATIVE mg/dL
Ketones, ur: NEGATIVE mg/dL
Nitrite: NEGATIVE
Protein, ur: 30 mg/dL — AB
Specific Gravity, Urine: 1.011 (ref 1.005–1.030)
pH: 6 (ref 5.0–8.0)

## 2021-02-26 LAB — RESP PANEL BY RT-PCR (FLU A&B, COVID) ARPGX2
Influenza A by PCR: NEGATIVE
Influenza B by PCR: NEGATIVE
SARS Coronavirus 2 by RT PCR: NEGATIVE

## 2021-02-26 LAB — CBC WITH DIFFERENTIAL/PLATELET
Abs Immature Granulocytes: 0.38 10*3/uL — ABNORMAL HIGH (ref 0.00–0.07)
Basophils Absolute: 0 10*3/uL (ref 0.0–0.1)
Basophils Relative: 0 %
Eosinophils Absolute: 0 10*3/uL (ref 0.0–0.5)
Eosinophils Relative: 0 %
HCT: 32.2 % — ABNORMAL LOW (ref 36.0–46.0)
Hemoglobin: 10.9 g/dL — ABNORMAL LOW (ref 12.0–15.0)
Immature Granulocytes: 2 %
Lymphocytes Relative: 5 %
Lymphs Abs: 1.1 10*3/uL (ref 0.7–4.0)
MCH: 22.1 pg — ABNORMAL LOW (ref 26.0–34.0)
MCHC: 33.9 g/dL (ref 30.0–36.0)
MCV: 65.3 fL — ABNORMAL LOW (ref 80.0–100.0)
Monocytes Absolute: 1.1 10*3/uL — ABNORMAL HIGH (ref 0.1–1.0)
Monocytes Relative: 5 %
Neutro Abs: 17.6 10*3/uL — ABNORMAL HIGH (ref 1.7–7.7)
Neutrophils Relative %: 88 %
Platelets: 387 10*3/uL (ref 150–400)
RBC: 4.93 MIL/uL (ref 3.87–5.11)
RDW: 20.5 % — ABNORMAL HIGH (ref 11.5–15.5)
WBC: 20.2 10*3/uL — ABNORMAL HIGH (ref 4.0–10.5)
nRBC: 0 % (ref 0.0–0.2)

## 2021-02-26 LAB — BASIC METABOLIC PANEL
Anion gap: 8 (ref 5–15)
BUN: 12 mg/dL (ref 6–20)
CO2: 31 mmol/L (ref 22–32)
Calcium: 9.1 mg/dL (ref 8.9–10.3)
Chloride: 93 mmol/L — ABNORMAL LOW (ref 98–111)
Creatinine, Ser: 0.83 mg/dL (ref 0.44–1.00)
GFR, Estimated: >60 mL/min (ref >60)
Glucose, Bld: 79 mg/dL (ref 70–99)
Potassium: 2.7 mmol/L — CL (ref 3.5–5.1)
Sodium: 132 mmol/L — ABNORMAL LOW (ref 135–145)

## 2021-02-26 LAB — RAPID URINE DRUG SCREEN, HOSP PERFORMED
Amphetamines: NOT DETECTED
Barbiturates: NOT DETECTED
Benzodiazepines: NOT DETECTED
Cocaine: POSITIVE — AB
Opiates: NOT DETECTED
Tetrahydrocannabinol: NOT DETECTED

## 2021-02-26 LAB — MAGNESIUM: Magnesium: 1.7 mg/dL (ref 1.7–2.4)

## 2021-02-26 LAB — PREGNANCY, URINE: Preg Test, Ur: NEGATIVE

## 2021-02-26 LAB — LACTIC ACID, PLASMA
Lactic Acid, Venous: 2.4 mmol/L (ref 0.5–1.9)
Lactic Acid, Venous: 1.6 mmol/L (ref 0.5–1.9)

## 2021-02-26 LAB — LIPASE, BLOOD: Lipase: 27 U/L (ref 11–51)

## 2021-02-26 LAB — PHOSPHORUS: Phosphorus: 3.9 mg/dL (ref 2.5–4.6)

## 2021-02-26 MED ORDER — SUCRALFATE 1 G PO TABS
1.0000 g | ORAL_TABLET | Freq: Once | ORAL | Status: AC
  Administered 2021-02-26: 1 g via ORAL
  Filled 2021-02-26: qty 1

## 2021-02-26 MED ORDER — ACETAMINOPHEN 325 MG PO TABS
650.0000 mg | ORAL_TABLET | Freq: Four times a day (QID) | ORAL | Status: DC | PRN
  Administered 2021-02-26: 650 mg via ORAL
  Filled 2021-02-26: qty 2

## 2021-02-26 MED ORDER — POTASSIUM CHLORIDE IN NACL 20-0.9 MEQ/L-% IV SOLN
INTRAVENOUS | Status: AC
  Filled 2021-02-26 (×4): qty 1000

## 2021-02-26 MED ORDER — IOHEXOL 350 MG/ML SOLN
80.0000 mL | Freq: Once | INTRAVENOUS | Status: AC | PRN
  Administered 2021-02-26: 80 mL via INTRAVENOUS

## 2021-02-26 MED ORDER — CLONIDINE HCL 0.1 MG PO TABS
0.1000 mg | ORAL_TABLET | Freq: Once | ORAL | Status: AC
  Administered 2021-02-26: 0.1 mg via ORAL
  Filled 2021-02-26: qty 1

## 2021-02-26 MED ORDER — ONDANSETRON HCL 4 MG/2ML IJ SOLN: 4.0000 mg | Freq: Four times a day (QID) | INTRAMUSCULAR | Status: DC | PRN

## 2021-02-26 MED ORDER — POTASSIUM CHLORIDE 10 MEQ/100ML IV SOLN
10.0000 meq | Freq: Once | INTRAVENOUS | Status: AC
  Administered 2021-02-26: 10 meq via INTRAVENOUS
  Filled 2021-02-26: qty 100

## 2021-02-26 MED ORDER — MAGNESIUM SULFATE 2 GM/50ML IV SOLN
2.0000 g | Freq: Once | INTRAVENOUS | Status: AC
  Administered 2021-02-26: 2 g via INTRAVENOUS
  Filled 2021-02-26: qty 50

## 2021-02-26 MED ORDER — ONDANSETRON HCL 4 MG PO TABS: 4.0000 mg | ORAL_TABLET | Freq: Four times a day (QID) | ORAL | Status: DC | PRN

## 2021-02-26 MED ORDER — SODIUM CHLORIDE 0.9 % IV SOLN
2.0000 g | Freq: Every day | INTRAVENOUS | Status: DC
  Administered 2021-02-26 – 2021-03-01 (×4): 2 g via INTRAVENOUS
  Filled 2021-02-26 (×5): qty 20

## 2021-02-26 MED ORDER — HYDROMORPHONE HCL 1 MG/ML IJ SOLN
1.0000 mg | Freq: Once | INTRAMUSCULAR | Status: AC
  Administered 2021-02-26: 1 mg via INTRAVENOUS
  Filled 2021-02-26: qty 1

## 2021-02-26 MED ORDER — CLONIDINE HCL 0.2 MG PO TABS
0.2000 mg | ORAL_TABLET | Freq: Once | ORAL | Status: AC
  Administered 2021-02-26: 0.2 mg via ORAL
  Filled 2021-02-26: qty 1

## 2021-02-26 MED ORDER — FAMOTIDINE IN NACL 20-0.9 MG/50ML-% IV SOLN
20.0000 mg | Freq: Once | INTRAVENOUS | Status: AC
  Administered 2021-02-26: 20 mg via INTRAVENOUS
  Filled 2021-02-26: qty 50

## 2021-02-26 MED ORDER — PIPERACILLIN-TAZOBACTAM 3.375 G IVPB 30 MIN
3.3750 g | Freq: Once | INTRAVENOUS | Status: AC
  Administered 2021-02-26: 3.375 g via INTRAVENOUS
  Filled 2021-02-26: qty 50

## 2021-02-26 MED ORDER — SODIUM CHLORIDE (PF) 0.9 % IJ SOLN
INTRAMUSCULAR | Status: AC
  Filled 2021-02-26: qty 50

## 2021-02-26 MED ORDER — POTASSIUM CHLORIDE IN NACL 40-0.9 MEQ/L-% IV SOLN
INTRAVENOUS | Status: AC
  Filled 2021-02-26: qty 1000

## 2021-02-26 MED ORDER — HYDROMORPHONE HCL 1 MG/ML IJ SOLN
1.0000 mg | INTRAMUSCULAR | Status: DC | PRN
Start: 2021-02-26 — End: 2021-02-26
  Administered 2021-02-26: 1 mg via INTRAVENOUS
  Filled 2021-02-26 (×3): qty 1

## 2021-02-26 MED ORDER — HYDROMORPHONE HCL 1 MG/ML IJ SOLN
1.0000 mg | INTRAMUSCULAR | Status: DC | PRN
  Administered 2021-02-26 – 2021-03-01 (×13): 1 mg via INTRAVENOUS
  Filled 2021-02-26 (×13): qty 1

## 2021-02-26 MED ORDER — SODIUM CHLORIDE 0.9 % IV BOLUS
1000.0000 mL | Freq: Once | INTRAVENOUS | Status: AC
  Administered 2021-02-26: 1000 mL via INTRAVENOUS

## 2021-02-26 MED ORDER — FENTANYL CITRATE PF 50 MCG/ML IJ SOSY
50.0000 ug | PREFILLED_SYRINGE | Freq: Once | INTRAMUSCULAR | Status: AC
Start: 2021-02-26 — End: 2021-02-26
  Administered 2021-02-26: 50 ug via INTRAVENOUS
  Filled 2021-02-26: qty 1

## 2021-02-26 MED ORDER — ACETAMINOPHEN 650 MG RE SUPP: 650.0000 mg | Freq: Four times a day (QID) | RECTAL | Status: DC | PRN

## 2021-02-26 MED ORDER — FENTANYL CITRATE PF 50 MCG/ML IJ SOSY
50.0000 ug | PREFILLED_SYRINGE | Freq: Once | INTRAMUSCULAR | Status: AC
  Administered 2021-02-26: 50 ug via INTRAVENOUS
  Filled 2021-02-26: qty 1

## 2021-02-26 MED ORDER — OXYCODONE HCL 5 MG PO TABS
5.0000 mg | ORAL_TABLET | ORAL | Status: DC | PRN
Start: 2021-02-26 — End: 2021-02-27
  Administered 2021-02-26 – 2021-02-27 (×3): 5 mg via ORAL
  Filled 2021-02-26 (×4): qty 1

## 2021-02-26 MED ORDER — ENOXAPARIN SODIUM 40 MG/0.4ML IJ SOSY
40.0000 mg | PREFILLED_SYRINGE | Freq: Every day | INTRAMUSCULAR | Status: DC
  Filled 2021-02-26: qty 0.4

## 2021-02-26 MED ORDER — FENTANYL CITRATE PF 50 MCG/ML IJ SOSY
25.0000 ug | PREFILLED_SYRINGE | Freq: Once | INTRAMUSCULAR | Status: AC
  Administered 2021-02-26: 25 ug via INTRAVENOUS
  Filled 2021-02-26: qty 1

## 2021-02-26 MED ORDER — NICOTINE 21 MG/24HR TD PT24
21.0000 mg | MEDICATED_PATCH | Freq: Every day | TRANSDERMAL | Status: DC
  Administered 2021-02-26 – 2021-03-01 (×4): 21 mg via TRANSDERMAL
  Filled 2021-02-26 (×4): qty 1

## 2021-02-26 MED ORDER — LACTATED RINGERS IV BOLUS (SEPSIS): 1000.0000 mL | Freq: Once | INTRAVENOUS | Status: DC

## 2021-02-26 NOTE — ED Provider Notes (Signed)
Assumed care at change of shift, see prior note for complete H&P. Briefly, prior gastric ulcer, admitted and left AMA (throughout August) without her medications. Not on medications. Today with worsening abdominal pain. WBC 20, fevers over weekend.  On Zosyn, cultures obtained. Likely admission once CT read.  Physical Exam  BP (!) 165/97   Pulse 64   Temp 98.9 F (37.2 C) (Oral)   Resp 17   Ht 5\' 9"  (1.753 m)   SpO2 100%   BMI 20.67 kg/m   Physical Exam  ED Course/Procedures     Procedures  MDM  Found to have pyelonephritis on CT scan.  Discussed results with patient who is agreeable with plan for admission.  Hospitalist paged for consult. Patient questing additional pain medication for abdominal and back pain.   Discussed with Dr. Olevia Bowens with [facility] will consult for admission.   [name], PA-C 02/26/21 0731    Hayden Rasmussen, MD 02/26/21 [phone]

## 2021-02-26 NOTE — Progress Notes (Addendum)
[facility] admitting physician addendum.  I the nursing staff reports the patient is not having satisfactory pain control.  Oxycodone 5 mg every 4 hours as needed for breakthrough/moderate pain ordered.  Hydromorphone 1 mg IVP every 3 hours as needed was changed to every 4 hours for severe pain only.  Tennis Must, MD.  Addendum:  A 21 mg NicoDerm patch for nicotine withdrawal symptoms added to her MAR.

## 2021-02-26 NOTE — H&P (Signed)
History and Physical    Terri [name] WLN:[id number] DOB: 11-Nov-1975 DOA: 02/25/2021  PCP: Patient, No Pcp Per (Inactive)   Patient coming from: Home.  I have personally briefly reviewed patient's old medical records in [place]  Chief Complaint: Stomach pain.  HPI: Terri [name] is a 45 y.o. 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 coli bacteremia who is coming to the emergency department due to 3 to 4 days of progressively worse left flank pain, subjective fever episode 2 days ago, chills, fatigue and malaise.  No dysuria, frequency or hematuria.  He denied emesis, diarrhea, constipation, melena or hematochezia.  She also has chronic abdominal pain which was exacerbated by cocaine use 3 to 4 days ago.  Denied rhinorrhea, sore throat, dyspnea, wheezing or hemoptysis.  No chest pain, palpitations, diaphoresis, PND, orthopnea or pitting edema of the lower extremities.  No polyuria, polydipsia, polyphagia or blurred vision.  ED Course: Initial vital signs were temperature 98.9 F, pulse 98, respirations 16, BP 158/116 mmHg and O2 sat 100% on room air.  The patient received 1000 mL NS bolus, 3.375 g of Zosyn IVPB, KCl 10 mEq IVPB x1, hydromorphone 1 mg IVPB x2, fentanyl 25 mcg IVPB x1 and famotidine 20 mg IVPB x1.  Lab work: Her urinalysis showed small hemoglobinuria, moderate leukocyte esterase and 30 mg slope proteinuria.  There were 21-50 WBC and rare bacteria on microscopic examination.  UDS was positive for cocaine.  Lactic acid was 2.4 and then 1.6 mmol/L.  CBC showed a white count of 20.2 with 88% neutrophils, hemoglobin 10.9 g/dL and platelets 387.  CMP showed a potassium of 2.9 and chloride of 97 mmol/L.  Glucose 115  mg/dL and albumin 3.0 g/dL.  The rest of the CMP values were otherwise unremarkable.  Lipase, urine pregnancy test and coronavirus/influenza PCR were negative.  Imaging: No acute cardiopulmonary disease is found on chest radiograph.  CT abdomen/pelvis with contrast showed an acute left pyelonephritis with no associated renal abscess or other complicating features.  There was new moderate volume pelvic free fluid since August.  This may be related to anasarca.  There was no discrete bowel inflammation.  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  CT chest with contrast did not show any acute abnormality.  Please see images and full radiology report for further details.  Review of Systems: As per HPI otherwise all other systems reviewed and are negative.  Past Medical History:  Diagnosis Date   Arthritis    Back and legs   Bipolar disorder (HCC)    Chronic back pain    Depression    DVT (deep venous thrombosis) (HCC)    early 20's leg   History of blood transfusion    Hypertension    Migraine headache    Neuropathy    Pneumonia    Positive PPD    x 2 last time 09/2017   Sciatica    Stress incontinence    Past Surgical History:  Procedure Laterality Date   ALVEOLOPLASTY Bilateral 01/31/2018   Procedure: ALVEOLOPLASTY;  Surgeon: Diona Browner, DDS;  Location: [facility];  Service: Oral Surgery;  Laterality: Bilateral;   ANKLE SURGERY Left 1992   with hardware   BACK SURGERY     BIOPSY  02/08/2019   Procedure: BIOPSY;  Surgeon: Rush Landmark Telford Nab., MD;  Location: [facility];  Service: Gastroenterology;;   CESAREAN SECTION     CHOLECYSTECTOMY N/A 02/15/2017   Procedure: LAPAROSCOPIC CHOLECYSTECTOMY;  Surgeon: Clovis Riley, MD;  Location: [facility];  Service: General;  Laterality: N/A;   ENTEROSCOPY N/A 02/08/2019   Procedure: ENTEROSCOPY;  Surgeon: Rush Landmark Telford Nab., MD;  [facility];  Service: Gastroenterology;  Laterality: N/A;    ESOPHAGOGASTRODUODENOSCOPY N/A 01/17/2021   Procedure: ESOPHAGOGASTRODUODENOSCOPY (EGD);  Surgeon: Juanita Craver, MD;  Location: [facility];  Service: Endoscopy;  Laterality: N/A;   ESOPHAGOGASTRODUODENOSCOPY (EGD) WITH PROPOFOL N/A 09/15/2017   Procedure: ESOPHAGOGASTRODUODENOSCOPY (EGD) WITH PROPOFOL;  Surgeon: Clarene Essex, MD;  [facility];  Service: Endoscopy;  Laterality: N/A;   ESOPHAGOGASTRODUODENOSCOPY (EGD) WITH PROPOFOL N/A 02/08/2019   Procedure: ESOPHAGOGASTRODUODENOSCOPY (EGD) WITH PROPOFOL;  Surgeon: Rush Landmark Telford Nab., MD;  [facility];  Service: Gastroenterology;  Laterality: N/A;   GASTRIC BYPASS     INCISION AND DRAINAGE OF PERITONSILLAR ABCESS Left 08/13/2017   Procedure: INCISION AND DRAINAGE OF LEFT NECK ABSCESS;  Surgeon: Melida Quitter, MD;  Location: [facility];  Service: ENT;  Laterality: Left;   LAPAROSCOPIC LYSIS OF ADHESIONS  02/15/2017   Procedure: LAPAROSCOPIC LYSIS OF ADHESIONS;  Surgeon: Clovis Riley, MD;  [facility];  Service: General;;   LUMBAR DISC SURGERY     LUMBAR FUSION     SAVORY DILATION N/A 02/08/2019   Procedure: SAVORY DILATION;  Surgeon: Irving Copas., MD;  [facility];  Service: Gastroenterology;  Laterality: N/A;   TOOTH EXTRACTION Bilateral 01/31/2018   Procedure: DENTAL RESTORATION/EXTRACTIONS;  Surgeon: Diona Browner, DDS;  Location: [facility];  Service: Oral Surgery;  Laterality: Bilateral;   TUBAL LIGATION     UPPER GI ENDOSCOPY N/A 02/15/2017   Procedure: UPPER GI ENDOSCOPY;  Surgeon: Clovis Riley, MD;  Location: [facility];  Service: General;  Laterality: N/A;   Social History  reports that she has been smoking cigarettes. She has a 29.00 pack-year smoking history. She has never used smokeless tobacco. She reports current drug use. Drugs: IV and Cocaine. She reports that she does not drink alcohol.  Allergies  Allergen Reactions   Ketorolac Tromethamine Itching and Nausea And Vomiting    Methocarbamol Diarrhea   Family History  Problem Relation Age of Onset   Diabetes Mother    Hypertension Mother    Diabetes Father    Hypertension Father    Prior to Admission medications   Medication Sig Start Date End Date Taking? Authorizing Provider  acetaminophen (TYLENOL) 500 MG tablet Take 1 tablet (500 mg total) by mouth every 6 (six) hours as needed for moderate pain. Patient not taking: No sig reported 01/31/18   Diona Browner, DMD   Physical Exam: Vitals:   02/26/21 0500 02/26/21 0600 02/26/21 0710 02/26/21 0714  BP: (!) 146/105 (!) 165/97 (!) 165/106 (!) 165/106  Pulse: 67 64 71 83  Resp: 16 17 18 16   Temp:      TempSrc:      SpO2: 99% 100% 95% 99%  Height:       Constitutional: NAD, calm, comfortable Eyes: PERRL, lids and conjunctivae normal ENMT: Mucous membranes are mildly dry.  Posterior pharynx clear of any exudate or lesions. Neck: normal, supple, no masses, no thyromegaly Respiratory: clear to auscultation bilaterally, no wheezing, no crackles. Normal respiratory effort. No accessory muscle use.  Cardiovascular: Regular rate and rhythm, no murmurs / rubs / gallops. No extremity edema. 2+ pedal pulses. No  carotid bruits.  Abdomen: No distention. Bowel sounds positive.  Soft, positive epigastric and right flank tenderness without guarding or rebound, positive left CVA.  No masses palpated. No hepatosplenomegaly. Musculoskeletal: no clubbing / cyanosis. Good ROM, no contractures. Normal muscle tone.  Skin: no acute rashes, lesions, ulcers on very limited dermatological examination. Neurologic: CN 2-12 grossly intact. Sensation intact, DTR normal. Strength 5/5 in all 4.  Psychiatric: Alert and oriented x 3.  Anxious mood because of pain intensity.  Labs on Admission: I have personally reviewed following labs and imaging studies  CBC: Recent Labs  Lab 02/25/21 2349  WBC 20.2*  NEUTROABS 17.6*  HGB 10.9*  HCT 32.2*  MCV 65.3*  PLT 220   Basic Metabolic  Panel: Recent Labs  Lab 02/25/21 2349  NA 136  K 2.9*  CL 97*  CO2 29  GLUCOSE 115*  BUN 11  CREATININE 0.80  CALCIUM 9.0   GFR: CrCl cannot be calculated (Unknown ideal weight.).  Liver Function Tests: Recent Labs  Lab 02/25/21 2349  AST 17  ALT 14  ALKPHOS 108  BILITOT 1.0  PROT 6.7  ALBUMIN 3.0*   Urine analysis:    Component Value Date/Time   COLORURINE YELLOW 02/26/2021 0522   APPEARANCEUR HAZY (A) 02/26/2021 0522   LABSPEC 1.011 02/26/2021 0522   PHURINE 6.0 02/26/2021 0522   GLUCOSEU NEGATIVE 02/26/2021 0522   HGBUR SMALL (A) 02/26/2021 0522   BILIRUBINUR NEGATIVE 02/26/2021 0522   KETONESUR NEGATIVE 02/26/2021 0522   PROTEINUR 30 (A) 02/26/2021 0522   UROBILINOGEN 1.0 02/09/2015 1228   NITRITE NEGATIVE 02/26/2021 0522   LEUKOCYTESUR MODERATE (A) 02/26/2021 0522   Radiological Exams on Admission: CT Chest W Contrast  Result Date: 02/26/2021 CLINICAL DATA:  45 year old female with increasing upper abdominal pain. EXAM: CT CHEST, ABDOMEN, AND PELVIS WITH CONTRAST TECHNIQUE: Multidetector CT imaging of the chest, abdomen and pelvis was performed following the standard protocol during bolus administration of intravenous contrast. CONTRAST:  70mL OMNIPAQUE IOHEXOL 350 MG/ML SOLN COMPARISON:  CT Abdomen and Pelvis 01/17/2021 and earlier. Chest radiograph 0556 hours today. Neck CT 09/25/2017. FINDINGS: CT CHEST FINDINGS Cardiovascular: Calcified aortic atherosclerosis. No cardiomegaly or pericardial effusion. Calcified coronary artery atherosclerosis is widespread (series 2, image 30). The central pulmonary arteries are enhanced and appear to be patent. Mediastinum/Nodes: Negative. No mediastinal mass or lymphadenopathy. Lungs/Pleura: Major airways are patent with mild bilateral bronchial wall thickening. Occasional subpleural lung scarring (anterior right upper lobe series 5, image 42). Patchy dependent opacity in the costophrenic angles more resembles atelectasis than  infection. There is questionable early centrilobular emphysema in the upper lobes. No pleural effusion or other abnormal pulmonary opacity. Musculoskeletal: No acute or suspicious osseous lesion in the chest. CT ABDOMEN PELVIS FINDINGS Hepatobiliary: Chronically absent gallbladder. Stable liver with mild postoperative bile duct enlargement. Pancreas: Stable pancreatic atrophy. Spleen: Stable mild splenomegaly. Adrenals/Urinary Tract: New heterogeneous enhancement of the left kidney (series 12, image 64) typical for acute pyelonephritis. No left hydronephrosis. Right renal enhancement appears stable and within normal limits. Adrenal glands remain normal. On the delayed images there is fairly symmetric contrast excretion from both kidneys. No discrete pararenal fluid collection. There is mild urothelial thickening at the left renal pelvis. Unremarkable bladder. Stomach/Bowel: Generalized mesenteric edema throughout the abdomen and pelvis. No dilated loops. 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 No discrete bowel inflammation or free air identified. Evidence of a normal gas containing appendix on coronal image 86. Vascular/Lymphatic: Advanced Aortoiliac calcified  atherosclerosis. Major arterial structures remain patent. Portal venous system is inadequately opacified but the main portal vein does appear to be patent. No lymphadenopathy identified. Reproductive: Negative.  Numerous pelvic phleboliths. Other: New pelvic free fluid, moderate volume with simple fluid density (series 2, image 114). Generalized body wall edema has not significantly changed since August. Musculoskeletal: Age advanced lumbar spine degeneration with previous posterior lumbar decompression. Advanced chronic disc and endplate degeneration from L3 to the sacrum. No acute osseous abnormality identified. IMPRESSION: 1. Acute Left Pyelonephritis. No associated renal abscess or other complicating  features. 2. New moderate volume pelvic free fluid since August. This is nonspecific, but perhaps related to anasarca given the appearance of continued body wall edema and new generalized mesenteric congestion since August. 3. No discrete bowel inflammation is evident. 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 4. No acute or inflammatory process identified in the chest. No pleural effusion. 5. Advanced calcified coronary artery and Aortic Atherosclerosis (ICD10-I70.0). Electronically Signed   By: Genevie Ann M.D.   On: 02/26/2021 07:08   CT Abdomen Pelvis W Contrast  Result Date: 02/26/2021 CLINICAL DATA:  45 year old female with increasing upper abdominal pain. EXAM: CT CHEST, ABDOMEN, AND PELVIS WITH CONTRAST TECHNIQUE: Multidetector CT imaging of the chest, abdomen and pelvis was performed following the standard protocol during bolus administration of intravenous contrast. CONTRAST:  39mL OMNIPAQUE IOHEXOL 350 MG/ML SOLN COMPARISON:  CT Abdomen and Pelvis 01/17/2021 and earlier. Chest radiograph 0556 hours today. Neck CT 09/25/2017. FINDINGS: CT CHEST FINDINGS Cardiovascular: Calcified aortic atherosclerosis. No cardiomegaly or pericardial effusion. Calcified coronary artery atherosclerosis is widespread (series 2, image 30). The central pulmonary arteries are enhanced and appear to be patent. Mediastinum/Nodes: Negative. No mediastinal mass or lymphadenopathy. Lungs/Pleura: Major airways are patent with mild bilateral bronchial wall thickening. Occasional subpleural lung scarring (anterior right upper lobe series 5, image 42). Patchy dependent opacity in the costophrenic angles more resembles atelectasis than infection. There is questionable early centrilobular emphysema in the upper lobes. No pleural effusion or other abnormal pulmonary opacity. Musculoskeletal: No acute or suspicious osseous lesion in the chest. CT ABDOMEN PELVIS FINDINGS Hepatobiliary: Chronically absent gallbladder.  Stable liver with mild postoperative bile duct enlargement. Pancreas: Stable pancreatic atrophy. Spleen: Stable mild splenomegaly. Adrenals/Urinary Tract: New heterogeneous enhancement of the left kidney (series 12, image 64) typical for acute pyelonephritis. No left hydronephrosis. Right renal enhancement appears stable and within normal limits. Adrenal glands remain normal. On the delayed images there is fairly symmetric contrast excretion from both kidneys. No discrete pararenal fluid collection. There is mild urothelial thickening at the left renal pelvis. Unremarkable bladder. Stomach/Bowel: Generalized mesenteric edema throughout the abdomen and pelvis. No dilated loops. 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 No discrete bowel inflammation or free air identified. Evidence of a normal gas containing appendix on coronal image 86. Vascular/Lymphatic: Advanced Aortoiliac calcified atherosclerosis. Major arterial structures remain patent. Portal venous system is inadequately opacified but the main portal vein does appear to be patent. No lymphadenopathy identified. Reproductive: Negative.  Numerous pelvic phleboliths. Other: New pelvic free fluid, moderate volume with simple fluid density (series 2, image 114). Generalized body wall edema has not significantly changed since August. Musculoskeletal: Age advanced lumbar spine degeneration with previous posterior lumbar decompression. Advanced chronic disc and endplate degeneration from L3 to the sacrum. No acute osseous abnormality identified. IMPRESSION: 1. Acute Left Pyelonephritis. No associated renal abscess or other complicating features. 2. New moderate volume pelvic free fluid  since August. This is nonspecific, but perhaps related to anasarca given the appearance of continued body wall edema and new generalized mesenteric congestion since August. 3. No discrete bowel inflammation is evident. Previous  Roux-en-Y type gastric bypass with no bowel obstruction. 4. No acute or inflammatory process identified in the chest. No pleural effusion. 5. Advanced calcified coronary artery and Aortic Atherosclerosis (ICD10-I70.0). Electronically Signed   By: Genevie Ann M.D.   On: 02/26/2021 07:08   DG Chest Port 1 View  Result Date: 02/26/2021 CLINICAL DATA:  45 year old female with history of fever and worsening upper abdominal pain. EXAM: PORTABLE CHEST 1 VIEW COMPARISON:  Chest x-ray 09/17/2020. FINDINGS: Skin fold artifacts are noted bilaterally. Lung volumes are normal. No consolidative airspace disease. No pleural effusions. No pneumothorax. No pulmonary nodule or mass noted. Pulmonary vasculature and the cardiomediastinal silhouette are within normal limits. IMPRESSION: 1.  No radiographic evidence of acute cardiopulmonary disease. Electronically Signed   By: Vinnie Langton M.D.   On: 02/26/2021 06:13    EKG: Independently reviewed.  Vent. rate 71 BPM PR interval 109 ms QRS duration 90 ms QT/QTcB 392/426 ms P-R-T axes 84 81 * Sinus rhythm Short PR interval Consider left ventricular hypertrophy Borderline T abnormalities, lateral leads  Assessment/Plan Principal Problem:   Acute pyelonephritis Observation/telemetry. Continue IV fluids. Analgesics as needed. Antiemetics as needed. Continue ceftriaxone 2 g every 24 hours. Follow-up blood culture and sensitivity. Follow-up urine culture and sensitivity.  Active Problems:   Hypokalemia Replacing. Magnesium was supplemented. Follow-up potassium level in the morning.    Cocaine abuse ([place]) Cardiovascular disease risk briefly explained. Consult TOC team. Follow-up with behavioral health and PCP.    Microcytic anemia Monitor hematocrit and hemoglobin. Transfuse as needed.    Mild protein malnutrition ([place]) Needs to improve nutrition. Protein supplementation.     DVT prophylaxis: Lovenox SQ. Code Status:   Full code. Family  Communication:   Disposition Plan:   Patient is from:  Home.  Anticipated DC to:  Home.  Anticipated DC date:  02/28/2021.  Anticipated DC barriers: Clinical status.  Consults called:   Admission status:  Observation/telemetry.   Severity of Illness: High severity after presenting with abdominal pain with nausea in the setting of acute left pyelonephritis and cocaine abuse.  The patient will remain in the hospital for IV hydration, symptoms treatment and IV antibiotic therapy pending culture results.  Reubin Milan MD [facility]  How to contact the [facility] Attending or Consulting provider [place] or covering provider during after hours [place], for this patient?   Check the care team in [facility] and look for a) attending/consulting [facility] provider listed and b) the [facility] team listed Log into [email or web address] and use [place] universal password to access. If you do not have the password, please contact the hospital operator. Locate the [facility] provider you are looking for under [facility] and page to a number that you can be directly reached. If you still have difficulty reaching the provider, please page the [facility] (Director on Call) for the Hospitalists listed on amion for assistance.  02/26/2021, 8:03 AM   This document was prepared using Dragon voice recognition software and may contain some unintended transcription errors.

## 2021-02-26 NOTE — ED Provider Notes (Signed)
[facility] DEPT Provider Note   CSN: [id number] Arrival date & time: 02/25/21  2333     History Chief Complaint  Patient presents with   Abdominal Pain   Chest Pain    RHEANNON CERNEY is a 45 y.o. female with Pmhx HTN, bipolar disorder, chronic back pain, polysubstance abuse who presents to the ED today via EMS with complaint of sudden onset, worsening, diffuse abdominal pain that began earlier tonight. Pt was recently admitted to the hospital for gastric ulcer. She had an EGD done (see findings below). Pt unfortunately left prior to being discharged and was not provided with any medications. She reports she has been dealing with the pain at home however tonight it became more severe prompting ED visit. She also mentions having fevers over the weekend with tmax 102.0. She complains of nausea however denies vomiting. No diarrhea or constipation.   EGD 08/27 Findings: Evidence of a Roux-en-Y gastrojejunostomy was found; there was a giant ulcer noted at the anastamosis with some debris in thew gastric pouch; visualization was limited; the duodenum-to-jejunum limb was examined 10 cm from the anastomosis appeared normal. - Normal appearing, widely patent esophagus and GEJ. - Roux-en-Y gastrojejunostomy with a giant ulcer at gastrojejunal anastomosis without evidence of hemorrhage or ulceration. - No specimens collected.   The history is provided by the patient and medical records.      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   Persistent fever 02/09/2019   COVID-19 virus not detected    Chest pain    Bacteremia due to Escherichia coli    Esophagitis    Hypokalemia    Hypomagnesemia    Sepsis ([place]) 02/02/2019   Leucocytosis 02/02/2019   Thrombocytosis 02/02/2019   UTI (urinary tract infection) 02/02/2019   GIB (gastrointestinal bleeding) 09/13/2017   Apnea 09/13/2017   Essential hypertension    Cellulitis and abscess of neck 08/12/2017   Cellulitis of submandibular region 08/10/2017   Odontogenic infection of jaw 08/10/2017   Cholecystitis 02/14/2017   Opioid abuse with opioid-induced mood disorder ([place]) 01/25/2017   Anxiety 02/11/2014   Depression 07/12/2012    Past Surgical History:  Procedure Laterality Date   ALVEOLOPLASTY Bilateral 01/31/2018   Procedure: ALVEOLOPLASTY;  Surgeon: Diona Browner, DDS;  Location: [facility];  Service: Oral Surgery;  Laterality: Bilateral;   ANKLE SURGERY Left 1992   with hardware  BACK SURGERY     BIOPSY  02/08/2019   Procedure: BIOPSY;  Surgeon: Rush Landmark Telford Nab., MD;  Location: [facility];  Service: Gastroenterology;;   CESAREAN SECTION     CHOLECYSTECTOMY N/A 02/15/2017   Procedure: LAPAROSCOPIC CHOLECYSTECTOMY;  Surgeon: Clovis Riley, MD;  Location: [facility];  Service: General;  Laterality: N/A;   ENTEROSCOPY N/A 02/08/2019   Procedure: ENTEROSCOPY;  Surgeon: Rush Landmark Telford Nab., MD;  Location: [facility];  Service: Gastroenterology;   Laterality: N/A;   ESOPHAGOGASTRODUODENOSCOPY N/A 01/17/2021   Procedure: ESOPHAGOGASTRODUODENOSCOPY (EGD);  Surgeon: Juanita Craver, MD;  Location: [facility];  Service: Endoscopy;  Laterality: N/A;   ESOPHAGOGASTRODUODENOSCOPY (EGD) WITH PROPOFOL N/A 09/15/2017   Procedure: ESOPHAGOGASTRODUODENOSCOPY (EGD) WITH PROPOFOL;  Surgeon: Clarene Essex, MD;  [facility];  Service: Endoscopy;  Laterality: N/A;   ESOPHAGOGASTRODUODENOSCOPY (EGD) WITH PROPOFOL N/A 02/08/2019   Procedure: ESOPHAGOGASTRODUODENOSCOPY (EGD) WITH PROPOFOL;  Surgeon: Rush Landmark Telford Nab., MD;  [facility];  Service: Gastroenterology;  Laterality: N/A;   GASTRIC BYPASS     INCISION AND DRAINAGE OF PERITONSILLAR ABCESS Left 08/13/2017   Procedure: INCISION AND DRAINAGE OF LEFT NECK ABSCESS;  Surgeon: Melida Quitter, MD;  Location: [facility];  Service: ENT;  Laterality: Left;   LAPAROSCOPIC LYSIS OF ADHESIONS  02/15/2017   Procedure: LAPAROSCOPIC LYSIS OF ADHESIONS;  Surgeon: Clovis Riley, MD;  Location[facility];  Service: General;;   LUMBAR DISC SURGERY     LUMBAR FUSION     SAVORY DILATION N/A 02/08/2019   Procedure: SAVORY DILATION;  Surgeon: Irving Copas., MD;  [facility];  Service: Gastroenterology;  Laterality: N/A;   TOOTH EXTRACTION Bilateral 01/31/2018   Procedure: DENTAL RESTORATION/EXTRACTIONS;  Surgeon: Diona Browner, DDS;  Location: [facility];  Service: Oral Surgery;  Laterality: Bilateral;   TUBAL LIGATION     UPPER GI ENDOSCOPY N/A 02/15/2017   Procedure: UPPER GI ENDOSCOPY;  Surgeon: Clovis Riley, MD;  Location: [facility];  Service: General;  Laterality: N/A;     OB History   No obstetric history on file.     Family History  Problem Relation Age of Onset   Diabetes Mother    Hypertension Mother    Diabetes Father    Hypertension Father     Social History   Tobacco Use   Smoking status: Every Day    Packs/day: 1.00    Years: 29.00    Pack years: 29.00     Types: Cigarettes   Smokeless tobacco: Never  Vaping Use   Vaping Use: Never used  Substance Use Topics   Alcohol use: No   Drug use: Yes    Types: IV, Cocaine    Home Medications Prior to Admission medications   Medication Sig Start Date End Date Taking? Authorizing Provider  acetaminophen (TYLENOL) 500 MG tablet Take 1 tablet (500 mg total) by mouth every 6 (six) hours as needed for moderate pain. Patient not taking: No sig reported 01/31/18   Diona Browner, DMD    Allergies    Ketorolac tromethamine and Methocarbamol  Review of Systems   Review of Systems  Constitutional:  Positive for fever.  Gastrointestinal:  Positive for abdominal pain and nausea. Negative for constipation, diarrhea and vomiting.  Genitourinary:  Negative for dysuria, flank pain and frequency.  All other systems reviewed and are negative.  Physical Exam Updated Vital Signs BP (!) 165/97   Pulse 64   Temp 98.9 F (37.2 C) (Oral)   Resp 17   Ht 5'  9" (1.753 m)   SpO2 100%   BMI 20.67 kg/m   Physical Exam Vitals and nursing note reviewed.  Constitutional:      Appearance: She is not ill-appearing or diaphoretic.  HENT:     Head: Normocephalic and atraumatic.  Eyes:     Conjunctiva/sclera: Conjunctivae normal.  Cardiovascular:     Rate and Rhythm: Normal rate and regular rhythm.     Heart sounds: Normal heart sounds.  Pulmonary:     Effort: Pulmonary effort is normal.     Breath sounds: Normal breath sounds. No wheezing, rhonchi or rales.  Abdominal:     General: Bowel sounds are normal.     Palpations: Abdomen is soft.     Tenderness: There is generalized abdominal tenderness. There is guarding (voluntary). There is no rebound.  Musculoskeletal:     Cervical back: Neck supple.  Skin:    General: Skin is warm and dry.  Neurological:     Mental Status: She is alert.    ED Results / Procedures / Treatments   Labs (all labs ordered are listed, but only abnormal results are  displayed) Labs Reviewed  CBC WITH DIFFERENTIAL/PLATELET - Abnormal; Notable for the following components:      Result Value   WBC 20.2 (*)    Hemoglobin 10.9 (*)    HCT 32.2 (*)    MCV 65.3 (*)    MCH 22.1 (*)    RDW 20.5 (*)    Neutro Abs 17.6 (*)    Monocytes Absolute 1.1 (*)    Abs Immature Granulocytes 0.38 (*)    All other components within normal limits  COMPREHENSIVE METABOLIC PANEL - Abnormal; Notable for the following components:   Potassium 2.9 (*)    Chloride 97 (*)    Glucose, Bld 115 (*)    Albumin 3.0 (*)    All other components within normal limits  URINALYSIS, ROUTINE W REFLEX MICROSCOPIC - Abnormal; Notable for the following components:   APPearance HAZY (*)    Hgb urine dipstick SMALL (*)    Protein, ur 30 (*)    Leukocytes,Ua MODERATE (*)    Bacteria, UA RARE (*)    All other components within normal limits  LACTIC ACID, PLASMA - Abnormal; Notable for the following components:   Lactic Acid, Venous 2.4 (*)    All other components within normal limits  RAPID URINE DRUG SCREEN, HOSP PERFORMED - Abnormal; Notable for the following components:   Cocaine POSITIVE (*)    All other components within normal limits  RESP PANEL BY RT-PCR (FLU A&B, COVID) ARPGX2  CULTURE, BLOOD (ROUTINE X 2)  CULTURE, BLOOD (ROUTINE X 2)  LIPASE, BLOOD  PREGNANCY, URINE  LACTIC ACID, PLASMA    EKG EKG Interpretation  Date/Time:  Thursday February 26 2021 04:15:47 EDT Ventricular Rate:  71 PR Interval:  109 QRS Duration: 90 QT Interval:  392 QTC Calculation: 426 R Axis:   81 Text Interpretation: Sinus rhythm Short PR interval Consider left ventricular hypertrophy Borderline T abnormalities, lateral leads No significant change since last tracing Confirmed by Wandra Arthurs ([id number]) on 02/26/2021 4:28:15 AM  Radiology DG Chest Port 1 View  Result Date: 02/26/2021 CLINICAL DATA:  45 year old female with history of fever and worsening upper abdominal pain. EXAM: PORTABLE CHEST 1  VIEW COMPARISON:  Chest x-ray 09/17/2020. FINDINGS: Skin fold artifacts are noted bilaterally. Lung volumes are normal. No consolidative airspace disease. No pleural effusions. No pneumothorax. No pulmonary nodule or mass noted. Pulmonary vasculature  and the cardiomediastinal silhouette are within normal limits. IMPRESSION: 1.  No radiographic evidence of acute cardiopulmonary disease. Electronically Signed   By: Vinnie Langton M.D.   On: 02/26/2021 06:13    Procedures Procedures   Medications Ordered in ED Medications  sodium chloride (PF) 0.9 % injection (has no administration in time range)  fentaNYL (SUBLIMAZE) injection 50 mcg (has no administration in time range)  potassium chloride 10 mEq in 100 mL IVPB (0 mEq Intravenous Stopped 02/26/21 0534)  sucralfate (CARAFATE) tablet 1 g (1 g Oral Given 02/26/21 0445)  famotidine (PEPCID) IVPB 20 mg premix (0 mg Intravenous Stopped 02/26/21 0449)  HYDROmorphone (DILAUDID) injection 1 mg (1 mg Intravenous Given 02/26/21 0413)  piperacillin-tazobactam (ZOSYN) IVPB 3.375 g (0 g Intravenous Stopped 02/26/21 0520)  sodium chloride 0.9 % bolus 1,000 mL (0 mLs Intravenous Stopped 02/26/21 0653)  fentaNYL (SUBLIMAZE) injection 25 mcg (25 mcg Intravenous Given 02/26/21 0446)  iohexol (OMNIPAQUE) 350 MG/ML injection 80 mL (80 mLs Intravenous Contrast Given 02/26/21 6384)    ED Course  I have reviewed the triage vital signs and the nursing notes.  Pertinent labs & imaging results that were available during my care of the patient were reviewed by me and considered in my medical decision making (see chart for details).    MDM Rules/Calculators/A&P                           45 year old female who presents to the ED today with complaint of worsening diffuse abdominal pain that began tonight.  She had recent admission for severe gastric ulcer and underwent an EGD; she unfortunately left prior to being discharged and was unable to be prescribed medications for  treatment.  She returns tonight for worsening pain.  On arrival to the ED patient's blood pressure significantly elevated at 158/116 however repeat 130/82.  Remainder of vitals are unremarkable.  She had screening labs done while in the waiting room including a CBC with a leukocytosis of 20,200 with left shift.  She does mention that she had a fever over the weekend with a T-max of 1-2.0.  CMP with a potassium of 2.9.  Chloride decreased at 97.  No other electrolyte abnormalities.  Exam she has diffuse abdominal tenderness palpation with voluntary guarding.  There is concern for abdominal infection at this time.  We will plan for blood cultures, lactic acid, CT scan.  Placed on Zosyn for intra-abdominal infection.  We will plan to treat pain with Carafate, Pepcid, Dilaudid as this appears to be what she was getting while in the hospital.  Also provide supplemental potassium for her potassium of 2.9.  Lactic acid elevated 2.4; receiving fluids at this time CXR negative. CT Chest added at this time.  U/A with moderate leuks and 21-50 WBCs with rare bacteria COVID and flu negative  Pending CT Chest and A/P at this time. At shift change case signed out to oncoming team member Suella Broad, PA-C who will admit patient pending CT scans.   Final Clinical Impression(s) / ED Diagnoses Final diagnoses:  Sepsis without acute organ dysfunction, due to unspecified organism [facility])  Generalized abdominal pain    Rx / DC Orders ED Discharge Orders     None        Eustaquio Maize, PA-C 02/26/21 6659    Drenda Freeze, MD 03/01/21 1731

## 2021-02-26 NOTE — Progress Notes (Signed)
Pt's BP is 200s/110s. NP on call notified and manual BP confirmed BP is 210/102. PO catapres ordered however when RN attempted to administer medication pt refused stating "I am not swallowing another pill. My back and sides and throat hurt". Attempted to provide patient education about the medication ordered and the dangers of having a BP elevated as hers is currently. Pt states "I know that it could be back for my health, but I am hurting". NP notified and will continue to await further instructions.

## 2021-02-27 DIAGNOSIS — R5381 Other malaise: Secondary | ICD-10-CM | POA: Diagnosis present

## 2021-02-27 DIAGNOSIS — E441 Mild protein-calorie malnutrition: Secondary | ICD-10-CM

## 2021-02-27 DIAGNOSIS — Z20822 Contact with and (suspected) exposure to covid-19: Secondary | ICD-10-CM | POA: Diagnosis present

## 2021-02-27 DIAGNOSIS — Z981 Arthrodesis status: Secondary | ICD-10-CM | POA: Diagnosis not present

## 2021-02-27 DIAGNOSIS — F17213 Nicotine dependence, cigarettes, with withdrawal: Secondary | ICD-10-CM | POA: Diagnosis present

## 2021-02-27 DIAGNOSIS — Z833 Family history of diabetes mellitus: Secondary | ICD-10-CM | POA: Diagnosis not present

## 2021-02-27 DIAGNOSIS — Z720 Tobacco use: Secondary | ICD-10-CM

## 2021-02-27 DIAGNOSIS — F1410 Cocaine abuse, uncomplicated: Secondary | ICD-10-CM

## 2021-02-27 DIAGNOSIS — E876 Hypokalemia: Secondary | ICD-10-CM | POA: Diagnosis present

## 2021-02-27 DIAGNOSIS — D509 Iron deficiency anemia, unspecified: Secondary | ICD-10-CM | POA: Diagnosis present

## 2021-02-27 DIAGNOSIS — Z8249 Family history of ischemic heart disease and other diseases of the circulatory system: Secondary | ICD-10-CM | POA: Diagnosis not present

## 2021-02-27 DIAGNOSIS — Z6820 Body mass index (BMI) 20.0-20.9, adult: Secondary | ICD-10-CM | POA: Diagnosis not present

## 2021-02-27 DIAGNOSIS — Z5329 Procedure and treatment not carried out because of patient's decision for other reasons: Secondary | ICD-10-CM | POA: Diagnosis present

## 2021-02-27 DIAGNOSIS — F419 Anxiety disorder, unspecified: Secondary | ICD-10-CM | POA: Diagnosis present

## 2021-02-27 DIAGNOSIS — Z888 Allergy status to other drugs, medicaments and biological substances status: Secondary | ICD-10-CM | POA: Diagnosis not present

## 2021-02-27 DIAGNOSIS — Z5900 Homelessness unspecified: Secondary | ICD-10-CM | POA: Diagnosis not present

## 2021-02-27 DIAGNOSIS — G894 Chronic pain syndrome: Secondary | ICD-10-CM | POA: Diagnosis present

## 2021-02-27 DIAGNOSIS — N10 Acute tubulo-interstitial nephritis: Secondary | ICD-10-CM | POA: Diagnosis present

## 2021-02-27 DIAGNOSIS — I10 Essential (primary) hypertension: Secondary | ICD-10-CM | POA: Diagnosis present

## 2021-02-27 LAB — CBC WITH DIFFERENTIAL/PLATELET
Abs Immature Granulocytes: 0.06 10*3/uL (ref 0.00–0.07)
Basophils Absolute: 0 10*3/uL (ref 0.0–0.1)
Basophils Relative: 0 %
Eosinophils Absolute: 0 10*3/uL (ref 0.0–0.5)
Eosinophils Relative: 0 %
HCT: 29.1 % — ABNORMAL LOW (ref 36.0–46.0)
Hemoglobin: 9.7 g/dL — ABNORMAL LOW (ref 12.0–15.0)
Immature Granulocytes: 0 %
Lymphocytes Relative: 9 %
Lymphs Abs: 1.2 10*3/uL (ref 0.7–4.0)
MCH: 21.7 pg — ABNORMAL LOW (ref 26.0–34.0)
MCHC: 33.3 g/dL (ref 30.0–36.0)
MCV: 65 fL — ABNORMAL LOW (ref 80.0–100.0)
Monocytes Absolute: 0.7 10*3/uL (ref 0.1–1.0)
Monocytes Relative: 5 %
Neutro Abs: 12 10*3/uL — ABNORMAL HIGH (ref 1.7–7.7)
Neutrophils Relative %: 86 %
Platelets: 338 10*3/uL (ref 150–400)
RBC: 4.48 MIL/uL (ref 3.87–5.11)
RDW: 20.6 % — ABNORMAL HIGH (ref 11.5–15.5)
WBC: 14.1 10*3/uL — ABNORMAL HIGH (ref 4.0–10.5)
nRBC: 0 % (ref 0.0–0.2)

## 2021-02-27 LAB — BASIC METABOLIC PANEL
Anion gap: 9 (ref 5–15)
BUN: 9 mg/dL (ref 6–20)
CO2: 24 mmol/L (ref 22–32)
Calcium: 8.3 mg/dL — ABNORMAL LOW (ref 8.9–10.3)
Chloride: 103 mmol/L (ref 98–111)
Creatinine, Ser: 0.61 mg/dL (ref 0.44–1.00)
GFR, Estimated: >60 mL/min (ref >60)
Glucose, Bld: 86 mg/dL (ref 70–99)
Potassium: 3.6 mmol/L (ref 3.5–5.1)
Sodium: 136 mmol/L (ref 135–145)

## 2021-02-27 LAB — MAGNESIUM: Magnesium: 1.7 mg/dL (ref 1.7–2.4)

## 2021-02-27 MED ORDER — ACETAMINOPHEN 650 MG RE SUPP: 650.0000 mg | Freq: Four times a day (QID) | RECTAL | Status: DC | PRN

## 2021-02-27 MED ORDER — LIDOCAINE 5 % EX PTCH
1.0000 | MEDICATED_PATCH | CUTANEOUS | Status: DC
  Administered 2021-02-27 – 2021-03-01 (×3): 1 via TRANSDERMAL
  Filled 2021-02-27 (×4): qty 1

## 2021-02-27 MED ORDER — NITROGLYCERIN 2 % TD OINT
0.5000 [in_us] | TOPICAL_OINTMENT | Freq: Four times a day (QID) | TRANSDERMAL | Status: DC
  Administered 2021-02-27 – 2021-02-28 (×4): 0.5 [in_us] via TOPICAL
  Filled 2021-02-27: qty 30

## 2021-02-27 MED ORDER — PANTOPRAZOLE SODIUM 40 MG PO TBEC
40.0000 mg | DELAYED_RELEASE_TABLET | Freq: Two times a day (BID) | ORAL | Status: DC
  Administered 2021-02-27 – 2021-03-01 (×6): 40 mg via ORAL
  Filled 2021-02-27 (×6): qty 1

## 2021-02-27 MED ORDER — SUCRALFATE 1 G PO TABS
1.0000 g | ORAL_TABLET | Freq: Three times a day (TID) | ORAL | Status: DC
  Administered 2021-02-27 – 2021-03-02 (×11): 1 g via ORAL
  Filled 2021-02-27 (×11): qty 1

## 2021-02-27 MED ORDER — POTASSIUM CHLORIDE CRYS ER 20 MEQ PO TBCR
40.0000 meq | EXTENDED_RELEASE_TABLET | Freq: Two times a day (BID) | ORAL | Status: DC
  Filled 2021-02-27: qty 2

## 2021-02-27 MED ORDER — AMLODIPINE BESYLATE 10 MG PO TABS
10.0000 mg | ORAL_TABLET | Freq: Every day | ORAL | Status: DC
  Administered 2021-02-27 – 2021-03-01 (×3): 10 mg via ORAL
  Filled 2021-02-27 (×3): qty 1

## 2021-02-27 MED ORDER — ACETAMINOPHEN 325 MG PO TABS
650.0000 mg | ORAL_TABLET | Freq: Four times a day (QID) | ORAL | Status: DC | PRN
  Administered 2021-02-27 – 2021-03-02 (×5): 650 mg via ORAL
  Filled 2021-02-27 (×5): qty 2

## 2021-02-27 MED ORDER — LOSARTAN POTASSIUM 50 MG PO TABS
50.0000 mg | ORAL_TABLET | Freq: Every day | ORAL | Status: DC
  Administered 2021-02-27 – 2021-03-01 (×3): 50 mg via ORAL
  Filled 2021-02-27 (×3): qty 1

## 2021-02-27 MED ORDER — OXYCODONE HCL 5 MG PO TABS
5.0000 mg | ORAL_TABLET | ORAL | Status: DC | PRN
  Administered 2021-02-27 – 2021-03-02 (×13): 5 mg via ORAL
  Filled 2021-02-27 (×13): qty 1

## 2021-02-27 MED ORDER — CYCLOBENZAPRINE HCL 5 MG PO TABS
5.0000 mg | ORAL_TABLET | Freq: Three times a day (TID) | ORAL | Status: DC | PRN
  Administered 2021-02-28 – 2021-03-01 (×2): 5 mg via ORAL
  Filled 2021-02-27 (×2): qty 1

## 2021-02-27 MED ORDER — HYDRALAZINE HCL 25 MG PO TABS
25.0000 mg | ORAL_TABLET | Freq: Three times a day (TID) | ORAL | Status: DC
  Administered 2021-02-27 – 2021-03-02 (×10): 25 mg via ORAL
  Filled 2021-02-27 (×10): qty 1

## 2021-02-27 MED ORDER — POTASSIUM CHLORIDE 10 MEQ/100ML IV SOLN
10.0000 meq | INTRAVENOUS | Status: AC
Start: 2021-02-27 — End: 2021-02-27
  Administered 2021-02-27 (×4): 10 meq via INTRAVENOUS
  Filled 2021-02-27 (×4): qty 100

## 2021-02-27 MED ORDER — LORAZEPAM 1 MG PO TABS
1.0000 mg | ORAL_TABLET | Freq: Four times a day (QID) | ORAL | Status: DC | PRN
  Administered 2021-02-27 – 2021-03-01 (×5): 1 mg via ORAL
  Filled 2021-02-27 (×5): qty 1

## 2021-02-27 NOTE — Plan of Care (Signed)
  Problem: Education: Goal: Knowledge of General Education information will improve Description: Including pain rating scale, medication(s)/side effects and non-pharmacologic comfort measures Outcome: Progressing   Problem: Clinical Measurements: Goal: Will remain free from infection Outcome: Progressing Goal: Diagnostic test results will improve Outcome: Progressing   Problem: Coping: Goal: Level of anxiety will decrease Outcome: Progressing   Problem: Pain Managment: Goal: General experience of comfort will improve Outcome: Progressing

## 2021-02-27 NOTE — Progress Notes (Signed)
PROGRESS NOTE  Terri [name] MGN:[id number] DOB: 1975/11/15 DOA: 02/25/2021 PCP: Patient, No Pcp Per (Inactive)   LOS: 0 days   Brief narrative:  Terri [name] is a 45 y.o. 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 coli bacteremia presented to hospital with worsening left flank pain fever fatigue malaise.  Patient does have chronic abdominal pain which was exacerbated by use of cocaine 3 to 4 days back.  In the ED, patient was noted to be hypertensive.  Patient received normal saline bolus IV Zosyn, Dilaudid fentanyl and was admitted to hospital.  Urinalysis showed leukocyte esterase 21-50 white cells.  Urine drug screen was positive for cocaine.  Lactate was elevated at 2.4.  CBC showed elevated white count at 20.2 with 88% neutrophils.  X-ray was negative.  CT scan of the abdomen and pelvis showed acute left pyelonephritis.  Patient was then admitted hospital for further evaluation and treatment.  Assessment/Plan:  Principal Problem:   Acute pyelonephritis Active Problems:   Hypokalemia   Cocaine abuse (HCC)   Tobacco abuse   Microcytic anemia   Mild protein malnutrition (HCC)  Acute pyelonephritis Continue IV Rocephin, follow blood cultures urine cultures.  Continue IV fluids.  Requiring IV Dilaudid for severe pain including oxycodone..  Add muscle relaxant and Lidoderm patch on the back.  Does have history of chronic back pain as well.   Hypokalemia Improved.  Potassium 3.6.  Received IV KCl.  On normal saline with KCl.  Accelerated hypertension.  Likely secondary to cocaine abuse and anxiety.  We will continue Nitropaste, Ativan, add hydralazine, will continue to monitor closely.     Cocaine abuse  Patient was  counseled against smoking.  We will consult transition of care team on substance abuse resources.  We will add Nitropaste and Ativan due to anxiety increased pain and hypertension     Microcytic anemia Latest hemoglobin of 9.7.   Mild protein malnutrition  Continue nutritional supplements   DVT prophylaxis: SCDs Start: 02/26/21 1003  Code Status: Full code  Family Communication: None  Status is: Inpatient  Remains inpatient appropriate because:IV treatments appropriate due to intensity of illness or inability to take PO and Inpatient level of care appropriate due to severity of illness  Dispo: The patient is from: Home              Anticipated d/c is to: Home              Patient currently is not medically stable to d/c.   Difficult to place patient No  Consultants: None  Procedures: None  Anti-infectives:  Rocephin IV  Anti-infectives (From admission, onward)    Start     Dose/Rate Route Frequency Ordered Stop   02/26/21 1000  cefTRIAXone (ROCEPHIN) 2 g in sodium chloride 0.9 % 100 mL IVPB        2 g 200 mL/hr over 30 Minutes Intravenous Daily 02/26/21 0733 03/05/21 0959   02/26/21 0345  piperacillin-tazobactam (ZOSYN) IVPB 3.375 g        3.375 g 100 mL/hr over 30 Minutes Intravenous  Once 02/26/21 0338 02/26/21 0520      Subjective: Today, patient was seen and examined at bedside.  Patient still complains of severe pain in the back and chest asking for IV pain medication.  Objective:  Vitals:   02/27/21 0527 02/27/21 0804  BP: (!) 211/113 (!) 203/112  Pulse: 63 67  Resp: 19 (!) 22  Temp: 98 F (36.7 C) (!) 97 F (36.1 C)  SpO2: 99% 97%    Intake/Output Summary (Last 24 hours) at 02/27/2021 1130 Last data filed at 02/27/2021 1045 Gross per 24 hour  Intake 2486.2 ml  Output 600 ml  Net 1886.2 ml   Filed Weights   02/26/21 1708  Weight: 59.9 kg   Body mass index is 19.5 kg/m.   Physical Exam:  GENERAL: Patient is alert awake and oriented.   Appears to be in mild distress, anxious, thinly built. HENT: No scleral pallor or icterus. Pupils equally reactive to light. Oral mucosa is moist NECK: is supple, no gross swelling noted. CHEST: Clear to auscultation. No crackles or wheezes.  Diminished breath sounds bilaterally.  Nonspecific tenderness over the chest wall,spinal and lower back area, left costovertebral angle tenderness. CVS: S1 and S2 heard, no murmur. Regular rate and rhythm.  ABDOMEN: Soft, non-tender, bowel sounds are present. EXTREMITIES: No edema. CNS: Cranial nerves are intact. No focal motor deficits.  Thinly built, anxious SKIN: warm and dry without rashes.  Data Review: I have personally reviewed the following laboratory data and studies,  CBC: Recent Labs  Lab 02/25/21 2349 02/27/21 0355  WBC 20.2* 14.1*  NEUTROABS 17.6* 12.0*  HGB 10.9* 9.7*  HCT 32.2* 29.1*  MCV 65.3* 65.0*  PLT 387 465   Basic Metabolic Panel: Recent Labs  Lab 02/25/21 2349 02/26/21 0347 02/27/21 0355  NA 136 132* 136  K 2.9* 2.7* 3.6  CL 97* 93* 103  CO2 29 31 24   GLUCOSE 115* 79 86  BUN 11 12 9   CREATININE 0.80 0.83 0.61  CALCIUM 9.0 9.1 8.3*  MG  --  1.7 1.7  PHOS  --  3.9  --    Liver Function Tests: Recent Labs  Lab 02/25/21 2349  AST 17  ALT 14  ALKPHOS 108  BILITOT 1.0  PROT 6.7  ALBUMIN 3.0*   Recent Labs  Lab 02/26/21 0347  LIPASE 27   No results for input(s): AMMONIA in the last 168 hours. Cardiac Enzymes: No results for input(s): CKTOTAL, CKMB, CKMBINDEX, TROPONINI in the last 168 hours. BNP (last 3 results) No results for input(s): BNP in the last 8760 hours.  ProBNP (last 3 results) No results for input(s): PROBNP in the last 8760 hours.  CBG: No results for input(s): GLUCAP in the last 168 hours. Recent Results (from the past 240 hour(s))  Culture, blood (routine x 2)     Status: None (Preliminary result)   Collection Time: 02/26/21  4:18 AM   Specimen: BLOOD  Result Value Ref Range  Status   Specimen Description   Final    BLOOD RIGHT ANTECUBITAL Performed at [facility] [address] [place], [place] [address]    Special Requests   Final    BOTTLES DRAWN AEROBIC AND ANAEROBIC Blood Culture results may not be optimal due to an inadequate volume of blood received in culture bottles Performed at [facility] [address], [place], [place] [address]    Culture   Final    NO GROWTH < 12 HOURS Performed at [facility] [address] [place],  [address]    Report Status PENDING  Incomplete  Culture, blood (routine x 2)     Status: None (Preliminary result)   Collection Time: 02/26/21  4:18  AM   Specimen: BLOOD  Result Value Ref Range Status   Specimen Description   Final    BLOOD RIGHT ARM Performed at [facility] [address], [place], [place] [address]    Special Requests   Final    BOTTLES DRAWN AEROBIC AND ANAEROBIC Blood Culture results may not be optimal due to an inadequate volume of blood received in culture bottles Performed at [facility] [address] [place], [place] [address]    Culture   Final    NO GROWTH < 12 HOURS Performed at [facility] [address], [place], [place] [address]    Report Status PENDING  Incomplete  Resp Panel by RT-PCR (Flu A&B, Covid)     Status: None   Collection Time: 02/26/21  4:18 AM   Specimen: Nasopharyngeal(NP) swabs in vial transport medium  Result Value Ref Range Status   SARS Coronavirus 2 by RT PCR NEGATIVE NEGATIVE Final    Comment: (NOTE) SARS-CoV-2 target nucleic acids are NOT DETECTED.  The SARS-CoV-2 RNA is generally detectable in upper respiratory specimens during the acute phase of infection. The lowest concentration of SARS-CoV-2 viral copies this assay can detect is 138 copies/mL. A negative result does not preclude SARS-Cov-2 infection and should not be used as the sole basis for  treatment or other patient management decisions. A negative result may occur with  improper specimen collection/handling, submission of specimen other than nasopharyngeal swab, presence of viral mutation(s) within the areas targeted by this assay, and inadequate number of viral copies(<138 copies/mL). A negative result must be combined with clinical observations, patient history, and epidemiological information. The expected result is Negative.  Fact Sheet for Patients:  EntrepreneurPulse.com.au  Fact Sheet for Healthcare Providers:  IncredibleEmployment.be  This test is no t yet approved or cleared by the [place] FDA and  has been authorized for detection and/or diagnosis of SARS-CoV-2 by FDA under an Emergency Use Authorization (EUA). This EUA will remain  in effect (meaning this test can be used) for the duration of the COVID-19 declaration under Section 564(b)(1) of the Act, 21 U.S.C.section 360bbb-3(b)(1), unless the authorization is terminated  or revoked sooner.       Influenza A by PCR NEGATIVE NEGATIVE Final   Influenza B by PCR NEGATIVE NEGATIVE Final    Comment: (NOTE) The Xpert Xpress SARS-CoV-2/FLU/RSV plus assay is intended as an aid in the diagnosis of influenza from Nasopharyngeal swab specimens and should not be used as a sole basis for treatment. Nasal washings and aspirates are unacceptable for Xpert Xpress SARS-CoV-2/FLU/RSV testing.  Fact Sheet for Patients: EntrepreneurPulse.com.au  Fact Sheet for Healthcare Providers: IncredibleEmployment.be  This test is not yet approved or cleared by the [place] FDA and has been authorized for detection and/or diagnosis of SARS-CoV-2 by FDA under an Emergency Use Authorization (EUA). This EUA will remain in effect (meaning this test can be used) for the duration of the COVID-19 declaration under Section 564(b)(1) of the Act, 21  U.S.C. section 360bbb-3(b)(1), unless the authorization is terminated or revoked.  Performed at [facility], [place] [address] [place], [place] [address]      Studies: CT Chest W Contrast  Result Date: 02/26/2021 CLINICAL DATA:  45 year old female with increasing upper abdominal pain. EXAM: CT CHEST, ABDOMEN, AND PELVIS WITH CONTRAST TECHNIQUE: Multidetector CT imaging of the chest, abdomen and pelvis was performed following the standard protocol during bolus administration of intravenous contrast. CONTRAST:  55mL OMNIPAQUE  IOHEXOL 350 MG/ML SOLN COMPARISON:  CT Abdomen and Pelvis 01/17/2021 and earlier. Chest radiograph 0556 hours today. Neck CT 09/25/2017. FINDINGS: CT CHEST FINDINGS Cardiovascular: Calcified aortic atherosclerosis. No cardiomegaly or pericardial effusion. Calcified coronary artery atherosclerosis is widespread (series 2, image 30). The central pulmonary arteries are enhanced and appear to be patent. Mediastinum/Nodes: Negative. No mediastinal mass or lymphadenopathy. Lungs/Pleura: Major airways are patent with mild bilateral bronchial wall thickening. Occasional subpleural lung scarring (anterior right upper lobe series 5, image 42). Patchy dependent opacity in the costophrenic angles more resembles atelectasis than infection. There is questionable early centrilobular emphysema in the upper lobes. No pleural effusion or other abnormal pulmonary opacity. Musculoskeletal: No acute or suspicious osseous lesion in the chest. CT ABDOMEN PELVIS FINDINGS Hepatobiliary: Chronically absent gallbladder. Stable liver with mild postoperative bile duct enlargement. Pancreas: Stable pancreatic atrophy. Spleen: Stable mild splenomegaly. Adrenals/Urinary Tract: New heterogeneous enhancement of the left kidney (series 12, image 64) typical for acute pyelonephritis. No left hydronephrosis. Right renal enhancement appears stable and within normal limits. Adrenal glands remain normal.  On the delayed images there is fairly symmetric contrast excretion from both kidneys. No discrete pararenal fluid collection. There is mild urothelial thickening at the left renal pelvis. Unremarkable bladder. Stomach/Bowel: Generalized mesenteric edema throughout the abdomen and pelvis. No dilated loops. 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 No discrete bowel inflammation or free air identified. Evidence of a normal gas containing appendix on coronal image 86. Vascular/Lymphatic: Advanced Aortoiliac calcified atherosclerosis. Major arterial structures remain patent. Portal venous system is inadequately opacified but the main portal vein does appear to be patent. No lymphadenopathy identified. Reproductive: Negative.  Numerous pelvic phleboliths. Other: New pelvic free fluid, moderate volume with simple fluid density (series 2, image 114). Generalized body wall edema has not significantly changed since August. Musculoskeletal: Age advanced lumbar spine degeneration with previous posterior lumbar decompression. Advanced chronic disc and endplate degeneration from L3 to the sacrum. No acute osseous abnormality identified. IMPRESSION: 1. Acute Left Pyelonephritis. No associated renal abscess or other complicating features. 2. New moderate volume pelvic free fluid since August. This is nonspecific, but perhaps related to anasarca given the appearance of continued body wall edema and new generalized mesenteric congestion since August. 3. No discrete bowel inflammation is evident. 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 4. No acute or inflammatory process identified in the chest. No pleural effusion. 5. Advanced calcified coronary artery and Aortic Atherosclerosis (ICD10-I70.0). Electronically Signed   By: Genevie Ann M.D.   On: 02/26/2021 07:08   CT Abdomen Pelvis W Contrast  Result Date: 02/26/2021 CLINICAL DATA:  45 year old female with  increasing upper abdominal pain. EXAM: CT CHEST, ABDOMEN, AND PELVIS WITH CONTRAST TECHNIQUE: Multidetector CT imaging of the chest, abdomen and pelvis was performed following the standard protocol during bolus administration of intravenous contrast. CONTRAST:  20mL OMNIPAQUE IOHEXOL 350 MG/ML SOLN COMPARISON:  CT Abdomen and Pelvis 01/17/2021 and earlier. Chest radiograph 0556 hours today. Neck CT 09/25/2017. FINDINGS: CT CHEST FINDINGS Cardiovascular: Calcified aortic atherosclerosis. No cardiomegaly or pericardial effusion. Calcified coronary artery atherosclerosis is widespread (series 2, image 30). The central pulmonary arteries are enhanced and appear to be patent. Mediastinum/Nodes: Negative. No mediastinal mass or lymphadenopathy. Lungs/Pleura: Major airways are patent with mild bilateral bronchial wall thickening. Occasional subpleural lung scarring (anterior right upper lobe series 5, image 42). Patchy dependent opacity in the costophrenic angles more resembles atelectasis than infection. There is questionable early centrilobular emphysema  in the upper lobes. No pleural effusion or other abnormal pulmonary opacity. Musculoskeletal: No acute or suspicious osseous lesion in the chest. CT ABDOMEN PELVIS FINDINGS Hepatobiliary: Chronically absent gallbladder. Stable liver with mild postoperative bile duct enlargement. Pancreas: Stable pancreatic atrophy. Spleen: Stable mild splenomegaly. Adrenals/Urinary Tract: New heterogeneous enhancement of the left kidney (series 12, image 64) typical for acute pyelonephritis. No left hydronephrosis. Right renal enhancement appears stable and within normal limits. Adrenal glands remain normal. On the delayed images there is fairly symmetric contrast excretion from both kidneys. No discrete pararenal fluid collection. There is mild urothelial thickening at the left renal pelvis. Unremarkable bladder. Stomach/Bowel: Generalized mesenteric edema throughout the abdomen and  pelvis. No dilated loops. 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 No discrete bowel inflammation or free air identified. Evidence of a normal gas containing appendix on coronal image 86. Vascular/Lymphatic: Advanced Aortoiliac calcified atherosclerosis. Major arterial structures remain patent. Portal venous system is inadequately opacified but the main portal vein does appear to be patent. No lymphadenopathy identified. Reproductive: Negative.  Numerous pelvic phleboliths. Other: New pelvic free fluid, moderate volume with simple fluid density (series 2, image 114). Generalized body wall edema has not significantly changed since August. Musculoskeletal: Age advanced lumbar spine degeneration with previous posterior lumbar decompression. Advanced chronic disc and endplate degeneration from L3 to the sacrum. No acute osseous abnormality identified. IMPRESSION: 1. Acute Left Pyelonephritis. No associated renal abscess or other complicating features. 2. New moderate volume pelvic free fluid since August. This is nonspecific, but perhaps related to anasarca given the appearance of continued body wall edema and new generalized mesenteric congestion since August. 3. No discrete bowel inflammation is evident. 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 4. No acute or inflammatory process identified in the chest. No pleural effusion. 5. Advanced calcified coronary artery and Aortic Atherosclerosis (ICD10-I70.0). Electronically Signed   By: Genevie Ann M.D.   On: 02/26/2021 07:08   DG Chest Port 1 View  Result Date: 02/26/2021 CLINICAL DATA:  45 year old female with history of fever and worsening upper abdominal pain. EXAM: PORTABLE CHEST 1 VIEW COMPARISON:  Chest x-ray 09/17/2020. FINDINGS: Skin fold artifacts are noted bilaterally. Lung volumes are normal. No consolidative airspace disease. No pleural effusions. No pneumothorax. No pulmonary nodule  or mass noted. Pulmonary vasculature and the cardiomediastinal silhouette are within normal limits. IMPRESSION: 1.  No radiographic evidence of acute cardiopulmonary disease. Electronically Signed   By: Vinnie Langton M.D.   On: 02/26/2021 06:13      Flora Lipps, MD  [facility] 02/27/2021  If 7PM-7AM, please contact night-coverage

## 2021-02-28 DIAGNOSIS — D509 Iron deficiency anemia, unspecified: Secondary | ICD-10-CM | POA: Diagnosis not present

## 2021-02-28 DIAGNOSIS — F1410 Cocaine abuse, uncomplicated: Secondary | ICD-10-CM | POA: Diagnosis not present

## 2021-02-28 DIAGNOSIS — E876 Hypokalemia: Secondary | ICD-10-CM | POA: Diagnosis not present

## 2021-02-28 DIAGNOSIS — N10 Acute tubulo-interstitial nephritis: Secondary | ICD-10-CM | POA: Diagnosis not present

## 2021-02-28 LAB — CBC
HCT: 26 % — ABNORMAL LOW (ref 36.0–46.0)
Hemoglobin: 8.6 g/dL — ABNORMAL LOW (ref 12.0–15.0)
MCH: 21.5 pg — ABNORMAL LOW (ref 26.0–34.0)
MCHC: 33.1 g/dL (ref 30.0–36.0)
MCV: 65 fL — ABNORMAL LOW (ref 80.0–100.0)
Platelets: 344 10*3/uL (ref 150–400)
RBC: 4 MIL/uL (ref 3.87–5.11)
RDW: 20.9 % — ABNORMAL HIGH (ref 11.5–15.5)
WBC: 12.3 10*3/uL — ABNORMAL HIGH (ref 4.0–10.5)
nRBC: 0 % (ref 0.0–0.2)

## 2021-02-28 LAB — URINE CULTURE: Culture: NO GROWTH

## 2021-02-28 LAB — BASIC METABOLIC PANEL
Anion gap: 10 (ref 5–15)
BUN: 9 mg/dL (ref 6–20)
CO2: 25 mmol/L (ref 22–32)
Calcium: 8.3 mg/dL — ABNORMAL LOW (ref 8.9–10.3)
Chloride: 96 mmol/L — ABNORMAL LOW (ref 98–111)
Creatinine, Ser: 0.77 mg/dL (ref 0.44–1.00)
GFR, Estimated: >60 mL/min (ref >60)
Glucose, Bld: 70 mg/dL (ref 70–99)
Potassium: 3.7 mmol/L (ref 3.5–5.1)
Sodium: 131 mmol/L — ABNORMAL LOW (ref 135–145)

## 2021-02-28 LAB — MAGNESIUM: Magnesium: 1.7 mg/dL (ref 1.7–2.4)

## 2021-02-28 NOTE — Progress Notes (Signed)
Notified on call provider about patient's BP being elevated, even though patient has had elevated BP. Passed along to day shift nurse.

## 2021-02-28 NOTE — Plan of Care (Signed)
  Problem: Education: Goal: Knowledge of General Education information will improve Description: Including pain rating scale, medication(s)/side effects and non-pharmacologic comfort measures Outcome: Progressing   Problem: Clinical Measurements: Goal: Will remain free from infection Outcome: Progressing Goal: Diagnostic test results will improve Outcome: Progressing   

## 2021-02-28 NOTE — Progress Notes (Signed)
PROGRESS NOTE  Terri [name] MVE:[id number] DOB: 1975-11-07 DOA: 02/25/2021 PCP: Patient, No Pcp Per (Inactive)   LOS: 1 day   Brief narrative:  Terri [name] is a 45 y.o. 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 coli bacteremia presented to hospital with worsening left flank pain fever fatigue malaise.  Patient does have chronic abdominal pain which was exacerbated by use of cocaine 3 to 4 days back.  In the ED, patient was noted to be hypertensive.  Patient received normal saline bolus IV Zosyn, Dilaudid fentanyl and was admitted to hospital.  Urinalysis showed leukocyte esterase 21-50 white cells.  Urine drug screen was positive for cocaine.  Lactate was elevated at 2.4.  CBC showed elevated white count at 20.2 with 88% neutrophils.  X-ray was negative.  CT scan of the abdomen and pelvis showed acute left pyelonephritis.  Patient was then admitted hospital for further evaluation and treatment.  Assessment/Plan:  Principal Problem:   Acute pyelonephritis Active Problems:   Hypokalemia   Cocaine abuse (HCC)   Tobacco abuse   Microcytic anemia   Mild protein malnutrition (HCC)  Acute pyelonephritis Continue IV Rocephin,.  Urine culture negative so far.  Blood culture, Negative in 1 day. Requiring IV Dilaudid for severe pain including oxycodone.  Continue muscle relaxant and Lidoderm patch on the back.  Does have history of chronic back pain as well.  We will decrease Dilaudid due to somnolence today.  T-max of 98.5 F.  Leukocytosis at 12.3 at this time  Somnolent this morning.  We will decrease Dilaudid dose.   Hypokalemia Improved after replacement.  Latest potassium 3.7.  Accelerated hypertension.  Likely secondary  to cocaine abuse and anxiety.  On Nitropaste, Ativan,  hydralazine.  Overall improved at this time.     Cocaine abuse  Patient was counseled against smoking.  Transition of care consulted.  On Nitropaste and Ativan      Microcytic anemia Latest hemoglobin of 8.6.   Mild protein calorie malnutrition  Continue nutritional supplements   DVT prophylaxis: SCDs Start: 02/26/21 1003  Code Status: Full code  Family Communication:  Patient's boyfriend at bedside  Status is: Inpatient  Remains inpatient appropriate because:IV treatments appropriate due to intensity of illness or inability to take PO and Inpatient level of care appropriate due to severity of illness  Dispo: The patient is from: Home              Anticipated d/c is to: Home              Patient currently is not medically stable to d/c.   Difficult to place patient No  Consultants: None  Procedures: None  Anti-infectives:  Rocephin IV  Anti-infectives (From admission, onward)    Start     Dose/Rate Route Frequency Ordered Stop   02/26/21 1000  cefTRIAXone (ROCEPHIN) 2 g in sodium chloride 0.9 % 100 mL IVPB        2 g 200 mL/hr over 30 Minutes Intravenous Daily 02/26/21 0733 03/05/21 0959   02/26/21 0345  piperacillin-tazobactam (ZOSYN) IVPB 3.375 g        3.375 g 100 mL/hr over 30 Minutes Intravenous  Once 02/26/21 0338 02/26/21 0520      Subjective: Today, patient was seen and examined at bedside.  Appears to  be somnolent this morning and states that she received IV narcotics.  Objective: Vitals:   02/28/21 1420 02/28/21 1420  BP: (!) 145/85 (!) 143/90  Pulse: 99 99  Resp: 16 16  Temp: 97.7 F (36.5 C) 97.7 F (36.5 C)  SpO2: 98% 97%    Intake/Output Summary (Last 24 hours) at 02/28/2021 1445 Last data filed at 02/28/2021 1300 Gross per 24 hour  Intake 1340 ml  Output 650 ml  Net 690 ml    Filed Weights   02/26/21 1708  Weight: 59.9 kg   Body mass index is 19.5 kg/m.   Physical  Exam: GENERAL: Patient is somnolent, anxious, thinly built HENT: No scleral pallor or icterus. Pupils equally reactive to light. Oral mucosa is moist NECK: is supple, no gross swelling noted. CHEST: Clear to auscultation. No crackles or wheezes.  Diminished breath sounds bilaterally.  Nonspecific tenderness over the chest wall spinal and lower back area.  Left costovertebral angle tenderness.   CVS: S1 and S2 heard, no murmur. Regular rate and rhythm.  ABDOMEN: Soft, non-tender, bowel sounds are present. EXTREMITIES: No edema. CNS: Cranial nerves are intact. No focal motor deficits.  Thinly built, anxious SKIN: warm and dry without rashes.  Data Review: I have personally reviewed the following laboratory data and studies,  CBC: Recent Labs  Lab 02/25/21 2349 02/27/21 0355 02/28/21 0350  WBC 20.2* 14.1* 12.3*  NEUTROABS 17.6* 12.0*  --   HGB 10.9* 9.7* 8.6*  HCT 32.2* 29.1* 26.0*  MCV 65.3* 65.0* 65.0*  PLT 387 338 702    Basic Metabolic Panel: Recent Labs  Lab 02/25/21 2349 02/26/21 0347 02/27/21 0355 02/28/21 0350  NA 136 132* 136 131*  K 2.9* 2.7* 3.6 3.7  CL 97* 93* 103 96*  CO2 29 31 24 25   GLUCOSE 115* 79 86 70  BUN 11 12 9 9   CREATININE 0.80 0.83 0.61 0.77  CALCIUM 9.0 9.1 8.3* 8.3*  MG  --  1.7 1.7 1.7  PHOS  --  3.9  --   --     Liver Function Tests: Recent Labs  Lab 02/25/21 2349  AST 17  ALT 14  ALKPHOS 108  BILITOT 1.0  PROT 6.7  ALBUMIN 3.0*    Recent Labs  Lab 02/26/21 0347  LIPASE 27    No results for input(s): AMMONIA in the last 168 hours. Cardiac Enzymes: No results for input(s): CKTOTAL, CKMB, CKMBINDEX, TROPONINI in the last 168 hours. BNP (last 3 results) No results for input(s): BNP in the last 8760 hours.  ProBNP (last 3 results) No results for input(s): PROBNP in the last 8760 hours.  CBG: No results for input(s): GLUCAP in the last 168 hours. Recent Results (from the past 240 hour(s))  Culture, blood (routine x 2)      Status: None (Preliminary result)   Collection Time: 02/26/21  4:18 AM   Specimen: BLOOD  Result Value Ref Range Status   Specimen Description   Final    BLOOD RIGHT ANTECUBITAL Performed at [facility] [address] [place], [place] [address]    Special Requests   Final    BOTTLES DRAWN AEROBIC AND ANAEROBIC Blood Culture results may not be optimal due to an inadequate volume of blood received in culture bottles Performed at [facility] [address], [place], [place] [address]    Culture   Final    NO GROWTH 1 DAY Performed at [facility], [place] [address],  [place], [place] [address]    Report Status PENDING  Incomplete  Culture, blood (routine x 2)     Status: None (Preliminary result)   Collection Time: 02/26/21  4:18 AM   Specimen: BLOOD  Result Value Ref Range Status   Specimen Description   Final    BLOOD RIGHT ARM Performed at [facility] [address], [place], [place] [address]    Special Requests   Final    BOTTLES DRAWN AEROBIC AND ANAEROBIC Blood Culture results may not be optimal due to an inadequate volume of blood received in culture bottles Performed at [facility] [address] [place], [place] [address]    Culture   Final    NO GROWTH 1 DAY Performed at [facility], [place] [address] [place], [place] [address]    Report Status PENDING  Incomplete  Resp Panel by RT-PCR (Flu A&B, Covid)     Status: None   Collection Time: 02/26/21  4:18 AM   Specimen: Nasopharyngeal(NP) swabs in vial transport medium  Result Value Ref Range Status   SARS Coronavirus 2 by RT PCR NEGATIVE NEGATIVE Final    Comment: (NOTE) SARS-CoV-2 target nucleic acids are NOT DETECTED.  The SARS-CoV-2 RNA is generally detectable in upper respiratory specimens during the acute phase of infection. The lowest concentration of SARS-CoV-2 viral copies this assay can detect is 138 copies/mL. A  negative result does not preclude SARS-Cov-2 infection and should not be used as the sole basis for treatment or other patient management decisions. A negative result may occur with  improper specimen collection/handling, submission of specimen other than nasopharyngeal swab, presence of viral mutation(s) within the areas targeted by this assay, and inadequate number of viral copies(<138 copies/mL). A negative result must be combined with clinical observations, patient history, and epidemiological information. The expected result is Negative.  Fact Sheet for Patients:  EntrepreneurPulse.com.au  Fact Sheet for Healthcare Providers:  IncredibleEmployment.be  This test is no t yet approved or cleared by the [place] FDA and  has been authorized for detection and/or diagnosis of SARS-CoV-2 by FDA under an Emergency Use Authorization (EUA). This EUA will remain  in effect (meaning this test can be used) for the duration of the COVID-19 declaration under Section 564(b)(1) of the Act, 21 U.S.C.section 360bbb-3(b)(1), unless the authorization is terminated  or revoked sooner.       Influenza A by PCR NEGATIVE NEGATIVE Final   Influenza B by PCR NEGATIVE NEGATIVE Final    Comment: (NOTE) The Xpert Xpress SARS-CoV-2/FLU/RSV plus assay is intended as an aid in the diagnosis of influenza from Nasopharyngeal swab specimens and should not be used as a sole basis for treatment. Nasal washings and aspirates are unacceptable for Xpert Xpress SARS-CoV-2/FLU/RSV testing.  Fact Sheet for Patients: EntrepreneurPulse.com.au  Fact Sheet for Healthcare Providers: IncredibleEmployment.be  This test is not yet approved or cleared by the [place] FDA and has been authorized for detection and/or diagnosis of SARS-CoV-2 by FDA under an Emergency Use Authorization (EUA). This EUA will remain in effect (meaning this test can  be used) for the duration of the COVID-19 declaration under Section 564(b)(1) of the Act, 21 U.S.C. section 360bbb-3(b)(1), unless the authorization is terminated or revoked.  Performed at [facility], [place] [address], [place], [place] [address]   Urine Culture     Status: None   Collection Time: 02/27/21  9:27 AM   Specimen: Urine, Clean Catch  Result Value  Ref Range Status   Specimen Description   Final    URINE, CLEAN CATCH Performed at [facility], [place] [address] [place], [place] [address]    Special Requests   Final    NONE Performed at [facility], [place] [address], [place], [place] [address]    Culture   Final    NO GROWTH Performed at [facility], [place] [address], [place], [place] [address]    Report Status 02/28/2021 FINAL  Final      Studies: No results found.   Flora Lipps, MD  [facility] 02/28/2021  If 7PM-7AM, please contact night-coverage

## 2021-02-28 NOTE — Progress Notes (Addendum)
   02/28/21 0800  Provider Notification  Provider Name/Title [facility]  Date Provider Notified 02/28/21  Time Provider Notified 0800  Notification Type Page  Notification Reason Other (Comment) (Patient appears under the influence of something, concerned that she has used something in the room.)  Provider response En route  Date of Provider Response 02/28/21  Time of Provider Response 0800   Charge nurse notified. Vital signs stable. Will continue to monitor.

## 2021-02-28 NOTE — Plan of Care (Signed)

## 2021-03-01 DIAGNOSIS — F1410 Cocaine abuse, uncomplicated: Secondary | ICD-10-CM | POA: Diagnosis not present

## 2021-03-01 DIAGNOSIS — N10 Acute tubulo-interstitial nephritis: Secondary | ICD-10-CM | POA: Diagnosis not present

## 2021-03-01 DIAGNOSIS — D509 Iron deficiency anemia, unspecified: Secondary | ICD-10-CM | POA: Diagnosis not present

## 2021-03-01 DIAGNOSIS — E876 Hypokalemia: Secondary | ICD-10-CM | POA: Diagnosis not present

## 2021-03-01 LAB — MAGNESIUM: Magnesium: 1.6 mg/dL — ABNORMAL LOW (ref 1.7–2.4)

## 2021-03-01 LAB — BASIC METABOLIC PANEL
Anion gap: 7 (ref 5–15)
BUN: 10 mg/dL (ref 6–20)
CO2: 25 mmol/L (ref 22–32)
Calcium: 8.1 mg/dL — ABNORMAL LOW (ref 8.9–10.3)
Chloride: 100 mmol/L (ref 98–111)
Creatinine, Ser: 0.75 mg/dL (ref 0.44–1.00)
GFR, Estimated: >60 mL/min (ref >60)
Glucose, Bld: 84 mg/dL (ref 70–99)
Potassium: 3.2 mmol/L — ABNORMAL LOW (ref 3.5–5.1)
Sodium: 132 mmol/L — ABNORMAL LOW (ref 135–145)

## 2021-03-01 LAB — CBC
HCT: 26.8 % — ABNORMAL LOW (ref 36.0–46.0)
Hemoglobin: 8.8 g/dL — ABNORMAL LOW (ref 12.0–15.0)
MCH: 21.4 pg — ABNORMAL LOW (ref 26.0–34.0)
MCHC: 32.8 g/dL (ref 30.0–36.0)
MCV: 65 fL — ABNORMAL LOW (ref 80.0–100.0)
Platelets: 338 10*3/uL (ref 150–400)
RBC: 4.12 MIL/uL (ref 3.87–5.11)
RDW: 20.1 % — ABNORMAL HIGH (ref 11.5–15.5)
WBC: 9.5 10*3/uL (ref 4.0–10.5)
nRBC: 0 % (ref 0.0–0.2)

## 2021-03-01 MED ORDER — LORAZEPAM 0.5 MG PO TABS
0.5000 mg | ORAL_TABLET | Freq: Four times a day (QID) | ORAL | Status: DC | PRN
  Administered 2021-03-01: 0.5 mg via ORAL
  Filled 2021-03-01: qty 1

## 2021-03-01 MED ORDER — MAGNESIUM SULFATE 2 GM/50ML IV SOLN
2.0000 g | Freq: Once | INTRAVENOUS | Status: AC
  Administered 2021-03-01: 2 g via INTRAVENOUS
  Filled 2021-03-01: qty 50

## 2021-03-01 MED ORDER — HYDROMORPHONE HCL 1 MG/ML IJ SOLN
0.5000 mg | INTRAMUSCULAR | Status: DC | PRN
  Administered 2021-03-01 – 2021-03-02 (×6): 0.5 mg via INTRAVENOUS
  Filled 2021-03-01 (×6): qty 0.5

## 2021-03-01 MED ORDER — MAGNESIUM OXIDE -MG SUPPLEMENT 400 (240 MG) MG PO TABS
400.0000 mg | ORAL_TABLET | Freq: Two times a day (BID) | ORAL | Status: DC
  Administered 2021-03-01 (×2): 400 mg via ORAL
  Filled 2021-03-01 (×2): qty 1

## 2021-03-01 MED ORDER — POTASSIUM CHLORIDE CRYS ER 20 MEQ PO TBCR
40.0000 meq | EXTENDED_RELEASE_TABLET | Freq: Two times a day (BID) | ORAL | Status: DC
  Administered 2021-03-01 (×2): 40 meq via ORAL
  Filled 2021-03-01 (×2): qty 2

## 2021-03-01 NOTE — Plan of Care (Signed)
  Problem: Education: Goal: Knowledge of General Education information will improve Description Including pain rating scale, medication(s)/side effects and non-pharmacologic comfort measures Outcome: Progressing   Problem: Clinical Measurements: Goal: Diagnostic test results will improve Outcome: Progressing Goal: Respiratory complications will improve Outcome: Progressing   

## 2021-03-01 NOTE — Progress Notes (Addendum)
PROGRESS NOTE  Terri [name] TDH:[id number] DOB: [birth year] DOA: 02/25/2021 PCP: Patient, No Pcp Per (Inactive)   LOS: 2 days   Brief narrative:  Terri [name] is a 45 y.o. female with medical history significant of osteoarthritis of multiple sites, chronic back pain, sciatica, bipolar disorder, depression, anxiety, history of DVTs in her 18s, hypertension, migraine headaches, unspecified neuropathy, history of pneumonia, history of PPD positivity, stress incontinence, history of E. coli bacteremia presented to hospital with worsening left flank pain fever fatigue malaise.  Patient does have chronic abdominal pain which was exacerbated by use of cocaine 3 to 4 days back.  In the ED, patient was noted to be hypertensive.  Patient received normal saline bolus IV Zosyn, Dilaudid fentanyl and was admitted to hospital.  Urinalysis showed leukocyte esterase 21-50 white cells.  Urine drug screen was positive for cocaine.  Lactate was elevated at 2.4.  CBC showed elevated white count at 20.2 with 88% neutrophils.  X-ray was negative.  CT scan of the abdomen and pelvis showed acute left pyelonephritis.  Patient was then admitted hospital for further evaluation and treatment.  Assessment/Plan:  Principal Problem:   Acute pyelonephritis Active Problems:   Hypokalemia   Cocaine abuse (HCC)   Tobacco abuse   Microcytic anemia   Mild protein malnutrition (HCC)  Acute pyelonephritis Continue IV Rocephin,  Urine culture negative so far.  Blood culture negative in 2 days.  On IV Dilaudid oxycodone muscle relaxant and Lidoderm patch for pain.    T-max of 99.5 F.  Leukocytosis has resolved.  We will decrease the dose of IV Dilaudid.   Hypokalemia Continue to replenish.  Check level in a.m.  Hypomagnesemia.  We will replenish through IV and orally.  Check levels in a.m.  Accelerated hypertension.  Likely secondary to cocaine abuse and anxiety.  O much better on the current regimen.  Currently on amlodipine  Ativan  Cocaine abuse  Transition of care has been consulted for transition of care.  On Nitropaste and Ativan   Chronic pain syndrome, back pain.  Patient on narcotics as outpatient.  Try to avoid IV narcotics.     Microcytic anemia Latest hemoglobin of 8.8   Mild protein calorie malnutrition  Continue nutritional supplements  Deconditioning, debility.  We will get PT evaluation.   DVT prophylaxis: SCDs Start: 02/26/21 1003  Code Status: Full code  Family Communication:  Patient's boyfriend at bedside  Status is: Inpatient  Remains inpatient appropriate because:IV treatments appropriate due to intensity of illness or inability to take PO and Inpatient level of care appropriate due to severity of illness  Dispo: The patient is from: Home, patient states that this is homeless and wishes to talk to a Education officer, museum.              Anticipated d/c is to: Home              Patient currently is not medically stable to d/c.   Difficult to place patient No  Consultants: None  Procedures: None  Anti-infectives:  Rocephin IV  Anti-infectives (From admission, onward)    Start     Dose/Rate Route Frequency Ordered Stop   02/26/21 1000  cefTRIAXone (ROCEPHIN) 2 g in sodium chloride 0.9 % 100 mL IVPB        2 g 200 mL/hr over 30 Minutes Intravenous Daily 02/26/21 0733 03/05/21 0959   02/26/21 0345  piperacillin-tazobactam (ZOSYN) IVPB 3.375 g        3.375 g 100  mL/hr over 30 Minutes Intravenous  Once 02/26/21 0338 02/26/21 0520      Subjective: Today, she was seen and examined at bedside.  Patient states that she slept little better today.  But still complains of intermittent pain in her body including the back and chest.  Patient states that she is homeless and wishes to talk to a social worker  Objective: Vitals:   03/01/21 0235 03/01/21 0546  BP: (!) 153/99 133/86  Pulse: 87 76  Resp: 20 20  Temp: 98.5 F (36.9 C) 98.6 F (37 C)  SpO2: 94% 95%    Intake/Output  Summary (Last 24 hours) at 03/01/2021 0829 Last data filed at 02/28/2021 1400 Gross per 24 hour  Intake 1200 ml  Output 350 ml  Net 850 ml    Filed Weights   02/26/21 1708  Weight: 59.9 kg   Body mass index is 19.5 kg/m.   Physical Exam: GENERAL: Patient alert awake and communicative, thinly built, HENT: No scleral pallor or icterus. Pupils equally reactive to light. Oral mucosa is moist NECK: is supple, no gross swelling noted. CHEST: Clear to auscultation. No crackles or wheezes.  Diminished breath sounds bilaterally.  Nonspecific tenderness over the chest wall spinal and lower back area.   CVS: S1 and S2 heard, no murmur. Regular rate and rhythm.  ABDOMEN: Soft, non-tender, bowel sounds are present. EXTREMITIES: No edema. CNS: Cranial nerves are intact. No focal motor deficits, thinly built. SKIN: warm and dry without rashes.  Data Review: I have personally reviewed the following laboratory data and studies,  CBC: Recent Labs  Lab 02/25/21 2349 02/27/21 0355 02/28/21 0350 03/01/21 0346  WBC 20.2* 14.1* 12.3* 9.5  NEUTROABS 17.6* 12.0*  --   --   HGB 10.9* 9.7* 8.6* 8.8*  HCT 32.2* 29.1* 26.0* 26.8*  MCV 65.3* 65.0* 65.0* 65.0*  PLT 387 338 344 696    Basic Metabolic Panel: Recent Labs  Lab 02/25/21 2349 02/26/21 0347 02/27/21 0355 02/28/21 0350 03/01/21 0346  NA 136 132* 136 131* 132*  K 2.9* 2.7* 3.6 3.7 3.2*  CL 97* 93* 103 96* 100  CO2 29 31 24 25 25   GLUCOSE 115* 79 86 70 84  BUN 11 12 9 9 10   CREATININE 0.80 0.83 0.61 0.77 0.75  CALCIUM 9.0 9.1 8.3* 8.3* 8.1*  MG  --  1.7 1.7 1.7 1.6*  PHOS  --  3.9  --   --   --     Liver Function Tests: Recent Labs  Lab 02/25/21 2349  AST 17  ALT 14  ALKPHOS 108  BILITOT 1.0  PROT 6.7  ALBUMIN 3.0*    Recent Labs  Lab 02/26/21 0347  LIPASE 27    No results for input(s): AMMONIA in the last 168 hours. Cardiac Enzymes: No results for input(s): CKTOTAL, CKMB, CKMBINDEX, TROPONINI in the last 168  hours. BNP (last 3 results) No results for input(s): BNP in the last 8760 hours.  ProBNP (last 3 results) No results for input(s): PROBNP in the last 8760 hours.  CBG: No results for input(s): GLUCAP in the last 168 hours. Recent Results (from the past 240 hour(s))  Culture, blood (routine x 2)     Status: None (Preliminary result)   Collection Time: 02/26/21  4:18 AM   Specimen: BLOOD  Result Value Ref Range Status   Specimen Description   Final    BLOOD RIGHT ANTECUBITAL Performed at [facility] [address] [place], [place] [address]  Special Requests   Final    BOTTLES DRAWN AEROBIC AND ANAEROBIC Blood Culture results may not be optimal due to an inadequate volume of blood received in culture bottles Performed at [facility] [address], [place], [place] [address]    Culture   Final    NO GROWTH 2 DAYS Performed at [facility] [address], [place], [place] [address]    Report Status PENDING  Incomplete  Culture, blood (routine x 2)     Status: None (Preliminary result)   Collection Time: 02/26/21  4:18 AM   Specimen: BLOOD  Result Value Ref Range Status   Specimen Description   Final    BLOOD RIGHT ARM Performed at [facility] [address], [place], [place] [address]    Special Requests   Final    BOTTLES DRAWN AEROBIC AND ANAEROBIC Blood Culture results may not be optimal due to an inadequate volume of blood received in culture bottles Performed at [facility] [address], [place], [place] [address]    Culture   Final    NO GROWTH 2 DAYS Performed at [facility] [address] [place], [place] [address]    Report Status PENDING  Incomplete  Resp Panel by RT-PCR (Flu A&B, Covid)     Status: None   Collection Time: 02/26/21  4:18 AM   Specimen: Nasopharyngeal(NP) swabs in vial transport medium  Result Value Ref Range Status   SARS Coronavirus 2 by RT  PCR NEGATIVE NEGATIVE Final    Comment: (NOTE) SARS-CoV-2 target nucleic acids are NOT DETECTED.  The SARS-CoV-2 RNA is generally detectable in upper respiratory specimens during the acute phase of infection. The lowest concentration of SARS-CoV-2 viral copies this assay can detect is 138 copies/mL. A negative result does not preclude SARS-Cov-2 infection and should not be used as the sole basis for treatment or other patient management decisions. A negative result may occur with  improper specimen collection/handling, submission of specimen other than nasopharyngeal swab, presence of viral mutation(s) within the areas targeted by this assay, and inadequate number of viral copies(<138 copies/mL). A negative result must be combined with clinical observations, patient history, and epidemiological information. The expected result is Negative.  Fact Sheet for Patients:  EntrepreneurPulse.com.au  Fact Sheet for Healthcare Providers:  IncredibleEmployment.be  This test is no t yet approved or cleared by the [place] FDA and  has been authorized for detection and/or diagnosis of SARS-CoV-2 by FDA under an Emergency Use Authorization (EUA). This EUA will remain  in effect (meaning this test can be used) for the duration of the COVID-19 declaration under Section 564(b)(1) of the Act, 21 U.S.C.section 360bbb-3(b)(1), unless the authorization is terminated  or revoked sooner.       Influenza A by PCR NEGATIVE NEGATIVE Final   Influenza B by PCR NEGATIVE NEGATIVE Final    Comment: (NOTE) The Xpert Xpress SARS-CoV-2/FLU/RSV plus assay is intended as an aid in the diagnosis of influenza from Nasopharyngeal swab specimens and should not be used as a sole basis for treatment. Nasal washings and aspirates are unacceptable for Xpert Xpress SARS-CoV-2/FLU/RSV testing.  Fact Sheet for Patients: EntrepreneurPulse.com.au  Fact Sheet for  Healthcare Providers: IncredibleEmployment.be  This test is not yet approved or cleared by the [place] FDA and has been authorized for detection and/or diagnosis of SARS-CoV-2 by FDA under an Emergency Use Authorization (EUA). This EUA will remain in effect (meaning this test  can be used) for the duration of the COVID-19 declaration under Section 564(b)(1) of the Act, 21 U.S.C. section 360bbb-3(b)(1), unless the authorization is terminated or revoked.  Performed at [facility], [place] [address] [place], [place] [address]   Urine Culture     Status: None   Collection Time: 02/27/21  9:27 AM   Specimen: Urine, Clean Catch  Result Value Ref Range Status   Specimen Description   Final    URINE, CLEAN CATCH Performed at [facility], [place] [address], [place], [place] [address]    Special Requests   Final    NONE Performed at [facility], [place] [address], [place], [place] [address]    Culture   Final    NO GROWTH Performed at [facility], [place] [address], [place],  [address]    Report Status 02/28/2021 FINAL  Final      Studies: No results found.   Flora Lipps, MD  [facility] 03/01/2021  If 7PM-7AM, please contact night-coverage

## 2021-03-02 NOTE — Progress Notes (Signed)
RN was informed by NT that patient was suddenly insisting on leaving. RN went to room to talk with patient who was fully undressed and putting on street clothes. RN informed patient that the MD was planning to discharge her and RN asked if patient would wait until MD came to talk with her. RN located MD on the floor who was planning to see her next after finishing his conversation with another patient family member. Patient refused to wait but did sign AMA paperwork. Before RN could remove patient IV, patient pulled it out herself. Patient then proceeded down the hall to leave. RN updated MD about patient.

## 2021-03-02 NOTE — Progress Notes (Signed)
Patient refused AM labs

## 2021-03-03 LAB — CULTURE, BLOOD (ROUTINE X 2)
Culture: NO GROWTH
Culture: NO GROWTH

## 2021-03-09 NOTE — Discharge Summary (Signed)
Physician Discharge Summary  Terri [name] NAT:[id number] DOB: July 25, 1975 DOA: 02/25/2021  PCP: Patient, No Pcp Per (Inactive)  Admit date: 02/25/2021 Discharge date: 03/02/2021  Admitted From: Home  Discharge disposition: Left AGAINST MEDICAL ADVICE   Recommendations for Outpatient Follow-Up:   Patient left AGAINST MEDICAL ADVICE and discharge instructions  Discharge Diagnosis:   Principal Problem:   Acute pyelonephritis Active Problems:   Hypokalemia   Cocaine abuse ([place])   Tobacco abuse   Microcytic anemia   Mild protein malnutrition ([place])    Discharge Condition: Improved.  Diet recommendation: Low sodium, heart healthy.    Wound care: None.  Code status: Full.   History of Present Illness:    Terri [name] is a 44 y.o. 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 coli bacteremia presented to hospital with worsening left flank pain fever fatigue malaise.  Patient does have chronic abdominal pain which was exacerbated by use of cocaine 3 to 4 days prior to presentation..  In the ED, patient was noted to be hypertensive.  Patient received normal saline bolus IV Zosyn, Dilaudid fentanyl and was admitted to hospital.  Urinalysis showed leukocyte esterase 21-50 white cells.  Urine drug screen was positive for cocaine.  Lactate was elevated at 2.4.  CBC showed elevated white count at 20.2 with 88% neutrophils.  X-ray was negative.  CT scan of the abdomen and pelvis showed acute left pyelonephritis.  Patient was then admitted hospital for further evaluation and treatment.   Hospital Course:   Following conditions were addressed during hospitalization as listed below,  Acute pyelonephritis Patient received  IV Rocephin,  Urine culture was negative.  Blood culture given to the time of discharge.   Leukocytosis had resolved.  Patient however decided to leave [place].   Hypokalemia Replenished   Hypomagnesemia.  Replenished.   Accelerated hypertension.  Likely secondary to cocaine abuse and anxiety.  Received amlodipine Ativan during hospitalization.   Cocaine abuse  Transition of care  was consulted for transition of care.  Received Nitropaste and Ativan during hospitalization.   Chronic pain syndrome, back pain.  On narcotics as outpatient    Microcytic anemia Latest hemoglobin of 8.8   Mild protein calorie malnutrition  On presentation.  Was on nutritional supplements.   Deconditioning, debility.   Patient left AGAINST MEDICAL ADVICE prior to PT evaluation   Disposition.  Patient had left AGAINST MEDICAL ADVICE without informing the staff.  Medical Consultants:   None.  Procedures:    None Subjective:   Today, left prior to rounding  Discharge Exam:   Vitals:   03/01/21 2015 03/02/21 0654  BP: (!) 152/96 (!) 169/97  Pulse: 96 87  Resp: 20 20  Temp: 100.3 F (37.9 C) 98.6 F (37 C)  SpO2: 99% 100%   Vitals:   03/01/21 0235 03/01/21 0546 03/01/21 2015 03/02/21 0654  BP: (!) 153/99 133/86 (!) 152/96 (!) 169/97  Pulse: 87 76 96 87  Resp: 20 20 20 20   Temp: 98.5 F (36.9 C) 98.6 F (37 C) 100.3 F (37.9 C) 98.6 F (37 C)  TempSrc: Oral Oral Oral   SpO2: 94% 95% 99% 100%  Weight:      Height:        The results of significant diagnostics from this hospitalization (including  imaging, microbiology, ancillary and laboratory) are listed below for reference.     Diagnostic Studies:   CT Chest W Contrast  Result Date: 02/26/2021 CLINICAL DATA:  45 year old female with increasing upper abdominal pain. EXAM: CT CHEST, ABDOMEN, AND PELVIS WITH CONTRAST TECHNIQUE: Multidetector CT imaging of the chest, abdomen and pelvis was performed following the  standard protocol during bolus administration of intravenous contrast. CONTRAST:  49mL OMNIPAQUE IOHEXOL 350 MG/ML SOLN COMPARISON:  CT Abdomen and Pelvis 01/17/2021 and earlier. Chest radiograph 0556 hours today. Neck CT 09/25/2017. FINDINGS: CT CHEST FINDINGS Cardiovascular: Calcified aortic atherosclerosis. No cardiomegaly or pericardial effusion. Calcified coronary artery atherosclerosis is widespread (series 2, image 30). The central pulmonary arteries are enhanced and appear to be patent. Mediastinum/Nodes: Negative. No mediastinal mass or lymphadenopathy. Lungs/Pleura: Major airways are patent with mild bilateral bronchial wall thickening. Occasional subpleural lung scarring (anterior right upper lobe series 5, image 42). Patchy dependent opacity in the costophrenic angles more resembles atelectasis than infection. There is questionable early centrilobular emphysema in the upper lobes. No pleural effusion or other abnormal pulmonary opacity. Musculoskeletal: No acute or suspicious osseous lesion in the chest. CT ABDOMEN PELVIS FINDINGS Hepatobiliary: Chronically absent gallbladder. Stable liver with mild postoperative bile duct enlargement. Pancreas: Stable pancreatic atrophy. Spleen: Stable mild splenomegaly. Adrenals/Urinary Tract: New heterogeneous enhancement of the left kidney (series 12, image 64) typical for acute pyelonephritis. No left hydronephrosis. Right renal enhancement appears stable and within normal limits. Adrenal glands remain normal. On the delayed images there is fairly symmetric contrast excretion from both kidneys. No discrete pararenal fluid collection. There is mild urothelial thickening at the left renal pelvis. Unremarkable bladder. Stomach/Bowel: Generalized mesenteric edema throughout the abdomen and pelvis. No dilated loops. 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 No discrete bowel inflammation or free air identified. Evidence  of a normal gas containing appendix on coronal image 86. Vascular/Lymphatic: Advanced Aortoiliac calcified atherosclerosis. Major arterial structures remain patent. Portal venous system is inadequately opacified but the main portal vein does appear to be patent. No lymphadenopathy identified. Reproductive: Negative.  Numerous pelvic phleboliths. Other: New pelvic free fluid, moderate volume with simple fluid density (series 2, image 114). Generalized body wall edema has not significantly changed since August. Musculoskeletal: Age advanced lumbar spine degeneration with previous posterior lumbar decompression. Advanced chronic disc and endplate degeneration from L3 to the sacrum. No acute osseous abnormality identified. IMPRESSION: 1. Acute Left Pyelonephritis. No associated renal abscess or other complicating features. 2. New moderate volume pelvic free fluid since August. This is nonspecific, but perhaps related to anasarca given the appearance of continued body wall edema and new generalized mesenteric congestion since August. 3. No discrete bowel inflammation is evident. 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 4. No acute or inflammatory process identified in the chest. No pleural effusion. 5. Advanced calcified coronary artery and Aortic Atherosclerosis (ICD10-I70.0). Electronically Signed   By: Genevie Ann M.D.   On: 02/26/2021 07:08   CT Abdomen Pelvis W Contrast  Result Date: 02/26/2021 CLINICAL DATA:  45 year old female with increasing upper abdominal pain. EXAM: CT CHEST, ABDOMEN, AND PELVIS WITH CONTRAST TECHNIQUE: Multidetector CT imaging of the chest, abdomen and pelvis was performed following the standard protocol during bolus administration of intravenous contrast. CONTRAST:  41mL OMNIPAQUE IOHEXOL 350 MG/ML SOLN COMPARISON:  CT Abdomen and Pelvis 01/17/2021 and earlier. Chest radiograph 0556 hours today. Neck CT 09/25/2017. FINDINGS: CT CHEST FINDINGS Cardiovascular:  Calcified aortic atherosclerosis. No cardiomegaly or  pericardial effusion. Calcified coronary artery atherosclerosis is widespread (series 2, image 30). The central pulmonary arteries are enhanced and appear to be patent. Mediastinum/Nodes: Negative. No mediastinal mass or lymphadenopathy. Lungs/Pleura: Major airways are patent with mild bilateral bronchial wall thickening. Occasional subpleural lung scarring (anterior right upper lobe series 5, image 42). Patchy dependent opacity in the costophrenic angles more resembles atelectasis than infection. There is questionable early centrilobular emphysema in the upper lobes. No pleural effusion or other abnormal pulmonary opacity. Musculoskeletal: No acute or suspicious osseous lesion in the chest. CT ABDOMEN PELVIS FINDINGS Hepatobiliary: Chronically absent gallbladder. Stable liver with mild postoperative bile duct enlargement. Pancreas: Stable pancreatic atrophy. Spleen: Stable mild splenomegaly. Adrenals/Urinary Tract: New heterogeneous enhancement of the left kidney (series 12, image 64) typical for acute pyelonephritis. No left hydronephrosis. Right renal enhancement appears stable and within normal limits. Adrenal glands remain normal. On the delayed images there is fairly symmetric contrast excretion from both kidneys. No discrete pararenal fluid collection. There is mild urothelial thickening at the left renal pelvis. Unremarkable bladder. Stomach/Bowel: Generalized mesenteric edema throughout the abdomen and pelvis. No dilated loops. 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 No discrete bowel inflammation or free air identified. Evidence of a normal gas containing appendix on coronal image 86. Vascular/Lymphatic: Advanced Aortoiliac calcified atherosclerosis. Major arterial structures remain patent. Portal venous system is inadequately opacified but the main portal vein does appear to be patent. No  lymphadenopathy identified. Reproductive: Negative.  Numerous pelvic phleboliths. Other: New pelvic free fluid, moderate volume with simple fluid density (series 2, image 114). Generalized body wall edema has not significantly changed since August. Musculoskeletal: Age advanced lumbar spine degeneration with previous posterior lumbar decompression. Advanced chronic disc and endplate degeneration from L3 to the sacrum. No acute osseous abnormality identified. IMPRESSION: 1. Acute Left Pyelonephritis. No associated renal abscess or other complicating features. 2. New moderate volume pelvic free fluid since August. This is nonspecific, but perhaps related to anasarca given the appearance of continued body wall edema and new generalized mesenteric congestion since August. 3. No discrete bowel inflammation is evident. 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 4. No acute or inflammatory process identified in the chest. No pleural effusion. 5. Advanced calcified coronary artery and Aortic Atherosclerosis (ICD10-I70.0). Electronically Signed   By: Genevie Ann M.D.   On: 02/26/2021 07:08   DG Chest Port 1 View  Result Date: 02/26/2021 CLINICAL DATA:  45 year old female with history of fever and worsening upper abdominal pain. EXAM: PORTABLE CHEST 1 VIEW COMPARISON:  Chest x-ray 09/17/2020. FINDINGS: Skin fold artifacts are noted bilaterally. Lung volumes are normal. No consolidative airspace disease. No pleural effusions. No pneumothorax. No pulmonary nodule or mass noted. Pulmonary vasculature and the cardiomediastinal silhouette are within normal limits. IMPRESSION: 1.  No radiographic evidence of acute cardiopulmonary disease. Electronically Signed   By: Vinnie Langton M.D.   On: 02/26/2021 06:13     Labs:   Basic Metabolic Panel: No results for input(s): NA, K, CL, CO2, GLUCOSE, BUN, CREATININE, CALCIUM, MG, PHOS in the last 168 hours. GFR Estimated Creatinine Clearance: 82.2 mL/min  (by C-G formula based on SCr of 0.75 mg/dL). Liver Function Tests: No results for input(s): AST, ALT, ALKPHOS, BILITOT, PROT, ALBUMIN in the last 168 hours. No results for input(s): LIPASE, AMYLASE in the last 168 hours. No results for input(s): AMMONIA in the last 168 hours. Coagulation profile No results for input(s): INR, PROTIME in the last 168  hours.  CBC: No results for input(s): WBC, NEUTROABS, HGB, HCT, MCV, PLT in the last 168 hours. Cardiac Enzymes: No results for input(s): CKTOTAL, CKMB, CKMBINDEX, TROPONINI in the last 168 hours. BNP: Invalid input(s): POCBNP CBG: No results for input(s): GLUCAP in the last 168 hours. D-Dimer No results for input(s): DDIMER in the last 72 hours. Hgb A1c No results for input(s): HGBA1C in the last 72 hours. Lipid Profile No results for input(s): CHOL, HDL, LDLCALC, TRIG, CHOLHDL, LDLDIRECT in the last 72 hours. Thyroid function studies No results for input(s): TSH, T4TOTAL, T3FREE, THYROIDAB in the last 72 hours.  Invalid input(s): FREET3 Anemia work up No results for input(s): VITAMINB12, FOLATE, FERRITIN, TIBC, IRON, RETICCTPCT in the last 72 hours. Microbiology Recent Results (from the past 240 hour(s))  Urine Culture     Status: None   Collection Time: 02/27/21  9:27 AM   Specimen: Urine, Clean Catch  Result Value Ref Range Status   Specimen Description   Final    URINE, CLEAN CATCH Performed at [facility], [place] [address] [place], [place] [address]    Special Requests   Final    NONE Performed at [facility], [place] [address] [place], [place] [address]    Culture   Final    NO GROWTH Performed at [facility], [place] [address] [place], [place] [address]    Report Status 02/28/2021 FINAL  Final     Discharge Instructions:    Allergies as of 03/02/2021       Reactions   Ketorolac Tromethamine Itching, Nausea And Vomiting   Methocarbamol Diarrhea        Medication  List     ASK your doctor about these medications    acetaminophen 500 MG tablet Commonly known as: TYLENOL Take 1 tablet (500 mg total) by mouth every 6 (six) hours as needed for moderate pain.   famotidine 20 MG tablet Commonly known as: PEPCID Take 20 mg by mouth 2 (two) times daily as needed for heartburn or indigestion.        Follow-up Information     Primary care provider. Schedule an appointment as soon as possible for a visit in 1 week(s).                   Time coordinating discharge: 39 minutes  Signed:  Jameelah Watts  [facility] 03/09/2021, 7:38 AM

## 2021-03-12 ENCOUNTER — Emergency Department (HOSPITAL_COMMUNITY): Payer: Medicaid Other

## 2021-03-12 ENCOUNTER — Emergency Department (HOSPITAL_COMMUNITY)
Admission: EM | Admit: 2021-03-12 | Discharge: 2021-03-12 | Disposition: A | Discharge status: Home / Self Care | Payer: Medicaid Other | Attending: Emergency Medicine | Admitting: Emergency Medicine

## 2021-03-12 ENCOUNTER — Encounter (HOSPITAL_COMMUNITY): Payer: Self-pay | Admitting: Emergency Medicine

## 2021-03-12 DIAGNOSIS — R109 Unspecified abdominal pain: Secondary | ICD-10-CM | POA: Diagnosis present

## 2021-03-12 DIAGNOSIS — F17210 Nicotine dependence, cigarettes, uncomplicated: Secondary | ICD-10-CM | POA: Diagnosis not present

## 2021-03-12 DIAGNOSIS — I10 Essential (primary) hypertension: Secondary | ICD-10-CM | POA: Insufficient documentation

## 2021-03-12 DIAGNOSIS — E876 Hypokalemia: Secondary | ICD-10-CM | POA: Insufficient documentation

## 2021-03-12 DIAGNOSIS — N9489 Other specified conditions associated with female genital organs and menstrual cycle: Secondary | ICD-10-CM | POA: Insufficient documentation

## 2021-03-12 DIAGNOSIS — A599 Trichomoniasis, unspecified: Secondary | ICD-10-CM | POA: Insufficient documentation

## 2021-03-12 LAB — CBC WITH DIFFERENTIAL/PLATELET
Abs Immature Granulocytes: 0.03 10*3/uL (ref 0.00–0.07)
Basophils Absolute: 0 10*3/uL (ref 0.0–0.1)
Basophils Relative: 0 %
Eosinophils Absolute: 0 10*3/uL (ref 0.0–0.5)
Eosinophils Relative: 0 %
HCT: 31.1 % — ABNORMAL LOW (ref 36.0–46.0)
Hemoglobin: 10.2 g/dL — ABNORMAL LOW (ref 12.0–15.0)
Immature Granulocytes: 0 %
Lymphocytes Relative: 22 %
Lymphs Abs: 2.4 10*3/uL (ref 0.7–4.0)
MCH: 22 pg — ABNORMAL LOW (ref 26.0–34.0)
MCHC: 32.8 g/dL (ref 30.0–36.0)
MCV: 67 fL — ABNORMAL LOW (ref 80.0–100.0)
Monocytes Absolute: 0.4 10*3/uL (ref 0.1–1.0)
Monocytes Relative: 4 %
Neutro Abs: 8.1 10*3/uL — ABNORMAL HIGH (ref 1.7–7.7)
Neutrophils Relative %: 74 %
Platelets: 438 10*3/uL — ABNORMAL HIGH (ref 150–400)
RBC: 4.64 MIL/uL (ref 3.87–5.11)
RDW: 20.7 % — ABNORMAL HIGH (ref 11.5–15.5)
WBC: 10.9 10*3/uL — ABNORMAL HIGH (ref 4.0–10.5)
nRBC: 0 % (ref 0.0–0.2)

## 2021-03-12 LAB — COMPREHENSIVE METABOLIC PANEL
ALT: 11 U/L (ref 0–44)
AST: 13 U/L — ABNORMAL LOW (ref 15–41)
Albumin: 3.4 g/dL — ABNORMAL LOW (ref 3.5–5.0)
Alkaline Phosphatase: 92 U/L (ref 38–126)
Anion gap: 9 (ref 5–15)
BUN: 20 mg/dL (ref 6–20)
CO2: 27 mmol/L (ref 22–32)
Calcium: 9 mg/dL (ref 8.9–10.3)
Chloride: 95 mmol/L — ABNORMAL LOW (ref 98–111)
Creatinine, Ser: 0.84 mg/dL (ref 0.44–1.00)
GFR, Estimated: >60 mL/min (ref >60)
Glucose, Bld: 96 mg/dL (ref 70–99)
Potassium: 3.2 mmol/L — ABNORMAL LOW (ref 3.5–5.1)
Sodium: 131 mmol/L — ABNORMAL LOW (ref 135–145)
Total Bilirubin: 0.4 mg/dL (ref 0.3–1.2)
Total Protein: 7.3 g/dL (ref 6.5–8.1)

## 2021-03-12 LAB — URINALYSIS, ROUTINE W REFLEX MICROSCOPIC
Glucose, UA: NEGATIVE mg/dL
Hgb urine dipstick: NEGATIVE
Ketones, ur: NEGATIVE mg/dL
Nitrite: NEGATIVE
Protein, ur: 30 mg/dL — AB
Specific Gravity, Urine: >1.03 — ABNORMAL HIGH (ref 1.005–1.030)
pH: 6 (ref 5.0–8.0)

## 2021-03-12 LAB — URINALYSIS, MICROSCOPIC (REFLEX)

## 2021-03-12 LAB — I-STAT BETA HCG BLOOD, ED (MC, WL, AP ONLY): I-stat hCG, quantitative: <5 m[IU]/mL (ref <5)

## 2021-03-12 LAB — LIPASE, BLOOD: Lipase: 32 U/L (ref 11–51)

## 2021-03-12 MED ORDER — POTASSIUM CHLORIDE CRYS ER 20 MEQ PO TBCR
40.0000 meq | EXTENDED_RELEASE_TABLET | Freq: Once | ORAL | Status: AC
  Administered 2021-03-12: 40 meq via ORAL
  Filled 2021-03-12: qty 2

## 2021-03-12 MED ORDER — LORAZEPAM 2 MG/ML IJ SOLN
0.5000 mg | Freq: Once | INTRAMUSCULAR | Status: AC
  Administered 2021-03-12: 0.5 mg via INTRAVENOUS
  Filled 2021-03-12: qty 1

## 2021-03-12 MED ORDER — LIDOCAINE HCL (PF) 1 % IJ SOLN
INTRAMUSCULAR | Status: AC
  Filled 2021-03-12: qty 30

## 2021-03-12 MED ORDER — SODIUM CHLORIDE 0.9 % IV SOLN: INTRAVENOUS | Status: DC

## 2021-03-12 MED ORDER — SODIUM CHLORIDE 0.9 % IV BOLUS
1000.0000 mL | Freq: Once | INTRAVENOUS | Status: AC
  Administered 2021-03-12: 1000 mL via INTRAVENOUS

## 2021-03-12 MED ORDER — METRONIDAZOLE 500 MG PO TABS
500.0000 mg | ORAL_TABLET | Freq: Two times a day (BID) | ORAL | 0 refills | Status: DC
Start: 2021-03-12 — End: 2021-03-18

## 2021-03-12 MED ORDER — CEFTRIAXONE SODIUM 1 G IJ SOLR
500.0000 mg | Freq: Once | INTRAMUSCULAR | Status: AC
  Administered 2021-03-12: 500 mg via INTRAMUSCULAR
  Filled 2021-03-12: qty 10

## 2021-03-12 MED ORDER — METOCLOPRAMIDE HCL 5 MG/ML IJ SOLN
5.0000 mg | Freq: Once | INTRAMUSCULAR | Status: AC
  Administered 2021-03-12: 5 mg via INTRAVENOUS
  Filled 2021-03-12: qty 2

## 2021-03-12 MED ORDER — DOXYCYCLINE HYCLATE 100 MG PO CAPS: 100.0000 mg | ORAL_CAPSULE | Freq: Two times a day (BID) | ORAL | 0 refills | Status: DC

## 2021-03-12 MED ORDER — MORPHINE SULFATE (PF) 4 MG/ML IV SOLN
6.0000 mg | Freq: Once | INTRAVENOUS | Status: AC
  Administered 2021-03-12: 6 mg via INTRAVENOUS
  Filled 2021-03-12: qty 2

## 2021-03-12 NOTE — ED Provider Notes (Signed)
[facility] DEPT Provider Note   CSN: [id number] Arrival date & time: 03/12/21  2019     History Chief Complaint  Patient presents with   Abdominal Pain    LIBRA GATZ is a 45 y.o. female.  45 year old female presents with worsening ongoing abdominal pain and concern for possible recurrence of her UTI.  Was in the hospital 2 days ago for left-sided pyelonephritis but left AMA.  Since that time she is continued pain of left flank pain has not had any dysuria or fever.  Has had sharp pain without hematuria.  She has had emesis x1 and states that she has had chronic abdominal pain for some time was starts in upper belly and goes up into her chest.  No cardiac symptoms.  Does admit to using cocaine and heroin 3 days ago.     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   Persistent fever 02/09/2019   COVID-19 virus not detected    Chest pain     Bacteremia due to Escherichia coli    Esophagitis    Hypokalemia    Hypomagnesemia    Sepsis ([place]) 02/02/2019   Leucocytosis 02/02/2019   Thrombocytosis 02/02/2019   UTI (urinary tract infection) 02/02/2019   GIB (gastrointestinal bleeding) 09/13/2017   Apnea 09/13/2017   Essential hypertension    Cellulitis and abscess of neck 08/12/2017   Cellulitis of submandibular region 08/10/2017   Odontogenic infection of jaw 08/10/2017   Cholecystitis 02/14/2017   Opioid abuse with opioid-induced mood disorder ([place]) 01/25/2017   Anxiety 02/11/2014   Depression 07/12/2012    Past Surgical History:  Procedure Laterality Date   ALVEOLOPLASTY Bilateral 01/31/2018   Procedure: ALVEOLOPLASTY;  Surgeon: Diona Browner, DDS;  Location: [facility];  Service: Oral Surgery;  Laterality: Bilateral;   ANKLE SURGERY Left 1992   with hardware   BACK SURGERY     BIOPSY  02/08/2019   Procedure: BIOPSY;  Surgeon: Rush Landmark Telford Nab., MD;  Location: [facility];  Service: Gastroenterology;;   CESAREAN SECTION     CHOLECYSTECTOMY N/A 02/15/2017   Procedure: LAPAROSCOPIC CHOLECYSTECTOMY;  Surgeon: Clovis Riley, MD;  Location: [facility];  Service: General;  Laterality: N/A;   ENTEROSCOPY N/A 02/08/2019   Procedure: ENTEROSCOPY;  Surgeon: Irving Copas., MD;  Location: [facility];  Service: Gastroenterology;  Laterality: N/A;   ESOPHAGOGASTRODUODENOSCOPY N/A 01/17/2021   Procedure: ESOPHAGOGASTRODUODENOSCOPY (EGD);  Surgeon: Juanita Craver, MD;  Location: [facility];  Service: Endoscopy;  Laterality: N/A;   ESOPHAGOGASTRODUODENOSCOPY (EGD) WITH PROPOFOL N/A 09/15/2017   Procedure: ESOPHAGOGASTRODUODENOSCOPY (EGD) WITH PROPOFOL;  Surgeon: Clarene Essex, MD;  Location: [facility];  Service: Endoscopy;  Laterality: N/A;   ESOPHAGOGASTRODUODENOSCOPY (EGD) WITH PROPOFOL N/A 02/08/2019   Procedure: ESOPHAGOGASTRODUODENOSCOPY (EGD) WITH PROPOFOL;  Surgeon: Rush Landmark Telford Nab., MD;  Location: [facility];   Service: Gastroenterology;  Laterality: N/A;   GASTRIC BYPASS     INCISION AND DRAINAGE OF PERITONSILLAR ABCESS Left 08/13/2017   Procedure: INCISION AND DRAINAGE OF LEFT NECK ABSCESS;  Surgeon: Melida Quitter, MD;  Location: [facility];  Service: ENT;  Laterality: Left;   LAPAROSCOPIC LYSIS OF ADHESIONS  02/15/2017   Procedure: LAPAROSCOPIC LYSIS OF ADHESIONS;  Surgeon: Clovis Riley, MD;  Location: [facility];  Service: General;;   LUMBAR DISC SURGERY     LUMBAR FUSION     SAVORY DILATION N/A 02/08/2019   Procedure: SAVORY DILATION;  Surgeon: Irving Copas., MD;  Location: [facility];  Service: Gastroenterology;  Laterality: N/A;   TOOTH EXTRACTION Bilateral 01/31/2018   Procedure: DENTAL RESTORATION/EXTRACTIONS;  Surgeon: Diona Browner, DDS;  Location: [facility];  Service: Oral Surgery;  Laterality: Bilateral;   TUBAL LIGATION     UPPER GI ENDOSCOPY N/A 02/15/2017   Procedure: UPPER GI ENDOSCOPY;  Surgeon: Clovis Riley, MD;  Location: [facility];  Service: General;  Laterality: N/A;     OB History   No obstetric history on file.     Family History  Problem Relation Age of Onset   Diabetes Mother    Hypertension Mother    Diabetes Father    Hypertension Father     Social History   Tobacco Use   Smoking status: Every Day    Packs/day: 1.00    Years: 29.00    Pack years: 29.00    Types: Cigarettes   Smokeless tobacco: Never  Vaping Use   Vaping Use: Never used  Substance Use Topics   Alcohol use: No   Drug use: Yes    Types: IV, Cocaine    Home Medications Prior to Admission medications   Medication Sig Start Date End Date Taking? Authorizing Provider  acetaminophen (TYLENOL) 500 MG tablet Take 1 tablet (500 mg total) by mouth every 6 (six) hours as needed for moderate pain. 01/31/18   Diona Browner, DMD  famotidine (PEPCID) 20 MG tablet Take 20 mg by mouth 2 (two) times daily as needed for heartburn or indigestion.    [provider]    Allergies     Ketorolac tromethamine and Methocarbamol  Review of Systems   Review of Systems  All other systems reviewed and are negative.  Physical Exam Updated Vital Signs BP (!) 192/129 (BP Location: Right Arm)   Pulse 76   Temp 98.4 F (36.9 C) (Oral)   Resp 16   SpO2 100%   Physical Exam Vitals and nursing note reviewed.  Constitutional:      General: She is not in acute distress.    Appearance: She is cachectic. She is not toxic-appearing.  HENT:     Head: Normocephalic and atraumatic.  Eyes:     General: Lids are normal.     Conjunctiva/sclera: Conjunctivae normal.     Pupils: Pupils are equal, round, and reactive to light.  Neck:     Thyroid: No thyroid mass.  Trachea: No tracheal deviation.  Cardiovascular:     Rate and Rhythm: Normal rate and regular rhythm.     Heart sounds: Normal heart sounds. No murmur heard.   No gallop.  Pulmonary:     Effort: Pulmonary effort is normal. No respiratory distress.     Breath sounds: Normal breath sounds. No stridor. No decreased breath sounds, wheezing, rhonchi or rales.  Abdominal:     General: There is no distension.     Palpations: Abdomen is soft.     Tenderness: There is abdominal tenderness in the left lower quadrant. There is no rebound.  Musculoskeletal:        General: No tenderness. Normal range of motion.     Cervical back: Normal range of motion and neck supple.  Skin:    General: Skin is warm and dry.     Findings: No abrasion or rash.  Neurological:     Mental Status: She is alert and oriented to person, place, and time. Mental status is at baseline.     GCS: GCS eye subscore is 4. GCS verbal subscore is 5. GCS motor subscore is 6.     Cranial Nerves: No cranial nerve deficit.     Sensory: No sensory deficit.     Motor: Motor function is intact.  Psychiatric:        Attention and Perception: Attention normal.        Speech: Speech normal.        Behavior: Behavior normal.   ED Results / Procedures /  Treatments   Labs (all labs ordered are listed, but only abnormal results are displayed) Labs Reviewed  LIPASE, BLOOD  COMPREHENSIVE METABOLIC PANEL  URINALYSIS, ROUTINE W REFLEX MICROSCOPIC  CBC WITH DIFFERENTIAL/PLATELET  I-STAT BETA HCG BLOOD, ED ([facility], [facility], [facility] ONLY)    EKG None  Radiology No results found.  Procedures Procedures   Medications Ordered in ED Medications  sodium chloride 0.9 % bolus 1,000 mL (has no administration in time range)  0.9 %  sodium chloride infusion (has no administration in time range)  metoCLOPramide (REGLAN) injection 5 mg (has no administration in time range)  morphine 4 MG/ML injection 6 mg (has no administration in time range)    ED Course  I have reviewed the triage vital signs and the nursing notes.  Pertinent labs & imaging results that were available during my care of the patient were reviewed by me and considered in my medical decision making (see chart for details).    MDM Rules/Calculators/A&P                           Urine likely contaminated but urine culture sent.  Mild hypokalemia noted and patient given oral potassium.  Has had trichomonas in her urine and will treat for presumptive STI with Rocephin, Doxy will also add Flagyl.  Patient medicated and feels better.  Will discharge home Final Clinical Impression(s) / ED Diagnoses Final diagnoses:  None    Rx / DC Orders ED Discharge Orders     None        Lacretia Leigh, MD 03/12/21 2259

## 2021-03-12 NOTE — ED Notes (Signed)
Unable to collect labs at this time. PT request to go to lobby with friend.

## 2021-03-12 NOTE — ED Notes (Signed)
X-ray at bedside

## 2021-03-12 NOTE — ED Notes (Signed)
Patient transported to CT 

## 2021-03-12 NOTE — ED Triage Notes (Addendum)
Pt reports ULQ abdominal pain for a few days. Also reports n/v, dark "smelly" urine, and white milky discharge from vagina. Hx of stomach ulcer and recent kidney infection. PT WAS ADMITTED ON 10/10 FOR SAME BUT LEFT AMA.

## 2021-03-13 ENCOUNTER — Encounter (HOSPITAL_COMMUNITY): Payer: Self-pay | Admitting: Emergency Medicine

## 2021-03-13 ENCOUNTER — Emergency Department (HOSPITAL_COMMUNITY)
Admission: EM | Admit: 2021-03-13 | Discharge: 2021-03-14 | Disposition: A | Discharge status: Home / Self Care | Payer: Medicaid Other | Attending: Emergency Medicine | Admitting: Emergency Medicine

## 2021-03-13 DIAGNOSIS — R109 Unspecified abdominal pain: Secondary | ICD-10-CM | POA: Diagnosis present

## 2021-03-13 DIAGNOSIS — R112 Nausea with vomiting, unspecified: Secondary | ICD-10-CM | POA: Insufficient documentation

## 2021-03-13 DIAGNOSIS — K625 Hemorrhage of anus and rectum: Secondary | ICD-10-CM | POA: Diagnosis not present

## 2021-03-13 DIAGNOSIS — F17210 Nicotine dependence, cigarettes, uncomplicated: Secondary | ICD-10-CM | POA: Diagnosis not present

## 2021-03-13 DIAGNOSIS — R1084 Generalized abdominal pain: Secondary | ICD-10-CM | POA: Insufficient documentation

## 2021-03-13 DIAGNOSIS — I10 Essential (primary) hypertension: Secondary | ICD-10-CM | POA: Diagnosis not present

## 2021-03-13 NOTE — ED Triage Notes (Signed)
Pt c/o rectal bleeding, hematemesis, and abdominal pain for a few days. Pt was seen last night for same and discharged. Hx of ulcers.

## 2021-03-14 ENCOUNTER — Other Ambulatory Visit: Payer: Self-pay

## 2021-03-14 LAB — CBC WITH DIFFERENTIAL/PLATELET
Abs Immature Granulocytes: 0.03 10*3/uL (ref 0.00–0.07)
Basophils Absolute: 0 10*3/uL (ref 0.0–0.1)
Basophils Relative: 0 %
Eosinophils Absolute: 0 10*3/uL (ref 0.0–0.5)
Eosinophils Relative: 0 %
HCT: 28.7 % — ABNORMAL LOW (ref 36.0–46.0)
Hemoglobin: 9.3 g/dL — ABNORMAL LOW (ref 12.0–15.0)
Immature Granulocytes: 0 %
Lymphocytes Relative: 16 %
Lymphs Abs: 1.7 10*3/uL (ref 0.7–4.0)
MCH: 21.6 pg — ABNORMAL LOW (ref 26.0–34.0)
MCHC: 32.4 g/dL (ref 30.0–36.0)
MCV: 66.7 fL — ABNORMAL LOW (ref 80.0–100.0)
Monocytes Absolute: 0.4 10*3/uL (ref 0.1–1.0)
Monocytes Relative: 4 %
Neutro Abs: 8.3 10*3/uL — ABNORMAL HIGH (ref 1.7–7.7)
Neutrophils Relative %: 80 %
Platelets: 434 10*3/uL — ABNORMAL HIGH (ref 150–400)
RBC: 4.3 MIL/uL (ref 3.87–5.11)
RDW: 20.1 % — ABNORMAL HIGH (ref 11.5–15.5)
WBC: 10.4 10*3/uL (ref 4.0–10.5)
nRBC: 0 % (ref 0.0–0.2)

## 2021-03-14 LAB — URINE CULTURE: Culture: <10000 — AB

## 2021-03-14 LAB — URINALYSIS, ROUTINE W REFLEX MICROSCOPIC
Bilirubin Urine: NEGATIVE
Glucose, UA: NEGATIVE mg/dL
Hgb urine dipstick: NEGATIVE
Ketones, ur: NEGATIVE mg/dL
Nitrite: NEGATIVE
Protein, ur: 30 mg/dL — AB
Specific Gravity, Urine: 1.025 (ref 1.005–1.030)
pH: 5 (ref 5.0–8.0)

## 2021-03-14 LAB — COMPREHENSIVE METABOLIC PANEL
ALT: 9 U/L (ref 0–44)
AST: 12 U/L — ABNORMAL LOW (ref 15–41)
Albumin: 3 g/dL — ABNORMAL LOW (ref 3.5–5.0)
Alkaline Phosphatase: 82 U/L (ref 38–126)
Anion gap: 7 (ref 5–15)
BUN: 17 mg/dL (ref 6–20)
CO2: 28 mmol/L (ref 22–32)
Calcium: 8.9 mg/dL (ref 8.9–10.3)
Chloride: 98 mmol/L (ref 98–111)
Creatinine, Ser: 0.72 mg/dL (ref 0.44–1.00)
GFR, Estimated: >60 mL/min (ref >60)
Glucose, Bld: 94 mg/dL (ref 70–99)
Potassium: 3.7 mmol/L (ref 3.5–5.1)
Sodium: 133 mmol/L — ABNORMAL LOW (ref 135–145)
Total Bilirubin: 0.4 mg/dL (ref 0.3–1.2)
Total Protein: 6.6 g/dL (ref 6.5–8.1)

## 2021-03-14 LAB — LIPASE, BLOOD: Lipase: 34 U/L (ref 11–51)

## 2021-03-14 LAB — POC OCCULT BLOOD, ED: Fecal Occult Bld: NEGATIVE

## 2021-03-14 MED ORDER — SUCRALFATE 1 GM/10ML PO SUSP: 1.0000 g | Freq: Three times a day (TID) | ORAL | 0 refills | Status: DC

## 2021-03-14 MED ORDER — HYDROMORPHONE HCL 1 MG/ML IJ SOLN
1.0000 mg | Freq: Once | INTRAMUSCULAR | Status: AC
Start: 2021-03-14 — End: 2021-03-14
  Administered 2021-03-14: 1 mg via INTRAVENOUS
  Filled 2021-03-14: qty 1

## 2021-03-14 MED ORDER — SODIUM CHLORIDE 0.9 % IV BOLUS
1000.0000 mL | Freq: Once | INTRAVENOUS | Status: AC
  Administered 2021-03-14: 1000 mL via INTRAVENOUS

## 2021-03-14 MED ORDER — ONDANSETRON HCL 4 MG/2ML IJ SOLN
4.0000 mg | Freq: Once | INTRAMUSCULAR | Status: AC
  Administered 2021-03-14: 4 mg via INTRAVENOUS
  Filled 2021-03-14: qty 2

## 2021-03-14 MED ORDER — PANTOPRAZOLE SODIUM 40 MG PO TBEC: 40.0000 mg | DELAYED_RELEASE_TABLET | Freq: Every day | ORAL | 3 refills | Status: DC

## 2021-03-14 MED ORDER — FAMOTIDINE IN NACL 20-0.9 MG/50ML-% IV SOLN
20.0000 mg | Freq: Once | INTRAVENOUS | Status: AC
  Administered 2021-03-14: 20 mg via INTRAVENOUS
  Filled 2021-03-14: qty 50

## 2021-03-14 NOTE — ED Provider Notes (Signed)
[facility] DEPT Provider Note   CSN: [id number] Arrival date & time: 03/13/21  2327     History Chief Complaint  Patient presents with   Abdominal Pain   Emesis   Rectal Bleeding    Terri [name] is a 45 y.o. female.  Patient presents to the emergency department for evaluation of abdominal pain, nausea and vomiting.  Patient reports that she has continued to vomit since she left the hospital yesterday.  Patient indicates that she has seen some blood in her vomit.  She also thinks there may have been some blood on the tissue when she wipes after a bowel movement.  Patient reports that she has been diagnosed with a bleeding ulcer in the past, thinks her ulcer has reoccurred.      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   Persistent fever 02/09/2019   COVID-19 virus not detected    Chest pain    Bacteremia due to Escherichia coli     Esophagitis    Hypokalemia    Hypomagnesemia    Sepsis ([place]) 02/02/2019   Leucocytosis 02/02/2019   Thrombocytosis 02/02/2019   UTI (urinary tract infection) 02/02/2019   GIB (gastrointestinal bleeding) 09/13/2017   Apnea 09/13/2017   Essential hypertension    Cellulitis and abscess of neck 08/12/2017   Cellulitis of submandibular region 08/10/2017   Odontogenic infection of jaw 08/10/2017   Cholecystitis 02/14/2017   Opioid abuse with opioid-induced mood disorder ([place]) 01/25/2017   Anxiety 02/11/2014   Depression 07/12/2012    Past Surgical History:  Procedure Laterality Date   ALVEOLOPLASTY Bilateral 01/31/2018   Procedure: ALVEOLOPLASTY;  Surgeon: Diona Browner, DDS;  Location: [facility];  Service: Oral Surgery;  Laterality: Bilateral;   ANKLE SURGERY Left 1992   with hardware   BACK SURGERY     BIOPSY  02/08/2019   Procedure: BIOPSY;  Surgeon: Rush Landmark Telford Nab., MD;  Location: [facility];  Service: Gastroenterology;;   CESAREAN SECTION     CHOLECYSTECTOMY N/A 02/15/2017   Procedure: LAPAROSCOPIC CHOLECYSTECTOMY;  Surgeon: Clovis Riley, MD;  Location: [facility];  Service: General;  Laterality: N/A;   ENTEROSCOPY N/A 02/08/2019   Procedure: ENTEROSCOPY;  Surgeon: Rush Landmark Telford Nab., MD;  Location: [facility];  Service: Gastroenterology;  Laterality: N/A;   ESOPHAGOGASTRODUODENOSCOPY N/A 01/17/2021  Procedure: ESOPHAGOGASTRODUODENOSCOPY (EGD);  Surgeon: Juanita Craver, MD;  Location: [facility];  Service: Endoscopy;  Laterality: N/A;   ESOPHAGOGASTRODUODENOSCOPY (EGD) WITH PROPOFOL N/A 09/15/2017   Procedure: ESOPHAGOGASTRODUODENOSCOPY (EGD) WITH PROPOFOL;  Surgeon: Clarene Essex, MD;  Location: [facility];  Service: Endoscopy;  Laterality: N/A;   ESOPHAGOGASTRODUODENOSCOPY (EGD) WITH PROPOFOL N/A 02/08/2019   Procedure: ESOPHAGOGASTRODUODENOSCOPY (EGD) WITH PROPOFOL;  Surgeon: Rush Landmark Telford Nab., MD;  Location: [facility];  Service: Gastroenterology;  Laterality:  N/A;   GASTRIC BYPASS     INCISION AND DRAINAGE OF PERITONSILLAR ABCESS Left 08/13/2017   Procedure: INCISION AND DRAINAGE OF LEFT NECK ABSCESS;  Surgeon: Melida Quitter, MD;  Location: [facility];  Service: ENT;  Laterality: Left;   LAPAROSCOPIC LYSIS OF ADHESIONS  02/15/2017   Procedure: LAPAROSCOPIC LYSIS OF ADHESIONS;  Surgeon: Clovis Riley, MD;  Location: [facility];  Service: General;;   LUMBAR DISC SURGERY     LUMBAR FUSION     SAVORY DILATION N/A 02/08/2019   Procedure: SAVORY DILATION;  Surgeon: Irving Copas., MD;  Location: [facility];  Service: Gastroenterology;  Laterality: N/A;   TOOTH EXTRACTION Bilateral 01/31/2018   Procedure: DENTAL RESTORATION/EXTRACTIONS;  Surgeon: Diona Browner, DDS;  Location: [facility];  Service: Oral Surgery;  Laterality: Bilateral;   TUBAL LIGATION     UPPER GI ENDOSCOPY N/A 02/15/2017   Procedure: UPPER GI ENDOSCOPY;  Surgeon: Clovis Riley, MD;  Location: [facility];  Service: General;  Laterality: N/A;     OB History   No obstetric history on file.     Family History  Problem Relation Age of Onset   Diabetes Mother    Hypertension Mother    Diabetes Father    Hypertension Father     Social History   Tobacco Use   Smoking status: Every Day    Packs/day: 1.00    Years: 29.00    Pack years: 29.00    Types: Cigarettes   Smokeless tobacco: Never  Vaping Use   Vaping Use: Never used  Substance Use Topics   Alcohol use: No   Drug use: Yes    Types: IV, Cocaine    Home Medications Prior to Admission medications   Medication Sig Start Date End Date Taking? Authorizing Provider  acetaminophen (TYLENOL) 500 MG tablet Take 1 tablet (500 mg total) by mouth every 6 (six) hours as needed for moderate pain. Patient not taking: Reported on 03/14/2021 01/31/18   Diona Browner, DMD  doxycycline (VIBRAMYCIN) 100 MG capsule Take 1 capsule (100 mg total) by mouth 2 (two) times daily. Patient not taking: Reported on 03/14/2021 03/12/21   Lacretia Leigh, MD  metroNIDAZOLE (FLAGYL) 500 MG tablet Take 1 tablet (500 mg total) by mouth 2 (two) times daily. Patient not taking: Reported on 03/14/2021 03/12/21   Lacretia Leigh, MD    Allergies    Ketorolac tromethamine and Methocarbamol  Review of Systems   Review of Systems  Gastrointestinal:  Positive for abdominal pain, blood in stool, nausea and vomiting.  All other systems reviewed and are negative.  Physical Exam Updated Vital Signs BP (!) 171/114   Pulse (!) 108   Temp 98.4 F (36.9 C)   Resp 18   Ht 5\' 9"  (1.753 m)   Wt 59.9 kg   SpO2 100%   BMI 19.49 kg/m   Physical Exam Vitals and nursing note reviewed.  Constitutional:      General: She is not in acute distress.    Appearance: Normal appearance. She is  well-developed.  HENT:     Head: Normocephalic and atraumatic.     Right Ear: Hearing normal.     Left Ear: Hearing normal.     Nose: Nose normal.  Eyes:     Conjunctiva/sclera: Conjunctivae normal.     Pupils: Pupils are equal, round, and reactive to light.  Cardiovascular:     Rate and Rhythm: Regular rhythm.     Heart sounds: S1 normal and S2 normal. No murmur heard.   No friction rub. No gallop.  Pulmonary:     Effort: Pulmonary effort is normal. No respiratory distress.     Breath sounds: Normal breath sounds.  Chest:     Chest wall: No tenderness.  Abdominal:     General: Bowel sounds are normal.     Palpations: Abdomen is soft.     Tenderness: There is generalized abdominal tenderness. There is no guarding or rebound. Negative signs include Murphy's sign and McBurney's sign.     Hernia: No hernia is present.  Musculoskeletal:        General: Normal range of motion.     Cervical back: Normal range of motion and neck supple.  Skin:    General: Skin is warm and dry.     Findings: No rash.  Neurological:     Mental Status: She is alert and oriented to person, place, and time.     GCS: GCS eye subscore is 4. GCS verbal subscore is 5. GCS motor  subscore is 6.     Cranial Nerves: No cranial nerve deficit.     Sensory: No sensory deficit.     Coordination: Coordination normal.  Psychiatric:        Speech: Speech normal.        Behavior: Behavior normal.        Thought Content: Thought content normal.    ED Results / Procedures / Treatments   Labs (all labs ordered are listed, but only abnormal results are displayed) Labs Reviewed  CBC WITH DIFFERENTIAL/PLATELET - Abnormal; Notable for the following components:      Result Value   Hemoglobin 9.3 (*)    HCT 28.7 (*)    MCV 66.7 (*)    MCH 21.6 (*)    RDW 20.1 (*)    Platelets 434 (*)    Neutro Abs 8.3 (*)    All other components within normal limits  COMPREHENSIVE METABOLIC PANEL - Abnormal; Notable for the following components:   Sodium 133 (*)    Albumin 3.0 (*)    AST 12 (*)    All other components within normal limits  LIPASE, BLOOD  URINALYSIS, ROUTINE W REFLEX MICROSCOPIC  POC OCCULT BLOOD, ED    EKG None  Radiology DG Chest Port 1 View  Result Date: 03/12/2021 CLINICAL DATA:  Chest pain EXAM: PORTABLE CHEST 1 VIEW COMPARISON:  02/26/2021 FINDINGS: The heart size and mediastinal contours are within normal limits. Both lungs are clear. The visualized skeletal structures are unremarkable. IMPRESSION: Negative. Electronically Signed   By: Rolm Baptise M.D.   On: 03/12/2021 22:09   CT Renal Stone Study  Result Date: 03/12/2021 CLINICAL DATA:  Flank pain, kidney stone suspected left flank pain. EXAM: CT ABDOMEN AND PELVIS WITHOUT CONTRAST TECHNIQUE: Multidetector CT imaging of the abdomen and pelvis was performed following the standard protocol without IV contrast. COMPARISON:  02/26/2021 FINDINGS: Lower chest: No acute abnormality. Hepatobiliary: Prior cholecystectomy.  No focal hepatic abnormality. Pancreas: No focal abnormality or ductal dilatation. Spleen: No focal  abnormality.  Normal size. Adrenals/Urinary Tract: No adrenal abnormality. No focal renal  abnormality. No stones or hydronephrosis. Urinary bladder is unremarkable. 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 Moderate stool throughout the colon. No evidence of bowel obstruction. Vascular/Lymphatic: Heavily calcified aorta and iliac vessels. No evidence of aneurysm or adenopathy. Reproductive: Uterus and adnexa unremarkable.  No mass. Other: No free fluid or free air. Musculoskeletal: No acute bony abnormality. Degenerative changes in the lumbar spine. IMPRESSION: No renal or ureteral stones.  No hydronephrosis. Heavily calcified aorta and iliac vessels. Moderate stool burden in the colon No acute findings in the abdomen or pelvis. Electronically Signed   By: Rolm Baptise M.D.   On: 03/12/2021 22:43    Procedures Procedures   Medications Ordered in ED Medications  sodium chloride 0.9 % bolus 1,000 mL (0 mLs Intravenous Stopped 03/14/21 0239)  ondansetron (ZOFRAN) injection 4 mg (4 mg Intravenous Given 03/14/21 0135)  famotidine (PEPCID) IVPB 20 mg premix (0 mg Intravenous Stopped 03/14/21 0239)    ED Course  I have reviewed the triage vital signs and the nursing notes.  Pertinent labs & imaging results that were available during my care of the patient were reviewed by me and considered in my medical decision making (see chart for details).    MDM Rules/Calculators/A&P                           Patient presents to the emergency department for evaluation of abdominal pain.  Patient has a history of chronic abdominal pain.  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  Patient reports that she thinks that her ulcer might be bleeding because she vomited something that she thought might be blood.  She did vomit one time here in the department and it pink, look like wine or fruit punch.  Rectal exam was heme-negative.  Doubt any significant bleeding from her ulcer.  Blood work was performed and no significant abnormality.  Hemoglobin is slightly down from prior but  essentially at her baseline.  I do not see a reason for admission at this time, has not had any hematemesis here in the department.  She will be discharged, return precautions.  Otherwise follow-up with her gastroenterologist.  Final Clinical Impression(s) / ED Diagnoses Final diagnoses:  Generalized abdominal pain    Rx / DC Orders ED Discharge Orders     None        Jacarri Gesner, Gwenyth Allegra, MD 03/14/21 [phone]

## 2021-03-14 NOTE — ED Notes (Signed)
Patient has no concerns after AVS has been reviewed and patient education provided. Patient discharged. 

## 2021-03-14 NOTE — Discharge Instructions (Signed)
Please follow-up with your GI doctor as soon as possible.  If your symptoms worsen or you vomit any blood, return to this ER.

## 2021-03-15 ENCOUNTER — Emergency Department (HOSPITAL_COMMUNITY): Payer: Medicaid Other

## 2021-03-15 ENCOUNTER — Encounter (HOSPITAL_COMMUNITY): Payer: Self-pay

## 2021-03-15 ENCOUNTER — Other Ambulatory Visit: Payer: Self-pay

## 2021-03-15 ENCOUNTER — Inpatient Hospital Stay (HOSPITAL_COMMUNITY)
Admission: EM | Admit: 2021-03-15 | Discharge: 2021-03-18 | DRG: 378 | Disposition: A | Discharge status: Home / Self Care | Payer: Medicaid Other | Attending: Family Medicine | Admitting: Family Medicine

## 2021-03-15 DIAGNOSIS — K922 Gastrointestinal hemorrhage, unspecified: Secondary | ICD-10-CM | POA: Diagnosis present

## 2021-03-15 DIAGNOSIS — A64 Unspecified sexually transmitted disease: Secondary | ICD-10-CM

## 2021-03-15 DIAGNOSIS — Z8249 Family history of ischemic heart disease and other diseases of the circulatory system: Secondary | ICD-10-CM

## 2021-03-15 DIAGNOSIS — K2971 Gastritis, unspecified, with bleeding: Secondary | ICD-10-CM

## 2021-03-15 DIAGNOSIS — Z20822 Contact with and (suspected) exposure to covid-19: Secondary | ICD-10-CM | POA: Diagnosis present

## 2021-03-15 DIAGNOSIS — F1910 Other psychoactive substance abuse, uncomplicated: Secondary | ICD-10-CM | POA: Diagnosis present

## 2021-03-15 DIAGNOSIS — K289 Gastrojejunal ulcer, unspecified as acute or chronic, without hemorrhage or perforation: Secondary | ICD-10-CM | POA: Diagnosis present

## 2021-03-15 DIAGNOSIS — Z79899 Other long term (current) drug therapy: Secondary | ICD-10-CM

## 2021-03-15 DIAGNOSIS — Z833 Family history of diabetes mellitus: Secondary | ICD-10-CM

## 2021-03-15 DIAGNOSIS — I160 Hypertensive urgency: Secondary | ICD-10-CM | POA: Diagnosis present

## 2021-03-15 DIAGNOSIS — N739 Female pelvic inflammatory disease, unspecified: Secondary | ICD-10-CM

## 2021-03-15 DIAGNOSIS — N12 Tubulo-interstitial nephritis, not specified as acute or chronic: Secondary | ICD-10-CM

## 2021-03-15 DIAGNOSIS — Z23 Encounter for immunization: Secondary | ICD-10-CM

## 2021-03-15 DIAGNOSIS — R109 Unspecified abdominal pain: Secondary | ICD-10-CM

## 2021-03-15 DIAGNOSIS — E871 Hypo-osmolality and hyponatremia: Secondary | ICD-10-CM | POA: Diagnosis present

## 2021-03-15 DIAGNOSIS — D500 Iron deficiency anemia secondary to blood loss (chronic): Secondary | ICD-10-CM | POA: Diagnosis present

## 2021-03-15 DIAGNOSIS — I10 Essential (primary) hypertension: Secondary | ICD-10-CM | POA: Diagnosis present

## 2021-03-15 DIAGNOSIS — Z888 Allergy status to other drugs, medicaments and biological substances status: Secondary | ICD-10-CM

## 2021-03-15 DIAGNOSIS — N730 Acute parametritis and pelvic cellulitis: Secondary | ICD-10-CM | POA: Diagnosis present

## 2021-03-15 DIAGNOSIS — Z981 Arthrodesis status: Secondary | ICD-10-CM

## 2021-03-15 DIAGNOSIS — R112 Nausea with vomiting, unspecified: Secondary | ICD-10-CM

## 2021-03-15 DIAGNOSIS — D509 Iron deficiency anemia, unspecified: Secondary | ICD-10-CM | POA: Diagnosis present

## 2021-03-15 DIAGNOSIS — F17210 Nicotine dependence, cigarettes, uncomplicated: Secondary | ICD-10-CM | POA: Diagnosis present

## 2021-03-15 DIAGNOSIS — Z9884 Bariatric surgery status: Secondary | ICD-10-CM

## 2021-03-15 DIAGNOSIS — R030 Elevated blood-pressure reading, without diagnosis of hypertension: Secondary | ICD-10-CM

## 2021-03-15 DIAGNOSIS — K284 Chronic or unspecified gastrojejunal ulcer with hemorrhage: Principal | ICD-10-CM | POA: Diagnosis present

## 2021-03-15 LAB — COMPREHENSIVE METABOLIC PANEL
ALT: 10 U/L (ref 0–44)
AST: 14 U/L — ABNORMAL LOW (ref 15–41)
Albumin: 3.3 g/dL — ABNORMAL LOW (ref 3.5–5.0)
Alkaline Phosphatase: 81 U/L (ref 38–126)
Anion gap: 7 (ref 5–15)
BUN: 12 mg/dL (ref 6–20)
CO2: 28 mmol/L (ref 22–32)
Calcium: 9.1 mg/dL (ref 8.9–10.3)
Chloride: 98 mmol/L (ref 98–111)
Creatinine, Ser: 0.71 mg/dL (ref 0.44–1.00)
GFR, Estimated: >60 mL/min (ref >60)
Glucose, Bld: 93 mg/dL (ref 70–99)
Potassium: 4 mmol/L (ref 3.5–5.1)
Sodium: 133 mmol/L — ABNORMAL LOW (ref 135–145)
Total Bilirubin: 0.3 mg/dL (ref 0.3–1.2)
Total Protein: 7.2 g/dL (ref 6.5–8.1)

## 2021-03-15 LAB — URINALYSIS, ROUTINE W REFLEX MICROSCOPIC
Bacteria, UA: NONE SEEN
Bilirubin Urine: NEGATIVE
Glucose, UA: NEGATIVE mg/dL
Ketones, ur: NEGATIVE mg/dL
Nitrite: NEGATIVE
Protein, ur: NEGATIVE mg/dL
Specific Gravity, Urine: 1.025 (ref 1.005–1.030)
pH: 7 (ref 5.0–8.0)

## 2021-03-15 LAB — CBC
HCT: 30.5 % — ABNORMAL LOW (ref 36.0–46.0)
Hemoglobin: 9.8 g/dL — ABNORMAL LOW (ref 12.0–15.0)
MCH: 21.7 pg — ABNORMAL LOW (ref 26.0–34.0)
MCHC: 32.1 g/dL (ref 30.0–36.0)
MCV: 67.5 fL — ABNORMAL LOW (ref 80.0–100.0)
Platelets: 451 10*3/uL — ABNORMAL HIGH (ref 150–400)
RBC: 4.52 MIL/uL (ref 3.87–5.11)
RDW: 20.3 % — ABNORMAL HIGH (ref 11.5–15.5)
WBC: 8.2 10*3/uL (ref 4.0–10.5)
nRBC: 0 % (ref 0.0–0.2)

## 2021-03-15 LAB — LACTIC ACID, PLASMA: Lactic Acid, Venous: 0.9 mmol/L (ref 0.5–1.9)

## 2021-03-15 LAB — I-STAT BETA HCG BLOOD, ED (MC, WL, AP ONLY): I-stat hCG, quantitative: <5 m[IU]/mL (ref <5)

## 2021-03-15 LAB — LIPASE, BLOOD: Lipase: 42 U/L (ref 11–51)

## 2021-03-15 MED ORDER — SODIUM CHLORIDE 0.9% FLUSH
3.0000 mL | Freq: Two times a day (BID) | INTRAVENOUS | Status: DC
  Administered 2021-03-16 – 2021-03-17 (×5): 3 mL via INTRAVENOUS

## 2021-03-15 MED ORDER — ONDANSETRON HCL 4 MG/2ML IJ SOLN
4.0000 mg | Freq: Once | INTRAMUSCULAR | Status: AC
  Administered 2021-03-15: 4 mg via INTRAVENOUS
  Filled 2021-03-15: qty 2

## 2021-03-15 MED ORDER — SODIUM CHLORIDE 0.9 % IV BOLUS
1000.0000 mL | Freq: Once | INTRAVENOUS | Status: AC
  Administered 2021-03-15: 1000 mL via INTRAVENOUS

## 2021-03-15 MED ORDER — METRONIDAZOLE 500 MG/100ML IV SOLN
500.0000 mg | Freq: Two times a day (BID) | INTRAVENOUS | Status: DC
  Administered 2021-03-16 (×2): 500 mg via INTRAVENOUS
  Filled 2021-03-15 (×2): qty 100

## 2021-03-15 MED ORDER — ENOXAPARIN SODIUM 40 MG/0.4ML IJ SOSY
40.0000 mg | PREFILLED_SYRINGE | INTRAMUSCULAR | Status: DC
  Administered 2021-03-16 – 2021-03-17 (×2): 40 mg via SUBCUTANEOUS
  Filled 2021-03-15 (×3): qty 0.4

## 2021-03-15 MED ORDER — SODIUM CHLORIDE 0.9 % IV SOLN
100.0000 mg | Freq: Once | INTRAVENOUS | Status: AC
  Administered 2021-03-15: 100 mg via INTRAVENOUS
  Filled 2021-03-15: qty 100

## 2021-03-15 MED ORDER — SODIUM CHLORIDE 0.9 % IV SOLN
1.0000 g | Freq: Once | INTRAVENOUS | Status: AC
  Administered 2021-03-15: 1 g via INTRAVENOUS
  Filled 2021-03-15: qty 10

## 2021-03-15 MED ORDER — SODIUM CHLORIDE 0.9 % IV SOLN
1.0000 g | INTRAVENOUS | Status: DC
  Administered 2021-03-16: 1 g via INTRAVENOUS
  Filled 2021-03-15 (×2): qty 10

## 2021-03-15 MED ORDER — AMLODIPINE BESYLATE 5 MG PO TABS
5.0000 mg | ORAL_TABLET | Freq: Once | ORAL | Status: AC
  Administered 2021-03-15: 5 mg via ORAL
  Filled 2021-03-15: qty 1

## 2021-03-15 MED ORDER — HYDROCODONE-ACETAMINOPHEN 5-325 MG PO TABS
1.0000 | ORAL_TABLET | ORAL | Status: DC | PRN
  Administered 2021-03-16 – 2021-03-18 (×11): 2 via ORAL
  Filled 2021-03-15 (×12): qty 2

## 2021-03-15 MED ORDER — POLYETHYLENE GLYCOL 3350 17 G PO PACK: 17.0000 g | PACK | Freq: Every day | ORAL | Status: DC | PRN

## 2021-03-15 MED ORDER — PANTOPRAZOLE SODIUM 40 MG PO TBEC
40.0000 mg | DELAYED_RELEASE_TABLET | Freq: Every day | ORAL | Status: DC
  Administered 2021-03-16 – 2021-03-18 (×3): 40 mg via ORAL
  Filled 2021-03-15 (×3): qty 1

## 2021-03-15 MED ORDER — OXYCODONE-ACETAMINOPHEN 5-325 MG PO TABS
1.0000 | ORAL_TABLET | Freq: Once | ORAL | Status: AC
  Administered 2021-03-15: 1 via ORAL
  Filled 2021-03-15: qty 1

## 2021-03-15 MED ORDER — SODIUM CHLORIDE 0.9 % IV SOLN
100.0000 mg | Freq: Two times a day (BID) | INTRAVENOUS | Status: DC
  Filled 2021-03-15: qty 100

## 2021-03-15 MED ORDER — ACETAMINOPHEN 650 MG RE SUPP: 650.0000 mg | Freq: Four times a day (QID) | RECTAL | Status: DC | PRN

## 2021-03-15 MED ORDER — ACETAMINOPHEN 325 MG PO TABS
650.0000 mg | ORAL_TABLET | Freq: Four times a day (QID) | ORAL | Status: DC | PRN
  Administered 2021-03-16: 650 mg via ORAL
  Filled 2021-03-15: qty 2

## 2021-03-15 MED ORDER — IOHEXOL 350 MG/ML SOLN
80.0000 mL | Freq: Once | INTRAVENOUS | Status: AC | PRN
  Administered 2021-03-15: 80 mL via INTRAVENOUS

## 2021-03-15 MED ORDER — METRONIDAZOLE 500 MG/100ML IV SOLN
500.0000 mg | Freq: Once | INTRAVENOUS | Status: AC
  Administered 2021-03-15: 500 mg via INTRAVENOUS
  Filled 2021-03-15: qty 100

## 2021-03-15 MED ORDER — MORPHINE SULFATE (PF) 4 MG/ML IV SOLN
4.0000 mg | Freq: Once | INTRAVENOUS | Status: AC
  Administered 2021-03-15: 4 mg via INTRAVENOUS
  Filled 2021-03-15: qty 1

## 2021-03-15 MED ORDER — HYDROMORPHONE HCL 1 MG/ML IJ SOLN
1.0000 mg | INTRAMUSCULAR | Status: DC | PRN
Start: 2021-03-15 — End: 2021-03-16
  Administered 2021-03-16 (×5): 1 mg via INTRAVENOUS
  Filled 2021-03-15 (×6): qty 1

## 2021-03-15 MED ORDER — AMLODIPINE BESYLATE 5 MG PO TABS
5.0000 mg | ORAL_TABLET | Freq: Every day | ORAL | Status: DC
  Administered 2021-03-16: 5 mg via ORAL
  Filled 2021-03-15: qty 1

## 2021-03-15 NOTE — ED Provider Notes (Signed)
[facility] DEPT Provider Note   CSN: [id number] Arrival date & time: 03/15/21  1505     History Chief Complaint  Patient presents with   Abdominal Pain   Emesis   Vaginal Pain    Terri [name] is a 45 y.o. female.  This is a 45 y.o. female with significant medical history as below, including HTN who presents to the ED with complaint of abdominal pain.  Per chart review patient was recently being treated for trichomonas, possibly PID.  Reports he has been unable to fill the prescription secondary to cost issues.  She has been having worsening abdominal discomfort over the past 48 hours.  Worsening vaginal discharge, dysuria, vaginal discomfort.  Nausea and vomiting.  Has any difficulty tolerating oral intake.  Chills without fevers.  Chest pain or dyspnea.  No recent travel or sick contacts.  No change in bowel function.    Patient was hospitalized earlier in the month for pyelonephritis left AMA.  She is concerned that her symptoms have reoccurred.  This is her third emergency department visit in the past 3 days for the symptoms  Location: Suprapubic, pelvic Duration: 2 days Onset: Gradual Timing: Constant Description: Sharp, stabbing Severity: Moderate Exacerbating/Alleviating Factors: Worse with palpation, using the bathroom Associated Symptoms: Malodorous vaginal discharge Pertinent Negatives: No fevers, suspicious oral intake, recent travel or sick contacts    The history is provided by the patient. No language interpreter was used.  Abdominal Pain Associated symptoms: chills, nausea, vaginal discharge and vomiting   Associated symptoms: no chest pain, no cough, no fever, no hematuria and no shortness of breath   Emesis Associated symptoms: abdominal pain, arthralgias and chills   Associated symptoms: no cough, no fever and no headaches   Vaginal Pain Associated symptoms include abdominal pain. Pertinent negatives include no chest pain, no  headaches and no shortness of breath.      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   Persistent fever 02/09/2019   COVID-19 virus not detected    Chest pain    Bacteremia due to Escherichia coli    Esophagitis    Hypokalemia    Hypomagnesemia    Sepsis ([place]) 02/02/2019   Leucocytosis 02/02/2019   Thrombocytosis 02/02/2019   UTI (urinary tract infection) 02/02/2019   GIB (gastrointestinal bleeding)  09/13/2017   Apnea 09/13/2017   Essential hypertension    Cellulitis and abscess of neck 08/12/2017   Cellulitis of submandibular region 08/10/2017   Odontogenic infection of jaw 08/10/2017   Cholecystitis 02/14/2017   Opioid abuse with opioid-induced mood disorder ([place]) 01/25/2017   Anxiety 02/11/2014   Depression 07/12/2012    Past Surgical History:  Procedure Laterality Date   ALVEOLOPLASTY Bilateral 01/31/2018   Procedure: ALVEOLOPLASTY;  Surgeon: Diona Browner, DDS;   Location: [facility];  Service: Oral Surgery;  Laterality: Bilateral;   ANKLE SURGERY Left 1992   with hardware   BACK SURGERY     BIOPSY  02/08/2019   Procedure: BIOPSY;  Surgeon: Rush Landmark Telford Nab., MD;  Location: [facility];  Service: Gastroenterology;;   CESAREAN SECTION     CHOLECYSTECTOMY N/A 02/15/2017   Procedure: LAPAROSCOPIC CHOLECYSTECTOMY;  Surgeon: Clovis Riley, MD;  Location: [facility];  Service: General;  Laterality: N/A;   ENTEROSCOPY N/A 02/08/2019   Procedure: ENTEROSCOPY;  Surgeon: Rush Landmark Telford Nab., MD;  [facility];  Service: Gastroenterology;  Laterality: N/A;   ESOPHAGOGASTRODUODENOSCOPY N/A 01/17/2021   Procedure: ESOPHAGOGASTRODUODENOSCOPY (EGD);  Surgeon: Juanita Craver, MD;  Location: [facility];  Service: Endoscopy;  Laterality: N/A;   ESOPHAGOGASTRODUODENOSCOPY (EGD) WITH PROPOFOL N/A 09/15/2017   Procedure: ESOPHAGOGASTRODUODENOSCOPY (EGD) WITH PROPOFOL;  Surgeon: Clarene Essex, MD;  [facility];  Service: Endoscopy;  Laterality: N/A;   ESOPHAGOGASTRODUODENOSCOPY (EGD) WITH PROPOFOL N/A 02/08/2019   Procedure: ESOPHAGOGASTRODUODENOSCOPY (EGD) WITH PROPOFOL;  Surgeon: Rush Landmark Telford Nab., MD;  [facility];  Service: Gastroenterology;  Laterality: N/A;   GASTRIC BYPASS     INCISION AND DRAINAGE OF PERITONSILLAR ABCESS Left 08/13/2017   Procedure: INCISION AND DRAINAGE OF LEFT NECK ABSCESS;  Surgeon: Melida Quitter, MD;  Location[facility];  Service: ENT;  Laterality: Left;   LAPAROSCOPIC LYSIS OF ADHESIONS  02/15/2017   Procedure: LAPAROSCOPIC LYSIS OF ADHESIONS;  Surgeon: Clovis Riley, MD;  [facility];  Service: General;;   LUMBAR DISC SURGERY     LUMBAR FUSION     SAVORY DILATION N/A 02/08/2019   Procedure: SAVORY DILATION;  Surgeon: Irving Copas., MD;  [facility];  Service: Gastroenterology;  Laterality: N/A;   TOOTH EXTRACTION Bilateral 01/31/2018   Procedure: DENTAL RESTORATION/EXTRACTIONS;   Surgeon: Diona Browner, DDS;  Location: [facility];  Service: Oral Surgery;  Laterality: Bilateral;   TUBAL LIGATION     UPPER GI ENDOSCOPY N/A 02/15/2017   Procedure: UPPER GI ENDOSCOPY;  Surgeon: Clovis Riley, MD;  Location[facility];  Service: General;  Laterality: N/A;     OB History   No obstetric history on file.     Family History  Problem Relation Age of Onset   Diabetes Mother    Hypertension Mother    Diabetes Father    Hypertension Father     Social History   Tobacco Use   Smoking status: Every Day    Packs/day: 1.00    Years: 29.00    Pack years: 29.00    Types: Cigarettes   Smokeless tobacco: Never  Vaping Use   Vaping Use: Never used  Substance Use Topics   Alcohol use: No   Drug use: Yes    Types: IV, Cocaine    Home Medications Prior to Admission medications   Medication Sig Start Date End Date Taking? Authorizing Provider  acetaminophen (TYLENOL) 500 MG tablet Take 1 tablet (500 mg total) by mouth every 6 (six) hours as needed for moderate  pain. Patient not taking: Reported on 03/14/2021 01/31/18   Diona Browner, DMD  doxycycline (VIBRAMYCIN) 100 MG capsule Take 1 capsule (100 mg total) by mouth 2 (two) times daily. Patient not taking: Reported on 03/14/2021 03/12/21   Lacretia Leigh, MD  metroNIDAZOLE (FLAGYL) 500 MG tablet Take 1 tablet (500 mg total) by mouth 2 (two) times daily. Patient not taking: Reported on 03/14/2021 03/12/21   Lacretia Leigh, MD  pantoprazole (PROTONIX) 40 MG tablet Take 1 tablet (40 mg total) by mouth daily. 03/14/21   Orpah Greek, MD  sucralfate (CARAFATE) 1 GM/10ML suspension Take 10 mLs (1 g total) by mouth 4 (four) times daily -  with meals and at bedtime. 03/14/21   Orpah Greek, MD    Allergies    Ketorolac tromethamine and Methocarbamol  Review of Systems   Review of Systems  Constitutional:  Positive for appetite change and chills. Negative for fever.  HENT:  Negative for facial swelling and  trouble swallowing.   Eyes:  Negative for photophobia and visual disturbance.  Respiratory:  Negative for cough and shortness of breath.   Cardiovascular:  Negative for chest pain and palpitations.  Gastrointestinal:  Positive for abdominal pain, nausea and vomiting.  Endocrine: Negative for polydipsia and polyuria.  Genitourinary:  Positive for vaginal discharge and vaginal pain. Negative for difficulty urinating and hematuria.  Musculoskeletal:  Positive for arthralgias. Negative for gait problem and joint swelling.  Skin:  Negative for pallor and rash.  Neurological:  Negative for syncope and headaches.  Psychiatric/Behavioral:  Negative for agitation and confusion.    Physical Exam Updated Vital Signs BP (!) 214/125   Pulse 84   Temp 99.7 F (37.6 C) (Oral)   Resp 17   SpO2 100%   Physical Exam Vitals and nursing note reviewed. Exam conducted with a chaperone present.  Constitutional:      General: She is not in acute distress.    Appearance: Normal appearance. She is well-developed.  HENT:     Head: Normocephalic and atraumatic.     Right Ear: External ear normal.     Left Ear: External ear normal.     Nose: Nose normal.     Mouth/Throat:     Mouth: Mucous membranes are moist.  Eyes:     General: No scleral icterus.       Right eye: No discharge.        Left eye: No discharge.  Cardiovascular:     Rate and Rhythm: Normal rate and regular rhythm.     Pulses: Normal pulses.     Heart sounds: Normal heart sounds.  Pulmonary:     Effort: Pulmonary effort is normal. No respiratory distress.     Breath sounds: Normal breath sounds.  Abdominal:     General: Abdomen is flat. Bowel sounds are normal.     Palpations: Abdomen is soft.     Tenderness: There is generalized abdominal tenderness.  Genitourinary:    General: Normal vulva.     Comments: Patient refused internal pelvic exam, she is agreeable with external exam.  External vaginal tissue appears healthy and intact,  no rashes or lesions.  No evidence of cellulitis or bleeding, drainage. Musculoskeletal:        General: Normal range of motion.     Cervical back: Normal range of motion.     Right lower leg: No edema.     Left lower leg: No edema.  Skin:    General: Skin is warm and  dry.     Capillary Refill: Capillary refill takes less than 2 seconds.  Neurological:     Mental Status: She is alert.  Psychiatric:        Mood and Affect: Mood normal.        Behavior: Behavior normal.    ED Results / Procedures / Treatments   Labs (all labs ordered are listed, but only abnormal results are displayed) Labs Reviewed  COMPREHENSIVE METABOLIC PANEL - Abnormal; Notable for the following components:      Result Value   Sodium 133 (*)    Albumin 3.3 (*)    AST 14 (*)    All other components within normal limits  CBC - Abnormal; Notable for the following components:   Hemoglobin 9.8 (*)    HCT 30.5 (*)    MCV 67.5 (*)    MCH 21.7 (*)    RDW 20.3 (*)    Platelets 451 (*)    All other components within normal limits  URINALYSIS, ROUTINE W REFLEX MICROSCOPIC - Abnormal; Notable for the following components:   Color, Urine STRAW (*)    Hgb urine dipstick SMALL (*)    Leukocytes,Ua LARGE (*)    All other components within normal limits  URINE CULTURE  CULTURE, BLOOD (ROUTINE X 2)  CULTURE, BLOOD (ROUTINE X 2)  LIPASE, BLOOD  LACTIC ACID, PLASMA  I-STAT BETA HCG BLOOD, ED ([facility], [facility], [facility] ONLY)    EKG None  Radiology CT ABDOMEN PELVIS W CONTRAST  Result Date: 03/15/2021 CLINICAL DATA:  Nonlocalized acute abdominal pain. Abdomen pain lower cramping Pelvic inflammatory disease and STD per patient. EXAM: CT ABDOMEN AND PELVIS WITH CONTRAST TECHNIQUE: Multidetector CT imaging of the abdomen and pelvis was performed using the standard protocol following bolus administration of intravenous contrast. CONTRAST:  62mL OMNIPAQUE IOHEXOL 350 MG/ML SOLN COMPARISON:  CT abdomen pelvis 02/26/2021 FINDINGS: Lower  chest: Bibasilar linear atelectasis versus scarring. Hepatobiliary: Indeterminate oval 1.1 cm right hepatic lobe hypodensity (2:22). Status post cholecystectomy. No biliary dilatation. Pancreas: No focal lesion. Normal pancreatic contour. No surrounding inflammatory changes. No main pancreatic ductal dilatation. Spleen: Normal in size without focal abnormality. Adrenals/Urinary Tract: No adrenal nodule bilaterally. Bilateral kidneys enhance symmetrically. No hydronephrosis. No hydroureter. The urinary bladder is unremarkable. On delayed imaging, there is no urothelial wall thickening and there are no filling defects in the opacified portions of the bilateral collecting systems or ureters. Stomach/Bowel: Gastric Roux-en-Y surgical bypass surgical changes. Stomach is within normal limits. No evidence of bowel wall thickening or dilatation. Stool throughout the colon. The appendix not definitely identified. Vascular/Lymphatic: No abdominal aorta or iliac aneurysm. Severe atherosclerotic plaque of the aorta and its branches. No abdominal, pelvic, or inguinal lymphadenopathy. Reproductive: Markedly limited evaluation of the pelvis due to lack of intraperitoneal fat. Uterus and bilateral adnexa are unremarkable. Other: No intraperitoneal free fluid. No intraperitoneal free gas. No organized fluid collection. Musculoskeletal: Several tiny foci of gas along the subcutaneus soft tissues of the bilateral labia majora. No suspicious lytic or blastic osseous lesions. No acute displaced fracture. Multilevel degenerative changes of the spine. IMPRESSION: 1. Several tiny foci of gas along the subcutaneus soft tissues of the bilateral labia majora. Gas could be within folds external to the soft tissues but this is unlikely given the distribution on coronal imaging. No organized fluid collection. Necrotizing fasciitis cannot be excluded as this is a clinical diagnosis. Recommend correlation with physical exam. 2. No acute  intra-abdominal or intrapelvic abnormality with slightly limited evaluation due  to lack of intraperitoneal fat. Given history, please consider pelvic ultrasound if clinically indicated. 3. Stool throughout the colon. Electronically Signed   By: Iven Finn M.D.   On: 03/15/2021 18:00    Procedures Procedures   Medications Ordered in ED Medications  doxycycline (VIBRAMYCIN) 100 mg in sodium chloride 0.9 % 250 mL IVPB (100 mg Intravenous New Bag/Given 03/15/21 2236)  oxyCODONE-acetaminophen (PERCOCET/ROXICET) 5-325 MG per tablet 1 tablet (1 tablet Oral Given 03/15/21 1718)  iohexol (OMNIPAQUE) 350 MG/ML injection 80 mL (80 mLs Intravenous Contrast Given 03/15/21 1725)  sodium chloride 0.9 % bolus 1,000 mL (1,000 mLs Intravenous New Bag/Given 03/15/21 2122)  morphine 4 MG/ML injection 4 mg (4 mg Intravenous Given 03/15/21 2121)  ondansetron (ZOFRAN) injection 4 mg (4 mg Intravenous Given 03/15/21 2121)  cefTRIAXone (ROCEPHIN) 1 g in sodium chloride 0.9 % 100 mL IVPB (0 g Intravenous Stopped 03/15/21 2213)  metroNIDAZOLE (FLAGYL) IVPB 500 mg (0 mg Intravenous Stopped 03/15/21 2213)  amLODipine (NORVASC) tablet 5 mg (5 mg Oral Given 03/15/21 2232)  morphine 4 MG/ML injection 4 mg (4 mg Intravenous Given 03/15/21 2233)    ED Course  I have reviewed the triage vital signs and the nursing notes.  Pertinent labs & imaging results that were available during my care of the patient were reviewed by me and considered in my medical decision making (see chart for details).    MDM Rules/Calculators/A&P                          CC: Abdominal pain, pelvic discomfort  This patient complains of abdominal pain, pelvic discomfort; this involves an extensive number of treatment options and is a complaint that carries with it a high risk of complications and morbidity. Vital signs were reviewed. Serious etiologies considered.  Record review:   Previous records obtained and reviewed   Additional  history obtained from family at bedside  Work up as above, notable for:  Labs & imaging results that were available during my care of the patient were reviewed by me and considered in my medical decision making.   Urinalysis concerning for acute infection, patient with systemic symptoms, Donnell pain, nausea and vomiting there is concern for pyelonephritis.  Patient significant difficulty tolerating oral intake, nausea or vomiting  Patient also with concern for STI, she was being treated for presumed STI but was unable to fill medications 2/2 prescription costs. She refused to allow for me to perform a pelvic exam or to obtain STI swabs. She has malodorous vaginal discharge, pelvic pain, urinary symptoms.   I ordered imaging studies which included CT a/p and I independently visualized and interpreted imaging which showed possible labia abnormality, this was not present on physical exam; doubt nec fasc.  Management: IVF, analgesics, anti-emetics, antibiotics  Reassessment:  Patient with ongoing abdominal discomfort, nausea and vomiting.  Unable tolerate oral intake.  Patient unable to fill her outpatient prescriptions including antibiotics for presumed STI.  At this time concern for STI versus pyelonephritis.  Initiate intravenous antibiotics to treat these ailments.  IV fluids, ongoing antiemetics and analgesics.    Patient with elevated blood pressure reading, asymptomatic hypertension.  Does not take home blood pressure medications any longer.  Give oral Norvasc.  Recommend admission for IV rehydration, antiemetics, antibiotics.  Patient agreeable.  D/w hospitalist who accepts patient for admission         This chart was dictated using voice recognition software.  Despite best efforts to proofread,  errors can occur which can change the documentation meaning.  Final Clinical Impression(s) / ED Diagnoses Final diagnoses:  Pyelonephritis  STI (sexually transmitted infection)   Intractable nausea and vomiting  Intractable abdominal pain  Elevated blood pressure reading    Rx / DC Orders ED Discharge Orders     None        Jeanell Sparrow, DO 03/15/21 2304

## 2021-03-15 NOTE — ED Notes (Addendum)
Pts visitor approached nursing station stating the pt needed to use the restroom. Pt began yelling profanity and raising her voice at staff when staff offered assistance to the restroom. Staff informed pt that this behavior would not be tolerated, and pt was agreeable with staff.

## 2021-03-15 NOTE — ED Provider Notes (Signed)
Emergency Medicine Provider Triage Evaluation Note  Terri [name] , a 45 y.o. female  was evaluated in triage.  Pt complains of lower abdominal pain and cramping.  She reports associated nausea and vomiting.  Also complains of vaginal swelling.  She was seen here recently and diagnosed with PID but is been unable to take her medications as she cannot afford them.  Denies fever or chills.  Review of Systems  Positive:  Negative: See above   Physical Exam  BP (!) 181/138 (BP Location: Right Arm)   Pulse (!) 102   Temp 99.7 F (37.6 C) (Oral)   Resp 18   SpO2 99%  Gen:   Awake, appears uncomfortable. Tearful  Resp:  Normal effort  MSK:   Moves extremities without difficulty  Other:  Severe lower abdominal pain   Medical Decision Making  Medically screening exam initiated at 3:47 PM.  Appropriate orders placed.  Terri [name] was informed that the remainder of the evaluation will be completed by another provider, this initial triage assessment does not replace that evaluation, and the importance of remaining in the ED until their evaluation is complete.     Hendricks Limes, PA-C 03/15/21 [place], [place], DO 03/15/21 2304

## 2021-03-15 NOTE — ED Notes (Signed)
Pt c/o chest pains to Nts. EKG taken

## 2021-03-15 NOTE — ED Notes (Signed)
Pt stated, "why the fuck are you starting an IV before giving me my medication." The RN explained that an IV must be placed prior to IV medication administration. Patient disgruntled.

## 2021-03-15 NOTE — H&P (Signed)
History and Physical   Terri [name] DGU:[id number] DOB: 03/17/1976 DOA: 03/15/2021  PCP: Pcp, No   Patient coming from: Home  Chief Complaint: Vaginal pain, lower abdominal pain  HPI: Terri [name] is a 45 y.o. female with medical history significant of polysubstance use, anxiety, depression, hypertension, GI bleed, cellulitis, history of gastric bypass, bipolar who presents with concerns for lower abdominal pain and cramping.  Patient reports continued lower abdominal pain and cramping with some associated nausea vomiting and some vaginal swelling.  Vaginal swelling was confirmed by ED provider but he was not allowed to do internal exam by patient due to fear pain.  She was recently diagnosed with UTI and this included trach but I cannot see evidence of this and chart review.  She was discharged with prescription for doxycycline and Flagyl however she was unable to afford these medications and has not been taking them.  She since had this progressive worsening of her vaginal and lower abdominal pain concern for uterine pain.  She does report associated increased discharge, dysuria and vaginal discomfort as above.  She also reports significant nausea and vomiting.  She denies fevers, chills, chest pain, shortness of breath, constipation, diarrhea.  She states that she wants to stay to receive IV treatment as she has been unable to afford her outpatient medicine and her pain has become unbearable.  She has had a history of leaving AMA in the past but states that she generally wants to stay at this time.  ED Course: Vital signs in the ED significant for blood pressure in the 956L to 875I systolic.  Lab work-up showed CMP with sodium 133, albumin 3.3.  CBC without leukocytosis but did show stable hemoglobin of 9.8 and platelets of 451.  Lactic acid normal.  Lipase normal.  Urinalysis with hemoglobin leukocytes only.  Urine culture pending.  Blood culture pending.  CT on pelvis showed a tiny foci of  gas in the soft tissue of the labia majora suspected to be external folds but cannot rule out necrotizing infection based off their read.  The ED provider states did not see any evidence of this.  No acute abnormality noticed on the CT abdomen pelvis otherwise.  Patient received morphine, oxycodone, cefepime, Flagyl, Doxy in ED.  Also dose of amlodipine, Zofran and a liter of fluids.  Review of Systems: As per HPI otherwise all other systems reviewed and are negative.  Past Medical History:  Diagnosis Date   Arthritis    Back and legs   Bipolar disorder (HCC)    Chronic back pain    Depression    DVT (deep venous thrombosis) (HCC)    early 20's leg   History of blood transfusion    Hypertension    Migraine headache    Neuropathy    Pneumonia    Positive PPD    x 2 last time 09/2017   Sciatica    Stress incontinence     Past Surgical History:  Procedure Laterality Date   ALVEOLOPLASTY Bilateral 01/31/2018   Procedure: ALVEOLOPLASTY;  Surgeon: Diona Browner, DDS;  Location: [facility];  Service: Oral Surgery;  Laterality: Bilateral;   ANKLE SURGERY Left 1992   with hardware   BACK SURGERY     BIOPSY  02/08/2019   Procedure: BIOPSY;  Surgeon: Rush Landmark Telford Nab., MD;  Location: [facility];  Service: Gastroenterology;;   CESAREAN SECTION     CHOLECYSTECTOMY N/A 02/15/2017   Procedure: LAPAROSCOPIC CHOLECYSTECTOMY;  Surgeon: Clovis Riley, MD;  [facility]: [facility];  Service: General;  Laterality: N/A;   ENTEROSCOPY N/A 02/08/2019   Procedure: ENTEROSCOPY;  Surgeon: Rush Landmark Telford Nab., MD;  [facility];  Service: Gastroenterology;  Laterality: N/A;   ESOPHAGOGASTRODUODENOSCOPY N/A 01/17/2021   Procedure: ESOPHAGOGASTRODUODENOSCOPY (EGD);  Surgeon: Juanita Craver, MD;  Location: [facility];  Service: Endoscopy;  Laterality: N/A;   ESOPHAGOGASTRODUODENOSCOPY (EGD) WITH PROPOFOL N/A 09/15/2017   Procedure: ESOPHAGOGASTRODUODENOSCOPY (EGD) WITH PROPOFOL;  Surgeon: Clarene Essex, MD;  [facility];  Service: Endoscopy;  Laterality: N/A;   ESOPHAGOGASTRODUODENOSCOPY (EGD) WITH PROPOFOL N/A 02/08/2019   Procedure: ESOPHAGOGASTRODUODENOSCOPY (EGD) WITH PROPOFOL;  Surgeon: Rush Landmark Telford Nab., MD;  [facility];  Service: Gastroenterology;  Laterality: N/A;   GASTRIC BYPASS     INCISION AND DRAINAGE OF PERITONSILLAR ABCESS Left 08/13/2017   Procedure: INCISION AND DRAINAGE OF LEFT NECK ABSCESS;  Surgeon: Melida Quitter, MD;  [facility];  Service: ENT;  Laterality: Left;   LAPAROSCOPIC LYSIS OF ADHESIONS  02/15/2017   Procedure: LAPAROSCOPIC LYSIS OF ADHESIONS;  Surgeon: Clovis Riley, MD;  [facility];  Service: General;;   LUMBAR DISC SURGERY     LUMBAR FUSION     SAVORY DILATION N/A 02/08/2019   Procedure: SAVORY DILATION;  Surgeon: Irving Copas., MD;  [facility];  Service: Gastroenterology;  Laterality: N/A;   TOOTH EXTRACTION Bilateral 01/31/2018   Procedure: DENTAL RESTORATION/EXTRACTIONS;  Surgeon: Diona Browner, DDS;  Location: [facility];  Service: Oral Surgery;  Laterality: Bilateral;   TUBAL LIGATION     UPPER GI ENDOSCOPY N/A 02/15/2017   Procedure: UPPER GI ENDOSCOPY;  Surgeon: Clovis Riley, MD;  [facility];  Service: General;  Laterality: N/A;    Social History  reports that she has been smoking cigarettes. She has a 29.00 pack-year smoking history. She has never used smokeless tobacco. She reports current drug use. Drugs: IV and Cocaine. She reports that she does not drink alcohol.  Allergies  Allergen Reactions   Ketorolac Tromethamine Itching and Nausea And Vomiting   Methocarbamol Diarrhea    Family History  Problem Relation Age of Onset   Diabetes Mother    Hypertension Mother    Diabetes Father    Hypertension Father   Reviewed on Admission  Prior to Admission medications   Medication Sig Start Date End Date Taking? Authorizing Provider  acetaminophen (TYLENOL) 500 MG tablet  Take 1 tablet (500 mg total) by mouth every 6 (six) hours as needed for moderate pain. Patient not taking: Reported on 03/14/2021 01/31/18   Diona Browner, DMD  doxycycline (VIBRAMYCIN) 100 MG capsule Take 1 capsule (100 mg total) by mouth 2 (two) times daily. Patient not taking: Reported on 03/14/2021 03/12/21   Lacretia Leigh, MD  metroNIDAZOLE (FLAGYL) 500 MG tablet Take 1 tablet (500 mg total) by mouth 2 (two) times daily. Patient not taking: Reported on 03/14/2021 03/12/21   Lacretia Leigh, MD  pantoprazole (PROTONIX) 40 MG tablet Take 1 tablet (40 mg total) by mouth daily. 03/14/21   Orpah Greek, MD  sucralfate (CARAFATE) 1 GM/10ML suspension Take 10 mLs (1 g total) by mouth 4 (four) times daily -  with meals and at bedtime. 03/14/21   Orpah Greek, MD    Physical Exam: Vitals:   03/15/21 2200 03/15/21 2230 03/15/21 2300 03/15/21 2330  BP: (!) 211/130 (!) 214/125 (!) 199/138 (!) 188/137  Pulse: 71 84 80 94  Resp:  17  16  Temp:      TempSrc:  SpO2: 99% 100% 100% 99%   Physical Exam Constitutional:      General: She is not in acute distress.    Appearance: Normal appearance.  HENT:     Head: Normocephalic and atraumatic.     Mouth/Throat:     Mouth: Mucous membranes are moist.     Pharynx: Oropharynx is clear.  Eyes:     Extraocular Movements: Extraocular movements intact.     Pupils: Pupils are equal, round, and reactive to light.  Cardiovascular:     Rate and Rhythm: Normal rate and regular rhythm.     Pulses: Normal pulses.     Heart sounds: Normal heart sounds.  Pulmonary:     Effort: Pulmonary effort is normal. No respiratory distress.     Breath sounds: Normal breath sounds.  Abdominal:     General: Bowel sounds are normal. There is no distension.     Palpations: Abdomen is soft.     Tenderness: There is abdominal tenderness in the suprapubic area. There is guarding.  Musculoskeletal:        General: No swelling or deformity.  Skin:     General: Skin is warm and dry.  Neurological:     General: No focal deficit present.     Mental Status: Mental status is at baseline.   Labs on Admission: I have personally reviewed following labs and imaging studies  CBC: Recent Labs  Lab 03/12/21 2130 03/14/21 0130 03/15/21 1637  WBC 10.9* 10.4 8.2  NEUTROABS 8.1* 8.3*  --   HGB 10.2* 9.3* 9.8*  HCT 31.1* 28.7* 30.5*  MCV 67.0* 66.7* 67.5*  PLT 438* 434* 451*    Basic Metabolic Panel: Recent Labs  Lab 03/12/21 2130 03/14/21 0130 03/15/21 1637  NA 131* 133* 133*  K 3.2* 3.7 4.0  CL 95* 98 98  CO2 27 28 28   GLUCOSE 96 94 93  BUN 20 17 12   CREATININE 0.84 0.72 0.71  CALCIUM 9.0 8.9 9.1    GFR: Estimated Creatinine Clearance: 82.2 mL/min (by C-G formula based on SCr of 0.71 mg/dL).  Liver Function Tests: Recent Labs  Lab 03/12/21 2130 03/14/21 0130 03/15/21 1637  AST 13* 12* 14*  ALT 11 9 10   ALKPHOS 92 82 81  BILITOT 0.4 0.4 0.3  PROT 7.3 6.6 7.2  ALBUMIN 3.4* 3.0* 3.3*    Urine analysis:    Component Value Date/Time   COLORURINE STRAW (A) 03/15/2021 2015   APPEARANCEUR CLEAR 03/15/2021 2015   LABSPEC 1.025 03/15/2021 2015   PHURINE 7.0 03/15/2021 2015   GLUCOSEU NEGATIVE 03/15/2021 2015   HGBUR SMALL (A) 03/15/2021 2015   BILIRUBINUR NEGATIVE 03/15/2021 2015   KETONESUR NEGATIVE 03/15/2021 2015   PROTEINUR NEGATIVE 03/15/2021 2015   UROBILINOGEN 1.0 02/09/2015 1228   NITRITE NEGATIVE 03/15/2021 2015   LEUKOCYTESUR LARGE (A) 03/15/2021 2015    Radiological Exams on Admission: CT ABDOMEN PELVIS W CONTRAST  Result Date: 03/15/2021 CLINICAL DATA:  Nonlocalized acute abdominal pain. Abdomen pain lower cramping Pelvic inflammatory disease and STD per patient. EXAM: CT ABDOMEN AND PELVIS WITH CONTRAST TECHNIQUE: Multidetector CT imaging of the abdomen and pelvis was performed using the standard protocol following bolus administration of intravenous contrast. CONTRAST:  78mL OMNIPAQUE IOHEXOL 350  MG/ML SOLN COMPARISON:  CT abdomen pelvis 02/26/2021 FINDINGS: Lower chest: Bibasilar linear atelectasis versus scarring. Hepatobiliary: Indeterminate oval 1.1 cm right hepatic lobe hypodensity (2:22). Status post cholecystectomy. No biliary dilatation. Pancreas: No focal lesion. Normal pancreatic contour. No surrounding inflammatory changes. No main  pancreatic ductal dilatation. Spleen: Normal in size without focal abnormality. Adrenals/Urinary Tract: No adrenal nodule bilaterally. Bilateral kidneys enhance symmetrically. No hydronephrosis. No hydroureter. The urinary bladder is unremarkable. On delayed imaging, there is no urothelial wall thickening and there are no filling defects in the opacified portions of the bilateral collecting systems or ureters. Stomach/Bowel: Gastric Roux-en-Y surgical bypass surgical changes. Stomach is within normal limits. No evidence of bowel wall thickening or dilatation. Stool throughout the colon. The appendix not definitely identified. Vascular/Lymphatic: No abdominal aorta or iliac aneurysm. Severe atherosclerotic plaque of the aorta and its branches. No abdominal, pelvic, or inguinal lymphadenopathy. Reproductive: Markedly limited evaluation of the pelvis due to lack of intraperitoneal fat. Uterus and bilateral adnexa are unremarkable. Other: No intraperitoneal free fluid. No intraperitoneal free gas. No organized fluid collection. Musculoskeletal: Several tiny foci of gas along the subcutaneus soft tissues of the bilateral labia majora. No suspicious lytic or blastic osseous lesions. No acute displaced fracture. Multilevel degenerative changes of the spine. IMPRESSION: 1. Several tiny foci of gas along the subcutaneus soft tissues of the bilateral labia majora. Gas could be within folds external to the soft tissues but this is unlikely given the distribution on coronal imaging. No organized fluid collection. Necrotizing fasciitis cannot be excluded as this is a clinical  diagnosis. Recommend correlation with physical exam. 2. No acute intra-abdominal or intrapelvic abnormality with slightly limited evaluation due to lack of intraperitoneal fat. Given history, please consider pelvic ultrasound if clinically indicated. 3. Stool throughout the colon. Electronically Signed   By: Iven Finn M.D.   On: 03/15/2021 18:00    EKG: Not yet performed  Assessment/Plan Principal Problem:   PID (pelvic inflammatory disease) Active Problems:   Essential hypertension   GIB (gastrointestinal bleeding)   S/P gastric bypass   Polysubstance abuse ([place])  STI versus UTI PID > Patient with recent evaluation and treatment for STI in the ED though is unable to find these lab results.  Was discharged with doxycycline and Flagyl however was unable to afford the medication and has not been taking it.  Has had progressive vaginal discomfort and lower abdominal pain consistent with uterine pain.  Concern for possible PID > Conflicting picture as we do not have the initial lab for STI and CT did not show evidence of PID however she does have significant vaginal and what seems like urine abdominal pain. > Urinalysis showed leukocytes but no bacteria or nitrites.  Could be that her initial antibiotic treatment has altered the results of her urinalysis. -We will monitor overnight -Continue with IV ceftriaxone, doxycycline, Flagyl to cover for PID - Could consider discussion/consultation with OB/GYN for them to weigh in on possible PID presentation or if they suspect a different process. - Trend fever curve and white count - We will continue with pain medication for now with as needed Norco and Dilaudid for breakthrough pain.  (Patient does have a history of opioid use and still have a higher tolerance however will also be vigilant for possibility of medication seeking.) - Test for BV, Trich, GC/committee, candidal infection, RPR, HIV  Hypertension > Blood pressure elevated in the 416S  to 063K systolic in ED.  Not currently on any medications for this.  No symptoms from her hypertension at this time. > Received amlodipine in ED - Continue with amlodipine daily - Can consider adding as needed medications if symptomatic or persistently high blood pressure. (May Improve with pain control)  Substance use > Has cut down but still  uses some cocaine and heroin. > We will have increase pain tolerance but will have to watch out for possible medication seeking behavior, as above. - Continue to monitor  Anemia Hx of GI Bleed > Note, patient is status post gastric bypass > Hgb stable at 9.8.  No significant bloody stools. - Trend CBC  DVT prophylaxis: Lovenox  Code Status:   Full Family Communication:  Significant other updated at bedside Disposition Plan:   Patient is from:  Home  Anticipated DC to:  Home  Anticipated DC date:  1 to 3 days  Anticipated DC barriers: None  Consults called:  None, could consider discussing with OB/GYN.   Admission status:  Observation, MedSurg with continuous pulse ox  Severity of Illness: The appropriate patient status for this patient is OBSERVATION. Observation status is judged to be reasonable and necessary in order to provide the required intensity of service to ensure the patient's safety. The patient's presenting symptoms, physical exam findings, and initial radiographic and laboratory data in the context of their medical condition is felt to place them at decreased risk for further clinical deterioration. Furthermore, it is anticipated that the patient will be medically stable for discharge from the hospital within 2 midnights of admission.     Marcelyn Bruins MD [facility]  How to contact the [facility] Attending or Consulting provider Time or covering provider during after hours [place], for this patient?   Check the care team in [facility] and look for a) attending/consulting TRH provider listed and b) the [facility] team listed Log into  [email or web address] and use [place]'s universal password to access. If you do not have the password, please contact the hospital operator. Locate the [facility] provider you are looking for under [facility] and page to a number that you can be directly reached. If you still have difficulty reaching the provider, please page the [facility] (Director on Call) for the Hospitalists listed on amion for assistance.  03/15/2021, 11:58 PM

## 2021-03-15 NOTE — ED Triage Notes (Signed)
States she has a bleeding ulcer and a hx of PID. Reports abdominal pain and cramping. Reports N/V, denies diarrhea. States her vagina is swollen. Pt has trich.

## 2021-03-15 NOTE — Care Management (Signed)
Patient has Medicaid therefore she is not eligible for [facility] medication assistance. Her medication should cost 3 dollars

## 2021-03-15 NOTE — ED Notes (Signed)
Patient is sitting outside in the emergency department entrance.

## 2021-03-15 NOTE — ED Notes (Signed)
Pt stated she needed to use the restroom but refused assistance to the restroom, purewick and bedpan.

## 2021-03-15 NOTE — ED Notes (Signed)
Pt refused 2nd lab draw for 2nd set of blood cultures.

## 2021-03-16 ENCOUNTER — Encounter (HOSPITAL_COMMUNITY): Payer: Self-pay | Admitting: Internal Medicine

## 2021-03-16 DIAGNOSIS — N739 Female pelvic inflammatory disease, unspecified: Secondary | ICD-10-CM | POA: Diagnosis not present

## 2021-03-16 DIAGNOSIS — D500 Iron deficiency anemia secondary to blood loss (chronic): Secondary | ICD-10-CM | POA: Diagnosis present

## 2021-03-16 DIAGNOSIS — I160 Hypertensive urgency: Secondary | ICD-10-CM | POA: Diagnosis present

## 2021-03-16 DIAGNOSIS — Z981 Arthrodesis status: Secondary | ICD-10-CM | POA: Diagnosis not present

## 2021-03-16 DIAGNOSIS — K2971 Gastritis, unspecified, with bleeding: Secondary | ICD-10-CM | POA: Diagnosis not present

## 2021-03-16 DIAGNOSIS — N730 Acute parametritis and pelvic cellulitis: Secondary | ICD-10-CM | POA: Diagnosis present

## 2021-03-16 DIAGNOSIS — I10 Essential (primary) hypertension: Secondary | ICD-10-CM | POA: Diagnosis present

## 2021-03-16 DIAGNOSIS — Z9884 Bariatric surgery status: Secondary | ICD-10-CM | POA: Diagnosis not present

## 2021-03-16 DIAGNOSIS — Z833 Family history of diabetes mellitus: Secondary | ICD-10-CM | POA: Diagnosis not present

## 2021-03-16 DIAGNOSIS — R1084 Generalized abdominal pain: Secondary | ICD-10-CM | POA: Diagnosis not present

## 2021-03-16 DIAGNOSIS — E871 Hypo-osmolality and hyponatremia: Secondary | ICD-10-CM | POA: Diagnosis present

## 2021-03-16 DIAGNOSIS — Z8249 Family history of ischemic heart disease and other diseases of the circulatory system: Secondary | ICD-10-CM | POA: Diagnosis not present

## 2021-03-16 DIAGNOSIS — Z23 Encounter for immunization: Secondary | ICD-10-CM | POA: Diagnosis not present

## 2021-03-16 DIAGNOSIS — F1910 Other psychoactive substance abuse, uncomplicated: Secondary | ICD-10-CM | POA: Diagnosis not present

## 2021-03-16 DIAGNOSIS — F17210 Nicotine dependence, cigarettes, uncomplicated: Secondary | ICD-10-CM | POA: Diagnosis present

## 2021-03-16 DIAGNOSIS — K289 Gastrojejunal ulcer, unspecified as acute or chronic, without hemorrhage or perforation: Secondary | ICD-10-CM | POA: Diagnosis not present

## 2021-03-16 DIAGNOSIS — K284 Chronic or unspecified gastrojejunal ulcer with hemorrhage: Secondary | ICD-10-CM | POA: Diagnosis not present

## 2021-03-16 DIAGNOSIS — Z20822 Contact with and (suspected) exposure to covid-19: Secondary | ICD-10-CM | POA: Diagnosis present

## 2021-03-16 DIAGNOSIS — R1030 Lower abdominal pain, unspecified: Secondary | ICD-10-CM | POA: Diagnosis not present

## 2021-03-16 DIAGNOSIS — Z79899 Other long term (current) drug therapy: Secondary | ICD-10-CM | POA: Diagnosis not present

## 2021-03-16 DIAGNOSIS — Z888 Allergy status to other drugs, medicaments and biological substances status: Secondary | ICD-10-CM | POA: Diagnosis not present

## 2021-03-16 DIAGNOSIS — K922 Gastrointestinal hemorrhage, unspecified: Secondary | ICD-10-CM | POA: Diagnosis not present

## 2021-03-16 LAB — RESP PANEL BY RT-PCR (FLU A&B, COVID) ARPGX2
Influenza A by PCR: NEGATIVE
Influenza B by PCR: NEGATIVE
SARS Coronavirus 2 by RT PCR: NEGATIVE

## 2021-03-16 MED ORDER — HYDROXYZINE HCL 25 MG PO TABS
25.0000 mg | ORAL_TABLET | Freq: Three times a day (TID) | ORAL | Status: DC | PRN
  Administered 2021-03-16 – 2021-03-17 (×3): 25 mg via ORAL
  Filled 2021-03-16 (×3): qty 1

## 2021-03-16 MED ORDER — NICOTINE 14 MG/24HR TD PT24
14.0000 mg | MEDICATED_PATCH | Freq: Every day | TRANSDERMAL | Status: DC
  Administered 2021-03-16 – 2021-03-18 (×3): 14 mg via TRANSDERMAL
  Filled 2021-03-16 (×3): qty 1

## 2021-03-16 MED ORDER — HYDROCHLOROTHIAZIDE 25 MG PO TABS
25.0000 mg | ORAL_TABLET | Freq: Every day | ORAL | Status: DC
  Administered 2021-03-16 – 2021-03-18 (×3): 25 mg via ORAL
  Filled 2021-03-16 (×3): qty 1

## 2021-03-16 MED ORDER — HYDROMORPHONE HCL 1 MG/ML IJ SOLN
1.0000 mg | INTRAMUSCULAR | Status: DC | PRN
  Administered 2021-03-16 – 2021-03-17 (×12): 1 mg via INTRAVENOUS
  Filled 2021-03-16 (×12): qty 1

## 2021-03-16 MED ORDER — LABETALOL HCL 5 MG/ML IV SOLN
5.0000 mg | INTRAVENOUS | Status: DC | PRN
  Filled 2021-03-16 (×2): qty 4

## 2021-03-16 MED ORDER — AMLODIPINE BESYLATE 10 MG PO TABS
10.0000 mg | ORAL_TABLET | Freq: Every day | ORAL | Status: DC
  Administered 2021-03-17 – 2021-03-18 (×2): 10 mg via ORAL
  Filled 2021-03-16 (×2): qty 1

## 2021-03-16 MED ORDER — DOXYCYCLINE HYCLATE 100 MG PO TABS
100.0000 mg | ORAL_TABLET | Freq: Two times a day (BID) | ORAL | Status: DC
  Administered 2021-03-16 – 2021-03-18 (×5): 100 mg via ORAL
  Filled 2021-03-16 (×5): qty 1

## 2021-03-16 MED ORDER — INFLUENZA VAC SPLIT QUAD 0.5 ML IM SUSY
0.5000 mL | PREFILLED_SYRINGE | INTRAMUSCULAR | Status: AC
  Administered 2021-03-18: 0.5 mL via INTRAMUSCULAR
  Filled 2021-03-16: qty 0.5

## 2021-03-16 MED ORDER — HYDROXYZINE HCL 25 MG PO TABS
25.0000 mg | ORAL_TABLET | Freq: Once | ORAL | Status: AC
  Administered 2021-03-16: 25 mg via ORAL
  Filled 2021-03-16: qty 1

## 2021-03-16 NOTE — TOC Initial Note (Signed)
Transition of Care [facility]) - Initial/Assessment Note   Patient Details  Name: Terri [name] MRN: [id number] Date of Birth: [birth year]  Transition of Care [facility]) CM/SW Contact:    Sherie Don, LCSW Phone Number: 03/16/2021, 11:08 AM  Clinical Narrative: [facility] consulted for medication assistance, but patient has Medicaid and is not eligible for medication assistance. Medication assistance was also requested during the patient's previous admission. CSW spoke with patient to explain that TOC cannot provide medication assistance to insured patients. Patient also reported she is homeless and living in her car. CSW offered shelter list. Patient reported she has already contacted homeless shelters and was unable to find a bed, but will accept the shelter list. CSW provided patient with shelter list. [place] signing off, but can be consulted again as needed.  Barriers to Discharge: Continued Medical Work up, Other (must enter comment) (Patient is homeless and living in her car.)  Patient Goals and CMS Choice Choice offered to / list presented to : NA  Expected Discharge Plan and Services In-house Referral: Clinical Social Work Post Acute Care Choice: NA            DME Arranged: N/A DME Agency: NA  Prior Living Arrangements/Services Lives with:: Self Patient language and need for interpreter reviewed:: Yes Need for Family Participation in Patient Care: No (Comment) Care giver support system in place?: Yes (comment) Criminal Activity/Legal Involvement Pertinent to Current Situation/Hospitalization: No - Comment as needed  Activities of Daily Living Home Assistive Devices/Equipment: Eyeglasses ADL Screening (condition at time of admission) Patient's cognitive ability adequate to safely complete daily activities?: Yes Is the patient deaf or have difficulty hearing?: No Does the patient have difficulty seeing, even when wearing glasses/contacts?: No Does the patient have difficulty concentrating,  remembering, or making decisions?: No Patient able to express need for assistance with ADLs?: Yes Does the patient have difficulty dressing or bathing?: No Independently performs ADLs?: Yes (appropriate for developmental age) Does the patient have difficulty walking or climbing stairs?: No Weakness of Legs: None Weakness of Arms/Hands: None  Emotional Assessment Appearance:: Appears older than stated age Attitude/Demeanor/Rapport: Engaged Affect (typically observed): Accepting Orientation: : Oriented to Self, Oriented to Place, Oriented to  Time, Oriented to Situation  Admission diagnosis:  PID (pelvic inflammatory disease) [N73.9] Elevated blood pressure reading [R03.0] Pyelonephritis [N12] STI (sexually transmitted infection) [A64] Intractable abdominal pain [R10.9] Intractable nausea and vomiting [R11.2] Patient Active Problem List   Diagnosis Date Noted   PID (pelvic inflammatory disease) 03/15/2021   Microcytic anemia 02/26/2021   Mild protein malnutrition ([place]) 02/26/2021   Polysubstance abuse ([place]) 01/16/2021   Abdominal pain 01/14/2021   Ulcer at site of surgical anastomosis following bypass of stomach 01/13/2021   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 coli) ([place]) 02/11/2019   AKI (acute kidney injury) ([place])    Gastric ulcer 02/09/2019   S/P gastric bypass 02/09/2019   Persistent fever 02/09/2019   COVID-19 virus not detected    Chest pain    Bacteremia due to Escherichia coli    Esophagitis    Hypokalemia    Hypomagnesemia    Sepsis ([place]) 02/02/2019   Leucocytosis 02/02/2019   Thrombocytosis 02/02/2019   UTI (urinary tract infection) 02/02/2019   GIB (gastrointestinal bleeding) 09/13/2017  Apnea  09/13/2017   Essential hypertension    Cellulitis and abscess of neck 08/12/2017   Cellulitis of submandibular region 08/10/2017   Odontogenic infection of jaw 08/10/2017   Cholecystitis 02/14/2017   Opioid abuse with opioid-induced mood disorder ([place]) 01/25/2017   Anxiety 02/11/2014   Depression 07/12/2012   PCP:  Pcp, No Pharmacy:   [id number] - [place], [place] [address] Phone: [phone] Fax: [phone]  Readmission Risk Interventions No flowsheet data found.

## 2021-03-16 NOTE — Progress Notes (Signed)
Patient has refused labs for the third time. I will try again later this morning.

## 2021-03-16 NOTE — Progress Notes (Signed)
NP [place] messaged about patient asking for Ativan.

## 2021-03-16 NOTE — ED Notes (Signed)
ED TO INPATIENT HANDOFF REPORT  Name/Age/Gender Terri [name] 45 y.o. female  Code Status    Code Status Orders  (From admission, onward)           Start     Ordered   03/15/21 2339  Full code  Continuous        03/15/21 2340           Code Status History     Date Active Date Inactive Code Status Order ID Comments User Context   02/26/2021 0740 03/02/2021 1429 Full Code [id number]  Reubin Milan, MD ED   01/17/2021 0138 01/18/2021 1622 Full Code [id number]  Chotiner, Yevonne Aline, MD Inpatient   01/14/2021 1739 01/16/2021 1246 Full Code [id number]  Geradine Girt, DO ED   01/13/2021 1456 01/14/2021 1151 Full Code [id number]  Jacqlyn Krauss, MD ED   07/16/2019 0003 07/16/2019 1423 Full Code [id number]  Montine Circle, PA-C ED   02/02/2019 2333 02/11/2019 1754 Full Code [id number]  Elwyn Reach, MD Inpatient   09/13/2017 0912 09/15/2017 2035 Full Code [id number]  Kayleen Memos, DO ED   08/10/2017 2257 08/19/2017 1320 Full Code [id number]  Etta Quill, DO ED   02/14/2017 1535 02/16/2017 1347 Full Code [id number]  Earnstine Regal, PA-C Inpatient   01/24/2017 2033 01/26/2017 1501 Full Code [id number]  Blanchie Dessert, MD ED   02/10/2014 2323 02/11/2014 1544 Full Code [id number]  Artis Delay, MD ED   06/21/2012 2117 06/22/2012 0953 Full Code [id number]  Linus Mako, PA ED       Home/SNF/Other Home  Chief Complaint PID (pelvic inflammatory disease) [N73.9]  Level of Care/Admitting Diagnosis ED Disposition     ED Disposition  Admit   Condition  --   [facility]: [facility] [[id number]]  Level of Care: Med-Surg [16]  May place patient in observation at [facility] or [facility] if equivalent level of care is available:: Yes  Covid Evaluation: Asymptomatic Screening Protocol (No Symptoms)  Diagnosis: PID (pelvic inflammatory disease) [720947]  Admitting Physician: Marcelyn Bruins [[id number]]  Attending Physician: Marcelyn Bruins  [phone]          Medical History Past Medical History:  Diagnosis Date   Arthritis    Back and legs   Bipolar disorder ([place])    Chronic back pain    Depression    DVT (deep venous thrombosis) ([place])    early 20's leg   History of blood transfusion    Hypertension    Migraine headache    Neuropathy    Pneumonia    Positive PPD    x 2 last time 09/2017   Sciatica    Stress incontinence     Allergies Allergies  Allergen Reactions   Ketorolac Tromethamine Itching and Nausea And Vomiting   Methocarbamol Diarrhea    IV Location/Drains/Wounds Patient Lines/Drains/Airways Status     Active Line/Drains/Airways     Name Placement date Placement time Site Days   Peripheral IV 03/15/21 20 G 1" Distal;Left;Posterior Forearm 03/15/21  2112  Forearm  1            Labs/Imaging Results for orders placed or performed during the hospital encounter of 03/15/21 (from the past 48 hour(s))  Lipase, blood     Status: None   Collection Time: 03/15/21  4:37 PM  Result Value Ref Range   Lipase 42 11 - 51 U/L    Comment: Performed at [facility], [address] [place], [place] [address]  Comprehensive metabolic panel     Status: Abnormal   Collection Time: 03/15/21  4:37 PM  Result Value Ref Range   Sodium 133 (L) 135 - 145 mmol/L   Potassium 4.0 3.5 - 5.1 mmol/L   Chloride 98 98 - 111 mmol/L   CO2 28 22 - 32 mmol/L   Glucose, Bld 93 70 - 99 mg/dL    Comment: Glucose reference range applies only to samples taken after fasting for at least 8 hours.   BUN 12 6 - 20 mg/dL   Creatinine, Ser 0.71 0.44 - 1.00 mg/dL   Calcium 9.1 8.9 - 10.3 mg/dL   Total Protein 7.2 6.5 - 8.1 g/dL   Albumin 3.3 (L) 3.5 - 5.0 g/dL   AST 14 (L) 15 - 41 U/L   ALT 10 0 - 44 U/L   Alkaline Phosphatase 81 38 - 126 U/L   Total Bilirubin 0.3 0.3 - 1.2 mg/dL   GFR, Estimated >60 >60 mL/min    Comment: (NOTE) Calculated using the CKD-EPI Creatinine Equation (2021)    Anion gap 7  5 - 15    Comment: Performed at [facility], [place] [address], [place], [place] [address]  CBC     Status: Abnormal   Collection Time: 03/15/21  4:37 PM  Result Value Ref Range   WBC 8.2 4.0 - 10.5 K/uL   RBC 4.52 3.87 - 5.11 MIL/uL   Hemoglobin 9.8 (L) 12.0 - 15.0 g/dL   HCT 30.5 (L) 36.0 - 46.0 %   MCV 67.5 (L) 80.0 - 100.0 fL   MCH 21.7 (L) 26.0 - 34.0 pg   MCHC 32.1 30.0 - 36.0 g/dL   RDW 20.3 (H) 11.5 - 15.5 %   Platelets 451 (H) 150 - 400 K/uL   nRBC 0.0 0.0 - 0.2 %    Comment: Performed at [facility], [place] [address] [place], [place] [address]  I-Stat beta hCG blood, ED     Status: None   Collection Time: 03/15/21  4:45 PM  Result Value Ref Range   I-stat hCG, quantitative <5.0 <5 mIU/mL   Comment 3            Comment:   GEST. AGE      CONC.  (mIU/mL)   <=1 WEEK        5 - 50     2 WEEKS       50 - 500     3 WEEKS       100 - 10,000     4 WEEKS     1,000 - 30,000        FEMALE AND NON-PREGNANT FEMALE:     LESS THAN 5 mIU/mL   Urinalysis, Routine w reflex microscopic Urine, Clean Catch     Status: Abnormal   Collection Time: 03/15/21  8:15 PM  Result Value Ref Range   Color, Urine STRAW (A) YELLOW   APPearance CLEAR CLEAR   Specific Gravity, Urine 1.025 1.005 - 1.030   pH 7.0 5.0 - 8.0   Glucose, UA NEGATIVE NEGATIVE mg/dL   Hgb urine dipstick SMALL (A) NEGATIVE   Bilirubin Urine NEGATIVE NEGATIVE   Ketones, ur NEGATIVE NEGATIVE mg/dL   Protein, ur NEGATIVE NEGATIVE mg/dL   Nitrite NEGATIVE NEGATIVE   Leukocytes,Ua LARGE (A) NEGATIVE   RBC / HPF 6-10 0 - 5 RBC/hpf   WBC, UA 21-50 0 - 5 WBC/hpf   Bacteria,  UA NONE SEEN NONE SEEN   Squamous Epithelial / LPF 0-5 0 - 5    Comment: Performed at [facility], [place] [address] [place], [place] [address]  Lactic acid, plasma     Status: None   Collection Time: 03/15/21  8:44 PM  Result Value Ref Range   Lactic Acid, Venous 0.9 0.5 - 1.9 mmol/L    Comment:  Performed at [facility], [place] [address] [place], [place] [address]   CT ABDOMEN PELVIS W CONTRAST  Result Date: 03/15/2021 CLINICAL DATA:  Nonlocalized acute abdominal pain. Abdomen pain lower cramping Pelvic inflammatory disease and STD per patient. EXAM: CT ABDOMEN AND PELVIS WITH CONTRAST TECHNIQUE: Multidetector CT imaging of the abdomen and pelvis was performed using the standard protocol following bolus administration of intravenous contrast. CONTRAST:  29mL OMNIPAQUE IOHEXOL 350 MG/ML SOLN COMPARISON:  CT abdomen pelvis 02/26/2021 FINDINGS: Lower chest: Bibasilar linear atelectasis versus scarring. Hepatobiliary: Indeterminate oval 1.1 cm right hepatic lobe hypodensity (2:22). Status post cholecystectomy. No biliary dilatation. Pancreas: No focal lesion. Normal pancreatic contour. No surrounding inflammatory changes. No main pancreatic ductal dilatation. Spleen: Normal in size without focal abnormality. Adrenals/Urinary Tract: No adrenal nodule bilaterally. Bilateral kidneys enhance symmetrically. No hydronephrosis. No hydroureter. The urinary bladder is unremarkable. On delayed imaging, there is no urothelial wall thickening and there are no filling defects in the opacified portions of the bilateral collecting systems or ureters. Stomach/Bowel: Gastric Roux-en-Y surgical bypass surgical changes. Stomach is within normal limits. No evidence of bowel wall thickening or dilatation. Stool throughout the colon. The appendix not definitely identified. Vascular/Lymphatic: No abdominal aorta or iliac aneurysm. Severe atherosclerotic plaque of the aorta and its branches. No abdominal, pelvic, or inguinal lymphadenopathy. Reproductive: Markedly limited evaluation of the pelvis due to lack of intraperitoneal fat. Uterus and bilateral adnexa are unremarkable. Other: No intraperitoneal free fluid. No intraperitoneal free gas. No organized fluid collection. Musculoskeletal: Several tiny foci of  gas along the subcutaneus soft tissues of the bilateral labia majora. No suspicious lytic or blastic osseous lesions. No acute displaced fracture. Multilevel degenerative changes of the spine. IMPRESSION: 1. Several tiny foci of gas along the subcutaneus soft tissues of the bilateral labia majora. Gas could be within folds external to the soft tissues but this is unlikely given the distribution on coronal imaging. No organized fluid collection. Necrotizing fasciitis cannot be excluded as this is a clinical diagnosis. Recommend correlation with physical exam. 2. No acute intra-abdominal or intrapelvic abnormality with slightly limited evaluation due to lack of intraperitoneal fat. Given history, please consider pelvic ultrasound if clinically indicated. 3. Stool throughout the colon. Electronically Signed   By: Iven Finn M.D.   On: 03/15/2021 18:00    Pending Labs Unresulted Labs (From admission, onward)     Start     Ordered   03/22/21 0500  Creatinine, serum  (enoxaparin (LOVENOX)    CrCl >/= 30 ml/min)  Weekly,   R     Comments: while on enoxaparin therapy    03/15/21 2340   03/16/21 0500  Comprehensive metabolic panel  Tomorrow morning,   R        03/15/21 2340   03/16/21 0500  CBC  Tomorrow morning,   R        03/15/21 2340   03/15/21 2353  RPR  Once,   R        03/15/21 2359   03/15/21 2353  HIV Antibody (routine testing w rflx)  (HIV Antibody (Routine  testing w reflex) panel)  Once,   R        03/15/21 2359   03/15/21 2347  Resp Panel by RT-PCR (Flu A&B, Covid) Nasopharyngeal Swab  (Tier 2 - Symptomatic/asymptomatic)  Once,   R        03/15/21 2346   03/15/21 2044  Urine Culture  Once,   STAT       Question:  Indication  Answer:  Suprapubic pain   03/15/21 2043   03/15/21 2044  Blood culture (routine x 2)  BLOOD CULTURE X 2,   STAT      03/15/21 2043            Vitals/Pain Today's Vitals   03/15/21 2230 03/15/21 2300 03/15/21 2330 03/15/21 2347  BP: (!) 214/125 (!)  199/138 (!) 188/137   Pulse: 84 80 94   Resp: 17  16   Temp:      TempSrc:      SpO2: 100% 100% 99%   PainSc:    9     Isolation Precautions No active isolations  Medications Medications  doxycycline (VIBRAMYCIN) 100 mg in sodium chloride 0.9 % 250 mL IVPB (100 mg Intravenous New Bag/Given 03/15/21 2236)  cefTRIAXone (ROCEPHIN) 1 g in sodium chloride 0.9 % 100 mL IVPB (has no administration in time range)  doxycycline (VIBRAMYCIN) 100 mg in sodium chloride 0.9 % 250 mL IVPB (has no administration in time range)  metroNIDAZOLE (FLAGYL) IVPB 500 mg (has no administration in time range)  pantoprazole (PROTONIX) EC tablet 40 mg (has no administration in time range)  amLODipine (NORVASC) tablet 5 mg (has no administration in time range)  enoxaparin (LOVENOX) injection 40 mg (has no administration in time range)  sodium chloride flush (NS) 0.9 % injection 3 mL (has no administration in time range)  acetaminophen (TYLENOL) tablet 650 mg (has no administration in time range)    Or  acetaminophen (TYLENOL) suppository 650 mg (has no administration in time range)  polyethylene glycol (MIRALAX / GLYCOLAX) packet 17 g (has no administration in time range)  HYDROcodone-acetaminophen (NORCO/VICODIN) 5-325 MG per tablet 1-2 tablet (has no administration in time range)  HYDROmorphone (DILAUDID) injection 1 mg (has no administration in time range)  oxyCODONE-acetaminophen (PERCOCET/ROXICET) 5-325 MG per tablet 1 tablet (1 tablet Oral Given 03/15/21 1718)  iohexol (OMNIPAQUE) 350 MG/ML injection 80 mL (80 mLs Intravenous Contrast Given 03/15/21 1725)  sodium chloride 0.9 % bolus 1,000 mL (1,000 mLs Intravenous New Bag/Given 03/15/21 2122)  morphine 4 MG/ML injection 4 mg (4 mg Intravenous Given 03/15/21 2121)  ondansetron (ZOFRAN) injection 4 mg (4 mg Intravenous Given 03/15/21 2121)  cefTRIAXone (ROCEPHIN) 1 g in sodium chloride 0.9 % 100 mL IVPB (0 g Intravenous Stopped 03/15/21 2213)   metroNIDAZOLE (FLAGYL) IVPB 500 mg (0 mg Intravenous Stopped 03/15/21 2213)  amLODipine (NORVASC) tablet 5 mg (5 mg Oral Given 03/15/21 2232)  morphine 4 MG/ML injection 4 mg (4 mg Intravenous Given 03/15/21 2233)    Mobility walks with person assist

## 2021-03-16 NOTE — Progress Notes (Signed)
PROGRESS NOTE    Terri [name]  WNI:[id number] DOB: 21-Jul-1975 DOA: 03/15/2021 PCP: Merryl Hacker, No    Brief Narrative:  Terri [name] was admitted to the hospital with working diagnosis of pelvic inflammatory disease/urine tract infection.  45 year old female past medical history for full substance abuse, anxiety, depression, bipolar, gastrointestinal bleed, status post gastric bypass surgery who presented with vaginal and lower abdominal pain.  Reported persistent lower abdominal pain, associated with nausea and vaginal swelling.  She did not allow to have a pelvic exam.  She had recent diagnosis of urine tract infection.  She was prescribed metronidazole and doxycycline.  Because of persistent symptoms she came back to the hospital.  Positive vaginal discharge, dysuria and vaginal discomfort.  On her initial physical examination her blood pressure was 211/130, heart rate 71, respiratory 17, ox saturation 99%.  Her lungs are clear to auscultation bilaterally, heart S1-S2, present, rhythmic, the abdomen was tender to palpation mainly in the suprapubic region, positive guarding, no lower extremity edema.  Sodium 133, potassium 3.7, chloride 98, bicarb 28, glucose 94, BUN 17, creatinine 0.72, white count 10.4, hemoglobin 9.3, hematocrit 28.7, platelets 434. SARS COVID-19 was negative.  Urinalysis specific gravity 1.025, 6-10 red cells, 21-50 white cells.  CT of the pelvis showing several tiny foci of gas along the subcutaneous soft tissues of the bilateral labia majora.  Gas could be within false external to the soft tissues but it is unlikely given distribution and coronal imaging.  Necrotizing fasciitis cannot be excluded.  No acute intra-abdominal intrapelvic abnormality.  EKG 91 bpm, normal axis, normal intervals, sinus rhythm, no significant ST segment or T wave changes, positive LVH.  She was placed on broad-spectrum antibiotic therapy including ceftriaxone, metronidazole and  doxycycline.   Assessment & Plan:   Principal Problem:   PID (pelvic inflammatory disease) Active Problems:   Essential hypertension   GIB (gastrointestinal bleeding)   S/P gastric bypass   Polysubstance abuse (HCC)   Pelvic inflammatory disease, urine infection.  Patient continue to have pain at the genital area that is radiated to the abdomen. No nausea or vomiting.  She has been afebrile and no leukocytosis.  Plan to continue with broad spectrum antibiotic therapy with ceftriaxone, metronidazole and doxycycline.  Continue close follow up on cell count and temperature curve.  2. Uncontrolled HTN. Urgency.  Continue blood pressure control with amlodipine, increase the dose to 10 mg and will add HCTZ.  3. Substance abuse.  No clinical signs of withdrawal, continue close monitoring.  Nicotine patch   4. Anemia (chronic) history of GI bleeding. Hgb and hct stable with no signs of bleeding    Patient continue to be at high risk for worsening infection   Status is: Observation  The patient will require care spanning > 2 midnights and should be moved to inpatient because: IV antibiotics       DVT prophylaxis:  Enoxaparin   Code Status:    full  Family Communication:   No family at the bedside      Antimicrobials:  Ceftriaxone, metronidazole and doxycycline     Subjective: Patient with no nausea or vomiting, continue to have pain in the genital area, no no dyspnea or chest pain   Objective: Vitals:   03/16/21 0116 03/16/21 0633 03/16/21 0920 03/16/21 1349  BP: (!) 168/114 (!) 174/109 (!) 196/129 (!) 160/108  Pulse: 77 72 88 86  Resp:  18 18   Temp:  98.9 F (37.2 C) 98.2 F (36.8 C) 98.4 F (  36.9 C)  TempSrc:  Oral Oral Oral  SpO2:  99% 100% 100%  Weight:      Height:        Intake/Output Summary (Last 24 hours) at 03/16/2021 1451 Last data filed at 03/16/2021 1000 Gross per 24 hour  Intake 1092.2 ml  Output 0 ml  Net 1092.2 ml   Filed Weights    03/16/21 0058  Weight: 54.9 kg    Examination:   General: Not in pain or dyspnea  Neurology: Awake and alert, non focal  E ENT: mild pallor, no icterus, oral mucosa moist Cardiovascular: No JVD. S1-S2 present, rhythmic, no gallops, rubs, or murmurs. No lower extremity edema. Pulmonary: vesicular breath sounds bilaterally, adequate air movement, no wheezing, rhonchi or rales. Gastrointestinal. Abdomen soft but tender to palpation Skin. Genital area with edema but not erythema no open wound with discharge.  Musculoskeletal: no joint deformities     Data Reviewed: I have personally reviewed following labs and imaging studies  CBC: Recent Labs  Lab 03/12/21 2130 03/14/21 0130 03/15/21 1637  WBC 10.9* 10.4 8.2  NEUTROABS 8.1* 8.3*  --   HGB 10.2* 9.3* 9.8*  HCT 31.1* 28.7* 30.5*  MCV 67.0* 66.7* 67.5*  PLT 438* 434* 242*   Basic Metabolic Panel: Recent Labs  Lab 03/12/21 2130 03/14/21 0130 03/15/21 1637  NA 131* 133* 133*  K 3.2* 3.7 4.0  CL 95* 98 98  CO2 27 28 28   GLUCOSE 96 94 93  BUN 20 17 12   CREATININE 0.84 0.72 0.71  CALCIUM 9.0 8.9 9.1   GFR: Estimated Creatinine Clearance: 75.3 mL/min (by C-G formula based on SCr of 0.71 mg/dL). Liver Function Tests: Recent Labs  Lab 03/12/21 2130 03/14/21 0130 03/15/21 1637  AST 13* 12* 14*  ALT 11 9 10   ALKPHOS 92 82 81  BILITOT 0.4 0.4 0.3  PROT 7.3 6.6 7.2  ALBUMIN 3.4* 3.0* 3.3*   Recent Labs  Lab 03/12/21 2130 03/14/21 0130 03/15/21 1637  LIPASE 32 34 42   No results for input(s): AMMONIA in the last 168 hours. Coagulation Profile: No results for input(s): INR, PROTIME in the last 168 hours. Cardiac Enzymes: No results for input(s): CKTOTAL, CKMB, CKMBINDEX, TROPONINI in the last 168 hours. BNP (last 3 results) No results for input(s): PROBNP in the last 8760 hours. HbA1C: No results for input(s): HGBA1C in the last 72 hours. CBG: No results for input(s): GLUCAP in the last 168 hours. Lipid  Profile: No results for input(s): CHOL, HDL, LDLCALC, TRIG, CHOLHDL, LDLDIRECT in the last 72 hours. Thyroid Function Tests: No results for input(s): TSH, T4TOTAL, FREET4, T3FREE, THYROIDAB in the last 72 hours. Anemia Panel: No results for input(s): VITAMINB12, FOLATE, FERRITIN, TIBC, IRON, RETICCTPCT in the last 72 hours.    Radiology Studies: I have reviewed all of the imaging during this hospital visit personally     Scheduled Meds:  amLODipine  5 mg Oral Daily   doxycycline  100 mg Oral Q12H   enoxaparin (LOVENOX) injection  40 mg Subcutaneous Q24H   [START ON 03/17/2021] influenza vac split quadrivalent PF  0.5 mL Intramuscular Tomorrow-1000   pantoprazole  40 mg Oral Daily   sodium chloride flush  3 mL Intravenous Q12H   Continuous Infusions:  cefTRIAXone (ROCEPHIN)  IV     metronidazole 500 mg (03/16/21 1129)     LOS: 0 days        Anabell Swint Gerome Apley, MD

## 2021-03-16 NOTE — Progress Notes (Signed)
NP Blount was paged because patient is still  10/10 pain and PRNs given without any relief. NP Blount per message stated she can't give patient more than what she already has.

## 2021-03-16 NOTE — Progress Notes (Signed)
PHARMACIST - PHYSICIAN COMMUNICATION  CONCERNING: Antibiotic IV to Oral Route Change Policy  RECOMMENDATION: This patient is receiving doxycycline by the intravenous route.  Based on criteria approved by the Pharmacy and Therapeutics Committee, the antibiotic(s) is/are being converted to the equivalent oral dose form(s).   DESCRIPTION: These criteria include:  Patient being treated for a respiratory tract infection, urinary tract infection, cellulitis or clostridium difficile associated diarrhea if on metronidazole  The patient is not neutropenic and does not exhibit a GI malabsorption state  The patient is eating (either orally or via tube) and/or has been taking other orally administered medications for a least 24 hours  The patient is improving clinically and has a Tmax < 100.5  If you have questions about this conversion, please contact the Pharmacy Department  []  ( [phone] )  [place] []  ( [phone]  [facility] []  ( [phone]  [place] []  ( [phone]  [facility] [x]  ( [phone] )  [facility]  

## 2021-03-17 DIAGNOSIS — N730 Acute parametritis and pelvic cellulitis: Secondary | ICD-10-CM | POA: Diagnosis not present

## 2021-03-17 DIAGNOSIS — K2971 Gastritis, unspecified, with bleeding: Secondary | ICD-10-CM | POA: Diagnosis not present

## 2021-03-17 DIAGNOSIS — I10 Essential (primary) hypertension: Secondary | ICD-10-CM | POA: Diagnosis not present

## 2021-03-17 DIAGNOSIS — N739 Female pelvic inflammatory disease, unspecified: Secondary | ICD-10-CM | POA: Diagnosis not present

## 2021-03-17 LAB — CBC
HCT: 34.2 % — ABNORMAL LOW (ref 36.0–46.0)
Hemoglobin: 11.1 g/dL — ABNORMAL LOW (ref 12.0–15.0)
MCH: 21.9 pg — ABNORMAL LOW (ref 26.0–34.0)
MCHC: 32.5 g/dL (ref 30.0–36.0)
MCV: 67.6 fL — ABNORMAL LOW (ref 80.0–100.0)
Platelets: 431 10*3/uL — ABNORMAL HIGH (ref 150–400)
RBC: 5.06 MIL/uL (ref 3.87–5.11)
RDW: 20.5 % — ABNORMAL HIGH (ref 11.5–15.5)
WBC: 6 10*3/uL (ref 4.0–10.5)
nRBC: 0 % (ref 0.0–0.2)

## 2021-03-17 LAB — BASIC METABOLIC PANEL
Anion gap: 9 (ref 5–15)
BUN: 13 mg/dL (ref 6–20)
CO2: 27 mmol/L (ref 22–32)
Calcium: 9 mg/dL (ref 8.9–10.3)
Chloride: 98 mmol/L (ref 98–111)
Creatinine, Ser: 0.71 mg/dL (ref 0.44–1.00)
GFR, Estimated: >60 mL/min (ref >60)
Glucose, Bld: 81 mg/dL (ref 70–99)
Potassium: 3.9 mmol/L (ref 3.5–5.1)
Sodium: 134 mmol/L — ABNORMAL LOW (ref 135–145)

## 2021-03-17 LAB — URINE CULTURE: Culture: NO GROWTH

## 2021-03-17 MED ORDER — ONDANSETRON HCL 4 MG/2ML IJ SOLN
4.0000 mg | Freq: Four times a day (QID) | INTRAMUSCULAR | Status: DC | PRN
  Administered 2021-03-17 – 2021-03-18 (×4): 4 mg via INTRAVENOUS
  Filled 2021-03-17 (×4): qty 2

## 2021-03-17 MED ORDER — CYCLOBENZAPRINE HCL 5 MG PO TABS
5.0000 mg | ORAL_TABLET | Freq: Three times a day (TID) | ORAL | Status: DC | PRN
  Administered 2021-03-17: 5 mg via ORAL
  Filled 2021-03-17: qty 1

## 2021-03-17 MED ORDER — HYDROMORPHONE HCL 1 MG/ML IJ SOLN: 0.5000 mg | INTRAMUSCULAR | Status: DC | PRN

## 2021-03-17 MED ORDER — METRONIDAZOLE 500 MG PO TABS
500.0000 mg | ORAL_TABLET | Freq: Two times a day (BID) | ORAL | Status: DC
  Administered 2021-03-17 – 2021-03-18 (×3): 500 mg via ORAL
  Filled 2021-03-17 (×3): qty 1

## 2021-03-17 MED ORDER — LISINOPRIL 10 MG PO TABS
10.0000 mg | ORAL_TABLET | Freq: Every day | ORAL | Status: DC
  Administered 2021-03-17 – 2021-03-18 (×2): 10 mg via ORAL
  Filled 2021-03-17 (×2): qty 1

## 2021-03-17 MED ORDER — HYDROMORPHONE HCL 1 MG/ML IJ SOLN
1.0000 mg | INTRAMUSCULAR | Status: DC | PRN
  Administered 2021-03-17 – 2021-03-18 (×4): 1 mg via INTRAVENOUS
  Filled 2021-03-17 (×4): qty 1

## 2021-03-17 NOTE — Progress Notes (Signed)
PROGRESS NOTE    Terri [name]  WLN:[id number] DOB: 1976/01/19 DOA: 03/15/2021 PCP: Merryl Hacker, No    Brief Narrative:  Mrs. Overbey was admitted to the hospital with the working diagnosis of pelvic inflammatory disease/urine tract infection.   45 year old female past medical history for  substance abuse, anxiety, depression, bipolar, gastrointestinal bleed, status post gastric bypass surgery who presented with vaginal and lower abdominal pain.  Reported persistent lower abdominal pain, associated with nausea and vaginal swelling.  She did not allow to have a pelvic exam.  She had recent diagnosis of urine tract infection/ pelvic inflammatory disease.  She was prescribed metronidazole and doxycycline.  Because of persistent symptoms she came back to the hospital.  Positive vaginal discharge, dysuria and vaginal discomfort.  On her initial physical examination her blood pressure was 211/130, heart rate 71, respiratory rate 17, oxymetry saturation 99%.  Her lungs were clear to auscultation bilaterally, heart S1-S2, present, rhythmic, the abdomen was tender to palpation mainly in the suprapubic region, positive guarding, no lower extremity edema.   Sodium 133, potassium 3.7, chloride 98, bicarb 28, glucose 94, BUN 17, creatinine 0.72, white count 10.4, hemoglobin 9.3, hematocrit 28.7, platelets 434. SARS COVID-19 was negative.   Urinalysis specific gravity 1.025, 6-10 red cells, 21-50 white cells.   CT of the pelvis showing several tiny foci of gas along the subcutaneous soft tissues of the bilateral labia majora.  Gas could be within false external soft tissues but it is unlikely given distribution and coronal imaging.  Necrotizing fasciitis cannot be excluded.  No acute intra-abdominal intrapelvic abnormality.   EKG 91 bpm, normal axis, normal intervals, sinus rhythm, no significant ST segment or T wave changes, positive LVH.   She was placed on broad-spectrum antibiotic therapy including ceftriaxone,  metronidazole and doxycycline.  Continue to have abdominal pain.   Assessment & Plan:   Principal Problem:   PID (pelvic inflammatory disease) Active Problems:   Essential hypertension   GIB (gastrointestinal bleeding)   S/P gastric bypass   Polysubstance abuse ([place])   PID (acute pelvic inflammatory disease)   Pelvic inflammatory disease, urine infection.  Continue to have pain, improved with analgesics but not back to baseline.  Patient with no leukocytosis and afebrile.    Plan to continue with oral antibiotic therapy with metronidazole and doxycycline.  Discontinue ceftriaxone.  If patient had improvement in her pain, possible dc home tomorrow.  She will need assistance with her medications at discharge.  Add as needed cyclobenzaprine for muscle spasms.  Pain control with hydromorphone 0,5 mg as needed every 4 hrs.    2. Uncontrolled HTN. Urgency.  Blood pressure continue to be elevated, will continue amlodipine and HZTC, add lisinopril 10 mg daily Continue blood pressure monitoring.    3. Substance abuse.  No acute  signs of withdrawal. Nicotine patch    4. Anemia (chronic) history of GI bleeding.  Hgb is 11,1 with hct at 34,1.  No signs of bleeding.   Patient continue to be at high risk for worsening PID  Status is: Inpatient  Remains inpatient appropriate because: pain control and antibiotic therapy    DVT prophylaxis: Enoxaparin   Code Status:    full  Family Communication:   No family at the bedside      Antimicrobials:  Metronidazole and doxycycline     Subjective: Patient continue to have pain lower abdomen, no nausea or vomiting, no chest pain or dyspnea.   Objective: Vitals:   03/16/21 1700 03/16/21 2105 03/17/21  0530 03/17/21 1336  BP: (!) 160/90 (!) 157/110 (!) 151/115 (!) 168/115  Pulse: 85 72 76 91  Resp:  18 18 16   Temp: 97.9 F (36.6 C) 97.9 F (36.6 C) 98.1 F (36.7 C) 99 F (37.2 C)  TempSrc: Oral Oral Oral Oral  SpO2:  99%  99% 100%  Weight:      Height:        Intake/Output Summary (Last 24 hours) at 03/17/2021 1607 Last data filed at 03/17/2021 1523 Gross per 24 hour  Intake 1760 ml  Output 0 ml  Net 1760 ml   Filed Weights   03/16/21 0058  Weight: 54.9 kg    Examination:   General: Not in pain or dyspnea, deconditioned  Neurology: Awake and alert, non focal  E ENT: no pallor, no icterus, oral mucosa moist Cardiovascular: No JVD. S1-S2 present, rhythmic, no gallops, rubs, or murmurs. No lower extremity edema. Pulmonary: vesicular breath sounds bilaterally, adequate air movement, no wheezing, rhonchi or rales. Gastrointestinal. Abdomen soft and non tender Skin. No rashes Musculoskeletal: no joint deformities     Data Reviewed: I have personally reviewed following labs and imaging studies  CBC: Recent Labs  Lab 03/12/21 2130 03/14/21 0130 03/15/21 1637 03/17/21 0447  WBC 10.9* 10.4 8.2 6.0  NEUTROABS 8.1* 8.3*  --   --   HGB 10.2* 9.3* 9.8* 11.1*  HCT 31.1* 28.7* 30.5* 34.2*  MCV 67.0* 66.7* 67.5* 67.6*  PLT 438* 434* 451* 761*   Basic Metabolic Panel: Recent Labs  Lab 03/12/21 2130 03/14/21 0130 03/15/21 1637 03/17/21 0447  NA 131* 133* 133* 134*  K 3.2* 3.7 4.0 3.9  CL 95* 98 98 98  CO2 27 28 28 27   GLUCOSE 96 94 93 81  BUN 20 17 12 13   CREATININE 0.84 0.72 0.71 0.71  CALCIUM 9.0 8.9 9.1 9.0   GFR: Estimated Creatinine Clearance: 75.3 mL/min (by C-G formula based on SCr of 0.71 mg/dL). Liver Function Tests: Recent Labs  Lab 03/12/21 2130 03/14/21 0130 03/15/21 1637  AST 13* 12* 14*  ALT 11 9 10   ALKPHOS 92 82 81  BILITOT 0.4 0.4 0.3  PROT 7.3 6.6 7.2  ALBUMIN 3.4* 3.0* 3.3*   Recent Labs  Lab 03/12/21 2130 03/14/21 0130 03/15/21 1637  LIPASE 32 34 42   No results for input(s): AMMONIA in the last 168 hours. Coagulation Profile: No results for input(s): INR, PROTIME in the last 168 hours. Cardiac Enzymes: No results for input(s): CKTOTAL, CKMB,  CKMBINDEX, TROPONINI in the last 168 hours. BNP (last 3 results) No results for input(s): PROBNP in the last 8760 hours. HbA1C: No results for input(s): HGBA1C in the last 72 hours. CBG: No results for input(s): GLUCAP in the last 168 hours. Lipid Profile: No results for input(s): CHOL, HDL, LDLCALC, TRIG, CHOLHDL, LDLDIRECT in the last 72 hours. Thyroid Function Tests: No results for input(s): TSH, T4TOTAL, FREET4, T3FREE, THYROIDAB in the last 72 hours. Anemia Panel: No results for input(s): VITAMINB12, FOLATE, FERRITIN, TIBC, IRON, RETICCTPCT in the last 72 hours.    Radiology Studies: I have reviewed all of the imaging during this hospital visit personally     Scheduled Meds:  amLODipine  10 mg Oral Daily   doxycycline  100 mg Oral Q12H   enoxaparin (LOVENOX) injection  40 mg Subcutaneous Q24H   hydrochlorothiazide  25 mg Oral Daily   influenza vac split quadrivalent PF  0.5 mL Intramuscular Tomorrow-1000   metroNIDAZOLE  500 mg Oral BID  nicotine  14 mg Transdermal Daily   pantoprazole  40 mg Oral Daily   sodium chloride flush  3 mL Intravenous Q12H   Continuous Infusions:  cefTRIAXone (ROCEPHIN)  IV 1 g (03/16/21 2104)     LOS: 1 day        Neda Willenbring Gerome Apley, MD

## 2021-03-17 NOTE — Progress Notes (Signed)
PHARMACIST - PHYSICIAN COMMUNICATION  CONCERNING: Antibiotic IV to Oral Route Change Policy  RECOMMENDATION: This patient is receiving metronidazole by the intravenous route.  Based on criteria approved by the Pharmacy and Therapeutics Committee, the antibiotic(s) is/are being converted to the equivalent oral dose form(s).   DESCRIPTION: These criteria include: Patient being treated for a respiratory tract infection, urinary tract infection, cellulitis or clostridium difficile associated diarrhea if on metronidazole The patient is not neutropenic and does not exhibit a GI malabsorption state The patient is eating (either orally or via tube) and/or has been taking other orally administered medications for a least 24 hours The patient is improving clinically and has a Tmax < 100.5  If you have questions about this conversion, please contact the Pharmacy Department  []   [phone] )  [facility] []   [phone] )  [facility] []   [phone] )  Terri [name] []   [phone] )  [facility] [x]   [phone] )  [facility]

## 2021-03-18 ENCOUNTER — Encounter (HOSPITAL_COMMUNITY): Payer: Self-pay | Admitting: Internal Medicine

## 2021-03-18 DIAGNOSIS — I160 Hypertensive urgency: Secondary | ICD-10-CM

## 2021-03-18 DIAGNOSIS — D500 Iron deficiency anemia secondary to blood loss (chronic): Secondary | ICD-10-CM

## 2021-03-18 DIAGNOSIS — D509 Iron deficiency anemia, unspecified: Secondary | ICD-10-CM | POA: Diagnosis present

## 2021-03-18 DIAGNOSIS — K289 Gastrojejunal ulcer, unspecified as acute or chronic, without hemorrhage or perforation: Secondary | ICD-10-CM

## 2021-03-18 DIAGNOSIS — I10 Essential (primary) hypertension: Secondary | ICD-10-CM | POA: Diagnosis not present

## 2021-03-18 DIAGNOSIS — E871 Hypo-osmolality and hyponatremia: Secondary | ICD-10-CM | POA: Diagnosis present

## 2021-03-18 DIAGNOSIS — K922 Gastrointestinal hemorrhage, unspecified: Secondary | ICD-10-CM | POA: Diagnosis not present

## 2021-03-18 DIAGNOSIS — R1084 Generalized abdominal pain: Secondary | ICD-10-CM

## 2021-03-18 LAB — CBC
HCT: 33.3 % — ABNORMAL LOW (ref 36.0–46.0)
Hemoglobin: 10.8 g/dL — ABNORMAL LOW (ref 12.0–15.0)
MCH: 21.6 pg — ABNORMAL LOW (ref 26.0–34.0)
MCHC: 32.4 g/dL (ref 30.0–36.0)
MCV: 66.6 fL — ABNORMAL LOW (ref 80.0–100.0)
Platelets: 468 10*3/uL — ABNORMAL HIGH (ref 150–400)
RBC: 5 MIL/uL (ref 3.87–5.11)
RDW: 20.2 % — ABNORMAL HIGH (ref 11.5–15.5)
WBC: 7.5 10*3/uL (ref 4.0–10.5)
nRBC: 0 % (ref 0.0–0.2)

## 2021-03-18 MED ORDER — ACETAMINOPHEN 500 MG PO TABS
1000.0000 mg | ORAL_TABLET | Freq: Three times a day (TID) | ORAL | Status: DC
  Administered 2021-03-18 (×2): 1000 mg via ORAL
  Filled 2021-03-18 (×2): qty 2

## 2021-03-18 MED ORDER — LISINOPRIL 10 MG PO TABS
10.0000 mg | ORAL_TABLET | Freq: Every day | ORAL | 3 refills | Status: DC
  Filled 2021-03-18: qty 30, 30d supply, fill #0

## 2021-03-18 MED ORDER — HYDROCHLOROTHIAZIDE 25 MG PO TABS
25.0000 mg | ORAL_TABLET | Freq: Every day | ORAL | 3 refills | Status: DC
  Filled 2021-03-18: qty 30, 30d supply, fill #0

## 2021-03-18 MED ORDER — SUCRALFATE 1 GM/10ML PO SUSP
1.0000 g | Freq: Three times a day (TID) | ORAL | 0 refills | Status: DC
  Filled 2021-03-18: qty 420, 11d supply, fill #0

## 2021-03-18 MED ORDER — OXYCODONE HCL 5 MG PO TABS
5.0000 mg | ORAL_TABLET | Freq: Four times a day (QID) | ORAL | Status: DC | PRN
  Administered 2021-03-18 (×2): 5 mg via ORAL
  Filled 2021-03-18 (×2): qty 1

## 2021-03-18 MED ORDER — AMLODIPINE BESYLATE 10 MG PO TABS
10.0000 mg | ORAL_TABLET | Freq: Every day | ORAL | 3 refills | Status: DC
  Filled 2021-03-18: qty 30, 30d supply, fill #0

## 2021-03-18 MED ORDER — PANTOPRAZOLE SODIUM 40 MG PO TBEC
40.0000 mg | DELAYED_RELEASE_TABLET | Freq: Every day | ORAL | 3 refills | Status: DC
  Filled 2021-03-18: qty 30, 30d supply, fill #0

## 2021-03-18 NOTE — Hospital Course (Addendum)
45 y.o F with hx polysubstance abuse, anxiety, depression, bipolar, gastrointestinal bleed, status post gastric bypass surgery, recently left AMA after admission for UTI who presented with vaginal and lower abdominal pain.    Reported persistent lower abdominal pain, associated with nausea and vaginal swelling.  She did not allow to have a pelvic exam.   In the ER, BP was 211/130, no fever.  Abdomen diffusely tender.  CT abdomen unremarkable, maybe some labial swelling or air?   She was placed on broad-spectrum antibiotic therapy including ceftriaxone, metronidazole and doxycycline.

## 2021-03-18 NOTE — Assessment & Plan Note (Signed)
Discharged with lisinopril, HCTZ and amlodipine to [facility]

## 2021-03-18 NOTE — Discharge Summary (Signed)
Physician Discharge Summary   Patient name: Terri [name]  Admit date:     03/15/2021  Discharge date: 03/18/2021  Discharge Physician: Edwin Dada   PCP: Pcp, No   Recommendations at discharge:  Follow up with [facility] Please obtain CBC in 1 month Please titrate BP meds Please follow up to confirm GC/chlamydia and syphilis results were communicated to patient       Discharge Diagnoses Principal Problem:   Marginal ulcer Active Problems:   Essential hypertension   Hypertensive urgency   Polysubstance abuse (HCC)   Hyponatremia   Iron deficiency anemia due to chronic blood loss   S/P gastric bypass      Hospital Course   45 y.o F with hx polysubstance abuse, anxiety, depression, bipolar, gastrointestinal bleed, status post gastric bypass surgery, recently left AMA after admission for UTI who presented with vaginal and lower abdominal pain.    Reported persistent lower abdominal pain, associated with nausea and vaginal swelling.  She did not allow to have a pelvic exam.   In the ER, BP was 211/130, no fever.  Abdomen diffusely tender.  CT abdomen unremarkable, maybe some labial swelling or air?   She was placed on broad-spectrum antibiotic therapy including ceftriaxone, metronidazole and doxycycline.     * Marginal ulcer OB consulted.  PID ruled out.  Labia normal.  Pelvic exam normal.  Antibiotics stopped.  GC/chlamydia and syphilis testing sent.  Recent HIV negative.    Discharged with PCP appointment arranged and new prescriptions for pantoprazole and sucralfate to subsidized clinic  Hypertensive urgency    Essential hypertension Discharged with lisinopril, HCTZ and amlodipine to subsidzied clinic  Polysubstance abuse ([facility])    Hyponatremia    Iron deficiency anemia due to chronic blood loss Due to gastric bypass marginal ulcer.  No melena or active bleeding  S/P gastric bypass          Condition at discharge:  good  Exam Physical Exam Constitutional:      Comments: undernourished  Eyes:     General: No scleral icterus.    Extraocular Movements: Extraocular movements intact.  Cardiovascular:     Rate and Rhythm: Normal rate and regular rhythm.     Heart sounds: No murmur heard.   No gallop.  Pulmonary:     Effort: Pulmonary effort is normal.     Breath sounds: No wheezing or rales.  Abdominal:     General: Abdomen is flat. Bowel sounds are normal. There is no distension.     Palpations: There is no mass.     Tenderness: There is abdominal tenderness (diffusely). There is guarding.  Skin:    General: Skin is warm and dry.  Neurological:     Mental Status: She is alert and oriented to person, place, and time.     Motor: No weakness.  Psychiatric:        Mood and Affect: Mood normal.        Behavior: Behavior normal.      Disposition: Home  Discharge time: greater than 30 minutes.  Follow-up [place] [facility] Follow up on 04/06/2021.   Why: Your appointment is scheduled for Monday April 06, 2021 at 2:30pm with Dr. Wynetta Emery. Contact information: [place] [address] [phone]        Chancy Milroy, MD. Call.   Specialty: Obstetrics and Gynecology Why: As needed Contact information: [address]  [place] [address] [phone]                 Allergies as of 03/18/2021       Reactions   Ketorolac Tromethamine Itching, Nausea And Vomiting   Methocarbamol Diarrhea        Medication List     STOP taking these medications    acetaminophen 500 MG tablet Commonly known as: TYLENOL   doxycycline 100 MG capsule Commonly known as: VIBRAMYCIN   metroNIDAZOLE 500 MG tablet Commonly known as: FLAGYL       TAKE these medications    amLODipine 10 MG tablet Commonly known as: NORVASC Take 1 tablet (10 mg total) by mouth daily. Start taking on: March 19, 2021   hydrochlorothiazide 25 MG tablet Commonly known as: HYDRODIURIL Take 1 tablet (25 mg total) by mouth daily. Start taking on: March 19, 2021   lisinopril 10 MG tablet Commonly known as: ZESTRIL Take 1 tablet (10 mg total) by mouth daily. Start taking on: March 19, 2021   pantoprazole 40 MG tablet Commonly known as: PROTONIX Take 1 tablet (40 mg total) by mouth daily.   sucralfate 1 GM/10ML suspension Commonly known as: Carafate Take 10 mLs (1 g total) by mouth 4 (four) times daily -  with meals and at bedtime.        CT Chest W Contrast  Result Date: 02/26/2021 CLINICAL DATA:  45 year old female with increasing upper abdominal pain. EXAM: CT CHEST, ABDOMEN, AND PELVIS WITH CONTRAST TECHNIQUE: Multidetector CT imaging of the chest, abdomen and pelvis was performed following the standard protocol during bolus administration of intravenous contrast. CONTRAST:  67mL OMNIPAQUE IOHEXOL 350 MG/ML SOLN COMPARISON:  CT Abdomen and Pelvis 01/17/2021 and earlier. Chest radiograph 0556 hours today. Neck CT 09/25/2017. FINDINGS: CT CHEST FINDINGS Cardiovascular: Calcified aortic atherosclerosis. No cardiomegaly or pericardial effusion. Calcified coronary artery atherosclerosis is widespread (series 2, image 30). The central pulmonary arteries are enhanced and appear to be patent. Mediastinum/Nodes: Negative. No mediastinal mass or lymphadenopathy. Lungs/Pleura: Major airways are patent with mild bilateral bronchial wall thickening. Occasional subpleural lung scarring (anterior right upper lobe series 5, image 42). Patchy dependent opacity in the costophrenic angles more resembles atelectasis than infection. There is questionable early centrilobular emphysema in the upper lobes. No pleural effusion or other abnormal pulmonary opacity. Musculoskeletal: No acute or suspicious osseous lesion in the chest. CT ABDOMEN PELVIS FINDINGS Hepatobiliary: Chronically absent gallbladder. Stable liver  with mild postoperative bile duct enlargement. Pancreas: Stable pancreatic atrophy. Spleen: Stable mild splenomegaly. Adrenals/Urinary Tract: New heterogeneous enhancement of the left kidney (series 12, image 64) typical for acute pyelonephritis. No left hydronephrosis. Right renal enhancement appears stable and within normal limits. Adrenal glands remain normal. On the delayed images there is fairly symmetric contrast excretion from both kidneys. No discrete pararenal fluid collection. There is mild urothelial thickening at the left renal pelvis. Unremarkable bladder. Stomach/Bowel: Generalized mesenteric edema throughout the abdomen and pelvis. No dilated loops. 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 No discrete bowel inflammation or free air identified. Evidence of a normal gas containing appendix on coronal image 86. Vascular/Lymphatic: Advanced Aortoiliac calcified atherosclerosis. Major arterial structures remain patent. Portal venous system is inadequately opacified but the main portal vein does appear to be patent. No lymphadenopathy identified. Reproductive: Negative.  Numerous pelvic phleboliths. Other: New pelvic free fluid, moderate volume with simple fluid density (series 2, image 114). Generalized body wall edema has not significantly changed  since August. Musculoskeletal: Age advanced lumbar spine degeneration with previous posterior lumbar decompression. Advanced chronic disc and endplate degeneration from L3 to the sacrum. No acute osseous abnormality identified. IMPRESSION: 1. Acute Left Pyelonephritis. No associated renal abscess or other complicating features. 2. New moderate volume pelvic free fluid since August. This is nonspecific, but perhaps related to anasarca given the appearance of continued body wall edema and new generalized mesenteric congestion since August. 3. No discrete bowel inflammation is evident. Previous Roux-en-Y type  gastric bypass with no bowel obstruction. 4. No acute or inflammatory process identified in the chest. No pleural effusion. 5. Advanced calcified coronary artery and Aortic Atherosclerosis (ICD10-I70.0). Electronically Signed   By: Genevie Ann M.D.   On: 02/26/2021 07:08   CT ABDOMEN PELVIS W CONTRAST  Result Date: 03/15/2021 CLINICAL DATA:  Nonlocalized acute abdominal pain. Abdomen pain lower cramping Pelvic inflammatory disease and STD per patient. EXAM: CT ABDOMEN AND PELVIS WITH CONTRAST TECHNIQUE: Multidetector CT imaging of the abdomen and pelvis was performed using the standard protocol following bolus administration of intravenous contrast. CONTRAST:  42mL OMNIPAQUE IOHEXOL 350 MG/ML SOLN COMPARISON:  CT abdomen pelvis 02/26/2021 FINDINGS: Lower chest: Bibasilar linear atelectasis versus scarring. Hepatobiliary: Indeterminate oval 1.1 cm right hepatic lobe hypodensity (2:22). Status post cholecystectomy. No biliary dilatation. Pancreas: No focal lesion. Normal pancreatic contour. No surrounding inflammatory changes. No main pancreatic ductal dilatation. Spleen: Normal in size without focal abnormality. Adrenals/Urinary Tract: No adrenal nodule bilaterally. Bilateral kidneys enhance symmetrically. No hydronephrosis. No hydroureter. The urinary bladder is unremarkable. On delayed imaging, there is no urothelial wall thickening and there are no filling defects in the opacified portions of the bilateral collecting systems or ureters. Stomach/Bowel: Gastric Roux-en-Y surgical bypass surgical changes. Stomach is within normal limits. No evidence of bowel wall thickening or dilatation. Stool throughout the colon. The appendix not definitely identified. Vascular/Lymphatic: No abdominal aorta or iliac aneurysm. Severe atherosclerotic plaque of the aorta and its branches. No abdominal, pelvic, or inguinal lymphadenopathy. Reproductive: Markedly limited evaluation of the pelvis due to lack of intraperitoneal fat.  Uterus and bilateral adnexa are unremarkable. Other: No intraperitoneal free fluid. No intraperitoneal free gas. No organized fluid collection. Musculoskeletal: Several tiny foci of gas along the subcutaneus soft tissues of the bilateral labia majora. No suspicious lytic or blastic osseous lesions. No acute displaced fracture. Multilevel degenerative changes of the spine. IMPRESSION: 1. Several tiny foci of gas along the subcutaneus soft tissues of the bilateral labia majora. Gas could be within folds external to the soft tissues but this is unlikely given the distribution on coronal imaging. No organized fluid collection. Necrotizing fasciitis cannot be excluded as this is a clinical diagnosis. Recommend correlation with physical exam. 2. No acute intra-abdominal or intrapelvic abnormality with slightly limited evaluation due to lack of intraperitoneal fat. Given history, please consider pelvic ultrasound if clinically indicated. 3. Stool throughout the colon. Electronically Signed   By: Iven Finn M.D.   On: 03/15/2021 18:00   CT Abdomen Pelvis W Contrast  Result Date: 02/26/2021 CLINICAL DATA:  45 year old female with increasing upper abdominal pain. EXAM: CT CHEST, ABDOMEN, AND PELVIS WITH CONTRAST TECHNIQUE: Multidetector CT imaging of the chest, abdomen and pelvis was performed following the standard protocol during bolus administration of intravenous contrast. CONTRAST:  66mL OMNIPAQUE IOHEXOL 350 MG/ML SOLN COMPARISON:  CT Abdomen and Pelvis 01/17/2021 and earlier. Chest radiograph 0556 hours today. Neck CT 09/25/2017. FINDINGS: CT CHEST FINDINGS Cardiovascular: Calcified aortic atherosclerosis. No cardiomegaly or pericardial effusion. Calcified coronary  artery atherosclerosis is widespread (series 2, image 30). The central pulmonary arteries are enhanced and appear to be patent. Mediastinum/Nodes: Negative. No mediastinal mass or lymphadenopathy. Lungs/Pleura: Major airways are patent with mild  bilateral bronchial wall thickening. Occasional subpleural lung scarring (anterior right upper lobe series 5, image 42). Patchy dependent opacity in the costophrenic angles more resembles atelectasis than infection. There is questionable early centrilobular emphysema in the upper lobes. No pleural effusion or other abnormal pulmonary opacity. Musculoskeletal: No acute or suspicious osseous lesion in the chest. CT ABDOMEN PELVIS FINDINGS Hepatobiliary: Chronically absent gallbladder. Stable liver with mild postoperative bile duct enlargement. Pancreas: Stable pancreatic atrophy. Spleen: Stable mild splenomegaly. Adrenals/Urinary Tract: New heterogeneous enhancement of the left kidney (series 12, image 64) typical for acute pyelonephritis. No left hydronephrosis. Right renal enhancement appears stable and within normal limits. Adrenal glands remain normal. On the delayed images there is fairly symmetric contrast excretion from both kidneys. No discrete pararenal fluid collection. There is mild urothelial thickening at the left renal pelvis. Unremarkable bladder. Stomach/Bowel: Generalized mesenteric edema throughout the abdomen and pelvis. No dilated loops. 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 No discrete bowel inflammation or free air identified. Evidence of a normal gas containing appendix on coronal image 86. Vascular/Lymphatic: Advanced Aortoiliac calcified atherosclerosis. Major arterial structures remain patent. Portal venous system is inadequately opacified but the main portal vein does appear to be patent. No lymphadenopathy identified. Reproductive: Negative.  Numerous pelvic phleboliths. Other: New pelvic free fluid, moderate volume with simple fluid density (series 2, image 114). Generalized body wall edema has not significantly changed since August. Musculoskeletal: Age advanced lumbar spine degeneration with previous posterior lumbar decompression.  Advanced chronic disc and endplate degeneration from L3 to the sacrum. No acute osseous abnormality identified. IMPRESSION: 1. Acute Left Pyelonephritis. No associated renal abscess or other complicating features. 2. New moderate volume pelvic free fluid since August. This is nonspecific, but perhaps related to anasarca given the appearance of continued body wall edema and new generalized mesenteric congestion since August. 3. No discrete bowel inflammation is evident. 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 4. No acute or inflammatory process identified in the chest. No pleural effusion. 5. Advanced calcified coronary artery and Aortic Atherosclerosis (ICD10-I70.0). Electronically Signed   By: Genevie Ann M.D.   On: 02/26/2021 07:08   DG Chest Port 1 View  Result Date: 03/12/2021 CLINICAL DATA:  Chest pain EXAM: PORTABLE CHEST 1 VIEW COMPARISON:  02/26/2021 FINDINGS: The heart size and mediastinal contours are within normal limits. Both lungs are clear. The visualized skeletal structures are unremarkable. IMPRESSION: Negative. Electronically Signed   By: Rolm Baptise M.D.   On: 03/12/2021 22:09   DG Chest Port 1 View  Result Date: 02/26/2021 CLINICAL DATA:  45 year old female with history of fever and worsening upper abdominal pain. EXAM: PORTABLE CHEST 1 VIEW COMPARISON:  Chest x-ray 09/17/2020. FINDINGS: Skin fold artifacts are noted bilaterally. Lung volumes are normal. No consolidative airspace disease. No pleural effusions. No pneumothorax. No pulmonary nodule or mass noted. Pulmonary vasculature and the cardiomediastinal silhouette are within normal limits. IMPRESSION: 1.  No radiographic evidence of acute cardiopulmonary disease. Electronically Signed   By: Vinnie Langton M.D.   On: 02/26/2021 06:13   CT Renal Stone Study  Result Date: 03/12/2021 CLINICAL DATA:  Flank pain, kidney stone suspected left flank pain. EXAM: CT ABDOMEN AND PELVIS WITHOUT CONTRAST TECHNIQUE:  Multidetector CT imaging of the abdomen and pelvis  was performed following the standard protocol without IV contrast. COMPARISON:  02/26/2021 FINDINGS: Lower chest: No acute abnormality. Hepatobiliary: Prior cholecystectomy.  No focal hepatic abnormality. Pancreas: No focal abnormality or ductal dilatation. Spleen: No focal abnormality.  Normal size. Adrenals/Urinary Tract: No adrenal abnormality. No focal renal abnormality. No stones or hydronephrosis. Urinary bladder is unremarkable. 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 Moderate stool throughout the colon. No evidence of bowel obstruction. Vascular/Lymphatic: Heavily calcified aorta and iliac vessels. No evidence of aneurysm or adenopathy. Reproductive: Uterus and adnexa unremarkable.  No mass. Other: No free fluid or free air. Musculoskeletal: No acute bony abnormality. Degenerative changes in the lumbar spine. IMPRESSION: No renal or ureteral stones.  No hydronephrosis. Heavily calcified aorta and iliac vessels. Moderate stool burden in the colon No acute findings in the abdomen or pelvis. Electronically Signed   By: Rolm Baptise M.D.   On: 03/12/2021 22:43   Results for orders placed or performed during the hospital encounter of 03/15/21  Urine Culture     Status: None   Collection Time: 03/15/21  8:44 PM   Specimen: Urine, Clean Catch  Result Value Ref Range Status   Specimen Description   Final    URINE, CLEAN CATCH Performed at [facility] [address] [place], [place] [address]    Special Requests   Final    NONE Performed at [facility], [place] [address], [place], [place] [address]    Culture   Final    NO GROWTH Performed at [facility], [place] [address] [place], [place] [address]    Report Status 03/17/2021 FINAL  Final  Blood culture (routine x 2)     Status: None (Preliminary result)   Collection Time: 03/15/21  8:44 PM   Specimen: BLOOD  Result Value Ref  Range Status   Specimen Description   Final    BLOOD LEFT WRIST Performed at [facility] [address], [place], [place] [address]    Special Requests   Final    BOTTLES DRAWN AEROBIC AND ANAEROBIC Blood Culture adequate volume Performed at [facility] [address] [place], [place] [address]    Culture   Final    NO GROWTH 3 DAYS Performed at [facility], [place] [address], [place],  [address]    Report Status PENDING  Incomplete  Resp Panel by RT-PCR (Flu A&B, Covid) Nasopharyngeal Swab     Status: None   Collection Time: 03/15/21 11:47 PM   Specimen: Nasopharyngeal Swab; Nasopharyngeal(NP) swabs in vial transport medium  Result Value Ref Range Status   SARS Coronavirus 2 by RT PCR NEGATIVE NEGATIVE Final    Comment: (NOTE) SARS-CoV-2 target nucleic acids are NOT DETECTED.  The SARS-CoV-2 RNA is generally detectable in upper respiratory specimens during the acute phase of infection. The lowest concentration of SARS-CoV-2 viral copies this assay can detect is 138 copies/mL. A negative result does not preclude SARS-Cov-2 infection and should not be used as the sole basis for treatment or other patient management decisions. A negative result may occur with  improper specimen collection/handling, submission of specimen other than nasopharyngeal swab, presence of viral mutation(s) within the areas targeted by this assay, and inadequate number of viral copies(<138 copies/mL). A negative result must be combined with clinical observations, patient history, and epidemiological information. The expected result is Negative.  Fact Sheet for Patients:  EntrepreneurPulse.com.au  Fact Sheet for Healthcare Providers:  IncredibleEmployment.be  This  test is no t yet approved or cleared by the [place] and  has been authorized for detection and/or diagnosis of SARS-CoV-2 by FDA under an  Emergency Use Authorization (EUA). This EUA will remain  in effect (meaning this test can be used) for the duration of the COVID-19 declaration under Section 564(b)(1) of the Act, 21 U.S.C.section 360bbb-3(b)(1), unless the authorization is terminated  or revoked sooner.       Influenza A by PCR NEGATIVE NEGATIVE Final   Influenza B by PCR NEGATIVE NEGATIVE Final    Comment: (NOTE) The Xpert Xpress SARS-CoV-2/FLU/RSV plus assay is intended as an aid in the diagnosis of influenza from Nasopharyngeal swab specimens and should not be used as a sole basis for treatment. Nasal washings and aspirates are unacceptable for Xpert Xpress SARS-CoV-2/FLU/RSV testing.  Fact Sheet for Patients: EntrepreneurPulse.com.au  Fact Sheet for Healthcare Providers: IncredibleEmployment.be  This test is not yet approved or cleared by the [place] FDA and has been authorized for detection and/or diagnosis of SARS-CoV-2 by FDA under an Emergency Use Authorization (EUA). This EUA will remain in effect (meaning this test can be used) for the duration of the COVID-19 declaration under Section 564(b)(1) of the Act, 21 U.S.C. section 360bbb-3(b)(1), unless the authorization is terminated or revoked.  Performed at [facility], [place] [address], [place], [place] [address]     Signed:  Edwin Dada MD.  [facility] 03/18/2021, 8:27 PM

## 2021-03-18 NOTE — Assessment & Plan Note (Signed)
Due to gastric bypass marginal ulcer.  No melena or active bleeding

## 2021-03-18 NOTE — Progress Notes (Signed)
Patient was given discharge instructions, and all questions were answered.  Patient was stable for discharge and was taken to the main exit by wheelchair. 

## 2021-03-18 NOTE — Consult Note (Signed)
Reason for Consult:? PID Referring Physician: Theodora Lalanne is an 45 y.o. female was admitted to the hospital service with a presumption Dx of PID based on trich noted in microscopic UA on 03/12/21. She has a complicated medial history as outlined in her medical records.  She has been treated with IV antibiotics appropriately but continues to have abd pain.\Pt describes the pain as starting the epigastric region and radiating to her lower pelvis and back.   LMP was several years ago. Last intercourse was 2 weeks ago. Last pap smear and mammogram unknown. Denies any prior history of STD  H/O BTL  C/S x 2 EAB x 1   Menstrual History: Menarche age: 68 No LMP recorded. Patient is perimenopausal.    Past Medical History:  Diagnosis Date   Arthritis    Back and legs   Bipolar disorder (HCC)    Chronic back pain    Depression    DVT (deep venous thrombosis) (HCC)    early 20's leg   GIB (gastrointestinal bleeding) 09/13/2017   History of blood transfusion    Hypertension    Migraine headache    Neuropathy    Pneumonia    Positive PPD    x 2 last time 09/2017   Sciatica    Stress incontinence     Past Surgical History:  Procedure Laterality Date   ALVEOLOPLASTY Bilateral 01/31/2018   Procedure: ALVEOLOPLASTY;  Surgeon: Diona Browner, DDS;  Location: [place];  Service: Oral Surgery;  Laterality: Bilateral;   ANKLE SURGERY Left 1992   with hardware   BACK SURGERY     BIOPSY  02/08/2019   Procedure: BIOPSY;  Surgeon: Rush Landmark Telford Nab., MD;  Location: [facility];  Service: Gastroenterology;;   CESAREAN SECTION     CHOLECYSTECTOMY N/A 02/15/2017   Procedure: LAPAROSCOPIC CHOLECYSTECTOMY;  Surgeon: Clovis Riley, MD;  Location: [facility];  Service: General;  Laterality: N/A;   ENTEROSCOPY N/A 02/08/2019   Procedure: ENTEROSCOPY;  Surgeon: Rush Landmark Telford Nab., MD;  Location: [facility];  Service: Gastroenterology;  Laterality: N/A;    ESOPHAGOGASTRODUODENOSCOPY N/A 01/17/2021   Procedure: ESOPHAGOGASTRODUODENOSCOPY (EGD);  Surgeon: Juanita Craver, MD;  Location: [facility];  Service: Endoscopy;  Laterality: N/A;   ESOPHAGOGASTRODUODENOSCOPY (EGD) WITH PROPOFOL N/A 09/15/2017   Procedure: ESOPHAGOGASTRODUODENOSCOPY (EGD) WITH PROPOFOL;  Surgeon: Clarene Essex, MD;  [facility];  Service: Endoscopy;  Laterality: N/A;   ESOPHAGOGASTRODUODENOSCOPY (EGD) WITH PROPOFOL N/A 02/08/2019   Procedure: ESOPHAGOGASTRODUODENOSCOPY (EGD) WITH PROPOFOL;  Surgeon: Rush Landmark Telford Nab., MD;  Location[facility];  Service: Gastroenterology;  Laterality: N/A;   GASTRIC BYPASS     INCISION AND DRAINAGE OF PERITONSILLAR ABCESS Left 08/13/2017   Procedure: INCISION AND DRAINAGE OF LEFT NECK ABSCESS;  Surgeon: Melida Quitter, MD;  Location: [facility];  Service: ENT;  Laterality: Left;   LAPAROSCOPIC LYSIS OF ADHESIONS  02/15/2017   Procedure: LAPAROSCOPIC LYSIS OF ADHESIONS;  Surgeon: Clovis Riley, MD;  Location[facility];  Service: General;;   LUMBAR DISC SURGERY     LUMBAR FUSION     SAVORY DILATION N/A 02/08/2019   Procedure: SAVORY DILATION;  Surgeon: Irving Copas., MD;  [facility];  Service: Gastroenterology;  Laterality: N/A;   TOOTH EXTRACTION Bilateral 01/31/2018   Procedure: DENTAL RESTORATION/EXTRACTIONS;  Surgeon: Diona Browner, DDS;  Location: [place];  Service: Oral Surgery;  Laterality: Bilateral;   TUBAL LIGATION     UPPER GI ENDOSCOPY N/A 02/15/2017   Procedure: UPPER GI ENDOSCOPY;  Surgeon: Clovis Riley, MD;  Location: [facility];  Service: General;  Laterality: N/A;    Family History  Problem Relation Age of Onset   Diabetes Mother    Hypertension Mother    Diabetes Father    Hypertension Father     Social History:  reports that she has been smoking cigarettes. She has a 29.00 pack-year smoking history. She has never used smokeless tobacco. She reports current drug use. Drugs: IV and Cocaine. She  reports that she does not drink alcohol.  Allergies:  Allergies  Allergen Reactions   Ketorolac Tromethamine Itching and Nausea And Vomiting   Methocarbamol Diarrhea    Medications: I have reviewed the patient's current medications.  Review of Systems  Constitutional:  Positive for activity change and appetite change.  Gastrointestinal:  Positive for abdominal pain.  Genitourinary:  Positive for flank pain.   Blood pressure (!) 123/96, pulse 95, temperature 97.8 F (36.6 C), temperature source Oral, resp. rate 15, height 5\' 9"  (1.753 m), weight 54.9 kg, SpO2 100 %. Physical Exam Cardiovascular:     Rate and Rhythm: Normal rate and regular rhythm.  Abdominal:     General: Abdomen is flat. Bowel sounds are normal.     Palpations: Abdomen is soft.     Tenderness: There is generalized abdominal tenderness. There is right CVA tenderness and left CVA tenderness.  Genitourinary:    Vagina: Normal.     Uterus: Normal.      Adnexa: Right adnexa normal.  Neurological:     Mental Status: She is alert.    Results for orders placed or performed during the hospital encounter of 03/15/21 (from the past 48 hour(s))  CBC     Status: Abnormal   Collection Time: 03/17/21  4:47 AM  Result Value Ref Range   WBC 6.0 4.0 - 10.5 K/uL   RBC 5.06 3.87 - 5.11 MIL/uL   Hemoglobin 11.1 (L) 12.0 - 15.0 g/dL   HCT 34.2 (L) 36.0 - 46.0 %   MCV 67.6 (L) 80.0 - 100.0 fL   MCH 21.9 (L) 26.0 - 34.0 pg   MCHC 32.5 30.0 - 36.0 g/dL   RDW 20.5 (H) 11.5 - 15.5 %   Platelets 431 (H) 150 - 400 K/uL   nRBC 0.0 0.0 - 0.2 %    Comment: Performed at [facility], [place] [address] [place], [place] [address]  Basic metabolic panel     Status: Abnormal   Collection Time: 03/17/21  4:47 AM  Result Value Ref Range   Sodium 134 (L) 135 - 145 mmol/L   Potassium 3.9 3.5 - 5.1 mmol/L   Chloride 98 98 - 111 mmol/L   CO2 27 22 - 32 mmol/L   Glucose, Bld 81 70 - 99 mg/dL    Comment: Glucose reference  range applies only to samples taken after fasting for at least 8 hours.   BUN 13 6 - 20 mg/dL   Creatinine, Ser 0.71 0.44 - 1.00 mg/dL   Calcium 9.0 8.9 - 10.3 mg/dL   GFR, Estimated >60 >60 mL/min    Comment: (NOTE) Calculated using the CKD-EPI Creatinine Equation (2021)    Anion gap 9 5 - 15    Comment: Performed at [facility], [place] [address] [place], [place] [address]    No results found.  Assessment/Plan: Abd pain  Low supesion for PID due to H/O BTL and exam. Pt has been appropriately treated. CT scan normal as well. Vaginal culture  obtained today. Can complete out pt treatment for PID if desires. Will send note to office for follow up appt for routine health maintance.   On behalf of [facility] [employer], I want to thank Dr Loleta Books allowing [place] to participate in the care of Ms Dorough. Please contact us if you have any further concerns at [phone] M-F, 8 A 5 P and [phone] all other times. Please do not share these numbers with family or pt.   Chancy Milroy 03/18/2021

## 2021-03-18 NOTE — TOC Progression Note (Signed)
Transition of Care [facility]) - Progression Note   Patient Details  Name: Terri [name] MRN: [id number] Date of Birth: 1975-11-30  Transition of Care [facility]) CM/SW [place], LCSW Phone Number: 03/18/2021, 2:18 PM  Clinical Narrative: Patient will need PCP follow up. CSW scheduled patient at [facility] for Monday April 06, 2021 at 2:30pm with Dr. Wynetta Emery. CSW updated patient. Appointment added to AVS.  Barriers to Discharge: Continued Medical Work up, Other (must enter comment) (Patient is homeless and living in her car.)  Expected Discharge Plan and Services In-house Referral: Clinical Social Work Post Acute Care Choice: NA            DME Arranged: N/A DME Agency: NA  Readmission Risk Interventions No flowsheet data found.

## 2021-03-18 NOTE — Assessment & Plan Note (Signed)
OB consulted.  PID ruled out.  Labia normal.  Pelvic exam normal.  Antibiotics stopped.  GC/chlamydia and syphilis testing sent.  Recent HIV negative.    Discharged with PCP appointment arranged and new prescriptions for pantoprazole and sucralfate to subsidized clinic

## 2021-03-19 ENCOUNTER — Other Ambulatory Visit: Payer: Self-pay

## 2021-03-19 LAB — GC/CHLAMYDIA PROBE AMP (~~LOC~~) NOT AT ARMC
Chlamydia: NEGATIVE
Comment: NEGATIVE
Comment: NORMAL
Neisseria Gonorrhea: NEGATIVE

## 2021-03-19 LAB — RPR: RPR Ser Ql: NONREACTIVE

## 2021-03-20 LAB — CULTURE, BLOOD (ROUTINE X 2)
Culture: NO GROWTH
Special Requests: ADEQUATE

## 2021-03-26 ENCOUNTER — Other Ambulatory Visit: Payer: Self-pay

## 2021-03-30 ENCOUNTER — Other Ambulatory Visit: Payer: Self-pay

## 2021-03-30 ENCOUNTER — Emergency Department (HOSPITAL_COMMUNITY): Payer: Medicaid Other

## 2021-03-30 ENCOUNTER — Encounter (HOSPITAL_COMMUNITY): Payer: Self-pay | Admitting: Emergency Medicine

## 2021-03-30 DIAGNOSIS — R079 Chest pain, unspecified: Secondary | ICD-10-CM | POA: Diagnosis present

## 2021-03-30 DIAGNOSIS — Z5321 Procedure and treatment not carried out due to patient leaving prior to being seen by health care provider: Secondary | ICD-10-CM | POA: Diagnosis not present

## 2021-03-30 DIAGNOSIS — R0602 Shortness of breath: Secondary | ICD-10-CM | POA: Insufficient documentation

## 2021-03-30 DIAGNOSIS — R55 Syncope and collapse: Secondary | ICD-10-CM | POA: Diagnosis not present

## 2021-03-30 DIAGNOSIS — M545 Low back pain, unspecified: Secondary | ICD-10-CM | POA: Diagnosis not present

## 2021-03-30 NOTE — ED Provider Notes (Signed)
Emergency Medicine Provider Triage Evaluation Note  Terri [name] , a 45 y.o. female  was evaluated in triage.  Pt complains of chest pain and shortness of breath.  She states that she has had coughing since last night.  States that she is coughing up green sputum.  She states this morning she began having sharp, nonradiating chest pain with shortness of breath.  She does endorse some pain in her mid back.  She endorses fever today with body aches.  She denies any nausea or vomiting, sore throat.  Review of Systems  Positive: Chest pain, shortness of breath, cough Negative: Sore throat, nausea or vomiting  Physical Exam  BP (!) 176/130 (BP Location: Right Arm)   Pulse 85   Temp 99.1 F (37.3 C) (Oral)   Resp 18   SpO2 96%  Gen:   Awake, no distress   Resp:  Normal effort, coarse lung sounds on the right, clear to auscultation on the left. MSK:   Moves extremities without difficulty  Other:  S1-S2 without murmur.  Pulses 2+ and equal bilaterally.  Lower extremities without edema.  Oropharynx is clear without erythema.  Medical Decision Making  Medically screening exam initiated at 7:18 PM.  Appropriate orders placed.  [name] was informed that the remainder of the evaluation will be completed by another provider, this initial triage assessment does not replace that evaluation, and the importance of remaining in the ED until their evaluation is complete.     [name], PA-C 03/30/21 Dorthula Perfect    Godfrey Pick, MD 03/31/21 [phone]

## 2021-03-30 NOTE — ED Triage Notes (Signed)
Pt reports chest pain, SHOB, back pain and syncope episodes all that began today. Pt denies N/V

## 2021-03-31 ENCOUNTER — Emergency Department (HOSPITAL_COMMUNITY)
Admission: EM | Admit: 2021-03-31 | Discharge: 2021-03-31 | Disposition: A | Discharge status: Home / Self Care | Payer: Medicaid Other | Attending: Emergency Medicine | Admitting: Emergency Medicine

## 2021-03-31 LAB — CBC
HCT: 32.2 % — ABNORMAL LOW (ref 36.0–46.0)
Hemoglobin: 10.6 g/dL — ABNORMAL LOW (ref 12.0–15.0)
MCH: 21.8 pg — ABNORMAL LOW (ref 26.0–34.0)
MCHC: 32.9 g/dL (ref 30.0–36.0)
MCV: 66.3 fL — ABNORMAL LOW (ref 80.0–100.0)
Platelets: 377 10*3/uL (ref 150–400)
RBC: 4.86 MIL/uL (ref 3.87–5.11)
RDW: 20.4 % — ABNORMAL HIGH (ref 11.5–15.5)
WBC: 9.9 10*3/uL (ref 4.0–10.5)
nRBC: 0 % (ref 0.0–0.2)

## 2021-03-31 LAB — BASIC METABOLIC PANEL
Anion gap: 9 (ref 5–15)
BUN: 7 mg/dL (ref 6–20)
CO2: 27 mmol/L (ref 22–32)
Calcium: 8.7 mg/dL — ABNORMAL LOW (ref 8.9–10.3)
Chloride: 95 mmol/L — ABNORMAL LOW (ref 98–111)
Creatinine, Ser: 0.52 mg/dL (ref 0.44–1.00)
GFR, Estimated: >60 mL/min (ref >60)
Glucose, Bld: 127 mg/dL — ABNORMAL HIGH (ref 70–99)
Potassium: 2.6 mmol/L — CL (ref 3.5–5.1)
Sodium: 131 mmol/L — ABNORMAL LOW (ref 135–145)

## 2021-03-31 LAB — TROPONIN I (HIGH SENSITIVITY): Troponin I (High Sensitivity): 7 ng/L (ref <18)

## 2021-03-31 NOTE — ED Notes (Signed)
Pt refuse vitals update

## 2021-03-31 NOTE — ED Notes (Signed)
Patient refusing vitals, states "I'm not letting anyone do anything to me until I get a room." RN made aware.

## 2021-03-31 NOTE — ED Notes (Signed)
Pt called for rooming, no answer

## 2021-04-01 ENCOUNTER — Emergency Department (HOSPITAL_COMMUNITY): Payer: Medicaid Other

## 2021-04-01 ENCOUNTER — Inpatient Hospital Stay (HOSPITAL_COMMUNITY)
Admission: EM | Admit: 2021-04-01 | Discharge: 2021-04-06 | DRG: 202 | Disposition: A | Discharge status: Home / Self Care | Payer: Medicaid Other | Attending: Internal Medicine | Admitting: Internal Medicine

## 2021-04-01 ENCOUNTER — Other Ambulatory Visit: Payer: Self-pay

## 2021-04-01 DIAGNOSIS — Z86718 Personal history of other venous thrombosis and embolism: Secondary | ICD-10-CM

## 2021-04-01 DIAGNOSIS — F1123 Opioid dependence with withdrawal: Secondary | ICD-10-CM | POA: Diagnosis not present

## 2021-04-01 DIAGNOSIS — J4551 Severe persistent asthma with (acute) exacerbation: Secondary | ICD-10-CM | POA: Diagnosis not present

## 2021-04-01 DIAGNOSIS — G8929 Other chronic pain: Secondary | ICD-10-CM | POA: Diagnosis present

## 2021-04-01 DIAGNOSIS — D649 Anemia, unspecified: Secondary | ICD-10-CM | POA: Diagnosis present

## 2021-04-01 DIAGNOSIS — Z833 Family history of diabetes mellitus: Secondary | ICD-10-CM

## 2021-04-01 DIAGNOSIS — D509 Iron deficiency anemia, unspecified: Secondary | ICD-10-CM | POA: Diagnosis present

## 2021-04-01 DIAGNOSIS — I10 Essential (primary) hypertension: Secondary | ICD-10-CM | POA: Diagnosis present

## 2021-04-01 DIAGNOSIS — E538 Deficiency of other specified B group vitamins: Secondary | ICD-10-CM | POA: Diagnosis present

## 2021-04-01 DIAGNOSIS — G629 Polyneuropathy, unspecified: Secondary | ICD-10-CM | POA: Diagnosis present

## 2021-04-01 DIAGNOSIS — J4541 Moderate persistent asthma with (acute) exacerbation: Principal | ICD-10-CM | POA: Diagnosis present

## 2021-04-01 DIAGNOSIS — F17210 Nicotine dependence, cigarettes, uncomplicated: Secondary | ICD-10-CM | POA: Diagnosis present

## 2021-04-01 DIAGNOSIS — I160 Hypertensive urgency: Secondary | ICD-10-CM | POA: Diagnosis present

## 2021-04-01 DIAGNOSIS — Z886 Allergy status to analgesic agent status: Secondary | ICD-10-CM

## 2021-04-01 DIAGNOSIS — F319 Bipolar disorder, unspecified: Secondary | ICD-10-CM | POA: Diagnosis present

## 2021-04-01 DIAGNOSIS — Z9049 Acquired absence of other specified parts of digestive tract: Secondary | ICD-10-CM

## 2021-04-01 DIAGNOSIS — F1910 Other psychoactive substance abuse, uncomplicated: Secondary | ICD-10-CM | POA: Diagnosis present

## 2021-04-01 DIAGNOSIS — Z8249 Family history of ischemic heart disease and other diseases of the circulatory system: Secondary | ICD-10-CM

## 2021-04-01 DIAGNOSIS — M1990 Unspecified osteoarthritis, unspecified site: Secondary | ICD-10-CM | POA: Diagnosis present

## 2021-04-01 DIAGNOSIS — F1410 Cocaine abuse, uncomplicated: Secondary | ICD-10-CM | POA: Diagnosis present

## 2021-04-01 DIAGNOSIS — E876 Hypokalemia: Secondary | ICD-10-CM | POA: Diagnosis present

## 2021-04-01 DIAGNOSIS — Z888 Allergy status to other drugs, medicaments and biological substances status: Secondary | ICD-10-CM

## 2021-04-01 DIAGNOSIS — Z22322 Carrier or suspected carrier of Methicillin resistant Staphylococcus aureus: Secondary | ICD-10-CM

## 2021-04-01 DIAGNOSIS — Z716 Tobacco abuse counseling: Secondary | ICD-10-CM

## 2021-04-01 DIAGNOSIS — E43 Unspecified severe protein-calorie malnutrition: Secondary | ICD-10-CM | POA: Diagnosis present

## 2021-04-01 DIAGNOSIS — Z5900 Homelessness unspecified: Secondary | ICD-10-CM

## 2021-04-01 DIAGNOSIS — Z20822 Contact with and (suspected) exposure to covid-19: Secondary | ICD-10-CM | POA: Diagnosis present

## 2021-04-01 DIAGNOSIS — J45901 Unspecified asthma with (acute) exacerbation: Secondary | ICD-10-CM | POA: Diagnosis present

## 2021-04-01 DIAGNOSIS — Z981 Arthrodesis status: Secondary | ICD-10-CM

## 2021-04-01 DIAGNOSIS — R64 Cachexia: Secondary | ICD-10-CM | POA: Diagnosis present

## 2021-04-01 DIAGNOSIS — Z681 Body mass index (BMI) 19 or less, adult: Secondary | ICD-10-CM

## 2021-04-01 DIAGNOSIS — Z9884 Bariatric surgery status: Secondary | ICD-10-CM

## 2021-04-01 DIAGNOSIS — Z79899 Other long term (current) drug therapy: Secondary | ICD-10-CM

## 2021-04-01 DIAGNOSIS — D62 Acute posthemorrhagic anemia: Secondary | ICD-10-CM | POA: Diagnosis present

## 2021-04-01 DIAGNOSIS — K289 Gastrojejunal ulcer, unspecified as acute or chronic, without hemorrhage or perforation: Secondary | ICD-10-CM | POA: Diagnosis present

## 2021-04-01 LAB — RAPID URINE DRUG SCREEN, HOSP PERFORMED
Amphetamines: NOT DETECTED
Barbiturates: NOT DETECTED
Benzodiazepines: NOT DETECTED
Cocaine: POSITIVE — AB
Opiates: POSITIVE — AB
Tetrahydrocannabinol: NOT DETECTED

## 2021-04-01 LAB — BASIC METABOLIC PANEL
Anion gap: 12 (ref 5–15)
BUN: 13 mg/dL (ref 6–20)
CO2: 27 mmol/L (ref 22–32)
Calcium: 8.5 mg/dL — ABNORMAL LOW (ref 8.9–10.3)
Chloride: 96 mmol/L — ABNORMAL LOW (ref 98–111)
Creatinine, Ser: 0.97 mg/dL (ref 0.44–1.00)
GFR, Estimated: >60 mL/min (ref >60)
Glucose, Bld: 102 mg/dL — ABNORMAL HIGH (ref 70–99)
Potassium: 2.2 mmol/L — CL (ref 3.5–5.1)
Sodium: 135 mmol/L (ref 135–145)

## 2021-04-01 LAB — CBC WITH DIFFERENTIAL/PLATELET
Abs Immature Granulocytes: 0 10*3/uL (ref 0.00–0.07)
Basophils Absolute: 0 10*3/uL (ref 0.0–0.1)
Basophils Relative: 0 %
Eosinophils Absolute: 0 10*3/uL (ref 0.0–0.5)
Eosinophils Relative: 0 %
HCT: 28.2 % — ABNORMAL LOW (ref 36.0–46.0)
Hemoglobin: 9.2 g/dL — ABNORMAL LOW (ref 12.0–15.0)
Lymphocytes Relative: 21 %
Lymphs Abs: 1.5 10*3/uL (ref 0.7–4.0)
MCH: 21.6 pg — ABNORMAL LOW (ref 26.0–34.0)
MCHC: 32.6 g/dL (ref 30.0–36.0)
MCV: 66.4 fL — ABNORMAL LOW (ref 80.0–100.0)
Monocytes Absolute: 0.4 10*3/uL (ref 0.1–1.0)
Monocytes Relative: 5 %
Neutro Abs: 5.3 10*3/uL (ref 1.7–7.7)
Neutrophils Relative %: 74 %
Platelets: 327 10*3/uL (ref 150–400)
RBC: 4.25 MIL/uL (ref 3.87–5.11)
RDW: 19.8 % — ABNORMAL HIGH (ref 11.5–15.5)
WBC: 7.2 10*3/uL (ref 4.0–10.5)
nRBC: 0 % (ref 0.0–0.2)
nRBC: 0 /100 WBC

## 2021-04-01 LAB — URINALYSIS, ROUTINE W REFLEX MICROSCOPIC
Bilirubin Urine: NEGATIVE
Glucose, UA: NEGATIVE mg/dL
Hgb urine dipstick: NEGATIVE
Ketones, ur: NEGATIVE mg/dL
Leukocytes,Ua: NEGATIVE
Nitrite: NEGATIVE
Protein, ur: NEGATIVE mg/dL
Specific Gravity, Urine: 1.006 (ref 1.005–1.030)
pH: 6 (ref 5.0–8.0)

## 2021-04-01 LAB — MAGNESIUM: Magnesium: 1.8 mg/dL (ref 1.7–2.4)

## 2021-04-01 LAB — RESP PANEL BY RT-PCR (FLU A&B, COVID) ARPGX2
Influenza A by PCR: NEGATIVE
Influenza B by PCR: NEGATIVE
SARS Coronavirus 2 by RT PCR: NEGATIVE

## 2021-04-01 LAB — CBG MONITORING, ED: Glucose-Capillary: 96 mg/dL (ref 70–99)

## 2021-04-01 LAB — TROPONIN I (HIGH SENSITIVITY): Troponin I (High Sensitivity): 9 ng/L (ref <18)

## 2021-04-01 MED ORDER — IPRATROPIUM-ALBUTEROL 0.5-2.5 (3) MG/3ML IN SOLN
3.0000 mL | RESPIRATORY_TRACT | Status: AC
  Administered 2021-04-01 (×3): 3 mL via RESPIRATORY_TRACT
  Filled 2021-04-01: qty 6
  Filled 2021-04-01: qty 3

## 2021-04-01 MED ORDER — ACETAMINOPHEN 500 MG PO TABS
1000.0000 mg | ORAL_TABLET | Freq: Once | ORAL | Status: AC
  Administered 2021-04-01: 1000 mg via ORAL
  Filled 2021-04-01: qty 2

## 2021-04-01 MED ORDER — POTASSIUM CHLORIDE CRYS ER 20 MEQ PO TBCR
40.0000 meq | EXTENDED_RELEASE_TABLET | Freq: Once | ORAL | Status: DC
  Filled 2021-04-01: qty 2

## 2021-04-01 MED ORDER — POTASSIUM CHLORIDE 10 MEQ/100ML IV SOLN
10.0000 meq | INTRAVENOUS | Status: AC
  Administered 2021-04-01 – 2021-04-02 (×4): 10 meq via INTRAVENOUS
  Filled 2021-04-01 (×4): qty 100

## 2021-04-01 MED ORDER — METHYLPREDNISOLONE SODIUM SUCC 125 MG IJ SOLR
125.0000 mg | Freq: Once | INTRAMUSCULAR | Status: AC
  Administered 2021-04-01: 125 mg via INTRAVENOUS
  Filled 2021-04-01: qty 2

## 2021-04-01 MED ORDER — IBUPROFEN 800 MG PO TABS
800.0000 mg | ORAL_TABLET | Freq: Once | ORAL | Status: AC
  Administered 2021-04-01: 800 mg via ORAL
  Filled 2021-04-01: qty 1

## 2021-04-01 MED ORDER — BENZONATATE 100 MG PO CAPS
100.0000 mg | ORAL_CAPSULE | Freq: Once | ORAL | Status: AC
  Administered 2021-04-01: 100 mg via ORAL
  Filled 2021-04-01: qty 1

## 2021-04-01 MED ORDER — MAGNESIUM OXIDE -MG SUPPLEMENT 400 (240 MG) MG PO TABS
800.0000 mg | ORAL_TABLET | Freq: Once | ORAL | Status: AC
  Administered 2021-04-01: 800 mg via ORAL
  Filled 2021-04-01: qty 2

## 2021-04-01 NOTE — H&P (Signed)
Terri [name] SNK:[id number] DOB: [birth year] DOA: 04/01/2021     PCP: Merryl Hacker, No   Outpatient Specialists:  NONE    Patient arrived to ER on 04/01/21 at 1817 Referred by Attending Deno Etienne, DO   Patient coming from: home Lives  With  SO    Chief Complaint:   Chief Complaint  Patient presents with   Shortness of Breath    HPI: Terri [name] is a 45 y.o. female with medical history significant of asthma, polysubstance abuse    Presented with  wheezing shortness of breath, used cocaine 2 h before, reports for the past 3 days she have had continuous CP worse with coughing Denies fever She reports smoking but denies EtOH  Have refused PO meds Recently was admitted for UTI was placed on broad-spectrum antibiotic therapy including ceftriaxone, metronidazole and doxycycline. OB consulted.  PID ruled out.  Labia normal.  Pelvic exam normal.Antibiotics stopped.  GC/chlamydia and syphilis testing sent.  Recent HIV negative.  She was discharged home on with lisinopril, HCTZ and amlodipine to [facility]  Has  been vaccinated against COVID     Initial COVID TEST  NEGATIVE   Lab Results  Component Value Date   [place] 04/01/2021   [place] NEGATIVE 03/15/2021   [place] NEGATIVE 02/26/2021   [place] NEGATIVE 01/16/2021     Regarding pertinent Chronic problems:     Asthma -well   controlled on home inhalers                            Chronic anemia - baseline hg Hemoglobin & Hematocrit  Recent Labs    03/18/21 1241 03/31/21 0525 04/01/21 1921  HGB 10.8* 10.6* 9.2*     While in ER: After multiple Albuterol nebs K 2.2 Hg down to 9.2   ED Triage Vitals  Enc Vitals Group     BP 04/01/21 1835 (!) 160/127     Pulse Rate 04/01/21 1835 91     Resp 04/01/21 1835 20     Temp 04/01/21 1835 98.2 F (36.8 C)     Temp Source 04/01/21 1835 Oral     SpO2 04/01/21 1835 95 %     Weight 04/01/21 1834 115 lb (52.2 kg)     Height 04/01/21 1834 5'  9" (1.753 m)     Head Circumference --      Peak Flow --      Pain Score 04/01/21 1834 10     Pain Loc --      Pain Edu? --      Excl. in [place]? --   TMAX(24)@     _________________________________________ Significant initial  Findings: Abnormal Labs Reviewed  CBC WITH DIFFERENTIAL/PLATELET - Abnormal; Notable for the following components:      Result Value   Hemoglobin 9.2 (*)    HCT 28.2 (*)    MCV 66.4 (*)    MCH 21.6 (*)    RDW 19.8 (*)    All other components within normal limits  BASIC METABOLIC PANEL - Abnormal; Notable for the following components:   Potassium 2.2 (*)    Chloride 96 (*)    Glucose, Bld 102 (*)    Calcium 8.5 (*)    All other components within normal limits  RAPID URINE DRUG SCREEN, HOSP PERFORMED - Abnormal; Notable for the following components:   Opiates POSITIVE (*)    Cocaine POSITIVE (*)    All other components  within normal limits  IRON AND TIBC - Abnormal; Notable for the following components:   Iron 10 (*)    Saturation Ratios 3 (*)    All other components within normal limits  FERRITIN - Abnormal; Notable for the following components:   Ferritin 10 (*)    All other components within normal limits  RETICULOCYTES - Abnormal; Notable for the following components:   Immature Retic Fract 22.6 (*)    All other components within normal limits  CBC WITH DIFFERENTIAL/PLATELET - Abnormal; Notable for the following components:   Hemoglobin 9.7 (*)    HCT 29.8 (*)    MCV 67.1 (*)    MCH 21.8 (*)    RDW 19.8 (*)    Lymphs Abs 0.3 (*)    All other components within normal limits  COMPREHENSIVE METABOLIC PANEL - Abnormal; Notable for the following components:   Potassium 2.8 (*)    Glucose, Bld 111 (*)    Calcium 8.7 (*)    Total Protein 6.4 (*)    Albumin 3.0 (*)    All other components within normal limits   ____________________________________________ Ordered     CXR -  NON acute  _________________________ Troponin 9 ECG:  Ordered Personally reviewed by me showing: HR : 88 Rhythm:  NSR,   no evidence of ischemic changes QTC 449   The recent clinical data is shown below. Vitals:   04/01/21 2000 04/01/21 2030 04/01/21 2045 04/01/21 2100  BP: (!) 167/91 (!) 154/91 (!) 167/99   Pulse: 96 94 85 (!) 103  Resp: (!) 38 (!) 26 (!) 33 (!) 27  Temp:      TempSrc:      SpO2: 98% 93% 93% 95%  Weight:      Height:        WBC     Component Value Date/Time   WBC 7.2 04/01/2021 1921   LYMPHSABS 1.5 04/01/2021 1921   MONOABS 0.4 04/01/2021 1921   EOSABS 0.0 04/01/2021 1921   BASOSABS 0.0 04/01/2021 1921      UA  no evidence of UTI      Urine analysis:    Component Value Date/Time   COLORURINE YELLOW 04/01/2021 2150   APPEARANCEUR CLEAR 04/01/2021 2150   LABSPEC 1.006 04/01/2021 2150   PHURINE 6.0 04/01/2021 2150   GLUCOSEU NEGATIVE 04/01/2021 2150   HGBUR NEGATIVE 04/01/2021 2150   BILIRUBINUR NEGATIVE 04/01/2021 2150   KETONESUR NEGATIVE 04/01/2021 2150   PROTEINUR NEGATIVE 04/01/2021 2150   UROBILINOGEN 1.0 02/09/2015 1228   NITRITE NEGATIVE 04/01/2021 2150   LEUKOCYTESUR NEGATIVE 04/01/2021 2150    Results for orders placed or performed during the hospital encounter of 04/01/21  Resp Panel by RT-PCR (Flu A&B, Covid) Nasopharyngeal Swab     Status: None   Collection Time: 04/01/21  6:48 PM   Specimen: Nasopharyngeal Swab; Nasopharyngeal(NP) swabs in vial transport medium  Result Value Ref Range Status      NEGATIVE Final        Influenza A by PCR NEGATIVE NEGATIVE Final   Influenza B by PCR NEGATIVE NEGATIVE Final          _______________________________________________ Hospitalist was called for admission for Asthma exacerbation, tachypnea, hypokelamia  The following Work up has been ordered so far:  Orders Placed This Encounter  Procedures   Resp Panel by RT-PCR (Flu A&B, Covid) Nasopharyngeal Swab   DG Chest Port 1 View   CBC with Differential   Basic metabolic panel   Magnesium  Consult for [facility] Admission   CBG monitoring, ED   EKG 12-Lead     Following Medications were ordered in ER: Medications  potassium chloride 10 mEq in 100 mL IVPB (10 mEq Intravenous New Bag/Given 04/01/21 2143)  potassium chloride SA (KLOR-CON) CR tablet 40 mEq (has no administration in time range)  ipratropium-albuterol (DUONEB) 0.5-2.5 (3) MG/3ML nebulizer solution 3 mL (3 mLs Nebulization Given 04/01/21 1955)  acetaminophen (TYLENOL) tablet 1,000 mg (1,000 mg Oral Given 04/01/21 1915)  ibuprofen (ADVIL) tablet 800 mg (800 mg Oral Given 04/01/21 1915)  benzonatate (TESSALON) capsule 100 mg (100 mg Oral Given 04/01/21 1915)  methylPREDNISolone sodium succinate (SOLU-MEDROL) 125 mg/2 mL injection 125 mg (125 mg Intravenous Given 04/01/21 2140)     OTHER Significant initial  Findings:  labs showing:    Recent Labs  Lab 03/31/21 0525 04/01/21 1921 04/02/21 0115  NA 131* 135 135  K 2.6* 2.2* 2.8*  CO2 27 27 25   GLUCOSE 127* 102* 111*  BUN 7 13 13   CREATININE 0.52 0.97 0.95  CALCIUM 8.7* 8.5* 8.7*  MG  --  1.8 1.8  PHOS  --   --  3.0    Cr    stable,    Lab Results  Component Value Date   CREATININE 0.97 04/01/2021   CREATININE 0.52 03/31/2021   CREATININE 0.71 03/17/2021    Recent Labs  Lab 04/02/21 0115  AST 17  ALT 11  ALKPHOS 91  BILITOT 0.7  PROT 6.4*  ALBUMIN 3.0*   Lab Results  Component Value Date   CALCIUM 8.5 (L) 04/01/2021   PHOS 3.9 02/26/2021          Plt: Lab Results  Component Value Date   PLT 327 04/01/2021       COVID-19 Labs  Recent Labs    04/02/21 0115  FERRITIN 10*          Recent Labs  Lab 03/31/21 0525 04/01/21 1921  WBC 9.9 7.2  NEUTROABS  --  5.3  HGB 10.6* 9.2*  HCT 32.2* 28.2*  MCV 66.3* 66.4*  PLT 377 327    HG/HCT   stable     Component Value Date/Time   HGB 9.2 (L) 04/01/2021 1921   HCT 28.2 (L) 04/01/2021 1921   MCV 66.4 (L) 04/01/2021 1921      Cardiac Panel (last 3  results) Recent Labs    04/02/21 0055  CKTOTAL 50        CBG (last 3)  Recent Labs    04/01/21 1822  GLUCAP 96    Cultures:    Component Value Date/Time   SDES  03/15/2021 2044    URINE, CLEAN CATCH Performed at [facility], [place] [address] [place], [place] [address]    SDES  03/15/2021 2044    BLOOD LEFT WRIST Performed at [facility], [place] [address] [place], [place] [address]    SPECREQUEST  03/15/2021 2044    NONE Performed at [facility], [place] [address] [place],  [place] [address]    SPECREQUEST  03/15/2021 2044    BOTTLES DRAWN AEROBIC AND ANAEROBIC Blood Culture adequate volume Performed at [place] [address], [place], [place] [address]    CULT  03/15/2021 2044    NO GROWTH Performed at [place] [address] [place],  [address]    CULT  03/15/2021 2044    NO GROWTH 5 DAYS Performed at [facility], [address] [place], [place] [address]    REPTSTATUS 03/17/2021 FINAL 03/15/2021 2044   REPTSTATUS 03/20/2021 FINAL 03/15/2021 2044     Radiological Exams on Admission: DG Chest Port 1 View  Result Date: 04/01/2021 CLINICAL DATA:  Shortness of breath, cough x3 days EXAM: PORTABLE CHEST 1 VIEW COMPARISON:  03/30/2021 FINDINGS: Lungs are clear.  No pleural effusion or pneumothorax. The heart is normal in size. IMPRESSION: No evidence of acute cardiopulmonary disease. Electronically Signed   By: Julian Hy M.D.   On: 04/01/2021 19:07   _______________________________________________________________________________________________________ Latest  Blood pressure (!) 167/99, pulse (!) 103, temperature 98.2 F (36.8 C), temperature source Oral, resp. rate (!) 27, height 5\' 9"  (1.753 m), weight 52.2 kg, SpO2 95 %.   Review of Systems:    Pertinent positives include:  fatigue,  abdominal pain, chest pain shortness of breath at rest. URI  symptoms Constitutional:  No weight loss, night sweats, Fevers, chills, weight loss  HEENT:  No headaches, Difficulty swallowing,Tooth/dental problems,Sore throat,  No sneezing, itching, ear ache, nasal congestion, post nasal drip,  Cardio-vascular:  No, Orthopnea, PND, anasarca, dizziness, palpitations.no Bilateral lower extremity swelling  GI:  No heartburn, indigestion,  nausea, vomiting, diarrhea, change in bowel habits, loss of appetite, melena, blood in stool, hematemesis Resp:  no No dyspnea on exertion, No excess mucus, no productive cough, No non-productive cough, No coughing up of blood.No change in color of mucus.No wheezing. Skin:  no rash or lesions. No jaundice GU:  no dysuria, change in color of urine, no urgency or frequency. No straining to urinate.  No flank pain.  Musculoskeletal:  No joint pain or no joint swelling. No decreased range of motion. No back pain.  Psych:  No change in mood or affect. No depression or anxiety. No memory loss.  Neuro: no localizing neurological complaints, no tingling, no weakness, no double vision, no gait abnormality, no slurred speech, no confusion  All systems reviewed and apart from [place] all are negative _______________________________________________________________________________________________ Past Medical History:   Past Medical History:  Diagnosis Date   Arthritis    Back and legs   Bipolar disorder (HCC)    Chronic back pain    Depression    DVT (deep venous thrombosis) (HCC)    early 20's leg   GIB (gastrointestinal bleeding) 09/13/2017   History of blood transfusion    Hypertension    Migraine headache    Neuropathy    Pneumonia    Positive PPD    x 2 last time 09/2017   Sciatica    Stress incontinence       Past Surgical History:  Procedure Laterality Date   ALVEOLOPLASTY Bilateral 01/31/2018   Procedure: ALVEOLOPLASTY;  Surgeon: Diona Browner, DDS;  Location: [facility];  Service: Oral Surgery;  Laterality:  Bilateral;   ANKLE SURGERY Left 1992   with hardware   BACK SURGERY     BIOPSY  02/08/2019   Procedure: BIOPSY;  Surgeon: Rush Landmark Telford Nab., MD;  Location: [facility];  Service: Gastroenterology;;   CESAREAN SECTION     CHOLECYSTECTOMY N/A 02/15/2017   Procedure: LAPAROSCOPIC CHOLECYSTECTOMY;  Surgeon: Clovis Riley, MD;  [facility];  Service: General;  Laterality: N/A;   ENTEROSCOPY N/A 02/08/2019   Procedure: ENTEROSCOPY;  Surgeon: Rush Landmark Telford Nab., MD;  [facility];  Service: Gastroenterology;  Laterality: N/A;   ESOPHAGOGASTRODUODENOSCOPY N/A 01/17/2021   Procedure: ESOPHAGOGASTRODUODENOSCOPY (EGD);  Surgeon: Juanita Craver, MD;  Location[facility];  Service: Endoscopy;  Laterality: N/A;   ESOPHAGOGASTRODUODENOSCOPY (EGD) WITH PROPOFOL N/A 09/15/2017   Procedure: ESOPHAGOGASTRODUODENOSCOPY (EGD) WITH PROPOFOL;  Surgeon: Clarene Essex, MD;  [facility];  Service: Endoscopy;  Laterality: N/A;   ESOPHAGOGASTRODUODENOSCOPY (EGD) WITH PROPOFOL N/A 02/08/2019   Procedure: ESOPHAGOGASTRODUODENOSCOPY (EGD) WITH PROPOFOL;  Surgeon: Rush Landmark Telford Nab., MD;  [facility];  Service: Gastroenterology;  Laterality: N/A;   GASTRIC BYPASS     INCISION AND DRAINAGE OF PERITONSILLAR ABCESS Left 08/13/2017   Procedure: INCISION AND DRAINAGE OF LEFT NECK ABSCESS;  Surgeon: Melida Quitter, MD;  [facility];  Service: ENT;  Laterality: Left;   LAPAROSCOPIC LYSIS OF ADHESIONS  02/15/2017   Procedure: LAPAROSCOPIC LYSIS OF ADHESIONS;  Surgeon: Clovis Riley, MD;  [facility];  Service: General;;   LUMBAR DISC SURGERY     LUMBAR FUSION     SAVORY DILATION N/A 02/08/2019   Procedure: SAVORY DILATION;  Surgeon: Irving Copas., MD;  [facility];  Service: Gastroenterology;  Laterality: N/A;   TOOTH EXTRACTION Bilateral 01/31/2018   Procedure: DENTAL RESTORATION/EXTRACTIONS;  Surgeon: Diona Browner, DDS;  Location: [facility];   Service: Oral Surgery;  Laterality: Bilateral;   TUBAL LIGATION     UPPER GI ENDOSCOPY N/A 02/15/2017   Procedure: UPPER GI ENDOSCOPY;  Surgeon: Clovis Riley, MD;  [facility];  Service: General;  Laterality: N/A;    Social History:  Ambulatory   independently       reports that she has been smoking cigarettes. She has a 29.00 pack-year smoking history. She has never used smokeless tobacco. She reports current drug use. Drugs: IV and Cocaine. She reports that she does not drink alcohol.    Family History:   Family History  Problem Relation Age of Onset   Diabetes Mother    Hypertension Mother    Diabetes Father    Hypertension Father    ______________________________________________________________________________________________ Allergies: Allergies  Allergen Reactions   Ketorolac Tromethamine Itching and Nausea And Vomiting   Methocarbamol Diarrhea     Prior to Admission medications   Medication Sig Start Date End Date Taking? Authorizing Provider  amLODipine (NORVASC) 10 MG tablet Take 1 tablet (10 mg total) by mouth daily. 03/19/21   Danford, Suann Larry, MD  hydrochlorothiazide (HYDRODIURIL) 25 MG tablet Take 1 tablet (25 mg total) by mouth daily. 03/19/21   Danford, Suann Larry, MD  lisinopril (ZESTRIL) 10 MG tablet Take 1 tablet (10 mg total) by mouth daily. 03/19/21   Danford, Suann Larry, MD  pantoprazole (PROTONIX) 40 MG tablet Take 1 tablet (40 mg total) by mouth daily. 03/18/21   Danford, Suann Larry, MD  sucralfate (CARAFATE) 1 GM/10ML suspension Take 10 mLs (1 g total) by mouth 4 (four) times daily -  with meals and at bedtime. 03/18/21   Edwin Dada, MD    ___________________________________________________________________________________________________ Physical Exam: Vitals with BMI 04/01/2021 04/01/2021 04/01/2021  Height - - -  Weight - - -  BMI - - -  Systolic - 315 176  Diastolic - 99 91  Pulse 160 85 94     1.  General:  in No  Acute distress increased work of breathing  Chronically ill -appearing 2. Psychological: Alert and   Oriented 3. Head/ENT:     Dry Mucous Membranes                          Head Non traumatic, neck supple  Poor Dentition 4. SKIN:  decreased Skin turgor,  Skin clean Dry and intact no rash 5. Heart: Regular rate and rhythm no  Murmur, no Rub or gallop 6. Lungs:   some wheezes no  crackles   7. Abdomen: Soft,  non-tender, Non distended  bowel sounds present 8. Lower extremities: no clubbing, cyanosis, no  edema 9. Neurologically Grossly intact, moving all 4 extremities equally   10. MSK: Normal range of motion    Chart has been reviewed  ______________________________________________________________________________________________  Assessment/Plan 45 y.o. female with medical history significant of asthma, polysubstance abuse    Admitted for Asthma exacerbation in the setting of URI  Present on Admission:  Asthma exacerbation -  -  - Will initiate: Steroid taper  - Albuterol  PRN, - scheduled duoneb,  -  Breo or Dulera at discharge   -  Mucinex.  Titrate O2 to saturation >90%. Follow patients respiratory status.  influenza PCR neg Rep panel pend [place] CTA given CP and increased work of breathing with left shift Will also help assess fo r potential PNA    VBG no acute no evidence of hypercarbia  Currently mentating well no evidence of symptomatic hypercarbia   Polysubstance abuse ([place]) - ordered transition of care consult   Hypomagnesemia - mild replace   Hypokalemia - cont to replace pt refuses PO meds   Essential hypertension - on clonidine PRN   Cocaine abuse ([place]) -prior to discharge we will need to discuss importance of cocaine cessation   Anemia -appears to be iron deficiency anemia obtain Hemoccult stool. Patient endorses some mild epigastric discomfort but denies any black stools.  She says that she has history of stomach  ulcers. Had EGD done by Dr. Collene Mares in August with evidence of ulcer she is on PPI and Carafate Anemia has been fluctuating but stable for now No NSAIDS If hemoccult positive or worsening Anemia would need GI consult   Elevated lactic acid in the setting of inc work of breathing Will rehydrate and follow No evidence of sepsis at this time  Other plan as per orders.  DVT prophylaxis:  SCD      Code Status:    Code Status: Prior FULL CODE   as per patient   I had personally discussed CODE STATUS with patient      Family Communication:   Family not at  Bedside    Disposition Plan:   To home once workup is complete and patient is stable   Following barriers for discharge:                            Electrolytes corrected                               Anemia stable                              Pain controlled with PO medications                                                         Will need to be able to tolerate PO  Would benefit from PT/OT eval prior to DC  Ordered                                       Transition of care consulted                   Nutrition    consulted                                     Consults called: none  Admission status:  ED Disposition     ED Disposition  Admit   Condition  --   [place]: [place] [100100]  Level of Care: Progressive [102]  Admit to Progressive based on following criteria: RESPIRATORY PROBLEMS hypoxemic/hypercapnic respiratory failure that is responsive to NIPPV (BiPAP) or High Flow Nasal Cannula (6-80 lpm). Frequent assessment/intervention, no > Q2 hrs < Q4 hrs, to maintain oxygenation and pulmonary hygiene.  May place patient in observation at [facility] or [place] if equivalent level of care is available:: No  Covid Evaluation: Confirmed COVID Negative  Diagnosis: Asthma exacerbation [[id number]]  Admitting Physician: Toy Baker  [[id number]]  Attending Physician: Toy Baker [[id number]]           Obs     Level of care progresssive tele indefinitely please discontinue once patient no longer qualifies COVID-19 Labs    Lab Results  Component Value Date   [place] 04/01/2021     Precautions: admitted as   Covid Negative   PPE: Used by the provider:   N95  eye Goggles,  Gloves    Kortne All 04/02/2021, 3:24 AM    [facility]     after 2 AM please page floor coverage PA If 7AM-7PM, please contact the day team taking care of the patient using [email or web address]   Patient was evaluated in the context of the global COVID-19 pandemic, which necessitated consideration that the patient might be at risk for infection with the SARS-CoV-2 virus that causes COVID-19. Institutional protocols and algorithms that pertain to the evaluation of patients at risk for COVID-19 are in a state of rapid change based on information released by regulatory bodies including the CDC and federal and state organizations. These policies and algorithms were followed during the patient's care.

## 2021-04-01 NOTE — ED Provider Notes (Signed)
[facility] EMERGENCY DEPARTMENT Provider Note   CSN: [id number] Arrival date & time: 04/01/21  1817     History Chief Complaint  Patient presents with   Shortness of Breath    Terri [name] is a 45 y.o. female.  45 yo F with a chief complaints of shortness of breath.  This been going on for a couple days now.  Patient having cough and chest discomfort with it.  She was seen yesterday at the [facility] emergency departments and had an evaluation that was unremarkable and then was discharged home.  Was picked up by EMS tonight found to be tachypneic and was given breathing treatments without significant improvement in taken here for evaluation.  The history is provided by the patient.  Shortness of Breath Severity:  Moderate Onset quality:  Gradual Duration:  2 days Timing:  Constant Progression:  Worsening Chronicity:  New Relieved by:  Nothing Worsened by:  Nothing Ineffective treatments:  None tried Associated symptoms: cough   Associated symptoms: no chest pain, no fever, no headaches, no vomiting and no wheezing       Past Medical History:  Diagnosis Date   Arthritis    Back and legs   Bipolar disorder (HCC)    Chronic back pain    Depression    DVT (deep venous thrombosis) ([place])    early 20's leg   GIB (gastrointestinal bleeding) 09/13/2017   History of blood transfusion    Hypertension    Migraine headache    Neuropathy    Pneumonia    Positive PPD    x 2 last time 09/2017   Sciatica    Stress incontinence     Patient Active Problem List   Diagnosis Date Noted   Asthma exacerbation 04/01/2021   Anemia 04/01/2021   Iron deficiency anemia due to chronic blood loss 03/18/2021   Hyponatremia 03/18/2021   Marginal ulcer 03/15/2021   Microcytic anemia 02/26/2021   Mild protein malnutrition ([place]) 02/26/2021   Polysubstance abuse ([place]) 01/16/2021   Abdominal pain 01/14/2021   Ulcer at site of surgical anastomosis following bypass of  stomach 01/13/2021   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   Persistent fever 02/09/2019   COVID-19 virus not detected    Chest pain    Bacteremia due to Escherichia coli    Esophagitis    Hypokalemia    Hypomagnesemia    Sepsis ([place]) 02/02/2019   Leucocytosis 02/02/2019   Thrombocytosis 02/02/2019   UTI (urinary tract infection) 02/02/2019   Apnea 09/13/2017   Essential hypertension    Cellulitis and abscess of neck 08/12/2017   Cellulitis of submandibular region 08/10/2017   Odontogenic infection of jaw 08/10/2017   Cholecystitis 02/14/2017   Opioid abuse with opioid-induced mood disorder ([place]) 01/25/2017   Anxiety 02/11/2014   Depression 07/12/2012    Past Surgical History:  Procedure Laterality Date   ALVEOLOPLASTY Bilateral 01/31/2018   Procedure: ALVEOLOPLASTY;  Surgeon: Diona Browner, DDS;  Location: [place];  Service: Oral Surgery;  Laterality: Bilateral;   ANKLE SURGERY Left 1992   with hardware   BACK SURGERY  BIOPSY  02/08/2019   Procedure: BIOPSY;  Surgeon: Rush Landmark Telford Nab., MD;  Location: [facility];  Service: Gastroenterology;;   CESAREAN SECTION     CHOLECYSTECTOMY N/A 02/15/2017   Procedure: LAPAROSCOPIC CHOLECYSTECTOMY;  Surgeon: Clovis Riley, MD;  Location: [facility];  Service: General;  Laterality: N/A;   ENTEROSCOPY N/A 02/08/2019   Procedure: ENTEROSCOPY;  Surgeon: Rush Landmark Telford Nab., MD;  [facility];  Service: Gastroenterology;  Laterality: N/A;   ESOPHAGOGASTRODUODENOSCOPY N/A 01/17/2021   Procedure: ESOPHAGOGASTRODUODENOSCOPY (EGD);  Surgeon: Juanita Craver, MD;  Location[facility];  Service: Endoscopy;  Laterality: N/A;   ESOPHAGOGASTRODUODENOSCOPY (EGD) WITH PROPOFOL N/A 09/15/2017   Procedure: ESOPHAGOGASTRODUODENOSCOPY (EGD) WITH PROPOFOL;  Surgeon: Clarene Essex, MD;  [facility];  Service: Endoscopy;  Laterality: N/A;   ESOPHAGOGASTRODUODENOSCOPY (EGD) WITH PROPOFOL N/A 02/08/2019   Procedure: ESOPHAGOGASTRODUODENOSCOPY (EGD) WITH PROPOFOL;  Surgeon: Rush Landmark Telford Nab., MD;  [facility];  Service: Gastroenterology;  Laterality: N/A;   GASTRIC BYPASS     INCISION AND DRAINAGE OF PERITONSILLAR ABCESS Left 08/13/2017   Procedure: INCISION AND DRAINAGE OF LEFT NECK ABSCESS;  Surgeon: Melida Quitter, MD;  [facility];  Service: ENT;  Laterality: Left;   LAPAROSCOPIC LYSIS OF ADHESIONS  02/15/2017   Procedure: LAPAROSCOPIC LYSIS OF ADHESIONS;  Surgeon: Clovis Riley, MD;  [facility];  Service: General;;   LUMBAR DISC SURGERY     LUMBAR FUSION     SAVORY DILATION N/A 02/08/2019   Procedure: SAVORY DILATION;  Surgeon: Irving Copas., MD;  [facility];  Service: Gastroenterology;  Laterality: N/A;   TOOTH EXTRACTION Bilateral 01/31/2018   Procedure: DENTAL RESTORATION/EXTRACTIONS;  Surgeon: Diona Browner, DDS;  Location: [place];  Service: Oral Surgery;  Laterality: Bilateral;   TUBAL LIGATION     UPPER GI ENDOSCOPY N/A 02/15/2017   Procedure: UPPER GI ENDOSCOPY;  Surgeon: Clovis Riley, MD;  [facility];  Service: General;  Laterality: N/A;     OB History   No obstetric history on file.     Family History  Problem Relation Age of Onset   Diabetes Mother    Hypertension Mother    Diabetes Father    Hypertension Father     Social History   Tobacco Use   Smoking status: Every Day    Packs/day: 1.00    Years: 29.00    Pack years: 29.00    Types: Cigarettes   Smokeless tobacco: Never  Vaping Use   Vaping Use: Never used  Substance Use Topics   Alcohol use: No    Drug use: Yes    Types: IV, Cocaine    Home Medications Prior to Admission medications   Medication Sig Start Date End Date Taking? Authorizing Provider  amLODipine (NORVASC) 10 MG tablet Take 1 tablet (10 mg total) by mouth daily. Patient not taking: Reported on 04/01/2021 03/19/21   Edwin Dada, MD  hydrochlorothiazide (HYDRODIURIL) 25 MG tablet Take 1 tablet (25 mg total) by mouth daily. Patient not taking: Reported on 04/01/2021 03/19/21   Edwin Dada, MD  lisinopril (ZESTRIL) 10 MG tablet Take 1 tablet (10 mg total) by mouth daily. Patient not taking: Reported on 04/01/2021 03/19/21   Edwin Dada, MD  pantoprazole (PROTONIX) 40 MG tablet Take 1 tablet (40 mg total) by mouth daily. Patient not taking: Reported on 04/01/2021 03/18/21   Edwin Dada, MD  sucralfate (CARAFATE) 1 GM/10ML suspension Take 10 mLs (1 g total) by mouth 4 (four) times  daily -  with meals and at bedtime. Patient not taking: Reported on 04/01/2021 03/18/21   Edwin Dada, MD    Allergies    Nsaids, Ketorolac tromethamine, and Methocarbamol  Review of Systems   Review of Systems  Constitutional:  Negative for chills and fever.  HENT:  Negative for congestion and rhinorrhea.   Eyes:  Negative for redness and visual disturbance.  Respiratory:  Positive for cough and shortness of breath. Negative for wheezing.   Cardiovascular:  Negative for chest pain and palpitations.  Gastrointestinal:  Negative for nausea and vomiting.  Genitourinary:  Negative for dysuria and urgency.  Musculoskeletal:  Negative for arthralgias and myalgias.  Skin:  Negative for pallor and wound.  Neurological:  Negative for dizziness and headaches.   Physical Exam Updated Vital Signs BP (!) 163/102 (BP Location: Left Arm)   Pulse 92   Temp 98.3 F (36.8 C) (Oral)   Resp 18   Ht 5\' 9"  (1.753 m)   Wt 52.2 kg   SpO2 100%   BMI 16.98 kg/m   Physical Exam Vitals and nursing note  reviewed.  Constitutional:      General: She is not in acute distress.    Appearance: She is well-developed. She is not diaphoretic.  HENT:     Head: Normocephalic and atraumatic.  Eyes:     Pupils: Pupils are equal, round, and reactive to light.  Cardiovascular:     Rate and Rhythm: Normal rate and regular rhythm.     Heart sounds: No murmur heard.   No friction rub. No gallop.  Pulmonary:     Effort: Pulmonary effort is normal.     Breath sounds: No wheezing, rhonchi or rales.     Comments: Patient has a forceful expiration without obvious wheezes.  She is mildly tachypneic but slowed down during conversation. Abdominal:     General: There is no distension.     Palpations: Abdomen is soft.     Tenderness: There is no abdominal tenderness.  Musculoskeletal:        General: No tenderness.     Cervical back: Normal range of motion and neck supple.  Skin:    General: Skin is warm and dry.  Neurological:     Mental Status: She is alert and oriented to person, place, and time.  Psychiatric:        Behavior: Behavior normal.    ED Results / Procedures / Treatments   Labs (all labs ordered are listed, but only abnormal results are displayed) Labs Reviewed  CBC WITH DIFFERENTIAL/PLATELET - Abnormal; Notable for the following components:      Result Value   Hemoglobin 9.2 (*)    HCT 28.2 (*)    MCV 66.4 (*)    MCH 21.6 (*)    RDW 19.8 (*)    All other components within normal limits  BASIC METABOLIC PANEL - Abnormal; Notable for the following components:   Potassium 2.2 (*)    Chloride 96 (*)    Glucose, Bld 102 (*)    Calcium 8.5 (*)    All other components within normal limits  RAPID URINE DRUG SCREEN, HOSP PERFORMED - Abnormal; Notable for the following components:   Opiates POSITIVE (*)    Cocaine POSITIVE (*)    All other components within normal limits  IRON AND TIBC - Abnormal; Notable for the following components:   Iron 10 (*)    Saturation Ratios 3 (*)    All  other components  within normal limits  FERRITIN - Abnormal; Notable for the following components:   Ferritin 10 (*)    All other components within normal limits  RETICULOCYTES - Abnormal; Notable for the following components:   Immature Retic Fract 22.6 (*)    All other components within normal limits  BLOOD GAS, VENOUS - Abnormal; Notable for the following components:   pH, Ven 7.482 (*)    pCO2, Ven 37.5 (*)    pO2, Ven 64.4 (*)    Acid-Base Excess 4.3 (*)    All other components within normal limits  LACTIC ACID, PLASMA - Abnormal; Notable for the following components:   Lactic Acid, Venous 3.4 (*)    All other components within normal limits  CBC WITH DIFFERENTIAL/PLATELET - Abnormal; Notable for the following components:   Hemoglobin 9.7 (*)    HCT 29.8 (*)    MCV 67.1 (*)    MCH 21.8 (*)    RDW 19.8 (*)    Lymphs Abs 0.3 (*)    All other components within normal limits  COMPREHENSIVE METABOLIC PANEL - Abnormal; Notable for the following components:   Potassium 2.8 (*)    Glucose, Bld 111 (*)    Calcium 8.7 (*)    Total Protein 6.4 (*)    Albumin 3.0 (*)    All other components within normal limits  D-DIMER, QUANTITATIVE (NOT AT [facility]) - Abnormal; Notable for the following components:   D-Dimer, Quant 0.55 (*)    All other components within normal limits  RESP PANEL BY RT-PCR (FLU A&B, COVID) ARPGX2  RESPIRATORY PANEL BY PCR  MRSA NEXT GEN BY PCR, NASAL  CULTURE, BLOOD (ROUTINE X 2)  CULTURE, BLOOD (ROUTINE X 2)  EXPECTORATED SPUTUM ASSESSMENT W GRAM STAIN, RFLX TO RESP C  MAGNESIUM  URINALYSIS, ROUTINE W REFLEX MICROSCOPIC  VITAMIN B12  FOLATE  LACTIC ACID, PLASMA  MAGNESIUM  PHOSPHORUS  TSH  CK  ETHANOL  LIPASE, BLOOD  OCCULT BLOOD X 1 CARD TO LAB, STOOL  CBG MONITORING, ED  TROPONIN I (HIGH SENSITIVITY)    EKG None  Radiology CT Angio Chest Pulmonary Embolism (PE) W or WO Contrast  Result Date: 04/02/2021 CLINICAL DATA:  Wheezing and shortness of  breath. Recent cocaine use. EXAM: CT ANGIOGRAPHY CHEST WITH CONTRAST TECHNIQUE: Multidetector CT imaging of the chest was performed using the standard protocol during bolus administration of intravenous contrast. Multiplanar CT image reconstructions and MIPs were obtained to evaluate the vascular anatomy. CONTRAST:  48mL OMNIPAQUE IOHEXOL 350 MG/ML SOLN COMPARISON:  02/26/2021 FINDINGS: Cardiovascular: Satisfactory opacification of the pulmonary arteries to the segmental level. No evidence of pulmonary embolism. Normal heart size. No pericardial effusion. Age advanced atherosclerosis of the aorta and coronaries. Mediastinum/Nodes: Negative for adenopathy or mass Lungs/Pleura: Generalized airway thickening patchy reticulation and micro nodularity. The micro nodularity is greatest in the apical lungs, question smoking-related respiratory bronchiolitis. Upper Abdomen: Gastric bypass with persisting gas collection near the gastroenteric anastomosis, most consistent with marginal ulcer. This gaseous collection measures up to 2.7 cm in diameter. No pneumoperitoneum. Musculoskeletal: No acute finding Review of the MIP images confirms the above findings. IMPRESSION: 1. Negative for pulmonary embolism. 2. Bronchitis and bronchiolitis. 3. Gastric bypass with marginal ulcer that is known from prior CTs. Electronically Signed   By: Jorje Guild M.D.   On: 04/02/2021 05:21   DG Chest Port 1 View  Result Date: 04/01/2021 CLINICAL DATA:  Shortness of breath, cough x3 days EXAM: PORTABLE CHEST 1 VIEW COMPARISON:  03/30/2021 FINDINGS:  Lungs are clear.  No pleural effusion or pneumothorax. The heart is normal in size. IMPRESSION: No evidence of acute cardiopulmonary disease. Electronically Signed   By: Julian Hy M.D.   On: 04/01/2021 19:07    Procedures Procedures   Medications Ordered in ED Medications  acetaminophen (TYLENOL) tablet 650 mg (650 mg Oral Given 04/02/21 0806)    Or  acetaminophen (TYLENOL)  suppository 650 mg ( Rectal See Alternative 04/02/21 0806)  HYDROcodone-acetaminophen (NORCO/VICODIN) 5-325 MG per tablet 1-2 tablet (1 tablet Oral Given 04/02/21 1438)  albuterol (PROVENTIL) (2.5 MG/3ML) 0.083% nebulizer solution 2.5 mg (has no administration in time range)  nicotine (NICODERM CQ - dosed in mg/24 hours) patch 21 mg (21 mg Transdermal Patch Applied 04/02/21 0807)  cloNIDine (CATAPRES) tablet 0.1 mg (0.1 mg Oral Given 04/02/21 0953)  pantoprazole (PROTONIX) EC tablet 40 mg (40 mg Oral Given 04/02/21 0806)  sucralfate (CARAFATE) 1 GM/10ML suspension 1 g (1 g Oral Given 04/02/21 1211)  traMADol (ULTRAM) tablet 50 mg (has no administration in time range)  amLODipine (NORVASC) tablet 10 mg (10 mg Oral Given 04/02/21 0806)  lisinopril (ZESTRIL) tablet 10 mg (10 mg Oral Given 04/02/21 0806)  potassium chloride SA (KLOR-CON) CR tablet 40 mEq (40 mEq Oral Given 04/02/21 0806)  methocarbamol (ROBAXIN) tablet 750 mg (750 mg Oral Given 04/02/21 0953)  methylPREDNISolone sodium succinate (SOLU-MEDROL) 125 mg/2 mL injection 120 mg (120 mg Intravenous Given 04/02/21 1212)  ipratropium-albuterol (DUONEB) 0.5-2.5 (3) MG/3ML nebulizer solution 3 mL (has no administration in time range)  lidocaine (LIDODERM) 5 % 1 patch (has no administration in time range)  ipratropium-albuterol (DUONEB) 0.5-2.5 (3) MG/3ML nebulizer solution 3 mL (3 mLs Nebulization Given 04/01/21 1955)  acetaminophen (TYLENOL) tablet 1,000 mg (1,000 mg Oral Given 04/01/21 1915)  ibuprofen (ADVIL) tablet 800 mg (800 mg Oral Given 04/01/21 1915)  benzonatate (TESSALON) capsule 100 mg (100 mg Oral Given 04/01/21 1915)  potassium chloride 10 mEq in 100 mL IVPB (10 mEq Intravenous New Bag/Given 04/02/21 0110)  methylPREDNISolone sodium succinate (SOLU-MEDROL) 125 mg/2 mL injection 125 mg (125 mg Intravenous Given 04/01/21 2140)  magnesium oxide (MAG-OX) tablet 800 mg (800 mg Oral Given 04/01/21 2307)  cloNIDine (CATAPRES) tablet 0.2 mg  (0.2 mg Oral Given 04/02/21 0057)  potassium chloride 10 mEq in 100 mL IVPB (10 mEq Intravenous New Bag/Given 04/02/21 0645)  sodium chloride 0.9 % bolus 500 mL (500 mLs Intravenous New Bag/Given 04/02/21 0423)  iohexol (OMNIPAQUE) 350 MG/ML injection 50 mL (50 mLs Intravenous Contrast Given 04/02/21 0448)    ED Course  I have reviewed the triage vital signs and the nursing notes.  Pertinent labs & imaging results that were available during my care of the patient were reviewed by me and considered in my medical decision making (see chart for details).    MDM Rules/Calculators/A&P                           45 yo F with a chief complaints of cough and shortness of breath.  This is been going on for couple days.  Was seen at [facility] and had an unremarkable work-up.  We will repeat test here today.  Chest x-ray blood work troponin.  Patient has shown some signs of drug-seeking behavior while in the ED.  Does have a history of polysubstance abuse.  We will hold off on narcotic administration here.  The patient's potassium is low at 2.2.  EKG with tachycardia difficult  to assess U waves or prolonged QT.  Will discuss with medicine.  CRITICAL CARE Performed by: Cecilio Asper   Total critical care time: 35 minutes  Critical care time was exclusive of separately billable procedures and treating other patients.  Critical care was necessary to treat or prevent imminent or life-threatening deterioration.  Critical care was time spent personally by me on the following activities: development of treatment plan with patient and/or surrogate as well as nursing, discussions with consultants, evaluation of patient's response to treatment, examination of patient, obtaining history from patient or surrogate, ordering and performing treatments and interventions, ordering and review of laboratory studies, ordering and review of radiographic studies, pulse oximetry and re-evaluation of  patient's condition.  The patients results and plan were reviewed and discussed.   Any x-rays performed were independently reviewed by myself.   Differential diagnosis were considered with the presenting HPI.  Medications  acetaminophen (TYLENOL) tablet 650 mg (650 mg Oral Given 04/02/21 0806)    Or  acetaminophen (TYLENOL) suppository 650 mg ( Rectal See Alternative 04/02/21 0806)  HYDROcodone-acetaminophen (NORCO/VICODIN) 5-325 MG per tablet 1-2 tablet (1 tablet Oral Given 04/02/21 1438)  albuterol (PROVENTIL) (2.5 MG/3ML) 0.083% nebulizer solution 2.5 mg (has no administration in time range)  nicotine (NICODERM CQ - dosed in mg/24 hours) patch 21 mg (21 mg Transdermal Patch Applied 04/02/21 0807)  cloNIDine (CATAPRES) tablet 0.1 mg (0.1 mg Oral Given 04/02/21 0953)  pantoprazole (PROTONIX) EC tablet 40 mg (40 mg Oral Given 04/02/21 0806)  sucralfate (CARAFATE) 1 GM/10ML suspension 1 g (1 g Oral Given 04/02/21 1211)  traMADol (ULTRAM) tablet 50 mg (has no administration in time range)  amLODipine (NORVASC) tablet 10 mg (10 mg Oral Given 04/02/21 0806)  lisinopril (ZESTRIL) tablet 10 mg (10 mg Oral Given 04/02/21 0806)  potassium chloride SA (KLOR-CON) CR tablet 40 mEq (40 mEq Oral Given 04/02/21 0806)  methocarbamol (ROBAXIN) tablet 750 mg (750 mg Oral Given 04/02/21 0953)  methylPREDNISolone sodium succinate (SOLU-MEDROL) 125 mg/2 mL injection 120 mg (120 mg Intravenous Given 04/02/21 1212)  ipratropium-albuterol (DUONEB) 0.5-2.5 (3) MG/3ML nebulizer solution 3 mL (has no administration in time range)  lidocaine (LIDODERM) 5 % 1 patch (has no administration in time range)  ipratropium-albuterol (DUONEB) 0.5-2.5 (3) MG/3ML nebulizer solution 3 mL (3 mLs Nebulization Given 04/01/21 1955)  acetaminophen (TYLENOL) tablet 1,000 mg (1,000 mg Oral Given 04/01/21 1915)  ibuprofen (ADVIL) tablet 800 mg (800 mg Oral Given 04/01/21 1915)  benzonatate (TESSALON) capsule 100 mg (100 mg Oral Given  04/01/21 1915)  potassium chloride 10 mEq in 100 mL IVPB (10 mEq Intravenous New Bag/Given 04/02/21 0110)  methylPREDNISolone sodium succinate (SOLU-MEDROL) 125 mg/2 mL injection 125 mg (125 mg Intravenous Given 04/01/21 2140)  magnesium oxide (MAG-OX) tablet 800 mg (800 mg Oral Given 04/01/21 2307)  cloNIDine (CATAPRES) tablet 0.2 mg (0.2 mg Oral Given 04/02/21 0057)  potassium chloride 10 mEq in 100 mL IVPB (10 mEq Intravenous New Bag/Given 04/02/21 0645)  sodium chloride 0.9 % bolus 500 mL (500 mLs Intravenous New Bag/Given 04/02/21 0423)  iohexol (OMNIPAQUE) 350 MG/ML injection 50 mL (50 mLs Intravenous Contrast Given 04/02/21 0448)    Vitals:   04/02/21 0057 04/02/21 0800 04/02/21 0938 04/02/21 1221  BP: (!) 210/118 (!) 228/137 (!) 198/138 (!) 163/102  Pulse: 95 66  92  Resp: 19 (!) 22  18  Temp: 98.2 F (36.8 C)   98.3 F (36.8 C)  TempSrc: Oral   Oral  SpO2: 96% 100%  100%  Weight:      Height:        Final diagnoses:  Moderate persistent asthma with exacerbation    Admission/ observation were discussed with the admitting physician, patient and/or family and they are comfortable with the plan.    Final Clinical Impression(s) / ED Diagnoses Final diagnoses:  Moderate persistent asthma with exacerbation    Rx / DC Orders ED Discharge Orders     None        Deno Etienne, DO 04/02/21 1507

## 2021-04-01 NOTE — ED Triage Notes (Signed)
Pt arrived via [facility] from vehicle. Per EMS, pt c/o SOB for the past 4-5 along with CP on respiration. EMS reports wheezing in all lung fields on their arrival which did not improve much with 1 albuterol neb and 1 duo neb tx. Pt caox4 and tachypneic with productive cough on arrival to ED. EMS further reports pt stated she used cocaine 2 hours ago. EMS admin 324 mg aspirin for CP. BP 183/132, HR 96, SPO2 WNL.

## 2021-04-02 ENCOUNTER — Observation Stay (HOSPITAL_COMMUNITY): Payer: Medicaid Other

## 2021-04-02 DIAGNOSIS — Z9049 Acquired absence of other specified parts of digestive tract: Secondary | ICD-10-CM | POA: Diagnosis not present

## 2021-04-02 DIAGNOSIS — R0600 Dyspnea, unspecified: Secondary | ICD-10-CM | POA: Diagnosis not present

## 2021-04-02 DIAGNOSIS — Z981 Arthrodesis status: Secondary | ICD-10-CM | POA: Diagnosis not present

## 2021-04-02 DIAGNOSIS — F1410 Cocaine abuse, uncomplicated: Secondary | ICD-10-CM | POA: Diagnosis present

## 2021-04-02 DIAGNOSIS — J4551 Severe persistent asthma with (acute) exacerbation: Secondary | ICD-10-CM | POA: Diagnosis not present

## 2021-04-02 DIAGNOSIS — M1990 Unspecified osteoarthritis, unspecified site: Secondary | ICD-10-CM | POA: Diagnosis present

## 2021-04-02 DIAGNOSIS — E43 Unspecified severe protein-calorie malnutrition: Secondary | ICD-10-CM | POA: Diagnosis present

## 2021-04-02 DIAGNOSIS — G629 Polyneuropathy, unspecified: Secondary | ICD-10-CM | POA: Diagnosis present

## 2021-04-02 DIAGNOSIS — Z20822 Contact with and (suspected) exposure to covid-19: Secondary | ICD-10-CM | POA: Diagnosis present

## 2021-04-02 DIAGNOSIS — E876 Hypokalemia: Secondary | ICD-10-CM | POA: Diagnosis present

## 2021-04-02 DIAGNOSIS — G8929 Other chronic pain: Secondary | ICD-10-CM | POA: Diagnosis present

## 2021-04-02 DIAGNOSIS — F1123 Opioid dependence with withdrawal: Secondary | ICD-10-CM | POA: Diagnosis not present

## 2021-04-02 DIAGNOSIS — F319 Bipolar disorder, unspecified: Secondary | ICD-10-CM | POA: Diagnosis present

## 2021-04-02 DIAGNOSIS — Z681 Body mass index (BMI) 19 or less, adult: Secondary | ICD-10-CM | POA: Diagnosis not present

## 2021-04-02 DIAGNOSIS — Z8249 Family history of ischemic heart disease and other diseases of the circulatory system: Secondary | ICD-10-CM | POA: Diagnosis not present

## 2021-04-02 DIAGNOSIS — I10 Essential (primary) hypertension: Secondary | ICD-10-CM | POA: Diagnosis not present

## 2021-04-02 DIAGNOSIS — Z833 Family history of diabetes mellitus: Secondary | ICD-10-CM | POA: Diagnosis not present

## 2021-04-02 DIAGNOSIS — Z9884 Bariatric surgery status: Secondary | ICD-10-CM | POA: Diagnosis not present

## 2021-04-02 DIAGNOSIS — K289 Gastrojejunal ulcer, unspecified as acute or chronic, without hemorrhage or perforation: Secondary | ICD-10-CM | POA: Diagnosis present

## 2021-04-02 DIAGNOSIS — E538 Deficiency of other specified B group vitamins: Secondary | ICD-10-CM | POA: Diagnosis present

## 2021-04-02 DIAGNOSIS — D509 Iron deficiency anemia, unspecified: Secondary | ICD-10-CM | POA: Diagnosis present

## 2021-04-02 DIAGNOSIS — R64 Cachexia: Secondary | ICD-10-CM | POA: Diagnosis present

## 2021-04-02 DIAGNOSIS — Z5900 Homelessness unspecified: Secondary | ICD-10-CM | POA: Diagnosis not present

## 2021-04-02 DIAGNOSIS — J4541 Moderate persistent asthma with (acute) exacerbation: Secondary | ICD-10-CM | POA: Diagnosis present

## 2021-04-02 DIAGNOSIS — I160 Hypertensive urgency: Secondary | ICD-10-CM | POA: Diagnosis present

## 2021-04-02 DIAGNOSIS — F17210 Nicotine dependence, cigarettes, uncomplicated: Secondary | ICD-10-CM | POA: Diagnosis present

## 2021-04-02 LAB — RESPIRATORY PANEL BY PCR

## 2021-04-02 LAB — COMPREHENSIVE METABOLIC PANEL
ALT: 11 U/L (ref 0–44)
AST: 17 U/L (ref 15–41)
Albumin: 3 g/dL — ABNORMAL LOW (ref 3.5–5.0)
Alkaline Phosphatase: 91 U/L (ref 38–126)
Anion gap: 12 (ref 5–15)
BUN: 13 mg/dL (ref 6–20)
CO2: 25 mmol/L (ref 22–32)
Calcium: 8.7 mg/dL — ABNORMAL LOW (ref 8.9–10.3)
Chloride: 98 mmol/L (ref 98–111)
Creatinine, Ser: 0.95 mg/dL (ref 0.44–1.00)
GFR, Estimated: >60 mL/min (ref >60)
Glucose, Bld: 111 mg/dL — ABNORMAL HIGH (ref 70–99)
Potassium: 2.8 mmol/L — ABNORMAL LOW (ref 3.5–5.1)
Sodium: 135 mmol/L (ref 135–145)
Total Bilirubin: 0.7 mg/dL (ref 0.3–1.2)
Total Protein: 6.4 g/dL — ABNORMAL LOW (ref 6.5–8.1)

## 2021-04-02 LAB — CBC WITH DIFFERENTIAL/PLATELET
Abs Immature Granulocytes: 0 10*3/uL (ref 0.00–0.07)
Basophils Absolute: 0 10*3/uL (ref 0.0–0.1)
Basophils Relative: 0 %
Eosinophils Absolute: 0 10*3/uL (ref 0.0–0.5)
Eosinophils Relative: 0 %
HCT: 29.8 % — ABNORMAL LOW (ref 36.0–46.0)
Hemoglobin: 9.7 g/dL — ABNORMAL LOW (ref 12.0–15.0)
Lymphocytes Relative: 5 %
Lymphs Abs: 0.3 10*3/uL — ABNORMAL LOW (ref 0.7–4.0)
MCH: 21.8 pg — ABNORMAL LOW (ref 26.0–34.0)
MCHC: 32.6 g/dL (ref 30.0–36.0)
MCV: 67.1 fL — ABNORMAL LOW (ref 80.0–100.0)
Monocytes Absolute: 0.3 10*3/uL (ref 0.1–1.0)
Monocytes Relative: 5 %
Neutro Abs: 6 10*3/uL (ref 1.7–7.7)
Neutrophils Relative %: 90 %
Platelets: 342 10*3/uL (ref 150–400)
RBC: 4.44 MIL/uL (ref 3.87–5.11)
RDW: 19.8 % — ABNORMAL HIGH (ref 11.5–15.5)
WBC: 6.7 10*3/uL (ref 4.0–10.5)
nRBC: 0 % (ref 0.0–0.2)
nRBC: 0 /100 WBC

## 2021-04-02 LAB — D-DIMER, QUANTITATIVE: D-Dimer, Quant: 0.55 ug/mL-FEU — ABNORMAL HIGH (ref 0.00–0.50)

## 2021-04-02 LAB — LIPASE, BLOOD: Lipase: 38 U/L (ref 11–51)

## 2021-04-02 LAB — BLOOD GAS, VENOUS
Acid-Base Excess: 4.3 mmol/L — ABNORMAL HIGH (ref 0.0–2.0)
Bicarbonate: 27.7 mmol/L (ref 20.0–28.0)
O2 Saturation: 92.3 %
Patient temperature: 37
pCO2, Ven: 37.5 mmHg — ABNORMAL LOW (ref 44.0–60.0)
pH, Ven: 7.482 — ABNORMAL HIGH (ref 7.250–7.430)
pO2, Ven: 64.4 mmHg — ABNORMAL HIGH (ref 32.0–45.0)

## 2021-04-02 LAB — EXPECTORATED SPUTUM ASSESSMENT W GRAM STAIN, RFLX TO RESP C

## 2021-04-02 LAB — IRON AND TIBC
Iron: 10 ug/dL — ABNORMAL LOW (ref 28–170)
Saturation Ratios: 3 % — ABNORMAL LOW (ref 10.4–31.8)
TIBC: 382 ug/dL (ref 250–450)
UIBC: 372 ug/dL

## 2021-04-02 LAB — PHOSPHORUS: Phosphorus: 3 mg/dL (ref 2.5–4.6)

## 2021-04-02 LAB — LACTIC ACID, PLASMA
Lactic Acid, Venous: 3.4 mmol/L (ref 0.5–1.9)
Lactic Acid, Venous: 1.9 mmol/L (ref 0.5–1.9)

## 2021-04-02 LAB — CK: Total CK: 50 U/L (ref 38–234)

## 2021-04-02 LAB — FERRITIN: Ferritin: 10 ng/mL — ABNORMAL LOW (ref 11–307)

## 2021-04-02 LAB — RETICULOCYTES
Immature Retic Fract: 22.6 % — ABNORMAL HIGH (ref 2.3–15.9)
RBC.: 4.45 MIL/uL (ref 3.87–5.11)
Retic Count, Absolute: 58.3 10*3/uL (ref 19.0–186.0)
Retic Ct Pct: 1.3 % (ref 0.4–3.1)

## 2021-04-02 LAB — VITAMIN B12: Vitamin B-12: 204 pg/mL (ref 180–914)

## 2021-04-02 LAB — MRSA NEXT GEN BY PCR, NASAL: MRSA by PCR Next Gen: DETECTED — AB

## 2021-04-02 LAB — TSH: TSH: 0.655 u[IU]/mL (ref 0.350–4.500)

## 2021-04-02 LAB — OCCULT BLOOD X 1 CARD TO LAB, STOOL: Fecal Occult Bld: NEGATIVE

## 2021-04-02 LAB — ETHANOL: Alcohol, Ethyl (B): <10 mg/dL (ref <10)

## 2021-04-02 LAB — MAGNESIUM: Magnesium: 1.8 mg/dL (ref 1.7–2.4)

## 2021-04-02 LAB — FOLATE: Folate: 9 ng/mL (ref >5.9)

## 2021-04-02 MED ORDER — PANTOPRAZOLE SODIUM 40 MG PO TBEC
40.0000 mg | DELAYED_RELEASE_TABLET | Freq: Two times a day (BID) | ORAL | Status: DC
  Administered 2021-04-02 – 2021-04-06 (×9): 40 mg via ORAL
  Filled 2021-04-02 (×10): qty 1

## 2021-04-02 MED ORDER — AMLODIPINE BESYLATE 10 MG PO TABS
10.0000 mg | ORAL_TABLET | Freq: Every day | ORAL | Status: DC
  Administered 2021-04-02 – 2021-04-06 (×5): 10 mg via ORAL
  Filled 2021-04-02 (×5): qty 1

## 2021-04-02 MED ORDER — IPRATROPIUM-ALBUTEROL 0.5-2.5 (3) MG/3ML IN SOLN
3.0000 mL | Freq: Four times a day (QID) | RESPIRATORY_TRACT | Status: DC
  Administered 2021-04-02: 3 mL via RESPIRATORY_TRACT
  Filled 2021-04-02 (×2): qty 3

## 2021-04-02 MED ORDER — GUAIFENESIN-DM 100-10 MG/5ML PO SYRP
5.0000 mL | ORAL_SOLUTION | ORAL | Status: DC | PRN
  Administered 2021-04-02 – 2021-04-05 (×8): 5 mL via ORAL
  Filled 2021-04-02 (×8): qty 10

## 2021-04-02 MED ORDER — HYDROMORPHONE HCL 1 MG/ML IJ SOLN
0.5000 mg | INTRAMUSCULAR | Status: DC | PRN
  Filled 2021-04-02: qty 0.5

## 2021-04-02 MED ORDER — NICOTINE 21 MG/24HR TD PT24
21.0000 mg | MEDICATED_PATCH | Freq: Every day | TRANSDERMAL | Status: DC
  Administered 2021-04-02 – 2021-04-06 (×5): 21 mg via TRANSDERMAL
  Filled 2021-04-02 (×5): qty 1

## 2021-04-02 MED ORDER — IPRATROPIUM-ALBUTEROL 0.5-2.5 (3) MG/3ML IN SOLN
3.0000 mL | Freq: Two times a day (BID) | RESPIRATORY_TRACT | Status: DC
  Administered 2021-04-02 – 2021-04-06 (×6): 3 mL via RESPIRATORY_TRACT
  Filled 2021-04-02 (×7): qty 3

## 2021-04-02 MED ORDER — IOHEXOL 350 MG/ML SOLN
50.0000 mL | Freq: Once | INTRAVENOUS | Status: AC | PRN
  Administered 2021-04-02: 50 mL via INTRAVENOUS

## 2021-04-02 MED ORDER — PREDNISONE 20 MG PO TABS: 40.0000 mg | ORAL_TABLET | Freq: Every day | ORAL | Status: DC

## 2021-04-02 MED ORDER — ENOXAPARIN SODIUM 40 MG/0.4ML IJ SOSY: 40.0000 mg | PREFILLED_SYRINGE | INTRAMUSCULAR | Status: DC

## 2021-04-02 MED ORDER — SODIUM CHLORIDE 0.9 % IV BOLUS
500.0000 mL | Freq: Once | INTRAVENOUS | Status: AC
  Administered 2021-04-02: 500 mL via INTRAVENOUS

## 2021-04-02 MED ORDER — MUPIROCIN 2 % EX OINT
1.0000 "application " | TOPICAL_OINTMENT | Freq: Two times a day (BID) | CUTANEOUS | Status: DC
  Administered 2021-04-02 – 2021-04-05 (×6): 1 via NASAL
  Filled 2021-04-02 (×2): qty 22

## 2021-04-02 MED ORDER — CLONIDINE HCL 0.1 MG PO TABS
0.1000 mg | ORAL_TABLET | ORAL | Status: DC | PRN
  Administered 2021-04-02 – 2021-04-03 (×4): 0.1 mg via ORAL
  Filled 2021-04-02 (×4): qty 1

## 2021-04-02 MED ORDER — CHLORHEXIDINE GLUCONATE CLOTH 2 % EX PADS
6.0000 | MEDICATED_PAD | Freq: Every day | CUTANEOUS | Status: DC
  Administered 2021-04-03 – 2021-04-05 (×3): 6 via TOPICAL

## 2021-04-02 MED ORDER — SODIUM CHLORIDE 0.9 % IV SOLN: INTRAVENOUS | Status: DC

## 2021-04-02 MED ORDER — POTASSIUM CHLORIDE CRYS ER 20 MEQ PO TBCR
40.0000 meq | EXTENDED_RELEASE_TABLET | Freq: Two times a day (BID) | ORAL | Status: AC
  Administered 2021-04-02: 40 meq via ORAL
  Filled 2021-04-02 (×2): qty 2

## 2021-04-02 MED ORDER — METHYLPREDNISOLONE SODIUM SUCC 125 MG IJ SOLR: 80.0000 mg | INTRAMUSCULAR | Status: DC

## 2021-04-02 MED ORDER — AMLODIPINE BESYLATE 10 MG PO TABS: 10.0000 mg | ORAL_TABLET | Freq: Every day | ORAL | Status: DC

## 2021-04-02 MED ORDER — METHOCARBAMOL 750 MG PO TABS
750.0000 mg | ORAL_TABLET | Freq: Three times a day (TID) | ORAL | Status: DC | PRN
  Administered 2021-04-02 – 2021-04-05 (×5): 750 mg via ORAL
  Filled 2021-04-02 (×6): qty 1

## 2021-04-02 MED ORDER — METHYLPREDNISOLONE SODIUM SUCC 125 MG IJ SOLR
120.0000 mg | INTRAMUSCULAR | Status: DC
  Administered 2021-04-02: 120 mg via INTRAVENOUS
  Filled 2021-04-02 (×3): qty 1.92

## 2021-04-02 MED ORDER — HYDROCODONE-ACETAMINOPHEN 5-325 MG PO TABS
1.0000 | ORAL_TABLET | ORAL | Status: DC | PRN
  Administered 2021-04-02: 1 via ORAL
  Administered 2021-04-02 – 2021-04-03 (×7): 2 via ORAL
  Administered 2021-04-03: 1 via ORAL
  Administered 2021-04-04: 2 via ORAL
  Filled 2021-04-02: qty 1
  Filled 2021-04-02 (×8): qty 2
  Filled 2021-04-02: qty 1
  Filled 2021-04-02: qty 2

## 2021-04-02 MED ORDER — SUCRALFATE 1 GM/10ML PO SUSP
1.0000 g | Freq: Three times a day (TID) | ORAL | Status: DC
  Administered 2021-04-02 – 2021-04-06 (×16): 1 g via ORAL
  Filled 2021-04-02 (×21): qty 10

## 2021-04-02 MED ORDER — LIDOCAINE 5 % EX PTCH
1.0000 | MEDICATED_PATCH | CUTANEOUS | Status: DC
  Administered 2021-04-02 – 2021-04-04 (×3): 1 via TRANSDERMAL
  Filled 2021-04-02 (×4): qty 1

## 2021-04-02 MED ORDER — ACETAMINOPHEN 650 MG RE SUPP: 650.0000 mg | Freq: Four times a day (QID) | RECTAL | Status: DC | PRN

## 2021-04-02 MED ORDER — CLONIDINE HCL 0.2 MG PO TABS
0.2000 mg | ORAL_TABLET | Freq: Once | ORAL | Status: AC
  Administered 2021-04-02: 0.2 mg via ORAL
  Filled 2021-04-02: qty 1

## 2021-04-02 MED ORDER — ALBUTEROL SULFATE (2.5 MG/3ML) 0.083% IN NEBU
2.5000 mg | INHALATION_SOLUTION | RESPIRATORY_TRACT | Status: DC | PRN
  Administered 2021-04-03 – 2021-04-04 (×2): 2.5 mg via RESPIRATORY_TRACT
  Filled 2021-04-02 (×2): qty 3

## 2021-04-02 MED ORDER — PANTOPRAZOLE SODIUM 40 MG PO TBEC: 40.0000 mg | DELAYED_RELEASE_TABLET | Freq: Every day | ORAL | Status: DC

## 2021-04-02 MED ORDER — ACETAMINOPHEN 325 MG PO TABS
650.0000 mg | ORAL_TABLET | Freq: Four times a day (QID) | ORAL | Status: DC | PRN
  Administered 2021-04-02 – 2021-04-06 (×3): 650 mg via ORAL
  Filled 2021-04-02 (×3): qty 2

## 2021-04-02 MED ORDER — SODIUM CHLORIDE 0.9 % IV SOLN
75.0000 mL/h | INTRAVENOUS | Status: DC
  Administered 2021-04-02: 75 mL/h via INTRAVENOUS

## 2021-04-02 MED ORDER — TRAMADOL HCL 50 MG PO TABS
50.0000 mg | ORAL_TABLET | Freq: Four times a day (QID) | ORAL | Status: DC | PRN
  Administered 2021-04-02 – 2021-04-04 (×6): 50 mg via ORAL
  Filled 2021-04-02 (×9): qty 1

## 2021-04-02 MED ORDER — POTASSIUM CHLORIDE 10 MEQ/100ML IV SOLN
10.0000 meq | INTRAVENOUS | Status: AC
  Administered 2021-04-02 (×4): 10 meq via INTRAVENOUS
  Filled 2021-04-02 (×4): qty 100

## 2021-04-02 MED ORDER — LISINOPRIL 10 MG PO TABS
10.0000 mg | ORAL_TABLET | Freq: Every day | ORAL | Status: DC
  Administered 2021-04-02 – 2021-04-06 (×5): 10 mg via ORAL
  Filled 2021-04-02 (×5): qty 1

## 2021-04-02 NOTE — Progress Notes (Signed)
Pt still c/o pain 10/10 in her lower back and mid to left chest with cough. Olena Heckle, NP notified, new order for Dilaudid placed, but RN instructed to give Ultram first.

## 2021-04-02 NOTE — Progress Notes (Signed)
Date and time results received: 04/02/21 0307 (use smartphrase ".now" to insert current time)  Test: Lactic Acid Critical Value: 3.4  Name of Provider Notified: Doutova MD  Orders Received? Or Actions Taken?: New orders provided. Administered medication and observing patient.

## 2021-04-02 NOTE — Progress Notes (Addendum)
PROGRESS NOTE    Terri [name]  WIO:[id number] DOB: 1975/06/18 DOA: 04/01/2021 PCP: Pcp, No  Brief Narrative: 58/homeless female with history of asthma/COPD, heavy tobacco abuse, cocaine and heroin abuse presented to the ED with cough wheezing and shortness of breath, she had just smoked cocaine couple of hours prior to admission. -She was recently admitted for UTI, treated with IV ceftriaxone, she was discharged home on antihypertensives. -Does not have a PCP -In the ED she was noted to be severely hypokalemic, also noted to have microcytic anemia   Assessment & Plan:   COPD/asthma exacerbation -Continue IV steroids today, chest x-ray is unremarkable, influenza and COVID PCR negative -Duo nebs -CT chest notable for bronchitis and bronchiolitis, follow-up respiratory virus panel  Severe iron deficiency anemia B12 deficiency -Known marginal ulcer at the site of gastric bypass -Last endoscopy in 8/22 noted giant ulcer of the gastrojejunal anastomotic site, not actively bleeding then -Hemoglobin ranges from 8-10, will give IV iron this admission -Resume PPI -Patient reports having a colonoscopy in the last 5 years, no records in our system  Severe hypokalemia -Replace  Accelerated hypertension -Likely worsened by cocaine use and steroids -Add amlodipine and lisinopril -PRN clonidine  Polysubstance abuse -Smokes cocaine and heroin multiple times a week  Tobacco abuse -Counseled, nicotine patch  Chronic pain -Vicodin resumed, add muscle relaxer  S/p gastric bypass surgery -Not taking supplemental multi vitamins  Severe protein calorie malnutrition -add supplements  DVT prophylaxis: SCDs Code Status: Full Code Family Communication: Friend at bedside Disposition Plan:  Status is: Observation  The patient will require care spanning > 2 midnights and should be moved to inpatient because: COPD exacerbation, accelerated hypertension, severe hypokalemia and  anemia   Procedures:   Antimicrobials:    Subjective: -Complains of pain all over, requesting IV pain meds -Breathing reportedly improving, not back to baseline, uses heroin and cocaine almost every day, currently homeless  Objective: Vitals:   04/01/21 2331 04/02/21 0057 04/02/21 0800 04/02/21 0938  BP: (!) 187/125 (!) 210/118 (!) 228/137 (!) 198/138  Pulse: 93 95 66   Resp: 20 19 (!) 22   Temp: 98.4 F (36.9 C) 98.2 F (36.8 C)    TempSrc: Oral Oral    SpO2: 97% 96% 100%   Weight:      Height:       No intake or output data in the 24 hours ending 04/02/21 1121 Filed Weights   04/01/21 1834  Weight: 52.2 kg    Examination:  General exam: Chronically ill cachectic female laying in bed, awake alert oriented x3, anxious, restless CVS: S1-S2, regular rhythm, tachycardic Lungs: Poor air movement bilaterally, scattered expiratory wheezes Abdomen: Soft, nontender, scaphoid, bowel sounds present Extremities: No edema Skin: No rash on exposed skin Psychiatry: Mood & affect appropriate.     Data Reviewed:   CBC: Recent Labs  Lab 03/31/21 0525 04/01/21 1921 04/02/21 0115  WBC 9.9 7.2 6.7  NEUTROABS  --  5.3 6.0  HGB 10.6* 9.2* 9.7*  HCT 32.2* 28.2* 29.8*  MCV 66.3* 66.4* 67.1*  PLT 377 327 426   Basic Metabolic Panel: Recent Labs  Lab 03/31/21 0525 04/01/21 1921 04/02/21 0115  NA 131* 135 135  K 2.6* 2.2* 2.8*  CL 95* 96* 98  CO2 27 27 25   GLUCOSE 127* 102* 111*  BUN 7 13 13   CREATININE 0.52 0.97 0.95  CALCIUM 8.7* 8.5* 8.7*  MG  --  1.8 1.8  PHOS  --   --  3.0  GFR: Estimated Creatinine Clearance: 60.3 mL/min (by C-G formula based on SCr of 0.95 mg/dL). Liver Function Tests: Recent Labs  Lab 04/02/21 0115  AST 17  ALT 11  ALKPHOS 91  BILITOT 0.7  PROT 6.4*  ALBUMIN 3.0*   Recent Labs  Lab 04/02/21 0230  LIPASE 38   No results for input(s): AMMONIA in the last 168 hours. Coagulation Profile: No results for input(s): INR, PROTIME  in the last 168 hours. Cardiac Enzymes: Recent Labs  Lab 04/02/21 0055  CKTOTAL 50   BNP (last 3 results) No results for input(s): PROBNP in the last 8760 hours. HbA1C: No results for input(s): HGBA1C in the last 72 hours. CBG: Recent Labs  Lab 04/01/21 1822  GLUCAP 96   Lipid Profile: No results for input(s): CHOL, HDL, LDLCALC, TRIG, CHOLHDL, LDLDIRECT in the last 72 hours. Thyroid Function Tests: Recent Labs    04/02/21 0115  TSH 0.655   Anemia Panel: Recent Labs    04/02/21 0115  VITAMINB12 204  FOLATE 9.0  FERRITIN 10*  TIBC 382  IRON 10*  RETICCTPCT 1.3   Urine analysis:    Component Value Date/Time   COLORURINE YELLOW 04/01/2021 2150   APPEARANCEUR CLEAR 04/01/2021 2150   LABSPEC 1.006 04/01/2021 2150   PHURINE 6.0 04/01/2021 2150   GLUCOSEU NEGATIVE 04/01/2021 2150   HGBUR NEGATIVE 04/01/2021 2150   BILIRUBINUR NEGATIVE 04/01/2021 2150   KETONESUR NEGATIVE 04/01/2021 2150   PROTEINUR NEGATIVE 04/01/2021 2150   UROBILINOGEN 1.0 02/09/2015 1228   NITRITE NEGATIVE 04/01/2021 2150   LEUKOCYTESUR NEGATIVE 04/01/2021 2150   Sepsis Labs: @LABRCNTIP (procalcitonin:4,lacticidven:4)  ) Recent Results (from the past 240 hour(s))  Resp Panel by RT-PCR (Flu A&B, Covid) Nasopharyngeal Swab     Status: None   Collection Time: 04/01/21  6:48 PM   Specimen: Nasopharyngeal Swab; Nasopharyngeal(NP) swabs in vial transport medium  Result Value Ref Range Status   SARS Coronavirus 2 by RT PCR NEGATIVE NEGATIVE Final    Comment: (NOTE) SARS-CoV-2 target nucleic acids are NOT DETECTED.  The SARS-CoV-2 RNA is generally detectable in upper respiratory specimens during the acute phase of infection. The lowest concentration of SARS-CoV-2 viral copies this assay can detect is 138 copies/mL. A negative result does not preclude SARS-Cov-2 infection and should not be used as the sole basis for treatment or other patient management decisions. A negative result may occur  with  improper specimen collection/handling, submission of specimen other than nasopharyngeal swab, presence of viral mutation(s) within the areas targeted by this assay, and inadequate number of viral copies(<138 copies/mL). A negative result must be combined with clinical observations, patient history, and epidemiological information. The expected result is Negative.  Fact Sheet for Patients:  EntrepreneurPulse.com.au  Fact Sheet for Healthcare Providers:  IncredibleEmployment.be  This test is no t yet approved or cleared by the [place] FDA and  has been authorized for detection and/or diagnosis of SARS-CoV-2 by FDA under an Emergency Use Authorization (EUA). This EUA will remain  in effect (meaning this test can be used) for the duration of the COVID-19 declaration under Section 564(b)(1) of the Act, 21 U.S.C.section 360bbb-3(b)(1), unless the authorization is terminated  or revoked sooner.       Influenza A by PCR NEGATIVE NEGATIVE Final   Influenza B by PCR NEGATIVE NEGATIVE Final    Comment: (NOTE) The Xpert Xpress SARS-CoV-2/FLU/RSV plus assay is intended as an aid in the diagnosis of influenza from Nasopharyngeal swab specimens and should not be used as a sole  basis for treatment. Nasal washings and aspirates are unacceptable for Xpert Xpress SARS-CoV-2/FLU/RSV testing.  Fact Sheet for Patients: EntrepreneurPulse.com.au  Fact Sheet for Healthcare Providers: IncredibleEmployment.be  This test is not yet approved or cleared by the [place] FDA and has been authorized for detection and/or diagnosis of SARS-CoV-2 by FDA under an Emergency Use Authorization (EUA). This EUA will remain in effect (meaning this test can be used) for the duration of the COVID-19 declaration under Section 564(b)(1) of the Act, 21 U.S.C. section 360bbb-3(b)(1), unless the authorization is terminated  or revoked.  Performed at [facility], [place] [address], [place], [place] [address]   Respiratory (~20 pathogens) panel by PCR     Status: None   Collection Time: 04/01/21  9:49 PM   Specimen: Nasopharyngeal Swab; Respiratory  Result Value Ref Range Status   Adenovirus NOT DETECTED NOT DETECTED Final   Coronavirus 229E NOT DETECTED NOT DETECTED Final    Comment: (NOTE) The Coronavirus on the Respiratory Panel, DOES NOT test for the novel  Coronavirus (2019 nCoV)    Coronavirus HKU1 NOT DETECTED NOT DETECTED Final   Coronavirus NL63 NOT DETECTED NOT DETECTED Final   Coronavirus OC43 NOT DETECTED NOT DETECTED Final   Metapneumovirus NOT DETECTED NOT DETECTED Final   Rhinovirus / Enterovirus NOT DETECTED NOT DETECTED Final   Influenza A NOT DETECTED NOT DETECTED Final   Influenza B NOT DETECTED NOT DETECTED Final   Parainfluenza Virus 1 NOT DETECTED NOT DETECTED Final   Parainfluenza Virus 2 NOT DETECTED NOT DETECTED Final   Parainfluenza Virus 3 NOT DETECTED NOT DETECTED Final   Parainfluenza Virus 4 NOT DETECTED NOT DETECTED Final   Respiratory Syncytial Virus NOT DETECTED NOT DETECTED Final   Bordetella pertussis NOT DETECTED NOT DETECTED Final   Bordetella Parapertussis NOT DETECTED NOT DETECTED Final   Chlamydophila pneumoniae NOT DETECTED NOT DETECTED Final   Mycoplasma pneumoniae NOT DETECTED NOT DETECTED Final    Comment: Performed at [facility], [place]. [address], [place], [place] [address]         Radiology Studies: CT Angio Chest Pulmonary Embolism (PE) W or WO Contrast  Result Date: 04/02/2021 CLINICAL DATA:  Wheezing and shortness of breath. Recent cocaine use. EXAM: CT ANGIOGRAPHY CHEST WITH CONTRAST TECHNIQUE: Multidetector CT imaging of the chest was performed using the standard protocol during bolus administration of intravenous contrast. Multiplanar CT image reconstructions and MIPs were obtained to evaluate the vascular anatomy. CONTRAST:  71mL  OMNIPAQUE IOHEXOL 350 MG/ML SOLN COMPARISON:  02/26/2021 FINDINGS: Cardiovascular: Satisfactory opacification of the pulmonary arteries to the segmental level. No evidence of pulmonary embolism. Normal heart size. No pericardial effusion. Age advanced atherosclerosis of the aorta and coronaries. Mediastinum/Nodes: Negative for adenopathy or mass Lungs/Pleura: Generalized airway thickening patchy reticulation and micro nodularity. The micro nodularity is greatest in the apical lungs, question smoking-related respiratory bronchiolitis. Upper Abdomen: Gastric bypass with persisting gas collection near the gastroenteric anastomosis, most consistent with marginal ulcer. This gaseous collection measures up to 2.7 cm in diameter. No pneumoperitoneum. Musculoskeletal: No acute finding Review of the MIP images confirms the above findings. IMPRESSION: 1. Negative for pulmonary embolism. 2. Bronchitis and bronchiolitis. 3. Gastric bypass with marginal ulcer that is known from prior CTs. Electronically Signed   By: Jorje Guild M.D.   On: 04/02/2021 05:21   DG Chest Port 1 View  Result Date: 04/01/2021 CLINICAL DATA:  Shortness of breath, cough x3 days EXAM: PORTABLE CHEST 1 VIEW COMPARISON:  03/30/2021 FINDINGS:  Lungs are clear.  No pleural effusion or pneumothorax. The heart is normal in size. IMPRESSION: No evidence of acute cardiopulmonary disease. Electronically Signed   By: Julian Hy M.D.   On: 04/01/2021 19:07        Scheduled Meds:  amLODipine  10 mg Oral Daily   ipratropium-albuterol  3 mL Nebulization Q6H   lisinopril  10 mg Oral Daily   methylPREDNISolone (SOLU-MEDROL) injection  120 mg Intravenous Q24H   nicotine  21 mg Transdermal Daily   pantoprazole  40 mg Oral BID   potassium chloride  40 mEq Oral BID   sucralfate  1 g Oral TID WC & HS   Continuous Infusions:   LOS: 0 days    Time spent: 56min    Domenic Polite, MD [facility]   04/02/2021, 11:21 AM

## 2021-04-02 NOTE — Progress Notes (Signed)
Pt BP elevated at 195/115, HR 70 and RR in high 20s.  Pt has clonidine for hypertension q4 hr PRN, not due yet. Pt in no distress, c/o back pain 10/10 and chest pain with cough. Pt medicated.  MD notified about above findings. Will continue to monitor pt.  Since pt in severe pain, will wait and recheck BP per NP. Pt in no distress. BP on recheck elevated, PRN med given.

## 2021-04-02 NOTE — Plan of Care (Signed)

## 2021-04-02 NOTE — Progress Notes (Signed)
MRSA positive in nares standing orders in place. MD made aware.

## 2021-04-03 DIAGNOSIS — J4541 Moderate persistent asthma with (acute) exacerbation: Principal | ICD-10-CM

## 2021-04-03 DIAGNOSIS — R0600 Dyspnea, unspecified: Secondary | ICD-10-CM | POA: Diagnosis not present

## 2021-04-03 LAB — CBC
HCT: 28.8 % — ABNORMAL LOW (ref 36.0–46.0)
Hemoglobin: 9.6 g/dL — ABNORMAL LOW (ref 12.0–15.0)
MCH: 22.1 pg — ABNORMAL LOW (ref 26.0–34.0)
MCHC: 33.3 g/dL (ref 30.0–36.0)
MCV: 66.4 fL — ABNORMAL LOW (ref 80.0–100.0)
Platelets: 306 10*3/uL (ref 150–400)
RBC: 4.34 MIL/uL (ref 3.87–5.11)
RDW: 19.9 % — ABNORMAL HIGH (ref 11.5–15.5)
WBC: 6.9 10*3/uL (ref 4.0–10.5)
nRBC: 0 % (ref 0.0–0.2)

## 2021-04-03 LAB — BASIC METABOLIC PANEL
Anion gap: 7 (ref 5–15)
BUN: 12 mg/dL (ref 6–20)
CO2: 26 mmol/L (ref 22–32)
Calcium: 9 mg/dL (ref 8.9–10.3)
Chloride: 102 mmol/L (ref 98–111)
Creatinine, Ser: 0.75 mg/dL (ref 0.44–1.00)
GFR, Estimated: >60 mL/min (ref >60)
Glucose, Bld: 100 mg/dL — ABNORMAL HIGH (ref 70–99)
Potassium: 3.9 mmol/L (ref 3.5–5.1)
Sodium: 135 mmol/L (ref 135–145)

## 2021-04-03 MED ORDER — LOPERAMIDE HCL 2 MG PO CAPS: 2.0000 mg | ORAL_CAPSULE | ORAL | Status: DC | PRN

## 2021-04-03 MED ORDER — HYDRALAZINE HCL 20 MG/ML IJ SOLN
10.0000 mg | Freq: Once | INTRAMUSCULAR | Status: AC
  Administered 2021-04-03: 10 mg via INTRAVENOUS

## 2021-04-03 MED ORDER — CLONIDINE HCL 0.1 MG PO TABS: 0.1000 mg | ORAL_TABLET | ORAL | Status: DC

## 2021-04-03 MED ORDER — LABETALOL HCL 5 MG/ML IV SOLN
10.0000 mg | Freq: Once | INTRAVENOUS | Status: AC
Start: 2021-04-03 — End: 2021-04-03

## 2021-04-03 MED ORDER — CLONIDINE HCL 0.1 MG PO TABS
0.1000 mg | ORAL_TABLET | Freq: Four times a day (QID) | ORAL | Status: DC
  Filled 2021-04-03: qty 1

## 2021-04-03 MED ORDER — HYDRALAZINE HCL 20 MG/ML IJ SOLN
INTRAMUSCULAR | Status: AC
  Filled 2021-04-03: qty 1

## 2021-04-03 MED ORDER — HYDROXYZINE HCL 25 MG PO TABS
25.0000 mg | ORAL_TABLET | Freq: Four times a day (QID) | ORAL | Status: DC | PRN
  Administered 2021-04-03 – 2021-04-05 (×3): 25 mg via ORAL
  Filled 2021-04-03 (×3): qty 1

## 2021-04-03 MED ORDER — LABETALOL HCL 5 MG/ML IV SOLN
10.0000 mg | INTRAVENOUS | Status: DC | PRN
  Administered 2021-04-03 – 2021-04-05 (×4): 10 mg via INTRAVENOUS
  Filled 2021-04-03 (×4): qty 4

## 2021-04-03 MED ORDER — LABETALOL HCL 5 MG/ML IV SOLN
INTRAVENOUS | Status: AC
  Administered 2021-04-03: 10 mg via INTRAVENOUS
  Filled 2021-04-03: qty 4

## 2021-04-03 MED ORDER — METHYLPREDNISOLONE SODIUM SUCC 40 MG IJ SOLR
40.0000 mg | INTRAMUSCULAR | Status: DC
  Administered 2021-04-03: 40 mg via INTRAVENOUS
  Filled 2021-04-03: qty 1

## 2021-04-03 MED ORDER — CLONIDINE HCL 0.1 MG PO TABS: 0.1000 mg | ORAL_TABLET | Freq: Every day | ORAL | Status: DC

## 2021-04-03 MED ORDER — CLONIDINE HCL 0.1 MG PO TABS
0.1000 mg | ORAL_TABLET | ORAL | Status: DC
  Administered 2021-04-05 – 2021-04-06 (×2): 0.1 mg via ORAL
  Filled 2021-04-03 (×3): qty 1

## 2021-04-03 MED ORDER — ONDANSETRON 4 MG PO TBDP: 4.0000 mg | ORAL_TABLET | Freq: Four times a day (QID) | ORAL | Status: DC | PRN

## 2021-04-03 MED ORDER — PREDNISONE 20 MG PO TABS
40.0000 mg | ORAL_TABLET | Freq: Every day | ORAL | Status: DC
  Administered 2021-04-04 – 2021-04-05 (×2): 40 mg via ORAL
  Filled 2021-04-03 (×2): qty 2

## 2021-04-03 MED ORDER — CLONIDINE HCL 0.1 MG PO TABS
0.1000 mg | ORAL_TABLET | Freq: Four times a day (QID) | ORAL | Status: AC
  Administered 2021-04-03 – 2021-04-05 (×10): 0.1 mg via ORAL
  Filled 2021-04-03 (×9): qty 1

## 2021-04-03 MED ORDER — CLONAZEPAM 0.25 MG PO TBDP
1.0000 mg | ORAL_TABLET | Freq: Three times a day (TID) | ORAL | Status: DC
  Administered 2021-04-03 – 2021-04-04 (×4): 1 mg via ORAL
  Filled 2021-04-03 (×4): qty 4

## 2021-04-03 MED ORDER — HYDRALAZINE HCL 20 MG/ML IJ SOLN
5.0000 mg | Freq: Once | INTRAMUSCULAR | Status: AC
  Administered 2021-04-03: 5 mg via INTRAVENOUS
  Filled 2021-04-03: qty 1

## 2021-04-03 MED ORDER — DICYCLOMINE HCL 20 MG PO TABS
20.0000 mg | ORAL_TABLET | Freq: Four times a day (QID) | ORAL | Status: DC | PRN
  Administered 2021-04-03: 20 mg via ORAL
  Filled 2021-04-03 (×2): qty 1

## 2021-04-03 NOTE — Significant Event (Signed)
Rapid Response Event Note   Reason for Call :  Called originally at [place] with concerns of htn. Pt has been given 0.1 clonidine q4h  and 5mg  hydralazine once tonight for BP and vicodin 2 tab twice  and 50mg  ultram once d/t pt c/o 10/10 in back. Despite these treatments, BP has remained high  Initial Focused Assessment:  Pt lying in bed in no distress, sleeping. Once awake, pt is irritable, anxious/restless, c/o 10/10 pain in her back. Pt has SOB on exertion/speaking. Lungs CTA. Skin warm and dry.   HR-50 while sleeping and 60-80 when awake. SBP-200s, RR-20 when asleep and mid 20s when awake. SpO2-100% on 2L [place], COWS score is 18(moderate)   Interventions:  Hydralazine 10mg  IV x 2 10mg  labetolol IV Robaxin 750mg  PO  Clonidine Detox Protocol started. Plan of Care:  BP 164/92 after interventions. Pt still c/o back pain however she was just given Robaxin. Allow time for robaxin to work. Detox protocol meds ordered and available if needed. Continue to monitor pt closely. Call RRT if further assistance needed.    Event Summary:   MD Notified: Olena Heckle notified by bedside RN and myself, PCCM consulted and came to bedside.  [place], Nikodem Leadbetter Anderson, RN

## 2021-04-03 NOTE — Progress Notes (Signed)
    OVERNIGHT PROGRESS REPORT  Notified by RN for continued Hypertension in spite of PRN medication given, there is concern for withdrawal due to increased BP, restlessness and agitation coupled with history of polysubstance abuse reported as approximately two days prior.  Brief History  This is a 45 year old female who is currently homeless with history of Asthma/COPD Heavy tobacco use Cocaine use Heroin use  Chronic pain Who originally presented to the ED with  wheezing and shortness of breath, she reported intake of cocaine (smoked) a couple of hours prior to this admission. She was recently admitted for UTI, treated with IV ceftriaxone. She was also discharged then on antihypertensives. She does not have primary care Other -severely hypokalemic in ER , also noted microcytic anemia.  NP called for Rapid Response and now formal consult to CCM Team due to escalating care needs and in person findings of Rapid Response/CCM Team .  Formal consult ordered and CCM Team is aware.           Gershon Cull MSNA MSN ACNPC-AG Acute Care Nurse Practitioner [place]

## 2021-04-03 NOTE — Progress Notes (Signed)
PROGRESS NOTE    Terri [name]  GGY:[id number] DOB: Oct 28, 1975 DOA: 04/01/2021 PCP: Pcp, No  Brief Narrative: 49/homeless female with history of asthma/COPD, heavy tobacco abuse, cocaine and heroin abuse presented to the ED with cough wheezing and shortness of breath, she had just smoked cocaine couple of hours prior to admission. -She was recently admitted for UTI, treated with IV ceftriaxone, she was discharged home on antihypertensives. -Does not have a PCP -In the ED she was noted to be severely hypokalemic, also noted to have microcytic anemia -Hospital course complicated by ongoing withdrawals with accelerated hypertension  Assessment & Plan:   COPD/asthma exacerbation -Improving, no wheezing today, transition to prednisone taper  -Continue duo nebs -COVID, influenza and respiratory virus panel negative  Severe iron deficiency anemia B12 deficiency -Known marginal ulcer at the site of gastric bypass -Last endoscopy in 8/22 noted giant ulcer of the gastrojejunal anastomotic site, not actively bleeding then -Hemoglobin ranges from 8-10, will give IV iron tomorrow -Continue PPI -Patient reports having a colonoscopy in the last 5 years, no records in our system  Cocaine, heroin withdrawal -Continue clonidine per protocol -Continue Klonopin today -Antihypertensives, Imodium etc. -Counseled, social worker consult  Severe hypokalemia -Replaced  Accelerated hypertension -Worsened by recent cocaine use and likely component of heroin withdrawals too -Scheduled clonidine -Continue amlodipine and lisinopril -PRN labetalol  Tobacco abuse -Counseled, nicotine patch  Chronic pain -Vicodin resumed, started muscle relaxer  S/p gastric bypass surgery -Not taking supplemental multi vitamins  Severe protein calorie malnutrition -add supplements  DVT prophylaxis: SCDs Code Status: Full Code Family Communication: Friend at bedside yesterday Disposition Plan:  Status is:  Observation  The patient will require care spanning > 2 midnights and should be moved to inpatient because: COPD exacerbation, accelerated hypertension, severe hypokalemia and anemia   Procedures:   Antimicrobials:    Subjective: -Complains of pain all over, upset about not getting IV pain medicines, had diarrhea overnight, trying to get some sleep  Objective: Vitals:   04/03/21 0730 04/03/21 0800 04/03/21 0853 04/03/21 0954  BP: (!) 177/93 (!) 196/128 140/88 (!) 166/101  Pulse: 60 91 81   Resp: (!) 30  (!) 24   Temp:      TempSrc:      SpO2: 100% 99% 99%   Weight:      Height:        Intake/Output Summary (Last 24 hours) at 04/03/2021 1011 Last data filed at 04/02/2021 2300 Gross per 24 hour  Intake 480 ml  Output 1050 ml  Net -570 ml   Filed Weights   04/01/21 1834  Weight: 52.2 kg    Examination:  General exam: Chronically ill cachectic female laying in bed, awake alert oriented x3, anxious, restless CVS: S1-S2, regular rhythm, tachycardic Lungs: Poor air movement bilaterally, scattered expiratory wheezes Abdomen: Soft, nontender, scaphoid, bowel sounds present Extremities: No edema Skin: No rash on exposed skin Psychiatry: Mood & affect appropriate.     Data Reviewed:   CBC: Recent Labs  Lab 03/31/21 0525 04/01/21 1921 04/02/21 0115 04/03/21 0146  WBC 9.9 7.2 6.7 6.9  NEUTROABS  --  5.3 6.0  --   HGB 10.6* 9.2* 9.7* 9.6*  HCT 32.2* 28.2* 29.8* 28.8*  MCV 66.3* 66.4* 67.1* 66.4*  PLT 377 327 342 035   Basic Metabolic Panel: Recent Labs  Lab 03/31/21 0525 04/01/21 1921 04/02/21 0115 04/03/21 0146  NA 131* 135 135 135  K 2.6* 2.2* 2.8* 3.9  CL 95* 96* 98 102  CO2 27 27 25 26   GLUCOSE 127* 102* 111* 100*  BUN 7 13 13 12   CREATININE 0.52 0.97 0.95 0.75  CALCIUM 8.7* 8.5* 8.7* 9.0  MG  --  1.8 1.8  --   PHOS  --   --  3.0  --    GFR: Estimated Creatinine Clearance: 71.6 mL/min (by C-G formula based on SCr of 0.75 mg/dL). Liver  Function Tests: Recent Labs  Lab 04/02/21 0115  AST 17  ALT 11  ALKPHOS 91  BILITOT 0.7  PROT 6.4*  ALBUMIN 3.0*   Recent Labs  Lab 04/02/21 0230  LIPASE 38   No results for input(s): AMMONIA in the last 168 hours. Coagulation Profile: No results for input(s): INR, PROTIME in the last 168 hours. Cardiac Enzymes: Recent Labs  Lab 04/02/21 0055  CKTOTAL 50   BNP (last 3 results) No results for input(s): PROBNP in the last 8760 hours. HbA1C: No results for input(s): HGBA1C in the last 72 hours. CBG: Recent Labs  Lab 04/01/21 1822  GLUCAP 96   Lipid Profile: No results for input(s): CHOL, HDL, LDLCALC, TRIG, CHOLHDL, LDLDIRECT in the last 72 hours. Thyroid Function Tests: Recent Labs    04/02/21 0115  TSH 0.655   Anemia Panel: Recent Labs    04/02/21 0115  VITAMINB12 204  FOLATE 9.0  FERRITIN 10*  TIBC 382  IRON 10*  RETICCTPCT 1.3   Urine analysis:    Component Value Date/Time   COLORURINE YELLOW 04/01/2021 2150   APPEARANCEUR CLEAR 04/01/2021 2150   LABSPEC 1.006 04/01/2021 2150   PHURINE 6.0 04/01/2021 2150   GLUCOSEU NEGATIVE 04/01/2021 2150   HGBUR NEGATIVE 04/01/2021 2150   BILIRUBINUR NEGATIVE 04/01/2021 2150   KETONESUR NEGATIVE 04/01/2021 2150   PROTEINUR NEGATIVE 04/01/2021 2150   UROBILINOGEN 1.0 02/09/2015 1228   NITRITE NEGATIVE 04/01/2021 2150   LEUKOCYTESUR NEGATIVE 04/01/2021 2150   Sepsis Labs: @LABRCNTIP (procalcitonin:4,lacticidven:4)  ) Recent Results (from the past 240 hour(s))  Resp Panel by RT-PCR (Flu A&B, Covid) Nasopharyngeal Swab     Status: None   Collection Time: 04/01/21  6:48 PM   Specimen: Nasopharyngeal Swab; Nasopharyngeal(NP) swabs in vial transport medium  Result Value Ref Range Status   SARS Coronavirus 2 by RT PCR NEGATIVE NEGATIVE Final    Comment: (NOTE) SARS-CoV-2 target nucleic acids are NOT DETECTED.  The SARS-CoV-2 RNA is generally detectable in upper respiratory specimens during the acute phase  of infection. The lowest concentration of SARS-CoV-2 viral copies this assay can detect is 138 copies/mL. A negative result does not preclude SARS-Cov-2 infection and should not be used as the sole basis for treatment or other patient management decisions. A negative result may occur with  improper specimen collection/handling, submission of specimen other than nasopharyngeal swab, presence of viral mutation(s) within the areas targeted by this assay, and inadequate number of viral copies(<138 copies/mL). A negative result must be combined with clinical observations, patient history, and epidemiological information. The expected result is Negative.  Fact Sheet for Patients:  EntrepreneurPulse.com.au  Fact Sheet for Healthcare Providers:  IncredibleEmployment.be  This test is no t yet approved or cleared by the [place] FDA and  has been authorized for detection and/or diagnosis of SARS-CoV-2 by FDA under an Emergency Use Authorization (EUA). This EUA will remain  in effect (meaning this test can be used) for the duration of the COVID-19 declaration under Section 564(b)(1) of the Act, 21 U.S.C.section 360bbb-3(b)(1), unless the authorization is terminated  or revoked sooner.  Influenza A by PCR NEGATIVE NEGATIVE Final   Influenza B by PCR NEGATIVE NEGATIVE Final    Comment: (NOTE) The Xpert Xpress SARS-CoV-2/FLU/RSV plus assay is intended as an aid in the diagnosis of influenza from Nasopharyngeal swab specimens and should not be used as a sole basis for treatment. Nasal washings and aspirates are unacceptable for Xpert Xpress SARS-CoV-2/FLU/RSV testing.  Fact Sheet for Patients: EntrepreneurPulse.com.au  Fact Sheet for Healthcare Providers: IncredibleEmployment.be  This test is not yet approved or cleared by the [place] FDA and has been authorized for detection and/or diagnosis of  SARS-CoV-2 by FDA under an Emergency Use Authorization (EUA). This EUA will remain in effect (meaning this test can be used) for the duration of the COVID-19 declaration under Section 564(b)(1) of the Act, 21 U.S.C. section 360bbb-3(b)(1), unless the authorization is terminated or revoked.  Performed at [facility], [place] [address] [place], [place] [address]   Respiratory (~20 pathogens) panel by PCR     Status: None   Collection Time: 04/01/21  9:49 PM   Specimen: Nasopharyngeal Swab; Respiratory  Result Value Ref Range Status   Adenovirus NOT DETECTED NOT DETECTED Final   Coronavirus 229E NOT DETECTED NOT DETECTED Final    Comment: (NOTE) The Coronavirus on the Respiratory Panel, DOES NOT test for the novel  Coronavirus (2019 nCoV)    Coronavirus HKU1 NOT DETECTED NOT DETECTED Final   Coronavirus NL63 NOT DETECTED NOT DETECTED Final   Coronavirus OC43 NOT DETECTED NOT DETECTED Final   Metapneumovirus NOT DETECTED NOT DETECTED Final   Rhinovirus / Enterovirus NOT DETECTED NOT DETECTED Final   Influenza A NOT DETECTED NOT DETECTED Final   Influenza B NOT DETECTED NOT DETECTED Final   Parainfluenza Virus 1 NOT DETECTED NOT DETECTED Final   Parainfluenza Virus 2 NOT DETECTED NOT DETECTED Final   Parainfluenza Virus 3 NOT DETECTED NOT DETECTED Final   Parainfluenza Virus 4 NOT DETECTED NOT DETECTED Final   Respiratory Syncytial Virus NOT DETECTED NOT DETECTED Final   Bordetella pertussis NOT DETECTED NOT DETECTED Final   Bordetella Parapertussis NOT DETECTED NOT DETECTED Final   Chlamydophila pneumoniae NOT DETECTED NOT DETECTED Final   Mycoplasma pneumoniae NOT DETECTED NOT DETECTED Final    Comment: Performed at [facility], [place]. [address] [place], [place] [address]  Culture, blood (Routine X 2) w Reflex to ID Panel     Status: None (Preliminary result)   Collection Time: 04/02/21  1:15 AM   Specimen: BLOOD  Result Value Ref Range Status   Specimen Description  BLOOD SITE NOT SPECIFIED  Final   Special Requests AEROBIC BOTTLE ONLY Blood Culture adequate volume  Final   Culture   Final    NO GROWTH 1 DAY Performed at [facility], [place] [address] [place], [place] [address]    Report Status PENDING  Incomplete  Culture, blood (Routine X 2) w Reflex to ID Panel     Status: None (Preliminary result)   Collection Time: 04/02/21  1:15 AM   Specimen: BLOOD  Result Value Ref Range Status   Specimen Description BLOOD SITE NOT SPECIFIED  Final   Special Requests AEROBIC BOTTLE ONLY Blood Culture adequate volume  Final   Culture   Final    NO GROWTH 1 DAY Performed at [facility], [place] [address] [place], [place] [address]    Report Status PENDING  Incomplete  MRSA Next Gen by PCR, Nasal     Status: Abnormal  Collection Time: 04/02/21  2:22 PM   Specimen: Nasal Mucosa; Nasal Swab  Result Value Ref Range Status   MRSA by PCR Next Gen DETECTED (A) NOT DETECTED Final    Comment: RESULT CALLED TO, READ BACK BY AND VERIFIED WITH: [facility] PUGH RN 04/02/21 @1734  BY JW (NOTE) The GeneXpert MRSA Assay (FDA approved for NASAL specimens only), is one component of a comprehensive MRSA colonization surveillance program. It is not intended to diagnose MRSA infection nor to guide or monitor treatment for MRSA infections. Test performance is not FDA approved in patients less than 61 years old. Performed at [facility], [place] [address] [place], [place] [address]   Expectorated Sputum Assessment w Gram Stain, Rflx to Resp Cult     Status: None   Collection Time: 04/02/21  2:28 PM   Specimen: Expectorated Sputum  Result Value Ref Range Status   Specimen Description EXPECTORATED SPUTUM  Final   Special Requests NONE  Final   Sputum evaluation   Final    THIS SPECIMEN IS ACCEPTABLE FOR SPUTUM CULTURE Performed at [facility], [place] [address], [place], [place] [address]    Report Status 04/02/2021 FINAL  Final  Culture, Respiratory w  Gram Stain     Status: None (Preliminary result)   Collection Time: 04/02/21  2:28 PM  Result Value Ref Range Status   Specimen Description EXPECTORATED SPUTUM  Final   Special Requests NONE Reflexed from [id number]  Final   Gram Stain   Final    ABUNDANT WBC PRESENT,BOTH PMN AND MONONUCLEAR ABUNDANT GRAM NEGATIVE COCCOBACILLI FEW GRAM POSITIVE COCCI    Culture   Final    CULTURE REINCUBATED FOR BETTER GROWTH Performed at [facility], [place] [address] [place], [place] [address]    Report Status PENDING  Incomplete         Radiology Studies: CT Angio Chest Pulmonary Embolism (PE) W or WO Contrast  Result Date: 04/02/2021 CLINICAL DATA:  Wheezing and shortness of breath. Recent cocaine use. EXAM: CT ANGIOGRAPHY CHEST WITH CONTRAST TECHNIQUE: Multidetector CT imaging of the chest was performed using the standard protocol during bolus administration of intravenous contrast. Multiplanar CT image reconstructions and MIPs were obtained to evaluate the vascular anatomy. CONTRAST:  44mL OMNIPAQUE IOHEXOL 350 MG/ML SOLN COMPARISON:  02/26/2021 FINDINGS: Cardiovascular: Satisfactory opacification of the pulmonary arteries to the segmental level. No evidence of pulmonary embolism. Normal heart size. No pericardial effusion. Age advanced atherosclerosis of the aorta and coronaries. Mediastinum/Nodes: Negative for adenopathy or mass Lungs/Pleura: Generalized airway thickening patchy reticulation and micro nodularity. The micro nodularity is greatest in the apical lungs, question smoking-related respiratory bronchiolitis. Upper Abdomen: Gastric bypass with persisting gas collection near the gastroenteric anastomosis, most consistent with marginal ulcer. This gaseous collection measures up to 2.7 cm in diameter. No pneumoperitoneum. Musculoskeletal: No acute finding Review of the MIP images confirms the above findings. IMPRESSION: 1. Negative for pulmonary embolism. 2. Bronchitis and bronchiolitis. 3.  Gastric bypass with marginal ulcer that is known from prior CTs. Electronically Signed   By: Jorje Guild M.D.   On: 04/02/2021 05:21   DG Chest Port 1 View  Result Date: 04/01/2021 CLINICAL DATA:  Shortness of breath, cough x3 days EXAM: PORTABLE CHEST 1 VIEW COMPARISON:  03/30/2021 FINDINGS: Lungs are clear.  No pleural effusion or pneumothorax. The heart is normal in size. IMPRESSION: No evidence of acute cardiopulmonary disease. Electronically Signed   By: Julian Hy M.D.   On: 04/01/2021 19:07  Scheduled Meds:  amLODipine  10 mg Oral Daily   Chlorhexidine Gluconate Cloth  6 each Topical Q0600   clonazepam  1 mg Oral TID   cloNIDine  0.1 mg Oral QID   Followed by   Derrill Memo ON 04/05/2021] cloNIDine  0.1 mg Oral BH-qamhs   Followed by   Derrill Memo ON 04/08/2021] cloNIDine  0.1 mg Oral QAC breakfast   ipratropium-albuterol  3 mL Nebulization BID   lidocaine  1 patch Transdermal Q24H   lisinopril  10 mg Oral Daily   methylPREDNISolone (SOLU-MEDROL) injection  40 mg Intravenous Q24H   mupirocin ointment  1 application Nasal BID   nicotine  21 mg Transdermal Daily   pantoprazole  40 mg Oral BID   sucralfate  1 g Oral TID WC & HS   Continuous Infusions:   LOS: 1 day    Time spent: 6min    Domenic Polite, MD [facility]   04/03/2021, 10:11 AM

## 2021-04-03 NOTE — Progress Notes (Signed)
Pt asleep, when awake she is anxious and agitated. BP still elevated. Olena Heckle, NP notified, new order for hydralazine placed if SBP sustained above 180.

## 2021-04-03 NOTE — Consult Note (Signed)
NAME:  DIARA CHAUDHARI, MRN:  [id number], DOB:  01/23/1976, LOS: 1 ADMISSION DATE:  04/01/2021, CONSULTATION DATE:  04/03/2021 REFERRING MD: Dr. Jacinta Shoe, CHIEF COMPLAINT: Uncontrolled hypertension  History of Present Illness:  This is a 45 year old female that presented to the emergency room on 11 /01/2021.  She presented with cough and wheezing.  She was short of breath at that time.  She had been using cocaine just prior to time of admission.  She is approximately 48 hours out of using heroin.  She started with hypertension generalized muscle aches and intermittent confusion.  Consult today for uncontrolled hypertension.  Patient had been given some clonidine and small dose of hydralazine.  Pertinent  Medical History  Illicit drug use of cocaine and heroin Asthma Heavy tobacco abuse Hypertension  Significant Hospital Events: Including procedures, antibiotic start and stop dates in addition to other pertinent events     Interim History / Subjective:  Blood pressures gotten worse during the hospital stay  Objective   Blood pressure (!) 227/101, pulse 64, temperature 98.6 F (37 C), temperature source Oral, resp. rate (!) 25, height 5\' 9"  (1.753 m), weight 52.2 kg, SpO2 100 %.        Intake/Output Summary (Last 24 hours) at 04/03/2021 0418 Last data filed at 04/02/2021 2300 Gross per 24 hour  Intake 480 ml  Output 1050 ml  Net -570 ml   Filed Weights   04/01/21 1834  Weight: 52.2 kg    Examination: General: No acute distress HENT: Atraumatic/normocephalic mucous membranes are moist. Lungs: Clear to auscultation no wheezing rales or rhonchi noted Cardiovascular: Regular rate.  No murmur or rub Abdomen: Soft, and nontender, nondistended, no rebound/rigidity/guarding. Extremities: No edema or cyanosis.  Distal pulse intact x4. Neuro: Patient was sleeping but easily awoken to follow commands.  No focal neurologic deficits.  When awake she is awake and appropriate.   Resolved  Hospital Problem list   None  Assessment & Plan:  Uncontrolled hypertension Narcotic withdrawal Illicit drug use cocaine and heroin Asthma Iron deficiency anemia  Plan: Will give hydralazine 20 mg IV x1 now.  We will also give labetalol 10 mg IV x1 now.  These can be used on a every 6 hour basis. Would continue the current withdrawal regimen has been initiated. Lungs are currently clear we will wean steroids.  Continue the amlodipine and lisinopril p.o. Monitor I's/O's.   Best Practice (right click and "Reselect all SmartList Selections" daily)   Diet/type: Regular consistency (see orders) DVT prophylaxis:  GI prophylaxis: N/A Lines: N/A Foley:  N/A Code Status:  full code Last date of multidisciplinary goals of care discussion []   Labs   CBC: Recent Labs  Lab 03/31/21 0525 04/01/21 1921 04/02/21 0115 04/03/21 0146  WBC 9.9 7.2 6.7 6.9  NEUTROABS  --  5.3 6.0  --   HGB 10.6* 9.2* 9.7* 9.6*  HCT 32.2* 28.2* 29.8* 28.8*  MCV 66.3* 66.4* 67.1* 66.4*  PLT 377 327 342 811    Basic Metabolic Panel: Recent Labs  Lab 03/31/21 0525 04/01/21 1921 04/02/21 0115 04/03/21 0146  NA 131* 135 135 135  K 2.6* 2.2* 2.8* 3.9  CL 95* 96* 98 102  CO2 27 27 25 26   GLUCOSE 127* 102* 111* 100*  BUN 7 13 13 12   CREATININE 0.52 0.97 0.95 0.75  CALCIUM 8.7* 8.5* 8.7* 9.0  MG  --  1.8 1.8  --   PHOS  --   --  3.0  --  GFR: Estimated Creatinine Clearance: 71.6 mL/min (by C-G formula based on SCr of 0.75 mg/dL). Recent Labs  Lab 03/31/21 0525 04/01/21 1921 04/02/21 0115 04/02/21 0230 04/03/21 0146  WBC 9.9 7.2 6.7  --  6.9  LATICACIDVEN  --   --  3.4* 1.9  --     Liver Function Tests: Recent Labs  Lab 04/02/21 0115  AST 17  ALT 11  ALKPHOS 91  BILITOT 0.7  PROT 6.4*  ALBUMIN 3.0*   Recent Labs  Lab 04/02/21 0230  LIPASE 38   No results for input(s): AMMONIA in the last 168 hours.  ABG    Component Value Date/Time   HCO3 27.7 04/02/2021 0230   O2SAT  92.3 04/02/2021 0230     Coagulation Profile: No results for input(s): INR, PROTIME in the last 168 hours.  Cardiac Enzymes: Recent Labs  Lab 04/02/21 0055  CKTOTAL 50    HbA1C: Hgb A1c MFr Bld  Date/Time Value Ref Range Status  02/03/2019 05:15 AM 5.2 4.8 - 5.6 % Final    Comment:    (NOTE)         Prediabetes: 5.7 - 6.4         Diabetes: >6.4         Glycemic control for adults with diabetes: <7.0     CBG: Recent Labs  Lab 04/01/21 1822  GLUCAP 96      Past Medical History:  She,  has a past medical history of Arthritis, Bipolar disorder ([place]), Chronic back pain, Depression, DVT (deep venous thrombosis) ([place]), GIB (gastrointestinal bleeding) (09/13/2017), History of blood transfusion, Hypertension, Migraine headache, Neuropathy, Pneumonia, Positive PPD, Sciatica, and Stress incontinence.   Surgical History:   Past Surgical History:  Procedure Laterality Date   ALVEOLOPLASTY Bilateral 01/31/2018   Procedure: ALVEOLOPLASTY;  Surgeon: Diona Browner, DDS;  Location: [facility];  Service: Oral Surgery;  Laterality: Bilateral;   ANKLE SURGERY Left 1992   with hardware   BACK SURGERY     BIOPSY  02/08/2019   Procedure: BIOPSY;  Surgeon: Rush Landmark Telford Nab., MD;  Location: [facility];  Service: Gastroenterology;;   CESAREAN SECTION     CHOLECYSTECTOMY N/A 02/15/2017   Procedure: LAPAROSCOPIC CHOLECYSTECTOMY;  Surgeon: Clovis Riley, MD;  Location: [facility];  Service: General;  Laterality: N/A;   ENTEROSCOPY N/A 02/08/2019   Procedure: ENTEROSCOPY;  Surgeon: Rush Landmark Telford Nab., MD;  Location: [facility];  Service: Gastroenterology;  Laterality: N/A;   ESOPHAGOGASTRODUODENOSCOPY N/A 01/17/2021   Procedure: ESOPHAGOGASTRODUODENOSCOPY (EGD);  Surgeon: Juanita Craver, MD;  Location: [facility];  Service: Endoscopy;  Laterality: N/A;   ESOPHAGOGASTRODUODENOSCOPY (EGD) WITH PROPOFOL N/A 09/15/2017   Procedure: ESOPHAGOGASTRODUODENOSCOPY (EGD) WITH PROPOFOL;  Surgeon:  Clarene Essex, MD;  [facility];  Service: Endoscopy;  Laterality: N/A;   ESOPHAGOGASTRODUODENOSCOPY (EGD) WITH PROPOFOL N/A 02/08/2019   Procedure: ESOPHAGOGASTRODUODENOSCOPY (EGD) WITH PROPOFOL;  Surgeon: Rush Landmark Telford Nab., MD;  Location: [facility];  Service: Gastroenterology;  Laterality: N/A;   GASTRIC BYPASS     INCISION AND DRAINAGE OF PERITONSILLAR ABCESS Left 08/13/2017   Procedure: INCISION AND DRAINAGE OF LEFT NECK ABSCESS;  Surgeon: Melida Quitter, MD;  Location: [facility];  Service: ENT;  Laterality: Left;   LAPAROSCOPIC LYSIS OF ADHESIONS  02/15/2017   Procedure: LAPAROSCOPIC LYSIS OF ADHESIONS;  Surgeon: Clovis Riley, MD;  Location[facility];  Service: General;;   LUMBAR DISC SURGERY     LUMBAR FUSION     SAVORY DILATION N/A 02/08/2019   Procedure: SAVORY DILATION;  Surgeon: Irving Copas., MD;  Location: [facility];  Service: Gastroenterology;  Laterality: N/A;   TOOTH EXTRACTION Bilateral 01/31/2018   Procedure: DENTAL RESTORATION/EXTRACTIONS;  Surgeon: Diona Browner, DDS;  Location: [facility];  Service: Oral Surgery;  Laterality: Bilateral;   TUBAL LIGATION     UPPER GI ENDOSCOPY N/A 02/15/2017   Procedure: UPPER GI ENDOSCOPY;  Surgeon: Clovis Riley, MD;  Location: [facility];  Service: General;  Laterality: N/A;     Social History:   reports that she has been smoking cigarettes. She has a 29.00 pack-year smoking history. She has never used smokeless tobacco. She reports current drug use. Drugs: IV and Cocaine. She reports that she does not drink alcohol.   Family History:  Her family history includes Diabetes in her father and mother; Hypertension in her father and mother.   Allergies Allergies  Allergen Reactions   Nsaids Other (See Comments)    Stomach ulcer    Ketorolac Tromethamine Itching and Nausea And Vomiting   Methocarbamol Diarrhea     Home Medications  Prior to Admission medications   Medication Sig Start Date End Date Taking?  Authorizing Provider  amLODipine (NORVASC) 10 MG tablet Take 1 tablet (10 mg total) by mouth daily. Patient not taking: Reported on 04/01/2021 03/19/21   Edwin Dada, MD  hydrochlorothiazide (HYDRODIURIL) 25 MG tablet Take 1 tablet (25 mg total) by mouth daily. Patient not taking: Reported on 04/01/2021 03/19/21   Edwin Dada, MD  lisinopril (ZESTRIL) 10 MG tablet Take 1 tablet (10 mg total) by mouth daily. Patient not taking: Reported on 04/01/2021 03/19/21   Edwin Dada, MD  pantoprazole (PROTONIX) 40 MG tablet Take 1 tablet (40 mg total) by mouth daily. Patient not taking: Reported on 04/01/2021 03/18/21   Edwin Dada, MD  sucralfate (CARAFATE) 1 GM/10ML suspension Take 10 mLs (1 g total) by mouth 4 (four) times daily -  with meals and at bedtime. Patient not taking: Reported on 04/01/2021 03/18/21   Edwin Dada, MD     Critical care time: 35 minutes

## 2021-04-03 NOTE — Progress Notes (Signed)
BP elevated 177/93, MD notified. Scheduled med given to pt.

## 2021-04-03 NOTE — Progress Notes (Signed)
Hydralazine PRN given, catapres given, pt BP still elevated. Manual SBP in 190s. Olena Heckle, NP made aware. Pt asleep, when awake continue to be restless, anxious and agitated. This RN concerned about withdrawal, Rapid response RN called. RR nurse at the bedside assessing pt. Olena Heckle, NP aware about above findings.  Will continue to monitor pt.

## 2021-04-04 LAB — CULTURE, RESPIRATORY W GRAM STAIN

## 2021-04-04 MED ORDER — HYDROCODONE-ACETAMINOPHEN 5-325 MG PO TABS
1.0000 | ORAL_TABLET | Freq: Four times a day (QID) | ORAL | Status: DC | PRN
  Administered 2021-04-04 – 2021-04-06 (×6): 2 via ORAL
  Filled 2021-04-04 (×6): qty 2

## 2021-04-04 MED ORDER — LORAZEPAM 1 MG PO TABS
1.0000 mg | ORAL_TABLET | Freq: Four times a day (QID) | ORAL | Status: DC | PRN
  Administered 2021-04-05 (×3): 1 mg via ORAL
  Filled 2021-04-04 (×3): qty 1

## 2021-04-04 MED ORDER — CYANOCOBALAMIN 1000 MCG/ML IJ SOLN
1000.0000 ug | Freq: Once | INTRAMUSCULAR | Status: AC
  Administered 2021-04-04: 1000 ug via INTRAMUSCULAR
  Filled 2021-04-04 (×2): qty 1

## 2021-04-04 MED ORDER — CLONAZEPAM 0.5 MG PO TABS
1.0000 mg | ORAL_TABLET | Freq: Once | ORAL | Status: AC
Start: 2021-04-04 — End: 2021-04-04
  Administered 2021-04-04: 1 mg via ORAL

## 2021-04-04 MED ORDER — CLONAZEPAM 0.25 MG PO TBDP
1.0000 mg | ORAL_TABLET | Freq: Two times a day (BID) | ORAL | Status: DC
  Administered 2021-04-04 – 2021-04-06 (×4): 1 mg via ORAL
  Filled 2021-04-04 (×5): qty 4

## 2021-04-04 MED ORDER — LORAZEPAM 2 MG/ML IJ SOLN
INTRAMUSCULAR | Status: AC
  Administered 2021-04-04: 1 mg
  Filled 2021-04-04: qty 1

## 2021-04-04 MED ORDER — HYDRALAZINE HCL 25 MG PO TABS
25.0000 mg | ORAL_TABLET | Freq: Three times a day (TID) | ORAL | Status: DC
  Administered 2021-04-04 (×3): 25 mg via ORAL
  Filled 2021-04-04 (×3): qty 1

## 2021-04-04 MED ORDER — SODIUM CHLORIDE 0.9 % IV SOLN
250.0000 mg | Freq: Every day | INTRAVENOUS | Status: AC
  Administered 2021-04-04 – 2021-04-05 (×2): 250 mg via INTRAVENOUS
  Filled 2021-04-04 (×3): qty 20

## 2021-04-04 NOTE — Significant Event (Signed)
Rapid Response Event Note   Reason for Call :  Tachypnea  Initial Focused Assessment:  I was called to come lay eyes on this patient. They called me because she was tachypnic at rest. It was reported that all of her other vital signs were stable. When I asked her more questions whether this was related to her withdrawal she made it very clear that she had no intention in talking to me further. I personally feel that this is all part of her withdrawal.   There was a situation today where this patient was brought "some stuff" by a visitor, went into the bathroom, and came out hypertensive and sleepy. I conferred with the [facility] and we agree that moving forward she not be allowed any visitors. She can communicate via telephone to her support persons.    Event Summary:   MD Notified: Dr. Broadus John Call Time: [place] Time: [id number] End Time: [place], RN

## 2021-04-04 NOTE — Progress Notes (Signed)
RN called for PRN breathing tx.  PRN tx given, BBSH clear w/ good air movement t/o, slight diminished in bases.  Sat 100% on 3 lpm [place].  Noted pt w/ tachypnea, and pt states she doesn't feel she's getting "enough air". Pt also c/o chest pain and back pain.  I notified RN, RN came and assessed pt and called Rapid Response RN, and RN notified MD.  EKG was ordered, and Rapid Response RN assessed pt.  Pt became very upset, stating she is going through withdraw and "no one helps her", pt also stated that she felt her shortness of breath was from withdraw.  Per discussions w/ RN's, no further RT needs at this time.  VSS, sat  97-98% on 3 lpm.

## 2021-04-04 NOTE — Progress Notes (Signed)
PROGRESS NOTE    SISSI PADIA  RDE:[id number] DOB: 1976-02-12 DOA: 04/01/2021 PCP: Pcp, No  Brief Narrative: 21/homeless female with history of asthma/COPD, heavy tobacco abuse, cocaine and heroin abuse presented to the ED with cough wheezing and shortness of breath, she had just smoked cocaine couple of hours prior to admission. -She was recently admitted for UTI, treated with IV ceftriaxone, she was discharged home on antihypertensives. -Does not have a PCP -In the ED she was noted to be severely hypokalemic, also noted to have microcytic anemia -Hospital course complicated by ongoing withdrawals with accelerated hypertension -11/11pm: staff witnessed patient calling friends to bring her some stuff, 11/12 am, 65min after friend visited, her blood pressure shot up to 200 was also noted to be more sleepy  Assessment & Plan:   COPD/asthma exacerbation -Improving, no wheezing anymore, transition to prednisone taper  -Continue duo nebs -COVID, influenza and respiratory virus panel negative  Severe iron deficiency anemia B12 deficiency -Known marginal ulcer at the site of gastric bypass -Last endoscopy in 8/22 noted giant ulcer of the gastrojejunal anastomotic site, not actively bleeding then -Hemoglobin ranges from 8-10, will give IV iron -Continue PPI -Patient reports having a colonoscopy in the last 5 years, no records in our system -Vitamin B-12 IM today, followed by p.o.  Cocaine, heroin withdrawal Polysubstance abuse Homelessness -Continue clonidine per protocol -Continued on Klonopin, reportedly takes this chronically -Antihypertensives, Imodium etc. -Counseled, social worker consult  Severe hypokalemia -Replaced  Accelerated hypertension -Worsened by recent cocaine use and likely component of heroin withdrawals too -Scheduled clonidine -Continue amlodipine and lisinopril -PRN labetalol  Tobacco abuse -Counseled, nicotine patch  Chronic pain -Vicodin resumed,  started muscle relaxer  S/p gastric bypass surgery -Not taking supplemental multi vitamins  Severe protein calorie malnutrition -add supplements  DVT prophylaxis: SCDs Code Status: Full Code Family Communication: Friend at bedside yesterday Disposition Plan:  Status is: inpatient because: COPD exacerbation, accelerated hypertension, severe hypokalemia and anemia   Procedures:   Antimicrobials:    Subjective: -Continues to complain of pain all over, had some diarrhea yesterday  Objective: Vitals:   04/04/21 0500 04/04/21 0600 04/04/21 0856 04/04/21 1005  BP: (!) 162/89 (!) 178/109  (!) 144/117  Pulse: 72 66    Resp:      Temp:      TempSrc:      SpO2: 100% 100% 97%   Weight:      Height:        Intake/Output Summary (Last 24 hours) at 04/04/2021 1120 Last data filed at 04/04/2021 0900 Gross per 24 hour  Intake 240 ml  Output 540 ml  Net -300 ml   Filed Weights   04/01/21 1834  Weight: 52.2 kg    Examination:  General exam: Chronically ill cachectic female laying in bed, sleepy but easily arousable, AAOx3, no distress CVS: S1-S2, regular rhythm, tachycardic Lungs: Poor air movement bilaterally, no wheezes noted Abdomen: Soft, scaphoid, nontender, bowel sounds present Extremities: No edema  Skin: No rash on exposed skin Psychiatry: Mood & affect appropriate.     Data Reviewed:   CBC: Recent Labs  Lab 03/31/21 0525 04/01/21 1921 04/02/21 0115 04/03/21 0146  WBC 9.9 7.2 6.7 6.9  NEUTROABS  --  5.3 6.0  --   HGB 10.6* 9.2* 9.7* 9.6*  HCT 32.2* 28.2* 29.8* 28.8*  MCV 66.3* 66.4* 67.1* 66.4*  PLT 377 327 342 631   Basic Metabolic Panel: Recent Labs  Lab 03/31/21 0525 04/01/21 1921 04/02/21 0115 04/03/21 0146  NA 131* 135 135 135  K 2.6* 2.2* 2.8* 3.9  CL 95* 96* 98 102  CO2 27 27 25 26   GLUCOSE 127* 102* 111* 100*  BUN 7 13 13 12   CREATININE 0.52 0.97 0.95 0.75  CALCIUM 8.7* 8.5* 8.7* 9.0  MG  --  1.8 1.8  --   PHOS  --   --  3.0  --     GFR: Estimated Creatinine Clearance: 71.6 mL/min (by C-G formula based on SCr of 0.75 mg/dL). Liver Function Tests: Recent Labs  Lab 04/02/21 0115  AST 17  ALT 11  ALKPHOS 91  BILITOT 0.7  PROT 6.4*  ALBUMIN 3.0*   Recent Labs  Lab 04/02/21 0230  LIPASE 38   No results for input(s): AMMONIA in the last 168 hours. Coagulation Profile: No results for input(s): INR, PROTIME in the last 168 hours. Cardiac Enzymes: Recent Labs  Lab 04/02/21 0055  CKTOTAL 50   BNP (last 3 results) No results for input(s): PROBNP in the last 8760 hours. HbA1C: No results for input(s): HGBA1C in the last 72 hours. CBG: Recent Labs  Lab 04/01/21 1822  GLUCAP 96   Lipid Profile: No results for input(s): CHOL, HDL, LDLCALC, TRIG, CHOLHDL, LDLDIRECT in the last 72 hours. Thyroid Function Tests: Recent Labs    04/02/21 0115  TSH 0.655   Anemia Panel: Recent Labs    04/02/21 0115  VITAMINB12 204  FOLATE 9.0  FERRITIN 10*  TIBC 382  IRON 10*  RETICCTPCT 1.3   Urine analysis:    Component Value Date/Time   COLORURINE YELLOW 04/01/2021 2150   APPEARANCEUR CLEAR 04/01/2021 2150   LABSPEC 1.006 04/01/2021 2150   PHURINE 6.0 04/01/2021 2150   GLUCOSEU NEGATIVE 04/01/2021 2150   HGBUR NEGATIVE 04/01/2021 2150   BILIRUBINUR NEGATIVE 04/01/2021 2150   KETONESUR NEGATIVE 04/01/2021 2150   PROTEINUR NEGATIVE 04/01/2021 2150   UROBILINOGEN 1.0 02/09/2015 1228   NITRITE NEGATIVE 04/01/2021 2150   LEUKOCYTESUR NEGATIVE 04/01/2021 2150   Sepsis Labs: @LABRCNTIP (procalcitonin:4,lacticidven:4)  ) Recent Results (from the past 240 hour(s))  Resp Panel by RT-PCR (Flu A&B, Covid) Nasopharyngeal Swab     Status: None   Collection Time: 04/01/21  6:48 PM   Specimen: Nasopharyngeal Swab; Nasopharyngeal(NP) swabs in vial transport medium  Result Value Ref Range Status   SARS Coronavirus 2 by RT PCR NEGATIVE NEGATIVE Final    Comment: (NOTE) SARS-CoV-2 target nucleic acids are NOT  DETECTED.  The SARS-CoV-2 RNA is generally detectable in upper respiratory specimens during the acute phase of infection. The lowest concentration of SARS-CoV-2 viral copies this assay can detect is 138 copies/mL. A negative result does not preclude SARS-Cov-2 infection and should not be used as the sole basis for treatment or other patient management decisions. A negative result may occur with  improper specimen collection/handling, submission of specimen other than nasopharyngeal swab, presence of viral mutation(s) within the areas targeted by this assay, and inadequate number of viral copies(<138 copies/mL). A negative result must be combined with clinical observations, patient history, and epidemiological information. The expected result is Negative.  Fact Sheet for Patients:  EntrepreneurPulse.com.au  Fact Sheet for Healthcare Providers:  IncredibleEmployment.be  This test is no t yet approved or cleared by the [place] FDA and  has been authorized for detection and/or diagnosis of SARS-CoV-2 by FDA under an Emergency Use Authorization (EUA). This EUA will remain  in effect (meaning this test can be used) for the duration of the COVID-19 declaration under Section  564(b)(1) of the Act, 21 U.S.C.section 360bbb-3(b)(1), unless the authorization is terminated  or revoked sooner.       Influenza A by PCR NEGATIVE NEGATIVE Final   Influenza B by PCR NEGATIVE NEGATIVE Final    Comment: (NOTE) The Xpert Xpress SARS-CoV-2/FLU/RSV plus assay is intended as an aid in the diagnosis of influenza from Nasopharyngeal swab specimens and should not be used as a sole basis for treatment. Nasal washings and aspirates are unacceptable for Xpert Xpress SARS-CoV-2/FLU/RSV testing.  Fact Sheet for Patients: EntrepreneurPulse.com.au  Fact Sheet for Healthcare Providers: IncredibleEmployment.be  This test is not yet  approved or cleared by the [place] FDA and has been authorized for detection and/or diagnosis of SARS-CoV-2 by FDA under an Emergency Use Authorization (EUA). This EUA will remain in effect (meaning this test can be used) for the duration of the COVID-19 declaration under Section 564(b)(1) of the Act, 21 U.S.C. section 360bbb-3(b)(1), unless the authorization is terminated or revoked.  Performed at [facility], [place] [address], [place], [place] [address]   Respiratory (~20 pathogens) panel by PCR     Status: None   Collection Time: 04/01/21  9:49 PM   Specimen: Nasopharyngeal Swab; Respiratory  Result Value Ref Range Status   Adenovirus NOT DETECTED NOT DETECTED Final   Coronavirus 229E NOT DETECTED NOT DETECTED Final    Comment: (NOTE) The Coronavirus on the Respiratory Panel, DOES NOT test for the novel  Coronavirus (2019 nCoV)    Coronavirus HKU1 NOT DETECTED NOT DETECTED Final   Coronavirus NL63 NOT DETECTED NOT DETECTED Final   Coronavirus OC43 NOT DETECTED NOT DETECTED Final   Metapneumovirus NOT DETECTED NOT DETECTED Final   Rhinovirus / Enterovirus NOT DETECTED NOT DETECTED Final   Influenza A NOT DETECTED NOT DETECTED Final   Influenza B NOT DETECTED NOT DETECTED Final   Parainfluenza Virus 1 NOT DETECTED NOT DETECTED Final   Parainfluenza Virus 2 NOT DETECTED NOT DETECTED Final   Parainfluenza Virus 3 NOT DETECTED NOT DETECTED Final   Parainfluenza Virus 4 NOT DETECTED NOT DETECTED Final   Respiratory Syncytial Virus NOT DETECTED NOT DETECTED Final   Bordetella pertussis NOT DETECTED NOT DETECTED Final   Bordetella Parapertussis NOT DETECTED NOT DETECTED Final   Chlamydophila pneumoniae NOT DETECTED NOT DETECTED Final   Mycoplasma pneumoniae NOT DETECTED NOT DETECTED Final    Comment: Performed at [facility], [place]. [address], [place], [place] [address]  Culture, blood (Routine X 2) w Reflex to ID Panel     Status: None (Preliminary result)    Collection Time: 04/02/21  1:15 AM   Specimen: BLOOD  Result Value Ref Range Status   Specimen Description BLOOD SITE NOT SPECIFIED  Final   Special Requests AEROBIC BOTTLE ONLY Blood Culture adequate volume  Final   Culture   Final    NO GROWTH 2 DAYS Performed at [facility], [place] [address] [place],  [address]    Report Status PENDING  Incomplete  Culture, blood (Routine X 2) w Reflex to ID Panel     Status: None (Preliminary result)   Collection Time: 04/02/21  1:15 AM   Specimen: BLOOD  Result Value Ref Range Status   Specimen Description BLOOD SITE NOT SPECIFIED  Final   Special Requests AEROBIC BOTTLE ONLY Blood Culture adequate volume  Final   Culture   Final    NO GROWTH 2 DAYS Performed at [facility], [place] [address] [place], [place]  [address]    Report Status PENDING  Incomplete  MRSA Next Gen by PCR, Nasal     Status: Abnormal   Collection Time: 04/02/21  2:22 PM   Specimen: Nasal Mucosa; Nasal Swab  Result Value Ref Range Status   MRSA by PCR Next Gen DETECTED (A) NOT DETECTED Final    Comment: RESULT CALLED TO, READ BACK BY AND VERIFIED WITH: [facility] PUGH RN 04/02/21 @1734  BY JW (NOTE) The GeneXpert MRSA Assay (FDA approved for NASAL specimens only), is one component of a comprehensive MRSA colonization surveillance program. It is not intended to diagnose MRSA infection nor to guide or monitor treatment for MRSA infections. Test performance is not FDA approved in patients less than 71 years old. Performed at [facility], [place] [address] [place], [place] [address]   Expectorated Sputum Assessment w Gram Stain, Rflx to Resp Cult     Status: None   Collection Time: 04/02/21  2:28 PM   Specimen: Expectorated Sputum  Result Value Ref Range Status   Specimen Description EXPECTORATED SPUTUM  Final   Special Requests NONE  Final   Sputum evaluation   Final    THIS SPECIMEN IS ACCEPTABLE FOR SPUTUM CULTURE Performed at [place], [place] [address] [place], [place] [address]    Report Status 04/02/2021 FINAL  Final  Culture, Respiratory w Gram Stain     Status: None   Collection Time: 04/02/21  2:28 PM  Result Value Ref Range Status   Specimen Description EXPECTORATED SPUTUM  Final   Special Requests NONE Reflexed from [id number]  Final   Gram Stain   Final    ABUNDANT WBC PRESENT,BOTH PMN AND MONONUCLEAR ABUNDANT GRAM NEGATIVE COCCOBACILLI FEW GRAM POSITIVE COCCI    Culture   Final    ABUNDANT HAEMOPHILUS INFLUENZAE BETA LACTAMASE POSITIVE WITHIN MIXED FLORA Performed at [facility], [place] [address] [place], [place] [address]    Report Status 04/04/2021 FINAL  Final         Radiology Studies: No results found.  Scheduled Meds:  amLODipine  10 mg Oral Daily   Chlorhexidine Gluconate Cloth  6 each Topical Q0600   clonazepam  1 mg Oral TID   cloNIDine  0.1 mg Oral QID   Followed by   Derrill Memo ON 04/05/2021] cloNIDine  0.1 mg Oral BH-qamhs   Followed by   Derrill Memo ON 04/08/2021] cloNIDine  0.1 mg Oral QAC breakfast   hydrALAZINE  25 mg Oral TID   ipratropium-albuterol  3 mL Nebulization BID   lidocaine  1 patch Transdermal Q24H   lisinopril  10 mg Oral Daily   mupirocin ointment  1 application Nasal BID   nicotine  21 mg Transdermal Daily   pantoprazole  40 mg Oral BID   predniSONE  40 mg Oral Q breakfast   sucralfate  1 g Oral TID WC & HS   Continuous Infusions:   LOS: 2 days    Time spent: 17min    Domenic Polite, MD [facility]   04/04/2021, 11:20 AM

## 2021-04-05 MED ORDER — HYDRALAZINE HCL 50 MG PO TABS
50.0000 mg | ORAL_TABLET | Freq: Three times a day (TID) | ORAL | Status: DC
  Administered 2021-04-05 – 2021-04-06 (×4): 50 mg via ORAL
  Filled 2021-04-05 (×4): qty 1

## 2021-04-05 MED ORDER — PREDNISONE 20 MG PO TABS
30.0000 mg | ORAL_TABLET | Freq: Every day | ORAL | Status: DC
  Administered 2021-04-06: 30 mg via ORAL
  Filled 2021-04-05: qty 1

## 2021-04-05 NOTE — Progress Notes (Signed)
PROGRESS NOTE    Terri [name]  WJX:[id number] DOB: September 17, 1975 DOA: 04/01/2021 PCP: Pcp, No  Brief Narrative: 25/homeless female with history of asthma/COPD, heavy tobacco abuse, cocaine and heroin abuse presented to the ED with cough wheezing and shortness of breath, she had just smoked cocaine couple of hours prior to admission. -She was recently admitted for UTI, treated with IV ceftriaxone, she was discharged home on antihypertensives. -Does not have a PCP -In the ED she was noted to be severely hypokalemic, also noted to have microcytic anemia -Hospital course complicated by ongoing withdrawals with accelerated hypertension -11/11pm: staff witnessed patient calling friends to bring her some stuff, 11/12 am, 42min after friend visited, her blood pressure shot up to 200 was also noted to be more sleepy  Assessment & Plan:   COPD/asthma exacerbation -Improving, no wheezing anymore, taper off prednisone quickly -Continue duo nebs -COVID, influenza and respiratory virus panel negative  Severe iron deficiency anemia B12 deficiency -Known marginal ulcer at the site of gastric bypass -Last endoscopy in 8/22 noted 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  Cocaine, heroin withdrawal Polysubstance abuse Homelessness -Continue clonidine per protocol -Continued on Klonopin, reportedly takes this chronically -Added Ativan as needed acutely to help with restlessness and increased symptoms of withdrawal -Antihypertensives, Imodium etc. -Counseled, social worker consult  Severe hypokalemia -Replaced  Accelerated hypertension -Worsened by recent cocaine use and likely component of heroin withdrawals too -Scheduled clonidine -Continue amlodipine and  lisinopril -Increase hydralazine dose -PRN labetalol  Tobacco abuse -Counseled, nicotine patch  Chronic pain -Vicodin resumed, Robaxin  S/p gastric bypass surgery -Not taking supplemental multi vitamins  Severe protein calorie malnutrition -add supplements  DVT prophylaxis: SCDs Code Status: Full Code Family Communication: Friend at bedside yesterday Disposition Plan:  Status is: inpatient because: COPD exacerbation, accelerated hypertension, severe hypokalemia and anemia   Procedures:   Antimicrobials:    Subjective: -Asking for medications for withdrawal, visitors no longer allowed due to concern that she was abusing illicit substances in the hospital  Objective: Vitals:   04/04/21 1005 04/04/21 1532 04/04/21 1806 04/04/21 1851  BP: (!) 144/117 (!) 155/89 126/77   Pulse:   98   Resp:   (!) 34 20  Temp:      TempSrc:      SpO2:   100%   Weight:      Height:        Intake/Output Summary (Last 24 hours) at 04/05/2021 1026 Last data filed at 04/04/2021 1700 Gross per 24 hour  Intake 310.91 ml  Output --  Net 310.91 ml   Filed Weights   04/01/21 1834  Weight: 52.2 kg    Examination:  General exam: Chronically ill cachectic female laying in bed, awake alert oriented x3, no distress CVS: S1-S2, regular rhythm, nontachycardic today Lungs: Poor air movement, no wheezing Abdomen: Soft, nontender, bowel sounds present Extremities: No edema Skin: No rash on exposed skin Psychiatry: Mood & affect appropriate.     Data Reviewed:   CBC: Recent Labs  Lab 03/31/21 0525 04/01/21 1921 04/02/21 0115 04/03/21 0146  WBC 9.9 7.2 6.7 6.9  NEUTROABS  --  5.3 6.0  --   HGB 10.6* 9.2* 9.7* 9.6*  HCT 32.2* 28.2* 29.8* 28.8*  MCV 66.3* 66.4* 67.1* 66.4*  PLT 377 327 342 017   Basic Metabolic Panel: Recent Labs  Lab 03/31/21 0525 04/01/21 1921 04/02/21 0115 04/03/21 0146  NA 131* 135 135 135  K 2.6* 2.2* 2.8* 3.9  CL 95* 96* 98 102  CO2 27 27 25 26    GLUCOSE 127* 102* 111* 100*  BUN 7 13 13 12   CREATININE 0.52 0.97 0.95 0.75  CALCIUM 8.7* 8.5* 8.7* 9.0  MG  --  1.8 1.8  --   PHOS  --   --  3.0  --    GFR: Estimated Creatinine Clearance: 71.6 mL/min (by C-G formula based on SCr of 0.75 mg/dL). Liver Function Tests: Recent Labs  Lab 04/02/21 0115  AST 17  ALT 11  ALKPHOS 91  BILITOT 0.7  PROT 6.4*  ALBUMIN 3.0*   Recent Labs  Lab 04/02/21 0230  LIPASE 38   No results for input(s): AMMONIA in the last 168 hours. Coagulation Profile: No results for input(s): INR, PROTIME in the last 168 hours. Cardiac Enzymes: Recent Labs  Lab 04/02/21 0055  CKTOTAL 50   BNP (last 3 results) No results for input(s): PROBNP in the last 8760 hours. HbA1C: No results for input(s): HGBA1C in the last 72 hours. CBG: Recent Labs  Lab 04/01/21 1822  GLUCAP 96   Lipid Profile: No results for input(s): CHOL, HDL, LDLCALC, TRIG, CHOLHDL, LDLDIRECT in the last 72 hours. Thyroid Function Tests: No results for input(s): TSH, T4TOTAL, FREET4, T3FREE, THYROIDAB in the last 72 hours.  Anemia Panel: No results for input(s): VITAMINB12, FOLATE, FERRITIN, TIBC, IRON, RETICCTPCT in the last 72 hours.  Urine analysis:    Component Value Date/Time   COLORURINE YELLOW 04/01/2021 2150   APPEARANCEUR CLEAR 04/01/2021 2150   LABSPEC 1.006 04/01/2021 2150   PHURINE 6.0 04/01/2021 2150   GLUCOSEU NEGATIVE 04/01/2021 2150   HGBUR NEGATIVE 04/01/2021 2150   BILIRUBINUR NEGATIVE 04/01/2021 2150   KETONESUR NEGATIVE 04/01/2021 2150   PROTEINUR NEGATIVE 04/01/2021 2150   UROBILINOGEN 1.0 02/09/2015 1228   NITRITE NEGATIVE 04/01/2021 2150   LEUKOCYTESUR NEGATIVE 04/01/2021 2150   Sepsis Labs: @LABRCNTIP (procalcitonin:4,lacticidven:4)  ) Recent Results (from the past 240 hour(s))  Resp Panel by RT-PCR (Flu A&B, Covid) Nasopharyngeal Swab     Status: None   Collection Time: 04/01/21  6:48 PM   Specimen: Nasopharyngeal Swab; Nasopharyngeal(NP)  swabs in vial transport medium  Result Value Ref Range Status   SARS Coronavirus 2 by RT PCR NEGATIVE NEGATIVE Final    Comment: (NOTE) SARS-CoV-2 target nucleic acids are NOT DETECTED.  The SARS-CoV-2 RNA is generally detectable in upper respiratory specimens during the acute phase of infection. The lowest concentration of SARS-CoV-2 viral copies this assay can detect is 138 copies/mL. A negative result does not preclude SARS-Cov-2 infection and should not be used as the sole basis for treatment or other patient management decisions. A negative result may occur with  improper specimen collection/handling, submission of specimen other than nasopharyngeal swab, presence of viral mutation(s) within the areas targeted by this assay, and inadequate number of viral copies(<138 copies/mL). A negative result must be combined with clinical observations, patient history, and epidemiological information. The expected result is Negative.  Fact Sheet for Patients:  EntrepreneurPulse.com.au  Fact Sheet for Healthcare Providers:  IncredibleEmployment.be  This test is no t yet approved or cleared by the [place] FDA and  has been authorized for detection and/or diagnosis of SARS-CoV-2 by FDA under an Emergency Use Authorization (EUA). This EUA will remain  in effect (  meaning this test can be used) for the duration of the COVID-19 declaration under Section 564(b)(1) of the Act, 21 U.S.C.section 360bbb-3(b)(1), unless the authorization is terminated  or revoked sooner.       Influenza A by PCR NEGATIVE NEGATIVE Final   Influenza B by PCR NEGATIVE NEGATIVE Final    Comment: (NOTE) The Xpert Xpress SARS-CoV-2/FLU/RSV plus assay is intended as an aid in the diagnosis of influenza from Nasopharyngeal swab specimens and should not be used as a sole basis for treatment. Nasal washings and aspirates are unacceptable for Xpert Xpress  SARS-CoV-2/FLU/RSV testing.  Fact Sheet for Patients: EntrepreneurPulse.com.au  Fact Sheet for Healthcare Providers: IncredibleEmployment.be  This test is not yet approved or cleared by the [place] FDA and has been authorized for detection and/or diagnosis of SARS-CoV-2 by FDA under an Emergency Use Authorization (EUA). This EUA will remain in effect (meaning this test can be used) for the duration of the COVID-19 declaration under Section 564(b)(1) of the Act, 21 U.S.C. section 360bbb-3(b)(1), unless the authorization is terminated or revoked.  Performed at [facility], [place] [address], [place], [place] [address]   Respiratory (~20 pathogens) panel by PCR     Status: None   Collection Time: 04/01/21  9:49 PM   Specimen: Nasopharyngeal Swab; Respiratory  Result Value Ref Range Status   Adenovirus NOT DETECTED NOT DETECTED Final   Coronavirus 229E NOT DETECTED NOT DETECTED Final    Comment: (NOTE) The Coronavirus on the Respiratory Panel, DOES NOT test for the novel  Coronavirus (2019 nCoV)    Coronavirus HKU1 NOT DETECTED NOT DETECTED Final   Coronavirus NL63 NOT DETECTED NOT DETECTED Final   Coronavirus OC43 NOT DETECTED NOT DETECTED Final   Metapneumovirus NOT DETECTED NOT DETECTED Final   Rhinovirus / Enterovirus NOT DETECTED NOT DETECTED Final   Influenza A NOT DETECTED NOT DETECTED Final   Influenza B NOT DETECTED NOT DETECTED Final   Parainfluenza Virus 1 NOT DETECTED NOT DETECTED Final   Parainfluenza Virus 2 NOT DETECTED NOT DETECTED Final   Parainfluenza Virus 3 NOT DETECTED NOT DETECTED Final   Parainfluenza Virus 4 NOT DETECTED NOT DETECTED Final   Respiratory Syncytial Virus NOT DETECTED NOT DETECTED Final   Bordetella pertussis NOT DETECTED NOT DETECTED Final   Bordetella Parapertussis NOT DETECTED NOT DETECTED Final   Chlamydophila pneumoniae NOT DETECTED NOT DETECTED Final   Mycoplasma pneumoniae NOT DETECTED  NOT DETECTED Final    Comment: Performed at [facility], [place]. [address] [place], [place] [address]  Culture, blood (Routine X 2) w Reflex to ID Panel     Status: None (Preliminary result)   Collection Time: 04/02/21  1:15 AM   Specimen: BLOOD  Result Value Ref Range Status   Specimen Description BLOOD SITE NOT SPECIFIED  Final   Special Requests AEROBIC BOTTLE ONLY Blood Culture adequate volume  Final   Culture   Final    NO GROWTH 3 DAYS Performed at [facility], [place] [address] [place], [place] [address]    Report Status PENDING  Incomplete  Culture, blood (Routine X 2) w Reflex to ID Panel     Status: None (Preliminary result)   Collection Time: 04/02/21  1:15 AM   Specimen: BLOOD  Result Value Ref Range Status   Specimen Description BLOOD SITE NOT SPECIFIED  Final   Special Requests AEROBIC BOTTLE ONLY Blood Culture adequate volume  Final   Culture   Final    NO  GROWTH 3 DAYS Performed at [facility], [place] [address] [place], [place] [address]    Report Status PENDING  Incomplete  MRSA Next Gen by PCR, Nasal     Status: Abnormal   Collection Time: 04/02/21  2:22 PM   Specimen: Nasal Mucosa; Nasal Swab  Result Value Ref Range Status   MRSA by PCR Next Gen DETECTED (A) NOT DETECTED Final    Comment: RESULT CALLED TO, READ BACK BY AND VERIFIED WITH: [facility] RN 04/02/21 @1734  BY JW (NOTE) The GeneXpert MRSA Assay (FDA approved for NASAL specimens only), is one component of a comprehensive MRSA colonization surveillance program. It is not intended to diagnose MRSA infection nor to guide or monitor treatment for MRSA infections. Test performance is not FDA approved in patients less than 50 years old. Performed at [facility], [place] [address], [place], [place] [address]   Expectorated Sputum Assessment w Gram Stain, Rflx to Resp Cult     Status: None   Collection Time: 04/02/21  2:28 PM   Specimen: Expectorated Sputum  Result Value Ref Range  Status   Specimen Description EXPECTORATED SPUTUM  Final   Special Requests NONE  Final   Sputum evaluation   Final    THIS SPECIMEN IS ACCEPTABLE FOR SPUTUM CULTURE Performed at [facility], [place] [address] [place], [place] [address]    Report Status 04/02/2021 FINAL  Final  Culture, Respiratory w Gram Stain     Status: None   Collection Time: 04/02/21  2:28 PM  Result Value Ref Range Status   Specimen Description EXPECTORATED SPUTUM  Final   Special Requests NONE Reflexed from [id number]  Final   Gram Stain   Final    ABUNDANT WBC PRESENT,BOTH PMN AND MONONUCLEAR ABUNDANT GRAM NEGATIVE COCCOBACILLI FEW GRAM POSITIVE COCCI    Culture   Final    ABUNDANT HAEMOPHILUS INFLUENZAE BETA LACTAMASE POSITIVE WITHIN MIXED FLORA Performed at [facility], [place] [address], [place], [place] [address]    Report Status 04/04/2021 FINAL  Final    Radiology Studies: No results found.  Scheduled Meds:  amLODipine  10 mg Oral Daily   Chlorhexidine Gluconate Cloth  6 each Topical Q0600   clonazepam  1 mg Oral BID   cloNIDine  0.1 mg Oral BH-qamhs   Followed by   Derrill Memo ON 04/08/2021] cloNIDine  0.1 mg Oral QAC breakfast   hydrALAZINE  50 mg Oral TID   ipratropium-albuterol  3 mL Nebulization BID   lidocaine  1 patch Transdermal Q24H   lisinopril  10 mg Oral Daily   mupirocin ointment  1 application Nasal BID   nicotine  21 mg Transdermal Daily   pantoprazole  40 mg Oral BID   predniSONE  40 mg Oral Q breakfast   sucralfate  1 g Oral TID WC & HS   Continuous Infusions:  ferric gluconate (FERRLECIT) IVPB 250 mg (04/04/21 1618)     LOS: 3 days    Time spent: 79min  Domenic Polite, MD [facility]   04/05/2021, 10:26 AM

## 2021-04-05 NOTE — Progress Notes (Signed)
Pt is irate,verbally abusive and noncompliant. Pt continuously refusing progressive monitor, O2, and pulling leads off. Pt was educated on the importance of being compliant.   Pt continues to hit the call light , call the operator. Pt is upset bc she is not allowed to have visitors. Pt stated she would use the bathroom on herself in the bed over and over and not call out ;and we would have to clean her up. Pt is A&O x4 & ambulatory/independent

## 2021-04-05 NOTE — Progress Notes (Signed)
Pt continuing to refuse to be placed on progressive monitor . Pt educated. Will continue to monitor

## 2021-04-05 NOTE — Plan of Care (Signed)

## 2021-04-06 ENCOUNTER — Inpatient Hospital Stay: Payer: Medicaid Other | Admitting: Internal Medicine

## 2021-04-06 MED ORDER — HYDROXYZINE HCL 25 MG PO TABS
25.0000 mg | ORAL_TABLET | Freq: Three times a day (TID) | ORAL | 0 refills | Status: DC | PRN
Start: 2021-04-06 — End: 2021-10-12

## 2021-04-06 MED ORDER — VITAMIN B-12 1000 MCG PO TABS: 1000.0000 ug | ORAL_TABLET | Freq: Every day | ORAL | 0 refills | Status: DC

## 2021-04-06 MED ORDER — TRAMADOL HCL 50 MG PO TABS
50.0000 mg | ORAL_TABLET | Freq: Four times a day (QID) | ORAL | 0 refills | Status: DC | PRN
Start: 2021-04-06 — End: 2021-05-29

## 2021-04-06 MED ORDER — IRON 18 MG PO TBCR: 1.0000 | EXTENDED_RELEASE_TABLET | Freq: Two times a day (BID) | ORAL | 0 refills | Status: DC

## 2021-04-06 MED ORDER — LISINOPRIL 10 MG PO TABS: 10.0000 mg | ORAL_TABLET | Freq: Every day | ORAL | 0 refills | Status: DC

## 2021-04-06 MED ORDER — PREDNISONE 10 MG PO TABS: 20.0000 mg | ORAL_TABLET | Freq: Every day | ORAL | 0 refills | Status: AC

## 2021-04-06 MED ORDER — METHOCARBAMOL 750 MG PO TABS: 750.0000 mg | ORAL_TABLET | Freq: Three times a day (TID) | ORAL | 0 refills | Status: DC | PRN

## 2021-04-06 MED ORDER — NICOTINE 21 MG/24HR TD PT24: 21.0000 mg | MEDICATED_PATCH | Freq: Every day | TRANSDERMAL | 0 refills | Status: DC

## 2021-04-06 MED ORDER — HYDRALAZINE HCL 50 MG PO TABS: 50.0000 mg | ORAL_TABLET | Freq: Two times a day (BID) | ORAL | 0 refills | Status: DC

## 2021-04-06 MED ORDER — AMLODIPINE BESYLATE 10 MG PO TABS: 10.0000 mg | ORAL_TABLET | Freq: Every day | ORAL | 0 refills | Status: DC

## 2021-04-06 NOTE — Discharge Summary (Signed)
Physician Discharge Summary  Terri [name] DPO:[id number] DOB: [birth year] DOA: 04/01/2021  PCP: Merryl Hacker, No  Admit date: 04/01/2021 Discharge date: 04/06/2021  Time spent: 35 minutes  Recommendations for Outpatient Follow-up:  PCP at [facility] in 1 to 2 weeks Homeless shelter information given Needs gastroenterology follow-up for iron deficiency anemia   Discharge Diagnoses:  Cocaine abuse Heroin abuse COPD exacerbation Tobacco abuse Homelessness Hypertensive urgency   Essential hypertension   Hypokalemia   Hypomagnesemia   Polysubstance abuse ([place])   Asthma exacerbation   Anemia   Discharge Condition: Stable  Diet recommendation: Low-sodium  Filed Weights   04/01/21 1834  Weight: 52.2 kg    History of present illness:  21/homeless female with history of asthma/COPD, heavy tobacco abuse, cocaine and heroin abuse presented to the ED with cough wheezing and shortness of breath, she had just smoked cocaine couple of hours prior to admission. -She was recently admitted for UTI, treated with IV ceftriaxone, she was discharged home on antihypertensives. -Does not have a PCP -In the ED she was noted to be severely hypokalemic, also noted to have microcytic anemia -Hospital course complicated by ongoing withdrawals with accelerated hypertension  Hospital Course:  COPD/asthma exacerbation -Improving, no wheezing anymore, taper off prednisone quickly -Continue duo nebs -COVID, influenza and respiratory virus panel negative   Severe iron deficiency anemia B12 deficiency -Known marginal ulcer at the site of gastric bypass -Last endoscopy in 8/22 noted giant ulcer of the gastrojejunal anastomotic site, not actively bleeding then -Hemoglobin ranges from 8-10, will give IV iron -Continue PPI -Patient reports having a colonoscopy in the last 5 years, no records in our system -Given IV iron and vitamin B12 IM yesterday -Discharged home on oral iron and B12,  advised gastroenterology follow-up   Cocaine, heroin withdrawal Polysubstance abuse Homelessness -Continue clonidine per protocol -Continued on Klonopin, reportedly takes this chronically -Added Ativan as needed acutely to help with restlessness and increased symptoms of withdrawal -Antihypertensives, Imodium etc. -Counseled, social worker consult   Severe hypokalemia -Replaced   Accelerated hypertension -Worsened by recent cocaine use and likely component of heroin withdrawals too -Treated with scheduled clonidine and hydralazine, improving -Discharged home on oral lisinopril, amlodipine and hydralazine   Tobacco abuse -Counseled, nicotine patch   Chronic pain -Prescribed tramadol, Robaxin   S/p gastric bypass surgery -Not taking supplemental multi vitamins -B12 and iron added at discharge   Severe protein calorie malnutrition -Supplements added    Discharge Exam: Vitals:   04/06/21 0800 04/06/21 0953  BP: (!) 176/115   Pulse: 93   Resp: 16   Temp: 98.3 F (36.8 C)   SpO2: 99% 98%   General exam: Chronically ill cachectic female laying in bed, awake alert oriented x3, no distress CVS: S1-S2, regular rhythm, nontachycardic today Lungs: Poor air movement, no wheezing Abdomen: Soft, nontender, bowel sounds present Extremities: No edema Skin: No rash on exposed skin Psychiatry: Mood & affect appropriate.   Discharge Instructions   Discharge Instructions     Diet - low sodium heart healthy   Complete by: As directed    Increase activity slowly   Complete by: As directed       Allergies as of 04/06/2021       Reactions   Nsaids Other (See Comments)   Stomach ulcer   Ketorolac Tromethamine Itching, Nausea And Vomiting   Methocarbamol Diarrhea        Medication List     STOP taking these medications    hydrochlorothiazide  25 MG tablet Commonly known as: HYDRODIURIL   sucralfate 1 GM/10ML suspension Commonly known as: Carafate       TAKE  these medications    amLODipine 10 MG tablet Commonly known as: NORVASC Take 1 tablet (10 mg total) by mouth daily.   hydrALAZINE 50 MG tablet Commonly known as: APRESOLINE Take 1 tablet (50 mg total) by mouth 2 (two) times daily.   hydrOXYzine 25 MG tablet Commonly known as: ATARAX/VISTARIL Take 1 tablet (25 mg total) by mouth every 8 (eight) hours as needed for anxiety.   lisinopril 10 MG tablet Commonly known as: ZESTRIL Take 1 tablet (10 mg total) by mouth daily.   methocarbamol 750 MG tablet Commonly known as: ROBAXIN Take 1 tablet (750 mg total) by mouth every 8 (eight) hours as needed for muscle spasms.   nicotine 21 mg/24hr patch Commonly known as: NICODERM CQ - dosed in mg/24 hours Place 1 patch (21 mg total) onto the skin daily. Start taking on: April 07, 2021   pantoprazole 40 MG tablet Commonly known as: PROTONIX Take 1 tablet (40 mg total) by mouth daily.   predniSONE 10 MG tablet Commonly known as: DELTASONE Take 2 tablets (20 mg total) by mouth daily with breakfast for 2 days. Start taking on: April 07, 2021   traMADol 50 MG tablet Commonly known as: ULTRAM Take 1 tablet (50 mg total) by mouth every 6 (six) hours as needed for moderate pain.       Allergies  Allergen Reactions   Nsaids Other (See Comments)    Stomach ulcer    Ketorolac Tromethamine Itching and Nausea And Vomiting   Methocarbamol Diarrhea    Follow-up Information     [facility] Follow up on 04/06/2021.   Why: hospital follow up schedulued for 04/06/2021 at 14:30 pm with  Dr. Karle Plumber Contact information: [place] [id number] [phone]                 The results of significant diagnostics from this hospitalization (including imaging, microbiology, ancillary and laboratory) are listed below for reference.    Significant Diagnostic Studies: DG Chest 2 View  Result Date:  03/30/2021 CLINICAL DATA:  Shortness of breath. EXAM: CHEST - 2 VIEW COMPARISON:  03/12/2021. FINDINGS: The heart size and mediastinal contours are within normal limits. Hyperinflation of the lungs is noted. No consolidation, effusion, or pneumothorax. Old rib fractures are noted on the left with bone callus formation. IMPRESSION: Hyperinflation of the lungs with no acute process. Electronically Signed   By: Brett Fairy M.D.   On: 03/30/2021 20:05   CT Angio Chest Pulmonary Embolism (PE) W or WO Contrast  Result Date: 04/02/2021 CLINICAL DATA:  Wheezing and shortness of breath. Recent cocaine use. EXAM: CT ANGIOGRAPHY CHEST WITH CONTRAST TECHNIQUE: Multidetector CT imaging of the chest was performed using the standard protocol during bolus administration of intravenous contrast. Multiplanar CT image reconstructions and MIPs were obtained to evaluate the vascular anatomy. CONTRAST:  61mL OMNIPAQUE IOHEXOL 350 MG/ML SOLN COMPARISON:  02/26/2021 FINDINGS: Cardiovascular: Satisfactory opacification of the pulmonary arteries to the segmental level. No evidence of pulmonary embolism. Normal heart size. No pericardial effusion. Age advanced atherosclerosis of the aorta and coronaries. Mediastinum/Nodes: Negative for adenopathy or mass Lungs/Pleura: Generalized airway thickening patchy reticulation and micro nodularity. The micro nodularity is greatest in the apical lungs, question smoking-related respiratory bronchiolitis. Upper Abdomen: Gastric bypass with persisting gas collection near the gastroenteric anastomosis,  most consistent with marginal ulcer. This gaseous collection measures up to 2.7 cm in diameter. No pneumoperitoneum. Musculoskeletal: No acute finding Review of the MIP images confirms the above findings. IMPRESSION: 1. Negative for pulmonary embolism. 2. Bronchitis and bronchiolitis. 3. Gastric bypass with marginal ulcer that is known from prior CTs. Electronically Signed   By: Jorje Guild M.D.    On: 04/02/2021 05:21   CT ABDOMEN PELVIS W CONTRAST  Result Date: 03/15/2021 CLINICAL DATA:  Nonlocalized acute abdominal pain. Abdomen pain lower cramping Pelvic inflammatory disease and STD per patient. EXAM: CT ABDOMEN AND PELVIS WITH CONTRAST TECHNIQUE: Multidetector CT imaging of the abdomen and pelvis was performed using the standard protocol following bolus administration of intravenous contrast. CONTRAST:  77mL OMNIPAQUE IOHEXOL 350 MG/ML SOLN COMPARISON:  CT abdomen pelvis 02/26/2021 FINDINGS: Lower chest: Bibasilar linear atelectasis versus scarring. Hepatobiliary: Indeterminate oval 1.1 cm right hepatic lobe hypodensity (2:22). Status post cholecystectomy. No biliary dilatation. Pancreas: No focal lesion. Normal pancreatic contour. No surrounding inflammatory changes. No main pancreatic ductal dilatation. Spleen: Normal in size without focal abnormality. Adrenals/Urinary Tract: No adrenal nodule bilaterally. Bilateral kidneys enhance symmetrically. No hydronephrosis. No hydroureter. The urinary bladder is unremarkable. On delayed imaging, there is no urothelial wall thickening and there are no filling defects in the opacified portions of the bilateral collecting systems or ureters. Stomach/Bowel: Gastric Roux-en-Y surgical bypass surgical changes. Stomach is within normal limits. No evidence of bowel wall thickening or dilatation. Stool throughout the colon. The appendix not definitely identified. Vascular/Lymphatic: No abdominal aorta or iliac aneurysm. Severe atherosclerotic plaque of the aorta and its branches. No abdominal, pelvic, or inguinal lymphadenopathy. Reproductive: Markedly limited evaluation of the pelvis due to lack of intraperitoneal fat. Uterus and bilateral adnexa are unremarkable. Other: No intraperitoneal free fluid. No intraperitoneal free gas. No organized fluid collection. Musculoskeletal: Several tiny foci of gas along the subcutaneus soft tissues of the bilateral labia  majora. No suspicious lytic or blastic osseous lesions. No acute displaced fracture. Multilevel degenerative changes of the spine. IMPRESSION: 1. Several tiny foci of gas along the subcutaneus soft tissues of the bilateral labia majora. Gas could be within folds external to the soft tissues but this is unlikely given the distribution on coronal imaging. No organized fluid collection. Necrotizing fasciitis cannot be excluded as this is a clinical diagnosis. Recommend correlation with physical exam. 2. No acute intra-abdominal or intrapelvic abnormality with slightly limited evaluation due to lack of intraperitoneal fat. Given history, please consider pelvic ultrasound if clinically indicated. 3. Stool throughout the colon. Electronically Signed   By: Iven Finn M.D.   On: 03/15/2021 18:00   DG Chest Port 1 View  Result Date: 04/01/2021 CLINICAL DATA:  Shortness of breath, cough x3 days EXAM: PORTABLE CHEST 1 VIEW COMPARISON:  03/30/2021 FINDINGS: Lungs are clear.  No pleural effusion or pneumothorax. The heart is normal in size. IMPRESSION: No evidence of acute cardiopulmonary disease. Electronically Signed   By: Julian Hy M.D.   On: 04/01/2021 19:07   DG Chest Port 1 View  Result Date: 03/12/2021 CLINICAL DATA:  Chest pain EXAM: PORTABLE CHEST 1 VIEW COMPARISON:  02/26/2021 FINDINGS: The heart size and mediastinal contours are within normal limits. Both lungs are clear. The visualized skeletal structures are unremarkable. IMPRESSION: Negative. Electronically Signed   By: Rolm Baptise M.D.   On: 03/12/2021 22:09   CT Renal Stone Study  Result Date: 03/12/2021 CLINICAL DATA:  Flank pain, kidney stone suspected left flank pain. EXAM: CT ABDOMEN AND  PELVIS WITHOUT CONTRAST TECHNIQUE: Multidetector CT imaging of the abdomen and pelvis was performed following the standard protocol without IV contrast. COMPARISON:  02/26/2021 FINDINGS: Lower chest: No acute abnormality. Hepatobiliary: Prior  cholecystectomy.  No focal hepatic abnormality. Pancreas: No focal abnormality or ductal dilatation. Spleen: No focal abnormality.  Normal size. Adrenals/Urinary Tract: No adrenal abnormality. No focal renal abnormality. No stones or hydronephrosis. Urinary bladder is unremarkable. 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 Moderate stool throughout the colon. No evidence of bowel obstruction. Vascular/Lymphatic: Heavily calcified aorta and iliac vessels. No evidence of aneurysm or adenopathy. Reproductive: Uterus and adnexa unremarkable.  No mass. Other: No free fluid or free air. Musculoskeletal: No acute bony abnormality. Degenerative changes in the lumbar spine. IMPRESSION: No renal or ureteral stones.  No hydronephrosis. Heavily calcified aorta and iliac vessels. Moderate stool burden in the colon No acute findings in the abdomen or pelvis. Electronically Signed   By: Rolm Baptise M.D.   On: 03/12/2021 22:43    Microbiology: Recent Results (from the past 240 hour(s))  Resp Panel by RT-PCR (Flu A&B, Covid) Nasopharyngeal Swab     Status: None   Collection Time: 04/01/21  6:48 PM   Specimen: Nasopharyngeal Swab; Nasopharyngeal(NP) swabs in vial transport medium  Result Value Ref Range Status   SARS Coronavirus 2 by RT PCR NEGATIVE NEGATIVE Final    Comment: (NOTE) SARS-CoV-2 target nucleic acids are NOT DETECTED.  The SARS-CoV-2 RNA is generally detectable in upper respiratory specimens during the acute phase of infection. The lowest concentration of SARS-CoV-2 viral copies this assay can detect is 138 copies/mL. A negative result does not preclude SARS-Cov-2 infection and should not be used as the sole basis for treatment or other patient management decisions. A negative result may occur with  improper specimen collection/handling, submission of specimen other than nasopharyngeal swab, presence of viral mutation(s) within the areas targeted by this assay, and inadequate  number of viral copies(<138 copies/mL). A negative result must be combined with clinical observations, patient history, and epidemiological information. The expected result is Negative.  Fact Sheet for Patients:  EntrepreneurPulse.com.au  Fact Sheet for Healthcare Providers:  IncredibleEmployment.be  This test is no t yet approved or cleared by the [place] FDA and  has been authorized for detection and/or diagnosis of SARS-CoV-2 by FDA under an Emergency Use Authorization (EUA). This EUA will remain  in effect (meaning this test can be used) for the duration of the COVID-19 declaration under Section 564(b)(1) of the Act, 21 U.S.C.section 360bbb-3(b)(1), unless the authorization is terminated  or revoked sooner.       Influenza A by PCR NEGATIVE NEGATIVE Final   Influenza B by PCR NEGATIVE NEGATIVE Final    Comment: (NOTE) The Xpert Xpress SARS-CoV-2/FLU/RSV plus assay is intended as an aid in the diagnosis of influenza from Nasopharyngeal swab specimens and should not be used as a sole basis for treatment. Nasal washings and aspirates are unacceptable for Xpert Xpress SARS-CoV-2/FLU/RSV testing.  Fact Sheet for Patients: EntrepreneurPulse.com.au  Fact Sheet for Healthcare Providers: IncredibleEmployment.be  This test is not yet approved or cleared by the [place] FDA and has been authorized for detection and/or diagnosis of SARS-CoV-2 by FDA under an Emergency Use Authorization (EUA). This EUA will remain in effect (meaning this test can be used) for the duration of the COVID-19 declaration under Section 564(b)(1) of the Act, 21 U.S.C. section 360bbb-3(b)(1), unless the authorization is terminated or revoked.  Performed at [facility], [place]  [address] [place], [place] [address]   Respiratory (~20 pathogens) panel by PCR     Status: None   Collection Time: 04/01/21  9:49 PM    Specimen: Nasopharyngeal Swab; Respiratory  Result Value Ref Range Status   Adenovirus NOT DETECTED NOT DETECTED Final   Coronavirus 229E NOT DETECTED NOT DETECTED Final    Comment: (NOTE) The Coronavirus on the Respiratory Panel, DOES NOT test for the novel  Coronavirus (2019 nCoV)    Coronavirus HKU1 NOT DETECTED NOT DETECTED Final   Coronavirus NL63 NOT DETECTED NOT DETECTED Final   Coronavirus OC43 NOT DETECTED NOT DETECTED Final   Metapneumovirus NOT DETECTED NOT DETECTED Final   Rhinovirus / Enterovirus NOT DETECTED NOT DETECTED Final   Influenza A NOT DETECTED NOT DETECTED Final   Influenza B NOT DETECTED NOT DETECTED Final   Parainfluenza Virus 1 NOT DETECTED NOT DETECTED Final   Parainfluenza Virus 2 NOT DETECTED NOT DETECTED Final   Parainfluenza Virus 3 NOT DETECTED NOT DETECTED Final   Parainfluenza Virus 4 NOT DETECTED NOT DETECTED Final   Respiratory Syncytial Virus NOT DETECTED NOT DETECTED Final   Bordetella pertussis NOT DETECTED NOT DETECTED Final   Bordetella Parapertussis NOT DETECTED NOT DETECTED Final   Chlamydophila pneumoniae NOT DETECTED NOT DETECTED Final   Mycoplasma pneumoniae NOT DETECTED NOT DETECTED Final    Comment: Performed at [facility], [place]. [address], [place], [place] [address]  Culture, blood (Routine X 2) w Reflex to ID Panel     Status: None (Preliminary result)   Collection Time: 04/02/21  1:15 AM   Specimen: BLOOD  Result Value Ref Range Status   Specimen Description BLOOD SITE NOT SPECIFIED  Final   Special Requests AEROBIC BOTTLE ONLY Blood Culture adequate volume  Final   Culture   Final    NO GROWTH 4 DAYS Performed at [facility], [place] [address], [place], [place] [address]    Report Status PENDING  Incomplete  Culture, blood (Routine X 2) w Reflex to ID Panel     Status: None (Preliminary result)   Collection Time: 04/02/21  1:15 AM   Specimen: BLOOD  Result Value Ref Range Status   Specimen Description BLOOD SITE  NOT SPECIFIED  Final   Special Requests AEROBIC BOTTLE ONLY Blood Culture adequate volume  Final   Culture   Final    NO GROWTH 4 DAYS Performed at [facility], [place] [address] [place], [place] [address]    Report Status PENDING  Incomplete  MRSA Next Gen by PCR, Nasal     Status: Abnormal   Collection Time: 04/02/21  2:22 PM   Specimen: Nasal Mucosa; Nasal Swab  Result Value Ref Range Status   MRSA by PCR Next Gen DETECTED (A) NOT DETECTED Final    Comment: RESULT CALLED TO, READ BACK BY AND VERIFIED WITH: [facility] RN 04/02/21 @1734  BY JW (NOTE) The GeneXpert MRSA Assay (FDA approved for NASAL specimens only), is one component of a comprehensive MRSA colonization surveillance program. It is not intended to diagnose MRSA infection nor to guide or monitor treatment for MRSA infections. Test performance is not FDA approved in patients less than 84 years old. Performed at [facility], [place] [address], [place],  [address]   Expectorated Sputum Assessment w Gram Stain, Rflx to Resp Cult     Status: None   Collection Time: 04/02/21  2:28 PM   Specimen: Expectorated Sputum  Result Value Ref Range  Status   Specimen Description EXPECTORATED SPUTUM  Final   Special Requests NONE  Final   Sputum evaluation   Final    THIS SPECIMEN IS ACCEPTABLE FOR SPUTUM CULTURE Performed at [facility], [address], [place], [place] [address]    Report Status 04/02/2021 FINAL  Final  Culture, Respiratory w Gram Stain     Status: None   Collection Time: 04/02/21  2:28 PM  Result Value Ref Range Status   Specimen Description EXPECTORATED SPUTUM  Final   Special Requests NONE Reflexed from [id number]  Final   Gram Stain   Final    ABUNDANT WBC PRESENT,BOTH PMN AND MONONUCLEAR ABUNDANT GRAM NEGATIVE COCCOBACILLI FEW GRAM POSITIVE COCCI    Culture   Final    ABUNDANT HAEMOPHILUS INFLUENZAE BETA LACTAMASE POSITIVE WITHIN MIXED FLORA Performed at [facility],  [place] [address], [place], [place] [address]    Report Status 04/04/2021 FINAL  Final     Labs: Basic Metabolic Panel: Recent Labs  Lab 03/31/21 0525 04/01/21 1921 04/02/21 0115 04/03/21 0146  NA 131* 135 135 135  K 2.6* 2.2* 2.8* 3.9  CL 95* 96* 98 102  CO2 27 27 25 26   GLUCOSE 127* 102* 111* 100*  BUN 7 13 13 12   CREATININE 0.52 0.97 0.95 0.75  CALCIUM 8.7* 8.5* 8.7* 9.0  MG  --  1.8 1.8  --   PHOS  --   --  3.0  --    Liver Function Tests: Recent Labs  Lab 04/02/21 0115  AST 17  ALT 11  ALKPHOS 91  BILITOT 0.7  PROT 6.4*  ALBUMIN 3.0*   Recent Labs  Lab 04/02/21 0230  LIPASE 38   No results for input(s): AMMONIA in the last 168 hours. CBC: Recent Labs  Lab 03/31/21 0525 04/01/21 1921 04/02/21 0115 04/03/21 0146  WBC 9.9 7.2 6.7 6.9  NEUTROABS  --  5.3 6.0  --   HGB 10.6* 9.2* 9.7* 9.6*  HCT 32.2* 28.2* 29.8* 28.8*  MCV 66.3* 66.4* 67.1* 66.4*  PLT 377 327 342 306   Cardiac Enzymes: Recent Labs  Lab 04/02/21 0055  CKTOTAL 50   BNP: BNP (last 3 results) No results for input(s): BNP in the last 8760 hours.  ProBNP (last 3 results) No results for input(s): PROBNP in the last 8760 hours.  CBG: Recent Labs  Lab 04/01/21 1822  GLUCAP 96       Signed:  Domenic Polite MD.  [facility] 04/06/2021, 11:29 AM

## 2021-04-06 NOTE — TOC Progression Note (Signed)
Transition of Care [facility]) - Initial/Assessment Note    Patient Details  Name: Terri [name] MRN: [id number] Date of Birth: 1976-03-29  [facility] CM/SW Contact:    Milinda Antis, LCSWA Phone Number: 04/06/2021, 11:16 AM  Clinical Narrative:                  CSW contacted Glenard Haring with [employer] to inquire about possible housing for the patient.  CSW was informed that the shelter is willing to provide a week stay at a hotel for the patient.  CSW was sent a form for the patient to sign.   CSW assisted the patient with completing the form and sent the information back to [facility].  CSW is awaiting the hotel confirmation, but was assured that everything would be set up today.         Patient Goals and CMS Choice        Expected Discharge Plan and Services                                                Prior Living Arrangements/Services                       Activities of Daily Living      Permission Sought/Granted                  Emotional Assessment              Admission diagnosis:  Asthma exacerbation [J45.901] Patient Active Problem List   Diagnosis Date Noted   Asthma exacerbation 04/01/2021   Anemia 04/01/2021   Iron deficiency anemia due to chronic blood loss 03/18/2021   Hyponatremia 03/18/2021   Marginal ulcer 03/15/2021   Microcytic anemia 02/26/2021   Mild protein malnutrition ([place]) 02/26/2021   Polysubstance abuse ([place]) 01/16/2021   Abdominal pain 01/14/2021   Ulcer at site of surgical anastomosis following bypass of stomach 01/13/2021   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 coli) ([place]) 02/11/2019   AKI (acute kidney injury) ([place])    Gastric ulcer  02/09/2019   S/P gastric bypass 02/09/2019   Persistent fever 02/09/2019   COVID-19 virus not detected    Chest pain    Bacteremia due to Escherichia coli    Esophagitis    Hypokalemia    Hypomagnesemia    Sepsis ([place]) 02/02/2019   Leucocytosis 02/02/2019   Thrombocytosis 02/02/2019   UTI (urinary tract infection) 02/02/2019   Apnea 09/13/2017   Essential hypertension    Cellulitis and abscess of neck 08/12/2017   Cellulitis of submandibular region 08/10/2017   Odontogenic infection of jaw 08/10/2017   Cholecystitis 02/14/2017   Opioid abuse with opioid-induced mood disorder ([place]) 01/25/2017   Anxiety 02/11/2014   Depression 07/12/2012   PCP:  Pcp, No Pharmacy:   [id number] - , [place] [address] Phone: [phone] Fax: [place] [facility] [place] [address] [place] [address] Phone: [phone] Fax: [phone]  Social Determinants of Health (SDOH) Interventions    Readmission Risk Interventions No flowsheet data found.   

## 2021-04-06 NOTE — Progress Notes (Signed)
   04/06/21 1135  Clinical Encounter Type  Visited With Patient  Visit Type Psychological support;Spiritual support;Initial  Referral From Nurse  Consult/Referral To [facility] rounding on unit. The patient was sitting up in her bed and appeared to be emotional. Chaplain encouraged sharing of feelings. The patient said she would be discharged but lives in her car. The patient said she would need food and financial assistance because she does not have a support system. Chaplain advised her to speak with the unit's CSW about housing. Chaplain asked the patient if she was familiar with [facility] in [facility], and she was. The patient's anxiety seems to lessen by the end of the visit. Chaplain offered prayer. This note was prepared by Jeanine Luz, M.Div..  For questions please contact by phone [phone].

## 2021-04-07 LAB — CULTURE, BLOOD (ROUTINE X 2)
Culture: NO GROWTH
Culture: NO GROWTH
Special Requests: ADEQUATE
Special Requests: ADEQUATE

## 2021-04-13 ENCOUNTER — Emergency Department (HOSPITAL_COMMUNITY): Payer: Medicaid Other

## 2021-04-13 ENCOUNTER — Other Ambulatory Visit: Payer: Self-pay

## 2021-04-13 ENCOUNTER — Encounter (HOSPITAL_COMMUNITY): Payer: Self-pay | Admitting: Emergency Medicine

## 2021-04-13 ENCOUNTER — Emergency Department (HOSPITAL_COMMUNITY)
Admission: EM | Admit: 2021-04-13 | Discharge: 2021-04-13 | DRG: 918 | Discharge status: Against Medical Advice | Payer: Medicaid Other | Attending: Internal Medicine | Admitting: Internal Medicine

## 2021-04-13 DIAGNOSIS — F319 Bipolar disorder, unspecified: Secondary | ICD-10-CM | POA: Diagnosis present

## 2021-04-13 DIAGNOSIS — I10 Essential (primary) hypertension: Secondary | ICD-10-CM | POA: Diagnosis present

## 2021-04-13 DIAGNOSIS — Z886 Allergy status to analgesic agent status: Secondary | ICD-10-CM | POA: Diagnosis not present

## 2021-04-13 DIAGNOSIS — R451 Restlessness and agitation: Secondary | ICD-10-CM | POA: Diagnosis present

## 2021-04-13 DIAGNOSIS — Z5901 Sheltered homelessness: Secondary | ICD-10-CM | POA: Diagnosis not present

## 2021-04-13 DIAGNOSIS — F17210 Nicotine dependence, cigarettes, uncomplicated: Secondary | ICD-10-CM | POA: Diagnosis not present

## 2021-04-13 DIAGNOSIS — N179 Acute kidney failure, unspecified: Secondary | ICD-10-CM | POA: Diagnosis not present

## 2021-04-13 DIAGNOSIS — R400 Somnolence: Secondary | ICD-10-CM

## 2021-04-13 DIAGNOSIS — R0902 Hypoxemia: Secondary | ICD-10-CM | POA: Diagnosis not present

## 2021-04-13 DIAGNOSIS — Z888 Allergy status to other drugs, medicaments and biological substances status: Secondary | ICD-10-CM

## 2021-04-13 DIAGNOSIS — Z86718 Personal history of other venous thrombosis and embolism: Secondary | ICD-10-CM

## 2021-04-13 DIAGNOSIS — D75839 Thrombocytosis, unspecified: Secondary | ICD-10-CM | POA: Diagnosis present

## 2021-04-13 DIAGNOSIS — G629 Polyneuropathy, unspecified: Secondary | ICD-10-CM | POA: Diagnosis not present

## 2021-04-13 DIAGNOSIS — T50901A Poisoning by unspecified drugs, medicaments and biological substances, accidental (unintentional), initial encounter: Secondary | ICD-10-CM | POA: Diagnosis not present

## 2021-04-13 DIAGNOSIS — F1510 Other stimulant abuse, uncomplicated: Secondary | ICD-10-CM | POA: Diagnosis present

## 2021-04-13 DIAGNOSIS — Z981 Arthrodesis status: Secondary | ICD-10-CM

## 2021-04-13 DIAGNOSIS — G8929 Other chronic pain: Secondary | ICD-10-CM | POA: Diagnosis present

## 2021-04-13 DIAGNOSIS — Z79899 Other long term (current) drug therapy: Secondary | ICD-10-CM | POA: Diagnosis not present

## 2021-04-13 DIAGNOSIS — Z9884 Bariatric surgery status: Secondary | ICD-10-CM | POA: Diagnosis not present

## 2021-04-13 DIAGNOSIS — F1210 Cannabis abuse, uncomplicated: Secondary | ICD-10-CM | POA: Diagnosis not present

## 2021-04-13 DIAGNOSIS — E162 Hypoglycemia, unspecified: Secondary | ICD-10-CM

## 2021-04-13 DIAGNOSIS — Z8249 Family history of ischemic heart disease and other diseases of the circulatory system: Secondary | ICD-10-CM | POA: Diagnosis not present

## 2021-04-13 DIAGNOSIS — D72829 Elevated white blood cell count, unspecified: Secondary | ICD-10-CM | POA: Diagnosis present

## 2021-04-13 DIAGNOSIS — T50904A Poisoning by unspecified drugs, medicaments and biological substances, undetermined, initial encounter: Secondary | ICD-10-CM | POA: Diagnosis not present

## 2021-04-13 DIAGNOSIS — F1910 Other psychoactive substance abuse, uncomplicated: Secondary | ICD-10-CM | POA: Diagnosis present

## 2021-04-13 DIAGNOSIS — Z20822 Contact with and (suspected) exposure to covid-19: Secondary | ICD-10-CM | POA: Diagnosis present

## 2021-04-13 DIAGNOSIS — F1410 Cocaine abuse, uncomplicated: Secondary | ICD-10-CM | POA: Diagnosis not present

## 2021-04-13 DIAGNOSIS — T401X1A Poisoning by heroin, accidental (unintentional), initial encounter: Secondary | ICD-10-CM | POA: Diagnosis present

## 2021-04-13 LAB — URINALYSIS, ROUTINE W REFLEX MICROSCOPIC
Bilirubin Urine: NEGATIVE
Glucose, UA: NEGATIVE mg/dL
Ketones, ur: NEGATIVE mg/dL
Leukocytes,Ua: NEGATIVE
Nitrite: NEGATIVE
Protein, ur: NEGATIVE mg/dL
Specific Gravity, Urine: 1.01 (ref 1.005–1.030)
pH: 5 (ref 5.0–8.0)

## 2021-04-13 LAB — CBC WITH DIFFERENTIAL/PLATELET
Abs Immature Granulocytes: 0.12 10*3/uL — ABNORMAL HIGH (ref 0.00–0.07)
Basophils Absolute: 0.1 10*3/uL (ref 0.0–0.1)
Basophils Relative: 0 %
Eosinophils Absolute: 0 10*3/uL (ref 0.0–0.5)
Eosinophils Relative: 0 %
HCT: 30.3 % — ABNORMAL LOW (ref 36.0–46.0)
Hemoglobin: 9.7 g/dL — ABNORMAL LOW (ref 12.0–15.0)
Immature Granulocytes: 1 %
Lymphocytes Relative: 12 %
Lymphs Abs: 2.8 10*3/uL (ref 0.7–4.0)
MCH: 22.5 pg — ABNORMAL LOW (ref 26.0–34.0)
MCHC: 32 g/dL (ref 30.0–36.0)
MCV: 70.3 fL — ABNORMAL LOW (ref 80.0–100.0)
Monocytes Absolute: 1.6 10*3/uL — ABNORMAL HIGH (ref 0.1–1.0)
Monocytes Relative: 7 %
Neutro Abs: 19.2 10*3/uL — ABNORMAL HIGH (ref 1.7–7.7)
Neutrophils Relative %: 80 %
Platelets: 459 10*3/uL — ABNORMAL HIGH (ref 150–400)
RBC: 4.31 MIL/uL (ref 3.87–5.11)
RDW: 22.7 % — ABNORMAL HIGH (ref 11.5–15.5)
WBC: 23.9 10*3/uL — ABNORMAL HIGH (ref 4.0–10.5)
nRBC: 0 % (ref 0.0–0.2)

## 2021-04-13 LAB — BLOOD GAS, VENOUS
Acid-base deficit: 2.1 mmol/L — ABNORMAL HIGH (ref 0.0–2.0)
Bicarbonate: 25.7 mmol/L (ref 20.0–28.0)
O2 Saturation: 85.6 %
Patient temperature: 98.6
pCO2, Ven: 66 mmHg — ABNORMAL HIGH (ref 44.0–60.0)
pH, Ven: 7.214 — ABNORMAL LOW (ref 7.250–7.430)
pO2, Ven: 67 mmHg — ABNORMAL HIGH (ref 32.0–45.0)

## 2021-04-13 LAB — RAPID URINE DRUG SCREEN, HOSP PERFORMED
Amphetamines: POSITIVE — AB
Barbiturates: NOT DETECTED
Benzodiazepines: NOT DETECTED
Cocaine: POSITIVE — AB
Opiates: NOT DETECTED
Tetrahydrocannabinol: NOT DETECTED

## 2021-04-13 LAB — COMPREHENSIVE METABOLIC PANEL
ALT: 26 U/L (ref 0–44)
AST: 43 U/L — ABNORMAL HIGH (ref 15–41)
Albumin: 3.9 g/dL (ref 3.5–5.0)
Alkaline Phosphatase: 97 U/L (ref 38–126)
Anion gap: 11 (ref 5–15)
BUN: 33 mg/dL — ABNORMAL HIGH (ref 6–20)
CO2: 24 mmol/L (ref 22–32)
Calcium: 8.9 mg/dL (ref 8.9–10.3)
Chloride: 99 mmol/L (ref 98–111)
Creatinine, Ser: 1.4 mg/dL — ABNORMAL HIGH (ref 0.44–1.00)
GFR, Estimated: 47 mL/min — ABNORMAL LOW (ref >60)
Glucose, Bld: 39 mg/dL — CL (ref 70–99)
Potassium: 4.4 mmol/L (ref 3.5–5.1)
Sodium: 134 mmol/L — ABNORMAL LOW (ref 135–145)
Total Bilirubin: 0.8 mg/dL (ref 0.3–1.2)
Total Protein: 7.2 g/dL (ref 6.5–8.1)

## 2021-04-13 LAB — LACTIC ACID, PLASMA: Lactic Acid, Venous: 1.1 mmol/L (ref 0.5–1.9)

## 2021-04-13 LAB — D-DIMER, QUANTITATIVE: D-Dimer, Quant: 0.72 ug/mL-FEU — ABNORMAL HIGH (ref 0.00–0.50)

## 2021-04-13 LAB — ETHANOL: Alcohol, Ethyl (B): <10 mg/dL (ref <10)

## 2021-04-13 LAB — CBG MONITORING, ED
Glucose-Capillary: 122 mg/dL — ABNORMAL HIGH (ref 70–99)
Glucose-Capillary: 140 mg/dL — ABNORMAL HIGH (ref 70–99)
Glucose-Capillary: 42 mg/dL — CL (ref 70–99)

## 2021-04-13 LAB — AMMONIA: Ammonia: 45 umol/L — ABNORMAL HIGH (ref 9–35)

## 2021-04-13 LAB — RESP PANEL BY RT-PCR (FLU A&B, COVID) ARPGX2
Influenza A by PCR: NEGATIVE
Influenza B by PCR: NEGATIVE
SARS Coronavirus 2 by RT PCR: NEGATIVE

## 2021-04-13 LAB — SALICYLATE LEVEL: Salicylate Lvl: <7 mg/dL — ABNORMAL LOW (ref 7.0–30.0)

## 2021-04-13 LAB — ACETAMINOPHEN LEVEL: Acetaminophen (Tylenol), Serum: <10 ug/mL — ABNORMAL LOW (ref 10–30)

## 2021-04-13 MED ORDER — LORAZEPAM 2 MG/ML IJ SOLN
1.0000 mg | Freq: Once | INTRAMUSCULAR | Status: AC
  Administered 2021-04-13: 1 mg via INTRAVENOUS
  Filled 2021-04-13: qty 1

## 2021-04-13 MED ORDER — THIAMINE HCL 100 MG/ML IJ SOLN
100.0000 mg | Freq: Every day | INTRAMUSCULAR | Status: DC
  Administered 2021-04-13: 100 mg via INTRAVENOUS
  Filled 2021-04-13: qty 2

## 2021-04-13 MED ORDER — NALOXONE HCL 0.4 MG/ML IJ SOLN
INTRAMUSCULAR | Status: AC
  Administered 2021-04-13: 0.1 mg via INTRAVENOUS
  Filled 2021-04-13: qty 1

## 2021-04-13 MED ORDER — ACETAMINOPHEN 325 MG PO TABS: 650.0000 mg | ORAL_TABLET | Freq: Four times a day (QID) | ORAL | Status: DC | PRN

## 2021-04-13 MED ORDER — IPRATROPIUM-ALBUTEROL 0.5-2.5 (3) MG/3ML IN SOLN
3.0000 mL | Freq: Once | RESPIRATORY_TRACT | Status: AC
  Administered 2021-04-13: 3 mL via RESPIRATORY_TRACT
  Filled 2021-04-13: qty 3

## 2021-04-13 MED ORDER — DEXTROSE IN LACTATED RINGERS 5 % IV SOLN: INTRAVENOUS | Status: DC

## 2021-04-13 MED ORDER — NALOXONE HCL 0.4 MG/ML IJ SOLN: 0.1000 mg | Freq: Once | INTRAMUSCULAR | Status: AC

## 2021-04-13 MED ORDER — IPRATROPIUM-ALBUTEROL 0.5-2.5 (3) MG/3ML IN SOLN: 3.0000 mL | RESPIRATORY_TRACT | Status: DC | PRN

## 2021-04-13 MED ORDER — SODIUM CHLORIDE 0.9 % IV BOLUS
500.0000 mL | Freq: Once | INTRAVENOUS | Status: AC
  Administered 2021-04-13: 500 mL via INTRAVENOUS

## 2021-04-13 MED ORDER — SENNOSIDES-DOCUSATE SODIUM 8.6-50 MG PO TABS: 1.0000 | ORAL_TABLET | Freq: Every evening | ORAL | Status: DC | PRN

## 2021-04-13 MED ORDER — ACETAMINOPHEN 650 MG RE SUPP: 650.0000 mg | Freq: Four times a day (QID) | RECTAL | Status: DC | PRN

## 2021-04-13 MED ORDER — HALOPERIDOL LACTATE 5 MG/ML IJ SOLN: 5.0000 mg | Freq: Four times a day (QID) | INTRAMUSCULAR | Status: DC | PRN

## 2021-04-13 MED ORDER — ENOXAPARIN SODIUM 40 MG/0.4ML IJ SOSY
40.0000 mg | PREFILLED_SYRINGE | INTRAMUSCULAR | Status: DC
  Administered 2021-04-13: 40 mg via SUBCUTANEOUS
  Filled 2021-04-13: qty 0.4

## 2021-04-13 NOTE — ED Notes (Signed)
Pt states she wants to leave. MD aware. Pt signed AMA form. Pt able to walk with steady gait and is alert and oriented. Pt verbalizes understanding of risks of leaving the hospital and that the provider recommends hospital admission. Pt getting dressed at this time.

## 2021-04-13 NOTE — ED Provider Notes (Signed)
[place] DEPT Provider Note   CSN: [id number] Arrival date & time: 04/13/21  0044     History Chief Complaint  Patient presents with   Drug Overdose    Terri [name] is a 45 y.o. female.  The history is provided by the patient.  Terri [name] is a 45 y.o. female who presents to the Emergency Department complaining of altered mental status. Level V caveat due to AMS.  History is provided by patient and EMS. IRC called 911 because the patient was unresponsive per them. IRC was open this evening due to white flag conditions. On EMS arrival she was awake and talking. She does report using heroin around 6 PM. Patient has no complaints on ED arrival. EMS reports blood sugar of 63. She was treated with a Nutri-Grain bar PO prior to ED arrival. She did spit it out because she stated it was very dry. She reports recently being ill and hospitalized. She has currently recovered from her recent illness.    Past Medical History:  Diagnosis Date   Arthritis    Back and legs   Bipolar disorder (HCC)    Chronic back pain    Depression    DVT (deep venous thrombosis) (HCC)    early 20's leg   GIB (gastrointestinal bleeding) 09/13/2017   History of blood transfusion    Hypertension    Migraine headache    Neuropathy    Pneumonia    Positive PPD    x 2 last time 09/2017   Sciatica    Stress incontinence     Patient Active Problem List   Diagnosis Date Noted   Overdose 04/13/2021   Hypoxemia 04/13/2021   Hypoglycemia 04/13/2021   Asthma exacerbation 04/01/2021   Anemia 04/01/2021   Iron deficiency anemia due to chronic blood loss 03/18/2021   Hyponatremia 03/18/2021   Marginal ulcer 03/15/2021   Microcytic anemia 02/26/2021   Mild protein malnutrition ([place]) 02/26/2021   Polysubstance abuse ([place]) 01/16/2021   Abdominal pain 01/14/2021   Ulcer at site of surgical anastomosis following bypass of stomach 01/13/2021   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   Persistent fever 02/09/2019   COVID-19 virus not detected    Chest pain    Bacteremia due to Escherichia coli    Esophagitis    Hypokalemia    Hypomagnesemia    Sepsis ([place]) 02/02/2019   Leucocytosis 02/02/2019   Thrombocytosis 02/02/2019   UTI (urinary tract infection) 02/02/2019   Apnea 09/13/2017   Essential hypertension    Cellulitis and abscess of neck 08/12/2017   Cellulitis of submandibular region 08/10/2017   Odontogenic infection of jaw 08/10/2017   Cholecystitis 02/14/2017   Opioid abuse with opioid-induced mood disorder ([place]) 01/25/2017   Anxiety 02/11/2014   Depression 07/12/2012    Past Surgical History:  Procedure Laterality Date   ALVEOLOPLASTY Bilateral 01/31/2018   Procedure: ALVEOLOPLASTY;  Surgeon: Diona Browner, DDS;  Location: [facility];  Service: Oral Surgery;  Laterality: Bilateral;   ANKLE SURGERY Left 1992   with hardware   BACK SURGERY     BIOPSY  02/08/2019  Procedure: BIOPSY;  Surgeon: Irving Copas., MD;  Location: [facility];  Service: Gastroenterology;;   CESAREAN SECTION     CHOLECYSTECTOMY N/A 02/15/2017   Procedure: LAPAROSCOPIC CHOLECYSTECTOMY;  Surgeon: Clovis Riley, MD;  Location: [facility];  Service: General;  Laterality: N/A;   ENTEROSCOPY N/A 02/08/2019   Procedure: ENTEROSCOPY;  Surgeon: Rush Landmark Telford Nab., MD;  [facility];  Service: Gastroenterology;  Laterality: N/A;   ESOPHAGOGASTRODUODENOSCOPY N/A 01/17/2021   Procedure: ESOPHAGOGASTRODUODENOSCOPY (EGD);  Surgeon: Juanita Craver, MD;  [facility];  Service: Endoscopy;   Laterality: N/A;   ESOPHAGOGASTRODUODENOSCOPY (EGD) WITH PROPOFOL N/A 09/15/2017   Procedure: ESOPHAGOGASTRODUODENOSCOPY (EGD) WITH PROPOFOL;  Surgeon: Clarene Essex, MD;  [facility];  Service: Endoscopy;  Laterality: N/A;   ESOPHAGOGASTRODUODENOSCOPY (EGD) WITH PROPOFOL N/A 02/08/2019   Procedure: ESOPHAGOGASTRODUODENOSCOPY (EGD) WITH PROPOFOL;  Surgeon: Rush Landmark Telford Nab., MD;  [facility];  Service: Gastroenterology;  Laterality: N/A;   GASTRIC BYPASS     INCISION AND DRAINAGE OF PERITONSILLAR ABCESS Left 08/13/2017   Procedure: INCISION AND DRAINAGE OF LEFT NECK ABSCESS;  Surgeon: Melida Quitter, MD;  [facility]: [facility];  Service: ENT;  Laterality: Left;   LAPAROSCOPIC LYSIS OF ADHESIONS  02/15/2017   Procedure: LAPAROSCOPIC LYSIS OF ADHESIONS;  Surgeon: Clovis Riley, MD;  [facility];  Service: General;;   LUMBAR DISC SURGERY     LUMBAR FUSION     SAVORY DILATION N/A 02/08/2019   Procedure: SAVORY DILATION;  Surgeon: Irving Copas., MD;  [facility];  Service: Gastroenterology;  Laterality: N/A;   TOOTH EXTRACTION Bilateral 01/31/2018   Procedure: DENTAL RESTORATION/EXTRACTIONS;  Surgeon: Diona Browner, DDS;  Location: [facility];  Service: Oral Surgery;  Laterality: Bilateral;   TUBAL LIGATION     UPPER GI ENDOSCOPY N/A 02/15/2017   Procedure: UPPER GI ENDOSCOPY;  Surgeon: Clovis Riley, MD;  [facility];  Service: General;  Laterality: N/A;     OB History   No obstetric history on file.     Family History  Problem Relation Age of Onset   Diabetes Mother    Hypertension Mother    Diabetes Father    Hypertension Father     Social History   Tobacco Use   Smoking status: Every Day    Packs/day: 1.00    Years: 29.00    Pack years: 29.00    Types: Cigarettes   Smokeless tobacco: Never  Vaping Use   Vaping Use: Never used  Substance Use Topics   Alcohol use: No   Drug use: Yes    Types: IV, Cocaine    Home  Medications Prior to Admission medications   Medication Sig Start Date End Date Taking? Authorizing Provider  amLODipine (NORVASC) 10 MG tablet Take 1 tablet (10 mg total) by mouth daily. 04/06/21   Domenic Polite, MD  Ferrous Fumarate (IRON) 18 MG TBCR Take 1 tablet (18 mg total) by mouth 2 (two) times daily after a meal. 04/06/21   Domenic Polite, MD  hydrALAZINE (APRESOLINE) 50 MG tablet Take 1 tablet (50 mg total) by mouth 2 (two) times daily. 04/06/21   Domenic Polite, MD  hydrOXYzine (ATARAX/VISTARIL) 25 MG tablet Take 1 tablet (25 mg total) by mouth every 8 (eight) hours as needed for anxiety. 04/06/21   Domenic Polite, MD  lisinopril (ZESTRIL) 10 MG tablet Take 1 tablet (10 mg total) by mouth daily. 04/06/21   Domenic Polite, MD  methocarbamol (ROBAXIN) 750 MG tablet Take 1 tablet (750 mg total)  by mouth every 8 (eight) hours as needed for muscle spasms. 04/06/21   Domenic Polite, MD  nicotine (NICODERM CQ - DOSED IN MG/24 HOURS) 21 mg/24hr patch Place 1 patch (21 mg total) onto the skin daily. 04/07/21   Domenic Polite, MD  pantoprazole (PROTONIX) 40 MG tablet Take 1 tablet (40 mg total) by mouth daily. Patient not taking: Reported on 04/01/2021 03/18/21   Edwin Dada, MD  traMADol (ULTRAM) 50 MG tablet Take 1 tablet (50 mg total) by mouth every 6 (six) hours as needed for moderate pain. 04/06/21   Domenic Polite, MD  vitamin B-12 (CYANOCOBALAMIN) 1000 MCG tablet Take 1 tablet (1,000 mcg total) by mouth daily. 04/06/21   Domenic Polite, MD    Allergies    Nsaids, Ketorolac tromethamine, and Methocarbamol  Review of Systems   Review of Systems  All other systems reviewed and are negative.  Physical Exam Updated Vital Signs BP (!) 165/101   Pulse 86   Temp 98.2 F (36.8 C) (Oral)   Resp 12   Ht 5\' 7"  (1.702 m)   Wt 45.4 kg   SpO2 98%   BMI 15.66 kg/m   Physical Exam Vitals and nursing note reviewed.  Constitutional:      Appearance: She is  well-developed.     Comments: Awake and alert. Frequent large amplitude movements in the stretcher but she is easily retractable.  HENT:     Head: Normocephalic and atraumatic.  Cardiovascular:     Rate and Rhythm: Regular rhythm. Tachycardia present.     Heart sounds: No murmur heard. Pulmonary:     Effort: Pulmonary effort is normal. No respiratory distress.     Breath sounds: Normal breath sounds.  Abdominal:     Palpations: Abdomen is soft.     Tenderness: There is no abdominal tenderness. There is no guarding or rebound.  Musculoskeletal:        General: No tenderness.  Skin:    General: Skin is warm and dry.  Neurological:     Mental Status: She is alert.     Comments: MAE symmetrically and strongly, confused and disoriented.    Psychiatric:     Comments: Anxious appearing    ED Results / Procedures / Treatments   Labs (all labs ordered are listed, but only abnormal results are displayed) Labs Reviewed  COMPREHENSIVE METABOLIC PANEL - Abnormal; Notable for the following components:      Result Value   Sodium 134 (*)    Glucose, Bld 39 (*)    BUN 33 (*)    Creatinine, Ser 1.40 (*)    AST 43 (*)    GFR, Estimated 47 (*)    All other components within normal limits  CBC WITH DIFFERENTIAL/PLATELET - Abnormal; Notable for the following components:   WBC 23.9 (*)    Hemoglobin 9.7 (*)    HCT 30.3 (*)    MCV 70.3 (*)    MCH 22.5 (*)    RDW 22.7 (*)    Platelets 459 (*)    Neutro Abs 19.2 (*)    Monocytes Absolute 1.6 (*)    Abs Immature Granulocytes 0.12 (*)    All other components within normal limits  BLOOD GAS, VENOUS - Abnormal; Notable for the following components:   pH, Ven 7.214 (*)    pCO2, Ven 66.0 (*)    pO2, Ven 67.0 (*)    Acid-base deficit 2.1 (*)    All other components within normal limits  AMMONIA -  Abnormal; Notable for the following components:   Ammonia 45 (*)    All other components within normal limits  ACETAMINOPHEN LEVEL - Abnormal;  Notable for the following components:   Acetaminophen (Tylenol), Serum <10 (*)    All other components within normal limits  SALICYLATE LEVEL - Abnormal; Notable for the following components:   Salicylate Lvl <7.6 (*)    All other components within normal limits  RAPID URINE DRUG SCREEN, HOSP PERFORMED - Abnormal; Notable for the following components:   Cocaine POSITIVE (*)    Amphetamines POSITIVE (*)    All other components within normal limits  URINALYSIS, ROUTINE W REFLEX MICROSCOPIC - Abnormal; Notable for the following components:   APPearance HAZY (*)    Hgb urine dipstick SMALL (*)    Bacteria, UA RARE (*)    All other components within normal limits  CBG MONITORING, ED - Abnormal; Notable for the following components:   Glucose-Capillary 42 (*)    All other components within normal limits  CBG MONITORING, ED - Abnormal; Notable for the following components:   Glucose-Capillary 140 (*)    All other components within normal limits  CBG MONITORING, ED - Abnormal; Notable for the following components:   Glucose-Capillary 122 (*)    All other components within normal limits  CULTURE, BLOOD (SINGLE)  RESP PANEL BY RT-PCR (FLU A&B, COVID) [id number]  LACTIC ACID, PLASMA  ETHANOL  D-DIMER, QUANTITATIVE    EKG EKG Interpretation  Date/Time:  Monday April 13 2021 00:54:11 EST Ventricular Rate:  113 PR Interval:  156 QRS Duration: 84 QT Interval:  343 QTC Calculation: 471 R Axis:   83 Text Interpretation: Sinus tachycardia Left ventricular hypertrophy Artifact in lead(s) I III aVL and baseline wander in lead(s) V3 Partial missing lead(s): V3 Poor data quality Confirmed by Quintella Reichert [phone]) on 04/13/2021 2:16:29 AM  Radiology CT Head Wo Contrast  Result Date: 04/13/2021 CLINICAL DATA:  Altered mental status. EXAM: CT HEAD WITHOUT CONTRAST TECHNIQUE: Contiguous axial images were obtained from the base of the skull through the vertex without intravenous contrast.  COMPARISON:  Head CT 12/28/2012. FINDINGS: Brain: No evidence of acute infarction, hemorrhage, hydrocephalus, extra-axial collection or mass lesion/mass effect. Vascular: There are interval increased calcifications in the distal left vertebral artery and both siphons but no hyperdense central vasculature. Skull: Normal. Negative for fracture or focal lesion. Sinuses/Orbits: No acute finding. Other: None. IMPRESSION: No acute intracranial CT findings. The only significant interval change since 2014 is that the vascular calcifications are greater in the distal left vertebral artery and siphons. Electronically Signed   By: Telford Nab M.D.   On: 04/13/2021 04:18   DG Chest Port 1 View  Result Date: 04/13/2021 CLINICAL DATA:  Altered mental status EXAM: PORTABLE CHEST 1 VIEW COMPARISON:  04/01/2021 FINDINGS: The heart size and mediastinal contours are within normal limits. Both lungs are clear. The visualized skeletal structures are unremarkable. IMPRESSION: No active disease. Electronically Signed   By: Inez Catalina M.D.   On: 04/13/2021 02:31    Procedures Procedures  CRITICAL CARE Performed by: Quintella Reichert   Total critical care time: 35 minutes  Critical care time was exclusive of separately billable procedures and treating other patients.  Critical care was necessary to treat or prevent imminent or life-threatening deterioration.  Critical care was time spent personally by me on the following activities: development of treatment plan with patient and/or surrogate as well as nursing, discussions with consultants, evaluation of patient's response to treatment, examination  of patient, obtaining history from patient or surrogate, ordering and performing treatments and interventions, ordering and review of laboratory studies, ordering and review of radiographic studies, pulse oximetry and re-evaluation of patient's condition.  Medications Ordered in ED Medications  thiamine (B-1) injection  100 mg (has no administration in time range)  enoxaparin (LOVENOX) injection 40 mg (has no administration in time range)  dextrose 5 % in lactated ringers infusion ( Intravenous New Bag/Given 04/13/21 0545)  acetaminophen (TYLENOL) tablet 650 mg (has no administration in time range)    Or  acetaminophen (TYLENOL) suppository 650 mg (has no administration in time range)  senna-docusate (Senokot-S) tablet 1 tablet (has no administration in time range)  ipratropium-albuterol (DUONEB) 0.5-2.5 (3) MG/3ML nebulizer solution 3 mL (has no administration in time range)  sodium chloride 0.9 % bolus 500 mL (0 mLs Intravenous Stopped 04/13/21 0308)  LORazepam (ATIVAN) injection 1 mg (1 mg Intravenous Given 04/13/21 0228)  ipratropium-albuterol (DUONEB) 0.5-2.5 (3) MG/3ML nebulizer solution 3 mL (3 mLs Nebulization Given 04/13/21 0315)  naloxone (NARCAN) injection 0.1 mg (0.1 mg Intravenous Given 04/13/21 3846)    ED Course  I have reviewed the triage vital signs and the nursing notes.  Pertinent labs & imaging results that were available during my care of the patient were reviewed by me and considered in my medical decision making (see chart for details).    MDM Rules/Calculators/A&P                          patient [facility] from [facility] following episode of unresponsiveness, on ED arrival patient is alert, agitated, mildly hypoxic. She is also found to be hypoglycemic. She was treated with oral orange juice with improvement in her blood sugar but she had persistent agitation. She did require Ativan for her agitation. After Ativan administration patient became increasingly somnolent and difficult to arouse. She was treated with a dose of Narcan with improvement in her mental status but return of her agitation. After the Narcan were off patient became somnolent again, maintaining her oxygen stats on 2 L nasal cannula. Given her altered mental status in setting of polysubstance abuse recommend observation  admission. Labs significant for acute kidney injury when compared to prior. CBC was leukocytosis, likely [place] secondary to drug use. No evidence of acute infection at this time. Hospitalist consulted for admission for ongoing treatment.  Final Clinical Impression(s) / ED Diagnoses Final diagnoses:  Polysubstance abuse ([place])  Accidental drug overdose, initial encounter  Somnolence    Rx / DC Orders ED Discharge Orders     None        Quintella Reichert, MD 04/13/21 [phone]

## 2021-04-13 NOTE — ED Triage Notes (Signed)
Patient brought in by EMS. [facility] states patient was unresponsive. When EMS arrived patient was sitting up and talking. Patient reports using heroin at 6pm. No other complaints.

## 2021-04-13 NOTE — ED Notes (Signed)
Patient given sandwich and orange juice.

## 2021-04-13 NOTE — ED Notes (Signed)
Patient screaming, rolling around in the bed. Pulling off her oxygen, cardiac leads, pulse oximetry. Not listening. Not cooperating. Patient reminded of the importance of keeping monitoring devices on and oxygen in place.

## 2021-04-13 NOTE — ED Notes (Signed)
Pt. CBG 42, RN, Lysbeth Galas made aware.

## 2021-04-13 NOTE — ED Notes (Signed)
MD at bedside. 

## 2021-04-13 NOTE — H&P (Signed)
History and Physical    Terri [name] WUX:[id number] DOB: August 21, 1975 DOA: 04/13/2021  PCP: Pcp, No   Patient coming from: Shelter  Chief Complaint: AMS,   HPI: Terri [name] is a 45 y.o. female with medical history significant for polysubstance abuse, HTN, bipolar disorder who presents by EMS from local shelter for being unresponsive.  She has a history of polysubstance abuse which she has been admitted for in the past.  Reportedly she was at the shelter and was confused and acting irrationally and then became unresponsive so EMS was called.  She was found by EMS of a blood sugar of 63 and patient was 43 when she arrived at the emergency room patient was talking.  She then would drift off to sleep and was given a small dose of Narcan in the emergency room which aroused her briefly.  She then became agitated and combative with staff and was given Ativan.  She has been sleepy and somnolent since that time.  She was given p.o. juice and Nutrigrain bar and blood sugar improved.  Reportedly she used heroin around 6 PM last night.  She is not able to provide any history.  ED Course: She has been hemodynamically stable in the emergency room.  She did have hypoxia on room air and was placed on oxygen by nasal cannula with improvement in her oxygen saturation.  She had an elevated white blood cell count on labs but no signs or symptoms of infection.  She has been afebrile.  Requested a D-dimer be obtained and if it is elevated will obtain CTA to make shows no pulmonary ballismus source of hypoxia.  Lactic acid level 1.1.  Ammonia 45.  VBG shows pH 7.214 PCO2 66 PO2 67 bicarb 25.7.  Sodium 134 potassium 4.4 chloride 99 bicarb 24 creatinine 1.40 BUN 33 alkaline phosphatase 97 AST 43 ALT 26.  Glucose 39.  Fingerstick blood glucose after p.o. intake is 140.  DC 20,900 hemoglobin 9.7 hematocrit 30.3 platelets 459,000.  COVID-negative.  Influenza a and B are negative.  Review of Systems:  Unable to obtain review of  systems secondary to acute condition  Past Medical History:  Diagnosis Date   Arthritis    Back and legs   Bipolar disorder (HCC)    Chronic back pain    Depression    DVT (deep venous thrombosis) (HCC)    early 20's leg   GIB (gastrointestinal bleeding) 09/13/2017   History of blood transfusion    Hypertension    Migraine headache    Neuropathy    Pneumonia    Positive PPD    x 2 last time 09/2017   Sciatica    Stress incontinence     Past Surgical History:  Procedure Laterality Date   ALVEOLOPLASTY Bilateral 01/31/2018   Procedure: ALVEOLOPLASTY;  Surgeon: Diona Browner, DDS;  Location: [facility];  Service: Oral Surgery;  Laterality: Bilateral;   ANKLE SURGERY Left 1992   with hardware   BACK SURGERY     BIOPSY  02/08/2019   Procedure: BIOPSY;  Surgeon: Rush Landmark Telford Nab., MD;  Location: [facility];  Service: Gastroenterology;;   CESAREAN SECTION     CHOLECYSTECTOMY N/A 02/15/2017   Procedure: LAPAROSCOPIC CHOLECYSTECTOMY;  Surgeon: Clovis Riley, MD;  Location: [facility];  Service: General;  Laterality: N/A;   ENTEROSCOPY N/A 02/08/2019   Procedure: ENTEROSCOPY;  Surgeon: Rush Landmark Telford Nab., MD;  Location: [facility];  Service: Gastroenterology;  Laterality: N/A;   ESOPHAGOGASTRODUODENOSCOPY N/A 01/17/2021  Procedure: ESOPHAGOGASTRODUODENOSCOPY (EGD);  Surgeon: Juanita Craver, MD;  Location: [facility];  Service: Endoscopy;  Laterality: N/A;   ESOPHAGOGASTRODUODENOSCOPY (EGD) WITH PROPOFOL N/A 09/15/2017   Procedure: ESOPHAGOGASTRODUODENOSCOPY (EGD) WITH PROPOFOL;  Surgeon: Clarene Essex, MD;  Location: [facility];  Service: Endoscopy;  Laterality: N/A;   ESOPHAGOGASTRODUODENOSCOPY (EGD) WITH PROPOFOL N/A 02/08/2019   Procedure: ESOPHAGOGASTRODUODENOSCOPY (EGD) WITH PROPOFOL;  Surgeon: Rush Landmark Telford Nab., MD;  Location: [facility];  Service: Gastroenterology;  Laterality: N/A;   GASTRIC BYPASS     INCISION AND DRAINAGE OF PERITONSILLAR ABCESS Left 08/13/2017    Procedure: INCISION AND DRAINAGE OF LEFT NECK ABSCESS;  Surgeon: Melida Quitter, MD;  Location: [facility];  Service: ENT;  Laterality: Left;   LAPAROSCOPIC LYSIS OF ADHESIONS  02/15/2017   Procedure: LAPAROSCOPIC LYSIS OF ADHESIONS;  Surgeon: Clovis Riley, MD;  Location: [facility];  Service: General;;   LUMBAR DISC SURGERY     LUMBAR FUSION     SAVORY DILATION N/A 02/08/2019   Procedure: SAVORY DILATION;  Surgeon: Irving Copas., MD;  Location: [facility];  Service: Gastroenterology;  Laterality: N/A;   TOOTH EXTRACTION Bilateral 01/31/2018   Procedure: DENTAL RESTORATION/EXTRACTIONS;  Surgeon: Diona Browner, DDS;  Location: [place];  Service: Oral Surgery;  Laterality: Bilateral;   TUBAL LIGATION     UPPER GI ENDOSCOPY N/A 02/15/2017   Procedure: UPPER GI ENDOSCOPY;  Surgeon: Clovis Riley, MD;  Location: [facility];  Service: General;  Laterality: N/A;    Social History  reports that she has been smoking cigarettes. She has a 29.00 pack-year smoking history. She has never used smokeless tobacco. She reports current drug use. Drugs: IV and Cocaine. She reports that she does not drink alcohol.  Allergies  Allergen Reactions   Nsaids Other (See Comments)    Stomach ulcer    Ketorolac Tromethamine Itching and Nausea And Vomiting   Methocarbamol Diarrhea    Family History  Problem Relation Age of Onset   Diabetes Mother    Hypertension Mother    Diabetes Father    Hypertension Father      Prior to Admission medications   Medication Sig Start Date End Date Taking? Authorizing Provider  amLODipine (NORVASC) 10 MG tablet Take 1 tablet (10 mg total) by mouth daily. 04/06/21   Domenic Polite, MD  Ferrous Fumarate (IRON) 18 MG TBCR Take 1 tablet (18 mg total) by mouth 2 (two) times daily after a meal. 04/06/21   Domenic Polite, MD  hydrALAZINE (APRESOLINE) 50 MG tablet Take 1 tablet (50 mg total) by mouth 2 (two) times daily. 04/06/21   Domenic Polite, MD  hydrOXYzine  (ATARAX/VISTARIL) 25 MG tablet Take 1 tablet (25 mg total) by mouth every 8 (eight) hours as needed for anxiety. 04/06/21   Domenic Polite, MD  lisinopril (ZESTRIL) 10 MG tablet Take 1 tablet (10 mg total) by mouth daily. 04/06/21   Domenic Polite, MD  methocarbamol (ROBAXIN) 750 MG tablet Take 1 tablet (750 mg total) by mouth every 8 (eight) hours as needed for muscle spasms. 04/06/21   Domenic Polite, MD  nicotine (NICODERM CQ - DOSED IN MG/24 HOURS) 21 mg/24hr patch Place 1 patch (21 mg total) onto the skin daily. 04/07/21   Domenic Polite, MD  pantoprazole (PROTONIX) 40 MG tablet Take 1 tablet (40 mg total) by mouth daily. Patient not taking: Reported on 04/01/2021 03/18/21   Edwin Dada, MD  traMADol (ULTRAM) 50 MG tablet Take 1 tablet (50 mg total) by mouth every 6 (six) hours  as needed for moderate pain. 04/06/21   Domenic Polite, MD  vitamin B-12 (CYANOCOBALAMIN) 1000 MCG tablet Take 1 tablet (1,000 mcg total) by mouth daily. 04/06/21   Domenic Polite, MD    Physical Exam: Vitals:   04/13/21 0410 04/13/21 0411 04/13/21 0500 04/13/21 0515  BP: (!) 142/91 (!) 142/91 (!) 165/101   Pulse: 90 88 89 86  Resp: 14 10 (!) 7   Temp:  98.2 F (36.8 C)    TempSrc:  Oral    SpO2: 94%  97% 98%  Weight:      Height:        Constitutional: Thin frail appearing female Vitals:   04/13/21 0410 04/13/21 0411 04/13/21 0500 04/13/21 0515  BP: (!) 142/91 (!) 142/91 (!) 165/101   Pulse: 90 88 89 86  Resp: 14 10 (!) 7   Temp:  98.2 F (36.8 C)    TempSrc:  Oral    SpO2: 94%  97% 98%  Weight:      Height:       General: Appears older than stated age.  Cachectic appearing.  Somnolent Eyes: PERRL, conjunctivae normal.  Sclera nonicteric HENT:  [place]/AT, external ears normal.  Nares patent without epistasis.  Mucous membranes are moist.  Edentulous Neck: Soft, normal passive range of motion, supple, no masses, Trachea midline Respiratory: Equal but shallow breath sounds.  Mild  diffuse Rales.  No wheezing, no crackles. Normal respiratory effort. No accessory muscle use.  Cardiovascular: Regular rate and rhythm, no murmurs / rubs / gallops. No extremity edema. 2+ pedal pulses. Abdomen: Soft, nondistended, no rebound or guarding.  No masses palpated. No hepatosplenomegaly. Bowel sounds normoactive Musculoskeletal: FROM. no cyanosis. No joint deformity upper and lower extremities. Normal muscle tone.  Skin: Warm, dry, intact no rashes, lesions, ulcers. No induration Neurologic: Moves all extremities spontaneously.  Patellar DTRs +1 bilaterally.  Withdraws from painful stimuli.   Labs on Admission: I have personally reviewed following labs and imaging studies  CBC: Recent Labs  Lab 04/13/21 0101  WBC 23.9*  NEUTROABS 19.2*  HGB 9.7*  HCT 30.3*  MCV 70.3*  PLT 459*    Basic Metabolic Panel: Recent Labs  Lab 04/13/21 0101  NA 134*  K 4.4  CL 99  CO2 24  GLUCOSE 39*  BUN 33*  CREATININE 1.40*  CALCIUM 8.9    GFR: Estimated Creatinine Clearance: 35.6 mL/min (A) (by C-G formula based on SCr of 1.4 mg/dL (H)).  Liver Function Tests: Recent Labs  Lab 04/13/21 0101  AST 43*  ALT 26  ALKPHOS 97  BILITOT 0.8  PROT 7.2  ALBUMIN 3.9    Urine analysis:    Component Value Date/Time   COLORURINE YELLOW 04/13/2021 0400   APPEARANCEUR HAZY (A) 04/13/2021 0400   LABSPEC 1.010 04/13/2021 0400   PHURINE 5.0 04/13/2021 0400   GLUCOSEU NEGATIVE 04/13/2021 0400   HGBUR SMALL (A) 04/13/2021 0400   BILIRUBINUR NEGATIVE 04/13/2021 0400   KETONESUR NEGATIVE 04/13/2021 0400   PROTEINUR NEGATIVE 04/13/2021 0400   UROBILINOGEN 1.0 02/09/2015 1228   NITRITE NEGATIVE 04/13/2021 0400   LEUKOCYTESUR NEGATIVE 04/13/2021 0400    Radiological Exams on Admission: CT Head Wo Contrast  Result Date: 04/13/2021 CLINICAL DATA:  Altered mental status. EXAM: CT HEAD WITHOUT CONTRAST TECHNIQUE: Contiguous axial images were obtained from the base of the skull through  the vertex without intravenous contrast. COMPARISON:  Head CT 12/28/2012. FINDINGS: Brain: No evidence of acute infarction, hemorrhage, hydrocephalus, extra-axial collection or mass lesion/mass effect. Vascular:  There are interval increased calcifications in the distal left vertebral artery and both siphons but no hyperdense central vasculature. Skull: Normal. Negative for fracture or focal lesion. Sinuses/Orbits: No acute finding. Other: None. IMPRESSION: No acute intracranial CT findings. The only significant interval change since 2014 is that the vascular calcifications are greater in the distal left vertebral artery and siphons. Electronically Signed   By: Telford Nab M.D.   On: 04/13/2021 04:18   DG Chest Port 1 View  Result Date: 04/13/2021 CLINICAL DATA:  Altered mental status EXAM: PORTABLE CHEST 1 VIEW COMPARISON:  04/01/2021 FINDINGS: The heart size and mediastinal contours are within normal limits. Both lungs are clear. The visualized skeletal structures are unremarkable. IMPRESSION: No active disease. Electronically Signed   By: Inez Catalina M.D.   On: 04/13/2021 02:31    EKG: Independently reviewed.  EKG shows sinus tachycardia with LVH by voltage criteria.  No acute ST elevation or depression.  QTc borderline prolonged at 471  Assessment/Plan Principal Problem:   Overdose Ms. Kasprzak has hx of polysubstance abuse and presented with amphetamine and THC in [facility]. Had mild improvement with narcan but became agitated and combative in the ER and was given Ativan.  She is now sleeping and somnolent. Has history of heroin and cocaine abuse and reportedly used both prior to EMS being called by the homeless shelter. Will allow to metabolize the drugs to clear her system as she is hemodynamically stable at this time  Active Problems:   AKI (acute kidney injury)  IV fluid hydration with D5 LR Recheck electrolytes and renal function in morning    Hypoxemia Patient requiring oxygen by nasal  cannula to maintain O2 sats above 92%. D-dimer has been ordered and is pending and if positive will need CTA chest    Essential hypertension Monitor BP and if BP elevates above SBP of 170 will give IV hydralazine    Leucocytosis WBC elevated but no signs or symptoms of infection. CXR and UA negative. Probable left shift from drug use Recheck CBC in am    Thrombocytosis Chronic. Monitor. Lovenox for DVT prophylaxis    Hypoglycemia Initial blood sugar was low at 35. Improved with oral intake.  Patient not too somnolent to take p.o. intake.  Will provide D5 LR at 75 ml/hr    Polysubstance abuse  Chronic  DVT prophylaxis: Lovenox for DVT prophylaxis Code Status:   Full Code  Family Communication:  No family at bedside. Pt lethargic and somnolent Disposition Plan:   Patient is from:  Homeless shelter  Anticipated DC to:  To be determined  Anticipated DC date:  Anticipate 2 midnight or more stay  Admission status:  Inpatient   Yevonne Aline Kathlyn Leachman MD [facility]  How to contact the [facility] Attending or Consulting provider [place] or covering provider during after hours [place], for this patient?   Check the care team in [facility] and look for a) attending/consulting [facility] provider listed and b) the [facility] team listed Log into [email or web address] and use [place]'s universal password to access. If you do not have the password, please contact the hospital operator. Locate the [facility] provider you are looking for under [facility] and page to a number that you can be directly reached. If you still have difficulty reaching the provider, please page the [facility] (Director on Call) for the Hospitalists listed on amion for assistance.  04/13/2021, 5:34 AM

## 2021-04-13 NOTE — ED Notes (Signed)
Placed nonskid socks on patient, yellow armband on patient for high fall risk, bed alarm on.

## 2021-04-13 NOTE — Discharge Summary (Signed)
Physician Discharge Summary  Terri [name] WJX:[id number] DOB: [birth year] DOA: 04/13/2021  PCP: Merryl Hacker, No  Admit date: 04/13/2021 Discharge date: 04/13/2021  Patient left AGAINST MEDICAL ADVICE  Brief/Interim Summary: 45 y.o. female with medical history significant for polysubstance abuse, HTN, bipolar disorder who presents by EMS from local shelter for being unresponsive.  She has a history of polysubstance abuse which she has been admitted for in the past.  Reportedly she was at the shelter and was confused and acting irrationally and then became unresponsive so EMS was called.  She was found by EMS of a blood sugar of 63 and patient was 43 when she arrived at the emergency room patient was talking.  She then would drift off to sleep and was given a small dose of Narcan in the emergency room which aroused her briefly.  She then became agitated and combative with staff and was given Ativan.  She has been sleepy and somnolent since that time.  She was given p.o. juice and Nutrigrain bar and blood sugar improved.  Reportedly she used heroin around 6 PM last night.  Discharge Diagnoses:  Principal Problem:   Overdose Active Problems:   Essential hypertension   Leucocytosis   Thrombocytosis   AKI (acute kidney injury) ([place])   Polysubstance abuse ([place])   Hypoxemia   Hypoglycemia  Principal Problem: Polysubstance abuse with overdose Patient presented with amphetamine and THC in UDS.  Had mild improvement with narcan but became agitated and combative in the ER and was given Ativan.   The following morning, patient was more alert, oriented x3, seems to have insight to her care as determined by questioning in presence of bedside nurse and staff  Despite understanding and need for continued hospital stay, patient elected to leave [facility], see nursing documentation   Active Problems:   AKI (acute kidney injury)  Given IV fluid hydration with D5 LR      Hypoxemia Patient  requiring oxygen by nasal cannula to maintain O2 sats above 92%.     Essential hypertension Monitor BP and if BP elevates above SBP of 170 will give IV hydrala systolic blood pressures noted to be stable although diastolic blood pressures were somewhat elevated  Patient was continued on as needed hydralazine     Leucocytosis WBC elevated but no signs or symptoms of infection. CXR and UA negative.  Suspect elevated WBC in setting of active drug abuse     Thrombocytosis Chronic. Monitor. Lovenox for DVT prophylaxis     Hypoglycemia Initial blood sugar was low at 35. Improved with oral intake.  Patient was given D5 fluids Diet was resumed     Polysubstance abuse  Chronic    Discharge Instructions     Allergies  Allergen Reactions   Nsaids Other (See Comments)    Stomach ulcer    Ketorolac Tromethamine Itching and Nausea And Vomiting   Methocarbamol Diarrhea    Procedures/Studies: DG Chest 2 View  Result Date: 03/30/2021 CLINICAL DATA:  Shortness of breath. EXAM: CHEST - 2 VIEW COMPARISON:  03/12/2021. FINDINGS: The heart size and mediastinal contours are within normal limits. Hyperinflation of the lungs is noted. No consolidation, effusion, or pneumothorax. Old rib fractures are noted on the left with bone callus formation. IMPRESSION: Hyperinflation of the lungs with no acute process. Electronically Signed   By: Brett Fairy M.D.   On: 03/30/2021 20:05   CT Head Wo Contrast  Result Date: 04/13/2021 CLINICAL DATA:  Altered mental status. EXAM: CT HEAD  WITHOUT CONTRAST TECHNIQUE: Contiguous axial images were obtained from the base of the skull through the vertex without intravenous contrast. COMPARISON:  Head CT 12/28/2012. FINDINGS: Brain: No evidence of acute infarction, hemorrhage, hydrocephalus, extra-axial collection or mass lesion/mass effect. Vascular: There are interval increased calcifications in the distal left vertebral artery and both siphons but no hyperdense  central vasculature. Skull: Normal. Negative for fracture or focal lesion. Sinuses/Orbits: No acute finding. Other: None. IMPRESSION: No acute intracranial CT findings. The only significant interval change since 2014 is that the vascular calcifications are greater in the distal left vertebral artery and siphons. Electronically Signed   By: Telford Nab M.D.   On: 04/13/2021 04:18   CT Angio Chest Pulmonary Embolism (PE) W or WO Contrast  Result Date: 04/02/2021 CLINICAL DATA:  Wheezing and shortness of breath. Recent cocaine use. EXAM: CT ANGIOGRAPHY CHEST WITH CONTRAST TECHNIQUE: Multidetector CT imaging of the chest was performed using the standard protocol during bolus administration of intravenous contrast. Multiplanar CT image reconstructions and MIPs were obtained to evaluate the vascular anatomy. CONTRAST:  3mL OMNIPAQUE IOHEXOL 350 MG/ML SOLN COMPARISON:  02/26/2021 FINDINGS: Cardiovascular: Satisfactory opacification of the pulmonary arteries to the segmental level. No evidence of pulmonary embolism. Normal heart size. No pericardial effusion. Age advanced atherosclerosis of the aorta and coronaries. Mediastinum/Nodes: Negative for adenopathy or mass Lungs/Pleura: Generalized airway thickening patchy reticulation and micro nodularity. The micro nodularity is greatest in the apical lungs, question smoking-related respiratory bronchiolitis. Upper Abdomen: Gastric bypass with persisting gas collection near the gastroenteric anastomosis, most consistent with marginal ulcer. This gaseous collection measures up to 2.7 cm in diameter. No pneumoperitoneum. Musculoskeletal: No acute finding Review of the MIP images confirms the above findings. IMPRESSION: 1. Negative for pulmonary embolism. 2. Bronchitis and bronchiolitis. 3. Gastric bypass with marginal ulcer that is known from prior CTs. Electronically Signed   By: Jorje Guild M.D.   On: 04/02/2021 05:21   CT ABDOMEN PELVIS W CONTRAST  Result Date:  03/15/2021 CLINICAL DATA:  Nonlocalized acute abdominal pain. Abdomen pain lower cramping Pelvic inflammatory disease and STD per patient. EXAM: CT ABDOMEN AND PELVIS WITH CONTRAST TECHNIQUE: Multidetector CT imaging of the abdomen and pelvis was performed using the standard protocol following bolus administration of intravenous contrast. CONTRAST:  2mL OMNIPAQUE IOHEXOL 350 MG/ML SOLN COMPARISON:  CT abdomen pelvis 02/26/2021 FINDINGS: Lower chest: Bibasilar linear atelectasis versus scarring. Hepatobiliary: Indeterminate oval 1.1 cm right hepatic lobe hypodensity (2:22). Status post cholecystectomy. No biliary dilatation. Pancreas: No focal lesion. Normal pancreatic contour. No surrounding inflammatory changes. No main pancreatic ductal dilatation. Spleen: Normal in size without focal abnormality. Adrenals/Urinary Tract: No adrenal nodule bilaterally. Bilateral kidneys enhance symmetrically. No hydronephrosis. No hydroureter. The urinary bladder is unremarkable. On delayed imaging, there is no urothelial wall thickening and there are no filling defects in the opacified portions of the bilateral collecting systems or ureters. Stomach/Bowel: Gastric Roux-en-Y surgical bypass surgical changes. Stomach is within normal limits. No evidence of bowel wall thickening or dilatation. Stool throughout the colon. The appendix not definitely identified. Vascular/Lymphatic: No abdominal aorta or iliac aneurysm. Severe atherosclerotic plaque of the aorta and its branches. No abdominal, pelvic, or inguinal lymphadenopathy. Reproductive: Markedly limited evaluation of the pelvis due to lack of intraperitoneal fat. Uterus and bilateral adnexa are unremarkable. Other: No intraperitoneal free fluid. No intraperitoneal free gas. No organized fluid collection. Musculoskeletal: Several tiny foci of gas along the subcutaneus soft tissues of the bilateral labia majora. No suspicious lytic or blastic osseous lesions.  No acute displaced  fracture. Multilevel degenerative changes of the spine. IMPRESSION: 1. Several tiny foci of gas along the subcutaneus soft tissues of the bilateral labia majora. Gas could be within folds external to the soft tissues but this is unlikely given the distribution on coronal imaging. No organized fluid collection. Necrotizing fasciitis cannot be excluded as this is a clinical diagnosis. Recommend correlation with physical exam. 2. No acute intra-abdominal or intrapelvic abnormality with slightly limited evaluation due to lack of intraperitoneal fat. Given history, please consider pelvic ultrasound if clinically indicated. 3. Stool throughout the colon. Electronically Signed   By: Iven Finn M.D.   On: 03/15/2021 18:00   DG Chest Port 1 View  Result Date: 04/13/2021 CLINICAL DATA:  Altered mental status EXAM: PORTABLE CHEST 1 VIEW COMPARISON:  04/01/2021 FINDINGS: The heart size and mediastinal contours are within normal limits. Both lungs are clear. The visualized skeletal structures are unremarkable. IMPRESSION: No active disease. Electronically Signed   By: Inez Catalina M.D.   On: 04/13/2021 02:31   DG Chest Port 1 View  Result Date: 04/01/2021 CLINICAL DATA:  Shortness of breath, cough x3 days EXAM: PORTABLE CHEST 1 VIEW COMPARISON:  03/30/2021 FINDINGS: Lungs are clear.  No pleural effusion or pneumothorax. The heart is normal in size. IMPRESSION: No evidence of acute cardiopulmonary disease. Electronically Signed   By: Julian Hy M.D.   On: 04/01/2021 19:07    Subjective: Seen this morning, wanted to have something to eat, had been walking to the bathroom assisted by staff  Discharge Exam: Vitals:   04/13/21 1020 04/13/21 1021  BP: (!) 129/100 (!) 129/100  Pulse: 80 72  Resp:  11  Temp:    SpO2: 100% 100%   Vitals:   04/13/21 0630 04/13/21 0700 04/13/21 1020 04/13/21 1021  BP: (!) 151/82 (!) 162/88 (!) 129/100 (!) 129/100  Pulse: 90 83 80 72  Resp: 20 11  11   Temp:       TempSrc:      SpO2: 94% 97% 100% 100%  Weight:      Height:        General: Pt is alert, awake, not in acute distress Cardiovascular: RRR, S1/S2 + Respiratory: CTA bilaterally, no wheezing, no rhonchi Abdominal: Soft, NT, ND, bowel sounds + Extremities: no edema, no cyanosis   The results of significant diagnostics from this hospitalization (including imaging, microbiology, ancillary and laboratory) are listed below for reference.     Microbiology: Recent Results (from the past 240 hour(s))  Culture, blood (single)     Status: None (Preliminary result)   Collection Time: 04/13/21  2:24 AM   Specimen: BLOOD RIGHT FOREARM  Result Value Ref Range Status   Specimen Description   Final    BLOOD RIGHT FOREARM Performed at [facility], [address], [place], [place] [address]    Special Requests   Final    BOTTLES DRAWN AEROBIC AND ANAEROBIC Blood Culture adequate volume Performed at [facility] [address] [place], [place] [address]    Culture PENDING  Incomplete   Report Status PENDING  Incomplete  Resp Panel by RT-PCR (Flu A&B, Covid) Nasopharyngeal Swab     Status: None   Collection Time: 04/13/21  5:10 AM   Specimen: Nasopharyngeal Swab; Nasopharyngeal(NP) swabs in vial transport medium  Result Value Ref Range Status   SARS Coronavirus 2 by RT PCR NEGATIVE NEGATIVE Final    Comment: (NOTE) SARS-CoV-2 target nucleic acids are NOT DETECTED.  The SARS-CoV-2 RNA is generally detectable in upper respiratory specimens during the acute phase of infection. The lowest concentration of SARS-CoV-2 viral copies this assay can detect is 138 copies/mL. A negative result does not preclude SARS-Cov-2 infection and should not be used as the sole basis for treatment or other patient management decisions. A negative result may occur with  improper specimen collection/handling, submission of specimen other than nasopharyngeal swab, presence of viral  mutation(s) within the areas targeted by this assay, and inadequate number of viral copies(<138 copies/mL). A negative result must be combined with clinical observations, patient history, and epidemiological information. The expected result is Negative.  Fact Sheet for Patients:  EntrepreneurPulse.com.au  Fact Sheet for Healthcare Providers:  IncredibleEmployment.be  This test is no t yet approved or cleared by the [place] FDA and  has been authorized for detection and/or diagnosis of SARS-CoV-2 by FDA under an Emergency Use Authorization (EUA). This EUA will remain  in effect (meaning this test can be used) for the duration of the COVID-19 declaration under Section 564(b)(1) of the Act, 21 U.S.C.section 360bbb-3(b)(1), unless the authorization is terminated  or revoked sooner.       Influenza A by PCR NEGATIVE NEGATIVE Final   Influenza B by PCR NEGATIVE NEGATIVE Final    Comment: (NOTE) The Xpert Xpress SARS-CoV-2/FLU/RSV plus assay is intended as an aid in the diagnosis of influenza from Nasopharyngeal swab specimens and should not be used as a sole basis for treatment. Nasal washings and aspirates are unacceptable for Xpert Xpress SARS-CoV-2/FLU/RSV testing.  Fact Sheet for Patients: EntrepreneurPulse.com.au  Fact Sheet for Healthcare Providers: IncredibleEmployment.be  This test is not yet approved or cleared by the [place] FDA and has been authorized for detection and/or diagnosis of SARS-CoV-2 by FDA under an Emergency Use Authorization (EUA). This EUA will remain in effect (meaning this test can be used) for the duration of the COVID-19 declaration under Section 564(b)(1) of the Act, 21 U.S.C. section 360bbb-3(b)(1), unless the authorization is terminated or revoked.  Performed at [facility], [place] [address], [place], [place] [address]      Labs: BNP (last 3  results) No results for input(s): BNP in the last 8760 hours. Basic Metabolic Panel: Recent Labs  Lab 04/13/21 0101  NA 134*  K 4.4  CL 99  CO2 24  GLUCOSE 39*  BUN 33*  CREATININE 1.40*  CALCIUM 8.9   Liver Function Tests: Recent Labs  Lab 04/13/21 0101  AST 43*  ALT 26  ALKPHOS 97  BILITOT 0.8  PROT 7.2  ALBUMIN 3.9   No results for input(s): LIPASE, AMYLASE in the last 168 hours. Recent Labs  Lab 04/13/21 0224  AMMONIA 45*   CBC: Recent Labs  Lab 04/13/21 0101  WBC 23.9*  NEUTROABS 19.2*  HGB 9.7*  HCT 30.3*  MCV 70.3*  PLT 459*   Cardiac Enzymes: No results for input(s): CKTOTAL, CKMB, CKMBINDEX, TROPONINI in the last 168 hours. BNP: Invalid input(s): POCBNP CBG: Recent Labs  Lab 04/13/21 0054 04/13/21 0139 04/13/21 0314  GLUCAP 42* 140* 122*   D-Dimer Recent Labs    04/13/21 0542  DDIMER 0.72*   Hgb A1c No results for input(s): HGBA1C in the last 72 hours. Lipid Profile No results for input(s): CHOL, HDL, LDLCALC, TRIG, CHOLHDL, LDLDIRECT in the last 72 hours. Thyroid function studies No results for input(s): TSH, T4TOTAL, T3FREE, THYROIDAB in the last 72 hours.  Invalid input(s): FREET3 Anemia work up No results for input(s):  VITAMINB12, FOLATE, FERRITIN, TIBC, IRON, RETICCTPCT in the last 72 hours. Urinalysis    Component Value Date/Time   COLORURINE YELLOW 04/13/2021 0400   APPEARANCEUR HAZY (A) 04/13/2021 0400   LABSPEC 1.010 04/13/2021 0400   PHURINE 5.0 04/13/2021 0400   GLUCOSEU NEGATIVE 04/13/2021 0400   HGBUR SMALL (A) 04/13/2021 0400   BILIRUBINUR NEGATIVE 04/13/2021 0400   KETONESUR NEGATIVE 04/13/2021 0400   PROTEINUR NEGATIVE 04/13/2021 0400   UROBILINOGEN 1.0 02/09/2015 1228   NITRITE NEGATIVE 04/13/2021 0400   LEUKOCYTESUR NEGATIVE 04/13/2021 0400   Sepsis Labs Invalid input(s): PROCALCITONIN,  WBC,  LACTICIDVEN Microbiology Recent Results (from the past 240 hour(s))  Culture, blood (single)     Status:  None (Preliminary result)   Collection Time: 04/13/21  2:24 AM   Specimen: BLOOD RIGHT FOREARM  Result Value Ref Range Status   Specimen Description   Final    BLOOD RIGHT FOREARM Performed at [facility] [address], [place], [place] [address]    Special Requests   Final    BOTTLES DRAWN AEROBIC AND ANAEROBIC Blood Culture adequate volume Performed at [facility] [address], [place], [place] [address]    Culture PENDING  Incomplete   Report Status PENDING  Incomplete  Resp Panel by RT-PCR (Flu A&B, Covid) Nasopharyngeal Swab     Status: None   Collection Time: 04/13/21  5:10 AM   Specimen: Nasopharyngeal Swab; Nasopharyngeal(NP) swabs in vial transport medium  Result Value Ref Range Status   SARS Coronavirus 2 by RT PCR NEGATIVE NEGATIVE Final    Comment: (NOTE) SARS-CoV-2 target nucleic acids are NOT DETECTED.  The SARS-CoV-2 RNA is generally detectable in upper respiratory specimens during the acute phase of infection. The lowest concentration of SARS-CoV-2 viral copies this assay can detect is 138 copies/mL. A negative result does not preclude SARS-Cov-2 infection and should not be used as the sole basis for treatment or other patient management decisions. A negative result may occur with  improper specimen collection/handling, submission of specimen other than nasopharyngeal swab, presence of viral mutation(s) within the areas targeted by this assay, and inadequate number of viral copies(<138 copies/mL). A negative result must be combined with clinical observations, patient history, and epidemiological information. The expected result is Negative.  Fact Sheet for Patients:  EntrepreneurPulse.com.au  Fact Sheet for Healthcare Providers:  IncredibleEmployment.be  This test is no t yet approved or cleared by the [place] FDA and  has been authorized for detection and/or diagnosis of SARS-CoV-2  by FDA under an Emergency Use Authorization (EUA). This EUA will remain  in effect (meaning this test can be used) for the duration of the COVID-19 declaration under Section 564(b)(1) of the Act, 21 U.S.C.section 360bbb-3(b)(1), unless the authorization is terminated  or revoked sooner.       Influenza A by PCR NEGATIVE NEGATIVE Final   Influenza B by PCR NEGATIVE NEGATIVE Final    Comment: (NOTE) The Xpert Xpress SARS-CoV-2/FLU/RSV plus assay is intended as an aid in the diagnosis of influenza from Nasopharyngeal swab specimens and should not be used as a sole basis for treatment. Nasal washings and aspirates are unacceptable for Xpert Xpress SARS-CoV-2/FLU/RSV testing.  Fact Sheet for Patients: EntrepreneurPulse.com.au  Fact Sheet for Healthcare Providers: IncredibleEmployment.be  This test is not yet approved or cleared by the [place] FDA and has been authorized for detection and/or diagnosis of SARS-CoV-2 by FDA under an Emergency Use Authorization (EUA). This EUA will remain in effect (meaning this  test can be used) for the duration of the COVID-19 declaration under Section 564(b)(1) of the Act, 21 U.S.C. section 360bbb-3(b)(1), unless the authorization is terminated or revoked.  Performed at [facility], [place] [address], [place], [place] [address]    Time spent: 30 min  SIGNED:   Marylu Lund, MD  [facility] 04/13/2021, 1:22 PM  If 7PM-7AM, please contact night-coverage

## 2021-04-18 ENCOUNTER — Emergency Department (HOSPITAL_COMMUNITY): Payer: Medicaid Other

## 2021-04-18 ENCOUNTER — Emergency Department (HOSPITAL_COMMUNITY)
Admission: EM | Admit: 2021-04-18 | Discharge: 2021-04-18 | Disposition: A | Discharge status: Home / Self Care | Payer: Medicaid Other | Attending: Emergency Medicine | Admitting: Emergency Medicine

## 2021-04-18 ENCOUNTER — Encounter (HOSPITAL_COMMUNITY): Payer: Self-pay | Admitting: Emergency Medicine

## 2021-04-18 DIAGNOSIS — K2971 Gastritis, unspecified, with bleeding: Secondary | ICD-10-CM | POA: Insufficient documentation

## 2021-04-18 DIAGNOSIS — F17210 Nicotine dependence, cigarettes, uncomplicated: Secondary | ICD-10-CM | POA: Insufficient documentation

## 2021-04-18 DIAGNOSIS — K2970 Gastritis, unspecified, without bleeding: Secondary | ICD-10-CM

## 2021-04-18 DIAGNOSIS — I10 Essential (primary) hypertension: Secondary | ICD-10-CM | POA: Insufficient documentation

## 2021-04-18 DIAGNOSIS — Z20822 Contact with and (suspected) exposure to covid-19: Secondary | ICD-10-CM | POA: Diagnosis not present

## 2021-04-18 DIAGNOSIS — F1910 Other psychoactive substance abuse, uncomplicated: Secondary | ICD-10-CM

## 2021-04-18 DIAGNOSIS — R112 Nausea with vomiting, unspecified: Secondary | ICD-10-CM

## 2021-04-18 DIAGNOSIS — N9489 Other specified conditions associated with female genital organs and menstrual cycle: Secondary | ICD-10-CM | POA: Insufficient documentation

## 2021-04-18 DIAGNOSIS — R1013 Epigastric pain: Secondary | ICD-10-CM | POA: Diagnosis present

## 2021-04-18 DIAGNOSIS — Z716 Tobacco abuse counseling: Secondary | ICD-10-CM

## 2021-04-18 DIAGNOSIS — J45909 Unspecified asthma, uncomplicated: Secondary | ICD-10-CM | POA: Diagnosis not present

## 2021-04-18 DIAGNOSIS — Z79899 Other long term (current) drug therapy: Secondary | ICD-10-CM | POA: Diagnosis not present

## 2021-04-18 LAB — CBC WITH DIFFERENTIAL/PLATELET
Abs Immature Granulocytes: 0.02 10*3/uL (ref 0.00–0.07)
Basophils Absolute: 0.1 10*3/uL (ref 0.0–0.1)
Basophils Relative: 1 %
Eosinophils Absolute: 0.1 10*3/uL (ref 0.0–0.5)
Eosinophils Relative: 1 %
HCT: 29.9 % — ABNORMAL LOW (ref 36.0–46.0)
Hemoglobin: 9.7 g/dL — ABNORMAL LOW (ref 12.0–15.0)
Immature Granulocytes: 0 %
Lymphocytes Relative: 27 %
Lymphs Abs: 2.1 10*3/uL (ref 0.7–4.0)
MCH: 23.4 pg — ABNORMAL LOW (ref 26.0–34.0)
MCHC: 32.4 g/dL (ref 30.0–36.0)
MCV: 72 fL — ABNORMAL LOW (ref 80.0–100.0)
Monocytes Absolute: 0.5 10*3/uL (ref 0.1–1.0)
Monocytes Relative: 6 %
Neutro Abs: 5.2 10*3/uL (ref 1.7–7.7)
Neutrophils Relative %: 65 %
Platelets: 343 10*3/uL (ref 150–400)
RBC: 4.15 MIL/uL (ref 3.87–5.11)
RDW: 24.7 % — ABNORMAL HIGH (ref 11.5–15.5)
WBC: 8 10*3/uL (ref 4.0–10.5)
nRBC: 0 % (ref 0.0–0.2)

## 2021-04-18 LAB — PROTIME-INR
INR: 1 (ref 0.8–1.2)
Prothrombin Time: 13.1 seconds (ref 11.4–15.2)

## 2021-04-18 LAB — COMPREHENSIVE METABOLIC PANEL
ALT: 14 U/L (ref 0–44)
AST: 16 U/L (ref 15–41)
Albumin: 3.5 g/dL (ref 3.5–5.0)
Alkaline Phosphatase: 88 U/L (ref 38–126)
Anion gap: 8 (ref 5–15)
BUN: 18 mg/dL (ref 6–20)
CO2: 29 mmol/L (ref 22–32)
Calcium: 8.7 mg/dL — ABNORMAL LOW (ref 8.9–10.3)
Chloride: 97 mmol/L — ABNORMAL LOW (ref 98–111)
Creatinine, Ser: 1.08 mg/dL — ABNORMAL HIGH (ref 0.44–1.00)
GFR, Estimated: >60 mL/min (ref >60)
Glucose, Bld: 101 mg/dL — ABNORMAL HIGH (ref 70–99)
Potassium: 3.3 mmol/L — ABNORMAL LOW (ref 3.5–5.1)
Sodium: 134 mmol/L — ABNORMAL LOW (ref 135–145)
Total Bilirubin: 0.8 mg/dL (ref 0.3–1.2)
Total Protein: 6.5 g/dL (ref 6.5–8.1)

## 2021-04-18 LAB — LIPASE, BLOOD: Lipase: 26 U/L (ref 11–51)

## 2021-04-18 LAB — RESP PANEL BY RT-PCR (FLU A&B, COVID) ARPGX2
Influenza A by PCR: NEGATIVE
Influenza B by PCR: NEGATIVE
SARS Coronavirus 2 by RT PCR: NEGATIVE

## 2021-04-18 LAB — HCG, SERUM, QUALITATIVE: Preg, Serum: NEGATIVE

## 2021-04-18 LAB — CULTURE, BLOOD (SINGLE)
Culture: NO GROWTH
Special Requests: ADEQUATE

## 2021-04-18 MED ORDER — POTASSIUM CHLORIDE CRYS ER 20 MEQ PO TBCR
40.0000 meq | EXTENDED_RELEASE_TABLET | Freq: Once | ORAL | Status: DC
  Filled 2021-04-18: qty 2

## 2021-04-18 MED ORDER — AMLODIPINE BESYLATE 5 MG PO TABS
5.0000 mg | ORAL_TABLET | Freq: Once | ORAL | Status: AC
  Administered 2021-04-18: 5 mg via ORAL
  Filled 2021-04-18: qty 1

## 2021-04-18 MED ORDER — PANTOPRAZOLE SODIUM 40 MG IV SOLR
40.0000 mg | Freq: Once | INTRAVENOUS | Status: AC
  Administered 2021-04-18: 40 mg via INTRAVENOUS
  Filled 2021-04-18: qty 40

## 2021-04-18 MED ORDER — ONDANSETRON HCL 4 MG/2ML IJ SOLN
4.0000 mg | Freq: Once | INTRAMUSCULAR | Status: AC
  Administered 2021-04-18: 4 mg via INTRAVENOUS
  Filled 2021-04-18: qty 2

## 2021-04-18 MED ORDER — IOHEXOL 350 MG/ML SOLN
80.0000 mL | Freq: Once | INTRAVENOUS | Status: AC | PRN
  Administered 2021-04-18: 80 mL via INTRAVENOUS

## 2021-04-18 MED ORDER — SODIUM CHLORIDE 0.9 % IV BOLUS
1000.0000 mL | Freq: Once | INTRAVENOUS | Status: AC
  Administered 2021-04-18: 1000 mL via INTRAVENOUS

## 2021-04-18 MED ORDER — ALUM & MAG HYDROXIDE-SIMETH 200-200-20 MG/5ML PO SUSP
15.0000 mL | Freq: Once | ORAL | Status: AC
  Administered 2021-04-18: 15 mL via ORAL
  Filled 2021-04-18: qty 30

## 2021-04-18 NOTE — ED Provider Notes (Signed)
[facility] DEPT Provider Note   CSN: [id number] Arrival date & time: 04/18/21  9702     History Chief Complaint  Patient presents with   Hematemesis    Terri [name] is a 45 y.o. female.  This is a 45 y.o. female iwth significant medical history as below, including polysubstance abuse with recent cocaine use approximately 2 days ago, daily tobacco use, history of gastric ulcer who presents to the ED with complaint of midepigastric to left upper quadrant abdominal pain for the past 3 to 4 days described as burning, aching.  Nausea with vomiting last night with some streaks of blood that was described as dark red.  She also been having a productive cough with greenish, brownish colored sputum.  No fevers or chills, she has had reduced oral intake over the past couple days secondary to abdominal pain and nausea.  The pain is worsened shortly after oral intake.  No change in bowel or bladder function, no melena or bright red blood per rectum.  No abnormal vaginal bleeding or discharge.  No rashes.  No recent travel or sick contacts.    She is a daily tobacco smoker.  No excessive NSAID or alcohol use recently.  She has been compliant with her antacid medication but does not feel as though it is helping her symptoms  The history is provided by the patient. No language interpreter was used.      Past Medical History:  Diagnosis Date   Arthritis    Back and legs   Bipolar disorder (HCC)    Chronic back pain    Depression    DVT (deep venous thrombosis) (HCC)    early 20's leg   GIB (gastrointestinal bleeding) 09/13/2017   History of blood transfusion    Hypertension    Migraine headache    Neuropathy    Pneumonia    Positive PPD    x 2 last time 09/2017   Sciatica    Stress incontinence     Patient Active Problem List   Diagnosis Date Noted   Overdose 04/13/2021   Hypoxemia 04/13/2021   Hypoglycemia 04/13/2021   Asthma exacerbation 04/01/2021    Anemia 04/01/2021   Iron deficiency anemia due to chronic blood loss 03/18/2021   Hyponatremia 03/18/2021   Marginal ulcer 03/15/2021   Microcytic anemia 02/26/2021   Mild protein malnutrition ([place]) 02/26/2021   Polysubstance abuse ([place]) 01/16/2021   Abdominal pain 01/14/2021   Ulcer at site of surgical anastomosis following bypass of stomach 01/13/2021   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   Persistent fever 02/09/2019   COVID-19 virus not detected    Chest pain    Bacteremia due to Escherichia coli    Esophagitis    Hypokalemia    Hypomagnesemia    Sepsis ([place]) 02/02/2019   Leucocytosis 02/02/2019   Thrombocytosis 02/02/2019   UTI (urinary tract infection) 02/02/2019   Apnea 09/13/2017   Essential hypertension    Cellulitis and abscess of neck 08/12/2017   Cellulitis of submandibular region 08/10/2017   Odontogenic infection of jaw 08/10/2017   Cholecystitis 02/14/2017  Opioid abuse with opioid-induced mood disorder ([place]) 01/25/2017   Anxiety 02/11/2014   Depression 07/12/2012    Past Surgical History:  Procedure Laterality Date   ALVEOLOPLASTY Bilateral 01/31/2018   Procedure: ALVEOLOPLASTY;  Surgeon: Diona Browner, DDS;  Location: [place] [facility];  Service: Oral Surgery;  Laterality: Bilateral;   ANKLE SURGERY Left 1992   with hardware   BACK SURGERY     BIOPSY  02/08/2019   Procedure: BIOPSY;  Surgeon: Rush Landmark Telford Nab., MD;  Location: [facility];  Service: Gastroenterology;;   CESAREAN SECTION     CHOLECYSTECTOMY N/A 02/15/2017   Procedure: LAPAROSCOPIC CHOLECYSTECTOMY;  Surgeon: Clovis Riley, MD;  Location: [facility];  Service: General;  Laterality: N/A;   ENTEROSCOPY N/A 02/08/2019   Procedure: ENTEROSCOPY;  Surgeon: Rush Landmark Telford Nab., MD;  Location: [facility];  Service: Gastroenterology;  Laterality: N/A;   ESOPHAGOGASTRODUODENOSCOPY N/A 01/17/2021   Procedure: ESOPHAGOGASTRODUODENOSCOPY (EGD);  Surgeon: Juanita Craver, MD;  Location: [facility];  Service: Endoscopy;  Laterality: N/A;   ESOPHAGOGASTRODUODENOSCOPY (EGD) WITH PROPOFOL N/A 09/15/2017   Procedure: ESOPHAGOGASTRODUODENOSCOPY (EGD) WITH PROPOFOL;  Surgeon: Clarene Essex, MD;  Location: [facility];  Service: Endoscopy;  Laterality: N/A;   ESOPHAGOGASTRODUODENOSCOPY (EGD) WITH PROPOFOL N/A 02/08/2019   Procedure: ESOPHAGOGASTRODUODENOSCOPY (EGD) WITH PROPOFOL;  Surgeon: Rush Landmark Telford Nab., MD;  Location: [facility];  Service: Gastroenterology;  Laterality: N/A;   GASTRIC BYPASS     INCISION AND DRAINAGE OF PERITONSILLAR ABCESS Left 08/13/2017   Procedure: INCISION AND DRAINAGE OF LEFT NECK ABSCESS;  Surgeon: Melida Quitter, MD;  Location: [facility];  Service: ENT;  Laterality: Left;   LAPAROSCOPIC LYSIS OF ADHESIONS  02/15/2017   Procedure: LAPAROSCOPIC LYSIS OF ADHESIONS;  Surgeon: Clovis Riley, MD;  Location: [facility];  Service: General;;   LUMBAR DISC SURGERY     LUMBAR FUSION     SAVORY DILATION N/A 02/08/2019   Procedure: SAVORY DILATION;  Surgeon: Irving Copas., MD;  Location: [facility];  Service: Gastroenterology;  Laterality: N/A;   TOOTH EXTRACTION Bilateral 01/31/2018   Procedure: DENTAL RESTORATION/EXTRACTIONS;  Surgeon: Diona Browner, DDS;  Location: [facility];  Service: Oral Surgery;  Laterality: Bilateral;   TUBAL LIGATION     UPPER GI ENDOSCOPY N/A 02/15/2017   Procedure: UPPER GI ENDOSCOPY;  Surgeon: Clovis Riley, MD;  Location: [facility];  Service: General;  Laterality: N/A;     OB History   No obstetric history on file.     Family History  Problem Relation Age of Onset    Diabetes Mother    Hypertension Mother    Diabetes Father    Hypertension Father     Social History   Tobacco Use   Smoking status: Every Day    Packs/day: 1.00    Years: 29.00    Pack years: 29.00    Types: Cigarettes   Smokeless tobacco: Never  Vaping Use   Vaping Use: Never used  Substance Use Topics   Alcohol use: No   Drug use: Yes    Types: IV, Cocaine    Home Medications Prior to Admission medications   Medication Sig Start Date End Date Taking? Authorizing Provider  acetaminophen (TYLENOL) 500 MG tablet Take 1,000 mg by mouth every 6 (six) hours as needed for mild pain.   Yes [provider]  Pseudoeph-Doxylamine-DM-APAP (NYQUIL PO) Take 2 capsules by mouth at bedtime as needed (cold symptoms).   Yes [provider]  amLODipine (NORVASC) 10 MG tablet Take 1 tablet (10 mg total) by  mouth daily. Patient not taking: Reported on 04/18/2021 04/06/21   Domenic Polite, MD  Ferrous Fumarate (IRON) 18 MG TBCR Take 1 tablet (18 mg total) by mouth 2 (two) times daily after a meal. Patient not taking: Reported on 04/18/2021 04/06/21   Domenic Polite, MD  hydrALAZINE (APRESOLINE) 50 MG tablet Take 1 tablet (50 mg total) by mouth 2 (two) times daily. Patient not taking: Reported on 04/18/2021 04/06/21   Domenic Polite, MD  hydrOXYzine (ATARAX/VISTARIL) 25 MG tablet Take 1 tablet (25 mg total) by mouth every 8 (eight) hours as needed for anxiety. Patient not taking: Reported on 04/18/2021 04/06/21   Domenic Polite, MD  lisinopril (ZESTRIL) 10 MG tablet Take 1 tablet (10 mg total) by mouth daily. Patient not taking: Reported on 04/18/2021 04/06/21   Domenic Polite, MD  methocarbamol (ROBAXIN) 750 MG tablet Take 1 tablet (750 mg total) by mouth every 8 (eight) hours as needed for muscle spasms. Patient not taking: Reported on 04/18/2021 04/06/21   Domenic Polite, MD  nicotine (NICODERM CQ - DOSED IN MG/24 HOURS) 21 mg/24hr patch Place 1 patch (21 mg total)  onto the skin daily. Patient not taking: Reported on 04/18/2021 04/07/21   Domenic Polite, MD  pantoprazole (PROTONIX) 40 MG tablet Take 1 tablet (40 mg total) by mouth daily. Patient not taking: Reported on 04/01/2021 03/18/21   Edwin Dada, MD  traMADol (ULTRAM) 50 MG tablet Take 1 tablet (50 mg total) by mouth every 6 (six) hours as needed for moderate pain. Patient not taking: Reported on 04/18/2021 04/06/21   Domenic Polite, MD  vitamin B-12 (CYANOCOBALAMIN) 1000 MCG tablet Take 1 tablet (1,000 mcg total) by mouth daily. Patient not taking: Reported on 04/18/2021 04/06/21   Domenic Polite, MD    Allergies    Nsaids, Ketorolac tromethamine, and Methocarbamol  Review of Systems   Review of Systems  Constitutional:  Negative for chills and fever.  HENT:  Negative for facial swelling and trouble swallowing.   Eyes:  Negative for photophobia and visual disturbance.  Respiratory:  Positive for cough. Negative for shortness of breath.   Cardiovascular:  Negative for chest pain and palpitations.  Gastrointestinal:  Positive for abdominal pain, nausea and vomiting.  Endocrine: Negative for polydipsia and polyuria.  Genitourinary:  Negative for difficulty urinating and hematuria.  Musculoskeletal:  Negative for gait problem and joint swelling.  Skin:  Negative for pallor and rash.  Neurological:  Negative for syncope and headaches.  Psychiatric/Behavioral:  Negative for agitation and confusion.    Physical Exam Updated Vital Signs BP (!) 165/110   Pulse 77   Temp 97.8 F (36.6 C) (Oral)   Resp (!) 28   Ht 5\' 7"  (1.702 m)   Wt 45.4 kg   SpO2 100%   BMI 15.66 kg/m   Physical Exam Vitals and nursing note reviewed.  Constitutional:      General: She is not in acute distress.    Appearance: Normal appearance.  HENT:     Head: Normocephalic and atraumatic.     Right Ear: External ear normal.     Left Ear: External ear normal.     Nose: Nose normal.      Mouth/Throat:     Mouth: Mucous membranes are moist.  Eyes:     General: No scleral icterus.       Right eye: No discharge.        Left eye: No discharge.  Cardiovascular:     Rate and Rhythm: Normal rate  and regular rhythm.     Pulses: Normal pulses.     Heart sounds: Normal heart sounds.  Pulmonary:     Effort: Pulmonary effort is normal. No respiratory distress.     Breath sounds: Normal breath sounds.  Abdominal:     General: Abdomen is flat. There is no distension.     Tenderness: There is abdominal tenderness.    Musculoskeletal:        General: Normal range of motion.     Cervical back: Normal range of motion.     Right lower leg: No edema.     Left lower leg: No edema.  Skin:    General: Skin is warm and dry.     Capillary Refill: Capillary refill takes less than 2 seconds.  Neurological:     Mental Status: She is alert.  Psychiatric:        Mood and Affect: Mood normal.        Behavior: Behavior normal.    ED Results / Procedures / Treatments   Labs (all labs ordered are listed, but only abnormal results are displayed) Labs Reviewed  COMPREHENSIVE METABOLIC PANEL - Abnormal; Notable for the following components:      Result Value   Sodium 134 (*)    Potassium 3.3 (*)    Chloride 97 (*)    Glucose, Bld 101 (*)    Creatinine, Ser 1.08 (*)    Calcium 8.7 (*)    All other components within normal limits  CBC WITH DIFFERENTIAL/PLATELET - Abnormal; Notable for the following components:   Hemoglobin 9.7 (*)    HCT 29.9 (*)    MCV 72.0 (*)    MCH 23.4 (*)    RDW 24.7 (*)    All other components within normal limits  RESP PANEL BY RT-PCR (FLU A&B, COVID) ARPGX2  PROTIME-INR  LIPASE, BLOOD  HCG, SERUM, QUALITATIVE    EKG None  Radiology DG Chest 2 View  Result Date: 04/18/2021 CLINICAL DATA:  45 year old female with history of productive cough and hematemesis. EXAM: CHEST - 2 VIEW COMPARISON:  Chest x-ray 04/13/2021. FINDINGS: Lung volumes are  increased with emphysematous changes. No consolidative airspace disease. No pleural effusions. No pneumothorax. No pulmonary nodule or mass noted. Pulmonary vasculature and the cardiomediastinal silhouette are within normal limits. IMPRESSION: 1. No radiographic evidence of acute cardiopulmonary disease. 2. Emphysema. Electronically Signed   By: Vinnie Langton M.D.   On: 04/18/2021 08:36   CT ABDOMEN PELVIS W CONTRAST  Result Date: 04/18/2021 CLINICAL DATA:  Diffuse abdominal pain with nausea vomiting. EXAM: CT ABDOMEN AND PELVIS WITH CONTRAST TECHNIQUE: Multidetector CT imaging of the abdomen and pelvis was performed using the standard protocol following bolus administration of intravenous contrast. CONTRAST:  42mL OMNIPAQUE IOHEXOL 350 MG/ML SOLN COMPARISON:  03/15/2021 FINDINGS: Lower chest: No acute abnormality. Hepatobiliary: Liver is normal in size and overall attenuation. 10 mm low-density lesion in the peripheral aspect of the right lobe consistent with a cyst and unchanged. No other liver masses or lesions. Status post cholecystectomy. No bile duct dilation. Pancreas: Unremarkable. No pancreatic ductal dilatation or surrounding inflammatory changes. Spleen: Top normal in size, 13 cm in longest dimension. No splenic mass or focal lesion and no change. Adrenals/Urinary Tract: No adrenal masses. Mild, left greater than right, bilateral renal cortical thinning. No renal masses or stones. Symmetric renal enhancement and excretion. Ureters are normal in course and in caliber. Bladder is unremarkable. Stomach/Bowel: Status post gastric bypass surgery no bowel dilation to suggest  obstruction. No convincing wall thickening or adjacent inflammation. Diffuse increased attenuation throughout the mesentery consistent with edema, similar to the prior study. Vascular/Lymphatic: Dense aortic atherosclerosis. No aneurysm. No enlarged lymph nodes. Reproductive: Uterus not well-defined.  No adnexal masses. Other: No  ascites.  Diffuse subcutaneous edema. Musculoskeletal: No fracture or acute finding. No osteoblastic or osteolytic lesions. IMPRESSION: 1. No definite acute abnormality within the abdomen or pelvis. Study is limited by the patient's limited peritoneal fat and generalized increased attenuation within the peritoneal fat limiting soft tissue contrast. Allowing for this, there is no evidence bowel obstruction or bowel inflammation. There are stable changes from previous gastric bypass surgery. 2. Prominent aortic atherosclerosis, also unchanged. Electronically Signed   By: Lajean Manes M.D.   On: 04/18/2021 10:29    Procedures Procedures   Medications Ordered in ED Medications  pantoprazole (PROTONIX) injection 40 mg (40 mg Intravenous Given 04/18/21 0755)  ondansetron (ZOFRAN) injection 4 mg (4 mg Intravenous Given 04/18/21 0755)  alum & mag hydroxide-simeth (MAALOX/MYLANTA) 200-200-20 MG/5ML suspension 15 mL (15 mLs Oral Given 04/18/21 0755)  sodium chloride 0.9 % bolus 1,000 mL (0 mLs Intravenous Stopped 04/18/21 1012)  iohexol (OMNIPAQUE) 350 MG/ML injection 80 mL (80 mLs Intravenous Contrast Given 04/18/21 0924)  amLODipine (NORVASC) tablet 5 mg (5 mg Oral Given 04/18/21 0935)    ED Course  I have reviewed the triage vital signs and the nursing notes.  Pertinent labs & imaging results that were available during my care of the patient were reviewed by me and considered in my medical decision making (see chart for details).    MDM Rules/Calculators/A&P                           CC: abd pain , emesis   This patient complains of above; this involves an extensive number of treatment options and is a complaint that carries with it a high risk of complications and morbidity. Vital signs were reviewed. Serious etiologies considered.  Record review:  Previous records obtained and reviewed   Work up as above, notable for:  Labs & imaging results that were available during my care of the  patient were reviewed by me and considered in my medical decision making.   Patient with mild hypokalemia, she did refuse oral potassium replacement.  I ordered imaging studies which included CXR, CTAP and I independently visualized and interpreted imaging which showed no acute processes although limited  Management: Given PPI, IVF, anti-emetic   Reassessment:  Patient does report she is feeling better, she is anxious and is ready to leave.  This was relayed to me by nursing.  I went to go assess the patient and she has left the room.  Per nursing patient has eloped.  Patient did not receive discharge instructions or follow-up precautions.  CT imaging was not resulted at time of elopement. She was observed by front staff walking with a steady gait with her associate who arrived to the ED shortly after her elopement.   I do favor gastroenteritis or gastric ulcer as etiology of complaints today. She would likely benefit from adding a PPI to her home medication regimen and a bland diet, tobacco cessation, illicit drug cessation.        Counseled patient for approximately 3 minutes regarding smoking cessation. Discussed risks of smoking and how they applied and affected their visit here today. Patient not ready to quit at this time, however will follow up with their  primary doctor when they are.   CPT code: [phone]: intermediate counseling for smoking cessation      This chart was dictated using voice recognition software.  Despite best efforts to proofread,  errors can occur which can change the documentation meaning.  Final Clinical Impression(s) / ED Diagnoses Final diagnoses:  Encounter for tobacco use cessation counseling  Nausea and vomiting, unspecified vomiting type  Polysubstance abuse ([place])  Gastritis, presence of bleeding unspecified, unspecified chronicity, unspecified gastritis type    Rx / DC Orders ED Discharge Orders     None        Jeanell Sparrow, DO 04/18/21  1547

## 2021-04-18 NOTE — ED Triage Notes (Signed)
Pt reports hematemesis and abdominal pain that started last night and worsened over night. She states that she was here last week and told that her blood was acidic.

## 2021-04-21 ENCOUNTER — Other Ambulatory Visit: Payer: Self-pay

## 2021-04-21 ENCOUNTER — Other Ambulatory Visit (HOSPITAL_COMMUNITY): Payer: Self-pay

## 2021-04-21 ENCOUNTER — Emergency Department (HOSPITAL_COMMUNITY): Payer: Medicaid Other

## 2021-04-21 ENCOUNTER — Encounter (HOSPITAL_COMMUNITY): Payer: Self-pay

## 2021-04-21 ENCOUNTER — Emergency Department (HOSPITAL_COMMUNITY)
Admission: EM | Admit: 2021-04-21 | Discharge: 2021-04-21 | Disposition: A | Discharge status: Home / Self Care | Payer: Medicaid Other | Attending: Emergency Medicine | Admitting: Emergency Medicine

## 2021-04-21 DIAGNOSIS — F17210 Nicotine dependence, cigarettes, uncomplicated: Secondary | ICD-10-CM | POA: Insufficient documentation

## 2021-04-21 DIAGNOSIS — J45909 Unspecified asthma, uncomplicated: Secondary | ICD-10-CM | POA: Diagnosis not present

## 2021-04-21 DIAGNOSIS — Z79899 Other long term (current) drug therapy: Secondary | ICD-10-CM | POA: Insufficient documentation

## 2021-04-21 DIAGNOSIS — I10 Essential (primary) hypertension: Secondary | ICD-10-CM | POA: Diagnosis not present

## 2021-04-21 DIAGNOSIS — R1013 Epigastric pain: Secondary | ICD-10-CM | POA: Diagnosis present

## 2021-04-21 LAB — COMPREHENSIVE METABOLIC PANEL
ALT: 13 U/L (ref 0–44)
AST: 13 U/L — ABNORMAL LOW (ref 15–41)
Albumin: 3.6 g/dL (ref 3.5–5.0)
Alkaline Phosphatase: 92 U/L (ref 38–126)
Anion gap: 9 (ref 5–15)
BUN: 18 mg/dL (ref 6–20)
CO2: 27 mmol/L (ref 22–32)
Calcium: 8.9 mg/dL (ref 8.9–10.3)
Chloride: 98 mmol/L (ref 98–111)
Creatinine, Ser: 0.8 mg/dL (ref 0.44–1.00)
GFR, Estimated: >60 mL/min (ref >60)
Glucose, Bld: 95 mg/dL (ref 70–99)
Potassium: 3.7 mmol/L (ref 3.5–5.1)
Sodium: 134 mmol/L — ABNORMAL LOW (ref 135–145)
Total Bilirubin: 0.6 mg/dL (ref 0.3–1.2)
Total Protein: 6.8 g/dL (ref 6.5–8.1)

## 2021-04-21 LAB — CBC WITH DIFFERENTIAL/PLATELET
Abs Immature Granulocytes: 0.02 10*3/uL (ref 0.00–0.07)
Basophils Absolute: 0 10*3/uL (ref 0.0–0.1)
Basophils Relative: 0 %
Eosinophils Absolute: 0 10*3/uL (ref 0.0–0.5)
Eosinophils Relative: 0 %
HCT: 36 % (ref 36.0–46.0)
Hemoglobin: 11.7 g/dL — ABNORMAL LOW (ref 12.0–15.0)
Immature Granulocytes: 0 %
Lymphocytes Relative: 27 %
Lymphs Abs: 2.5 10*3/uL (ref 0.7–4.0)
MCH: 23.6 pg — ABNORMAL LOW (ref 26.0–34.0)
MCHC: 32.5 g/dL (ref 30.0–36.0)
MCV: 72.6 fL — ABNORMAL LOW (ref 80.0–100.0)
Monocytes Absolute: 0.5 10*3/uL (ref 0.1–1.0)
Monocytes Relative: 5 %
Neutro Abs: 6.3 10*3/uL (ref 1.7–7.7)
Neutrophils Relative %: 68 %
Platelets: 303 10*3/uL (ref 150–400)
RBC: 4.96 MIL/uL (ref 3.87–5.11)
RDW: 25.1 % — ABNORMAL HIGH (ref 11.5–15.5)
WBC: 9.2 10*3/uL (ref 4.0–10.5)
nRBC: 0 % (ref 0.0–0.2)

## 2021-04-21 LAB — LIPASE, BLOOD: Lipase: 27 U/L (ref 11–51)

## 2021-04-21 MED ORDER — HYDROMORPHONE HCL 1 MG/ML IJ SOLN
0.5000 mg | Freq: Once | INTRAMUSCULAR | Status: AC
  Administered 2021-04-21: 0.5 mg via INTRAVENOUS

## 2021-04-21 MED ORDER — AMLODIPINE BESYLATE 10 MG PO TABS: 10.0000 mg | ORAL_TABLET | Freq: Every day | ORAL | 0 refills | Status: DC

## 2021-04-21 MED ORDER — SODIUM CHLORIDE 0.9 % IV BOLUS
500.0000 mL | Freq: Once | INTRAVENOUS | Status: AC
  Administered 2021-04-21: 500 mL via INTRAVENOUS

## 2021-04-21 MED ORDER — ONDANSETRON HCL 4 MG/2ML IJ SOLN
4.0000 mg | Freq: Once | INTRAMUSCULAR | Status: AC
  Administered 2021-04-21: 4 mg via INTRAVENOUS

## 2021-04-21 MED ORDER — ALUM & MAG HYDROXIDE-SIMETH 200-200-20 MG/5ML PO SUSP
30.0000 mL | Freq: Once | ORAL | Status: AC
  Administered 2021-04-21: 30 mL via ORAL

## 2021-04-21 MED ORDER — LISINOPRIL 10 MG PO TABS: 10.0000 mg | ORAL_TABLET | Freq: Every day | ORAL | 0 refills | Status: DC

## 2021-04-21 MED ORDER — HYDRALAZINE HCL 20 MG/ML IJ SOLN
10.0000 mg | Freq: Once | INTRAMUSCULAR | Status: AC
  Administered 2021-04-21: 10 mg via INTRAVENOUS

## 2021-04-21 MED ORDER — PANTOPRAZOLE SODIUM 40 MG IV SOLR
40.0000 mg | Freq: Once | INTRAVENOUS | Status: AC
  Administered 2021-04-21: 40 mg via INTRAVENOUS

## 2021-04-21 MED ORDER — LIDOCAINE VISCOUS HCL 2 % MT SOLN
15.0000 mL | Freq: Once | OROMUCOSAL | Status: DC
  Filled 2021-04-21 (×2): qty 15

## 2021-04-21 MED ORDER — AMLODIPINE BESYLATE 5 MG PO TABS
10.0000 mg | ORAL_TABLET | Freq: Once | ORAL | Status: AC
  Administered 2021-04-21: 10 mg via ORAL

## 2021-04-21 MED ORDER — FAMOTIDINE IN NACL 20-0.9 MG/50ML-% IV SOLN: 20.0000 mg | Freq: Once | INTRAVENOUS | Status: DC

## 2021-04-21 MED ORDER — ONDANSETRON 4 MG PO TBDP: 4.0000 mg | ORAL_TABLET | Freq: Three times a day (TID) | ORAL | 0 refills | Status: DC | PRN

## 2021-04-21 NOTE — ED Provider Notes (Signed)
[facility] DEPT Provider Note   CSN: [id number] Arrival date & time: 04/21/21  0400     History Chief Complaint  Patient presents with   Abdominal Pain   Hematemesis    Terri [name] is a 45 y.o. female.  HPI Patient is a 45 year old female presented to the emergency room today with complaints of hematemesis 1 episode today.  States that she has a history of a stomach ulcer and states that she has been taking Protonix which she is prescribed.  States that she has not taking any of her other medications including her antihypertensives.  She states that she has had a few episodes of vomiting over the past couple days.  States that she has had continued pain for weeks.  Has been seen here for similar episodes.  Most recently 3 days ago.  Denies any fevers denies any urinary frequency urgency dysuria or hematuria.  Denies any chest pain or shortness of breath.  States that her pain is epigastric constant severe and 10/10 seems of been unchanged over the past week or 2.      Past Medical History:  Diagnosis Date   Arthritis    Back and legs   Bipolar disorder (HCC)    Chronic back pain    Depression    DVT (deep venous thrombosis) (HCC)    early 20's leg   GIB (gastrointestinal bleeding) 09/13/2017   History of blood transfusion    Hypertension    Migraine headache    Neuropathy    Pneumonia    Positive PPD    x 2 last time 09/2017   Sciatica    Stress incontinence     Patient Active Problem List   Diagnosis Date Noted   Overdose 04/13/2021   Hypoxemia 04/13/2021   Hypoglycemia 04/13/2021   Asthma exacerbation 04/01/2021   Anemia 04/01/2021   Iron deficiency anemia due to chronic blood loss 03/18/2021   Hyponatremia 03/18/2021   Marginal ulcer 03/15/2021   Microcytic anemia 02/26/2021   Mild protein malnutrition ([place]) 02/26/2021   Polysubstance abuse ([place]) 01/16/2021   Abdominal pain 01/14/2021   Ulcer at site of surgical  anastomosis following bypass of stomach 01/13/2021   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   Persistent fever 02/09/2019   COVID-19 virus not detected    Chest pain    Bacteremia due to Escherichia coli    Esophagitis    Hypokalemia    Hypomagnesemia    Sepsis ([place]) 02/02/2019   Leucocytosis 02/02/2019   Thrombocytosis 02/02/2019   UTI (urinary tract infection) 02/02/2019   Apnea 09/13/2017   Essential hypertension    Cellulitis and abscess of neck 08/12/2017   Cellulitis of submandibular region 08/10/2017   Odontogenic infection of jaw 08/10/2017   Cholecystitis 02/14/2017   Opioid abuse with opioid-induced mood disorder ([place]) 01/25/2017   Anxiety 02/11/2014   Depression 07/12/2012    Past Surgical History:  Procedure Laterality Date   ALVEOLOPLASTY Bilateral 01/31/2018   Procedure: ALVEOLOPLASTY;  Surgeon: Diona Browner, DDS;  Location: [facility];  Service: Oral Surgery;  Laterality: Bilateral;   ANKLE SURGERY Left  1992   with hardware   BACK SURGERY     BIOPSY  02/08/2019   Procedure: BIOPSY;  Surgeon: Rush Landmark Telford Nab., MD;  Location: [facility];  Service: Gastroenterology;;   CESAREAN SECTION     CHOLECYSTECTOMY N/A 02/15/2017   Procedure: LAPAROSCOPIC CHOLECYSTECTOMY;  Surgeon: Clovis Riley, MD;  [facility];  Service: General;  Laterality: N/A;   ENTEROSCOPY N/A 02/08/2019   Procedure: ENTEROSCOPY;  Surgeon: Rush Landmark Telford Nab., MD;  [facility];  Service: Gastroenterology;  Laterality: N/A;   ESOPHAGOGASTRODUODENOSCOPY N/A 01/17/2021   Procedure:  ESOPHAGOGASTRODUODENOSCOPY (EGD);  Surgeon: Juanita Craver, MD;  Location: [facility];  Service: Endoscopy;  Laterality: N/A;   ESOPHAGOGASTRODUODENOSCOPY (EGD) WITH PROPOFOL N/A 09/15/2017   Procedure: ESOPHAGOGASTRODUODENOSCOPY (EGD) WITH PROPOFOL;  Surgeon: Clarene Essex, MD;  [facility];  Service: Endoscopy;  Laterality: N/A;   ESOPHAGOGASTRODUODENOSCOPY (EGD) WITH PROPOFOL N/A 02/08/2019   Procedure: ESOPHAGOGASTRODUODENOSCOPY (EGD) WITH PROPOFOL;  Surgeon: Rush Landmark Telford Nab., MD;  [facility];  Service: Gastroenterology;  Laterality: N/A;   GASTRIC BYPASS     INCISION AND DRAINAGE OF PERITONSILLAR ABCESS Left 08/13/2017   Procedure: INCISION AND DRAINAGE OF LEFT NECK ABSCESS;  Surgeon: Melida Quitter, MD;  [facility];  Service: ENT;  Laterality: Left;   LAPAROSCOPIC LYSIS OF ADHESIONS  02/15/2017   Procedure: LAPAROSCOPIC LYSIS OF ADHESIONS;  Surgeon: Clovis Riley, MD;  [facility];  Service: General;;   LUMBAR DISC SURGERY     LUMBAR FUSION     SAVORY DILATION N/A 02/08/2019   Procedure: SAVORY DILATION;  Surgeon: Irving Copas., MD;  [facility];  Service: Gastroenterology;  Laterality: N/A;   TOOTH EXTRACTION Bilateral 01/31/2018   Procedure: DENTAL RESTORATION/EXTRACTIONS;  Surgeon: Diona Browner, DDS;  Location: [facility];  Service: Oral Surgery;  Laterality: Bilateral;   TUBAL LIGATION     UPPER GI ENDOSCOPY N/A 02/15/2017   Procedure: UPPER GI ENDOSCOPY;  Surgeon: Clovis Riley, MD;  [facility];  Service: General;  Laterality: N/A;     OB History   No obstetric history on file.     Family History  Problem Relation Age of Onset   Diabetes Mother    Hypertension Mother    Diabetes Father    Hypertension Father     Social History   Tobacco Use   Smoking status: Every Day    Packs/day: 1.00    Years: 29.00    Pack years: 29.00    Types: Cigarettes   Smokeless tobacco: Never  Vaping Use   Vaping Use: Never  used  Substance Use Topics   Alcohol use: No   Drug use: Yes    Types: IV, Cocaine    Home Medications Prior to Admission medications   Medication Sig Start Date End Date Taking? Authorizing Provider  pantoprazole (PROTONIX) 40 MG tablet Take 1 tablet (40 mg total) by mouth daily. 03/18/21  Yes Danford, Suann Larry, MD  acetaminophen (TYLENOL) 500 MG tablet Take 1,000 mg by mouth every 6 (six) hours as needed for mild pain. Patient not taking: Reported on 04/21/2021    [provider]  amLODipine (NORVASC) 10 MG tablet Take 1 tablet (10 mg total) by mouth daily. Patient not taking: Reported on 04/18/2021 04/06/21   Domenic Polite, MD  Ferrous Fumarate (IRON) 18 MG TBCR Take 1 tablet (18 mg total) by mouth 2 (two) times daily after a meal. Patient not taking: Reported on 04/18/2021 04/06/21   Domenic Polite, MD  hydrALAZINE (APRESOLINE) 50 MG tablet Take 1 tablet (50 mg total) by mouth 2 (two) times daily. Patient not taking: Reported on 04/18/2021 04/06/21   Domenic Polite, MD  hydrOXYzine (ATARAX/VISTARIL) 25 MG tablet Take 1 tablet (25 mg total) by mouth every 8 (eight) hours as needed for anxiety. Patient not taking: Reported on 04/18/2021 04/06/21   Domenic Polite, MD  lisinopril (ZESTRIL) 10 MG tablet Take 1 tablet (10 mg total) by mouth daily. Patient not taking: Reported on 04/18/2021 04/06/21   Domenic Polite, MD  methocarbamol (ROBAXIN) 750 MG tablet Take 1 tablet (750 mg total) by mouth every 8 (eight) hours as needed for muscle spasms. Patient not taking: Reported on 04/18/2021 04/06/21   Domenic Polite, MD  nicotine (NICODERM CQ - DOSED IN MG/24 HOURS) 21 mg/24hr patch Place 1 patch (21 mg total) onto the skin daily. Patient not taking: Reported on 04/18/2021 04/07/21   Domenic Polite, MD  traMADol (ULTRAM) 50 MG tablet Take 1 tablet (50 mg total) by mouth every 6 (six) hours as needed for moderate pain. Patient not taking: Reported on 04/18/2021 04/06/21    Domenic Polite, MD  vitamin B-12 (CYANOCOBALAMIN) 1000 MCG tablet Take 1 tablet (1,000 mcg total) by mouth daily. Patient not taking: Reported on 04/18/2021 04/06/21   Domenic Polite, MD    Allergies    Nsaids and Ketorolac tromethamine  Review of Systems   Review of Systems  Constitutional:  Negative for chills and fever.  HENT:  Negative for congestion.   Eyes:  Negative for pain.  Respiratory:  Negative for cough and shortness of breath.   Cardiovascular:  Negative for chest pain and leg swelling.  Gastrointestinal:  Positive for abdominal pain, nausea and vomiting. Negative for diarrhea.       Hematemesis   Genitourinary:  Negative for dysuria.  Musculoskeletal:  Negative for myalgias.  Skin:  Negative for rash.  Neurological:  Negative for dizziness and headaches.   Physical Exam Updated Vital Signs BP (!) 172/113 (BP Location: Right Arm)   Pulse 86   Temp 97.8 F (36.6 C) (Oral)   Resp 15   SpO2 97%   Physical Exam Vitals and nursing note reviewed.  Constitutional:      Comments: 45 year old female initially asleep in bed but arouses to verbal stimulus.  Appears quite uncomfortable upon waking up.  Endorsing severe pain and writhing in bed. Nontoxic-appearing.  HENT:     Head: Normocephalic and atraumatic.     Nose: Nose normal.  Eyes:     General: No scleral icterus. Cardiovascular:     Rate and Rhythm: Normal rate and regular rhythm.     Pulses: Normal pulses.     Heart sounds: Normal heart sounds.  Pulmonary:     Effort: Pulmonary effort is normal. No respiratory distress.     Breath sounds: No wheezing.  Abdominal:     Palpations: Abdomen is soft.     Tenderness: There is abdominal tenderness.     Comments: Well healed surgical scars  Epigastric TTP  Musculoskeletal:     Cervical back: Normal range of motion.     Right lower leg: No edema.     Left lower leg: No edema.  Skin:    General: Skin is warm and dry.     Capillary Refill: Capillary  refill takes less than 2 seconds.  Neurological:     Mental Status: She is alert. Mental status is at baseline.  Psychiatric:        Mood  and Affect: Mood normal.        Behavior: Behavior normal.    ED Results / Procedures / Treatments   Labs (all labs ordered are listed, but only abnormal results are displayed) Labs Reviewed  CBC WITH DIFFERENTIAL/PLATELET - Abnormal; Notable for the following components:      Result Value   Hemoglobin 11.7 (*)    MCV 72.6 (*)    MCH 23.6 (*)    RDW 25.1 (*)    All other components within normal limits  COMPREHENSIVE METABOLIC PANEL - Abnormal; Notable for the following components:   Sodium 134 (*)    AST 13 (*)    All other components within normal limits  LIPASE, BLOOD  URINALYSIS, ROUTINE W REFLEX MICROSCOPIC  RAPID URINE DRUG SCREEN, HOSP PERFORMED    EKG None  Radiology DG ABD ACUTE 2+V W 1V CHEST  Result Date: 04/21/2021 CLINICAL DATA:  45 year old female with abdominal pain. Hematemesis. EXAM: DG ABDOMEN ACUTE WITH 1 VIEW CHEST COMPARISON:  CT Abdomen and Pelvis 04/18/2021 and earlier. FINDINGS: Larger lung volumes compared to 2018 chest radiographs. Normal cardiac size and mediastinal contours. Visualized tracheal air column is within normal limits. Both lungs appear clear. No pneumothorax or pneumoperitoneum. Upright and supine views of the abdomen and pelvis. Stable cholecystectomy clips. Bowel staple line in the left upper quadrant. Non obstructed bowel gas pattern. Pelvic phleboliths. Other abdominal and pelvic visceral contours are within normal limits. No acute osseous abnormality identified. Lumbar disc and endplate degeneration. IMPRESSION: 1. Normal bowel gas pattern, no free air. 2. No acute cardiopulmonary abnormality. Electronically Signed   By: Genevie Ann M.D.   On: 04/21/2021 07:33    Procedures Procedures   Medications Ordered in ED Medications  alum & mag hydroxide-simeth (MAALOX/MYLANTA) 200-200-20 MG/5ML suspension  30 mL (30 mLs Oral Given 04/21/21 0911)    And  lidocaine (XYLOCAINE) 2 % viscous mouth solution 15 mL (15 mLs Oral Not Given 04/21/21 0910)  amLODipine (NORVASC) tablet 10 mg (10 mg Oral Given 04/21/21 0804)  pantoprazole (PROTONIX) injection 40 mg (40 mg Intravenous Given 04/21/21 0804)  ondansetron (ZOFRAN) injection 4 mg (4 mg Intravenous Given 04/21/21 0804)  sodium chloride 0.9 % bolus 500 mL (0 mLs Intravenous Stopped 04/21/21 1021)  HYDROmorphone (DILAUDID) injection 0.5 mg (0.5 mg Intravenous Given 04/21/21 0910)  hydrALAZINE (APRESOLINE) injection 10 mg (10 mg Intravenous Given 04/21/21 1021)    ED Course  I have reviewed the triage vital signs and the nursing notes.  Pertinent labs & imaging results that were available during my care of the patient were reviewed by me and considered in my medical decision making (see chart for details).  Clinical Course as of 04/21/21 1031  Tue Apr 21, 2021  9528 Acute abdomen chest negative for any perforation.  IMPRESSION: 1. Normal bowel gas pattern, no free air. 2. No acute cardiopulmonary abnormality.  [WF]  0857 $4 for meds [WF]    Clinical Course User Index [WF] Tedd Sias, PA   MDM Rules/Calculators/A&P                          Patient is 45 year old female with past medical history detailed in HPI  She is presented to the ER today with complaints of epigastric abdominal pain that is severe constant cramping.  Symptoms been an issue episodically for some time.  She states that at this moment she is nauseous and having pain.  She has not been  taking any of her prescribed antihypertensives she states she cannot afford them.  I discussed with TOC and patient --these medications are available for $4 each.  Her total cost for her medications that she is required to take will be $12.  She is able to afford this.  TOC able to provide patient with follow-up appointments in 1 month.  CBC without leukocytosis or significant  anemia.  CMP unremarkable lipase within normal limits.  Patient can oral amlodipine here.  Also given pain medicine and anti-emetic   GI cocktail and Protonix as well.  Patient states she feels much improved some of this seems to be after the Dilaudid.  Abdomen chest acute x-ray series obtained by triage PA.  I reviewed these images and are reassuring.  No evidence of perforation  Patient's abdominal exam is much more benign after fluids and medication.  She has follow-up with primary care scheduled female.  Will recommend that she stop using recreational drugs and follow-up with PCP.  Patient does have some behaviors concerning for narcotic seeking behavior.  Pt has scheduled appointment w pcp December 29th (thanks to [facility] for their consult).   Final Clinical Impression(s) / ED Diagnoses Final diagnoses:  Epigastric pain    Rx / DC Orders ED Discharge Orders     None        Tedd Sias, [place] 04/21/21 1032    Dorie Rank, MD 04/22/21 1824

## 2021-04-21 NOTE — Progress Notes (Addendum)
[id number]/tct-[facility]/request for pcp follow up [facility] does not open until 08230 wcb  Appt with [facility] is Dec.29 at 230pm with Dr. Wynetta Emery.

## 2021-04-21 NOTE — ED Provider Notes (Signed)
Emergency Medicine Provider Triage Evaluation Note  TALISHIA BETZLER , a 45 y.o. female  was evaluated in triage.  Pt complains of hematemesis.  States she thinks she has a bad stomach ulcer.  Has been taking meds as prescribed.  Denies any fever or chills.  Reports associated vomiting.  Review of Systems  Positive:  Epigastric pain, vomiting Negative: Fever, chills  Physical Exam  BP (!) 188/118   Pulse 79   Temp 97.8 F (36.6 C) (Oral)   Resp 20   SpO2 99%  Gen:   Awake, no distress   Resp:  Normal effort  MSK:   Moves extremities without difficulty  Other:  Epigastric tenderness  Medical Decision Making  Medically screening exam initiated at 5:11 AM.  Appropriate orders placed.  [name] was informed that the remainder of the evaluation will be completed by another provider, this initial triage assessment does not replace that evaluation, and the importance of remaining in the ED until their evaluation is complete.  Epigastric pain, hematemesis   Montine Circle, Hershal Coria 04/21/21 7829    Quintella Reichert, MD 04/21/21 [phone]

## 2021-04-21 NOTE — Discharge Instructions (Addendum)
You have an appointment at the [facility] please arrive at 2:07 May 2028  I recommend drinking plenty of water.  Please do not use any recreational drugs or alcohol.  Use Zofran as needed for nausea.  Please take your blood pressure medications as prescribed I have sent printed all of these prescriptions see me take them to the pharmacy of your choosing.

## 2021-04-21 NOTE — ED Notes (Signed)
Pt eating and drinking w/o issues.

## 2021-04-21 NOTE — ED Triage Notes (Signed)
Pt reports hematemesis and abdominal pain that started last night and worsened over night.

## 2021-04-21 NOTE — Progress Notes (Signed)
.  Transition of Care [facility]) - Emergency Department Mini Assessment   Patient Details  Name: Terri [name] MRN: [id number] Date of Birth: 03/18/1976  [facility] CM/SW Contact:    Illene Regulus, LCSW Phone Number: 04/21/2021, 11:04 AM   Clinical Narrative:  CSW spoke with pt , and inform pt she does not meet [facility] program assist for meds. CSW contacted [facility] pharmacy they stated pt can be billed for meds when she picks up. Pt is aware of follow up appointment with [facility] wellness.TOC CSW sign off.  ED Mini Assessment:    Barriers to Discharge: No Barriers Identified  Barrier interventions: hematemesis and abdominal pain     Interventions which prevented an admission or readmission: Transportation Screening, Medication Review, Follow-up medical appointment    Patient Contact and Communications        ,                 Admission diagnosis:  Abdominal Pain Patient Active Problem List   Diagnosis Date Noted   Overdose 04/13/2021   Hypoxemia 04/13/2021   Hypoglycemia 04/13/2021   Asthma exacerbation 04/01/2021   Anemia 04/01/2021   Iron deficiency anemia due to chronic blood loss 03/18/2021   Hyponatremia 03/18/2021   Marginal ulcer 03/15/2021   Microcytic anemia 02/26/2021   Mild protein malnutrition ([place]) 02/26/2021   Polysubstance abuse ([place]) 01/16/2021   Abdominal pain 01/14/2021   Ulcer at site of surgical anastomosis following bypass of stomach 01/13/2021   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   Persistent fever 02/09/2019   COVID-19 virus not  detected    Chest pain    Bacteremia due to Escherichia coli    Esophagitis    Hypokalemia    Hypomagnesemia    Sepsis ([place]) 02/02/2019   Leucocytosis 02/02/2019   Thrombocytosis 02/02/2019   UTI (urinary tract infection) 02/02/2019   Apnea 09/13/2017   Essential hypertension    Cellulitis and abscess of neck 08/12/2017   Cellulitis of submandibular region 08/10/2017   Odontogenic infection of jaw 08/10/2017   Cholecystitis 02/14/2017   Opioid abuse with opioid-induced mood disorder ([place]) 01/25/2017   Anxiety 02/11/2014   Depression 07/12/2012   PCP:  Pcp, No Pharmacy:   [facility] [address] [place] [address] Phone: [phone] Fax: [phone]

## 2021-04-30 IMAGING — CR DG CHEST 2V
2 series · 2 of 2 positions shown · non-contrast
Comparison: 02/07/2019

CLINICAL DATA: Fever

EXAM:
CHEST - 2 VIEW

[chest lat]
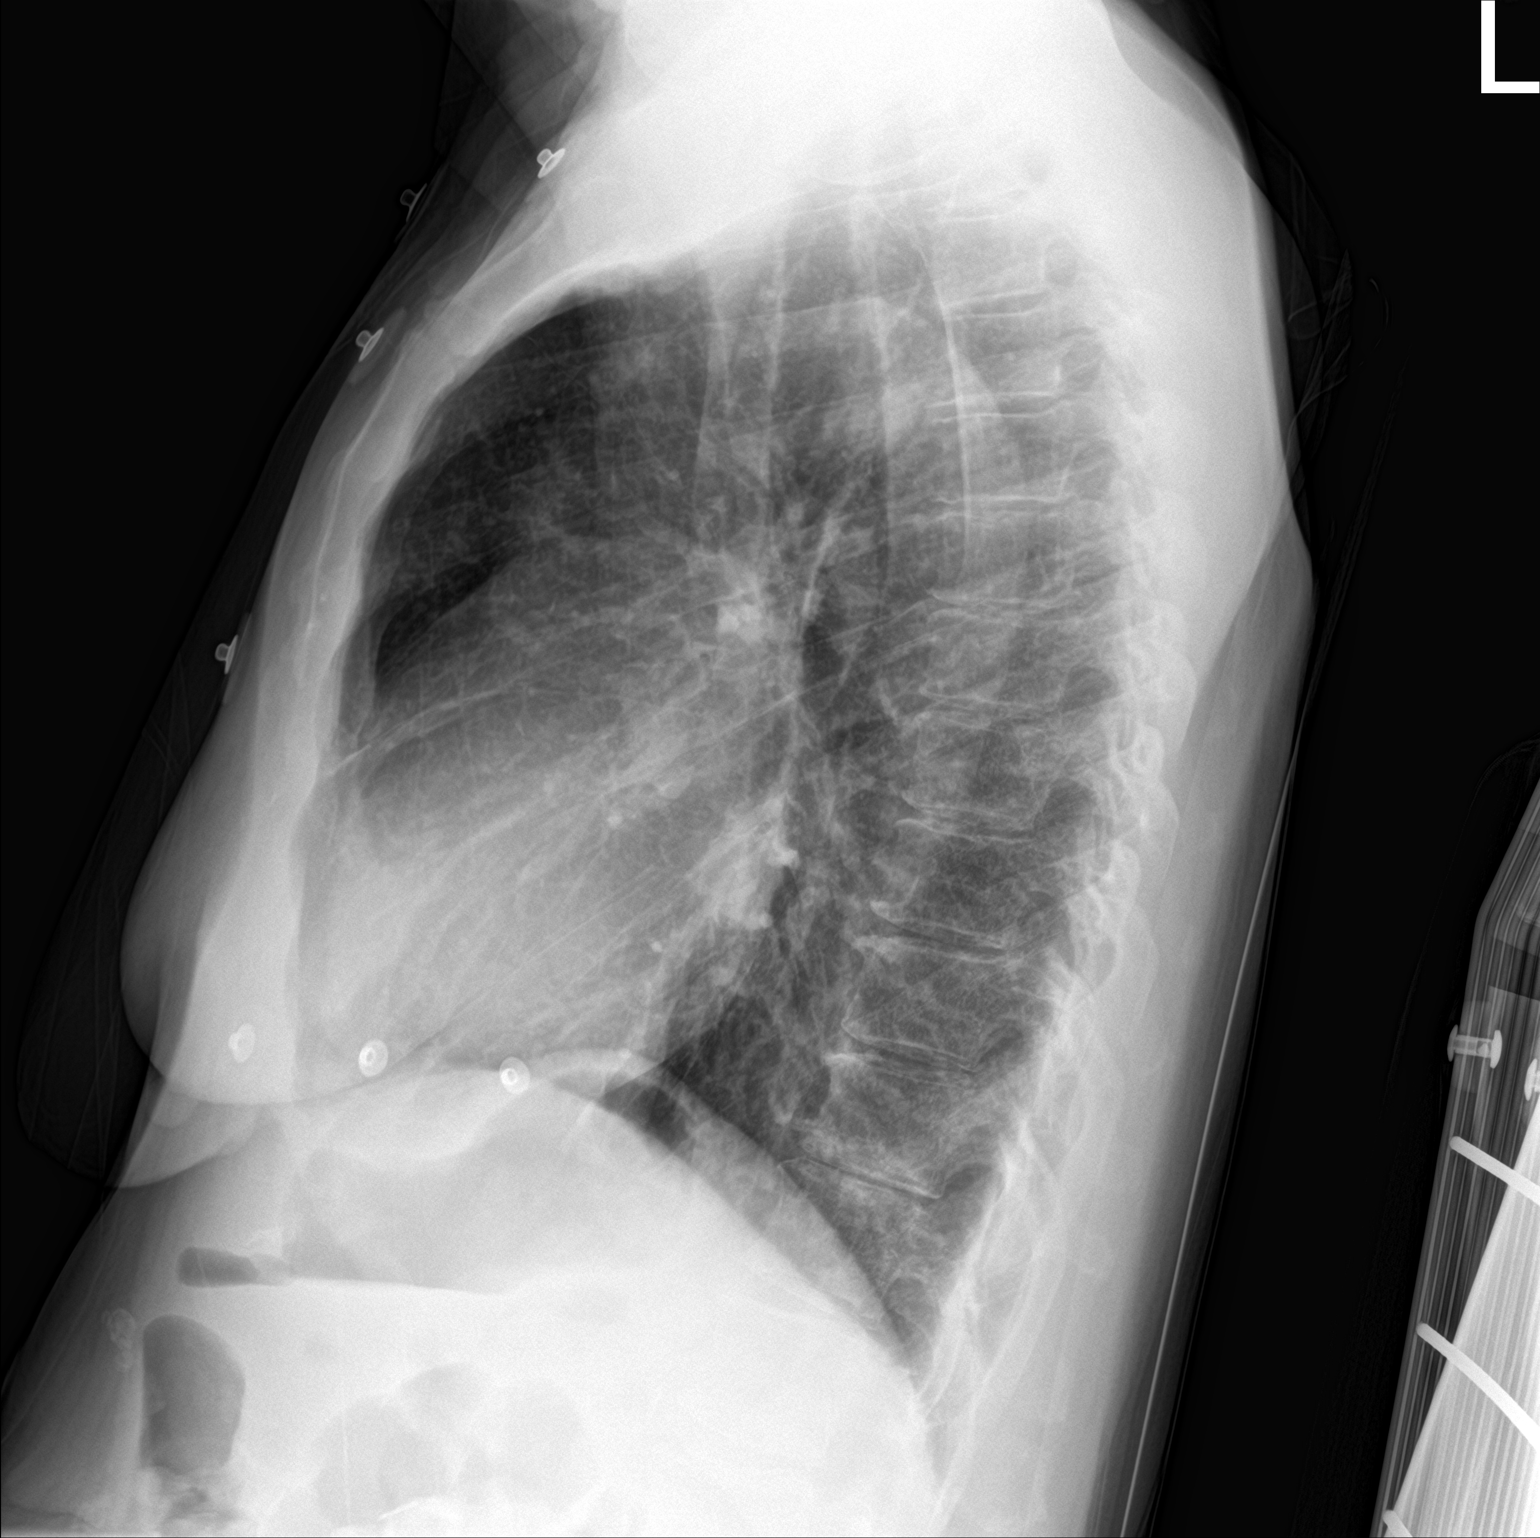

[chest ap]
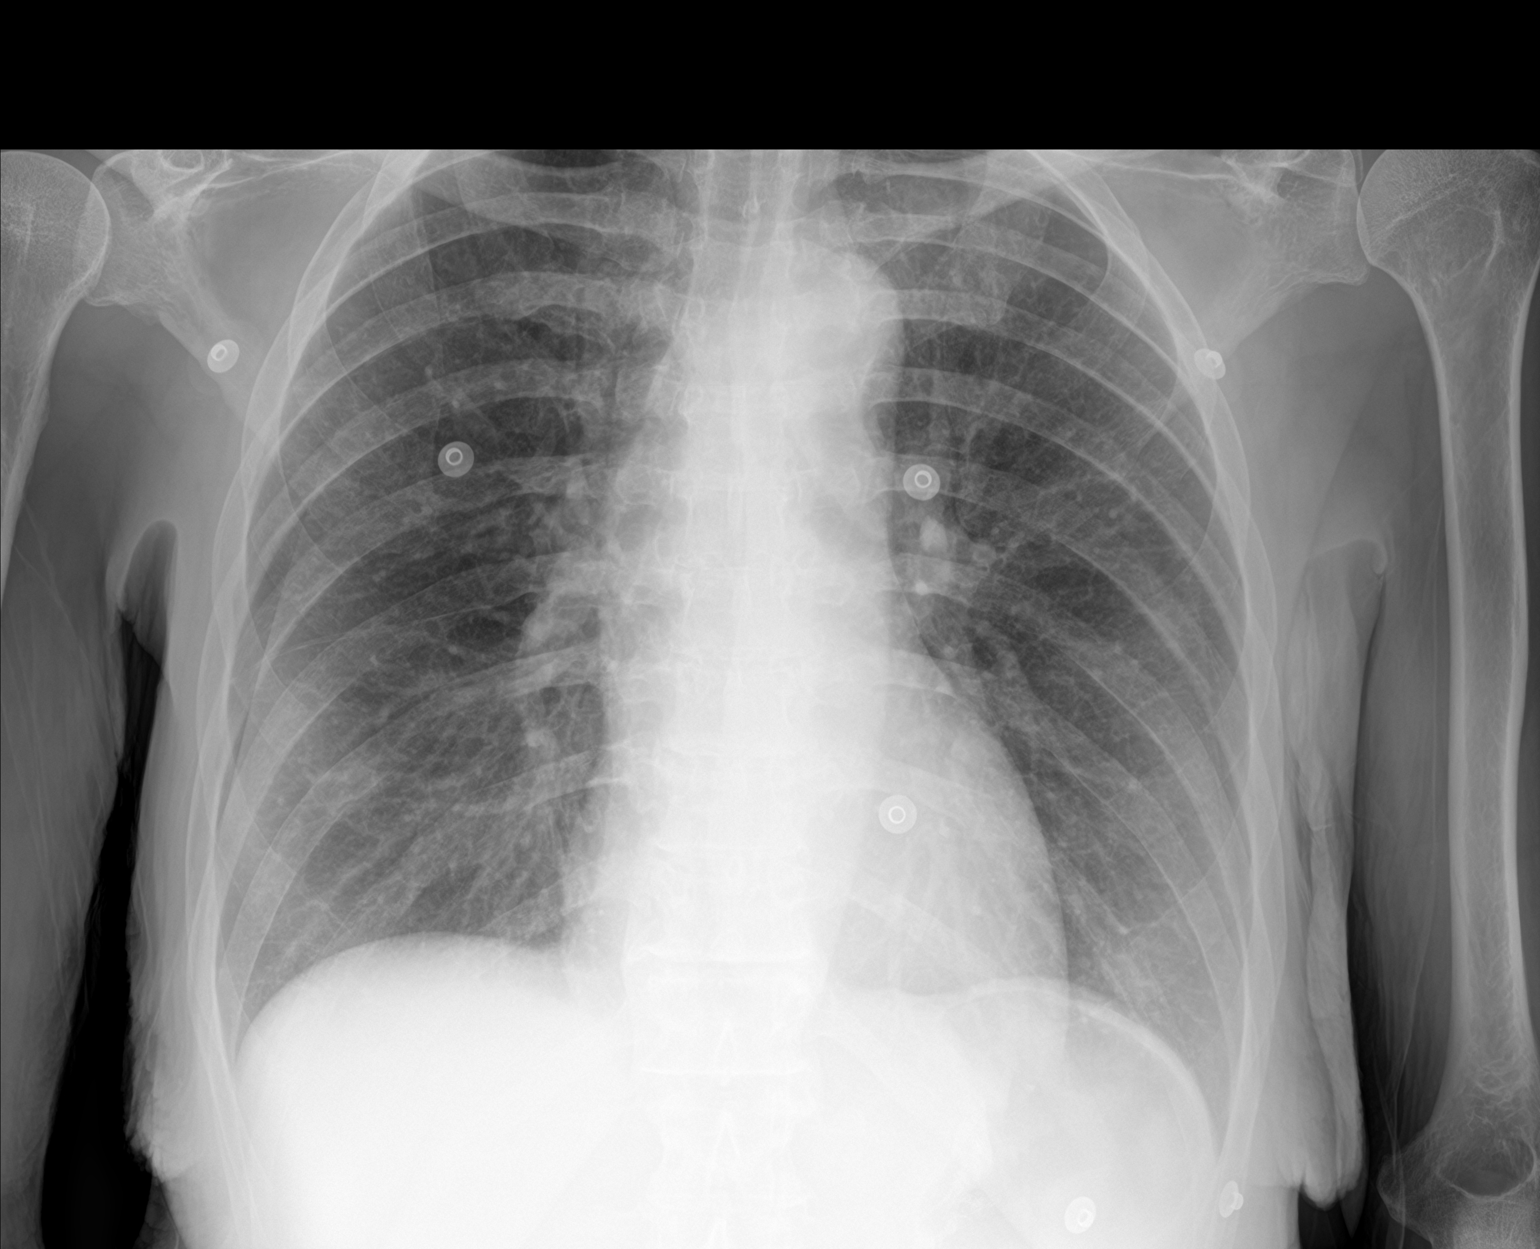

[2 of 2 positions shown; findings below may reference images not displayed]

FINDINGS: The heart size and mediastinal contours are within normal limits.
Both lungs are clear. The visualized skeletal structures are
unremarkable.
IMPRESSION: No active cardiopulmonary disease.

## 2021-05-21 ENCOUNTER — Other Ambulatory Visit: Payer: Self-pay

## 2021-05-21 ENCOUNTER — Emergency Department (HOSPITAL_COMMUNITY)
Admission: EM | Admit: 2021-05-21 | Discharge: 2021-05-21 | Disposition: A | Discharge status: Home / Self Care | Payer: Medicaid Other | Source: Home / Self Care

## 2021-05-21 ENCOUNTER — Encounter (HOSPITAL_COMMUNITY): Payer: Self-pay

## 2021-05-21 ENCOUNTER — Inpatient Hospital Stay: Payer: Medicaid Other | Admitting: Internal Medicine

## 2021-05-21 DIAGNOSIS — Z5321 Procedure and treatment not carried out due to patient leaving prior to being seen by health care provider: Secondary | ICD-10-CM | POA: Insufficient documentation

## 2021-05-21 DIAGNOSIS — L089 Local infection of the skin and subcutaneous tissue, unspecified: Secondary | ICD-10-CM | POA: Insufficient documentation

## 2021-05-21 NOTE — ED Triage Notes (Signed)
BIBA Per EMS: pt has wound on right leg which seems to be infected. Redness to wound. Pt reports not knowing where wound came from. Denies injury to leg. No hx of diabetes.  Vitals WDL

## 2021-05-22 ENCOUNTER — Emergency Department (HOSPITAL_COMMUNITY): Payer: Medicaid Other

## 2021-05-22 ENCOUNTER — Encounter (HOSPITAL_COMMUNITY): Payer: Self-pay | Admitting: Emergency Medicine

## 2021-05-22 ENCOUNTER — Inpatient Hospital Stay (HOSPITAL_COMMUNITY)
Admission: EM | Admit: 2021-05-22 | Discharge: 2021-05-24 | DRG: 871 | Discharge status: Against Medical Advice | Payer: Medicaid Other | Attending: Family Medicine | Admitting: Family Medicine

## 2021-05-22 DIAGNOSIS — D509 Iron deficiency anemia, unspecified: Secondary | ICD-10-CM | POA: Diagnosis present

## 2021-05-22 DIAGNOSIS — Z981 Arthrodesis status: Secondary | ICD-10-CM

## 2021-05-22 DIAGNOSIS — L0390 Cellulitis, unspecified: Secondary | ICD-10-CM | POA: Diagnosis present

## 2021-05-22 DIAGNOSIS — I10 Essential (primary) hypertension: Secondary | ICD-10-CM | POA: Diagnosis present

## 2021-05-22 DIAGNOSIS — F1410 Cocaine abuse, uncomplicated: Secondary | ICD-10-CM | POA: Diagnosis present

## 2021-05-22 DIAGNOSIS — R64 Cachexia: Secondary | ICD-10-CM | POA: Diagnosis present

## 2021-05-22 DIAGNOSIS — A4151 Sepsis due to Escherichia coli [E. coli]: Secondary | ICD-10-CM

## 2021-05-22 DIAGNOSIS — F17210 Nicotine dependence, cigarettes, uncomplicated: Secondary | ICD-10-CM | POA: Diagnosis present

## 2021-05-22 DIAGNOSIS — Z9884 Bariatric surgery status: Secondary | ICD-10-CM

## 2021-05-22 DIAGNOSIS — E876 Hypokalemia: Secondary | ICD-10-CM | POA: Diagnosis present

## 2021-05-22 DIAGNOSIS — F319 Bipolar disorder, unspecified: Secondary | ICD-10-CM | POA: Diagnosis present

## 2021-05-22 DIAGNOSIS — G8929 Other chronic pain: Secondary | ICD-10-CM | POA: Diagnosis present

## 2021-05-22 DIAGNOSIS — E872 Acidosis, unspecified: Secondary | ICD-10-CM | POA: Diagnosis present

## 2021-05-22 DIAGNOSIS — Z86718 Personal history of other venous thrombosis and embolism: Secondary | ICD-10-CM

## 2021-05-22 DIAGNOSIS — G629 Polyneuropathy, unspecified: Secondary | ICD-10-CM | POA: Diagnosis present

## 2021-05-22 DIAGNOSIS — Z9049 Acquired absence of other specified parts of digestive tract: Secondary | ICD-10-CM

## 2021-05-22 DIAGNOSIS — Z681 Body mass index (BMI) 19 or less, adult: Secondary | ICD-10-CM | POA: Diagnosis not present

## 2021-05-22 DIAGNOSIS — F1910 Other psychoactive substance abuse, uncomplicated: Secondary | ICD-10-CM | POA: Diagnosis present

## 2021-05-22 DIAGNOSIS — Z886 Allergy status to analgesic agent status: Secondary | ICD-10-CM

## 2021-05-22 DIAGNOSIS — L03115 Cellulitis of right lower limb: Secondary | ICD-10-CM

## 2021-05-22 DIAGNOSIS — W57XXXA Bitten or stung by nonvenomous insect and other nonvenomous arthropods, initial encounter: Secondary | ICD-10-CM | POA: Diagnosis present

## 2021-05-22 DIAGNOSIS — E871 Hypo-osmolality and hyponatremia: Secondary | ICD-10-CM | POA: Diagnosis present

## 2021-05-22 DIAGNOSIS — Z23 Encounter for immunization: Secondary | ICD-10-CM | POA: Diagnosis not present

## 2021-05-22 DIAGNOSIS — Z720 Tobacco use: Secondary | ICD-10-CM | POA: Diagnosis present

## 2021-05-22 DIAGNOSIS — Z716 Tobacco abuse counseling: Secondary | ICD-10-CM

## 2021-05-22 DIAGNOSIS — R6520 Severe sepsis without septic shock: Secondary | ICD-10-CM | POA: Diagnosis present

## 2021-05-22 DIAGNOSIS — Z79899 Other long term (current) drug therapy: Secondary | ICD-10-CM

## 2021-05-22 DIAGNOSIS — L97919 Non-pressure chronic ulcer of unspecified part of right lower leg with unspecified severity: Secondary | ICD-10-CM | POA: Diagnosis present

## 2021-05-22 DIAGNOSIS — Z79891 Long term (current) use of opiate analgesic: Secondary | ICD-10-CM

## 2021-05-22 DIAGNOSIS — Z888 Allergy status to other drugs, medicaments and biological substances status: Secondary | ICD-10-CM

## 2021-05-22 DIAGNOSIS — L538 Other specified erythematous conditions: Secondary | ICD-10-CM

## 2021-05-22 DIAGNOSIS — M1990 Unspecified osteoarthritis, unspecified site: Secondary | ICD-10-CM | POA: Diagnosis present

## 2021-05-22 DIAGNOSIS — E538 Deficiency of other specified B group vitamins: Secondary | ICD-10-CM | POA: Diagnosis present

## 2021-05-22 DIAGNOSIS — U071 COVID-19: Secondary | ICD-10-CM | POA: Diagnosis present

## 2021-05-22 DIAGNOSIS — N390 Urinary tract infection, site not specified: Secondary | ICD-10-CM | POA: Diagnosis present

## 2021-05-22 DIAGNOSIS — M7989 Other specified soft tissue disorders: Secondary | ICD-10-CM

## 2021-05-22 DIAGNOSIS — F1114 Opioid abuse with opioid-induced mood disorder: Secondary | ICD-10-CM | POA: Diagnosis not present

## 2021-05-22 DIAGNOSIS — K279 Peptic ulcer, site unspecified, unspecified as acute or chronic, without hemorrhage or perforation: Secondary | ICD-10-CM

## 2021-05-22 DIAGNOSIS — A419 Sepsis, unspecified organism: Principal | ICD-10-CM | POA: Diagnosis present

## 2021-05-22 DIAGNOSIS — E878 Other disorders of electrolyte and fluid balance, not elsewhere classified: Secondary | ICD-10-CM | POA: Diagnosis present

## 2021-05-22 LAB — URINALYSIS, ROUTINE W REFLEX MICROSCOPIC
Bilirubin Urine: NEGATIVE
Glucose, UA: NEGATIVE mg/dL
Ketones, ur: NEGATIVE mg/dL
Nitrite: POSITIVE — AB
Protein, ur: 30 mg/dL — AB
Specific Gravity, Urine: 1.011 (ref 1.005–1.030)
WBC, UA: >50 WBC/hpf — ABNORMAL HIGH (ref 0–5)
pH: 6 (ref 5.0–8.0)

## 2021-05-22 LAB — CBC WITH DIFFERENTIAL/PLATELET
Abs Immature Granulocytes: 0.23 10*3/uL — ABNORMAL HIGH (ref 0.00–0.07)
Basophils Absolute: 0.1 10*3/uL (ref 0.0–0.1)
Basophils Relative: 0 %
Eosinophils Absolute: 0 10*3/uL (ref 0.0–0.5)
Eosinophils Relative: 0 %
HCT: 32.6 % — ABNORMAL LOW (ref 36.0–46.0)
Hemoglobin: 10.8 g/dL — ABNORMAL LOW (ref 12.0–15.0)
Immature Granulocytes: 1 %
Lymphocytes Relative: 2 %
Lymphs Abs: 0.3 10*3/uL — ABNORMAL LOW (ref 0.7–4.0)
MCH: 23.7 pg — ABNORMAL LOW (ref 26.0–34.0)
MCHC: 33.1 g/dL (ref 30.0–36.0)
MCV: 71.6 fL — ABNORMAL LOW (ref 80.0–100.0)
Monocytes Absolute: 0.6 10*3/uL (ref 0.1–1.0)
Monocytes Relative: 3 %
Neutro Abs: 19.4 10*3/uL — ABNORMAL HIGH (ref 1.7–7.7)
Neutrophils Relative %: 94 %
Platelets: 274 10*3/uL (ref 150–400)
RBC: 4.55 MIL/uL (ref 3.87–5.11)
RDW: 21.2 % — ABNORMAL HIGH (ref 11.5–15.5)
WBC: 20.6 10*3/uL — ABNORMAL HIGH (ref 4.0–10.5)
nRBC: 0 % (ref 0.0–0.2)

## 2021-05-22 LAB — BASIC METABOLIC PANEL
Anion gap: 9 (ref 5–15)
BUN: 17 mg/dL (ref 6–20)
CO2: 26 mmol/L (ref 22–32)
Calcium: 7.9 mg/dL — ABNORMAL LOW (ref 8.9–10.3)
Chloride: 95 mmol/L — ABNORMAL LOW (ref 98–111)
Creatinine, Ser: 0.76 mg/dL (ref 0.44–1.00)
GFR, Estimated: >60 mL/min (ref >60)
Glucose, Bld: 138 mg/dL — ABNORMAL HIGH (ref 70–99)
Potassium: 3.5 mmol/L (ref 3.5–5.1)
Sodium: 130 mmol/L — ABNORMAL LOW (ref 135–145)

## 2021-05-22 LAB — I-STAT BETA HCG BLOOD, ED (MC, WL, AP ONLY): I-stat hCG, quantitative: <5 m[IU]/mL (ref <5)

## 2021-05-22 LAB — LACTIC ACID, PLASMA
Lactic Acid, Venous: 2.4 mmol/L (ref 0.5–1.9)
Lactic Acid, Venous: 2.7 mmol/L (ref 0.5–1.9)

## 2021-05-22 LAB — APTT: aPTT: 31 seconds (ref 24–36)

## 2021-05-22 LAB — RESP PANEL BY RT-PCR (FLU A&B, COVID) ARPGX2
Influenza A by PCR: NEGATIVE
Influenza B by PCR: NEGATIVE
SARS Coronavirus 2 by RT PCR: POSITIVE — AB

## 2021-05-22 LAB — PROTIME-INR
INR: 1.1 (ref 0.8–1.2)
Prothrombin Time: 14.1 seconds (ref 11.4–15.2)

## 2021-05-22 MED ORDER — PANTOPRAZOLE SODIUM 40 MG PO TBEC
40.0000 mg | DELAYED_RELEASE_TABLET | Freq: Every day | ORAL | Status: DC
  Administered 2021-05-23 – 2021-05-24 (×2): 40 mg via ORAL
  Filled 2021-05-22 (×2): qty 1

## 2021-05-22 MED ORDER — SODIUM CHLORIDE 0.9 % IV SOLN
75.0000 mL/h | INTRAVENOUS | Status: AC
  Administered 2021-05-23: 75 mL/h via INTRAVENOUS

## 2021-05-22 MED ORDER — METHOCARBAMOL 500 MG PO TABS: 750.0000 mg | ORAL_TABLET | Freq: Three times a day (TID) | ORAL | Status: DC | PRN

## 2021-05-22 MED ORDER — ACETAMINOPHEN 325 MG PO TABS: 650.0000 mg | ORAL_TABLET | Freq: Four times a day (QID) | ORAL | Status: DC | PRN

## 2021-05-22 MED ORDER — VANCOMYCIN HCL 1250 MG/250ML IV SOLN
1250.0000 mg | INTRAVENOUS | Status: DC
Start: 2021-05-23 — End: 2021-05-24
  Administered 2021-05-23 – 2021-05-24 (×2): 1250 mg via INTRAVENOUS
  Filled 2021-05-22 (×2): qty 250

## 2021-05-22 MED ORDER — VANCOMYCIN HCL IN DEXTROSE 1-5 GM/200ML-% IV SOLN
1000.0000 mg | Freq: Once | INTRAVENOUS | Status: AC
  Administered 2021-05-22: 18:00:00 1000 mg via INTRAVENOUS
  Filled 2021-05-22: qty 200

## 2021-05-22 MED ORDER — TETANUS-DIPHTH-ACELL PERTUSSIS 5-2.5-18.5 LF-MCG/0.5 IM SUSY
0.5000 mL | PREFILLED_SYRINGE | Freq: Once | INTRAMUSCULAR | Status: DC
  Filled 2021-05-22: qty 0.5

## 2021-05-22 MED ORDER — MORPHINE SULFATE (PF) 4 MG/ML IV SOLN
4.0000 mg | Freq: Once | INTRAVENOUS | Status: AC
  Administered 2021-05-22: 19:00:00 4 mg via INTRAVENOUS
  Filled 2021-05-22: qty 1

## 2021-05-22 MED ORDER — LACTATED RINGERS IV BOLUS (SEPSIS)
1000.0000 mL | Freq: Once | INTRAVENOUS | Status: AC
  Administered 2021-05-22: 18:00:00 1000 mL via INTRAVENOUS

## 2021-05-22 MED ORDER — SODIUM CHLORIDE 0.9 % IV SOLN
2.0000 g | INTRAVENOUS | Status: DC
  Administered 2021-05-23: 2 g via INTRAVENOUS
  Filled 2021-05-22 (×2): qty 20

## 2021-05-22 MED ORDER — ACETAMINOPHEN 650 MG RE SUPP: 650.0000 mg | Freq: Four times a day (QID) | RECTAL | Status: DC | PRN

## 2021-05-22 MED ORDER — LACTATED RINGERS IV SOLN: INTRAVENOUS | Status: DC

## 2021-05-22 MED ORDER — LACTATED RINGERS IV BOLUS (SEPSIS)
250.0000 mL | Freq: Once | INTRAVENOUS | Status: AC
  Administered 2021-05-22: 21:00:00 250 mL via INTRAVENOUS

## 2021-05-22 MED ORDER — SODIUM CHLORIDE 0.9 % IV SOLN: 2.0000 g | Freq: Two times a day (BID) | INTRAVENOUS | Status: DC

## 2021-05-22 MED ORDER — HYDROXYZINE HCL 25 MG PO TABS
25.0000 mg | ORAL_TABLET | Freq: Three times a day (TID) | ORAL | Status: DC | PRN
  Administered 2021-05-23 – 2021-05-24 (×2): 25 mg via ORAL
  Filled 2021-05-22 (×2): qty 1

## 2021-05-22 MED ORDER — SODIUM CHLORIDE 0.9 % IV SOLN
2.0000 g | Freq: Once | INTRAVENOUS | Status: AC
  Administered 2021-05-22: 18:00:00 2 g via INTRAVENOUS
  Filled 2021-05-22: qty 20

## 2021-05-22 MED ORDER — LACTATED RINGERS IV BOLUS (SEPSIS)
500.0000 mL | Freq: Once | INTRAVENOUS | Status: AC
  Administered 2021-05-22: 21:00:00 500 mL via INTRAVENOUS

## 2021-05-22 MED ORDER — TRAMADOL HCL 50 MG PO TABS
50.0000 mg | ORAL_TABLET | Freq: Four times a day (QID) | ORAL | Status: DC | PRN
  Administered 2021-05-23 – 2021-05-24 (×4): 50 mg via ORAL
  Filled 2021-05-22 (×4): qty 1

## 2021-05-22 NOTE — Assessment & Plan Note (Signed)
Allow permissive htn ?

## 2021-05-22 NOTE — Progress Notes (Signed)
A consult was received from an ED physician for Vancomycin per pharmacy dosing.  The patient's profile has been reviewed for ht/wt/allergies/indication/available labs.   A one time order has been placed for Vancomycin 1g.  Further antibiotics/pharmacy consults should be ordered by admitting physician if indicated.                       Thank you,  Gretta Arab PharmD, BCPS [facility] pharmacy [phone] 05/22/2021 5:13 PM

## 2021-05-22 NOTE — Assessment & Plan Note (Signed)
treat for withdrawal

## 2021-05-22 NOTE — Assessment & Plan Note (Signed)
-  SIRS criteria met with  elevated white blood cell count,       Component Value Date/Time   WBC 20.6 (H) 05/22/2021 1610   LYMPHSABS 0.3 (L) 05/22/2021 1610     tachycardia   ,  RR >20 Today's Vitals   05/22/21 1930 05/22/21 1956 05/22/21 2030 05/22/21 2100  BP: 131/81  126/90 (!) 163/100  Pulse: 97  (!) 107 96  Resp: 19  (!) 29 18  Temp:      TempSrc:      SpO2: 98%  96% 98%  PainSc:  7         The recent clinical data is shown below. Vitals:   05/22/21 1900 05/22/21 1930 05/22/21 2030 05/22/21 2100  BP: 131/80 131/81 126/90 (!) 163/100  Pulse: (!) 101 97 (!) 107 96  Resp:  19 (!) 29 18  Temp:      TempSrc:      SpO2: 100% 98% 96% 98%       -Most likely source being  Cellulitis,      Patient meeting criteria for Severe sepsis with    evidence of end organ damage/organ dysfunction such as       Component Value Date/Time   LATICACIDVEN 2.7 ([place]) 05/22/2021 1857      elevated lactic acid >2    - Obtain serial lactic acid and procalcitonin level.  - Initiated IV antibiotics in ER: Antibiotics Given (last 72 hours)    Date/Time Action Medication Dose Rate   05/22/21 1819 New Bag/Given   cefTRIAXone (ROCEPHIN) 2 g in sodium chloride 0.9 % 100 mL IVPB 2 g 200 mL/hr   05/22/21 1823 New Bag/Given   vancomycin (VANCOCIN) IVPB 1000 mg/200 mL premix 1,000 mg 200 mL/hr      Will continue     - await results of blood and urine culture  - Rehydrate aggressively  Intravenous fluids were administered    30cc/kg fluid    9:17 PM

## 2021-05-22 NOTE — ED Triage Notes (Signed)
Patient here from home reporting wound to right lower leg. Redness noted. X1 week. Seen for same yest.

## 2021-05-22 NOTE — ED Notes (Signed)
Critical Lab: Lactic Acid 2.4

## 2021-05-22 NOTE — Progress Notes (Signed)
Pharmacy Antibiotic Note  Terri [name] is a 45 y.o. female admitted on 05/22/2021 with wound to the right lower leg.  Pharmacy has been consulted to dose vancomycin for cellulitis.  1st dose given in ED  Plan: Vancomycin 1250mg  IV q24h (AUC 480.9, Scr 0.8, TBW) Follow renal function and clinical course     Temp (24hrs), Avg:98.9 F (37.2 C), Min:98.9 F (37.2 C), Max:98.9 F (37.2 C)  Recent Labs  Lab 05/22/21 1610 05/22/21 1704 05/22/21 1857  WBC 20.6*  --   --   CREATININE 0.76  --   --   LATICACIDVEN  --  2.4* 2.7*    Estimated Creatinine Clearance: 74.7 mL/min (by C-G formula based on SCr of 0.76 mg/dL).    Allergies  Allergen Reactions   Nsaids Other (See Comments)    Stomach ulcer    Ketorolac Tromethamine Itching and Nausea And Vomiting    Antimicrobials this admission: 12/30 CTX >> 12/30 Vanc >>  Dose adjustments this admission:   Microbiology results: 12/30 BCx:   Thank you for allowing pharmacy to be a part of this patients care.  Dolly Rias RPh 05/22/2021, 10:37 PM

## 2021-05-22 NOTE — Progress Notes (Signed)
[facility] is following Sepsis bundle   

## 2021-05-22 NOTE — Assessment & Plan Note (Signed)
-  admit per cellulitis protocol will     Vancomycin/rocephin given purulent discharge,   imaging showed:   no evidence of air  no evidence of osteomyelitis  no   foreign   objects     tetanus shot has been updated.     Will obtain MRSA screening,    obtain blood cultures if febrile or septic     further antibiotic adjustment pending above results

## 2021-05-22 NOTE — ED Notes (Signed)
Patient is refusing everything until she receives pain medicine

## 2021-05-22 NOTE — Assessment & Plan Note (Signed)
Transitional care consult 

## 2021-05-22 NOTE — Progress Notes (Signed)
Lower extremity venous RT study completed.  Preliminary results relayed to Eulis Foster, MD.  See CV Proc for preliminary results report.   Darlin Coco, RDMS, RVT

## 2021-05-22 NOTE — ED Provider Notes (Signed)
[facility] DEPT Provider Note   CSN: [id number] Arrival date & time: 05/22/21  1519     History Chief Complaint  Patient presents with   Insect Bite    Terri [name] is a 45 y.o. female.  HPI Patient is a 45 year old female with a history of DVT, hypertension, neuropathy, bipolar disorder, polysubstance abuse, who presents to the emergency department due to pain, swelling, and redness to the right calf.  Patient states that she picks at her skin frequently and has developed cellulitis in the past.  She states about 1 week ago she began developing redness and pain in the right calf which has been progressively worsening.  She reports 2 regions of purulent discharge in the region.  States that the discharge is sometimes white and other times yellow.  Reports exquisite pain in the region.  Patient also complains of nausea and low-grade temperatures with a T-max of 100.1 F.  No vomiting.  Patient states that she snorts both heroin as well as cocaine and denies any history of IVDA.    Past Medical History:  Diagnosis Date   Arthritis    Back and legs   Bipolar disorder (HCC)    Chronic back pain    Depression    DVT (deep venous thrombosis) (HCC)    early 20's leg   GIB (gastrointestinal bleeding) 09/13/2017   History of blood transfusion    Hypertension    Migraine headache    Neuropathy    Pneumonia    Positive PPD    x 2 last time 09/2017   Sciatica    Stress incontinence     Patient Active Problem List   Diagnosis Date Noted   Overdose 04/13/2021   Hypoxemia 04/13/2021   Hypoglycemia 04/13/2021   Asthma exacerbation 04/01/2021   Anemia 04/01/2021   Iron deficiency anemia due to chronic blood loss 03/18/2021   Hyponatremia 03/18/2021   Marginal ulcer 03/15/2021   Microcytic anemia 02/26/2021   Mild protein malnutrition ([place]) 02/26/2021   Polysubstance abuse ([place]) 01/16/2021   Abdominal pain 01/14/2021   Ulcer at site of surgical  anastomosis following bypass of stomach 01/13/2021   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   Persistent fever 02/09/2019   COVID-19 virus not detected    Chest pain    Bacteremia due to Escherichia coli    Esophagitis    Hypokalemia    Hypomagnesemia    Sepsis ([place]) 02/02/2019   Leucocytosis 02/02/2019   Thrombocytosis 02/02/2019   UTI (urinary tract infection) 02/02/2019   Apnea 09/13/2017   Essential hypertension    Cellulitis and abscess of neck 08/12/2017   Cellulitis of submandibular region 08/10/2017   Odontogenic infection of jaw 08/10/2017   Cholecystitis 02/14/2017   Opioid abuse with opioid-induced mood disorder ([place]) 01/25/2017   Anxiety 02/11/2014   Depression 07/12/2012    Past Surgical History:  Procedure Laterality Date   ALVEOLOPLASTY Bilateral 01/31/2018   Procedure: ALVEOLOPLASTY;  Surgeon: Diona Browner, DDS;  [facility];  Service: Oral Surgery;  Laterality: Bilateral;   ANKLE SURGERY Left 1992   with hardware  BACK SURGERY     BIOPSY  02/08/2019   Procedure: BIOPSY;  Surgeon: Rush Landmark Telford Nab., MD;  Location: [facility];  Service: Gastroenterology;;   CESAREAN SECTION     CHOLECYSTECTOMY N/A 02/15/2017   Procedure: LAPAROSCOPIC CHOLECYSTECTOMY;  Surgeon: Clovis Riley, MD;  Location: [facility];  Service: General;  Laterality: N/A;   ENTEROSCOPY N/A 02/08/2019   Procedure: ENTEROSCOPY;  Surgeon: Rush Landmark Telford Nab., MD;  [facility];  Service: Gastroenterology;  Laterality: N/A;   ESOPHAGOGASTRODUODENOSCOPY N/A 01/17/2021   Procedure:  ESOPHAGOGASTRODUODENOSCOPY (EGD);  Surgeon: Juanita Craver, MD;  Location: [facility];  Service: Endoscopy;  Laterality: N/A;   ESOPHAGOGASTRODUODENOSCOPY (EGD) WITH PROPOFOL N/A 09/15/2017   Procedure: ESOPHAGOGASTRODUODENOSCOPY (EGD) WITH PROPOFOL;  Surgeon: Clarene Essex, MD;  [facility];  Service: Endoscopy;  Laterality: N/A;   ESOPHAGOGASTRODUODENOSCOPY (EGD) WITH PROPOFOL N/A 02/08/2019   Procedure: ESOPHAGOGASTRODUODENOSCOPY (EGD) WITH PROPOFOL;  Surgeon: Rush Landmark Telford Nab., MD;  [facility];  Service: Gastroenterology;  Laterality: N/A;   GASTRIC BYPASS     INCISION AND DRAINAGE OF PERITONSILLAR ABCESS Left 08/13/2017   Procedure: INCISION AND DRAINAGE OF LEFT NECK ABSCESS;  Surgeon: Melida Quitter, MD;  [facility];  Service: ENT;  Laterality: Left;   LAPAROSCOPIC LYSIS OF ADHESIONS  02/15/2017   Procedure: LAPAROSCOPIC LYSIS OF ADHESIONS;  Surgeon: Clovis Riley, MD;  [facility];  Service: General;;   LUMBAR DISC SURGERY     LUMBAR FUSION     SAVORY DILATION N/A 02/08/2019   Procedure: SAVORY DILATION;  Surgeon: Irving Copas., MD;  [facility];  Service: Gastroenterology;  Laterality: N/A;   TOOTH EXTRACTION Bilateral 01/31/2018   Procedure: DENTAL RESTORATION/EXTRACTIONS;  Surgeon: Diona Browner, DDS;  Location: [facility];  Service: Oral Surgery;  Laterality: Bilateral;   TUBAL LIGATION     UPPER GI ENDOSCOPY N/A 02/15/2017   Procedure: UPPER GI ENDOSCOPY;  Surgeon: Clovis Riley, MD;  [facility];  Service: General;  Laterality: N/A;     OB History   No obstetric history on file.     Family History  Problem Relation Age of Onset   Diabetes Mother    Hypertension Mother    Diabetes Father    Hypertension Father     Social History   Tobacco Use   Smoking status: Every Day    Packs/day: 1.00    Years: 29.00    Pack years: 29.00    Types: Cigarettes   Smokeless tobacco: Never  Vaping Use   Vaping Use: Never  used  Substance Use Topics   Alcohol use: No   Drug use: Yes    Types: Cocaine    Comment: Patient states she smokes cocaine    Home Medications Prior to Admission medications   Medication Sig Start Date End Date Taking? Authorizing Provider  acetaminophen (TYLENOL) 500 MG tablet Take 1,000 mg by mouth every 6 (six) hours as needed for mild pain. Patient not taking: Reported on 04/21/2021    [provider]  amLODipine (NORVASC) 10 MG tablet Take 1 tablet (10 mg total) by mouth daily. 04/21/21   Tedd Sias, PA  Ferrous Fumarate (IRON) 18 MG TBCR Take 1 tablet (18 mg total) by mouth 2 (two) times daily after a meal. Patient not taking: Reported on 04/18/2021 04/06/21   Domenic Polite, MD  hydrALAZINE (APRESOLINE) 50 MG tablet Take 1 tablet (50 mg total) by mouth 2 (two) times daily. Patient not taking: Reported on 04/18/2021 04/06/21  Domenic Polite, MD  hydrOXYzine (ATARAX/VISTARIL) 25 MG tablet Take 1 tablet (25 mg total) by mouth every 8 (eight) hours as needed for anxiety. Patient not taking: Reported on 04/18/2021 04/06/21   Domenic Polite, MD  lisinopril (ZESTRIL) 10 MG tablet Take 1 tablet (10 mg total) by mouth daily. 04/21/21   Tedd Sias, PA  methocarbamol (ROBAXIN) 750 MG tablet Take 1 tablet (750 mg total) by mouth every 8 (eight) hours as needed for muscle spasms. Patient not taking: Reported on 04/18/2021 04/06/21   Domenic Polite, MD  nicotine (NICODERM CQ - DOSED IN MG/24 HOURS) 21 mg/24hr patch Place 1 patch (21 mg total) onto the skin daily. Patient not taking: Reported on 04/18/2021 04/07/21   Domenic Polite, MD  ondansetron (ZOFRAN-ODT) 4 MG disintegrating tablet Take 1 tablet (4 mg total) by mouth every 8 (eight) hours as needed for nausea or vomiting. 04/21/21   Tedd Sias, PA  pantoprazole (PROTONIX) 40 MG tablet Take 1 tablet (40 mg total) by mouth daily. 03/18/21   Danford, Suann Larry, MD  traMADol (ULTRAM) 50 MG tablet Take 1  tablet (50 mg total) by mouth every 6 (six) hours as needed for moderate pain. Patient not taking: Reported on 04/18/2021 04/06/21   Domenic Polite, MD  vitamin B-12 (CYANOCOBALAMIN) 1000 MCG tablet Take 1 tablet (1,000 mcg total) by mouth daily. Patient not taking: Reported on 04/18/2021 04/06/21   Domenic Polite, MD    Allergies    Nsaids and Ketorolac tromethamine  Review of Systems   Review of Systems  All other systems reviewed and are negative. Ten systems reviewed and are negative for acute change, except as noted in the HPI.   Physical Exam Updated Vital Signs BP 126/90    Pulse (!) 107    Temp 98.9 F (37.2 C) (Oral)    Resp (!) 29    SpO2 96%   Physical Exam Vitals and nursing note reviewed.  Constitutional:      General: She is not in acute distress.    Appearance: She is not ill-appearing, toxic-appearing or diaphoretic.     Comments: Cachectic appearing.  HENT:     Head: Normocephalic and atraumatic.     Right Ear: External ear normal.     Left Ear: External ear normal.     Nose: Nose normal.     Mouth/Throat:     Mouth: Mucous membranes are moist.     Pharynx: Oropharynx is clear. No oropharyngeal exudate or posterior oropharyngeal erythema.  Eyes:     Extraocular Movements: Extraocular movements intact.  Cardiovascular:     Rate and Rhythm: Regular rhythm. Tachycardia present.     Pulses: Normal pulses.     Heart sounds: Normal heart sounds. No murmur heard.   No friction rub. No gallop.  Pulmonary:     Effort: Pulmonary effort is normal. No respiratory distress.     Breath sounds: No stridor. Wheezing present. No rhonchi or rales.  Abdominal:     General: Abdomen is flat.     Palpations: Abdomen is soft.     Tenderness: There is no abdominal tenderness.  Musculoskeletal:        General: Tenderness present. Normal range of motion.     Cervical back: Normal range of motion and neck supple. No tenderness.     Comments: Please see images below of the  right calf.  Exquisite tenderness in the region.  Skin:    General: Skin is warm and dry.  Findings: Erythema present.  Neurological:     General: No focal deficit present.     Mental Status: She is alert and oriented to person, place, and time.  Psychiatric:        Mood and Affect: Mood normal.        Behavior: Behavior normal.      ED Results / Procedures / Treatments   Labs (all labs ordered are listed, but only abnormal results are displayed) Labs Reviewed  RESP PANEL BY RT-PCR (FLU A&B, COVID) ARPGX2 - Abnormal; Notable for the following components:      Result Value   SARS Coronavirus 2 by RT PCR POSITIVE (*)    All other components within normal limits  BASIC METABOLIC PANEL - Abnormal; Notable for the following components:   Sodium 130 (*)    Chloride 95 (*)    Glucose, Bld 138 (*)    Calcium 7.9 (*)    All other components within normal limits  CBC WITH DIFFERENTIAL/PLATELET - Abnormal; Notable for the following components:   WBC 20.6 (*)    Hemoglobin 10.8 (*)    HCT 32.6 (*)    MCV 71.6 (*)    MCH 23.7 (*)    RDW 21.2 (*)    Neutro Abs 19.4 (*)    Lymphs Abs 0.3 (*)    Abs Immature Granulocytes 0.23 (*)    All other components within normal limits  LACTIC ACID, PLASMA - Abnormal; Notable for the following components:   Lactic Acid, Venous 2.4 (*)    All other components within normal limits  LACTIC ACID, PLASMA - Abnormal; Notable for the following components:   Lactic Acid, Venous 2.7 (*)    All other components within normal limits  CULTURE, BLOOD (ROUTINE X 2)  CULTURE, BLOOD (ROUTINE X 2)  PROTIME-INR  APTT  URINALYSIS, ROUTINE W REFLEX MICROSCOPIC  I-STAT BETA HCG BLOOD, ED ([facility], [facility], [facility] ONLY)   EKG None  Radiology DG Tibia/Fibula Right  Result Date: 05/22/2021 CLINICAL DATA:  Right lower extremity wound.  Redness and swelling. EXAM: RIGHT TIBIA AND FIBULA - 2 VIEW COMPARISON:  None. FINDINGS: Soft tissue swelling is noted along the anterior  lower extremity. Soft tissue densities are likely vascular. No underlying osseous abnormality or soft tissue gas present. Knee and ankle joint are unremarkable. IMPRESSION: 1. Soft tissue swelling along the anterior lower extremity without underlying osseous abnormality. Findings consistent with cellulitis. 2. Soft tissue densities are likely vascular. Electronically Signed   By: San Morelle M.D.   On: 05/22/2021 18:45   CT Tibia Fibula Right Wo Contrast  Result Date: 05/22/2021 CLINICAL DATA:  Right leg wound, erythema for 1 week EXAM: CT OF THE LOWER RIGHT EXTREMITY WITHOUT CONTRAST TECHNIQUE: Multidetector CT imaging of the right lower extremity was performed according to the standard protocol. COMPARISON:  05/22/2021 FINDINGS: Bones/Joint/Cartilage There are no acute or destructive bony lesions. Specifically, no evidence of bony destruction or periosteal reaction to suggest osteomyelitis. 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 Mild osteoarthritis within the right hindfoot and midfoot. Ligaments Suboptimally assessed by CT. Muscles and Tendons No acute findings. Soft tissues There is diffuse subcutaneous edema, greatest within the distal right lower leg proximal to the ankle. No evidence of fluid collection or abscess on this unenhanced exam. No subcutaneous gas. Scattered vascular calcifications are noted. Reconstructed images demonstrate no additional findings. IMPRESSION: 1. Marked subcutaneous edema within the distal right lower leg. No evidence of fluid collection or abscess. 2. No acute  or destructive bony lesions. No CT evidence of osteomyelitis. Electronically Signed   By: Randa Ngo M.D.   On: 05/22/2021 20:22   DG Chest Port 1 View  Result Date: 05/22/2021 CLINICAL DATA:  Questionable sepsis. EXAM: PORTABLE CHEST 1 VIEW COMPARISON:  Chest x-ray 04/18/2021. FINDINGS: Lungs are hyperinflated, unchanged. The heart size and mediastinal contours are within normal  limits. Both lungs are clear. The visualized skeletal structures are unremarkable. IMPRESSION: No active disease. Electronically Signed   By: Ronney Asters M.D.   On: 05/22/2021 17:20   VAS [place] LOWER EXTREMITY VENOUS (DVT) (7a-7p)  Result Date: 05/22/2021  Lower Venous DVT Study Patient Name:  JERMIA RIGSBY  Date of Exam:   05/22/2021 Medical Rec #: [id number]      Accession #:    [id number] Date of Birth: [birth year]      Patient Gender: F Patient Age:   57 years Exam Location:  [facility] Procedure:      VAS [place] LOWER EXTREMITY VENOUS (DVT) Referring Phys: Theodis Blaze --------------------------------------------------------------------------------  Indications: Right lower leg wound with drainage and associated warmth, erythema, and swelling. Per PA note, "I do not believe this to be DVT however given the swelling will obtain imaging.". Other Indications: Remote history of DVT 20+ years ago. Comparison Study: No recent prior studies. All studies on file negative for                   DVT. Performing Technologist: Darlin Coco RDMS, RVT  Examination Guidelines: A complete evaluation includes B-mode imaging, spectral Doppler, color Doppler, and power Doppler as needed of all accessible portions of each vessel. Bilateral testing is considered an integral part of a complete examination. Limited examinations for reoccurring indications may be performed as noted. The reflux portion of the exam is performed with the patient in reverse Trendelenburg.  +---------+---------------+---------+-----------+----------+--------------+  RIGHT     Compressibility Phasicity Spontaneity Properties Thrombus Aging  +---------+---------------+---------+-----------+----------+--------------+  CFV       Full            Yes       Yes                                    +---------+---------------+---------+-----------+----------+--------------+  SFJ       Full                                                              +---------+---------------+---------+-----------+----------+--------------+  FV Prox   Full                                                             +---------+---------------+---------+-----------+----------+--------------+  FV Mid    Full                                                             +---------+---------------+---------+-----------+----------+--------------+  FV Distal Full                                                             +---------+---------------+---------+-----------+----------+--------------+  PFV       Full                                                             +---------+---------------+---------+-----------+----------+--------------+  POP       Full            Yes       Yes                                    +---------+---------------+---------+-----------+----------+--------------+  PTV       Full                                                             +---------+---------------+---------+-----------+----------+--------------+  PERO      Full                                                             +---------+---------------+---------+-----------+----------+--------------+   +----+---------------+---------+-----------+----------+--------------+  LEFT Compressibility Phasicity Spontaneity Properties Thrombus Aging  +----+---------------+---------+-----------+----------+--------------+  CFV  Full            Yes       Yes                                    +----+---------------+---------+-----------+----------+--------------+    Summary: RIGHT: - There is no evidence of deep vein thrombosis in the lower extremity.  - No cystic structure found in the popliteal fossa. - Ultrasound characteristics of enlarged lymph nodes are noted in the groin.  LEFT: - No evidence of common femoral vein obstruction.  *See table(s) above for measurements and observations.    Preliminary     Procedures .Critical Care Performed by: Rayna Sexton, PA-C Authorized by: Rayna Sexton,  PA-C   Critical care provider statement:    Critical care time (minutes):  30   Critical care was necessary to treat or prevent imminent or life-threatening deterioration of the following conditions:  Sepsis   Critical care was time spent personally by me on the following activities:  Development of treatment plan with patient or surrogate, discussions with consultants, evaluation of patient's response to treatment, examination of patient, ordering and review of laboratory studies, ordering and review of radiographic studies, ordering and performing treatments and interventions, pulse oximetry, re-evaluation of patient's condition and review of old charts   Medications Ordered in ED Medications  lactated ringers infusion (  Intravenous New Bag/Given 05/22/21 1821)  lactated ringers bolus 1,000 mL (0 mLs Intravenous Stopped 05/22/21 2048)    And  lactated ringers bolus 500 mL (500 mLs Intravenous New Bag/Given 05/22/21 2047)    And  lactated ringers bolus 250 mL (250 mLs Intravenous New Bag/Given 05/22/21 2047)  vancomycin (VANCOCIN) IVPB 1000 mg/200 mL premix (0 mg Intravenous Stopped 05/22/21 2032)  cefTRIAXone (ROCEPHIN) 2 g in sodium chloride 0.9 % 100 mL IVPB (0 g Intravenous Stopped 05/22/21 2033)  morphine 4 MG/ML injection 4 mg (4 mg Intravenous Given 05/22/21 1903)   ED Course  I have reviewed the triage vital signs and the nursing notes.  Pertinent labs & imaging results that were available during my care of the patient were reviewed by me and considered in my medical decision making (see chart for details).    MDM Rules/Calculators/A&P                          Pt is a 45 y.o. female who presents to the emergency department due to what appears to be developing cellulitis to the right leg.  Labs: CBC with a white count of 20.6, hemoglobin of 10.8, MCV of 71.6, RDW 21.2, neutrophils of 9.4, lymphocytes of 0.3, absolute immature granulocytes 0.23. BMP with a sodium of 130,  chloride of 95, glucose of 138, calcium of 7.9. PT/INR within normal limits. APTT within normal limits. I-STAT beta-hCG less than 5. Respiratory panel is positive for COVID-19. Blood cultures obtained. Lactic acid 2.7. UA is pending.  Imaging: Vascular ultrasound of the right lower extremity shows no evidence of deep vein thrombosis in the lower extremity.  No cystic structure found on the popliteal fossa.  In the left leg there is no evidence of common femoral vein obstruction. Chest x-ray shows no active disease. X-ray of the right tibia/fibula shows soft tissue swelling along the anterior lower extremity without underlying osseous abnormality.  Findings consistent with cellulitis.  Soft tissues densities are likely vascular. CT scan of the right tibia/fibula shows marked subcutaneous edema within the distal right lower leg.  No evidence of fluid collection or abscess.  No acute or destructive bony lesions.  No CT evidence of osteomyelitis.  I, Rayna Sexton, PA-C, personally reviewed and evaluated these images and lab results as part of my medical decision-making.  Patient with a history of polysubstance abuse but denies any history whatsoever of IVDA.  She does note that she "picks at her skin" regularly.  She states that the region on her leg started as a small scratch and has progressively worsened with persistent purulent discharge central to the cellulitis.  CBC significant for a leukocytosis of 20.6.  Patient hyponatremic at 130 and hypochloremic at 95.  Respiratory panel incidentally notes patient is positive for COVID-19.  Based on patient's white blood cell count and tachycardia, broad-spectrum antibiotics as well as IV fluid bolus were initiated.  Feel that patient will require admission for antibiotics and further management.  This was discussed with the patient and she is amenable.  We will discuss with the medicine team.  Note: Portions of this report may have been  transcribed using voice recognition software. Every effort was made to ensure accuracy; however, inadvertent computerized transcription errors may be present.   Final Clinical Impression(s) / ED Diagnoses Final diagnoses:  Cellulitis of right lower extremity   Rx / DC Orders ED Discharge Orders     None  Rayna Sexton, PA-C 05/22/21 2059    Daleen Bo, MD 05/23/21 [phone]

## 2021-05-22 NOTE — Assessment & Plan Note (Signed)
Rehydrate obtain urine electrolytes

## 2021-05-22 NOTE — ED Provider Notes (Signed)
Emergency Medicine Provider Triage Evaluation Note  Terri [name] , a 45 y.o. female  was evaluated in triage.  Pt complains of wound to the right lower leg.  Patient states that for 1 week she has noticed progressively worsening redness and pain to the right lower extremity.  She states that initially started out as a small area however has progressed with redness, pain, warmth and now drainage from the area.  She does note low-grade fever yesterday at home.  She states that she has been trying over-the-counter medication without relief of symptoms.  She denies IV drug use.  She states that she does pick her skin from anxiety but denies picking at this wound.  Was a note in the chart previously about possible insect bite however she denies ever seeing an insect or noticing a bite..  Review of Systems  Positive: See above Negative:   Physical Exam  BP 123/80 (BP Location: Right Arm)    Pulse (!) 110    Temp 98.9 F (37.2 C) (Oral)    Resp 18    SpO2 100%  Gen:   Awake, no distress   Resp:  Normal effort  MSK:   Right lower leg with redness, warmth and swelling.  There is obvious wound to the posterior right lower leg with serosanguineous drainage coming from it.  There does seem to be 2 central areas of purulence.  Patient barely tolerates palpation to the area. Other:    Medical Decision Making  Medically screening exam initiated at 4:05 PM.  Appropriate orders placed.  Terri [name] was informed that the remainder of the evaluation will be completed by another provider, this initial triage assessment does not replace that evaluation, and the importance of remaining in the ED until their evaluation is complete.  Who presents to the clinic, ultrasound lower extremity.  She does have a history of DVT.  I do not believe this to be DVT however given the swelling will obtain imaging.   Terri Hillier, PA-C 05/22/21 1607    Terri Merino, MD 05/22/21 [phone]

## 2021-05-22 NOTE — H&P (Signed)
Terri [name] JJK:[id number] DOB: September 02, 1975 DOA: 05/22/2021     PCP: Merryl Hacker, No   Outpatient Specialists:  NONE    Patient arrived to ER on 05/22/21 at 1519 Referred by Attending Daleen Bo, MD   Patient coming from: home Lives alone,     With family    Chief Complaint:   Chief Complaint  Patient presents with   Insect Bite    HPI: Terri [name] is a 45 y.o. female with medical history significant of polysubstance abuse    Presented with   right leg swelling she has been picking at her wounds. Denies any fevers or chills but have had a bit of nausea.  Significant right leg pain and drainage and some pus.  Denies any cough. History of polysubstance abuse including cocaine.  Denies alcohol abuse.  She continues to smoke. No sick contacts   Initial COVID TEST    POSITIVE,     Lab Results  Component Value Date   SARSCOV2NAA POSITIVE (A) 05/22/2021   [place] NEGATIVE 04/18/2021   [place] NEGATIVE 04/13/2021   [place] NEGATIVE 04/01/2021     Regarding pertinent Chronic problems:     Asthma -well   controlled on home inhalers/    Chronic anemia - baseline hg Hemoglobin & Hematocrit  Recent Labs    04/18/21 0759 04/21/21 0755 05/22/21 1610  HGB 9.7* 11.7* 10.8*     While in ER:   Found to have cellulitis plain imaging negative for foreign body or abscess.  Treated with Rocephin and vancomycin.  CT showed no evidence of abscess  Ordered CT right lower extremity Marked subcutaneous edema within the distal right lower leg. No evidence of fluid collection or abscess. 2. No acute or destructive bony lesions. No CT evidence of osteomyelitis. UA positive for UTI Following Medications were ordered in ER: Medications  lactated ringers infusion ( Intravenous New Bag/Given 05/22/21 1821)  lactated ringers bolus 1,000 mL (0 mLs Intravenous Stopped 05/22/21 2048)    And  lactated ringers bolus 500 mL (500 mLs Intravenous New Bag/Given 05/22/21 2047)     And  lactated ringers bolus 250 mL (250 mLs Intravenous New Bag/Given 05/22/21 2047)  vancomycin (VANCOCIN) IVPB 1000 mg/200 mL premix (0 mg Intravenous Stopped 05/22/21 2032)  cefTRIAXone (ROCEPHIN) 2 g in sodium chloride 0.9 % 100 mL IVPB (0 g Intravenous Stopped 05/22/21 2033)  morphine 4 MG/ML injection 4 mg (4 mg Intravenous Given 05/22/21 1903)     ED Triage Vitals  Enc Vitals Group     BP 05/22/21 1554 123/80     Pulse Rate 05/22/21 1554 (!) 110     Resp 05/22/21 1554 18     Temp 05/22/21 1554 98.9 F (37.2 C)     Temp Source 05/22/21 1554 Oral     SpO2 05/22/21 1554 100 %     Weight --      Height --      Head Circumference --      Peak Flow --      Pain Score 05/22/21 1557 10     Pain Loc --      Pain Edu? --      Excl. in [place]? --   TMAX(24)@     _________________________________________ Significant initial  Findings: Abnormal Labs Reviewed  RESP PANEL BY RT-PCR (FLU A&B, COVID) ARPGX2 - Abnormal; Notable for the following components:      Result Value   SARS Coronavirus 2 by RT PCR POSITIVE (*)  All other components within normal limits  BASIC METABOLIC PANEL - Abnormal; Notable for the following components:   Sodium 130 (*)    Chloride 95 (*)    Glucose, Bld 138 (*)    Calcium 7.9 (*)    All other components within normal limits  CBC WITH DIFFERENTIAL/PLATELET - Abnormal; Notable for the following components:   WBC 20.6 (*)    Hemoglobin 10.8 (*)    HCT 32.6 (*)    MCV 71.6 (*)    MCH 23.7 (*)    RDW 21.2 (*)    Neutro Abs 19.4 (*)    Lymphs Abs 0.3 (*)    Abs Immature Granulocytes 0.23 (*)    All other components within normal limits  LACTIC ACID, PLASMA - Abnormal; Notable for the following components:   Lactic Acid, Venous 2.4 (*)    All other components within normal limits  LACTIC ACID, PLASMA - Abnormal; Notable for the following components:   Lactic Acid, Venous 2.7 (*)    All other components within normal limits     ECG:  Ordered Personally reviewed by me showing: HR : 103 Rhythm: Sinus tachycardia    no evidence of ischemic changes QTC 445   ____________________ This patient meets SIRS Criteria and may be septic.  The recent clinical data is shown below. Vitals:   05/22/21 1900 05/22/21 1930 05/22/21 2030 05/22/21 2100  BP: 131/80 131/81 126/90 (!) 163/100  Pulse: (!) 101 97 (!) 107 96  Resp:  19 (!) 29 18  Temp:      TempSrc:      SpO2: 100% 98% 96% 98%    WBC     Component Value Date/Time   WBC 20.6 (H) 05/22/2021 1610   LYMPHSABS 0.3 (L) 05/22/2021 1610   MONOABS 0.6 05/22/2021 1610   EOSABS 0.0 05/22/2021 1610   BASOSABS 0.1 05/22/2021 1610    Lactic Acid, Venous    Component Value Date/Time   LATICACIDVEN 2.7 (HH) 05/22/2021 1857         UA  evidence of UTI      Urine analysis:    Component Value Date/Time   COLORURINE YELLOW 04/13/2021 0400   APPEARANCEUR HAZY (A) 04/13/2021 0400   LABSPEC 1.010 04/13/2021 0400   PHURINE 5.0 04/13/2021 0400   GLUCOSEU NEGATIVE 04/13/2021 0400   HGBUR SMALL (A) 04/13/2021 0400   BILIRUBINUR NEGATIVE 04/13/2021 0400   KETONESUR NEGATIVE 04/13/2021 0400   PROTEINUR NEGATIVE 04/13/2021 0400   UROBILINOGEN 1.0 02/09/2015 1228   NITRITE NEGATIVE 04/13/2021 0400   LEUKOCYTESUR NEGATIVE 04/13/2021 0400    Results for orders placed or performed during the hospital encounter of 05/22/21  Resp Panel by RT-PCR (Flu A&B, Covid) Nasopharyngeal Swab     Status: Abnormal   Collection Time: 05/22/21  5:07 PM   Specimen: Nasopharyngeal Swab; Nasopharyngeal(NP) swabs in vial transport medium  Result Value Ref Range Status   SARS Coronavirus 2 by RT PCR POSITIVE (A) NEGATIVE Final         Influenza A by PCR NEGATIVE NEGATIVE Final   Influenza B by PCR NEGATIVE NEGATIVE Final           _______________________________________________ Hospitalist was called for admission for right lower extremity cellulitis and incidental COVID  The following  Work up has been ordered so far:  Orders Placed This Encounter  Procedures   Critical Care   Resp Panel by RT-PCR (Flu A&B, Covid) Nasopharyngeal Swab   Blood Culture (routine x 2)  DG Chest Port 1 View   DG Tibia/Fibula Right   CT Tibia Fibula Right Wo Contrast   Basic metabolic panel   CBC with Differential   Lactic acid, plasma   Protime-INR   APTT   Urinalysis, Routine w reflex microscopic   Diet NPO time specified   Cardiac monitoring   Document height and weight   Assess and Document Glasgow Coma Scale   Document vital signs within 1-hour of fluid bolus completion. Notify provider of abnormal vital signs despite fluid resuscitation.   DO NOT delay antibiotics if unable to obtain blood culture.   Refer to Sidebar Report: Sepsis Sidebar ED/IP   Notify provider for difficulties obtaining IV access.   Insert peripheral IV x 2   Initiate Carrier Fluid Protocol   Code Sepsis activation.  This occurs automatically when order is signed and prioritizes pharmacy, lab, and radiology services for STAT collections and interventions.  If [facility] downtime, call [facility] [phone]) to activate Code Sepsis.   pharmacy consult   Consult to hospitalist   Pulse oximetry, continuous   I-Stat beta hCG blood, ED   ED EKG 12-Lead   VAS [place] LOWER EXTREMITY VENOUS (DVT) (7a-7p)     OTHER Significant initial  Findings:  labs showing:    Recent Labs  Lab 05/22/21 1610  NA 130*  K 3.5  CO2 26  GLUCOSE 138*  BUN 17  CREATININE 0.76  CALCIUM 7.9*    Cr    stable,    Lab Results  Component Value Date   CREATININE 0.76 05/22/2021   CREATININE 0.80 04/21/2021   CREATININE 1.08 (H) 04/18/2021    No results for input(s): AST, ALT, ALKPHOS, BILITOT, PROT, ALBUMIN in the last 168 hours. Lab Results  Component Value Date   CALCIUM 7.9 (L) 05/22/2021   PHOS 3.0 04/02/2021          Plt: Lab Results  Component Value Date   PLT 274 05/22/2021      COVID-19 Labs  No results for  input(s): DDIMER, FERRITIN, LDH, CRP in the last 72 hours.  Lab Results  Component Value Date   SARSCOV2NAA POSITIVE (A) 05/22/2021   SARSCOV2NAA NEGATIVE 04/18/2021   SARSCOV2NAA NEGATIVE 04/13/2021   [place] NEGATIVE 04/01/2021        Recent Labs  Lab 05/22/21 1610  WBC 20.6*  NEUTROABS 19.4*  HGB 10.8*  HCT 32.6*  MCV 71.6*  PLT 274    HG/HCT  stable,     Component Value Date/Time   HGB 10.8 (L) 05/22/2021 1610   HCT 32.6 (L) 05/22/2021 1610   MCV 71.6 (L) 05/22/2021 1610           Cultures:    Component Value Date/Time   SDES  04/13/2021 0224    BLOOD RIGHT FOREARM Performed at [facility], [place] [address] [place], [place] [address]    [place]  04/13/2021 0224    BOTTLES DRAWN AEROBIC AND ANAEROBIC Blood Culture adequate volume Performed at [facility], [place] [address] [place], [place] [address]    CULT  04/13/2021 0224    NO GROWTH 5 DAYS Performed at [facility], [place] [address], [place], [place] [address]    REPTSTATUS 04/18/2021 FINAL 04/13/2021 0224     Radiological Exams on Admission: DG Tibia/Fibula Right  Result Date: 05/22/2021 CLINICAL DATA:  Right lower extremity wound.  Redness and swelling. EXAM: RIGHT TIBIA AND FIBULA - 2 VIEW COMPARISON:  None. FINDINGS: Soft tissue swelling  is noted along the anterior lower extremity. Soft tissue densities are likely vascular. No underlying osseous abnormality or soft tissue gas present. Knee and ankle joint are unremarkable. IMPRESSION: 1. Soft tissue swelling along the anterior lower extremity without underlying osseous abnormality. Findings consistent with cellulitis. 2. Soft tissue densities are likely vascular. Electronically Signed   By: San Morelle M.D.   On: 05/22/2021 18:45   CT Tibia Fibula Right Wo Contrast  Result Date: 05/22/2021 CLINICAL DATA:  Right leg wound, erythema for 1 week EXAM: CT OF THE LOWER RIGHT EXTREMITY WITHOUT CONTRAST  TECHNIQUE: Multidetector CT imaging of the right lower extremity was performed according to the standard protocol. COMPARISON:  05/22/2021 FINDINGS: Bones/Joint/Cartilage There are no acute or destructive bony lesions. Specifically, no evidence of bony destruction or periosteal reaction to suggest osteomyelitis. 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 Mild osteoarthritis within the right hindfoot and midfoot. Ligaments Suboptimally assessed by CT. Muscles and Tendons No acute findings. Soft tissues There is diffuse subcutaneous edema, greatest within the distal right lower leg proximal to the ankle. No evidence of fluid collection or abscess on this unenhanced exam. No subcutaneous gas. Scattered vascular calcifications are noted. Reconstructed images demonstrate no additional findings. IMPRESSION: 1. Marked subcutaneous edema within the distal right lower leg. No evidence of fluid collection or abscess. 2. No acute or destructive bony lesions. No CT evidence of osteomyelitis. Electronically Signed   By: Randa Ngo M.D.   On: 05/22/2021 20:22   DG Chest Port 1 View  Result Date: 05/22/2021 CLINICAL DATA:  Questionable sepsis. EXAM: PORTABLE CHEST 1 VIEW COMPARISON:  Chest x-ray 04/18/2021. FINDINGS: Lungs are hyperinflated, unchanged. The heart size and mediastinal contours are within normal limits. Both lungs are clear. The visualized skeletal structures are unremarkable. IMPRESSION: No active disease. Electronically Signed   By: Ronney Asters M.D.   On: 05/22/2021 17:20   VAS [place] LOWER EXTREMITY VENOUS (DVT) (7a-7p)  Result Date: 05/22/2021  Lower Venous DVT Study Patient Name:  CORBYN STEEDMAN  Date of Exam:   05/22/2021 Medical Rec #: [id number]      Accession #:    [id number] Date of Birth: Nov 11, 1975      Patient Gender: F Patient Age:   62 years Exam Location:  [facility] Procedure:      VAS [place] LOWER EXTREMITY VENOUS (DVT) Referring Phys: Theodis Blaze  --------------------------------------------------------------------------------  Indications: Right lower leg wound with drainage and associated warmth, erythema, and swelling. Per PA note, "I do not believe this to be DVT however given the swelling will obtain imaging.". Other Indications: Remote history of DVT 20+ years ago. Comparison Study: No recent prior studies. All studies on file negative for                   DVT. Performing Technologist: Darlin Coco RDMS, RVT  Examination Guidelines: A complete evaluation includes B-mode imaging, spectral Doppler, color Doppler, and power Doppler as needed of all accessible portions of each vessel. Bilateral testing is considered an integral part of a complete examination. Limited examinations for reoccurring indications may be performed as noted. The reflux portion of the exam is performed with the patient in reverse Trendelenburg.  +---------+---------------+---------+-----------+----------+--------------+  RIGHT     Compressibility Phasicity Spontaneity Properties Thrombus Aging  +---------+---------------+---------+-----------+----------+--------------+  CFV       Full            Yes       Yes                                    +---------+---------------+---------+-----------+----------+--------------+  SFJ       Full                                                             +---------+---------------+---------+-----------+----------+--------------+  FV Prox   Full                                                             +---------+---------------+---------+-----------+----------+--------------+  FV Mid    Full                                                             +---------+---------------+---------+-----------+----------+--------------+  FV Distal Full                                                             +---------+---------------+---------+-----------+----------+--------------+  PFV       Full                                                              +---------+---------------+---------+-----------+----------+--------------+  POP       Full            Yes       Yes                                    +---------+---------------+---------+-----------+----------+--------------+  PTV       Full                                                             +---------+---------------+---------+-----------+----------+--------------+  PERO      Full                                                             +---------+---------------+---------+-----------+----------+--------------+   +----+---------------+---------+-----------+----------+--------------+  LEFT Compressibility Phasicity Spontaneity Properties Thrombus Aging  +----+---------------+---------+-----------+----------+--------------+  CFV  Full            Yes       Yes                                    +----+---------------+---------+-----------+----------+--------------+  Summary: RIGHT: - There is no evidence of deep vein thrombosis in the lower extremity.  - No cystic structure found in the popliteal fossa. - Ultrasound characteristics of enlarged lymph nodes are noted in the groin.  LEFT: - No evidence of common femoral vein obstruction.  *See table(s) above for measurements and observations.    Preliminary    _______________________________________________________________________________________________________ Latest  Blood pressure (!) 163/100, pulse 96, temperature 98.9 F (37.2 C), temperature source Oral, resp. rate 18, SpO2 98 %.   Vitals  labs and radiology finding personally reviewed  Review of Systems:    Pertinent positives include:  nausea,  Constitutional:  No weight loss, night sweats, Fevers, chills, fatigue, weight loss  HEENT:  No headaches, Difficulty swallowing,Tooth/dental problems,Sore throat,  No sneezing, itching, ear ache, nasal congestion, post nasal drip,  Cardio-vascular:  No chest pain, Orthopnea, PND, anasarca, dizziness, palpitations.no Bilateral lower  extremity swelling  GI:  No heartburn, indigestion, abdominal pain,  vomiting, diarrhea, change in bowel habits, loss of appetite, melena, blood in stool, hematemesis Resp:  no shortness of breath at rest. No dyspnea on exertion, No excess mucus, no productive cough, No non-productive cough, No coughing up of blood.No change in color of mucus.No wheezing. Skin:  no rash or lesions. No jaundice GU:  no dysuria, change in color of urine, no urgency or frequency. No straining to urinate.  No flank pain.  Musculoskeletal:  No joint pain or no joint swelling. No decreased range of motion. No back pain.  Psych:  No change in mood or affect. No depression or anxiety. No memory loss.  Neuro: no localizing neurological complaints, no tingling, no weakness, no double vision, no gait abnormality, no slurred speech, no confusion  All systems reviewed and apart from [place] all are negative _______________________________________________________________________________________________ Past Medical History:   Past Medical History:  Diagnosis Date   Arthritis    Back and legs   Bipolar disorder (HCC)    Chronic back pain    Depression    DVT (deep venous thrombosis) (HCC)    early 20's leg   GIB (gastrointestinal bleeding) 09/13/2017   History of blood transfusion    Hypertension    Migraine headache    Neuropathy    Pneumonia    Positive PPD    x 2 last time 09/2017   Sciatica    Stress incontinence       Past Surgical History:  Procedure Laterality Date   ALVEOLOPLASTY Bilateral 01/31/2018   Procedure: ALVEOLOPLASTY;  Surgeon: Diona Browner, DDS;  Location: [facility];  Service: Oral Surgery;  Laterality: Bilateral;   ANKLE SURGERY Left 1992   with hardware   BACK SURGERY     BIOPSY  02/08/2019   Procedure: BIOPSY;  Surgeon: Rush Landmark Telford Nab., MD;  Location: [facility];  Service: Gastroenterology;;   CESAREAN SECTION     CHOLECYSTECTOMY N/A 02/15/2017   Procedure: LAPAROSCOPIC  CHOLECYSTECTOMY;  Surgeon: Clovis Riley, MD;  Location: [facility];  Service: General;  Laterality: N/A;   ENTEROSCOPY N/A 02/08/2019   Procedure: ENTEROSCOPY;  Surgeon: Rush Landmark Telford Nab., MD;  Location[facility];  Service: Gastroenterology;  Laterality: N/A;   ESOPHAGOGASTRODUODENOSCOPY N/A 01/17/2021   Procedure: ESOPHAGOGASTRODUODENOSCOPY (EGD);  Surgeon: Juanita Craver, MD;  Location: [facility];  Service: Endoscopy;  Laterality: N/A;   ESOPHAGOGASTRODUODENOSCOPY (EGD) WITH PROPOFOL N/A 09/15/2017   Procedure: ESOPHAGOGASTRODUODENOSCOPY (EGD) WITH PROPOFOL;  Surgeon: Clarene Essex, MD;  [facility];  Service: Endoscopy;  Laterality: N/A;   ESOPHAGOGASTRODUODENOSCOPY (EGD) WITH  PROPOFOL N/A 02/08/2019   Procedure: ESOPHAGOGASTRODUODENOSCOPY (EGD) WITH PROPOFOL;  Surgeon: Rush Landmark Telford Nab., MD;  Location: [facility];  Service: Gastroenterology;  Laterality: N/A;   GASTRIC BYPASS     INCISION AND DRAINAGE OF PERITONSILLAR ABCESS Left 08/13/2017   Procedure: INCISION AND DRAINAGE OF LEFT NECK ABSCESS;  Surgeon: Melida Quitter, MD;  Location: [facility];  Service: ENT;  Laterality: Left;   LAPAROSCOPIC LYSIS OF ADHESIONS  02/15/2017   Procedure: LAPAROSCOPIC LYSIS OF ADHESIONS;  Surgeon: Clovis Riley, MD;  Location: [facility];  Service: General;;   LUMBAR DISC SURGERY     LUMBAR FUSION     SAVORY DILATION N/A 02/08/2019   Procedure: SAVORY DILATION;  Surgeon: Irving Copas., MD;  Location[facility];  Service: Gastroenterology;  Laterality: N/A;   TOOTH EXTRACTION Bilateral 01/31/2018   Procedure: DENTAL RESTORATION/EXTRACTIONS;  Surgeon: Diona Browner, DDS;  Location: [facility];  Service: Oral Surgery;  Laterality: Bilateral;   TUBAL LIGATION     UPPER GI ENDOSCOPY N/A 02/15/2017   Procedure: UPPER GI ENDOSCOPY;  Surgeon: Clovis Riley, MD;  Location: [facility];  Service: General;  Laterality: N/A;    Social History:  Ambulatory   independently      reports that  she has been smoking cigarettes. She has a 29.00 pack-year smoking history. She has never used smokeless tobacco. She reports current drug use. Drug: Cocaine. She reports that she does not drink alcohol.     Family History:   Family History  Problem Relation Age of Onset   Diabetes Mother    Hypertension Mother    Diabetes Father    Hypertension Father    ______________________________________________________________________________________________ Allergies: Allergies  Allergen Reactions   Nsaids Other (See Comments)    Stomach ulcer    Ketorolac Tromethamine Itching and Nausea And Vomiting     Prior to Admission medications   Medication Sig Start Date End Date Taking? Authorizing Provider  acetaminophen (TYLENOL) 500 MG tablet Take 1,000 mg by mouth every 6 (six) hours as needed for mild pain. Patient not taking: Reported on 04/21/2021    [provider]  amLODipine (NORVASC) 10 MG tablet Take 1 tablet (10 mg total) by mouth daily. 04/21/21   Tedd Sias, PA  Ferrous Fumarate (IRON) 18 MG TBCR Take 1 tablet (18 mg total) by mouth 2 (two) times daily after a meal. Patient not taking: Reported on 04/18/2021 04/06/21   Domenic Polite, MD  hydrALAZINE (APRESOLINE) 50 MG tablet Take 1 tablet (50 mg total) by mouth 2 (two) times daily. Patient not taking: Reported on 04/18/2021 04/06/21   Domenic Polite, MD  hydrOXYzine (ATARAX/VISTARIL) 25 MG tablet Take 1 tablet (25 mg total) by mouth every 8 (eight) hours as needed for anxiety. Patient not taking: Reported on 04/18/2021 04/06/21   Domenic Polite, MD  lisinopril (ZESTRIL) 10 MG tablet Take 1 tablet (10 mg total) by mouth daily. 04/21/21   Tedd Sias, PA  methocarbamol (ROBAXIN) 750 MG tablet Take 1 tablet (750 mg total) by mouth every 8 (eight) hours as needed for muscle spasms. Patient not taking: Reported on 04/18/2021 04/06/21   Domenic Polite, MD  nicotine (NICODERM CQ - DOSED IN MG/24 HOURS) 21 mg/24hr  patch Place 1 patch (21 mg total) onto the skin daily. Patient not taking: Reported on 04/18/2021 04/07/21   Domenic Polite, MD  ondansetron (ZOFRAN-ODT) 4 MG disintegrating tablet Take 1 tablet (4 mg total) by mouth every 8 (eight) hours as needed for nausea or  vomiting. 04/21/21   Tedd Sias, PA  pantoprazole (PROTONIX) 40 MG tablet Take 1 tablet (40 mg total) by mouth daily. 03/18/21   Danford, Suann Larry, MD  traMADol (ULTRAM) 50 MG tablet Take 1 tablet (50 mg total) by mouth every 6 (six) hours as needed for moderate pain. Patient not taking: Reported on 04/18/2021 04/06/21   Domenic Polite, MD  vitamin B-12 (CYANOCOBALAMIN) 1000 MCG tablet Take 1 tablet (1,000 mcg total) by mouth daily. Patient not taking: Reported on 04/18/2021 04/06/21   Domenic Polite, MD    ___________________________________________________________________________________________________ Physical Exam: Vitals with BMI 05/22/2021 05/22/2021 05/22/2021  Height - - -  Weight - - -  BMI - - -  Systolic 881 103 159  Diastolic 458 90 81  Pulse 96 107 97     1. General:  in No  Acute distress    Chronically ill cachectic  -appearing 2. Psychological: Alert and   Oriented 3. Head/ENT:   Dry Mucous Membranes                          Head Non traumatic, neck supple                           Poor Dentition 4. SKIN: decreased Skin turgor,  Skin clean Dry and intact no rash right leg swelling redness 5. Heart: Regular rate and rhythm no  Murmur, no Rub or gallop 6. Lungs:  no wheezes or crackles   7. Abdomen: Soft,  non-tender, Non distended  bowel sounds present 8. Lower extremities: no clubbing, cyanosis, no  edema 9. Neurologically Grossly intact, moving all 4 extremities equally   10. MSK: Normal range of motion    Chart has been reviewed  ______________________________________________________________________________________________  Assessment/Plan 45 y.o. female with medical history  significant of polysubstance abuse    Admitted for celulitis and incidental covid  Present on Admission:  Opioid abuse with opioid-induced mood disorder ([place])  Essential hypertension  Cellulitis  Sepsis ([place])  Tobacco abuse  Polysubstance abuse ([place])  COVID-19 virus infection  Hyponatremia     Cellulitis -admit per  cellulitis protocol will     Vancomycin/rocephin given purulent discharge,   imaging showed:   no evidence of air  no evidence of osteomyelitis  no   foreign   objects     tetanus shot has been updated.     Will obtain MRSA screening,    obtain blood cultures  if febrile or septic     further antibiotic adjustment pending above results   Opioid abuse with opioid-induced mood disorder ([place]) treat for withdrawal  Essential hypertension Allow permissive htn  Sepsis ([place])  -SIRS criteria met with  elevated white blood cell count,       Component Value Date/Time   WBC 20.6 (H) 05/22/2021 1610   LYMPHSABS 0.3 (L) 05/22/2021 1610     tachycardia   ,  RR >20 Today's Vitals   05/22/21 1930 05/22/21 1956 05/22/21 2030 05/22/21 2100  BP: 131/81  126/90 (!) 163/100  Pulse: 97  (!) 107 96  Resp: 19  (!) 29 18  Temp:      TempSrc:      SpO2: 98%  96% 98%  PainSc:  7         The recent clinical data is shown below. Vitals:   05/22/21 1900 05/22/21 1930 05/22/21 2030 05/22/21 2100  BP: 131/80 131/81 126/90 Marland Kitchen)  163/100  Pulse: (!) 101 97 (!) 107 96  Resp:  19 (!) 29 18  Temp:      TempSrc:      SpO2: 100% 98% 96% 98%       -Most likely source being  Cellulitis,      Patient meeting criteria for Severe sepsis with    evidence of end organ damage/organ dysfunction such as       Component Value Date/Time   LATICACIDVEN 2.7 ([place]) 05/22/2021 1857      elevated lactic acid >2    - Obtain serial lactic acid and procalcitonin level.  - Initiated IV antibiotics in ER: Antibiotics Given (last 72 hours)     Date/Time Action Medication Dose Rate    05/22/21 1819 New Bag/Given   cefTRIAXone (ROCEPHIN) 2 g in sodium chloride 0.9 % 100 mL IVPB 2 g 200 mL/hr   05/22/21 1823 New Bag/Given   vancomycin (VANCOCIN) IVPB 1000 mg/200 mL premix 1,000 mg 200 mL/hr       Will continue     - await results of blood and urine culture  - Rehydrate aggressively  Intravenous fluids were administered    30cc/kg fluid    9:17 PM   Polysubstance abuse (HCC) Transitional care consult  Hyponatremia Rehydrate obtain urine electrolytes  Tobacco abuse  - Spoke about importance of quitting spent 5 minutes discussing options for treatment, prior attempts at quitting, and dangers of smoking  -At this point patient is  NOT  interested in quitting    - nursing tobacco cessation protocol   COVID-19 virus infection COVID infection   So far   asymptomatic  CXR with no infiltrates Hold off on antivirals for tonight Hold off on steroids no  infiltrates or  pulmonary complaints, no  hypoxia No indication for immunomodulators Obtain inflammatory markers Pronate as needed   Supportive measures Avoid over aggressive fluid resuscitation Airborne precautions     Other plan as per orders.  DVT prophylaxis:  SCD      Code Status:    Code Status: Prior FULL CODE  as per patient   I had personally discussed CODE STATUS with patient     Family Communication:   Family   at  Bedside  plan of care was discussed   with Husband,   Disposition Plan:    To home once workup is complete and patient is stable   Following barriers for discharge:                            Electrolytes corrected                                                        Pain controlled with PO medications                               Afebrile, white count improving able to transition to PO antibiotics                       Transition of care consulted                    Consults called: none   Admission status:  ED Disposition     ED Disposition  Admit    Condition  --   Comment  Hospital Area: [facility] [[id number]]  Level of Care: Telemetry [5]  Admit to tele based on following criteria: Other see comments  Comments: sepsis  May admit patient to [facility] or [facility] if equivalent level of care is available:: No  Covid Evaluation: Confirmed COVID Positive  Diagnosis: Cellulitis [[id number]]  Admitting Physician: Toy Baker [[id number]]  Attending Physician: Toy Baker [[id number]]  Estimated length of stay: past midnight tomorrow  Certification:: I certify this patient will need inpatient services for at least 2 midnights            inpatient     I Expect 2 midnight stay secondary to severity of patient's current illness need for inpatient interventions justified by the following:  hemodynamic instability despite optimal treatment (tachycardia  )  Severe lab/radiological/exam abnormalities including:    cellulitis and extensive comorbidities including:  substance abuse     That are currently affecting medical management.   I expect  patient to be hospitalized for 2 midnights requiring inpatient medical care.  Patient is at high risk for adverse outcome (such as loss of life or disability) if not treated.  Indication for inpatient stay as follows:    severe pain requiring acute inpatient management,  inability to maintain oral hydration      Need for IV antibiotics, IV fluids,    Level of care     tele  For 12H     Lab Results  Component Value Date   Cisco (A) 05/22/2021     Precautions: admitted as  Airborne and Contact precautions    Yoshino Broccoli 05/23/2021, 1:24 AM    [facility]     after 2 AM please page floor coverage PA If 7AM-7PM, please contact the day team taking care of the patient using [email or web address]   Patient was evaluated in the context of the global COVID-19 pandemic, which necessitated consideration that the patient might be at risk for  infection with the SARS-CoV-2 virus that causes COVID-19. Institutional protocols and algorithms that pertain to the evaluation of patients at risk for COVID-19 are in a state of rapid change based on information released by regulatory bodies including the CDC and federal and state organizations. These policies and algorithms were followed during the patient's care.

## 2021-05-23 ENCOUNTER — Other Ambulatory Visit: Payer: Self-pay

## 2021-05-23 LAB — BASIC METABOLIC PANEL
Anion gap: 6 (ref 5–15)
BUN: 17 mg/dL (ref 6–20)
CO2: 28 mmol/L (ref 22–32)
Calcium: 7.9 mg/dL — ABNORMAL LOW (ref 8.9–10.3)
Chloride: 96 mmol/L — ABNORMAL LOW (ref 98–111)
Creatinine, Ser: 0.61 mg/dL (ref 0.44–1.00)
GFR, Estimated: >60 mL/min (ref >60)
Glucose, Bld: 87 mg/dL (ref 70–99)
Potassium: 3.2 mmol/L — ABNORMAL LOW (ref 3.5–5.1)
Sodium: 130 mmol/L — ABNORMAL LOW (ref 135–145)

## 2021-05-23 LAB — URINALYSIS, COMPLETE (UACMP) WITH MICROSCOPIC
Bilirubin Urine: NEGATIVE
Glucose, UA: NEGATIVE mg/dL
Ketones, ur: NEGATIVE mg/dL
Nitrite: NEGATIVE
Protein, ur: NEGATIVE mg/dL
Specific Gravity, Urine: 1.009 (ref 1.005–1.030)
pH: 6 (ref 5.0–8.0)

## 2021-05-23 LAB — SODIUM, URINE, RANDOM: Sodium, Ur: 14 mmol/L

## 2021-05-23 LAB — FIBRINOGEN: Fibrinogen: 651 mg/dL — ABNORMAL HIGH (ref 210–475)

## 2021-05-23 LAB — CBC WITH DIFFERENTIAL/PLATELET
Abs Immature Granulocytes: 0.37 10*3/uL — ABNORMAL HIGH (ref 0.00–0.07)
Basophils Absolute: 0.1 10*3/uL (ref 0.0–0.1)
Basophils Relative: 0 %
Eosinophils Absolute: 0 10*3/uL (ref 0.0–0.5)
Eosinophils Relative: 0 %
HCT: 26.8 % — ABNORMAL LOW (ref 36.0–46.0)
Hemoglobin: 9 g/dL — ABNORMAL LOW (ref 12.0–15.0)
Immature Granulocytes: 2 %
Lymphocytes Relative: 3 %
Lymphs Abs: 0.5 10*3/uL — ABNORMAL LOW (ref 0.7–4.0)
MCH: 23.9 pg — ABNORMAL LOW (ref 26.0–34.0)
MCHC: 33.6 g/dL (ref 30.0–36.0)
MCV: 71.3 fL — ABNORMAL LOW (ref 80.0–100.0)
Monocytes Absolute: 0.6 10*3/uL (ref 0.1–1.0)
Monocytes Relative: 3 %
Neutro Abs: 16.8 10*3/uL — ABNORMAL HIGH (ref 1.7–7.7)
Neutrophils Relative %: 92 %
Platelets: 241 10*3/uL (ref 150–400)
RBC: 3.76 MIL/uL — ABNORMAL LOW (ref 3.87–5.11)
RDW: 21 % — ABNORMAL HIGH (ref 11.5–15.5)
WBC: 18.4 10*3/uL — ABNORMAL HIGH (ref 4.0–10.5)
nRBC: 0 % (ref 0.0–0.2)

## 2021-05-23 LAB — RAPID URINE DRUG SCREEN, HOSP PERFORMED
Amphetamines: NOT DETECTED
Barbiturates: NOT DETECTED
Benzodiazepines: NOT DETECTED
Cocaine: POSITIVE — AB
Opiates: POSITIVE — AB
Tetrahydrocannabinol: NOT DETECTED

## 2021-05-23 LAB — COMPREHENSIVE METABOLIC PANEL
ALT: 12 U/L (ref 0–44)
AST: 20 U/L (ref 15–41)
Albumin: 2.3 g/dL — ABNORMAL LOW (ref 3.5–5.0)
Alkaline Phosphatase: 76 U/L (ref 38–126)
Anion gap: 6 (ref 5–15)
BUN: 18 mg/dL (ref 6–20)
CO2: 28 mmol/L (ref 22–32)
Calcium: 7.8 mg/dL — ABNORMAL LOW (ref 8.9–10.3)
Chloride: 94 mmol/L — ABNORMAL LOW (ref 98–111)
Creatinine, Ser: 0.74 mg/dL (ref 0.44–1.00)
GFR, Estimated: >60 mL/min (ref >60)
Glucose, Bld: 93 mg/dL (ref 70–99)
Potassium: 3 mmol/L — ABNORMAL LOW (ref 3.5–5.1)
Sodium: 128 mmol/L — ABNORMAL LOW (ref 135–145)
Total Bilirubin: 0.6 mg/dL (ref 0.3–1.2)
Total Protein: 5.3 g/dL — ABNORMAL LOW (ref 6.5–8.1)

## 2021-05-23 LAB — HEPATIC FUNCTION PANEL
ALT: 14 U/L (ref 0–44)
AST: 23 U/L (ref 15–41)
Albumin: 2.4 g/dL — ABNORMAL LOW (ref 3.5–5.0)
Alkaline Phosphatase: 74 U/L (ref 38–126)
Bilirubin, Direct: 0.1 mg/dL (ref 0.0–0.2)
Indirect Bilirubin: 0.5 mg/dL (ref 0.3–0.9)
Total Bilirubin: 0.6 mg/dL (ref 0.3–1.2)
Total Protein: 5.3 g/dL — ABNORMAL LOW (ref 6.5–8.1)

## 2021-05-23 LAB — RETICULOCYTES
Immature Retic Fract: 12.3 % (ref 2.3–15.9)
RBC.: 4.03 MIL/uL (ref 3.87–5.11)
Retic Count, Absolute: 47.6 10*3/uL (ref 19.0–186.0)
Retic Ct Pct: 1.2 % (ref 0.4–3.1)

## 2021-05-23 LAB — IRON AND TIBC
Iron: 17 ug/dL — ABNORMAL LOW (ref 28–170)
Saturation Ratios: 6 % — ABNORMAL LOW (ref 10.4–31.8)
TIBC: 275 ug/dL (ref 250–450)
UIBC: 258 ug/dL

## 2021-05-23 LAB — CREATININE, URINE, RANDOM: Creatinine, Urine: 42.21 mg/dL

## 2021-05-23 LAB — C-REACTIVE PROTEIN
CRP: 16.9 mg/dL — ABNORMAL HIGH (ref <1.0)
CRP: 17.6 mg/dL — ABNORMAL HIGH (ref <1.0)

## 2021-05-23 LAB — VITAMIN B12: Vitamin B-12: 144 pg/mL — ABNORMAL LOW (ref 180–914)

## 2021-05-23 LAB — D-DIMER, QUANTITATIVE
D-Dimer, Quant: 2 ug/mL-FEU — ABNORMAL HIGH (ref 0.00–0.50)
D-Dimer, Quant: 2.16 ug/mL-FEU — ABNORMAL HIGH (ref 0.00–0.50)

## 2021-05-23 LAB — LACTIC ACID, PLASMA
Lactic Acid, Venous: 1.1 mmol/L (ref 0.5–1.9)
Lactic Acid, Venous: 0.8 mmol/L (ref 0.5–1.9)

## 2021-05-23 LAB — PHOSPHORUS
Phosphorus: 2.9 mg/dL (ref 2.5–4.6)
Phosphorus: 3.2 mg/dL (ref 2.5–4.6)

## 2021-05-23 LAB — TSH
TSH: 0.327 u[IU]/mL — ABNORMAL LOW (ref 0.350–4.500)
TSH: 0.292 u[IU]/mL — ABNORMAL LOW (ref 0.350–4.500)

## 2021-05-23 LAB — MAGNESIUM
Magnesium: 1.7 mg/dL (ref 1.7–2.4)
Magnesium: 1.8 mg/dL (ref 1.7–2.4)

## 2021-05-23 LAB — LACTATE DEHYDROGENASE: LDH: 162 U/L (ref 98–192)

## 2021-05-23 LAB — FOLATE: Folate: 6 ng/mL (ref >5.9)

## 2021-05-23 LAB — FERRITIN
Ferritin: 42 ng/mL (ref 11–307)
Ferritin: 47 ng/mL (ref 11–307)
Ferritin: 45 ng/mL (ref 11–307)

## 2021-05-23 LAB — CK: Total CK: 25 U/L — ABNORMAL LOW (ref 38–234)

## 2021-05-23 LAB — OSMOLALITY, URINE: Osmolality, Ur: 266 mOsm/kg — ABNORMAL LOW (ref 300–900)

## 2021-05-23 LAB — PROCALCITONIN
Procalcitonin: 14.06 ng/mL
Procalcitonin: 15.88 ng/mL

## 2021-05-23 LAB — OSMOLALITY: Osmolality: 267 mOsm/kg — ABNORMAL LOW (ref 275–295)

## 2021-05-23 MED ORDER — MORPHINE SULFATE (PF) 2 MG/ML IV SOLN
2.0000 mg | INTRAVENOUS | Status: DC | PRN
  Administered 2021-05-23: 2 mg via INTRAVENOUS
  Filled 2021-05-23: qty 1

## 2021-05-23 MED ORDER — HYDRALAZINE HCL 50 MG PO TABS
50.0000 mg | ORAL_TABLET | Freq: Two times a day (BID) | ORAL | Status: DC
  Administered 2021-05-23 – 2021-05-24 (×3): 50 mg via ORAL
  Filled 2021-05-23: qty 1
  Filled 2021-05-23: qty 2
  Filled 2021-05-23: qty 1

## 2021-05-23 MED ORDER — OXYCODONE HCL 5 MG PO TABS
10.0000 mg | ORAL_TABLET | ORAL | Status: DC | PRN
  Administered 2021-05-23 – 2021-05-24 (×5): 10 mg via ORAL
  Filled 2021-05-23 (×5): qty 2

## 2021-05-23 MED ORDER — AMLODIPINE BESYLATE 10 MG PO TABS
10.0000 mg | ORAL_TABLET | Freq: Every day | ORAL | Status: DC
  Administered 2021-05-23 – 2021-05-24 (×2): 10 mg via ORAL
  Filled 2021-05-23: qty 2
  Filled 2021-05-23 (×2): qty 1

## 2021-05-23 MED ORDER — LISINOPRIL 10 MG PO TABS
10.0000 mg | ORAL_TABLET | Freq: Every day | ORAL | Status: DC
  Administered 2021-05-23 – 2021-05-24 (×2): 10 mg via ORAL
  Filled 2021-05-23 (×2): qty 1

## 2021-05-23 MED ORDER — POTASSIUM CHLORIDE IN NACL 40-0.9 MEQ/L-% IV SOLN
INTRAVENOUS | Status: DC
  Filled 2021-05-23 (×2): qty 1000

## 2021-05-23 MED ORDER — POTASSIUM CHLORIDE CRYS ER 20 MEQ PO TBCR
40.0000 meq | EXTENDED_RELEASE_TABLET | ORAL | Status: AC
  Filled 2021-05-23 (×2): qty 2

## 2021-05-23 MED ORDER — KETOROLAC TROMETHAMINE 30 MG/ML IJ SOLN
30.0000 mg | Freq: Four times a day (QID) | INTRAMUSCULAR | Status: DC
  Administered 2021-05-23 – 2021-05-24 (×4): 30 mg via INTRAVENOUS
  Filled 2021-05-23 (×4): qty 1

## 2021-05-23 MED ORDER — CYANOCOBALAMIN 1000 MCG/ML IJ SOLN
1000.0000 ug | Freq: Once | INTRAMUSCULAR | Status: DC
  Filled 2021-05-23: qty 1

## 2021-05-23 NOTE — ED Notes (Signed)
Pt is refusing all meds because she is frustrated that she does not have a room yet, even after the nurse explained to her the situation.

## 2021-05-23 NOTE — Assessment & Plan Note (Signed)
COVID infection   So far   asymptomatic  CXR with no infiltrates Hold off on antivirals for tonight Hold off on steroids no  infiltrates or  pulmonary complaints, no  hypoxia No indication for immunomodulators Obtain inflammatory markers Pronate as needed   Supportive measures Avoid over aggressive fluid resuscitation Airborne precautions

## 2021-05-23 NOTE — ED Notes (Signed)
Pt is refusing potassium chloride medication at this time. She states that she cannot swallow the tabs, the nurse said she could break them in half, the pt said no, and then the nurse offered to dissolve the tablets so that she could drink it and the pt still refusing.

## 2021-05-23 NOTE — Sepsis Progress Note (Signed)
Additional lactic acid levels ordered by provider

## 2021-05-23 NOTE — ED Notes (Signed)
Patient states, "I don't need any more of this fluid, stop this now" Normal saline infusion halted per patient request

## 2021-05-23 NOTE — Assessment & Plan Note (Signed)
-   Spoke about importance of quitting spent 5 minutes discussing options for treatment, prior attempts at quitting, and dangers of smoking  -At this point patient is  NOT  interested in quitting    - nursing tobacco cessation protocol

## 2021-05-23 NOTE — Progress Notes (Signed)
PROGRESS NOTE    Terri [name]   MPN:[id number]  DOB: 1975/08/08  DOA: 05/22/2021 PCP: Merryl Hacker, No   Brief Narrative:  Terri [name] with HTN, nicotine and cocaine abuse, PUD presents to the ED for a right leg wound with surrounding redness.   She is having drainage from the wound on the leg that she has been picking at.  In ED : RR > 20, HR > 100 - patient noted to have erythema from ankle to knee, has severe pain and unable to ambulate - WBC 20.6 Lactic acid 2.4 Sodium 130  She has frequent ED visits.  11/15- admitted for asthma vs copd exacerbation Admitted on 11/21 for unresponsiveness thought to be related to drug abuse. She  left AMA. 11/26- back in ED with Hematemesis-  sent home on a PPI  Subjective: Has severe pain in leg, Morphine does not last long enough. She is refusing many oral meds, she states, because she is in too much pain.    Assessment & Plan:   Principal Problem:   Severe Sepsis due to Cellulitis - of 90% of lower leg with drainage from wound - procalcitonin 15.88 CRP 17.6 - WBC only slightly improved to 18.4 - cont Ceftriaxone and Vancomycin - lactic acid has improved to normal  Active Problems:     Essential hypertension - have resumed home meds  IDA, microcytic anemia - Hgb 9.0 - is supposed to be on oral Iron at home - EGD 8/22 > large gastric ulcer - will give IV Iron tomorrow  Vit B12 deficiency - B12 is 144 - will give  B12 1000 mcg today  Hypokalemia - ordered replacement but she is refusing it   Recent hematemesis (ED visit 04/18/21) - started on PPI    Tobacco abuse Cocaine abuse - Cocaine and Tobacco abuse- does not admit to abusing any other drug    Hyponatremia - recheck - start NS if still low  Low TSH  - TSH 0.292 - check Free T 4    COVID-19 virus infection - asymptomatic     Time spent in minutes: 35 DVT prophylaxis: SCDs Start: 05/22/21 2129  Code Status: full code Family Communication:  Level of  Care: Level of care: Telemetry Disposition Plan:  Status is: Inpatient  Remains inpatient appropriate because: treating infection with IV antibiotics       Consultants:  none Procedures:  none Antimicrobials:  Anti-infectives (From admission, onward)    Start     Dose/Rate Route Frequency Ordered Stop   05/23/21 1800  cefTRIAXone (ROCEPHIN) 2 g in sodium chloride 0.9 % 100 mL IVPB        2 g 200 mL/hr over 30 Minutes Intravenous Every 24 hours 05/22/21 2134     05/23/21 1200  vancomycin (VANCOREADY) IVPB 1250 mg/250 mL        1,250 mg 166.7 mL/hr over 90 Minutes Intravenous Every 24 hours 05/22/21 2238     05/23/21 0700  cefTRIAXone (ROCEPHIN) 2 g in sodium chloride 0.9 % 100 mL IVPB  Status:  Discontinued        2 g 200 mL/hr over 30 Minutes Intravenous Every 12 hours 05/22/21 2129 05/22/21 2133   05/22/21 1700  vancomycin (VANCOCIN) IVPB 1000 mg/200 mL premix        1,000 mg 200 mL/hr over 60 Minutes Intravenous  Once 05/22/21 1657 05/22/21 2032   05/22/21 1700  cefTRIAXone (ROCEPHIN) 2 g in sodium chloride 0.9 % 100 mL IVPB  2 g 200 mL/hr over 30 Minutes Intravenous  Once 05/22/21 1657 05/22/21 2033        Objective: Vitals:   05/23/21 0400 05/23/21 0701 05/23/21 1000 05/23/21 1300  BP: (!) 163/108 (!) 173/102 (!) 169/105 (!) 167/107  Pulse: 90 75 81   Resp: (!) 25 18 19 15   Temp:      TempSrc:      SpO2: 95% 96% 100%     Intake/Output Summary (Last 24 hours) at 05/23/2021 1400 Last data filed at 05/23/2021 0131 Gross per 24 hour  Intake 3184.39 ml  Output --  Net 3184.39 ml   There were no vitals filed for this visit.  Examination: General exam: Appears comfortable -  HEENT: PERRLA, oral mucosa moist, no sclera icterus or thrush Respiratory system: Clear to auscultation. Respiratory effort normal. Cardiovascular system: S1 & S2 heard, RRR.   Gastrointestinal system: Abdomen soft, non-tender, nondistended. Normal bowel sounds. Central nervous  system: Alert and oriented. No focal neurological deficits. Extremities: No cyanosis, clubbing - edema of right leg from ankle to knee with tenderness Skin: extensive erythema of lower right leg, scabs notes-  Psychiatry:  Mood & affect appropriate.     Data Reviewed: I have personally reviewed following labs and imaging studies  CBC: Recent Labs  Lab 05/22/21 1610 05/23/21 0500  WBC 20.6* 18.4*  NEUTROABS 19.4* 16.8*  HGB 10.8* 9.0*  HCT 32.6* 26.8*  MCV 71.6* 71.3*  PLT 274 676   Basic Metabolic Panel: Recent Labs  Lab 05/22/21 1610 05/22/21 2115 05/23/21 0500  NA 130*  --  128*  K 3.5  --  3.0*  CL 95*  --  94*  CO2 26  --  28  GLUCOSE 138*  --  93  BUN 17  --  18  CREATININE 0.76  --  0.74  CALCIUM 7.9*  --  7.8*  MG  --  1.7 1.8  PHOS  --  2.9 3.2   GFR: Estimated Creatinine Clearance: 74.7 mL/min (by C-G formula based on SCr of 0.74 mg/dL). Liver Function Tests: Recent Labs  Lab 05/22/21 2115 05/23/21 0500  AST 23 20  ALT 14 12  ALKPHOS 74 76  BILITOT 0.6 0.6  PROT 5.3* 5.3*  ALBUMIN 2.4* 2.3*   No results for input(s): LIPASE, AMYLASE in the last 168 hours. No results for input(s): AMMONIA in the last 168 hours. Coagulation Profile: Recent Labs  Lab 05/22/21 1704  INR 1.1   Cardiac Enzymes: Recent Labs  Lab 05/22/21 2115  CKTOTAL 25*   BNP (last 3 results) No results for input(s): PROBNP in the last 8760 hours. HbA1C: No results for input(s): HGBA1C in the last 72 hours. CBG: No results for input(s): GLUCAP in the last 168 hours. Lipid Profile: No results for input(s): CHOL, HDL, LDLCALC, TRIG, CHOLHDL, LDLDIRECT in the last 72 hours. Thyroid Function Tests: Recent Labs    05/23/21 0500  TSH 0.292*   Anemia Panel: Recent Labs    05/22/21 2115 05/22/21 2117 05/23/21 0500  VITAMINB12 144*  --   --   FOLATE  --  6.0  --   FERRITIN 42 47 45  TIBC 275  --   --   IRON 17*  --   --   RETICCTPCT  --  1.2  --    Urine  analysis:    Component Value Date/Time   COLORURINE YELLOW 05/22/2021 2117   APPEARANCEUR CLEAR 05/22/2021 2117   LABSPEC 1.009 05/22/2021 2117   PHURINE  6.0 05/22/2021 2117   GLUCOSEU NEGATIVE 05/22/2021 2117   HGBUR SMALL (A) 05/22/2021 2117   BILIRUBINUR NEGATIVE 05/22/2021 2117   KETONESUR NEGATIVE 05/22/2021 2117   PROTEINUR NEGATIVE 05/22/2021 2117   UROBILINOGEN 1.0 02/09/2015 1228   NITRITE NEGATIVE 05/22/2021 2117   LEUKOCYTESUR LARGE (A) 05/22/2021 2117   Sepsis Labs: @LABRCNTIP (procalcitonin:4,lacticidven:4) ) Recent Results (from the past 240 hour(s))  Blood Culture (routine x 2)     Status: None (Preliminary result)   Collection Time: 05/22/21  5:06 PM   Specimen: Right Antecubital; Blood  Result Value Ref Range Status   Specimen Description   Final    RIGHT ANTECUBITAL Performed at [facility], [place] [address] [place], [place] [address]    Special Requests   Final    BOTTLES DRAWN AEROBIC AND ANAEROBIC Blood Culture adequate volume Performed at [facility] [address] [place], [place] [address]    Culture   Final    NO GROWTH < 24 HOURS Performed at [facility] [address] [place], [place] [address]    Report Status PENDING  Incomplete  Resp Panel by RT-PCR (Flu A&B, Covid) Nasopharyngeal Swab     Status: Abnormal   Collection Time: 05/22/21  5:07 PM   Specimen: Nasopharyngeal Swab; Nasopharyngeal(NP) swabs in vial transport medium  Result Value Ref Range Status   SARS Coronavirus 2 by RT PCR POSITIVE (A) NEGATIVE Final    Comment: (NOTE) SARS-CoV-2 target nucleic acids are DETECTED.  The SARS-CoV-2 RNA is generally detectable in upper respiratory specimens during the acute phase of infection. Positive results are indicative of the presence of the identified virus, but do not rule out bacterial infection or co-infection with other pathogens not detected by the test. Clinical correlation with  patient history and other diagnostic information is necessary to determine patient infection status. The expected result is Negative.  Fact Sheet for Patients: EntrepreneurPulse.com.au  Fact Sheet for Healthcare Providers: IncredibleEmployment.be  This test is not yet approved or cleared by the [place] FDA and  has been authorized for detection and/or diagnosis of SARS-CoV-2 by FDA under an Emergency Use Authorization (EUA).  This EUA will remain in effect (meaning this test can be used) for the duration of  the COVID-19 declaration under Section 564(b)(1) of the A ct, 21 U.S.C. section 360bbb-3(b)(1), unless the authorization is terminated or revoked sooner.     Influenza A by PCR NEGATIVE NEGATIVE Final   Influenza B by PCR NEGATIVE NEGATIVE Final    Comment: (NOTE) The Xpert Xpress SARS-CoV-2/FLU/RSV plus assay is intended as an aid in the diagnosis of influenza from Nasopharyngeal swab specimens and should not be used as a sole basis for treatment. Nasal washings and aspirates are unacceptable for Xpert Xpress SARS-CoV-2/FLU/RSV testing.  Fact Sheet for Patients: EntrepreneurPulse.com.au  Fact Sheet for Healthcare Providers: IncredibleEmployment.be  This test is not yet approved or cleared by the [place] FDA and has been authorized for detection and/or diagnosis of SARS-CoV-2 by FDA under an Emergency Use Authorization (EUA). This EUA will remain in effect (meaning this test can be used) for the duration of the COVID-19 declaration under Section 564(b)(1) of the Act, 21 U.S.C. section 360bbb-3(b)(1), unless the authorization is terminated or revoked.  Performed at [facility], [place] [address], [place], [place] [address]          Radiology Studies: DG Tibia/Fibula Right  Result Date: 05/22/2021 CLINICAL DATA:  Right lower extremity wound.  Redness  and  swelling. EXAM: RIGHT TIBIA AND FIBULA - 2 VIEW COMPARISON:  None. FINDINGS: Soft tissue swelling is noted along the anterior lower extremity. Soft tissue densities are likely vascular. No underlying osseous abnormality or soft tissue gas present. Knee and ankle joint are unremarkable. IMPRESSION: 1. Soft tissue swelling along the anterior lower extremity without underlying osseous abnormality. Findings consistent with cellulitis. 2. Soft tissue densities are likely vascular. Electronically Signed   By: San Morelle M.D.   On: 05/22/2021 18:45   CT Tibia Fibula Right Wo Contrast  Result Date: 05/22/2021 CLINICAL DATA:  Right leg wound, erythema for 1 week EXAM: CT OF THE LOWER RIGHT EXTREMITY WITHOUT CONTRAST TECHNIQUE: Multidetector CT imaging of the right lower extremity was performed according to the standard protocol. COMPARISON:  05/22/2021 FINDINGS: Bones/Joint/Cartilage There are no acute or destructive bony lesions. Specifically, no evidence of bony destruction or periosteal reaction to suggest osteomyelitis. 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 Mild osteoarthritis within the right hindfoot and midfoot. Ligaments Suboptimally assessed by CT. Muscles and Tendons No acute findings. Soft tissues There is diffuse subcutaneous edema, greatest within the distal right lower leg proximal to the ankle. No evidence of fluid collection or abscess on this unenhanced exam. No subcutaneous gas. Scattered vascular calcifications are noted. Reconstructed images demonstrate no additional findings. IMPRESSION: 1. Marked subcutaneous edema within the distal right lower leg. No evidence of fluid collection or abscess. 2. No acute or destructive bony lesions. No CT evidence of osteomyelitis. Electronically Signed   By: Randa Ngo M.D.   On: 05/22/2021 20:22   DG Chest Port 1 View  Result Date: 05/22/2021 CLINICAL DATA:  Questionable sepsis. EXAM: PORTABLE CHEST 1 VIEW COMPARISON:  Chest  x-ray 04/18/2021. FINDINGS: Lungs are hyperinflated, unchanged. The heart size and mediastinal contours are within normal limits. Both lungs are clear. The visualized skeletal structures are unremarkable. IMPRESSION: No active disease. Electronically Signed   By: Ronney Asters M.D.   On: 05/22/2021 17:20   VAS [place] LOWER EXTREMITY VENOUS (DVT) (7a-7p)  Result Date: 05/23/2021  Lower Venous DVT Study Patient Name:  Terri [name]  Date of Exam:   05/22/2021 Medical Rec #: [id number]      Accession #:    [id number] Date of Birth: Feb 07, 1976      Patient Gender: F Patient Age:   45 years Exam Location:  [facility] Procedure:      VAS [place] LOWER EXTREMITY VENOUS (DVT) Referring Phys: Theodis Blaze --------------------------------------------------------------------------------  Indications: Right lower leg wound with drainage and associated warmth, erythema, and swelling. Per PA note, "I do not believe this to be DVT however given the swelling will obtain imaging.". Other Indications: Remote history of DVT 20+ years ago. Comparison Study: No recent prior studies. All studies on file negative for                   DVT. Performing Technologist: Darlin Coco RDMS, RVT  Examination Guidelines: A complete evaluation includes B-mode imaging, spectral Doppler, color Doppler, and power Doppler as needed of all accessible portions of each vessel. Bilateral testing is considered an integral part of a complete examination. Limited examinations for reoccurring indications may be performed as noted. The reflux portion of the exam is performed with the patient in reverse Trendelenburg.  +---------+---------------+---------+-----------+----------+--------------+  RIGHT     Compressibility Phasicity Spontaneity Properties Thrombus Aging  +---------+---------------+---------+-----------+----------+--------------+  CFV       Full  Yes       Yes                                     +---------+---------------+---------+-----------+----------+--------------+  SFJ       Full                                                             +---------+---------------+---------+-----------+----------+--------------+  FV Prox   Full                                                             +---------+---------------+---------+-----------+----------+--------------+  FV Mid    Full                                                             +---------+---------------+---------+-----------+----------+--------------+  FV Distal Full                                                             +---------+---------------+---------+-----------+----------+--------------+  PFV       Full                                                             +---------+---------------+---------+-----------+----------+--------------+  POP       Full            Yes       Yes                                    +---------+---------------+---------+-----------+----------+--------------+  PTV       Full                                                             +---------+---------------+---------+-----------+----------+--------------+  PERO      Full                                                             +---------+---------------+---------+-----------+----------+--------------+   +----+---------------+---------+-----------+----------+--------------+  LEFT Compressibility Phasicity Spontaneity Properties Thrombus Aging  +----+---------------+---------+-----------+----------+--------------+  CFV  Full            Yes       Yes                                    +----+---------------+---------+-----------+----------+--------------+    Summary: RIGHT: - There is no evidence of deep vein thrombosis in the lower extremity.  - No cystic structure found in the popliteal fossa. - Ultrasound characteristics of enlarged lymph nodes are noted in the groin.  LEFT: - No evidence of common femoral vein obstruction.  *See table(s) above  for measurements and observations. Electronically signed by Monica Martinez MD on 05/23/2021 at 10:59:01 AM.    Final       Scheduled Meds:  amLODipine  10 mg Oral Daily   hydrALAZINE  50 mg Oral BID   ketorolac  30 mg Intravenous Q6H   lisinopril  10 mg Oral Daily   pantoprazole  40 mg Oral Daily   potassium chloride  40 mEq Oral Q4H   Tdap  0.5 mL Intramuscular Once   Continuous Infusions:  cefTRIAXone (ROCEPHIN)  IV     vancomycin 1,250 mg (05/23/21 1231)     LOS: 1 day      Debbe Odea, MD [facility] Pager: [email or web address] 05/23/2021, 2:00 PM

## 2021-05-24 DIAGNOSIS — E538 Deficiency of other specified B group vitamins: Secondary | ICD-10-CM

## 2021-05-24 LAB — URINE CULTURE: Culture: NO GROWTH

## 2021-05-24 MED ORDER — HYDROMORPHONE HCL 1 MG/ML IJ SOLN
1.0000 mg | INTRAMUSCULAR | Status: DC | PRN
  Administered 2021-05-24 (×2): 1 mg via INTRAVENOUS
  Filled 2021-05-24 (×2): qty 1

## 2021-05-24 MED ORDER — HYDROMORPHONE HCL 1 MG/ML IJ SOLN: 0.5000 mg | INTRAMUSCULAR | Status: DC | PRN

## 2021-05-24 MED ORDER — MUPIROCIN 2 % EX OINT: TOPICAL_OINTMENT | Freq: Two times a day (BID) | CUTANEOUS | Status: DC

## 2021-05-24 MED ORDER — PNEUMOCOCCAL VAC POLYVALENT 25 MCG/0.5ML IJ INJ: 0.5000 mL | INJECTION | INTRAMUSCULAR | Status: DC

## 2021-05-24 NOTE — Assessment & Plan Note (Addendum)
She's declining IVF and labs

## 2021-05-24 NOTE — Discharge Summary (Signed)
Patient left AMA.  I received Terri [name] page after the patient had left.  She left after her IV's were removed, but did not sign AMA paperwork.  Called to recommend follow up, no answer.  See previous progress note for additional details regarding this hospitalization.

## 2021-05-24 NOTE — Progress Notes (Addendum)
Skin assessment:  Abrasion to right thumb healed. Left knee anterior healed abrasion, left knee lateral open abrasion, bilateral heels cracked. Pt reports abrasions from a fall. Right lower leg warm, red, swollen with healed abrasion on mid lower area.

## 2021-05-24 NOTE — Consult Note (Signed)
WOC Nurse Consult Note: Patient receiving care in [phone] Consult completed remotely after review of chart, consults, imaging and photos. Reason for Consult: cellulitis to R lower extremity Wound type: RLE cellulitis with moderate erythema medial and posterior. Superficial leg ulcers. Evaluated today by Orthopedics with no indication for surgical interventions at this time. Receiving systemic ABX Pressure Injury POA: NA Dressing procedure/placement/frequency: Clean the RLE with soap and water, rinse and pat dry. Apply a coat of Mupirocin Ointment over the wound, cover with non-adherent gauze and secure with a few turns of Kerlix. Apply twice daily.  Monitor the wound area(s) for worsening of condition such as: Signs/symptoms of infection, increase in size, development of or worsening of odor, development of pain, or increased pain at the affected locations.   Notify the medical team if any of these develop.  Thank you for the consult. [place] nurse will not follow at this time.   Please re-consult the [place] team if needed.  Cathlean Marseilles Tamala Julian, MSN, RN, [place], Lysle Pearl, [facility] Wound Treatment Associate Pager [phone]

## 2021-05-24 NOTE — Assessment & Plan Note (Signed)
Asymptomatic, monitor

## 2021-05-24 NOTE — Assessment & Plan Note (Signed)
Oral iron, consider IV iron once blood cultures >48 hrs

## 2021-05-24 NOTE — Assessment & Plan Note (Addendum)
Notes hx cocaine, denies recent opiate use Utox positive for cocaine and opiates, appears this was after morphine in ED Watch for withdrawal symptoms

## 2021-05-24 NOTE — Progress Notes (Signed)
PROGRESS NOTE    Terri [name]  UMP:[id number] DOB: 09-Nov-1975 DOA: 05/22/2021 PCP: Pcp, No  Chief Complaint  Patient presents with   Insect Bite    Brief Narrative:  46 yo with hx nicotine abuse, cocaine abuse, HTN, peptic ulcer disease who presents with severe pain in right lower extremity, noted to have cellulitis.      Assessment & Plan:   Principal Problem:   Cellulitis Active Problems:   Sepsis ([place])   Polysubstance abuse ([place])   Hyponatremia   Essential hypertension   COVID-19 virus infection   Iron deficiency anemia   Vitamin B12 deficiency   Tobacco abuse   Peptic ulcer disease   Opioid abuse with opioid-induced mood disorder (HCC)   * Cellulitis- (present on admission) RLE Tenderness out of proportion to exam CT with marked subcutaneous edema within distal right lower leg Palpable PT pulse, unable to appreciate DP on exam Continue vanc/ceftriaxone Follow [facility] consult, appreciate assistance  Sepsis ([place])- (present on admission) WBC 18, tachypneic Elevated procalcitonin CRP 17.6, follow sed rate Follow blood cx Urine cx no growth  Polysubstance abuse ([place])- (present on admission) Notes hx cocaine, denies recent opiate use Utox positive for cocaine and opiates, appears this was after morphine in ED Watch for withdrawal symptoms  Hyponatremia- (present on admission) She's declining IVF and labs  Essential hypertension- (present on admission) Hydralazine, amlodipine, lisinopril  COVID-19 virus infection- (present on admission) Asymptomatic, monitor  Vitamin B12 deficiency b12 repletion  Iron deficiency anemia Oral iron, consider IV iron once blood cultures >48 hrs  Tobacco abuse- (present on admission) noted  Peptic ulcer disease Hx roux en y with "giant" ulcer at [place] anastomosis Of note, she was seen in ED 04/21/2021 for hematemesis and was discharged with plan for outpatient follow up Continue PPI   DVT prophylaxis:  scd Code Status: full Family Communication: none at bedside Disposition:   Status is: Inpatient  Remains inpatient appropriate because: need for IV pain meds       Consultants:  orthopedics  Procedures:  none  Antimicrobials:  Anti-infectives (From admission, onward)    Start     Dose/Rate Route Frequency Ordered Stop   05/23/21 1800  cefTRIAXone (ROCEPHIN) 2 g in sodium chloride 0.9 % 100 mL IVPB        2 g 200 mL/hr over 30 Minutes Intravenous Every 24 hours 05/22/21 2134     05/23/21 1200  vancomycin (VANCOREADY) IVPB 1250 mg/250 mL        1,250 mg 166.7 mL/hr over 90 Minutes Intravenous Every 24 hours 05/22/21 2238     05/23/21 0700  cefTRIAXone (ROCEPHIN) 2 g in sodium chloride 0.9 % 100 mL IVPB  Status:  Discontinued        2 g 200 mL/hr over 30 Minutes Intravenous Every 12 hours 05/22/21 2129 05/22/21 2133   05/22/21 1700  vancomycin (VANCOCIN) IVPB 1000 mg/200 mL premix        1,000 mg 200 mL/hr over 60 Minutes Intravenous  Once 05/22/21 1657 05/22/21 2032   05/22/21 1700  cefTRIAXone (ROCEPHIN) 2 g in sodium chloride 0.9 % 100 mL IVPB        2 g 200 mL/hr over 30 Minutes Intravenous  Once 05/22/21 1657 05/22/21 2033       Subjective: Asking for IV pain meds  Objective: Vitals:   05/23/21 2328 05/24/21 0224 05/24/21 0525 05/24/21 1058  BP: (!) 144/124 (!) 153/108 (!) 150/106   Pulse: (!) 106 95 94  Resp: 19 19 18    Temp: 98.8 F (37.1 C) 98.2 F (36.8 C) 98.4 F (36.9 C)   TempSrc: Oral Oral Oral   SpO2: (!) 85% 94%  96%  Weight:      Height:        Intake/Output Summary (Last 24 hours) at 05/24/2021 1137 Last data filed at 05/24/2021 0300 Gross per 24 hour  Intake 1190 ml  Output --  Net 1190 ml   Filed Weights   05/23/21 1938  Weight: 56.4 kg    Examination:  General exam: Appears calm and comfortable  Respiratory system: unlabored Cardiovascular system: RRR Central nervous system: Alert and oriented. No focal neurological  deficits. Extremities: RLE erythematous, slightly more swollen than left - difficult to evaluate compartments as she's tensing leg on exam, but in my limited exam, seem soft, no appreciated crepitus, but she's barely letting us touch her leg.  Scabbed ulceration with surrounding redness.  Palpable PT pulse, unable to appreciate DP Psychiatry: Judgement and insight appear normal. Mood & affect appropriate.     Data Reviewed: I have personally reviewed following labs and imaging studies  CBC: Recent Labs  Lab 05/22/21 1610 05/23/21 0500  WBC 20.6* 18.4*  NEUTROABS 19.4* 16.8*  HGB 10.8* 9.0*  HCT 32.6* 26.8*  MCV 71.6* 71.3*  PLT 274 161    Basic Metabolic Panel: Recent Labs  Lab 05/22/21 1610 05/22/21 2115 05/23/21 0500 05/23/21 1430  NA 130*  --  128* 130*  K 3.5  --  3.0* 3.2*  CL 95*  --  94* 96*  CO2 26  --  28 28  GLUCOSE 138*  --  93 87  BUN 17  --  18 17  CREATININE 0.76  --  0.74 0.61  CALCIUM 7.9*  --  7.8* 7.9*  MG  --  1.7 1.8  --   PHOS  --  2.9 3.2  --     GFR: Estimated Creatinine Clearance: 77.4 mL/min (by C-G formula based on SCr of 0.61 mg/dL).  Liver Function Tests: Recent Labs  Lab 05/22/21 2115 05/23/21 0500  AST 23 20  ALT 14 12  ALKPHOS 74 76  BILITOT 0.6 0.6  PROT 5.3* 5.3*  ALBUMIN 2.4* 2.3*    CBG: No results for input(s): GLUCAP in the last 168 hours.   Recent Results (from the past 240 hour(s))  Blood Culture (routine x 2)     Status: None (Preliminary result)   Collection Time: 05/22/21  5:06 PM   Specimen: Right Antecubital; Blood  Result Value Ref Range Status   Specimen Description   Final    RIGHT ANTECUBITAL Performed at [facility] [address], [place], [place] [address]    Special Requests   Final    BOTTLES DRAWN AEROBIC AND ANAEROBIC Blood Culture adequate volume Performed at [facility] [address], [place], [place] [address]    Culture   Final    NO GROWTH <  24 HOURS Performed at [facility] [address], [place],  [address]    Report Status PENDING  Incomplete  Resp Panel by RT-PCR (Flu Luby Seamans&B, Covid) Nasopharyngeal Swab     Status: Abnormal   Collection Time: 05/22/21  5:07 PM   Specimen: Nasopharyngeal Swab; Nasopharyngeal(NP) swabs in vial transport medium  Result Value Ref Range Status   SARS Coronavirus 2 by RT PCR POSITIVE (Ayeisha Lindenberger) NEGATIVE Final    Comment: (NOTE) SARS-CoV-2 target nucleic acids are DETECTED.  The SARS-CoV-2 RNA is generally detectable in upper respiratory specimens during the acute phase of infection. Positive results are indicative of the presence of the identified virus, but do not rule out bacterial infection or co-infection with other pathogens not detected by the test. Clinical correlation with patient history and other diagnostic information is necessary to determine patient infection status. The expected result is Negative.  Fact Sheet for Patients: EntrepreneurPulse.com.au  Fact Sheet for Healthcare Providers: IncredibleEmployment.be  This test is not yet approved or cleared by the [place] FDA and  has been authorized for detection and/or diagnosis of SARS-CoV-2 by FDA under an Emergency Use Authorization (EUA).  This EUA will remain in effect (meaning this test can be used) for the duration of  the COVID-19 declaration under Section 564(b)(1) of the [place]thea Udovich ct, 21 U.S.C. section 360bbb-3(b)(1), unless the authorization is terminated or revoked sooner.     Influenza Lindee Leason by PCR NEGATIVE NEGATIVE Final   Influenza B by PCR NEGATIVE NEGATIVE Final    Comment: (NOTE) The Xpert Xpress SARS-CoV-2/FLU/RSV plus assay is intended as an aid in the diagnosis of influenza from Nasopharyngeal swab specimens and should not be used as Vineet Kinney sole basis for treatment. Nasal washings and aspirates are unacceptable for Xpert Xpress SARS-CoV-2/FLU/RSV testing.  Fact Sheet for  Patients: EntrepreneurPulse.com.au  Fact Sheet for Healthcare Providers: IncredibleEmployment.be  This test is not yet approved or cleared by the [place] FDA and has been authorized for detection and/or diagnosis of SARS-CoV-2 by FDA under an Emergency Use Authorization (EUA). This EUA will remain in effect (meaning this test can be used) for the duration of the COVID-19 declaration under Section 564(b)(1) of the Act, 21 U.S.C. section 360bbb-3(b)(1), unless the authorization is terminated or revoked.  Performed at [facility], [place] [address], [place], [place] [address]   Urine Culture     Status: None   Collection Time: 05/23/21  1:18 AM   Specimen: Urine, Clean Catch  Result Value Ref Range Status   Specimen Description   Final    URINE, CLEAN CATCH Performed at [facility], [place] [address], [place], [place] [address]    Special Requests   Final    NONE Performed at [facility], [place] [address] [place], [place] [address]    Culture   Final    NO GROWTH Performed at [facility], [place] [address] [place],  [address]    Report Status 05/24/2021 FINAL  Final         Radiology Studies: DG Tibia/Fibula Right  Result Date: 05/22/2021 CLINICAL DATA:  Right lower extremity wound.  Redness and swelling. EXAM: RIGHT TIBIA AND FIBULA - 2 VIEW COMPARISON:  None. FINDINGS: Soft tissue swelling is noted along the anterior lower extremity. Soft tissue densities are likely vascular. No underlying osseous abnormality or soft tissue gas present. Knee and ankle joint are unremarkable. IMPRESSION: 1. Soft tissue swelling along the anterior lower extremity without underlying osseous abnormality. Findings consistent with cellulitis. 2. Soft tissue densities are likely vascular. Electronically Signed   By: San Morelle M.D.   On: 05/22/2021 18:45   CT Tibia Fibula Right Wo  Contrast  Result Date: 05/22/2021 CLINICAL DATA:  Right leg wound, erythema for 1 week EXAM: CT OF THE LOWER RIGHT EXTREMITY WITHOUT CONTRAST TECHNIQUE: Multidetector CT imaging of the right lower extremity was performed according to the standard protocol. COMPARISON:  05/22/2021 FINDINGS: Bones/Joint/Cartilage There are no acute or destructive bony lesions.  Specifically, no evidence of bony destruction or periosteal reaction to suggest osteomyelitis. 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 Mild osteoarthritis within the right hindfoot and midfoot. Ligaments Suboptimally assessed by CT. Muscles and Tendons No acute findings. Soft tissues There is diffuse subcutaneous edema, greatest within the distal right lower leg proximal to the ankle. No evidence of fluid collection or abscess on this unenhanced exam. No subcutaneous gas. Scattered vascular calcifications are noted. Reconstructed images demonstrate no additional findings. IMPRESSION: 1. Marked subcutaneous edema within the distal right lower leg. No evidence of fluid collection or abscess. 2. No acute or destructive bony lesions. No CT evidence of osteomyelitis. Electronically Signed   By: Randa Ngo M.D.   On: 05/22/2021 20:22   DG Chest Port 1 View  Result Date: 05/22/2021 CLINICAL DATA:  Questionable sepsis. EXAM: PORTABLE CHEST 1 VIEW COMPARISON:  Chest x-ray 04/18/2021. FINDINGS: Lungs are hyperinflated, unchanged. The heart size and mediastinal contours are within normal limits. Both lungs are clear. The visualized skeletal structures are unremarkable. IMPRESSION: No active disease. Electronically Signed   By: Ronney Asters M.D.   On: 05/22/2021 17:20   VAS [place] LOWER EXTREMITY VENOUS (DVT) (7a-7p)  Result Date: 05/23/2021  Lower Venous DVT Study Patient Name:  Terri [name]  Date of Exam:   05/22/2021 Medical Rec #: [id number]      Accession #:    [id number] Date of Birth: 05/07/1976      Patient Gender: F Patient Age:   23  years Exam Location:  [facility] Procedure:      VAS [place] LOWER EXTREMITY VENOUS (DVT) Referring Phys: Theodis Blaze --------------------------------------------------------------------------------  Indications: Right lower leg wound with drainage and associated warmth, erythema, and swelling. Per PA note, "I do not believe this to be DVT however given the swelling will obtain imaging.". Other Indications: Remote history of DVT 20+ years ago. Comparison Study: No recent prior studies. All studies on file negative for                   DVT. Performing Technologist: Darlin Coco RDMS, RVT  Examination Guidelines: Khilee Hendricksen complete evaluation includes B-mode imaging, spectral Doppler, color Doppler, and power Doppler as needed of all accessible portions of each vessel. Bilateral testing is considered an integral part of Leo Weyandt complete examination. Limited examinations for reoccurring indications may be performed as noted. The reflux portion of the exam is performed with the patient in reverse Trendelenburg.  +---------+---------------+---------+-----------+----------+--------------+  RIGHT     Compressibility Phasicity Spontaneity Properties Thrombus Aging  +---------+---------------+---------+-----------+----------+--------------+  CFV       Full            Yes       Yes                                    +---------+---------------+---------+-----------+----------+--------------+  SFJ       Full                                                             +---------+---------------+---------+-----------+----------+--------------+  FV Prox   Full                                                             +---------+---------------+---------+-----------+----------+--------------+  FV Mid    Full                                                             +---------+---------------+---------+-----------+----------+--------------+  FV Distal Full                                                              +---------+---------------+---------+-----------+----------+--------------+  PFV       Full                                                             +---------+---------------+---------+-----------+----------+--------------+  POP       Full            Yes       Yes                                    +---------+---------------+---------+-----------+----------+--------------+  PTV       Full                                                             +---------+---------------+---------+-----------+----------+--------------+  PERO      Full                                                             +---------+---------------+---------+-----------+----------+--------------+   +----+---------------+---------+-----------+----------+--------------+  LEFT Compressibility Phasicity Spontaneity Properties Thrombus Aging  +----+---------------+---------+-----------+----------+--------------+  CFV  Full            Yes       Yes                                    +----+---------------+---------+-----------+----------+--------------+    Summary: RIGHT: - There is no evidence of deep vein thrombosis in the lower extremity.  - No cystic structure found in the popliteal fossa. - Ultrasound characteristics of enlarged lymph nodes are noted in the groin.  LEFT: - No evidence of common femoral vein obstruction.  *See table(s) above for measurements and observations. Electronically signed by Monica Martinez MD on 05/23/2021 at 10:59:01 AM.    Final         Scheduled Meds:  amLODipine  10 mg Oral Daily   cyanocobalamin  1,000 mcg Subcutaneous Once   hydrALAZINE  50 mg Oral BID   ketorolac  30 mg Intravenous Q6H   lisinopril  10 mg Oral Daily  pantoprazole  40 mg Oral Daily   [START ON 05/25/2021] pneumococcal 23 valent vaccine  0.5 mL Intramuscular Tomorrow-1000   Tdap  0.5 mL Intramuscular Once   Continuous Infusions:  0.9 % NaCl with KCl 40 mEq / L     cefTRIAXone (ROCEPHIN)  IV Stopped (05/23/21 1834)    vancomycin Stopped (05/23/21 1401)     LOS: 2 days    Time spent: over 30 min    Fayrene Helper, MD [facility]   To contact the attending provider between 7A-7P or the covering provider during after hours 7P-7A, please log into the web site [email or web address] and access using universal [place] password for that web site. If you do not have the password, please call the hospital operator.  05/24/2021, 11:37 AM

## 2021-05-24 NOTE — Assessment & Plan Note (Signed)
Hydralazine, amlodipine, lisinopril

## 2021-05-24 NOTE — Progress Notes (Signed)
On-call provider notified of pt refusals and hypertension.

## 2021-05-24 NOTE — Assessment & Plan Note (Addendum)
Hx roux en y with "giant" ulcer at [place] anastomosis Of note, she was seen in ED 04/21/2021 for hematemesis and was discharged with plan for outpatient follow up Continue PPI

## 2021-05-24 NOTE — Assessment & Plan Note (Addendum)
RLE Tenderness out of proportion to exam CT with marked subcutaneous edema within distal right lower leg Palpable PT pulse, unable to appreciate DP on exam Continue vanc/ceftriaxone Follow [facility] consult, appreciate assistance

## 2021-05-24 NOTE — Assessment & Plan Note (Signed)
noted 

## 2021-05-24 NOTE — Assessment & Plan Note (Signed)
WBC 18, tachypneic Elevated procalcitonin CRP 17.6, follow sed rate Follow blood cx Urine cx no growth

## 2021-05-24 NOTE — Progress Notes (Signed)
Pt refusing cardiac monitoring as she does not want to "be all tangled up", refusing IV fluids, and refusing dressings to legs. RN educated as to the reason for each intervention.  Pt agreed to take amlodipine and hydralazine for hypertension.

## 2021-05-24 NOTE — Consult Note (Signed)
Reason for Consult:right leg pain Referring Physician: [name] is an 46 y.o. female.  HPI: 46 year old female with past medical history significant for polysubstance abuse complains of right leg pain for the last week.  She does not recall any specific injury or insect bites.  She presented to the emergency room 2 days ago and was admitted with an elevated white blood cell count and lactic acid.  She is on IV vancomycin and Rocephin.  I was called to consult due to concern for pain disproportionate to the physical exam findings and concern for compartment syndrome.  Her admission COVID test was positive, and she is on contact precautions.  By report she is asymptomatic from [facility].  Past Medical History:  Diagnosis Date   Arthritis    Back and legs   Bipolar disorder (HCC)    Chronic back pain    Depression    DVT (deep venous thrombosis) (HCC)    early 20's leg   GIB (gastrointestinal bleeding) 09/13/2017   History of blood transfusion    Hypertension    Migraine headache    Neuropathy    Pneumonia    Positive PPD    x 2 last time 09/2017   Sciatica    Stress incontinence     Past Surgical History:  Procedure Laterality Date   ALVEOLOPLASTY Bilateral 01/31/2018   Procedure: ALVEOLOPLASTY;  Surgeon: Diona Browner, DDS;  Location: [facility];  Service: Oral Surgery;  Laterality: Bilateral;   ANKLE SURGERY Left 1992   with hardware   BACK SURGERY     BIOPSY  02/08/2019   Procedure: BIOPSY;  Surgeon: Rush Landmark Telford Nab., MD;  [facility];  Service: Gastroenterology;;   CESAREAN SECTION     CHOLECYSTECTOMY N/A 02/15/2017   Procedure: LAPAROSCOPIC CHOLECYSTECTOMY;  Surgeon: Clovis Riley, MD;  [facility];  Service: General;  Laterality: N/A;   ENTEROSCOPY N/A 02/08/2019   Procedure: ENTEROSCOPY;  Surgeon: Rush Landmark Telford Nab., MD;  [facility];  Service: Gastroenterology;  Laterality: N/A;   ESOPHAGOGASTRODUODENOSCOPY N/A 01/17/2021    Procedure: ESOPHAGOGASTRODUODENOSCOPY (EGD);  Surgeon: Juanita Craver, MD;  [facility];  Service: Endoscopy;  Laterality: N/A;   ESOPHAGOGASTRODUODENOSCOPY (EGD) WITH PROPOFOL N/A 09/15/2017   Procedure: ESOPHAGOGASTRODUODENOSCOPY (EGD) WITH PROPOFOL;  Surgeon: Clarene Essex, MD;  [facility];  Service: Endoscopy;  Laterality: N/A;   ESOPHAGOGASTRODUODENOSCOPY (EGD) WITH PROPOFOL N/A 02/08/2019   Procedure: ESOPHAGOGASTRODUODENOSCOPY (EGD) WITH PROPOFOL;  Surgeon: Rush Landmark Telford Nab., MD;  [facility];  Service: Gastroenterology;  Laterality: N/A;   GASTRIC BYPASS     INCISION AND DRAINAGE OF PERITONSILLAR ABCESS Left 08/13/2017   Procedure: INCISION AND DRAINAGE OF LEFT NECK ABSCESS;  Surgeon: Melida Quitter, MD;  [facility];  Service: ENT;  Laterality: Left;   LAPAROSCOPIC LYSIS OF ADHESIONS  02/15/2017   Procedure: LAPAROSCOPIC LYSIS OF ADHESIONS;  Surgeon: Clovis Riley, MD;  [facility];  Service: General;;   LUMBAR DISC SURGERY     LUMBAR FUSION     SAVORY DILATION N/A 02/08/2019   Procedure: SAVORY DILATION;  Surgeon: Irving Copas., MD;  [facility];  Service: Gastroenterology;  Laterality: N/A;   TOOTH EXTRACTION Bilateral 01/31/2018   Procedure: DENTAL RESTORATION/EXTRACTIONS;  Surgeon: Diona Browner, DDS;  Location: [facility];  Service: Oral Surgery;  Laterality: Bilateral;   TUBAL LIGATION     UPPER GI ENDOSCOPY N/A 02/15/2017   Procedure: UPPER GI ENDOSCOPY;  Surgeon: Clovis Riley, MD;  [facility];  Service: General;  Laterality: N/A;    Family History  Problem Relation Age of Onset   Diabetes Mother    Hypertension Mother    Diabetes Father    Hypertension Father     Social History:  reports that she has been smoking cigarettes. She has a 29.00 pack-year smoking history. She has never used smokeless tobacco. She reports current drug use. Drug: Cocaine. She reports that she does not drink  alcohol.  Allergies:  Allergies  Allergen Reactions   Nsaids Other (See Comments)    Stomach ulcer    Ketorolac Tromethamine Itching and Nausea And Vomiting    Medications: I have reviewed the patient's current medications.  Results for orders placed or performed during the hospital encounter of 05/22/21 (from the past 48 hour(s))  Basic metabolic panel     Status: Abnormal   Collection Time: 05/22/21  4:10 PM  Result Value Ref Range   Sodium 130 (L) 135 - 145 mmol/L   Potassium 3.5 3.5 - 5.1 mmol/L   Chloride 95 (L) 98 - 111 mmol/L   CO2 26 22 - 32 mmol/L   Glucose, Bld 138 (H) 70 - 99 mg/dL    Comment: Glucose reference range applies only to samples taken after fasting for at least 8 hours.   BUN 17 6 - 20 mg/dL   Creatinine, Ser 0.76 0.44 - 1.00 mg/dL   Calcium 7.9 (L) 8.9 - 10.3 mg/dL   GFR, Estimated >60 >60 mL/min    Comment: (NOTE) Calculated using the CKD-EPI Creatinine Equation (2021)    Anion gap 9 5 - 15    Comment: Performed at [facility], [place] [address], [place], [place] [address]  CBC with Differential     Status: Abnormal   Collection Time: 05/22/21  4:10 PM  Result Value Ref Range   WBC 20.6 (H) 4.0 - 10.5 K/uL   RBC 4.55 3.87 - 5.11 MIL/uL   Hemoglobin 10.8 (L) 12.0 - 15.0 g/dL   HCT 32.6 (L) 36.0 - 46.0 %   MCV 71.6 (L) 80.0 - 100.0 fL   MCH 23.7 (L) 26.0 - 34.0 pg   MCHC 33.1 30.0 - 36.0 g/dL   RDW 21.2 (H) 11.5 - 15.5 %   Platelets 274 150 - 400 K/uL   nRBC 0.0 0.0 - 0.2 %   Neutrophils Relative % 94 %   Neutro Abs 19.4 (H) 1.7 - 7.7 K/uL   Lymphocytes Relative 2 %   Lymphs Abs 0.3 (L) 0.7 - 4.0 K/uL   Monocytes Relative 3 %   Monocytes Absolute 0.6 0.1 - 1.0 K/uL   Eosinophils Relative 0 %   Eosinophils Absolute 0.0 0.0 - 0.5 K/uL   Basophils Relative 0 %   Basophils Absolute 0.1 0.0 - 0.1 K/uL   Immature Granulocytes 1 %   Abs Immature Granulocytes 0.23 (H) 0.00 - 0.07 K/uL    Comment: Performed at [facility], [place] [address] [place], [place] [address]  Lactic acid, plasma     Status: Abnormal   Collection Time: 05/22/21  5:04 PM  Result Value Ref Range   Lactic Acid, Venous 2.4 (HH) 0.5 - 1.9 mmol/L    Comment: CRITICAL RESULT CALLED TO, READ BACK BY AND VERIFIED WITH: BELCHER,J ON 05/22/21 AT 1815 BY LUZOLOP Performed at [facility], [place] [address], [place], [place] [address]   Protime-INR     Status: None   Collection Time: 05/22/21  5:04 PM  Result Value Ref Range   Prothrombin Time 14.1 11.4 - 15.2 seconds   INR 1.1 0.8 - 1.2    Comment: (NOTE) INR goal varies based on device and disease states. Performed at [facility], [place] [address] [place], [place] [address]   APTT     Status: None   Collection Time: 05/22/21  5:04 PM  Result Value Ref Range   aPTT 31 24 - 36 seconds    Comment: Performed at [facility], [place] [address] [place], [place] [address]  Blood Culture (routine x 2)     Status: None (Preliminary result)   Collection Time: 05/22/21  5:06 PM   Specimen: Right Antecubital; Blood  Result Value Ref Range   Specimen Description      RIGHT ANTECUBITAL Performed at [place] [address] [place], [place] [address]    Special Requests      BOTTLES DRAWN AEROBIC AND ANAEROBIC Blood Culture adequate volume Performed at [place] [address], [place], [place] [address]    Culture      NO GROWTH 2 DAYS Performed at [facility], [place] [address] [place], [place] [address]    Report Status PENDING   Resp Panel by RT-PCR (Flu A&B, Covid) Nasopharyngeal Swab     Status: Abnormal   Collection Time: 05/22/21  5:07 PM   Specimen: Nasopharyngeal Swab; Nasopharyngeal(NP) swabs in vial transport medium  Result Value Ref Range   SARS Coronavirus 2 by RT PCR POSITIVE (A) NEGATIVE    Comment: (NOTE) SARS-CoV-2 target nucleic acids are  DETECTED.  The SARS-CoV-2 RNA is generally detectable in upper respiratory specimens during the acute phase of infection. Positive results are indicative of the presence of the identified virus, but do not rule out bacterial infection or co-infection with other pathogens not detected by the test. Clinical correlation with patient history and other diagnostic information is necessary to determine patient infection status. The expected result is Negative.  Fact Sheet for Patients: EntrepreneurPulse.com.au  Fact Sheet for Healthcare Providers: IncredibleEmployment.be  This test is not yet approved or cleared by the [place] FDA and  has been authorized for detection and/or diagnosis of SARS-CoV-2 by FDA under an Emergency Use Authorization (EUA).  This EUA will remain in effect (meaning this test can be used) for the duration of  the COVID-19 declaration under Section 564(b)(1) of the A ct, 21 U.S.C. section 360bbb-3(b)(1), unless the authorization is terminated or revoked sooner.     Influenza A by PCR NEGATIVE NEGATIVE   Influenza B by PCR NEGATIVE NEGATIVE    Comment: (NOTE) The Xpert Xpress SARS-CoV-2/FLU/RSV plus assay is intended as an aid in the diagnosis of influenza from Nasopharyngeal swab specimens and should not be used as a sole basis for treatment. Nasal washings and aspirates are unacceptable for Xpert Xpress SARS-CoV-2/FLU/RSV testing.  Fact Sheet for Patients: EntrepreneurPulse.com.au  Fact Sheet for Healthcare Providers: IncredibleEmployment.be  This test is not yet approved or cleared by the [place] FDA and has been authorized for detection and/or diagnosis of SARS-CoV-2 by FDA under an Emergency Use Authorization (EUA). This EUA will remain in effect (meaning this test can be used) for the duration of the COVID-19 declaration under Section 564(b)(1) of the Act, 21  U.S.C. section 360bbb-3(b)(1), unless the authorization is terminated or revoked.  Performed at [facility], [place] [address]  [place], [place] [address]   I-Stat beta hCG  blood, ED     Status: None   Collection Time: 05/22/21  5:15 PM  Result Value Ref Range   I-stat hCG, quantitative <5.0 <5 mIU/mL   Comment 3            Comment:   GEST. AGE      CONC.  (mIU/mL)   <=1 WEEK        5 - 50     2 WEEKS       50 - 500     3 WEEKS       100 - 10,000     4 WEEKS     1,000 - 30,000        FEMALE AND NON-PREGNANT FEMALE:     LESS THAN 5 mIU/mL   Blood Culture (routine x 2)     Status: None (Preliminary result)   Collection Time: 05/22/21  5:55 PM   Specimen: BLOOD  Result Value Ref Range   Specimen Description      BLOOD RIGHT ANTECUBITAL Performed at [facility], [place] [address], [place], [place] [address]    Special Requests      BOTTLES DRAWN AEROBIC AND ANAEROBIC Blood Culture adequate volume Performed at [place] [address] [place], [place] [address]    Culture      NO GROWTH 1 DAY Performed at [place] [address], [place], [place] [address]    Report Status PENDING   Lactic acid, plasma     Status: Abnormal   Collection Time: 05/22/21  6:57 PM  Result Value Ref Range   Lactic Acid, Venous 2.7 (HH) 0.5 - 1.9 mmol/L    Comment: CRITICAL RESULT CALLED TO, READ BACK BY AND VERIFIED WITH: RACHEL, RN @ 2001 ON 05/22/2021 BY Ruffin Frederick, MLT Performed at [facility], [place] [address], [place], [place] [address]   Urinalysis, Routine w reflex microscopic Urine, Clean Catch     Status: Abnormal   Collection Time: 05/22/21  8:08 PM  Result Value Ref Range   Color, Urine YELLOW YELLOW   APPearance HAZY (A) CLEAR   Specific Gravity, Urine 1.011 1.005 - 1.030   pH 6.0 5.0 - 8.0   Glucose, UA NEGATIVE NEGATIVE mg/dL   Hgb urine dipstick SMALL (A) NEGATIVE   Bilirubin Urine NEGATIVE  NEGATIVE   Ketones, ur NEGATIVE NEGATIVE mg/dL   Protein, ur 30 (A) NEGATIVE mg/dL   Nitrite POSITIVE (A) NEGATIVE   Leukocytes,Ua LARGE (A) NEGATIVE   RBC / HPF 6-10 0 - 5 RBC/hpf   WBC, UA >50 (H) 0 - 5 WBC/hpf   Bacteria, UA FEW (A) NONE SEEN   Squamous Epithelial / LPF 0-5 0 - 5   Mucus PRESENT    Hyaline Casts, UA PRESENT     Comment: Performed at [facility], [place] [address] [place], [place] [address]  Urine rapid drug screen (hosp performed)     Status: Abnormal   Collection Time: 05/22/21  9:10 PM  Result Value Ref Range   Opiates POSITIVE (A) NONE DETECTED   Cocaine POSITIVE (A) NONE DETECTED   Benzodiazepines NONE DETECTED NONE DETECTED   Amphetamines NONE DETECTED NONE DETECTED   Tetrahydrocannabinol NONE DETECTED NONE DETECTED   Barbiturates NONE DETECTED NONE DETECTED    Comment: (NOTE) DRUG SCREEN FOR MEDICAL PURPOSES ONLY.  IF CONFIRMATION IS NEEDED FOR ANY PURPOSE, NOTIFY LAB WITHIN 5 DAYS.  LOWEST DETECTABLE LIMITS FOR URINE DRUG SCREEN Drug Class  Cutoff (ng/mL) Amphetamine and metabolites    1000 Barbiturate and metabolites    200 Benzodiazepine                 631 Tricyclics and metabolites     300 Opiates and metabolites        300 Cocaine and metabolites        300 THC                            50 Performed at [facility], [place] [address] [place], [place] [address]   C-reactive protein     Status: Abnormal   Collection Time: 05/22/21  9:15 PM  Result Value Ref Range   CRP 16.9 (H) <1.0 mg/dL    Comment: Performed at [place] [address] [place], [place] [address]  D-dimer, quantitative (not at [facility])     Status: Abnormal   Collection Time: 05/22/21  9:15 PM  Result Value Ref Range   D-Dimer, Quant 2.00 (H) 0.00 - 0.50 ug/mL-FEU    Comment: (NOTE) At the manufacturer cut-off value of 0.5 g/mL FEU, this assay has a negative predictive value of 95-100%.This assay is  intended for use in conjunction with a clinical pretest probability (PTP) assessment model to exclude pulmonary embolism (PE) and deep venous thrombosis (DVT) in outpatients suspected of PE or DVT. Results should be correlated with clinical presentation. Performed at [facility], [place] [address], [place], [place] [address]   Ferritin     Status: None   Collection Time: 05/22/21  9:15 PM  Result Value Ref Range   Ferritin 42 11 - 307 ng/mL    Comment: Performed at [facility], [place] [address] [place], [place] [address]  Fibrinogen     Status: Abnormal   Collection Time: 05/22/21  9:15 PM  Result Value Ref Range   Fibrinogen 651 (H) 210 - 475 mg/dL    Comment: (NOTE) Fibrinogen results may be underestimated in patients receiving thrombolytic therapy. Performed at [facility], [place] [address], [place], [place] [address]   Lactate dehydrogenase     Status: None   Collection Time: 05/22/21  9:15 PM  Result Value Ref Range   LDH 162 98 - 192 U/L    Comment: Performed at [facility], [place] [address] [place], [place] [address]  Procalcitonin     Status: None   Collection Time: 05/22/21  9:15 PM  Result Value Ref Range   Procalcitonin 14.06 ng/mL    Comment:        Interpretation: PCT >= 10 ng/mL: Important systemic inflammatory response, almost exclusively due to severe bacterial sepsis or septic shock. (NOTE)       Sepsis PCT Algorithm           Lower Respiratory Tract                                      Infection PCT Algorithm    ----------------------------     ----------------------------         PCT < 0.25 ng/mL                PCT < 0.10 ng/mL          Strongly encourage             Strongly  discourage   discontinuation of antibiotics    initiation of antibiotics    ----------------------------     -----------------------------       PCT 0.25 - 0.50 ng/mL            PCT 0.10 - 0.25 ng/mL                OR       >80% decrease in PCT            Discourage initiation of                                            antibiotics      Encourage discontinuation           of antibiotics    ----------------------------     -----------------------------         PCT >= 0.50 ng/mL              PCT 0.26 - 0.50 ng/mL                AND       <80% decrease in PCT             Encourage initiation of                                             antibiotics       Encourage continuation           of antibiotics    ----------------------------     -----------------------------        PCT >= 0.50 ng/mL                  PCT > 0.50 ng/mL               AND         increase in PCT                  Strongly encourage                                      initiation of antibiotics    Strongly encourage escalation           of antibiotics                                     -----------------------------                                           PCT <= 0.25 ng/mL                                                 OR                                        >  80% decrease in PCT                                      Discontinue / Do not initiate                                             antibiotics  Performed at [place] [address] [place], [place] [address]   Lactic acid, plasma     Status: None   Collection Time: 05/22/21  9:15 PM  Result Value Ref Range   Lactic Acid, Venous 1.1 0.5 - 1.9 mmol/L    Comment: Performed at [facility], [place] [address], [place], [place] [address]  CK     Status: Abnormal   Collection Time: 05/22/21  9:15 PM  Result Value Ref Range   Total CK 25 (L) 38 - 234 U/L    Comment: Performed at [facility], [place] [address], [place], [place] [address]  Hepatic function panel     Status: Abnormal   Collection Time: 05/22/21  9:15 PM  Result Value Ref Range   Total Protein 5.3 (L) 6.5 - 8.1 g/dL   Albumin 2.4 (L)  3.5 - 5.0 g/dL   AST 23 15 - 41 U/L   ALT 14 0 - 44 U/L   Alkaline Phosphatase 74 38 - 126 U/L   Total Bilirubin 0.6 0.3 - 1.2 mg/dL   Bilirubin, Direct 0.1 0.0 - 0.2 mg/dL   Indirect Bilirubin 0.5 0.3 - 0.9 mg/dL    Comment: Performed at [facility], [place] [address] [place], [place] [address]  Magnesium     Status: None   Collection Time: 05/22/21  9:15 PM  Result Value Ref Range   Magnesium 1.7 1.7 - 2.4 mg/dL    Comment: Performed at [facility], [place] [address] [place], [place] [address]  Phosphorus     Status: None   Collection Time: 05/22/21  9:15 PM  Result Value Ref Range   Phosphorus 2.9 2.5 - 4.6 mg/dL    Comment: Performed at [facility], [place] [address] [place], [place] [address]  Vitamin B12     Status: Abnormal   Collection Time: 05/22/21  9:15 PM  Result Value Ref Range   Vitamin B-12 144 (L) 180 - 914 pg/mL    Comment: (NOTE) This assay is not validated for testing neonatal or myeloproliferative syndrome specimens for Vitamin B12 levels. Performed at [facility], [place] [address] [place], [place] [address]   Iron and TIBC     Status: Abnormal   Collection Time: 05/22/21  9:15 PM  Result Value Ref Range   Iron 17 (L) 28 - 170 ug/dL   TIBC 275 250 - 450 ug/dL   Saturation Ratios 6 (L) 10.4 - 31.8 %   UIBC 258 ug/dL    Comment: Performed at [facility], [place] [address], [place], [place] [address]  Osmolality     Status: Abnormal   Collection Time: 05/22/21  9:15 PM  Result Value Ref Range   Osmolality 267 (L) 275 - 295 mOsm/kg    Comment: Performed at [place] [address] [place], [place] [address]  Folate  Status: None   Collection Time: 05/22/21  9:17 PM  Result Value Ref Range   Folate 6.0 >5.9 ng/mL    Comment: Performed at [facility], [place] [address] [place], [place] [address]  Ferritin     Status: None   Collection  Time: 05/22/21  9:17 PM  Result Value Ref Range   Ferritin 47 11 - 307 ng/mL    Comment: Performed at [facility], [place] [address], [place], [place] [address]  Reticulocytes     Status: None   Collection Time: 05/22/21  9:17 PM  Result Value Ref Range   Retic Ct Pct 1.2 0.4 - 3.1 %   RBC. 4.03 3.87 - 5.11 MIL/uL   Retic Count, Absolute 47.6 19.0 - 186.0 K/uL   Immature Retic Fract 12.3 2.3 - 15.9 %    Comment: Performed at [facility], [place] [address], [place], [place] [address]  Creatinine, urine, random     Status: None   Collection Time: 05/22/21  9:17 PM  Result Value Ref Range   Creatinine, Urine 42.21 mg/dL    Comment: Performed at [facility], [place] [address], [place], [place] [address]  Osmolality, urine     Status: Abnormal   Collection Time: 05/22/21  9:17 PM  Result Value Ref Range   Osmolality, Ur 266 (L) 300 - 900 mOsm/kg    Comment: Performed at [place] [address] [place], [place] [address]  Sodium, urine, random     Status: None   Collection Time: 05/22/21  9:17 PM  Result Value Ref Range   Sodium, Ur 14 mmol/L    Comment: Performed at [facility], [place] [address], [place], [place] [address]  TSH     Status: Abnormal   Collection Time: 05/22/21  9:17 PM  Result Value Ref Range   TSH 0.327 (L) 0.350 - 4.500 uIU/mL    Comment: Performed by a 3rd Generation assay with a functional sensitivity of <=0.01 uIU/mL. Performed at [facility], [place] [address] [place], [place] [address]   Urinalysis, Complete w Microscopic     Status: Abnormal   Collection Time: 05/22/21  9:17 PM  Result Value Ref Range   Color, Urine YELLOW YELLOW   APPearance CLEAR CLEAR   Specific Gravity, Urine 1.009 1.005 - 1.030   pH 6.0 5.0 - 8.0   Glucose, UA NEGATIVE NEGATIVE mg/dL   Hgb urine dipstick SMALL (A) NEGATIVE   Bilirubin Urine NEGATIVE NEGATIVE   Ketones, ur NEGATIVE  NEGATIVE mg/dL   Protein, ur NEGATIVE NEGATIVE mg/dL   Nitrite NEGATIVE NEGATIVE   Leukocytes,Ua LARGE (A) NEGATIVE   RBC / HPF 0-5 0 - 5 RBC/hpf   WBC, UA 21-50 0 - 5 WBC/hpf   Bacteria, UA RARE (A) NONE SEEN   Squamous Epithelial / LPF 0-5 0 - 5   Mucus PRESENT     Comment: Performed at [facility], [place] [address] [place], [place] [address]  Urine Culture     Status: None   Collection Time: 05/23/21  1:18 AM   Specimen: Urine, Clean Catch  Result Value Ref Range   Specimen Description      URINE, CLEAN CATCH Performed at [facility], [place] [address], [place], [place] [address]    Special Requests      NONE Performed at [facility], [place] [address], [place], [place] [address]  Culture      NO GROWTH Performed at [facility], [place] [address] [place], [place] [address]    Report Status 05/24/2021 FINAL   Procalcitonin     Status: None   Collection Time: 05/23/21  1:20 AM  Result Value Ref Range   Procalcitonin 15.88 ng/mL    Comment:        Interpretation: PCT >= 10 ng/mL: Important systemic inflammatory response, almost exclusively due to severe bacterial sepsis or septic shock. (NOTE)       Sepsis PCT Algorithm           Lower Respiratory Tract                                      Infection PCT Algorithm    ----------------------------     ----------------------------         PCT < 0.25 ng/mL                PCT < 0.10 ng/mL          Strongly encourage             Strongly discourage   discontinuation of antibiotics    initiation of antibiotics    ----------------------------     -----------------------------       PCT 0.25 - 0.50 ng/mL            PCT 0.10 - 0.25 ng/mL               OR       >80% decrease in PCT            Discourage initiation of                                            antibiotics      Encourage discontinuation           of antibiotics    ----------------------------      -----------------------------         PCT >= 0.50 ng/mL              PCT 0.26 - 0.50 ng/mL                AND       <80% decrease in PCT             Encourage initiation of                                             antibiotics       Encourage continuation           of antibiotics    ----------------------------     -----------------------------        PCT >= 0.50 ng/mL                  PCT > 0.50 ng/mL               AND         increase in PCT                  Strongly encourage  initiation of antibiotics    Strongly encourage escalation           of antibiotics                                     -----------------------------                                           PCT <= 0.25 ng/mL                                                 OR                                        > 80% decrease in PCT                                      Discontinue / Do not initiate                                             antibiotics  Performed at [facility] [address] [place], [place] [address]   C-reactive protein     Status: Abnormal   Collection Time: 05/23/21  5:00 AM  Result Value Ref Range   CRP 17.6 (H) <1.0 mg/dL    Comment: Performed at [facility] [address] [place], [place] [address]  D-dimer, quantitative     Status: Abnormal   Collection Time: 05/23/21  5:00 AM  Result Value Ref Range   D-Dimer, Quant 2.16 (H) 0.00 - 0.50 ug/mL-FEU    Comment: (NOTE) At the manufacturer cut-off value of 0.5 g/mL FEU, this assay has a negative predictive value of 95-100%.This assay is intended for use in conjunction with a clinical pretest probability (PTP) assessment model to exclude pulmonary embolism (PE) and deep venous thrombosis (DVT) in outpatients suspected of PE or DVT. Results should be correlated with clinical presentation. Performed at [facility], [place] [address], [place], [place]  [address]   Ferritin     Status: None   Collection Time: 05/23/21  5:00 AM  Result Value Ref Range   Ferritin 45 11 - 307 ng/mL    Comment: Performed at [facility], [place] [address] [place], [place] [address]  Magnesium     Status: None   Collection Time: 05/23/21  5:00 AM  Result Value Ref Range   Magnesium 1.8 1.7 - 2.4 mg/dL    Comment: Performed at [facility], [place] [address], [place],  [address]  Phosphorus     Status: None   Collection Time: 05/23/21  5:00 AM  Result Value Ref Range   Phosphorus 3.2 2.5 - 4.6 mg/dL    Comment: Performed at [facility], [place] [address], [place],  [address]  CBC WITH DIFFERENTIAL  Status: Abnormal   Collection Time: 05/23/21  5:00 AM  Result Value Ref Range   WBC 18.4 (H) 4.0 - 10.5 K/uL   RBC 3.76 (L) 3.87 - 5.11 MIL/uL   Hemoglobin 9.0 (L) 12.0 - 15.0 g/dL   HCT 26.8 (L) 36.0 - 46.0 %   MCV 71.3 (L) 80.0 - 100.0 fL   MCH 23.9 (L) 26.0 - 34.0 pg   MCHC 33.6 30.0 - 36.0 g/dL   RDW 21.0 (H) 11.5 - 15.5 %   Platelets 241 150 - 400 K/uL   nRBC 0.0 0.0 - 0.2 %   Neutrophils Relative % 92 %   Neutro Abs 16.8 (H) 1.7 - 7.7 K/uL   Lymphocytes Relative 3 %   Lymphs Abs 0.5 (L) 0.7 - 4.0 K/uL   Monocytes Relative 3 %   Monocytes Absolute 0.6 0.1 - 1.0 K/uL   Eosinophils Relative 0 %   Eosinophils Absolute 0.0 0.0 - 0.5 K/uL   Basophils Relative 0 %   Basophils Absolute 0.1 0.0 - 0.1 K/uL   Immature Granulocytes 2 %   Abs Immature Granulocytes 0.37 (H) 0.00 - 0.07 K/uL    Comment: Performed at [facility], [place] [address], [place], [place] [address]  TSH     Status: Abnormal   Collection Time: 05/23/21  5:00 AM  Result Value Ref Range   TSH 0.292 (L) 0.350 - 4.500 uIU/mL    Comment: Performed by a 3rd Generation assay with a functional sensitivity of <=0.01 uIU/mL. Performed at [facility], [place] [address] [place], [place]  [address]   Comprehensive metabolic panel     Status: Abnormal   Collection Time: 05/23/21  5:00 AM  Result Value Ref Range   Sodium 128 (L) 135 - 145 mmol/L   Potassium 3.0 (L) 3.5 - 5.1 mmol/L   Chloride 94 (L) 98 - 111 mmol/L   CO2 28 22 - 32 mmol/L   Glucose, Bld 93 70 - 99 mg/dL    Comment: Glucose reference range applies only to samples taken after fasting for at least 8 hours.   BUN 18 6 - 20 mg/dL   Creatinine, Ser 0.74 0.44 - 1.00 mg/dL   Calcium 7.8 (L) 8.9 - 10.3 mg/dL   Total Protein 5.3 (L) 6.5 - 8.1 g/dL   Albumin 2.3 (L) 3.5 - 5.0 g/dL   AST 20 15 - 41 U/L   ALT 12 0 - 44 U/L   Alkaline Phosphatase 76 38 - 126 U/L   Total Bilirubin 0.6 0.3 - 1.2 mg/dL   GFR, Estimated >60 >60 mL/min    Comment: (NOTE) Calculated using the CKD-EPI Creatinine Equation (2021)    Anion gap 6 5 - 15    Comment: Performed at [facility], [place] [address], [place], [place] [address]  Lactic acid, plasma     Status: None   Collection Time: 05/23/21  5:00 AM  Result Value Ref Range   Lactic Acid, Venous 0.8 0.5 - 1.9 mmol/L    Comment: Performed at [facility], [place] [address] [place], [place] [address]  Basic metabolic panel     Status: Abnormal   Collection Time: 05/23/21  2:30 PM  Result Value Ref Range   Sodium 130 (L) 135 - 145 mmol/L   Potassium 3.2 (L) 3.5 - 5.1 mmol/L   Chloride 96 (L) 98 - 111 mmol/L   CO2 28 22 - 32 mmol/L   Glucose, Bld 87 70 -  99 mg/dL    Comment: Glucose reference range applies only to samples taken after fasting for at least 8 hours.   BUN 17 6 - 20 mg/dL   Creatinine, Ser 0.61 0.44 - 1.00 mg/dL   Calcium 7.9 (L) 8.9 - 10.3 mg/dL   GFR, Estimated >60 >60 mL/min    Comment: (NOTE) Calculated using the CKD-EPI Creatinine Equation (2021)    Anion gap 6 5 - 15    Comment: Performed at [facility], [place] [address], [place], [place] [address]    DG Tibia/Fibula Right  Result Date:  05/22/2021 CLINICAL DATA:  Right lower extremity wound.  Redness and swelling. EXAM: RIGHT TIBIA AND FIBULA - 2 VIEW COMPARISON:  None. FINDINGS: Soft tissue swelling is noted along the anterior lower extremity. Soft tissue densities are likely vascular. No underlying osseous abnormality or soft tissue gas present. Knee and ankle joint are unremarkable. IMPRESSION: 1. Soft tissue swelling along the anterior lower extremity without underlying osseous abnormality. Findings consistent with cellulitis. 2. Soft tissue densities are likely vascular. Electronically Signed   By: San Morelle M.D.   On: 05/22/2021 18:45   CT Tibia Fibula Right Wo Contrast  Result Date: 05/22/2021 CLINICAL DATA:  Right leg wound, erythema for 1 week EXAM: CT OF THE LOWER RIGHT EXTREMITY WITHOUT CONTRAST TECHNIQUE: Multidetector CT imaging of the right lower extremity was performed according to the standard protocol. COMPARISON:  05/22/2021 FINDINGS: Bones/Joint/Cartilage There are no acute or destructive bony lesions. Specifically, no evidence of bony destruction or periosteal reaction to suggest osteomyelitis. 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 Mild osteoarthritis within the right hindfoot and midfoot. Ligaments Suboptimally assessed by CT. Muscles and Tendons No acute findings. Soft tissues There is diffuse subcutaneous edema, greatest within the distal right lower leg proximal to the ankle. No evidence of fluid collection or abscess on this unenhanced exam. No subcutaneous gas. Scattered vascular calcifications are noted. Reconstructed images demonstrate no additional findings. IMPRESSION: 1. Marked subcutaneous edema within the distal right lower leg. No evidence of fluid collection or abscess. 2. No acute or destructive bony lesions. No CT evidence of osteomyelitis. Electronically Signed   By: Randa Ngo M.D.   On: 05/22/2021 20:22   DG Chest Port 1 View  Result Date: 05/22/2021 CLINICAL DATA:   Questionable sepsis. EXAM: PORTABLE CHEST 1 VIEW COMPARISON:  Chest x-ray 04/18/2021. FINDINGS: Lungs are hyperinflated, unchanged. The heart size and mediastinal contours are within normal limits. Both lungs are clear. The visualized skeletal structures are unremarkable. IMPRESSION: No active disease. Electronically Signed   By: Ronney Asters M.D.   On: 05/22/2021 17:20   VAS [place] LOWER EXTREMITY VENOUS (DVT) (7a-7p)  Result Date: 05/23/2021  Lower Venous DVT Study Patient Name:  Terri [name]  Date of Exam:   05/22/2021 Medical Rec #: [id number]      Accession #:    [id number] Date of Birth: 1975-11-21      Patient Gender: F Patient Age:   46 years Exam Location:  [facility] Procedure:      VAS [place] LOWER EXTREMITY VENOUS (DVT) Referring Phys: Theodis Blaze --------------------------------------------------------------------------------  Indications: Right lower leg wound with drainage and associated warmth, erythema, and swelling. Per PA note, "I do not believe this to be DVT however given the swelling will obtain imaging.". Other Indications: Remote history of DVT 20+ years ago. Comparison Study: No recent prior studies. All studies on file negative for  DVT. Performing Technologist: Darlin Coco RDMS, RVT  Examination Guidelines: A complete evaluation includes B-mode imaging, spectral Doppler, color Doppler, and power Doppler as needed of all accessible portions of each vessel. Bilateral testing is considered an integral part of a complete examination. Limited examinations for reoccurring indications may be performed as noted. The reflux portion of the exam is performed with the patient in reverse Trendelenburg.  +---------+---------------+---------+-----------+----------+--------------+  RIGHT     Compressibility Phasicity Spontaneity Properties Thrombus Aging  +---------+---------------+---------+-----------+----------+--------------+  CFV       Full            Yes       Yes                                     +---------+---------------+---------+-----------+----------+--------------+  SFJ       Full                                                             +---------+---------------+---------+-----------+----------+--------------+  FV Prox   Full                                                             +---------+---------------+---------+-----------+----------+--------------+  FV Mid    Full                                                             +---------+---------------+---------+-----------+----------+--------------+  FV Distal Full                                                             +---------+---------------+---------+-----------+----------+--------------+  PFV       Full                                                             +---------+---------------+---------+-----------+----------+--------------+  POP       Full            Yes       Yes                                    +---------+---------------+---------+-----------+----------+--------------+  PTV       Full                                                             +---------+---------------+---------+-----------+----------+--------------+  PERO      Full                                                             +---------+---------------+---------+-----------+----------+--------------+   +----+---------------+---------+-----------+----------+--------------+  LEFT Compressibility Phasicity Spontaneity Properties Thrombus Aging  +----+---------------+---------+-----------+----------+--------------+  CFV  Full            Yes       Yes                                    +----+---------------+---------+-----------+----------+--------------+    Summary: RIGHT: - There is no evidence of deep vein thrombosis in the lower extremity.  - No cystic structure found in the popliteal fossa. - Ultrasound characteristics of enlarged lymph nodes are noted in the groin.  LEFT: - No evidence of common femoral vein  obstruction.  *See table(s) above for measurements and observations. Electronically signed by Monica Martinez MD on 05/23/2021 at 10:59:01 AM.    Final     ROS: Positive fever, chills, nausea.  No vomiting.  No shortness of breath or chest pain.  No drainage from the right leg.  10 system review was otherwise negative. PE:  Blood pressure (!) 150/106, pulse 94, temperature 98.4 F (36.9 C), temperature source Oral, resp. rate 18, height 5\' 9"  (1.753 m), weight 56.4 kg, SpO2 96 %. Well-developed cachectic female appearing much older than her stated age.  Alert and oriented x4.  Extraocular motions are intact.  Respirations are unlabored.  Edentulous.  Mood is irritable.  Affect is normal.  Right leg has moderate erythema medially and posteriorly at the central one third.  Active plantar flexion and dorsiflexion of the ankle and toes without evident pain at the leg.  Anterior and lateral compartments are supple and nontender.  Superficial and deep posterior compartments are supple but tender to palpation around the area of erythema.  No fluctuance.  Dorsalis pedis pulses 2+.  Intact sensibility to light touch dorsally and plantarly at the forefoot.  There is a healing laceration at the plantar medial aspect of the hallux.  No signs of infection there.  There are 2 superficial ulcers at the posterior leg 1 slightly more medial than the other.  Both are less than a centimeter in size with no drainage.  There are eschar over both lesions.  No crepitus with palpation at the deep and superficial posterior compartments.  5 out of 5 strength in plantarflexion and dorsiflexion of the ankle.  No pain with passive stretch of the anterior, lateral and posterior compartments.  No lymphadenopathy or lymphangitis.  Assessment/Plan: Right leg superficial ulcers and cellulitis -the patient has signs and symptoms consistent with her diagnosis of cellulitis.  Her history and physical exam findings are not consistent with  compartment syndrome, abscess or necrotizing fasciitis.  I do not believe there is a surgical indication at this time.  IV antibiotics per Dr. Florene Glen.  I have written for her diet to resume.  I will sign off.  Please call with any questions.   [phone].  Wylene Simmer 05/24/2021, 12:50 PM

## 2021-05-24 NOTE — Assessment & Plan Note (Signed)
b12 repletion

## 2021-05-24 NOTE — Hospital Course (Signed)
46 yo with hx nicotine abuse, cocaine abuse, HTN, peptic ulcer disease who presents with severe pain in right lower extremity, noted to have cellulitis.

## 2021-05-26 ENCOUNTER — Ambulatory Visit (HOSPITAL_COMMUNITY): Payer: Medicaid Other | Admitting: Clinical

## 2021-05-27 ENCOUNTER — Inpatient Hospital Stay (HOSPITAL_COMMUNITY)
Admission: EM | Admit: 2021-05-27 | Discharge: 2021-05-29 | DRG: 602 | Disposition: A | Discharge status: Home / Self Care | Payer: Medicaid Other | Attending: Family Medicine | Admitting: Family Medicine

## 2021-05-27 ENCOUNTER — Emergency Department (HOSPITAL_COMMUNITY): Payer: Medicaid Other

## 2021-05-27 ENCOUNTER — Encounter: Payer: Self-pay | Admitting: *Deleted

## 2021-05-27 ENCOUNTER — Encounter (HOSPITAL_COMMUNITY): Payer: Self-pay

## 2021-05-27 ENCOUNTER — Ambulatory Visit (HOSPITAL_COMMUNITY): Payer: Self-pay | Admitting: Clinical

## 2021-05-27 ENCOUNTER — Other Ambulatory Visit: Payer: Self-pay

## 2021-05-27 DIAGNOSIS — Z833 Family history of diabetes mellitus: Secondary | ICD-10-CM

## 2021-05-27 DIAGNOSIS — G629 Polyneuropathy, unspecified: Secondary | ICD-10-CM | POA: Diagnosis present

## 2021-05-27 DIAGNOSIS — Z9851 Tubal ligation status: Secondary | ICD-10-CM

## 2021-05-27 DIAGNOSIS — L03115 Cellulitis of right lower limb: Principal | ICD-10-CM | POA: Diagnosis present

## 2021-05-27 DIAGNOSIS — M5430 Sciatica, unspecified side: Secondary | ICD-10-CM | POA: Diagnosis present

## 2021-05-27 DIAGNOSIS — I10 Essential (primary) hypertension: Secondary | ICD-10-CM | POA: Diagnosis present

## 2021-05-27 DIAGNOSIS — G8929 Other chronic pain: Secondary | ICD-10-CM | POA: Diagnosis present

## 2021-05-27 DIAGNOSIS — U071 COVID-19: Secondary | ICD-10-CM | POA: Diagnosis present

## 2021-05-27 DIAGNOSIS — L0390 Cellulitis, unspecified: Secondary | ICD-10-CM | POA: Diagnosis present

## 2021-05-27 DIAGNOSIS — Z9114 Patient's other noncompliance with medication regimen: Secondary | ICD-10-CM

## 2021-05-27 DIAGNOSIS — Z886 Allergy status to analgesic agent status: Secondary | ICD-10-CM

## 2021-05-27 DIAGNOSIS — Z5329 Procedure and treatment not carried out because of patient's decision for other reasons: Secondary | ICD-10-CM | POA: Diagnosis not present

## 2021-05-27 DIAGNOSIS — K219 Gastro-esophageal reflux disease without esophagitis: Secondary | ICD-10-CM | POA: Diagnosis present

## 2021-05-27 DIAGNOSIS — Z86718 Personal history of other venous thrombosis and embolism: Secondary | ICD-10-CM

## 2021-05-27 DIAGNOSIS — F319 Bipolar disorder, unspecified: Secondary | ICD-10-CM | POA: Diagnosis present

## 2021-05-27 DIAGNOSIS — G43909 Migraine, unspecified, not intractable, without status migrainosus: Secondary | ICD-10-CM | POA: Diagnosis present

## 2021-05-27 DIAGNOSIS — I160 Hypertensive urgency: Secondary | ICD-10-CM | POA: Diagnosis present

## 2021-05-27 DIAGNOSIS — Z9884 Bariatric surgery status: Secondary | ICD-10-CM

## 2021-05-27 DIAGNOSIS — F1410 Cocaine abuse, uncomplicated: Secondary | ICD-10-CM | POA: Diagnosis present

## 2021-05-27 DIAGNOSIS — Z888 Allergy status to other drugs, medicaments and biological substances status: Secondary | ICD-10-CM

## 2021-05-27 DIAGNOSIS — F17210 Nicotine dependence, cigarettes, uncomplicated: Secondary | ICD-10-CM | POA: Diagnosis present

## 2021-05-27 DIAGNOSIS — E871 Hypo-osmolality and hyponatremia: Secondary | ICD-10-CM | POA: Diagnosis present

## 2021-05-27 DIAGNOSIS — Z8249 Family history of ischemic heart disease and other diseases of the circulatory system: Secondary | ICD-10-CM

## 2021-05-27 LAB — CBC WITH DIFFERENTIAL/PLATELET
Abs Immature Granulocytes: 0.04 10*3/uL (ref 0.00–0.07)
Basophils Absolute: 0.1 10*3/uL (ref 0.0–0.1)
Basophils Relative: 1 %
Eosinophils Absolute: 0.2 10*3/uL (ref 0.0–0.5)
Eosinophils Relative: 2 %
HCT: 31.8 % — ABNORMAL LOW (ref 36.0–46.0)
Hemoglobin: 10.3 g/dL — ABNORMAL LOW (ref 12.0–15.0)
Immature Granulocytes: 0 %
Lymphocytes Relative: 33 %
Lymphs Abs: 3.1 10*3/uL (ref 0.7–4.0)
MCH: 23.8 pg — ABNORMAL LOW (ref 26.0–34.0)
MCHC: 32.4 g/dL (ref 30.0–36.0)
MCV: 73.4 fL — ABNORMAL LOW (ref 80.0–100.0)
Monocytes Absolute: 0.6 10*3/uL (ref 0.1–1.0)
Monocytes Relative: 6 %
Neutro Abs: 5.5 10*3/uL (ref 1.7–7.7)
Neutrophils Relative %: 58 %
Platelets: 291 10*3/uL (ref 150–400)
RBC: 4.33 MIL/uL (ref 3.87–5.11)
RDW: 21.2 % — ABNORMAL HIGH (ref 11.5–15.5)
WBC: 9.5 10*3/uL (ref 4.0–10.5)
nRBC: 0 % (ref 0.0–0.2)

## 2021-05-27 LAB — COMPREHENSIVE METABOLIC PANEL
ALT: 11 U/L (ref 0–44)
AST: 13 U/L — ABNORMAL LOW (ref 15–41)
Albumin: 3.3 g/dL — ABNORMAL LOW (ref 3.5–5.0)
Alkaline Phosphatase: 83 U/L (ref 38–126)
Anion gap: 5 (ref 5–15)
BUN: 14 mg/dL (ref 6–20)
CO2: 29 mmol/L (ref 22–32)
Calcium: 8.6 mg/dL — ABNORMAL LOW (ref 8.9–10.3)
Chloride: 99 mmol/L (ref 98–111)
Creatinine, Ser: 0.72 mg/dL (ref 0.44–1.00)
GFR, Estimated: >60 mL/min (ref >60)
Glucose, Bld: 96 mg/dL (ref 70–99)
Potassium: 3.5 mmol/L (ref 3.5–5.1)
Sodium: 133 mmol/L — ABNORMAL LOW (ref 135–145)
Total Bilirubin: 0.5 mg/dL (ref 0.3–1.2)
Total Protein: 7.1 g/dL (ref 6.5–8.1)

## 2021-05-27 LAB — CULTURE, BLOOD (ROUTINE X 2)
Culture: NO GROWTH
Special Requests: ADEQUATE

## 2021-05-27 LAB — LACTIC ACID, PLASMA: Lactic Acid, Venous: 0.8 mmol/L (ref 0.5–1.9)

## 2021-05-27 MED ORDER — VANCOMYCIN HCL IN DEXTROSE 1-5 GM/200ML-% IV SOLN
1000.0000 mg | Freq: Once | INTRAVENOUS | Status: AC
  Administered 2021-05-28: 1000 mg via INTRAVENOUS
  Filled 2021-05-27: qty 200

## 2021-05-27 NOTE — [facility] Nurse Program (Signed)
°  Dept: [facility] Nurse Program Note  Date of Encounter: 05/27/2021  Past Medical History: Past Medical History:  Diagnosis Date   Arthritis    Back and legs   Bipolar disorder (HCC)    Chronic back pain    Depression    DVT (deep venous thrombosis) (HCC)    early 20's leg   GIB (gastrointestinal bleeding) 09/13/2017   History of blood transfusion    Hypertension    Migraine headache    Neuropathy    Pneumonia    Positive PPD    x 2 last time 09/2017   Sciatica    Stress incontinence     Encounter Details:  CNP Questionnaire - 05/27/21 1444       Questionnaire   Do you give verbal consent to treat you today? Yes    Location Patient [place] or Organization    Patient Status Unknown    Insurance Medicaid    Insurance Referral N/A    Medication N/A    Medical Provider No    Screening Referrals N/A    Medical Referral [facility]    Medical Appointment Made N/A    Food N/A    Transportation N/A    Housing/Utilities N/A    Interpersonal Safety N/A    Intervention Advocate    ED Visit Averted N/A    Life-Saving Intervention Made N/A            Client heard crying in [facility] lobby. Nurse went to client and she reports her rt leg is hurting. RLE is red and swollen. She reports she went to the ED. Asked client about her medication since going to the hospital and she said she did not stay and get any. Client left ED early. Explained she needed to stay for the doctor to discharge and get prescriptions. Referred to [facility] location today if she did not want to wait at ED. She has a friend at [facility] taking her and has not said where she will go for follow up. Client was wearing her mask down. Per chart she had a positive covid test. Client reports she did not know she was positive. She said she has had some cough. Kasandra Fehr W RN CN

## 2021-05-27 NOTE — ED Provider Notes (Signed)
[facility] DEPT Provider Note   CSN: [id number] Arrival date & time: 05/27/21  1901     History  Chief Complaint  Patient presents with   Leg Swelling    Terri [name] is a 46 y.o. female.  The history is provided by the patient and medical records.  Terri [name] is a 46 y.o. female who presents to the Emergency Department complaining of leg swelling and pain.  She presents to the ED for evaluation of pain and swelling to the RLE for the last week.  She has associated SOB since yesterday.  No chest pain. Has temps to 99 at home.  Has cough productive of green sputum.  Dx with COVID 12/30.  She was recently admitted to the hospital for similar symptoms and discharged AMA on January 1.  She did not take any medications at time of discharge because she did not discussed with staff before leaving the department.  Has a hx/o HTN, supposed to be on amlodipine and lisinopril but has not taken for one year due to cost.    No hx/o blood clots.    Smokes tobacco.  No alcohol.  Uses cocaine (smokes), daily, last use yesterday.     Home Medications Prior to Admission medications   Medication Sig Start Date End Date Taking? Authorizing Provider  acetaminophen (TYLENOL) 500 MG tablet Take 1,000 mg by mouth every 6 (six) hours as needed for mild pain. Patient not taking: Reported on 04/21/2021    [provider]  amLODipine (NORVASC) 10 MG tablet Take 1 tablet (10 mg total) by mouth daily. Patient not taking: Reported on 05/23/2021 04/21/21   Tedd Sias, PA  Ferrous Fumarate (IRON) 18 MG TBCR Take 1 tablet (18 mg total) by mouth 2 (two) times daily after a meal. Patient not taking: Reported on 04/18/2021 04/06/21   Domenic Polite, MD  hydrALAZINE (APRESOLINE) 50 MG tablet Take 1 tablet (50 mg total) by mouth 2 (two) times daily. Patient not taking: Reported on 04/18/2021 04/06/21   Domenic Polite, MD  hydrOXYzine (ATARAX/VISTARIL) 25 MG tablet  Take 1 tablet (25 mg total) by mouth every 8 (eight) hours as needed for anxiety. Patient not taking: Reported on 04/18/2021 04/06/21   Domenic Polite, MD  lisinopril (ZESTRIL) 10 MG tablet Take 1 tablet (10 mg total) by mouth daily. Patient not taking: Reported on 05/23/2021 04/21/21   Tedd Sias, PA  methocarbamol (ROBAXIN) 750 MG tablet Take 1 tablet (750 mg total) by mouth every 8 (eight) hours as needed for muscle spasms. Patient not taking: Reported on 04/18/2021 04/06/21   Domenic Polite, MD  nicotine (NICODERM CQ - DOSED IN MG/24 HOURS) 21 mg/24hr patch Place 1 patch (21 mg total) onto the skin daily. Patient not taking: Reported on 04/18/2021 04/07/21   Domenic Polite, MD  ondansetron (ZOFRAN-ODT) 4 MG disintegrating tablet Take 1 tablet (4 mg total) by mouth every 8 (eight) hours as needed for nausea or vomiting. Patient not taking: Reported on 05/23/2021 04/21/21   Tedd Sias, PA  pantoprazole (PROTONIX) 40 MG tablet Take 1 tablet (40 mg total) by mouth daily. Patient not taking: Reported on 05/23/2021 03/18/21   Edwin Dada, MD  traMADol (ULTRAM) 50 MG tablet Take 1 tablet (50 mg total) by mouth every 6 (six) hours as needed for moderate pain. Patient not taking: Reported on 04/18/2021 04/06/21   Domenic Polite, MD  vitamin B-12 (CYANOCOBALAMIN) 1000 MCG tablet Take 1 tablet (1,000 mcg total)  by mouth daily. Patient not taking: Reported on 04/18/2021 04/06/21   Domenic Polite, MD      Allergies    Nsaids and Ketorolac tromethamine    Review of Systems   Review of Systems  All other systems reviewed and are negative.  Physical Exam Updated Vital Signs BP (!) 193/113    Pulse 78    Temp 98.5 F (36.9 C) (Oral)    Resp 10    Ht 5\' 9"  (1.753 m)    Wt 54.4 kg    SpO2 99%    BMI 17.72 kg/m  Physical Exam Vitals and nursing note reviewed.  Constitutional:      Appearance: She is well-developed.  HENT:     Head: Normocephalic and atraumatic.   Cardiovascular:     Rate and Rhythm: Normal rate and regular rhythm.     Heart sounds: No murmur heard. Pulmonary:     Effort: Pulmonary effort is normal. No respiratory distress.     Breath sounds: Normal breath sounds.  Abdominal:     Palpations: Abdomen is soft.     Tenderness: There is no abdominal tenderness. There is no guarding or rebound.  Musculoskeletal:        General: Tenderness present.     Comments: 2-3+ pitting edema to the right lower extremity.  2+ DP pulses bilaterally.  There is significant erythema and tenderness to palpation throughout the right leg from the proximal shin to the ankle.  There is associated edema without erythema throughout the foot.  Skin:    General: Skin is warm and dry.  Neurological:     Mental Status: She is alert and oriented to person, place, and time.  Psychiatric:        Behavior: Behavior normal.    ED Results / Procedures / Treatments   Labs (all labs ordered are listed, but only abnormal results are displayed) Labs Reviewed  COMPREHENSIVE METABOLIC PANEL - Abnormal; Notable for the following components:      Result Value   Sodium 133 (*)    Calcium 8.6 (*)    Albumin 3.3 (*)    AST 13 (*)    All other components within normal limits  CBC WITH DIFFERENTIAL/PLATELET - Abnormal; Notable for the following components:   Hemoglobin 10.3 (*)    HCT 31.8 (*)    MCV 73.4 (*)    MCH 23.8 (*)    RDW 21.2 (*)    All other components within normal limits  CULTURE, BLOOD (ROUTINE X 2)  CULTURE, BLOOD (ROUTINE X 2)  LACTIC ACID, PLASMA  LACTIC ACID, PLASMA    EKG None  Radiology No results found.  Procedures Procedures    Medications Ordered in ED Medications - No data to display  ED Course/ Medical Decision Making/ A&P                           Medical Decision Making  Patient with history of hypertension, cocaine abuse here for evaluation of right lower extremity pain, recently diagnosed with cellulitis.  Her pain  and swelling have worsened.  On examination she does have evidence of cellulitis.  Records reviewed in epic.  She did have a vascular ultrasound performed several days ago, which was negative for DVT.  Given her new onset shortness of breath a CTA was obtained, which is negative for acute PE.  She was treated with antibiotics for cellulitis.  She has severe pain to the right  lower extremity but no clinical evidence of compartment syndrome.  Plan to admit to the medicine service for ongoing treatment.  Patient is in agreement with admission at this point in time.        Final Clinical Impression(s) / ED Diagnoses Final diagnoses:  None    Rx / DC Orders ED Discharge Orders     None         Quintella Reichert, MD 05/28/21 [phone]

## 2021-05-27 NOTE — ED Provider Triage Note (Signed)
Emergency Medicine Provider Triage Evaluation Note  Terri [name] , a 46 y.o. female  was evaluated in triage.  Pt complains of ongoing pain in the right leg with URI symptoms.  Patient was admitted on December 30 for sepsis secondary to cellulitis in her right leg.  Her COVID swab was positive, she was asymptomatic at that time.  Patient walked out [facility] on January 1.  Review of Systems  Positive: Leg pain and swelling, cough and shortness of breath Negative:   Physical Exam  BP (!) 197/129 (BP Location: Right Arm)    Pulse 96    Temp 98.5 F (36.9 C) (Oral)    Resp 15    Ht 5\' 9"  (1.753 m)    Wt 54.4 kg    SpO2 98%    BMI 17.72 kg/m  Gen:   Awake, no distress   Resp:  Normal effort  MSK:   Moves extremities without difficulty  Other:  Erythema to right lower leg with wound posteriorly  Medical Decision Making  Medically screening exam initiated at 8:19 PM.  Appropriate orders placed.  [name] was informed that the remainder of the evaluation will be completed by another provider, this initial triage assessment does not replace that evaluation, and the importance of remaining in the ED until their evaluation is complete.     [name], PA-C 05/27/21 2020

## 2021-05-27 NOTE — ED Triage Notes (Addendum)
Pt reports wound to right lower leg with swelling and mild drainage; states she was admitted for it but left AMA two days ago. Also reports SOB, cough, congestion, runny nose and headache. BP in triage 197/129 but noncompliant with BP meds. +Covid on 12/30

## 2021-05-28 ENCOUNTER — Inpatient Hospital Stay (HOSPITAL_COMMUNITY): Payer: Medicaid Other

## 2021-05-28 ENCOUNTER — Emergency Department (HOSPITAL_COMMUNITY): Payer: Medicaid Other

## 2021-05-28 ENCOUNTER — Encounter (HOSPITAL_COMMUNITY): Payer: Self-pay

## 2021-05-28 ENCOUNTER — Ambulatory Visit (HOSPITAL_COMMUNITY): Payer: Medicaid Other | Admitting: Psychiatry

## 2021-05-28 DIAGNOSIS — F319 Bipolar disorder, unspecified: Secondary | ICD-10-CM | POA: Diagnosis present

## 2021-05-28 DIAGNOSIS — I10 Essential (primary) hypertension: Secondary | ICD-10-CM | POA: Diagnosis present

## 2021-05-28 DIAGNOSIS — I160 Hypertensive urgency: Secondary | ICD-10-CM | POA: Diagnosis present

## 2021-05-28 DIAGNOSIS — Z888 Allergy status to other drugs, medicaments and biological substances status: Secondary | ICD-10-CM | POA: Diagnosis not present

## 2021-05-28 DIAGNOSIS — G43909 Migraine, unspecified, not intractable, without status migrainosus: Secondary | ICD-10-CM | POA: Diagnosis present

## 2021-05-28 DIAGNOSIS — E871 Hypo-osmolality and hyponatremia: Secondary | ICD-10-CM | POA: Diagnosis present

## 2021-05-28 DIAGNOSIS — G8929 Other chronic pain: Secondary | ICD-10-CM | POA: Diagnosis present

## 2021-05-28 DIAGNOSIS — Z833 Family history of diabetes mellitus: Secondary | ICD-10-CM | POA: Diagnosis not present

## 2021-05-28 DIAGNOSIS — L03115 Cellulitis of right lower limb: Principal | ICD-10-CM

## 2021-05-28 DIAGNOSIS — Z86718 Personal history of other venous thrombosis and embolism: Secondary | ICD-10-CM | POA: Diagnosis not present

## 2021-05-28 DIAGNOSIS — F1410 Cocaine abuse, uncomplicated: Secondary | ICD-10-CM | POA: Diagnosis present

## 2021-05-28 DIAGNOSIS — K219 Gastro-esophageal reflux disease without esophagitis: Secondary | ICD-10-CM | POA: Diagnosis present

## 2021-05-28 DIAGNOSIS — M5430 Sciatica, unspecified side: Secondary | ICD-10-CM | POA: Diagnosis present

## 2021-05-28 DIAGNOSIS — Z9114 Patient's other noncompliance with medication regimen: Secondary | ICD-10-CM | POA: Diagnosis not present

## 2021-05-28 DIAGNOSIS — Z5329 Procedure and treatment not carried out because of patient's decision for other reasons: Secondary | ICD-10-CM | POA: Diagnosis not present

## 2021-05-28 DIAGNOSIS — G629 Polyneuropathy, unspecified: Secondary | ICD-10-CM | POA: Diagnosis present

## 2021-05-28 DIAGNOSIS — Z9884 Bariatric surgery status: Secondary | ICD-10-CM | POA: Diagnosis not present

## 2021-05-28 DIAGNOSIS — F17210 Nicotine dependence, cigarettes, uncomplicated: Secondary | ICD-10-CM | POA: Diagnosis present

## 2021-05-28 DIAGNOSIS — U071 COVID-19: Secondary | ICD-10-CM | POA: Diagnosis present

## 2021-05-28 DIAGNOSIS — Z8249 Family history of ischemic heart disease and other diseases of the circulatory system: Secondary | ICD-10-CM | POA: Diagnosis not present

## 2021-05-28 DIAGNOSIS — Z9851 Tubal ligation status: Secondary | ICD-10-CM | POA: Diagnosis not present

## 2021-05-28 DIAGNOSIS — Z886 Allergy status to analgesic agent status: Secondary | ICD-10-CM | POA: Diagnosis not present

## 2021-05-28 LAB — CREATININE, SERUM
Creatinine, Ser: 0.74 mg/dL (ref 0.44–1.00)
GFR, Estimated: >60 mL/min (ref >60)

## 2021-05-28 LAB — CBC
HCT: 32.2 % — ABNORMAL LOW (ref 36.0–46.0)
Hemoglobin: 10.4 g/dL — ABNORMAL LOW (ref 12.0–15.0)
MCH: 23.3 pg — ABNORMAL LOW (ref 26.0–34.0)
MCHC: 32.3 g/dL (ref 30.0–36.0)
MCV: 72.2 fL — ABNORMAL LOW (ref 80.0–100.0)
Platelets: 290 10*3/uL (ref 150–400)
RBC: 4.46 MIL/uL (ref 3.87–5.11)
RDW: 21.2 % — ABNORMAL HIGH (ref 11.5–15.5)
WBC: 5.9 10*3/uL (ref 4.0–10.5)
nRBC: 0 % (ref 0.0–0.2)

## 2021-05-28 LAB — TROPONIN I (HIGH SENSITIVITY): Troponin I (High Sensitivity): 9 ng/L (ref <18)

## 2021-05-28 LAB — BRAIN NATRIURETIC PEPTIDE: B Natriuretic Peptide: 51.3 pg/mL (ref 0.0–100.0)

## 2021-05-28 LAB — CULTURE, BLOOD (ROUTINE X 2)
Culture: NO GROWTH
Special Requests: ADEQUATE

## 2021-05-28 LAB — LACTIC ACID, PLASMA: Lactic Acid, Venous: 0.7 mmol/L (ref 0.5–1.9)

## 2021-05-28 MED ORDER — HYDROCOD POLST-CPM POLST ER 10-8 MG/5ML PO SUER: 5.0000 mL | Freq: Two times a day (BID) | ORAL | Status: DC | PRN

## 2021-05-28 MED ORDER — PREDNISONE 5 MG PO TABS
50.0000 mg | ORAL_TABLET | Freq: Every day | ORAL | Status: DC
  Administered 2021-05-28 – 2021-05-29 (×2): 50 mg via ORAL
  Filled 2021-05-28: qty 2
  Filled 2021-05-28: qty 1

## 2021-05-28 MED ORDER — OXYCODONE HCL 5 MG PO TABS
10.0000 mg | ORAL_TABLET | ORAL | Status: DC | PRN
  Administered 2021-05-28 – 2021-05-29 (×3): 10 mg via ORAL
  Filled 2021-05-28 (×3): qty 2

## 2021-05-28 MED ORDER — ASCORBIC ACID 500 MG PO TABS
500.0000 mg | ORAL_TABLET | Freq: Every day | ORAL | Status: DC
  Administered 2021-05-28 – 2021-05-29 (×2): 500 mg via ORAL
  Filled 2021-05-28 (×2): qty 1

## 2021-05-28 MED ORDER — ACETAMINOPHEN 325 MG PO TABS: 650.0000 mg | ORAL_TABLET | Freq: Four times a day (QID) | ORAL | Status: DC | PRN

## 2021-05-28 MED ORDER — HYDROXYZINE HCL 25 MG PO TABS
25.0000 mg | ORAL_TABLET | Freq: Three times a day (TID) | ORAL | Status: DC | PRN
  Administered 2021-05-28 – 2021-05-29 (×2): 25 mg via ORAL
  Filled 2021-05-28 (×2): qty 1

## 2021-05-28 MED ORDER — OXYCODONE HCL 5 MG PO TABS
5.0000 mg | ORAL_TABLET | Freq: Once | ORAL | Status: AC
  Administered 2021-05-28: 5 mg via ORAL
  Filled 2021-05-28: qty 1

## 2021-05-28 MED ORDER — HYDRALAZINE HCL 20 MG/ML IJ SOLN: 10.0000 mg | Freq: Three times a day (TID) | INTRAMUSCULAR | Status: DC | PRN

## 2021-05-28 MED ORDER — HYDROMORPHONE HCL 1 MG/ML IJ SOLN
1.0000 mg | INTRAMUSCULAR | Status: DC | PRN
  Administered 2021-05-28 – 2021-05-29 (×3): 1 mg via INTRAVENOUS
  Filled 2021-05-28 (×3): qty 1

## 2021-05-28 MED ORDER — SODIUM CHLORIDE 0.9 % IV SOLN: INTRAVENOUS | Status: DC

## 2021-05-28 MED ORDER — ONDANSETRON HCL 4 MG PO TABS: 4.0000 mg | ORAL_TABLET | Freq: Four times a day (QID) | ORAL | Status: DC | PRN

## 2021-05-28 MED ORDER — ACETAMINOPHEN 650 MG RE SUPP: 650.0000 mg | Freq: Four times a day (QID) | RECTAL | Status: DC | PRN

## 2021-05-28 MED ORDER — GUAIFENESIN-DM 100-10 MG/5ML PO SYRP: 10.0000 mL | ORAL_SOLUTION | ORAL | Status: DC | PRN

## 2021-05-28 MED ORDER — NICOTINE 21 MG/24HR TD PT24
21.0000 mg | MEDICATED_PATCH | Freq: Every day | TRANSDERMAL | Status: DC
  Administered 2021-05-28 – 2021-05-29 (×2): 21 mg via TRANSDERMAL
  Filled 2021-05-28 (×2): qty 1

## 2021-05-28 MED ORDER — DOCUSATE SODIUM 100 MG PO CAPS
100.0000 mg | ORAL_CAPSULE | Freq: Two times a day (BID) | ORAL | Status: DC
  Administered 2021-05-28 – 2021-05-29 (×3): 100 mg via ORAL
  Filled 2021-05-28 (×3): qty 1

## 2021-05-28 MED ORDER — ZINC SULFATE 220 (50 ZN) MG PO CAPS
220.0000 mg | ORAL_CAPSULE | Freq: Every day | ORAL | Status: DC
  Administered 2021-05-28 – 2021-05-29 (×2): 220 mg via ORAL
  Filled 2021-05-28 (×2): qty 1

## 2021-05-28 MED ORDER — ENOXAPARIN SODIUM 40 MG/0.4ML IJ SOSY
40.0000 mg | PREFILLED_SYRINGE | INTRAMUSCULAR | Status: DC
  Filled 2021-05-28: qty 0.4

## 2021-05-28 MED ORDER — LISINOPRIL 10 MG PO TABS
10.0000 mg | ORAL_TABLET | Freq: Every day | ORAL | Status: DC
  Administered 2021-05-28 – 2021-05-29 (×2): 10 mg via ORAL
  Filled 2021-05-28 (×2): qty 1

## 2021-05-28 MED ORDER — AMLODIPINE BESYLATE 10 MG PO TABS
10.0000 mg | ORAL_TABLET | Freq: Every day | ORAL | Status: DC
  Administered 2021-05-28 – 2021-05-29 (×2): 10 mg via ORAL
  Filled 2021-05-28: qty 2
  Filled 2021-05-28: qty 1

## 2021-05-28 MED ORDER — HYDRALAZINE HCL 20 MG/ML IJ SOLN
10.0000 mg | Freq: Three times a day (TID) | INTRAMUSCULAR | Status: DC | PRN
  Administered 2021-05-28 – 2021-05-29 (×2): 10 mg via INTRAVENOUS
  Filled 2021-05-28 (×2): qty 1

## 2021-05-28 MED ORDER — IOHEXOL 350 MG/ML SOLN: 80.0000 mL | Freq: Once | INTRAVENOUS | Status: DC | PRN

## 2021-05-28 MED ORDER — HYDRALAZINE HCL 50 MG PO TABS
50.0000 mg | ORAL_TABLET | Freq: Two times a day (BID) | ORAL | Status: DC
  Administered 2021-05-28 – 2021-05-29 (×3): 50 mg via ORAL
  Filled 2021-05-28: qty 2
  Filled 2021-05-28 (×2): qty 1

## 2021-05-28 MED ORDER — VANCOMYCIN HCL 1250 MG/250ML IV SOLN
1250.0000 mg | INTRAVENOUS | Status: DC
  Administered 2021-05-28: 1250 mg via INTRAVENOUS
  Filled 2021-05-28: qty 250

## 2021-05-28 MED ORDER — IPRATROPIUM-ALBUTEROL 20-100 MCG/ACT IN AERS
1.0000 | INHALATION_SPRAY | Freq: Four times a day (QID) | RESPIRATORY_TRACT | Status: DC | PRN
  Filled 2021-05-28: qty 4

## 2021-05-28 MED ORDER — ONDANSETRON HCL 4 MG/2ML IJ SOLN: 4.0000 mg | Freq: Four times a day (QID) | INTRAMUSCULAR | Status: DC | PRN

## 2021-05-28 MED ORDER — PANTOPRAZOLE SODIUM 40 MG PO TBEC
40.0000 mg | DELAYED_RELEASE_TABLET | Freq: Every day | ORAL | Status: DC
  Administered 2021-05-28 – 2021-05-29 (×2): 40 mg via ORAL
  Filled 2021-05-28 (×2): qty 1

## 2021-05-28 MED ORDER — IOHEXOL 350 MG/ML SOLN
80.0000 mL | Freq: Once | INTRAVENOUS | Status: AC | PRN
  Administered 2021-05-28: 80 mL via INTRAVENOUS

## 2021-05-28 NOTE — ED Notes (Addendum)
This nurse entered the pt's room to round on pt. This nurse introduced herself to the pt and attempted to obtain repeat vital signs. Pt hypertensive and uncooperative with finger pulse ox. Otherwise HR WNL and pt afebrile. Pt yelled at this nurse, "I've been in this hospital for x-amount of days and y'all haven't done nothing for me. You ain't gonna treat me some type of way." Pt then stated, "I want something to eat!" This nurse explained to the pt that she would have to wait for dietary to deliver breakfast trays this morning, or the pt  could have graham crackers and peanut butter while she waited. The pt again yelled this nurse, "I don't want graham crackers, I want something to eat!" Pt abruptly exited stretcher to use bedside commode and shouted, "take this shit off of me! I wanna leave!" This nurse exited the exam room and ED tech gave pt apple juice as requested, as well as coffee.

## 2021-05-28 NOTE — ED Notes (Signed)
Hospitalist paged regarding pt's elevated BP.

## 2021-05-28 NOTE — Plan of Care (Signed)
  Problem: Clinical Measurements: Goal: Ability to maintain clinical measurements within normal limits will improve Outcome: Progressing   Problem: Clinical Measurements: Goal: Will remain free from infection Outcome: Progressing   Problem: Pain Managment: Goal: General experience of comfort will improve Outcome: Progressing   

## 2021-05-28 NOTE — ED Notes (Addendum)
Discussed with Hospitalist, Dr. Marylyn Ishihara, pt hypertensive BP. Additionally, pt remains NPO at this time until hospitalist rounding. No new orders or recommendations from Hospitalist this time. Hospitalist to round on pt. Will continue to monitor.

## 2021-05-28 NOTE — H&P (Signed)
History and Physical    Terri [name] QQI:[id number] DOB: 1976/01/11 DOA: 05/27/2021  PCP: Pcp, No  Patient coming from: Home  Chief Complaint: right leg pain  HPI: Terri [name] is a 46 y.o. female with medical history significant of HTN, GERD, polysubstance abuse. Presenting with RLE swelling. She was seen in ED several days ago for the same. At the time she was evaluated by ortho. No surgical intervention needed at that time. She left AMA after a couple of doses of IV abx. She returned home and her leg worsened. She reports drainage from it off and on over the last several days. She reports fever and pain. When her symptoms did not improve, she decided to come to the ED for help. She denies any other aggravating or alleviating factors.   ED Course: She was started on vanc. CTA chest was checked d/t dyspnea. TRH was called for admission.   Review of Systems:  Denies CP, palpitations, abdominal pain, N/V/D. Reports fever, RLE pain.  Review of systems is otherwise negative for all not mentioned in HPI.   PMHx Past Medical History:  Diagnosis Date   Arthritis    Back and legs   Bipolar disorder (HCC)    Chronic back pain    Depression    DVT (deep venous thrombosis) (HCC)    early 20's leg   GIB (gastrointestinal bleeding) 09/13/2017   History of blood transfusion    Hypertension    Migraine headache    Neuropathy    Pneumonia    Positive PPD    x 2 last time 09/2017   Sciatica    Stress incontinence     PSHx Past Surgical History:  Procedure Laterality Date   ALVEOLOPLASTY Bilateral 01/31/2018   Procedure: ALVEOLOPLASTY;  Surgeon: Diona Browner, DDS;  Location: [facility];  Service: Oral Surgery;  Laterality: Bilateral;   ANKLE SURGERY Left 1992   with hardware   BACK SURGERY     BIOPSY  02/08/2019   Procedure: BIOPSY;  Surgeon: Rush Landmark Telford Nab., MD;  Location: [facility];  Service: Gastroenterology;;   CESAREAN SECTION     CHOLECYSTECTOMY N/A 02/15/2017   Procedure:  LAPAROSCOPIC CHOLECYSTECTOMY;  Surgeon: Clovis Riley, MD;  Location: [facility];  Service: General;  Laterality: N/A;   ENTEROSCOPY N/A 02/08/2019   Procedure: ENTEROSCOPY;  Surgeon: Rush Landmark Telford Nab., MD;  Location: [facility];  Service: Gastroenterology;  Laterality: N/A;   ESOPHAGOGASTRODUODENOSCOPY N/A 01/17/2021   Procedure: ESOPHAGOGASTRODUODENOSCOPY (EGD);  Surgeon: Juanita Craver, MD;  Location: [facility];  Service: Endoscopy;  Laterality: N/A;   ESOPHAGOGASTRODUODENOSCOPY (EGD) WITH PROPOFOL N/A 09/15/2017   Procedure: ESOPHAGOGASTRODUODENOSCOPY (EGD) WITH PROPOFOL;  Surgeon: Clarene Essex, MD;  Location: [facility];  Service: Endoscopy;  Laterality: N/A;   ESOPHAGOGASTRODUODENOSCOPY (EGD) WITH PROPOFOL N/A 02/08/2019   Procedure: ESOPHAGOGASTRODUODENOSCOPY (EGD) WITH PROPOFOL;  Surgeon: Rush Landmark Telford Nab., MD;  Location: [facility];  Service: Gastroenterology;  Laterality: N/A;   GASTRIC BYPASS     INCISION AND DRAINAGE OF PERITONSILLAR ABCESS Left 08/13/2017   Procedure: INCISION AND DRAINAGE OF LEFT NECK ABSCESS;  Surgeon: Melida Quitter, MD;  Location: [facility];  Service: ENT;  Laterality: Left;   LAPAROSCOPIC LYSIS OF ADHESIONS  02/15/2017   Procedure: LAPAROSCOPIC LYSIS OF ADHESIONS;  Surgeon: Clovis Riley, MD;  Location: [facility];  Service: General;;   LUMBAR DISC SURGERY     LUMBAR FUSION     SAVORY DILATION N/A 02/08/2019   Procedure: Azzie Almas DILATION;  Surgeon: Irving Copas.,  MD;  Location: [facility];  Service: Gastroenterology;  Laterality: N/A;   TOOTH EXTRACTION Bilateral 01/31/2018   Procedure: DENTAL RESTORATION/EXTRACTIONS;  Surgeon: Diona Browner, DDS;  Location: [facility];  Service: Oral Surgery;  Laterality: Bilateral;   TUBAL LIGATION     UPPER GI ENDOSCOPY N/A 02/15/2017   Procedure: UPPER GI ENDOSCOPY;  Surgeon: Clovis Riley, MD;  Location: [facility];  Service: General;  Laterality: N/A;    SocHx  reports that she has been smoking cigarettes.  She has a 29.00 pack-year smoking history. She has never used smokeless tobacco. She reports current drug use. Drug: Cocaine. She reports that she does not drink alcohol.  Allergies  Allergen Reactions   Nsaids Other (See Comments)    Stomach ulcer    Ketorolac Tromethamine Itching and Nausea And Vomiting    FamHx Family History  Problem Relation Age of Onset   Diabetes Mother    Hypertension Mother    Diabetes Father    Hypertension Father     Prior to Admission medications   Medication Sig Start Date End Date Taking? Authorizing Provider  acetaminophen (TYLENOL) 500 MG tablet Take 1,000 mg by mouth every 6 (six) hours as needed for mild pain. Patient not taking: Reported on 04/21/2021    [provider]  amLODipine (NORVASC) 10 MG tablet Take 1 tablet (10 mg total) by mouth daily. Patient not taking: Reported on 05/28/2021 04/21/21   Tedd Sias, PA  Ferrous Fumarate (IRON) 18 MG TBCR Take 1 tablet (18 mg total) by mouth 2 (two) times daily after a meal. Patient not taking: Reported on 04/18/2021 04/06/21   Domenic Polite, MD  hydrALAZINE (APRESOLINE) 50 MG tablet Take 1 tablet (50 mg total) by mouth 2 (two) times daily. Patient not taking: Reported on 04/18/2021 04/06/21   Domenic Polite, MD  hydrOXYzine (ATARAX/VISTARIL) 25 MG tablet Take 1 tablet (25 mg total) by mouth every 8 (eight) hours as needed for anxiety. Patient not taking: Reported on 04/18/2021 04/06/21   Domenic Polite, MD  lisinopril (ZESTRIL) 10 MG tablet Take 1 tablet (10 mg total) by mouth daily. Patient not taking: Reported on 05/23/2021 04/21/21   Tedd Sias, PA  methocarbamol (ROBAXIN) 750 MG tablet Take 1 tablet (750 mg total) by mouth every 8 (eight) hours as needed for muscle spasms. Patient not taking: Reported on 04/18/2021 04/06/21   Domenic Polite, MD  nicotine (NICODERM CQ - DOSED IN MG/24 HOURS) 21 mg/24hr patch Place 1 patch (21 mg total) onto the skin daily. Patient not  taking: Reported on 04/18/2021 04/07/21   Domenic Polite, MD  ondansetron (ZOFRAN-ODT) 4 MG disintegrating tablet Take 1 tablet (4 mg total) by mouth every 8 (eight) hours as needed for nausea or vomiting. Patient not taking: Reported on 05/23/2021 04/21/21   Tedd Sias, PA  pantoprazole (PROTONIX) 40 MG tablet Take 1 tablet (40 mg total) by mouth daily. Patient not taking: Reported on 05/23/2021 03/18/21   Edwin Dada, MD  traMADol (ULTRAM) 50 MG tablet Take 1 tablet (50 mg total) by mouth every 6 (six) hours as needed for moderate pain. Patient not taking: Reported on 04/18/2021 04/06/21   Domenic Polite, MD  vitamin B-12 (CYANOCOBALAMIN) 1000 MCG tablet Take 1 tablet (1,000 mcg total) by mouth daily. Patient not taking: Reported on 04/18/2021 04/06/21   Domenic Polite, MD    Physical Exam: Vitals:   05/28/21 0600 05/28/21 0630 05/28/21 0631 05/28/21 0803  BP: (!) 173/105 (!) 182/101 (!) 182/101 Marland Kitchen)  224/125  Pulse: 73  73 85  Resp: 20 18 17 16   Temp:    98.5 F (36.9 C)  TempSrc:    Oral  SpO2:   96%   Weight:      Height:        General: 45 y.o. female resting in bed in NAD Eyes: PERRL, normal sclera ENMT: Nares patent w/o discharge, orophaynx clear, dentition normal, ears w/o discharge/lesions/ulcers Neck: Supple, trachea midline Cardiovascular: RRR, +S1, S2, no m/g/r, equal pulses throughout Respiratory: CTABL, no w/r/r, normal WOB GI: BS+, NDNT, no masses noted, no organomegaly noted MSK: No c/c; RLE edema/erythema, multiple abrasions and punctures along BLE Neuro: A&O x 3, no focal deficits Psyc: Appropriate interaction and affect, calm/cooperative  Labs on Admission: I have personally reviewed following labs and imaging studies  CBC: Recent Labs  Lab 05/22/21 1610 05/23/21 0500 05/27/21 2021  WBC 20.6* 18.4* 9.5  NEUTROABS 19.4* 16.8* 5.5  HGB 10.8* 9.0* 10.3*  HCT 32.6* 26.8* 31.8*  MCV 71.6* 71.3* 73.4*  PLT 274 241 283   Basic  Metabolic Panel: Recent Labs  Lab 05/22/21 1610 05/22/21 2115 05/23/21 0500 05/23/21 1430 05/27/21 2021  NA 130*  --  128* 130* 133*  K 3.5  --  3.0* 3.2* 3.5  CL 95*  --  94* 96* 99  CO2 26  --  28 28 29   GLUCOSE 138*  --  93 87 96  BUN 17  --  18 17 14   CREATININE 0.76  --  0.74 0.61 0.72  CALCIUM 7.9*  --  7.8* 7.9* 8.6*  MG  --  1.7 1.8  --   --   PHOS  --  2.9 3.2  --   --    GFR: Estimated Creatinine Clearance: 74.7 mL/min (by C-G formula based on SCr of 0.72 mg/dL). Liver Function Tests: Recent Labs  Lab 05/22/21 2115 05/23/21 0500 05/27/21 2021  AST 23 20 13*  ALT 14 12 11   ALKPHOS 74 76 83  BILITOT 0.6 0.6 0.5  PROT 5.3* 5.3* 7.1  ALBUMIN 2.4* 2.3* 3.3*   No results for input(s): LIPASE, AMYLASE in the last 168 hours. No results for input(s): AMMONIA in the last 168 hours. Coagulation Profile: Recent Labs  Lab 05/22/21 1704  INR 1.1   Cardiac Enzymes: Recent Labs  Lab 05/22/21 2115  CKTOTAL 25*   BNP (last 3 results) No results for input(s): PROBNP in the last 8760 hours. HbA1C: No results for input(s): HGBA1C in the last 72 hours. CBG: No results for input(s): GLUCAP in the last 168 hours. Lipid Profile: No results for input(s): CHOL, HDL, LDLCALC, TRIG, CHOLHDL, LDLDIRECT in the last 72 hours. Thyroid Function Tests: No results for input(s): TSH, T4TOTAL, FREET4, T3FREE, THYROIDAB in the last 72 hours. Anemia Panel: No results for input(s): VITAMINB12, FOLATE, FERRITIN, TIBC, IRON, RETICCTPCT in the last 72 hours. Urine analysis:    Component Value Date/Time   COLORURINE YELLOW 05/22/2021 2117   APPEARANCEUR CLEAR 05/22/2021 2117   LABSPEC 1.009 05/22/2021 2117   PHURINE 6.0 05/22/2021 2117   GLUCOSEU NEGATIVE 05/22/2021 2117   HGBUR SMALL (A) 05/22/2021 2117   BILIRUBINUR NEGATIVE 05/22/2021 2117   [place] NEGATIVE 05/22/2021 2117   PROTEINUR NEGATIVE 05/22/2021 2117   UROBILINOGEN 1.0 02/09/2015 1228   NITRITE NEGATIVE 05/22/2021  2117   LEUKOCYTESUR LARGE (A) 05/22/2021 2117    Radiological Exams on Admission: CT Angio Chest PE W/Cm &/Or Wo Cm  Result Date: 05/28/2021 CLINICAL DATA:  Pulmonary embolism (PE) suspected, positive D-dimer. Dyspnea, cough, chest congestion EXAM: CT ANGIOGRAPHY CHEST WITH CONTRAST TECHNIQUE: Multidetector CT imaging of the chest was performed using the standard protocol during bolus administration of intravenous contrast. Multiplanar CT image reconstructions and MIPs were obtained to evaluate the vascular anatomy. CONTRAST:  34mL OMNIPAQUE IOHEXOL 350 MG/ML SOLN COMPARISON:  04/02/2021 FINDINGS: Cardiovascular: Adequate opacification of the pulmonary arterial tree. No intraluminal filling defect identified to suggest acute pulmonary embolism. Central pulmonary arteries are of normal caliber. Mild coronary artery calcification. Cardiac size within normal limits. Mild left ventricular hypertrophy noted. No pericardial effusion. Mild atherosclerotic calcification within the thoracic aorta. No aortic aneurysm. Mediastinum/Nodes: No enlarged mediastinal, hilar, or axillary lymph nodes. Thyroid gland, trachea, and esophagus demonstrate no significant findings. Lungs/Pleura: Mild interstitial pulmonary edema is present, possibly cardiogenic in nature. There is diffuse bronchial wall thickening noted which may relate to interstitial pulmonary edema or airway inflammation. Mild emphysema. Scattered areas of airway impaction are noted peripherally within the left lower lobe. No confluent pulmonary infiltrate. No pneumothorax or pleural effusion. 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 No acute abnormality. Musculoskeletal: No chest wall abnormality. No acute or significant osseous findings. Review of the MIP images confirms the above findings. IMPRESSION: No pulmonary embolism. Mild coronary artery calcification.  Left ventricular hypertrophy. Mild  interstitial pulmonary edema, possibly cardiogenic in nature. Mild emphysema. Scattered airway impaction and bronchial wall thickening likely reflecting changes of superimposed airway inflammation. Aortic Atherosclerosis (ICD10-I70.0) and Emphysema (ICD10-J43.9). Electronically Signed   By: Fidela Salisbury M.D.   On: 05/28/2021 01:23   DG Chest Port 1 View  Result Date: 05/27/2021 CLINICAL DATA:  Shortness of breath and congestion. EXAM: PORTABLE CHEST 1 VIEW COMPARISON:  Chest x-ray 04/13/2021. FINDINGS: The heart size and mediastinal contours are within normal limits. Both lungs are clear. The visualized skeletal structures are unremarkable. IMPRESSION: No active disease. Electronically Signed   By: Ronney Asters M.D.   On: 05/27/2021 23:59    EKG: Independently reviewed. Sinus, no st elevation  Assessment/Plan RLE cellulitis     - admit to inpt, med-surg     - vanc     - follow Bld Cx     - rpt CT leg w/ contrast  Chest pain and dyspnea COVID positive     - CTA chest was obtained d/t dyspnea and dyspnea and a elevated d-dimer     - CTA was notable for diffuse bronchial thickening: interstitial edema vs inflammatory airway; it was negative for clot     - EKG was ok, trp was negative     - positive for COVID, but outside window for anti-viral     - nebs, prednisone taper, O2 as necessary      HTN     - resume home regimen  Hyponatremia     - mild     - fluids, follow  Polysubstance abuse     - counseled against further use  DVT prophylaxis: lovenox  Code Status: FULL  Family Communication: w/ friend at bedside  Consults called: None   Status is: Inpatient  Remains inpatient appropriate because: severity of illness  Jonnie Finner DO [facility]  If 7PM-7AM, please contact night-coverage [email or web address]  05/28/2021, 9:12 AM

## 2021-05-28 NOTE — ED Notes (Signed)
CT tech entered pt's room to take pt to CT and pt stated to CT Tech, "I ain't doing no CT scan." Hospitalist notified. Will continue to monitor.

## 2021-05-28 NOTE — ED Notes (Signed)
Patient provided with lunch tray

## 2021-05-28 NOTE — ED Notes (Signed)
Pt's family member exited exam room and stated, "so what happens now?!" This nurse asked the family member what he was referring to? The family member stated, "so does she have a bed or what?!" This nurse explained to the family member that this nurse has no control over bed availability, and that the hospitalist has been paged regarding the pt's elevated BP and stating she would like to leave. The family member turned from nurse's station and returned back to exam room.

## 2021-05-28 NOTE — ED Notes (Signed)
Family member has approached the nurse's station twice now regarding sugar packets. Which he has been given at this time.

## 2021-05-28 NOTE — ED Notes (Signed)
Pt refusing CT scan at this time.  States "I'm not doing no CAT scan."  RN aware.

## 2021-05-28 NOTE — ED Notes (Signed)
Patient exited room and attempted to come behind nursing station, shouting "Y'all need to give me something to eat and drink and something for pain! I've been asking since six this morning and y'all have been ignoring me!" This Probation officer requested that patient return to her room due to being COVID+ and that pt would receive assistance when she returned to her room. Patient stated "I'm not going back to my room until I see you turn around and do what I'm telling you to do. You've been sitting on your asses all morning ignoring me." This Probation officer calmly reiterated the importance of patient returning to her room, apologized for the delay, explained the importance of needing an MD order for diet and meds, and reassured patient that her needs would be met now that orders have been put in. Patient continued to yell and swear at staff, stating "I've already seen the doctor but y'all are just ignoring me. I'm not doing anything until you actually do what I want." Security called, patient returned to room, and protocol shared with patient concerning staying in the room and keeping door closed. Will continue to monitor patient.

## 2021-05-28 NOTE — Progress Notes (Addendum)
Pharmacy Antibiotic Note  Terri [name] is a 46 y.o. female admitted on 05/27/2021 with cellulitis.  Pharmacy has been consulted for vancomycin dosing.  Plan: Vanc 1g x x at 0003 this AM, start vanc 1250mg  IV q24 at 2000 - goal AUC 400-550  Height: 5\' 9"  (175.3 cm) Weight: 54.4 kg (120 lb) IBW/kg (Calculated) : 66.2  Temp (24hrs), Avg:98.5 F (36.9 C), Min:98.5 F (36.9 C), Max:98.5 F (36.9 C)  Recent Labs  Lab 05/22/21 1610 05/22/21 1704 05/22/21 1857 05/22/21 2115 05/23/21 0500 05/23/21 1430 05/27/21 2021 05/27/21 2034 05/27/21 2355  WBC 20.6*  --   --   --  18.4*  --  9.5  --   --   CREATININE 0.76  --   --   --  0.74 0.61 0.72  --   --   LATICACIDVEN  --    < > 2.7* 1.1 0.8  --   --  0.8 0.7   < > = values in this interval not displayed.    Estimated Creatinine Clearance: 74.7 mL/min (by C-G formula based on SCr of 0.72 mg/dL).    Allergies  Allergen Reactions   Nsaids Other (See Comments)    Stomach ulcer    Ketorolac Tromethamine Itching and Nausea And Vomiting    Thank you for allowing pharmacy to be a part of this patients care.  Kara Mead 05/28/2021 10:03 AM

## 2021-05-29 LAB — COMPREHENSIVE METABOLIC PANEL
ALT: 9 U/L (ref 0–44)
AST: 13 U/L — ABNORMAL LOW (ref 15–41)
Albumin: 2.8 g/dL — ABNORMAL LOW (ref 3.5–5.0)
Alkaline Phosphatase: 77 U/L (ref 38–126)
Anion gap: 9 (ref 5–15)
BUN: 13 mg/dL (ref 6–20)
CO2: 26 mmol/L (ref 22–32)
Calcium: 8.7 mg/dL — ABNORMAL LOW (ref 8.9–10.3)
Chloride: 102 mmol/L (ref 98–111)
Creatinine, Ser: 0.6 mg/dL (ref 0.44–1.00)
GFR, Estimated: >60 mL/min (ref >60)
Glucose, Bld: 104 mg/dL — ABNORMAL HIGH (ref 70–99)
Potassium: 3.5 mmol/L (ref 3.5–5.1)
Sodium: 137 mmol/L (ref 135–145)
Total Bilirubin: 0.5 mg/dL (ref 0.3–1.2)
Total Protein: 6.3 g/dL — ABNORMAL LOW (ref 6.5–8.1)

## 2021-05-29 LAB — CBC WITH DIFFERENTIAL/PLATELET
Abs Immature Granulocytes: 0.06 10*3/uL (ref 0.00–0.07)
Basophils Absolute: 0 10*3/uL (ref 0.0–0.1)
Basophils Relative: 0 %
Eosinophils Absolute: 0 10*3/uL (ref 0.0–0.5)
Eosinophils Relative: 0 %
HCT: 31.5 % — ABNORMAL LOW (ref 36.0–46.0)
Hemoglobin: 10.4 g/dL — ABNORMAL LOW (ref 12.0–15.0)
Immature Granulocytes: 1 %
Lymphocytes Relative: 32 %
Lymphs Abs: 2.2 10*3/uL (ref 0.7–4.0)
MCH: 23.5 pg — ABNORMAL LOW (ref 26.0–34.0)
MCHC: 33 g/dL (ref 30.0–36.0)
MCV: 71.3 fL — ABNORMAL LOW (ref 80.0–100.0)
Monocytes Absolute: 0.4 10*3/uL (ref 0.1–1.0)
Monocytes Relative: 5 %
Neutro Abs: 4.3 10*3/uL (ref 1.7–7.7)
Neutrophils Relative %: 62 %
Platelets: 321 10*3/uL (ref 150–400)
RBC: 4.42 MIL/uL (ref 3.87–5.11)
RDW: 21.1 % — ABNORMAL HIGH (ref 11.5–15.5)
WBC: 6.9 10*3/uL (ref 4.0–10.5)
nRBC: 0 % (ref 0.0–0.2)

## 2021-05-29 LAB — C-REACTIVE PROTEIN: CRP: 1.2 mg/dL — ABNORMAL HIGH (ref <1.0)

## 2021-05-29 LAB — FERRITIN: Ferritin: 28 ng/mL (ref 11–307)

## 2021-05-29 LAB — D-DIMER, QUANTITATIVE: D-Dimer, Quant: 0.86 ug/mL-FEU — ABNORMAL HIGH (ref 0.00–0.50)

## 2021-05-29 MED ORDER — OXYCODONE HCL 5 MG PO TABS
5.0000 mg | ORAL_TABLET | ORAL | Status: DC | PRN
  Administered 2021-05-29: 10 mg via ORAL
  Filled 2021-05-29: qty 2

## 2021-05-29 MED ORDER — DOXYCYCLINE HYCLATE 50 MG PO CAPS
50.0000 mg | ORAL_CAPSULE | Freq: Two times a day (BID) | ORAL | 0 refills | Status: AC
Start: 2021-05-29 — End: 2021-06-04

## 2021-05-29 MED ORDER — AMLODIPINE BESYLATE 10 MG PO TABS: 10.0000 mg | ORAL_TABLET | Freq: Every day | ORAL | 2 refills | Status: DC

## 2021-05-29 MED ORDER — OXYCODONE HCL 5 MG PO TABS: 5.0000 mg | ORAL_TABLET | Freq: Four times a day (QID) | ORAL | 0 refills | Status: DC | PRN

## 2021-05-29 MED ORDER — ACETAMINOPHEN 500 MG PO TABS: 1000.0000 mg | ORAL_TABLET | Freq: Three times a day (TID) | ORAL | 0 refills | Status: DC | PRN

## 2021-05-29 MED ORDER — HYDRALAZINE HCL 50 MG PO TABS: 50.0000 mg | ORAL_TABLET | Freq: Two times a day (BID) | ORAL | 2 refills | Status: DC

## 2021-05-29 MED ORDER — LISINOPRIL 10 MG PO TABS: 10.0000 mg | ORAL_TABLET | Freq: Every day | ORAL | 2 refills | Status: DC

## 2021-05-29 NOTE — Progress Notes (Signed)
Patient refused BP to be taken. Pt stated to leave her alone, she wants to sleep now.

## 2021-05-29 NOTE — Progress Notes (Signed)
Nurse to room took out IV and explained to pt will go over dc instructions with patient shortly once her Dr. Awanda Mink her orders. Patient states ok.

## 2021-05-29 NOTE — Discharge Summary (Signed)
Physician Discharge Summary  Terri [name] HDQ:[id number] DOB: 19-Jan-1976 DOA: 05/27/2021  PCP: Pcp, No  Admit date: 05/27/2021 Discharge date: 05/29/2021  Admitted From: Home Disposition: Home  Recommendations for Outpatient Follow-up:  Follow up with PCP in 1 week Please follow up on the following pending results: None  Home Health: None Equipment/Devices: None  Discharge Condition: Stable CODE STATUS: Full code Diet recommendation: Heart healthy   Brief/Interim Summary:  Admission HPI written by Jonnie Finner, DO   HPI: Terri [name] is a 46 y.o. female with medical history significant of HTN, GERD, polysubstance abuse. Presenting with RLE swelling. She was seen in ED several days ago for the same. At the time she was evaluated by ortho. No surgical intervention needed at that time. She left AMA after a couple of doses of IV abx. She returned home and her leg worsened. She reports drainage from it off and on over the last several days. She reports fever and pain. When her symptoms did not improve, she decided to come to the ED for help. She denies any other aggravating or alleviating factors.    Hospital course:  Cellulitis Right lower extremity. Empiric Vancomycin IV started with improvement of symptoms and erythema. Transition to Doxycycline on discharge.  Chest pain Dyspnea Present on admission but resolved. CTA chest was negative for acute PE but was significant for bronchial thickening. Patient on room air and without symptoms morning of discharge.  Primary hypertension Hypertensive urgency Patient appears to be nonadherent with regimen. Resume home amlodipine, hydralazine and lisinopril; refills provided.  Hyponatremia Mild and has resolved.   Discharge Instructions   Allergies as of 05/29/2021       Reactions   Nsaids Other (See Comments)   Stomach ulcer   Ketorolac Tromethamine Itching, Nausea And Vomiting        Medication List     STOP taking  these medications    traMADol 50 MG tablet Commonly known as: ULTRAM       TAKE these medications    acetaminophen 500 MG tablet Commonly known as: TYLENOL Take 2 tablets (1,000 mg total) by mouth every 8 (eight) hours as needed for mild pain. What changed: when to take this   amLODipine 10 MG tablet Commonly known as: NORVASC Take 1 tablet (10 mg total) by mouth daily.   doxycycline 50 MG capsule Commonly known as: VIBRAMYCIN Take 1 capsule (50 mg total) by mouth 2 (two) times daily for 6 days.   hydrALAZINE 50 MG tablet Commonly known as: APRESOLINE Take 1 tablet (50 mg total) by mouth 2 (two) times daily.   hydrOXYzine 25 MG tablet Commonly known as: ATARAX Take 1 tablet (25 mg total) by mouth every 8 (eight) hours as needed for anxiety.   Iron 18 MG Tbcr Take 1 tablet (18 mg total) by mouth 2 (two) times daily after a meal.   lisinopril 10 MG tablet Commonly known as: ZESTRIL Take 1 tablet (10 mg total) by mouth daily.   methocarbamol 750 MG tablet Commonly known as: ROBAXIN Take 1 tablet (750 mg total) by mouth every 8 (eight) hours as needed for muscle spasms.   nicotine 21 mg/24hr patch Commonly known as: NICODERM CQ - dosed in mg/24 hours Place 1 patch (21 mg total) onto the skin daily.   ondansetron 4 MG disintegrating tablet Commonly known as: ZOFRAN-ODT Take 1 tablet (4 mg total) by mouth every 8 (eight) hours as needed for nausea or vomiting.   oxyCODONE  5 MG immediate release tablet Commonly known as: Oxy IR/ROXICODONE Take 1 tablet (5 mg total) by mouth every 6 (six) hours as needed for severe pain.   pantoprazole 40 MG tablet Commonly known as: PROTONIX Take 1 tablet (40 mg total) by mouth daily.   vitamin B-12 1000 MCG tablet Commonly known as: CYANOCOBALAMIN Take 1 tablet (1,000 mcg total) by mouth daily.        Allergies  Allergen Reactions   Nsaids Other (See Comments)    Stomach ulcer    Ketorolac Tromethamine Itching and  Nausea And Vomiting    Consultations: None   Procedures/Studies: DG Tibia/Fibula Right  Result Date: 05/22/2021 CLINICAL DATA:  Right lower extremity wound.  Redness and swelling. EXAM: RIGHT TIBIA AND FIBULA - 2 VIEW COMPARISON:  None. FINDINGS: Soft tissue swelling is noted along the anterior lower extremity. Soft tissue densities are likely vascular. No underlying osseous abnormality or soft tissue gas present. Knee and ankle joint are unremarkable. IMPRESSION: 1. Soft tissue swelling along the anterior lower extremity without underlying osseous abnormality. Findings consistent with cellulitis. 2. Soft tissue densities are likely vascular. Electronically Signed   By: San Morelle M.D.   On: 05/22/2021 18:45   CT Angio Chest PE W/Cm &/Or Wo Cm  Result Date: 05/28/2021 CLINICAL DATA:  Pulmonary embolism (PE) suspected, positive D-dimer. Dyspnea, cough, chest congestion EXAM: CT ANGIOGRAPHY CHEST WITH CONTRAST TECHNIQUE: Multidetector CT imaging of the chest was performed using the standard protocol during bolus administration of intravenous contrast. Multiplanar CT image reconstructions and MIPs were obtained to evaluate the vascular anatomy. CONTRAST:  61mL OMNIPAQUE IOHEXOL 350 MG/ML SOLN COMPARISON:  04/02/2021 FINDINGS: Cardiovascular: Adequate opacification of the pulmonary arterial tree. No intraluminal filling defect identified to suggest acute pulmonary embolism. Central pulmonary arteries are of normal caliber. Mild coronary artery calcification. Cardiac size within normal limits. Mild left ventricular hypertrophy noted. No pericardial effusion. Mild atherosclerotic calcification within the thoracic aorta. No aortic aneurysm. Mediastinum/Nodes: No enlarged mediastinal, hilar, or axillary lymph nodes. Thyroid gland, trachea, and esophagus demonstrate no significant findings. Lungs/Pleura: Mild interstitial pulmonary edema is present, possibly cardiogenic in nature. There is diffuse  bronchial wall thickening noted which may relate to interstitial pulmonary edema or airway inflammation. Mild emphysema. Scattered areas of airway impaction are noted peripherally within the left lower lobe. No confluent pulmonary infiltrate. No pneumothorax or pleural effusion. 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 No acute abnormality. Musculoskeletal: No chest wall abnormality. No acute or significant osseous findings. Review of the MIP images confirms the above findings. IMPRESSION: No pulmonary embolism. Mild coronary artery calcification.  Left ventricular hypertrophy. Mild interstitial pulmonary edema, possibly cardiogenic in nature. Mild emphysema. Scattered airway impaction and bronchial wall thickening likely reflecting changes of superimposed airway inflammation. Aortic Atherosclerosis (ICD10-I70.0) and Emphysema (ICD10-J43.9). Electronically Signed   By: Fidela Salisbury M.D.   On: 05/28/2021 01:23   CT Tibia Fibula Right Wo Contrast  Result Date: 05/22/2021 CLINICAL DATA:  Right leg wound, erythema for 1 week EXAM: CT OF THE LOWER RIGHT EXTREMITY WITHOUT CONTRAST TECHNIQUE: Multidetector CT imaging of the right lower extremity was performed according to the standard protocol. COMPARISON:  05/22/2021 FINDINGS: Bones/Joint/Cartilage There are no acute or destructive bony lesions. Specifically, no evidence of bony destruction or periosteal reaction to suggest osteomyelitis. 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 Mild osteoarthritis within the right hindfoot and midfoot. Ligaments Suboptimally assessed by CT. Muscles and Tendons No acute findings. Soft  tissues There is diffuse subcutaneous edema, greatest within the distal right lower leg proximal to the ankle. No evidence of fluid collection or abscess on this unenhanced exam. No subcutaneous gas. Scattered vascular calcifications are noted. Reconstructed images demonstrate no  additional findings. IMPRESSION: 1. Marked subcutaneous edema within the distal right lower leg. No evidence of fluid collection or abscess. 2. No acute or destructive bony lesions. No CT evidence of osteomyelitis. Electronically Signed   By: Randa Ngo M.D.   On: 05/22/2021 20:22   DG Chest Port 1 View  Result Date: 05/27/2021 CLINICAL DATA:  Shortness of breath and congestion. EXAM: PORTABLE CHEST 1 VIEW COMPARISON:  Chest x-ray 04/13/2021. FINDINGS: The heart size and mediastinal contours are within normal limits. Both lungs are clear. The visualized skeletal structures are unremarkable. IMPRESSION: No active disease. Electronically Signed   By: Ronney Asters M.D.   On: 05/27/2021 23:59   DG Chest Port 1 View  Result Date: 05/22/2021 CLINICAL DATA:  Questionable sepsis. EXAM: PORTABLE CHEST 1 VIEW COMPARISON:  Chest x-ray 04/18/2021. FINDINGS: Lungs are hyperinflated, unchanged. The heart size and mediastinal contours are within normal limits. Both lungs are clear. The visualized skeletal structures are unremarkable. IMPRESSION: No active disease. Electronically Signed   By: Ronney Asters M.D.   On: 05/22/2021 17:20   VAS [place] LOWER EXTREMITY VENOUS (DVT) (7a-7p)  Result Date: 05/23/2021  Lower Venous DVT Study Patient Name:  Terri [name]  Date of Exam:   05/22/2021 Medical Rec #: [id number]      Accession #:    [id number] Date of Birth: [birth year]      Patient Gender: F Patient Age:   68 years Exam Location:  [facility] Procedure:      VAS [place] LOWER EXTREMITY VENOUS (DVT) Referring Phys: Theodis Blaze --------------------------------------------------------------------------------  Indications: Right lower leg wound with drainage and associated warmth, erythema, and swelling. Per PA note, "I do not believe this to be DVT however given the swelling will obtain imaging.". Other Indications: Remote history of DVT 20+ years ago. Comparison Study: No recent prior studies. All studies on file  negative for                   DVT. Performing Technologist: Darlin Coco RDMS, RVT  Examination Guidelines: A complete evaluation includes B-mode imaging, spectral Doppler, color Doppler, and power Doppler as needed of all accessible portions of each vessel. Bilateral testing is considered an integral part of a complete examination. Limited examinations for reoccurring indications may be performed as noted. The reflux portion of the exam is performed with the patient in reverse Trendelenburg.  +---------+---------------+---------+-----------+----------+--------------+  RIGHT     Compressibility Phasicity Spontaneity Properties Thrombus Aging  +---------+---------------+---------+-----------+----------+--------------+  CFV       Full            Yes       Yes                                    +---------+---------------+---------+-----------+----------+--------------+  SFJ       Full                                                             +---------+---------------+---------+-----------+----------+--------------+  FV Prox   Full                                                             +---------+---------------+---------+-----------+----------+--------------+  FV Mid    Full                                                             +---------+---------------+---------+-----------+----------+--------------+  FV Distal Full                                                             +---------+---------------+---------+-----------+----------+--------------+  PFV       Full                                                             +---------+---------------+---------+-----------+----------+--------------+  POP       Full            Yes       Yes                                    +---------+---------------+---------+-----------+----------+--------------+  PTV       Full                                                             +---------+---------------+---------+-----------+----------+--------------+  PERO       Full                                                             +---------+---------------+---------+-----------+----------+--------------+   +----+---------------+---------+-----------+----------+--------------+  LEFT Compressibility Phasicity Spontaneity Properties Thrombus Aging  +----+---------------+---------+-----------+----------+--------------+  CFV  Full            Yes       Yes                                    +----+---------------+---------+-----------+----------+--------------+    Summary: RIGHT: - There is no evidence of deep vein thrombosis in the lower extremity.  - No cystic structure found in the popliteal fossa. - Ultrasound characteristics of enlarged lymph nodes are noted in the groin.  LEFT: - No evidence of common femoral vein obstruction.  *See table(s) above for  measurements and observations. Electronically signed by Monica Martinez MD on 05/23/2021 at 10:59:01 AM.    Final       Subjective: No issues this morning. Leg feels much better.  Discharge Exam: Vitals:   05/29/21 0613 05/29/21 0856  BP: (!) 181/118 (!) 187/111  Pulse: 87   Resp: 17   Temp: 98.2 F (36.8 C)   SpO2: 99%    Vitals:   05/28/21 1805 05/28/21 2148 05/29/21 0613 05/29/21 0856  BP: (!) 177/131 (!) 163/107 (!) 181/118 (!) 187/111  Pulse: 92 97 87   Resp: 20 17 17    Temp: 98.2 F (36.8 C) 98.5 F (36.9 C) 98.2 F (36.8 C)   TempSrc: Oral Oral Oral   SpO2: 99% 99% 99%   Weight:      Height:        General: Pt is alert, awake, not in acute distress Cardiovascular: RRR, S1/S2 +, no rubs, no gallops Respiratory: CTA bilaterally, no wheezing, no rhonchi Abdominal: Soft, NT, ND, bowel sounds + Extremities: no edema, no cyanosis Skin: right leg with improved erythema    The results of significant diagnostics from this hospitalization (including imaging, microbiology, ancillary and laboratory) are listed below for reference.     Microbiology: Recent Results (from the past 240  hour(s))  Blood Culture (routine x 2)     Status: None   Collection Time: 05/22/21  5:06 PM   Specimen: Right Antecubital; Blood  Result Value Ref Range Status   Specimen Description   Final    RIGHT ANTECUBITAL Performed at [facility] [address], [place], [place] [address]    Special Requests   Final    BOTTLES DRAWN AEROBIC AND ANAEROBIC Blood Culture adequate volume Performed at [facility] [address], [place], [place] [address]    Culture   Final    NO GROWTH 5 DAYS Performed at [facility], [place] [address] [place], [place] [address]    Report Status 05/27/2021 FINAL  Final  Resp Panel by RT-PCR (Flu A&B, Covid) Nasopharyngeal Swab     Status: Abnormal   Collection Time: 05/22/21  5:07 PM   Specimen: Nasopharyngeal Swab; Nasopharyngeal(NP) swabs in vial transport medium  Result Value Ref Range Status   SARS Coronavirus 2 by RT PCR POSITIVE (A) NEGATIVE Final    Comment: (NOTE) SARS-CoV-2 target nucleic acids are DETECTED.  The SARS-CoV-2 RNA is generally detectable in upper respiratory specimens during the acute phase of infection. Positive results are indicative of the presence of the identified virus, but do not rule out bacterial infection or co-infection with other pathogens not detected by the test. Clinical correlation with patient history and other diagnostic information is necessary to determine patient infection status. The expected result is Negative.  Fact Sheet for Patients: EntrepreneurPulse.com.au  Fact Sheet for Healthcare Providers: IncredibleEmployment.be  This test is not yet approved or cleared by the [place] FDA and  has been authorized for detection and/or diagnosis of SARS-CoV-2 by FDA under an Emergency Use Authorization (EUA).  This EUA will remain in effect (meaning this test can be used) for the duration of  the COVID-19 declaration under Section  564(b)(1) of the A ct, 21 U.S.C. section 360bbb-3(b)(1), unless the authorization is terminated or revoked sooner.     Influenza A by PCR NEGATIVE NEGATIVE Final   Influenza B by PCR NEGATIVE NEGATIVE Final    Comment: (NOTE) The Xpert Xpress SARS-CoV-2/FLU/RSV plus assay is intended as an  aid in the diagnosis of influenza from Nasopharyngeal swab specimens and should not be used as a sole basis for treatment. Nasal washings and aspirates are unacceptable for Xpert Xpress SARS-CoV-2/FLU/RSV testing.  Fact Sheet for Patients: EntrepreneurPulse.com.au  Fact Sheet for Healthcare Providers: IncredibleEmployment.be  This test is not yet approved or cleared by the [place] FDA and has been authorized for detection and/or diagnosis of SARS-CoV-2 by FDA under an Emergency Use Authorization (EUA). This EUA will remain in effect (meaning this test can be used) for the duration of the COVID-19 declaration under Section 564(b)(1) of the Act, 21 U.S.C. section 360bbb-3(b)(1), unless the authorization is terminated or revoked.  Performed at [facility], [place] [address] [place], [place] [address]   Blood Culture (routine x 2)     Status: None   Collection Time: 05/22/21  5:55 PM   Specimen: BLOOD  Result Value Ref Range Status   Specimen Description   Final    BLOOD RIGHT ANTECUBITAL Performed at [facility] [address] [place], [place] [address]    Special Requests   Final    BOTTLES DRAWN AEROBIC AND ANAEROBIC Blood Culture adequate volume Performed at [facility] [address] [place], [place] [address]    Culture   Final    NO GROWTH 5 DAYS Performed at [facility], [place] [address] [place], [place] [address]    Report Status 05/28/2021 FINAL  Final  Urine Culture     Status: None   Collection Time: 05/23/21  1:18 AM   Specimen: Urine, Clean Catch  Result Value Ref  Range Status   Specimen Description   Final    URINE, CLEAN CATCH Performed at [facility], [place] [address] [place], [place] [address]    Special Requests   Final    NONE Performed at [facility], [place] [address], [place], [place] [address]    Culture   Final    NO GROWTH Performed at [facility], [place] [address] [place], [place] [address]    Report Status 05/24/2021 FINAL  Final  Culture, blood (routine x 2)     Status: None (Preliminary result)   Collection Time: 05/27/21  8:35 PM   Specimen: BLOOD  Result Value Ref Range Status   Specimen Description   Final    BLOOD LEFT ANTECUBITAL Performed at [facility] [address], [place], [place] [address]    Special Requests   Final    BOTTLES DRAWN AEROBIC AND ANAEROBIC Blood Culture results may not be optimal due to an inadequate volume of blood received in culture bottles Performed at [facility] [address] [place], [place] [address]    Culture   Final    NO GROWTH 2 DAYS Performed at [facility] [address], [place], [place] [address]    Report Status PENDING  Incomplete     Labs: BNP (last 3 results) Recent Labs    05/27/21 2355  BNP 76.1   Basic Metabolic Panel: Recent Labs  Lab 05/22/21 1610 05/22/21 2115 05/23/21 0500 05/23/21 1430 05/27/21 2021 05/28/21 1240 05/29/21 0346  NA 130*  --  128* 130* 133*  --  137  K 3.5  --  3.0* 3.2* 3.5  --  3.5  CL 95*  --  94* 96* 99  --  102  CO2 26  --  28 28 29   --  26  GLUCOSE 138*  --  93 87 96  --  104*  BUN 17  --  18 17 14   --  13  CREATININE 0.76  --  0.74 0.61 0.72 0.74 0.60  CALCIUM 7.9*  --  7.8* 7.9* 8.6*  --  8.7*  MG  --  1.7 1.8  --   --   --   --   PHOS  --  2.9 3.2  --   --   --   --    Liver Function Tests: Recent Labs  Lab 05/22/21 2115 05/23/21 0500 05/27/21 2021 05/29/21 0346  AST 23 20 13* 13*  ALT 14 12 11 9   ALKPHOS 74 76 83 77   BILITOT 0.6 0.6 0.5 0.5  PROT 5.3* 5.3* 7.1 6.3*  ALBUMIN 2.4* 2.3* 3.3* 2.8*   No results for input(s): LIPASE, AMYLASE in the last 168 hours. No results for input(s): AMMONIA in the last 168 hours. CBC: Recent Labs  Lab 05/22/21 1610 05/23/21 0500 05/27/21 2021 05/28/21 1240 05/29/21 0346  WBC 20.6* 18.4* 9.5 5.9 6.9  NEUTROABS 19.4* 16.8* 5.5  --  4.3  HGB 10.8* 9.0* 10.3* 10.4* 10.4*  HCT 32.6* 26.8* 31.8* 32.2* 31.5*  MCV 71.6* 71.3* 73.4* 72.2* 71.3*  PLT 274 241 291 290 321   Cardiac Enzymes: Recent Labs  Lab 05/22/21 2115  CKTOTAL 25*   BNP: Invalid input(s): POCBNP CBG: No results for input(s): GLUCAP in the last 168 hours. D-Dimer Recent Labs    05/29/21 0346  DDIMER 0.86*   Hgb A1c No results for input(s): HGBA1C in the last 72 hours. Lipid Profile No results for input(s): CHOL, HDL, LDLCALC, TRIG, CHOLHDL, LDLDIRECT in the last 72 hours. Thyroid function studies No results for input(s): TSH, T4TOTAL, T3FREE, THYROIDAB in the last 72 hours.  Invalid input(s): FREET3 Anemia work up Recent Labs    05/29/21 0346  FERRITIN 28   Urinalysis    Component Value Date/Time   COLORURINE YELLOW 05/22/2021 2117   APPEARANCEUR CLEAR 05/22/2021 2117   LABSPEC 1.009 05/22/2021 2117   PHURINE 6.0 05/22/2021 2117   GLUCOSEU NEGATIVE 05/22/2021 2117   HGBUR SMALL (A) 05/22/2021 2117   BILIRUBINUR NEGATIVE 05/22/2021 2117   KETONESUR NEGATIVE 05/22/2021 2117   PROTEINUR NEGATIVE 05/22/2021 2117   UROBILINOGEN 1.0 02/09/2015 1228   NITRITE NEGATIVE 05/22/2021 2117   LEUKOCYTESUR LARGE (A) 05/22/2021 2117   Sepsis Labs Invalid input(s): PROCALCITONIN,  WBC,  LACTICIDVEN Microbiology Recent Results (from the past 240 hour(s))  Blood Culture (routine x 2)     Status: None   Collection Time: 05/22/21  5:06 PM   Specimen: Right Antecubital; Blood  Result Value Ref Range Status   Specimen Description   Final    RIGHT ANTECUBITAL Performed at [facility], [place] [address] [place], [place] [address]    Special Requests   Final    BOTTLES DRAWN AEROBIC AND ANAEROBIC Blood Culture adequate volume Performed at [facility] [address] [place], [place] [address]    Culture   Final    NO GROWTH 5 DAYS Performed at [facility], [place] [address] [place], [place] [address]    Report Status 05/27/2021 FINAL  Final  Resp Panel by RT-PCR (Flu A&B, Covid) Nasopharyngeal Swab     Status: Abnormal   Collection Time: 05/22/21  5:07 PM   Specimen: Nasopharyngeal Swab; Nasopharyngeal(NP) swabs in vial transport medium  Result Value Ref Range Status  SARS Coronavirus 2 by RT PCR POSITIVE (A) NEGATIVE Final    Comment: (NOTE) SARS-CoV-2 target nucleic acids are DETECTED.  The SARS-CoV-2 RNA is generally detectable in upper respiratory specimens during the acute phase of infection. Positive results are indicative of the presence of the identified virus, but do not rule out bacterial infection or co-infection with other pathogens not detected by the test. Clinical correlation with patient history and other diagnostic information is necessary to determine patient infection status. The expected result is Negative.  Fact Sheet for Patients: EntrepreneurPulse.com.au  Fact Sheet for Healthcare Providers: IncredibleEmployment.be  This test is not yet approved or cleared by the [place] FDA and  has been authorized for detection and/or diagnosis of SARS-CoV-2 by FDA under an Emergency Use Authorization (EUA).  This EUA will remain in effect (meaning this test can be used) for the duration of  the COVID-19 declaration under Section 564(b)(1) of the A ct, 21 U.S.C. section 360bbb-3(b)(1), unless the authorization is terminated or revoked sooner.     Influenza A by PCR NEGATIVE NEGATIVE Final   Influenza B by PCR NEGATIVE NEGATIVE Final    Comment: (NOTE) The Xpert  Xpress SARS-CoV-2/FLU/RSV plus assay is intended as an aid in the diagnosis of influenza from Nasopharyngeal swab specimens and should not be used as a sole basis for treatment. Nasal washings and aspirates are unacceptable for Xpert Xpress SARS-CoV-2/FLU/RSV testing.  Fact Sheet for Patients: EntrepreneurPulse.com.au  Fact Sheet for Healthcare Providers: IncredibleEmployment.be  This test is not yet approved or cleared by the [place] FDA and has been authorized for detection and/or diagnosis of SARS-CoV-2 by FDA under an Emergency Use Authorization (EUA). This EUA will remain in effect (meaning this test can be used) for the duration of the COVID-19 declaration under Section 564(b)(1) of the Act, 21 U.S.C. section 360bbb-3(b)(1), unless the authorization is terminated or revoked.  Performed at [facility], [place] [address], [place], [place] [address]   Blood Culture (routine x 2)     Status: None   Collection Time: 05/22/21  5:55 PM   Specimen: BLOOD  Result Value Ref Range Status   Specimen Description   Final    BLOOD RIGHT ANTECUBITAL Performed at [facility] [address], [place], [place] [address]    Special Requests   Final    BOTTLES DRAWN AEROBIC AND ANAEROBIC Blood Culture adequate volume Performed at [facility] [address], [place], [place] [address]    Culture   Final    NO GROWTH 5 DAYS Performed at [facility], [place] [address], [place], [place] [address]    Report Status 05/28/2021 FINAL  Final  Urine Culture     Status: None   Collection Time: 05/23/21  1:18 AM   Specimen: Urine, Clean Catch  Result Value Ref Range Status   Specimen Description   Final    URINE, CLEAN CATCH Performed at [facility], [place] [address] [place], [place] [address]    Special Requests   Final    NONE Performed at [facility],  [place] [address], [place], [place] [address]    Culture   Final    NO GROWTH Performed at [facility], [place] [address], [place], [place] [address]    Report Status 05/24/2021 FINAL  Final  Culture, blood (routine x 2)     Status: None (Preliminary result)   Collection Time: 05/27/21  8:35 PM  Specimen: BLOOD  Result Value Ref Range Status   Specimen Description   Final    BLOOD LEFT ANTECUBITAL Performed at [facility] [address], [place], [place] [address]    Special Requests   Final    BOTTLES DRAWN AEROBIC AND ANAEROBIC Blood Culture results may not be optimal due to an inadequate volume of blood received in culture bottles Performed at [facility] [address], [place], [place] [address]    Culture   Final    NO GROWTH 2 DAYS Performed at [facility] [address] [place], [place] [address]    Report Status PENDING  Incomplete     Time coordinating discharge: 35 minutes  SIGNED:   Cordelia Poche, MD [facility] 05/29/2021, 10:26 AM

## 2021-05-29 NOTE — Discharge Instructions (Signed)
[name],  You were in the hospital with cellulitis. Please continue to take the prescribed antibiotics to ensure complete resolution of your infection.

## 2021-05-29 NOTE — Progress Notes (Addendum)
Message Dr. Lonny Prude due to unable to print out dc instructions for pt.

## 2021-05-29 NOTE — Progress Notes (Addendum)
Staff noted pt leaving floor. Explained to pt wait on dc instructions. Patient states she didn't have to it last time and she is leaving now. Paged Dr. Lonny Prude.

## 2021-05-29 NOTE — Plan of Care (Signed)
  Problem: Activity: Goal: Risk for activity intolerance will decrease Outcome: Progressing   Problem: Pain Managment: Goal: General experience of comfort will improve Outcome: Progressing   Problem: Safety: Goal: Ability to remain free from injury will improve Outcome: Progressing   

## 2021-05-29 NOTE — Progress Notes (Signed)
Transition of Care [facility]) Screening Note  Patient Details  Name: [name] Date of Birth: 07-20-1975  Transition of Care [facility]) CM/SW Contact:    Sherie Don, LCSW Phone Number: 05/29/2021, 12:47 PM  Transition of Care Department [facility]) has reviewed patient and no TOC needs have been identified at this time. We will continue to monitor patient advancement through interdisciplinary progression rounds. If new patient transition needs arise, please place a TOC consult.

## 2021-05-29 NOTE — Progress Notes (Signed)
Patient left without dc instructions and Dr. Lonny Prude is aware of.

## 2021-05-30 ENCOUNTER — Encounter (HOSPITAL_COMMUNITY): Payer: Self-pay

## 2021-05-30 ENCOUNTER — Emergency Department (HOSPITAL_COMMUNITY)
Admission: EM | Admit: 2021-05-30 | Discharge: 2021-05-30 | Disposition: A | Discharge status: Home / Self Care | Payer: Medicaid Other | Attending: Emergency Medicine | Admitting: Emergency Medicine

## 2021-05-30 ENCOUNTER — Other Ambulatory Visit: Payer: Self-pay

## 2021-05-30 ENCOUNTER — Emergency Department (HOSPITAL_COMMUNITY): Payer: Medicaid Other

## 2021-05-30 DIAGNOSIS — R0781 Pleurodynia: Secondary | ICD-10-CM | POA: Diagnosis not present

## 2021-05-30 DIAGNOSIS — Z79899 Other long term (current) drug therapy: Secondary | ICD-10-CM | POA: Insufficient documentation

## 2021-05-30 DIAGNOSIS — I10 Essential (primary) hypertension: Secondary | ICD-10-CM | POA: Diagnosis not present

## 2021-05-30 DIAGNOSIS — R0602 Shortness of breath: Secondary | ICD-10-CM

## 2021-05-30 LAB — CBC WITH DIFFERENTIAL/PLATELET
Abs Immature Granulocytes: 0.03 10*3/uL (ref 0.00–0.07)
Basophils Absolute: 0 10*3/uL (ref 0.0–0.1)
Basophils Relative: 0 %
Eosinophils Absolute: 0 10*3/uL (ref 0.0–0.5)
Eosinophils Relative: 0 %
HCT: 32.2 % — ABNORMAL LOW (ref 36.0–46.0)
Hemoglobin: 10.7 g/dL — ABNORMAL LOW (ref 12.0–15.0)
Immature Granulocytes: 0 %
Lymphocytes Relative: 22 %
Lymphs Abs: 2.3 10*3/uL (ref 0.7–4.0)
MCH: 23.5 pg — ABNORMAL LOW (ref 26.0–34.0)
MCHC: 33.2 g/dL (ref 30.0–36.0)
MCV: 70.8 fL — ABNORMAL LOW (ref 80.0–100.0)
Monocytes Absolute: 0.4 10*3/uL (ref 0.1–1.0)
Monocytes Relative: 4 %
Neutro Abs: 7.4 10*3/uL (ref 1.7–7.7)
Neutrophils Relative %: 74 %
Platelets: 427 10*3/uL — ABNORMAL HIGH (ref 150–400)
RBC: 4.55 MIL/uL (ref 3.87–5.11)
RDW: 21.8 % — ABNORMAL HIGH (ref 11.5–15.5)
WBC: 10.1 10*3/uL (ref 4.0–10.5)
nRBC: 0 % (ref 0.0–0.2)

## 2021-05-30 LAB — TROPONIN I (HIGH SENSITIVITY)
Troponin I (High Sensitivity): 10 ng/L (ref <18)
Troponin I (High Sensitivity): 9 ng/L (ref <18)

## 2021-05-30 LAB — COMPREHENSIVE METABOLIC PANEL
ALT: 13 U/L (ref 0–44)
AST: 20 U/L (ref 15–41)
Albumin: 3 g/dL — ABNORMAL LOW (ref 3.5–5.0)
Alkaline Phosphatase: 75 U/L (ref 38–126)
Anion gap: 6 (ref 5–15)
BUN: 16 mg/dL (ref 6–20)
CO2: 23 mmol/L (ref 22–32)
Calcium: 8.1 mg/dL — ABNORMAL LOW (ref 8.9–10.3)
Chloride: 102 mmol/L (ref 98–111)
Creatinine, Ser: 0.65 mg/dL (ref 0.44–1.00)
GFR, Estimated: >60 mL/min (ref >60)
Glucose, Bld: 80 mg/dL (ref 70–99)
Potassium: 3.7 mmol/L (ref 3.5–5.1)
Sodium: 131 mmol/L — ABNORMAL LOW (ref 135–145)
Total Bilirubin: 1 mg/dL (ref 0.3–1.2)
Total Protein: 6.8 g/dL (ref 6.5–8.1)

## 2021-05-30 LAB — LIPASE, BLOOD: Lipase: 49 U/L (ref 11–51)

## 2021-05-30 MED ORDER — HYDRALAZINE HCL 50 MG PO TABS: 50.0000 mg | ORAL_TABLET | Freq: Two times a day (BID) | ORAL | 0 refills | Status: DC

## 2021-05-30 MED ORDER — LISINOPRIL 10 MG PO TABS
10.0000 mg | ORAL_TABLET | Freq: Once | ORAL | Status: AC
  Administered 2021-05-30: 10 mg via ORAL
  Filled 2021-05-30: qty 1

## 2021-05-30 MED ORDER — IOHEXOL 350 MG/ML SOLN
75.0000 mL | Freq: Once | INTRAVENOUS | Status: AC | PRN
  Administered 2021-05-30: 75 mL via INTRAVENOUS

## 2021-05-30 MED ORDER — METHYLPREDNISOLONE SODIUM SUCC 125 MG IJ SOLR
125.0000 mg | Freq: Once | INTRAMUSCULAR | Status: AC
  Administered 2021-05-30: 125 mg via INTRAVENOUS
  Filled 2021-05-30: qty 2

## 2021-05-30 MED ORDER — ONDANSETRON HCL 4 MG/2ML IJ SOLN
4.0000 mg | Freq: Once | INTRAMUSCULAR | Status: AC
  Administered 2021-05-30: 4 mg via INTRAVENOUS
  Filled 2021-05-30: qty 2

## 2021-05-30 MED ORDER — LISINOPRIL 10 MG PO TABS: 10.0000 mg | ORAL_TABLET | Freq: Every day | ORAL | 0 refills | Status: DC

## 2021-05-30 MED ORDER — HYDRALAZINE HCL 25 MG PO TABS
50.0000 mg | ORAL_TABLET | Freq: Once | ORAL | Status: AC
  Administered 2021-05-30: 50 mg via ORAL
  Filled 2021-05-30: qty 2

## 2021-05-30 MED ORDER — AMLODIPINE BESYLATE 5 MG PO TABS
10.0000 mg | ORAL_TABLET | Freq: Once | ORAL | Status: AC
  Administered 2021-05-30: 10 mg via ORAL
  Filled 2021-05-30: qty 2

## 2021-05-30 MED ORDER — MORPHINE SULFATE (PF) 4 MG/ML IV SOLN
4.0000 mg | Freq: Once | INTRAVENOUS | Status: AC
  Administered 2021-05-30: 4 mg via INTRAVENOUS
  Filled 2021-05-30: qty 1

## 2021-05-30 MED ORDER — ACETAMINOPHEN 500 MG PO TABS
1000.0000 mg | ORAL_TABLET | Freq: Once | ORAL | Status: AC
  Administered 2021-05-30: 1000 mg via ORAL
  Filled 2021-05-30: qty 2

## 2021-05-30 MED ORDER — ALBUTEROL SULFATE HFA 108 (90 BASE) MCG/ACT IN AERS
4.0000 | INHALATION_SPRAY | Freq: Once | RESPIRATORY_TRACT | Status: AC
  Administered 2021-05-30: 4 via RESPIRATORY_TRACT
  Filled 2021-05-30: qty 6.7

## 2021-05-30 MED ORDER — AMLODIPINE BESYLATE 10 MG PO TABS: 10.0000 mg | ORAL_TABLET | Freq: Every day | ORAL | 0 refills | Status: DC

## 2021-05-30 MED ORDER — OXYCODONE HCL 5 MG PO TABS
5.0000 mg | ORAL_TABLET | Freq: Once | ORAL | Status: AC
  Administered 2021-05-30: 5 mg via ORAL
  Filled 2021-05-30: qty 1

## 2021-05-30 NOTE — ED Triage Notes (Addendum)
Pt reports shortness of breath since yesterday with pain on inspiration. Covid + 8 days ago.

## 2021-05-30 NOTE — ED Provider Notes (Signed)
Accepted handoff at shift change from [facility]. Please see prior provider note for more detail.   Briefly: Patient is 46 y.o.   DDX: concern for shortness of breath, upper abdominal pain, productive cough.  Recently diagnosed with COVID.  Being worked up for ACS, PE, other intrathoracic abnormality.  Plan: At time of handoff patient with negative CTA of the chest, negative troponin x1, normal chest x-ray.  She has been hypertensive, but has received her home meds.  She is hypertensive because she has not been taking her home medications.  No blurry vision, focal neurologic deficits.  Plan to DC after delta troponin provided it is negative. Delta troponin is 9.  Prior to ability to reevaluate patient she has left AMA.  Her work-up was negative at this time other than her persistent hypertension, although her blood pressure was decreased to 294 systolic prior to leaving AMA.     Anselmo Pickler, PA-C 05/30/21 1637    Daleen Bo, MD 05/31/21 [phone]

## 2021-05-30 NOTE — ED Provider Notes (Signed)
[facility] DEPT Provider Note   CSN: [id number] Arrival date & time: 05/30/21  0425     History  Chief Complaint  Patient presents with   Shortness of Breath    Terri [name] is a 46 y.o. female.  Terri [name] is a 46 y.o. female with history of hypertension, DVT, chronic back pain, migraines, bipolar disorder, polysubstance abuse, who presents for evaluation of chest pain and shortness of breath.  Patient reports symptoms started yesterday shortly after she was discharged from the hospital.  She reports she started feeling increasingly short of breath and reports left-sided chest pain that is worse on inspiration.  She also reports some associated upper abdominal pain.  She has been having a productive cough.  No fevers or chills.  Was diagnosed with COVID 8 days ago. Patient was discharged from the hospital yesterday after admission for IV antibiotics for right lower extremity cellulitis, at the time was also treated for chest pain and dyspnea, CTA of the chest was negative for PE but did show bronchial thickening.  Patient was also treated for hypertensive urgency.  Has not been taking any of her medications because she reports she is unable to afford them.  Patient reports cellulitis of the right leg has improved significantly and is no longer causing her any pain.  The history is provided by the patient.        Home Medications Prior to Admission medications   Medication Sig Start Date End Date Taking? Authorizing Provider  acetaminophen (TYLENOL) 500 MG tablet Take 2 tablets (1,000 mg total) by mouth every 8 (eight) hours as needed for mild pain. 05/29/21   Mariel Aloe, MD  amLODipine (NORVASC) 10 MG tablet Take 1 tablet (10 mg total) by mouth daily. 05/29/21 08/27/21  Mariel Aloe, MD  doxycycline (VIBRAMYCIN) 50 MG capsule Take 1 capsule (50 mg total) by mouth 2 (two) times daily for 6 days. 05/29/21 06/04/21  Mariel Aloe, MD  Ferrous Fumarate  (IRON) 18 MG TBCR Take 1 tablet (18 mg total) by mouth 2 (two) times daily after a meal. Patient not taking: Reported on 04/18/2021 04/06/21   Domenic Polite, MD  hydrALAZINE (APRESOLINE) 50 MG tablet Take 1 tablet (50 mg total) by mouth 2 (two) times daily. 05/29/21 08/27/21  Mariel Aloe, MD  hydrOXYzine (ATARAX/VISTARIL) 25 MG tablet Take 1 tablet (25 mg total) by mouth every 8 (eight) hours as needed for anxiety. Patient not taking: Reported on 04/18/2021 04/06/21   Domenic Polite, MD  lisinopril (ZESTRIL) 10 MG tablet Take 1 tablet (10 mg total) by mouth daily. 05/29/21 08/27/21  Mariel Aloe, MD  methocarbamol (ROBAXIN) 750 MG tablet Take 1 tablet (750 mg total) by mouth every 8 (eight) hours as needed for muscle spasms. Patient not taking: Reported on 04/18/2021 04/06/21   Domenic Polite, MD  nicotine (NICODERM CQ - DOSED IN MG/24 HOURS) 21 mg/24hr patch Place 1 patch (21 mg total) onto the skin daily. Patient not taking: Reported on 04/18/2021 04/07/21   Domenic Polite, MD  ondansetron (ZOFRAN-ODT) 4 MG disintegrating tablet Take 1 tablet (4 mg total) by mouth every 8 (eight) hours as needed for nausea or vomiting. Patient not taking: Reported on 05/23/2021 04/21/21   Tedd Sias, PA  oxyCODONE (OXY IR/ROXICODONE) 5 MG immediate release tablet Take 1 tablet (5 mg total) by mouth every 6 (six) hours as needed for severe pain. 05/29/21   Mariel Aloe, MD  pantoprazole (Triana)  40 MG tablet Take 1 tablet (40 mg total) by mouth daily. Patient not taking: Reported on 05/23/2021 03/18/21   Edwin Dada, MD  vitamin B-12 (CYANOCOBALAMIN) 1000 MCG tablet Take 1 tablet (1,000 mcg total) by mouth daily. Patient not taking: Reported on 04/18/2021 04/06/21   Domenic Polite, MD      Allergies    Nsaids and Ketorolac tromethamine    Review of Systems   Review of Systems  Constitutional:  Negative for chills and fever.  HENT: Negative.    Respiratory:  Positive for cough,  shortness of breath and wheezing.   Cardiovascular:  Positive for chest pain. Negative for leg swelling.  Gastrointestinal:  Positive for abdominal pain. Negative for diarrhea, nausea and vomiting.  Genitourinary:  Negative for dysuria and frequency.  Musculoskeletal:  Positive for back pain. Negative for arthralgias and myalgias.  Skin:  Negative for color change and rash.  Neurological:  Negative for dizziness, syncope and light-headedness.  All other systems reviewed and are negative.  Physical Exam Updated Vital Signs BP (!) 203/125    Pulse 91    Temp 97.8 F (36.6 C) (Oral)    Resp 12    Ht 5\' 9"  (1.753 m)    Wt 54.4 kg    SpO2 97%    BMI 17.72 kg/m  Physical Exam Vitals and nursing note reviewed.  Constitutional:      General: She is not in acute distress.    Appearance: Normal appearance. She is well-developed. She is ill-appearing. She is not diaphoretic.  HENT:     Head: Normocephalic and atraumatic.  Eyes:     General:        Right eye: No discharge.        Left eye: No discharge.     Pupils: Pupils are equal, round, and reactive to light.  Cardiovascular:     Rate and Rhythm: Normal rate and regular rhythm.     Pulses: Normal pulses.     Heart sounds: Normal heart sounds.  Pulmonary:     Effort: Pulmonary effort is normal. No respiratory distress.     Breath sounds: Decreased breath sounds and wheezing present. No rales.     Comments: Respirations are equal and unlabored and patient is satting at 97% on room air, reports pain with inspiration, on auscultation she has expiratory wheezing bilaterally with some decreased air movement, no rales or rhonchi. Chest:     Chest wall: Tenderness present.     Comments: Chest wall is tender to palpation Abdominal:     General: Bowel sounds are normal. There is no distension.     Palpations: Abdomen is soft. There is no mass.     Tenderness: There is abdominal tenderness. There is no guarding.     Comments: Abdomen soft,  nondistended, mild epigastric tenderness present, all other quadrants nontender, no guarding or peritoneal signs  Musculoskeletal:        General: No deformity.     Cervical back: Neck supple.     Right lower leg: No tenderness. No edema.     Left lower leg: No tenderness. No edema.  Skin:    General: Skin is warm and dry.     Capillary Refill: Capillary refill takes less than 2 seconds.  Neurological:     Mental Status: She is alert and oriented to person, place, and time.     Coordination: Coordination normal.     Comments: Speech is clear, able to follow commands Moves extremities without  ataxia, coordination intact  Psychiatric:        Mood and Affect: Mood normal.        Behavior: Behavior normal.    ED Results / Procedures / Treatments   Labs (all labs ordered are listed, but only abnormal results are displayed) Labs Reviewed  CBC WITH DIFFERENTIAL/PLATELET - Abnormal; Notable for the following components:      Result Value   Hemoglobin 10.7 (*)    HCT 32.2 (*)    MCV 70.8 (*)    MCH 23.5 (*)    RDW 21.8 (*)    Platelets 427 (*)    All other components within normal limits  COMPREHENSIVE METABOLIC PANEL - Abnormal; Notable for the following components:   Sodium 131 (*)    Calcium 8.1 (*)    Albumin 3.0 (*)    All other components within normal limits  LIPASE, BLOOD  TROPONIN I (HIGH SENSITIVITY)  TROPONIN I (HIGH SENSITIVITY)    EKG EKG Interpretation  Date/Time:  Saturday May 30 2021 05:10:47 EST Ventricular Rate:  89 PR Interval:  142 QRS Duration: 82 QT Interval:  368 QTC Calculation: 448 R Axis:   74 Text Interpretation: Sinus rhythm Biatrial enlargement Probable left ventricular hypertrophy Similar to prior tracings from Jan '23 and Dec '22 Confirmed by Nanda Quinton [phone]) on 05/30/2021 2:35:23 PM  Radiology CT Angio Chest PE W/Cm &/Or Wo Cm  Result Date: 05/30/2021 CLINICAL DATA:  Short of breath with chest pain on inspiration. COVID-19 infection  8 days ago. EXAM: CT ANGIOGRAPHY CHEST WITH CONTRAST TECHNIQUE: Multidetector CT imaging of the chest was performed using the standard protocol during bolus administration of intravenous contrast. Multiplanar CT image reconstructions and MIPs were obtained to evaluate the vascular anatomy. CONTRAST:  50mL OMNIPAQUE IOHEXOL 350 MG/ML SOLN COMPARISON:  05/28/2021. FINDINGS: Cardiovascular: Central pulmonary arteries are well opacified. Mild heterogeneous enhancement segmental and smaller pulmonary vessels which, along with some respiratory motion, mildly limits the study. Allowing for this mild limitation, there are no convincing pulmonary emboli. Heart normal in size. No pericardial effusion. Mild three-vessel coronary artery calcifications. Great vessels are normal in caliber. No aortic dissection. Mild descending thoracic aortic atherosclerosis. Arch branch vessels are widely patent. Mediastinum/Nodes: No enlarged mediastinal, hilar, or axillary lymph nodes. Thyroid gland, trachea, and esophagus demonstrate no significant findings. Lungs/Pleura: Lungs are clear. No pleural effusion or pneumothorax. Upper Abdomen: No acute findings. Musculoskeletal: No fracture or acute finding. No bone lesion. No chest wall masses. Review of the MIP images confirms the above findings. IMPRESSION: 1. No evidence of a pulmonary embolism. Study mildly limited for assessment segmental and smaller pulmonary artery emboli. 2. No acute findings.  Clear lungs. Aortic Atherosclerosis (ICD10-I70.0). Electronically Signed   By: Lajean Manes M.D.   On: 05/30/2021 14:51   DG Chest Port 1 View  Result Date: 05/30/2021 CLINICAL DATA:  45 year old female recently positive for COVID-19. Shortness of breath. EXAM: PORTABLE CHEST 1 VIEW COMPARISON:  CTA chest 05/28/2021 and earlier. FINDINGS: Portable AP semi upright view at 0523 hours. Emphysema on the recent CT. Stable large lung volumes. Mediastinal contours remain within normal limits.  Visualized tracheal air column is within normal limits. Allowing for portable technique the lungs are clear. No pneumothorax or pleural effusion. No acute osseous abnormality identified. IMPRESSION: Emphysema.  No acute cardiopulmonary abnormality. Electronically Signed   By: Genevie Ann M.D.   On: 05/30/2021 05:50    Procedures Procedures    Medications Ordered in ED Medications  lisinopril (ZESTRIL)  tablet 10 mg (has no administration in time range)  morphine 4 MG/ML injection 4 mg (4 mg Intravenous Given 05/30/21 1342)  ondansetron (ZOFRAN) injection 4 mg (4 mg Intravenous Given 05/30/21 1340)  albuterol (VENTOLIN HFA) 108 (90 Base) MCG/ACT inhaler 4 puff (4 puffs Inhalation Given 05/30/21 1340)  methylPREDNISolone sodium succinate (SOLU-MEDROL) 125 mg/2 mL injection 125 mg (125 mg Intravenous Given 05/30/21 1342)  hydrALAZINE (APRESOLINE) tablet 50 mg (50 mg Oral Given 05/30/21 1316)  amLODipine (NORVASC) tablet 10 mg (10 mg Oral Given 05/30/21 1316)  iohexol (OMNIPAQUE) 350 MG/ML injection 75 mL (75 mLs Intravenous Contrast Given 05/30/21 1418)    ED Course/ Medical Decision Making/ A&P                           Medical Decision Making  46 y.o. female presents to the ED with complaints of chest pain and shortness of breath, this involves an extensive number of treatment options, and is a complaint that carries with it a high risk of complications and morbidity.  The differential diagnosis includes ACS, PE, hypertensive emergency, CHF, COPD exacerbation, costochondritis, COVID, pneumonia, gastritis, pneumothorax, aortic dissection  On arrival pt is chronically ill-appearing, on arrival patient is mildly tachycardic and noted to be significantly hypertensive with systolic in the 161W-960A, does not take her blood pressure medication because she reports difficulty affording this. Exam significant for some faint wheezing and decreased air movement on exam, chest wall is tender to palpation, frequent  cough  Additional history obtained from friend at bedside. Previous records obtained and reviewed via EMR  I ordered patient's home hydralazine, lisinopril and amlodipine for blood pressure control, as well as albuterol and Solu-Medrol for shortness of breath.  Morphine given for pain management.  Lab Tests:  I Ordered, reviewed, and interpreted labs, which included:  CBC: No leukocytosis, stable hemoglobin CMP: Mild hyponatremia of 131, no other significant electrolyte derangements, normal renal and liver function. Lipase: WNL Troponin: Initial troponin is reassuring at 10, delta troponin pending  EKG: Sinus rhythm with LVH, no concerning changes when compared to prior.  Imaging Studies ordered:  I ordered imaging studies which included chest x-ray and CT PE study, I independently visualized and interpreted imaging which showed no active cardiopulmonary disease on chest x-ray.  CT without evidence of PE, and no other acute findings.  ED Course:   Pain has improved and patient's blood pressure is improving as well.  Work-up thus far has been reassuring.  No signs of ACS, no signs of endorgan damage from hypertension and PE study is negative.  Chest pain is reproducible on palpation and may be related to costochondritis especially in the setting of recent viral infection.  Patient is unable to tolerate NSAIDs.  Pain improved with treatment here in the ED.    I consulted social work regarding issues affording medications, because patient has Medicaid she is not eligible for match letter or further medication assistance.  I have sent patient's blood pressure medications into the [facility] at [facility] per the patient's request.  Discussed that she should be able to get these medications for $2-$3.  At shift change delta troponin is pending, care signed out to [facility], as long as delta troponin without any significant increase patient can be discharged home with outpatient  follow-up.  Portions of this note were generated with [employer]. Dictation errors may occur despite best attempts at proofreading.  Final Clinical Impression(s) / ED Diagnoses Final diagnoses:  SOB (shortness of breath)  Pleuritic chest pain    Rx / DC Orders ED Discharge Orders     None         Janet Berlin 05/30/21 1517    Margette Fast, MD 05/31/21 [phone]

## 2021-05-30 NOTE — ED Notes (Signed)
Pt in CT at this time.

## 2021-05-30 NOTE — ED Notes (Addendum)
This nurse exited ED exam room 19 and pt's exam room, ED Bed 20, was empty with blood on the sheet. Pt appears to have eloped the department and IV may or may not still be in pt's left AC. Charge Nurse Lonna Duval, RN., notified and aware. PA-C notified and aware.

## 2021-05-30 NOTE — Care Management (Signed)
Patient is not eligible for MATCH medication assistance because  they have insurance. Please place a TOC consult for any other needs.

## 2021-06-01 LAB — CULTURE, BLOOD (ROUTINE X 2): Culture: NO GROWTH

## 2021-06-23 ENCOUNTER — Emergency Department (HOSPITAL_COMMUNITY): Payer: Medicaid Other

## 2021-06-23 ENCOUNTER — Other Ambulatory Visit: Payer: Self-pay

## 2021-06-23 ENCOUNTER — Emergency Department (HOSPITAL_COMMUNITY)
Admission: EM | Admit: 2021-06-23 | Discharge: 2021-06-23 | Discharge status: Against Medical Advice | Payer: Medicaid Other | Attending: Emergency Medicine | Admitting: Emergency Medicine

## 2021-06-23 ENCOUNTER — Encounter (HOSPITAL_COMMUNITY): Payer: Self-pay | Admitting: *Deleted

## 2021-06-23 DIAGNOSIS — R197 Diarrhea, unspecified: Secondary | ICD-10-CM | POA: Insufficient documentation

## 2021-06-23 DIAGNOSIS — I10 Essential (primary) hypertension: Secondary | ICD-10-CM | POA: Diagnosis not present

## 2021-06-23 DIAGNOSIS — R000 Tachycardia, unspecified: Secondary | ICD-10-CM | POA: Insufficient documentation

## 2021-06-23 DIAGNOSIS — R1032 Left lower quadrant pain: Secondary | ICD-10-CM | POA: Insufficient documentation

## 2021-06-23 DIAGNOSIS — Z79899 Other long term (current) drug therapy: Secondary | ICD-10-CM | POA: Diagnosis not present

## 2021-06-23 DIAGNOSIS — R200 Anesthesia of skin: Secondary | ICD-10-CM | POA: Diagnosis not present

## 2021-06-23 LAB — COMPREHENSIVE METABOLIC PANEL
ALT: 12 U/L (ref 0–44)
AST: 16 U/L (ref 15–41)
Albumin: 3.6 g/dL (ref 3.5–5.0)
Alkaline Phosphatase: 91 U/L (ref 38–126)
Anion gap: 8 (ref 5–15)
BUN: 18 mg/dL (ref 6–20)
CO2: 27 mmol/L (ref 22–32)
Calcium: 9 mg/dL (ref 8.9–10.3)
Chloride: 100 mmol/L (ref 98–111)
Creatinine, Ser: 1 mg/dL (ref 0.44–1.00)
GFR, Estimated: >60 mL/min (ref >60)
Glucose, Bld: 106 mg/dL — ABNORMAL HIGH (ref 70–99)
Potassium: 2.8 mmol/L — ABNORMAL LOW (ref 3.5–5.1)
Sodium: 135 mmol/L (ref 135–145)
Total Bilirubin: 0.8 mg/dL (ref 0.3–1.2)
Total Protein: 6.9 g/dL (ref 6.5–8.1)

## 2021-06-23 LAB — CBC
HCT: 34.5 % — ABNORMAL LOW (ref 36.0–46.0)
Hemoglobin: 11.6 g/dL — ABNORMAL LOW (ref 12.0–15.0)
MCH: 23.8 pg — ABNORMAL LOW (ref 26.0–34.0)
MCHC: 33.6 g/dL (ref 30.0–36.0)
MCV: 70.8 fL — ABNORMAL LOW (ref 80.0–100.0)
Platelets: 342 10*3/uL (ref 150–400)
RBC: 4.87 MIL/uL (ref 3.87–5.11)
RDW: 20.5 % — ABNORMAL HIGH (ref 11.5–15.5)
WBC: 11.6 10*3/uL — ABNORMAL HIGH (ref 4.0–10.5)
nRBC: 0 % (ref 0.0–0.2)

## 2021-06-23 LAB — MAGNESIUM: Magnesium: 2.4 mg/dL (ref 1.7–2.4)

## 2021-06-23 LAB — LIPASE, BLOOD: Lipase: 24 U/L (ref 11–51)

## 2021-06-23 MED ORDER — MORPHINE SULFATE (PF) 4 MG/ML IV SOLN
6.0000 mg | Freq: Once | INTRAVENOUS | Status: AC
Start: 2021-06-23 — End: 2021-06-23
  Administered 2021-06-23: 6 mg via INTRAVENOUS
  Filled 2021-06-23: qty 2

## 2021-06-23 MED ORDER — SODIUM CHLORIDE (PF) 0.9 % IJ SOLN
INTRAMUSCULAR | Status: AC
  Administered 2021-06-23: 10 mL
  Filled 2021-06-23: qty 50

## 2021-06-23 MED ORDER — POTASSIUM CHLORIDE CRYS ER 20 MEQ PO TBCR
40.0000 meq | EXTENDED_RELEASE_TABLET | Freq: Once | ORAL | Status: AC
  Administered 2021-06-23: 40 meq via ORAL
  Filled 2021-06-23: qty 2

## 2021-06-23 MED ORDER — IOHEXOL 350 MG/ML SOLN
80.0000 mL | Freq: Once | INTRAVENOUS | Status: AC | PRN
  Administered 2021-06-23: 80 mL via INTRAVENOUS

## 2021-06-23 NOTE — ED Notes (Signed)
Hypertensive in triage, says she cannot afford any of her medications

## 2021-06-23 NOTE — ED Triage Notes (Signed)
Pt states cold symptoms (cough, runny nose, congestion) and onset of abdominal pain and diarrhea tonight. Unsure of fevers.

## 2021-06-23 NOTE — ED Provider Notes (Signed)
Terri [name]   CSN: [id number] Arrival date & time: 06/23/21  2951     History  Chief Complaint  Patient presents with   Diarrhea    Terri [name] is a 46 y.o. female.  HPI    46 year old female comes in with chief complaint of diarrhea.   Patient has history of hypertension, DVT, cocaine use disorder, neuropathy.   Patient indicates that she started having diarrhea about 2 days ago.  She has had some URI-like symptoms about the same time.  She was diagnosed with COVID-19 last month.  She also indicates that that the diarrhea are loose, nonbloody.  She also thinks that she might of lost control of her sphincter and defecated on herself.  She denies any urinary incontinence, urinary retention, lower extremity numbness, weakness.  Patient also noted to have left lower extremity numbness.  She indicates that she has had several spine surgery and has had some numbness in her leg from it.  She however did not know that the numbness was this extreme.  She is unsure how long the profound numbness has been present.  Pt has no associated weakness, urinary incontinence, urinary retention, pins and needle sensation in the perineal area.    Home Medications Prior to Admission medications   Medication Sig Start Date End Date Taking? Authorizing Provider  acetaminophen (TYLENOL) 500 MG tablet Take 2 tablets (1,000 mg total) by mouth every 8 (eight) hours as needed for mild pain. 05/29/21   Mariel Aloe, MD  amLODipine (NORVASC) 10 MG tablet Take 1 tablet (10 mg total) by mouth daily. 05/30/21 08/28/21  Jacqlyn Larsen, PA-C  Ferrous Fumarate (IRON) 18 MG TBCR Take 1 tablet (18 mg total) by mouth 2 (two) times daily after a meal. Patient not taking: Reported on 04/18/2021 04/06/21   Domenic Polite, MD  hydrALAZINE (APRESOLINE) 50 MG tablet Take 1 tablet (50 mg total) by mouth 2 (two) times daily. 05/30/21 08/28/21  Jacqlyn Larsen, PA-C  hydrOXYzine  (ATARAX/VISTARIL) 25 MG tablet Take 1 tablet (25 mg total) by mouth every 8 (eight) hours as needed for anxiety. Patient not taking: Reported on 04/18/2021 04/06/21   Domenic Polite, MD  lisinopril (ZESTRIL) 10 MG tablet Take 1 tablet (10 mg total) by mouth daily. 05/30/21 08/28/21  Jacqlyn Larsen, PA-C  methocarbamol (ROBAXIN) 750 MG tablet Take 1 tablet (750 mg total) by mouth every 8 (eight) hours as needed for muscle spasms. Patient not taking: Reported on 04/18/2021 04/06/21   Domenic Polite, MD  nicotine (NICODERM CQ - DOSED IN MG/24 HOURS) 21 mg/24hr patch Place 1 patch (21 mg total) onto the skin daily. Patient not taking: Reported on 04/18/2021 04/07/21   Domenic Polite, MD  ondansetron (ZOFRAN-ODT) 4 MG disintegrating tablet Take 1 tablet (4 mg total) by mouth every 8 (eight) hours as needed for nausea or vomiting. Patient not taking: Reported on 05/23/2021 04/21/21   Tedd Sias, PA  oxyCODONE (OXY IR/ROXICODONE) 5 MG immediate release tablet Take 1 tablet (5 mg total) by mouth every 6 (six) hours as needed for severe pain. 05/29/21   Mariel Aloe, MD  pantoprazole (PROTONIX) 40 MG tablet Take 1 tablet (40 mg total) by mouth daily. Patient not taking: Reported on 05/23/2021 03/18/21   Edwin Dada, MD  vitamin B-12 (CYANOCOBALAMIN) 1000 MCG tablet Take 1 tablet (1,000 mcg total) by mouth daily. Patient not taking: Reported on 04/18/2021 04/06/21   Domenic Polite, MD  Allergies    Nsaids and Ketorolac tromethamine    Review of Systems   Review of Systems  Constitutional:  Positive for activity change.  Gastrointestinal:  Positive for diarrhea.   Physical Exam Updated Vital Signs BP (!) 201/132    Pulse 70    Temp 97.6 F (36.4 C) (Oral)    Resp 16    Ht 5\' 9"  (1.753 m)    Wt 54.4 kg    SpO2 97%    BMI 17.72 kg/m  Physical Exam Vitals and nursing [name] reviewed.  Constitutional:      Appearance: She is well-developed.  HENT:     Head: Atraumatic.   Cardiovascular:     Rate and Rhythm: Normal rate.     Comments: 2+ and equal bilateral radial and dorsalis pedis pulse Pulmonary:     Effort: Pulmonary effort is normal.  Abdominal:     Palpations: There is no mass.     Tenderness: There is abdominal tenderness.  Musculoskeletal:     Cervical back: Normal range of motion and neck supple.  Skin:    General: Skin is warm and dry.  Neurological:     Mental Status: She is alert and oriented to person, place, and time.    ED Results / Procedures / Treatments   Labs (all labs ordered are listed, but only abnormal results are displayed) Labs Reviewed  COMPREHENSIVE METABOLIC PANEL - Abnormal; Notable for the following components:      Result Value   Potassium 2.8 (*)    Glucose, Bld 106 (*)    All other components within normal limits  CBC - Abnormal; Notable for the following components:   WBC 11.6 (*)    Hemoglobin 11.6 (*)    HCT 34.5 (*)    MCV 70.8 (*)    MCH 23.8 (*)    RDW 20.5 (*)    All other components within normal limits  LIPASE, BLOOD  MAGNESIUM  URINALYSIS, ROUTINE W REFLEX MICROSCOPIC    EKG EKG Interpretation  Date/Time:  Tuesday June 23 2021 01:16:32 EST Ventricular Rate:  77 PR Interval:  134 QRS Duration: 86 QT Interval:  391 QTC Calculation: 443 R Axis:   81 Text Interpretation: Sinus rhythm RAE, consider biatrial enlargement Probable LVH with secondary repol abnrm No significant change since last tracing Confirmed by Ripley Fraise [phone]) on 06/23/2021 1:44:50 AM  Radiology CT T-SPINE NO CHARGE  Result Date: 06/23/2021 CLINICAL DATA:  Cold symptoms. Back pain and left lower quadrant pain EXAM: CT THORACIC AND LUMBAR SPINE WITHOUT CONTRAST TECHNIQUE: Multidetector CT imaging of the thoracic and lumbar spine was performed without contrast. Multiplanar CT image reconstructions were also generated. RADIATION DOSE REDUCTION: This exam was performed according to the departmental dose-optimization  program which includes automated exposure control, adjustment of the mA and/or kV according to patient size and/or use of iterative reconstruction technique. COMPARISON:  CT angio chest 05/28/2021 FINDINGS: CT THORACIC SPINE FINDINGS Alignment: Normal Vertebrae: Negative for fracture or mass Paraspinal and other soft tissues: Negative for paravertebral mass. CT chest findings reported separately. Disc levels: T1-2: Chronic calcified disc protrusion on the right. No significant stenosis T2-3: Small chronic calcific disc protrusion on the right. No significant stenosis. T3-4: Mild spurring on the right.  No significant stenosis T4-5: Chronic calcified disc protrusion on the right with mild mass-effect on the lateral thecal sac. No significant spinal or foraminal stenosis T5-6: Chronic calcified disc protrusion on the right without significant stenosis. T6-7: Chronic calcified disc protrusion  on the right. No significant stenosis T7-8: Chronic calcified disc protrusion on the left. No significant stenosis. T8-9: Negative T9-10: Chronic calcified disc protrusion on the left. No significant spinal or foraminal stenosis. T10-11: Negative T11-12: Mild disc degeneration.  Negative for stenosis. T12-L1: Negative CT LUMBAR SPINE FINDINGS Segmentation: 5 lumbar vertebra Alignment: Normal Vertebrae: Negative for fracture or mass. Extensive sclerotic changes at L3-4 related to disc degeneration. Paraspinal and other soft tissues: Negative for paraspinous mass. CT abdomen pelvis reported separately. Disc levels: L1-2: Negative L2-3: Mild disc bulging and bilateral facet degeneration. Mild spinal stenosis. L3-4: Advanced disc degeneration with disc space narrowing and extensive discogenic sclerosis. Diffuse endplate spurring and bilateral facet degeneration. Laminotomy. Mild spinal stenosis. Extruded disc fragment extending cranially just to the right of midline is partially calcified and unchanged from MRI 2017. Mild subarticular  stenosis bilaterally L4-5: Moderate disc degeneration with disc space narrowing, disc bulging and diffuse endplate spurring. Gas in the disc space and in the posterior annular region. Bilateral laminectomy. Spinal canal decompressed. Mild subarticular stenosis bilaterally. No change from the prior study. L5-S1: Laminotomy with good decompression of thecal sac. Disc degeneration with diffuse endplate spurring. Mild subarticular stenosis bilaterally due to spurring. IMPRESSION: CT THORACIC SPINE IMPRESSION 1. No acute abnormality 2. Multilevel disc degeneration throughout the thoracic spine without significant spinal stenosis. Multiple small calcified disc protrusions are similar to recent CT. CT LUMBAR SPINE IMPRESSION 1. No acute abnormality lumbar spine 2. Multilevel lumbar degenerative change. Laminectomy L2-3 and L3-4. Mild spinal stenosis L3-4. Mild subarticular stenosis bilaterally L3-4, L4-5, and L5-S1. Electronically Signed   By: Franchot Gallo M.D.   On: 06/23/2021 11:05   CT L-SPINE NO CHARGE  Result Date: 06/23/2021 CLINICAL DATA:  Cold symptoms. Back pain and left lower quadrant pain EXAM: CT THORACIC AND LUMBAR SPINE WITHOUT CONTRAST TECHNIQUE: Multidetector CT imaging of the thoracic and lumbar spine was performed without contrast. Multiplanar CT image reconstructions were also generated. RADIATION DOSE REDUCTION: This exam was performed according to the departmental dose-optimization program which includes automated exposure control, adjustment of the mA and/or kV according to patient size and/or use of iterative reconstruction technique. COMPARISON:  CT angio chest 05/28/2021 FINDINGS: CT THORACIC SPINE FINDINGS Alignment: Normal Vertebrae: Negative for fracture or mass Paraspinal and other soft tissues: Negative for paravertebral mass. CT chest findings reported separately. Disc levels: T1-2: Chronic calcified disc protrusion on the right. No significant stenosis T2-3: Small chronic calcific disc  protrusion on the right. No significant stenosis. T3-4: Mild spurring on the right.  No significant stenosis T4-5: Chronic calcified disc protrusion on the right with mild mass-effect on the lateral thecal sac. No significant spinal or foraminal stenosis T5-6: Chronic calcified disc protrusion on the right without significant stenosis. T6-7: Chronic calcified disc protrusion on the right. No significant stenosis T7-8: Chronic calcified disc protrusion on the left. No significant stenosis. T8-9: Negative T9-10: Chronic calcified disc protrusion on the left. No significant spinal or foraminal stenosis. T10-11: Negative T11-12: Mild disc degeneration.  Negative for stenosis. T12-L1: Negative CT LUMBAR SPINE FINDINGS Segmentation: 5 lumbar vertebra Alignment: Normal Vertebrae: Negative for fracture or mass. Extensive sclerotic changes at L3-4 related to disc degeneration. Paraspinal and other soft tissues: Negative for paraspinous mass. CT abdomen pelvis reported separately. Disc levels: L1-2: Negative L2-3: Mild disc bulging and bilateral facet degeneration. Mild spinal stenosis. L3-4: Advanced disc degeneration with disc space narrowing and extensive discogenic sclerosis. Diffuse endplate spurring and bilateral facet degeneration. Laminotomy. Mild spinal stenosis. Extruded disc fragment  extending cranially just to the right of midline is partially calcified and unchanged from MRI 2017. Mild subarticular stenosis bilaterally L4-5: Moderate disc degeneration with disc space narrowing, disc bulging and diffuse endplate spurring. Gas in the disc space and in the posterior annular region. Bilateral laminectomy. Spinal canal decompressed. Mild subarticular stenosis bilaterally. No change from the prior study. L5-S1: Laminotomy with good decompression of thecal sac. Disc degeneration with diffuse endplate spurring. Mild subarticular stenosis bilaterally due to spurring. IMPRESSION: CT THORACIC SPINE IMPRESSION 1. No acute  abnormality 2. Multilevel disc degeneration throughout the thoracic spine without significant spinal stenosis. Multiple small calcified disc protrusions are similar to recent CT. CT LUMBAR SPINE IMPRESSION 1. No acute abnormality lumbar spine 2. Multilevel lumbar degenerative change. Laminectomy L2-3 and L3-4. Mild spinal stenosis L3-4. Mild subarticular stenosis bilaterally L3-4, L4-5, and L5-S1. Electronically Signed   By: Franchot Gallo M.D.   On: 06/23/2021 11:05   CT Angio Chest/Abd/Pel for Dissection W and/or W/WO  Result Date: 06/23/2021 CLINICAL DATA:  Cold symptoms with onset of abdominal pain and diarrhea. Left lower quadrant pain and back pain. Acute aortic syndrome suspected. EXAM: CT ANGIOGRAPHY CHEST, ABDOMEN AND PELVIS TECHNIQUE: Non-contrast CT of the chest was initially obtained. Multidetector CT imaging through the chest, abdomen and pelvis was performed using the standard protocol during bolus administration of intravenous contrast. Multiplanar reconstructed images and MIPs were obtained and reviewed to evaluate the vascular anatomy. RADIATION DOSE REDUCTION: This exam was performed according to the departmental dose-optimization program which includes automated exposure control, adjustment of the mA and/or kV according to patient size and/or use of iterative reconstruction technique. CONTRAST:  34mL OMNIPAQUE IOHEXOL 350 MG/ML SOLN COMPARISON:  Chest CTA 05/30/2021 and 05/28/2021. Abdominopelvic CT 04/18/2021. FINDINGS: CTA CHEST FINDINGS Cardiovascular: Contrast bolus timing was targeted to aortic assessment. There is suboptimal opacification of the pulmonary arteries. No evidence of acute pulmonary embolism. There is atherosclerosis of the aorta, great vessels and coronary arteries. No evidence of aortic dissection or aneurysm. The heart size is normal. There is no pericardial effusion. Mediastinum/Nodes: There are no enlarged mediastinal, hilar or axillary lymph nodes. The thyroid gland,  trachea and esophagus demonstrate no significant findings. Lungs/Pleura: No pleural effusion or pneumothorax. Centrilobular emphysema with mild central airway thickening and scattered linear scarring at both lung bases. No suspicious pulmonary nodules. Musculoskeletal/Chest wall: There is no chest wall mass or suspicious osseous finding. Healed fracture of the left 9th rib laterally. Mild thoracic spondylosis. Review of the MIP images confirms the above findings. CTA ABDOMEN AND PELVIS FINDINGS VASCULAR Aorta: Normal caliber aorta without aneurysm, dissection, vasculitis or significant stenosis. Age advanced atherosclerosis. Celiac: Patent without evidence of aneurysm, dissection, vasculitis or significant stenosis. SMA: Patent without evidence of aneurysm, dissection, vasculitis or significant stenosis. Renals: Both renal arteries are patent without evidence of aneurysm, dissection, vasculitis, fibromuscular dysplasia or significant stenosis. IMA: Patent without evidence of aneurysm, dissection, vasculitis or significant stenosis. Inflow: Patent without evidence of aneurysm, dissection, vasculitis or significant stenosis. Age advanced atherosclerosis. Veins: Limited venous opacification.  No apparent abnormality. Review of the MIP images confirms the above findings. NON-VASCULAR Hepatobiliary: No suspicious findings as imaged in the arterial phase. Subcapsular cyst peripherally in the right hepatic lobe is unchanged. Stable mild biliary dilatation status post cholecystectomy. Pancreas: Unremarkable. No pancreatic ductal dilatation or surrounding inflammatory changes. Spleen: Normal in size without focal abnormality. Adrenals/Urinary Tract: Both adrenal glands appear normal. There is cortical scarring of both kidneys. No evidence of renal or ureteral calculus. There  is no hydronephrosis or perinephric soft tissue stranding. 8 mm calcified lesion along the posterior wall of the bladder has mildly enlarged compared  with the previous abdominopelvic CT, probably a bladder calculus, although potentially a calcified lesion in the posterior bladder wall. No other bladder abnormality identified. Stomach/Bowel: No enteric contrast administered. 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 No bowel wall thickening or significant distention identified. Lymphatic: Nodal assessment limited by paucity of intra-abdominal fat. No enlarged abdominopelvic lymph nodes identified. Reproductive: The uterus and ovaries appear unremarkable. No adnexal mass. Other: Paucity of intra-abdominal fat with generalized edema throughout the subcutaneous and intra-abdominal fat. There is a small amount of ascites. No focal fluid collection or free air identified. Musculoskeletal: No acute osseous findings. Multilevel spondylosis status post multilevel posterior decompression. Review of the MIP images confirms the above findings. IMPRESSION: 1. No acute vascular findings identified within the chest, abdomen or pelvis. 2. Age advanced coronary and Aortic Atherosclerosis (ICD10-I70.0). No evidence of large vessel occlusion or significant vascular stenosis. 3.  Emphysema (ICD10-J43.9). 4. Bilateral renal cortical scarring. Suspected enlarging calculus dependently in the bladder versus partially calcified lesion in the posterior wall of the bladder. 5. Generalized soft tissue edema and ascites suspicious for anasarca. Electronically Signed   By: Richardean Sale M.D.   On: 06/23/2021 11:15    Procedures Procedures    Medications Ordered in ED Medications  potassium chloride SA (KLOR-CON M) CR tablet 40 mEq (40 mEq Oral Given 06/23/21 0845)  morphine 4 MG/ML injection 6 mg (6 mg Intravenous Given 06/23/21 0950)  iohexol (OMNIPAQUE) 350 MG/ML injection 80 mL (80 mLs Intravenous Contrast Given 06/23/21 1011)  sodium chloride (PF) 0.9 % injection (10 mLs  Given 06/23/21 0951)    ED Course/ Medical Decision Making/  A&P Clinical Course as of 06/23/21 1253  Tue Jun 23, 2021  1252 CBC(!) Labs are overall reassuring.  CBC only slightly elevated. [AN]  1252 Comprehensive metabolic panel(!) Kidney function is normal. Magnesium is normal [AN]  1252 CT Angio Chest/Abd/Pel for Dissection W and/or W/WO CT scan is negative for any acute findings. Nursing staff informing that patient just left after her morphine because she had an 11 AM appointment.  I did not have Winfred conversation with her. [AN]    Clinical Course User Index [AN] Varney Biles, MD                           Medical Decision Making 46 year old female comes in with chief complaint of diarrhea and URI-like symptoms for the last 2 or 3 days. She had COVID-19 earlier in the year.  She also admits to cocaine use disorder and indicates several lower spine surgery history.  Patient's diarrhea started yesterday.  She indicates that she has had few episodes where she did not even know she had to defecate, and soil herself.  She has no urinary incontinence or retention.  She is denying any new numbness or weakness over her upper or lower extremities, but found to have left lower extremity numbness on her exam.  She states that she always has some numbness after her surgery, but it appears to be worse than usual.  Cardiovascular exam is overall reassuring.  She is tachycardic and hypertensive at arrival.  Plan is to get CT dissection given that she is having abdominal pain, back pain, lower extremity numbness with unknown duration, bowel incontinence.  Basic labs also ordered along with it.  Reassessment to  be completed post exam to see if patient needs further imaging, specifically looking at her spine.  Patient made aware of the plan.  Pain medication ordered.   Problems Addressed: Diarrhea, unspecified type: undiagnosed new problem with uncertain prognosis  Amount and/or Complexity of Data Reviewed External Data Reviewed: notes. Labs: ordered.  Decision-making details documented in ED Course. Radiology: ordered.  Risk Prescription drug management.           Final Clinical Impression(s) / ED Diagnoses Final diagnoses:  Diarrhea, unspecified type    Rx / DC Orders ED Discharge Orders     None         Varney Biles, MD 06/23/21 1253

## 2021-06-23 NOTE — ED Notes (Signed)
Upon entering pt room, pt was standing, dressed, and had removed all monitoring devices. On the stretcher were tape and gauze the pt had removed from the cabinets. I asked pt if she was planning to remove her IV. Pt said, "Take this IV out. I have to leave. I'm already going to miss the bus. I can't miss this appointment." I encouraged pt to stay and she said, "Tell them I'll come back tonight." Again, I encouraged pt to stay and pt declined. I removed pt IV per her request and discharged her AMA.

## 2021-06-24 ENCOUNTER — Emergency Department (HOSPITAL_COMMUNITY)
Admission: EM | Admit: 2021-06-24 | Discharge: 2021-06-25 | Disposition: A | Discharge status: Home / Self Care | Payer: Medicaid Other | Attending: Emergency Medicine | Admitting: Emergency Medicine

## 2021-06-24 ENCOUNTER — Emergency Department (HOSPITAL_COMMUNITY): Payer: Medicaid Other

## 2021-06-24 DIAGNOSIS — R1084 Generalized abdominal pain: Secondary | ICD-10-CM | POA: Insufficient documentation

## 2021-06-24 DIAGNOSIS — J45909 Unspecified asthma, uncomplicated: Secondary | ICD-10-CM | POA: Diagnosis not present

## 2021-06-24 DIAGNOSIS — I10 Essential (primary) hypertension: Secondary | ICD-10-CM | POA: Insufficient documentation

## 2021-06-24 DIAGNOSIS — Z20822 Contact with and (suspected) exposure to covid-19: Secondary | ICD-10-CM | POA: Insufficient documentation

## 2021-06-24 DIAGNOSIS — R059 Cough, unspecified: Secondary | ICD-10-CM | POA: Diagnosis not present

## 2021-06-24 DIAGNOSIS — R6883 Chills (without fever): Secondary | ICD-10-CM | POA: Insufficient documentation

## 2021-06-24 DIAGNOSIS — D509 Iron deficiency anemia, unspecified: Secondary | ICD-10-CM | POA: Insufficient documentation

## 2021-06-24 DIAGNOSIS — R112 Nausea with vomiting, unspecified: Secondary | ICD-10-CM

## 2021-06-24 DIAGNOSIS — E871 Hypo-osmolality and hyponatremia: Secondary | ICD-10-CM | POA: Insufficient documentation

## 2021-06-24 DIAGNOSIS — R61 Generalized hyperhidrosis: Secondary | ICD-10-CM | POA: Diagnosis not present

## 2021-06-24 DIAGNOSIS — E876 Hypokalemia: Secondary | ICD-10-CM | POA: Insufficient documentation

## 2021-06-24 DIAGNOSIS — Z79899 Other long term (current) drug therapy: Secondary | ICD-10-CM | POA: Insufficient documentation

## 2021-06-24 DIAGNOSIS — R197 Diarrhea, unspecified: Secondary | ICD-10-CM | POA: Diagnosis not present

## 2021-06-24 DIAGNOSIS — M549 Dorsalgia, unspecified: Secondary | ICD-10-CM | POA: Diagnosis not present

## 2021-06-24 LAB — CBC WITH DIFFERENTIAL/PLATELET
Abs Immature Granulocytes: 0.02 10*3/uL (ref 0.00–0.07)
Basophils Absolute: 0 10*3/uL (ref 0.0–0.1)
Basophils Relative: 0 %
Eosinophils Absolute: 0 10*3/uL (ref 0.0–0.5)
Eosinophils Relative: 0 %
HCT: 29.3 % — ABNORMAL LOW (ref 36.0–46.0)
Hemoglobin: 10.1 g/dL — ABNORMAL LOW (ref 12.0–15.0)
Immature Granulocytes: 0 %
Lymphocytes Relative: 20 %
Lymphs Abs: 1.8 10*3/uL (ref 0.7–4.0)
MCH: 24.8 pg — ABNORMAL LOW (ref 26.0–34.0)
MCHC: 34.5 g/dL (ref 30.0–36.0)
MCV: 71.8 fL — ABNORMAL LOW (ref 80.0–100.0)
Monocytes Absolute: 0.4 10*3/uL (ref 0.1–1.0)
Monocytes Relative: 5 %
Neutro Abs: 6.7 10*3/uL (ref 1.7–7.7)
Neutrophils Relative %: 75 %
Platelets: 269 10*3/uL (ref 150–400)
RBC: 4.08 MIL/uL (ref 3.87–5.11)
RDW: 20 % — ABNORMAL HIGH (ref 11.5–15.5)
WBC: 9 10*3/uL (ref 4.0–10.5)
nRBC: 0 % (ref 0.0–0.2)

## 2021-06-24 LAB — COMPREHENSIVE METABOLIC PANEL
ALT: 11 U/L (ref 0–44)
AST: 16 U/L (ref 15–41)
Albumin: 3.5 g/dL (ref 3.5–5.0)
Alkaline Phosphatase: 85 U/L (ref 38–126)
Anion gap: 9 (ref 5–15)
BUN: 22 mg/dL — ABNORMAL HIGH (ref 6–20)
CO2: 24 mmol/L (ref 22–32)
Calcium: 8.8 mg/dL — ABNORMAL LOW (ref 8.9–10.3)
Chloride: 98 mmol/L (ref 98–111)
Creatinine, Ser: 0.81 mg/dL (ref 0.44–1.00)
GFR, Estimated: >60 mL/min (ref >60)
Glucose, Bld: 85 mg/dL (ref 70–99)
Potassium: 3.3 mmol/L — ABNORMAL LOW (ref 3.5–5.1)
Sodium: 131 mmol/L — ABNORMAL LOW (ref 135–145)
Total Bilirubin: 0.8 mg/dL (ref 0.3–1.2)
Total Protein: 6.8 g/dL (ref 6.5–8.1)

## 2021-06-24 LAB — I-STAT BETA HCG BLOOD, ED (MC, WL, AP ONLY): I-stat hCG, quantitative: <5 m[IU]/mL (ref <5)

## 2021-06-24 LAB — TROPONIN I (HIGH SENSITIVITY): Troponin I (High Sensitivity): 10 ng/L (ref <18)

## 2021-06-24 LAB — LIPASE, BLOOD: Lipase: 26 U/L (ref 11–51)

## 2021-06-24 NOTE — ED Provider Triage Note (Signed)
Emergency Medicine Provider Triage Evaluation Note  Terri [name] , a 46 y.o. female  was evaluated in triage.  Pt complains of feeling unwell.  She states she has had diarrhea over the last 3 days.  Now having generalized abdominal pain.  Also noted some cough, congestion, rhinorrhea as well as some chest pain earlier today.  Says her diarrhea is so bad she is defecating on herself.  States she has chronic back pain.  She denies any urinary incontinence, numbness.  She feels generally weak.  States she was seen yesterday for similar complaints.  No recent antibiotics or travel.  Review of Systems  Positive: Congestion, rhinorrhea, diarrhea, chest pain, abdominal pain Negative:   Physical Exam  There were no vitals taken for this visit. Gen:   Awake, no distress   Resp:  Normal effort  MSK:   Moves extremities without difficulty  Other:    Medical Decision Making  Medically screening exam initiated at 9:07 PM.  Appropriate orders placed.  Terri [name] was informed that the remainder of the evaluation will be completed by another provider, this initial triage assessment does not replace that evaluation, and the importance of remaining in the ED until their evaluation is complete.  Multiple complaints, hypertensive, will need room in back   Jaquavius Hudler A, PA-C 06/24/21 2109

## 2021-06-25 ENCOUNTER — Ambulatory Visit (HOSPITAL_COMMUNITY): Payer: Medicaid Other | Admitting: Student in an Organized Health Care Education/Training Program

## 2021-06-25 ENCOUNTER — Other Ambulatory Visit: Payer: Self-pay

## 2021-06-25 ENCOUNTER — Encounter (HOSPITAL_COMMUNITY): Payer: Self-pay | Admitting: Emergency Medicine

## 2021-06-25 LAB — RESP PANEL BY RT-PCR (FLU A&B, COVID) ARPGX2
Influenza A by PCR: NEGATIVE
Influenza B by PCR: NEGATIVE
SARS Coronavirus 2 by RT PCR: NEGATIVE

## 2021-06-25 MED ORDER — LORAZEPAM 2 MG/ML IJ SOLN: 0.0000 mg | Freq: Two times a day (BID) | INTRAMUSCULAR | Status: DC

## 2021-06-25 MED ORDER — LISINOPRIL 10 MG PO TABS
10.0000 mg | ORAL_TABLET | Freq: Every day | ORAL | Status: DC
  Administered 2021-06-25: 10 mg via ORAL
  Filled 2021-06-25: qty 1

## 2021-06-25 MED ORDER — ONDANSETRON HCL 4 MG PO TABS: 4.0000 mg | ORAL_TABLET | Freq: Three times a day (TID) | ORAL | 0 refills | Status: DC | PRN

## 2021-06-25 MED ORDER — LORAZEPAM 2 MG/ML IJ SOLN: 0.0000 mg | Freq: Four times a day (QID) | INTRAMUSCULAR | Status: DC

## 2021-06-25 MED ORDER — ALUM & MAG HYDROXIDE-SIMETH 200-200-20 MG/5ML PO SUSP: 30.0000 mL | Freq: Four times a day (QID) | ORAL | Status: DC | PRN

## 2021-06-25 MED ORDER — POTASSIUM CHLORIDE CRYS ER 20 MEQ PO TBCR
40.0000 meq | EXTENDED_RELEASE_TABLET | Freq: Once | ORAL | Status: AC
  Administered 2021-06-25: 40 meq via ORAL
  Filled 2021-06-25: qty 2

## 2021-06-25 MED ORDER — LOPERAMIDE HCL 2 MG PO CAPS: 4.0000 mg | ORAL_CAPSULE | Freq: Four times a day (QID) | ORAL | Status: DC | PRN

## 2021-06-25 MED ORDER — LACTATED RINGERS IV BOLUS
1000.0000 mL | Freq: Once | INTRAVENOUS | Status: AC
Start: 2021-06-25 — End: 2021-06-25
  Administered 2021-06-25: 1000 mL via INTRAVENOUS

## 2021-06-25 MED ORDER — LORAZEPAM 1 MG PO TABS
0.0000 mg | ORAL_TABLET | Freq: Four times a day (QID) | ORAL | Status: DC
  Administered 2021-06-25: 2 mg via ORAL
  Filled 2021-06-25: qty 2

## 2021-06-25 MED ORDER — DICYCLOMINE HCL 10 MG PO CAPS: 10.0000 mg | ORAL_CAPSULE | Freq: Four times a day (QID) | ORAL | Status: DC | PRN

## 2021-06-25 MED ORDER — MORPHINE SULFATE (PF) 4 MG/ML IV SOLN
4.0000 mg | Freq: Once | INTRAVENOUS | Status: AC
  Administered 2021-06-25: 4 mg via INTRAVENOUS
  Filled 2021-06-25: qty 1

## 2021-06-25 MED ORDER — DICYCLOMINE HCL 10 MG PO CAPS: 10.0000 mg | ORAL_CAPSULE | Freq: Once | ORAL | Status: DC

## 2021-06-25 MED ORDER — ONDANSETRON 4 MG PO TBDP: 4.0000 mg | ORAL_TABLET | ORAL | Status: DC | PRN

## 2021-06-25 MED ORDER — ONDANSETRON HCL 4 MG/2ML IJ SOLN
4.0000 mg | Freq: Once | INTRAMUSCULAR | Status: AC
  Administered 2021-06-25: 4 mg via INTRAVENOUS
  Filled 2021-06-25: qty 2

## 2021-06-25 MED ORDER — LORAZEPAM 1 MG PO TABS: 0.0000 mg | ORAL_TABLET | Freq: Two times a day (BID) | ORAL | Status: DC

## 2021-06-25 MED ORDER — THIAMINE HCL 100 MG/ML IJ SOLN: 100.0000 mg | Freq: Every day | INTRAMUSCULAR | Status: DC

## 2021-06-25 MED ORDER — THIAMINE HCL 100 MG PO TABS
100.0000 mg | ORAL_TABLET | Freq: Every day | ORAL | Status: DC
  Administered 2021-06-25: 100 mg via ORAL
  Filled 2021-06-25: qty 1

## 2021-06-25 MED ORDER — LOPERAMIDE HCL 2 MG PO CAPS
4.0000 mg | ORAL_CAPSULE | Freq: Once | ORAL | Status: AC
Start: 2021-06-25 — End: 2021-06-25
  Administered 2021-06-25: 4 mg via ORAL
  Filled 2021-06-25: qty 2

## 2021-06-25 MED ORDER — ACETAMINOPHEN 500 MG PO TABS: 1000.0000 mg | ORAL_TABLET | Freq: Three times a day (TID) | ORAL | Status: DC | PRN

## 2021-06-25 MED ORDER — AMLODIPINE BESYLATE 5 MG PO TABS
10.0000 mg | ORAL_TABLET | Freq: Every day | ORAL | Status: DC
  Administered 2021-06-25: 10 mg via ORAL
  Filled 2021-06-25: qty 2

## 2021-06-25 NOTE — BH Assessment (Signed)
[facility] Assessment Progress Note   Per Sheran Fava, NP , this voluntary pt does not require psychiatric hospitalization at this time.  Pt is psychiatrically cleared.  Discharge instructions include referral information for [facility] and for several area substance use disorder treatment providers.  EDP Lennice Sites, DO and pt's nurse, Levada Dy, have been notified.  Jalene Mullet, [place] Triage Specialist [phone]

## 2021-06-25 NOTE — BH Assessment (Signed)
@  0830, requested patient's nurse Claiborne Billings, RN) to set up the TTS machine for patient to been seen.
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
## 2021-06-25 NOTE — TOC Initial Note (Signed)
Transition of Care [facility]) - Initial/Assessment Note    Patient Details  Name: Terri [name] MRN: [id number] Date of Birth: 08-Dec-1975  Transition of Care [facility]) CM/SW Contact:    Bethann Berkshire, [place] Phone Number: 06/25/2021, 9:11 AM  Clinical Narrative:                    CSW met with pt responding to substance use/homelessness consult. Pt initially indicates she has no needs. She states that she lives with a friend and is not currently working. She had SSDI income but disability was cut off in August 2022. She states she plans to reapply. She does not currently have a PCP. Pt is familiar with [facility] resources. She walks and uses the bus for transportation. Pt endorses cocaine and heroin use. She states she uses daily nasally and denies IV use; reports she does not have narcan. Pt expresses interest in residential substance use treatment. Psych will evaluate pt as well.   CSW provided pt with substance use resource list and explained available residential options who take medicaid that she can follow up with(also discussed some OP options). CSW also provided information about [facility] to help pt access narcan; they also sometimes help with linking people with treatment. Provided information for [facility] and [facility] for PCP as well.      Patient Goals and CMS Choice        Expected Discharge Plan and Services         Living arrangements for the past 2 months: Apartment                                      Prior Living Arrangements/Services Living arrangements for the past 2 months: Apartment Lives with:: Friends                   Activities of Daily Living      Permission Sought/Granted                  Emotional Assessment   Attitude/Demeanor/Rapport: Lethargic Affect (typically observed): Blunt Orientation: : Oriented to  Time, Oriented to Situation, Oriented to Place, Oriented to Self Alcohol / Substance Use: Illicit Drugs Psych  Involvement: Yes (comment)  Admission diagnosis:  chest pain,abd pain, back pain, vomiting, no controll of BM  Patient Active Problem List   Diagnosis Date Noted   Cellulitis of right lower extremity 05/28/2021   Vitamin B12 deficiency 05/24/2021   Cellulitis 05/22/2021   COVID-19 virus infection 05/22/2021   Overdose 04/13/2021   Hypoxemia 04/13/2021   Hypoglycemia 04/13/2021   Asthma exacerbation 04/01/2021   Anemia 04/01/2021   Iron deficiency anemia 03/18/2021   Hyponatremia 03/18/2021   Marginal ulcer 03/15/2021   Microcytic anemia 02/26/2021   Mild protein malnutrition ([place]) 02/26/2021   Polysubstance abuse ([place]) 01/16/2021   Abdominal pain 01/14/2021   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 coli) ([place]) 02/11/2019   AKI (acute kidney injury) ([place])    Peptic ulcer disease 02/09/2019   S/P gastric bypass 02/09/2019   Persistent fever 02/09/2019   COVID-19  virus not detected    Chest pain    Bacteremia due to Escherichia coli    Esophagitis    Hypokalemia    Hypomagnesemia    Sepsis ([place]) 02/02/2019   Leucocytosis 02/02/2019   Thrombocytosis 02/02/2019   UTI (urinary tract infection) 02/02/2019   Apnea 09/13/2017   Essential hypertension    Cellulitis and abscess of neck 08/12/2017   Cellulitis of submandibular region 08/10/2017   Odontogenic infection of jaw 08/10/2017   Cholecystitis 02/14/2017   Opioid abuse with opioid-induced mood disorder ([place]) 01/25/2017   Anxiety 02/11/2014   Depression 07/12/2012   PCP:  Merryl Hacker, No Pharmacy:   [facility] [place] [address] Phone: [phone]  Fax: [phone]     Social Determinants of Health (SDOH) Interventions    Readmission Risk Interventions No flowsheet data found.

## 2021-06-25 NOTE — ED Provider Notes (Addendum)
Patient reevaluation is complaining of suicidal thoughts.  She wants to see a Social worker.  We will have her seen by psychiatry and by social work for what I suspect is also social needs.  Medically she has had a very extensive work-up with overall no remarkable findings.  She has been given adequate treatment at this time.  Suspicion is may be for viral gastroenteritis, withdrawal from polysubstance abuse.  We will have her evaluated by psych and social work.  Medically cleared at this time.   Patient cleared by psychiatry and given information for rehab and housing by social work.  Lennice Sites, DO 06/25/21 [id number]    Lennice Sites, DO 06/25/21 [id number]    Lennice Sites, DO 06/25/21 [id number]

## 2021-06-25 NOTE — Discharge Instructions (Addendum)
For your behavioral health needs you are advised to follow up with [facility] at your earliest opportunity:      [facility]      [place], [place] [address]      [phone]      They offer psychiatry/medication management, therapy and substance use disorder treatment.  New patients are seen in their walk-in clinic.  Walk-in hours are Monday - Thursday from 8:00 am - 11:00 am for psychiatry, and Friday from 1:00 pm - 4:00 pm for therapy.  Walk-in patients are seen on a first come, first served basis, so try to arrive as early as possible for the best chance of being seen the same day.  Please note that to be eligible for services you must bring an ID or a piece of mail with your name and a [facility] address.  To help you maintain a sober lifestyle, a substance use disorder treatment program may be beneficial to you.  Contact one of the following providers at your earliest opportunity to ask about enrolling in their program:       [facility]      [address]      [place], [place] [address]      [phone]        [facility]       [place], [place] [address]      [phone]        [facility]       [address] [place], [place] [address]       [phone]        [place], [place] [address]       [phone]       [facility]      [place], [place] [address]      [phone]

## 2021-06-25 NOTE — ED Provider Notes (Addendum)
[facility] DEPT Provider Note   CSN: [id number] Arrival date & time: 06/24/21  2044     History  Chief Complaint  Patient presents with   Abdominal Pain   Chest Pain    Terri [name] is a 46 y.o. female.  The history is provided by the patient.  Abdominal Pain Associated symptoms: chest pain   Chest Pain Associated symptoms: abdominal pain   He has history of hypertension, substance abuse, asthma comes in with a 3-day history of nausea, vomiting, diarrhea.  This is associated with generalized abdominal pain, chest pain, back pain.  She denies fever but has had chills and sweats.  There has also been a nonproductive cough.  She denies any blood in her stool or emesis, but she has had fecal incontinence.  She denies any sick contacts.  She has not done anything to treat her symptoms.  She was seen in the emergency department on 1/31 at which time she had a CT angiogram done but left AMA without receiving any prescriptions.   Home Medications Prior to Admission medications   Medication Sig Start Date End Date Taking? Authorizing Provider  acetaminophen (TYLENOL) 500 MG tablet Take 2 tablets (1,000 mg total) by mouth every 8 (eight) hours as needed for mild pain. 05/29/21   Mariel Aloe, MD  amLODipine (NORVASC) 10 MG tablet Take 1 tablet (10 mg total) by mouth daily. 05/30/21 08/28/21  Jacqlyn Larsen, PA-C  Ferrous Fumarate (IRON) 18 MG TBCR Take 1 tablet (18 mg total) by mouth 2 (two) times daily after a meal. Patient not taking: Reported on 04/18/2021 04/06/21   Domenic Polite, MD  hydrALAZINE (APRESOLINE) 50 MG tablet Take 1 tablet (50 mg total) by mouth 2 (two) times daily. 05/30/21 08/28/21  Jacqlyn Larsen, PA-C  hydrOXYzine (ATARAX/VISTARIL) 25 MG tablet Take 1 tablet (25 mg total) by mouth every 8 (eight) hours as needed for anxiety. Patient not taking: Reported on 04/18/2021 04/06/21   Domenic Polite, MD  lisinopril (ZESTRIL) 10 MG tablet Take 1 tablet  (10 mg total) by mouth daily. 05/30/21 08/28/21  Jacqlyn Larsen, PA-C  methocarbamol (ROBAXIN) 750 MG tablet Take 1 tablet (750 mg total) by mouth every 8 (eight) hours as needed for muscle spasms. Patient not taking: Reported on 04/18/2021 04/06/21   Domenic Polite, MD  nicotine (NICODERM CQ - DOSED IN MG/24 HOURS) 21 mg/24hr patch Place 1 patch (21 mg total) onto the skin daily. Patient not taking: Reported on 04/18/2021 04/07/21   Domenic Polite, MD  ondansetron (ZOFRAN-ODT) 4 MG disintegrating tablet Take 1 tablet (4 mg total) by mouth every 8 (eight) hours as needed for nausea or vomiting. Patient not taking: Reported on 05/23/2021 04/21/21   Tedd Sias, PA  oxyCODONE (OXY IR/ROXICODONE) 5 MG immediate release tablet Take 1 tablet (5 mg total) by mouth every 6 (six) hours as needed for severe pain. 05/29/21   Mariel Aloe, MD  pantoprazole (PROTONIX) 40 MG tablet Take 1 tablet (40 mg total) by mouth daily. Patient not taking: Reported on 05/23/2021 03/18/21   Edwin Dada, MD  vitamin B-12 (CYANOCOBALAMIN) 1000 MCG tablet Take 1 tablet (1,000 mcg total) by mouth daily. Patient not taking: Reported on 04/18/2021 04/06/21   Domenic Polite, MD      Allergies    Nsaids and Ketorolac tromethamine    Review of Systems   Review of Systems  Cardiovascular:  Positive for chest pain.  Gastrointestinal:  Positive for abdominal  pain.  All other systems reviewed and are negative.  Physical Exam Updated Vital Signs BP (!) 183/98    Pulse 86    Temp 97.9 F (36.6 C) (Oral)    Resp 18    SpO2 100%  Physical Exam Vitals and nursing note reviewed.  46 year old female, resting comfortably and in no acute distress. Vital signs are significant for elevated blood pressure. Oxygen saturation is 100%, which is normal. Head is normocephalic and atraumatic. PERRLA, EOMI. Oropharynx is clear. Neck is nontender and supple without adenopathy or JVD. Back is nontender and there is no CVA  tenderness. Lungs are clear without rales, wheezes, or rhonchi. Chest is nontender. Heart has regular rate and rhythm without murmur. Abdomen is soft, flat, with diffuse tenderness.  There is no rebound or guarding.  Peristalsis is hypoactive. Extremities have no cyanosis or edema, full range of motion is present. Skin is warm and dry without rash. Neurologic: Mental status is normal, cranial nerves are intact, moves all extremities equally.  ED Results / Procedures / Treatments   Labs (all labs ordered are listed, but only abnormal results are displayed) Labs Reviewed  CBC WITH DIFFERENTIAL/PLATELET - Abnormal; Notable for the following components:      Result Value   Hemoglobin 10.1 (*)    HCT 29.3 (*)    MCV 71.8 (*)    MCH 24.8 (*)    RDW 20.0 (*)    All other components within normal limits  COMPREHENSIVE METABOLIC PANEL - Abnormal; Notable for the following components:   Sodium 131 (*)    Potassium 3.3 (*)    BUN 22 (*)    Calcium 8.8 (*)    All other components within normal limits  LIPASE, BLOOD  I-STAT BETA HCG BLOOD, ED ([facility], [facility], [facility] ONLY)  TROPONIN I (HIGH SENSITIVITY)  TROPONIN I (HIGH SENSITIVITY)    EKG EKG Interpretation  Date/Time:  Wednesday June 24 2021 21:05:53 EST Ventricular Rate:  74 PR Interval:  141 QRS Duration: 84 QT Interval:  416 QTC Calculation: 462 R Axis:   75 Text Interpretation: Sinus rhythm Biatrial enlargement When compared with ECG of 06/23/2021, No significant change was found Confirmed by Delora Fuel ([id number]) on 06/25/2021 1:05:27 AM  Radiology DG Chest 2 View  Result Date: 06/24/2021 CLINICAL DATA:  cp EXAM: CHEST - 2 VIEW COMPARISON:  Chest x-ray 05/30/2021 FINDINGS: The heart and mediastinal contours are within normal limits. No focal consolidation. No pulmonary edema. No pleural effusion. No pneumothorax. No acute osseous abnormality. IMPRESSION: No active cardiopulmonary disease. Electronically Signed   By: Iven Finn M.D.    On: 06/24/2021 21:24   CT T-SPINE NO CHARGE  Result Date: 06/23/2021 CLINICAL DATA:  Cold symptoms. Back pain and left lower quadrant pain EXAM: CT THORACIC AND LUMBAR SPINE WITHOUT CONTRAST TECHNIQUE: Multidetector CT imaging of the thoracic and lumbar spine was performed without contrast. Multiplanar CT image reconstructions were also generated. RADIATION DOSE REDUCTION: This exam was performed according to the departmental dose-optimization program which includes automated exposure control, adjustment of the mA and/or kV according to patient size and/or use of iterative reconstruction technique. COMPARISON:  CT angio chest 05/28/2021 FINDINGS: CT THORACIC SPINE FINDINGS Alignment: Normal Vertebrae: Negative for fracture or mass Paraspinal and other soft tissues: Negative for paravertebral mass. CT chest findings reported separately. Disc levels: T1-2: Chronic calcified disc protrusion on the right. No significant stenosis T2-3: Small chronic calcific disc protrusion on the right. No significant stenosis. T3-4: Mild spurring on  the right.  No significant stenosis T4-5: Chronic calcified disc protrusion on the right with mild mass-effect on the lateral thecal sac. No significant spinal or foraminal stenosis T5-6: Chronic calcified disc protrusion on the right without significant stenosis. T6-7: Chronic calcified disc protrusion on the right. No significant stenosis T7-8: Chronic calcified disc protrusion on the left. No significant stenosis. T8-9: Negative T9-10: Chronic calcified disc protrusion on the left. No significant spinal or foraminal stenosis. T10-11: Negative T11-12: Mild disc degeneration.  Negative for stenosis. T12-L1: Negative CT LUMBAR SPINE FINDINGS Segmentation: 5 lumbar vertebra Alignment: Normal Vertebrae: Negative for fracture or mass. Extensive sclerotic changes at L3-4 related to disc degeneration. Paraspinal and other soft tissues: Negative for paraspinous mass. CT abdomen pelvis  reported separately. Disc levels: L1-2: Negative L2-3: Mild disc bulging and bilateral facet degeneration. Mild spinal stenosis. L3-4: Advanced disc degeneration with disc space narrowing and extensive discogenic sclerosis. Diffuse endplate spurring and bilateral facet degeneration. Laminotomy. Mild spinal stenosis. Extruded disc fragment extending cranially just to the right of midline is partially calcified and unchanged from MRI 2017. Mild subarticular stenosis bilaterally L4-5: Moderate disc degeneration with disc space narrowing, disc bulging and diffuse endplate spurring. Gas in the disc space and in the posterior annular region. Bilateral laminectomy. Spinal canal decompressed. Mild subarticular stenosis bilaterally. No change from the prior study. L5-S1: Laminotomy with good decompression of thecal sac. Disc degeneration with diffuse endplate spurring. Mild subarticular stenosis bilaterally due to spurring. IMPRESSION: CT THORACIC SPINE IMPRESSION 1. No acute abnormality 2. Multilevel disc degeneration throughout the thoracic spine without significant spinal stenosis. Multiple small calcified disc protrusions are similar to recent CT. CT LUMBAR SPINE IMPRESSION 1. No acute abnormality lumbar spine 2. Multilevel lumbar degenerative change. Laminectomy L2-3 and L3-4. Mild spinal stenosis L3-4. Mild subarticular stenosis bilaterally L3-4, L4-5, and L5-S1. Electronically Signed   By: Franchot Gallo M.D.   On: 06/23/2021 11:05   CT L-SPINE NO CHARGE  Result Date: 06/23/2021 CLINICAL DATA:  Cold symptoms. Back pain and left lower quadrant pain EXAM: CT THORACIC AND LUMBAR SPINE WITHOUT CONTRAST TECHNIQUE: Multidetector CT imaging of the thoracic and lumbar spine was performed without contrast. Multiplanar CT image reconstructions were also generated. RADIATION DOSE REDUCTION: This exam was performed according to the departmental dose-optimization program which includes automated exposure control, adjustment of  the mA and/or kV according to patient size and/or use of iterative reconstruction technique. COMPARISON:  CT angio chest 05/28/2021 FINDINGS: CT THORACIC SPINE FINDINGS Alignment: Normal Vertebrae: Negative for fracture or mass Paraspinal and other soft tissues: Negative for paravertebral mass. CT chest findings reported separately. Disc levels: T1-2: Chronic calcified disc protrusion on the right. No significant stenosis T2-3: Small chronic calcific disc protrusion on the right. No significant stenosis. T3-4: Mild spurring on the right.  No significant stenosis T4-5: Chronic calcified disc protrusion on the right with mild mass-effect on the lateral thecal sac. No significant spinal or foraminal stenosis T5-6: Chronic calcified disc protrusion on the right without significant stenosis. T6-7: Chronic calcified disc protrusion on the right. No significant stenosis T7-8: Chronic calcified disc protrusion on the left. No significant stenosis. T8-9: Negative T9-10: Chronic calcified disc protrusion on the left. No significant spinal or foraminal stenosis. T10-11: Negative T11-12: Mild disc degeneration.  Negative for stenosis. T12-L1: Negative CT LUMBAR SPINE FINDINGS Segmentation: 5 lumbar vertebra Alignment: Normal Vertebrae: Negative for fracture or mass. Extensive sclerotic changes at L3-4 related to disc degeneration. Paraspinal and other soft tissues: Negative for paraspinous mass. CT abdomen  pelvis reported separately. Disc levels: L1-2: Negative L2-3: Mild disc bulging and bilateral facet degeneration. Mild spinal stenosis. L3-4: Advanced disc degeneration with disc space narrowing and extensive discogenic sclerosis. Diffuse endplate spurring and bilateral facet degeneration. Laminotomy. Mild spinal stenosis. Extruded disc fragment extending cranially just to the right of midline is partially calcified and unchanged from MRI 2017. Mild subarticular stenosis bilaterally L4-5: Moderate disc degeneration with disc  space narrowing, disc bulging and diffuse endplate spurring. Gas in the disc space and in the posterior annular region. Bilateral laminectomy. Spinal canal decompressed. Mild subarticular stenosis bilaterally. No change from the prior study. L5-S1: Laminotomy with good decompression of thecal sac. Disc degeneration with diffuse endplate spurring. Mild subarticular stenosis bilaterally due to spurring. IMPRESSION: CT THORACIC SPINE IMPRESSION 1. No acute abnormality 2. Multilevel disc degeneration throughout the thoracic spine without significant spinal stenosis. Multiple small calcified disc protrusions are similar to recent CT. CT LUMBAR SPINE IMPRESSION 1. No acute abnormality lumbar spine 2. Multilevel lumbar degenerative change. Laminectomy L2-3 and L3-4. Mild spinal stenosis L3-4. Mild subarticular stenosis bilaterally L3-4, L4-5, and L5-S1. Electronically Signed   By: Franchot Gallo M.D.   On: 06/23/2021 11:05   CT Angio Chest/Abd/Pel for Dissection W and/or W/WO  Result Date: 06/23/2021 CLINICAL DATA:  Cold symptoms with onset of abdominal pain and diarrhea. Left lower quadrant pain and back pain. Acute aortic syndrome suspected. EXAM: CT ANGIOGRAPHY CHEST, ABDOMEN AND PELVIS TECHNIQUE: Non-contrast CT of the chest was initially obtained. Multidetector CT imaging through the chest, abdomen and pelvis was performed using the standard protocol during bolus administration of intravenous contrast. Multiplanar reconstructed images and MIPs were obtained and reviewed to evaluate the vascular anatomy. RADIATION DOSE REDUCTION: This exam was performed according to the departmental dose-optimization program which includes automated exposure control, adjustment of the mA and/or kV according to patient size and/or use of iterative reconstruction technique. CONTRAST:  14mL OMNIPAQUE IOHEXOL 350 MG/ML SOLN COMPARISON:  Chest CTA 05/30/2021 and 05/28/2021. Abdominopelvic CT 04/18/2021. FINDINGS: CTA CHEST FINDINGS  Cardiovascular: Contrast bolus timing was targeted to aortic assessment. There is suboptimal opacification of the pulmonary arteries. No evidence of acute pulmonary embolism. There is atherosclerosis of the aorta, great vessels and coronary arteries. No evidence of aortic dissection or aneurysm. The heart size is normal. There is no pericardial effusion. Mediastinum/Nodes: There are no enlarged mediastinal, hilar or axillary lymph nodes. The thyroid gland, trachea and esophagus demonstrate no significant findings. Lungs/Pleura: No pleural effusion or pneumothorax. Centrilobular emphysema with mild central airway thickening and scattered linear scarring at both lung bases. No suspicious pulmonary nodules. Musculoskeletal/Chest wall: There is no chest wall mass or suspicious osseous finding. Healed fracture of the left 9th rib laterally. Mild thoracic spondylosis. Review of the MIP images confirms the above findings. CTA ABDOMEN AND PELVIS FINDINGS VASCULAR Aorta: Normal caliber aorta without aneurysm, dissection, vasculitis or significant stenosis. Age advanced atherosclerosis. Celiac: Patent without evidence of aneurysm, dissection, vasculitis or significant stenosis. SMA: Patent without evidence of aneurysm, dissection, vasculitis or significant stenosis. Renals: Both renal arteries are patent without evidence of aneurysm, dissection, vasculitis, fibromuscular dysplasia or significant stenosis. IMA: Patent without evidence of aneurysm, dissection, vasculitis or significant stenosis. Inflow: Patent without evidence of aneurysm, dissection, vasculitis or significant stenosis. Age advanced atherosclerosis. Veins: Limited venous opacification.  No apparent abnormality. Review of the MIP images confirms the above findings. NON-VASCULAR Hepatobiliary: No suspicious findings as imaged in the arterial phase. Subcapsular cyst peripherally in the right hepatic lobe is unchanged. Stable mild  biliary dilatation status post  cholecystectomy. Pancreas: Unremarkable. No pancreatic ductal dilatation or surrounding inflammatory changes. Spleen: Normal in size without focal abnormality. Adrenals/Urinary Tract: Both adrenal glands appear normal. There is cortical scarring of both kidneys. No evidence of renal or ureteral calculus. There is no hydronephrosis or perinephric soft tissue stranding. 8 mm calcified lesion along the posterior wall of the bladder has mildly enlarged compared with the previous abdominopelvic CT, probably a bladder calculus, although potentially a calcified lesion in the posterior bladder wall. No other bladder abnormality identified. Stomach/Bowel: No enteric contrast administered. 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 No bowel wall thickening or significant distention identified. Lymphatic: Nodal assessment limited by paucity of intra-abdominal fat. No enlarged abdominopelvic lymph nodes identified. Reproductive: The uterus and ovaries appear unremarkable. No adnexal mass. Other: Paucity of intra-abdominal fat with generalized edema throughout the subcutaneous and intra-abdominal fat. There is a small amount of ascites. No focal fluid collection or free air identified. Musculoskeletal: No acute osseous findings. Multilevel spondylosis status post multilevel posterior decompression. Review of the MIP images confirms the above findings. IMPRESSION: 1. No acute vascular findings identified within the chest, abdomen or pelvis. 2. Age advanced coronary and Aortic Atherosclerosis (ICD10-I70.0). No evidence of large vessel occlusion or significant vascular stenosis. 3.  Emphysema (ICD10-J43.9). 4. Bilateral renal cortical scarring. Suspected enlarging calculus dependently in the bladder versus partially calcified lesion in the posterior wall of the bladder. 5. Generalized soft tissue edema and ascites suspicious for anasarca. Electronically Signed   By: Richardean Sale M.D.   On:  06/23/2021 11:15    Procedures Procedures    Medications Ordered in ED Medications  morphine (PF) 4 MG/ML injection 4 mg (has no administration in time range)  ondansetron (ZOFRAN) injection 4 mg (has no administration in time range)  lactated ringers bolus 1,000 mL (has no administration in time range)  loperamide (IMODIUM) capsule 4 mg (has no administration in time range)  potassium chloride SA (KLOR-CON M) CR tablet 40 mEq (has no administration in time range)    ED Course/ Medical Decision Making/ A&P                           Medical Decision Making Risk Prescription drug management.   Nausea, vomiting, diarrhea, generalized body aches.  This most likely represents a viral gastroenteritis, possible food poisoning.  Doubt bowel obstruction.  Old records are reviewed confirming ED visit on 1/31 at which time CT angiogram of chest, abdomen, pelvis showed no acute findings.  Labs at that time showed significant hypokalemia with potassium of 2.8.  Patient left AGAINST MEDICAL ADVICE before physician could talk with her.  Labs today show normal WBC with normal differential, mild hyponatremia which is not felt to be clinically significant.  Hypokalemia still present but improved with potassium 3.3.  There has been a slight drop in hemoglobin from 11.6 to 10.1, but hemoglobin had been as low as 9.0 on 12/31.  She will be given IV fluids, ondansetron, morphine, oral loperamide and reassessed.  She will also be given a dose of oral potassium.  Case is signed out to Dr. Ronnald Nian.   Final Clinical Impression(s) / ED Diagnoses Final diagnoses:  Nausea vomiting and diarrhea  Generalized abdominal pain  Hyponatremia  Hypokalemia  Microcytic anemia    Rx / DC Orders ED Discharge Orders     None         Delora Fuel, MD 29/56/21 [phone]  Delora Fuel, MD 65/99/35 [phone]

## 2021-06-25 NOTE — ED Notes (Signed)
2 bags of patient belongings labeled and placed in patient belonging cabinets behind nurse station, cabinet 23-25.

## 2021-06-25 NOTE — ED Notes (Signed)
Pt walked to and from restroom with no assist

## 2021-06-25 NOTE — ED Notes (Signed)
Patient declined vital signs for discharge.

## 2021-06-28 ENCOUNTER — Emergency Department (HOSPITAL_COMMUNITY): Payer: Medicaid Other

## 2021-06-28 ENCOUNTER — Other Ambulatory Visit: Payer: Self-pay

## 2021-06-28 ENCOUNTER — Emergency Department (HOSPITAL_COMMUNITY)
Admission: EM | Admit: 2021-06-28 | Discharge: 2021-06-28 | Disposition: A | Discharge status: Home / Self Care | Payer: Medicaid Other | Attending: Emergency Medicine | Admitting: Emergency Medicine

## 2021-06-28 DIAGNOSIS — E876 Hypokalemia: Secondary | ICD-10-CM | POA: Diagnosis not present

## 2021-06-28 DIAGNOSIS — Z79899 Other long term (current) drug therapy: Secondary | ICD-10-CM | POA: Insufficient documentation

## 2021-06-28 DIAGNOSIS — J181 Lobar pneumonia, unspecified organism: Secondary | ICD-10-CM | POA: Diagnosis not present

## 2021-06-28 DIAGNOSIS — R197 Diarrhea, unspecified: Secondary | ICD-10-CM | POA: Diagnosis not present

## 2021-06-28 DIAGNOSIS — E871 Hypo-osmolality and hyponatremia: Secondary | ICD-10-CM | POA: Diagnosis not present

## 2021-06-28 DIAGNOSIS — R0602 Shortness of breath: Secondary | ICD-10-CM | POA: Diagnosis present

## 2021-06-28 DIAGNOSIS — J189 Pneumonia, unspecified organism: Secondary | ICD-10-CM

## 2021-06-28 DIAGNOSIS — D649 Anemia, unspecified: Secondary | ICD-10-CM | POA: Diagnosis not present

## 2021-06-28 LAB — CBC WITH DIFFERENTIAL/PLATELET
Abs Immature Granulocytes: 0.13 10*3/uL — ABNORMAL HIGH (ref 0.00–0.07)
Basophils Absolute: 0 10*3/uL (ref 0.0–0.1)
Basophils Relative: 0 %
Eosinophils Absolute: 0 10*3/uL (ref 0.0–0.5)
Eosinophils Relative: 0 %
HCT: 35.3 % — ABNORMAL LOW (ref 36.0–46.0)
Hemoglobin: 11.9 g/dL — ABNORMAL LOW (ref 12.0–15.0)
Immature Granulocytes: 1 %
Lymphocytes Relative: 6 %
Lymphs Abs: 1.2 10*3/uL (ref 0.7–4.0)
MCH: 23.6 pg — ABNORMAL LOW (ref 26.0–34.0)
MCHC: 33.7 g/dL (ref 30.0–36.0)
MCV: 70 fL — ABNORMAL LOW (ref 80.0–100.0)
Monocytes Absolute: 0.7 10*3/uL (ref 0.1–1.0)
Monocytes Relative: 4 %
Neutro Abs: 17.5 10*3/uL — ABNORMAL HIGH (ref 1.7–7.7)
Neutrophils Relative %: 89 %
Platelets: 310 10*3/uL (ref 150–400)
RBC: 5.04 MIL/uL (ref 3.87–5.11)
RDW: 20.1 % — ABNORMAL HIGH (ref 11.5–15.5)
WBC: 19.6 10*3/uL — ABNORMAL HIGH (ref 4.0–10.5)
nRBC: 0 % (ref 0.0–0.2)

## 2021-06-28 LAB — COMPREHENSIVE METABOLIC PANEL
ALT: 10 U/L (ref 0–44)
AST: 12 U/L — ABNORMAL LOW (ref 15–41)
Albumin: 3.5 g/dL (ref 3.5–5.0)
Alkaline Phosphatase: 85 U/L (ref 38–126)
Anion gap: 10 (ref 5–15)
BUN: 26 mg/dL — ABNORMAL HIGH (ref 6–20)
CO2: 27 mmol/L (ref 22–32)
Calcium: 9 mg/dL (ref 8.9–10.3)
Chloride: 93 mmol/L — ABNORMAL LOW (ref 98–111)
Creatinine, Ser: 0.99 mg/dL (ref 0.44–1.00)
GFR, Estimated: >60 mL/min (ref >60)
Glucose, Bld: 80 mg/dL (ref 70–99)
Potassium: 3.2 mmol/L — ABNORMAL LOW (ref 3.5–5.1)
Sodium: 130 mmol/L — ABNORMAL LOW (ref 135–145)
Total Bilirubin: 1 mg/dL (ref 0.3–1.2)
Total Protein: 7.2 g/dL (ref 6.5–8.1)

## 2021-06-28 LAB — TROPONIN I (HIGH SENSITIVITY)
Troponin I (High Sensitivity): 11 ng/L (ref <18)
Troponin I (High Sensitivity): 11 ng/L (ref <18)

## 2021-06-28 LAB — LIPASE, BLOOD: Lipase: 27 U/L (ref 11–51)

## 2021-06-28 MED ORDER — POTASSIUM CHLORIDE CRYS ER 20 MEQ PO TBCR
40.0000 meq | EXTENDED_RELEASE_TABLET | Freq: Once | ORAL | Status: AC
Start: 2021-06-28 — End: 2021-06-28
  Administered 2021-06-28: 40 meq via ORAL
  Filled 2021-06-28: qty 2

## 2021-06-28 MED ORDER — LISINOPRIL 10 MG PO TABS
10.0000 mg | ORAL_TABLET | Freq: Once | ORAL | Status: AC
  Administered 2021-06-28: 10 mg via ORAL
  Filled 2021-06-28: qty 1

## 2021-06-28 MED ORDER — ALBUTEROL SULFATE HFA 108 (90 BASE) MCG/ACT IN AERS: 2.0000 | INHALATION_SPRAY | RESPIRATORY_TRACT | Status: DC | PRN

## 2021-06-28 MED ORDER — AMLODIPINE BESYLATE 5 MG PO TABS
10.0000 mg | ORAL_TABLET | Freq: Once | ORAL | Status: AC
Start: 2021-06-28 — End: 2021-06-28
  Administered 2021-06-28: 10 mg via ORAL
  Filled 2021-06-28: qty 2

## 2021-06-28 MED ORDER — MORPHINE SULFATE (PF) 4 MG/ML IV SOLN
4.0000 mg | Freq: Once | INTRAVENOUS | Status: AC
Start: 2021-06-28 — End: 2021-06-28
  Administered 2021-06-28: 4 mg via INTRAVENOUS
  Filled 2021-06-28: qty 1

## 2021-06-28 MED ORDER — LACTATED RINGERS IV BOLUS
1000.0000 mL | Freq: Once | INTRAVENOUS | Status: AC
  Administered 2021-06-28: 1000 mL via INTRAVENOUS

## 2021-06-28 NOTE — ED Triage Notes (Signed)
Pt BIBA. Pt is homeless and c/o SOB, dizziness, and chest pain 10/10. Pt states they were "throwing up brown stuff" yesterday.  Pt Aox4  Pt states hx of emphysema   Was 88% on RA when EMS arrives. Placed on 10L O2.  Satting at 98 on RA in ED.

## 2021-06-28 NOTE — ED Notes (Signed)
Pt asked to leave. Pt educated about risks of leaving before being cleared. Pt understood. IV removed, pt given disposable scrub pants. AMA papers signed.

## 2021-06-28 NOTE — ED Provider Notes (Signed)
[facility] DEPT Provider Note   CSN: [id number] Arrival date & time: 06/28/21  0703     History  Chief Complaint  Patient presents with   Shortness of Breath   Chest Pain    Terri [name] is a 46 y.o. female.  Patient with histo hypertension, DVT, chronic back pain, migraines, bipolar disorder, polysubstance abuse, who presents for evaluation of chest pain and shortness of breath.  She states that same began acutely this morning around 4 AM and awoke her from sleep. She also endorses associated left-sided chest pain that 10/10 in nature and is worse on inspiration. She also endorses a productive cough with yellow sputum. She also endorses significant diarrhea and incontinence of stool which has been ongoing for the past month. Patient also states that she is not taking any of her home meds because of financial hardship. This includes many hypertensive medications.  Upon chart review, it appears patient has been seen several times over the past few months with 4 CT chest angiograms  all unremarkable for acute findings with the last on 1/31.    The history is provided by the patient. No language interpreter was used.  Shortness of Breath Associated symptoms: chest pain   Associated symptoms: no fever   Chest Pain Associated symptoms: shortness of breath   Associated symptoms: no fever       Home Medications Prior to Admission medications   Medication Sig Start Date End Date Taking? Authorizing Provider  acetaminophen (TYLENOL) 500 MG tablet Take 2 tablets (1,000 mg total) by mouth every 8 (eight) hours as needed for mild pain. 05/29/21   Mariel Aloe, MD  amLODipine (NORVASC) 10 MG tablet Take 1 tablet (10 mg total) by mouth daily. Patient not taking: Reported on 06/25/2021 05/30/21 08/28/21  Jacqlyn Larsen, PA-C  Ferrous Fumarate (IRON) 18 MG TBCR Take 1 tablet (18 mg total) by mouth 2 (two) times daily after a meal. Patient not taking: Reported on  06/25/2021 04/06/21   Domenic Polite, MD  hydrALAZINE (APRESOLINE) 50 MG tablet Take 1 tablet (50 mg total) by mouth 2 (two) times daily. Patient not taking: Reported on 06/25/2021 05/30/21 08/28/21  Jacqlyn Larsen, PA-C  hydrOXYzine (ATARAX/VISTARIL) 25 MG tablet Take 1 tablet (25 mg total) by mouth every 8 (eight) hours as needed for anxiety. Patient not taking: Reported on 06/25/2021 04/06/21   Domenic Polite, MD  lisinopril (ZESTRIL) 10 MG tablet Take 1 tablet (10 mg total) by mouth daily. Patient not taking: Reported on 06/25/2021 05/30/21 08/28/21  Jacqlyn Larsen, PA-C  methocarbamol (ROBAXIN) 750 MG tablet Take 1 tablet (750 mg total) by mouth every 8 (eight) hours as needed for muscle spasms. Patient not taking: Reported on 06/25/2021 04/06/21   Domenic Polite, MD  nicotine (NICODERM CQ - DOSED IN MG/24 HOURS) 21 mg/24hr patch Place 1 patch (21 mg total) onto the skin daily. Patient not taking: Reported on 06/25/2021 04/07/21   Domenic Polite, MD  ondansetron (ZOFRAN) 4 MG tablet Take 1 tablet (4 mg total) by mouth every 8 (eight) hours as needed for up to 20 doses for nausea or vomiting. 06/25/21   Curatolo, Adam, DO  ondansetron (ZOFRAN-ODT) 4 MG disintegrating tablet Take 1 tablet (4 mg total) by mouth every 8 (eight) hours as needed for nausea or vomiting. Patient not taking: Reported on 06/25/2021 04/21/21   Tedd Sias, PA  oxyCODONE (OXY IR/ROXICODONE) 5 MG immediate release tablet Take 1 tablet (5 mg total) by  mouth every 6 (six) hours as needed for severe pain. Patient not taking: Reported on 06/25/2021 05/29/21   Mariel Aloe, MD  pantoprazole (PROTONIX) 40 MG tablet Take 1 tablet (40 mg total) by mouth daily. Patient not taking: Reported on 06/25/2021 03/18/21   Edwin Dada, MD  vitamin B-12 (CYANOCOBALAMIN) 1000 MCG tablet Take 1 tablet (1,000 mcg total) by mouth daily. Patient not taking: Reported on 06/25/2021 04/06/21   Domenic Polite, MD      Allergies    Nsaids and Ketorolac  tromethamine    Review of Systems   Review of Systems  Constitutional:  Negative for chills and fever.  Respiratory:  Positive for shortness of breath.   Cardiovascular:  Positive for chest pain.  Gastrointestinal:  Positive for diarrhea.  All other systems reviewed and are negative.  Physical Exam Updated Vital Signs BP (!) 178/115 (BP Location: Left Arm)    Pulse 85    Temp 98.5 F (36.9 C) (Oral)    Resp 18    SpO2 98%  Physical Exam Vitals and nursing note reviewed.  Constitutional:      Comments: Patient chronically ill appearing resting in bed in no acute distress  HENT:     Head: Normocephalic and atraumatic.  Eyes:     Extraocular Movements: Extraocular movements intact.     Pupils: Pupils are equal, round, and reactive to light.  Cardiovascular:     Rate and Rhythm: Normal rate and regular rhythm.  Pulmonary:     Effort: Pulmonary effort is normal.     Breath sounds: Normal breath sounds.  Chest:     Chest wall: No tenderness.  Abdominal:     General: Bowel sounds are normal.     Palpations: Abdomen is soft.  Musculoskeletal:     Cervical back: Normal range of motion.     Right lower leg: No tenderness. No edema.     Left lower leg: No tenderness. No edema.  Skin:    General: Skin is warm and dry.  Neurological:     General: No focal deficit present.     Mental Status: She is alert.  Psychiatric:        Mood and Affect: Mood normal.        Behavior: Behavior normal.    ED Results / Procedures / Treatments   Labs (all labs ordered are listed, but only abnormal results are displayed) Labs Reviewed  COMPREHENSIVE METABOLIC PANEL - Abnormal; Notable for the following components:      Result Value   Sodium 130 (*)    Potassium 3.2 (*)    Chloride 93 (*)    BUN 26 (*)    AST 12 (*)    All other components within normal limits  CBC WITH DIFFERENTIAL/PLATELET - Abnormal; Notable for the following components:   WBC 19.6 (*)    Hemoglobin 11.9 (*)    HCT  35.3 (*)    MCV 70.0 (*)    MCH 23.6 (*)    RDW 20.1 (*)    Neutro Abs 17.5 (*)    Abs Immature Granulocytes 0.13 (*)    All other components within normal limits  LIPASE, BLOOD  TROPONIN I (HIGH SENSITIVITY)  TROPONIN I (HIGH SENSITIVITY)    EKG EKG Interpretation  Date/Time:  Sunday June 28 2021 07:35:38 EST Ventricular Rate:  84 PR Interval:  105 QRS Duration: 85 QT Interval:  344 QTC Calculation: 407 R Axis:   83 Text Interpretation: Sinus rhythm Atrial  premature complex Short PR interval Probable left atrial enlargement LVH with secondary repolarization abnormality Anterior ST elevation, probably due to LVH similar to EKG from 06/23/21 Confirmed by Malvin Johns [phone]) on 06/28/2021 7:55:26 AM  Radiology DG Chest 2 View  Result Date: 06/28/2021 CLINICAL DATA:  Shortness of breath and productive cough for several days. EXAM: CHEST - 2 VIEW COMPARISON:  06/24/2021 FINDINGS: Normal heart size. No pleural effusion. Lungs are hyperinflated. Interval development of patchy airspace densities within the left lower lobe concerning for pneumonia. Visualized osseous structures appear unremarkable. IMPRESSION: Left lower lobe pneumonia. Electronically Signed   By: Kerby Moors M.D.   On: 06/28/2021 08:20    Procedures Procedures    Medications Ordered in ED Medications  amLODipine (NORVASC) tablet 10 mg (10 mg Oral Given 06/28/21 1019)  lisinopril (ZESTRIL) tablet 10 mg (10 mg Oral Given 06/28/21 1019)  potassium chloride SA (KLOR-CON M) CR tablet 40 mEq (40 mEq Oral Given 06/28/21 1020)  lactated ringers bolus 1,000 mL (0 mLs Intravenous Stopped 06/28/21 1125)  morphine (PF) 4 MG/ML injection 4 mg (4 mg Intravenous Given 06/28/21 1026)    ED Course/ Medical Decision Making/ A&P                           Medical Decision Making Amount and/or Complexity of Data Reviewed Labs: ordered. Radiology: ordered.  Risk Prescription drug management.   Patient presents today with  shortness of breath and chest pain.   I, Lavonna Rua, PA-C, personally reviewed and evaluated these images and lab results supported by medical decision making  Labs reveal hyponatremia at 130, hypokalemia at 3.2, leukocytosis at 19.6 up from 9.0 4 days ago. Anemia at 11.9 per baseline. Troponins negative  CXR reveals left lower lobe pneumonia.  EKG unchanged from previous on 1/31.  Patient given oral potassium for hypokalemia with home blood pressure medications with morphine for pain.   Ordered Rocephin for left lower lobe pneumonia, however I was informed that the patient was planning to leave [place] . I was unable to reasess or speak to her prior to her leaving AMA.   Final Clinical Impression(s) / ED Diagnoses Final diagnoses:  Pneumonia of left lower lobe due to infectious organism    Rx / DC Orders ED Discharge Orders     None         Nestor Lewandowsky 06/29/21 2218    Malvin Johns, MD 07/02/21 [phone]

## 2021-06-28 NOTE — ED Notes (Signed)
Attempted blood draw x2 without success.

## 2021-07-02 ENCOUNTER — Emergency Department (HOSPITAL_COMMUNITY)
Admission: EM | Admit: 2021-07-02 | Discharge: 2021-07-02 | Disposition: A | Discharge status: Home / Self Care | Payer: Medicaid Other | Attending: Emergency Medicine | Admitting: Emergency Medicine

## 2021-07-02 DIAGNOSIS — R079 Chest pain, unspecified: Secondary | ICD-10-CM | POA: Diagnosis present

## 2021-07-02 DIAGNOSIS — Z5321 Procedure and treatment not carried out due to patient leaving prior to being seen by health care provider: Secondary | ICD-10-CM | POA: Diagnosis not present

## 2021-07-02 DIAGNOSIS — M549 Dorsalgia, unspecified: Secondary | ICD-10-CM | POA: Diagnosis not present

## 2021-07-02 DIAGNOSIS — R0602 Shortness of breath: Secondary | ICD-10-CM | POA: Diagnosis not present

## 2021-07-03 ENCOUNTER — Emergency Department (HOSPITAL_COMMUNITY): Payer: Medicaid Other

## 2021-07-03 ENCOUNTER — Observation Stay (HOSPITAL_COMMUNITY)
Admission: EM | Admit: 2021-07-03 | Discharge: 2021-07-04 | Disposition: A | Discharge status: Home / Self Care | Payer: Medicaid Other | Attending: Family Medicine | Admitting: Family Medicine

## 2021-07-03 ENCOUNTER — Encounter (HOSPITAL_COMMUNITY): Payer: Self-pay | Admitting: *Deleted

## 2021-07-03 ENCOUNTER — Other Ambulatory Visit: Payer: Self-pay

## 2021-07-03 DIAGNOSIS — Z20822 Contact with and (suspected) exposure to covid-19: Secondary | ICD-10-CM | POA: Insufficient documentation

## 2021-07-03 DIAGNOSIS — B9689 Other specified bacterial agents as the cause of diseases classified elsewhere: Secondary | ICD-10-CM | POA: Diagnosis not present

## 2021-07-03 DIAGNOSIS — J168 Pneumonia due to other specified infectious organisms: Secondary | ICD-10-CM | POA: Diagnosis not present

## 2021-07-03 DIAGNOSIS — Z79899 Other long term (current) drug therapy: Secondary | ICD-10-CM | POA: Insufficient documentation

## 2021-07-03 DIAGNOSIS — E538 Deficiency of other specified B group vitamins: Secondary | ICD-10-CM

## 2021-07-03 DIAGNOSIS — F319 Bipolar disorder, unspecified: Secondary | ICD-10-CM

## 2021-07-03 DIAGNOSIS — Z9884 Bariatric surgery status: Secondary | ICD-10-CM

## 2021-07-03 DIAGNOSIS — F1910 Other psychoactive substance abuse, uncomplicated: Secondary | ICD-10-CM

## 2021-07-03 DIAGNOSIS — N390 Urinary tract infection, site not specified: Secondary | ICD-10-CM

## 2021-07-03 DIAGNOSIS — A419 Sepsis, unspecified organism: Principal | ICD-10-CM | POA: Diagnosis present

## 2021-07-03 DIAGNOSIS — R0602 Shortness of breath: Secondary | ICD-10-CM | POA: Diagnosis present

## 2021-07-03 DIAGNOSIS — D509 Iron deficiency anemia, unspecified: Secondary | ICD-10-CM | POA: Diagnosis not present

## 2021-07-03 DIAGNOSIS — J45909 Unspecified asthma, uncomplicated: Secondary | ICD-10-CM | POA: Insufficient documentation

## 2021-07-03 DIAGNOSIS — I10 Essential (primary) hypertension: Secondary | ICD-10-CM | POA: Diagnosis not present

## 2021-07-03 DIAGNOSIS — J189 Pneumonia, unspecified organism: Secondary | ICD-10-CM

## 2021-07-03 HISTORY — DX: Unspecified mood (affective) disorder: F39

## 2021-07-03 LAB — URINALYSIS, ROUTINE W REFLEX MICROSCOPIC
Glucose, UA: NEGATIVE mg/dL
Ketones, ur: NEGATIVE mg/dL
Nitrite: NEGATIVE
Protein, ur: 100 mg/dL — AB
Specific Gravity, Urine: 1.02 (ref 1.005–1.030)
pH: 5 (ref 5.0–8.0)

## 2021-07-03 LAB — COMPREHENSIVE METABOLIC PANEL
ALT: 10 U/L (ref 0–44)
AST: 15 U/L (ref 15–41)
Albumin: 2.8 g/dL — ABNORMAL LOW (ref 3.5–5.0)
Alkaline Phosphatase: 91 U/L (ref 38–126)
Anion gap: 10 (ref 5–15)
BUN: 23 mg/dL — ABNORMAL HIGH (ref 6–20)
CO2: 25 mmol/L (ref 22–32)
Calcium: 8.9 mg/dL (ref 8.9–10.3)
Chloride: 94 mmol/L — ABNORMAL LOW (ref 98–111)
Creatinine, Ser: 1.1 mg/dL — ABNORMAL HIGH (ref 0.44–1.00)
GFR, Estimated: >60 mL/min (ref >60)
Glucose, Bld: 70 mg/dL (ref 70–99)
Potassium: 3.7 mmol/L (ref 3.5–5.1)
Sodium: 129 mmol/L — ABNORMAL LOW (ref 135–145)
Total Bilirubin: 0.6 mg/dL (ref 0.3–1.2)
Total Protein: 6.4 g/dL — ABNORMAL LOW (ref 6.5–8.1)

## 2021-07-03 LAB — RESP PANEL BY RT-PCR (FLU A&B, COVID) ARPGX2
Influenza A by PCR: NEGATIVE
Influenza B by PCR: NEGATIVE
SARS Coronavirus 2 by RT PCR: NEGATIVE

## 2021-07-03 LAB — CBC WITH DIFFERENTIAL/PLATELET
Abs Immature Granulocytes: 0.07 10*3/uL (ref 0.00–0.07)
Basophils Absolute: 0 10*3/uL (ref 0.0–0.1)
Basophils Relative: 0 %
Eosinophils Absolute: 0 10*3/uL (ref 0.0–0.5)
Eosinophils Relative: 0 %
HCT: 30.2 % — ABNORMAL LOW (ref 36.0–46.0)
Hemoglobin: 10.2 g/dL — ABNORMAL LOW (ref 12.0–15.0)
Immature Granulocytes: 0 %
Lymphocytes Relative: 11 %
Lymphs Abs: 1.9 10*3/uL (ref 0.7–4.0)
MCH: 23.8 pg — ABNORMAL LOW (ref 26.0–34.0)
MCHC: 33.8 g/dL (ref 30.0–36.0)
MCV: 70.6 fL — ABNORMAL LOW (ref 80.0–100.0)
Monocytes Absolute: 1.2 10*3/uL — ABNORMAL HIGH (ref 0.1–1.0)
Monocytes Relative: 7 %
Neutro Abs: 14.4 10*3/uL — ABNORMAL HIGH (ref 1.7–7.7)
Neutrophils Relative %: 82 %
Platelets: 443 10*3/uL — ABNORMAL HIGH (ref 150–400)
RBC: 4.28 MIL/uL (ref 3.87–5.11)
RDW: 19.2 % — ABNORMAL HIGH (ref 11.5–15.5)
WBC: 17.6 10*3/uL — ABNORMAL HIGH (ref 4.0–10.5)
nRBC: 0 % (ref 0.0–0.2)

## 2021-07-03 LAB — APTT: aPTT: 26 seconds (ref 24–36)

## 2021-07-03 LAB — PROTIME-INR
INR: 1.1 (ref 0.8–1.2)
Prothrombin Time: 13.9 seconds (ref 11.4–15.2)

## 2021-07-03 LAB — LACTIC ACID, PLASMA: Lactic Acid, Venous: 0.9 mmol/L (ref 0.5–1.9)

## 2021-07-03 MED ORDER — LACTATED RINGERS IV SOLN: INTRAVENOUS | Status: DC

## 2021-07-03 MED ORDER — SODIUM CHLORIDE 0.9 % IV SOLN
2.0000 g | INTRAVENOUS | Status: DC
  Administered 2021-07-03: 2 g via INTRAVENOUS
  Filled 2021-07-03: qty 20

## 2021-07-03 MED ORDER — LACTATED RINGERS IV BOLUS (SEPSIS)
1000.0000 mL | Freq: Once | INTRAVENOUS | Status: AC
  Administered 2021-07-03: 1000 mL via INTRAVENOUS

## 2021-07-03 MED ORDER — SODIUM CHLORIDE 0.9 % IV SOLN
500.0000 mg | INTRAVENOUS | Status: DC
  Administered 2021-07-03: 22:00:00 500 mg via INTRAVENOUS
  Filled 2021-07-03: qty 5

## 2021-07-03 MED ORDER — ENOXAPARIN SODIUM 40 MG/0.4ML IJ SOSY
40.0000 mg | PREFILLED_SYRINGE | Freq: Every day | INTRAMUSCULAR | Status: DC
  Filled 2021-07-03: qty 0.4

## 2021-07-03 MED ORDER — ACETAMINOPHEN 500 MG PO TABS
1000.0000 mg | ORAL_TABLET | Freq: Once | ORAL | Status: AC
  Administered 2021-07-03: 1000 mg via ORAL
  Filled 2021-07-03: qty 2

## 2021-07-03 MED ORDER — ENSURE ENLIVE PO LIQD: 237.0000 mL | Freq: Two times a day (BID) | ORAL | Status: DC

## 2021-07-03 NOTE — Sepsis Progress Note (Signed)
Following per sepsis protocol   

## 2021-07-03 NOTE — ED Triage Notes (Signed)
The pt is c/o chest pain and sob for a long time  she has been seen last pm at [facility]  the pt is homeless   lmp none

## 2021-07-03 NOTE — ED Provider Triage Note (Addendum)
Emergency Medicine Provider Triage Evaluation Note  Terri [name] , a 46 y.o. female  was evaluated in triage.  Pt complains of chest pain and shortness of breath.  Reports cough which is productive with brown, black, pink, green sputum.  Symptoms have been ongoing for several months, has been to [facility] multiple times with no source found.  Patient is very frustrated and tearful.  Review of Systems  Positive: Shortness of breath, cough, chest pain Negative: Leanna Sato  Physical Exam  LMP  (LMP Unknown)  Gen:   Awake, no distress   Resp:  Normal effort  MSK:   Moves extremities without difficulty  Other:  Tearful, lungs clear  Medical Decision Making  Medically screening exam initiated at 6:29 PM.  Appropriate orders placed.  [name] was informed that the remainder of the evaluation will be completed by another provider, this initial triage assessment does not replace that evaluation, and the importance of remaining in the ED until their evaluation is complete.  Review of record, seen at [facility] 06/28/2021 and found to have PNA but left before she was treated. Due to abnormal VS (possibly due to her anxious state and hyperventilating/crying) will recheck labs and CXR.   [name], PA-C 07/03/21 1843    [name], PA-C 07/03/21 1845

## 2021-07-03 NOTE — ED Provider Notes (Signed)
[facility] EMERGENCY DEPARTMENT Provider Note   CSN: [id number] Arrival date & time: 07/03/21  1815     History  Chief Complaint  Patient presents with   Chest Pain    Terri [name] is a 46 y.o. female.  The history is provided by the patient and medical records. No language interpreter was used.  Chest Pain  This is a 46 year old female with significant history recurrent sepsis, hypertension, polysubstance abuse, anemia, asthma presenting with cold symptoms.  Patient endorsed for the past several weeks she has been feeling sick.  She endorsed having fever chills chest pain shortness of breath productive cough with green to blood-tinged sputum, pain in her chest with coughing, decrease in appetite, and overall not feeling well.  She endorses body aches, posttussive emesis.  States she has been seen in the ED several times for her complaint but was told that she does not have any infection.  Her symptoms still persist prompting this ER visit.  She is unsure when was the last COVID or flu shot.  She tried over-the-counter medication without relief.  Home Medications Prior to Admission medications   Medication Sig Start Date End Date Taking? Authorizing Provider  acetaminophen (TYLENOL) 500 MG tablet Take 2 tablets (1,000 mg total) by mouth every 8 (eight) hours as needed for mild pain. Patient not taking: Reported on 06/28/2021 05/29/21   Mariel Aloe, MD  amLODipine (NORVASC) 10 MG tablet Take 1 tablet (10 mg total) by mouth daily. Patient not taking: Reported on 06/25/2021 05/30/21 08/28/21  Jacqlyn Larsen, PA-C  Ferrous Fumarate (IRON) 18 MG TBCR Take 1 tablet (18 mg total) by mouth 2 (two) times daily after a meal. Patient not taking: Reported on 06/25/2021 04/06/21   Domenic Polite, MD  hydrALAZINE (APRESOLINE) 50 MG tablet Take 1 tablet (50 mg total) by mouth 2 (two) times daily. Patient not taking: Reported on 06/25/2021 05/30/21 08/28/21  Jacqlyn Larsen, PA-C  hydrOXYzine  (ATARAX/VISTARIL) 25 MG tablet Take 1 tablet (25 mg total) by mouth every 8 (eight) hours as needed for anxiety. Patient not taking: Reported on 06/25/2021 04/06/21   Domenic Polite, MD  lisinopril (ZESTRIL) 10 MG tablet Take 1 tablet (10 mg total) by mouth daily. Patient not taking: Reported on 06/25/2021 05/30/21 08/28/21  Jacqlyn Larsen, PA-C  methocarbamol (ROBAXIN) 750 MG tablet Take 1 tablet (750 mg total) by mouth every 8 (eight) hours as needed for muscle spasms. Patient not taking: Reported on 06/25/2021 04/06/21   Domenic Polite, MD  nicotine (NICODERM CQ - DOSED IN MG/24 HOURS) 21 mg/24hr patch Place 1 patch (21 mg total) onto the skin daily. Patient not taking: Reported on 06/25/2021 04/07/21   Domenic Polite, MD  ondansetron (ZOFRAN) 4 MG tablet Take 1 tablet (4 mg total) by mouth every 8 (eight) hours as needed for up to 20 doses for nausea or vomiting. Patient not taking: Reported on 06/28/2021 06/25/21   Lennice Sites, DO  ondansetron (ZOFRAN-ODT) 4 MG disintegrating tablet Take 1 tablet (4 mg total) by mouth every 8 (eight) hours as needed for nausea or vomiting. Patient not taking: Reported on 06/25/2021 04/21/21   Tedd Sias, PA  oxyCODONE (OXY IR/ROXICODONE) 5 MG immediate release tablet Take 1 tablet (5 mg total) by mouth every 6 (six) hours as needed for severe pain. Patient not taking: Reported on 06/25/2021 05/29/21   Mariel Aloe, MD  pantoprazole (PROTONIX) 40 MG tablet Take 1 tablet (40 mg total) by mouth  daily. Patient not taking: Reported on 06/25/2021 03/18/21   Edwin Dada, MD  vitamin B-12 (CYANOCOBALAMIN) 1000 MCG tablet Take 1 tablet (1,000 mcg total) by mouth daily. Patient not taking: Reported on 06/25/2021 04/06/21   Domenic Polite, MD      Allergies    Nsaids and Ketorolac tromethamine    Review of Systems   Review of Systems  Cardiovascular:  Positive for chest pain.  All other systems reviewed and are negative.  Physical Exam Updated Vital Signs BP  (!) 143/118 (BP Location: Right Arm)    Pulse (!) 119    Temp 99.1 F (37.3 C) (Oral)    Resp (!) 28    Ht 5\' 9"  (1.753 m)    Wt 54.4 kg    LMP  (LMP Unknown)    SpO2 92%    BMI 17.71 kg/m  Physical Exam Vitals and nursing note reviewed.  Constitutional:      General: She is not in acute distress.    Comments: Cachectic appearing female appears uncomfortable  HENT:     Head: Atraumatic.  Eyes:     Conjunctiva/sclera: Conjunctivae normal.  Cardiovascular:     Rate and Rhythm: Tachycardia present.  Pulmonary:     Effort: Pulmonary effort is normal.     Breath sounds: Rales present. No wheezing or rhonchi.  Abdominal:     Palpations: Abdomen is soft.     Tenderness: There is no abdominal tenderness.  Musculoskeletal:     Cervical back: Neck supple.     Right lower leg: No edema.     Left lower leg: No edema.  Skin:    Findings: No rash.  Neurological:     Mental Status: She is alert and oriented to person, place, and time.  Psychiatric:        Mood and Affect: Mood normal.    ED Results / Procedures / Treatments   Labs (all labs ordered are listed, but only abnormal results are displayed) Labs Reviewed  CBC WITH DIFFERENTIAL/PLATELET - Abnormal; Notable for the following components:      Result Value   WBC 17.6 (*)    Hemoglobin 10.2 (*)    HCT 30.2 (*)    MCV 70.6 (*)    MCH 23.8 (*)    RDW 19.2 (*)    Platelets 443 (*)    Neutro Abs 14.4 (*)    Monocytes Absolute 1.2 (*)    All other components within normal limits  COMPREHENSIVE METABOLIC PANEL - Abnormal; Notable for the following components:   Sodium 129 (*)    Chloride 94 (*)    BUN 23 (*)    Creatinine, Ser 1.10 (*)    Total Protein 6.4 (*)    Albumin 2.8 (*)    All other components within normal limits  RESP PANEL BY RT-PCR (FLU A&B, COVID) ARPGX2  CULTURE, BLOOD (ROUTINE X 2)  CULTURE, BLOOD (ROUTINE X 2)  URINE CULTURE  LACTIC ACID, PLASMA  LACTIC ACID, PLASMA  PROTIME-INR  APTT  URINALYSIS,  ROUTINE W REFLEX MICROSCOPIC    EKG EKG Interpretation  Date/Time:  Friday July 03 2021 18:18:29 EST Ventricular Rate:  120 PR Interval:  124 QRS Duration: 80 QT Interval:  306 QTC Calculation: 432 R Axis:   84 Text Interpretation: Sinus tachycardia Left ventricular hypertrophy with repolarization abnormality Non-specific ST-t changes Confirmed by Lajean Saver [phone]) on 07/03/2021 8:39:02 PM  Radiology DG Chest 2 View  Result Date: 07/03/2021 CLINICAL DATA:  Cough, pneumonia  EXAM: CHEST - 2 VIEW COMPARISON:  Previous studies including the examination of 06/28/2021 FINDINGS: There is interval increase in patchy infiltrates in both lower lung fields. In the lateral view, the infiltrates appear to be anterior suggesting multifocal pneumonia in the right middle lobe and lingula. There is minimal blunting of right lateral CP angle. There is no pneumothorax. Low position of diaphragms suggests COPD. IMPRESSION: There is interval increase in patchy infiltrates in both lower lung fields suggesting multifocal pneumonia. Small right pleural effusion. COPD. Electronically Signed   By: Elmer Picker M.D.   On: 07/03/2021 19:28    Procedures .Critical Care Performed by: Domenic Moras, PA-C Authorized by: Domenic Moras, PA-C   Critical care provider statement:    Critical care time (minutes):  35   Critical care was time spent personally by me on the following activities:  Development of treatment plan with patient or surrogate, discussions with consultants, evaluation of patient's response to treatment, examination of patient, ordering and review of laboratory studies, ordering and review of radiographic studies, ordering and performing treatments and interventions, pulse oximetry, re-evaluation of patient's condition and review of old charts    Medications Ordered in ED Medications  lactated ringers infusion (has no administration in time range)  lactated ringers bolus 1,000 mL (has no  administration in time range)  cefTRIAXone (ROCEPHIN) 2 g in sodium chloride 0.9 % 100 mL IVPB (has no administration in time range)  azithromycin (ZITHROMAX) 500 mg in sodium chloride 0.9 % 250 mL IVPB (has no administration in time range)    ED Course/ Medical Decision Making/ A&P                           Medical Decision Making Amount and/or Complexity of Data Reviewed Labs: ordered. ECG/medicine tests: ordered.  Risk Prescription drug management.   BP (!) 143/118 (BP Location: Right Arm)    Pulse (!) 119    Temp 99.1 F (37.3 C) (Oral)    Resp (!) 28    Ht 5\' 9"  (1.753 m)    Wt 54.4 kg    LMP  (LMP Unknown)    SpO2 92%    BMI 17.71 kg/m   8:46 PM Patient with history of polysubstance abuse, homelessness, asthma, presenting with cold symptoms for the past several weeks.  Was previously seen in the ED but left AMA.  At that time she was noted to have pneumonia.  She is here with progressive worsening of the symptoms.  Chest x-ray obtained today shows an increase in patchy filtrates of both lower lungs field suggesting multifocal pneumonia with small right pleural effusion.  Code sepsis initiated.  Will initiate antibiotic and IV fluid and will consult for admission.  EKG shows sinus tachycardia.  10:13 PM Labs and imaging was independently reviewed and interpreted by me.  Urinalysis obtained shows moderate leukocyte esterase, 21-50 WBC suggestive of a urinary tract infection.  She has normal lactic acid therefore she will not require fluid resuscitation at 30 mm/kg.  Blood culture has been sent.  Elevated white count of 17.6, mild AKI with BUN 23, creatinine 1.1, IV fluid given.  Sodium of 129.  Code sepsis initiated, patient was started on antibiotic which includes Rocephin and Zithromax to cover for both multifocal pneumonia as well as UTI.  10:30 PM Sepsis reassessment done, HR have improved with IVF.  Appreciate consultation from [facility] medicine resident who agrees to see and  admit pt for further care.  This patient presents to the ED for concern of sepsis, this involves an extensive number of treatment options, and is a complaint that carries with it a high risk of complications and morbidity.  The differential diagnosis includes PNA, UTI, skin infection, viral illness, urosepsis  Co morbidities that complicate the patient evaluation polysubstance abuse  Recurrent sepsis  Bipolar  HTN Additional history obtained:  Additional history obtained from no one External records from outside source obtained and reviewed including prior ER visits  Lab Tests:  I Ordered, and personally interpreted labs.  The pertinent results include:  as mentioned above  Imaging Studies ordered:  I ordered imaging studies including CXR  I independently visualized and interpreted imaging which showed multifocal pneumonia I agree with the radiologist interpretation  Cardiac Monitoring:  The patient was maintained on a cardiac monitor.  I personally viewed and interpreted the cardiac monitored which showed an underlying rhythm of: sinus tachycardia  Medicines ordered and prescription drug management:  I ordered medication including rocephin and azithromycin  for multifocal pneumonia Reevaluation of the patient after these medicines showed that the patient improved I have reviewed the patients home medicines and have made adjustments as needed  Test Considered: as above  Critical Interventions: as above  Consultations Obtained:  I requested consultation with the Family Medicine resident,  and discussed lab and imaging findings as well as pertinent plan - they recommend: admission for further management  Problem List / ED Course: multifocal pneumonia  Lower UTI  Sepsis  Reevaluation:  After the interventions noted above, I reevaluated the patient and found that they have :improved  Social Determinants of Health: homelessness  Polysubstance use  Financial  restraint  Multiple comorbidities  Dispostion:  After consideration of the diagnostic results and the patients response to treatment, I feel that the patent would benefit from admission.         Final Clinical Impression(s) / ED Diagnoses Final diagnoses:  Sepsis, due to unspecified organism, unspecified whether acute organ dysfunction present [facility])  Pneumonia of both lower lobes due to infectious organism  Acute lower UTI    Rx / DC Orders ED Discharge Orders     None         Domenic Moras, PA-C 07/03/21 2235    Lajean Saver, MD 07/03/21 2253

## 2021-07-03 NOTE — H&P (Addendum)
[facility] Admission History and Physical Service Pager: [phone]  Patient name: Terri [name] Medical record number: [id number] Date of birth: 08-Feb-1976 Age: 46 y.o. Gender: female  Primary Care Provider: Pcp, No Consultants: None  Code Status: FULL Preferred Emergency Contact:  Contact Information     Name Relation Home Work Mobile   [place] Daughter [phone]         Chief Complaint: SOB, cough   Assessment and Plan: Terri [name] is a 46 y.o. female presenting with SOB. PMH is significant for HTN, H/o DVT, E. Coli bacteremia, polysubstance use disorder, IDA, bipolar disorder/anxiety/depression, h/o drug overdose, vitamin B12 deficiency and gastric bypass.   Sepsis   Multifocal PNA, Right Pleural Effusion   Likely COPD  Pt presented to the ED with onset of SOB, cough and was found to meet sepsis criteria with tachycardia, tachypnea, and leukocytosis to 17.6 with left shift with suspected source of infection (findings on CXR). No evidence of lactic acidosis on admission. Other significant lab findings include: UA with moderate leukocytes, 100 protein, rare bacteria, hyponatremia to 129, BUN 23, Cr 1.1, microcytic anemia with Hgb 10.2. CXR showed patchy infiltrates suggestive of multifocal pneumonia with small right pleural effusion. Concern for PNA clinically and x-ray findings as well as sepsis criteria. Coverage of CAP with CTX and Azithromycin, obtaining MRSA PCR. Consider TB as part of differential given social history (drug use and housing insecurity) and reports of pink-tinged sputum. Blood cx have been obtained as well. Patient likely has COPD given hyperinflation on chest XR and long smoking history, but does not have previous PFTs. Will monitor pleural effusion and consider thoracentesis if patient has new oxygen requirement or if the effusion becomes larger. In the ED, the provider ordered abx (CTX and Azithro), Tylenol 1g once, and an LR 1L  bolus. Will admit to FPTS for hydration, antibiotic treatment, and further evaluation.  -Admit to FPTS, Attending Dr. Owens Shark  -VS per floor protocol  -Azithro and CTX for CAP coverage   -MRSA PCR -Consider quant TB -AM CBC, BMP  -f/u blood cx  -LR 156mL/hr mIVF for 20 hours  -Trial Duonebs  -continuous pulse ox  -cardiac monitoring   UTI  UA with moderate leukocytes, 100 protein, rare bacteria. Pt has dysuria as well as increased frequency.  -Urine cx pending  -Abx as above for coverage   Chest Pain  Patient reports of chest pain that radiates to the back and has a pressure as well as sharp pain. May be related to pneumonia. EKG reassuring with sinus tachycardia and LVH.  -AM trop   Vomiting, Abdominal Pain   Bowel Incontinence with diarrhea   Patient has had CTA of abdomen/pelvis on 06/23/21 that showed no concerning abdominal findings. Abdominal exam today non-acute. Possible that diarrhea and bowel incontinence could be related to drug use and/or withdrawal. Can consider KUB, but she has had recent imaging as previously noted.  -Serial abdominal exams  -fiber supplement daily -Avoid QT prolonging medications   HTN  Patient has a history of HTN and is not taking any home medications. BP range 140-150s/110s-120s. Home medication that is documented in chart review: Lisinopril 10 mg daily, Amlodipine 10 mg once daily, Hydralazine 50 mg twice daily.  -Restart Amlodipine 10 mg and Hydralazine 50 mg 2xdaily  -Monitor VS   Microcytic Anemia  Hgb 10.2 with MCV 70.6. Pt has no signs of active bleeding, never had a colonoscopy. Patient has been prescribed oral iron per chart review  but was not taking it.  -AM iron panel, B12, folate   AKI  Creatinine to 1.1 with baseline of 0.6-0.7 per chart review. Will hydrate and monitor kidney function.  -IVF LR 100 mL/hr for 20 hours -Avoid nephrotoxic agents -Consider renal US  Hyponatremia  Na of 129 (nml glucose). Chronic per chart review.  Urine sodium and urine osmolality ordered for further work up to determine cause.  -Urine Na -Urine osmolality  -AM BMP  -mIVF  H/o DVT  Not on anticoagulation currently, has been on Coumadin in May 2012 for 6 month course at discharge from the hospital (unsure when discontinued). She presented in the ED with tachypnea and tachycardia (2/2 to sepsis) but does not have an oxygen requirement and had CTA chest/abd/pelvis on 06/23/21 that was negative for pulmonary embolism. Tachycardia and tachypnea have since resolved.  -Symptomatic monitoring   GERD Was prescribed Protonix 40 mg outpatient. Has epigastric tenderness on exam.  -Continue Protonix 40 mg daily   Vitamin B12 deficiency   H/o Gastric Bypass  Nutrient deficiency likely secondary to gastric bypass.  -B12 level in AM   Low BMI   Protein Calorie Malnutrition  Patient has BMI of 17.7. Weight in October was 132 lb--->119lbs today.  -Consider nutrition consult -Ensure supplementation BID with meals   Polysubstance Use   H/o drug overdose  History of polysubstance use, no EToH. She reports using crack and heroin 07/02/21 and would like to go to inpatient rehabilitation. Will treat symptomatically for withdrawal symptoms.  -COWs scores   Tobacco Use Smokes 1PPD for 35 years. -Nicotine patch PRN   Bipolar disorder/Anxiety/Depression   H/o Self Injurious Behavior  Likely would benefit from psych outpatient. Pt was prescribed Atarax 25 mg outpatient, but is not takign any medications.  -Can consider Atarax for anxiety  Housing Insecurity  Pt suffers from housing insecurity and would benefit from social work assistance.  -TOC consult   FEN/GI: Regular diet  Prophylaxis: Lovenox   Disposition: Med-Surg   History of Present Illness:  Terri [name] is a 46 y.o. female presenting with worsening breathing, cough, and shortness of breath. PMH is significant for HTN, H/o DVT, E. Coli bacteremia, polysubstance use disorder, IDA, bipolar  disorder/anxiety/depression, h/o drug overdose, vitamin B12 deficiency and gastric bypass.   Patient has been feeling bad for the past month and has been seen on [facility] ED for cough and SOB but not hospitalized in the last month. She reports of sputum that can be green to black to somewhat pink. Denies frank red blood. She has however been having trouble getting sputum up today. She reports of undocumented fever that is subjective. Endorses chills, rhinorrhea. She has not taken anything as far as medication for this in the past month.  She has "sharp, shooting, pressure" in her chest for the past month that radiates to the back.  Chest pain hurts with coughing and taking a deep breath. Her chest pain radiates to her back. She has hx of VTE, not on any blood thinners currently.   She has "lost control for her bowels" in the past month. Last episode was a couple days ago. Last normal bowel movement was "a minute" ago, states she can't remember. Patient has been experiencing intermittent diarrhea. She has had abdominal pain and non-bloody emesis for the past 3 days.   Patient reports urinary urgency, some itching when urine comes out and dysuria.    Patient has been sleeping in her friend's car.  The past two days she has been trying to get into inpatient rehab for drug use.  Last time she used was yesterday, smoked crack and sniffed heroin.  Denies history of IV drug use. She smokes 1 ppd and has since she was 46 yo.  She does not have a PCP and does not take any medications currently.   Review Of Systems: Per HPI with the following additions:   Review of Systems  Constitutional:  Positive for chills and fever.  HENT:  Positive for rhinorrhea.   Eyes:  Negative for visual disturbance.  Respiratory:  Positive for cough and shortness of breath.   Cardiovascular:  Positive for chest pain.  Gastrointestinal:  Positive for abdominal pain and vomiting.  Genitourinary:  Positive  for dysuria, frequency and urgency.  Musculoskeletal:  Positive for back pain.  Skin:  Negative for wound.  Neurological:  Positive for headaches.  Psychiatric/Behavioral:         Substance use  All other systems reviewed and are negative.   Patient Active Problem List   Diagnosis Date Noted   Cellulitis of right lower extremity 05/28/2021   Vitamin B12 deficiency 05/24/2021   Cellulitis 05/22/2021   COVID-19 virus infection 05/22/2021   Overdose 04/13/2021   Hypoxemia 04/13/2021   Hypoglycemia 04/13/2021   Asthma exacerbation 04/01/2021   Anemia 04/01/2021   Iron deficiency anemia 03/18/2021   Hyponatremia 03/18/2021   Marginal ulcer 03/15/2021   Microcytic anemia 02/26/2021   Mild protein malnutrition ([place]) 02/26/2021   Polysubstance abuse ([place]) 01/16/2021   Abdominal pain 01/14/2021   Ulcer at site of surgical anastomosis following bypass of stomach 01/13/2021   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 coli) ([place]) 02/11/2019   AKI (acute kidney injury) ([place])    Peptic ulcer disease 02/09/2019   S/P gastric bypass 02/09/2019   Persistent fever 02/09/2019   COVID-19 virus not detected    Chest pain    Bacteremia due to Escherichia coli    Esophagitis    Hypokalemia    Hypomagnesemia    Sepsis ([place]) 02/02/2019   Leucocytosis 02/02/2019   Thrombocytosis 02/02/2019   UTI (urinary tract infection) 02/02/2019   Apnea 09/13/2017   Essential hypertension    Cellulitis and abscess of neck 08/12/2017   Cellulitis of submandibular region 08/10/2017   Odontogenic infection of jaw 08/10/2017   Cholecystitis 02/14/2017   Opioid abuse with opioid-induced mood disorder ([place]) 01/25/2017   Anxiety 02/11/2014   Depression  07/12/2012    Past Medical History: Past Medical History:  Diagnosis Date   Arthritis    Back and legs   Bipolar disorder (HCC)    Chronic back pain    Depression    DVT (deep venous thrombosis) (HCC)    early 20's leg   GIB (gastrointestinal bleeding) 09/13/2017   History of blood transfusion    Hypertension    Migraine headache    Neuropathy    Pneumonia    Positive PPD    x 2 last time 09/2017   Sciatica    Stress incontinence     Past Surgical History: Past Surgical History:  Procedure Laterality Date   ALVEOLOPLASTY Bilateral 01/31/2018   Procedure: ALVEOLOPLASTY;  Surgeon: Diona Browner, DDS;  Location: [place];  Service: Oral  Surgery;  Laterality: Bilateral;   ANKLE SURGERY Left 1992   with hardware   BACK SURGERY     BIOPSY  02/08/2019   Procedure: BIOPSY;  Surgeon: Rush Landmark Telford Nab., MD;  Location: [facility];  Service: Gastroenterology;;   CESAREAN SECTION     CHOLECYSTECTOMY N/A 02/15/2017   Procedure: LAPAROSCOPIC CHOLECYSTECTOMY;  Surgeon: Clovis Riley, MD;  Location: [facility];  Service: General;  Laterality: N/A;   ENTEROSCOPY N/A 02/08/2019   Procedure: ENTEROSCOPY;  Surgeon: Rush Landmark Telford Nab., MD;  Location: [facility];  Service: Gastroenterology;  Laterality: N/A;   ESOPHAGOGASTRODUODENOSCOPY N/A 01/17/2021   Procedure: ESOPHAGOGASTRODUODENOSCOPY (EGD);  Surgeon: Juanita Craver, MD;  Location: [facility];  Service: Endoscopy;  Laterality: N/A;   ESOPHAGOGASTRODUODENOSCOPY (EGD) WITH PROPOFOL N/A 09/15/2017   Procedure: ESOPHAGOGASTRODUODENOSCOPY (EGD) WITH PROPOFOL;  Surgeon: Clarene Essex, MD;  [facility];  Service: Endoscopy;  Laterality: N/A;   ESOPHAGOGASTRODUODENOSCOPY (EGD) WITH PROPOFOL N/A 02/08/2019   Procedure: ESOPHAGOGASTRODUODENOSCOPY (EGD) WITH PROPOFOL;  Surgeon: Rush Landmark Telford Nab., MD;  Location: [facility];  Service: Gastroenterology;  Laterality: N/A;   GASTRIC BYPASS     INCISION AND DRAINAGE OF PERITONSILLAR  ABCESS Left 08/13/2017   Procedure: INCISION AND DRAINAGE OF LEFT NECK ABSCESS;  Surgeon: Melida Quitter, MD;  Location: [facility];  Service: ENT;  Laterality: Left;   LAPAROSCOPIC LYSIS OF ADHESIONS  02/15/2017   Procedure: LAPAROSCOPIC LYSIS OF ADHESIONS;  Surgeon: Clovis Riley, MD;  Location: [facility];  Service: General;;   LUMBAR DISC SURGERY     LUMBAR FUSION     SAVORY DILATION N/A 02/08/2019   Procedure: SAVORY DILATION;  Surgeon: Irving Copas., MD;  Location: [facility];  Service: Gastroenterology;  Laterality: N/A;   TOOTH EXTRACTION Bilateral 01/31/2018   Procedure: DENTAL RESTORATION/EXTRACTIONS;  Surgeon: Diona Browner, DDS;  Location: [place];  Service: Oral Surgery;  Laterality: Bilateral;   TUBAL LIGATION     UPPER GI ENDOSCOPY N/A 02/15/2017   Procedure: UPPER GI ENDOSCOPY;  Surgeon: Clovis Riley, MD;  Location: [facility];  Service: General;  Laterality: N/A;    Social History: Social History   Tobacco Use   Smoking status: Every Day    Packs/day: 1.00    Years: 29.00    Pack years: 29.00    Types: Cigarettes   Smokeless tobacco: Never  Vaping Use   Vaping Use: Never used  Substance Use Topics   Alcohol use: No   Drug use: Yes    Types: Cocaine    Comment: Patient states she smokes cocaine   Additional social history: Patient has housing insecurity and polysubstance use disorder (cocaine, heroin, tobacco).   Please also refer to relevant sections of EMR.  Family History: Family History  Problem Relation Age of Onset   Diabetes Mother    Hypertension Mother    Diabetes Father    Hypertension Father     Allergies and Medications: Allergies  Allergen Reactions   Nsaids Other (See Comments)    Stomach ulcer    Ketorolac Tromethamine Itching and Nausea And Vomiting   No current facility-administered medications on file prior to encounter.   Current Outpatient Medications on File Prior to Encounter  Medication Sig Dispense Refill    acetaminophen (TYLENOL) 500 MG tablet Take 2 tablets (1,000 mg total) by mouth every 8 (eight) hours as needed for mild pain. (Patient not taking: Reported on 06/28/2021) 30 tablet 0   amLODipine (NORVASC) 10 MG tablet Take 1 tablet (10 mg total) by mouth  daily. (Patient not taking: Reported on 06/25/2021) 30 tablet 0   Ferrous Fumarate (IRON) 18 MG TBCR Take 1 tablet (18 mg total) by mouth 2 (two) times daily after a meal. (Patient not taking: Reported on 06/25/2021) 60 tablet 0   hydrALAZINE (APRESOLINE) 50 MG tablet Take 1 tablet (50 mg total) by mouth 2 (two) times daily. (Patient not taking: Reported on 06/25/2021) 60 tablet 0   hydrOXYzine (ATARAX/VISTARIL) 25 MG tablet Take 1 tablet (25 mg total) by mouth every 8 (eight) hours as needed for anxiety. (Patient not taking: Reported on 06/25/2021) 20 tablet 0   lisinopril (ZESTRIL) 10 MG tablet Take 1 tablet (10 mg total) by mouth daily. (Patient not taking: Reported on 06/25/2021) 30 tablet 0   methocarbamol (ROBAXIN) 750 MG tablet Take 1 tablet (750 mg total) by mouth every 8 (eight) hours as needed for muscle spasms. (Patient not taking: Reported on 06/25/2021) 30 tablet 0   nicotine (NICODERM CQ - DOSED IN MG/24 HOURS) 21 mg/24hr patch Place 1 patch (21 mg total) onto the skin daily. (Patient not taking: Reported on 06/25/2021) 28 patch 0   ondansetron (ZOFRAN) 4 MG tablet Take 1 tablet (4 mg total) by mouth every 8 (eight) hours as needed for up to 20 doses for nausea or vomiting. (Patient not taking: Reported on 06/28/2021) 20 tablet 0   ondansetron (ZOFRAN-ODT) 4 MG disintegrating tablet Take 1 tablet (4 mg total) by mouth every 8 (eight) hours as needed for nausea or vomiting. (Patient not taking: Reported on 06/25/2021) 20 tablet 0   oxyCODONE (OXY IR/ROXICODONE) 5 MG immediate release tablet Take 1 tablet (5 mg total) by mouth every 6 (six) hours as needed for severe pain. (Patient not taking: Reported on 06/25/2021) 12 tablet 0   pantoprazole (PROTONIX) 40 MG  tablet Take 1 tablet (40 mg total) by mouth daily. (Patient not taking: Reported on 06/25/2021) 30 tablet 3   vitamin B-12 (CYANOCOBALAMIN) 1000 MCG tablet Take 1 tablet (1,000 mcg total) by mouth daily. (Patient not taking: Reported on 06/25/2021) 30 tablet 0    Objective: BP (!) 153/122 (BP Location: Left Arm)    Pulse 86    Temp 99.1 F (37.3 C) (Oral)    Resp 14    Ht 5\' 9"  (1.753 m)    Wt 54.4 kg    LMP  (LMP Unknown)    SpO2 97%    BMI 17.71 kg/m  Exam: General: NAD, frail and cachectic, non-toxic, sitting up in bed, drowsy Eyes: Tracking appropriately  ENTM: nml nares  Neck: FROM  Cardiovascular: RRR, no m/g/r  Respiratory: CTAB with decreased air movement, no wheezing/crackles/stridor/rhonchi  Gastrointestinal: tender of epigastric region, nondistended, soft  MSK: FROM, no C, T or L midline spinous process tenderness  Derm: Small sores scattered on skin, one on back and one on left arm without excoriations   Neuro: Able to answer questions appropriately but would seemingly fall asleep intermittently during conversation, would awaken appropriately to stimuli and voice, when awake A&Ox3 Psych: Appropriate mood   Labs and Imaging: CBC BMET  Recent Labs  Lab 07/03/21 1852  WBC 17.6*  HGB 10.2*  HCT 30.2*  PLT 443*   Recent Labs  Lab 07/03/21 1852  NA 129*  K 3.7  CL 94*  CO2 25  BUN 23*  CREATININE 1.10*  GLUCOSE 70  CALCIUM 8.9     EKG: My own interpretation sinus tachycardia with LVH, similar to previous on 2/8   DG Chest 2 View  Result Date: 07/03/2021 CLINICAL DATA:  Cough, pneumonia EXAM: CHEST - 2 VIEW COMPARISON:  Previous studies including the examination of 06/28/2021 FINDINGS: There is interval increase in patchy infiltrates in both lower lung fields. In the lateral view, the infiltrates appear to be anterior suggesting multifocal pneumonia in the right middle lobe and lingula. There is minimal blunting of right lateral CP angle. There is no pneumothorax. Low  position of diaphragms suggests COPD. IMPRESSION: There is interval increase in patchy infiltrates in both lower lung fields suggesting multifocal pneumonia. Small right pleural effusion. COPD. Electronically Signed   By: Elmer Picker M.D.   On: 07/03/2021 19:28     Erskine Emery, MD 07/03/2021, 10:24 PM PGY-1, [place] Intern pager: [phone], text pages welcome   FPTS Upper-Level Resident Addendum   I have independently interviewed and examined the patient. I have discussed the above with the original author and agree with their documentation. My edits for correction/addition/clarification are in within the document. Please see also any attending notes.   Lyndee Hensen, DO PGY-3, [facility] 07/04/2021 2:53 AM  FPTS Service pager: [phone] (text pages welcome through [facility])

## 2021-07-04 ENCOUNTER — Encounter (HOSPITAL_COMMUNITY): Payer: Self-pay | Admitting: Student

## 2021-07-04 DIAGNOSIS — J189 Pneumonia, unspecified organism: Secondary | ICD-10-CM | POA: Insufficient documentation

## 2021-07-04 DIAGNOSIS — J181 Lobar pneumonia, unspecified organism: Secondary | ICD-10-CM | POA: Insufficient documentation

## 2021-07-04 LAB — SODIUM, URINE, RANDOM: Sodium, Ur: <10 mmol/L

## 2021-07-04 LAB — OSMOLALITY, URINE: Osmolality, Ur: 551 mOsm/kg (ref 300–900)

## 2021-07-04 MED ORDER — HYDRALAZINE HCL 50 MG PO TABS
50.0000 mg | ORAL_TABLET | Freq: Two times a day (BID) | ORAL | Status: DC
  Administered 2021-07-04 (×2): 50 mg via ORAL
  Filled 2021-07-04: qty 1
  Filled 2021-07-04: qty 2

## 2021-07-04 MED ORDER — NICOTINE 7 MG/24HR TD PT24
7.0000 mg | MEDICATED_PATCH | Freq: Every day | TRANSDERMAL | Status: DC
  Administered 2021-07-04: 7 mg via TRANSDERMAL
  Filled 2021-07-04: qty 1

## 2021-07-04 MED ORDER — NUTRISOURCE FIBER PO PACK
1.0000 | PACK | Freq: Every day | ORAL | Status: DC
  Filled 2021-07-04: qty 1

## 2021-07-04 MED ORDER — AZITHROMYCIN 500 MG PO TABS: 500.0000 mg | ORAL_TABLET | Freq: Every day | ORAL | 0 refills | Status: AC

## 2021-07-04 MED ORDER — IPRATROPIUM-ALBUTEROL 0.5-2.5 (3) MG/3ML IN SOLN
3.0000 mL | Freq: Once | RESPIRATORY_TRACT | Status: DC
  Filled 2021-07-04: qty 3

## 2021-07-04 MED ORDER — ACETAMINOPHEN 500 MG PO TABS: 1000.0000 mg | ORAL_TABLET | Freq: Four times a day (QID) | ORAL | Status: DC | PRN

## 2021-07-04 MED ORDER — PANTOPRAZOLE SODIUM 40 MG PO TBEC: 40.0000 mg | DELAYED_RELEASE_TABLET | Freq: Every day | ORAL | Status: DC

## 2021-07-04 MED ORDER — PANTOPRAZOLE SODIUM 40 MG IV SOLR
40.0000 mg | Freq: Once | INTRAVENOUS | Status: DC
  Filled 2021-07-04: qty 10

## 2021-07-04 MED ORDER — CEFDINIR 300 MG PO CAPS
300.0000 mg | ORAL_CAPSULE | Freq: Two times a day (BID) | ORAL | 0 refills | Status: AC
Start: 2021-07-04 — End: 2021-07-11

## 2021-07-04 MED ORDER — DIPHENHYDRAMINE HCL 50 MG/ML IJ SOLN: 25.0000 mg | Freq: Once | INTRAMUSCULAR | Status: DC

## 2021-07-04 MED ORDER — AMLODIPINE BESYLATE 5 MG PO TABS
10.0000 mg | ORAL_TABLET | Freq: Every day | ORAL | Status: DC
  Administered 2021-07-04: 10 mg via ORAL
  Filled 2021-07-04: qty 2

## 2021-07-04 MED ORDER — IPRATROPIUM-ALBUTEROL 0.5-2.5 (3) MG/3ML IN SOLN: 3.0000 mL | RESPIRATORY_TRACT | Status: DC | PRN

## 2021-07-04 NOTE — ED Notes (Signed)
Attempted report x 3.  

## 2021-07-04 NOTE — Discharge Summary (Addendum)
[facility] Discharge Summary  Patient name: Terri [name] record number: [id number] Date of birth: January 04, 1976 Age: 46 y.o. Gender: female Date of Admission: 07/03/2021  Date of Discharge: 07/04/2021 AMA Admitting Physician: Erskine Emery, MD  Primary Care Provider: Pcp, [name] Consultants: None  Indication for Hospitalization: Shortness of breath  Discharge Diagnoses/Problem List:  Principal Problem:   Sepsis ([place]) Active Problems:   Acute lower UTI    Disposition: Patient left AMA  Discharge Condition: Fair  Discharge Exam: Patient refused physical exam  Brief Hospital Course:  Terri [name] Hospital Course  Sepsis   Multifocal PNA with R Pleural Effusion [name] presented to the [facility] ED on 2/10 with shortness of breath and cough, found to meet sepsis criteria with tachycardia, tachypnea, leukocytosis to 17.6 with left shift.  Chest x-ray showed patchy infiltrates suggestive of multifocal pneumonia with right pleural effusion.  Patient was started on ceftriaxone and azithromycin to cover for community-acquired pneumonia.  We were also somewhat concerned for tuberculosis given patient has a history of drug use and housing insecurity and reported blood-tinged sputum and also reported recent weight loss.  Unfortunately, the patient refused all medications and labs. On 2/11 the patient demanded to leave the hospital.  We explained to her the risks of leaving [facility] up to and including readmission and death.  She voiced an understanding of these risks and demonstrated capacity to make her own medical decisions.  She signed herself out of the hospital [place].  Polysubstance Abuse Patient reported a history of crack cocaine and heroin use as recently as the day before admission.  We monitored her for signs of opioid withdrawal throughout her hospitalization.  We also offered her Suboxone therapy which she declined.  Her last  COWS score prior to leaving the hospital was 8.   Items for Follow-up  1) patient declined Suboxone therapy, recommend offering assistance with her substance use disorder 2) patient would benefit from outpatient pulmonary function testing to confirm diagnosis of COPD 3) Recommend age-appropriate cancer screening 4) recommend offering assistance for tobacco use disorder   Significant Procedures: None  Significant Labs and Imaging:  Recent Labs  Lab 06/28/21 0830 07/03/21 1852  WBC 19.6* 17.6*  HGB 11.9* 10.2*  HCT 35.3* 30.2*  PLT 310 443*   Recent Labs  Lab 06/28/21 0830 07/03/21 1852  NA 130* 129*  K 3.2* 3.7  CL 93* 94*  CO2 27 25  GLUCOSE 80 70  BUN 26* 23*  CREATININE 0.99 1.10*  CALCIUM 9.0 8.9  ALKPHOS 85 91  AST 12* 15  ALT 10 10  ALBUMIN 3.5 2.8*    DG Chest 2 View CLINICAL DATA:  Cough, pneumonia  EXAM: CHEST - 2 VIEW  COMPARISON:  Previous studies including the examination of 06/28/2021  FINDINGS: There is interval increase in patchy infiltrates in both lower lung fields. In the lateral view, the infiltrates appear to be anterior suggesting multifocal pneumonia in the right middle lobe and lingula. There is minimal blunting of right lateral CP angle. There is [name] pneumothorax. Low position of diaphragms suggests COPD.  IMPRESSION: There is interval increase in patchy infiltrates in both lower lung fields suggesting multifocal pneumonia. Small right pleural effusion. COPD.  Electronically Signed   By: Elmer Picker M.D.   On: 07/03/2021 19:28    Results/Tests Pending at Time of Discharge: None   Discharge Medications:  Allergies as of 07/04/2021       Reactions  Nsaids Other (See Comments)   Stomach ulcer   Ketorolac Tromethamine Itching, Nausea And Vomiting        Medication List     STOP taking these medications    methocarbamol 750 MG tablet Commonly known as: ROBAXIN   ondansetron 4 MG tablet Commonly known as:  Zofran   oxyCODONE 5 MG immediate release tablet Commonly known as: Oxy IR/ROXICODONE       TAKE these medications    acetaminophen 500 MG tablet Commonly known as: TYLENOL Take 2 tablets (1,000 mg total) by mouth every 8 (eight) hours as needed for mild pain.   amLODipine 10 MG tablet Commonly known as: NORVASC Take 1 tablet (10 mg total) by mouth daily.   azithromycin 500 MG tablet Commonly known as: Zithromax Take 1 tablet (500 mg total) by mouth daily for 5 days. Take 1 tablet daily for 5 days.   cefdinir 300 MG capsule Commonly known as: OMNICEF Take 1 capsule (300 mg total) by mouth 2 (two) times daily for 7 days.   hydrALAZINE 50 MG tablet Commonly known as: APRESOLINE Take 1 tablet (50 mg total) by mouth 2 (two) times daily.   hydrOXYzine 25 MG tablet Commonly known as: ATARAX Take 1 tablet (25 mg total) by mouth every 8 (eight) hours as needed for anxiety.   Iron 18 MG Tbcr Take 1 tablet (18 mg total) by mouth 2 (two) times daily after a meal.   lisinopril 10 MG tablet Commonly known as: ZESTRIL Take 1 tablet (10 mg total) by mouth daily.   nicotine 21 mg/24hr patch Commonly known as: NICODERM CQ - dosed in mg/24 hours Place 1 patch (21 mg total) onto the skin daily.   ondansetron 4 MG disintegrating tablet Commonly known as: ZOFRAN-ODT Take 1 tablet (4 mg total) by mouth every 8 (eight) hours as needed for nausea or vomiting.   pantoprazole 40 MG tablet Commonly known as: PROTONIX Take 1 tablet (40 mg total) by mouth daily.   vitamin B-12 1000 MCG tablet Commonly known as: CYANOCOBALAMIN Take 1 tablet (1,000 mcg total) by mouth daily.        Discharge Instructions: Please refer to Patient Instructions section of EMR for full details.  Patient was counseled important signs and symptoms that should prompt return to medical care, changes in medications, dietary instructions, activity restrictions, and follow up appointments.   Follow-Up  Appointments:  Follow-up [facility] Follow up.   Why: call to establish a primary care doctor. They will work with you on finaces, provide support, finacial counselling and have a pharmacy Contact information: [place] [address] [phone]                Eppie Gibson, MD 07/04/2021, 5:57 PM PGY-1, [place]

## 2021-07-04 NOTE — Progress Notes (Signed)
FPTS Interim Progress Note  Paged regarding patient wanted to leave AMA. Went to bedside with Dr. Joelyn Oms. Patient is firm on wanting to leave. I conveyed to her that we are still in the process of workup and leaving the hospital at this time would be leaving against medical advice. She voices understanding of the risk of leaving prior to being medically clear, she says that she will just come back if anything is wrong. I told her this would further delay her care which she is aware of and still is wanting to go home. She demonstrates full capacity to make this decision and is able to weigh the risks vs benefits. She will leave AMA after further discussion with patient. Sent home on oral antibiotic regimen.   Donney Dice, DO 07/04/2021, 4:01 PM PGY-2, [place] Medicine Service pager [phone]

## 2021-07-04 NOTE — ED Notes (Signed)
Admitting MD at bedside.

## 2021-07-04 NOTE — ED Notes (Signed)
Pt rushed out wo her belongings. Belonging bag w pt's sticker attached left at yellow nurses station.

## 2021-07-04 NOTE — ED Notes (Signed)
Pt called this RN to the room stated she wants to leave now, and her ride is waiting on her outside. Also stated "y'all be doing too much.' This RN paged the admitting doc who stated will come down to see the pt right now.

## 2021-07-04 NOTE — TOC Initial Note (Signed)
Transition of Care [facility]) - Initial/Assessment Note    Patient Details  Name: Terri [name] MRN: [id number] Date of Birth: 12/24/1975  Transition of Care [facility]) CM/SW Contact:    Verdell Carmine, RN Phone Number: 07/04/2021, 3:08 PM  Clinical Narrative:                 46 year old seen in the ED for cough SHOB, bilateral infiltrates. She is currently homeless listing [place] as her primary home. Needs PCP and home less resources. Has a daughter listed as NOK. Patient currently refusing labs and assessments.  TOC referral placed, TOC will follow for needs, recommendations, and transitions..   Expected Discharge Plan: Homeless Shelter Barriers to Discharge: Homeless with medical needs   Patient Goals and CMS Choice        Expected Discharge Plan and Services Expected Discharge Plan: Homeless Shelter In-house Referral: Clinical Social Work Discharge Planning Services: CM Consult   Living arrangements for the past 2 months: Homeless                                      Prior Living Arrangements/Services Living arrangements for the past 2 months: Homeless   Patient language and need for interpreter reviewed:: Yes        Need for Family Participation in Patient Care: Yes (Comment)     Criminal Activity/Legal Involvement Pertinent to Current Situation/Hospitalization: No - Comment as needed  Activities of Daily Living      Permission Sought/Granted                  Emotional Assessment       Orientation: : Oriented to Self, Oriented to Place, Oriented to  Time, Oriented to Situation Alcohol / Substance Use: Illicit Drugs Psych Involvement: Yes (comment)  Admission diagnosis:  Sepsis ([place]) [A41.9] Patient Active Problem List   Diagnosis Date Noted   Pneumonia of both lower lobes due to infectious organism    Cellulitis of right lower extremity 05/28/2021   Vitamin B12 deficiency 05/24/2021   Cellulitis 05/22/2021   COVID-19 virus infection 05/22/2021    Overdose 04/13/2021   Hypoxemia 04/13/2021   Hypoglycemia 04/13/2021   Asthma exacerbation 04/01/2021   Anemia 04/01/2021   Iron deficiency anemia 03/18/2021   Hyponatremia 03/18/2021   Marginal ulcer 03/15/2021   Microcytic anemia 02/26/2021   Mild protein malnutrition ([place]) 02/26/2021   Polysubstance abuse ([place]) 01/16/2021   Abdominal pain 01/14/2021   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/09/2019   Persistent fever 02/09/2019   COVID-19 virus not detected    Chest pain    Bacteremia due to Escherichia coli    Esophagitis    Hypokalemia    Hypomagnesemia    Sepsis ([place]) 02/02/2019   Leucocytosis 02/02/2019   Thrombocytosis 02/02/2019   Acute lower UTI 02/02/2019   Apnea 09/13/2017   Essential hypertension    Cellulitis and abscess of neck 08/12/2017   Cellulitis of submandibular region  08/10/2017   Odontogenic infection of jaw 08/10/2017   Cholecystitis 02/14/2017   Opioid abuse with opioid-induced mood disorder ([place]) 01/25/2017   Anxiety 02/11/2014   Depression 07/12/2012   PCP:  Merryl Hacker, No Pharmacy:   [facility] [place] [address] Phone: [phone] Fax: [phone]     Social Determinants of Health (SDOH) Interventions    Readmission Risk Interventions No flowsheet data found.

## 2021-07-04 NOTE — Progress Notes (Signed)
Family Medicine Teaching Service Daily Progress Note Intern Pager: [phone]  Patient name: Terri [name] Medical record number: [id number] Date of birth: 05-13-1976 Age: 46 y.o. Gender: female  Primary Care Provider: Pcp, No Consultants: None Code Status: Full  Pt Overview and Major Events to Date:  Admitted: 2/10  Assessment and Plan: Terri [name] is a 46 y.o. female presenting with SOB. PMH is significant for HTN, H/o DVT, E. Coli bacteremia, polysubstance use disorder, IDA, bipolar disorder/anxiety/depression, h/o drug overdose, vitamin B12 deficiency and gastric bypass.    Sepsis   Multifocal PNA, Right Pleural Effusion  Admitted for ongoing and then worsening dyspnea with cough. Patient met sepsis criteria upon arrival to the ED. CXR notable for interval increase in patchy infiltrates in both lower lung fields suggesting multifocal pneumonia with small right pleural effusion. Leukocytosis of 17.6, blood and urine cultures obtained. This morning patient denies dyspnea, she just wants to be able to get rest. Respiratory exam limited by patient cooperation therefore unable to evaluate for possible focal findings indicative of consolidation but suspicion for PNA remains given imaging on admission. Consideration also given to possible TB, therefore will obtain sputum culture. COPD possibly further exacerbating symptoms, given duonebs which seemed to improve some symptoms.  -continue azithromycin and ceftriaxone -duonebs q4h prn  -f/u am labs -f/u blood cx -f/u urine cx -f/u sputum cx -f/u MRSA PCR -monitor fever curve -vitals per routine   UTI Initially endorsing dysuria and increased urinary frequency on admission. UA notable for moderate leukocytes, 100 protein and rare bacteria. -continue azithromycin and ceftriaxone for PNA coverage  -f/u urine cx    Chest Pain  Still endorsing chest pain but unable to complete exam per patient. Likely secondary to MSK changes related to current  illness. Patient continues to refuse labs, hopeful she will agree to this after getting a bed on the floor.  -f/u trops -EKG   Hypertension  Multiple episodes of hypertension. Home medications include lisinopril, hydralazine and amlodipine. Hydralazine 50 mg twice daily.  -continue home amlodipine -continue home hydralazine -continue to hold lisinopril given AKI, may restart after AKI resolved  -monitor BP   Microcytic Anemia  On admission, Hgb 10.2 with MCV 70.6. No signs of active bleeding  -f/u iron panel  -f/u B12 and folate  -outpatient colonoscopy    AKI  Cr 1.1 on admission, baseline appears to be <1. Possibly secondary to prerenal etiology.  -IVF LR 100 mL/hr -encourage oral fluids -f/u BMP -hold lisinopril    Hyponatremia  Ongoing and chronic. Na of 129 with normal urine Na and urine osmolality. -f/u BMP   GERD -Continue Protonix 40 mg daily    Vitamin B12 deficiency   H/o Gastric Bypass  Nutrient deficiency likely secondary to gastric bypass.  -B12 level in AM    Low BMI   Protein Calorie Malnutrition  BM 17.7.  -RD consulted  -Ensure supplementation BID with meals    Polysubstance Use   H/o drug overdose  -COWs scores, most recently 8.  -TOC consult    Tobacco Use -nicotine patch    Bipolar disorder/Anxiety/Depression   H/o Self Injurious Behavior  No home medications.  -follow up outpatient   Housing Insecurity  -TOC consult placed    FEN/GI: regular diet  PPx: lovenox  Dispo:Home pending clinical improvement . Barriers include pending clinical improvement as we await cultures to narrow antibiotics.   Subjective:  Patient endorsing chest pain but denies any other symptoms. She does not want to be  disturbed as she is tired. Discussed importance of ongoing workup as to not delay her care but she declines getting any further blood work until she gets a bed upstairs.   Objective: Temp:  [99.1 F (37.3 C)] 99.1 F (37.3 C) (02/10 1830) Pulse  Rate:  [74-119] 86 (02/11 0745) Resp:  [11-28] 15 (02/11 0745) BP: (130-181)/(90-122) 130/90 (02/11 0745) SpO2:  [89 %-98 %] 89 % (02/11 0745) Weight:  [54.4 kg] 54.4 kg (02/10 1829) Physical Exam: General: Patient sleeping comfortably, in no acute distress. Respiratory: breathing comfortably on room air without signs of respiratory distress Psych: mildly agitated   Exam limited by patient cooperation.   Laboratory: Recent Labs  Lab 06/28/21 0830 07/03/21 1852  WBC 19.6* 17.6*  HGB 11.9* 10.2*  HCT 35.3* 30.2*  PLT 310 443*   Recent Labs  Lab 06/28/21 0830 07/03/21 1852  NA 130* 129*  K 3.2* 3.7  CL 93* 94*  CO2 27 25  BUN 26* 23*  CREATININE 0.99 1.10*  CALCIUM 9.0 8.9  PROT 7.2 6.4*  BILITOT 1.0 0.6  ALKPHOS 85 91  ALT 10 10  AST 12* 15  GLUCOSE 80 70      Imaging/Diagnostic Tests: DG Chest 2 View  Result Date: 07/03/2021 CLINICAL DATA:  Cough, pneumonia EXAM: CHEST - 2 VIEW COMPARISON:  Previous studies including the examination of 06/28/2021 FINDINGS: There is interval increase in patchy infiltrates in both lower lung fields. In the lateral view, the infiltrates appear to be anterior suggesting multifocal pneumonia in the right middle lobe and lingula. There is minimal blunting of right lateral CP angle. There is no pneumothorax. Low position of diaphragms suggests COPD. IMPRESSION: There is interval increase in patchy infiltrates in both lower lung fields suggesting multifocal pneumonia. Small right pleural effusion. COPD. Electronically Signed   By: Elmer Picker M.D.   On: 07/03/2021 19:28     Donney Dice, DO 07/04/2021, 8:52 AM PGY-2, [place] Intern pager: [phone], text pages welcome

## 2021-07-04 NOTE — ED Notes (Signed)
This RN attempting to get morning lab work and give various medications. Patient took blood pressure pills but then became very aggitated and began yelling at this RN shouting "When will I get to my room! I am not doing anything else until I get to my room! I want to go to my room!" This RN explained to patient that the upstairs was full at this time and patient likely will not get a bed until after breakfast. This RN attempted to explain importance of protonix and nebulizer treatment but patient continued to interrupt this RN shouting "I don't care, I don't want it".  This RN also explained the importance of morning lab work. Patient continued to become upset and refused all lab work including MRSA swab. Admitting team made aware.

## 2021-07-04 NOTE — ED Notes (Signed)
Pt walked out wo taking the AVS paper. Admitting doc made her aware there is a prescription for her to pick up.

## 2021-07-04 NOTE — Hospital Course (Signed)
Brief Hospital Course  Sepsis   Multifocal PNA with R Pleural Effusion [name] presented to the [facility] ED on 2/10 with shortness of breath and cough, found to meet sepsis criteria with tachycardia, tachypnea, leukocytosis to 17.6 with left shift.  Chest x-ray showed patchy infiltrates suggestive of multifocal pneumonia with right pleural effusion.  Patient was started on ceftriaxone and azithromycin to cover for community-acquired pneumonia.  We were also somewhat concerned for tuberculosis given patient has a history of drug use and housing insecurity and reported blood-tinged sputum and also reported recent weight loss.  Unfortunately, the patient refused all medications and labs. On 2/11 the patient demanded to leave the hospital.  We explained to her the risks of leaving [facility] up to and including readmission and death.  She voiced an understanding of these risks and demonstrated capacity to make her own medical decisions.  She signed herself out of the hospital [place].  Polysubstance Abuse Patient reported a history of crack cocaine and heroin use as recently as the day before admission.  We monitored her for signs of opioid withdrawal throughout her hospitalization.  We also offered her Suboxone therapy which she declined.  Her last COWS score prior to leaving the hospital was 8.   Items for Follow-up  1) patient declined Suboxone therapy, recommend offering assistance with her substance use disorder 2) patient would benefit from outpatient pulmonary function testing to confirm diagnosis of COPD 3) Recommend age-appropriate cancer screening 4) recommend offering assistance for tobacco use disorder

## 2021-07-04 NOTE — ED Notes (Signed)
Phlebotomy to bedside to obtain blood work, Pt is continuing to refuse.RN aware

## 2021-07-04 NOTE — ED Notes (Signed)
Patient continuing to refuse further care until she is "in a room upstairs"

## 2021-07-04 NOTE — ED Notes (Signed)
Pt signed the AMA form , verbalized understanding the risks and adverse outcomes of leaving AMA.

## 2021-07-05 LAB — URINE CULTURE

## 2021-07-07 ENCOUNTER — Ambulatory Visit (HOSPITAL_COMMUNITY)
Admission: EM | Admit: 2021-07-07 | Discharge: 2021-07-07 | Disposition: A | Discharge status: Other Acute Inpatient Hospital | Payer: Medicaid Other | Source: Home / Self Care

## 2021-07-07 ENCOUNTER — Emergency Department (HOSPITAL_COMMUNITY): Payer: Medicaid Other

## 2021-07-07 ENCOUNTER — Encounter (HOSPITAL_COMMUNITY): Payer: Self-pay | Admitting: Emergency Medicine

## 2021-07-07 ENCOUNTER — Observation Stay (HOSPITAL_COMMUNITY)
Admission: EM | Admit: 2021-07-07 | Discharge: 2021-07-08 | DRG: 193 | Discharge status: Against Medical Advice | Payer: Medicaid Other | Attending: Family Medicine | Admitting: Family Medicine

## 2021-07-07 ENCOUNTER — Other Ambulatory Visit: Payer: Self-pay

## 2021-07-07 DIAGNOSIS — G43909 Migraine, unspecified, not intractable, without status migrainosus: Secondary | ICD-10-CM | POA: Diagnosis present

## 2021-07-07 DIAGNOSIS — R64 Cachexia: Secondary | ICD-10-CM | POA: Diagnosis not present

## 2021-07-07 DIAGNOSIS — F1410 Cocaine abuse, uncomplicated: Secondary | ICD-10-CM | POA: Insufficient documentation

## 2021-07-07 DIAGNOSIS — T402X5A Adverse effect of other opioids, initial encounter: Secondary | ICD-10-CM | POA: Diagnosis present

## 2021-07-07 DIAGNOSIS — E876 Hypokalemia: Secondary | ICD-10-CM | POA: Diagnosis not present

## 2021-07-07 DIAGNOSIS — E871 Hypo-osmolality and hyponatremia: Secondary | ICD-10-CM | POA: Diagnosis not present

## 2021-07-07 DIAGNOSIS — Z5329 Procedure and treatment not carried out because of patient's decision for other reasons: Secondary | ICD-10-CM | POA: Diagnosis not present

## 2021-07-07 DIAGNOSIS — Z5900 Homelessness unspecified: Secondary | ICD-10-CM

## 2021-07-07 DIAGNOSIS — G629 Polyneuropathy, unspecified: Secondary | ICD-10-CM | POA: Diagnosis present

## 2021-07-07 DIAGNOSIS — J189 Pneumonia, unspecified organism: Principal | ICD-10-CM

## 2021-07-07 DIAGNOSIS — F1114 Opioid abuse with opioid-induced mood disorder: Secondary | ICD-10-CM | POA: Diagnosis not present

## 2021-07-07 DIAGNOSIS — F17210 Nicotine dependence, cigarettes, uncomplicated: Secondary | ICD-10-CM | POA: Diagnosis present

## 2021-07-07 DIAGNOSIS — D509 Iron deficiency anemia, unspecified: Secondary | ICD-10-CM | POA: Diagnosis not present

## 2021-07-07 DIAGNOSIS — K219 Gastro-esophageal reflux disease without esophagitis: Secondary | ICD-10-CM | POA: Diagnosis present

## 2021-07-07 DIAGNOSIS — I160 Hypertensive urgency: Secondary | ICD-10-CM | POA: Diagnosis present

## 2021-07-07 DIAGNOSIS — I10 Essential (primary) hypertension: Secondary | ICD-10-CM | POA: Diagnosis not present

## 2021-07-07 DIAGNOSIS — F1110 Opioid abuse, uncomplicated: Secondary | ICD-10-CM | POA: Diagnosis not present

## 2021-07-07 DIAGNOSIS — J154 Pneumonia due to other streptococci: Secondary | ICD-10-CM | POA: Diagnosis not present

## 2021-07-07 DIAGNOSIS — Z79899 Other long term (current) drug therapy: Secondary | ICD-10-CM

## 2021-07-07 DIAGNOSIS — F319 Bipolar disorder, unspecified: Secondary | ICD-10-CM | POA: Diagnosis present

## 2021-07-07 DIAGNOSIS — J440 Chronic obstructive pulmonary disease with acute lower respiratory infection: Secondary | ICD-10-CM | POA: Diagnosis present

## 2021-07-07 DIAGNOSIS — R531 Weakness: Secondary | ICD-10-CM | POA: Diagnosis present

## 2021-07-07 DIAGNOSIS — G8929 Other chronic pain: Secondary | ICD-10-CM | POA: Diagnosis present

## 2021-07-07 DIAGNOSIS — E43 Unspecified severe protein-calorie malnutrition: Secondary | ICD-10-CM | POA: Diagnosis present

## 2021-07-07 DIAGNOSIS — Z8711 Personal history of peptic ulcer disease: Secondary | ICD-10-CM

## 2021-07-07 DIAGNOSIS — F32A Depression, unspecified: Secondary | ICD-10-CM | POA: Diagnosis present

## 2021-07-07 DIAGNOSIS — Z86718 Personal history of other venous thrombosis and embolism: Secondary | ICD-10-CM

## 2021-07-07 DIAGNOSIS — Z681 Body mass index (BMI) 19 or less, adult: Secondary | ICD-10-CM

## 2021-07-07 DIAGNOSIS — F419 Anxiety disorder, unspecified: Secondary | ICD-10-CM | POA: Diagnosis present

## 2021-07-07 DIAGNOSIS — Z833 Family history of diabetes mellitus: Secondary | ICD-10-CM

## 2021-07-07 DIAGNOSIS — Z20822 Contact with and (suspected) exposure to covid-19: Secondary | ICD-10-CM | POA: Diagnosis not present

## 2021-07-07 DIAGNOSIS — Z8616 Personal history of COVID-19: Secondary | ICD-10-CM

## 2021-07-07 DIAGNOSIS — M1990 Unspecified osteoarthritis, unspecified site: Secondary | ICD-10-CM | POA: Diagnosis present

## 2021-07-07 DIAGNOSIS — E538 Deficiency of other specified B group vitamins: Secondary | ICD-10-CM | POA: Diagnosis present

## 2021-07-07 DIAGNOSIS — Z9884 Bariatric surgery status: Secondary | ICD-10-CM

## 2021-07-07 DIAGNOSIS — M549 Dorsalgia, unspecified: Secondary | ICD-10-CM | POA: Diagnosis present

## 2021-07-07 DIAGNOSIS — Z8249 Family history of ischemic heart disease and other diseases of the circulatory system: Secondary | ICD-10-CM

## 2021-07-07 DIAGNOSIS — I248 Other forms of acute ischemic heart disease: Secondary | ICD-10-CM | POA: Diagnosis present

## 2021-07-07 LAB — CBC
HCT: 28.5 % — ABNORMAL LOW (ref 36.0–46.0)
Hemoglobin: 9.4 g/dL — ABNORMAL LOW (ref 12.0–15.0)
MCH: 23.2 pg — ABNORMAL LOW (ref 26.0–34.0)
MCHC: 33 g/dL (ref 30.0–36.0)
MCV: 70.4 fL — ABNORMAL LOW (ref 80.0–100.0)
Platelets: 426 10*3/uL — ABNORMAL HIGH (ref 150–400)
RBC: 4.05 MIL/uL (ref 3.87–5.11)
RDW: 18.9 % — ABNORMAL HIGH (ref 11.5–15.5)
WBC: 13.7 10*3/uL — ABNORMAL HIGH (ref 4.0–10.5)
nRBC: 0 % (ref 0.0–0.2)

## 2021-07-07 LAB — BASIC METABOLIC PANEL
Anion gap: 10 (ref 5–15)
BUN: 11 mg/dL (ref 6–20)
CO2: 23 mmol/L (ref 22–32)
Calcium: 8.2 mg/dL — ABNORMAL LOW (ref 8.9–10.3)
Chloride: 98 mmol/L (ref 98–111)
Creatinine, Ser: 0.71 mg/dL (ref 0.44–1.00)
GFR, Estimated: >60 mL/min (ref >60)
Glucose, Bld: 76 mg/dL (ref 70–99)
Potassium: 2.8 mmol/L — ABNORMAL LOW (ref 3.5–5.1)
Sodium: 131 mmol/L — ABNORMAL LOW (ref 135–145)

## 2021-07-07 LAB — TROPONIN I (HIGH SENSITIVITY)
Troponin I (High Sensitivity): 14 ng/L (ref <18)
Troponin I (High Sensitivity): 20 ng/L — ABNORMAL HIGH (ref <18)

## 2021-07-07 LAB — I-STAT BETA HCG BLOOD, ED (MC, WL, AP ONLY): I-stat hCG, quantitative: <5 m[IU]/mL (ref <5)

## 2021-07-07 MED ORDER — MORPHINE SULFATE (PF) 2 MG/ML IV SOLN
2.0000 mg | Freq: Once | INTRAVENOUS | Status: AC
  Administered 2021-07-07: 2 mg via INTRAVENOUS
  Filled 2021-07-07: qty 1

## 2021-07-07 MED ORDER — POTASSIUM CHLORIDE CRYS ER 20 MEQ PO TBCR
40.0000 meq | EXTENDED_RELEASE_TABLET | Freq: Once | ORAL | Status: AC
  Administered 2021-07-07: 40 meq via ORAL
  Filled 2021-07-07: qty 2

## 2021-07-07 MED ORDER — VANCOMYCIN HCL 1250 MG/250ML IV SOLN: 1250.0000 mg | INTRAVENOUS | Status: DC

## 2021-07-07 MED ORDER — CLONIDINE HCL 0.1 MG PO TABS
0.1000 mg | ORAL_TABLET | Freq: Once | ORAL | Status: AC
  Administered 2021-07-07: 0.1 mg via ORAL
  Filled 2021-07-07: qty 1

## 2021-07-07 MED ORDER — IPRATROPIUM-ALBUTEROL 0.5-2.5 (3) MG/3ML IN SOLN
3.0000 mL | Freq: Once | RESPIRATORY_TRACT | Status: AC
Start: 2021-07-07 — End: 2021-07-07
  Administered 2021-07-07: 3 mL via RESPIRATORY_TRACT
  Filled 2021-07-07: qty 3

## 2021-07-07 MED ORDER — DEXAMETHASONE SODIUM PHOSPHATE 10 MG/ML IJ SOLN
10.0000 mg | Freq: Once | INTRAMUSCULAR | Status: AC
Start: 2021-07-07 — End: 2021-07-07
  Administered 2021-07-07: 10 mg via INTRAVENOUS
  Filled 2021-07-07: qty 1

## 2021-07-07 MED ORDER — VANCOMYCIN HCL 1250 MG/250ML IV SOLN
1250.0000 mg | Freq: Once | INTRAVENOUS | Status: AC
  Administered 2021-07-07: 1250 mg via INTRAVENOUS
  Filled 2021-07-07: qty 250

## 2021-07-07 MED ORDER — HYDRALAZINE HCL 20 MG/ML IJ SOLN
10.0000 mg | Freq: Once | INTRAMUSCULAR | Status: AC
  Administered 2021-07-07: 10 mg via INTRAVENOUS
  Filled 2021-07-07: qty 1

## 2021-07-07 MED ORDER — SODIUM CHLORIDE 0.9 % IV SOLN
2.0000 g | Freq: Three times a day (TID) | INTRAVENOUS | Status: DC
  Administered 2021-07-07 – 2021-07-08 (×2): 2 g via INTRAVENOUS
  Filled 2021-07-07 (×2): qty 2

## 2021-07-07 NOTE — H&P (Addendum)
[facility] Admission History and Physical Service Pager: [phone]  Patient name: Terri [name] Medical record number: [id number] Date of birth: 01/08/1976 Age: 46 y.o. Gender: female  Primary Care Provider: Pcp, No Consultants: None Code Status: Full Preferred Emergency Contact: Terri [name], daughter, [phone]   Chief Complaint: Shortness of Breath and Chest Pain  Assessment and Plan: Terri [name] is a 46 y.o. female presenting with worsening shortness of breath and chest pain. PMH is significant for HTN, h/o DVT, E. Coli bacteremia, polysubstance use disorder, IDA, bipolar disorder/anxiety/depression, h/o drug overdose, vitamin B12 deficiency, h/o gastric bypass.   Chest pain   Shortness of Breath   Multifocal PNA   Presumed COPD Patient reports ongoing shortness of breath, chest pain, fever/chills. Recently admitted for the same, left AMA on 2/11. Returns today after she was seen at [facility] for substance abuse help and was directed to the ED. Vitals on admission remarkable for tachycardia to 102, and hypertension to 208/130. Maintaining appropriate O2 sats on room air.  Admission labs remarkable for WBC 13.7, Hgb 9.4, K 2.8. CXR with persistent ill-defined opacities within the bilateral lung bases, suspicious for multifocal pneumonia.  She was started on empiric antibiotic coverage with vancomycin and cefepime. She has not been taking the azithromycin and cefdinir that she was prescribed after leaving AMA last admission. Blood cultures obtained last admission were negative at four days. Repeat cultures were not obtained prior to initiation of antibiotics in the ED.  She also likely has a component of COPD, though has never had formal PFTs. Admission CXR showed hyperinflation and she has a long smoking history. She received a 10mg  dose of Decadron in the ED. Tuberculosis is also on the differential as she has a history of housing insecurity and drug use and  recent weight loss (13lbs in 3 months) and bloody sputum.  - Admit to progressive, Dr. Gwendlyn Deutscher attending - Continue Cefepime and Vanc - MRSA PCR, d/c vanc if neg - Consider quant gold to rule out TB - AM CBC, CMP - PT/OT eval and treat - Incentive spirometer  Hypertensive Urgency Hypertension Patient's BP on arrival to the ED was 208/130. EKG without ischemic changes. Trop 14>20. Cr 0.71 suggesting no end-organ renal involvement. Patient reports not taking any of her home BP meds since leaving AMA three days ago. Home regimen is amlodipine 10mg  daily, hydralazine 50mg  BID, and lisinopril 10mg  daily. She also has a known history of cocaine use, but states that her last use was two days ago. She reports peripheral blurring of her vision for the past 1.5 days. S/p clonidine 0.1mg  PO x1 and hydralazine 10mg  IV x1 in the ED.  - Will dose her home BP meds now - Cardiac monitoring - If BP does not come down and visual symptoms do not change, will consult CCM for possible ICU transfer - Continue home hydralazine 50mg  BID, amlodipine 10mg  daily, and lisinopril 10mg  daily  Elevated Troponin  Troponin 14>20. EKG with no ischemic changes. Likely demand in the setting of hypertensive urgency as above. - Recheck troponin in am to ensure no continued rise  Hypokalemia K 2.8 on admission. S/p 47mEq in the ED. - Will order 95mEq for two more doses - Recheck on am CMP   Microcytic Anemia GERD   Peptic Ulcer Disease (2020) Vitamin B12 Deficiency   History of Gastric Bypass Hgb 9.4 with MCV 70.4. Recent baseline hemoglobin seems to be around 10-11. No evidence of active bleeding, but she does  have marked epigastric tenderness and a history of peptic ulcer disease in 12/2018. If Hgb drops further, consider workup for upper GI bleed. Patient has never had a colonoscopy.  She has oral iron supplements listed in her chart but has not been taking them.  An iron panel, B12, folate were ordered last admission but  the patient refused these. - Iron, TIBC, Folate, B12  - Protonix 40mg  daily - Continue home iron  Polysubstance Abuse Patient with frequent cocaine and heroin use.  Denies EtOH use.  Last use of both cocaine and heroin was 2/12.  She presented to [facility] earlier today in hopes of getting help with her substance abuse.  She declined Suboxone therapy last admission but is open to it at this time. -COWS -We will discuss Suboxone therapy with her in the morning - Will check Hep B/C, HIV -TOC consult  Tobacco Use 1 pack/day for 35 years. - Nicotine patch available upon request   History of DVT 2012.  Not on any anticoagulation.  Was tachycardic on admission, but this has resolved.  No limb edema to suggest clot at this time.  Had CTA chest on 1/31 that was negative for PE. -No indication for further investigation at this time. Continue to monitor  Low BMI   Protein calorie malnutrition BMI 17.5.  Weight is down 13 pounds since October 2022. -Consider RD c/s  -AM CMP to assess protein status -Multivitamin -Ensure BID between meals   Bipolar Disorder   Anxiety   Depression Home med is hydroxyzine 25mg  q8h PRN for anxiety. - Will give hydroxyzine for anxiety  Housing Insecurity  Patient lives in a friend's car. - Consult to [facility]   FEN/GI: Heart healthy Prophylaxis: Lovenox  Disposition: Progressive  History of Present Illness:  Terri [name] is a 46 y.o. female presenting with worsening shortness of breath, chest pain, fever/chills.  She was admitted to our service on 2/11 for sepsis and multifocal pneumonia.  At that time, she presented for cough productive of green, occasionally blood-tinged sputum and shortness of breath.  She has also had ongoing "sharp, stabbing" chest pain for about a month that is left-sided and radiates to the left scapula.  She says that the pain has not changed in quality but has become more intense and more frequent to the point that it has been constant  since she left the hospital AMA on 2/11.  It is worse with coughing and deep inspiration. In the interval time period since she left the hospital, she did use heroin and cocaine on 2/12.  Denies use of any substances in the past 2 days.  Her chest pain has been constant and worrisome to her.  1-1/2 days ago she began to notice blurriness in the periphery of her vision that was concerning to her.  She has not taken any of her home medications since leaving the hospital and never picked up the antibiotics that were sent to her outpatient pharmacy when she left AMA. She presented to [facility] today in hopes of getting help with her cocaine and heroin use and was told that given her history of leaving AMA and ongoing shortness of breath and chest pain, she would need to be medically cleared before they would pursue substance abuse treatment.  They transferred her [facility] the [facility] ED via EMS.  She is an everyday smoker for the past 35 years.  Smokes 1 pack/day.  Lives in a friend's car.  Review Of Systems:   Review of Systems  Constitutional:  Positive for chills, fatigue, fever and unexpected weight change.  Eyes:  Positive for photophobia and visual disturbance.  Respiratory:  Positive for cough, chest tightness and shortness of breath.   Cardiovascular:  Positive for chest pain. Negative for leg swelling.  Gastrointestinal:  Positive for abdominal pain (epigastric).  Musculoskeletal:  Positive for back pain.  All other systems reviewed and are negative.   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 coli) ([place]) 02/11/2019   AKI (acute kidney injury) ([place])    Peptic ulcer disease 02/09/2019   S/P gastric bypass 02/09/2019   Persistent fever 02/09/2019   COVID-19 virus not detected    Chest pain    Bacteremia due to Escherichia coli    Esophagitis    Hypokalemia    Hypomagnesemia    Sepsis ([place]) 02/02/2019   Leucocytosis 02/02/2019   Thrombocytosis 02/02/2019   Acute lower UTI 02/02/2019   Apnea 09/13/2017   Essential hypertension    Cellulitis and abscess of neck 08/12/2017   Cellulitis of submandibular region 08/10/2017   Odontogenic infection of jaw 08/10/2017   Cholecystitis 02/14/2017   Opioid abuse with opioid-induced mood disorder ([place]) 01/25/2017   Anxiety 02/11/2014   Depression 07/12/2012    Past Medical History: Past Medical History:  Diagnosis Date   Arthritis    Back and legs   Chronic back pain    DVT (deep venous thrombosis) (HCC)    early 20's leg   GIB (gastrointestinal bleeding) 09/13/2017   History of blood transfusion    Hypertension    Migraine headache    Mood disorder ([place])    previously documented as bipolar and MDD   Neuropathy    Pneumonia    Positive PPD    x 2 last time 09/2017   Sciatica    Stress incontinence     Past Surgical History: Past Surgical History:  Procedure Laterality Date   ALVEOLOPLASTY Bilateral 01/31/2018   Procedure: ALVEOLOPLASTY;  Surgeon: Diona Browner, DDS;   Location: [place];  Service: Oral Surgery;  Laterality: Bilateral;   ANKLE SURGERY Left 1992   with hardware   BACK SURGERY     BIOPSY  02/08/2019   Procedure: BIOPSY;  Surgeon: Rush Landmark Telford Nab., MD;  Location: [facility];  Service: Gastroenterology;;   CESAREAN SECTION     CHOLECYSTECTOMY N/A 02/15/2017   Procedure: LAPAROSCOPIC CHOLECYSTECTOMY;  Surgeon: Clovis Riley, MD;  Location: [facility];  Service: General;  Laterality: N/A;   ENTEROSCOPY N/A 02/08/2019   Procedure: ENTEROSCOPY;  Surgeon: Irving Copas., MD;  Location:  [facility];  Service: Gastroenterology;  Laterality: N/A;   ESOPHAGOGASTRODUODENOSCOPY N/A 01/17/2021   Procedure: ESOPHAGOGASTRODUODENOSCOPY (EGD);  Surgeon: Juanita Craver, MD;  Location: [facility];  Service: Endoscopy;  Laterality: N/A;   ESOPHAGOGASTRODUODENOSCOPY (EGD) WITH PROPOFOL N/A 09/15/2017   Procedure: ESOPHAGOGASTRODUODENOSCOPY (EGD) WITH PROPOFOL;  Surgeon: Clarene Essex, MD;  [facility]: [facility];  Service: Endoscopy;  Laterality: N/A;   ESOPHAGOGASTRODUODENOSCOPY (EGD) WITH PROPOFOL N/A 02/08/2019   Procedure: ESOPHAGOGASTRODUODENOSCOPY (EGD) WITH PROPOFOL;  Surgeon: Rush Landmark Telford Nab., MD;  Location: [facility];  Service: Gastroenterology;  Laterality: N/A;   GASTRIC BYPASS     INCISION AND DRAINAGE OF PERITONSILLAR ABCESS Left 08/13/2017   Procedure: INCISION AND DRAINAGE OF LEFT NECK ABSCESS;  Surgeon: Melida Quitter, MD;  Location: [facility];  Service: ENT;  Laterality: Left;   LAPAROSCOPIC LYSIS OF ADHESIONS  02/15/2017   Procedure: LAPAROSCOPIC LYSIS OF ADHESIONS;  Surgeon: Clovis Riley, MD;  Location[facility];  Service: General;;   LUMBAR DISC SURGERY     LUMBAR FUSION     SAVORY DILATION N/A 02/08/2019   Procedure: SAVORY DILATION;  Surgeon: Irving Copas., MD;  [facility];  Service: Gastroenterology;  Laterality: N/A;   TOOTH EXTRACTION Bilateral 01/31/2018   Procedure: DENTAL RESTORATION/EXTRACTIONS;   Surgeon: Diona Browner, DDS;  Location: [place];  Service: Oral Surgery;  Laterality: Bilateral;   TUBAL LIGATION     UPPER GI ENDOSCOPY N/A 02/15/2017   Procedure: UPPER GI ENDOSCOPY;  Surgeon: Clovis Riley, MD;  Location: [facility];  Service: General;  Laterality: N/A;    Social History: Social History   Tobacco Use   Smoking status: Every Day    Packs/day: 1.00    Years: 29.00    Pack years: 29.00    Types: Cigarettes   Smokeless tobacco: Never  Vaping Use   Vaping Use: Never used  Substance Use Topics   Alcohol use: No   Drug use: Yes    Types: Cocaine, Heroin    Comment: Patient states she smokes cocaine     Family History: Family History  Problem Relation Age of Onset   Diabetes Mother    Hypertension Mother    Diabetes Father    Hypertension Father     Allergies and Medications: Allergies  Allergen Reactions   Nsaids Other (See Comments)    Stomach ulcer    Ketorolac Tromethamine Itching and Nausea And Vomiting   No current facility-administered medications on file prior to encounter.   Current Outpatient Medications on File Prior to Encounter  Medication Sig Dispense Refill   acetaminophen (TYLENOL) 500 MG tablet Take 2 tablets (1,000 mg total) by mouth every 8 (eight) hours as needed for mild pain. 30 tablet 0   amLODipine (NORVASC) 10 MG tablet Take 1 tablet (10 mg total) by mouth daily. 30 tablet 0   azithromycin (ZITHROMAX) 500 MG tablet Take 1 tablet (500 mg total) by mouth daily for 5 days. Take 1 tablet daily for 5 days. 5 tablet 0   cefdinir (OMNICEF) 300 MG capsule Take 1 capsule (300 mg total) by mouth 2 (two) times daily for 7 days. 14 capsule 0   Ferrous Fumarate (IRON) 18 MG TBCR Take 1 tablet (18 mg total) by mouth 2 (two) times daily after a meal. 60 tablet 0   hydrALAZINE (APRESOLINE) 50 MG tablet Take 1 tablet (50 mg total) by mouth 2 (two) times daily. 60 tablet 0   hydrOXYzine (ATARAX/VISTARIL) 25 MG tablet Take 1 tablet (25 mg total) by  mouth every 8 (eight) hours as needed for anxiety. 20 tablet 0   lisinopril (ZESTRIL) 10 MG tablet Take 1 tablet (10 mg total) by mouth daily. 30 tablet 0   nicotine (NICODERM CQ - DOSED IN MG/24 HOURS) 21 mg/24hr patch Place 1 patch (21 mg total) onto the skin daily. 28 patch 0   ondansetron (ZOFRAN-ODT) 4 MG disintegrating tablet Take 1 tablet (4 mg total) by mouth every 8 (eight) hours as needed for nausea or vomiting. 20 tablet 0   pantoprazole (PROTONIX) 40 MG tablet Take 1 tablet (40 mg total) by mouth daily. 30 tablet 3   vitamin B-12 (CYANOCOBALAMIN) 1000 MCG tablet Take 1 tablet (1,000 mcg total) by mouth daily. (Patient not taking: Reported on 07/04/2021) 30 tablet 0    Objective: BP (!) 213/137    Pulse 90    Temp 97.9 F (36.6 C)    Resp 16    Ht 5\' 9"  (1.753 m)    Wt 54 kg    LMP  (LMP Unknown)    SpO2 98%    BMI 17.57 kg/m  Exam: General: Frail, cachectic, non-toxic but mild distress Eyes: PERRL, EOMs intact ENTM: Edentulous, MMM Neck: No LAD or thyromegaly Cardiovascular: RRR, no m/r/g Respiratory: Diffuse rhonchi, normal WOB on RA, no wheeze, decreased basilar breath sounds Gastrointestinal: epigastric tenderness, bowel sounds normal MSK: thin extremities, Without edema or deformity Derm: Warm and dry, b/l great toe abrasions Neuro: alert, oriented, Answers questions appropriately Psych: Mood and affect normal   Labs and Imaging: CBC BMET  Recent Labs  Lab 07/07/21 1720  WBC 13.7*  HGB 9.4*  HCT 28.5*  PLT 426*   Recent Labs  Lab 07/07/21 1720  NA 131*  K 2.8*  CL 98  CO2 23  BUN 11  CREATININE 0.71  GLUCOSE 76  CALCIUM 8.2*     EKG: NSR, no ST elevation/depression, LVH, QTc 432, evidence of LAE; personally reviewed by me   DG Chest 2 View Addendum: ADDENDUM REPORT: 07/07/2021 18:13   ADDENDUM:  Please note, the first line under the FINDINGS section should read:  Portions of the posterior costophrenic angles are excluded from the  field of view on  the lateral view radiograph, bilaterally.   Electronically Signed    By: Kellie Simmering D.O.    On: 07/07/2021 18:13 Narrative: CLINICAL DATA:  Provided history: Chest pain. Additional history provided: Left-sided chest pain and shortness of breath for 2 weeks. History of hypertension, pneumonia, DVT.  EXAM: CHEST - 2 VIEW  COMPARISON:  Prior chest radiographs 07/03/2021 and earlier.  FINDINGS: Portions of the posterior costophrenic angles are excluded from the field of view, bilaterally. Heart size within normal limits. Hyperinflated lungs. Persistent ill-defined opacities within the bilateral lung bases. A trace right pleural effusion is questioned. No evidence of pneumothorax. No acute bony abnormality identified. Degenerative changes of the spine.  IMPRESSION: Persistent ill-defined opacities within the bilateral lung bases, suspicious for multifocal pneumonia. Radiographic follow-up to complete resolution recommended.  Hyperinflated lungs suggesting COPD.  Possible trace right pleural effusion.  Electronically Signed: By: Kellie Simmering D.O. On: 07/07/2021 18:05    Eppie Gibson, MD 07/07/2021, 11:28 PM PGY-1, [facility] pager: [phone], text pages welcome   FPTS Upper-Level Resident Addendum   I have independently interviewed and examined the patient. I have discussed the above with the original author and agree with their documentation. My edits for correction/addition/clarification are in within the document. Please see also  any attending notes.   Lyndee Hensen, DO PGY-3, [facility] 07/08/2021 1:53 AM  FPTS Service pager: [phone] (text pages welcome through [facility])

## 2021-07-07 NOTE — Discharge Instructions (Addendum)
Transfer to [facility] for medical clearance. °

## 2021-07-07 NOTE — Progress Notes (Signed)
Pharmacy Antibiotic Note  Terri [name] is a 46 y.o. female admitted on 07/07/2021 with pneumonia.  Pharmacy has been consulted for vancomycin and cefepime dosing.  Plan: Vancomycin 1250 mg x1, then vancomycin 1250 mg q24h (eAUC 478.8, goal AUC=400-550, SCr used = 0.8) Cefepime 2g q8h Trend WBC, temp, renal function  F/U infectious work-up Drug levels as indicated   Height: 5\' 9"  (175.3 cm) Weight: 54 kg (119 lb) IBW/kg (Calculated) : 66.2  Temp (24hrs), Avg:98.2 F (36.8 C), Min:97.7 F (36.5 C), Max:99.2 F (37.3 C)  Recent Labs  Lab 07/03/21 1852 07/03/21 2116 07/07/21 1720  WBC 17.6*  --  13.7*  CREATININE 1.10*  --  0.71  LATICACIDVEN  --  0.9  --     Estimated Creatinine Clearance: 74.1 mL/min (by C-G formula based on SCr of 0.71 mg/dL).    Allergies  Allergen Reactions   Nsaids Other (See Comments)    Stomach ulcer    Ketorolac Tromethamine Itching and Nausea And Vomiting    Antimicrobials this admission: 2/14 vancomycin >>  2/14 cefepime >>   Dose adjustments this admission: None  Microbiology results: 2/14 BCx: pending 2/12 UCx: multiple species, requires recollect   Thank you for allowing pharmacy to be a part of this patients care.  Joseph Art, Pharm.D. PGY-1 Pharmacy Resident [phone] 07/07/2021 10:29 PM

## 2021-07-07 NOTE — ED Triage Notes (Signed)
Per [facility] pt coming from [facility]. Was there for help with substance abuse. Was sent here for TB rule out since she was not medically cleared on her last visit here.

## 2021-07-07 NOTE — ED Provider Notes (Signed)
[facility] EMERGENCY DEPARTMENT Provider Note   CSN: [id number] Arrival date & time: 07/07/21  1704     History Chief Complaint  Patient presents with   Chest Pain   Shortness of Breath    Terri [name] is a 46 y.o. female with hx of polysubstance abuse, pneumonia with subsequent sepsis who presents to the emergency department with chest pain, shortness of breath, and subjective fever.  Patient left AMA 3 days ago after being admitted for multifocal pneumonia and subsequent sepsis. She presented to [facility] today for helping with cocaine abuse help. She was sent here for further evaluation.Patient states she feels worse since she signed out AMA. She has not been taking her Cefdinir or azithromycin. Denies any cocaine use today.   Chest Pain Associated symptoms: shortness of breath   Shortness of Breath Associated symptoms: chest pain       Home Medications Prior to Admission medications   Medication Sig Start Date End Date Taking? Authorizing Provider  acetaminophen (TYLENOL) 500 MG tablet Take 2 tablets (1,000 mg total) by mouth every 8 (eight) hours as needed for mild pain. 05/29/21   Mariel Aloe, MD  amLODipine (NORVASC) 10 MG tablet Take 1 tablet (10 mg total) by mouth daily. 05/30/21 08/28/21  Jacqlyn Larsen, PA-C  azithromycin (ZITHROMAX) 500 MG tablet Take 1 tablet (500 mg total) by mouth daily for 5 days. Take 1 tablet daily for 5 days. 07/04/21 07/09/21  Eppie Gibson, MD  cefdinir (OMNICEF) 300 MG capsule Take 1 capsule (300 mg total) by mouth 2 (two) times daily for 7 days. 07/04/21 07/11/21  Eppie Gibson, MD  Ferrous Fumarate (IRON) 18 MG TBCR Take 1 tablet (18 mg total) by mouth 2 (two) times daily after a meal. 04/06/21   Domenic Polite, MD  hydrALAZINE (APRESOLINE) 50 MG tablet Take 1 tablet (50 mg total) by mouth 2 (two) times daily. 05/30/21 08/28/21  Jacqlyn Larsen, PA-C  hydrOXYzine (ATARAX/VISTARIL) 25 MG tablet Take 1 tablet (25 mg total) by mouth  every 8 (eight) hours as needed for anxiety. 04/06/21   Domenic Polite, MD  lisinopril (ZESTRIL) 10 MG tablet Take 1 tablet (10 mg total) by mouth daily. 05/30/21 08/28/21  Jacqlyn Larsen, PA-C  nicotine (NICODERM CQ - DOSED IN MG/24 HOURS) 21 mg/24hr patch Place 1 patch (21 mg total) onto the skin daily. 04/07/21   Domenic Polite, MD  ondansetron (ZOFRAN-ODT) 4 MG disintegrating tablet Take 1 tablet (4 mg total) by mouth every 8 (eight) hours as needed for nausea or vomiting. 04/21/21   Tedd Sias, PA  pantoprazole (PROTONIX) 40 MG tablet Take 1 tablet (40 mg total) by mouth daily. 03/18/21   Danford, Suann Larry, MD  vitamin B-12 (CYANOCOBALAMIN) 1000 MCG tablet Take 1 tablet (1,000 mcg total) by mouth daily. Patient not taking: Reported on 07/04/2021 04/06/21   Domenic Polite, MD      Allergies    Nsaids and Ketorolac tromethamine    Review of Systems   Review of Systems  Respiratory:  Positive for shortness of breath.   Cardiovascular:  Positive for chest pain.  All other systems reviewed and are negative.  Physical Exam Updated Vital Signs BP (!) 218/138    Pulse 81    Temp 97.9 F (36.6 C)    Resp (!) 27    Ht 5\' 9"  (1.753 m)    Wt 54 kg    LMP  (LMP Unknown)    SpO2 98%  BMI 17.57 kg/m  Physical Exam Vitals and nursing note reviewed.  Constitutional:      General: She is not in acute distress.    Appearance: Normal appearance.  HENT:     Head: Normocephalic and atraumatic.  Eyes:     General:        Right eye: No discharge.        Left eye: No discharge.  Cardiovascular:     Comments: Regular rate and rhythm.  S1/S2 are distinct without any evidence of murmur, rubs, or gallops.  Radial pulses are 2+ bilaterally.  Dorsalis pedis pulses are 2+ bilaterally.  No evidence of pedal edema. Pulmonary:     Comments: Tachypneic.  Taking shallow breaths.  Diffuse rhonchi and wheeze. Abdominal:     General: Abdomen is flat. Bowel sounds are normal. There is no distension.      Tenderness: There is no abdominal tenderness. There is no guarding or rebound.  Musculoskeletal:        General: Normal range of motion.     Cervical back: Neck supple.  Skin:    General: Skin is warm and dry.     Findings: No rash.  Neurological:     General: No focal deficit present.     Mental Status: She is alert.  Psychiatric:        Mood and Affect: Mood normal.        Behavior: Behavior normal.    ED Results / Procedures / Treatments   Labs (all labs ordered are listed, but only abnormal results are displayed) Labs Reviewed  BASIC METABOLIC PANEL - Abnormal; Notable for the following components:      Result Value   Sodium 131 (*)    Potassium 2.8 (*)    Calcium 8.2 (*)    All other components within normal limits  CBC - Abnormal; Notable for the following components:   WBC 13.7 (*)    Hemoglobin 9.4 (*)    HCT 28.5 (*)    MCV 70.4 (*)    MCH 23.2 (*)    RDW 18.9 (*)    Platelets 426 (*)    All other components within normal limits  TROPONIN I (HIGH SENSITIVITY) - Abnormal; Notable for the following components:   Troponin I (High Sensitivity) 20 (*)    All other components within normal limits  I-STAT BETA HCG BLOOD, ED ([facility], [facility], [facility] ONLY)  TROPONIN I (HIGH SENSITIVITY)    EKG EKG Interpretation  Date/Time:  Tuesday July 07 2021 21:45:37 EST Ventricular Rate:  90 PR Interval:  148 QRS Duration: 83 QT Interval:  353 QTC Calculation: 432 R Axis:   85 Text Interpretation: Age not entered, assumed to be  46 years old for purpose of ECG interpretation Sinus rhythm Probable left atrial enlargement Left ventricular hypertrophy Borderline T abnormalities, inferior leads Confirmed by Lacretia Leigh ([id number]) on 07/07/2021 9:51:52 PM  Radiology DG Chest 2 View  Addendum Date: 07/07/2021   ADDENDUM REPORT: 07/07/2021 18:13 ADDENDUM: Please note, the first line under the FINDINGS section should read: Portions of the posterior costophrenic angles are excluded from  the field of view on the lateral view radiograph, bilaterally. Electronically Signed   By: Kellie Simmering D.O.   On: 07/07/2021 18:13   Result Date: 07/07/2021 CLINICAL DATA:  Provided history: Chest pain. Additional history provided: Left-sided chest pain and shortness of breath for 2 weeks. History of hypertension, pneumonia, DVT. EXAM: CHEST - 2 VIEW COMPARISON:  Prior chest radiographs 07/03/2021 and  earlier. FINDINGS: Portions of the posterior costophrenic angles are excluded from the field of view, bilaterally. Heart size within normal limits. Hyperinflated lungs. Persistent ill-defined opacities within the bilateral lung bases. A trace right pleural effusion is questioned. No evidence of pneumothorax. No acute bony abnormality identified. Degenerative changes of the spine. IMPRESSION: Persistent ill-defined opacities within the bilateral lung bases, suspicious for multifocal pneumonia. Radiographic follow-up to complete resolution recommended. Hyperinflated lungs suggesting COPD. Possible trace right pleural effusion. Electronically Signed: By: Kellie Simmering D.O. On: 07/07/2021 18:05    Procedures Procedures  Cardiac telemetry revealed elevated blood pressure and normal sinus rhythm.   Medications Ordered in ED Medications  vancomycin (VANCOREADY) IVPB 1250 mg/250 mL (has no administration in time range)  ceFEPIme (MAXIPIME) 2 g in sodium chloride 0.9 % 100 mL IVPB (0 g Intravenous Stopped 07/07/21 2329)  vancomycin (VANCOREADY) IVPB 1250 mg/250 mL (has no administration in time range)  morphine (PF) 2 MG/ML injection 2 mg (has no administration in time range)  dexamethasone (DECADRON) injection 10 mg (10 mg Intravenous Given 07/07/21 2247)  ipratropium-albuterol (DUONEB) 0.5-2.5 (3) MG/3ML nebulizer solution 3 mL (3 mLs Nebulization Given 07/07/21 2251)  morphine (PF) 2 MG/ML injection 2 mg (2 mg Intravenous Given 07/07/21 2245)  cloNIDine (CATAPRES) tablet 0.1 mg (0.1 mg Oral Given 07/07/21 2249)   potassium chloride SA (KLOR-CON M) CR tablet 40 mEq (40 mEq Oral Given 07/07/21 2248)    ED Course/ Medical Decision Making/ A&P                           Medical Decision Making Risk Prescription drug management.   SHELLYE ZANDI is a 46 y.o. female with multiple comorbidities to impact her care today including polysubstance abuse and pneumonia who presents to the emergency department with chest pain, shortness of breath.  This is likely unresolved multifocal pneumonia from her previous admission.  Labs were ordered in triage.  I personally reviewed the imaging and labs.  CBC reveals leukocytosis and anemia.  BMP showed hypokalemia and hyponatremia.  Troponin was elevated.  This is likely demand ischemia.  Chest x-ray did show right-sided pleural effusion and multifocal pneumonia.  Pregnancy negative.  We will plan to start her on broad-spectrum antibiotics, steroids, nebulizer treatments, potassium supplementation, and clonidine for her hypertension.  On reevaluation, blood pressure is still elevated but dropping after clonidine.  Patient still complaining of chest pain.  We will give her another round of morphine.  Patient does sound slightly better after nebulizer treatment.  Given the clinical scenario I do believe patient would benefit from further evaluation in the hospital.  I spoke with the medicine service as patient is a bounce back.  They agree to admit.  Final Clinical Impression(s) / ED Diagnoses Final diagnoses:  Multifocal pneumonia    Rx / DC Orders ED Discharge Orders     None         Cherrie Gauze 07/07/21 2332    Lacretia Leigh, MD 07/09/21 [phone]

## 2021-07-07 NOTE — ED Provider Triage Note (Signed)
Emergency Medicine Provider Triage Evaluation Note  Terri [name] , a 46 y.o. female  was evaluated in triage.  Pt complains of chest pain and shortness of breath x2 days.  Chest pain is left-sided, been worsening.  Was seen at behavioral health, sent here because she was not medically cleared.  Patient was seen for pneumonia versus TB 2 days ago, left AGAINST MEDICAL ADVICE.  Last cocaine use was yesterday.  Review of Systems  Positive: Chest pain shortness of breath Negative: syncope  Physical Exam  BP (!) 208/130 (BP Location: Left Arm)    Pulse (!) 102    Temp 98.6 F (37 C) (Oral)    Resp 18    LMP  (LMP Unknown)    SpO2 95%  Gen:   Awake, no distress   Resp:  Normal effort  MSK:   Moves extremities without difficulty  Other:  Adventitious lung sounds bilaterally.  No tachypnea.  Patient is tachycardic.    Medical Decision Making  Medically screening exam initiated at 5:14 PM.  Appropriate orders placed.  [name] was informed that the remainder of the evaluation will be completed by another provider, this initial triage assessment does not replace that evaluation, and the importance of remaining in the ED until their evaluation is complete.     [name], PA-C 07/07/21 1718

## 2021-07-07 NOTE — ED Provider Notes (Signed)
Behavioral Health Urgent Care Medical Screening Exam  Patient Name: Terri [name] MRN: [id number] Date of Evaluation: 07/07/21 Chief Complaint:   Diagnosis:  Final diagnoses:  Cocaine abuse ([place])    History of Present illness: Terri [name] is a 46 y.o. female patient presented to [facility] as a walk in alone  requesting substance abuse for cocaine abuse.   [name], 46 y.o., female patient seen face to face by this provider, consulted with Dr. Serafina [name]; and chart reviewed on 07/07/21.  Per chart review patient was seen at the [facility] and left AMA on 07/04/2021. She was not medically cleared at that time. She met sepsis criteria at the time and Tuberculosis was not ruled out. Per chart review patient was educated on the risk including death if she left AMA.   She is alert/oriented and cooperative. She does not appear to be in acute distress but she states she is having chest pain and SOB. Her blood pressure 183/143 and HR 107.  She is denying SI/HI and AVH at this time.     Once patient is medically cleared the Social worker can facilitate referral for substance abuse treatment.        Psychiatric Specialty Exam  Presentation  General Appearance:Disheveled  Eye Contact:Good  Speech:Clear and Coherent; Normal Rate  Speech Volume:Normal  Handedness:Right   Mood and Affect  Mood:Depressed  Affect:Congruent   Thought Process  Thought Processes:Coherent  Descriptions of Associations:Intact  Orientation:Full (Time, Place and Person)  Thought Content:Logical  Diagnosis of Schizophrenia or Schizoaffective disorder in past: No  Duration of Psychotic Symptoms: Greater than six months  Hallucinations:None  Ideas of Reference:None  Suicidal Thoughts:No  Homicidal Thoughts:No   Sensorium  Memory:Immediate Good; Recent Good; Remote Good  Judgment:Poor  Insight:Poor   Executive Functions  Concentration:Good  Attention Span:Good  [place] of  Knowledge:Good  Language:Good   Psychomotor Activity  Psychomotor Activity:Normal   Assets  Assets:Communication Skills; Desire for Improvement; Physical Health; Resilience   Sleep  Sleep:No data recorded Number of hours: No data recorded  No data recorded  Physical Exam: Physical Exam Vitals and nursing note reviewed.  Constitutional:      General: She is in acute distress.     Appearance: She is ill-appearing.  HENT:     Head: Normocephalic.  Eyes:     General:        Right eye: No discharge.        Left eye: No discharge.     Conjunctiva/sclera: Conjunctivae normal.  Cardiovascular:     Rate and Rhythm: Normal rate.     Comments: Reports chest pain  Pulmonary:     Effort: Respiratory distress present.     Comments: Patient reports SOB  Musculoskeletal:        General: Normal range of motion.     Cervical back: Normal range of motion.  Skin:    Coloration: Skin is not jaundiced or pale.  Neurological:     Mental Status: She is oriented to person, place, and time.  Psychiatric:        Attention and Perception: Attention and perception normal.        Mood and Affect: Mood is anxious.        Speech: Speech normal.        Behavior: Behavior normal. Behavior is cooperative.        Thought Content: Thought content normal.        Cognition and Memory: Cognition normal.  Judgment: Judgment normal.   Review of Systems  Constitutional: Negative.   HENT: Negative.    Eyes: Negative.   Respiratory: Negative.    Cardiovascular: Negative.   Musculoskeletal: Negative.   Skin: Negative.   Neurological: Negative.   Psychiatric/Behavioral:  Positive for substance abuse. The patient is nervous/anxious.   Blood pressure (!) 183/143, pulse (!) 106, temperature 99.2 F (37.3 C), resp. rate 20, SpO2 98 %. There is no height or weight on file to calculate BMI.  Musculoskeletal: Strength & Muscle Tone: within normal limits Gait & Station: normal Patient leans:  N/A   [facility] MSE Discharge Disposition for Follow up and Recommendations: Based on my evaluation the patient appears to have an emergency medical condition for which I recommend the patient be transferred to the emergency department for further evaluation.   Transfer to the [facility]. Dr. Francia Greaves accepting physician and Shon Millet RN notified.   Patient will be transferred via emergent EMS    Revonda Humphrey, NP 07/07/2021, 4:47 PM

## 2021-07-07 NOTE — Progress Notes (Signed)
°   07/07/21 1621  [place] (Walk-ins at [facility] only)  How Did You Hear About [place]? Self  What Is the Reason for Your Visit/Call Today? Pt is a 46 yo female who presented voluntarily and unaccompanied seeking detox and drug treatment. Pt denied SI, HI, AVH, NSSH and paranoia. Pt reported daily use of crack cocaine but denied all else. Per NP Thomes Lolling, pt was recently at [facility] ED from 2/10-2/11/23 with sepsis and TB and left AMA.  How Long Has This Been Causing You Problems? > than 6 months  Have You Recently Had Any Thoughts About Hurting Yourself? No  Are You Planning to Commit Suicide/Harm Yourself At This time? No  Have you Recently Had Thoughts About [place]? No  Are You Planning To Harm Someone At This Time? No  Are you currently experiencing any auditory, visual or other hallucinations? No  Have You Used Any Alcohol or Drugs in the Past 24 Hours? Yes  How long ago did you use Drugs or Alcohol? crack cocaine- unknown amount  Do you have any current medical co-morbidities that require immediate attention? Yes  Please describe current medical co-morbidities that require immediate attention: sepsis and TB possibly  Clinician description of patient physical appearance/behavior: Disheveled, unkempt, drpressed  What Do You Feel Would Help You the Most Today? Alcohol or Drug Use Treatment  If access to [facility] was not available, would you have sought care in the Emergency Department? Yes  Determination of Need  (Per Thomes Lolling NP, pt is in need of medical clearance before any psych or drug treatment can be initiated.)  Options For Referral Chemical Dependency Intensive Outpatient Therapy (CDIOP)   Loyal Jacobson. Mare Ferrari, [place], [facility], [facility] Triage Specialist [facility]

## 2021-07-08 ENCOUNTER — Inpatient Hospital Stay (HOSPITAL_COMMUNITY): Payer: Medicaid Other

## 2021-07-08 DIAGNOSIS — F1910 Other psychoactive substance abuse, uncomplicated: Secondary | ICD-10-CM

## 2021-07-08 DIAGNOSIS — D509 Iron deficiency anemia, unspecified: Secondary | ICD-10-CM | POA: Diagnosis not present

## 2021-07-08 DIAGNOSIS — E876 Hypokalemia: Secondary | ICD-10-CM

## 2021-07-08 DIAGNOSIS — I160 Hypertensive urgency: Secondary | ICD-10-CM

## 2021-07-08 DIAGNOSIS — R778 Other specified abnormalities of plasma proteins: Secondary | ICD-10-CM

## 2021-07-08 DIAGNOSIS — E538 Deficiency of other specified B group vitamins: Secondary | ICD-10-CM

## 2021-07-08 DIAGNOSIS — Z86718 Personal history of other venous thrombosis and embolism: Secondary | ICD-10-CM

## 2021-07-08 DIAGNOSIS — J189 Pneumonia, unspecified organism: Secondary | ICD-10-CM | POA: Diagnosis not present

## 2021-07-08 DIAGNOSIS — Z9884 Bariatric surgery status: Secondary | ICD-10-CM

## 2021-07-08 DIAGNOSIS — Z5900 Homelessness unspecified: Secondary | ICD-10-CM

## 2021-07-08 DIAGNOSIS — K219 Gastro-esophageal reflux disease without esophagitis: Secondary | ICD-10-CM

## 2021-07-08 LAB — RAPID URINE DRUG SCREEN, HOSP PERFORMED
Amphetamines: POSITIVE — AB
Barbiturates: NOT DETECTED
Benzodiazepines: NOT DETECTED
Cocaine: POSITIVE — AB
Opiates: NOT DETECTED
Tetrahydrocannabinol: NOT DETECTED

## 2021-07-08 LAB — COMPREHENSIVE METABOLIC PANEL
ALT: 11 U/L (ref 0–44)
AST: 19 U/L (ref 15–41)
Albumin: 2.5 g/dL — ABNORMAL LOW (ref 3.5–5.0)
Alkaline Phosphatase: 83 U/L (ref 38–126)
Anion gap: 11 (ref 5–15)
BUN: 9 mg/dL (ref 6–20)
CO2: 22 mmol/L (ref 22–32)
Calcium: 8.5 mg/dL — ABNORMAL LOW (ref 8.9–10.3)
Chloride: 101 mmol/L (ref 98–111)
Creatinine, Ser: 0.74 mg/dL (ref 0.44–1.00)
GFR, Estimated: >60 mL/min (ref >60)
Glucose, Bld: 112 mg/dL — ABNORMAL HIGH (ref 70–99)
Potassium: 3.7 mmol/L (ref 3.5–5.1)
Sodium: 134 mmol/L — ABNORMAL LOW (ref 135–145)
Total Bilirubin: 0.8 mg/dL (ref 0.3–1.2)
Total Protein: 6.4 g/dL — ABNORMAL LOW (ref 6.5–8.1)

## 2021-07-08 LAB — FOLATE: Folate: 8.1 ng/mL (ref >5.9)

## 2021-07-08 LAB — IRON AND TIBC
Iron: 112 ug/dL (ref 28–170)
Saturation Ratios: 37 % — ABNORMAL HIGH (ref 10.4–31.8)
TIBC: 301 ug/dL (ref 250–450)
UIBC: 189 ug/dL

## 2021-07-08 LAB — STREP PNEUMONIAE URINARY ANTIGEN: Strep Pneumo Urinary Antigen: POSITIVE — AB

## 2021-07-08 LAB — CBC
HCT: 32.2 % — ABNORMAL LOW (ref 36.0–46.0)
Hemoglobin: 11 g/dL — ABNORMAL LOW (ref 12.0–15.0)
MCH: 23.7 pg — ABNORMAL LOW (ref 26.0–34.0)
MCHC: 34.2 g/dL (ref 30.0–36.0)
MCV: 69.2 fL — ABNORMAL LOW (ref 80.0–100.0)
Platelets: 485 10*3/uL — ABNORMAL HIGH (ref 150–400)
RBC: 4.65 MIL/uL (ref 3.87–5.11)
RDW: 18.9 % — ABNORMAL HIGH (ref 11.5–15.5)
WBC: 20.9 10*3/uL — ABNORMAL HIGH (ref 4.0–10.5)
nRBC: 0 % (ref 0.0–0.2)

## 2021-07-08 LAB — RESP PANEL BY RT-PCR (FLU A&B, COVID) ARPGX2
Influenza A by PCR: NEGATIVE
Influenza B by PCR: NEGATIVE
SARS Coronavirus 2 by RT PCR: NEGATIVE

## 2021-07-08 LAB — URINALYSIS, ROUTINE W REFLEX MICROSCOPIC
Bacteria, UA: NONE SEEN
Bilirubin Urine: NEGATIVE
Glucose, UA: 50 mg/dL — AB
Hgb urine dipstick: NEGATIVE
Ketones, ur: NEGATIVE mg/dL
Nitrite: NEGATIVE
Protein, ur: NEGATIVE mg/dL
Specific Gravity, Urine: 1.004 — ABNORMAL LOW (ref 1.005–1.030)
pH: 8 (ref 5.0–8.0)

## 2021-07-08 LAB — HIV ANTIBODY (ROUTINE TESTING W REFLEX): HIV Screen 4th Generation wRfx: NONREACTIVE

## 2021-07-08 LAB — CULTURE, BLOOD (ROUTINE X 2)
Culture: NO GROWTH
Culture: NO GROWTH
Special Requests: ADEQUATE
Special Requests: ADEQUATE

## 2021-07-08 LAB — VITAMIN B12: Vitamin B-12: 278 pg/mL (ref 180–914)

## 2021-07-08 LAB — HEPATITIS B SURFACE ANTIGEN: Hepatitis B Surface Ag: NONREACTIVE

## 2021-07-08 LAB — TROPONIN I (HIGH SENSITIVITY): Troponin I (High Sensitivity): 19 ng/L — ABNORMAL HIGH (ref <18)

## 2021-07-08 LAB — FERRITIN: Ferritin: 34 ng/mL (ref 11–307)

## 2021-07-08 MED ORDER — ENOXAPARIN SODIUM 40 MG/0.4ML IJ SOSY: 40.0000 mg | PREFILLED_SYRINGE | Freq: Every day | INTRAMUSCULAR | Status: DC

## 2021-07-08 MED ORDER — LORAZEPAM 2 MG/ML IJ SOLN
1.0000 mg | Freq: Once | INTRAMUSCULAR | Status: AC
  Administered 2021-07-08: 1 mg via INTRAVENOUS
  Filled 2021-07-08: qty 1

## 2021-07-08 MED ORDER — LIDOCAINE 5 % EX PTCH
1.0000 | MEDICATED_PATCH | CUTANEOUS | Status: DC
  Filled 2021-07-08: qty 1

## 2021-07-08 MED ORDER — ENSURE ENLIVE PO LIQD
237.0000 mL | Freq: Two times a day (BID) | ORAL | Status: DC
  Filled 2021-07-08: qty 237

## 2021-07-08 MED ORDER — BUPRENORPHINE HCL-NALOXONE HCL 8-2 MG SL SUBL: 1.0000 | SUBLINGUAL_TABLET | Freq: Two times a day (BID) | SUBLINGUAL | Status: DC

## 2021-07-08 MED ORDER — LISINOPRIL 10 MG PO TABS
10.0000 mg | ORAL_TABLET | Freq: Every day | ORAL | Status: DC
  Administered 2021-07-08: 10 mg via ORAL
  Filled 2021-07-08: qty 1

## 2021-07-08 MED ORDER — ADULT MULTIVITAMIN W/MINERALS CH: 1.0000 | ORAL_TABLET | Freq: Every day | ORAL | Status: DC

## 2021-07-08 MED ORDER — HYDRALAZINE HCL 25 MG PO TABS
50.0000 mg | ORAL_TABLET | Freq: Two times a day (BID) | ORAL | Status: DC
  Administered 2021-07-08: 50 mg via ORAL
  Filled 2021-07-08: qty 2

## 2021-07-08 MED ORDER — HYDROXYZINE HCL 25 MG PO TABS
25.0000 mg | ORAL_TABLET | Freq: Once | ORAL | Status: AC
  Administered 2021-07-08: 25 mg via ORAL
  Filled 2021-07-08: qty 1

## 2021-07-08 MED ORDER — HYDROXYZINE HCL 25 MG PO TABS
25.0000 mg | ORAL_TABLET | Freq: Three times a day (TID) | ORAL | Status: DC | PRN
  Administered 2021-07-08: 25 mg via ORAL
  Filled 2021-07-08: qty 1

## 2021-07-08 MED ORDER — VITAMIN B-12 1000 MCG PO TABS: 1000.0000 ug | ORAL_TABLET | Freq: Every day | ORAL | Status: DC

## 2021-07-08 MED ORDER — FERROUS SULFATE 325 (65 FE) MG PO TABS: 325.0000 mg | ORAL_TABLET | Freq: Two times a day (BID) | ORAL | Status: DC

## 2021-07-08 MED ORDER — MORPHINE SULFATE (PF) 4 MG/ML IV SOLN
4.0000 mg | Freq: Once | INTRAVENOUS | Status: AC
  Administered 2021-07-08: 4 mg via INTRAVENOUS
  Filled 2021-07-08: qty 1

## 2021-07-08 MED ORDER — SODIUM CHLORIDE 0.9 % IV SOLN: 2.0000 g | INTRAVENOUS | Status: DC

## 2021-07-08 MED ORDER — ACETAMINOPHEN 325 MG PO TABS: 650.0000 mg | ORAL_TABLET | Freq: Four times a day (QID) | ORAL | Status: DC | PRN

## 2021-07-08 MED ORDER — AMLODIPINE BESYLATE 5 MG PO TABS
10.0000 mg | ORAL_TABLET | Freq: Every day | ORAL | Status: DC
  Administered 2021-07-08: 10 mg via ORAL
  Filled 2021-07-08: qty 2

## 2021-07-08 MED ORDER — PANTOPRAZOLE SODIUM 40 MG PO TBEC: 40.0000 mg | DELAYED_RELEASE_TABLET | Freq: Every day | ORAL | Status: DC

## 2021-07-08 MED ORDER — AMLODIPINE BESYLATE 5 MG PO TABS: 10.0000 mg | ORAL_TABLET | Freq: Every day | ORAL | Status: DC

## 2021-07-08 MED ORDER — BUPRENORPHINE HCL-NALOXONE HCL 2-0.5 MG SL SUBL
1.0000 | SUBLINGUAL_TABLET | SUBLINGUAL | Status: DC | PRN
  Filled 2021-07-08: qty 1

## 2021-07-08 MED ORDER — POTASSIUM CHLORIDE CRYS ER 20 MEQ PO TBCR
40.0000 meq | EXTENDED_RELEASE_TABLET | ORAL | Status: AC
  Administered 2021-07-08 (×2): 40 meq via ORAL
  Filled 2021-07-08 (×2): qty 2

## 2021-07-08 MED ORDER — LEVALBUTEROL HCL 0.63 MG/3ML IN NEBU: 0.6300 mg | INHALATION_SOLUTION | Freq: Four times a day (QID) | RESPIRATORY_TRACT | Status: DC | PRN

## 2021-07-08 MED ORDER — NICOTINE 21 MG/24HR TD PT24
21.0000 mg | MEDICATED_PATCH | Freq: Every day | TRANSDERMAL | Status: DC
  Filled 2021-07-08: qty 1

## 2021-07-08 MED ORDER — ACETAMINOPHEN 650 MG RE SUPP: 650.0000 mg | Freq: Four times a day (QID) | RECTAL | Status: DC | PRN

## 2021-07-08 NOTE — ED Notes (Signed)
Breakfast Orders Placed °

## 2021-07-08 NOTE — ED Notes (Signed)
;  Pt. Refuse lab work Rn notified

## 2021-07-08 NOTE — ED Notes (Signed)
Pt refused to sign AMA form. I was witness when [place], William Hamburger RN explained risk.

## 2021-07-08 NOTE — ED Notes (Signed)
Pt given 2x apple juice. Asked to get pt back on cardiac monitoring and pt refused.

## 2021-07-08 NOTE — ED Notes (Signed)
Patient was found exiting her room, fully dressed. Stated that she is leaving, patient stated she removed the IV herself. Informed patient signature for leaving against medical advise is required, and visualization of old IV access. Patient stated "I'm not signing anything" , patient then stated, "oh, I thought I took it off (IV). IV access is removed 2x2 gauze applied and secure with tape. Left upper extremity visualized as well, no IV access noted. Patient ambulated out of the unit unassisted. Admitting notified via secure chat.

## 2021-07-08 NOTE — H&P (Signed)
NAME:  Terri [name], MRN:  [id number], DOB:  14-Aug-1975, LOS: 0 ADMISSION DATE:  07/07/2021, CONSULTATION DATE:  07/08/21  REFERRING MD:  Susa Simmonds (family practice), CHIEF COMPLAINT:  shortness of breath, chest pain   History of Present Illness:  Ms. Lampkins is a 46 year old woman with hx of HTN, DVT, Ecoli bacteremia, polysubstance abuse, bipolar disorder/anxiety/depression, hx drug overdose, hx gastric bypass, recent PNA, here with chest pain, SOB, fever.   Left ama 3 days ago. Did not take her antibiotics.   Presented to [facility] today for cocaine abuse issues, sent here for evaluation.   Also notes some blurry vision over past several days.  In ed received Vanc, cefepime, morphine, duoneb, clonidine 0.1mg  Admitted to family practice.  Found to have hypokalemia, hyponatremia. CXR with some basilar opacities. Has been very hypertensive here. Initial BP: 208/130, Highest BP this admit, 226/134.  On prior admit, BP typically ran [id number] systolic   IN ED got decadron. X 1  ROS: recent wt loss 13 lbs in 3 months.  Cough productive, sometimes blood tinged.   Pertinent  Medical History   HTN, DVT, Ecoli bacteremia, polysubstance abuse, bipolar disorder/anxiety/depression, hx drug overdose, IVDU, hx gastric bypass, recent PNA Smoker, 35 pack years.  Homeless  Home meds: norvasc, azithro, cefdinir, iron, hydralazine 50 bid, hydroxyzine prn anxiety, zofran prn, protonix 40mg  daily, B12. (Maybe lisinopril 10?- per note but not on home med list)  Significant Hospital Events: Including procedures, antibiotic start and stop dates in addition to other pertinent events     Interim History / Subjective:    Objective   Blood pressure (!) 226/134, pulse (!) 106, temperature 97.9 F (36.6 C), resp. rate (!) 31, height 5\' 9"  (1.753 m), weight 54 kg, SpO2 97 %.       No intake or output data in the 24 hours ending 07/08/21 0236 Filed Weights   07/07/21 2146  Weight: 54 kg     Examination: General:  expressing some discomfort, awake and alert  HENT: NCAT  Lungs: CTAB  Cardiovascular: RRR  Abdomen: nd  Extremities:  Neuro:  GU:   Resolved Hospital Problem list    Assessment & Plan:  SOB/chest pain: being treated for Pneumonia.  Added urine legionella and urin strep ag.   Hypokalemia; started on KCL Hypertension: BP actually improved at the time of my exam since 2 am.  Suspect due to cocaine metabolizing, antiHTN and antianxiety meds taking effect.  No needs for ICU at this time.   Please don't hesitate to call back if need arises again.   Labs   CBC: Recent Labs  Lab 07/03/21 1852 07/07/21 1720  WBC 17.6* 13.7*  NEUTROABS 14.4*  --   HGB 10.2* 9.4*  HCT 30.2* 28.5*  MCV 70.6* 70.4*  PLT 443* 426*    Basic Metabolic Panel: Recent Labs  Lab 07/03/21 1852 07/07/21 1720  NA 129* 131*  K 3.7 2.8*  CL 94* 98  CO2 25 23  GLUCOSE 70 76  BUN 23* 11  CREATININE 1.10* 0.71  CALCIUM 8.9 8.2*   GFR: Estimated Creatinine Clearance: 74.1 mL/min (by C-G formula based on SCr of 0.71 mg/dL). Recent Labs  Lab 07/03/21 1852 07/03/21 2116 07/07/21 1720  WBC 17.6*  --  13.7*  LATICACIDVEN  --  0.9  --     Liver Function Tests: Recent Labs  Lab 07/03/21 1852  AST 15  ALT 10  ALKPHOS 91  BILITOT 0.6  PROT 6.4*  ALBUMIN 2.8*  No results for input(s): LIPASE, AMYLASE in the last 168 hours. No results for input(s): AMMONIA in the last 168 hours.  ABG    Component Value Date/Time   HCO3 25.7 04/13/2021 0224   ACIDBASEDEF 2.1 (H) 04/13/2021 0224   O2SAT 85.6 04/13/2021 0224     Coagulation Profile: Recent Labs  Lab 07/03/21 2116  INR 1.1    Cardiac Enzymes: No results for input(s): CKTOTAL, CKMB, CKMBINDEX, TROPONINI in the last 168 hours.  HbA1C: Hgb A1c MFr Bld  Date/Time Value Ref Range Status  02/03/2019 05:15 AM 5.2 4.8 - 5.6 % Final    Comment:    (NOTE)         Prediabetes: 5.7 - 6.4         Diabetes: >6.4          Glycemic control for adults with diabetes: <7.0     CBG: No results for input(s): GLUCAP in the last 168 hours.  Review of Systems:     Past Medical History:  She,  has a past medical history of Arthritis, Chronic back pain, DVT (deep venous thrombosis) ([place]), GIB (gastrointestinal bleeding) (09/13/2017), History of blood transfusion, Hypertension, Migraine headache, Mood disorder ([place]), Neuropathy, Pneumonia, Positive PPD, Sciatica, and Stress incontinence.   Surgical History:   Past Surgical History:  Procedure Laterality Date   ALVEOLOPLASTY Bilateral 01/31/2018   Procedure: ALVEOLOPLASTY;  Surgeon: Diona Browner, DDS;  Location: [facility];  Service: Oral Surgery;  Laterality: Bilateral;   ANKLE SURGERY Left 1992   with hardware   BACK SURGERY     BIOPSY  02/08/2019   Procedure: BIOPSY;  Surgeon: Rush Landmark Telford Nab., MD;  Location: [facility];  Service: Gastroenterology;;   CESAREAN SECTION     CHOLECYSTECTOMY N/A 02/15/2017   Procedure: LAPAROSCOPIC CHOLECYSTECTOMY;  Surgeon: Clovis Riley, MD;  Location: [facility];  Service: General;  Laterality: N/A;   ENTEROSCOPY N/A 02/08/2019   Procedure: ENTEROSCOPY;  Surgeon: Rush Landmark Telford Nab., MD;  Location[facility];  Service: Gastroenterology;  Laterality: N/A;   ESOPHAGOGASTRODUODENOSCOPY N/A 01/17/2021   Procedure: ESOPHAGOGASTRODUODENOSCOPY (EGD);  Surgeon: Juanita Craver, MD;  Location: [facility];  Service: Endoscopy;  Laterality: N/A;   ESOPHAGOGASTRODUODENOSCOPY (EGD) WITH PROPOFOL N/A 09/15/2017   Procedure: ESOPHAGOGASTRODUODENOSCOPY (EGD) WITH PROPOFOL;  Surgeon: Clarene Essex, MD;  Location: [facility];  Service: Endoscopy;  Laterality: N/A;   ESOPHAGOGASTRODUODENOSCOPY (EGD) WITH PROPOFOL N/A 02/08/2019   Procedure: ESOPHAGOGASTRODUODENOSCOPY (EGD) WITH PROPOFOL;  Surgeon: Rush Landmark Telford Nab., MD;  Location: [facility];  Service: Gastroenterology;  Laterality: N/A;   GASTRIC BYPASS     INCISION AND  DRAINAGE OF PERITONSILLAR ABCESS Left 08/13/2017   Procedure: INCISION AND DRAINAGE OF LEFT NECK ABSCESS;  Surgeon: Melida Quitter, MD;  Location: [facility];  Service: ENT;  Laterality: Left;   LAPAROSCOPIC LYSIS OF ADHESIONS  02/15/2017   Procedure: LAPAROSCOPIC LYSIS OF ADHESIONS;  Surgeon: Clovis Riley, MD;  Location: [facility];  Service: General;;   LUMBAR DISC SURGERY     LUMBAR FUSION     SAVORY DILATION N/A 02/08/2019   Procedure: SAVORY DILATION;  Surgeon: Irving Copas., MD;  Location: [facility];  Service: Gastroenterology;  Laterality: N/A;   TOOTH EXTRACTION Bilateral 01/31/2018   Procedure: DENTAL RESTORATION/EXTRACTIONS;  Surgeon: Diona Browner, DDS;  Location: [facility];  Service: Oral Surgery;  Laterality: Bilateral;   TUBAL LIGATION     UPPER GI ENDOSCOPY N/A 02/15/2017   Procedure: UPPER GI ENDOSCOPY;  Surgeon: Clovis Riley, MD;  Location: [facility];  Service: General;  Laterality: N/A;     Social History:   reports that she has been smoking cigarettes. She has a 29.00 pack-year smoking history. She has never used smokeless tobacco. She reports current drug use. Drugs: Cocaine and Heroin. She reports that she does not drink alcohol.   Family History:  Her family history includes Diabetes in her father and mother; Hypertension in her father and mother.   Allergies Allergies  Allergen Reactions   Nsaids Other (See Comments)    Stomach ulcer    Ketorolac Tromethamine Itching and Nausea And Vomiting     Home Medications  Prior to Admission medications   Medication Sig Start Date End Date Taking? Authorizing Provider  acetaminophen (TYLENOL) 500 MG tablet Take 2 tablets (1,000 mg total) by mouth every 8 (eight) hours as needed for mild pain. 05/29/21   Mariel Aloe, MD  amLODipine (NORVASC) 10 MG tablet Take 1 tablet (10 mg total) by mouth daily. 05/30/21 08/28/21  Jacqlyn Larsen, PA-C  azithromycin (ZITHROMAX) 500 MG tablet Take 1 tablet (500 mg total) by mouth daily  for 5 days. Take 1 tablet daily for 5 days. 07/04/21 07/09/21  Eppie Gibson, MD  cefdinir (OMNICEF) 300 MG capsule Take 1 capsule (300 mg total) by mouth 2 (two) times daily for 7 days. 07/04/21 07/11/21  Eppie Gibson, MD  Ferrous Fumarate (IRON) 18 MG TBCR Take 1 tablet (18 mg total) by mouth 2 (two) times daily after a meal. 04/06/21   Domenic Polite, MD  hydrALAZINE (APRESOLINE) 50 MG tablet Take 1 tablet (50 mg total) by mouth 2 (two) times daily. 05/30/21 08/28/21  Jacqlyn Larsen, PA-C  hydrOXYzine (ATARAX/VISTARIL) 25 MG tablet Take 1 tablet (25 mg total) by mouth every 8 (eight) hours as needed for anxiety. 04/06/21   Domenic Polite, MD  lisinopril (ZESTRIL) 10 MG tablet Take 1 tablet (10 mg total) by mouth daily. 05/30/21 08/28/21  Jacqlyn Larsen, PA-C  nicotine (NICODERM CQ - DOSED IN MG/24 HOURS) 21 mg/24hr patch Place 1 patch (21 mg total) onto the skin daily. 04/07/21   Domenic Polite, MD  ondansetron (ZOFRAN-ODT) 4 MG disintegrating tablet Take 1 tablet (4 mg total) by mouth every 8 (eight) hours as needed for nausea or vomiting. 04/21/21   Tedd Sias, PA  pantoprazole (PROTONIX) 40 MG tablet Take 1 tablet (40 mg total) by mouth daily. 03/18/21   Danford, Suann Larry, MD  vitamin B-12 (CYANOCOBALAMIN) 1000 MCG tablet Take 1 tablet (1,000 mcg total) by mouth daily. Patient not taking: Reported on 07/04/2021 04/06/21   Domenic Polite, MD     Critical care time: 35 min

## 2021-07-08 NOTE — ED Notes (Signed)
Admitting team made aware of patient refusing CT, stating " Not until I get my pain medicine"

## 2021-07-08 NOTE — ED Notes (Signed)
Pt continuously asking for more pain medications and something to relax her. PRN given for anxiety with no relief admitting messaged.

## 2021-07-08 NOTE — ED Notes (Signed)
Patient returned from bathroom, and cigarette smell was apparent. Informed patient the dangers to self, staff and the whole hospital as well as violations of smoking in the facility. Patient stated the bathroom was that way when she utilized it. Security notified and a lighter was taken from the patient.

## 2021-07-08 NOTE — ED Notes (Addendum)
Patient opened the door, educated patient on precautions. Patient continued to refused meds / labs / CT. C/O pain and refused tylenol and patch offered, stated "It doesn't work for me". Attempted to engage patient in care being offered, patient continued to refuse and stated "This conversation is over". Patient laying in bed, able to finish sentences with no apparent distress noted. Bed at lowest position, call light in reach.

## 2021-07-08 NOTE — Progress Notes (Addendum)
Family Medicine Teaching Service Daily Progress Note Intern Pager: [phone]  Patient name: Terri [name] Medical record number: [id number] Date of birth: 11/14/1975 Age: 46 y.o. Gender: female  Primary Care Provider: Pcp, No Consultants: PCCM Code Status: FULL  Pt Overview and Major Events to Date:  07/08/21: Admitted to FPTS   Assessment and Plan:  Terri [name] is a 46 y.o. female presenting with worsening shortness of breath and chest pain. PMH is significant for HTN, h/o DVT, E. Coli bacteremia, polysubstance use disorder, IDA, bipolar disorder/anxiety/depression, h/o drug overdose, vitamin B12 deficiency, h/o gastric bypass.   Chest Pain   SOB   Multifocal PNA   Presumed COPD Maintaining O2 on RA. Patient is tachycardic and has tachypnea this AM, so d-dimer was obtained. CTA PE to be obtained if d-dimer is elevated. Leukocytosis this AM to 20.9 in the setting of recent steroid use with suspicion for multifocal PNA. Continuing vancomycin until MRSA PCR results. High risk for TB (housing insecurity, drug use, weight loss, and previous +PPD). Needs formal PFTs in outpatient setting with likely COPD. Hyponatremia, urine legionella added in addition to urine strep (found to be urine strep +) per CCM. Patient declined labwork this AM. Consider Toradol for pain control, normal kidney function, avoid opioids.  -Follow up MRSA PCR and TB test  -Urine legionella added  -Cefepime and Vancomycin >de-escalate when able  -PT/OT eval and treat -IS  -d-dimer result  -CTA PE after d-dimer results   HTN Patient presented with elevated Bps in 200s/100s ON. CCM consulted given lack of response to IV hydral and clonidine in the ED in setting of recent cocaine use. Current BP: 160s/100s-110s. Pt is symptomatic with vision changes and HA, CT head added to rule out intracranial bleed. Avoiding beta-blockers due to recent cocaine usage. TSH ordered as well. Needs echo when stable.  -Continue home Hydralazine  50 BID, Amlodipine 10mg  daily, and Lisinopril 10 mg daily  -Cardiac monitoring  -Consider nitro drip as option if needed  -Monitor for end organ damage    Elevated Troponin Trops trended flat 20>19 and EKG with no ischemic changes. Likely demand in setting of HTN urgency.   Hypokalemia  K 2.8, with 40 mEq in ED and 40 more mEq given by FPTS team increased to 3.7.  -Replete as needed   Microcytic Anemia  GERD   PUD (2020) Vitamin B12 Deficiency   H/o of gastric bypass  Hgb 9.4>11, MCV 69.2. Baseline Hgb around 10-11.  Marked epigastric tenderness today and from last admission Saturday. No history of colonoscopy. Iron and TIBC wnl, ferritin 34, vitamin B12 278, folate 8.1. Holding IV iron in current infection.  -Protonix 40mg  daily  -Home oral iron   Polysubstance Abuse Patient has frequent cocaine and heroin use, last use on 2/12. Denies EToH. Reports that she would like to continue with Suboxone therapy.  -Continue Suboxone  -COWs -Hep B/C, HIV -TOC consult   Tobacco Use  1 pack/day for 35 years.  -Nicotine patch   History of DVT Occurred in 2012, not on anticoagulation. Tachycardic with tachypnea, will proceed with CTA PE after d-dimer if positive.   Low BMI   Protein Calorie Malnutrition  BMI 17.5. Ensure supplementation.   Bipolar Disorder   Anxiety   Depression  Home medication: Hydroxyzine 25 mg q8hr PRN for anxiety. Received a dose of Ativan overnight.  -Continue Hydroxyzine 25 mg q8hr PRN   Housing Insecurity  -TOC consulted   FEN/GI: Heart Healthy, Ensure supplement  PPx:  Lovenox  Dispo: Pending further medical work up   Subjective:  Pt notes of chest pain and back pain that "Tylenol won't touch this pain." Still endorses epigastric pain. Amenable to Suboxone.   Objective: Temp:  [97.7 F (36.5 C)-98.6 F (37 C)] 97.9 F (36.6 C) (02/14 2019) Pulse Rate:  [72-124] 111 (02/15 0700) Resp:  [12-34] 31 (02/15 0700) BP: (158-226)/(99-146) 159/107 (02/15  0700) SpO2:  [93 %-100 %] 96 % (02/15 0700) Weight:  [54 kg] 54 kg (02/14 2146) Physical Exam: General: NAD, frail, nontoxic appearing women  Cardiovascular: RRR, no m/g/r Respiratory: No evidence of focal diminishment, no wheezes or stridor, good aeration throughout  Abdomen: Tenderness over epigastrium, nondistended, normoactive bs  Extremities: FROM, DP pulses palpated  Psych: Agitated behavior   Laboratory: Recent Labs  Lab 07/03/21 1852 07/07/21 1720 07/08/21 0416  WBC 17.6* 13.7* 20.9*  HGB 10.2* 9.4* 11.0*  HCT 30.2* 28.5* 32.2*  PLT 443* 426* 485*   Recent Labs  Lab 07/03/21 1852 07/07/21 1720 07/08/21 0416  NA 129* 131* 134*  K 3.7 2.8* 3.7  CL 94* 98 101  CO2 25 23 22   BUN 23* 11 9  CREATININE 1.10* 0.71 0.74  CALCIUM 8.9 8.2* 8.5*  PROT 6.4*  --  6.4*  BILITOT 0.6  --  0.8  ALKPHOS 91  --  83  ALT 10  --  11  AST 15  --  19  GLUCOSE 70 76 112*   DG Chest 2 View  Addendum Date: 07/07/2021   ADDENDUM REPORT: 07/07/2021 18:13 ADDENDUM: Please note, the first line under the FINDINGS section should read: Portions of the posterior costophrenic angles are excluded from the field of view on the lateral view radiograph, bilaterally. Electronically Signed   By: Kellie Simmering D.O.   On: 07/07/2021 18:13   Result Date: 07/07/2021 CLINICAL DATA:  Provided history: Chest pain. Additional history provided: Left-sided chest pain and shortness of breath for 2 weeks. History of hypertension, pneumonia, DVT. EXAM: CHEST - 2 VIEW COMPARISON:  Prior chest radiographs 07/03/2021 and earlier. FINDINGS: Portions of the posterior costophrenic angles are excluded from the field of view, bilaterally. Heart size within normal limits. Hyperinflated lungs. Persistent ill-defined opacities within the bilateral lung bases. A trace right pleural effusion is questioned. No evidence of pneumothorax. No acute bony abnormality identified. Degenerative changes of the spine. IMPRESSION: Persistent  ill-defined opacities within the bilateral lung bases, suspicious for multifocal pneumonia. Radiographic follow-up to complete resolution recommended. Hyperinflated lungs suggesting COPD. Possible trace right pleural effusion. Electronically Signed: By: Kellie Simmering D.O. On: 07/07/2021 18:05     Erskine Emery, MD 07/08/2021, 11:05 AM PGY-1, [place] Intern pager: [phone], text pages welcome

## 2021-07-08 NOTE — ED Notes (Signed)
Patient in room yelling, c/o temperature and stating she is hungry. Attempted to re-orient patient, patient continued to yell at staff. Re-directed patient that call light is in reach should assistance be needed instead of yelling.

## 2021-07-08 NOTE — Progress Notes (Signed)
Called PCCM regarding concern for hypertensive emergency given BP not controlled with oral and Hydralazine IV.  On call physician will evaluation for admission to the ICU.   Lyndee Hensen, DO

## 2021-07-08 NOTE — ED Notes (Signed)
Patient refused blood work; MD at bedside made aware.

## 2021-07-08 NOTE — Discharge Summary (Signed)
[facility] Summary  Patient name: Terri [name] record number: [id number] Date of birth: 24-Mar-1976 Age: 46 y.o. Gender: female Date of Admission: 07/07/2021  Left AMA: 07/08/21 Admitting Physician: Terri Hensen, DO  Primary Care Provider: Pcp, No Consultants: None   Indication for Hospitalization: SOB, cough, chest pain   Discharge Diagnoses/Problem List:  Active Problems:   Depression   Anxiety   Opioid abuse with opioid-induced mood disorder ([place])   Essential hypertension   Community acquired pneumonia   Homelessness  Disposition: Patient left AMA  Discharge Exam: Blood pressure (!) 159/107, pulse (!) 111, temperature 97.9 F (36.6 C), resp. rate (!) 31, height 5\' 9"  (1.753 m), weight 54 kg, SpO2 96 %. Physical Exam: General: NAD, frail, nontoxic appearing women  Cardiovascular: RRR, no m/g/r Respiratory: No evidence of focal diminishment, no wheezes or stridor, good aeration throughout  Abdomen: Tenderness over epigastrium, nondistended, normoactive bs  Extremities: FROM, DP pulses palpated  Psych: Agitated behavior   Brief Hospital Course:  Terri [name] is a 46 y.o. female presenting with worsening shortness of breath and chest pain. PMH is significant for HTN, h/o DVT, E. Coli bacteremia, polysubstance use disorder, IDA, bipolar disorder/anxiety/depression, h/o drug overdose, vitamin B12 deficiency, h/o gastric bypass. Hospital course is outlined below:   Chest pain   Shortness of Breath   Multifocal PNA   Presumed COPD Patient reports ongoing shortness of breath, chest pain, fever/chills. Recently admitted for the same, left AMA on 2/11. Returns today after she was seen at [facility] for substance abuse help and was directed to the ED. Vitals on admission remarkable for tachycardia to 102, and hypertension to 208/130. Maintaining appropriate O2 sats on room air.  Admission labs remarkable for WBC 13.7, Hgb 9.4, K 2.8. CXR with persistent  ill-defined opacities within the bilateral lung bases, suspicious for multifocal pneumonia. She was started on Cefepime and vancomycin for broad spectrum coverage with strep PNA + urine. Consider de-escalating to Augmentin, but we were unable to accomplish this prior to patient leaving AMA. Unable to discuss with patient as she left prior to the FPTS being notified via Epic chat. Patient declined labs, but we were anticipating obtaining d-dimer, MRSA PCR, TB testing, Hep B/C, HIV.   HTN  Home regimen is amlodipine 10mg  daily, hydralazine 50mg  BID, and lisinopril 10mg  daily, and these were continued.  Microcytic Anemia GERD   Peptic Ulcer Disease (2020) Vitamin B12 Deficiency   History of Gastric Bypass Anemia panel showed ferritin of 34, gave patient home iron supplementation.   Polysubstance Abuse Patient with frequent cocaine and heroin use.  Denies EtOH use.  Last use of both cocaine and heroin was 2/12. Started Suboxone therapy prior to leaving AMA.    Follow Up Recommendations: Pt needs abx (Augmentin) for strep pna  Follow up IDA, needs colonscopy likely, possibly thalassemia related  Needs repeat CBC and BMP outpt  Can continue Suboxone treatment if interested     Significant Procedures: None   Significant Labs and Imaging:  Recent Labs  Lab 07/03/21 1852 07/07/21 1720 07/08/21 0416  WBC 17.6* 13.7* 20.9*  HGB 10.2* 9.4* 11.0*  HCT 30.2* 28.5* 32.2*  PLT 443* 426* 485*   Recent Labs  Lab 07/03/21 1852 07/07/21 1720 07/08/21 0416  NA 129* 131* 134*  K 3.7 2.8* 3.7  CL 94* 98 101  CO2 25 23 22   GLUCOSE 70 76 112*  BUN 23* 11 9  CREATININE 1.10* 0.71 0.74  CALCIUM 8.9 8.2*  8.5*  ALKPHOS 91  --  83  AST 15  --  19  ALT 10  --  11  ALBUMIN 2.8*  --  2.5*     DG Chest 2 View  Addendum Date: 07/07/2021   ADDENDUM REPORT: 07/07/2021 18:13 ADDENDUM: Please note, the first line under the FINDINGS section should read: Portions of the posterior costophrenic angles  are excluded from the field of view on the lateral view radiograph, bilaterally. Electronically Signed   By: Terri [name] D.O.   On: 07/07/2021 18:13   Result Date: 07/07/2021 CLINICAL DATA:  Provided history: Chest pain. Additional history provided: Left-sided chest pain and shortness of breath for 2 weeks. History of hypertension, pneumonia, DVT. EXAM: CHEST - 2 VIEW COMPARISON:  Prior chest radiographs 07/03/2021 and earlier. FINDINGS: Portions of the posterior costophrenic angles are excluded from the field of view, bilaterally. Heart size within normal limits. Hyperinflated lungs. Persistent ill-defined opacities within the bilateral lung bases. A trace right pleural effusion is questioned. No evidence of pneumothorax. No acute bony abnormality identified. Degenerative changes of the spine. IMPRESSION: Persistent ill-defined opacities within the bilateral lung bases, suspicious for multifocal pneumonia. Radiographic follow-up to complete resolution recommended. Hyperinflated lungs suggesting COPD. Possible trace right pleural effusion. Electronically Signed: By: Terri [name] D.O. On: 07/07/2021 18:05    Results/Tests Pending at Time of Discharge:  Unresulted Labs (From admission, onward)     Start     Ordered   07/09/21 3151  Basic metabolic panel  Tomorrow morning,   R        07/08/21 0844   07/09/21 0500  CBC  Tomorrow morning,   R        07/08/21 0844   07/08/21 0716  Lipid panel  Once,   R        07/08/21 0715   07/08/21 0646  QuantiFERON-TB Gold Plus  Once,   R        07/08/21 0645   07/08/21 0640  TSH  Once,   R        07/08/21 0640   07/08/21 0634  D-dimer, quantitative  Once,   R        07/08/21 0633   07/08/21 0500  HCV Ab Reflex to Quant PCR  Tomorrow morning,   R        07/08/21 0032   07/08/21 0500  Hepatitis B surface antibody  Tomorrow morning,   R        07/08/21 0032   07/08/21 0334  Legionella Pneumophila Serogp 1 Ur Ag  Once,   R        07/08/21 0333   07/08/21 0022   MRSA Next Gen by PCR, Nasal  Once,   R        07/08/21 0023               Terri Emery, MD 07/08/2021, 5:40 PM PGY-1, [place]

## 2021-07-08 NOTE — ED Notes (Signed)
Patient offered tylenol for pain, stated "I'm good".

## 2021-07-08 NOTE — ED Notes (Signed)
Attempted to offer lidocaine and nicotine patch to patient again. COWS assessment except pupil assessment. Patient continued to ask for pain meds. Suboxone was refused by patient as well as updating vitals.

## 2021-07-08 NOTE — Progress Notes (Signed)
Alerted that patient had initially refused CT Head. At the time, patient was acutely anxious.  Given 1mg  Ativan.  Will reassess patient willingness to have scan at this time.   Pearla Dubonnet, MD

## 2021-07-08 NOTE — Hospital Course (Addendum)
Terri [name] is a 46 y.o. female presenting with worsening shortness of breath and chest pain. PMH is significant for HTN, h/o DVT, E. Coli bacteremia, polysubstance use disorder, IDA, bipolar disorder/anxiety/depression, h/o drug overdose, vitamin B12 deficiency, h/o gastric bypass. Hospital course is outlined below:   Chest pain   Shortness of Breath   Multifocal PNA   Presumed COPD Patient reports ongoing shortness of breath, chest pain, fever/chills. Recently admitted for the same, left AMA on 2/11. Returns today after she was seen at [facility] for substance abuse help and was directed to the ED. Vitals on admission remarkable for tachycardia to 102, and hypertension to 208/130. Maintaining appropriate O2 sats on room air.  Admission labs remarkable for WBC 13.7, Hgb 9.4, K 2.8. CXR with persistent ill-defined opacities within the bilateral lung bases, suspicious for multifocal pneumonia. She was started on Cefepime and vancomycin for broad spectrum coverage with strep PNA + urine. Consider de-escalating to Augmentin, but we were unable to accomplish this prior to patient leaving AMA. Unable to discuss with patient as she left prior to the FPTS being notified via Epic chat. Patient declined labs, but we were anticipating obtaining d-dimer, MRSA PCR, TB testing, Hep B/C, HIV.   HTN  Home regimen is amlodipine 10mg  daily, hydralazine 50mg  BID, and lisinopril 10mg  daily, and these were continued.  Microcytic Anemia GERD   Peptic Ulcer Disease (2020) Vitamin B12 Deficiency   History of Gastric Bypass Anemia panel showed ferritin of 34, gave patient home iron supplementation.   Polysubstance Abuse Patient with frequent cocaine and heroin use.  Denies EtOH use.  Last use of both cocaine and heroin was 2/12. Started Suboxone therapy prior to leaving AMA.    Follow Up Recommendations: Pt needs abx (Augmentin) for strep pna  Follow up IDA, needs colonscopy likely, possibly thalassemia related  Needs  repeat CBC and BMP outpt  Can continue Suboxone treatment if interested

## 2021-07-09 LAB — HCV AB W REFLEX TO QUANT PCR: HCV Ab: NONREACTIVE

## 2021-07-09 LAB — LEGIONELLA PNEUMOPHILA SEROGP 1 UR AG: L. pneumophila Serogp 1 Ur Ag: NEGATIVE

## 2021-07-09 LAB — HCV INTERPRETATION

## 2021-07-09 LAB — HEPATITIS B SURFACE ANTIBODY, QUANTITATIVE: Hep B S AB Quant (Post): <3.1 m[IU]/mL — ABNORMAL LOW (ref >9.9)

## 2021-08-19 ENCOUNTER — Emergency Department (HOSPITAL_COMMUNITY)
Admission: EM | Admit: 2021-08-19 | Discharge: 2021-08-19 | Disposition: A | Discharge status: Home / Self Care | Payer: Medicaid Other | Attending: Emergency Medicine | Admitting: Emergency Medicine

## 2021-08-19 ENCOUNTER — Other Ambulatory Visit: Payer: Self-pay

## 2021-08-19 ENCOUNTER — Encounter (HOSPITAL_COMMUNITY): Payer: Self-pay

## 2021-08-19 ENCOUNTER — Emergency Department (HOSPITAL_COMMUNITY): Payer: Medicaid Other

## 2021-08-19 DIAGNOSIS — I110 Hypertensive heart disease with heart failure: Secondary | ICD-10-CM | POA: Diagnosis not present

## 2021-08-19 DIAGNOSIS — R7989 Other specified abnormal findings of blood chemistry: Secondary | ICD-10-CM | POA: Diagnosis not present

## 2021-08-19 DIAGNOSIS — Z79899 Other long term (current) drug therapy: Secondary | ICD-10-CM | POA: Insufficient documentation

## 2021-08-19 DIAGNOSIS — I509 Heart failure, unspecified: Secondary | ICD-10-CM | POA: Diagnosis not present

## 2021-08-19 DIAGNOSIS — R682 Dry mouth, unspecified: Secondary | ICD-10-CM

## 2021-08-19 DIAGNOSIS — R059 Cough, unspecified: Secondary | ICD-10-CM | POA: Diagnosis not present

## 2021-08-19 LAB — CBC WITH DIFFERENTIAL/PLATELET
Abs Immature Granulocytes: 0.08 10*3/uL — ABNORMAL HIGH (ref 0.00–0.07)
Basophils Absolute: 0 10*3/uL (ref 0.0–0.1)
Basophils Relative: 0 %
Eosinophils Absolute: 0 10*3/uL (ref 0.0–0.5)
Eosinophils Relative: 0 %
HCT: 25.3 % — ABNORMAL LOW (ref 36.0–46.0)
Hemoglobin: 8.5 g/dL — ABNORMAL LOW (ref 12.0–15.0)
Immature Granulocytes: 1 %
Lymphocytes Relative: 10 %
Lymphs Abs: 0.8 10*3/uL (ref 0.7–4.0)
MCH: 21.2 pg — ABNORMAL LOW (ref 26.0–34.0)
MCHC: 33.6 g/dL (ref 30.0–36.0)
MCV: 63.1 fL — ABNORMAL LOW (ref 80.0–100.0)
Monocytes Absolute: 0.4 10*3/uL (ref 0.1–1.0)
Monocytes Relative: 5 %
Neutro Abs: 6.3 10*3/uL (ref 1.7–7.7)
Neutrophils Relative %: 84 %
Platelets: ADEQUATE 10*3/uL (ref 150–400)
RBC: 4.01 MIL/uL (ref 3.87–5.11)
RDW: 19 % — ABNORMAL HIGH (ref 11.5–15.5)
WBC: 7.6 10*3/uL (ref 4.0–10.5)
nRBC: 0.3 % — ABNORMAL HIGH (ref 0.0–0.2)

## 2021-08-19 LAB — BASIC METABOLIC PANEL
Anion gap: 9 (ref 5–15)
BUN: 23 mg/dL — ABNORMAL HIGH (ref 6–20)
CO2: 28 mmol/L (ref 22–32)
Calcium: 8.2 mg/dL — ABNORMAL LOW (ref 8.9–10.3)
Chloride: 95 mmol/L — ABNORMAL LOW (ref 98–111)
Creatinine, Ser: 0.89 mg/dL (ref 0.44–1.00)
GFR, Estimated: >60 mL/min (ref >60)
Glucose, Bld: 67 mg/dL — ABNORMAL LOW (ref 70–99)
Potassium: 3 mmol/L — ABNORMAL LOW (ref 3.5–5.1)
Sodium: 132 mmol/L — ABNORMAL LOW (ref 135–145)

## 2021-08-19 LAB — BRAIN NATRIURETIC PEPTIDE: B Natriuretic Peptide: 144.1 pg/mL — ABNORMAL HIGH (ref 0.0–100.0)

## 2021-08-19 MED ORDER — SODIUM CHLORIDE 0.9 % IV BOLUS
1000.0000 mL | Freq: Once | INTRAVENOUS | Status: AC
  Administered 2021-08-19: 1000 mL via INTRAVENOUS

## 2021-08-19 MED ORDER — POTASSIUM CHLORIDE CRYS ER 20 MEQ PO TBCR
40.0000 meq | EXTENDED_RELEASE_TABLET | Freq: Once | ORAL | Status: AC
  Administered 2021-08-19: 40 meq via ORAL
  Filled 2021-08-19: qty 2

## 2021-08-19 NOTE — ED Triage Notes (Signed)
Pt presents to ED with complaint of flu like symptoms ongoing for a few days. Pt endorses SOB, aches and chills. Pt displaying manic behavior upon triage, concern for drug use.  ?

## 2021-08-19 NOTE — Discharge Instructions (Signed)
You were seen today for cough and dry mouth. Your lab work shows no sign of infection and x-ray shows no pneumonia. I recommend establishing a primary care provider to discuss anemia and dry mouth. Return to the emergency department if you develop life threatening symptoms such as chest pain or shortness of breath ?

## 2021-08-19 NOTE — ED Provider Notes (Signed)
?[facility] DEPT ?Provider Note ? ? ?CSN: [id number] ?Arrival date & time: 08/19/21  2019 ? ?  ? ?History ? ?Chief Complaint  ?Patient presents with  ? Cough  ? ? ?Terri [name] is a 46 y.o. female.  Patient presents to the emergency department complaining of cough and dry mouth.  The patient states that she has had a cough intermittently for the past 2 months since she was diagnosed with pneumonia.  She denies shortness of breath.  She denies chest pain.  Denies fever.  The patient is also concerned about dry mouth.  Denies dry eye.  The patient has past medical history significant for opioid abuse, depression, anxiety, hypertension, history of sepsis, thrombocytosis, hypokalemia, hypomagnesemia, peptic ulcer disease, amphetamine abuse, cocaine abuse, heroin abuse, microcytic anemia, polysubstance abuse, mild protein malnutrition, iron deficiency anemia, hyponatremia, vitamin b12 deficiency,  ? ?HPI ? ?  ? ?Home Medications ?Prior to Admission medications   ?Medication Sig Start Date End Date Taking? Authorizing Provider  ?acetaminophen (TYLENOL) 500 MG tablet Take 2 tablets (1,000 mg total) by mouth every 8 (eight) hours as needed for mild pain. 05/29/21   Mariel Aloe, MD  ?amLODipine (NORVASC) 10 MG tablet Take 1 tablet (10 mg total) by mouth daily. 05/30/21 08/28/21  Jacqlyn Larsen, PA-C  ?Ferrous Fumarate (IRON) 18 MG TBCR Take 1 tablet (18 mg total) by mouth 2 (two) times daily after a meal. 04/06/21   Domenic Polite, MD  ?hydrALAZINE (APRESOLINE) 50 MG tablet Take 1 tablet (50 mg total) by mouth 2 (two) times daily. 05/30/21 08/28/21  Jacqlyn Larsen, PA-C  ?hydrOXYzine (ATARAX/VISTARIL) 25 MG tablet Take 1 tablet (25 mg total) by mouth every 8 (eight) hours as needed for anxiety. 04/06/21   Domenic Polite, MD  ?lisinopril (ZESTRIL) 10 MG tablet Take 1 tablet (10 mg total) by mouth daily. 05/30/21 08/28/21  Jacqlyn Larsen, PA-C  ?nicotine (NICODERM CQ - DOSED IN MG/24 HOURS) 21 mg/24hr  patch Place 1 patch (21 mg total) onto the skin daily. 04/07/21   Domenic Polite, MD  ?ondansetron (ZOFRAN-ODT) 4 MG disintegrating tablet Take 1 tablet (4 mg total) by mouth every 8 (eight) hours as needed for nausea or vomiting. 04/21/21   Tedd Sias, PA  ?pantoprazole (PROTONIX) 40 MG tablet Take 1 tablet (40 mg total) by mouth daily. 03/18/21   Danford, Suann , MD  ?vitamin B-12 (CYANOCOBALAMIN) 1000 MCG tablet Take 1 tablet (1,000 mcg total) by mouth daily. ?Patient not taking: Reported on 07/04/2021 04/06/21   Domenic Polite, MD  ?   ? ?Allergies    ?Nsaids and Ketorolac tromethamine   ? ?Review of Systems   ?Review of Systems ? ?Physical Exam ?Updated Vital Signs ?BP (!) 130/115   Pulse 92   Temp 98.2 ?F (36.8 ?C) (Oral)   Resp 16   Ht '5\' 9"'$  (1.753 m)   Wt 54 kg   LMP  (LMP Unknown)   SpO2 96%   BMI 17.58 kg/m?  ?Physical Exam ? ?ED Results / Procedures / Treatments   ?Labs ?(all labs ordered are listed, but only abnormal results are displayed) ?Labs Reviewed  ?BASIC METABOLIC PANEL - Abnormal; Notable for the following components:  ?    Result Value  ? Sodium 132 (*)   ? Potassium 3.0 (*)   ? Chloride 95 (*)   ? Glucose, Bld 67 (*)   ? BUN 23 (*)   ? Calcium 8.2 (*)   ? All other  components within normal limits  ?BRAIN NATRIURETIC PEPTIDE - Abnormal; Notable for the following components:  ? B Natriuretic Peptide 144.1 (*)   ? All other components within normal limits  ?CBC WITH DIFFERENTIAL/PLATELET - Abnormal; Notable for the following components:  ? Hemoglobin 8.5 (*)   ? HCT 25.3 (*)   ? MCV 63.1 (*)   ? MCH 21.2 (*)   ? RDW 19.0 (*)   ? nRBC 0.3 (*)   ? Abs Immature Granulocytes 0.08 (*)   ? All other components within normal limits  ? ? ?EKG ?EKG Interpretation ? ?Date/Time:  Wednesday August 19 2021 21:28:01 EDT ?Ventricular Rate:  94 ?PR Interval:  88 ?QRS Duration: 82 ?QT Interval:  337 ?QTC Calculation: 422 ?R Axis:   78 ?Text Interpretation: Sinus rhythm Short PR interval Left  ventricular hypertrophy Confirmed by Lajean Saver [phone]) on 08/19/2021 10:10:21 PM ? ?Radiology ?DG Chest 2 View ? ?Result Date: 08/19/2021 ?CLINICAL DATA:  Shortness of breath. EXAM: CHEST - 2 VIEW COMPARISON:  July 07, 2021 FINDINGS: The heart size and mediastinal contours are within normal limits. Both lungs are clear. The visualized skeletal structures are unremarkable. IMPRESSION: No active cardiopulmonary disease. Electronically Signed   By: Virgina Norfolk M.D.   On: 08/19/2021 21:18   ? ?Procedures ?Procedures  ? ? ?Medications Ordered in ED ?Medications  ?sodium chloride 0.9 % bolus 1,000 mL (0 mLs Intravenous Stopped 08/19/21 2310)  ?potassium chloride SA (KLOR-CON M) CR tablet 40 mEq (40 mEq Oral Given 08/19/21 2221)  ? ? ?ED Course/ Medical Decision Making/ A&P ?  ?                        ?Medical Decision Making ?Amount and/or Complexity of Data Reviewed ?Labs: ordered. ?Radiology: ordered. ? ?Risk ?Prescription drug management. ? ? ?The patient presented with a chief complaint of a cough.  Differential diagnosis includes pneumonia, respiratory infection, COVID, influenza, and others. ? ?The patient has no white count and no signs of acute disease on chest x-ray.  I see no signs of a recurrent pneumonia. ? ?The patient has a cough but no other signs of COVID or influenza such as chills, fever, body aches.  I see no utility in testing for these since the patient has had symptoms ongoing now for a month or more. ? ?I ordered and interpreted labs.  Pertinent results include hemoglobin 8.5, BNP 144, potassium 3. ? ?I ordered a bolus of saline and potassium for the patient to help with her hypokalemia. ? ?The patient's BNP is mildly elevated but the patient has noes clinical signs of CHF. ? ?The patient refused fecal occult blood testing and denies any symptoms of GI bleed.  The patient's anemia is likely diet and malnutrition based. ? ?There is no indication at this time for admission.  I see no reason  for further emergent work-up at this time.  The patient does have dry mouth which is notable on exam.  There is no need for emergent work-up of the dry mouth.  I did advise the patient that substance abuse was not helping her conditions. She states that she last used cocaine a few days ago. I offered help with resources for quitting but the patient declined. ? ?Discharge hom ? ? ?Final Clinical Impression(s) / ED Diagnoses ?Final diagnoses:  ?Dry mouth  ?Cough, unspecified type  ? ? ?Rx / DC Orders ?ED Discharge Orders   ? ? None  ? ?  ? ? ?  ?  Dorothyann Peng, PA-C ?08/19/21 2314 ? ?  ?Lajean Saver, MD ?08/20/21 4758 ? ?

## 2021-08-26 IMAGING — CT CT ABD-PELV W/ CM
2 of 5 series · 14 of 46 positions shown, 16 images · IV contrast (omnipaque)
Comparison: CT the abdomen and pelvis 02/08/2019.

CLINICAL DATA: 45-year-old female with history of abdominal pain
shooting up into her chest for 1 week.

EXAM:
CT ABDOMEN AND PELVIS WITH CONTRAST
TECHNIQUE: Multidetector CT imaging of the abdomen and pelvis was performed
using the standard protocol following bolus administration of
intravenous contrast.
CONTRAST:  80mL OMNIPAQUE IOHEXOL 350 MG/ML SOLN

[Series 2: axial st · axial · 0.82mm/px · z∈[+992,+1386]mm · 11 of 91 slices shown, 13 images]
[im 6/91  soft-tissue]
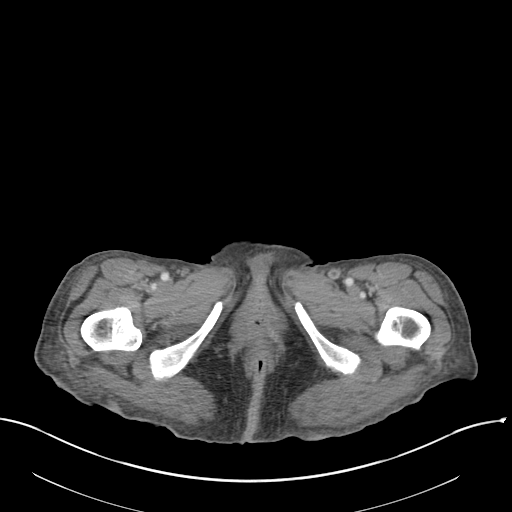
[im 6/91  bone]
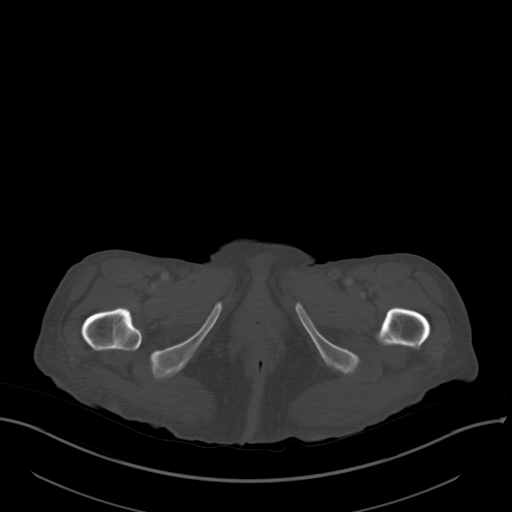
[im 17/91  soft-tissue]
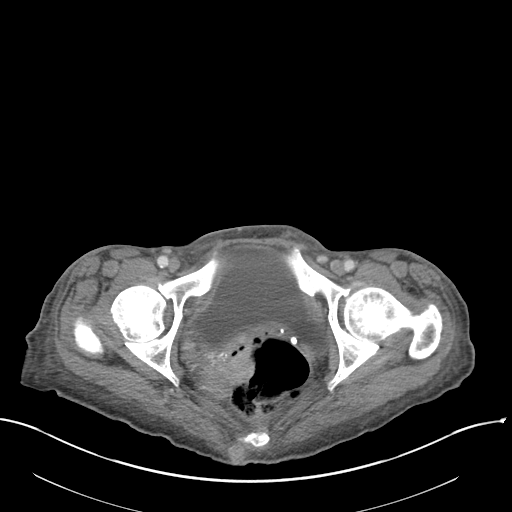
[im 23/91  soft-tissue]
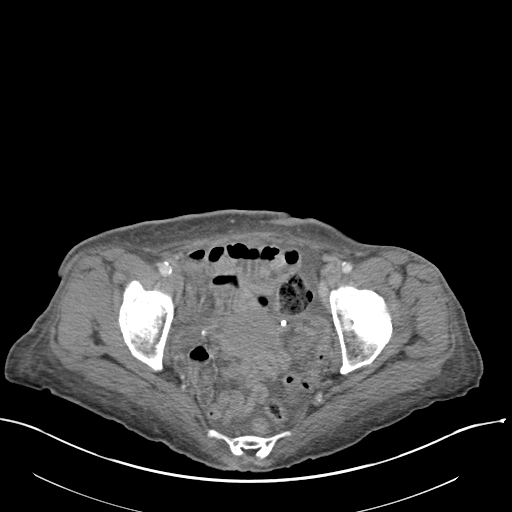
[im 29/91  soft-tissue]
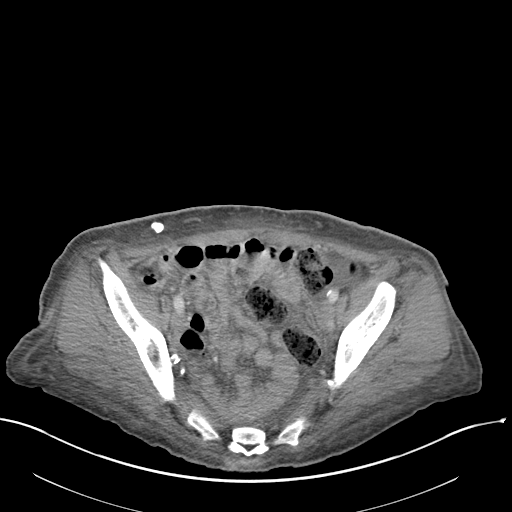
[im 40/91  soft-tissue]
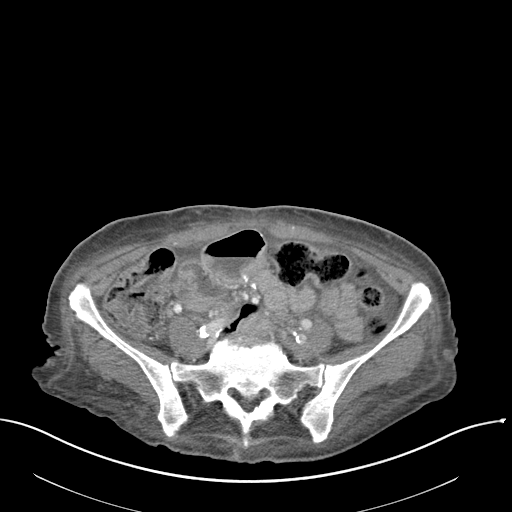
[im 46/91  soft-tissue]
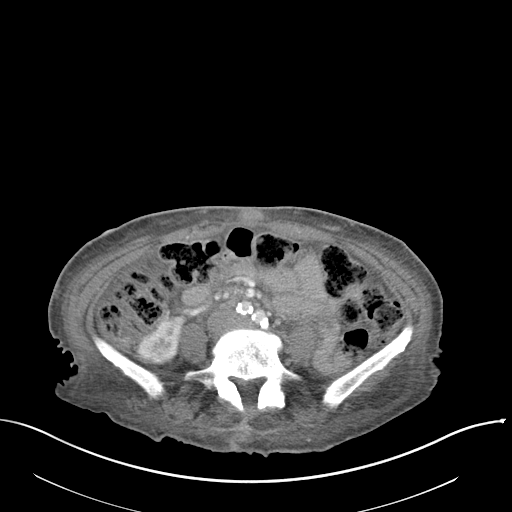
[im 51/91  soft-tissue]
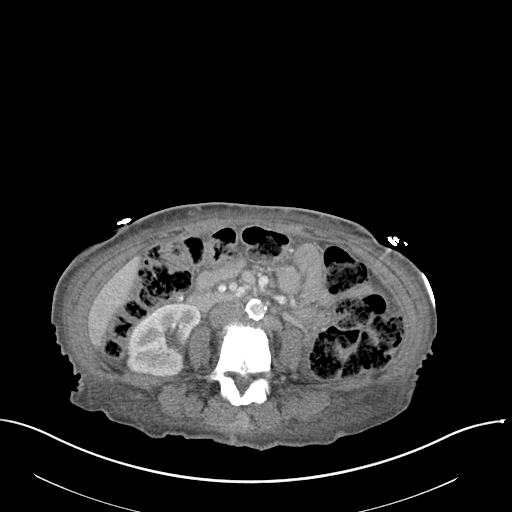
[im 62/91  soft-tissue]
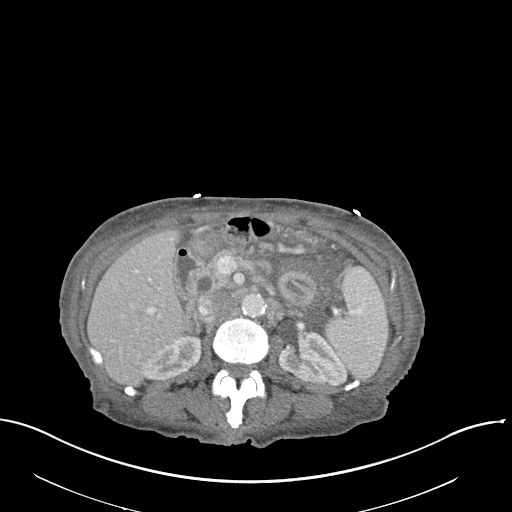
[im 68/91  soft-tissue]
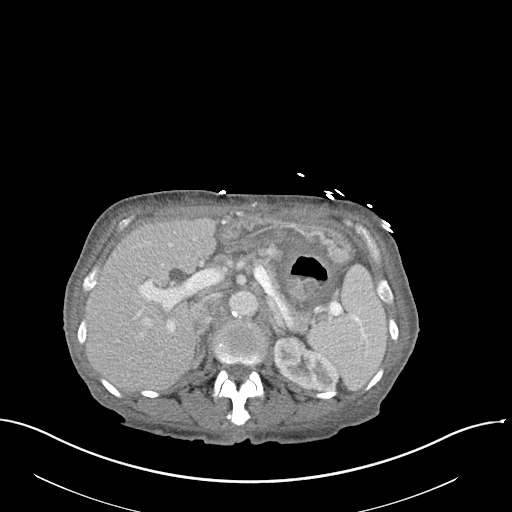
[im 68/91  bone]
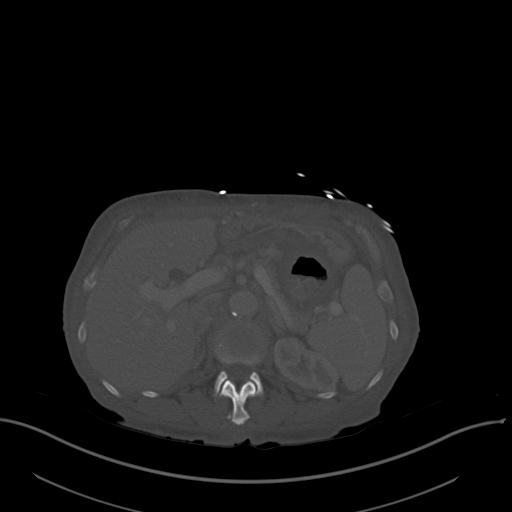
[im 74/91  soft-tissue]
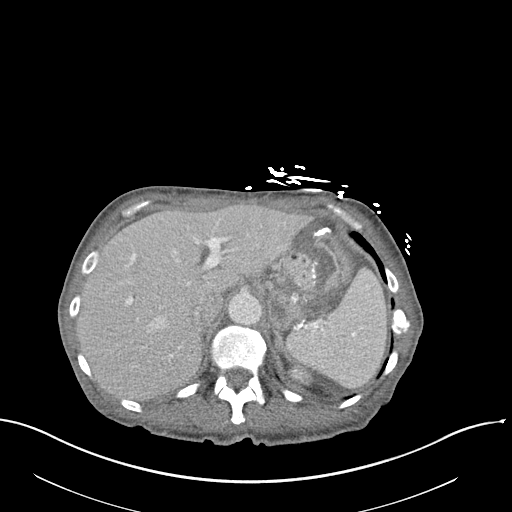
[im 85/91  soft-tissue]
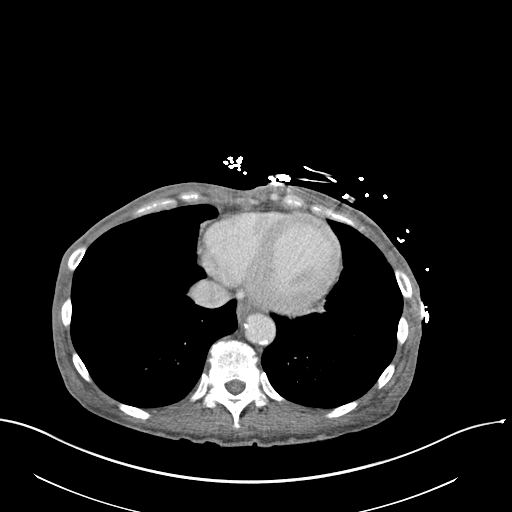

[Series 4: coronal st · coronal · 0.65mm/px · 3 of 121 slices shown]
[im 41/121  soft-tissue]
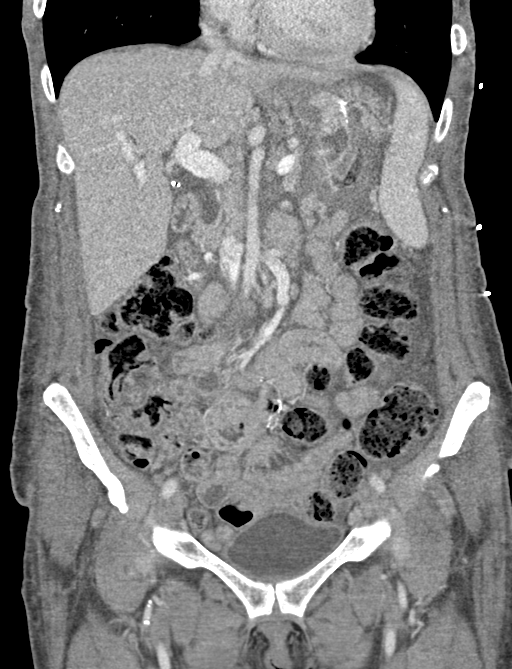
[im 54/121  soft-tissue]
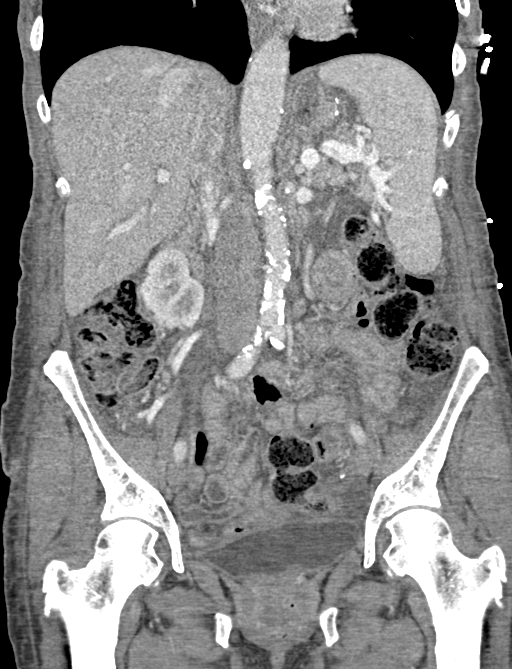
[im 67/121  soft-tissue]
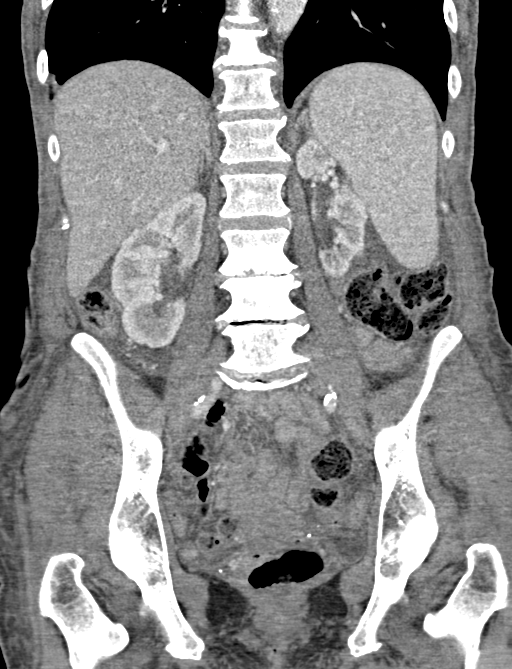

[14 of 46 positions shown; findings below may reference images not displayed]

FINDINGS: Lower chest: Scattered areas of mild cylindrical bronchiectasis with
some associated peribronchovascular ground-glass attenuation micro
nodularity and additional small pulmonary nodules scattered
throughout the visualized lung bases, largest of which measures 5 mm
in the left lower lobe (axial image 19 of series 6). Areas of
scarring and/or subsegmental atelectasis in the lower lobes of the
lungs bilaterally. Atherosclerotic calcifications in the thoracic
aorta.

Hepatobiliary: In the periphery of the right lobe of the liver
between segments 5 and 8 (axial image 21 of series 2) there is a
small subcapsular lesion measuring 1.2 x 0.5 cm, too small to
characterize, but similar to the prior study and statistically
likely to represent a cyst. No other larger more suspicious
appearing hepatic lesions are noted. Status post cholecystectomy.
Minimal intrahepatic biliary ductal dilatation and slight prominence
of the common bile duct which measures 1 cm in the porta hepatis
likely reflects benign post cholecystectomy physiology.

Pancreas: No pancreatic mass. No pancreatic ductal dilatation. No
pancreatic or peripancreatic fluid collections or inflammatory
changes.

Spleen: Spleen is mildly enlarged measuring 10.5 x 4.9 x 14.7 cm
(estimated splenic volume of 378 mL).

Adrenals/Urinary Tract: Extensive cortical thinning is noted in the
left kidney. Right kidney and bilateral adrenal glands are normal in
appearance. No hydroureteronephrosis. Small amount of gas non
dependently in the lumen of the urinary bladder. Urinary bladder is
otherwise normal in appearance.

Stomach/Bowel: Postoperative changes of Roux-en-Y gastric bypass.
Adjacent to the anastomosis there is a round structure in the left
upper quadrant of the abdomen (axial image 25 of series 2) which has
a very thickened hazy irregular wall, concerning for an ulcer. It is
uncertain whether this arises directly from the anastomotic suture
line or is within the adjacent portions of either the small bowel or
stomach. There is a large amount of surrounding inflammation, very
intimately associated with the proximal small bowel, well
appreciated anteriorly on axial image 28 of series 2. No
well-defined fluid collection is identified in this region. No
pathologic dilatation of small bowel or colon. Appendix is not
confidently identified may be surgically absent or atrophic.

Vascular/Lymphatic: Aortic atherosclerosis, without evidence of
aneurysm or dissection in the abdominal or pelvic vasculature. No
lymphadenopathy noted in the abdomen or pelvis.

Reproductive: Uterus and ovaries are a trophic.

Other: Mild diffuse mesenteric edema. Small volume of ascites. No
pneumoperitoneum.

Musculoskeletal: Mild diffuse body wall edema. Status post
laminectomy at L4-L5. Multilevel degenerative disc disease, most
severe at L3-L4 where there are advanced discogenic endplate
changes. There are no aggressive appearing lytic or blastic lesions
noted in the visualized portions of the skeleton.
IMPRESSION: 1. Findings are highly concerning for large perianastomotic ulcer in
this patient who is status post Roux-en-Y gastric bypass, as
detailed above.
2. Anasarca.
3. Compared to the prior examination there is extensive thinning of
the cortex of the left kidney, presumably scarring from prior
obstruction/infection.
4. Small amount of gas in the nondependent portion of the lumen of
the urinary bladder on today's examination. If there has been recent
catheterization for urinalysis, this is presumably iatrogenic. In
the absence of a recent history of catheterization, further
evaluation with urinalysis would be recommended to exclude the
possibility of urinary tract infection with gas-forming organisms.
5. Findings in the lung bases which could be seen in the setting of
chronic indolent atypical infectious process such as CASPER
(mycobacterium avium intracellulare). Nonemergent outpatient
referral to Pulmonology for further clinical evaluation is
suggested.
6. Mild splenomegaly.
7. Additional incidental findings, as above.

## 2021-08-27 ENCOUNTER — Emergency Department (HOSPITAL_COMMUNITY)
Admission: EM | Admit: 2021-08-27 | Discharge: 2021-08-27 | Disposition: A | Discharge status: Home / Self Care | Payer: Medicaid Other | Attending: Emergency Medicine | Admitting: Emergency Medicine

## 2021-08-27 ENCOUNTER — Other Ambulatory Visit: Payer: Self-pay

## 2021-08-27 ENCOUNTER — Encounter (HOSPITAL_COMMUNITY): Payer: Self-pay | Admitting: Emergency Medicine

## 2021-08-27 ENCOUNTER — Emergency Department (HOSPITAL_COMMUNITY): Payer: Medicaid Other

## 2021-08-27 DIAGNOSIS — L84 Corns and callosities: Secondary | ICD-10-CM | POA: Insufficient documentation

## 2021-08-27 DIAGNOSIS — R197 Diarrhea, unspecified: Secondary | ICD-10-CM | POA: Diagnosis not present

## 2021-08-27 DIAGNOSIS — M79672 Pain in left foot: Secondary | ICD-10-CM | POA: Diagnosis not present

## 2021-08-27 DIAGNOSIS — I10 Essential (primary) hypertension: Secondary | ICD-10-CM | POA: Diagnosis not present

## 2021-08-27 DIAGNOSIS — R710 Precipitous drop in hematocrit: Secondary | ICD-10-CM | POA: Diagnosis not present

## 2021-08-27 DIAGNOSIS — R509 Fever, unspecified: Secondary | ICD-10-CM | POA: Diagnosis not present

## 2021-08-27 DIAGNOSIS — R7401 Elevation of levels of liver transaminase levels: Secondary | ICD-10-CM | POA: Diagnosis not present

## 2021-08-27 DIAGNOSIS — M79671 Pain in right foot: Secondary | ICD-10-CM | POA: Insufficient documentation

## 2021-08-27 DIAGNOSIS — R1010 Upper abdominal pain, unspecified: Secondary | ICD-10-CM

## 2021-08-27 DIAGNOSIS — Z79899 Other long term (current) drug therapy: Secondary | ICD-10-CM | POA: Diagnosis not present

## 2021-08-27 DIAGNOSIS — R1011 Right upper quadrant pain: Secondary | ICD-10-CM | POA: Insufficient documentation

## 2021-08-27 LAB — CBC WITH DIFFERENTIAL/PLATELET
Abs Immature Granulocytes: 0.03 10*3/uL (ref 0.00–0.07)
Basophils Absolute: 0 10*3/uL (ref 0.0–0.1)
Basophils Relative: 0 %
Eosinophils Absolute: 0 10*3/uL (ref 0.0–0.5)
Eosinophils Relative: 0 %
HCT: 26.7 % — ABNORMAL LOW (ref 36.0–46.0)
Hemoglobin: 9.4 g/dL — ABNORMAL LOW (ref 12.0–15.0)
Immature Granulocytes: 0 %
Lymphocytes Relative: 11 %
Lymphs Abs: 0.9 10*3/uL (ref 0.7–4.0)
MCH: 22.2 pg — ABNORMAL LOW (ref 26.0–34.0)
MCHC: 35.2 g/dL (ref 30.0–36.0)
MCV: 63.1 fL — ABNORMAL LOW (ref 80.0–100.0)
Monocytes Absolute: 0.2 10*3/uL (ref 0.1–1.0)
Monocytes Relative: 3 %
Neutro Abs: 6.9 10*3/uL (ref 1.7–7.7)
Neutrophils Relative %: 86 %
Platelets: 117 10*3/uL — ABNORMAL LOW (ref 150–400)
RBC: 4.23 MIL/uL (ref 3.87–5.11)
RDW: 23.3 % — ABNORMAL HIGH (ref 11.5–15.5)
WBC: 8.1 10*3/uL (ref 4.0–10.5)
nRBC: 0 % (ref 0.0–0.2)

## 2021-08-27 LAB — COMPREHENSIVE METABOLIC PANEL
ALT: 13 U/L (ref 0–44)
AST: 13 U/L — ABNORMAL LOW (ref 15–41)
Albumin: 2.2 g/dL — ABNORMAL LOW (ref 3.5–5.0)
Alkaline Phosphatase: 133 U/L — ABNORMAL HIGH (ref 38–126)
Anion gap: 6 (ref 5–15)
BUN: 27 mg/dL — ABNORMAL HIGH (ref 6–20)
CO2: 26 mmol/L (ref 22–32)
Calcium: 8.2 mg/dL — ABNORMAL LOW (ref 8.9–10.3)
Chloride: 101 mmol/L (ref 98–111)
Creatinine, Ser: 0.76 mg/dL (ref 0.44–1.00)
GFR, Estimated: >60 mL/min (ref >60)
Glucose, Bld: 59 mg/dL — ABNORMAL LOW (ref 70–99)
Potassium: 3.9 mmol/L (ref 3.5–5.1)
Sodium: 133 mmol/L — ABNORMAL LOW (ref 135–145)
Total Bilirubin: 0.9 mg/dL (ref 0.3–1.2)
Total Protein: 5.7 g/dL — ABNORMAL LOW (ref 6.5–8.1)

## 2021-08-27 LAB — URINALYSIS, ROUTINE W REFLEX MICROSCOPIC
Bilirubin Urine: NEGATIVE
Glucose, UA: NEGATIVE mg/dL
Hgb urine dipstick: NEGATIVE
Ketones, ur: NEGATIVE mg/dL
Leukocytes,Ua: NEGATIVE
Nitrite: NEGATIVE
Protein, ur: NEGATIVE mg/dL
Specific Gravity, Urine: 1.023 (ref 1.005–1.030)
pH: 6 (ref 5.0–8.0)

## 2021-08-27 LAB — I-STAT BETA HCG BLOOD, ED (MC, WL, AP ONLY): I-stat hCG, quantitative: <5 m[IU]/mL (ref <5)

## 2021-08-27 LAB — LIPASE, BLOOD: Lipase: 35 U/L (ref 11–51)

## 2021-08-27 MED ORDER — ALUM & MAG HYDROXIDE-SIMETH 200-200-20 MG/5ML PO SUSP
30.0000 mL | Freq: Once | ORAL | Status: AC
  Administered 2021-08-27: 30 mL via ORAL
  Filled 2021-08-27: qty 30

## 2021-08-27 MED ORDER — LIDOCAINE VISCOUS HCL 2 % MT SOLN
15.0000 mL | Freq: Once | OROMUCOSAL | Status: AC
  Administered 2021-08-27: 15 mL via ORAL
  Filled 2021-08-27: qty 15

## 2021-08-27 MED ORDER — ACETAMINOPHEN 500 MG PO TABS
1000.0000 mg | ORAL_TABLET | Freq: Once | ORAL | Status: AC
  Administered 2021-08-27: 1000 mg via ORAL
  Filled 2021-08-27: qty 2

## 2021-08-27 MED ORDER — SODIUM CHLORIDE 0.9 % IV BOLUS: 1000.0000 mL | Freq: Once | INTRAVENOUS | Status: DC

## 2021-08-27 NOTE — ED Notes (Signed)
Patient refuses fluid bolus. ? ?

## 2021-08-27 NOTE — ED Notes (Signed)
Patient now states there was no trauma to the foot. She also complains of abdominal pain she has had for years.  ?

## 2021-08-27 NOTE — ED Notes (Addendum)
Patient moved when attempting to obtain IV access and vein blew.  Patient stated "that's it, I don't want anyone else sticking me, I don't want the fluids."  PA made aware. Patient educated on the risks of refusing and patient verbalized understanding.  ?

## 2021-08-27 NOTE — ED Triage Notes (Addendum)
Patient presents with trauma to her left foot and ankle. Event probably happened about 1 week ago. Patient struggles with drug addiction. She last used yesterday. Patient is currently homeless.  ? ? ?EMS vitals: ?162/98 BP ?110 HR ?96% SPO2 on room air ?20 RR ?

## 2021-08-27 NOTE — Discharge Instructions (Addendum)
You came to the emergency department today to be evaluated for your foot pain and abdominal pain.  The x-rays of your feet showed no broken bones or dislocations.  Your lab work showed that your hemoglobin was decreased however appears baseline for you.  Please follow-up with your primary care provider or urgent care next week to have this lab test rechecked. ? ?Please do not use any drugs as this can be harmful to your health. ? ?Get help right away if: ?Your pain does not go away as soon as your health care provider told you to expect. ?You cannot stop vomiting. ?Your pain is only in areas of the abdomen, such as the right side or the left lower portion of the abdomen. Pain on the right side could be caused by appendicitis. ?You have bloody or black stools, or stools that look like tar. ?You have severe pain, cramping, or bloating in your abdomen. ?You have signs of dehydration, such as: ?Dark urine, very little urine, or no urine. ?Cracked lips. ?Dry mouth. ?Sunken eyes. ?Sleepiness. ?Weakness. ?You have trouble breathing or chest pain. ?Your foot or toes turn white or blue. ?You have warmth and redness along your foot. ?

## 2021-08-27 NOTE — ED Provider Notes (Signed)
?[facility] DEPT ?Provider Note ? ? ?CSN: [id number] ?Arrival date & time: 08/27/21  1540 ? ?  ? ?History ? ?Chief Complaint  ?Patient presents with  ? Abdominal Pain  ? Foot Pain  ? ? ?Terri [name] is a 46 y.o. female with a pertinent history of hypertension, GERD, polysubstance use, status post cholecystectomy, status post gastric bypass.  Presents to the emergency department with a bilateral foot swelling, left great toe pain, and abdominal pain. ? ?Patient reports that she has had swelling to both of her feet and pain to left great toe for the last 3 days.  Patient denies any recent falls or traumatic injuries.  Patient states that pain is worse with touch and weightbearing.  Patient reports that she has been taking Tylenol, ice, and heat with minimal improvement in symptoms.  Patient states that she has not been on her feet more than usual. ? ?Patient reports that she started having abdominal pain on Wednesday.  Pain is located to her right upper abdomen.  Patient states that pain feels similar to previous episodes of ulcer she has had. ? ?Patient states that she was trying to get into [facility] program for drug rehab.  States that she was sent to [facility] emergency department on Tuesday to be medically cleared.  She states that she was admitted overnight due to anemia and hypokalemia.  Patient was discharged and attempted to return to [facility] however due to her reports of abdominal pain she told she needed to be medically cleared. ? ?Patient endorses cocaine and heroin use.  Last used cocaine Monday evening.  Patient last used heroin Wednesday.  Patient denies any IV drug use. ? ?Patient endorses subjective fever, chills, diarrhea.   ? ?Patient denies any nausea, vomiting, constipation, blood in stool, melena, dysuria, hematuria, urinary urgency, vaginal pain, vaginal bleeding, vaginal discharge. ? ? ?Abdominal Pain ?Associated symptoms: diarrhea and fever (subjective)   ?Associated  symptoms: no chest pain, no chills, no constipation, no dysuria, no hematuria, no nausea, no shortness of breath, no vaginal bleeding, no vaginal discharge and no vomiting   ?Foot Pain ?Associated symptoms include abdominal pain. Pertinent negatives include no chest pain, no headaches and no shortness of breath.  ? ?  ? ?Home Medications ?Prior to Admission medications   ?Medication Sig Start Date End Date Taking? Authorizing Provider  ?acetaminophen (TYLENOL) 500 MG tablet Take 2 tablets (1,000 mg total) by mouth every 8 (eight) hours as needed for mild pain. 05/29/21   Mariel Aloe, MD  ?amLODipine (NORVASC) 10 MG tablet Take 1 tablet (10 mg total) by mouth daily. 05/30/21 08/28/21  Jacqlyn Larsen, PA-C  ?Ferrous Fumarate (IRON) 18 MG TBCR Take 1 tablet (18 mg total) by mouth 2 (two) times daily after a meal. 04/06/21   Domenic Polite, MD  ?hydrALAZINE (APRESOLINE) 50 MG tablet Take 1 tablet (50 mg total) by mouth 2 (two) times daily. 05/30/21 08/28/21  Jacqlyn Larsen, PA-C  ?hydrOXYzine (ATARAX/VISTARIL) 25 MG tablet Take 1 tablet (25 mg total) by mouth every 8 (eight) hours as needed for anxiety. 04/06/21   Domenic Polite, MD  ?lisinopril (ZESTRIL) 10 MG tablet Take 1 tablet (10 mg total) by mouth daily. 05/30/21 08/28/21  Jacqlyn Larsen, PA-C  ?nicotine (NICODERM CQ - DOSED IN MG/24 HOURS) 21 mg/24hr patch Place 1 patch (21 mg total) onto the skin daily. 04/07/21   Domenic Polite, MD  ?ondansetron (ZOFRAN-ODT) 4 MG disintegrating tablet Take 1 tablet (4 mg total)  by mouth every 8 (eight) hours as needed for nausea or vomiting. 04/21/21   Tedd Sias, PA  ?pantoprazole (PROTONIX) 40 MG tablet Take 1 tablet (40 mg total) by mouth daily. 03/18/21   Danford, Suann Larry, MD  ?vitamin B-12 (CYANOCOBALAMIN) 1000 MCG tablet Take 1 tablet (1,000 mcg total) by mouth daily. ?Patient not taking: Reported on 07/04/2021 04/06/21   Domenic Polite, MD  ?   ? ?Allergies    ?Nsaids and Ketorolac tromethamine   ? ?Review of  Systems   ?Review of Systems  ?Constitutional:  Positive for fever (subjective). Negative for chills.  ?Eyes:  Negative for visual disturbance.  ?Respiratory:  Negative for shortness of breath.   ?Cardiovascular:  Negative for chest pain.  ?Gastrointestinal:  Positive for abdominal pain and diarrhea. Negative for abdominal distention, anal bleeding, blood in stool, constipation, nausea, rectal pain and vomiting.  ?Genitourinary:  Negative for decreased urine volume, difficulty urinating, dysuria, flank pain, frequency, genital sores, hematuria, pelvic pain, urgency, vaginal bleeding, vaginal discharge and vaginal pain.  ?Musculoskeletal:  Positive for arthralgias and myalgias. Negative for back pain and neck pain.  ?Skin:  Negative for color change, pallor, rash and wound.  ?Neurological:  Negative for dizziness, syncope, light-headedness and headaches.  ?Psychiatric/Behavioral:  Negative for confusion.   ? ?Physical Exam ?Updated Vital Signs ?BP (!) 163/131 (BP Location: Right Arm)   Pulse (!) 107   Temp 98.4 ?F (36.9 ?C) (Oral)   Resp 16   LMP  (LMP Unknown)   SpO2 99%  ?Physical Exam ?Vitals and nursing note reviewed.  ?Constitutional:   ?   General: She is not in acute distress. ?   Appearance: She is not ill-appearing, toxic-appearing or diaphoretic.  ?HENT:  ?   Head: Normocephalic.  ?Eyes:  ?   General: No scleral icterus.    ?   Right eye: No discharge.     ?   Left eye: No discharge.  ?Cardiovascular:  ?   Rate and Rhythm: Normal rate.  ?   Pulses:     ?     Dorsalis pedis pulses are 2+ on the right side and 2+ on the left side.  ?Pulmonary:  ?   Effort: Pulmonary effort is normal.  ?Abdominal:  ?   General: Abdomen is flat. A surgical scar is present. Bowel sounds are normal. There is no distension. There are no signs of injury.  ?   Palpations: Abdomen is soft. There is no mass or pulsatile mass.  ?   Tenderness: There is no abdominal tenderness. There is no guarding or rebound.  ?   Hernia: There is  no hernia in the umbilical area or ventral area.  ?Musculoskeletal:  ?   Right lower leg: Normal.  ?   Left lower leg: Normal.  ?   Right ankle: No swelling, deformity, ecchymosis or lacerations. No tenderness. Normal range of motion.  ?   Left ankle: No swelling, deformity, ecchymosis or lacerations. No tenderness. Normal range of motion.  ?   Right foot: Normal range of motion. Swelling present. No deformity, laceration, tenderness, bony tenderness or crepitus. Normal pulse.  ?   Left foot: Normal range of motion. Swelling, tenderness and bony tenderness present. No deformity, laceration or crepitus. Normal pulse.  ?   Comments: Tenderness to left great toe.  ?Feet:  ?   Right foot:  ?   Skin integrity: Callus and dry skin present. No ulcer, blister, skin breakdown, erythema, warmth or fissure.  ?  Toenail Condition: Right toenails are abnormally thick.  ?   Left foot:  ?   Skin integrity: Erythema, callus and dry skin present. No ulcer, blister, skin breakdown, warmth or fissure.  ?   Toenail Condition: Left toenails are abnormally thick.  ?   Comments: Minimal erythema to the left great toe.  Toe is exquisitely tender to palpation.  Trace swelling noted to bilateral feet. ?Skin: ?   General: Skin is warm and dry.  ?Neurological:  ?   General: No focal deficit present.  ?   Mental Status: She is alert.  ?   GCS: GCS eye subscore is 4. GCS verbal subscore is 5. GCS motor subscore is 6.  ?Psychiatric:     ?   Mood and Affect: Affect is tearful.     ?   Behavior: Behavior is cooperative.  ? ? ? ? ?ED Results / Procedures / Treatments   ?Labs ?(all labs ordered are listed, but only abnormal results are displayed) ?Labs Reviewed  ?COMPREHENSIVE METABOLIC PANEL - Abnormal; Notable for the following components:  ?    Result Value  ? Sodium 133 (*)   ? Glucose, Bld 59 (*)   ? BUN 27 (*)   ? Calcium 8.2 (*)   ? Total Protein 5.7 (*)   ? Albumin 2.2 (*)   ? AST 13 (*)   ? Alkaline Phosphatase 133 (*)   ? All other  components within normal limits  ?CBC WITH DIFFERENTIAL/PLATELET - Abnormal; Notable for the following components:  ? Hemoglobin 9.4 (*)   ? HCT 26.7 (*)   ? MCV 63.1 (*)   ? MCH 22.2 (*)   ? RDW 23.3 (*)   ? All oth

## 2021-08-30 IMAGING — CT CT ABD-PELV W/ CM
2 of 5 series · 16 of 46 positions shown, 18 images · IV contrast (OMNIPAQUE 350)
Comparison: January 13, 2021

CLINICAL DATA: Abdominal pain.

EXAM:
CT ABDOMEN AND PELVIS WITH CONTRAST
TECHNIQUE: Multidetector CT imaging of the abdomen and pelvis was performed
using the standard protocol following bolus administration of
intravenous contrast.
CONTRAST:  80mL OMNIPAQUE IOHEXOL 350 MG/ML SOLN

[Series 2: axial st · axial · 0.73mm/px · z∈[+1018,+1398]mm · 13 of 88 slices shown, 15 images]
[im 6/88  soft-tissue]
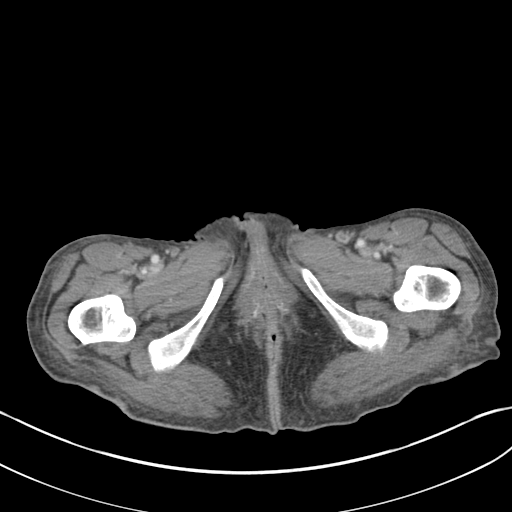
[im 6/88  bone]
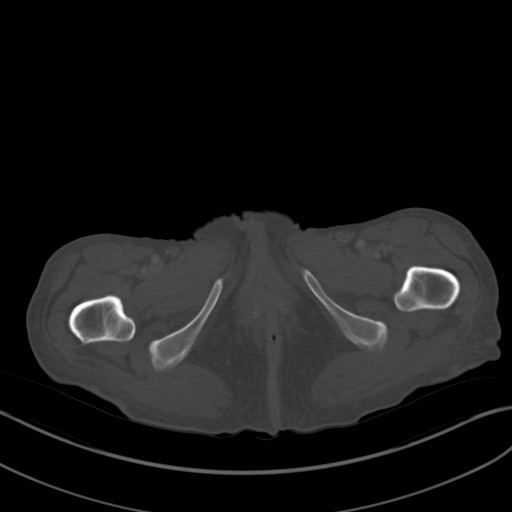
[im 12/88  soft-tissue]
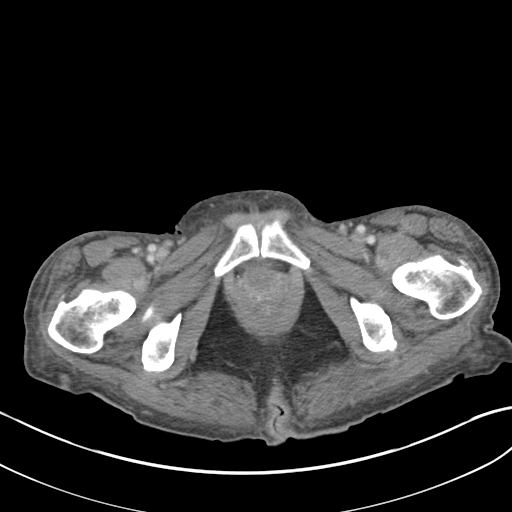
[im 18/88  soft-tissue]
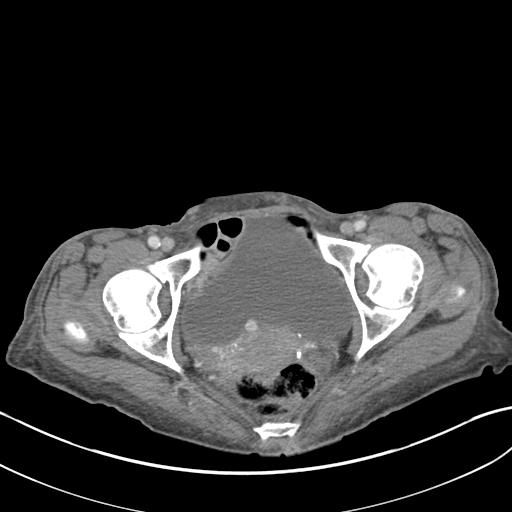
[im 24/88  soft-tissue]
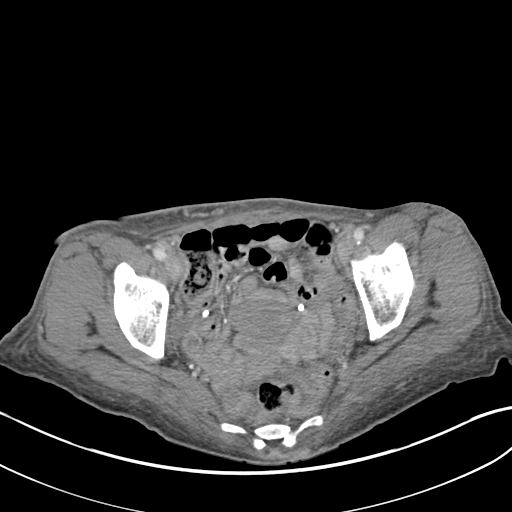
[im 30/88  soft-tissue]
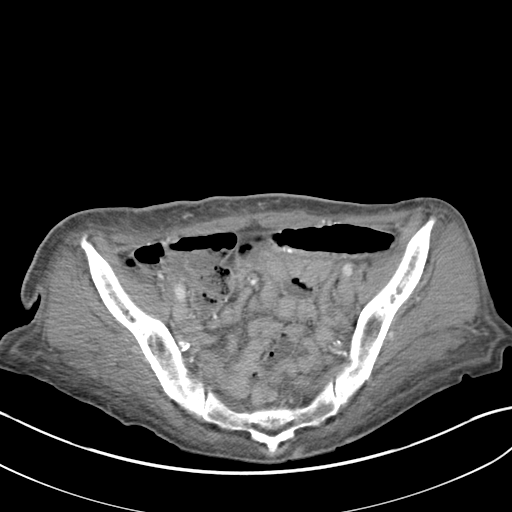
[im 35/88  soft-tissue]
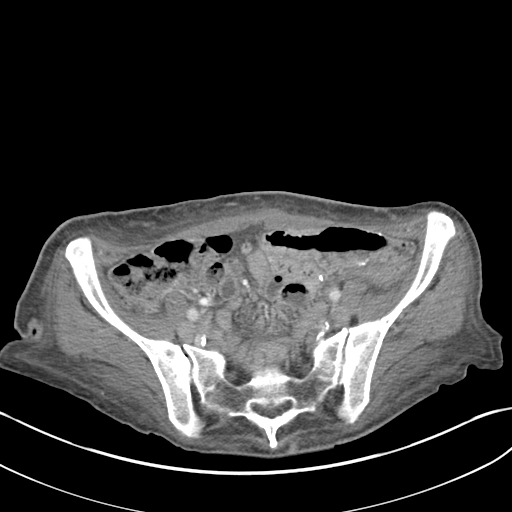
[im 47/88  soft-tissue]
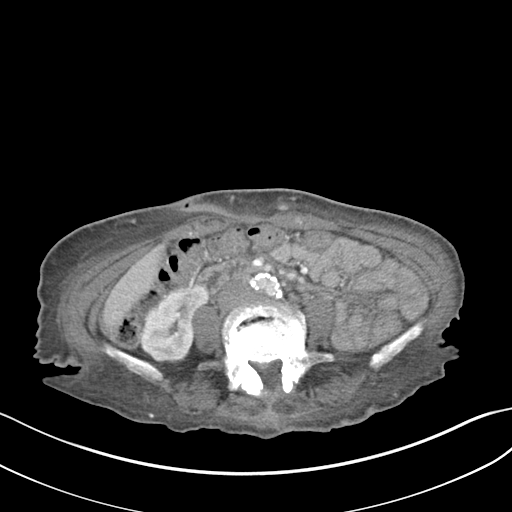
[im 53/88  soft-tissue]
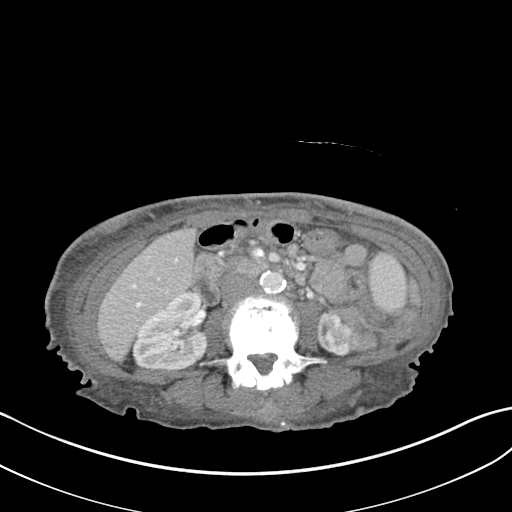
[im 59/88  soft-tissue]
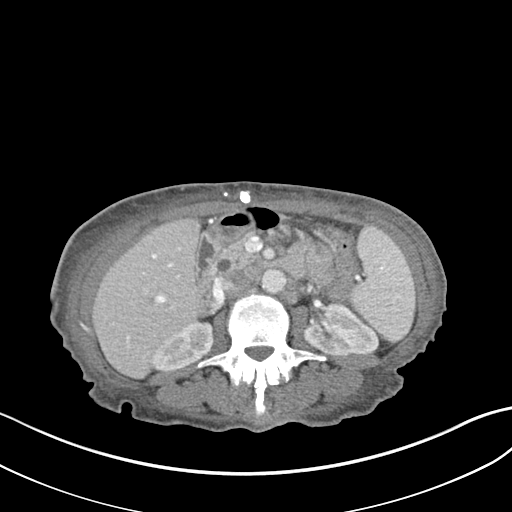
[im 59/88  bone]
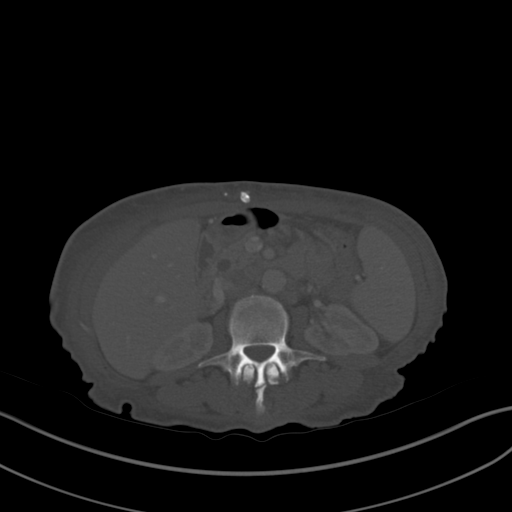
[im 64/88  soft-tissue]
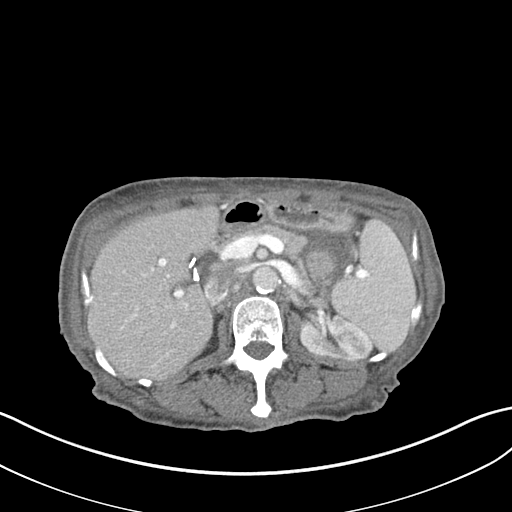
[im 70/88  soft-tissue]
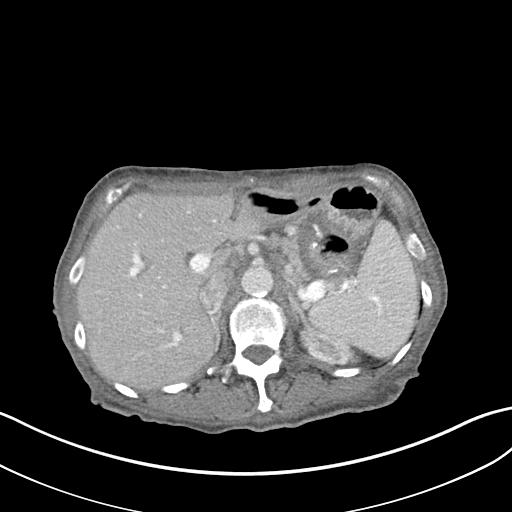
[im 76/88  soft-tissue]
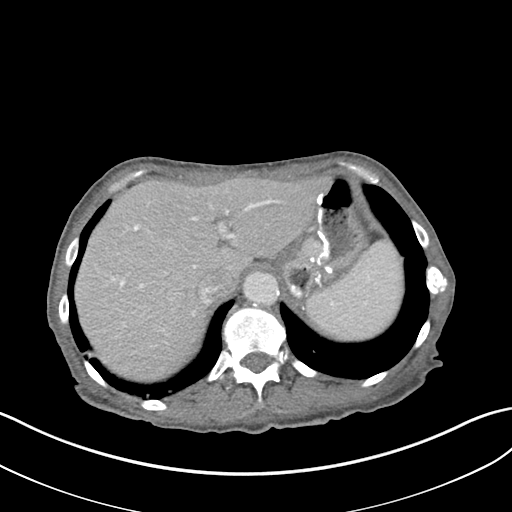
[im 82/88  soft-tissue]
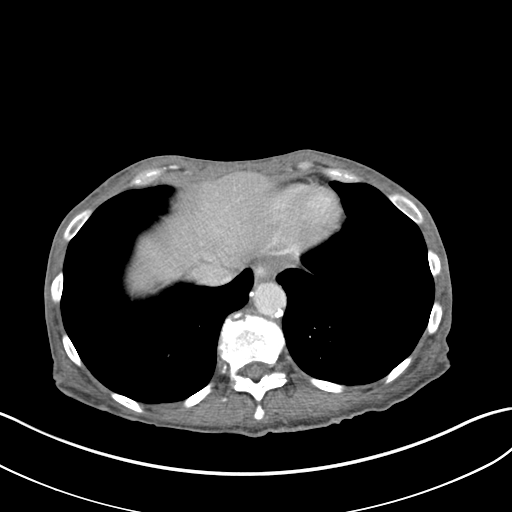

[Series 4: coronal st · coronal · 0.68mm/px · 3 of 110 slices shown]
[im 37/110  soft-tissue]
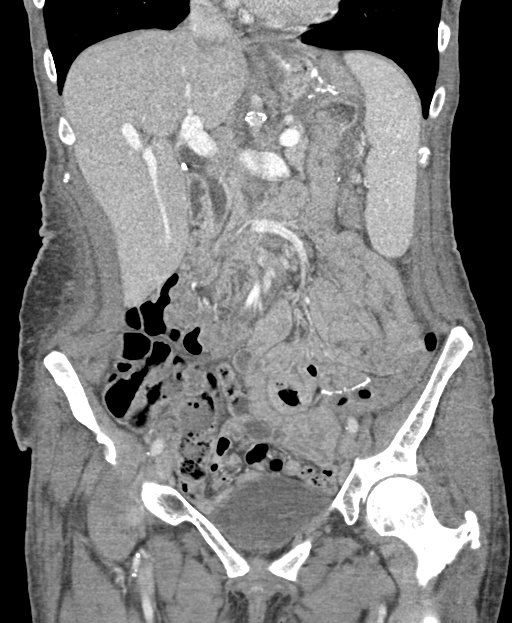
[im 49/110  soft-tissue]
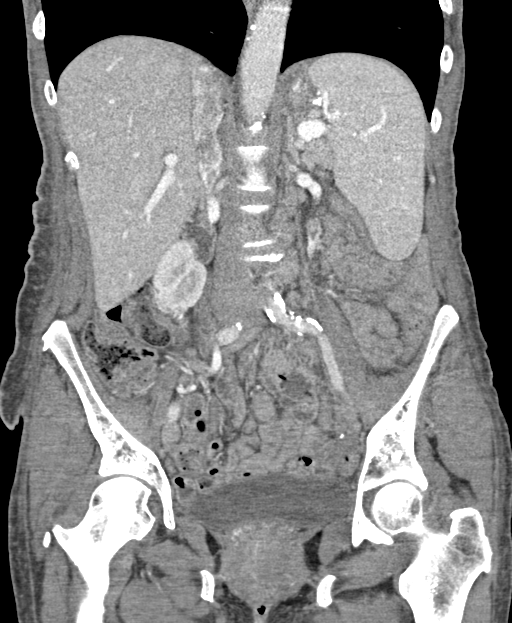
[im 61/110  soft-tissue]
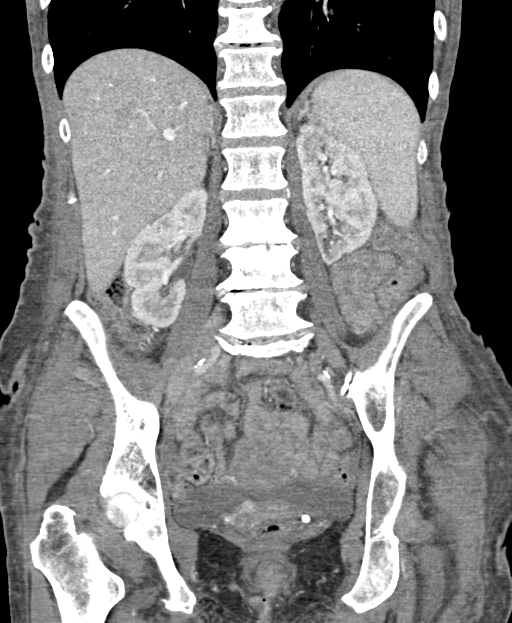

[16 of 46 positions shown; findings below may reference images not displayed]

FINDINGS: Lower chest: Mild atelectasis is seen within the posterior aspect of
the right lung base.

Hepatobiliary: A 1.1 cm x 0.6 cm cyst is seen within the lateral
aspect of the right lobe of the liver. Status post cholecystectomy.
The common bile duct measures approximately 1.0 cm.

Pancreas: Unremarkable. No pancreatic ductal dilatation or
surrounding inflammatory changes.

Spleen: The spleen is mildly enlarged and otherwise normal in
appearance.

Adrenals/Urinary Tract: Adrenal glands are unremarkable. Kidneys are
normal in size, without renal calculi or hydronephrosis. Renal
cortical thinning is noted on the left. Bladder is unremarkable.

Stomach/Bowel: Surgical sutures are seen within the gastric region
consistent with Roux-en-Y gastric bypass. A rounded structure is
again noted within the left upper quadrant, adjacent to the
anastomosis, which demonstrates a thickened wall and adjacent
inflammation (axial CT images 18 through 23, CT series 2). This
finding is stable in appearance when compared to the prior study.
Surgically anastomosed bowel is also seen within the anterior aspect
of the left lower quadrant. Appendix is not identified. No evidence
of bowel dilatation.

Vascular/Lymphatic: Aortic atherosclerosis. No enlarged abdominal or
pelvic lymph nodes.

Reproductive: Uterus and bilateral adnexa are unremarkable.

Other: No abdominal wall hernia or abnormality. No abdominopelvic
ascites.

Musculoskeletal: Degenerative changes are seen within the lumbar
spine, most prominent at the levels of L3-L4 and L4-L5.
IMPRESSION: 1. Stable findings suspicious for a perianastomotic ulcer, status
post recent Roux-en-Y gastric bypass.
2. Evidence of prior cholecystectomy.
3. Mild splenomegaly.

## 2021-08-31 ENCOUNTER — Other Ambulatory Visit: Payer: Self-pay

## 2021-08-31 ENCOUNTER — Emergency Department (HOSPITAL_COMMUNITY): Payer: Medicaid Other

## 2021-08-31 ENCOUNTER — Encounter (HOSPITAL_COMMUNITY): Payer: Self-pay | Admitting: Internal Medicine

## 2021-08-31 ENCOUNTER — Inpatient Hospital Stay (HOSPITAL_COMMUNITY)
Admission: EM | Admit: 2021-08-31 | Discharge: 2021-10-12 | DRG: 981 | Disposition: A | Discharge status: Skilled Nursing Facility | Payer: Medicaid Other | Attending: Internal Medicine | Admitting: Internal Medicine

## 2021-08-31 DIAGNOSIS — Z8249 Family history of ischemic heart disease and other diseases of the circulatory system: Secondary | ICD-10-CM

## 2021-08-31 DIAGNOSIS — F1124 Opioid dependence with opioid-induced mood disorder: Secondary | ICD-10-CM | POA: Diagnosis present

## 2021-08-31 DIAGNOSIS — R188 Other ascites: Secondary | ICD-10-CM | POA: Diagnosis present

## 2021-08-31 DIAGNOSIS — J181 Lobar pneumonia, unspecified organism: Secondary | ICD-10-CM | POA: Insufficient documentation

## 2021-08-31 DIAGNOSIS — Z5900 Homelessness unspecified: Secondary | ICD-10-CM

## 2021-08-31 DIAGNOSIS — R2232 Localized swelling, mass and lump, left upper limb: Secondary | ICD-10-CM

## 2021-08-31 DIAGNOSIS — Z046 Encounter for general psychiatric examination, requested by authority: Secondary | ICD-10-CM

## 2021-08-31 DIAGNOSIS — Z765 Malingerer [conscious simulation]: Secondary | ICD-10-CM

## 2021-08-31 DIAGNOSIS — Z79899 Other long term (current) drug therapy: Secondary | ICD-10-CM

## 2021-08-31 DIAGNOSIS — B3781 Candidal esophagitis: Secondary | ICD-10-CM | POA: Diagnosis present

## 2021-08-31 DIAGNOSIS — D72829 Elevated white blood cell count, unspecified: Secondary | ICD-10-CM | POA: Diagnosis present

## 2021-08-31 DIAGNOSIS — J9602 Acute respiratory failure with hypercapnia: Secondary | ICD-10-CM | POA: Diagnosis not present

## 2021-08-31 DIAGNOSIS — E876 Hypokalemia: Secondary | ICD-10-CM | POA: Diagnosis not present

## 2021-08-31 DIAGNOSIS — I10 Essential (primary) hypertension: Secondary | ICD-10-CM | POA: Diagnosis present

## 2021-08-31 DIAGNOSIS — M546 Pain in thoracic spine: Secondary | ICD-10-CM | POA: Diagnosis present

## 2021-08-31 DIAGNOSIS — G9341 Metabolic encephalopathy: Secondary | ICD-10-CM | POA: Diagnosis not present

## 2021-08-31 DIAGNOSIS — J189 Pneumonia, unspecified organism: Secondary | ICD-10-CM | POA: Diagnosis not present

## 2021-08-31 DIAGNOSIS — M549 Dorsalgia, unspecified: Secondary | ICD-10-CM

## 2021-08-31 DIAGNOSIS — Z515 Encounter for palliative care: Secondary | ICD-10-CM

## 2021-08-31 DIAGNOSIS — K7460 Unspecified cirrhosis of liver: Secondary | ICD-10-CM | POA: Diagnosis present

## 2021-08-31 DIAGNOSIS — L89106 Pressure-induced deep tissue damage of unspecified part of back: Secondary | ICD-10-CM | POA: Diagnosis not present

## 2021-08-31 DIAGNOSIS — E8770 Fluid overload, unspecified: Secondary | ICD-10-CM | POA: Diagnosis not present

## 2021-08-31 DIAGNOSIS — D6959 Other secondary thrombocytopenia: Secondary | ICD-10-CM | POA: Diagnosis present

## 2021-08-31 DIAGNOSIS — E871 Hypo-osmolality and hyponatremia: Secondary | ICD-10-CM | POA: Diagnosis not present

## 2021-08-31 DIAGNOSIS — J9601 Acute respiratory failure with hypoxia: Secondary | ICD-10-CM | POA: Diagnosis not present

## 2021-08-31 DIAGNOSIS — I708 Atherosclerosis of other arteries: Secondary | ICD-10-CM | POA: Diagnosis present

## 2021-08-31 DIAGNOSIS — Z7189 Other specified counseling: Secondary | ICD-10-CM

## 2021-08-31 DIAGNOSIS — Z8616 Personal history of COVID-19: Secondary | ICD-10-CM

## 2021-08-31 DIAGNOSIS — K921 Melena: Secondary | ICD-10-CM | POA: Diagnosis present

## 2021-08-31 DIAGNOSIS — K769 Liver disease, unspecified: Secondary | ICD-10-CM | POA: Insufficient documentation

## 2021-08-31 DIAGNOSIS — Y95 Nosocomial condition: Secondary | ICD-10-CM | POA: Diagnosis not present

## 2021-08-31 DIAGNOSIS — J15212 Pneumonia due to Methicillin resistant Staphylococcus aureus: Secondary | ICD-10-CM | POA: Diagnosis not present

## 2021-08-31 DIAGNOSIS — K264 Chronic or unspecified duodenal ulcer with hemorrhage: Secondary | ICD-10-CM | POA: Diagnosis present

## 2021-08-31 DIAGNOSIS — M7989 Other specified soft tissue disorders: Secondary | ICD-10-CM | POA: Diagnosis present

## 2021-08-31 DIAGNOSIS — M869 Osteomyelitis, unspecified: Secondary | ICD-10-CM | POA: Diagnosis present

## 2021-08-31 DIAGNOSIS — R680 Hypothermia, not associated with low environmental temperature: Secondary | ICD-10-CM | POA: Diagnosis present

## 2021-08-31 DIAGNOSIS — K750 Abscess of liver: Secondary | ICD-10-CM | POA: Diagnosis present

## 2021-08-31 DIAGNOSIS — R64 Cachexia: Secondary | ICD-10-CM | POA: Diagnosis present

## 2021-08-31 DIAGNOSIS — E162 Hypoglycemia, unspecified: Secondary | ICD-10-CM | POA: Diagnosis not present

## 2021-08-31 DIAGNOSIS — L89116 Pressure-induced deep tissue damage of right upper back: Secondary | ICD-10-CM | POA: Diagnosis not present

## 2021-08-31 DIAGNOSIS — F1910 Other psychoactive substance abuse, uncomplicated: Secondary | ICD-10-CM | POA: Diagnosis present

## 2021-08-31 DIAGNOSIS — F1114 Opioid abuse with opioid-induced mood disorder: Secondary | ICD-10-CM | POA: Diagnosis present

## 2021-08-31 DIAGNOSIS — R300 Dysuria: Secondary | ICD-10-CM | POA: Diagnosis not present

## 2021-08-31 DIAGNOSIS — K2211 Ulcer of esophagus with bleeding: Secondary | ICD-10-CM | POA: Diagnosis present

## 2021-08-31 DIAGNOSIS — I81 Portal vein thrombosis: Secondary | ICD-10-CM | POA: Diagnosis present

## 2021-08-31 DIAGNOSIS — I96 Gangrene, not elsewhere classified: Secondary | ICD-10-CM | POA: Diagnosis present

## 2021-08-31 DIAGNOSIS — K254 Chronic or unspecified gastric ulcer with hemorrhage: Principal | ICD-10-CM | POA: Diagnosis present

## 2021-08-31 DIAGNOSIS — Z538 Procedure and treatment not carried out for other reasons: Secondary | ICD-10-CM | POA: Diagnosis not present

## 2021-08-31 DIAGNOSIS — L97509 Non-pressure chronic ulcer of other part of unspecified foot with unspecified severity: Secondary | ICD-10-CM | POA: Diagnosis present

## 2021-08-31 DIAGNOSIS — C787 Secondary malignant neoplasm of liver and intrahepatic bile duct: Secondary | ICD-10-CM | POA: Diagnosis present

## 2021-08-31 DIAGNOSIS — I160 Hypertensive urgency: Secondary | ICD-10-CM | POA: Diagnosis not present

## 2021-08-31 DIAGNOSIS — J810 Acute pulmonary edema: Secondary | ICD-10-CM | POA: Diagnosis not present

## 2021-08-31 DIAGNOSIS — L02214 Cutaneous abscess of groin: Secondary | ICD-10-CM | POA: Diagnosis not present

## 2021-08-31 DIAGNOSIS — F419 Anxiety disorder, unspecified: Secondary | ICD-10-CM | POA: Diagnosis present

## 2021-08-31 DIAGNOSIS — D62 Acute posthemorrhagic anemia: Secondary | ICD-10-CM | POA: Diagnosis present

## 2021-08-31 DIAGNOSIS — E43 Unspecified severe protein-calorie malnutrition: Secondary | ICD-10-CM | POA: Diagnosis present

## 2021-08-31 DIAGNOSIS — L02612 Cutaneous abscess of left foot: Secondary | ICD-10-CM | POA: Diagnosis present

## 2021-08-31 DIAGNOSIS — A4101 Sepsis due to Methicillin susceptible Staphylococcus aureus: Secondary | ICD-10-CM | POA: Diagnosis not present

## 2021-08-31 DIAGNOSIS — G8929 Other chronic pain: Secondary | ICD-10-CM | POA: Diagnosis present

## 2021-08-31 DIAGNOSIS — F17210 Nicotine dependence, cigarettes, uncomplicated: Secondary | ICD-10-CM | POA: Diagnosis present

## 2021-08-31 DIAGNOSIS — L89326 Pressure-induced deep tissue damage of left buttock: Secondary | ICD-10-CM | POA: Diagnosis not present

## 2021-08-31 DIAGNOSIS — K922 Gastrointestinal hemorrhage, unspecified: Secondary | ICD-10-CM | POA: Diagnosis not present

## 2021-08-31 DIAGNOSIS — Z886 Allergy status to analgesic agent status: Secondary | ICD-10-CM

## 2021-08-31 DIAGNOSIS — Z86718 Personal history of other venous thrombosis and embolism: Secondary | ICD-10-CM

## 2021-08-31 DIAGNOSIS — B9561 Methicillin susceptible Staphylococcus aureus infection as the cause of diseases classified elsewhere: Secondary | ICD-10-CM | POA: Diagnosis present

## 2021-08-31 DIAGNOSIS — J15211 Pneumonia due to Methicillin susceptible Staphylococcus aureus: Secondary | ICD-10-CM | POA: Diagnosis not present

## 2021-08-31 DIAGNOSIS — Z6821 Body mass index (BMI) 21.0-21.9, adult: Secondary | ICD-10-CM

## 2021-08-31 DIAGNOSIS — Z9851 Tubal ligation status: Secondary | ICD-10-CM

## 2021-08-31 DIAGNOSIS — M1990 Unspecified osteoarthritis, unspecified site: Secondary | ICD-10-CM | POA: Diagnosis present

## 2021-08-31 DIAGNOSIS — R45851 Suicidal ideations: Secondary | ICD-10-CM | POA: Diagnosis not present

## 2021-08-31 DIAGNOSIS — L89156 Pressure-induced deep tissue damage of sacral region: Secondary | ICD-10-CM | POA: Diagnosis not present

## 2021-08-31 DIAGNOSIS — N898 Other specified noninflammatory disorders of vagina: Secondary | ICD-10-CM | POA: Diagnosis not present

## 2021-08-31 DIAGNOSIS — Z888 Allergy status to other drugs, medicaments and biological substances status: Secondary | ICD-10-CM

## 2021-08-31 DIAGNOSIS — J90 Pleural effusion, not elsewhere classified: Secondary | ICD-10-CM | POA: Diagnosis present

## 2021-08-31 DIAGNOSIS — A4901 Methicillin susceptible Staphylococcus aureus infection, unspecified site: Secondary | ICD-10-CM | POA: Insufficient documentation

## 2021-08-31 DIAGNOSIS — Z5329 Procedure and treatment not carried out because of patient's decision for other reasons: Secondary | ICD-10-CM | POA: Diagnosis not present

## 2021-08-31 DIAGNOSIS — Z9884 Bariatric surgery status: Secondary | ICD-10-CM

## 2021-08-31 DIAGNOSIS — D649 Anemia, unspecified: Secondary | ICD-10-CM

## 2021-08-31 DIAGNOSIS — E538 Deficiency of other specified B group vitamins: Secondary | ICD-10-CM | POA: Diagnosis present

## 2021-08-31 DIAGNOSIS — R6521 Severe sepsis with septic shock: Secondary | ICD-10-CM | POA: Diagnosis not present

## 2021-08-31 DIAGNOSIS — E872 Acidosis, unspecified: Secondary | ICD-10-CM | POA: Diagnosis present

## 2021-08-31 DIAGNOSIS — Z9049 Acquired absence of other specified parts of digestive tract: Secondary | ICD-10-CM

## 2021-08-31 DIAGNOSIS — L8990 Pressure ulcer of unspecified site, unspecified stage: Secondary | ICD-10-CM | POA: Insufficient documentation

## 2021-08-31 DIAGNOSIS — R9389 Abnormal findings on diagnostic imaging of other specified body structures: Secondary | ICD-10-CM

## 2021-08-31 LAB — POC OCCULT BLOOD, ED: Fecal Occult Bld: POSITIVE — AB

## 2021-08-31 LAB — CBC WITH DIFFERENTIAL/PLATELET
Abs Immature Granulocytes: 0.06 10*3/uL (ref 0.00–0.07)
Basophils Absolute: 0 10*3/uL (ref 0.0–0.1)
Basophils Relative: 0 %
Eosinophils Absolute: 0 10*3/uL (ref 0.0–0.5)
Eosinophils Relative: 0 %
HCT: 13.3 % — ABNORMAL LOW (ref 36.0–46.0)
Hemoglobin: 4.6 g/dL — CL (ref 12.0–15.0)
Immature Granulocytes: 1 %
Lymphocytes Relative: 11 %
Lymphs Abs: 1.3 10*3/uL (ref 0.7–4.0)
MCH: 21.9 pg — ABNORMAL LOW (ref 26.0–34.0)
MCHC: 34.6 g/dL (ref 30.0–36.0)
MCV: 63.3 fL — ABNORMAL LOW (ref 80.0–100.0)
Monocytes Absolute: 0.2 10*3/uL (ref 0.1–1.0)
Monocytes Relative: 2 %
Neutro Abs: 10.4 10*3/uL — ABNORMAL HIGH (ref 1.7–7.7)
Neutrophils Relative %: 86 %
Platelets: 122 10*3/uL — ABNORMAL LOW (ref 150–400)
RBC: 2.1 MIL/uL — ABNORMAL LOW (ref 3.87–5.11)
RDW: 25.5 % — ABNORMAL HIGH (ref 11.5–15.5)
WBC: 12 10*3/uL — ABNORMAL HIGH (ref 4.0–10.5)
nRBC: 0 % (ref 0.0–0.2)

## 2021-08-31 LAB — BASIC METABOLIC PANEL
Anion gap: 7 (ref 5–15)
BUN: 72 mg/dL — ABNORMAL HIGH (ref 6–20)
CO2: 23 mmol/L (ref 22–32)
Calcium: 7.5 mg/dL — ABNORMAL LOW (ref 8.9–10.3)
Chloride: 102 mmol/L (ref 98–111)
Creatinine, Ser: 0.88 mg/dL (ref 0.44–1.00)
GFR, Estimated: >60 mL/min (ref >60)
Glucose, Bld: 71 mg/dL (ref 70–99)
Potassium: 4 mmol/L (ref 3.5–5.1)
Sodium: 132 mmol/L — ABNORMAL LOW (ref 135–145)

## 2021-08-31 LAB — URINALYSIS, ROUTINE W REFLEX MICROSCOPIC
Bilirubin Urine: NEGATIVE
Glucose, UA: NEGATIVE mg/dL
Hgb urine dipstick: NEGATIVE
Ketones, ur: NEGATIVE mg/dL
Leukocytes,Ua: NEGATIVE
Nitrite: NEGATIVE
Protein, ur: NEGATIVE mg/dL
Specific Gravity, Urine: 1.02 (ref 1.005–1.030)
pH: 5 (ref 5.0–8.0)

## 2021-08-31 LAB — RAPID URINE DRUG SCREEN, HOSP PERFORMED
Amphetamines: NOT DETECTED
Barbiturates: NOT DETECTED
Benzodiazepines: NOT DETECTED
Cocaine: POSITIVE — AB
Opiates: NOT DETECTED
Tetrahydrocannabinol: NOT DETECTED

## 2021-08-31 LAB — HEPATIC FUNCTION PANEL
ALT: 16 U/L (ref 0–44)
AST: 26 U/L (ref 15–41)
Albumin: 1.8 g/dL — ABNORMAL LOW (ref 3.5–5.0)
Alkaline Phosphatase: 115 U/L (ref 38–126)
Bilirubin, Direct: 0.2 mg/dL (ref 0.0–0.2)
Indirect Bilirubin: 0.4 mg/dL (ref 0.3–0.9)
Total Bilirubin: 0.6 mg/dL (ref 0.3–1.2)
Total Protein: 4.6 g/dL — ABNORMAL LOW (ref 6.5–8.1)

## 2021-08-31 LAB — LIPASE, BLOOD: Lipase: 37 U/L (ref 11–51)

## 2021-08-31 LAB — MAGNESIUM: Magnesium: 2 mg/dL (ref 1.7–2.4)

## 2021-08-31 LAB — RETICULOCYTES
Immature Retic Fract: 16.5 % — ABNORMAL HIGH (ref 2.3–15.9)
RBC.: 2.11 MIL/uL — ABNORMAL LOW (ref 3.87–5.11)
Retic Count, Absolute: 38.6 10*3/uL (ref 19.0–186.0)
Retic Ct Pct: 1.8 % (ref 0.4–3.1)

## 2021-08-31 LAB — PREPARE RBC (CROSSMATCH)

## 2021-08-31 LAB — ETHANOL: Alcohol, Ethyl (B): <10 mg/dL (ref <10)

## 2021-08-31 LAB — I-STAT BETA HCG BLOOD, ED (MC, WL, AP ONLY): I-stat hCG, quantitative: <5 m[IU]/mL (ref <5)

## 2021-08-31 LAB — PROTIME-INR
INR: 1.3 — ABNORMAL HIGH (ref 0.8–1.2)
Prothrombin Time: 16.2 seconds — ABNORMAL HIGH (ref 11.4–15.2)

## 2021-08-31 LAB — LACTIC ACID, PLASMA: Lactic Acid, Venous: 2.7 mmol/L (ref 0.5–1.9)

## 2021-08-31 MED ORDER — FENTANYL CITRATE PF 50 MCG/ML IJ SOSY
25.0000 ug | PREFILLED_SYRINGE | INTRAMUSCULAR | Status: DC | PRN
  Administered 2021-08-31 – 2021-09-02 (×17): 25 ug via INTRAVENOUS
  Filled 2021-08-31 (×17): qty 1

## 2021-08-31 MED ORDER — SODIUM CHLORIDE 0.9 % IV SOLN
1.0000 g | Freq: Once | INTRAVENOUS | Status: AC
  Administered 2021-08-31: 1 g via INTRAVENOUS
  Filled 2021-08-31: qty 10

## 2021-08-31 MED ORDER — HYDROMORPHONE HCL 1 MG/ML IJ SOLN
1.0000 mg | Freq: Once | INTRAMUSCULAR | Status: AC
  Administered 2021-08-31: 1 mg via INTRAVENOUS
  Filled 2021-08-31: qty 1

## 2021-08-31 MED ORDER — ACETAMINOPHEN 650 MG RE SUPP
650.0000 mg | Freq: Four times a day (QID) | RECTAL | Status: DC | PRN
  Filled 2021-08-31: qty 1

## 2021-08-31 MED ORDER — ONDANSETRON HCL 4 MG/2ML IJ SOLN
4.0000 mg | Freq: Once | INTRAMUSCULAR | Status: AC
  Administered 2021-08-31: 4 mg via INTRAVENOUS
  Filled 2021-08-31: qty 2

## 2021-08-31 MED ORDER — SODIUM CHLORIDE 0.9 % IV SOLN: INTRAVENOUS | Status: DC

## 2021-08-31 MED ORDER — SODIUM CHLORIDE 0.9 % IV SOLN: 10.0000 mL/h | Freq: Once | INTRAVENOUS | Status: DC

## 2021-08-31 MED ORDER — ONDANSETRON HCL 4 MG/2ML IJ SOLN
4.0000 mg | Freq: Four times a day (QID) | INTRAMUSCULAR | Status: DC | PRN
  Administered 2021-09-09 – 2021-09-13 (×2): 4 mg via INTRAVENOUS
  Filled 2021-08-31 (×4): qty 2

## 2021-08-31 MED ORDER — SODIUM CHLORIDE 0.9 % IV BOLUS
1000.0000 mL | Freq: Once | INTRAVENOUS | Status: AC
  Administered 2021-08-31: 1000 mL via INTRAVENOUS

## 2021-08-31 MED ORDER — IOHEXOL 350 MG/ML SOLN
100.0000 mL | Freq: Once | INTRAVENOUS | Status: AC | PRN
  Administered 2021-08-31: 100 mL via INTRAVENOUS

## 2021-08-31 MED ORDER — SODIUM CHLORIDE 0.9% IV SOLUTION: Freq: Once | INTRAVENOUS | Status: AC

## 2021-08-31 MED ORDER — ACETAMINOPHEN 325 MG PO TABS
650.0000 mg | ORAL_TABLET | Freq: Four times a day (QID) | ORAL | Status: DC | PRN
  Administered 2021-09-02 – 2021-09-18 (×11): 650 mg via ORAL
  Filled 2021-08-31 (×12): qty 2

## 2021-08-31 MED ORDER — NALOXONE HCL 0.4 MG/ML IJ SOLN: 0.4000 mg | INTRAMUSCULAR | Status: DC | PRN

## 2021-08-31 MED ORDER — PANTOPRAZOLE INFUSION (NEW) - SIMPLE MED
8.0000 mg/h | INTRAVENOUS | Status: DC
  Administered 2021-08-31 – 2021-09-05 (×11): 8 mg/h via INTRAVENOUS
  Filled 2021-08-31 (×5): qty 80
  Filled 2021-08-31: qty 100
  Filled 2021-08-31: qty 80
  Filled 2021-08-31 (×2): qty 100
  Filled 2021-08-31 (×2): qty 80
  Filled 2021-08-31 (×3): qty 100

## 2021-08-31 MED ORDER — FENTANYL CITRATE PF 50 MCG/ML IJ SOSY
50.0000 ug | PREFILLED_SYRINGE | Freq: Once | INTRAMUSCULAR | Status: AC
  Administered 2021-08-31: 50 ug via INTRAVENOUS
  Filled 2021-08-31: qty 1

## 2021-08-31 MED ORDER — PANTOPRAZOLE 80MG IVPB - SIMPLE MED
80.0000 mg | Freq: Once | INTRAVENOUS | Status: AC
  Administered 2021-08-31: 80 mg via INTRAVENOUS
  Filled 2021-08-31: qty 80

## 2021-08-31 NOTE — Assessment & Plan Note (Addendum)
--  resolved. EGD 4/13 with esophageal plaques c/w candidiasis, large cratered non bleeding jejunal ulcer with non stigmata of bleeding.  Nodular regions more like regenerative tissue and not like visible vessels. ?PPI BID x2 months, sucralfate QID x2 weeks ?

## 2021-08-31 NOTE — Assessment & Plan Note (Addendum)
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

## 2021-08-31 NOTE — ED Provider Notes (Signed)
?[facility] DEPT ?Provider Note ? ? ?CSN: [id number] ?Arrival date & time: 08/31/21  1513 ? ?  ? ?History ? ?Chief Complaint  ?Patient presents with  ? Rectal Bleeding  ? ? ?Terri [name] is a 46 y.o. female. ? ?HPI ? 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  The patient was reportedly found in her car with a pile of melena outside her car and her pants full of dark bloody melena.  She is a reported heroin user and states that she last used 5 days ago.  She is endorsing abdominal pain in the epigastric region that is sharp and severe. ? ?Home Medications ?Prior to Admission medications   ?Medication Sig Start Date End Date Taking? Authorizing Provider  ?acetaminophen (TYLENOL) 500 MG tablet Take 2 tablets (1,000 mg total) by mouth every 8 (eight) hours as needed for mild pain. 05/29/21   Mariel Aloe, MD  ?amLODipine (NORVASC) 10 MG tablet Take 1 tablet (10 mg total) by mouth daily. 05/30/21 08/28/21  Jacqlyn Larsen, PA-C  ?Ferrous Fumarate (IRON) 18 MG TBCR Take 1 tablet (18 mg total) by mouth 2 (two) times daily after a meal. 04/06/21   Domenic Polite, MD  ?hydrALAZINE (APRESOLINE) 50 MG tablet Take 1 tablet (50 mg total) by mouth 2 (two) times daily. 05/30/21 08/28/21  Jacqlyn Larsen, PA-C  ?hydrOXYzine (ATARAX/VISTARIL) 25 MG tablet Take 1 tablet (25 mg total) by mouth every 8 (eight) hours as needed for anxiety. 04/06/21   Domenic Polite, MD  ?lisinopril (ZESTRIL) 10 MG tablet Take 1 tablet (10 mg total) by mouth daily. 05/30/21 08/28/21  Jacqlyn Larsen, PA-C  ?nicotine (NICODERM CQ - DOSED IN MG/24 HOURS) 21 mg/24hr patch Place 1 patch (21 mg total) onto the skin daily. 04/07/21   Domenic Polite, MD  ?ondansetron (ZOFRAN-ODT) 4 MG disintegrating tablet Take 1 tablet (4 mg total) by mouth every 8  (eight) hours as needed for nausea or vomiting. 04/21/21   Tedd Sias, PA  ?pantoprazole (PROTONIX) 40 MG tablet Take 1 tablet (40 mg total) by mouth daily. 03/18/21   Danford, Suann Larry, MD  ?vitamin B-12 (CYANOCOBALAMIN) 1000 MCG tablet Take 1 tablet (1,000 mcg total) by mouth daily. ?Patient not taking: Reported on 07/04/2021 04/06/21   Domenic Polite, MD  ?   ? ?Allergies    ?Nsaids and Ketorolac tromethamine   ? ?Review of Systems   ?Review of Systems  ?All other systems reviewed and are negative. ? ?Physical Exam ?Updated Vital Signs ?BP (!) 161/98   Pulse 71   Temp (!) 94.3 ?F (34.6 ?C)   Resp 12   LMP  (LMP Unknown)   SpO2 100%  ?Physical Exam ?Vitals and nursing note reviewed. Exam conducted with a chaperone present.  ?Constitutional:   ?   Appearance: She is well-developed. She is ill-appearing.  ?   Comments: Cachectic appearing, ill-appearing  ?HENT:  ?   Head: Normocephalic and atraumatic.  ?Eyes:  ?   Conjunctiva/sclera: Conjunctivae normal.  ?Cardiovascular:  ?   Rate and Rhythm: Normal rate and regular rhythm.  ?Pulmonary:  ?   Effort: Pulmonary effort is normal. No respiratory distress.  ?   Breath sounds: Normal breath sounds.  ?Abdominal:  ?   Palpations: Abdomen is soft.  ?   Tenderness: There is generalized abdominal tenderness.  There is guarding.  ?Genitourinary: ?   Rectum: Guaiac result positive.  ?   Comments: Melena present ?Musculoskeletal:     ?   General: No swelling.  ?   Cervical back: Neck supple.  ?Skin: ?   General: Skin is warm and dry.  ?   Capillary Refill: Capillary refill takes less than 2 seconds.  ?Neurological:  ?   Mental Status: She is alert.  ?Psychiatric:     ?   Mood and Affect: Mood normal.  ? ? ?ED Results / Procedures / Treatments   ?Labs ?(all labs ordered are listed, but only abnormal results are displayed) ?Labs Reviewed  ?CBC WITH DIFFERENTIAL/PLATELET - Abnormal; Notable for the following components:  ?    Result Value  ? WBC 12.0 (*)   ? RBC 2.10  (*)   ? Hemoglobin 4.6 (*)   ? HCT 13.3 (*)   ? MCV 63.3 (*)   ? MCH 21.9 (*)   ? RDW 25.5 (*)   ? Platelets 122 (*)   ? Neutro Abs 10.4 (*)   ? All other components within normal limits  ?PROTIME-INR - Abnormal; Notable for the following components:  ? Prothrombin Time 16.2 (*)   ? INR 1.3 (*)   ? All other components within normal limits  ?HEPATIC FUNCTION PANEL - Abnormal; Notable for the following components:  ? Total Protein 4.6 (*)   ? Albumin 1.8 (*)   ? All other components within normal limits  ?BASIC METABOLIC PANEL - Abnormal; Notable for the following components:  ? Sodium 132 (*)   ? BUN 72 (*)   ? Calcium 7.5 (*)   ? All other components within normal limits  ?LACTIC ACID, PLASMA - Abnormal; Notable for the following components:  ? Lactic Acid, Venous 2.7 (*)   ? All other components within normal limits  ?RETICULOCYTES - Abnormal; Notable for the following components:  ? RBC. 2.11 (*)   ? Immature Retic Fract 16.5 (*)   ? All other components within normal limits  ?RAPID URINE DRUG SCREEN, HOSP PERFORMED - Abnormal; Notable for the following components:  ? Cocaine POSITIVE (*)   ? All other components within normal limits  ?POC OCCULT BLOOD, ED - Abnormal; Notable for the following components:  ? Fecal Occult Bld POSITIVE (*)   ? All other components within normal limits  ?LIPASE, BLOOD  ?URINALYSIS, ROUTINE W REFLEX MICROSCOPIC  ?MAGNESIUM  ?ETHANOL  ?MAGNESIUM  ?COMPREHENSIVE METABOLIC PANEL  ?CBC WITH DIFFERENTIAL/PLATELET  ?PROTIME-INR  ?HEMOGLOBIN AND HEMATOCRIT, BLOOD  ?FERRITIN  ?FOLATE  ?IRON AND TIBC  ?LACTIC ACID, PLASMA  ?HEMOGLOBIN AND HEMATOCRIT, BLOOD  ?METHYLMALONIC ACID, SERUM  ?I-STAT BETA HCG BLOOD, ED ([facility], [facility], [facility] ONLY)  ?TYPE AND SCREEN  ?PREPARE RBC (CROSSMATCH)  ?PREPARE RBC (CROSSMATCH)  ? ? ?EKG ?None ? ?Radiology ?DG Chest 1 View ? ?Result Date: 08/31/2021 ?CLINICAL DATA:  GI bleeding. EXAM: CHEST  1 VIEW COMPARISON:  Chest x-ray 08/25/2021. FINDINGS: The heart size and  mediastinal contours are within normal limits. Both lungs are clear. Nipple shadow projects over the right lower lung. The visualized skeletal structures are unremarkable. IMPRESSION: No active disease. Electronically Signed   By: Ronney Asters M.D.   On: 08/31/2021 18:53  ? ?CT ANGIO GI BLEED ? ?Result Date: 08/31/2021 ?CLINICAL DATA:  GI bleeding EXAM: CTA ABDOMEN AND PELVIS WITHOUT AND WITH CONTRAST TECHNIQUE: Multidetector CT imaging of the abdomen and pelvis was performed using the standard protocol during bolus administration  of intravenous contrast. Multiplanar reconstructed images and MIPs were obtained and reviewed to evaluate the vascular anatomy. RADIATION DOSE REDUCTION: This exam was performed according to the departmental dose-optimization program which includes automated exposure control, adjustment of the mA and/or kV according to patient size and/or use of iterative reconstruction technique. CONTRAST:  126m OMNIPAQUE IOHEXOL 350 MG/ML SOLN COMPARISON:  CTA abdomen pelvis, 08/25/2021 FINDINGS: Examination is severely limited by photopenia/excessive CT dose modulation. VASCULAR Normal contour and caliber of the abdominal aorta. Moderate mixed calcific atherosclerosis. No evidence of aneurysm, dissection, or other acute aortic pathology. Standard branching pattern of the abdominal aorta with solitary bilateral renal arteries. Review of the MIP images confirms the above findings. NON-VASCULAR Lower chest: No acute abnormality. Hepatobiliary: Multiple low-attenuation lesions throughout the liver, as seen on recent prior examination. Largest lesion in the anterior liver, hepatic segment IV B, measuring 1.9 x 1.8 cm (series 13, image 23). Large burden of thrombus within the central portal vein (series 13, image 26) as well as the right portal vein and branches (series 13, image 21). Status post cholecystectomy. Mild postoperative biliary ductal dilatation. Pancreas: Unremarkable. No pancreatic ductal  dilatation or surrounding inflammatory changes. Spleen: Normal in size without significant abnormality. Adrenals/Urinary Tract: Adrenal glands are unremarkable. Kidneys are normal, without renal calculi, solid lesion, or

## 2021-08-31 NOTE — Assessment & Plan Note (Addendum)
--  Encourage cessation. UDS with cocaine ? ?

## 2021-08-31 NOTE — H&P (Signed)
?History and Physical  ? ? ?PLEASE NOTE THAT [employer] SOFTWARE WAS USED IN THE CONSTRUCTION OF THIS NOTE. ? ? ?Terri [name] IEP:[id number] DOB: May 08, 1976 DOA: 08/31/2021 ? ?PCP: Pcp, No (will further assess) ?Patient coming from: home  ? ?I have personally briefly reviewed patient's old medical records in [place] ? ?Chief Complaint: Dark-colored stool ? ?HPI: Terri [name] is a 46 y.o. female with medical history significant for acute upper GI bleed in the setting of peptic ulcer disease, acute lower GI bleed in 2021, essential hypertension, chronic blood loss anemia associated with baseline hemoglobin range 9-11, polysubstance abuse, portal vein thrombus, who is admitted to [facility] on 08/31/2021 with acute upper GI bleed after presenting from home to [facility] ED complaining of dark-colored stool.  ? ?Patient presents with complaint 2 episodes of dark-colored stool over the course the last 24 hours, which she states is new for her even relative to her baseline stool coloration while on chronic oral iron supplementation.  Denies any associated hematochezia.  She notes associated sharp, nonradiating epigastric discomfort in the absence of any preceding trauma.  She also conveys associated nausea in the absence of any vomiting, and specifically denies any recent hematemesis.  No recent diarrhea.  Over the course of the last 12 hours, she also reports mild shortness of breath and dizziness in the absence of any vertigo, presyncope, syncope, or fall.  Her shortness of breath is not associate any chest pain, palpitations, diaphoresis, nor any orthopnea, PND, or worsening peripheral edema.  Denies any cough or hemoptysis.  No new calf tenderness or new lower extremity erythema.  She also denies any associated subjective fever, chills, rigors, or generalized myalgias.  No recent dysuria or gross hematuria.  She notes no additional episodes of melena since arrival in the ED today. ? ?She has a history  of gastric bypass surgery, as well as history of acute upper gastrointestinal bleed, with chart review revealing documentation of ED presentation 04/21/2021 for hematemesis, at which time she was discharged from the emergency department with plan to follow-up with gastroenterology as an outpatient on PPI.  Per chart review, she also has a history of acute lower gastrointestinal bleed in 2021.  She has previously required blood transfusion.  It appears that most recent EGD occurred in August 2022. ? ?The patient denies any regular or recent consumption of alcohol, and denies any known history of underlying chronic liver disease. Denies any use of blood thinners as an outpatient, including no aspirin. No regular or recent use of NSAIDs. Also denies taking any of the following oral medications at home: systemic corticosteroids, supplemental potassium, bisphosphonate medications.  ? ?Per chart review, it appears that patient's baseline hemoglobin is 9-11, with most recent prior hemoglobin data points as follows: 9.4 on 08/27/2021, 8.5 on 08/19/2021, 11.0 on 07/08/2021.  ? ?Per my discussions with the EDP, she has a known history of portal vein thrombus, with imaging dating back to at least January 2023 demonstrating evidence of such.  As noted above, no recent blood thinners as an outpatient. ? ? ? ? ?ED Course:  ?Vital signs in the ED were notable for the following: Mildly hypothermic with temperature max 96.7, initial heart rate 85, which decreased to 80 following initiation of IV fluids, as further quantified below; initial blood pressure 100/61, subsequently increasing to 130/91 following IV fluids; respiratory rate 16-21, oxygen saturation 100% on room air. ? ?Labs were notable for the following: CMP notable for the following:  Sodium 132 compared to 133 on 08/27/2021, bicarbonate 23, BUN 72 compared to 27 on 08/27/2021, creatinine 0.88 compared to 0.76 on 08/27/2021, calcium, corrected for mild hypoalbuminemia noted to be  9.1, albumin, otherwise liver enzymes found to be within normal limits.  Lipase 37.  Quantitative beta-hCG negative.  Lactate 2.7.  CBC notable for white blood cell count 12,000, hemoglobin 4.6 associated with microcytic finding as well as normochromic finding and RDW elevation, platelet count 122, up slightly from 117 on 08/27/2021, INR 1.3.  Type and screen completed.  Rectal exam revealed dark-colored stool that was fecal occult blood positive.  Urinalysis ordered, with result currently pending. ? ?Imaging and additional notable ED work-up: Chest x-ray showed no evidence of acute cardiopulmonary process.  CT angio for GI bleed was performed, but exam was felt to be severely limited by photopenia but, within these limitations.  Showed no evidence of intraluminal contrast extravasation to localize GI bleed this imaging also showed unchanged thrombus within the central portal vein as well as the right portal vein, unchanged from imaging on 08/25/2021; additionally imaging showed multiple low-attenuation lesions within the liver, also unchanged from imaging from 08/25/2021. ? ?EDP discussed the patient's case with on-call [facility] gastroenterology, Dr. Therisa Doyne, Including elevated BUN to 72 and decline in hemoglobin from 9.4-4.6 since 08/27/2021.  Dr. Therisa Doyne To formally consult, recommending admission to the hospitalist service, n.p.o. status, with additional recommendations to follow. ? ?While in the ED, the following were administered: Protonix 80 mg IV x1 followed by initiation of Protonix drip; fentanyl 50 mcg IV x1, Zofran 4 mg IV x1, normal saline x2 L bolus followed by initiation of continuous NS at 125 cc/h.  Transfusion of 2 units.  BC were ordered, but have not yet been started. ? ?Subsequently, the patient was admitted to the stepdown unit for further evaluation and management of presenting acute upper gastrointestinal bleed complicated by acute on chronic blood loss anemia.  ? ? ? ? ?Review of Systems: As per HPI  otherwise 10 point review of systems negative.  ? ?Past Medical History:  ?Diagnosis Date  ? Arthritis   ? Back and legs  ? Chronic back pain   ? DVT (deep venous thrombosis) ([place])   ? early 20's leg  ? GIB (gastrointestinal bleeding) 09/13/2017  ? History of blood transfusion   ? Hypertension   ? Migraine headache   ? Mood disorder ([place])   ? previously documented as bipolar and MDD  ? Neuropathy   ? Pneumonia   ? 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 Sciatica   ? Stress incontinence   ? ? ?Past Surgical History:  ?Procedure Laterality Date  ? ALVEOLOPLASTY Bilateral 01/31/2018  ? Procedure: ALVEOLOPLASTY;  Surgeon: Diona Browner, DDS;  Location: [place];  Service: Oral Surgery;  Laterality: Bilateral;  ? ANKLE SURGERY Left 1992  ? with hardware  ? BACK SURGERY    ? BIOPSY  02/08/2019  ? Procedure: BIOPSY;  Surgeon: Irving Copas., MD;  Location: [facility];  Service: Gastroenterology;;  ? CESAREAN SECTION    ? CHOLECYSTECTOMY N/A 02/15/2017  ? Procedure: LAPAROSCOPIC CHOLECYSTECTOMY;  Surgeon: Clovis Riley, MD;  [facility]: [facility];  Service: General;  Laterality: N/A;  ? ENTEROSCOPY N/A 02/08/2019  ? Procedure: ENTEROSCOPY;  Surgeon: Rush Landmark Telford Nab., MD;  Location: [facility];  Service: Gastroenterology;  Laterality: N/A;  ? ESOPHAGOGASTRODUODENOSCOPY N/A 01/17/2021  ? Procedure: ESOPHAGOGASTRODUODENOSCOPY (EGD);  Surgeon: Juanita Craver, MD;  [facility];  Service: Endoscopy;  Laterality: N/A;  ? ESOPHAGOGASTRODUODENOSCOPY (EGD) WITH PROPOFOL N/A 09/15/2017  ? Procedure: ESOPHAGOGASTRODUODENOSCOPY (EGD) WITH PROPOFOL;  Surgeon: Clarene Essex, MD;  [facility];  Service: Endoscopy;  Laterality: N/A;  ? ESOPHAGOGASTRODUODENOSCOPY (EGD) WITH PROPOFOL N/A 02/08/2019  ? Procedure: ESOPHAGOGASTRODUODENOSCOPY (EGD) WITH PROPOFOL;  Surgeon: Rush Landmark Telford Nab., MD;  Location: [facility];  Service: Gastroenterology;  Laterality: N/A;  ? GASTRIC BYPASS    ? INCISION AND DRAINAGE OF  PERITONSILLAR ABCESS Left 08/13/2017  ? Procedure: INCISION AND DRAINAGE OF LEFT NECK ABSCESS;  Surgeon: Melida Quitter, MD;  [facility];  Service: ENT;  Laterality: Left;  ? LAPAROSCOPIC LYSIS OF ADHESIO

## 2021-08-31 NOTE — ED Triage Notes (Signed)
Ems brings pt in for GI bleeding. On scene there was a large BM that was black with red streaks per EMS. ?

## 2021-08-31 NOTE — ED Notes (Signed)
Patient transported to CT 

## 2021-08-31 NOTE — Assessment & Plan Note (Addendum)
--  secondary to GIB; stable ? ?

## 2021-08-31 NOTE — Progress Notes (Signed)
.  Transition of Care [facility]) - Emergency Department Mini Assessment ? ? ?Patient Details  ?Name: Terri [name] ?MRN: [id number] ?Date of Birth: [birth year] ? ?Transition of Care (TOC) CM/SW Contact:    ?Arlie Solomons Trendon Zaring, LCSW ?Phone Number: ?08/31/2021, 6:42 PM ? ? ?Clinical Narrative: ? ?TOC CSW received a consult in regard to transportation and medication assistance. Pt can receive 2 bus passes upon d/c. Pt is unable to receive medication assistance as pt is insured with Medicaid. Pt can also contact Social Services for transportation assistance to her doctor appointments as she has Medicaid in place. TOC sign off.  ? ?ED Mini Assessment: ?  ? ?Barriers to Discharge: Continued Medical Work up ? ?  ? ?  ? ?  ? ? ? ?Patient Contact and Communications ?  ?  ?  ? ,     ?  ?  ? ?  ?  ?  ? ?Admission diagnosis:  GI Bleed ?Patient Active Problem List  ? Diagnosis Date Noted  ? Community acquired pneumonia 07/08/2021  ? Homelessness 07/08/2021  ? Pneumonia of both lower lobes due to infectious organism   ? Cellulitis of right lower extremity 05/28/2021  ? Vitamin B12 deficiency 05/24/2021  ? Cellulitis 05/22/2021  ? COVID-19 virus infection 05/22/2021  ? Overdose 04/13/2021  ? Hypoxemia 04/13/2021  ? Hypoglycemia 04/13/2021  ? Asthma exacerbation 04/01/2021  ? Anemia 04/01/2021  ? Iron deficiency anemia 03/18/2021  ? Hyponatremia 03/18/2021  ? Marginal ulcer 03/15/2021  ? Microcytic anemia 02/26/2021  ? Mild protein malnutrition ([place]) 02/26/2021  ? Polysubstance abuse ([place]) 01/16/2021  ? Abdominal pain 01/14/2021  ? 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 Cocaine abuse ([place]) 01/13/2021  ? Heroin abuse ([place]) 01/13/2021  ? Hypertensive urgency 01/13/2021  ? Tobacco abuse 01/13/2021  ? SIRS (systemic inflammatory response syndrome) ([employer]) 01/13/2021  ? Acute pyelonephritis 02/11/2019  ? Sepsis due to Escherichia coli (E. coli) ([place]) 02/11/2019  ?  AKI (acute kidney injury) ([place])   ? Peptic ulcer disease 02/09/2019  ? 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 Persistent fever 02/09/2019  ? COVID-19 virus not detected   ? Chest pain   ? Bacteremia due to Escherichia coli   ? Esophagitis   ? Hypokalemia   ? Hypomagnesemia   ? Sepsis ([place]) 02/02/2019  ? Leucocytosis 02/02/2019  ? Thrombocytosis 02/02/2019  ? Acute lower UTI 02/02/2019  ? Apnea 09/13/2017  ? Essential hypertension   ? Cellulitis and abscess of neck 08/12/2017  ? Cellulitis of submandibular region 08/10/2017  ? Odontogenic infection of jaw 08/10/2017  ? Cholecystitis 02/14/2017  ? Opioid abuse with opioid-induced mood disorder ([place]) 01/25/2017  ? Anxiety 02/11/2014  ? Depression 07/12/2012  ? ?PCP:  Pcp, No ?Pharmacy:   ?[facility] ?[address] ?[place] [address] ?Phone: [phone] Fax: [phone] ? ?[facility] ?[place] [address] ?Phone: [phone] Fax: [phone] ?  ?

## 2021-08-31 NOTE — Assessment & Plan Note (Addendum)
Continue lisinopril, amlodipine, hydral ?Labetalol prn ?

## 2021-09-01 ENCOUNTER — Encounter (HOSPITAL_COMMUNITY): Payer: Self-pay | Admitting: Anesthesiology

## 2021-09-01 DIAGNOSIS — M7989 Other specified soft tissue disorders: Secondary | ICD-10-CM | POA: Diagnosis not present

## 2021-09-01 DIAGNOSIS — K7460 Unspecified cirrhosis of liver: Secondary | ICD-10-CM | POA: Diagnosis present

## 2021-09-01 DIAGNOSIS — C787 Secondary malignant neoplasm of liver and intrahepatic bile duct: Secondary | ICD-10-CM | POA: Diagnosis present

## 2021-09-01 DIAGNOSIS — F09 Unspecified mental disorder due to known physiological condition: Secondary | ICD-10-CM | POA: Diagnosis not present

## 2021-09-01 DIAGNOSIS — R7881 Bacteremia: Secondary | ICD-10-CM | POA: Diagnosis not present

## 2021-09-01 DIAGNOSIS — B3781 Candidal esophagitis: Secondary | ICD-10-CM | POA: Diagnosis present

## 2021-09-01 DIAGNOSIS — I38 Endocarditis, valve unspecified: Secondary | ICD-10-CM | POA: Diagnosis not present

## 2021-09-01 DIAGNOSIS — I96 Gangrene, not elsewhere classified: Secondary | ICD-10-CM | POA: Diagnosis not present

## 2021-09-01 DIAGNOSIS — I739 Peripheral vascular disease, unspecified: Secondary | ICD-10-CM | POA: Diagnosis not present

## 2021-09-01 DIAGNOSIS — I10 Essential (primary) hypertension: Secondary | ICD-10-CM | POA: Diagnosis present

## 2021-09-01 DIAGNOSIS — M549 Dorsalgia, unspecified: Secondary | ICD-10-CM | POA: Diagnosis not present

## 2021-09-01 DIAGNOSIS — K229 Disease of esophagus, unspecified: Secondary | ICD-10-CM | POA: Diagnosis not present

## 2021-09-01 DIAGNOSIS — R64 Cachexia: Secondary | ICD-10-CM | POA: Diagnosis present

## 2021-09-01 DIAGNOSIS — I81 Portal vein thrombosis: Secondary | ICD-10-CM | POA: Diagnosis present

## 2021-09-01 DIAGNOSIS — J810 Acute pulmonary edema: Secondary | ICD-10-CM | POA: Diagnosis not present

## 2021-09-01 DIAGNOSIS — J9601 Acute respiratory failure with hypoxia: Secondary | ICD-10-CM | POA: Diagnosis not present

## 2021-09-01 DIAGNOSIS — J181 Lobar pneumonia, unspecified organism: Secondary | ICD-10-CM | POA: Diagnosis not present

## 2021-09-01 DIAGNOSIS — E43 Unspecified severe protein-calorie malnutrition: Secondary | ICD-10-CM | POA: Diagnosis present

## 2021-09-01 DIAGNOSIS — K922 Gastrointestinal hemorrhage, unspecified: Secondary | ICD-10-CM | POA: Diagnosis not present

## 2021-09-01 DIAGNOSIS — D649 Anemia, unspecified: Secondary | ICD-10-CM | POA: Diagnosis not present

## 2021-09-01 DIAGNOSIS — R609 Edema, unspecified: Secondary | ICD-10-CM | POA: Diagnosis not present

## 2021-09-01 DIAGNOSIS — D62 Acute posthemorrhagic anemia: Secondary | ICD-10-CM | POA: Diagnosis not present

## 2021-09-01 DIAGNOSIS — Y95 Nosocomial condition: Secondary | ICD-10-CM | POA: Diagnosis not present

## 2021-09-01 DIAGNOSIS — D6959 Other secondary thrombocytopenia: Secondary | ICD-10-CM | POA: Diagnosis present

## 2021-09-01 DIAGNOSIS — A4101 Sepsis due to Methicillin susceptible Staphylococcus aureus: Secondary | ICD-10-CM | POA: Diagnosis not present

## 2021-09-01 DIAGNOSIS — F418 Other specified anxiety disorders: Secondary | ICD-10-CM | POA: Diagnosis not present

## 2021-09-01 DIAGNOSIS — K284 Chronic or unspecified gastrojejunal ulcer with hemorrhage: Secondary | ICD-10-CM | POA: Diagnosis not present

## 2021-09-01 DIAGNOSIS — M869 Osteomyelitis, unspecified: Secondary | ICD-10-CM | POA: Diagnosis not present

## 2021-09-01 DIAGNOSIS — A4901 Methicillin susceptible Staphylococcus aureus infection, unspecified site: Secondary | ICD-10-CM | POA: Diagnosis not present

## 2021-09-01 DIAGNOSIS — J90 Pleural effusion, not elsewhere classified: Secondary | ICD-10-CM | POA: Diagnosis present

## 2021-09-01 DIAGNOSIS — K921 Melena: Secondary | ICD-10-CM | POA: Diagnosis present

## 2021-09-01 DIAGNOSIS — J15212 Pneumonia due to Methicillin resistant Staphylococcus aureus: Secondary | ICD-10-CM | POA: Diagnosis not present

## 2021-09-01 DIAGNOSIS — J189 Pneumonia, unspecified organism: Secondary | ICD-10-CM | POA: Diagnosis not present

## 2021-09-01 DIAGNOSIS — K254 Chronic or unspecified gastric ulcer with hemorrhage: Secondary | ICD-10-CM | POA: Diagnosis present

## 2021-09-01 DIAGNOSIS — K750 Abscess of liver: Secondary | ICD-10-CM | POA: Diagnosis present

## 2021-09-01 DIAGNOSIS — R6521 Severe sepsis with septic shock: Secondary | ICD-10-CM | POA: Diagnosis not present

## 2021-09-01 DIAGNOSIS — Z7189 Other specified counseling: Secondary | ICD-10-CM | POA: Diagnosis not present

## 2021-09-01 DIAGNOSIS — J9602 Acute respiratory failure with hypercapnia: Secondary | ICD-10-CM | POA: Diagnosis not present

## 2021-09-01 DIAGNOSIS — Z8616 Personal history of COVID-19: Secondary | ICD-10-CM | POA: Diagnosis not present

## 2021-09-01 DIAGNOSIS — F1910 Other psychoactive substance abuse, uncomplicated: Secondary | ICD-10-CM | POA: Diagnosis not present

## 2021-09-01 DIAGNOSIS — F1124 Opioid dependence with opioid-induced mood disorder: Secondary | ICD-10-CM | POA: Diagnosis present

## 2021-09-01 DIAGNOSIS — K769 Liver disease, unspecified: Secondary | ICD-10-CM | POA: Diagnosis not present

## 2021-09-01 DIAGNOSIS — G9341 Metabolic encephalopathy: Secondary | ICD-10-CM | POA: Diagnosis not present

## 2021-09-01 DIAGNOSIS — Z515 Encounter for palliative care: Secondary | ICD-10-CM | POA: Diagnosis not present

## 2021-09-01 DIAGNOSIS — I708 Atherosclerosis of other arteries: Secondary | ICD-10-CM | POA: Diagnosis present

## 2021-09-01 DIAGNOSIS — J15211 Pneumonia due to Methicillin susceptible Staphylococcus aureus: Secondary | ICD-10-CM | POA: Diagnosis not present

## 2021-09-01 LAB — COMPREHENSIVE METABOLIC PANEL
ALT: 19 U/L (ref 0–44)
AST: 24 U/L (ref 15–41)
Albumin: 1.8 g/dL — ABNORMAL LOW (ref 3.5–5.0)
Alkaline Phosphatase: 110 U/L (ref 38–126)
Anion gap: 6 (ref 5–15)
BUN: 65 mg/dL — ABNORMAL HIGH (ref 6–20)
CO2: 20 mmol/L — ABNORMAL LOW (ref 22–32)
Calcium: 7.4 mg/dL — ABNORMAL LOW (ref 8.9–10.3)
Chloride: 107 mmol/L (ref 98–111)
Creatinine, Ser: 0.6 mg/dL (ref 0.44–1.00)
GFR, Estimated: >60 mL/min (ref >60)
Glucose, Bld: 46 mg/dL — ABNORMAL LOW (ref 70–99)
Potassium: 4 mmol/L (ref 3.5–5.1)
Sodium: 133 mmol/L — ABNORMAL LOW (ref 135–145)
Total Bilirubin: 1.6 mg/dL — ABNORMAL HIGH (ref 0.3–1.2)
Total Protein: 4.8 g/dL — ABNORMAL LOW (ref 6.5–8.1)

## 2021-09-01 LAB — HEMOGLOBIN AND HEMATOCRIT, BLOOD
HCT: 28 % — ABNORMAL LOW (ref 36.0–46.0)
Hemoglobin: 9.8 g/dL — ABNORMAL LOW (ref 12.0–15.0)

## 2021-09-01 LAB — FERRITIN: Ferritin: 115 ng/mL (ref 11–307)

## 2021-09-01 LAB — CBC WITH DIFFERENTIAL/PLATELET
Abs Immature Granulocytes: 0.08 10*3/uL — ABNORMAL HIGH (ref 0.00–0.07)
Basophils Absolute: 0 10*3/uL (ref 0.0–0.1)
Basophils Relative: 0 %
Eosinophils Absolute: 0 10*3/uL (ref 0.0–0.5)
Eosinophils Relative: 0 %
HCT: 27 % — ABNORMAL LOW (ref 36.0–46.0)
Hemoglobin: 9.6 g/dL — ABNORMAL LOW (ref 12.0–15.0)
Immature Granulocytes: 1 %
Lymphocytes Relative: 6 %
Lymphs Abs: 0.9 10*3/uL (ref 0.7–4.0)
MCH: 25.6 pg — ABNORMAL LOW (ref 26.0–34.0)
MCHC: 35.6 g/dL (ref 30.0–36.0)
MCV: 72 fL — ABNORMAL LOW (ref 80.0–100.0)
Monocytes Absolute: 0.5 10*3/uL (ref 0.1–1.0)
Monocytes Relative: 3 %
Neutro Abs: 14.4 10*3/uL — ABNORMAL HIGH (ref 1.7–7.7)
Neutrophils Relative %: 90 %
Platelets: 96 10*3/uL — ABNORMAL LOW (ref 150–400)
RBC: 3.75 MIL/uL — ABNORMAL LOW (ref 3.87–5.11)
RDW: 23.5 % — ABNORMAL HIGH (ref 11.5–15.5)
WBC: 15.8 10*3/uL — ABNORMAL HIGH (ref 4.0–10.5)
nRBC: 0 % (ref 0.0–0.2)

## 2021-09-01 LAB — PROTIME-INR
INR: 1.2 (ref 0.8–1.2)
Prothrombin Time: 15.2 seconds (ref 11.4–15.2)

## 2021-09-01 LAB — GLUCOSE, CAPILLARY
Glucose-Capillary: 47 mg/dL — ABNORMAL LOW (ref 70–99)
Glucose-Capillary: 100 mg/dL — ABNORMAL HIGH (ref 70–99)

## 2021-09-01 LAB — MAGNESIUM: Magnesium: 2 mg/dL (ref 1.7–2.4)

## 2021-09-01 LAB — LACTIC ACID, PLASMA: Lactic Acid, Venous: 0.7 mmol/L (ref 0.5–1.9)

## 2021-09-01 LAB — IRON AND TIBC
Iron: 19 ug/dL — ABNORMAL LOW (ref 28–170)
Saturation Ratios: 10 % — ABNORMAL LOW (ref 10.4–31.8)
TIBC: 196 ug/dL — ABNORMAL LOW (ref 250–450)
UIBC: 177 ug/dL

## 2021-09-01 LAB — FOLATE: Folate: 5.4 ng/mL — ABNORMAL LOW (ref >5.9)

## 2021-09-01 MED ORDER — LABETALOL HCL 5 MG/ML IV SOLN
10.0000 mg | INTRAVENOUS | Status: DC | PRN
  Administered 2021-09-01 – 2021-09-02 (×3): 10 mg via INTRAVENOUS
  Filled 2021-09-01 (×2): qty 4

## 2021-09-01 MED ORDER — LABETALOL HCL 5 MG/ML IV SOLN
INTRAVENOUS | Status: AC
  Filled 2021-09-01: qty 4

## 2021-09-01 MED ORDER — CHLORHEXIDINE GLUCONATE CLOTH 2 % EX PADS
6.0000 | MEDICATED_PAD | Freq: Every day | CUTANEOUS | Status: DC
  Administered 2021-09-02 – 2021-09-15 (×12): 6 via TOPICAL

## 2021-09-01 MED ORDER — HYDROMORPHONE HCL 1 MG/ML IJ SOLN
0.5000 mg | Freq: Once | INTRAMUSCULAR | Status: AC
  Administered 2021-09-01: 0.5 mg via INTRAVENOUS
  Filled 2021-09-01: qty 1

## 2021-09-01 MED ORDER — LIP MEDEX EX OINT
TOPICAL_OINTMENT | CUTANEOUS | Status: DC | PRN
  Administered 2021-09-01 – 2021-10-08 (×2): 75 via TOPICAL
  Filled 2021-09-01 (×5): qty 7

## 2021-09-01 MED ORDER — DEXTROSE 50 % IV SOLN: 1.0000 | Freq: Once | INTRAVENOUS | Status: AC

## 2021-09-01 MED ORDER — SODIUM CHLORIDE 0.9 % IV SOLN: INTRAVENOUS | Status: DC

## 2021-09-01 MED ORDER — DEXTROSE 50 % IV SOLN
INTRAVENOUS | Status: AC
  Administered 2021-09-01: 50 mL via INTRAVENOUS
  Filled 2021-09-01: qty 50

## 2021-09-01 NOTE — Progress Notes (Signed)
Patient continually removing warming blanket, despite core temp of 35.4 and educating patient on the dangers of refusing such care patient is refusing.  ? ?Patient room temp has been turned up to the highest setting and warm blanket applied.  ? ?[facility] floor coverage notified.   ?

## 2021-09-01 NOTE — Anesthesia Preprocedure Evaluation (Deleted)
Anesthesia Evaluation  ? ? ?Reviewed: ?Allergy & Precautions, Patient's Chart, lab work & pertinent test results ? ?History of Anesthesia Complications ?Negative for: history of anesthetic complications ? ?Airway ? ? ? ? ? ? ? Dental ? ?(+) Edentulous Upper, Edentulous Lower ?  ?Pulmonary ?Current Smoker and Patient abstained from smoking.,  ?  ? ? ? ? ? ? ? Cardiovascular ?hypertension, Pt. on medications ?+ DVT  ? ? ?ECG: SR, rate 80 ?  ?Neuro/Psych ? Headaches, PSYCHIATRIC DISORDERS Anxiety Depression Bipolar Disorder   ? GI/Hepatic ?PUD, (+)  ?  ? substance abuse ? ,   ?Endo/Other  ?negative endocrine ROS ? Renal/GU ?negative Renal ROS  ? ?  ?Musculoskeletal ?negative musculoskeletal ROS ?(+)  ? Abdominal ?  ?Peds ? Hematology ? ?(+) Blood dyscrasia, anemia ,   ?Anesthesia Other Findings ?Anastomotic ulcer ? Reproductive/Obstetrics ? ?  ? ? ? ? ? ? ? ? ? ? ? ? ? ?  ?  ? ? ? ? ? ? ? ? ?Anesthesia Physical ? ?Anesthesia Plan ? ?ASA: 3 ? ?Anesthesia Plan: MAC  ? ?Post-op Pain Management: Minimal or no pain anticipated  ? ?Induction:  ? ?PONV Risk Score and Plan: 1 and Ondansetron and Propofol infusion ? ?Airway Management Planned: Natural Airway ? ?Additional Equipment:  ? ?Intra-op Plan:  ? ?Post-operative Plan:  ? ?Informed Consent:  ? ?Plan Discussed with: Anesthesiologist and CRNA ? ?Anesthesia Plan Comments: (Cancelled due to BP)  ? ? ? ? ?Anesthesia Quick Evaluation ? ?

## 2021-09-01 NOTE — H&P (View-Only) (Signed)
Referring Provider: Vanna Scotland, MD ?Primary Care Physician:  Pcp, No ?Primary Gastroenterologist:  Althia Forts ? ?Reason for Consultation:  Upper GI bleed, melena ? ?HPI: Terri [name] is a 46 y.o. female with medical history significant for acute upper GI bleed in the setting of peptic ulcer disease, HTN, chronic blood loss anemia associated with baseline hemoglobin range 9-11, polysubstance abuse, portal vein thrombus, admitted to [facility] on 08/31/2021 with acute upper GI bleed after presenting from home to [facility] ED complaining of dark-colored stool. Hgb 4.6 in the ED with rectal exam revealing dark stool, FOBT positive. Patient received IV Protonix in the ED and was started on Protonix drip. 3 units PRBCs ordered. ? ?She has a history of gastric bypass surgery, as well as history of acute upper gastrointestinal bleed, with chart review revealing documentation of ED presentation 04/21/2021 for hematemesis, at which time she was discharged from the emergency department with plan to follow-up with gastroenterology as an outpatient on PPI. She has previously required blood transfusion.  ? ?Patient noticed dark stools with 3 bowel movements yesterday, 08/31/2021.  Also reports onset of sharp/burning epigastric pain yesterday.  Patient is on chronic oral iron supplementation but stools were darker than normal.  Patient denies taking PPI at home and denies blood thinner use.  Denies alcohol use or NSAIDs but smokes 1 pack of cigarettes per day.  History of gastric bypass surgery 2013. ? ?Patient denies family history of GI malignancy or disease.  Denies previous colonoscopy.  States she has not seen GI outpatient. ? ?Denies MI/stroke history. ? ?EGD 01/17/2021 showing Roux-en-Y gastrojejunostomy with a giant ulcer at the gastrojejunal anastomosis without evidence of hemorrhage or ulceration ? ?EGD 02/08/2019 showed LA grade a esophagitis.  Dilation performed in the entire esophagus.  Nonbleeding gastric ulcer with  a clean ulcer base is seen in region of previous gastric surgery. ? ? ?Past Medical History:  ?Diagnosis Date  ? Arthritis   ? Back and legs  ? Chronic back pain   ? DVT (deep venous thrombosis) ([place])   ? early 20's leg  ? GIB (gastrointestinal bleeding) 09/13/2017  ? History of blood transfusion   ? Hypertension   ? Migraine headache   ? Mood disorder ([place])   ? previously documented as bipolar and MDD  ? Neuropathy   ? Pneumonia   ? 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 Sciatica   ? Stress incontinence   ? ? ?Past Surgical History:  ?Procedure Laterality Date  ? ALVEOLOPLASTY Bilateral 01/31/2018  ? Procedure: ALVEOLOPLASTY;  Surgeon: Diona Browner, DDS;  Location: [facility];  Service: Oral Surgery;  Laterality: Bilateral;  ? ANKLE SURGERY Left 1992  ? with hardware  ? BACK SURGERY    ? BIOPSY  02/08/2019  ? Procedure: BIOPSY;  Surgeon: Irving Copas., MD;  Location: [facility];  Service: Gastroenterology;;  ? CESAREAN SECTION    ? CHOLECYSTECTOMY N/A 02/15/2017  ? Procedure: LAPAROSCOPIC CHOLECYSTECTOMY;  Surgeon: Clovis Riley, MD;  [facility];  Service: General;  Laterality: N/A;  ? ENTEROSCOPY N/A 02/08/2019  ? Procedure: ENTEROSCOPY;  Surgeon: Rush Landmark Telford Nab., MD;  Location: [facility];  Service: Gastroenterology;  Laterality: N/A;  ? ESOPHAGOGASTRODUODENOSCOPY N/A 01/17/2021  ? Procedure: ESOPHAGOGASTRODUODENOSCOPY (EGD);  Surgeon: Juanita Craver, MD;  Location: [facility];  Service: Endoscopy;  Laterality: N/A;  ? ESOPHAGOGASTRODUODENOSCOPY (EGD) WITH PROPOFOL N/A 09/15/2017  ? Procedure: ESOPHAGOGASTRODUODENOSCOPY (EGD) WITH PROPOFOL;  Surgeon: Clarene Essex, MD;  [facility];  Service: Endoscopy;  Laterality: N/A;  ? ESOPHAGOGASTRODUODENOSCOPY (EGD) WITH PROPOFOL N/A 02/08/2019  ? Procedure: ESOPHAGOGASTRODUODENOSCOPY (EGD) WITH PROPOFOL;  Surgeon: Rush Landmark Telford Nab., MD;  Location: [facility];  Service: Gastroenterology;  Laterality: N/A;  ? GASTRIC BYPASS    ?  INCISION AND DRAINAGE OF PERITONSILLAR ABCESS Left 08/13/2017  ? Procedure: INCISION AND DRAINAGE OF LEFT NECK ABSCESS;  Surgeon: Melida Quitter, MD;  [facility];  Service: ENT;  Laterality: Left;  ? LAPAROSCOPIC LYSIS OF ADHESIONS  02/15/2017  ? Procedure: LAPAROSCOPIC LYSIS OF ADHESIONS;  Surgeon: Clovis Riley, MD;  [facility];  Service: General;;  ? LUMBAR [place]    ? LUMBAR FUSION    ? SAVORY DILATION N/A 02/08/2019  ? Procedure: SAVORY DILATION;  Surgeon: Irving Copas., MD;  Location: [facility];  Service: Gastroenterology;  Laterality: N/A;  ? TOOTH EXTRACTION Bilateral 01/31/2018  ? Procedure: DENTAL RESTORATION/EXTRACTIONS;  Surgeon: Diona Browner, DDS;  Location: [place];  Service: Oral Surgery;  Laterality: Bilateral;  ? TUBAL LIGATION    ? UPPER GI ENDOSCOPY N/A 02/15/2017  ? Procedure: UPPER GI ENDOSCOPY;  Surgeon: Clovis Riley, MD;  [facility];  Service: General;  Laterality: N/A;  ? ? ?Prior to Admission medications   ?Medication Sig Start Date End Date Taking? Authorizing Provider  ?acetaminophen (TYLENOL) 500 MG tablet Take 2 tablets (1,000 mg total) by mouth every 8 (eight) hours as needed for mild pain. 05/29/21   Mariel Aloe, MD  ?amLODipine (NORVASC) 10 MG tablet Take 1 tablet (10 mg total) by mouth daily. 05/30/21 08/28/21  Jacqlyn Larsen, PA-C  ?Ferrous Fumarate (IRON) 18 MG TBCR Take 1 tablet (18 mg total) by mouth 2 (two) times daily after a meal. 04/06/21   Domenic Polite, MD  ?hydrALAZINE (APRESOLINE) 50 MG tablet Take 1 tablet (50 mg total) by mouth 2 (two) times daily. 05/30/21 08/28/21  Jacqlyn Larsen, PA-C  ?hydrOXYzine (ATARAX/VISTARIL) 25 MG tablet Take 1 tablet (25 mg total) by mouth every 8 (eight) hours as needed for anxiety. 04/06/21   Domenic Polite, MD  ?lisinopril (ZESTRIL) 10 MG tablet Take 1 tablet (10 mg total) by mouth daily. 05/30/21 08/28/21  Jacqlyn Larsen, PA-C  ?nicotine (NICODERM CQ - DOSED IN MG/24 HOURS) 21 mg/24hr patch Place 1 patch  (21 mg total) onto the skin daily. 04/07/21   Domenic Polite, MD  ?ondansetron (ZOFRAN-ODT) 4 MG disintegrating tablet Take 1 tablet (4 mg total) by mouth every 8 (eight) hours as needed for nausea or vomiting. 04/21/21   Tedd Sias, PA  ?pantoprazole (PROTONIX) 40 MG tablet Take 1 tablet (40 mg total) by mouth daily. 03/18/21   Danford, Suann Larry, MD  ?vitamin B-12 (CYANOCOBALAMIN) 1000 MCG tablet Take 1 tablet (1,000 mcg total) by mouth daily. ?Patient not taking: Reported on 07/04/2021 04/06/21   Domenic Polite, MD  ? ? ?Scheduled Meds: ? Chlorhexidine Gluconate Cloth  6 each Topical Daily  ? ?Continuous Infusions: ? sodium chloride 125 mL/hr at 09/01/21 0700  ? sodium chloride Stopped (08/31/21 2149)  ? pantoprazole 8 mg/hr (09/01/21 0700)  ? ?PRN Meds:.acetaminophen **OR** acetaminophen, fentaNYL (SUBLIMAZE) injection, lip balm, naLOXone (NARCAN)  injection, ondansetron (ZOFRAN) IV ? ?Allergies as of 08/31/2021 - Review Complete 08/27/2021  ?Allergen Reaction Noted  ? Nsaids Other (See Comments) 04/02/2021  ? Ketorolac tromethamine Itching and Nausea And Vomiting 12/06/2010  ? ? ?Family History  ?Problem Relation Age of Onset  ? Diabetes Mother   ?  Hypertension Mother   ? Diabetes Father   ? Hypertension Father   ? ? ?Social History  ? ?Socioeconomic History  ? Marital status: Divorced  ?  Spouse name: Not on file  ? Number of children: Not on file  ? Years of education: Not on file  ? Highest education level: Not on file  ?Occupational History  ? Not on file  ?Tobacco Use  ? Smoking status: Every Day  ?  Packs/day: 1.00  ?  Years: 29.00  ?  Pack years: 29.00  ?  Types: Cigarettes  ? Smokeless tobacco: Never  ?Vaping Use  ? Vaping Use: Never used  ?Substance and Sexual Activity  ? Alcohol use: No  ? Drug use: Yes  ?  Types: Cocaine, Heroin  ?  Comment: Patient states she smokes cocaine  ? Sexual activity: Yes  ?  Birth control/protection: None  ?Other Topics Concern  ? Not on file  ?Social History  Narrative  ? Not on file  ? ?Social Determinants of Health  ? ?Financial Resource Strain: Not on file  ?Food Insecurity: Not on file  ?Transportation Needs: Not on file  ?Physical Activity: Not on file  ?Str

## 2021-09-01 NOTE — Consult Note (Signed)
Referring Provider: Vanna Scotland, MD ?Primary Care Physician:  Pcp, No ?Primary Gastroenterologist:  Althia Forts ? ?Reason for Consultation:  Upper GI bleed, melena ? ?HPI: Terri [name] is a 46 y.o. female with medical history significant for acute upper GI bleed in the setting of peptic ulcer disease, HTN, chronic blood loss anemia associated with baseline hemoglobin range 9-11, polysubstance abuse, portal vein thrombus, admitted to [facility] on 08/31/2021 with acute upper GI bleed after presenting from home to [facility] ED complaining of dark-colored stool. Hgb 4.6 in the ED with rectal exam revealing dark stool, FOBT positive. Patient received IV Protonix in the ED and was started on Protonix drip. 3 units PRBCs ordered. ? ?She has a history of gastric bypass surgery, as well as history of acute upper gastrointestinal bleed, with chart review revealing documentation of ED presentation 04/21/2021 for hematemesis, at which time she was discharged from the emergency department with plan to follow-up with gastroenterology as an outpatient on PPI. She has previously required blood transfusion.  ? ?Patient noticed dark stools with 3 bowel movements yesterday, 08/31/2021.  Also reports onset of sharp/burning epigastric pain yesterday.  Patient is on chronic oral iron supplementation but stools were darker than normal.  Patient denies taking PPI at home and denies blood thinner use.  Denies alcohol use or NSAIDs but smokes 1 pack of cigarettes per day.  History of gastric bypass surgery 2013. ? ?Patient denies family history of GI malignancy or disease.  Denies previous colonoscopy.  States she has not seen GI outpatient. ? ?Denies MI/stroke history. ? ?EGD 01/17/2021 showing Roux-en-Y gastrojejunostomy with a giant ulcer at the gastrojejunal anastomosis without evidence of hemorrhage or ulceration ? ?EGD 02/08/2019 showed LA grade a esophagitis.  Dilation performed in the entire esophagus.  Nonbleeding gastric ulcer with  a clean ulcer base is seen in region of previous gastric surgery. ? ? ?Past Medical History:  ?Diagnosis Date  ? Arthritis   ? Back and legs  ? Chronic back pain   ? DVT (deep venous thrombosis) ([place])   ? early 20's leg  ? GIB (gastrointestinal bleeding) 09/13/2017  ? History of blood transfusion   ? Hypertension   ? Migraine headache   ? Mood disorder ([place])   ? previously documented as bipolar and MDD  ? Neuropathy   ? Pneumonia   ? 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 Sciatica   ? Stress incontinence   ? ? ?Past Surgical History:  ?Procedure Laterality Date  ? ALVEOLOPLASTY Bilateral 01/31/2018  ? Procedure: ALVEOLOPLASTY;  Surgeon: Diona Browner, DDS;  Location: [place];  Service: Oral Surgery;  Laterality: Bilateral;  ? ANKLE SURGERY Left 1992  ? with hardware  ? BACK SURGERY    ? BIOPSY  02/08/2019  ? Procedure: BIOPSY;  Surgeon: Irving Copas., MD;  Location: [facility];  Service: Gastroenterology;;  ? CESAREAN SECTION    ? CHOLECYSTECTOMY N/A 02/15/2017  ? Procedure: LAPAROSCOPIC CHOLECYSTECTOMY;  Surgeon: Clovis Riley, MD;  Location: [facility];  Service: General;  Laterality: N/A;  ? ENTEROSCOPY N/A 02/08/2019  ? Procedure: ENTEROSCOPY;  Surgeon: Rush Landmark Telford Nab., MD;  Location: [facility];  Service: Gastroenterology;  Laterality: N/A;  ? ESOPHAGOGASTRODUODENOSCOPY N/A 01/17/2021  ? Procedure: ESOPHAGOGASTRODUODENOSCOPY (EGD);  Surgeon: Juanita Craver, MD;  Location: [facility];  Service: Endoscopy;  Laterality: N/A;  ? ESOPHAGOGASTRODUODENOSCOPY (EGD) WITH PROPOFOL N/A 09/15/2017  ? Procedure: ESOPHAGOGASTRODUODENOSCOPY (EGD) WITH PROPOFOL;  Surgeon: Clarene Essex, MD;  Location:  [facility];  Service: Endoscopy;  Laterality: N/A;  ? ESOPHAGOGASTRODUODENOSCOPY (EGD) WITH PROPOFOL N/A 02/08/2019  ? Procedure: ESOPHAGOGASTRODUODENOSCOPY (EGD) WITH PROPOFOL;  Surgeon: Rush Landmark Telford Nab., MD;  Location: [facility];  Service: Gastroenterology;  Laterality: N/A;  ? GASTRIC BYPASS    ?  INCISION AND DRAINAGE OF PERITONSILLAR ABCESS Left 08/13/2017  ? Procedure: INCISION AND DRAINAGE OF LEFT NECK ABSCESS;  Surgeon: Melida Quitter, MD;  [facility];  Service: ENT;  Laterality: Left;  ? LAPAROSCOPIC LYSIS OF ADHESIONS  02/15/2017  ? Procedure: LAPAROSCOPIC LYSIS OF ADHESIONS;  Surgeon: Clovis Riley, MD;  Location: [facility];  Service: General;;  ? LUMBAR [place]    ? LUMBAR FUSION    ? SAVORY DILATION N/A 02/08/2019  ? Procedure: SAVORY DILATION;  Surgeon: Irving Copas., MD;  Location: [facility];  Service: Gastroenterology;  Laterality: N/A;  ? TOOTH EXTRACTION Bilateral 01/31/2018  ? Procedure: DENTAL RESTORATION/EXTRACTIONS;  Surgeon: Diona Browner, DDS;  Location: [place];  Service: Oral Surgery;  Laterality: Bilateral;  ? TUBAL LIGATION    ? UPPER GI ENDOSCOPY N/A 02/15/2017  ? Procedure: UPPER GI ENDOSCOPY;  Surgeon: Clovis Riley, MD;  Location: [facility];  Service: General;  Laterality: N/A;  ? ? ?Prior to Admission medications   ?Medication Sig Start Date End Date Taking? Authorizing Provider  ?acetaminophen (TYLENOL) 500 MG tablet Take 2 tablets (1,000 mg total) by mouth every 8 (eight) hours as needed for mild pain. 05/29/21   Mariel Aloe, MD  ?amLODipine (NORVASC) 10 MG tablet Take 1 tablet (10 mg total) by mouth daily. 05/30/21 08/28/21  Jacqlyn Larsen, PA-C  ?Ferrous Fumarate (IRON) 18 MG TBCR Take 1 tablet (18 mg total) by mouth 2 (two) times daily after a meal. 04/06/21   Domenic Polite, MD  ?hydrALAZINE (APRESOLINE) 50 MG tablet Take 1 tablet (50 mg total) by mouth 2 (two) times daily. 05/30/21 08/28/21  Jacqlyn Larsen, PA-C  ?hydrOXYzine (ATARAX/VISTARIL) 25 MG tablet Take 1 tablet (25 mg total) by mouth every 8 (eight) hours as needed for anxiety. 04/06/21   Domenic Polite, MD  ?lisinopril (ZESTRIL) 10 MG tablet Take 1 tablet (10 mg total) by mouth daily. 05/30/21 08/28/21  Jacqlyn Larsen, PA-C  ?nicotine (NICODERM CQ - DOSED IN MG/24 HOURS) 21 mg/24hr patch Place 1 patch  (21 mg total) onto the skin daily. 04/07/21   Domenic Polite, MD  ?ondansetron (ZOFRAN-ODT) 4 MG disintegrating tablet Take 1 tablet (4 mg total) by mouth every 8 (eight) hours as needed for nausea or vomiting. 04/21/21   Tedd Sias, PA  ?pantoprazole (PROTONIX) 40 MG tablet Take 1 tablet (40 mg total) by mouth daily. 03/18/21   Danford, Suann Larry, MD  ?vitamin B-12 (CYANOCOBALAMIN) 1000 MCG tablet Take 1 tablet (1,000 mcg total) by mouth daily. ?Patient not taking: Reported on 07/04/2021 04/06/21   Domenic Polite, MD  ? ? ?Scheduled Meds: ? Chlorhexidine Gluconate Cloth  6 each Topical Daily  ? ?Continuous Infusions: ? sodium chloride 125 mL/hr at 09/01/21 0700  ? sodium chloride Stopped (08/31/21 2149)  ? pantoprazole 8 mg/hr (09/01/21 0700)  ? ?PRN Meds:.acetaminophen **OR** acetaminophen, fentaNYL (SUBLIMAZE) injection, lip balm, naLOXone (NARCAN)  injection, ondansetron (ZOFRAN) IV ? ?Allergies as of 08/31/2021 - Review Complete 08/27/2021  ?Allergen Reaction Noted  ? Nsaids Other (See Comments) 04/02/2021  ? Ketorolac tromethamine Itching and Nausea And Vomiting 12/06/2010  ? ? ?Family History  ?Problem Relation Age of Onset  ? Diabetes Mother   ?  Hypertension Mother   ? Diabetes Father   ? Hypertension Father   ? ? ?Social History  ? ?Socioeconomic History  ? Marital status: Divorced  ?  Spouse name: Not on file  ? Number of children: Not on file  ? Years of education: Not on file  ? Highest education level: Not on file  ?Occupational History  ? Not on file  ?Tobacco Use  ? Smoking status: Every Day  ?  Packs/day: 1.00  ?  Years: 29.00  ?  Pack years: 29.00  ?  Types: Cigarettes  ? Smokeless tobacco: Never  ?Vaping Use  ? Vaping Use: Never used  ?Substance and Sexual Activity  ? Alcohol use: No  ? Drug use: Yes  ?  Types: Cocaine, Heroin  ?  Comment: Patient states she smokes cocaine  ? Sexual activity: Yes  ?  Birth control/protection: None  ?Other Topics Concern  ? Not on file  ?Social History  Narrative  ? Not on file  ? ?Social Determinants of Health  ? ?Financial Resource Strain: Not on file  ?Food Insecurity: Not on file  ?Transportation Needs: Not on file  ?Physical Activity: Not on file  ?Str

## 2021-09-01 NOTE — Hospital Course (Addendum)
46 year old woman admitted for acute GIB. EGD showed esophageal plaques consistent with candidiasis, large cratered nonbleeding duodenal ulcer with no stigmata of bleeding.  CT angiogram showed multiple low-attenuation lesions throughout the liver seen on recent prior exam, highly concerning for hepatic abscesses or metastasis.  S/p IR US guided bx and aspiration, revealing MSSA abscess. 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  4/28 with worsening hypoxia, CT w/ severe right-sided multilobar pneumonia, transferred to SDU. Seen by psychiatry, IVC placed for lack of capacity. ?

## 2021-09-01 NOTE — Progress Notes (Signed)
?Progress Note ? ? ?Patient: Terri [name]:[id number] DOB: [birth year] DOA: 08/31/2021     0 ?DOS: the patient was seen and examined on 09/01/2021 ?  ?Brief hospital course: ?46 y.o. female with medical history significant for acute upper GI bleed in the setting of peptic ulcer disease, acute lower GI bleed in 2021, essential hypertension, chronic blood loss anemia associated with baseline hemoglobin range 9-11, polysubstance abuse, portal vein thrombus, who is admitted to [facility] on 08/31/2021 with acute upper GI bleed after presenting from home to [facility] ED complaining of dark-colored stool.  ?  ?Patient presents with complaint 2 episodes of dark-colored stool over the course the last 24 hours, which she states is new for her even relative to her baseline stool coloration while on chronic oral iron supplementation.  Denies any associated hematochezia.  She notes associated sharp, nonradiating epigastric discomfort in the absence of any preceding trauma.  She also conveys associated nausea in the absence of any vomiting, and specifically denies any recent hematemesis.  No recent diarrhea.  Over the course of the last 12 hours, she also reports mild shortness of breath and dizziness in the absence of any vertigo, presyncope, syncope, or fall.  Her shortness of breath is not associate any chest pain, palpitations, diaphoresis, nor any orthopnea, PND, or worsening peripheral edema.  Denies any cough or hemoptysis.  No new calf tenderness or new lower extremity erythema.  She also denies any associated subjective fever, chills, rigors, or generalized myalgias.  No recent dysuria or gross hematuria.  She notes no additional episodes of melena since arrival in the ED today. ?  ?She has a history of gastric bypass surgery, as well as history of acute upper gastrointestinal bleed, with chart review revealing documentation of ED presentation 04/21/2021 for hematemesis, at which time she was discharged from the  emergency department with plan to follow-up with gastroenterology as an outpatient on PPI.  Per chart review, she also has a history of acute lower gastrointestinal bleed in 2021.  She has previously required blood transfusion.  It appears that most recent EGD occurred in August 2022. ?  ?The patient denies any regular or recent consumption of alcohol, and denies any known history of underlying chronic liver disease. Denies any use of blood thinners as an outpatient, including no aspirin. No regular or recent use of NSAIDs. Also denies taking any of the following oral medications at home: systemic corticosteroids, supplemental potassium, bisphosphonate medications.  ?  ?Per chart review, it appears that patient's baseline hemoglobin is 9-11, with most recent prior hemoglobin data points as follows: 9.4 on 08/27/2021, 8.5 on 08/19/2021, 11.0 on 07/08/2021.  ?  ?Per my discussions with the EDP, she has a known history of portal vein thrombus, with imaging dating back to at least January 2023 demonstrating evidence of such.  As noted above, no recent blood thinners as an outpatient. ? ?4/11 ?Improving H&H s/p transfusion; ?Has appetite ?GI plans to do EGD in AM. ?Prognosis guarded. ? ?Assessment and Plan: ?* Acute upper GI bleed ?- 2 episodes of melena over the course the last day,  ?- elevated BUN of 72 compared to value of 27 on 08/27/2021 ?- DRE revealing melena with Hemoccult positive stool.  ?- acute on chronic blood loss anemia, with presenting Hgb noted to be 4.6 compared to most recent prior value of 9.4 on 08/27/2021.  ? ?Continue NPO.  ?Hold DVT ppx ?Continue telemetry. ?GI plan on EGD in AM. ?Q4H H&H's have been  ordered through 9 AM on 09/01/2021. Will closely monitor these ensuing Hgb levels and correlate these data points with the patient's overall clinical picture including vital signs to determine need for subsequent transfusion beyond the existing orders to transfuse 3 units PRBC.  ?On-call GI consulted, as  above, with additional recs pending at this time. Further evaluation and management of corresponding acute blood loss anemia, including adding-on iron studies to pre-transfusion specimen, as further detailed below. CMP in the AM. Protonix drip.  Rocephin 1 g IV x1 for SBP prophylaxis, as above.  Holding home oral iron supplementation for now.  Prn IV fentanyl for epigastric pain.   ? ?Acute on chronic blood loss anemia ?#) Acute on chronic blood loss anemia: in the setting of suspected acute upper GI bleed, presenting Hgb noted to be 4.6, which is relative to most recent prior Hgb value of 9.4 on 08/27/2021, and relative to known history of chronic iron deficiency anemia associated with baseline hemoglobin range of 9-11. At this time, patient appears hemodynamically stable and asymptomatic aside from mild epigastric discomfort, as further described above.  Type and screen completed in the ED today, with initial EDP orders for transfusion of 2 units PRBC, which have not yet been started.  I have ordered a 3rd unit prbc to be transfused, with rationale as above.   ? ?- GI plans EGD tomorrow AM, NPO past MN. ?Continue Q4H H&H, transfusion goal >7  ?Monitor on telemetry. Monitor continuous pulse-ox.  ?Refraining from pharmacologic DVT prophylaxis.   ?  ? ?Hepatic lesion ? ? ?#) Multiple low-attenuation hepatic lesions: Known history of such; this evening CT angio for GI bleed demonstrates multiple low-attenuation hepatic lesions unclear etiology, unchanged from most recent prior imaging, as further detailed above.  ? ?Plan: Gastroenterology consulted, as above. ? ? ? ? ? ? ?Lactic acidosis ? ? ?#) Lactic acidosis: Initial lactate noted to be 2.7.  Suspect that this is on the basis of relative generalized tissue hypoperfusion as a consequence of decline in oxygen delivery capacity in the setting of presenting acute on chronic blood loss anemia, as further quantified above.  While SIRS criteria are met with mild  leukocytosis as well as mild tachypnea, it appears that these factors are present on the basis of inflammatory/hemoconcentrated effects as well as ensuing implications of presenting acute blood loss anemia in the setting of acute upper gastrointestinal bleed.  No evidence of underlying infectious process at this time.  Therefore, criteria for sepsis not currently met.  Of note, chest ray shows evidence of acute cardiopulmonary process.  Urinalysis result currently pending.  The patient is currently receiving normal saline at 125 cc/h leading up to initiation of PRBC transfusion, and will repeat lactate as indicated below. ? ?Plan: Further evaluation management of acute on chronic blood loss anemia in the setting of acute upper gastrointestinal bleed, including plan for PRBC transfusion, as further detailed above.  Continuous and assess, as above.  Repeat lactate at midnight on 09/01/21.  Repeat INR in the morning.  Check urinalysis.  Add on serum ethanol level.  Repeat CMP in the morning. ? ? ? ? ?Portal vein thrombosis ? ? ?#) History of portal vein thrombus: From discussions with ED, the patient has a known history of portal vein thrombus dating back to at least January 2023 when imaging revealed evidence of such.  This evening's TNG for GI bleed once again showed will vein as well as the right portal vein, unchanged from most recent prior imaging,  as further detailed above.  It does not appear that the patient has been on interval blood thinners.  However, in the setting of presenting acute upper gastrointestinal bleed, will refrain from initiation of anticoagulation at this time, pending acute work-up and management for the latter.  ? ?Plan: Further evaluation management of presenting acute upper gastrointestinal bleed, as above.  Repeat CBC in the morning.  Recheck INR. ? ? ? ?Polysubstance abuse ([place]) ? ? ?#) History of polysubstance abuse: Documented history of prior cocaine, heroin, amphetamine abuse, recent  recreational drug use to me.  She also denies any history of alcohol abuse.  ? ?Plan: Check urinary drug screen.  Consult to transition of care team place. ? ? ? ?Essential hypertension ?documented h/o such, with outpati

## 2021-09-02 ENCOUNTER — Other Ambulatory Visit: Payer: Self-pay

## 2021-09-02 ENCOUNTER — Encounter (HOSPITAL_COMMUNITY)
Admission: EM | Disposition: A | Discharge status: Skilled Nursing Facility | Payer: Self-pay | Source: Home / Self Care | Attending: Family Medicine

## 2021-09-02 LAB — CBC
HCT: 29.8 % — ABNORMAL LOW (ref 36.0–46.0)
Hemoglobin: 10.2 g/dL — ABNORMAL LOW (ref 12.0–15.0)
MCH: 25.2 pg — ABNORMAL LOW (ref 26.0–34.0)
MCHC: 34.2 g/dL (ref 30.0–36.0)
MCV: 73.6 fL — ABNORMAL LOW (ref 80.0–100.0)
Platelets: 116 10*3/uL — ABNORMAL LOW (ref 150–400)
RBC: 4.05 MIL/uL (ref 3.87–5.11)
RDW: 24.6 % — ABNORMAL HIGH (ref 11.5–15.5)
WBC: 15.6 10*3/uL — ABNORMAL HIGH (ref 4.0–10.5)
nRBC: 0 % (ref 0.0–0.2)

## 2021-09-02 LAB — GLUCOSE, CAPILLARY: Glucose-Capillary: 58 mg/dL — ABNORMAL LOW (ref 70–99)

## 2021-09-02 LAB — RENAL FUNCTION PANEL
Albumin: 1.7 g/dL — ABNORMAL LOW (ref 3.5–5.0)
Anion gap: 7 (ref 5–15)
BUN: 40 mg/dL — ABNORMAL HIGH (ref 6–20)
CO2: 16 mmol/L — ABNORMAL LOW (ref 22–32)
Calcium: 7.3 mg/dL — ABNORMAL LOW (ref 8.9–10.3)
Chloride: 109 mmol/L (ref 98–111)
Creatinine, Ser: 0.73 mg/dL (ref 0.44–1.00)
GFR, Estimated: >60 mL/min (ref >60)
Glucose, Bld: 100 mg/dL — ABNORMAL HIGH (ref 70–99)
Phosphorus: 3 mg/dL (ref 2.5–4.6)
Potassium: 4.2 mmol/L (ref 3.5–5.1)
Sodium: 132 mmol/L — ABNORMAL LOW (ref 135–145)

## 2021-09-02 LAB — TROPONIN I (HIGH SENSITIVITY)
Troponin I (High Sensitivity): 20 ng/L — ABNORMAL HIGH (ref <18)
Troponin I (High Sensitivity): 22 ng/L — ABNORMAL HIGH (ref <18)

## 2021-09-02 LAB — LIPASE, BLOOD: Lipase: 34 U/L (ref 11–51)

## 2021-09-02 SURGERY — CANCELLED PROCEDURE
Anesthesia: Monitor Anesthesia Care

## 2021-09-02 MED ORDER — HYDRALAZINE HCL 50 MG PO TABS
50.0000 mg | ORAL_TABLET | Freq: Two times a day (BID) | ORAL | Status: DC
  Administered 2021-09-02 – 2021-09-17 (×26): 50 mg via ORAL
  Filled 2021-09-02 (×29): qty 1

## 2021-09-02 MED ORDER — IPRATROPIUM-ALBUTEROL 0.5-2.5 (3) MG/3ML IN SOLN
3.0000 mL | Freq: Four times a day (QID) | RESPIRATORY_TRACT | Status: DC | PRN
  Administered 2021-09-02 (×2): 3 mL via RESPIRATORY_TRACT
  Filled 2021-09-02 (×2): qty 3

## 2021-09-02 MED ORDER — MORPHINE SULFATE (PF) 2 MG/ML IV SOLN
1.0000 mg | INTRAVENOUS | Status: DC | PRN
  Administered 2021-09-02 – 2021-09-03 (×6): 1 mg via INTRAVENOUS
  Filled 2021-09-02 (×7): qty 1

## 2021-09-02 MED ORDER — AMLODIPINE BESYLATE 10 MG PO TABS
10.0000 mg | ORAL_TABLET | Freq: Every day | ORAL | Status: DC
  Administered 2021-09-02 – 2021-09-16 (×14): 10 mg via ORAL
  Filled 2021-09-02 (×15): qty 1

## 2021-09-02 MED ORDER — LACTATED RINGERS IV SOLN
INTRAVENOUS | Status: DC
Start: 2021-09-02 — End: 2021-09-02

## 2021-09-02 MED ORDER — PROPOFOL 1000 MG/100ML IV EMUL
INTRAVENOUS | Status: AC
  Filled 2021-09-02: qty 100

## 2021-09-02 MED ORDER — LIDOCAINE 5 % EX PTCH
1.0000 | MEDICATED_PATCH | Freq: Every day | CUTANEOUS | Status: DC
  Administered 2021-09-02 – 2021-10-09 (×30): 1 via TRANSDERMAL
  Filled 2021-09-02 (×41): qty 1

## 2021-09-02 MED ORDER — LISINOPRIL 10 MG PO TABS
10.0000 mg | ORAL_TABLET | Freq: Every day | ORAL | Status: DC
  Administered 2021-09-02 – 2021-09-17 (×12): 10 mg via ORAL
  Filled 2021-09-02 (×14): qty 1

## 2021-09-02 NOTE — TOC Initial Note (Signed)
Transition of Care (TOC) - Initial/Assessment Note  ? ? ?Patient Details  ?Name: Terri [name] ?MRN: [id number] ?Date of Birth: 1976/05/03 ? ?Transition of Care (TOC) CM/SW Contact:    ?Tawanna Cooler, RN ?Phone Number: ?09/02/2021, 2:26 PM ? ?Clinical Narrative:                 ? ?Homeless patient, was found in her car.  History of cocaine and heroin abuse.  Has tried to get into [facility] in the past.  Added substance abuse resources to AVS.   ?Will potentially need bus pass at discharge.  ? ?Expected Discharge Plan: [place] ?Barriers to Discharge: Continued Medical Work up ? ? ?Expected Discharge Plan and Services ?Expected Discharge Plan: [place] ?  ?  ?Living arrangements for the past 2 months: No permanent address ?                ?  ?Prior Living Arrangements/Services ?Living arrangements for the past 2 months: No permanent address ?Lives with:: Self ?Patient language and need for interpreter reviewed:: Yes ?       ?Need for Family Participation in Patient Care: No (Comment) ?Care giver support system in place?: No (comment) ?  ?Criminal Activity/Legal Involvement Pertinent to Current Situation/Hospitalization: No - Comment as needed ? ?Activities of Daily Living ?Home Assistive Devices/Equipment: Eyeglasses ?ADL Screening (condition at time of admission) ?Patient's cognitive ability adequate to safely complete daily activities?: Yes ?Is the patient deaf or have difficulty hearing?: No ?Does the patient have difficulty seeing, even when wearing glasses/contacts?: No ?Does the patient have difficulty concentrating, remembering, or making decisions?: No ?Patient able to express need for assistance with ADLs?: Yes ?Does the patient have difficulty dressing or bathing?: No ?Independently performs ADLs?: Yes (appropriate for developmental age) ?Does the patient have difficulty walking or climbing stairs?: No ?Weakness of Legs: Both ?Weakness of Arms/Hands: Both ? ?Emotional Assessment ?   ?Orientation: : Oriented to Self, Oriented to Place, Oriented to  Time, Oriented to Situation ?Alcohol / Substance Use: Illicit Drugs, Tobacco Use (Cocaine, heroin, cigarettes) ?Psych Involvement: No (comment) ? ?Admission diagnosis:  Melena [K92.1] ?Acute GI bleeding [K92.2] ?Acute upper GI bleed [K92.2] ?Anemia, unspecified type [D64.9] ?Patient Active Problem List  ? Diagnosis Date Noted  ? Acute upper GI bleed 08/31/2021  ? 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 Lactic acidosis 08/31/2021  ? Hepatic lesion 08/31/2021  ? Community acquired pneumonia 07/08/2021  ? Homelessness 07/08/2021  ? Pneumonia of both lower lobes due to infectious organism   ? Cellulitis of right lower extremity 05/28/2021  ? Vitamin B12 deficiency 05/24/2021  ? Cellulitis 05/22/2021  ? COVID-19 virus infection 05/22/2021  ? Overdose 04/13/2021  ? Hypoxemia 04/13/2021  ? Hypoglycemia 04/13/2021  ? Asthma exacerbation 04/01/2021  ? Acute on chronic blood loss anemia 04/01/2021  ? Iron deficiency anemia 03/18/2021  ? Hyponatremia 03/18/2021  ? Marginal ulcer 03/15/2021  ? Microcytic anemia 02/26/2021  ? Mild protein malnutrition ([place]) 02/26/2021  ? Polysubstance abuse ([place]) 01/16/2021  ? Abdominal pain 01/14/2021  ? 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 Cocaine abuse ([place]) 01/13/2021  ? Heroin abuse ([place]) 01/13/2021  ? Hypertensive urgency 01/13/2021  ? Tobacco abuse 01/13/2021  ? SIRS (systemic inflammatory response syndrome) ([place]) 01/13/2021  ? Acute pyelonephritis 02/11/2019  ? Sepsis due to Escherichia coli (E. coli) ([place]) 02/11/2019  ? AKI (acute kidney injury) ([place])   ? Peptic ulcer disease  02/09/2019  ? 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 Persistent fever 02/09/2019  ? COVID-19 virus not detected   ? Chest pain   ? Bacteremia due to Escherichia coli   ? Esophagitis   ? Hypokalemia   ? Hypomagnesemia   ? Sepsis ([place]) 02/02/2019  ? Leucocytosis 02/02/2019  ?  Thrombocytosis 02/02/2019  ? Acute lower UTI 02/02/2019  ? Apnea 09/13/2017  ? Essential hypertension   ? Cellulitis and abscess of neck 08/12/2017  ? Cellulitis of submandibular region 08/10/2017  ? Odontogenic infection of jaw 08/10/2017  ? Cholecystitis 02/14/2017  ? Opioid abuse with opioid-induced mood disorder ([place]) 01/25/2017  ? Anxiety 02/11/2014  ? Depression 07/12/2012  ? ?PCP:  Pcp, No ?Pharmacy:   ?[id number] [address]?[place] [address]?Phone: [phone]: [phone]? ? ?Readmission Risk Interventions ? ?  09/02/2021  ?  2:25 PM  ?Readmission Risk Prevention Plan  ?Transportation Screening Complete  ?Medication Review (Press photographer Complete  ?PCP or Specialist appointment within 3-5 days of discharge Complete  ?Palliative Care Screening Not Applicable  ?[place]  ? ? ? ?

## 2021-09-02 NOTE — Progress Notes (Signed)
[facility] Progress Note ? ?ENNIFER HARSTON 46 y.o. 06/16/1975 ? ?CC:  Melena, UGI bleed ? ? ?Subjective: ?Patient scheduled for EGD this morning, cancelled due to elevated BP (186/132). Patient back in room, denies abdominal pain, nausea, vomiting, fever, chills. No further episodes of melena. Wants to advance diet. ? ?ROS : Review of Systems  ?Constitutional:  Negative for chills and fever.  ?Gastrointestinal:  Negative for abdominal pain, blood in stool, constipation, diarrhea, heartburn, melena, nausea and vomiting.  ?Genitourinary:  Negative for dysuria and hematuria.   ? ? ?Objective: ?Vital signs in last 24 hours: ?Vitals:  ? 09/02/21 0800 09/02/21 0854  ?BP: (!) 177/123 (!) 152/115  ?Pulse: 84 80  ?Resp: (!) 27 15  ?Temp: 97.7 ?F (36.5 ?C) (!) 97.5 ?F (36.4 ?C)  ?SpO2: 99% 100%  ? ? ?Physical Exam: ? ?General:  Alert, cooperative, no distress, thin and chronically ill-appearing  ?Head:  Normocephalic, without obvious abnormality, atraumatic  ?Eyes:  Anicteric sclera, EOM's intact  ?Lungs:   Clear to auscultation bilaterally, respirations unlabored  ?Heart:  Regular rate and rhythm, S1, S2 normal  ?Abdomen:   Soft, non-tender, bowel sounds active all four quadrants,  no masses,   ?Extremities: Extremities normal, atraumatic, no  edema  ?Pulses: 2+ and symmetric  ? ? ?Lab Results: ?Recent Labs  ?  08/31/21 ?1610 09/01/21 ?0452  ?NA 132* 133*  ?K 4.0 4.0  ?CL 102 107  ?CO2 23 20*  ?GLUCOSE 71 46*  ?BUN 72* 65*  ?CREATININE 0.88 0.60  ?CALCIUM 7.5* 7.4*  ?MG 2.0 2.0  ? ?Recent Labs  ?  08/31/21 ?1610 09/01/21 ?0452  ?AST 26 24  ?ALT 16 19  ?ALKPHOS 115 110  ?BILITOT 0.6 1.6*  ?PROT 4.6* 4.8*  ?ALBUMIN 1.8* 1.8*  ? ?Recent Labs  ?  08/31/21 ?1610 09/01/21 ?0452 09/01/21 ?0913  ?WBC 12.0* 15.8*  --   ?NEUTROABS 10.4* 14.4*  --   ?HGB 4.6* 9.6* 9.8*  ?HCT 13.3* 27.0* 28.0*  ?MCV 63.3* 72.0*  --   ?PLT 122* 96*  --   ? ?Recent Labs  ?  08/31/21 ?1610 09/01/21 ?0452  ?LABPROT 16.2* 15.2  ?INR 1.3* 1.2   ? ? ? ? ?Assessment ?Upper GI bleed, melena ?- Hgb 4.6 on presentation to ED, improved to 9.6 after 3 units PRBCs ?- Hgb 9.8 today 4/12 ?- WBC 15.8, MCV 72.0, Platelets 96 (4/11) ?- BUN 65, Cr 0.60, GFR >60 (4/11) ?- Tbili 1.6, normal liver enzymes ?- Last EGD 12/2020 showed large gastric ulcer ?  ?- CT angio 08/31/21 - Exam severely limited by photopenia; however, no intraluminal contrast extravasation to localize GI bleeding.  Large burden of thrombus within the central portal vein as well as the right portal vein and branches as seen on recent exam dated 08/25/2021.  Multiple low-attenuation lesions throughout the liver, seen on recent prior exam, highly concerning for hepatic abscesses or metastases.  Moderate volume ascites throughout the abdomen and pelvis similar to prior. ? ? ?Plan: ?EGD cancelled this morning due to elevated BP. Will plan for EGD tomorrow.  ?Continue Protonix drip. ?Can have soft dysphagia 3 diet today. ?NPO at midnight. ?Continue daily CBC and transfuse as needed to maintain HGB > 7  ?Continue supportive care as needed. ?[facility] will follow.   ? ?Angelique Holm PA-C ?09/02/2021, 9:34 AM ? ?Contact #  [phone]  ?

## 2021-09-02 NOTE — Discharge Instructions (Addendum)
Outpatient Substance Use Treatment Services   [facility] [address] [place], [place] [address]  [phone] Private insurance, Medicare A&B, and GCCN   [facility]  [address]  [place], [place] [address] [phone] Medicaid, Self Pay   [facility]      [address]  [place], [place] [phone] Medicaid and Private Insurance, Self Pay   [facility] [address]  [place], [place]  [phone] Private Insurance, and Self Pay  [facility]      [address]    [place], [place] [address]  [phone] or [phone] Private Insurance Only   [facility] [address]  [place], [place] [address] [phone] Medicaid, Medicare, Private Insurance  [facility] [address]  [place], [place] [address] [phone] Services are free or reduced  [facility]  [address] [phone]  Self Pay only, sliding scale  [facility]  [address]  [place], [place] [address] [phone] ([facility]) Self Pay, Medicaid Only   [facility] [address]  , [place] [address] [phone] Medicaid, Medicare, Private Insurance  Residential Substance Use Treatment Services   [facility]  [address]  [place], [place] [address]  [phone] or [phone] Detox (Medicare, Medicaid, private insurance, and self pay)  Residential Rehab 14 days (Medicare, Medicaid, private insurance, and self pay)   [facility]  [address] [place], [place]  [phone]  Female and Female Detox (Self Pay and Medicaid limited availability)  Rehab only Female (Medicaid and self pay only)   [facility]      [address]   , [place] [address]  [phone] or [phone] Detox and Residential Treatment Private Insurance Only   [facility]  [address]  [place], [place] [address]  [phone]  Treatment Only, must make assessment appointment, and must be sober for assessment appointment.  Self Pay Only, Medicare A&B, [place] Medicaid, [place] ID only! *Transportation assistance offered from [employer] [address]  [facility]     [address] [place], [place] [address] Walk in interviews M-Sat 8-4p No pending legal charges [phone]  [facility]:  [facility] Referral  [address] [place], [place] [phone] (Self Pay, Medicaid)  [facility] [address] [place], [place] [address] [phone] Detox and Residential Treatment Medicare and Private Insurance  [facility] [address]  [place], [place] [address] 28 Day Women's Facility: [phone] 28 Day Men's Facility: [phone] Long-term Residential Program:  [phone] Males 25 and Over (No Insurance, upfront fee)  [facility]  [address] [place], [place] [address] [phone] Private Insurance with [employer], Private Pay  [facility] [address] [place], [place] [address] Local [phone] Private Insurance Only  [facility] [address]  , [place] [address]  [phone] (Males, upfront fee)  [facility] [place] [address]  [place], [address] [phone] Private Insurance   [facility] Locations  [facility]  [address]  [place],   [phone] Christian Based Program for individuals experiencing homelessness Self Pay, No insurance  [facility]  Men's program: [facility] [address]  [place],  [address] [phone]  [facility]: [place] [facility] [address]   Pay, Medicaid) ? ?[facility] ?[place], [place] [address] ?[phone] ?Detox and Residential Treatment ?Medicare and Private Insurance ? ?[facility] ?[place]  [place], [place] [address] ?Black: [phone] ?46 Day Men's Facility: [phone] ?Long-term Residential Program:  ?[phone] Males 25 and Over ?(No Insurance, upfront fee) ? ? ? ?[employer] Locations ? ?[employer]  ?[place]  ?[place], [place]  ?[phone] ?Christian Based Program for individuals experiencing homelessness ?Self Pay, No insurance ? ?[facility] ?[employer] program: [employer] ?[place] ?[place], [place] [address] ?[phone] ?Christian Based Program for individuals experiencing homelessness ?Self Pay, No insurance ? ? ?[facility] ?[place], [place] [address] ?[phone] ?Christian Based Program for individuals experiencing homelessness ?Self Pay, No insurance ? ? ?High Calorie, High Protein Nutrition Therapy ? ?A high-calorie, high-protein diet has been recommended  to you. Your registered dietitian nutritionist (RDN) may have recommended this diet because you are having difficulty eating enough calories throughout the day, you have lost weight, and/or you need to add protein to your diet. ? ?Sometimes you may not feel like eating, even if you know the importance of good nutrition. The recommendations in this handout can help you with the following: ? ?Regaining your strength and energy ?Keeping your body healthy ?Healing and recovering from surgery or illness and fighting infection ? ?Schedule Your Meals and Snacks ? ?Several small meals and snacks are often better tolerated and digested than large meals. ? ?Strategies ? ?Plan to eat 3 meals and 3 snacks daily. ?Experiment with timing meals to find out when you have a larger appetite. ?Appetite may be greatest in the morning after not eating all night so you may prefer to eat your larger meals and snacks in the morning and at lunch. ?Breakfast-type foods are often better tolerated so eat foods such as eggs, pancakes, waffles and cereal for any meal or snack. ?Carry snacks with you so you are prepared to eat every 2 to 3 hours. ?Determine what works best for you if your body?s cues for feeling hungry or full are not working. ?Eat a small meal or snack even if you don?t feel hungry. ?Set a timer to remind you when it is time to eat. ?Take a walk before you eat (with health care provider?s approval). ?Light or moderate physical activity can help you maintain muscle and increase your appetite. ? ?Make Eating Enjoyable ? ?Taking steps to make the experience enjoyable may help to increase your interest in eating and improve your appetite. ? ?Strategies: ? ?Eat with others whenever possible. ?Include your favorite foods to make meals more enjoyable. ?Try new foods. ?Save your beverage for the end of the meal so that you have more room for food before you get full. ? ?Add Calories to Your Meals and Snacks ? ?Try adding calorie-dense  foods so that each bite provides more nutrition. ? ?Strategies ? ?Drink milk, chocolate milk, soy milk, or smoothies instead of low-calorie beverages such as diet drinks or water. ?Cook with milk or soy milk instead of water when making dishes such as hot cereal, cocoa, or pudding. ?Add jelly, jam, honey, butter or margarine to bread and crackers. Add jam or fruit to ice cream and as a topping over cake. ?Mix dried fruit, nuts, granola, honey, or dry cereal with yogurt or hot cereals. ?Enjoy snacks such as milkshakes, smoothies, pudding, ice cream, or custard. ?Blend a fruit smoothie  of a banana, frozen berries, milk or soy milk, and 1 tablespoon nonfat powdered milk or protein powder. ? ?Add Protein to Your Meals and Snacks ? ?Choose at least one protein food at each meal and snack to increase your daily intake. ? ?Strategies ? ?Add ? cup nonfat dry milk powder or protein powder to make a high-protein milk to drink or to use in recipes that call for milk. Vanilla or peppermint extract or unsweetened cocoa powder could help to boost the flavor. ?Add hard-cooked eggs, leftover meat, grated cheese, canned beans or tofu to noodles, rice, salads, sandwiches, soups, casseroles, pasta, tuna and other mixed dishes. ?Add powdered milk or protein powder to hot cereals, meatloaf, casseroles, scrambled eggs, sauces, cream soups, and shakes. ?Add beans and lentils to salads, soups, casseroles, and vegetable dishes. ?Eat cottage cheese or yogurt, especially [place] yogurt, with fruit as a snack or dessert. ?Eat peanut or other nut butters on crackers, bread, toast, waffles, apples, bananas or celery sticks. Add it to milkshakes, smoothies, or desserts. ?Consider a ready-made protein shake.  ? ?Add Fats to Your Meals and Snacks ? ?Try adding fats to your meals and snacks. Fat provides more calories in fewer bites than carbohydrate or protein and adds flavors to your foods. ? ?Strategies ? ?Snack on nuts and seeds or add them to  foods like salads, pasta, cereals, yogurt, and ice cream.  ?Saut? or stir-fry vegetables, meats, chicken, fish or tofu in olive or canola oil.  ?Add olive oil, other vegetable oils, butter or margarine to soup

## 2021-09-02 NOTE — Progress Notes (Signed)
Patient brought down for her endoscopy procedure. BP 186/132. Anesthesia provider and GI MD notified that patient is having continuous hypertension. Decision to cancel procedure and patient updated.  ?

## 2021-09-02 NOTE — Progress Notes (Signed)
?Progress Note ? ? ?Patient: Terri [name] PIR:[id number] DOB: [birth year] DOA: 08/31/2021     1 ?DOS: the patient was seen and examined on 09/02/2021 ?  ?Brief hospital course: ?46 y.o. female with medical history significant for acute upper GI bleed in the setting of peptic ulcer disease, acute lower GI bleed in 2021, essential hypertension, chronic blood loss anemia associated with baseline hemoglobin range 9-11, polysubstance abuse, portal vein thrombus, who is admitted to [facility] on 08/31/2021 with acute upper GI bleed after presenting from home to [facility] ED complaining of dark-colored stool.  ?  ?Patient presents with complaint 2 episodes of dark-colored stool over the course the last 24 hours, which she states is new for her even relative to her baseline stool coloration while on chronic oral iron supplementation.  Denies any associated hematochezia.  She notes associated sharp, nonradiating epigastric discomfort in the absence of any preceding trauma.  She also conveys associated nausea in the absence of any vomiting, and specifically denies any recent hematemesis.  No recent diarrhea.  Over the course of the last 12 hours, she also reports mild shortness of breath and dizziness in the absence of any vertigo, presyncope, syncope, or fall.  Her shortness of breath is not associate any chest pain, palpitations, diaphoresis, nor any orthopnea, PND, or worsening peripheral edema.  Denies any cough or hemoptysis.  No new calf tenderness or new lower extremity erythema.  She also denies any associated subjective fever, chills, rigors, or generalized myalgias.  No recent dysuria or gross hematuria.  She notes no additional episodes of melena since arrival in the ED today. ?  ?She has a history of gastric bypass surgery, as well as history of acute upper gastrointestinal bleed, with chart review revealing documentation of ED presentation 04/21/2021 for hematemesis, at which time she was discharged from the  emergency department with plan to follow-up with gastroenterology as an outpatient on PPI.  Per chart review, she also has a history of acute lower gastrointestinal bleed in 2021.  She has previously required blood transfusion.  It appears that most recent EGD occurred in August 2022. ?  ?The patient denies any regular or recent consumption of alcohol, and denies any known history of underlying chronic liver disease. Denies any use of blood thinners as an outpatient, including no aspirin. No regular or recent use of NSAIDs. Also denies taking any of the following oral medications at home: systemic corticosteroids, supplemental potassium, bisphosphonate medications.  ?  ?Per chart review, it appears that patient's baseline hemoglobin is 9-11, with most recent prior hemoglobin data points as follows: 9.4 on 08/27/2021, 8.5 on 08/19/2021, 11.0 on 07/08/2021.  ?  ?Per my discussions with the EDP, she has a known history of portal vein thrombus, with imaging dating back to at least January 2023 demonstrating evidence of such.  As noted above, no recent blood thinners as an outpatient. ? ?4/11 ?Improving H&H s/p 3u pRBC transfusion; Has appetite; GI plans to do EGD in AM.  ? ?4/12 ?Blood pressure elevated, anesthesiology did not feel comfortable with sedation for EGD ?Will restart home medications slowly, has diet-tolerating well. Asking for dilaudid for chronic back pain, we discussed holding off in setting of acute physiologic changes and multimodal pain control regimen. Hopeful that EGD can be obtained 4/13 ? ? ?Assessment and Plan: ?* Acute upper GI bleed ?- 2 episodes of melena over the course the last day,  ?- elevated BUN of 72 compared to value of 27 on 08/27/2021 ?-  DRE revealing melena with Hemoccult positive stool.  ?- acute on chronic blood loss anemia, with presenting Hgb noted to be 4.6 compared to most recent prior value of 9.4 on 08/27/2021.  ? ?Continue NPO.  ?Hold DVT ppx ?Continue telemetry. ?GI plan on EGD in  AM. ?Q4H H&H's have been ordered through 9 AM on 09/01/2021. Will closely monitor these ensuing Hgb levels and correlate these data points with the patient's overall clinical picture including vital signs to determine need for subsequent transfusion beyond the existing orders to transfuse 3 units PRBC.  ?On-call GI consulted, as above, with additional recs pending at this time. Further evaluation and management of corresponding acute blood loss anemia, including adding-on iron studies to pre-transfusion specimen, as further detailed below. CMP in the AM. Protonix drip.  Rocephin 1 g IV x1 for SBP prophylaxis, as above.  Holding home oral iron supplementation for now.  Prn IV fentanyl for epigastric pain.   ? ?Acute on chronic blood loss anemia ?#) Acute on chronic blood loss anemia: in the setting of suspected acute upper GI bleed, presenting Hgb noted to be 4.6, which is relative to most recent prior Hgb value of 9.4 on 08/27/2021, and relative to known history of chronic iron deficiency anemia associated with baseline hemoglobin range of 9-11. At this time, patient appears hemodynamically stable and asymptomatic aside from mild epigastric discomfort, as further described above.  Type and screen completed in the ED today, with initial EDP orders for transfusion of 2 units PRBC, which have not yet been started.  I have ordered a 3rd unit prbc to be transfused, with rationale as above.   ? ?- GI plans EGD tomorrow AM, given diet today-tolerating. Optimizing BP control will be paramount for successful EGD tomorrow ? NPO past MN. ?Will liberate CBC, q12 CBC; transfusion goal >7  ?Monitor on telemetry. Monitor continuous pulse-ox.  ?Transfer to card tele do not feel she needs step down based on current evaluation and progression ? ?  ? ?Hepatic lesion ? ? ?#) Multiple low-attenuation hepatic lesions: Known history of such; this evening CT angio for GI bleed demonstrates multiple low-attenuation hepatic lesions unclear  etiology, unchanged from most recent prior imaging, as further detailed above.  ? ?Plan: Gastroenterology consulted, as above. ? ? ? ? ? ? ?Lactic acidosis ? ? ?#) Lactic acidosis: Initial lactate noted to be 2.7.  Suspect that this is on the basis of relative generalized tissue hypoperfusion as a consequence of decline in oxygen delivery capacity in the setting of presenting acute on chronic blood loss anemia, as further quantified above.  While SIRS criteria are met with mild leukocytosis as well as mild tachypnea, it appears that these factors are present on the basis of inflammatory/hemoconcentrated effects as well as ensuing implications of presenting acute blood loss anemia in the setting of acute upper gastrointestinal bleed.  No evidence of underlying infectious process at this time.  Therefore, criteria for sepsis not currently met.  Of note, chest ray shows evidence of acute cardiopulmonary process.  Urinalysis result currently pending.  The patient is currently receiving normal saline at 125 cc/h leading up to initiation of PRBC transfusion, and will repeat lactate as indicated below. ? ?Plan: Further evaluation management of acute on chronic blood loss anemia in the setting of acute upper gastrointestinal bleed, including plan for PRBC transfusion, as further detailed above.  Continuous and assess, as above.  Repeat lactate at midnight on 09/01/21.  Repeat INR in the morning.  Check urinalysis.  Add on serum ethanol level.  Repeat CMP in the morning. ? ? ? ? ?Portal vein thrombosis ? ? ?#) History of portal vein thrombus: From discussions with ED, the patient has a known history of portal vein thrombus dating back to at least January 2023 when imaging revealed evidence of such.  This evening's TNG for GI bleed once again showed will vein as well as the right portal vein, unchanged from most recent prior imaging, as further detailed above.  It does not appear that the patient has been on interval blood  thinners.  However, in the setting of presenting acute upper gastrointestinal bleed, will refrain from initiation of anticoagulation at this time, pending acute work-up and management for the latter.  ? ?Plan: Fu

## 2021-09-02 NOTE — Plan of Care (Signed)
?  Problem: Fluid Volume: ?Goal: Will show no signs and symptoms of excessive bleeding ?Outcome: Progressing ?  ?Problem: Clinical Measurements: ?Goal: Complications related to the disease process, condition or treatment will be avoided or minimized ?Outcome: Progressing ?  ?Problem: Education: ?Goal: Knowledge of General Education information will improve ?Description: Including pain rating scale, medication(s)/side effects and non-pharmacologic comfort measures ?Outcome: Progressing ?  ?Problem: Clinical Measurements: ?Goal: Will remain free from infection ?Outcome: Progressing ?Goal: Respiratory complications will improve ?Outcome: Progressing ?  ?

## 2021-09-03 ENCOUNTER — Encounter (HOSPITAL_COMMUNITY): Payer: Self-pay | Admitting: Internal Medicine

## 2021-09-03 ENCOUNTER — Inpatient Hospital Stay (HOSPITAL_COMMUNITY): Payer: Medicaid Other | Admitting: Anesthesiology

## 2021-09-03 ENCOUNTER — Encounter (HOSPITAL_COMMUNITY)
Admission: EM | Disposition: A | Discharge status: Skilled Nursing Facility | Payer: Self-pay | Source: Home / Self Care | Attending: Family Medicine

## 2021-09-03 DIAGNOSIS — B3781 Candidal esophagitis: Secondary | ICD-10-CM | POA: Diagnosis present

## 2021-09-03 DIAGNOSIS — K284 Chronic or unspecified gastrojejunal ulcer with hemorrhage: Secondary | ICD-10-CM

## 2021-09-03 DIAGNOSIS — I10 Essential (primary) hypertension: Secondary | ICD-10-CM

## 2021-09-03 DIAGNOSIS — F17210 Nicotine dependence, cigarettes, uncomplicated: Secondary | ICD-10-CM

## 2021-09-03 DIAGNOSIS — K229 Disease of esophagus, unspecified: Secondary | ICD-10-CM

## 2021-09-03 DIAGNOSIS — D62 Acute posthemorrhagic anemia: Secondary | ICD-10-CM

## 2021-09-03 HISTORY — PX: ESOPHAGOGASTRODUODENOSCOPY: SHX5428

## 2021-09-03 LAB — TYPE AND SCREEN
ABO/RH(D): B POS
Antibody Screen: NEGATIVE
Unit division: 0
Unit division: 0
Unit division: 0

## 2021-09-03 LAB — BPAM RBC
Blood Product Expiration Date: 202304292359
Blood Product Expiration Date: 202304292359
Blood Product Expiration Date: 202304292359
ISSUE DATE / TIME: 202304101957
ISSUE DATE / TIME: 202304102224
ISSUE DATE / TIME: 202304110057
Unit Type and Rh: 7300
Unit Type and Rh: 7300
Unit Type and Rh: 7300

## 2021-09-03 LAB — HEPATITIS PANEL, ACUTE
HCV Ab: NONREACTIVE
Hep A IgM: NONREACTIVE
Hep B C IgM: NONREACTIVE
Hepatitis B Surface Ag: NONREACTIVE

## 2021-09-03 LAB — HIV ANTIBODY (ROUTINE TESTING W REFLEX): HIV Screen 4th Generation wRfx: NONREACTIVE

## 2021-09-03 LAB — METHYLMALONIC ACID, SERUM: Methylmalonic Acid, Quantitative: 397 nmol/L — ABNORMAL HIGH (ref 0–378)

## 2021-09-03 SURGERY — EGD (ESOPHAGOGASTRODUODENOSCOPY)
Anesthesia: Monitor Anesthesia Care

## 2021-09-03 MED ORDER — FLUCONAZOLE 100 MG PO TABS
100.0000 mg | ORAL_TABLET | Freq: Every day | ORAL | Status: DC
  Administered 2021-09-04 – 2021-09-08 (×4): 100 mg via ORAL
  Filled 2021-09-03 (×4): qty 1

## 2021-09-03 MED ORDER — PROPOFOL 500 MG/50ML IV EMUL
INTRAVENOUS | Status: DC | PRN
  Administered 2021-09-03: 150 ug/kg/min via INTRAVENOUS
  Administered 2021-09-03: 130 ug/kg/min via INTRAVENOUS

## 2021-09-03 MED ORDER — NICOTINE 21 MG/24HR TD PT24
21.0000 mg | MEDICATED_PATCH | Freq: Every day | TRANSDERMAL | Status: DC
  Administered 2021-09-03 – 2021-10-12 (×39): 21 mg via TRANSDERMAL
  Filled 2021-09-03 (×39): qty 1

## 2021-09-03 MED ORDER — SUCRALFATE 1 GM/10ML PO SUSP
1.0000 g | Freq: Three times a day (TID) | ORAL | Status: DC
  Administered 2021-09-03 – 2021-09-19 (×51): 1 g via ORAL
  Filled 2021-09-03 (×55): qty 10

## 2021-09-03 MED ORDER — PROPOFOL 1000 MG/100ML IV EMUL
INTRAVENOUS | Status: AC
  Filled 2021-09-03: qty 100

## 2021-09-03 MED ORDER — LIDOCAINE HCL 1 % IJ SOLN
INTRAMUSCULAR | Status: DC | PRN
  Administered 2021-09-03: 60 mg via INTRADERMAL

## 2021-09-03 MED ORDER — EPHEDRINE SULFATE (PRESSORS) 50 MG/ML IJ SOLN
INTRAMUSCULAR | Status: DC | PRN
  Administered 2021-09-03: 5 mg via INTRAVENOUS

## 2021-09-03 MED ORDER — PROPOFOL 10 MG/ML IV BOLUS
INTRAVENOUS | Status: DC | PRN
  Administered 2021-09-03: 10 mg via INTRAVENOUS

## 2021-09-03 MED ORDER — LACTATED RINGERS IV SOLN: INTRAVENOUS | Status: DC | PRN

## 2021-09-03 MED ORDER — MELATONIN 5 MG PO TABS
5.0000 mg | ORAL_TABLET | Freq: Once | ORAL | Status: AC
  Administered 2021-09-03: 5 mg via ORAL
  Filled 2021-09-03: qty 1

## 2021-09-03 MED ORDER — FLUCONAZOLE IN SODIUM CHLORIDE 200-0.9 MG/100ML-% IV SOLN
200.0000 mg | Freq: Once | INTRAVENOUS | Status: AC
  Administered 2021-09-03: 200 mg via INTRAVENOUS
  Filled 2021-09-03: qty 100

## 2021-09-03 NOTE — Assessment & Plan Note (Addendum)
--   continue fluconazole ?-- check HIV nonreactive ?-- negative acute hepatitis panel  ?

## 2021-09-03 NOTE — Progress Notes (Signed)
Pt called out reporting difficulty breathing. Pt placed on 2L [place] for comfort only. Provider notified and fluids were stopped per provider request. Respiratory notified and PRN nebs administered with good results. Pt reported breathing better after cessation of fluids and administration of nebulizers. Pt resting comfortably - plan of care continues.  ?

## 2021-09-03 NOTE — Transfer of Care (Signed)
Immediate Anesthesia Transfer of Care Note ? ?Patient: Terri [name] ? ?Procedure(s) Performed: ESOPHAGOGASTRODUODENOSCOPY (EGD) ? ?Patient Location: PACU and Endoscopy Unit ? ?Anesthesia Type:MAC ? ?Level of Consciousness: drowsy ? ?Airway & Oxygen Therapy: Patient Spontanous Breathing and Patient connected to face mask oxygen ? ?Post-op Assessment: Report given to RN and Post -op Vital signs reviewed and stable ? ?Post vital signs: Reviewed and stable ? ?Last Vitals:  ?Vitals Value Taken Time  ?BP 91/50 09/03/21 1311  ?Temp 36.4 ?C 09/03/21 1310  ?Pulse 81 09/03/21 1314  ?Resp 23 09/03/21 1314  ?SpO2 96 % 09/03/21 1314  ?Vitals shown include unvalidated device data. ? ?Last Pain:  ?Vitals:  ? 09/03/21 1310  ?TempSrc: Temporal  ?PainSc: Asleep  ?   ? ?Patients Stated Pain Goal: 0 (09/02/21 9485) ? ?Complications: No notable events documented. ?

## 2021-09-03 NOTE — Op Note (Signed)
[facility] ?Patient Name: Terri [name] ?Procedure Date: 09/03/2021 ?MRN: [id number] ?Attending MD: Arta Silence , MD ?Date of Birth: May 10, 1976 ?CSN: [id number] ?Age: 46 ?Admit Type: Inpatient ?Procedure:                Upper GI endoscopy ?Indications:              Acute post hemorrhagic anemia, Melena ?Providers:                Arta Silence, MD, Dulcy Fanny, Autumn  ?                          [place], Designer, industrial/product, Merchant navy officer,  ?                          Gloris Ham, Technician, Jakyri Brunkhorst Dalton,  ?                          Technician ?Referring MD:             [facility]. ?Medicines:                Monitored Anesthesia Care ?Complications:            No immediate complications. ?Estimated Blood Loss:     Estimated blood loss: none. ?Procedure:                Pre-Anesthesia Assessment: ?                          - Prior to the procedure, a History and Physical  ?                          was performed, and patient medications and  ?                          allergies were reviewed. The patient's tolerance of  ?                          previous anesthesia was also reviewed. The risks  ?                          and benefits of the procedure and the sedation  ?                          options and risks were discussed with the patient.  ?                          All questions were answered, and informed consent  ?                          was obtained. Prior Anticoagulants: The patient has  ?                          taken no previous anticoagulant or antiplatelet  ?                          agents. ASA Grade Assessment: IV -  A patient with  ?                          severe systemic disease that is a constant threat  ?                          to life. After reviewing the risks and benefits,  ?                          the patient was deemed in satisfactory condition to  ?                          undergo the procedure. ?                          After obtaining  informed consent, the endoscope was  ?                          passed under direct vision. Throughout the  ?                          procedure, the patient's blood pressure, pulse, and  ?                          oxygen saturations were monitored continuously. The  ?                          [id number]-H190 ([id number]) Olympus endoscope was introduced  ?                          through the mouth, and advanced to the afferent and  ?                          efferent jejunal loops. The upper GI endoscopy was  ?                          accomplished without difficulty. The patient  ?                          tolerated the procedure well. ?Scope In: ?Scope Out: ?Findings: ?     Diffuse, white plaques were found in the entire esophagus. ?     The exam of the esophagus was otherwise normal. ?     Gastric pouch was inflamed. ?     One large non-bleeding cratered ulcer with no stigmata of bleeding was  ?     found at the anastomosis. The lesion was thirty mm by thirty mm in  ?     largest dimension. ?Impression:               - Esophageal plaques were found, consistent with  ?                          candidiasis. ?                          -  Large cratered non-bleeding jejunal ulcer with no  ?                          stigmata of bleeding. Nodular regions look more  ?                          like regenerative tissue and not like visible  ?                          vessels. ?                          - No specimens collected. ?Moderate Sedation: ?     Not Applicable - Patient had care per Anesthesia. ?Recommendation:           - Return patient to hospital ward for ongoing care. ?                          - Continue present medications including PPI. Add  ?                          sucralfate. Add fluconazole. ?                          - [facility] will follow. ?Procedure Code(s):        --- Professional --- ?                          [phone], Esophagogastroduodenoscopy, flexible,  ?                          transoral; diagnostic,  including collection of  ?                          specimen(s) by brushing or washing, when performed  ?                          (separate procedure) ?Diagnosis Code(s):        --- Professional --- ?                          K22.9, Disease of esophagus, unspecified ?                          K28.9, Gastrojejunal ulcer, unspecified as acute or  ?                          chronic, without hemorrhage or perforation ?                          D62, Acute posthemorrhagic anemia ?                          K92.1, Melena (includes Hematochezia) ?CPT copyright 2019 American Medical Association. All rights reserved. ?The codes documented in this report are preliminary and upon coder review may  ?be revised to meet current compliance requirements. ?Arta Silence, MD ?  09/03/2021 1:33:21 PM ?This report has been signed electronically. ?Number of Addenda: 0 ?

## 2021-09-03 NOTE — Progress Notes (Signed)
Alerted by nursing staff that patient was in bathroom and room smelling of cigarette smoke. Staff addressed with patient and she states she was not smoking. This writer rounded on patient and asked if she had been smoking in the room and she denied.  Educated patient on the no smoking policy and danger of smoking around oxygen. Pt verbalized understanding.  This Probation officer requested to search patient belongings and she agreed.  Found pack of cigarettes and lighter in purse.  Requested to remove lighter and place in her chart until discharge.  Pt denied request.  Writer notified patient that if future smoking concern was reported, that security would be notified for potential removal of lighter.  Pt verbalized understanding. ?

## 2021-09-03 NOTE — Anesthesia Postprocedure Evaluation (Signed)
Anesthesia Post Note ? ?Patient: Terri [name] ? ?Procedure(s) Performed: ESOPHAGOGASTRODUODENOSCOPY (EGD) ? ?  ? ?Patient location during evaluation: PACU ?Anesthesia Type: MAC ?Level of consciousness: awake and alert ?Pain management: pain level controlled ?Vital Signs Assessment: post-procedure vital signs reviewed and stable ?Respiratory status: spontaneous breathing, nonlabored ventilation, respiratory function stable and patient connected to nasal cannula oxygen ?Cardiovascular status: stable and blood pressure returned to baseline ?Postop Assessment: no apparent nausea or vomiting ?Anesthetic complications: no ? ? ?No notable events documented. ? ?Last Vitals:  ?Vitals:  ? 09/03/21 1417 09/03/21 1433  ?BP: 107/66 102/76  ?Pulse: 90 96  ?Resp: 18 17  ?Temp: 36.8 ?C   ?SpO2: 94% 99%  ?  ?Last Pain:  ?Vitals:  ? 09/03/21 1417  ?TempSrc: Oral  ?PainSc:   ? ? ?  ?  ?  ?  ?  ?  ? ?[name] ? ? ? ? ?

## 2021-09-03 NOTE — TOC Initial Note (Signed)
Transition of Care (TOC) - Initial/Assessment Note  ? ? ?Patient Details  ?Name: Terri [name] ?MRN: [id number] ?Date of Birth: 1975-09-01 ? ?Transition of Care (TOC) CM/SW Contact:    ?Trish Mage, LCSW ?Phone Number: ?09/03/2021, 10:32 AM ? ?Clinical Narrative:   Patient seen in follow up to [facility] hand off note stating patient has been trying to get into [facility] for SA rehab.  Ms [place] confirms that she has tried to get into [facility],  and would be interested in trying again, but is quick to say that she knows she is not currently medically stable and as such, they will not accept her.  She confimred that her MCD is linked to [facility]. I let her know that the information for [facility] is in her AVS and she can contact them post d/c to find out about possible admission.  She has a car, but it is not running.  She was relieved to tell me that she can stay with her sister here in [place] post d/c, but will need a ride.  She does not know the address of the top of her head.  I asked that she get it from her sister when they talk again so we know where to send her. Patient says she does not have a PCP, and I pointed out that she id linked with a provider through her MCD, and she should call to find out who that is. TOC will continue to follow during the course of hospitalization. ?           ? ? ?Expected Discharge Plan: Home/Self Care ?Barriers to Discharge: Continued Medical Work up ? ? ?Patient Goals and CMS Choice ?Patient states their goals for this hospitalization and ongoing recovery are:: Feel better ?  ?  ? ?Expected Discharge Plan and Services ?Expected Discharge Plan: Home/Self Care ?  ?Discharge Planning Services: CM Consult ?  ?Living arrangements for the past 2 months: No permanent address ?                ?  ?  ?  ?  ?  ?  ?  ?  ?  ?  ? ?Prior Living Arrangements/Services ?Living arrangements for the past 2 months: No permanent address ?Lives with:: Self ?Patient language and need for interpreter  reviewed:: Yes ?       ?Need for Family Participation in Patient Care: No (Comment) ?Care giver support system in place?: No (comment) ?  ?Criminal Activity/Legal Involvement Pertinent to Current Situation/Hospitalization: No - Comment as needed ? ?Activities of Daily Living ?Home Assistive Devices/Equipment: Eyeglasses ?ADL Screening (condition at time of admission) ?Patient's cognitive ability adequate to safely complete daily activities?: Yes ?Is the patient deaf or have difficulty hearing?: No ?Does the patient have difficulty seeing, even when wearing glasses/contacts?: No ?Does the patient have difficulty concentrating, remembering, or making decisions?: No ?Patient able to express need for assistance with ADLs?: Yes ?Does the patient have difficulty dressing or bathing?: No ?Independently performs ADLs?: Yes (appropriate for developmental age) ?Does the patient have difficulty walking or climbing stairs?: No ?Weakness of Legs: Both ?Weakness of Arms/Hands: Both ? ?Permission Sought/Granted ?  ?  ?   ?   ?   ?   ? ?Emotional Assessment ?Appearance:: Appears stated age ?Attitude/Demeanor/Rapport: Engaged ?Affect (typically observed): Appropriate ?Orientation: : Oriented to Self, Oriented to Place, Oriented to  Time, Oriented to Situation ?Alcohol / Substance Use: Illicit Drugs, Tobacco Use (Cocaine, heroin, cigarettes) ?Psych Involvement: No (  comment) ? ?Admission diagnosis:  Melena [K92.1] ?Acute GI bleeding [K92.2] ?Acute upper GI bleed [K92.2] ?Anemia, unspecified type [D64.9] ?Patient Active Problem List  ? Diagnosis Date Noted  ? Acute upper GI bleed 08/31/2021  ? 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 Lactic acidosis 08/31/2021  ? Hepatic lesion 08/31/2021  ? Community acquired pneumonia 07/08/2021  ? Homelessness 07/08/2021  ? Pneumonia of both lower lobes due to infectious organism   ? Cellulitis of right lower extremity 05/28/2021  ? Vitamin B12 deficiency 05/24/2021  ? Cellulitis 05/22/2021  ? COVID-19  virus infection 05/22/2021  ? Overdose 04/13/2021  ? Hypoxemia 04/13/2021  ? Hypoglycemia 04/13/2021  ? Asthma exacerbation 04/01/2021  ? Acute on chronic blood loss anemia 04/01/2021  ? Iron deficiency anemia 03/18/2021  ? Hyponatremia 03/18/2021  ? Marginal ulcer 03/15/2021  ? Microcytic anemia 02/26/2021  ? Mild protein malnutrition ([place]) 02/26/2021  ? Polysubstance abuse ([place]) 01/16/2021  ? Abdominal pain 01/14/2021  ? 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 Cocaine abuse ([place]) 01/13/2021  ? Heroin abuse ([place]) 01/13/2021  ? Hypertensive urgency 01/13/2021  ? Tobacco abuse 01/13/2021  ? SIRS (systemic inflammatory response syndrome) ([place]) 01/13/2021  ? Acute pyelonephritis 02/11/2019  ? Sepsis due to Escherichia coli (E. coli) ([place]) 02/11/2019  ? AKI (acute kidney injury) ([place])   ? Peptic ulcer disease 02/09/2019  ? 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 Persistent fever 02/09/2019  ? COVID-19 virus not detected   ? Chest pain   ? Bacteremia due to Escherichia coli   ? Esophagitis   ? Hypokalemia   ? Hypomagnesemia   ? Sepsis ([place]) 02/02/2019  ? Leucocytosis 02/02/2019  ? Thrombocytosis 02/02/2019  ? Acute lower UTI 02/02/2019  ? Apnea 09/13/2017  ? Essential hypertension   ? Cellulitis and abscess of neck 08/12/2017  ? Cellulitis of submandibular region 08/10/2017  ? Odontogenic infection of jaw 08/10/2017  ? Cholecystitis 02/14/2017  ? Opioid abuse with opioid-induced mood disorder ([facility]) 01/25/2017  ? Anxiety 02/11/2014  ? Depression 07/12/2012  ? ?PCP:  Pcp, No ?Pharmacy:   ?[id number] [place] [facility] [address] [address]?Phone: [phone]: [phone]? ? ? ? ?Social Determinants of Health (SDOH) Interventions ?  ? ?Readmission Risk Interventions ? ?  09/02/2021  ?  2:25 PM  ?Readmission Risk Prevention Plan  ?Transportation Screening  Complete  ?Medication Review (Press photographer Complete  ?PCP or Specialist appointment within 3-5 days of discharge Complete  ?Palliative Care Screening Not Applicable  ?[place] Applic[place]  ? ? ? ?

## 2021-09-03 NOTE — Plan of Care (Signed)
Pt aox4, impulsive at times.  ?For EGD this afternoon with GI service.  ? ?Problem: Education: ?Goal: Ability to identify signs and symptoms of gastrointestinal bleeding will improve ?Outcome: Progressing ?  ?Problem: Bowel/Gastric: ?Goal: Will show no signs and symptoms of gastrointestinal bleeding ?Outcome: Progressing ?  ?Problem: Fluid Volume: ?Goal: Will show no signs and symptoms of excessive bleeding ?Outcome: Progressing ?  ?Problem: Clinical Measurements: ?Goal: Complications related to the disease process, condition or treatment will be avoided or minimized ?Outcome: Progressing ?  ?Problem: Education: ?Goal: Knowledge of General Education information will improve ?Description: Including pain rating scale, medication(s)/side effects and non-pharmacologic comfort measures ?Outcome: Progressing ?  ?Problem: Health Behavior/Discharge Planning: ?Goal: Ability to manage health-related needs will improve ?Outcome: Progressing ?  ?Problem: Clinical Measurements: ?Goal: Ability to maintain clinical measurements within normal limits will improve ?Outcome: Progressing ?Goal: Will remain free from infection ?Outcome: Progressing ?Goal: Diagnostic test results will improve ?Outcome: Progressing ?Goal: Respiratory complications will improve ?Outcome: Progressing ?Goal: Cardiovascular complication will be avoided ?Outcome: Progressing ?  ?Problem: Activity: ?Goal: Risk for activity intolerance will decrease ?Outcome: Progressing ?  ?Problem: Nutrition: ?Goal: Adequate nutrition will be maintained ?Outcome: Progressing ?  ?Problem: Coping: ?Goal: Level of anxiety will decrease ?Outcome: Progressing ?  ?Problem: Elimination: ?Goal: Will not experience complications related to bowel motility ?Outcome: Progressing ?Goal: Will not experience complications related to urinary retention ?Outcome: Progressing ?  ?Problem: Pain Managment: ?Goal: General experience of comfort will improve ?Outcome: Progressing ?  ?Problem:  Safety: ?Goal: Ability to remain free from injury will improve ?Outcome: Progressing ?  ?Problem: Skin Integrity: ?Goal: Risk for impaired skin integrity will decrease ?Outcome: Progressing ?  ?

## 2021-09-03 NOTE — Progress Notes (Signed)
?Progress Note ? ? ?Patient: Terri [name] VOH:[id number] DOB: 1976/04/21 DOA: 08/31/2021     2 ?DOS: the patient was seen and examined on 09/03/2021 ?  ?Brief hospital course: ?46 y.o. female with medical history significant for acute upper GI bleed in the setting of peptic ulcer disease, acute lower GI bleed in 2021, essential hypertension, chronic blood loss anemia associated with baseline hemoglobin range 9-11, polysubstance abuse, portal vein thrombus, who is admitted to [facility] on 08/31/2021 with acute upper GI bleed after presenting from home to [facility] ED complaining of dark-colored stool.  ?  ?Patient presents with complaint 2 episodes of dark-colored stool over the course the last 24 hours, which she states is new for her even relative to her baseline stool coloration while on chronic oral iron supplementation.  Denies any associated hematochezia.  She notes associated sharp, nonradiating epigastric discomfort in the absence of any preceding trauma.  She also conveys associated nausea in the absence of any vomiting, and specifically denies any recent hematemesis.  No recent diarrhea.  Over the course of the last 12 hours, she also reports mild shortness of breath and dizziness in the absence of any vertigo, presyncope, syncope, or fall.  Her shortness of breath is not associate any chest pain, palpitations, diaphoresis, nor any orthopnea, PND, or worsening peripheral edema.  Denies any cough or hemoptysis.  No new calf tenderness or new lower extremity erythema.  She also denies any associated subjective fever, chills, rigors, or generalized myalgias.  No recent dysuria or gross hematuria.  She notes no additional episodes of melena since arrival in the ED today. ?  ?She has a history of gastric bypass surgery, as well as history of acute upper gastrointestinal bleed, with chart review revealing documentation of ED presentation 04/21/2021 for hematemesis, at which time she was discharged from the  emergency department with plan to follow-up with gastroenterology as an outpatient on PPI.  Per chart review, she also has a history of acute lower gastrointestinal bleed in 2021.  She has previously required blood transfusion.  It appears that most recent EGD occurred in August 2022. ?  ?The patient denies any regular or recent consumption of alcohol, and denies any known history of underlying chronic liver disease. Denies any use of blood thinners as an outpatient, including no aspirin. No regular or recent use of NSAIDs. Also denies taking any of the following oral medications at home: systemic corticosteroids, supplemental potassium, bisphosphonate medications.  ?  ?Per chart review, it appears that patient's baseline hemoglobin is 9-11, with most recent prior hemoglobin data points as follows: 9.4 on 08/27/2021, 8.5 on 08/19/2021, 11.0 on 07/08/2021.  ?  ?Per my discussions with the EDP, she has a known history of portal vein thrombus, with imaging dating back to at least January 2023 demonstrating evidence of such.  As noted above, no recent blood thinners as an outpatient. ? ?4/11 ?Improving H&H s/p 3u pRBC transfusion; Has appetite; GI plans to do EGD in AM.  ? ?4/12 ?Blood pressure elevated, anesthesiology did not feel comfortable with sedation for EGD ?Will restart home medications slowly, has diet-tolerating well. Asking for dilaudid for chronic back pain, we discussed holding off in setting of acute physiologic changes and multimodal pain control regimen. Hopeful that EGD can be obtained 4/13 ? ?4/13 - jejunal ulcer, severe candidiasis seen, started on fluconazole.  ?Will repeat HIV/hepatitis panel. ?Continue supportive care ? ? ?Assessment and Plan: ?* Acute upper GI bleed ?- 2 episodes of melena  over the course the last day,  ?- elevated BUN of 72 compared to value of 27 on 08/27/2021 ?- DRE revealing melena with Hemoccult positive stool.  ?- acute on chronic blood loss anemia, with presenting Hgb noted to  be 4.6 compared to most recent prior value of 9.4 on 08/27/2021.  ? ?Continue NPO.  ?Hold DVT ppx ?Continue telemetry. ?GI plan on EGD in AM. ?Q4H H&H's have been ordered through 9 AM on 09/01/2021. Will closely monitor these ensuing Hgb levels and correlate these data points with the patient's overall clinical picture including vital signs to determine need for subsequent transfusion beyond the existing orders to transfuse 3 units PRBC.  ?On-call GI consulted, as above, with additional recs pending at this time. Further evaluation and management of corresponding acute blood loss anemia, including adding-on iron studies to pre-transfusion specimen, as further detailed below. CMP in the AM. Protonix drip.  Rocephin 1 g IV x1 for SBP prophylaxis, as above.  Holding home oral iron supplementation for now.  Prn IV fentanyl for epigastric pain.   ? ?Acute on chronic blood loss anemia ?#) Acute on chronic blood loss anemia: in the setting of suspected acute upper GI bleed, presenting Hgb noted to be 4.6, which is relative to most recent prior Hgb value of 9.4 on 08/27/2021, and relative to known history of chronic iron deficiency anemia associated with baseline hemoglobin range of 9-11. At this time, patient appears hemodynamically stable and asymptomatic aside from mild epigastric discomfort, as further described above.  Type and screen completed in the ED today, with initial EDP orders for transfusion of 2 units PRBC, which have not yet been started.  I have ordered a 3rd unit prbc to be transfused, with rationale as above.   ?S/p EGD on 4/13. Esophageal plaques were found, consistent with candidiasis; ?Jejunal ulcer non active bleeding per report, ?Will need carafate. Appreciate guidance from Gi team ?Cards tele   ? ?Candida infection, esophageal ([place]) ?"Esophageal plaques were found, consistent with candidiasis" ? ?-- continue fluconazole ?-- check HIV, Hepatitis,ordered, previously negative.  ? ?Hepatic lesion ? ? ?#)  Multiple low-attenuation hepatic lesions: Known history of such; this evening CT angio for GI bleed demonstrates multiple low-attenuation hepatic lesions unclear etiology, unchanged from most recent prior imaging, as further detailed above.  ? ?Plan: Gastroenterology consulted, as above. ? ? ? ? ? ? ?Lactic acidosis ? ? ?#) Lactic acidosis: Initial lactate noted to be 2.7.  Suspect that this is on the basis of relative generalized tissue hypoperfusion as a consequence of decline in oxygen delivery capacity in the setting of presenting acute on chronic blood loss anemia, as further quantified above.  While SIRS criteria are met with mild leukocytosis as well as mild tachypnea, it appears that these factors are present on the basis of inflammatory/hemoconcentrated effects as well as ensuing implications of presenting acute blood loss anemia in the setting of acute upper gastrointestinal bleed.  No evidence of underlying infectious process at this time.  Therefore, criteria for sepsis not currently met.  Of note, chest ray shows evidence of acute cardiopulmonary process.  Urinalysis result currently pending.  The patient is currently receiving normal saline at 125 cc/h leading up to initiation of PRBC transfusion, and will repeat lactate as indicated below. ? ?Plan: Further evaluation management of acute on chronic blood loss anemia in the setting of acute upper gastrointestinal bleed, including plan for PRBC transfusion, as further detailed above.  Continuous and assess, as above.  Repeat lactate  at midnight on 09/01/21.  Repeat INR in the morning.  Check urinalysis.  Add on serum ethanol level.  Repeat CMP in the morning. ? ? ? ? ?Portal vein thrombosis ? ? ?#) History of portal vein thrombus: From discussions with ED, the patient has a known history of portal vein thrombus dating back to at least January 2023 when imaging revealed evidence of such.  This evening's TNG for GI bleed once again showed will vein as well  as the right portal vein, unchanged from most recent prior imaging, as further detailed above.  It does not appear that the patient has been on interval blood thinners.  However, in the setting of presenting a

## 2021-09-03 NOTE — Anesthesia Preprocedure Evaluation (Addendum)
Anesthesia Evaluation  ?Patient identified by MRN, date of birth, ID band ?Patient awake ? ? ? ?Reviewed: ?Allergy & Precautions, NPO status , Patient's Chart, lab work & pertinent test results ? ?History of Anesthesia Complications ?Negative for: history of anesthetic complications ? ?Airway ?Mallampati: II ? ?TM Distance: >3 FB ?Neck ROM: Full ? ? ? Dental ?no notable dental hx. ?(+) Edentulous Upper, Edentulous Lower ?  ?Pulmonary ?neg pulmonary ROS, Current Smoker and Patient abstained from smoking.,  ?  ?Pulmonary exam normal ?breath sounds clear to auscultation ? ? ? ? ? ? Cardiovascular ?hypertension, Pt. on medications ?+ DVT  ?negative cardio ROS ?Normal cardiovascular exam ?Rhythm:Regular Rate:Normal ? ?ECG: SR, rate 80 ?  ?Neuro/Psych ? Headaches, PSYCHIATRIC DISORDERS Anxiety Depression Bipolar Disorder negative neurological ROS ? negative psych ROS  ? GI/Hepatic ?negative GI ROS, Neg liver ROS, PUD, (+)  ?  ? substance abuse ? ,   ?Endo/Other  ?negative endocrine ROS ? Renal/GU ?negative Renal ROS  ?negative genitourinary ?  ?Musculoskeletal ?negative musculoskeletal ROS ?(+)  ? Abdominal ?  ?Peds ?negative pediatric ROS ?(+)  Hematology ?negative hematology ROS ?(+) Blood dyscrasia, anemia ,   ?Anesthesia Other Findings ?Anastomotic ulcer ? Reproductive/Obstetrics ?negative OB ROS ? ?  ? ? ? ? ? ? ? ? ? ? ? ? ? ?  ?  ? ? ? ? ? ? ? ?Anesthesia Physical ? ?Anesthesia Plan ? ?ASA: 4 ? ?Anesthesia Plan: MAC  ? ?Post-op Pain Management: Minimal or no pain anticipated  ? ?Induction:  ? ?PONV Risk Score and Plan: 1 and Propofol infusion and Treatment may vary due to age or medical condition ? ?Airway Management Planned: Natural Airway ? ?Additional Equipment: None ? ?Intra-op Plan:  ? ?Post-operative Plan:  ? ?Informed Consent:  ? ?Plan Discussed with: Anesthesiologist and CRNA ? ?Anesthesia Plan Comments: Terri [name] is a 46 y.o. female with medical history significant  for acute upper GI bleed in the setting of peptic ulcer disease, acute lower GI bleed in 2021, essential hypertension, chronic blood loss anemia associated with baseline hemoglobin range 9-11, polysubstance abuse, portal vein thrombus, who is admitted to [facility] on 08/31/2021 with acute upper GI bleed after presenting from home to [facility] ED complaining of dark-colored stool. )  ? ? ? ? ? ? ?Anesthesia Quick Evaluation ? ?

## 2021-09-03 NOTE — Interval H&P Note (Signed)
History and Physical Interval Note: ? ?09/03/2021 ?12:02 PM ? ?Terri [name]  has presented today for surgery, with the diagnosis of melena, anemia.  The various methods of treatment have been discussed with the patient and family. After consideration of risks, benefits and other options for treatment, the patient has consented to  Procedure(s): ?ESOPHAGOGASTRODUODENOSCOPY (EGD) (N/A) as a surgical intervention.  The patient's history has been reviewed, patient examined, no change in status, stable for surgery.  I have reviewed the patient's chart and labs.  Questions were answered to the patient's satisfaction.   ? ? ?Landry Dyke ? ? ?

## 2021-09-03 NOTE — Progress Notes (Signed)
[facility] Progress Note ? ?Terri [name] 46 y.o. 1975-10-07 ? ?CC:  Melena, UGI bleed ? ? ?Subjective: ?Patient resting in bed on visit this morning. Denies nausea, vomiting, abdominal pain. Had a bowel movement last night, maroon colored stool per nursing note. BP improved since yesterday. Currently NPO and planning for EGD this morning if BP stable. ? ?ROS : Review of Systems  ?Constitutional:  Negative for chills and fever.  ?Gastrointestinal:  Positive for blood in stool. Negative for abdominal pain, constipation, diarrhea, heartburn, melena, nausea and vomiting.  ?Genitourinary:  Negative for dysuria and urgency.   ? ? ?Objective: ?Vital signs in last 24 hours: ?Vitals:  ? 09/03/21 1136 09/03/21 1310  ?BP: 104/77 (!) 91/50  ?Pulse: 90 83  ?Resp: 19 (!) 24  ?Temp: 98.3 ?F (36.8 ?C) (!) 97.5 ?F (36.4 ?C)  ?SpO2: 98% 92%  ? ? ?Physical Exam: ?General:  Alert, cooperative, no distress, thin and chronically ill-appearing  ?Head:  Normocephalic, without obvious abnormality, atraumatic  ?Eyes:  Anicteric sclera, EOM's intact  ?Lungs:   Clear to auscultation bilaterally, respirations unlabored  ?Heart:  Regular rate and rhythm, S1, S2 normal  ?Abdomen:   Soft, non-tender, bowel sounds active all four quadrants,  no masses,   ?Extremities: Extremities normal, atraumatic, no  edema  ?Pulses: 2+ and symmetric  ? ? ? ?Lab Results: ?Recent Labs  ?  08/31/21 ?1610 09/01/21 ?0452 09/02/21 ?1622  ?NA 132* 133* 132*  ?K 4.0 4.0 4.2  ?CL 102 107 109  ?CO2 23 20* 16*  ?GLUCOSE 71 46* 100*  ?BUN 72* 65* 40*  ?CREATININE 0.88 0.60 0.73  ?CALCIUM 7.5* 7.4* 7.3*  ?MG 2.0 2.0  --   ?PHOS  --   --  3.0  ? ?Recent Labs  ?  08/31/21 ?1610 09/01/21 ?0452 09/02/21 ?1622  ?AST 26 24  --   ?ALT 16 19  --   ?ALKPHOS 115 110  --   ?BILITOT 0.6 1.6*  --   ?PROT 4.6* 4.8*  --   ?ALBUMIN 1.8* 1.8* 1.7*  ? ?Recent Labs  ?  08/31/21 ?1610 09/01/21 ?0452 09/01/21 ?3762 09/02/21 ?1622  ?WBC 12.0* 15.8*  --  15.6*  ?NEUTROABS 10.4* 14.4*   --   --   ?HGB 4.6* 9.6* 9.8* 10.2*  ?HCT 13.3* 27.0* 28.0* 29.8*  ?MCV 63.3* 72.0*  --  73.6*  ?PLT 122* 96*  --  116*  ? ?Recent Labs  ?  08/31/21 ?1610 09/01/21 ?0452  ?LABPROT 16.2* 15.2  ?INR 1.3* 1.2  ? ? ? ? ?Assessment ?Upper GI bleed, melena ?- Hgb 4.6 on presentation to ED, improved to 9.6 after 3 units PRBCs ?- Hgb 10.2, stable ?- WBC 15.6, MCV 73.6, Platelets 116  ?- BUN 40, Cr 0.73, GFR >60  ?- Tbili 1.6, normal liver enzymes ?- Last EGD 12/2020 showed large gastric ulcer ?  ?- CT angio 08/31/21 - Exam severely limited by photopenia; however, no intraluminal contrast extravasation to localize GI bleeding.  Large burden of thrombus within the central portal vein as well as the right portal vein and branches as seen on recent exam dated 08/25/2021.  Multiple low-attenuation lesions throughout the liver, seen on recent prior exam, highly concerning for hepatic abscesses or metastases.  Moderate volume ascites throughout the abdomen and pelvis similar to prior. ? ? ?Plan: ?EGD this morning. ?Continue Protonix drip. ?Continue daily CBC and transfuse as needed to maintain HGB > 7  ?Continue supportive care as needed. ?[facility] will  follow.   ? ?Angelique Holm PA-C ?09/03/2021, 1:20 PM ? ?Contact #  [phone]  ?

## 2021-09-04 ENCOUNTER — Encounter (HOSPITAL_COMMUNITY): Payer: Self-pay | Admitting: Gastroenterology

## 2021-09-04 ENCOUNTER — Inpatient Hospital Stay (HOSPITAL_COMMUNITY): Payer: Medicaid Other

## 2021-09-04 DIAGNOSIS — K922 Gastrointestinal hemorrhage, unspecified: Secondary | ICD-10-CM | POA: Diagnosis not present

## 2021-09-04 DIAGNOSIS — D62 Acute posthemorrhagic anemia: Secondary | ICD-10-CM | POA: Diagnosis not present

## 2021-09-04 DIAGNOSIS — K921 Melena: Secondary | ICD-10-CM

## 2021-09-04 DIAGNOSIS — D649 Anemia, unspecified: Secondary | ICD-10-CM | POA: Diagnosis not present

## 2021-09-04 DIAGNOSIS — B3781 Candidal esophagitis: Secondary | ICD-10-CM | POA: Diagnosis not present

## 2021-09-04 MED ORDER — GADOBUTROL 1 MMOL/ML IV SOLN
5.0000 mL | Freq: Once | INTRAVENOUS | Status: AC | PRN
  Administered 2021-09-04: 5 mL via INTRAVENOUS

## 2021-09-04 MED ORDER — OXYCODONE HCL 5 MG PO TABS
5.0000 mg | ORAL_TABLET | Freq: Four times a day (QID) | ORAL | Status: DC | PRN
  Administered 2021-09-04 – 2021-09-06 (×8): 5 mg via ORAL
  Filled 2021-09-04 (×8): qty 1

## 2021-09-04 NOTE — Progress Notes (Signed)
[facility] Progress Note ? ?Terri [name] 46 y.o. 01-12-1976 ? ?CC:  Melena, UGI bleed ? ? ?Subjective: ?Patient sitting up in bedside chair this morning.  Tolerating full liquid diet.  Denies nausea, vomiting, fever, chills, abdominal pain.  Wants to go home today. ? ?ROS : Review of Systems  ?Constitutional:  Negative for chills and fever.  ?Gastrointestinal:  Negative for abdominal pain, blood in stool, constipation, diarrhea, heartburn, melena, nausea and vomiting.   ? ? ?Objective: ?Vital signs in last 24 hours: ?Vitals:  ? 09/04/21 0833 09/04/21 0853  ?BP: (!) 138/92   ?Pulse: 88   ?Resp: (!) 24 (!) 24  ?Temp: 97.6 ?F (36.4 ?C)   ?SpO2: 99% 99%  ? ? ?Physical Exam: ? ?General:  Alert, cooperative, no distress, thin and chronically ill-appearing  ?Head:  Normocephalic, without obvious abnormality, atraumatic  ?Eyes:  Anicteric sclera, EOM's intact  ?Lungs:   Clear to auscultation bilaterally, respirations unlabored  ?Heart:  Regular rate and rhythm, S1, S2 normal  ?Abdomen:   Soft, non-tender, bowel sounds active all four quadrants,  no masses,   ?Extremities: Extremities normal, atraumatic, no  edema  ?Pulses: 2+ and symmetric  ? ?Lab Results: ?Recent Labs  ?  09/02/21 ?1622  ?NA 132*  ?K 4.2  ?CL 109  ?CO2 16*  ?GLUCOSE 100*  ?BUN 40*  ?CREATININE 0.73  ?CALCIUM 7.3*  ?PHOS 3.0  ? ?Recent Labs  ?  09/02/21 ?1622  ?ALBUMIN 1.7*  ? ?Recent Labs  ?  09/02/21 ?1622  ?WBC 15.6*  ?HGB 10.2*  ?HCT 29.8*  ?MCV 73.6*  ?PLT 116*  ? ?No results for input(s): LABPROT, INR in the last 72 hours. ? ? ? ?Assessment ?Upper GI bleed, melena ?- Hgb 4.6 on presentation to ED, improved to 9.6 after 3 units PRBCs ?- Hgb 10.2, stable ?- WBC 15.6, MCV 73.6, Platelets 116  ?- BUN 40, Cr 0.73, GFR >60  ?- Tbili 1.6, normal liver enzymes ?- Last EGD 12/2020 showed large gastric ulcer ?  ?- CT angio 08/31/21 - Exam severely limited by photopenia; however, no intraluminal contrast extravasation to localize GI bleeding.  Large  burden of thrombus within the central portal vein as well as the right portal vein and branches as seen on recent exam dated 08/25/2021.  Multiple low-attenuation lesions throughout the liver, seen on recent prior exam, highly concerning for hepatic abscesses or metastases.  Moderate volume ascites throughout the abdomen and pelvis similar to prior. ? ?- EGD 09/03/2021 shows esophageal plaques consistent with candidiasis.  Large cratered nonbleeding jejunal ulcer with no stigmata of bleeding.  ? ?Plan: ?EGD 4/13 showed esophageal candidiasis and large jejunal ulcer.  ?Continue PPI, carafate, fluconazole. ?Dysphagia 3 diet. ?Continue supportive care ?[facility] will follow.  ? ?Angelique Holm PA-C ?09/04/2021, 12:56 PM ? ?Contact #  [phone]  ?

## 2021-09-04 NOTE — Progress Notes (Signed)
09/04/2021  1020  Patient states she is leaving at 1030. Patient advised that the doctor is not discharging her and she would be leaving against medical advise. Fraser Din advised she will have to be able to walk from room to lobby without assistance. Pt states she can. Pt states she is aware and is willing to sign AMA form. AMA form given to patient and she signed. IV's removed, patient belongings are given to her. Patient in the room trying to get dressed. Pt states her ride is on their way. Pt ride comes but refuses to take her home with them since she is leaving against medical advise. Pt is aware that she needs to be seen by GI and have a follow up plan before they consider discharge. ?

## 2021-09-04 NOTE — Plan of Care (Signed)
  Problem: Activity: Goal: Risk for activity intolerance will decrease Outcome: Progressing   Problem: Nutrition: Goal: Adequate nutrition will be maintained Outcome: Progressing   Problem: Elimination: Goal: Will not experience complications related to bowel motility Outcome: Progressing   

## 2021-09-04 NOTE — Progress Notes (Addendum)
[facility] ? ?PROGRESS NOTE ? ?Terri [name] PXT:[id number] DOB: [birth year] DOA: 08/31/2021 ?PCP: Pcp, No ? ? ?Brief HPI:   ?46 year old female with history of acute upper GI bleed in setting of peptic ulcer disease, acute lower GI bleeding 2021, essential hypertension, chronic blood loss anemia, polysubstance abuse, portal vein thrombosis was admitted to [facility] for acute GI bleed after presenting from home complaining of dark-colored stool.  Gastroenterology was consulted, EGD on 09/03/2021 showed esophageal plaques consistent with candidiasis, large cratered nonbleeding duodenal ulcer with no stigmata of bleeding.  CT angiogram on 08/31/2021 showed multiple low-attenuation lesions throughout the liver seen on recent prior exam, highly concerning for hepatic abscesses or metastasis. ? ? ? ?Subjective  ? ?Patient seen and examined, denies any complaints. ? ? Assessment/Plan:  ? ?Upper GI bleed ?-Patient had 2 episodes of melena at home ?-Gastroenterology consulted ?-Underwent EGD on 09/03/2021 ?-EGD showed esophageal plaques consistent with candidiasis, large cratered nonbleeding duodenal ulcer with no stigmata of bleeding ?-Today hemoglobin is stable at 10.2 ?-Continue PPI, Carafate, fluconazole ?-Patient is on dysphagia 3 diet ? ?Liver lesions ?-CTA abdomen/pelvis showed multiple low-attenuation lesions throughout the liver, highly concerning for hepatic abscesses or metastasis ?-MRI liver ordered per GI ?-Also ordered CA 19-9, CEA, AFP ? ?Portal vein thrombosis ?-Seen on CTA abdomen/pelvis ?-Not a candidate for anticoagulation ? ?Hypertension ?-Blood pressure is stable ?-Continue amlodipine, lisinopril, hydralazine ? ?History of polysubstance abuse ?-TOC consulted for outpatient substance abuse rehab ?-UDS positive for cocaine ? ? ? ?Medications ? ?  ? amLODipine  10 mg Oral Daily  ? Chlorhexidine Gluconate Cloth  6 each Topical Daily  ? fluconazole  100 mg Oral Daily  ? hydrALAZINE  50 mg Oral BID  ?  lidocaine  1 patch Transdermal Daily  ? lisinopril  10 mg Oral QHS  ? nicotine  21 mg Transdermal Daily  ? sucralfate  1 g Oral TID WC & HS  ? ? ? Data Reviewed:  ? ?CBG: ? ?Recent Labs  ?Lab 09/01/21 ?6270 09/01/21 ?0756 09/02/21 ?0742  ?GLUCAP 47* 100* 58*  ? ? ?SpO2: 100 % ?O2 Flow Rate (L/min): 1 L/min  ? ? ?Vitals:  ? 09/04/21 0500 09/04/21 0833 09/04/21 0853 09/04/21 1326  ?BP:  (!) 138/92  108/82  ?Pulse:  88  88  ?Resp:  (!) 24 (!) 24 (!) 22  ?Temp:  97.6 ?F (36.4 ?C)  (!) 97.4 ?F (36.3 ?C)  ?TempSrc:  Oral  Oral  ?SpO2:  99% 99% 100%  ?Weight: 56.3 kg     ?Height:      ? ? ? ? ?Data Reviewed: ? ?Basic Metabolic Panel: ?Recent Labs  ?Lab 08/31/21 ?1610 09/01/21 ?0452 09/02/21 ?1622  ?NA 132* 133* 132*  ?K 4.0 4.0 4.2  ?CL 102 107 109  ?CO2 23 20* 16*  ?GLUCOSE 71 46* 100*  ?BUN 72* 65* 40*  ?CREATININE 0.88 0.60 0.73  ?CALCIUM 7.5* 7.4* 7.3*  ?MG 2.0 2.0  --   ?PHOS  --   --  3.0  ? ? ?CBC: ?Recent Labs  ?Lab 08/31/21 ?1610 09/01/21 ?0452 09/01/21 ?3500 09/02/21 ?1622  ?WBC 12.0* 15.8*  --  15.6*  ?NEUTROABS 10.4* 14.4*  --   --   ?HGB 4.6* 9.6* 9.8* 10.2*  ?HCT 13.3* 27.0* 28.0* 29.8*  ?MCV 63.3* 72.0*  --  73.6*  ?PLT 122* 96*  --  116*  ? ? ?LFT ?Recent Labs  ?Lab 08/31/21 ?1610 09/01/21 ?0452 09/02/21 ?1622  ?AST 26 24  --   ?  ALT 16 19  --   ?ALKPHOS 115 110  --   ?BILITOT 0.6 1.6*  --   ?PROT 4.6* 4.8*  --   ?ALBUMIN 1.8* 1.8* 1.7*  ? ?  ?Antibiotics: ?Anti-infectives (From admission, onward)  ? ? Start     Dose/Rate Route Frequency Ordered Stop  ? 09/04/21 1000  fluconazole (DIFLUCAN) tablet 100 mg       ? 100 mg Oral Daily 09/03/21 1334 09/13/21 0959  ? 09/03/21 1500  fluconazole (DIFLUCAN) IVPB 200 mg       ? 200 mg ?100 mL/hr over 60 Minutes Intravenous  Once 09/03/21 1334 09/03/21 1637  ? 08/31/21 2030  cefTRIAXone (ROCEPHIN) 1 g in sodium chloride 0.9 % 100 mL IVPB       ? 1 g ?200 mL/hr over 30 Minutes Intravenous  Once 08/31/21 2026 08/31/21 2242  ? ?  ? ? ? ?DVT prophylaxis: SCDs ? ?Code  Status: Full code ? ?Family Communication: No family at bedside ? ? ?CONSULTS gastroenterology ? ? ?Objective  ? ? ?Physical Examination: ? ? ?General-appears in no acute distress ?Heart-S1-S2, regular, no murmur auscultated ?Lungs-clear to auscultation bilaterally, no wheezing or crackles auscultated ?Abdomen-soft, nontender, no organomegaly ?Extremities-no edema in the lower extremities ?Neuro-alert, oriented x3, no focal deficit noted ? ? ?Status is: Inpatient: GI bleed ? ? ? ?  ? ? ? ? ? ?Terri [name] ?  ?[facility] ?If 7PM-7AM, please contact night-coverage at [email or web address], ?Office  [phone] ? ? ?09/04/2021, 6:50 PM  LOS: 3 days  ? ? ? ? ? ? ? ? ? ? ?  ?

## 2021-09-05 DIAGNOSIS — D649 Anemia, unspecified: Secondary | ICD-10-CM | POA: Diagnosis not present

## 2021-09-05 DIAGNOSIS — I96 Gangrene, not elsewhere classified: Secondary | ICD-10-CM

## 2021-09-05 DIAGNOSIS — B3781 Candidal esophagitis: Secondary | ICD-10-CM | POA: Diagnosis not present

## 2021-09-05 DIAGNOSIS — D62 Acute posthemorrhagic anemia: Secondary | ICD-10-CM | POA: Diagnosis not present

## 2021-09-05 DIAGNOSIS — K922 Gastrointestinal hemorrhage, unspecified: Secondary | ICD-10-CM | POA: Diagnosis not present

## 2021-09-05 LAB — CBC
HCT: 27.7 % — ABNORMAL LOW (ref 36.0–46.0)
Hemoglobin: 9.3 g/dL — ABNORMAL LOW (ref 12.0–15.0)
MCH: 25.3 pg — ABNORMAL LOW (ref 26.0–34.0)
MCHC: 33.6 g/dL (ref 30.0–36.0)
MCV: 75.3 fL — ABNORMAL LOW (ref 80.0–100.0)
Platelets: 144 10*3/uL — ABNORMAL LOW (ref 150–400)
RBC: 3.68 MIL/uL — ABNORMAL LOW (ref 3.87–5.11)
RDW: 25.9 % — ABNORMAL HIGH (ref 11.5–15.5)
WBC: 13.5 10*3/uL — ABNORMAL HIGH (ref 4.0–10.5)
nRBC: 0 % (ref 0.0–0.2)

## 2021-09-05 MED ORDER — IPRATROPIUM-ALBUTEROL 0.5-2.5 (3) MG/3ML IN SOLN
3.0000 mL | Freq: Four times a day (QID) | RESPIRATORY_TRACT | Status: DC | PRN
Start: 2021-09-05 — End: 2021-09-19
  Administered 2021-09-06 – 2021-09-18 (×4): 3 mL via RESPIRATORY_TRACT
  Filled 2021-09-05 (×5): qty 3

## 2021-09-05 MED ORDER — GUAIFENESIN-DM 100-10 MG/5ML PO SYRP
10.0000 mL | ORAL_SOLUTION | ORAL | Status: DC | PRN
Start: 2021-09-05 — End: 2021-09-20
  Administered 2021-09-05 – 2021-09-16 (×3): 10 mL via ORAL
  Filled 2021-09-05 (×4): qty 10

## 2021-09-05 MED ORDER — IPRATROPIUM-ALBUTEROL 0.5-2.5 (3) MG/3ML IN SOLN
3.0000 mL | Freq: Four times a day (QID) | RESPIRATORY_TRACT | Status: DC
  Filled 2021-09-05: qty 3

## 2021-09-05 MED ORDER — DIPHENHYDRAMINE HCL 25 MG PO CAPS
25.0000 mg | ORAL_CAPSULE | Freq: Once | ORAL | Status: AC
  Administered 2021-09-05: 25 mg via ORAL
  Filled 2021-09-05: qty 1

## 2021-09-05 MED ORDER — PANTOPRAZOLE SODIUM 40 MG IV SOLR
40.0000 mg | Freq: Two times a day (BID) | INTRAVENOUS | Status: DC
  Administered 2021-09-06 – 2021-09-14 (×18): 40 mg via INTRAVENOUS
  Filled 2021-09-05 (×19): qty 10

## 2021-09-05 MED ORDER — GUAIFENESIN ER 600 MG PO TB12
600.0000 mg | ORAL_TABLET | Freq: Two times a day (BID) | ORAL | Status: DC
Start: 2021-09-05 — End: 2021-09-20
  Administered 2021-09-05 – 2021-09-18 (×24): 600 mg via ORAL
  Filled 2021-09-05 (×26): qty 1

## 2021-09-05 NOTE — Consult Note (Deleted)
Duplicate

## 2021-09-05 NOTE — Consult Note (Addendum)
? ?ASSESSMENT & PLAN  ? ?DRY GANGRENE LEFT GREAT TOE: This patient has dry gangrene on the tip of the left great toe.  She has palpable pedal pulses and multiphasic flow in both feet.  Thus she should have adequate circulation to heal this.  The etiology of this is not clear.  It could be a simple as a injury to her toe.  However the differential diagnosis would also include atheroembolic disease.  Her CT angiogram from November 22 does show significant aortoiliac atherosclerotic disease and this could certainly be a source of embolization.  However the patient currently is markedly debilitated with multiple medical issues I think this is a secondary issue currently.  I will arrange to see her back in the office in 4 to 6 weeks we can consider a more aggressive work-up with arteriography and possible chest CT if she has shown significant clinical improvement.  I have ordered ABIs to have as a baseline. ? ?Vascular surgery will be available as needed. ? ? ?REASON FOR CONSULT:   ? ?Dry gangrene left great toe.  The consult is requested by Dr. Darrick Meigs. ? ?HPI:  ? ?Terri [name] is a 46 y.o. female who was admitted on 08/31/21 with dark-colored stool.  She has had a previous history of lower GI bleed in 2021.  It is felt that the bleeding is likely from a large gastric remnant ulcer which could be multifactorial in origin.  The patient also has portal vein thrombosis and some small liver lesions of unclear etiology.  She also has significant ascites. ? ?On my history, the patient tells me that she developed some blackish discoloration of her left great toe over 3 weeks ago.  Apparently this was noticed on exam today and vascular surgery was consulted. ? ?Prior to her admission she denies any history of claudication, rest pain, or nonhealing ulcers. ? ?Her risk factors for peripheral arterial disease include hypertension and tobacco use.  She smokes 1 pack/day and has been smoking for many years.  She denies any history of  diabetes, hypercholesterolemia, or family history of premature cardiovascular disease. ? ?Past Medical History:  ?Diagnosis Date  ? Arthritis   ? Back and legs  ? Chronic back pain   ? DVT (deep venous thrombosis) ([place])   ? early 20's leg  ? GIB (gastrointestinal bleeding) 09/13/2017  ? History of blood transfusion   ? Hypertension   ? Migraine headache   ? Mood disorder ([place])   ? previously documented as bipolar and MDD  ? Neuropathy   ? Pneumonia   ? 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 Sciatica   ? Stress incontinence   ? ? ?Family History  ?Problem Relation Age of Onset  ? Diabetes Mother   ? Hypertension Mother   ? Diabetes Father   ? Hypertension Father   ? ? ?SOCIAL HISTORY: ?Social History  ? ?Tobacco Use  ? Smoking status: Every Day  ?  Packs/day: 1.00  ?  Years: 29.00  ?  Pack years: 29.00  ?  Types: Cigarettes  ? Smokeless tobacco: Never  ?Substance Use Topics  ? Alcohol use: No  ? ? ?Allergies  ?Allergen Reactions  ? Nsaids Other (See Comments)  ?  Stomach ulcer ?  ? Ketorolac Tromethamine Itching and Nausea And Vomiting  ? ? ?Current Facility-Administered Medications  ?Medication Dose Route Frequency Provider Last Rate Last Admin  ? acetaminophen (TYLENOL) tablet 650 mg  650 mg Oral  Q6H PRN Arta Silence, MD   650 mg at 09/05/21 4782  ? Or  ? acetaminophen (TYLENOL) suppository 650 mg  650 mg Rectal Q6H PRN Arta Silence, MD      ? amLODipine (NORVASC) tablet 10 mg  10 mg Oral Daily Arta Silence, MD   10 mg at 09/05/21 0931  ? Chlorhexidine Gluconate Cloth 2 % PADS 6 each  6 each Topical Daily Arta Silence, MD   6 each at 09/05/21 1050  ? fluconazole (DIFLUCAN) tablet 100 mg  100 mg Oral Daily Arta Silence, MD   100 mg at 09/05/21 0931  ? guaiFENesin (MUCINEX) 12 hr tablet 600 mg  600 mg Oral BID [name], MD   600 mg at 09/05/21 1050  ? hydrALAZINE (APRESOLINE) tablet 50 mg  50 mg Oral BID Arta Silence, MD   50 mg at 09/05/21 0931  ? ipratropium-albuterol (DUONEB) 0.5-2.5 (3)  MG/3ML nebulizer solution 3 mL  3 mL Nebulization Q6H PRN [name], MD      ? labetalol (NORMODYNE) injection 10 mg  10 mg Intravenous Q2H PRN Arta Silence, MD   10 mg at 09/02/21 1120  ? lidocaine (LIDODERM) 5 % 1 patch  1 patch Transdermal Daily Arta Silence, MD   1 patch at 09/02/21 1719  ? lip balm (CARMEX) ointment   Topical PRN Arta Silence, MD   75 application. at 09/01/21 0037  ? lisinopril (ZESTRIL) tablet 10 mg  10 mg Oral QHS Arta Silence, MD   10 mg at 09/04/21 2348  ? naloxone [facility]) injection 0.4 mg  0.4 mg Intravenous PRN Arta Silence, MD      ? nicotine (NICODERM CQ - dosed in mg/24 hours) patch 21 mg  21 mg Transdermal Daily Vanna Scotland, MD   21 mg at 09/05/21 0933  ? ondansetron (ZOFRAN) injection 4 mg  4 mg Intravenous Q6H PRN Arta Silence, MD      ? oxyCODONE (Oxy IR/ROXICODONE) immediate release tablet 5 mg  5 mg Oral Q6H PRN [name], MD   5 mg at 09/05/21 0931  ? pantoprazole (PROTONIX) injection 40 mg  40 mg Intravenous Q12H Magod, Altamese Dilling, MD      ? sucralfate (CARAFATE) 1 GM/10ML suspension 1 g  1 g Oral TID WC & HS Arta Silence, MD   1 g at 09/05/21 0932  ? ? ?REVIEW OF SYSTEMS:  ?'[X]'$  denotes positive finding, '[ ]'$  denotes negative finding ?Cardiac  Comments:  ?Chest pain or chest pressure: x   ?Shortness of breath upon exertion: x   ?Short of breath when lying flat: x   ?Irregular heart rhythm:    ?    ?Vascular    ?Pain in calf, thigh, or hip brought on by ambulation:    ?Pain in feet at night that wakes you up from your sleep:     ?Blood clot in your veins:    ?Leg swelling:     ?    ?Pulmonary    ?Oxygen at home:    ?Productive cough:  x   ?Wheezing:     ?    ?Neurologic    ?Sudden weakness in arms or legs:     ?Sudden numbness in arms or legs:     ?Sudden onset of difficulty speaking or slurred speech:    ?Temporary loss of vision in one eye:     ?Problems with dizziness:     ?    ?Gastrointestinal    ?Blood in stool:  x   ?Vomited blood:     ?     ?Genitourinary    ?Burning when urinating:     ?Blood in urine:    ?    ?Psychiatric    ?Major depression:     ?    ?Hematologic    ?Bleeding problems:    ?Problems with blood clotting too easily:    ?    ?Skin    ?Rashes or ulcers: x   ?    ?Constitutional    ?Fever or chills:    ?- ? ?PHYSICAL EXAM:  ? ?Vitals:  ? 09/04/21 2348 09/04/21 2349 09/05/21 0605 09/05/21 1512  ?BP: (!) 158/109 (!) 158/109 (!) 142/76 116/81  ?Pulse:  (!) 104 (!) 106 89  ?Resp:  '16 18 18  '$ ?Temp:  98.2 ?F (36.8 ?C) 98.4 ?F (36.9 ?C) 97.9 ?F (36.6 ?C)  ?TempSrc:  Oral Oral Oral  ?SpO2:  96% 98% 97%  ?Weight:      ?Height:      ? ?Body mass index is 18.33 kg/m?. ?GENERAL: The patient is a well-nourished female, in no acute distress. The vital signs are documented above. ?CARDIAC: There is a regular rate and rhythm.  ?VASCULAR: I do not detect carotid bruits. ?She has palpable femoral, popliteal, dorsalis pedis, and posterior tibial pulses bilaterally. ?She has multiphasic Doppler signals in the dorsalis pedis and posterior tibial positions bilaterally. ?She has dry gangrene on the tip of the left great toe as documented in the photograph below. ? ? ?She has a crack on her left heel ? ? ?She has a crack on her right great toe ? ? ? ?PULMONARY: There is good air exchange bilaterally without wheezing or rales. ?ABDOMEN: Soft, mildly tender with normal pitched bowel sounds.  I do not palpate any aneurysm. ?MUSCULOSKELETAL: There are no major deformities. ?NEUROLOGIC: No focal weakness or paresthesias are detected. ?SKIN: There are no ulcers or rashes noted. ?PSYCHIATRIC: The patient has a normal affect. ? ?DATA:   ? ?LABS: Her GFR is greater than 60.  Creatinine is 0.73. ? ?CT ANGIO ABDOMEN PELVIS: I did review her CT of the abdomen and pelvis that was done in November 2022.  She has diffuse calcific disease in her aorta, common iliac, and external iliac arteries.  Certainly this could be a source of embolization. ? ?Deitra Mayo ?[facility] ? ?

## 2021-09-05 NOTE — Progress Notes (Signed)
[facility] ? ?PROGRESS NOTE ? ?Terri [name] ZWC:[id number] DOB: 1975/10/07 DOA: 08/31/2021 ?PCP: Pcp, No ? ? ?Brief HPI:   ?46 year old female with history of acute upper GI bleed in setting of peptic ulcer disease, acute lower GI bleeding 2021, essential hypertension, chronic blood loss anemia, polysubstance abuse, portal vein thrombosis was admitted to [facility] for acute GI bleed after presenting from home complaining of dark-colored stool.  Gastroenterology was consulted, EGD on 09/03/2021 showed esophageal plaques consistent with candidiasis, large cratered nonbleeding duodenal ulcer with no stigmata of bleeding.  CT angiogram on 08/31/2021 showed multiple low-attenuation lesions throughout the liver seen on recent prior exam, highly concerning for hepatic abscesses or metastasis. ? ? ? ?Subjective  ? ?Patient seen and examined, denies new complaints.  MRI of liver shows multiple metastasis. ? ? Assessment/Plan:  ? ?Upper GI bleed ?-Patient had 2 episodes of melena at home ?-Gastroenterology consulted ?-Underwent EGD on 09/03/2021 ?-EGD showed esophageal plaques consistent with candidiasis, large cratered nonbleeding duodenal ulcer with no stigmata of bleeding ?-Today hemoglobin is stable at 10.2 ?-Continue PPI, Carafate, fluconazole ?-Patient is on dysphagia 3 diet ? ?Liver lesions ?-CTA abdomen/pelvis showed multiple low-attenuation lesions throughout the liver, highly concerning for hepatic abscesses or metastasis ?-MRI liver shows hepatic metastasis. ?-We will consult IR for liver biopsy ?-CA 19-9, CEA, AFP have been collected, result is currently pending ? ?Portal vein thrombosis ?-Seen on CTA abdomen/pelvis ?-Not a candidate for anticoagulation ? 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 ?Hypertension ?-Blood pressure is stable ?-Continue amlodipine, lisinopril, hydralazine ? ?History of polysubstance abuse ?-TOC  consulted for outpatient substance abuse rehab ?-UDS positive for cocaine ? ? ? ?Medications ? ?  ? amLODipine  10 mg Oral Daily  ? Chlorhexidine Gluconate Cloth  6 each Topical Daily  ? fluconazole  100 mg Oral Daily  ? guaiFENesin  600 mg Oral BID  ? hydrALAZINE  50 mg Oral BID  ? lidocaine  1 patch Transdermal Daily  ? lisinopril  10 mg Oral QHS  ? nicotine  21 mg Transdermal Daily  ? pantoprazole (PROTONIX) IV  40 mg Intravenous Q12H  ? sucralfate  1 g Oral TID WC & HS  ? ? ? Data Reviewed:  ? ?CBG: ? ?Recent Labs  ?Lab 09/01/21 ?2353 09/01/21 ?0756 09/02/21 ?0742  ?GLUCAP 47* 100* 58*  ? ? ?SpO2: 97 % ?O2 Flow Rate (L/min): 1 L/min  ? ? ?Vitals:  ? 09/04/21 2348 09/04/21 2349 09/05/21 0605 09/05/21 1512  ?BP: (!) 158/109 (!) 158/109 (!) 142/76 116/81  ?Pulse:  (!) 104 (!) 106 89  ?Resp:  '16 18 18  '$ ?Temp:  98.2 ?F (36.8 ?C) 98.4 ?F (36.9 ?C) 97.9 ?F (36.6 ?C)  ?TempSrc:  Oral Oral Oral  ?SpO2:  96% 98% 97%  ?Weight:      ?Height:      ? ? ? ? ?Data Reviewed: ? ?Basic Metabolic Panel: ?Recent Labs  ?Lab 08/31/21 ?1610 09/01/21 ?0452 09/02/21 ?1622  ?NA 132* 133* 132*  ?K 4.0 4.0 4.2  ?CL 102 107 109  ?CO2 23 20* 16*  ?GLUCOSE 71 46* 100*  ?BUN 72* 65* 40*  ?CREATININE 0.88 0.60 0.73  ?CALCIUM 7.5* 7.4* 7.3*  ?MG 2.0 2.0  --   ?PHOS  --   --  3.0  ? ? ?CBC: ?Recent Labs  ?Lab 08/31/21 ?1610 09/01/21 ?0452 09/01/21 ?6144 09/02/21 ?1622 09/05/21 ?0618  ?WBC 12.0* 15.8*  --  15.6* 13.5*  ?NEUTROABS 10.4* 14.4*  --   --   --   ?HGB 4.6* 9.6* 9.8* 10.2* 9.3*  ?HCT 13.3* 27.0* 28.0* 29.8* 27.7*  ?MCV 63.3* 72.0*  --  73.6* 75.3*  ?PLT 122* 96*  --  116* 144*  ? ? ?LFT ?Recent Labs  ?Lab 08/31/21 ?1610 09/01/21 ?0452 09/02/21 ?1622  ?AST 26 24  --   ?ALT 16 19  --   ?ALKPHOS 115 110  --   ?BILITOT 0.6 1.6*  --   ?PROT 4.6* 4.8*  --   ?ALBUMIN 1.8* 1.8* 1.7*  ? ?  ?Antibiotics: ?Anti-infectives (From admission, onward)  ? ? Start     Dose/Rate Route Frequency Ordered Stop  ? 09/04/21 1000  fluconazole (DIFLUCAN) tablet  100 mg       ? 100 mg Oral Daily 09/03/21 1334 09/13/21 0959  ? 09/03/21 1500  fluconazole (DIFLUCAN) IVPB 200 mg       ? 200 mg ?100 mL/hr over 60 Minutes Intravenous  Once 09/03/21 1334 09/03/21 1637  ? 08/31/21 2030  cefTRIAXone (ROCEPHIN) 1 g in sodium chloride 0.9 % 100 mL IVPB       ? 1 g ?200 mL/hr over 30 Minutes Intravenous  Once 08/31/21 2026 08/31/21 2242  ? ?  ? ? ? ?DVT prophylaxis: SCDs ? ?Code Status: Full code ? ?Family Communication: No family at bedside ? ? ?CONSULTS gastroenterology ? ? ?Objective  ? ? ?Physical Examination: ? ? ?General-appears in no acute distress ?Heart-S1-S2, regular, no murmur auscultated ?Lungs-clear to auscultation bilaterally, no wheezing or crackles auscultated ?Abdomen-soft, nontender, no organomegaly ?Extremities-black discoloration noted on the tip of left toe, peripheral pulses are palpable ?Neuro-alert, oriented x3, no focal deficit noted ? ? ?Status is: Inpatient: GI bleed ? ? ? ?  ? ? ? ? ? ?Terri [name] ?  ?[facility] ?If 7PM-7AM, please contact night-coverage at [email or web address], ?Office  [phone] ? ? ?09/05/2021, 3:19 PM  LOS: 4 days  ? ? ? ? ? ? ? ? ? ? ?  ?

## 2021-09-05 NOTE — Progress Notes (Signed)
Terri [name] ?11:21 AM ? ?Subjective: ?Patient seen and examined and case discussed with her family member as well as my partner Dr. Paulita Fujita and she has dark formed stool and no new complaints and we discussed her MRI and answered all of their questions and is tolerating diet ? ?Objective: ?Vital signs stable afebrile no acute distress abdomen is soft minimally tender no significant ascites on exam hemoglobin slight drop no new BUN and a while tumor markers pending MRI worrisome for metastatic disease ? ?Assessment: ?Multiple medical issues including GI blood loss and anastomotic ulceration as well as abnormal CT and MRI worrisome for metastatic cancer ? ?Plan: ?I would recommend the hospital team discussing her case with IR to see if either paracentesis and/or liver biopsy is able to be done while we await tumor markers and please call me tomorrow if any question or problem from a GI standpoint otherwise we will have rounding team check on  Monday ? ?[facility] E ? ?office [phone] ?After 5PM or if no answer call [phone]  ?

## 2021-09-06 ENCOUNTER — Encounter (HOSPITAL_COMMUNITY): Payer: Self-pay | Admitting: Internal Medicine

## 2021-09-06 ENCOUNTER — Inpatient Hospital Stay (HOSPITAL_COMMUNITY): Payer: Medicaid Other

## 2021-09-06 DIAGNOSIS — K769 Liver disease, unspecified: Secondary | ICD-10-CM | POA: Diagnosis not present

## 2021-09-06 DIAGNOSIS — I81 Portal vein thrombosis: Secondary | ICD-10-CM | POA: Diagnosis not present

## 2021-09-06 DIAGNOSIS — I739 Peripheral vascular disease, unspecified: Secondary | ICD-10-CM

## 2021-09-06 DIAGNOSIS — K922 Gastrointestinal hemorrhage, unspecified: Secondary | ICD-10-CM | POA: Diagnosis not present

## 2021-09-06 DIAGNOSIS — I10 Essential (primary) hypertension: Secondary | ICD-10-CM | POA: Diagnosis not present

## 2021-09-06 LAB — CANCER ANTIGEN 19-9: CA 19-9: 34 U/mL (ref 0–35)

## 2021-09-06 LAB — CBC WITH DIFFERENTIAL/PLATELET
Abs Immature Granulocytes: 0.23 10*3/uL — ABNORMAL HIGH (ref 0.00–0.07)
Basophils Absolute: 0 10*3/uL (ref 0.0–0.1)
Basophils Relative: 0 %
Eosinophils Absolute: 0.1 10*3/uL (ref 0.0–0.5)
Eosinophils Relative: 1 %
HCT: 28.1 % — ABNORMAL LOW (ref 36.0–46.0)
Hemoglobin: 9.4 g/dL — ABNORMAL LOW (ref 12.0–15.0)
Immature Granulocytes: 2 %
Lymphocytes Relative: 10 %
Lymphs Abs: 1.2 10*3/uL (ref 0.7–4.0)
MCH: 25.3 pg — ABNORMAL LOW (ref 26.0–34.0)
MCHC: 33.5 g/dL (ref 30.0–36.0)
MCV: 75.7 fL — ABNORMAL LOW (ref 80.0–100.0)
Monocytes Absolute: 0.5 10*3/uL (ref 0.1–1.0)
Monocytes Relative: 4 %
Neutro Abs: 10.1 10*3/uL — ABNORMAL HIGH (ref 1.7–7.7)
Neutrophils Relative %: 83 %
Platelets: 119 10*3/uL — ABNORMAL LOW (ref 150–400)
RBC: 3.71 MIL/uL — ABNORMAL LOW (ref 3.87–5.11)
RDW: 25.3 % — ABNORMAL HIGH (ref 11.5–15.5)
WBC: 12.1 10*3/uL — ABNORMAL HIGH (ref 4.0–10.5)
nRBC: 0 % (ref 0.0–0.2)

## 2021-09-06 LAB — COMPREHENSIVE METABOLIC PANEL
ALT: 28 U/L (ref 0–44)
AST: 25 U/L (ref 15–41)
Albumin: 1.6 g/dL — ABNORMAL LOW (ref 3.5–5.0)
Alkaline Phosphatase: 186 U/L — ABNORMAL HIGH (ref 38–126)
Anion gap: 6 (ref 5–15)
BUN: 22 mg/dL — ABNORMAL HIGH (ref 6–20)
CO2: 21 mmol/L — ABNORMAL LOW (ref 22–32)
Calcium: 7.5 mg/dL — ABNORMAL LOW (ref 8.9–10.3)
Chloride: 110 mmol/L (ref 98–111)
Creatinine, Ser: 0.6 mg/dL (ref 0.44–1.00)
GFR, Estimated: >60 mL/min (ref >60)
Glucose, Bld: 59 mg/dL — ABNORMAL LOW (ref 70–99)
Potassium: 3.4 mmol/L — ABNORMAL LOW (ref 3.5–5.1)
Sodium: 137 mmol/L (ref 135–145)
Total Bilirubin: 0.6 mg/dL (ref 0.3–1.2)
Total Protein: 4.8 g/dL — ABNORMAL LOW (ref 6.5–8.1)

## 2021-09-06 LAB — PROTIME-INR
INR: 1.2 (ref 0.8–1.2)
Prothrombin Time: 14.9 seconds (ref 11.4–15.2)

## 2021-09-06 LAB — CEA: CEA: 5.2 ng/mL — ABNORMAL HIGH (ref 0.0–4.7)

## 2021-09-06 LAB — AFP TUMOR MARKER: AFP, Serum, Tumor Marker: <1.8 ng/mL (ref 0.0–6.4)

## 2021-09-06 MED ORDER — MORPHINE SULFATE (PF) 2 MG/ML IV SOLN
INTRAVENOUS | Status: AC
  Filled 2021-09-06: qty 1

## 2021-09-06 MED ORDER — MORPHINE SULFATE (PF) 2 MG/ML IV SOLN
1.0000 mg | Freq: Once | INTRAVENOUS | Status: AC
  Administered 2021-09-06: 1 mg via INTRAVENOUS

## 2021-09-06 MED ORDER — OXYCODONE HCL 5 MG PO TABS
10.0000 mg | ORAL_TABLET | Freq: Four times a day (QID) | ORAL | Status: DC | PRN
  Administered 2021-09-06 – 2021-09-08 (×6): 10 mg via ORAL
  Filled 2021-09-06 (×6): qty 2

## 2021-09-06 MED ORDER — OXYCODONE HCL 5 MG PO TABS
5.0000 mg | ORAL_TABLET | ORAL | Status: AC
  Administered 2021-09-06: 5 mg via ORAL
  Filled 2021-09-06: qty 1

## 2021-09-06 MED ORDER — POTASSIUM CHLORIDE CRYS ER 20 MEQ PO TBCR: 40.0000 meq | EXTENDED_RELEASE_TABLET | Freq: Once | ORAL | Status: DC

## 2021-09-06 MED ORDER — DIPHENHYDRAMINE HCL 50 MG/ML IJ SOLN
25.0000 mg | Freq: Once | INTRAMUSCULAR | Status: AC
Start: 2021-09-06 — End: 2021-09-06
  Administered 2021-09-06: 25 mg via INTRAVENOUS
  Filled 2021-09-06: qty 1

## 2021-09-06 NOTE — Progress Notes (Signed)
ABI's have been completed. ?Preliminary results can be found in CV Proc through chart review.  ? ?09/06/21 10:00 AM ?Carlos Levering RVT   ?

## 2021-09-06 NOTE — Progress Notes (Signed)
Pt remains in [place] [facility] with r 32 at this time. ?Alert and oriented. ?Have received bed on Progressive unit. ?Pt will be moved to Room 1425. ?

## 2021-09-06 NOTE — Significant Event (Signed)
Rapid Response Event Note  ? ?Reason for Call : Increased WOB with Shortness of breath.  ? ?Initial Focused Assessment: Increased WOB, patient placed on 3 liters of oxygen. Compliant of not able catch her air. States " I can't breath.. Heart Rate of 104, Respiration  @ 34 after one MG of Morphine. Patient is A/O x 4.  ? ?Interventions:  ?Respiratory TX ?Continuous Pulse OX ?Notified Dr. Darrick Meigs  ? ?Plan of Care:  ?Transfer to Progressive  ? ? ?MD Notified:  Dr. Darrick Meigs  ?Call Time: 1445 ?Arrival Time: 8850  ?End Time: 2774  ? ?Terri Carbo Rosalene Wardrop, RN ?

## 2021-09-06 NOTE — Progress Notes (Signed)
Pt remains in Room 1518 as there are no beds available at this time. Close monitoring continues and pt will be moved to Progressive Unit ASAP. ?MD and RR are aware of pt condition. ?RR at 38 and HR 106.  ?Continuous pulse ox applied. ?Telemetry. ?

## 2021-09-06 NOTE — Progress Notes (Signed)
Have given report to receiving nurse on fourth floor.  ?Will be transferring patient soon. ?

## 2021-09-06 NOTE — Progress Notes (Signed)
RT called to room 1518 for rapid response. RT gave neb tx for SOB and WOB. No futher orders for RT at this time.  ?

## 2021-09-06 NOTE — Progress Notes (Signed)
Terri [name] ?9:45 AM ? ?Subjective: ?Patient seen chart reviewed case discussed with her nurse as well and no further signs of bleeding and no new complaints ? ?Objective: ?Vital signs stable afebrile patient not examined today BUN decreased alk phos up a little hemoglobin stable tumor markers pending ? ?Assessment: ?Multiple medical problems concerns over hepatic mets ? ?Plan: ?See yesterday note for IR consult to consider either paracentesis and/or liver biopsy and await tumor markers ? ?Terri [name] E ? ?office [phone] ?After 5PM or if no answer call [phone]  ?

## 2021-09-06 NOTE — H&P (Signed)
Chief Complaint: Patient was seen in consultation today for liver lesion biopsy at the request of Mauro Kaufmann, MD Referring Physician(s): Mauro Kaufmann, MD  Supervising Physician: Oley Balm  Patient Status: [facility] - In-pt  History of Present Illness: Terri [name] is a 46 y.o. female with acute upper GI bleed, hypertension, chronic blood loss anemia, polysubstance abuse and portal vein thrombosis was admitted after presenting to the emergency department 08/31/2021 complaining of dark-colored stools. Patient underwent EGD 09/03/2021 that showed esophageal plaques consistent with candidiasis an large cratered nonbleeding duodenal ulcer with no evidence of active bleeding. MR abdomen 09/04/2021 that showed multiple heterogenous masses throughout the liver, left retroperitoneal lymph nodes suspicious for metastasis and large volume ascites throughout the abdomen.  Patient was referred to IR by Dr. Mauro Kaufmann, MD, for liver lesion biopsy.  Procedure was approved by Jetty Peeks, MD.  MR abdomen 09/04/2021: IMPRESSION: 1. Multiple heterogeneous masses throughout the liver with rim and thick internal septal enhancement. There is additionally a large burden of expansile thrombus in the left and right portal veins as well as the central portal vein. Liver masses appear to be of solid composition and are highly concerning for metastases with tumor thrombus; abscesses remain a general differential consideration. 2. Enlarged left retroperitoneal lymph nodes, suspicious for nodal metastases 3. Large volume ascites throughout the abdomen, increased compared to prior examination. 4. New small right, moderate left pleural effusions and associated atelectasis or consolidation.  Past Medical History:  Diagnosis Date   Arthritis    Back and legs   Chronic back pain    DVT (deep venous thrombosis) (HCC)    early 20's leg   GIB (gastrointestinal bleeding) 09/13/2017   History of blood transfusion     Hypertension    Migraine headache    Mood disorder (HCC)    previously documented as bipolar and MDD   Neuropathy    Pneumonia    Positive PPD    x 2 last time 09/2017   Sciatica    Stress incontinence     Past Surgical History:  Procedure Laterality Date   ALVEOLOPLASTY Bilateral 01/31/2018   Procedure: ALVEOLOPLASTY;  Surgeon: Ocie Doyne, DDS;  Location: [facility] OR;  Service: Oral Surgery;  Laterality: Bilateral;   ANKLE SURGERY Left 1992   with hardware   BACK SURGERY     BIOPSY  02/08/2019   Procedure: BIOPSY;  Surgeon: Meridee Score Netty Starring., MD;  Location: [facility];  Service: Gastroenterology;;   CESAREAN SECTION     CHOLECYSTECTOMY N/A 02/15/2017   Procedure: LAPAROSCOPIC CHOLECYSTECTOMY;  Surgeon: Berna Bue, MD;  Location: [facility];  Service: General;  Laterality: N/A;   ENTEROSCOPY N/A 02/08/2019   Procedure: ENTEROSCOPY;  Surgeon: Meridee Score Netty Starring., MD;  Location: [facility];  Service: Gastroenterology;  Laterality: N/A;   ESOPHAGOGASTRODUODENOSCOPY N/A 01/17/2021   Procedure: ESOPHAGOGASTRODUODENOSCOPY (EGD);  Surgeon: Charna Elizabeth, MD;  Location: [facility];  Service: Endoscopy;  Laterality: N/A;   ESOPHAGOGASTRODUODENOSCOPY N/A 09/03/2021   Procedure: ESOPHAGOGASTRODUODENOSCOPY (EGD);  Surgeon: Willis Modena, MD;  Location: [facility];  Service: Gastroenterology;  Laterality: N/A;   ESOPHAGOGASTRODUODENOSCOPY (EGD) WITH PROPOFOL N/A 09/15/2017   Procedure: ESOPHAGOGASTRODUODENOSCOPY (EGD) WITH PROPOFOL;  Surgeon: Vida Rigger, MD;  Location: [facility];  Service: Endoscopy;  Laterality: N/A;   ESOPHAGOGASTRODUODENOSCOPY (EGD) WITH PROPOFOL N/A 02/08/2019   Procedure: ESOPHAGOGASTRODUODENOSCOPY (EGD) WITH PROPOFOL;  Surgeon: Meridee Score Netty Starring., MD;  Location: [facility];  Service: Gastroenterology;  Laterality: N/A;   GASTRIC BYPASS     INCISION  AND DRAINAGE OF PERITONSILLAR ABCESS Left 08/13/2017   Procedure: INCISION AND DRAINAGE OF LEFT NECK  ABSCESS;  Surgeon: Christia Reading, MD;  Location: [facility];  Service: ENT;  Laterality: Left;   LAPAROSCOPIC LYSIS OF ADHESIONS  02/15/2017   Procedure: LAPAROSCOPIC LYSIS OF ADHESIONS;  Surgeon: Berna Bue, MD;  Location: [facility];  Service: General;;   LUMBAR DISC SURGERY     LUMBAR FUSION     SAVORY DILATION N/A 02/08/2019   Procedure: SAVORY DILATION;  Surgeon: Lemar Lofty., MD;  Location: [facility];  Service: Gastroenterology;  Laterality: N/A;   TOOTH EXTRACTION Bilateral 01/31/2018   Procedure: DENTAL RESTORATION/EXTRACTIONS;  Surgeon: Ocie Doyne, DDS;  Location: [facility] OR;  Service: Oral Surgery;  Laterality: Bilateral;   TUBAL LIGATION     UPPER GI ENDOSCOPY N/A 02/15/2017   Procedure: UPPER GI ENDOSCOPY;  Surgeon: Berna Bue, MD;  Location: [facility];  Service: General;  Laterality: N/A;    Allergies: Nsaids and Ketorolac tromethamine  Medications: Prior to Admission medications   Medication Sig Start Date End Date Taking? Authorizing Provider  acetaminophen (TYLENOL) 500 MG tablet Take 2 tablets (1,000 mg total) by mouth every 8 (eight) hours as needed for mild pain. Patient not taking: Reported on 09/01/2021 05/29/21   Narda Bonds, MD  amLODipine (NORVASC) 10 MG tablet Take 1 tablet (10 mg total) by mouth daily. Patient not taking: Reported on 09/01/2021 05/30/21 08/28/21  Dartha Lodge, PA-C  Ferrous Fumarate (IRON) 18 MG TBCR Take 1 tablet (18 mg total) by mouth 2 (two) times daily after a meal. Patient not taking: Reported on 09/01/2021 04/06/21   Zannie Cove, MD  hydrALAZINE (APRESOLINE) 50 MG tablet Take 1 tablet (50 mg total) by mouth 2 (two) times daily. Patient not taking: Reported on 09/01/2021 05/30/21 08/28/21  Dartha Lodge, PA-C  hydrOXYzine (ATARAX/VISTARIL) 25 MG tablet Take 1 tablet (25 mg total) by mouth every 8 (eight) hours as needed for anxiety. Patient not taking: Reported on 09/01/2021 04/06/21   Zannie Cove, MD  lisinopril (ZESTRIL) 10 MG  tablet Take 1 tablet (10 mg total) by mouth daily. Patient not taking: Reported on 09/01/2021 05/30/21 08/28/21  Dartha Lodge, PA-C  nicotine (NICODERM CQ - DOSED IN MG/24 HOURS) 21 mg/24hr patch Place 1 patch (21 mg total) onto the skin daily. Patient not taking: Reported on 09/01/2021 04/07/21   Zannie Cove, MD  ondansetron (ZOFRAN-ODT) 4 MG disintegrating tablet Take 1 tablet (4 mg total) by mouth every 8 (eight) hours as needed for nausea or vomiting. Patient not taking: Reported on 09/01/2021 04/21/21   Gailen Shelter, PA  pantoprazole (PROTONIX) 40 MG tablet Take 1 tablet (40 mg total) by mouth daily. Patient not taking: Reported on 09/01/2021 03/18/21   Alberteen Sam, MD  vitamin B-12 (CYANOCOBALAMIN) 1000 MCG tablet Take 1 tablet (1,000 mcg total) by mouth daily. Patient not taking: Reported on 09/01/2021 04/06/21   Zannie Cove, MD     Family History  Problem Relation Age of Onset   Diabetes Mother    Hypertension Mother    Diabetes Father    Hypertension Father     Social History   Socioeconomic History   Marital status: Divorced    Spouse name: Not on file   Number of children: Not on file   Years of education: Not on file   Highest education level: Not on file  Occupational History   Not on file  Tobacco Use   Smoking  status: Every Day    Packs/day: 1.00    Years: 29.00    Pack years: 29.00    Types: Cigarettes   Smokeless tobacco: Never  Vaping Use   Vaping Use: Never used  Substance and Sexual Activity   Alcohol use: No   Drug use: Yes    Types: Cocaine, Heroin    Comment: Patient states she smokes cocaine   Sexual activity: Yes    Birth control/protection: None  Other Topics Concern   Not on file  Social History Narrative   Not on file   Social Determinants of Health   Financial Resource Strain: Not on file  Food Insecurity: Not on file  Transportation Needs: Not on file  Physical Activity: Not on file  Stress: Not on file  Social  Connections: Not on file    Review of Systems: A 12 point ROS discussed and pertinent positives are indicated in the HPI above.  All other systems are negative.  Review of Systems  Constitutional:  Positive for appetite change. Negative for chills and fever.  Eyes:  Negative for visual disturbance.  Respiratory:  Negative for cough and shortness of breath.   Cardiovascular:  Positive for leg swelling. Negative for chest pain.  Gastrointestinal:  Positive for abdominal pain. Negative for nausea and vomiting.  Neurological:  Negative for dizziness and headaches.   Vital Signs: BP (!) 130/94 (BP Location: Left Arm)   Pulse 89   Temp 98.1 F (36.7 C) (Oral)   Resp 20   Ht 5\' 9"  (1.753 m)   Wt 143 lb 15.4 oz (65.3 kg)   LMP  (LMP Unknown)   SpO2 99%   BMI 21.26 kg/m   Physical Exam Constitutional:      General: She is not in acute distress.    Appearance: She is ill-appearing.  HENT:     Head: Normocephalic and atraumatic.     Mouth/Throat:     Mouth: Mucous membranes are moist.     Pharynx: Oropharynx is clear.  Eyes:     Extraocular Movements: Extraocular movements intact.     Pupils: Pupils are equal, round, and reactive to light.  Cardiovascular:     Rate and Rhythm: Normal rate and regular rhythm.     Pulses: Normal pulses.     Heart sounds: Normal heart sounds.  Pulmonary:     Effort: Pulmonary effort is normal. No respiratory distress.     Breath sounds: Rhonchi present.     Comments: Rhonchi throughout all fields Abdominal:     General: Bowel sounds are normal. There is distension.     Tenderness: There is abdominal tenderness. There is no guarding.  Musculoskeletal:     Right lower leg: No edema.     Left lower leg: No edema.  Skin:    General: Skin is warm and dry.  Neurological:     Mental Status: She is alert and oriented to person, place, and time.  Psychiatric:        Mood and Affect: Mood normal.        Behavior: Behavior normal.        Thought  Content: Thought content normal.        Judgment: Judgment normal.    Imaging: DG Chest 1 View  Result Date: 08/31/2021 CLINICAL DATA:  GI bleeding. EXAM: CHEST  1 VIEW COMPARISON:  Chest x-ray 08/25/2021. FINDINGS: The heart size and mediastinal contours are within normal limits. Both lungs are clear. Nipple shadow projects over the  right lower lung. The visualized skeletal structures are unremarkable. IMPRESSION: No active disease. Electronically Signed   By: Darliss Cheney M.D.   On: 08/31/2021 18:53   DG Chest 2 View  Result Date: 08/19/2021 CLINICAL DATA:  Shortness of breath. EXAM: CHEST - 2 VIEW COMPARISON:  July 07, 2021 FINDINGS: The heart size and mediastinal contours are within normal limits. Both lungs are clear. The visualized skeletal structures are unremarkable. IMPRESSION: No active cardiopulmonary disease. Electronically Signed   By: Aram Candela M.D.   On: 08/19/2021 21:18   MR ABDOMEN W CONTRAST  Result Date: 09/04/2021 CLINICAL DATA:  Abnormal liver CT, portal vein thrombosis, liver lesions EXAM: MRI ABDOMEN WITH AND WITHOUT CONTRAST TECHNIQUE: Multiplanar multisequence MR imaging of the abdomen was performed both prior to and following after the administration of intravenous contrast. COMPARISON:  CT abdomen pelvis, 08/31/2021 FINDINGS: Lower chest: New small right, moderate left pleural effusions and associated atelectasis or consolidation. Hepatobiliary: Multiple heterogeneous masses throughout the liver with rim and thick internal septal enhancement, largest lesion in the anterior liver, hepatic segment IV B measuring 2.2 x 2.0 cm (series 23, image 39). There is again seen a large burden of expansile thrombus within the left and right portal veins as well as the central portal vein (series 23, image 43). Status post cholecystectomy. Postoperative biliary ductal dilatation Pancreas: No mass, inflammatory changes, or other parenchymal abnormality identified.No pancreatic  ductal dilatation. Spleen:  Within normal limits in size and appearance. Adrenals/Urinary Tract: Normal adrenal glands. No renal masses or suspicious contrast enhancement identified. No evidence of hydronephrosis. Stomach/Bowel: Roux type gastric bypass. Visualized portions within the abdomen are otherwise unremarkable. Vascular/Lymphatic: Enlarged left retroperitoneal nodes measuring up to 1.3 x 1.1 cm (series 15, image 67). Aortic atherosclerosis. No abdominal aortic aneurysm demonstrated. Other: Large volume ascites throughout the abdomen, increased compared to prior examination. Musculoskeletal: No suspicious osseous lesions identified. IMPRESSION: 1. Multiple heterogeneous masses throughout the liver with rim and thick internal septal enhancement. There is additionally a large burden of expansile thrombus in the left and right portal veins as well as the central portal vein. Liver masses appear to be of solid composition and are highly concerning for metastases with tumor thrombus; abscesses remain a general differential consideration. 2. Enlarged left retroperitoneal lymph nodes, suspicious for nodal metastases 3. Large volume ascites throughout the abdomen, increased compared to prior examination. 4. New small right, moderate left pleural effusions and associated atelectasis or consolidation. Electronically Signed   By: Jearld Lesch M.D.   On: 09/04/2021 19:41   DG Foot Complete Left  Result Date: 08/27/2021 CLINICAL DATA:  Left great toe pain. EXAM: LEFT FOOT - COMPLETE 3+ VIEW COMPARISON:  None. FINDINGS: There is no evidence for acute fracture or dislocation. The joint spaces are well maintained. Alignment appears anatomic. Soft tissues are within normal limits. There is a small posterior calcaneal spur. Orthopedic fixation plate is partially visualized in the distal fibula. IMPRESSION: 1. Negative left foot. Electronically Signed   By: Darliss Cheney M.D.   On: 08/27/2021 16:38   DG Foot Complete  Right  Result Date: 08/27/2021 CLINICAL DATA:  46 year old female with left great toe pain. EXAM: RIGHT FOOT COMPLETE - 3+ VIEW COMPARISON:  07/16/2019 FINDINGS: There is no evidence of fracture or dislocation. There is no evidence of arthropathy or other focal bone abnormality. Mild hallux valgus metatarsus prima varus. Soft tissues are unremarkable. IMPRESSION: 1. No acute fracture or malalignment. 2. Mild hallux valgus metatarsus primus varus. Electronically Signed  By: Marliss Coots M.D.   On: 08/27/2021 16:41   VAS [place] ABI WITH/WO TBI  Result Date: 09/06/2021  LOWER EXTREMITY DOPPLER STUDY Patient Name:  ANALUIZA MCNEARY  Date of Exam:   09/06/2021 Medical Rec #: [id number]      Accession #:    [id number] Date of Birth: [birth year]      Patient Gender: F Patient Age:   48 years Exam Location:  [facility] Procedure:      VAS [place] ABI WITH/WO TBI Referring Phys: Cristal Deer DICKSON --------------------------------------------------------------------------------  Indications: Peripheral artery disease. High Risk Factors: Hypertension.  Limitations: Today's exam was limited due to an open wound and involuntary              patient movement. Comparison Study: No prior studies. Performing Technologist: Olen Cordial RVT  Examination Guidelines: A complete evaluation includes at minimum, Doppler waveform signals and systolic blood pressure reading at the level of bilateral brachial, anterior tibial, and posterior tibial arteries, when vessel segments are accessible. Bilateral testing is considered an integral part of a complete examination. Photoelectric Plethysmograph (PPG) waveforms and toe systolic pressure readings are included as required and additional duplex testing as needed. Limited examinations for reoccurring indications may be performed as noted.  ABI Findings: +---------+------------------+-----+---------+--------+ Right    Rt Pressure (mmHg)IndexWaveform Comment   +---------+------------------+-----+---------+--------+ Brachial 133                    triphasic         +---------+------------------+-----+---------+--------+ PTA      165               1.20 triphasic         +---------+------------------+-----+---------+--------+ DP       168               1.23 triphasic         +---------+------------------+-----+---------+--------+ Great Toe89                0.65                   +---------+------------------+-----+---------+--------+ +--------+------------------+-----+---------+-------+ Left    Lt Pressure (mmHg)IndexWaveform Comment +--------+------------------+-----+---------+-------+ [id number]                    triphasic        +--------+------------------+-----+---------+-------+ PTA     166               1.21 triphasic        +--------+------------------+-----+---------+-------+ DP      164               1.20 biphasic         +--------+------------------+-----+---------+-------+ +-------+-----------+-----------+------------+------------+ ABI/TBIToday's ABIToday's TBIPrevious ABIPrevious TBI +-------+-----------+-----------+------------+------------+ Right  1.23       0.65                                +-------+-----------+-----------+------------+------------+ Left   1.21                                           +-------+-----------+-----------+------------+------------+  Summary: Right: Resting right ankle-brachial index is within normal range. No evidence of significant right lower extremity arterial disease. The right toe-brachial index is abnormal. Left: Resting left ankle-brachial index is within normal range. No evidence of significant left  lower extremity arterial disease. Unable to obtain TBI due to great toe ulceration. *See table(s) above for measurements and observations.  Electronically signed by Waverly Ferrari MD on 09/06/2021 at 10:18:39 AM.    Final    CT ANGIO GI BLEED  Result  Date: 08/31/2021 CLINICAL DATA:  GI bleeding EXAM: CTA ABDOMEN AND PELVIS WITHOUT AND WITH CONTRAST TECHNIQUE: Multidetector CT imaging of the abdomen and pelvis was performed using the standard protocol during bolus administration of intravenous contrast. Multiplanar reconstructed images and MIPs were obtained and reviewed to evaluate the vascular anatomy. RADIATION DOSE REDUCTION: This exam was performed according to the departmental dose-optimization program which includes automated exposure control, adjustment of the mA and/or kV according to patient size and/or use of iterative reconstruction technique. CONTRAST:  OMNIPAQUE IOHEXOL 350 MG/ML SOLN COMPARISON:  CTA abdomen pelvis, 08/25/2021 FINDINGS: Examination is severely limited by photopenia/excessive CT dose modulation. VASCULAR Normal contour and caliber of the abdominal aorta. Moderate mixed calcific atherosclerosis. No evidence of aneurysm, dissection, or other acute aortic pathology. Standard branching pattern of the abdominal aorta with solitary bilateral renal arteries. Review of the MIP images confirms the above findings. NON-VASCULAR Lower chest: No acute abnormality. Hepatobiliary: Multiple low-attenuation lesions throughout the liver, as seen on recent prior examination. Largest lesion in the anterior liver, hepatic segment IV B, measuring 1.9 x 1.8 cm (series 13, image 23). Large burden of thrombus within the central portal vein (series 13, image 26) as well as the right portal vein and branches (series 13, image 21). Status post cholecystectomy. Mild postoperative biliary ductal dilatation. Pancreas: Unremarkable. No pancreatic ductal dilatation or surrounding inflammatory changes. Spleen: Normal in size without significant abnormality. Adrenals/Urinary Tract: Adrenal glands are unremarkable. Kidneys are normal, without renal calculi, solid lesion, or hydronephrosis. Bladder is unremarkable. Stomach/Bowel: Status post Roux type gastric  bypass. Appendix appears normal. No evidence of bowel wall thickening, distention, or inflammatory changes. No intraluminal contrast extravasation to localize GI bleeding. Lymphatic: No enlarged abdominal or pelvic lymph nodes. Reproductive: No mass or other significant abnormality. Other: No abdominal wall hernia or abnormality. Moderate volume ascites throughout the abdomen and pelvis. Musculoskeletal: No acute osseous findings. IMPRESSION: 1. Examination is severely limited by photopenia/excessive CT dose modulation. 2. Within this limitation, no evidence of acute aortic pathology. 3. No intraluminal contrast extravasation to localize GI bleeding. 4. Large burden of thrombus within the central portal vein as well as the right portal vein and branches, as seen on recent examination dated 08/25/2021. 5. Multiple low-attenuation lesions throughout the liver, as seen on recent prior examination, highly concerning for hepatic abscesses or metastases. 6. Moderate volume ascites throughout the abdomen and abdomen and pelvis, similar to prior. Electronically Signed   By: Jearld Lesch M.D.   On: 08/31/2021 19:08    Labs:  CBC: Recent Labs    09/01/21 0452 09/01/21 0913 09/02/21 1622 09/05/21 0618 09/06/21 0537  WBC 15.8*  --  15.6* 13.5* 12.1*  HGB 9.6* 9.8* 10.2* 9.3* 9.4*  HCT 27.0* 28.0* 29.8* 27.7* 28.1*  PLT 96*  --  116* 144* 119*    COAGS: Recent Labs    05/22/21 1704 07/03/21 2116 08/31/21 1610 09/01/21 0452 09/06/21 0537  INR 1.1 1.1 1.3* 1.2 1.2  APTT 31 26  --   --   --     BMP: Recent Labs    08/31/21 1610 09/01/21 0452 09/02/21 1622 09/06/21 0537  NA 132* 133* 132* 137  K 4.0 4.0 4.2 3.4*  CL 102 107  109 110  CO2 23 20* 16* 21*  GLUCOSE 71 46* 100* 59*  BUN 72* 65* 40* 22*  CALCIUM 7.5* 7.4* 7.3* 7.5*  CREATININE 0.88 0.60 0.73 0.60  GFRNONAA >60 >60 >60 >60    LIVER FUNCTION TESTS: Recent Labs    08/27/21 1707 08/31/21 1610 09/01/21 0452 09/02/21 1622  09/06/21 0537  BILITOT 0.9 0.6 1.6*  --  0.6  AST 13* 26 24  --  25  ALT 13 16 19   --  28  ALKPHOS 133* 115 110  --  186*  PROT 5.7* 4.6* 4.8*  --  4.8*  ALBUMIN 2.2* 1.8* 1.8* 1.7* 1.6*    TUMOR MARKERS: No results for input(s): AFPTM, CEA, CA199, CHROMGRNA in the last 8760 hours.  Assessment and Plan: History of acute upper GI bleed, hypertension, chronic blood loss anemia, polysubstance abuse and portal vein thrombosis was admitted after presenting to the emergency department 08/31/2021 complaining of dark-colored stools. Patient underwent EGD 09/03/2021 that showed esophageal plaques consistent with candidiasis an large cratered nonbleeding duodenal ulcer with no evidence of active bleeding. MR abdomen 09/04/2021 that showed multiple heterogenous masses throughout the liver, left retroperitoneal lymph nodes suspicious for metastasis and large volume ascites throughout the abdomen.  Patient was referred to IR by Dr. Mauro Kaufmann, MD, for liver lesion biopsy.  Procedure was approved by Jetty Peeks, MD.  MR abdomen 09/04/2021: IMPRESSION: 1. Multiple heterogeneous masses throughout the liver with rim and thick internal septal enhancement. There is additionally a large burden of expansile thrombus in the left and right portal veins as well as the central portal vein. Liver masses appear to be of solid composition and are highly concerning for metastases with tumor thrombus; abscesses remain a general differential consideration. 2. Enlarged left retroperitoneal lymph nodes, suspicious for nodal metastases 3. Large volume ascites throughout the abdomen, increased compared to prior examination. 4. New small right, moderate left pleural effusions and associated atelectasis or consolidation.  Pt resting in bed. She is A&O, calm and pleasant.  She is in no distress. Patient understands she will be n.p.o. at midnight tonight. No blood thinners noted. INR 1.3 on 08/31/2021  Risks and benefits  of liver lesion biopsy with moderate sedation was discussed with the patient and/or patient's family including, but not limited to bleeding, infection, damage to adjacent structures or low yield requiring additional tests.  All of the questions were answered and there is agreement to proceed.  Consent signed and in chart.   Thank you for this interesting consult.  I greatly enjoyed meeting Terri [name] and look forward to participating in their care.  A copy of this report was sent to the requesting provider on this date.  Electronically Signed: Shon Hough, NP 09/06/2021, 10:57 AM   I spent a total of 20 minutes in face to face in clinical consultation, greater than 50% of which was counseling/coordinating care for liver lesion biopsy.

## 2021-09-06 NOTE — Progress Notes (Signed)
[facility] ? ?PROGRESS NOTE ? ?Terri [name] IRJ:[id number] DOB: 1975-06-30 DOA: 08/31/2021 ?PCP: Pcp, No ? ? ?Brief HPI:   ?46 year old female with history of acute upper GI bleed in setting of peptic ulcer disease, acute lower GI bleeding 2021, essential hypertension, chronic blood loss anemia, polysubstance abuse, portal vein thrombosis was admitted to [facility] for acute GI bleed after presenting from home complaining of dark-colored stool.  Gastroenterology was consulted, EGD on 09/03/2021 showed esophageal plaques consistent with candidiasis, large cratered nonbleeding duodenal ulcer with no stigmata of bleeding.  CT angiogram on 08/31/2021 showed multiple low-attenuation lesions throughout the liver seen on recent prior exam, highly concerning for hepatic abscesses or metastasis. ? ? ? ?Subjective  ? ?Patient seen and examined, seen by vascular surgery yesterday. ? ? Assessment/Plan:  ? ?Upper GI bleed ?-Patient had 2 episodes of melena at home ?-Gastroenterology consulted ?-Underwent EGD on 09/03/2021 ?-EGD showed esophageal plaques consistent with candidiasis, large cratered nonbleeding duodenal ulcer with no stigmata of bleeding ?-Today hemoglobin is stable at 10.2 ?-Continue PPI, Carafate, fluconazole ?-Patient is on dysphagia 3 diet ? ?Liver lesions ?-CTA abdomen/pelvis showed multiple low-attenuation lesions throughout the liver, highly concerning for hepatic abscesses or metastasis ?-MRI liver shows hepatic metastasis. ?-IR has been consulted for liver biopsy  ?-CA 19-9, CEA, AFP are all unremarkable ? ?Portal vein thrombosis ?-Seen on CTA abdomen/pelvis ?-Not a candidate for anticoagulation ? 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 ?Hypertension ?-Blood pressure is stable ?-Continue amlodipine, lisinopril, hydralazine ? ?History of polysubstance abuse ?-TOC consulted for outpatient substance abuse rehab ?-UDS positive for cocaine ? ?Hypokalemia ?-We will replace potassium and follow BMP in am ? ?Medications ? ?  ? amLODipine  10 mg Oral Daily  ? Chlorhexidine Gluconate Cloth  6 each Topical Daily  ? fluconazole  100 mg Oral Daily  ? guaiFENesin  600 mg Oral BID  ? hydrALAZINE  50 mg Oral BID  ? lidocaine  1 patch Transdermal Daily  ? lisinopril  10 mg Oral QHS  ? nicotine  21 mg Transdermal Daily  ? pantoprazole (PROTONIX) IV  40 mg Intravenous Q12H  ? sucralfate  1 g Oral TID WC & HS  ? ? ? Data Reviewed:  ? ?CBG: ? ?Recent Labs  ?Lab 09/01/21 ?3016 09/01/21 ?0756 09/02/21 ?0742  ?GLUCAP 47* 100* 58*  ? ? ?SpO2: 99 % ?O2 Flow Rate (L/min): 1 L/min  ? ? ?Vitals:  ? 09/05/21 1512 09/05/21 2055 09/06/21 0351 09/06/21 0359  ?BP: 116/81 138/84 (!) 130/94   ?Pulse: 89 89 89   ?Resp: '18 20 20   '$ ?Temp: 97.9 ?F (36.6 ?C) 97.7 ?F (36.5 ?C) 98.1 ?F (36.7 ?C)   ?TempSrc: Oral Oral Oral   ?SpO2: 97% 94% 99%   ?Weight:    65.3 kg  ?Height:      ? ? ? ? ?Data Reviewed: ? ?Basic Metabolic Panel: ?Recent Labs  ?Lab 08/31/21 ?1610 09/01/21 ?0452 09/02/21 ?1622 09/06/21 ?0109  ?NA 132* 133* 132* 137  ?K 4.0 4.0 4.2 3.4*  ?CL 102 107 109 110  ?CO2 23 20* 16* 21*  ?GLUCOSE 71 46* 100* 59*  ?BUN 72* 65* 40* 22*  ?CREATININE 0.88 0.60 0.73 0.60  ?CALCIUM 7.5* 7.4* 7.3* 7.5*  ?MG 2.0 2.0  --   --   ?  PHOS  --   --  3.0  --   ? ? ?CBC: ?Recent Labs  ?Lab 08/31/21 ?1610 09/01/21 ?0452 09/01/21 ?1610 09/02/21 ?1622 09/05/21 ?0618 09/06/21 ?9604  ?WBC 12.0* 15.8*  --  15.6* 13.5* 12.1*  ?NEUTROABS 10.4* 14.4*  --   --   --  10.1*  ?HGB 4.6* 9.6* 9.8* 10.2* 9.3* 9.4*  ?HCT 13.3* 27.0* 28.0* 29.8* 27.7* 28.1*  ?MCV 63.3* 72.0*  --  73.6* 75.3* 75.7*  ?PLT 122* 96*  --  116* 144* 119*  ? ? ?LFT ?Recent Labs  ?Lab 08/31/21 ?1610 09/01/21 ?0452 09/02/21 ?1622  09/06/21 ?5409  ?AST 26 24  --  25  ?ALT 16 19  --  28  ?ALKPHOS 115 110  --  186*  ?BILITOT 0.6 1.6*  --  0.6  ?PROT 4.6* 4.8*  --  4.8*  ?ALBUMIN 1.8* 1.8* 1.7* 1.6*  ? ?  ?Antibiotics: ?Anti-infectives (From admission, onward)  ? ? Start     Dose/Rate Route Frequency Ordered Stop  ? 09/04/21 1000  fluconazole (DIFLUCAN) tablet 100 mg       ? 100 mg Oral Daily 09/03/21 1334 09/13/21 0959  ? 09/03/21 1500  fluconazole (DIFLUCAN) IVPB 200 mg       ? 200 mg ?100 mL/hr over 60 Minutes Intravenous  Once 09/03/21 1334 09/03/21 1637  ? 08/31/21 2030  cefTRIAXone (ROCEPHIN) 1 g in sodium chloride 0.9 % 100 mL IVPB       ? 1 g ?200 mL/hr over 30 Minutes Intravenous  Once 08/31/21 2026 08/31/21 2242  ? ?  ? ? ? ?DVT prophylaxis: SCDs ? ?Code Status: Full code ? ?Family Communication: No family at bedside ? ? ?CONSULTS gastroenterology ? ? ?Objective  ? ? ?Physical Examination: ? ? ?General-appears in no acute distress ?Heart-S1-S2, regular, no murmur auscultated ?Lungs-clear to auscultation bilaterally, no wheezing or crackles auscultated ?Abdomen-soft, nontender, no organomegaly ?Extremities-no edema in the lower extremities ?Neuro-alert, oriented x3, no focal deficit noted ? ? ?Status is: Inpatient: GI bleed ? ? ? ?  ? ? ? ? ? ?Terri [name] ?  ?[facility] ?If 7PM-7AM, please contact night-coverage at [email or web address], ?Office  [phone] ? ? ?09/06/2021, 2:00 PM  LOS: 5 days  ? ? ? ? ? ? ? ? ? ? ?  ?

## 2021-09-06 NOTE — Progress Notes (Signed)
Pt has moved to YELLOW MEWS. ?Alert and oriented. ?Awaiting bed on Progressive unit. ?Writer has spoken with pt's daughter, per pt request. ?

## 2021-09-06 NOTE — Progress Notes (Signed)
Writer called to patient's room, as pt is stating "I can't breathe". ?VS gotten. R40 and HR 104. This puts pt in RED MEWS. ?MD and RR called and both parties have come to bedside. ?MS IV given as ordered. Continuous pulse ox. ?Orders received to move patient to Progressive unit. ?Close monitoring continues. ?Pt lying in bed, alert and oriented. [place] and wheezing. ?RT called and pt has received breathing tx.  ?  ?

## 2021-09-06 NOTE — Progress Notes (Signed)
Pt has been moved to Room 1425. ?

## 2021-09-07 ENCOUNTER — Inpatient Hospital Stay (HOSPITAL_COMMUNITY): Payer: Medicaid Other

## 2021-09-07 ENCOUNTER — Other Ambulatory Visit (HOSPITAL_COMMUNITY): Payer: Medicaid Other

## 2021-09-07 DIAGNOSIS — K922 Gastrointestinal hemorrhage, unspecified: Secondary | ICD-10-CM | POA: Diagnosis not present

## 2021-09-07 DIAGNOSIS — K769 Liver disease, unspecified: Secondary | ICD-10-CM | POA: Diagnosis not present

## 2021-09-07 DIAGNOSIS — F1910 Other psychoactive substance abuse, uncomplicated: Secondary | ICD-10-CM | POA: Diagnosis not present

## 2021-09-07 MED ORDER — MIDAZOLAM HCL 2 MG/2ML IJ SOLN
INTRAMUSCULAR | Status: AC
Start: 2021-09-07 — End: 2021-09-08
  Filled 2021-09-07: qty 2

## 2021-09-07 MED ORDER — LIDOCAINE HCL 1 % IJ SOLN
INTRAMUSCULAR | Status: AC
  Administered 2021-09-07: 10 mL
  Filled 2021-09-07: qty 20

## 2021-09-07 MED ORDER — GELATIN ABSORBABLE 12-7 MM EX MISC
CUTANEOUS | Status: AC
  Filled 2021-09-07: qty 1

## 2021-09-07 MED ORDER — DIPHENHYDRAMINE HCL 25 MG PO CAPS
25.0000 mg | ORAL_CAPSULE | Freq: Once | ORAL | Status: AC
  Administered 2021-09-07: 25 mg via ORAL
  Filled 2021-09-07: qty 1

## 2021-09-07 MED ORDER — HYDROMORPHONE HCL 1 MG/ML IJ SOLN
1.0000 mg | Freq: Once | INTRAMUSCULAR | Status: AC
  Administered 2021-09-07: 1 mg via INTRAVENOUS
  Filled 2021-09-07: qty 1

## 2021-09-07 MED ORDER — FENTANYL CITRATE (PF) 100 MCG/2ML IJ SOLN
INTRAMUSCULAR | Status: AC
Start: 2021-09-07 — End: 2021-09-08
  Filled 2021-09-07: qty 2

## 2021-09-07 NOTE — Progress Notes (Signed)
Pt refused AM labs

## 2021-09-07 NOTE — Progress Notes (Signed)
[facility] ? ?PROGRESS NOTE ? ?Terri [name] BZJ:[id number] DOB: 17-Mar-1976 DOA: 08/31/2021 ?PCP: Pcp, No ? ? ?Brief HPI:   ?46 year old female with history of acute upper GI bleed in setting of peptic ulcer disease, acute lower GI bleeding 2021, essential hypertension, chronic blood loss anemia, polysubstance abuse, portal vein thrombosis was admitted to [facility] for acute GI bleed after presenting from home complaining of dark-colored stool.  Gastroenterology was consulted, EGD on 09/03/2021 showed esophageal plaques consistent with candidiasis, large cratered nonbleeding duodenal ulcer with no stigmata of bleeding.  CT angiogram on 08/31/2021 showed multiple low-attenuation lesions throughout the liver seen on recent prior exam, highly concerning for hepatic abscesses or metastasis. ? ? ? ?Subjective  ? ?Patient seen and examined, plan for liver biopsy today. ? ? Assessment/Plan:  ? ?Upper GI bleed ?-Patient had 2 episodes of melena at home ?-Gastroenterology consulted ?-Underwent EGD on 09/03/2021 ?-EGD showed esophageal plaques consistent with candidiasis, large cratered nonbleeding duodenal ulcer with no stigmata of bleeding ?-Today hemoglobin is stable at 10.2 ?-Continue PPI, Carafate, fluconazole ?-Patient is on dysphagia 3 diet ? ?Liver lesions ?-CTA abdomen/pelvis showed multiple low-attenuation lesions throughout the liver, highly concerning for hepatic abscesses or metastasis ?-MRI liver shows hepatic metastasis. ?-IR has been consulted for liver biopsy  ?-Plan for biopsy today. ?-CA 19-9, CEA, AFP are all unremarkable ? ?Portal vein thrombosis ?-Seen on CTA abdomen/pelvis ?-Not a candidate for anticoagulation ? 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 ?Hypertension ?-Blood pressure is stable ?-Continue amlodipine, lisinopril, hydralazine ? ?History of polysubstance abuse ?-TOC consulted for outpatient substance abuse rehab ?-UDS positive for cocaine ? ?Hypokalemia ?-Potassium has been replaced, follow labs in a.m. ? ?Medications ? ?  ? amLODipine  10 mg Oral Daily  ? Chlorhexidine Gluconate Cloth  6 each Topical Daily  ? fluconazole  100 mg Oral Daily  ? guaiFENesin  600 mg Oral BID  ? hydrALAZINE  50 mg Oral BID  ? lidocaine  1 patch Transdermal Daily  ? lidocaine      ? lisinopril  10 mg Oral QHS  ? nicotine  21 mg Transdermal Daily  ? pantoprazole (PROTONIX) IV  40 mg Intravenous Q12H  ? potassium chloride  40 mEq Oral Once  ? sucralfate  1 g Oral TID WC & HS  ? ? ? Data Reviewed:  ? ?CBG: ? ?Recent Labs  ?Lab 09/01/21 ?0175 09/01/21 ?0756 09/02/21 ?0742  ?GLUCAP 47* 100* 58*  ? ? ?SpO2: 97 % ?O2 Flow Rate (L/min): 3 L/min  ? ? ?Vitals:  ? 09/06/21 1840 09/06/21 1919 09/07/21 0349 09/07/21 0440  ?BP: (!) 141/87 116/77 123/79   ?Pulse: 100 (!) 109 84   ?Resp: (!) 26 (!) 28 (!) 22   ?Temp: 97.9 ?F (36.6 ?C) 98.1 ?F (36.7 ?C)    ?TempSrc: Oral Oral    ?SpO2: 99% 97% 97%   ?Weight:    67 kg  ?Height:      ? ? ? ? ?Data Reviewed: ? ?Basic Metabolic Panel: ?Recent Labs  ?Lab 08/31/21 ?1610 09/01/21 ?0452 09/02/21 ?1622 09/06/21 ?1025  ?NA 132* 133* 132* 137  ?K 4.0 4.0 4.2 3.4*  ?CL 102 107 109 110  ?CO2 23 20* 16* 21*  ?GLUCOSE 71 46* 100* 59*  ?BUN  72* 65* 40* 22*  ?CREATININE 0.88 0.60 0.73 0.60  ?CALCIUM 7.5* 7.4* 7.3* 7.5*  ?MG 2.0 2.0  --   --   ?PHOS  --   --  3.0  --   ? ? ?CBC: ?Recent Labs  ?Lab 08/31/21 ?1610 09/01/21 ?0452 09/01/21 ?8280 09/02/21 ?1622 09/05/21 ?0618 09/06/21 ?0349  ?WBC 12.0* 15.8*  --  15.6* 13.5* 12.1*  ?NEUTROABS 10.4* 14.4*  --   --   --  10.1*  ?HGB 4.6* 9.6* 9.8* 10.2* 9.3* 9.4*  ?HCT 13.3* 27.0* 28.0* 29.8* 27.7* 28.1*  ?MCV 63.3* 72.0*  --  73.6* 75.3* 75.7*  ?PLT 122* 96*  --  116* 144*  119*  ? ? ?LFT ?Recent Labs  ?Lab 08/31/21 ?1610 09/01/21 ?0452 09/02/21 ?1622 09/06/21 ?1791  ?AST 26 24  --  25  ?ALT 16 19  --  28  ?ALKPHOS 115 110  --  186*  ?BILITOT 0.6 1.6*  --  0.6  ?PROT 4.6* 4.8*  --  4.8*  ?ALBUMIN 1.8* 1.8* 1.7* 1.6*  ? ?  ?Antibiotics: ?Anti-infectives (From admission, onward)  ? ? Start     Dose/Rate Route Frequency Ordered Stop  ? 09/04/21 1000  fluconazole (DIFLUCAN) tablet 100 mg       ? 100 mg Oral Daily 09/03/21 1334 09/13/21 0959  ? 09/03/21 1500  fluconazole (DIFLUCAN) IVPB 200 mg       ? 200 mg ?100 mL/hr over 60 Minutes Intravenous  Once 09/03/21 1334 09/03/21 1637  ? 08/31/21 2030  cefTRIAXone (ROCEPHIN) 1 g in sodium chloride 0.9 % 100 mL IVPB       ? 1 g ?200 mL/hr over 30 Minutes Intravenous  Once 08/31/21 2026 08/31/21 2242  ? ?  ? ? ? ?DVT prophylaxis: SCDs ? ?Code Status: Full code ? ?Family Communication: No family at bedside ? ? ?CONSULTS gastroenterology ? ? ?Objective  ? ? ?Physical Examination: ? ? ?General-appears in no acute distress ?Heart-S1-S2, regular, no murmur auscultated ?Lungs-clear to auscultation bilaterally, no wheezing or crackles auscultated ?Abdomen-soft, nontender, no organomegaly ?Extremities-no edema in the lower extremities ?Neuro-alert, oriented x3, no focal deficit noted ? ?Status is: Inpatient: GI bleed ? ? ? ?  ? ? ? ? ? ?[name] ?  ?[facility] ?If 7PM-7AM, please contact night-coverage at [email or web address], ?Office  [phone] ? ? ?09/07/2021, 2:35 PM  LOS: 6 days  ? ? ? ? ? ? ? ? ? ? ?  ?

## 2021-09-07 NOTE — Procedures (Signed)
?  Procedure:  [place] core liver lesion biopsy, and aspiration for culture   ?Preprocedure diagnosis: The primary encounter diagnosis was Acute GI bleeding. Diagnoses of Anemia, unspecified type and Melena were also pertinent to this visit. ? ?Postprocedure diagnosis: same ?EBL:    minimal ?Complications:   none immediate ? ?See full dictation in [facility]. ? ?D. Arne Cleveland MD ?Main # [phone] ?Pager  [phone] ?Mobile [phone] ?  ? ?

## 2021-09-07 NOTE — Progress Notes (Signed)
[facility] Progress Note ? ?Terri [name] 46 y.o. Mar 01, 1976 ? ?CC:  Upper GI bleed, ascites, liver mets ? ? ?Subjective: ?Patient reports being incredibly frustrated with office staff. Denies any new complaints today. Adamantly refused physical exam. Would like to know when her paracentesis is. ? ?ROS : Review of Systems  ?Gastrointestinal:  Negative for abdominal pain, blood in stool, constipation, diarrhea, heartburn, melena, nausea and vomiting.  ?Genitourinary:  Negative for dysuria and urgency.   ? ? ?Objective: ?Vital signs in last 24 hours: ?Vitals:  ? 09/06/21 1919 09/07/21 0349  ?BP: 116/77 123/79  ?Pulse: (!) 109 84  ?Resp: (!) 28 (!) 22  ?Temp: 98.1 ?F (36.7 ?C)   ?SpO2: 97% 97%  ? ? ?Physical Exam: ? ?General:  Alert, cooperative, no distress, appears stated age  ?Head:  Normocephalic, without obvious abnormality, atraumatic  ?Eyes:  Anicteric sclera, EOM's intact  ?Lungs:   Adamantly declined provider doing physical exam  ?Heart:    ?Abdomen:     ? ? ?Lab Results: ?Recent Labs  ?  09/06/21 ?4782  ?NA 137  ?K 3.4*  ?CL 110  ?CO2 21*  ?GLUCOSE 59*  ?BUN 22*  ?CREATININE 0.60  ?CALCIUM 7.5*  ? ?Recent Labs  ?  09/06/21 ?9562  ?AST 25  ?ALT 28  ?ALKPHOS 186*  ?BILITOT 0.6  ?PROT 4.8*  ?ALBUMIN 1.6*  ? ?Recent Labs  ?  09/05/21 ?0618 09/06/21 ?1308  ?WBC 13.5* 12.1*  ?NEUTROABS  --  10.1*  ?HGB 9.3* 9.4*  ?HCT 27.7* 28.1*  ?MCV 75.3* 75.7*  ?PLT 144* 119*  ? ?Recent Labs  ?  09/06/21 ?6578  ?LABPROT 14.9  ?INR 1.2  ? ? ? ? ?Assessment ?Upper GI bleed, melena ?- Hgb 4.6 on presentation to ED, improved to 9.6 after 3 units PRBCs ?- Hgb 9.4, stable ?- BUN 22 (improved), Cr. 0.60 ?- CT angio 08/31/21 - Exam severely limited by photopenia; however, no intraluminal contrast extravasation to localize GI bleeding.  Large burden of thrombus within the central portal vein as well as the right portal vein and branches as seen on recent exam dated 08/25/2021.  Multiple low-attenuation lesions throughout the  liver, seen on recent prior exam, highly concerning for hepatic abscesses or metastases.  Moderate volume ascites throughout the abdomen and pelvis similar to prior. ?- EGD 09/03/2021 shows esophageal plaques consistent with candidiasis.  Large cratered nonbleeding jejunal ulcer with no stigmata of bleeding.  ? ?Liver mets ?- AFP, CA 19-9 normal ?- CEA 5.2 ?- Hepatitis panel negative ? ?Plan: ?Patient currently planned for paracentesis and liver biopsy today.  Tumor markers were unrevealing.  Will await results of paracentesis/liver biopsy. ?Hemoglobin stable.  No further signs of bleeding ?Continue daily CBC and transfuse as needed to maintain HGB > 7  ?[facility] will follow ? ?Delio Slates Radford Pax PA-C ?09/07/2021, 10:16 AM ? ?Contact #  [phone]  ?

## 2021-09-08 DIAGNOSIS — K769 Liver disease, unspecified: Secondary | ICD-10-CM | POA: Diagnosis not present

## 2021-09-08 DIAGNOSIS — K922 Gastrointestinal hemorrhage, unspecified: Secondary | ICD-10-CM | POA: Diagnosis not present

## 2021-09-08 DIAGNOSIS — I10 Essential (primary) hypertension: Secondary | ICD-10-CM | POA: Diagnosis not present

## 2021-09-08 DIAGNOSIS — I81 Portal vein thrombosis: Secondary | ICD-10-CM | POA: Diagnosis not present

## 2021-09-08 DIAGNOSIS — K750 Abscess of liver: Secondary | ICD-10-CM | POA: Diagnosis not present

## 2021-09-08 DIAGNOSIS — M549 Dorsalgia, unspecified: Secondary | ICD-10-CM | POA: Diagnosis not present

## 2021-09-08 DIAGNOSIS — M7989 Other specified soft tissue disorders: Secondary | ICD-10-CM

## 2021-09-08 DIAGNOSIS — I96 Gangrene, not elsewhere classified: Secondary | ICD-10-CM

## 2021-09-08 DIAGNOSIS — R2232 Localized swelling, mass and lump, left upper limb: Secondary | ICD-10-CM

## 2021-09-08 DIAGNOSIS — A4901 Methicillin susceptible Staphylococcus aureus infection, unspecified site: Secondary | ICD-10-CM | POA: Insufficient documentation

## 2021-09-08 DIAGNOSIS — F1910 Other psychoactive substance abuse, uncomplicated: Secondary | ICD-10-CM | POA: Diagnosis not present

## 2021-09-08 DIAGNOSIS — B3781 Candidal esophagitis: Secondary | ICD-10-CM | POA: Diagnosis not present

## 2021-09-08 MED ORDER — ALPRAZOLAM 0.5 MG PO TABS
0.5000 mg | ORAL_TABLET | Freq: Three times a day (TID) | ORAL | Status: DC | PRN
  Administered 2021-09-08 – 2021-09-19 (×21): 0.5 mg via ORAL
  Filled 2021-09-08 (×22): qty 1

## 2021-09-08 MED ORDER — FLUCONAZOLE 100 MG PO TABS
200.0000 mg | ORAL_TABLET | Freq: Every day | ORAL | Status: AC
  Administered 2021-09-09 – 2021-09-20 (×12): 200 mg via ORAL
  Filled 2021-09-08 (×12): qty 2

## 2021-09-08 MED ORDER — HYDROMORPHONE HCL 1 MG/ML IJ SOLN
0.5000 mg | Freq: Once | INTRAMUSCULAR | Status: AC
  Administered 2021-09-08: 0.5 mg via INTRAVENOUS
  Filled 2021-09-08: qty 0.5

## 2021-09-08 MED ORDER — SODIUM CHLORIDE 0.9 % IV SOLN
1.0000 g | INTRAVENOUS | Status: DC
  Administered 2021-09-08: 1 g via INTRAVENOUS
  Filled 2021-09-08 (×2): qty 10

## 2021-09-08 MED ORDER — VANCOMYCIN HCL 750 MG/150ML IV SOLN
750.0000 mg | Freq: Two times a day (BID) | INTRAVENOUS | Status: DC
  Administered 2021-09-08: 750 mg via INTRAVENOUS
  Filled 2021-09-08 (×2): qty 150

## 2021-09-08 MED ORDER — HYDROMORPHONE HCL 1 MG/ML IJ SOLN
1.0000 mg | INTRAMUSCULAR | Status: DC | PRN
  Administered 2021-09-08 – 2021-09-16 (×47): 1 mg via INTRAVENOUS
  Filled 2021-09-08 (×49): qty 1

## 2021-09-08 MED ORDER — VANCOMYCIN HCL 1250 MG/250ML IV SOLN
1250.0000 mg | Freq: Once | INTRAVENOUS | Status: AC
  Administered 2021-09-08: 1250 mg via INTRAVENOUS
  Filled 2021-09-08: qty 250

## 2021-09-08 NOTE — Progress Notes (Signed)
[facility] Progress Note ? ?Terri [name] 46 y.o. 01/09/1976 ? ?CC:  Upper GI bleed, ascites, liver mets ?  ? ? ?Subjective: ?And examined resting in bed.  She notes she has continued abdominal pain.  Would like to change her pain medication.  Denies any new complaints today. ? ?ROS : Review of Systems  ?Gastrointestinal:  Positive for abdominal pain. Negative for blood in stool, constipation, diarrhea, heartburn, melena, nausea and vomiting.   ? ? ?Objective: ?Vital signs in last 24 hours: ?Vitals:  ? 09/08/21 0500 09/08/21 0846  ?BP: (!) 125/91   ?Pulse: 100   ?Resp: 19   ?Temp: 98.2 ?F (36.8 ?C)   ?SpO2: 92% 94%  ? ? ?Physical Exam: ? ?General:  Alert, cooperative, no distress, appears older than stated age, cachectic  ?Head:  Normocephalic, without obvious abnormality, atraumatic  ?Eyes:  Anicteric sclera, EOM's intact, conjunctival pallor  ?Lungs:   Clear to auscultation bilaterally, respirations unlabored  ?Heart:  tachycardic and normal rhythm, S1, S2 normal  ?Abdomen:   Soft, non-tender, bowel sounds active all four quadrants,  no masses, generalized tenderness to palpation  ?Extremities: Extremities normal, atraumatic,pitting edema b/l  ?Pulses: 2+ and symmetric  ? ? ?Lab Results: ?Recent Labs  ?  09/06/21 ?5852  ?NA 137  ?K 3.4*  ?CL 110  ?CO2 21*  ?GLUCOSE 59*  ?BUN 22*  ?CREATININE 0.60  ?CALCIUM 7.5*  ? ?Recent Labs  ?  09/06/21 ?7782  ?AST 25  ?ALT 28  ?ALKPHOS 186*  ?BILITOT 0.6  ?PROT 4.8*  ?ALBUMIN 1.6*  ? ?Recent Labs  ?  09/06/21 ?4235  ?WBC 12.1*  ?NEUTROABS 10.1*  ?HGB 9.4*  ?HCT 28.1*  ?MCV 75.7*  ?PLT 119*  ? ?Recent Labs  ?  09/06/21 ?3614  ?LABPROT 14.9  ?INR 1.2  ? ? ? ? ?Assessment ?Upper GI bleed, melena ?- Hgb 4.6 on presentation to ED, improved to 9.6 after 3 units PRBCs ?- Hgb 9.4, stable ?- BUN 22 (improved), Cr. 0.60 ?- CT angio 08/31/21 - Exam severely limited by photopenia; however, no intraluminal contrast extravasation to localize GI bleeding.  Large burden of thrombus  within the central portal vein as well as the right portal vein and branches as seen on recent exam dated 08/25/2021.  Multiple low-attenuation lesions throughout the liver, seen on recent prior exam, highly concerning for hepatic abscesses or metastases.  Moderate volume ascites throughout the abdomen and pelvis similar to prior. ?- EGD 09/03/2021 shows esophageal plaques consistent with candidiasis.  Large cratered nonbleeding jejunal ulcer with no stigmata of bleeding.  ?  ?Liver mets ?- AFP, CA 19-9 normal ?- CEA 5.2 ?- Hepatitis panel negative ? ?Liver biopsy on 4/17: awaiting biopsy results.  ? ?Plan: ?- Continue Protonix 40 mg every 12 hours along with sucralfate 1 g 4 times a day. ?- Continue fluconazole 100 mg daily for suspected candidiasis noted on EGD. ?- Hemoglobin stable.  No further signs of bleeding ?- Continue daily CBC and transfuse as needed to maintain HGB > 7  ?- [facility] will follow ? ?Charlott Rakes PA-C ?09/08/2021, 9:07 AM ? ?Contact #  [phone] ? ?

## 2021-09-08 NOTE — Progress Notes (Signed)
Pharmacy Antibiotic Note ? ?Pt is a 46 y.o. female with hx of peptic ulcer disease, acute lower GI bleeding in 2021, HTN, chronic blood loss anemia, polysubstance abuse, and DVT who presented to the ED with concerns of GI bleeding and admitted on 08/31/2021. Abdominal MRI on 4/14 showed multiple liver masses with concerns for abscesses or tumor. It also showed a large burden of expansile thrombus in the left and right portal veins. She underwent liver lesion biopsy and aspiration on 4/17 with abscess culture came back positive with Staph aureus. Pharmacy has been consulted for Vancomycin dosing for infection. ? ?Today, 4/18, day#1 of Vanc ?-WBC 12.1 elevated  on 4/16 ?-Temp 98.2 normal ?-SrCr 0.6 on 4/16 ? ?Plan: ?-Vancomycin '1250mg'$  for 1 followed by '750mg'$  q12h (estimated AUC 448, peak 28, trough 12) ?-Monitor SrCr ? ?Height: '5\' 9"'$  (175.3 cm) ?Weight: 62 kg (136 lb 11 oz) ?IBW/kg (Calculated) : 66.2 ? ?Temp (24hrs), Avg:97.9 ?F (36.6 ?C), Min:97.4 ?F (36.3 ?C), Max:98.5 ?F (36.9 ?C) ? ?Recent Labs  ?Lab 09/02/21 ?1622 09/05/21 ?0618 09/06/21 ?8325  ?WBC 15.6* 13.5* 12.1*  ?CREATININE 0.73  --  0.60  ?  ?Estimated Creatinine Clearance: 85.1 mL/min (by C-G formula based on SCr of 0.6 mg/dL).   ? ?Allergies  ?Allergen Reactions  ? Nsaids Other (See Comments)  ?  Stomach ulcer ?  ? Ketorolac Tromethamine Itching and Nausea And Vomiting  ? ? ?Antimicrobials this admission: ?4/18 Vancomycin >> ?4/13 Fluconazole >> ? ?Microbiology results: ?4/18 BCx: pending ? ? ?Thank you for allowing pharmacy to be a part of this patient?s care. ? ?Thea Alken, PharmD Candidate ?09/08/2021 9:52 AM ? ?

## 2021-09-08 NOTE — Progress Notes (Signed)
[facility] ? ?PROGRESS NOTE ? ?Terri [name]:[id number] DOB: 08/21/1975 DOA: 08/31/2021 ?PCP: Pcp, No ? ? ?Brief HPI:   ?46 year old female with history of acute upper GI bleed in setting of peptic ulcer disease, acute lower GI bleeding 2021, essential hypertension, chronic blood loss anemia, polysubstance abuse, portal vein thrombosis was admitted to [facility] for acute GI bleed after presenting from home complaining of dark-colored stool.  Gastroenterology was consulted, EGD on 09/03/2021 showed esophageal plaques consistent with candidiasis, large cratered nonbleeding duodenal ulcer with no stigmata of bleeding.  CT angiogram on 08/31/2021 showed multiple low-attenuation lesions throughout the liver seen on recent prior exam, highly concerning for hepatic abscesses or metastasis. ? ? ? ?Subjective  ? ?Patient seen and examined, complains of abdominal pain.  Says p.o. pain medication not enough for pain.  Underwent liver biopsy yesterday.  Biopsy showed abscess so culture was sent.  Growing Staph aureus. ? ? Assessment/Plan:  ? ?Upper GI bleed ?-Patient had 2 episodes of melena at home ?-Gastroenterology consulted ?-Underwent EGD on 09/03/2021 ?-EGD showed esophageal plaques consistent with candidiasis, large cratered nonbleeding duodenal ulcer with no stigmata of bleeding ?-Today hemoglobin is stable at 10.2 ?-Continue PPI, Carafate, fluconazole ?-Patient is on dysphagia 3 diet ? ?Liver  abscess ?-CTA abdomen/pelvis showed multiple low-attenuation lesions throughout the liver, highly concerning for hepatic abscesses or metastasis ?-MRI liver shows hepatic metastasis. ?-IR has been consulted for liver biopsy  ?-Underwent liver biopsy which showed abscess. ?-Liver abscess growing Staph aureus ?-We will start vancomycin per pharmacy consult ?-We will consult ID ?-We will discontinue oxycodone and start Dilaudid 1 mg IV every 4 hours as needed ?-CA 19-9, CEA, AFP are all unremarkable ? ?Portal vein  thrombosis ?-Seen on CTA abdomen/pelvis ?-Not a candidate for anticoagulation ? 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 ?Hypertension ?-Blood pressure is stable ?-Continue amlodipine, lisinopril, hydralazine ? ?History of polysubstance abuse ?-TOC consulted for outpatient substance abuse rehab ?-UDS positive for cocaine ? ?Hypokalemia ?-Potassium has been replaced, follow labs in a.m. ? ?Medications ? ?  ? amLODipine  10 mg Oral Daily  ? Chlorhexidine Gluconate Cloth  6 each Topical Daily  ? fluconazole  100 mg Oral Daily  ? guaiFENesin  600 mg Oral BID  ? hydrALAZINE  50 mg Oral BID  ? lidocaine  1 patch Transdermal Daily  ? lisinopril  10 mg Oral QHS  ? nicotine  21 mg Transdermal Daily  ? pantoprazole (PROTONIX) IV  40 mg Intravenous Q12H  ? potassium chloride  40 mEq Oral Once  ? sucralfate  1 g Oral TID WC & HS  ? ? ? Data Reviewed:  ? ?CBG: ? ?Recent Labs  ?Lab 09/02/21 ?2774  ?GLUCAP 58*  ? ? ?SpO2: 94 % ?O2 Flow Rate (L/min): 4 L/min  ? ? ?Vitals:  ? 09/08/21 0300 09/08/21 0400 09/08/21 0500 09/08/21 0846  ?BP: (!) 131/91 (!) 125/95 (!) 125/91   ?Pulse:   100   ?Resp: (!) '23 18 19   '$ ?Temp:   98.2 ?F (36.8 ?C)   ?TempSrc:   Oral   ?SpO2: 90% 92% 92% 94%  ?Weight:   62 kg   ?Height:      ? ? ? ? ?Data Reviewed: ? ?Basic Metabolic Panel: ?Recent Labs  ?  Lab 09/02/21 ?1622 09/06/21 ?4627  ?NA 132* 137  ?K 4.2 3.4*  ?CL 109 110  ?CO2 16* 21*  ?GLUCOSE 100* 59*  ?BUN 40* 22*  ?CREATININE 0.73 0.60  ?CALCIUM 7.3* 7.5*  ?PHOS 3.0  --   ? ? ?CBC: ?Recent Labs  ?Lab 09/02/21 ?1622 09/05/21 ?0618 09/06/21 ?0350  ?WBC 15.6* 13.5* 12.1*  ?NEUTROABS  --   --  10.1*  ?HGB 10.2* 9.3* 9.4*  ?HCT 29.8* 27.7* 28.1*  ?MCV 73.6* 75.3* 75.7*  ?PLT 116*  144* 119*  ? ? ?LFT ?Recent Labs  ?Lab 09/02/21 ?1622 09/06/21 ?0938  ?AST  --  25  ?ALT  --  28  ?ALKPHOS  --  186*  ?BILITOT  --  0.6  ?PROT  --  4.8*  ?ALBUMIN 1.7* 1.6*  ? ?  ?Antibiotics: ?Anti-infectives (From admission, onward)  ? ? Start     Dose/Rate Route Frequency Ordered Stop  ? 09/08/21 2300  vancomycin (VANCOREADY) IVPB 750 mg/150 mL       ? 750 mg ?150 mL/hr over 60 Minutes Intravenous Every 12 hours 09/08/21 1008    ? 09/08/21 1100  vancomycin (VANCOREADY) IVPB 1250 mg/250 mL       ? 1,250 mg ?166.7 mL/hr over 90 Minutes Intravenous  Once 09/08/21 1005 09/08/21 1253  ? 09/04/21 1000  fluconazole (DIFLUCAN) tablet 100 mg       ? 100 mg Oral Daily 09/03/21 1334 09/13/21 0959  ? 09/03/21 1500  fluconazole (DIFLUCAN) IVPB 200 mg       ? 200 mg ?100 mL/hr over 60 Minutes Intravenous  Once 09/03/21 1334 09/03/21 1637  ? 08/31/21 2030  cefTRIAXone (ROCEPHIN) 1 g in sodium chloride 0.9 % 100 mL IVPB       ? 1 g ?200 mL/hr over 30 Minutes Intravenous  Once 08/31/21 2026 08/31/21 2242  ? ?  ? ? ? ?DVT prophylaxis: SCDs ? ?Code Status: Full code ? ?Family Communication: No family at bedside ? ? ?CONSULTS gastroenterology ? ? ?Objective  ? ? ?Physical Examination: ? ? ?General-appears in no acute distress ?Heart-S1-S2, regular, no murmur auscultated ?Lungs-clear to auscultation bilaterally, no wheezing or crackles auscultated ?Abdomen-soft, nontender, no organomegaly ?Extremities-no edema in the lower extremities ?Neuro-alert, oriented x3, no focal deficit noted ? ?Status is: Inpatient: GI bleed ? ? ? ?  ? ? ?[name] ?  ?[facility] ?If 7PM-7AM, please contact night-coverage at [email or web address], ?Office  [phone] ? ? ?09/08/2021, 1:49 PM  LOS: 7 days  ? ? ? ? ? ? ? ? ? ? ?  ?

## 2021-09-08 NOTE — Consult Note (Signed)
?   ? ? ? ? ?[facility] for Infectious Disease   ? ?Date of Admission:  08/31/2021    ? ?Reason for Consult: staph aureus liver abscess    ?Referring Provider: [place] ? ? ? ? ?Lines:  ?Peripheral iv's ? ?Abx: ?4/18-c vancomycin ?4/14-c fluconazole ?      ? ? ?Assessment: ?46 yo female substance abuse (denies ivdu -- only snorted heroin), hx roux-n-y gastric bypass distant past, hx left fibular orif distant past, admitted 08/31/21 for a week of feeling ill with left toe redness/pain, left hand/wrist swelling-pain, and black/bloody stool, found to have imaging evidence multiple liver mass/abscesses and cirrhosis, and on liver mass biopsy cx growing staph aureus. Other complication include esophageal candidiasis, and imaging suggestion of portal vein thrombus. ? ?Id w/u: ?4/18 bcx in progress ?4/17 liver mass/abscess biopsy cx gpc (staph aureus pending susceptibility testing); pathology pending ?4/13 Acute hepatitis panel negative; hiv screen negative ?4/13 egd with evidence suggestive of esophageal candidiasis ? ?GI involved for cirrhosis w/u and gib -- egd showed no evidence of varices ? ? ?#staph aureus in liver ?#left hand/wrist swelling ?#left big toe dry gangrene/redness ?#liver abscesses ?#thoracolumbar spine tenderness ?#hx left fibular orif ?Concerning for endocarditis presentation with septic emboli ?Tender spines will get mri to r/o OM spine and epidural abscesses ?Left wrist/hand swelling/fluctuance concerning for deep tissue abscess will also get mri for that. She'll need orthopedic evaluation ?Her left great toe already is evaluated by vascular sugery. The xray a week ago showed no osseous involvement. Will see how she responds to abx as suspects septic embolus there as well ?Will need at least 6 weeks antibiotics tx ? ? ? ?#cirrhosis -- highly suspected ?#portal vein thrombosis ?#gib ?#thrombocytopenia ?Portal vein alone difficult to account for acute ascites ?Gi following ?4/17 liver biopsy in  progress ?4/14 egd showed ulcer esophageal infection; cta showed no active bleed either ?Ascites not sampled/analyzed yet likely will need this admission ?Given gib and ascites/cirrhosis, probably would benefit from 5 day prophylactic ceftriaxone ? ?Liver biopsy is pending ? ? ?#PUD ?Egd 4/13 with jejunal ulcers/esophageal ulcers-candida ?GI following ?On protoniz/sucralfate ? ?Plan: ?Get blood cultures ?Recommend paracentesis with cell count, differential, albumin, protein, and culture ?Would give 5 day ceftriaxone 1 gram daily in setting gib/cirrhosis ?Continue fluconazole -- increase dose to 200 mg daily; plan 2 weeks tx until 4/30 ?Continue vancomycin; f/u staph aureus liver cx susceptibility ?Please consult orthopedic for her left hand/wrist ?Will place mri thoracolumbar spine and left hand ?Will order tte ?Discussed with primary team ? ? ? ? ? ?------------------------------------------------ ?Principal Problem: ?  Acute upper GI bleed ?Active Problems: ?  Essential hypertension ?  Polysubstance abuse ([place]) ?  Acute on chronic blood loss anemia ?  Portal vein thrombosis ?  Lactic acidosis ?  Hepatic lesion ?  Candida infection, esophageal ([place]) ? ? ? ?HPI: Terri [name] is a 46 y.o. female polysubstance abuse, hx roux n y, hx left fib orif, admitted for melena/bright blood per rectum found to have cirrhosis along with liver abscesses with staph aureus ? ?She hasn't been feeling well for a week prior to presentation. ?Reported left red painful toe, left wrist/hand swelling, malaise, weakness, nausea, blood/melena per rectum ?Also report mid back pain ? ?No other rash, f, c ? ?Denies ivdu. Only snorted heroine and last done it a week prior to admission ? ? ?On presentation afebrile, stable hemodynamics ?Wbc 16 ?Abd imaging including mri showed portal vein thrombus, ascites, and multiple liver masses/abscesses/ascites  but no actual splenomegaly or cirrhosis description ? ?She has ct abd/pelv/chest 06/22/21 with  emphysema, cortical kidney scarring/stones, ascites but no other sign liver cirrhosis ? ?Gi following -- 4/17 liver biopsy done showed staph aureus on culture. Path pending. Egd 4/13 with PUD and esophageal candidiasis.  ? ?Vascular surgery evaluated left toe -- suspected embolic changes no surgical intervention ? ?Previously 2 weeks ago she had imaging xray of the left foot and there was no osseous involvement ? ? ? ?Family History  ?Problem Relation Age of Onset  ? Diabetes Mother   ? Hypertension Mother   ? Diabetes Father   ? Hypertension Father   ? ? ?Social History  ? ?Tobacco Use  ? Smoking status: Every Day  ?  Packs/day: 1.00  ?  Years: 29.00  ?  Pack years: 29.00  ?  Types: Cigarettes  ? Smokeless tobacco: Never  ?Vaping Use  ? Vaping Use: Never used  ?Substance Use Topics  ? Alcohol use: No  ? Drug use: Yes  ?  Types: Cocaine, Heroin  ?  Comment: Patient states she smokes cocaine  ? ? ?Allergies  ?Allergen Reactions  ? Nsaids Other (See Comments)  ?  Stomach ulcer ?  ? Ketorolac Tromethamine Itching and Nausea And Vomiting  ? ? ?Review of Systems: ?ROS ?All Other ROS was negative, except mentioned above ? ? ?Past Medical History:  ?Diagnosis Date  ? Arthritis   ? Back and legs  ? Chronic back pain   ? DVT (deep venous thrombosis) ([place])   ? early 20's leg  ? GIB (gastrointestinal bleeding) 09/13/2017  ? History of blood transfusion   ? Hypertension   ? Migraine headache   ? Mood disorder ([place])   ? previously documented as bipolar and MDD  ? Neuropathy   ? Pneumonia   ? 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 Sciatica   ? Stress incontinence   ? ? ? ? ? ?Scheduled Meds: ? amLODipine  10 mg Oral Daily  ? Chlorhexidine Gluconate Cloth  6 each Topical Daily  ? fluconazole  100 mg Oral Daily  ? guaiFENesin  600 mg Oral BID  ? hydrALAZINE  50 mg Oral BID  ? lidocaine  1 patch Transdermal Daily  ? lisinopril  10 mg Oral QHS  ? nicotine  21 mg Transdermal Daily  ? pantoprazole (PROTONIX) IV  40 mg Intravenous  Q12H  ? potassium chloride  40 mEq Oral Once  ? sucralfate  1 g Oral TID WC & HS  ? ?Continuous Infusions: ? vancomycin    ? ?PRN Meds:.acetaminophen **OR** acetaminophen, guaiFENesin-dextromethorphan, HYDROmorphone (DILAUDID) injection, ipratropium-albuterol, labetalol, lip balm, naLOXone (NARCAN)  injection, ondansetron (ZOFRAN) IV ? ? ?OBJECTIVE: ?Blood pressure (!) 125/91, pulse 100, temperature 98.2 ?F (36.8 ?C), temperature source Oral, resp. rate 19, height '5\' 9"'$  (1.753 m), weight 62 kg, SpO2 94 %. ? ?Physical Exam ? ?General/constitutional: emotional, cachectic, in distress complaining of back, left big toe and hand/wrist pain ?HEENT: temporal muscle wasting; edentulous, per, conj clear, oropharynx clear ?Neck supple ?CV: rrr no mrg ?Lungs: clear to auscultation, normal respiratory effort ?Abd: Soft, Nontender -- midline scar  prior roux-n-y ?Ext: no edema ?Msk/Skin: left hand swelling and tender fluctuance; unable to grip left hand; left big toe distal necrosis and proximal redness and very tender on ip joint rom; left fibular no redness/tenderness; midline thoracic/lumbar tenderness ?Neuro: nonfocal ?Alert/oriented ? ?Central line presence: no ? ? ?Lab Results ?Lab Results  ?  Component Value Date  ? WBC 12.1 (H) 09/06/2021  ? HGB 9.4 (L) 09/06/2021  ? HCT 28.1 (L) 09/06/2021  ? MCV 75.7 (L) 09/06/2021  ? PLT 119 (L) 09/06/2021  ?  ?Lab Results  ?Component Value Date  ? CREATININE 0.60 09/06/2021  ? BUN 22 (H) 09/06/2021  ? NA 137 09/06/2021  ? K 3.4 (L) 09/06/2021  ? CL 110 09/06/2021  ? CO2 21 (L) 09/06/2021  ?  ?Lab Results  ?Component Value Date  ? ALT 28 09/06/2021  ? AST 25 09/06/2021  ? ALKPHOS 186 (H) 09/06/2021  ? BILITOT 0.6 09/06/2021  ?  ? ? ?Microbiology: ?Recent Results (from the past 240 hour(s))  ?Aerobic/Anaerobic Culture w Gram Stain (surgical/deep wound)     Status: None (Preliminary result)  ? Collection Time: 09/07/21  3:15 PM  ? Specimen: Abscess  ?Result Value Ref Range Status  ?  Specimen Description   Final  ?  ABSCESS ?Performed at [facility], [place] [address] [place], [place] [address] ?  ? Special Requests   Final  ?  Normal ?Performed at [facility],

## 2021-09-09 ENCOUNTER — Inpatient Hospital Stay (HOSPITAL_COMMUNITY): Payer: Medicaid Other

## 2021-09-09 DIAGNOSIS — I10 Essential (primary) hypertension: Secondary | ICD-10-CM | POA: Diagnosis not present

## 2021-09-09 DIAGNOSIS — K750 Abscess of liver: Secondary | ICD-10-CM | POA: Diagnosis not present

## 2021-09-09 DIAGNOSIS — R7881 Bacteremia: Secondary | ICD-10-CM

## 2021-09-09 DIAGNOSIS — K922 Gastrointestinal hemorrhage, unspecified: Secondary | ICD-10-CM | POA: Diagnosis not present

## 2021-09-09 DIAGNOSIS — I96 Gangrene, not elsewhere classified: Secondary | ICD-10-CM | POA: Diagnosis not present

## 2021-09-09 LAB — CBC
HCT: 25.4 % — ABNORMAL LOW (ref 36.0–46.0)
Hemoglobin: 8.6 g/dL — ABNORMAL LOW (ref 12.0–15.0)
MCH: 26.2 pg (ref 26.0–34.0)
MCHC: 33.9 g/dL (ref 30.0–36.0)
MCV: 77.4 fL — ABNORMAL LOW (ref 80.0–100.0)
Platelets: 139 10*3/uL — ABNORMAL LOW (ref 150–400)
RBC: 3.28 MIL/uL — ABNORMAL LOW (ref 3.87–5.11)
RDW: 24.5 % — ABNORMAL HIGH (ref 11.5–15.5)
WBC: 10.7 10*3/uL — ABNORMAL HIGH (ref 4.0–10.5)
nRBC: 0 % (ref 0.0–0.2)

## 2021-09-09 LAB — COMPREHENSIVE METABOLIC PANEL
ALT: 40 U/L (ref 0–44)
AST: 35 U/L (ref 15–41)
Albumin: <1.5 g/dL — ABNORMAL LOW (ref 3.5–5.0)
Alkaline Phosphatase: 264 U/L — ABNORMAL HIGH (ref 38–126)
Anion gap: 4 — ABNORMAL LOW (ref 5–15)
BUN: 18 mg/dL (ref 6–20)
CO2: 22 mmol/L (ref 22–32)
Calcium: 7 mg/dL — ABNORMAL LOW (ref 8.9–10.3)
Chloride: 108 mmol/L (ref 98–111)
Creatinine, Ser: 0.57 mg/dL (ref 0.44–1.00)
GFR, Estimated: >60 mL/min (ref >60)
Glucose, Bld: 89 mg/dL (ref 70–99)
Potassium: 3.1 mmol/L — ABNORMAL LOW (ref 3.5–5.1)
Sodium: 134 mmol/L — ABNORMAL LOW (ref 135–145)
Total Bilirubin: 0.3 mg/dL (ref 0.3–1.2)
Total Protein: 4.8 g/dL — ABNORMAL LOW (ref 6.5–8.1)

## 2021-09-09 LAB — ECHOCARDIOGRAM COMPLETE
Area-P 1/2: 6.32 cm2
Calc EF: 64.3 %
Height: 69 in
S' Lateral: 2.2 cm
Single Plane A2C EF: 68.8 %
Single Plane A4C EF: 62.1 %
Weight: 2186.96 oz

## 2021-09-09 LAB — MAGNESIUM: Magnesium: 1.8 mg/dL (ref 1.7–2.4)

## 2021-09-09 LAB — SURGICAL PATHOLOGY

## 2021-09-09 MED ORDER — CEFAZOLIN SODIUM-DEXTROSE 2-4 GM/100ML-% IV SOLN
2.0000 g | Freq: Three times a day (TID) | INTRAVENOUS | Status: DC
  Administered 2021-09-09 – 2021-09-18 (×27): 2 g via INTRAVENOUS
  Filled 2021-09-09 (×28): qty 100

## 2021-09-09 MED ORDER — HYDROCODONE-ACETAMINOPHEN 5-325 MG PO TABS
1.0000 | ORAL_TABLET | ORAL | Status: DC | PRN
  Administered 2021-09-10 – 2021-09-11 (×4): 2 via ORAL
  Administered 2021-09-11: 1 via ORAL
  Administered 2021-09-11: 2 via ORAL
  Administered 2021-09-12 – 2021-09-15 (×14): 1 via ORAL
  Administered 2021-09-16 (×3): 2 via ORAL
  Filled 2021-09-09: qty 2
  Filled 2021-09-09 (×3): qty 1
  Filled 2021-09-09: qty 2
  Filled 2021-09-09: qty 1
  Filled 2021-09-09: qty 2
  Filled 2021-09-09 (×3): qty 1
  Filled 2021-09-09: qty 2
  Filled 2021-09-09 (×5): qty 1
  Filled 2021-09-09: qty 2
  Filled 2021-09-09 (×2): qty 1
  Filled 2021-09-09: qty 2
  Filled 2021-09-09 (×2): qty 1
  Filled 2021-09-09 (×2): qty 2
  Filled 2021-09-09: qty 1

## 2021-09-09 MED ORDER — POTASSIUM CHLORIDE 10 MEQ/100ML IV SOLN
10.0000 meq | INTRAVENOUS | Status: AC
  Administered 2021-09-09 (×3): 10 meq via INTRAVENOUS
  Filled 2021-09-09 (×3): qty 100

## 2021-09-09 MED ORDER — GADOBUTROL 1 MMOL/ML IV SOLN
6.0000 mL | Freq: Once | INTRAVENOUS | Status: AC | PRN
  Administered 2021-09-09: 6 mL via INTRAVENOUS

## 2021-09-09 NOTE — Progress Notes (Signed)
2D echo attempted, but patient not in room, RN stated she is downstairs for testing. Will try later ?

## 2021-09-09 NOTE — Progress Notes (Addendum)
Id brief note ? ?Patient away for procedure when we attempt to visit this morning ? ?Liver abscess culture showing mssa ? ? ?A/p ?Disseminated mssa infection/sepsis ? ? ?-f/u left hand/wrist mri, thoracolumbar mri, echo, and blood cx ?-stopped vancomycin; continue ceftriaxone for 4 more days until 4/22, then switch to cefazolin ?

## 2021-09-09 NOTE — Progress Notes (Signed)
?  Echocardiogram ?2D Echocardiogram has been performed. ? ?Terri [name] ?09/09/2021, 2:29 PM ?

## 2021-09-09 NOTE — Progress Notes (Signed)
Patient presented to radiology for paracentesis today.  ? ?Abdomen visualized with ultrasound.  ?No fluid collection for safe approach found.  ?Ultrasound images saved for documentation purpose.  ? ?Paracentesis was NOT performed.  ? ?Tera Mater PA-C ?09/09/2021 3:02 PM ? ? ? ?

## 2021-09-09 NOTE — Progress Notes (Signed)
Patient given prn dilaudid for pain 10/10.  Pushed medication through [facility] line infusing in patient's IV and patient became agitated stating she doesn't "feel the high" when we give it like that.  Educated patient that she is receiving the same dosage and that it is for treatment of her pain.   ?

## 2021-09-09 NOTE — TOC Progression Note (Signed)
Transition of Care (TOC) - Progression Note  ? ? ?Patient Details  ?Name: Terri [name] ?MRN: [id number] ?Date of Birth: 10/03/1975 ? ?Transition of Care (TOC) CM/SW Contact  ?Purcell Mouton, RN ?Phone Number: ?09/09/2021, 3:13 PM ? ?Clinical Narrative:    ? ?TOC following for discharge. ? ?Expected Discharge Plan: Home/Self Care ?Barriers to Discharge: Continued Medical Work up ? ?Expected Discharge Plan and Services ?Expected Discharge Plan: Home/Self Care ?  ?Discharge Planning Services: CM Consult ?  ?Living arrangements for the past 2 months: No permanent address ?                ?  ?  ?  ?  ?  ?  ?  ?  ?  ?  ? ? ?Social Determinants of Health (SDOH) Interventions ?  ? ?Readmission Risk Interventions ? ?  09/02/2021  ?  2:25 PM  ?Readmission Risk Prevention Plan  ?Transportation Screening Complete  ?Medication Review Press photographer) Complete  ?PCP or Specialist appointment within 3-5 days of discharge Complete  ?Palliative Care Screening Not Applicable  ?[place] Not Applicable  ? ? ?

## 2021-09-09 NOTE — Plan of Care (Signed)
  Problem: Education: Goal: Ability to identify signs and symptoms of gastrointestinal bleeding will improve Outcome: Progressing   Problem: Clinical Measurements: Goal: Complications related to the disease process, condition or treatment will be avoided or minimized Outcome: Progressing   

## 2021-09-09 NOTE — Progress Notes (Signed)
[facility] ? ?PROGRESS NOTE ? ?Terri [name] VHQ:[id number] DOB: 1975-08-29 DOA: 08/31/2021 ?PCP: Pcp, No ? ? ?Brief HPI:   ?46 year old female with history of acute upper GI bleed in setting of peptic ulcer disease, acute lower GI bleeding 2021, essential hypertension, chronic blood loss anemia, polysubstance abuse, portal vein thrombosis was admitted to [facility] for acute GI bleed after presenting from home complaining of dark-colored stool.  Gastroenterology was consulted, EGD on 09/03/2021 showed esophageal plaques consistent with candidiasis, large cratered nonbleeding duodenal ulcer with no stigmata of bleeding.  CT angiogram on 08/31/2021 showed multiple low-attenuation lesions throughout the liver seen on recent prior exam, highly concerning for hepatic abscesses or metastasis. ? ?Biopsy showed abscess so culture was sent.  Growing Staph aureus. ? ?Subjective  ? ?Patient seen and examined, underwent MRI of thoracic and lumbar spine which did not show any discitis or osteomyelitis. ? ? Assessment/Plan:  ? ?Upper GI bleed ?-Patient had 2 episodes of melena at home ?-Gastroenterology consulted ?-Underwent EGD on 09/03/2021 ?-EGD showed esophageal plaques consistent with candidiasis, large cratered nonbleeding duodenal ulcer with no stigmata of bleeding ?-Today hemoglobin is stable at 10.2 ?-Continue PPI, Carafate, fluconazole ?-Patient is on dysphagia 3 diet ? ?Liver  abscess ?-CTA abdomen/pelvis showed multiple low-attenuation lesions throughout the liver, highly concerning for hepatic abscesses or metastasis ?-MRI liver shows hepatic metastasis. ?-IR was consulted for liver biopsy  ?-Underwent liver biopsy which showed abscess. ?-Liver abscess growing Staph aureus ?-Started on  vancomycin per pharmacy consult ?-ID consulted ?-TTE done today, if negative consider TEE ?-oxycodone was discontinued and started on  Dilaudid 1 mg IV every 4 hours as needed ?-CA 19-9, CEA, AFP are all  unremarkable ? ?Portal vein thrombosis ?-Seen on CTA abdomen/pelvis ?-Not a candidate for anticoagulation ? 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 ?Hypertension ?-Blood pressure is stable ?-Continue amlodipine, lisinopril, hydralazine ? ?History of polysubstance abuse ?-TOC consulted for outpatient substance abuse rehab ?-UDS positive for cocaine ? ?Hypokalemia ?-Potassium is 3.1, replace potassium and follow BMP in am ? ?Medications ? ?  ? amLODipine  10 mg Oral Daily  ? Chlorhexidine Gluconate Cloth  6 each Topical Daily  ? fluconazole  200 mg Oral Daily  ? guaiFENesin  600 mg Oral BID  ? hydrALAZINE  50 mg Oral BID  ? lidocaine  1 patch Transdermal Daily  ? lisinopril  10 mg Oral QHS  ? nicotine  21 mg Transdermal Daily  ? pantoprazole (PROTONIX) IV  40 mg Intravenous Q12H  ? potassium chloride  40 mEq Oral Once  ? sucralfate  1 g Oral TID WC & HS  ? ? ? Data Reviewed:  ? ?CBG: ? ?No results for input(s): GLUCAP in the last 168 hours. ? ? ?SpO2: 91 % ?O2 Flow Rate (L/min): 4 L/min  ? ? ?Vitals:  ? 09/08/21 0500 09/08/21 0846 09/08/21 1700 09/09/21 0917  ?BP: (!) 125/91   (!) 121/96  ?Pulse: 100  99   ?Resp: 19   16  ?Temp: 98.2 ?F (36.8 ?C)  98.5 ?F (36.9 ?C) 98 ?F (36.7 ?C)  ?TempSrc: Oral   Oral  ?SpO2: 92% 94% 91%   ?Weight: 62 kg     ?Height:      ? ? ? ? ?Data Reviewed: ? ?  Basic Metabolic Panel: ?Recent Labs  ?Lab 09/02/21 ?1622 09/06/21 ?7001 09/09/21 ?7494  ?NA 132* 137 134*  ?K 4.2 3.4* 3.1*  ?CL 109 110 108  ?CO2 16* 21* 22  ?GLUCOSE 100* 59* 89  ?BUN 40* 22* 18  ?CREATININE 0.73 0.60 0.57  ?CALCIUM 7.3* 7.5* 7.0*  ?MG  --   --  1.8  ?PHOS 3.0  --   --   ? ? ?CBC: ?Recent Labs  ?Lab 09/02/21 ?1622 09/05/21 ?0618 09/06/21 ?4967  09/09/21 ?5916  ?WBC 15.6* 13.5* 12.1* 10.7*  ?NEUTROABS  --   --  10.1*  --   ?HGB 10.2* 9.3* 9.4* 8.6*  ?HCT 29.8* 27.7* 28.1* 25.4*  ?MCV 73.6* 75.3* 75.7* 77.4*  ?PLT 116* 144* 119* 139*  ? ? ?LFT ?Recent Labs  ?Lab 09/02/21 ?1622 09/06/21 ?3846 09/09/21 ?6599  ?AST  --  25 35  ?ALT  --  28 40  ?ALKPHOS  --  186* 264*  ?BILITOT  --  0.6 0.3  ?PROT  --  4.8* 4.8*  ?ALBUMIN 1.7* 1.6* <1.5*  ? ?  ?Antibiotics: ?Anti-infectives (From admission, onward)  ? ? Start     Dose/Rate Route Frequency Ordered Stop  ? 09/09/21 1000  fluconazole (DIFLUCAN) tablet 200 mg       ? 200 mg Oral Daily 09/08/21 1504 09/21/21 0959  ? 09/08/21 2300  vancomycin (VANCOREADY) IVPB 750 mg/150 mL       ? 750 mg ?150 mL/hr over 60 Minutes Intravenous Every 12 hours 09/08/21 1008    ? 09/08/21 1600  cefTRIAXone (ROCEPHIN) 1 g in sodium chloride 0.9 % 100 mL IVPB       ? 1 g ?200 mL/hr over 30 Minutes Intravenous Every 24 hours 09/08/21 1513 09/13/21 1559  ? 09/08/21 1100  vancomycin (VANCOREADY) IVPB 1250 mg/250 mL       ? 1,250 mg ?166.7 mL/hr over 90 Minutes Intravenous  Once 09/08/21 1005 09/08/21 1230  ? 09/04/21 1000  fluconazole (DIFLUCAN) tablet 100 mg  Status:  Discontinued       ? 100 mg Oral Daily 09/03/21 1334 09/08/21 1504  ? 09/03/21 1500  fluconazole (DIFLUCAN) IVPB 200 mg       ? 200 mg ?100 mL/hr over 60 Minutes Intravenous  Once 09/03/21 1334 09/03/21 1637  ? 08/31/21 2030  cefTRIAXone (ROCEPHIN) 1 g in sodium chloride 0.9 % 100 mL IVPB       ? 1 g ?200 mL/hr over 30 Minutes Intravenous  Once 08/31/21 2026 08/31/21 2242  ? ?  ? ? ? ?DVT prophylaxis: SCDs ? ?Code Status: Full code ? ?Family Communication: No family at bedside ? ? ?CONSULTS gastroenterology ? ? ?Objective  ? ? ?Physical Examination: ? ?General-appears in no acute distress ?Heart-S1-S2, regular, no murmur auscultated ?Lungs-clear to auscultation bilaterally, no wheezing or crackles auscultated ?Abdomen-soft, nontender, no organomegaly ?Extremities-no edema in  the lower extremities, left wrist warm to touch, tender to palpation ?Neuro-alert, oriented x3, no focal deficit noted ? ? ?Status is: Inpatient: GI bleed ? ? ? ?  ? ? ?[name] ?  ?[facility] ?If 7PM-7AM, please contact night-coverage at [email or web address], ?Office  [phone] ? ? ?09/09/2021, 2:04 PM  LOS: 8 days  ? ? ? ? ? ? ? ? ? ? ?  ?

## 2021-09-09 NOTE — Progress Notes (Signed)
2D echo attempted, but patient stated that she is eating. Will try echo at later time ?

## 2021-09-09 NOTE — Progress Notes (Signed)
[facility] Progress Note ? ?PEGI MILAZZO 46 y.o. 1975/08/23 ? ?CC:  Upper GI bleed, ascites, liver mets ? ? ?Subjective: ?Seen and examined resting in bed.  She has continued abdominal pain. Denies any new complaints today. No bowel movements today. Awaiting liver biopsy results.  ? ?ROS : Review of Systems  ?Cardiovascular:  Negative for chest pain and palpitations.  ?Gastrointestinal:  Positive for abdominal pain. Negative for blood in stool, constipation, diarrhea, heartburn, melena, nausea and vomiting.  ?Genitourinary:  Negative for dysuria and urgency.  ? ? ? ?Objective: ?Vital signs in last 24 hours: ?Vitals:  ? 09/08/21 1700 09/09/21 0917  ?BP:  (!) 121/96  ?Pulse: 99   ?Resp:  16  ?Temp: 98.5 ?F (36.9 ?C) 98 ?F (36.7 ?C)  ?SpO2: 91%   ? ? ?Physical Exam: ? ?General:  Alert, cooperative, no distress, appears older than stated age, ill appearing, prominent pallor.   ?Head:  Normocephalic, without obvious abnormality, atraumatic  ?Eyes:  Anicteric sclera, EOM's intact, conjunctival pallor  ?Lungs:   Clear to auscultation bilaterally, respirations unlabored  ?Heart:  Regular rate and rhythm, S1, S2 normal  ?Abdomen:   Soft, generalized abdominal tenderness to palpation, bowel sounds active all four quadrants,  no masses,   ?Extremities: Extremities normal, atraumatic, no  edema  ?Pulses: 2+ and symmetric  ? ? ?Lab Results: ?Recent Labs  ?  09/09/21 ?3810  ?NA 134*  ?K 3.1*  ?CL 108  ?CO2 22  ?GLUCOSE 89  ?BUN 18  ?CREATININE 0.57  ?CALCIUM 7.0*  ?MG 1.8  ? ?Recent Labs  ?  09/09/21 ?0738  ?AST 35  ?ALT 40  ?ALKPHOS 264*  ?BILITOT 0.3  ?PROT 4.8*  ?ALBUMIN <1.5*  ? ?Recent Labs  ?  09/09/21 ?0738  ?WBC 10.7*  ?HGB 8.6*  ?HCT 25.4*  ?MCV 77.4*  ?PLT 139*  ? ?No results for input(s): LABPROT, INR in the last 72 hours. ? ? ? ?Assessment ?Liver abscess: Abundant Staphylococcus aureus species, susceptibilities to follow ? ?Upper GI bleed, melena ?- Hgb 4.6 on presentation to ED, improved to 9.6 after 3  units PRBCs ?- Hgb 8.6 (9.4) ?- BUN 18 improving, Cr. 0.57 ?- CT angio 08/31/21 - Exam severely limited by photopenia; however, no intraluminal contrast extravasation to localize GI bleeding.  Large burden of thrombus within the central portal vein as well as the right portal vein and branches as seen on recent exam dated 08/25/2021.  Multiple low-attenuation lesions throughout the liver, seen on recent prior exam, highly concerning for hepatic abscesses or metastases.  Moderate volume ascites throughout the abdomen and pelvis similar to prior. ?- EGD 09/03/2021 shows esophageal plaques consistent with candidiasis.  Large cratered nonbleeding jejunal ulcer with no stigmata of bleeding.  ?  ?Liver mets ?- AFP, CA 19-9 normal ?- CEA 5.2 ?- Hepatitis panel negative ?  ?Liver biopsy on 4/17: awaiting biopsy results.  ?  ?Plan: ?- Continue Protonix 40 mg every 12 hours along with sucralfate 1 g 4 times a day for large gastric ulcers seen on EGD 09/03/2021. ?- Continue fluconazole 100 mg daily for suspected candidiasis noted on EGD. ?- Awaitng liver biopsy results.  ?- Continue daily CBC and transfuse as needed to maintain HGB > 7  ?- [facility] will follow ? ?Charlott Rakes PA-C ?09/09/2021, 11:03 AM ? ?Contact #  [phone]  ?

## 2021-09-09 NOTE — Care Plan (Signed)
Pt unable to tolerate MRI of the hand. Pt was able to complete MRI of the thoracic and lumbar spine with and without.  ?

## 2021-09-10 DIAGNOSIS — A4901 Methicillin susceptible Staphylococcus aureus infection, unspecified site: Secondary | ICD-10-CM

## 2021-09-10 DIAGNOSIS — K922 Gastrointestinal hemorrhage, unspecified: Secondary | ICD-10-CM | POA: Diagnosis not present

## 2021-09-10 DIAGNOSIS — I10 Essential (primary) hypertension: Secondary | ICD-10-CM | POA: Diagnosis not present

## 2021-09-10 DIAGNOSIS — I96 Gangrene, not elsewhere classified: Secondary | ICD-10-CM | POA: Diagnosis not present

## 2021-09-10 DIAGNOSIS — K750 Abscess of liver: Secondary | ICD-10-CM | POA: Diagnosis not present

## 2021-09-10 LAB — BASIC METABOLIC PANEL
Anion gap: 3 — ABNORMAL LOW (ref 5–15)
BUN: 13 mg/dL (ref 6–20)
CO2: 23 mmol/L (ref 22–32)
Calcium: 7 mg/dL — ABNORMAL LOW (ref 8.9–10.3)
Chloride: 107 mmol/L (ref 98–111)
Creatinine, Ser: 0.5 mg/dL (ref 0.44–1.00)
GFR, Estimated: >60 mL/min (ref >60)
Glucose, Bld: 96 mg/dL (ref 70–99)
Potassium: 3 mmol/L — ABNORMAL LOW (ref 3.5–5.1)
Sodium: 133 mmol/L — ABNORMAL LOW (ref 135–145)

## 2021-09-10 MED ORDER — POTASSIUM CHLORIDE 10 MEQ/100ML IV SOLN
10.0000 meq | INTRAVENOUS | Status: AC
  Administered 2021-09-10 (×3): 10 meq via INTRAVENOUS
  Filled 2021-09-10 (×3): qty 100

## 2021-09-10 NOTE — Progress Notes (Signed)
Patient requesting Dilaudid early.  On call MD ok for PO at this time but IV medication to be given at regular time.  ?

## 2021-09-10 NOTE — Progress Notes (Addendum)
[facility] ? ?PROGRESS NOTE ? ?Terri [name] OZD:[id number] DOB: 1976/03/20 DOA: 08/31/2021 ?PCP: Pcp, No ? ? ?Brief HPI:   ?46 year old female with history of acute upper GI bleed in setting of peptic ulcer disease, acute lower GI bleeding 2021, essential hypertension, chronic blood loss anemia, polysubstance abuse, portal vein thrombosis was admitted to [facility] for acute GI bleed after presenting from home complaining of dark-colored stool.  Gastroenterology was consulted, EGD on 09/03/2021 showed esophageal plaques consistent with candidiasis, large cratered nonbleeding duodenal ulcer with no stigmata of bleeding.  CT angiogram on 08/31/2021 showed multiple low-attenuation lesions throughout the liver seen on recent prior exam, highly concerning for hepatic abscesses or metastasis. ? ?Biopsy showed abscess so culture was sent.  Growing Staph aureus. ? ?Subjective  ? ?Patient seen and examined, pain well controlled.  MRI of thoracolumbar spine, TTE obtained yesterday did not show any valvular abnormality. ? ? Assessment/Plan:  ? ?Upper GI bleed ?-Patient had 2 episodes of melena at home ?-Gastroenterology consulted ?-Underwent EGD on 09/03/2021 ?-EGD showed esophageal plaques consistent with candidiasis, large cratered nonbleeding duodenal ulcer with no stigmata of bleeding ?-Today hemoglobin is 8.6 ?-GI has signed off, recommend PPI twice daily for 2 months, sucralfate 4 times daily for 2 weeks ?-Continue PPI, Carafate, fluconazole ?-Patient is on dysphagia 3 diet ? ?Liver  abscess ?-CTA abdomen/pelvis showed multiple low-attenuation lesions throughout the liver, highly concerning for hepatic abscesses or metastasis ?-MRI liver shows hepatic metastasis. ?-IR was consulted for liver biopsy  ?-Underwent liver biopsy which showed abscess. ?-Liver abscess growing Staph aureus ?-Started on  vancomycin per pharmacy consult ?-ID consulted ?-TTE was negative for valvular abnormality, called cardiology for TEE  on Monday ?-oxycodone was discontinued and started on  Dilaudid 1 mg IV every 4 hours as needed ?-CA 19-9, CEA, AFP are all unremarkable ?-GI recommends to repeat CT with contrast or MRI of liver with and without contrast in 4 weeks to confirm that liver lesions were abscess and not metastasis ? ?Portal vein thrombosis ?-Seen on CTA abdomen/pelvis ?-Not a candidate for anticoagulation ? 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 ?Hypertension ?-Blood pressure is stable ?-Continue amlodipine, lisinopril, hydralazine ? ?History of polysubstance abuse ?-TOC consulted for outpatient substance abuse rehab ?-UDS positive for cocaine ? ?Hypokalemia ?-Potassium is 3.0 ?-We will replace potassium ?-Follow BMP in am ? ?Medications ? ?  ? amLODipine  10 mg Oral Daily  ? Chlorhexidine Gluconate Cloth  6 each Topical Daily  ? fluconazole  200 mg Oral Daily  ? guaiFENesin  600 mg Oral BID  ? hydrALAZINE  50 mg Oral BID  ? lidocaine  1 patch Transdermal Daily  ? lisinopril  10 mg Oral QHS  ? nicotine  21 mg Transdermal Daily  ? pantoprazole (PROTONIX) IV  40 mg Intravenous Q12H  ? potassium chloride  40 mEq Oral Once  ? sucralfate  1 g Oral TID WC & HS  ? ? ? Data Reviewed:  ? ?CBG: ? ?No results for input(s): GLUCAP in the last 168 hours. ? ? ?SpO2: 91 % ?O2 Flow Rate (L/min): 4 L/min  ? ? ?Vitals:  ? 09/09/21 2007 09/10/21 0500 09/10/21 0605 09/10/21 0816  ?BP: 118/80  113/80 122/87  ?Pulse:   91 98  ?Resp: 20  18   ?Temp: 99.1 ?F (37.3 ?C)  98.2 ?F (36.8 ?C) 98.2 ?F (36.8 ?C)  ?TempSrc: Oral  Oral Oral  ?SpO2:      ?Weight:  64.1 kg    ?Height:      ? ? ? ? ?Data Reviewed: ? ?Basic Metabolic Panel: ?Recent Labs  ?Lab 09/06/21 ?1607 09/09/21 ?3710  09/10/21 ?0350  ?NA 137 134* 133*  ?K 3.4* 3.1* 3.0*  ?CL 110 108 107  ?CO2 21* 22 23  ?GLUCOSE 59* 89 96  ?BUN 22* 18 13  ?CREATININE 0.60 0.57 0.50  ?CALCIUM 7.5* 7.0* 7.0*  ?MG  --  1.8  --   ? ? ?CBC: ?Recent Labs  ?Lab 09/05/21 ?0618 09/06/21 ?6269 09/09/21 ?4854  ?WBC 13.5* 12.1* 10.7*  ?NEUTROABS  --  10.1*  --   ?HGB 9.3* 9.4* 8.6*  ?HCT 27.7* 28.1* 25.4*  ?MCV 75.3* 75.7* 77.4*  ?PLT 144* 119* 139*  ? ? ?LFT ?Recent Labs  ?Lab 09/06/21 ?6270 09/09/21 ?3500  ?AST 25 35  ?ALT 28 40  ?ALKPHOS 186* 264*  ?BILITOT 0.6 0.3  ?PROT 4.8* 4.8*  ?ALBUMIN 1.6* <1.5*  ? ?  ?Antibiotics: ?Anti-infectives (From admission, onward)  ? ? Start     Dose/Rate Route Frequency Ordered Stop  ? 09/09/21 1500  ceFAZolin (ANCEF) IVPB 2g/100 mL premix       ? 2 g ?200 mL/hr over 30 Minutes Intravenous Every 8 hours 09/09/21 1407    ? 09/09/21 1000  fluconazole (DIFLUCAN) tablet 200 mg       ? 200 mg Oral Daily 09/08/21 1504 09/21/21 0959  ? 09/08/21 2300  vancomycin (VANCOREADY) IVPB 750 mg/150 mL  Status:  Discontinued       ? 750 mg ?150 mL/hr over 60 Minutes Intravenous Every 12 hours 09/08/21 1008 09/09/21 1407  ? 09/08/21 1600  cefTRIAXone (ROCEPHIN) 1 g in sodium chloride 0.9 % 100 mL IVPB  Status:  Discontinued       ? 1 g ?200 mL/hr over 30 Minutes Intravenous Every 24 hours 09/08/21 1513 09/09/21 1407  ? 09/08/21 1100  vancomycin (VANCOREADY) IVPB 1250 mg/250 mL       ? 1,250 mg ?166.7 mL/hr over 90 Minutes Intravenous  Once 09/08/21 1005 09/08/21 1230  ? 09/04/21 1000  fluconazole (DIFLUCAN) tablet 100 mg  Status:  Discontinued       ? 100 mg Oral Daily 09/03/21 1334 09/08/21 1504  ? 09/03/21 1500  fluconazole (DIFLUCAN) IVPB 200 mg       ? 200 mg ?100 mL/hr over 60 Minutes Intravenous  Once 09/03/21 1334 09/03/21 1637  ? 08/31/21 2030  cefTRIAXone (ROCEPHIN) 1 g in sodium chloride 0.9 % 100 mL IVPB       ? 1 g ?200 mL/hr over 30 Minutes Intravenous  Once 08/31/21 2026 08/31/21 2242  ? ?  ? ? ? ?DVT prophylaxis:  SCDs ? ?Code Status: Full code ? ?Family Communication: No family at bedside ? ? ?CONSULTS gastroenterology ? ? ?Objective  ? ? ?Physical Examination: ? ?General-appears in no acute distress ?Heart-S1-S2, regular, no murmur auscultated ?Lungs-clear to auscultation bilaterally, no wheezing or crackles auscultated ?Abdomen-soft, mild generalized tenderness to palpation,  no organomegaly ?Extremities-no edema in the lower extremities ?Neuro-alert, oriented x3, no focal deficit noted ? ? ?Status is: Inpatient: GI bleed ? ? ?  ? ? ?Terri [name] ?  ?[facility] ?If 7PM-7AM, please contact night-coverage at [email or web address], ?Office  [phone] ? ? ?09/10/2021, 1:56 PM  LOS: 9 days  ? ? ? ? ? ? ? ? ? ? ?  ?

## 2021-09-10 NOTE — Progress Notes (Signed)
[facility] Progress Note ? ?Terri [name] 46 y.o. 02-17-1976 ? ?CC:  Upper GI bleed, ascites, liver mets ? ? ?Subjective: ?Seen and examined resting in bed.  She has continued abdominal pain. Denies any new complaints today. No bowel movements today.  She is eating breakfast, she is not very hungry ate half a pancake and drank her coffee. ? ?Paracentesis unable to be able to be perform due to no  fluid collection for safe approach found. ? ?Liver biopsy with acute inflammation with necrosis compatible with abscess.  Granulomatous inflammation with eosinophilia and fragment of foreign material negative for malignancy. ? ?ROS : Review of Systems  ?Gastrointestinal:  Positive for abdominal pain. Negative for blood in stool, constipation, diarrhea, heartburn, melena, nausea and vomiting.  ?Genitourinary:  Negative for dysuria and urgency.  ?Neurological:  Negative for dizziness and headaches.  ? ? ? ?Objective: ?Vital signs in last 24 hours: ?Vitals:  ? 09/10/21 0605 09/10/21 0816  ?BP: 113/80 122/87  ?Pulse: 91 98  ?Resp: 18   ?Temp: 98.2 ?F (36.8 ?C) 98.2 ?F (36.8 ?C)  ?SpO2:    ? ? ?Physical Exam: ? ?General:  Alert, cooperative, no distress, appears stated age  ?Head:  Normocephalic, without obvious abnormality, atraumatic  ?Eyes:  Anicteric sclera, EOM's intact  ?Lungs:   Clear to auscultation bilaterally, respirations unlabored  ?Heart:  Regular rate and rhythm, S1, S2 normal  ?Abdomen:   Soft, non-tender, bowel sounds active all four quadrants,  no masses,   ?Extremities: Extremities normal, atraumatic, no  edema  ?Pulses: 2+ and symmetric  ? ? ?Lab Results: ?Recent Labs  ?  09/09/21 ?2694 09/10/21 ?0350  ?NA 134* 133*  ?K 3.1* 3.0*  ?CL 108 107  ?CO2 22 23  ?GLUCOSE 89 96  ?BUN 18 13  ?CREATININE 0.57 0.50  ?CALCIUM 7.0* 7.0*  ?MG 1.8  --   ? ?Recent Labs  ?  09/09/21 ?0738  ?AST 35  ?ALT 40  ?ALKPHOS 264*  ?BILITOT 0.3  ?PROT 4.8*  ?ALBUMIN <1.5*  ? ?Recent Labs  ?  09/09/21 ?0738  ?WBC 10.7*  ?HGB  8.6*  ?HCT 25.4*  ?MCV 77.4*  ?PLT 139*  ? ?No results for input(s): LABPROT, INR in the last 72 hours. ? ? ? ?Assessment ?Liver abscess: Abundant Staphylococcus aureus species ?  ?Upper GI bleed, melena ?- Hgb 4.6 on presentation to ED, improved to 9.6 after 3 units PRBCs ?- Hgb 8.6 (9.4) ?- BUN 18 improving, Cr. 0.57 ?- CT angio 08/31/21 - Exam severely limited by photopenia; however, no intraluminal contrast extravasation to localize GI bleeding.  Large burden of thrombus within the central portal vein as well as the right portal vein and branches as seen on recent exam dated 08/25/2021.  Multiple low-attenuation lesions throughout the liver, seen on recent prior exam, highly concerning for hepatic abscesses or metastases.  Moderate volume ascites throughout the abdomen and pelvis similar to prior. ?- EGD 09/03/2021 shows esophageal plaques consistent with candidiasis.  Large cratered nonbleeding jejunal ulcer with no stigmata of bleeding.  ? ?- AFP, CA 19-9 normal ?- CEA 5.2 ?- Hepatitis panel negative ? ?Liver biopsy with acute inflammation with necrosis compatible with abscess.  Granulomatous inflammation with eosinophilia and fragment of foreign material negative for malignancy. ? ?Disseminated MSSA infection/sepsis and liver abscess ? ?Large anastomotic ulcer noted, hemoglobin stable, continue PPI twice daily and sucralfate ?  ?Plan: ?- Continue Protonix 40 mg every 12 hours along with sucralfate 1 g 4 times a day  for large gastric ulcers seen on EGD 09/03/2021. ?- Continue fluconazole 100 mg daily for suspected candidiasis noted on EGD. ?- Awaitng liver biopsy results.  ?- Continue daily CBC and transfuse as needed to maintain HGB > 7  ?- [facility] will follow ? ?Charlott Rakes PA-C ?09/10/2021, 10:40 AM ? ?Contact #  [phone]  ?

## 2021-09-10 NOTE — Progress Notes (Signed)
?   ? ? ? ? ?[facility] ? ?Date of Admission:  08/31/2021    ? ?Lines:  ?Peripheral iv's ?  ?Abx: ?4/18-c vancomycin ?4/14-c fluconazole ?                                                             ?  ?  ?Assessment: ?46 yo female substance abuse (denies ivdu -- only snorted heroin), hx roux-n-y gastric bypass distant past, hx left fibular orif distant past, admitted 08/31/21 for a week of feeling ill with left toe redness/pain, left hand/wrist swelling-pain, and black/bloody stool, found to have imaging evidence multiple liver mass/abscesses and cirrhosis, and on liver mass biopsy cx growing staph aureus. Other complication include esophageal candidiasis, and imaging suggestion of portal vein thrombus. ?  ?Id w/u: ?4/18 bcx in progress ?4/17 liver mass/abscess biopsy cx gpc (staph aureus pending susceptibility testing); pathology pending ?4/13 Acute hepatitis panel negative; hiv screen negative ?4/13 egd with evidence suggestive of esophageal candidiasis ?  ?GI involved for cirrhosis w/u and gib -- egd showed no evidence of varices ?  ?  ?#staph aureus in liver ?#left hand/wrist swelling ?#left big toe dry gangrene/redness ?#liver abscesses ?#thoracolumbar spine tenderness ?#hx left fibular orif ?Concerning for endocarditis presentation with septic emboli ?Tender spines will get mri to r/o OM spine and epidural abscesses ?Left wrist/hand swelling/fluctuance concerning for deep tissue abscess will also get mri for that. She'll need orthopedic evaluation ?Her left great toe already is evaluated by vascular sugery. The xray a week ago showed no osseous involvement. Will see how she responds to abx as suspects septic embolus there as well ?Will need at least 6 weeks antibiotics tx ?  ?  ?  ?#cirrhosis -- highly suspected ?#portal vein thrombosis ?#gib ?#thrombocytopenia ?Portal vein alone difficult to account for acute ascites ?Gi following ?4/17 liver biopsy in progress ?4/14 egd showed  ulcer esophageal infection; cta showed no active bleed either ?Ascites not sampled/analyzed yet likely will need this admission ?Given gib and ascites/cirrhosis, probably would benefit from 5 day prophylactic ceftriaxone ?  ? ?As of 4/20: ?Liver biopsy showed infectious changes but no mention of cirrhosis/fibrosis ?4/19 mri t and l spine no evidence infection; pending left hand mri -- however swelling/pain much improved ?The left big toe redness stable/receding and stable necrotic change as well ?4/18 bcx ngtd ? ?  ?  ?#PUD ?Egd 4/13 with jejunal ulcers/esophageal ulcers-candida ?GI following ?On protoniz/sucralfate ? ?As of 4/20: ?Doing cefazolin: no obvious cirrhosis finding on biopsy or actual imaging mentioning ? ?  ?Plan: ?Continue cefazolin ?Continue fluconazole -- increase dose to 200 mg daily; plan 2 weeks tx until 4/30 ?Await hand/wrist mri left side ?Would get tee ?Discussed with primary team ?  ?  ? ?Principal Problem: ?  Acute upper GI bleed ?Active Problems: ?  Essential hypertension ?  Polysubstance abuse ([place]) ?  Acute on chronic blood loss anemia ?  Portal vein thrombosis ?  Lactic acidosis ?  Hepatic lesion ?  Candida infection, esophageal ([place]) ?  Hepatic abscess ?  Acute back pain ?  Localized swelling on left hand ?  Gangrene of toe of left foot ([place]) ?  Staph aureus infection ? ? ?Allergies  ?Allergen Reactions  ? Nsaids Other (See Comments)  ?  Stomach ulcer ?  ? Ketorolac Tromethamine Itching and Nausea And Vomiting  ? ? ?Scheduled Meds: ? amLODipine  10 mg Oral Daily  ? Chlorhexidine Gluconate Cloth  6 each Topical Daily  ? fluconazole  200 mg Oral Daily  ? guaiFENesin  600 mg Oral BID  ? hydrALAZINE  50 mg Oral BID  ? lidocaine  1 patch Transdermal Daily  ? lisinopril  10 mg Oral QHS  ? nicotine  21 mg Transdermal Daily  ? pantoprazole (PROTONIX) IV  40 mg Intravenous Q12H  ? potassium chloride  40 mEq Oral Once  ? sucralfate  1 g Oral TID WC & HS  ? ?Continuous Infusions: ?  ceFAZolin  (ANCEF) IV 2 g (09/10/21 0819)  ? potassium chloride 10 mEq (09/10/21 1627)  ? ?PRN Meds:.acetaminophen **OR** acetaminophen, ALPRAZolam, guaiFENesin-dextromethorphan, HYDROcodone-acetaminophen, HYDROmorphone (DILAUDID) injection, ipratropium-albuterol, labetalol, lip balm, naLOXone (NARCAN)  injection, ondansetron (ZOFRAN) IV ? ? ?SUBJECTIVE: ?Appeared comfortable but then started crying when asked about pain ?Said still painful everywhere ?No n/v/diarrhea ?No headache ?No focal weakness/numbness ? ?Mri reviewed ?Tte reviewed ?Bcx ngtd from 4/18 ? ?Review of Systems: ?ROS ?All other ROS was negative, except mentioned above ? ? ? ? ?OBJECTIVE: ?Vitals:  ? 09/09/21 2007 09/10/21 0500 09/10/21 0605 09/10/21 0816  ?BP: 118/80  113/80 122/87  ?Pulse:   91 98  ?Resp: 20  18   ?Temp: 99.1 ?F (37.3 ?C)  98.2 ?F (36.8 ?C) 98.2 ?F (36.8 ?C)  ?TempSrc: Oral  Oral Oral  ?SpO2:      ?Weight:  64.1 kg    ?Height:      ? ?Body mass index is 20.87 kg/m?. ? ?Physical Exam ?General/constitutional: no distress but then cry asking for pain meds ?HEENT: cachectic; normocephalic; poor dentition; conj clear ?Neck supple ?CV: rrr no mrg ?Lungs: clear to auscultation, normal respiratory effort ?Abd: Soft, Nontender ?Ext: no edema ?Skin: stable left big toe dry gangrene and slight erythema to the mtp joint line ?Neuro: nonfocal ?MSK: no peripheral joint swelling/tenderness/warmth; back spines not examined today ? ? ?Lab Results ?Lab Results  ?Component Value Date  ? WBC 10.7 (H) 09/09/2021  ? HGB 8.6 (L) 09/09/2021  ? HCT 25.4 (L) 09/09/2021  ? MCV 77.4 (L) 09/09/2021  ? PLT 139 (L) 09/09/2021  ?  ?Lab Results  ?Component Value Date  ? CREATININE 0.50 09/10/2021  ? BUN 13 09/10/2021  ? NA 133 (L) 09/10/2021  ? K 3.0 (L) 09/10/2021  ? CL 107 09/10/2021  ? CO2 23 09/10/2021  ?  ?Lab Results  ?Component Value Date  ? ALT 40 09/09/2021  ? AST 35 09/09/2021  ? ALKPHOS 264 (H) 09/09/2021  ? BILITOT 0.3 09/09/2021  ?  ? ? ?Microbiology: ?Recent  Results (from the past 240 hour(s))  ?Aerobic/Anaerobic Culture w Gram Stain (surgical/deep wound)     Status: None (Preliminary result)  ? Collection Time: 09/07/21  3:15 PM  ? Specimen: Abscess  ?Result Value Ref Range Status  ? Specimen Description   Final  ?  ABSCESS ?Performed at [facility], [place] [address], [place], [place] [address] ?  ? Special Requests   Final  ?  Normal ?Performed at [facility], [place] [address], [place], [place] [address] ?  ? Gram Stain   Final  ?  ABUNDANT WBC PRESENT, PREDOMINANTLY PMN ?ABUNDANT GRAM POSITIVE COCCI ?Performed at [facility], [place] [address] [place], [place] [address] ?  ? Culture ABUNDANT STAPHYLOCOCCUS AUREUS  Final  ? Report Status PENDING  Incomplete  ? Organism ID, Bacteria STAPHYLOCOCCUS AUREUS  Final  ?    Susceptibility  ? Staphylococcus aureus - MIC*  ?  CIPROFLOXACIN <=0.5 SENSITIVE Sensitive   ?  ERYTHROMYCIN >=8 RESISTANT Resistant   ?  GENTAMICIN <=0.5 SENSITIVE Sensitive   ?  OXACILLIN <=0.25 SENSITIVE Sensitive   ?  TETRACYCLINE <=1 SENSITIVE Sensitive   ?  VANCOMYCIN <=0.5 SENSITIVE Sensitive   ?  TRIMETH/SULFA <=10 SENSITIVE Sensitive   ?  CLINDAMYCIN <=0.25 SENSITIVE Sensitive   ?  RIFAMPIN <=0.5 SENSITIVE Sensitive   ?  Inducible Clindamycin NEGATIVE Sensitive   ?  * ABUNDANT STAPHYLOCOCCUS AUREUS  ?Culture, blood (Routine X 2) w Reflex to ID Panel     Status: None (Preliminary result)  ? Collection Time: 09/08/21 11:08 AM  ? Specimen: BLOOD  ?Result Value Ref Range Status  ? Specimen Description   Final  ?  BLOOD BLOOD LEFT FOREARM ?Performed at [facility], [place] [address] [place], [place] [address] ?  ? Special Requests IN PEDIATRIC BOTTLE Blood Culture adequate volume  Final  ? Culture   Final  ?  NO GROWTH 2 DAYS ?Performed at [facility], [place] [address], [place],  [address] ?  ? Report Status PENDING  Incomplete  ?Culture, blood (Routine X 2) w Reflex to ID Panel      Status: None (Preliminary result)  ? Collection Time: 09/08/21 11:17 AM  ? Specimen: BLOOD  ?Result Value Ref Range Status  ? Specimen Description   Final  ?  BLOOD LEFT HAND ?Performed at [facility]

## 2021-09-11 ENCOUNTER — Inpatient Hospital Stay (HOSPITAL_COMMUNITY): Payer: Medicaid Other

## 2021-09-11 DIAGNOSIS — K750 Abscess of liver: Secondary | ICD-10-CM | POA: Diagnosis not present

## 2021-09-11 DIAGNOSIS — K922 Gastrointestinal hemorrhage, unspecified: Secondary | ICD-10-CM | POA: Diagnosis not present

## 2021-09-11 DIAGNOSIS — B3781 Candidal esophagitis: Secondary | ICD-10-CM | POA: Diagnosis not present

## 2021-09-11 DIAGNOSIS — A4901 Methicillin susceptible Staphylococcus aureus infection, unspecified site: Secondary | ICD-10-CM | POA: Diagnosis not present

## 2021-09-11 DIAGNOSIS — R188 Other ascites: Secondary | ICD-10-CM

## 2021-09-11 DIAGNOSIS — R9389 Abnormal findings on diagnostic imaging of other specified body structures: Secondary | ICD-10-CM

## 2021-09-11 MED ORDER — POTASSIUM CHLORIDE CRYS ER 20 MEQ PO TBCR: 40.0000 meq | EXTENDED_RELEASE_TABLET | ORAL | Status: DC

## 2021-09-11 MED ORDER — SODIUM CHLORIDE 0.9 % IV SOLN: INTRAVENOUS | Status: DC

## 2021-09-11 MED ORDER — POTASSIUM CHLORIDE CRYS ER 20 MEQ PO TBCR
40.0000 meq | EXTENDED_RELEASE_TABLET | ORAL | Status: AC
  Administered 2021-09-11: 40 meq via ORAL
  Filled 2021-09-11: qty 2

## 2021-09-11 NOTE — Assessment & Plan Note (Deleted)
As abo ?

## 2021-09-11 NOTE — Progress Notes (Signed)
?PROGRESS NOTE ? ? ? ?ANANYA MCCLEESE  YHC:[id number] DOB: 03-24-1976 DOA: 08/31/2021 ?PCP: Pcp, No  ?Chief Complaint  ?Patient presents with  ? Rectal Bleeding  ? ? ?Brief Narrative:  ?46 year old female with history of acute upper GI bleed in setting of peptic ulcer disease, acute lower GI bleeding 2021, essential hypertension, chronic blood loss anemia, polysubstance abuse, portal vein thrombosis was admitted to [facility] for acute GI bleed after presenting from home complaining of dark-colored stool.  Gastroenterology was consulted, EGD on 09/03/2021 showed esophageal plaques consistent with candidiasis, large cratered nonbleeding duodenal ulcer with no stigmata of bleeding.  CT angiogram on 08/31/2021 showed multiple low-attenuation lesions throughout the liver seen on recent prior exam, highly concerning for hepatic abscesses or metastasis. ?  ?Biopsy showed abscess so culture was sent.  Growing Staph aureus.  ? ? ?Assessment & Plan: ?  ?Principal Problem: ?  Acute upper GI bleed ?Active Problems: ?  Acute on chronic blood loss anemia ?  Staph aureus infection ?  Hepatic abscess ?  Swelling of left hand ?  Gangrene of toe of left foot ([place]) ?  Portal vein thrombosis ?  Ascites ?  Hypokalemia ?  Polysubstance abuse ([place]) ?  Essential hypertension ?  Candida infection, esophageal ([place]) ?  Lactic acidosis ?  Hepatic lesion ?  Acute back pain ? ? ?Assessment and Plan: ?* Acute upper GI bleed ?Patient had 2 episodes of melena at home ?Gastroenterology consulted ?Underwent EGD on 09/03/2021 ?EGD 4/13 with esophageal plaques c/w candidiasis, large cratered non bleeding jejunal ulcer with non stigmata of bleeding.  Nodular regions more like regenerative tissue and not like visible vessels. ?GI recommended PPI, add sucralfate, add fluconazole ?GI now signed off, recommending PPI BID x2 months, sucralfate QID x2 weeks (now signed off) ?Will continue to trend Hb, none done today, follow ? ?Staph aureus infection ?Concern  for endocarditis ?Workup underway (see hepatic abscess) ? ?Acute on chronic blood loss anemia ? ? ? ?Hepatic abscess ?CTA abdomen/pelvis showed multiple low-attenuation lesions throughout the liver, highly concerning for hepatic abscesses or metastasis ?MRI liver showed findings concerning for metastases with tumor thrombus (abscesses within ddx) ?IR was consulted for liver biopsy/aspiration ?- bx 4/17 showed acute inflammation with necrosis compatible with abscess, granulomatous inflammation with eosinophilia and fragment of foreign material, negative for malignancy ?- aspiration 4/17 with abundant staph aureus (MSSA) ?ID c/s, appreciate assistance -> given staph in liver, L hand/wrist swelling, left big toe dry gangrene redness 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 -> workup underway  ?4/19 echo with EF 60-65%.  TEE pending at this time for 4/24. ?MRI lumbar and thoracic spine without discitis/osteomyelitis in thoracolumbar spine ?-pain management ?-CA 19-9, CEA, AFP are all unremarkable ?-GI recommends to repeat CT with contrast or MRI of liver with and without contrast in 4 weeks to confirm that liver lesions were abscess and not metastasis ?Continue antibiotics per ID, currently on ancef.  Plan for 6 weeks abx. ? ?Swelling of left hand ?Awaiting MRI ? ?Gangrene of toe of left foot ([place]) ?Seen by Dr. Scot Dock on 4/15, noted dry gangrene on tip of L great toe, palpable pedal pulses, thought to have adequate circulation to heal this (unclear etiology per vascular).  Recommended outpatient follow up, call back as needed. ?Will consider additional imaging prn ? ?Portal vein thrombosis ?With recent GI bleeding, will hold on anticoagulation ?Large burden of thrombus in central portal vein as well as R portal vein and branches ? ? ?Ascites ?2 L removed during  liver bx, but analysis not performed at that point ?Repeat US didn't show enough fluid for repeat para/analysis ? ?Polysubstance abuse ([place]) ?Encourage cessation ?UDS with  cocaine ? ? ? ?Essential hypertension ?Continue lisinopril, amlodipine, hydral ?Labetalol prn ? ?Candida infection, esophageal ([place]) ?"Esophageal plaques were found, consistent with candidiasis" ?-- continue fluconazole ?-- check HIV nonreactive ?-- negative acute hepatitis panel  ? ?Hepatic lesion ? ? ? ? ? ? ?Lactic acidosis ? ? ? ? ?DVT prophylaxis: SCD ?Code Status: full ?Family Communication: none ?Disposition:  ? ?Status is: Inpatient ?Remains inpatient appropriate because: need for additional w/u IV abx ?  ?Consultants:  ?ID ?GI ?IR ?vascular ? ?Procedures:  ?Upper endoscopy ?US guided core liver bx, aspiration of liver lesion for cx, US paracentesis ?Echo ?IMPRESSIONS  ? ? ? 1. Limited echo windows due to body habitus.  ? 2. Left ventricular ejection fraction, by estimation, is 60 to 65%. The  ?left ventricle has normal function. The left ventricle has no regional  ?wall motion abnormalities. Left ventricular diastolic parameters were  ?normal.  ? 3. Right ventricular systolic function is normal. The right ventricular  ?size is normal. There is moderately elevated pulmonary artery systolic  ?pressure.  ? 4. Fritz Cauthon small pericardial effusion is present. The pericardial effusion is  ?posterior to the left ventricle and anterior to the right ventricle.  ? 5. The mitral valve is abnormal. No evidence of mitral valve  ?regurgitation. No evidence of mitral stenosis. Moderate mitral annular  ?calcification.  ? 6. The aortic valve is normal in structure. There is mild calcification  ?of the aortic valve. There is mild thickening of the aortic valve. Aortic  ?valve regurgitation is trivial. Aortic valve sclerosis is present, with no  ?evidence of aortic valve  ?stenosis.  ? 7. The inferior vena cava is normal in size with greater than 50%  ?respiratory variability, suggesting right atrial pressure of 3 mmHg.  ? ?ABI ?Summary:  ?Right: Resting right ankle-brachial index is within normal range. No  ?evidence of significant  right lower extremity arterial disease. The right  ?toe-brachial index is abnormal.  ? ?Left: Resting left ankle-brachial index is within normal range. No  ?evidence of significant left lower extremity arterial disease.  ? ?Antimicrobials:  ?Anti-infectives (From admission, onward)  ? ? Start     Dose/Rate Route Frequency Ordered Stop  ? 09/09/21 1500  ceFAZolin (ANCEF) IVPB 2g/100 mL premix       ? 2 g ?200 mL/hr over 30 Minutes Intravenous Every 8 hours 09/09/21 1407    ? 09/09/21 1000  fluconazole (DIFLUCAN) tablet 200 mg       ? 200 mg Oral Daily 09/08/21 1504 09/21/21 0959  ? 09/08/21 2300  vancomycin (VANCOREADY) IVPB 750 mg/150 mL  Status:  Discontinued       ? 750 mg ?150 mL/hr over 60 Minutes Intravenous Every 12 hours 09/08/21 1008 09/09/21 1407  ? 09/08/21 1600  cefTRIAXone (ROCEPHIN) 1 g in sodium chloride 0.9 % 100 mL IVPB  Status:  Discontinued       ? 1 g ?200 mL/hr over 30 Minutes Intravenous Every 24 hours 09/08/21 1513 09/09/21 1407  ? 09/08/21 1100  vancomycin (VANCOREADY) IVPB 1250 mg/250 mL       ? 1,250 mg ?166.7 mL/hr over 90 Minutes Intravenous  Once 09/08/21 1005 09/08/21 1230  ? 09/04/21 1000  fluconazole (DIFLUCAN) tablet 100 mg  Status:  Discontinued       ? 100 mg Oral Daily 09/03/21 1334 09/08/21  1504  ? 09/03/21 1500  fluconazole (DIFLUCAN) IVPB 200 mg       ? 200 mg ?100 mL/hr over 60 Minutes Intravenous  Once 09/03/21 1334 09/03/21 1637  ? 08/31/21 2030  cefTRIAXone (ROCEPHIN) 1 g in sodium chloride 0.9 % 100 mL IVPB       ? 1 g ?200 mL/hr over 30 Minutes Intravenous  Once 08/31/21 2026 08/31/21 2242  ? ?  ? ? ?Subjective: ?No new complaints ?Continued abdominal pain ?Pain in L first toe, L wrist ? ?Objective: ?Vitals:  ? 09/10/21 2000 09/11/21 0500 09/11/21 1301 09/11/21 2000  ?BP: 126/85 110/86  126/86  ?Pulse: 92 83    ?Resp: '13 18  20  '$ ?Temp: 98.4 ?F (36.9 ?C) 97.6 ?F (36.4 ?C) 97.7 ?F (36.5 ?C) 98.9 ?F (37.2 ?C)  ?TempSrc: Oral Oral Oral Oral  ?SpO2:    94%  ?Weight:  63 kg     ?Height:      ? ? ?Intake/Output Summary (Last 24 hours) at 09/11/2021 2112 ?Last data filed at 09/11/2021 1700 ?Gross per 24 hour  ?Intake 843.34 ml  ?Output 700 ml  ?Net 143.34 ml  ? ?Filed Weights  ? 04/1

## 2021-09-11 NOTE — Assessment & Plan Note (Addendum)
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 MRI lumbar and thoracic spine without discitis/osteomyelitis in thoracolumbar spine ?--repeat CT with contrast or MRI of liver with and without contrast in 4 weeks to confirm that liver lesions were abscess and not metastasis ?

## 2021-09-11 NOTE — Progress Notes (Signed)
?   ? ? ? ? ?[facility] ? ?Date of Admission:  08/31/2021    ? ?Lines:  ?Peripheral iv's ?  ?Abx: ?4/18-c vancomycin ?4/14-c fluconazole ?                                                             ?  ?  ?Assessment: ?46 yo female substance abuse (denies ivdu -- only snorted heroin), hx roux-n-y gastric bypass distant past, hx left fibular orif distant past, admitted 08/31/21 for a week of feeling ill with left toe redness/pain, left hand/wrist swelling-pain, and black/bloody stool, found to have imaging evidence multiple liver mass/abscesses and cirrhosis, and on liver mass biopsy cx growing staph aureus. Other complication include esophageal candidiasis, and imaging suggestion of portal vein thrombus. ?  ?Id w/u: ?4/18 bcx ngtd ?4/17 liver mass/abscess biopsy cx mssa; pathology mentioned abscesses but no cirrhosis ?4/13 Acute hepatitis panel negative; hiv screen negative ?4/13 egd with evidence suggestive of esophageal candidiasis ?  ?GI involved for cirrhosis w/u and gib -- egd showed no evidence of varices ?  ?  ?#staph aureus in liver ?#left hand/wrist swelling ?#left big toe dry gangrene/redness ?#liver abscesses ?#thoracolumbar spine tenderness ?#hx left fibular orif ?Concerning for endocarditis presentation with septic emboli ?Tender spines will get mri to r/o OM spine and epidural abscesses ?Left wrist/hand swelling/fluctuance concerning for deep tissue abscess will also get mri for that. She'll need orthopedic evaluation ?Her left great toe already is evaluated by vascular sugery. The xray a week ago showed no osseous involvement. Will see how she responds to abx as suspects septic embolus there as well ?Will need at least 6 weeks antibiotics tx ?  ?  ?  ?#cirrhosis -- highly suspected ?#portal vein thrombosis ?#gib ?#thrombocytopenia ?Portal vein alone difficult to account for acute ascites ?Gi following ?4/17 liver biopsy c/w abscess without cirrhosis mention ?4/14 egd showed  ulcer esophageal infection; cta showed no active bleed either ?S/p lvp but not cultured. Repeat u/s showed no more ascites to diagnose ?4/19 mri t and l spine no evidence infection; pending left hand mri -- however swelling/pain much improved ?The left big toe redness stable/receding and stable necrotic change as well ?4/18 bcx ngtd ?4/19 tte no obvious vegetation -- deferring tee as bcx negative and tx will be at least 6 weeks ? ?4/22: ?Clinically improving in terms of pain/labs and eating better ?Continue cefazolin ? ?  ?#esophageal candidiasis ?Due to malnutrition ?Will finish 2 weeks fluconazole 200 mg by 4/30 ? ?  ?#PUD ?Egd 4/13 with jejunal ulcers/esophageal ulcers-candida ?GI following ?On protoniz/sucralfate ? ? ? ?  ?Plan: ?Continue cefazolin ?Continue fluconazole 200 mg daily for 2 weeks tx until 4/30 ?Wrist left side swelling much better; ok to defer mri ?Discussed with primary team ?  ?  ? ?Principal Problem: ?  Acute upper GI bleed ?Active Problems: ?  Essential hypertension ?  Polysubstance abuse ([place]) ?  Acute on chronic blood loss anemia ?  Portal vein thrombosis ?  Lactic acidosis ?  Hepatic lesion ?  Candida infection, esophageal ([place]) ?  Hepatic abscess ?  Acute back pain ?  Localized swelling on left hand ?  Gangrene of toe of left foot ([place]) ?  Staph aureus infection ? ? ?Allergies  ?Allergen  Reactions  ? Nsaids Other (See Comments)  ?  Stomach ulcer ?  ? Ketorolac Tromethamine Itching and Nausea And Vomiting  ? ? ?Scheduled Meds: ? amLODipine  10 mg Oral Daily  ? Chlorhexidine Gluconate Cloth  6 each Topical Daily  ? fluconazole  200 mg Oral Daily  ? guaiFENesin  600 mg Oral BID  ? hydrALAZINE  50 mg Oral BID  ? lidocaine  1 patch Transdermal Daily  ? lisinopril  10 mg Oral QHS  ? nicotine  21 mg Transdermal Daily  ? pantoprazole (PROTONIX) IV  40 mg Intravenous Q12H  ? potassium chloride SA  40 mEq Oral Q4H  ? sucralfate  1 g Oral TID WC & HS  ? ?Continuous Infusions: ?  ceFAZolin (ANCEF) IV  2 g (09/11/21 9622)  ? ?PRN Meds:.acetaminophen **OR** acetaminophen, ALPRAZolam, guaiFENesin-dextromethorphan, HYDROcodone-acetaminophen, HYDROmorphone (DILAUDID) injection, ipratropium-albuterol, labetalol, lip balm, naLOXone (NARCAN)  injection, ondansetron (ZOFRAN) IV ? ? ?SUBJECTIVE: ?No complaint ?No n/v/diarrhea ?No fever/chill ?Wbc improving ?Pain in wrist imroving ?Not complaining of pain other places today ? ?Review of Systems: ?ROS ?All other ROS was negative, except mentioned above ? ? ? ? ?OBJECTIVE: ?Vitals:  ? 09/10/21 0605 09/10/21 0816 09/10/21 2000 09/11/21 0500  ?BP: 113/80 122/87 126/85 110/86  ?Pulse: 91 98 92 83  ?Resp: '18  13 18  '$ ?Temp: 98.2 ?F (36.8 ?C) 98.2 ?F (36.8 ?C) 98.4 ?F (36.9 ?C) 97.6 ?F (36.4 ?C)  ?TempSrc: Oral Oral Oral Oral  ?SpO2:      ?Weight:    63 kg  ?Height:      ? ?Body mass index is 20.51 kg/m?. ? ?Physical Exam ?General/constitutional: no distress, pleasant ?HEENT: Normocephalic, PER, Conj Clear, EOMI, Oropharynx clear ?Neck supple ?CV: rrr no mrg ?Lungs: clear to auscultation, normal respiratory effort ?Abd: Soft, Nontender ?Ext: no edema ? ?Skin: stable left big toe dry gangrene and improving slight erythema to the mtp joint line ?MSK: left hand/wrist mild swelling (much improved) and wrist rom also much improved and less tender; back spines not examined today ? ? ?Lab Results ?Lab Results  ?Component Value Date  ? WBC 10.7 (H) 09/09/2021  ? HGB 8.6 (L) 09/09/2021  ? HCT 25.4 (L) 09/09/2021  ? MCV 77.4 (L) 09/09/2021  ? PLT 139 (L) 09/09/2021  ?  ?Lab Results  ?Component Value Date  ? CREATININE 0.50 09/10/2021  ? BUN 13 09/10/2021  ? NA 133 (L) 09/10/2021  ? K 3.0 (L) 09/10/2021  ? CL 107 09/10/2021  ? CO2 23 09/10/2021  ?  ?Lab Results  ?Component Value Date  ? ALT 40 09/09/2021  ? AST 35 09/09/2021  ? ALKPHOS 264 (H) 09/09/2021  ? BILITOT 0.3 09/09/2021  ?  ? ? ?Microbiology: ?Recent Results (from the past 240 hour(s))  ?Aerobic/Anaerobic Culture w Gram Stain  (surgical/deep wound)     Status: None (Preliminary result)  ? Collection Time: 09/07/21  3:15 PM  ? Specimen: Abscess  ?Result Value Ref Range Status  ? Specimen Description   Final  ?  ABSCESS ?Performed at [facility], [place] [address] [place], [place] [address] ?  ? Special Requests   Final  ?  Normal ?Performed at [facility], [place] [address] [place], [place] [address] ?  ? Gram Stain   Final  ?  ABUNDANT WBC PRESENT, PREDOMINANTLY PMN ?ABUNDANT GRAM POSITIVE COCCI ?Performed at [facility], [place] [address] [place], [place] [address] ?  ? Culture ABUNDANT STAPHYLOCOCCUS AUREUS  Final  ? Report Status PENDING  Incomplete  ? Organism ID, Bacteria STAPHYLOCOCCUS AUREUS  Final  ?    Susceptibility  ? Staphylococcus aureus - MIC*  ?  CIPROFLOXACIN <=0.5 SENSITIVE Sensitive   ?  ERYTHROMYCIN >=8 RESISTANT Resistant   ?  GENTAMICIN <=0.5 SENSITIVE Sensitive   ?  OXACILLIN <=0.25 SENSITIVE Sensitive   ?  TETRACYCLINE <=1 SENSITIVE Sensitive   ?  VANCOMYCIN <=0.5 SENSITIVE Sensitive   ?  TRIMETH/SULFA <=10 SENSITIVE Sensitive   ?  CLINDAMYCIN <=0.25 SENSITIVE Sensitive   ?  RIFAMPIN <=0.5 SENSITIVE Sensitive   ?  Inducible Clindamycin NEGATIVE Sensitive   ?  * ABUNDANT STAPHYLOCOCCUS AUREUS  ?Culture, blood (Routine X 2) w Reflex to ID Panel     Status: None (Preliminary result)  ? Collection Time: 09/08/21 11:08 AM  ? Specimen: BLOOD  ?Result Value Ref Range Status  ? Specimen Description   Final  ?  BLOOD BLOOD LEFT FOREARM ?Performed at [facility], [place] [address] [place], [place] [address] ?  ? Special Requests IN PEDIATRIC BOTTLE Blood Culture adequate volume  Final  ? Culture   Final  ?  NO GROWTH 3 DAYS ?Performed at [facility], [place] [address], [place], [place] [address] ?  ? Report Status PENDING  Incomplete  ?Culture, blood (Routine X 2) w Reflex to ID Panel     Status: None (Preliminary result)  ? Collection Time: 09/08/21 11:17 AM  ?  Specimen: BLOOD  ?Result Value Ref Range Status  ? Specimen Description   Final  ?  BLOOD LEFT HAND ?Performed at [facility], [place] [address], [place], [place] [address] ?  ? Special Requ

## 2021-09-11 NOTE — Assessment & Plan Note (Deleted)
CTA abdomen/pelvis showed multiple low-attenuation lesions throughout the liver, highly concerning for hepatic abscesses or metastasis ?MRI liver showed findings concerning for metastases with tumor thrombus (abscesses within ddx) ?IR was consulted for liver biopsy/aspiration ?- bx 4/17 showed acute inflammation with necrosis compatible with abscess, granulomatous inflammation with eosinophilia and fragment of foreign material, negative for malignancy ?- aspiration 4/17 with abundant staph aureus (MSSA), abundant bacteroides oralis ?ID c/s, appreciate assistance -> given staph in liver, L hand/wrist swelling, left big toe dry gangrene redness 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 -> workup underway  ?4/19 echo with EF 60-65%.  TEE pending at this time (deferred with resp status at this time, on 6 L). ?MRI lumbar and thoracic spine without discitis/osteomyelitis in thoracolumbar spine ?-pain management ?-CA 19-9, CEA, AFP are all unremarkable ?-GI recommends to repeat CT with contrast or MRI of liver with and without contrast in 4 weeks to confirm that liver lesions were abscess and not metastasis ?Continue antibiotics per ID, currently on ancef, adding flagyl.  Plan for 6 weeks abx. ?

## 2021-09-11 NOTE — Plan of Care (Signed)

## 2021-09-11 NOTE — Assessment & Plan Note (Addendum)
--  Seen by Dr. Scot Dock on 4/15, noted dry gangrene on tip of L great toe, palpable pedal pulses, thought to have adequate circulation to heal this (unclear etiology per vascular).   ?--now s/p amputation by orthopedics ?

## 2021-09-11 NOTE — Progress Notes (Signed)
? ?  Shared Decision Making/Informed Consent ? ?The risks [esophageal damage, perforation (1:10,000 risk), bleeding, pharyngeal hematoma as well as other potential complications associated with conscious sedation including aspiration, arrhythmia, respiratory failure and death], benefits (treatment guidance and diagnostic support) and alternatives of a transesophageal echocardiogram were discussed in detail with Terri [name] and she is willing to proceed.   ? ?TEE scheduled on Monday 09/14/21, NPO after midnight prior to 09/14/21, see orders ; please replete for hypokalemia.  ? ?Note upper GI bleed with recent EGD 09/03/21 showing esophageal candidiasis and large cratered non-bleeding jejunal ulcer with no stigmata of bleeding, stable Hgb 8-9 currently; hepatic abscess without cirrhosis per liver biopsy; thrombocytopenia improving, INR 1.2 from 09/06/21.  ?

## 2021-09-11 NOTE — Assessment & Plan Note (Addendum)
--  MRI limited, no fluid collection or tenosynovitis seen ?

## 2021-09-11 NOTE — Assessment & Plan Note (Addendum)
--  2 L removed during liver bx, but analysis not performed. Repeat US didn't show enough fluid for repeat paracentesis ?

## 2021-09-12 ENCOUNTER — Inpatient Hospital Stay (HOSPITAL_COMMUNITY): Payer: Medicaid Other

## 2021-09-12 DIAGNOSIS — J90 Pleural effusion, not elsewhere classified: Secondary | ICD-10-CM | POA: Insufficient documentation

## 2021-09-12 DIAGNOSIS — K922 Gastrointestinal hemorrhage, unspecified: Secondary | ICD-10-CM | POA: Diagnosis not present

## 2021-09-12 LAB — COMPREHENSIVE METABOLIC PANEL
ALT: 13 U/L (ref 0–44)
AST: 16 U/L (ref 15–41)
Albumin: <1.5 g/dL — ABNORMAL LOW (ref 3.5–5.0)
Alkaline Phosphatase: 197 U/L — ABNORMAL HIGH (ref 38–126)
Anion gap: 4 — ABNORMAL LOW (ref 5–15)
BUN: 10 mg/dL (ref 6–20)
CO2: 24 mmol/L (ref 22–32)
Calcium: 7.1 mg/dL — ABNORMAL LOW (ref 8.9–10.3)
Chloride: 105 mmol/L (ref 98–111)
Creatinine, Ser: 0.36 mg/dL — ABNORMAL LOW (ref 0.44–1.00)
GFR, Estimated: >60 mL/min (ref >60)
Glucose, Bld: 124 mg/dL — ABNORMAL HIGH (ref 70–99)
Potassium: 2.8 mmol/L — ABNORMAL LOW (ref 3.5–5.1)
Sodium: 133 mmol/L — ABNORMAL LOW (ref 135–145)
Total Bilirubin: 0.4 mg/dL (ref 0.3–1.2)
Total Protein: 4.7 g/dL — ABNORMAL LOW (ref 6.5–8.1)

## 2021-09-12 LAB — AEROBIC/ANAEROBIC CULTURE W GRAM STAIN (SURGICAL/DEEP WOUND): Special Requests: NORMAL

## 2021-09-12 LAB — CBC WITH DIFFERENTIAL/PLATELET
Abs Immature Granulocytes: 0.05 10*3/uL (ref 0.00–0.07)
Basophils Absolute: 0.1 10*3/uL (ref 0.0–0.1)
Basophils Relative: 1 %
Eosinophils Absolute: 0.1 10*3/uL (ref 0.0–0.5)
Eosinophils Relative: 1 %
HCT: 26.1 % — ABNORMAL LOW (ref 36.0–46.0)
Hemoglobin: 8.6 g/dL — ABNORMAL LOW (ref 12.0–15.0)
Immature Granulocytes: 1 %
Lymphocytes Relative: 11 %
Lymphs Abs: 1.2 10*3/uL (ref 0.7–4.0)
MCH: 25.4 pg — ABNORMAL LOW (ref 26.0–34.0)
MCHC: 33 g/dL (ref 30.0–36.0)
MCV: 77.2 fL — ABNORMAL LOW (ref 80.0–100.0)
Monocytes Absolute: 0.5 10*3/uL (ref 0.1–1.0)
Monocytes Relative: 5 %
Neutro Abs: 9 10*3/uL — ABNORMAL HIGH (ref 1.7–7.7)
Neutrophils Relative %: 81 %
Platelets: 151 10*3/uL (ref 150–400)
RBC: 3.38 MIL/uL — ABNORMAL LOW (ref 3.87–5.11)
RDW: 23.5 % — ABNORMAL HIGH (ref 11.5–15.5)
WBC: 10.9 10*3/uL — ABNORMAL HIGH (ref 4.0–10.5)
nRBC: 0 % (ref 0.0–0.2)

## 2021-09-12 LAB — HEMOGLOBIN A1C
Hgb A1c MFr Bld: 5.2 % (ref 4.8–5.6)
Mean Plasma Glucose: 102.54 mg/dL

## 2021-09-12 LAB — MAGNESIUM: Magnesium: 1.7 mg/dL (ref 1.7–2.4)

## 2021-09-12 LAB — POTASSIUM: Potassium: 3.1 mmol/L — ABNORMAL LOW (ref 3.5–5.1)

## 2021-09-12 LAB — PHOSPHORUS: Phosphorus: 1.5 mg/dL — ABNORMAL LOW (ref 2.5–4.6)

## 2021-09-12 MED ORDER — POTASSIUM CHLORIDE CRYS ER 20 MEQ PO TBCR
40.0000 meq | EXTENDED_RELEASE_TABLET | ORAL | Status: AC
  Administered 2021-09-12 (×3): 40 meq via ORAL
  Filled 2021-09-12 (×3): qty 2

## 2021-09-12 MED ORDER — GADOBUTROL 1 MMOL/ML IV SOLN
6.0000 mL | Freq: Once | INTRAVENOUS | Status: AC | PRN
  Administered 2021-09-12: 6 mL via INTRAVENOUS

## 2021-09-12 MED ORDER — K PHOS MONO-SOD PHOS DI & MONO 155-852-130 MG PO TABS
500.0000 mg | ORAL_TABLET | Freq: Four times a day (QID) | ORAL | Status: AC
  Administered 2021-09-12 – 2021-09-13 (×6): 500 mg via ORAL
  Filled 2021-09-12 (×8): qty 2

## 2021-09-12 MED ORDER — LORAZEPAM 2 MG/ML IJ SOLN
1.0000 mg | Freq: Once | INTRAMUSCULAR | Status: AC | PRN
  Administered 2021-09-12: 1 mg via INTRAVENOUS
  Filled 2021-09-12: qty 1

## 2021-09-12 MED ORDER — MAGNESIUM SULFATE 2 GM/50ML IV SOLN
2.0000 g | Freq: Once | INTRAVENOUS | Status: AC
  Administered 2021-09-12: 2 g via INTRAVENOUS
  Filled 2021-09-12: qty 50

## 2021-09-12 MED ORDER — BISACODYL 10 MG RE SUPP
10.0000 mg | Freq: Once | RECTAL | Status: AC
  Administered 2021-09-12: 10 mg via RECTAL
  Filled 2021-09-12: qty 1

## 2021-09-12 NOTE — Assessment & Plan Note (Addendum)
--  s/p thoracentesis 4/23 with 1.2 L of turbid light yellow fluid; post thora CXR with severe multilobar bilateral pneumonia, most pronounced in R mid lung, needs f/u PA/lateral in 3-4 weeks following abx therapy to ensure resolution ?

## 2021-09-12 NOTE — Assessment & Plan Note (Addendum)
--  replete as needed 

## 2021-09-12 NOTE — Progress Notes (Signed)
?PROGRESS NOTE ? ? ? ?Terri [name]  FXT:[id number] DOB: 1975-06-24 DOA: 08/31/2021 ?PCP: Pcp, No  ?Chief Complaint  ?Patient presents with  ? Rectal Bleeding  ? ? ?Brief Narrative:  ?46 year old female with history of acute upper GI bleed in setting of peptic ulcer disease, acute lower GI bleeding 2021, essential hypertension, chronic blood loss anemia, polysubstance abuse, portal vein thrombosis was admitted to [facility] for acute GI bleed after presenting from home complaining of dark-colored stool.  Gastroenterology was consulted, EGD on 09/03/2021 showed esophageal plaques consistent with candidiasis, large cratered nonbleeding duodenal ulcer with no stigmata of bleeding.  CT angiogram on 08/31/2021 showed multiple low-attenuation lesions throughout the liver seen on recent prior exam, highly concerning for hepatic abscesses or metastasis. ?  ?Biopsy showed abscess so culture was sent.  Growing Staph aureus.  ? ? ?Assessment & Plan: ?  ?Principal Problem: ?  Acute upper GI bleed ?Active Problems: ?  Acute on chronic blood loss anemia ?  Staph aureus infection ?  Hepatic abscess ?  Swelling of left hand ?  Gangrene of toe of left foot ([place]) ?  Portal vein thrombosis ?  Pleural effusion ?  Ascites ?  Hypokalemia ?  Polysubstance abuse ([place]) ?  Hypophosphatemia ?  Essential hypertension ?  Candida infection, esophageal ([place]) ?  Lactic acidosis ?  Hepatic lesion ?  Acute back pain ? ? ?Assessment and Plan: ?* Acute upper GI bleed ?Patient had 2 episodes of melena at home ?Gastroenterology consulted ?Underwent EGD on 09/03/2021 ?EGD 4/13 with esophageal plaques c/w candidiasis, large cratered non bleeding jejunal ulcer with non stigmata of bleeding.  Nodular regions more like regenerative tissue and not like visible vessels. ?GI recommended PPI, add sucralfate, add fluconazole ?GI now signed off, recommending PPI BID x2 months, sucralfate QID x2 weeks (now signed off) ?Will continue to trend Hb, stable ? ?Staph  aureus infection ?Concern for endocarditis ?Workup underway (see hepatic abscess) ? ?Acute on chronic blood loss anemia ? ? ? ?Hepatic abscess ?CTA abdomen/pelvis showed multiple low-attenuation lesions throughout the liver, highly concerning for hepatic abscesses or metastasis ?MRI liver showed findings concerning for metastases with tumor thrombus (abscesses within ddx) ?IR was consulted for liver biopsy/aspiration ?- bx 4/17 showed acute inflammation with necrosis compatible with abscess, granulomatous inflammation with eosinophilia and fragment of foreign material, negative for malignancy ?- aspiration 4/17 with abundant staph aureus (MSSA) ?ID c/s, appreciate assistance -> given staph in liver, L hand/wrist swelling, left big toe dry gangrene redness 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 -> workup underway  ?4/19 echo with EF 60-65%.  TEE pending at this time for 4/24. ?MRI lumbar and thoracic spine without discitis/osteomyelitis in thoracolumbar spine ?-pain management ?-CA 19-9, CEA, AFP are all unremarkable ?-GI recommends to repeat CT with contrast or MRI of liver with and without contrast in 4 weeks to confirm that liver lesions were abscess and not metastasis ?Continue antibiotics per ID, currently on ancef.  Plan for 6 weeks abx. ? ?Swelling of left hand ?Awaiting MRI ? ?Gangrene of toe of left foot ([place]) ?Seen by Dr. Scot Dock on 4/15, noted dry gangrene on tip of L great toe, palpable pedal pulses, thought to have adequate circulation to heal this (unclear etiology per vascular).  Recommended outpatient follow up, call back as needed. ?Will consider additional imaging prn ? ?Pleural effusion ?CXR 4/22 with layering R effusion and R mid and lower lung infiltrate vs atelectasis ?Follow thoracentesis ? ?Portal vein thrombosis ?With recent GI bleeding, will hold on  anticoagulation ?Large burden of thrombus in central portal vein as well as R portal vein and branches ? ? ?Ascites ?2 L removed during liver bx, but analysis  not performed at that point ?Repeat US didn't show enough fluid for repeat para/analysis ? ?Hypophosphatemia ?Replace and follow ? ?Polysubstance abuse ([place]) ?Encourage cessation ?UDS with cocaine ? ? ? ?Hypokalemia ?Replace aggressively and follow ? ?Essential hypertension ?Continue lisinopril, amlodipine, hydral ?Labetalol prn ? ?Candida infection, esophageal ([place]) ?"Esophageal plaques were found, consistent with candidiasis" ?-- continue fluconazole ?-- check HIV nonreactive ?-- negative acute hepatitis panel  ? ?Hepatic lesion ? ? ? ? ? ? ?Lactic acidosis ? ? ? ? ?DVT prophylaxis: SCD ?Code Status: full ?Family Communication: none ?Disposition:  ? ?Status is: Inpatient ?Remains inpatient appropriate because: need for additional w/u IV abx ?  ?Consultants:  ?ID ?GI ?IR ?vascular ? ?Procedures:  ?Upper endoscopy ?US guided core liver bx, aspiration of liver lesion for cx, US paracentesis ?Echo ?IMPRESSIONS  ? ? ? 1. Limited echo windows due to body habitus.  ? 2. Left ventricular ejection fraction, by estimation, is 60 to 65%. The  ?left ventricle has normal function. The left ventricle has no regional  ?wall motion abnormalities. Left ventricular diastolic parameters were  ?normal.  ? 3. Right ventricular systolic function is normal. The right ventricular  ?size is normal. There is moderately elevated pulmonary artery systolic  ?pressure.  ? 4. Terri [name] small pericardial effusion is present. The pericardial effusion is  ?posterior to the left ventricle and anterior to the right ventricle.  ? 5. The mitral valve is abnormal. No evidence of mitral valve  ?regurgitation. No evidence of mitral stenosis. Moderate mitral annular  ?calcification.  ? 6. The aortic valve is normal in structure. There is mild calcification  ?of the aortic valve. There is mild thickening of the aortic valve. Aortic  ?valve regurgitation is trivial. Aortic valve sclerosis is present, with no  ?evidence of aortic valve  ?stenosis.  ? 7. The inferior  vena cava is normal in size with greater than 50%  ?respiratory variability, suggesting right atrial pressure of 3 mmHg.  ? ?ABI ?Summary:  ?Right: Resting right ankle-brachial index is within normal range. No  ?evidence of significant right lower extremity arterial disease. The right  ?toe-brachial index is abnormal.  ? ?Left: Resting left ankle-brachial index is within normal range. No  ?evidence of significant left lower extremity arterial disease.  ? ?Antimicrobials:  ?Anti-infectives (From admission, onward)  ? ? Start     Dose/Rate Route Frequency Ordered Stop  ? 09/09/21 1500  ceFAZolin (ANCEF) IVPB 2g/100 mL premix       ? 2 g ?200 mL/hr over 30 Minutes Intravenous Every 8 hours 09/09/21 1407    ? 09/09/21 1000  fluconazole (DIFLUCAN) tablet 200 mg       ? 200 mg Oral Daily 09/08/21 1504 09/21/21 0959  ? 09/08/21 2300  vancomycin (VANCOREADY) IVPB 750 mg/150 mL  Status:  Discontinued       ? 750 mg ?150 mL/hr over 60 Minutes Intravenous Every 12 hours 09/08/21 1008 09/09/21 1407  ? 09/08/21 1600  cefTRIAXone (ROCEPHIN) 1 g in sodium chloride 0.9 % 100 mL IVPB  Status:  Discontinued       ? 1 g ?200 mL/hr over 30 Minutes Intravenous Every 24 hours 09/08/21 1513 09/09/21 1407  ? 09/08/21 1100  vancomycin (VANCOREADY) IVPB 1250 mg/250 mL       ? 1,250 mg ?166.7 mL/hr over 90 Minutes  Intravenous  Once 09/08/21 1005 09/08/21 1230  ? 09/04/21 1000  fluconazole (DIFLUCAN) tablet 100 mg  Status:  Discontinued       ? 100 mg Oral Daily 09/03/21 1334 09/08/21 1504  ? 09/03/21 1500  fluconazole (DIFLUCAN) IVPB 200 mg       ? 200 mg ?100 mL/hr over 60 Minutes Intravenous  Once 09/03/21 1334 09/03/21 1637  ? 08/31/21 2030  cefTRIAXone (ROCEPHIN) 1 g in sodium chloride 0.9 % 100 mL IVPB       ? 1 g ?200 mL/hr over 30 Minutes Intravenous  Once 08/31/21 2026 08/31/21 2242  ? ?  ? ? ?Subjective: ?No new complaints ?Conttinued pain and discomfort ? ?Objective: ?Vitals:  ? 09/11/21 0500 09/11/21 1301 09/11/21 2000 09/12/21  0604  ?BP: 110/86  126/86 119/89  ?Pulse: 83   94  ?Resp: '18  20 17  '$ ?Temp: 97.6 ?F (36.4 ?C) 97.7 ?F (36.5 ?C) 98.9 ?F (37.2 ?C) 98.7 ?F (37.1 ?C)  ?TempSrc: Oral Oral Oral Oral  ?SpO2:   94% 97%  ?Weigh

## 2021-09-12 NOTE — Assessment & Plan Note (Addendum)
-  replete °

## 2021-09-13 ENCOUNTER — Inpatient Hospital Stay (HOSPITAL_COMMUNITY): Payer: Medicaid Other

## 2021-09-13 DIAGNOSIS — K922 Gastrointestinal hemorrhage, unspecified: Secondary | ICD-10-CM | POA: Diagnosis not present

## 2021-09-13 DIAGNOSIS — R609 Edema, unspecified: Secondary | ICD-10-CM

## 2021-09-13 LAB — CBC WITH DIFFERENTIAL/PLATELET
Abs Immature Granulocytes: 0.03 10*3/uL (ref 0.00–0.07)
Basophils Absolute: 0.1 10*3/uL (ref 0.0–0.1)
Basophils Relative: 0 %
Eosinophils Absolute: 0.1 10*3/uL (ref 0.0–0.5)
Eosinophils Relative: 1 %
HCT: 31.2 % — ABNORMAL LOW (ref 36.0–46.0)
Hemoglobin: 10.4 g/dL — ABNORMAL LOW (ref 12.0–15.0)
Immature Granulocytes: 0 %
Lymphocytes Relative: 8 %
Lymphs Abs: 0.9 10*3/uL (ref 0.7–4.0)
MCH: 25.2 pg — ABNORMAL LOW (ref 26.0–34.0)
MCHC: 33.3 g/dL (ref 30.0–36.0)
MCV: 75.5 fL — ABNORMAL LOW (ref 80.0–100.0)
Monocytes Absolute: 0.5 10*3/uL (ref 0.1–1.0)
Monocytes Relative: 4 %
Neutro Abs: 10.1 10*3/uL — ABNORMAL HIGH (ref 1.7–7.7)
Neutrophils Relative %: 87 %
Platelets: 130 10*3/uL — ABNORMAL LOW (ref 150–400)
RBC: 4.13 MIL/uL (ref 3.87–5.11)
RDW: 23.6 % — ABNORMAL HIGH (ref 11.5–15.5)
WBC: 11.6 10*3/uL — ABNORMAL HIGH (ref 4.0–10.5)
nRBC: 0 % (ref 0.0–0.2)

## 2021-09-13 LAB — COMPREHENSIVE METABOLIC PANEL
ALT: 9 U/L (ref 0–44)
AST: 18 U/L (ref 15–41)
Albumin: <1.5 g/dL — ABNORMAL LOW (ref 3.5–5.0)
Alkaline Phosphatase: 180 U/L — ABNORMAL HIGH (ref 38–126)
Anion gap: 5 (ref 5–15)
BUN: 11 mg/dL (ref 6–20)
CO2: 23 mmol/L (ref 22–32)
Calcium: 7.1 mg/dL — ABNORMAL LOW (ref 8.9–10.3)
Chloride: 104 mmol/L (ref 98–111)
Creatinine, Ser: 0.39 mg/dL — ABNORMAL LOW (ref 0.44–1.00)
GFR, Estimated: >60 mL/min (ref >60)
Glucose, Bld: 84 mg/dL (ref 70–99)
Potassium: 3.6 mmol/L (ref 3.5–5.1)
Sodium: 132 mmol/L — ABNORMAL LOW (ref 135–145)
Total Bilirubin: 0.4 mg/dL (ref 0.3–1.2)
Total Protein: 4.4 g/dL — ABNORMAL LOW (ref 6.5–8.1)

## 2021-09-13 LAB — BODY FLUID CELL COUNT WITH DIFFERENTIAL
Lymphs, Fluid: 8 %
Monocyte-Macrophage-Serous Fluid: 33 % — ABNORMAL LOW (ref 50–90)
Neutrophil Count, Fluid: 59 % — ABNORMAL HIGH (ref 0–25)
Total Nucleated Cell Count, Fluid: 800 cu mm (ref 0–1000)

## 2021-09-13 LAB — CULTURE, BLOOD (ROUTINE X 2)
Culture: NO GROWTH
Culture: NO GROWTH
Special Requests: ADEQUATE
Special Requests: ADEQUATE

## 2021-09-13 LAB — LACTATE DEHYDROGENASE: LDH: 146 U/L (ref 98–192)

## 2021-09-13 LAB — PROTEIN, PLEURAL OR PERITONEAL FLUID: Total protein, fluid: <3 g/dL

## 2021-09-13 LAB — GLUCOSE, PLEURAL OR PERITONEAL FLUID: Glucose, Fluid: 95 mg/dL

## 2021-09-13 LAB — MAGNESIUM: Magnesium: 1.6 mg/dL — ABNORMAL LOW (ref 1.7–2.4)

## 2021-09-13 LAB — LACTATE DEHYDROGENASE, PLEURAL OR PERITONEAL FLUID: LD, Fluid: 92 U/L — ABNORMAL HIGH (ref 3–23)

## 2021-09-13 LAB — PHOSPHORUS: Phosphorus: 2.7 mg/dL (ref 2.5–4.6)

## 2021-09-13 MED ORDER — MAGNESIUM OXIDE -MG SUPPLEMENT 400 (240 MG) MG PO TABS
400.0000 mg | ORAL_TABLET | Freq: Two times a day (BID) | ORAL | Status: DC
  Administered 2021-09-13 – 2021-09-16 (×6): 400 mg via ORAL
  Filled 2021-09-13 (×10): qty 1

## 2021-09-13 MED ORDER — MAGNESIUM SULFATE 2 GM/50ML IV SOLN
2.0000 g | Freq: Once | INTRAVENOUS | Status: AC
  Administered 2021-09-13: 2 g via INTRAVENOUS
  Filled 2021-09-13: qty 50

## 2021-09-13 MED ORDER — POTASSIUM CHLORIDE 20 MEQ PO PACK
40.0000 meq | PACK | Freq: Once | ORAL | Status: AC
  Administered 2021-09-13: 40 meq via ORAL
  Filled 2021-09-13: qty 2

## 2021-09-13 MED ORDER — LIDOCAINE HCL 1 % IJ SOLN
INTRAMUSCULAR | Status: AC
  Filled 2021-09-13: qty 20

## 2021-09-13 MED ORDER — POTASSIUM CHLORIDE CRYS ER 20 MEQ PO TBCR
40.0000 meq | EXTENDED_RELEASE_TABLET | Freq: Once | ORAL | Status: DC
Start: 2021-09-13 — End: 2021-09-13

## 2021-09-13 NOTE — Progress Notes (Signed)
Left lower extremity venous duplex completed. ?Refer to "CV Proc" under chart review to view preliminary results. ? ?09/13/2021 12:27 PM ?Kelby Aline., MHA, RVT, RDCS, RDMS   ?

## 2021-09-13 NOTE — Procedures (Signed)
Ultrasound-guided diagnostic and therapeutic right thoracentesis performed yielding 1.2 liters of turbid, light yellow  fluid. No immediate complications. Follow-up chest x-ray pending. The fluid was sent to the lab for preordered studies. EBL < 1 cc.      ? ?

## 2021-09-13 NOTE — Plan of Care (Signed)
?  Problem: Education: ?Goal: Ability to identify signs and symptoms of gastrointestinal bleeding will improve ?Outcome: Progressing ?  ?Problem: Fluid Volume: ?Goal: Will show no signs and symptoms of excessive bleeding ?Outcome: Progressing ?  ?Problem: Clinical Measurements: ?Goal: Complications related to the disease process, condition or treatment will be avoided or minimized ?Outcome: Progressing ?  ?Problem: Education: ?Goal: Knowledge of General Education information will improve ?Description: Including pain rating scale, medication(s)/side effects and non-pharmacologic comfort measures ?Outcome: Progressing ?  ?Problem: Clinical Measurements: ?Goal: Ability to maintain clinical measurements within normal limits will improve ?Outcome: Progressing ?Goal: Will remain free from infection ?Outcome: Progressing ?Goal: Diagnostic test results will improve ?Outcome: Progressing ?Goal: Respiratory complications will improve ?Outcome: Progressing ?Goal: Cardiovascular complication will be avoided ?Outcome: Progressing ?  ?Problem: Coping: ?Goal: Level of anxiety will decrease ?Outcome: Progressing ?  ?Problem: Elimination: ?Goal: Will not experience complications related to urinary retention ?Outcome: Progressing ?  ?Problem: Safety: ?Goal: Ability to remain free from injury will improve ?Outcome: Progressing ?  ?Problem: Skin Integrity: ?Goal: Risk for impaired skin integrity will decrease ?Outcome: Progressing ?  ?Problem: Bowel/Gastric: ?Goal: Will show no signs and symptoms of gastrointestinal bleeding ?Outcome: Not Progressing ?  ?Problem: Health Behavior/Discharge Planning: ?Goal: Ability to manage health-related needs will improve ?Outcome: Not Progressing ?  ?Problem: Activity: ?Goal: Risk for activity intolerance will decrease ?Outcome: Not Progressing ?  ?Problem: Nutrition: ?Goal: Adequate nutrition will be maintained ?Outcome: Not Progressing ?  ?Problem: Elimination: ?Goal: Will not experience  complications related to bowel motility ?Outcome: Not Progressing ?  ?Problem: Pain Managment: ?Goal: General experience of comfort will improve ?Outcome: Not Progressing ?  ?

## 2021-09-13 NOTE — Plan of Care (Signed)
?  Problem: Elimination: ?Goal: Will not experience complications related to urinary retention ?09/13/2021 2013 by Tula Nakayama, RN ?Outcome: Not Progressing ?09/13/2021 2013 by Tula Nakayama, RN ?Outcome: Progressing ?  ? ?Problem: Education: ?Goal: Ability to identify signs and symptoms of gastrointestinal bleeding will improve ?09/13/2021 2013 by Tula Nakayama, RN ?Outcome: Progressing ?09/13/2021 2013 by Tula Nakayama, RN ?Outcome: Progressing ?  ?Problem: Bowel/Gastric: ?Goal: Will show no signs and symptoms of gastrointestinal bleeding ?09/13/2021 2013 by Tula Nakayama, RN ?Outcome: Progressing ?09/13/2021 2013 by Tula Nakayama, RN ?Outcome: Progressing ?  ?Problem: Fluid Volume: ?Goal: Will show no signs and symptoms of excessive bleeding ?09/13/2021 2013 by Tula Nakayama, RN ?Outcome: Progressing ?09/13/2021 2013 by Tula Nakayama, RN ?Outcome: Progressing ?  ?Problem: Clinical Measurements: ?Goal: Complications related to the disease process, condition or treatment will be avoided or minimized ?09/13/2021 2013 by Tula Nakayama, RN ?Outcome: Progressing ?09/13/2021 2013 by Tula Nakayama, RN ?Outcome: Progressing ?  ?Problem: Education: ?Goal: Knowledge of General Education information will improve ?Description: Including pain rating scale, medication(s)/side effects and non-pharmacologic comfort measures ?09/13/2021 2013 by Tula Nakayama, RN ?Outcome: Progressing ?09/13/2021 2013 by Tula Nakayama, RN ?Outcome: Progressing ?  ?Problem: Health Behavior/Discharge Planning: ?Goal: Ability to manage health-related needs will improve ?09/13/2021 2013 by Tula Nakayama, RN ?Outcome: Progressing ?09/13/2021 2013 by Tula Nakayama, RN ?Outcome: Progressing ?  ?Problem: Clinical Measurements: ?Goal: Ability to maintain clinical measurements within normal limits will improve ?09/13/2021 2013 by Tula Nakayama, RN ?Outcome: Progressing ?09/13/2021 2013 by Tula Nakayama, RN ?Outcome:  Progressing ?Goal: Will remain free from infection ?09/13/2021 2013 by Tula Nakayama, RN ?Outcome: Progressing ?09/13/2021 2013 by Tula Nakayama, RN ?Outcome: Progressing ?Goal: Diagnostic test results will improve ?09/13/2021 2013 by Tula Nakayama, RN ?Outcome: Progressing ?09/13/2021 2013 by Tula Nakayama, RN ?Outcome: Progressing ?Goal: Respiratory complications will improve ?09/13/2021 2013 by Tula Nakayama, RN ?Outcome: Progressing ?09/13/2021 2013 by Tula Nakayama, RN ?Outcome: Progressing ?Goal: Cardiovascular complication will be avoided ?09/13/2021 2013 by Tula Nakayama, RN ?Outcome: Progressing ?09/13/2021 2013 by Tula Nakayama, RN ?Outcome: Progressing ?  ?Problem: Activity: ?Goal: Risk for activity intolerance will decrease ?09/13/2021 2013 by Tula Nakayama, RN ?Outcome: Progressing ?09/13/2021 2013 by Tula Nakayama, RN ?Outcome: Progressing ?  ?Problem: Nutrition: ?Goal: Adequate nutrition will be maintained ?09/13/2021 2013 by Tula Nakayama, RN ?Outcome: Progressing ?09/13/2021 2013 by Tula Nakayama, RN ?Outcome: Progressing ?  ?Problem: Coping: ?Goal: Level of anxiety will decrease ?09/13/2021 2013 by Tula Nakayama, RN ?Outcome: Progressing ?09/13/2021 2013 by Tula Nakayama, RN ?Outcome: Progressing ?  ?Problem: Elimination: ?Goal: Will not experience complications related to bowel motility ?09/13/2021 2013 by Tula Nakayama, RN ?Outcome: Progressing ?09/13/2021 2013 by Tula Nakayama, RN ?Outcome: Progressing ?  ?Problem: Pain Managment: ?Goal: General experience of comfort will improve ?09/13/2021 2013 by Tula Nakayama, RN ?Outcome: Progressing ?09/13/2021 2013 by Tula Nakayama, RN ?Outcome: Progressing ?  ?Problem: Safety: ?Goal: Ability to remain free from injury will improve ?09/13/2021 2013 by Tula Nakayama, RN ?Outcome: Progressing ?09/13/2021 2013 by Tula Nakayama, RN ?Outcome: Progressing ?  ?Problem: Skin Integrity: ?Goal: Risk for impaired skin  integrity will decrease ?09/13/2021 2013 by Tula Nakayama, RN ?Outcome: Progressing ?09/13/2021 2013 by Tula Nakayama, RN ?Outcome: Progressing ?  ?

## 2021-09-13 NOTE — Progress Notes (Signed)
Patient have not urinated since 8am, bladder scan >397, offer patient to get up to use the [facility] or bed pan, patient refused stated she does not have the urge to void and will not try anything that will cause her pain, tried to I/O cath, noted moderate amount purulent virginal drainage, after cleaning patient started to I/O cath she refused to be I/O cath because it hurts. Dr. Florene Glen notified and also MD informed of patient medication refusal.  ?

## 2021-09-13 NOTE — Progress Notes (Signed)
RT placed patient on 6L [place] due to oxygen levels being 87-90%. Patient is now at 93% ?

## 2021-09-13 NOTE — Progress Notes (Signed)
?PROGRESS NOTE ? ? ? ?Terri [name]  PPJ:[id number] DOB: 05/01/1976 DOA: 08/31/2021 ?PCP: Pcp, No  ?Chief Complaint  ?Patient presents with  ? Rectal Bleeding  ? ? ?Brief Narrative:  ?46 year old female with history of acute upper GI bleed in setting of peptic ulcer disease, acute lower GI bleeding 2021, essential hypertension, chronic blood loss anemia, polysubstance abuse, portal vein thrombosis was admitted to [facility] for acute GI bleed after presenting from home complaining of dark-colored stool.  Gastroenterology was consulted, EGD on 09/03/2021 showed esophageal plaques consistent with candidiasis, large cratered nonbleeding duodenal ulcer with no stigmata of bleeding.  CT angiogram on 08/31/2021 showed multiple low-attenuation lesions throughout the liver seen on recent prior exam, highly concerning for hepatic abscesses or metastasis. ?  ?Biopsy showed abscess so culture was sent.  Growing Staph aureus.  ? ? ?Assessment & Plan: ?  ?Principal Problem: ?  Acute upper GI bleed ?Active Problems: ?  Acute on chronic blood loss anemia ?  Staph aureus infection ?  Hepatic abscess ?  Swelling of left hand ?  Gangrene of toe of left foot ([place]) ?  Portal vein thrombosis ?  Pleural effusion ?  Ascites ?  Hypokalemia ?  Polysubstance abuse ([name]) ?  Hypophosphatemia ?  Essential hypertension ?  Candida infection, esophageal ([name]) ?  Lactic acidosis ?  Hepatic lesion ?  Acute back pain ? ? ?Assessment and Plan: ?* Acute upper GI bleed ?Patient had 2 episodes of melena at home ?Gastroenterology consulted ?Underwent EGD on 09/03/2021 ?EGD 4/13 with esophageal plaques c/w candidiasis, large cratered non bleeding jejunal ulcer with non stigmata of bleeding.  Nodular regions more like regenerative tissue and not like visible vessels. ?GI recommended PPI, add sucralfate, add fluconazole ?GI now signed off, recommending PPI BID x2 months, sucralfate QID x2 weeks (now signed off) ?Will continue to trend Hb, stable ? ?Staph  aureus infection ?Concern for endocarditis ?Workup underway (see hepatic abscess) ? ?Acute on chronic blood loss anemia ? ? ? ?Hepatic abscess ?CTA abdomen/pelvis showed multiple low-attenuation lesions throughout the liver, highly concerning for hepatic abscesses or metastasis ?MRI liver showed findings concerning for metastases with tumor thrombus (abscesses within ddx) ?IR was consulted for liver biopsy/aspiration ?- bx 4/17 showed acute inflammation with necrosis compatible with abscess, granulomatous inflammation with eosinophilia and fragment of foreign material, negative for malignancy ?- aspiration 4/17 with abundant staph aureus (MSSA) ?ID c/s, appreciate assistance -> given staph in liver, L hand/wrist swelling, left big toe dry gangrene redness 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 -> workup underway  ?4/19 echo with EF 60-65%.  TEE pending at this time for 4/24. ?MRI lumbar and thoracic spine without discitis/osteomyelitis in thoracolumbar spine ?-pain management ?-CA 19-9, CEA, AFP are all unremarkable ?-GI recommends to repeat CT with contrast or MRI of liver with and without contrast in 4 weeks to confirm that liver lesions were abscess and not metastasis ?Continue antibiotics per ID, currently on ancef.  Plan for 6 weeks abx. ? ?Swelling of left hand ?MRI limited, no fluid collection or tenosynovitis seen ? ?Gangrene of toe of left foot ([name]) ?Seen by Dr. Scot Dock on 4/15, noted dry gangrene on tip of L great toe, palpable pedal pulses, thought to have adequate circulation to heal this (unclear etiology per vascular).  Recommended outpatient follow up, call back as needed. ?Will consider additional imaging prn ? ?Pleural effusion ?CXR 4/22 with layering R effusion and R mid and lower lung infiltrate vs atelectasis ?Thoracentesis 4/23 with 1.2 L of turbid  light yellow fluid ?Transudate - pending labs ?Follow body fluid culture and gram stain ?Follow cytology ? ?Portal vein thrombosis ?With recent GI bleeding, will  hold on anticoagulation ?Large burden of thrombus in central portal vein as well as R portal vein and branches ? ? ?Ascites ?2 L removed during liver bx, but analysis not performed at that point ?Repeat US didn't show enough fluid for repeat para/analysis ? ?Hypophosphatemia ?Replace and follow ? ?Polysubstance abuse ([place]) ?Encourage cessation ?UDS with cocaine ? ? ? ?Hypokalemia ?Replace and follow ? ?Essential hypertension ?Continue lisinopril, amlodipine, hydral ?Labetalol prn ? ?Candida infection, esophageal ([name]) ?"Esophageal plaques were found, consistent with candidiasis" ?-- continue fluconazole ?-- check HIV nonreactive ?-- negative acute hepatitis panel  ? ?Hepatic lesion ? ? ? ? ? ? ?Lactic acidosis ? ? ? ? ?DVT prophylaxis: SCD ?Code Status: full ?Family Communication: none ?Disposition:  ? ?Status is: Inpatient ?Remains inpatient appropriate because: need for additional w/u IV abx ?  ?Consultants:  ?ID ?GI ?IR ?vascular ? ?Procedures:  ?Thoracentesis 4/23 ?Upper endoscopy ?US guided core liver bx, aspiration of liver lesion for cx, US paracentesis ?Echo ?IMPRESSIONS  ? ? ? 1. Limited echo windows due to body habitus.  ? 2. Left ventricular ejection fraction, by estimation, is 60 to 65%. The  ?left ventricle has normal function. The left ventricle has no regional  ?wall motion abnormalities. Left ventricular diastolic parameters were  ?normal.  ? 3. Right ventricular systolic function is normal. The right ventricular  ?size is normal. There is moderately elevated pulmonary artery systolic  ?pressure.  ? 4. Vitor Overbaugh small pericardial effusion is present. The pericardial effusion is  ?posterior to the left ventricle and anterior to the right ventricle.  ? 5. The mitral valve is abnormal. No evidence of mitral valve  ?regurgitation. No evidence of mitral stenosis. Moderate mitral annular  ?calcification.  ? 6. The aortic valve is normal in structure. There is mild calcification  ?of the aortic valve. There is mild  thickening of the aortic valve. Aortic  ?valve regurgitation is trivial. Aortic valve sclerosis is present, with no  ?evidence of aortic valve  ?stenosis.  ? 7. The inferior vena cava is normal in size with greater than 50%  ?respiratory variability, suggesting right atrial pressure of 3 mmHg.  ? ?ABI ?Summary:  ?Right: Resting right ankle-brachial index is within normal range. No  ?evidence of significant right lower extremity arterial disease. The right  ?toe-brachial index is abnormal.  ? ?Left: Resting left ankle-brachial index is within normal range. No  ?evidence of significant left lower extremity arterial disease.  ? ?Antimicrobials:  ?Anti-infectives (From admission, onward)  ? ? Start     Dose/Rate Route Frequency Ordered Stop  ? 09/09/21 1500  ceFAZolin (ANCEF) IVPB 2g/100 mL premix       ? 2 g ?200 mL/hr over 30 Minutes Intravenous Every 8 hours 09/09/21 1407    ? 09/09/21 1000  fluconazole (DIFLUCAN) tablet 200 mg       ? 200 mg Oral Daily 09/08/21 1504 09/21/21 0959  ? 09/08/21 2300  vancomycin (VANCOREADY) IVPB 750 mg/150 mL  Status:  Discontinued       ? 750 mg ?150 mL/hr over 60 Minutes Intravenous Every 12 hours 09/08/21 1008 09/09/21 1407  ? 09/08/21 1600  cefTRIAXone (ROCEPHIN) 1 g in sodium chloride 0.9 % 100 mL IVPB  Status:  Discontinued       ? 1 g ?200 mL/hr over 30 Minutes Intravenous Every 24 hours 09/08/21  1513 09/09/21 1407  ? 09/08/21 1100  vancomycin (VANCOREADY) IVPB 1250 mg/250 mL       ? 1,250 mg ?166.7 mL/hr over 90 Minutes Intravenous  Once 09/08/21 1005 09/08/21 1230  ? 09/04/21 1000  fluconazole (DIFLUCAN) tablet 100 mg  Status:  Discontinued       ? 100 mg Oral Daily 09/03/21 1334 09/08/21 1504  ? 09/03/21 1500  fluconazole (DIFLUCAN) IVPB 200 mg       ? 200 mg ?100 mL/hr over 60 Minutes Intravenous  Once 09/03/21 1334 09/03/21 1637  ? 08/31/21 2030  cefTRIAXone (ROCEPHIN) 1 g in sodium chloride 0.9 % 100 mL IVPB       ? 1 g ?200 mL/hr over 30 Minutes Intravenous  Once 08/31/21  2026 08/31/21 2242  ? ?  ? ? ?Subjective: ?No new complaints ?Continued pain ?Anxiety regarding planned thora, wants pain meds first ? ?Objective: ?Vitals:  ? 09/13/21 0931 09/13/21 1032 09/13/21 1050

## 2021-09-14 DIAGNOSIS — K922 Gastrointestinal hemorrhage, unspecified: Secondary | ICD-10-CM | POA: Diagnosis not present

## 2021-09-14 DIAGNOSIS — N898 Other specified noninflammatory disorders of vagina: Secondary | ICD-10-CM

## 2021-09-14 LAB — COMPREHENSIVE METABOLIC PANEL
ALT: <5 U/L (ref 0–44)
AST: 21 U/L (ref 15–41)
Albumin: <1.5 g/dL — ABNORMAL LOW (ref 3.5–5.0)
Alkaline Phosphatase: 169 U/L — ABNORMAL HIGH (ref 38–126)
Anion gap: 5 (ref 5–15)
BUN: 13 mg/dL (ref 6–20)
CO2: 25 mmol/L (ref 22–32)
Calcium: 7 mg/dL — ABNORMAL LOW (ref 8.9–10.3)
Chloride: 103 mmol/L (ref 98–111)
Creatinine, Ser: 0.3 mg/dL — ABNORMAL LOW (ref 0.44–1.00)
GFR, Estimated: >60 mL/min (ref >60)
Glucose, Bld: 83 mg/dL (ref 70–99)
Potassium: 3.8 mmol/L (ref 3.5–5.1)
Sodium: 133 mmol/L — ABNORMAL LOW (ref 135–145)
Total Bilirubin: 0.6 mg/dL (ref 0.3–1.2)
Total Protein: 4.3 g/dL — ABNORMAL LOW (ref 6.5–8.1)

## 2021-09-14 LAB — CBC WITH DIFFERENTIAL/PLATELET
Abs Immature Granulocytes: 0.03 10*3/uL (ref 0.00–0.07)
Basophils Absolute: 0 10*3/uL (ref 0.0–0.1)
Basophils Relative: 1 %
Eosinophils Absolute: 0.1 10*3/uL (ref 0.0–0.5)
Eosinophils Relative: 2 %
HCT: 23.8 % — ABNORMAL LOW (ref 36.0–46.0)
Hemoglobin: 7.8 g/dL — ABNORMAL LOW (ref 12.0–15.0)
Immature Granulocytes: 0 %
Lymphocytes Relative: 15 %
Lymphs Abs: 1.1 10*3/uL (ref 0.7–4.0)
MCH: 25.7 pg — ABNORMAL LOW (ref 26.0–34.0)
MCHC: 32.8 g/dL (ref 30.0–36.0)
MCV: 78.3 fL — ABNORMAL LOW (ref 80.0–100.0)
Monocytes Absolute: 0.5 10*3/uL (ref 0.1–1.0)
Monocytes Relative: 6 %
Neutro Abs: 5.6 10*3/uL (ref 1.7–7.7)
Neutrophils Relative %: 76 %
Platelets: 166 10*3/uL (ref 150–400)
RBC: 3.04 MIL/uL — ABNORMAL LOW (ref 3.87–5.11)
RDW: 24.5 % — ABNORMAL HIGH (ref 11.5–15.5)
WBC: 7.4 10*3/uL (ref 4.0–10.5)
nRBC: 0 % (ref 0.0–0.2)

## 2021-09-14 LAB — LACTATE DEHYDROGENASE: LDH: 99 U/L (ref 98–192)

## 2021-09-14 LAB — PHOSPHORUS: Phosphorus: 2.4 mg/dL — ABNORMAL LOW (ref 2.5–4.6)

## 2021-09-14 LAB — MAGNESIUM: Magnesium: 2.1 mg/dL (ref 1.7–2.4)

## 2021-09-14 MED ORDER — METRONIDAZOLE 500 MG PO TABS
500.0000 mg | ORAL_TABLET | Freq: Two times a day (BID) | ORAL | Status: DC
  Administered 2021-09-14 – 2021-09-18 (×8): 500 mg via ORAL
  Filled 2021-09-14 (×8): qty 1

## 2021-09-14 NOTE — Progress Notes (Signed)
?   09/13/21 2200  ?Oxygen Therapy  ?SpO2 (!) 88 %  ? ?O2 SATS 88% on RA.  Encouraged pt to take a deep breathe with no improvement.  Put pt on 02 at 2lnc no changes, Increased to 4lnc marginal changes but not satisfactory.Consulted with Respiratory for recommendations.  RT to see pt shortly. ?

## 2021-09-14 NOTE — TOC Progression Note (Signed)
Transition of Care (TOC) - Progression Note  ? ? ?Patient Details  ?Name: ANALEA MULLER ?MRN: [id number] ?Date of Birth: [birth year] ? ?Transition of Care (TOC) CM/SW Contact  ?Leeroy Cha, RN ?Phone Number: ?09/14/2021, 10:22 AM ? ?Clinical Narrative:    ?Chart review for toc needs.  Remains on hfnc at 6l/min.  O2 drops to 84%.  Scheduled for TEE [id number] ? ? ?Expected Discharge Plan: Home/Self Care ?Barriers to Discharge: Continued Medical Work up ? ?Expected Discharge Plan and Services ?Expected Discharge Plan: Home/Self Care ?  ?Discharge Planning Services: CM Consult ?  ?Living arrangements for the past 2 months: No permanent address ?                ?  ?  ?  ?  ?  ?  ?  ?  ?  ?  ? ? ?Social Determinants of Health (SDOH) Interventions ?  ? ?Readmission Risk Interventions ? ?  09/02/2021  ?  2:25 PM  ?Readmission Risk Prevention Plan  ?Transportation Screening Complete  ?Medication Review Press photographer) Complete  ?PCP or Specialist appointment within 3-5 days of discharge Complete  ?Palliative Care Screening Not Applicable  ?[place] Not Applicable  ? ? ?

## 2021-09-14 NOTE — Progress Notes (Signed)
?  [facility]   ? ?Date of Admission:  08/31/2021    ? ?ID: Terri [name] is a 46 y.o. female with substance abuse (denies ivdu -- only snorted heroin), hx roux-n-y gastric bypass distant past, hx left fibular orif distant past, cirrhosis admitted 08/31/21 for a week of feeling ill with left toe redness/pain & gangrenous change to left great toe, left hand/wrist swelling-pain, and black/bloody stool, found to have imaging evidence multiple liver mass/abscesses and cirrhosis, and on liver mass biopsy cx growing staph aureus. Other complication include esophageal candidiasis, and imaging suggestion of portal vein thrombus. ?  ?Id w/u: ?4/18 bcx ngtd ?4/17 liver mass/abscess biopsy cx mssa; pathology mentioned abscesses but no cirrhosis ?4/13 Acute hepatitis panel negative; hiv screen negative ?4/13 egd with evidence suggestive of esophageal candidiasis ?  ?Principal Problem: ?  Acute upper GI bleed ?Active Problems: ?  Essential hypertension ?  Hypokalemia ?  Polysubstance abuse ([place]) ?  Acute on chronic blood loss anemia ?  Portal vein thrombosis ?  Lactic acidosis ?  Hepatic lesion ?  Candida infection, esophageal ([place]) ?  Hepatic abscess ?  Acute back pain ?  Swelling of left hand ?  Gangrene of toe of left foot ([place]) ?  Staph aureus infection ?  Ascites ?  Pleural effusion ?  Hypophosphatemia ? ? ? ?Subjective: ?Has difficulty urinating with dysuria due to vaginal discharge.  ? ?Medications:  ? amLODipine  10 mg Oral Daily  ? Chlorhexidine Gluconate Cloth  6 each Topical Daily  ? fluconazole  200 mg Oral Daily  ? guaiFENesin  600 mg Oral BID  ? hydrALAZINE  50 mg Oral BID  ? lidocaine  1 patch Transdermal Daily  ? lisinopril  10 mg Oral QHS  ? magnesium oxide  400 mg Oral BID  ? metroNIDAZOLE  500 mg Oral Q12H  ? nicotine  21 mg Transdermal Daily  ? pantoprazole (PROTONIX) IV  40 mg Intravenous Q12H  ? sucralfate  1 g Oral TID WC & HS  ? ? ?Objective: ?Vital signs in last 24  hours: ?Temp:  [97.8 ?F (36.6 ?C)-98.4 ?F (36.9 ?C)] 98.1 ?F (36.7 ?C) (04/24 0800) ?Pulse Rate:  [86-104] 86 (04/24 0959) ?Resp:  [17-21] 18 (04/24 0959) ?BP: (94-139)/(72-91) 120/89 (04/24 1041) ?SpO2:  [88 %-98 %] 95 % (04/24 0959) ?Weight:  [63.2 kg] 63.2 kg (04/24 0416) ? ?Physical Exam  ?Constitutional:  oriented to person, place, and time. appears chronically ill appearing and malnourished. No distress.  ?HENT: [place]/AT, PERRLA, no scleral icterus; facial wasting  ?Mouth/Throat: Oropharynx is clear and moist. No oropharyngeal exudate.  ?Cardiovascular: Normal rate, regular rhythm and normal heart sounds. Exam reveals no gallop and no friction rub.  ?No murmur heard.  ?Pulmonary/Chest: Effort normal and breath sounds normal. No respiratory distress.  has no wheezes.  ?Neck = supple, no nuchal rigidity ?Abdominal: Soft. Bowel sounds are normal.  exhibits no distension. There is no tenderness.  ?Gu= whitish vaginal discharge ?Ext: eschar to tip of left great toe but painful, redness to shaft. ?Neurological: alert and oriented to person, place, and time.  ?Skin: Skin is warm and dry. No rash noted. No erythema.  ?Psychiatric: a normal mood and affect.  behavior is normal.  ? ? ?Lab Results ?Recent Labs  ?  09/13/21 ?0321 09/14/21 ?3382  ?WBC 11.6* 7.4  ?HGB 10.4* 7.8*  ?HCT 31.2* 23.8*  ?NA 132* 133*  ?K 3.6 3.8  ?CL 104 103  ?CO2 23  25  ?BUN 11 13  ?CREATININE 0.39* 0.30*  ? ?Liver Panel ?Recent Labs  ?  09/13/21 ?0321 09/14/21 ?4315  ?PROT 4.4* 4.3*  ?ALBUMIN <1.5* <1.5*  ?AST 18 21  ?ALT 9 <5  ?ALKPHOS 180* 169*  ?BILITOT 0.4 0.6  ? ? ? ?Microbiology: ?Hepatic aspirate = MSSA and bacteroides ?Studies/Results: ?DG Chest 1 View ? ?Result Date: 09/13/2021 ?CLINICAL DATA:  46 year old female status post thoracentesis. EXAM: CHEST  1 VIEW COMPARISON:  Chest x-ray 09/12/2021. FINDINGS: Previously noted right-sided pleural effusion has nearly completely resolved following thoracentesis. No appreciable pneumothorax.  Extensive airspace consolidation is noted throughout the right lung, most evident in the right mid lung. Patchy ill-defined airspace disease also noted in the medial aspect of the left lung base. No left pleural effusion. No pneumothorax. No evidence of pulmonary edema. Heart size is normal. Upper mediastinal contours are within normal limits. IMPRESSION: 1. Near complete resolution of right-sided pleural effusion following thoracentesis. No postprocedural pneumothorax. 2. Severe multilobar bilateral pneumonia, most pronounced in the right mid lung. Followup PA and lateral chest X-ray is recommended in 3-4 weeks following trial of antibiotic therapy to ensure resolution and exclude underlying malignancy. Electronically Signed   By: Vinnie Langton M.D.   On: 09/13/2021 11:28  ? ?MR HAND LEFT W WO CONTRAST ? ?Result Date: 09/12/2021 ?CLINICAL DATA:  Swelling of left hand.  Best images obtained. EXAM: MRI OF THE LEFT HAND WITHOUT AND WITH CONTRAST TECHNIQUE: Multiplanar, multisequence MR imaging of the left hand was performed before and after the administration of intravenous contrast. Best images obtained. Patient was placed in right lateral decubitus position for imaging. Patient unable to tolerate any other position. The technologist had to repeat scout images for the postcontrast sequences due to patient movement. CONTRAST:  33m GADAVIST GADOBUTROL 1 MMOL/ML IV SOLN COMPARISON:  None available FINDINGS: The technologist reports this study was limited due to patient having to be positioned in a lateral decubitus position. There is minimal fat saturation on the coronal T1 fat sat postcontrast, coronal STIR sagittal proton density fat saturation trauma and axial T1 fat saturation postcontrast sequences. Additionally, there is unexpected fat saturation seen on the axial T1 and coronal T1 weighted sequences. Bones/Joint/Cartilage Within the limitations described above, no acute fracture is seen. Moderate to severe  thumb carpometacarpal joint space narrowing and peripheral osteophytosis. Likely moderate triscaphe joint space narrowing. Mild fifth finger PIP medial apex angulation with likely mild joint space narrowing and peripheral osteophytosis degenerative changes. Ligaments The medial and ulnar collateral ligaments of the visualized fingers appear grossly intact. Muscles and Tendons Within the limitations of decreased signal to noise, the dorsal extensor tendon compartments and more distal dorsal finger extensor tendons appear intact. The flexor tendons and flexor retinaculum appear intact. No definite tenosynovitis. Soft tissues No fluid collection is seen. IMPRESSION:: IMPRESSION: 1. Limited study due to patient condition and positioning, inhomogeneous fat saturation, and nonconventional fat saturation of some precontrast sequences and non-fat saturation of some post-contrast sequences. 2. 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 3. No definite fluid collection or tenosynovitis is seen. Electronically Signed   By: RYvonne KendallM.D.   On: 09/12/2021 19:13  ? ?VAS [place]OWER EXTREMITY VENOUS (DVT) ? ?Result Date: 09/13/2021 ? Lower Venous DVT Study Patient Name:  KELLIETTE SEABOLT Date of Exam:   09/13/2021 Medical Rec #: [id number]     Accession #:    [id number] of Birth: 629-Nov-1977     Patient  Gender: F Patient Age:   67 years Exam Location:  [facility] Procedure:      VAS [place] LOWER EXTREMITY VENOUS (DVT) Referring Phys: A POWELL JR --------------------------------------------------------------------------------  Indications: Edema.  Limitations: Body habitus. Comparison Study: No prior study Performing Technologist: Maudry Mayhew MHA, RDMS, RVT, RDCS  Examination Guidelines: A complete evaluation includes B-mode imaging, spectral Doppler, color Doppler, and power Doppler as needed of all accessible portions of each vessel. Bilateral testing is considered an integral  part of a complete examination. Limited examinations for reoccurring indications may be performed as noted. The reflux portion of the exam is performed with the patient in reverse Trendelenburg.  +-----+---------------+-----

## 2021-09-14 NOTE — Assessment & Plan Note (Addendum)
--  currently on metronidazole for presumed BV ?

## 2021-09-14 NOTE — Progress Notes (Signed)
?PROGRESS NOTE ? ? ? ?Terri [name]  XBM:[id number] DOB: [birth year] DOA: 08/31/2021 ?PCP: Pcp, No  ?Chief Complaint  ?Patient presents with  ? Rectal Bleeding  ? ? ?Brief Narrative:  ?46 year old female with history of acute upper GI bleed in setting of peptic ulcer disease, acute lower GI bleeding 2021, essential hypertension, chronic blood loss anemia, polysubstance abuse, portal vein thrombosis was admitted to [facility] for acute GI bleed after presenting from home complaining of dark-colored stool.  Gastroenterology was consulted, EGD on 09/03/2021 showed esophageal plaques consistent with candidiasis, large cratered nonbleeding duodenal ulcer with no stigmata of bleeding.  CT angiogram on 08/31/2021 showed multiple low-attenuation lesions throughout the liver seen on recent prior exam, highly concerning for hepatic abscesses or metastasis. ?  ?Biopsy showed abscess so culture was sent.  Growing Staph aureus.  ? ? ?Assessment & Plan: ?  ?Principal Problem: ?  Acute upper GI bleed ?Active Problems: ?  Acute on chronic blood loss anemia ?  Staph aureus infection ?  Hepatic abscess ?  Swelling of left hand ?  Gangrene of toe of left foot ([place]) ?  Portal vein thrombosis ?  Pleural effusion ?  Vaginal discharge ?  Ascites ?  Hypokalemia ?  Polysubstance abuse ([place]) ?  Hypophosphatemia ?  Essential hypertension ?  Candida infection, esophageal ([place]) ?  Lactic acidosis ?  Hepatic lesion ?  Acute back pain ? ? ?Assessment and Plan: ?* Acute upper GI bleed ?Patient had 2 episodes of melena at home ?Gastroenterology consulted ?Underwent EGD on 09/03/2021 ?EGD 4/13 with esophageal plaques c/w candidiasis, large cratered non bleeding jejunal ulcer with non stigmata of bleeding.  Nodular regions more like regenerative tissue and not like visible vessels. ?GI recommended PPI, add sucralfate, add fluconazole ?GI now signed off, recommending PPI BID x2 months, sucralfate QID x2 weeks (now signed off) ?Will continue to trend  Hb, stable ? ?Staph aureus infection ?Concern for endocarditis ?Culture with staph aureus, bacteroides oralis beta lactamase positive ?Workup underway (see hepatic abscess) ? ?Acute on chronic blood loss anemia ? ? ? ?Hepatic abscess ?CTA abdomen/pelvis showed multiple low-attenuation lesions throughout the liver, highly concerning for hepatic abscesses or metastasis ?MRI liver showed findings concerning for metastases with tumor thrombus (abscesses within ddx) ?IR was consulted for liver biopsy/aspiration ?- bx 4/17 showed acute inflammation with necrosis compatible with abscess, granulomatous inflammation with eosinophilia and fragment of foreign material, negative for malignancy ?- aspiration 4/17 with abundant staph aureus (MSSA), abundant bacteroides oralis ?ID c/s, appreciate assistance -> given staph in liver, L hand/wrist swelling, left big toe dry gangrene redness 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 -> workup underway  ?4/19 echo with EF 60-65%.  TEE pending at this time for 4/25. ?MRI lumbar and thoracic spine without discitis/osteomyelitis in thoracolumbar spine ?-pain management ?-CA 19-9, CEA, AFP are all unremarkable ?-GI recommends to repeat CT with contrast or MRI of liver with and without contrast in 4 weeks to confirm that liver lesions were abscess and not metastasis ?Continue antibiotics per ID, currently on ancef, adding flagyl.  Plan for 6 weeks abx. ? ?Swelling of left hand ?MRI limited, no fluid collection or tenosynovitis seen ? ?Gangrene of toe of left foot ([place]) ?Seen by Dr. Scot Dock on 4/15, noted dry gangrene on tip of L great toe, palpable pedal pulses, thought to have adequate circulation to heal this (unclear etiology per vascular).  Recommended outpatient follow up, call back as needed. ?Will consider additional imaging prn ? ?Vaginal discharge ?Wet prep and gc/chlam ? ?  Pleural effusion ?CXR 4/22 with layering R effusion and R mid and lower lung infiltrate vs atelectasis ?Thoracentesis 4/23 with  1.2 L of turbid light yellow fluid ?Post thora CXR with severe multilobar bilateral pneumonia, most pronounced in R mid lung, needs f/u PA/lateral in 3-4 weeks following abx therapy to ensure resolution ?Transudate - pending labs ?Follow body fluid culture (no growth) and gram stain (no organisms) ?Follow cytology ? ?Portal vein thrombosis ?With recent GI bleeding, will hold on anticoagulation ?Large burden of thrombus in central portal vein as well as R portal vein and branches ? ? ?Ascites ?2 L removed during liver bx, but analysis not performed at that point ?Repeat US didn't show enough fluid for repeat para/analysis ? ?Hypophosphatemia ?Replace and follow ? ?Polysubstance abuse ([place]) ?Encourage cessation ?UDS with cocaine ? ? ? ?Hypokalemia ?Replace and follow ? ?Essential hypertension ?Continue lisinopril, amlodipine, hydral ?Labetalol prn ? ?Candida infection, esophageal ([place]) ?"Esophageal plaques were found, consistent with candidiasis" ?-- continue fluconazole ?-- check HIV nonreactive ?-- negative acute hepatitis panel  ? ?Hepatic lesion ? ? ? ? ? ? ?Lactic acidosis ? ? ? ? ?DVT prophylaxis: SCD ?Code Status: full ?Family Communication: none ?Disposition:  ? ?Status is: Inpatient ?Remains inpatient appropriate because: need for additional w/u IV abx ?  ?Consultants:  ?ID ?GI ?IR ?vascular ? ?Procedures:  ?Thoracentesis 4/23 ?Upper endoscopy ?US guided core liver bx, aspiration of liver lesion for cx, US paracentesis ?Echo ?IMPRESSIONS  ? ? ? 1. Limited echo windows due to body habitus.  ? 2. Left ventricular ejection fraction, by estimation, is 60 to 65%. The  ?left ventricle has normal function. The left ventricle has no regional  ?wall motion abnormalities. Left ventricular diastolic parameters were  ?normal.  ? 3. Right ventricular systolic function is normal. The right ventricular  ?size is normal. There is moderately elevated pulmonary artery systolic  ?pressure.  ? 4. [place] small pericardial effusion is  present. The pericardial effusion is  ?posterior to the left ventricle and anterior to the right ventricle.  ? 5. The mitral valve is abnormal. No evidence of mitral valve  ?regurgitation. No evidence of mitral stenosis. Moderate mitral annular  ?calcification.  ? 6. The aortic valve is normal in structure. There is mild calcification  ?of the aortic valve. There is mild thickening of the aortic valve. Aortic  ?valve regurgitation is trivial. Aortic valve sclerosis is present, with no  ?evidence of aortic valve  ?stenosis.  ? 7. The inferior vena cava is normal in size with greater than 50%  ?respiratory variability, suggesting right atrial pressure of 3 mmHg.  ? ?ABI ?Summary:  ?Right: Resting right ankle-brachial index is within normal range. No  ?evidence of significant right lower extremity arterial disease. The right  ?toe-brachial index is abnormal.  ? ?Left: Resting left ankle-brachial index is within normal range. No  ?evidence of significant left lower extremity arterial disease.  ? ?Antimicrobials:  ?Anti-infectives (From admission, onward)  ? ? Start     Dose/Rate Route Frequency Ordered Stop  ? 09/14/21 1400  metroNIDAZOLE (FLAGYL) tablet 500 mg       ? 500 mg Oral Every 12 hours 09/14/21 1317    ? 09/09/21 1500  ceFAZolin (ANCEF) IVPB 2g/100 mL premix       ? 2 g ?200 mL/hr over 30 Minutes Intravenous Every 8 hours 09/09/21 1407    ? 09/09/21 1000  fluconazole (DIFLUCAN) tablet 200 mg       ? 200 mg Oral Daily  09/08/21 1504 09/21/21 0959  ? 09/08/21 2300  vancomycin (VANCOREADY) IVPB 750 mg/150 mL  Status:  Discontinued       ? 750 mg ?150 mL/hr over 60 Minutes Intravenous Every 12 hours 09/08/21 1008 09/09/21 1407  ? 09/08/21 1600  cefTRIAXone (ROCEPHIN) 1 g in sodium chloride 0.9 % 100 mL IVPB  Status:  Discontinued       ? 1 g ?200 mL/hr over 30 Minutes Intravenous Every 24 hours 09/08/21 1513 09/09/21 1407  ? 09/08/21 1100  vancomycin (VANCOREADY) IVPB 1250 mg/250 mL       ? 1,250 mg ?166.7 mL/hr  over 90 Minutes Intravenous  Once 09/08/21 1005 09/08/21 1230  ? 09/04/21 1000  fluconazole (DIFLUCAN) tablet 100 mg  Status:  Discontinued       ? 100 mg Oral Daily 09/03/21 1334 09/08/21 1504  ? 04/13/

## 2021-09-14 NOTE — Progress Notes (Signed)
? ? ?  Paged by nursing staff reporting that patient is not on schedule for TEE. Discussed with endo charge nurse, unclear why patient was not the schedule for TEE, no records suggesting cancellation. Patient is placed on schedule for TEE tomorrow 09/15/21, NPO after midnight, orders re-entered. Consent noted from 09/11/21.  ?

## 2021-09-15 ENCOUNTER — Inpatient Hospital Stay (HOSPITAL_COMMUNITY): Payer: Medicaid Other

## 2021-09-15 ENCOUNTER — Encounter (HOSPITAL_COMMUNITY): Payer: Self-pay | Admitting: Certified Registered Nurse Anesthetist

## 2021-09-15 ENCOUNTER — Encounter (HOSPITAL_COMMUNITY)
Admission: EM | Disposition: A | Discharge status: Skilled Nursing Facility | Payer: Self-pay | Source: Home / Self Care | Attending: Family Medicine

## 2021-09-15 ENCOUNTER — Inpatient Hospital Stay (HOSPITAL_COMMUNITY)
Admit: 2021-09-15 | Discharge: 2021-09-15 | Disposition: A | Discharge status: Home / Self Care | Payer: Medicaid Other | Attending: Home Health | Admitting: Home Health

## 2021-09-15 DIAGNOSIS — J9601 Acute respiratory failure with hypoxia: Secondary | ICD-10-CM

## 2021-09-15 DIAGNOSIS — R300 Dysuria: Secondary | ICD-10-CM | POA: Insufficient documentation

## 2021-09-15 DIAGNOSIS — K922 Gastrointestinal hemorrhage, unspecified: Secondary | ICD-10-CM | POA: Diagnosis not present

## 2021-09-15 LAB — COMPREHENSIVE METABOLIC PANEL
ALT: 7 U/L (ref 0–44)
AST: 17 U/L (ref 15–41)
Albumin: <1.5 g/dL — ABNORMAL LOW (ref 3.5–5.0)
Alkaline Phosphatase: 153 U/L — ABNORMAL HIGH (ref 38–126)
Anion gap: 3 — ABNORMAL LOW (ref 5–15)
BUN: 11 mg/dL (ref 6–20)
CO2: 27 mmol/L (ref 22–32)
Calcium: 7 mg/dL — ABNORMAL LOW (ref 8.9–10.3)
Chloride: 103 mmol/L (ref 98–111)
Creatinine, Ser: 0.45 mg/dL (ref 0.44–1.00)
GFR, Estimated: >60 mL/min (ref >60)
Glucose, Bld: 84 mg/dL (ref 70–99)
Potassium: 3.5 mmol/L (ref 3.5–5.1)
Sodium: 133 mmol/L — ABNORMAL LOW (ref 135–145)
Total Bilirubin: 0.3 mg/dL (ref 0.3–1.2)
Total Protein: 4.2 g/dL — ABNORMAL LOW (ref 6.5–8.1)

## 2021-09-15 LAB — URINALYSIS, ROUTINE W REFLEX MICROSCOPIC
Bacteria, UA: NONE SEEN
Bilirubin Urine: NEGATIVE
Glucose, UA: NEGATIVE mg/dL
Hgb urine dipstick: NEGATIVE
Ketones, ur: NEGATIVE mg/dL
Nitrite: NEGATIVE
Protein, ur: NEGATIVE mg/dL
Specific Gravity, Urine: 1.016 (ref 1.005–1.030)
pH: 6 (ref 5.0–8.0)

## 2021-09-15 LAB — GC/CHLAMYDIA PROBE AMP (~~LOC~~) NOT AT ARMC
Chlamydia: NEGATIVE
Comment: NEGATIVE
Comment: NORMAL
Neisseria Gonorrhea: NEGATIVE

## 2021-09-15 LAB — CYTOLOGY - NON PAP

## 2021-09-15 LAB — CBC WITH DIFFERENTIAL/PLATELET
Abs Immature Granulocytes: 0.02 10*3/uL (ref 0.00–0.07)
Basophils Absolute: 0 10*3/uL (ref 0.0–0.1)
Basophils Relative: 1 %
Eosinophils Absolute: 0.1 10*3/uL (ref 0.0–0.5)
Eosinophils Relative: 2 %
HCT: 24.9 % — ABNORMAL LOW (ref 36.0–46.0)
Hemoglobin: 8 g/dL — ABNORMAL LOW (ref 12.0–15.0)
Immature Granulocytes: 0 %
Lymphocytes Relative: 14 %
Lymphs Abs: 0.7 10*3/uL (ref 0.7–4.0)
MCH: 24.8 pg — ABNORMAL LOW (ref 26.0–34.0)
MCHC: 32.1 g/dL (ref 30.0–36.0)
MCV: 77.3 fL — ABNORMAL LOW (ref 80.0–100.0)
Monocytes Absolute: 0.3 10*3/uL (ref 0.1–1.0)
Monocytes Relative: 6 %
Neutro Abs: 3.9 10*3/uL (ref 1.7–7.7)
Neutrophils Relative %: 77 %
Platelets: 124 10*3/uL — ABNORMAL LOW (ref 150–400)
RBC: 3.22 MIL/uL — ABNORMAL LOW (ref 3.87–5.11)
RDW: 23.2 % — ABNORMAL HIGH (ref 11.5–15.5)
WBC: 5 10*3/uL (ref 4.0–10.5)
nRBC: 0 % (ref 0.0–0.2)

## 2021-09-15 LAB — HAPTOGLOBIN: Haptoglobin: 175 mg/dL (ref 42–296)

## 2021-09-15 LAB — MRSA NEXT GEN BY PCR, NASAL: MRSA by PCR Next Gen: DETECTED — AB

## 2021-09-15 LAB — VITAMIN B12: Vitamin B-12: 453 pg/mL (ref 180–914)

## 2021-09-15 SURGERY — ECHOCARDIOGRAM, TRANSESOPHAGEAL
Anesthesia: Monitor Anesthesia Care

## 2021-09-15 MED ORDER — FOLIC ACID 1 MG PO TABS
1.0000 mg | ORAL_TABLET | Freq: Every day | ORAL | Status: DC
  Administered 2021-09-15 – 2021-09-19 (×3): 1 mg via ORAL
  Filled 2021-09-15 (×4): qty 1

## 2021-09-15 MED ORDER — PANTOPRAZOLE SODIUM 40 MG PO TBEC
40.0000 mg | DELAYED_RELEASE_TABLET | Freq: Two times a day (BID) | ORAL | Status: DC
  Administered 2021-09-15 – 2021-09-19 (×6): 40 mg via ORAL
  Filled 2021-09-15 (×7): qty 1

## 2021-09-15 MED ORDER — VITAMIN B-12 1000 MCG PO TABS
1000.0000 ug | ORAL_TABLET | Freq: Every day | ORAL | Status: DC
  Administered 2021-09-16 – 2021-09-19 (×3): 1000 ug via ORAL
  Filled 2021-09-15 (×4): qty 1

## 2021-09-15 NOTE — Assessment & Plan Note (Deleted)
UA/culture pending collection ?

## 2021-09-15 NOTE — Progress Notes (Signed)
?  [facility]   ? ?Date of Admission:  08/31/2021   Total days of antibiotics 8 ?        ? ? ?ID: Terri [name] is a 45 y.o. female with cirrhosis complicated course -found to have hepatic abscess, soft skin tissue infection of left hand and left great toe gangrenous changes 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 with septic emboli. Aspirate cultures growing + MSSA and bacteroides in addition to GIB, candidal esophagitis ?Principal Problem: ?  Acute upper GI bleed ?Active Problems: ?  Essential hypertension ?  Hypokalemia ?  Polysubstance abuse ([place]) ?  Acute on chronic blood loss anemia ?  HAP (hospital-acquired pneumonia) ?  Portal vein thrombosis ?  Lactic acidosis ?  Hepatic lesion ?  Candida infection, esophageal ([place]) ?  Hepatic abscess ?  Acute back pain ?  Swelling of left hand ?  Gangrene of toe of left foot ([place]) ?  Staph aureus infection ?  Ascites ?  Exudative pleural effusion ?  Hypophosphatemia ?  Vaginal discharge ?  Acute respiratory failure with hypoxia ([place]) ?  Dysuria ? ? ? ?Subjective: ?Afebrile, TEE postponed due to increase oxygen needs. Patient wearing nasal cannula on forehead..unclear if she needs as much as 5L [place] . Not tachypnic ? ?Medications:  ? amLODipine  10 mg Oral Daily  ? Chlorhexidine Gluconate Cloth  6 each Topical Daily  ? fluconazole  200 mg Oral Daily  ? folic acid  1 mg Oral Daily  ? guaiFENesin  600 mg Oral BID  ? hydrALAZINE  50 mg Oral BID  ? lidocaine  1 patch Transdermal Daily  ? lisinopril  10 mg Oral QHS  ? magnesium oxide  400 mg Oral BID  ? metroNIDAZOLE  500 mg Oral Q12H  ? nicotine  21 mg Transdermal Daily  ? pantoprazole  40 mg Oral BID  ? sucralfate  1 g Oral TID WC & HS  ? [START ON 09/16/2021] vitamin B-12  1,000 mcg Oral Daily  ? ? ?Objective: ?Vital signs in last 24 hours: ?Temp:  [97 ?F (36.1 ?C)-99 ?F (37.2 ?C)] 98.2 ?F (36.8 ?C) (04/25 0400) ?Pulse Rate:  [88-107] 106 (04/25 0800) ?Resp:  [15-22] 22 (04/25 0800) ?BP: (97-146)/(65-104) 97/72  (04/25 0800) ?SpO2:  [90 %-100 %] 98 % (04/25 0800) ? ?Physical Exam  ?Constitutional:  oriented to person, place, and time. appears chronically ill, older than stated age and under-nourished. No distress.  ?HENT: [place]/AT, PERRLA, no scleral icterus ?Mouth/Throat: Oropharynx is clear and moist. No oropharyngeal exudate.  ?Cardiovascular: Normal rate, regular rhythm and normal heart sounds. Exam reveals no gallop and no friction rub.  ?No murmur heard.  ?Pulmonary/Chest: Effort normal and breath sounds normal. No respiratory distress.  has no wheezes.  ?Neck = supple, no nuchal rigidity ?Abdominal: Soft. Bowel sounds are normal.  exhibits no distension. There is no tenderness.  ?Lymphadenopathy: no cervical adenopathy. No axillary adenopathy ?Neurological: alert and oriented to person, place, and time.  ?Skin: Skin is warm and dry. Left great toe with distal eschar and erythema. ?Psychiatric: a normal mood and affect.  behavior is normal.  ? ? ?Lab Results ?Recent Labs  ?  09/13/21 ?0321 09/14/21 ?2353  ?WBC 11.6* 7.4  ?HGB 10.4* 7.8*  ?HCT 31.2* 23.8*  ?NA 132* 133*  ?K 3.6 3.8  ?CL 104 103  ?CO2 23 25  ?BUN 11 13  ?CREATININE 0.39* 0.30*  ? ?Liver Panel ?Recent Labs  ?  09/13/21 ?0321 09/14/21 ?6144  ?  PROT 4.4* 4.3*  ?ALBUMIN <1.5* <1.5*  ?AST 18 21  ?ALT 9 <5  ?ALKPHOS 180* 169*  ?BILITOT 0.4 0.6  ? ?Lab Results  ?Component Value Date  ? ESRSEDRATE 20 02/26/2014  ? ?Lab Results  ?Component Value Date  ? CRP 1.2 (H) 05/29/2021  ? ? ? ?Microbiology: ?4/17 hepatic abscess = mssa and bacteroides ?4/18 blood cx =ngtd ? ?Studies/Results: ?DG CHEST PORT 1 VIEW ? ?Result Date: 09/15/2021 ?CLINICAL DATA:  Shortness of breath. EXAM: PORTABLE CHEST 1 VIEW COMPARISON:  September 13, 2021 FINDINGS: The heart size and mediastinal contours are within normal limits. Grossly stable right upper lobe pneumonia is noted. Mild left basilar atelectasis or infiltrate is noted as well. The visualized skeletal structures are unremarkable.  IMPRESSION: Grossly stable right upper lobe pneumonia. Stable mild left basilar atelectasis or infiltrate. Electronically Signed   By: Marijo Conception M.D.   On: 09/15/2021 08:21   ? ? 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 Recommend MRI of left foot to see if need surgical debridement. Still would benefit from TEE to see if evidence of endocarditis. ? ?Hepatic abscess = continue on cefazolin plus metronidazole ? ?Esophageal candidiasis = continue on fluconazole '200mg'$  daily ? ?Carlyle Basques ?[facility] ?Pager: [phone] ? ?09/15/2021, 2:00 PM ? ? ? ? ? ?

## 2021-09-15 NOTE — Assessment & Plan Note (Deleted)
See staph infection ?

## 2021-09-15 NOTE — Progress Notes (Signed)
?PROGRESS NOTE ? ? ? ?Terri [name]  KGU:[id number] DOB: 1975/12/05 DOA: 08/31/2021 ?PCP: Pcp, No  ?Chief Complaint  ?Patient presents with  ? Rectal Bleeding  ? ? ?Brief Narrative:  ?46 year old female with history of acute upper GI bleed in setting of peptic ulcer disease, acute lower GI bleeding 2021, essential hypertension, chronic blood loss anemia, polysubstance abuse, portal vein thrombosis was admitted to [facility] for acute GI bleed after presenting from home complaining of dark-colored stool.  Gastroenterology was consulted, EGD on 09/03/2021 showed esophageal plaques consistent with candidiasis, large cratered nonbleeding duodenal ulcer with no stigmata of bleeding.  CT angiogram on 08/31/2021 showed multiple low-attenuation lesions throughout the liver seen on recent prior exam, highly concerning for hepatic abscesses or metastasis.  She's now s/p IR US guided bx and aspiration, which were revealing for MSSA abscess.  Workup is currently underway for suspected endocarditis, s/p TTE which was unrevealing, TEE is currently pending.  Blood cultures are no growth.  ID is following appreciate recommendations.  Her hospitalization has been complicated by dry gangrene of L first toe (imaging pending to r/o osteo), exudative pleural effusion, ascites, and pneumonia.  She also has Terri [name] portal vein thrombosis, not currently being anticoagulated due to recent GI bleeding. ? ?See below for additional details  ? ? ?Assessment & Plan: ?  ?Principal Problem: ?  Acute upper GI bleed ?Active Problems: ?  Acute on chronic blood loss anemia ?  Staph aureus infection ?  Hepatic abscess ?  Swelling of left hand ?  Gangrene of toe of left foot ([place]) ?  HAP (hospital-acquired pneumonia) ?  Portal vein thrombosis ?  Exudative pleural effusion ?  Ascites ?  Vaginal discharge ?  Dysuria ?  Hypokalemia ?  Polysubstance abuse ([place]) ?  Hypophosphatemia ?  Essential hypertension ?  Candida infection, esophageal ([place]) ?  Lactic  acidosis ?  Hepatic lesion ?  Acute back pain ?  Acute respiratory failure with hypoxia ([place]) ? ? ?Assessment and Plan: ?* Acute upper GI bleed ?Patient had 2 episodes of melena at home ?Gastroenterology consulted ?Underwent EGD on 09/03/2021 ?EGD 4/13 with esophageal plaques c/w candidiasis, large cratered non bleeding jejunal ulcer with non stigmata of bleeding.  Nodular regions more like regenerative tissue and not like visible vessels. ?GI recommended PPI, add sucralfate, add fluconazole ?GI now signed off, recommending PPI BID x2 months, sucralfate QID x2 weeks (now signed off) ?Will continue to trend Hb, stable ? ?Staph aureus infection ?ID following, 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 ?CTA abdomen/pelvis showed multiple low-attenuation lesions throughout the liver, highly concerning for hepatic abscesses or metastasis ?MRI liver showed findings concerning for metastases with tumor thrombus (abscesses within ddx) ?IR was consulted for liver biopsy/aspiration ?- bx 4/17 showed acute inflammation with necrosis compatible with abscess, granulomatous inflammation with eosinophilia and fragment of foreign material, negative for malignancy ?- aspiration 4/17 with abundant staph aureus (MSSA), abundant bacteroides oralis ?ID c/s, appreciate assistance -> given staph in liver, L hand/wrist swelling, left big toe dry gangrene redness 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 -> workup underway  ?4/19 echo with EF 60-65%.  TEE pending at this time (deferred with resp status at this time, on 6 L). ?MRI lumbar and thoracic spine without discitis/osteomyelitis in thoracolumbar spine ?-pain management ?-CA 19-9, CEA, AFP are all unremarkable ?-GI recommends to repeat CT with contrast or MRI of liver with and without contrast in 4 weeks to confirm that liver lesions were abscess and not metastasis ?Continue antibiotics per ID, currently on ancef,  flagyl.  Plan for 6 weeks abx. ? ? ?Acute on chronic blood loss anemia ?Continue to trend H/H ?Due to GI  bleeding ?Schistocytes, target cells, ovalocytes, burr cells seen yesterday ?Normal haptoglobin, normal LDH ?Follow smear  ?Labs show iron def and folate def.  B12 is pending but has history of B12 deficiency.   ? ? ?Hepatic abscess ?See staph infection ? ?Swelling of left hand ?MRI limited, no fluid collection or tenosynovitis seen ? ?Gangrene of toe of left foot ([place]) ?Seen by Dr. Scot Dock on 4/15, noted dry gangrene on tip of L great toe, palpable pedal pulses, thought to have adequate circulation to heal this (unclear etiology per vascular).  Recommended outpatient follow up, call back as needed. ?MRI pending ? ?Exudative pleural effusion ?CXR 4/22 with layering R effusion and R mid and lower lung infiltrate vs atelectasis ?Thoracentesis 4/23 with 1.2 L of turbid light yellow fluid ?Post thora CXR with severe multilobar bilateral pneumonia, most pronounced in R mid lung, needs f/u PA/lateral in 3-4 weeks following abx therapy to ensure resolution ?Exudative based on pleural fluid LDH/serum LDH ?Follow body fluid culture (no growth) and gram stain (no organisms) ?Follow cytology ? ?Portal vein thrombosis ?With recent GI bleeding, will hold on anticoagulation ?Large burden of thrombus in central portal vein as well as R portal vein and branches\ ?Will discuss with GI, consider starting anticoagulation based on recs ? ? ?HAP (hospital-acquired pneumonia) ?RUL pneumonia on CXR 4/25 ?Requiring 6 L [place] ?Will continue ancef and flagyl for now ?Sputum cx, MRSA PCR ?Urine strep, legionella ? ? ? ?Dysuria ?UA/culture pending collection ? ?Vaginal discharge ?Wet prep and gc/chlam pending ? ?Ascites ?2 L removed during liver bx, but analysis not performed at that point ?Repeat US didn't show enough fluid for repeat para/analysis ? ?Hypophosphatemia ?Replace and follow ? ?Polysubstance abuse ([place]) ?Encourage cessation ?UDS with cocaine ? ? ? ?Hypokalemia ?Replace and follow ? ?Essential hypertension ?Continue lisinopril,  amlodipine, hydral ?Labetalol prn ? ?Candida infection, esophageal ([place]) ?"Esophageal plaques were found, consistent with candidiasis" ?-- continue fluconazole ?-- check HIV nonreactive ?-- negative acute hepatitis panel  ? ?Hepatic lesion ? ? ? ? ? ? ?Lactic acidosis ? ? ? ? ?DVT prophylaxis: SCD ?Code Status: full ?Family Communication: none ?Disposition:  ? ?Status is: Inpatient ?Remains inpatient appropriate because: need for additional w/u IV abx ?  ?Consultants:  ?ID ?GI ?IR ?vascular ? ?Procedures:  ?Thoracentesis 4/23 ?Upper endoscopy ?US guided core liver bx, aspiration of liver lesion for cx, US paracentesis ?Echo ?IMPRESSIONS  ? ? ? 1. Limited echo windows due to body habitus.  ? 2. Left ventricular ejection fraction, by estimation, is 60 to 65%. The  ?left ventricle has normal function. The left ventricle has no regional  ?wall motion abnormalities. Left ventricular diastolic parameters were  ?normal.  ? 3. Right ventricular systolic function is normal. The right ventricular  ?size is normal. There is moderately elevated pulmonary artery systolic  ?pressure.  ? 4. Whitney Bingaman small pericardial effusion is present. The pericardial effusion is  ?posterior to the left ventricle and anterior to the right ventricle.  ? 5. The mitral valve is abnormal. No evidence of mitral valve  ?regurgitation. No evidence of mitral stenosis. Moderate mitral annular  ?calcification.  ? 6. The aortic valve is normal in structure. There is mild calcification  ?of the aortic valve. There is mild thickening of the aortic valve. Aortic  ?valve regurgitation is trivial. Aortic valve sclerosis is present, with no  ?evidence of  aortic valve  ?stenosis.  ? 7. The inferior vena cava is normal in size with greater than 50%  ?respiratory variability, suggesting right atrial pressure of 3 mmHg.  ? ?ABI ?Summary:  ?Right: Resting right ankle-brachial index is within normal range. No  ?evidence of significant right lower extremity arterial disease.  The right  ?toe-brachial index is abnormal.  ? ?Left: Resting left ankle-brachial index is within normal range. No  ?evidence of significant left lower extremity arterial disease.  ? ?Antimicrobials:  ?Anti-infecti

## 2021-09-15 NOTE — Progress Notes (Signed)
Patient refused labs ordered earlier this shift, explained to patient why they're needed and lab attempted x 2, but patient continues to refuse.  Patient made aware of urine specimens and sputum sample needed.  Patient denies any urge to void at this time, all purewick equipment changed.   ?

## 2021-09-15 NOTE — Progress Notes (Signed)
Pt. has no urine output recorded since yesterday.Bladder scan greater than 800. Order obtained from on-call provider to do I&O cath. ? ?Attempted to I&O cath pt with the help of Nurse Tech but failed. Pt. refuses to have the catheter re-inserted the second time and was sobbing inconsolably.  Pt. said that she will just try urinate on her own. Provider is aware. Will continue to monitor. ?

## 2021-09-15 NOTE — Evaluation (Signed)
Clinical/Bedside Swallow Evaluation ?Patient Details  ?Name: Terri [name] ?MRN: [id number] ?Date of Birth: July 05, 1975 ? ?Today's Date: 09/15/2021 ?Time: SLP Start Time (ACUTE ONLY): 4481 SLP Stop Time (ACUTE ONLY): 8563 ?SLP Time Calculation (min) (ACUTE ONLY): 11 min ? ?Past Medical History:  ?Past Medical History:  ?Diagnosis Date  ? Arthritis   ? Back and legs  ? Chronic back pain   ? DVT (deep venous thrombosis) ([place])   ? early 20's leg  ? GIB (gastrointestinal bleeding) 09/13/2017  ? History of blood transfusion   ? Hypertension   ? Migraine headache   ? Mood disorder ([place])   ? previously documented as bipolar and MDD  ? Neuropathy   ? Pneumonia   ? 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 Sciatica   ? Stress incontinence   ? ?Past Surgical History:  ?Past Surgical History:  ?Procedure Laterality Date  ? ALVEOLOPLASTY Bilateral 01/31/2018  ? Procedure: ALVEOLOPLASTY;  Surgeon: Diona Browner, DDS;  Location: [facility];  Service: Oral Surgery;  Laterality: Bilateral;  ? ANKLE SURGERY Left 1992  ? with hardware  ? BACK SURGERY    ? BIOPSY  02/08/2019  ? Procedure: BIOPSY;  Surgeon: Irving Copas., MD;  Location: [facility];  Service: Gastroenterology;;  ? CESAREAN SECTION    ? CHOLECYSTECTOMY N/A 02/15/2017  ? Procedure: LAPAROSCOPIC CHOLECYSTECTOMY;  Surgeon: Clovis Riley, MD;  Location: [facility];  Service: General;  Laterality: N/A;  ? ENTEROSCOPY N/A 02/08/2019  ? Procedure: ENTEROSCOPY;  Surgeon: Rush Landmark Telford Nab., MD;  Location: [facility];  Service: Gastroenterology;  Laterality: N/A;  ? ESOPHAGOGASTRODUODENOSCOPY N/A 01/17/2021  ? Procedure: ESOPHAGOGASTRODUODENOSCOPY (EGD);  Surgeon: Juanita Craver, MD;  Location: [facility];  Service: Endoscopy;  Laterality: N/A;  ? ESOPHAGOGASTRODUODENOSCOPY N/A 09/03/2021  ? Procedure: ESOPHAGOGASTRODUODENOSCOPY (EGD);  Surgeon: Arta Silence, MD;  Location: [facility];  Service: Gastroenterology;  Laterality: N/A;  ? ESOPHAGOGASTRODUODENOSCOPY (EGD) WITH  PROPOFOL N/A 09/15/2017  ? Procedure: ESOPHAGOGASTRODUODENOSCOPY (EGD) WITH PROPOFOL;  Surgeon: Clarene Essex, MD;  [facility]: [facility];  Service: Endoscopy;  Laterality: N/A;  ? ESOPHAGOGASTRODUODENOSCOPY (EGD) WITH PROPOFOL N/A 02/08/2019  ? Procedure: ESOPHAGOGASTRODUODENOSCOPY (EGD) WITH PROPOFOL;  Surgeon: Rush Landmark Telford Nab., MD;  Location: [facility];  Service: Gastroenterology;  Laterality: N/A;  ? GASTRIC BYPASS    ? INCISION AND DRAINAGE OF PERITONSILLAR ABCESS Left 08/13/2017  ? Procedure: INCISION AND DRAINAGE OF LEFT NECK ABSCESS;  Surgeon: Melida Quitter, MD;  Location: [facility];  Service: ENT;  Laterality: Left;  ? LAPAROSCOPIC LYSIS OF ADHESIONS  02/15/2017  ? Procedure: LAPAROSCOPIC LYSIS OF ADHESIONS;  Surgeon: Clovis Riley, MD;  Location: [facility];  Service: General;;  ? LUMBAR [place]    ? LUMBAR FUSION    ? SAVORY DILATION N/A 02/08/2019  ? Procedure: SAVORY DILATION;  Surgeon: Irving Copas., MD;  Location: [facility];  Service: Gastroenterology;  Laterality: N/A;  ? TOOTH EXTRACTION Bilateral 01/31/2018  ? Procedure: DENTAL RESTORATION/EXTRACTIONS;  Surgeon: Diona Browner, DDS;  Location: [facility];  Service: Oral Surgery;  Laterality: Bilateral;  ? TUBAL LIGATION    ? UPPER GI ENDOSCOPY N/A 02/15/2017  ? Procedure: UPPER GI ENDOSCOPY;  Surgeon: Clovis Riley, MD;  Location: [facility];  Service: General;  Laterality: N/A;  ? ?HPI:  ?Gastroenterology was consulted, EGD on 09/03/2021 showed esophageal plaques consistent with candidiasis, large cratered nonbleeding duodenal ulcer with no stigmata of bleeding.  bx 4/17 showed acute inflammation with necrosis compatible with abscess, granulomatous inflammation  with eosinophilia and fragment of foreign material, negative for malignancy;  CXR Grossly stable right upper lobe pneumonia. Stable mild left basilar atelectasis or infiltrate., incr ox needs thus TEE deferred, 4/23  thora and s/p paracentesis 4/17; ? Endocarditis; cocaine +  ?   ?Assessment / Plan / Recommendation  ?Clinical Impression ? Pt greeted lying back in bed, reports discomfort - and is aware she can have more pain medications at 1230 pm.  Pt CN exam unremarkable, she appears cachectic and voice is mildly weak/dysphonic.  Minimal dysarthria noted but speech is intelligible. Pt accepted only 3 ounce water challange - which she easily passed.     She refused to accept anything else due to her abdomen discomfort - denies carafate helping.  Recommend continue regular/thin diet - advised pt to general aspiration precautions as she only allowed [facility] elevated to approx 45*.  No SLP follow up indicated. ?  ?   ?Aspiration Risk ? Mild aspiration risk  ?  ?Diet Recommendation Regular;Thin liquid  ? ?Liquid Administration via: Straw;Cup ?Medication Administration: Whole meds with liquid ?Supervision: Patient able to self feed ?Compensations: Slow rate;Small sips/bites ?Postural Changes: Seated upright at 90 degrees  ?  ?Other  Recommendations Oral Care Recommendations: Oral care BID   ? ?Recommendations for follow up therapy are one component of a multi-disciplinary discharge planning process, led by the attending physician.  Recommendations may be updated based on patient status, additional functional criteria and insurance authorization. ? ?Follow up Recommendations No SLP follow up  ? ? ?  ?Assistance Recommended at Discharge None  ?Functional Status Assessment Patient has not had a recent decline in their functional status  ?Frequency and Duration   N/a ?  ?  ?   ? ?Prognosis   N/a ? ?  ? ?Swallow Study   ?General Date of Onset: 09/15/21 ?HPI: Gastroenterology was consulted, EGD on 09/03/2021 showed esophageal plaques consistent with candidiasis, large cratered nonbleeding duodenal ulcer with no stigmata of bleeding.  bx 4/17 showed acute inflammation with necrosis compatible with abscess, granulomatous inflammation with eosinophilia and fragment of foreign material, negative for  malignancy;  CXR Grossly stable right upper lobe pneumonia. Stable mild left basilar atelectasis or infiltrate., incr ox needs thus TEE deferred, 4/23  thora and s/p paracentesis 4/17; ? Endocarditis; cocaine + ?Type of Study: Bedside Swallow Evaluation ?Diet Prior to this Study: Regular;Thin liquids ?Temperature Spikes Noted: No ?Respiratory Status: Nasal cannula ?History of Recent Intubation: No ?Behavior/Cognition: Alert;Cooperative;Pleasant mood ?Oral Cavity Assessment: Within Functional Limits ?Oral Care Completed by SLP: No ?Oral Cavity - Dentition: Edentulous ?Vision: Functional for self-feeding ?Self-Feeding Abilities: Able to feed self ?Patient Positioning: Other (comment) ?Baseline Vocal Quality: Normal ?Volitional Cough: Other (Comment) (did not perform) ?Volitional Swallow: Unable to elicit  ?  ?Oral/Motor/Sensory Function Overall Oral Motor/Sensory Function: Within functional limits   ?Ice Chips Ice chips: Not tested   ?Thin Liquid Thin Liquid: Within functional limits ?Presentation: Cup ?Other Comments: 3 ounce [place] water screen conducted  ?  ?Nectar Thick Nectar Thick Liquid: Not tested   ?Honey Thick Honey Thick Liquid: Not tested   ?Puree Puree: Not tested ?Other Comments: pt refused   ?Solid ? ? ?  Solid: Not tested ?Other Comments: pt refused  ? ?  ? ?Macario Golds ?09/15/2021,12:00 PM ? ?[name], MS CCC SLP ?Acute Rehab Services ?Office [phone] ?Pager [phone] ? ? ?

## 2021-09-15 NOTE — Progress Notes (Signed)
? ?  Patient required more oxygen support today per primary team, wish to defer TEE which is reasonable, TEE cancelled, please call cardiology if wish to re-schedule.  ?

## 2021-09-15 NOTE — Assessment & Plan Note (Addendum)
--   CT showed very large areas of consolidation throughout the right upper lobe, right middle lobe and right lower lobe, with marked severity multifocal bilateral infiltrates.  ?-- Continues to require high flow nasal cannula but oxygen requirement somewhat decreased.  Remains tachypneic and dyspneic with supraclavicular retractions.  Continue empiric antibiotics.  Appreciate ID.  Cannot rule out aspiration, although may represent disseminated MSSA complication.  Speech therapy evaluation pending. ?

## 2021-09-16 ENCOUNTER — Encounter (HOSPITAL_COMMUNITY)
Admission: EM | Disposition: A | Discharge status: Skilled Nursing Facility | Payer: Self-pay | Source: Home / Self Care | Attending: Family Medicine

## 2021-09-16 ENCOUNTER — Inpatient Hospital Stay (HOSPITAL_COMMUNITY): Payer: Medicaid Other

## 2021-09-16 ENCOUNTER — Encounter (HOSPITAL_COMMUNITY): Payer: Self-pay | Admitting: Internal Medicine

## 2021-09-16 DIAGNOSIS — A4901 Methicillin susceptible Staphylococcus aureus infection, unspecified site: Secondary | ICD-10-CM | POA: Diagnosis not present

## 2021-09-16 DIAGNOSIS — M869 Osteomyelitis, unspecified: Secondary | ICD-10-CM | POA: Diagnosis not present

## 2021-09-16 DIAGNOSIS — K922 Gastrointestinal hemorrhage, unspecified: Secondary | ICD-10-CM | POA: Diagnosis not present

## 2021-09-16 DIAGNOSIS — B3781 Candidal esophagitis: Secondary | ICD-10-CM | POA: Diagnosis not present

## 2021-09-16 DIAGNOSIS — K750 Abscess of liver: Secondary | ICD-10-CM | POA: Diagnosis not present

## 2021-09-16 DIAGNOSIS — L02619 Cutaneous abscess of unspecified foot: Secondary | ICD-10-CM

## 2021-09-16 HISTORY — PX: AMPUTATION: SHX166

## 2021-09-16 LAB — BODY FLUID CULTURE W GRAM STAIN
Culture: NO GROWTH
Gram Stain: NONE SEEN

## 2021-09-16 LAB — STREP PNEUMONIAE URINARY ANTIGEN: Strep Pneumo Urinary Antigen: NEGATIVE

## 2021-09-16 SURGERY — AMPUTATION, FOOT, RAY
Anesthesia: General | Site: Toe | Laterality: Left

## 2021-09-16 MED ORDER — MIDAZOLAM HCL 2 MG/2ML IJ SOLN
INTRAMUSCULAR | Status: AC
Start: 2021-09-16 — End: ?
  Filled 2021-09-16: qty 2

## 2021-09-16 MED ORDER — HYDROCODONE-ACETAMINOPHEN 5-325 MG PO TABS
1.0000 | ORAL_TABLET | ORAL | Status: DC | PRN
  Administered 2021-09-16 – 2021-09-17 (×3): 2 via ORAL
  Filled 2021-09-16 (×3): qty 2

## 2021-09-16 MED ORDER — FENTANYL CITRATE (PF) 250 MCG/5ML IJ SOLN
INTRAMUSCULAR | Status: AC
  Filled 2021-09-16: qty 5

## 2021-09-16 MED ORDER — MIRTAZAPINE 15 MG PO TABS
7.5000 mg | ORAL_TABLET | Freq: Every day | ORAL | Status: DC
  Administered 2021-09-17 – 2021-09-18 (×2): 7.5 mg via ORAL
  Filled 2021-09-16 (×2): qty 1

## 2021-09-16 MED ORDER — HYDROMORPHONE HCL 1 MG/ML IJ SOLN
1.0000 mg | INTRAMUSCULAR | Status: DC | PRN
  Administered 2021-09-16 – 2021-09-17 (×4): 1 mg via INTRAVENOUS
  Filled 2021-09-16 (×4): qty 1

## 2021-09-16 MED ORDER — ONDANSETRON HCL 4 MG/2ML IJ SOLN
INTRAMUSCULAR | Status: AC
  Filled 2021-09-16: qty 2

## 2021-09-16 MED ORDER — VANCOMYCIN HCL 1000 MG IV SOLR
INTRAVENOUS | Status: AC
  Filled 2021-09-16: qty 20

## 2021-09-16 MED ORDER — PHENYLEPHRINE 80 MCG/ML (10ML) SYRINGE FOR IV PUSH (FOR BLOOD PRESSURE SUPPORT)
PREFILLED_SYRINGE | INTRAVENOUS | Status: AC
  Filled 2021-09-16: qty 10

## 2021-09-16 MED ORDER — GADOBUTROL 1 MMOL/ML IV SOLN
6.0000 mL | Freq: Once | INTRAVENOUS | Status: AC | PRN
  Administered 2021-09-16: 6 mL via INTRAVENOUS

## 2021-09-16 MED ORDER — ACETAMINOPHEN 10 MG/ML IV SOLN
INTRAVENOUS | Status: AC
  Filled 2021-09-16: qty 100

## 2021-09-16 MED ORDER — POVIDONE-IODINE 10 % EX SWAB: 2.0000 "application " | Freq: Once | CUTANEOUS | Status: DC

## 2021-09-16 MED ORDER — DEXAMETHASONE SODIUM PHOSPHATE 10 MG/ML IJ SOLN
INTRAMUSCULAR | Status: AC
  Filled 2021-09-16: qty 1

## 2021-09-16 MED ORDER — PROPOFOL 10 MG/ML IV BOLUS
INTRAVENOUS | Status: AC
  Filled 2021-09-16: qty 20

## 2021-09-16 MED ORDER — CHLORHEXIDINE GLUCONATE 4 % EX LIQD
60.0000 mL | Freq: Once | CUTANEOUS | Status: DC
  Filled 2021-09-16: qty 60

## 2021-09-16 SURGICAL SUPPLY — 50 items
APL PRP STRL LF DISP 70% ISPRP (MISCELLANEOUS) ×1
BAG COUNTER SPONGE SURGICOUNT (BAG) IMPLANT
BAG SPEC THK2 15X12 ZIP CLS (MISCELLANEOUS) ×1
BAG SPNG CNTER NS LX DISP (BAG)
BAG ZIPLOCK 12X15 (MISCELLANEOUS) ×2 IMPLANT
BLADE OSCILLATING/SAGITTAL (BLADE) ×2
BLADE SURG 15 STRL LF DISP TIS (BLADE) ×3 IMPLANT
BLADE SURG 15 STRL SS (BLADE) ×6
BLADE SW THK.38XMED LNG THN (BLADE) ×1 IMPLANT
BNDG CMPR 9X4 STRL LF SNTH (GAUZE/BANDAGES/DRESSINGS) ×1
BNDG ELASTIC 4X5.8 VLCR STR LF (GAUZE/BANDAGES/DRESSINGS) ×1 IMPLANT
BNDG ESMARK 4X9 LF (GAUZE/BANDAGES/DRESSINGS) ×2 IMPLANT
CHLORAPREP W/TINT 26 (MISCELLANEOUS) ×2 IMPLANT
CNTNR URN SCR LID CUP LEK RST (MISCELLANEOUS) IMPLANT
CONT SPEC 4OZ STRL OR WHT (MISCELLANEOUS)
COVER SURGICAL LIGHT HANDLE (MISCELLANEOUS) ×2 IMPLANT
CUFF TOURN SGL QUICK 24 (TOURNIQUET CUFF) ×2
CUFF TRNQT CYL 24X4X16.5-23 (TOURNIQUET CUFF) IMPLANT
DRAPE C-ARM 42X120 X-RAY (DRAPES) IMPLANT
DRAPE EXTREMITY T 121X128X90 (DISPOSABLE) ×2 IMPLANT
DRAPE OEC MINIVIEW 54X84 (DRAPES) ×2 IMPLANT
DRAPE SHEET LG 3/4 BI-LAMINATE (DRAPES) ×2 IMPLANT
DRAPE SURG 17X11 SM STRL (DRAPES) ×4 IMPLANT
DRAPE U-SHAPE 47X51 STRL (DRAPES) ×4 IMPLANT
DRSG ADAPTIC 3X8 NADH LF (GAUZE/BANDAGES/DRESSINGS) ×2 IMPLANT
DRSG KUZMA FLUFF (GAUZE/BANDAGES/DRESSINGS) ×1 IMPLANT
DRSG PAD ABDOMINAL 8X10 ST (GAUZE/BANDAGES/DRESSINGS) ×1 IMPLANT
ELECT REM PT RETURN 15FT ADLT (MISCELLANEOUS) ×2 IMPLANT
GAUZE SPONGE 4X4 12PLY STRL (GAUZE/BANDAGES/DRESSINGS) ×2 IMPLANT
GLOVE BIO SURGEON STRL SZ8 (GLOVE) ×2 IMPLANT
GLOVE BIOGEL PI IND STRL 7.5 (GLOVE) ×1 IMPLANT
GLOVE BIOGEL PI IND STRL 8 (GLOVE) ×1 IMPLANT
GLOVE BIOGEL PI INDICATOR 7.5 (GLOVE) ×4
GLOVE BIOGEL PI INDICATOR 8 (GLOVE) ×1
GLOVE SS BIOGEL STRL SZ 7.5 (GLOVE) ×1 IMPLANT
GLOVE SUPERSENSE BIOGEL SZ 7.5 (GLOVE) ×1
GOWN STRL REUS W/ TWL LRG LVL3 (GOWN DISPOSABLE) ×1 IMPLANT
GOWN STRL REUS W/TWL LRG LVL3 (GOWN DISPOSABLE) ×2
KIT BASIN OR (CUSTOM PROCEDURE TRAY) ×2 IMPLANT
KIT TURNOVER KIT A (KITS) IMPLANT
MARKER SKIN DUAL TIP RULER LAB (MISCELLANEOUS) ×2 IMPLANT
PACK TOTAL JOINT (CUSTOM PROCEDURE TRAY) ×2 IMPLANT
PAD CAST 4YDX4 CTTN HI CHSV (CAST SUPPLIES) ×1 IMPLANT
PADDING CAST COTTON 4X4 STRL (CAST SUPPLIES) ×2
PROTECTOR NERVE ULNAR (MISCELLANEOUS) ×4 IMPLANT
SET IRRIG Y TYPE TUR BLADDER L (SET/KITS/TRAYS/PACK) ×2 IMPLANT
STOCKINETTE 8 INCH (MISCELLANEOUS) ×2 IMPLANT
SUT ETHILON 3 0 PS 1 (SUTURE) ×3 IMPLANT
SUT PDS AB 2-0 CT2 27 (SUTURE) ×1 IMPLANT
SUT PDS AB 3-0 SH 27 (SUTURE) ×1 IMPLANT

## 2021-09-16 NOTE — Progress Notes (Signed)
Bedside RN received phone call from patient's sister who is listed in patient chart. Sister stated that patient had expressed to her that she wants to kill herself. Bedside RN alerted MD and CN. CN and bedside RN completed suicide risk assessment with patient. Patient verbalized that she does wish to die and also had those thoughts at home. Patient denied any plan. AC made aware of the above.  ? ?Based on suicide risk assessment and hospital protocol, suicidal precautions were put in place per MD order. Patient belongings were removed and locked in designated cabinet. Telesitter was ordered per policy. AC notified that telesitter has been initiated until nurse tech sitter arrives to monitor patient. ?

## 2021-09-16 NOTE — H&P (Signed)
H&P Update: ? ?-History and Physical Reviewed ? ?-Patient has been re-examined ? ?-No change in the plan of care ? ?-The risks and benefits were presented and reviewed. The risks due to new/persistent infection, stiffness, nerve/vessel/tendon injury, wound healing issues, development of arthritis, failure of this surgery, possibility of delayed definitive closure, need for further surgery, need for future skin graft/flap/other reconstruction, thromboembolic events, anesthesia/medical complications, amputation, death among others were discussed. The patient acknowledged the explanation, agreed to proceed with the plan and a consent was signed. ? ?Terri [name] ? ?

## 2021-09-16 NOTE — Consult Note (Addendum)
? ? ?Patient ID: ?Terri [name] ?MRN: [id number] ?DOB/AGE: [birth year] 46 y.o. ? ?Admit date: 08/31/2021 ? ?Admission Diagnoses:  ?Principal Problem: ?  Acute upper GI bleed ?Active Problems: ?  Essential hypertension ?  Hypokalemia ?  Polysubstance abuse ([place]) ?  Acute on chronic blood loss anemia ?  HAP (hospital-acquired pneumonia) ?  Portal vein thrombosis ?  Lactic acidosis ?  Hepatic lesion ?  Candida infection, esophageal ([place]) ?  Hepatic abscess ?  Acute back pain ?  Swelling of left hand ?  Gangrene of toe of left foot ([place]) ?  Staph aureus infection ?  Ascites ?  Exudative pleural effusion ?  Hypophosphatemia ?  Vaginal discharge ?  Acute respiratory failure with hypoxia ([place]) ?  Dysuria ? ? ?HPI: ?Ortho consult for necrotic left great toe. Patient reports this pain has been present and progressive over past several months and especially in the last couple of weeks. Her PMH is notable for polysubstance abuse, cirrhosis, undernourished found to have hepatic abscess with portal vein thrombosis, GIB, candidal esophagitis and presumed MSSA disseminated infection due to gangrenous left great toe and left hand skin lesion. She is currently admitted to hospitalist team for ongoing MSSA disseminated infection. ? ?Past Medical History: ?Past Medical History:  ?Diagnosis Date  ? Arthritis   ? Back and legs  ? Chronic back pain   ? DVT (deep venous thrombosis) ([place])   ? early 20's leg  ? GIB (gastrointestinal bleeding) 09/13/2017  ? History of blood transfusion   ? Hypertension   ? Migraine headache   ? Mood disorder ([place])   ? previously documented as bipolar and MDD  ? Neuropathy   ? Pneumonia   ? 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 Sciatica   ? Stress incontinence   ? ? ?Surgical History: ?Past Surgical History:  ?Procedure Laterality Date  ? ALVEOLOPLASTY Bilateral 01/31/2018  ? Procedure: ALVEOLOPLASTY;  Surgeon: Diona Browner, DDS;  Location: [facility];  Service: Oral Surgery;  Laterality: Bilateral;  ? ANKLE  SURGERY Left 1992  ? with hardware  ? BACK SURGERY    ? BIOPSY  02/08/2019  ? Procedure: BIOPSY;  Surgeon: Irving Copas., MD;  Location: [facility];  Service: Gastroenterology;;  ? CESAREAN SECTION    ? CHOLECYSTECTOMY N/A 02/15/2017  ? Procedure: LAPAROSCOPIC CHOLECYSTECTOMY;  Surgeon: Clovis Riley, MD;  Location: [facility];  Service: General;  Laterality: N/A;  ? ENTEROSCOPY N/A 02/08/2019  ? Procedure: ENTEROSCOPY;  Surgeon: Rush Landmark Telford Nab., MD;  Location: [facility];  Service: Gastroenterology;  Laterality: N/A;  ? ESOPHAGOGASTRODUODENOSCOPY N/A 01/17/2021  ? Procedure: ESOPHAGOGASTRODUODENOSCOPY (EGD);  Surgeon: Juanita Craver, MD;  Location: [facility];  Service: Endoscopy;  Laterality: N/A;  ? ESOPHAGOGASTRODUODENOSCOPY N/A 09/03/2021  ? Procedure: ESOPHAGOGASTRODUODENOSCOPY (EGD);  Surgeon: Arta Silence, MD;  Location: [facility];  Service: Gastroenterology;  Laterality: N/A;  ? ESOPHAGOGASTRODUODENOSCOPY (EGD) WITH PROPOFOL N/A 09/15/2017  ? Procedure: ESOPHAGOGASTRODUODENOSCOPY (EGD) WITH PROPOFOL;  Surgeon: Clarene Essex, MD;  [facility];  Service: Endoscopy;  Laterality: N/A;  ? ESOPHAGOGASTRODUODENOSCOPY (EGD) WITH PROPOFOL N/A 02/08/2019  ? Procedure: ESOPHAGOGASTRODUODENOSCOPY (EGD) WITH PROPOFOL;  Surgeon: Rush Landmark Telford Nab., MD;  Location: [facility];  Service: Gastroenterology;  Laterality: N/A;  ? GASTRIC BYPASS    ? INCISION AND DRAINAGE OF PERITONSILLAR ABCESS Left 08/13/2017  ? Procedure: INCISION AND DRAINAGE OF LEFT NECK ABSCESS;  Surgeon: Melida Quitter, MD;  [facility];  Service: ENT;  Laterality: Left;  ?  LAPAROSCOPIC LYSIS OF ADHESIONS  02/15/2017  ? Procedure: LAPAROSCOPIC LYSIS OF ADHESIONS;  Surgeon: Clovis Riley, MD;  [facility];  Service: General;;  ? LUMBAR [place]    ? LUMBAR FUSION    ? SAVORY DILATION N/A 02/08/2019  ? Procedure: SAVORY DILATION;  Surgeon: Irving Copas., MD;  Location: [facility];  Service:  Gastroenterology;  Laterality: N/A;  ? TOOTH EXTRACTION Bilateral 01/31/2018  ? Procedure: DENTAL RESTORATION/EXTRACTIONS;  Surgeon: Diona Browner, DDS;  Location: [facility];  Service: Oral Surgery;  Laterality: Bilateral;  ? TUBAL LIGATION    ? UPPER GI ENDOSCOPY N/A 02/15/2017  ? Procedure: UPPER GI ENDOSCOPY;  Surgeon: Clovis Riley, MD;  [facility];  Service: General;  Laterality: N/A;  ? ? ?Family History: ?Family History  ?Problem Relation Age of Onset  ? Diabetes Mother   ? Hypertension Mother   ? Diabetes Father   ? Hypertension Father   ? ? ?Social History: ?Social History  ? ?Socioeconomic History  ? Marital status: Divorced  ?  Spouse name: Not on file  ? Number of children: Not on file  ? Years of education: Not on file  ? Highest education level: Not on file  ?Occupational History  ? Not on file  ?Tobacco Use  ? Smoking status: Every Day  ?  Packs/day: 1.00  ?  Years: 29.00  ?  Pack years: 29.00  ?  Types: Cigarettes  ? Smokeless tobacco: Never  ?Vaping Use  ? Vaping Use: Never used  ?Substance and Sexual Activity  ? Alcohol use: No  ? Drug use: Yes  ?  Types: Cocaine, Heroin  ?  Comment: Patient states she smokes cocaine  ? Sexual activity: Yes  ?  Birth control/protection: None  ?Other Topics Concern  ? Not on file  ?Social History Narrative  ? Not on file  ? ?Social Determinants of Health  ? ?Financial Resource Strain: Not on file  ?Food Insecurity: Not on file  ?Transportation Needs: Not on file  ?Physical Activity: Not on file  ?Stress: Not on file  ?Social Connections: Not on file  ?Intimate Partner Violence: Not on file  ? ? ?Allergies: ?Nsaids and Ketorolac tromethamine ? ?Medications: ?I have reviewed the patient's current medications. ? ?Vital Signs: ?Patient Vitals for the past 24 hrs: ? BP Temp Temp src Pulse Resp SpO2 Weight  ?09/16/21 0601 108/61 98.3 ?F (36.8 ?C) Oral 92 18 100 % --  ?09/16/21 0500 -- -- -- -- -- -- 63.7 kg  ?09/15/21 1952 104/71 (!) 100.4 ?F (38 ?C) Oral 92 16 100 %  --  ? ? ?Radiology: ?DG Chest 1 View ? ?Result Date: 09/13/2021 ?CLINICAL DATA:  45 year old female status post thoracentesis. EXAM: CHEST  1 VIEW COMPARISON:  Chest x-ray 09/12/2021. FINDINGS: Previously noted right-sided pleural effusion has nearly completely resolved following thoracentesis. No appreciable pneumothorax. Extensive airspace consolidation is noted throughout the right lung, most evident in the right mid lung. Patchy ill-defined airspace disease also noted in the medial aspect of the left lung base. No left pleural effusion. No pneumothorax. No evidence of pulmonary edema. Heart size is normal. Upper mediastinal contours are within normal limits. IMPRESSION: 1. Near complete resolution of right-sided pleural effusion following thoracentesis. No postprocedural pneumothorax. 2. Severe multilobar bilateral pneumonia, most pronounced in the right mid lung. Followup PA and lateral chest X-ray is recommended in 3-4 weeks following trial of antibiotic therapy to ensure resolution and exclude underlying malignancy. Electronically Signed   By:  Vinnie Langton M.D.   On: 09/13/2021 11:28  ? ?DG Chest 1 View ? ?Result Date: 08/31/2021 ?CLINICAL DATA:  GI bleeding. EXAM: CHEST  1 VIEW COMPARISON:  Chest x-ray 08/25/2021. FINDINGS: The heart size and mediastinal contours are within normal limits. Both lungs are clear. Nipple shadow projects over the right lower lung. The visualized skeletal structures are unremarkable. IMPRESSION: No active disease. Electronically Signed   By: Ronney Asters M.D.   On: 08/31/2021 18:53  ? ?DG Chest 2 View ? ?Result Date: 09/12/2021 ?CLINICAL DATA:  Shortness of breath. EXAM: CHEST - 2 VIEW COMPARISON:  09/11/2021 FINDINGS: Moderate layering right pleural effusion appears increased since previous study. Increased infiltrate or atelectasis is seen in the right mid and lower lung. Left lung remains clear. Heart size is normal. IMPRESSION: Increased layering right pleural effusion and  right mid and lower lung infiltrate versus atelectasis. Electronically Signed   By: Marlaine Hind M.D.   On: 09/12/2021 11:22  ? ?DG Chest 2 View ? ?Result Date: 08/19/2021 ?CLINICAL DATA:  Shortness of breath. E

## 2021-09-16 NOTE — Anesthesia Preprocedure Evaluation (Addendum)
Anesthesia Evaluation  ?Patient identified by MRN, date of birth, ID band ?Patient awake ? ? ? ?Reviewed: ?Allergy & Precautions, NPO status , Patient's Chart, lab work & pertinent test results ? ?History of Anesthesia Complications ?Negative for: history of anesthetic complications ? ?Airway ?Mallampati: II ? ?TM Distance: >3 FB ?Neck ROM: Full ? ? ? Dental ? ?(+) Edentulous Upper, Edentulous Lower ?  ?Pulmonary ?asthma , Current Smoker and Patient abstained from smoking.,  ?  ?Pulmonary exam normal ? ? ? ? ? ? ? Cardiovascular ?hypertension, Normal cardiovascular exam ? ? ?  ?Neuro/Psych ? Headaches, Anxiety Depression   ? GI/Hepatic ?PUD, GERD  Medicated,(+)  ?  ? substance abuse ? cocaine use and IV drug use, Portal vein thrombosis, liver masses with ascites ?Recent UGIB ?  ?Endo/Other  ?negative endocrine ROS ? Renal/GU ?negative Renal ROS  ?negative genitourinary ?  ?Musculoskeletal ? ?(+) Arthritis , narcotic dependent ? Abdominal ?  ?Peds ? Hematology ? ?(+) Blood dyscrasia (Hgb 8.0, Plt 124k), anemia ,   ?Anesthesia Other Findings ?Day of surgery medications reviewed with patient. ? Reproductive/Obstetrics ?negative OB ROS ? ?  ? ? ? ? ? ? ? ? ? ? ? ? ? ?  ?  ? ? ? ? ? ? ? ?Anesthesia Physical ?Anesthesia Plan ? ?ASA: 4 and emergent ? ?Anesthesia Plan: General  ? ?Post-op Pain Management:   ? ?Induction: Intravenous and Rapid sequence ? ?PONV Risk Score and Plan: 2 and Treatment may vary due to age or medical condition, Midazolam, Dexamethasone and Ondansetron ? ?Airway Management Planned: Oral ETT ? ?Additional Equipment: None ? ?Intra-op Plan:  ? ?Post-operative Plan: Extubation in OR ? ?Informed Consent: I have reviewed the patients History and Physical, chart, labs and discussed the procedure including the risks, benefits and alternatives for the proposed anesthesia with the patient or authorized representative who has indicated his/her understanding and acceptance.   ? ? ? ?Dental advisory given ? ?Plan Discussed with: CRNA ? ?Anesthesia Plan Comments:   ? ? ? ? ?Anesthesia Quick Evaluation ? ?

## 2021-09-16 NOTE — Progress Notes (Signed)
PT Cancellation Note ? ?Patient Details ?Name: Terri [name] ?MRN: [id number] ?DOB: 27-Feb-1976 ? ? ?Cancelled Treatment:    Reason Eval/Treat Not Completed: Patient declined, states that she does not feel well and L foot is painfuul. Will check back another time.  ?[name] PT ?Acute Rehabilitation Services ?Pager [phone] ?Office [phone].sign ? ? ?Apollonia Amini, Shella Maxim ?09/16/2021, 11:00 AM ?

## 2021-09-16 NOTE — Progress Notes (Signed)
Patient refused lab draw. RN explained importance of needing lab work results to know how to best help patient. Patient was adamant in her refusal.  ? ?MD notified. ?

## 2021-09-16 NOTE — Progress Notes (Signed)
?  [facility] for Infectious Disease   ? ?Date of Admission:  08/31/2021   Total days of antibiotics 9 ?        ? ? ?ID: Terri [name] is a 46 y.o. female with  cirrhosis, undernourished found to have hepatic abscess with portal vein thrombosis, GIB, candidal esophagitis and presumed MSSA disseminated infection due to gangrenous left great toe and left hand skin lesion. ? ?Principal Problem: ?  Acute upper GI bleed ?Active Problems: ?  Essential hypertension ?  Hypokalemia ?  Polysubstance abuse ([place]) ?  Acute on chronic blood loss anemia ?  HAP (hospital-acquired pneumonia) ?  Portal vein thrombosis ?  Lactic acidosis ?  Hepatic lesion ?  Candida infection, esophageal ([place]) ?  Hepatic abscess ?  Acute back pain ?  Swelling of left hand ?  Gangrene of toe of left foot ([place]) ?  Staph aureus infection ?  Ascites ?  Exudative pleural effusion ?  Hypophosphatemia ?  Vaginal discharge ?  Acute respiratory failure with hypoxia ([place]) ?  Dysuria ? ? ? ?Subjective: ?Afebrile, does not participate with pt/ot due to pain to left foot. She underwent MRI that showed abscess involving flexor hallucis longus tendon and probable osteo ? ?Medications:  ? amLODipine  10 mg Oral Daily  ? fluconazole  200 mg Oral Daily  ? folic acid  1 mg Oral Daily  ? guaiFENesin  600 mg Oral BID  ? hydrALAZINE  50 mg Oral BID  ? lidocaine  1 patch Transdermal Daily  ? lisinopril  10 mg Oral QHS  ? magnesium oxide  400 mg Oral BID  ? metroNIDAZOLE  500 mg Oral Q12H  ? mirtazapine  7.5 mg Oral QHS  ? nicotine  21 mg Transdermal Daily  ? pantoprazole  40 mg Oral BID  ? sucralfate  1 g Oral TID WC & HS  ? vitamin B-12  1,000 mcg Oral Daily  ? ? ?Objective: ?Vital signs in last 24 hours: ?Temp:  [98.3 ?F (36.8 ?C)-100.4 ?F (38 ?C)] 98.3 ?F (36.8 ?C) (04/26 0601) ?Pulse Rate:  [92] 92 (04/26 0601) ?Resp:  [16-18] 18 (04/26 0601) ?BP: (104-108)/(61-71) 108/61 (04/26 0601) ?SpO2:  [100 %] 100 % (04/26 0601) ?Weight:  [63.7 kg] 63.7 kg (04/26  0500) ? ?Physical Exam  ?Constitutional:  oriented to person, place, and time. appears chronically and mal-nourished. No distress.  ?HENT: [place]/AT, PERRLA, no scleral icterus ?Mouth/Throat: Oropharynx is clear and moist. No oropharyngeal exudate.  ?Cardiovascular: Normal rate, regular rhythm and normal heart sounds. Exam reveals no gallop and no friction rub.  ?No murmur heard.  ?Pulmonary/Chest: Effort normal and breath sounds normal. No respiratory distress.  has no wheezes.  ?Neck = supple, no nuchal rigidity ?Abdominal: Soft. Bowel sounds are normal.  exhibits no distension. There is no tenderness.  ?Lymphadenopathy: no cervical adenopathy. No axillary adenopathy ?Neurological: alert and oriented to person, place, and time.  ?Skin: Skin is warm and dry. No rash noted. No erythema.  ?Psychiatric: a normal mood and affect.  behavior is normal.  ? ? ?Lab Results ?Recent Labs  ?  09/14/21 ?0254 09/15/21 ?1641  ?WBC 7.4 5.0  ?HGB 7.8* 8.0*  ?HCT 23.8* 24.9*  ?NA 133* 133*  ?K 3.8 3.5  ?CL 103 103  ?CO2 25 27  ?BUN 13 11  ?CREATININE 0.30* 0.45  ? ?Liver Panel ?Recent Labs  ?  09/14/21 ?0254 09/15/21 ?1641  ?PROT 4.3* 4.2*  ?ALBUMIN <1.5* <1.5*  ?AST 21 17  ?  ALT <5 7  ?ALKPHOS 169* 153*  ?BILITOT 0.6 0.3  ? ?Sedimentation Rate ?No results for input(s): ESRSEDRATE in the last 72 hours. ?C-Reactive Protein ?No results for input(s): CRP in the last 72 hours. ? ?Microbiology: ?reviewed ?Studies/Results: ?MR FOOT LEFT W WO CONTRAST ? ?Result Date: 09/16/2021 ?CLINICAL DATA:  Foot swelling, nondiabetic, osteomyelitis suspected swelling first toe EXAM: MRI OF THE LEFT FOREFOOT WITHOUT AND WITH CONTRAST TECHNIQUE: Multiplanar, multisequence MR imaging of the left forefoot was performed both before and after administration of intravenous contrast. CONTRAST:  48m GADAVIST GADOBUTROL 1 MMOL/ML IV SOLN COMPARISON:  Foot radiograph 08/27/2021. FINDINGS: There is failure of fat saturation and fat inversion limiting evaluation on T2  weighted and STIR sequences. Bones/Joint/Cartilage There is bony irregularity of the distal phalanx of the great toe, and abnormal marrow signal within the distal phalanx and distal aspect of the proximal phalanx. Ligaments Intact MTP collateral ligaments.  Intact Lisfranc ligament. Muscles and Tendons There is rim enhancing fluid surrounding the flexor hallucis longus tendon at the level of the mid metatarsal (axial T1 postcontrast image 32). There is also abnormal signal of the flexor hallucis longus tendon just proximal to the sesamoids. Soft tissues There is a soft tissue ulcer at the tip of the great toe, with underlying non enhancement of the skin and underlying soft tissues suggesting necrosis. There is an fluid tracking from the ulcer in the proximal direction along the great toe, with rim enhancing fluid extending to the level of the mid metatarsal superficially and along the flexor hallucis longus tendon in the deeper tissues (sagittal postcontrast image 8). Overall this probable abscess measures up to 1.6 cm short axis and 7.3 cm in length (series 11, image 23, series 12, image 8). IMPRESSION: Poor image quality due to failure of fat signal suppression. Probable osteomyelitis of the distal phalanx of the great toe and questionable osteomyelitis involving the distal aspect of the proximal phalanx. Soft tissue necrosis at the tip of the great toe with rim enhancing abscess extending from the great toe ulceration proximally to the level of the mid metatarsal and with deeper tenosynovial involvement of the flexor hallucis longus tendon in the midfoot. Overall this abscess measures up to 1.6 cm short axis and 7.3 cm in length. Electronically Signed   By: JMaurine SimmeringM.D.   On: 09/16/2021 08:30  ? ?DG CHEST PORT 1 VIEW ? ?Result Date: 09/15/2021 ?CLINICAL DATA:  Shortness of breath. EXAM: PORTABLE CHEST 1 VIEW COMPARISON:  September 13, 2021 FINDINGS: The heart size and mediastinal contours are within normal limits.  Grossly stable right upper lobe pneumonia is noted. Mild left basilar atelectasis or infiltrate is noted as well. The visualized skeletal structures are unremarkable. IMPRESSION: Grossly stable right upper lobe pneumonia. Stable mild left basilar atelectasis or infiltrate. Electronically Signed   By: JMarijo ConceptionM.D.   On: 09/15/2021 08:21   ? ? ?Assessment/Plan: ?Polymicrobial hepatic abscess =continue on cefazolin plus metronidazole ? ?Osteomyelitis of great toe, left leg = continue on abtx but also have ortho weigh in to see if minimal invasive surgery to wash out abscess- granted she is poor candidate for surgery given severe protein-calorie malnutrition and cirrhosis ? ?Disseminated MSSA = unclear if able to get TEE, plan to treat for 6 wk ? ?Eso candidiasis = continue on fluconazole ? ?Poor sleep and decrased appetitie = will start on remerone 7.'5mg'$  to see if helps with symptoms ? ?Vaginal discharge = currently on metronidazole for presumed BV ? ?CCaren Griffins  Royale Lennartz ?[facility] for Infectious Diseases ?Pager: [phone] ? ?09/16/2021, 5:15 PM ? ? ? ? ? ?

## 2021-09-16 NOTE — Progress Notes (Signed)
?Progress Note ? ? ?Patient: Terri [name] HYQ:[id number] DOB: 1975/11/10 DOA: 08/31/2021     15 ?DOS: the patient was seen and examined on 09/16/2021 ?  ?Brief hospital course: ?46 year old woman admitted for acute GIB. EGD showed esophageal plaques consistent with candidiasis, large cratered nonbleeding duodenal ulcer with no stigmata of bleeding.  CT angiogram on 08/31/2021 showed multiple low-attenuation lesions throughout the liver seen on recent prior exam, highly concerning for hepatic abscesses or metastasis.  Now s/p IR US guided bx and aspiration, revealing MSSA abscess.  Workup in process for suspected endocarditis, s/p unrevealing TTE. TEE was delayed by hypoxia, planned for Friday. Hospitalization complicated by dry gangrene of L first toe with MRI showing osteo and abscess; transferring to [facility] per ortho request.  ? ?Assessment and Plan: ?* Acute upper GI bleed RESOLVED ?--resolved. EGD 4/13 with esophageal plaques c/w candidiasis, large cratered non bleeding jejunal ulcer with non stigmata of bleeding.  Nodular regions more like regenerative tissue and not like visible vessels. ?PPI BID x2 months, sucralfate QID x2 weeks ? ?Staph aureus liver abscess, suspect disseminated ?--liver abnormalities on imaging, s/o biopsy compatible with abscess, aspiration 4/17 with abundant staph aureus (MSSA), abundant bacteroides oralis ?--left foot osteo and abscess -- orthopedics consulted ?--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 given left great toe gangrene and wrist swelling. MRI lumbar and thoracic spine without discitis/osteomyelitis in thoracolumbar spine ?--repeat CT with contrast or MRI of liver with and without contrast in 4 weeks to confirm that liver lesions were abscess and not metastasis ?--Further recommendations per ID  ? ?Left foot osteomyelitis and abscess, as above ? ?Gangrene of toe of left foot ([place]) ?--Seen by Dr. Scot Dock on 4/15, noted dry gangrene on tip of L great toe, palpable pedal pulses, thought to have  adequate circulation to heal this (unclear etiology per vascular).  Recommended outpatient follow up, call back as needed. ? ?HAP (hospital-acquired pneumonia) ?--RUL pneumonia on CXR 4/25 ?--now down to minimal oxygen requirement ?--finish empiric abx   ? ?Acute on chronic blood loss anemia ?--secondary to GIB ? ?Swelling of left hand ?--MRI limited, no fluid collection or tenosynovitis seen ? ?Exudative pleural effusion ?--Thoracentesis 4/23 with 1.2 L of turbid light yellow fluid ?Post thora CXR with severe multilobar bilateral pneumonia, most pronounced in R mid lung, needs f/u PA/lateral in 3-4 weeks following abx therapy to ensure resolution ? ?Portal vein thrombosis ?--With recent GI bleeding, hold on anticoagulation ?--Large burden of thrombus in central portal vein as well as R portal vein and branches\ ?--Will need to discuss with GI, consider restarting  ? ?Ascites ?--2 L removed during liver bx, but analysis not performed at that point ?Repeat US didn't show enough fluid for repeat para/analysis ? ?Polysubstance abuse ([place]) ?Encourage cessation ?UDS with cocaine ? ?Candida infection, esophageal ([place]) ?"Esophageal plaques were found, consistent with candidiasis" ?-- continue fluconazole ?-- HIV nonreactive ?-- negative acute hepatitis panel  ? ?Vaginal discharge = currently on metronidazole for presumed BV ? ?  ? ?Subjective:  ?Complains of left foot pain, can't put any weight on it, can't walk on it ?C/o cough, chest pain from cough ?Worried about left foot ? ?Physical Exam: ?Vitals:  ? 09/15/21 1403 09/15/21 1952 09/16/21 0500 09/16/21 0601  ?BP: 93/66 104/71  108/61  ?Pulse: (!) 104 92  92  ?Resp: '20 16  18  '$ ?Temp: 98.7 ?F (37.1 ?C) (!) 100.4 ?F (38 ?C)  98.3 ?F (36.8 ?C)  ?TempSrc: Oral Oral  Oral  ?SpO2: 96% 100%  100%  ?  Weight:   63.7 kg   ?Height:      ? ?Physical Exam ?Vitals reviewed.  ?Constitutional:   ?   General: She is not in acute distress. ?   Appearance: She is ill-appearing. She is not  toxic-appearing.  ?Cardiovascular:  ?   Rate and Rhythm: Normal rate and regular rhythm.  ?   Heart sounds: No murmur heard. ?Pulmonary:  ?   Effort: Pulmonary effort is normal. No respiratory distress.  ?   Breath sounds: No wheezing, rhonchi or rales.  ?Skin: ?   Comments: Left great toe tip black, great toe is red  ?Neurological:  ?   Mental Status: She is alert.  ?Psychiatric:     ?   Mood and Affect: Mood normal.     ?   Behavior: Behavior normal.  ? ? ?Data Reviewed: ? ?MRI showed Probable osteomyelitis of the distal phalanx of the great toe and ?questionable osteomyelitis involving the distal aspect of the ?proximal phalanx. Soft tissue necrosis at the tip of the great toe ?with rim enhancing abscess extending from the great toe ulceration ?proximally to the level of the mid metatarsal and with deeper ?tenosynovial involvement of the flexor hallucis longus tendon in the ?midfoot. Overall this abscess measures up to 1.6 cm short axis and ?7.3 cm in length. ? ?Family Communication: none ? ?Disposition: ?Status is: Inpatient ?Remains inpatient appropriate because: liver and foot abscess ? Planned Discharge Destination: Home ? ? ? ?Time spent: 25 minutes ? ?Author: ?Murray Hodgkins, MD ?09/16/2021 5:35 PM ? ?For on call review [email or web address].  ?

## 2021-09-16 NOTE — Op Note (Signed)
08/31/2021 - 09/03/2021 ? ?1:28 AM ? ? ?PATIENT: Terri [name]  46 y.o. female ? ?MRN: [id number] ? ? 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 ? ?POST-OPERATIVE DIAGNOSIS:   ?Same ? ? ?PROCEDURE: ?1) Left great toe disarticulation at the first metatarsophalangeal joint ?2) Left forefoot abscess incision and drainage ? ? ?SURGEON:  Armond Hang, MD ? ? ?ASSISTANT: None ? ? ?ANESTHESIA: General, regional ? ? ?EBL: Minimal ? ? ?TOURNIQUET:   ? ?Total Tourniquet Time Documented: ?Thigh (Left) - -30 minutes ?Total: Thigh (Left) - -30 minutes ? ? ? ?COMPLICATIONS: None apparent ? ? ?DISPOSITION: Extubated, awake and stable to recovery. ? ? ?INDICATION FOR PROCEDURE: ?The patient presented with left great toe osteomyelitis and abscess along FHL in region of forefoot. Her PMH is notable for polysubstance abuse, cirrhosis, undernourished found to have hepatic abscess with portal vein thrombosis, GIB, candidal esophagitis and presumed MSSA disseminated infection due to gangrenous left great toe and left hand skin lesion. She is currently admitted to hospitalist team for ongoing MSSA disseminated infection. ? ?We discussed the diagnosis, alternative treatment options, risks and benefits of the above surgical intervention, as well as alternative non-operative treatments. All questions/concerns were addressed and the patient/family demonstrated appropriate understanding of the diagnosis, the procedure, the postoperative course, and overall prognosis. The patient wished to proceed with surgical intervention and signed an informed surgical consent as such, in each others presence prior to surgery. ? ? ?PROCEDURE IN DETAIL: ?After preoperative consent was obtained and the correct operative site was identified, the patient was brought to the operating room supine on stretcher and transferred onto operating table. General anesthesia was induced. Preoperative  antibiotics were administered. Surgical timeout was taken. The patient was then positioned supine with an ipsilateral hip bump. The operative lower extremity was prepped and draped in standard sterile fashion with a tourniquet around the thigh. The extremity was exsanguinated and the tourniquet was inflated to 275 mmHg. ? ?Level of amputation was determined by soft tissue status & preoperative MRI to be the first MTP joint as confirmed by intraoperative examination. A standard racket incision was made in healthy skin area at the level of the 1st metatarsophalangeal joint. Dissection was carried down sharply to the proximal phalanx. Copious amounts of frank purulence was encountered during dissection, including in the 1st MTP joint itself.  ? ?The surrounding tendons and other soft tissues were carefully transected and the collateral ligaments of the 1st MTP joint released. The frankly purulent, ulcerated left great toe was thus excised and sent as specimen for bone biopsy/intraoperative cultures. ? ?We identified the left forefoot abscess plantar to the 1st metatarsal and found this to be tracking along the flexor hallucis longus tendon. This was evacuated and thoroughly debrided. ? ?We then irrigated the surgical site thoroughly using cysto tubing. Healthy bleeding noted throughout the amputation stump site. There were no gross signs of infection remaining in the surgical site following amputation. Hemostasis was carefully obtained. Povidone iodine solution was instilled and allowed to sit for some time prior to repeat irrigation. Vancomycin powder was placed in the amputation site.  ?  ?The subcutaneous tissues closed using 2-0 and 3-0 PDS. The skin was closed with zero tension using 3-0 nylon suture. The incision was noted to be dry upon closure. Overall, the operative foot was noted to be significantly less erythematous with decreased swelling. ?  ?The leg was cleaned with saline and sterile adaptic dressings  with bacitracin ointment were applied. A well padded sterile dressing was applied. The patient was awakened from anesthesia and transported to the recovery room in stable condition.  ? ? ?FOLLOW UP PLAN: ?-transfer to PACU, then return to [facility] ?-IV abx per primary team; trend intraoperative cultures ?-maximum elevation of operative extremity to minimize swelling ?-OK to ambulate with heel weightbearing in CAM boot or postop shoe ?-maintain sterile dressings x 24 hr postop, then nursing to do daily dry dressing changes thereafter ?-pain meds per primary team ?-VTE prophylaxis per primary team discretion ?-discharge home per primary team ?-sutures out in 3-4 weeks in outpatient office ?  ?  ?RADIOGRAPHS: ?AP, lateral, oblique radiographs of the left foot were obtained intraoperatively. These showed interval disarticulation of the left great toe at the first metatarsophalangeal joint. No acute injuries are noted. ? ? ?Armond Hang ?Orthopaedic Surgery ?[facility] ? ? ?

## 2021-09-16 NOTE — Progress Notes (Signed)
Notified by nursing 1830-nursing called patient's sister, who reported patient was discharged and threatening to strangle herself because of the stress of surgery.  I was asked to call the sister.  I called the patient to assess her desires and she explicitly told not to call her sister and refused to talk further with me about how she currently feels. ? ?Placed on suicide precautions.  Orthopedics team updated. ? ?Murray Hodgkins, MD ?[facility] ? ?

## 2021-09-16 NOTE — Progress Notes (Signed)
OT Cancellation Note ? ?Patient Details ?Name: Terri [name] ?MRN: [id number] ?DOB: 12/27/1975 ? ? ?Cancelled Treatment:    Reason Eval/Treat Not Completed: Pain limiting ability to participate. Patient reports "I don't feel good" and also reports pain in back and is adamant about not doing anything today and that she cannot walk. Will attempt again tomorrow. ? ?Oda Lansdowne L Jolleen Seman ?09/16/2021, 11:01 AM ?

## 2021-09-17 ENCOUNTER — Inpatient Hospital Stay (HOSPITAL_COMMUNITY): Payer: Medicaid Other | Admitting: Registered Nurse

## 2021-09-17 ENCOUNTER — Encounter (HOSPITAL_COMMUNITY): Payer: Self-pay | Admitting: Orthopaedic Surgery

## 2021-09-17 DIAGNOSIS — F17210 Nicotine dependence, cigarettes, uncomplicated: Secondary | ICD-10-CM

## 2021-09-17 DIAGNOSIS — J9601 Acute respiratory failure with hypoxia: Secondary | ICD-10-CM

## 2021-09-17 DIAGNOSIS — J189 Pneumonia, unspecified organism: Secondary | ICD-10-CM | POA: Diagnosis not present

## 2021-09-17 DIAGNOSIS — R45851 Suicidal ideations: Secondary | ICD-10-CM

## 2021-09-17 DIAGNOSIS — Y95 Nosocomial condition: Secondary | ICD-10-CM

## 2021-09-17 DIAGNOSIS — D649 Anemia, unspecified: Secondary | ICD-10-CM

## 2021-09-17 DIAGNOSIS — A4901 Methicillin susceptible Staphylococcus aureus infection, unspecified site: Secondary | ICD-10-CM | POA: Diagnosis not present

## 2021-09-17 DIAGNOSIS — F418 Other specified anxiety disorders: Secondary | ICD-10-CM

## 2021-09-17 DIAGNOSIS — I96 Gangrene, not elsewhere classified: Secondary | ICD-10-CM | POA: Diagnosis not present

## 2021-09-17 DIAGNOSIS — I10 Essential (primary) hypertension: Secondary | ICD-10-CM

## 2021-09-17 DIAGNOSIS — M869 Osteomyelitis, unspecified: Secondary | ICD-10-CM

## 2021-09-17 LAB — URINE CULTURE: Culture: NO GROWTH

## 2021-09-17 LAB — LEGIONELLA PNEUMOPHILA SEROGP 1 UR AG: L. pneumophila Serogp 1 Ur Ag: NEGATIVE

## 2021-09-17 LAB — PATHOLOGIST SMEAR REVIEW

## 2021-09-17 MED ORDER — OXYCODONE HCL 5 MG PO TABS
5.0000 mg | ORAL_TABLET | Freq: Four times a day (QID) | ORAL | Status: DC | PRN
  Administered 2021-09-17 – 2021-09-19 (×6): 10 mg via ORAL
  Filled 2021-09-17 (×8): qty 2

## 2021-09-17 MED ORDER — ENSURE ENLIVE PO LIQD
237.0000 mL | Freq: Two times a day (BID) | ORAL | Status: DC
  Administered 2021-09-17 – 2021-09-19 (×2): 237 mL via ORAL

## 2021-09-17 MED ORDER — ALBUTEROL SULFATE (2.5 MG/3ML) 0.083% IN NEBU
INHALATION_SOLUTION | RESPIRATORY_TRACT | Status: AC
  Filled 2021-09-17: qty 3

## 2021-09-17 MED ORDER — PROPOFOL 10 MG/ML IV BOLUS
INTRAVENOUS | Status: DC | PRN
  Administered 2021-09-17: 80 mg via INTRAVENOUS

## 2021-09-17 MED ORDER — PHENYLEPHRINE HCL-NACL 20-0.9 MG/250ML-% IV SOLN
INTRAVENOUS | Status: DC | PRN
  Administered 2021-09-17: 25 ug/min via INTRAVENOUS

## 2021-09-17 MED ORDER — ADULT MULTIVITAMIN W/MINERALS CH
1.0000 | ORAL_TABLET | Freq: Every day | ORAL | Status: DC
  Filled 2021-09-17 (×2): qty 1

## 2021-09-17 MED ORDER — PHENYLEPHRINE 80 MCG/ML (10ML) SYRINGE FOR IV PUSH (FOR BLOOD PRESSURE SUPPORT)
PREFILLED_SYRINGE | INTRAVENOUS | Status: AC
  Filled 2021-09-17: qty 10

## 2021-09-17 MED ORDER — OXYCODONE HCL 5 MG/5ML PO SOLN: 5.0000 mg | Freq: Once | ORAL | Status: DC | PRN

## 2021-09-17 MED ORDER — ACETAMINOPHEN 10 MG/ML IV SOLN
INTRAVENOUS | Status: DC | PRN
  Administered 2021-09-17: 1000 mg via INTRAVENOUS

## 2021-09-17 MED ORDER — BACITRACIN ZINC 500 UNIT/GM EX OINT
TOPICAL_OINTMENT | CUTANEOUS | Status: DC | PRN
  Administered 2021-09-17: 1 via TOPICAL

## 2021-09-17 MED ORDER — ONDANSETRON HCL 4 MG/2ML IJ SOLN
INTRAMUSCULAR | Status: DC | PRN
Start: 2021-09-17 — End: 2021-09-17
  Administered 2021-09-17: 4 mg via INTRAVENOUS

## 2021-09-17 MED ORDER — CEFAZOLIN SODIUM-DEXTROSE 2-4 GM/100ML-% IV SOLN
INTRAVENOUS | Status: AC
  Filled 2021-09-17: qty 100

## 2021-09-17 MED ORDER — FENTANYL CITRATE PF 50 MCG/ML IJ SOSY: 25.0000 ug | PREFILLED_SYRINGE | INTRAMUSCULAR | Status: DC | PRN

## 2021-09-17 MED ORDER — FENTANYL CITRATE (PF) 100 MCG/2ML IJ SOLN
INTRAMUSCULAR | Status: DC | PRN
  Administered 2021-09-17: 50 ug via INTRAVENOUS
  Administered 2021-09-17: 100 ug via INTRAVENOUS

## 2021-09-17 MED ORDER — SODIUM CHLORIDE 0.9 % IR SOLN
Status: DC | PRN
  Administered 2021-09-17: 1000 mL

## 2021-09-17 MED ORDER — BACITRACIN ZINC 500 UNIT/GM EX OINT
TOPICAL_OINTMENT | CUTANEOUS | Status: AC
  Filled 2021-09-17: qty 28.35

## 2021-09-17 MED ORDER — BUPIVACAINE HCL 0.25 % IJ SOLN
INTRAMUSCULAR | Status: AC
  Filled 2021-09-17: qty 1

## 2021-09-17 MED ORDER — LIDOCAINE 2% (20 MG/ML) 5 ML SYRINGE
INTRAMUSCULAR | Status: DC | PRN
  Administered 2021-09-17: 60 mg via INTRAVENOUS

## 2021-09-17 MED ORDER — BUPIVACAINE HCL (PF) 0.25 % IJ SOLN
INTRAMUSCULAR | Status: DC | PRN
  Administered 2021-09-17: 20 mL

## 2021-09-17 MED ORDER — ACETAMINOPHEN 325 MG PO TABS
650.0000 mg | ORAL_TABLET | Freq: Once | ORAL | Status: AC
Start: 2021-09-17 — End: 2021-09-17
  Administered 2021-09-17: 650 mg via ORAL
  Filled 2021-09-17: qty 2

## 2021-09-17 MED ORDER — MIDAZOLAM HCL 5 MG/5ML IJ SOLN
INTRAMUSCULAR | Status: DC | PRN
  Administered 2021-09-16: 2 mg via INTRAVENOUS

## 2021-09-17 MED ORDER — ALBUTEROL SULFATE (2.5 MG/3ML) 0.083% IN NEBU
2.5000 mg | INHALATION_SOLUTION | Freq: Once | RESPIRATORY_TRACT | Status: AC
  Administered 2021-09-17: 2.5 mg via RESPIRATORY_TRACT

## 2021-09-17 MED ORDER — CHLORHEXIDINE GLUCONATE CLOTH 2 % EX PADS
6.0000 | MEDICATED_PAD | Freq: Every day | CUTANEOUS | Status: AC
  Administered 2021-09-17 – 2021-09-21 (×4): 6 via TOPICAL

## 2021-09-17 MED ORDER — HYDROMORPHONE HCL 1 MG/ML IJ SOLN
0.5000 mg | Freq: Once | INTRAMUSCULAR | Status: AC
  Administered 2021-09-17: 0.5 mg via INTRAVENOUS
  Filled 2021-09-17: qty 0.5

## 2021-09-17 MED ORDER — LACTATED RINGERS IV SOLN: INTRAVENOUS | Status: DC | PRN

## 2021-09-17 MED ORDER — OXYCODONE HCL 5 MG PO TABS: 5.0000 mg | ORAL_TABLET | Freq: Once | ORAL | Status: DC | PRN

## 2021-09-17 MED ORDER — SODIUM CHLORIDE 0.9 % IR SOLN
Status: DC | PRN
  Administered 2021-09-17: 3000 mL

## 2021-09-17 MED ORDER — JUVEN PO PACK
1.0000 | PACK | Freq: Two times a day (BID) | ORAL | Status: DC
  Administered 2021-09-17: 1 via ORAL
  Filled 2021-09-17 (×3): qty 1

## 2021-09-17 MED ORDER — SUCCINYLCHOLINE CHLORIDE 200 MG/10ML IV SOSY
PREFILLED_SYRINGE | INTRAVENOUS | Status: DC | PRN
  Administered 2021-09-17: 80 mg via INTRAVENOUS

## 2021-09-17 MED ORDER — VANCOMYCIN HCL 1000 MG IV SOLR
INTRAVENOUS | Status: DC | PRN
  Administered 2021-09-17: 500 mg via TOPICAL

## 2021-09-17 MED ORDER — PHENYLEPHRINE HCL (PRESSORS) 10 MG/ML IV SOLN
INTRAVENOUS | Status: DC | PRN
  Administered 2021-09-17 (×2): 160 ug via INTRAVENOUS

## 2021-09-17 MED ORDER — MUPIROCIN 2 % EX OINT
1.0000 "application " | TOPICAL_OINTMENT | Freq: Two times a day (BID) | CUTANEOUS | Status: AC
  Administered 2021-09-17 – 2021-09-21 (×8): 1 via NASAL
  Filled 2021-09-17 (×3): qty 22

## 2021-09-17 NOTE — Anesthesia Postprocedure Evaluation (Signed)
Anesthesia Post Note ? ?Patient: Terri [name] ? ?Procedure(s) Performed: AMPUTATION OF LEFT GREAT TOE (Left: Toe) ? ?  ? ?Patient location during evaluation: PACU ?Anesthesia Type: General ?Level of consciousness: awake and alert ?Pain management: pain level controlled ?Vital Signs Assessment: post-procedure vital signs reviewed and stable ?Respiratory status: spontaneous breathing, nonlabored ventilation and respiratory function stable ?Cardiovascular status: blood pressure returned to baseline ?Postop Assessment: no apparent nausea or vomiting ?Anesthetic complications: no ? ? ?No notable events documented. ?  ? ?Marthenia Rolling ? ? ? ? ?

## 2021-09-17 NOTE — Progress Notes (Addendum)
?Progress Note ? ? ?Patient: Terri [name] GYK:[id number] DOB: 11/01/1975 DOA: 08/31/2021     16 ?DOS: the patient was seen and examined on 09/17/2021 ?  ?Brief hospital course: ?46 year old woman admitted for acute GIB. EGD showed esophageal plaques consistent with candidiasis, large cratered nonbleeding duodenal ulcer with no stigmata of bleeding.  CT angiogram showed multiple low-attenuation lesions throughout the liver seen on recent prior exam, highly concerning for hepatic abscesses or metastasis.  S/p IR US guided bx and aspiration, revealing MSSA abscess.  Workup in process for suspected endocarditis, s/p unrevealing TTE. TEE was delayed by hypoxia, planned for Friday. 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 Seen by psychiatry and cleared. ? ?Assessment and Plan: ? ?Disseminated MSSA with liver abscess, left foot osteo and abscess, 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 ?--liver abnormalities on imaging, s/o biopsy compatible with abscess, aspiration 4/17 with abundant staph aureus (MSSA), abundant bacteroides oralis ?--left foot osteo and abscess -- orthopedics consulted ?--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 given left great toe gangrene and wrist swelling. MRI lumbar and thoracic spine without discitis/osteomyelitis in thoracolumbar spine ?--repeat CT with contrast or MRI of liver with and without contrast in 4 weeks to confirm that liver lesions were abscess and not metastasis ?--Further recommendations per ID ? ?Acute upper GI bleed, resolved ?--resolved. EGD 4/13 with esophageal plaques c/w candidiasis, large cratered non bleeding jejunal ulcer with non stigmata of bleeding.  Nodular regions more like regenerative tissue and not like visible vessels. ?PPI BID x2 months, sucralfate QID x2 weeks ? ?Acute on chronic blood loss anemia ?--secondary to GIB; stable ? ?Gangrene of toe of left foot ([place]) ?--Seen by Dr. Scot Dock on 4/15, noted dry gangrene on  tip of L great toe, palpable pedal pulses, thought to have adequate circulation to heal this (unclear etiology per vascular).  Recommended outpatient follow up, call back as needed. ? ?HAP with associated acute hypoxic respiratory failure ?--RUL pneumonia on CXR 4/25 ?--hypoxia resolved ?--finish empiric abx   ? ?Portal vein thrombosis ?--With recent GI bleeding, hold on anticoagulation ?--Large burden of thrombus in central portal vein as well as R portal vein and branches\ ?--Will need to discuss with GI, consider restarting anticoagulation ? ?Candida infection, esophageal ([place]) ?-- continue fluconazole ?-- check HIV nonreactive ?-- negative acute hepatitis panel  ? ?Swelling of left hand ?--MRI limited, no fluid collection or tenosynovitis seen ? ?Essential hypertension ?--stable, continue antihypertensives ? ?Polysubstance abuse ([place]) ?--Encourage cessation. UDS with cocaine ? ?Ascites ?--2 L removed during liver bx, but analysis not performed. Repeat US didn't show enough fluid for repeat paracentesis ? ?Exudative pleural effusion ?--Thoracentesis 4/23 with 1.2 L of turbid light yellow fluid ?Post thora CXR with severe multilobar bilateral pneumonia, most pronounced in R mid lung, needs f/u PA/lateral in 3-4 weeks following abx therapy to ensure resolution ? ?Vaginal discharge ?--currently on metronidazole for presumed BV ? ?Suicidal ideation, resolved ?--resolved, but wouldn't cooperate with psychiatry, will utilize tele-sitter.  ? ? ?  ? ?Subjective:  ?Pt consented to surgery ?S/p 1) Left great toe disarticulation at the first metatarsophalangeal joint, 2) Left forefoot abscess incision and drainage ?Per cardiology plan for TEE Friday ?Complains of pain in left foot ?Denies SI ? ?Physical Exam: ?Vitals:  ? 09/17/21 0537 09/17/21 0630 09/17/21 0800 09/17/21 0829  ?BP:  101/70 107/74   ?Pulse:   92   ?Resp:   17   ?Temp: 98.8 ?F (37.1 ?C)  97.8 ?F (  36.6 ?C)   ?TempSrc: Oral Oral Oral   ?SpO2:   91% 94%   ?Weight:      ?Height:      ? ?Physical Exam ?Vitals and nursing note reviewed.  ?Constitutional:   ?   General: She is not in acute distress. ?   Appearance: She is ill-appearing. She is not toxic-appearing.  ?Cardiovascular:  ?   Rate and Rhythm: Normal rate and regular rhythm.  ?   Heart sounds: No murmur heard. ?Pulmonary:  ?   Effort: Pulmonary effort is normal. No respiratory distress.  ?   Breath sounds: No wheezing, rhonchi or rales.  ?Neurological:  ?   Mental Status: She is alert.  ?Psychiatric:     ?   Mood and Affect: Mood normal.     ?   Behavior: Behavior normal.  ?No events charted overnight ? ?Data Reviewed: ? ?Wound cultures pending ? ?Family Communication: none ? ?Disposition: ?Status is: Inpatient ?Remains inpatient appropriate because: disseminated MSSA requiring IV abx; plan for TEE ? Planned Discharge Destination: Home ? ? ? ?Time spent: 20 minutes ? ?Author: ?Murray Hodgkins, MD ?09/17/2021 4:23 PM ? ?For on call review [email or web address].  ?

## 2021-09-17 NOTE — Progress Notes (Signed)
?   09/17/21 1855  ?Assess: MEWS Score  ?Temp (!) 102.8 ?F (39.3 ?C)  ?BP 110/74  ?Pulse Rate (!) 121  ?Resp 20  ?Level of Consciousness Alert  ?SpO2 90 %  ?O2 Device HFNC  ?O2 Flow Rate (L/min) 6 L/min  ?Assess: MEWS Score  ?MEWS Temp 2  ?MEWS Systolic 0  ?MEWS Pulse 2  ?MEWS RR 0  ?MEWS LOC 0  ?MEWS Score 4  ?MEWS Score Color Red  ?Assess: if the MEWS score is Yellow or Red  ?Were vital signs taken at a resting state? Yes  ?Focused Assessment No change from prior assessment  ?Does the patient meet 2 or more of the SIRS criteria? No  ?MEWS guidelines implemented *See Row Information* Yes  ?Escalate  ?MEWS: Escalate Red: discuss with charge nurse/RN and provider, consider discussing with RRT  ?Notify: Charge Nurse/RN  ?Name of Charge Nurse/RN Notified gorrell  ?Date Charge Nurse/RN Notified 09/17/21  ?Time Charge Nurse/RN Notified 1909  ?Notify: Provider  ?Provider Name/Title D. Sarajane Jews  ?Date Provider Notified 09/17/21  ?Time Provider Notified 1900  ?Notification Type Page  ?Notification Reason Change in status  ?Provider response See new orders  ?Date of Provider Response 09/17/21  ?Time of Provider Response 1900  ?Assess: SIRS CRITERIA  ?SIRS Temperature  1  ?SIRS Pulse 1  ?SIRS Respirations  0  ?SIRS WBC 0  ?SIRS Score Sum  2  ? ? ?

## 2021-09-17 NOTE — Progress Notes (Signed)
?   ? ? ? ? ?[facility] ? ?Date of Admission:  08/31/2021    ? ? ?Total days of antibiotics 16  ? Cefazolin 4/19 >> current ? Metronidazole 4/24 >> current  ? Fluconazole 4/19 >> current ? ? ?ASSESSMENT: ?Terri [name] is a 46 y.o. female admitted with disseminated MSSA infection involving multiple liver abscesses, foot infection (now s/p amputation) and concern over endocarditis.  ?She is due for a TEE tomorrow to investigate for possible IT - She says she will refuse this if her pain mediation is not adjusted. She would not entertain much of my additional conversation attempts.  ? ?Esophageal candidiasis - continue fluconazole.  ? ? ?PLAN: ?Follow TEE if she agrees ?Continue cefazolin + metronidazole for liver abscesses  ? ? ?Principal Problem: ?  Acute upper GI bleed ?Active Problems: ?  Essential hypertension ?  Hypokalemia ?  Polysubstance abuse ([place]) ?  Acute on chronic blood loss anemia ?  HAP (hospital-acquired pneumonia) ?  Portal vein thrombosis ?  Lactic acidosis ?  Hepatic lesion ?  Candida infection, esophageal ([place]) ?  Hepatic abscess ?  Acute back pain ?  Swelling of left hand ?  Gangrene of toe of left foot ([place]) ?  Staph aureus infection ?  Ascites ?  Exudative pleural effusion ?  Hypophosphatemia ?  Vaginal discharge ?  Acute respiratory failure with hypoxia ([place]) ?  Dysuria ? ? ? amLODipine  10 mg Oral Daily  ? Chlorhexidine Gluconate Cloth  6 each Topical Q0600  ? feeding supplement  237 mL Oral BID BM  ? fluconazole  200 mg Oral Daily  ? folic acid  1 mg Oral Daily  ? guaiFENesin  600 mg Oral BID  ? hydrALAZINE  50 mg Oral BID  ? lidocaine  1 patch Transdermal Daily  ? lisinopril  10 mg Oral QHS  ? magnesium oxide  400 mg Oral BID  ? metroNIDAZOLE  500 mg Oral Q12H  ? mirtazapine  7.5 mg Oral QHS  ? multivitamin with minerals  1 tablet Oral Daily  ? mupirocin ointment  1 application. Nasal BID  ? nicotine  21 mg Transdermal Daily  ? nutrition supplement (JUVEN)  1  packet Oral BID WC  ? pantoprazole  40 mg Oral BID  ? sucralfate  1 g Oral TID WC & HS  ? vitamin B-12  1,000 mcg Oral Daily  ? ? ?SUBJECTIVE: ?She is angry about having her dilaudid stopped and told me she refuses her TEE tomorrow. Refused to answer many other questions.  ? ? ?Review of Systems: ?Review of Systems  ?Unable to perform ROS: Severity of pain  ? ? ?Allergies  ?Allergen Reactions  ? Nsaids Other (See Comments)  ?  Stomach ulcer ?  ? Ketorolac Tromethamine Itching and Nausea And Vomiting  ? ? ?OBJECTIVE: ?Vitals:  ? 09/17/21 0537 09/17/21 0630 09/17/21 0800 09/17/21 0829  ?BP:  101/70 107/74   ?Pulse:   92   ?Resp:   17   ?Temp: 98.8 ?F (37.1 ?C)  97.8 ?F (36.6 ?C)   ?TempSrc: Oral Oral Oral   ?SpO2:   91% 94%  ?Weight:      ?Height:      ? ?Body mass index is 20.8 kg/m?. ? ? ?Physical Exam ?Vitals reviewed.  ?Constitutional:   ?   Appearance: She is ill-appearing.  ?Cardiovascular:  ?   Rate and Rhythm: Normal rate.  ?Pulmonary:  ?   Effort: Pulmonary  effort is normal.  ?Musculoskeletal:  ?   Comments: Foot wrapped in clean and dry dressing  ?Skin: ?   General: Skin is warm and dry.  ?Neurological:  ?   Mental Status: She is alert and oriented to person, place, and time.  ? ? ? ?Lab Results ?Lab Results  ?Component Value Date  ? WBC 5.0 09/15/2021  ? HGB 8.0 (L) 09/15/2021  ? HCT 24.9 (L) 09/15/2021  ? MCV 77.3 (L) 09/15/2021  ? PLT 124 (L) 09/15/2021  ?  ?Lab Results  ?Component Value Date  ? CREATININE 0.45 09/15/2021  ? BUN 11 09/15/2021  ? NA 133 (L) 09/15/2021  ? K 3.5 09/15/2021  ? CL 103 09/15/2021  ? CO2 27 09/15/2021  ?  ?Lab Results  ?Component Value Date  ? ALT 7 09/15/2021  ? AST 17 09/15/2021  ? ALKPHOS 153 (H) 09/15/2021  ? BILITOT 0.3 09/15/2021  ?  ? ?Microbiology: ?Recent Results (from the past 240 hour(s))  ?Aerobic/Anaerobic Culture w Gram Stain (surgical/deep wound)     Status: None  ? Collection Time: 09/07/21  3:15 PM  ? Specimen: Abscess  ?Result Value Ref Range Status  ? Specimen  Description   Final  ?  ABSCESS ?Performed at [facility], [place] [address] [place], [place] [address] ?  ? Special Requests   Final  ?  Normal ?Performed at [facility], [place] [address], [place], [place] [address] ?  ? Gram Stain   Final  ?  ABUNDANT WBC PRESENT, PREDOMINANTLY PMN ?ABUNDANT GRAM POSITIVE COCCI ?  ? Culture   Final  ?  ABUNDANT STAPHYLOCOCCUS AUREUS ?ABUNDANT BACTEROIDES ORALIS ?BETA LACTAMASE POSITIVE ?Performed at [facility], [place] [address] [place], [place] [address] ?  ? Report Status 09/12/2021 FINAL  Final  ? Organism ID, Bacteria STAPHYLOCOCCUS AUREUS  Final  ?    Susceptibility  ? Staphylococcus aureus - MIC*  ?  CIPROFLOXACIN <=0.5 SENSITIVE Sensitive   ?  ERYTHROMYCIN >=8 RESISTANT Resistant   ?  GENTAMICIN <=0.5 SENSITIVE Sensitive   ?  OXACILLIN <=0.25 SENSITIVE Sensitive   ?  TETRACYCLINE <=1 SENSITIVE Sensitive   ?  VANCOMYCIN <=0.5 SENSITIVE Sensitive   ?  TRIMETH/SULFA <=10 SENSITIVE Sensitive   ?  CLINDAMYCIN <=0.25 SENSITIVE Sensitive   ?  RIFAMPIN <=0.5 SENSITIVE Sensitive   ?  Inducible Clindamycin NEGATIVE Sensitive   ?  * ABUNDANT STAPHYLOCOCCUS AUREUS  ?Culture, blood (Routine X 2) w Reflex to ID Panel     Status: None  ? Collection Time: 09/08/21 11:08 AM  ? Specimen: BLOOD  ?Result Value Ref Range Status  ? Specimen Description   Final  ?  BLOOD BLOOD LEFT FOREARM ?Performed at [facility], [place] [address] [place], [place] [address] ?  ? Special Requests IN PEDIATRIC BOTTLE Blood Culture adequate volume  Final  ? Culture   Final  ?  NO GROWTH 5 DAYS ?Performed at [facility], [place] [address], [place], [place] [address] ?  ? Report Status 09/13/2021 FINAL  Final  ?Culture, blood (Routine X 2) w Reflex to ID Panel     Status: None  ? Collection Time: 09/08/21 11:17 AM  ? Specimen: BLOOD  ?Result Value Ref Range Status  ? Specimen Description   Final  ?  BLOOD LEFT HAND ?Performed at [facility], [place] [address] [place], [place] [address] ?  ? Special Requests IN PEDIATRIC BOTTLE Blood  Culture adequate volume  Final  ? Culture   Final  ?  NO GROWTH 5 DAYS ?Performed at [facility], [place] [address], [place], [place] [address] ?  ? Report Status 09/13/2021 FINAL  Final  ?Body fluid culture w Gram Stain     Status: None  ? Collection Time: 09/13/21 10:51 AM  ? Specimen: PATH Cytology Pleural fluid  ?Result Value Ref Range Status  ? Specimen Description   Final  ?  PERITONEAL ?Performed at [facility], [place] [address] [place], [place] [address] ?  ? Special Requests   Final  ?  NONE ?Performed at [facility], [place] [address] [place], [place] [address] ?  ? Gram Stain   Final  ?  NO SQUAMOUS EPITHELIAL CELLS SEEN ?FEW WBC SEEN ?NO ORGANISMS SEEN ?  ? Culture   Final  ?  NO GROWTH 3 DAYS ?Performed at [facility], [place] [address], [place], [place] [address] ?  ? Report Status 09/16/2021 FINAL  Final  ?MRSA Next Gen by PCR, Nasal     Status: Abnormal  ? Collection Time: 09/15/21  9:22 AM  ? Specimen: Nasal Mucosa; Nasal Swab  ?Result Value Ref Range Status  ? MRSA by PCR Next Gen DETECTED (A) NOT DETECTED Final  ?  Comment: (NOTE) ?The GeneXpert MRSA Assay (FDA approved for NASAL specimens only), ?is one component of a comprehensive MRSA colonization surveillance ?program. It is not intended to diagnose MRSA infection nor to guide ?or monitor treatment for MRSA infections. ?Test performance is not FDA approved in patients less than 2 years ?old. ?Performed at [facility], [place] [address] ?[place], [place] [address] ?  ?Urine Culture     Status: None  ? Collection Time: 09/15/21  9:45 PM  ? Specimen: Urine, Clean Catch  ?Result Value Ref Range Status  ? Specimen Description   Final  ?  URINE, CLEAN CATCH ?Performed at [facility], [place] [address] [place], [place] [address] ?  ? Special Requests   Final  ?   NONE ?Performed at [facility], [place] [address] [place], [place] [address] ?  ? Culture   Final  ?  NO GROWTH ?Performed at [facility], [place] [address], [place],  [address] ?  ?

## 2021-09-17 NOTE — Progress Notes (Signed)
Pt's daughter, Gillie Manners, has came in to visit the pt.  ?

## 2021-09-17 NOTE — Assessment & Plan Note (Signed)
--  resolved, discontinue sitter per psychiatry ?

## 2021-09-17 NOTE — Consult Note (Signed)
?  Ortho consult for necrotic left great toe. Patient reports this pain has been present and progressive over past several months and especially in the last couple of weeks. Her PMH is notable for polysubstance abuse, cirrhosis, undernourished found to have hepatic abscess with portal vein thrombosis, GIB, candidal esophagitis and presumed MSSA disseminated infection due to gangrenous left great toe and left hand skin lesion. She is currently admitted to hospitalist team for ongoing MSSA disseminated infection. ? ? Psychiatry psychiatry consult placed for suicidal ideations.  Patient endorsed suicidal ideations prior to surgical procedure to remove her left great toe.  Patient is seen and attempted to assess, however patient declines psychiatric evaluation citing increase in pain.  Patient is observed to be wailing and flailing in bed.  She is observed to be rocking, moaning, and grimacing at the mention and or suggestion of her foot.  She even refuses to allow the orthopedic surgeon, to assess the surgical site to check for any signs of worsening infection.  Patient continues to cry out for pain medication, while refusing to answer any psychiatric questions.  Patient states" I know where I met, I know who I am, I know why I am here, I know it today is, and I know who the president is.  I am in pain" patient then states I do not want to talk to you ma'am I am hurting. ? ?Prior to terminate in brief psychiatric evaluation, based upon patient's request to do so and to prevent further irritability and aggravation patient was asked about suicidality, homicidality, and acute psychosis in which she denies. ? ?Based off current presentation, do not suspect patient is acutely suicidal, will discontinue suicide sitter, and consider telesitter at this time. ?-Patient does appear to have maladaptive coping skills, and appears to be focused primarily on pain management at this time. ?-Will attempt to reassess patient tomorrow  once pain is more under control. ?

## 2021-09-17 NOTE — Progress Notes (Signed)
OT Cancellation Note ? ?Patient Details ?Name: Terri [name] ?MRN: [id number] ?DOB: 12/22/1975 ? ? ?Cancelled Treatment:    Reason Eval/Treat Not Completed: Other (comment). RN reports patient currently refusing all medications and stated "I'm not doing nothing" due to not being able to get Dilaudid. Will continue to follow but if she continues to refuse will sign off.   ? ?Laritza Vokes L Alfretta Pinch ?09/17/2021, 1:54 PM ?

## 2021-09-17 NOTE — Progress Notes (Signed)
Orthopedic Tech Progress Note ?Patient Details:  ?CERIAH KOHLER ?07-04-1975 ?[id number] ? ?Patient ID: KEZIYAH KNEALE, female   DOB: 04/08/1976, 46 y.o.   MRN: [id number] ? ?Kennis Carina ?09/17/2021, 9:47 AM ?Post op shoe placed in room. Patient didn't want it applied as she was in bed and foot hurt. Instructed to wear when OOB. ?

## 2021-09-17 NOTE — Progress Notes (Addendum)
Subjective: ?1 Day Post-Op Procedure(s) (LRB): ?AMPUTATION OF LEFT GREAT TOE (Left) ? ?Patient reports pain as moderate.  She is seen at bedside along with Psychiatry team and her bedside RN. She remains with sitter for suicide watch following her comments yesterday. She is grateful for the surgery and relieved regarding the amputation - however, she reports that she is dealing with some postop pain. ? ?Objective:  ? ?VITALS:  Temp:  [97.8 ?F (36.6 ?C)-100.7 ?F (38.2 ?C)] 97.8 ?F (36.6 ?C) (04/27 0800) ?Pulse Rate:  [92-115] 92 (04/27 0800) ?Resp:  [16-26] 17 (04/27 0800) ?BP: (99-130)/(65-89) 107/74 (04/27 0800) ?SpO2:  [82 %-96 %] 94 % (04/27 0829) ?Weight:  [63.9 kg] 63.9 kg (04/27 0500) ? ?Gen: AAOx3, NAD ?Comfortable at rest ? ?Left Lower Extremity: ?Skin intact, incision c/d/I over great toe stump ?TTP over incision, no active drainage ?HF/KE/KF/ADF/APF 5/5 ?SILT throughout ?DP, PT 2+ to palp ?CR < 2s ? ? ?LABS ?Recent Labs  ?  09/15/21 ?1641  ?HGB 8.0*  ?WBC 5.0  ?PLT 124*  ? ?Recent Labs  ?  09/15/21 ?1641  ?NA 133*  ?K 3.5  ?CL 103  ?CO2 27  ?BUN 11  ?CREATININE 0.45  ?GLUCOSE 84  ? ?No results for input(s): LABPT, INR in the last 72 hours. ? ? ?Assessment/Plan: ?1 Day Post-Op Procedure(s) (LRB): ?AMPUTATION OF LEFT GREAT TOE (Left) ? ?-IV abx per primary team; trend intraoperative cultures ?-maximum elevation of operative extremity to minimize swelling ?-OK to ambulate with heel weightbearing in CAM boot or postop shoe ?-bedside nursing to do daily dry dressing changes starting 09/18/21 (I have changed surgical dressing today to evaluate wound) ?-pain meds per primary team ?-VTE prophylaxis per primary team discretion (Ortho recs Aspirin 81 mg BID x 6 wk or per primary team preference) ?-discharge home per primary team ?-follow up with Ortho within 1 week from discharge from hospital for wound check [phone] for appt) ?-sutures out in 3-4 weeks in outpatient office ? ?Armond Hang ?09/17/2021, 5:05  PM ? ?

## 2021-09-17 NOTE — Progress Notes (Addendum)
Initial Nutrition Assessment ? ?INTERVENTION:  ? ?-Ensure Plus High Protein po BID, each supplement provides 350 kcal and 20 grams of protein.  ? ?-1 packet Juven BID, each packet provides 95 calories, 2.5 grams of protein (collagen), and 9.8 grams of carbohydrate (3 grams sugar); also contains 7 grams of L-arginine and L-glutamine, 300 mg vitamin C, 15 mg vitamin E, 1.2 mcg vitamin B-12, 9.5 mg zinc, 200 mg calcium, and 1.5 g  Calcium Beta-hydroxy-Beta-methylbutyrate to support wound healing  ? ?-Multivitamin with minerals daily ? ?-Check Vitamin D levels given h/o gastric bypass surgery ? ?-Placed "High Calorie, High Protein" handout in discharge instructions ? ?NUTRITION DIAGNOSIS:  ? ?Increased nutrient needs related to wound healing as evidenced by estimated needs. ? ?GOAL:  ? ?Patient will meet greater than or equal to 90% of their needs ? ?MONITOR:  ? ?PO intake, Supplement acceptance, Labs, Weight trends, I & O's, Skin ? ?REASON FOR ASSESSMENT:  ? ?Consult ?Assessment of nutrition requirement/status ? ?ASSESSMENT:  ? ?46 year old woman admitted for acute GIB. EGD showed esophageal plaques consistent with candidiasis, large cratered nonbleeding duodenal ulcer with no stigmata of bleeding.  CT angiogram on 08/31/2021 showed multiple low-attenuation lesions throughout the liver seen on recent prior exam, highly concerning for hepatic abscesses or metastasis.  Now s/p IR US guided bx and aspiration, revealing MSSA abscess. ? ?4/10: admitted ?4/13: EGD ?4/17: s/p liver biopsy ?4/23: s/p thoracentesis -1.2L yield ?4/27: s/p Left great toe disarticulation at the first metatarsophalangeal joint, I&D of left forefoot ? ?Patient in room, lying in bed, sitter at bedside. Upon arrival and introducing myself, pt then asked for RD to "come back later". Explained to pt that RD was trying to help her with healing of her left foot, pt states "I have what I need". Pt did agree for RD to order Ensures and other supplements to  aid in wound healing. Requests strawberry flavor. Will attempt at a later time to speak with pt regarding history of gastric bypass surgery. Per chart review, pt had surgery in 2012. Will place handouts in discharge instructions as well for high calorie, high protein diet.  ? ?Suspect pt with some degree of malnutrition. At risk for vitamin/mineral deficiencies given history of gastric bypass surgery, degree of wounds, h/o substance abuse and cirrhosis.  ?Noted pt with history of iron, folate deficiencies. Noted that pt is currently homeless and has no PCP.  ? ?Per nursing documentation, BUE edema present. ?Admission weight: 117 lbs ?Current weight: 140 lbs ? ?Medications: Folic acid, MAG-OX, Remeron, Carafate, Vitamin B-12 ? ?Labs reviewed: ? Low Na ?Vit B-12 WNL ? ?NUTRITION - FOCUSED PHYSICAL EXAM: ? ?Patient did not want to participate in assessment at this time. Asked RD to come back later. ? ?Diet Order:   ?Diet Order   ? ?       ?  Diet regular Room service appropriate? Yes; Fluid consistency: Thin  Diet effective now       ?  ? ?  ?  ? ?  ? ? ?EDUCATION NEEDS:  ? ?Not appropriate for education at this time ? ?Skin:  Skin Assessment: Skin Integrity Issues: ?Skin Integrity Issues:: Other (Comment) ?Other: left forefoot abscess, left great toe osteomyelitis ? ?Last BM:  4/26 ? ?Height:  ? ?Ht Readings from Last 1 Encounters:  ?09/03/21 '5\' 9"'$  (1.753 m)  ? ? ?Weight:  ? ?Wt Readings from Last 1 Encounters:  ?09/17/21 63.9 kg  ? ? ?BMI:  Body mass index is 20.8 kg/m?Marland Kitchen ? ?  Estimated Nutritional Needs:  ? ?Kcal:  1900-2100 ? ?Protein:  90-105g ? ?Fluid:  2L/day ? ?Clayton Bibles, MS, RD, LDN ?Inpatient Clinical Dietitian ?Contact information available via Amion ? ?

## 2021-09-17 NOTE — Progress Notes (Addendum)
2155 Pt has fever 103.1/ Offered pt Tylenol 650 mg PO pt refused. Pt stated "I'm not putting no more shit in my body that doesn't help". Educated pt regarding Tylenol will ?help with fever and associated aches and pains. Pt still refuses. Will continue to monitor. Pt non-compliant keeping nasal cannula on.  ? ?0540 Pt agitated refusing getting cleaned up after urinating on self. Was able to changed bed pad and gown with pt yelling at [place]. Refusing bath. Placed purewick back in and pt pulled it out and refused it while incontinent. Pt refused labs as well. Will continue to monitor ?

## 2021-09-17 NOTE — Progress Notes (Addendum)
? ? ?  OVERNIGHT PROGRESS REPORT ? ?Notified by PACU RN in reference to [facility] transfer due to expected surgery that has since been scheduled for this location ([facility]). ? ?Surgery is completed here ([facility]) and have been notified that transfer is no longer needed. Patient is to return to assigned room at [facility].Marland Kitchen ? ? ?Gershon Cull MSNA MSN ACNPC-AG ?Acute Care Nurse Practitioner ?[facility] ?[place] ? ? ? ?

## 2021-09-17 NOTE — Progress Notes (Signed)
PT Cancellation Note ? ?Patient Details ?Name: Terri [name] ?MRN: [id number] ?DOB: [birth year] ? ? ?Cancelled Treatment:    Reason Eval/Treat Not Completed: Pain limiting ability to participate ? ?[name] PT ?Acute Rehabilitation Services ?Pager [phone] ?Office [phone] ? ?[name], Shella Maxim ?09/17/2021, 5:12 PM ?

## 2021-09-17 NOTE — Anesthesia Procedure Notes (Signed)
Procedure Name: Intubation ?Date/Time: 09/17/2021 12:20 AM ?Performed by: Lissa Morales, CRNA ?Pre-anesthesia Checklist: Patient identified, Emergency Drugs available, Suction available and Patient being monitored ?Patient Re-evaluated:Patient Re-evaluated prior to induction ?Oxygen Delivery Method: Circle system utilized ?Preoxygenation: Pre-oxygenation with 100% oxygen ?Induction Type: IV induction, Rapid sequence and Cricoid Pressure applied ?Laryngoscope Size: Mac and 4 ?Tube type: Oral ?Tube size: 7.0 mm ?Number of attempts: 1 ?Airway Equipment and Method: Stylet and Oral airway ?Placement Confirmation: ETT inserted through vocal cords under direct vision, positive ETCO2 and breath sounds checked- equal and bilateral ?Secured at: 22 cm ?Tube secured with: Tape ?Dental Injury: Teeth and Oropharynx as per pre-operative assessment  ? ? ? ? ?

## 2021-09-17 NOTE — Transfer of Care (Addendum)
Immediate Anesthesia Transfer of Care Note ? ?Patient: Terri [name] ? ?Procedure(s) Performed: AMPUTATION OF LEFT GREAT TOE (Left: Toe) ? ?Patient Location: PACU ? ?Anesthesia Type:General ? ?Level of Consciousness: awake, sedated and patient cooperative ? ?Airway & Oxygen Therapy: Patient Spontanous Breathing and Patient connected to face mask oxygen ? ?Post-op Assessment: Report given to RN, Post -op Vital signs reviewed and stable and Patient moving all extremities X 4 ? ?Post vital signs: stable ? ?Last Vitals:  ?Vitals Value Taken Time  ?BP 130/65 09/17/21 0130  ?Temp 36.8 ?C 09/17/21 0130  ?Pulse 101 09/17/21 0144  ?Resp 21 09/17/21 0144  ?SpO2 89 % 09/17/21 0144  ?Vitals shown include unvalidated device data. ? ?Last Pain:  ?Vitals:  ? 09/16/21 2052  ?TempSrc: Oral  ?PainSc:   ?   ? ?Patients Stated Pain Goal: 4 (09/16/21 2030) ? ?Complications: No notable events documented. ?

## 2021-09-18 ENCOUNTER — Encounter (HOSPITAL_COMMUNITY)
Admission: EM | Disposition: A | Discharge status: Skilled Nursing Facility | Payer: Self-pay | Source: Home / Self Care | Attending: Family Medicine

## 2021-09-18 ENCOUNTER — Other Ambulatory Visit (HOSPITAL_COMMUNITY): Payer: Medicaid Other

## 2021-09-18 ENCOUNTER — Encounter (HOSPITAL_COMMUNITY): Payer: Self-pay | Admitting: Internal Medicine

## 2021-09-18 ENCOUNTER — Inpatient Hospital Stay (HOSPITAL_COMMUNITY): Payer: Medicaid Other

## 2021-09-18 ENCOUNTER — Encounter (HOSPITAL_COMMUNITY): Payer: Self-pay | Admitting: Certified Registered Nurse Anesthetist

## 2021-09-18 DIAGNOSIS — Y95 Nosocomial condition: Secondary | ICD-10-CM | POA: Diagnosis not present

## 2021-09-18 DIAGNOSIS — F09 Unspecified mental disorder due to known physiological condition: Secondary | ICD-10-CM

## 2021-09-18 DIAGNOSIS — A4901 Methicillin susceptible Staphylococcus aureus infection, unspecified site: Secondary | ICD-10-CM | POA: Diagnosis not present

## 2021-09-18 DIAGNOSIS — J9601 Acute respiratory failure with hypoxia: Secondary | ICD-10-CM | POA: Diagnosis not present

## 2021-09-18 DIAGNOSIS — M869 Osteomyelitis, unspecified: Secondary | ICD-10-CM

## 2021-09-18 DIAGNOSIS — J189 Pneumonia, unspecified organism: Secondary | ICD-10-CM | POA: Diagnosis not present

## 2021-09-18 LAB — CBC WITH DIFFERENTIAL/PLATELET
Abs Immature Granulocytes: 0.06 10*3/uL (ref 0.00–0.07)
Basophils Absolute: 0 10*3/uL (ref 0.0–0.1)
Basophils Relative: 0 %
Eosinophils Absolute: 0 10*3/uL (ref 0.0–0.5)
Eosinophils Relative: 0 %
HCT: 28 % — ABNORMAL LOW (ref 36.0–46.0)
Hemoglobin: 9 g/dL — ABNORMAL LOW (ref 12.0–15.0)
Immature Granulocytes: 1 %
Lymphocytes Relative: 20 %
Lymphs Abs: 1 10*3/uL (ref 0.7–4.0)
MCH: 24.9 pg — ABNORMAL LOW (ref 26.0–34.0)
MCHC: 32.1 g/dL (ref 30.0–36.0)
MCV: 77.6 fL — ABNORMAL LOW (ref 80.0–100.0)
Monocytes Absolute: 0.1 10*3/uL (ref 0.1–1.0)
Monocytes Relative: 3 %
Neutro Abs: 3.8 10*3/uL (ref 1.7–7.7)
Neutrophils Relative %: 76 %
Platelets: 179 10*3/uL (ref 150–400)
RBC: 3.61 MIL/uL — ABNORMAL LOW (ref 3.87–5.11)
RDW: 24.1 % — ABNORMAL HIGH (ref 11.5–15.5)
WBC: 5 10*3/uL (ref 4.0–10.5)
nRBC: 0 % (ref 0.0–0.2)

## 2021-09-18 LAB — SURGICAL PATHOLOGY

## 2021-09-18 LAB — GLUCOSE, CAPILLARY
Glucose-Capillary: 228 mg/dL — ABNORMAL HIGH (ref 70–99)
Glucose-Capillary: 55 mg/dL — ABNORMAL LOW (ref 70–99)
Glucose-Capillary: 173 mg/dL — ABNORMAL HIGH (ref 70–99)
Glucose-Capillary: 103 mg/dL — ABNORMAL HIGH (ref 70–99)

## 2021-09-18 LAB — VITAMIN D 25 HYDROXY (VIT D DEFICIENCY, FRACTURES): Vit D, 25-Hydroxy: 28.02 ng/mL — ABNORMAL LOW (ref 30–100)

## 2021-09-18 LAB — COMPREHENSIVE METABOLIC PANEL
ALT: <5 U/L (ref 0–44)
AST: 29 U/L (ref 15–41)
Albumin: <1.5 g/dL — ABNORMAL LOW (ref 3.5–5.0)
Alkaline Phosphatase: 138 U/L — ABNORMAL HIGH (ref 38–126)
Anion gap: 4 — ABNORMAL LOW (ref 5–15)
BUN: 13 mg/dL (ref 6–20)
CO2: 30 mmol/L (ref 22–32)
Calcium: 7 mg/dL — ABNORMAL LOW (ref 8.9–10.3)
Chloride: 97 mmol/L — ABNORMAL LOW (ref 98–111)
Creatinine, Ser: 0.53 mg/dL (ref 0.44–1.00)
GFR, Estimated: >60 mL/min (ref >60)
Glucose, Bld: 57 mg/dL — ABNORMAL LOW (ref 70–99)
Potassium: 3.2 mmol/L — ABNORMAL LOW (ref 3.5–5.1)
Sodium: 131 mmol/L — ABNORMAL LOW (ref 135–145)
Total Bilirubin: 0.3 mg/dL (ref 0.3–1.2)
Total Protein: 4.6 g/dL — ABNORMAL LOW (ref 6.5–8.1)

## 2021-09-18 LAB — BLOOD GAS, ARTERIAL
Acid-Base Excess: 6.1 mmol/L — ABNORMAL HIGH (ref 0.0–2.0)
Bicarbonate: 31.2 mmol/L — ABNORMAL HIGH (ref 20.0–28.0)
O2 Saturation: 95.3 %
Patient temperature: 37
pCO2 arterial: 46 mmHg (ref 32–48)
pH, Arterial: 7.44 (ref 7.35–7.45)
pO2, Arterial: 61 mmHg — ABNORMAL LOW (ref 83–108)

## 2021-09-18 LAB — PROTIME-INR
INR: 1.2 (ref 0.8–1.2)
Prothrombin Time: 15.1 seconds (ref 11.4–15.2)

## 2021-09-18 LAB — LACTIC ACID, PLASMA
Lactic Acid, Venous: 0.8 mmol/L (ref 0.5–1.9)
Lactic Acid, Venous: 1.5 mmol/L (ref 0.5–1.9)

## 2021-09-18 LAB — APTT: aPTT: 37 seconds — ABNORMAL HIGH (ref 24–36)

## 2021-09-18 SURGERY — CANCELLED PROCEDURE

## 2021-09-18 MED ORDER — HALOPERIDOL LACTATE 5 MG/ML IJ SOLN: 5.0000 mg | Freq: Two times a day (BID) | INTRAMUSCULAR | Status: DC | PRN

## 2021-09-18 MED ORDER — SODIUM CHLORIDE 0.9 % IV SOLN
3.0000 g | Freq: Four times a day (QID) | INTRAVENOUS | Status: DC
  Administered 2021-09-18 – 2021-09-22 (×17): 3 g via INTRAVENOUS
  Filled 2021-09-18 (×17): qty 8

## 2021-09-18 MED ORDER — DEXTROSE 5 % IN LACTATED RINGERS IV BOLUS
1000.0000 mL | Freq: Once | INTRAVENOUS | Status: AC
  Administered 2021-09-18: 1000 mL via INTRAVENOUS

## 2021-09-18 MED ORDER — PIPERACILLIN-TAZOBACTAM 3.375 G IVPB
3.3750 g | Freq: Three times a day (TID) | INTRAVENOUS | Status: DC
Start: 2021-09-18 — End: 2021-09-18

## 2021-09-18 MED ORDER — SODIUM CHLORIDE 0.9 % IV SOLN
INTRAVENOUS | Status: DC | PRN
Start: 2021-09-18 — End: 2021-09-27

## 2021-09-18 MED ORDER — DEXTROSE 50 % IV SOLN
INTRAVENOUS | Status: AC
  Administered 2021-09-18: 50 mL via INTRAVENOUS
  Filled 2021-09-18: qty 50

## 2021-09-18 MED ORDER — IOHEXOL 300 MG/ML  SOLN
100.0000 mL | Freq: Once | INTRAMUSCULAR | Status: AC | PRN
  Administered 2021-09-18: 75 mL via INTRAVENOUS

## 2021-09-18 MED ORDER — LORAZEPAM 2 MG/ML IJ SOLN: 2.0000 mg | Freq: Two times a day (BID) | INTRAMUSCULAR | Status: DC | PRN

## 2021-09-18 MED ORDER — ORAL CARE MOUTH RINSE: 15.0000 mL | Freq: Two times a day (BID) | OROMUCOSAL | Status: DC

## 2021-09-18 MED ORDER — SODIUM CHLORIDE (PF) 0.9 % IJ SOLN
INTRAMUSCULAR | Status: AC
  Filled 2021-09-18: qty 50

## 2021-09-18 MED ORDER — LACTATED RINGERS IV BOLUS (SEPSIS): 1000.0000 mL | Freq: Once | INTRAVENOUS | Status: DC

## 2021-09-18 MED ORDER — LACTATED RINGERS IV BOLUS (SEPSIS)
1000.0000 mL | Freq: Once | INTRAVENOUS | Status: AC
  Administered 2021-09-18: 1000 mL via INTRAVENOUS

## 2021-09-18 MED ORDER — SODIUM CHLORIDE 0.9 % IV SOLN: INTRAVENOUS | Status: DC

## 2021-09-18 MED ORDER — DEXTROSE 50 % IV SOLN
50.0000 mL | INTRAVENOUS | Status: DC | PRN
  Administered 2021-09-20 (×2): 50 mL via INTRAVENOUS
  Administered 2021-09-28 – 2021-09-29 (×3): 25 mL via INTRAVENOUS
  Filled 2021-09-18 (×7): qty 50

## 2021-09-18 MED ORDER — PIPERACILLIN-TAZOBACTAM 3.375 G IVPB 30 MIN
3.3750 g | INTRAVENOUS | Status: DC
  Administered 2021-09-18: 3.375 g via INTRAVENOUS
  Filled 2021-09-18: qty 50

## 2021-09-18 MED ORDER — POTASSIUM CHLORIDE 10 MEQ/100ML IV SOLN
10.0000 meq | INTRAVENOUS | Status: AC
  Administered 2021-09-18 (×4): 10 meq via INTRAVENOUS
  Filled 2021-09-18 (×4): qty 100

## 2021-09-18 MED ORDER — FENTANYL CITRATE PF 50 MCG/ML IJ SOSY
PREFILLED_SYRINGE | INTRAMUSCULAR | Status: AC
  Filled 2021-09-18: qty 1

## 2021-09-18 MED ORDER — VANCOMYCIN HCL IN DEXTROSE 1-5 GM/200ML-% IV SOLN
1000.0000 mg | Freq: Two times a day (BID) | INTRAVENOUS | Status: DC
  Administered 2021-09-19 – 2021-09-21 (×6): 1000 mg via INTRAVENOUS
  Filled 2021-09-18 (×7): qty 200

## 2021-09-18 MED ORDER — VANCOMYCIN HCL 1250 MG/250ML IV SOLN
1250.0000 mg | INTRAVENOUS | Status: AC
  Administered 2021-09-18: 1250 mg via INTRAVENOUS
  Filled 2021-09-18: qty 250

## 2021-09-18 NOTE — Progress Notes (Signed)
OT Cancellation Note ? ?Patient Details ?Name: Terri [name] ?MRN: [id number] ?DOB: Oct 11, 1975 ? ? ?Cancelled Treatment:    Reason Eval/Treat Not Completed: Other (comment) Spoke with PT states nursing staff believes patient may have aspirated. Patient not looking well. Will re-attempt 4/29 as schedule permits. ? ?Delbert Phenix OT ?OT pager: [phone] ? ? ?Rosemary Holms ?09/18/2021, 12:49 PM ?

## 2021-09-18 NOTE — TOC Progression Note (Signed)
Transition of Care (TOC) - Progression Note  ? ? ?Patient Details  ?Name: Terri [name] ?MRN: [id number] ?Date of Birth: 26-Mar-1976 ? ?Transition of Care (TOC) CM/SW Contact  ?Purcell Mouton, RN ?Phone Number: ?09/18/2021, 12:17 PM ? ?Clinical Narrative:    ? ?IVC paperwork completed. [employer] was called.  ? ?Expected Discharge Plan: Home/Self Care ?Barriers to Discharge: Continued Medical Work up ? ?Expected Discharge Plan and Services ?Expected Discharge Plan: Home/Self Care ?  ?Discharge Planning Services: CM Consult ?  ?Living arrangements for the past 2 months: No permanent address ?                ?  ?  ?  ?  ?  ?  ?  ?  ?  ?  ? ? ?Social Determinants of Health (SDOH) Interventions ?  ? ?Readmission Risk Interventions ? ?  09/02/2021  ?  2:25 PM  ?Readmission Risk Prevention Plan  ?Transportation Screening Complete  ?Medication Review Press photographer) Complete  ?PCP or Specialist appointment within 3-5 days of discharge Complete  ?Palliative Care Screening Not Applicable  ?[place] Not Applicable  ? ? ?

## 2021-09-18 NOTE — Progress Notes (Addendum)
Chemistry resulted glucose 57. Repeat fingerstick 55. 1 Amp D50 given. Rapid response RN and MD en route.  ? ?1200 Staggering ABX as patient refusing second IV. ?

## 2021-09-18 NOTE — Progress Notes (Signed)
?   ? ? ? ? ?[place] for Infectious Disease ? ?Date of Admission:  08/31/2021    ? ? ?Total days of antibiotics 17  ?Vancomycin 4/28 >>  ?Unasyn 4/28 >> ?Fluconazole  ? ? ?ASSESSMENT: ?Terri [name] is a 46 y.o. female admitted with disseminated MSSA infection involving multiple liver abscesses, foot infection (now s/p amputation) and concern over endocarditis.  ?Respiratory decompensation over night with fevers, increased oxygen requirements and worsening right upper chest consolidation. Antibiotics were broadened overnight to vancomycin/zosyn and blood cultures were taken with high fevers to 103. Suspect acute aspiration event on top of likely necrotizing pneumonia. With nasal MRSA PCR + would keep vancomycin for treatment; narrow the zosyn to unasyn to cover known MSSA, prevotela in foot and aspiration.  ? ?Esophageal candidiasis - continue fluconazole.  ? ? ? ?PLAN: ?Continue vancomycin  ?Change zosyn to unasyn  ?Follow respiratory status closely  ?Probably just defer TEE and she will get a longer course of therapy  ? ? ?Active Problems: ?  Hypokalemia ?  Polysubstance abuse ([place]) ?  Acute on chronic blood loss anemia ?  HAP with associated acute hypoxic respiratory failure ?  Portal vein thrombosis ?  Candida infection, esophageal ([place]) ?  Hepatic abscess ?  Swelling of left hand ?  Gangrene of toe of left foot ([place]) ?  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  Ascites ?  Exudative pleural effusion ?  Vaginal discharge ?  Osteomyelitis of great toe of left foot ([place]) ? ? ? amLODipine  10 mg Oral Daily  ? Chlorhexidine Gluconate Cloth  6 each Topical Q0600  ? feeding supplement  237 mL Oral BID BM  ? fluconazole  200 mg Oral Daily  ? folic acid  1 mg Oral Daily  ? guaiFENesin  600 mg Oral BID  ? hydrALAZINE  50 mg Oral BID  ? lidocaine  1 patch Transdermal Daily  ? lisinopril  10 mg Oral QHS  ? magnesium oxide  400 mg Oral BID  ? mirtazapine  7.5 mg  Oral QHS  ? multivitamin with minerals  1 tablet Oral Daily  ? mupirocin ointment  1 application. Nasal BID  ? nicotine  21 mg Transdermal Daily  ? nutrition supplement (JUVEN)  1 packet Oral BID WC  ? pantoprazole  40 mg Oral BID  ? sucralfate  1 g Oral TID WC & HS  ? vitamin B-12  1,000 mcg Oral Daily  ? ? ?SUBJECTIVE: ?She is having some chest heaviness, SOB and wheezing/rhonchi. She says her breathing is not good and has been worse the last 12 hours.  ? ? ?Review of Systems: ?Review of Systems  ?Unable to perform ROS: Severity of pain  ? ? ?Allergies  ?Allergen Reactions  ? Nsaids Other (See Comments)  ?  Stomach ulcer ?  ? Ketorolac Tromethamine Itching and Nausea And Vomiting  ? ? ?OBJECTIVE: ?Vitals:  ? 09/18/21 1100 09/18/21 1200 09/18/21 1255 09/18/21 1400  ?BP: 100/69 90/62 (!) 89/59 90/62  ?Pulse: 89 (!) 108 (!) 102 97  ?Resp:   (!) 26 14  ?Temp:   98 ?F (36.7 ?C)   ?TempSrc:   Oral   ?SpO2: 92% 91% 93% 93%  ?Weight:      ?Height:      ? ?Body mass index is 20.51 kg/m?. ? ? ?Physical Exam ?Vitals reviewed.  ?Constitutional:   ?   Appearance: She is ill-appearing.  ?Cardiovascular:  ?  Rate and Rhythm: Normal rate.  ?Pulmonary:  ?   Effort: Pulmonary effort is normal.  ?   Breath sounds: Wheezing and rhonchi present.  ?   Comments: Frequent coughing and chest congestion  ?She is on 5-8 LPM Oxygen now ?Musculoskeletal:  ?   Comments: Foot wrapped in clean and dry dressing  ?Skin: ?   General: Skin is warm and dry.  ?Neurological:  ?   Mental Status: She is alert and oriented to person, place, and time.  ? ? ? ?Lab Results ?Lab Results  ?Component Value Date  ? WBC 5.0 09/18/2021  ? HGB 9.0 (L) 09/18/2021  ? HCT 28.0 (L) 09/18/2021  ? MCV 77.6 (L) 09/18/2021  ? PLT 179 09/18/2021  ?  ?Lab Results  ?Component Value Date  ? CREATININE 0.53 09/18/2021  ? BUN 13 09/18/2021  ? NA 131 (L) 09/18/2021  ? K 3.2 (L) 09/18/2021  ? CL 97 (L) 09/18/2021  ? CO2 30 09/18/2021  ?  ?Lab Results  ?Component Value Date  ? ALT  <5 09/18/2021  ? AST 29 09/18/2021  ? ALKPHOS 138 (H) 09/18/2021  ? BILITOT 0.3 09/18/2021  ?  ? ?Microbiology: ?Recent Results (from the past 240 hour(s))  ?Body fluid culture w Gram Stain     Status: None  ? Collection Time: 09/13/21 10:51 AM  ? Specimen: PATH Cytology Pleural fluid  ?Result Value Ref Range Status  ? Specimen Description   Final  ?  PERITONEAL ?Performed at [facility], [place] [name] [place], [place] [address] ?  ? Special Requests   Final  ?  NONE ?Performed at [facility], [place] [address], [place], [place] [address] ?  ? Gram Stain   Final  ?  NO SQUAMOUS EPITHELIAL CELLS SEEN ?FEW WBC SEEN ?NO ORGANISMS SEEN ?  ? Culture   Final  ?  NO GROWTH 3 DAYS ?Performed at [facility], [place] [address], [place], [place] [address] ?  ? Report Status 09/16/2021 FINAL  Final  ?MRSA Next Gen by PCR, Nasal     Status: Abnormal  ? Collection Time: 09/15/21  9:22 AM  ? Specimen: Nasal Mucosa; Nasal Swab  ?Result Value Ref Range Status  ? MRSA by PCR Next Gen DETECTED (A) NOT DETECTED Final  ?  Comment: (NOTE) ?The GeneXpert MRSA Assay (FDA approved for NASAL specimens only), ?is one component of a comprehensive MRSA colonization surveillance ?program. It is not intended to diagnose MRSA infection nor to guide ?or monitor treatment for MRSA infections. ?Test performance is not FDA approved in patients less than 2 years ?old. ?Performed at [facility], [place] [address] ?[place], [place] [address] ?  ?Urine Culture     Status: None  ? Collection Time: 09/15/21  9:45 PM  ? Specimen: Urine, Clean Catch  ?Result Value Ref Range Status  ? Specimen Description   Final  ?  URINE, CLEAN CATCH ?Performed at [facility], [place] [address], [place], [place] [address] ?  ? Special Requests   Final  ?  NONE ?Performed at [facility], [place] [address], [place], [place] [address] ?  ? Culture   Final  ?  NO GROWTH ?Performed at  [facility], [place] [address] [place], [place] [address] ?  ? Report Status 09/17/2021 FINAL  Final  ?Aerobic/Anaerobic Culture w Gram Stain (surgical/deep wound)     Status: None (Preliminary result)  ? Collection  Time: 09/17/21 12:39 AM  ? Specimen: PATH Other; Tissue  ?Result Value Ref Range Status  ? Specimen Description   Final  ?  ABSCESS LEFT FOOT ?Performed at [facility], [place] [address], [place], [place] [address] ?  ? Special Requests   Final  ?  NONE ?Performed at [facility], [place] [address] [place], [place] [address] ?  ? Gram Stain NO WBC SEEN ?RARE GRAM POSITIVE COCCI IN PAIRS ?  Final  ? Culture   Final  ?  MODERATE STAPHYLOCOCCUS AUREUS ?SUSCEPTIBILITIES TO FOLLOW ?Performed at [facility], [place] [address], [place], [place] [address] ?  ? Report Status PENDING  Incomplete  ?Aerobic/Anaerobic Culture w Gram Stain (surgical/deep wound)     Status: None (Preliminary result)  ? Collection Time: 09/17/21  1:02 AM  ? Specimen: PATH Other; Tissue  ?Result Value Ref Range Status  ? Specimen Description   Final  ?  ABSCESS LEFT GREAT TOE ?Performed at [facility], [place] [address] [place], [place] [address] ?  ? Special Requests   Final  ?  NONE ?Performed at [facility], [place] [address], [place], [place] [address] ?  ? Gram Stain   Final  ?  MODERATE WBC PRESENT,BOTH PMN AND MONONUCLEAR ?RARE GRAM POSITIVE COCCI IN CLUSTERS ?  ? Culture   Final  ?  ABUNDANT STAPHYLOCOCCUS AUREUS ?SUSCEPTIBILITIES TO FOLLOW ?Performed at [facility], [place] [address], [place], [place] [address] ?  ? Report Status PENDING  Incomplete  ?Culture, blood (Routine X 2) w Reflex to ID Panel     Status: None (Preliminary result)  ? Collection Time: 09/17/21  7:08 PM  ? Specimen: BLOOD  ?Result Value Ref Range Status  ? Specimen Description   Final  ?  BLOOD BLOOD RIGHT HAND ?Performed at [facility], [place] [address] [place], [place] [address] ?  ? Special Requests   Final  ?  BOTTLES DRAWN AEROBIC ONLY Blood Culture adequate volume ?Performed at [facility], [place] [address] [place], [place]

## 2021-09-18 NOTE — Evaluation (Signed)
SLP Cancellation Note ? ?Patient Details ?Name: Terri [name] ?MRN: [id number] ?DOB: [birth year] ? ? ?Cancelled treatment:       Reason Eval/Treat Not Completed: Other (comment) (Attempted swallow evaluation, pt's RR up to 38, oxygen saturation at lowest in high 70s and mid 80's, assisted RN with placement of facemask. Will continue efforts but pt is not ready for evaluation d/t respiratory status.) ? ?[name], MS CCC SLP ?Acute Rehab Services ?Office [phone] ?Pager [phone] ? ? ?Terri [name] ?09/18/2021, 4:08 PM ? ? ? ?

## 2021-09-18 NOTE — Progress Notes (Signed)
Pharmacy Antibiotic Note ? ?Terri [name] is a 46 y.o. female admitted on 08/31/2021 with  worsening PNA .  Pharmacy has been consulted for Vanco, Zosyn dosing. ? ?ID: Desseminated MSSA infection due to gangrenous L great toe and hand lesion. Liver abscess growing staph aureus. MRI of foot shows probable osteo with abscess. S.p disarticulation and I&D on 4/26. ?- 4/27: Tmax 103.2. WBC 5, Scr <1 ? ?- 4/14 abd MRI: multi masses in the liver. A large ?burden of expansive thrombus in the left and right portal veins as well as the central portal vein. Liver masses appear to be of solid composition and are highly concerning for metastases with tumor thrombus; abscesses remain a general differential consideration. Large volume ascites throughout the abdomen. ?- 4/17: liver bx and aspiration for culture ?- 4/19 MRI of spine: no osteo seen  ? ?4/13 fluconazole >> 4/30 (candidiasis on EGD)  ?4/18 vanc>>4/19 ?4/18 Ceftriaxone> 4/19  ?4/19 Ancef>> 4/28 ?4/24 Flagyl>> 4/28 ?4/28 Vanco>> ?4/28 Zosyn>> ? ?4/17 liver abscess: staph aureus ( r to erythromycin ), bacteroides oralis, beta lactamase + ?4/18 BCx: neg ?4/23: Peritoneal fluid>>neg ?4/25: MRSA PCR + ?4/25: UCx: neg ?4/27: L foot abscess>>GPC ?4/27: L great toes: SA, f/u sens ?4/27: BC x 2>> ? ?Plan: (6 wks total abx) ?Change abx to  ?Vanco '1250mg'$  IV x 1 ?Vancomycin 1000 mg IV Q 12 hrs. Goal AUC 400-550. ?Expected AUC: 483, SCr used: 0.8 ?Zosyn 3.375g IV q8hr ? ? ? ? ?Height: '5\' 9"'$  (175.3 cm) ?Weight: 63 kg (138 lb 14.2 oz) ?IBW/kg (Calculated) : 66.2 ? ?Temp (24hrs), Avg:102 ?F (38.9 ?C), Min:98.8 ?F (37.1 ?C), Max:103.1 ?F (39.5 ?C) ? ?Recent Labs  ?Lab 09/12/21 ?0329 09/13/21 ?0321 09/14/21 ?0254 09/15/21 ?1641 09/18/21 ?0846  ?WBC 10.9* 11.6* 7.4 5.0 5.0  ?CREATININE 0.36* 0.39* 0.30* 0.45 0.53  ?LATICACIDVEN  --   --   --   --  0.8  ?  ?Estimated Creatinine Clearance: 86.5 mL/min (by C-G formula based on SCr of 0.53 mg/dL).   ? ?Allergies  ?Allergen Reactions  ? Nsaids  Other (See Comments)  ?  Stomach ulcer ?  ? Ketorolac Tromethamine Itching and Nausea And Vomiting  ? ? ?Tonio Seider S. Alford Highland, PharmD, BCPS ?Clinical Staff Pharmacist ?[email or web address] ? ?Alford Highland, [employer] ?09/18/2021 10:46 AM ? ?

## 2021-09-18 NOTE — Progress Notes (Addendum)
?Progress Note ? ? ?Patient: Terri [name] LDJ:[id number] DOB: 14-Nov-1975 DOA: 08/31/2021     17 ?DOS: the patient was seen and examined on 09/18/2021 ?  ?Brief hospital course: ?46 year old woman admitted for acute GIB. EGD showed esophageal plaques consistent with candidiasis, large cratered nonbleeding duodenal ulcer with no stigmata of bleeding.  CT angiogram showed multiple low-attenuation lesions throughout the liver seen on recent prior exam, highly concerning for hepatic abscesses or metastasis.  S/p IR US guided bx and aspiration, revealing MSSA abscess.  Workup in process for suspected endocarditis, s/p unrevealing TTE. TEE was delayed by hypoxia, planned for Friday. 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  4/28 with worsening hypoxia, chest x-ray shows worsening pneumonia.  Antibiotics adjusted by ID.  Seen by psychiatry, IVC placed, patient currently lacks capacity. ? ?Assessment and Plan: ?* Acute upper GI bleed-resolved as of 09/17/2021 ?--resolved. EGD 4/13 with esophageal plaques c/w candidiasis, large cratered non bleeding jejunal ulcer with non stigmata of bleeding.  Nodular regions more like regenerative tissue and not like visible vessels. ?PPI BID x2 months, sucralfate QID x2 weeks ? ?Acute on chronic blood loss anemia ?--secondary to GIB; stable ? ?Osteomyelitis of great toe of left foot (HCC) ?--follow intraoperative cultures ?-maximum elevation of operative extremity to minimize swelling ?-OK to ambulate with heel weightbearing in CAM boot or postop shoe ?-bedside nursing to do daily dry dressing changes starting 09/18/21 (I have changed surgical dressing today to evaluate wound) ?-pain meds per primary team ?-VTE prophylaxis per primary team discretion (Ortho recs Aspirin 81 mg BID x 6 wk or per primary team preference) ?-follow up with Ortho within 1 week from discharge from hospital for wound check ([phone]  for appt) ?-sutures out in 3-4 weeks in outpatient office ? ?Disseminated MSSA with liver abscess, left foot osteo and abscess, 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 ?--liver abnormalities on imaging, s/o biopsy compatible with abscess, aspiration 4/17 with abundant staph aureus (MSSA), abundant bacteroides oralis ?--left foot osteo and abscess -- s/p amputation and I&D 4/27 ?--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 given left great toe gangrene and wrist swelling. MRI lumbar and thoracic spine without discitis/osteomyelitis in thoracolumbar spine ?--repeat CT with contrast or MRI of liver with and without contrast in 4 weeks to confirm that liver lesions were abscess and not metastasis ?--cancel TEE given worsening respiratory status ? ?Gangrene of toe of left foot ([place]) ?--Seen by Dr. Scot Dock on 4/15, noted dry gangrene on tip of L great toe, palpable pedal pulses, thought to have adequate circulation to heal this (unclear etiology per vascular).   ?--now s/p amputation by orthopedics ? ?HAP with associated acute hypoxic respiratory failure ?-- Worsening hypoxia, up to 8 L.  Tachypnea continue with breath.  Discussed with infectious disease.  Suggested to the facility, nursing. ?-- Work-up negative for sepsis, lactic acid within normal notes, ABG, EKG, abdominal x-ray reassuring. ? ?Swelling of left hand ?--MRI limited, no fluid collection or tenosynovitis seen ? ?Exudative pleural effusion ?--Thoracentesis 4/23 with 1.2 L of turbid light yellow fluid ?Post thora CXR with severe multilobar bilateral pneumonia, most pronounced in R mid lung, needs f/u PA/lateral in 3-4 weeks following abx therapy to ensure resolution ? ?Portal vein thrombosis ?--With recent GI bleeding, hold on anticoagulation ?--Large burden of thrombus in central portal vein as well as R portal vein and branches  ?--Will need to discuss with GI, consider restarting anticoagulation in future ? ?Vaginal discharge ?--currently on metronidazole  for presumed BV ? ?Ascites ?--2  L removed during liver bx, but analysis not performed. Repeat US didn't show enough fluid for repeat paracentesis ? ?Polysubstance abuse ([place]) ?--Encourage cessation. UDS with cocaine ? ?Hypokalemia ?Replace and follow ? ?Hypophosphatemia-resolved as of 09/18/2021 ?Replace and follow ? ?Essential hypertension ?Continue lisinopril, amlodipine, hydral ?Labetalol prn ? ?Candida infection, esophageal ([place]) ?-- continue fluconazole ?-- check HIV nonreactive ?-- negative acute hepatitis panel  ? ?Suicidal ideation-resolved as of 09/17/2021 ?--resolved, discontinue sitter per psychiatry ? ?Lactic acidosis-resolved as of 09/17/2021 ? ?NPO, ST consult. ?  ? ?Subjective:  ?Refused PT, OT ?Was not willing to talk to Psychiatry NP ?Fever 103.1 ?Agitated overnight, refused bath, pulled out purewick ?Increased oxygen requirement and hypoxic ?Complains of foot pain, back, chest, abdominal; SOB ? ?Physical Exam: ?Vitals:  ? 09/18/21 1000 09/18/21 1100 09/18/21 1200 09/18/21 1255  ?BP: 93/67 100/69 90/62 (!) 89/59  ?Pulse: 93 89 (!) 108 (!) 102  ?Resp:    (!) 26  ?Temp:    98 ?F (36.7 ?C)  ?TempSrc:    Oral  ?SpO2: (!) 88% 92% 91% 93%  ?Weight:      ?Height:      ? ?Physical Exam ?Vitals and nursing note reviewed.  ?Constitutional:   ?   General: She is in acute distress.  ?   Appearance: She is ill-appearing and toxic-appearing. She is not diaphoretic.  ?Cardiovascular:  ?   Rate and Rhythm: Normal rate and regular rhythm.  ?   Heart sounds: No murmur heard. ?Pulmonary:  ?   Effort: No respiratory distress.  ?   Breath sounds: Normal breath sounds. No wheezing, rhonchi or rales.  ?   Comments: tachypneic ?Abdominal:  ?   General: Abdomen is flat. There is no distension.  ?   Tenderness: There is abdominal tenderness (generalized). There is no guarding or rebound.  ?Musculoskeletal:  ?   Right lower leg: No edema.  ?   Left lower leg: No edema.  ?   Comments: Left foot dressd  ?Skin: ?   General: Skin is warm and dry.  ?   Findings: No  erythema or rash.  ?Neurological:  ?   General: No focal deficit present.  ?   Mental Status: She is alert and oriented to person, place, and time.  ?Psychiatric:     ?   Mood and Affect: Mood normal.     ?   Behavior: Behavior normal.  ?Oriented to self, location, month, year, reason for hospitalization ? ?Data Reviewed: ? ?K+ 3/2 ?Lactic acid WNL ?Hgb stable at 9.0, WBC WNL ?CXR independent review, increased infiltrates right lung ?AXR no acute disease ?EKG ST, no acute changes ?ABG reassuring ? ?Family Communication: 2 daughters at bedside ? ?Disposition: ?Status is: Inpatient ?Remains inpatient appropriate because: febrile, tachypneic, worsening hypoxia; disseminated MSSA ? Planned Discharge Destination: Home homeless ? ? ? ?Time spent: 40 minutes ? ?Author: ?Murray Hodgkins, MD ?09/18/2021 1:37 PM ? ?For on call review [email or web address].  ?

## 2021-09-18 NOTE — Progress Notes (Signed)
Multiple evaluations throughout the day. ?CTSP by RN within last hour for worsened hypoxia, requiring nonrebreather ?By arrival patient was weaned back to 8L ? ?S: "I'm ok". A little SOB. "I want to eat". ?O: SBP 100s, HR low 100s, RR now in 40s ?Physical Exam ?Constitutional:   ?   General: She is in acute distress.  ?   Appearance: She is ill-appearing. She is not diaphoretic.  ?Cardiovascular:  ?   Rate and Rhythm: Normal rate and regular rhythm.  ?   Heart sounds: No murmur heard. ?Pulmonary:  ?   Effort: Respiratory distress present.  ?   Breath sounds: Rhonchi present. No wheezing or rales.  ?   Comments: Tachypneic, some retractions noted, dyspneic, moderate increased WOB ?Musculoskeletal:  ?   Right lower leg: No edema.  ?   Left lower leg: No edema.  ?Neurological:  ?   Mental Status: She is alert.  ?Psychiatric:     ?   Mood and Affect: Mood normal.     ?   Behavior: Behavior normal.  ?  ?A/P ?Worsening acute hypoxic respiratory failure, increased respiratory effort, worsening pneumonia on CXR, with onset of fever last night, in pt on cefazolin and metronidazole for presumed disseminated MSSA, known liver abscess and s/p left great toe amputation for acute osteomyelitis and abcess. Prognosis guarded.  ? ?Concern for complicated pneumonia, possible empyema. No reported aspiration events. ? ?Pt confirms intubation acceptable. No indication at this point. ? ?Plan: transfer to SDU, escalate oxygen supplemnetation as needed, STAT chest CT to rule out complicating features. Frequent reassessment. Will involve PCCM if condition worsens. ? ?Murray Hodgkins, MD ?[facility] ? ?

## 2021-09-18 NOTE — Progress Notes (Signed)
0740 Patient rec'd on 8L [place]. Saturation 85%. Wheezing. MD and RT made aware. Bloodwork drawn. Patient repositioned. MD at bedside for eval. Order's rec'd. Neb given Sat now 91% on 5L [place]. ?

## 2021-09-18 NOTE — Progress Notes (Signed)
Patients belongings that were locked up on 4W were sent down to ICU with patient.  ?

## 2021-09-18 NOTE — Sepsis Progress Note (Signed)
Code sepsis protocol being monitored by elink. Blood cultures were drawn at 1908 on 4/27.  Flagyl and ancef started at 2233 and 2358 respectively on 4/27. Ancef repeated at [place].   Secure chat with RN to ensure lactic acid and IVF boluses ordered are drawn and given as soon as she gets a chance. ?

## 2021-09-18 NOTE — Assessment & Plan Note (Addendum)
--  follow intraoperative cultures -- abundant Staph aureus ?--maximum elevation?of operative extremity to minimize swelling ?--OK to ambulate with heel weightbearing in CAM boot or postop shoe ?--bedside nursing to do daily dry dressing changes- ?-VTE prophylaxis (Ortho recs Aspirin 81 mg BID x 6 wk or per primary team preference) ?--follow up with Ortho within 1 week from discharge from hospital for wound check ([phone] for appt) ?--sutures out in 3-4?weeks in outpatient office ?

## 2021-09-18 NOTE — Progress Notes (Signed)
?  PT Cancellation Note ? ?Patient Details ?Name: Terri [name] ?MRN: [id number] ?DOB: September 20, 1975 ? ? ?Cancelled Treatment:    Reason Eval/Treat Not Completed: Medical issues which prohibited therapy, respiratory distress . Will check back 5/1/ ?[name] PT ?Acute Rehabilitation Services ?Pager [phone] ?Office [phone] ? ? ? ?Arista Kettlewell, Shella Maxim ?09/18/2021, 11:32 AM ?

## 2021-09-18 NOTE — Consult Note (Signed)
?  Ortho consult for necrotic left great toe. Patient reports this pain has been present and progressive over past several months and especially in the last couple of weeks. Her PMH is notable for polysubstance abuse, cirrhosis, undernourished found to have hepatic abscess with portal vein thrombosis, GIB, candidal esophagitis and presumed MSSA disseminated infection due to gangrenous left great toe and left hand skin lesion. She is currently admitted to hospitalist team for ongoing MSSA disseminated infection. ? ? Psychiatry psychiatry consult placed for suicidal ideations.  Patient endorsed suicidal ideations prior to surgical procedure to remove her left great toe.  Patient is seen and attempted to assess, however patient declines psychiatric evaluation again. Upon entering room and introduces self, patient presents very irritable and labile, she does not offer a welcome, and immediately refuses to discuss anything further with me. She does introduce her daughters, but refuses to give me consent to speak with them. She is heard to be arguing with nurse as she is refusing her nebulizer, while she is observed to be in mild respiratory distress. This nurse practitioner originally attempted to encourage patient and offer support to take medications as she is observed to be decompensating. Patient states "if I cant have anything to drink or no pain medicine, im not putting anything in my mouth." As she continues to argue with nursing, nurse practitioner attempted to complete a capacity evaluation as she does not appear to have insight or judgment into medical decision making.   Patient states " I am sick very sick. I am not refusing my medicine. " Patient is attempted to be redirected and clarification is sought as she is currently refusing her medication. She is unable to forward appreciation, risks vs benefits, reasoning.  Patient continues to refuse medication, despite provider explaining the benefits of helping  her. She also does not understand as to why she is NPO 2/t her respiratory distress.  ? ?Based off current presentation, do not suspect patient is acutely suicidal, will discontinue suicide sitter, and consider telesitter at this time. ?-Patient does appear to have maladaptive coping skills, and appears to be focused primarily on pain management at this time. ?-Patient has declined psychiatric evaluation x 2 days, will attempt one more time to evaluate.  ?-Patient does not appear to have capacity to refuse medical treatment at this time. Will initiate IVC, as she continues to present as a danger to herself. ?-Recommend prn agitation protocol as patient is observed to be loud, agitated, belligerent, and labile. She has been disruptive, although no physical aggression or combativeness has been observed.  ?

## 2021-09-19 ENCOUNTER — Inpatient Hospital Stay (HOSPITAL_COMMUNITY): Payer: Medicaid Other

## 2021-09-19 DIAGNOSIS — J181 Lobar pneumonia, unspecified organism: Secondary | ICD-10-CM

## 2021-09-19 DIAGNOSIS — J9601 Acute respiratory failure with hypoxia: Secondary | ICD-10-CM | POA: Diagnosis not present

## 2021-09-19 DIAGNOSIS — K750 Abscess of liver: Secondary | ICD-10-CM | POA: Diagnosis not present

## 2021-09-19 DIAGNOSIS — A4901 Methicillin susceptible Staphylococcus aureus infection, unspecified site: Secondary | ICD-10-CM | POA: Diagnosis not present

## 2021-09-19 DIAGNOSIS — Z046 Encounter for general psychiatric examination, requested by authority: Secondary | ICD-10-CM

## 2021-09-19 LAB — PHOSPHORUS: Phosphorus: 2.2 mg/dL — ABNORMAL LOW (ref 2.5–4.6)

## 2021-09-19 LAB — BLOOD GAS, ARTERIAL
Acid-Base Excess: 2.6 mmol/L — ABNORMAL HIGH (ref 0.0–2.0)
Bicarbonate: 29.7 mmol/L — ABNORMAL HIGH (ref 20.0–28.0)
O2 Saturation: 99 %
Patient temperature: 39.2
pCO2 arterial: 61 mmHg — ABNORMAL HIGH (ref 32–48)
pH, Arterial: 7.31 — ABNORMAL LOW (ref 7.35–7.45)
pO2, Arterial: 107 mmHg (ref 83–108)

## 2021-09-19 LAB — GLUCOSE, CAPILLARY
Glucose-Capillary: 65 mg/dL — ABNORMAL LOW (ref 70–99)
Glucose-Capillary: 92 mg/dL (ref 70–99)

## 2021-09-19 LAB — AMMONIA: Ammonia: 35 umol/L (ref 9–35)

## 2021-09-19 LAB — MAGNESIUM: Magnesium: 1.7 mg/dL (ref 1.7–2.4)

## 2021-09-19 LAB — POTASSIUM: Potassium: 3.3 mmol/L — ABNORMAL LOW (ref 3.5–5.1)

## 2021-09-19 MED ORDER — DEXMEDETOMIDINE HCL IN NACL 200 MCG/50ML IV SOLN
0.4000 ug/kg/h | INTRAVENOUS | Status: DC
  Administered 2021-09-20: 1.1 ug/kg/h via INTRAVENOUS
  Filled 2021-09-19: qty 50

## 2021-09-19 MED ORDER — DEXMEDETOMIDINE HCL IN NACL 200 MCG/50ML IV SOLN
INTRAVENOUS | Status: AC
  Administered 2021-09-19: 0.8 ug/kg/h via INTRAVENOUS
  Filled 2021-09-19: qty 50

## 2021-09-19 MED ORDER — PANTOPRAZOLE SODIUM 40 MG IV SOLR
40.0000 mg | Freq: Two times a day (BID) | INTRAVENOUS | Status: DC
  Administered 2021-09-19 – 2021-09-24 (×11): 40 mg via INTRAVENOUS
  Filled 2021-09-19 (×12): qty 10

## 2021-09-19 MED ORDER — LORAZEPAM 2 MG/ML IJ SOLN: 1.0000 mg | INTRAMUSCULAR | Status: DC | PRN

## 2021-09-19 MED ORDER — LORAZEPAM 2 MG/ML IJ SOLN: 2.0000 mg | Freq: Four times a day (QID) | INTRAMUSCULAR | Status: DC | PRN

## 2021-09-19 MED ORDER — POTASSIUM CHLORIDE 20 MEQ PO PACK
40.0000 meq | PACK | ORAL | Status: AC
  Administered 2021-09-19 (×2): 40 meq via ORAL
  Filled 2021-09-19 (×2): qty 2

## 2021-09-19 MED ORDER — LORAZEPAM 2 MG/ML IJ SOLN: 2.0000 mg | Freq: Once | INTRAMUSCULAR | Status: DC

## 2021-09-19 MED ORDER — AYR SALINE NASAL NA GEL
1.0000 "application " | NASAL | Status: DC | PRN
  Administered 2021-09-19: 1 via NASAL
  Filled 2021-09-19: qty 14.1

## 2021-09-19 MED ORDER — LEVALBUTEROL HCL 0.63 MG/3ML IN NEBU
0.6300 mg | INHALATION_SOLUTION | Freq: Four times a day (QID) | RESPIRATORY_TRACT | Status: DC
  Administered 2021-09-20 – 2021-09-28 (×36): 0.63 mg via RESPIRATORY_TRACT
  Filled 2021-09-19 (×36): qty 3

## 2021-09-19 MED ORDER — LEVALBUTEROL HCL 0.63 MG/3ML IN NEBU: 0.6300 mg | INHALATION_SOLUTION | Freq: Three times a day (TID) | RESPIRATORY_TRACT | Status: DC | PRN

## 2021-09-19 MED ORDER — LORAZEPAM 2 MG/ML IJ SOLN
INTRAMUSCULAR | Status: AC
  Administered 2021-09-19: 1 mg via INTRAVENOUS
  Filled 2021-09-19: qty 1

## 2021-09-19 MED ORDER — DEXTROSE 50 % IV SOLN
12.5000 g | Freq: Once | INTRAVENOUS | Status: AC
  Administered 2021-09-19: 12.5 g via INTRAVENOUS
  Filled 2021-09-19: qty 50

## 2021-09-19 MED ORDER — MORPHINE SULFATE (PF) 2 MG/ML IV SOLN: 1.0000 mg | INTRAVENOUS | Status: DC | PRN

## 2021-09-19 MED ORDER — DEXTROSE-NACL 5-0.45 % IV SOLN: INTRAVENOUS | Status: DC

## 2021-09-19 MED ORDER — MAGNESIUM SULFATE 2 GM/50ML IV SOLN
2.0000 g | Freq: Once | INTRAVENOUS | Status: AC
  Administered 2021-09-19: 2 g via INTRAVENOUS
  Filled 2021-09-19: qty 50

## 2021-09-19 MED ORDER — POTASSIUM PHOSPHATES 15 MMOLE/5ML IV SOLN
15.0000 mmol | Freq: Once | INTRAVENOUS | Status: AC
  Administered 2021-09-19: 15 mmol via INTRAVENOUS
  Filled 2021-09-19: qty 5

## 2021-09-19 MED ORDER — MORPHINE SULFATE (PF) 2 MG/ML IV SOLN
2.0000 mg | INTRAVENOUS | Status: DC | PRN
  Administered 2021-09-19 (×2): 2 mg via INTRAVENOUS
  Filled 2021-09-19 (×2): qty 1

## 2021-09-19 MED ORDER — HALOPERIDOL LACTATE 5 MG/ML IJ SOLN
5.0000 mg | Freq: Two times a day (BID) | INTRAMUSCULAR | Status: DC | PRN
  Administered 2021-09-23: 5 mg via INTRAVENOUS
  Filled 2021-09-19 (×2): qty 1

## 2021-09-19 MED ORDER — FUROSEMIDE 10 MG/ML IJ SOLN
20.0000 mg | Freq: Once | INTRAMUSCULAR | Status: AC
  Administered 2021-09-19: 20 mg via INTRAVENOUS
  Filled 2021-09-19: qty 2

## 2021-09-19 NOTE — Assessment & Plan Note (Signed)
--  abx as above ?

## 2021-09-19 NOTE — Progress Notes (Addendum)
AM spot check CBG was 65. Standing hypoglycemia protocol followed, and pt was given 12.5 g dextrose IVP. Repeat CBG was 92. Dr Sarajane Jews notified by this RN, and orders received for continuous D5 1/2 NS.  ? ?Dr Sarajane Jews also notified that 1000 antihypertensive meds were held due to ongoing soft BP.This RN will continue to carefully monitor pt.  ?

## 2021-09-19 NOTE — Progress Notes (Signed)
eLink Physician-Brief Progress Note ?Patient Name: Terri [name] ?DOB: Jul 25, 1975 ?MRN: [id number] ? ? ?Date of Service ? 09/19/2021  ?HPI/Events of Note ? 45 yr old debilitated female with history of polysubstance abuse who has been in hospital since 4/10 for GI bleed which is resolved, MSSA liver abscesses, osteomyelitis of left foot, portal vein thrombosis now with multifocal pneumonia and effusions on top of suspected COPD leading to hypoxia and hypercapnea.  Patient now in ICU and requiring Bipap.  ?eICU Interventions ? Bipap ?Repeat abg in 2 hrs ?Continue abx as directed by ID ?Scheduled short acting bronchodilator therapy every 6 hrs ?At risk of needing intubation ?May need thoracentesis in days to come to help with resp status  ? ? ? ?Intervention Category ?Evaluation Type: New Patient Evaluation ? [name], [name], P ?09/19/2021, 9:59 PM ?

## 2021-09-19 NOTE — Progress Notes (Signed)
OT Cancellation Note ? ?Patient Details ?Name: Terri [name] ?MRN: [id number] ?DOB: 1975/10/22 ? ? ?Cancelled Treatment:    Reason Eval/Treat Not Completed: Other (comment) ?Patient declined to participate in session reporting that she did not feel up to it at this time with feet still hurting and awaiting nurse to get orders for constipation. OT to continue to follow and check back as schedule will allow.  ?Khaniya Tenaglia OTR/L, MS ?Acute Rehabilitation Department ?Office# [phone] ?Pager# [phone] ? ? ?09/19/2021, 2:02 PM ?

## 2021-09-19 NOTE — Progress Notes (Addendum)
After ordered disimpaction, pt became increasingly more SOB/rhonchus with accessory muscle use. Pt refusing PRN breathing treatments at this time, stating "They don't help". Dr Sarajane Jews notified by this RN, and orders received for 20 mg IV lasix and NPO status. Pt is now on 10L [place]. This RN will continue to carefully monitor pt for changes in respiratory status.  ?

## 2021-09-19 NOTE — Progress Notes (Signed)
OT Cancellation Note ? ?Patient Details ?Name: Terri [name] ?MRN: [id number] ?DOB: 1976-04-01 ? ? ?Cancelled Treatment:    Reason Eval/Treat Not Completed: Patient at procedure or test/ unavailable ?Nurse completing treatment on patient at this time. OT to continue to follow and check [name] on 09/20/21. ?Heavyn Yearsley OTR/L, MS ?Acute Rehabilitation Department ?Office# [phone] ?Pager# [phone] ? ?09/19/2021, 3:04 PM ?

## 2021-09-19 NOTE — Progress Notes (Addendum)
?  [facility] for Infectious Disease   ? ?Date of Admission:  08/31/2021    ? ? ?ID: Terri [name] is a 46 y.o. female with  disseminated MSSA infection, and worsening pneumonia with hypoxia and encephalopathy ?Active Problems: ?  Hypokalemia ?  Polysubstance abuse ([place]) ?  Acute on chronic blood loss anemia ?  Multilobar pneumonia with acute hypoxic respiratory failure ([place]) ?  Portal vein thrombosis ?  Candida infection, esophageal ([place]) ?  Hepatic abscess ?  Swelling of left hand ?  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  Ascites ?  Exudative pleural effusion ?  Hypophosphatemia ?  Vaginal discharge ?  Acute respiratory failure with hypoxia ([place]) ?  Involuntary commitment ? ? ? ?Subjective: ?Afebrile, but having worsening tachypnea, and AMS -transferred to ICU. She still remains on 6L  [place] but intermittently hypoxic. She is getting iv lasix to see if any improvement. She had chest CT showing dense pneumonia predominantly right sided. She had to be disimpacted in ICU. ? ?Medications:  ? amLODipine  10 mg Oral Daily  ? Chlorhexidine Gluconate Cloth  6 each Topical Q0600  ? feeding supplement  237 mL Oral BID BM  ? fluconazole  200 mg Oral Daily  ? folic acid  1 mg Oral Daily  ? furosemide  20 mg Intravenous Once  ? guaiFENesin  600 mg Oral BID  ? hydrALAZINE  50 mg Oral BID  ? lidocaine  1 patch Transdermal Daily  ? lisinopril  10 mg Oral QHS  ? magnesium oxide  400 mg Oral BID  ? mouth rinse  15 mL Mouth Rinse BID  ? mirtazapine  7.5 mg Oral QHS  ? multivitamin with minerals  1 tablet Oral Daily  ? mupirocin ointment  1 application. Nasal BID  ? nicotine  21 mg Transdermal Daily  ? nutrition supplement (JUVEN)  1 packet Oral BID WC  ? pantoprazole  40 mg Oral BID  ? potassium chloride  40 mEq Oral Q4H  ? sucralfate  1 g Oral TID WC & HS  ? vitamin B-12  1,000 mcg Oral Daily  ? ? ?Objective: ?Vital signs in last 24 hours: ?Temp:  [97.2 ?F (36.2 ?C)-98.5 ?F  (36.9 ?C)] 98 ?F (36.7 ?C) (04/29 1522) ?Pulse Rate:  [84-107] 100 (04/29 1400) ?Resp:  [14-34] 25 (04/29 1400) ?BP: (86-113)/(54-80) 111/68 (04/29 1400) ?SpO2:  [88 %-100 %] 92 % (04/29 1400) ?Weight:  [66.7 kg] 66.7 kg (04/28 1742) ?Physical Exam  ?Constitutional:  oriented to person, tachypnic with acc m use. appears cachetic and chronically malnourished. No distress.  ?HENT: [place]/AT, PERRLA, no scleral icterus ?Mouth/Throat: Oropharynx is clear and moist. No oropharyngeal exudate.  ?Cardiovascular: tachy, regular rhythm and normal heart sounds. Exam reveals no gallop and no friction rub.  ?No murmur heard.  ?Pulmonary/Chest:tachypnic, acc m use. Mild exp wheeze ?Neck = supple, no nuchal rigidity ?Abdominal: Soft. Bowel sounds are normal.  exhibits no distension. There is no tenderness.  ?Lymphadenopathy: no cervical adenopathy. No axillary adenopathy ?QZR:AQTM toe wrapped ?Neurological: alert and oriented to person, place, and time. Limited exam due to weakness ?Skin: Skin is warm and dry. No rash noted. No erythema.  ?Psychiatric: upset that she is wearing oxygen ? ? ?Lab Results ?Recent Labs  ?  09/18/21 ?2263 09/19/21 ?0239  ?WBC 5.0  --   ?HGB 9.0*  --   ?HCT 28.0*  --   ?NA 131*  --   ?  K 3.2* 3.3*  ?CL 97*  --   ?CO2 30  --   ?BUN 13  --   ?CREATININE 0.53  --   ? ?Liver Panel ?Recent Labs  ?  09/18/21 ?0846  ?PROT 4.6*  ?ALBUMIN <1.5*  ?AST 29  ?ALT <5  ?ALKPHOS 138*  ?BILITOT 0.3  ? ?Sedimentation Rate ?No results for input(s): ESRSEDRATE in the last 72 hours. ?C-Reactive Protein ?No results for input(s): CRP in the last 72 hours. ? ?Microbiology: ?4/27 cx toe amputation -  ?Methicillin resistant staphylococcus aureus     ? MIC   ? CIPROFLOXACIN >=8 RESISTANT  Resistant   ? CLINDAMYCIN <=0.25 SENS... Sensitive   ? ERYTHROMYCIN >=8 RESISTANT  Resistant   ? GENTAMICIN <=0.5 SENSI... Sensitive   ? Inducible Clindamycin NEGATIVE  Sensitive   ? OXACILLIN >=4 RESISTANT  Resistant   ? RIFAMPIN <=0.5 SENSI...  Sensitive   ? TETRACYCLINE <=1 SENSITIVE  Sensitive   ? TRIMETH/SULFA >=320 RESIS... Resistant   ? VANCOMYCIN 1 SENSITIVE  Sensitive   ? ?Studies/Results: ?CT CHEST W CONTRAST ? ?Result Date: 09/18/2021 ?CLINICAL DATA:  Pneumonia. EXAM: CT CHEST WITH CONTRAST TECHNIQUE: Multidetector CT imaging of the chest was performed during intravenous contrast administration. RADIATION DOSE REDUCTION: This exam was performed according to the departmental dose-optimization program which includes automated exposure control, adjustment of the mA and/or kV according to patient size and/or use of iterative reconstruction technique. CONTRAST:  49m OMNIPAQUE IOHEXOL 300 MG/ML  SOLN COMPARISON:  June 23, 2021 FINDINGS: Cardiovascular: No significant vascular findings. Normal heart size. No pericardial effusion. Mediastinum/Nodes: No enlarged mediastinal, hilar, or axillary lymph nodes. Thyroid gland, trachea, and esophagus demonstrate no significant findings. Lungs/Pleura: Very large areas of consolidation are seen throughout the right upper lobe, right middle lobe and right lower lobe. Marked severity multifocal bilateral infiltrates are also seen. This represents a new finding when compared to the prior study. There is a small left pleural effusion. A moderate sized right pleural effusion is also noted. No pneumothorax is identified. Upper Abdomen: Multiple surgical clips are seen within the gallbladder fossa. There is mild central intrahepatic biliary dilatation. Multiple ill-defined low-attenuation liver lesions are seen within the right lobe of the liver. The largest measures approximately 3.0 cm x 2.6 cm (axial CT image 162, CT series 2). These are not visualized on the prior study. Surgical sutures are seen along the gastric region. Marked severity ascites is seen within the visualized portion of the abdomen. Musculoskeletal: There is marked severity anasarca throughout the chest wall. No acute or significant osseous  findings. IMPRESSION: 1. Very large areas of consolidation throughout the right upper lobe, right middle lobe and right lower lobe, with marked severity multifocal bilateral infiltrates. While this is likely, in part, infectious in etiology, an underlying neoplastic process cannot be excluded. 2. Bilateral pleural effusions, right greater than left. 3. Multiple ill-defined low-attenuation liver lesions within the right lobe of the liver which are not visualized on the prior study. These are concerning for metastatic disease. Correlation with MRI is recommended. 4. Marked severity anasarca. 5. Ascites. Electronically Signed   By: TVirgina NorfolkM.D.   On: 09/18/2021 22:28  ? ?DG CHEST PORT 1 VIEW ? ?Result Date: 09/18/2021 ?CLINICAL DATA:  Tachypnea EXAM: PORTABLE CHEST 1 VIEW COMPARISON:  Radiograph 09/15/2021 FINDINGS: There is increased density of confluent airspace disease in the right upper lung and increasing airspace opacities in the right lower lung. Small layering right pleural effusion. No visible pneumothorax. IMPRESSION: Increased density  of confluent airspace disease in the right upper lung and increasing opacity in the right lower lung other due to airspace disease or increased layering right pleural effusion. Electronically Signed   By: Maurine Simmering M.D.   On: 09/18/2021 08:40  ? ?DG Abd Portable 1V ? ?Result Date: 09/18/2021 ?CLINICAL DATA:  Abdominal pain EXAM: PORTABLE ABDOMEN - 1 VIEW COMPARISON:  None. FINDINGS: No evidence of bowel obstruction. Right upper quadrant surgical clips. Moderate stool burden. Subacute-chronic left anterior ninth rib injury. Degenerative disc disease of the lumbar spine worst at L3-L4 and L4-L5. IMPRESSION: No evidence of bowel obstruction. Electronically Signed   By: Maurine Simmering M.D.   On: 09/18/2021 08:45   ? ? ?Assessment/Plan: constellation of symptoms consistent with disseminated MSSA infection - with  ?Hepatic abscess, c/b portal vein thrombus = cx grew MSSA and  bacteroides, thus on amp/sub,  ? ?Eso candidiasis = on fluconazole ? ?Left great toe osteomyelitis s/p amputation = tissue cx + MRSA which would explain how her pneumonia worsening despite amp/sub ? ?Multifocal pne

## 2021-09-19 NOTE — Consult Note (Signed)
? ?NAME:  Terri [name], MRN:  [id number], DOB:  1975-08-12, LOS: 11 ?ADMISSION DATE:  08/31/2021, CONSULTATION DATE:  09/19/21 ?REFERRING MD:  E link doctor, CHIEF COMPLAINT:  dark colored stool   ? ?History of Present Illness:  ?46 yo female with pmh homelessness, recurrent gib, chronic microcytic anemia, htn, polysubstance abuse, roux en y presented to [facility] with reports of dark colored stools. Pt has history of chronic gib 2/2 peptic ulcers. She underwent endoscopy with large anastomotic ulceration noted and started on ppi. Hgb stabliized.  ? ?She was then found to have mssa bacteremia that appears to be disseminated. Her L great toe was noted to be gangrenous vascular surgery and orthopedics determined amputation would be best route for pt and this was done on 4/26. Cx from this was MRSA and she was broadened to vancomycin. She has multiple liver abscess noted and a history of portal v thrombosis that is reportedly stable. She has not had any a/c 2/2 GIB's.  ? ?She is being treated for 6 weeks of iv abx. Over the past two - three days pt has req increasing oxygen support ultimately req transfer to ICU. She was placed on bipap overnight and ccm was then consulted for evaluation for possible intubation.  ? ?All history is obtained from chart and RN as pt is not oriented for any discussion.  ? ?Pertinent  Medical History  ?Portal v thrombosis, polysubstance abuse, chronic anemia,  ? ?Significant Hospital Events: ?Including procedures, antibiotic start and stop dates in addition to other pertinent events   ?4/10 admitted ?4/13 egd for gib ?4/15 vascular surgery consulted for gangrenous L toe ?4/18 ID consulted for mssa infection, liver abscess, osteo, mrsa pneumonia ?4/26: ortho consulted for amputation of L great toe ?4/27 psych consulted for capacity, pt was placed on ivc ?4/28: transferred to ICU for increasing oxygen requirement ?4/29: ccm consulted  ? ?Interim History / Subjective:  ?4/29: ccm consulted for  worsening resp status after transfer to ICU for same ? ?Objective   ?Blood pressure (!) 143/80, pulse (!) 111, temperature 98 ?F (36.7 ?C), temperature source Oral, resp. rate (!) 23, height '5\' 9"'$  (1.753 m), weight 66.7 kg, SpO2 100 %. ?   ?Vent Mode: BIPAP;PCV ?FiO2 (%):  [90 %] 90 % ?Set Rate:  [20 bmp] 20 bmp ?PEEP:  [5 cmH20] 5 cmH20 ?Plateau Pressure:  [18 cmH20] 18 cmH20  ? ?Intake/Output Summary (Last 24 hours) at 09/19/2021 2304 ?Last data filed at 09/19/2021 1849 ?Gross per 24 hour  ?Intake 3259.28 ml  ?Output 1200 ml  ?Net 2059.28 ml  ? ?Filed Weights  ? 09/17/21 0500 09/18/21 0342 09/18/21 1742  ?Weight: 63.9 kg 63 kg 66.7 kg  ? ? ?Examination: ?General: cachectic female yelling profanity at staff despite NIV in place, increased wob with accessory muscle use, restless in bed ?HENT: ncat, eomi, unable to examine mucous membranes 2/2 NIV ?Lungs: coarse rhonchi throughout ?Cardiovascular: tachycardic but regular ?Abdomen: soft, ttp but no rebound or guarding, bs + ?Extremities: with L great toe amputation and bandage in place c/d/i ?Neuro: awake and fighting with staff, profanity and swinging mitted hands. No focal deficits appreciated ?GU: deferred ? ?Resolved Hospital Problem list   ? ? ?Assessment & Plan:  ?Acute hypoxic hypercarbic resp failure:  ?2/2 MRSA pneumonia:  ?Exudative pleural effusion ?-transferred to ICU for increased wob and somnolence.  ?-this evening worsened req NIV ?-starting precedex, titrate to rass 0 to -1 ?-if doesn't improve and tolerate NIV better will move to  intubate ?-abx per ID ?-pt is not on chemoprophylaxis since admission per GI 2/2 large ulceration and gib (recurrent) ?-could consider pe as contributing factor but at this time cannot go for CT and certainly would not follow instructions at this time. Definitely has a worsening pneumonia ?-will order le venous dopplers  ?-if le doppler positive  would need ivc filter as pt is not candidate for full dose a/c per  GI ? 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 -abx per ID ? ?Chronic microcytic anemia:  ?GIB:  ?Portal v thrombosis ?-no acute indication for transfusion ?-cont ppi ?-per GI not a candidate for full dose a/c  ? ?Hypokalemia ?Hyponatremia ?Hypoglycemia ?-monitor and replace as able ? ?Homelessness:  ?Polysubstance abuse ?-case management aware ?-psych also following for IVC ? ?Best Practice (right click and "Reselect all SmartList Selections" daily)  ? ?Diet/type: NPO ?DVT prophylaxis: other ortho recommended aspirin ?GI prophylaxis: PPI ?Lines: N/A ?Foley:  N/A ?Code Status:  full code ?Last date of multidisciplinary goals of care discussion [unable to reach family at this time. Pt is not oriented and at some point was ivc'd 2/2 lack of capacity] ? ?Labs   ?CBC: ?Recent Labs  ?Lab 09/13/21 ?0321 09/14/21 ?5188 09/15/21 ?1641 09/18/21 ?0846  ?WBC 11.6* 7.4 5.0 5.0  ?NEUTROABS 10.1* 5.6 3.9 3.8  ?HGB 10.4* 7.8* 8.0* 9.0*  ?HCT 31.2* 23.8* 24.9* 28.0*  ?MCV 75.5* 78.3* 77.3* 77.6*  ?PLT 130* 166 124* 179  ? ? ?Basic Metabolic Panel: ?Recent Labs  ?Lab 09/13/21 ?0321 09/14/21 ?4166 09/15/21 ?1641 09/18/21 ?0630 09/19/21 ?0239  ?NA 132* 133* 133* 131*  --   ?K 3.6 3.8 3.5 3.2* 3.3*  ?CL 104 103 103 97*  --   ?CO2 '23 25 27 30  '$ --   ?GLUCOSE 84 83 84 57*  --   ?BUN '11 13 11 13  '$ --   ?CREATININE 0.39* 0.30* 0.45 0.53  --   ?CALCIUM 7.1* 7.0* 7.0* 7.0*  --   ?MG 1.6* 2.1  --   --  1.7  ?PHOS 2.7 2.4*  --   --  2.2*  ? ?GFR: ?Estimated Creatinine Clearance: 90.9 mL/min (by C-G formula based on SCr of 0.53 mg/dL). ?Recent Labs  ?Lab 09/13/21 ?0321 09/14/21 ?1601 09/15/21 ?1641 09/18/21 ?0932 09/18/21 ?1226  ?WBC 11.6* 7.4 5.0 5.0  --   ?LATICACIDVEN  --   --   --  0.8 1.5  ? ? ?Liver Function Tests: ?Recent Labs  ?Lab 09/13/21 ?0321 09/14/21 ?3557 09/15/21 ?1641 09/18/21 ?0846  ?AST '18 21 17 29  '$ ?ALT 9 <5 7 <5  ?ALKPHOS 180* 169* 153* 138*  ?BILITOT 0.4 0.6 0.3 0.3  ?PROT 4.4* 4.3*  4.2* 4.6*  ?ALBUMIN <1.5* <1.5* <1.5* <1.5*  ? ?No results for input(s): LIPASE, AMYLASE in the last 168 hours. ?Recent Labs  ?Lab 09/19/21 ?2213  ?AMMONIA 35  ? ? ?ABG ?   ?Component Value Date/Time  ? PHART 7.31 (L) 09/19/2021 2032  ? PCO2ART 61 (H) 09/19/2021 2032  ? PO2ART 107 09/19/2021 2032  ? HCO3 29.7 (H) 09/19/2021 2032  ? ACIDBASEDEF 2.1 (H) 04/13/2021 0224  ? O2SAT 99 09/19/2021 2032  ?  ? ?Coagulation Profile: ?Recent Labs  ?Lab 09/18/21 ?0846  ?INR 1.2  ? ? ?Cardiac Enzymes: ?No results for input(s): CKTOTAL, CKMB, CKMBINDEX, TROPONINI in the last 168 hours. ? ?HbA1C: ?Hgb A1c MFr Bld  ?Date/Time Value Ref Range Status  ?09/12/2021 03:29 AM 5.2 4.8 -  5.6 % Final  ?  Comment:  ?  (NOTE) ?Pre diabetes:          5.7%-6.4% ? ?Diabetes:              >6.4% ? ?Glycemic control for   <7.0% ?adults with diabetes ?  ?02/03/2019 05:15 AM 5.2 4.8 - 5.6 % Final  ?  Comment:  ?  (NOTE) ?        Prediabetes: 5.7 - 6.4 ?        Diabetes: >6.4 ?        Glycemic control for adults with diabetes: <7.0 ?  ? ? ?CBG: ?Recent Labs  ?Lab 09/18/21 ?1022 09/18/21 ?1209 09/18/21 ?1405 09/19/21 ?0732 09/19/21 ?0820  ?GLUCAP 228* 173* 103* 65* 92  ? ? ?Review of Systems:   ?Poor historian and disoriented. Unreliable ros moaning at every question and profanity to staff about mitts and bipap ? ?Past Medical History:  ?She,  has a past medical history of Arthritis, Chronic back pain, DVT (deep venous thrombosis) ([place]), GIB (gastrointestinal bleeding) (09/13/2017), History of blood transfusion, Hypertension, Migraine headache, Mood disorder ([place]), Neuropathy, Pneumonia, Positive PPD, Sciatica, and Stress incontinence.  ? ?Surgical History:  ? ?Past Surgical History:  ?Procedure Laterality Date  ? ALVEOLOPLASTY Bilateral 01/31/2018  ? Procedure: ALVEOLOPLASTY;  Surgeon: Diona Browner, DDS;  Location: [facility];  Service: Oral Surgery;  Laterality: Bilateral;  ? AMPUTATION Left 09/16/2021  ? Procedure: AMPUTATION OF LEFT GREAT TOE;  Surgeon:  Armond Hang, MD;  Location: [facility];  Service: Orthopedics;  Laterality: Left;  ? ANKLE SURGERY Left 1992  ? with hardware  ? BACK SURGERY    ? BIOPSY  02/08/2019  ? Procedure: BIOPSY;  Surgeon: Irving Copas

## 2021-09-19 NOTE — Progress Notes (Signed)
Pt placed on bipap servo per order and she is tolerating it ok at this time. RT will continue to monitor.  ?

## 2021-09-19 NOTE — Progress Notes (Signed)
SLP Cancellation Note ? ?Patient Details ?Name: Terri [name] ?MRN: [id number] ?DOB: [birth year] ? ? ?Cancelled treatment:       Reason Eval/Treat Not Completed: Pain limiting ability to participate. Pt complains of severe discomfort due to constipation, unwilling to sit up for clinical swallow eval at this time. NT reports that whenever she is given something to drink they sit her up and leave her upright for 30 minutes. She also reports that she does not see the pt doing any immediate coughing when drinking, but RN does feel that pt may be aspirating given her worsening congestion. There has been little benefit to clinical bedside evaluations at this point and staff is already following precautions. Given the pts history of esophageal dysphagia, an instrumental assessment is warranted to evaluate pt completely. Recommend SLP f/u on Monday for pts willingness to participate in [place].  ? ? ?Tawn Fitzner, Katherene Ponto ?09/19/2021, 2:03 PM ?

## 2021-09-19 NOTE — Progress Notes (Signed)
ABG collected and send down to lab for analysis. Called lab.  ?

## 2021-09-19 NOTE — Progress Notes (Addendum)
?Progress Note ? ? ?Patient: Terri [name] JIR:[id number] DOB: [birth year] DOA: 08/31/2021     18 ?DOS: the patient was seen and examined on 09/19/2021 ?  ?Brief hospital course: ?46 year old woman admitted for acute GIB. EGD showed esophageal plaques consistent with candidiasis, large cratered nonbleeding duodenal ulcer with no stigmata of bleeding.  CT angiogram showed multiple low-attenuation lesions throughout the liver seen on recent prior exam, highly concerning for hepatic abscesses or metastasis.  S/p IR US guided bx and aspiration, revealing MSSA abscess. 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  4/28 with worsening hypoxia, CT w/ severe right-sided multilobar pneumonia, transferred to SDU. Seen by psychiatry, IVC placed for lack of capacity. ? ?Assessment and Plan: ?* Acute upper GI bleed-resolved as of 09/17/2021 ?--resolved. EGD 4/13 with esophageal plaques c/w candidiasis, large cratered non bleeding jejunal ulcer with non stigmata of bleeding.  Nodular regions more like regenerative tissue and not like visible vessels. ?PPI BID x2 months, sucralfate QID x2 weeks ? ?Disseminated MSSA with liver abscess, left foot osteo and abscess, 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 ?--liver abnormalities on imaging, s/o biopsy compatible with abscess, aspiration 4/17 with abundant staph aureus (MSSA), abundant bacteroides oralis ?--left foot osteo and abscess -- s/p amputation and I&D 4/27 ?--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 given left great toe gangrene and wrist swelling, canceled TEE x2 secondary to respiratory decompensation. MRI lumbar and thoracic spine without discitis/osteomyelitis in thoracolumbar spine ?--repeat CT with contrast or MRI of liver with and without contrast in 4 weeks to confirm that liver lesions were abscess and not metastasis ? ?Hepatic abscess ?--abx as above ? ?Multilobar pneumonia with acute hypoxic respiratory failure (HCC) ?-- CT  showed very large areas of consolidation throughout the right upper lobe, right middle lobe and right lower lobe, with marked severity multifocal bilateral infiltrates.  ?-- Continues to require high flow nasal cannula but oxygen requirement somewhat decreased.  Remains tachypneic and dyspneic with supraclavicular retractions.  Continue empiric antibiotics.  Appreciate ID.  Cannot rule out aspiration, although may represent disseminated MSSA complication.  Speech therapy evaluation pending. ? ?Osteomyelitis of great toe of left foot (HCC)-resolved as of 09/19/2021 ?--follow intraoperative cultures -- abundant Staph aureus ?--maximum elevation of operative extremity to minimize swelling ?--OK to ambulate with heel weightbearing in CAM boot or postop shoe ?--bedside nursing to do daily dry dressing changes- ?-VTE prophylaxis (Ortho recs Aspirin 81 mg BID x 6 wk or per primary team preference) ?--follow up with Ortho within 1 week from discharge from hospital for wound check [phone] for appt) ?--sutures out in 3-4 weeks in outpatient office ? ?Gangrene of toe of left foot (HCC)-resolved as of 09/19/2021 ?--Seen by Dr. Scot Dock on 4/15, noted dry gangrene on tip of L great toe, palpable pedal pulses, thought to have adequate circulation to heal this (unclear etiology per vascular).   ?--now s/p amputation by orthopedics ? ?Acute on chronic blood loss anemia ?--secondary to GIB; stable ? ? ?Swelling of left hand ?--MRI limited, no fluid collection or tenosynovitis seen ? ?Involuntary commitment ?--as per psychiatry ? ?Exudative pleural effusion ?--s/p thoracentesis 4/23 with 1.2 L of turbid light yellow fluid; post thora CXR with severe multilobar bilateral pneumonia, most pronounced in R mid lung, needs f/u PA/lateral in 3-4 weeks following abx therapy to ensure resolution ? ?Portal vein thrombosis ?--Large burden of thrombus in central portal vein as well as R portal vein and branches ?--as per GI, not a candidate  for anticoagulation  ? ?Vaginal discharge ?--currently on metronidazole for presumed BV ? ?Ascites ?--2 L removed during liver bx, but analysis not performed. Repeat US didn't show enough fluid for repeat paracentesis ? ?Hypophosphatemia ?--replete ? ?Polysubstance abuse ([place]) ?--Encourage cessation. UDS with cocaine ? ? ?Hypokalemia ?--replete as needed ? ?Essential hypertension ?Continue lisinopril, amlodipine, hydral ?Labetalol prn ? ?Candida infection, esophageal ([place]) ?-- continue fluconazole ?-- check HIV nonreactive ?-- negative acute hepatitis panel  ? ?Suicidal ideation-resolved as of 09/17/2021 ?--resolved, discontinue sitter per psychiatry ? ?Hypoglycemia, secondary to minimal oral intake ?--change to D5 1/2 NS ?--follow ? ?  ? ?Subjective:  ?Transferred to SDU yesterday for dyspnea, tachypnea, hypoxia ?No events charted overnight ?Discussed w/ RN -- no issues overnight, but hypoglycemic this AM ?Pt reports feels ok, slept ok, breathing fine ?Very hungry, wants food ?Denies dysphagia or choking episodes ?Sitter at bedside ? ?Physical Exam: ?Vitals:  ? 09/19/21 0529 09/19/21 0559 09/19/21 0700 09/19/21 0845  ?BP: 108/68 (!) 105/58 96/65   ?Pulse: 88 84 88   ?Resp: (!) 22 16 (!) 23   ?Temp:  (!) 97.2 ?F (36.2 ?C)  97.6 ?F (36.4 ?C)  ?TempSrc:  Axillary  Axillary  ?SpO2: 94% 97% 100%   ?Weight:      ?Height:      ?BM x2 ?UOP 1150 ?Physical Exam ?Vitals and nursing note reviewed.  ?Constitutional:   ?   General: She is not in acute distress. ?   Appearance: She is ill-appearing. She is not toxic-appearing.  ?Cardiovascular:  ?   Rate and Rhythm: Normal rate and regular rhythm.  ?   Heart sounds: No murmur heard. ?   Comments: Telemetry SR ?Pulmonary:  ?   Effort: No respiratory distress.  ?   Breath sounds: No wheezing, rhonchi or rales.  ?   Comments: Moderate increased resp effort with suprasternal retractions ?Musculoskeletal:  ?   Right lower leg: No edema.  ?   Left lower leg: No edema.  ?Neurological:  ?    Mental Status: She is alert.  ?Psychiatric:     ?   Mood and Affect: Mood normal.     ?   Behavior: Behavior normal.  ? ? ?Data Reviewed: ? ?CBG 65, high of 228 last 24 hours > start D5 1/2 Advance ?Phos 2.2 > replete ?Mg 1.7 > replete ?CT chest > Very large areas of consolidation throughout the right upper ?lobe, right middle lobe and right lower lobe, with marked severity ?multifocal bilateral infiltrates. While this is likely, in part, ?infectious in etiology, an underlying neoplastic process cannot be ?excluded. ? ?Family Communication: none present ? ?Disposition: ?Status is: Inpatient ?Remains inpatient appropriate because: high oxygen requirement, respiratory distress, severe pneumonia ? Planned Discharge Destination: Home ? ? ? ?Time spent: 30 minutes ? ?Author: ?Murray Hodgkins, MD ?09/19/2021 8:50 AM ? ?For on call review [email or web address].  ?

## 2021-09-19 NOTE — Assessment & Plan Note (Signed)
--  as per psychiatry ?

## 2021-09-20 ENCOUNTER — Inpatient Hospital Stay (HOSPITAL_COMMUNITY): Payer: Medicaid Other

## 2021-09-20 ENCOUNTER — Inpatient Hospital Stay: Payer: Self-pay

## 2021-09-20 DIAGNOSIS — Z515 Encounter for palliative care: Secondary | ICD-10-CM

## 2021-09-20 DIAGNOSIS — Z7189 Other specified counseling: Secondary | ICD-10-CM

## 2021-09-20 DIAGNOSIS — R609 Edema, unspecified: Secondary | ICD-10-CM | POA: Diagnosis not present

## 2021-09-20 DIAGNOSIS — M7989 Other specified soft tissue disorders: Secondary | ICD-10-CM | POA: Diagnosis not present

## 2021-09-20 DIAGNOSIS — K922 Gastrointestinal hemorrhage, unspecified: Secondary | ICD-10-CM | POA: Diagnosis not present

## 2021-09-20 LAB — BLOOD GAS, ARTERIAL
Acid-Base Excess: 2.8 mmol/L — ABNORMAL HIGH (ref 0.0–2.0)
Acid-Base Excess: 4.2 mmol/L — ABNORMAL HIGH (ref 0.0–2.0)
Bicarbonate: 29.2 mmol/L — ABNORMAL HIGH (ref 20.0–28.0)
Bicarbonate: 29.4 mmol/L — ABNORMAL HIGH (ref 20.0–28.0)
Drawn by: 59133
FIO2: 70 %
MECHVT: 530 mL
O2 Saturation: 100 %
O2 Saturation: 96.8 %
PEEP: 5 cmH2O
Patient temperature: 36.4
Patient temperature: 39.2
RATE: 18 {breaths}/min
pCO2 arterial: 43 mmHg (ref 32–48)
pCO2 arterial: 57 mmHg — ABNORMAL HIGH (ref 32–48)
pH, Arterial: 7.33 — ABNORMAL LOW (ref 7.35–7.45)
pH, Arterial: 7.44 (ref 7.35–7.45)
pO2, Arterial: 169 mmHg — ABNORMAL HIGH (ref 83–108)
pO2, Arterial: 77 mmHg — ABNORMAL LOW (ref 83–108)

## 2021-09-20 LAB — BASIC METABOLIC PANEL
Anion gap: 5 (ref 5–15)
BUN: 12 mg/dL (ref 6–20)
CO2: 25 mmol/L (ref 22–32)
Calcium: 7.1 mg/dL — ABNORMAL LOW (ref 8.9–10.3)
Chloride: 102 mmol/L (ref 98–111)
Creatinine, Ser: 0.53 mg/dL (ref 0.44–1.00)
GFR, Estimated: >60 mL/min (ref >60)
Glucose, Bld: 61 mg/dL — ABNORMAL LOW (ref 70–99)
Potassium: 4.7 mmol/L (ref 3.5–5.1)
Sodium: 132 mmol/L — ABNORMAL LOW (ref 135–145)

## 2021-09-20 LAB — GLUCOSE, CAPILLARY
Glucose-Capillary: 186 mg/dL — ABNORMAL HIGH (ref 70–99)
Glucose-Capillary: 65 mg/dL — ABNORMAL LOW (ref 70–99)
Glucose-Capillary: 67 mg/dL — ABNORMAL LOW (ref 70–99)
Glucose-Capillary: 69 mg/dL — ABNORMAL LOW (ref 70–99)
Glucose-Capillary: 73 mg/dL (ref 70–99)
Glucose-Capillary: 76 mg/dL (ref 70–99)
Glucose-Capillary: 77 mg/dL (ref 70–99)
Glucose-Capillary: 86 mg/dL (ref 70–99)
Glucose-Capillary: 98 mg/dL (ref 70–99)

## 2021-09-20 LAB — BETA-HYDROXYBUTYRIC ACID: Beta-Hydroxybutyric Acid: 0.07 mmol/L (ref 0.05–0.27)

## 2021-09-20 LAB — MAGNESIUM
Magnesium: 2.1 mg/dL (ref 1.7–2.4)
Magnesium: 1.8 mg/dL (ref 1.7–2.4)

## 2021-09-20 LAB — PHOSPHORUS
Phosphorus: 4.1 mg/dL (ref 2.5–4.6)
Phosphorus: 3.8 mg/dL (ref 2.5–4.6)

## 2021-09-20 MED ORDER — SODIUM CHLORIDE 0.9 % IV SOLN: 250.0000 mL | INTRAVENOUS | Status: DC

## 2021-09-20 MED ORDER — ADULT MULTIVITAMIN LIQUID CH: 15.0000 mL | Freq: Every day | ORAL | Status: DC

## 2021-09-20 MED ORDER — NOREPINEPHRINE 4 MG/250ML-% IV SOLN
INTRAVENOUS | Status: AC
  Administered 2021-09-20: 10 ug/min via INTRAVENOUS
  Filled 2021-09-20: qty 250

## 2021-09-20 MED ORDER — FENTANYL CITRATE (PF) 100 MCG/2ML IJ SOLN
INTRAMUSCULAR | Status: AC
  Filled 2021-09-20: qty 2

## 2021-09-20 MED ORDER — SODIUM CHLORIDE 0.9% FLUSH: 10.0000 mL | INTRAVENOUS | Status: DC | PRN

## 2021-09-20 MED ORDER — ADULT MULTIVITAMIN LIQUID CH
15.0000 mL | Freq: Every day | ORAL | Status: DC
  Administered 2021-09-20 – 2021-09-25 (×6): 15 mL
  Filled 2021-09-20 (×8): qty 15

## 2021-09-20 MED ORDER — CHLORHEXIDINE GLUCONATE 0.12% ORAL RINSE (MEDLINE KIT)
15.0000 mL | Freq: Two times a day (BID) | OROMUCOSAL | Status: DC
  Administered 2021-09-20 – 2021-09-25 (×11): 15 mL via OROMUCOSAL

## 2021-09-20 MED ORDER — PROPOFOL 1000 MG/100ML IV EMUL
5.0000 ug/kg/min | INTRAVENOUS | Status: DC
  Administered 2021-09-20: 5 ug/kg/min via INTRAVENOUS
  Administered 2021-09-21 – 2021-09-22 (×2): 15 ug/kg/min via INTRAVENOUS
  Administered 2021-09-22: 5 ug/kg/min via INTRAVENOUS
  Administered 2021-09-23 – 2021-09-24 (×4): 15 ug/kg/min via INTRAVENOUS
  Administered 2021-09-25: 25 ug/kg/min via INTRAVENOUS
  Filled 2021-09-20 (×9): qty 100

## 2021-09-20 MED ORDER — PHENYLEPHRINE 80 MCG/ML (10ML) SYRINGE FOR IV PUSH (FOR BLOOD PRESSURE SUPPORT): 80.0000 ug | PREFILLED_SYRINGE | INTRAVENOUS | Status: DC | PRN

## 2021-09-20 MED ORDER — NOREPINEPHRINE 4 MG/250ML-% IV SOLN
2.0000 ug/min | INTRAVENOUS | Status: DC
  Administered 2021-09-20: 4 ug/min via INTRAVENOUS
  Filled 2021-09-20: qty 250

## 2021-09-20 MED ORDER — SODIUM CHLORIDE 0.9 % IV BOLUS
500.0000 mL | Freq: Once | INTRAVENOUS | Status: AC
  Administered 2021-09-20: 500 mL via INTRAVENOUS

## 2021-09-20 MED ORDER — ACETAMINOPHEN 160 MG/5ML PO SOLN
650.0000 mg | Freq: Four times a day (QID) | ORAL | Status: DC | PRN
  Administered 2021-09-20 – 2021-10-08 (×6): 650 mg
  Filled 2021-09-20 (×8): qty 20.3

## 2021-09-20 MED ORDER — PHENYLEPHRINE 80 MCG/ML (10ML) SYRINGE FOR IV PUSH (FOR BLOOD PRESSURE SUPPORT)
PREFILLED_SYRINGE | INTRAVENOUS | Status: AC
  Administered 2021-09-20: 200 ug via INTRAVENOUS
  Filled 2021-09-20: qty 10

## 2021-09-20 MED ORDER — MIDAZOLAM HCL (PF) 10 MG/2ML IJ SOLN: 2.0000 mg | Freq: Once | INTRAMUSCULAR | Status: DC

## 2021-09-20 MED ORDER — MIDAZOLAM HCL 2 MG/2ML IJ SOLN
INTRAMUSCULAR | Status: AC
  Filled 2021-09-20: qty 2

## 2021-09-20 MED ORDER — ORAL CARE MOUTH RINSE
15.0000 mL | OROMUCOSAL | Status: DC
  Administered 2021-09-20 – 2021-09-25 (×50): 15 mL via OROMUCOSAL

## 2021-09-20 MED ORDER — ETOMIDATE 2 MG/ML IV SOLN
INTRAVENOUS | Status: AC
  Administered 2021-09-20: 20 mg via INTRAVENOUS
  Filled 2021-09-20: qty 20

## 2021-09-20 MED ORDER — GUAIFENESIN-DM 100-10 MG/5ML PO SYRP
10.0000 mL | ORAL_SOLUTION | ORAL | Status: DC | PRN
Start: 2021-09-20 — End: 2021-10-07
  Administered 2021-09-25: 10 mL
  Filled 2021-09-20 (×2): qty 10

## 2021-09-20 MED ORDER — NOREPINEPHRINE 4 MG/250ML-% IV SOLN: 0.0000 ug/min | INTRAVENOUS | Status: DC

## 2021-09-20 MED ORDER — PHENYLEPHRINE 80 MCG/ML (10ML) SYRINGE FOR IV PUSH (FOR BLOOD PRESSURE SUPPORT)
PREFILLED_SYRINGE | INTRAVENOUS | Status: AC
  Administered 2021-09-20: 800 ug via INTRAVENOUS
  Filled 2021-09-20: qty 10

## 2021-09-20 MED ORDER — ACETAMINOPHEN 650 MG RE SUPP: 650.0000 mg | Freq: Four times a day (QID) | RECTAL | Status: DC | PRN

## 2021-09-20 MED ORDER — SUCRALFATE 1 GM/10ML PO SUSP
1.0000 g | Freq: Three times a day (TID) | ORAL | Status: DC
  Administered 2021-09-20 – 2021-09-26 (×24): 1 g
  Filled 2021-09-20 (×29): qty 10

## 2021-09-20 MED ORDER — MIDAZOLAM HCL 2 MG/2ML IJ SOLN
2.0000 mg | Freq: Once | INTRAMUSCULAR | Status: AC
  Administered 2021-09-20: 2 mg via INTRAVENOUS
  Filled 2021-09-20: qty 2

## 2021-09-20 MED ORDER — FENTANYL 2500MCG IN NS 250ML (10MCG/ML) PREMIX INFUSION
0.0000 ug/h | INTRAVENOUS | Status: DC
  Administered 2021-09-20: 125 ug/h via INTRAVENOUS
  Administered 2021-09-20: 50 ug/h via INTRAVENOUS
  Administered 2021-09-21 – 2021-09-22 (×3): 175 ug/h via INTRAVENOUS
  Administered 2021-09-23: 125 ug/h via INTRAVENOUS
  Administered 2021-09-24: 225 ug/h via INTRAVENOUS
  Administered 2021-09-24: 175 ug/h via INTRAVENOUS
  Filled 2021-09-20 (×8): qty 250

## 2021-09-20 MED ORDER — ETOMIDATE 2 MG/ML IV SOLN: 20.0000 mg | Freq: Once | INTRAVENOUS | Status: AC

## 2021-09-20 MED ORDER — JUVEN PO PACK
1.0000 | PACK | Freq: Two times a day (BID) | ORAL | Status: DC
  Administered 2021-09-20 – 2021-09-26 (×11): 1
  Filled 2021-09-20 (×11): qty 1

## 2021-09-20 MED ORDER — PHENYLEPHRINE 80 MCG/ML (10ML) SYRINGE FOR IV PUSH (FOR BLOOD PRESSURE SUPPORT): 80.0000 ug | PREFILLED_SYRINGE | Freq: Once | INTRAVENOUS | Status: AC | PRN

## 2021-09-20 MED ORDER — VITAMIN B-12 1000 MCG PO TABS
1000.0000 ug | ORAL_TABLET | Freq: Every day | ORAL | Status: DC
  Administered 2021-09-20 – 2021-09-26 (×7): 1000 ug
  Filled 2021-09-20 (×8): qty 1

## 2021-09-20 MED ORDER — FOLIC ACID 1 MG PO TABS
1.0000 mg | ORAL_TABLET | Freq: Every day | ORAL | Status: DC
  Administered 2021-09-20 – 2021-09-26 (×7): 1 mg
  Filled 2021-09-20 (×8): qty 1

## 2021-09-20 MED ORDER — MIDAZOLAM HCL 2 MG/2ML IJ SOLN: 2.0000 mg | Freq: Once | INTRAMUSCULAR | Status: DC

## 2021-09-20 MED ORDER — MAGNESIUM OXIDE -MG SUPPLEMENT 400 (240 MG) MG PO TABS
400.0000 mg | ORAL_TABLET | Freq: Two times a day (BID) | ORAL | Status: DC
  Administered 2021-09-20 – 2021-09-26 (×14): 400 mg
  Filled 2021-09-20 (×15): qty 1

## 2021-09-20 MED ORDER — PHENYLEPHRINE HCL-NACL 20-0.9 MG/250ML-% IV SOLN: 25.0000 ug/min | INTRAVENOUS | Status: DC

## 2021-09-20 MED ORDER — SODIUM CHLORIDE 0.9% FLUSH
10.0000 mL | Freq: Two times a day (BID) | INTRAVENOUS | Status: DC
  Administered 2021-09-20: 40 mL
  Administered 2021-09-20: 10 mL
  Administered 2021-09-21: 30 mL
  Administered 2021-09-21 – 2021-09-23 (×4): 10 mL
  Administered 2021-09-23: 40 mL
  Administered 2021-09-24 – 2021-09-25 (×4): 10 mL
  Administered 2021-09-26: 30 mL
  Administered 2021-09-26 – 2021-09-27 (×2): 10 mL
  Administered 2021-09-27: 30 mL
  Administered 2021-09-28 – 2021-10-11 (×24): 10 mL

## 2021-09-20 MED ORDER — GUAIFENESIN 100 MG/5ML PO LIQD
5.0000 mL | Freq: Four times a day (QID) | ORAL | Status: DC
  Administered 2021-09-20 – 2021-09-29 (×23): 5 mL
  Filled 2021-09-20 (×26): qty 10

## 2021-09-20 MED ORDER — SODIUM CHLORIDE 0.9 % IV SOLN
250.0000 mL | INTRAVENOUS | Status: DC
Start: 2021-09-20 — End: 2021-10-12
  Administered 2021-09-20 – 2021-09-29 (×4): 250 mL via INTRAVENOUS

## 2021-09-20 MED ORDER — FENTANYL CITRATE PF 50 MCG/ML IJ SOSY
25.0000 ug | PREFILLED_SYRINGE | INTRAMUSCULAR | Status: DC | PRN
  Administered 2021-09-20 – 2021-09-25 (×11): 25 ug via INTRAVENOUS

## 2021-09-20 MED ORDER — FENTANYL CITRATE PF 50 MCG/ML IJ SOSY
PREFILLED_SYRINGE | INTRAMUSCULAR | Status: AC
  Administered 2021-09-20: 25 ug via INTRAVENOUS
  Filled 2021-09-20: qty 2

## 2021-09-20 MED ORDER — PROSOURCE TF PO LIQD
45.0000 mL | Freq: Two times a day (BID) | ORAL | Status: DC
  Administered 2021-09-20 – 2021-09-24 (×10): 45 mL
  Filled 2021-09-20 (×10): qty 45

## 2021-09-20 MED ORDER — VITAL HIGH PROTEIN PO LIQD
1000.0000 mL | Freq: Every day | ORAL | Status: DC
  Administered 2021-09-20: 1000 mL

## 2021-09-20 NOTE — Progress Notes (Signed)
Assessed US guided IV. Flushed with 79m NS with good blood return. Arm no more edematous than before placement of IV. IVwatch no longer alarming since replaced and pt is responding to meds given per primary RN. IV Team will remain available as needed.  ?

## 2021-09-20 NOTE — Progress Notes (Signed)
?  Physical Therapy Discharge ?Patient Details ?Name: Terri [name] ?MRN: [id number] ?DOB: 18-Apr-1976 ?Today's Date: 09/20/2021 ?Time:  -  ?  ? ?Patient discharged from PT services secondary to medical decline - will need to re-order PT to resume therapy services. ? ?Please see latest therapy progress note for current level of functioning and progress toward goals.   ? ? ?Terri [name] PT ?Acute Rehabilitation Services ?Pager [phone] ?Office [phone] ? ?GP ?   ? ?Kunal Levario, Shella Maxim ?09/20/2021, 7:15 AM  ?

## 2021-09-20 NOTE — Progress Notes (Signed)
Peripherally Inserted Central Catheter Placement ? ?The IV Nurse has discussed with the patient and/or persons authorized to consent for the patient, the purpose of this procedure and the potential benefits and risks involved with this procedure.  The benefits include less needle sticks, lab draws from the catheter, and the patient may be discharged home with the catheter. Risks include, but not limited to, infection, bleeding, blood clot (thrombus formation), and puncture of an artery; nerve damage and irregular heartbeat and possibility to perform a PICC exchange if needed/ordered by physician.  Alternatives to this procedure were also discussed.  Bard Power PICC patient education guide, fact sheet on infection prevention and patient information card has been provided to patient /or left at bedside. Telephone consent obtained from dtr, [place].  ? ?PICC Placement Documentation  ?PICC Triple Lumen 68/34/19 Right Basilic 35 cm 0 cm (Active)  ?Indication for Insertion or Continuance of Line Prolonged intravenous therapies;Limited venous access - need for IV therapy >5 days (PICC only) 09/20/21 1217  ?Exposed Catheter (cm) 0 cm 09/20/21 1217  ?Site Assessment Clean;Dry;Intact 09/20/21 1217  ?Lumen #1 Status Flushed;Saline locked;Blood return noted 09/20/21 1217  ?Lumen #2 Status Flushed;Saline locked;Blood return noted 09/20/21 1217  ?Lumen #3 Status Flushed;Saline locked;Blood return noted 09/20/21 1217  ?Dressing Type Securing device;Transparent 09/20/21 1217  ?Dressing Status Antimicrobial disc in place 09/20/21 1217  ?Safety Lock Not Applicable 62/22/97 9892  ?Line Care Connections checked and tightened 09/20/21 1217  ?Dressing Intervention New dressing 09/20/21 1217  ?Dressing Change Due 09/27/21 09/20/21 1217  ? ? ? ? ? ?Rolena Infante ?09/20/2021, 12:28 PM ? ?

## 2021-09-20 NOTE — Progress Notes (Signed)
Brief Nutrition Note ? ?Consult received for enteral/tube feeding initiation and management. ? ?Adult Enteral Nutrition Protocol initiated. Full assessment to follow. ? ?Admitting Dx: Melena [K92.1] ?Acute GI bleeding [K92.2] ?Acute upper GI bleed [K92.2] ?Anemia, unspecified type [D64.9] ? ? ?Labs: ? ?Recent Labs  ?Lab 09/14/21 ?0254 09/15/21 ?1641 09/18/21 ?7124 09/19/21 ?5809 09/20/21 ?0251  ?NA 133* 133* 131*  --  132*  ?K 3.8 3.5 3.2* 3.3* 4.7  ?CL 103 103 97*  --  102  ?CO2 '25 27 30  '$ --  25  ?BUN '13 11 13  '$ --  12  ?CREATININE 0.30* 0.45 0.53  --  0.53  ?CALCIUM 7.0* 7.0* 7.0*  --  7.1*  ?MG 2.1  --   --  1.7 2.1  ?PHOS 2.4*  --   --  2.2* 4.1  ?GLUCOSE 83 84 57*  --  61*  ? ? ?Lucas Mallow RD, LDN, CNSC ?Please refer to Amion for contact information.                                                       ?  ?

## 2021-09-20 NOTE — Progress Notes (Signed)
An USGPIV (ultrasound guided PIV) has been placed for short-term vasopressor infusion. A correctly placed ivWatch must be used when administering Vasopressors. Should this treatment be needed beyond 72 hours, central line access should be obtained.  It will be the responsibility of the bedside nurse to follow best practice to prevent extravasations.   ?

## 2021-09-20 NOTE — Progress Notes (Signed)
Pt is agitated and giving this nurse the middle finger through her mittens. Fentanyl and precedex increased per order. ?

## 2021-09-20 NOTE — Procedures (Signed)
Intubation Procedure Note ? ?Terri [name]  ?[id number]  ?November 17, 1975 ? ?Date:09/20/21  ?Time:12:59 AM  ? ?Provider Performing:Kiarrah Rausch [facility]  ? ? ?Procedure: Intubation (30051) ? ?Indication(s) ?Respiratory Failure ? ?Consent ?Unable to obtain consent due to emergent nature of procedure. ? ? ?Anesthesia ?Etomidate and Fentanyl ? ? ?Time Out ?Verified patient identification, verified procedure, site/side was marked, verified correct patient position, special equipment/implants available, medications/allergies/relevant history reviewed, required imaging and test results available. ? ? ?Sterile Technique ?Usual hand hygeine, masks, and gloves were used ? ? ?Procedure Description ?Patient positioned in bed supine.  Sedation given as noted above.  Patient was intubated with endotracheal tube using Glidescope.  View was Grade 1 full glottis .  Number of attempts was 1.  Colorimetric CO2 detector was consistent with tracheal placement. ? ? ?Complications/Tolerance ?None; patient tolerated the procedure well. ?Chest X-ray is ordered to verify placement. ? ? ?EBL ?Minimal ? ? ?Specimen(s) ?None ? ?

## 2021-09-20 NOTE — Progress Notes (Signed)
Interval events noted. Appreciate PCCM care.  ?TRH will sign off for now. ? ?Murray Hodgkins, MD ?[facility] ? ?

## 2021-09-20 NOTE — Progress Notes (Signed)
Cross covering ICU physician:  ? ?Despite pt adequately calm on precedex she is still tachypneic with accessory muscle use and increased wob.  ? ?Will move to intubate at this time.  ?Unfortunately family is unreachable. Hopefully will be able to reach in am.  ?

## 2021-09-20 NOTE — Progress Notes (Signed)
OT Cancellation Note ? ?Patient Details ?Name: Terri [name] ?MRN: [id number] ?DOB: 1975-11-14 ? ? ?Cancelled Treatment:    Reason Eval/Treat Not Completed: Medical issues which prohibited therapy ?Patient was intubated early this AM. OT to signing off at this time. Please re order when patient is more medically stable.  ? ?Anvay Tennis OTR/L, MS ?Acute Rehabilitation Department ?Office# [phone] ?Pager# [phone] ? ? ?09/20/2021, 6:40 AM ?

## 2021-09-20 NOTE — Progress Notes (Signed)
Spoke with primary RN. US guided IV flushes fine and has blood return, although IVwatch alarming. Pt arm edematous prior to IV start. Primary RN will continue to monitor site and adjust IVwatch. This RN will also assess site as soon as possible. ?

## 2021-09-20 NOTE — Progress Notes (Signed)
Lower extremity venous has been completed.  ? ?Preliminary results in CV Proc.  ? ?Terri [name] ?09/20/2021 9:48 AM    ?

## 2021-09-20 NOTE — Consult Note (Signed)
? ?                                                                                ?Consultation Note ?Date: 09/20/2021  ? ?Patient Name: Terri [name]  ?DOB: [birth year]  MRN: [id number]  Age / Sex: 46 y.o., female  ?PCP: Pcp, No ?Referring Physician: Freddi Starr, MD ? ?Reason for Consultation: Establishing goals of care ? ?HPI/Patient Profile: 46 y.o. female   admitted on 08/31/2021   ? ?Clinical Assessment and Goals of Care: ?46 year old lady with history of recurrent GI bleed, polysubstance use, admitted to the hospital with dark-colored stools with concern for chronic GI bleed secondary to peptic ulcers.  She underwent endoscopy was found to have large anastomotic ulceration and, started on proton pump inhibitors and hemoglobin stabilized.  Hospital course also complicated by MSSA bacteremia appearing to be disseminated, left great toe noted to be gangrenous, amputation was deemed to be next best step and this was done on 4-26.  Antibiotics broadened to vancomycin to include MRSA, hospital course also complicated by patient having been found to have multiple liver abscesses, patient with increasing oxygen support initially required BiPAP and then also required intubation and mechanical ventilation. ?PMT service consult has been requested for establishing goals of care. ?Patient was briefly seen on 09-19-2021 by this Probation officer.  She was resting in bed with a safety sitter at bedside.  She seemed very lethargic and did not respond much.  On 09-19-2021, call placed and was able to reach daughter [name].  We discussed that the patient has had her surgery and that the patient is not being transferred to [facility].  Discussed about full code/full scope and events pertaining to current hospitalization thus far. ?Overnight events noted.  Patient with escalating respiratory distress required intubation and mechanical ventilation.  Currently intubated.  Chart reviewed.  Patient seen and examined.  Call  placed again today to update and further discuss goals of care with daughter, unable to reach.  See below.  Thank you for the consult.  PMT to follow along. ? ?NEXT OF KIN ?Daughter [name] ? ?SUMMARY OF RECOMMENDATIONS   ?Full code/full scope care for now. ?Palliative medicine team to continue to follow along, patient remains critically ill, has high risk of ongoing decline and decompensation in spite of current interventions.  We will continue efforts to get in touch with daughter [name] at [phone].  Thank you for the consult.   ?Code Status/Advance Care Planning: ?Full code ? ? ?Symptom Management:  ?  ? ?Palliative Prophylaxis:  ?Delirium Protocol ? ?Additional Recommendations (Limitations, Scope, Preferences): ?Full Scope Treatment ? ?Psycho-social/Spiritual:  ?Desire for further Chaplaincy support:yes ?Additional Recommendations: Caregiving  Support/Resources ? ?Prognosis:  ?Unable to determine ? ?Discharge Planning: To Be Determined  ? ?  ? ?Primary Diagnoses: ?Present on Admission: ? (Resolved) Acute upper GI bleed ? Acute on chronic blood loss anemia ? Portal vein thrombosis ? (Resolved) Lactic acidosis ? Polysubstance abuse ([place]) ? Hypokalemia ? ? ?I have reviewed the medical record, interviewed the patient and family, and examined the patient. The following aspects are pertinent. ? ?Past Medical History:  ?Diagnosis Date  ? Arthritis   ?  Back and legs  ? Chronic back pain   ? DVT (deep venous thrombosis) ([place])   ? early 20's leg  ? GIB (gastrointestinal bleeding) 09/13/2017  ? History of blood transfusion   ? Hypertension   ? Migraine headache   ? Mood disorder ([place])   ? previously documented as bipolar and MDD  ? Neuropathy   ? Pneumonia   ? 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 Sciatica   ? Stress incontinence   ? ?Social History  ? ?Socioeconomic History  ? Marital status: Divorced  ?  Spouse name: Not on file  ? Number of children: Not on file  ? Years of education: Not on file  ?  Highest education level: Not on file  ?Occupational History  ? Not on file  ?Tobacco Use  ? Smoking status: Every Day  ?  Packs/day: 1.00  ?  Years: 29.00  ?  Pack years: 29.00  ?  Types: Cigarettes  ? Smokeless tobacco: Never  ?Vaping Use  ? Vaping Use: Never used  ?Substance and Sexual Activity  ? Alcohol use: No  ? Drug use: Yes  ?  Types: Cocaine, Heroin  ?  Comment: Patient states she smokes cocaine  ? Sexual activity: Yes  ?  Birth control/protection: None  ?Other Topics Concern  ? Not on file  ?Social History Narrative  ? Not on file  ? ?Social Determinants of Health  ? ?Financial Resource Strain: Not on file  ?Food Insecurity: Not on file  ?Transportation Needs: Not on file  ?Physical Activity: Not on file  ?Stress: Not on file  ?Social Connections: Not on file  ? ?Family History  ?Problem Relation Age of Onset  ? Diabetes Mother   ? Hypertension Mother   ? Diabetes Father   ? Hypertension Father   ? ?Scheduled Meds: ? chlorhexidine gluconate (MEDLINE KIT)  15 mL Mouth Rinse BID  ? Chlorhexidine Gluconate Cloth  6 each Topical Q0600  ? feeding supplement (PROSource TF)  45 mL Per Tube BID  ? feeding supplement (VITAL HIGH PROTEIN)  1,000 mL Per Tube Daily  ? fentaNYL      ? folic acid  1 mg Per Tube Daily  ? guaiFENesin  5 mL Per Tube Q6H  ? levalbuterol  0.63 mg Nebulization Q6H  ? lidocaine  1 patch Transdermal Daily  ? magnesium oxide  400 mg Per Tube BID  ? mouth rinse  15 mL Mouth Rinse 10 times per day  ? midazolam      ? midazolam  2 mg Intravenous Once  ? multivitamin  15 mL Per Tube Daily  ? mupirocin ointment  1 application. Nasal BID  ? nicotine  21 mg Transdermal Daily  ? nutrition supplement (JUVEN)  1 packet Per Tube BID WC  ? pantoprazole (PROTONIX) IV  40 mg Intravenous Q12H  ? sucralfate  1 g Per Tube TID WC & HS  ? vitamin B-12  1,000 mcg Per Tube Daily  ? ?Continuous Infusions: ? sodium chloride    ? sodium chloride    ? sodium chloride 10 mL/hr at 09/20/21 1217  ? ampicillin-sulbactam  (UNASYN) IV Stopped (09/20/21 1155)  ? dexmedetomidine (PRECEDEX) IV infusion Stopped (09/20/21 0416)  ? dextrose 5 % and 0.45% NaCl 75 mL/hr at 09/20/21 1217  ? fentaNYL infusion INTRAVENOUS 100 mcg/hr (09/20/21 1217)  ? norepinephrine (LEVOPHED) Adult infusion    ? vancomycin Stopped (09/20/21 0029)  ? ?PRN Meds:.sodium chloride,  acetaminophen (TYLENOL) oral liquid 160 mg/5 mL **OR** acetaminophen, dextrose, fentaNYL (SUBLIMAZE) injection, guaiFENesin-dextromethorphan, haloperidol lactate **AND** [DISCONTINUED] LORazepam, lip balm, naLOXone (NARCAN)  injection, ondansetron (ZOFRAN) IV, saline ?Medications Prior to Admission:  ?Prior to Admission medications   ?Medication Sig Start Date End Date Taking? Authorizing Provider  ?acetaminophen (TYLENOL) 500 MG tablet Take 2 tablets (1,000 mg total) by mouth every 8 (eight) hours as needed for mild pain. ?Patient not taking: Reported on 09/01/2021 05/29/21   Mariel Aloe, MD  ?amLODipine (NORVASC) 10 MG tablet Take 1 tablet (10 mg total) by mouth daily. ?Patient not taking: Reported on 09/01/2021 05/30/21 08/28/21  Jacqlyn Larsen, PA-C  ?Ferrous Fumarate (IRON) 18 MG TBCR Take 1 tablet (18 mg total) by mouth 2 (two) times daily after a meal. ?Patient not taking: Reported on 09/01/2021 04/06/21   Domenic Polite, MD  ?hydrALAZINE (APRESOLINE) 50 MG tablet Take 1 tablet (50 mg total) by mouth 2 (two) times daily. ?Patient not taking: Reported on 09/01/2021 05/30/21 08/28/21  Jacqlyn Larsen, PA-C  ?hydrOXYzine (ATARAX/VISTARIL) 25 MG tablet Take 1 tablet (25 mg total) by mouth every 8 (eight) hours as needed for anxiety. ?Patient not taking: Reported on 09/01/2021 04/06/21   Domenic Polite, MD  ?lisinopril (ZESTRIL) 10 MG tablet Take 1 tablet (10 mg total) by mouth daily. ?Patient not taking: Reported on 09/01/2021 05/30/21 08/28/21  Jacqlyn Larsen, PA-C  ?nicotine (NICODERM CQ - DOSED IN MG/24 HOURS) 21 mg/24hr patch Place 1 patch (21 mg total) onto the skin daily. ?Patient not taking:  Reported on 09/01/2021 04/07/21   Domenic Polite, MD  ?ondansetron (ZOFRAN-ODT) 4 MG disintegrating tablet Take 1 tablet (4 mg total) by mouth every 8 (eight) hours as needed for nausea or vomiting. ?Patient

## 2021-09-20 NOTE — Consult Note (Addendum)
? ?NAME:  Terri [name], MRN:  [id number], DOB:  05-26-1975, LOS: 36 ?ADMISSION DATE:  08/31/2021, CONSULTATION DATE:  09/19/21 ?REFERRING MD:  E link doctor, CHIEF COMPLAINT:  dark colored stool   ? ?History of Present Illness:  ?46 yo female with pmh homelessness, recurrent gib, chronic microcytic anemia, htn, polysubstance abuse, roux en y presented to [facility] with reports of dark colored stools. Pt has history of chronic gib 2/2 peptic ulcers. She underwent endoscopy with large anastomotic ulceration noted and started on ppi. Hgb stabliized.  ? ?She was then found to have mssa bacteremia that appears to be disseminated. Her L great toe was noted to be gangrenous vascular surgery and orthopedics determined amputation would be best route for pt and this was done on 4/26. Cx from this was MRSA and she was broadened to vancomycin. She has multiple liver abscess noted and a history of portal v thrombosis that is reportedly stable. She has not had any a/c 2/2 GIB's.  ? ?She is being treated for 6 weeks of iv abx. Over the past two - three days pt has req increasing oxygen support ultimately req transfer to ICU. She was placed on bipap overnight and ccm was then consulted for evaluation for possible intubation.  ? ?All history is obtained from chart and RN as pt is not oriented for any discussion.  ? ?Pertinent  Medical History  ?Portal v thrombosis, polysubstance abuse, chronic anemia,  ? ?Significant Hospital Events: ?Including procedures, antibiotic start and stop dates in addition to other pertinent events   ?4/10 admitted ?4/13 egd for gib ?4/15 vascular surgery consulted for gangrenous L toe ?4/18 ID consulted for mssa infection, liver abscess, osteo, mrsa pneumonia ?4/23 Thoracentesis, culture negative, exudative ?4/26: ortho consulted for amputation of L great toe ?4/27 psych consulted for capacity, pt was placed on ivc ?4/28: transferred to ICU for increasing oxygen requirement ?4/29: ccm consulted  ? ?Interim  History / Subjective:  ? ?Patient intubated overnight ?No other acute events ? ?Objective   ?Blood pressure 102/71, pulse 88, temperature (!) 101.2 ?F (38.4 ?C), temperature source Axillary, resp. rate 19, height '5\' 9"'$  (1.753 m), weight 66.7 kg, SpO2 100 %. ?   ?Vent Mode: PRVC ?FiO2 (%):  [70 %-90 %] 70 % ?Set Rate:  [18 bmp-20 bmp] 18 bmp ?Vt Set:  [530 mL] 530 mL ?PEEP:  [5 cmH20] 5 cmH20 ?Plateau Pressure:  [11 [id number]] 3220 cmH20  ? ?Intake/Output Summary (Last 24 hours) at 09/20/2021 0734 ?Last data filed at 09/20/2021 0346 ?Gross per 24 hour  ?Intake 3745.07 ml  ?Output 1700 ml  ?Net 2045.07 ml  ? ?Filed Weights  ? 09/17/21 0500 09/18/21 0342 09/18/21 1742  ?Weight: 63.9 kg 63 kg 66.7 kg  ? ? ?Examination: ?General: cachectic female, intubated, sedated ?HENT: [place]/AT, moist mucous membranes, ET tube in place ?Lungs: coarse rhonchi throughout ?Cardiovascular: tachycardic. No murmurs ?Abdomen: soft, non-distended, non-tender, bs + ?Extremities: with L great toe amputation and bandage in place c/d/i ?Neuro: sedated ?GU: n/a ? ?Resolved Hospital Problem list   ? ? ?Assessment & Plan:  ?Acute hypoxic hypercarbic resp failure ?MRSA pneumonia ?Bilateral Pleural Effusions, R > L ?- Continue mechanical ventilatory support ?- Continue Vancomycin for MRSA pneumonia ?- Right thoracentesis performed 4/23, exudative based on pleural fluid studies, culture negative ?- Bedside US shows small simple right effusion, will not drain again at this time. Will continue to monitor. ?- f/u lower extremity dopplers ? 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 -Unasyn + vancomycin per ID ? ?Septic Shock ?- due to the above ?- continue levophed, MAP 65 or greater ? ?Chronic microcytic anemia ?- Monitor ? ?GIB ?Portal vein thrombosis ?-no acute indication for transfusion ?-cont ppi ?-per GI not a candidate for full dose a/c  ? ?Hypokalemia ?Hyponatremia ?Hypoglycemia ?-monitor and replace as  able ? ?Homelessness:  ?Polysubstance abuse ?-case management aware ?-psych also following for IVC ? ?Severe protein calorie malnutrition ?- tube feeds ?- nutrition consult ? ?Vitamin D Deficiency, mild ?- will need supplementation as outpatient ? ?Best Practice (right click and "Reselect all SmartList Selections" daily)  ? ?Diet/type: tubefeeds ?DVT prophylaxis: other ortho recommended aspirin ?GI prophylaxis: PPI ?Lines: N/A ?Foley:  N/A ?Code Status:  full code ?Last date of multidisciplinary goals of care discussion [unable to reach family at this time. Pt is not oriented and at some point was ivc'd 2/2 lack of capacity] ? ?Labs   ?CBC: ?Recent Labs  ?Lab 09/14/21 ?0254 09/15/21 ?1641 09/18/21 ?0846  ?WBC 7.4 5.0 5.0  ?NEUTROABS 5.6 3.9 3.8  ?HGB 7.8* 8.0* 9.0*  ?HCT 23.8* 24.9* 28.0*  ?MCV 78.3* 77.3* 77.6*  ?PLT 166 124* 179  ? ? ?Basic Metabolic Panel: ?Recent Labs  ?Lab 09/14/21 ?0254 09/15/21 ?1641 09/18/21 ?9417 09/19/21 ?4081 09/20/21 ?0251  ?NA 133* 133* 131*  --  132*  ?K 3.8 3.5 3.2* 3.3* 4.7  ?CL 103 103 97*  --  102  ?CO2 '25 27 30  '$ --  25  ?GLUCOSE 83 84 57*  --  61*  ?BUN '13 11 13  '$ --  12  ?CREATININE 0.30* 0.45 0.53  --  0.53  ?CALCIUM 7.0* 7.0* 7.0*  --  7.1*  ?MG 2.1  --   --  1.7 2.1  ?PHOS 2.4*  --   --  2.2* 4.1  ? ?GFR: ?Estimated Creatinine Clearance: 90.9 mL/min (by C-G formula based on SCr of 0.53 mg/dL). ?Recent Labs  ?Lab 09/14/21 ?0254 09/15/21 ?1641 09/18/21 ?4481 09/18/21 ?1226  ?WBC 7.4 5.0 5.0  --   ?LATICACIDVEN  --   --  0.8 1.5  ? ? ?Liver Function Tests: ?Recent Labs  ?Lab 09/14/21 ?0254 09/15/21 ?1641 09/18/21 ?0846  ?AST '21 17 29  '$ ?ALT <5 7 <5  ?ALKPHOS 169* 153* 138*  ?BILITOT 0.6 0.3 0.3  ?PROT 4.3* 4.2* 4.6*  ?ALBUMIN <1.5* <1.5* <1.5*  ? ?No results for input(s): LIPASE, AMYLASE in the last 168 hours. ?Recent Labs  ?Lab 09/19/21 ?2213  ?AMMONIA 35  ? ? ?ABG ?   ?Component Value Date/Time  ? PHART 7.44 09/20/2021 0342  ? PCO2ART 43 09/20/2021 0342  ? PO2ART 77 (L)  09/20/2021 0342  ? HCO3 29.2 (H) 09/20/2021 0342  ? ACIDBASEDEF 2.1 (H) 04/13/2021 0224  ? O2SAT 96.8 09/20/2021 0342  ?  ? ?Coagulation Profile: ?Recent Labs  ?Lab 09/18/21 ?0846  ?INR 1.2  ? ? ?Cardiac Enzymes: ?No results for input(s): CKTOTAL, CKMB, CKMBINDEX, TROPONINI in the last 168 hours. ? ?HbA1C: ?Hgb A1c MFr Bld  ?Date/Time Value Ref Range Status  ?09/12/2021 03:29 AM 5.2 4.8 - 5.6 % Final  ?  Comment:  ?  (NOTE) ?Pre diabetes:          5.7%-6.4% ? ?Diabetes:              >6.4% ? ?Glycemic control for   <7.0% ?adults with diabetes ?  ?02/03/2019 05:15 AM 5.2 4.8 - 5.6 % Final  ?  Comment:  ?  (  NOTE) ?        Prediabetes: 5.7 - 6.4 ?        Diabetes: >6.4 ?        Glycemic control for adults with diabetes: <7.0 ?  ? ? ?CBG: ?Recent Labs  ?Lab 09/18/21 ?1405 09/19/21 ?0732 09/19/21 ?0820 09/20/21 ?0016 09/20/21 ?4290  ?GLUCAP 103* 65* 92 67* 77  ? ? ?Critical care time: 45 minutes  ? ?Freda Jackson, MD ?[facility] ?Office: [phone] ? ? ?See Amion for personal pager ?PCCM on call pager [phone] until 7pm. ?Please call Elink 7p-7a. [phone] ? ? ?  ?

## 2021-09-20 NOTE — Progress Notes (Signed)
Resp culture collected and send down to lab for analysis.  ?

## 2021-09-21 ENCOUNTER — Inpatient Hospital Stay (HOSPITAL_COMMUNITY): Payer: Medicaid Other

## 2021-09-21 DIAGNOSIS — A4901 Methicillin susceptible Staphylococcus aureus infection, unspecified site: Secondary | ICD-10-CM | POA: Diagnosis not present

## 2021-09-21 DIAGNOSIS — K750 Abscess of liver: Secondary | ICD-10-CM | POA: Diagnosis not present

## 2021-09-21 DIAGNOSIS — K922 Gastrointestinal hemorrhage, unspecified: Secondary | ICD-10-CM | POA: Diagnosis not present

## 2021-09-21 DIAGNOSIS — J9601 Acute respiratory failure with hypoxia: Secondary | ICD-10-CM | POA: Diagnosis not present

## 2021-09-21 DIAGNOSIS — J181 Lobar pneumonia, unspecified organism: Secondary | ICD-10-CM | POA: Diagnosis not present

## 2021-09-21 DIAGNOSIS — E43 Unspecified severe protein-calorie malnutrition: Secondary | ICD-10-CM | POA: Diagnosis present

## 2021-09-21 LAB — GLUCOSE, CAPILLARY
Glucose-Capillary: 73 mg/dL (ref 70–99)
Glucose-Capillary: 73 mg/dL (ref 70–99)
Glucose-Capillary: 88 mg/dL (ref 70–99)
Glucose-Capillary: 93 mg/dL (ref 70–99)
Glucose-Capillary: 99 mg/dL (ref 70–99)
Glucose-Capillary: 91 mg/dL (ref 70–99)

## 2021-09-21 LAB — BASIC METABOLIC PANEL
Anion gap: 3 — ABNORMAL LOW (ref 5–15)
BUN: 21 mg/dL — ABNORMAL HIGH (ref 6–20)
CO2: 29 mmol/L (ref 22–32)
Calcium: 7.3 mg/dL — ABNORMAL LOW (ref 8.9–10.3)
Chloride: 102 mmol/L (ref 98–111)
Creatinine, Ser: 0.51 mg/dL (ref 0.44–1.00)
GFR, Estimated: >60 mL/min (ref >60)
Glucose, Bld: 94 mg/dL (ref 70–99)
Potassium: 3.4 mmol/L — ABNORMAL LOW (ref 3.5–5.1)
Sodium: 134 mmol/L — ABNORMAL LOW (ref 135–145)

## 2021-09-21 LAB — PHOSPHORUS
Phosphorus: 3.6 mg/dL (ref 2.5–4.6)
Phosphorus: 3.4 mg/dL (ref 2.5–4.6)

## 2021-09-21 LAB — MAGNESIUM
Magnesium: 1.8 mg/dL (ref 1.7–2.4)
Magnesium: 2.2 mg/dL (ref 1.7–2.4)

## 2021-09-21 LAB — VANCOMYCIN, PEAK: Vancomycin Pk: >60 ug/mL (ref 30–40)

## 2021-09-21 LAB — TRIGLYCERIDES: Triglycerides: 83 mg/dL

## 2021-09-21 MED ORDER — VANCOMYCIN VARIABLE DOSE PER UNSTABLE RENAL FUNCTION (PHARMACIST DOSING): Status: DC

## 2021-09-21 MED ORDER — MAGNESIUM SULFATE 2 GM/50ML IV SOLN
2.0000 g | Freq: Once | INTRAVENOUS | Status: AC
Start: 2021-09-21 — End: 2021-09-21
  Administered 2021-09-21: 2 g via INTRAVENOUS
  Filled 2021-09-21: qty 50

## 2021-09-21 MED ORDER — VITAL 1.5 CAL PO LIQD
1000.0000 mL | ORAL | Status: DC
  Administered 2021-09-21 – 2021-09-25 (×5): 1000 mL
  Filled 2021-09-21 (×5): qty 1000

## 2021-09-21 MED ORDER — POTASSIUM CHLORIDE 20 MEQ PO PACK
40.0000 meq | PACK | Freq: Once | ORAL | Status: AC
  Administered 2021-09-21: 40 meq
  Filled 2021-09-21: qty 2

## 2021-09-21 NOTE — Progress Notes (Signed)
Nutrition Follow-up ? ?INTERVENTION:  ? ?-Initiate Vital 1.5 @ 40 ml/hr via small bore NGT. Advance by 10 ml every 12 hours to goal rate of 50 ml/hr. ?-Continue 45 ml Prosource TF BID ?-Provides 1880 kcals, 103g protein and 916 ml H2O ? ?-1 packet Juven BID via tube, each packet provides 95 calories, 2.5 grams of protein (collagen), and 9.8 grams of carbohydrate (3 grams sugar); also contains 7 grams of L-arginine and L-glutamine, 300 mg vitamin C, 15 mg vitamin E, 1.2 mcg vitamin B-12, 9.5 mg zinc, 200 mg calcium, and 1.5 g  Calcium Beta-hydroxy-Beta-methylbutyrate to support wound healing  ? ?-Continue MVI via tube ? ?NEW NUTRITION DIAGNOSIS:  ? ?Severe Malnutrition related to acute illness (GIB, thrush, liver abscess) as evidenced by energy intake < or equal to 50% for > or equal to 5 days, moderate fat depletion, severe muscle depletion. ? ?GOAL:  ? ?Patient will meet greater than or equal to 90% of their needs ? ?Progressing with TF ? ?MONITOR:  ? ?PO intake, Supplement acceptance, Labs, Weight trends, I & O's, Skin ? ?REASON FOR ASSESSMENT:  ? ?Consult ?Enteral/tube feeding initiation and management ? ?ASSESSMENT:  ? ?46 year old woman admitted for acute GIB. EGD showed esophageal plaques consistent with candidiasis, large cratered nonbleeding duodenal ulcer with no stigmata of bleeding.  CT angiogram on 08/31/2021 showed multiple low-attenuation lesions throughout the liver seen on recent prior exam, highly concerning for hepatic abscesses or metastasis.  Now s/p IR US guided bx and aspiration, revealing MSSA abscess. ? ?4/10: admitted ?4/13: EGD ?4/17: s/p liver biopsy ?4/23: s/p thoracentesis -1.2L yield ?4/27: s/p Left great toe disarticulation at the first metatarsophalangeal joint, I&D of left forefoot ?4/30: Small bore NGT placed ? ?Patient is currently intubated on ventilator support ?MV: 8.9 L/min ?Temp (24hrs), Avg:99.6 ?F (37.6 ?C), Min:98.2 ?F (36.8 ?C), Max:101.1 ?F (38.4 ?C) ? ? ?Currently pt  receiving Vital HP @ 40 ml/hr with 45 ml Prosource TF BID via small bore  which provides 1040 kcals and 106g protein. ?Will adjust TF order to better meet pt's nutritional needs.  ?Currently tip is located within gastric body per chart review. ? ?Propofol: 6 ml/hr -provides ~158 fat kcals ? ?Admission weight: 117 lbs ?Current weight: 147 lbss ? ?I/Os:  +3.9L since 4/17 ? ?Medications: Folic acid, MAG-OX, Liquid MVI, Carafate, Vitamin B-12, IV Mg sulfate ? ?Labs reviewed: ?CBGs: 73-98 ?Low Na, K ?TG: 83 ? ?NUTRITION - FOCUSED PHYSICAL EXAM: ? ?Flowsheet Row Most Recent Value  ?Orbital Region Moderate depletion  ?Upper Arm Region No depletion  [edema]  ?Thoracic and Lumbar Region Mild depletion  ?Buccal Region Unable to assess  -area covered at this time  ?Temple Region Severe depletion  ?Clavicle Bone Region Severe depletion  ?Clavicle and Acromion Bone Region Severe depletion  ?Scapular Bone Region Severe depletion  ?Dorsal Hand No depletion  [edema]  ?Patellar Region Moderate depletion  ?Anterior Thigh Region Moderate depletion  ?Posterior Calf Region Moderate depletion  ?Edema (RD Assessment) Severe  [severe RLE, moderate LLE, BUEs]  ?Hair Reviewed  ?Eyes Unable to assess  [closed]  ?Mouth Unable to assess  [intubated]  ?Skin Reviewed  [some scabbing]  ?Nails Reviewed  [dirty, some ridges]  ? ?  ? ? ?Diet Order:   ?Diet Order   ? ?       ?  Diet NPO time specified  Diet effective now       ?  ? ?  ?  ? ?  ? ? ?EDUCATION NEEDS:  ? ?  Not appropriate for education at this time ? ?Skin:  Skin Assessment: Skin Integrity Issues: ?Skin Integrity Issues:: Other (Comment) ?Other: left forefoot abscess, left great toe osteomyelitis ? ?Last BM:  4/29 -type 1 & 2 ? ?Height:  ? ?Ht Readings from Last 1 Encounters:  ?09/18/21 '5\' 9"'$  (1.753 m)  ? ? ?Weight:  ? ?Wt Readings from Last 1 Encounters:  ?09/18/21 66.7 kg  ? ? ?BMI:  Body mass index is 21.72 kg/m?. ? ?Estimated Nutritional Needs:  ? ?Kcal:  1900-2100 ? ?Protein:   90-105g ? ?Fluid:  2L/day ? ?Clayton Bibles, MS, RD, LDN ?Inpatient Clinical Dietitian ?Contact information available via Amion ? ?

## 2021-09-21 NOTE — Progress Notes (Signed)
Pharmacy Antibiotic Note ? ?Terri [name] is a 46 y.o. female admitted on 08/31/2021 with disseminated staph infection - growing both MSSA and MRSA from cultures with recent concern for MRSA PNA. Pharmacy has been consulted for Vancomycin dosing. ? ?The patient had an elevated Vancomycin peak (>60) yesterday however a random this AM shows a more reasonable level at 24 mcg/ml. SCr has remained stable but is higher than earlier this admission (0.51 on 5/2, 0.3 on 4/24), UOP also appears reduced. Due to this - will plan to avoid Vancomycin for now and add Ceftaroline to cover for MRSA/MSSA + Flagyl for bacteroides coverage ? ?Plan: ?- D/c Vancomycin  ?- Start Cefaroline 600 mg IV every 8 hours + Flagyl 500 mg PT bid ?- Will monitor SCr/UOP changes closely ? ?Height: '5\' 9"'$  (175.3 cm) ?Weight: 66.7 kg (147 lb 0.8 oz) ?IBW/kg (Calculated) : 66.2 ? ?Temp (24hrs), Avg:99.5 ?F (37.5 ?C), Min:98.2 ?F (36.8 ?C), Max:101.1 ?F (38.4 ?C) ? ?Recent Labs  ?Lab 09/15/21 ?1641 09/18/21 ?8144 09/18/21 ?1226 09/20/21 ?0251 09/21/21 ?8185 09/21/21 ?1402  ?WBC 5.0 5.0  --   --   --   --   ?CREATININE 0.45 0.53  --  0.53 0.51  --   ?LATICACIDVEN  --  0.8 1.5  --   --   --   ?VANCOPEAK  --   --   --   --   --  >60*  ? ?  ?Estimated Creatinine Clearance: 90.9 mL/min (by C-G formula based on SCr of 0.51 mg/dL).   ? ?Allergies  ?Allergen Reactions  ? Nsaids Other (See Comments)  ?  Stomach ulcer ?  ? Ketorolac Tromethamine Itching and Nausea And Vomiting  ? ? ?Antimicrobials this admission: ?Fluconazole 4/13 >> 4/30 ?CTX 4/18 x 1 ?Vancomycin 4/18 x 1; restart 4/27 >> 5/1 ?Cefazolin 4/19 >> 4/28 ?Metronidazole 4/24 >> 4/28; restart 5/2 ?Zosyn 4/28 x 1 ?Unasyn 4/28 >> 5/1 ?Ceftaroline 5/2 >> ? ?Dose adjustments this admission: ?N/a ? ?Microbiology results: ?4/17 Liver Abscess cx >> MSSA ?4/18 BCx >> ngF ?4/25 MRSA PCR >> positive  ?4/25 Peritoneal fluid >> ngx3d ? ?Thank you for allowing pharmacy to be a part of this patient?s care. ? ?Alycia Rossetti, PharmD, BCPS ?Infectious Diseases Clinical Pharmacist ?09/21/2021 3:51 PM  ? ?**Pharmacist phone directory can now be found on [email or web address] ([email or web address]).  Listed under [place].  ? ?

## 2021-09-21 NOTE — Progress Notes (Signed)
SLP Cancellation Note ? ?Patient Details ?Name: Terri [name] ?MRN: [id number] ?DOB: 03/31/1976 ? ? ?Cancelled treatment:       Reason Eval/Treat Not Completed: Medical issues which prohibited therapy;Other (comment) (patient currently on ventilator. SLP will continue to follow for readiness/willingness to participate in objective swallow study.) ? ? ?Sonia Baller, MA, CCC-SLP ?Speech Therapy ? ?

## 2021-09-21 NOTE — Consult Note (Signed)
? ?NAME:  Terri [name], MRN:  [id number], DOB:  10-17-1975, LOS: 12 ?ADMISSION DATE:  08/31/2021, CONSULTATION DATE:  09/19/21 ?REFERRING MD:  E link doctor, CHIEF COMPLAINT:  dark colored stool   ? ?BRIEF  ?46 yo female with pmh homelessness, recurrent gib, chronic microcytic anemia, htn, polysubstance abuse, roux en y presented to [facility] with reports of dark colored stools. Pt has history of chronic gib 2/2 peptic ulcers. She underwent endoscopy with large anastomotic ulceration noted and started on ppi. Hgb stabliized.  ? ?She was then found to have mssa bacteremia that appears to be disseminated. Her L great toe was noted to be gangrenous vascular surgery and orthopedics determined amputation would be best route for pt and this was done on 4/26. Cx from this was MRSA and she was broadened to vancomycin. She has multiple liver abscess noted and a history of portal v thrombosis that is reportedly stable. She has not had any a/c 2/2 GIB's.  ? ?She is being treated for 6 weeks of iv abx. Over the past two - three days pt has req increasing oxygen support ultimately req transfer to ICU. She was placed on bipap overnight and ccm was then consulted for evaluation for possible intubation.  ? ?All history is obtained from chart and RN as pt is not oriented for any discussion.  ? ?Pertinent  Medical History  ?Portal v thrombosis, polysubstance abuse, chronic anemia,  ? ?Significant Hospital Events: ?Including procedures, antibiotic start and stop dates in addition to other pertinent events   ?4/10 admitted ?4/13 egd for gib ?4/15 vascular surgery consulted for gangrenous L toe ?4/18 ID consulted for mssa infection, liver abscess, osteo, mrsa pneumonia ?4/23 Thoracentesis, culture negative, exudative ?4/26: ortho consulted for amputation of L great toe ?4/27 psych consulted for capacity, pt was placed on ivc ?4/28: transferred to ICU for increasing oxygen requirement ?4/29: ccm consulted  ?4/30 - Patient intubated  ?Palliative  care consult - full code ? ?Interim History / Subjective:  ? ?5/1- 50% fio2, On diprivan gtt, On fent gtt. Had TF via nasal panda. Off levophed gtt. Up and down temp ? ?Objective   ?Blood pressure 117/83, pulse 99, temperature (!) 100.4 ?F (38 ?C), resp. rate 17, height '5\' 9"'$  (1.753 m), weight 66.7 kg, SpO2 100 %. ?   ?Vent Mode: PRVC ?FiO2 (%):  [40 %-50 %] 50 % ?Set Rate:  [18 bmp] 18 bmp ?Vt Set:  [530 mL] 530 mL ?PEEP:  [8 cmH20] 8 cmH20 ?Plateau Pressure:  [15 cmH20-22 cmH20] 16 cmH20  ? ?Intake/Output Summary (Last 24 hours) at 09/21/2021 1037 ?Last data filed at 09/21/2021 9628 ?Gross per 24 hour  ?Intake 1987.5 ml  ?Output 1335 ml  ?Net 652.5 ml  ? ?Filed Weights  ? 09/17/21 0500 09/18/21 0342 09/18/21 1742  ?Weight: 63.9 kg 63 kg 66.7 kg  ? ? ?Examination: ?General Appearance:  Looks criticall ill OBESE - NO . She is very cachectic,  asthenic, malnourished ?Head:  Normocephalic, without obvious abnormality, atraumatic ?Eyes:  PERRL - yes, conjunctiva/corneas - muddy     ?Ears:  Normal external ear canals, both ears ?Nose:  G tube - NO but has PANDA ?Throat:  ETT TUBE - YES, OG tube - NO ?Neck:  Supple,  No enlargement/tenderness/nodules ?Lungs: Clear to auscultation bilaterally, Ventilator   Synchrony - yes ?Heart:  S1 and S2 normal, no murmur, CVP - no.  Pressors - no ?Abdomen:  Soft, no masses, no organomegaly ?Genitalia / Rectal:  Not done ?  Extremities:  Extremities- intact but emacitated ?Skin:  ntact in exposed areas . Sacral area - not examined ?Neurologic:  Sedation - fent gtt, diprivan gtt -> RASS - -3/-4 ? ? ? ?Resolved Hospital Problem list   ? ? ?Assessment & Plan:  ?Acute hypoxic hypercarbic resp failure - negative duplex LE 4/30. Intubated 09/20/21 ?MRSA pneumonia ?Bilateral Pleural Effusions, R > L -  Right thoracentesis performed 4/23, exudative based on pleural fluid studies, culture negative ? ?09/21/2021 - > does not meet criteria for SBT/Extubation in setting of Acute Respiratory Failure due to  agiated encephalopathy and sepsis ? ?Plan ?- Continue mechanical ventilatory support ?- Continue Vancomycin for MRSA pneumonia ?-- Bedside US shows small simple right effusion, will not drain again at this time. Will continue to monitor. ? ? 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  ?Plan ?-Unasyn + vancomycin per ID ? ?New abscess > 5cm v mass LEft groin on Duplex LE 09/20/21 ? ?Plan ? - urgent groin Non-vascular US ?-  ? ?Septic Shock - - due to the above ? ? ?09/21/2021 - off levophed ? ?Plan ?- goal  MAP 65 or greater ? ?Chronic microcytic anemia ? ?Plan ?- Monitor ?-- PRBC for hgb </= 6.9gm%   ? - exceptions are ?  -  if ACS susepcted/confirmed then transfuse for hgb </= 8.0gm%,  or  ?  -  active bleeding with hemodynamic instability, then transfuse regardless of hemoglobin value ?  At at all times try to transfuse 1 unit prbc as possible with exception of active hemorrhage ? ? ?GIB ?Portal vein thrombosis ?-no acute indication for transfusion ?-cont ppi ?-per GI not a candidate for full dose a/c  ? ?Hypokalemia ?Hyponatremia ?Hypomag ? ?Plan ? - replet mag via IV ? - replete KCL ?- mnitor ? ? ? ?Hypoglycemia ?-monitor and replace as able ? ?Homelessness:  ?Polysubstance abuse ?Failure to thrie ?Severe protein calorie malnutrition ? ?-case management aware ?-psych also following for IVC ?- tube feeds ?- nutrition consult ? ?Vitamin D Deficiency, mild ?- will need supplementation as outpatient ? ?Best Practice (right click and "Reselect all SmartList Selections" daily)  ? ?Diet/type: tubefeeds ?DVT prophylaxis: other ortho recommended aspirin ?GI prophylaxis: PPI ?Lines: N/A ?Foley:  N/A ?Code Status:  full code ?Last date of multidisciplinary goals of care discussion [unable to reach family at this time. Pt is not oriented and at some  ? ?5/1 - daughters x 3 including [facility] [place] updated ? ? ? ?[place]  ? ?The patient Terri [name] is critically ill  with multiple organ systems failure and requires high complexity decision making for assessment and support, frequent evaluation and titration of therapies, application of advanced monitoring technologies and extensive interpretation of multiple databases.  ? ?Critical Care Time devoted to patient care services described in this note is  40  Minutes. This time reflects time of care of this signee Dr Brand Males. This critical care time does not reflect procedure time, or teaching time or supervisory time of PA/NP/Med student/Med Resident etc but could involve care discussion time  ? ? ? ?Dr. Brand Males, M.D., F.C.C.P ?Pulmonary and Critical Care Medicine ?Medical Director - [facility] ?Staff Physician, [place] ?[facility] ?Pager: [phone], If no answer or between  15:00h - 7:00h: call [phone] ? ?09/21/2021 ?10:37 AM ? ? ? ?LABS  ? ? ?PULMONARY ?Recent Labs  ?  Lab 09/18/21 ?0807 09/19/21 ?2032 09/19/21 ?2346 09/20/21 ?0342  ?PHART 7.44 7.31* 7.33* 7.44  ?PCO2ART 46 61* 57* 43  ?PO2ART 61* 107 169* 77*  ?HCO3 31.2* 29.7* 29.4* 29.2*  ?O2SAT 95.3 99 100 96.8  ? ? ?CBC ?Recent Labs  ?Lab 09/15/21 ?1641 09/18/21 ?0846  ?HGB 8.0* 9.0*  ?HCT 24.9* 28.0*  ?WBC 5.0 5.0  ?PLT 124* 179  ? ? ?COAGULATION ?Recent Labs  ?Lab 09/18/21 ?0846  ?INR 1.2  ? ? ?CARDIAC ? No results for input(s): TROPONINI in the last 168 hours. ?No results for input(s): PROBNP in the last 168 hours. ? ? ?CHEMISTRY ?Recent Labs  ?Lab 09/15/21 ?1641 09/18/21 ?7026 09/19/21 ?0239 09/20/21 ?0251 09/20/21 ?1649 09/21/21 ?3785  ?NA 133* 131*  --  132*  --  134*  ?K 3.5 3.2* 3.3* 4.7  --  3.4*  ?CL 103 97*  --  102  --  102  ?CO2 27 30  --  25  --  29  ?GLUCOSE 84 57*  --  61*  --  94  ?BUN 11 13  --  12  --  21*  ?CREATININE 0.45 0.53  --  0.53  --  0.51  ?CALCIUM 7.0* 7.0*  --  7.1*  --  7.3*  ?MG  --   --  1.7 2.1 1.8 1.8  ?PHOS  --   --  2.2* 4.1 3.8 3.6  ? ?Estimated Creatinine  Clearance: 90.9 mL/min (by C-G formula based on SCr of 0.51 mg/dL). ? ? ?LIVER ?Recent Labs  ?Lab 09/15/21 ?1641 09/18/21 ?0846  ?AST 17 29  ?ALT 7 <5  ?ALKPHOS 153* 138*  ?BILITOT 0.3 0.3  ?PROT 4.2* 4.6*  ?AL

## 2021-09-21 NOTE — Progress Notes (Signed)
Pharmacy Antibiotic Note ? ?SHAMARIA KAVAN is a 46 y.o. female admitted on 08/31/2021 with  worsening PNA  and now MRSA toe infections and likely MRSA PNA. Pharmacy had been consulted for Vanco ? ?Day 14 total abxs, D4 Vanc/Unasyn ?Tmax 100.4 this AM ?Renal function stable WNL ?Sputum Cx still pending ? ?4/13 fluconazole >> 4/30 (candidiasis on EGD)  ?4/18 vanc>>4/19 ?4/18 Ceftriaxone> 4/19  ?4/19 Ancef>> 4/28 ?4/24 Flagyl>> 4/28 ?4/28 Vanco>> ?4/28 Zosyn>> 4/28 ?4/28 Unasyn >>  ? ?4/17 liver abscess: staph aureus ( r to erythromycin ), bacteroides oralis, beta lactamase + ?4/18 BCx: neg ?4/23: Peritoneal fluid>>neg ?4/25: MRSA PCR + ?4/25: UCx: neg ?4/27: L foot abscess>>GPC - MRSA ?4/27: L great toes: MRSA ?4/27: BC x 2>>  ngtd ?4/30 sputum: pending ? ?Plan: ?Continue vancomycin 1000 mg IV Q 12 hrs for now with goal AUC 400-550. ?Unasyn 3g IV q6 ?ID to check vanc peak and trough today ? ? ?Height: '5\' 9"'$  (175.3 cm) ?Weight: 66.7 kg (147 lb 0.8 oz) ?IBW/kg (Calculated) : 66.2 ? ?Temp (24hrs), Avg:99.6 ?F (37.6 ?C), Min:98.2 ?F (36.8 ?C), Max:101.1 ?F (38.4 ?C) ? ?Recent Labs  ?Lab 09/15/21 ?1641 09/18/21 ?9147 09/18/21 ?1226 09/20/21 ?0251 09/21/21 ?8295  ?WBC 5.0 5.0  --   --   --   ?CREATININE 0.45 0.53  --  0.53 0.51  ?LATICACIDVEN  --  0.8 1.5  --   --   ? ?  ?Estimated Creatinine Clearance: 90.9 mL/min (by C-G formula based on SCr of 0.51 mg/dL).   ? ?Allergies  ?Allergen Reactions  ? Nsaids Other (See Comments)  ?  Stomach ulcer ?  ? Ketorolac Tromethamine Itching and Nausea And Vomiting  ? ? ?Adrian Saran, PharmD, BCPS ?Secure Chat if ?s ?09/21/2021 10:12 AM ? ? ?

## 2021-09-21 NOTE — Progress Notes (Signed)
Pharmacy Antibiotic Note ? ?CERINITY ZYNDA is a 46 y.o. female admitted on 08/31/2021 with disseminated staph infection - growing both MSSA and MRSA from cultures with recent concern for MRSA PNA. Pharmacy has been consulted for Vancomycin dosing. ? ?A Vancomycin peak this afternoon resulted as >60 mcg/ml which is higher than what is normally targeted. Will cancel doses and the trough and recheck a level with AM labs to monitor the trend.  ? ?Plan: ?- D/c standing Vancomycin orders and trough scheduled for this PM ?- Will obtain a VR with AM labs and re-dose as clinically indicated ? ?Height: '5\' 9"'$  (175.3 cm) ?Weight: 66.7 kg (147 lb 0.8 oz) ?IBW/kg (Calculated) : 66.2 ? ?Temp (24hrs), Avg:99.5 ?F (37.5 ?C), Min:98.2 ?F (36.8 ?C), Max:101.1 ?F (38.4 ?C) ? ?Recent Labs  ?Lab 09/15/21 ?1641 09/18/21 ?5277 09/18/21 ?1226 09/20/21 ?0251 09/21/21 ?8242 09/21/21 ?1402  ?WBC 5.0 5.0  --   --   --   --   ?CREATININE 0.45 0.53  --  0.53 0.51  --   ?LATICACIDVEN  --  0.8 1.5  --   --   --   ?VANCOPEAK  --   --   --   --   --  >60*  ?  ?Estimated Creatinine Clearance: 90.9 mL/min (by C-G formula based on SCr of 0.51 mg/dL).   ? ?Allergies  ?Allergen Reactions  ? Nsaids Other (See Comments)  ?  Stomach ulcer ?  ? Ketorolac Tromethamine Itching and Nausea And Vomiting  ? ? ?Antimicrobials this admission: ?Fluconazole 4/13 >> 4/30 ?CTX 4/18 x 1 ?Vancomycin 4/18 x 1; restart 4/27 ?Cefazolin 4/19 >> 4/28 ?Metronidazole 4/24 >> 4/28 ?Zosyn 4/28 x 1 ?Unasyn 4/28 >>  ? ?Dose adjustments this admission: ?N/a ? ?Microbiology results: ?4/17 Liver Abscess cx >> MSSA ?4/18 BCx >> ngF ?4/25 MRSA PCR >> positive  ?4/25 Peritoneal fluid >> ngx3d ? ?Thank you for allowing pharmacy to be a part of this patient?s care. ? ?Alycia Rossetti, PharmD, BCPS ?Infectious Diseases Clinical Pharmacist ?09/21/2021 3:40 PM  ? ?**Pharmacist phone directory can now be found on [email or web address] ([email or web address]).  Listed under [place].  ? ?

## 2021-09-21 NOTE — Progress Notes (Signed)
[facility]    Date of Admission:  08/31/2021   Total days of antibiotics 13        Day 4 vanco        Day 4 amp/sub        Day 13 fluc   ID: Terri [name] is a 46 y.o. 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, PV thrombus/gi bleed precluding anticoagulation, esophageal candidiasis and cirrhosis, MRSA osteomyelitis/pneumonia with respiratory distress requiring intubation Active Problems:   Hypokalemia   Polysubstance abuse (HCC)   Acute on chronic blood loss anemia   Multilobar pneumonia with acute hypoxic respiratory failure (HCC)   Portal vein thrombosis   Candida infection, esophageal (HCC)   Hepatic abscess   Swelling of left hand   Disseminated MSSA with liver abscess, left foot osteo and abscess, 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   Ascites   Exudative pleural effusion   Hypophosphatemia   Vaginal discharge   Acute respiratory failure with hypoxia (HCC)   Involuntary commitment   Protein-calorie malnutrition, severe    Subjective: Still having fevers up to 100.77F. remains intubated on [facility], found to have 5cm left groin abscess. No longer requiring vasopressors. Daughter at bedside   Medications:   chlorhexidine gluconate (MEDLINE KIT)  15 mL Mouth Rinse BID   Chlorhexidine Gluconate Cloth  6 each Topical Q0600   feeding supplement (PROSource TF)  45 mL Per Tube BID   folic acid  1 mg Per Tube Daily   guaiFENesin  5 mL Per Tube Q6H   levalbuterol  0.63 mg Nebulization Q6H   lidocaine  1 patch Transdermal Daily   magnesium oxide  400 mg Per Tube BID   mouth rinse  15 mL Mouth Rinse 10 times per day   midazolam  2 mg Intravenous Once   multivitamin  15 mL Per Tube Daily   mupirocin ointment  1 application. Nasal BID   nicotine  21 mg Transdermal Daily   nutrition supplement (JUVEN)  1 packet Per Tube BID WC   pantoprazole (PROTONIX) IV  40 mg Intravenous Q12H   sodium chloride flush  10-40 mL Intracatheter Q12H   sucralfate  1  g Per Tube TID WC & HS   vancomycin variable dose per unstable renal function (pharmacist dosing)   Does not apply See admin instructions   vitamin B-12  1,000 mcg Per Tube Daily    Objective: Vital signs in last 24 hours: Temp:  [98.2 F (36.8 C)-101.1 F (38.4 C)] 99.1 F (37.3 C) (05/01 1100) Pulse Rate:  [97-115] 99 (05/01 0306) Resp:  [12-21] 17 (05/01 0600) BP: (87-117)/(58-85) 117/83 (05/01 0600) SpO2:  [94 %-100 %] 95 % (05/01 1555) FiO2 (%):  [40 %-60 %] 60 % (05/01 1555)  Physical Exam  Constitutional: sedated; appears chronically ill  and cachetic. No distress.  HENT: [place]/AT, PERRLA, no scleral icterus Mouth/Throat: OETT in place Cardiovascular: tachycardic, regular rhythm and normal heart sounds. Exam reveals no gallop and no friction rub.  No murmur heard.  Pulmonary/Chest: Effort normal and breath sounds mild rhonchi Abdominal: Soft. Bowel sounds are decreased. exhibits no distension. There is no tenderness.  Lymphadenopathy: no cervical adenopathy. No axillary adenopathy Skin: Skin is warm and dry. No rash noted. No erythema. Left foot wrapped   Lab Results Recent Labs    09/20/21 0251 09/21/21 0529  NA 132* 134*  K 4.7 3.4*  CL 102 102  CO2 25 29  BUN 12 21*  CREATININE 0.53  0.51   Liver Panel No results for input(s): PROT, ALBUMIN, AST, ALT, ALKPHOS, BILITOT, BILIDIR, IBILI in the last 72 hours. Sedimentation Rate No results for input(s): ESRSEDRATE in the last 72 hours. C-Reactive Protein No results for input(s): CRP in the last 72 hours.  Microbiology: 4/30 trach culture pending 4/27 blood cx ngtd 4/27 toe tissue cx MRSA 4/17 hepatic abscess MSSA and bacteroides Studies/Results: DG Abd 1 View  Result Date: 09/20/2021 CLINICAL DATA:  NG tube placement EXAM: ABDOMEN - 1 VIEW COMPARISON:  09/20/2021 FINDINGS: Limited radiograph of the lower chest and upper abdomen was obtained for the purposes of enteric tube localization. Weighted enteric tube  is seen coursing below the diaphragm with distal tip terminating within the expected location of the gastric body. IMPRESSION: Enteric tube positioned within the gastric body. Electronically Signed   By: Duanne Guess D.O.   On: 09/20/2021 10:08   DG Abd 1 View  Result Date: 09/20/2021 CLINICAL DATA:  NG tube placement.  Post advancement. EXAM: ABDOMEN - 1 VIEW COMPARISON:  September 20, 2021. FINDINGS: RIGHT-sided effusion and basilar airspace disease as before. Gastric tube in place is slightly more proximal when compared to the prior study. Side port above GE junction. Limited assessment of bowel gas pattern is stable. EKG leads project over the upper abdomen and lower chest. On limited assessment there is no acute skeletal process. IMPRESSION: 1. Gastric tube is slightly more proximal when compared to the prior study. Side port above GE junction. This could be advanced based on tip position approximately 14 cm but should be correlated perhaps with radiograph of the chest or neck to ensure no substantial coiling or redundancy in these areas based on history of interval advancement. 2. RIGHT-sided effusion and basilar airspace disease as before. These results will be called to the ordering clinician or representative by the Radiologist Assistant, and communication documented in the PACS or [employer]. Electronically Signed   By: Donzetta Kohut M.D.   On: 09/20/2021 09:00   DG Abd 1 View  Result Date: 09/20/2021 CLINICAL DATA:  Check NG placement EXAM: ABDOMEN - 1 VIEW COMPARISON:  None. FINDINGS: Gastric catheter extends with the tip in the stomach although the proximal side port lies in the distal esophagus. This should be advanced several cm deeper into the stomach. Patchy infiltrates are again noted in the right lung. IMPRESSION: Gastric catheter as described. This should be advanced deeper into the stomach. Electronically Signed   By: Alcide Clever M.D.   On: 09/20/2021 01:59   DG CHEST PORT 1  VIEW  Result Date: 09/20/2021 CLINICAL DATA:  Status post intubation EXAM: PORTABLE CHEST 1 VIEW COMPARISON:  09/19/2021 FINDINGS: Cardiac shadow is stable. Endotracheal tube is noted in satisfactory position. Gastric catheter extends into the stomach although the proximal side port lies in the distal esophagus. This should be advanced several cm deeper into the stomach. Patchy airspace opacity is again identified throughout the right lung similar to that seen on prior exam and prior CT. Left lung remains clear. No bony abnormality is noted. IMPRESSION: Tubes and lines in satisfactory position. Persistent patchy infiltrate throughout the right lung. Electronically Signed   By: Alcide Clever M.D.   On: 09/20/2021 01:58   DG CHEST PORT 1 VIEW  Result Date: 09/19/2021 CLINICAL DATA:  Dyspnea. EXAM: PORTABLE CHEST 1 VIEW COMPARISON:  Radiograph and CT yesterday FINDINGS: Extensive multifocal right lung opacities with slight worsening aeration at the right lung base. This may be due to worsening  airspace disease are increasing pleural effusion. The left lung airspace disease was better appreciated on yesterday's CT. Left pleural effusion is similar by radiograph. No pneumothorax. IMPRESSION: 1. Extensive multifocal right lung opacities with slight worsening aeration at the right lung base. This may be due to worsening airspace disease or increasing pleural effusion. 2. Left pleural effusion is similar. Left lung opacities on CT are not well demonstrated. Electronically Signed   By: Narda Rutherford M.D.   On: 09/19/2021 20:48   VAS [place] LOWER EXTREMITY VENOUS (DVT)  Result Date: 09/20/2021  Lower Venous DVT Study Patient Name:  Terri [name]  Date of Exam:   09/20/2021 Medical Rec #: [id number]      Accession #:    [id number] Date of Birth: [birth year]      Patient Gender: F Patient Age:   40 years Exam Location:  [facility] Procedure:      VAS [place] LOWER EXTREMITY VENOUS (DVT) Referring Phys: JESSICA MARSHALL  --------------------------------------------------------------------------------  Indications: Swelling, and Edema.  Comparison Study: 09/13/21 prior Performing Technologist: Argentina Ponder RVS  Examination Guidelines: A complete evaluation includes B-mode imaging, spectral Doppler, color Doppler, and power Doppler as needed of all accessible portions of each vessel. Bilateral testing is considered an integral part of a complete examination. Limited examinations for reoccurring indications may be performed as noted. The reflux portion of the exam is performed with the patient in reverse Trendelenburg.  +---------+---------------+---------+-----------+----------+--------------+ RIGHT    CompressibilityPhasicitySpontaneityPropertiesThrombus Aging +---------+---------------+---------+-----------+----------+--------------+ CFV      Full           Yes      Yes                                 +---------+---------------+---------+-----------+----------+--------------+ SFJ      Full                                                        +---------+---------------+---------+-----------+----------+--------------+ FV Prox  Full                                                        +---------+---------------+---------+-----------+----------+--------------+ FV Mid   Full                                                        +---------+---------------+---------+-----------+----------+--------------+ FV DistalFull                                                        +---------+---------------+---------+-----------+----------+--------------+ PFV      Full                                                        +---------+---------------+---------+-----------+----------+--------------+  POP      Full           Yes      Yes                                 +---------+---------------+---------+-----------+----------+--------------+ PTV      Full                                                         +---------+---------------+---------+-----------+----------+--------------+ PERO     Full                                                        +---------+---------------+---------+-----------+----------+--------------+   +---------+---------------+---------+-----------+----------+--------------+ LEFT     CompressibilityPhasicitySpontaneityPropertiesThrombus Aging +---------+---------------+---------+-----------+----------+--------------+ CFV      Full           Yes      Yes                                 +---------+---------------+---------+-----------+----------+--------------+ SFJ      Full                                                        +---------+---------------+---------+-----------+----------+--------------+ FV Prox  Full                                                        +---------+---------------+---------+-----------+----------+--------------+ FV Mid   Full                                                        +---------+---------------+---------+-----------+----------+--------------+ FV DistalFull                                                        +---------+---------------+---------+-----------+----------+--------------+ PFV      Full                                                        +---------+---------------+---------+-----------+----------+--------------+ POP      Full           Yes      Yes                                 +---------+---------------+---------+-----------+----------+--------------+  PTV      Full                                                        +---------+---------------+---------+-----------+----------+--------------+ PERO     Full                                                        +---------+---------------+---------+-----------+----------+--------------+     Summary: BILATERAL: - No evidence of deep vein thrombosis seen in the lower extremities,  bilaterally. -No evidence of popliteal cyst, bilaterally.  LEFT: - Incidental finding: Large absess vs hematoma measuring 5.99cm x 4.21cm noted in the left groin. Etiology unknown. Further testing may be warranted.  *See table(s) above for measurements and observations. Electronically signed by Gerarda Fraction on 09/20/2021 at 3:15:42 PM.    Final    [place] EKG SITE RITE  Result Date: 09/20/2021 If Site Rite image not attached, placement could not be confirmed due to current cardiac rhythm.    Assessment/Plan: MSSA/MRSA disseminated disease with hepatic abscess, gangrenous great toe osteo, MRSA pneumonia = continue on current regimen of vanco, amp/sub.   Supratherapeutic vanco level = will see how quickly she clears and likely change to different agent, possibly ceftaroline   Fevers = concern for untreated source, recommend eval for 5cm groin fluid collection to be aspirated  MRSA pneumonia with respiratory failure = still requiring intubation  Severe protein calorie malnutrition = will need tube feeds  Eso candidiasis = on fluconazole 200mg  daily  Overall condition is guarded  [facility] Pager: [phone]  09/21/2021, 4:05 PM

## 2021-09-22 ENCOUNTER — Inpatient Hospital Stay (HOSPITAL_COMMUNITY): Payer: Medicaid Other

## 2021-09-22 DIAGNOSIS — K922 Gastrointestinal hemorrhage, unspecified: Secondary | ICD-10-CM | POA: Diagnosis not present

## 2021-09-22 DIAGNOSIS — J9601 Acute respiratory failure with hypoxia: Secondary | ICD-10-CM | POA: Diagnosis not present

## 2021-09-22 LAB — GLUCOSE, CAPILLARY
Glucose-Capillary: 117 mg/dL — ABNORMAL HIGH (ref 70–99)
Glucose-Capillary: 109 mg/dL — ABNORMAL HIGH (ref 70–99)
Glucose-Capillary: 111 mg/dL — ABNORMAL HIGH (ref 70–99)
Glucose-Capillary: 131 mg/dL — ABNORMAL HIGH (ref 70–99)
Glucose-Capillary: 134 mg/dL — ABNORMAL HIGH (ref 70–99)
Glucose-Capillary: 139 mg/dL — ABNORMAL HIGH (ref 70–99)

## 2021-09-22 LAB — BLOOD GAS, ARTERIAL
Acid-Base Excess: 5.1 mmol/L — ABNORMAL HIGH (ref 0.0–2.0)
Bicarbonate: 30.5 mmol/L — ABNORMAL HIGH (ref 20.0–28.0)
O2 Saturation: 99.3 %
Patient temperature: 37
pCO2 arterial: 47 mmHg (ref 32–48)
pH, Arterial: 7.42 (ref 7.35–7.45)
pO2, Arterial: 70 mmHg — ABNORMAL LOW (ref 83–108)

## 2021-09-22 LAB — CULTURE, RESPIRATORY W GRAM STAIN: Culture: NO GROWTH

## 2021-09-22 LAB — CBC
HCT: 20.6 % — ABNORMAL LOW (ref 36.0–46.0)
Hemoglobin: 6.7 g/dL — CL (ref 12.0–15.0)
MCH: 25.7 pg — ABNORMAL LOW (ref 26.0–34.0)
MCHC: 32.5 g/dL (ref 30.0–36.0)
MCV: 78.9 fL — ABNORMAL LOW (ref 80.0–100.0)
Platelets: 85 10*3/uL — ABNORMAL LOW (ref 150–400)
RBC: 2.61 MIL/uL — ABNORMAL LOW (ref 3.87–5.11)
RDW: 25.2 % — ABNORMAL HIGH (ref 11.5–15.5)
WBC: 5.5 10*3/uL (ref 4.0–10.5)
nRBC: 0 % (ref 0.0–0.2)

## 2021-09-22 LAB — MAGNESIUM: Magnesium: 2.1 mg/dL (ref 1.7–2.4)

## 2021-09-22 LAB — AEROBIC/ANAEROBIC CULTURE W GRAM STAIN (SURGICAL/DEEP WOUND): Gram Stain: NONE SEEN

## 2021-09-22 LAB — PHOSPHORUS: Phosphorus: 3.3 mg/dL (ref 2.5–4.6)

## 2021-09-22 LAB — VANCOMYCIN, RANDOM: Vancomycin Rm: 24

## 2021-09-22 LAB — PREPARE RBC (CROSSMATCH)

## 2021-09-22 MED ORDER — CHLORHEXIDINE GLUCONATE CLOTH 2 % EX PADS
6.0000 | MEDICATED_PAD | Freq: Every day | CUTANEOUS | Status: DC
  Administered 2021-09-22 – 2021-10-12 (×21): 6 via TOPICAL

## 2021-09-22 MED ORDER — LOPERAMIDE HCL 1 MG/7.5ML PO SUSP
2.0000 mg | ORAL | Status: DC | PRN
  Filled 2021-09-22: qty 15

## 2021-09-22 MED ORDER — METRONIDAZOLE 500 MG PO TABS
500.0000 mg | ORAL_TABLET | Freq: Two times a day (BID) | ORAL | Status: DC
  Administered 2021-09-22: 500 mg via ORAL
  Filled 2021-09-22: qty 1

## 2021-09-22 MED ORDER — SODIUM CHLORIDE 0.9% IV SOLUTION: Freq: Once | INTRAVENOUS | Status: AC

## 2021-09-22 MED ORDER — METRONIDAZOLE 500 MG PO TABS
500.0000 mg | ORAL_TABLET | Freq: Two times a day (BID) | ORAL | Status: DC
  Administered 2021-09-22 – 2021-09-26 (×9): 500 mg
  Filled 2021-09-22 (×10): qty 1

## 2021-09-22 MED ORDER — SODIUM CHLORIDE 0.9 % IV SOLN
600.0000 mg | Freq: Three times a day (TID) | INTRAVENOUS | Status: DC
  Administered 2021-09-22 – 2021-09-28 (×18): 600 mg via INTRAVENOUS
  Filled 2021-09-22 (×26): qty 20

## 2021-09-22 NOTE — Progress Notes (Signed)
?  [facility]   ? ?Date of Admission:  08/31/2021    ? ?ID: Terri [name] is a 46 y.o. female with   ?Active Problems: ?  Hypokalemia ?  Polysubstance abuse ([place]) ?  Acute on chronic blood loss anemia ?  Multilobar pneumonia with acute hypoxic respiratory failure ([place]) ?  Portal vein thrombosis ?  Candida infection, esophageal ([place]) ?  Hepatic abscess ?  Swelling of left hand ?  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  Ascites ?  Exudative pleural effusion ?  Hypophosphatemia ?  Vaginal discharge ?  Acute respiratory failure with hypoxia ([place]) ?  Involuntary commitment ?  Protein-calorie malnutrition, severe ? ? ? ?Subjective: ?Afebrile, remains intubated. Did have trial of PS but now back on [facility] ? ?Medications:  ? chlorhexidine gluconate (MEDLINE KIT)  15 mL Mouth Rinse BID  ? Chlorhexidine Gluconate Cloth  6 each Topical Daily  ? feeding supplement (PROSource TF)  45 mL Per Tube BID  ? folic acid  1 mg Per Tube Daily  ? guaiFENesin  5 mL Per Tube Q6H  ? levalbuterol  0.63 mg Nebulization Q6H  ? lidocaine  1 patch Transdermal Daily  ? magnesium oxide  400 mg Per Tube BID  ? mouth rinse  15 mL Mouth Rinse 10 times per day  ? metroNIDAZOLE  500 mg Oral Q12H  ? midazolam  2 mg Intravenous Once  ? multivitamin  15 mL Per Tube Daily  ? nicotine  21 mg Transdermal Daily  ? nutrition supplement (JUVEN)  1 packet Per Tube BID WC  ? pantoprazole (PROTONIX) IV  40 mg Intravenous Q12H  ? sodium chloride flush  10-40 mL Intracatheter Q12H  ? sucralfate  1 g Per Tube TID WC & HS  ? vitamin B-12  1,000 mcg Per Tube Daily  ? ? ?Objective: ?Vital signs in last 24 hours: ?Temp:  [98.1 ?F (36.7 ?C)-99.7 ?F (37.6 ?C)] 99 ?F (37.2 ?C) (05/02 1350) ?Pulse Rate:  [87-133] 99 (05/02 1350) ?Resp:  [12-34] 20 (05/02 1350) ?BP: (83-126)/(55-104) 101/69 (05/02 1350) ?SpO2:  [84 %-100 %] 98 % (05/02 1354) ?FiO2 (%):  [40 %-60 %] 40 % (05/02 1354) ?Weight:   [71.8 kg] 71.8 kg (05/02 0416) ?Physical Exam  ?Constitutional:  intubated, cachetic, older than stated age and mal-nourished.  ?HENT: [place]/AT, PERRLA, no scleral icterus ?Mouth/Throat: OETT in palce ?Cardiovascular: tachy, regular rhythm and normal heart sounds. Exam reveals no gallop and no friction rub.  ?No murmur heard.  ?Pulmonary/Chest: Effort normal and breath sounds shows mild rhonchi ?Abdominal: Soft. Bowel sounds are decreased. exhibits no distension. There is no tenderness.  ?Ext: left great toe amputation/foot wrapped, no erythema. UE edema of hands bilaterally ?Neurological: alert and oriented to person, place, and time.  ?Skin: Skin is warm and dry. No rash noted. No erythema.  ?Psychiatric: a normal mood and affect.  behavior is normal.  ? ? ?Lab Results ?Recent Labs  ?  09/20/21 ?0251 09/21/21 ?5697 09/22/21 ?1055  ?WBC  --   --  5.5  ?HGB  --   --  6.7*  ?HCT  --   --  20.6*  ?NA 132* 134*  --   ?K 4.7 3.4*  --   ?CL 102 102  --   ?CO2 25 29  --   ?BUN 12 21*  --   ?CREATININE 0.53 0.51  --   ? ?Liver Panel ?No results  for input(s): PROT, ALBUMIN, AST, ALT, ALKPHOS, BILITOT, BILIDIR, IBILI in the last 72 hours. ?Sedimentation Rate ?No results for input(s): ESRSEDRATE in the last 72 hours. ?C-Reactive Protein ?No results for input(s): CRP in the last 72 hours. ? ?Microbiology: ?reviewed ?Studies/Results: ?DG Abd 1 View ? ?Result Date: 09/22/2021 ?CLINICAL DATA:  NG tube placement EXAM: ABDOMEN - 1 VIEW COMPARISON:  09/20/2021 FINDINGS: Dobbhoff tube tip located in the region of the proximal stomach, similar to prior study. Dense airspace opacity again seen in the right medial lung. No dilated loops of bowel identified within the visualized abdomen. IMPRESSION: Tip of feeding tube located in the region of the proximal stomach, similar to prior study. Electronically Signed   By: Miachel Roux M.D.   On: 09/22/2021 08:03  ? ?[place] LT LOWER EXTREM LTD SOFT TISSUE NON VASCULAR ? ?Result Date: 09/21/2021 ?CLINICAL  DATA:  Evaluate for left groin abscess. EXAM: ULTRASOUND LEFT GROIN AREA LIMITED TECHNIQUE: Ultrasound examination of the lower extremity (left groin) soft tissues was performed in the area of clinical concern. COMPARISON:  None. FINDINGS: Joint Space: Not evaluated. Muscles: Not evaluated. Tendons: Not evaluated. Other Soft Tissue Structures: In the left groin region, no fluid collection or mass is seen. Limited assessment of the midline pelvis and left and right lower quadrants of the abdomen demonstrates moderate free ascites. Largest measured fluid pocket in the right lower quadrant is 8 cm in depth. IMPRESSION: 1. No left groin abscess or mass is seen. 2. Moderate free ascites, greatest depth of fluid right lower quadrant is 8 cm. Electronically Signed   By: Telford Nab M.D.   On: 09/21/2021 20:07   ? ? ?Assessment/Plan: ?Eso candidiasis = finished course of fluconazole ? ?MRSA pneumonia/ great toe osteo s/p amputation = now therapeutic on vancomycin. Will plan to change to ceftaroline in stead of vanco. ? ?Polymicrobial hepatic abscess = discontinue with amp/sub and change to ceftaroline and metronidazole which would cover MSSA/bacteroides ? ?Severe protein-calorie malnutrition = continue with tube feeds.  ? ?Overall condition still guarded ? ?Terri [name] ?[facility] ?Pager: [phone] ? ?09/22/2021, 2:11 PM ? ? ? ? ? ?

## 2021-09-22 NOTE — TOC Progression Note (Signed)
Transition of Care (TOC) - Progression Note  ? ? ?Patient Details  ?Name: Terri [name] ?MRN: [id number] ?Date of Birth: 1975/07/28 ? ?Transition of Care (TOC) CM/SW Contact  ?Ross Ludwig, LCSW ?Phone Number: ?09/22/2021, 5:34 PM ? ?Clinical Narrative:    ? ?CSW continuing to follow patient's progress throughout discharge planning.  Patient IVC is valid till Friday May 5. ? ?Expected Discharge Plan: Home/Self Care ?Barriers to Discharge: Continued Medical Work up ? ?Expected Discharge Plan and Services ?Expected Discharge Plan: Home/Self Care ?  ?Discharge Planning Services: CM Consult ?  ?Living arrangements for the past 2 months: No permanent address ?                ?  ?  ?  ?  ?  ?  ?  ?  ?  ?  ? ? ?Social Determinants of Health (SDOH) Interventions ?  ? ?Readmission Risk Interventions ? ?  09/02/2021  ?  2:25 PM  ?Readmission Risk Prevention Plan  ?Transportation Screening Complete  ?Medication Review Press photographer) Complete  ?PCP or Specialist appointment within 3-5 days of discharge Complete  ?Palliative Care Screening Not Applicable  ?[place] Not Applicable  ? ? ?

## 2021-09-22 NOTE — Progress Notes (Addendum)
? ?NAME:  Terri [name], MRN:  [id number], DOB:  12-Feb-1976, LOS: 21 ?ADMISSION DATE:  08/31/2021, CONSULTATION DATE:  09/19/21 ?REFERRING MD:  E link doctor, CHIEF COMPLAINT:  dark colored stool   ? ?BRIEF  ?46 yo female with pmh homelessness, recurrent gib, chronic microcytic anemia, htn, polysubstance abuse, roux en y presented to [facility] with reports of dark colored stools. Pt has history of chronic gib 2/2 peptic ulcers. She underwent endoscopy with large anastomotic ulceration noted and started on ppi. Hgb stabliized.  ? ?She was then found to have mssa bacteremia that appears to be disseminated. Her L great toe was noted to be gangrenous vascular surgery and orthopedics determined amputation would be best route for pt and this was done on 4/26. Cx from this was MRSA and she was broadened to vancomycin. She has multiple liver abscess noted and a history of portal v thrombosis that is reportedly stable. She has not had any a/c 2/2 GIB's.  ? ?She is being treated for 6 weeks of iv abx. Over the past two - three days pt has req increasing oxygen support ultimately req transfer to ICU. She was placed on bipap overnight and ccm was then consulted for evaluation for possible intubation.  ? ?All history is obtained from chart and RN as pt is not oriented for any discussion.  ? ?Pertinent  Medical History  ?Portal v thrombosis, polysubstance abuse, chronic anemia,  ? ?Significant Hospital Events: ?Including procedures, antibiotic start and stop dates in addition to other pertinent events   ?4/10 admitted ?4/13 egd for gib ?4/15 vascular surgery consulted for gangrenous L toe ?4/18 ID consulted for mssa infection, liver abscess, osteo, mrsa pneumonia ?4/23 Thoracentesis, culture negative, exudative ?4/26: ortho consulted for amputation of L great toe ?4/27 psych consulted for capacity, pt was placed on ivc ?4/28: transferred to ICU for increasing oxygen requirement ?4/29: ccm consulted  ?4/30 - Patient intubated  ?Palliative  care consult - full code ? ?Interim History / Subjective:  ? ?Tolerating vent wean this morning until she needed to be cleaned then became quite tachypneic and desaturated. Remains on propofol and fentanyl.  ? ?Objective   ?Blood pressure 106/83, pulse (!) 101, temperature 99.7 ?F (37.6 ?C), resp. rate 13, height '5\' 9"'$  (1.753 m), weight 71.8 kg, SpO2 97 %. ?   ?Vent Mode: PSV;CPAP ?FiO2 (%):  [40 %-60 %] 40 % ?Set Rate:  [18 bmp] 18 bmp ?Vt Set:  [530 mL] 530 mL ?PEEP:  [8 cmH20] 8 cmH20 ?Pressure Support:  [5 cmH20] 5 cmH20 ?Plateau Pressure:  [14 cmH20-18 cmH20] 15 cmH20  ? ?Intake/Output Summary (Last 24 hours) at 09/22/2021 1004 ?Last data filed at 09/22/2021 7209 ?Gross per 24 hour  ?Intake 2735.73 ml  ?Output 600 ml  ?Net 2135.73 ml  ? ? ?Filed Weights  ? 09/18/21 0342 09/18/21 1742 09/22/21 0416  ?Weight: 63 kg 66.7 kg 71.8 kg  ? ? ?Examination: ?General:  Frail acutely and critically ill appearing female. Appears older than stated age ?Neuro:  Fluctuating periods of agitation and sedation.  ?HEENT:  [place]/AT, PERRL, no JVD ?Cardiovascular:  Tachy, regular, no MRG ?Lungs:  Clear bilateral breath sounds.  ?Abdomen:  Soft, non-distended, non-tender ?Musculoskeletal:  No acute deformity. Moving all extremities ?Skin:  Intact, MMM ? ? ? ?Resolved Hospital Problem list   ? ? ?Assessment & Plan:  ?Acute hypoxic hypercarbic resp failure - negative duplex LE 4/30. Intubated 09/20/21 ?MRSA pneumonia ?Bilateral Pleural Effusions, R > L -  Right thoracentesis  performed 4/23, exudative based on pleural fluid studies, culture negative ?- Continue full vent support ?- Continue Vancomycin for MRSA pneumonia ?- Defer draining residual effusion, quite small on bedside echo ?- Daily SBT/WUA ? 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 ?L groin possible abscess vs mass > 5cm v mass Left groin on Duplex LE 09/20/21. Not seen on non-vascular ultrasound of L  groin 5/1. Ascites seen.  ? - Monitor clinically ? ?Septic Shock - - due to the above ?- goal MAP 65 or greater ?- Off pressors since 5/1. DC from [facility] ? ?Chronic microcytic anemia ?- Monitor ?-- PRBC for hgb </= 6.9gm%   ? - exceptions are ?  -  if ACS susepcted/confirmed then transfuse for hgb </= 8.0gm%,  or  ? -  active bleeding with hemodynamic instability, then transfuse regardless of hemoglobin value ?  At at all times try to transfuse 1 unit prbc as possible with exception of active hemorrhage ? ?GIB ?Portal vein thrombosis ?-no acute indication for transfusion ?-cont ppi > 5/20, carafate > 5/4 ?-per GI not a candidate for full dose a/c  ?- hemoglobin 6.7 today. All cell lines down. Tranfuse one unit PRBC. No evidence of active bleed.  ? ?Hypokalemia ?Hyponatremia ?Hypomag ?- am labs pending ? ?Hypoglycemia ?-monitor and replace as able ? ?Homelessness:  ?Polysubstance abuse ?Failure to thrie ?Severe protein calorie malnutrition ?- case management aware ?- psych also following for IVC ?- tube feeds ?- nutrition consult ? ?Vitamin D Deficiency, mild ?- will need supplementation as outpatient ? ?Best Practice (right click and "Reselect all SmartList Selections" daily)  ? ?Diet/type: tubefeeds ?DVT prophylaxis: other ortho recommended aspirin ?GI prophylaxis: PPI ?Lines: N/A ?Foley:  N/A ?Code Status:  full code ?Last date of multidisciplinary goals of care discussion:  ? ? ?[place]  ? ?Critical care time: 46 minutes ? ?Georgann Housekeeper, AGACNP-BC ?[facility] ? ?See Amion for personal pager ?PCCM on call pager [phone] until 7pm. ?Please call Elink 7p-7a. [phone] ? ?09/22/2021 10:25 AM ? ?

## 2021-09-23 ENCOUNTER — Inpatient Hospital Stay (HOSPITAL_COMMUNITY): Payer: Medicaid Other

## 2021-09-23 DIAGNOSIS — Z7189 Other specified counseling: Secondary | ICD-10-CM

## 2021-09-23 DIAGNOSIS — Z515 Encounter for palliative care: Secondary | ICD-10-CM

## 2021-09-23 DIAGNOSIS — J9601 Acute respiratory failure with hypoxia: Secondary | ICD-10-CM | POA: Diagnosis not present

## 2021-09-23 DIAGNOSIS — K922 Gastrointestinal hemorrhage, unspecified: Secondary | ICD-10-CM | POA: Diagnosis not present

## 2021-09-23 LAB — GLUCOSE, CAPILLARY
Glucose-Capillary: 105 mg/dL — ABNORMAL HIGH (ref 70–99)
Glucose-Capillary: 127 mg/dL — ABNORMAL HIGH (ref 70–99)
Glucose-Capillary: 92 mg/dL (ref 70–99)
Glucose-Capillary: 105 mg/dL — ABNORMAL HIGH (ref 70–99)
Glucose-Capillary: 104 mg/dL — ABNORMAL HIGH (ref 70–99)

## 2021-09-23 LAB — COMPREHENSIVE METABOLIC PANEL
ALT: 11 U/L (ref 0–44)
AST: 48 U/L — ABNORMAL HIGH (ref 15–41)
Albumin: <1.5 g/dL — ABNORMAL LOW (ref 3.5–5.0)
Alkaline Phosphatase: 122 U/L (ref 38–126)
Anion gap: 4 — ABNORMAL LOW (ref 5–15)
BUN: 23 mg/dL — ABNORMAL HIGH (ref 6–20)
CO2: 30 mmol/L (ref 22–32)
Calcium: 7 mg/dL — ABNORMAL LOW (ref 8.9–10.3)
Chloride: 104 mmol/L (ref 98–111)
Creatinine, Ser: 0.35 mg/dL — ABNORMAL LOW (ref 0.44–1.00)
GFR, Estimated: >60 mL/min (ref >60)
Glucose, Bld: 116 mg/dL — ABNORMAL HIGH (ref 70–99)
Potassium: 3.2 mmol/L — ABNORMAL LOW (ref 3.5–5.1)
Sodium: 138 mmol/L (ref 135–145)
Total Bilirubin: 0.4 mg/dL (ref 0.3–1.2)
Total Protein: 4.3 g/dL — ABNORMAL LOW (ref 6.5–8.1)

## 2021-09-23 LAB — HEMOGLOBIN AND HEMATOCRIT, BLOOD
HCT: 21.8 % — ABNORMAL LOW (ref 36.0–46.0)
Hemoglobin: 7.5 g/dL — ABNORMAL LOW (ref 12.0–15.0)

## 2021-09-23 LAB — CBC
HCT: 21.1 % — ABNORMAL LOW (ref 36.0–46.0)
Hemoglobin: 6.8 g/dL — CL (ref 12.0–15.0)
MCH: 25.9 pg — ABNORMAL LOW (ref 26.0–34.0)
MCHC: 32.2 g/dL (ref 30.0–36.0)
MCV: 80.2 fL (ref 80.0–100.0)
Platelets: 63 10*3/uL — ABNORMAL LOW (ref 150–400)
RBC: 2.63 MIL/uL — ABNORMAL LOW (ref 3.87–5.11)
RDW: 22.5 % — ABNORMAL HIGH (ref 11.5–15.5)
WBC: 4.5 10*3/uL (ref 4.0–10.5)
nRBC: 0 % (ref 0.0–0.2)

## 2021-09-23 LAB — PREPARE RBC (CROSSMATCH)

## 2021-09-23 LAB — MAGNESIUM: Magnesium: 2 mg/dL (ref 1.7–2.4)

## 2021-09-23 LAB — PHOSPHORUS: Phosphorus: 2.5 mg/dL (ref 2.5–4.6)

## 2021-09-23 MED ORDER — POTASSIUM CHLORIDE 20 MEQ PO PACK
40.0000 meq | PACK | Freq: Every day | ORAL | Status: DC
  Administered 2021-09-24 – 2021-09-26 (×3): 40 meq
  Filled 2021-09-23 (×4): qty 2

## 2021-09-23 MED ORDER — POTASSIUM CHLORIDE 10 MEQ/50ML IV SOLN
10.0000 meq | INTRAVENOUS | Status: DC
  Administered 2021-09-23 (×4): 10 meq via INTRAVENOUS
  Filled 2021-09-23 (×4): qty 50

## 2021-09-23 MED ORDER — SODIUM CHLORIDE 0.9% IV SOLUTION: Freq: Once | INTRAVENOUS | Status: AC

## 2021-09-23 MED ORDER — POTASSIUM CHLORIDE 20 MEQ PO PACK
20.0000 meq | PACK | ORAL | Status: AC
  Administered 2021-09-23 (×2): 20 meq
  Filled 2021-09-23 (×2): qty 1

## 2021-09-23 MED ORDER — SODIUM CHLORIDE 0.9 % IV SOLN: INTRAVENOUS | Status: DC

## 2021-09-23 MED ORDER — MIDAZOLAM HCL 2 MG/2ML IJ SOLN
1.0000 mg | INTRAMUSCULAR | Status: DC | PRN
  Administered 2021-09-23: 2 mg via INTRAVENOUS
  Filled 2021-09-23: qty 2

## 2021-09-23 NOTE — Progress Notes (Signed)
? ?NAME:  Terri [name], MRN:  [id number], DOB:  1975/12/24, LOS: 4 ?ADMISSION DATE:  08/31/2021, CONSULTATION DATE:  09/19/21 ?REFERRING MD:  E link doctor, CHIEF COMPLAINT:  dark colored stool   ? ?BRIEF  ?46 yo female with pmh homelessness, recurrent gib, chronic microcytic anemia, htn, polysubstance abuse, roux en y presented to [facility] with reports of dark colored stools. Pt has history of chronic gib 2/2 peptic ulcers. She underwent endoscopy with large anastomotic ulceration noted and started on ppi. Hgb stabliized.  ? ?She was then found to have mssa bacteremia that appears to be disseminated. Her L great toe was noted to be gangrenous vascular surgery and orthopedics determined amputation would be best route for pt and this was done on 4/26. Cx from this was MRSA and she was broadened to vancomycin. She has multiple liver abscess noted and a history of portal v thrombosis that is reportedly stable. She has not had any a/c 2/2 GIB's.  ? ?She is being treated for 6 weeks of iv abx. Over the past two - three days pt has req increasing oxygen support ultimately req transfer to ICU. She was placed on bipap overnight and ccm was then consulted for evaluation for possible intubation.  ? ?All history is obtained from chart and RN as pt is not oriented for any discussion.  ? ?Pertinent  Medical History  ?Portal v thrombosis, polysubstance abuse, chronic anemia,  ? ?Significant Hospital Events: ?Including procedures, antibiotic start and stop dates in addition to other pertinent events   ?4/10 admitted ?4/13 egd for gib ?4/15 vascular surgery consulted for gangrenous L toe ?4/18 ID consulted for mssa infection, liver abscess, osteo, mrsa pneumonia ?4/23 Thoracentesis, culture negative, exudative ?4/26: ortho consulted for amputation of L great toe ?4/27 psych consulted for capacity, pt was placed on ivc ?4/28: transferred to ICU for increasing oxygen requirement ?4/29: ccm consulted  ?4/30 - Patient intubated  ?Palliative  care consult - full code ?5/1 - [place] groin left - no abscess, Moderate Ascites + ?5/2 - Faild SBT.  Remains on propofol and fentanyl.  ?S/p 1 unit probc ?Pall care consult ordeed ?ID consult - noted ?Eso candidadis - finished diflucan ?MRSA pna, great toe osteo s/p amputation - change vanco to teflaro ?Polymicrobial hepatic abscess -> change unasyn to teflaro ? ?Interim History / Subjective:  ? ?09/23/21:  fever curve better. On vent Fio2 30%, Diprivan gtt, Fent gtt. Hgb again < 7gm% and 1 unit prbc given. On vent. Ex boyrfriend Fritz Pickerel at bedside ? ?Objective   ?Blood pressure 113/81, pulse 96, temperature 98.2 ?F (36.8 ?C), resp. rate 18, height '5\' 9"'$  (1.753 m), weight 73 kg, SpO2 93 %. ?   ?Vent Mode: PRVC ?FiO2 (%):  [30 %-50 %] 30 % ?Set Rate:  [18 bmp] 18 bmp ?Vt Set:  [530 mL] 530 mL ?PEEP:  [5 cmH20-8 cmH20] 5 cmH20 ?Plateau Pressure:  [13 cmH20-22 cmH20] 13 cmH20  ? ?Intake/Output Summary (Last 24 hours) at 09/23/2021 0950 ?Last data filed at 09/23/2021 6378 ?Gross per 24 hour  ?Intake 3428.18 ml  ?Output 2250 ml  ?Net 1178.18 ml  ? ?Filed Weights  ? 09/18/21 1742 09/22/21 0416 09/23/21 0259  ?Weight: 66.7 kg 71.8 kg 73 kg  ? ? ?Examination: ?General Appearance:  Looks criticall ill  ?Head:  Normocephalic, without obvious abnormality, atraumatic ?Eyes:  PERRL - yes, conjunctiva/corneas - mudd     ?Ears:  Normal external ear canals, both ears ?Nose:  G tube - no ?Throat:  ETT TUBE - yes , OG tube - yes ?Neck:  Supple,  No enlargement/tenderness/nodules ?Lungs: Clear to auscultation bilaterally, Ventilator   Synchrony - yes ?Heart:  S1 and S2 normal, no murmur, CVP - no.  Pressors - no ?Abdomen:  Soft, no masses, no organomegaly ?Genitalia / Rectal:  Not done ?Extremities:  Extremities- intact ?Skin:  ntact in exposed areas . Sacral area - not examined ?Neurologic:  Sedation - yes gtt -> RASS - -3 . Moves all 4s - weak. CAM-ICU - cannot test . Orientation - no ? ? ? ? ? ? ?Resolved Hospital Problem list   ?Septic Shock  - - due to the above. - Off pressors since 5/1.  ? ?Assessment & Plan:  ?Acute hypoxic hypercarbic resp failure  (negative duplex LE 4/30. Intubated 09/20/21) due to MRSA pneumonia ?Bilateral Pleural Effusions, R > L -  Right thoracentesis performed 4/23, exudative based on pleural fluid studies, culture negative ? ? ?09/23/2021 - > does not meet criteria for SBT/Extubation in setting of Acute Respiratory Failure due to encephalopathy, cachexia and sepsis ? ?Plan ?- SBT as tolerated ?- Continue full vent support ?- Continue Vancomycin for MRSA pneumonia ?- Monitor resiual R pleural effusion - drain if grows/indiacated ?- Daily SBT/WUA ? 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 ?5/3 - fever reducing after change to Teflaro ? ?Plan ?- Teflaro since 09/23/21 ? ? ?L groin possible abscess vs mass > 5cm v mass Left groin on Duplex LE 09/20/21. Not seen on non-vascular ultrasound of L groin 5/1.  ? ?Plan ? - Monitor clinically ? ? ?[place] Portal Vein thrombosis ?Chronic microcytic anemia  - baseline hgb 9-10.5gm% ?Admitted with GI bleeed - hgb 4.6gm% ? ?5/2 - hgb drif < 7 and PRBC ?5/3: hgb again <7 and PRBC. No overt bleeding. [place] with ascites ? ?Plan ?- Get RUQ  [place] -> might need paracentesis ? ?-- PRBC for hgb </= 6.9gm%   ? - exceptions are ?  -  if ACS susepcted/confirmed then transfuse for hgb </= 8.0gm%,  or  ? -  active bleeding with hemodynamic instability, then transfuse regardless of hemoglobin value ?  At at all times try to transfuse 1 unit prbc as possible with exception of active hemorrhage ? ?-cont ppi > 5/20, carafate > 5/4 ?-per GI not a candidate for full dose a/c  ? ? ?Hypokalemia ? ?Plan ?- am labs pending ? ?Hypoglycemia ?-monitor and replace as able ? ?Homelessness  ?Polysubstance abuse ?Failure to thrive ?Severe protein calorie malnutrition ? ?Plan ?- case management aware ?- psych also following for IVC ?- tube feeds ?- nutrition  consult ? ?Vitamin D Deficiency, mild ?- will need supplementation as outpatient ? ?Best Practice (right click and "Reselect all SmartList Selections" daily)  ? ?Diet/type: tubefeeds ?DVT prophylaxis: other ortho recommended aspirin ?GI prophylaxis: PPI ?Lines: N/A ?Foley:  N/A ?Code Status:  full code ?Last date of multidisciplinary goals of care discussion: 4.30/23 by Pall care -> pall care recalled ? ? ? ?[place]  ? ?The patient Terri [name] is critically ill with multiple organ systems failure and requires high complexity decision making for assessment and support, frequent evaluation and titration of therapies, application of advanced monitoring technologies and extensive interpretation of multiple databases.  ? ?Critical Care Time devoted to patient care services described in this note is  45  Minutes. This time reflects time of care of this signee Dr Belva Crome  Makenzi Bannister. This critical care time does not reflect procedure time, or teaching time or supervisory time of PA/NP/Med student/Med Resident etc but could involve care discussion time  ? ? ? ?Dr. Brand Males, M.D., F.C.C.P ?Pulmonary and Critical Care Medicine ?Medical Director - [facility] ?Staff Physician, [place] ?[place] [facility] ?Pager: [phone], If no answer or between  15:00h - 7:00h: call [phone] ? ?09/23/2021 ?9:50 AM ? ? ?LABS  ? ? ?PULMONARY ?Recent Labs  ?Lab 09/18/21 ?0807 09/19/21 ?2032 09/19/21 ?2346 09/20/21 ?0342 09/22/21 ?1706  ?PHART 7.44 7.31* 7.33* 7.44 7.42  ?PCO2ART 46 61* 57* 43 47  ?PO2ART 61* 107 169* 77* 70*  ?HCO3 31.2* 29.7* 29.4* 29.2* 30.5*  ?O2SAT 95.3 99 100 96.8 99.3  ? ? ?CBC ?Recent Labs  ?Lab 09/18/21 ?7672 09/22/21 ?1055 09/23/21 ?0258  ?HGB 9.0* 6.7* 6.8*  ?HCT 28.0* 20.6* 21.1*  ?WBC 5.0 5.5 4.5  ?PLT 179 85* 63*  ? ? ?COAGULATION ?Recent Labs  ?Lab 09/18/21 ?0846  ?INR 1.2  ? ? ?CARDIAC ? No results for input(s): TROPONINI in the last 168  hours. ?No results for input(s): PROBNP in the last 168 hours. ? ? ?CHEMISTRY ?Recent Labs  ?Lab 09/18/21 ?0947 09/19/21 ?0239 09/20/21 ?0251 09/20/21 ?1649 09/21/21 ?0962 09/21/21 ?1952 09/22/21 ?8366 09/23/21

## 2021-09-23 NOTE — Progress Notes (Signed)
?  [facility] Infectious [facility]   ? ?Date of Admission:  08/31/2021   Total days of antibiotics 15 ?        ? ? ?ID: Terri [name] is a 46 y.o. female with  ?Active Problems: ?  Hypokalemia ?  Polysubstance abuse ([place]) ?  Acute on chronic blood loss anemia ?  Multilobar pneumonia with acute hypoxic respiratory failure ([place]) ?  Portal vein thrombosis ?  Candida infection, esophageal ([place]) ?  Hepatic abscess ?  Swelling of left hand ?  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  Ascites ?  Exudative pleural effusion ?  Hypophosphatemia ?  Vaginal discharge ?  Acute respiratory failure with hypoxia ([place]) ?  Involuntary commitment ?  Protein-calorie malnutrition, severe ? ? ? ?Subjective: ?Afebrile; remains intubated on PRVC due to agitated ? ?Medications:  ? chlorhexidine gluconate (MEDLINE KIT)  15 mL Mouth Rinse BID  ? Chlorhexidine Gluconate Cloth  6 each Topical Daily  ? feeding supplement (PROSource TF)  45 mL Per Tube BID  ? folic acid  1 mg Per Tube Daily  ? guaiFENesin  5 mL Per Tube Q6H  ? levalbuterol  0.63 mg Nebulization Q6H  ? lidocaine  1 patch Transdermal Daily  ? magnesium oxide  400 mg Per Tube BID  ? mouth rinse  15 mL Mouth Rinse 10 times per day  ? metroNIDAZOLE  500 mg Per Tube Q12H  ? midazolam  2 mg Intravenous Once  ? multivitamin  15 mL Per Tube Daily  ? nicotine  21 mg Transdermal Daily  ? nutrition supplement (JUVEN)  1 packet Per Tube BID WC  ? pantoprazole (PROTONIX) IV  40 mg Intravenous Q12H  ? [START ON 09/24/2021] potassium chloride  40 mEq Per Tube Daily  ? sodium chloride flush  10-40 mL Intracatheter Q12H  ? sucralfate  1 g Per Tube TID WC & HS  ? vitamin B-12  1,000 mcg Per Tube Daily  ? ? ?Objective: ?Vital signs in last 24 hours: ?Temp:  [97.3 ?F (36.3 ?C)-99 ?F (37.2 ?C)] 98.1 ?F (36.7 ?C) (05/03 1200) ?Pulse Rate:  [83-119] 95 (05/03 1200) ?Resp:  [17-30] 19 (05/03 1200) ?BP: (97-179)/(61-127) 139/98 (05/03 1200) ?SpO2:   [89 %-100 %] 97 % (05/03 1200) ?FiO2 (%):  [30 %-40 %] 30 % (05/03 1110) ?Weight:  [73 kg] 73 kg (05/03 0259) ?Physical Exam  ?Constitutional:  sedated appears well-developed and well-nourished. No distress.  ?HENT: [place]/AT, PERRLA, no scleral icterus ?Mouth/Throat: OETT in place ?Cardiovascular: tachy, regular rhythm and normal heart sounds. Exam reveals no gallop and no friction rub.  ?No murmur heard.  ?Pulmonary/Chest: Effort normal and exp rhonchi ?Abdominal: Soft. Bowel sounds are decreased.  exhibits no distension. There is no tenderness.  ?Lymphadenopathy: no cervical adenopathy. No axillary adenopathy ?Skin: Skin is warm and dry. Left great toe amputation site has sutures in place, no fluctuance or erythema ? ? ? ?Lab Results ?Recent Labs  ?  09/21/21 ?4492 09/22/21 ?1055 09/22/21 ?1055 09/23/21 ?0258 09/23/21 ?1033  ?WBC  --  5.5  --  4.5  --   ?HGB  --  6.7*   < > 6.8* 7.5*  ?HCT  --  20.6*   < > 21.1* 21.8*  ?NA 134*  --   --  138  --   ?K 3.4*  --   --  3.2*  --   ?CL 102  --   --  104  --   ?CO2 29  --   --  30  --   ?BUN 21*  --   --  23*  --   ?CREATININE 0.51  --   --  0.35*  --   ? < > = values in this interval not displayed.  ? ?Liver Panel ?Recent Labs  ?  09/23/21 ?0258  ?PROT 4.3*  ?ALBUMIN <1.5*  ?AST 48*  ?ALT 11  ?ALKPHOS 122  ?BILITOT 0.4  ? ?Sedimentation Rate ?No results for input(s): ESRSEDRATE in the last 72 hours. ?C-Reactive Protein ?No results for input(s): CRP in the last 72 hours. ? ?Microbiology: ?reviewed ?Studies/Results: ?DG Abd 1 View ? ?Result Date: 09/22/2021 ?CLINICAL DATA:  NG tube placement EXAM: ABDOMEN - 1 VIEW COMPARISON:  09/20/2021 FINDINGS: Dobbhoff tube tip located in the region of the proximal stomach, similar to prior study. Dense airspace opacity again seen in the right medial lung. No dilated loops of bowel identified within the visualized abdomen. IMPRESSION: Tip of feeding tube located in the region of the proximal stomach, similar to prior study. Electronically  Signed   By: Miachel Roux M.D.   On: 09/22/2021 08:03  ? ?DG Chest Port 1 View ? ?Result Date: 09/22/2021 ?CLINICAL DATA:  ETT placement EXAM: PORTABLE CHEST 1 VIEW COMPARISON:  09/20/2021 FINDINGS: Significantly rotated. Endotracheal tube is approximately 4 cm above the carina. Enteric tube passes into the stomach. Right central line tip overlies SVC. Persistent right pleural effusion. Some improvement in right basilar aeration. No pneumothorax. Similar cardiomediastinal contours. IMPRESSION: Persistent right pleural effusion. Right pulmonary opacities with some improvement in basilar aeration since 09/20/2021. Electronically Signed   By: Macy Mis M.D.   On: 09/22/2021 17:22  ? ?[place] LT LOWER EXTREM LTD SOFT TISSUE NON VASCULAR ? ?Result Date: 09/21/2021 ?CLINICAL DATA:  Evaluate for left groin abscess. EXAM: ULTRASOUND LEFT GROIN AREA LIMITED TECHNIQUE: Ultrasound examination of the lower extremity (left groin) soft tissues was performed in the area of clinical concern. COMPARISON:  None. FINDINGS: Joint Space: Not evaluated. Muscles: Not evaluated. Tendons: Not evaluated. Other Soft Tissue Structures: In the left groin region, no fluid collection or mass is seen. Limited assessment of the midline pelvis and left and right lower quadrants of the abdomen demonstrates moderate free ascites. Largest measured fluid pocket in the right lower quadrant is 8 cm in depth. IMPRESSION: 1. No left groin abscess or mass is seen. 2. Moderate free ascites, greatest depth of fluid right lower quadrant is 8 cm. Electronically Signed   By: Telford Nab M.D.   On: 09/21/2021 20:07   ? ? 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 ? ?MRSA pneumonia = continue on ceftaroline (due to  difficulty with vanco dosing) ? ?Hepatic abscess = continues on ceftaroline plus metronidazole ? ?Candidal esphagitis = finished course of treatment ? ?Anemia = thought to be due to gi source, currently on carafate ? ?Respiratory distress = continues to need to be intubated. Managed by PCCM. ? ?Carlyle Basques ?[facility] ?Pager: [phone] ? ?09/23/2021, 1:12 PM ? ? ? ? ? ?

## 2021-09-23 NOTE — Progress Notes (Signed)
? ?                                                                                                                                                     ?                                                   ?Daily Progress Note  ? ?Patient Name: Terri [name]       Date: 09/23/2021 ?DOB: 03-Feb-1976  Age: 46 y.o. MRN#: [id number] ?Attending Physician: Brand Males, MD ?Primary Care Physician: Pcp, No ?Admit Date: 08/31/2021 ? ?Reason for Consultation/Follow-up: Establishing goals of care ? ?Subjective: ?I saw and examined Terri [name] today.  She is intubated and unable to participate in conversation regarding goals of care. ? ?I called and was able to reach her daughter, Gillie Manners.  We discussed clinical course and care of Terri [name] moving forward.  Terri [name] understands the severity of her situation and we discussed need to determine long-term goals moving forward.  She reports that she has been wondering herself how long Terri [name] can be on ventilator and we talked about need for defining long-term goals as realistically we are reaching a point where she would likely need tracheostomy if goal is for continued aggressive care. ? ?Length of Stay: 22 ? ?Current Medications: ?Scheduled Meds:  ? chlorhexidine gluconate (MEDLINE KIT)  15 mL Mouth Rinse BID  ? Chlorhexidine Gluconate Cloth  6 each Topical Daily  ? feeding supplement (PROSource TF)  45 mL Per Tube BID  ? folic acid  1 mg Per Tube Daily  ? guaiFENesin  5 mL Per Tube Q6H  ? levalbuterol  0.63 mg Nebulization Q6H  ? lidocaine  1 patch Transdermal Daily  ? magnesium oxide  400 mg Per Tube BID  ? mouth rinse  15 mL Mouth Rinse 10 times per day  ? metroNIDAZOLE  500 mg Per Tube Q12H  ? midazolam  2 mg Intravenous Once  ? multivitamin  15 mL Per Tube Daily  ? nicotine  21 mg Transdermal Daily  ? nutrition supplement (JUVEN)  1 packet Per Tube BID WC  ? pantoprazole (PROTONIX) IV  40 mg Intravenous Q12H  ? [START ON 09/24/2021] potassium chloride  40 mEq Per Tube Daily  ?  sodium chloride flush  10-40 mL Intracatheter Q12H  ? sucralfate  1 g Per Tube TID WC & HS  ? vitamin B-12  1,000 mcg Per Tube Daily  ? ? ?Continuous Infusions: ? sodium chloride    ? sodium chloride    ? sodium chloride    ? sodium chloride Stopped (09/23/21 1715)  ? ceFTAROline (TEFLARO) IV Stopped (09/23/21 1553)  ?  dextrose 5 % and 0.45% NaCl Stopped (09/20/21 1244)  ? feeding supplement (VITAL 1.5 CAL) 1,000 mL (09/23/21 1712)  ? fentaNYL infusion INTRAVENOUS 125 mcg/hr (09/23/21 1723)  ? propofol (DIPRIVAN) infusion 15 mcg/kg/min (09/23/21 1723)  ? ? ?PRN Meds: ?sodium chloride, acetaminophen (TYLENOL) oral liquid 160 mg/5 mL **OR** acetaminophen, dextrose, fentaNYL (SUBLIMAZE) injection, guaiFENesin-dextromethorphan, haloperidol lactate **AND** [DISCONTINUED] LORazepam, lip balm, loperamide HCl, midazolam, naLOXone (NARCAN)  injection, ondansetron (ZOFRAN) IV, saline, sodium chloride flush ?    ? ?Vital Signs: BP (!) 138/100   Pulse (!) 106   Temp 98.4 ?F (36.9 ?C)   Resp (!) 23   Ht 5' 9"  (1.753 m)   Wt 73 kg   LMP  (LMP Unknown)   SpO2 95%   BMI 23.77 kg/m?  ?SpO2: SpO2: 95 % ?O2 Device: O2 Device: Ventilator ?O2 Flow Rate: O2 Flow Rate (L/min): 7 L/min ? ?Intake/output summary:  ?Intake/Output Summary (Last 24 hours) at 09/23/2021 1812 ?Last data filed at 09/23/2021 1723 ?Gross per 24 hour  ?Intake 3018.31 ml  ?Output 2000 ml  ?Net 1018.31 ml  ? ?LBM: Last BM Date : 09/22/21 ?Baseline Weight: Weight: 53.2 kg ?Most recent weight: Weight: 73 kg ? ?     ?Palliative Assessment/Data: ? ? ? ? ? ?Patient Active Problem List  ? Diagnosis Date Noted  ? Anxiety 02/11/2014  ? Disseminated MSSA with liver abscess, left foot osteo and abscess, 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   ? Acute respiratory failure with hypoxia ([place]) 09/15/2021  ? Hepatic abscess   ? Multilobar pneumonia with acute hypoxic respiratory failure ([place])   ? Acute on chronic blood loss anemia 04/01/2021  ? Swelling of left hand   ? Involuntary commitment  09/19/2021  ? Exudative pleural effusion 09/12/2021  ? 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 Dysuria 09/15/2021  ? Vaginal discharge 09/14/2021  ? Ascites 09/11/2021  ? Hypophosphatemia 09/12/2021  ? Polysubstance abuse ([place]) 01/16/2021  ? Hypokalemia   ? Essential hypertension   ? Candida infection, esophageal ([place]) 09/03/2021  ? Protein-calorie malnutrition, severe 09/21/2021  ? Acute back pain   ? Community acquired pneumonia 07/08/2021  ? Homelessness 07/08/2021  ? Cellulitis of right lower extremity 05/28/2021  ? Vitamin B12 deficiency 05/24/2021  ? Cellulitis 05/22/2021  ? COVID-19 virus infection 05/22/2021  ? Overdose 04/13/2021  ? Hypoxemia 04/13/2021  ? Hypoglycemia 04/13/2021  ? Asthma exacerbation 04/01/2021  ? Iron deficiency anemia 03/18/2021  ? Hyponatremia 03/18/2021  ? Marginal ulcer 03/15/2021  ? Microcytic anemia 02/26/2021  ? Mild protein malnutrition ([place]) 02/26/2021  ? Abdominal pain 01/14/2021  ? 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 Cocaine abuse ([place]) 01/13/2021  ? Heroin abuse ([place]) 01/13/2021  ? Hypertensive urgency 01/13/2021  ? Tobacco abuse 01/13/2021  ? SIRS (systemic inflammatory response syndrome) ([place]) 01/13/2021  ? Acute pyelonephritis 02/11/2019  ? Sepsis due to Escherichia coli (E. coli) ([place]) 02/11/2019  ? AKI (acute kidney injury) ([place])   ? Peptic ulcer disease 02/09/2019  ? 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 Persistent fever 02/09/2019  ? COVID-19 virus not detected   ? Chest pain   ? Bacteremia due to Escherichia coli   ? Esophagitis   ? Hypomagnesemia   ? Sepsis ([place]) 02/02/2019  ? Leucocytosis 02/02/2019  ? Thrombocytosis 02/02/2019  ? Acute lower UTI 02/02/2019  ? Apnea 09/13/2017  ? Cellulitis and abscess of neck 08/12/2017  ? Cellulitis of submandibular region 08/10/2017  ?  Odontogenic infection of jaw 08/10/2017  ? Cholecystitis 02/14/2017  ? Opioid abuse with opioid-induced mood disorder ([place])  01/25/2017  ? Depression 07/12/2012  ? ? ?Palliative Care Assessment & Plan  ? ?Recommendations/Plan: ?Full code/full scope ?Continued discussion with daughter regarding clinical course and need for conversation to further delineate long-term goals moving forward.  Discussed that we are reaching a point where consideration will need to be for tracheostomy if goals for continued aggressive care. ?Her daughter is sick today but is hoping to be able to return to the hospital tomorrow.  We will plan for family meeting when she is able to return. ? ?Code Status: ? ?  ?Code Status Orders  ?(From admission, onward)  ?  ? ? ?  ? ?  Start     Ordered  ? 08/31/21 1940  Full code  Continuous       ? 08/31/21 1939  ? ?  ?  ? ?  ? ?Code Status History   ? ? Date Active Date Inactive Code Status Order ID Comments User Context  ? 07/08/2021 0023 07/08/2021 1821 Full Code [id number]  Lyndee Hensen, DO ED  ? 07/03/2021 2249 07/04/2021 2110 Full Code [id number]  Erskine Emery, MD ED  ? 06/25/2021 0747 06/25/2021 1544 Full Code [id number]  Lennice Sites, DO ED  ? 05/28/2021 1240 05/29/2021 1756 Full Code [id number]  Jonnie Finner, DO ED  ? 05/22/2021 2129 05/24/2021 2331 Full Code [id number]  Toy Baker, MD ED  ? 04/13/2021 0537 04/13/2021 1830 Full Code [id number]  Chotiner, Yevonne Aline, MD ED  ? 04/02/2021 0005 04/06/2021 2019 Full Code [id number]  Toy Baker, MD Inpatient  ? 03/15/2021 2340 03/19/2021 0025 Full Code [id number]  Marcelyn Bruins, MD ED  ? 02/26/2021 0740 03/02/2021 1429 Full Code [id number]  Reubin Milan, MD ED  ? 01/17/2021 0138 01/18/2021 1622 Full Code [id number]  Chotiner, Yevonne Aline, MD Inpatient  ? 01/14/2021 1739 01/16/2021 1246 Full Code [id number]  Geradine Girt, DO ED  ? 01/13/2021 1456 01/14/2021 1151 Full Code [id number]  Jacqlyn Krauss, MD ED  ? 07/16/2019 0003 07/16/2019 1423 Full Code [id number]  Delaine Lame ED  ? 02/02/2019 2333 02/11/2019 1754 Full Code [id number]  Elwyn Reach, MD  Inpatient  ? 09/13/2017 0912 09/15/2017 2035 Full Code [id number]  Kayleen Memos, DO ED  ? 08/10/2017 2257 08/19/2017 1320 Full Code [id number]  Etta Quill, DO ED  ? 02/14/2017 1535 02/16/2017 1347 Full Code [id number]

## 2021-09-23 NOTE — Progress Notes (Signed)
eLink Physician-Brief Progress Note ?Patient Name: SONNET RIZOR ?DOB: 1976/01/13 ?MRN: [id number] ? ? ?Date of Service ? 09/23/2021  ?HPI/Events of Note ? Hemoglobin 6.8 gm / dl. Recent GI bleeding but no overt bleeding currently.  ?eICU Interventions ? One unit PRBC ordered transfused.  ? ? ? ?  ? ?Kerry Kass Rylan Kaufmann ?09/23/2021, 4:18 AM ?

## 2021-09-23 NOTE — Evaluation (Signed)
SLP Cancellation Note ? ?Patient Details ?Name: SERAFINA TOPHAM ?MRN: [id number] ?DOB: Oct 20, 1975 ? ? ?Cancelled treatment:       Reason Eval/Treat Not Completed: Other (comment);Medical issues which prohibited therapy (pt remains intubated, please reorder after extubation if desire) ? ?[name], MS CCC SLP ?Acute Rehab Services ?Office [phone] ?Pager [phone] ? ? ?Macario Golds ?09/23/2021, 7:15 AM ? ? ? ?

## 2021-09-23 NOTE — Progress Notes (Addendum)
Demisha's sister encountered me in the hallway and asked me to come and pray with family. I offered prayer and emotional support as they shared about how she is doing. ? ?[employer], Bcc ?Pager, [phone] ? ?

## 2021-09-23 NOTE — Progress Notes (Signed)
[facility] ADULT ICU REPLACEMENT PROTOCOL ? ? ?The patient does apply for the [facility] Adult ICU Electrolyte Replacment Protocol based on the criteria listed below:  ? ?1.Exclusion criteria: TCTS patients, ECMO patients, and Dialysis patients ?2. Is GFR >/= 30 ml/min? Yes.    ?Patient's GFR today is >60 ?3. Is SCr </= 2? Yes.   ?Patient's SCr is 0.35 mg/dL ?4. Did SCr increase >/= 0.5 in 24 hours? No. ?5.Pt's weight >40kg  Yes.   ?6. Abnormal electrolyte(s): k 3.2  ?7. Electrolytes replaced per protocol ?8.  Call MD STAT for K+ </= 2.5, Phos </= 1, or Mag </= 1 ?Physician:   ? ?[facility], Terri [name] 09/23/2021 6:07 AM ? ?

## 2021-09-24 ENCOUNTER — Inpatient Hospital Stay (HOSPITAL_COMMUNITY): Payer: Medicaid Other

## 2021-09-24 DIAGNOSIS — B3781 Candidal esophagitis: Secondary | ICD-10-CM | POA: Diagnosis not present

## 2021-09-24 DIAGNOSIS — K922 Gastrointestinal hemorrhage, unspecified: Secondary | ICD-10-CM | POA: Diagnosis not present

## 2021-09-24 DIAGNOSIS — K750 Abscess of liver: Secondary | ICD-10-CM | POA: Diagnosis not present

## 2021-09-24 DIAGNOSIS — J9601 Acute respiratory failure with hypoxia: Secondary | ICD-10-CM | POA: Diagnosis not present

## 2021-09-24 DIAGNOSIS — A4901 Methicillin susceptible Staphylococcus aureus infection, unspecified site: Secondary | ICD-10-CM | POA: Diagnosis not present

## 2021-09-24 DIAGNOSIS — Z7189 Other specified counseling: Secondary | ICD-10-CM | POA: Diagnosis not present

## 2021-09-24 DIAGNOSIS — J181 Lobar pneumonia, unspecified organism: Secondary | ICD-10-CM | POA: Diagnosis not present

## 2021-09-24 LAB — TYPE AND SCREEN
ABO/RH(D): B POS
Antibody Screen: NEGATIVE
Unit division: 0
Unit division: 0

## 2021-09-24 LAB — GLUCOSE, CAPILLARY
Glucose-Capillary: 103 mg/dL — ABNORMAL HIGH (ref 70–99)
Glucose-Capillary: 112 mg/dL — ABNORMAL HIGH (ref 70–99)
Glucose-Capillary: 116 mg/dL — ABNORMAL HIGH (ref 70–99)
Glucose-Capillary: 119 mg/dL — ABNORMAL HIGH (ref 70–99)
Glucose-Capillary: 133 mg/dL — ABNORMAL HIGH (ref 70–99)
Glucose-Capillary: 93 mg/dL (ref 70–99)

## 2021-09-24 LAB — BPAM RBC
Blood Product Expiration Date: 202305212359
Blood Product Expiration Date: 202305222359
ISSUE DATE / TIME: 202305021354
ISSUE DATE / TIME: 202305030507
Unit Type and Rh: 7300
Unit Type and Rh: 7300

## 2021-09-24 LAB — CBC
HCT: 27.1 % — ABNORMAL LOW (ref 36.0–46.0)
Hemoglobin: 9.1 g/dL — ABNORMAL LOW (ref 12.0–15.0)
MCH: 26.6 pg (ref 26.0–34.0)
MCHC: 33.6 g/dL (ref 30.0–36.0)
MCV: 79.2 fL — ABNORMAL LOW (ref 80.0–100.0)
Platelets: 94 10*3/uL — ABNORMAL LOW (ref 150–400)
RBC: 3.42 MIL/uL — ABNORMAL LOW (ref 3.87–5.11)
RDW: 21.2 % — ABNORMAL HIGH (ref 11.5–15.5)
WBC: 6.7 10*3/uL (ref 4.0–10.5)
nRBC: 0 % (ref 0.0–0.2)

## 2021-09-24 LAB — PHOSPHORUS: Phosphorus: 3.1 mg/dL (ref 2.5–4.6)

## 2021-09-24 LAB — CULTURE, BLOOD (ROUTINE X 2)
Culture: NO GROWTH
Culture: NO GROWTH
Special Requests: ADEQUATE

## 2021-09-24 LAB — PROTIME-INR
INR: 1.2 (ref 0.8–1.2)
Prothrombin Time: 15 seconds (ref 11.4–15.2)

## 2021-09-24 LAB — AEROBIC/ANAEROBIC CULTURE W GRAM STAIN (SURGICAL/DEEP WOUND)

## 2021-09-24 LAB — CK TOTAL AND CKMB (NOT AT ARMC)
CK, MB: 3.5 ng/mL (ref 0.5–5.0)
Relative Index: INVALID (ref 0.0–2.5)
Total CK: 28 U/L — ABNORMAL LOW (ref 38–234)

## 2021-09-24 LAB — BASIC METABOLIC PANEL
Anion gap: 3 — ABNORMAL LOW (ref 5–15)
BUN: 28 mg/dL — ABNORMAL HIGH (ref 6–20)
CO2: 31 mmol/L (ref 22–32)
Calcium: 7.6 mg/dL — ABNORMAL LOW (ref 8.9–10.3)
Chloride: 105 mmol/L (ref 98–111)
Creatinine, Ser: 0.37 mg/dL — ABNORMAL LOW (ref 0.44–1.00)
GFR, Estimated: >60 mL/min (ref >60)
Glucose, Bld: 117 mg/dL — ABNORMAL HIGH (ref 70–99)
Potassium: 3.9 mmol/L (ref 3.5–5.1)
Sodium: 139 mmol/L (ref 135–145)

## 2021-09-24 LAB — MAGNESIUM: Magnesium: 1.9 mg/dL (ref 1.7–2.4)

## 2021-09-24 LAB — LACTIC ACID, PLASMA: Lactic Acid, Venous: 0.9 mmol/L (ref 0.5–1.9)

## 2021-09-24 LAB — TRIGLYCERIDES: Triglycerides: 132 mg/dL (ref <150)

## 2021-09-24 MED ORDER — FUROSEMIDE 10 MG/ML IJ SOLN
40.0000 mg | Freq: Three times a day (TID) | INTRAMUSCULAR | Status: DC
Start: 2021-09-24 — End: 2021-09-26
  Administered 2021-09-24 – 2021-09-26 (×6): 40 mg via INTRAVENOUS
  Filled 2021-09-24 (×6): qty 4

## 2021-09-24 MED ORDER — MAGNESIUM SULFATE 2 GM/50ML IV SOLN
2.0000 g | Freq: Once | INTRAVENOUS | Status: AC
  Administered 2021-09-24: 2 g via INTRAVENOUS
  Filled 2021-09-24: qty 50

## 2021-09-24 MED ORDER — MAGNESIUM SULFATE 2 GM/50ML IV SOLN
2.0000 g | Freq: Once | INTRAVENOUS | Status: AC
Start: 2021-09-24 — End: 2021-09-24
  Administered 2021-09-24: 2 g via INTRAVENOUS
  Filled 2021-09-24: qty 50

## 2021-09-24 MED ORDER — POTASSIUM CHLORIDE 20 MEQ PO PACK
40.0000 meq | PACK | Freq: Two times a day (BID) | ORAL | Status: DC
  Filled 2021-09-24: qty 2

## 2021-09-24 NOTE — Progress Notes (Signed)
[facility] ADULT ICU REPLACEMENT PROTOCOL ? ? ?The patient does apply for the [facility] Adult ICU Electrolyte Replacment Protocol based on the criteria listed below:  ? ?1.Exclusion criteria: TCTS patients, ECMO patients, and Dialysis patients ?2. Is GFR >/= 30 ml/min? Yes.    ?Patient's GFR today is >60 ?3. Is SCr </= 2? Yes.   ?Patient's SCr is 0.37 mg/dL ?4. Did SCr increase >/= 0.5 in 24 hours? No. ?5.Pt's weight >40kg  Yes.   ?6. Abnormal electrolyte(s): mag 1.9  ?7. Electrolytes replaced per protocol ?8.  Call MD STAT for K+ </= 2.5, Phos </= 1, or Mag </= 1 ?Physician:  n/a ? ?Terri [name] 09/24/2021 5:00 AM ? ?

## 2021-09-24 NOTE — Progress Notes (Signed)
?   09/24/21 0400  ?HEENT  ?R Eye Edema  ? ?New edema noted to Right eyelids (mostly upper) - normal in color - pupils equal and reactive to light 7m; tracking; Elink RN notified via camera in room to assess and agrees with this RN that pt tends to favor right side, combined with tube holder and this may have caused some edema - Pt fully repositioned, but pt still favoring laying to right.  Able to achieve better positioning with bed turn assist in conjunction with pillow positioning.  Cool compress applied to eye.  Eye Edema appears much improved by end of hour.  ?

## 2021-09-24 NOTE — Progress Notes (Signed)
? ?                                                                                                                                                     ?                                                   ?Daily Progress Note  ? ?Patient Name: Terri [name]       Date: 09/24/2021 ?DOB: 26-Sep-1975  Age: 46 y.o. MRN#: [id number] ?Attending Physician: Brand Males, MD ?Primary Care Physician: Pcp, No ?Admit Date: 08/31/2021 ? ?Reason for Consultation/Follow-up: Establishing goals of care ? ?Subjective: ?I saw and examined [name] today.  She remains intubated and is not able to participate in conversation. ? ?I met today with her daughter, Gillie Manners, her sister, Lattie Haw, and her prior long-term boyfriend, Fritz Pickerel. ? ?We discussed clinical course as well as wishes moving forward in regard to advance directives and care plan.  Concepts specific to code status and her care plan this hospitalization discussed.  We discussed difference between a aggressive medical intervention path and a palliative, comfort focused care path.  Values and goals of care important to patient and family were attempted to be elicited. ? ?Gillie Manners reports that she has not been close to her mother but her family has been in discussion and they all agree that they believe that [name] would want to continue with aggressive care until any opportunity for improvement is exhausted.  She said that their opinion would change if something occurred to [name] for brain-dead or in a vegetative state, but at this point in time they believe she would want to continue to pursue aggressive interventions, including tracheostomy and long-term care if needed. ? ?We discussed consideration for time-limited trial of these interventions if they believe that will be her wish moving forward.  Discussed that if tracheostomy is placed, I would recommend reassessing how she is doing in a few weeks to see if we are still in a pathway towards her making recovery towards which  she would find to be an acceptable quality of life. ? ?Length of Stay: 23 ? ?Current Medications: ?Scheduled Meds:  ? chlorhexidine gluconate (MEDLINE KIT)  15 mL Mouth Rinse BID  ? Chlorhexidine Gluconate Cloth  6 each Topical Daily  ? feeding supplement (PROSource TF)  45 mL Per Tube BID  ? folic acid  1 mg Per Tube Daily  ? furosemide  40 mg Intravenous Q8H  ? guaiFENesin  5 mL Per Tube Q6H  ? levalbuterol  0.63 mg Nebulization Q6H  ? lidocaine  1 patch Transdermal Daily  ? magnesium oxide  400 mg Per Tube  BID  ? mouth rinse  15 mL Mouth Rinse 10 times per day  ? metroNIDAZOLE  500 mg Per Tube Q12H  ? midazolam  2 mg Intravenous Once  ? multivitamin  15 mL Per Tube Daily  ? nicotine  21 mg Transdermal Daily  ? nutrition supplement (JUVEN)  1 packet Per Tube BID WC  ? pantoprazole (PROTONIX) IV  40 mg Intravenous Q12H  ? potassium chloride  40 mEq Per Tube Daily  ? sodium chloride flush  10-40 mL Intracatheter Q12H  ? sucralfate  1 g Per Tube TID WC & HS  ? vitamin B-12  1,000 mcg Per Tube Daily  ? ? ?Continuous Infusions: ? sodium chloride    ? sodium chloride 10 mL/hr at 09/24/21 1700  ? sodium chloride    ? sodium chloride Stopped (09/24/21 1039)  ? ceFTAROline (TEFLARO) IV Stopped (09/24/21 1518)  ? dextrose 5 % and 0.45% NaCl Stopped (09/20/21 1244)  ? feeding supplement (VITAL 1.5 CAL) 1,000 mL (09/24/21 1428)  ? fentaNYL infusion INTRAVENOUS 225 mcg/hr (09/24/21 1700)  ? propofol (DIPRIVAN) infusion 15 mcg/kg/min (09/24/21 1700)  ? ? ?PRN Meds: ?sodium chloride, acetaminophen (TYLENOL) oral liquid 160 mg/5 mL **OR** acetaminophen, dextrose, fentaNYL (SUBLIMAZE) injection, guaiFENesin-dextromethorphan, haloperidol lactate **AND** [DISCONTINUED] LORazepam, lip balm, loperamide HCl, midazolam, naLOXone (NARCAN)  injection, ondansetron (ZOFRAN) IV, saline, sodium chloride flush ?    ? ?Vital Signs: BP (!) 122/94   Pulse 89   Temp 97.8 ?F (36.6 ?C) (Axillary)   Resp 15   Ht _0  (1.753 m)   Wt 73.4 kg    LMP  (LMP Unknown)   SpO2 98%   BMI 23.90 kg/m?  ?SpO2: SpO2: 98 % ?O2 Device: O2 Device: Ventilator ?O2 Flow Rate: O2 Flow Rate (L/min): 7 L/min ? ?Intake/output summary:  ?Intake/Output Summary (Last 24 hours) at 09/24/2021 1752 ?Last data filed at 09/24/2021 1700 ?Gross per 24 hour  ?Intake 2529.63 ml  ?Output 3350 ml  ?Net -820.37 ml  ? ?LBM: Last BM Date : 09/23/21 ?Baseline Weight: Weight: 53.2 kg ?Most recent weight: Weight: 73.4 kg ? ?     ?Palliative Assessment/Data: ? ? ? ? ? ?Patient Active Problem List  ? Diagnosis Date Noted  ? Anxiety 02/11/2014  ? Disseminated MSSA with liver abscess, left foot osteo and abscess, 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   ? Acute respiratory failure with hypoxia ([place]) 09/15/2021  ? Hepatic abscess   ? Multilobar pneumonia with acute hypoxic respiratory failure ([place])   ? Acute on chronic blood loss anemia 04/01/2021  ? Swelling of left hand   ? Involuntary commitment 09/19/2021  ? Exudative pleural effusion 09/12/2021  ? 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 Dysuria 09/15/2021  ? Vaginal discharge 09/14/2021  ? Ascites 09/11/2021  ? Hypophosphatemia 09/12/2021  ? Polysubstance abuse ([place]) 01/16/2021  ? Hypokalemia   ? Essential hypertension   ? Candida infection, esophageal ([place]) 09/03/2021  ? Protein-calorie malnutrition, severe 09/21/2021  ? Acute back pain   ? Community acquired pneumonia 07/08/2021  ? Homelessness 07/08/2021  ? Cellulitis of right lower extremity 05/28/2021  ? Vitamin B12 deficiency 05/24/2021  ? Cellulitis 05/22/2021  ? COVID-19 virus infection 05/22/2021  ? Overdose 04/13/2021  ? Hypoxemia 04/13/2021  ? Hypoglycemia 04/13/2021  ? Asthma exacerbation 04/01/2021  ? Iron deficiency anemia 03/18/2021  ? Hyponatremia 03/18/2021  ? Marginal ulcer 03/15/2021  ? Microcytic anemia 02/26/2021  ? Mild protein malnutrition ([place]) 02/26/2021  ? Abdominal pain 01/14/2021  ? Ulcer at site of  surgical anastomosis following bypass of stomach 01/13/2021  ? Amphetamine abuse ([place])  01/13/2021  ? Cocaine abuse ([place]) 01/13/2021  ? Heroin abuse ([place]) 01/13/2021  ? Hypertensive urgency 01/13/2021  ? Tobacco abuse 01/13/2021  ? SIRS (systemic inflammatory response syndrome) ([place]) 01/13/2021  ? Acute pyelonephritis 02/11/2019  ? Sepsis due to Escherichia coli (E. coli) ([place]) 02/11/2019  ? AKI (acute kidney injury) ([place])   ? Peptic ulcer disease 02/09/2019  ? 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 Persistent fever 02/09/2019  ? COVID-19 virus not detected   ? Chest pain   ? Bacteremia due to Escherichia coli   ? Esophagitis   ? Hypomagnesemia   ? Sepsis ([place]) 02/02/2019  ? Leucocytosis 02/02/2019  ? Thrombocytosis 02/02/2019  ? Acute lower UTI 02/02/2019  ? Apnea 09/13/2017  ? Cellulitis and abscess of neck 08/12/2017  ? Cellulitis of submandibular region 08/10/2017  ? Odontogenic infection of jaw 08/10/2017  ? Cholecystitis 02/14/2017  ? Opioid abuse with opioid-induced mood disorder ([place]) 01/25/2017  ? Depression 07/12/2012  ? ? ?Palliative Care Assessment & Plan  ? ?Recommendations/Plan: ?Full code/full scope ?Following discussion, family reports that,at the current time, they would want to continue with any and all aggressive interventions.  They are going to speak again this evening, but daughter indicates that at this point in time they would consent to trach if that is the medical recommendation for continuing to pursue aggressive care.  We discussed that this will be a very long road ahead with need for significant long-term care rather than her going home if she survives the hospital discharge.  Family expressed understanding.  We also discussed that if decision is made to continue with aggressive care, I would recommend they reevaluate in a few weeks if she is making strides towards what she would find an acceptable quality of life.  If not, I recommended they reconsider plan. ? ?Code Status: ? ?  ?Code Status Orders  ?(From admission, onward)  ?  ? ? ?  ? ?  Start     Ordered  ? 08/31/21 1940   Full code  Continuous       ? 08/31/21 1939  ? ?  ?  ? ?  ? ?Code Status History   ? ? Date Active Date Inactive Code Status Order ID Comments User Context  ? 07/08/2021 0023 07/08/2021 1821 Full Code [id number]  Bri

## 2021-09-24 NOTE — Progress Notes (Signed)
? ? ?[facility] ? ?Date of Admission:  08/31/2021    ? ?Total days of antibiotics 16 ?        ?ASSESSMENT: ? ?Terri [name] remains intubated and is alert and shaking her head appropriately. Currently on PRVC and restarted on sedation. Palliative Care continuing goals of care and remains a full scope of care. Anemia improved following PRBC administration. Appears to be tolerating Teflaro with no adverse side effects. Left foot healing well. Continue current dose of Teflaro. Remaining medical and supportive care per CCM.  ? ?PLAN: ? ?Continue Teflaro and metronidazole ?Anemia and respiratory management per CCM.  ?Remaining medical and supportive care per primary. ? ?Active Problems: ?  Hypokalemia ?  Polysubstance abuse ([place]) ?  Acute on chronic blood loss anemia ?  Multilobar pneumonia with acute hypoxic respiratory failure ([place]) ?  Portal vein thrombosis ?  Candida infection, esophageal ([place]) ?  Hepatic abscess ?  Swelling of left hand ?  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  Ascites ?  Exudative pleural effusion ?  Hypophosphatemia ?  Vaginal discharge ?  Acute respiratory failure with hypoxia ([place]) ?  Involuntary commitment ?  Protein-calorie malnutrition, severe ? ? ? chlorhexidine gluconate (MEDLINE KIT)  15 mL Mouth Rinse BID  ? Chlorhexidine Gluconate Cloth  6 each Topical Daily  ? feeding supplement (PROSource TF)  45 mL Per Tube BID  ? folic acid  1 mg Per Tube Daily  ? guaiFENesin  5 mL Per Tube Q6H  ? levalbuterol  0.63 mg Nebulization Q6H  ? lidocaine  1 patch Transdermal Daily  ? magnesium oxide  400 mg Per Tube BID  ? mouth rinse  15 mL Mouth Rinse 10 times per day  ? metroNIDAZOLE  500 mg Per Tube Q12H  ? midazolam  2 mg Intravenous Once  ? multivitamin  15 mL Per Tube Daily  ? nicotine  21 mg Transdermal Daily  ? nutrition supplement (JUVEN)  1 packet Per Tube BID WC  ? pantoprazole (PROTONIX) IV  40 mg Intravenous Q12H   ? potassium chloride  40 mEq Per Tube Daily  ? sodium chloride flush  10-40 mL Intracatheter Q12H  ? sucralfate  1 g Per Tube TID WC & HS  ? vitamin B-12  1,000 mcg Per Tube Daily  ? ? ?SUBJECTIVE: ? ?Afebrile overnight with no acute events. Remains sedated and intubated.  ? ?Allergies  ?Allergen Reactions  ? Nsaids Other (See Comments)  ?  Stomach ulcer ?  ? Ketorolac Tromethamine Itching and Nausea And Vomiting  ? ? ? ?Review of Systems: ?Review of Systems  ?Unable to perform ROS: Intubated  ? ? ? ?OBJECTIVE: ?Vitals:  ? 09/24/21 0745 09/24/21 0800 09/24/21 0815 09/24/21 0830  ?BP:  130/88    ?Pulse: 83 76 100 92  ?Resp: 19 18 20 17   ?Temp:  98.4 ?F (36.9 ?C)    ?TempSrc:  Axillary    ?SpO2: 97% 96% 100% 97%  ?Weight:      ?Height:      ? ?Body mass index is 23.9 kg/m?. ? ?Physical Exam ?Constitutional:   ?   General: She is not in acute distress. ?   Appearance: She is well-developed. She is ill-appearing.  ?   Interventions: She is intubated and restrained.  ?Cardiovascular:  ?   Rate and Rhythm: Normal rate and regular rhythm.  ?   Heart sounds: Normal heart sounds.  ?Pulmonary:  ?  Effort: Tachypnea present. She is intubated.  ?   Breath sounds: Normal breath sounds.  ?Skin: ?   General: Skin is warm and dry.  ?Neurological:  ?   Mental Status: She is alert.  ? ? ?Lab Results ?Lab Results  ?Component Value Date  ? WBC 6.7 09/24/2021  ? HGB 9.1 (L) 09/24/2021  ? HCT 27.1 (L) 09/24/2021  ? MCV 79.2 (L) 09/24/2021  ? PLT 94 (L) 09/24/2021  ?  ?Lab Results  ?Component Value Date  ? CREATININE 0.37 (L) 09/24/2021  ? BUN 28 (H) 09/24/2021  ? NA 139 09/24/2021  ? K 3.9 09/24/2021  ? CL 105 09/24/2021  ? CO2 31 09/24/2021  ?  ?Lab Results  ?Component Value Date  ? ALT 11 09/23/2021  ? AST 48 (H) 09/23/2021  ? ALKPHOS 122 09/23/2021  ? BILITOT 0.4 09/23/2021  ?  ? ?Microbiology: ?Recent Results (from the past 240 hour(s))  ?MRSA Next Gen by PCR, Nasal     Status: Abnormal  ? Collection Time: 09/15/21  9:22 AM  ?  Specimen: Nasal Mucosa; Nasal Swab  ?Result Value Ref Range Status  ? MRSA by PCR Next Gen DETECTED (A) NOT DETECTED Final  ?  Comment: (NOTE) ?The GeneXpert MRSA Assay (FDA approved for NASAL specimens only), ?is one component of a comprehensive MRSA colonization surveillance ?program. It is not intended to diagnose MRSA infection nor to guide ?or monitor treatment for MRSA infections. ?Test performance is not FDA approved in patients less than 2 years ?old. ?Performed at [facility], [place] [address] ?[place], [place] [address] ?  ?Urine Culture     Status: None  ? Collection Time: 09/15/21  9:45 PM  ? Specimen: Urine, Clean Catch  ?Result Value Ref Range Status  ? Specimen Description   Final  ?  URINE, CLEAN CATCH ?Performed at [facility], [place] [address], [place], [place] [address] ?  ? Special Requests   Final  ?  NONE ?Performed at [facility], [place] [address] [place], [place] [address] ?  ? Culture   Final  ?  NO GROWTH ?Performed at [facility], [place] [address], [place], [place] [address] ?  ? Report Status 09/17/2021 FINAL  Final  ?Aerobic/Anaerobic Culture w Gram Stain (surgical/deep wound)     Status: None  ? Collection Time: 09/17/21 12:39 AM  ? Specimen: PATH Other; Tissue  ?Result Value Ref Range Status  ? Specimen Description   Final  ?  ABSCESS LEFT FOOT ?Performed at [facility], [place] [address], [place], [place] [address] ?  ? Special Requests   Final  ?  NONE ?Performed at [facility], [place] [address] [place], [place] [address] ?  ? Gram Stain NO WBC SEEN ?RARE GRAM POSITIVE COCCI IN PAIRS ?  Final  ? Culture   Final  ?  MODERATE METHICILLIN RESISTANT STAPHYLOCOCCUS AUREUS ?NO ANAEROBES ISOLATED ?Performed at [facility], [place] [address], [place], [place] [address] ?  ? Report Status 09/22/2021 FINAL  Final  ? Organism ID, Bacteria METHICILLIN RESISTANT STAPHYLOCOCCUS AUREUS  Final  ?     Susceptibility  ? Methicillin resistant staphylococcus aureus - MIC*  ?  CIPROFLOXACIN >=8 RESISTANT Resistant   ?  ERYTHROMYCIN >=8 RESISTANT Resistant   ?  GENTAMICIN <=0.5 SENSITIVE Sensitive   ?  OXACILLIN >=4 RESISTANT Resistant   ?  TETRACYCLINE <=1 SENSITIVE Sensitive   ?  VANCOMYCIN  1 SENSITIVE Sensitive   ?  TRIMETH/SULFA >=320 RESISTANT Resistant   ?  CLINDAMYCIN <=0.25 SENSITIVE Sensitive   ?  RIFAMPIN <=0.5 SENSITIVE Sensitive   ?  Inducible Clindamycin NEGATIVE Sensitive   ?  * MODERATE METHICILLIN RESISTANT STAPHYLOCOCCUS AUREUS  ?Aerobic/Anaerobic Culture w Gram Stain (surgical/deep wound)     Status: None  ? Collection Time: 09/17/21  1:02 AM  ? Specimen: PATH Other; Tissue  ?Result Value Ref Range Status  ? Specimen Description   Final  ?  ABSCESS LEFT GREAT TOE ?Performed at [facility], [place] [address] [place], [place] [address] ?  ? Special Requests   Final  ?  NONE ?Performed at [facility], [place] [address] [place], [place] [address] ?  ? Gram Stain   Final  ?  MODERATE WBC PRESENT,BOTH PMN AND MONONUCLEAR ?RARE GRAM POSITIVE COCCI IN CLUSTERS ?  ? Culture   Final  ?  ABUNDANT METHICILLIN RESISTANT STAPHYLOCOCCUS AUREUS ?MODERATE HAEMOPHILUS PARAINFLUENZAE ?NO ANAEROBES ISOLATED ?Performed at [facility], [place] [address] [place],  [address] ?  ? Report Status 09/24/2021 FINAL  Final  ? Organism ID, Bacteria METHICILLIN RESISTANT STAPHYLOCOCCUS AUREUS  Final  ?    Susceptibility  ? Methicillin resistant staphylococcus aureus - MIC*  ?  CIPROFLOXACIN >=8 RESISTANT Resistant   ?  ERYTHROMYCIN >=8 RESISTANT Resistant   ?  GENTAMICIN <=0.5 SENSITIVE Sensitive   ?  OXACILLIN >=4 RESISTANT Resistant   ?  TETRACYCLINE <=1 SENSITIVE Sensitive   ?  VANCOMYCIN 1 SENSITIVE Sensitive   ?  TRIMETH/SULFA >=320 RESISTANT Resistant   ?  CLINDAMYCIN <=0.25 SENSITIVE Sensitive   ?  RIFAMPIN <=0.5 SENSITIVE Sensitive   ?  Inducible Clindamycin NEGATIVE Sensitive    ?  * ABUNDANT METHICILLIN RESISTANT STAPHYLOCOCCUS AUREUS  ?Culture, blood (Routine X 2) w Reflex to ID Panel     Status: None  ? Collection Time: 09/17/21  7:08 PM  ? Specimen: BLOOD  ?Result Value Re

## 2021-09-24 NOTE — Progress Notes (Signed)
? ?NAME:  Terri [name], MRN:  [id number], DOB:  November 29, 1975, LOS: 27 ?ADMISSION DATE:  08/31/2021, CONSULTATION DATE:  09/19/21 ?REFERRING MD:  E link doctor, CHIEF COMPLAINT:  dark colored stool   ? ?BRIEF  ?46 yo female with pmh homelessness, recurrent gib, chronic microcytic anemia, htn, polysubstance abuse, roux en y presented to [facility] with reports of dark colored stools. Pt has history of chronic gib 2/2 peptic ulcers. She underwent endoscopy with large anastomotic ulceration noted and started on ppi. Hgb stabliized.  ? ?She was then found to have mssa bacteremia that appears to be disseminated. Her L great toe was noted to be gangrenous vascular surgery and orthopedics determined amputation would be best route for pt and this was done on 4/26. Cx from this was MRSA and she was broadened to vancomycin. She has multiple liver abscess noted and a history of portal v thrombosis that is reportedly stable. She has not had any a/c 2/2 GIB's.  ? ?She is being treated for 6 weeks of iv abx. Over the past two - three days pt has req increasing oxygen support ultimately req transfer to ICU. She was placed on bipap overnight and ccm was then consulted for evaluation for possible intubation.  ? ?All history is obtained from chart and RN as pt is not oriented for any discussion.  ? ?Pertinent  Medical History  ?Portal v thrombosis, polysubstance abuse, chronic anemia,  ? ?Significant Hospital Events: ?Including procedures, antibiotic start and stop dates in addition to other pertinent events   ?4/10 admitted ?4/13 egd for gib ?4/15 vascular surgery consulted for gangrenous L toe ?4/18 ID consulted for mssa infection, liver abscess, osteo, mrsa pneumonia ?4/23 Thoracentesis, culture negative, exudative ?4/26: ortho consulted for amputation of L great toe ?4/27 psych consulted for capacity, pt was placed on ivc ?4/28: transferred to ICU for increasing oxygen requirement ?4/29: ccm consulted  ?4/30 - Patient intubated  ?Palliative  care consult - full code ?5/1 - Korea groin left - no abscess, Moderate Ascites + ?5/2 - Faild SBT.  Remains on propofol and fentanyl.  ?S/p 1 unit probc ?Pall care consult ordeed ?ID consult - noted ?Eso candidadis - finished diflucan ?MRSA pna, great toe osteo s/p amputation - change vanco to teflaro ?Polymicrobial hepatic abscess -> change unasyn to teflaro ?09/23/21:  fever curve better. On vent Fio2 30%, Diprivan gtt, Fent gtt. Hgb again < 7gm% and 1 unit prbc given. On vent. Ex boyrfriend Fritz Pickerel at bedside ?Pallative care - restablished ? ?Interim History / Subjective:  ? ? ?09/24/21 - 40% fio2, afebrile since 09/22/21. WBC normal. +16L volume overload.  Higher sedation eeds ? ?Objective   ?Blood pressure 130/88, pulse 92, temperature 98.4 ?F (36.9 ?C), temperature source Axillary, resp. rate 17, height '5\' 9"'$  (1.753 m), weight 73.4 kg, SpO2 97 %. ?   ?Vent Mode: PRVC ?FiO2 (%):  [30 %-40 %] 40 % ?Set Rate:  [18 bmp] 18 bmp ?Vt Set:  [530 mL] 530 mL ?PEEP:  [5 cmH20] 5 cmH20 ?Plateau Pressure:  [10 cmH20-23 cmH20] 19 cmH20  ? ?Intake/Output Summary (Last 24 hours) at 09/24/2021 0949 ?Last data filed at 09/24/2021 0800 ?Gross per 24 hour  ?Intake 2431.39 ml  ?Output 1725 ml  ?Net 706.39 ml  ? ?Filed Weights  ? 09/22/21 0416 09/23/21 0259 09/24/21 0500  ?Weight: 71.8 kg 73 kg 73.4 kg  ? ? ?General Appearance: Emaciated on vent ?Head:  Normocephalic, without obvious abnormality, atraumatic ?Eyes:  PERRL - yes, conjunctiva/corneas -  muddy     ?Ears:  Normal external ear canals, both ears ?Nose:  G tube - no ?Throat:  ETT TUBE - yes , OG tube - yes ?Neck:  Supple,  No enlargement/tenderness/nodules ?Lungs: Clear to auscultation bilaterally, Ventilator   Synchrony - yes ?Heart:  S1 and S2 normal, no murmur, CVP - no.  Pressors - no ?Abdomen:  Soft, no masses, no organomegaly ?Genitalia / Rectal:  Not done ?Extremities:  Extremities- edema ?Skin:  ntact in exposed areas . Sacral area - not examined ?Neurologic:  Sedation - diprivan  gtt, fent gtt  -> RASS - -4  ? ? ? ? ? ? ? ? ?Resolved Hospital Problem list   ?Septic Shock - - due to the above. - Off pressors since 5/1.  ? ?Assessment & Plan:  ?Acute hypoxic hypercarbic resp failure  (negative duplex LE 4/30. Intubated 09/20/21) due to MRSA pneumonia ?Bilateral Pleural Effusions, R > L  ?-  Right thoracentesis performed 4/23, exudative based on pleural fluid studies, culture negative ?- [place] 5/3 - large rt efvuvion ? ? ?09/24/2021 - > does not meet criteria for SBT/Extubation in setting of Acute Respiratory Failure due to encephalopathy, cachexia and sepsis and volume overload ? ?Plan ?- SBT as tolerated ?- Continue full vent support ?- Monitor resiual R pleural effusion - drain if grows/indiacated esp with no response to lasix ?- start lasix for volume overload ?- Daily SBT/WUA ?- If unable to get off vent with diuresis the needs trach v comfort care ? ?Volume overload ? ?5/4 + +16L since admit ? ?Plan ? - scheduled lasix ? ? 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 ?5/4 - fever reducing/resolved after change to Teflaro ? ?Plan ?- Teflaro since 09/23/21 ? ? ?L groin possible abscess vs mass > 5cm v mass Left groin on Duplex LE 09/20/21. Not seen on non-vascular ultrasound of L groin 5/1.  ? ?Plan ? - Monitor clinically ? ?Acute enephalopathy due to sepsis ? ?5/4 - unable to do eval due tsedation need. RASS currently -4 ? ?Plan ? - haldol prn (monitor Qtc) ? - nicotine patch ? - versed prn ?- diprivan gtt (monitor for diprivan infusion syndrome) ?- fen gtt ?- restraints ?-RAS goal 0 to -2 ? ?[place] Portal Vein thrombosis ? 5/3 moderate asciates on [place] ?Chronic microcytic anemia  - baseline hgb 9-10.5gm% ?Admitted with GI bleeed - hgb 4.6gm% ? - prbc on 5/2 and 5/3 for anemia of ICU ? ? ?5/4 - no bleeding. Hgb  > 7 ? ?Plan ?-- PRBC for hgb </= 6.9gm%   ? - exceptions are ?  -  if ACS susepcted/confirmed then transfuse for hgb </=  8.0gm%,  or  ? -  active bleeding with hemodynamic instability, then transfuse regardless of hemoglobin value ?  At at all times try to transfuse 1 unit prbc as possible with exception of active hemorrhage ? ?-cont ppi > 5/20, carafate > 5/4 ?-per GI not a candidate for full dose a/c  ? ?Thrombocytopenia - prob due to sepsis ? ?5/4  - improved to 94K ? ?Plan ? - monitpr ? ?Hypomagnesemia 09/24/21 ? ?Plan ? - replete ? ?Hypoglycemia ?-monitor and replace as able ? ?Homelessness  ?Polysubstance abuse ?Failure to thrive ?Severe protein calorie malnutrition ? ?Plan ?- case management aware ?- psych also following for IVC ?- tube feeds ?- nutrition consult ? ?Vitamin D Deficiency, mild ?- will need supplementation as outpatient ? ?  Best Practice (right click and "Reselect all SmartList Selections" daily)  ? ?Diet/type: tubefeeds ?DVT prophylaxis: other ortho recommended aspirin ?GI prophylaxis: PPI ?Lines: N/A ?Foley:  N/A ?Code Status:  full code ?Last date of multidisciplinary goals of care discussion: 4.30/23 by Pall care -> pall care recalled and seen 09/23/21 ? ? ? ? ?[place]  ? ?The patient SARIYA TRICKEY is critically ill with multiple organ systems failure and requires high complexity decision making for assessment and support, frequent evaluation and titration of therapies, application of advanced monitoring technologies and extensive interpretation of multiple databases.  ? ?Critical Care Time devoted to patient care services described in this note is  35  Minutes. This time reflects time of care of this signee Dr Brand Males. This critical care time does not reflect procedure time, or teaching time or supervisory time of PA/NP/Med student/Med Resident etc but could involve care discussion time  ? ? ? ?Dr. Brand Males, M.D., F.C.C.P ?Pulmonary and Critical Care Medicine ?Medical Director - [facility] ICU ?Staff Physician, [place] ?[facility] [facility] ?Pager: [phone], If no answer or between  15:00h - 7:00h: call [phone] ? ?09/24/2021 ?10:19 AM ? ? ?LABS  ? ? ?PULMONARY ?Recent Labs  ?Lab 09/18/21 ?0807 09/19/21 ?2032 09/19/21 ?2346 09/20/21 ?03

## 2021-09-25 ENCOUNTER — Inpatient Hospital Stay (HOSPITAL_COMMUNITY): Payer: Medicaid Other

## 2021-09-25 DIAGNOSIS — K750 Abscess of liver: Secondary | ICD-10-CM | POA: Diagnosis not present

## 2021-09-25 DIAGNOSIS — B3781 Candidal esophagitis: Secondary | ICD-10-CM | POA: Diagnosis not present

## 2021-09-25 DIAGNOSIS — I96 Gangrene, not elsewhere classified: Secondary | ICD-10-CM | POA: Diagnosis not present

## 2021-09-25 DIAGNOSIS — A4901 Methicillin susceptible Staphylococcus aureus infection, unspecified site: Secondary | ICD-10-CM | POA: Diagnosis not present

## 2021-09-25 DIAGNOSIS — J181 Lobar pneumonia, unspecified organism: Secondary | ICD-10-CM | POA: Diagnosis not present

## 2021-09-25 LAB — GLUCOSE, CAPILLARY
Glucose-Capillary: 104 mg/dL — ABNORMAL HIGH (ref 70–99)
Glucose-Capillary: 107 mg/dL — ABNORMAL HIGH (ref 70–99)
Glucose-Capillary: 65 mg/dL — ABNORMAL LOW (ref 70–99)
Glucose-Capillary: 86 mg/dL (ref 70–99)
Glucose-Capillary: 87 mg/dL (ref 70–99)
Glucose-Capillary: 91 mg/dL (ref 70–99)
Glucose-Capillary: 91 mg/dL (ref 70–99)
Glucose-Capillary: 94 mg/dL (ref 70–99)

## 2021-09-25 LAB — CBC
HCT: 23.6 % — ABNORMAL LOW (ref 36.0–46.0)
Hemoglobin: 7.9 g/dL — ABNORMAL LOW (ref 12.0–15.0)
MCH: 27.2 pg (ref 26.0–34.0)
MCHC: 33.5 g/dL (ref 30.0–36.0)
MCV: 81.4 fL (ref 80.0–100.0)
Platelets: 96 10*3/uL — ABNORMAL LOW (ref 150–400)
RBC: 2.9 MIL/uL — ABNORMAL LOW (ref 3.87–5.11)
RDW: 22.3 % — ABNORMAL HIGH (ref 11.5–15.5)
WBC: 6.1 10*3/uL (ref 4.0–10.5)
nRBC: 0 % (ref 0.0–0.2)

## 2021-09-25 LAB — COMPREHENSIVE METABOLIC PANEL
ALT: 12 U/L (ref 0–44)
AST: 26 U/L (ref 15–41)
Albumin: <1.5 g/dL — ABNORMAL LOW (ref 3.5–5.0)
Alkaline Phosphatase: 129 U/L — ABNORMAL HIGH (ref 38–126)
Anion gap: 5 (ref 5–15)
BUN: 27 mg/dL — ABNORMAL HIGH (ref 6–20)
CO2: 32 mmol/L (ref 22–32)
Calcium: 7 mg/dL — ABNORMAL LOW (ref 8.9–10.3)
Chloride: 102 mmol/L (ref 98–111)
Creatinine, Ser: <0.3 mg/dL — ABNORMAL LOW (ref 0.44–1.00)
Glucose, Bld: 106 mg/dL — ABNORMAL HIGH (ref 70–99)
Potassium: 3.5 mmol/L (ref 3.5–5.1)
Sodium: 139 mmol/L (ref 135–145)
Total Bilirubin: 0.5 mg/dL (ref 0.3–1.2)
Total Protein: 4.7 g/dL — ABNORMAL LOW (ref 6.5–8.1)

## 2021-09-25 LAB — PHOSPHORUS: Phosphorus: 3.6 mg/dL (ref 2.5–4.6)

## 2021-09-25 LAB — MAGNESIUM: Magnesium: 2 mg/dL (ref 1.7–2.4)

## 2021-09-25 MED ORDER — HYDRALAZINE HCL 20 MG/ML IJ SOLN
10.0000 mg | INTRAMUSCULAR | Status: DC | PRN
  Administered 2021-09-25 – 2021-10-10 (×18): 10 mg via INTRAVENOUS
  Filled 2021-09-25 (×18): qty 1

## 2021-09-25 MED ORDER — ALBUMIN HUMAN 25 % IV SOLN
12.5000 g | Freq: Three times a day (TID) | INTRAVENOUS | Status: DC
  Administered 2021-09-25 – 2021-09-26 (×3): 12.5 g via INTRAVENOUS
  Filled 2021-09-25 (×3): qty 50

## 2021-09-25 MED ORDER — CLONIDINE HCL 0.1 MG/24HR TD PTWK
0.1000 mg | MEDICATED_PATCH | TRANSDERMAL | Status: DC
  Administered 2021-09-25 – 2021-10-09 (×3): 0.1 mg via TRANSDERMAL
  Filled 2021-09-25 (×3): qty 1

## 2021-09-25 MED ORDER — VITAMIN D 25 MCG (1000 UNIT) PO TABS: 1000.0000 [IU] | ORAL_TABLET | Freq: Every day | ORAL | Status: DC

## 2021-09-25 MED ORDER — PANTOPRAZOLE 2 MG/ML SUSPENSION
40.0000 mg | Freq: Two times a day (BID) | ORAL | Status: DC
  Administered 2021-09-25: 40 mg
  Filled 2021-09-25: qty 20

## 2021-09-25 MED ORDER — CHLORHEXIDINE GLUCONATE 0.12 % MT SOLN
15.0000 mL | Freq: Two times a day (BID) | OROMUCOSAL | Status: DC
  Administered 2021-09-25 – 2021-10-07 (×12): 15 mL via OROMUCOSAL
  Filled 2021-09-25 (×22): qty 15

## 2021-09-25 MED ORDER — PANTOPRAZOLE SODIUM 40 MG PO TBEC
40.0000 mg | DELAYED_RELEASE_TABLET | Freq: Two times a day (BID) | ORAL | Status: DC
  Administered 2021-09-25 – 2021-09-26 (×3): 40 mg via ORAL
  Filled 2021-09-25 (×4): qty 1

## 2021-09-25 MED ORDER — VITAMIN D 25 MCG (1000 UNIT) PO TABS
1000.0000 [IU] | ORAL_TABLET | Freq: Every day | ORAL | Status: DC
  Administered 2021-09-25 – 2021-10-12 (×13): 1000 [IU] via ORAL
  Filled 2021-09-25 (×15): qty 1

## 2021-09-25 MED ORDER — ORAL CARE MOUTH RINSE
15.0000 mL | Freq: Two times a day (BID) | OROMUCOSAL | Status: DC
  Administered 2021-09-25 – 2021-10-07 (×5): 15 mL via OROMUCOSAL

## 2021-09-25 NOTE — Progress Notes (Signed)
Palliative care brief progress note ? ?I met briefly with Ms. Gaydos, her daughter, and her sister in conjunction with Marni Griffon from critical care. ? ?On my arrival to the room, critical care was already discussing that she had a long road to recovery before her, but at the same time she has been tolerating extubation to this point.  Discussed plan to continue conversation based upon her clinical course over the next several days. ? ?I let them know that I would be on-call this weekend and happy to discuss if there are further concerns that they have with which our team can be of assistance.  Otherwise, I will plan to check in with them 1 day this weekend. ? ?Micheline Rough, MD ?[place] Team ?[phone] ? ?No charge note ? ? ?

## 2021-09-25 NOTE — TOC Progression Note (Signed)
Transition of Care (TOC) - Progression Note  ? ? ?Patient Details  ?Name: Terri [name] ?MRN: [id number] ?Date of Birth: [birth year] ? ?Transition of Care (TOC) CM/SW Contact  ?Ross Ludwig, LCSW ?Phone Number: ?09/25/2021, 10:13 AM ? ?Clinical Narrative:    ? ?CSW talked to psych and attending regarding IVC status which expires today.  Per attending and psych, they will let IVC expire today.  If it needs to be completed at a later time, psych will have to be consulted again.  CSW to continue to follow patient's progress throughout discharge planning. ? ?Expected Discharge Plan: Home/Self Care ?Barriers to Discharge: Continued Medical Work up ? ?Expected Discharge Plan and Services ?Expected Discharge Plan: Home/Self Care ?  ?Discharge Planning Services: CM Consult ?  ?Living arrangements for the past 2 months: No permanent address ?                ?  ?  ?  ?  ?  ?  ?  ?  ?  ?  ? ? ?Social Determinants of Health (SDOH) Interventions ?  ? ?Readmission Risk Interventions ? ?  09/02/2021  ?  2:25 PM  ?Readmission Risk Prevention Plan  ?Transportation Screening Complete  ?Medication Review Press photographer) Complete  ?PCP or Specialist appointment within 3-5 days of discharge Complete  ?Palliative Care Screening Not Applicable  ?[place] Not Applicable  ? ? ?

## 2021-09-25 NOTE — Progress Notes (Signed)
eLink Physician-Brief Progress Note ?Patient Name: Terri [name] ?DOB: [birth year] ?MRN: [id number] ? ? ?Date of Service ? 09/25/2021  ?HPI/Events of Note ? Hypertension - BP = 146/105 with MAP = 116.   ?eICU Interventions ? Plan: ?Hydralazine 10 mg IV Q 4 hours PRN SBP > 160 or DBP > 100.  ? ? ? ?Intervention Category ?Major Interventions: Hypertension - evaluation and management ? ?Soloman Mckeithan Cornelia Copa ?09/25/2021, 11:02 PM ?

## 2021-09-25 NOTE — Progress Notes (Addendum)
? ?NAME:  Terri [name], MRN:  [id number], DOB:  07-Apr-1976, LOS: 24 ?ADMISSION DATE:  08/31/2021, CONSULTATION DATE:  09/19/21 ?REFERRING MD:  E link doctor, CHIEF COMPLAINT:  dark colored stool   ? ?BRIEF  ?46 yo female with pmh homelessness, recurrent gib, chronic microcytic anemia, htn, polysubstance abuse, roux en y presented to [facility] with reports of dark colored stools. Pt has history of chronic gib 2/2 peptic ulcers. She underwent endoscopy with large anastomotic ulceration noted and started on ppi. Hgb stabliized.  ? ?She was then found to have mssa bacteremia that appears to be disseminated. Her L great toe was noted to be gangrenous vascular surgery and orthopedics determined amputation would be best route for pt and this was done on 4/26. Cx from this was MRSA and she was broadened to vancomycin. She has multiple liver abscess noted and a history of portal v thrombosis that is reportedly stable. She has not had any a/c 2/2 GIB's.  ? ?She is being treated for 6 weeks of iv abx. Over the past two - three days pt has req increasing oxygen support ultimately req transfer to ICU. She was placed on bipap overnight and ccm was then consulted for evaluation for possible intubation.  ? ?All history is obtained from chart and RN as pt is not oriented for any discussion.  ? ?Pertinent  Medical History  ?Portal v thrombosis, polysubstance abuse, chronic anemia,  ? ?Significant Hospital Events: ?Including procedures, antibiotic start and stop dates in addition to other pertinent events   ?4/10 admitted ?4/13 egd for gib ?4/15 vascular surgery consulted for gangrenous L toe ?4/18 ID consulted for mssa infection, liver abscess, osteo, mrsa pneumonia ?4/23 Thoracentesis, culture negative, exudative ?4/26: ortho consulted for amputation of L great toe ?4/27 psych consulted for capacity, pt was placed on ivc ?4/28: transferred to ICU for increasing oxygen requirement ?4/29: ccm consulted  ?4/30 - Patient intubated  ?Palliative  care consult - full code ?5/1 - [place] groin left - no abscess, Moderate Ascites + ?5/2 - Faild SBT.  Remains on propofol and fentanyl.  ?S/p 1 unit probc ?Pall care consult ordeed ?ID consult - noted ?Eso candidadis - finished diflucan ?MRSA pna, great toe osteo s/p amputation - change vanco to teflaro ?Polymicrobial hepatic abscess -> change unasyn to teflaro ?09/23/21:  fever curve better. On vent Fio2 30%, Diprivan gtt, Fent gtt. Hgb again < 7gm% and 1 unit prbc given. On vent. Ex boyrfriend Fritz Pickerel at bedside ?Pallative care - reestablished ?09/24/21 - 40% fio2, afebrile since 09/22/21. WBC normal. +16L volume overload.  Higher sedation needs ?5/5: Passed spontaneous breathing trial extubated.  Diuresis continued, trialing albumin and Lasix ? ?Interim History / Subjective:  ? ? ?Objective   ?Blood pressure (Abnormal) 156/97, pulse 86, temperature 97.6 ?F (36.4 ?C), temperature source Oral, resp. rate 16, height '5\' 9"'$  (1.753 m), weight 71.4 kg, SpO2 93 %. ?   ?Vent Mode: PSV;CPAP ?FiO2 (%):  [30 %-40 %] 35 % ?Set Rate:  [18 bmp] 18 bmp ?Vt Set:  [530 mL] 530 mL ?PEEP:  [5 cmH20] 5 cmH20 ?Pressure Support:  [5 cmH20] 5 cmH20 ?Plateau Pressure:  [7 QPY19-50 cmH20] 15 cmH20  ? ?Intake/Output Summary (Last 24 hours) at 09/25/2021 0903 ?Last data filed at 09/25/2021 9326 ?Gross per 24 hour  ?Intake 2422.54 ml  ?Output 5875 ml  ?Net -3452.46 ml  ? ?Filed Weights  ? 09/23/21 0259 09/24/21 0500 09/25/21 0500  ?Weight: 73 kg 73.4 kg 71.4 kg  ? ? ?  General: This is a chronically ill malnourished cachectic 46 year old female who remains on mechanical ventilation ?HEENT temporal wasting mucous membranes dry orally intubated ?Pulmonary: Clear, diminished bases more so on the right, tidal volume in the 600-700 range on pressure support of 5 no accessory use appreciated portable chest x-ray personally reviewed shows ongoing right basilar airspace disease/effusion no significant change compared to prior film ?Cardiac: Regular rate and  rhythm ?Abdomen: Soft nontender ?Neuro: Awake, generalized and diffuse weakness, follows commands, interactive. ?Extremities: Warm, left foot dressing intact ?GU: Clear yellow ? ? ? ?Resolved Hospital Problem list   ?Septic Shock - - due to the above. - Off pressors since 5/1.  ? ?Assessment & Plan:  ? ?Principal Problem: ?  Disseminated MSSA with liver abscess, left foot osteo and abscess, 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 ?Active Problems: ?  Opioid abuse with opioid-induced mood disorder ([place]) ?  Leucocytosis ?  Polysubstance abuse ([place]) ?  Acute on chronic blood loss anemia ?  Multilobar pneumonia with acute hypoxic respiratory failure ([place]) ?  Portal vein thrombosis ?  Candida infection, esophageal ([place]) ?  Hepatic abscess ?  Swelling of left hand ?  Ascites ?  Exudative pleural effusion ?  Hypophosphatemia ?  Vaginal discharge ?  Acute respiratory failure with hypoxia ([place]) ?  Involuntary commitment ?  Protein-calorie malnutrition, severe ? ? ?Acute hypoxic hypercarbic resp failure  (negative duplex LE 4/30. Intubated 09/20/21) due to MRSA pneumonia ?Bilateral Pleural Effusions, R > L  ?-  Right thoracentesis performed 4/23, exudative based on pleural fluid studies, culture negative ?-Passed spontaneous breathing trial ?Plan ?Extubate ?Continue IV Lasix ?Supplemental oxygen ?May need to consider repeat right thoracentesis at some point, will reevaluate with ultrasound later today ? ? ?Volume overload ?Continues to have massive anasarca and total body overload.  Now 12 L positive, had been 14 L in the beginning of the week ?Plan ?Continue scheduled Lasix as long as BUN and creatinine tolerate, will try adding albumin prior to Lasix to see if this mobilizes fluid a bit better over the next 24 hours ? 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 ? ?L groin possible abscess vs mass > 5cm v mass Left groin on Duplex LE 09/20/21. Not seen on non-vascular ultrasound of L groin 5/1.  ?Plan ?Continue to monitor ? ?Acute enephalopathy due to sepsis, complicated further by chronic narcotic dependence.  Had been seen by psychiatry and deemed to not have capacity to make medical decisions ?Plan ?Plan ?Stopping sedation ?Narcotic patch ?As needed Haldol watching QTc ?I will start some low-dose clonidine ? ?History of Portal Vein thrombosis ? 5/3 moderate asciates on [place] ?Plan ?Not a candidate for anticoagulation given history of life-threatening bleed ? ? ?Acute GIB (POA) admitted w/ hgb 4.6 superimposed on chronic microcytic anemia (BL hgb 9-10.5) ?Hemoglobin is trended down overnight from 9.1-7.9 however there is been quite a bit of variation on serial hemoglobins, I do not see evidence of bleeding ?Plan ?Deemed not a candidate for full dose anticoagulation by GI ?Continuing PPI and Carafate ?A.m. CBC trigger for transfusion less than 7 ? ? ?Thrombocytopenia - prob due to sepsis->slowly improving  ?Plan ?Monitor ? ?Intermittent Fluid and electrolyte imbalance:  ?Plan ?Monitor and replace as indicated  ? ?Hypoglycemia ?Plan ?Follow  ? ?Homelessness  ?Polysubstance abuse ?Plan ?Psych was  following and recommended IVC; may need  to revisit capacity again prior to dc if cooperation becomes an issue  ?Palliative care team following ?CM following ? ? ?Severe protein calorie malnutrition & Failure to thrive ?RD following  ? ?Vitamin D Deficiency, mild ?Plan ?Supplementation  ? ? ?Best Practice (right click and "Reselect all SmartList Selections" daily)  ? ?Diet/type: tubefeeds ?DVT prophylaxis: other ortho recommended aspirin ?GI prophylaxis: PPI ?Lines: N/A ?Foley:  N/A ?Code Status:  full code ?Last date of multidisciplinary goals of care discussion: 4.30/23 by Pall care -> pall care recalled and seen 09/23/21 ? ?My cct 34 min  ? ?Erick Colace ACNP-BC ?[place] ?Pager # [phone] OR # [phone] if no answer ? ? ? ? ? ?

## 2021-09-25 NOTE — Progress Notes (Signed)
?   ? ?[facility] ? ?Date of Admission:  08/31/2021    ?       ?Reason for visit: Follow up on disseminated MSSA infection ? ?Current antibiotics: ?Ceftaroline ?Metronidazole ? ?ASSESSMENT:   ? ?46 y.o. female admitted with: ? ?Staph aureus pneumonia: Currently on ceftaroline after difficulties with vancomycin dosing where she was supratherapeutic ?Hepatic abscess: Status post aspiration with cultures growing MSSA and Bacteroides.  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 ?RECOMMENDATIONS:   ? ?Continue ceftaroline through the weekend to complete approximately 10 days of adequate Staph aureus pneumonia coverage (unclear if MSSA versus MRSA given discordance between her hepatic abscess and osteomyelitis cultures) ?Continue metronidazole ?On Monday, will likely transition to cefazolin ?Will follow. Dr Alphonsa Gin available as needed over the weekend for questions ? ? ?Principal Problem: ?  Disseminated MSSA with liver abscess, left foot osteo and abscess, 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 ?Active Problems: ?  Opioid abuse with opioid-induced mood disorder ([place]) ?  Leucocytosis ?  Polysubstance abuse ([place]) ?  Acute on chronic blood loss anemia ?  Multilobar pneumonia with acute hypoxic respiratory failure ([place]) ?  Portal vein thrombosis ?  Candida infection, esophageal ([place]) ?  Hepatic abscess ?  Swelling of left hand ?  Ascites ?  Exudative pleural effusion ?  Hypophosphatemia ?  Vaginal discharge ?  Acute respiratory failure with hypoxia ([place]) ?  Involuntary commitment ?  Protein-calorie malnutrition, severe ? ? ? ?MEDICATIONS:   ? ?Scheduled Meds: ? chlorhexidine gluconate (MEDLINE KIT)  15 mL Mouth Rinse BID  ? Chlorhexidine Gluconate Cloth  6 each Topical Daily  ? cholecalciferol  1,000 Units Oral Daily  ? cloNIDine  0.1 mg Transdermal Weekly  ?  folic acid  1 mg Per Tube Daily  ? furosemide  40 mg Intravenous Q8H  ? guaiFENesin  5 mL Per Tube Q6H  ? levalbuterol  0.63 mg Nebulization Q6H  ? lidocaine  1 patch Transdermal Daily  ? magnesium oxide  400 mg Per Tube BID  ? mouth rinse  15 mL Mouth Rinse 10 times per day  ? metroNIDAZOLE  500 mg Per Tube Q12H  ? multivitamin  15 mL Per Tube Daily  ? nicotine  21 mg Transdermal Daily  ? nutrition supplement (JUVEN)  1 packet Per Tube BID WC  ? pantoprazole sodium  40 mg Per Tube BID  ? potassium chloride  40 mEq Per Tube Daily  ? sodium chloride flush  10-40 mL Intracatheter Q12H  ? sucralfate  1 g Per Tube TID WC & HS  ? vitamin B-12  1,000 mcg Per Tube Daily  ? ?Continuous Infusions: ? sodium chloride    ? sodium chloride Stopped (09/25/21 0646)  ? sodium chloride    ? sodium chloride Stopped (09/24/21 1039)  ? albumin human    ? ceFTAROline [facility]) IV 100 mL/hr at 09/25/21 1209  ? dextrose 5 % and 0.45% NaCl Stopped (09/20/21 1244)  ? ?PRN Meds:.sodium chloride, acetaminophen (TYLENOL) oral liquid 160 mg/5 mL **OR** acetaminophen, dextrose, guaiFENesin-dextromethorphan, haloperidol lactate **AND** [DISCONTINUED] LORazepam, lip balm, loperamide HCl, naLOXone (NARCAN)  injection, ondansetron (ZOFRAN) IV, saline, sodium chloride flush ? ?SUBJECTIVE:  ? ?24 hour events:  ?No acute events ?Extubated this morning ?Afebrile, Tmax 98.3 ?Stable labs this morning ? ? ?Patient at the bedside with other staff members.  Her partner is also at the bedside.  She has been extubated. ? ?Review of Systems  ?All other systems reviewed and are negative. ? ?  ?OBJECTIVE:  ? ?Blood pressure (!) 173/103, pulse 94, temperature 97.9 ?F (36.6 ?C), temperature source Oral, resp. rate (!) 25, height 5' 9"  (1.753 m), weight 71.4 kg, SpO2 91 %. ?Body mass index is 23.25 kg/m?. ? ?Physical Exam ?Constitutional:   ?   Comments: Chronically ill-appearing woman, lying in bed, no acute distress  ?Eyes:  ?   Extraocular Movements: Extraocular  movements intact.  ?   Conjunctiva/sclera: Conjunctivae normal.  ?Abdominal:  ?   General: There is no distension.  ?   Palpations: Abdomen is soft.  ?Musculoskeletal:  ?   Cervical back: Normal range of motion and neck supple.  ?Skin: ?   General: Skin is warm and dry.  ?Neurological:  ?   General: No focal deficit present.  ?   Mental Status: She is alert and oriented to person, place, and time.  ?Psychiatric:     ?   Mood and Affect: Mood normal.     ?   Behavior: Behavior normal.  ? ? ? ?Lab Results: ?Lab Results  ?Component Value Date  ? WBC 6.1 09/25/2021  ? HGB 7.9 (L) 09/25/2021  ? HCT 23.6 (L) 09/25/2021  ? MCV 81.4 09/25/2021  ? PLT 96 (L) 09/25/2021  ?  ?Lab Results  ?Component Value Date  ? NA 139 09/25/2021  ? K 3.5 09/25/2021  ? CO2 32 09/25/2021  ? GLUCOSE 106 (H) 09/25/2021  ? BUN 27 (H) 09/25/2021  ? CREATININE <0.30 (L) 09/25/2021  ? CALCIUM 7.0 (L) 09/25/2021  ? GFRNONAA NOT CALCULATED 09/25/2021  ? GFRAA >60 07/15/2019  ?  ?Lab Results  ?Component Value Date  ? ALT 12 09/25/2021  ? AST 26 09/25/2021  ? ALKPHOS 129 (H) 09/25/2021  ? BILITOT 0.5 09/25/2021  ? ? ?   ?Component Value Date/Time  ? CRP 1.2 (H) 05/29/2021 0346  ? ? ?   ?Component Value Date/Time  ? ESRSEDRATE 20 02/26/2014 0958  ? ?  ?I have reviewed the micro and lab results in Epic. ? ?Imaging: ?DG CHEST PORT 1 VIEW ? ?Result Date: 09/25/2021 ?CLINICAL DATA:  Endotracheal tube position. EXAM: PORTABLE CHEST 1 VIEW COMPARISON:  Sep 24, 2021. FINDINGS: The heart size and mediastinal contours are within normal limits. Endotracheal and feeding tubes are unchanged in position. Right-sided PICC line is unchanged. Left lung is clear. Stable right basilar atelectasis or infiltrate is noted with associated pleural effusion. The visualized skeletal structures are unremarkable. IMPRESSION: Stable support apparatus.  Stable right basilar opacity. Electronically Signed   By: Marijo Conception M.D.   On: 09/25/2021 08:14  ? ?DG CHEST PORT 1  VIEW ? ?Result Date: 09/24/2021 ?CLINICAL DATA:  Ventilator dependent respiratory failure. EXAM: PORTABLE CHEST 1 VIEW COMPARISON:  09/22/2021. FINDINGS: 4:39 a.m., 09/24/2021. ETT tip is 3.5 cm from the carina. Feeding tube enters the stomach with radiopaque tip at the level of the gastric body. Right PICC terminates in the distal SVC. The cardiac size is normal. The mediastinum is unchanged with mild aortic atherosclerosis. Persistent moderate right and small left pleural effusions. Patchy opacities of the right lung fields, greatest in the lower lung field showing mild improvement today. On the left no focal infiltrate is seen. IMPRESSION: Improving right pulmonary opacities with no change in the right-greater-than-left pleural effusions. Electronically Signed   By: Telford Nab M.D.   On: 09/24/2021 07:26  ? ?US Abdomen  Limited RUQ (LIVER/GB) ? ?Result Date: 09/23/2021 ?CLINICAL DATA:  Ascites EXAM: ULTRASOUND ABDOMEN LIMITED RIGHT UPPER QUADRANT COMPARISON:  CT done on 09/18/2021 FINDINGS: Gallbladder: Gallbladder is not seen consistent with cholecystectomy Common bile duct: Diameter: 3 mm Liver: Space-occupying lesions seen in the liver in the CT done on 09/18/2021 are not visualized in the submitted images of liver. Portal vein is patent on color Doppler imaging with normal direction of blood flow towards the liver. Other: There is evidence of large right pleural effusion. Small to moderate ascites is noted adjacent to the liver. According to the note by the technologist examination was technically difficult as patient was unable to follow instructions for breath hold. IMPRESSION: Status post cholecystectomy.  There is no dilation of bile ducts. Large right pleural effusion.  Small to moderate ascites. Electronically Signed   By: Elmer Picker M.D.   On: 09/23/2021 15:49    ? ?Imaging independently reviewed in Epic.  ? ? ?Mignon Pine ?[facility] ?[place] ?[phone] pager ?09/25/2021, 12:58 PM ? ? ?

## 2021-09-25 NOTE — Progress Notes (Signed)
Nutrition Follow-up ? ?DOCUMENTATION CODES:  ? ?Severe malnutrition in context of acute illness/injury ? ?INTERVENTION:  ?- diet advancement as medically feasible. ? ?- if unable to advance diet in the next 24 hours and/or if unable to meet nutrition needs orally, recommend replace small bore NGT and re-start TF. ? ? ?NUTRITION DIAGNOSIS:  ? ?Severe Malnutrition related to acute illness (GIB, thrush, liver abscess) as evidenced by energy intake < or equal to 50% for > or equal to 5 days, moderate fat depletion, severe muscle depletion. -ongoing ? ?GOAL:  ? ?Patient will meet greater than or equal to 90% of their needs -unable to meet at this time.  ? ?MONITOR:  ? ?Diet advancement, Labs, Weight trends, Skin ? ?ASSESSMENT:  ? ?46 year old woman admitted for acute GIB. EGD showed esophageal plaques consistent with candidiasis, large cratered nonbleeding duodenal ulcer with no stigmata of bleeding.  CT angiogram on 08/31/2021 showed multiple low-attenuation lesions throughout the liver seen on recent prior exam, highly concerning for hepatic abscesses or metastasis.  Now s/p IR US guided bx and aspiration, revealing MSSA abscess. ? ?Patient was extubated this AM. Discussed in rounds this AM. Re-estimated nutrition needs. Small bore NGT has been removed. She remains NPO at this time.  ? ?Patient laying in bed with Fritz Pickerel (unable to determine relation despite asking) at bedside. Patient denies oral, esophageal, or abdominal pain, denies nausea.  ? ?Weight has been trending up since admission, but moderate pitting edema to BUE and perineal area and deep pitting edema to BLE documented in the edema section of flow sheet.  ? ? ?Labs reviewed; CBGs: 107, 104, 91, 86 mg/dl, BUN: 27 mg/dl, creatinine: <0.3 mg/dl, Ca: 7 mg/dl, Alk Phos elevated.  ? ?Medications reviewed; 12.5 g albumin TID, 1000 units cholecalciferol/day, 1 mg folvite/day, 40 mg IV lasix TID, imodium PRN, 400 mg mag-ox BID, 15 ml multivitamin/day, 40 mg protonix  BID, 40 mEq Klor-Con/day, 1 g carafate TID, 1000 mcg cyanocobalamin/day.  ? ?IVF; D5-1/2 NS @ 75 ml/hr (306 kcal/24 hrs). ? ? ? ?Diet Order:   ?Diet Order   ? ?       ?  Diet NPO time specified Except for: Sips with Meds  Diet effective midnight       ?  ? ?  ?  ? ?  ? ? ?EDUCATION NEEDS:  ? ?Not appropriate for education at this time ? ?Skin:  Skin Assessment: Skin Integrity Issues: ?Skin Integrity Issues:: DTI ?DTI: sacrum; L buttocks; vertebral column ?Other: left forefoot abscess, left great toe osteomyelitis ? ?Last BM:  5/5 (type 7 x2, small and large) ? ?Height:  ? ?Ht Readings from Last 1 Encounters:  ?09/18/21 5' 9"  (1.753 m)  ? ? ?Weight:  ? ?Wt Readings from Last 1 Encounters:  ?09/25/21 71.4 kg  ? ? ? ?BMI:  Body mass index is 23.25 kg/m?. ? ?Estimated Nutritional Needs:  ?Kcal:  2150-2350 kcal ?Protein:  100-115 grams ?Fluid:  >/= 2.2 L/day ? ? ? ? ?Jarome Matin, MS, RD, LDN ?Registered Dietitian II ?Inpatient Clinical Nutrition ?RD pager # and on-call/weekend pager # available in [place]  ? ?

## 2021-09-25 NOTE — Procedures (Signed)
Extubation Procedure Note ? ?Patient Details:   ?Name: [name] ?DOB: [birth year] ?MRN: [id number] ?  ?Airway Documentation:  ?  ?Vent end date: (not recorded) Vent end time: (not recorded)  ? ?Evaluation ? O2 sats: stable throughout ?Complications: No apparent complications ?Patient did tolerate procedure well. ?Bilateral Breath Sounds: Diminished ?  ?Yes ? ?Terri [name] ?09/25/2021, 9:26 AM ? ?

## 2021-09-26 ENCOUNTER — Inpatient Hospital Stay (HOSPITAL_COMMUNITY): Payer: Medicaid Other

## 2021-09-26 DIAGNOSIS — A4901 Methicillin susceptible Staphylococcus aureus infection, unspecified site: Secondary | ICD-10-CM | POA: Diagnosis not present

## 2021-09-26 LAB — BASIC METABOLIC PANEL
Anion gap: 11 (ref 5–15)
Anion gap: 10 (ref 5–15)
BUN: 29 mg/dL — ABNORMAL HIGH (ref 6–20)
BUN: 33 mg/dL — ABNORMAL HIGH (ref 6–20)
CO2: 33 mmol/L — ABNORMAL HIGH (ref 22–32)
CO2: 33 mmol/L — ABNORMAL HIGH (ref 22–32)
Calcium: 8.1 mg/dL — ABNORMAL LOW (ref 8.9–10.3)
Calcium: 8.4 mg/dL — ABNORMAL LOW (ref 8.9–10.3)
Chloride: 97 mmol/L — ABNORMAL LOW (ref 98–111)
Chloride: 98 mmol/L (ref 98–111)
Creatinine, Ser: 0.38 mg/dL — ABNORMAL LOW (ref 0.44–1.00)
Creatinine, Ser: 0.46 mg/dL (ref 0.44–1.00)
GFR, Estimated: >60 mL/min (ref >60)
GFR, Estimated: >60 mL/min (ref >60)
Glucose, Bld: 80 mg/dL (ref 70–99)
Glucose, Bld: 78 mg/dL (ref 70–99)
Potassium: 2.7 mmol/L — CL (ref 3.5–5.1)
Potassium: 3.2 mmol/L — ABNORMAL LOW (ref 3.5–5.1)
Sodium: 141 mmol/L (ref 135–145)
Sodium: 141 mmol/L (ref 135–145)

## 2021-09-26 LAB — GLUCOSE, CAPILLARY
Glucose-Capillary: 91 mg/dL (ref 70–99)
Glucose-Capillary: 82 mg/dL (ref 70–99)
Glucose-Capillary: 80 mg/dL (ref 70–99)
Glucose-Capillary: 87 mg/dL (ref 70–99)
Glucose-Capillary: 79 mg/dL (ref 70–99)
Glucose-Capillary: 80 mg/dL (ref 70–99)

## 2021-09-26 LAB — BLOOD GAS, ARTERIAL
Acid-Base Excess: 14.9 mmol/L — ABNORMAL HIGH (ref 0.0–2.0)
Bicarbonate: 36.4 mmol/L — ABNORMAL HIGH (ref 20.0–28.0)
O2 Saturation: 93.3 %
Patient temperature: 37
pCO2 arterial: 33 mmHg (ref 32–48)
pH, Arterial: 7.65 (ref 7.35–7.45)
pO2, Arterial: 59 mmHg — ABNORMAL LOW (ref 83–108)

## 2021-09-26 LAB — CBC
HCT: 28.6 % — ABNORMAL LOW (ref 36.0–46.0)
Hemoglobin: 9.4 g/dL — ABNORMAL LOW (ref 12.0–15.0)
MCH: 25.9 pg — ABNORMAL LOW (ref 26.0–34.0)
MCHC: 32.9 g/dL (ref 30.0–36.0)
MCV: 78.8 fL — ABNORMAL LOW (ref 80.0–100.0)
Platelets: 142 10*3/uL — ABNORMAL LOW (ref 150–400)
RBC: 3.63 MIL/uL — ABNORMAL LOW (ref 3.87–5.11)
RDW: 21.4 % — ABNORMAL HIGH (ref 11.5–15.5)
WBC: 8.3 10*3/uL (ref 4.0–10.5)
nRBC: 0 % (ref 0.0–0.2)

## 2021-09-26 MED ORDER — ALBUMIN HUMAN 25 % IV SOLN
12.5000 g | Freq: Three times a day (TID) | INTRAVENOUS | Status: AC
  Administered 2021-09-26 – 2021-09-27 (×3): 12.5 g via INTRAVENOUS
  Filled 2021-09-26 (×3): qty 50

## 2021-09-26 MED ORDER — POTASSIUM CHLORIDE 10 MEQ/100ML IV SOLN
10.0000 meq | INTRAVENOUS | Status: AC
  Administered 2021-09-26 (×4): 10 meq via INTRAVENOUS
  Filled 2021-09-26 (×4): qty 100

## 2021-09-26 MED ORDER — FUROSEMIDE 10 MG/ML IJ SOLN
40.0000 mg | Freq: Two times a day (BID) | INTRAMUSCULAR | Status: DC
Start: 2021-09-26 — End: 2021-09-26
  Administered 2021-09-26: 40 mg via INTRAVENOUS
  Filled 2021-09-26: qty 4

## 2021-09-26 MED ORDER — STERILE WATER FOR INJECTION IJ SOLN
INTRAMUSCULAR | Status: AC
  Administered 2021-09-26: 10 mL
  Filled 2021-09-26: qty 10

## 2021-09-26 MED ORDER — FUROSEMIDE 10 MG/ML IJ SOLN: 40.0000 mg | Freq: Once | INTRAMUSCULAR | Status: DC

## 2021-09-26 MED ORDER — POTASSIUM CHLORIDE 10 MEQ/50ML IV SOLN
10.0000 meq | INTRAVENOUS | Status: AC
  Administered 2021-09-26 (×6): 10 meq via INTRAVENOUS
  Filled 2021-09-26 (×7): qty 50

## 2021-09-26 MED ORDER — ACETAZOLAMIDE SODIUM 500 MG IJ SOLR
250.0000 mg | Freq: Once | INTRAMUSCULAR | Status: AC
  Administered 2021-09-26: 250 mg via INTRAVENOUS
  Filled 2021-09-26 (×2): qty 250

## 2021-09-26 NOTE — Progress Notes (Signed)
eLink Physician-Brief Progress Note ?Patient Name: Terri [name] ?DOB: 11-05-1975 ?MRN: [id number] ? ? ?Date of Service ? 09/26/2021  ?HPI/Events of Note ? Hypokalemia - K+ = 2.7 and Creatinine = 0.38.  ?eICU Interventions ? Will replace K+.   ? ? ? ?Intervention Category ?Major Interventions: Electrolyte abnormality - evaluation and management ? ?[name] ?09/26/2021, 5:35 AM ?

## 2021-09-26 NOTE — Evaluation (Signed)
Physical Therapy Evaluation ?Patient Details ?Name: Terri [name] ?MRN: [id number] ?DOB: 1976-05-13 ?Today's Date: 09/26/2021 ? ?History of Present Illness ? 46 yo female with pmh homelessness, recurrent gib, chronic microcytic anemia, htn, polysubstance abuse, roux en y presented to [facility] 08/31/21 with reports of dark colored stools. 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  ?Clinical Impression ? Patient is alert and able to participate in  low level A/AAROM  exercises in bed. Placed in bed chair position to work on balance and  trunk and head control. ? Patient is profoundly deconditioned. Has been in hospital since  09/18/21. ?HR 96-120's, on 6 L HFNC, RR 29-35, SPO2 >94%., BP 145/97 when seated in bed chair position. ?   ? ?Recommendations for follow up therapy are one component of a multi-disciplinary discharge planning process, led by the attending physician.  Recommendations may be updated based on patient status, additional functional criteria and insurance authorization. ? ?Follow Up Recommendations Skilled nursing-short term rehab (<3 hours/day) ? ?  ?Assistance Recommended at Discharge  Frequent/ constant assistance  ?Patient can return home with the following ? Two people to help with walking and/or transfers;Assistance with cooking/housework;Direct supervision/assist for medications management;Assist for transportation;Two people to help with bathing/dressing/bathroom;Assistance with feeding;Help with stairs or ramp for entrance ? ?  ?Equipment Recommendations None recommended by PT  ?Recommendations for Other Services ?    ?  ?Functional Status Assessment Patient has had a recent decline in their functional status and demonstrates the ability to make significant improvements in function in a reasonable and predictable amount of time.  ? ?  ?Precautions / Restrictions Precautions ?Precautions: Fall ?Precaution Comments: flexiseal, cachectic  ? ?   ? ?Mobility ? Bed Mobility ?Overal bed mobility: Needs Assistance ?  ?  ?  ?  ?  ?  ?General bed mobility comments: placed in  bed chair position to work on leaning forward and head/neck control, Facilitated leaning forward and reaching towards feet. ?  ? ?Transfers ?  ?  ?  ?  ?  ?  ?  ?  ?  ?General transfer comment: will need a mechanical lift ?  ? ?Ambulation/Gait ?  ?  ?  ?  ?  ?  ?  ?  ? ?Stairs ?  ?  ?  ?  ?  ? ?Wheelchair Mobility ?  ? ?Modified Rankin (Stroke Patients Only) ?  ? ?  ? ?Balance Overall balance assessment: Needs assistance ?Sitting-balance support: Feet supported, Bilateral upper extremity supported ?Sitting balance-Leahy Scale: Zero ?Sitting balance - Comments: unable to hold trunk  in sitting upright. ?  ?  ?  ?  ?  ?  ?  ?  ?  ?  ?  ?  ?  ?  ?  ?   ? ? ? ?Pertinent Vitals/Pain Pain Assessment ?Pain Assessment: No/denies pain  ? ? ?Home Living Family/patient expects to be discharged to:: Unsure ?  ?Available Help at Discharge: Family;Friend(s);Available PRN/intermittently ?Type of Home: House ?Home Access: Stairs to enter ?  ?  ?  ?Home Layout: Two level ?Home Equipment: None ?Additional Comments: unsure if info patient gave is correct- patient reports her daughter and sisters will assist at DC.  ?  ?Prior Function Prior Level of Function : Independent/Modified Independent ?  ?  ?  ?  ?  ?  ?  ?  ?  ? ? ?Hand Dominance  ? Dominant Hand: Right ? ?  ?  Extremity/Trunk Assessment  ? Upper Extremity Assessment ?Upper Extremity Assessment: Generalized weakness (able to lift arms to shoulder level, able toweakly hold tissue to wipe nose) ?  ? ?Lower Extremity Assessment ?Lower Extremity Assessment: RLE deficits/detail;Defer to PT evaluation;LLE deficits/detail ?RLE Deficits / Details: grossly 2 hip flex , 2+ knee extension, 2 dorsiflexion ?LLE Deficits / Details: foot has dressing intact, strength grossly same as right ?  ? ?Cervical / Trunk Assessment ?Cervical / Trunk Assessment: Kyphotic;Other  exceptions ?Cervical / Trunk Exceptions: cachectic, head tends to hang forward, able to lift head to neatral and hold, gradually fwill return to flexed position  ?Communication  ? Communication:  (very soft voice)  ?Cognition Arousal/Alertness: Awake/alert ?Behavior During Therapy: Flat affect ?Overall Cognitive Status: No family/caregiver present to determine baseline cognitive functioning ?Area of Impairment: Orientation, Attention, Following commands, Awareness ?  ?  ?  ?  ?  ?  ?  ?  ?Orientation Level: Time, Situation ?Current Attention Level: Sustained ?  ?Following Commands: Follows one step commands consistently ?  ?Awareness: Emergent ?  ?General Comments: oriented  to [facility] ?  ?  ? ?  ?General Comments   ? ?  ?Exercises General Exercises - Lower Extremity ?Ankle Circles/Pumps: AROM, Both, 10 reps ?Long Arc Quad: AAROM, Both, 5 reps ?Heel Slides: AAROM, Both, 5 reps  ? ?Assessment/Plan  ?  ?PT Assessment Patient needs continued PT services  ?PT Problem List Decreased strength;Decreased mobility;Decreased safety awareness;Decreased knowledge of precautions;Decreased activity tolerance;Decreased cognition;Cardiopulmonary status limiting activity;Decreased balance;Decreased knowledge of use of DME ? ?   ?  ?PT Treatment Interventions Therapeutic activities;Cognitive remediation;Therapeutic exercise;Patient/family education;Functional mobility training;Balance training   ? ?PT Goals (Current goals can be found in the Care Plan section)  ?Acute Rehab PT Goals ?Patient Stated Goal: to get OOB- friend in room ?PT Goal Formulation: With patient/family ?Time For Goal Achievement: 10/10/21 ?Potential to Achieve Goals: Fair ? ?  ?Frequency Min 2X/week ?  ? ? ?Co-evaluation   ?  ?  ?  ?  ? ? ?  ?AM-PAC PT "6 Clicks" Mobility  ?Outcome Measure Help needed turning from your back to your side while in a flat bed without using bedrails?: Total ?Help needed moving from lying on your back to sitting on the side of a flat bed  without using bedrails?: Total ?Help needed moving to and from a bed to a chair (including a wheelchair)?: Total ?Help needed standing up from a chair using your arms (e.g., wheelchair or bedside chair)?: Total ?Help needed to walk in hospital room?: Total ?Help needed climbing 3-5 steps with a railing? : Total ?6 Click Score: 6 ? ?  ?End of Session Equipment Utilized During Treatment: Oxygen ?Activity Tolerance: Patient limited by fatigue;Patient tolerated treatment well ?Patient left: in bed;with call bell/phone within reach ?Nurse Communication: Mobility status ?PT Visit Diagnosis: Unsteadiness on feet (R26.81);Muscle weakness (generalized) (M62.81);Difficulty in walking, not elsewhere classified (R26.2);Adult, failure to thrive (R62.7) ?  ? ?Time: 4431-5400 ?PT Time Calculation (min) (ACUTE ONLY): 39 min ? ? ?Charges:   PT Evaluation ?$PT Eval Low Complexity: 1 Low ?PT Treatments ?$Therapeutic Exercise: 8-22 mins ?$Therapeutic Activity: 8-22 mins ?  ?   ?[name] PT ?Acute Rehabilitation Services ?Pager [phone] ?Office [phone] ? ?Claretha Cooper ?09/26/2021, 1:59 PM ? ?

## 2021-09-26 NOTE — Progress Notes (Addendum)
? ?NAME:  Terri [name], MRN:  [id number], DOB:  05-19-1976, LOS: 55 ?ADMISSION DATE:  08/31/2021, CONSULTATION DATE:  09/19/21 ?REFERRING MD:  E link doctor, CHIEF COMPLAINT:  dark colored stool   ? ?BRIEF  ?46 yo female with pmh homelessness, recurrent gib, chronic microcytic anemia, htn, polysubstance abuse, roux en y presented to [facility] with reports of dark colored stools. Pt has history of chronic gib 2/2 peptic ulcers. She underwent endoscopy with large anastomotic ulceration noted and started on ppi. Hgb stabliized.  ? ?She was then found to have mssa bacteremia that appears to be disseminated. Her L great toe was noted to be gangrenous vascular surgery and orthopedics determined amputation would be best route for pt and this was done on 4/26. Cx from this was MRSA and she was broadened to vancomycin. She has multiple liver abscess noted and a history of portal v thrombosis that is reportedly stable. She has not had any a/c 2/2 GIB's.  ? ?She is being treated for 6 weeks of iv abx. Over the past two - three days pt has req increasing oxygen support ultimately req transfer to ICU. She was placed on bipap overnight and ccm was then consulted for evaluation for possible intubation.  ? ?All history is obtained from chart and RN as pt is not oriented for any discussion.  ? ?Pertinent  Medical History  ?Portal v thrombosis, polysubstance abuse, chronic anemia,  ? ?Significant Hospital Events: ?Including procedures, antibiotic start and stop dates in addition to other pertinent events   ?4/10 admitted ?4/13 egd for gib ?4/15 vascular surgery consulted for gangrenous L toe ?4/18 ID consulted for mssa infection, liver abscess, osteo, mrsa pneumonia ?4/23 Thoracentesis, culture negative, exudative ?4/26: ortho consulted for amputation of L great toe ?4/27 psych consulted for capacity, pt was placed on ivc ?4/28: transferred to ICU for increasing oxygen requirement ?4/29: ccm consulted  ?4/30 - Patient intubated  ?Palliative  care consult - full code ?5/1 - [place] groin left - no abscess, Moderate Ascites + ?5/2 - Faild SBT.  Remains on propofol and fentanyl.  ?S/p 1 unit probc ?Pall care consult ordeed ?ID consult - noted ?Eso candidadis - finished diflucan ?MRSA pna, great toe osteo s/p amputation - change vanco to teflaro ?Polymicrobial hepatic abscess -> change unasyn to teflaro ?09/23/21:  fever curve better. On vent Fio2 30%, Diprivan gtt, Fent gtt. Hgb again < 7gm% and 1 unit prbc given. On vent. Ex boyrfriend Fritz Pickerel at bedside ?Pallative care - reestablished ?09/24/21 - 40% fio2, afebrile since 09/22/21. WBC normal. +16L volume overload.  Higher sedation needs ?5/5: Passed spontaneous breathing trial extubated.  Diuresis continued, trialing albumin and Lasix ? ?Interim History / Subjective:  ? ?Extubated on 5/5.  Stable overnight ? ?Objective   ?Blood pressure (!) 129/100, pulse 100, temperature 98 ?F (36.7 ?C), temperature source Oral, resp. rate (!) 34, height '5\' 9"'$  (1.753 m), weight 61.8 kg, SpO2 94 %. ?   ?   ? ?Intake/Output Summary (Last 24 hours) at 09/26/2021 0913 ?Last data filed at 09/26/2021 0800 ?Gross per 24 hour  ?Intake 797.42 ml  ?Output 4300 ml  ?Net -3502.58 ml  ? ?Filed Weights  ? 09/24/21 0500 09/25/21 0500 09/26/21 0500  ?Weight: 73.4 kg 71.4 kg 61.8 kg  ? ? ?Blood pressure (!) 129/100, pulse 100, temperature 98 ?F (36.7 ?C), temperature source Oral, resp. rate (!) 34, height '5\' 9"'$  (1.753 m), weight 61.8 kg, SpO2 94 %. ?Gen:      No  acute distress, frail, cachectic ?HEENT:  EOMI, sclera anicteric ?Neck:     No masses; no thyromegaly ?Lungs:    Clear to auscultation bilaterally; normal respiratory effort ?CV:         Regular rate and rhythm; no murmurs ?Abd:      + bowel sounds; soft, non-tender; no palpable masses, no distension ?Ext:    No edema; adequate peripheral perfusion ?Skin:      Warm and dry; no rash ?Neuro: alert and oriented x 3 ?Psych: normal mood and affect  ? ?Imaging reviewed ?Significant for potassium 2.7,  creatinine 0.38 ?Labs are stable ?No new imaging ? ?Resolved Hospital Problem list   ?Septic Shock - - due to the above. - Off pressors since 5/1.  ? ?Assessment & Plan:  ? ?Principal Problem: ?  Disseminated MSSA with liver abscess, left foot osteo and abscess, 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 ?Active Problems: ?  Opioid abuse with opioid-induced mood disorder ([place]) ?  Leucocytosis ?  Polysubstance abuse ([place]) ?  Acute on chronic blood loss anemia ?  Multilobar pneumonia with acute hypoxic respiratory failure ([place]) ?  Portal vein thrombosis ?  Candida infection, esophageal ([place]) ?  Hepatic abscess ?  Swelling of left hand ?  Ascites ?  Exudative pleural effusion ?  Hypophosphatemia ?  Vaginal discharge ?  Acute respiratory failure with hypoxia ([place]) ?  Involuntary commitment ?  Protein-calorie malnutrition, severe ? ? ?Acute hypoxic hypercarbic resp failure  (negative duplex LE 4/30. Intubated 09/20/21) due to MRSA pneumonia ?Bilateral Pleural Effusions, R > L  ?-  Right thoracentesis performed 4/23, exudative based on pleural fluid studies, culture negative ?-Passed spontaneous breathing trial ?Plan ?Stable postextubation ?Continue IV Lasix ?Supplemental oxygen ?Monitor right effusion with chest x-ray ? ?Volume overload ?Continues to have massive anasarca and total body overload.  Now 12 L positive, had been 14 L in the beginning of the week ?Plan ?Repeat Lasix, albumin to mobilize fluid ? 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 ?L groin possible abscess vs mass > 5cm v mass Left groin on Duplex LE 09/20/21. Not seen on non-vascular ultrasound of L groin 5/1.  ?Plan ?Continue to monitor ? ?Acute enephalopathy due to sepsis, complicated further by chronic narcotic dependence.  Had been seen by psychiatry and deemed to not have capacity to make  medical decisions ?Plan ?Plan ?Stopping sedation ?Narcotic patch ?As needed Haldol watching QTc ?Continue low-dose clonidine ? ?History of Portal Vein thrombosis ? 5/3 moderate asciates on [place] ?Plan ?Not a candidate for anticoagulation given history of life-threatening bleed ? ? ?Acute GIB (POA) admitted w/ hgb 4.6 superimposed on chronic microcytic anemia (BL hgb 9-10.5) ?No evidence of ongoing bleeding ?Plan ?Continuing PPI and Carafate ?A.m. CBC trigger for transfusion less than 7 ? ?Thrombocytopenia - prob due to sepsis->slowly improving  ?Plan ?Monitor ? ?Intermittent Fluid and electrolyte imbalance:  ?Plan ?Monitor and replace as indicated  ? ?Hypoglycemia ?Plan ?Follow  ? ?Homelessness  ?Polysubstance abuse ?Plan ?Psych was  following and recommended IVC; may need to revisit capacity again prior to dc if cooperation becomes an issue  ?Palliative care team following ?CM following ? ? ?Severe protein calorie malnutrition & Failure to thrive ?RD following  ?N.p.o. for now.  Will need speech and swallow to see before giving p.o. diet. ? ?Vitamin D Deficiency, mild ?Plan ?Supplementation  ? ?We  will transfer to hospitalist service for further management.  PCCM will be available as needed starting 5/7 ? ?Best Practice (right click and "Reselect all SmartList Selections" daily)  ? ?Diet/type: tubefeeds ?DVT prophylaxis: other ortho recommended aspirin ?GI prophylaxis: PPI ?Lines: N/A ?Foley:  N/A ?Code Status:  full code ?Last date of multidisciplinary goals of care discussion: 4.30/23 by Pall care -> pall care recalled and seen 09/23/21 ? ?Marshell Garfinkel MD ?[facility] ?See Amion for pager ? ?If no response to pager , please call [phone] until 7pm ?After 7:00 pm call Elink  [phone] ?09/26/2021, 9:17 AM  ? ? ? ?

## 2021-09-26 NOTE — Evaluation (Signed)
Clinical/Bedside Swallow Evaluation ?Patient Details  ?Name: Terri [name] ?MRN: [id number] ?Date of Birth: 18-Sep-1975 ? ?Today's Date: 09/26/2021 ?Time: SLP Start Time (ACUTE ONLY): 8338 SLP Stop Time (ACUTE ONLY): 1550 ?SLP Time Calculation (min) (ACUTE ONLY): 20 min ? ?Past Medical History:  ?Past Medical History:  ?Diagnosis Date  ? Arthritis   ? Back and legs  ? Chronic back pain   ? DVT (deep venous thrombosis) ([place])   ? early 20's leg  ? GIB (gastrointestinal bleeding) 09/13/2017  ? History of blood transfusion   ? Hypertension   ? Migraine headache   ? Mood disorder ([place])   ? previously documented as bipolar and MDD  ? Neuropathy   ? Pneumonia   ? 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 Sciatica   ? Stress incontinence   ? ?Past Surgical History:  ?Past Surgical History:  ?Procedure Laterality Date  ? ALVEOLOPLASTY Bilateral 01/31/2018  ? Procedure: ALVEOLOPLASTY;  Surgeon: Diona Browner, DDS;  Location: [place];  Service: Oral Surgery;  Laterality: Bilateral;  ? AMPUTATION Left 09/16/2021  ? Procedure: AMPUTATION OF LEFT GREAT TOE;  Surgeon: Armond Hang, MD;  [facility];  Service: Orthopedics;  Laterality: Left;  ? ANKLE SURGERY Left 1992  ? with hardware  ? BACK SURGERY    ? BIOPSY  02/08/2019  ? Procedure: BIOPSY;  Surgeon: Irving Copas., MD;  Location: [facility];  Service: Gastroenterology;;  ? CESAREAN SECTION    ? CHOLECYSTECTOMY N/A 02/15/2017  ? Procedure: LAPAROSCOPIC CHOLECYSTECTOMY;  Surgeon: Clovis Riley, MD;  Location: [facility];  Service: General;  Laterality: N/A;  ? ENTEROSCOPY N/A 02/08/2019  ? Procedure: ENTEROSCOPY;  Surgeon: Rush Landmark Telford Nab., MD;  Location: [facility];  Service: Gastroenterology;  Laterality: N/A;  ? ESOPHAGOGASTRODUODENOSCOPY N/A 01/17/2021  ? Procedure: ESOPHAGOGASTRODUODENOSCOPY (EGD);  Surgeon: Juanita Craver, MD;  Location: [facility];  Service: Endoscopy;  Laterality: N/A;  ? ESOPHAGOGASTRODUODENOSCOPY N/A 09/03/2021  ? Procedure:  ESOPHAGOGASTRODUODENOSCOPY (EGD);  Surgeon: Arta Silence, MD;  Location: [facility];  Service: Gastroenterology;  Laterality: N/A;  ? ESOPHAGOGASTRODUODENOSCOPY (EGD) WITH PROPOFOL N/A 09/15/2017  ? Procedure: ESOPHAGOGASTRODUODENOSCOPY (EGD) WITH PROPOFOL;  Surgeon: Clarene Essex, MD;  [facility];  Service: Endoscopy;  Laterality: N/A;  ? ESOPHAGOGASTRODUODENOSCOPY (EGD) WITH PROPOFOL N/A 02/08/2019  ? Procedure: ESOPHAGOGASTRODUODENOSCOPY (EGD) WITH PROPOFOL;  Surgeon: Rush Landmark Telford Nab., MD;  Location: [facility];  Service: Gastroenterology;  Laterality: N/A;  ? GASTRIC BYPASS    ? INCISION AND DRAINAGE OF PERITONSILLAR ABCESS Left 08/13/2017  ? Procedure: INCISION AND DRAINAGE OF LEFT NECK ABSCESS;  Surgeon: Melida Quitter, MD;  Location[facility];  Service: ENT;  Laterality: Left;  ? LAPAROSCOPIC LYSIS OF ADHESIONS  02/15/2017  ? Procedure: LAPAROSCOPIC LYSIS OF ADHESIONS;  Surgeon: Clovis Riley, MD;  Location: [facility];  Service: General;;  ? LUMBAR [place]    ? LUMBAR FUSION    ? SAVORY DILATION N/A 02/08/2019  ? Procedure: SAVORY DILATION;  Surgeon: Irving Copas., MD;  Location: [facility];  Service: Gastroenterology;  Laterality: N/A;  ? TOOTH EXTRACTION Bilateral 01/31/2018  ? Procedure: DENTAL RESTORATION/EXTRACTIONS;  Surgeon: Diona Browner, DDS;  Location: [place];  Service: Oral Surgery;  Laterality: Bilateral;  ? TUBAL LIGATION    ? UPPER GI ENDOSCOPY N/A 02/15/2017  ? Procedure: UPPER GI ENDOSCOPY;  Surgeon: Clovis Riley, MD;  Location[facility];  Service: General;  Laterality: N/A;  ? ?HPI:  ?Gastroenterology was consulted, EGD on 09/03/2021 showed  esophageal plaques consistent with candidiasis, large cratered nonbleeding duodenal ulcer with no stigmata of bleeding.  bx 4/17 showed acute inflammation with necrosis compatible with abscess, granulomatous inflammation with eosinophilia and fragment of foreign material, negative for malignancy;  CXR Grossly stable right  upper lobe pneumonia. Stable mild left basilar atelectasis or infiltrate., incr ox needs thus TEE deferred, 4/23  thora and s/p paracentesis 4/17; ? Endocarditis; cocaine +. Bedside swallow evaluation completed by SLP on 09/15/21 and patient's swallow at that time appeared largely [facility] and no f/u recommended. Patient had to be intubated on 4/30 secondary to tachypneic with accessory muscle use and increased WOB. She was extubated on 09/25/21 and has been NPO awaiting SLP swallow evaluation.  ?  ?Assessment / Plan / Recommendation  ?Clinical Impression ? Patient presenting with clinical s/s of dysphagia as per this bedside/clinical swallow evaluation. Suspect dysphagia is secondary to s/p 6 day intubation. At rest, patient's RR was in high 20's to low 30's and no change observed during or after PO intake. Patient exhibited suspected delayed swallow initiation with at times incomplete laryngeal elevation with sips of thin liquids (water) and puree solids (applesauce). She exhibited immediate and mildly delayed congested, weak cough response after sips of water. As patient was extubated previous date and is showing clinical s/s of dysphagia, recommending to continue NPO status and plan for objective swallow study next 1-2 dates (MBS). Patient in agreement with MBS when SLP described it as well as reasoning for it. ?SLP Visit Diagnosis: Dysphagia, unspecified (R13.10) ?   ?Aspiration Risk ? Moderate aspiration risk  ?  ?Diet Recommendation NPO  ? ?Medication Administration: Via alternative means  ?  ?Other  Recommendations     ? ?Recommendations for follow up therapy are one component of a multi-disciplinary discharge planning process, led by the attending physician.  Recommendations may be updated based on patient status, additional functional criteria and insurance authorization. ? ?Follow up Recommendations Other (comment) (TBD)  ? ? ?  ?Assistance Recommended at Discharge Frequent or constant Supervision/Assistance   ?Functional Status Assessment Patient has not had a recent decline in their functional status  ?Frequency and Duration min 2x/week  ?1 week ?  ?   ? ?Prognosis Prognosis for Safe Diet Advancement: Good  ? ?  ? ?Swallow Study   ?General Date of Onset: 09/15/21 ?HPI: Gastroenterology was consulted, EGD on 09/03/2021 showed esophageal plaques consistent with candidiasis, large cratered nonbleeding duodenal ulcer with no stigmata of bleeding.  bx 4/17 showed acute inflammation with necrosis compatible with abscess, granulomatous inflammation with eosinophilia and fragment of foreign material, negative for malignancy;  CXR Grossly stable right upper lobe pneumonia. Stable mild left basilar atelectasis or infiltrate., incr ox needs thus TEE deferred, 4/23  thora and s/p paracentesis 4/17; ? Endocarditis; cocaine +. Bedside swallow evaluation completed by SLP on 09/15/21 and patient's swallow at that time appeared largely [facility] and no f/u recommended. Patient had to be intubated on 4/30 secondary to tachypneic with accessory muscle use and increased WOB. She was extubated on 09/25/21 and has been NPO awaiting SLP swallow evaluation. ?Type of Study: Bedside Swallow Evaluation ?Previous Swallow Assessment: 09/15/21 ?Diet Prior to this Study: NPO ?Temperature Spikes Noted: No ?Respiratory Status: Nasal cannula ?History of Recent Intubation: Yes ?Length of Intubations (days): 6 days ?Date extubated: 09/25/21 ?Behavior/Cognition: Alert;Cooperative;Pleasant mood ?Oral Cavity Assessment: Within Functional Limits ?Oral Care Completed by SLP: No ?Oral Cavity - Dentition: Edentulous ?Vision: Functional for self-feeding ?Self-Feeding Abilities: Total assist ?Patient Positioning: Upright in bed ?  Baseline Vocal Quality: Low vocal intensity ?Volitional Cough: Congested;Weak ?Volitional Swallow: Able to elicit  ?  ?Oral/Motor/Sensory Function Overall Oral Motor/Sensory Function: Within functional limits   ?Ice Chips     ?Thin Liquid Thin  Liquid: Impaired ?Pharyngeal  Phase Impairments: Cough - Delayed;Cough - Immediate;Suspected delayed Swallow  ?  ?Nectar Thick     ?Honey Thick     ?Puree Puree: Impaired ?Pharyngeal Phase Impairments: Suspected delayed

## 2021-09-27 ENCOUNTER — Inpatient Hospital Stay (HOSPITAL_COMMUNITY): Payer: Medicaid Other

## 2021-09-27 DIAGNOSIS — A4901 Methicillin susceptible Staphylococcus aureus infection, unspecified site: Secondary | ICD-10-CM | POA: Diagnosis not present

## 2021-09-27 LAB — GLUCOSE, CAPILLARY
Glucose-Capillary: 76 mg/dL (ref 70–99)
Glucose-Capillary: 74 mg/dL (ref 70–99)
Glucose-Capillary: 68 mg/dL — ABNORMAL LOW (ref 70–99)
Glucose-Capillary: 129 mg/dL — ABNORMAL HIGH (ref 70–99)
Glucose-Capillary: 50 mg/dL — ABNORMAL LOW (ref 70–99)
Glucose-Capillary: 132 mg/dL — ABNORMAL HIGH (ref 70–99)
Glucose-Capillary: 82 mg/dL (ref 70–99)

## 2021-09-27 LAB — COMPREHENSIVE METABOLIC PANEL
ALT: 12 U/L (ref 0–44)
AST: 24 U/L (ref 15–41)
Albumin: 2.1 g/dL — ABNORMAL LOW (ref 3.5–5.0)
Alkaline Phosphatase: 113 U/L (ref 38–126)
Anion gap: 8 (ref 5–15)
BUN: 37 mg/dL — ABNORMAL HIGH (ref 6–20)
CO2: 31 mmol/L (ref 22–32)
Calcium: 8.3 mg/dL — ABNORMAL LOW (ref 8.9–10.3)
Chloride: 103 mmol/L (ref 98–111)
Creatinine, Ser: 0.51 mg/dL (ref 0.44–1.00)
GFR, Estimated: >60 mL/min (ref >60)
Glucose, Bld: 70 mg/dL (ref 70–99)
Potassium: 3.1 mmol/L — ABNORMAL LOW (ref 3.5–5.1)
Sodium: 142 mmol/L (ref 135–145)
Total Bilirubin: 0.9 mg/dL (ref 0.3–1.2)
Total Protein: 5.7 g/dL — ABNORMAL LOW (ref 6.5–8.1)

## 2021-09-27 LAB — CBC
HCT: 27.6 % — ABNORMAL LOW (ref 36.0–46.0)
Hemoglobin: 9.1 g/dL — ABNORMAL LOW (ref 12.0–15.0)
MCH: 26.2 pg (ref 26.0–34.0)
MCHC: 33 g/dL (ref 30.0–36.0)
MCV: 79.5 fL — ABNORMAL LOW (ref 80.0–100.0)
Platelets: 222 10*3/uL (ref 150–400)
RBC: 3.47 MIL/uL — ABNORMAL LOW (ref 3.87–5.11)
RDW: 22.8 % — ABNORMAL HIGH (ref 11.5–15.5)
WBC: 9.6 10*3/uL (ref 4.0–10.5)
nRBC: 0 % (ref 0.0–0.2)

## 2021-09-27 LAB — PHOSPHORUS: Phosphorus: 6.5 mg/dL — ABNORMAL HIGH (ref 2.5–4.6)

## 2021-09-27 LAB — TRIGLYCERIDES: Triglycerides: 72 mg/dL (ref <150)

## 2021-09-27 LAB — MAGNESIUM: Magnesium: 2 mg/dL (ref 1.7–2.4)

## 2021-09-27 MED ORDER — DEXTROSE 50 % IV SOLN
12.5000 g | INTRAVENOUS | Status: AC
  Administered 2021-09-27: 12.5 g via INTRAVENOUS

## 2021-09-27 MED ORDER — OSMOLITE 1.2 CAL PO LIQD: 1000.0000 mL | ORAL | Status: DC

## 2021-09-27 MED ORDER — POTASSIUM CHLORIDE 10 MEQ/100ML IV SOLN
10.0000 meq | INTRAVENOUS | Status: AC
  Administered 2021-09-27 (×3): 10 meq via INTRAVENOUS
  Filled 2021-09-27 (×3): qty 100

## 2021-09-27 MED ORDER — ALBUMIN HUMAN 25 % IV SOLN
12.5000 g | Freq: Three times a day (TID) | INTRAVENOUS | Status: DC
  Administered 2021-09-27 – 2021-09-29 (×6): 12.5 g via INTRAVENOUS
  Filled 2021-09-27 (×6): qty 50

## 2021-09-27 MED ORDER — THIAMINE HCL 100 MG/ML IJ SOLN
100.0000 mg | Freq: Every day | INTRAMUSCULAR | Status: DC
  Administered 2021-09-27 – 2021-10-01 (×5): 100 mg via INTRAVENOUS
  Filled 2021-09-27 (×5): qty 2

## 2021-09-27 MED ORDER — FUROSEMIDE 10 MG/ML IJ SOLN
20.0000 mg | Freq: Two times a day (BID) | INTRAMUSCULAR | Status: AC
  Administered 2021-09-27 (×2): 20 mg via INTRAVENOUS
  Filled 2021-09-27 (×2): qty 2

## 2021-09-27 MED ORDER — DEXTROSE 50 % IV SOLN
25.0000 g | INTRAVENOUS | Status: AC
  Administered 2021-09-27: 25 g via INTRAVENOUS

## 2021-09-27 MED ORDER — METRONIDAZOLE 500 MG/100ML IV SOLN
500.0000 mg | Freq: Two times a day (BID) | INTRAVENOUS | Status: DC
  Administered 2021-09-27 – 2021-09-28 (×2): 500 mg via INTRAVENOUS
  Filled 2021-09-27 (×2): qty 100

## 2021-09-27 MED ORDER — PANTOPRAZOLE SODIUM 40 MG IV SOLR
40.0000 mg | Freq: Two times a day (BID) | INTRAVENOUS | Status: DC
  Administered 2021-09-27 – 2021-10-01 (×10): 40 mg via INTRAVENOUS
  Filled 2021-09-27 (×10): qty 10

## 2021-09-27 NOTE — Progress Notes (Signed)
?PROGRESS NOTE ? ? ? ?Terri [name]  CVE:[id number] DOB: [birth year] DOA: 08/31/2021 ?PCP: Pcp, No  ? ?Brief Narrative: ?46 year old with past medical history significant for homelessness, recurrent GIB, chronic microcytic anemia, hypertension, polysubstance abuse, Roux n Y presented to [facility] hospital complaining of dark color related stool.  Patient has history of chronic GI bleed secondary to peptic ulcers.  She underwent endoscopy with large anastomotic ulceration noted and is started on PPI.  Hemoglobin is stabilized. ? ?She was subsequently found to have MSSA bacteremia that appears to be disseminated.  Her left great toe was noted to be gangrenous, vascular surgery and orthopedics determined amputation will be best route for patient and this was done on 4/26.  Culture from great toe grew with MRSA and patient was broadened to vancomycin.  She was found to have multiple liver abscess, and history of portal vein thrombosis. ? ?During hospital course she started to require increased oxygen support, ultimately transferred to ICU and intubated. ? ?Hospital events: ? ?4/10 admitted ?4/13 egd for gib ?4/15 vascular surgery consulted for gangrenous L toe ?4/18 ID consulted for mssa infection, liver abscess, osteo, mrsa pneumonia ?4/23 Thoracentesis, culture negative, exudative ?4/26: ortho consulted for amputation of L great toe ?4/27 psych consulted for capacity, pt was placed on ivc ?4/28: transferred to ICU for increasing oxygen requirement ?4/29: ccm consulted  ?4/30 - Patient intubated  ?Palliative care consult - full code ?5/1 - [place] groin left - no abscess, Moderate Ascites + ?5/2 - Faild SBT.  Remains on propofol and fentanyl.  ?S/p 1 unit probc ?Pall care consult ordeed ?ID consult - noted ?Eso candidadis - finished diflucan ?MRSA pna, great toe osteo s/p amputation - change vanco to teflaro ?Polymicrobial hepatic abscess -> change unasyn to teflaro ?09/23/21:  fever curve better. On vent Fio2 30%, Diprivan  gtt, Fent gtt. Hgb again < 7gm% and 1 unit prbc given. On vent. Ex boyrfriend Terri [name] at bedside ?Pallative care - reestablished ?09/24/21 - 40% fio2, afebrile since 09/22/21. WBC normal. +16L volume overload.  Higher sedation needs ?5/5: Passed spontaneous breathing trial extubated.  Diuresis continued, trialing albumin and Lasix. ?5/07: Transfer to [facility] ? ? ? ?Assessment & Plan: ?  ?Principal Problem: ?  Disseminated MSSA with liver abscess, left foot osteo and abscess, 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 ?Active Problems: ?  Multilobar pneumonia with acute hypoxic respiratory failure ([place]) ?  Hepatic abscess ?  Acute respiratory failure with hypoxia ([place]) ?  Acute on chronic blood loss anemia ?  Swelling of left hand ?  Portal vein thrombosis ?  Exudative pleural effusion ?  Involuntary commitment ?  Ascites ?  Vaginal discharge ?  Polysubstance abuse ([place]) ?  Hypophosphatemia ?  Candida infection, esophageal ([place]) ?  Opioid abuse with opioid-induced mood disorder ([place]) ?  Leucocytosis ?  Protein-calorie malnutrition, severe ? ?1-Acute hypoxic hypercarbic respiratory failure ?Intubated/30/2023 due to MRSA pneumonia ?Bilateral pleural effusion, right more than left ?Status post right thoracentesis 4/23, exudative based on pleural fluid study.  Culture negative. ?Extubation 5/5 ?Chest x-ray 5/7: Stable infiltrate in the lateral left lung base, small layering right effusion with underlying opacities stable to mildly more prominent.  There is new right perihilar opacity ?Continue with Teflaro and IV flagyl.  ?Currently on 10 L high flow oxygen ? ?2-Volume overload, pulmonary edema acute ?Plan to do IV lasix Times 2 plus albumin ? 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 ?Left groin possible abscess versus mass; ?5 cm mass left groin and Doppler X lower extremity 09/20/2021. ?Not seen on  nonvascular ultrasound of left groin 5/1 ?Will need follow-up ? ?Acute encephalopathy due to sepsis, complicated further by chronic narcotic dependence. ?Has been seen by psych and deemed not to have capacity to make medical decisions ?Calm.  ? ?History of portal vein thrombosis: No candidate for anticoagulation.  ? ?Acute GI bleed admitted with a hemoglobin of 4.6 superimposed on chronic microcytic anemia ?Endoscopy: Large anastomotic Ulceration.  ?On IV PPI.  ? ?Thrombocytopenia:  ?Likely related to infection ?Resolved ? ?Hypokalemia: Replaced IV ? ?Hypoglycemia: On D5, monitor ? ?Homelessness ?Polysubstance abuse ?Psych was consulted and recommended IVC, may need to revisit capacity again prior to discharge if cooperation becomes an issue. ? ? ?Severe protein caloric malnutrition and failure to thrive ?She failed a swallow evaluation.  She will need core track for tube feedings ?Start IV thiamine ? ?Vitamin D deficiency ?On vitamin D supplements ? ? ?Pressure Injury 09/24/21 Sacrum Medial Deep Tissue Pressure Injury - Purple or maroon localized area of discolored intact skin or blood-filled blister due to damage of underlying soft tissue from pressure and/or shear. several deep tissue injuries, dark  (Active)  ?09/24/21 1100  ?Location: Sacrum  ?Location Orientation: Medial  ?Staging: Deep Tissue Pressure Injury - Purple or maroon localized area of discolored intact skin or blood-filled blister due to damage of underlying soft tissue from pressure and/or shear.  ?Wound Description (Comments): several deep tissue injuries, dark purple, non-blanchable  ?Present on Admission: No  ?Dressing Type Foam - Lift dressing to assess site every shift 09/27/21 0400  ?   ?Pressure Injury 09/24/21 Buttocks Left;Posterior;Proximal Deep Tissue Pressure Injury - Purple or maroon localized area of discolored intact skin or blood-filled blister due to damage of underlying soft tissue from pressure and/or shear. several purple,   (Active)  ?09/24/21 1100  ?Location: Buttocks  ?Location Orientation: Left;Posterior;Proximal  ?Staging: Deep Tissue Pressure Injury - Purple or maroon localized area of discolored intact skin or blood-filled blister due to damage of underlying soft tissue from pressure and/or shear.  ?Wound Description (Comments): several purple, non-blanchable  ?Present on Admission: No  ?Dressing Type Foam - Lift dressing to assess site every shift 09/27/21 0400  ?   ?Pressure Injury 09/24/21 Vertebral column Lower Deep Tissue Pressure Injury - Purple or maroon localized area of discolored intact skin or blood-filled blister due to damage of underlying soft tissue from pressure and/or shear. spinal column deep tissue, (Active)  ?09/24/21 1100  ?Location: Vertebral column  ?Location Orientation: Lower  ?Staging: Deep Tissue Pressure Injury - Purple or maroon localized area of discolored intact skin or blood-filled blister due to damage of underlying soft tissue from pressure and/or shear.  ?Wound Description (Comments): spinal column deep tissue, purple, non-blanchable  ?Present on Admission: No  ?Dressing Type Foam - Lift dressing to assess site every shift 09/27/21 0400  ?   ?Pressure Injury 09/24/21 Vertebral column Right;Upper Deep Tissue Pressure Injury - Purple or maroon localized area of discolored intact skin or blood-filled blister due to damage of underlying soft tissue from pressure and/or shear. purple, non-blanchab (Active)  ?09/24/21 1100  ?Location: Vertebral column  ?Location Orientation: Right;Upper  ?Staging: Deep Tissue Pressure Injury - Purple or maroon localized area of discolored intact skin or blood-filled blister due to damage of underlying soft tissue from pressure and/or shear.  ?Wound Description (Comments): purple, non-blanchable  ?Present on  Admission: No  ?Dressing Type Foam - Lift dressing to assess site every shift 09/26/21 2000  ? ? ? ?Nutrition Problem: Severe Malnutrition ?Etiology: acute illness  (GIB, thrush, liver abscess) ? ? ? ?Signs/Symptoms: energy intake < or equal to 50% for > or equal to 5 days, moderate fat depletion, severe muscle depletion ? ? ? ?Interventions: Refer to RD note for recomme

## 2021-09-27 NOTE — Progress Notes (Signed)
Hypoglycemic Event ? ?CBG: 50 ? ?Treatment: Dextrose 50% 25 g ? ?Symptoms: none ? ?Follow-up CBG: Time: 1701   CBG Result: 132 ? ?Possible Reasons for Event: inadequate meal intake  ? ?Comments/MD notified:Yes, Dr. Tyrell Antonio  ? ? ? ?Terri [name] ? ? ?

## 2021-09-27 NOTE — Progress Notes (Signed)
Brief Nutrition Note ? ?Consult received for enteral/tube feeding initiation and management. ? ?Adult Enteral Nutrition Protocol initiated. Full assessment to follow. ? ?Admitting Dx: Melena [K92.1] ?Acute GI bleeding [K92.2] ?Acute upper GI bleed [K92.2] ?Anemia, unspecified type [D64.9] ? ?Labs: ? ?Recent Labs  ?Lab 09/24/21 ?0353 09/25/21 ?0542 09/26/21 ?0424 09/26/21 ?1802 09/27/21 ?1962 09/27/21 ?0708  ?NA 139 139 141 141 142  --   ?K 3.9 3.5 2.7* 3.2* 3.1*  --   ?CL 105 102 97* 98 103  --   ?CO2 31 32 33* 33* 31  --   ?BUN 28* 27* 29* 33* 37*  --   ?CREATININE 0.37* <0.30* 0.38* 0.46 0.51  --   ?CALCIUM 7.6* 7.0* 8.1* 8.4* 8.3*  --   ?MG 1.9 2.0  --   --   --  2.0  ?PHOS 3.1 3.6  --   --   --  6.5*  ?GLUCOSE 117* 106* 80 78 70  --   ? ? ?Corrin Parker, MS, RD, LDN ?RD pager number/after hours weekend pager number on Amion. ?  ?

## 2021-09-27 NOTE — Progress Notes (Signed)
Hypoglycemic Event ? ?CBG: 68 ? ?Treatment: Dextrose 50% 12.5 g ? ?Symptoms: none ? ?Follow-up CBG: Time:1209 CBG Result:129 ? ?Possible Reasons for Event: Poor PO intake ? ?Comments/MD notified: Yes, Dr. Tyrell Antonio ? ? ? ?Terri [name] ? ? ?

## 2021-09-27 NOTE — Progress Notes (Signed)
Attempted x2 to place dobhoff tube. The first tube was showing to be in the upper quadrant of the stomach but would not flush. The second one would not advance past 55 centimeters. MD made aware and recommended IR to advance tube. This RN left dobhoff tube in place and taped it at 55 centimeters. Will wait to use once IR can advance it. Stylet left in place.  ?

## 2021-09-28 ENCOUNTER — Inpatient Hospital Stay (HOSPITAL_COMMUNITY): Payer: Medicaid Other

## 2021-09-28 DIAGNOSIS — J9601 Acute respiratory failure with hypoxia: Secondary | ICD-10-CM | POA: Diagnosis not present

## 2021-09-28 DIAGNOSIS — K750 Abscess of liver: Secondary | ICD-10-CM | POA: Diagnosis not present

## 2021-09-28 DIAGNOSIS — J15212 Pneumonia due to Methicillin resistant Staphylococcus aureus: Secondary | ICD-10-CM

## 2021-09-28 DIAGNOSIS — A4901 Methicillin susceptible Staphylococcus aureus infection, unspecified site: Secondary | ICD-10-CM | POA: Diagnosis not present

## 2021-09-28 DIAGNOSIS — B3781 Candidal esophagitis: Secondary | ICD-10-CM | POA: Diagnosis not present

## 2021-09-28 LAB — BASIC METABOLIC PANEL
Anion gap: 10 (ref 5–15)
BUN: 47 mg/dL — ABNORMAL HIGH (ref 6–20)
CO2: 28 mmol/L (ref 22–32)
Calcium: 8.1 mg/dL — ABNORMAL LOW (ref 8.9–10.3)
Chloride: 104 mmol/L (ref 98–111)
Creatinine, Ser: 0.57 mg/dL (ref 0.44–1.00)
GFR, Estimated: >60 mL/min (ref >60)
Glucose, Bld: 74 mg/dL (ref 70–99)
Potassium: 2.6 mmol/L — CL (ref 3.5–5.1)
Sodium: 142 mmol/L (ref 135–145)

## 2021-09-28 LAB — GLUCOSE, CAPILLARY
Glucose-Capillary: 85 mg/dL (ref 70–99)
Glucose-Capillary: 85 mg/dL (ref 70–99)
Glucose-Capillary: 80 mg/dL (ref 70–99)
Glucose-Capillary: 114 mg/dL — ABNORMAL HIGH (ref 70–99)
Glucose-Capillary: 68 mg/dL — ABNORMAL LOW (ref 70–99)
Glucose-Capillary: 73 mg/dL (ref 70–99)
Glucose-Capillary: 87 mg/dL (ref 70–99)
Glucose-Capillary: 77 mg/dL (ref 70–99)

## 2021-09-28 LAB — CBC
HCT: 27.5 % — ABNORMAL LOW (ref 36.0–46.0)
Hemoglobin: 9.4 g/dL — ABNORMAL LOW (ref 12.0–15.0)
MCH: 26.8 pg (ref 26.0–34.0)
MCHC: 34.2 g/dL (ref 30.0–36.0)
MCV: 78.3 fL — ABNORMAL LOW (ref 80.0–100.0)
Platelets: 265 10*3/uL (ref 150–400)
RBC: 3.51 MIL/uL — ABNORMAL LOW (ref 3.87–5.11)
RDW: 22.5 % — ABNORMAL HIGH (ref 11.5–15.5)
WBC: 7.6 10*3/uL (ref 4.0–10.5)
nRBC: 0 % (ref 0.0–0.2)

## 2021-09-28 LAB — MAGNESIUM: Magnesium: 2.1 mg/dL (ref 1.7–2.4)

## 2021-09-28 MED ORDER — SODIUM CHLORIDE 0.9 % IV SOLN
3.0000 g | Freq: Four times a day (QID) | INTRAVENOUS | Status: DC
  Administered 2021-09-28 – 2021-10-12 (×55): 3 g via INTRAVENOUS
  Filled 2021-09-28 (×63): qty 8

## 2021-09-28 MED ORDER — POTASSIUM CHLORIDE 10 MEQ/100ML IV SOLN
10.0000 meq | INTRAVENOUS | Status: AC
  Administered 2021-09-28 (×5): 10 meq via INTRAVENOUS
  Filled 2021-09-28 (×5): qty 100

## 2021-09-28 MED ORDER — DEXTROSE 10 % IV SOLN: INTRAVENOUS | Status: AC

## 2021-09-28 MED ORDER — ACETAMINOPHEN 10 MG/ML IV SOLN
1000.0000 mg | Freq: Four times a day (QID) | INTRAVENOUS | Status: AC | PRN
  Administered 2021-09-28 – 2021-09-29 (×2): 1000 mg via INTRAVENOUS
  Filled 2021-09-28 (×2): qty 100

## 2021-09-28 MED ORDER — POTASSIUM CHLORIDE 2 MEQ/ML IV SOLN: INTRAVENOUS | Status: DC

## 2021-09-28 NOTE — Progress Notes (Signed)
Subjective: ?12 Days Post-Op Procedure(s) (LRB): ?AMPUTATION OF LEFT GREAT TOE (Left) ? ?Patient reports no pain in her left foot.  She is currently in the ICU.  ? ?Objective:  ? ?VITALS:  Temp:  [97.3 ?F (36.3 ?C)-97.5 ?F (36.4 ?C)] 97.3 ?F (36.3 ?C) (05/08 0800) ?Pulse Rate:  [56-71] 62 (05/08 1550) ?Resp:  [20-31] 27 (05/08 1550) ?BP: (137-196)/(78-101) 139/87 (05/08 1500) ?SpO2:  [89 %-99 %] 92 % (05/08 1550) ?Weight:  [61.8 kg] 61.8 kg (05/08 0600) ? ?Gen: AAOx3, NAD ?Comfortable at rest ? ?Left Lower Extremity: ?Skin intact, incision c/d/I over great toe stump, sutures in place ?No TTP over incision, no active drainage ?HF/KE/KF/ADF/APF 5/5 ?SILT throughout ?DP, PT 2+ to palp ?CR < 2s ? ? ?LABS ?Recent Labs  ?  09/26/21 ?0424 09/27/21 ?0981 09/28/21 ?1914  ?HGB 9.4* 9.1* 9.4*  ?WBC 8.3 9.6 7.6  ?PLT 142* 222 265  ? ?Recent Labs  ?  09/27/21 ?7829 09/28/21 ?5621  ?NA 142 142  ?K 3.1* 2.6*  ?CL 103 104  ?CO2 31 28  ?BUN 37* 47*  ?CREATININE 0.51 0.57  ?GLUCOSE 70 74  ? ?No results for input(s): LABPT, INR in the last 72 hours. ? ? ?Assessment/Plan: ?12 Days Post-Op Procedure(s) (LRB): ?AMPUTATION OF LEFT GREAT TOE (Left) ? ?-IV abx per primary team; trend intraoperative cultures ?-maximum elevation of operative extremity to minimize swelling ?-OK to ambulate with heel weightbearing in CAM boot or postop shoe ?-pain meds per primary team ?-VTE prophylaxis per primary team discretion ?-discharge to [facility] and then home per primary team ?-plan for sutures out 3 weeks after surgery (Ortho to re-evaluate wound approximately around 10/07/21) ?-follow up with Ortho after discharge from hospital for wound check ([phone] for appt) ? ?Armond Hang ?09/28/2021, 3:54 PM ? ?

## 2021-09-28 NOTE — Progress Notes (Signed)
Hypoglycemic Event ? ?CBG: 68 ? ?Treatment: D50 25 mL (12.5 gm) ? ?Symptoms: None ? ?Follow-up CBG: Time:1202 CBG Result:114 ? ?Possible Reasons for Event: Inadequate meal intake ? ?Comments/MD notified:Dr. Regalado ? ? ? ?Terri [name] ? ? ?

## 2021-09-28 NOTE — Progress Notes (Addendum)
?PROGRESS NOTE ? ? ? ?Terri [name]  AYT:[id number] DOB: 1976/05/05 DOA: 08/31/2021 ?PCP: Pcp, No  ? ?Brief Narrative: ?46 year old with past medical history significant for homelessness, recurrent GIB, chronic microcytic anemia, hypertension, polysubstance abuse, Roux n Y presented to [facility] hospital complaining of dark color related stool.  Patient has history of chronic GI bleed secondary to peptic ulcers.  She underwent endoscopy with large anastomotic ulceration noted and is started on PPI.  Hemoglobin is stabilized. ? ?She was subsequently found to have MSSA bacteremia that appears to be disseminated.  Her left great toe was noted to be gangrenous, vascular surgery and orthopedics determined amputation will be best route for patient and this was done on 4/26.  Culture from great toe grew with MRSA and patient was broadened to vancomycin.  She was found to have multiple liver abscess, and history of portal vein thrombosis. ? ?During hospital course she started to require increased oxygen support, ultimately transferred to ICU and intubated. ? ?Hospital events: ? ?4/10 admitted ?4/13 egd for gib ?4/15 vascular surgery consulted for gangrenous L toe ?4/18 ID consulted for mssa infection, liver abscess, osteo, mrsa pneumonia ?4/23 Thoracentesis, culture negative, exudative ?4/26: ortho consulted for amputation of L great toe ?4/27 psych consulted for capacity, pt was placed on ivc ?4/28: transferred to ICU for increasing oxygen requirement ?4/29: ccm consulted  ?4/30 - Patient intubated  ?Palliative care consult - full code ?5/1 - [place] groin left - no abscess, Moderate Ascites + ?5/2 - Faild SBT.  Remains on propofol and fentanyl.  ?S/p 1 unit probc ?Pall care consult ordeed ?ID consult - noted ?Eso candidadis - finished diflucan ?MRSA pna, great toe osteo s/p amputation - change vanco to teflaro ?Polymicrobial hepatic abscess -> change unasyn to teflaro ?09/23/21:  fever curve better. On vent Fio2 30%, Diprivan  gtt, Fent gtt. Hgb again < 7gm% and 1 unit prbc given. On vent. Ex boyrfriend Fritz Pickerel at bedside ?Pallative care - reestablished ?09/24/21 - 40% fio2, afebrile since 09/22/21. WBC normal. +16L volume overload.  Higher sedation needs ?5/5: Passed spontaneous breathing trial extubated.  Diuresis continued, trialing albumin and Lasix. ?5/07: Transfer to [facility] ? ? ? ?Assessment & Plan: ?  ?Principal Problem: ?  Disseminated MSSA with liver abscess, left foot osteo and abscess, 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 ?Active Problems: ?  Multilobar pneumonia with acute hypoxic respiratory failure ([place]) ?  Hepatic abscess ?  Acute respiratory failure with hypoxia ([place]) ?  Acute on chronic blood loss anemia ?  Swelling of left hand ?  Portal vein thrombosis ?  Exudative pleural effusion ?  Involuntary commitment ?  Ascites ?  Vaginal discharge ?  Polysubstance abuse ([place]) ?  Hypophosphatemia ?  Candida infection, esophageal ([place]) ?  Opioid abuse with opioid-induced mood disorder ([employer]) ?  Leucocytosis ?  Protein-calorie malnutrition, severe ? ?1-Acute hypoxic hypercarbic respiratory failure ?Intubated/30/2023 due to MRSA pneumonia ?Bilateral pleural effusion, right more than left ?Status post right thoracentesis 4/23, exudative based on pleural fluid study.  Culture negative. ?Extubation 5/5 ?Chest x-ray 5/7: Stable infiltrate in the lateral left lung base, small layering right effusion with underlying opacities stable to mildly more prominent.  There is new right perihilar opacity ?Treated with  Teflaro and IV flagyl. Had almost 10 days coverage for MRSA PNA>  ?Currently on 3 L high flow oxygen ?Antibiotics change to Unasyn. To target MSSA hepatic abscess, will also cover Bacteroids.  ?Per ID she will need TEE when stable for resp status.  ? ?  2-Volume overload, pulmonary edema acute ?Received IV lasix and albumin 5/07 ?Oxygen requirement down to 3 L.  ?Monitor. Hold on IV lasix today.  ? ?Disseminated MSSA infection, osteo of left great toe  status post amputation ?MRSA pneumonia ?Polymicrobial liver abscess ?Treated  with Teflaro and IV Flagyl initially.  ?Completed 10 days coverage MRSA PNA>  ?Follow ID recommendation ?Now on Unasyn to cover Liver Abscess.  ?She will need TEE when more stable.  ? ?Severe protein caloric malnutrition and failure to thrive ?She failed a swallow evaluation.  She will need core track for tube feedings ?Started  IV thiamine ?Core track unable to be advance by IR> will ask GI to see.  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  He will not be possible to place a tube in her stomach due to limited space.  She also has a large anastomotic ulcer which complicate things.  We can consider surgery consult for a J-tube placement due to her anatomy. ?-I will start TPN.  She is probably very weak to undergoing surgical procedure, will ask surgery to evaluate tomorrow.  ? ?Left groin possible abscess versus mass; ?5 cm mass left groin and Doppler X lower extremity 09/20/2021. ?Not seen on nonvascular ultrasound of left groin 5/1 ?Will need follow-up ? ?Acute encephalopathy due to sepsis, complicated further by chronic narcotic dependence. ?Has been seen by psych and deemed not to have capacity to make medical decisions ?She is calm.  ? ?History of portal vein thrombosis: No candidate for anticoagulation.  ? ?Acute GI bleed admitted with a hemoglobin of 4.6 superimposed on chronic microcytic anemia ?Endoscopy: Large anastomotic Ulceration.  ?On IV PPI.  ? ?Thrombocytopenia:  ?Likely related to infection ?Resolved ? ?Hypokalemia: Replaced IV with 5 runs ? ?Hypoglycemia: Change fluids to D 10  ? ?Homelessness ?Polysubstance abuse ?Psych was consulted and recommended IVC, may need to revisit capacity again prior to discharge if cooperation becomes an issue. ? ? ? ? ?Vitamin D deficiency ?On vitamin D supplements ? ? ?Pressure Injury 09/24/21 Sacrum Medial Deep Tissue Pressure Injury - Purple or  maroon localized area of discolored intact skin or blood-filled blister due to damage of underlying soft tissue from pressure and/or shear. several deep tissue injuries, dark  (Active)  ?09/24/21 1100  ?Location: Sacrum  ?Location Orientation: Medial  ?Staging: Deep Tissue Pressure Injury - Purple or maroon localized area of discolored intact skin or blood-filled blister due to damage of underlying soft tissue from pressure and/or shear.  ?Wound Description (Comments): several deep tissue injuries, dark purple, non-blanchable  ?Present on Admission: No  ?Dressing Type Foam - Lift dressing to assess site every shift 09/27/21 1456  ?   ?Pressure Injury 09/24/21 Buttocks Left;Posterior;Proximal Deep Tissue Pressure Injury - Purple or maroon localized area of discolored intact skin or blood-filled blister due to damage of underlying soft tissue from pressure and/or shear. several purple,  (Active)  ?09/24/21 1100  ?Location: Buttocks  ?Location Orientation: Left;Posterior;Proximal  ?Staging: Deep Tissue Pressure Injury - Purple or maroon localized area of discolored intact skin or blood-filled blister due to damage of underlying soft tissue from pressure and/or shear.  ?Wound Description (Comments): several purple, non-blanchable  ?Present on Admission: No  ?Dressing Type Foam - Lift dressing to assess site every shift 09/27/21 1456  ?   ?Pressure Injury 09/24/21 Vertebral column Lower Deep Tissue Pressure Injury - Purple or maroon localized area of discolored intact skin or blood-filled blister due  to damage of underlying soft tissue from pressure and/or shear. spinal column deep tissue, (Active)  ?09/24/21 1100  ?Location: Vertebral column  ?Location Orientation: Lower  ?Staging: Deep Tissue Pressure Injury - Purple or maroon localized area of discolored intact skin or blood-filled blister due to damage of underlying soft tissue from pressure and/or shear.  ?Wound Description (Comments): spinal column deep tissue,  purple, non-blanchable  ?Present on Admission: No  ?Dressing Type Foam - Lift dressing to assess site every shift 09/27/21 1456  ?   ?Pressure Injury 09/24/21 Vertebral column Right;Upper Deep Tissue Pressu

## 2021-09-28 NOTE — Progress Notes (Addendum)
Pt was brought to fluoro room for reposition of existing Dobbhoff that previous study reports was at gastroesophageal junction. 5 ml saline was injected into tube and existing flexible guidewire removed. Pt was given local anesthetic spray to throat but refused nasal anesthetic. Amplatz wire was inserted and advanced into existing nasogastric tube. Three attempts were made under fluoroscopy to advance tube into position. Dr. Nelia Shi made several attempts as well. Tube was unable to be advanced into correct position. Procedure was stopped at patient request. Primary RN at bedside for procedure and will notify care teams of outcome.  ? ? ?Narda Rutherford, AGNP-BC ?09/28/2021, 1:21 PM ? ? ?

## 2021-09-28 NOTE — Progress Notes (Addendum)
Nutrition Follow-up ? ?DOCUMENTATION CODES:  ? ?Severe malnutrition in context of acute illness/injury ? ?INTERVENTION:  ?- will d/c Osmolite and Juven orders d/t no safe enteral access. ? ?- TPN per Pharmacist. ? ?- monitor magnesium, potassium, and phosphorus BID for at least 3 days, MD to replete as needed, as pt is at risk for refeeding syndrome given severe malnutrition. ? ? ?NUTRITION DIAGNOSIS:  ? ?Severe Malnutrition related to acute illness (GIB, thrush, liver abscess) as evidenced by energy intake < or equal to 50% for > or equal to 5 days, moderate fat depletion, severe muscle depletion. -ongoing ? ?GOAL:  ? ?Patient will meet greater than or equal to 90% of their needs -unable to meet at this time ? ?MONITOR:  ? ?TF tolerance, Diet advancement, Labs, Weight trends, Skin ? ?REASON FOR ASSESSMENT:  ? ?Consult ?Enteral/tube feeding initiation and management ? ?ASSESSMENT:  ? ?46 year old woman admitted for acute GIB. EGD showed esophageal plaques consistent with candidiasis, large cratered nonbleeding duodenal ulcer with no stigmata of bleeding.  CT angiogram on 08/31/2021 showed multiple low-attenuation lesions throughout the liver seen on recent prior exam, highly concerning for hepatic abscesses or metastasis.  Now s/p IR US guided bx and aspiration, revealing MSSA abscess. Of note, patient with hx of roux-en-Y gastric bypass (unknown date). ? ?Patient remains NPO since 4/29. Small bore NGT placed at bedside yesterday and patient leaving for IR at the time of RD visit earlier this afternoon for NGT to be advanced into a more satisfactory gastric placement.  ? ?Orders in place for Osmolite 1.2 @ 50 with 1 packet Juven BID in place but unable to provide until tube advanced.  ? ?IR NP note from a short time ago indicates multiple attempts at small bore NGT advancement unsuccessful.  ? ?Patient to have MBS when feasible.  ? ?Weight has been fluctuating nearly daily since admission. Moderate pitting edema to  BUE and LLE and deep pitting edema to RLE documented in the edema section of flow sheet.  ? 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 ? ?Labs reviewed; CBGs: 85, 85, 80, 68, 114 mg/dl, K: 2.6 mmol/l, BUN: 47 mg/dl, Ca: 8.1 mg/dl. ? ?Medications reviewed; 12.5 g albumin TID starting 5/7 AM, 1000 units cholecalciferol/day, 1 mg folvite/day, 400 mg mag-ox BID, 15 ml multivitamin/day, 40 mg IV protonix BID, 40 mEq Klor-Con/day, 1 g carafate TID, 100 mg IV thiamine/day, 1000 mcg cyanocobalamin per tube/day.  ? ?IVF; D10-1/2 NS-10 mEq IV KCl @ 75 ml/hr (306 kcal/24 hrs).  ? ? ?Diet Order:   ?Diet Order   ? ?       ?  Diet NPO time specified Except for: Sips with Meds  Diet effective midnight       ?  ? ?  ?  ? ?  ? ? ?EDUCATION NEEDS:  ? ?Not appropriate for education at this time ? ?Skin:  Skin Assessment: Skin Integrity Issues: ?Skin Integrity Issues:: DTI ?DTI: sacrum; L buttocks; vertebral column x2 ?Other: left forefoot abscess, left great toe osteomyelitis ? ?Last BM:  5/8 (400 ml via rectal tube) ? ?Height:  ? ?Ht Readings from Last 1 Encounters:  ?09/18/21 '5\' 9"'$  (1.753 m)  ? ? ?Weight:  ? ?Wt Readings from Last 1 Encounters:  ?09/28/21 61.8  kg  ? ? ? ?BMI:  Body mass index is 20.12 kg/m?. ? ?Estimated Nutritional Needs:  ?Kcal:  2150-2350 kcal ?Protein:  100-115 grams ?Fluid:  >/= 2.2 L/day ? ? ? ? ?Jarome Matin, MS, RD, LDN ?Registered Dietitian II ?Inpatient Clinical Nutrition ?RD pager # and on-call/weekend pager # available in [place]  ? ?

## 2021-09-28 NOTE — Progress Notes (Signed)
Physical Therapy Treatment ?Patient Details ?Name: Terri [name] ?MRN: [id number] ?DOB: [birth year] ?Today's Date: 09/28/2021 ? ? ?History of Present Illness 46 yo female with pmh homelessness, recurrent gib, chronic microcytic anemia, htn, polysubstance abuse, roux en y presented to [facility] 08/31/21 with reports of dark colored stools. 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 ? ?  ?PT Comments  ? ? Pt agreeable to bed level exercises. Attempted to get pt to work on rolling but she was resistant to try on her own. Partially rolled onto back and then back onto R side with pillow positioned behind back and between knees. Performed PROM, AAROM, and some AROM as tolerated. Will continue to follow.  ?   ?Recommendations for follow up therapy are one component of a multi-disciplinary discharge planning process, led by the attending physician.  Recommendations may be updated based on patient status, additional functional criteria and insurance authorization. ? ?Follow Up Recommendations ? Skilled nursing-short term rehab (<3 hours/day) ?  ?  ?Assistance Recommended at Discharge Frequent or constant Supervision/Assistance  ?Patient can return home with the following Two people to help with walking and/or transfers;Assistance with cooking/housework;Direct supervision/assist for medications management;Assist for transportation;Two people to help with bathing/dressing/bathroom;Assistance with feeding;Help with stairs or ramp for entrance ?  ?Equipment Recommendations ? None recommended by PT  ?  ?Recommendations for Other Services   ? ? ?  ?Precautions / Restrictions Precautions ?Precautions: Fall ?Precaution Comments: flexiseal, cachectic  ?  ? ?Mobility ? Bed Mobility ?Overal bed mobility: Needs Assistance ?Bed Mobility: Rolling ?  ?  ?  ?  ?  ?General bed mobility comments: Attempted to work on rolling from R side onto back. Pt stated that she couldn't. Partially turned pt  slightly onto back and then back onto R side with pillow behind back and between knees. ?  ? ?Transfers ?  ?  ?  ?  ?  ?  ?  ?  ?  ?General transfer comment: will need a mechanical lift ?  ? ?Ambulation/Gait ?  ?  ?  ?  ?  ?  ?  ?  ? ? ?Stairs ?  ?  ?  ?  ?  ? ? ?Wheelchair Mobility ?  ? ?Modified Rankin (Stroke Patients Only) ?  ? ? ?  ?Balance   ?  ?  ?  ?  ?  ?  ?  ?  ?  ?  ?  ?  ?  ?  ?  ?  ?  ?  ?  ? ?  ?Cognition Arousal/Alertness: Awake/alert ?Behavior During Therapy: Flat affect ?Overall Cognitive Status: No family/caregiver present to determine baseline cognitive functioning ?  ?  ?  ?  ?  ?  ?  ?  ?  ?  ?  ?  ?  ?  ?  ?  ?  ?  ?  ? ?  ?Exercises General Exercises - Upper Extremity ?Shoulder Flexion: AAROM, Left, 10 reps ?Elbow Extension: AAROM, 10 reps, Both ?Wrist Flexion: AAROM, Both, 10 reps ?Wrist Extension: AAROM, Both, 10 reps ?General Exercises - Lower Extremity ?Ankle Circles/Pumps: AAROM, Both, 10 reps ?Heel Slides: AAROM, Both, 10 reps ?Hip ABduction/ADduction: AAROM, Left, 10 reps, Sidelying ? ?  ?General Comments   ?  ?  ? ?Pertinent Vitals/Pain Pain Assessment ?Pain Assessment: Faces ?Pain Location: back ?Pain Descriptors / Indicators: Discomfort, Sore ?Pain Intervention(s): Limited activity within patient's tolerance, Monitored during session, Repositioned  ? ? ?  Home Living   ?  ?  ?  ?  ?  ?  ?  ?  ?  ?   ?  ?Prior Function    ?  ?  ?   ? ?PT Goals (current goals can now be found in the care plan section) Progress towards PT goals: Progressing toward goals ? ?  ?Frequency ? ? ? Min 2X/week ? ? ? ?  ?PT Plan Current plan remains appropriate  ? ? ?Co-evaluation   ?  ?  ?  ?  ? ?  ?AM-PAC PT "6 Clicks" Mobility   ?Outcome Measure ? Help needed turning from your back to your side while in a flat bed without using bedrails?: Total ?Help needed moving from lying on your back to sitting on the side of a flat bed without using bedrails?: Total ?Help needed moving to and from a bed to a chair  (including a wheelchair)?: Total ?Help needed standing up from a chair using your arms (e.g., wheelchair or bedside chair)?: Total ?Help needed to walk in hospital room?: Total ?Help needed climbing 3-5 steps with a railing? : Total ?6 Click Score: 6 ? ?  ?End of Session Equipment Utilized During Treatment: Oxygen ?Activity Tolerance: Patient tolerated treatment well;Patient limited by fatigue ?Patient left: in bed;with call bell/phone within reach ?  ?PT Visit Diagnosis: Muscle weakness (generalized) (M62.81);Difficulty in walking, not elsewhere classified (R26.2);Pain;Adult, failure to thrive (R62.7) ?  ? ? ?Time: 6010-9323 ?PT Time Calculation (min) (ACUTE ONLY): 13 min ? ?Charges:  $Therapeutic Exercise: 8-22 mins          ? ? ? ? ?Doreatha Massed, PT ?Acute Rehabilitation  ?Office: [phone] ?Pager: [phone] ? ?  ? ?

## 2021-09-28 NOTE — Progress Notes (Signed)
SLP Cancellation Note ? ?Patient Details ?Name: Terri [name] ?MRN: [id number] ?DOB: 08/24/1975 ? ? ?Cancelled treatment:       Reason Eval/Treat Not Completed: Other (comment) (Patient to be getting NG advanced in IR. Spoke with RN and in agreement to postpone MBS; SLP will f/u next date) ? ? ?Sonia Baller, MA, CCC-SLP ?Speech Therapy ? ?

## 2021-09-28 NOTE — Progress Notes (Signed)
?   ? ?[facility] for Infectious Disease ? ?Date of Admission:  08/31/2021    ?       ?Reason for visit: Follow up on liver abscess ? ?ASSESSMENT:   ? ?46 y.o. female admitted with: ? ?Staph aureus pneumonia: Currently on ceftaroline after difficulties with supratherapeutic vancomycin dosing.  Today is day #11 of MRSA coverage for pneumonia. ?Hepatic abscess: Status post aspiration 4/17 with cultures growing MSSA and Bacteroides.  Currently covered by ceftaroline and metronidazole. 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 ?Hypoxic respiratory failure: Extubated last week and currently stable on nasal cannula. CXR 5/7 = small right effusion. ?Candida esophagitis: Finished treatment course. ? ?RECOMMENDATIONS:   ? ?Will narrow antibiotics to Unasyn to target MSSA hepatic abscess with cultures that also grew Bacteroides now that she has completed at least 10 days of possible MRSA pneumonia coverage ?Stop ceftaroline and metronidazole ?Needs TEE once stabilized from a pulmonary standpoint due to concern that her presentation was related to bacteremia and endocarditis ?Will follow ? ? ?Principal Problem: ?  Disseminated MSSA with liver abscess, left foot osteo and abscess, 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 ?Active Problems: ?  Opioid abuse with opioid-induced mood disorder ([facility]) ?  Leucocytosis ?  Polysubstance abuse ([place]) ?  Acute on chronic blood loss anemia ?  Multilobar pneumonia with acute hypoxic respiratory failure ([place]) ?  Portal vein thrombosis ?  Candida infection, esophageal ([place]) ?  Hepatic abscess ?  Swelling of left hand ?  Ascites ?  Exudative pleural effusion ?  Hypophosphatemia ?  Vaginal discharge ?  Acute respiratory failure with hypoxia ([place]) ?  Involuntary commitment ?  Protein-calorie malnutrition, severe ? ? ? ?MEDICATIONS:   ? ?Scheduled Meds: ? chlorhexidine  15 mL Mouth Rinse BID  ? Chlorhexidine Gluconate Cloth  6 each Topical Daily  ? cholecalciferol  1,000 Units Oral  Daily  ? cloNIDine  0.1 mg Transdermal Weekly  ? folic acid  1 mg Per Tube Daily  ? guaiFENesin  5 mL Per Tube Q6H  ? levalbuterol  0.63 mg Nebulization Q6H  ? lidocaine  1 patch Transdermal Daily  ? magnesium oxide  400 mg Per Tube BID  ? mouth rinse  15 mL Mouth Rinse q12n4p  ? multivitamin  15 mL Per Tube Daily  ? nicotine  21 mg Transdermal Daily  ? nutrition supplement (JUVEN)  1 packet Per Tube BID WC  ? pantoprazole (PROTONIX) IV  40 mg Intravenous Q12H  ? potassium chloride  40 mEq Per Tube Daily  ? sodium chloride flush  10-40 mL Intracatheter Q12H  ? sucralfate  1 g Per Tube TID WC & HS  ? thiamine injection  100 mg Intravenous Daily  ? vitamin B-12  1,000 mcg Per Tube Daily  ? ?Continuous Infusions: ? sodium chloride Stopped (09/28/21 1045)  ? albumin human Stopped (09/28/21 1029)  ? ampicillin-sulbactam (UNASYN) IV    ? dextrose 5 % and 0.45% NaCl 75 mL/hr at 09/28/21 1050  ? feeding supplement (OSMOLITE 1.2 CAL) Stopped (09/28/21 0734)  ? potassium chloride 100 mL/hr at 09/28/21 1050  ? ?PRN Meds:.acetaminophen (TYLENOL) oral liquid 160 mg/5 mL **OR** acetaminophen, dextrose, guaiFENesin-dextromethorphan, haloperidol lactate **AND** [DISCONTINUED] LORazepam, hydrALAZINE, lip balm, loperamide HCl, naLOXone (NARCAN)  injection, ondansetron (ZOFRAN) IV, saline, sodium chloride flush ? ?SUBJECTIVE:  ? ?24 hour events:  ?No acute events noted overnight ?Afebrile, Tmax 97.5 ?Remains on nasal cannula for supplemental oxygen ?Potassium 2.6 ?WBC 7.6 ?Hemoglobin stable ?No new cultures ? ?  Patient reports no new complaints this morning.  No fevers or chills.  She is tolerating antibiotics.  Her partner is at the bedside. ? ?Review of Systems  ?All other systems reviewed and are negative. ? ?  ?OBJECTIVE:  ? ?Blood pressure (!) 157/92, pulse 67, temperature (!) 97.3 ?F (36.3 ?C), temperature source Oral, resp. rate (!) 30, height '5\' 9"'$  (1.753 m), weight 61.8 kg, SpO2 91 %. ?Body mass index is 20.12  kg/m?. ? ?Physical Exam ?Constitutional:   ?   Comments: Thin, ill-appearing woman lying in bed, no acute distress.  ?HENT:  ?   Head: Normocephalic and atraumatic.  ?   Nose:  ?   Comments: NG tube in place. ?Eyes:  ?   Extraocular Movements: Extraocular movements intact.  ?   Conjunctiva/sclera: Conjunctivae normal.  ?Cardiovascular:  ?   Rate and Rhythm: Normal rate and regular rhythm.  ?Pulmonary:  ?   Effort: Pulmonary effort is normal. No respiratory distress.  ?   Breath sounds: Normal breath sounds.  ?Abdominal:  ?   General: There is no distension.  ?   Palpations: Abdomen is soft.  ?   Tenderness: There is no abdominal tenderness.  ?Musculoskeletal:  ?   Comments: Status post left big toe amputation  ?Skin: ?   General: Skin is warm and dry.  ?Neurological:  ?   General: No focal deficit present.  ?   Mental Status: She is oriented to person, place, and time.  ?Psychiatric:     ?   Mood and Affect: Mood normal.     ?   Behavior: Behavior normal.  ? ? ? ?Lab Results: ?Lab Results  ?Component Value Date  ? WBC 7.6 09/28/2021  ? HGB 9.4 (L) 09/28/2021  ? HCT 27.5 (L) 09/28/2021  ? MCV 78.3 (L) 09/28/2021  ? PLT 265 09/28/2021  ?  ?Lab Results  ?Component Value Date  ? NA 142 09/28/2021  ? K 2.6 (LL) 09/28/2021  ? CO2 28 09/28/2021  ? GLUCOSE 74 09/28/2021  ? BUN 47 (H) 09/28/2021  ? CREATININE 0.57 09/28/2021  ? CALCIUM 8.1 (L) 09/28/2021  ? GFRNONAA >60 09/28/2021  ? GFRAA >60 07/15/2019  ?  ?Lab Results  ?Component Value Date  ? ALT 12 09/27/2021  ? AST 24 09/27/2021  ? ALKPHOS 113 09/27/2021  ? BILITOT 0.9 09/27/2021  ? ? ?   ?Component Value Date/Time  ? CRP 1.2 (H) 05/29/2021 0346  ? ? ?   ?Component Value Date/Time  ? ESRSEDRATE 20 02/26/2014 0958  ? ?  ?I have reviewed the micro and lab results in Epic. ? ?Imaging: ?DG Abd 1 View ? ?Result Date: 09/27/2021 ?CLINICAL DATA:  Feeding tube placement EXAM: ABDOMEN - 1 VIEW COMPARISON:  Earlier same day FINDINGS: Soft feeding tube tip is very similar, located  at the gastroesophageal junction in the region of previous anastomotic sutures. IMPRESSION: No change. Tube tip at the gastroesophageal junction, in the region of previous anastomotic sutures. Electronically Signed   By: Nelson Chimes M.D.   On: 09/27/2021 11:44  ? ?DG CHEST PORT 1 VIEW ? ?Result Date: 09/27/2021 ?CLINICAL DATA:  Acute respiratory failure EXAM: PORTABLE CHEST 1 VIEW COMPARISON:  Sep 26, 2021 FINDINGS: A right PICC line remains in good position. No pneumothorax. A nipple shadow projects over the left base. Mild infiltrate in the lateral left base is stable. Effusion and underlying opacity on the right is similar to mildly worsened. Mild right perihilar opacity is worsened. The  cardiomediastinal silhouette is stable. IMPRESSION: 1. Stable right PICC line. 2. Stable infiltrate in the lateral left lung base. 3. Small layering right effusion with underlying opacity is stable to mildly more prominent. There is new right perihilar opacity compared to the previous study. Electronically Signed   By: Dorise Bullion III M.D.   On: 09/27/2021 10:06  ? ?DG CHEST PORT 1 VIEW ? ?Result Date: 09/26/2021 ?CLINICAL DATA:  Acute respiratory failure EXAM: PORTABLE CHEST 1 VIEW COMPARISON:  Chest x-ray dated Sep 25, 2021 FINDINGS: Interval removal of ET tube and feeding tube. Cardiac and mediastinal contours are within normal limits. Unchanged right apical and basilar consolidations. Stable small right pleural effusion. No evidence of pneumothorax. IMPRESSION: 1. Interval removal of ET tube and feeding tube. 2. Unchanged consolidations of the right hemithorax and small right pleural effusion. Electronically Signed   By: Yetta Glassman M.D.   On: 09/26/2021 17:38  ? ?DG Abd Portable 1V ? ?Result Date: 09/27/2021 ?CLINICAL DATA:  NG tube placement EXAM: PORTABLE ABDOMEN - 1 VIEW COMPARISON:  Sep 22, 2021 FINDINGS: The distal tip of the feeding tube is in the left upper quadrant in the region of the proximal stomach. Surgical  staples are identified in this region. Opacity in the right base is stable. No other abnormalities or changes. IMPRESSION: 1. The distal tip of the feeding tube is located in the left upper quadrant, in the region the prox

## 2021-09-28 NOTE — Progress Notes (Signed)
PHARMACY - TOTAL PARENTERAL NUTRITION CONSULT NOTE  ? ?Indication:  intolerance to enteral feeding ? ?Patient Measurements: ?Height: '5\' 9"'$  (175.3 cm) ?Weight: 61.8 kg (136 lb 3.9 oz) ?IBW/kg (Calculated) : 66.2 ?TPN AdjBW (KG): 66.7 ?Body mass index is 20.12 kg/m?. ?Usual Weight:  ? ?Assessment: 89 yoF admitted on 08/31/21 with bloody stools.  PMH significant for homelessness, recurrent GIB, chronic microcytic anemia, hypertension, polysubstance abuse, Roux n Y gastric bypass.  She is s/p endoscopy showing large anastomotic ulceration and is being treated with PPI.  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  ?Dobhoff feeding tube placed, required removal d/t unable to advance to correct position.  Pharmacy is now consulted to dose TPN.   ? ?Glucose / Insulin: hypoglycemia, D10 '@75'$  IVF per MD ?Electrolytes: K low at 2.6 (replaced x5 runs per MD), Mag 2.1 ?Renal: SCr 0.5, BUN up to 47 ?Hepatic: LFTs, Tbili WNL ?Intake / Output; MIVF: D10 '@75'$  ?GI Imaging: ?GI Surgeries / Procedures:  ? ?Central access: PICC ?TPN start date: 5/9  ? ?Nutritional Goals: ?Goal TPN rate is  mL/hr (provides  g of protein and  kcals per day) ? ?RD Assessment: ?Estimated Needs ?Total Energy Estimated Needs: 2150-2350 kcal ?Total Protein Estimated Needs: 100-115 grams ?Total Fluid Estimated Needs: >/= 2.2 L/day ? ?Current Nutrition:  ?NPO ? ?Plan:  ?TPN consult ordered after 12:00, pharmacy will place TPN orders tomorrow to start at 18:00 ?Monitor TPN labs on Mon/Thurs ? ?Gretta Arab PharmD, BCPS ?Clinical Pharmacist ?[facility] [phone] ?09/28/2021 3:01 PM ? ?

## 2021-09-28 NOTE — Progress Notes (Signed)
Date and time results received: 09/28/21 0811 ?(use smartphrase ".now" to insert current time) ? ?Test: Potassium ?Critical Value: 2.6 ? ?Name of Provider Notified: Dr. Tyrell Antonio ? ?Orders Received? Or Actions Taken?: Waiting for orders ?

## 2021-09-29 ENCOUNTER — Inpatient Hospital Stay (HOSPITAL_COMMUNITY): Payer: Medicaid Other

## 2021-09-29 DIAGNOSIS — A4901 Methicillin susceptible Staphylococcus aureus infection, unspecified site: Secondary | ICD-10-CM | POA: Diagnosis not present

## 2021-09-29 LAB — CBC
HCT: 24.3 % — ABNORMAL LOW (ref 36.0–46.0)
Hemoglobin: 7.8 g/dL — ABNORMAL LOW (ref 12.0–15.0)
MCH: 26.7 pg (ref 26.0–34.0)
MCHC: 32.1 g/dL (ref 30.0–36.0)
MCV: 83.2 fL (ref 80.0–100.0)
Platelets: 191 10*3/uL (ref 150–400)
RBC: 2.92 MIL/uL — ABNORMAL LOW (ref 3.87–5.11)
RDW: 24.4 % — ABNORMAL HIGH (ref 11.5–15.5)
WBC: 4.8 10*3/uL (ref 4.0–10.5)
nRBC: 0 % (ref 0.0–0.2)

## 2021-09-29 LAB — COMPREHENSIVE METABOLIC PANEL
ALT: 10 U/L (ref 0–44)
AST: 25 U/L (ref 15–41)
Albumin: 2.5 g/dL — ABNORMAL LOW (ref 3.5–5.0)
Alkaline Phosphatase: 100 U/L (ref 38–126)
Anion gap: 11 (ref 5–15)
BUN: 40 mg/dL — ABNORMAL HIGH (ref 6–20)
CO2: 24 mmol/L (ref 22–32)
Calcium: 7.9 mg/dL — ABNORMAL LOW (ref 8.9–10.3)
Chloride: 103 mmol/L (ref 98–111)
Creatinine, Ser: 0.54 mg/dL (ref 0.44–1.00)
GFR, Estimated: >60 mL/min (ref >60)
Glucose, Bld: 76 mg/dL (ref 70–99)
Potassium: 2.6 mmol/L — CL (ref 3.5–5.1)
Sodium: 138 mmol/L (ref 135–145)
Total Bilirubin: 1.2 mg/dL (ref 0.3–1.2)
Total Protein: 5.7 g/dL — ABNORMAL LOW (ref 6.5–8.1)

## 2021-09-29 LAB — BASIC METABOLIC PANEL
Anion gap: 7 (ref 5–15)
BUN: 36 mg/dL — ABNORMAL HIGH (ref 6–20)
CO2: 25 mmol/L (ref 22–32)
Calcium: 7.8 mg/dL — ABNORMAL LOW (ref 8.9–10.3)
Chloride: 106 mmol/L (ref 98–111)
Creatinine, Ser: 0.55 mg/dL (ref 0.44–1.00)
GFR, Estimated: >60 mL/min (ref >60)
Glucose, Bld: 80 mg/dL (ref 70–99)
Potassium: 3 mmol/L — ABNORMAL LOW (ref 3.5–5.1)
Sodium: 138 mmol/L (ref 135–145)

## 2021-09-29 LAB — GLUCOSE, CAPILLARY
Glucose-Capillary: 79 mg/dL (ref 70–99)
Glucose-Capillary: 68 mg/dL — ABNORMAL LOW (ref 70–99)
Glucose-Capillary: 136 mg/dL — ABNORMAL HIGH (ref 70–99)
Glucose-Capillary: 77 mg/dL (ref 70–99)
Glucose-Capillary: 59 mg/dL — ABNORMAL LOW (ref 70–99)
Glucose-Capillary: 100 mg/dL — ABNORMAL HIGH (ref 70–99)
Glucose-Capillary: 85 mg/dL (ref 70–99)
Glucose-Capillary: 89 mg/dL (ref 70–99)

## 2021-09-29 LAB — PHOSPHORUS: Phosphorus: 4.4 mg/dL (ref 2.5–4.6)

## 2021-09-29 LAB — MAGNESIUM: Magnesium: 1.5 mg/dL — ABNORMAL LOW (ref 1.7–2.4)

## 2021-09-29 LAB — TRIGLYCERIDES: Triglycerides: 12 mg/dL (ref <150)

## 2021-09-29 MED ORDER — POTASSIUM CHLORIDE 10 MEQ/100ML IV SOLN
10.0000 meq | INTRAVENOUS | Status: DC
  Filled 2021-09-29: qty 100

## 2021-09-29 MED ORDER — MAGNESIUM SULFATE 2 GM/50ML IV SOLN
2.0000 g | Freq: Once | INTRAVENOUS | Status: AC
  Administered 2021-09-29: 2 g via INTRAVENOUS
  Filled 2021-09-29: qty 50

## 2021-09-29 MED ORDER — POTASSIUM CHLORIDE 10 MEQ/50ML IV SOLN
10.0000 meq | INTRAVENOUS | Status: AC
  Administered 2021-09-29 (×6): 10 meq via INTRAVENOUS
  Filled 2021-09-29 (×6): qty 50

## 2021-09-29 MED ORDER — DEXTROSE 5 % IV SOLN: INTRAVENOUS | Status: AC

## 2021-09-29 MED ORDER — LEVALBUTEROL HCL 0.63 MG/3ML IN NEBU
0.6300 mg | INHALATION_SOLUTION | Freq: Three times a day (TID) | RESPIRATORY_TRACT | Status: DC
  Administered 2021-09-29 – 2021-10-06 (×21): 0.63 mg via RESPIRATORY_TRACT
  Filled 2021-09-29 (×23): qty 3

## 2021-09-29 MED ORDER — TRAVASOL 10 % IV SOLN
INTRAVENOUS | Status: AC
  Filled 2021-09-29: qty 508.8

## 2021-09-29 MED ORDER — POTASSIUM CHLORIDE 10 MEQ/50ML IV SOLN
10.0000 meq | INTRAVENOUS | Status: AC
  Administered 2021-09-30 (×4): 10 meq via INTRAVENOUS
  Filled 2021-09-29 (×4): qty 50

## 2021-09-29 MED ORDER — FENTANYL CITRATE PF 50 MCG/ML IJ SOSY
12.5000 ug | PREFILLED_SYRINGE | INTRAMUSCULAR | Status: DC | PRN
  Administered 2021-09-29 – 2021-10-07 (×26): 12.5 ug via INTRAVENOUS
  Filled 2021-09-29 (×27): qty 1

## 2021-09-29 NOTE — Progress Notes (Signed)
Hypoglycemic Event ? ?CBG: 68 ? ?Treatment: D50 25 mL (12.5 gm) ? ?Symptoms: None ? ?Follow-up CBG: Time:0750 CBG Result:136 ? ?Possible Reasons for Event: Inadequate meal intake ? ?Comments/MD notified: Dr. Tyrell Antonio ? ? ? ?Terri [name] ? ? ?

## 2021-09-29 NOTE — TOC Progression Note (Signed)
Transition of Care (TOC) - Progression Note  ? ? ?Patient Details  ?Name: Terri [name] ?MRN: [id number] ?Date of Birth: 31-Mar-1976 ? ?Transition of Care (TOC) CM/SW Contact  ?Ross Ludwig, LCSW ?Phone Number: ?09/29/2021, 4:56 PM ? ?Clinical Narrative:    ? ?CSW continuing to follow patient's progress throughout discharge planning.  PT recommended SNF, however patient only has Medicaid.  Patient also has history of polysubstance abuse, which will make it more difficult to find a SNF willing to accept patient.  CSW will notify difficult to place team to follow patient. ? ?Expected Discharge Plan: Home/Self Care ?Barriers to Discharge: Continued Medical Work up ? ?Expected Discharge Plan and Services ?Expected Discharge Plan: Home/Self Care ?  ?Discharge Planning Services: CM Consult ?  ?Living arrangements for the past 2 months: No permanent address ?                ?  ?  ?  ?  ?  ?  ?  ?  ?  ?  ? ? ?Social Determinants of Health (SDOH) Interventions ?  ? ?Readmission Risk Interventions ? ?  09/02/2021  ?  2:25 PM  ?Readmission Risk Prevention Plan  ?Transportation Screening Complete  ?Medication Review Press photographer) Complete  ?PCP or Specialist appointment within 3-5 days of discharge Complete  ?Palliative Care Screening Not Applicable  ?[place] Not Applicable  ? ? ?

## 2021-09-29 NOTE — Progress Notes (Signed)
Modified Barium Swallow Progress Note ? ?Patient Details  ?Name: VESNA KABLE ?MRN: [id number] ?Date of Birth: Jul 18, 1975 ? ?Today's Date: 09/29/2021 ? ?Modified Barium Swallow completed.  Full report located under Chart Review in the Imaging Section. ? ?Brief recommendations include the following: ? ?Clinical Impression ? MBS completed with minimal amount of barium of thin, nectar, and cracker with pudding administered.  Moderate oral and functional pharyngo-cervical esophageal swallow present.   Pharyngeal swallow is strong and timely without aspiration or penetration of any consistency tested. Mastication and oral transit prolonged with solids (lack of dentition likely exacerbates deficit).  Due to extent of oral deficits, recommend consider full liquid diet at this time with strict precautions.   SLP will follow clinically briefly to assure po tolerance and for dietary advancement indications. Ot note, pt cough prior to and during MBS without barium visualized in larynx/trachea. ?  ?Swallow Evaluation Recommendations ? ?   ? ? SLP Diet Recommendations: Thin liquid (full liquids) ? ? Liquid Administration via: Cup;Straw ? ? Medication Administration: Whole meds with liquid (as tolerated) ? ? Supervision: Patient able to self feed ? ? Compensations: Slow rate;Small sips/bites ? ? Postural Changes: Remain semi-upright after after feeds/meals (Comment);Seated upright at 90 degrees ? ? Oral Care Recommendations: Oral care BID ? ?   ?[name], MS CCC SLP ?Acute Rehab Services ?Office [phone] ?Pager [phone] ? ? ? ?Macario Golds ?09/29/2021,12:59 PM ?

## 2021-09-29 NOTE — Progress Notes (Signed)
MBS completed with minimal amount of barium of thin, nectar, and cracker with pudding administered. Full report to follow.  Moderate oral and functional pharyngo-cervical esophageal swallow present. Pt without aspiration or penetration of any consistency tested. Mastication and oral transit prolonged with solids (lack of dentition likely exacerbates deficit).  Recommend consider full liquid diet at this time with strict precautions.   SLP will follow clinically briefly to assure po tolerance and for dietary advancement indications.   Educated pt to recommendations during evaluation.  ? ?[name], MS [facility] SLP ?Acute Rehab Services ?Office [phone] ?Pager [phone] ? ?

## 2021-09-29 NOTE — Progress Notes (Signed)
Hypoglycemic Event ? ?CBG: 58 ? ? ?Treatment: D50 25 mL (12.5 gm) ? ?Symptoms: None ? ?Follow-up CBG: Time:1636 CBG Result:100 ? ?Possible Reasons for Event: Inadequate meal intake ? ?Comments/MD notified: Dr. Tyrell Antonio ? ? ? ?

## 2021-09-29 NOTE — Progress Notes (Signed)
?PROGRESS NOTE ? ? ? ?Terri [name]  HEN:[id number] DOB: February 16, 1976 DOA: 08/31/2021 ?PCP: Pcp, No  ? ?Brief Narrative: ?46 year old with past medical history significant for homelessness, recurrent GIB, chronic microcytic anemia, hypertension, polysubstance abuse, Roux n Y presented to [facility] hospital complaining of dark color related stool.  Patient has history of chronic GI bleed secondary to peptic ulcers.  She underwent endoscopy with large anastomotic ulceration noted and is started on PPI.  Hemoglobin is stabilized. ? ?She was subsequently found to have MSSA bacteremia that appears to be disseminated.  Her left great toe was noted to be gangrenous, vascular surgery and orthopedics determined amputation will be best route for patient and this was done on 4/26.  Culture from great toe grew with MRSA and patient was broadened to vancomycin.  She was found to have multiple liver abscess, and history of portal vein thrombosis. ? ?During hospital course she started to require increased oxygen support, ultimately transferred to ICU and intubated. ? ?Hospital events: ? ?4/10 admitted ?4/13 egd for gib ?4/15 vascular surgery consulted for gangrenous L toe ?4/18 ID consulted for mssa infection, liver abscess, osteo, mrsa pneumonia ?4/23 Thoracentesis, culture negative, exudative ?4/26: ortho consulted for amputation of L great toe ?4/27 psych consulted for capacity, pt was placed on ivc ?4/28: transferred to ICU for increasing oxygen requirement ?4/29: ccm consulted  ?4/30 - Patient intubated  ?Palliative care consult - full code ?5/1 - [place] groin left - no abscess, Moderate Ascites + ?5/2 - Faild SBT.  Remains on propofol and fentanyl.  ?S/p 1 unit probc ?Pall care consult ordeed ?ID consult - noted ?Eso candidadis - finished diflucan ?MRSA pna, great toe osteo s/p amputation - change vanco to teflaro ?Polymicrobial hepatic abscess -> change unasyn to teflaro ?09/23/21:  fever curve better. On vent Fio2 30%, Diprivan  gtt, Fent gtt. Hgb again < 7gm% and 1 unit prbc given. On vent. Ex boyrfriend Fritz Pickerel at bedside ?Pallative care - reestablished ?09/24/21 - 40% fio2, afebrile since 09/22/21. WBC normal. +16L volume overload.  Higher sedation needs ?5/5: Passed spontaneous breathing trial extubated.  Diuresis continued, trialing albumin and Lasix. ?5/07: Transfer to [facility] ? ? ? ?Assessment & Plan: ?  ?Principal Problem: ?  Disseminated MSSA with liver abscess, left foot osteo and abscess, 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 ?Active Problems: ?  Multilobar pneumonia with acute hypoxic respiratory failure ([place]) ?  Hepatic abscess ?  Acute respiratory failure with hypoxia ([place]) ?  Acute on chronic blood loss anemia ?  Swelling of left hand ?  Portal vein thrombosis ?  Exudative pleural effusion ?  Involuntary commitment ?  Ascites ?  Vaginal discharge ?  Polysubstance abuse ([place]) ?  Hypophosphatemia ?  Candida infection, esophageal ([place]) ?  Opioid abuse with opioid-induced mood disorder ([place]) ?  Leucocytosis ?  Protein-calorie malnutrition, severe ? ?1-Acute hypoxic hypercarbic respiratory failure ?Intubated/30/2023 due to MRSA pneumonia ?Bilateral pleural effusion, right more than left ?-Status post right thoracentesis 4/23, exudative based on pleural fluid study.  Culture negative. ?Extubation 5/5. ?-Chest x-ray 5/7: Stable infiltrate in the lateral left lung base, small layering right effusion with underlying opacities stable to mildly more prominent.  There is new right perihilar opacity ?-Treated with  Teflaro and IV flagyl. Had almost 10 days coverage for MRSA PNA>  ?-Currently on 3 L high flow oxygen ?-Antibiotics change to Unasyn. To target MSSA hepatic abscess, will also cover Bacteroids.  ?-Per ID she will need TEE when stable for resp status.  ? ?  2-Volume overload, pulmonary edema acute ?Received IV lasix and albumin 5/07 ?Oxygen requirement down to 3 L.  ?Monitor volume status. . Hold on IV lasix- ? ? 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  ?Completed 10 days coverage MRSA PNA>  ?Follow ID recommendation ?Now on Unasyn to cover Liver Abscess.  ?She will need TEE when more stable.  ? ?Severe protein caloric malnutrition and failure to thrive ?She failed a swallow evaluation.  She will need core track for tube feedings ?Started  IV thiamine ?Core track unable to be advance by IR> will ask GI to see.  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  He will not be possible to place a tube in her stomach due to limited space.  She also has a large anastomotic ulcer which complicate things.  We can consider surgery consult for a J-tube placement due to her anatomy. ?-started on TPN 5/08.  She is probably very weak to undergoing surgical procedure for J tube placement. She pass swallow evaluation, started on full liquid diet. Hopefully we can discontinue TPN soon.  ? ?Left groin possible abscess versus mass; ?5 cm mass left groin and Doppler X lower extremity 09/20/2021. ?Not seen on nonvascular ultrasound of left groin 5/1 ?Will need follow-up ? ?Acute encephalopathy due to sepsis, complicated further by chronic narcotic dependence. ?Has been seen by psych and deemed not to have capacity to make medical decisions ?She is calm.  ? ?History of portal vein thrombosis: No candidate for anticoagulation.  ? ?Acute GI bleed //acute blood loss anemia;  ?admitted with a hemoglobin of 4.6 superimposed on chronic microcytic anemia ?Endoscopy: Large anastomotic Ulceration.  ?On IV PPI.  ?Hb down to 7.9  monitor. Repeat labs in am.  ? ?Thrombocytopenia:  ?Likely related to infection ?Resolved ? ?Hypokalemia: Replaced IV with 6 runs ? ?Hypoglycemia: Change fluids to D 10 . Started on TPN ? ?Homelessness ?Polysubstance abuse ?Psych was consulted and recommended IVC, may need to revisit capacity again  prior to discharge if cooperation becomes an issue. ? ? ? ? ?Vitamin D deficiency ?On vitamin D supplements ? ? ?Pressure Injury 09/24/21 Sacrum Medial Deep Tissue Pressure Injury - Purple or maroon localized area of discolored intact skin or blood-filled blister due to damage of underlying soft tissue from pressure and/or shear. several deep tissue injuries, dark  (Active)  ?09/24/21 1100  ?Location: Sacrum  ?Location Orientation: Medial  ?Staging: Deep Tissue Pressure Injury - Purple or maroon localized area of discolored intact skin or blood-filled blister due to damage of underlying soft tissue from pressure and/or shear.  ?Wound Description (Comments): several deep tissue injuries, dark purple, non-blanchable  ?Present on Admission: No  ?Dressing Type Foam - Lift dressing to assess site every shift 09/28/21 2000  ?   ?Pressure Injury 09/24/21 Buttocks Left;Posterior;Proximal Deep Tissue Pressure Injury - Purple or maroon localized area of discolored intact skin or blood-filled blister due to damage of underlying soft tissue from pressure and/or shear. several purple,  (Active)  ?09/24/21 1100  ?Location: Buttocks  ?Location Orientation: Left;Posterior;Proximal  ?Staging: Deep Tissue Pressure Injury - Purple or maroon localized area of discolored intact skin or blood-filled blister due to damage of underlying soft tissue from pressure and/or shear.  ?Wound Description (Comments): several purple, non-blanchable  ?Present on Admission: No  ?Dressing Type Foam - Lift dressing to  assess site every shift 09/28/21 2000  ?   ?Pressure Injury 09/24/21 Vertebral column Lower Deep Tissue Pressure Injury - Purple or maroon localized area of discolored intact skin or blood-filled blister due to damage of underlying soft tissue from pressure and/or shear. spinal column deep tissue, (Active)  ?09/24/21 1100  ?Location: Vertebral column  ?Location Orientation: Lower  ?Staging: Deep Tissue Pressure Injury - Purple or maroon  localized area of discolored intact skin or blood-filled blister due to damage of underlying soft tissue from pressure and/or shear.  ?Wound Description (Comments): spinal column deep tissue, purple, non-bla

## 2021-09-29 NOTE — Progress Notes (Signed)
Date and time results received: 09/29/21 1020 ?(use smartphrase ".now" to insert current time) ? ?Test: Potassium ?Critical Value: 2.6 ? ?Name of Provider Notified: Dr. Tyrell Antonio ?

## 2021-09-29 NOTE — Progress Notes (Signed)
PHARMACY - TOTAL PARENTERAL NUTRITION CONSULT NOTE  ? ?Indication:  intolerance to enteral feeding ? ?Patient Measurements: ?Height: '5\' 9"'$  (175.3 cm) ?Weight: 54.8 kg (120 lb 13 oz) ?IBW/kg (Calculated) : 66.2 ?TPN AdjBW (KG): 66.7 ?Body mass index is 17.84 kg/m?. ?Usual Weight:  ? ?Assessment: 25 yoF admitted on 08/31/21 with bloody stools.  PMH significant for homelessness, recurrent GIB, chronic microcytic anemia, hypertension, polysubstance abuse, Roux n Y gastric bypass.  She is s/p endoscopy showing large anastomotic ulceration and is being treated with PPI.  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  ?Dobhoff feeding tube placed, required removal d/t unable to advance to correct position.  Pharmacy is now consulted to dose TPN.   ? ?Glucose / Insulin: CBGs 70-80s on D10 '@75'$  IVF per MD ?Electrolytes: K 2.6 (KCl x6 runs per MD), Mag decreased to 1.5 (mag 2g per MD).  Other elytes wnl including Na, Phos, CorrCa 9.1 ?Renal: SCr 0.5, BUN 40 ?Hepatic: LFTs, Tbili WNL.  Albumin 2.5 ?Intake / Output; MIVF: D10 '@75'$  ?GI Imaging: ?GI Surgeries / Procedures:  ? ?Central access: PICC ?TPN start date: 5/9  ? ?Nutritional Goals: ?Goal TPN rate is 90 mL/hr (provides 114 g of protein and 2281 kcals per day) ? ?RD Assessment: ?Estimated Needs ?Total Energy Estimated Needs: 2150-2350 kcal ?Total Protein Estimated Needs: 100-115 grams ?Total Fluid Estimated Needs: >/= 2.2 L/day ? ?Current Nutrition:  ?NPO ? ?Plan:  ?Now:  KCl per MD ?At 18:00:  Start TPN at 35m/hr ?Electrolytes in TPN: Na 559m/L, K 7062mL, Ca 5mE16m, Mg 5mEq20m and Phos 15mmo2m Cl:Ac 1:1 ?Add standard MVI and trace elements to TPN ?Initiate Sensitive q6h SSI and adjust as needed  ?MIVF: when TPN starts, change to D5 at 35 mL/hr ?Monitor TPN labs on Mon/Thurs, BMET/Mag/Phos daily x3 days (Wed-Friday) ?Monitor closely for re-feeding ?D/C MVI, mag ox, B12, fI09c acid per tube (no access,  giving in TPN)  ?Follow up barium swallow results/enteral intake.   ? ? ? ?ChristGretta ArabD, BCPS ?Clinical Pharmacist ?[facility] [phone] 7:03 AM ? ?

## 2021-09-29 NOTE — Progress Notes (Signed)
NUTRITION NOTE ? ?Full follow-up note yesterday. Able to talk with RN who shares plan for MBS today at 1200. ? ?Patient resting in bed. 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 ? ?Triple lumen PICC placed in R basilic on 1/32. TPN to start at 40 ml/hr at 1800 today. High refeeding risk. Goal TPN rate is 90 ml/hr which will provide 2281 kcal and 114 grams protein. ? ?Labs reviewed; K: 2.6 mmol/l, Phos WDL, Mg: 1.5 mg/dl. ? ? ? ?Estimated Nutritional Needs:  ?Kcal:  2150-2350 kcal ?Protein:  100-115 grams ?Fluid:  >/= 2.2 L/day ? ? ? ?Jarome Matin, MS, RD, LDN ?Registered Dietitian II ?Inpatient Clinical Nutrition ?RD pager # and on-call/weekend pager # available in [place]  ? ?

## 2021-09-30 DIAGNOSIS — K922 Gastrointestinal hemorrhage, unspecified: Secondary | ICD-10-CM | POA: Diagnosis not present

## 2021-09-30 DIAGNOSIS — B3781 Candidal esophagitis: Secondary | ICD-10-CM | POA: Diagnosis not present

## 2021-09-30 DIAGNOSIS — F1114 Opioid abuse with opioid-induced mood disorder: Secondary | ICD-10-CM

## 2021-09-30 DIAGNOSIS — J9601 Acute respiratory failure with hypoxia: Secondary | ICD-10-CM | POA: Diagnosis not present

## 2021-09-30 DIAGNOSIS — E43 Unspecified severe protein-calorie malnutrition: Secondary | ICD-10-CM

## 2021-09-30 DIAGNOSIS — K750 Abscess of liver: Secondary | ICD-10-CM | POA: Diagnosis not present

## 2021-09-30 DIAGNOSIS — J181 Lobar pneumonia, unspecified organism: Secondary | ICD-10-CM | POA: Diagnosis not present

## 2021-09-30 LAB — RETICULOCYTES
Immature Retic Fract: 7.9 % (ref 2.3–15.9)
RBC.: 3.58 MIL/uL — ABNORMAL LOW (ref 3.87–5.11)
Retic Count, Absolute: 79.5 10*3/uL (ref 19.0–186.0)
Retic Ct Pct: 2.2 % (ref 0.4–3.1)

## 2021-09-30 LAB — IRON AND TIBC
Iron: 37 ug/dL (ref 28–170)
Saturation Ratios: 34 % — ABNORMAL HIGH (ref 10.4–31.8)
TIBC: 107 ug/dL — ABNORMAL LOW (ref 250–450)
UIBC: 70 ug/dL

## 2021-09-30 LAB — BASIC METABOLIC PANEL
Anion gap: 7 (ref 5–15)
BUN: 31 mg/dL — ABNORMAL HIGH (ref 6–20)
CO2: 24 mmol/L (ref 22–32)
Calcium: 7.9 mg/dL — ABNORMAL LOW (ref 8.9–10.3)
Chloride: 105 mmol/L (ref 98–111)
Creatinine, Ser: 0.46 mg/dL (ref 0.44–1.00)
GFR, Estimated: >60 mL/min (ref >60)
Glucose, Bld: 78 mg/dL (ref 70–99)
Potassium: 3.3 mmol/L — ABNORMAL LOW (ref 3.5–5.1)
Sodium: 136 mmol/L (ref 135–145)

## 2021-09-30 LAB — CBC
HCT: 28 % — ABNORMAL LOW (ref 36.0–46.0)
Hemoglobin: 9.6 g/dL — ABNORMAL LOW (ref 12.0–15.0)
MCH: 27.2 pg (ref 26.0–34.0)
MCHC: 34.3 g/dL (ref 30.0–36.0)
MCV: 79.3 fL — ABNORMAL LOW (ref 80.0–100.0)
Platelets: 254 10*3/uL (ref 150–400)
RBC: 3.53 MIL/uL — ABNORMAL LOW (ref 3.87–5.11)
RDW: 22.9 % — ABNORMAL HIGH (ref 11.5–15.5)
WBC: 7.6 10*3/uL (ref 4.0–10.5)
nRBC: 0 % (ref 0.0–0.2)

## 2021-09-30 LAB — MISC LABCORP TEST (SEND OUT)
LabCorp test name: 5367
Labcorp test code: 9985

## 2021-09-30 LAB — GLUCOSE, CAPILLARY
Glucose-Capillary: 144 mg/dL — ABNORMAL HIGH (ref 70–99)
Glucose-Capillary: 145 mg/dL — ABNORMAL HIGH (ref 70–99)
Glucose-Capillary: 76 mg/dL (ref 70–99)
Glucose-Capillary: 78 mg/dL (ref 70–99)
Glucose-Capillary: 85 mg/dL (ref 70–99)
Glucose-Capillary: 97 mg/dL (ref 70–99)

## 2021-09-30 LAB — VITAMIN B12: Vitamin B-12: 1498 pg/mL — ABNORMAL HIGH (ref 180–914)

## 2021-09-30 LAB — FOLATE: Folate: 15.4 ng/mL (ref >5.9)

## 2021-09-30 LAB — TRIGLYCERIDES: Triglycerides: 16 mg/dL (ref <150)

## 2021-09-30 LAB — CORTISOL: Cortisol, Plasma: 16.4 ug/dL

## 2021-09-30 LAB — FERRITIN: Ferritin: 603 ng/mL — ABNORMAL HIGH (ref 11–307)

## 2021-09-30 LAB — MAGNESIUM: Magnesium: 2 mg/dL (ref 1.7–2.4)

## 2021-09-30 MED ORDER — POTASSIUM CHLORIDE 10 MEQ/100ML IV SOLN: 10.0000 meq | INTRAVENOUS | Status: DC

## 2021-09-30 MED ORDER — POTASSIUM CHLORIDE 10 MEQ/50ML IV SOLN
10.0000 meq | INTRAVENOUS | Status: AC
  Administered 2021-09-30 (×5): 10 meq via INTRAVENOUS
  Filled 2021-09-30 (×5): qty 50

## 2021-09-30 MED ORDER — HYDRALAZINE HCL 50 MG PO TABS
50.0000 mg | ORAL_TABLET | Freq: Two times a day (BID) | ORAL | Status: DC
  Administered 2021-09-30 – 2021-10-12 (×24): 50 mg via ORAL
  Filled 2021-09-30 (×24): qty 1

## 2021-09-30 MED ORDER — SODIUM CHLORIDE 0.9 % IV SOLN
INTRAVENOUS | Status: DC | PRN
Start: 2021-09-30 — End: 2021-10-12

## 2021-09-30 MED ORDER — HEPARIN SODIUM (PORCINE) 5000 UNIT/ML IJ SOLN
5000.0000 [IU] | Freq: Three times a day (TID) | INTRAMUSCULAR | Status: DC
  Administered 2021-09-30 – 2021-10-11 (×31): 5000 [IU] via SUBCUTANEOUS
  Filled 2021-09-30 (×33): qty 1

## 2021-09-30 MED ORDER — LISINOPRIL 10 MG PO TABS
10.0000 mg | ORAL_TABLET | Freq: Every day | ORAL | Status: DC
  Administered 2021-09-30 – 2021-10-11 (×12): 10 mg via ORAL
  Filled 2021-09-30 (×13): qty 1

## 2021-09-30 MED ORDER — HYDROCODONE-ACETAMINOPHEN 7.5-325 MG/15ML PO SOLN
10.0000 mL | Freq: Four times a day (QID) | ORAL | Status: DC | PRN
Start: 2021-09-30 — End: 2021-10-09
  Administered 2021-09-30 – 2021-10-09 (×16): 10 mL via ORAL
  Filled 2021-09-30 (×17): qty 15

## 2021-09-30 MED ORDER — ALTEPLASE 2 MG IJ SOLR
INTRAMUSCULAR | Status: AC
  Filled 2021-09-30: qty 2

## 2021-09-30 MED ORDER — AMLODIPINE BESYLATE 10 MG PO TABS
10.0000 mg | ORAL_TABLET | Freq: Every day | ORAL | Status: DC
Start: 2021-09-30 — End: 2021-10-12
  Administered 2021-09-30 – 2021-10-12 (×13): 10 mg via ORAL
  Filled 2021-09-30 (×13): qty 1

## 2021-09-30 MED ORDER — HYDRALAZINE HCL 20 MG/ML IJ SOLN
10.0000 mg | Freq: Once | INTRAMUSCULAR | Status: AC
  Administered 2021-09-30: 10 mg via INTRAVENOUS
  Filled 2021-09-30: qty 1

## 2021-09-30 MED ORDER — TRAVASOL 10 % IV SOLN
INTRAVENOUS | Status: AC
  Filled 2021-09-30: qty 763.2

## 2021-09-30 MED ORDER — NITROGLYCERIN IN D5W 200-5 MCG/ML-% IV SOLN
0.0000 ug/min | INTRAVENOUS | Status: DC
  Administered 2021-09-30: 5 ug/min via INTRAVENOUS
  Filled 2021-09-30: qty 250

## 2021-09-30 MED ORDER — ALTEPLASE 2 MG IJ SOLR
2.0000 mg | Freq: Once | INTRAMUSCULAR | Status: AC
  Administered 2021-09-30: 2 mg

## 2021-09-30 NOTE — Progress Notes (Signed)
ANTICOAGULATION CONSULT NOTE - Initial Consult ? ?Pharmacy Consult for Heparin ?Indication: VTE prophylaxis ? ?Allergies  ?Allergen Reactions  ? Nsaids Other (See Comments)  ?  Stomach ulcer ?  ? Ketorolac Tromethamine Itching and Nausea And Vomiting  ? ? ?Patient Measurements: ?Height: '5\' 9"'$  (175.3 cm) ?Weight: 52 kg (114 lb 10.2 oz) ?IBW/kg (Calculated) : 66.2 ?Heparin Dosing Weight:  ? ?Vital Signs: ?Temp: 96.5 ?F (35.8 ?C) (05/10 2100) ?Temp Source: Oral (05/10 1600) ?BP: 136/96 (05/10 2100) ?Pulse Rate: 83 (05/10 2100) ? ?Labs: ?Recent Labs  ?  09/28/21 ?4332 09/29/21 ?0354 09/29/21 ?0905 09/29/21 ?1829 09/30/21 ?0453  ?HGB 9.4* 7.8*  --   --  9.6*  ?HCT 27.5* 24.3*  --   --  28.0*  ?PLT 265 191  --   --  254  ?CREATININE 0.57  --  0.54 0.55 0.46  ? ? ?Estimated Creatinine Clearance: 71.4 mL/min (by C-G formula based on SCr of 0.46 mg/dL). ? ? ?Medical History: ?Past Medical History:  ?Diagnosis Date  ? Arthritis   ? Back and legs  ? Chronic back pain   ? DVT (deep venous thrombosis) ([place])   ? early 20's leg  ? GIB (gastrointestinal bleeding) 09/13/2017  ? History of blood transfusion   ? Hypertension   ? Migraine headache   ? Mood disorder ([place])   ? previously documented as bipolar and MDD  ? Neuropathy   ? Pneumonia   ? 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 Sciatica   ? Stress incontinence   ? ? ?Medications:  ?Scheduled:  ? amLODipine  10 mg Oral Daily  ? chlorhexidine  15 mL Mouth Rinse BID  ? Chlorhexidine Gluconate Cloth  6 each Topical Daily  ? cholecalciferol  1,000 Units Oral Daily  ? cloNIDine  0.1 mg Transdermal Weekly  ? guaiFENesin  5 mL Per Tube Q6H  ? hydrALAZINE  50 mg Oral BID  ? levalbuterol  0.63 mg Nebulization TID  ? lidocaine  1 patch Transdermal Daily  ? lisinopril  10 mg Oral QHS  ? mouth rinse  15 mL Mouth Rinse q12n4p  ? nicotine  21 mg Transdermal Daily  ? pantoprazole (PROTONIX) IV  40 mg Intravenous Q12H  ? sodium chloride flush  10-40 mL Intracatheter Q12H  ? sucralfate  1 g Per  Tube TID WC & HS  ? thiamine injection  100 mg Intravenous Daily  ? ?Infusions:  ? sodium chloride 10 mL/hr at 09/30/21 0800  ? sodium chloride Stopped (09/30/21 1352)  ? ampicillin-sulbactam (UNASYN) IV Stopped (09/30/21 2117)  ? dextrose 35 mL/hr at 09/30/21 1900  ? nitroGLYCERIN 45 mcg/min (09/30/21 1900)  ? TPN ADULT (ION) 60 mL/hr at 09/30/21 1900  ? ? ? ?Assessment:  ?PHARMACY CONSULT: Heparin for VTE prophylaxis  ?Presented to ED on 4/10 with acute GI bleed on admission with low platelets, pharmacological VTE prophylaxis was not started.  SCDs were ordered.   ?- 4/14 MRI: Large burden of thrombus in central portal vein, left and right portal vein.  Holding anticoag.  Not an AC candidate per GI. ?- Transfused last on 5/3. ?- CBC:  Plt improved to 254, Hgb improved to 9.6 ?- CrCl >30 ml/hr  ? ?Plan:  ?Heparin 5000 units SQ q8h  ?MD to monitor CBC, s/s bleeding.   ?Pharmacy will sign off.   ? ?Gretta Arab PharmD, BCPS ?Clinical Pharmacist ?[facility] [phone] ?09/30/2021 9:32 PM ? ? ?

## 2021-09-30 NOTE — Progress Notes (Signed)
PT Cancellation Note ? ?Patient Details ?Name: AYEISHA LINDENBERGER ?MRN: [id number] ?DOB: 10-27-1975 ? ? ?Cancelled Treatment:    Reason Eval/Treat Not Completed: Medical issues which prohibited therapy--spoke with RN-BP elevated-pt just received BP meds. Will check back on tomorrow. ? ? ? ?Doreatha Massed, PT ?Acute Rehabilitation  ?Office: [phone] ?Pager: [phone] ? ?  ?

## 2021-09-30 NOTE — Progress Notes (Signed)
?   ? ?[facility] Disease ? ?Date of Admission:  08/31/2021    ?       ?Reason for visit: Follow up on disseminated Staph infection ? ?Current antibiotics: ?Unasyn ? ?ASSESSMENT:   ? ?46 y.o. female admitted with: ? ?Staph aureus pneumonia: completed 10 day course of MRSA coverage with vancomycin --> ceftaroline due to issues with supratherapeutic vancomycin dosing. ?Hepatic abscess: S/p aspiration 4/17 with cultures growing MSSA and bacteroides.  Currently covered by Unasyn. ?MRSA osteomyelitis of left great toe: Status post amputation.  Cx grew MRSA. ?Back pain: noted worsening today per patient.  She does have sacral decub and pressure ulcers as well as prior MRI lumbar and thoracic spine negative for infection, however, was a poor quality study. ?Hypoxic respiratory failure: Stable on [place]. ?Candida esophagitis: Finished treatment course ?Protein calorie malnutrition: Albumin is 2.5 ? ? ?RECOMMENDATIONS:   ? ?Continue Unasyn ?Will need TEE.  Hopefully able to accomplish soon now that appears improved from respiratory standpoint ?Given her disseminated Staph infection and back pain, will repeat MRI to ensure no infection has developed here ?Wound care, nutrition ?Will follow ? ? ?Principal Problem: ?  Disseminated MSSA with liver abscess, left foot osteo and abscess, 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 ?Active Problems: ?  Opioid abuse with opioid-induced mood disorder ([place]) ?  Leucocytosis ?  Polysubstance abuse ([place]) ?  Acute on chronic blood loss anemia ?  Multilobar pneumonia with acute hypoxic respiratory failure ([place]) ?  Portal vein thrombosis ?  Candida infection, esophageal ([place]) ?  Hepatic abscess ?  Swelling of left hand ?  Ascites ?  Exudative pleural effusion ?  Hypophosphatemia ?  Vaginal discharge ?  Acute respiratory failure with hypoxia ([place]) ?  Involuntary commitment ?  Protein-calorie malnutrition, severe ? ? ? ?MEDICATIONS:   ? ?Scheduled Meds: ? chlorhexidine  15 mL Mouth Rinse BID  ?  Chlorhexidine Gluconate Cloth  6 each Topical Daily  ? cholecalciferol  1,000 Units Oral Daily  ? cloNIDine  0.1 mg Transdermal Weekly  ? guaiFENesin  5 mL Per Tube Q6H  ? levalbuterol  0.63 mg Nebulization TID  ? lidocaine  1 patch Transdermal Daily  ? mouth rinse  15 mL Mouth Rinse q12n4p  ? nicotine  21 mg Transdermal Daily  ? pantoprazole (PROTONIX) IV  40 mg Intravenous Q12H  ? sodium chloride flush  10-40 mL Intracatheter Q12H  ? sucralfate  1 g Per Tube TID WC & HS  ? thiamine injection  100 mg Intravenous Daily  ? ?Continuous Infusions: ? sodium chloride 10 mL/hr at 09/30/21 0600  ? sodium chloride 10 mL/hr at 09/30/21 0817  ? ampicillin-sulbactam (UNASYN) IV Stopped (09/30/21 0849)  ? dextrose 35 mL/hr at 09/30/21 0600  ? potassium chloride 10 mEq (09/30/21 0948)  ? TPN ADULT (ION) 40 mL/hr at 09/30/21 0600  ? ?PRN Meds:.sodium chloride, acetaminophen (TYLENOL) oral liquid 160 mg/5 mL **OR** acetaminophen, dextrose, fentaNYL (SUBLIMAZE) injection, guaiFENesin-dextromethorphan, haloperidol lactate **AND** [DISCONTINUED] LORazepam, hydrALAZINE, lip balm, loperamide HCl, naLOXone (NARCAN)  injection, ondansetron (ZOFRAN) IV, saline, sodium chloride flush ? ?SUBJECTIVE:  ? ?24 hour events:  ?No acute events ?Afebrile ?On nasal cannula ?Remains in ICU ?Hypertensive ?SLP placed her on thin liquid diet ? ? ?She is complaining of worsening back pain today that she noted this week now that she is extubated.  ? ?Review of Systems  ?All other systems reviewed and are negative. ? ?  ?OBJECTIVE:  ? ?Blood pressure (!) 172/111, pulse 68,  temperature 97.7 ?F (36.5 ?C), temperature source Oral, resp. rate (!) 28, height '5\' 9"'$  (1.753 m), weight 52 kg, SpO2 100 %. ?Body mass index is 16.93 kg/m?. ? ?Physical Exam ?Constitutional:   ?   Comments: Thin, frail, ill appearing woman, no distress.   ?HENT:  ?   Head: Normocephalic and atraumatic.  ?Eyes:  ?   Extraocular Movements: Extraocular movements intact.  ?    Conjunctiva/sclera: Conjunctivae normal.  ?Pulmonary:  ?   Effort: Pulmonary effort is normal. No respiratory distress.  ?   Comments: On nasal cannula.  ?Abdominal:  ?   General: There is no distension.  ?   Palpations: Abdomen is soft.  ?Musculoskeletal:  ?   Cervical back: Normal range of motion and neck supple.  ?   Comments: S/p left great toe amp  ?Skin: ?   General: Skin is warm and dry.  ?Neurological:  ?   General: No focal deficit present.  ?   Mental Status: She is oriented to person, place, and time.  ?Psychiatric:     ?   Mood and Affect: Mood normal.     ?   Behavior: Behavior normal.  ? ? ? ?Lab Results: ?Lab Results  ?Component Value Date  ? WBC 7.6 09/30/2021  ? HGB 9.6 (L) 09/30/2021  ? HCT 28.0 (L) 09/30/2021  ? MCV 79.3 (L) 09/30/2021  ? PLT 254 09/30/2021  ?  ?Lab Results  ?Component Value Date  ? NA 136 09/30/2021  ? K 3.3 (L) 09/30/2021  ? CO2 24 09/30/2021  ? GLUCOSE 78 09/30/2021  ? BUN 31 (H) 09/30/2021  ? CREATININE 0.46 09/30/2021  ? CALCIUM 7.9 (L) 09/30/2021  ? GFRNONAA >60 09/30/2021  ? GFRAA >60 07/15/2019  ?  ?Lab Results  ?Component Value Date  ? ALT 10 09/29/2021  ? AST 25 09/29/2021  ? ALKPHOS 100 09/29/2021  ? BILITOT 1.2 09/29/2021  ? ? ?   ?Component Value Date/Time  ? CRP 1.2 (H) 05/29/2021 0346  ? ? ?   ?Component Value Date/Time  ? ESRSEDRATE 20 02/26/2014 0958  ? ?  ?I have reviewed the micro and lab results in Epic. ? ?Imaging: ?DG Naso G Tube Plc W/Fl W/Rad ? ?Result Date: 09/28/2021 ?CLINICAL DATA:  Malnutrition.  Feeding tube advancement requested. EXAM: NASO G TUBE PLACEMENT WITH FL AND WITH RAD CONTRAST:  None. FLUOROSCOPY: Radiation Exposure Index (as provided by the fluoroscopic device): 13.2 mGy Kerma COMPARISON:  Abdominal x-ray from yesterday. FINDINGS: Preliminary x-ray demonstrates feeding tube tip in the proximal stomach. 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 Multiple attempts were made  under fluoroscopic guidance to advanced the feeding tube, both with and without a stiff Amplatz guidewire. The tube could not be advanced any further and the procedure was ultimately stopped at the patient's request due primarily to discomfort in the nasopharynx. The tube was resecured to the patient's nose with final position of the tip unchanged compared to pre-procedure study. IMPRESSION: 1. Feeding tube terminates in the proximal stomach. Unable to advanced any further, likely related to altered anatomy from prior gastric bypass surgery. Electronically Signed   By: Titus Dubin M.D.   On: 09/28/2021 13:31    ? ?Imaging independently reviewed in Epic.  ? ? ?Mignon Pine ?[facility] ?[place] ?[phone] pager ?09/30/2021, 10:03 AM ? ? ?

## 2021-09-30 NOTE — Progress Notes (Addendum)
Speech Language Pathology Treatment: Dysphagia  ?Patient Details ?Name: Terri [name] ?MRN: [id number] ?DOB: 1975/07/21 ?Today's Date: 09/30/2021 ?Time: 4098-1191 ?SLP Time Calculation (min) (ACUTE ONLY): 8 min ? ?Assessment / Plan / Recommendation ?Clinical Impression ? Session was limited as pt did needed encouragement to consume po's with therapist. RN stated she drank grape juice with RN this earlier this morning without incidence. Pt was slightly scooted down in bed and with encouragement she stated she was sore and politely declined to be repositioned upright. She took straw sips thin liquid limited to 3-4 before stating she did not want more to eat or drink. Suspected she may have pharyngeal or esophageal pain with swallow however she denied odonophagia. She had one delayed congested cough unable to determine etiology however she did not penetrate or aspirate during MBS yesteday. SLP encouraged her to allow repositioning when her lunch meal comes for safer swallow and increase po's for meals today. Continue full liquids due to GI issues and ST to continue.  ?  ?HPI HPI: Gastroenterology was consulted, EGD on 09/03/2021 showed esophageal plaques consistent with candidiasis, large cratered nonbleeding duodenal ulcer with no stigmata of bleeding.  bx 4/17 showed acute inflammation with necrosis compatible with abscess, granulomatous inflammation with eosinophilia and fragment of foreign material, negative for malignancy;  CXR Grossly stable right upper lobe pneumonia. Stable mild left basilar atelectasis or infiltrate., incr ox needs thus TEE deferred, 4/23  thora and s/p paracentesis 4/17; ? Endocarditis; cocaine +. Bedside swallow evaluation completed by SLP on 09/15/21 and patient's swallow at that time appeared largely [facility] and no f/u recommended. Patient had to be intubated on 4/30 secondary to tachypneic with accessory muscle use and increased WOB. She was extubated on 09/25/21 and has been NPO awaiting SLP  swallow evaluation. ?  ?   ?SLP Plan ? Continue with current plan of care ? ?  ?  ?Recommendations for follow up therapy are one component of a multi-disciplinary discharge planning process, led by the attending physician.  Recommendations may be updated based on patient status, additional functional criteria and insurance authorization. ?  ? ?Recommendations  ?Diet recommendations: Thin liquid (full liquids) ?Liquids provided via: Straw ?Medication Administration: Whole meds with liquid ?Supervision: Patient able to self feed;Staff to assist with self feeding ?Compensations: Slow rate;Small sips/bites ?Postural Changes and/or Swallow Maneuvers: Seated upright 90 degrees;Upright 30-60 min after meal  ?   ?    ?   ? ? ? ? Oral Care Recommendations: Oral care BID ?Follow Up Recommendations: No SLP follow up ?Assistance recommended at discharge: Frequent or constant Supervision/Assistance ?SLP Visit Diagnosis: Dysphagia, pharyngoesophageal phase (R13.14) ?Plan: Continue with current plan of care ? ? ? ? ?  ?  ? ? ?Houston Siren ? ?09/30/2021, 10:26 AM ?

## 2021-09-30 NOTE — Plan of Care (Signed)
?  Problem: Coping: ?Goal: Level of anxiety will decrease ?Outcome: Progressing ?  ?Problem: Elimination: ?Goal: Will not experience complications related to bowel motility ?Outcome: Not Progressing ?  ?Problem: Pain Managment: ?Goal: General experience of comfort will improve ?Outcome: Not Progressing ?  ?Problem: Skin Integrity: ?Goal: Risk for impaired skin integrity will decrease ?Outcome: Not Progressing ?  ?

## 2021-09-30 NOTE — Progress Notes (Signed)
?PROGRESS NOTE ? ? ? ?Terri [name]  QMG:[id number] DOB: [birth year] DOA: 08/31/2021 ?PCP: Pcp, No  ? ? ? ?Brief Narrative:  ?46 year old with past medical history significant for homelessness, recurrent GIB, chronic microcytic anemia, hypertension, polysubstance abuse, Roux n Y presented to [facility] complaining of dark color related stool.  Patient has history of chronic GI bleed secondary to peptic ulcers.  She underwent endoscopy with large anastomotic ulceration noted and is started on PPI.  Hemoglobin is stabilized. ?  ?She was subsequently found to have MSSA bacteremia that appears to be disseminated.  Her left great toe was noted to be gangrenous, vascular surgery and orthopedics determined amputation will be best route for patient and this was done on 4/26.  Culture from great toe grew with MRSA and patient was broadened to vancomycin.  She was found to have multiple liver abscess, and history of portal vein thrombosis. ?  ?During hospital course she started to require increased oxygen support, ultimately transferred to ICU and intubated. ? ? ?Subjective: ?A/O x4, currently in pain from multiple ulcers on her back, and s/p LEFT great toe amputation. ? ? ?Assessment & Plan: ?Covid vaccination; ?  ?Principal Problem: ?  Disseminated MSSA with liver abscess, left foot osteo and abscess, 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 ?Active Problems: ?  Multilobar pneumonia with acute hypoxic respiratory failure ([place]) ?  Hepatic abscess ?  Acute respiratory failure with hypoxia ([place]) ?  Acute on chronic blood loss anemia ?  Swelling of left hand ?  Portal vein thrombosis ?  Exudative pleural effusion ?  Involuntary commitment ?  Ascites ?  Vaginal discharge ?  Polysubstance abuse ([place]) ?  Hypophosphatemia ?  Candida infection, esophageal ([place]) ?  Opioid abuse with opioid-induced mood disorder ([place]) ?  Leucocytosis ?  Protein-calorie malnutrition, severe ? ?Acute respiratory failure with hypoxia and hypercapnia/positive MRSA  pneumonia ?-Intubated 09/20/2021  ?-5/5 Extubation ?-Treated  with Teflaro and IV Flagyl initially.  ?-Completed 10 days coverage MRSA PNA>  ?-Follow ID recommendation ? ?Polymicrobial liver abscess (MSSA hepatic abscess) ?-Now on Unasyn to cover Liver Abscess. Will also cover Bacteroids ?-She will need TEE when more stable, per ID.  ? ?Bilateral pleural effusion, right>> left ?-4/23 s/p status post RIGHT thoracentesis; exudative based on pleural fluid study.  Culture negative. ?-Chest x-ray 5/7: Stable infiltrate in the lateral left lung base, small layering right effusion with underlying opacities stable to mildly more prominent.  There is new right perihilar opacity ? ? ?Volume overload, pulmonary edema acute ?-Received IV lasix and albumin 5/07 ?-Strict in and out ?- Daily weight ? ?Hypertensive urgency/Essential HTN ?-5/10 amlodipine 10 mg daily ?- 5/10 Catapres 0.1 mg transdermal ?-5/10 hydralazine 50 mg BID ?-5/10 hydralazine IV PRN ?-5/10 lisinopril 10 mg daily ?- 5/10 NTG drip goal SBP 110-130 ?  ?Disseminated MSSA infection, osteo LEFT great toe  ?-S/p status post amputation ? ?Severe protein caloric malnutrition and failure to thrive ?She failed a swallow evaluation.  She will need core track for tube feedings ?Started  IV thiamine ?Core track unable to be advance by IR> will ask GI to see.  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  He will not be possible to place a tube in her stomach due to limited space.  She also has a large anastomotic ulcer which complicate things.  We can consider surgery consult for a J-tube placement due to her anatomy. ?-started on TPN 5/08.  She is  probably very weak to undergoing surgical procedure for J tube placement. She passed swallow evaluation, started on full liquid diet. Hopefully we can discontinue TPN soon.  ?  ?Left groin possible abscess versus mass; ?5 cm mass left groin and Doppler X lower extremity 09/20/2021. ?Not  seen on nonvascular ultrasound of left groin 5/1 ?Will need follow-up ?  ?Acute encephalopathy  ?-Multifactorial sepsis, Chronic narcotic dependence. ?-Has been seen by psych and deemed not to have capacity to make medical decisions ?-Resolved ?  ?History of portal vein thrombosis:  ?-No candidate for anticoagulation.  ?  ?Acute GI bleed //Acute blood loss anemia;  ?admitted with a hemoglobin of 4.6 superimposed on chronic microcytic anemia ?Endoscopy: Large anastomotic Ulceration.  ?On IV PPI.  ?Lab Results  ?Component Value Date  ? HGB 9.6 (L) 09/30/2021  ? HGB 7.8 (L) 09/29/2021  ? HGB 9.4 (L) 09/28/2021  ? HGB 9.1 (L) 09/27/2021  ? HGB 9.4 (L) 09/26/2021  ?-Stable ?  ?Anemia ?-Anemia panel pending ? ?Thrombocytopenia:  ?Likely related to infection ?Resolved ?  ?Hypokalemia: ?-Potassium goal> 4 ?-5/10 Potassium IV 50 mEq ?  ?Hypoglycemia:  ?-Change fluids to D 10 . Started on TPN ?  ?Homelessness ? ? ?Polysubstance abuse ?-4/10 positive cocaine ?Psych was consulted and recommended IVC, may need to revisit capacity again prior to discharge if cooperation becomes an issue. ?  ?Vitamin D deficiency ?On vitamin D supplements ? ?Severe protein calorie malnutrition ?-Continue TPN ?-Cleared by speech for full liquid diet ? ? ?Pressure Injury 09/24/21 Sacrum Medial Deep Tissue Pressure Injury - Purple or maroon localized area of discolored intact skin or blood-filled blister due to damage of underlying soft tissue from pressure and/or shear. several deep tissue injuries, dark  (Active)  ?09/24/21 1100  ?Location: Sacrum  ?Location Orientation: Medial  ?Staging: Deep Tissue Pressure Injury - Purple or maroon localized area of discolored intact skin or blood-filled blister due to damage of underlying soft tissue from pressure and/or shear.  ?Wound Description (Comments): several deep tissue injuries, dark purple, non-blanchable  ?Present on Admission: No  ?Dressing Type Foam - Lift dressing to assess site every shift  09/29/21 2000  ?   ?Pressure Injury 09/24/21 Buttocks Left;Posterior;Proximal Deep Tissue Pressure Injury - Purple or maroon localized area of discolored intact skin or blood-filled blister due to damage of underlying soft tissue from pressure and/or shear. several purple,  (Active)  ?09/24/21 1100  ?Location: Buttocks (gluteal fold)  ?Location Orientation: Left;Posterior;Proximal  ?Staging: Deep Tissue Pressure Injury - Purple or maroon localized area of discolored intact skin or blood-filled blister due to damage of underlying soft tissue from pressure and/or shear.  ?Wound Description (Comments): several purple, non-blanchable  ?Present on Admission: No  ?Dressing Type Foam - Lift dressing to assess site every shift 09/29/21 2000  ?   ?Pressure Injury 09/24/21 Vertebral column Lower Deep Tissue Pressure Injury - Purple or maroon localized area of discolored intact skin or blood-filled blister due to damage of underlying soft tissue from pressure and/or shear. spinal column deep tissue, (Active)  ?09/24/21 1100  ?Location: Vertebral column  ?Location Orientation: Lower  ?Staging: Deep Tissue Pressure Injury - Purple or maroon localized area of discolored intact skin or blood-filled blister due to damage of underlying soft tissue from pressure and/or shear.  ?Wound Description (Comments): spinal column deep tissue, purple, non-blanchable  ?Present on Admission: No  ?Dressing Type Foam - Lift dressing to assess site every shift 09/29/21 2000  ?   ?Pressure Injury 09/24/21 Vertebral  column Right;Upper Deep Tissue Pressure Injury - Purple or maroon localized area of discolored intact skin or blood-filled blister due to damage of underlying soft tissue from pressure and/or shear. purple, non-blanchab (Active)  ?09/24/21 1100  ?Location: Vertebral column  ?Location Orientation: Right;Upper  ?Staging: Deep Tissue Pressure Injury - Purple or maroon localized area of discolored intact skin or blood-filled blister due to  damage of underlying soft tissue from pressure and/or shear.  ?Wound Description (Comments): purple, non-blanchable  ?Present on Admission: No  ?Dressing Type Foam - Lift dressing to assess site every shift

## 2021-09-30 NOTE — Plan of Care (Signed)
?  Problem: Education: ?Goal: Ability to identify signs and symptoms of gastrointestinal bleeding will improve ?Outcome: Progressing ?  ?Problem: Bowel/Gastric: ?Goal: Will show no signs and symptoms of gastrointestinal bleeding ?Outcome: Progressing ?  ?Problem: Clinical Measurements: ?Goal: Complications related to the disease process, condition or treatment will be avoided or minimized ?Outcome: Progressing ?  ?Problem: Health Behavior/Discharge Planning: ?Goal: Ability to manage health-related needs will improve ?Outcome: Progressing ?  ?Problem: Clinical Measurements: ?Goal: Ability to maintain clinical measurements within normal limits will improve ?Outcome: Progressing ?Goal: Will remain free from infection ?Outcome: Progressing ?Goal: Diagnostic test results will improve ?Outcome: Progressing ?Goal: Respiratory complications will improve ?Outcome: Progressing ?Goal: Cardiovascular complication will be avoided ?Outcome: Progressing ?  ?Problem: Activity: ?Goal: Risk for activity intolerance will decrease ?Outcome: Progressing ?  ?Problem: Nutrition: ?Goal: Adequate nutrition will be maintained ?Outcome: Progressing ?  ?Problem: Coping: ?Goal: Level of anxiety will decrease ?Outcome: Progressing ?  ?Problem: Elimination: ?Goal: Will not experience complications related to bowel motility ?Outcome: Progressing ?Goal: Will not experience complications related to urinary retention ?Outcome: Progressing ?  ?Problem: Pain Managment: ?Goal: General experience of comfort will improve ?Outcome: Progressing ?  ?Problem: Safety: ?Goal: Ability to remain free from injury will improve ?Outcome: Progressing ?  ?Problem: Skin Integrity: ?Goal: Risk for impaired skin integrity will decrease ?Outcome: Progressing ?  ?Cindy S. Brigitte Pulse BSN, RN, CCRP ?09/30/2021 3:42 AM ?

## 2021-09-30 NOTE — Progress Notes (Signed)
PHARMACY - TOTAL PARENTERAL NUTRITION CONSULT NOTE  ? ?Indication:  intolerance to enteral feeding ? ?Patient Measurements: ?Height: '5\' 9"'$  (175.3 cm) ?Weight: 52 kg (114 lb 10.2 oz) ?IBW/kg (Calculated) : 66.2 ?TPN AdjBW (KG): 66.7 ?Body mass index is 16.93 kg/m?. ?Usual Weight:  ? ?Assessment: 42 yoF admitted on 08/31/21 with bloody stools.  PMH significant for homelessness, recurrent GIB, chronic microcytic anemia, hypertension, polysubstance abuse, Roux n Y gastric bypass.  She is s/p endoscopy showing large anastomotic ulceration and is being treated with PPI.  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  ?Dobhoff feeding tube placed, required removal d/t unable to advance to correct position.  Pharmacy is now consulted to dose TPN.   ? ?Glucose / Insulin: CBGs 70-80s not on IVFs any longer ?Electrolytes: K 3.3 (KCl x6 runs per MD), Mag increased to 2 (mag 2g per MD).  Other elytes wnl including Na, Phos ?Renal: SCr 0.46, BUN 31 ?Hepatic: LFTs, Tbili WNL, Albumin 2.5 ?Intake / Output; MIVF: 2.89L/2.8L - net +12m ?GI Imaging: ?GI Surgeries / Procedures:  ? ?Central access: PICC ?TPN start date: 5/9  ? ?Nutritional Goals: ?Goal TPN rate is 90 mL/hr (provides 114 g of protein and 2281 kcals per day) ? ?RD Assessment: ?Estimated Needs ?Total Energy Estimated Needs: 2150-2350 kcal ?Total Protein Estimated Needs: 100-115 grams ?Total Fluid Estimated Needs: >/= 2.2 L/day ? ?Current Nutrition:  ?FL diet started 5/9 based on speech rec's ? ?Plan:  ?Now:  KCl per MD ?At 18:00:  Increase TPN from 485mhr to 60 ml/hr ?Electrolytes in TPN: Na 5073mL, K 20m57m, Ca 5mEq105m Mg 5mEq/86mand Phos 15mmol47mCl:Ac 1:1 ?Add standard MVI and trace elements to TPN ?Continue Sensitive q6h SSI and adjust as needed  ?MIVF: D5 at 35 mL/hr - no changes ?Monitor TPN labs on Mon/Thurs, BMET/Mag/Phos daily x3 days (Wed-Friday) ?Monitor closely for re-feeding ?D/C MVI, mag  ox, B12, foB63 acid per tube (no access, giving in TPN)  ?Follow up barium swallow results/enteral intake - FL diet started ? ? ? ?Nana Vastine Adrian SaranD, BCPS ?Secure Chat if ?s ?09/30/2021 10:30 AM ? ?

## 2021-10-01 DIAGNOSIS — B3781 Candidal esophagitis: Secondary | ICD-10-CM | POA: Diagnosis not present

## 2021-10-01 DIAGNOSIS — E43 Unspecified severe protein-calorie malnutrition: Secondary | ICD-10-CM | POA: Diagnosis not present

## 2021-10-01 DIAGNOSIS — J9601 Acute respiratory failure with hypoxia: Secondary | ICD-10-CM | POA: Diagnosis not present

## 2021-10-01 DIAGNOSIS — R531 Weakness: Secondary | ICD-10-CM

## 2021-10-01 DIAGNOSIS — K922 Gastrointestinal hemorrhage, unspecified: Secondary | ICD-10-CM | POA: Diagnosis not present

## 2021-10-01 DIAGNOSIS — Z515 Encounter for palliative care: Secondary | ICD-10-CM | POA: Diagnosis not present

## 2021-10-01 DIAGNOSIS — A4901 Methicillin susceptible Staphylococcus aureus infection, unspecified site: Secondary | ICD-10-CM | POA: Diagnosis not present

## 2021-10-01 DIAGNOSIS — K750 Abscess of liver: Secondary | ICD-10-CM | POA: Diagnosis not present

## 2021-10-01 DIAGNOSIS — Z7189 Other specified counseling: Secondary | ICD-10-CM | POA: Diagnosis not present

## 2021-10-01 LAB — COMPREHENSIVE METABOLIC PANEL
ALT: 12 U/L (ref 0–44)
AST: 24 U/L (ref 15–41)
Albumin: 2.2 g/dL — ABNORMAL LOW (ref 3.5–5.0)
Alkaline Phosphatase: 102 U/L (ref 38–126)
Anion gap: 6 (ref 5–15)
BUN: 24 mg/dL — ABNORMAL HIGH (ref 6–20)
CO2: 21 mmol/L — ABNORMAL LOW (ref 22–32)
Calcium: 7.7 mg/dL — ABNORMAL LOW (ref 8.9–10.3)
Chloride: 104 mmol/L (ref 98–111)
Creatinine, Ser: 0.39 mg/dL — ABNORMAL LOW (ref 0.44–1.00)
GFR, Estimated: >60 mL/min (ref >60)
Glucose, Bld: 191 mg/dL — ABNORMAL HIGH (ref 70–99)
Potassium: 3.1 mmol/L — ABNORMAL LOW (ref 3.5–5.1)
Sodium: 131 mmol/L — ABNORMAL LOW (ref 135–145)
Total Bilirubin: 0.6 mg/dL (ref 0.3–1.2)
Total Protein: 5.4 g/dL — ABNORMAL LOW (ref 6.5–8.1)

## 2021-10-01 LAB — GLUCOSE, CAPILLARY
Glucose-Capillary: 109 mg/dL — ABNORMAL HIGH (ref 70–99)
Glucose-Capillary: 110 mg/dL — ABNORMAL HIGH (ref 70–99)
Glucose-Capillary: 125 mg/dL — ABNORMAL HIGH (ref 70–99)
Glucose-Capillary: 127 mg/dL — ABNORMAL HIGH (ref 70–99)
Glucose-Capillary: 127 mg/dL — ABNORMAL HIGH (ref 70–99)
Glucose-Capillary: 135 mg/dL — ABNORMAL HIGH (ref 70–99)

## 2021-10-01 LAB — FERRITIN: Ferritin: 540 ng/mL — ABNORMAL HIGH (ref 11–307)

## 2021-10-01 LAB — CBC WITH DIFFERENTIAL/PLATELET
Abs Immature Granulocytes: 0.03 10*3/uL (ref 0.00–0.07)
Basophils Absolute: 0 10*3/uL (ref 0.0–0.1)
Basophils Relative: 0 %
Eosinophils Absolute: 0.1 10*3/uL (ref 0.0–0.5)
Eosinophils Relative: 1 %
HCT: 25.9 % — ABNORMAL LOW (ref 36.0–46.0)
Hemoglobin: 8.6 g/dL — ABNORMAL LOW (ref 12.0–15.0)
Immature Granulocytes: 0 %
Lymphocytes Relative: 14 %
Lymphs Abs: 1 10*3/uL (ref 0.7–4.0)
MCH: 26.5 pg (ref 26.0–34.0)
MCHC: 33.2 g/dL (ref 30.0–36.0)
MCV: 79.9 fL — ABNORMAL LOW (ref 80.0–100.0)
Monocytes Absolute: 0.4 10*3/uL (ref 0.1–1.0)
Monocytes Relative: 5 %
Neutro Abs: 6 10*3/uL (ref 1.7–7.7)
Neutrophils Relative %: 80 %
Platelets: 185 10*3/uL (ref 150–400)
RBC: 3.24 MIL/uL — ABNORMAL LOW (ref 3.87–5.11)
RDW: 22.7 % — ABNORMAL HIGH (ref 11.5–15.5)
WBC: 7.5 10*3/uL (ref 4.0–10.5)
nRBC: 0 % (ref 0.0–0.2)

## 2021-10-01 LAB — PHOSPHORUS: Phosphorus: 2.8 mg/dL (ref 2.5–4.6)

## 2021-10-01 LAB — IRON AND TIBC
Iron: 25 ug/dL — ABNORMAL LOW (ref 28–170)
Saturation Ratios: 23 % (ref 10.4–31.8)
TIBC: 110 ug/dL — ABNORMAL LOW (ref 250–450)
UIBC: 85 ug/dL

## 2021-10-01 LAB — RETICULOCYTES
Immature Retic Fract: 6 % (ref 2.3–15.9)
RBC.: 3.3 MIL/uL — ABNORMAL LOW (ref 3.87–5.11)
Retic Count, Absolute: 61.1 10*3/uL (ref 19.0–186.0)
Retic Ct Pct: 1.9 % (ref 0.4–3.1)

## 2021-10-01 LAB — FOLATE: Folate: 15.3 ng/mL (ref >5.9)

## 2021-10-01 LAB — MAGNESIUM: Magnesium: 1.9 mg/dL (ref 1.7–2.4)

## 2021-10-01 LAB — VITAMIN B12: Vitamin B-12: 1276 pg/mL — ABNORMAL HIGH (ref 180–914)

## 2021-10-01 MED ORDER — POTASSIUM CHLORIDE 10 MEQ/50ML IV SOLN
10.0000 meq | INTRAVENOUS | Status: AC
  Administered 2021-10-01 (×5): 10 meq via INTRAVENOUS
  Filled 2021-10-01 (×5): qty 50

## 2021-10-01 MED ORDER — ADULT MULTIVITAMIN W/MINERALS CH
1.0000 | ORAL_TABLET | Freq: Every day | ORAL | Status: DC
  Administered 2021-10-02 – 2021-10-08 (×7): 1 via ORAL
  Filled 2021-10-01 (×7): qty 1

## 2021-10-01 MED ORDER — CALCIUM CARBONATE ANTACID 500 MG PO CHEW
1.0000 | CHEWABLE_TABLET | Freq: Three times a day (TID) | ORAL | Status: DC
  Administered 2021-10-01 – 2021-10-12 (×26): 200 mg via ORAL
  Filled 2021-10-01 (×26): qty 1

## 2021-10-01 MED ORDER — POTASSIUM CHLORIDE 10 MEQ/100ML IV SOLN: 10.0000 meq | INTRAVENOUS | Status: DC

## 2021-10-01 MED ORDER — JUVEN PO PACK
1.0000 | PACK | Freq: Two times a day (BID) | ORAL | Status: DC
  Administered 2021-10-06 – 2021-10-09 (×4): 1 via ORAL
  Filled 2021-10-01 (×9): qty 1

## 2021-10-01 MED ORDER — SUCRALFATE 1 GM/10ML PO SUSP
1.0000 g | Freq: Three times a day (TID) | ORAL | Status: DC
  Administered 2021-10-01 – 2021-10-12 (×30): 1 g via ORAL
  Filled 2021-10-01 (×36): qty 10

## 2021-10-01 MED ORDER — POTASSIUM CHLORIDE 10 MEQ/50ML IV SOLN
10.0000 meq | INTRAVENOUS | Status: AC
  Administered 2021-10-01 (×2): 10 meq via INTRAVENOUS
  Filled 2021-10-01 (×2): qty 50

## 2021-10-01 MED ORDER — GUAIFENESIN 100 MG/5ML PO LIQD
5.0000 mL | Freq: Four times a day (QID) | ORAL | Status: DC
  Administered 2021-10-01 – 2021-10-09 (×24): 5 mL via ORAL
  Filled 2021-10-01 (×27): qty 10

## 2021-10-01 MED ORDER — TRAVASOL 10 % IV SOLN
INTRAVENOUS | Status: AC
  Filled 2021-10-01: qty 1144.8

## 2021-10-01 MED ORDER — ENSURE ENLIVE PO LIQD
237.0000 mL | Freq: Three times a day (TID) | ORAL | Status: DC
  Administered 2021-10-01 – 2021-10-12 (×18): 237 mL via ORAL

## 2021-10-01 NOTE — Progress Notes (Addendum)
PHARMACY - TOTAL PARENTERAL NUTRITION CONSULT NOTE  ? ?Indication:  intolerance to enteral feeding ? ?Patient Measurements: ?Height: '5\' 9"'$  (175.3 cm) ?Weight: 52 kg (114 lb 10.2 oz) ?IBW/kg (Calculated) : 66.2 ?TPN AdjBW (KG): 66.7 ?Body mass index is 16.93 kg/m?. ?Usual Weight:  ? ?Assessment: 73 yoF admitted on 08/31/21 with bloody stools.  PMH significant for homelessness, recurrent GIB, chronic microcytic anemia, hypertension, polysubstance abuse, Roux n Y gastric bypass.  She is s/p endoscopy showing large anastomotic ulceration and is being treated with PPI.  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  ?Dobhoff feeding tube placed, required removal d/t unable to advance to correct position.  Pharmacy is now consulted to dose TPN.   ? ?Glucose / Insulin: CBGs 85-191 ?Electrolytes: replacing ?Renal: SCr 0.46, BUN 31 ?Hepatic: LFTs, Tbili WNL, Albumin 2.5 ?Intake / Output; MIVF: net neg 9.5 L this admission ?GI Imaging: ?GI Surgeries / Procedures:  ? ?Central access: PICC ?TPN start date: 5/9  ? ?Nutritional Goals: ?Goal TPN rate is 90 mL/hr (provides 114 g of protein and 2281 kcals per day) ? ?RD Assessment: (5/9) ?Estimated Needs ?Total Energy Estimated Needs: 2150-2350 kcal ?Total Protein Estimated Needs: 100-115 grams ?Total Fluid Estimated Needs: >/= 2.2 L/day ? ?Current Nutrition:  ?TPN ?FLD ? ?Plan:  ?Order for potassium 10 mEq IV x 5 ?MD ordered an additional 10 mEq IV x 2 ?At 18:00, increase TPN to 90 ml/hr (goal) ?Electrolytes in TPN: Na 29mq/L, K 567m/L, Ca 54m50mL, Mg 54mE22m, and Phos 154mm354m. Cl:Ac 1:2 ?Add standard MVI and trace elements to TPN ?Continue Sensitive q6h SSI and adjust as needed  ?Discussed with Dr. WoodsSherral Hammersp MIVF at 1800 once TPN at goal ?Monitor TPN labs on Mon/Thurs, BMET/Mag/Phos daily x3 days (Wed-Friday) ?Monitor closely for re-feeding ?Remains on FLD - f/u advancement for appropriate wean of TPN as  indicated ? ? ?Zayana Salvador Tawnya CrookrmD, BCPS ?Clinical Pharmacist ?10/01/2021 11:40 AM ? ? ?

## 2021-10-01 NOTE — Progress Notes (Signed)
Physical Therapy Treatment ?Patient Details ?Name: Terri [name] ?MRN: [id number] ?DOB: 12/11/1975 ?Today's Date: 10/01/2021 ? ? ?History of Present Illness 46 yo female with pmh homelessness, recurrent gib, chronic microcytic anemia, htn, polysubstance abuse, roux en y presented to [facility] 08/31/21 with reports of dark colored stools. 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 ? ?  ?PT Comments  ? ?  The patient participated in green TB UE exercises x 10 reps. Patient declined to a work on LE exercises, declined to attempt sitting. Patient focussed on dialing phone, unable to hold phone and press the numbers, dropping phone  frequently.Therapist to called the number requested, Patient  spoke to someone and said that she was going to meet someone and and hung up. Patient tried to  call a number without success and asked therapist tio call the same number. Patient's sig. Other in room.  ?Recommendations for follow up therapy are one component of a multi-disciplinary discharge planning process, led by the attending physician.  Recommendations may be updated based on patient status, additional functional criteria and insurance authorization. ? ?Follow Up Recommendations ? Skilled nursing-short term rehab (<3 hours/day) ?  ?  ?Assistance Recommended at Discharge Frequent or constant Supervision/Assistance  ?Patient can return home with the following Two people to help with walking and/or transfers;Assistance with cooking/housework;Direct supervision/assist for medications management;Assist for transportation;Two people to help with bathing/dressing/bathroom;Assistance with feeding;Help with stairs or ramp for entrance ?  ?Equipment Recommendations ? None recommended by PT  ?  ?Recommendations for Other Services   ? ? ?  ?Precautions / Restrictions Precautions ?Precautions: Fall ?Precaution Comments: flexiseal, cachectic  ?  ? ?Mobility ? Bed Mobility ?  ?  ?  ?  ?  ?  ?   ?General bed mobility comments: pt. declined to be positioned  , declined to sit up ?  ? ?Transfers ?  ?  ?  ?  ?  ?  ?  ?  ?  ?  ?  ? ?Ambulation/Gait ?  ?  ?  ?  ?  ?  ?  ?  ? ? ?Stairs ?  ?  ?  ?  ?  ? ? ?Wheelchair Mobility ?  ? ?Modified Rankin (Stroke Patients Only) ?  ? ? ?  ?Balance   ?  ?  ?Sitting balance - Comments: leans  to right in bed ?  ?  ?  ?  ?  ?  ?  ?  ?  ?  ?  ?  ?  ?  ?  ?  ? ?  ?Cognition Arousal/Alertness: Awake/alert ?  ?  ?Area of Impairment: Orientation, Attention, Following commands, Awareness ?  ?  ?  ?  ?  ?  ?  ?  ?Orientation Level: Time, Situation, Place ?Current Attention Level: Focused ?  ?Following Commands: Follows one step commands inconsistently ?  ?  ?  ?General Comments: patient  perseverating on phone, trying to dial a number, asked therapist to dial number, spoke to some one and said" I am going to meet chris." Hung up and asked to ring againg. patient's  sig other in room, did not  assist patient. ?  ?  ? ?  ?Exercises   ? ?  ?General Comments   ?  ?  ? ?Pertinent Vitals/Pain Pain Assessment ?Faces Pain Scale: Hurts even more ?Pain Location: left foot when passisvely flexed to neutral  ? ? ?Home Living   ?  ?  ?  ?  ?  ?  ?  ?  ?  ?   ?  ?  Prior Function    ?  ?  ?   ? ?PT Goals (current goals can now be found in the care plan section) Progress towards PT goals: Progressing toward goals ? ?  ?Frequency ? ? ?   ? ? ? ?  ?PT Plan Current plan remains appropriate  ? ? ?Co-evaluation   ?  ?  ?  ?  ? ?  ?AM-PAC PT "6 Clicks" Mobility   ?Outcome Measure ? Help needed turning from your back to your side while in a flat bed without using bedrails?: Total ?Help needed moving from lying on your back to sitting on the side of a flat bed without using bedrails?: Total ?Help needed moving to and from a bed to a chair (including a wheelchair)?: Total ?Help needed standing up from a chair using your arms (e.g., wheelchair or bedside chair)?: Total ?Help needed to walk in hospital room?:  Total ?Help needed climbing 3-5 steps with a railing? : Total ?6 Click Score: 6 ? ?  ?End of Session Equipment Utilized During Treatment: Oxygen ?Activity Tolerance: Patient limited by fatigue ?Patient left: in bed;with call bell/phone within reach;with family/visitor present ?Nurse Communication: Mobility status;Need for lift equipment ?PT Visit Diagnosis: Muscle weakness (generalized) (M62.81);Difficulty in walking, not elsewhere classified (R26.2);Pain;Adult, failure to thrive (R62.7) ?  ? ? ?Time: 9201-0071 ?PT Time Calculation (min) (ACUTE ONLY): 15 min ? ?Charges:  $Therapeutic Exercise: 8-22 mins          ?          ? ?[name] PT ?Acute Rehabilitation Services ?Pager [phone] ?Office [phone] ? ? ? ?Margrett Kalb, Shella Maxim ?10/01/2021, 3:08 PM ? ?

## 2021-10-01 NOTE — Progress Notes (Addendum)
Nutrition Follow-up ? ?DOCUMENTATION CODES:  ? ?Severe malnutrition in context of chronic illness, Underweight ? ?INTERVENTION:  ?- will order tums TID. ? ?- will order 1 tablet multivitamin with minerals/day for patient receiving MVI via TPN with hx of roux-en-y gastric bypass.  ? ?- will order Ensure Plus High Protein TID, each supplement provides 350 kcal and 20 grams of protein. ? ?- will order 1 packet Juven BID, each packet provides 95 calories, 2.5 grams of protein (collagen), and 9.8 grams of carbohydrate (3 grams sugar); also contains 7 grams of L-arginine and L-glutamine, 300 mg vitamin C, 15 mg vitamin E, 1.2 mcg vitamin B-12, 9.5 mg zinc, 200 mg calcium, and 1.5 g  Calcium Beta-hydroxy-Beta-methylbutyrate to support wound healing. ? ?- check serum vitamin B12 for patient with hx of roux-en-y gastric bypass.  ? ?- diet advancement as medically feasible. ? ?- continue to monitor for plans concerning nutrition support and d/c planning. ? ? ? ?NUTRITION DIAGNOSIS:  ? ?Severe Malnutrition related to chronic illness as evidenced by severe fat depletion, severe muscle depletion. - revised, ongoing ? ?GOAL:  ? ?Patient will meet greater than or equal to 90% of their needs -met with TPN regimen ? ?MONITOR:  ? ?PO intake, Supplement acceptance, Diet advancement, Labs, Weight trends ? ?ASSESSMENT:  ? 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 This admission EGD showed esophageal plaques consistent with candidiasis, large cratered nonbleeding duodenal ulcer without bleeding.  CT angiogram on 08/31/21 showed multiple low-attenuation lesions throughout the liver, highly concerning for hepatic abscesses or metastasis.  Now s/p IR US guided bx and aspiration, revealing  MSSA abscess. ? ?Significant Events: ?4/10- admission ?4/13- EGD ?4/17- liver biopsy ?4/23- thoracentesis with 1.2 L removed ?4/27- initial RD assessment; L great toe amputation at first metatarsophalangeal joint; L forefoot I&D ?4/30- re-intubated; small bore NGT placed (side port does not appear to have been able to be passed beyond the GE junction; triple lumen PICC placed in R basilic  ?5/5- extubated; small bore NGT removed ?5/7- small bore NGT placed at bedside ?5/8- small bore NGT unable to be advanced beyond GE junction by floor staff and by IR staff; removed ?5/9- MBS; diet advanced from NPO to FLD, thin liquids; TPN initiation ? ? ?Patient laying in bed with Fritz Pickerel at bedside. Flow sheet documentation indicates she ate 5% of breakfast and lunch yesterday. She denies having anything PO today. She denies abdominal pain, cramping, nausea, or otherwise discomfort. ? ?Patient agreeable to Ensure and Juven and shares that she enjoys fruity-flavored things. RN confirmed with patient that she is willing to drink Ensure and that she likes strawberry flavor; RD provided patient with one at the end of visit.  ? ?Patient with hx of roux-en-y gastric bypass in 2012 and reports that she has not taken multivitamin or micronutrient supplements for a long time; when attempting to determine how long it was, patient unable to recall the last time she took them and again reports it has been a long time.  ? ?She denies any difficulty with swallowing foods, beverages, or pills. Discussed using oral nutrition supplements to take PO medications. ? ?After visit to patient's room, RN shares with RD that patient then refused Ensure and MVI when they were presented to her. ? ?Triple lumen PICC remains in place. She  is receiving custom TPN at goal rate of 90 ml/hr which is providing 2281 kcal and 114 grams protein. RN denies any issues with PICC today; RN yesterday alerted RD that she was unable to push a flush through PICC. ? ?Weight  yesterday consistent with admission weight; 115 lb.  ? ? ?Labs reviewed; CBGs: 110 and 109 mg/dl, Na: 131 mmol/l, K: 3.1 mmol/l, BUN: 24 mg/dl, creatinine: 0.39 mg/dl, Ca: 7.7 mg/dl. Mg and Phos WDL. Triglycerides on 5/10: 16 mg/dl (up from 12 on 5/9). ? ?Medications reviewed; 1000 units cholecalciferol/day, imodium PRN, 40 mg IV protonix BID, 10 mEq IV KCl x5 runs 5/10 and x7 runs 5/11, 1 g carafate TID, 100 mg IV thiamine/day started 5/7. ? ?IVF; D5 @ 35 ml/hr (143 kcal/24 hrs).  ? ? ? ?NUTRITION - FOCUSED PHYSICAL EXAM: ? ?Flowsheet Row Most Recent Value  ?Orbital Region Severe depletion  ?Upper Arm Region Severe depletion  ?Thoracic and Lumbar Region Unable to assess  ?Buccal Region Severe depletion  ?Temple Region Severe depletion  ?Clavicle Bone Region Severe depletion  ?Clavicle and Acromion Bone Region Severe depletion  ?Scapular Bone Region Severe depletion  ?Dorsal Hand No depletion  [edema]  ?Patellar Region Severe depletion  ?Anterior Thigh Region Severe depletion  ?Posterior Calf Region Severe depletion  ?Edema (RD Assessment) None  ?Hair Reviewed  ?Eyes Reviewed  ?Mouth Reviewed  ?Skin Reviewed  ?Nails Reviewed  ? ?  ? ? ?Diet Order:   ?Diet Order   ? ?       ?  Diet full liquid Room service appropriate? Yes; Fluid consistency: Thin  Diet effective now       ?  ? ?  ?  ? ?  ? ? ?EDUCATION NEEDS:  ? ?Education needs have been addressed ? ?Skin:  Skin Assessment: Skin Integrity Issues: ?Skin Integrity Issues:: DTI ?DTI: sacrum; L buttocks; vertebral column x2 ?Other: left forefoot abscess, left great toe osteomyelitis ? ?Last BM:  5/10 (type 7 x6, all medium amount) ? ?Height:  ? ?Ht Readings from Last 1 Encounters:  ?09/18/21 _0  (1.753 m)  ? ? ?Weight:  ? ?Wt Readings from Last 1 Encounters:  ?09/30/21 52 kg  ? ? ? ?BMI:  Body mass index is 16.93 kg/m?. ? ?Estimated Nutritional Needs:  ?Kcal:  2150-2350 kcal ?Protein:  100-115 grams ?Fluid:  >/= 2.2 L/day ? ? ? ? ? ?Jarome Matin, MS, RD,  LDN ?Registered Dietitian II ?Inpatient Clinical Nutrition ?RD pager # and on-call/weekend pager # available in [place]  ? ?

## 2021-10-01 NOTE — Progress Notes (Signed)
? ?                                                                                                                                                     ?                                                   ?Daily Progress Note  ? ?Patient Name: Terri [name]       Date: 10/01/2021 ?DOB: 04-27-1976  Age: 46 y.o. MRN#: [id number] ?Attending Physician: Allie Bossier, MD ?Primary Care Physician: Pcp, No ?Admit Date: 08/31/2021 ? ?Reason for Consultation/Follow-up: Establishing goals of care ? ?Subjective: ?I saw and examined [name] today. Her significant other Terri [name] is at bedside. Re discussed goals of care with patient, she wishes for continuation of full code, full scope care, knows that she has a long road ahead of her, wants her daughter Terri [name] to make decisions on her behalf if she is ever not able to make her own decisions.  ?  ? ?Length of Stay: 30 ? ?Current Medications: ?Scheduled Meds:  ? amLODipine  10 mg Oral Daily  ? calcium carbonate  1 tablet Oral TID  ? chlorhexidine  15 mL Mouth Rinse BID  ? Chlorhexidine Gluconate Cloth  6 each Topical Daily  ? cholecalciferol  1,000 Units Oral Daily  ? cloNIDine  0.1 mg Transdermal Weekly  ? feeding supplement  237 mL Oral TID BM  ? guaiFENesin  5 mL Per Tube Q6H  ? heparin injection (subcutaneous)  5,000 Units Subcutaneous Q8H  ? hydrALAZINE  50 mg Oral BID  ? levalbuterol  0.63 mg Nebulization TID  ? lidocaine  1 patch Transdermal Daily  ? lisinopril  10 mg Oral QHS  ? mouth rinse  15 mL Mouth Rinse q12n4p  ? multivitamin with minerals  1 tablet Oral Daily  ? nicotine  21 mg Transdermal Daily  ? nutrition supplement (JUVEN)  1 packet Oral BID BM  ? pantoprazole (PROTONIX) IV  40 mg Intravenous Q12H  ? sodium chloride flush  10-40 mL Intracatheter Q12H  ? sucralfate  1 g Per Tube TID WC & HS  ? thiamine injection  100 mg Intravenous Daily  ? ? ?Continuous Infusions: ? sodium chloride 10 mL/hr at 09/30/21 0800  ? sodium chloride Stopped (09/30/21 1352)  ?  ampicillin-sulbactam (UNASYN) IV Stopped (10/01/21 0354)  ? dextrose 35 mL/hr at 10/01/21 1254  ? nitroGLYCERIN Stopped (09/30/21 2341)  ? potassium chloride 50 mL/hr at 10/01/21 1254  ? potassium chloride    ? TPN ADULT (ION) 60 mL/hr at 10/01/21 0406  ? TPN ADULT (ION)    ? ? ?PRN Meds: ?  sodium chloride, acetaminophen (TYLENOL) oral liquid 160 mg/5 mL **OR** acetaminophen, dextrose, fentaNYL (SUBLIMAZE) injection, guaiFENesin-dextromethorphan, haloperidol lactate **AND** [DISCONTINUED] LORazepam, hydrALAZINE, HYDROcodone-acetaminophen, lip balm, loperamide HCl, naLOXone (NARCAN)  injection, ondansetron (ZOFRAN) IV, saline, sodium chloride flush ?    ? ?Vital Signs: BP (!) 135/92   Pulse 81   Temp (!) 96 ?F (35.6 ?C) (Axillary)   Resp (!) 34   Ht '5\' 9"'$  (1.753 m)   Wt 52 kg   LMP  (LMP Unknown)   SpO2 97%   BMI 16.93 kg/m?  ?SpO2: SpO2: 97 % ?O2 Device: O2 Device: Room Air ?O2 Flow Rate: O2 Flow Rate (L/min): 3 L/min ?Awake alert ?Frail and weak ?Slow to verbalize ?Shallow breath sounds ?S 1 S 2 ? ?Intake/output summary:  ?Intake/Output Summary (Last 24 hours) at 10/01/2021 1336 ?Last data filed at 10/01/2021 1254 ?Gross per 24 hour  ?Intake 2116.2 ml  ?Output 1600 ml  ?Net 516.2 ml  ? ?LBM: Last BM Date : 10/01/21 ?Baseline Weight: Weight: 53.2 kg ?Most recent weight: Weight: 52 kg ? ?     ?Palliative Assessment/Data: ? ? ? ? ? ?Patient Active Problem List  ? Diagnosis Date Noted  ? Protein-calorie malnutrition, severe 09/21/2021  ? Involuntary commitment 09/19/2021  ? Acute respiratory failure with hypoxia ([place]) 09/15/2021  ? Dysuria 09/15/2021  ? Vaginal discharge 09/14/2021  ? Exudative pleural effusion 09/12/2021  ? Hypophosphatemia 09/12/2021  ? Ascites 09/11/2021  ? Hepatic abscess   ? Acute back pain   ? Swelling of left hand   ? Disseminated MSSA with liver abscess, left foot osteo and abscess, 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   ? Candida infection, esophageal ([place]) 09/03/2021  ? Portal vein thrombosis  08/31/2021  ? Community acquired pneumonia 07/08/2021  ? Homelessness 07/08/2021  ? Multilobar pneumonia with acute hypoxic respiratory failure ([place])   ? Cellulitis of right lower extremity 05/28/2021  ? Vitamin B12 deficiency 05/24/2021  ? Cellulitis 05/22/2021  ? COVID-19 virus infection 05/22/2021  ? Overdose 04/13/2021  ? Hypoxemia 04/13/2021  ? Hypoglycemia 04/13/2021  ? Asthma exacerbation 04/01/2021  ? Acute on chronic blood loss anemia 04/01/2021  ? Iron deficiency anemia 03/18/2021  ? Hyponatremia 03/18/2021  ? Marginal ulcer 03/15/2021  ? Microcytic anemia 02/26/2021  ? Mild protein malnutrition ([place]) 02/26/2021  ? Polysubstance abuse ([place]) 01/16/2021  ? Abdominal pain 01/14/2021  ? 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 Cocaine abuse ([place]) 01/13/2021  ? Heroin abuse ([place]) 01/13/2021  ? Hypertensive urgency 01/13/2021  ? Tobacco abuse 01/13/2021  ? SIRS (systemic inflammatory response syndrome) ([place]) 01/13/2021  ? Acute pyelonephritis 02/11/2019  ? Sepsis due to Escherichia coli (E. coli) ([place]) 02/11/2019  ? AKI (acute kidney injury) ([place])   ? Peptic ulcer disease 02/09/2019  ? 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 Persistent fever 02/09/2019  ? COVID-19 virus not detected   ? Chest pain   ? Bacteremia due to Escherichia coli   ? Esophagitis   ? Hypokalemia   ? Hypomagnesemia   ? Sepsis ([place]) 02/02/2019  ? Leucocytosis 02/02/2019  ? Thrombocytosis 02/02/2019  ? Acute lower UTI 02/02/2019  ? Apnea 09/13/2017  ? Essential hypertension   ? Cellulitis and abscess of neck 08/12/2017  ? Cellulitis of submandibular region 08/10/2017  ? Odontogenic infection of jaw 08/10/2017  ? Cholecystitis 02/14/2017  ? Opioid abuse with opioid-induced mood disorder ([place]) 01/25/2017  ? Anxiety 02/11/2014  ? Depression 07/12/2012  ? ? ?  Palliative Care Assessment & Plan  ? ?Recommendations/Plan: ?Full code/full scope ?Patient is awake alert, very frail,  wishes to continue with current mode of care.  ?Recommend SNF rehab with palliative.  ?Continue current mode of care.  ?  ? ?Code Status: ? ?  ?Code Status Orders  ?(From admission, onward)  ?  ? ? ?  ? ?  Start     Ordered  ? 08/31/21 1940  Full code  Continuous       ? 08/31/21 1939  ? ?  ?  ? ?  ? ?Code Status History   ? ? Date Active Date Inactive Code Status Order ID Comments User Context  ? 07/08/2021 0023 07/08/2021 1821 Full Code [id number]  Lyndee Hensen, DO ED  ? 07/03/2021 2249 07/04/2021 2110 Full Code [id number]  Erskine Emery, MD ED  ? 06/25/2021 0747 06/25/2021 1544 Full Code [id number]  Lennice Sites, DO ED  ? 05/28/2021 1240 05/29/2021 1756 Full Code [id number]  Jonnie Finner, DO ED  ? 05/22/2021 2129 05/24/2021 2331 Full Code [id number]  Toy Baker, MD ED  ? 04/13/2021 0537 04/13/2021 1830 Full Code [id number]  Chotiner, Yevonne Aline, MD ED  ? 04/02/2021 0005 04/06/2021 2019 Full Code [id number]  Toy Baker, MD Inpatient  ? 03/15/2021 2340 03/19/2021 0025 Full Code [id number]  Marcelyn Bruins, MD ED  ? 02/26/2021 0740 03/02/2021 1429 Full Code [id number]  Reubin Milan, MD ED  ? 01/17/2021 0138 01/18/2021 1622 Full Code [id number]  Chotiner, Yevonne Aline, MD Inpatient  ? 01/14/2021 1739 01/16/2021 1246 Full Code [id number]  Geradine Girt, DO ED  ? 01/13/2021 1456 01/14/2021 1151 Full Code [id number]  Jacqlyn Krauss, MD ED  ? 07/16/2019 0003 07/16/2019 1423 Full Code [id number]  Delaine Lame ED  ? 02/02/2019 2333 02/11/2019 1754 Full Code [id number]  Elwyn Reach, MD Inpatient  ? 09/13/2017 0912 09/15/2017 2035 Full Code [id number]  Kayleen Memos, DO ED  ? 08/10/2017 2257 08/19/2017 1320 Full Code [id number]  Etta Quill, DO ED  ? 02/14/2017 1535 02/16/2017 1347 Full Code [id number]  Earnstine Regal, PA-C Inpatient  ? 01/24/2017 2033 01/26/2017 1501 Full Code [id number]  Blanchie Dessert, MD ED  ? 02/10/2014 2323 02/11/2014 1544 Full Code [id number]  Artis Delay, MD ED  ? 06/21/2012 2117  06/22/2012 0953 Full Code [id number]  Linus Mako, PA ED  ? ?  ? ? ?Total time: 25 minutes ? ?Loistine Chance, MD ? ?Please contact Palliative Medicine Team phone at [phone] for questions and concerns.  ? ? ? ? ? ?

## 2021-10-01 NOTE — Progress Notes (Signed)
?PROGRESS NOTE ? ? ? ?LEXA [name]  YDX:[id number] DOB: 12-31-1975 DOA: 08/31/2021 ?PCP: Pcp, No  ? ? ? ?Brief Narrative:  ?46 year old with past medical history significant for homelessness, recurrent GIB, chronic microcytic anemia, hypertension, polysubstance abuse, Roux n Y presented to [facility] long hospital complaining of dark color related stool.  Patient has history of chronic GI bleed secondary to peptic ulcers.  She underwent endoscopy with large anastomotic ulceration noted and is started on PPI.  Hemoglobin is stabilized. ?  ?She was subsequently found to have MSSA bacteremia that appears to be disseminated.  Her left great toe was noted to be gangrenous, vascular surgery and orthopedics determined amputation will be best route for patient and this was done on 4/26.  Culture from great toe grew with MRSA and patient was broadened to vancomycin.  She was found to have multiple liver abscess, and history of portal vein thrombosis. ?  ?During hospital course she started to require increased oxygen support, ultimately transferred to ICU and intubated. ? ? ?Subjective: ?5/11 afebrile overnight A/O x4.  Continued pain from multiple ulcers on her back and LEFT great toe amputation. ? ? ?Assessment & Plan: ?Covid vaccination; ?  ?Principal Problem: ?  Disseminated MSSA with liver abscess, left foot osteo and abscess, 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 ?Active Problems: ?  Multilobar pneumonia with acute hypoxic respiratory failure ([place]) ?  Hepatic abscess ?  Acute respiratory failure with hypoxia ([place]) ?  Acute on chronic blood loss anemia ?  Swelling of left hand ?  Portal vein thrombosis ?  Exudative pleural effusion ?  Involuntary commitment ?  Ascites ?  Vaginal discharge ?  Polysubstance abuse ([place]) ?  Hypophosphatemia ?  Candida infection, esophageal ([place]) ?  Opioid abuse with opioid-induced mood disorder ([place]) ?  Leucocytosis ?  Protein-calorie malnutrition, severe ? ?Acute respiratory failure with hypoxia and  hypercapnia/positive MRSA pneumonia ?-Intubated 09/20/2021  ?-5/5 Extubation ?-Treated  with Teflaro and IV Flagyl initially.  ?-Completed 10 days coverage MRSA PNA>  ?-Follow ID recommendation ? ?Polymicrobial liver abscess (MSSA hepatic abscess) ?-Now on Unasyn to cover Liver Abscess. Will also cover Bacteroids ?-She will need TEE when more stable, per ID.  ?-Per ID note 4/18 will need at least 6 weeks of antibiotics ? ?Bilateral pleural effusion, right>> left ?-4/23 s/p status post RIGHT thoracentesis; exudative based on pleural fluid study.  Culture negative. ?-5/7 CXR : Stable infiltrate in the lateral left lung base, small layering right effusion with underlying opacities stable to mildly more prominent.  There is new right perihilar opacity ? ?Volume overload, pulmonary edema acute ?-Received IV lasix and albumin 5/07 ?-Strict in and out -1.6 L ?- Daily weight ?Filed Weights  ? 09/28/21 0600 09/29/21 0403 09/30/21 0408  ?Weight: 61.8 kg 54.8 kg 52 kg  ?  ? ?Hypertensive urgency/Essential HTN ?-5/10 amlodipine 10 mg daily ?- 5/10 Catapres 0.1 mg transdermal ?-5/10 hydralazine 50 mg BID ?-5/10 hydralazine IV PRN ?-5/10 lisinopril 10 mg daily ?- 5/10 NTG drip goal SBP 110-130 ?-5/11 BP now at goal ?  ?Disseminated MSSA infection, osteo LEFT great toe  ?-S/p status post amputation ? ?Severe protein caloric malnutrition and failure to thrive ?-She failed a swallow evaluation.  She will need core track for tube feedings ?-Started  IV thiamine ?-Core track unable to be advance by IR> will ask GI to see.  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  He will not be possible to place  a tube in her stomach due to limited space.  She also has a large anastomotic ulcer which complicate things.  We can consider surgery consult for a J-tube placement due to her anatomy. ?-5/8 started TPN .  She is probably very weak to undergoing surgical procedure for J tube placement. She passed  swallow evaluation, started on full liquid diet. Hopefully we can discontinue TPN soon.  ?-5/11-calorie count pending ?  ?Left groin possible abscess versus mass; ?-5 cm mass left groin and Doppler X lower extremity 09/20/2021. ?-Not seen on nonvascular ultrasound of left groin 5/1Will need follow-up ?  ?Acute encephalopathy  ?-Multifactorial sepsis, Chronic narcotic dependence. ?-Has been seen by psych and deemed not to have capacity to make medical decisions ?-Resolved ?  ?History of portal vein thrombosis:  ?-No candidate for anticoagulation.  ?  ?Acute GI bleed //Acute blood loss anemia;  ?admitted with a hemoglobin of 4.6 superimposed on chronic microcytic anemia ?-Endoscopy: Large anastomotic Ulceration.  ?On IV PPI.  ?Lab Results  ?Component Value Date  ? HGB 8.6 (L) 10/01/2021  ? HGB 9.6 (L) 09/30/2021  ? HGB 7.8 (L) 09/29/2021  ? HGB 9.4 (L) 09/28/2021  ? HGB 9.1 (L) 09/27/2021  ?-Stable ?  ?Anemia ?-Anemia panel pending ? ?Thrombocytopenia:  ?Likely related to infection ?Resolved ?  ?Hypokalemia: ?-Potassium goal> 4 ?- 5/11 potassium IV 70 mEq ? ?Hypocalcemia ?- Corrected calcium= 9.14 no need for repletion at this time continue to monitor. ?  ?Hypoglycemia:  ?-5/11 TPN at goal, DC D10 ?  ?Homelessness ? ? ?Polysubstance abuse ?-4/10 positive cocaine ?Psych was consulted and recommended IVC, may need to revisit capacity again prior to discharge if cooperation becomes an issue. ?  ?Vitamin D deficiency ?-On vitamin D supplements ? ?Severe protein calorie malnutrition ?-Continue TPN ?-Cleared by speech for full liquid diet ? ? ?Pressure Injury 09/24/21 Sacrum Medial Deep Tissue Pressure Injury - Purple or maroon localized area of discolored intact skin or blood-filled blister due to damage of underlying soft tissue from pressure and/or shear. several deep tissue injuries, dark  (Active)  ?09/24/21 1100  ?Location: Sacrum  ?Location Orientation: Medial  ?Staging: Deep Tissue Pressure Injury - Purple or maroon  localized area of discolored intact skin or blood-filled blister due to damage of underlying soft tissue from pressure and/or shear.  ?Wound Description (Comments): several deep tissue injuries, dark purple, non-blanchable  ?Present on Admission: No  ?Dressing Type Foam - Lift dressing to assess site every shift 09/30/21 2000  ?   ?Pressure Injury 09/24/21 Buttocks Left;Posterior;Proximal Deep Tissue Pressure Injury - Purple or maroon localized area of discolored intact skin or blood-filled blister due to damage of underlying soft tissue from pressure and/or shear. several purple,  (Active)  ?09/24/21 1100  ?Location: Buttocks (gluteal fold)  ?Location Orientation: Left;Posterior;Proximal  ?Staging: Deep Tissue Pressure Injury - Purple or maroon localized area of discolored intact skin or blood-filled blister due to damage of underlying soft tissue from pressure and/or shear.  ?Wound Description (Comments): several purple, non-blanchable  ?Present on Admission: No  ?Dressing Type Foam - Lift dressing to assess site every shift 09/30/21 2000  ?   ?Pressure Injury 09/24/21 Vertebral column Lower Deep Tissue Pressure Injury - Purple or maroon localized area of discolored intact skin or blood-filled blister due to damage of underlying soft tissue from pressure and/or shear. spinal column deep tissue, (Active)  ?09/24/21 1100  ?Location: Vertebral column  ?Location Orientation: Lower  ?Staging: Deep Tissue Pressure Injury - Purple or maroon  localized area of discolored intact skin or blood-filled blister due to damage of underlying soft tissue from pressure and/or shear.  ?Wound Description (Comments): spinal column deep tissue, purple, non-blanchable  ?Present on Admission: No  ?Dressing Type Foam - Lift dressing to assess site every shift 09/30/21 2000  ?   ?Pressure Injury 09/24/21 Vertebral column Right;Upper Deep Tissue Pressure Injury - Purple or maroon localized area of discolored intact skin or blood-filled  blister due to damage of underlying soft tissue from pressure and/or shear. purple, non-blanchab (Active)  ?09/24/21 1100  ?Location: Vertebral column  ?Location Orientation: Right;Upper  ?Staging: Deep Tissue Pr

## 2021-10-01 NOTE — Care Plan (Signed)
Spoke with jonathan RN, RN unable to accompany patient to MRI for exams this morning, pt cannot come off tele. Will attempt to get patient later today when schedule permits 3:33O ?

## 2021-10-02 ENCOUNTER — Inpatient Hospital Stay (HOSPITAL_COMMUNITY): Payer: Medicaid Other

## 2021-10-02 DIAGNOSIS — K750 Abscess of liver: Secondary | ICD-10-CM | POA: Diagnosis not present

## 2021-10-02 DIAGNOSIS — J181 Lobar pneumonia, unspecified organism: Secondary | ICD-10-CM | POA: Diagnosis not present

## 2021-10-02 DIAGNOSIS — K922 Gastrointestinal hemorrhage, unspecified: Secondary | ICD-10-CM | POA: Diagnosis not present

## 2021-10-02 DIAGNOSIS — B3781 Candidal esophagitis: Secondary | ICD-10-CM | POA: Diagnosis not present

## 2021-10-02 DIAGNOSIS — J9601 Acute respiratory failure with hypoxia: Secondary | ICD-10-CM | POA: Diagnosis not present

## 2021-10-02 DIAGNOSIS — Z7189 Other specified counseling: Secondary | ICD-10-CM

## 2021-10-02 LAB — PHOSPHORUS: Phosphorus: 2.4 mg/dL — ABNORMAL LOW (ref 2.5–4.6)

## 2021-10-02 LAB — COMPREHENSIVE METABOLIC PANEL
ALT: 17 U/L (ref 0–44)
AST: 29 U/L (ref 15–41)
Albumin: 2.3 g/dL — ABNORMAL LOW (ref 3.5–5.0)
Alkaline Phosphatase: 122 U/L (ref 38–126)
Anion gap: 5 (ref 5–15)
BUN: 22 mg/dL — ABNORMAL HIGH (ref 6–20)
CO2: 23 mmol/L (ref 22–32)
Calcium: 7.9 mg/dL — ABNORMAL LOW (ref 8.9–10.3)
Chloride: 104 mmol/L (ref 98–111)
Creatinine, Ser: 0.33 mg/dL — ABNORMAL LOW (ref 0.44–1.00)
GFR, Estimated: >60 mL/min (ref >60)
Glucose, Bld: 94 mg/dL (ref 70–99)
Potassium: 3.8 mmol/L (ref 3.5–5.1)
Sodium: 132 mmol/L — ABNORMAL LOW (ref 135–145)
Total Bilirubin: 0.5 mg/dL (ref 0.3–1.2)
Total Protein: 5.7 g/dL — ABNORMAL LOW (ref 6.5–8.1)

## 2021-10-02 LAB — CBC WITH DIFFERENTIAL/PLATELET
Abs Immature Granulocytes: 0.02 10*3/uL (ref 0.00–0.07)
Basophils Absolute: 0 10*3/uL (ref 0.0–0.1)
Basophils Relative: 0 %
Eosinophils Absolute: 0.1 10*3/uL (ref 0.0–0.5)
Eosinophils Relative: 2 %
HCT: 24.7 % — ABNORMAL LOW (ref 36.0–46.0)
Hemoglobin: 8.2 g/dL — ABNORMAL LOW (ref 12.0–15.0)
Immature Granulocytes: 0 %
Lymphocytes Relative: 12 %
Lymphs Abs: 0.6 10*3/uL — ABNORMAL LOW (ref 0.7–4.0)
MCH: 26.6 pg (ref 26.0–34.0)
MCHC: 33.2 g/dL (ref 30.0–36.0)
MCV: 80.2 fL (ref 80.0–100.0)
Monocytes Absolute: 0.3 10*3/uL (ref 0.1–1.0)
Monocytes Relative: 6 %
Neutro Abs: 4.2 10*3/uL (ref 1.7–7.7)
Neutrophils Relative %: 80 %
Platelets: 120 10*3/uL — ABNORMAL LOW (ref 150–400)
RBC: 3.08 MIL/uL — ABNORMAL LOW (ref 3.87–5.11)
RDW: 22 % — ABNORMAL HIGH (ref 11.5–15.5)
WBC: 5.2 10*3/uL (ref 4.0–10.5)
nRBC: 0 % (ref 0.0–0.2)

## 2021-10-02 LAB — GLUCOSE, CAPILLARY
Glucose-Capillary: 110 mg/dL — ABNORMAL HIGH (ref 70–99)
Glucose-Capillary: 106 mg/dL — ABNORMAL HIGH (ref 70–99)
Glucose-Capillary: 94 mg/dL (ref 70–99)
Glucose-Capillary: 113 mg/dL — ABNORMAL HIGH (ref 70–99)
Glucose-Capillary: 110 mg/dL — ABNORMAL HIGH (ref 70–99)
Glucose-Capillary: 112 mg/dL — ABNORMAL HIGH (ref 70–99)

## 2021-10-02 LAB — VITAMIN B12: Vitamin B-12: 1191 pg/mL — ABNORMAL HIGH (ref 180–914)

## 2021-10-02 LAB — MAGNESIUM: Magnesium: 1.8 mg/dL (ref 1.7–2.4)

## 2021-10-02 MED ORDER — POTASSIUM PHOSPHATES 15 MMOLE/5ML IV SOLN
10.0000 mmol | Freq: Once | INTRAVENOUS | Status: AC
  Administered 2021-10-02: 10 mmol via INTRAVENOUS
  Filled 2021-10-02: qty 3.33

## 2021-10-02 MED ORDER — PANTOPRAZOLE SODIUM 40 MG PO TBEC
40.0000 mg | DELAYED_RELEASE_TABLET | Freq: Two times a day (BID) | ORAL | Status: DC
Start: 2021-10-02 — End: 2021-10-12
  Administered 2021-10-02 – 2021-10-12 (×21): 40 mg via ORAL
  Filled 2021-10-02 (×21): qty 1

## 2021-10-02 MED ORDER — MEGESTROL ACETATE 400 MG/10ML PO SUSP
400.0000 mg | Freq: Two times a day (BID) | ORAL | Status: DC
  Administered 2021-10-02: 400 mg via ORAL
  Filled 2021-10-02 (×2): qty 10

## 2021-10-02 MED ORDER — TRAVASOL 10 % IV SOLN
INTRAVENOUS | Status: AC
  Filled 2021-10-02: qty 1144.8

## 2021-10-02 MED ORDER — THIAMINE HCL 100 MG PO TABS
100.0000 mg | ORAL_TABLET | Freq: Every day | ORAL | Status: DC
  Administered 2021-10-02 – 2021-10-12 (×11): 100 mg via ORAL
  Filled 2021-10-02 (×11): qty 1

## 2021-10-02 MED ORDER — MEGESTROL ACETATE 40 MG PO TABS: 320.0000 mg | ORAL_TABLET | Freq: Two times a day (BID) | ORAL | Status: DC

## 2021-10-02 MED ORDER — GADOBUTROL 1 MMOL/ML IV SOLN
5.0000 mL | Freq: Once | INTRAVENOUS | Status: AC | PRN
  Administered 2021-10-02: 5 mL via INTRAVENOUS

## 2021-10-02 MED ORDER — MAGNESIUM SULFATE IN D5W 1-5 GM/100ML-% IV SOLN
1.0000 g | Freq: Once | INTRAVENOUS | Status: AC
  Administered 2021-10-02: 1 g via INTRAVENOUS
  Filled 2021-10-02: qty 100

## 2021-10-02 MED ORDER — FOLIC ACID 1 MG PO TABS
1.0000 mg | ORAL_TABLET | Freq: Every day | ORAL | Status: DC
  Administered 2021-10-02 – 2021-10-12 (×11): 1 mg via ORAL
  Filled 2021-10-02 (×11): qty 1

## 2021-10-02 NOTE — Progress Notes (Signed)
Speech Language Pathology Treatment: Dysphagia  ?Patient Details ?Name: Terri [name] ?MRN: [id number] ?DOB: 1976/01/18 ?Today's Date: 10/02/2021 ?Time: 2353-6144 ?SLP Time Calculation (min) (ACUTE ONLY): 15 min ? ?Assessment / Plan / Recommendation ?Clinical Impression ? Pt seen for skilled SLP to address dysphagia goals including increasing po intake with tolerance. Pt wililng to consume solids with SLP and advised she would eat more if she was given solids  SLP brought pt clementine and bran muffin. Pt able to self feed *with assist* bran muffin x2 bites, 2 bites of potato soup, soda via straw.  Prolonged mastication across all solid consistencies observed and pt had to expectorate skin from clementine.  Observed increased work of breathing including accessory muscle use with po consumption. Delayed cough x1 observed after liquid swallow- therefore recommend strict precautions.  At this time, recommend advance to dys3/thin diet with full supervision and strict compliance to precautions.  Using teach back, pt educated to precautions with moderate verval cues but will need full supervision for safety.  Will follow for dysphagia management ? ?  ?HPI HPI: Gastroenterology was consulted, EGD on 09/03/2021 showed esophageal plaques consistent with candidiasis, large cratered nonbleeding duodenal ulcer with no stigmata of bleeding.  bx 4/17 showed acute inflammation with necrosis compatible with abscess, granulomatous inflammation with eosinophilia and fragment of foreign material, negative for malignancy;  CXR Grossly stable right upper lobe pneumonia. Stable mild left basilar atelectasis or infiltrate., incr ox needs thus TEE deferred, 4/23  thora and s/p paracentesis 4/17; ? Endocarditis; cocaine +. Bedside swallow evaluation completed by SLP on 09/15/21 and patient's swallow at that time appeared largely [facility] and no f/u recommended. Patient had to be intubated on 4/30 secondary to tachypneic with accessory muscle use and  increased WOB. She was extubated on 09/25/21 and has been NPO awaiting SLP swallow evaluation. ?  ?   ?SLP Plan ? Continue with current plan of care ? ?  ?  ?Recommendations for follow up therapy are one component of a multi-disciplinary discharge planning process, led by the attending physician.  Recommendations may be updated based on patient status, additional functional criteria and insurance authorization. ?  ? ?Recommendations  ?Diet recommendations: Dysphagia 3 (mechanical soft);Thin liquid ?Liquids provided via: Straw ?Medication Administration: Whole meds with puree ?Supervision: Patient able to self feed;Staff to assist with self feeding ?Compensations: Slow rate;Small sips/bites ?Postural Changes and/or Swallow Maneuvers: Seated upright 90 degrees;Upright 30-60 min after meal  ?   ?    ?   ? ? ? ? Oral Care Recommendations: Oral care BID ?Follow Up Recommendations: Skilled nursing-short term rehab (<3 hours/day) ?Assistance recommended at discharge: Frequent or constant Supervision/Assistance ?SLP Visit Diagnosis: Dysphagia, pharyngoesophageal phase (R13.14) ?Plan: Continue with current plan of care ? ? ? ? ?  ?  ? ? ?Macario Golds ? ?10/02/2021, 1:04 PM ?

## 2021-10-02 NOTE — Progress Notes (Signed)
?PROGRESS NOTE ? ? ? ?Terri [name]  SAY:[id number] DOB: Sep 18, 1975 DOA: 08/31/2021 ?PCP: Pcp, No  ? ? ? ?Brief Narrative:  ?46 year old with past medical history significant for homelessness, recurrent GIB, chronic microcytic anemia, hypertension, polysubstance abuse, Roux n Y presented to [facility] long hospital complaining of dark color related stool.  Patient has history of chronic GI bleed secondary to peptic ulcers.  She underwent endoscopy with large anastomotic ulceration noted and is started on PPI.  Hemoglobin is stabilized. ?  ?She was subsequently found to have MSSA bacteremia that appears to be disseminated.  Her left great toe was noted to be gangrenous, vascular surgery and orthopedics determined amputation will be best route for patient and this was done on 4/26.  Culture from great toe grew with MRSA and patient was broadened to vancomycin.  She was found to have multiple liver abscess, and history of portal vein thrombosis. ?  ?During hospital course she started to require increased oxygen support, ultimately transferred to ICU and intubated. ? ? ?Subjective: ?5/12 A/O x4, Continued pain from multiple ulcers on her back and LEFT great toe amputation.  Patient with anorexia (just does not feel hungry) ? ? ?Assessment & Plan: ?Covid vaccination; ?  ?Principal Problem: ?  Disseminated MSSA with liver abscess, left foot osteo and abscess, 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 ?Active Problems: ?  Multilobar pneumonia with acute hypoxic respiratory failure ([place]) ?  Hepatic abscess ?  Acute respiratory failure with hypoxia ([place]) ?  Acute on chronic blood loss anemia ?  Swelling of left hand ?  Portal vein thrombosis ?  Exudative pleural effusion ?  Involuntary commitment ?  Ascites ?  Vaginal discharge ?  Polysubstance abuse ([place]) ?  Hypophosphatemia ?  Candida infection, esophageal ([place]) ?  Opioid abuse with opioid-induced mood disorder ([place]) ?  Leucocytosis ?  Protein-calorie malnutrition, severe ? ?Acute respiratory  failure with hypoxia and hypercapnia/positive MRSA pneumonia ?-Intubated 09/20/2021  ?-5/5 Extubation ?-Treated  with Teflaro and IV Flagyl initially.  ?-Completed 10 days coverage MRSA PNA>  ?-Follow ID recommendation ? ?Polymicrobial liver abscess (MSSA hepatic abscess) ?-Now on Unasyn to cover Liver Abscess. Will also cover Bacteroids ?-She will need TEE when more stable, per ID.  ?-Per ID note 4/18 will need at least 6 weeks of antibiotics ? ?Disseminated MSSA infection, osteo LEFT great toe  ?-S/p status post amputation ? ?Left groin possible abscess versus mass; ?-5 cm mass left groin and Doppler X lower extremity 09/20/2021. ?-Not seen on nonvascular ultrasound of left groin 5/1Will need follow-up ? ?Osteomyelitis of thoracic/lumbar spine? ?- 5/12 MRI thoracic/lumbar spine pending ? ?Bilateral pleural effusion, right>> left ?-4/23 s/p status post RIGHT thoracentesis; exudative based on pleural fluid study.  Culture negative. ?-5/7 CXR : Stable infiltrate in the lateral left lung base, small layering right effusion with underlying opacities stable to mildly more prominent.  There is new right perihilar opacity ? ?Volume overload, pulmonary edema acute ?-Received IV lasix and albumin 5/07 ?-Strict in and out -1.3 L ?- Daily weight ?Filed Weights  ? 09/28/21 0600 09/29/21 0403 09/30/21 0408  ?Weight: 61.8 kg 54.8 kg 52 kg  ?  ? ?Hypertensive urgency/Essential HTN ?-5/10 amlodipine 10 mg daily ?- 5/10 Catapres 0.1 mg transdermal ?-5/10 hydralazine 50 mg BID ?-5/10 hydralazine IV PRN ?-5/10 lisinopril 10 mg daily ?- 5/10 NTG drip goal SBP 110-130 ?-5/11 BP now at goal ?  ?  ?Acute encephalopathy  ?-Multifactorial sepsis, Chronic narcotic dependence. ?-Has been seen by psych and deemed not  to have capacity to make medical decisions ?-Resolved ?  ?History of portal vein thrombosis:  ?-No candidate for anticoagulation.  ?  ?Acute GI bleed //Acute blood loss anemia;  ?admitted with a hemoglobin of 4.6 superimposed on  chronic microcytic anemia ?-Endoscopy: Large anastomotic Ulceration.  ?On IV PPI.  ?Lab Results  ?Component Value Date  ? HGB 8.2 (L) 10/02/2021  ? HGB 8.6 (L) 10/01/2021  ? HGB 9.6 (L) 09/30/2021  ? HGB 7.8 (L) 09/29/2021  ? HGB 9.4 (L) 09/28/2021  ?-Stable ?  ?Anemia ?-Anemia panel pending ? ?Thrombocytopenia:  ?Likely related to infection ?Resolved ?  ?Hypokalemia: ?-Potassium goal> 4 ?- 5/11 potassium IV 70 mEq ? ?Hypocalcemia ?- Corrected calcium= 9.14 no need for repletion at this time continue to monitor. ?  ?Hypoglycemia:  ?-5/11 TPN at goal, DC D10 ?  ?Homelessness ?-Will need to be addressed by LCSW ? ?Polysubstance abuse ?-4/10 positive cocaine ?Psych was consulted and recommended IVC, may need to revisit capacity again prior to discharge if cooperation becomes an issue. ?  ?Vitamin D deficiency ?-On vitamin D supplements ? ?Severe protein caloric malnutrition and failure to thrive ?-She failed a swallow evaluation.  She will need core track for tube feedings ?-Started  IV thiamine ?-Core track unable to be advance by IR> will ask GI to see.  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  He will not be possible to place a tube in her stomach due to limited space.  She also has a large anastomotic ulcer which complicate things.  We can consider surgery consult for a J-tube placement due to her anatomy. ?-5/8 started TPN .  She is probably very weak to undergoing surgical procedure for J tube placement. She passed swallow evaluation, started on full liquid diet. Hopefully we can discontinue TPN soon.  ?-5/11-calorie count pending ? ?Anorexia ?-5/12 Megace 320 mg BID ? ? ?Pressure Injury 09/24/21 Sacrum Medial Deep Tissue Pressure Injury - Purple or maroon localized area of discolored intact skin or blood-filled blister due to damage of underlying soft tissue from pressure and/or shear. several deep tissue injuries, dark  (Active)  ?09/24/21 1100  ?Location: Sacrum   ?Location Orientation: Medial  ?Staging: Deep Tissue Pressure Injury - Purple or maroon localized area of discolored intact skin or blood-filled blister due to damage of underlying soft tissue from pressure and/or shear.  ?Wound Description (Comments): several deep tissue injuries, dark purple, non-blanchable  ?Present on Admission: No  ?Dressing Type Foam - Lift dressing to assess site every shift 10/01/21 2000  ?   ?Pressure Injury 09/24/21 Buttocks Left;Posterior;Proximal Deep Tissue Pressure Injury - Purple or maroon localized area of discolored intact skin or blood-filled blister due to damage of underlying soft tissue from pressure and/or shear. several purple,  (Active)  ?09/24/21 1100  ?Location: Buttocks (gluteal fold)  ?Location Orientation: Left;Posterior;Proximal  ?Staging: Deep Tissue Pressure Injury - Purple or maroon localized area of discolored intact skin or blood-filled blister due to damage of underlying soft tissue from pressure and/or shear.  ?Wound Description (Comments): several purple, non-blanchable  ?Present on Admission: No  ?Dressing Type Foam - Lift dressing to assess site every shift 10/01/21 2000  ?   ?Pressure Injury 09/24/21 Vertebral column Lower Deep Tissue Pressure Injury - Purple or maroon localized area of discolored intact skin or blood-filled blister due to damage of underlying soft tissue from pressure and/or shear. spinal column deep tissue, (Active)  ?09/24/21 1100  ?Location:  Vertebral column  ?Location Orientation: Lower  ?Staging: Deep Tissue Pressure Injury - Purple or maroon localized area of discolored intact skin or blood-filled blister due to damage of underlying soft tissue from pressure and/or shear.  ?Wound Description (Comments): spinal column deep tissue, purple, non-blanchable  ?Present on Admission: No  ?Dressing Type Foam - Lift dressing to assess site every shift 10/01/21 2000  ?   ?Pressure Injury 09/24/21 Vertebral column Right;Upper Deep Tissue Pressure  Injury - Purple or maroon localized area of discolored intact skin or blood-filled blister due to damage of underlying soft tissue from pressure and/or shear. purple, non-blanchab (Active)  ?09/24/21 11

## 2021-10-02 NOTE — Progress Notes (Signed)
Physical Therapy Treatment ?Patient Details ?Name: Terri [name] ?MRN: [id number] ?DOB: Oct 05, 1975 ?Today's Date: 10/02/2021 ? ? ?History of Present Illness 46 yo female with pmh homelessness, recurrent gib, chronic microcytic anemia, htn, polysubstance abuse, roux en y presented to [facility] 08/31/21 with reports of dark colored stools. 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 ? ?  ?PT Comments  ? ? Patient  more alert today  and able to participate in mobility, albeit that patient requires total assistance of 2  persons. Patient was agreeable.  ?Assisted  opatient in sitting on bed edge, patient does not demonstrate trunk control. This was possibly the first time that she sat up  since admit. Continue PT for  exercises and mobility and OOB to recliner as tolerated. Will require  mechanical lift due to  profound weakness.   ?Recommendations for follow up therapy are one component of a multi-disciplinary discharge planning process, led by the attending physician.  Recommendations may be updated based on patient status, additional functional criteria and insurance authorization. ? ?Follow Up Recommendations ? Skilled nursing-short term rehab (<3 hours/day) ?  ?  ?Assistance Recommended at Discharge    ?Patient can return home with the following Two people to help with walking and/or transfers;Assistance with cooking/housework;Direct supervision/assist for medications management;Assist for transportation;Two people to help with bathing/dressing/bathroom;Assistance with feeding;Help with stairs or ramp for entrance ?  ?Equipment Recommendations ? None recommended by PT  ?  ?Recommendations for Other Services   ? ? ?  ?Precautions / Restrictions Precautions ?Precautions: Fall ?Precaution Comments: flexiseal, cachectic  ?  ? ?Mobility ? Bed Mobility ?Overal bed mobility: Needs Assistance ?Bed Mobility: Supine to Sit, Sit to Supine ?Rolling: Total assist, +2 for physical  assistance, +2 for safety/equipment ?  ?Supine to sit: Total assist, +2 for physical assistance, +2 for safety/equipment ?  ?  ?General bed mobility comments: tactile cues to  initiate moving the legs to bed edge. patient required max support. Bed pad used to slide patient around and lift trunk into sitting. total of  2 to return to supine. ?  ? ?Transfers ?  ?  ?  ?  ?  ?  ?  ?  ?  ?  ?  ? ?Ambulation/Gait ?  ?  ?  ?  ?  ?  ?  ?  ? ? ?Stairs ?  ?  ?  ?  ?  ? ? ?Wheelchair Mobility ?  ? ?Modified Rankin (Stroke Patients Only) ?  ? ? ?  ?Balance Overall balance assessment: Needs assistance ?Sitting-balance support: Feet supported, Bilateral upper extremity supported ?Sitting balance-Leahy Scale: Zero ?Sitting balance - Comments: patient required constant support of the  trunkd  for balance. ?  ?  ?  ?  ?  ?  ?  ?  ?  ?  ?  ?  ?  ?  ?  ?  ? ?  ?Cognition Arousal/Alertness: Awake/alert ?Behavior During Therapy: Flat affect ?  ?  ?  ?  ?  ?  ?  ?  ?  ?  ?Orientation Level: Time, Situation ?Current Attention Level: Focused ?  ?Following Commands: Follows one step commands inconsistently ?  ?  ?  ?General Comments: patient more alert, following simple directions,  said thank you after working with her. Family not in room. ?  ?  ? ?  ?Exercises   ? ?  ?General Comments   ?  ?  ? ?  Pertinent Vitals/Pain Pain Assessment ?Faces Pain Scale: Hurts even more ?Pain Location: left foot when passisvely flexed to neutral ?Pain Descriptors / Indicators: Discomfort, Sore ?Pain Intervention(s): Monitored during session  ? ? ?Home Living   ?  ?  ?  ?  ?  ?  ?  ?  ?  ?   ?  ?Prior Function    ?  ?  ?   ? ?PT Goals (current goals can now be found in the care plan section) Progress towards PT goals: Progressing toward goals ? ?  ?Frequency ? ? ? Min 2X/week ? ? ? ?  ?PT Plan Current plan remains appropriate  ? ? ?Co-evaluation   ?  ?  ?  ?  ? ?  ?AM-PAC PT "6 Clicks" Mobility   ?Outcome Measure ? Help needed turning from your back to your  side while in a flat bed without using bedrails?: Total ?Help needed moving from lying on your back to sitting on the side of a flat bed without using bedrails?: Total ?Help needed moving to and from a bed to a chair (including a wheelchair)?: Total ?Help needed standing up from a chair using your arms (e.g., wheelchair or bedside chair)?: Total ?Help needed to walk in hospital room?: Total ?Help needed climbing 3-5 steps with a railing? : Total ?6 Click Score: 6 ? ?  ?End of Session Equipment Utilized During Treatment: Oxygen ?Activity Tolerance: Patient limited by fatigue ?Patient left: in bed;with call bell/phone within reach;with bed alarm set ?Nurse Communication: Mobility status;Need for lift equipment ?PT Visit Diagnosis: Muscle weakness (generalized) (M62.81);Difficulty in walking, not elsewhere classified (R26.2);Pain;Adult, failure to thrive (R62.7) ?  ? ? ?Time: 5277-8242 ?PT Time Calculation (min) (ACUTE ONLY): 20 min ? ?Charges:  $Therapeutic Activity: 8-22 mins          ?          ? ?[name] PT ?Acute Rehabilitation Services ?Pager [phone] ?Office [phone] ? ? ? ?Karrah Mangini, Shella Maxim ?10/02/2021, 12:48 PM ? ?

## 2021-10-02 NOTE — Progress Notes (Signed)
Nutrition Note ? ?48 hour calorie count ordered yesterday at 1750.  ? ?Please document percentage of meals consumed (with specific items recorded) and supplements in flowsheets for next 48 hours. ? ?Results will be available 5/15. ? ? ? ?Clayton Bibles, MS, RD, LDN ?Inpatient Clinical Dietitian ?Contact information available via Amion ?  ?

## 2021-10-02 NOTE — Progress Notes (Signed)
PMT no charge note.  ? ?Reviewed patient's current condition with [facility] MD colleague Dr Sherral Hammers. Patient currently off the floor, likely for MRI.  ? ?PMT to follow over the weekend. No new PMT specific recommendations at this time. Reviewed my colleague Dr Kirstie Mirza notes as well and also discussed with Dr Candie Chroman (psychiatry resident) who is currently on the palliative service. Patient and family have repeatedly endorsed wishes for continuation of full code and full scope of care and were even considering placement of tracheostomy, however, the patient was able to be extubated several days ago and is currently on O 2 [place]. A calorie count is also in progress. Patient remains with several serious illnesses, PMT to continue to follow for further goals of care discussions, based on hospital course and overall disease trajectory of illness and patient/family's preferences.  ?No charge.  ?Loistine Chance MD ?[place] palliative.  ?[phone].  ?

## 2021-10-02 NOTE — Progress Notes (Signed)
?   ? ?Terri [name] for Infectious Disease ? ?Date of Admission:  08/31/2021    ?       ?Reason for visit: Follow up on disseminated Staph infection ?  ?Current antibiotics: ?Unasyn ? ?ASSESSMENT:   ? ?46 y.o. female admitted with: ? ?Staph aureus pneumonia: She completed 10 days of MRSA coverage with vancomycin --> Ceftaroline (changed due to issues with supratherapeutic vanc levels). ?Hepatic abscess: s/p aspiration 4/17 with cultures growing MSSA and bacteroides.  Currently covered with Unasyn. ?Osteomyelitis of left great toe: s/p amputation.  OR cultures grew MRSA. ?Back pain: Worsening pain in the setting of sacral decub and pressure ulcers, however, also concerning for possible infection in patient with disseminated Staph infection. ?Hypoxic respiratory failure: Stable on [place]. ?Candida esophagitis: Completed treatment. ?Protein calorie malnutrition: Albumin is 2.3. ? ?RECOMMENDATIONS:   ? ?Continue Unasyn ?TEE now that she appears to be stabilized from a respiratory standpoint ?MRI T and L spine to rule out infection in this area ?Wound care, nutrition ?Will follow.   ? ? ?Principal Problem: ?  Disseminated MSSA with liver abscess, left foot osteo and abscess, 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 ?Active Problems: ?  Opioid abuse with opioid-induced mood disorder ([place]) ?  Leucocytosis ?  Polysubstance abuse ([place]) ?  Acute on chronic blood loss anemia ?  Multilobar pneumonia with acute hypoxic respiratory failure ([place]) ?  Portal vein thrombosis ?  Candida infection, esophageal ([place]) ?  Hepatic abscess ?  Swelling of left hand ?  Ascites ?  Exudative pleural effusion ?  Hypophosphatemia ?  Vaginal discharge ?  Acute respiratory failure with hypoxia ([place]) ?  Involuntary commitment ?  Protein-calorie malnutrition, severe ? ? ? ?MEDICATIONS:   ? ?Scheduled Meds: ? amLODipine  10 mg Oral Daily  ? calcium carbonate  1 tablet Oral TID  ? chlorhexidine  15 mL Mouth Rinse BID  ? Chlorhexidine Gluconate Cloth  6 each Topical  Daily  ? cholecalciferol  1,000 Units Oral Daily  ? cloNIDine  0.1 mg Transdermal Weekly  ? feeding supplement  237 mL Oral TID BM  ? guaiFENesin  5 mL Oral Q6H  ? heparin injection (subcutaneous)  5,000 Units Subcutaneous Q8H  ? hydrALAZINE  50 mg Oral BID  ? levalbuterol  0.63 mg Nebulization TID  ? lidocaine  1 patch Transdermal Daily  ? lisinopril  10 mg Oral QHS  ? mouth rinse  15 mL Mouth Rinse q12n4p  ? multivitamin with minerals  1 tablet Oral Daily  ? nicotine  21 mg Transdermal Daily  ? nutrition supplement (JUVEN)  1 packet Oral BID BM  ? pantoprazole  40 mg Oral BID  ? sodium chloride flush  10-40 mL Intracatheter Q12H  ? sucralfate  1 g Oral TID WC & HS  ? thiamine  100 mg Oral Daily  ? ?Continuous Infusions: ? sodium chloride 10 mL/hr at 09/30/21 0800  ? sodium chloride Stopped (09/30/21 1352)  ? ampicillin-sulbactam (UNASYN) IV 3 g (10/02/21 0750)  ? magnesium sulfate bolus IVPB    ? potassium PHOSPHATE IVPB (in mmol)    ? TPN ADULT (ION) 90 mL/hr at 10/02/21 0400  ? ?PRN Meds:.sodium chloride, acetaminophen (TYLENOL) oral liquid 160 mg/5 mL **OR** acetaminophen, dextrose, fentaNYL (SUBLIMAZE) injection, guaiFENesin-dextromethorphan, haloperidol lactate **AND** [DISCONTINUED] LORazepam, hydrALAZINE, HYDROcodone-acetaminophen, lip balm, loperamide HCl, naLOXone (NARCAN)  injection, ondansetron (ZOFRAN) IV, saline, sodium chloride flush ? ?SUBJECTIVE:  ? ?24 hour events:  ?No acute events ?Afebrile ?Remains on [place] ?WBC 5.2, Hgb 8.2 ?  PLT 120 ?Remains on Unasyn ?No new imaging ?MRI spine pending ? ? ?She reports that her back feels awful.  She is asking for water.  She reports her breathing is stable. ? ?Review of Systems  ?All other systems reviewed and are negative. ? ?  ?OBJECTIVE:  ? ?Blood pressure (!) 143/101, pulse 83, temperature 98.1 ?F (36.7 ?C), temperature source Oral, resp. rate (!) 28, height '5\' 9"'$  (1.753 m), weight 52 kg, SpO2 99 %. ?Body mass index is 16.93 kg/m?. ? ?Physical  Exam ?Constitutional:   ?   Comments: Thin, cachetic, ill appearing woman lying in bed.   ?HENT:  ?   Head: Normocephalic and atraumatic.  ?Eyes:  ?   Extraocular Movements: Extraocular movements intact.  ?   Conjunctiva/sclera: Conjunctivae normal.  ?Pulmonary:  ?   Effort: Pulmonary effort is normal. No respiratory distress.  ?   Comments: On [place].  ?Abdominal:  ?   General: There is no distension.  ?   Palpations: Abdomen is soft.  ?   Tenderness: There is no abdominal tenderness.  ?Musculoskeletal:  ?   Cervical back: Normal range of motion and neck supple.  ?   Comments: Left great toe amputated  ?Skin: ?   General: Skin is warm and dry.  ?   Findings: No rash.  ?Neurological:  ?   General: No focal deficit present.  ?   Mental Status: She is oriented to person, place, and time.  ?Psychiatric:     ?   Mood and Affect: Mood normal.     ?   Behavior: Behavior normal.  ? ? ? ?Lab Results: ?Lab Results  ?Component Value Date  ? WBC 5.2 10/02/2021  ? HGB 8.2 (L) 10/02/2021  ? HCT 24.7 (L) 10/02/2021  ? MCV 80.2 10/02/2021  ? PLT 120 (L) 10/02/2021  ?  ?Lab Results  ?Component Value Date  ? NA 132 (L) 10/02/2021  ? K 3.8 10/02/2021  ? CO2 23 10/02/2021  ? GLUCOSE 94 10/02/2021  ? BUN 22 (H) 10/02/2021  ? CREATININE 0.33 (L) 10/02/2021  ? CALCIUM 7.9 (L) 10/02/2021  ? GFRNONAA >60 10/02/2021  ? GFRAA >60 07/15/2019  ?  ?Lab Results  ?Component Value Date  ? ALT 17 10/02/2021  ? AST 29 10/02/2021  ? ALKPHOS 122 10/02/2021  ? BILITOT 0.5 10/02/2021  ? ? ?   ?Component Value Date/Time  ? CRP 1.2 (H) 05/29/2021 0346  ? ? ?   ?Component Value Date/Time  ? ESRSEDRATE 20 02/26/2014 0958  ? ?  ?I have reviewed the micro and lab results in Epic. ? ?Imaging: ?No results found.  ? ?Imaging independently reviewed in Epic.  ? ? ?Terri [name] ?[place] for Infectious Disease ?[place] ?[phone] pager ?10/02/2021, 8:03 AM ? ? ?

## 2021-10-02 NOTE — Progress Notes (Addendum)
PHARMACY - TOTAL PARENTERAL NUTRITION CONSULT NOTE  ? ?Indication:  intolerance to enteral feeding ? ?Patient Measurements: ?Height: '5\' 9"'$  (175.3 cm) ?Weight: 52 kg (114 lb 10.2 oz) ?IBW/kg (Calculated) : 66.2 ?TPN AdjBW (KG): 66.7 ?Body mass index is 16.93 kg/m?. ?Usual Weight:  ? ?Assessment: 31 yoF admitted on 08/31/21 with bloody stools.  PMH significant for homelessness, recurrent GIB, chronic microcytic anemia, hypertension, polysubstance abuse, Roux n Y gastric bypass.  She is s/p endoscopy showing large anastomotic ulceration and is being treated with PPI.  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  ?Dobhoff feeding tube placed, required removal d/t unable to advance to correct position.  Pharmacy is now consulted to dose TPN.   ? ?Glucose / Insulin: CBGs all < 150,  DC  CBGs 5/12 ?Electrolytes: K 3.8 after 7 runs yesterday; Na 132; Phos 2.4, Mag 1.8 ?Renal: SCr 0.33, BUN 22 ?Hepatic: LFTs, Tbili WNL, Albumin 2.3 ?Intake / Output; MIVF: I/O neg 149. Stool 400/24 hrs, UOP 2100/24 hrs ?GI Imaging: ?GI Surgeries / Procedures:  ? ?Central access: PICC ?TPN start date: 5/9  ? ?Nutritional Goals: ?Goal TPN rate is 90 mL/hr (provides 114 g of protein and 2281 kcals per day) ? ?RD Assessment: (5/9) ?Estimated Needs ?Total Energy Estimated Needs: 2150-2350 kcal ?Total Protein Estimated Needs: 100-115 grams ?Total Fluid Estimated Needs: >/= 2.2 L/day ? ?Current Nutrition:  ?TPN ?FLD ?Ensure plus TID ?Juven bid> refused both doses 5/11 ? ?Plan:  ?Mag 1 gm  &  Kphos 10 mMol (13 meqK)  ?Continue TPN @ 90 ml/hr - goal rate provides 100% goals ?Electrolytes in TPN: Na 40mq/L, K 524m/L, Ca 76m32mL, Mg 76mE54m, and Phos 176mm14m. Cl:Ac 1:2 ?Oral MVI ordered 5/11 by RD but pt refused 5/11 but took today> DC MVI in TPN ?DC CBGs ?Monitor TPN labs on Mon/Thurs, CMET/Mag/Phos in AM ordered by TRH ?IV PPI> PO ?IV thiamine & Folic acid> PO ?Remains on FLD - f/u  advancement for appropriate wean of TPN as indicated ?48 Calorie count ordered, results 5/15 ? ? ?Terri [name],Terri [name] ?Clinical Pharmacist ?10/02/2021 7:09 AM ? ? ?

## 2021-10-03 DIAGNOSIS — J9601 Acute respiratory failure with hypoxia: Secondary | ICD-10-CM | POA: Diagnosis not present

## 2021-10-03 DIAGNOSIS — J181 Lobar pneumonia, unspecified organism: Secondary | ICD-10-CM | POA: Diagnosis not present

## 2021-10-03 DIAGNOSIS — K750 Abscess of liver: Secondary | ICD-10-CM | POA: Diagnosis not present

## 2021-10-03 DIAGNOSIS — B3781 Candidal esophagitis: Secondary | ICD-10-CM | POA: Diagnosis not present

## 2021-10-03 LAB — COMPREHENSIVE METABOLIC PANEL
ALT: 21 U/L (ref 0–44)
AST: 27 U/L (ref 15–41)
Albumin: 2.1 g/dL — ABNORMAL LOW (ref 3.5–5.0)
Alkaline Phosphatase: 122 U/L (ref 38–126)
Anion gap: 5 (ref 5–15)
BUN: 22 mg/dL — ABNORMAL HIGH (ref 6–20)
CO2: 23 mmol/L (ref 22–32)
Calcium: 8 mg/dL — ABNORMAL LOW (ref 8.9–10.3)
Chloride: 103 mmol/L (ref 98–111)
Creatinine, Ser: <0.3 mg/dL — ABNORMAL LOW (ref 0.44–1.00)
Glucose, Bld: 85 mg/dL (ref 70–99)
Potassium: 3.7 mmol/L (ref 3.5–5.1)
Sodium: 131 mmol/L — ABNORMAL LOW (ref 135–145)
Total Bilirubin: 0.4 mg/dL (ref 0.3–1.2)
Total Protein: 5.7 g/dL — ABNORMAL LOW (ref 6.5–8.1)

## 2021-10-03 LAB — GLUCOSE, CAPILLARY
Glucose-Capillary: 114 mg/dL — ABNORMAL HIGH (ref 70–99)
Glucose-Capillary: 122 mg/dL — ABNORMAL HIGH (ref 70–99)
Glucose-Capillary: 114 mg/dL — ABNORMAL HIGH (ref 70–99)
Glucose-Capillary: 103 mg/dL — ABNORMAL HIGH (ref 70–99)

## 2021-10-03 LAB — CBC WITH DIFFERENTIAL/PLATELET
Abs Immature Granulocytes: 0.01 10*3/uL (ref 0.00–0.07)
Basophils Absolute: 0 10*3/uL (ref 0.0–0.1)
Basophils Relative: 1 %
Eosinophils Absolute: 0.1 10*3/uL (ref 0.0–0.5)
Eosinophils Relative: 2 %
HCT: 23.1 % — ABNORMAL LOW (ref 36.0–46.0)
Hemoglobin: 7.7 g/dL — ABNORMAL LOW (ref 12.0–15.0)
Immature Granulocytes: 0 %
Lymphocytes Relative: 17 %
Lymphs Abs: 0.7 10*3/uL (ref 0.7–4.0)
MCH: 26.6 pg (ref 26.0–34.0)
MCHC: 33.3 g/dL (ref 30.0–36.0)
MCV: 79.9 fL — ABNORMAL LOW (ref 80.0–100.0)
Monocytes Absolute: 0.3 10*3/uL (ref 0.1–1.0)
Monocytes Relative: 6 %
Neutro Abs: 3.2 10*3/uL (ref 1.7–7.7)
Neutrophils Relative %: 74 %
Platelets: 92 10*3/uL — ABNORMAL LOW (ref 150–400)
RBC: 2.89 MIL/uL — ABNORMAL LOW (ref 3.87–5.11)
RDW: 21.7 % — ABNORMAL HIGH (ref 11.5–15.5)
WBC: 4.3 10*3/uL (ref 4.0–10.5)
nRBC: 0 % (ref 0.0–0.2)

## 2021-10-03 LAB — PHOSPHORUS: Phosphorus: 3.1 mg/dL (ref 2.5–4.6)

## 2021-10-03 LAB — MAGNESIUM: Magnesium: 1.9 mg/dL (ref 1.7–2.4)

## 2021-10-03 MED ORDER — TRAVASOL 10 % IV SOLN
INTRAVENOUS | Status: AC
  Filled 2021-10-03: qty 1144.8

## 2021-10-03 MED ORDER — POTASSIUM CHLORIDE CRYS ER 20 MEQ PO TBCR
20.0000 meq | EXTENDED_RELEASE_TABLET | Freq: Once | ORAL | Status: AC
  Administered 2021-10-03: 20 meq via ORAL
  Filled 2021-10-03: qty 1

## 2021-10-03 MED ORDER — DRONABINOL 2.5 MG PO CAPS
5.0000 mg | ORAL_CAPSULE | Freq: Two times a day (BID) | ORAL | Status: DC
  Administered 2021-10-03 – 2021-10-09 (×14): 5 mg via ORAL
  Filled 2021-10-03 (×3): qty 2
  Filled 2021-10-03 (×2): qty 1
  Filled 2021-10-03: qty 2
  Filled 2021-10-03: qty 1
  Filled 2021-10-03 (×4): qty 2
  Filled 2021-10-03 (×3): qty 1

## 2021-10-03 NOTE — Progress Notes (Signed)
Patient's MEWS = yellow due to RR = 25. Breathing treatment was given. No symptoms of respiratory distress. Patient is alert and oriented x 2. Pain is complain at lower back. PRN medication was given as ordered. ?

## 2021-10-03 NOTE — Progress Notes (Signed)
PHARMACY - TOTAL PARENTERAL NUTRITION CONSULT NOTE  ? ?Indication:  intolerance to enteral feeding ? ?Patient Measurements: ?Height: '5\' 9"'$  (175.3 cm) ?Weight: 52 kg (114 lb 10.2 oz) ?IBW/kg (Calculated) : 66.2 ?TPN AdjBW (KG): 66.7 ?Body mass index is 16.93 kg/m?. ?Usual Weight:  ? ?Assessment: 5 yoF admitted on 08/31/21 with bloody stools.  PMH significant for homelessness, recurrent GIB, chronic microcytic anemia, hypertension, polysubstance abuse, Roux n Y gastric bypass.  She is s/p endoscopy showing large anastomotic ulceration and is being treated with PPI.  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  ?Dobhoff feeding tube placed, required removal d/t unable to advance to correct position.  Pharmacy is now consulted to dose TPN.   ? ?Glucose / Insulin: CBGs all < 150,  DC'd  CBGs 5/12 ?Electrolytes: K 3.7 after 13 meq K phos; Na 131; Phos 3.1, Mag 1.9 after 1 gm Mag ?Renal: SCr 0.3, BUN 22 ?Hepatic: LFTs, Tbili WNL, Albumin 2.1 ?Intake / Output; MIVF: I/O neg 263. Stool 650/24 hrs, UOP 2425/24 hrs ?GI Imaging: ?GI Surgeries / Procedures:  ? ?Central access: PICC ?TPN start date: 5/9  ? ?Nutritional Goals: ?Goal TPN rate is 90 mL/hr (provides 114 g of protein and 2281 kcals per day) ? ?RD Assessment: (5/9) ?Estimated Needs ?Total Energy Estimated Needs: 2150-2350 kcal ?Total Protein Estimated Needs: 100-115 grams ?Total Fluid Estimated Needs: >/= 2.2 L/day ? ?Current Nutrition:  ?TPN ?FLD ?Ensure plus TID- refused all doses 5/12 ?Juven bid> refused all doses 5/11 & 5/12 ?5/13 Marinol added for anorexia ? ?Plan:  ?Kdur 20 po x 1 dose ?Continue TPN @ 90 ml/hr - goal rate provides 100% goals ?Electrolytes in TPN: Na 168mq/L, K 527m/L, Ca 95m49mL, Mg 95mE48m, and Phos 195mm32m. Cl:Ac 1:2 ?Oral MVI, thiamine, folic acid, calcium, vit D, PPI ?Monitor TPN labs on Mon/Thurs, CMET/Mag/Phos in AM ordered by TRH ?Remains on FLD - f/u advancement for  appropriate wean of TPN as indicated ?48 Calorie count ordered, results 5/15 ? ? ?Chika Cichowski,Eudelia BunchrmD ?Clinical Pharmacist ?10/03/2021 8:10 AM ? ? ?

## 2021-10-03 NOTE — Progress Notes (Signed)
?PROGRESS NOTE ? ? ? ?Terri [name]  FGH:[id number] DOB: 09-08-1975 DOA: 08/31/2021 ?PCP: Pcp, No  ? ? ? ?Brief Narrative:  ?46 year old with past medical history significant for homelessness, recurrent GIB, chronic microcytic anemia, hypertension, polysubstance abuse, Roux n Y presented to [facility] long hospital complaining of dark color related stool.  Patient has history of chronic GI bleed secondary to peptic ulcers.  She underwent endoscopy with large anastomotic ulceration noted and is started on PPI.  Hemoglobin is stabilized. ?  ?She was subsequently found to have MSSA bacteremia that appears to be disseminated.  Her left great toe was noted to be gangrenous, vascular surgery and orthopedics determined amputation will be best route for patient and this was done on 4/26.  Culture from great toe grew with MRSA and patient was broadened to vancomycin.  She was found to have multiple liver abscess, and history of portal vein thrombosis. ?  ?During hospital course she started to require increased oxygen support, ultimately transferred to ICU and intubated. ? ? ?Subjective: ?5/13 afebrile overnight A/O x4.  NOTE contacted by RN Gwendolyn Fill who spoke with pt's aunt; she's concerned that the patient's sister and/or boyfriend may have brought cocaine in the hospital for patient to consume. ? ?  ?Assessment & Plan: ?Covid vaccination; ?  ?Principal Problem: ?  Disseminated MSSA with liver abscess, left foot osteo and abscess, 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 ?Active Problems: ?  Multilobar pneumonia with acute hypoxic respiratory failure ([place]) ?  Hepatic abscess ?  Acute respiratory failure with hypoxia ([place]) ?  Acute on chronic blood loss anemia ?  Swelling of left hand ?  Portal vein thrombosis ?  Exudative pleural effusion ?  Involuntary commitment ?  Ascites ?  Vaginal discharge ?  Polysubstance abuse ([place]) ?  Hypophosphatemia ?  Candida infection, esophageal ([place]) ?  Opioid abuse with opioid-induced mood disorder ([place]) ?   Leucocytosis ?  Protein-calorie malnutrition, severe ?  Goals of care, counseling/discussion ? ?Acute respiratory failure with hypoxia and hypercapnia/positive MRSA pneumonia ?-Intubated 09/20/2021  ?-5/5 Extubation ?-Treated  with Teflaro and IV Flagyl initially.  ?-Completed 10 days coverage MRSA PNA>  ?-Follow ID recommendation ? ?Polymicrobial liver abscess (MSSA hepatic abscess) ?-Now on Unasyn to cover Liver Abscess. Will also cover Bacteroids ?-She will need TEE when more stable, per ID.  ?-Per ID note 4/18 will need at least 6 weeks of antibiotics ?-5/13 discussed case with Dr. Jule Ser, ID who stated we should consider this as week 4 of antibiotic use.  Patient will need antibiotics for 2 more weeks. ?-5/13 contact cardiology on Monday evaluate if they believe patient is stable enough to tolerate TEE ? ?Disseminated MSSA infection, osteo LEFT great toe  ?-S/p status post amputation ? ?Left groin possible abscess versus mass; ?-5 cm mass left groin and Doppler X lower extremity 09/20/2021. ?-Not seen on nonvascular ultrasound of left groin 5/1 Will need follow-up ? ?Osteomyelitis of thoracic/lumbar spine? ?- 5/12 MRI thoracic/lumbar spine pending ? ?Bilateral pleural effusion, right>> left ?-4/23 s/p status post RIGHT thoracentesis; exudative based on pleural fluid study.  Culture negative. ?-5/7 CXR : Stable infiltrate in the lateral left lung base, small layering right effusion with underlying opacities stable to mildly more prominent.  There is new right perihilar opacity ? ?Volume overload, pulmonary edema acute ?-Received IV lasix and albumin 5/07 ?-Strict in and out -2.2 L ?- Daily weight ?Filed Weights  ? 09/29/21 0403 09/30/21 0408 10/03/21 6789  ?Weight: 54.8 kg 52 kg 52 kg  ?  ? ?  Hypertensive urgency/Essential HTN ?-5/10 amlodipine 10 mg daily ?- 5/10 Catapres 0.1 mg transdermal ?-5/10 hydralazine 50 mg BID ?-5/10 hydralazine IV PRN ?-5/10 lisinopril 10 mg daily ?- 5/10 NTG drip goal SBP  110-130 ?-5/11 BP now at goal ?  ?  ?Acute encephalopathy  ?-Multifactorial sepsis, Chronic narcotic dependence. ?-Has been seen by psych and deemed not to have capacity to make medical decisions ?-Resolved ?  ?History of portal vein thrombosis:  ?-No candidate for anticoagulation.  ?  ?Acute GI bleed //Acute blood loss anemia;  ?admitted with a hemoglobin of 4.6 superimposed on chronic microcytic anemia ?-Endoscopy: Large anastomotic Ulceration.  ?On IV PPI.  ?Lab Results  ?Component Value Date  ? HGB 7.7 (L) 10/03/2021  ? HGB 8.2 (L) 10/02/2021  ? HGB 8.6 (L) 10/01/2021  ? HGB 9.6 (L) 09/30/2021  ? HGB 7.8 (L) 09/29/2021  ?-Stable ?  ?Anemia ?-Anemia panel pending ? ?Thrombocytopenia:  ?Likely related to infection ?Resolved ?  ?Hypokalemia: ?-Potassium goal> 4 ?- 5/11 potassium IV 70 mEq ? ?Hypocalcemia ?- Corrected calcium= 9.14 no need for repletion at this time continue to monitor. ?  ?Hypoglycemia:  ?-5/11 TPN at goal, DC D10 ?  ?Homelessness ?-Will need to be addressed by LCSW ? ?Polysubstance abuse ?-4/10 positive cocaine ?Psych was consulted and recommended IVC, may need to revisit capacity again prior to discharge if cooperation becomes an issue. ?-5/13 patient's sister and/or boyfriend may have brought cocaine in the hospital for patient to consume. ?-5/13 have requested telemetry sitter, to ensure no illegal substances are being brought into patient ? ?Vitamin D deficiency ?-On vitamin D supplements ? ?Severe protein caloric malnutrition and failure to thrive ?-She failed a swallow evaluation.  She will need core track for tube feedings ?-Started  IV thiamine ?-Core track unable to be advance by IR> will ask GI to see.  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  He will not be possible to place a tube in her stomach due to limited space.  She also has a large anastomotic ulcer which complicate things.  We can consider surgery consult for a J-tube placement  due to her anatomy. ?-5/8 started TPN .  She is probably very weak to undergoing surgical procedure for J tube placement. She passed swallow evaluation, started on full liquid diet. Hopefully we can discontinue TPN soon.  ?-5/11-calorie count pending ? ?Anorexia ?- 5/13 Marinol 5 mg BID ? ? ?Pressure Injury 09/24/21 Sacrum Medial Deep Tissue Pressure Injury - Purple or maroon localized area of discolored intact skin or blood-filled blister due to damage of underlying soft tissue from pressure and/or shear. several deep tissue injuries, dark  (Active)  ?09/24/21 1100  ?Location: Sacrum  ?Location Orientation: Medial  ?Staging: Deep Tissue Pressure Injury - Purple or maroon localized area of discolored intact skin or blood-filled blister due to damage of underlying soft tissue from pressure and/or shear.  ?Wound Description (Comments): several deep tissue injuries, dark purple, non-blanchable  ?Present on Admission: No  ?Dressing Type Foam - Lift dressing to assess site every shift 10/02/21 0936  ?   ?Pressure Injury 09/24/21 Buttocks Left;Posterior;Proximal Deep Tissue Pressure Injury - Purple or maroon localized area of discolored intact skin or blood-filled blister due to damage of underlying soft tissue from pressure and/or shear. several purple,  (Active)  ?09/24/21 1100  ?Location: Buttocks (gluteal fold)  ?Location Orientation: Left;Posterior;Proximal  ?Staging: Deep Tissue Pressure Injury - Purple or maroon localized area of  discolored intact skin or blood-filled blister due to damage of underlying soft tissue from pressure and/or shear.  ?Wound Description (Comments): several purple, non-blanchable  ?Present on Admission: No  ?Dressing Type Foam - Lift dressing to assess site every shift 10/02/21 0936  ?   ?Pressure Injury 09/24/21 Vertebral column Lower Deep Tissue Pressure Injury - Purple or maroon localized area of discolored intact skin or blood-filled blister due to damage of underlying soft tissue from  pressure and/or shear. spinal column deep tissue, (Active)  ?09/24/21 1100  ?Location: Vertebral column  ?Location Orientation: Lower  ?Staging: Deep Tissue Pressure Injury - Purple or maroon localized area of discolo

## 2021-10-03 NOTE — Progress Notes (Signed)
Pt stable at time of transfer. Sister notified of transfer. Report called ? ?

## 2021-10-03 NOTE — Progress Notes (Signed)
Patient's aunt Diannia Ruder) called unit and expressed concern to this RN that patient may be receiving drugs ("cocaine and crack") from sister. Sister reportedly began acting suspiciously when asked about it by aunt. MD and patient's RN made aware, tele sitter ordered ?

## 2021-10-04 DIAGNOSIS — K750 Abscess of liver: Secondary | ICD-10-CM | POA: Diagnosis not present

## 2021-10-04 DIAGNOSIS — J181 Lobar pneumonia, unspecified organism: Secondary | ICD-10-CM | POA: Diagnosis not present

## 2021-10-04 DIAGNOSIS — B3781 Candidal esophagitis: Secondary | ICD-10-CM | POA: Diagnosis not present

## 2021-10-04 DIAGNOSIS — J9601 Acute respiratory failure with hypoxia: Secondary | ICD-10-CM | POA: Diagnosis not present

## 2021-10-04 LAB — COMPREHENSIVE METABOLIC PANEL
ALT: 23 U/L (ref 0–44)
AST: 27 U/L (ref 15–41)
Albumin: 2.1 g/dL — ABNORMAL LOW (ref 3.5–5.0)
Alkaline Phosphatase: 122 U/L (ref 38–126)
Anion gap: 6 (ref 5–15)
BUN: 23 mg/dL — ABNORMAL HIGH (ref 6–20)
CO2: 23 mmol/L (ref 22–32)
Calcium: 8.3 mg/dL — ABNORMAL LOW (ref 8.9–10.3)
Chloride: 106 mmol/L (ref 98–111)
Creatinine, Ser: <0.3 mg/dL — ABNORMAL LOW (ref 0.44–1.00)
Glucose, Bld: 80 mg/dL (ref 70–99)
Potassium: 4.2 mmol/L (ref 3.5–5.1)
Sodium: 135 mmol/L (ref 135–145)
Total Bilirubin: 0.4 mg/dL (ref 0.3–1.2)
Total Protein: 5.5 g/dL — ABNORMAL LOW (ref 6.5–8.1)

## 2021-10-04 LAB — CBC WITH DIFFERENTIAL/PLATELET
Abs Immature Granulocytes: 0.02 10*3/uL (ref 0.00–0.07)
Basophils Absolute: 0 10*3/uL (ref 0.0–0.1)
Basophils Relative: 1 %
Eosinophils Absolute: 0 10*3/uL (ref 0.0–0.5)
Eosinophils Relative: 1 %
HCT: 21.9 % — ABNORMAL LOW (ref 36.0–46.0)
Hemoglobin: 7.2 g/dL — ABNORMAL LOW (ref 12.0–15.0)
Immature Granulocytes: 1 %
Lymphocytes Relative: 25 %
Lymphs Abs: 0.8 10*3/uL (ref 0.7–4.0)
MCH: 26.3 pg (ref 26.0–34.0)
MCHC: 32.9 g/dL (ref 30.0–36.0)
MCV: 79.9 fL — ABNORMAL LOW (ref 80.0–100.0)
Monocytes Absolute: 0.2 10*3/uL (ref 0.1–1.0)
Monocytes Relative: 7 %
Neutro Abs: 2.1 10*3/uL (ref 1.7–7.7)
Neutrophils Relative %: 65 %
Platelets: 102 10*3/uL — ABNORMAL LOW (ref 150–400)
RBC: 2.74 MIL/uL — ABNORMAL LOW (ref 3.87–5.11)
RDW: 22.3 % — ABNORMAL HIGH (ref 11.5–15.5)
WBC: 3.2 10*3/uL — ABNORMAL LOW (ref 4.0–10.5)
nRBC: 0 % (ref 0.0–0.2)

## 2021-10-04 LAB — PHOSPHORUS: Phosphorus: 3.4 mg/dL (ref 2.5–4.6)

## 2021-10-04 LAB — MAGNESIUM: Magnesium: 1.9 mg/dL (ref 1.7–2.4)

## 2021-10-04 MED ORDER — TRAVASOL 10 % IV SOLN
INTRAVENOUS | Status: AC
  Filled 2021-10-04: qty 1144.8

## 2021-10-04 MED ORDER — ALTEPLASE 2 MG IJ SOLR
2.0000 mg | Freq: Once | INTRAMUSCULAR | Status: AC
  Administered 2021-10-04: 2 mg
  Filled 2021-10-04: qty 2

## 2021-10-04 NOTE — Progress Notes (Signed)
?PROGRESS NOTE ? ? ? ?Terri [name]  ACZ:[id number] DOB: 1975/08/11 DOA: 08/31/2021 ?PCP: Pcp, No  ? ? ? ?Brief Narrative:  ?46 year old with past medical history significant for homelessness, recurrent GIB, chronic microcytic anemia, hypertension, polysubstance abuse, Roux n Y presented to [facility] long hospital complaining of dark color related stool.  Patient has history of chronic GI bleed secondary to peptic ulcers.  She underwent endoscopy with large anastomotic ulceration noted and is started on PPI.  Hemoglobin is stabilized. ?  ?She was subsequently found to have MSSA bacteremia that appears to be disseminated.  Her left great toe was noted to be gangrenous, vascular surgery and orthopedics determined amputation will be best route for patient and this was done on 4/26.  Culture from great toe grew with MRSA and patient was broadened to vancomycin.  She was found to have multiple liver abscess, and history of portal vein thrombosis. ?  ?During hospital course she started to require increased oxygen support, ultimately transferred to ICU and intubated. ? ? ?Subjective: ?5/14 afebrile overnight A/O x3 (does not know when). ? ?  ?Assessment & Plan: ?Covid vaccination; ?  ?Principal Problem: ?  Disseminated MSSA with liver abscess, left foot osteo and abscess, 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 ?Active Problems: ?  Multilobar pneumonia with acute hypoxic respiratory failure ([place]) ?  Hepatic abscess ?  Acute respiratory failure with hypoxia ([place]) ?  Acute on chronic blood loss anemia ?  Swelling of left hand ?  Portal vein thrombosis ?  Exudative pleural effusion ?  Involuntary commitment ?  Ascites ?  Vaginal discharge ?  Polysubstance abuse ([place]) ?  Hypophosphatemia ?  Candida infection, esophageal ([place]) ?  Opioid abuse with opioid-induced mood disorder ([place]) ?  Leucocytosis ?  Protein-calorie malnutrition, severe ?  Goals of care, counseling/discussion ? ?Acute respiratory failure with hypoxia and hypercapnia/positive MRSA  pneumonia ?-Intubated 09/20/2021  ?-5/5 Extubation ?-Treated  with Teflaro and IV Flagyl initially.  ?-Completed 10 days coverage MRSA PNA>  ?-Follow ID recommendation ? ?Polymicrobial liver abscess (MSSA hepatic abscess) ?-Now on Unasyn to cover Liver Abscess. Will also cover Bacteroids ?-She will need TEE when more stable, per ID.  ?-Per ID note 4/18 will need at least 6 weeks of antibiotics ?-5/13 discussed case with Dr. Jule Ser, ID who stated we should consider this as week 4 of antibiotic use.  Patient will need antibiotics for 2 more weeks. ?-5/13 contact cardiology on Monday evaluate if they believe patient is stable enough to tolerate TEE ? ?Disseminated MSSA infection, osteo LEFT great toe  ?-S/p status post amputation ? ?Left groin possible abscess versus mass; ?-5 cm mass left groin and Doppler X lower extremity 09/20/2021. ?-Not seen on nonvascular ultrasound of left groin 5/1 Will need follow-up ? ?Osteomyelitis of thoracic/lumbar spine? ?- 5/12 MRI thoracic/lumbar spine pending ? ?Bilateral pleural effusion, right>> left ?-4/23 s/p status post RIGHT thoracentesis; exudative based on pleural fluid study.  Culture negative. ?-5/7 CXR : Stable infiltrate in the lateral left lung base, small layering right effusion with underlying opacities stable to mildly more prominent.  There is new right perihilar opacity ? ?Volume overload, pulmonary edema acute ?-Received IV lasix and albumin 5/07 ?-Strict in and out -2.4 L ?- Daily weight ?Filed Weights  ? 09/30/21 0408 10/03/21 0614 10/04/21 0500  ?Weight: 52 kg 52 kg 49.4 kg  ?  ? ?Hypertensive urgency/Essential HTN ?-5/10 amlodipine 10 mg daily ?- 5/10 Catapres 0.1 mg transdermal ?-5/10 hydralazine 50 mg BID ?-5/10 hydralazine IV PRN ?-  5/10 lisinopril 10 mg daily ?- 5/10 NTG drip goal SBP 110-130 ?-5/11 BP now at goal ?  ?  ?Acute encephalopathy  ?-Multifactorial sepsis, Chronic narcotic dependence. ?-Has been seen by psych and deemed not to have capacity  to make medical decisions ?-Resolved ?  ?History of portal vein thrombosis:  ?-No candidate for anticoagulation.  ?  ?Acute GI bleed //Acute blood loss anemia;  ?admitted with a hemoglobin of 4.6 superimposed on chronic microcytic anemia ?-Endoscopy: Large anastomotic Ulceration.  ?On IV PPI.  ?Lab Results  ?Component Value Date  ? HGB 7.2 (L) 10/04/2021  ? HGB 7.7 (L) 10/03/2021  ? HGB 8.2 (L) 10/02/2021  ? HGB 8.6 (L) 10/01/2021  ? HGB 9.6 (L) 09/30/2021  ?-Stable ?  ?Anemia ?-Anemia panel pending ? ?Thrombocytopenia:  ?-Likely related to infection ?-Improved ?  ?Hypokalemia: ?-Potassium goal> 4 ?- 5/11 potassium IV 70 mEq ? ?Hypocalcemia ?- Corrected calcium= 9.14 no need for repletion at this time continue to monitor. ?  ?Hypoglycemia:  ?-5/11 TPN at goal, DC D10 ?  ?Homelessness ?-Will need to be addressed by LCSW ? ?Polysubstance abuse ?-4/10 positive cocaine ?Psych was consulted and recommended IVC, may need to revisit capacity again prior to discharge if cooperation becomes an issue. ?-5/13 patient's sister and/or boyfriend may have brought cocaine in the hospital for patient to consume. ?-5/13 have requested telemetry sitter, to ensure no illegal substances are being brought into patient ? ?Vitamin D deficiency ?-On vitamin D supplements ? ?Severe protein caloric malnutrition and failure to thrive ?-She failed a swallow evaluation.  She will need core track for tube feedings ?-Started  IV thiamine ?-Core track unable to be advance by IR> will ask GI to see.  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  He will not be possible to place a tube in her stomach due to limited space.  She also has a large anastomotic ulcer which complicate things.  We can consider surgery consult for a J-tube placement due to her anatomy. ?-5/8 started TPN .  She is probably very weak to undergoing surgical procedure for J tube placement. She passed swallow evaluation, started on full  liquid diet. Hopefully we can discontinue TPN soon.  ?-5/11-calorie count pending ? ?Anorexia ?- 5/13 Marinol 5 mg BID ? ? ?Pressure Injury 09/24/21 Sacrum Medial Deep Tissue Pressure Injury - Purple or maroon localized area of discolored intact skin or blood-filled blister due to damage of underlying soft tissue from pressure and/or shear. several deep tissue injuries, dark  (Active)  ?09/24/21 1100  ?Location: Sacrum  ?Location Orientation: Medial  ?Staging: Deep Tissue Pressure Injury - Purple or maroon localized area of discolored intact skin or blood-filled blister due to damage of underlying soft tissue from pressure and/or shear.  ?Wound Description (Comments): several deep tissue injuries, dark purple, non-blanchable  ?Present on Admission: No  ?Dressing Type Foam - Lift dressing to assess site every shift 10/04/21 0800  ?   ?Pressure Injury 09/24/21 Buttocks Left;Posterior;Proximal Deep Tissue Pressure Injury - Purple or maroon localized area of discolored intact skin or blood-filled blister due to damage of underlying soft tissue from pressure and/or shear. several purple,  (Active)  ?09/24/21 1100  ?Location: Buttocks (gluteal fold)  ?Location Orientation: Left;Posterior;Proximal  ?Staging: Deep Tissue Pressure Injury - Purple or maroon localized area of discolored intact skin or blood-filled blister due to damage of underlying soft tissue from pressure and/or shear.  ?Wound Description (Comments): several purple,  non-blanchable  ?Present on Admission: No  ?Dressing Type Foam - Lift dressing to assess site every shift 10/04/21 0800  ?   ?Pressure Injury 09/24/21 Vertebral column Lower Deep Tissue Pressure Injury - Purple or maroon localized area of discolored intact skin or blood-filled blister due to damage of underlying soft tissue from pressure and/or shear. spinal column deep tissue, (Active)  ?09/24/21 1100  ?Location: Vertebral column  ?Location Orientation: Lower  ?Staging: Deep Tissue Pressure  Injury - Purple or maroon localized area of discolored intact skin or blood-filled blister due to damage of underlying soft tissue from pressure and/or shear.  ?Wound Description (Comments): spinal column

## 2021-10-04 NOTE — Progress Notes (Signed)
PHARMACY - TOTAL PARENTERAL NUTRITION CONSULT NOTE  ? ?Indication:  intolerance to enteral feeding ? ?Patient Measurements: ?Height: '5\' 9"'$  (175.3 cm) ?Weight: 49.4 kg (108 lb 14.5 oz) ?IBW/kg (Calculated) : 66.2 ?TPN AdjBW (KG): 66.7 ?Body mass index is 16.08 kg/m?. ?Usual Weight:  ? ?Assessment: 60 yoF admitted on 08/31/21 with bloody stools.  PMH significant for homelessness, recurrent GIB, chronic microcytic anemia, hypertension, polysubstance abuse, Roux n Y gastric bypass.  She is s/p endoscopy showing large anastomotic ulceration and is being treated with PPI.  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  ?Dobhoff feeding tube placed, required removal d/t unable to advance to correct position.  Pharmacy is now consulted to dose TPN.   ? ?Glucose / Insulin: CBGs all < 150,  DC'd  CBGs 5/12 ?Electrolytes: WNL ?Renal: SCr 0.3, BUN 23 ?Hepatic: LFTs, Tbili WNL, Albumin 2.1 ?Intake / Output; MIVF: I/O +1833.8. Stool 250/24 hrs, UOP 550/24 hrs ?GI Imaging: ?GI Surgeries / Procedures:  ? ?Central access: PICC ?TPN start date: 5/9  ? ?Nutritional Goals: ?Goal TPN rate is 90 mL/hr (provides 114 g of protein and 2281 kcals per day) ? ?RD Assessment: (5/9) ?Estimated Needs ?Total Energy Estimated Needs: 2150-2350 kcal ?Total Protein Estimated Needs: 100-115 grams ?Total Fluid Estimated Needs: >/= 2.2 L/day ? ?Current Nutrition:  ?TPN ?Dys 3 diet ?Ensure plus TID- refused all doses 5/12, took 1 dose 5/13 ?Juven bid> refused all doses 5/11 & 5/12 & 5/13 ?5/13 Marinol added for anorexia ? ?Plan:  ?Continue TPN @ 90 ml/hr - goal rate provides 100% goals ?Electrolytes in TPN: Na 117mq/L, K 5101m/L, Ca 18m61mL, Mg 18mE37m, and Phos 118mm48m. Cl:Ac 1:2 ?Oral MVI, thiamine, folic acid, calcium, vit D, PPI ?Monitor TPN labs on Mon/Thurs ?On Dys 3 diet - f/u wean off TPN as indicated ?48 Calorie count ordered, results 5/15 ? ? ?MicheEudelia Bunchrm.D ?10/04/2021  9:02 AM ?Clinical Pharmacist ? ? ?

## 2021-10-04 NOTE — Plan of Care (Signed)
?  Problem: Education: ?Goal: Ability to identify signs and symptoms of gastrointestinal bleeding will improve ?Outcome: Progressing ?  ?Problem: Bowel/Gastric: ?Goal: Will show no signs and symptoms of gastrointestinal bleeding ?Outcome: Progressing ?  ?Problem: Clinical Measurements: ?Goal: Complications related to the disease process, condition or treatment will be avoided or minimized ?Outcome: Progressing ?  ?Problem: Health Behavior/Discharge Planning: ?Goal: Ability to manage health-related needs will improve ?Outcome: Progressing ?  ?Problem: Clinical Measurements: ?Goal: Ability to maintain clinical measurements within normal limits will improve ?Outcome: Progressing ?Goal: Will remain free from infection ?Outcome: Progressing ?Goal: Diagnostic test results will improve ?Outcome: Progressing ?Goal: Respiratory complications will improve ?Outcome: Progressing ?Goal: Cardiovascular complication will be avoided ?Outcome: Progressing ?  ?Problem: Activity: ?Goal: Risk for activity intolerance will decrease ?Outcome: Progressing ?  ?Problem: Nutrition: ?Goal: Adequate nutrition will be maintained ?Outcome: Progressing ?  ?Problem: Coping: ?Goal: Level of anxiety will decrease ?Outcome: Progressing ?  ?Problem: Elimination: ?Goal: Will not experience complications related to bowel motility ?Outcome: Progressing ?Goal: Will not experience complications related to urinary retention ?Outcome: Progressing ?  ?Problem: Pain Managment: ?Goal: General experience of comfort will improve ?Outcome: Progressing ?  ?Problem: Safety: ?Goal: Ability to remain free from injury will improve ?Outcome: Progressing ?  ?Problem: Skin Integrity: ?Goal: Risk for impaired skin integrity will decrease ?Outcome: Progressing ?  ?Problem: Safety: ?Goal: Non-violent Restraint(s) ?Outcome: Progressing ?  ?

## 2021-10-05 ENCOUNTER — Inpatient Hospital Stay: Payer: Self-pay

## 2021-10-05 ENCOUNTER — Inpatient Hospital Stay (HOSPITAL_COMMUNITY): Payer: Medicaid Other

## 2021-10-05 DIAGNOSIS — B3781 Candidal esophagitis: Secondary | ICD-10-CM | POA: Diagnosis not present

## 2021-10-05 DIAGNOSIS — J181 Lobar pneumonia, unspecified organism: Secondary | ICD-10-CM | POA: Diagnosis not present

## 2021-10-05 DIAGNOSIS — K750 Abscess of liver: Secondary | ICD-10-CM | POA: Diagnosis not present

## 2021-10-05 DIAGNOSIS — I96 Gangrene, not elsewhere classified: Secondary | ICD-10-CM | POA: Diagnosis not present

## 2021-10-05 LAB — MAGNESIUM: Magnesium: 1.7 mg/dL (ref 1.7–2.4)

## 2021-10-05 LAB — COMPREHENSIVE METABOLIC PANEL
ALT: 23 U/L (ref 0–44)
AST: 26 U/L (ref 15–41)
Albumin: 2 g/dL — ABNORMAL LOW (ref 3.5–5.0)
Alkaline Phosphatase: 115 U/L (ref 38–126)
Anion gap: 5 (ref 5–15)
BUN: 20 mg/dL (ref 6–20)
CO2: 22 mmol/L (ref 22–32)
Calcium: 7.9 mg/dL — ABNORMAL LOW (ref 8.9–10.3)
Chloride: 105 mmol/L (ref 98–111)
Creatinine, Ser: 0.34 mg/dL — ABNORMAL LOW (ref 0.44–1.00)
GFR, Estimated: >60 mL/min (ref >60)
Glucose, Bld: 76 mg/dL (ref 70–99)
Potassium: 4.3 mmol/L (ref 3.5–5.1)
Sodium: 132 mmol/L — ABNORMAL LOW (ref 135–145)
Total Bilirubin: 0.6 mg/dL (ref 0.3–1.2)
Total Protein: 5.4 g/dL — ABNORMAL LOW (ref 6.5–8.1)

## 2021-10-05 LAB — CBC WITH DIFFERENTIAL/PLATELET
Abs Immature Granulocytes: 0.01 10*3/uL (ref 0.00–0.07)
Basophils Absolute: 0 10*3/uL (ref 0.0–0.1)
Basophils Relative: 1 %
Eosinophils Absolute: 0.1 10*3/uL (ref 0.0–0.5)
Eosinophils Relative: 2 %
HCT: 21 % — ABNORMAL LOW (ref 36.0–46.0)
Hemoglobin: 7 g/dL — ABNORMAL LOW (ref 12.0–15.0)
Immature Granulocytes: 0 %
Lymphocytes Relative: 29 %
Lymphs Abs: 0.9 10*3/uL (ref 0.7–4.0)
MCH: 27 pg (ref 26.0–34.0)
MCHC: 33.3 g/dL (ref 30.0–36.0)
MCV: 81.1 fL (ref 80.0–100.0)
Monocytes Absolute: 0.3 10*3/uL (ref 0.1–1.0)
Monocytes Relative: 10 %
Neutro Abs: 1.7 10*3/uL (ref 1.7–7.7)
Neutrophils Relative %: 58 %
Platelets: 107 10*3/uL — ABNORMAL LOW (ref 150–400)
RBC: 2.59 MIL/uL — ABNORMAL LOW (ref 3.87–5.11)
RDW: 23.1 % — ABNORMAL HIGH (ref 11.5–15.5)
WBC: 2.9 10*3/uL — ABNORMAL LOW (ref 4.0–10.5)
nRBC: 0 % (ref 0.0–0.2)

## 2021-10-05 LAB — PREPARE RBC (CROSSMATCH)

## 2021-10-05 LAB — GLUCOSE, CAPILLARY: Glucose-Capillary: 82 mg/dL (ref 70–99)

## 2021-10-05 LAB — PHOSPHORUS: Phosphorus: 4.1 mg/dL (ref 2.5–4.6)

## 2021-10-05 LAB — TRIGLYCERIDES: Triglycerides: 25 mg/dL (ref <150)

## 2021-10-05 MED ORDER — SODIUM CHLORIDE 0.9% IV SOLUTION: Freq: Once | INTRAVENOUS | Status: AC

## 2021-10-05 MED ORDER — TRAVASOL 10 % IV SOLN
INTRAVENOUS | Status: AC
  Filled 2021-10-05: qty 1144.8

## 2021-10-05 MED ORDER — IPRATROPIUM-ALBUTEROL 0.5-2.5 (3) MG/3ML IN SOLN
3.0000 mL | RESPIRATORY_TRACT | Status: DC | PRN
Start: 2021-10-05 — End: 2021-10-06
  Administered 2021-10-05: 3 mL via RESPIRATORY_TRACT
  Filled 2021-10-05: qty 3

## 2021-10-05 NOTE — Progress Notes (Signed)
?   10/05/21 0842  ?Assess: MEWS Score  ?Resp (!) 27  ?SpO2 98 %  ?O2 Device Nasal Cannula  ?O2 Flow Rate (L/min) 2 L/min  ?FiO2 (%) 28 %  ?Assess: MEWS Score  ?MEWS Temp 0  ?MEWS Systolic 0  ?MEWS Pulse 0  ?MEWS RR 2  ?MEWS LOC 0  ?MEWS Score 2  ?MEWS Score Color Yellow  ?Assess: if the MEWS score is Yellow or Red  ?Were vital signs taken at a resting state? Yes  ?Focused Assessment No change from prior assessment ?(Night shift reporting handoff that RR high overnight)  ?Does the patient meet 2 or more of the SIRS criteria? No  ?MEWS guidelines implemented *See Row Information* Yes  ?Escalate  ?MEWS: Escalate Yellow: discuss with charge nurse/RN and consider discussing with provider and RRT  ?Notify: Charge Nurse/RN  ?Name of Charge Nurse/RN Notified Pamala Hurry  ?Date Charge Nurse/RN Notified 10/05/21  ?Time Charge Nurse/RN Notified 0845  ?Assess: SIRS CRITERIA  ?SIRS Temperature  0  ?SIRS Pulse 0  ?SIRS Respirations  1  ?SIRS WBC 1  ?SIRS Score Sum  2  ? ? ?

## 2021-10-05 NOTE — Progress Notes (Signed)
Peripherally Inserted Central Catheter Exchange ? ?The IV Nurse has discussed with the patient and/or persons authorized to consent for the patient, the purpose of this procedure and the potential benefits and risks involved with this procedure.  The benefits include less needle sticks, lab draws from the catheter, and the patient may be discharged home with the catheter. Risks include, but not limited to, infection, bleeding, blood clot (thrombus formation), and puncture of an artery; nerve damage and irregular heartbeat and possibility to perform a PICC exchange if needed/ordered by physician.  Alternatives to this procedure were also discussed.  Bard Power PICC patient education guide, fact sheet on infection prevention and patient information card has been provided to patient /or left at bedside.   ? ?PICC Placement Documentation  ?PICC Double Lumen 10/05/21 Right 35 cm 0 cm (Active)  ?Indication for Insertion or Continuance of Line Administration of hyperosmolar/irritating solutions (i.e. TPN, Vancomycin, etc.) 10/05/21 1700  ?Exposed Catheter (cm) 0 cm 10/05/21 1700  ?Site Assessment Clean, Dry, Intact 10/05/21 1700  ?Lumen #1 Status Flushed;Blood return noted 10/05/21 1700  ?Lumen #2 Status Flushed;Blood return noted 10/05/21 1700  ?Dressing Type Securing device;Transparent 10/05/21 1700  ?Dressing Status Clean, Dry, Intact;Antimicrobial disc in place 10/05/21 1700  ?Dressing Change Due 10/12/21 10/05/21 1700  ? ? ? ? ? ?Jule Economy Horton ?10/05/2021, 5:34 PM ? ?

## 2021-10-05 NOTE — Progress Notes (Signed)
?   10/05/21 1806  ?Assess: MEWS Score  ?Temp 98.5 ?F (36.9 ?C)  ?BP 107/81  ?Pulse Rate 92  ?Resp (!) 28  ?SpO2 100 %  ?O2 Device Room Air  ?Assess: MEWS Score  ?MEWS Temp 0  ?MEWS Systolic 0  ?MEWS Pulse 0  ?MEWS RR 2  ?MEWS LOC 0  ?MEWS Score 2  ?MEWS Score Color Yellow  ?Assess: if the MEWS score is Yellow or Red  ?Were vital signs taken at a resting state? Yes  ?Focused Assessment No change from prior assessment  ?Does the patient meet 2 or more of the SIRS criteria? Yes  ?Does the patient have a confirmed or suspected source of infection? No  ?MEWS guidelines implemented *See Row Information* Yes  ?Take Vital Signs  ?Increase Vital Sign Frequency  Yellow: Q 2hr X 2 then Q 4hr X 2, if remains yellow, continue Q 4hrs  ?Escalate  ?MEWS: Escalate Yellow: discuss with charge nurse/RN and consider discussing with provider and RRT  ?Notify: Charge Nurse/RN  ?Name of Charge Nurse/RN Notified Pamala Hurry  ?Date Charge Nurse/RN Notified 10/05/21  ?Time Charge Nurse/RN Notified 1836  ?Assess: SIRS CRITERIA  ?SIRS Temperature  0  ?SIRS Pulse 1  ?SIRS Respirations  1  ?SIRS WBC 0  ?SIRS Score Sum  2  ? ? ?

## 2021-10-05 NOTE — Progress Notes (Signed)
Calorie Count Note ? ?48 hour calorie count ordered. ? ?Diet: 5/11: FLD ?5/12 at dinner: Dysphagia 3 ? ?Supplements:  ?-Ensure Plus High Protein po TID, each supplement provides 350 kcal and 20 grams of protein.  ?-Juven BID, each serving provides 95kcal and 2.5g of protein ? ?Day 1: ?Breakfast 5/12: 0% ?Lunch 5/12: insubstantial amount ?Dinner 5/11: 50 kcals, 1g protein ?Supplements: 25% of Ensure Plus, 85 kcals, 5g protein ? ?Total intake: ?135 kcal (6% of minimum estimated needs)  ?6g protein (6% of minimum estimated needs) ? ?Day 2: ?Breakfast 5/13: 0% ?Lunch 5/13: 0% ?Dinner 5/12: 0% -65 kcals from a [employer] ?Supplements: Not documented ? ?Total intake: ?65 kcal (3% of minimum estimated needs)  ?0 protein (0% of minimum estimated needs) ? ?5/15: Patient consumed 1 bite of cream of wheat this AM. Not accepting supplements enough to warrant decreasing TPN. ? ?Nutrition Dx: Severe Malnutrition related to chronic illness as evidenced by severe fat depletion, severe muscle depletion ? ?Goal: Pt to meet >/= 90% of their estimated nutrition needs  ? ?Intervention:  ?-Continue TPN providing 100% of nutrition needs ?-Continue Ensure and Juven ?-Will trial Boost Breeze supplements ?-Continue MVI and calcium ? ?Clayton Bibles, MS, RD, LDN ?Inpatient Clinical Dietitian ?Contact information available via Amion ?  ?

## 2021-10-05 NOTE — Progress Notes (Signed)
Physical Therapy Treatment ?Patient Details ?Name: Terri [name] ?MRN: [id number] ?DOB: 1975/11/03 ?Today's Date: 10/05/2021 ? ? ?History of Present Illness 46 yo female with pmh homelessness, recurrent gib, chronic microcytic anemia, htn, polysubstance abuse, roux en y presented to [facility] 08/31/21 with reports of dark colored stools. 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 ? ?  ?PT Comments  ? ? General Comments: Pt AxO x 2 requiring Max encouragement to participate.  Visibly deconditioned and under weight. Assisted OOB was difficult. ?General bed mobility comments: required Max encouragement and Max/Total Assist to transiiton from supine to EOB.  Quick to say "I can't" and "I need to lay down".  Once upright, pt was able to static sit but at [place] in a collapsed posture with head downward.  Briefy able to straighten up.  Fatigues quickly. General transfer comment: + 2 side by side Max/Total Assist to rise from elevated bed.  Pt briefly able to stand partially upright but again with poor collasped posture and downward head.  Move to recliner via [place]. Positioned in recliner with multiple pillows.  Rec nursing use Maxi Move to asisst back to bed. ?Pt will need ST Rehab at SNF to address her mobility decline.    ?Recommendations for follow up therapy are one component of a multi-disciplinary discharge planning process, led by the attending physician.  Recommendations may be updated based on patient status, additional functional criteria and insurance authorization. ? ?Follow Up Recommendations ? Skilled nursing-short term rehab (<3 hours/day) ?  ?  ?Assistance Recommended at Discharge Frequent or constant Supervision/Assistance  ?Patient can return home with the following Two people to help with walking and/or transfers;Assistance with cooking/housework;Direct supervision/assist for medications management;Assist for transportation;Two people to help with  bathing/dressing/bathroom;Assistance with feeding;Help with stairs or ramp for entrance ?  ?Equipment Recommendations ? None recommended by PT  ?  ?Recommendations for Other Services   ? ? ?  ?Precautions / Restrictions Precautions ?Precautions: Fall ?Precaution Comments: flexiseal ?Restrictions ?Weight Bearing Restrictions: No  ?  ? ?Mobility ? Bed Mobility ?Overal bed mobility: Needs Assistance ?Bed Mobility: Supine to Sit ?  ?  ?Supine to sit: Max assist, Total assist ?  ?  ?General bed mobility comments: required Max encouragement and Max/Total Assist to transiiton from supine to EOB.  Quick to say "I can't" and "I need to lay down".  Once upright, pt was able to static sit but at [place]  in a collapsed posture with head downward.  Briefy able to straighten up.  Fatigues quickly. ?  ? ?Transfers ?Overall transfer level: Needs assistance ?  ?Transfers: Bed to chair/wheelchair/BSC ?  ?  ?  ?  ?  ?  ?General transfer comment: + 2 side by side Max/Total Assist to rise from elevated bed.  Pt briefly able to stand partially upright but again with poor collasped posture and downward head.  Move to recliner via [place]. ?Transfer via Lift Equipment: [place] ? ?Ambulation/Gait ?  ?  ?  ?  ?  ?  ?  ?General Gait Details: unable at this time ? ? ?Stairs ?  ?  ?  ?  ?  ? ? ?Wheelchair Mobility ?  ? ?Modified Rankin (Stroke Patients Only) ?  ? ? ?  ?Balance   ?  ?  ?  ?  ?  ?  ?  ?  ?  ?  ?  ?  ?  ?  ?  ?  ?  ?  ?  ? ?  ?  Cognition Arousal/Alertness: Awake/alert ?Behavior During Therapy: Flat affect ?Overall Cognitive Status: No family/caregiver present to determine baseline cognitive functioning ?Area of Impairment: Orientation, Attention, Following commands, Awareness ?  ?  ?  ?  ?  ?  ?  ?  ?Orientation Level: Time, Situation ?Current Attention Level: Focused ?  ?Following Commands: Follows one step commands inconsistently ?  ?Awareness: Emergent ?  ?General Comments: Pt AxO x 2 requiring Max encouragement to participate.   Visibly deconditioned and under weight. ?  ?  ? ?  ?Exercises   ? ?  ?General Comments   ?  ?  ? ?Pertinent Vitals/Pain Pain Assessment ?Pain Assessment: No/denies pain ?Pain Location: general  ? ? ?Home Living   ?  ?  ?  ?  ?  ?  ?  ?  ?  ?   ?  ?Prior Function    ?  ?  ?   ? ?PT Goals (current goals can now be found in the care plan section)   ? ?  ?Frequency ? ? ? Min 2X/week ? ? ? ?  ?PT Plan Current plan remains appropriate  ? ? ?Co-evaluation   ?  ?  ?  ?  ? ?  ?AM-PAC PT "6 Clicks" Mobility   ?Outcome Measure ? Help needed turning from your back to your side while in a flat bed without using bedrails?: A Lot ?Help needed moving from lying on your back to sitting on the side of a flat bed without using bedrails?: A Lot ?Help needed moving to and from a bed to a chair (including a wheelchair)?: Total ?Help needed standing up from a chair using your arms (e.g., wheelchair or bedside chair)?: Total ?Help needed to walk in hospital room?: Total ?Help needed climbing 3-5 steps with a railing? : Total ?6 Click Score: 8 ? ?  ?End of Session Equipment Utilized During Treatment: Oxygen;Gait belt ?Activity Tolerance: Patient limited by fatigue ?Patient left: in chair;with call bell/phone within reach;with chair alarm set ?Nurse Communication: Mobility status;Need for lift equipment ?PT Visit Diagnosis: Muscle weakness (generalized) (M62.81);Difficulty in walking, not elsewhere classified (R26.2);Pain;Adult, failure to thrive (R62.7) ?  ? ? ?Time: 5176-1607 ?PT Time Calculation (min) (ACUTE ONLY): 33 min ? ?Charges:  $Therapeutic Activity: 23-37 mins          ?          ? ?Rica Koyanagi  PTA ?Acute  Rehabilitation Services ?Pager      [phone] ?Office      [phone] ? ?

## 2021-10-05 NOTE — Progress Notes (Signed)
SLP Cancellation Note ? ?Patient Details ?Name: Terri [name] ?MRN: [id number] ?DOB: [birth year] ? ? ?Cancelled treatment:       Reason Eval/Treat Not Completed: Patient declined, no reason specified. Patient in bed with lunch meal tray in front of her which has not been touched. SLP stood at doorway and talked to patient briefly. She politely declined any PO's and reported that she did not eat any breakfast. SLP will continue efforts to f/u with patient regarding dysphagia; recommend continue with Dys 3, thin diet. ? ? ?Sonia Baller, MA, CCC-SLP ?Speech Therapy ? ?

## 2021-10-05 NOTE — Progress Notes (Signed)
?   ? ?[facility] for Infectious Disease ? ?Date of Admission:  08/31/2021    ?       ?Reason for visit: Follow up on disseminated staph infection ? ?Current antibiotics: ?Unasyn ? ?ASSESSMENT:   ? ?46 y.o. female admitted with: ? ?Staph aureus pneumonia: She completed 10 days of MRSA coverage with vancomycin --> ceftaroline (changed due to issues with supratherapeutic Vanco levels). ?Hepatic abscess: Status post aspiration 4/17.  Cultures grew MSSA and Bacteroides.  Currently covered with Unasyn. 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  OR cultures grew MRSA. ?Back pain: Noted to have worsening pain in the setting of sacral decubitus and pressure ulcers.  Repeat MRI last week was stable with no evidence of discitis/osteomyelitis. ?Hypoxic respiratory failure: Stable on nasal cannula. ?Candida esophagitis: Completed treatment. ?Protein calorie malnutrition: Albumin is 2.0. ? ?RECOMMENDATIONS:   ? ?Continue Unasyn ?Primary team will reach out to cardiology to see if she is stable for TEE to rule in or rule out endocarditis ?This will determine final duration of therapy ?Wound care, glycemic control, nutrition ?Lab monitoring ?Will follow ? ? ?Principal Problem: ?  Disseminated MSSA with liver abscess, left foot osteo and abscess, 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 ?Active Problems: ?  Opioid abuse with opioid-induced mood disorder ([place]) ?  Leucocytosis ?  Polysubstance abuse ([place]) ?  Acute on chronic blood loss anemia ?  Multilobar pneumonia with acute hypoxic respiratory failure ([place]) ?  Portal vein thrombosis ?  Candida infection, esophageal ([place]) ?  Hepatic abscess ?  Swelling of left hand ?  Ascites ?  Exudative pleural effusion ?  Hypophosphatemia ?  Vaginal discharge ?  Acute respiratory failure with hypoxia ([place]) ?  Involuntary commitment ?  Protein-calorie malnutrition, severe ?  Goals of care, counseling/discussion ? ? ? ?MEDICATIONS:   ? ?Scheduled Meds: ? amLODipine  10 mg Oral Daily  ?  calcium carbonate  1 tablet Oral TID  ? chlorhexidine  15 mL Mouth Rinse BID  ? Chlorhexidine Gluconate Cloth  6 each Topical Daily  ? cholecalciferol  1,000 Units Oral Daily  ? cloNIDine  0.1 mg Transdermal Weekly  ? dronabinol  5 mg Oral BID AC  ? feeding supplement  237 mL Oral TID BM  ? folic acid  1 mg Oral Daily  ? guaiFENesin  5 mL Oral Q6H  ? heparin injection (subcutaneous)  5,000 Units Subcutaneous Q8H  ? hydrALAZINE  50 mg Oral BID  ? levalbuterol  0.63 mg Nebulization TID  ? lidocaine  1 patch Transdermal Daily  ? lisinopril  10 mg Oral QHS  ? mouth rinse  15 mL Mouth Rinse q12n4p  ? multivitamin with minerals  1 tablet Oral Daily  ? nicotine  21 mg Transdermal Daily  ? nutrition supplement (JUVEN)  1 packet Oral BID BM  ? pantoprazole  40 mg Oral BID  ? sodium chloride flush  10-40 mL Intracatheter Q12H  ? sucralfate  1 g Oral TID WC & HS  ? thiamine  100 mg Oral Daily  ? ?Continuous Infusions: ? sodium chloride 10 mL/hr at 09/30/21 0800  ? sodium chloride Stopped (09/30/21 1352)  ? ampicillin-sulbactam (UNASYN) IV 3 g (10/05/21 0740)  ? TPN ADULT (ION) 90 mL/hr at 10/04/21 1734  ? TPN ADULT (ION)    ? ?PRN Meds:.sodium chloride, acetaminophen (TYLENOL) oral liquid 160 mg/5 mL **OR** acetaminophen, dextrose, fentaNYL (SUBLIMAZE) injection, guaiFENesin-dextromethorphan, haloperidol lactate **AND** [DISCONTINUED] LORazepam, hydrALAZINE, HYDROcodone-acetaminophen, ipratropium-albuterol, lip balm, loperamide HCl, naLOXone (NARCAN)  injection, ondansetron (ZOFRAN) IV, saline, sodium chloride flush ? ?SUBJECTIVE:  ? ?24 hour events:  ?No significant events noted overnight ?Afebrile, Tmax 98.2 ?WBC 2.9, hemoglobin 7, platelets 107 ?Reports back pain is stable.  Reports pain in her foot. ? ?Review of Systems  ?All other systems reviewed and are negative. ? ?  ?OBJECTIVE:  ? ?Blood pressure 130/85, pulse 76, temperature 98.1 ?F (36.7 ?C), temperature source Oral, resp. rate (!) 26, height '5\' 9"'$  (1.753 m), weight  49.4 kg, SpO2 100 %. ?Body mass index is 16.08 kg/m?. ? ?Physical Exam ?Constitutional:   ?   Comments: Thin, frail-appearing woman, lying in bed, no acute distress.  ?HENT:  ?   Head: Normocephalic and atraumatic.  ?Eyes:  ?   Extraocular Movements: Extraocular movements intact.  ?   Conjunctiva/sclera: Conjunctivae normal.  ?Pulmonary:  ?   Effort: Pulmonary effort is normal. No respiratory distress.  ?   Comments: On nasal cannula. ?Abdominal:  ?   General: There is no distension.  ?   Palpations: Abdomen is soft.  ?   Tenderness: There is no abdominal tenderness.  ?Musculoskeletal:     ?   General: Normal range of motion.  ?   Cervical back: Normal range of motion and neck supple.  ?   Comments: Status post left great toe amputation.  ?Skin: ?   General: Skin is warm and dry.  ?Neurological:  ?   General: No focal deficit present.  ?   Mental Status: She is oriented to person, place, and time.  ?Psychiatric:     ?   Mood and Affect: Mood normal.     ?   Behavior: Behavior normal.  ? ? ? ?Lab Results: ?Lab Results  ?Component Value Date  ? WBC 2.9 (L) 10/05/2021  ? HGB 7.0 (L) 10/05/2021  ? HCT 21.0 (L) 10/05/2021  ? MCV 81.1 10/05/2021  ? PLT 107 (L) 10/05/2021  ?  ?Lab Results  ?Component Value Date  ? NA 132 (L) 10/05/2021  ? K 4.3 10/05/2021  ? CO2 22 10/05/2021  ? GLUCOSE 76 10/05/2021  ? BUN 20 10/05/2021  ? CREATININE 0.34 (L) 10/05/2021  ? CALCIUM 7.9 (L) 10/05/2021  ? GFRNONAA >60 10/05/2021  ? GFRAA >60 07/15/2019  ?  ?Lab Results  ?Component Value Date  ? ALT 23 10/05/2021  ? AST 26 10/05/2021  ? ALKPHOS 115 10/05/2021  ? BILITOT 0.6 10/05/2021  ? ? ?   ?Component Value Date/Time  ? CRP 1.2 (H) 05/29/2021 0346  ? ? ?   ?Component Value Date/Time  ? ESRSEDRATE 20 02/26/2014 0958  ? ?  ?I have reviewed the micro and lab results in Epic. ? ?Imaging: ?No results found.  ? ?Imaging independently reviewed in Epic.  ? ? ?Mignon Pine ?[facility] for Infectious Disease ?[place] ?[phone] pager ?10/05/2021, 11:14 AM ? ? ?

## 2021-10-05 NOTE — Progress Notes (Signed)
?PROGRESS NOTE ? ? ? ?Terri [name]  MIW:[id number] DOB: 01-26-1976 DOA: 08/31/2021 ?PCP: Pcp, No  ? ? ? ?Brief Narrative:  ?46 year old with past medical history significant for homelessness, recurrent GIB, chronic microcytic anemia, hypertension, polysubstance abuse, Roux n Y presented to [facility] complaining of dark color related stool.  Patient has history of chronic GI bleed secondary to peptic ulcers.  She underwent endoscopy with large anastomotic ulceration noted and is started on PPI.  Hemoglobin is stabilized. ?  ?She was subsequently found to have MSSA bacteremia that appears to be disseminated.  Her left great toe was noted to be gangrenous, vascular surgery and orthopedics determined amputation will be best route for patient and this was done on 4/26.  Culture from great toe grew with MRSA and patient was broadened to vancomycin.  She was found to have multiple liver abscess, and history of portal vein thrombosis. ?  ?During hospital course she started to require increased oxygen support, ultimately transferred to ICU and intubated. ? ? ?Subjective: ?5/15 afebrile overnight A/O x3 (does not know when) ? ?  ?Assessment & Plan: ?Covid vaccination; ?  ?Principal Problem: ?  Disseminated MSSA with liver abscess, left foot osteo and abscess, 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 ?Active Problems: ?  Multilobar pneumonia with acute hypoxic respiratory failure ([place]) ?  Hepatic abscess ?  Acute respiratory failure with hypoxia ([place]) ?  Acute on chronic blood loss anemia ?  Swelling of left hand ?  Portal vein thrombosis ?  Exudative pleural effusion ?  Involuntary commitment ?  Ascites ?  Vaginal discharge ?  Polysubstance abuse ([place]) ?  Hypophosphatemia ?  Candida infection, esophageal ([place]) ?  Opioid abuse with opioid-induced mood disorder ([place]) ?  Leucocytosis ?  Protein-calorie malnutrition, severe ?  Goals of care, counseling/discussion ? ?Acute respiratory failure with hypoxia and hypercapnia/positive MRSA  pneumonia ?-Intubated 09/20/2021  ?-5/5 Extubation ?-Treated  with Teflaro and IV Flagyl initially.  ?-Completed 10 days coverage MRSA PNA>  ?-Follow ID recommendation ? ?Polymicrobial liver abscess (MSSA hepatic abscess) ?-Now on Unasyn to cover Liver Abscess. Will also cover Bacteroids ?-She will need TEE when more stable, per ID.  ?-Per ID note 4/18 will need at least 6 weeks of antibiotics ?-5/13 discussed case with Dr. Jule Ser, ID who stated we should consider this as week 4 of antibiotic use.  Patient will need antibiotics for 2 more weeks. ?- 5/15 contacted Cardiology spoke with card master  will evaluate patient for TEE ? ?Disseminated MSSA infection, osteo LEFT great toe  ?-S/p status post amputation ? ?Left groin possible abscess versus mass; ?-5 cm mass left groin and Doppler X lower extremity 09/20/2021. ?-Not seen on nonvascular ultrasound of left groin 5/1 Will need follow-up ? ?Osteomyelitis of thoracic/lumbar spine? ?- 5/12 MRI thoracic/lumbar spine pending ? ?Bilateral pleural effusion, right>> left ?-4/23 s/p status post RIGHT thoracentesis; exudative based on pleural fluid study.  Culture negative. ?-5/7 CXR : Stable infiltrate in the lateral left lung base, small layering right effusion with underlying opacities stable to mildly more prominent.  There is new right perihilar opacity ? ?Volume overload, pulmonary edema acute ?-Received IV lasix and albumin 5/07 ?-Strict in and out -2.0 L ?- Daily weight ?Filed Weights  ? 09/30/21 0408 10/03/21 0614 10/04/21 0500  ?Weight: 52 kg 52 kg 49.4 kg  ?  ? ?Hypertensive urgency/Essential HTN ?-5/10 amlodipine 10 mg daily ?- 5/10 Catapres 0.1 mg transdermal ?-5/10 hydralazine 50 mg BID ?-5/10 hydralazine IV PRN ?-5/10 lisinopril  10 mg daily ?- 5/10 NTG drip goal SBP 110-130 ?-5/11 BP now at goal ?  ?  ?Acute encephalopathy  ?-Multifactorial sepsis, Chronic narcotic dependence. ?-Has been seen by psych and deemed not to have capacity to make medical  decisions ?-Resolved ?  ?History of portal vein thrombosis:  ?-No candidate for anticoagulation.  ?  ?Acute GI bleed //Acute blood loss anemia;  ?admitted with a hemoglobin of 4.6 superimposed on chronic microcytic anemia ?-Endoscopy: Large anastomotic Ulceration.  ?On IV PPI.  ?Lab Results  ?Component Value Date  ? HGB 7.0 (L) 10/05/2021  ? HGB 7.2 (L) 10/04/2021  ? HGB 7.7 (L) 10/03/2021  ? HGB 8.2 (L) 10/02/2021  ? HGB 8.6 (L) 10/01/2021  ?-Stable ?  ?Anemia ?-Anemia panel pending ?-5/15 transfuse 1 unit PRBC ? ?Thrombocytopenia:  ?-Likely related to infection ?-Improved ?  ?Hypokalemia: ?-Potassium goal> 4 ?- 5/11 potassium IV 70 mEq ? ?Hypocalcemia ?- Corrected calcium= 9.14 no need for repletion at this time continue to monitor. ?  ?Hypoglycemia:  ?-5/11 TPN at goal, DC D10 ?  ?Homelessness ?-Will need to be addressed by LCSW ? ?Polysubstance abuse ?-4/10 positive cocaine ?Psych was consulted and recommended IVC, may need to revisit capacity again prior to discharge if cooperation becomes an issue. ?-5/13 patient's sister and/or boyfriend may have brought cocaine in the hospital for patient to consume. ?-5/13 have requested telemetry sitter, to ensure no illegal substances are being brought into patient ? ?Vitamin D deficiency ?-On vitamin D supplements ? ?Severe protein caloric malnutrition and failure to thrive ?-She failed a swallow evaluation.  She will need core track for tube feedings ?-Started  IV thiamine ?-Core track unable to be advance by IR> will ask GI to see.  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  He will not be possible to place a tube in her stomach due to limited space.  She also has a large anastomotic ulcer which complicate things.  We can consider surgery consult for a J-tube placement due to her anatomy. ?-5/8 started TPN .  She is probably very weak to undergoing surgical procedure for J tube placement. She passed swallow evaluation,  started on full liquid diet. Hopefully we can discontinue TPN soon.  ?-5/11-calorie count pending ? ?Anorexia ?- 5/13 Marinol 5 mg BID ? ? ?Pressure Injury 09/24/21 Sacrum Medial Deep Tissue Pressure Injury - Purple or maroon localized area of discolored intact skin or blood-filled blister due to damage of underlying soft tissue from pressure and/or shear. several deep tissue injuries, dark  (Active)  ?09/24/21 1100  ?Location: Sacrum  ?Location Orientation: Medial  ?Staging: Deep Tissue Pressure Injury - Purple or maroon localized area of discolored intact skin or blood-filled blister due to damage of underlying soft tissue from pressure and/or shear.  ?Wound Description (Comments): several deep tissue injuries, dark purple, non-blanchable  ?Present on Admission: No  ?Dressing Type Foam - Lift dressing to assess site every shift 10/05/21 0730  ?   ?Pressure Injury 09/24/21 Buttocks Left;Posterior;Proximal Deep Tissue Pressure Injury - Purple or maroon localized area of discolored intact skin or blood-filled blister due to damage of underlying soft tissue from pressure and/or shear. several purple,  (Active)  ?09/24/21 1100  ?Location: Buttocks (gluteal fold)  ?Location Orientation: Left;Posterior;Proximal  ?Staging: Deep Tissue Pressure Injury - Purple or maroon localized area of discolored intact skin or blood-filled blister due to damage of underlying soft tissue from pressure and/or shear.  ?Wound Description (  Comments): several purple, non-blanchable  ?Present on Admission: No  ?Dressing Type Foam - Lift dressing to assess site every shift 10/05/21 0730  ?   ?Pressure Injury 09/24/21 Vertebral column Lower Deep Tissue Pressure Injury - Purple or maroon localized area of discolored intact skin or blood-filled blister due to damage of underlying soft tissue from pressure and/or shear. spinal column deep tissue, (Active)  ?09/24/21 1100  ?Location: Vertebral column  ?Location Orientation: Lower  ?Staging: Deep  Tissue Pressure Injury - Purple or maroon localized area of discolored intact skin or blood-filled blister due to damage of underlying soft tissue from pressure and/or shear.  ?Wound Description (Comments): spin

## 2021-10-05 NOTE — Plan of Care (Signed)
?  Problem: Education: ?Goal: Ability to identify signs and symptoms of gastrointestinal bleeding will improve ?Outcome: Progressing ?  ?Problem: Bowel/Gastric: ?Goal: Will show no signs and symptoms of gastrointestinal bleeding ?Outcome: Progressing ?  ?Problem: Clinical Measurements: ?Goal: Complications related to the disease process, condition or treatment will be avoided or minimized ?Outcome: Progressing ?  ?Problem: Health Behavior/Discharge Planning: ?Goal: Ability to manage health-related needs will improve ?Outcome: Progressing ?  ?Problem: Clinical Measurements: ?Goal: Ability to maintain clinical measurements within normal limits will improve ?Outcome: Progressing ?Goal: Will remain free from infection ?Outcome: Progressing ?Goal: Diagnostic test results will improve ?Outcome: Progressing ?Goal: Respiratory complications will improve ?Outcome: Progressing ?Goal: Cardiovascular complication will be avoided ?Outcome: Progressing ?  ?Problem: Activity: ?Goal: Risk for activity intolerance will decrease ?Outcome: Progressing ?  ?Problem: Nutrition: ?Goal: Adequate nutrition will be maintained ?Outcome: Progressing ?  ?Problem: Coping: ?Goal: Level of anxiety will decrease ?Outcome: Progressing ?  ?Problem: Elimination: ?Goal: Will not experience complications related to bowel motility ?Outcome: Progressing ?Goal: Will not experience complications related to urinary retention ?Outcome: Progressing ?  ?Problem: Pain Managment: ?Goal: General experience of comfort will improve ?Outcome: Progressing ?  ?Problem: Safety: ?Goal: Ability to remain free from injury will improve ?Outcome: Progressing ?  ?Problem: Skin Integrity: ?Goal: Risk for impaired skin integrity will decrease ?Outcome: Progressing ?  ?Problem: Safety: ?Goal: Non-violent Restraint(s) ?Outcome: Progressing ?  ?

## 2021-10-05 NOTE — Progress Notes (Signed)
Order placed for IV team to collect type and screen from central line.  ?

## 2021-10-05 NOTE — Progress Notes (Signed)
Speech Language Pathology Treatment: Dysphagia  ?Patient Details ?Name: Terri [name] ?MRN: [id number] ?DOB: 28-Nov-1975 ?Today's Date: 10/05/2021 ?Time: 1420-1440 ?SLP Time Calculation (min) (ACUTE ONLY): 20 min ? ?Assessment / Plan / Recommendation ?Clinical Impression ? Patient seen by SLP for skilled treatment session focused on dysphagia goals. When SLP arrived in room, RN feeding patient some lunch (cut up chicken tenders and diced potatoes. Per RN, patient declined the potatoes but was eating some of the chicken tenders with bbq dipping sauce. Patient continues to exhibited prolonged mastication and bolus formation with solids. SLP also observed patient to spit out some very small pieces of chicken. She did not exhibit any overt s/s of pharyngeal phase dysphagia and no change in breathing during or after PO intake. When SLP was then going to assist patient with PO intake, she reported she was done eating. SLP left room, spoke with RN and returned but patient still refusing. SLP requested and patient did demonstrate ability to pick up and hold cup to take sips as well as to pick up fork and bring to mouth. Patient continues with very poor PO intake. RN did report that when she was helping patient order food, patient requested pizza but her current diet restrictions (Dys 3) prevent her from being able to order this. SLP will continue to follow patient for dysphagia goals and will consider trials of upgraded solids textures if it might help with overall PO intake. ?  ?HPI HPI: Gastroenterology was consulted, EGD on 09/03/2021 showed esophageal plaques consistent with candidiasis, large cratered nonbleeding duodenal ulcer with no stigmata of bleeding.  bx 4/17 showed acute inflammation with necrosis compatible with abscess, granulomatous inflammation with eosinophilia and fragment of foreign material, negative for malignancy;  CXR Grossly stable right upper lobe pneumonia. Stable mild left basilar atelectasis or  infiltrate., incr ox needs thus TEE deferred, 4/23  thora and s/p paracentesis 4/17; ? Endocarditis; cocaine +. Bedside swallow evaluation completed by SLP on 09/15/21 and patient's swallow at that time appeared largely [facility] and no f/u recommended. Patient had to be intubated on 4/30 secondary to tachypneic with accessory muscle use and increased WOB. She was extubated on 09/25/21 and has been NPO awaiting SLP swallow evaluation. MBS completed on 09/29/21 showing moderate oral phase dysphagia but pharyngeal and cervical phase swallow both WFL and patient started on Dys 3 (mechanical soft) solids, thin liquids. ?  ?   ?SLP Plan ? Continue with current plan of care ? ?  ?  ?Recommendations for follow up therapy are one component of a multi-disciplinary discharge planning process, led by the attending physician.  Recommendations may be updated based on patient status, additional functional criteria and insurance authorization. ?  ? ?Recommendations  ?Diet recommendations: Dysphagia 3 (mechanical soft);Thin liquid ?Liquids provided via: Cup;Straw ?Medication Administration: Whole meds with puree ?Supervision: Patient able to self feed;Staff to assist with self feeding ?Compensations: Minimize environmental distractions  ?   ?    ?   ? ? ? ? Oral Care Recommendations: Oral care BID ?Follow Up Recommendations: Skilled nursing-short term rehab (<3 hours/day) ?Assistance recommended at discharge: Frequent or constant Supervision/Assistance ?SLP Visit Diagnosis: Dysphagia, pharyngoesophageal phase (R13.14) ?Plan: Continue with current plan of care ? ? ? ? ?  ?  ? ?Sonia Baller, MA, CCC-SLP ?Speech Therapy ? ?

## 2021-10-05 NOTE — Progress Notes (Addendum)
PHARMACY - TOTAL PARENTERAL NUTRITION CONSULT NOTE  ? ?Indication:  intolerance to enteral feeding ? ?Patient Measurements: ?Height: '5\' 9"'$  (175.3 cm) ?Weight: 49.4 kg (108 lb 14.5 oz) ?IBW/kg (Calculated) : 66.2 ?TPN AdjBW (KG): 66.7 ?Body mass index is 16.08 kg/m?. ?Usual Weight:  ? ?Assessment: 32 yoF admitted on 08/31/21 with bloody stools.  PMH significant for homelessness, recurrent GIB, chronic microcytic anemia, hypertension, polysubstance abuse, Roux n Y gastric bypass.  She is s/p endoscopy showing large anastomotic ulceration and is being treated with PPI.  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  ?Dobhoff feeding tube placed, required removal d/t unable to advance to correct position.  Pharmacy is now consulted to dose TPN.   ? ?Glucose / Insulin: CBGs all < 150,  DC'd  CBGs 5/12 ?Electrolytes: Na slightly low at 132, Mg 1.7 (low-end of normal), other lytes WNL ?Renal: SCr 0.34, BUN 20 (stable) ?Hepatic: LFTs and Tbili WNL, Albumin 2, TG 25 ?Intake / Output; MIVF: +270 mL PO intake, +1.9L IV intake ?- Stool 650/24 hrs, UOP none documented on 5/14 ?GI Imaging: ?GI Surgeries / Procedures:  ? ?Central access: PICC ?TPN start date: 5/9  ? ?Nutritional Goals: ?Goal TPN rate is 90 mL/hr (provides 114 g of protein and 2281 kcals per day) ? ?RD Assessment: (5/9) ?Estimated Needs ?Total Energy Estimated Needs: 2150-2350 kcal ?Total Protein Estimated Needs: 100-115 grams ?Total Fluid Estimated Needs: >/= 2.2 L/day ? ?Current Nutrition:  ?TPN ?Dys 3 diet - documenting eating ~15% of meals ?Ensure plus TID - refused 2 of 3 doses 5/14 ?Juven bid - refusing all doses thus far  ?5/13 Marinol added ?48 hour calorie count completed - minimal PO intake per discussion with RN ? ?Plan:  ?Continue TPN @ 90 ml/hr - goal rate provides 100% goals ?Electrolytes in TPN:  ?Na 152mq/L ?K 537m/L ?Ca 37m76mL ?Mg 7mE79m ?Phos 137mm69m ?Cl:Ac 1:2 ?Oral MVI,  thiamine, folic acid, calcium, vit D, PPI ?Monitor TPN labs on Mon/Thurs ?F/u BMET, Mg, and Phos 5/16 ?On Dys 3 diet - f/u wean off TPN as indicated ? ?Mary-Dimple NanasrmD ?10/05/2021 7:07 AM ? ?

## 2021-10-06 ENCOUNTER — Inpatient Hospital Stay (HOSPITAL_COMMUNITY): Payer: Medicaid Other

## 2021-10-06 DIAGNOSIS — I38 Endocarditis, valve unspecified: Secondary | ICD-10-CM | POA: Diagnosis not present

## 2021-10-06 DIAGNOSIS — I96 Gangrene, not elsewhere classified: Secondary | ICD-10-CM | POA: Diagnosis not present

## 2021-10-06 DIAGNOSIS — Z765 Malingerer [conscious simulation]: Secondary | ICD-10-CM

## 2021-10-06 DIAGNOSIS — B3781 Candidal esophagitis: Secondary | ICD-10-CM | POA: Diagnosis not present

## 2021-10-06 DIAGNOSIS — K750 Abscess of liver: Secondary | ICD-10-CM | POA: Diagnosis not present

## 2021-10-06 DIAGNOSIS — J181 Lobar pneumonia, unspecified organism: Secondary | ICD-10-CM | POA: Diagnosis not present

## 2021-10-06 LAB — CBC WITH DIFFERENTIAL/PLATELET
Abs Immature Granulocytes: 0.01 10*3/uL (ref 0.00–0.07)
Basophils Absolute: 0 10*3/uL (ref 0.0–0.1)
Basophils Relative: 1 %
Eosinophils Absolute: 0.1 10*3/uL (ref 0.0–0.5)
Eosinophils Relative: 2 %
HCT: 24.5 % — ABNORMAL LOW (ref 36.0–46.0)
Hemoglobin: 8.1 g/dL — ABNORMAL LOW (ref 12.0–15.0)
Immature Granulocytes: 0 %
Lymphocytes Relative: 24 %
Lymphs Abs: 0.9 10*3/uL (ref 0.7–4.0)
MCH: 26.1 pg (ref 26.0–34.0)
MCHC: 33.1 g/dL (ref 30.0–36.0)
MCV: 79 fL — ABNORMAL LOW (ref 80.0–100.0)
Monocytes Absolute: 0.4 10*3/uL (ref 0.1–1.0)
Monocytes Relative: 12 %
Neutro Abs: 2.3 10*3/uL (ref 1.7–7.7)
Neutrophils Relative %: 61 %
Platelets: 128 10*3/uL — ABNORMAL LOW (ref 150–400)
RBC: 3.1 MIL/uL — ABNORMAL LOW (ref 3.87–5.11)
RDW: 24.1 % — ABNORMAL HIGH (ref 11.5–15.5)
WBC: 3.7 10*3/uL — ABNORMAL LOW (ref 4.0–10.5)
nRBC: 0 % (ref 0.0–0.2)

## 2021-10-06 LAB — ECHOCARDIOGRAM COMPLETE
AR max vel: 2.02 cm2
AV Area VTI: 1.86 cm2
AV Area mean vel: 2.09 cm2
AV Mean grad: 4 mmHg
AV Peak grad: 6 mmHg
Ao pk vel: 1.22 m/s
Area-P 1/2: 7.44 cm2
Calc EF: 48.9 %
Height: 69 in
P 1/2 time: 637 msec
S' Lateral: 3.3 cm
Single Plane A2C EF: 46.3 %
Single Plane A4C EF: 51.4 %
Weight: 1802.48 oz

## 2021-10-06 LAB — BPAM RBC
Blood Product Expiration Date: 202306082359
ISSUE DATE / TIME: 202305151754
Unit Type and Rh: 7300

## 2021-10-06 LAB — BASIC METABOLIC PANEL
Anion gap: 5 (ref 5–15)
BUN: 25 mg/dL — ABNORMAL HIGH (ref 6–20)
CO2: 24 mmol/L (ref 22–32)
Calcium: 7.9 mg/dL — ABNORMAL LOW (ref 8.9–10.3)
Chloride: 106 mmol/L (ref 98–111)
Creatinine, Ser: 0.41 mg/dL — ABNORMAL LOW (ref 0.44–1.00)
GFR, Estimated: >60 mL/min (ref >60)
Glucose, Bld: 88 mg/dL (ref 70–99)
Potassium: 4.4 mmol/L (ref 3.5–5.1)
Sodium: 135 mmol/L (ref 135–145)

## 2021-10-06 LAB — TYPE AND SCREEN
ABO/RH(D): B POS
Antibody Screen: NEGATIVE
Unit division: 0

## 2021-10-06 LAB — MAGNESIUM: Magnesium: 2 mg/dL (ref 1.7–2.4)

## 2021-10-06 LAB — PHOSPHORUS: Phosphorus: 4.7 mg/dL — ABNORMAL HIGH (ref 2.5–4.6)

## 2021-10-06 MED ORDER — IPRATROPIUM-ALBUTEROL 0.5-2.5 (3) MG/3ML IN SOLN
3.0000 mL | Freq: Two times a day (BID) | RESPIRATORY_TRACT | Status: DC
  Administered 2021-10-07 – 2021-10-08 (×4): 3 mL via RESPIRATORY_TRACT
  Filled 2021-10-06 (×5): qty 3

## 2021-10-06 MED ORDER — IPRATROPIUM-ALBUTEROL 0.5-2.5 (3) MG/3ML IN SOLN: 3.0000 mL | Freq: Two times a day (BID) | RESPIRATORY_TRACT | Status: DC

## 2021-10-06 MED ORDER — TRAVASOL 10 % IV SOLN
INTRAVENOUS | Status: DC
  Filled 2021-10-06: qty 1144.8

## 2021-10-06 MED ORDER — IPRATROPIUM-ALBUTEROL 0.5-2.5 (3) MG/3ML IN SOLN
3.0000 mL | Freq: Four times a day (QID) | RESPIRATORY_TRACT | Status: DC | PRN
  Administered 2021-10-07: 3 mL via RESPIRATORY_TRACT
  Filled 2021-10-06: qty 3

## 2021-10-06 NOTE — Plan of Care (Signed)
?  Problem: Education: ?Goal: Ability to identify signs and symptoms of gastrointestinal bleeding will improve ?Outcome: Progressing ?  ?Problem: Bowel/Gastric: ?Goal: Will show no signs and symptoms of gastrointestinal bleeding ?Outcome: Progressing ?  ?Problem: Clinical Measurements: ?Goal: Complications related to the disease process, condition or treatment will be avoided or minimized ?Outcome: Progressing ?  ?Problem: Health Behavior/Discharge Planning: ?Goal: Ability to manage health-related needs will improve ?Outcome: Progressing ?  ?Problem: Clinical Measurements: ?Goal: Ability to maintain clinical measurements within normal limits will improve ?Outcome: Progressing ?Goal: Will remain free from infection ?Outcome: Progressing ?Goal: Diagnostic test results will improve ?Outcome: Progressing ?Goal: Respiratory complications will improve ?Outcome: Progressing ?Goal: Cardiovascular complication will be avoided ?Outcome: Progressing ?  ?Problem: Activity: ?Goal: Risk for activity intolerance will decrease ?Outcome: Progressing ?  ?Problem: Nutrition: ?Goal: Adequate nutrition will be maintained ?Outcome: Progressing ?  ?Problem: Coping: ?Goal: Level of anxiety will decrease ?Outcome: Progressing ?  ?Problem: Elimination: ?Goal: Will not experience complications related to bowel motility ?Outcome: Progressing ?Goal: Will not experience complications related to urinary retention ?Outcome: Progressing ?  ?Problem: Pain Managment: ?Goal: General experience of comfort will improve ?Outcome: Progressing ?  ?Problem: Safety: ?Goal: Ability to remain free from injury will improve ?Outcome: Progressing ?  ?Problem: Skin Integrity: ?Goal: Risk for impaired skin integrity will decrease ?Outcome: Progressing ?  ?Problem: Safety: ?Goal: Non-violent Restraint(s) ?Outcome: Progressing ?  ?

## 2021-10-06 NOTE — Progress Notes (Signed)
PHARMACY - TOTAL PARENTERAL NUTRITION CONSULT NOTE  ? ?Indication:  intolerance to enteral feeding ? ?Patient Measurements: ?Height: '5\' 9"'$  (175.3 cm) ?Weight: 51.1 kg (112 lb 10.5 oz) ?IBW/kg (Calculated) : 66.2 ?TPN AdjBW (KG): 66.7 ?Body mass index is 16.64 kg/m?. ?Usual Weight:  ? ?Assessment: 18 yoF admitted on 08/31/21 with bloody stools.  PMH significant for homelessness, recurrent GIB, chronic microcytic anemia, hypertension, polysubstance abuse, Roux n Y gastric bypass.  She is s/p endoscopy showing large anastomotic ulceration and is being treated with PPI.  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  ?Dobhoff feeding tube placed, required removal d/t unable to advance to correct position.  Pharmacy is now consulted to dose TPN.   ? ?48-hour calorie count completed on 5/15:  ?- 5/12: total intake 135 kcal (6% of estimated needs), 6g protein (6% of estimated needs) ?- 5/13: total intake 65 kcal (3% of estimated needs), 0g protein ? ?Glucose / Insulin: CBGs all < 150,  DC'd  CBGs 5/12 ?Electrolytes: Phos slightly elev at 4.7, all other lytes WNL ?Renal: SCr 0.41 (increasing), BUN 25 (increasing) ?Hepatic: 5/15: LFTs and Tbili WNL, Albumin 2, TG 25 ?Intake / Output; MIVF: +90 mL PO intake, +2.4L IV intake ?- Stool none documented 5/15, UOP 1.8L/24h  ?GI Imaging: ?GI Surgeries / Procedures:  ? ?Central access: PICC ?TPN start date: 5/9  ? ?Nutritional Goals: ?Goal TPN rate is 90 mL/hr (provides 114 g of protein and 2281 kcals per day) ? ?RD Assessment: (5/9) ?Estimated Needs ?Total Energy Estimated Needs: 2150-2350 kcal ?Total Protein Estimated Needs: 100-115 grams ?Total Fluid Estimated Needs: >/= 2.2 L/day ? ?Current Nutrition:  ?TPN to provide 100% of needs ?Dys 3 diet - minimal to no intake. Documented eating one chicken tender and one ice cream cup. ?Ensure plus TID - refused 2 of 3 doses 5/15 ?Juven bid - refusing all doses thus far   ?5/13 Marinol added ? ?Plan:  ?Continue TPN @ 90 ml/hr - goal rate provides 100% goals ?Electrolytes in TPN:  ?Na 147mq/L ?K 522m/L ?Ca 36m46mL ?Mg 7mE51m ?Phos 12mm72m ?Cl:Ac 1:2 ?Oral MVI, thiamine, folic acid, calcium, vit D, PPI ?Monitor TPN labs on Mon/Thurs ?On Dys 3 diet ?Follow-up long-term nutrition plan ? ?Mary-Dimple NanasrmD ?10/06/2021 8:28 AM ? ?

## 2021-10-06 NOTE — Progress Notes (Signed)
?  Echocardiogram ?2D Echocardiogram has been performed. ? ?Terri [name]  Romeo Rabon ?10/06/2021, 2:48 PM ?

## 2021-10-06 NOTE — TOC Progression Note (Signed)
Transition of Care (TOC) - Progression Note  ? ? ?Patient Details  ?Name: DOTTIE VAQUERANO ?MRN: [id number] ?Date of Birth: 1976/02/14 ? ?Transition of Care (TOC) CM/SW Contact  ?Ross Ludwig, LCSW ?Phone Number: ?10/06/2021, 3:05 PM ? ?Clinical Narrative:    ? ?TOC leadership following patient as a DTP for SNF.  Patient still not medically ready for discharge yet per physician. ? ? ?Expected Discharge Plan: Home/Self Care ?Barriers to Discharge: Continued Medical Work up ? ?Expected Discharge Plan and Services ?Expected Discharge Plan: Home/Self Care ?  ?Discharge Planning Services: CM Consult ?  ?Living arrangements for the past 2 months: No permanent address ?                ?  ?  ?  ?  ?  ?  ?  ?  ?  ?  ? ? ?Social Determinants of Health (SDOH) Interventions ?  ? ?Readmission Risk Interventions ? ?  09/02/2021  ?  2:25 PM  ?Readmission Risk Prevention Plan  ?Transportation Screening Complete  ?Medication Review Press photographer) Complete  ?PCP or Specialist appointment within 3-5 days of discharge Complete  ?Palliative Care Screening Not Applicable  ?[place] Not Applicable  ? ? ?

## 2021-10-06 NOTE — Progress Notes (Signed)
?PROGRESS NOTE ? ? ? ?Terri [name]  BWL:[id number] DOB: Jul 22, 1975 DOA: 08/31/2021 ?PCP: Pcp, No  ? ? ? ?Brief Narrative:  ?46 year old with past medical history significant for homelessness, recurrent GIB, chronic microcytic anemia, hypertension, polysubstance abuse, Roux n Y presented to [facility] long hospital complaining of dark color related stool.  Patient has history of chronic GI bleed secondary to peptic ulcers.  She underwent endoscopy with large anastomotic ulceration noted and is started on PPI.  Hemoglobin is stabilized. ?  ?She was subsequently found to have MSSA bacteremia that appears to be disseminated.  Her left great toe was noted to be gangrenous, vascular surgery and orthopedics determined amputation will be best route for patient and this was done on 4/26.  Culture from great toe grew with MRSA and patient was broadened to vancomycin.  She was found to have multiple liver abscess, and history of portal vein thrombosis. ?  ?During hospital course she started to require increased oxygen support, ultimately transferred to ICU and intubated. ? ? ?Subjective: ?5/16 A/O x4.  Patient still eating or drinking very little. ? ?  ?Assessment & Plan: ?Covid vaccination; ?  ?Principal Problem: ?  Disseminated MSSA with liver abscess, left foot osteo and abscess, 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 ?Active Problems: ?  Multilobar pneumonia with acute hypoxic respiratory failure ([place]) ?  Hepatic abscess ?  Acute respiratory failure with hypoxia ([place]) ?  Acute on chronic blood loss anemia ?  Swelling of left hand ?  Portal vein thrombosis ?  Exudative pleural effusion ?  Involuntary commitment ?  Ascites ?  Vaginal discharge ?  Polysubstance abuse ([place]) ?  Hypophosphatemia ?  Candida infection, esophageal ([place]) ?  Opioid abuse with opioid-induced mood disorder ([place]) ?  Leucocytosis ?  Protein-calorie malnutrition, severe ?  Goals of care, counseling/discussion ?  Drug-seeking behavior ? ?Acute respiratory failure with  hypoxia and hypercapnia/positive MRSA pneumonia ?-Intubated 09/20/2021  ?-5/5 Extubation ?-Treated  with Teflaro and IV Flagyl initially.  ?-Completed 10 days coverage MRSA PNA>  ?-Follow ID recommendation ? ?Polymicrobial liver abscess (MSSA hepatic abscess) ?-Now on Unasyn to cover Liver Abscess. Will also cover Bacteroids ?-She will need TEE when more stable, per ID.  ?-Per ID note 4/18 will need at least 6 weeks of antibiotics ?-5/13 discussed case with Dr. Jule Ser, ID who stated we should consider this as week 4 of antibiotic use.  Patient will need antibiotics for 2 more weeks. ?- 5/15 contacted Cardiology spoke with card master  will evaluate patient for TEE ?-5/16 repeat echocardiogram pending ?- 5/16 discussed case with Dr. Gwyndolyn Kaufman, understands that patient needs TEE however probably will not tolerate TEE.  Will obtain repeat TTE first and decide if able to tolerate TEE.  Await recommendations ?-5/16 per ID continue Unasyn.  TEE will determine length of care.  Cardiology unlikely to perform TEE will await recommendations. ? ?Disseminated MSSA infection, osteo LEFT great toe  ?-S/p status post amputation ? ?Left groin possible abscess versus mass; ?-5 cm mass left groin and Doppler X lower extremity 09/20/2021. ?-Not seen on nonvascular ultrasound of left groin 5/1 Will need follow-up ? ?Osteomyelitis of thoracic/lumbar spine? ?- 5/12 MRI thoracic/lumbar spine negative abscess see results below ? ?Bilateral pleural effusion, right>> left ?-4/23 s/p status post RIGHT thoracentesis; exudative based on pleural fluid study.  Culture negative. ?-5/7 CXR : Stable infiltrate in the lateral left lung base, small layering right effusion with underlying opacities stable to mildly more prominent.  There is new right  perihilar opacity ? ?Volume overload, pulmonary edema acute ?-Received IV lasix and albumin 5/07 ?-Strict in and out -1.8 L ?- Daily weight ?Filed Weights  ? 10/03/21 0614 10/04/21 0500  10/06/21 0508  ?Weight: 52 kg 49.4 kg 51.1 kg  ?  ? ?Hypertensive urgency/Essential HTN ?-5/10 amlodipine 10 mg daily ?- 5/10 Catapres 0.1 mg transdermal ?-5/10 hydralazine 50 mg BID ?-5/10 hydralazine IV PRN ?-5/10 lisinopril 10 mg daily ?- 5/10 NTG drip goal SBP 110-130 ?-5/11 BP now at goal ?  ?  ?Acute encephalopathy  ?-Multifactorial sepsis, Chronic narcotic dependence. ?-Has been seen by psych and deemed not to have capacity to make medical decisions ?-Resolved ?  ?History of portal vein thrombosis:  ?-No candidate for anticoagulation.  ?  ?Acute GI bleed //Acute blood loss anemia;  ?admitted with a hemoglobin of 4.6 superimposed on chronic microcytic anemia ?-Endoscopy: Large anastomotic Ulceration.  ?On IV PPI.  ?Lab Results  ?Component Value Date  ? HGB 8.1 (L) 10/06/2021  ? HGB 7.0 (L) 10/05/2021  ? HGB 7.2 (L) 10/04/2021  ? HGB 7.7 (L) 10/03/2021  ? HGB 8.2 (L) 10/02/2021  ?-Stable ?  ?Anemia ?-Anemia panel pending ?-5/15 transfuse 1 unit PRBC ? ?Thrombocytopenia:  ?-Likely related to infection ?-Improved ?  ?Hypokalemia: ?-Potassium goal> 4 ?- 5/11 potassium IV 70 mEq ? ?Hypocalcemia ?- Corrected calcium= 9.14 no need for repletion at this time continue to monitor. ?  ?Hypoglycemia:  ?-5/11 TPN at goal, DC D10 ?  ?Homelessness ?-Will need to be addressed by LCSW ? ?Polysubstance abuse ?-4/10 positive cocaine ?Psych was consulted and recommended IVC, may need to revisit capacity again prior to discharge if cooperation becomes an issue. ?-5/13 patient's sister and/or boyfriend may have brought cocaine in the hospital for patient to consume. ?-5/13 have requested telemetry sitter, to ensure no illegal substances are being brought into patient ? ?Drug-seeking behavior ?-5/16 patient constantly requests increased pain medication, even though patient will not eat or drink. ? ?Vitamin D deficiency ?-On vitamin D supplements ? ?Severe protein caloric malnutrition and failure to thrive ?-She failed a swallow  evaluation.  She will need core track for tube feedings ?-Started  IV thiamine ?-Core track unable to be advance by IR> will ask GI to see.  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  He will not be possible to place a tube in her stomach due to limited space.  She also has a large anastomotic ulcer which complicate things.  We can consider surgery consult for a J-tube placement due to her anatomy. ?-5/8 started TPN .  She is probably very weak to undergoing surgical procedure for J tube placement. She passed swallow evaluation, started on full liquid diet. Hopefully we can discontinue TPN soon.  ?-5/11-calorie count pending ? ?Anorexia ?- 5/13 Marinol 5 mg BID ? ? ?Pressure Injury 09/24/21 Sacrum Medial Deep Tissue Pressure Injury - Purple or maroon localized area of discolored intact skin or blood-filled blister due to damage of underlying soft tissue from pressure and/or shear. several deep tissue injuries, dark  (Active)  ?09/24/21 1100  ?Location: Sacrum  ?Location Orientation: Medial  ?Staging: Deep Tissue Pressure Injury - Purple or maroon localized area of discolored intact skin or blood-filled blister due to damage of underlying soft tissue from pressure and/or shear.  ?Wound Description (Comments): several deep tissue injuries, dark purple, non-blanchable  ?Present on Admission: No  ?Dressing Type Foam - Lift dressing to assess site every shift  10/06/21 0758  ?   ?Pressure Injury 09/24/21 Buttocks Left;Posterior;Proximal Deep Tissue Pressure Injury - Purple or maroon localized area of discolored intact skin or blood-filled blister due to damage of underlying soft tissue from pressure and/or shear. several purple,  (Active)  ?09/24/21 1100  ?Location: Buttocks (gluteal fold)  ?Location Orientation: Left;Posterior;Proximal  ?Staging: Deep Tissue Pressure Injury - Purple or maroon localized area of discolored intact skin or blood-filled blister due to damage of  underlying soft tissue from pressure and/or shear.  ?Wound Description (Comments): several purple, non-blanchable  ?Present on Admission: No  ?Dressing Type Foam - Lift dressing to assess site every shift 10/06/21 07

## 2021-10-06 NOTE — Progress Notes (Signed)
Chart reviewed. Given significant comorbidities and known gastric ulcer at anastomotic site treated with PPI and carafate, patient is too high risk for sedation and TEE at this time. Recommend repeating surface TTE to assess for interval change as valves were well visualized on prior study.  ? ?Gwyndolyn Kaufman, MD ?

## 2021-10-07 ENCOUNTER — Inpatient Hospital Stay (HOSPITAL_COMMUNITY): Payer: Medicaid Other

## 2021-10-07 DIAGNOSIS — Z515 Encounter for palliative care: Secondary | ICD-10-CM | POA: Diagnosis not present

## 2021-10-07 DIAGNOSIS — M86172 Other acute osteomyelitis, left ankle and foot: Secondary | ICD-10-CM

## 2021-10-07 DIAGNOSIS — Z7189 Other specified counseling: Secondary | ICD-10-CM | POA: Diagnosis not present

## 2021-10-07 DIAGNOSIS — J9601 Acute respiratory failure with hypoxia: Secondary | ICD-10-CM | POA: Diagnosis not present

## 2021-10-07 DIAGNOSIS — A4901 Methicillin susceptible Staphylococcus aureus infection, unspecified site: Secondary | ICD-10-CM | POA: Diagnosis not present

## 2021-10-07 LAB — CBC WITH DIFFERENTIAL/PLATELET
Abs Immature Granulocytes: 0.01 10*3/uL (ref 0.00–0.07)
Basophils Absolute: 0 10*3/uL (ref 0.0–0.1)
Basophils Relative: 1 %
Eosinophils Absolute: 0.1 10*3/uL (ref 0.0–0.5)
Eosinophils Relative: 1 %
HCT: 23.6 % — ABNORMAL LOW (ref 36.0–46.0)
Hemoglobin: 7.8 g/dL — ABNORMAL LOW (ref 12.0–15.0)
Immature Granulocytes: 0 %
Lymphocytes Relative: 27 %
Lymphs Abs: 1 10*3/uL (ref 0.7–4.0)
MCH: 26.4 pg (ref 26.0–34.0)
MCHC: 33.1 g/dL (ref 30.0–36.0)
MCV: 79.7 fL — ABNORMAL LOW (ref 80.0–100.0)
Monocytes Absolute: 0.4 10*3/uL (ref 0.1–1.0)
Monocytes Relative: 10 %
Neutro Abs: 2.3 10*3/uL (ref 1.7–7.7)
Neutrophils Relative %: 61 %
Platelets: 133 10*3/uL — ABNORMAL LOW (ref 150–400)
RBC: 2.96 MIL/uL — ABNORMAL LOW (ref 3.87–5.11)
RDW: 23.8 % — ABNORMAL HIGH (ref 11.5–15.5)
WBC: 3.9 10*3/uL — ABNORMAL LOW (ref 4.0–10.5)
nRBC: 0 % (ref 0.0–0.2)

## 2021-10-07 LAB — COMPREHENSIVE METABOLIC PANEL
ALT: 28 U/L (ref 0–44)
AST: 28 U/L (ref 15–41)
Albumin: 2.1 g/dL — ABNORMAL LOW (ref 3.5–5.0)
Alkaline Phosphatase: 127 U/L — ABNORMAL HIGH (ref 38–126)
Anion gap: 5 (ref 5–15)
BUN: 30 mg/dL — ABNORMAL HIGH (ref 6–20)
CO2: 24 mmol/L (ref 22–32)
Calcium: 8.2 mg/dL — ABNORMAL LOW (ref 8.9–10.3)
Chloride: 110 mmol/L (ref 98–111)
Creatinine, Ser: 0.38 mg/dL — ABNORMAL LOW (ref 0.44–1.00)
GFR, Estimated: >60 mL/min (ref >60)
Glucose, Bld: 87 mg/dL (ref 70–99)
Potassium: 4.4 mmol/L (ref 3.5–5.1)
Sodium: 139 mmol/L (ref 135–145)
Total Bilirubin: 0.6 mg/dL (ref 0.3–1.2)
Total Protein: 5.8 g/dL — ABNORMAL LOW (ref 6.5–8.1)

## 2021-10-07 LAB — BLOOD GAS, ARTERIAL
Acid-base deficit: 1.6 mmol/L (ref 0.0–2.0)
Bicarbonate: 21.5 mmol/L (ref 20.0–28.0)
Delivery systems: POSITIVE
Drawn by: 23281
Expiratory PAP: 5 cmH2O
FIO2: 80 %
Inspiratory PAP: 10 cmH2O
O2 Saturation: 98.4 %
Patient temperature: 36.9
pCO2 arterial: 31 mmHg — ABNORMAL LOW (ref 32–48)
pH, Arterial: 7.45 (ref 7.35–7.45)
pO2, Arterial: 177 mmHg — ABNORMAL HIGH (ref 83–108)

## 2021-10-07 LAB — GLUCOSE, CAPILLARY: Glucose-Capillary: 86 mg/dL (ref 70–99)

## 2021-10-07 LAB — MAGNESIUM: Magnesium: 2 mg/dL (ref 1.7–2.4)

## 2021-10-07 LAB — PHOSPHORUS: Phosphorus: 4.2 mg/dL (ref 2.5–4.6)

## 2021-10-07 MED ORDER — GUAIFENESIN-DM 100-10 MG/5ML PO SYRP: 10.0000 mL | ORAL_SOLUTION | ORAL | Status: DC | PRN

## 2021-10-07 MED ORDER — ORAL CARE MOUTH RINSE
15.0000 mL | Freq: Two times a day (BID) | OROMUCOSAL | Status: DC
  Administered 2021-10-08 – 2021-10-09 (×4): 15 mL via OROMUCOSAL

## 2021-10-07 MED ORDER — LOPERAMIDE HCL 1 MG/7.5ML PO SUSP: 2.0000 mg | ORAL | Status: DC | PRN

## 2021-10-07 MED ORDER — MORPHINE SULFATE (PF) 2 MG/ML IV SOLN
1.0000 mg | Freq: Once | INTRAVENOUS | Status: AC
  Administered 2021-10-07: 1 mg via INTRAVENOUS
  Filled 2021-10-07: qty 1

## 2021-10-07 MED ORDER — CHLORHEXIDINE GLUCONATE 0.12 % MT SOLN
15.0000 mL | Freq: Two times a day (BID) | OROMUCOSAL | Status: DC
  Administered 2021-10-08 – 2021-10-09 (×4): 15 mL via OROMUCOSAL
  Filled 2021-10-07 (×6): qty 15

## 2021-10-07 MED ORDER — TRAVASOL 10 % IV SOLN
INTRAVENOUS | Status: DC
  Filled 2021-10-07: qty 1144.8

## 2021-10-07 NOTE — Progress Notes (Signed)
? ?                                                                                                                                                     ?                                                   ?Daily Progress Note  ? ?Patient Name: Terri [name]       Date: 10/07/2021 ?DOB: 1975-07-13  Age: 46 y.o. MRN#: [id number] ?Attending Physician: Barb Merino, MD ?Primary Care Physician: Pcp, No ?Admit Date: 08/31/2021 ? ?Reason for Consultation/Follow-up: Establishing goals of care ? ?Subjective: ?I saw Terri [name] today and discussed with her and also with her significant other, Terri [name], as I saw him in the hall.   ? ?Terri [name] reports understanding continued concern about nutrition, but he feels that is is improving a little day by day. ?She continued to endorse full code, full scope care, knows that she has a long road ahead of her, wants her daughter Terri [name] to make decisions on her behalf if she is ever not able to make her own decisions.  ?  ?Length of Stay: 36 ? ?Current Medications: ?Scheduled Meds:  ? amLODipine  10 mg Oral Daily  ? calcium carbonate  1 tablet Oral TID  ? chlorhexidine  15 mL Mouth Rinse BID  ? Chlorhexidine Gluconate Cloth  6 each Topical Daily  ? cholecalciferol  1,000 Units Oral Daily  ? cloNIDine  0.1 mg Transdermal Weekly  ? dronabinol  5 mg Oral BID AC  ? feeding supplement  237 mL Oral TID BM  ? folic acid  1 mg Oral Daily  ? guaiFENesin  5 mL Oral Q6H  ? heparin injection (subcutaneous)  5,000 Units Subcutaneous Q8H  ? hydrALAZINE  50 mg Oral BID  ? ipratropium-albuterol  3 mL Nebulization BID  ? lidocaine  1 patch Transdermal Daily  ? lisinopril  10 mg Oral QHS  ? mouth rinse  15 mL Mouth Rinse q12n4p  ? multivitamin with minerals  1 tablet Oral Daily  ? nicotine  21 mg Transdermal Daily  ? nutrition supplement (JUVEN)  1 packet Oral BID BM  ? pantoprazole  40 mg Oral BID  ? sodium chloride flush  10-40 mL Intracatheter Q12H  ? sucralfate  1 g Oral TID WC & HS  ? thiamine  100 mg  Oral Daily  ? ? ?Continuous Infusions: ? sodium chloride 10 mL/hr at 09/30/21 0800  ? sodium chloride Stopped (09/30/21 1352)  ? ampicillin-sulbactam (UNASYN) IV 3 g (10/07/21 0816)  ? ? ?PRN Meds: ?sodium chloride, acetaminophen (TYLENOL) oral liquid 160 mg/5 mL **OR** [DISCONTINUED]  acetaminophen, dextrose, guaiFENesin-dextromethorphan, haloperidol lactate **AND** [DISCONTINUED] LORazepam, hydrALAZINE, HYDROcodone-acetaminophen, ipratropium-albuterol, lip balm, loperamide HCl, naLOXone (NARCAN)  injection, ondansetron (ZOFRAN) IV, saline, sodium chloride flush ?    ? ?Vital Signs: BP (!) 140/91 (BP Location: Left Arm)   Pulse (!) 107   Temp 99.8 ?F (37.7 ?C) (Oral)   Resp (!) 24   Ht '5\' 9"'$  (1.753 m)   Wt 51.1 kg   LMP  (LMP Unknown)   SpO2 94%   BMI 16.64 kg/m?  ?SpO2: SpO2: 94 % ?O2 Device: O2 Device: Room Air ?O2 Flow Rate: O2 Flow Rate (L/min): 2 L/min ?Awake alert ?Frail and weak ?Shallow breath sounds ?S 1 S 2 ? ?Intake/output summary:  ?Intake/Output Summary (Last 24 hours) at 10/07/2021 1506 ?Last data filed at 10/07/2021 1300 ?Gross per 24 hour  ?Intake 1203.86 ml  ?Output 1050 ml  ?Net 153.86 ml  ? ?LBM: Last BM Date : 10/07/21 ?Baseline Weight: Weight: 53.2 kg ?Most recent weight: Weight: 51.1 kg ? ?     ?Palliative Assessment/Data: ? ? ? ? ? ?Patient Active Problem List  ? Diagnosis Date Noted  ? Anxiety 02/11/2014  ? Disseminated MSSA with liver abscess, left foot osteo and abscess, 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   ? Acute respiratory failure with hypoxia ([place]) 09/15/2021  ? Hepatic abscess   ? Multilobar pneumonia with acute hypoxic respiratory failure ([place])   ? Acute on chronic blood loss anemia 04/01/2021  ? Swelling of left hand   ? Involuntary commitment 09/19/2021  ? Exudative pleural effusion 09/12/2021  ? 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 Dysuria 09/15/2021  ? Vaginal discharge 09/14/2021  ? Ascites 09/11/2021  ? Hypophosphatemia 09/12/2021  ? Polysubstance abuse ([place]) 01/16/2021  ?  Hypokalemia   ? Essential hypertension   ? Candida infection, esophageal ([place]) 09/03/2021  ? Drug-seeking behavior 10/06/2021  ? Goals of care, counseling/discussion   ? Protein-calorie malnutrition, severe 09/21/2021  ? Acute back pain   ? Community acquired pneumonia 07/08/2021  ? Homelessness 07/08/2021  ? Cellulitis of right lower extremity 05/28/2021  ? Vitamin B12 deficiency 05/24/2021  ? Cellulitis 05/22/2021  ? COVID-19 virus infection 05/22/2021  ? Overdose 04/13/2021  ? Hypoxemia 04/13/2021  ? Hypoglycemia 04/13/2021  ? Asthma exacerbation 04/01/2021  ? Iron deficiency anemia 03/18/2021  ? Hyponatremia 03/18/2021  ? Marginal ulcer 03/15/2021  ? Microcytic anemia 02/26/2021  ? Mild protein malnutrition ([place]) 02/26/2021  ? Abdominal pain 01/14/2021  ? 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 Cocaine abuse ([place]) 01/13/2021  ? Heroin abuse ([place]) 01/13/2021  ? Hypertensive urgency 01/13/2021  ? Tobacco abuse 01/13/2021  ? SIRS (systemic inflammatory response syndrome) ([place]) 01/13/2021  ? Acute pyelonephritis 02/11/2019  ? Sepsis due to Escherichia coli (E. coli) ([place]) 02/11/2019  ? AKI (acute kidney injury) ([place])   ? Peptic ulcer disease 02/09/2019  ? 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 Persistent fever 02/09/2019  ? COVID-19 virus not detected   ? Chest pain   ? Bacteremia due to Escherichia coli   ? Esophagitis   ? Hypomagnesemia   ? Sepsis ([place]) 02/02/2019  ? Leucocytosis 02/02/2019  ? Thrombocytosis 02/02/2019  ? Acute lower UTI 02/02/2019  ? Apnea 09/13/2017  ? Cellulitis and abscess of neck 08/12/2017  ? Cellulitis of submandibular region 08/10/2017  ? Odontogenic infection of jaw 08/10/2017  ? Cholecystitis 02/14/2017  ? Opioid abuse with opioid-induced mood disorder ([place]) 01/25/2017  ? Depression 07/12/2012  ? ? ?  Palliative Care Assessment & Plan  ? ?Recommendations/Plan: ?Full code/full scope ?Terri [name] remains very frail but she  is clear in desire to continue with all offered interventions.  Palliative to continue to follow peripherally and will check in periodically with patient and her family.  Please call if there are palliative specific needs with which our team can be of assistance.  ? ?Code Status: ? ?  ?Code Status Orders  ?(From admission, onward)  ?  ? ? ?  ? ?  Start     Ordered  ? 08/31/21 1940  Full code  Continuous       ? 08/31/21 1939  ? ?  ?  ? ?  ? ? ?Micheline Rough, MD ? ?Please contact Palliative Medicine Team phone at [phone] for questions and concerns.  ? ? ? ? ? ?

## 2021-10-07 NOTE — Progress Notes (Addendum)
PHARMACY - TOTAL PARENTERAL NUTRITION CONSULT NOTE  ? ?Indication:  intolerance to enteral feeding ? ?Patient Measurements: ?Height: _0  (175.3 cm) ?Weight: 51.1 kg (112 lb 10.5 oz) ?IBW/kg (Calculated) : 66.2 ?TPN AdjBW (KG): 66.7 ?Body mass index is 16.64 kg/m?. ?Usual Weight:  ? ?Assessment: 74 yoF admitted on 08/31/21 with bloody stools.  PMH significant for homelessness, recurrent GIB, chronic microcytic anemia, hypertension, polysubstance abuse, Roux n Y gastric bypass.  She is s/p endoscopy showing large anastomotic ulceration and is being treated with PPI.  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  ?Dobhoff feeding tube placed, required removal d/t unable to advance to correct position.  Pharmacy is now consulted to dose TPN.   ? ?48-hour calorie count completed on 5/15:  ?- 5/12: total intake 135 kcal (6% of estimated needs), 6g protein (6% of estimated needs) ?- 5/13: total intake 65 kcal (3% of estimated needs), 0g protein ? ?Glucose / Insulin: CBGs all < 150,  DC'd  CBGs 5/12 ?Electrolytes: lytes WNL ?Renal: SCr 0.38, BUN 30 (increasing) ?Hepatic: AST/ALT and Tbili WNL, Alk phos slight elev at 127, Albumin 2, TG 25 (5/15) ?Intake / Output; MIVF: +240 mL PO intake, +1.2L IV intake ?- LBM 5/16, UOP 750 mL/24h  ?GI Imaging: none since TPN initiation ?GI Surgeries / Procedures: none since TPN initiation ? ?Central access: PICC ?TPN start date: 5/9  ? ?Nutritional Goals: ?Goal TPN rate is 90 mL/hr (provides 114 g of protein and 2281 kcals per day) ? ?RD Assessment: (5/11) ?Estimated Needs ?Total Energy Estimated Needs: 2150-2350 kcal ?Total Protein Estimated Needs: 100-115 grams ?Total Fluid Estimated Needs: >/= 2.2 L/day ? ?Current Nutrition:  ?TPN to provide 100% of needs ?Diet advanced to regular diet to allow patient to have more food choices in hopes of improving PO intake.  ?Ensure plus TID - accepted all doses 5/16 ?Juven bid -  accepted all doses 5/16 ?5/13 Marinol added ? ?Plan:  ?Discussed with MD - plan to taper TPN to half rate (45 mL/hr) for 2 hours, then discontinue TPN today ?Electrolytes in TPN:  ?Na 136mq/L ?K 513m/L ?Ca 43m57mL ?Mg 7mE43m ?Phos 12mm53m ?Cl:Ac 1:2 ?Oral MVI, thiamine, folic acid, calcium, vit D, PPI ?Monitor TPN labs on Mon/Thurs ? ?Will discontinue order for full dose TPN this evening - MD aware if patient does not tolerate TPN taper/discontinuation, may need to initiate dextrose drip. ? ?Mary-Dimple NanasrmD ?10/07/2021 8:38 AM ? ?

## 2021-10-07 NOTE — Progress Notes (Signed)
?   ? ?[name] ? ?Date of Admission:  08/31/2021    ?       ?Reason for visit: Follow up on disseminated staph infection ? ?Current antibiotics: ?Unasyn ? ?ASSESSMENT:   ? ?46 y.o. female admitted with: ? 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 ? ?RECOMMENDATIONS:   ? ?TTE yesterday did not see any evidence of vegetation, however, was noted to be a poor quality study ?Given that we have not been able to definitively exclude endocarditis in the setting of disseminated staph infection, favor providing 6 weeks of antibiotics from the date of her hepatic abscess aspiration ?Continue Unasyn.  We will plan antibiotics through 5/29.  If she were to discharge prior to this date could transition to linezolid 600 mg twice daily to finish therapy ?Lab monitoring ?Nutrition ?We will sign off, please call as needed ? ? ?Principal Problem: ?  Disseminated MSSA with liver abscess, left foot osteo and abscess, 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 ?Active Problems: ?  Opioid abuse with opioid-induced mood disorder ([place]) ?  Leucocytosis ?  Polysubstance abuse ([place]) ?  Acute on chronic blood loss anemia ?  Multilobar pneumonia with acute hypoxic respiratory failure ([place]) ?  Portal vein thrombosis ?  Candida infection, esophageal ([place]) ?  Hepatic abscess ?  Swelling of left hand ?  Ascites ?  Exudative pleural effusion ?  Hypophosphatemia ?  Vaginal discharge ?  Acute respiratory failure with hypoxia ([place]) ?  Involuntary commitment ?  Protein-calorie malnutrition, severe ?  Goals of care, counseling/discussion ?  Drug-seeking behavior ? ? ? ?MEDICATIONS:   ? ?Scheduled Meds: ?  amLODipine  10 mg Oral Daily  ? calcium carbonate  1 tablet Oral TID  ? chlorhexidine  15 mL Mouth Rinse BID  ? Chlorhexidine Gluconate Cloth  6 each Topical Daily  ? cholecalciferol  1,000 Units Oral Daily  ? cloNIDine  0.1 mg Transdermal Weekly  ? dronabinol  5 mg Oral BID AC  ? feeding supplement  237 mL Oral TID BM  ? folic acid  1 mg Oral Daily  ? guaiFENesin  5 mL Oral Q6H  ? heparin injection (subcutaneous)  5,000 Units Subcutaneous Q8H  ? hydrALAZINE  50 mg Oral BID  ? ipratropium-albuterol  3 mL Nebulization BID  ? lidocaine  1 patch Transdermal Daily  ? lisinopril  10 mg Oral QHS  ? mouth rinse  15 mL Mouth Rinse q12n4p  ? multivitamin with minerals  1 tablet Oral Daily  ? nicotine  21 mg Transdermal Daily  ? nutrition supplement (JUVEN)  1 packet Oral BID BM  ? pantoprazole  40 mg Oral BID  ? sodium chloride flush  10-40 mL Intracatheter Q12H  ? sucralfate  1 g Oral TID WC & HS  ? thiamine  100 mg Oral Daily  ? ?Continuous Infusions: ? sodium chloride 10 mL/hr at 09/30/21 0800  ? sodium chloride Stopped (09/30/21 1352)  ? ampicillin-sulbactam (UNASYN) IV 3 g (10/07/21 0816)  ? TPN ADULT (ION) 90 mL/hr at 10/06/21 1711  ? ?PRN Meds:.sodium chloride, acetaminophen (TYLENOL) oral liquid 160 mg/5 mL **OR** acetaminophen, dextrose, guaiFENesin-dextromethorphan, haloperidol lactate **AND** [DISCONTINUED] LORazepam, hydrALAZINE, HYDROcodone-acetaminophen, ipratropium-albuterol, lip balm, loperamide HCl,  naLOXone (NARCAN)  injection, ondansetron (ZOFRAN) IV, saline, sodium chloride flush ? ?SUBJECTIVE:  ? ?24 hour events:  ?No events ? ? ?Asking me for pain medication ?No fevers or chills ?Stable on room air ? ?Review of Systems  ?All other systems reviewed and are negative. ? ?  ?OBJECTIVE:  ? ?Blood pressure (!) 139/98, pulse 98, temperature 99.2 ?F (37.3 ?C), temperature source Oral, resp. rate 13, height '5\' 9"'$  (1.753 m), weight 51.1 kg, SpO2 93 %. ?Body mass index is 16.64 kg/m?. ? ?Physical  Exam ?Constitutional:   ?   Comments: Thin, cachectic, chronically ill-appearing woman lying in bed.  No acute distress  ?HENT:  ?   Head: Normocephalic and atraumatic.  ?Eyes:  ?   Extraocular Movements: Extraocular movements intact.  ?   Conjunctiva/sclera: Conjunctivae normal.  ?Cardiovascular:  ?   Rate and Rhythm: Normal rate and regular rhythm.  ?Pulmonary:  ?   Effort: Pulmonary effort is normal. No respiratory distress.  ?Abdominal:  ?   General: There is no distension.  ?   Palpations: Abdomen is soft.  ?   Tenderness: There is no abdominal tenderness.  ?Musculoskeletal:     ?   General: Normal range of motion.  ?   Cervical back: Normal range of motion and neck supple.  ?Skin: ?   General: Skin is warm and dry.  ?   Findings: No rash.  ?Neurological:  ?   General: No focal deficit present.  ?   Mental Status: She is oriented to person, place, and time.  ?Psychiatric:     ?   Mood and Affect: Mood normal.     ?   Behavior: Behavior normal.  ? ? ? ?Lab Results: ?Lab Results  ?Component Value Date  ? WBC 3.9 (L) 10/07/2021  ? HGB 7.8 (L) 10/07/2021  ? HCT 23.6 (L) 10/07/2021  ? MCV 79.7 (L) 10/07/2021  ? PLT 133 (L) 10/07/2021  ?  ?Lab Results  ?Component Value Date  ? NA 139 10/07/2021  ? K 4.4 10/07/2021  ? CO2 24 10/07/2021  ? GLUCOSE 87 10/07/2021  ? BUN 30 (H) 10/07/2021  ? CREATININE 0.38 (L) 10/07/2021  ? CALCIUM 8.2 (L) 10/07/2021  ? GFRNONAA >60 10/07/2021  ? GFRAA >60 07/15/2019  ?  ?Lab Results  ?Component Value Date  ? ALT 28 10/07/2021  ? AST 28 10/07/2021  ? ALKPHOS 127 (H) 10/07/2021  ? BILITOT 0.6 10/07/2021  ? ? ?   ?Component Value Date/Time  ? CRP 1.2 (H) 05/29/2021 0346  ? ? ?   ?Component Value Date/Time  ? ESRSEDRATE 20 02/26/2014 0958  ? ?  ?I have reviewed the micro and lab results in Epic. ? ?Imaging: ?DG CHEST PORT 1 VIEW ? ?Result Date: 10/05/2021 ?CLINICAL DATA:  Dark stool.  Chronic anemia. EXAM: PORTABLE CHEST 1 VIEW COMPARISON:  Radiograph 09/27/2021 FINDINGS: Normal cardiac  silhouette. RIGHT central venous line tip in distal SVC. Lungs are hyperinflated. Small bilateral pleural effusions. Mild airspace disease in the RIGHT lower lobe is improved from comparison exam. IMPRESSION: 1. Improvement in RIGHT lower lobe airspace disease. 2. Hyperinflated lungs. 3. Small effusions. Electronically Signed   By: Suzy Bouchard M.D.   On: 10/05/2021 14:08  ? ?ECHOCARDIOGRAM COMPLETE ? ?Result Date: 10/06/2021 ?   ECHOCARDIOGRAM REPORT   Patient Name:   Terri [name] Date of Exam: 10/06/2021 Medical Rec #:  [id number]     Height:       69.0 in Accession #:  [id number]    Weight:       112.7 lb Date of Birth:  April 10, 1976     BSA:          1.619 m? Patient Age:    72 years      BP:           120/90 mmHg Patient Gender: F             HR:           87 bpm. Exam Location:  Inpatient Procedure: 2D Echo, Cardiac Doppler and Color Doppler Indications:    Endocarditis  History:        Patient has prior history of Echocardiogram examinations, most                 recent 09/09/2021. Risk Factors:Hypertension.  Sonographer:    Joette Catching RCS Referring Phys: [id number] Nira Conn E [place]  Sonographer Comments: Technically challenging study due to limited acoustic windows. Image acquisition challenging due to patient body habitus. IMPRESSIONS  1. Left ventricular ejection fraction, by estimation, is 60 to 65%. The left ventricle has normal function. The left ventricle has no regional wall motion abnormalities. Left ventricular diastolic parameters are consistent with Grade I diastolic dysfunction (impaired relaxation).  2. Right ventricular systolic function is normal. The right ventricular size is normal.  3. The mitral valve is normal in structure. Trivial mitral valve regurgitation. No evidence of mitral stenosis.  4. The aortic valve is normal in structure. There is mild calcification of the aortic valve. Aortic valve regurgitation is trivial. Aortic valve sclerosis/calcification is present, without any  evidence of aortic stenosis.  5. The inferior vena cava is normal in size with greater than 50% respiratory variability, suggesting right atrial pressure of 3 mmHg. Comparison(s): No significant change from prior study. Prior

## 2021-10-07 NOTE — Progress Notes (Signed)
? ? ?  OVERNIGHT PROGRESS REPORT ? ?Notified by rapid response/RN for increased work of breathing, anxiety, and increased oxygen demand. ? ? ?Brief history ?46 year old history of homelessness recurrent GI bleed, chronic anemia, polysubstance abuse, hypertension, Roux-en-Y surgery. ?Originally admitted to the hospital with melanotic stool with history of chronic GI bleed secondary to peptic ulcers. ? ? ?Patient is now moved to SDU on BiPAP for increased work of breathing, increasing oxygen demand. ? ? ?Obtained chest x-ray -pending. ? ?Ordered morphine for pain in side, aid in oxygen demand, and breathing. ?Patient is obviously anxious and admits to such. ? ? ?Update 2335 hrs. ?Obtained ABG -pending. ?Patient will remain on BiPAP ?Patient expresses improved breathing, reduced difficulty breathing. ?Anxiety is mildly reduced. ?Patient work of breathing has improved, she is still anxious somewhat but improving. ? ? ? ?Gershon Cull MSNA MSN ACNPC-AG ?Acute Care Nurse Practitioner ?[facility] ?[place] ? ? ? ?

## 2021-10-07 NOTE — Progress Notes (Signed)
Speech Language Pathology Treatment: Dysphagia  ?Patient Details ?Name: Terri [name] ?MRN: [id number] ?DOB: 11/06/1975 ?Today's Date: 10/07/2021 ?Time: 2202-5427 ?SLP Time Calculation (min) (ACUTE ONLY): 10 min ? ?Assessment / Plan / Recommendation ?Clinical Impression ? Pt seen with am meal of chopped up omelet. She was able to feed herself, though she only took about three bites of her food and asked to stop. Pt is edentulous and states that at baseline she eats whatever she wants. She is able to [facility] with her gums with extra time. Given that she appears to have poor appetite and limited interest in food, an unrestricted diet that allows her to choose whatever she wants may be best now that pt is gradually improving. She may have textures that she cannot masticate and need to spit out. Will f/u to observe with regular meals to see if pt can manage them.  ?  ?HPI HPI: Gastroenterology was consulted, EGD on 09/03/2021 showed esophageal plaques consistent with candidiasis, large cratered nonbleeding duodenal ulcer with no stigmata of bleeding.  bx 4/17 showed acute inflammation with necrosis compatible with abscess, granulomatous inflammation with eosinophilia and fragment of foreign material, negative for malignancy;  CXR Grossly stable right upper lobe pneumonia. Stable mild left basilar atelectasis or infiltrate., incr ox needs thus TEE deferred, 4/23  thora and s/p paracentesis 4/17; ? Endocarditis; cocaine +. Bedside swallow evaluation completed by SLP on 09/15/21 and patient's swallow at that time appeared largely [facility] and no f/u recommended. Patient had to be intubated on 4/30 secondary to tachypneic with accessory muscle use and increased WOB. She was extubated on 09/25/21 and has been NPO awaiting SLP swallow evaluation. MBS completed on 09/29/21 showing moderate oral phase dysphagia but pharyngeal and cervical phase swallow both WFL and patient started on Dys 3 (mechanical soft) solids, thin liquids. ?  ?    ?SLP Plan ? Continue with current plan of care ? ?  ?  ?Recommendations for follow up therapy are one component of a multi-disciplinary discharge planning process, led by the attending physician.  Recommendations may be updated based on patient status, additional functional criteria and insurance authorization. ?  ? ?Recommendations  ?Diet recommendations: Regular;Thin liquid ?Liquids provided via: Cup;Straw ?Medication Administration: Whole meds with puree ?Supervision: Patient able to self feed;Staff to assist with self feeding ?Compensations: Minimize environmental distractions ?Postural Changes and/or Swallow Maneuvers: Seated upright 90 degrees;Upright 30-60 min after meal  ?   ?    ?   ? ? ? ? Oral Care Recommendations: Oral care BID ?Follow Up Recommendations: Skilled nursing-short term rehab (<3 hours/day) ?Assistance recommended at discharge: Frequent or constant Supervision/Assistance ?SLP Visit Diagnosis: Dysphagia, pharyngoesophageal phase (R13.14) ?Plan: Continue with current plan of care ? ? ? ? ?  ?  ? ? ?Nandita Mathenia, Katherene Ponto ? ?10/07/2021, 9:15 AM ?

## 2021-10-07 NOTE — Progress Notes (Signed)
Pt transported from 1516 to ICU 1232 on bipap.  Pt tolerated transport well without incident. ?

## 2021-10-07 NOTE — Progress Notes (Signed)
Pt with pain and IV fentanyl pulled.  ? ?MD Ghimire in room and okay to given fentanyl prior to discontinue. Given 6 minutes early. ? ?Pt and friend Fritz Pickerel at bedside aware fentanyl IV discontinued. ?

## 2021-10-07 NOTE — Progress Notes (Signed)
?PROGRESS NOTE ? ? ? ?Terri [name]  WHQ:[id number] DOB: 07-29-1975 DOA: 08/31/2021 ?PCP: Pcp, No  ? ? ?Brief Narrative:  ?46 year old with history of homelessness, recurrent GI bleeding, chronic microcytic anemia, hypertension, polysubstance abuse, Roux-en-Y surgery initially admitted to hospital with melanotic stool with history of chronic GI bleeding secondary to peptic ulcers.  Underwent endoscopy with large anastomotic ulceration noted, started on PPI and stabilized. ?While she was in the hospital found to have MSSA bacteremia with dissemination.  Her left great toe was gangrenous, underwent amputation.  Culture from the great toe grew MRSA. ?Patient was also found with multiple liver abscesses, has history of portal vein thrombosis. ?4/28, transferred to ICU with increasing oxygen demand.  4/30, intubated-5/5 extubated and transferred to medical floor. ? ?Esophageal candidiasis, finished Diflucan. ?MRSA pneumonia, grade 2 osteomyelitis status post amputation-treated with vancomycin and subsequently Teflaro ?Polymicrobial hepatic abscess, MSSA bacteremia-currently on Unasyn with plan until 5/31. ?Unable to do TEE.  Repeat echocardiogram with poor visualization of cardiac valves.  Treating as presumed endocarditis. ? ? ?Assessment & Plan: ?  ?Acute hypoxemic and hypercapnic respiratory failure secondary to MRSA pneumonia: ?Intubated extubated.  Completed antibiotic therapy. ?Currently on room air.  Continue aggressive chest physiotherapy and mobility. ? ?Polymicrobial liver abscess, MSSA hepatic abscess with disseminated infection: ?Unasyn until 5/31 ?Cardiology recommended too risky to perform TEE, repeat TTE with no evidence of endocarditis, however poor visual. ?Planning to finish 6 weeks of IV antibiotics until 5/29 ?Bacteremia cleared. ?Source removed, left great toe. ? ?Hypertensive urgency, essential hypertension: ?Blood pressures at goal now currently on amlodipine 10 mg, clonidine 0.1 mg/ 24 hours patch,  hydralazine 50 mg twice daily. ? ?Acute metabolic infective encephalopathy: Resolved.  Mental status improved. ? ?Acute GI bleeding with acute blood loss anemia: ?Presentation hemoglobin 4.6.  Received 6 units of PRBC.  Hemoglobin is stabilized.  Will monitor closely.  On PPI. ? ?Severe protein calorie malnutrition, failure to thrive: ?Initially failed swallow evaluation, core track unable to be advanced by IR due to difficult gastric anatomy and very small gastric pouch ?5/8, started on TPN due to very poor oral intake ?Plan, ?Taper off TPN as patient is taking oral supplements now. ?Marinol 5 mg twice daily ?Nutritional supplements ?Advance to regular diet today and hope of tapering off TPN. ? ?Pain management: ?Patient on IV fentanyl and oral pain medications, lidocaine even after 1 month in the hospital.  Discontinue all IV pain medications, continue oral pain medication with hydrocodone, Tylenol, lidocaine patch. ? ? ? ? ?DVT prophylaxis: heparin injection 5,000 Units Start: 09/30/21 2230 ?Place and maintain sequential compression device Start: 09/22/21 0443 ?Place and maintain sequential compression device Start: 09/11/21 2114 ?SCDs Start: 08/31/21 1939 ? ? ?Code Status: Full code.  Seen by palliative. ?Family Communication: Boyfriend at the bedside. ?Disposition Plan: Status is: Inpatient ?Remains inpatient appropriate because: Unsafe discharge disposition plan.  No oral intake. ?  ? ? ?Consultants:  ?Multiple. ?Critical care, palliative care, gastroenterology ? ?Procedures:  ?Multiple procedures as above. ? ?Antimicrobials:  ?Multiple medications.  Currently on Unasyn until 5/29. ? ? ?Subjective: ?Patient seen and examined.  Denies back pain and using fentanyl.  Denies any nausea vomiting.  Appetite is poor that is why she does not eat.  Loose stool in Flexi-Seal.  Boyfriend at the bedside.  Agreeable to eat more. ? ?Objective: ?Vitals:  ? 10/06/21 1546 10/06/21 2100 10/07/21 0500 10/07/21 0817  ?BP: (!)  125/96 119/77 (!) 139/98   ?Pulse: 92 95 98   ?Resp:  12 13   ?Temp:  98.2 ?F (36.8 ?C) 99.2 ?F (37.3 ?C)   ?TempSrc:  Oral Oral   ?SpO2: 95% 91% 90% 93%  ?Weight:      ?Height:      ? ? ?Intake/Output Summary (Last 24 hours) at 10/07/2021 1248 ?Last data filed at 10/07/2021 1100 ?Gross per 24 hour  ?Intake 1143.86 ml  ?Output 1050 ml  ?Net 93.86 ml  ? ?Filed Weights  ? 10/03/21 0614 10/04/21 0500 10/06/21 0508  ?Weight: 52 kg 49.4 kg 51.1 kg  ? ? ?Examination: ? ?General: Thin and cachectic.  Chronically sick looking.  Frail and debilitated.  Ill-appearing.  On room air. ?Cardiovascular: S1-S2 normal.  Regular rate rhythm. ?Respiratory: Bilateral clear.  On room air.  Occasionally tachypneic. ?Gastrointestinal: Soft.  Nontender.  Bowel sound present. ?Ext: Cachectic extremities.  No edema no swelling. ?Neuro: Alert and awake.  Oriented x3-4.  Generalized weakness.  No acute deficits. ?Musculoskeletal: No deformities. ?Skin:  ? ?Pressure Injury 09/24/21 Sacrum Medial Deep Tissue Pressure Injury - Purple or maroon localized area of discolored intact skin or blood-filled blister due to damage of underlying soft tissue from pressure and/or shear. several deep tissue injuries, dark  (Active)  ?09/24/21 1100  ?Location: Sacrum  ?Location Orientation: Medial  ?Staging: Deep Tissue Pressure Injury - Purple or maroon localized area of discolored intact skin or blood-filled blister due to damage of underlying soft tissue from pressure and/or shear.  ?Wound Description (Comments): several deep tissue injuries, dark purple, non-blanchable  ?Present on Admission: No  ?   ?Pressure Injury 09/24/21 Buttocks Left;Posterior;Proximal Deep Tissue Pressure Injury - Purple or maroon localized area of discolored intact skin or blood-filled blister due to damage of underlying soft tissue from pressure and/or shear. several purple,  (Active)  ?09/24/21 1100  ?Location: Buttocks (gluteal fold)  ?Location Orientation: Left;Posterior;Proximal   ?Staging: Deep Tissue Pressure Injury - Purple or maroon localized area of discolored intact skin or blood-filled blister due to damage of underlying soft tissue from pressure and/or shear.  ?Wound Description (Comments): several purple, non-blanchable  ?Present on Admission: No  ?   ?Pressure Injury 09/24/21 Vertebral column Lower Deep Tissue Pressure Injury - Purple or maroon localized area of discolored intact skin or blood-filled blister due to damage of underlying soft tissue from pressure and/or shear. spinal column deep tissue, (Active)  ?09/24/21 1100  ?Location: Vertebral column  ?Location Orientation: Lower  ?Staging: Deep Tissue Pressure Injury - Purple or maroon localized area of discolored intact skin or blood-filled blister due to damage of underlying soft tissue from pressure and/or shear.  ?Wound Description (Comments): spinal column deep tissue, purple, non-blanchable  ?Present on Admission: No  ?   ?Pressure Injury 09/24/21 Vertebral column Right;Upper Deep Tissue Pressure Injury - Purple or maroon localized area of discolored intact skin or blood-filled blister due to damage of underlying soft tissue from pressure and/or shear. purple, non-blanchab (Active)  ?09/24/21 1100  ?Location: Vertebral column  ?Location Orientation: Right;Upper  ?Staging: Deep Tissue Pressure Injury - Purple or maroon localized area of discolored intact skin or blood-filled blister due to damage of underlying soft tissue from pressure and/or shear.  ?Wound Description (Comments): purple, non-blanchable  ?Present on Admission: No  ? ?  ? ? ? ?Data Reviewed: I have personally reviewed following labs and imaging studies ? ?CBC: ?Recent Labs  ?Lab 10/03/21 ?0500 10/04/21 ?0414 10/05/21 ?4315 10/06/21 ?0434 10/07/21 ?0321  ?WBC 4.3 3.2* 2.9* 3.7* 3.9*  ?NEUTROABS 3.2 2.1 1.7 2.3 2.3  ?  HGB 7.7* 7.2* 7.0* 8.1* 7.8*  ?HCT 23.1* 21.9* 21.0* 24.5* 23.6*  ?MCV 79.9* 79.9* 81.1 79.0* 79.7*  ?PLT 92* 102* 107* 128* 133*  ? ?Basic  Metabolic Panel: ?Recent Labs  ?Lab 10/03/21 ?0500 10/04/21 ?0414 10/05/21 ?9833 10/06/21 ?0434 10/07/21 ?0321  ?NA 131* 135 132* 135 139  ?K 3.7 4.2 4.3 4.4 4.4  ?CL 103 106 105 106 110  ?CO2 '23 23 22 24 '$ 2

## 2021-10-08 DIAGNOSIS — A4901 Methicillin susceptible Staphylococcus aureus infection, unspecified site: Secondary | ICD-10-CM | POA: Diagnosis not present

## 2021-10-08 DIAGNOSIS — L8990 Pressure ulcer of unspecified site, unspecified stage: Secondary | ICD-10-CM | POA: Insufficient documentation

## 2021-10-08 LAB — CBC WITH DIFFERENTIAL/PLATELET
Abs Immature Granulocytes: 0.01 10*3/uL (ref 0.00–0.07)
Basophils Absolute: 0 10*3/uL (ref 0.0–0.1)
Basophils Relative: 0 %
Eosinophils Absolute: 0 10*3/uL (ref 0.0–0.5)
Eosinophils Relative: 0 %
HCT: 24.9 % — ABNORMAL LOW (ref 36.0–46.0)
Hemoglobin: 8.1 g/dL — ABNORMAL LOW (ref 12.0–15.0)
Immature Granulocytes: 0 %
Lymphocytes Relative: 11 %
Lymphs Abs: 0.8 10*3/uL (ref 0.7–4.0)
MCH: 26.4 pg (ref 26.0–34.0)
MCHC: 32.5 g/dL (ref 30.0–36.0)
MCV: 81.1 fL (ref 80.0–100.0)
Monocytes Absolute: 0.7 10*3/uL (ref 0.1–1.0)
Monocytes Relative: 11 %
Neutro Abs: 5.4 10*3/uL (ref 1.7–7.7)
Neutrophils Relative %: 78 %
Platelets: 153 10*3/uL (ref 150–400)
RBC: 3.07 MIL/uL — ABNORMAL LOW (ref 3.87–5.11)
RDW: 23.7 % — ABNORMAL HIGH (ref 11.5–15.5)
WBC: 6.9 10*3/uL (ref 4.0–10.5)
nRBC: 0 % (ref 0.0–0.2)

## 2021-10-08 LAB — COMPREHENSIVE METABOLIC PANEL
ALT: 32 U/L (ref 0–44)
AST: 31 U/L (ref 15–41)
Albumin: 2.2 g/dL — ABNORMAL LOW (ref 3.5–5.0)
Alkaline Phosphatase: 137 U/L — ABNORMAL HIGH (ref 38–126)
Anion gap: 5 (ref 5–15)
BUN: 28 mg/dL — ABNORMAL HIGH (ref 6–20)
CO2: 24 mmol/L (ref 22–32)
Calcium: 8.1 mg/dL — ABNORMAL LOW (ref 8.9–10.3)
Chloride: 111 mmol/L (ref 98–111)
Creatinine, Ser: 0.47 mg/dL (ref 0.44–1.00)
GFR, Estimated: >60 mL/min (ref >60)
Glucose, Bld: 81 mg/dL (ref 70–99)
Potassium: 4.4 mmol/L (ref 3.5–5.1)
Sodium: 140 mmol/L (ref 135–145)
Total Bilirubin: 0.4 mg/dL (ref 0.3–1.2)
Total Protein: 6.1 g/dL — ABNORMAL LOW (ref 6.5–8.1)

## 2021-10-08 MED ORDER — ADULT MULTIVITAMIN W/MINERALS CH
1.0000 | ORAL_TABLET | Freq: Two times a day (BID) | ORAL | Status: DC
  Administered 2021-10-08 – 2021-10-12 (×6): 1 via ORAL
  Filled 2021-10-08 (×6): qty 1

## 2021-10-08 NOTE — Progress Notes (Signed)
Pt transferred to ICU on biPAP with RT and rapid response RN. Belongings with pt.

## 2021-10-08 NOTE — Progress Notes (Signed)
PROGRESS NOTE    Terri [name]  XHB:[id number] DOB: [birth year] DOA: 08/31/2021 PCP: Pcp, No    Brief Narrative:  46 year old with history of recurrent GI bleeding, chronic microcytic anemia, hypertension, polysubstance abuse, Roux-en-Y surgery initially admitted to hospital with melanotic stool with history of chronic GI bleeding secondary to peptic ulcers.  Underwent endoscopy with large anastomotic ulceration noted, started on PPI and stabilized. While she was in the hospital found to have MSSA bacteremia with dissemination.  Her left great toe was gangrenous, underwent amputation.  Culture from the great toe grew MRSA. Patient was also found with multiple liver abscesses, has history of portal vein thrombosis. 4/28, transferred to ICU with increasing oxygen demand.  4/30, intubated-5/5 extubated and transferred to medical floor.  Esophageal candidiasis, finished Diflucan. MRSA pneumonia, grade 2 osteomyelitis status post amputation-treated with vancomycin and subsequently Teflaro Polymicrobial hepatic abscess, MSSA bacteremia-currently on Unasyn with plan until 5/31. Unable to do TEE.  Repeat echocardiogram with poor visualization of cardiac valves.  Treating as presumed endocarditis.  5/18, early morning hours patient had more shortness of breath, transiently started on BiPAP and transferred to stepdown unit.  Stabilized on BiPAP.  No new findings on chest x-ray or blood gas analysis.   Assessment & Plan:   Acute hypoxemic and hypercapnic respiratory failure secondary to MRSA pneumonia: Intubated- extubated.  Completed antibiotic therapy. 5/18, decompensated on BiPAP.  Already improving.  Continue aggressive chest physiotherapy and mobility. We will keep on stepdown unit today, BiPAP as needed.  Wean to nasal cannula oxygen or to room air. Deconditioning and frailty, unable to keep up secretions possibly decompensated heart condition.  Polymicrobial liver abscess, MSSA hepatic  abscess with disseminated infection: Unasyn until 5/31 Cardiology recommended too risky to perform TEE, repeat TTE with no evidence of endocarditis, however poor visual. Planning to finish 6 weeks of IV antibiotics until 5/29 Bacteremia cleared. Source removed, left great toe. Left foot disarticulation done on 4/26, Ortho recommended suture removal in 3-4 weeks.  Not ready yet.  Hypertensive urgency, essential hypertension: Blood pressures at goal now currently on amlodipine 10 mg, clonidine 0.1 mg/ 24 hours patch, hydralazine 50 mg twice daily.  Blood pressure stable now.  Acute metabolic infective encephalopathy: Resolved.  Mental status improved.  Acute GI bleeding with acute blood loss anemia: Presentation hemoglobin 4.6.  Received 6 units of PRBC.  Hemoglobin is stabilized.  Will monitor closely.  On PPI.  Severe protein calorie malnutrition, failure to thrive: Initially failed swallow evaluation, core track unable to be advanced by IR due to difficult gastric anatomy and very small gastric pouch 5/8, started on TPN due to very poor oral intake Plan, Marinol 5 mg twice daily Nutritional supplements Advance to regular diet and hope she can keep up.  Calorie count.  Pain management: Patient on IV fentanyl and oral pain medications, lidocaine. Maintain on oral pain medications with hydrocodone, Tylenol and lidocaine patch.      DVT prophylaxis: heparin injection 5,000 Units Start: 09/30/21 2230 Place and maintain sequential compression device Start: 09/22/21 0443 Place and maintain sequential compression device Start: 09/11/21 2114 SCDs Start: 08/31/21 1939   Code Status: Full code.  Seen by palliative. Family Communication: None today. Disposition Plan: Status is: Inpatient Remains inpatient appropriate because: Unsafe discharge disposition plan.  No oral intake.     Consultants:  Multiple. Critical care, palliative care, gastroenterology  Procedures:  Multiple  procedures as above.  Antimicrobials:  Multiple medications.  Currently on Unasyn until 5/29.   Subjective:  Patient seen and examined.  Overnight events noted.  Transferred to ICU with respiratory distress.  Currently stabilized but remains on BiPAP.  Patient denies any complaints.  She tells me she is doing okay.  She was hungry.  Objective: Vitals:   10/08/21 0830 10/08/21 0857 10/08/21 0900 10/08/21 1000  BP:   (!) 144/96 122/87  Pulse:   87 98  Resp:   20 (!) 25  Temp:      TempSrc:      SpO2: 100% 95% 100% 100%  Weight:      Height:        Intake/Output Summary (Last 24 hours) at 10/08/2021 1106 Last data filed at 10/08/2021 0849 Gross per 24 hour  Intake 844.41 ml  Output 600 ml  Net 244.41 ml   Filed Weights   10/04/21 0500 10/06/21 0508 10/08/21 0331  Weight: 49.4 kg 51.1 kg 47.8 kg    Examination:  General: Thin and cachectic.  Chronically sick looking.  Frail and debilitated.  Ill-appearing.  On BiPAP. Cardiovascular: S1-S2 normal.  Regular rate rhythm. Respiratory: Bilateral clear.  On room air.  Occasionally tachypneic. Gastrointestinal: Soft.  Nontender.  Bowel sound present. Ext: Cachectic extremities.  No edema no swelling. Neuro: Alert and awake.  Oriented x3-4.  Generalized weakness.  No acute deficits. Musculoskeletal: No deformities. Skin:   Pressure Injury 09/24/21 Sacrum Medial Deep Tissue Pressure Injury - Purple or maroon localized area of discolored intact skin or blood-filled blister due to damage of underlying soft tissue from pressure and/or shear. several deep tissue injuries, dark  (Active)  09/24/21 1100  Location: Sacrum  Location Orientation: Medial  Staging: Deep Tissue Pressure Injury - Purple or maroon localized area of discolored intact skin or blood-filled blister due to damage of underlying soft tissue from pressure and/or shear.  Wound Description (Comments): several deep tissue injuries, dark purple, non-blanchable  Present on  Admission: No     Pressure Injury 09/24/21 Buttocks Left;Posterior;Proximal Deep Tissue Pressure Injury - Purple or maroon localized area of discolored intact skin or blood-filled blister due to damage of underlying soft tissue from pressure and/or shear. several purple,  (Active)  09/24/21 1100  Location: Buttocks (gluteal fold)  Location Orientation: Left;Posterior;Proximal  Staging: Deep Tissue Pressure Injury - Purple or maroon localized area of discolored intact skin or blood-filled blister due to damage of underlying soft tissue from pressure and/or shear.  Wound Description (Comments): several purple, non-blanchable  Present on Admission: No     Pressure Injury 09/24/21 Vertebral column Lower Deep Tissue Pressure Injury - Purple or maroon localized area of discolored intact skin or blood-filled blister due to damage of underlying soft tissue from pressure and/or shear. spinal column deep tissue, (Active)  09/24/21 1100  Location: Vertebral column  Location Orientation: Lower  Staging: Deep Tissue Pressure Injury - Purple or maroon localized area of discolored intact skin or blood-filled blister due to damage of underlying soft tissue from pressure and/or shear.  Wound Description (Comments): spinal column deep tissue, purple, non-blanchable  Present on Admission: No     Pressure Injury 09/24/21 Vertebral column Right;Upper Deep Tissue Pressure Injury - Purple or maroon localized area of discolored intact skin or blood-filled blister due to damage of underlying soft tissue from pressure and/or shear. purple, non-blanchab (Active)  09/24/21 1100  Location: Vertebral column  Location Orientation: Right;Upper  Staging: Deep Tissue Pressure Injury - Purple or maroon localized area of discolored intact skin or blood-filled blister due to damage of underlying soft tissue from  pressure and/or shear.  Wound Description (Comments): purple, non-blanchable  Present on Admission: No         Data Reviewed: I have personally reviewed following labs and imaging studies  CBC: Recent Labs  Lab 10/04/21 0414 10/05/21 0337 10/06/21 0434 10/07/21 0321 10/08/21 0244  WBC 3.2* 2.9* 3.7* 3.9* 6.9  NEUTROABS 2.1 1.7 2.3 2.3 5.4  HGB 7.2* 7.0* 8.1* 7.8* 8.1*  HCT 21.9* 21.0* 24.5* 23.6* 24.9*  MCV 79.9* 81.1 79.0* 79.7* 81.1  PLT 102* 107* 128* 133* 301   Basic Metabolic Panel: Recent Labs  Lab 10/03/21 0500 10/04/21 0414 10/05/21 0337 10/06/21 0434 10/07/21 0321 10/08/21 0244  NA 131* 135 132* 135 139 140  K 3.7 4.2 4.3 4.4 4.4 4.4  CL 103 106 105 106 110 111  CO2 '23 23 22 24 24 24  '$ GLUCOSE 85 80 76 88 87 81  BUN 22* 23* 20 25* 30* 28*  CREATININE <0.30* <0.30* 0.34* 0.41* 0.38* 0.47  CALCIUM 8.0* 8.3* 7.9* 7.9* 8.2* 8.1*  MG 1.9 1.9 1.7 2.0 2.0  --   PHOS 3.1 3.4 4.1 4.7* 4.2  --    GFR: Estimated Creatinine Clearance: 65.6 mL/min (by C-G formula based on SCr of 0.47 mg/dL). Liver Function Tests: Recent Labs  Lab 10/03/21 0500 10/04/21 0414 10/05/21 0337 10/07/21 0321 10/08/21 0244  AST '27 27 26 28 31  '$ ALT '21 23 23 28 '$ 32  ALKPHOS 122 122 115 127* 137*  BILITOT 0.4 0.4 0.6 0.6 0.4  PROT 5.7* 5.5* 5.4* 5.8* 6.1*  ALBUMIN 2.1* 2.1* 2.0* 2.1* 2.2*   No results for input(s): LIPASE, AMYLASE in the last 168 hours. No results for input(s): AMMONIA in the last 168 hours. Coagulation Profile: No results for input(s): INR, PROTIME in the last 168 hours. Cardiac Enzymes: No results for input(s): CKTOTAL, CKMB, CKMBINDEX, TROPONINI in the last 168 hours. BNP (last 3 results) No results for input(s): PROBNP in the last 8760 hours. HbA1C: No results for input(s): HGBA1C in the last 72 hours. CBG: Recent Labs  Lab 10/03/21 0727 10/03/21 1211 10/03/21 1546 10/05/21 0301 10/07/21 1645  GLUCAP 122* 114* 103* 82 86   Lipid Profile: No results for input(s): CHOL, HDL, LDLCALC, TRIG, CHOLHDL, LDLDIRECT in the last 72 hours.  Thyroid Function  Tests: No results for input(s): TSH, T4TOTAL, FREET4, T3FREE, THYROIDAB in the last 72 hours. Anemia Panel: No results for input(s): VITAMINB12, FOLATE, FERRITIN, TIBC, IRON, RETICCTPCT in the last 72 hours. Sepsis Labs: No results for input(s): PROCALCITON, LATICACIDVEN in the last 168 hours.  No results found for this or any previous visit (from the past 240 hour(s)).       Radiology Studies: DG CHEST PORT 1 VIEW  Result Date: 10/07/2021 CLINICAL DATA:  Respiratory compromise EXAM: PORTABLE CHEST 1 VIEW COMPARISON:  10/05/2021 FINDINGS: Cardiac shadow is stable. Right-sided PICC is again seen and stable. The lungs are hyperinflated. Patchy airspace opacity is noted in the bases bilaterally consistent with atelectasis. No bony abnormality is noted. IMPRESSION: Slight increase in basilar atelectasis. Electronically Signed   By: Inez Catalina M.D.   On: 10/07/2021 22:47   ECHOCARDIOGRAM COMPLETE  Result Date: 10/06/2021    ECHOCARDIOGRAM REPORT   Patient Name:   Terri [name] Date of Exam: 10/06/2021 Medical Rec #:  [id number]     Height:       69.0 in Accession #:    [id number]    Weight:       112.7 lb Date of  Birth:  14-Nov-1975     BSA:          1.619 m Patient Age:    21 years      BP:           120/90 mmHg Patient Gender: F             HR:           87 bpm. Exam Location:  Inpatient Procedure: 2D Echo, Cardiac Doppler and Color Doppler Indications:    Endocarditis  History:        Patient has prior history of Echocardiogram examinations, most                 recent 09/09/2021. Risk Factors:Hypertension.  Sonographer:    Joette Catching RCS Referring Phys: [id number] Nira Conn E [place]  Sonographer Comments: Technically challenging study due to limited acoustic windows. Image acquisition challenging due to patient body habitus. IMPRESSIONS  1. Left ventricular ejection fraction, by estimation, is 60 to 65%. The left ventricle has normal function. The left ventricle has no regional wall motion  abnormalities. Left ventricular diastolic parameters are consistent with Grade I diastolic dysfunction (impaired relaxation).  2. Right ventricular systolic function is normal. The right ventricular size is normal.  3. The mitral valve is normal in structure. Trivial mitral valve regurgitation. No evidence of mitral stenosis.  4. The aortic valve is normal in structure. There is mild calcification of the aortic valve. Aortic valve regurgitation is trivial. Aortic valve sclerosis/calcification is present, without any evidence of aortic stenosis.  5. The inferior vena cava is normal in size with greater than 50% respiratory variability, suggesting right atrial pressure of 3 mmHg. Comparison(s): No significant change from prior study. Prior images reviewed side by side. Conclusion(s)/Recommendation(s): No evidence of valvular vegetations on this transthoracic echocardiogram. Consider a transesophageal echocardiogram to exclude infective endocarditis if clinically indicated. FINDINGS  Left Ventricle: Left ventricular ejection fraction, by estimation, is 60 to 65%. The left ventricle has normal function. The left ventricle has no regional wall motion abnormalities. The left ventricular internal cavity size was normal in size. There is  no left ventricular hypertrophy. Left ventricular diastolic parameters are consistent with Grade I diastolic dysfunction (impaired relaxation). Normal left ventricular filling pressure. Right Ventricle: The right ventricular size is normal. No increase in right ventricular wall thickness. Right ventricular systolic function is normal. Left Atrium: Left atrial size was normal in size. Right Atrium: Right atrial size was normal in size. Pericardium: There is no evidence of pericardial effusion. Mitral Valve: The mitral valve is normal in structure. Trivial mitral valve regurgitation. No evidence of mitral valve stenosis. Tricuspid Valve: The tricuspid valve is normal in structure. Tricuspid  valve regurgitation is trivial. No evidence of tricuspid stenosis. Aortic Valve: The aortic valve is normal in structure. There is mild calcification of the aortic valve. Aortic valve regurgitation is trivial. Aortic regurgitation PHT measures 637 msec. Aortic valve sclerosis/calcification is present, without any evidence of aortic stenosis. Aortic valve mean gradient measures 4.0 mmHg. Aortic valve peak gradient measures 6.0 mmHg. Aortic valve area, by VTI measures 1.86 cm. Pulmonic Valve: The pulmonic valve was normal in structure. Pulmonic valve regurgitation is not visualized. No evidence of pulmonic stenosis. Aorta: The aortic root is normal in size and structure. Venous: The inferior vena cava is normal in size with greater than 50% respiratory variability, suggesting right atrial pressure of 3 mmHg. IAS/Shunts: No atrial level shunt detected by color flow Doppler.  LEFT VENTRICLE PLAX  2D LVIDd:         4.20 cm     Diastology LVIDs:         3.30 cm     LV e' medial:    8.27 cm/s LV PW:         0.80 cm     LV E/e' medial:  6.0 LV IVS:        0.80 cm     LV e' lateral:   9.90 cm/s LVOT diam:     2.00 cm     LV E/e' lateral: 5.0 LV SV:         44 LV SV Index:   27 LVOT Area:     3.14 cm  LV Volumes (MOD) LV vol d, MOD A2C: 65.0 ml LV vol d, MOD A4C: 76.0 ml LV vol s, MOD A2C: 34.9 ml LV vol s, MOD A4C: 36.9 ml LV SV MOD A2C:     30.1 ml LV SV MOD A4C:     76.0 ml LV SV MOD BP:      35.2 ml RIGHT VENTRICLE RV Basal diam:  3.10 cm RV S prime:     10.20 cm/s TAPSE (M-mode): 1.3 cm LEFT ATRIUM           Index        RIGHT ATRIUM          Index LA diam:      3.40 cm 2.10 cm/m   RA Area:     9.74 cm LA Vol (A4C): 26.1 ml 16.12 ml/m  RA Volume:   18.80 ml 11.61 ml/m  AORTIC VALVE                    PULMONIC VALVE AV Area (Vmax):    2.02 cm     PV Vmax:       0.77 m/s AV Area (Vmean):   2.09 cm     PV Peak grad:  2.4 mmHg AV Area (VTI):     1.86 cm AV Vmax:           122.00 cm/s AV Vmean:          94.000 cm/s AV  VTI:            0.237 m AV Peak Grad:      6.0 mmHg AV Mean Grad:      4.0 mmHg LVOT Vmax:         78.30 cm/s LVOT Vmean:        62.400 cm/s LVOT VTI:          0.140 m LVOT/AV VTI ratio: 0.59 AI PHT:            637 msec  AORTA Ao Root diam: 3.10 cm Ao Asc diam:  3.10 cm MITRAL VALVE               TRICUSPID VALVE MV Area (PHT): 7.44 cm    TR Peak grad:   36.7 mmHg MV Decel Time: 102 msec    TR Vmax:        303.00 cm/s MV E velocity: 49.70 cm/s MV A velocity: 77.60 cm/s  SHUNTS MV E/A ratio:  0.64        Systemic VTI:  0.14 m                            Systemic Diam: 2.00 cm Dani Gobble Croitoru MD Electronically signed by Sanda Klein  MD Signature Date/Time: 10/06/2021/4:02:15 PM    Final         Scheduled Meds:  amLODipine  10 mg Oral Daily   calcium carbonate  1 tablet Oral TID   chlorhexidine  15 mL Mouth Rinse BID   Chlorhexidine Gluconate Cloth  6 each Topical Daily   cholecalciferol  1,000 Units Oral Daily   cloNIDine  0.1 mg Transdermal Weekly   dronabinol  5 mg Oral BID AC   feeding supplement  237 mL Oral TID BM   folic acid  1 mg Oral Daily   guaiFENesin  5 mL Oral Q6H   heparin injection (subcutaneous)  5,000 Units Subcutaneous Q8H   hydrALAZINE  50 mg Oral BID   ipratropium-albuterol  3 mL Nebulization BID   lidocaine  1 patch Transdermal Daily   lisinopril  10 mg Oral QHS   mouth rinse  15 mL Mouth Rinse q12n4p   multivitamin with minerals  1 tablet Oral Daily   nicotine  21 mg Transdermal Daily   nutrition supplement (JUVEN)  1 packet Oral BID BM   pantoprazole  40 mg Oral BID   sodium chloride flush  10-40 mL Intracatheter Q12H   sucralfate  1 g Oral TID WC & HS   thiamine  100 mg Oral Daily   Continuous Infusions:  sodium chloride 10 mL/hr at 09/30/21 0800   sodium chloride Stopped (09/30/21 1352)   ampicillin-sulbactam (UNASYN) IV 3 g (10/08/21 0946)     LOS: 37 days    Time spent: 40 minutes    Barb Merino, MD [facility] Pager [phone]

## 2021-10-08 NOTE — Progress Notes (Addendum)
Nutrition Follow-up  DOCUMENTATION CODES:   Severe malnutrition in context of chronic illness, Underweight  INTERVENTION:   -Ensure Plus High Protein po TID, each supplement provides 350 kcal and 20 grams of protein.   -Juven BID, each serving provides 95kcal and 2.5g of protein (amino acids glutamine and arginine)   -Multivitamin with minerals daily x 2  -Continue Tums TID  -Encouraged PO intake   NUTRITION DIAGNOSIS:   Severe Malnutrition related to chronic illness as evidenced by severe fat depletion, severe muscle depletion.  Ongoing.  GOAL:   Patient will meet greater than or equal to 90% of their needs  Progressing.  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 This admission EGD showed esophageal plaques consistent with candidiasis, large cratered nonbleeding duodenal ulcer without bleeding.  CT angiogram on 08/31/21 showed multiple low-attenuation lesions throughout the liver, highly concerning for hepatic abscesses or metastasis.  Now s/p IR US guided bx and aspiration, revealing MSSA abscess.  Significant Events: 4/10- admission 4/13- EGD 4/17- liver biopsy 4/23- thoracentesis with 1.2 L removed 4/27- initial RD assessment; L great toe amputation at first metatarsophalangeal joint; L forefoot I&D 4/30- re-intubated; small bore NGT placed (side port does not appear to have been able to be passed beyond the GE junction; triple lumen PICC placed in R basilic  5/5- extubated; small bore NGT removed 5/7- small bore NGT placed at bedside 5/8- small bore NGT unable to be advanced beyond GE junction by floor staff and by  IR staff; removed 5/9- MBS; diet advanced from NPO to FLD, thin liquids; TPN initiation 5/17- TPN d/c 5/18- SLP recommends regular diet  Patient in room with SO at bedside. Pt was transferred to ICU this morning d/t respiratory distress. Pt reports eating some cereal  with milk this morning for breakfast. Currently drinking an Ensure.  Last night she ate a slice of pizza and drank 3 Ensures yesterday. For breakfast yesterday she had a few bites of an omelet.  Will continue current supplement orders. Will increase MVI to BID now off TPN.  Admission weight: 117 lbs Current weight: 105 lbs  Medications: Tums TID, Vitamin D, Marinol, Folic acid, Multivitamin with minerals daily x 2, Carafate, Thiamine  Labs reviewed: CBGs: 82-86   Diet Order:   Diet Order             Diet regular Room service appropriate? Yes; Fluid consistency: Thin  Diet effective now                   EDUCATION NEEDS:   Education needs have been addressed  Skin:  Skin Assessment: Skin Integrity Issues: Skin Integrity Issues:: DTI, Stage II DTI: sacrum; L buttocks; vertebral column x2 Stage II: right buttocks Other: left forefoot abscess, left great toe osteomyelitis  Last BM:  5/18- type 7  Height:   Ht Readings from Last 1 Encounters:  09/18/21 '5\' 9"'$  (1.753 m)    Weight:   Wt Readings from Last 1 Encounters:  10/08/21 47.8 kg    BMI:  Body mass index is 15.56 kg/m.  Estimated Nutritional Needs:   Kcal:  2150-2350 kcal  Protein:  100-115 grams  Fluid:  >/= 2.2 L/day   Clayton Bibles,  MS, RD, LDN Inpatient Clinical Dietitian Contact information available via Amion

## 2021-10-08 NOTE — Progress Notes (Signed)
CSW updated for Pt PASSAR  [id number]

## 2021-10-08 NOTE — NC FL2 (Signed)
[place] LEVEL OF CARE SCREENING TOOL     IDENTIFICATION  Patient Name: Terri [name] Birthdate: 1975/12/19 Sex: female Admission Date (Current Location): 08/31/2021  [facility] and [place] Number:  Herbalist and Address:  [facility],  [place], [place]      Provider Number: [id number]  Attending Physician Name and Address:  Barb Merino, MD  Relative Name and Phone Number:       Current Level of Care: Hospital Recommended Level of Care: [place] Prior Approval Number:    Date Approved/Denied:   PASRR Number: pending  Discharge Plan: SNF    Current Diagnoses: Patient Active Problem List   Diagnosis Date Noted   Pressure injury of skin 10/08/2021   Drug-seeking behavior 10/06/2021   Goals of care, counseling/discussion    Protein-calorie malnutrition, severe 09/21/2021   Involuntary commitment 09/19/2021   Acute respiratory failure with hypoxia ([place]) 09/15/2021   Dysuria 09/15/2021   Vaginal discharge 09/14/2021   Exudative pleural effusion 09/12/2021   Hypophosphatemia 09/12/2021   Ascites 09/11/2021   Hepatic abscess    Acute back pain    Swelling of left hand    Disseminated MSSA with liver abscess, left foot osteo and abscess, 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    Candida infection, esophageal ([place]) 09/03/2021   Portal vein thrombosis 08/31/2021   Community acquired pneumonia 07/08/2021   Homelessness 07/08/2021   Multilobar pneumonia with acute hypoxic respiratory failure ([place])    Cellulitis of right lower extremity 05/28/2021   Vitamin B12 deficiency 05/24/2021   Cellulitis 05/22/2021   COVID-19 virus infection 05/22/2021   Overdose 04/13/2021   Hypoxemia 04/13/2021   Hypoglycemia 04/13/2021   Asthma exacerbation 04/01/2021   Acute on chronic blood loss anemia 04/01/2021   Iron deficiency anemia 03/18/2021   Hyponatremia 03/18/2021   Marginal ulcer 03/15/2021   Microcytic anemia  02/26/2021   Mild protein malnutrition ([place]) 02/26/2021   Polysubstance abuse ([place]) 01/16/2021   Abdominal pain 01/14/2021   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   Persistent fever 02/09/2019   COVID-19 virus not detected    Chest pain    Bacteremia due to Escherichia coli    Esophagitis    Hypokalemia    Hypomagnesemia    Sepsis ([place]) 02/02/2019   Leucocytosis 02/02/2019   Thrombocytosis 02/02/2019   Acute lower UTI 02/02/2019   Apnea 09/13/2017   Essential hypertension    Cellulitis and abscess of neck 08/12/2017   Cellulitis of submandibular region 08/10/2017   Odontogenic infection of jaw 08/10/2017   Cholecystitis 02/14/2017   Opioid abuse with opioid-induced mood disorder ([place]) 01/25/2017   Anxiety 02/11/2014   Depression 07/12/2012    Orientation RESPIRATION BLADDER Height & Weight     Self, Place  Normal Incontinent Weight: 47.8 kg Height:  '5\' 9"'$  (175.3 cm)  BEHAVIORAL SYMPTOMS/MOOD NEUROLOGICAL BOWEL NUTRITION STATUS      Incontinent Diet  AMBULATORY STATUS COMMUNICATION OF NEEDS Skin   Extensive Assist Verbally Normal  Personal Care Assistance Level of Assistance  Bathing, Feeding, Dressing Bathing Assistance: Limited assistance Feeding assistance: Limited assistance Dressing Assistance: Limited assistance     Functional Limitations [place]  PT (By licensed PT), OT (By licensed OT)     PT Frequency: 5x weekly OT  Frequency: 5x weekly            Contractures Contractures Info: Not present    Additional Factors Info  Code Status, Allergies Code Status Info: Full Allergies Info: NSAIDS, Ketorolac           Current Medications (10/08/2021):  This is the current hospital active medication list Current Facility-Administered Medications  Medication Dose Route Frequency Provider Last Rate Last Admin   0.9 %  sodium chloride infusion  250 mL Intravenous Continuous Mauri Brooklyn, MD 10 mL/hr at 09/30/21 0800 Infusion Verify at 09/30/21 0800   0.9 %  sodium chloride infusion   Intravenous PRN Regalado, Belkys A, MD   Paused at 09/30/21 1352   acetaminophen (TYLENOL) 160 MG/5ML solution 650 mg  650 mg Per Tube Q6H PRN Freddi Starr, MD   650 mg at 10/07/21 1608   amLODipine (NORVASC) tablet 10 mg  10 mg Oral Daily Allie Bossier, MD   10 mg at 10/08/21 0836   Ampicillin-Sulbactam (UNASYN) 3 g in sodium chloride 0.9 % 100 mL IVPB  3 g Intravenous Q6H Jule Ser N, DO 200 mL/hr at 10/08/21 1441 3 g at 10/08/21 1441   calcium carbonate (TUMS - dosed in mg elemental calcium) chewable tablet 200 mg of elemental calcium  1 tablet Oral TID Allie Bossier, MD   200 mg of elemental calcium at 10/08/21 0834   chlorhexidine (PERIDEX) 0.12 % solution 15 mL  15 mL Mouth Rinse BID Barb Merino, MD   15 mL at 10/08/21 2297   Chlorhexidine Gluconate Cloth 2 % PADS 6 each  6 each Topical Daily Brand Males, MD   6 each at 10/07/21 1015   cholecalciferol (VITAMIN D3) tablet 1,000 Units  1,000 Units Oral Daily Brand Males, MD   1,000 Units at 10/08/21 9892   cloNIDine (CATAPRES - Dosed in mg/24 hr) patch 0.1 mg  0.1 mg Transdermal Weekly Erick Colace, NP   0.1 mg at 10/02/21 0934   dextrose 50 % solution 50 mL  50 mL Intravenous Q15 min PRN Samuella Cota, MD   25 mL at 09/29/21 1613   dronabinol (MARINOL) capsule 5 mg  5 mg Oral BID AC Allie Bossier, MD   5 mg at 10/08/21 1235   feeding  supplement (ENSURE ENLIVE / ENSURE PLUS) liquid 237 mL  237 mL Oral TID BM Allie Bossier, MD   237 mL at 11/94/17 4081   folic acid (FOLVITE) tablet 1 mg  1 mg Oral Daily Leodis Sias T, RPH   1 mg at 10/08/21 0834   guaiFENesin (ROBITUSSIN) 100 MG/5ML liquid 5 mL  5 mL Oral Q6H Allie Bossier, MD   5 mL at 10/08/21 1235   guaiFENesin-dextromethorphan (ROBITUSSIN DM) 100-10 MG/5ML syrup 10 mL  10 mL Oral Q4H PRN Barb Merino, MD       haloperidol lactate (HALDOL) injection 5 mg  5 mg Intravenous BID PRN Hollace Hayward K, NP   5 mg at 09/23/21 0715   heparin injection 5,000 Units  5,000 Units Subcutaneous Q8H Shade, Christine E, RPH   5,000  Units at 10/08/21 1441   hydrALAZINE (APRESOLINE) injection 10 mg  10 mg Intravenous Q4H PRN Anders Simmonds, MD   10 mg at 10/01/21 2316   hydrALAZINE (APRESOLINE) tablet 50 mg  50 mg Oral BID Allie Bossier, MD   50 mg at 10/08/21 0836   HYDROcodone-acetaminophen (HYCET) 7.5-325 mg/15 ml solution 10 mL  10 mL Oral Q6H PRN Allie Bossier, MD   10 mL at 10/07/21 2046   ipratropium-albuterol (DUONEB) 0.5-2.5 (3) MG/3ML nebulizer solution 3 mL  3 mL Nebulization Q6H PRN Allie Bossier, MD   3 mL at 10/07/21 2158   ipratropium-albuterol (DUONEB) 0.5-2.5 (3) MG/3ML nebulizer solution 3 mL  3 mL Nebulization BID Allie Bossier, MD   3 mL at 10/08/21 0857   lidocaine (LIDODERM) 5 % 1 patch  1 patch Transdermal Daily Samuella Cota, MD   1 patch at 10/08/21 0835   lip balm (CARMEX) ointment   Topical PRN Samuella Cota, MD   75 application. at 10/08/21 0835   lisinopril (ZESTRIL) tablet 10 mg  10 mg Oral QHS Allie Bossier, MD   10 mg at 10/07/21 2117   loperamide HCl (IMODIUM) 1 MG/7.5ML suspension 2 mg  2 mg Oral PRN Barb Merino, MD       MEDLINE mouth rinse  15 mL Mouth Rinse q12n4p Barb Merino, MD   15 mL at 10/08/21 1238   multivitamin with minerals tablet 1 tablet  1 tablet Oral BID WC Barb Merino, MD       naloxone [facility]) injection  0.4 mg  0.4 mg Intravenous PRN Samuella Cota, MD       nicotine (NICODERM CQ - dosed in mg/24 hours) patch 21 mg  21 mg Transdermal Daily Samuella Cota, MD   21 mg at 10/08/21 4917   nutrition supplement (JUVEN) (JUVEN) powder packet 1 packet  1 packet Oral BID BM Allie Bossier, MD   1 packet at 10/08/21 1235   ondansetron (ZOFRAN) injection 4 mg  4 mg Intravenous Q6H PRN Samuella Cota, MD   4 mg at 09/13/21 1546   pantoprazole (PROTONIX) EC tablet 40 mg  40 mg Oral BID Leodis Sias T, RPH   40 mg at 10/08/21 0833   saline (AYR) nasal gel with aloe 1 application.  1 application. Each Nare Q4H PRN Samuella Cota, MD   1 application. at 09/19/21 1751   sodium chloride flush (NS) 0.9 % injection 10-40 mL  10-40 mL Intracatheter Q12H Freda Jackson B, MD   10 mL at 10/08/21 0849   sodium chloride flush (NS) 0.9 % injection 10-40 mL  10-40 mL Intracatheter PRN Freddi Starr, MD       sucralfate (CARAFATE) 1 GM/10ML suspension 1 g  1 g Oral TID WC & HS Allie Bossier, MD   1 g at 10/08/21 1235   thiamine tablet 100 mg  100 mg Oral Daily Eudelia Bunch, RPH   100 mg at 10/08/21 9150     Discharge Medications: Please see discharge summary for a list of discharge medications.  Relevant Imaging Results:  Relevant Lab Results:   Additional Information SSN [id number]  Joaquin Courts, RN

## 2021-10-08 NOTE — Progress Notes (Signed)
Report given to ICU RN via telephone.

## 2021-10-09 LAB — COMPREHENSIVE METABOLIC PANEL
ALT: 27 U/L (ref 0–44)
AST: 27 U/L (ref 15–41)
Albumin: 2.1 g/dL — ABNORMAL LOW (ref 3.5–5.0)
Alkaline Phosphatase: 125 U/L (ref 38–126)
Anion gap: 4 — ABNORMAL LOW (ref 5–15)
BUN: 27 mg/dL — ABNORMAL HIGH (ref 6–20)
CO2: 22 mmol/L (ref 22–32)
Calcium: 7.9 mg/dL — ABNORMAL LOW (ref 8.9–10.3)
Chloride: 110 mmol/L (ref 98–111)
Creatinine, Ser: 0.48 mg/dL (ref 0.44–1.00)
GFR, Estimated: >60 mL/min (ref >60)
Glucose, Bld: 78 mg/dL (ref 70–99)
Potassium: 4 mmol/L (ref 3.5–5.1)
Sodium: 136 mmol/L (ref 135–145)
Total Bilirubin: 0.6 mg/dL (ref 0.3–1.2)
Total Protein: 5.9 g/dL — ABNORMAL LOW (ref 6.5–8.1)

## 2021-10-09 LAB — CBC WITH DIFFERENTIAL/PLATELET
Abs Immature Granulocytes: 0.01 10*3/uL (ref 0.00–0.07)
Basophils Absolute: 0.1 10*3/uL (ref 0.0–0.1)
Basophils Relative: 1 %
Eosinophils Absolute: 0.1 10*3/uL (ref 0.0–0.5)
Eosinophils Relative: 3 %
HCT: 25.1 % — ABNORMAL LOW (ref 36.0–46.0)
Hemoglobin: 8.1 g/dL — ABNORMAL LOW (ref 12.0–15.0)
Immature Granulocytes: 0 %
Lymphocytes Relative: 25 %
Lymphs Abs: 1.2 10*3/uL (ref 0.7–4.0)
MCH: 26.4 pg (ref 26.0–34.0)
MCHC: 32.3 g/dL (ref 30.0–36.0)
MCV: 81.8 fL (ref 80.0–100.0)
Monocytes Absolute: 0.4 10*3/uL (ref 0.1–1.0)
Monocytes Relative: 9 %
Neutro Abs: 2.8 10*3/uL (ref 1.7–7.7)
Neutrophils Relative %: 62 %
Platelets: 144 10*3/uL — ABNORMAL LOW (ref 150–400)
RBC: 3.07 MIL/uL — ABNORMAL LOW (ref 3.87–5.11)
RDW: 24.4 % — ABNORMAL HIGH (ref 11.5–15.5)
WBC: 4.6 10*3/uL (ref 4.0–10.5)
nRBC: 0 % (ref 0.0–0.2)

## 2021-10-09 IMAGING — DX DG CHEST 1V PORT
2 series · 2 of 2 positions shown · non-contrast
Comparison: Chest x-ray 09/17/2020.

CLINICAL DATA: 45-year-old female with history of fever and
worsening upper abdominal pain.

EXAM:
PORTABLE CHEST 1 VIEW

[chest ap (1 of 2)]
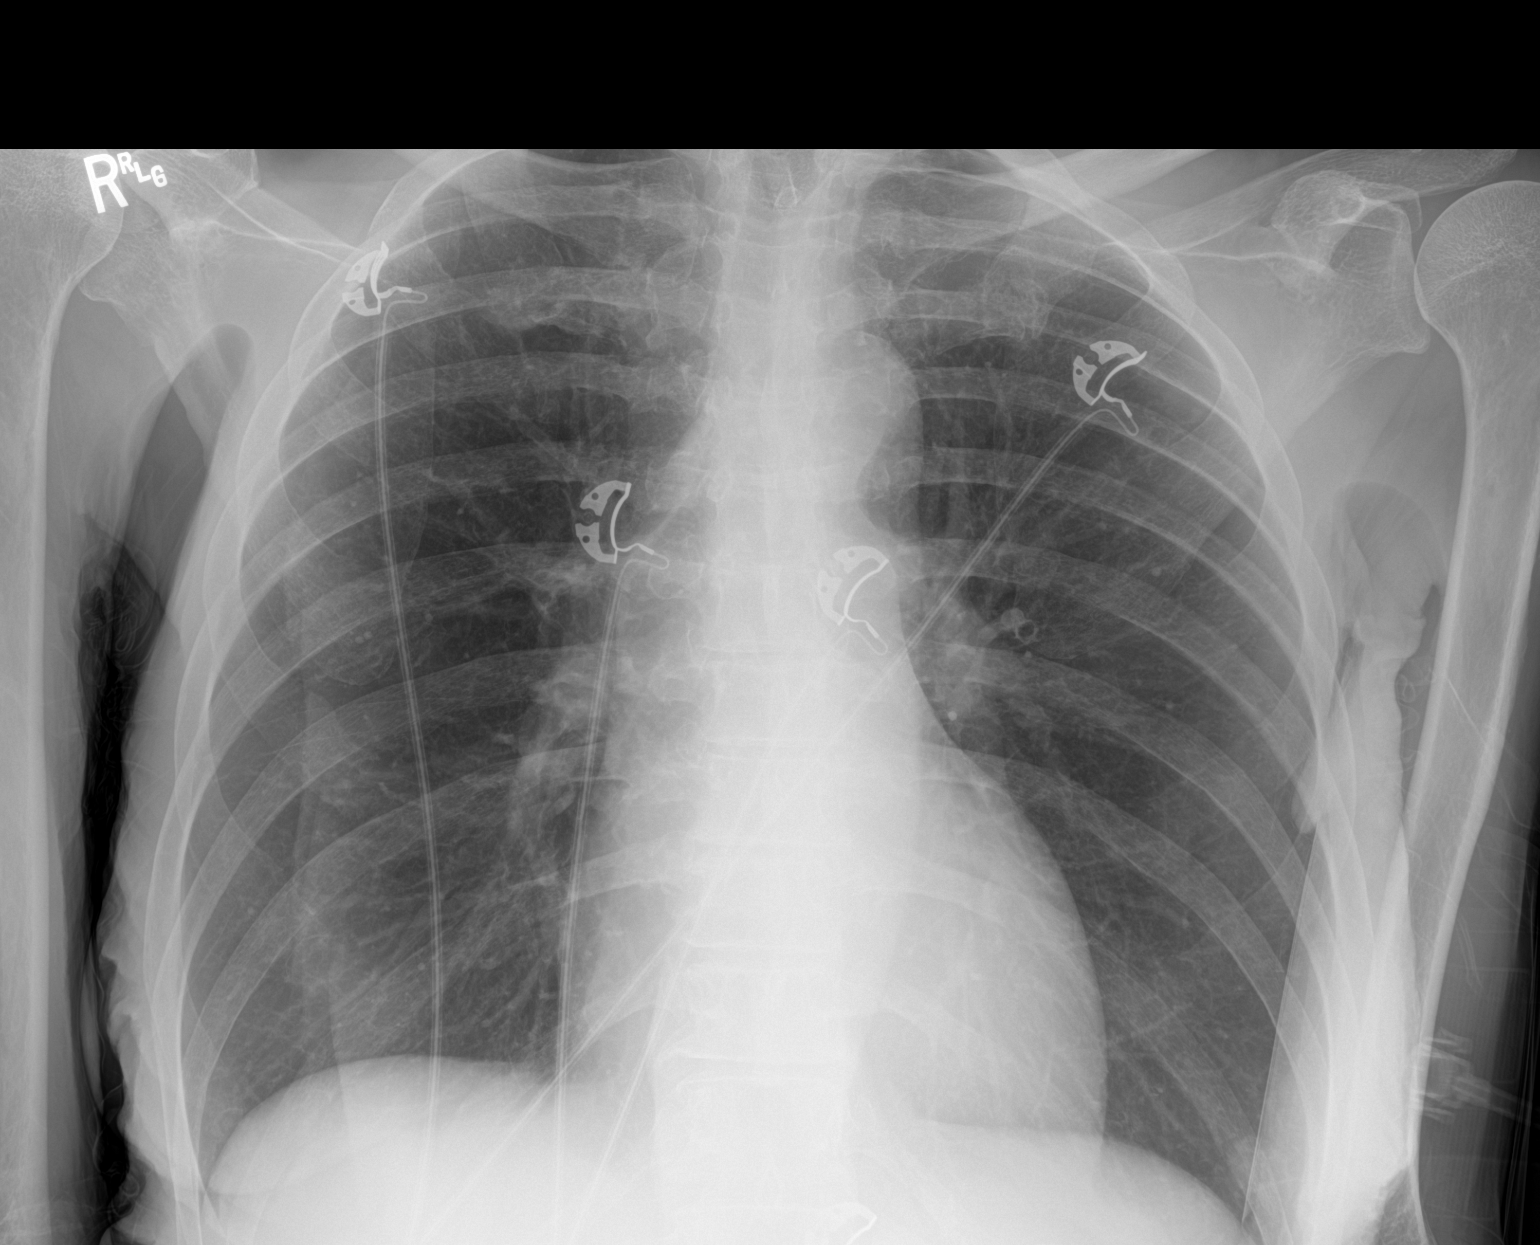

[chest ap (2 of 2)]
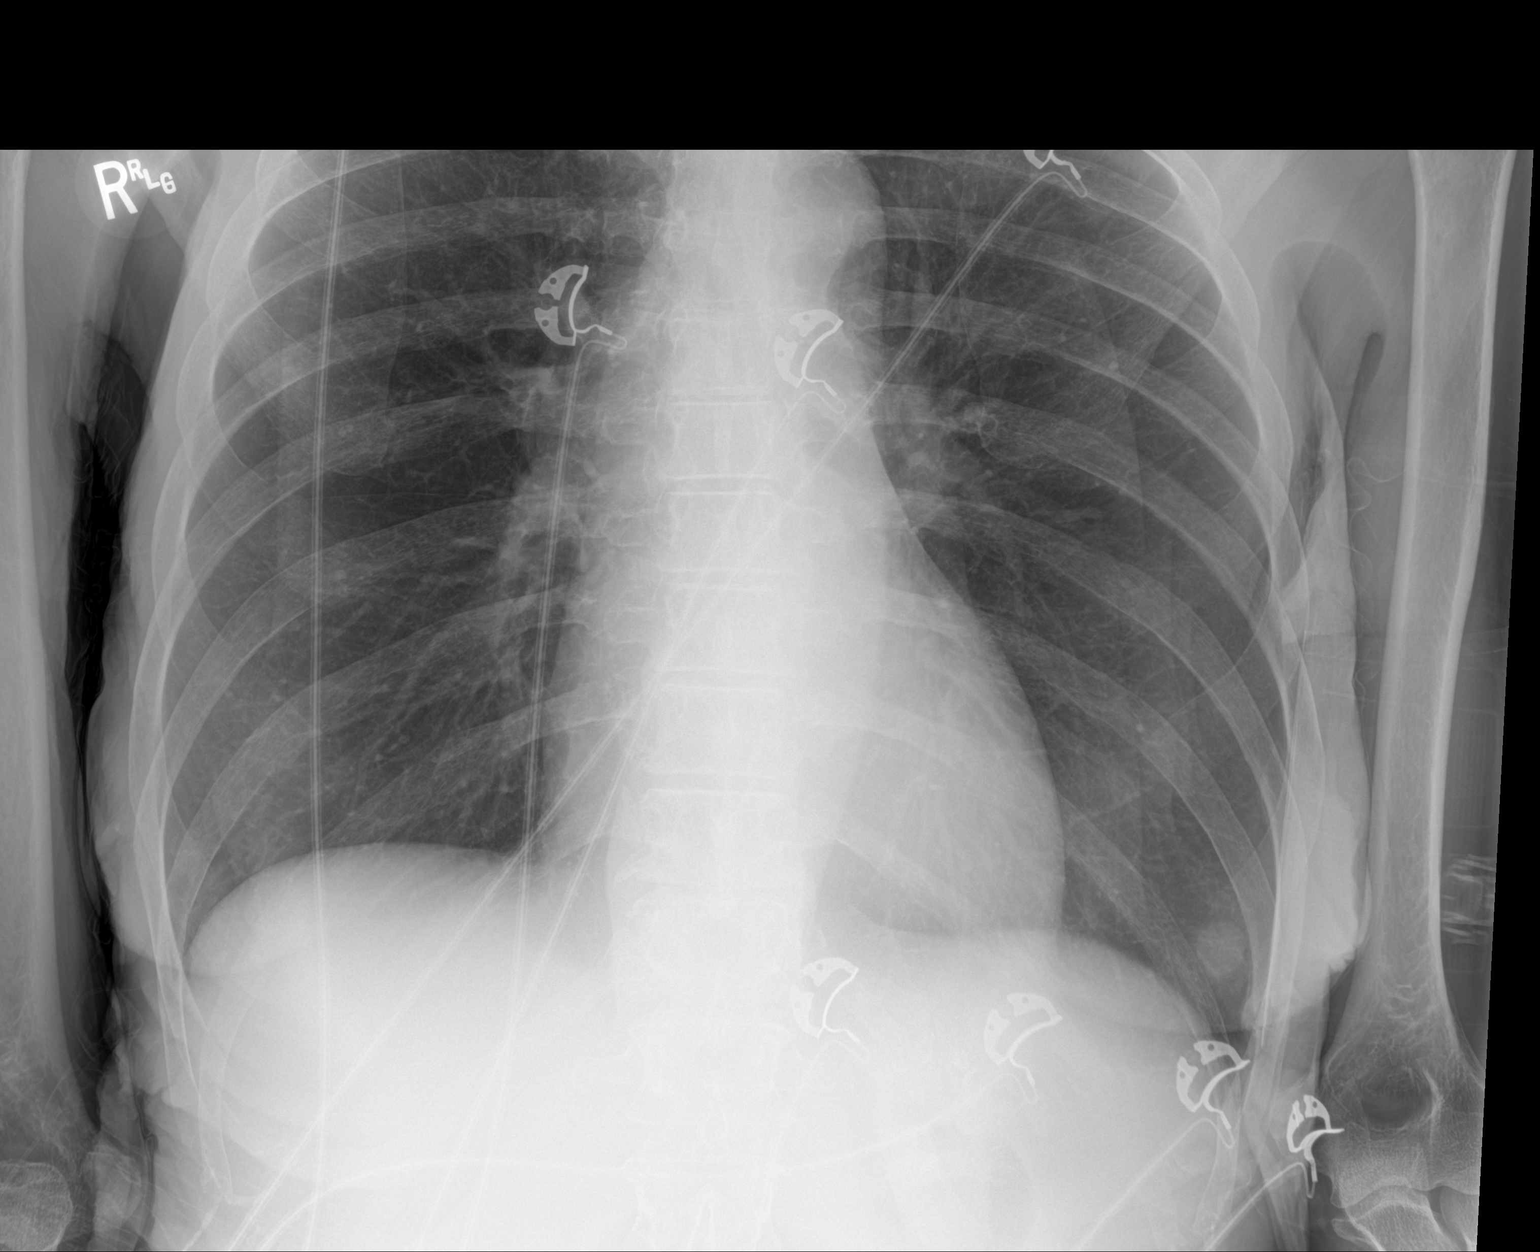

[2 of 2 positions shown; findings below may reference images not displayed]

FINDINGS: Skin fold artifacts are noted bilaterally. Lung volumes are normal.
No consolidative airspace disease. No pleural effusions. No
pneumothorax. No pulmonary nodule or mass noted. Pulmonary
vasculature and the cardiomediastinal silhouette are within normal
limits.
IMPRESSION: 1.  No radiographic evidence of acute cardiopulmonary disease.

## 2021-10-09 IMAGING — CT CT CHEST W/ CM
2 of 9 series · 14 of 36 positions shown, 17 images · IV contrast (omnipaque)
Comparison: CT Abdomen and Pelvis 01/17/2021 and earlier. Chest
radiograph 3222 hours today. Neck CT 09/25/2017.

CLINICAL DATA: 45-year-old female with increasing upper abdominal
pain.

EXAM:
CT CHEST, ABDOMEN, AND PELVIS WITH CONTRAST
TECHNIQUE: Multidetector CT imaging of the chest, abdomen and pelvis was
performed following the standard protocol during bolus
administration of intravenous contrast.
CONTRAST:  80mL OMNIPAQUE IOHEXOL 350 MG/ML SOLN

[Series 2: cap with · axial · 0.71mm/px · z∈[+1136,+1632]mm · 9 of 125 slices shown, 12 images]
[im 13/125  mediastinal]
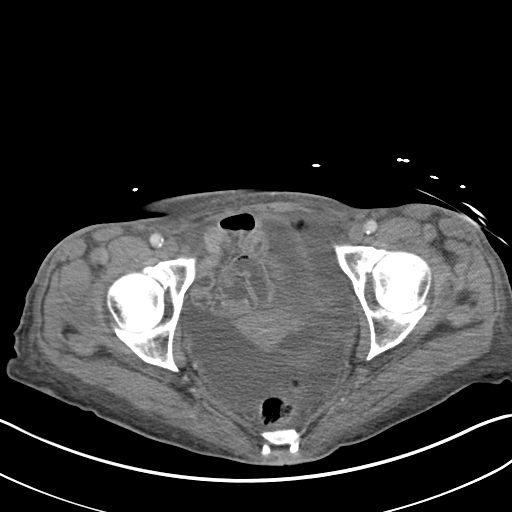
[im 13/125  lung]
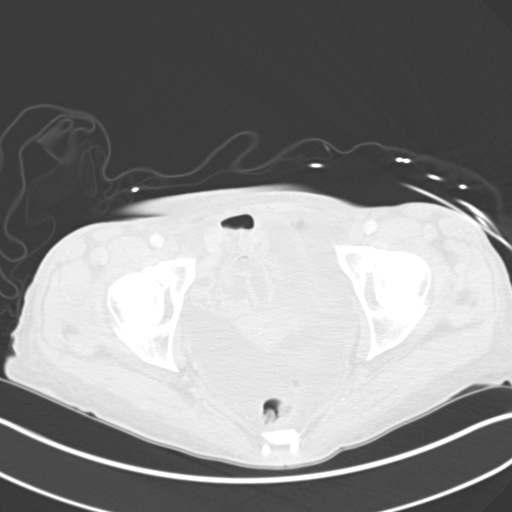
[im 25/125  lung]
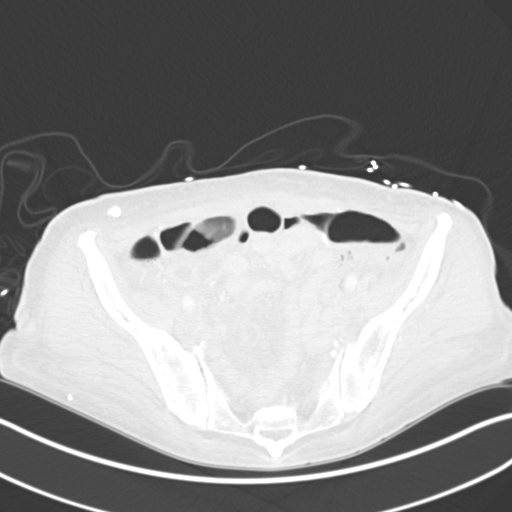
[im 38/125  lung]
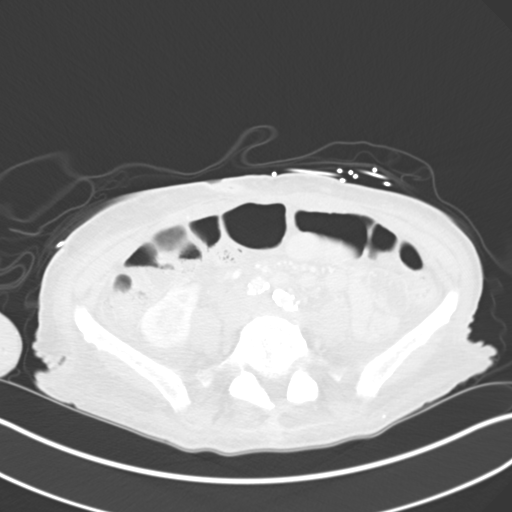
[im 50/125  lung]
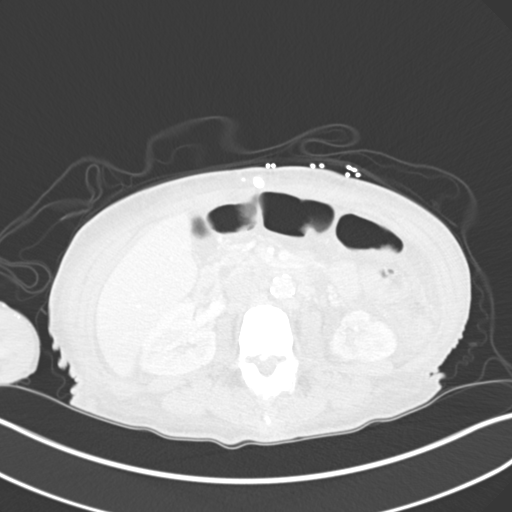
[im 63/125  mediastinal]
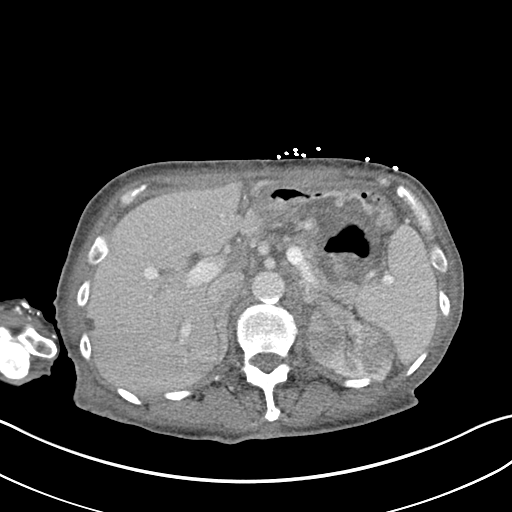
[im 63/125  lung]
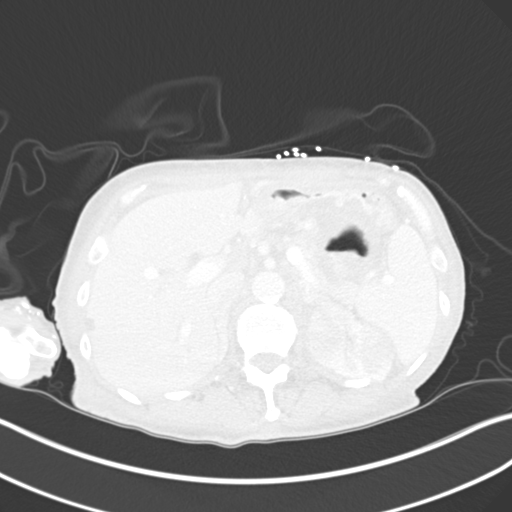
[im 75/125  lung]
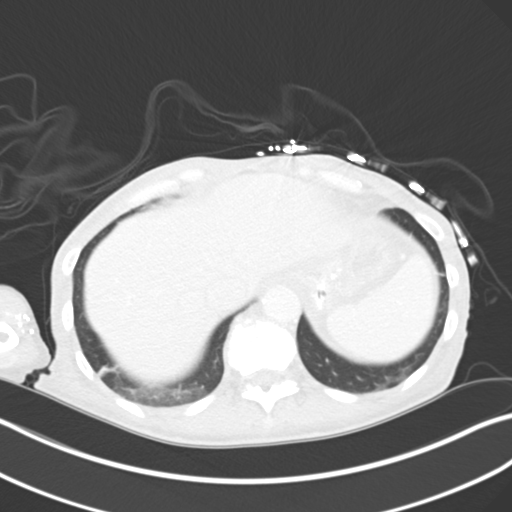
[im 87/125  lung]
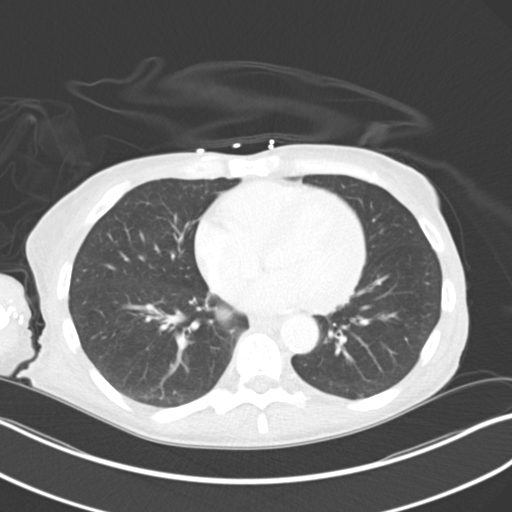
[im 100/125  lung]
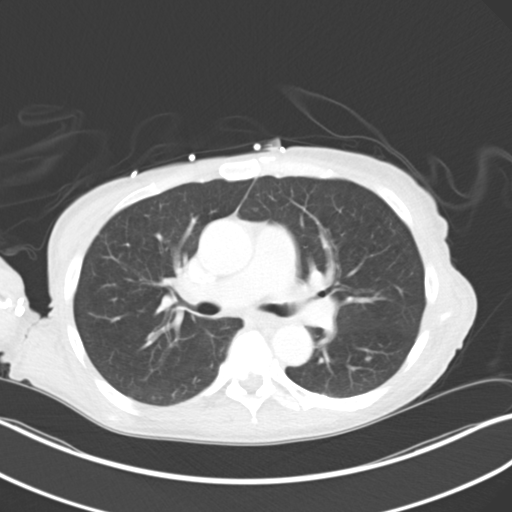
[im 112/125  mediastinal]
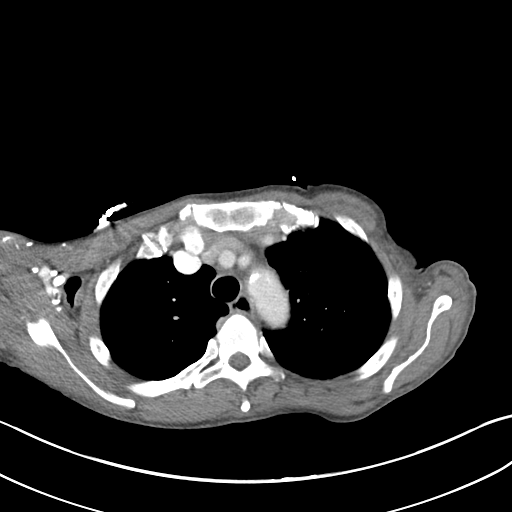
[im 112/125  lung]
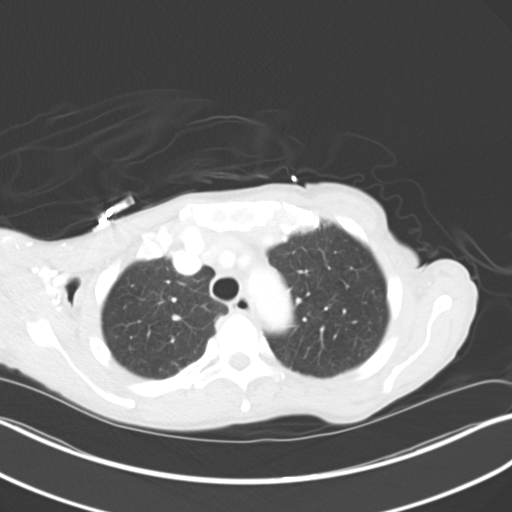

[Series 5: lung · axial · 0.71mm/px · z∈[+1432,+1576]mm · 5 of 145 slices shown]
[im 13/145  lung]
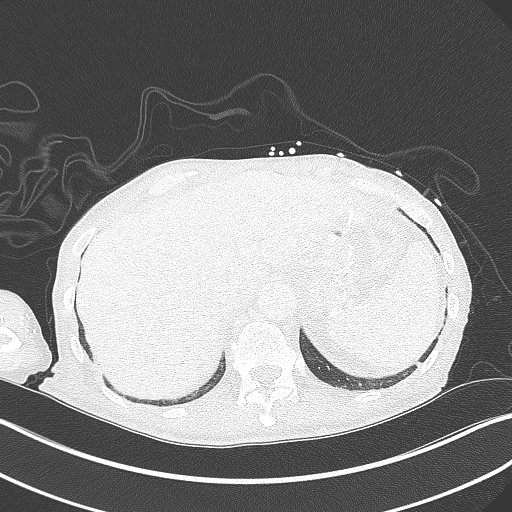
[im 37/145  lung]
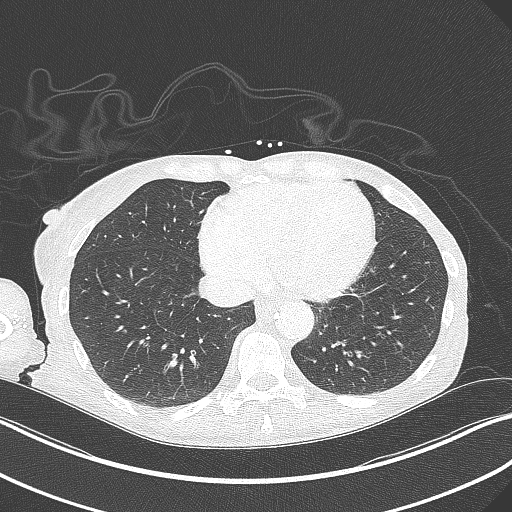
[im 49/145  lung]
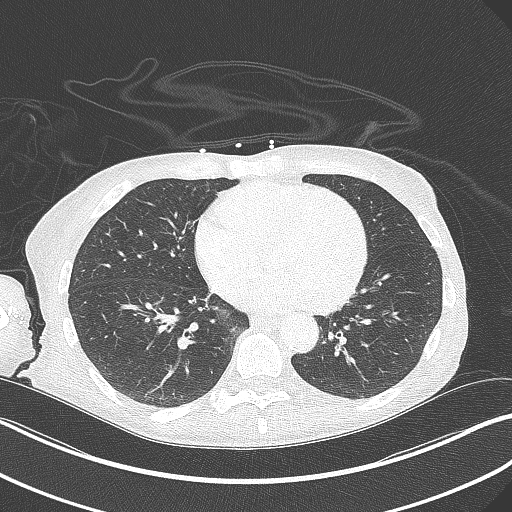
[im 61/145  lung]
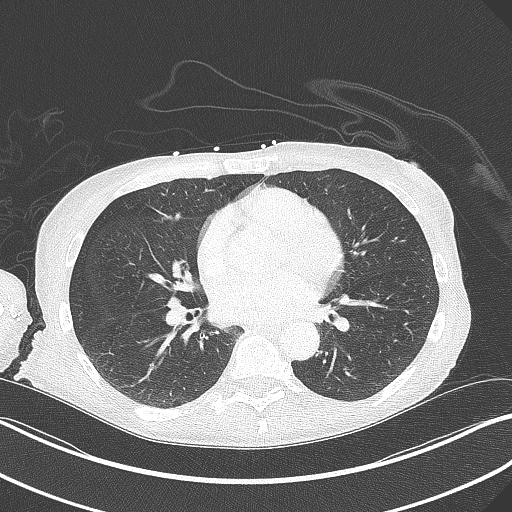
[im 85/145  lung]
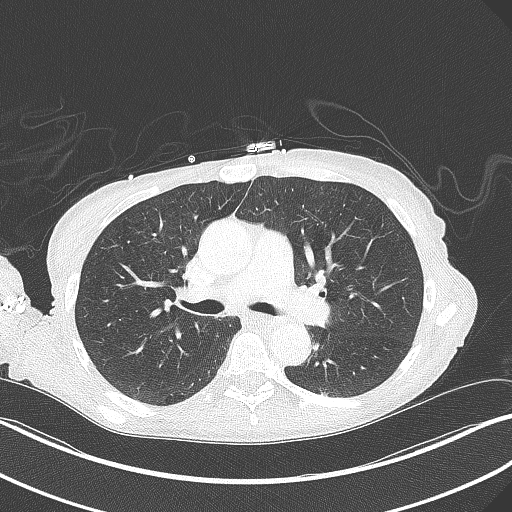

[14 of 36 positions shown; findings below may reference images not displayed]

FINDINGS: CT CHEST FINDINGS

Cardiovascular: Calcified aortic atherosclerosis. No cardiomegaly or
pericardial effusion. Calcified coronary artery atherosclerosis is
widespread (series 2, image 30). The central pulmonary arteries are
enhanced and appear to be patent.

Mediastinum/Nodes: Negative. No mediastinal mass or lymphadenopathy.

Lungs/Pleura: Major airways are patent with mild bilateral bronchial
wall thickening. Occasional subpleural lung scarring (anterior right
upper lobe series 5, image 42). Patchy dependent opacity in the
costophrenic angles more resembles atelectasis than infection. There
is questionable early centrilobular emphysema in the upper lobes. No
pleural effusion or other abnormal pulmonary opacity.

Musculoskeletal: No acute or suspicious osseous lesion in the chest.

CT ABDOMEN PELVIS FINDINGS

Hepatobiliary: Chronically absent gallbladder. Stable liver with
mild postoperative bile duct enlargement.

Pancreas: Stable pancreatic atrophy.

Spleen: Stable mild splenomegaly.

Adrenals/Urinary Tract: New heterogeneous enhancement of the left
kidney (series 12, image 64) typical for acute pyelonephritis. No
left hydronephrosis.

Right renal enhancement appears stable and within normal limits.
Adrenal glands remain normal. On the delayed images there is fairly
symmetric contrast excretion from both kidneys. No discrete
pararenal fluid collection. There is mild urothelial thickening at
the left renal pelvis. Unremarkable bladder.

Stomach/Bowel: Generalized mesenteric edema throughout the abdomen
and pelvis. No dilated loops. Chronic postoperative changes from
what appears to be previous Roux-en-Y type gastric bypass. No
discrete bowel inflammation or free air identified. Evidence of a
normal gas containing appendix on coronal image 86.

Vascular/Lymphatic: Advanced Aortoiliac calcified atherosclerosis.
Major arterial structures remain patent. Portal venous system is
inadequately opacified but the main portal vein does appear to be
patent. No lymphadenopathy identified.

Reproductive: Negative.  Numerous pelvic phleboliths.

Other: New pelvic free fluid, moderate volume with simple fluid
density (series 2, image 114). Generalized body wall edema has not
significantly changed since [REDACTED].

Musculoskeletal: Age advanced lumbar spine degeneration with
previous posterior lumbar decompression. Advanced chronic disc and
endplate degeneration from L3 to the sacrum. No acute osseous
abnormality identified.
IMPRESSION: 1. Acute Left Pyelonephritis. No associated renal abscess or other
complicating features.

2. New moderate volume pelvic free fluid since [REDACTED]. This is
nonspecific, but perhaps related to anasarca given the appearance of
continued body wall edema and new generalized mesenteric congestion
since [DATE]. No discrete bowel inflammation is evident. Previous Roux-en-Y
type gastric bypass with no bowel obstruction.

4. No acute or inflammatory process identified in the chest. No
pleural effusion.

5. Advanced calcified coronary artery and Aortic Atherosclerosis
([id number]).

## 2021-10-09 MED ORDER — ACETAMINOPHEN 325 MG PO TABS
650.0000 mg | ORAL_TABLET | Freq: Four times a day (QID) | ORAL | Status: DC | PRN
  Administered 2021-10-09: 650 mg via ORAL
  Filled 2021-10-09: qty 2

## 2021-10-09 MED ORDER — CYCLOBENZAPRINE HCL 5 MG PO TABS
5.0000 mg | ORAL_TABLET | Freq: Three times a day (TID) | ORAL | Status: DC | PRN
  Administered 2021-10-09 – 2021-10-11 (×3): 5 mg via ORAL
  Filled 2021-10-09 (×3): qty 1

## 2021-10-09 MED ORDER — HYDROCODONE-ACETAMINOPHEN 7.5-325 MG PO TABS
1.0000 | ORAL_TABLET | Freq: Four times a day (QID) | ORAL | Status: DC | PRN
  Administered 2021-10-09 – 2021-10-12 (×10): 1 via ORAL
  Filled 2021-10-09 (×10): qty 1

## 2021-10-09 NOTE — Progress Notes (Signed)
Pt report given to [facility] on 6E.  Pt transported via bed to room 1618 with all belongings.  Pt denies any complaints during transport.  Tele sitter transferred with patient.

## 2021-10-09 NOTE — Progress Notes (Signed)
Speech Language Pathology Treatment: Dysphagia  Patient Details Name: Terri [name] MRN: [id number] DOB: [birth year] Today's Date: 10/09/2021 Time: 9784-7841 SLP Time Calculation (min) (ACUTE ONLY): 10 min  Assessment / Plan / Recommendation Clinical Impression  Pt seen with am meal, she continues to tolerate regular textures if given assist for feeding as she feels her right hand is too weak to use. She needs extra time to [facility] given missing dentition and also sometimes spits out small pieces of hard foods, such an unripe melon. She is independent with texture management and does not require modification, only assistance with self feeding. SLP will sign off.   HPI HPI: Gastroenterology was consulted, EGD on 09/03/2021 showed esophageal plaques consistent with candidiasis, large cratered nonbleeding duodenal ulcer with no stigmata of bleeding.  bx 4/17 showed acute inflammation with necrosis compatible with abscess, granulomatous inflammation with eosinophilia and fragment of foreign material, negative for malignancy;  CXR Grossly stable right upper lobe pneumonia. Stable mild left basilar atelectasis or infiltrate., incr ox needs thus TEE deferred, 4/23  thora and s/p paracentesis 4/17; ? Endocarditis; cocaine +. Bedside swallow evaluation completed by SLP on 09/15/21 and patient's swallow at that time appeared largely [facility] and no f/u recommended. Patient had to be intubated on 4/30 secondary to tachypneic with accessory muscle use and increased WOB. She was extubated on 09/25/21 and has been NPO awaiting SLP swallow evaluation. MBS completed on 09/29/21 showing moderate oral phase dysphagia but pharyngeal and cervical phase swallow both WFL and patient started on Dys 3 (mechanical soft) solids, thin liquids.      SLP Plan  All goals met      Recommendations for follow up therapy are one component of a multi-disciplinary discharge planning process, led by the attending physician.  Recommendations may  be updated based on patient status, additional functional criteria and insurance authorization.    Recommendations  Diet recommendations: Regular;Thin liquid Liquids provided via: Cup;Straw Medication Administration: Whole meds with puree Supervision: Staff to assist with self feeding Postural Changes and/or Swallow Maneuvers: Seated upright 90 degrees;Upright 30-60 min after meal                Oral Care Recommendations: Oral care BID Follow Up Recommendations: Skilled nursing-short term rehab (<3 hours/day) Assistance recommended at discharge: Frequent or constant Supervision/Assistance SLP Visit Diagnosis: Dysphagia, pharyngoesophageal phase (R13.14) Plan: All goals met           Tilmon Wisehart, Katherene Ponto  10/09/2021, 10:01 AM

## 2021-10-09 NOTE — Progress Notes (Signed)
PROGRESS NOTE    Terri [name]  IOE:[id number] DOB: 01/29/1976 DOA: 08/31/2021 PCP: Pcp, No    Brief Narrative:  46 year old with history of recurrent GI bleeding, chronic microcytic anemia, hypertension, polysubstance abuse, Roux-en-Y surgery initially admitted to hospital with melanotic stool with history of chronic GI bleeding secondary to peptic ulcers.  Underwent endoscopy with large anastomotic ulceration noted, started on PPI and stabilized. While she was in the hospital found to have MSSA bacteremia with dissemination.  Her left great toe was gangrenous, underwent amputation.  Culture from the great toe grew MRSA. Patient was also found with multiple liver abscesses, has history of portal vein thrombosis. 4/28, transferred to ICU with increasing oxygen demand.  4/30, intubated-5/5 extubated and transferred to medical floor.  Esophageal candidiasis, finished Diflucan. MRSA pneumonia, grade 2 osteomyelitis status post amputation-treated with vancomycin and subsequently Teflaro Polymicrobial hepatic abscess, MSSA bacteremia-currently on Unasyn with plan until 5/31. Unable to do TEE.  Repeat echocardiogram with poor visualization of cardiac valves.  Treating as presumed endocarditis.  5/18, early morning hours patient had more shortness of breath, transiently started on BiPAP and transferred to stepdown unit.  Stabilized on BiPAP.  No new findings on chest x-ray or blood gas analysis. 5/19, currently on room air for last 24 hours.  Transfer to telemetry as he needs more care.  No need for cardiac monitoring.   Assessment & Plan:   Acute hypoxemic and hypercapnic respiratory failure secondary to MRSA pneumonia: Intubated- extubated.  Completed antibiotic therapy. 5/18, decompensated on BiPAP.  Already improving.  Continue aggressive chest physiotherapy and mobility. Deconditioning and frailty, unable to keep up secretions possibly decompensated her condition.  Polymicrobial liver  abscess, MSSA hepatic abscess with disseminated infection: Unasyn until 5/ 29. Cardiology recommended too risky to perform TEE, repeat TTE with no evidence of endocarditis, however poor visual. Planning to finish 6 weeks of IV antibiotics until 5/29 Bacteremia cleared. Source removed, left great toe. Left foot disarticulation done on 4/26, Ortho will review for suture removal.  Hypertensive urgency, essential hypertension: Blood pressures at goal now currently on amlodipine 10 mg, clonidine 0.1 mg/ 24 hours patch, hydralazine 50 mg twice daily.  Blood pressure stable now.  Acute metabolic infective encephalopathy: Resolved.  Mental status improved. Frail.  Acute GI bleeding with acute blood loss anemia: Presentation hemoglobin 4.6.  Received 6 units of PRBC.  Hemoglobin is stabilized.  Will monitor closely.  On PPI.  Severe protein calorie malnutrition, failure to thrive: Initially failed swallow evaluation, core track unable to be advanced by IR due to difficult gastric anatomy and very small gastric pouch 5/8, started on TPN due to very poor oral intake Plan, Marinol 5 mg twice daily Nutritional supplements Advance to regular diet and hope she can keep up.  Calorie count.  Pain management: Maintain on oral pain medications with hydrocodone, Tylenol and lidocaine patch.  Avoid IV pain medications.    DVT prophylaxis: heparin injection 5,000 Units Start: 09/30/21 2230 Place and maintain sequential compression device Start: 09/22/21 0443 Place and maintain sequential compression device Start: 09/11/21 2114 SCDs Start: 08/31/21 1939   Code Status: Full code.  Seen by palliative. Family Communication: Boyfriend at the bedside. Disposition Plan: Status is: Inpatient Remains inpatient appropriate because: Unsafe discharge disposition plan.      Consultants:  Multiple. Critical care, palliative care, gastroenterology  Procedures:  Multiple procedures as  above.  Antimicrobials:  Multiple medications.  Currently on Unasyn until 5/29.   Subjective:  Seen and examined.  Slept  well.  Not on oxygen since yesterday afternoon.  Weak cough.  She thinks she ate well.  Now at least she is taking extra doses of boost.  Objective: Vitals:   10/09/21 0400 10/09/21 0500 10/09/21 0700 10/09/21 0800  BP: 105/66 112/83 (!) 134/94 (!) 143/97  Pulse: 83 81 (!) 55 79  Resp: (!) '21 19 17 '$ (!) 22  Temp: 98.1 F (36.7 C)   98.4 F (36.9 C)  TempSrc: Oral   Oral  SpO2: 93% 100% 99% 95%  Weight:  49.5 kg    Height:        Intake/Output Summary (Last 24 hours) at 10/09/2021 1107 Last data filed at 10/09/2021 0800 Gross per 24 hour  Intake 513.5 ml  Output 1025 ml  Net -511.5 ml   Filed Weights   10/06/21 0508 10/08/21 0331 10/09/21 0500  Weight: 51.1 kg 47.8 kg 49.5 kg    Examination:  General: Thin and cachectic.  Chronically sick looking.  Frail and debilitated.  Ill-appearing.  On room air.  Sleepy today.   Cardiovascular: S1-S2 normal.  Regular rate rhythm. Respiratory: Bilateral clear.  On room air.  Conducted airway sounds. Gastrointestinal: Soft.  Nontender.  Bowel sound present. Ext: Cachectic extremities.  No edema no swelling. Neuro: Alert and awake.  Oriented x3-4.  Generalized weakness.  No acute deficits. Musculoskeletal: No deformities.  Left great toe with sutures intact. Skin:   Pressure Injury 09/24/21 Sacrum Medial Deep Tissue Pressure Injury - Purple or maroon localized area of discolored intact skin or blood-filled blister due to damage of underlying soft tissue from pressure and/or shear. several deep tissue injuries, dark  (Active)  09/24/21 1100  Location: Sacrum  Location Orientation: Medial  Staging: Deep Tissue Pressure Injury - Purple or maroon localized area of discolored intact skin or blood-filled blister due to damage of underlying soft tissue from pressure and/or shear.  Wound Description (Comments): several  deep tissue injuries, dark purple, non-blanchable  Present on Admission: No     Pressure Injury 09/24/21 Buttocks Left;Posterior;Proximal Deep Tissue Pressure Injury - Purple or maroon localized area of discolored intact skin or blood-filled blister due to damage of underlying soft tissue from pressure and/or shear. several purple,  (Active)  09/24/21 1100  Location: Buttocks (gluteal fold)  Location Orientation: Left;Posterior;Proximal  Staging: Deep Tissue Pressure Injury - Purple or maroon localized area of discolored intact skin or blood-filled blister due to damage of underlying soft tissue from pressure and/or shear.  Wound Description (Comments): several purple, non-blanchable  Present on Admission: No     Pressure Injury 09/24/21 Vertebral column Lower Deep Tissue Pressure Injury - Purple or maroon localized area of discolored intact skin or blood-filled blister due to damage of underlying soft tissue from pressure and/or shear. spinal column deep tissue, (Active)  09/24/21 1100  Location: Vertebral column  Location Orientation: Lower  Staging: Deep Tissue Pressure Injury - Purple or maroon localized area of discolored intact skin or blood-filled blister due to damage of underlying soft tissue from pressure and/or shear.  Wound Description (Comments): spinal column deep tissue, purple, non-blanchable  Present on Admission: No     Pressure Injury 09/24/21 Vertebral column Right;Upper Deep Tissue Pressure Injury - Purple or maroon localized area of discolored intact skin or blood-filled blister due to damage of underlying soft tissue from pressure and/or shear. purple, non-blanchab (Active)  09/24/21 1100  Location: Vertebral column  Location Orientation: Right;Upper  Staging: Deep Tissue Pressure Injury - Purple or maroon localized area of discolored  intact skin or blood-filled blister due to damage of underlying soft tissue from pressure and/or shear.  Wound Description (Comments):  purple, non-blanchable  Present on Admission: No     Pressure Injury 10/07/21 Buttocks Right Stage 2 -  Partial thickness loss of dermis presenting as a shallow open injury with a red, pink wound bed without slough. (Active)  10/07/21 2300  Location: Buttocks  Location Orientation: Right  Staging: Stage 2 -  Partial thickness loss of dermis presenting as a shallow open injury with a red, pink wound bed without slough.  Wound Description (Comments):   Present on Admission: Yes        Data Reviewed: I have personally reviewed following labs and imaging studies  CBC: Recent Labs  Lab 10/05/21 0337 10/06/21 0434 10/07/21 0321 10/08/21 0244 10/09/21 0510  WBC 2.9* 3.7* 3.9* 6.9 4.6  NEUTROABS 1.7 2.3 2.3 5.4 2.8  HGB 7.0* 8.1* 7.8* 8.1* 8.1*  HCT 21.0* 24.5* 23.6* 24.9* 25.1*  MCV 81.1 79.0* 79.7* 81.1 81.8  PLT 107* 128* 133* 153 884*   Basic Metabolic Panel: Recent Labs  Lab 10/03/21 0500 10/04/21 0414 10/05/21 0337 10/06/21 0434 10/07/21 0321 10/08/21 0244 10/09/21 0510  NA 131* 135 132* 135 139 140 136  K 3.7 4.2 4.3 4.4 4.4 4.4 4.0  CL 103 106 105 106 110 111 110  CO2 '23 23 22 24 24 24 22  '$ GLUCOSE 85 80 76 88 87 81 78  BUN 22* 23* 20 25* 30* 28* 27*  CREATININE <0.30* <0.30* 0.34* 0.41* 0.38* 0.47 0.48  CALCIUM 8.0* 8.3* 7.9* 7.9* 8.2* 8.1* 7.9*  MG 1.9 1.9 1.7 2.0 2.0  --   --   PHOS 3.1 3.4 4.1 4.7* 4.2  --   --    GFR: Estimated Creatinine Clearance: 67.9 mL/min (by C-G formula based on SCr of 0.48 mg/dL). Liver Function Tests: Recent Labs  Lab 10/04/21 0414 10/05/21 0337 10/07/21 0321 10/08/21 0244 10/09/21 0510  AST '27 26 28 31 27  '$ ALT '23 23 28 '$ 32 27  ALKPHOS 122 115 127* 137* 125  BILITOT 0.4 0.6 0.6 0.4 0.6  PROT 5.5* 5.4* 5.8* 6.1* 5.9*  ALBUMIN 2.1* 2.0* 2.1* 2.2* 2.1*   No results for input(s): LIPASE, AMYLASE in the last 168 hours. No results for input(s): AMMONIA in the last 168 hours. Coagulation Profile: No results for input(s):  INR, PROTIME in the last 168 hours. Cardiac Enzymes: No results for input(s): CKTOTAL, CKMB, CKMBINDEX, TROPONINI in the last 168 hours. BNP (last 3 results) No results for input(s): PROBNP in the last 8760 hours. HbA1C: No results for input(s): HGBA1C in the last 72 hours. CBG: Recent Labs  Lab 10/03/21 0727 10/03/21 1211 10/03/21 1546 10/05/21 0301 10/07/21 1645  GLUCAP 122* 114* 103* 82 86   Lipid Profile: No results for input(s): CHOL, HDL, LDLCALC, TRIG, CHOLHDL, LDLDIRECT in the last 72 hours.  Thyroid Function Tests: No results for input(s): TSH, T4TOTAL, FREET4, T3FREE, THYROIDAB in the last 72 hours. Anemia Panel: No results for input(s): VITAMINB12, FOLATE, FERRITIN, TIBC, IRON, RETICCTPCT in the last 72 hours. Sepsis Labs: No results for input(s): PROCALCITON, LATICACIDVEN in the last 168 hours.  No results found for this or any previous visit (from the past 240 hour(s)).       Radiology Studies: DG CHEST PORT 1 VIEW  Result Date: 10/07/2021 CLINICAL DATA:  Respiratory compromise EXAM: PORTABLE CHEST 1 VIEW COMPARISON:  10/05/2021 FINDINGS: Cardiac shadow is stable. Right-sided PICC is again seen  and stable. The lungs are hyperinflated. Patchy airspace opacity is noted in the bases bilaterally consistent with atelectasis. No bony abnormality is noted. IMPRESSION: Slight increase in basilar atelectasis. Electronically Signed   By: Inez Catalina M.D.   On: 10/07/2021 22:47        Scheduled Meds:  amLODipine  10 mg Oral Daily   calcium carbonate  1 tablet Oral TID   chlorhexidine  15 mL Mouth Rinse BID   Chlorhexidine Gluconate Cloth  6 each Topical Daily   cholecalciferol  1,000 Units Oral Daily   cloNIDine  0.1 mg Transdermal Weekly   dronabinol  5 mg Oral BID AC   feeding supplement  237 mL Oral TID BM   folic acid  1 mg Oral Daily   guaiFENesin  5 mL Oral Q6H   heparin injection (subcutaneous)  5,000 Units Subcutaneous Q8H   hydrALAZINE  50 mg Oral BID    lidocaine  1 patch Transdermal Daily   lisinopril  10 mg Oral QHS   mouth rinse  15 mL Mouth Rinse q12n4p   multivitamin with minerals  1 tablet Oral BID WC   nicotine  21 mg Transdermal Daily   nutrition supplement (JUVEN)  1 packet Oral BID BM   pantoprazole  40 mg Oral BID   sodium chloride flush  10-40 mL Intracatheter Q12H   sucralfate  1 g Oral TID WC & HS   thiamine  100 mg Oral Daily   Continuous Infusions:  sodium chloride 10 mL/hr at 09/30/21 0800   sodium chloride Stopped (09/30/21 1352)   ampicillin-sulbactam (UNASYN) IV Stopped (10/09/21 0837)     LOS: 38 days    Time spent: 40 minutes    Barb Merino, MD [facility] Pager [phone]

## 2021-10-09 NOTE — Progress Notes (Signed)
Subjective: 23 Days Post-Op Procedure(s) (LRB): AMPUTATION OF LEFT GREAT TOE (Left)  Patient reports no pain in her left foot.  She is currently in the ICU at time of evaluation.   Objective:   VITALS:  Temp:  [97.9 F (36.6 C)-98.5 F (36.9 C)] 98.5 F (36.9 C) (05/19 2055) Pulse Rate:  [55-88] 80 (05/19 2055) Resp:  [17-25] 18 (05/19 2055) BP: (105-145)/(66-98) 128/96 (05/19 2055) SpO2:  [93 %-100 %] 95 % (05/19 2055) Weight:  [49.5 kg] 49.5 kg (05/19 0500)  Gen: AAOx3, NAD Comfortable at rest  Left Lower Extremity: Skin intact, incision c/d/I over great toe stump, sutures in place No TTP over incision, no erythema/induration, no active drainage HF/KE/KF/ADF/APF 5/5 SILT throughout DP, PT 2+ to palp CR < 2s   LABS Recent Labs    10/07/21 0321 10/08/21 0244 10/09/21 0510  HGB 7.8* 8.1* 8.1*  WBC 3.9* 6.9 4.6  PLT 133* 153 144*   Recent Labs    10/08/21 0244 10/09/21 0510  NA 140 136  K 4.4 4.0  CL 111 110  CO2 24 22  BUN 28* 27*  CREATININE 0.47 0.48  GLUCOSE 81 78   No results for input(s): LABPT, INR in the last 72 hours.   Assessment/Plan: 23 Days Post-Op Procedure(s) (LRB): AMPUTATION OF LEFT GREAT TOE (Left)  -sutures removed today at bedside without difficulty, steristrips applied with light dry dressing -IV abx per primary team; trend intraoperative cultures -maximum elevation of operative extremity to minimize swelling -OK to ambulate with heel weightbearing in CAM boot or postop shoe -pain meds per primary team -VTE prophylaxis per primary team discretion -follow up with Ortho after discharge from hospital for wound check ([phone] for appt)  Armond Hang 10/09/2021, 9:42 PM

## 2021-10-09 NOTE — Progress Notes (Signed)
Received report from [place], [place] in ICU.

## 2021-10-09 NOTE — TOC Progression Note (Addendum)
Transition of Care [facility]) - Progression Note   Patient Details  Name: Terri [name] MRN: [id number] Date of Birth: 03-25-1976  Transition of [facility]) CM/SW [place], LCSW Phone Number: 10/09/2021, 11:35 AM  Clinical Narrative: PASRR received: [id number] A. Patient currently does not have bed offers at this time.  Addendum: [facility] has made a bed offer.  Expected Discharge Plan: Home/Self Care Barriers to Discharge: Continued Medical Work up  Expected Discharge Plan and Services Expected Discharge Plan: Home/Self Care Discharge Planning Services: CM Consult Living arrangements for the past 2 months: No permanent address  Readmission Risk Interventions    09/02/2021    2:25 PM  Readmission Risk Prevention Plan  Transportation Screening Complete  Medication Review ([place]) Complete  PCP or Specialist appointment within 3-5 days of discharge Complete  Palliative Care Screening Not [place] Not Applicable

## 2021-10-10 NOTE — Progress Notes (Signed)
PROGRESS NOTE    Terri [name]  ZGY:[id number] DOB: 1975/06/23 DOA: 08/31/2021 PCP: Pcp, No   Brief Narrative:  46 year old with recurrent GI bleed, chronic microcytic anemia, HTN, polysubstance abuse, Roux-en-Y surgery admitted to the hospital initially with melanotic stool likely from chronic peptic ulcer.  Underwent endoscopy showing large anastomotic ulcer started on PPI.  While in the hospital she was found to have MSSA bacteremia from left great toe gangrene and underwent amputation.  Eventually cultures grew MRSA and later was found to have multiple liver abscesses.  She was transferred to the ICU for acute respiratory distress requiring intubation.  She also had esophageal Candida and completed a course of Diflucan.  Currently patient is on IV Unasyn until 5/29.  Echocardiogram had poor visualization of the valves and unable to perform TEE therefore with presumed endocarditis she is being treated with IV antibiotics.   Assessment & Plan:  Principal Problem:   Disseminated MSSA with liver abscess, left foot osteo and abscess, 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 Active Problems:   Multilobar pneumonia with acute hypoxic respiratory failure (HCC)   Hepatic abscess   Acute respiratory failure with hypoxia (HCC)   Acute on chronic blood loss anemia   Swelling of left hand   Portal vein thrombosis   Exudative pleural effusion   Involuntary commitment   Ascites   Vaginal discharge   Polysubstance abuse (HCC)   Hypophosphatemia   Candida infection, esophageal (HCC)   Opioid abuse with opioid-induced mood disorder (HCC)   Leucocytosis   Protein-calorie malnutrition, severe   Goals of care, counseling/discussion   Drug-seeking behavior   Pressure injury of skin     Assessment and Plan: Acute hypoxic and hypercapnic respiratory failure secondary to MRSA pneumonia - Completed antibiotic treatment.  Due to decompensation she was placed on BiPAP.  Slowly wean off as appropriate.   Bronchodilators.  I-S/flutter valve.  * Disseminated MSSA with liver abscess, left foot osteo and abscess, 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 -Think initially this started with left toe osteo requiring amputation and I&D on 4/27.  Echocardiogram showed poor visualization of the valves but TEE was canceled due to respiratory decompensation.  Currently being treated with IV Unasyn with presumed endocarditis for total of 6 weeks, last day 5/29.  Surveillance culture appears to have cleared bacteremia for now. -Will likely require repeat CT or MRI liver in 4-6 weeks time to reassess abscess -Seen by speech therapist-recommending regular thin liquid.  Patient has met all the goals.  Acute upper GI bleed, resolved.  Hemoglobin is stable --resolved. EGD 4/13 with esophageal plaques c/w candidiasis, large cratered non bleeding jejunal ulcer with non stigmata of bleeding.  Nodular regions more like regenerative tissue and not like visible vessels. PPI BID x2 months, sucralfate QID x2 weeks  Hepatic abscess --abx as above  Multilobar pneumonia with acute hypoxic respiratory failure ([place]) -As mentioned above  Severe protein calorie malnutrition - Encourage oral intake.  Seen by dietitian.  Osteomyelitis of great toe of left foot (HCC)-resolved as of 09/19/2021 --OK to ambulate with heel weightbearing in CAM boot or postop shoe --bedside nursing to do daily dry dressing changes- -VTE prophylaxis (Ortho recs Aspirin 81 mg BID x 6 wk or per primary team preference) --follow up with Ortho within 1 week from discharge from hospital for wound check [phone] for appt) --sutures out in 3-4 weeks in outpatient office  Gangrene of toe of left foot (HCC)-resolved as of 09/19/2021 --Seen by Dr. Scot Dock on 4/15, noted dry gangrene on tip of L  great toe, palpable pedal pulses, thought to have adequate circulation to heal this (unclear etiology per vascular).   --now s/p amputation by orthopedics  Acute on chronic  blood loss anemia --secondary to GIB; stable  Swelling of left hand --MRI limited, no fluid collection or tenosynovitis seen  Involuntary commitment --as per psychiatry  Exudative pleural effusion --s/p thoracentesis 4/23 with 1.2 L of turbid light yellow fluid; post thora CXR with severe multilobar bilateral pneumonia, most pronounced in R mid lung, needs f/u PA/lateral in 3-4 weeks following abx therapy to ensure resolution  Portal vein thrombosis --Large burden of thrombus in central portal vein as well as R portal vein and branches --as per GI, not a candidate for anticoagulation   Vaginal discharge --currently on metronidazole for presumed BV  Ascites --2 L removed during liver bx, but analysis not performed. Repeat US didn't show enough fluid for repeat paracentesis  Hypophosphatemia --replete  Polysubstance abuse ([place]) --Encourage cessation. UDS with cocaine   Hypokalemia --replete as needed  Essential hypertension -Continue current medications  Candida infection, esophageal ([place]) -Treated with Diflucan  Suicidal ideation-resolved as of 09/17/2021 --resolved, discontinue sitter per psychiatry    DVT prophylaxis: Subcu heparin Code Status: Full code Family Communication:    Status is: Inpatient Remains inpatient appropriate because: [facility] team is working on placement at this time.   Nutritional status    Signs/Symptoms: severe fat depletion, severe muscle depletion  Interventions: Ensure Enlive (each supplement provides 350kcal and 20 grams of protein), MVI, Juven, Refer to RD note for recommendations  Body mass index is 16.51 kg/m.  Pressure Injury 09/24/21 Sacrum Medial Deep Tissue Pressure Injury - Purple or maroon localized area of discolored intact skin or blood-filled blister due to damage of underlying soft tissue from pressure and/or shear. several deep tissue injuries, dark  (Active)  09/24/21 1100  Location: Sacrum  Location Orientation:  Medial  Staging: Deep Tissue Pressure Injury - Purple or maroon localized area of discolored intact skin or blood-filled blister due to damage of underlying soft tissue from pressure and/or shear.  Wound Description (Comments): several deep tissue injuries, dark purple, non-blanchable  Present on Admission: No     Pressure Injury 09/24/21 Buttocks Left;Posterior;Proximal Deep Tissue Pressure Injury - Purple or maroon localized area of discolored intact skin or blood-filled blister due to damage of underlying soft tissue from pressure and/or shear. several purple,  (Active)  09/24/21 1100  Location: Buttocks (gluteal fold)  Location Orientation: Left;Posterior;Proximal  Staging: Deep Tissue Pressure Injury - Purple or maroon localized area of discolored intact skin or blood-filled blister due to damage of underlying soft tissue from pressure and/or shear.  Wound Description (Comments): several purple, non-blanchable  Present on Admission: No     Pressure Injury 09/24/21 Vertebral column Lower Deep Tissue Pressure Injury - Purple or maroon localized area of discolored intact skin or blood-filled blister due to damage of underlying soft tissue from pressure and/or shear. spinal column deep tissue, (Active)  09/24/21 1100  Location: Vertebral column  Location Orientation: Lower  Staging: Deep Tissue Pressure Injury - Purple or maroon localized area of discolored intact skin or blood-filled blister due to damage of underlying soft tissue from pressure and/or shear.  Wound Description (Comments): spinal column deep tissue, purple, non-blanchable  Present on Admission: No     Pressure Injury 09/24/21 Vertebral column Right;Upper Deep Tissue Pressure Injury - Purple or maroon localized area of discolored intact skin or blood-filled blister due to damage of underlying soft tissue from pressure  and/or shear. purple, non-blanchab (Active)  09/24/21 1100  Location: Vertebral column  Location Orientation:  Right;Upper  Staging: Deep Tissue Pressure Injury - Purple or maroon localized area of discolored intact skin or blood-filled blister due to damage of underlying soft tissue from pressure and/or shear.  Wound Description (Comments): purple, non-blanchable  Present on Admission: No     Pressure Injury 10/07/21 Buttocks Right Stage 2 -  Partial thickness loss of dermis presenting as a shallow open injury with a red, pink wound bed without slough. (Active)  10/07/21 2300  Location: Buttocks  Location Orientation: Right  Staging: Stage 2 -  Partial thickness loss of dermis presenting as a shallow open injury with a red, pink wound bed without slough.  Wound Description (Comments):   Present on Admission: Yes        Subjective: Patient still has very poor appetite.  No acute events overnight.  Remains afebrile.   Examination:  General exam: Not in acute distress.  Cachectic frail with bilateral temporal wasting.  Overall very weak and chronically ill-appearing. Respiratory system: Clear to auscultation. Respiratory effort normal. Cardiovascular system: S1 & S2 heard, RRR. No JVD, murmurs, rubs, gallops or clicks. No pedal edema. Gastrointestinal system: Abdomen is nondistended, soft and nontender. No organomegaly or masses felt. Normal bowel sounds heard. Central nervous system: Alert and oriented. No focal neurological deficits. Extremities: Symmetric 5 x 5 power. Skin: No rashes, lesions or ulcers Psychiatry: Judgement and insight appear normal. Mood & affect appropriate.     Objective: Vitals:   10/09/21 2055 10/10/21 0019 10/10/21 0450 10/10/21 0452  BP: (!) 128/96 (!) 139/102 (!) 148/99   Pulse: 80 75 71   Resp: 18  18   Temp: 98.5 F (36.9 C)     TempSrc: Oral     SpO2: 95% 92% 94%   Weight:    50.7 kg  Height:        Intake/Output Summary (Last 24 hours) at 10/10/2021 0817 Last data filed at 10/10/2021 0960 Gross per 24 hour  Intake 1002.17 ml  Output 525 ml  Net  477.17 ml   Filed Weights   10/08/21 0331 10/09/21 0500 10/10/21 0452  Weight: 47.8 kg 49.5 kg 50.7 kg     Data Reviewed:   CBC: Recent Labs  Lab 10/05/21 0337 10/06/21 0434 10/07/21 0321 10/08/21 0244 10/09/21 0510  WBC 2.9* 3.7* 3.9* 6.9 4.6  NEUTROABS 1.7 2.3 2.3 5.4 2.8  HGB 7.0* 8.1* 7.8* 8.1* 8.1*  HCT 21.0* 24.5* 23.6* 24.9* 25.1*  MCV 81.1 79.0* 79.7* 81.1 81.8  PLT 107* 128* 133* 153 454*   Basic Metabolic Panel: Recent Labs  Lab 10/04/21 0414 10/05/21 0337 10/06/21 0434 10/07/21 0321 10/08/21 0244 10/09/21 0510  NA 135 132* 135 139 140 136  K 4.2 4.3 4.4 4.4 4.4 4.0  CL 106 105 106 110 111 110  CO2 23 22 24 24 24 22   GLUCOSE 80 76 88 87 81 78  BUN 23* 20 25* 30* 28* 27*  CREATININE <0.30* 0.34* 0.41* 0.38* 0.47 0.48  CALCIUM 8.3* 7.9* 7.9* 8.2* 8.1* 7.9*  MG 1.9 1.7 2.0 2.0  --   --   PHOS 3.4 4.1 4.7* 4.2  --   --    GFR: Estimated Creatinine Clearance: 69.6 mL/min (by C-G formula based on SCr of 0.48 mg/dL). Liver Function Tests: Recent Labs  Lab 10/04/21 0414 10/05/21 0337 10/07/21 0321 10/08/21 0244 10/09/21 0510  AST 27 26 28 31 27   ALT 23 23  28 32 27  ALKPHOS 122 115 127* 137* 125  BILITOT 0.4 0.6 0.6 0.4 0.6  PROT 5.5* 5.4* 5.8* 6.1* 5.9*  ALBUMIN 2.1* 2.0* 2.1* 2.2* 2.1*   No results for input(s): LIPASE, AMYLASE in the last 168 hours. No results for input(s): AMMONIA in the last 168 hours. Coagulation Profile: No results for input(s): INR, PROTIME in the last 168 hours. Cardiac Enzymes: No results for input(s): CKTOTAL, CKMB, CKMBINDEX, TROPONINI in the last 168 hours. BNP (last 3 results) No results for input(s): PROBNP in the last 8760 hours. HbA1C: No results for input(s): HGBA1C in the last 72 hours. CBG: Recent Labs  Lab 10/03/21 1211 10/03/21 1546 10/05/21 0301 10/07/21 1645  GLUCAP 114* 103* 82 86   Lipid Profile: No results for input(s): CHOL, HDL, LDLCALC, TRIG, CHOLHDL, LDLDIRECT in the last 72  hours. Thyroid Function Tests: No results for input(s): TSH, T4TOTAL, FREET4, T3FREE, THYROIDAB in the last 72 hours. Anemia Panel: No results for input(s): VITAMINB12, FOLATE, FERRITIN, TIBC, IRON, RETICCTPCT in the last 72 hours. Sepsis Labs: No results for input(s): PROCALCITON, LATICACIDVEN in the last 168 hours.  No results found for this or any previous visit (from the past 240 hour(s)).       Radiology Studies: No results found.      Scheduled Meds:  amLODipine  10 mg Oral Daily   calcium carbonate  1 tablet Oral TID   chlorhexidine  15 mL Mouth Rinse BID   Chlorhexidine Gluconate Cloth  6 each Topical Daily   cholecalciferol  1,000 Units Oral Daily   cloNIDine  0.1 mg Transdermal Weekly   dronabinol  5 mg Oral BID AC   feeding supplement  237 mL Oral TID BM   folic acid  1 mg Oral Daily   guaiFENesin  5 mL Oral Q6H   heparin injection (subcutaneous)  5,000 Units Subcutaneous Q8H   hydrALAZINE  50 mg Oral BID   lidocaine  1 patch Transdermal Daily   lisinopril  10 mg Oral QHS   mouth rinse  15 mL Mouth Rinse q12n4p   multivitamin with minerals  1 tablet Oral BID WC   nicotine  21 mg Transdermal Daily   nutrition supplement (JUVEN)  1 packet Oral BID BM   pantoprazole  40 mg Oral BID   sodium chloride flush  10-40 mL Intracatheter Q12H   sucralfate  1 g Oral TID WC & HS   thiamine  100 mg Oral Daily   Continuous Infusions:  sodium chloride 10 mL/hr at 09/30/21 0800   sodium chloride 10 mL/hr at 10/10/21 0807   ampicillin-sulbactam (UNASYN) IV Stopped (10/10/21 0226)     LOS: 39 days   Time spent= 35 mins    Denyla Cortese Arsenio Loader, MD [facility]  If 7PM-7AM, please contact night-coverage  10/10/2021, 8:17 AM

## 2021-10-10 NOTE — TOC Progression Note (Signed)
Transition of Care [facility]) - Progression Note    Patient Details  Name: Terri [name] MRN: [id number] Date of Birth: [birth year]  Transition of Care [facility]) CM/SW Contact  Ross Ludwig, [place] Phone Number: 10/10/2021, 3:16 PM  Clinical Narrative:     CSW received a phone call from [place] at [facility] she said they offered on patient and can accept her on Monday if she is medically ready for discharge.  CSW to leave message for weekday [facility] case manager.    Expected Discharge Plan: Home/Self Care Barriers to Discharge: Continued Medical Work up  Expected Discharge Plan and Services Expected Discharge Plan: Home/Self Care   Discharge Planning Services: CM Consult   Living arrangements for the past 2 months: No permanent address                                       Social Determinants of Health (SDOH) Interventions    Readmission Risk Interventions    10/09/2021   11:36 AM 09/02/2021    2:25 PM  Readmission Risk Prevention Plan  Transportation Screening  Complete  Medication Review ([place])  Complete  PCP or Specialist appointment within 3-5 days of discharge  Complete  HRI or [place] Complete   SW Recovery Care/Counseling Consult Complete   Palliative Care Screening Not Applicable Not [place] Complete Not Applicable

## 2021-10-10 NOTE — Progress Notes (Signed)
Resident refused labs despite education from this RN and lab tech.

## 2021-10-11 LAB — CBC WITH DIFFERENTIAL/PLATELET
Abs Immature Granulocytes: 0.01 10*3/uL (ref 0.00–0.07)
Basophils Absolute: 0.1 10*3/uL (ref 0.0–0.1)
Basophils Relative: 1 %
Eosinophils Absolute: 0.1 10*3/uL (ref 0.0–0.5)
Eosinophils Relative: 2 %
HCT: 28.7 % — ABNORMAL LOW (ref 36.0–46.0)
Hemoglobin: 9.5 g/dL — ABNORMAL LOW (ref 12.0–15.0)
Immature Granulocytes: 0 %
Lymphocytes Relative: 29 %
Lymphs Abs: 1.3 10*3/uL (ref 0.7–4.0)
MCH: 26.9 pg (ref 26.0–34.0)
MCHC: 33.1 g/dL (ref 30.0–36.0)
MCV: 81.3 fL (ref 80.0–100.0)
Monocytes Absolute: 0.3 10*3/uL (ref 0.1–1.0)
Monocytes Relative: 6 %
Neutro Abs: 2.9 10*3/uL (ref 1.7–7.7)
Neutrophils Relative %: 62 %
Platelets: 168 10*3/uL (ref 150–400)
RBC: 3.53 MIL/uL — ABNORMAL LOW (ref 3.87–5.11)
RDW: 23.2 % — ABNORMAL HIGH (ref 11.5–15.5)
WBC: 4.6 10*3/uL (ref 4.0–10.5)
nRBC: 0 % (ref 0.0–0.2)

## 2021-10-11 LAB — COMPREHENSIVE METABOLIC PANEL
ALT: 25 U/L (ref 0–44)
AST: 22 U/L (ref 15–41)
Albumin: 2.1 g/dL — ABNORMAL LOW (ref 3.5–5.0)
Alkaline Phosphatase: 116 U/L (ref 38–126)
Anion gap: 4 — ABNORMAL LOW (ref 5–15)
BUN: 24 mg/dL — ABNORMAL HIGH (ref 6–20)
CO2: 23 mmol/L (ref 22–32)
Calcium: 8.2 mg/dL — ABNORMAL LOW (ref 8.9–10.3)
Chloride: 113 mmol/L — ABNORMAL HIGH (ref 98–111)
Creatinine, Ser: 0.48 mg/dL (ref 0.44–1.00)
GFR, Estimated: >60 mL/min (ref >60)
Glucose, Bld: 69 mg/dL — ABNORMAL LOW (ref 70–99)
Potassium: 3.7 mmol/L (ref 3.5–5.1)
Sodium: 140 mmol/L (ref 135–145)
Total Bilirubin: 0.5 mg/dL (ref 0.3–1.2)
Total Protein: 6.1 g/dL — ABNORMAL LOW (ref 6.5–8.1)

## 2021-10-11 MED ORDER — POTASSIUM CHLORIDE 20 MEQ PO PACK
40.0000 meq | PACK | Freq: Once | ORAL | Status: AC
Start: 2021-10-11 — End: 2021-10-11
  Administered 2021-10-11: 40 meq via ORAL
  Filled 2021-10-11: qty 2

## 2021-10-11 MED ORDER — POTASSIUM CHLORIDE CRYS ER 20 MEQ PO TBCR
40.0000 meq | EXTENDED_RELEASE_TABLET | Freq: Once | ORAL | Status: DC
  Filled 2021-10-11: qty 2

## 2021-10-11 NOTE — Progress Notes (Signed)
PROGRESS NOTE    Terri [name]  JOI:[id number] DOB: 1975-09-23 DOA: 08/31/2021 PCP: Pcp, No   Brief Narrative:  46 year old with recurrent GI bleed, chronic microcytic anemia, HTN, polysubstance abuse, Roux-en-Y surgery admitted to the hospital initially with melanotic stool likely from chronic peptic ulcer.  Underwent endoscopy showing large anastomotic ulcer started on PPI.  While in the hospital she was found to have MSSA bacteremia from left great toe gangrene and underwent amputation.  Eventually cultures grew MRSA and later was found to have multiple liver abscesses.  She was transferred to the ICU for acute respiratory distress requiring intubation.  She also had esophageal Candida and completed a course of Diflucan.  Currently patient is on IV Unasyn until 5/29.  Echocardiogram had poor visualization of the valves and unable to perform TEE therefore with presumed endocarditis she is being treated with IV antibiotics.  PT OT recommended SNF, per TOC she should have a bed on 10/12/2021.   Assessment & Plan:  Principal Problem:   Disseminated MSSA with liver abscess, left foot osteo and abscess, 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 Active Problems:   Multilobar pneumonia with acute hypoxic respiratory failure (HCC)   Hepatic abscess   Acute respiratory failure with hypoxia (HCC)   Acute on chronic blood loss anemia   Swelling of left hand   Portal vein thrombosis   Exudative pleural effusion   Involuntary commitment   Ascites   Vaginal discharge   Polysubstance abuse (HCC)   Hypophosphatemia   Candida infection, esophageal (HCC)   Opioid abuse with opioid-induced mood disorder (HCC)   Leucocytosis   Protein-calorie malnutrition, severe   Goals of care, counseling/discussion   Drug-seeking behavior   Pressure injury of skin     Assessment and Plan: Acute hypoxic and hypercapnic respiratory failure secondary to MRSA pneumonia - Completed antibiotic treatment.  Due to decompensation she  was placed on BiPAP.  Slowly wean off as appropriate.  Bronchodilators.  I-S/flutter valve.  * Disseminated MSSA with liver abscess, left foot osteo and abscess, 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 -Think initially this started with left toe osteo requiring amputation and I&D on 4/27.  Echocardiogram showed poor visualization of the valves but TEE was canceled due to respiratory decompensation.  Currently being treated with IV Unasyn with presumed endocarditis for total of 6 weeks, last day 5/29.  Surveillance culture appears to have cleared bacteremia for now. -Will likely require repeat CT or MRI liver in 4-6 weeks time to reassess abscess -Seen by speech therapist-recommending regular thin liquid.  Patient has met all the goals.  Acute upper GI bleed, resolved.  Hemoglobin is stable --resolved. EGD 4/13 with esophageal plaques c/w candidiasis, large cratered non bleeding jejunal ulcer with non stigmata of bleeding.  Nodular regions more like regenerative tissue and not like visible vessels. PPI BID x2 months, sucralfate QID x2 weeks  Hepatic abscess --abx as above  Multilobar pneumonia with acute hypoxic respiratory failure ([place]) -As mentioned above  Severe protein calorie malnutrition - Encourage oral intake.  Seen by dietitian.  Osteomyelitis of great toe of left foot (HCC)-resolved as of 09/19/2021 --OK to ambulate with heel weightbearing in CAM boot or postop shoe --bedside nursing to do daily dry dressing changes- -VTE prophylaxis (Ortho recs Aspirin 81 mg BID x 6 wk or per primary team preference) --follow up with Ortho within 1 week from discharge from hospital for wound check [phone] for appt) --sutures out in 3-4 weeks in outpatient office  Gangrene of toe of left foot (HCC)-resolved as of  09/19/2021 --Seen by Dr. Scot Dock on 4/15, noted dry gangrene on tip of L great toe, palpable pedal pulses, thought to have adequate circulation to heal this (unclear etiology per vascular).    --now s/p amputation by orthopedics  Acute on chronic blood loss anemia --secondary to GIB; stable  Swelling of left hand --MRI limited, no fluid collection or tenosynovitis seen  Involuntary commitment --as per psychiatry  Exudative pleural effusion --s/p thoracentesis 4/23 with 1.2 L of turbid light yellow fluid; post thora CXR with severe multilobar bilateral pneumonia, most pronounced in R mid lung, needs f/u PA/lateral in 3-4 weeks following abx therapy to ensure resolution  Portal vein thrombosis --Large burden of thrombus in central portal vein as well as R portal vein and branches --as per GI, not a candidate for anticoagulation   Vaginal discharge --currently on metronidazole for presumed BV  Ascites --2 L removed during liver bx, but analysis not performed. Repeat US didn't show enough fluid for repeat paracentesis  Hypophosphatemia --replete  Polysubstance abuse ([place]) --Encourage cessation. UDS with cocaine   Hypokalemia --replete as needed  Essential hypertension -Continue current medications  Candida infection, esophageal ([place]) -Treated with Diflucan  Suicidal ideation-resolved as of 09/17/2021 --resolved, discontinue sitter per psychiatry    DVT prophylaxis: Subcu heparin Code Status: Full code Family Communication:    Status is: Inpatient Remains inpatient appropriate because: [facility] team is working on placement at this time.  Per their note it seems like patient has a bed on 10/12/2021   Nutritional status    Signs/Symptoms: severe fat depletion, severe muscle depletion  Interventions: Ensure Enlive (each supplement provides 350kcal and 20 grams of protein), MVI, Juven, Refer to RD note for recommendations  Body mass index is 15.69 kg/m.  Pressure Injury 09/24/21 Sacrum Medial Deep Tissue Pressure Injury - Purple or maroon localized area of discolored intact skin or blood-filled blister due to damage of underlying soft tissue from pressure  and/or shear. several deep tissue injuries, dark  (Active)  09/24/21 1100  Location: Sacrum  Location Orientation: Medial  Staging: Deep Tissue Pressure Injury - Purple or maroon localized area of discolored intact skin or blood-filled blister due to damage of underlying soft tissue from pressure and/or shear.  Wound Description (Comments): several deep tissue injuries, dark purple, non-blanchable  Present on Admission: No     Pressure Injury 09/24/21 Buttocks Left;Posterior;Proximal Deep Tissue Pressure Injury - Purple or maroon localized area of discolored intact skin or blood-filled blister due to damage of underlying soft tissue from pressure and/or shear. several purple,  (Active)  09/24/21 1100  Location: Buttocks (gluteal fold)  Location Orientation: Left;Posterior;Proximal  Staging: Deep Tissue Pressure Injury - Purple or maroon localized area of discolored intact skin or blood-filled blister due to damage of underlying soft tissue from pressure and/or shear.  Wound Description (Comments): several purple, non-blanchable  Present on Admission: No     Pressure Injury 09/24/21 Vertebral column Lower Deep Tissue Pressure Injury - Purple or maroon localized area of discolored intact skin or blood-filled blister due to damage of underlying soft tissue from pressure and/or shear. spinal column deep tissue, (Active)  09/24/21 1100  Location: Vertebral column  Location Orientation: Lower  Staging: Deep Tissue Pressure Injury - Purple or maroon localized area of discolored intact skin or blood-filled blister due to damage of underlying soft tissue from pressure and/or shear.  Wound Description (Comments): spinal column deep tissue, purple, non-blanchable  Present on Admission: No     Pressure Injury 09/24/21 Vertebral column  Right;Upper Deep Tissue Pressure Injury - Purple or maroon localized area of discolored intact skin or blood-filled blister due to damage of underlying soft tissue from  pressure and/or shear. purple, non-blanchab (Active)  09/24/21 1100  Location: Vertebral column  Location Orientation: Right;Upper  Staging: Deep Tissue Pressure Injury - Purple or maroon localized area of discolored intact skin or blood-filled blister due to damage of underlying soft tissue from pressure and/or shear.  Wound Description (Comments): purple, non-blanchable  Present on Admission: No     Pressure Injury 10/07/21 Buttocks Right Stage 2 -  Partial thickness loss of dermis presenting as a shallow open injury with a red, pink wound bed without slough. (Active)  10/07/21 2300  Location: Buttocks  Location Orientation: Right  Staging: Stage 2 -  Partial thickness loss of dermis presenting as a shallow open injury with a red, pink wound bed without slough.  Wound Description (Comments):   Present on Admission: Yes        Subjective: No acute events overnight.  Seen and examined at bedside.  RN present at bedside as well. Examination: Constitutional: Cachectic frail with bilateral temporal wasting.  Chronically ill-appearing. Respiratory: Clear to auscultation bilaterally Cardiovascular: Normal sinus rhythm, no rubs Abdomen: Nontender nondistended good bowel sounds Musculoskeletal: No edema noted Skin: No rashes seen Neurologic: CN 2-12 grossly intact.  And nonfocal Psychiatric: Normal judgment and insight. Alert and oriented x 3. Normal mood.  PICC line in place.  Placed on 10/05/2021   Objective: Vitals:   10/10/21 1304 10/10/21 2100 10/11/21 0528 10/11/21 0530  BP: 121/88 (!) 135/97 (!) 141/98   Pulse: 88 87 73   Resp: _0 Temp: 97.7 F (36.5 C) 98.1 F (36.7 C) (!) 97.4 F (36.3 C)   TempSrc: Oral Oral Oral   SpO2: 95% 97% 98%   Weight:    48.2 kg  Height:        Intake/Output Summary (Last 24 hours) at 10/11/2021 1030 Last data filed at 10/11/2021 3662 Gross per 24 hour  Intake 570.19 ml  Output 200 ml  Net 370.19 ml   Filed Weights    10/09/21 0500 10/10/21 0452 10/11/21 0530  Weight: 49.5 kg 50.7 kg 48.2 kg     Data Reviewed:   CBC: Recent Labs  Lab 10/06/21 0434 10/07/21 0321 10/08/21 0244 10/09/21 0510 10/11/21 0514  WBC 3.7* 3.9* 6.9 4.6 4.6  NEUTROABS 2.3 2.3 5.4 2.8 2.9  HGB 8.1* 7.8* 8.1* 8.1* 9.5*  HCT 24.5* 23.6* 24.9* 25.1* 28.7*  MCV 79.0* 79.7* 81.1 81.8 81.3  PLT 128* 133* 153 144* 947   Basic Metabolic Panel: Recent Labs  Lab 10/05/21 0337 10/06/21 0434 10/07/21 0321 10/08/21 0244 10/09/21 0510 10/11/21 0514  NA 132* 135 139 140 136 140  K 4.3 4.4 4.4 4.4 4.0 3.7  CL 105 106 110 111 110 113*  CO2 _1 GLUCOSE 76 88 87 81 78 69*  BUN 20 25* 30* 28* 27* 24*  CREATININE 0.34* 0.41* 0.38* 0.47 0.48 0.48  CALCIUM 7.9* 7.9* 8.2* 8.1* 7.9* 8.2*  MG 1.7 2.0 2.0  --   --   --   PHOS 4.1 4.7* 4.2  --   --   --    GFR: Estimated Creatinine Clearance: 66.1 mL/min (by C-G formula based on SCr of 0.48 mg/dL). Liver Function Tests: Recent Labs  Lab 10/05/21 0337 10/07/21 0321 10/08/21 0244 10/09/21 0510 10/11/21 0514  AST _2 27  22  ALT 23 28 32 27 25  ALKPHOS 115 127* 137* 125 116  BILITOT 0.6 0.6 0.4 0.6 0.5  PROT 5.4* 5.8* 6.1* 5.9* 6.1*  ALBUMIN 2.0* 2.1* 2.2* 2.1* 2.1*   No results for input(s): LIPASE, AMYLASE in the last 168 hours. No results for input(s): AMMONIA in the last 168 hours. Coagulation Profile: No results for input(s): INR, PROTIME in the last 168 hours. Cardiac Enzymes: No results for input(s): CKTOTAL, CKMB, CKMBINDEX, TROPONINI in the last 168 hours. BNP (last 3 results) No results for input(s): PROBNP in the last 8760 hours. HbA1C: No results for input(s): HGBA1C in the last 72 hours. CBG: Recent Labs  Lab 10/05/21 0301 10/07/21 1645  GLUCAP 82 86   Lipid Profile: No results for input(s): CHOL, HDL, LDLCALC, TRIG, CHOLHDL, LDLDIRECT in the last 72 hours. Thyroid Function Tests: No results for input(s): TSH, T4TOTAL, FREET4,  T3FREE, THYROIDAB in the last 72 hours. Anemia Panel: No results for input(s): VITAMINB12, FOLATE, FERRITIN, TIBC, IRON, RETICCTPCT in the last 72 hours. Sepsis Labs: No results for input(s): PROCALCITON, LATICACIDVEN in the last 168 hours.  No results found for this or any previous visit (from the past 240 hour(s)).       Radiology Studies: No results found.      Scheduled Meds:  amLODipine  10 mg Oral Daily   calcium carbonate  1 tablet Oral TID   chlorhexidine  15 mL Mouth Rinse BID   Chlorhexidine Gluconate Cloth  6 each Topical Daily   cholecalciferol  1,000 Units Oral Daily   cloNIDine  0.1 mg Transdermal Weekly   dronabinol  5 mg Oral BID AC   feeding supplement  237 mL Oral TID BM   folic acid  1 mg Oral Daily   guaiFENesin  5 mL Oral Q6H   heparin injection (subcutaneous)  5,000 Units Subcutaneous Q8H   hydrALAZINE  50 mg Oral BID   lidocaine  1 patch Transdermal Daily   lisinopril  10 mg Oral QHS   mouth rinse  15 mL Mouth Rinse q12n4p   multivitamin with minerals  1 tablet Oral BID WC   nicotine  21 mg Transdermal Daily   nutrition supplement (JUVEN)  1 packet Oral BID BM   pantoprazole  40 mg Oral BID   potassium chloride  40 mEq Oral Once   sodium chloride flush  10-40 mL Intracatheter Q12H   sucralfate  1 g Oral TID WC & HS   thiamine  100 mg Oral Daily   Continuous Infusions:  sodium chloride 10 mL/hr at 09/30/21 0800   sodium chloride 10 mL/hr at 10/11/21 0313   ampicillin-sulbactam (UNASYN) IV 3 g (10/11/21 0851)     LOS: 40 days   Time spent= 35 mins    Terri [name] Arsenio Loader, MD [facility]  If 7PM-7AM, please contact night-coverage  10/11/2021, 10:30 AM

## 2021-10-12 ENCOUNTER — Other Ambulatory Visit (HOSPITAL_COMMUNITY): Payer: Self-pay

## 2021-10-12 LAB — CBC WITH DIFFERENTIAL/PLATELET
Abs Immature Granulocytes: 0.01 10*3/uL (ref 0.00–0.07)
Basophils Absolute: 0.1 10*3/uL (ref 0.0–0.1)
Basophils Relative: 1 %
Eosinophils Absolute: 0.1 10*3/uL (ref 0.0–0.5)
Eosinophils Relative: 2 %
HCT: 30.9 % — ABNORMAL LOW (ref 36.0–46.0)
Hemoglobin: 10.4 g/dL — ABNORMAL LOW (ref 12.0–15.0)
Immature Granulocytes: 0 %
Lymphocytes Relative: 29 %
Lymphs Abs: 1.4 10*3/uL (ref 0.7–4.0)
MCH: 27 pg (ref 26.0–34.0)
MCHC: 33.7 g/dL (ref 30.0–36.0)
MCV: 80.3 fL (ref 80.0–100.0)
Monocytes Absolute: 0.3 10*3/uL (ref 0.1–1.0)
Monocytes Relative: 5 %
Neutro Abs: 3.1 10*3/uL (ref 1.7–7.7)
Neutrophils Relative %: 63 %
Platelets: 179 10*3/uL (ref 150–400)
RBC: 3.85 MIL/uL — ABNORMAL LOW (ref 3.87–5.11)
RDW: 22.8 % — ABNORMAL HIGH (ref 11.5–15.5)
WBC: 4.9 10*3/uL (ref 4.0–10.5)
nRBC: 0 % (ref 0.0–0.2)

## 2021-10-12 MED ORDER — SUCRALFATE 1 GM/10ML PO SUSP: 1.0000 g | Freq: Three times a day (TID) | ORAL | 0 refills | Status: DC

## 2021-10-12 MED ORDER — CYCLOBENZAPRINE HCL 5 MG PO TABS
5.0000 mg | ORAL_TABLET | Freq: Three times a day (TID) | ORAL | 0 refills | Status: AC | PRN
Start: 2021-10-12 — End: ?

## 2021-10-12 MED ORDER — LISINOPRIL 10 MG PO TABS: 10.0000 mg | ORAL_TABLET | Freq: Every day | ORAL | Status: AC

## 2021-10-12 MED ORDER — CYCLOBENZAPRINE HCL 5 MG PO TABS: 5.0000 mg | ORAL_TABLET | Freq: Three times a day (TID) | ORAL | 0 refills | Status: DC | PRN

## 2021-10-12 MED ORDER — THIAMINE HCL 100 MG PO TABS: 100.0000 mg | ORAL_TABLET | Freq: Every day | ORAL | Status: AC

## 2021-10-12 MED ORDER — CALCIUM CARBONATE ANTACID 500 MG PO CHEW: 1.0000 | CHEWABLE_TABLET | Freq: Three times a day (TID) | ORAL | Status: AC

## 2021-10-12 MED ORDER — DRONABINOL 5 MG PO CAPS: 5.0000 mg | ORAL_CAPSULE | Freq: Two times a day (BID) | ORAL | Status: AC

## 2021-10-12 MED ORDER — FOLIC ACID 1 MG PO TABS: 1.0000 mg | ORAL_TABLET | Freq: Every day | ORAL | Status: AC

## 2021-10-12 MED ORDER — PANTOPRAZOLE SODIUM 40 MG PO TBEC: 40.0000 mg | DELAYED_RELEASE_TABLET | Freq: Every day | ORAL | Status: AC

## 2021-10-12 MED ORDER — CLONIDINE 0.1 MG/24HR TD PTWK
0.1000 mg | MEDICATED_PATCH | TRANSDERMAL | 12 refills | Status: AC
Start: 2021-10-16 — End: ?

## 2021-10-12 MED ORDER — HYDROCODONE-ACETAMINOPHEN 7.5-325 MG PO TABS: 1.0000 | ORAL_TABLET | Freq: Four times a day (QID) | ORAL | 0 refills | Status: AC | PRN

## 2021-10-12 MED ORDER — LINEZOLID 600 MG PO TABS
600.0000 mg | ORAL_TABLET | Freq: Two times a day (BID) | ORAL | 0 refills | Status: AC
Start: 2021-10-12 — End: 2021-10-19

## 2021-10-12 NOTE — TOC Transition Note (Signed)
Transition of Care [facility]) - CM/SW Discharge Note   Patient Details  Name: Terri [name] MRN: [id number] Date of Birth: May 07, 1976  [facility] CM/SW Contact:  Lynnell Catalan, RN Phone Number: 10/12/2021, 11:56 AM   Clinical Narrative:     Pt to dc to [facility] today. Daughter Gillie Manners called to alert of dc. [facility] to transport pt. RN to call report to [phone].   Patient Goals and CMS Choice Patient states their goals for this hospitalization and ongoing recovery are:: Feel better     Discharge Plan and Services   Discharge Planning Services: CM Consult              Readmission Risk Interventions    10/09/2021   11:36 AM 09/02/2021    2:25 PM  Readmission Risk Prevention Plan  Transportation Screening  Complete  Medication Review ([place])  Complete  PCP or Specialist appointment within 3-5 days of discharge  Complete  HRI or [place] Complete   SW Recovery Care/Counseling Consult Complete   Palliative Care Screening Not Applicable Not [place] Complete Not Applicable

## 2021-10-12 NOTE — Discharge Summary (Addendum)
Physician Discharge Summary  Terri [name]:[id number] DOB: [birth year] DOA: 08/31/2021  PCP: Pcp, No  Admit date: 08/31/2021 Discharge date: 10/12/2021  Admitted From: Home Disposition: SNF  Recommendations for Outpatient Follow-up:  Follow up with PCP in 1-2 weeks Please obtain BMP/CBC/magnesium and phosphorus in one week your next doctors visit.  Patient will be on oral linezolid until 10/19/2021.  Thereafter will require repeat CT or MRI liver to reassess abscess. Follow-up outpatient infectious disease in next 2-3 weeks.  Do not give cyclobenzaprine while patient is on linezolid.  Cyclobenzaprine can be started at.  She has completed treatment with linezolid. Continue PPI as prescribed.  No longer on Carafate, completed 2 weeks. Encourage oral intake Outpatient follow-up with Dr. Kathaleen Bury from orthopedic in about 1 week. Okay to ambulate with heel weightbearing in cam boot or postop shoes   Discharge Condition: Stable CODE STATUS: Full code Diet recommendation:   Brief/Interim Summary: 46 year old with recurrent GI bleed, chronic microcytic anemia, HTN, polysubstance abuse, Roux-en-Y surgery admitted to the hospital initially with melanotic stool likely from chronic peptic ulcer.  Underwent endoscopy showing large anastomotic ulcer started on PPI.  While in the hospital she was found to have MSSA bacteremia from left great toe gangrene and underwent amputation.  Eventually cultures grew MRSA and later was found to have multiple liver abscesses.  She was transferred to the ICU for acute respiratory distress requiring intubation.  She also had esophageal Candida and completed a course of Diflucan.  Currently patient is on IV Unasyn until 5/29.  Echocardiogram had poor visualization of the valves and unable to perform TEE therefore with presumed endocarditis she is being treated with IV antibiotics.  PT OT recommended SNF, per TOC she should have a bed on 10/12/2021. Rest of the  recommendations as stated above.   Assessment and Plan: Assessment and Plan: Acute hypoxic and hypercapnic respiratory failure secondary to MRSA pneumonia - Completed antibiotic treatment.  Due to decompensation she was placed on BiPAP.  Slowly wean off as appropriate.  Bronchodilators.   * Disseminated MSSA with liver abscess, left foot osteo and abscess, 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 -Think initially this started with left toe osteo requiring amputation and I&D on 4/27.  Echocardiogram showed poor visualization of the valves but TEE was canceled due to respiratory decompensation.  Currently being treated with IV Unasyn with presumed endocarditis for total of 6 weeks, last day 5/29.  Surveillance culture appears to have cleared bacteremia for now.  Spoke with infectious disease on the day of discharge, will transition to oral linezolid until 5/29.  Okay to remove PICC line.  Follow-up outpatient infectious disease in next 2-3 weeks - Repeat CT or MRI of the liver to reassess liver abscess in about 1 week after completion of antibiotic course. -Seen by speech therapist-recommending regular thin liquid.  Patient has met all the goals.   Acute upper GI bleed, resolved.  Hemoglobin is stable --resolved. EGD 4/13 with esophageal plaques c/w candidiasis, large cratered non bleeding jejunal ulcer with non stigmata of bleeding.  Nodular regions more like regenerative tissue and not like visible vessels. Completed course of Carafate.  Continue PPI.   Hepatic abscess --abx as above   Multilobar pneumonia with acute hypoxic respiratory failure ([place]) -As mentioned above   Severe protein calorie malnutrition - Encourage oral intake.  Seen by dietitian.   Osteomyelitis of great toe of left foot (HCC)-resolved as of 09/19/2021 --OK to ambulate with heel weightbearing in CAM boot or postop shoe --bedside nursing to  do daily dry dressing changes- -VTE prophylaxis (Ortho recs Aspirin 81 mg BID x 6 wk or per  primary team preference) --follow up with Ortho within 1 week from discharge from hospital for wound check ([phone] for appt) --sutures out in 3-4 weeks in outpatient office   Gangrene of toe of left foot (HCC)-resolved as of 09/19/2021 --Seen by Dr. Scot Dock on 4/15, noted dry gangrene on tip of L great toe, palpable pedal pulses, thought to have adequate circulation to heal this (unclear etiology per vascular).   --now s/p amputation by orthopedics   Acute on chronic blood loss anemia --secondary to GIB; stable   Swelling of left hand --MRI limited, no fluid collection or tenosynovitis seen   Involuntary commitment --as per psychiatry   Exudative pleural effusion --s/p thoracentesis 4/23 with 1.2 L of turbid light yellow fluid; post thora CXR with severe multilobar bilateral pneumonia, most pronounced in R mid lung, needs f/u PA/lateral in 3-4 weeks following abx therapy to ensure resolution   Portal vein thrombosis --Large burden of thrombus in central portal vein as well as R portal vein and branches --as per GI, not a candidate for anticoagulation    Vaginal discharge -s/p treatment.    Ascites --2 L removed during liver bx, but analysis not performed. Repeat US didn't show enough fluid for repeat paracentesis   Hypophosphatemia --replete   Polysubstance abuse ([place]) --Encourage cessation. UDS with cocaine     Hypokalemia --replete as needed   Essential hypertension -Continue current medications   Candida infection, esophageal ([place]) -Treated with Diflucan   Suicidal ideation-resolved as of 09/17/2021 --resolved, discontinue sitter per psychiatry         Body mass index is 15.69 kg/m.  Pressure Injury 09/24/21 Sacrum Medial Deep Tissue Pressure Injury - Purple or maroon localized area of discolored intact skin or blood-filled blister due to damage of underlying soft tissue from pressure and/or shear. several deep tissue injuries, dark  (Active)  09/24/21  1100  Location: Sacrum  Location Orientation: Medial  Staging: Deep Tissue Pressure Injury - Purple or maroon localized area of discolored intact skin or blood-filled blister due to damage of underlying soft tissue from pressure and/or shear.  Wound Description (Comments): several deep tissue injuries, dark purple, non-blanchable  Present on Admission: No     Pressure Injury 09/24/21 Buttocks Left;Posterior;Proximal Deep Tissue Pressure Injury - Purple or maroon localized area of discolored intact skin or blood-filled blister due to damage of underlying soft tissue from pressure and/or shear. several purple,  (Active)  09/24/21 1100  Location: Buttocks (gluteal fold)  Location Orientation: Left;Posterior;Proximal  Staging: Deep Tissue Pressure Injury - Purple or maroon localized area of discolored intact skin or blood-filled blister due to damage of underlying soft tissue from pressure and/or shear.  Wound Description (Comments): several purple, non-blanchable  Present on Admission: No     Pressure Injury 09/24/21 Vertebral column Lower Deep Tissue Pressure Injury - Purple or maroon localized area of discolored intact skin or blood-filled blister due to damage of underlying soft tissue from pressure and/or shear. spinal column deep tissue, (Active)  09/24/21 1100  Location: Vertebral column  Location Orientation: Lower  Staging: Deep Tissue Pressure Injury - Purple or maroon localized area of discolored intact skin or blood-filled blister due to damage of underlying soft tissue from pressure and/or shear.  Wound Description (Comments): spinal column deep tissue, purple, non-blanchable  Present on Admission: No     Pressure Injury 09/24/21 Vertebral column Right;Upper Deep Tissue Pressure Injury -  Purple or maroon localized area of discolored intact skin or blood-filled blister due to damage of underlying soft tissue from pressure and/or shear. purple, non-blanchab (Active)  09/24/21 1100   Location: Vertebral column  Location Orientation: Right;Upper  Staging: Deep Tissue Pressure Injury - Purple or maroon localized area of discolored intact skin or blood-filled blister due to damage of underlying soft tissue from pressure and/or shear.  Wound Description (Comments): purple, non-blanchable  Present on Admission: No     Pressure Injury 10/07/21 Buttocks Right Stage 2 -  Partial thickness loss of dermis presenting as a shallow open injury with a red, pink wound bed without slough. (Active)  10/07/21 2300  Location: Buttocks  Location Orientation: Right  Staging: Stage 2 -  Partial thickness loss of dermis presenting as a shallow open injury with a red, pink wound bed without slough.  Wound Description (Comments):   Present on Admission: Yes      Discharge Diagnoses:  Principal Problem:   Disseminated MSSA with liver abscess, left foot osteo and abscess, 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 Active Problems:   Multilobar pneumonia with acute hypoxic respiratory failure (HCC)   Hepatic abscess   Acute respiratory failure with hypoxia (HCC)   Acute on chronic blood loss anemia   Swelling of left hand   Portal vein thrombosis   Exudative pleural effusion   Involuntary commitment   Ascites   Vaginal discharge   Polysubstance abuse (HCC)   Hypophosphatemia   Candida infection, esophageal (HCC)   Opioid abuse with opioid-induced mood disorder (HCC)   Leucocytosis   Protein-calorie malnutrition, severe   Goals of care, counseling/discussion   Drug-seeking behavior   Pressure injury of skin      Consultations: Ortho ID Gastroenterology  Subjective: No complaints, feels ok  Discharge Exam: Vitals:   10/11/21 2027 10/12/21 0439  BP: (!) 140/101 (!) 147/90  Pulse: 76 73  Resp: 16 18  Temp: 97.9 F (36.6 C) 97.9 F (36.6 C)  SpO2: 94% 93%   Vitals:   10/11/21 0530 10/11/21 1424 10/11/21 2027 10/12/21 0439  BP:  (!) 130/95 (!) 140/101 (!) 147/90  Pulse:  87 76  73  Resp:  $Remo'18 16 18  'TDZyh$ Temp:  (!) 97.5 F (36.4 C) 97.9 F (36.6 C) 97.9 F (36.6 C)  TempSrc:  Oral Oral Oral  SpO2:  100% 94% 93%  Weight: 48.2 kg     Height:        General: Pt is alert, awake, not in acute distress; cachectic b/l temp wasting  Cardiovascular: RRR, S1/S2 +, no rubs, no gallops Respiratory: CTA bilaterally, no wheezing, no rhonchi Abdominal: Soft, NT, ND, bowel sounds + Extremities: no edema, no cyanosis  Discharge Instructions   Allergies as of 10/12/2021       Reactions   Nsaids Other (See Comments)   Stomach ulcer   Ketorolac Tromethamine Itching, Nausea And Vomiting        Medication List     STOP taking these medications    acetaminophen 500 MG tablet Commonly known as: TYLENOL   amLODipine 10 MG tablet Commonly known as: NORVASC   hydrALAZINE 50 MG tablet Commonly known as: APRESOLINE   hydrOXYzine 25 MG tablet Commonly known as: ATARAX   Iron 18 MG Tbcr   nicotine 21 mg/24hr patch Commonly known as: NICODERM CQ - dosed in mg/24 hours   ondansetron 4 MG disintegrating tablet Commonly known as: ZOFRAN-ODT   vitamin B-12 1000 MCG tablet Commonly known as: CYANOCOBALAMIN  TAKE these medications    calcium carbonate 500 MG chewable tablet Commonly known as: TUMS - dosed in mg elemental calcium Chew 1 tablet (200 mg of elemental calcium total) by mouth 3 (three) times daily.   cloNIDine 0.1 mg/24hr patch Commonly known as: CATAPRES - Dosed in mg/24 hr Place 1 patch (0.1 mg total) onto the skin once a week. Start taking on: Oct 16, 2021   cyclobenzaprine 5 MG tablet Commonly known as: FLEXERIL Take 1 tablet (5 mg total) by mouth 3 (three) times daily as needed for muscle spasms.   dronabinol 5 MG capsule Commonly known as: MARINOL Take 1 capsule (5 mg total) by mouth 2 (two) times daily before lunch and supper.   folic acid 1 MG tablet Commonly known as: FOLVITE Take 1 tablet (1 mg total) by mouth daily. Start  taking on: Oct 13, 2021   HYDROcodone-acetaminophen 7.5-325 MG tablet Commonly known as: NORCO Take 1 tablet by mouth every 6 (six) hours as needed for moderate pain or severe pain.   linezolid 600 MG tablet Commonly known as: ZYVOX Take 1 tablet (600 mg total) by mouth 2 (two) times daily for 7 days.   lisinopril 10 MG tablet Commonly known as: ZESTRIL Take 1 tablet (10 mg total) by mouth at bedtime. What changed: when to take this   pantoprazole 40 MG tablet Commonly known as: PROTONIX Take 1 tablet (40 mg total) by mouth daily before breakfast. What changed: when to take this   thiamine 100 MG tablet Take 1 tablet (100 mg total) by mouth daily. Start taking on: Oct 13, 2021        Contact information for follow-up providers     Mignon Pine, DO Follow up in 2 week(s).   Specialties: Infectious Diseases, Internal Medicine Contact information: [address] [place] [address] [phone]         Armond Hang, MD. Schedule an appointment as soon as possible for a visit in 1 week(s).   Specialty: Orthopedic Surgery Why: For Wound Check Contact information: [address], [place] [address] [phone]              Contact information for after-discharge care     Destination     [facility] Preferred SNF .   Service: Skilled Nursing Contact information: [facility] [phone]                    Allergies  Allergen Reactions   Nsaids Other (See Comments)    Stomach ulcer    Ketorolac Tromethamine Itching and Nausea And Vomiting    You were cared for by a hospitalist during your hospital stay. If you have any questions about your discharge medications or the care you received while you were in the hospital after you are discharged, you can call the unit and asked to speak with the hospitalist on call if the hospitalist that took  care of you is not available. Once you are discharged, your primary care physician will handle any further medical issues. Please note that no refills for any discharge medications will be authorized once you are discharged, as it is imperative that you return to your primary care physician (or establish a relationship with a primary care physician if you do not have one) for your aftercare needs so that they can reassess your need for medications and monitor your lab values.   Procedures/Studies: [employer]  Chest 1 View  Result Date: 09/13/2021 CLINICAL DATA:  46 year old female status post thoracentesis. EXAM: CHEST  1 VIEW COMPARISON:  Chest x-ray 09/12/2021. FINDINGS: Previously noted right-sided pleural effusion has nearly completely resolved following thoracentesis. No appreciable pneumothorax. Extensive airspace consolidation is noted throughout the right lung, most evident in the right mid lung. Patchy ill-defined airspace disease also noted in the medial aspect of the left lung base. No left pleural effusion. No pneumothorax. No evidence of pulmonary edema. Heart size is normal. Upper mediastinal contours are within normal limits. IMPRESSION: 1. Near complete resolution of right-sided pleural effusion following thoracentesis. No postprocedural pneumothorax. 2. Severe multilobar bilateral pneumonia, most pronounced in the right mid lung. Followup PA and lateral chest X-ray is recommended in 3-4 weeks following trial of antibiotic therapy to ensure resolution and exclude underlying malignancy. Electronically Signed   By: Vinnie Langton M.D.   On: 09/13/2021 11:28   DG Abd 1 View  Result Date: 09/27/2021 CLINICAL DATA:  Feeding tube placement EXAM: ABDOMEN - 1 VIEW COMPARISON:  Earlier same day FINDINGS: Soft feeding tube tip is very similar, located at the gastroesophageal junction in the region of previous anastomotic sutures. IMPRESSION: No change. Tube tip at the gastroesophageal junction, in the region  of previous anastomotic sutures. Electronically Signed   By: Nelson Chimes M.D.   On: 09/27/2021 11:44   DG Abd 1 View  Result Date: 09/22/2021 CLINICAL DATA:  NG tube placement EXAM: ABDOMEN - 1 VIEW COMPARISON:  09/20/2021 FINDINGS: Dobbhoff tube tip located in the region of the proximal stomach, similar to prior study. Dense airspace opacity again seen in the right medial lung. No dilated loops of bowel identified within the visualized abdomen. IMPRESSION: Tip of feeding tube located in the region of the proximal stomach, similar to prior study. Electronically Signed   By: Miachel Roux M.D.   On: 09/22/2021 08:03   DG Abd 1 View  Result Date: 09/20/2021 CLINICAL DATA:  NG tube placement EXAM: ABDOMEN - 1 VIEW COMPARISON:  09/20/2021 FINDINGS: Limited radiograph of the lower chest and upper abdomen was obtained for the purposes of enteric tube localization. Weighted enteric tube is seen coursing below the diaphragm with distal tip terminating within the expected location of the gastric body. IMPRESSION: Enteric tube positioned within the gastric body. Electronically Signed   By: Davina Poke D.O.   On: 09/20/2021 10:08   DG Abd 1 View  Result Date: 09/20/2021 CLINICAL DATA:  NG tube placement.  Post advancement. EXAM: ABDOMEN - 1 VIEW COMPARISON:  September 20, 2021. FINDINGS: RIGHT-sided effusion and basilar airspace disease as before. Gastric tube in place is slightly more proximal when compared to the prior study. Side port above GE junction. Limited assessment of bowel gas pattern is stable. EKG leads project over the upper abdomen and lower chest. On limited assessment there is no acute skeletal process. IMPRESSION: 1. Gastric tube is slightly more proximal when compared to the prior study. Side port above GE junction. This could be advanced based on tip position approximately 14 cm but should be correlated perhaps with radiograph of the chest or neck to ensure no substantial coiling or redundancy  in these areas based on history of interval advancement. 2. RIGHT-sided effusion and basilar airspace disease as before. These results will be called to the ordering clinician or representative by the Radiologist Assistant, and communication documented in the PACS or [employer]. Electronically Signed   By: Zetta Bills M.D.   On: 09/20/2021 09:00  DG Abd 1 View  Result Date: 09/20/2021 CLINICAL DATA:  Check NG placement EXAM: ABDOMEN - 1 VIEW COMPARISON:  None. FINDINGS: Gastric catheter extends with the tip in the stomach although the proximal side port lies in the distal esophagus. This should be advanced several cm deeper into the stomach. Patchy infiltrates are again noted in the right lung. IMPRESSION: Gastric catheter as described. This should be advanced deeper into the stomach. Electronically Signed   By: Inez Catalina M.D.   On: 09/20/2021 01:59   CT CHEST W CONTRAST  Result Date: 09/18/2021 CLINICAL DATA:  Pneumonia. EXAM: CT CHEST WITH CONTRAST TECHNIQUE: Multidetector CT imaging of the chest was performed during intravenous contrast administration. RADIATION DOSE REDUCTION: This exam was performed according to the departmental dose-optimization program which includes automated exposure control, adjustment of the mA and/or kV according to patient size and/or use of iterative reconstruction technique. CONTRAST:  47mL OMNIPAQUE IOHEXOL 300 MG/ML  SOLN COMPARISON:  June 23, 2021 FINDINGS: Cardiovascular: No significant vascular findings. Normal heart size. No pericardial effusion. Mediastinum/Nodes: No enlarged mediastinal, hilar, or axillary lymph nodes. Thyroid gland, trachea, and esophagus demonstrate no significant findings. Lungs/Pleura: Very large areas of consolidation are seen throughout the right upper lobe, right middle lobe and right lower lobe. Marked severity multifocal bilateral infiltrates are also seen. This represents a new finding when compared to the prior study. There  is a small left pleural effusion. A moderate sized right pleural effusion is also noted. No pneumothorax is identified. Upper Abdomen: Multiple surgical clips are seen within the gallbladder fossa. There is mild central intrahepatic biliary dilatation. Multiple ill-defined low-attenuation liver lesions are seen within the right lobe of the liver. The largest measures approximately 3.0 cm x 2.6 cm (axial CT image 162, CT series 2). These are not visualized on the prior study. Surgical sutures are seen along the gastric region. Marked severity ascites is seen within the visualized portion of the abdomen. Musculoskeletal: There is marked severity anasarca throughout the chest wall. No acute or significant osseous findings. IMPRESSION: 1. Very large areas of consolidation throughout the right upper lobe, right middle lobe and right lower lobe, with marked severity multifocal bilateral infiltrates. While this is likely, in part, infectious in etiology, an underlying neoplastic process cannot be excluded. 2. Bilateral pleural effusions, right greater than left. 3. Multiple ill-defined low-attenuation liver lesions within the right lobe of the liver which are not visualized on the prior study. These are concerning for metastatic disease. Correlation with MRI is recommended. 4. Marked severity anasarca. 5. Ascites. Electronically Signed   By: Virgina Norfolk M.D.   On: 09/18/2021 22:28   MR THORACIC SPINE W WO CONTRAST  Result Date: 10/02/2021 CLINICAL DATA:  Mid back pain with infection suspected. History of disseminated staph infection. EXAM: MRI THORACIC AND LUMBAR SPINE WITHOUT AND WITH CONTRAST TECHNIQUE: Multiplanar and multiecho pulse sequences of the thoracic and lumbar spine were obtained without and with intravenous contrast. CONTRAST:  49mL GADAVIST GADOBUTROL 1 MMOL/ML IV SOLN COMPARISON:  09/09/2021 FINDINGS: MRI THORACIC SPINE FINDINGS Alignment:  Physiologic. Vertebrae: No fracture, evidence of discitis,  or bone lesion. Cord: Normal signal and morphology. No visible spinal canal collection. Paraspinal and other soft tissues: Layering pleural effusions, moderate on the right and small on the left. Disc levels: T1-2 right paracentral disc protrusion. T2-3 bilateral paracentral protrusion. T3-4 shallow central disc protrusion. T4-5 right paracentral protrusion which could contact the intradural new root. T5-6 small right paracentral protrusion. T6-7 central and right paracentral  protrusions. T7-8 left paracentral protrusion contacting the ventral cord. T9-10 bilateral paracentral protrusion, larger on the left where there could be nerve root contact T10-11 tiny left paracentral protrusion. T11-12 mild disc bulging. The facets are diffusely negative.  No foraminal impingement. MRI LUMBAR SPINE FINDINGS Segmentation:  5 lumbar type vertebrae Alignment:  Physiologic. Vertebrae: Edematous appearance at the endplates of X3-8, also seen on prior and occult by STIR imaging due to saturation quality. No change or avid enhancement to imply osteomyelitis at this level or elsewhere. Conus medullaris: Extends to the L1 level and appears normal. Paraspinal and other soft tissues: Failure of fat saturation and diffuse T2 hyperintense appearance of intrinsic back muscles, also seen on prior. T2 hyperintense nonenhancing lesion in the posterior right lobe liver, similar to 09/04/2021 abdominal MRI Disc levels: L3-L4: Disc collapse and endplate degeneration with disc bulging and facet spurring. The canal and foramina are patent L4-L5: Disc narrowing and bulging with endplate and facet spurring. Prior decompression with patent spinal canal L5-S1:Narrowed disc with endplate and facet spurring. Patent canal and foramina after posterior decompression. IMPRESSION: 1. Stable compared to April 2023. No evidence of thoracolumbar spinal infection. 2. 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 No high-grade canal or foraminal stenosis.  Electronically Signed   By: Jorje Guild M.D.   On: 10/02/2021 12:13   MR Lumbar Spine W Wo Contrast  Result Date: 10/02/2021 CLINICAL DATA:  Mid back pain with infection suspected. History of disseminated staph infection. EXAM: MRI THORACIC AND LUMBAR SPINE WITHOUT AND WITH CONTRAST TECHNIQUE: Multiplanar and multiecho pulse sequences of the thoracic and lumbar spine were obtained without and with intravenous contrast. CONTRAST:  61mL GADAVIST GADOBUTROL 1 MMOL/ML IV SOLN COMPARISON:  09/09/2021 FINDINGS: MRI THORACIC SPINE FINDINGS Alignment:  Physiologic. Vertebrae: No fracture, evidence of discitis, or bone lesion. Cord: Normal signal and morphology. No visible spinal canal collection. Paraspinal and other soft tissues: Layering pleural effusions, moderate on the right and small on the left. Disc levels: T1-2 right paracentral disc protrusion. T2-3 bilateral paracentral protrusion. T3-4 shallow central disc protrusion. T4-5 right paracentral protrusion which could contact the intradural new root. T5-6 small right paracentral protrusion. T6-7 central and right paracentral protrusions. T7-8 left paracentral protrusion contacting the ventral cord. T9-10 bilateral paracentral protrusion, larger on the left where there could be nerve root contact T10-11 tiny left paracentral protrusion. T11-12 mild disc bulging. The facets are diffusely negative.  No foraminal impingement. MRI LUMBAR SPINE FINDINGS Segmentation:  5 lumbar type vertebrae Alignment:  Physiologic. Vertebrae: Edematous appearance at the endplates of H8-2, also seen on prior and occult by STIR imaging due to saturation quality. No change or avid enhancement to imply osteomyelitis at this level or elsewhere. Conus medullaris: Extends to the L1 level and appears normal. Paraspinal and other soft tissues: Failure of fat saturation and diffuse T2 hyperintense appearance of intrinsic back muscles, also seen on prior. T2 hyperintense nonenhancing lesion  in the posterior right lobe liver, similar to 09/04/2021 abdominal MRI Disc levels: L3-L4: Disc collapse and endplate degeneration with disc bulging and facet spurring. The canal and foramina are patent L4-L5: Disc narrowing and bulging with endplate and facet spurring. Prior decompression with patent spinal canal L5-S1:Narrowed disc with endplate and facet spurring. Patent canal and foramina after posterior decompression. IMPRESSION: 1. Stable compared to April 2023. No evidence of thoracolumbar spinal infection. 2. 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 No high-grade canal or foraminal stenosis. Electronically Signed   By: Gilford Silvius.D.  On: 10/02/2021 12:13   MR HAND LEFT W WO CONTRAST  Result Date: 09/12/2021 CLINICAL DATA:  Swelling of left hand.  Best images obtained. EXAM: MRI OF THE LEFT HAND WITHOUT AND WITH CONTRAST TECHNIQUE: Multiplanar, multisequence MR imaging of the left hand was performed before and after the administration of intravenous contrast. Best images obtained. Patient was placed in right lateral decubitus position for imaging. Patient unable to tolerate any other position. The technologist had to repeat scout images for the postcontrast sequences due to patient movement. CONTRAST:  1mL GADAVIST GADOBUTROL 1 MMOL/ML IV SOLN COMPARISON:  None available FINDINGS: The technologist reports this study was limited due to patient having to be positioned in a lateral decubitus position. There is minimal fat saturation on the coronal T1 fat sat postcontrast, coronal STIR sagittal proton density fat saturation trauma and axial T1 fat saturation postcontrast sequences. Additionally, there is unexpected fat saturation seen on the axial T1 and coronal T1 weighted sequences. Bones/Joint/Cartilage Within the limitations described above, no acute fracture is seen. Moderate to severe thumb carpometacarpal joint space narrowing and peripheral osteophytosis. Likely moderate triscaphe  joint space narrowing. Mild fifth finger PIP medial apex angulation with likely mild joint space narrowing and peripheral osteophytosis degenerative changes. Ligaments The medial and ulnar collateral ligaments of the visualized fingers appear grossly intact. Muscles and Tendons Within the limitations of decreased signal to noise, the dorsal extensor tendon compartments and more distal dorsal finger extensor tendons appear intact. The flexor tendons and flexor retinaculum appear intact. No definite tenosynovitis. Soft tissues No fluid collection is seen. IMPRESSION:: IMPRESSION: 1. Limited study due to patient condition and positioning, inhomogeneous fat saturation, and nonconventional fat saturation of some precontrast sequences and non-fat saturation of some post-contrast sequences. 2. 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 3. No definite fluid collection or tenosynovitis is seen. Electronically Signed   By: Yvonne Kendall M.D.   On: 09/12/2021 19:13   MR FOOT LEFT W WO CONTRAST  Result Date: 09/16/2021 CLINICAL DATA:  Foot swelling, nondiabetic, osteomyelitis suspected swelling first toe EXAM: MRI OF THE LEFT FOREFOOT WITHOUT AND WITH CONTRAST TECHNIQUE: Multiplanar, multisequence MR imaging of the left forefoot was performed both before and after administration of intravenous contrast. CONTRAST:  2mL GADAVIST GADOBUTROL 1 MMOL/ML IV SOLN COMPARISON:  Foot radiograph 08/27/2021. FINDINGS: There is failure of fat saturation and fat inversion limiting evaluation on T2 weighted and STIR sequences. Bones/Joint/Cartilage There is bony irregularity of the distal phalanx of the great toe, and abnormal marrow signal within the distal phalanx and distal aspect of the proximal phalanx. Ligaments Intact MTP collateral ligaments.  Intact Lisfranc ligament. Muscles and Tendons There is rim enhancing fluid surrounding the flexor hallucis longus tendon at the level of the mid  metatarsal (axial T1 postcontrast image 32). There is also abnormal signal of the flexor hallucis longus tendon just proximal to the sesamoids. Soft tissues There is a soft tissue ulcer at the tip of the great toe, with underlying non enhancement of the skin and underlying soft tissues suggesting necrosis. There is an fluid tracking from the ulcer in the proximal direction along the great toe, with rim enhancing fluid extending to the level of the mid metatarsal superficially and along the flexor hallucis longus tendon in the deeper tissues (sagittal postcontrast image 8). Overall this probable abscess measures up to 1.6 cm short axis and 7.3 cm in length (series 11, image 23, series 12, image 8). IMPRESSION: Poor image quality due to failure of  fat signal suppression. Probable osteomyelitis of the distal phalanx of the great toe and questionable osteomyelitis involving the distal aspect of the proximal phalanx. Soft tissue necrosis at the tip of the great toe with rim enhancing abscess extending from the great toe ulceration proximally to the level of the mid metatarsal and with deeper tenosynovial involvement of the flexor hallucis longus tendon in the midfoot. Overall this abscess measures up to 1.6 cm short axis and 7.3 cm in length. Electronically Signed   By: Maurine Simmering M.D.   On: 09/16/2021 08:30   DG CHEST PORT 1 VIEW  Result Date: 10/07/2021 CLINICAL DATA:  Respiratory compromise EXAM: PORTABLE CHEST 1 VIEW COMPARISON:  10/05/2021 FINDINGS: Cardiac shadow is stable. Right-sided PICC is again seen and stable. The lungs are hyperinflated. Patchy airspace opacity is noted in the bases bilaterally consistent with atelectasis. No bony abnormality is noted. IMPRESSION: Slight increase in basilar atelectasis. Electronically Signed   By: Inez Catalina M.D.   On: 10/07/2021 22:47   DG CHEST PORT 1 VIEW  Result Date: 10/05/2021 CLINICAL DATA:  Dark stool.  Chronic anemia. EXAM: PORTABLE CHEST 1 VIEW  COMPARISON:  Radiograph 09/27/2021 FINDINGS: Normal cardiac silhouette. RIGHT central venous line tip in distal SVC. Lungs are hyperinflated. Small bilateral pleural effusions. Mild airspace disease in the RIGHT lower lobe is improved from comparison exam. IMPRESSION: 1. Improvement in RIGHT lower lobe airspace disease. 2. Hyperinflated lungs. 3. Small effusions. Electronically Signed   By: Suzy Bouchard M.D.   On: 10/05/2021 14:08   DG CHEST PORT 1 VIEW  Result Date: 09/27/2021 CLINICAL DATA:  Acute respiratory failure EXAM: PORTABLE CHEST 1 VIEW COMPARISON:  Sep 26, 2021 FINDINGS: A right PICC line remains in good position. No pneumothorax. A nipple shadow projects over the left base. Mild infiltrate in the lateral left base is stable. Effusion and underlying opacity on the right is similar to mildly worsened. Mild right perihilar opacity is worsened. The cardiomediastinal silhouette is stable. IMPRESSION: 1. Stable right PICC line. 2. Stable infiltrate in the lateral left lung base. 3. Small layering right effusion with underlying opacity is stable to mildly more prominent. There is new right perihilar opacity compared to the previous study. Electronically Signed   By: Dorise Bullion III M.D.   On: 09/27/2021 10:06   DG CHEST PORT 1 VIEW  Result Date: 09/26/2021 CLINICAL DATA:  Acute respiratory failure EXAM: PORTABLE CHEST 1 VIEW COMPARISON:  Chest x-ray dated Sep 25, 2021 FINDINGS: Interval removal of ET tube and feeding tube. Cardiac and mediastinal contours are within normal limits. Unchanged right apical and basilar consolidations. Stable small right pleural effusion. No evidence of pneumothorax. IMPRESSION: 1. Interval removal of ET tube and feeding tube. 2. Unchanged consolidations of the right hemithorax and small right pleural effusion. Electronically Signed   By: Yetta Glassman M.D.   On: 09/26/2021 17:38   DG CHEST PORT 1 VIEW  Result Date: 09/25/2021 CLINICAL DATA:  Endotracheal tube  position. EXAM: PORTABLE CHEST 1 VIEW COMPARISON:  Sep 24, 2021. FINDINGS: The heart size and mediastinal contours are within normal limits. Endotracheal and feeding tubes are unchanged in position. Right-sided PICC line is unchanged. Left lung is clear. Stable right basilar atelectasis or infiltrate is noted with associated pleural effusion. The visualized skeletal structures are unremarkable. IMPRESSION: Stable support apparatus.  Stable right basilar opacity. Electronically Signed   By: Marijo Conception M.D.   On: 09/25/2021 08:14   DG CHEST PORT 1 VIEW  Result  Date: 09/24/2021 CLINICAL DATA:  Ventilator dependent respiratory failure. EXAM: PORTABLE CHEST 1 VIEW COMPARISON:  09/22/2021. FINDINGS: 4:39 a.m., 09/24/2021. ETT tip is 3.5 cm from the carina. Feeding tube enters the stomach with radiopaque tip at the level of the gastric body. Right PICC terminates in the distal SVC. The cardiac size is normal. The mediastinum is unchanged with mild aortic atherosclerosis. Persistent moderate right and small left pleural effusions. Patchy opacities of the right lung fields, greatest in the lower lung field showing mild improvement today. On the left no focal infiltrate is seen. IMPRESSION: Improving right pulmonary opacities with no change in the right-greater-than-left pleural effusions. Electronically Signed   By: Telford Nab M.D.   On: 09/24/2021 07:26   DG Chest Port 1 View  Result Date: 09/22/2021 CLINICAL DATA:  ETT placement EXAM: PORTABLE CHEST 1 VIEW COMPARISON:  09/20/2021 FINDINGS: Significantly rotated. Endotracheal tube is approximately 4 cm above the carina. Enteric tube passes into the stomach. Right central line tip overlies SVC. Persistent right pleural effusion. Some improvement in right basilar aeration. No pneumothorax. Similar cardiomediastinal contours. IMPRESSION: Persistent right pleural effusion. Right pulmonary opacities with some improvement in basilar aeration since 09/20/2021.  Electronically Signed   By: Macy Mis M.D.   On: 09/22/2021 17:22   DG CHEST PORT 1 VIEW  Result Date: 09/20/2021 CLINICAL DATA:  Status post intubation EXAM: PORTABLE CHEST 1 VIEW COMPARISON:  09/19/2021 FINDINGS: Cardiac shadow is stable. Endotracheal tube is noted in satisfactory position. Gastric catheter extends into the stomach although the proximal side port lies in the distal esophagus. This should be advanced several cm deeper into the stomach. Patchy airspace opacity is again identified throughout the right lung similar to that seen on prior exam and prior CT. Left lung remains clear. No bony abnormality is noted. IMPRESSION: Tubes and lines in satisfactory position. Persistent patchy infiltrate throughout the right lung. Electronically Signed   By: Inez Catalina M.D.   On: 09/20/2021 01:58   DG CHEST PORT 1 VIEW  Result Date: 09/19/2021 CLINICAL DATA:  Dyspnea. EXAM: PORTABLE CHEST 1 VIEW COMPARISON:  Radiograph and CT yesterday FINDINGS: Extensive multifocal right lung opacities with slight worsening aeration at the right lung base. This may be due to worsening airspace disease are increasing pleural effusion. The left lung airspace disease was better appreciated on yesterday's CT. Left pleural effusion is similar by radiograph. No pneumothorax. IMPRESSION: 1. Extensive multifocal right lung opacities with slight worsening aeration at the right lung base. This may be due to worsening airspace disease or increasing pleural effusion. 2. Left pleural effusion is similar. Left lung opacities on CT are not well demonstrated. Electronically Signed   By: Keith Rake M.D.   On: 09/19/2021 20:48   DG CHEST PORT 1 VIEW  Result Date: 09/18/2021 CLINICAL DATA:  Tachypnea EXAM: PORTABLE CHEST 1 VIEW COMPARISON:  Radiograph 09/15/2021 FINDINGS: There is increased density of confluent airspace disease in the right upper lung and increasing airspace opacities in the right lower lung. Small layering  right pleural effusion. No visible pneumothorax. IMPRESSION: Increased density of confluent airspace disease in the right upper lung and increasing opacity in the right lower lung other due to airspace disease or increased layering right pleural effusion. Electronically Signed   By: Maurine Simmering M.D.   On: 09/18/2021 08:40   DG CHEST PORT 1 VIEW  Result Date: 09/15/2021 CLINICAL DATA:  Shortness of breath. EXAM: PORTABLE CHEST 1 VIEW COMPARISON:  September 13, 2021 FINDINGS: The heart size  and mediastinal contours are within normal limits. Grossly stable right upper lobe pneumonia is noted. Mild left basilar atelectasis or infiltrate is noted as well. The visualized skeletal structures are unremarkable. IMPRESSION: Grossly stable right upper lobe pneumonia. Stable mild left basilar atelectasis or infiltrate. Electronically Signed   By: Marijo Conception M.D.   On: 09/15/2021 08:21   DG Abd Portable 1V  Result Date: 09/27/2021 CLINICAL DATA:  NG tube placement EXAM: PORTABLE ABDOMEN - 1 VIEW COMPARISON:  Sep 22, 2021 FINDINGS: The distal tip of the feeding tube is in the left upper quadrant in the region of the proximal stomach. Surgical staples are identified in this region. Opacity in the right base is stable. No other abnormalities or changes. IMPRESSION: 1. The distal tip of the feeding tube is located in the left upper quadrant, in the region the proximal stomach, unchanged. 2. Right basilar opacity is stable within visualized limits. Electronically Signed   By: Dorise Bullion III M.D.   On: 09/27/2021 10:05   DG Abd Portable 1V  Result Date: 09/18/2021 CLINICAL DATA:  Abdominal pain EXAM: PORTABLE ABDOMEN - 1 VIEW COMPARISON:  None. FINDINGS: No evidence of bowel obstruction. Right upper quadrant surgical clips. Moderate stool burden. Subacute-chronic left anterior ninth rib injury. Degenerative disc disease of the lumbar spine worst at L3-L4 and L4-L5. IMPRESSION: No evidence of bowel obstruction.  Electronically Signed   By: Maurine Simmering M.D.   On: 09/18/2021 08:45   DG Loyce Dys Tube Plc W/Fl W/Rad  Result Date: 09/28/2021 CLINICAL DATA:  Malnutrition.  Feeding tube advancement requested. EXAM: NASO G TUBE PLACEMENT WITH FL AND WITH RAD CONTRAST:  None. FLUOROSCOPY: Radiation Exposure Index (as provided by the fluoroscopic device): 13.2 mGy Kerma COMPARISON:  Abdominal x-ray from yesterday. FINDINGS: Preliminary x-ray demonstrates feeding tube tip in the proximal stomach. 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 Multiple attempts were made under fluoroscopic guidance to advanced the feeding tube, both with and without a stiff Amplatz guidewire. The tube could not be advanced any further and the procedure was ultimately stopped at the patient's request due primarily to discomfort in the nasopharynx. The tube was resecured to the patient's nose with final position of the tip unchanged compared to pre-procedure study. IMPRESSION: 1. Feeding tube terminates in the proximal stomach. Unable to advanced any further, likely related to altered anatomy from prior gastric bypass surgery. Electronically Signed   By: Titus Dubin M.D.   On: 09/28/2021 13:31   DG Swallowing Func-Speech Pathology  Result Date: 10/02/2021 Table formatting from the original result was not included. Objective Swallowing Evaluation: Type of Study: Bedside Swallow Evaluation  Patient Details Name: DEVANNY PALECEK MRN: [id number] Date of Birth: 1975/12/27 Today's Date: 10/02/2021 Time: SLP Start Time (ACUTE ONLY): 1011 -SLP Stop Time (ACUTE ONLY): 1019 SLP Time Calculation (min) (ACUTE ONLY): 8 min Past Medical History: Past Medical History: Diagnosis Date  Arthritis   Back and legs  Chronic back pain   DVT (deep venous thrombosis) (HCC)   early 20's leg  GIB (gastrointestinal bleeding) 09/13/2017  History of blood transfusion   Hypertension   Migraine headache   Mood  disorder ([place])   previously documented as bipolar and MDD  Neuropathy   Pneumonia   Positive PPD   x 2 last time 09/2017  Sciatica   Stress incontinence  Past Surgical History: Past Surgical History: Procedure Laterality Date  ALVEOLOPLASTY Bilateral 01/31/2018  Procedure:  ALVEOLOPLASTY;  Surgeon: Diona Browner, DDS;  Location: [facility];  Service: Oral Surgery;  Laterality: Bilateral;  AMPUTATION Left 09/16/2021  Procedure: AMPUTATION OF LEFT GREAT TOE;  Surgeon: Armond Hang, MD;  Location: [facility];  Service: Orthopedics;  Laterality: Left;  ANKLE SURGERY Left 1992  with hardware  BACK SURGERY    BIOPSY  02/08/2019  Procedure: BIOPSY;  Surgeon: Rush Landmark Telford Nab., MD;  Location: [facility];  Service: Gastroenterology;;  CESAREAN SECTION    CHOLECYSTECTOMY N/A 02/15/2017  Procedure: LAPAROSCOPIC CHOLECYSTECTOMY;  Surgeon: Clovis Riley, MD;  Location: [facility];  Service: General;  Laterality: N/A;  ENTEROSCOPY N/A 02/08/2019  Procedure: ENTEROSCOPY;  Surgeon: Rush Landmark Telford Nab., MD;  Location: [facility];  Service: Gastroenterology;  Laterality: N/A;  ESOPHAGOGASTRODUODENOSCOPY N/A 01/17/2021  Procedure: ESOPHAGOGASTRODUODENOSCOPY (EGD);  Surgeon: Juanita Craver, MD;  Location: [facility];  Service: Endoscopy;  Laterality: N/A;  ESOPHAGOGASTRODUODENOSCOPY N/A 09/03/2021  Procedure: ESOPHAGOGASTRODUODENOSCOPY (EGD);  Surgeon: Arta Silence, MD;  Location: [facility];  Service: Gastroenterology;  Laterality: N/A;  ESOPHAGOGASTRODUODENOSCOPY (EGD) WITH PROPOFOL N/A 09/15/2017  Procedure: ESOPHAGOGASTRODUODENOSCOPY (EGD) WITH PROPOFOL;  Surgeon: Clarene Essex, MD;  Location: [facility];  Service: Endoscopy;  Laterality: N/A;  ESOPHAGOGASTRODUODENOSCOPY (EGD) WITH PROPOFOL N/A 02/08/2019  Procedure: ESOPHAGOGASTRODUODENOSCOPY (EGD) WITH PROPOFOL;  Surgeon: Rush Landmark Telford Nab., MD;  Location: [facility];  Service: Gastroenterology;  Laterality: N/A;  GASTRIC BYPASS    INCISION AND DRAINAGE OF  PERITONSILLAR ABCESS Left 08/13/2017  Procedure: INCISION AND DRAINAGE OF LEFT NECK ABSCESS;  Surgeon: Melida Quitter, MD;  Location: [facility];  Service: ENT;  Laterality: Left;  LAPAROSCOPIC LYSIS OF ADHESIONS  02/15/2017  Procedure: LAPAROSCOPIC LYSIS OF ADHESIONS;  Surgeon: Clovis Riley, MD;  Location: [facility];  Service: General;;  LUMBAR DISC SURGERY    LUMBAR FUSION    SAVORY DILATION N/A 02/08/2019  Procedure: SAVORY DILATION;  Surgeon: Irving Copas., MD;  Location: [facility];  Service: Gastroenterology;  Laterality: N/A;  TOOTH EXTRACTION Bilateral 01/31/2018  Procedure: DENTAL RESTORATION/EXTRACTIONS;  Surgeon: Diona Browner, DDS;  Location: [facility];  Service: Oral Surgery;  Laterality: Bilateral;  TUBAL LIGATION    UPPER GI ENDOSCOPY N/A 02/15/2017  Procedure: UPPER GI ENDOSCOPY;  Surgeon: Clovis Riley, MD;  Location: [facility];  Service: General;  Laterality: N/A; HPI: Gastroenterology was consulted, EGD on 09/03/2021 showed esophageal plaques consistent with candidiasis, large cratered nonbleeding duodenal ulcer with no stigmata of bleeding.  bx 4/17 showed acute inflammation with necrosis compatible with abscess, granulomatous inflammation with eosinophilia and fragment of foreign material, negative for malignancy;  CXR Grossly stable right upper lobe pneumonia. Stable mild left basilar atelectasis or infiltrate., incr ox needs thus TEE deferred, 4/23  thora and s/p paracentesis 4/17; ? Endocarditis; cocaine +. Bedside swallow evaluation completed by SLP on 09/15/21 and patient's swallow at that time appeared largely [facility] and no f/u recommended. Patient had to be intubated on 4/30 secondary to tachypneic with accessory muscle use and increased WOB. She was extubated on 09/25/21 and has been NPO awaiting SLP swallow evaluation.  Subjective: alert, cooperative  Recommendations for follow up therapy are one component of a multi-disciplinary discharge planning process, led by the attending physician.   Recommendations may be updated based on patient status, additional functional criteria and insurance authorization. Assessment / Plan / Recommendation   09/30/2021  10:19 AM Clinical Impressions SLP Visit Diagnosis Dysphagia, pharyngoesophageal phase (R13.14) MBS completed with minimal amount of barium of thin, nectar, and cracker with pudding administered.  Moderate oral and functional pharyngo-cervical esophageal  swallow present.   Pharyngeal swallow is strong and timely without aspiration or penetration of any consistency tested. Mastication and oral transit prolonged with solids (lack of dentition likely exacerbates deficit).  Due to extent of oral deficits, recommend consider full liquid diet at this time with strict precautions.   SLP will follow clinically briefly to assure po tolerance and for dietary advancement indications. Ot note, pt cough prior to and during MBS without barium visualized in larynx/trachea.  Swallow Evaluation Recommendations        SLP Diet Recommendations: Thin liquid (full liquids)    Liquid Administration via: Cup;Straw    Medication Administration: Whole meds with liquid (as tolerated)    Supervision: Patient able to self feed    Compensations: Slow rate;Small sips/bites    Postural Changes: Remain semi-upright after after feeds/meals (Comment);Seated upright at 90 degrees    Oral Care Recommendations: Oral care BID       09/29/2021  12:55 PM Treatment Recommendations Treatment Recommendations Therapy as outlined in treatment plan below     09/29/2021  12:58 PM Prognosis Prognosis for Safe Diet Advancement Good Barriers to Reach Goals Cognitive deficits   09/30/2021  10:19 AM Diet Recommendations Compensations Slow rate;Small sips/bites     09/30/2021  10:19 AM Other Recommendations Follow Up Recommendations No SLP follow up Assistance recommended at discharge Frequent or constant Supervision/Assistance   09/29/2021  12:55 PM Frequency and Duration  Speech Therapy Frequency (ACUTE ONLY) min  2x/week Treatment Duration 1 week     09/29/2021  12:54 PM Oral Phase Oral Phase Impaired Oral - Nectar Teaspoon WFL Oral - Nectar Cup WFL Oral - Thin Teaspoon WFL Oral - Thin Cup WFL Oral - Thin Straw WFL Oral - Mech Soft Weak lingual manipulation;Impaired mastication;Lingual/palatal residue;Delayed oral transit    09/29/2021  12:54 PM Pharyngeal Phase Pharyngeal Phase [facility]    09/29/2021  12:54 PM Cervical Esophageal Phase  Cervical Esophageal Phase WFL Macario Golds 10/02/2021, 8:55 AM  [name], MS [place] Office [phone] Pager [phone]                    ECHOCARDIOGRAM COMPLETE  Result Date: 10/06/2021    ECHOCARDIOGRAM REPORT   Patient Name:   ARMINTA GAMM Date of Exam: 10/06/2021 Medical Rec #:  [id number]     Height:       69.0 in Accession #:    [id number]    Weight:       112.7 lb Date of Birth:  10/02/1975     BSA:          1.619 m Patient Age:    31 years      BP:           120/90 mmHg Patient Gender: F             HR:           87 bpm. Exam Location:  Inpatient Procedure: 2D Echo, Cardiac Doppler and Color Doppler Indications:    Endocarditis  History:        Patient has prior history of Echocardiogram examinations, most                 recent 09/09/2021. Risk Factors:Hypertension.  Sonographer:    Joette Catching RCS Referring Phys: [id number] Nira Conn E [place]  Sonographer Comments: Technically challenging study due to limited acoustic windows. Image acquisition challenging due to patient body habitus. IMPRESSIONS  1. Left ventricular ejection fraction, by estimation,  is 60 to 65%. The left ventricle has normal function. The left ventricle has no regional wall motion abnormalities. Left ventricular diastolic parameters are consistent with Grade I diastolic dysfunction (impaired relaxation).  2. Right ventricular systolic function is normal. The right ventricular size is normal.  3. The mitral valve is normal in structure. Trivial mitral valve regurgitation. No evidence of  mitral stenosis.  4. The aortic valve is normal in structure. There is mild calcification of the aortic valve. Aortic valve regurgitation is trivial. Aortic valve sclerosis/calcification is present, without any evidence of aortic stenosis.  5. The inferior vena cava is normal in size with greater than 50% respiratory variability, suggesting right atrial pressure of 3 mmHg. Comparison(s): No significant change from prior study. Prior images reviewed side by side. Conclusion(s)/Recommendation(s): No evidence of valvular vegetations on this transthoracic echocardiogram. Consider a transesophageal echocardiogram to exclude infective endocarditis if clinically indicated. FINDINGS  Left Ventricle: Left ventricular ejection fraction, by estimation, is 60 to 65%. The left ventricle has normal function. The left ventricle has no regional wall motion abnormalities. The left ventricular internal cavity size was normal in size. There is  no left ventricular hypertrophy. Left ventricular diastolic parameters are consistent with Grade I diastolic dysfunction (impaired relaxation). Normal left ventricular filling pressure. Right Ventricle: The right ventricular size is normal. No increase in right ventricular wall thickness. Right ventricular systolic function is normal. Left Atrium: Left atrial size was normal in size. Right Atrium: Right atrial size was normal in size. Pericardium: There is no evidence of pericardial effusion. Mitral Valve: The mitral valve is normal in structure. Trivial mitral valve regurgitation. No evidence of mitral valve stenosis. Tricuspid Valve: The tricuspid valve is normal in structure. Tricuspid valve regurgitation is trivial. No evidence of tricuspid stenosis. Aortic Valve: The aortic valve is normal in structure. There is mild calcification of the aortic valve. Aortic valve regurgitation is trivial. Aortic regurgitation PHT measures 637 msec. Aortic valve sclerosis/calcification is present, without  any evidence of aortic stenosis. Aortic valve mean gradient measures 4.0 mmHg. Aortic valve peak gradient measures 6.0 mmHg. Aortic valve area, by VTI measures 1.86 cm. Pulmonic Valve: The pulmonic valve was normal in structure. Pulmonic valve regurgitation is not visualized. No evidence of pulmonic stenosis. Aorta: The aortic root is normal in size and structure. Venous: The inferior vena cava is normal in size with greater than 50% respiratory variability, suggesting right atrial pressure of 3 mmHg. IAS/Shunts: No atrial level shunt detected by color flow Doppler.  LEFT VENTRICLE PLAX 2D LVIDd:         4.20 cm     Diastology LVIDs:         3.30 cm     LV e' medial:    8.27 cm/s LV PW:         0.80 cm     LV E/e' medial:  6.0 LV IVS:        0.80 cm     LV e' lateral:   9.90 cm/s LVOT diam:     2.00 cm     LV E/e' lateral: 5.0 LV SV:         44 LV SV Index:   27 LVOT Area:     3.14 cm  LV Volumes (MOD) LV vol d, MOD A2C: 65.0 ml LV vol d, MOD A4C: 76.0 ml LV vol s, MOD A2C: 34.9 ml LV vol s, MOD A4C: 36.9 ml LV SV MOD A2C:     30.1 ml LV SV  MOD A4C:     76.0 ml LV SV MOD BP:      35.2 ml RIGHT VENTRICLE RV Basal diam:  3.10 cm RV S prime:     10.20 cm/s TAPSE (M-mode): 1.3 cm LEFT ATRIUM           Index        RIGHT ATRIUM          Index LA diam:      3.40 cm 2.10 cm/m   RA Area:     9.74 cm LA Vol (A4C): 26.1 ml 16.12 ml/m  RA Volume:   18.80 ml 11.61 ml/m  AORTIC VALVE                    PULMONIC VALVE AV Area (Vmax):    2.02 cm     PV Vmax:       0.77 m/s AV Area (Vmean):   2.09 cm     PV Peak grad:  2.4 mmHg AV Area (VTI):     1.86 cm AV Vmax:           122.00 cm/s AV Vmean:          94.000 cm/s AV VTI:            0.237 m AV Peak Grad:      6.0 mmHg AV Mean Grad:      4.0 mmHg LVOT Vmax:         78.30 cm/s LVOT Vmean:        62.400 cm/s LVOT VTI:          0.140 m LVOT/AV VTI ratio: 0.59 AI PHT:            637 msec  AORTA Ao Root diam: 3.10 cm Ao Asc diam:  3.10 cm MITRAL VALVE               TRICUSPID  VALVE MV Area (PHT): 7.44 cm    TR Peak grad:   36.7 mmHg MV Decel Time: 102 msec    TR Vmax:        303.00 cm/s MV E velocity: 49.70 cm/s MV A velocity: 77.60 cm/s  SHUNTS MV E/A ratio:  0.64        Systemic VTI:  0.14 m                            Systemic Diam: 2.00 cm Dani Gobble Croitoru MD Electronically signed by Sanda Klein MD Signature Date/Time: 10/06/2021/4:02:15 PM    Final    [place] LT LOWER EXTREM LTD SOFT TISSUE NON VASCULAR  Result Date: 09/21/2021 CLINICAL DATA:  Evaluate for left groin abscess. EXAM: ULTRASOUND LEFT GROIN AREA LIMITED TECHNIQUE: Ultrasound examination of the lower extremity (left groin) soft tissues was performed in the area of clinical concern. COMPARISON:  None. FINDINGS: Joint Space: Not evaluated. Muscles: Not evaluated. Tendons: Not evaluated. Other Soft Tissue Structures: In the left groin region, no fluid collection or mass is seen. Limited assessment of the midline pelvis and left and right lower quadrants of the abdomen demonstrates moderate free ascites. Largest measured fluid pocket in the right lower quadrant is 8 cm in depth. IMPRESSION: 1. No left groin abscess or mass is seen. 2. Moderate free ascites, greatest depth of fluid right lower quadrant is 8 cm. Electronically Signed   By: Telford Nab M.D.   On: 09/21/2021 20:07   VAS [place] LOWER EXTREMITY VENOUS (DVT)  Result Date: 09/20/2021  Lower Venous DVT Study Patient Name:  DANETT PALAZZO  Date of Exam:   09/20/2021 Medical Rec #: [id number]      Accession #:    [id number] Date of Birth: 08-06-1975      Patient Gender: F Patient Age:   1 years Exam Location:  [facility] Procedure:      VAS [place] LOWER EXTREMITY VENOUS (DVT) Referring Phys: JESSICA MARSHALL --------------------------------------------------------------------------------  Indications: Swelling, and Edema.  Comparison Study: 09/13/21 prior Performing Technologist: Archie Patten RVS  Examination Guidelines: A complete evaluation includes B-mode  imaging, spectral Doppler, color Doppler, and power Doppler as needed of all accessible portions of each vessel. Bilateral testing is considered an integral part of a complete examination. Limited examinations for reoccurring indications may be performed as noted. The reflux portion of the exam is performed with the patient in reverse Trendelenburg.  +---------+---------------+---------+-----------+----------+--------------+ RIGHT    CompressibilityPhasicitySpontaneityPropertiesThrombus Aging +---------+---------------+---------+-----------+----------+--------------+ CFV      Full           Yes      Yes                                 +---------+---------------+---------+-----------+----------+--------------+ SFJ      Full                                                        +---------+---------------+---------+-----------+----------+--------------+ FV Prox  Full                                                        +---------+---------------+---------+-----------+----------+--------------+ FV Mid   Full                                                        +---------+---------------+---------+-----------+----------+--------------+ FV DistalFull                                                        +---------+---------------+---------+-----------+----------+--------------+ PFV      Full                                                        +---------+---------------+---------+-----------+----------+--------------+ POP      Full           Yes      Yes                                 +---------+---------------+---------+-----------+----------+--------------+ PTV      Full                                                        +---------+---------------+---------+-----------+----------+--------------+  PERO     Full                                                        +---------+---------------+---------+-----------+----------+--------------+    +---------+---------------+---------+-----------+----------+--------------+ LEFT     CompressibilityPhasicitySpontaneityPropertiesThrombus Aging +---------+---------------+---------+-----------+----------+--------------+ CFV      Full           Yes      Yes                                 +---------+---------------+---------+-----------+----------+--------------+ SFJ      Full                                                        +---------+---------------+---------+-----------+----------+--------------+ FV Prox  Full                                                        +---------+---------------+---------+-----------+----------+--------------+ FV Mid   Full                                                        +---------+---------------+---------+-----------+----------+--------------+ FV DistalFull                                                        +---------+---------------+---------+-----------+----------+--------------+ PFV      Full                                                        +---------+---------------+---------+-----------+----------+--------------+ POP      Full           Yes      Yes                                 +---------+---------------+---------+-----------+----------+--------------+ PTV      Full                                                        +---------+---------------+---------+-----------+----------+--------------+ PERO     Full                                                        +---------+---------------+---------+-----------+----------+--------------+  Summary: BILATERAL: - No evidence of deep vein thrombosis seen in the lower extremities, bilaterally. -No evidence of popliteal cyst, bilaterally.  LEFT: - Incidental finding: Large absess vs hematoma measuring 5.99cm x 4.21cm noted in the left groin. Etiology unknown. Further testing may be warranted.  *See table(s) above for measurements and  observations. Electronically signed by Orlie Pollen on 09/20/2021 at 3:15:42 PM.    Final    VAS [place] LOWER EXTREMITY VENOUS (DVT)  Result Date: 09/13/2021  Lower Venous DVT Study Patient Name:  KRISNA OMAR  Date of Exam:   09/13/2021 Medical Rec #: [id number]      Accession #:    [id number] Date of Birth: 31-Dec-1975      Patient Gender: F Patient Age:   72 years Exam Location:  [facility] Procedure:      VAS [place] LOWER EXTREMITY VENOUS (DVT) Referring Phys: A POWELL JR --------------------------------------------------------------------------------  Indications: Edema.  Limitations: Body habitus. Comparison Study: No prior study Performing Technologist: Maudry Mayhew MHA, RDMS, RVT, RDCS  Examination Guidelines: A complete evaluation includes B-mode imaging, spectral Doppler, color Doppler, and power Doppler as needed of all accessible portions of each vessel. Bilateral testing is considered an integral part of a complete examination. Limited examinations for reoccurring indications may be performed as noted. The reflux portion of the exam is performed with the patient in reverse Trendelenburg.  +-----+---------------+---------+-----------+----------+--------------+ RIGHTCompressibilityPhasicitySpontaneityPropertiesThrombus Aging +-----+---------------+---------+-----------+----------+--------------+ CFV  Full           Yes      Yes                                 +-----+---------------+---------+-----------+----------+--------------+   +---------+---------------+---------+-----------+----------+--------------+ LEFT     CompressibilityPhasicitySpontaneityPropertiesThrombus Aging +---------+---------------+---------+-----------+----------+--------------+ CFV      Full           Yes      Yes                                 +---------+---------------+---------+-----------+----------+--------------+ SFJ      Full                                                         +---------+---------------+---------+-----------+----------+--------------+ FV Prox  Full                                                        +---------+---------------+---------+-----------+----------+--------------+ FV Mid   Full                                                        +---------+---------------+---------+-----------+----------+--------------+ FV DistalFull                                                        +---------+---------------+---------+-----------+----------+--------------+  PFV      Full                                                        +---------+---------------+---------+-----------+----------+--------------+ POP      Full           Yes      Yes                                 +---------+---------------+---------+-----------+----------+--------------+ PTV      Full                                                        +---------+---------------+---------+-----------+----------+--------------+ PERO     Full                                                        +---------+---------------+---------+-----------+----------+--------------+    Summary: RIGHT: - No evidence of common femoral vein obstruction.  LEFT: - There is no evidence of deep vein thrombosis in the lower extremity.  - No cystic structure found in the popliteal fossa.  *See table(s) above for measurements and observations. Electronically signed by Jamelle Haring on 09/13/2021 at 8:21:32 PM.    Final    [place] EKG Site Rite  Result Date: 10/05/2021 If Site Rite image not attached, placement could not be confirmed due to current cardiac rhythm.  [place] EKG SITE RITE  Result Date: 09/20/2021 If Site Rite image not attached, placement could not be confirmed due to current cardiac rhythm.  US Abdomen Limited RUQ (LIVER/GB)  Result Date: 09/23/2021 CLINICAL DATA:  Ascites EXAM: ULTRASOUND ABDOMEN LIMITED RIGHT UPPER QUADRANT COMPARISON:  CT done on 09/18/2021 FINDINGS:  Gallbladder: Gallbladder is not seen consistent with cholecystectomy Common bile duct: Diameter: 3 mm Liver: Space-occupying lesions seen in the liver in the CT done on 09/18/2021 are not visualized in the submitted images of liver. Portal vein is patent on color Doppler imaging with normal direction of blood flow towards the liver. Other: There is evidence of large right pleural effusion. Small to moderate ascites is noted adjacent to the liver. According to the note by the technologist examination was technically difficult as patient was unable to follow instructions for breath hold. IMPRESSION: Status post cholecystectomy.  There is no dilation of bile ducts. Large right pleural effusion.  Small to moderate ascites. Electronically Signed   By: Elmer Picker M.D.   On: 09/23/2021 15:49   US THORACENTESIS ASP PLEURAL SPACE W/IMG GUIDE  Result Date: 09/15/2021 INDICATION: Patient with prior history of upper GI bleed, staph aureus infection, hepatic abscess, gangrene left great toe, portal vein thrombosis, ascites, polysubstance abuse, right pleural effusion. Request received for diagnostic and therapeutic right thoracentesis. EXAM: ULTRASOUND GUIDED DIAGNOSTIC AND THERAPEUTIC RIGHT THORACENTESIS MEDICATIONS: 10 mL 1% lidocaine COMPLICATIONS: None immediate. PROCEDURE: An ultrasound guided thoracentesis was thoroughly discussed with the patient and questions answered. The benefits, risks, alternatives and complications were also  discussed. The patient understands and wishes to proceed with the procedure. Written consent was obtained. Ultrasound was performed to localize and mark an adequate pocket of fluid in the right chest. The area was then prepped and draped in the normal sterile fashion. 1% Lidocaine was used for local anesthesia. Under ultrasound guidance a 6 Fr Safe-T-Centesis catheter was introduced. Thoracentesis was performed. The catheter was removed and a dressing applied. FINDINGS: A total of  approximately 1.2 liters of turbid, light yellow fluid was removed. Samples were sent to the laboratory as requested by the clinical team. IMPRESSION: Successful ultrasound guided diagnostic and therapeutic right thoracentesis yielding 1.2 liters of pleural fluid. Read by: Rowe Robert, PA-C Electronically Signed   By: Corrie Mckusick D.O.   On: 09/15/2021 08:03     The results of significant diagnostics from this hospitalization (including imaging, microbiology, ancillary and laboratory) are listed below for reference.     Microbiology: No results found for this or any previous visit (from the past 240 hour(s)).   Labs: BNP (last 3 results) Recent Labs    05/27/21 2355 08/19/21 2042  BNP 51.3 681.2*   Basic Metabolic Panel: Recent Labs  Lab 10/06/21 0434 10/07/21 0321 10/08/21 0244 10/09/21 0510 10/11/21 0514  NA 135 139 140 136 140  K 4.4 4.4 4.4 4.0 3.7  CL 106 110 111 110 113*  CO2 $Re'24 24 24 22 23  'tgZ$ GLUCOSE 88 87 81 78 69*  BUN 25* 30* 28* 27* 24*  CREATININE 0.41* 0.38* 0.47 0.48 0.48  CALCIUM 7.9* 8.2* 8.1* 7.9* 8.2*  MG 2.0 2.0  --   --   --   PHOS 4.7* 4.2  --   --   --    Liver Function Tests: Recent Labs  Lab 10/07/21 0321 10/08/21 0244 10/09/21 0510 10/11/21 0514  AST $Re'28 31 27 22  'sGw$ ALT 28 32 27 25  ALKPHOS 127* 137* 125 116  BILITOT 0.6 0.4 0.6 0.5  PROT 5.8* 6.1* 5.9* 6.1*  ALBUMIN 2.1* 2.2* 2.1* 2.1*   No results for input(s): LIPASE, AMYLASE in the last 168 hours. No results for input(s): AMMONIA in the last 168 hours. CBC: Recent Labs  Lab 10/07/21 0321 10/08/21 0244 10/09/21 0510 10/11/21 0514 10/12/21 0501  WBC 3.9* 6.9 4.6 4.6 4.9  NEUTROABS 2.3 5.4 2.8 2.9 3.1  HGB 7.8* 8.1* 8.1* 9.5* 10.4*  HCT 23.6* 24.9* 25.1* 28.7* 30.9*  MCV 79.7* 81.1 81.8 81.3 80.3  PLT 133* 153 144* 168 179   Cardiac Enzymes: No results for input(s): CKTOTAL, CKMB, CKMBINDEX, TROPONINI in the last 168 hours. BNP: Invalid input(s): POCBNP CBG: Recent Labs   Lab 10/07/21 1645  GLUCAP 86   D-Dimer No results for input(s): DDIMER in the last 72 hours. Hgb A1c No results for input(s): HGBA1C in the last 72 hours. Lipid Profile No results for input(s): CHOL, HDL, LDLCALC, TRIG, CHOLHDL, LDLDIRECT in the last 72 hours. Thyroid function studies No results for input(s): TSH, T4TOTAL, T3FREE, THYROIDAB in the last 72 hours.  Invalid input(s): FREET3 Anemia work up No results for input(s): VITAMINB12, FOLATE, FERRITIN, TIBC, IRON, RETICCTPCT in the last 72 hours. Urinalysis    Component Value Date/Time   COLORURINE YELLOW 09/15/2021 2145   APPEARANCEUR CLEAR 09/15/2021 2145   LABSPEC 1.016 09/15/2021 2145   PHURINE 6.0 09/15/2021 2145   GLUCOSEU NEGATIVE 09/15/2021 2145   HGBUR NEGATIVE 09/15/2021 2145   BILIRUBINUR NEGATIVE 09/15/2021 2145   [place] NEGATIVE 09/15/2021 2145   PROTEINUR NEGATIVE 09/15/2021  2145   UROBILINOGEN 1.0 02/09/2015 1228   NITRITE NEGATIVE 09/15/2021 2145   LEUKOCYTESUR SMALL (A) 09/15/2021 2145   Sepsis Labs Invalid input(s): PROCALCITONIN,  WBC,  LACTICIDVEN Microbiology No results found for this or any previous visit (from the past 240 hour(s)).   Time coordinating discharge:  I have spent 35 minutes face to face with the patient and on the ward discussing the patients care, assessment, plan and disposition with other care givers. >50% of the time was devoted counseling the patient about the risks and benefits of treatment/Discharge disposition and coordinating care.   SIGNED:   Damita Lack, MD  [facility] 10/12/2021, 11:50 AM   If 7PM-7AM, please contact night-coverage

## 2021-10-12 NOTE — Progress Notes (Signed)
Patient picked up to be transported to [name]. PICC line removed, rectal tube removed, patient tolerated well. Report called to facility, report given to [place], [place].

## 2021-10-12 NOTE — TOC Benefit Eligibility Note (Addendum)
Patient Teacher, English as a foreign language completed.    The patient is currently admitted and upon discharge could be taking linezolid (Zyvox) 600 mg tablets.  The current 7 day co-pay is, $4.00.   The patient is insured through [place], [place] Patient Advocate Specialist [place] Patient Advocate Team Direct Number: [phone]  Fax: [phone]

## 2021-10-20 ENCOUNTER — Other Ambulatory Visit (HOSPITAL_COMMUNITY): Payer: Self-pay | Admitting: Nurse Practitioner

## 2021-10-20 ENCOUNTER — Other Ambulatory Visit: Payer: Self-pay | Admitting: Nurse Practitioner

## 2021-10-20 DIAGNOSIS — K750 Abscess of liver: Secondary | ICD-10-CM

## 2021-10-22 ENCOUNTER — Ambulatory Visit: Payer: Medicaid Other | Admitting: Vascular Surgery

## 2021-10-23 IMAGING — DX DG CHEST 1V PORT
1 series · 2 of 2 positions shown · non-contrast
Comparison: 02/26/2021

CLINICAL DATA: Chest pain

EXAM:
PORTABLE CHEST 1 VIEW

[Series 1: chest ap · 0.14mm/px · 2 of 2 slices shown]
[im 1/2]
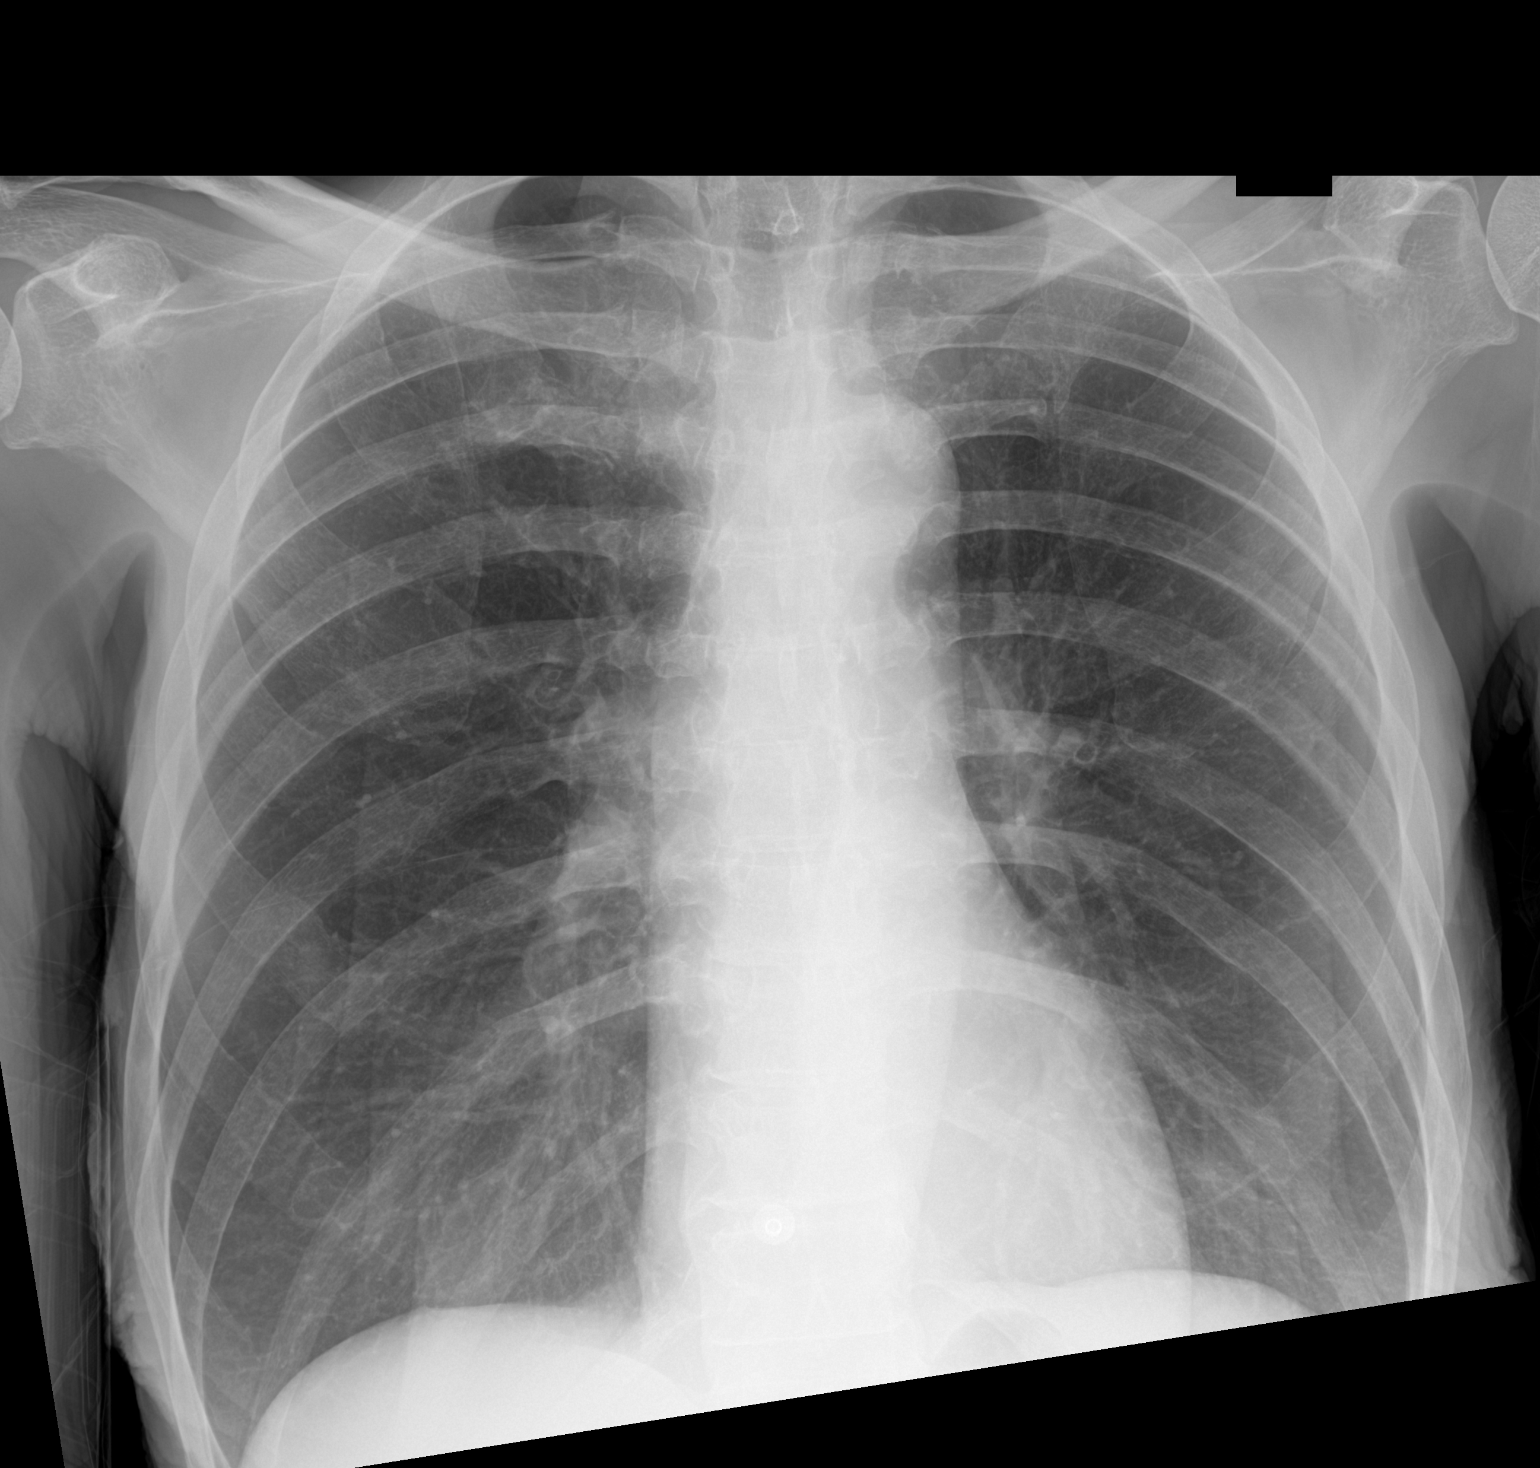
[im 2/2]
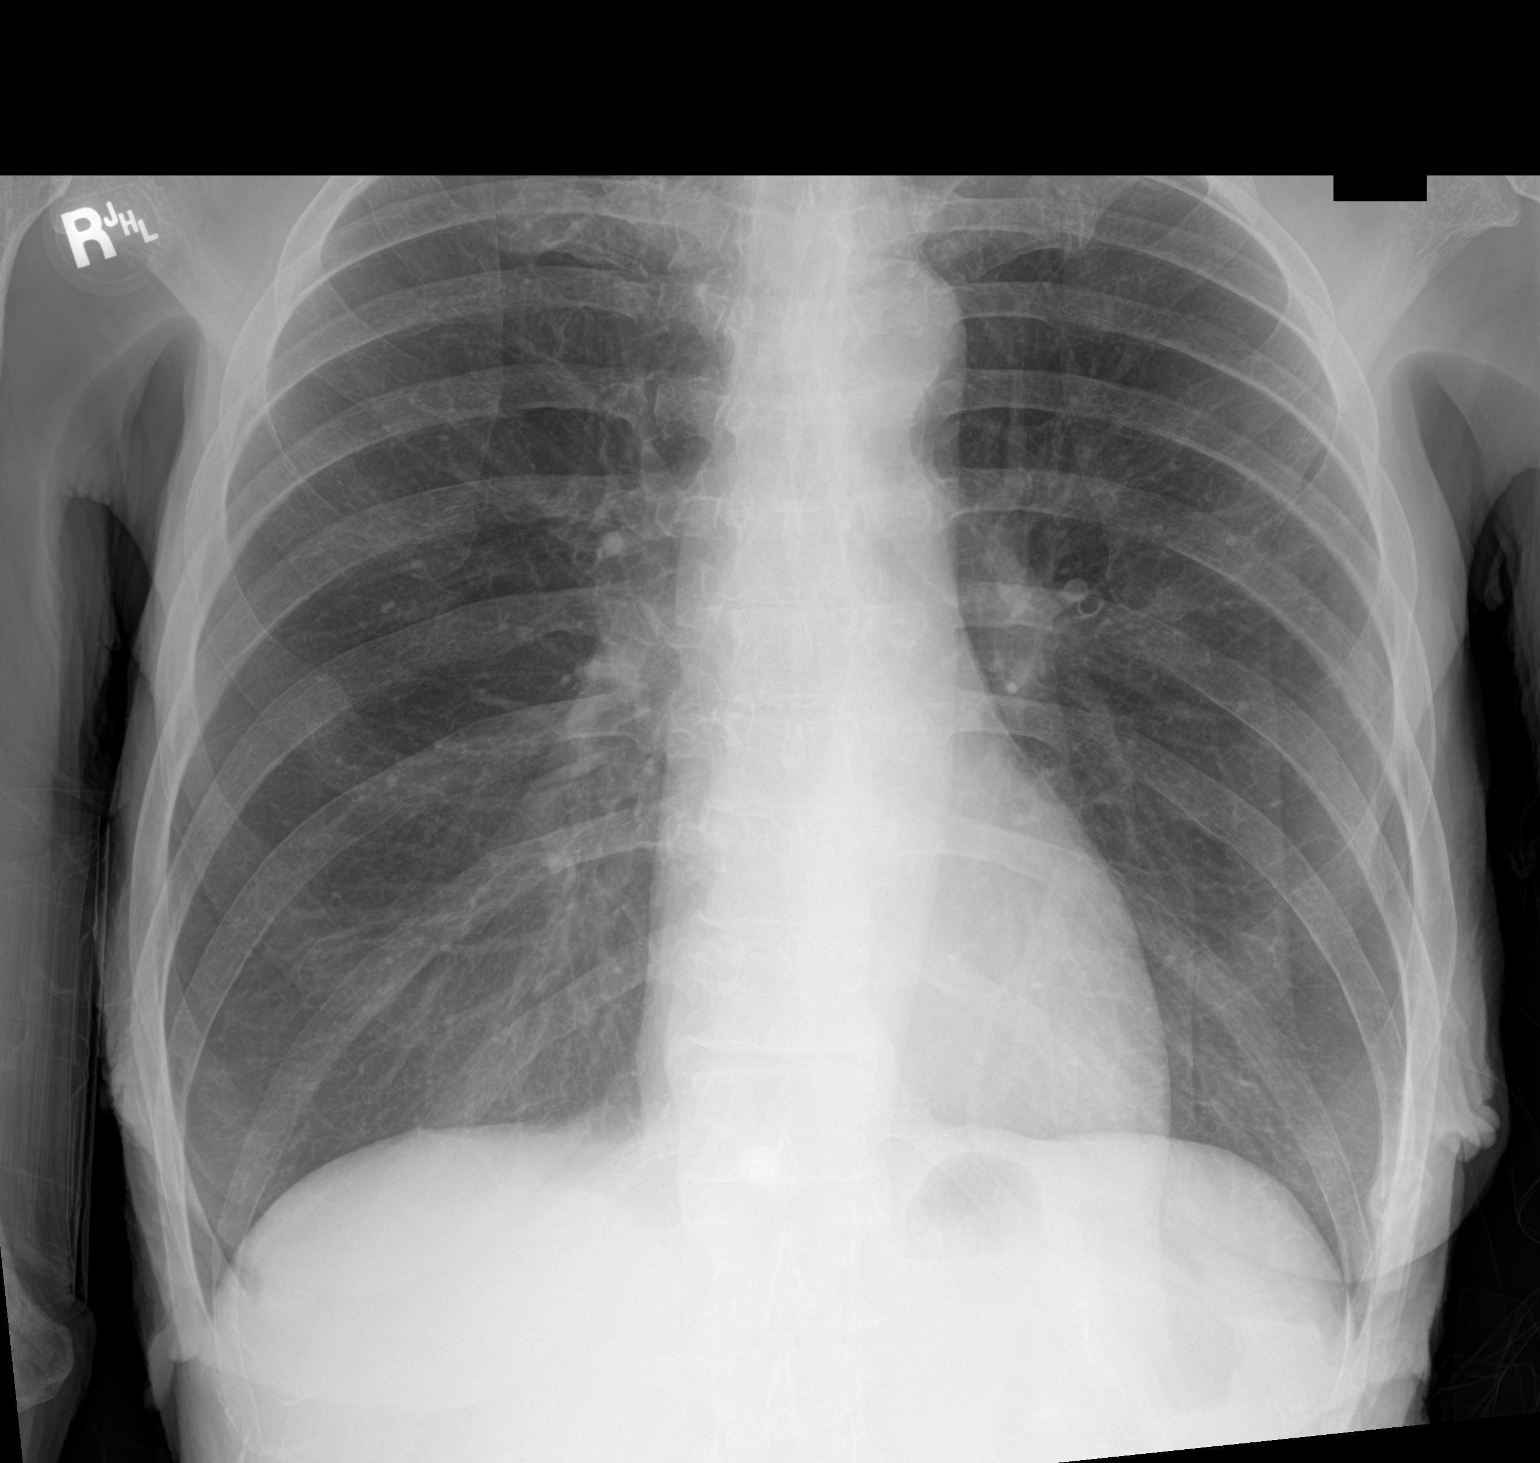

[2 of 2 positions shown; findings below may reference images not displayed]

FINDINGS: The heart size and mediastinal contours are within normal limits.
Both lungs are clear. The visualized skeletal structures are
unremarkable.
IMPRESSION: Negative.

## 2021-10-23 IMAGING — CT CT RENAL STONE PROTOCOL
2 of 4 series · 16 of 46 positions shown, 18 images · non-contrast
Comparison: 02/26/2021

CLINICAL DATA: Flank pain, kidney stone suspected left flank pain.

EXAM:
CT ABDOMEN AND PELVIS WITHOUT CONTRAST
TECHNIQUE: Multidetector CT imaging of the abdomen and pelvis was performed
following the standard protocol without IV contrast.

[Series 2: axial st · axial · 0.70mm/px · z∈[-458,-72]mm · 13 of 87 slices shown, 15 images]
[im 5/87  soft-tissue]
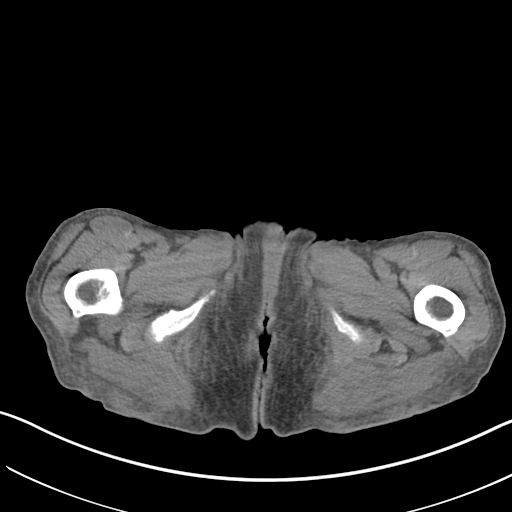
[im 5/87  bone]
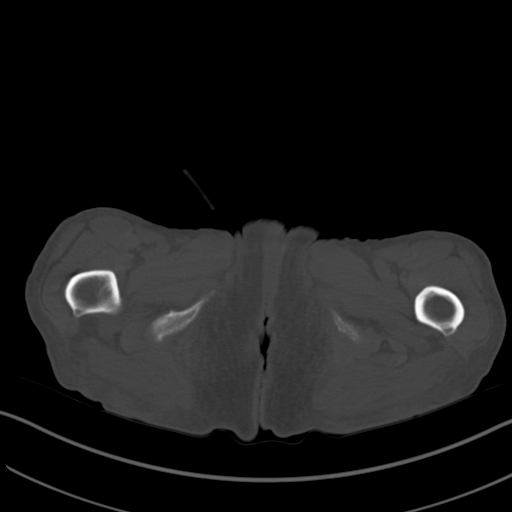
[im 10/87  soft-tissue]
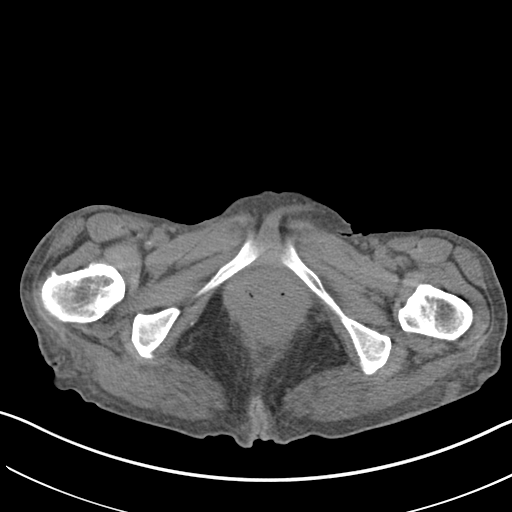
[im 20/87  soft-tissue]
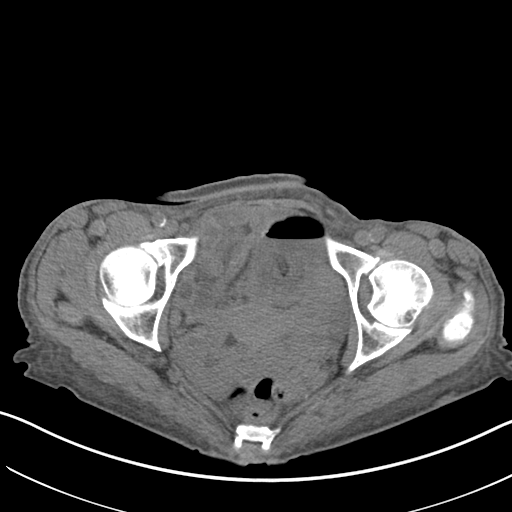
[im 24/87  soft-tissue]
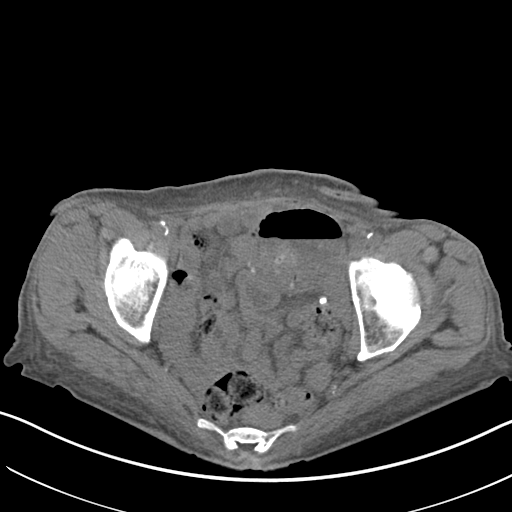
[im 29/87  soft-tissue]
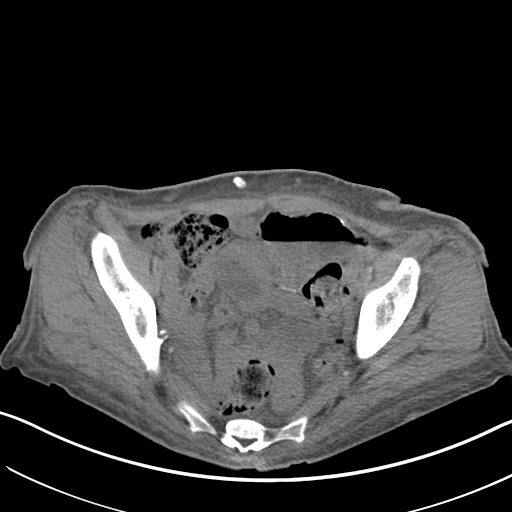
[im 39/87  soft-tissue]
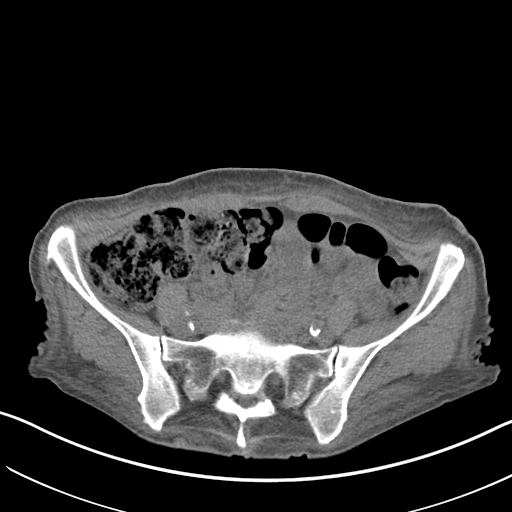
[im 44/87  soft-tissue]
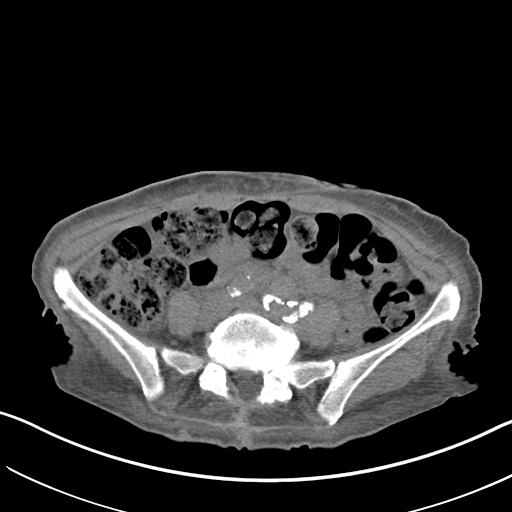
[im 48/87  soft-tissue]
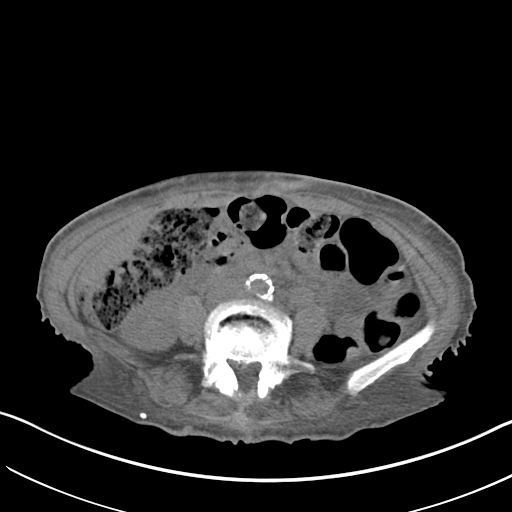
[im 58/87  soft-tissue]
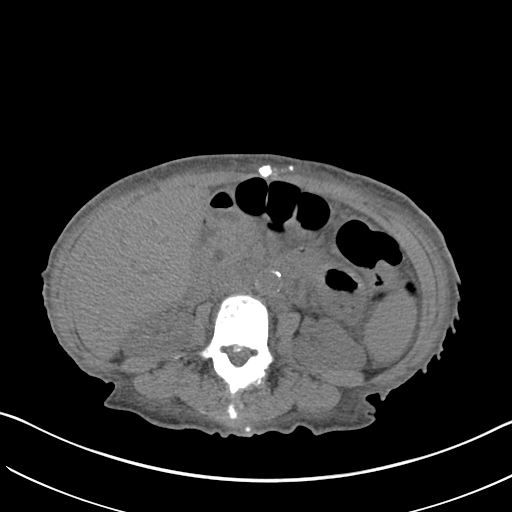
[im 58/87  bone]
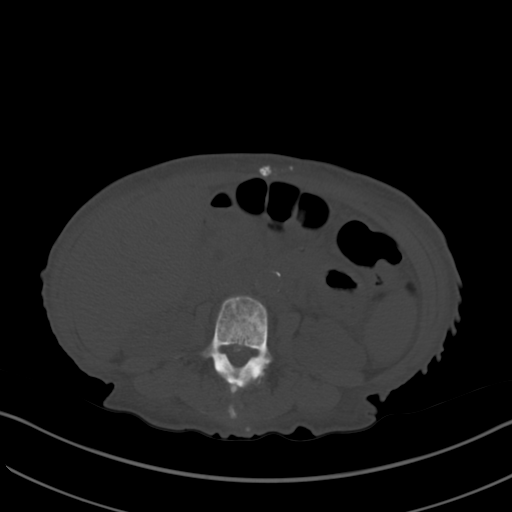
[im 63/87  soft-tissue]
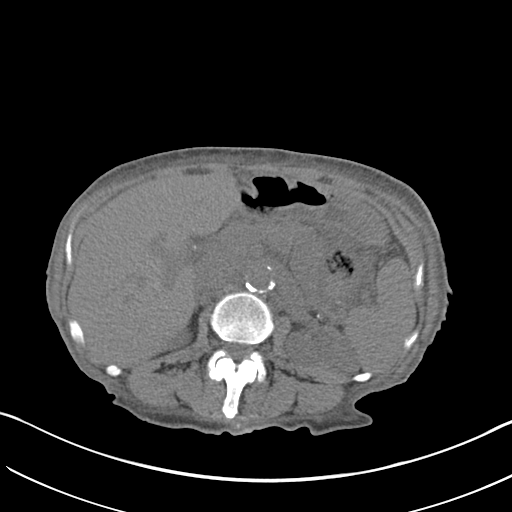
[im 67/87  soft-tissue]
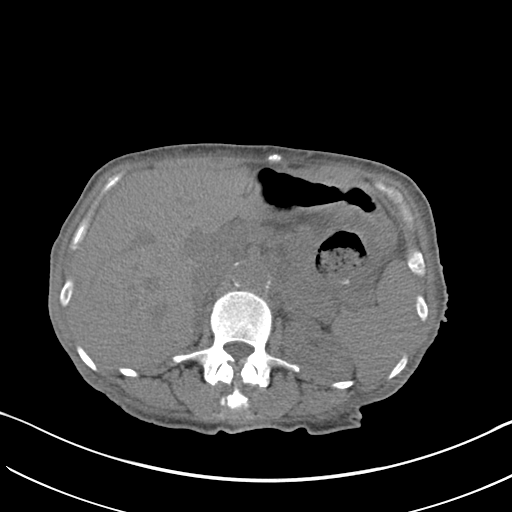
[im 77/87  soft-tissue]
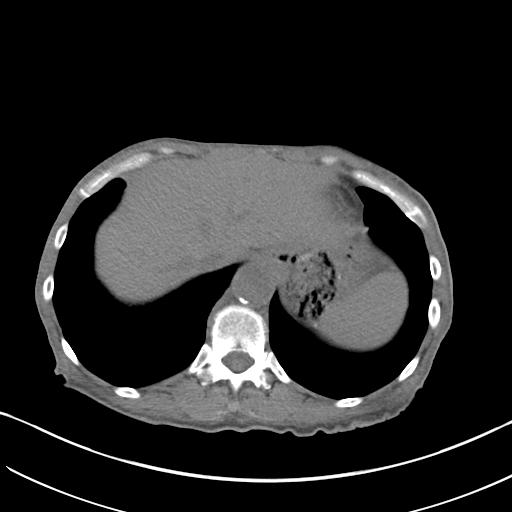
[im 82/87  soft-tissue]
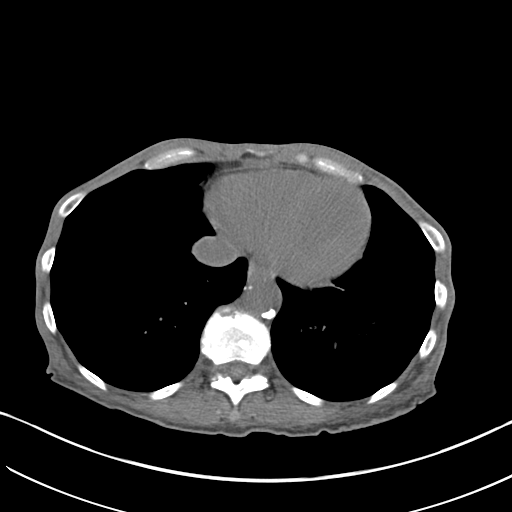

[Series 4: coronal · coronal · 0.73mm/px · 3 of 118 slices shown]
[im 40/118  soft-tissue]
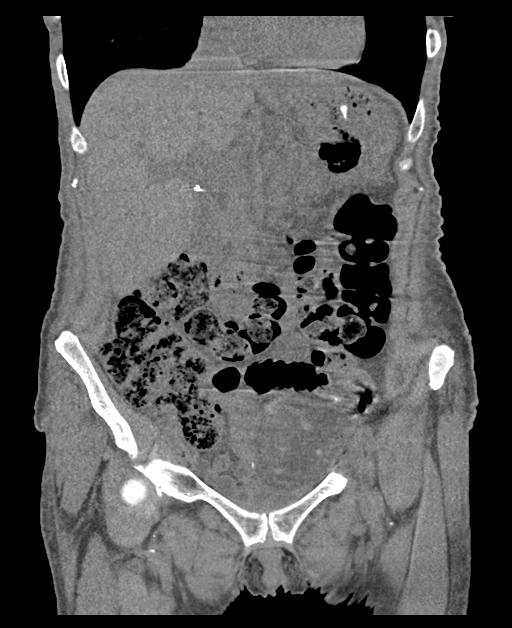
[im 53/118  soft-tissue]
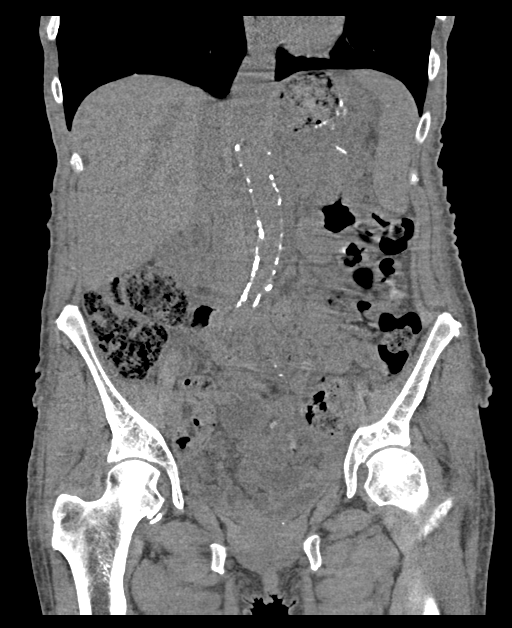
[im 66/118  soft-tissue]
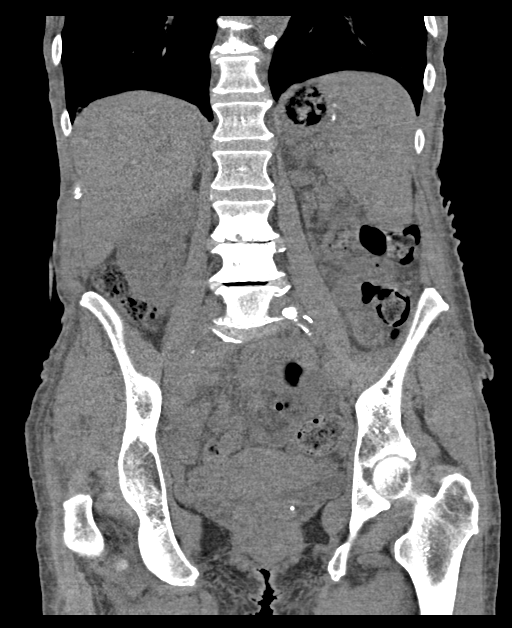

[16 of 46 positions shown; findings below may reference images not displayed]

FINDINGS: Lower chest: No acute abnormality.

Hepatobiliary: Prior cholecystectomy.  No focal hepatic abnormality.

Pancreas: No focal abnormality or ductal dilatation.

Spleen: No focal abnormality.  Normal size.

Adrenals/Urinary Tract: No adrenal abnormality. No focal renal
abnormality. No stones or hydronephrosis. Urinary bladder is
unremarkable.

Stomach/Bowel: Postoperative changes from prior gastric bypass.
Moderate stool throughout the colon. No evidence of bowel
obstruction.

Vascular/Lymphatic: Heavily calcified aorta and iliac vessels. No
evidence of aneurysm or adenopathy.

Reproductive: Uterus and adnexa unremarkable.  No mass.

Other: No free fluid or free air.

Musculoskeletal: No acute bony abnormality. Degenerative changes in
the lumbar spine.
IMPRESSION: No renal or ureteral stones.  No hydronephrosis.

Heavily calcified aorta and iliac vessels.

Moderate stool burden in the colon

No acute findings in the abdomen or pelvis.

## 2021-10-24 ENCOUNTER — Other Ambulatory Visit: Payer: Self-pay

## 2021-10-24 ENCOUNTER — Emergency Department (HOSPITAL_COMMUNITY)
Admission: EM | Admit: 2021-10-24 | Discharge: 2021-10-24 | Disposition: A | Discharge status: Skilled Nursing Facility | Payer: Medicaid Other | Attending: Emergency Medicine | Admitting: Emergency Medicine

## 2021-10-24 ENCOUNTER — Emergency Department (HOSPITAL_COMMUNITY): Payer: Medicaid Other

## 2021-10-24 ENCOUNTER — Encounter (HOSPITAL_COMMUNITY): Payer: Self-pay | Admitting: Emergency Medicine

## 2021-10-24 DIAGNOSIS — R1013 Epigastric pain: Secondary | ICD-10-CM | POA: Diagnosis not present

## 2021-10-24 DIAGNOSIS — E871 Hypo-osmolality and hyponatremia: Secondary | ICD-10-CM | POA: Diagnosis not present

## 2021-10-24 DIAGNOSIS — I10 Essential (primary) hypertension: Secondary | ICD-10-CM | POA: Diagnosis not present

## 2021-10-24 DIAGNOSIS — Z79899 Other long term (current) drug therapy: Secondary | ICD-10-CM | POA: Diagnosis not present

## 2021-10-24 DIAGNOSIS — R079 Chest pain, unspecified: Secondary | ICD-10-CM | POA: Insufficient documentation

## 2021-10-24 LAB — BASIC METABOLIC PANEL
Anion gap: 8 (ref 5–15)
BUN: 25 mg/dL — ABNORMAL HIGH (ref 6–20)
CO2: 23 mmol/L (ref 22–32)
Calcium: 8.2 mg/dL — ABNORMAL LOW (ref 8.9–10.3)
Chloride: 99 mmol/L (ref 98–111)
Creatinine, Ser: 0.35 mg/dL — ABNORMAL LOW (ref 0.44–1.00)
GFR, Estimated: >60 mL/min (ref >60)
Glucose, Bld: 96 mg/dL (ref 70–99)
Potassium: 3.9 mmol/L (ref 3.5–5.1)
Sodium: 130 mmol/L — ABNORMAL LOW (ref 135–145)

## 2021-10-24 LAB — TROPONIN I (HIGH SENSITIVITY): Troponin I (High Sensitivity): 7 ng/L (ref <18)

## 2021-10-24 MED ORDER — FAMOTIDINE IN NACL 20-0.9 MG/50ML-% IV SOLN: 20.0000 mg | Freq: Once | INTRAVENOUS | Status: DC

## 2021-10-24 MED ORDER — LIDOCAINE VISCOUS HCL 2 % MT SOLN
15.0000 mL | Freq: Once | OROMUCOSAL | Status: AC
  Administered 2021-10-24: 15 mL via ORAL
  Filled 2021-10-24: qty 15

## 2021-10-24 MED ORDER — MORPHINE SULFATE (PF) 4 MG/ML IV SOLN
4.0000 mg | Freq: Once | INTRAVENOUS | Status: AC
  Administered 2021-10-24: 4 mg via INTRAMUSCULAR
  Filled 2021-10-24: qty 1

## 2021-10-24 MED ORDER — HYDROCODONE-ACETAMINOPHEN 7.5-325 MG PO TABS: 1.0000 | ORAL_TABLET | Freq: Four times a day (QID) | ORAL | Status: DC | PRN

## 2021-10-24 MED ORDER — HYDROCODONE-ACETAMINOPHEN 7.5-325 MG PO TABS
1.0000 | ORAL_TABLET | Freq: Four times a day (QID) | ORAL | Status: DC | PRN
Start: 2021-10-24 — End: 2021-10-24

## 2021-10-24 MED ORDER — ALUM & MAG HYDROXIDE-SIMETH 200-200-20 MG/5ML PO SUSP
30.0000 mL | Freq: Once | ORAL | Status: AC
  Administered 2021-10-24: 30 mL via ORAL
  Filled 2021-10-24: qty 30

## 2021-10-24 MED ORDER — HYDROCODONE-ACETAMINOPHEN 5-325 MG PO TABS: 1.5000 | ORAL_TABLET | Freq: Four times a day (QID) | ORAL | Status: DC | PRN

## 2021-10-24 NOTE — ED Triage Notes (Signed)
Patient BIB [facility] from [facility]. Complaint of generalized chest pain that began this morning. Denied transport initially, called EMS again for ongoing chest pain. VSS.

## 2021-10-24 NOTE — ED Notes (Signed)
Pt only drank 1 sip of GI cocktail and could not tolerate taste

## 2021-10-24 NOTE — ED Notes (Signed)
Report called to [facility] [phone]

## 2021-10-24 NOTE — ED Notes (Signed)
[facility] arrives, voices concerns about transporting pt with elevated BP (160-180s SBP, 110-120s DBP despite ensuring appropriate cuff size and checking with a separate machine.). Shared concerns with Dr. Langston Masker; given patient's chronic pain and extensive medical history, plan of care would not change based on patient's current DC BP, no new interventions needed. Pt states that she would also prefer to be discharged at this time with [facility] rather than remain in ER for "my blood pressure which is always crazy. Let me go home."   DC instructions shared with patient. IV removed.

## 2021-10-24 NOTE — ED Provider Notes (Signed)
[facility] EMERGENCY DEPARTMENT Provider Note   CSN: [id number] Arrival date & time: 10/24/21  1505     History  Chief Complaint  Patient presents with   Chest Pain    Terri [name] is a 46 y.o. female w/ hx of chronic peptic ulcer, roux en y procedure, HTN, anemia, polysubstance use, presenting to the ED from care facility with chest pain.  She reports that she woke up this morning with sharp, stabbing epigastric and left-sided chest pain that does not radiate anywhere.  She denies that she has had this pain before, but does report that she suffers from significant reflux and heartburn.  She reports the pain has been constant.  She is asking for pain medication.  Medical history of the patient just been hospitalized 2 weeks ago and discharged, that time noted to have hypoxic and hypercapnic respiratory failure secondary to MRSA pneumonia, requiring BiPAP briefly, and weaned off of that.  There is also concern for disseminated MSSA with a liver abscess, foot abscess, and possible endocarditis, and she was transitioned to oral linezolid after infectious disease consultation at the time of discharge.  There were concerns about acute upper GI bleed as well, which resolved, and she was noted to have an EGD 1 month prior in April that showed esophageal plaques and candidiasis and a large cratered nonbleeding jejunal ulcer.  She has a hepatic abscess and is on antibiotics for that.  She also has severe protein calorie malnutrition, and osteomyelitis of left foot.  She was discharged to SNF.unfortunately she was deemed to be too unsafe for TEE to complete evaluation for endocarditis, although no vegetations were seen with poor visualization of the valves on echocardiogram.    She was also noted to have an issue of portal vein thrombosis, was not felt to be a good candidate for anticoagulation due to GI bleeding.  46 year old with recurrent GI bleed, chronic microcytic anemia, HTN,  polysubstance abuse, Roux-en-Y surgery admitted to the hospital initially with melanotic stool likely from chronic peptic ulcer.   HPI     Home Medications Prior to Admission medications   Medication Sig Start Date End Date Taking? Authorizing Provider  calcium carbonate (TUMS - DOSED IN MG ELEMENTAL CALCIUM) 500 MG chewable tablet Chew 1 tablet (200 mg of elemental calcium total) by mouth 3 (three) times daily. 10/12/21   Amin, Jeanella Flattery, MD  cloNIDine (CATAPRES - DOSED IN MG/24 HR) 0.1 mg/24hr patch Place 1 patch (0.1 mg total) onto the skin once a week. 10/16/21   Amin, Jeanella Flattery, MD  cyclobenzaprine (FLEXERIL) 5 MG tablet Take 1 tablet (5 mg total) by mouth 3 (three) times daily as needed for muscle spasms. 10/12/21   Amin, Jeanella Flattery, MD  dronabinol (MARINOL) 5 MG capsule Take 1 capsule (5 mg total) by mouth 2 (two) times daily before lunch and supper. 10/12/21   Amin, Jeanella Flattery, MD  folic acid (FOLVITE) 1 MG tablet Take 1 tablet (1 mg total) by mouth daily. 10/13/21   Amin, Jeanella Flattery, MD  HYDROcodone-acetaminophen (NORCO) 7.5-325 MG tablet Take 1 tablet by mouth every 6 (six) hours as needed for moderate pain or severe pain. 10/12/21   Amin, Jeanella Flattery, MD  lisinopril (ZESTRIL) 10 MG tablet Take 1 tablet (10 mg total) by mouth at bedtime. 10/12/21   Amin, Jeanella Flattery, MD  pantoprazole (PROTONIX) 40 MG tablet Take 1 tablet (40 mg total) by mouth daily before breakfast. 10/12/21   Damita Lack, MD  thiamine 100 MG tablet Take 1 tablet (100 mg total) by mouth daily. 10/13/21   Damita Lack, MD      Allergies    Nsaids and Ketorolac tromethamine    Review of Systems   Review of Systems  Physical Exam Updated Vital Signs BP 130/78 (BP Location: Left Arm)   Pulse (!) 58   Temp 98.7 F (37.1 C) (Oral)   Resp 17   LMP  (LMP Unknown)   SpO2 98%  Physical Exam Constitutional:      Comments: Thin, chronically malnourished and ill appearing  HENT:     Head:  Normocephalic and atraumatic.  Eyes:     Conjunctiva/sclera: Conjunctivae normal.     Pupils: Pupils are equal, round, and reactive to light.  Cardiovascular:     Rate and Rhythm: Normal rate and regular rhythm.  Pulmonary:     Effort: Pulmonary effort is normal. No respiratory distress.  Abdominal:     General: There is no distension.     Tenderness: There is no abdominal tenderness.  Skin:    General: Skin is warm and dry.  Neurological:     General: No focal deficit present.     Mental Status: She is alert. Mental status is at baseline.    ED Results / Procedures / Treatments   Labs (all labs ordered are listed, but only abnormal results are displayed) Labs Reviewed  BASIC METABOLIC PANEL - Abnormal; Notable for the following components:      Result Value   Sodium 130 (*)    BUN 25 (*)    Creatinine, Ser 0.35 (*)    Calcium 8.2 (*)    All other components within normal limits  CBC WITH DIFFERENTIAL/PLATELET  TROPONIN I (HIGH SENSITIVITY)  TROPONIN I (HIGH SENSITIVITY)    EKG None  Radiology DG Chest 2 View  Result Date: 10/24/2021 CLINICAL DATA:  Left-sided chest pain. EXAM: CHEST - 2 VIEW COMPARISON:  Multiple chest x-rays since Sep 24, 2021. CT scan of the chest September 18, 2021. FINDINGS: Mild opacity in the medial right upper lobe. Mild opacity in the right mid lung. A nodular density over the lateral left mid lung is consistent with a nipple shadow. No pneumothorax. The cardiomediastinal silhouette is stable. IMPRESSION: Opacity in the medial right upper lobe could represent atelectasis or infiltrate. Suspected subtle opacity in the right mid lung, concerning for pneumonia. Recommend short-term follow-up imaging to ensure resolution. No other acute abnormalities are identified. The cardiomediastinal silhouette is stable with a tortuous thoracic aorta. Electronically Signed   By: Dorise Bullion III M.D.   On: 10/24/2021 16:12    Procedures Procedures    Medications  Ordered in ED Medications  famotidine (PEPCID) IVPB 20 mg premix (20 mg Intravenous Patient Refused/Not Given 10/24/21 1803)  HYDROcodone-acetaminophen (NORCO) 7.5-325 MG per tablet 1 tablet (has no administration in time range)  alum & mag hydroxide-simeth (MAALOX/MYLANTA) 200-200-20 MG/5ML suspension 30 mL (30 mLs Oral Given 10/24/21 1534)    And  lidocaine (XYLOCAINE) 2 % viscous mouth solution 15 mL (15 mLs Oral Given 10/24/21 1534)  morphine (PF) 4 MG/ML injection 4 mg (4 mg Intramuscular Given 10/24/21 1535)    ED Course/ Medical Decision Making/ A&P Clinical Course as of 10/24/21 2114  Sat Oct 24, 2021  1729 Patient CBC unfortunately hemolyzed, patient refused repeat blood draw.  She is repeatedly asking for more narcotics.  I will give her the oral Norco that she is already prescribed, but explained will  not give her any further narcotics.  I have a low suspicion at this point for recurring pneumonia, there is a question of an infiltrate on chest x-ray but I suspect this is resolving prior admission, and she is currently on antibiotics.  Troponin is 7.  No evidence of an AKI or significant electrolyte derangement, aside from mild hyponatremia.  I think she is reasonably safe and stable for discharge at this point.  I do suspect that her pain is most likely related to esophagitis or gastritis with her GI history. [MT]    Clinical Course User Index [MT] Kelsay Haggard, Carola Rhine, MD                           Medical Decision Making Amount and/or Complexity of Data Reviewed Labs: ordered. Radiology: ordered. ECG/medicine tests: ordered.  Risk OTC drugs. Prescription drug management.   This patient presents to the ED with concern for chest pain. This involves an extensive number of treatment options, and is a complaint that carries with it a high risk of complications and morbidity.  The differential diagnosis includes recurring pneumonia versus gastritis versus peptic ulcer versus esophagitis  versus pulmonary embolism versus other  Co-morbidities that complicate the patient evaluation: History of bleeding jejunal ulcer, GI bleed, too high risk for anticoagulation, history of clotting.  History of pneumonia.  Additional history obtained from EMS on arrival  External records from outside source obtained and reviewed including hospital course and discharge summary from the past 2 months as noted above  I ordered and personally interpreted labs.  The pertinent results include: Troponin unremarkable, BNP near baseline  I ordered imaging studies including x-ray of the chest I independently visualized and interpreted imaging which showed questionable right-sided infiltrate, however I suspect this may be chronic or scarring, does not correlate with where she is having pain. I agree with the radiologist interpretation  The patient was maintained on a cardiac monitor.  I personally viewed and interpreted the cardiac monitored which showed an underlying rhythm of: Regular heart rate  Per my interpretation the patient's ECG shows regular heart rate  I ordered medication including intramuscular morphine as there was difficulty initially with IV access, as well as GI cocktail and IV Pepcid for pain and gastritis.  Patient does report significant distress on arrival.  Test Considered: Doubt acute pulmonary embolism clinically.  After the interventions noted above, I reevaluated the patient and found that they have: stayed the same  Appears difficult to control her pain.  Unfortunately she does have a documented history of polysubstance use, and has had difficulty with opioid dependence, per my review of the medical charts.  We will start her back on her oral Norco that she was prescribed at her facility.  She will need [facility] transportation arranged to get back to the SNF.  Dispostion:  After consideration of the diagnostic results and the patients response to treatment, I feel that the patent  would benefit from SNF provider follow up - repeat xray films in 1 week.         Final Clinical Impression(s) / ED Diagnoses Final diagnoses:  Chest pain, unspecified type    Rx / DC Orders ED Discharge Orders     None         Wyvonnia Dusky, MD 10/24/21 2115

## 2021-10-24 NOTE — Discharge Instructions (Addendum)
Terri [name]'s cardiac workup was normal in the ER today.  Her xray shows a possible lucency in the right lung which may be a resolving pneumonia - but recommended repeat xrays in 1 week. She should be re-evaluated by the facility provider upon her return and monitored this week for symptoms of pneumonia.  Her pain may also be related to her esophagitis and jejunal ulcer.  Consider management of gastritis/esophagitis.

## 2021-10-26 IMAGING — CT CT ABD-PELV W/ CM
2 of 5 series · 15 of 46 positions shown, 17 images · IV contrast (OMNIPAQUE 350)
Comparison: CT abdomen pelvis 02/26/2021

CLINICAL DATA: Nonlocalized acute abdominal pain. Abdomen pain
lower cramping Pelvic inflammatory disease and STD per patient.

EXAM:
CT ABDOMEN AND PELVIS WITH CONTRAST
TECHNIQUE: Multidetector CT imaging of the abdomen and pelvis was performed
using the standard protocol following bolus administration of
intravenous contrast.
CONTRAST:  80mL OMNIPAQUE IOHEXOL 350 MG/ML SOLN

[Series 2: axial st · axial · 0.74mm/px · z∈[-570,-180]mm · 12 of 94 slices shown, 14 images]
[im 8/94  soft-tissue]
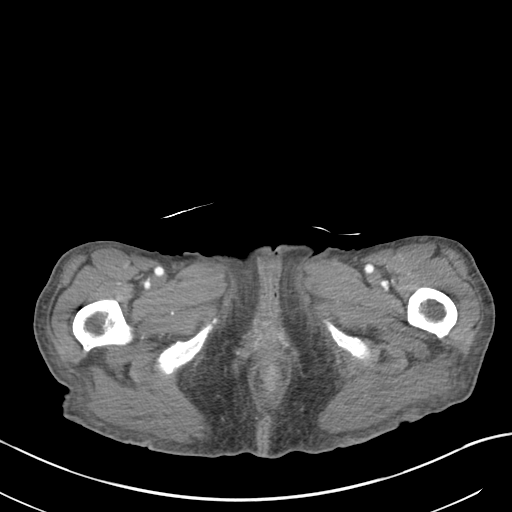
[im 8/94  bone]
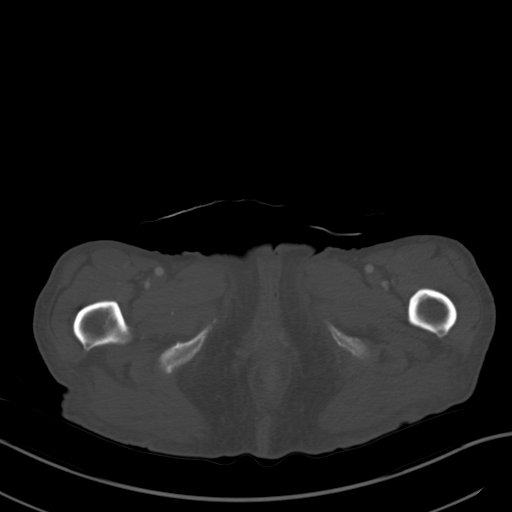
[im 15/94  soft-tissue]
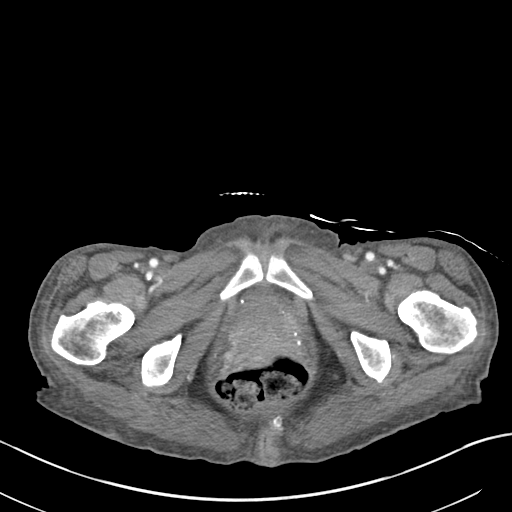
[im 22/94  soft-tissue]
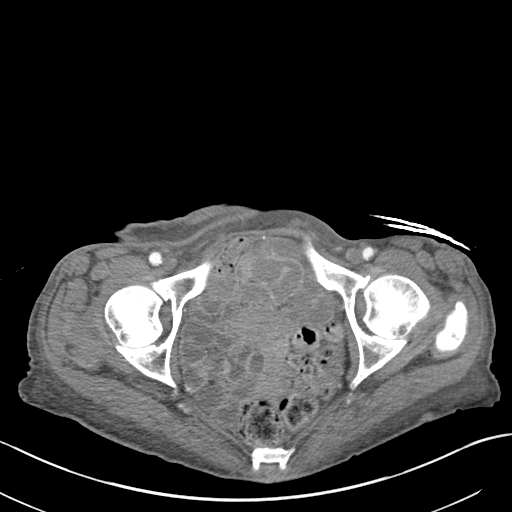
[im 29/94  soft-tissue]
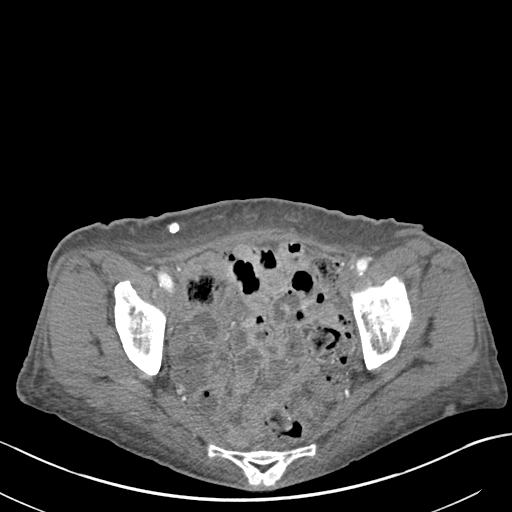
[im 36/94  soft-tissue]
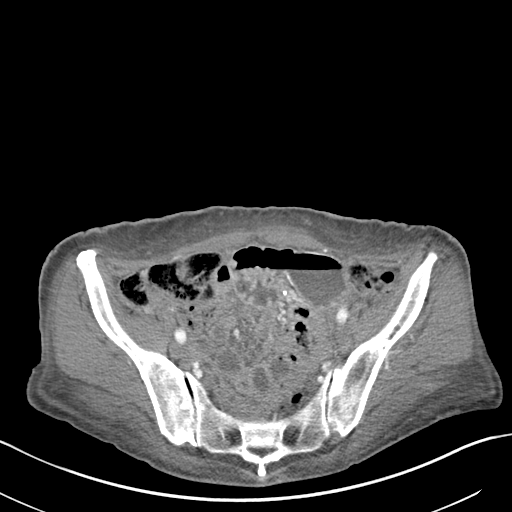
[im 43/94  soft-tissue]
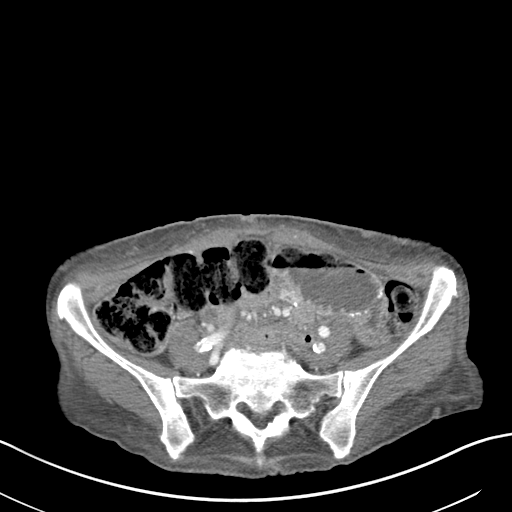
[im 51/94  soft-tissue]
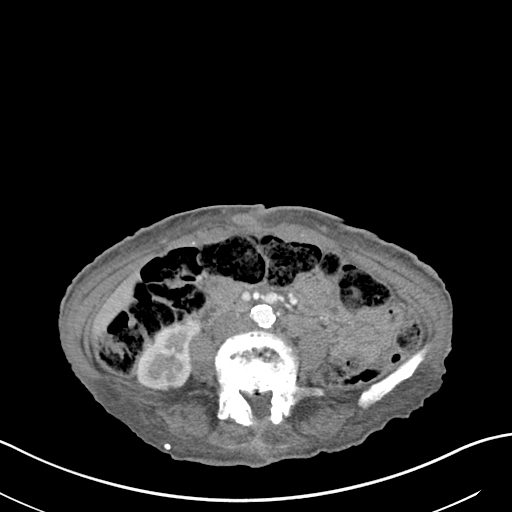
[im 58/94  soft-tissue]
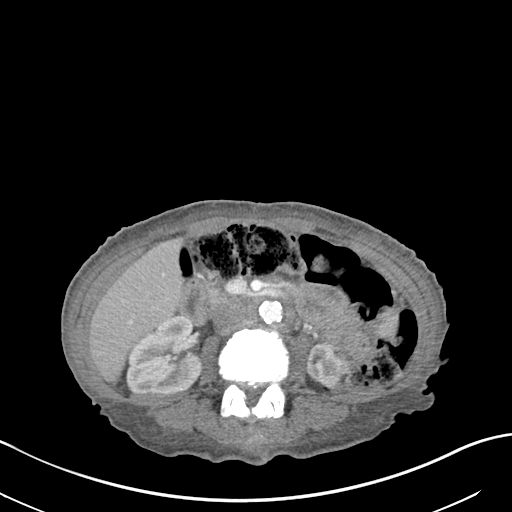
[im 65/94  soft-tissue]
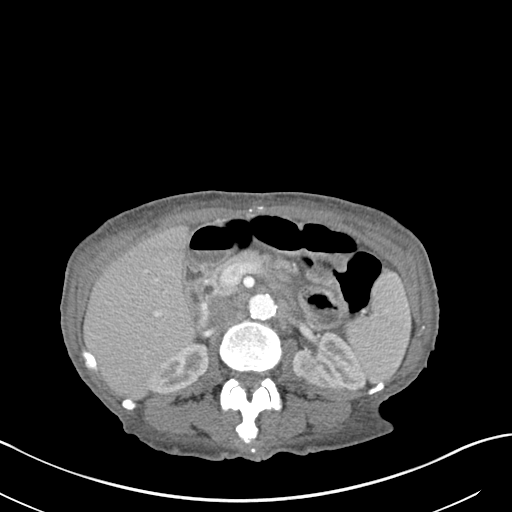
[im 65/94  bone]
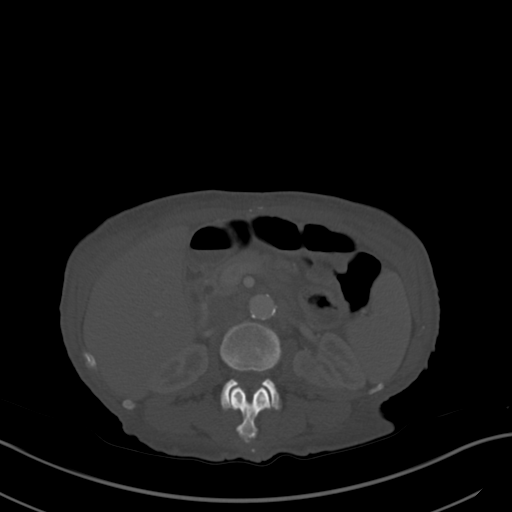
[im 72/94  soft-tissue]
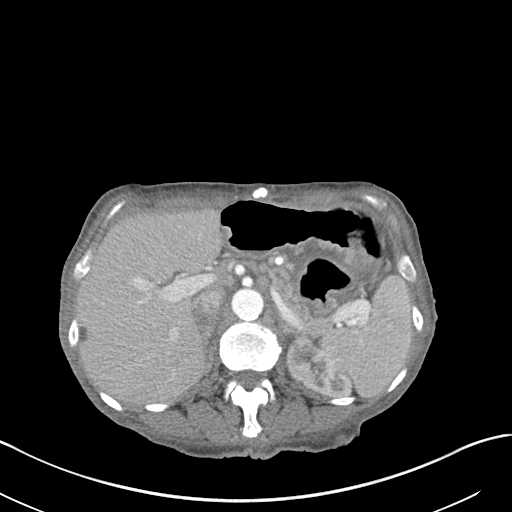
[im 79/94  soft-tissue]
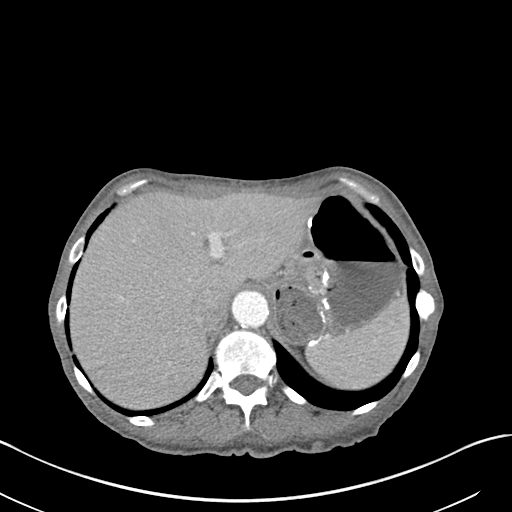
[im 86/94  soft-tissue]
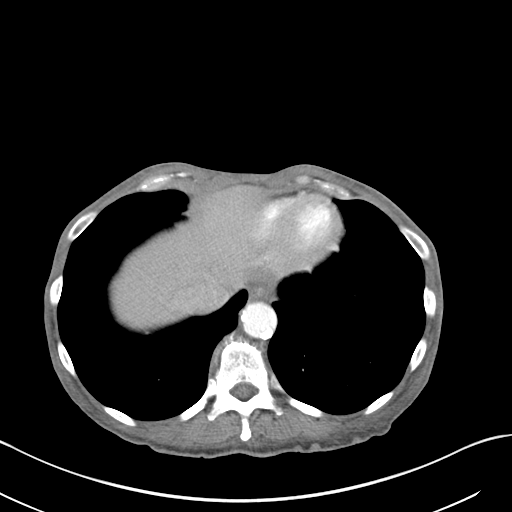

[Series 5: coronal st · coronal · 0.89mm/px · 3 of 118 slices shown]
[im 40/118  soft-tissue]
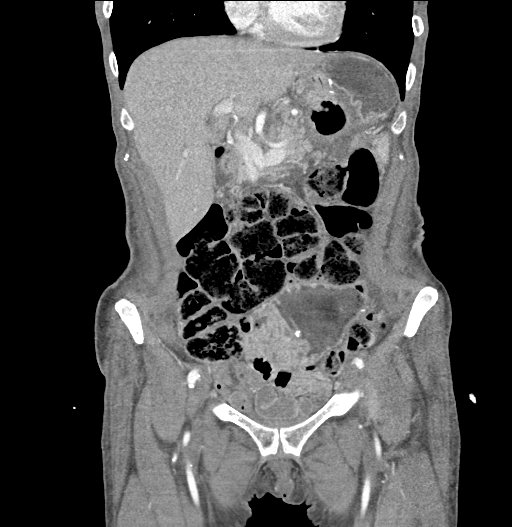
[im 53/118  soft-tissue]
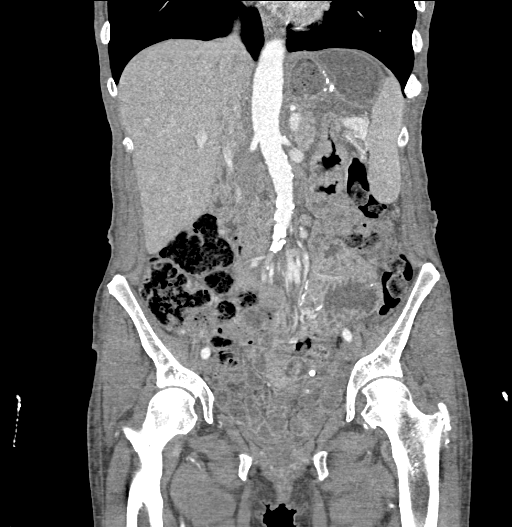
[im 66/118  soft-tissue]
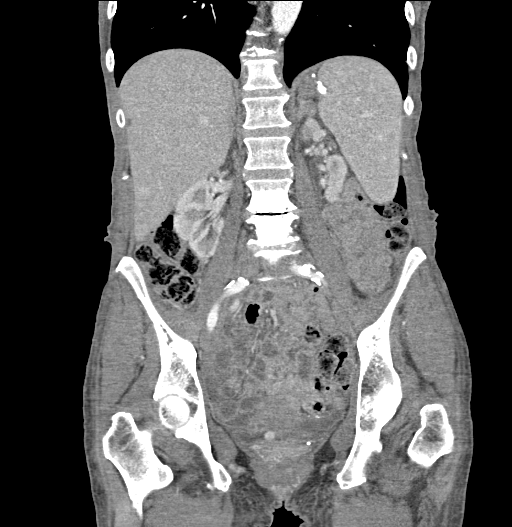

[15 of 46 positions shown; findings below may reference images not displayed]

FINDINGS: Lower chest: Bibasilar linear atelectasis versus scarring.

Hepatobiliary: Indeterminate oval 1.1 cm right hepatic lobe
hypodensity ([DATE]). Status post cholecystectomy. No biliary
dilatation.

Pancreas: No focal lesion. Normal pancreatic contour. No surrounding
inflammatory changes. No main pancreatic ductal dilatation.

Spleen: Normal in size without focal abnormality.

Adrenals/Urinary Tract:

No adrenal nodule bilaterally.

Bilateral kidneys enhance symmetrically.

No hydronephrosis. No hydroureter.

The urinary bladder is unremarkable.

On delayed imaging, there is no urothelial wall thickening and there
are no filling defects in the opacified portions of the bilateral
collecting systems or ureters.

Stomach/Bowel: Gastric Roux-en-Y surgical bypass surgical changes.
Stomach is within normal limits. No evidence of bowel wall
thickening or dilatation. Stool throughout the colon. The appendix
not definitely identified.

Vascular/Lymphatic: No abdominal aorta or iliac aneurysm. Severe
atherosclerotic plaque of the aorta and its branches. No abdominal,
pelvic, or inguinal lymphadenopathy.

Reproductive: Markedly limited evaluation of the pelvis due to lack
of intraperitoneal fat. Uterus and bilateral adnexa are
unremarkable.

Other: No intraperitoneal free fluid. No intraperitoneal free gas.
No organized fluid collection.

Musculoskeletal:

Several tiny foci of gas along the subcutaneus soft tissues of the
bilateral labia majora.

No suspicious lytic or blastic osseous lesions. No acute displaced
fracture. Multilevel degenerative changes of the spine.
IMPRESSION: 1. Several tiny foci of gas along the subcutaneus soft tissues of
the bilateral labia majora. Gas could be within folds external to
the soft tissues but this is unlikely given the distribution on
coronal imaging. No organized fluid collection. Necrotizing
fasciitis cannot be excluded as this is a clinical diagnosis.
Recommend correlation with physical exam.
2. No acute intra-abdominal or intrapelvic abnormality with slightly
limited evaluation due to lack of intraperitoneal fat. Given
history, please consider pelvic ultrasound if clinically indicated.
3. Stool throughout the colon.

## 2021-10-27 ENCOUNTER — Ambulatory Visit (HOSPITAL_COMMUNITY): Payer: Medicaid Other

## 2021-10-27 ENCOUNTER — Other Ambulatory Visit: Payer: Self-pay

## 2021-10-27 ENCOUNTER — Ambulatory Visit (INDEPENDENT_AMBULATORY_CARE_PROVIDER_SITE_OTHER): Payer: Medicaid Other | Admitting: Internal Medicine

## 2021-10-27 ENCOUNTER — Encounter: Payer: Self-pay | Admitting: Internal Medicine

## 2021-10-27 VITALS — HR 96 | Temp 97.2°F | Resp 16

## 2021-10-27 DIAGNOSIS — A4901 Methicillin susceptible Staphylococcus aureus infection, unspecified site: Secondary | ICD-10-CM

## 2021-10-27 NOTE — Patient Instructions (Addendum)
Patient has completed antibiotic therapy for her disseminated Staph infection and no further antibiotics are indicated.  She can follow up with infectious disease as needed.  She continues to complain of back pain.  MRI imaging during admission was negative for infection.  Would consider further pain control as needed.

## 2021-10-27 NOTE — Progress Notes (Signed)
[facility] for Infectious Disease  CHIEF COMPLAINT:    Follow up for disseminated Staph infection  SUBJECTIVE:    Terri [name] is a 46 y.o. female with PMHx as below who presents to the clinic for hospital follow up regarding disseminated Staph infection.  She had a very prolonged admission at [facility] from 4/10-5/22.   During that admission she had many infectious issues that were treated including:  Staph aureus pneumonia: Patient completed 10 days of MRSA coverage with vancomycin --> ceftaroline (changed due to issues with supratherapeutic Vanco levels) Hepatic abscess: Status post aspiration 4/17 and cultures that grew MSSA and Bacteroides Osteomyelitis of the left great toe: Status post amputation with or cultures that grew MRSA Candida esophagitis: Completed therapy  She underwent TTE on 5/16 as TEE was felt to be prohibitive that was a poor study but did not have any evidence of vegetation.  She was treated with 6 weeks of antibiotics from the date of her liver abscess aspiration from 4/17-5/29.  She was discharged to a SNF to continue rehab and finish antibiotics.  She presents today for follow up.  Her main complaint today is back pain.  She is asking for pain medication.  She had MRI of her spine during admission that was negative for infection.  Please see A&P for the details of today's visit and status of the patient's medical problems.   Patient's Medications  New Prescriptions   No medications on file  Previous Medications   CALCIUM CARBONATE (TUMS - DOSED IN MG ELEMENTAL CALCIUM) 500 MG CHEWABLE TABLET    Chew 1 tablet (200 mg of elemental calcium total) by mouth 3 (three) times daily.   CLONIDINE (CATAPRES - DOSED IN MG/24 HR) 0.1 MG/24HR PATCH    Place 1 patch (0.1 mg total) onto the skin once a week.   CYCLOBENZAPRINE (FLEXERIL) 5 MG TABLET    Take 1 tablet (5 mg total) by mouth 3 (three) times daily as needed for muscle spasms.   DRONABINOL  (MARINOL) 5 MG CAPSULE    Take 1 capsule (5 mg total) by mouth 2 (two) times daily before lunch and supper.   FOLIC ACID (FOLVITE) 1 MG TABLET    Take 1 tablet (1 mg total) by mouth daily.   HYDROCODONE-ACETAMINOPHEN (NORCO) 7.5-325 MG TABLET    Take 1 tablet by mouth every 6 (six) hours as needed for moderate pain or severe pain.   LISINOPRIL (ZESTRIL) 10 MG TABLET    Take 1 tablet (10 mg total) by mouth at bedtime.   PANTOPRAZOLE (PROTONIX) 40 MG TABLET    Take 1 tablet (40 mg total) by mouth daily before breakfast.   THIAMINE 100 MG TABLET    Take 1 tablet (100 mg total) by mouth daily.  Modified Medications   No medications on file  Discontinued Medications   No medications on file      Past Medical History:  Diagnosis Date   Arthritis    Back and legs   Chronic back pain    DVT (deep venous thrombosis) (HCC)    early 20's leg   GIB (gastrointestinal bleeding) 09/13/2017   History of blood transfusion    Hypertension    Migraine headache    Mood disorder ([place])    previously documented as bipolar and MDD   Neuropathy    Pneumonia    Positive PPD    x 2 last time 09/2017   Sciatica  Stress incontinence     Social History   Tobacco Use   Smoking status: Every Day    Packs/day: 1.00    Years: 29.00    Pack years: 29.00    Types: Cigarettes   Smokeless tobacco: Never  Vaping Use   Vaping Use: Never used  Substance Use Topics   Alcohol use: No   Drug use: Yes    Types: Cocaine, Heroin    Comment: Patient states she smokes cocaine    Family History  Problem Relation Age of Onset   Diabetes Mother    Hypertension Mother    Diabetes Father    Hypertension Father     Allergies  Allergen Reactions   Nsaids Other (See Comments)    Stomach ulcer    Ketorolac Tromethamine Itching and Nausea And Vomiting    Review of Systems  Constitutional: Negative.   Respiratory: Negative.    Cardiovascular: Negative.   Musculoskeletal:  Positive for back pain.     OBJECTIVE:    Vitals:   10/27/21 1510  Pulse: 96  Resp: 16  Temp: (!) 97.2 F (36.2 C)  SpO2: 99%   There is no height or weight on file to calculate BMI.  Physical Exam Constitutional:      Comments: Chronically ill appearing, sitting in chair, NAD.   Pulmonary:     Effort: Pulmonary effort is normal. No respiratory distress.  Musculoskeletal:        General: Normal range of motion.     Cervical back: Normal range of motion and neck supple.  Skin:    General: Skin is warm and dry.  Neurological:     General: No focal deficit present.     Mental Status: She is oriented to person, place, and time.  Psychiatric:        Mood and Affect: Mood normal.        Behavior: Behavior normal.     Labs and Microbiology:    Latest Ref Rng & Units 10/12/2021    5:01 AM 10/11/2021    5:14 AM 10/09/2021    5:10 AM  CBC  WBC 4.0 - 10.5 K/uL 4.9   4.6   4.6    Hemoglobin 12.0 - 15.0 g/dL 10.4   9.5   8.1    Hematocrit 36.0 - 46.0 % 30.9   28.7   25.1    Platelets 150 - 400 K/uL 179   168   144        Latest Ref Rng & Units 10/24/2021    3:26 PM 10/11/2021    5:14 AM 10/09/2021    5:10 AM  CMP  Glucose 70 - 99 mg/dL 96   69   78    BUN 6 - 20 mg/dL '25   24   27    '$ Creatinine 0.44 - 1.00 mg/dL 0.35   0.48   0.48    Sodium 135 - 145 mmol/L 130   140   136    Potassium 3.5 - 5.1 mmol/L 3.9   3.7   4.0    Chloride 98 - 111 mmol/L 99   113   110    CO2 22 - 32 mmol/L '23   23   22    '$ Calcium 8.9 - 10.3 mg/dL 8.2   8.2   7.9    Total Protein 6.5 - 8.1 g/dL  6.1   5.9    Total Bilirubin 0.3 - 1.2 mg/dL  0.5   0.6  Alkaline Phos 38 - 126 U/L  116   125    AST 15 - 41 U/L  22   27    ALT 0 - 44 U/L  25   27       No results found for this or any previous visit (from the past 240 hour(s)).    ASSESSMENT & PLAN:       1. Disseminated MSSA with liver abscess, left foot osteo and abscess, 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   She has completed 6 weeks of appropriate antibiotic therapy  following hepatic abscess aspiration on 09/07/21 for disseminated Staph infection (pneumonia, liver abscess, osteomyelitis s/p amputation).  She remains overall very deconditioned but no findings to suggest relapsed infection.  Will continue to monitor off antibiotics and follow up as needed.   Raynelle Highland for Infectious Disease [facility] 10/27/2021, 3:18 PM  I have personally spent 30 minutes involved in face-to-face and non-face-to-face activities for this patient on the day of the visit. Professional time spent includes the following activities: Preparing to see the patient (review of tests), Obtaining and/or reviewing separately obtained history (admission/discharge record), Performing a medically appropriate examination and/or evaluation , Ordering medications/tests/procedures, referring and communicating with other health care professionals, Documenting clinical information in the EMR, Independently interpreting results (not separately reported), Communicating results to the patient/family/caregiver, Counseling and educating the patient/family/caregiver and Care coordination (not separately reported).

## 2021-11-02 ENCOUNTER — Emergency Department (HOSPITAL_COMMUNITY)
Admission: EM | Admit: 2021-11-02 | Discharge: 2021-11-03 | Disposition: A | Discharge status: Home / Self Care | Payer: Medicaid Other | Attending: Emergency Medicine | Admitting: Emergency Medicine

## 2021-11-02 ENCOUNTER — Emergency Department (HOSPITAL_COMMUNITY): Payer: Medicaid Other

## 2021-11-02 ENCOUNTER — Other Ambulatory Visit: Payer: Self-pay

## 2021-11-02 ENCOUNTER — Encounter (HOSPITAL_COMMUNITY): Payer: Self-pay

## 2021-11-02 DIAGNOSIS — K5903 Drug induced constipation: Secondary | ICD-10-CM | POA: Diagnosis not present

## 2021-11-02 DIAGNOSIS — Z79899 Other long term (current) drug therapy: Secondary | ICD-10-CM | POA: Insufficient documentation

## 2021-11-02 DIAGNOSIS — R000 Tachycardia, unspecified: Secondary | ICD-10-CM | POA: Insufficient documentation

## 2021-11-02 DIAGNOSIS — E8809 Other disorders of plasma-protein metabolism, not elsewhere classified: Secondary | ICD-10-CM | POA: Insufficient documentation

## 2021-11-02 DIAGNOSIS — T368X5A Adverse effect of other systemic antibiotics, initial encounter: Secondary | ICD-10-CM | POA: Diagnosis not present

## 2021-11-02 DIAGNOSIS — K209 Esophagitis, unspecified without bleeding: Secondary | ICD-10-CM | POA: Diagnosis not present

## 2021-11-02 DIAGNOSIS — R072 Precordial pain: Secondary | ICD-10-CM | POA: Diagnosis present

## 2021-11-02 LAB — LIPASE, BLOOD: Lipase: 49 U/L (ref 11–51)

## 2021-11-02 LAB — COMPREHENSIVE METABOLIC PANEL
ALT: 17 U/L (ref 0–44)
AST: 19 U/L (ref 15–41)
Albumin: 2.2 g/dL — ABNORMAL LOW (ref 3.5–5.0)
Alkaline Phosphatase: 95 U/L (ref 38–126)
Anion gap: 4 — ABNORMAL LOW (ref 5–15)
BUN: 35 mg/dL — ABNORMAL HIGH (ref 6–20)
CO2: 25 mmol/L (ref 22–32)
Calcium: 8.3 mg/dL — ABNORMAL LOW (ref 8.9–10.3)
Chloride: 105 mmol/L (ref 98–111)
Creatinine, Ser: 0.36 mg/dL — ABNORMAL LOW (ref 0.44–1.00)
GFR, Estimated: >60 mL/min (ref >60)
Glucose, Bld: 89 mg/dL (ref 70–99)
Potassium: 4.6 mmol/L (ref 3.5–5.1)
Sodium: 134 mmol/L — ABNORMAL LOW (ref 135–145)
Total Bilirubin: 0.8 mg/dL (ref 0.3–1.2)
Total Protein: 5.6 g/dL — ABNORMAL LOW (ref 6.5–8.1)

## 2021-11-02 LAB — TROPONIN I (HIGH SENSITIVITY)
Troponin I (High Sensitivity): 9 ng/L (ref <18)
Troponin I (High Sensitivity): 10 ng/L (ref <18)

## 2021-11-02 LAB — URINALYSIS, ROUTINE W REFLEX MICROSCOPIC
Bilirubin Urine: NEGATIVE
Glucose, UA: NEGATIVE mg/dL
Hgb urine dipstick: NEGATIVE
Ketones, ur: NEGATIVE mg/dL
Nitrite: NEGATIVE
Protein, ur: NEGATIVE mg/dL
Specific Gravity, Urine: 1.015 (ref 1.005–1.030)
pH: 8 (ref 5.0–8.0)

## 2021-11-02 LAB — CBC WITH DIFFERENTIAL/PLATELET
Abs Immature Granulocytes: 0.03 10*3/uL (ref 0.00–0.07)
Basophils Absolute: 0 10*3/uL (ref 0.0–0.1)
Basophils Relative: 1 %
Eosinophils Absolute: 0.1 10*3/uL (ref 0.0–0.5)
Eosinophils Relative: 1 %
HCT: 32.1 % — ABNORMAL LOW (ref 36.0–46.0)
Hemoglobin: 10.9 g/dL — ABNORMAL LOW (ref 12.0–15.0)
Immature Granulocytes: 0 %
Lymphocytes Relative: 26 %
Lymphs Abs: 2 10*3/uL (ref 0.7–4.0)
MCH: 28 pg (ref 26.0–34.0)
MCHC: 34 g/dL (ref 30.0–36.0)
MCV: 82.5 fL (ref 80.0–100.0)
Monocytes Absolute: 0.4 10*3/uL (ref 0.1–1.0)
Monocytes Relative: 5 %
Neutro Abs: 5.3 10*3/uL (ref 1.7–7.7)
Neutrophils Relative %: 67 %
Platelets: 123 10*3/uL — ABNORMAL LOW (ref 150–400)
RBC: 3.89 MIL/uL (ref 3.87–5.11)
RDW: 21.1 % — ABNORMAL HIGH (ref 11.5–15.5)
WBC: 7.8 10*3/uL (ref 4.0–10.5)
nRBC: 0 % (ref 0.0–0.2)

## 2021-11-02 LAB — POC URINE PREG, ED: Preg Test, Ur: NEGATIVE

## 2021-11-02 MED ORDER — ALUM & MAG HYDROXIDE-SIMETH 200-200-20 MG/5ML PO SUSP
30.0000 mL | Freq: Once | ORAL | Status: AC
  Administered 2021-11-03: 30 mL via ORAL
  Filled 2021-11-02: qty 30

## 2021-11-02 MED ORDER — HYDROCODONE-ACETAMINOPHEN 7.5-325 MG/15ML PO SOLN
10.0000 mL | Freq: Once | ORAL | Status: AC
  Administered 2021-11-03: 10 mL via ORAL
  Filled 2021-11-02: qty 15

## 2021-11-02 MED ORDER — MORPHINE SULFATE (PF) 4 MG/ML IV SOLN
4.0000 mg | Freq: Once | INTRAVENOUS | Status: AC
  Administered 2021-11-02: 4 mg via INTRAVENOUS
  Filled 2021-11-02: qty 1

## 2021-11-02 MED ORDER — IOHEXOL 350 MG/ML SOLN
100.0000 mL | Freq: Once | INTRAVENOUS | Status: AC | PRN
  Administered 2021-11-02: 100 mL via INTRAVENOUS

## 2021-11-02 MED ORDER — PANTOPRAZOLE SODIUM 40 MG PO TBEC
40.0000 mg | DELAYED_RELEASE_TABLET | Freq: Once | ORAL | Status: AC
  Administered 2021-11-03: 40 mg via ORAL
  Filled 2021-11-02: qty 1

## 2021-11-02 MED ORDER — LACTATED RINGERS IV BOLUS
1000.0000 mL | Freq: Once | INTRAVENOUS | Status: AC
  Administered 2021-11-02: 1000 mL via INTRAVENOUS

## 2021-11-02 NOTE — ED Provider Triage Note (Signed)
Emergency Medicine Provider Triage Evaluation Note  Terri [name] , a 46 y.o. female  was evaluated in triage.  Came from SNF.  Pt complains of chest pain, points to her epigastric region near the xiphoid process.  Has had some mild constipation this morning.  Denies fevers, urinary symptoms.  Feels dehydrated.  Has been nauseous with some bilious vomiting.  Denies bloody stools.  Review of Systems  Positive:  Negative: As above  Physical Exam  BP (!) 163/125   Pulse (!) 105   Temp 98.7 F (37.1 C) (Oral)   Resp 20   LMP  (LMP Unknown)   SpO2 100%  Gen:   Awake, tearful, cachectic Resp:  Normal effort  MSK:   Moves extremities without difficulty  Other:  Diffuse abdominal tenderness, with focus of the LLQ, epigastric region, RLQ, and xiphoid process.  Chest non-TTP.  Tachycardic at 105.  Afebrile.  Medical Decision Making  Medically screening exam initiated at 2:10 PM.  Appropriate orders placed.  [name] was informed that the remainder of the evaluation will be completed by another provider, this initial triage assessment does not replace that evaluation, and the importance of remaining in the ED until their evaluation is complete.     Prince Rome, PA-C 99/24/26 1415

## 2021-11-02 NOTE — ED Triage Notes (Signed)
Pt from [facility], tech reports constipation this morning. Pt states she has chest wall pain x 1 hour but when pointing to her pain she holds her abdomen.

## 2021-11-02 NOTE — ED Notes (Signed)
Purewick disconnected, brief and linens saturated in urine. New brief and linens placed. [place] sandwich and coke given. [facility] contacted for transport back to [facility]

## 2021-11-02 NOTE — ED Notes (Signed)
Pt using call bell as well as yelling out for someone to help her. This RN and charge nurse at bedside to adjust pt in the bed. Charge nurse remains at bedside

## 2021-11-02 NOTE — Discharge Instructions (Addendum)
Please follow-up closely with your primary doctor. You will also need to be on stool softeners to help with your constipation. This can be worsened by oral narcotic pain medications. If you need further refills of these, please call your primary doctor. If you develop any worsening symptoms, please return to the emergency room for re-evaluation.

## 2021-11-02 NOTE — ED Provider Notes (Signed)
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  She completed a course of IV Unasyn and was discharged on oral linezolid which she completed on 10/19/2021.  She describes her chest pain as heavy which radiates into her epigastric region, nothing makes the pain better or worse.  She stated that symptoms had been intermittent until this morning became acutely severe and constant.  She endorses associated nausea and constipation, last bowel movement was this morning.  She also states that she has had a low-grade fever of 100.  Denies diarrhea.  She states that she is not currently on antibiotics.  The history is provided by the patient and medical records.       Home Medications Prior to Admission medications   Medication Sig Start Date End Date Taking? Authorizing Provider  calcium carbonate (TUMS - DOSED IN MG ELEMENTAL CALCIUM) 500 MG chewable tablet Chew 1 tablet (200 mg of elemental calcium total) by mouth 3 (three) times daily. 10/12/21   Amin, Jeanella Flattery, MD  cloNIDine (CATAPRES - DOSED IN MG/24 HR) 0.1 mg/24hr patch Place 1 patch (0.1 mg total) onto the skin once a week. 10/16/21   Amin, Jeanella Flattery, MD  cyclobenzaprine (FLEXERIL) 5 MG tablet Take 1 tablet (5 mg total) by mouth 3 (three) times daily as needed for muscle spasms. 10/12/21   Amin, Jeanella Flattery, MD  dronabinol (MARINOL) 5 MG capsule Take 1 capsule  (5 mg total) by mouth 2 (two) times daily before lunch and supper. 10/12/21   Amin, Jeanella Flattery, MD  folic acid (FOLVITE) 1 MG tablet Take 1 tablet (1 mg total) by mouth daily. 10/13/21   Amin, Jeanella Flattery, MD  HYDROcodone-acetaminophen (NORCO) 7.5-325 MG tablet Take 1 tablet by mouth every 6 (six) hours as needed for moderate pain or severe pain. 10/12/21   Amin, Jeanella Flattery, MD  lisinopril (ZESTRIL) 10 MG tablet Take 1 tablet (10 mg total) by mouth at bedtime. 10/12/21   Amin, Jeanella Flattery, MD  pantoprazole (PROTONIX) 40 MG tablet Take 1 tablet (40 mg total) by mouth daily before breakfast. 10/12/21   Amin, Jeanella Flattery, MD  thiamine 100 MG tablet Take 1 tablet (100 mg total) by mouth daily. 10/13/21   Damita Lack, MD      Allergies    Nsaids and Ketorolac tromethamine    Review of Systems   Review of Systems  Constitutional:  Positive for fever.  Respiratory:  Positive for shortness of breath. Negative for cough.   Cardiovascular:  Positive for chest pain.  Gastrointestinal:  Positive for abdominal pain, constipation and nausea. Negative for blood in stool, diarrhea and vomiting.    Physical Exam Updated Vital Signs BP (!) 170/121   Pulse 77   Temp 98.3 F (36.8 C) (Oral)   Resp 15   LMP  (LMP Unknown)   SpO2 100%  Physical Exam Vitals and nursing note reviewed.  Constitutional:      General: She  is not in acute distress.    Appearance: She is cachectic. She is ill-appearing (chronically).     Comments: Appears older than stated age, cachectic  HENT:     Head: Normocephalic and atraumatic.     Mouth/Throat:     Mouth: Mucous membranes are dry.  Eyes:     Conjunctiva/sclera: Conjunctivae normal.  Cardiovascular:     Rate and Rhythm: Regular rhythm. Tachycardia present.     Pulses: Normal pulses.     Heart sounds: No murmur heard. Pulmonary:     Effort: Pulmonary effort is normal. No respiratory distress.     Breath sounds: Normal breath sounds.  Abdominal:      General: A surgical scar is present.     Palpations: Abdomen is soft.     Tenderness: There is abdominal tenderness in the right upper quadrant, epigastric area and periumbilical area. There is guarding.     Comments: Midline surgical scar  Musculoskeletal:     Cervical back: Neck supple.  Skin:    General: Skin is warm and dry.  Neurological:     Mental Status: She is alert.     ED Results / Procedures / Treatments   Labs (all labs ordered are listed, but only abnormal results are displayed) Labs Reviewed  CBC WITH DIFFERENTIAL/PLATELET - Abnormal; Notable for the following components:      Result Value   Hemoglobin 10.9 (*)    HCT 32.1 (*)    RDW 21.1 (*)    Platelets 123 (*)    All other components within normal limits  COMPREHENSIVE METABOLIC PANEL - Abnormal; Notable for the following components:   Sodium 134 (*)    BUN 35 (*)    Creatinine, Ser 0.36 (*)    Calcium 8.3 (*)    Total Protein 5.6 (*)    Albumin 2.2 (*)    Anion gap 4 (*)    All other components within normal limits  URINALYSIS, ROUTINE W REFLEX MICROSCOPIC - Abnormal; Notable for the following components:   APPearance HAZY (*)    Leukocytes,Ua TRACE (*)    Bacteria, UA RARE (*)    All other components within normal limits  LIPASE, BLOOD  POC URINE PREG, ED  TROPONIN I (HIGH SENSITIVITY)  TROPONIN I (HIGH SENSITIVITY)    EKG None  Radiology CT ABDOMEN PELVIS W CONTRAST  Result Date: 11/02/2021 CLINICAL DATA:  Epigastric pain. Concern for pulmonary embolism and bowel obstruction. Recent diagnosis of liver abscess. EXAM: CT ANGIOGRAPHY CHEST CT ABDOMEN AND PELVIS WITH CONTRAST TECHNIQUE: Multidetector CT imaging of the chest was performed using the standard protocol during bolus administration of intravenous contrast. Multiplanar CT image reconstructions and MIPs were obtained to evaluate the vascular anatomy. Multidetector CT imaging of the abdomen and pelvis was performed using the standard protocol  during bolus administration of intravenous contrast. RADIATION DOSE REDUCTION: This exam was performed according to the departmental dose-optimization program which includes automated exposure control, adjustment of the mA and/or kV according to patient size and/or use of iterative reconstruction technique. CONTRAST:  166m OMNIPAQUE IOHEXOL 350 MG/ML SOLN COMPARISON:  Chest CT dated 09/18/2021. CT abdomen pelvis dated 08/31/2021. FINDINGS: Evaluation is limited due to anasarca and cachexia. CTA CHEST FINDINGS Cardiovascular: There is no cardiomegaly or pericardial effusion. Mild atherosclerotic calcification of the thoracic aorta. No aneurysmal dilatation or dissection. Linear and bandlike nonocclusive density in the right lower lobe pulmonary artery branch (210/10) likely scarring and sequela prior PE. Acute PE is less likely. Clinical correlation  is recommended. No other pulmonary artery embolus identified. Mediastinum/Nodes: No obvious hilar or mediastinal adenopathy. Evaluation however is limited due to anasarca and cachexia. Apparent diffuse thickening of the esophagus which may be related to anasarca. Clinical correlation is recommended to evaluate for esophagitis. No mediastinal fluid collection. Lungs/Pleura: Background of emphysema. Scattered clusters of nodular and streaky densities predominantly involving the upper lobes may represent residual infection or sequela of prior infection. Recurrent pneumonia is not excluded overall significant interval improvement in pulmonary opacities compared to prior CT and significant improvement in aeration of the lungs. Interval complete resolution of the previously seen pleural effusion. No pneumothorax. The central airways are patent. Musculoskeletal: Diffuse subcutaneous edema and anasarca. Loss of subcutaneous fat and cachexia. No acute osseous pathology. Old fracture of lateral left ninth rib. Review of the MIP images confirms the above findings. CT ABDOMEN and  PELVIS FINDINGS Tiny linear pockets of air along the anterior wall of the stomach (axial 27/3), likely intraluminal. Small pneumoperitoneum is less likely but difficult to exclude given cachexia. Overall interval decrease in the mesenteric edema compared to prior CT. Hepatobiliary: There is heterogeneous enhancement of the right lobe of the liver. The right portal vein is not visualized and may be thrombosed. Multiple clots were seen in the main portal vein on the prior CT. The main portal vein and left portal vein are patent. Cholecystectomy. Mild biliary ductal dilatation, likely post cholecystectomy. No retained calcified stone noted in the central CBD. Pancreas: The pancreas is grossly unremarkable. Spleen: Mildly enlarged spleen measuring up to 15 cm in length. Focal area of scarring or infarct along the lateral upper pole of the spleen. Adrenals/Urinary Tract: The adrenal glands are unremarkable. Mild bilateral renal parenchyma atrophy with cortical irregularity and scarring. There is no hydronephrosis on either side. The urinary bladder is grossly unremarkable. Stomach/Bowel: Status post Roux-en-Y gastric bypass. Large amount of stool throughout the colon. No evidence of bowel obstruction. The appendix is normal. Vascular/Lymphatic: Moderate aortoiliac atherosclerotic disease. The IVC is grossly unremarkable. The splenic vein is not visualized and may be thrombosed. Collateral vessels noted from the spleen to the portal vein in the anterior abdomen. No portal venous gas. No obvious adenopathy. Reproductive: The uterus is anteverted. Other: Diffuse mesenteric edema and anasarca.  Cachexia. Musculoskeletal: Degenerative changes of the spine. No acute osseous pathology. Review of the MIP images confirms the above findings. IMPRESSION: 1. Linear and bandlike nonocclusive density in the right lower lobe pulmonary artery branch likely scarring and sequela of prior PE. Acute PE is less likely. 2. Interval complete  resolution of the previously seen pleural effusion. 3. Scattered clusters of nodular and streaky densities predominantly involving the upper lobes may represent residual infection or sequela of prior infection. Recurrent pneumonia is not excluded. 4. Apparent diffuse thickening of the esophagus which may be related to anasarca. Clinical correlation is recommended to evaluate for esophagitis. 5. Heterogeneous enhancement of the right lobe of the liver. The right portal vein is not visualized and may be thrombosed. Multiple clots were seen in the main portal vein on the prior CT. 6. The splenic vein is not visualized and appears thrombosed. Multiple collaterals noted in the anterior abdomen. 7. Overall interval decrease in the mesenteric edema compared to prior CT. 8. Large amount of stool throughout the colon. No evidence of bowel obstruction. Normal appendix. 9. Aortic Atherosclerosis (ICD10-I70.0) and Emphysema (ICD10-J43.9). Electronically Signed   By: Anner Crete M.D.   On: 11/02/2021 20:21   CT Angio Chest PE W  and/or Wo Contrast  Result Date: 11/02/2021 CLINICAL DATA:  Epigastric pain. Concern for pulmonary embolism and bowel obstruction. Recent diagnosis of liver abscess. EXAM: CT ANGIOGRAPHY CHEST CT ABDOMEN AND PELVIS WITH CONTRAST TECHNIQUE: Multidetector CT imaging of the chest was performed using the standard protocol during bolus administration of intravenous contrast. Multiplanar CT image reconstructions and MIPs were obtained to evaluate the vascular anatomy. Multidetector CT imaging of the abdomen and pelvis was performed using the standard protocol during bolus administration of intravenous contrast. RADIATION DOSE REDUCTION: This exam was performed according to the departmental dose-optimization program which includes automated exposure control, adjustment of the mA and/or kV according to patient size and/or use of iterative reconstruction technique. CONTRAST:  149m OMNIPAQUE IOHEXOL 350  MG/ML SOLN COMPARISON:  Chest CT dated 09/18/2021. CT abdomen pelvis dated 08/31/2021. FINDINGS: Evaluation is limited due to anasarca and cachexia. CTA CHEST FINDINGS Cardiovascular: There is no cardiomegaly or pericardial effusion. Mild atherosclerotic calcification of the thoracic aorta. No aneurysmal dilatation or dissection. Linear and bandlike nonocclusive density in the right lower lobe pulmonary artery branch (210/10) likely scarring and sequela prior PE. Acute PE is less likely. Clinical correlation is recommended. No other pulmonary artery embolus identified. Mediastinum/Nodes: No obvious hilar or mediastinal adenopathy. Evaluation however is limited due to anasarca and cachexia. Apparent diffuse thickening of the esophagus which may be related to anasarca. Clinical correlation is recommended to evaluate for esophagitis. No mediastinal fluid collection. Lungs/Pleura: Background of emphysema. Scattered clusters of nodular and streaky densities predominantly involving the upper lobes may represent residual infection or sequela of prior infection. Recurrent pneumonia is not excluded overall significant interval improvement in pulmonary opacities compared to prior CT and significant improvement in aeration of the lungs. Interval complete resolution of the previously seen pleural effusion. No pneumothorax. The central airways are patent. Musculoskeletal: Diffuse subcutaneous edema and anasarca. Loss of subcutaneous fat and cachexia. No acute osseous pathology. Old fracture of lateral left ninth rib. Review of the MIP images confirms the above findings. CT ABDOMEN and PELVIS FINDINGS Tiny linear pockets of air along the anterior wall of the stomach (axial 27/3), likely intraluminal. Small pneumoperitoneum is less likely but difficult to exclude given cachexia. Overall interval decrease in the mesenteric edema compared to prior CT. Hepatobiliary: There is heterogeneous enhancement of the right lobe of the liver.  The right portal vein is not visualized and may be thrombosed. Multiple clots were seen in the main portal vein on the prior CT. The main portal vein and left portal vein are patent. Cholecystectomy. Mild biliary ductal dilatation, likely post cholecystectomy. No retained calcified stone noted in the central CBD. Pancreas: The pancreas is grossly unremarkable. Spleen: Mildly enlarged spleen measuring up to 15 cm in length. Focal area of scarring or infarct along the lateral upper pole of the spleen. Adrenals/Urinary Tract: The adrenal glands are unremarkable. Mild bilateral renal parenchyma atrophy with cortical irregularity and scarring. There is no hydronephrosis on either side. The urinary bladder is grossly unremarkable. Stomach/Bowel: Status post Roux-en-Y gastric bypass. Large amount of stool throughout the colon. No evidence of bowel obstruction. The appendix is normal. Vascular/Lymphatic: Moderate aortoiliac atherosclerotic disease. The IVC is grossly unremarkable. The splenic vein is not visualized and may be thrombosed. Collateral vessels noted from the spleen to the portal vein in the anterior abdomen. No portal venous gas. No obvious adenopathy. Reproductive: The uterus is anteverted. Other: Diffuse mesenteric edema and anasarca.  Cachexia. Musculoskeletal: Degenerative changes of the spine. No acute osseous pathology. Review of  the MIP images confirms the above findings. IMPRESSION: 1. Linear and bandlike nonocclusive density in the right lower lobe pulmonary artery branch likely scarring and sequela of prior PE. Acute PE is less likely. 2. Interval complete resolution of the previously seen pleural effusion. 3. Scattered clusters of nodular and streaky densities predominantly involving the upper lobes may represent residual infection or sequela of prior infection. Recurrent pneumonia is not excluded. 4. Apparent diffuse thickening of the esophagus which may be related to anasarca. Clinical correlation  is recommended to evaluate for esophagitis. 5. Heterogeneous enhancement of the right lobe of the liver. The right portal vein is not visualized and may be thrombosed. Multiple clots were seen in the main portal vein on the prior CT. 6. The splenic vein is not visualized and appears thrombosed. Multiple collaterals noted in the anterior abdomen. 7. Overall interval decrease in the mesenteric edema compared to prior CT. 8. Large amount of stool throughout the colon. No evidence of bowel obstruction. Normal appendix. 9. Aortic Atherosclerosis (ICD10-I70.0) and Emphysema (ICD10-J43.9). Electronically Signed   By: Anner Crete M.D.   On: 11/02/2021 20:21   DG Chest 2 View  Result Date: 11/02/2021 CLINICAL DATA:  Chest pain. EXAM: CHEST - 2 VIEW COMPARISON:  June 3, 23. FINDINGS: Right-sided skin folds with vague right midlung opacities. No visible pneumothorax. No pleural effusions. Cardiomediastinal silhouette is unchanged. Mild hyperinflation. No displaced fracture. IMPRESSION: Right-sided skin folds with vague right midlung opacities, similar to recent chest radiograph from October 24, 2021. Findings could represent artifact or pneumonia. Recommend close short interval follow-up to ensure resolution. Electronically Signed   By: Margaretha Sheffield M.D.   On: 11/02/2021 15:08    Procedures Procedures    Medications Ordered in ED Medications  lactated ringers bolus 1,000 mL (0 mLs Intravenous Stopped 11/02/21 1740)  morphine (PF) 4 MG/ML injection 4 mg (4 mg Intravenous Given 11/02/21 1620)  morphine (PF) 4 MG/ML injection 4 mg (4 mg Intravenous Given 11/02/21 1757)  iohexol (OMNIPAQUE) 350 MG/ML injection 100 mL (100 mLs Intravenous Contrast Given 11/02/21 1951)  alum & mag hydroxide-simeth (MAALOX/MYLANTA) 200-200-20 MG/5ML suspension 30 mL (30 mLs Oral Given 11/03/21 0004)  pantoprazole (PROTONIX) EC tablet 40 mg (40 mg Oral Given 11/03/21 0004)  HYDROcodone-acetaminophen (HYCET) 7.5-325 mg/15 ml solution  10 mL (10 mLs Oral Given 11/03/21 0006)    ED Course/ Medical Decision Making/ A&P                           Medical Decision Making Amount and/or Complexity of Data Reviewed External Data Reviewed: labs, radiology and notes. Labs: ordered. Radiology: ordered.  Risk OTC drugs. Prescription drug management. Parenteral controlled substances. Decision regarding hospitalization.   46 year old female with a complex past medical history as above presents today with substernal chest pain.epigastric abdominal pain. Initial emergent differential includes bowel obstruction, anastomotic ulcer perforation, recurrence of her liver abscess, pancreatitis. Additionally considered underlying ACS and PE as she is not currently anticoagulated given her recurrent GI bleeds. She is also high risk for possible septic emboli given her recent treatment for presumed endocarditis. On arrival, she is hemodynamically stable, afebrile in the ED, breathing comfortably on room air.  I reviewed and interpreted the patient's labs which are overall reassuring.  CBC without significant leukocytosis or leukopenia, hemoglobin at baseline.  CMP without significant electrolyte abnormalities, no evidence of renal or liver dysfunction.  Similar hypoalbuminemia as noted from prior.  Troponin stable at 9 ->  10 setting of a nonischemic EKG, very low suspicion for ACS.  CTA chest and abdomen/pelvis were obtained.  CT imaging notable for multiple chronic appearing findings.  Low suspicion for acute thromboembolic disease with apparent resolution of prior pleural effusions and possible underlying scarring.  She does have evidence of esophagitis which would clinically fit her presentation today.  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  Large amount of stool was also noted throughout the colon without evidence for an acute obstruction.  At this time,  very low suspicion for new emergent pathology.  Patient was treated with several doses of IV morphine and offered a GI cocktail and home dose of Norco.  Advised patient on her results and labs and encouraged her to follow-up closely with her primary care provider.  Additionally advised patient on her constipation and encouraged bowel regimen at home.  Nursing was able to set up transport back to her rehab facility.  At this time, patient is stable for discharge.  Final Clinical Impression(s) / ED Diagnoses Final diagnoses:  Drug-induced constipation  Esophagitis    Rx / DC Orders ED Discharge Orders     None         Racquel Arkin, [place], MD 11/03/21 2706    Sherwood Gambler, MD 11/03/21 1750

## 2021-11-02 NOTE — ED Notes (Signed)
Patient transported to X-ray 

## 2021-11-02 NOTE — ED Notes (Signed)
[facility] was called, 6-7 on list

## 2021-11-03 ENCOUNTER — Other Ambulatory Visit (HOSPITAL_COMMUNITY): Payer: Self-pay | Admitting: Adult Health

## 2021-11-03 NOTE — ED Notes (Signed)
Pt woken up out of sleep complaining of 10/10 abd pain. Fuller Plan MD made aware

## 2021-11-03 NOTE — ED Notes (Signed)
RN reviewed discharge instructions with pt\ and transport. Pt verbalized understanding and had no further questions. VSS upon discharge.

## 2021-11-10 IMAGING — CR DG CHEST 2V
2 series · 2 of 2 positions shown · non-contrast
Comparison: 03/12/2021.

CLINICAL DATA: Shortness of breath.

EXAM:
CHEST - 2 VIEW

[w chest pa]
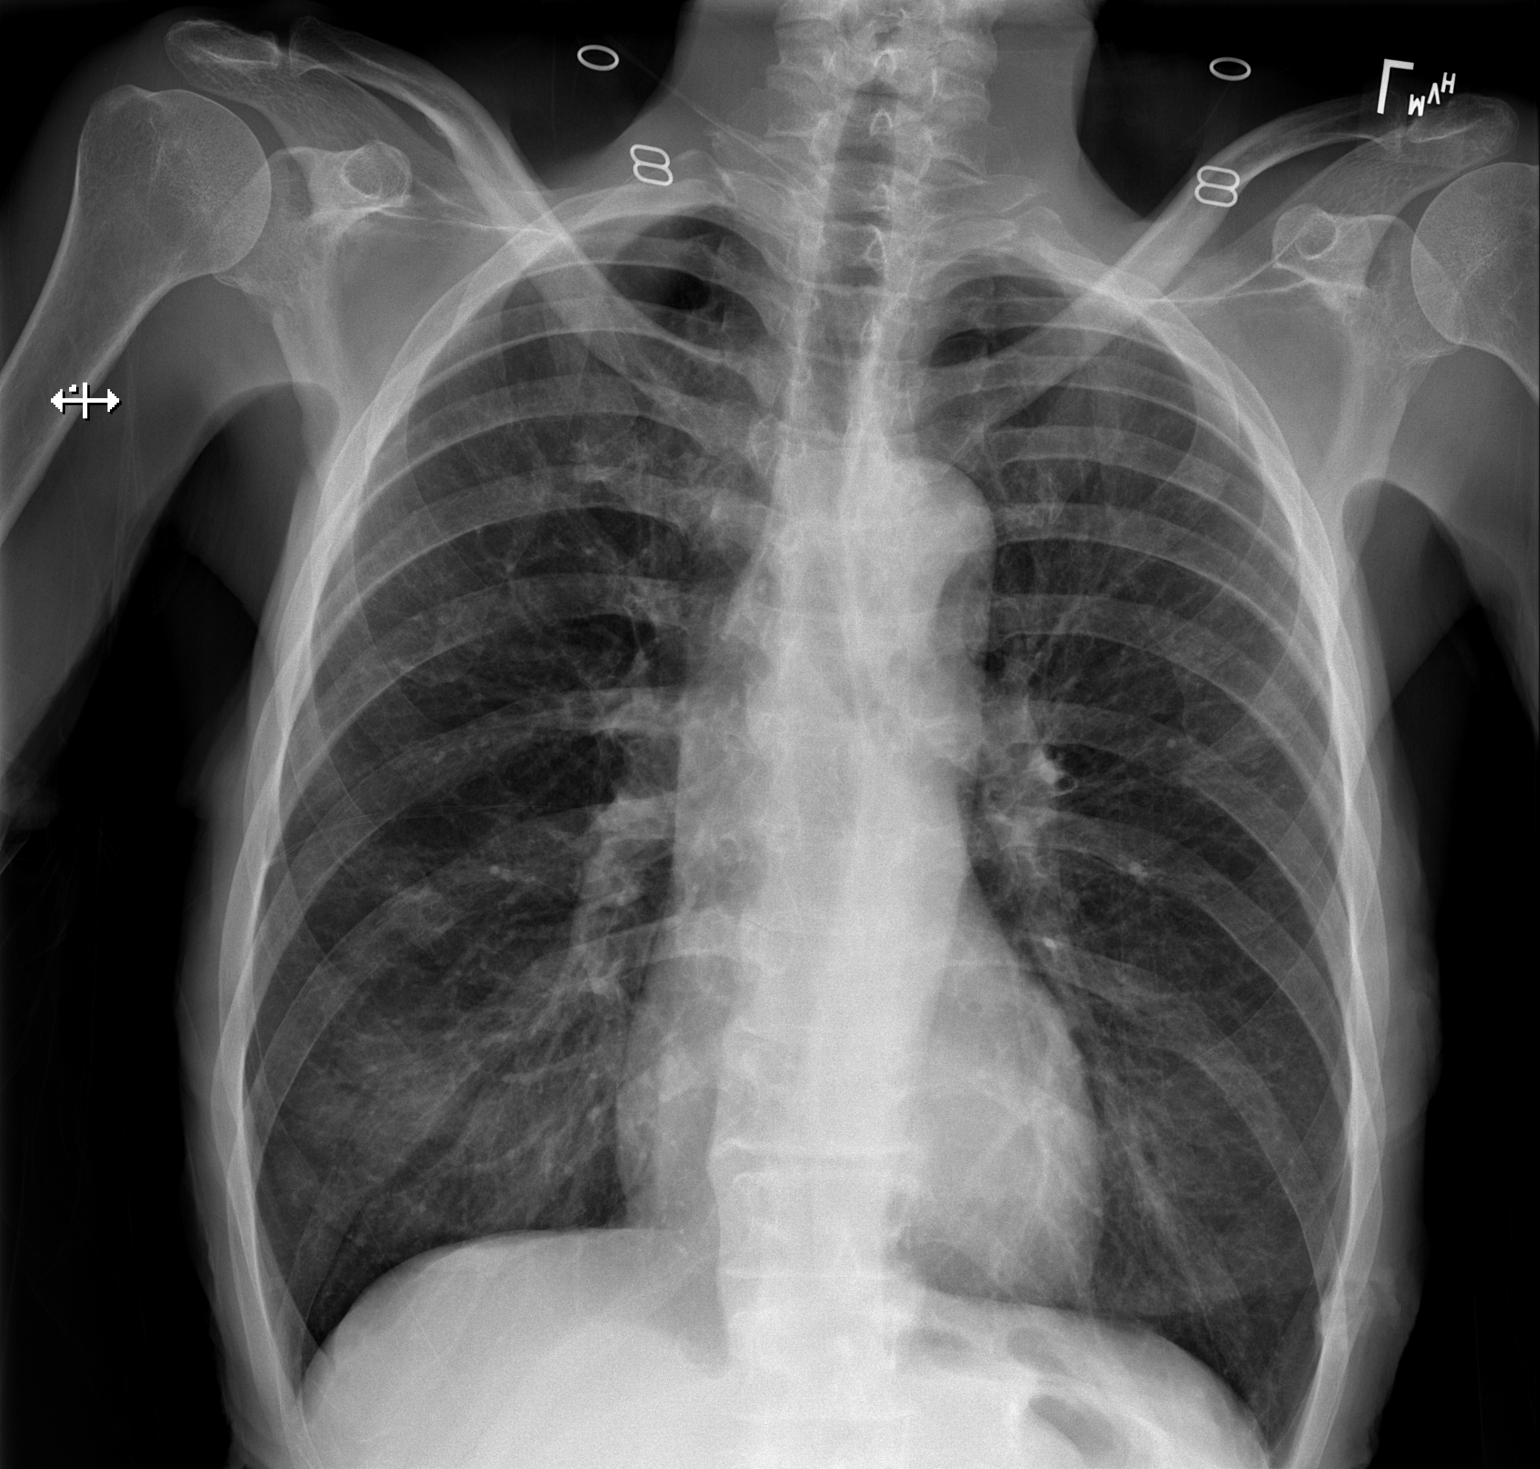

[w chest lat]
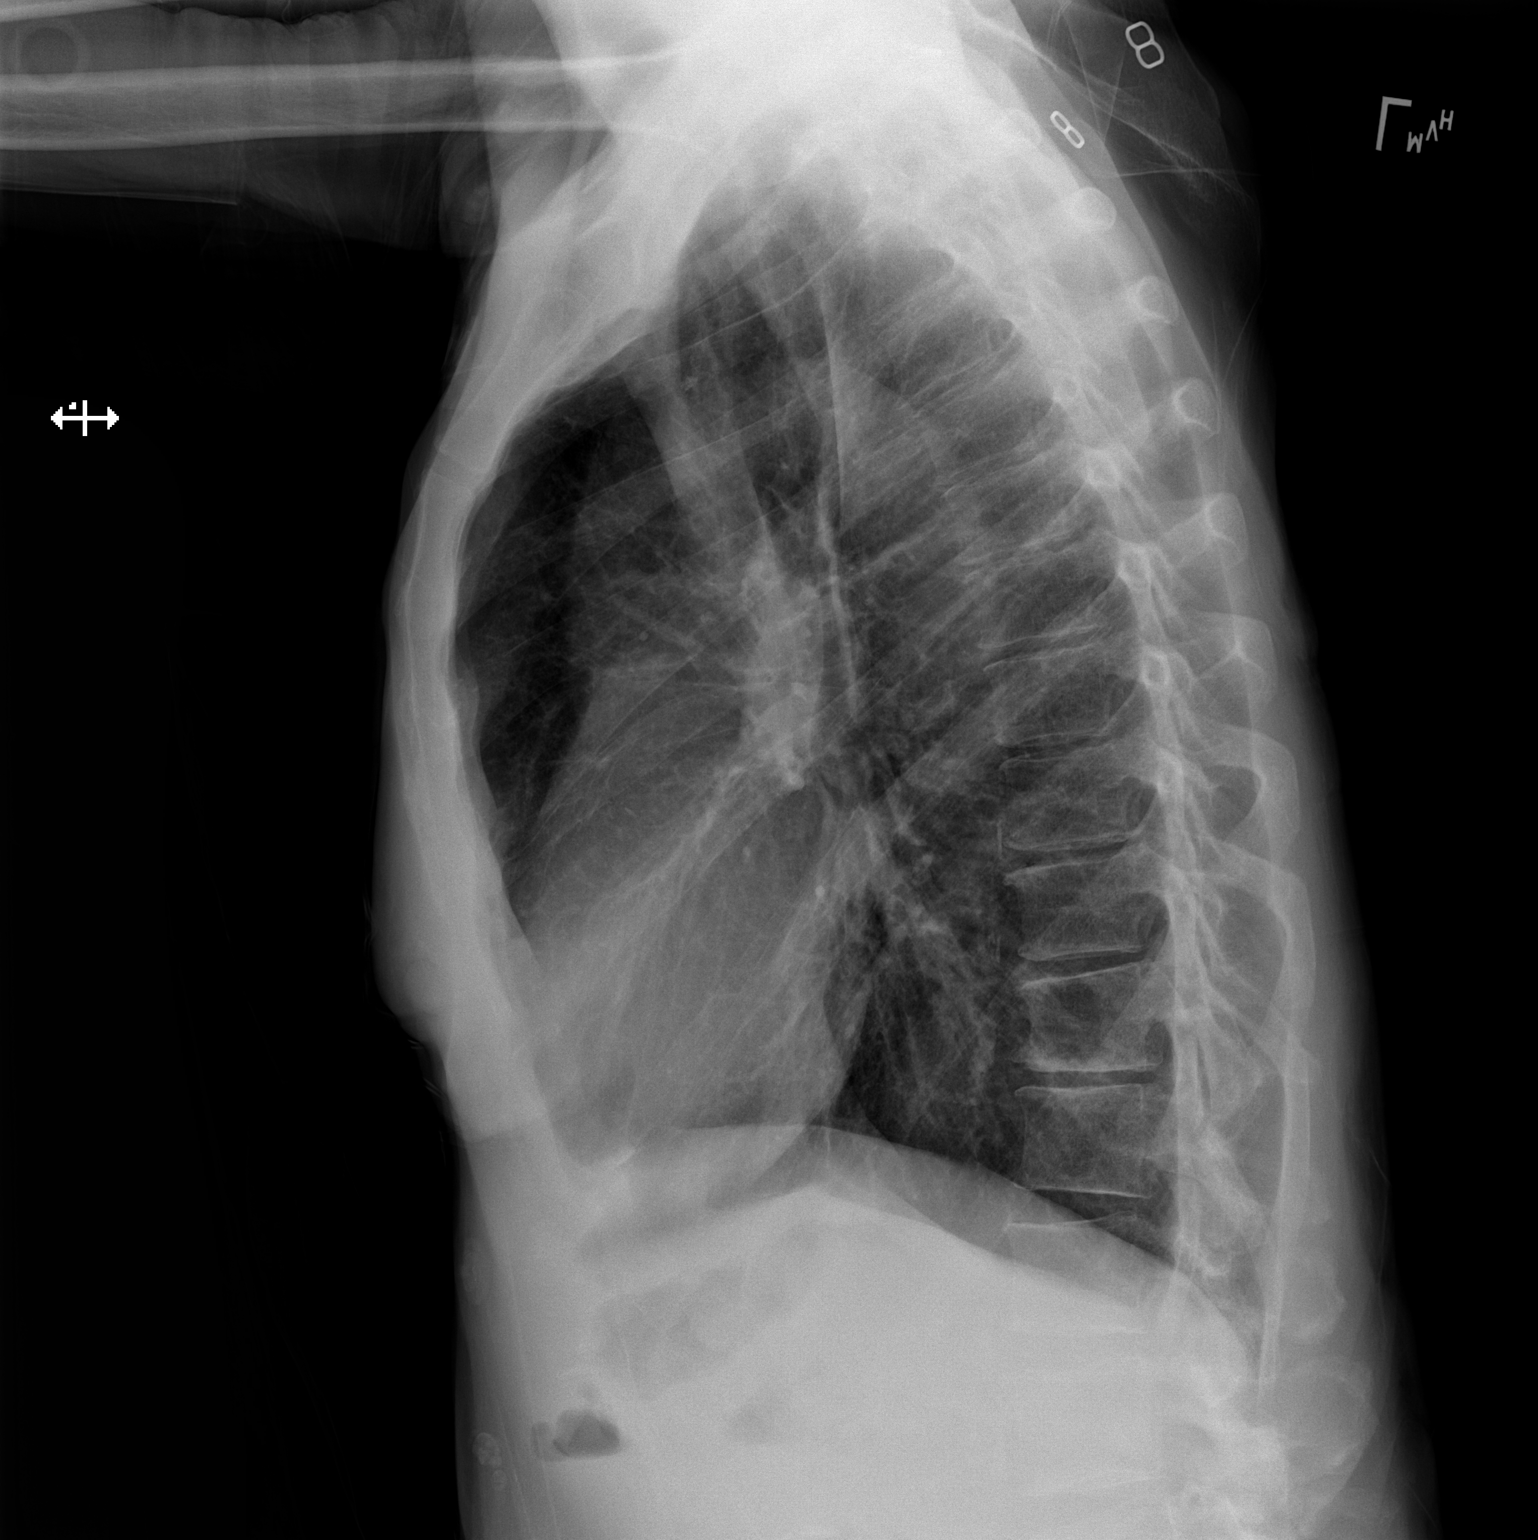

[2 of 2 positions shown; findings below may reference images not displayed]

FINDINGS: The heart size and mediastinal contours are within normal limits.
Hyperinflation of the lungs is noted. No consolidation, effusion, or
pneumothorax. Old rib fractures are noted on the left with bone
callus formation.
IMPRESSION: Hyperinflation of the lungs with no acute process.

## 2021-11-12 IMAGING — DX DG CHEST 1V PORT
2 series · 2 of 2 positions shown · non-contrast
Comparison: 03/30/2021

CLINICAL DATA: Shortness of breath, cough x3 days

EXAM:
PORTABLE CHEST 1 VIEW

[chest ap (1 of 2)]
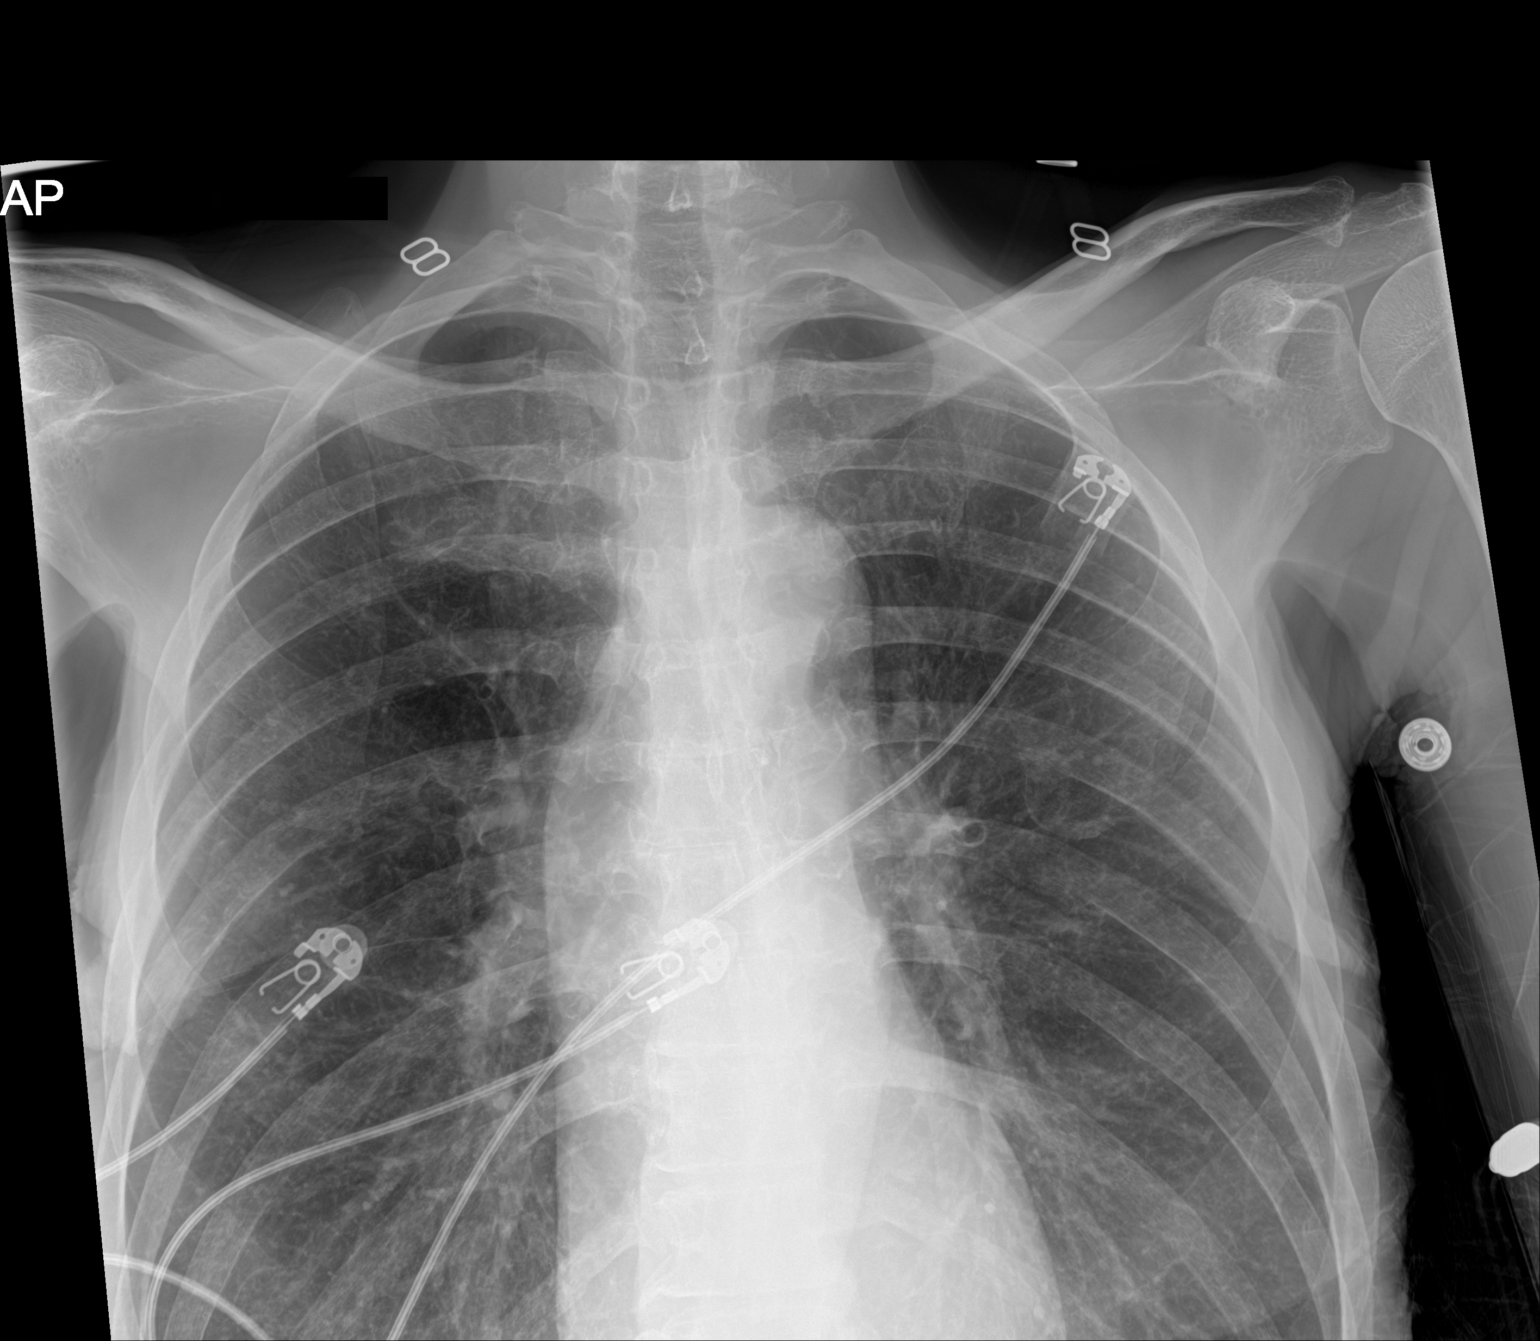

[chest ap (2 of 2)]
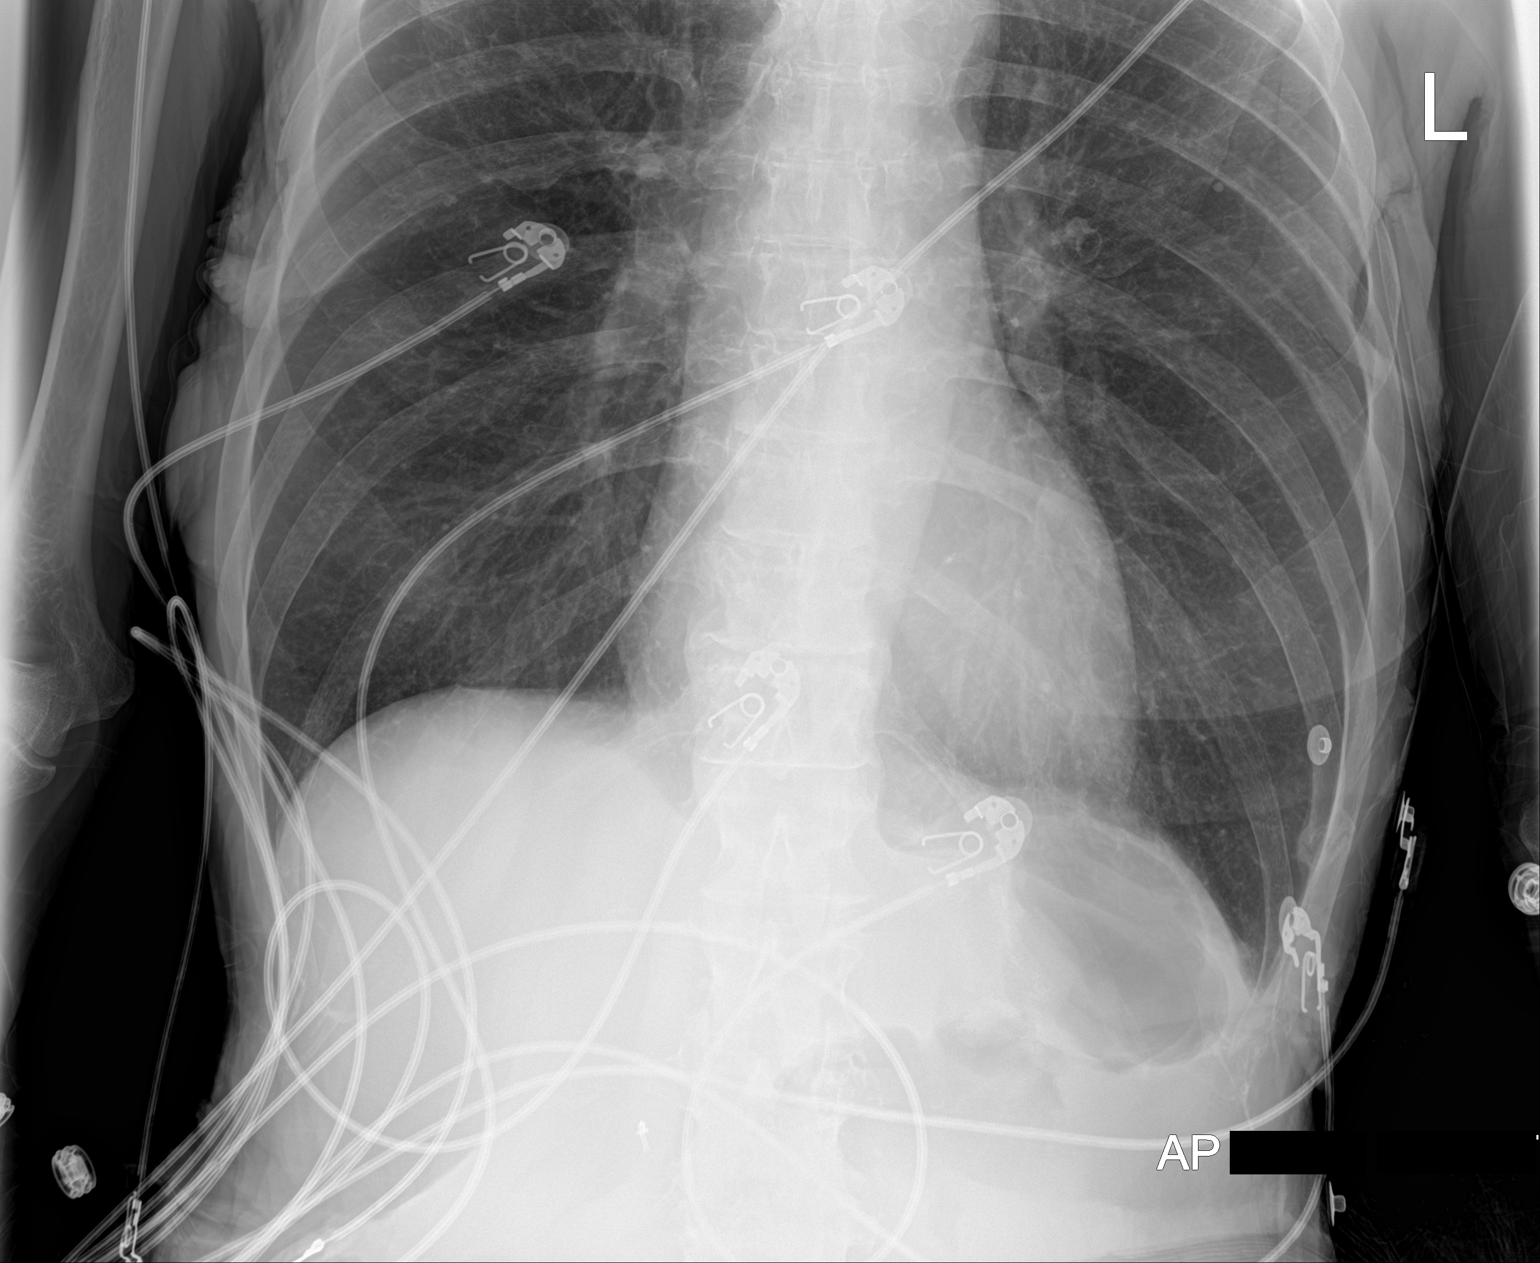

[2 of 2 positions shown; findings below may reference images not displayed]

FINDINGS: Lungs are clear.  No pleural effusion or pneumothorax.

The heart is normal in size.
IMPRESSION: No evidence of acute cardiopulmonary disease.

## 2021-11-13 IMAGING — CT CT ANGIO CHEST
2 of 7 series · 19 of 46 positions shown · IV contrast (APPLIED)
Comparison: 02/26/2021

CLINICAL DATA: Wheezing and shortness of breath. Recent cocaine
use.

EXAM:
CT ANGIOGRAPHY CHEST WITH CONTRAST
TECHNIQUE: Multidetector CT imaging of the chest was performed using the
standard protocol during bolus administration of intravenous
contrast. Multiplanar CT image reconstructions and MIPs were
obtained to evaluate the vascular anatomy.
CONTRAST:  50mL OMNIPAQUE IOHEXOL 350 MG/ML SOLN

[Series 7: thins · axial · 0.61mm/px · z∈[+1081,+1378]mm · 16 of 478 slices shown]
[im 27/478  lung]
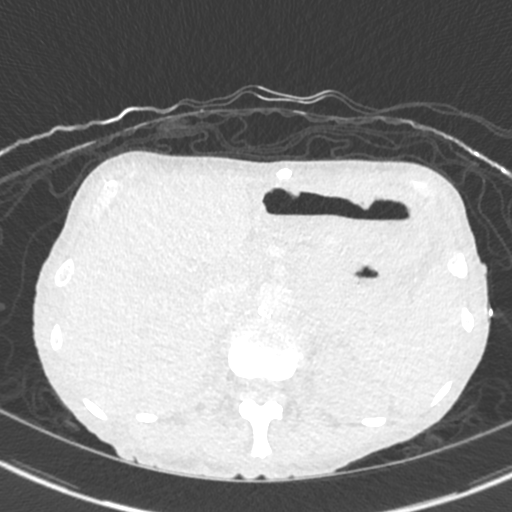
[im 54/478  soft-tissue]
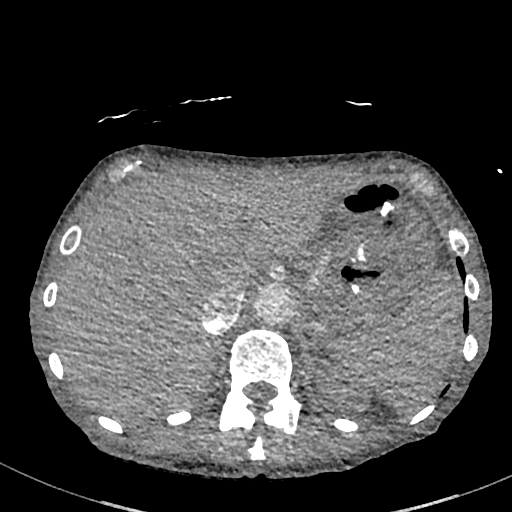
[im 80/478  lung]
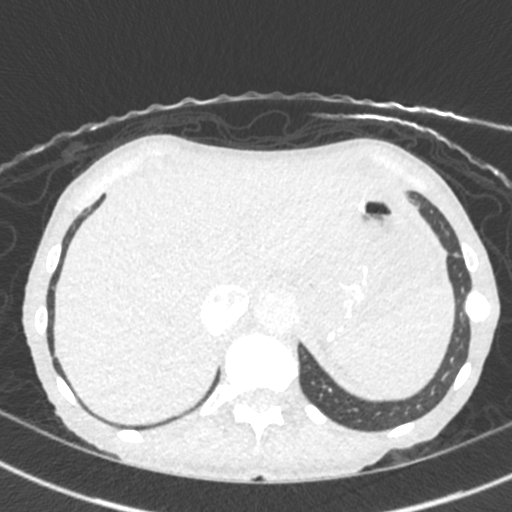
[im 107/478  soft-tissue]
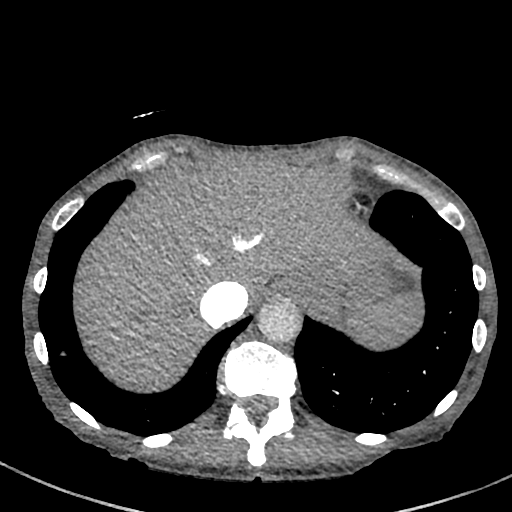
[im 133/478  lung]
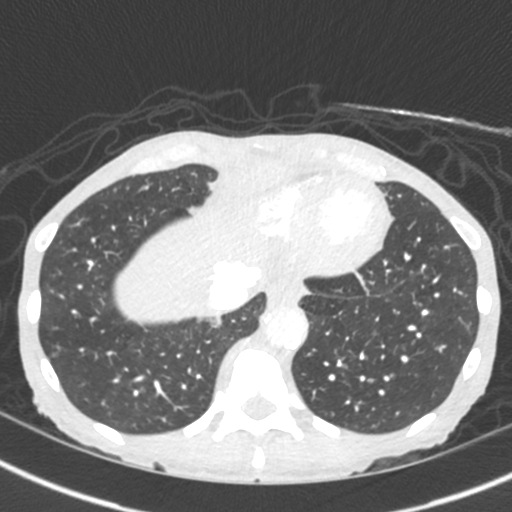
[im 160/478  soft-tissue]
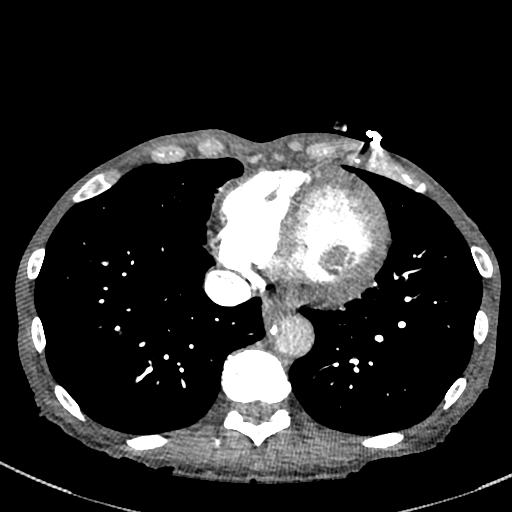
[im 186/478  lung]
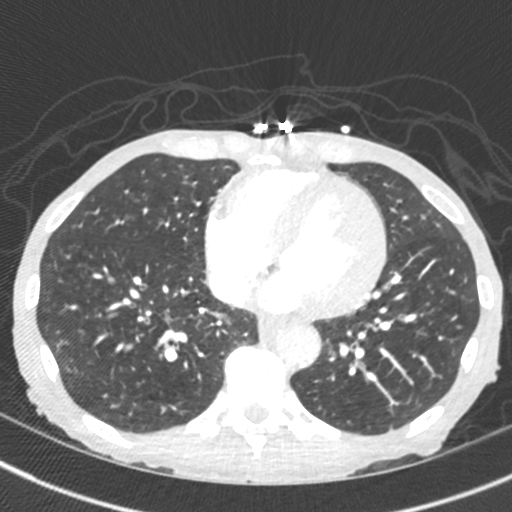
[im 213/478  soft-tissue]
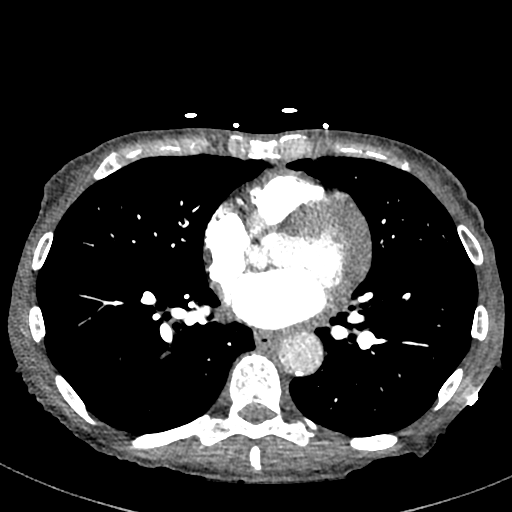
[im 266/478  lung]
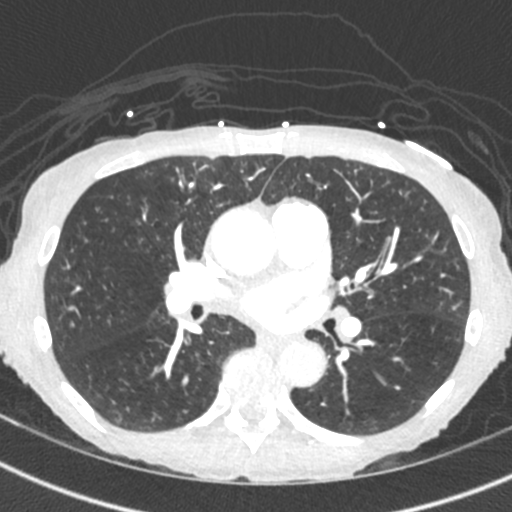
[im 292/478  soft-tissue]
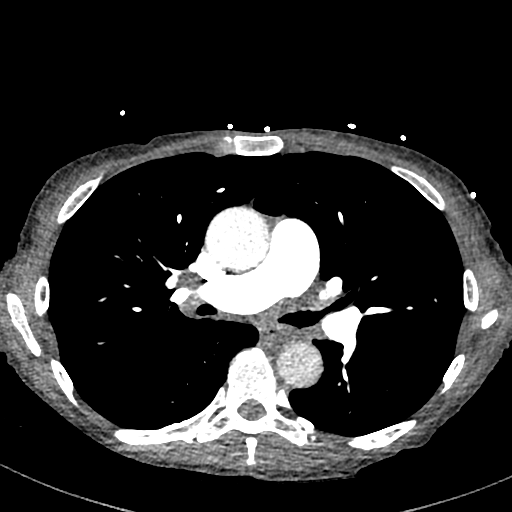
[im 319/478  lung]
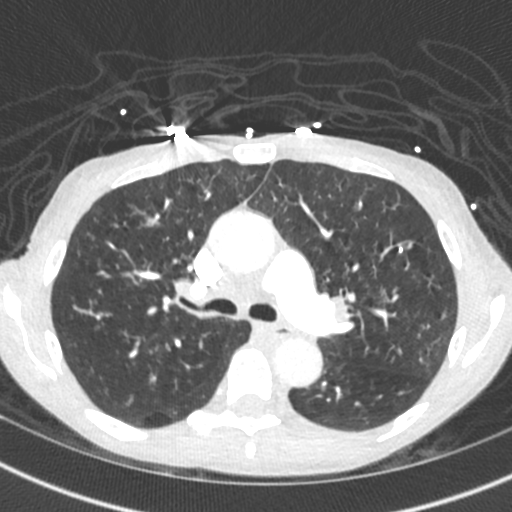
[im 345/478  soft-tissue]
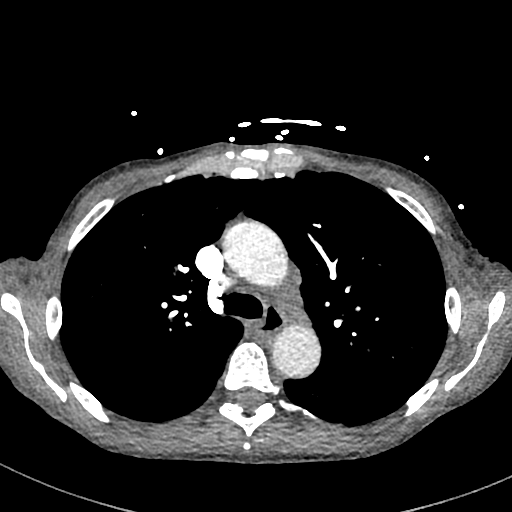
[im 372/478  lung]
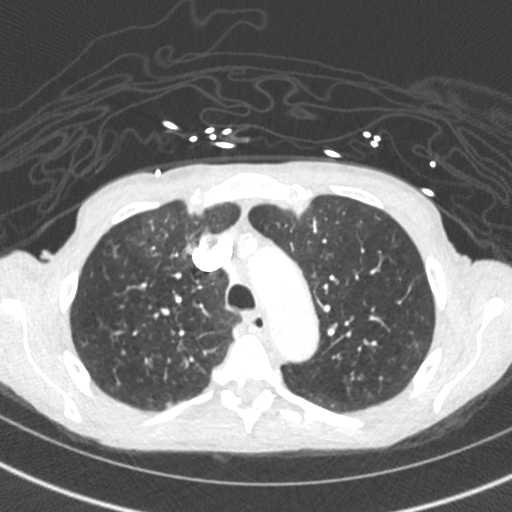
[im 398/478  soft-tissue]
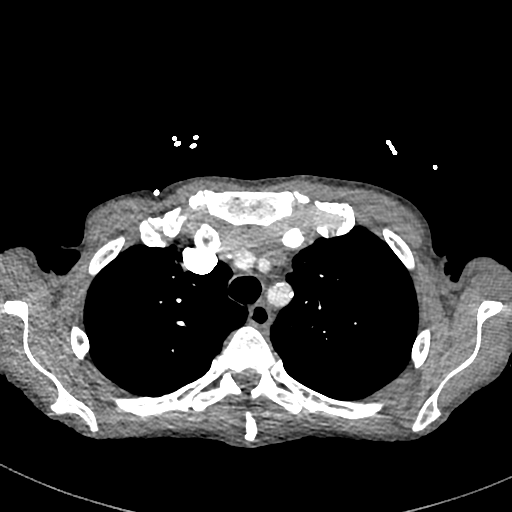
[im 425/478  lung]
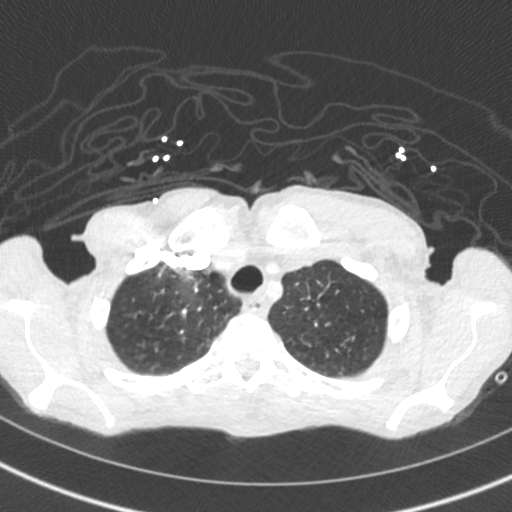
[im 451/478  soft-tissue]
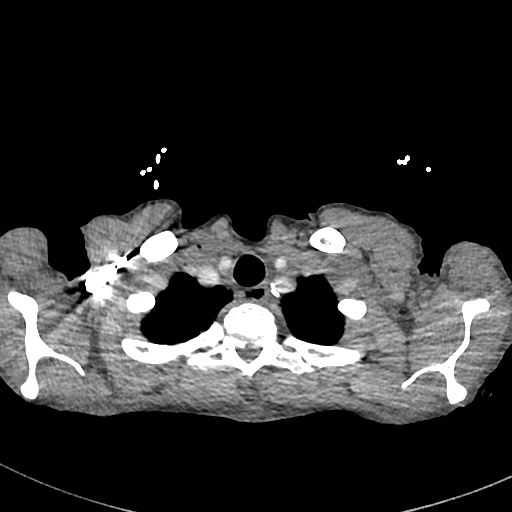

[Series 8: cor · coronal · 0.66mm/px · 3 of 110 slices shown]
[im 28/110  soft-tissue]
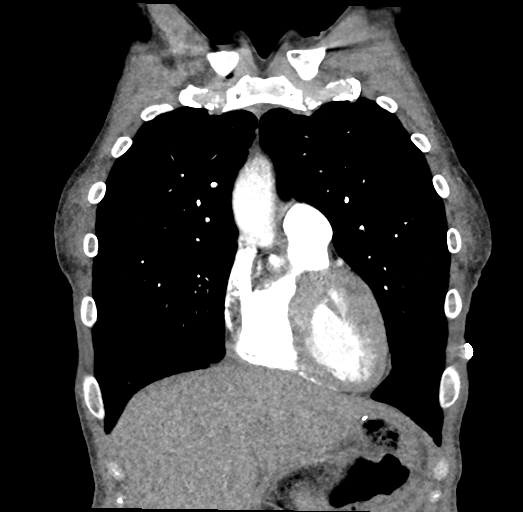
[im 55/110  soft-tissue]
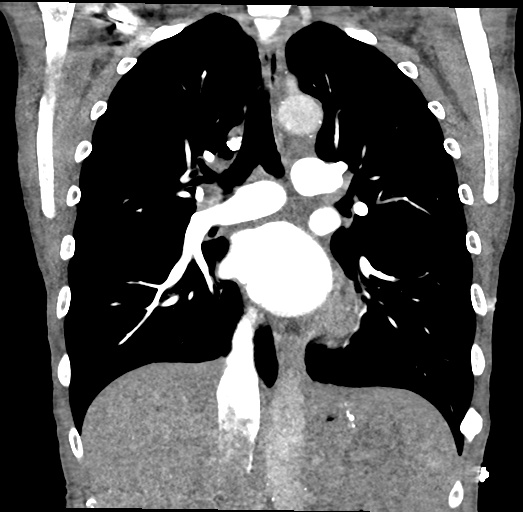
[im 82/110  soft-tissue]
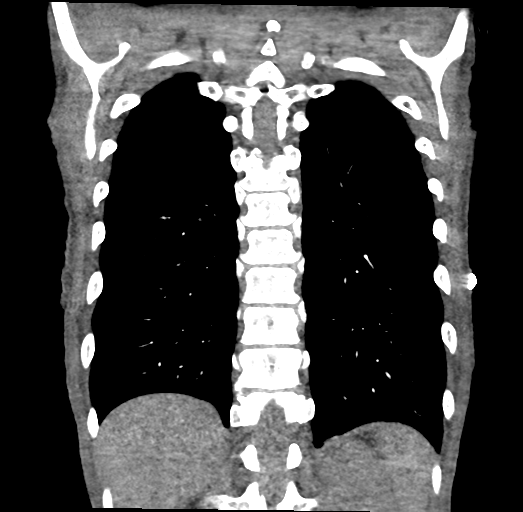

[19 of 46 positions shown; findings below may reference images not displayed]

FINDINGS: Cardiovascular: Satisfactory opacification of the pulmonary arteries
to the segmental level. No evidence of pulmonary embolism. Normal
heart size. No pericardial effusion. Age advanced atherosclerosis of
the aorta and coronaries.

Mediastinum/Nodes: Negative for adenopathy or mass

Lungs/Pleura: Generalized airway thickening patchy reticulation and
micro nodularity. The micro nodularity is greatest in the apical
lungs, question smoking-related respiratory bronchiolitis.

Upper Abdomen: Gastric bypass with persisting gas collection near
the gastroenteric anastomosis, most consistent with marginal ulcer.
This gaseous collection measures up to 2.7 cm in diameter. No
pneumoperitoneum.

Musculoskeletal: No acute finding

Review of the MIP images confirms the above findings.
IMPRESSION: 1. Negative for pulmonary embolism.
2. Bronchitis and bronchiolitis.
3. Gastric bypass with marginal ulcer that is known from prior CTs.

## 2021-11-24 IMAGING — DX DG CHEST 1V PORT
1 series · 1 of 1 positions shown · non-contrast
Comparison: 04/01/2021

CLINICAL DATA: Altered mental status

EXAM:
PORTABLE CHEST 1 VIEW

[chest ap]
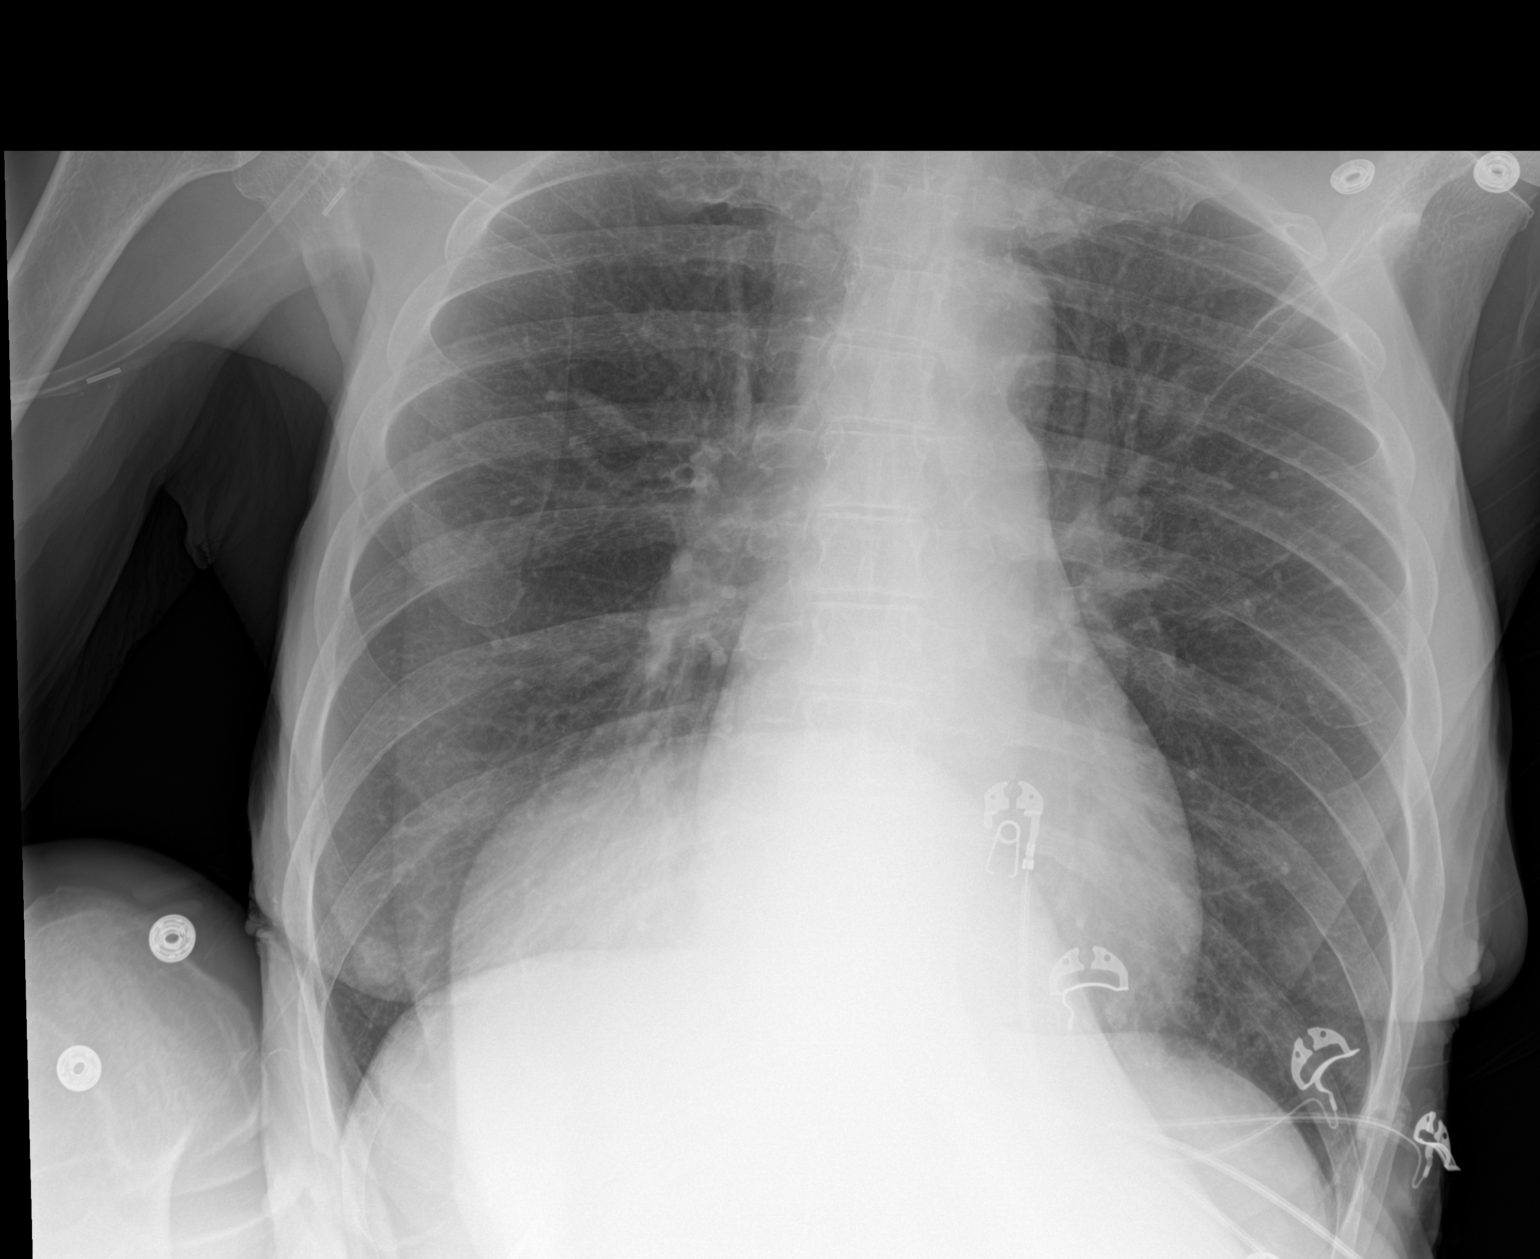

[1 of 1 positions shown; findings below may reference images not displayed]

FINDINGS: The heart size and mediastinal contours are within normal limits.
Both lungs are clear. The visualized skeletal structures are
unremarkable.
IMPRESSION: No active disease.

## 2021-11-24 IMAGING — CT CT HEAD W/O CM
3 series · 16 of 47 positions shown, 19 images · non-contrast
Comparison: Head CT 12/28/2012.

CLINICAL DATA: Altered mental status.

EXAM:
CT HEAD WITHOUT CONTRAST
TECHNIQUE: Contiguous axial images were obtained from the base of the skull
through the vertex without intravenous contrast.

[Series 3: head wo · axial · 0.47mm/px · z∈[-153,+2]mm · 10 of 37 slices shown, 13 images]
[im 3/37  brain]
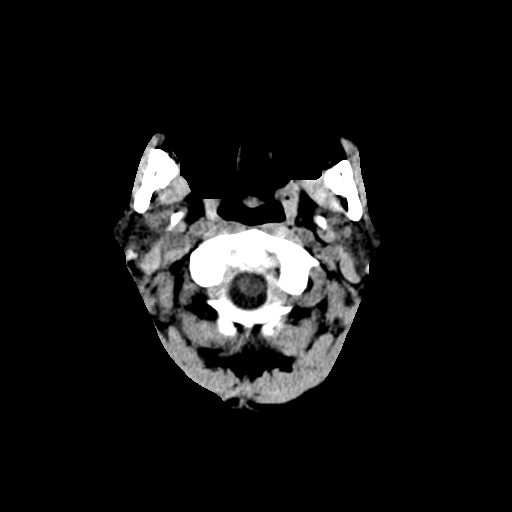
[im 3/37  bone]
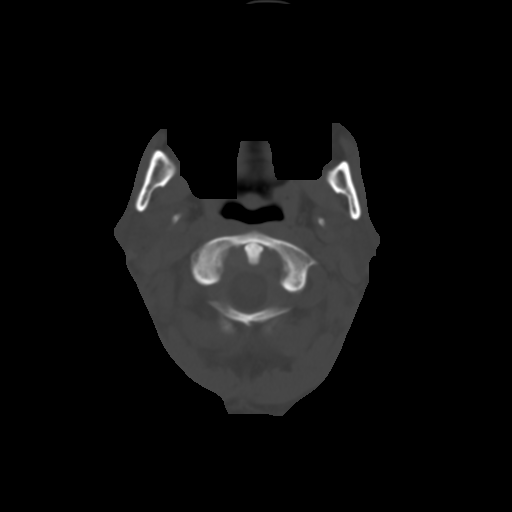
[im 7/37  brain]
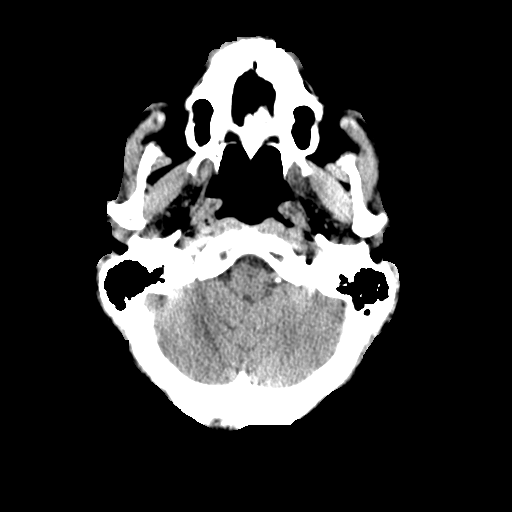
[im 10/37  brain]
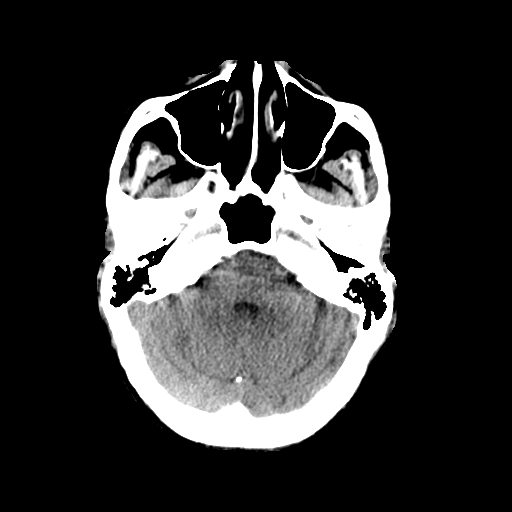
[im 13/37  brain]
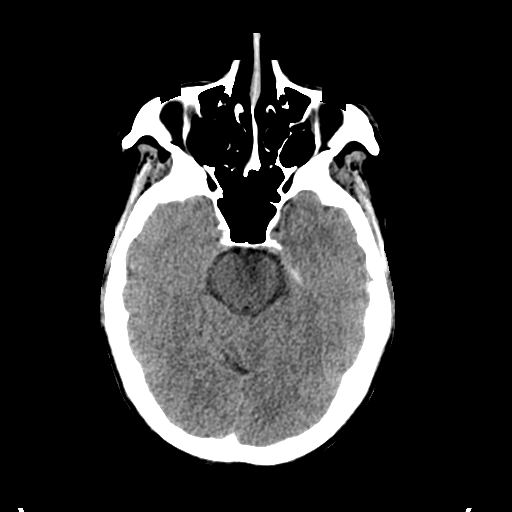
[im 17/37  brain]
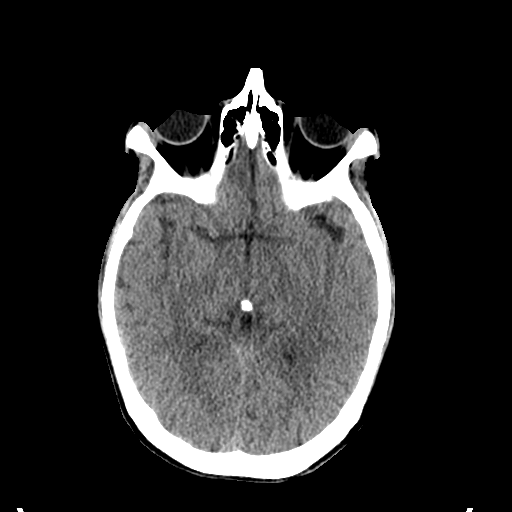
[im 17/37  bone]
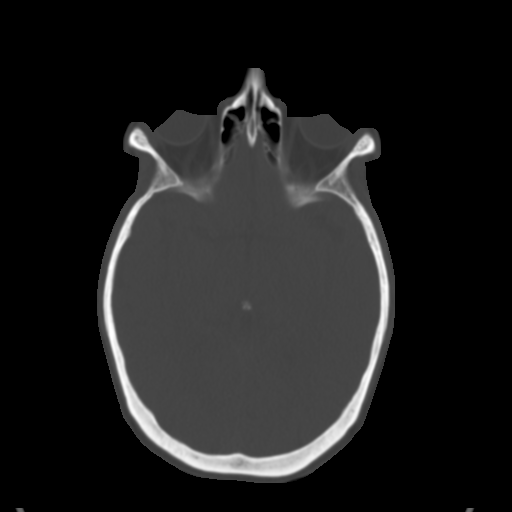
[im 20/37  brain]
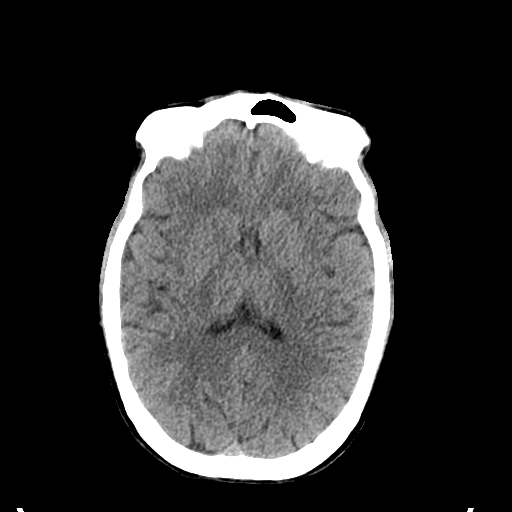
[im 24/37  brain]
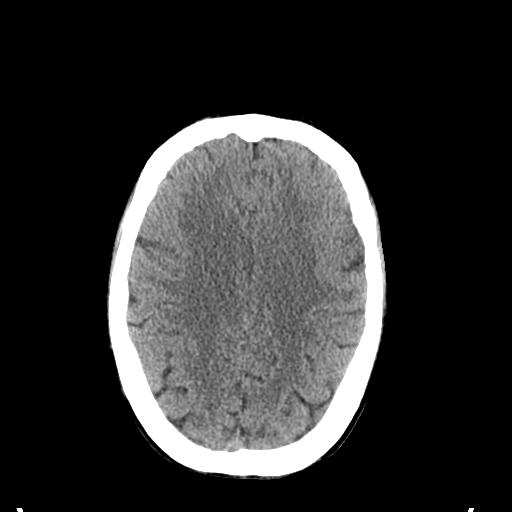
[im 28/37  brain]
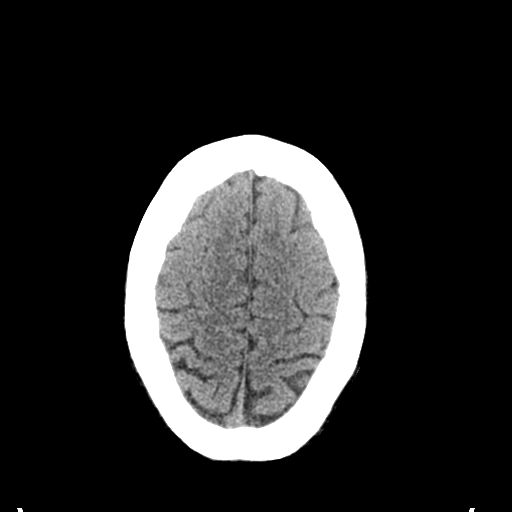
[im 30/37  brain]
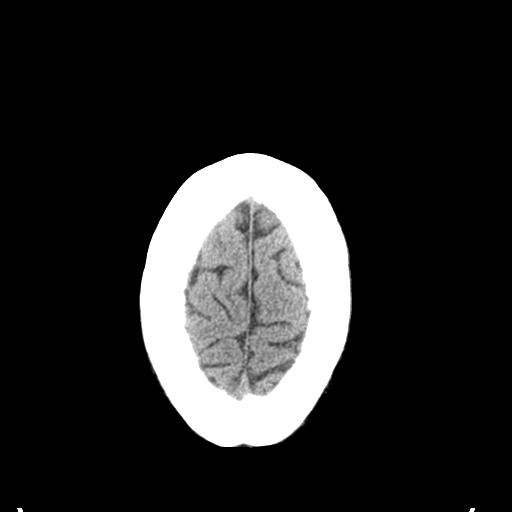
[im 30/37  bone]
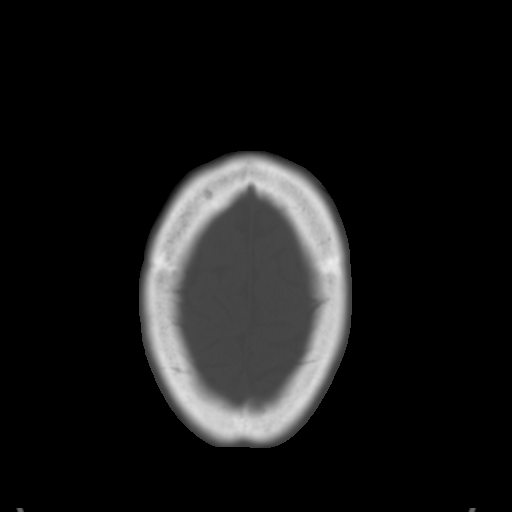
[im 34/37  brain]
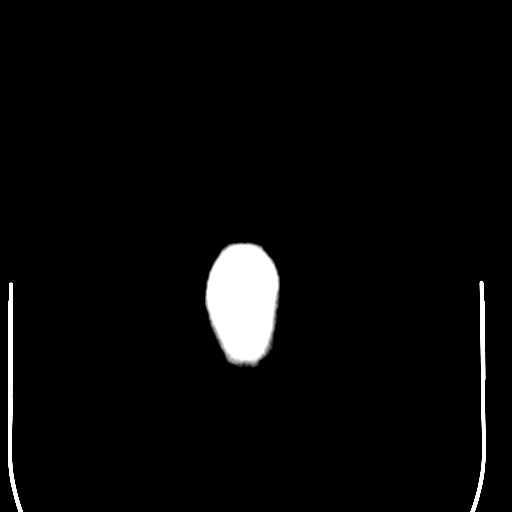

[Series 5: coronal soft tissue · coronal · 0.35mm/px · 3 of 76 slices shown]
[im 26/76  brain]
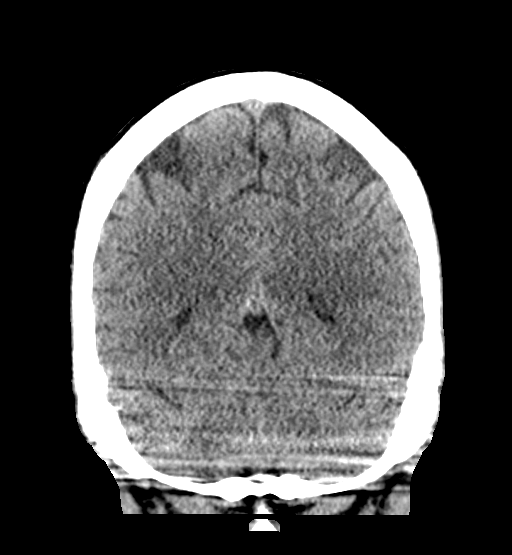
[im 34/76  brain]
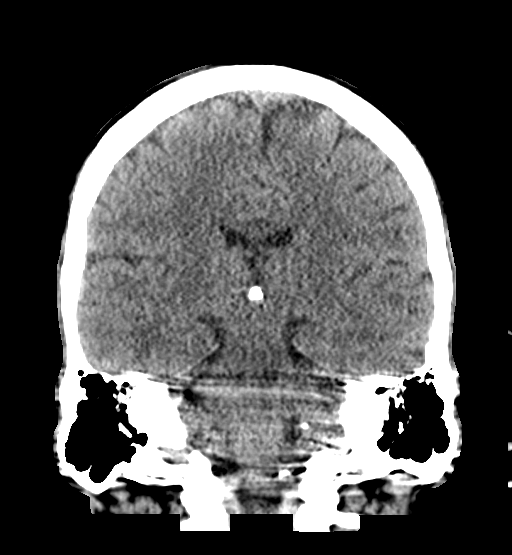
[im 42/76  brain]
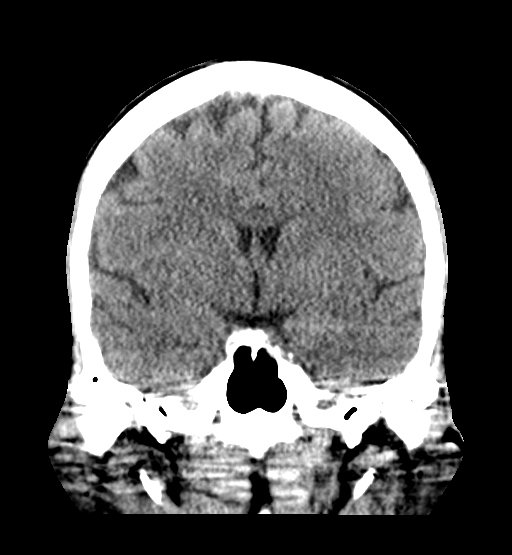

[Series 6: sagittal soft tissue · sagittal · 0.38mm/px · 3 of 56 slices shown]
[im 19/56  brain]
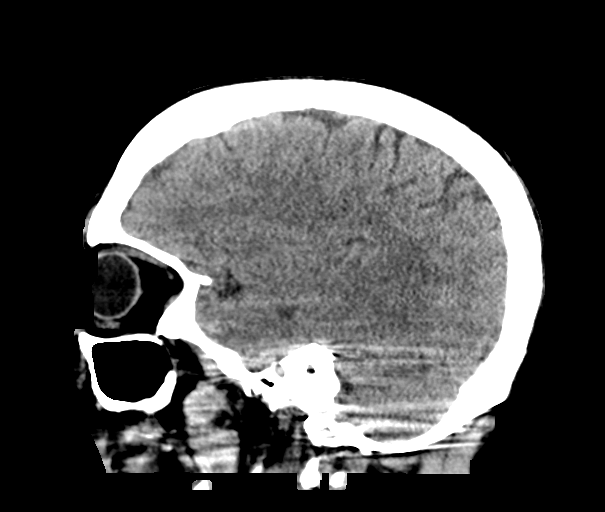
[im 28/56  brain]
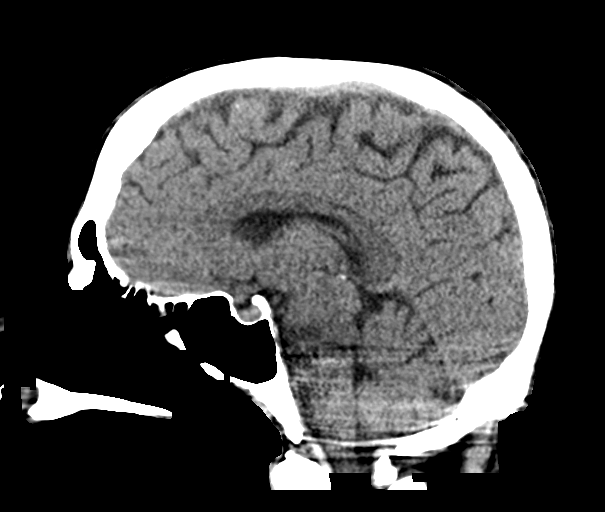
[im 37/56  brain]
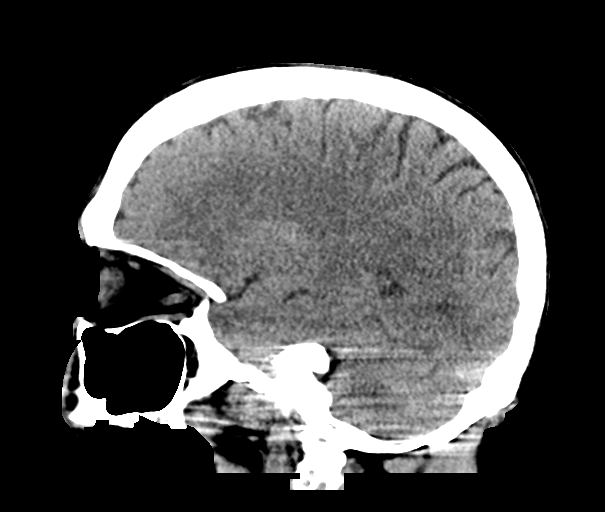

[16 of 47 positions shown; findings below may reference images not displayed]

FINDINGS: Brain: No evidence of acute infarction, hemorrhage, hydrocephalus,
extra-axial collection or mass lesion/mass effect.

Vascular: There are interval increased calcifications in the distal
left vertebral artery and both siphons but no hyperdense central
vasculature.

Skull: Normal. Negative for fracture or focal lesion.

Sinuses/Orbits: No acute finding.

Other: None.
IMPRESSION: No acute intracranial CT findings. The only significant interval
change since 6423 is that the vascular calcifications are greater in
the distal left vertebral artery and siphons.

## 2021-11-29 IMAGING — CR DG CHEST 2V
3 series · 3 of 3 positions shown · non-contrast
Comparison: Chest x-ray 04/13/2021.

CLINICAL DATA: 45-year-old female with history of productive cough
and hematemesis.

EXAM:
CHEST - 2 VIEW

[w chest lat]
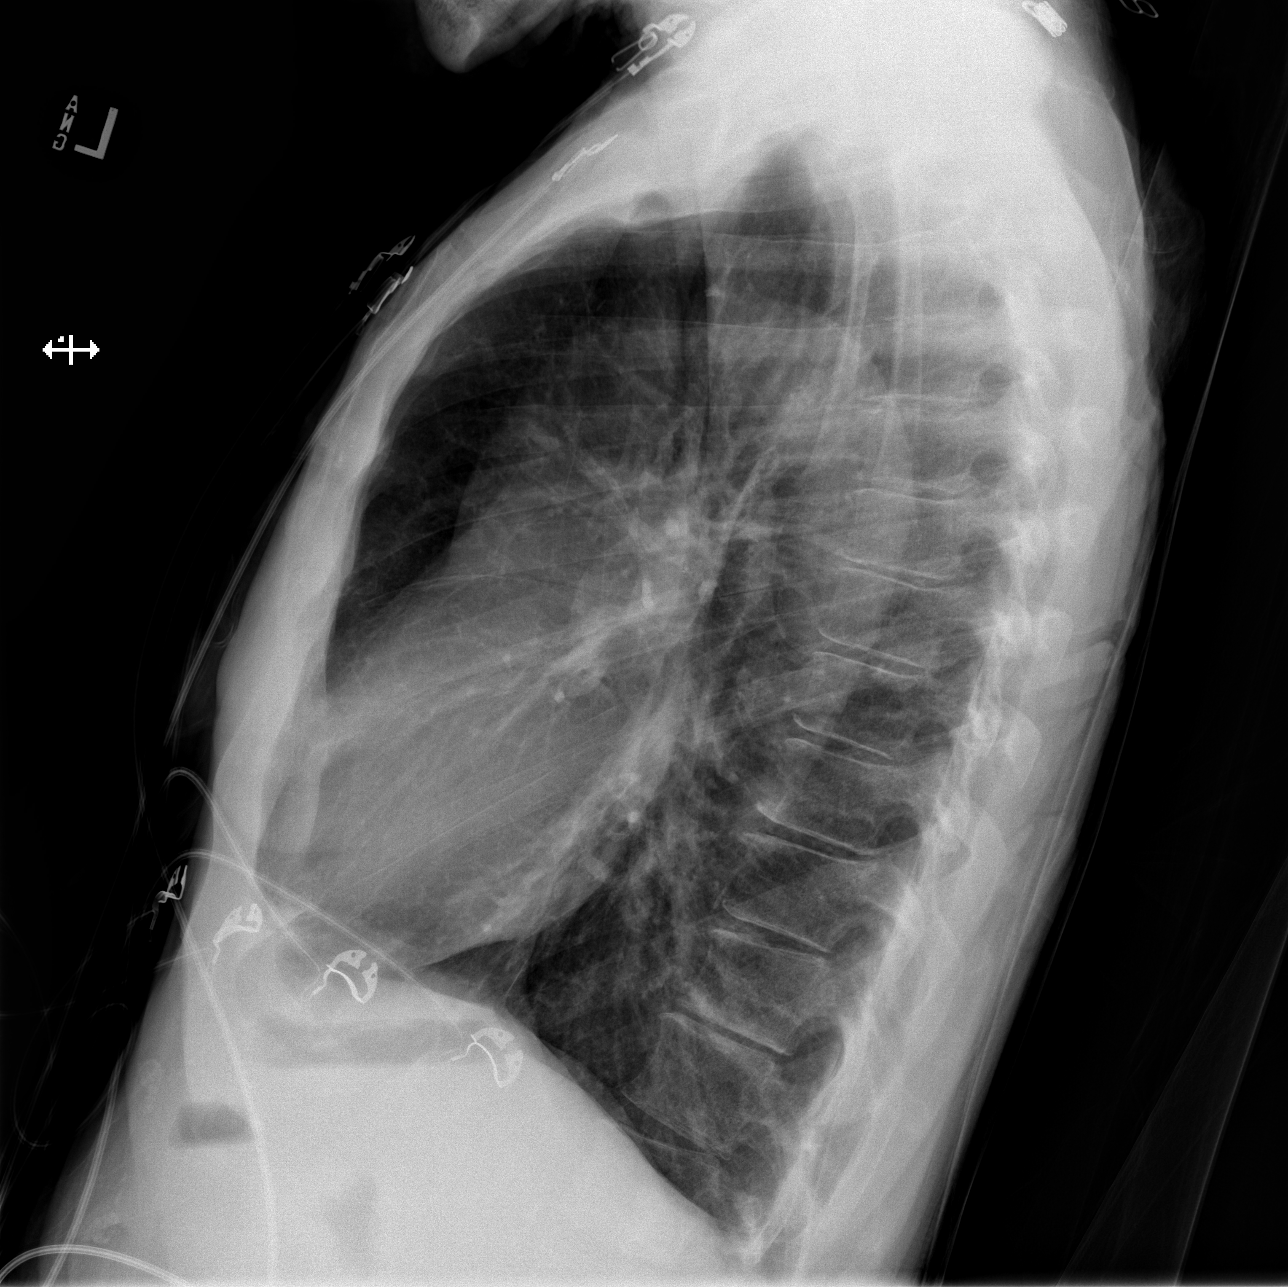

[x chest ap (1 of 2)]
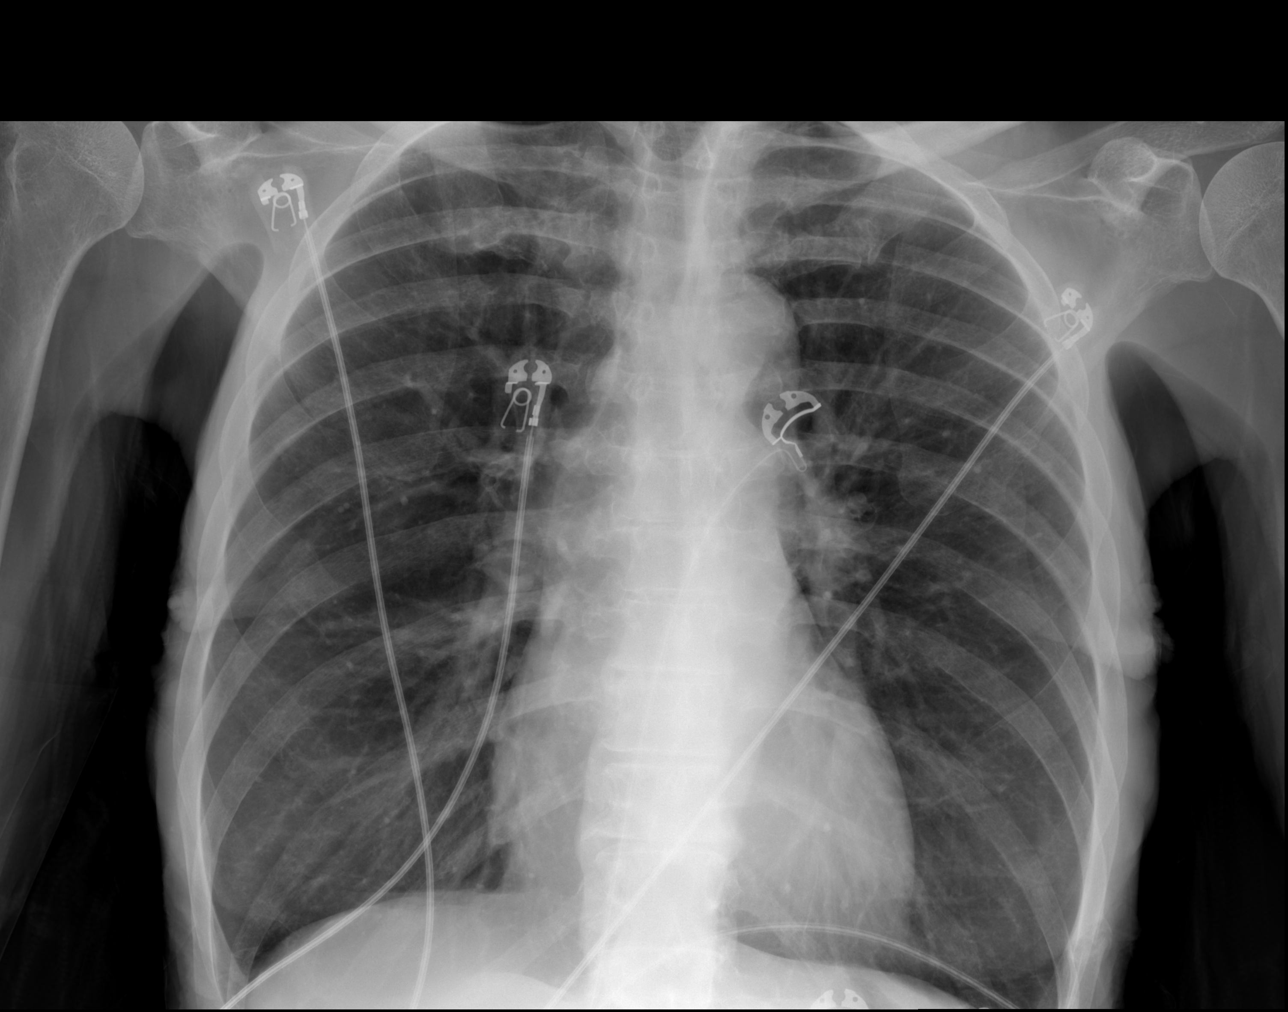

[x chest ap (2 of 2)]
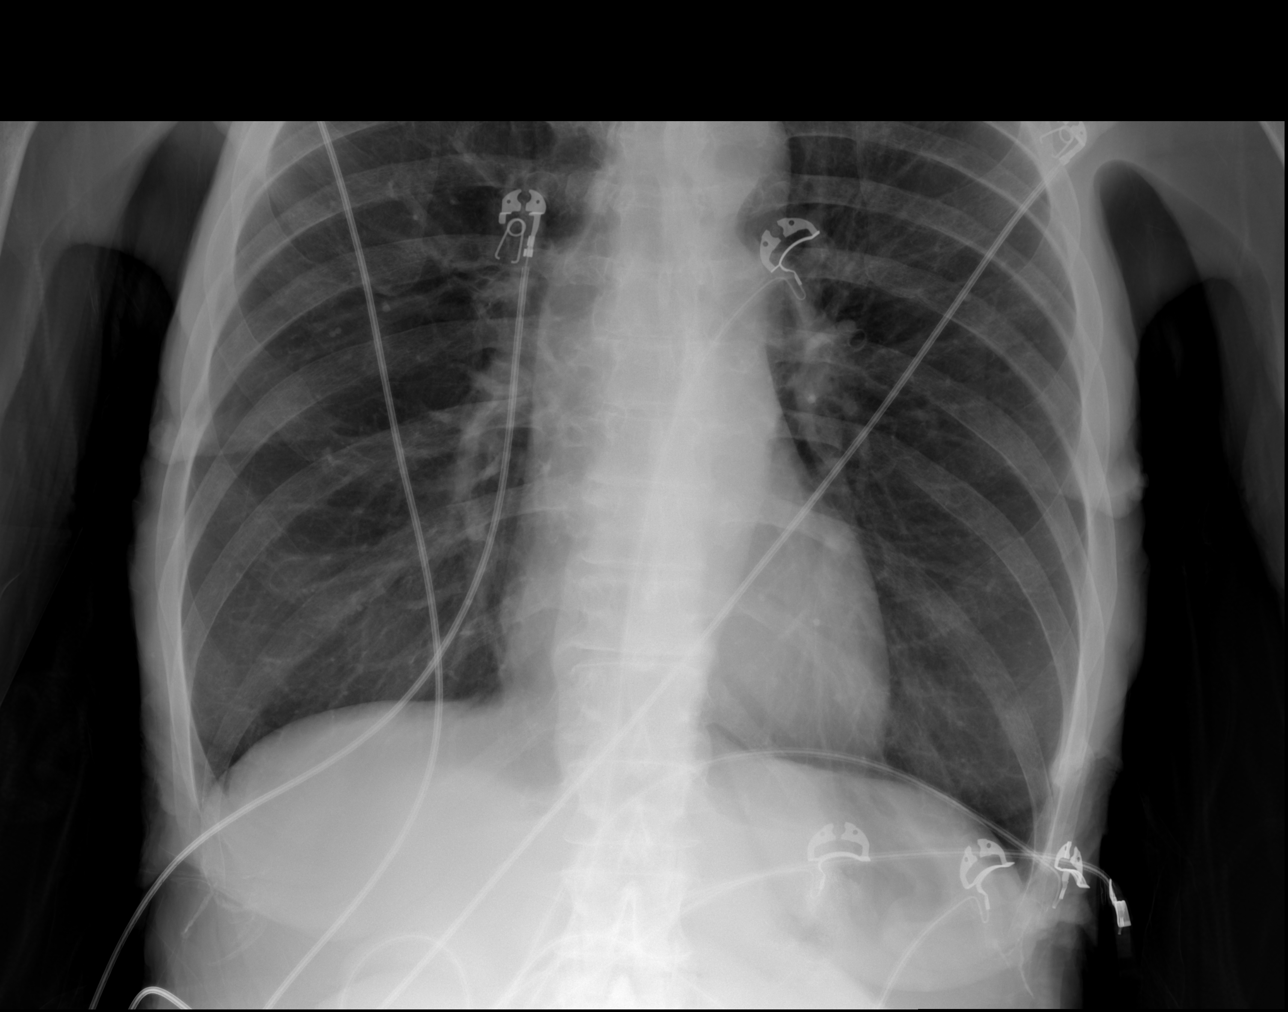

[3 of 3 positions shown; findings below may reference images not displayed]

FINDINGS: Lung volumes are increased with emphysematous changes. No
consolidative airspace disease. No pleural effusions. No
pneumothorax. No pulmonary nodule or mass noted. Pulmonary
vasculature and the cardiomediastinal silhouette are within normal
limits.
IMPRESSION: 1. No radiographic evidence of acute cardiopulmonary disease.
2. Emphysema.

## 2021-11-29 IMAGING — CT CT ABD-PELV W/ CM
2 of 7 series · 16 of 46 positions shown, 18 images · IV contrast (omnipaque)
Comparison: 03/15/2021

CLINICAL DATA: Diffuse abdominal pain with nausea vomiting.

EXAM:
CT ABDOMEN AND PELVIS WITH CONTRAST
TECHNIQUE: Multidetector CT imaging of the abdomen and pelvis was performed
using the standard protocol following bolus administration of
intravenous contrast.
CONTRAST:  80mL OMNIPAQUE IOHEXOL 350 MG/ML SOLN

[Series 4: coronal st · coronal · 0.70mm/px · 3 of 123 slices shown]
[im 41/123  soft-tissue]
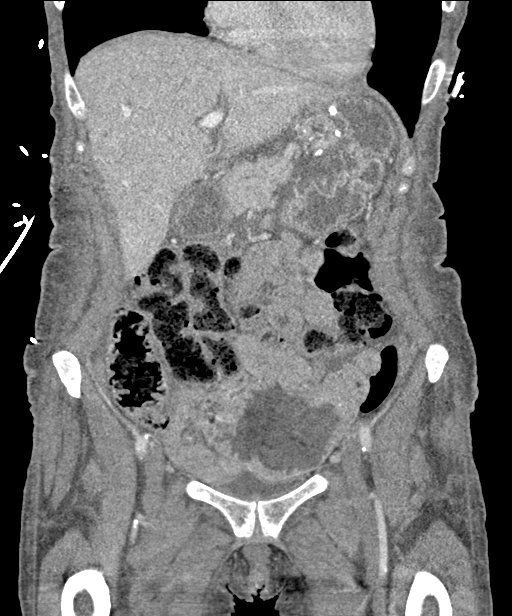
[im 55/123  soft-tissue]
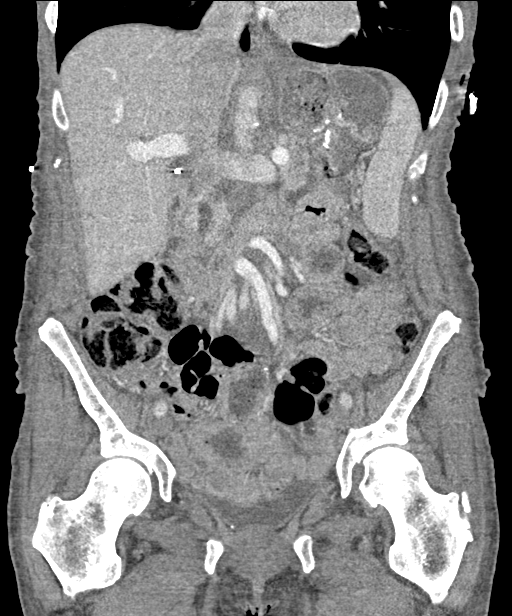
[im 68/123  soft-tissue]
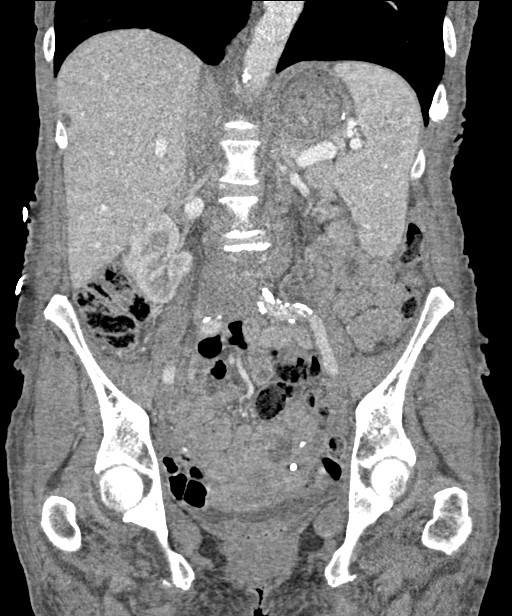

[Series 9: thins · axial · 0.72mm/px · z∈[+1106,+1483]mm · 13 of 592 slices shown, 15 images]
[im 27/592  soft-tissue]
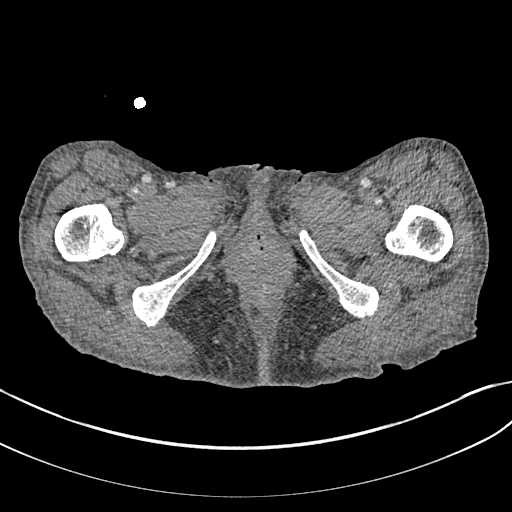
[im 27/592  bone]
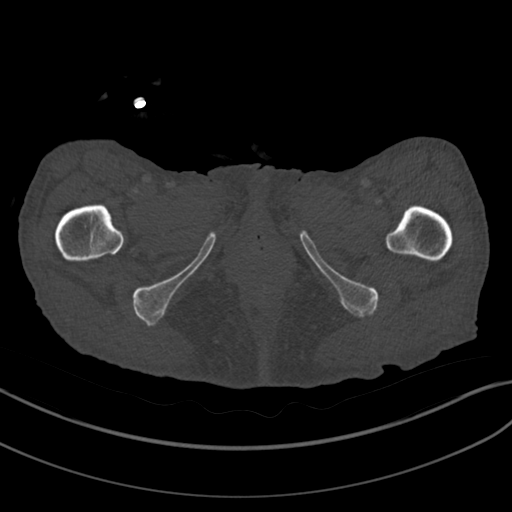
[im 81/592  soft-tissue]
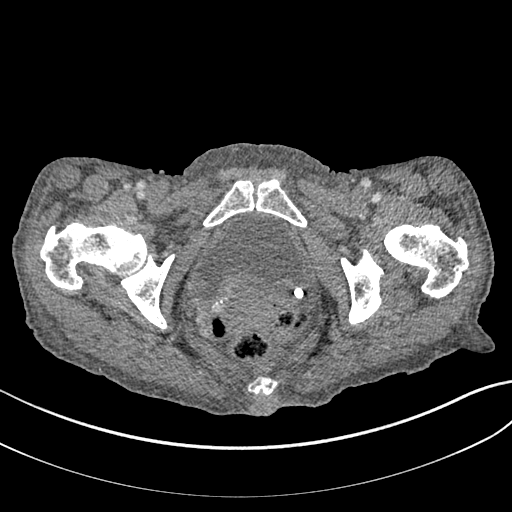
[im 135/592  soft-tissue]
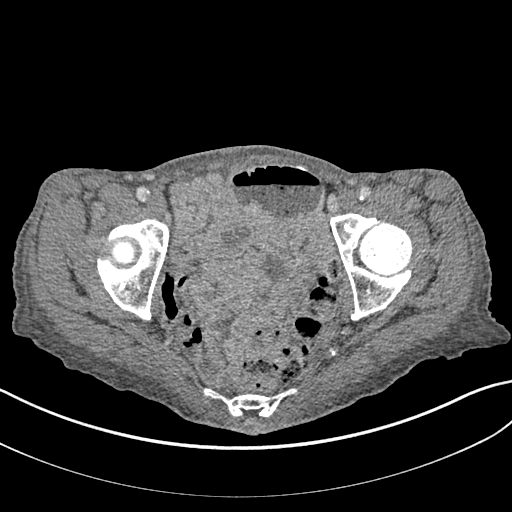
[im 162/592  soft-tissue]
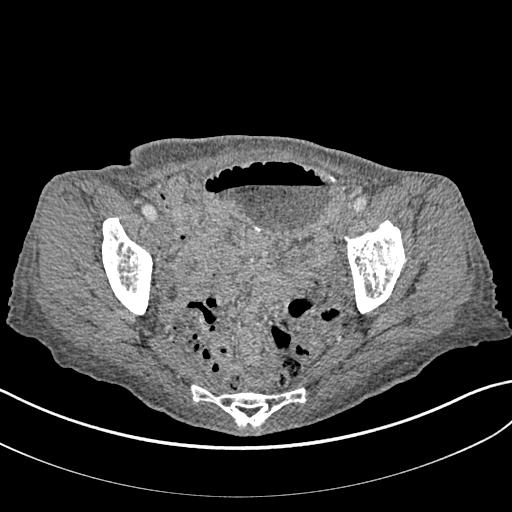
[im 215/592  soft-tissue]
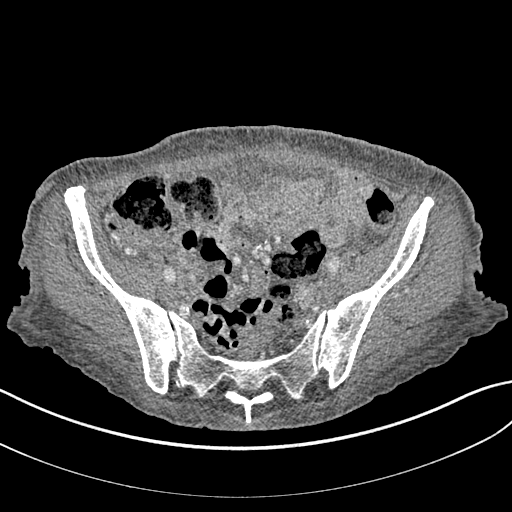
[im 242/592  soft-tissue]
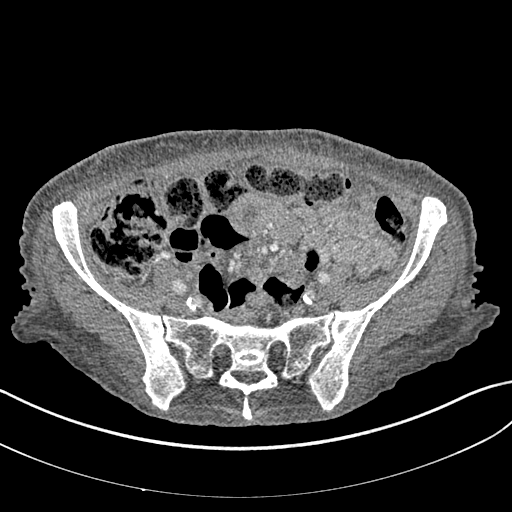
[im 296/592  soft-tissue]
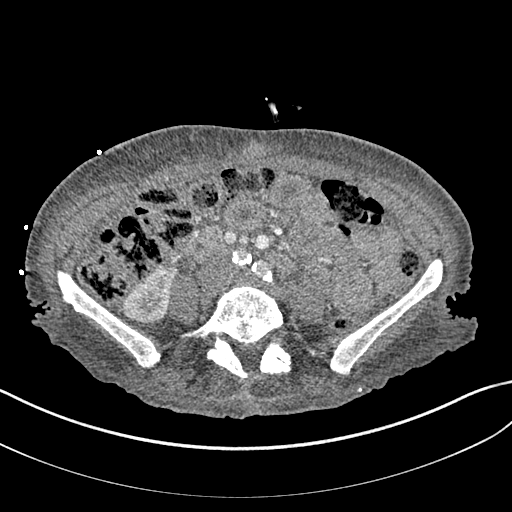
[im 350/592  soft-tissue]
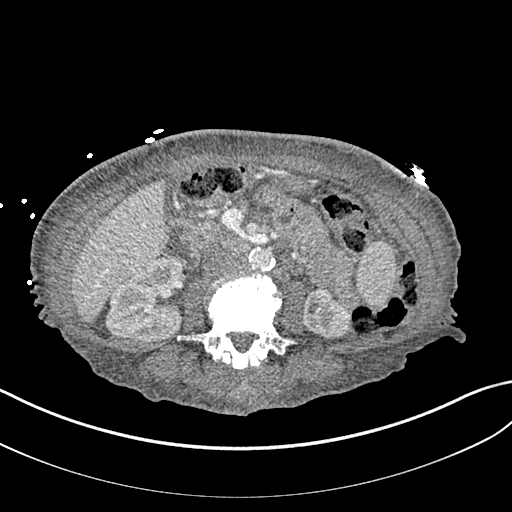
[im 377/592  soft-tissue]
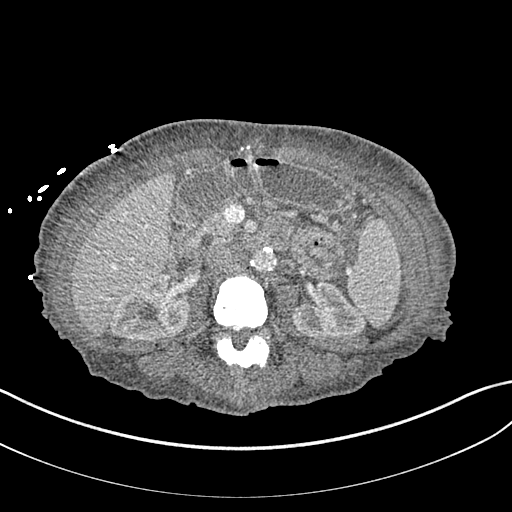
[im 377/592  bone]
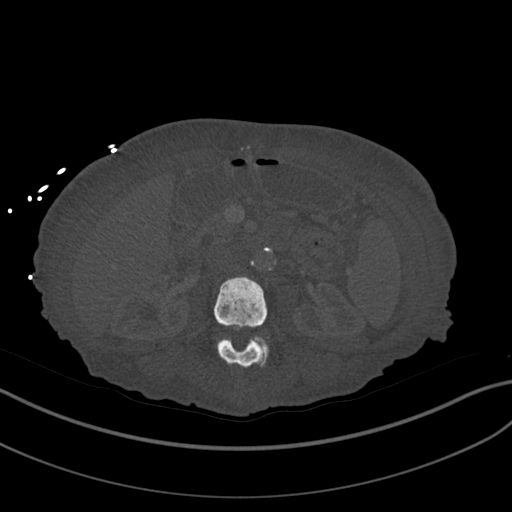
[im 430/592  soft-tissue]
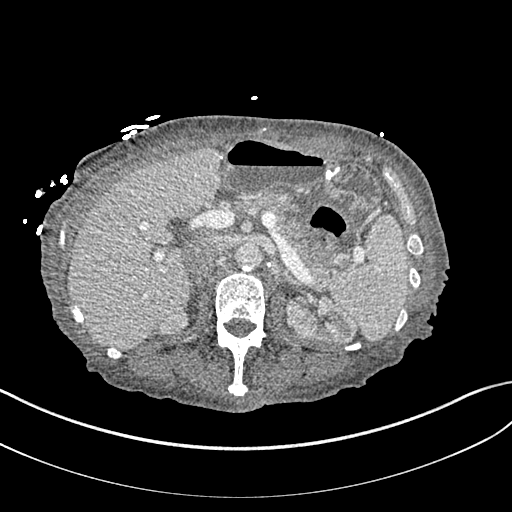
[im 457/592  soft-tissue]
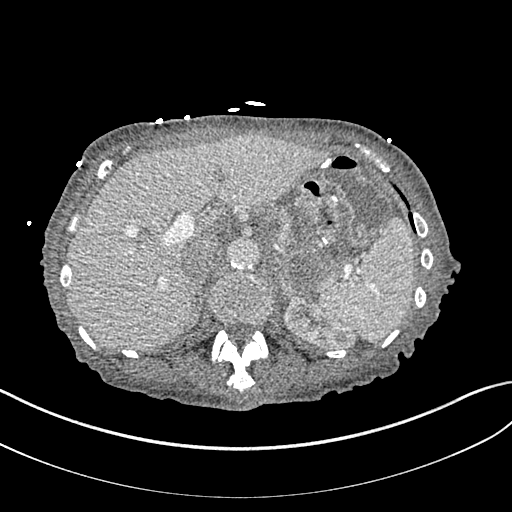
[im 511/592  soft-tissue]
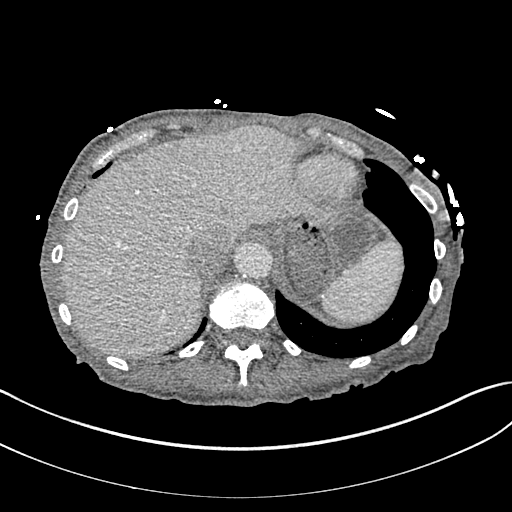
[im 565/592  soft-tissue]
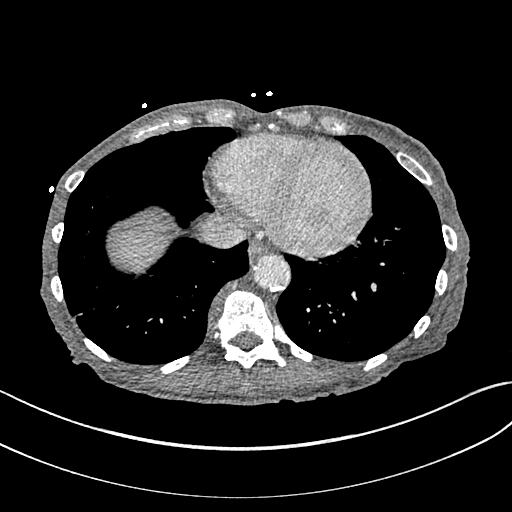

[16 of 46 positions shown; findings below may reference images not displayed]

FINDINGS: Lower chest: No acute abnormality.

Hepatobiliary: Liver is normal in size and overall attenuation. 10
mm low-density lesion in the peripheral aspect of the right lobe
consistent with a cyst and unchanged. No other liver masses or
lesions. Status post cholecystectomy. No bile duct dilation.

Pancreas: Unremarkable. No pancreatic ductal dilatation or
surrounding inflammatory changes.

Spleen: Top normal in size, 13 cm in longest dimension. No splenic
mass or focal lesion and no change.

Adrenals/Urinary Tract: No adrenal masses.

Mild, left greater than right, bilateral renal cortical thinning. No
renal masses or stones. Symmetric renal enhancement and excretion.
Ureters are normal in course and in caliber. Bladder is
unremarkable.

Stomach/Bowel: Status post gastric bypass surgery no bowel dilation
to suggest obstruction. No convincing wall thickening or adjacent
inflammation.

Diffuse increased attenuation throughout the mesentery consistent
with edema, similar to the prior study.

Vascular/Lymphatic: Dense aortic atherosclerosis. No aneurysm. No
enlarged lymph nodes.

Reproductive: Uterus not well-defined.  No adnexal masses.

Other: No ascites.  Diffuse subcutaneous edema.

Musculoskeletal: No fracture or acute finding. No osteoblastic or
osteolytic lesions.
IMPRESSION: 1. No definite acute abnormality within the abdomen or pelvis. Study
is limited by the patient's limited peritoneal fat and generalized
increased attenuation within the peritoneal fat limiting soft tissue
contrast. Allowing for this, there is no evidence bowel obstruction
or bowel inflammation. There are stable changes from previous
gastric bypass surgery.
2. Prominent aortic atherosclerosis, also unchanged.

## 2021-12-02 IMAGING — CR DG ABDOMEN ACUTE W/ 1V CHEST
3 series · 3 of 3 positions shown · non-contrast
Comparison: CT Abdomen and Pelvis 04/18/2021 and earlier.

CLINICAL DATA: 45-year-old female with abdominal pain. Hematemesis.

EXAM:
DG ABDOMEN ACUTE WITH 1 VIEW CHEST

[w chest pa]
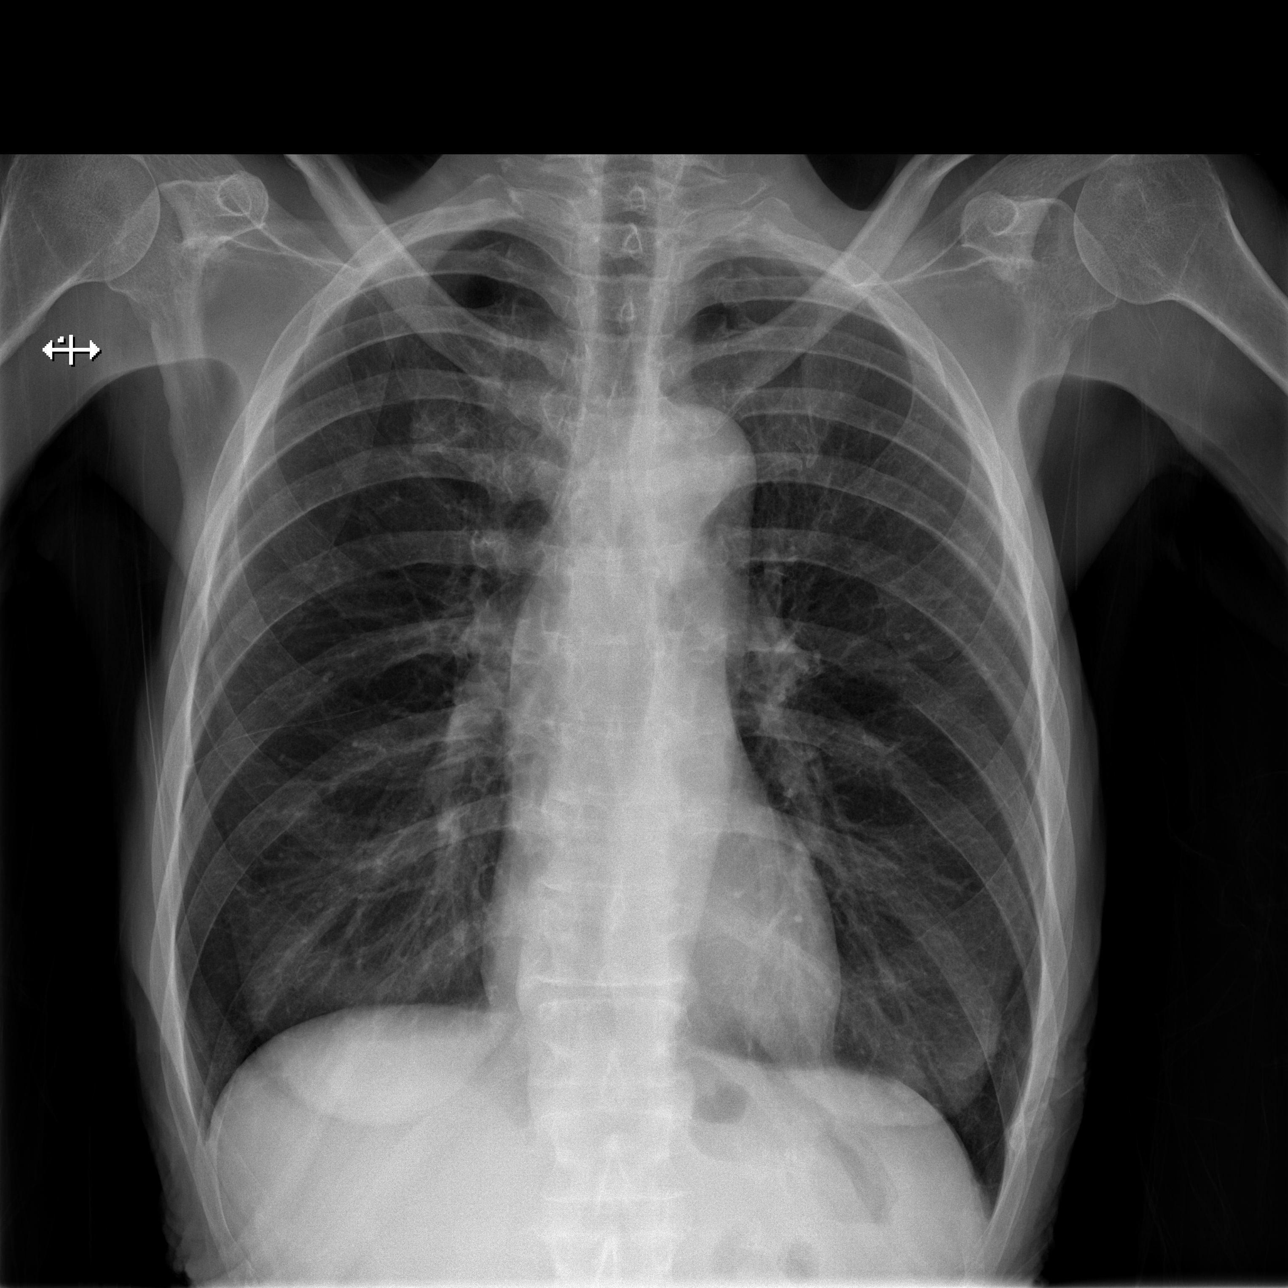

[w abdomen upright]
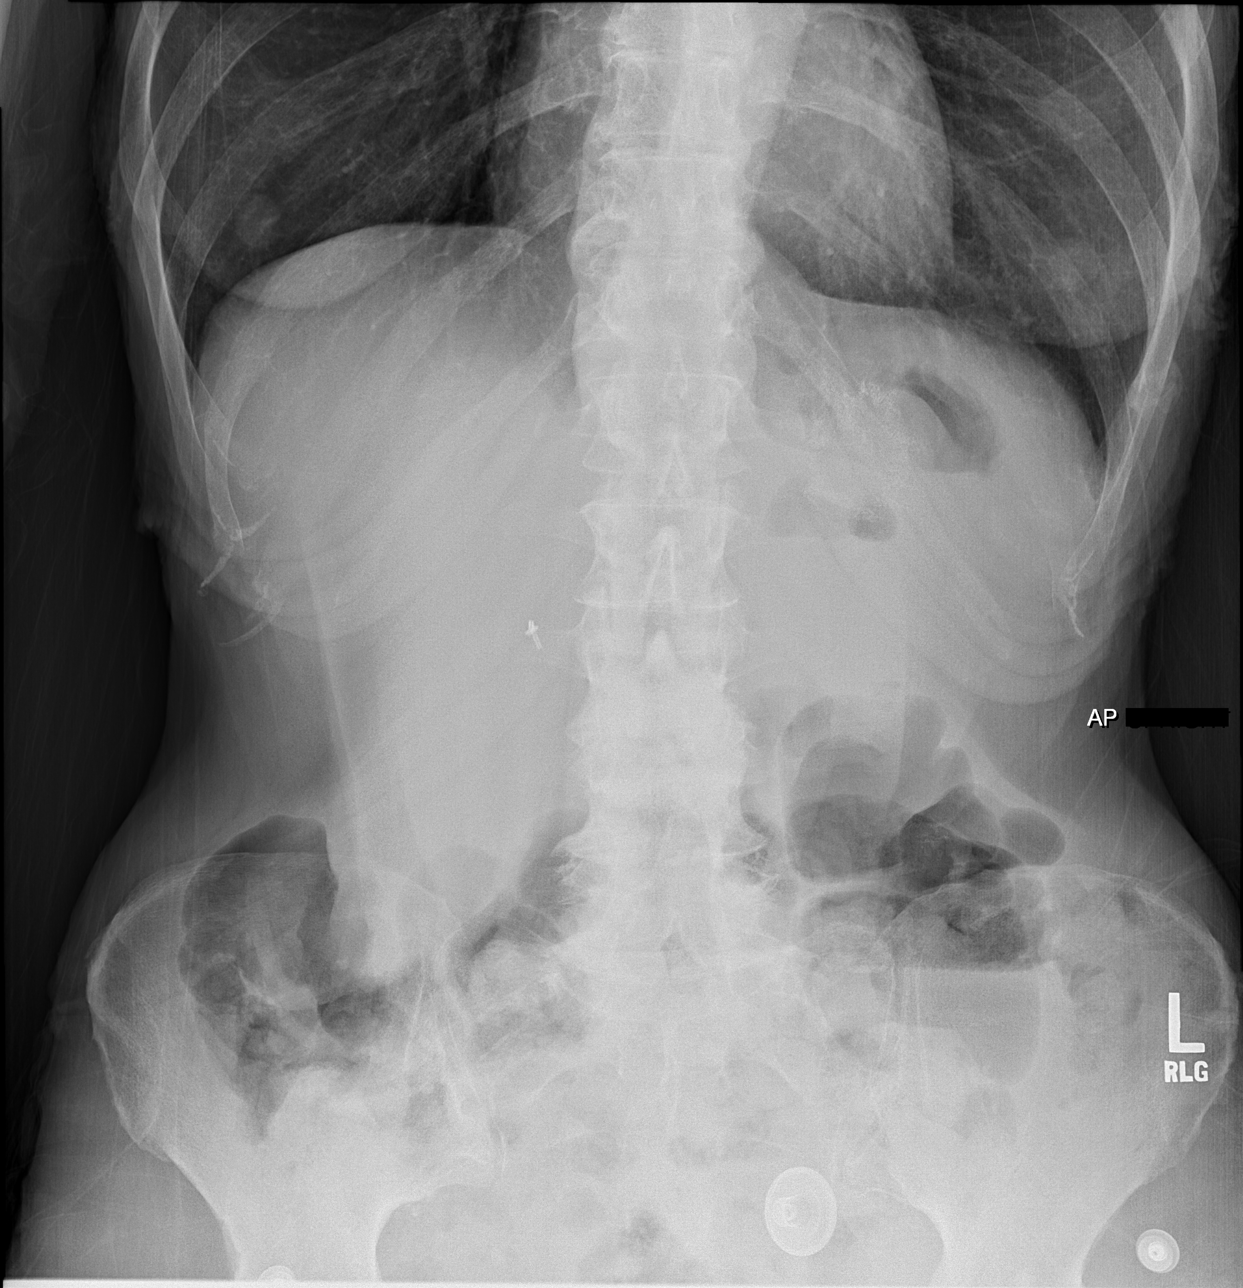

[t abdomen supine]
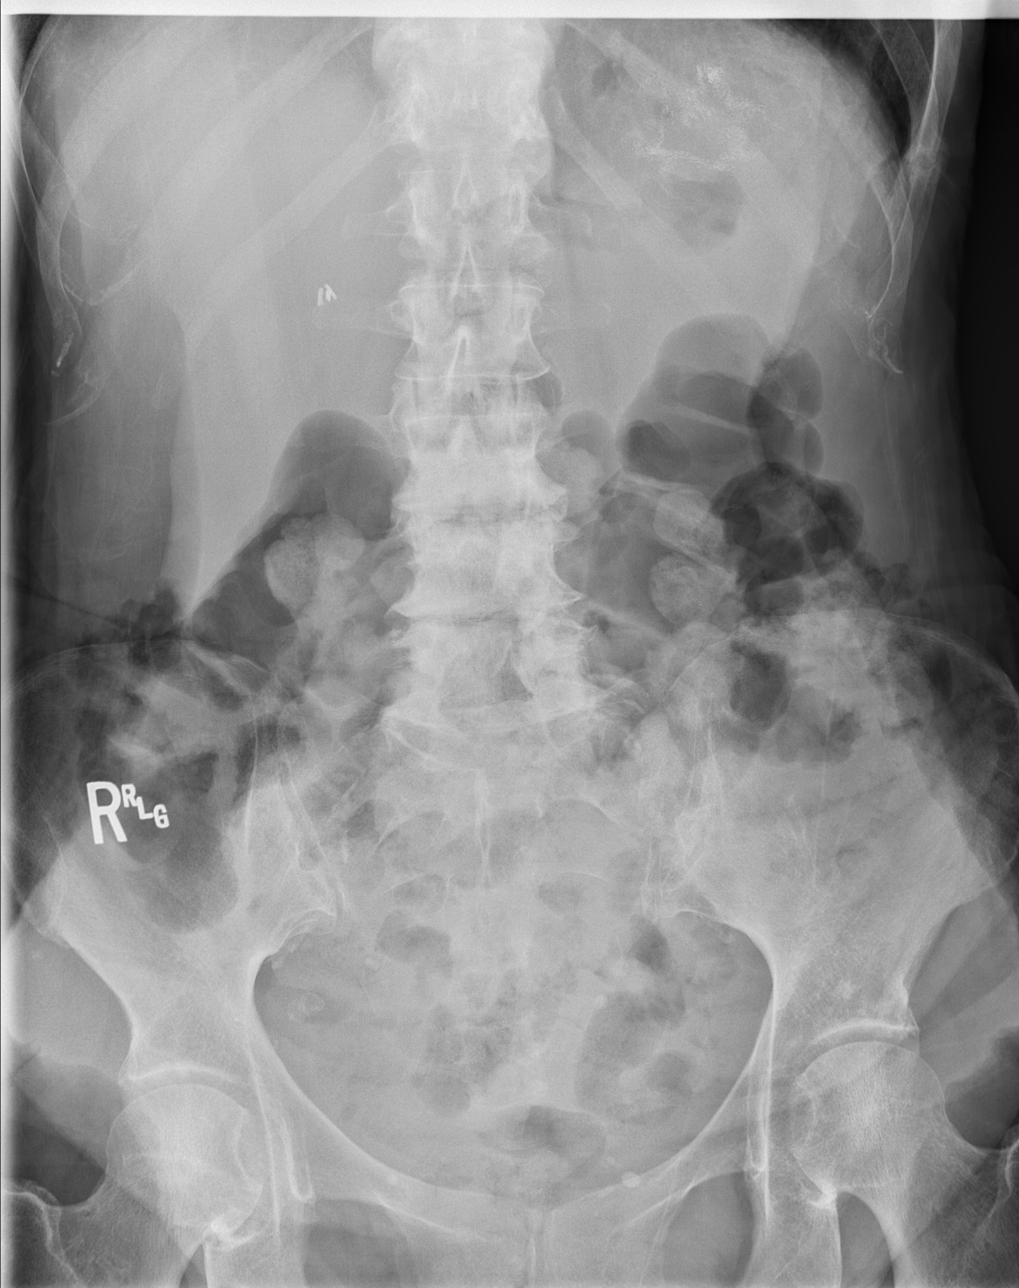

[3 of 3 positions shown; findings below may reference images not displayed]

FINDINGS: Larger lung volumes compared to 5775 chest radiographs. Normal
cardiac size and mediastinal contours. Visualized tracheal air
column is within normal limits. Both lungs appear clear. No
pneumothorax or pneumoperitoneum.

Upright and supine views of the abdomen and pelvis. Stable
cholecystectomy clips. Bowel staple line in the left upper quadrant.
Non obstructed bowel gas pattern. Pelvic phleboliths. Other
abdominal and pelvic visceral contours are within normal limits. No
acute osseous abnormality identified. Lumbar disc and endplate
degeneration.
IMPRESSION: 1. Normal bowel gas pattern, no free air.
2. No acute cardiopulmonary abnormality.

## 2021-12-17 ENCOUNTER — Emergency Department (HOSPITAL_COMMUNITY): Payer: Medicaid Other

## 2021-12-17 ENCOUNTER — Emergency Department (HOSPITAL_COMMUNITY)
Admission: EM | Admit: 2021-12-17 | Discharge: 2021-12-18 | Disposition: A | Discharge status: Rehabilitation Facility | Payer: Medicaid Other | Attending: Emergency Medicine | Admitting: Emergency Medicine

## 2021-12-17 ENCOUNTER — Other Ambulatory Visit: Payer: Self-pay

## 2021-12-17 DIAGNOSIS — I10 Essential (primary) hypertension: Secondary | ICD-10-CM | POA: Diagnosis not present

## 2021-12-17 DIAGNOSIS — R079 Chest pain, unspecified: Secondary | ICD-10-CM

## 2021-12-17 LAB — CBC WITH DIFFERENTIAL/PLATELET
Abs Immature Granulocytes: 0.02 10*3/uL (ref 0.00–0.07)
Basophils Absolute: 0 10*3/uL (ref 0.0–0.1)
Basophils Relative: 1 %
Eosinophils Absolute: 0.1 10*3/uL (ref 0.0–0.5)
Eosinophils Relative: 1 %
HCT: 33.9 % — ABNORMAL LOW (ref 36.0–46.0)
Hemoglobin: 11.9 g/dL — ABNORMAL LOW (ref 12.0–15.0)
Immature Granulocytes: 0 %
Lymphocytes Relative: 24 %
Lymphs Abs: 1.8 10*3/uL (ref 0.7–4.0)
MCH: 29.4 pg (ref 26.0–34.0)
MCHC: 35.1 g/dL (ref 30.0–36.0)
MCV: 83.7 fL (ref 80.0–100.0)
Monocytes Absolute: 0.5 10*3/uL (ref 0.1–1.0)
Monocytes Relative: 6 %
Neutro Abs: 5.2 10*3/uL (ref 1.7–7.7)
Neutrophils Relative %: 68 %
Platelets: 220 10*3/uL (ref 150–400)
RBC: 4.05 MIL/uL (ref 3.87–5.11)
RDW: 16.3 % — ABNORMAL HIGH (ref 11.5–15.5)
WBC: 7.7 10*3/uL (ref 4.0–10.5)
nRBC: 0 % (ref 0.0–0.2)

## 2021-12-17 LAB — URINALYSIS, ROUTINE W REFLEX MICROSCOPIC
Bilirubin Urine: NEGATIVE
Glucose, UA: NEGATIVE mg/dL
Hgb urine dipstick: NEGATIVE
Ketones, ur: NEGATIVE mg/dL
Nitrite: POSITIVE — AB
Protein, ur: NEGATIVE mg/dL
Specific Gravity, Urine: 1.005 (ref 1.005–1.030)
pH: 7 (ref 5.0–8.0)

## 2021-12-17 LAB — I-STAT BETA HCG BLOOD, ED (MC, WL, AP ONLY): I-stat hCG, quantitative: <5 m[IU]/mL (ref <5)

## 2021-12-17 LAB — COMPREHENSIVE METABOLIC PANEL
ALT: 29 U/L (ref 0–44)
AST: 18 U/L (ref 15–41)
Albumin: 3 g/dL — ABNORMAL LOW (ref 3.5–5.0)
Alkaline Phosphatase: 94 U/L (ref 38–126)
Anion gap: 8 (ref 5–15)
BUN: 19 mg/dL (ref 6–20)
CO2: 23 mmol/L (ref 22–32)
Calcium: 9.3 mg/dL (ref 8.9–10.3)
Chloride: 103 mmol/L (ref 98–111)
Creatinine, Ser: 0.51 mg/dL (ref 0.44–1.00)
GFR, Estimated: >60 mL/min (ref >60)
Glucose, Bld: 89 mg/dL (ref 70–99)
Potassium: 4.3 mmol/L (ref 3.5–5.1)
Sodium: 134 mmol/L — ABNORMAL LOW (ref 135–145)
Total Bilirubin: 0.5 mg/dL (ref 0.3–1.2)
Total Protein: 6.3 g/dL — ABNORMAL LOW (ref 6.5–8.1)

## 2021-12-17 LAB — TROPONIN I (HIGH SENSITIVITY)
Troponin I (High Sensitivity): 11 ng/L (ref <18)
Troponin I (High Sensitivity): 10 ng/L (ref <18)

## 2021-12-17 MED ORDER — IOHEXOL 350 MG/ML SOLN
80.0000 mL | Freq: Once | INTRAVENOUS | Status: AC | PRN
  Administered 2021-12-17: 80 mL via INTRAVENOUS

## 2021-12-17 MED ORDER — MORPHINE SULFATE (PF) 2 MG/ML IV SOLN
2.0000 mg | Freq: Once | INTRAVENOUS | Status: AC
  Administered 2021-12-17: 2 mg via INTRAVENOUS
  Filled 2021-12-17: qty 1

## 2021-12-17 MED ORDER — LABETALOL HCL 5 MG/ML IV SOLN
10.0000 mg | Freq: Once | INTRAVENOUS | Status: AC
  Administered 2021-12-17: 10 mg via INTRAVENOUS
  Filled 2021-12-17: qty 4

## 2021-12-17 MED ORDER — MORPHINE SULFATE (PF) 4 MG/ML IV SOLN
4.0000 mg | Freq: Once | INTRAVENOUS | Status: AC
  Administered 2021-12-17: 4 mg via INTRAVENOUS
  Filled 2021-12-17: qty 1

## 2021-12-17 MED ORDER — SODIUM CHLORIDE 0.9 % IV SOLN
1.0000 g | Freq: Once | INTRAVENOUS | Status: AC
  Administered 2021-12-18: 1 g via INTRAVENOUS
  Filled 2021-12-17: qty 10

## 2021-12-17 NOTE — ED Provider Notes (Signed)
[facility] EMERGENCY DEPARTMENT Provider Note   CSN: [id number] Arrival date & time: 12/17/21  1810     History  Chief Complaint  Patient presents with   Chest Pain    Terri [name] is a 46 y.o. female.   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  Per EMS patient is from [place] and started having right-sided chest pain 2 days ago which radiated to her shoulder.  Her pain is worse with inspiration and movement.  She denies any recent long travel.  Patient denies any associated diaphoresis, nausea, shortness of breath.  She denies any abdominal pain.  She states her bowel movements have been normal and denies any recent episodes of emesis.  She denies any dysuria or urinary frequency/urgency.  She reports that she was in the emergency department in early June and had similar kinds of chest pain at that time, though this current chest pain feels different as she describes it as feeling as though it is tearing through her back.  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  She has since completed 6 weeks of antibiotic therapy.     Home Medications Prior to Admission medications   Medication Sig Start Date End Date Taking? Authorizing Provider  calcium carbonate (TUMS - DOSED IN MG ELEMENTAL CALCIUM) 500 MG chewable tablet Chew 1 tablet (200 mg of elemental calcium total) by mouth 3 (three) times daily. 10/12/21   Amin, Jeanella Flattery, MD  cloNIDine (CATAPRES - DOSED IN MG/24 HR) 0.1 mg/24hr patch Place 1 patch (0.1 mg total) onto the skin once a week. 10/16/21   Amin, Jeanella Flattery, MD  cyclobenzaprine (FLEXERIL) 5 MG tablet Take 1  tablet (5 mg total) by mouth 3 (three) times daily as needed for muscle spasms. 10/12/21   Amin, Jeanella Flattery, MD  dronabinol (MARINOL) 5 MG capsule Take 1 capsule (5 mg total) by mouth 2 (two) times daily before lunch and supper. 10/12/21   Amin, Jeanella Flattery, MD  folic acid (FOLVITE) 1 MG tablet Take 1 tablet (1 mg total) by mouth daily. 10/13/21   Amin, Jeanella Flattery, MD  HYDROcodone-acetaminophen (NORCO) 7.5-325 MG tablet Take 1 tablet by mouth every 6 (six) hours as needed for moderate pain or severe pain. 10/12/21   Amin, Jeanella Flattery, MD  lisinopril (ZESTRIL) 10 MG tablet Take 1 tablet (10 mg total) by mouth at bedtime. 10/12/21   Amin, Jeanella Flattery, MD  pantoprazole (PROTONIX) 40 MG tablet Take 1 tablet (40 mg total) by mouth daily before breakfast. 10/12/21   Amin, Jeanella Flattery, MD  thiamine 100 MG tablet Take 1 tablet (100 mg total) by mouth daily. 10/13/21   Damita Lack, MD      Allergies    Nsaids and Ketorolac tromethamine    Review of Systems   Review of Systems  Cardiovascular:  Positive for chest pain.    Physical Exam Updated Vital Signs BP (!) 179/112   Pulse 66   Temp 98.3 F (36.8 C) (Oral)   Resp 15   Ht '5\' 9"'$  (1.753 m)   Wt 48 kg   LMP  (LMP Unknown)   SpO2 100%   BMI 15.63 kg/m  Physical Exam Vitals and nursing note reviewed.  Constitutional:      General: She is not in acute distress.    Appearance: She is well-developed.     Comments: Chronically ill-appearing  HENT:     Head: Atraumatic.  Eyes:     Extraocular Movements: Extraocular movements intact.     Conjunctiva/sclera: Conjunctivae normal.     Pupils: Pupils are equal, round, and reactive to light.  Neck:     Vascular: No JVD.  Cardiovascular:     Rate and Rhythm: Normal rate and regular rhythm.     Pulses:          Carotid pulses are 2+ on the right side and 2+ on the left side.      Radial pulses are 2+ on the right side and 2+ on the left side.       Dorsalis pedis pulses are 2+ on the  right side and 2+ on the left side.       Posterior tibial pulses are 2+ on the right side and 2+ on the left side.     Heart sounds: No murmur heard. Pulmonary:     Effort: Pulmonary effort is normal. No respiratory distress.     Breath sounds: Normal breath sounds. No decreased breath sounds, wheezing, rhonchi or rales.  Abdominal:     Palpations: Abdomen is soft.     Tenderness: There is no abdominal tenderness.  Musculoskeletal:        General: No swelling.     Cervical back: Neck supple.     Right lower leg: No tenderness. No edema.     Left lower leg: No tenderness. No edema.  Skin:    General: Skin is warm and dry.     Capillary Refill: Capillary refill takes less than 2 seconds.     Findings: No rash.  Neurological:     General: No focal deficit present.     Mental Status: She is alert and oriented to person, place, and time.  Psychiatric:        Mood and Affect: Mood normal.     ED Results / Procedures / Treatments   Labs (all labs ordered are listed, but only abnormal results are displayed) Labs Reviewed  CBC WITH DIFFERENTIAL/PLATELET - Abnormal; Notable for the following components:      Result Value   Hemoglobin 11.9 (*)    HCT 33.9 (*)    RDW 16.3 (*)    All other components within normal limits  COMPREHENSIVE METABOLIC PANEL - Abnormal; Notable for the following components:   Sodium 134 (*)    Total Protein 6.3 (*)    Albumin 3.0 (*)    All other components within normal limits  URINALYSIS, ROUTINE W REFLEX MICROSCOPIC - Abnormal; Notable for the following components:   APPearance HAZY (*)    Nitrite POSITIVE (*)    Leukocytes,Ua TRACE (*)    Bacteria, UA RARE (*)    All other components within normal limits  URINE CULTURE  I-STAT BETA HCG BLOOD, ED ([facility], [facility], [facility] ONLY)  TROPONIN I (HIGH SENSITIVITY)  TROPONIN I (HIGH SENSITIVITY)    EKG None  Radiology CT Angio Chest/Abd/Pel for Dissection W and/or Wo Contrast  Result Date: 12/17/2021 CLINICAL  DATA:  Acute aortic syndrome (AAS) suspected, chest pain EXAM: CT ANGIOGRAPHY CHEST, ABDOMEN AND PELVIS TECHNIQUE: Non-contrast CT of the chest was initially obtained. Multidetector CT imaging through the chest, abdomen and pelvis was performed using the standard protocol during  bolus administration of intravenous contrast. Multiplanar reconstructed images and MIPs were obtained and reviewed to evaluate the vascular anatomy. RADIATION DOSE REDUCTION: This exam was performed according to the departmental dose-optimization program which includes automated exposure control, adjustment of the mA and/or kV according to patient size and/or use of iterative reconstruction technique. CONTRAST:  44m OMNIPAQUE IOHEXOL 350 MG/ML SOLN COMPARISON:  Chest x-ray 12/17/2021, CT abdomen pelvis 11/02/2021, CT angiography chest 05/30/2021 FINDINGS: CTA CHEST FINDINGS Cardiovascular: Preferential opacification of the thoracic aorta. No evidence of thoracic aortic aneurysm or dissection. Mild to moderate atherosclerotic plaque of the thoracic aorta. At least 2 vessel coronary artery calcifications. Normal heart size. No significant pericardial effusion. The main pulmonary artery is normal in caliber. No central or proximal segmental pulmonary embolus. Mediastinum/Nodes: No enlarged mediastinal, hilar, or axillary lymph nodes. Thyroid gland, trachea, and esophagus demonstrate no significant findings. Lungs/Pleura: Limited evaluation due to respiratory motion artifact. Linear atelectasis versus scarring. No focal consolidation. Persistent tree-in-bud nodularity within the right middle lobe and right upper lobe. Stable right middle lobe subpleural 7 x 5 mm nodule (6:74). Stable right upper lobe 1.9 x 0.9 cm pulmonary nodule (6:18). No pulmonary mass. No pleural effusion. No pneumothorax. Musculoskeletal: No chest wall abnormality. No suspicious lytic or blastic osseous lesions. No acute displaced fracture. Multilevel mild Review of the  MIP images confirms the above findings. CTA ABDOMEN AND PELVIS FINDINGS VASCULAR Aorta: Severe atherosclerotic plaque. Normal caliber aorta without aneurysm, dissection, vasculitis or significant stenosis. Celiac: Narrowing of the origin of the celiac artery. Otherwise patent without evidence of aneurysm, dissection, vasculitis or significant stenosis. SMA: Patent without evidence of aneurysm, dissection, vasculitis or significant stenosis. Renals: Both renal arteries are patent without evidence of aneurysm, dissection, vasculitis, fibromuscular dysplasia or significant stenosis. IMA: Patent without evidence of aneurysm, dissection, vasculitis or significant stenosis. Inflow: Moderate to severe atherosclerotic plaque. Patent without evidence of aneurysm, dissection, vasculitis or significant stenosis. Veins: No obvious venous abnormality within the limitations of this arterial phase study. Review of the MIP images confirms the above findings. NON-VASCULAR Hepatobiliary: Heterogeneous enhancement of the right hepatic lobe. Status post cholecystectomy. No intrahepatic biliary dilatation. Enlarged common bile duct measuring up to 1.2 cm which can be seen in the setting of post cholecystectomy. Pancreas: No focal lesion. Normal pancreatic contour. No surrounding inflammatory changes. No main pancreatic ductal dilatation. Spleen: The spleen is enlarged measuring up to 14 cm. There is a similar-appearing wedge-shaped peripheral hypodensity of the splenic parenchyma. Adrenals/Urinary Tract: No adrenal nodule bilaterally. Bilateral kidneys enhance symmetrically. No hydronephrosis. No hydroureter. The urinary bladder is unremarkable. Stomach/Bowel: Roux-en-Y gastric bypass surgical changes noted. Ingested material noted within the gastric pouch. Stomach is within normal limits. No evidence of bowel wall thickening or dilatation. Slightly improved stool burden. The appendix is not definitely identified with no inflammatory  changes in the right lower quadrant to suggest acute appendicitis. Lymphatic: No lymphadenopathy. Reproductive: Uterus and bilateral adnexa are unremarkable. Other: No intraperitoneal free fluid. No intraperitoneal free gas. No organized fluid collection. Musculoskeletal: No abdominal wall hernia or abnormality. No suspicious lytic or blastic osseous lesions. No acute displaced fracture. L3-L4 intervertebral disc space narrowing with endplate sclerosis. L4-L5 intervertebral disc space vacuum phenomenon with mild ex plate sclerosis. Review of the MIP images confirms the above findings. IMPRESSION: 1. No acute aortic abnormality. 2. Aortic Atherosclerosis (ICD10-I70.0). At least 2 vessel coronary artery calcifications. 3. No central or proximal segmental pulmonary embolus. 4. Persistent tree-in-bud nodularity within the right middle lobe and right upper lobe  suggestive of atypical pneumonia. 5. Right middle lobe subpleural 7 x 5 mm nodule as well as a right apical 1.2 x 0.9 cm pulmonary nodule. Non-contrast chest CT at 3-6 months is recommended. If the nodules are stable at time of repeat CT, then future CT at 18-24 months (from today's scan) is considered optional for low-risk patients, but is recommended for high-risk patients. This recommendation follows the consensus statement: Guidelines for Management of Incidental Pulmonary Nodules Detected on CT Images: From the Fleischner Society 2017; Radiology 2017; 284:228-243. 6. No acute intra-abdominal or intrapelvic abnormality with limited evaluation of the organs due to timing of contrast 7. Heterogeneous enhancement of the right hepatic lobe which may be due to perfusion anomaly versus underlying mass lesion. 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 When the patient is clinically stable and able to follow directions and hold their breath (preferably as an outpatient) further evaluation with  dedicated abdominal MRI liver protocol should be considered. 8. 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 Electronically Signed   By: Iven Finn M.D.   On: 12/17/2021 23:30   DG Chest Portable 1 View  Result Date: 12/17/2021 CLINICAL DATA:  Left-sided chest pain for 2 days, worse with deep inspiration and movement. EXAM: PORTABLE CHEST 1 VIEW COMPARISON:  Radiographs 11/02/2021 and 10/24/2021.  CT 11/02/2021. FINDINGS: 1838 hours. Two views submitted. The heart size and mediastinal contours are stable with aortic atherosclerosis. The lungs remain hyperinflated but clear. There is no pleural effusion or pneumothorax. No acute osseous findings are evident. Telemetry leads overlie the chest. IMPRESSION: Stable radiographic appearance of the chest. No acute findings identified. Electronically Signed   By: Richardean Sale M.D.   On: 12/17/2021 18:52    Procedures Procedures    Medications Ordered in ED Medications  morphine (PF) 2 MG/ML injection 2 mg (2 mg Intravenous Given 12/17/21 1854)  labetalol (NORMODYNE) injection 10 mg (10 mg Intravenous Given 12/17/21 1912)  morphine (PF) 4 MG/ML injection 4 mg (4 mg Intravenous Given 12/17/21 1948)  labetalol (NORMODYNE) injection 10 mg (10 mg Intravenous Given 12/17/21 2256)  iohexol (OMNIPAQUE) 350 MG/ML injection 80 mL (80 mLs Intravenous Contrast Given 12/17/21 2245)  cefTRIAXone (ROCEPHIN) 1 g in sodium chloride 0.9 % 100 mL IVPB (0 g Intravenous Stopped 12/18/21 0053)    ED Course/ Medical Decision Making/ A&P           HEART Score: 2                Medical Decision Making Problems Addressed: Chest pain, unspecified type: complicated acute illness or injury with systemic symptoms Hypertension, unspecified type: complicated acute illness or injury with systemic symptoms that poses a threat to life or bodily functions  Amount and/or Complexity of Data Reviewed Independent Historian: EMS External Data Reviewed: labs,  radiology, ECG and notes. Labs: ordered. Radiology: ordered. ECG/medicine tests: ordered.  Risk Prescription drug management.   Patient is a 46 year old female presents emergency department as above.  On initial presentation patient's blood pressure was 199/144.  She had a normal pulse and was saturating normally on room air.  She was afebrile.  Physical exam revealed tenderness palpation and chest discomfort on the patient's left-sided chest.  She described her pain as radiating through to her back and describes her pain as tearing.  Suspicion was for possible aortic dissection versus ACS.  Also possibility for underlying pneumonia.  Based on laboratory work and imaging studies will  be ordered.  CMP with hyponatremia at 134 but otherwise grossly unremarkable.  Delta troponin 11 and 10 respectively.  CBC without evidence of leukocytosis.  Slight normocytic anemia at 11.9.  Pregnancy test negative.  UA with trace leukocytes and positive nitrites but patient was not having any urinary symptoms.  Chest x-ray with no acute findings.  CT angio to identify possible dissection showed no acute aortic abnormalities.  No evidence of PEs.  There was persistent tree-in-bud nodularity within the right middle lobe and right upper lobe suggestive of atypical pneumonia.  There was a right middle lobe subpleural nodule and a right apical lobe nodule.  A CT was recommended in 3 to 6 months for follow-up.  No acute intra-abdominal or intrapelvic abnormalities were identified.  There was also underlying heterogenous enhancement of the right hepatic lobe, however this was likely secondary to her previous hepatic abscesses on the last admission.  Patient was given a one-time dose of ceftriaxone here in the emergency department.  In the absence of any underlying cough and improvement of her symptoms is felt to be low suspicion of severe underlying pneumonia.  Patient was also afebrile and not tachycardic throughout her stay  in the emergency department.  Plan was for patient to be discharged back to her skilled nursing facility and follow-up in the outpatient setting for ongoing management.  Plan and findings discussed with patient at bedside and she verbalized understanding was agreeable.  Patient discharged in the ED in stable condition.        Final Clinical Impression(s) / ED Diagnoses Final diagnoses:  Chest pain, unspecified type  Hypertension, unspecified type    Rx / DC Orders ED Discharge Orders     None         Nelta Numbers, MD 12/18/21 0101    Margette Fast, MD 12/20/21 [phone]

## 2021-12-17 NOTE — ED Notes (Signed)
Patient yelling and screaming stating that she is going to rip her IV out and that she is ready to go. This RN made provider aware of same and provider will come to bedside to speak with patient at this time.

## 2021-12-17 NOTE — Discharge Instructions (Signed)
You were seen in the emergency department for evaluation of chest pain.  Our scans here today were reviewed reassuring and did not show any signs of acute injury to your aorta.  Lab work did not show any evidence of heart attack.  No evidence of electrolyte abnormalities.  Your lab work did show some possible early signs of infection in your urine in your lung.  For this reason you were provided a dose of antibiotics here in the emergency department.  The cultures of your urine are pending and should the result is positive you may receive a call for additional antibiotics.  We recommend following up with your primary care provider or the primary care provider group listed above for follow-up and symptom recheck.

## 2021-12-17 NOTE — ED Triage Notes (Signed)
Pt BIB [facility] from [facility] d/t chest pain that started 2 days ago. L side CP. Non-radiating. Worse w deep breath and movement. Has not have any ASA. EKG unremarkable w EMS.   160/130, Manual  206/144 90HR  18RR

## 2021-12-17 NOTE — ED Notes (Signed)
Pt's BP above 190. MD made aware. Labetalol ordered.

## 2021-12-18 NOTE — ED Notes (Signed)
This RN spoke with Music therapist at [facility] and gave report in regards to patient's status and updated them that patient will be returning back to the facility. All discharge instructions reviewed with Madelynn Done RN who made this RN aware that we are they are not able to provide transportation for patient. PTAR to be arranged for patient at this time.

## 2021-12-18 NOTE — ED Notes (Signed)
PTAR Called 

## 2021-12-23 LAB — URINE CULTURE: Culture: >=100000 — AB

## 2021-12-24 ENCOUNTER — Telehealth: Payer: Self-pay | Admitting: *Deleted

## 2021-12-24 NOTE — Telephone Encounter (Signed)
Post ED Visit - Positive Culture Follow-up: Successful Patient Follow-Up  Culture assessed and recommendations reviewed by:  '[]'$  Elenor Quinones, Pharm.D. '[]'$  Heide Guile, Pharm.D., BCPS AQ-ID '[]'$  Parks Neptune, Pharm.D., BCPS '[]'$  Alycia Rossetti, Pharm.D., BCPS '[]'$  Ephesus, Pharm.D., BCPS, AAHIVP '[]'$  Legrand Como, Pharm.D., BCPS, AAHIVP '[]'$  Salome Arnt, PharmD, BCPS '[]'$  Johnnette Gourd, PharmD, BCPS '[]'$  Hughes Better, PharmD, BCPS '[]'$  Leeroy Cha, PharmD  Positive urine culture  '[x]'$  Patient discharged without antimicrobial prescription and treatment is now indicated '[]'$  Organism is resistant to prescribed ED discharge antimicrobial '[]'$  Patient with positive blood cultures  Changes discussed with ED provider: Isla Pence, MD New antibiotic prescription:  If has UTI symptoms, start Bactrim DS PO BID x 3 days Faxed to [place]  Staff at [facility] date 12/24/2021, time [place], [address] 12/24/2021, 1:40 PM

## 2021-12-24 NOTE — Progress Notes (Signed)
ED Antimicrobial Stewardship Positive Culture Follow Up   Terri [name] is an 46 y.o. female who presented to [facility] on 12/17/2021 with a chief complaint of chest pain. Patient denies urinary symptoms and is likely experiencing asymptomatic bacteruria.   Chief Complaint  Patient presents with   Chest Pain   Recent Results (from the past 720 hour(s))  Urine Culture     Status: Abnormal   Collection Time: 12/17/21  9:59 PM   Specimen: Urine, Clean Catch  Result Value Ref Range Status   Specimen Description URINE, CLEAN CATCH  Final   Special Requests   Final    NONE Performed at [facility], [address], [place], [place] [address]    Culture (A)  Final    >=100,000 COLONIES/mL PROVIDENCIA STUARTII 10,000 COLONIES/mL KLEBSIELLA PNEUMONIAE    Report Status 12/23/2021 FINAL  Final   Organism ID, Bacteria PROVIDENCIA STUARTII (A)  Final   Organism ID, Bacteria KLEBSIELLA PNEUMONIAE (A)  Final      Susceptibility   Klebsiella pneumoniae - MIC*    AMPICILLIN >=32 RESISTANT Resistant     CEFAZOLIN <=4 SENSITIVE Sensitive     CEFEPIME <=0.12 SENSITIVE Sensitive     CEFTRIAXONE <=0.25 SENSITIVE Sensitive     CIPROFLOXACIN <=0.25 SENSITIVE Sensitive     GENTAMICIN <=1 SENSITIVE Sensitive     IMIPENEM <=0.25 SENSITIVE Sensitive     NITROFURANTOIN 64 INTERMEDIATE Intermediate     TRIMETH/SULFA <=20 SENSITIVE Sensitive     AMPICILLIN/SULBACTAM 4 SENSITIVE Sensitive     PIP/TAZO <=4 SENSITIVE Sensitive     * 10,000 COLONIES/mL KLEBSIELLA PNEUMONIAE   Providencia stuartii - MIC*    AMPICILLIN RESISTANT Resistant     CEFAZOLIN >=64 RESISTANT Resistant     CEFEPIME <=0.12 SENSITIVE Sensitive     CEFTRIAXONE <=0.25 SENSITIVE Sensitive     CIPROFLOXACIN <=0.25 SENSITIVE Sensitive     GENTAMICIN RESISTANT Resistant     IMIPENEM 2 SENSITIVE Sensitive     NITROFURANTOIN 256 RESISTANT Resistant     TRIMETH/SULFA <=20 SENSITIVE Sensitive     AMPICILLIN/SULBACTAM 16 INTERMEDIATE  Intermediate     PIP/TAZO <=4 SENSITIVE Sensitive     * >=100,000 COLONIES/mL PROVIDENCIA STUARTII    '[x]'$  Patient discharged originally without antimicrobial agent. Will f/u to assess development of any new symptoms. If no urinary symptoms, will continue with no antibiotic therapy. If new urinary symptoms, will initiate Bactrim DS BID x 3 days.   ED Provider: Isla Pence, MD    Billey Gosling, PharmD PGY1 Pharmacy Resident 8/3/20239:52 AM  Clinical Pharmacist Monday - Friday phone -  [phone] Saturday - Sunday phone - [phone]

## 2021-12-30 ENCOUNTER — Emergency Department (HOSPITAL_COMMUNITY)
Admission: EM | Admit: 2021-12-30 | Discharge: 2021-12-30 | Disposition: A | Discharge status: Home / Self Care | Payer: Medicaid Other | Attending: Emergency Medicine | Admitting: Emergency Medicine

## 2021-12-30 ENCOUNTER — Encounter (HOSPITAL_COMMUNITY): Payer: Self-pay

## 2021-12-30 ENCOUNTER — Other Ambulatory Visit: Payer: Self-pay

## 2021-12-30 ENCOUNTER — Emergency Department (HOSPITAL_COMMUNITY): Payer: Medicaid Other

## 2021-12-30 DIAGNOSIS — I10 Essential (primary) hypertension: Secondary | ICD-10-CM | POA: Insufficient documentation

## 2021-12-30 DIAGNOSIS — R079 Chest pain, unspecified: Secondary | ICD-10-CM | POA: Diagnosis present

## 2021-12-30 DIAGNOSIS — Z5329 Procedure and treatment not carried out because of patient's decision for other reasons: Secondary | ICD-10-CM

## 2021-12-30 DIAGNOSIS — Z79899 Other long term (current) drug therapy: Secondary | ICD-10-CM | POA: Diagnosis not present

## 2021-12-30 DIAGNOSIS — R109 Unspecified abdominal pain: Secondary | ICD-10-CM | POA: Insufficient documentation

## 2021-12-30 MED ORDER — CLONIDINE HCL 0.1 MG/24HR TD PTWK
0.1000 mg | MEDICATED_PATCH | TRANSDERMAL | Status: DC
  Filled 2021-12-30: qty 1

## 2021-12-30 MED ORDER — ACETAMINOPHEN 325 MG PO TABS
650.0000 mg | ORAL_TABLET | Freq: Once | ORAL | Status: DC
  Filled 2021-12-30: qty 2

## 2021-12-30 MED ORDER — LISINOPRIL 10 MG PO TABS
10.0000 mg | ORAL_TABLET | Freq: Once | ORAL | Status: DC
  Filled 2021-12-30: qty 1

## 2021-12-30 NOTE — ED Provider Notes (Signed)
[facility] DEPT Provider Note   CSN: [id number] Arrival date & time: 12/30/21  1609     History No chief complaint on file.   Terri [name] is a 46 y.o. female with history of recurrent GI bleeds, hypertension, polysubstance abuse, Roux-en-Y gastric bypass.  Patient presents to ED for evaluation of chest pain, abdominal pain.  When I went to this patient's room to engage her, she stated that she wished to be discharged home.  Patient states that she "knows that we are just going to tell her that we do not know what is going on like we have done the last 2 times".  I explained to the patient that we have not even began the workup yet, we do not even have lab values in front of [place] to make that decision.  She continue that she is sick of people telling her that they do not know what is going on, she is sick of waiting for care.  Patient stated that she believes that coming to this facility would expedite her care however she was forced to wait which irritated her.  I attempted to reengage with the patient, allow her to have [place] work her up for her complaint however she continued to deny this.  The patient continued to state that she just wished to be discharged home and requested that we call "a ride" for her.  HPI     Home Medications Prior to Admission medications   Medication Sig Start Date End Date Taking? Authorizing Provider  calcium carbonate (TUMS - DOSED IN MG ELEMENTAL CALCIUM) 500 MG chewable tablet Chew 1 tablet (200 mg of elemental calcium total) by mouth 3 (three) times daily. 10/12/21   Amin, Jeanella Flattery, MD  cloNIDine (CATAPRES - DOSED IN MG/24 HR) 0.1 mg/24hr patch Place 1 patch (0.1 mg total) onto the skin once a week. 10/16/21   Amin, Jeanella Flattery, MD  cyclobenzaprine (FLEXERIL) 5 MG tablet Take 1 tablet (5 mg total) by mouth 3 (three) times daily as needed for muscle spasms. 10/12/21   Amin, Jeanella Flattery, MD  dronabinol (MARINOL) 5 MG  capsule Take 1 capsule (5 mg total) by mouth 2 (two) times daily before lunch and supper. 10/12/21   Amin, Jeanella Flattery, MD  folic acid (FOLVITE) 1 MG tablet Take 1 tablet (1 mg total) by mouth daily. 10/13/21   Amin, Jeanella Flattery, MD  HYDROcodone-acetaminophen (NORCO) 7.5-325 MG tablet Take 1 tablet by mouth every 6 (six) hours as needed for moderate pain or severe pain. 10/12/21   Amin, Jeanella Flattery, MD  lisinopril (ZESTRIL) 10 MG tablet Take 1 tablet (10 mg total) by mouth at bedtime. 10/12/21   Amin, Jeanella Flattery, MD  pantoprazole (PROTONIX) 40 MG tablet Take 1 tablet (40 mg total) by mouth daily before breakfast. 10/12/21   Amin, Jeanella Flattery, MD  thiamine 100 MG tablet Take 1 tablet (100 mg total) by mouth daily. 10/13/21   Damita Lack, MD      Allergies    Nsaids and Ketorolac tromethamine    Review of Systems   Review of Systems  Unable to perform ROS: Other (Level 5 caveat)    Physical Exam Updated Vital Signs BP (!) 187/136   Pulse 83   Temp 98.6 F (37 C) (Oral)   Resp 13   LMP  (LMP Unknown)   SpO2 100%  Physical Exam Vitals and nursing note reviewed.  Constitutional:      General: She  is not in acute distress.    Appearance: She is ill-appearing. She is not toxic-appearing.  HENT:     Head: Normocephalic and atraumatic.  Pulmonary:     Effort: No respiratory distress.  Skin:    Coloration: Skin is not jaundiced or pale.  Neurological:     Mental Status: She is alert and oriented to person, place, and time.  Psychiatric:        Behavior: Behavior normal.     ED Results / Procedures / Treatments   Labs (all labs ordered are listed, but only abnormal results are displayed) Labs Reviewed  COMPREHENSIVE METABOLIC PANEL  CBC WITH DIFFERENTIAL/PLATELET  LIPASE, BLOOD  URINALYSIS, ROUTINE W REFLEX MICROSCOPIC  I-STAT BETA HCG BLOOD, ED ([facility], [facility], [facility] ONLY)  TROPONIN I (HIGH SENSITIVITY)  TROPONIN I (HIGH SENSITIVITY)    EKG None  Radiology DG Chest Port  1 View  Result Date: 12/30/2021 CLINICAL DATA:  Abdominal pain that radiates to chest. Upper back pain EXAM: PORTABLE CHEST 1 VIEW COMPARISON:  12/17/2021 plain film and CT. FINDINGS: Two frontal views of the chest. Numerous leads and wires project over the chest. Midline trachea. Normal heart size. No pleural fluid. Skin folds over the chest bilaterally, without convincing evidence of pneumothorax. Hyperinflation, without focal consolidation. IMPRESSION: Hyperinflation, without acute disease. Electronically Signed   By: Abigail Miyamoto M.D.   On: 12/30/2021 17:14    Procedures Procedures    Medications Ordered in ED Medications  cloNIDine (CATAPRES - Dosed in mg/24 hr) patch 0.1 mg (0.1 mg Transdermal Patient Refused/Not Given 12/30/21 1750)  lisinopril (ZESTRIL) tablet 10 mg (10 mg Oral Patient Refused/Not Given 12/30/21 1750)  acetaminophen (TYLENOL) tablet 650 mg (650 mg Oral Patient Refused/Not Given 12/30/21 1750)    ED Course/ Medical Decision Making/ A&P                           Medical Decision Making  When I went to this patient's room to engage her, she stated that she wished to be discharged home.  Patient states that she "knows that we are just going to tell her that we do not know what is going on like we have done the last 2 times".  I explained to the patient that we have not even began the workup yet, we do not even have lab values in front of [place] to make that decision.  She continue that she is sick of people telling her that they do not know what is going on, she is sick of waiting for care.  Patient stated that she believes that coming to this facility would expedite her care however she was forced to wait which irritated her.  I attempted to reengage with the patient, allow her to have [place] work her up for her complaint however she continued to deny this.  The patient continued to state that she just wished to be discharged home and requested that we call "a ride" for her.  Patient  left AGAINST MEDICAL ADVICE.  Final Clinical Impression(s) / ED Diagnoses Final diagnoses:  Left against medical advice    Rx / DC Orders ED Discharge Orders     None         Lawana Chambers 12/30/21 1849    Davonna Belling, MD 12/31/21 [phone]

## 2021-12-30 NOTE — ED Triage Notes (Signed)
Pt arrives via EMS. Pt c/o abdominal pain that radiates to chest and upper back that worsens after she eats. Pt was signed out of [facility] by her sister today.

## 2021-12-30 NOTE — ED Notes (Signed)
Pt daughter arrived to pick up pt.

## 2021-12-30 NOTE — ED Notes (Signed)
Pt refused medication and blood work. States she wants to leave. This RN contacted daughter but did not answer

## 2021-12-30 NOTE — ED Provider Triage Note (Signed)
.  Emergency Medicine Provider Triage Evaluation Note  Terri [name] , a 46 y.o. female  was evaluated in triage.  Pt complains of abdominal pain.  Patient states that symptoms began around noon after eating lunch she notes diffuse abdominal pain with radiation to her back as well as her chest.  She was recently signed out from [facility] by her sister today.  Patient states that she is unaware of why she was in the rehabilitation facility in the first place.  She notes persistence of symptoms since her onset.  She denies fever, chills, night sweats, urinary/vaginal symptoms, change in bowel habits.  Review of Systems  Positive: See above Negative:   Physical Exam  BP (!) 187/136   Pulse 83   Temp 98.6 F (37 C) (Oral)   Resp 13   LMP  (LMP Unknown)   SpO2 100%  Gen:   Awake, no distress   Resp:  Normal effort  MSK:   Moves extremities without difficulty  Other:  Diffuse tenderness to palpation of abdomen.  Medical Decision Making  Medically screening exam initiated at 4:38 PM.  Appropriate orders placed.  [name] was informed that the remainder of the evaluation will be completed by another provider, this initial triage assessment does not replace that evaluation, and the importance of remaining in the ED until their evaluation is complete.     Wilnette Kales, [place] 12/30/21 [phone]

## 2022-01-02 IMAGING — CT CT TIBIA FIBULA *R* W/O CM
2 of 3 series · 11 of 33 positions shown, 13 images · non-contrast
Comparison: 05/22/2021

CLINICAL DATA: Right leg wound, erythema for 1 week

EXAM:
CT OF THE LOWER RIGHT EXTREMITY WITHOUT CONTRAST
TECHNIQUE: Multidetector CT imaging of the right lower extremity was performed
according to the standard protocol.

[Series 4: axial st · axial · 0.40mm/px · z∈[-587,-125]mm · 8 of 274 slices shown, 10 images]
[im 22/274  soft-tissue]
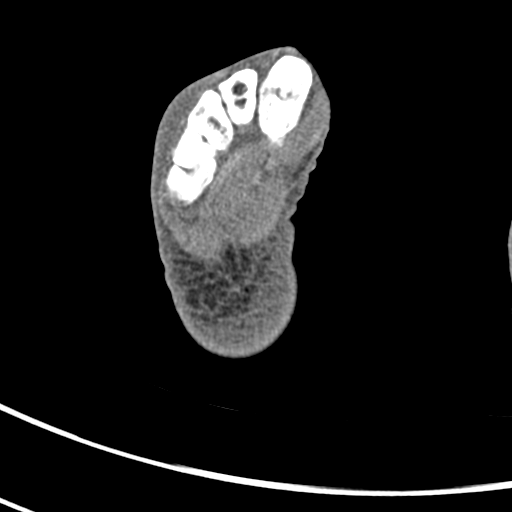
[im 22/274  bone]
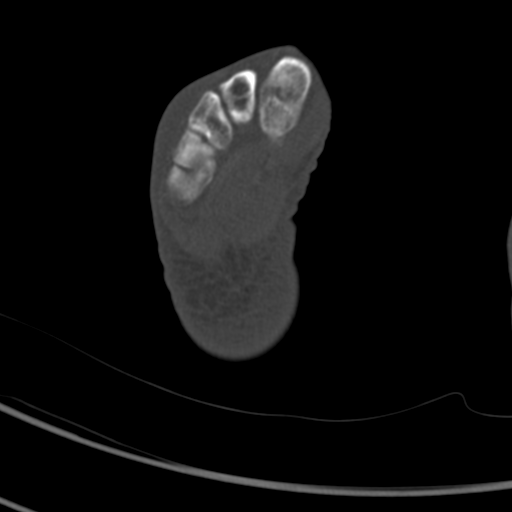
[im 64/274  bone]
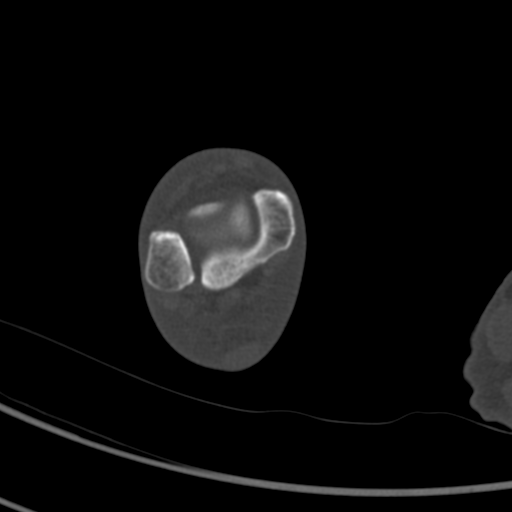
[im 85/274  bone]
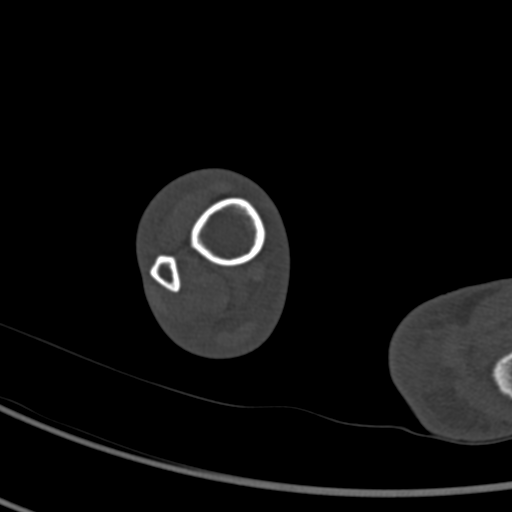
[im 127/274  bone]
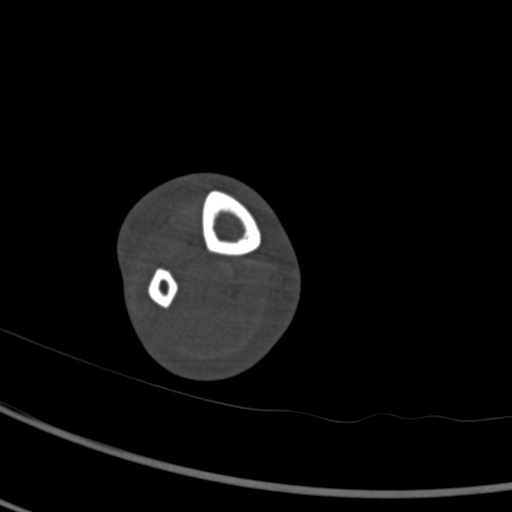
[im 148/274  soft-tissue]
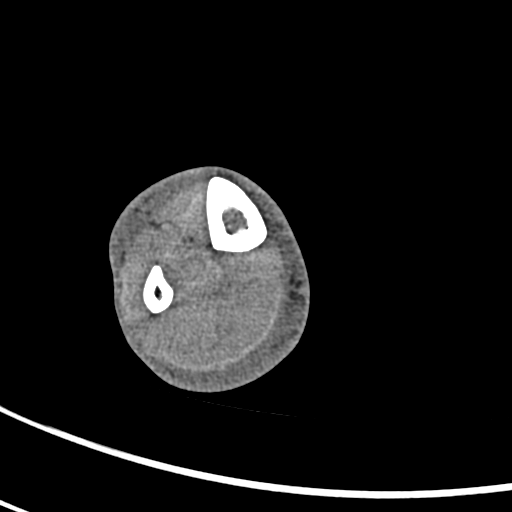
[im 148/274  bone]
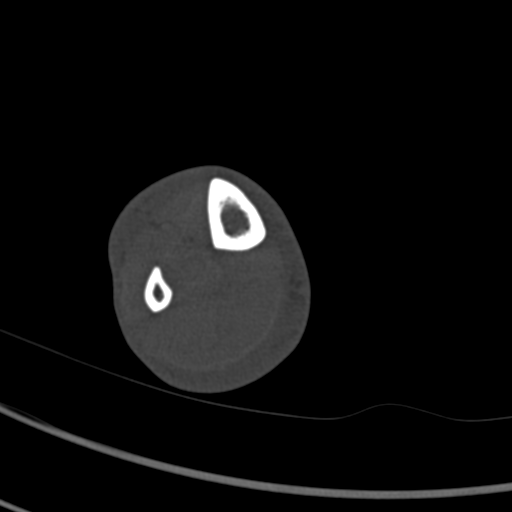
[im 190/274  bone]
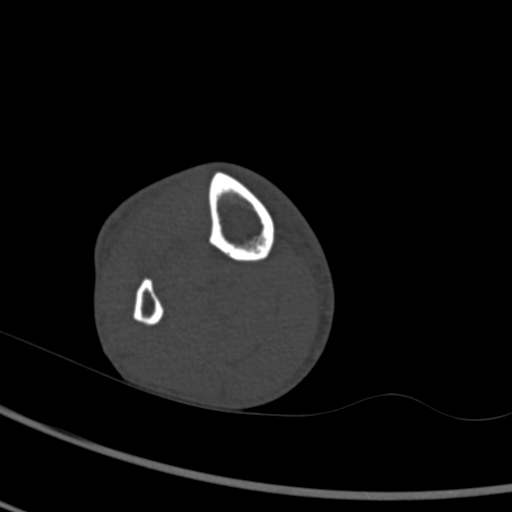
[im 211/274  bone]
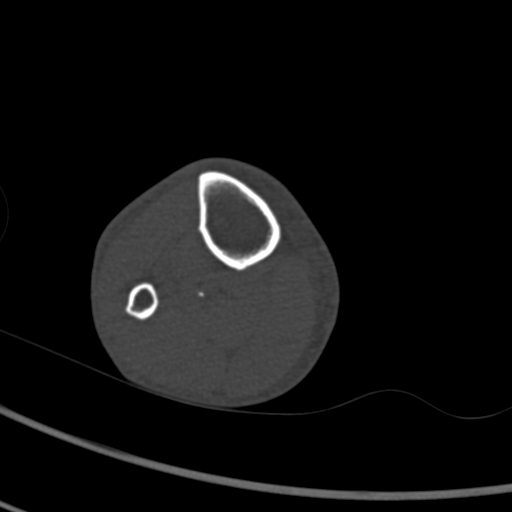
[im 253/274  bone]
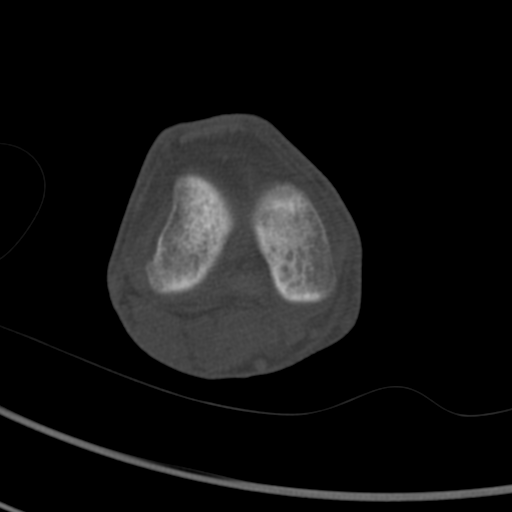

[Series 9: coronal st · coronal · 0.37mm/px · 3 of 98 slices shown]
[im 20/98  bone]
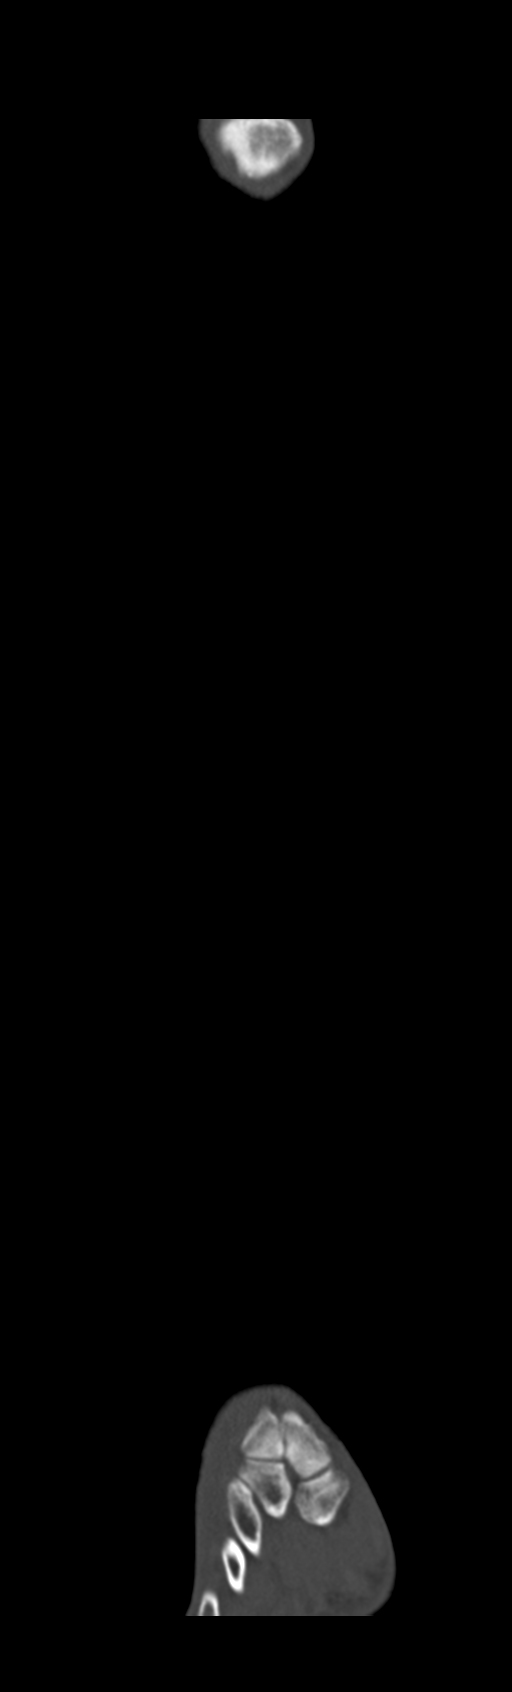
[im 39/98  bone]
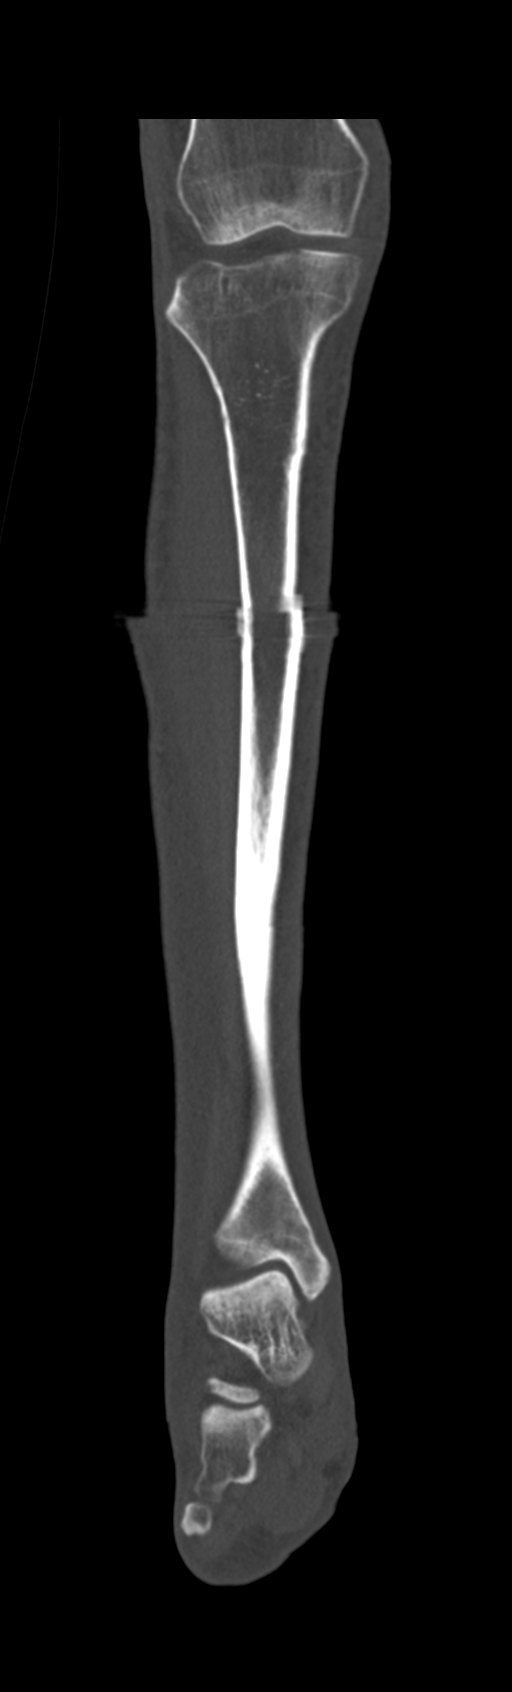
[im 59/98  bone]
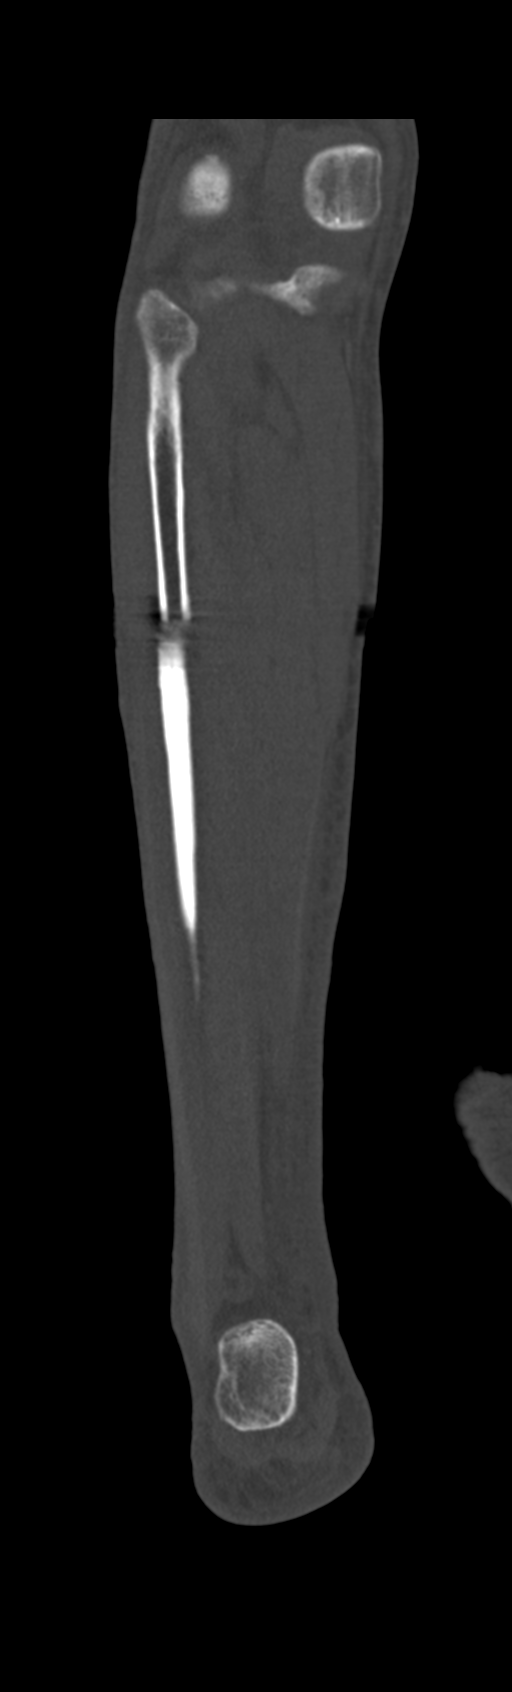

[11 of 33 positions shown; findings below may reference images not displayed]

FINDINGS: Bones/Joint/Cartilage

There are no acute or destructive bony lesions. Specifically, no
evidence of bony destruction or periosteal reaction to suggest
osteomyelitis.

Mild 3 compartmental osteoarthritis of the right knee. Mild
osteoarthritis within the right hindfoot and midfoot.

Ligaments

Suboptimally assessed by CT.

Muscles and Tendons

No acute findings.

Soft tissues

There is diffuse subcutaneous edema, greatest within the distal
right lower leg proximal to the ankle. No evidence of fluid
collection or abscess on this unenhanced exam. No subcutaneous gas.
Scattered vascular calcifications are noted.

Reconstructed images demonstrate no additional findings.
IMPRESSION: 1. Marked subcutaneous edema within the distal right lower leg. No
evidence of fluid collection or abscess.
2. No acute or destructive bony lesions. No CT evidence of
osteomyelitis.

## 2022-01-02 IMAGING — CR DG TIBIA/FIBULA 2V*R*
4 series · 4 of 4 positions shown · non-contrast
Comparison: None.

CLINICAL DATA: Right lower extremity wound.  Redness and swelling.

EXAM:
RIGHT TIBIA AND FIBULA - 2 VIEW

[x tib-fib lat right (1 of 2)]
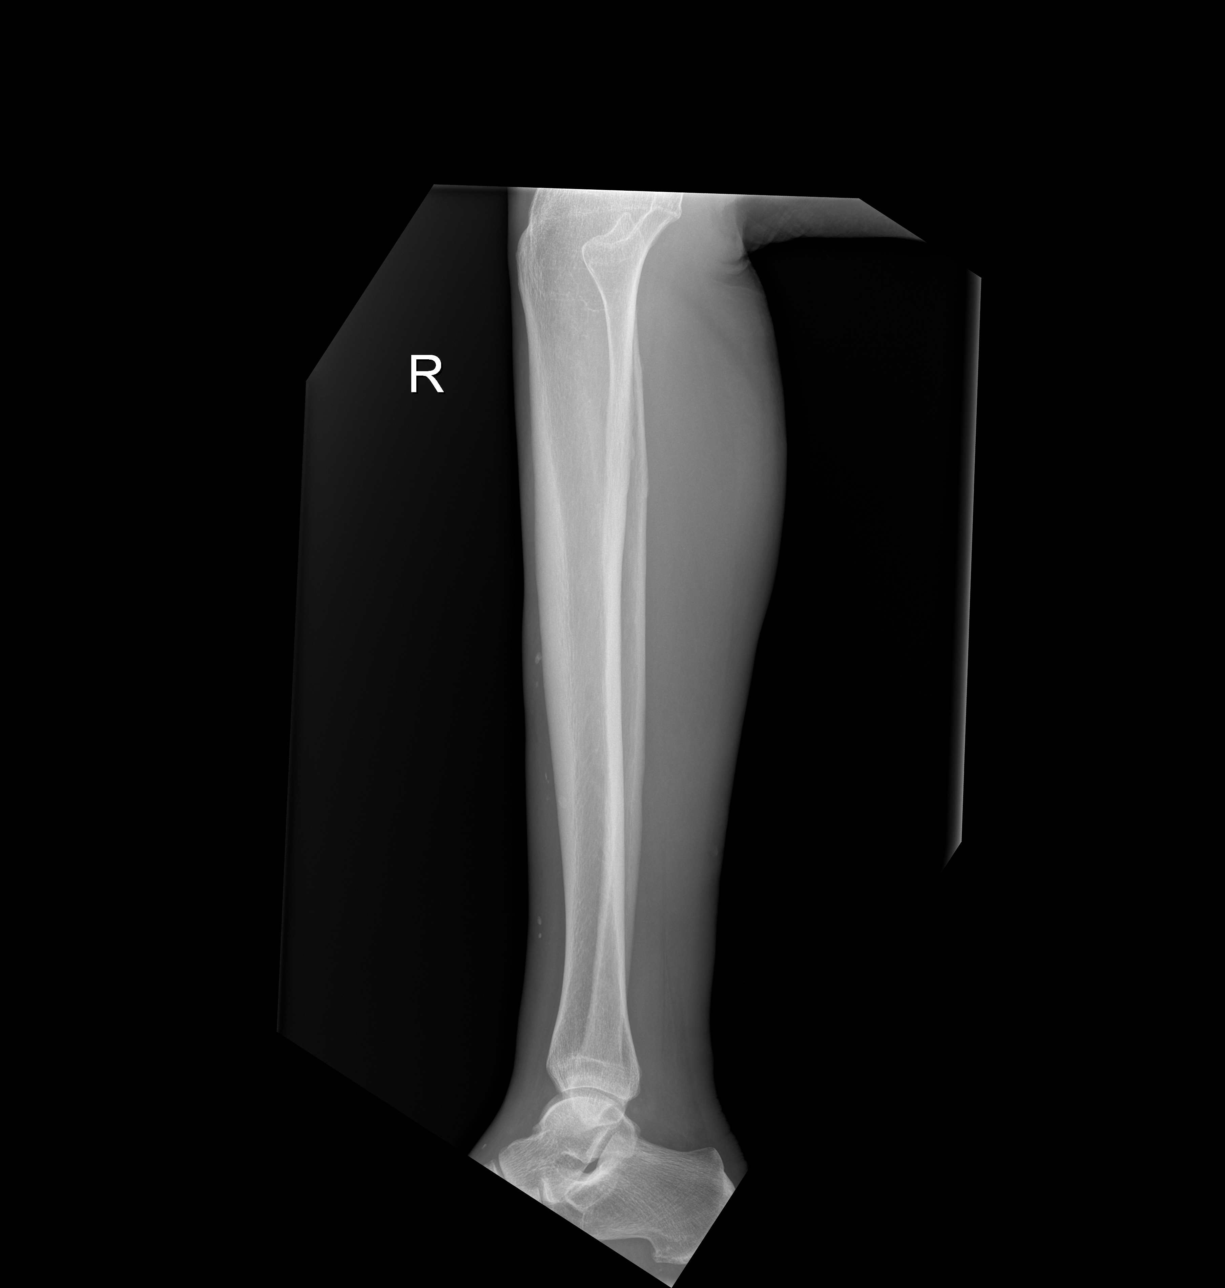

[x tib-fib lat right (2 of 2)]
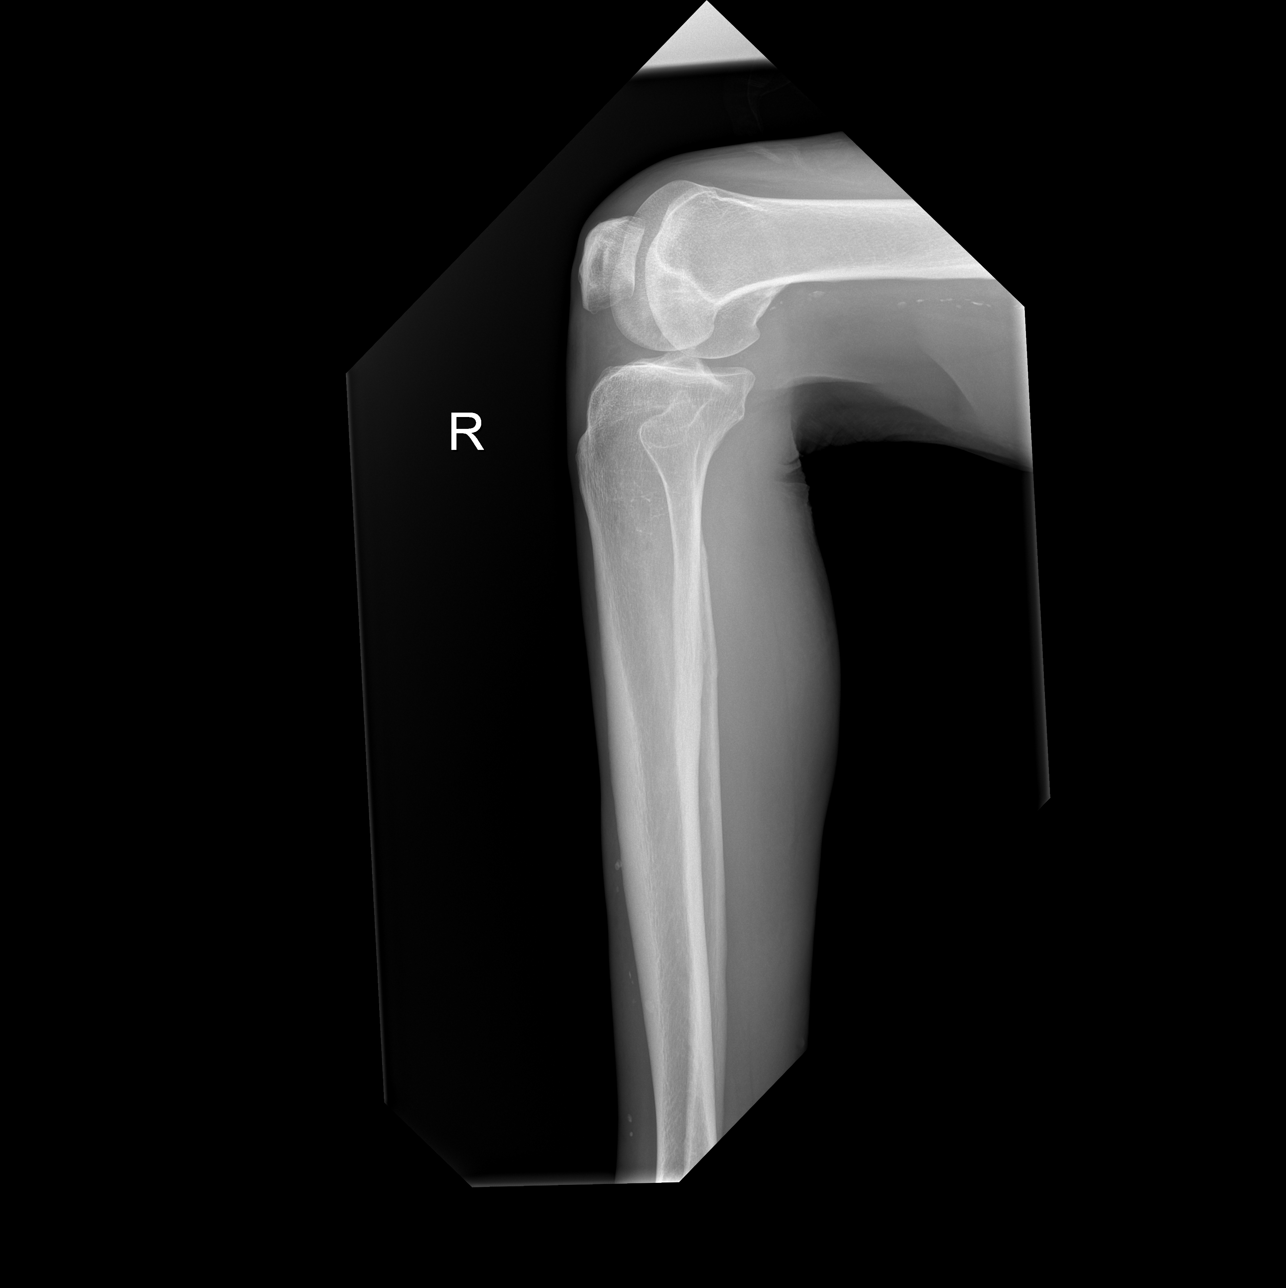

[x tib-fib ap right (1 of 2)]
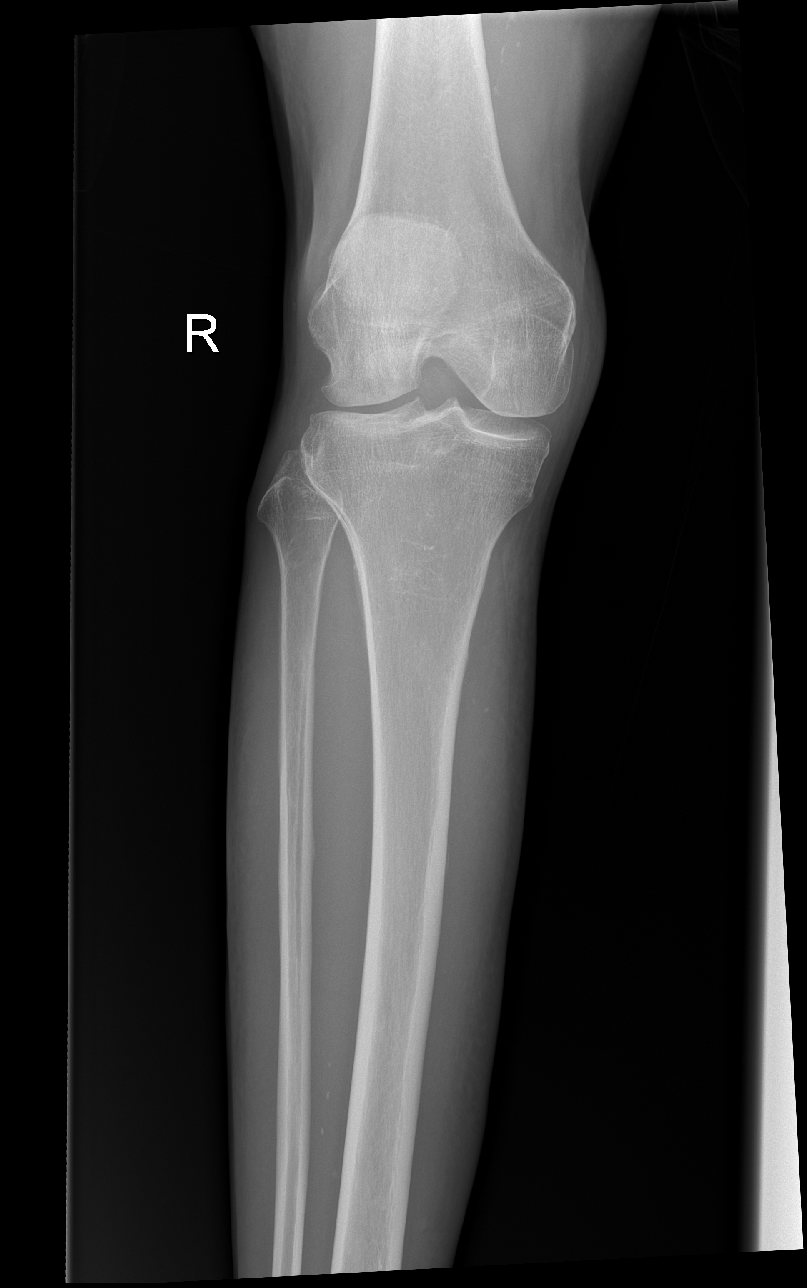

[x tib-fib ap right (2 of 2)]
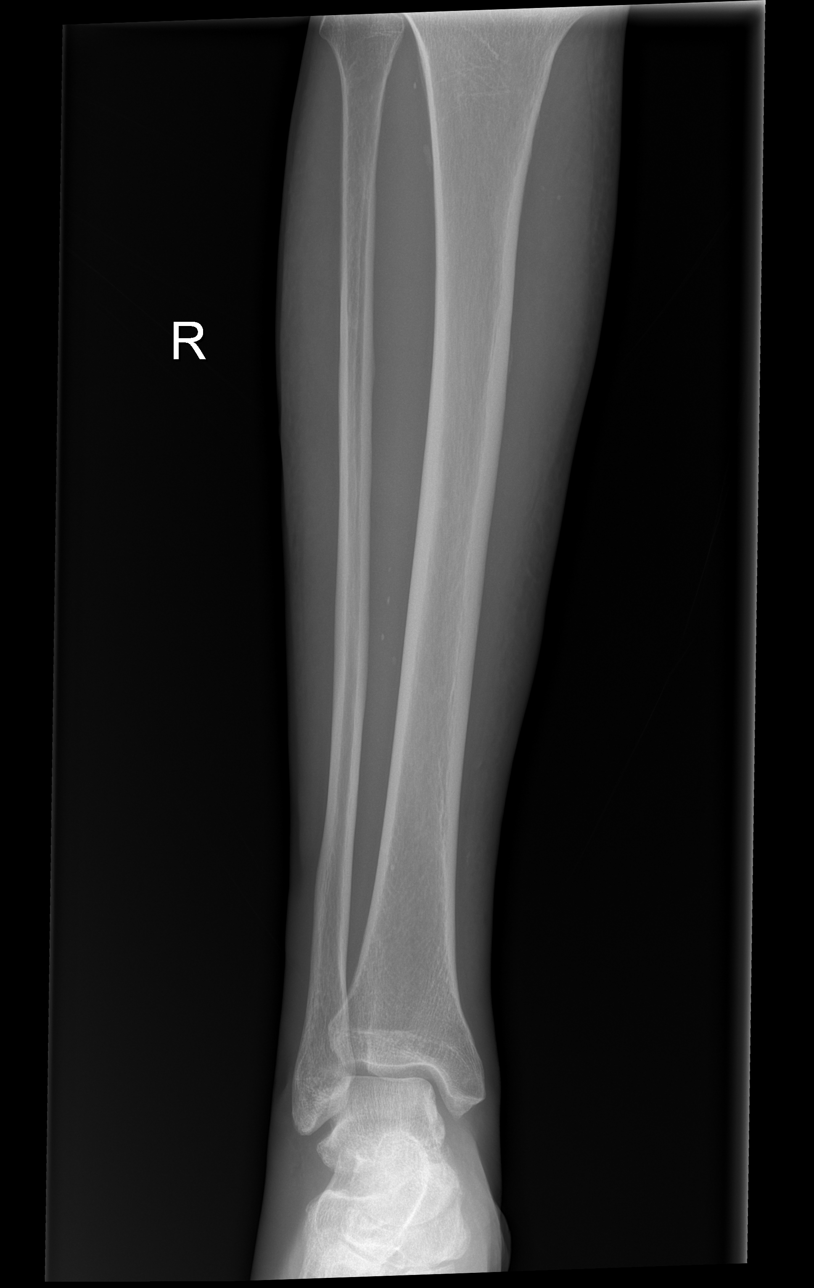

[4 of 4 positions shown; findings below may reference images not displayed]

FINDINGS: Soft tissue swelling is noted along the anterior lower extremity.
Soft tissue densities are likely vascular. No underlying osseous
abnormality or soft tissue gas present. Knee and ankle joint are
unremarkable.
IMPRESSION: 1. Soft tissue swelling along the anterior lower extremity without
underlying osseous abnormality. Findings consistent with cellulitis.
2. Soft tissue densities are likely vascular.

## 2022-01-02 IMAGING — CR DG CHEST 1V PORT
1 series · 1 of 1 positions shown · non-contrast
Comparison: Chest x-ray 04/18/2021.

CLINICAL DATA: Questionable sepsis.

EXAM:
PORTABLE CHEST 1 VIEW

[w chest pa]
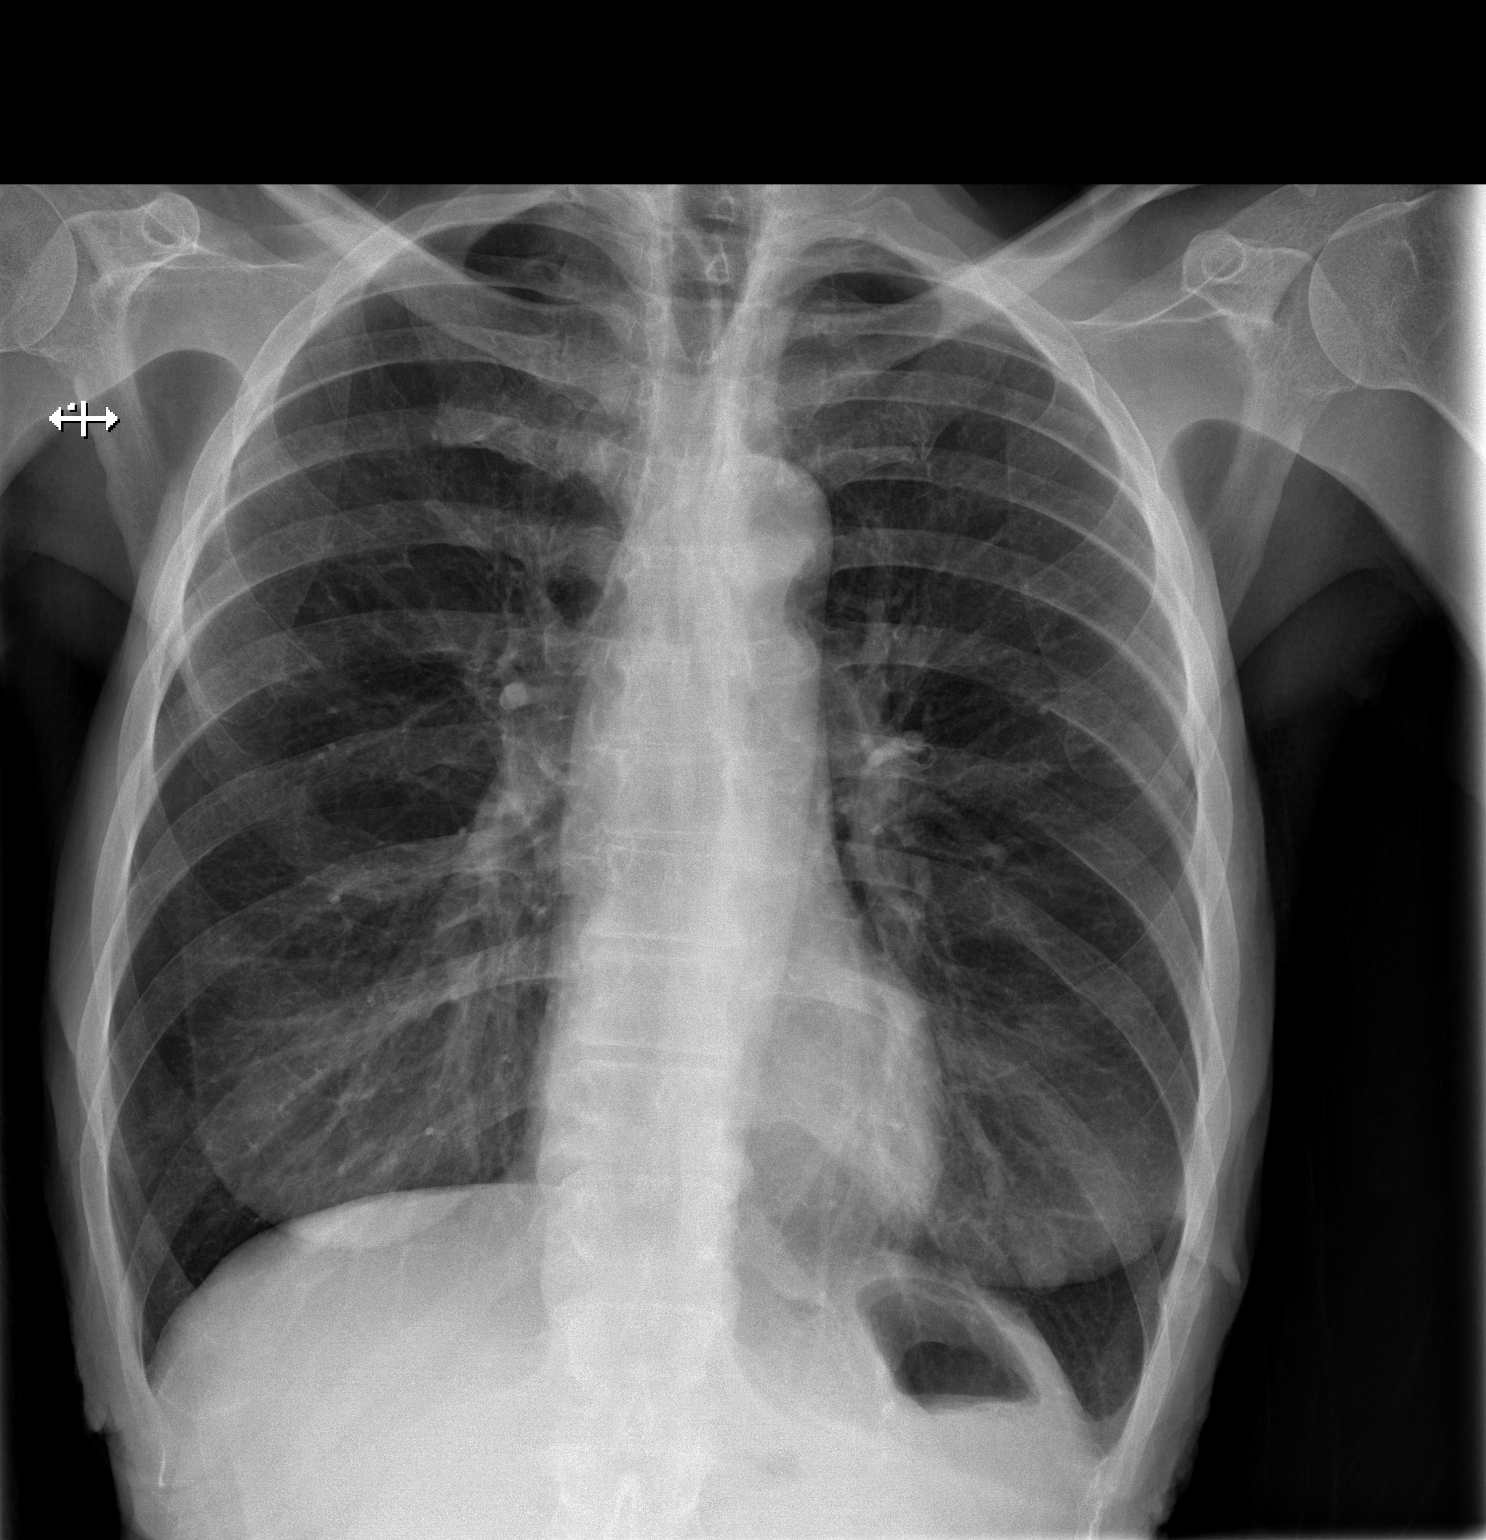

[1 of 1 positions shown; findings below may reference images not displayed]

FINDINGS: Lungs are hyperinflated, unchanged. The heart size and mediastinal
contours are within normal limits. Both lungs are clear. The
visualized skeletal structures are unremarkable.
IMPRESSION: No active disease.

## 2022-01-07 IMAGING — DX DG CHEST 1V PORT
1 series · 2 of 2 positions shown · non-contrast
Comparison: Chest x-ray 04/13/2021.

CLINICAL DATA: Shortness of breath and congestion.

EXAM:
PORTABLE CHEST 1 VIEW

[Series 1: chest ap · 0.14mm/px · 2 of 2 slices shown]
[im 1/2]
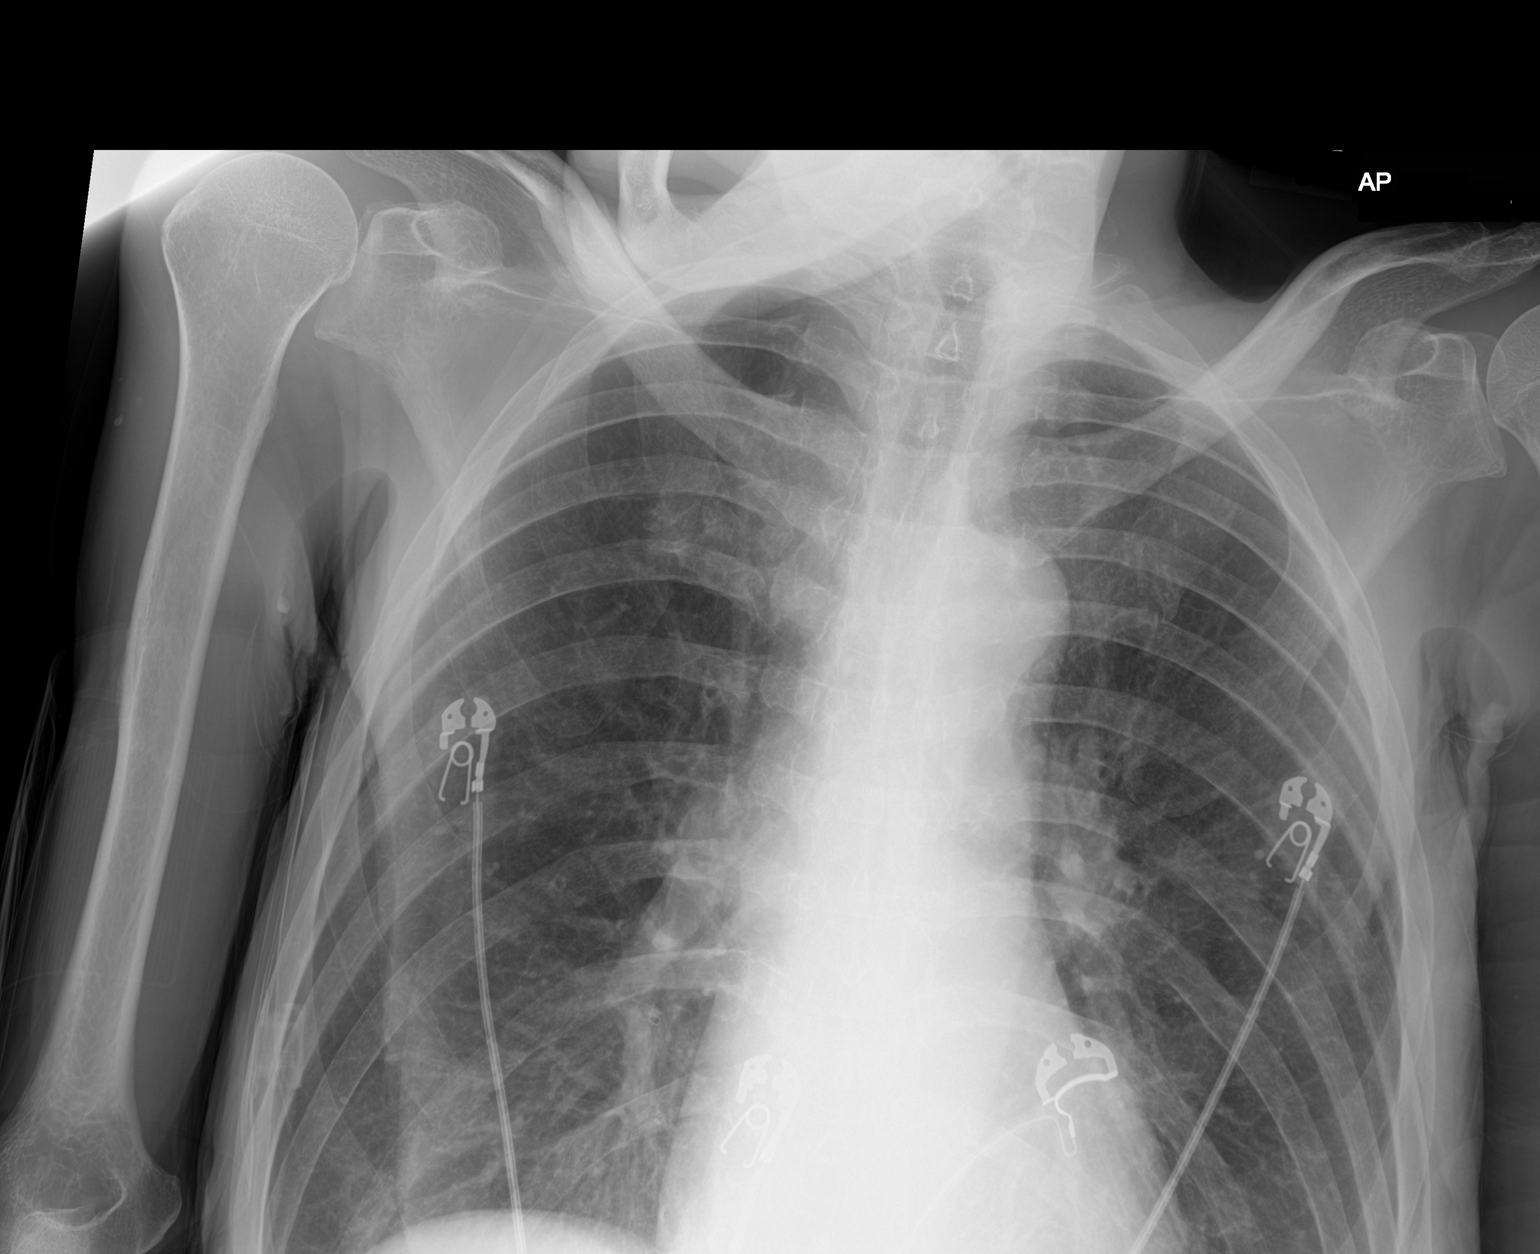
[im 2/2]
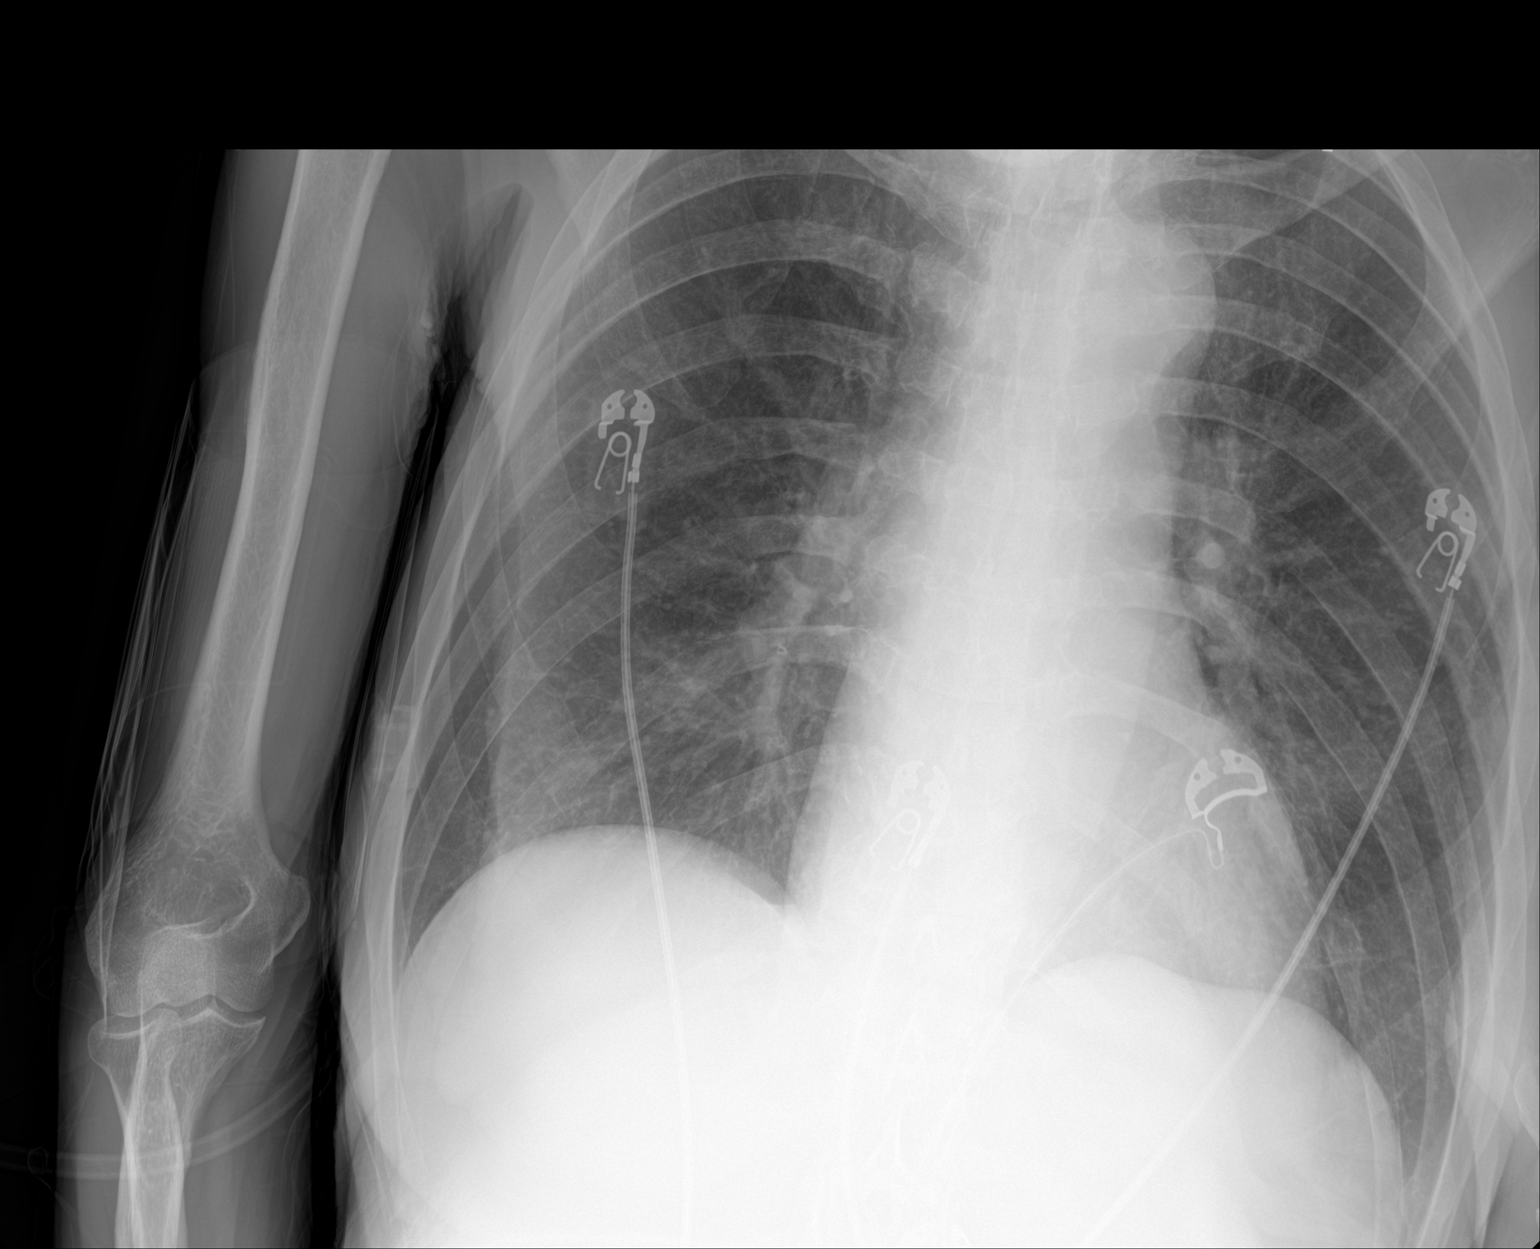

[2 of 2 positions shown; findings below may reference images not displayed]

FINDINGS: The heart size and mediastinal contours are within normal limits.
Both lungs are clear. The visualized skeletal structures are
unremarkable.
IMPRESSION: No active disease.

## 2022-01-08 IMAGING — CT CT ANGIO CHEST
2 of 6 series · 18 of 36 positions shown · IV contrast (OMNIPAQUE 350)
Comparison: 04/02/2021

CLINICAL DATA: Pulmonary embolism (PE) suspected, positive D-dimer.
Dyspnea, cough, chest congestion

EXAM:
CT ANGIOGRAPHY CHEST WITH CONTRAST
TECHNIQUE: Multidetector CT imaging of the chest was performed using the
standard protocol during bolus administration of intravenous
contrast. Multiplanar CT image reconstructions and MIPs were
obtained to evaluate the vascular anatomy.
CONTRAST:  80mL OMNIPAQUE IOHEXOL 350 MG/ML SOLN

[Series 6: thins · axial · 0.62mm/px · z∈[-150,+134]mm · 17 of 321 slices shown]
[im 18/321  lung]
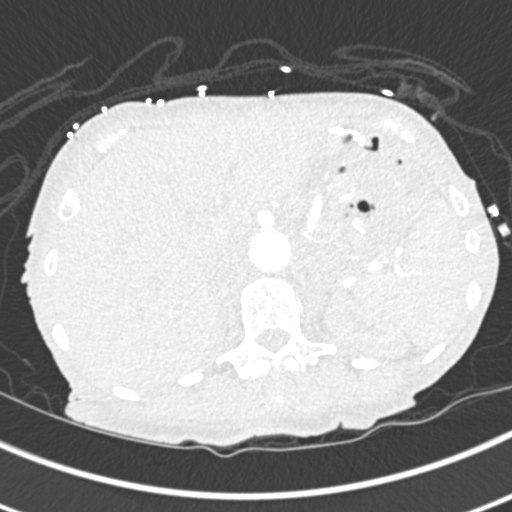
[im 36/321  mediastinal]
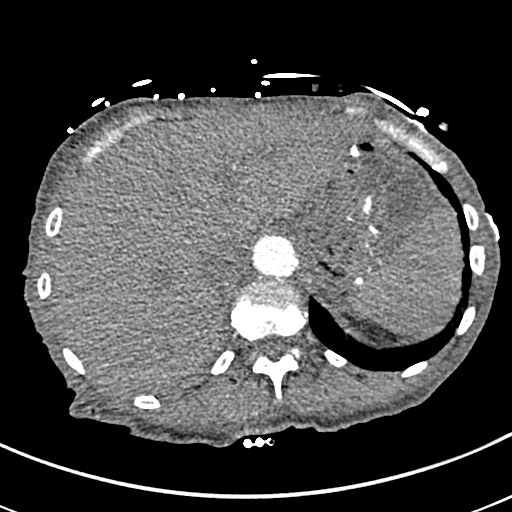
[im 54/321  lung]
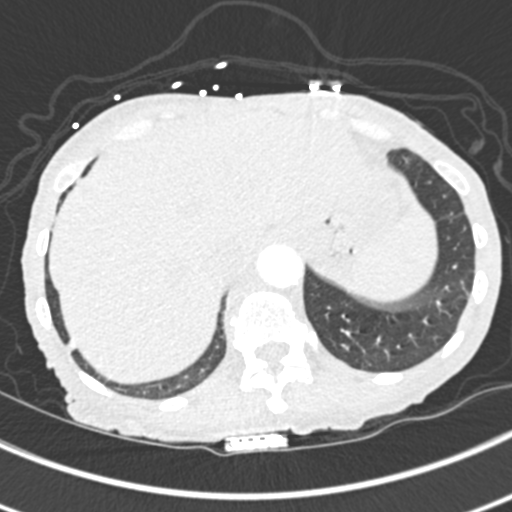
[im 72/321  mediastinal]
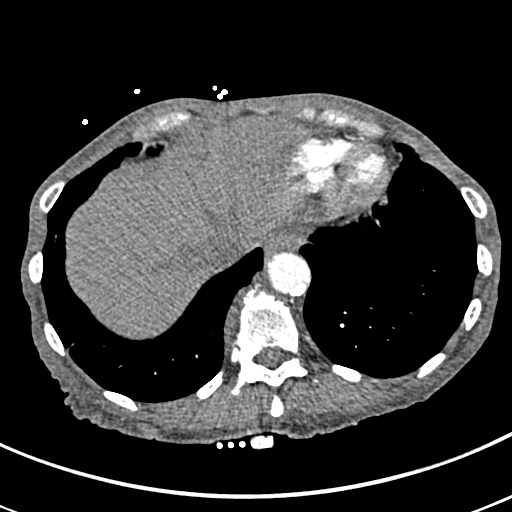
[im 89/321  lung]
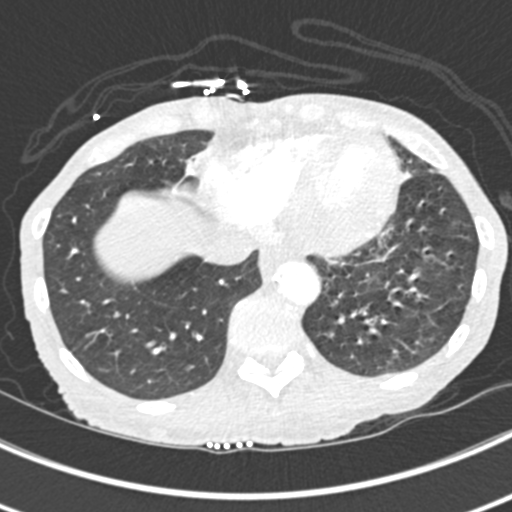
[im 107/321  mediastinal]
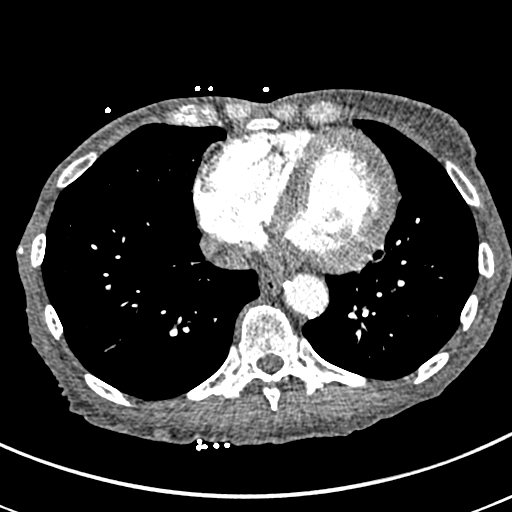
[im 125/321  lung]
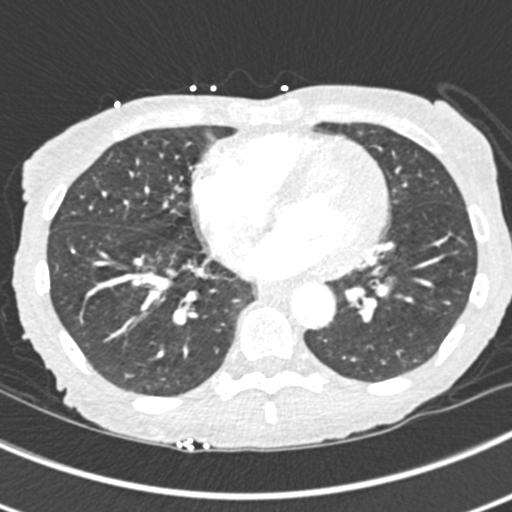
[im 143/321  mediastinal]
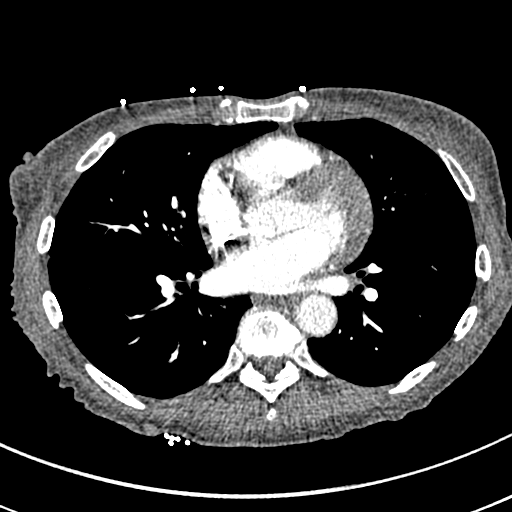
[im 161/321  lung]
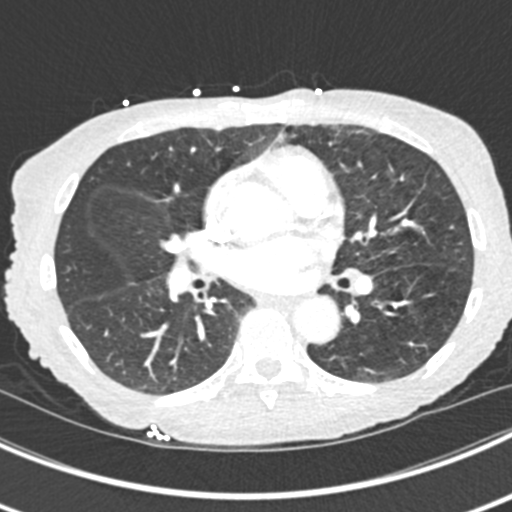
[im 178/321  mediastinal]
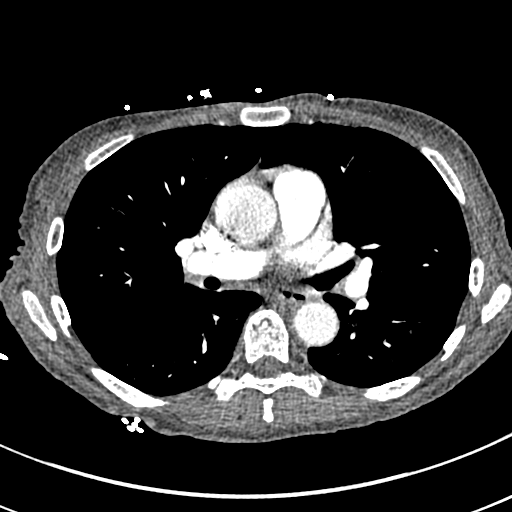
[im 196/321  lung]
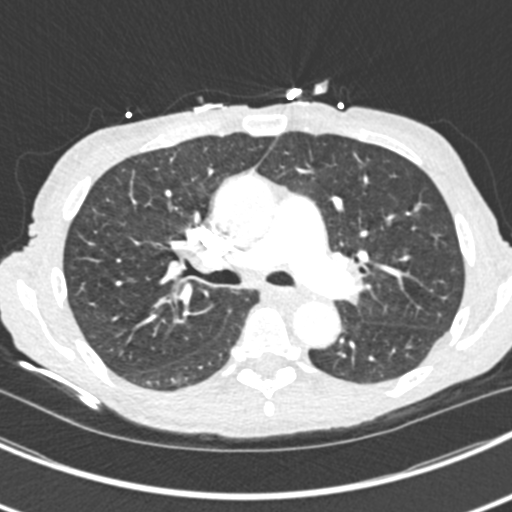
[im 214/321  mediastinal]
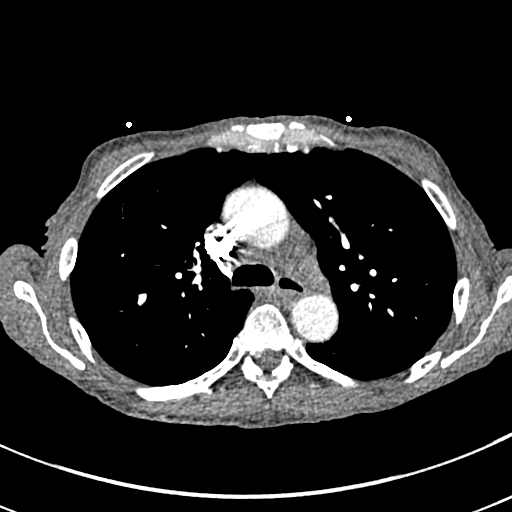
[im 232/321  lung]
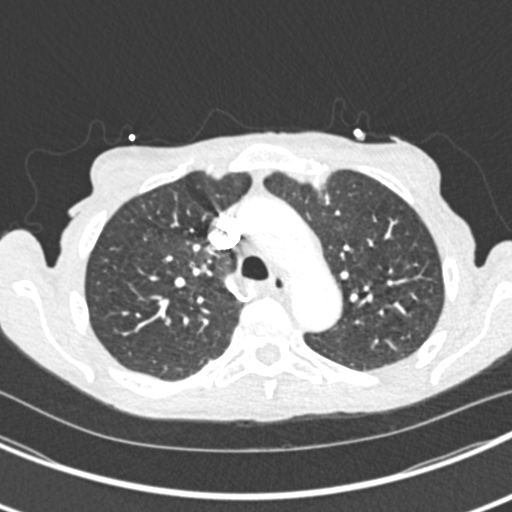
[im 249/321  mediastinal]
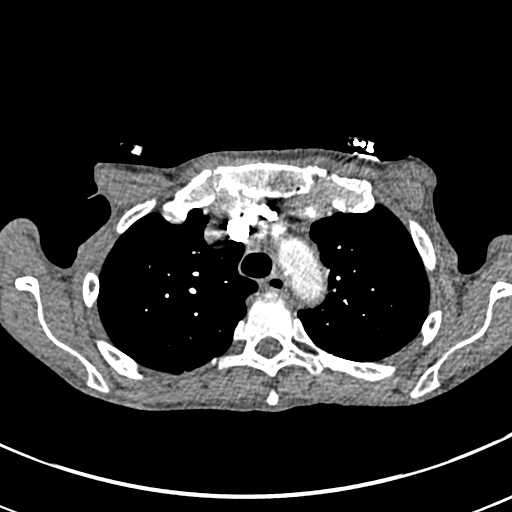
[im 267/321  lung]
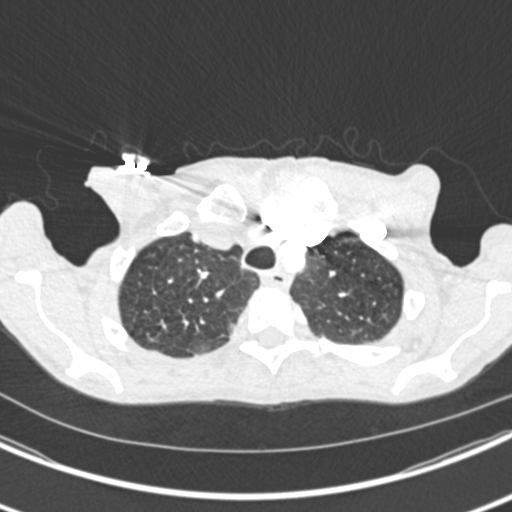
[im 285/321  mediastinal]
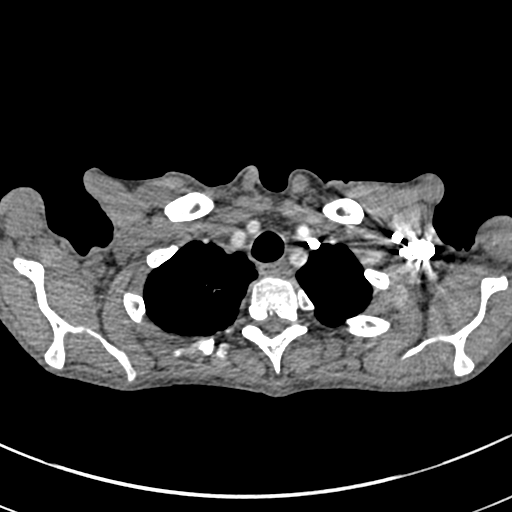
[im 303/321  lung]
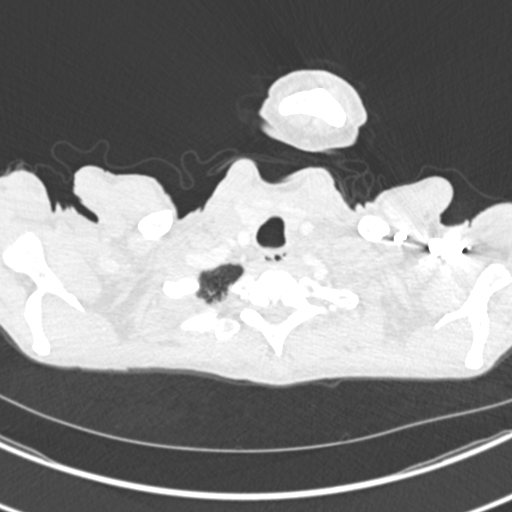

[Series 8: coronal mpr · coronal · 0.61mm/px · 1 of 107 slices shown]
[im 54/107  mediastinal]
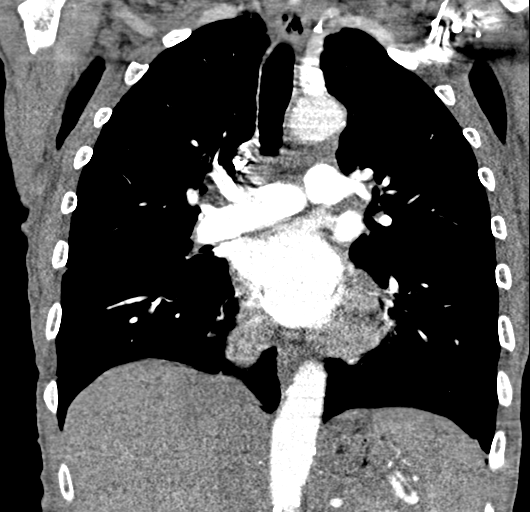

[18 of 36 positions shown; findings below may reference images not displayed]

FINDINGS: Cardiovascular: Adequate opacification of the pulmonary arterial
tree. No intraluminal filling defect identified to suggest acute
pulmonary embolism. Central pulmonary arteries are of normal
caliber. Mild coronary artery calcification. Cardiac size within
normal limits. Mild left ventricular hypertrophy noted. No
pericardial effusion. Mild atherosclerotic calcification within the
thoracic aorta. No aortic aneurysm.

Mediastinum/Nodes: No enlarged mediastinal, hilar, or axillary lymph
nodes. Thyroid gland, trachea, and esophagus demonstrate no
significant findings.

Lungs/Pleura: Mild interstitial pulmonary edema is present, possibly
cardiogenic in nature. There is diffuse bronchial wall thickening
noted which may relate to interstitial pulmonary edema or airway
inflammation. Mild emphysema. Scattered areas of airway impaction
are noted peripherally within the left lower lobe. No confluent
pulmonary infiltrate. No pneumothorax or pleural effusion.

Upper Abdomen: Surgical changes of probable gastric bypass are
identified within the upper abdomen. No acute abnormality.

Musculoskeletal: No chest wall abnormality. No acute or significant
osseous findings.

Review of the MIP images confirms the above findings.
IMPRESSION: No pulmonary embolism.

Mild coronary artery calcification.  Left ventricular hypertrophy.

Mild interstitial pulmonary edema, possibly cardiogenic in nature.

Mild emphysema. Scattered airway impaction and bronchial wall
thickening likely reflecting changes of superimposed airway
inflammation.

Aortic Atherosclerosis (3WNT6-K9Y.Y) and Emphysema ([id number]-OEU.Y).

## 2022-01-10 IMAGING — CT CT ANGIO CHEST
3 of 7 series · 18 of 36 positions shown · IV contrast (omnipaque)
Comparison: 05/28/2021.

CLINICAL DATA: Short of breath with chest pain on inspiration.
EBPTE-ZE infection 8 days ago.

EXAM:
CT ANGIOGRAPHY CHEST WITH CONTRAST
TECHNIQUE: Multidetector CT imaging of the chest was performed using the
standard protocol during bolus administration of intravenous
contrast. Multiplanar CT image reconstructions and MIPs were
obtained to evaluate the vascular anatomy.
CONTRAST:  75mL OMNIPAQUE IOHEXOL 350 MG/ML SOLN

[Series 4: thins · axial · 0.62mm/px · z∈[+1283,+1586]mm · 12 of 359 slices shown]
[im 28/359  lung]
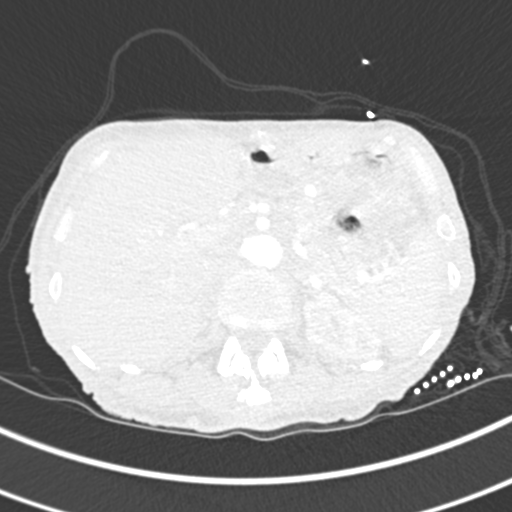
[im 56/359  mediastinal]
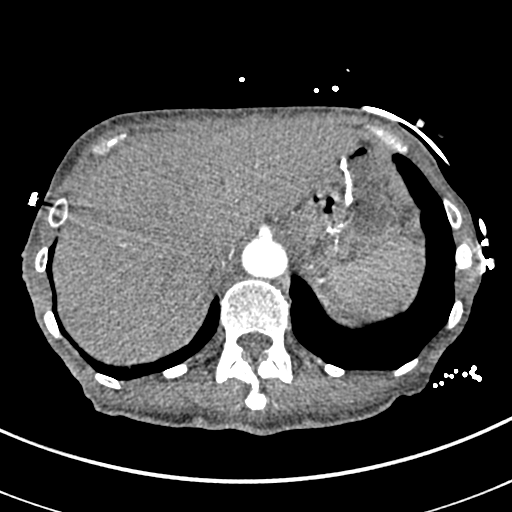
[im 83/359  lung]
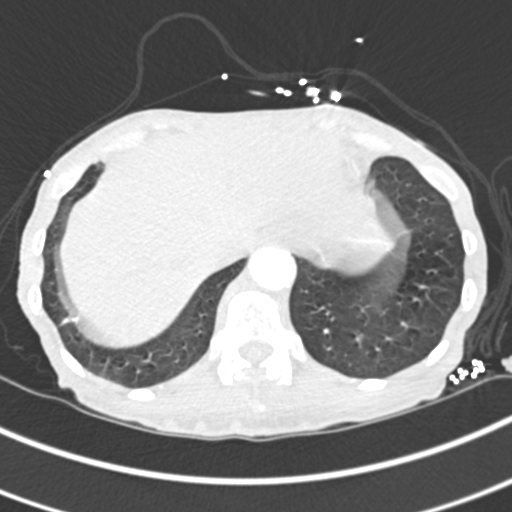
[im 111/359  mediastinal]
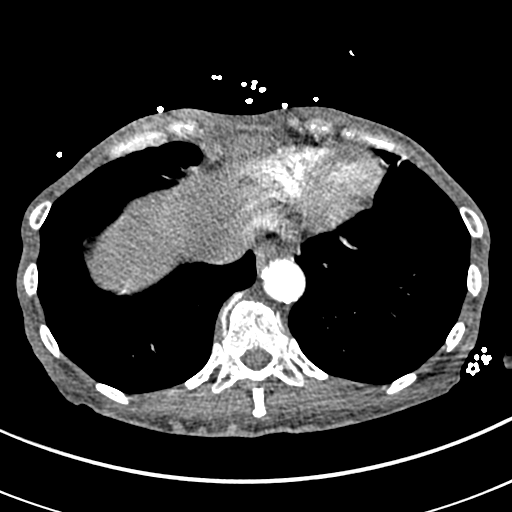
[im 138/359  lung]
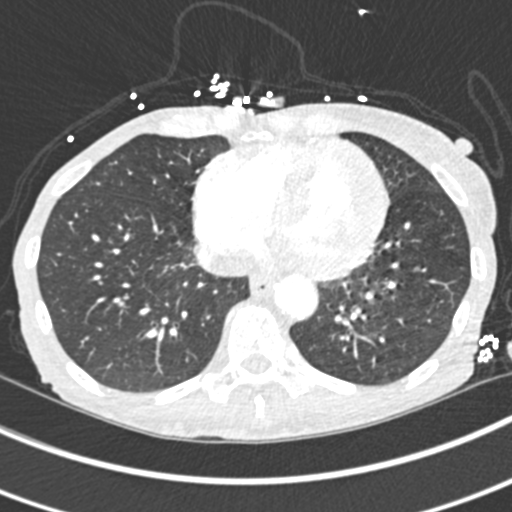
[im 166/359  mediastinal]
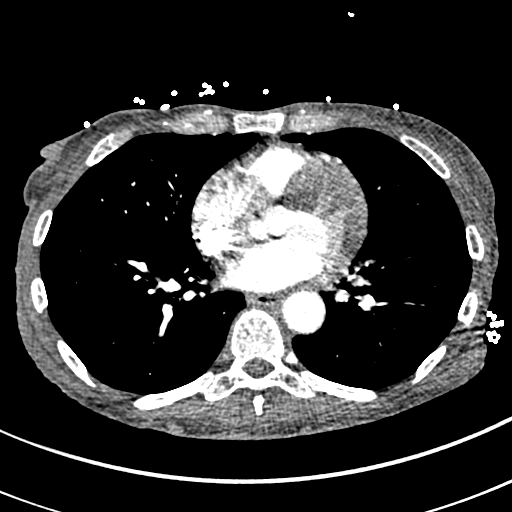
[im 193/359  lung]
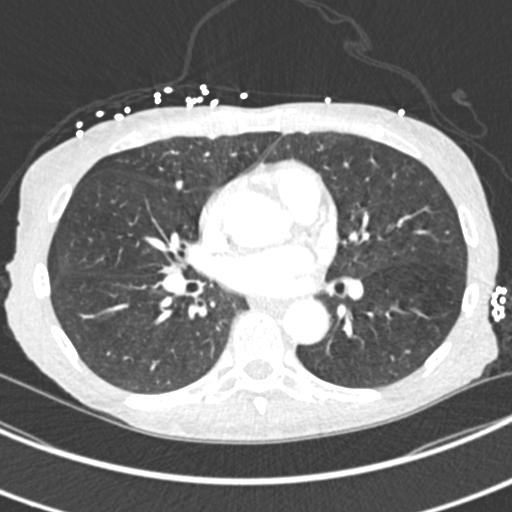
[im 221/359  mediastinal]
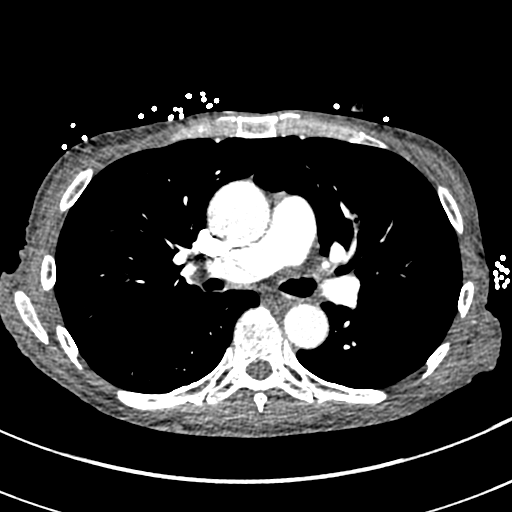
[im 248/359  lung]
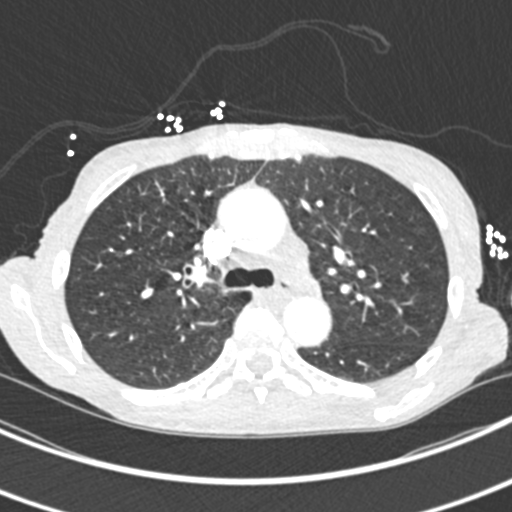
[im 276/359  mediastinal]
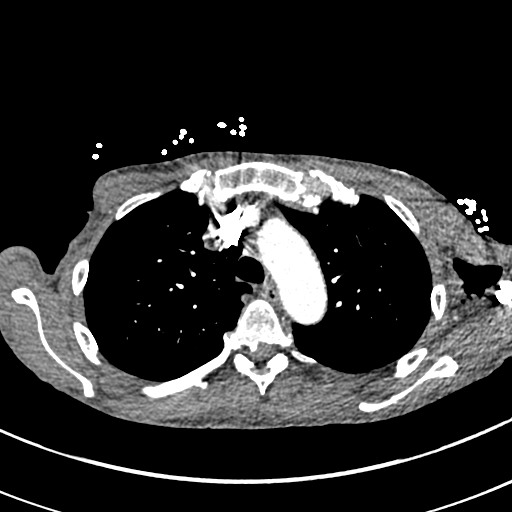
[im 303/359  lung]
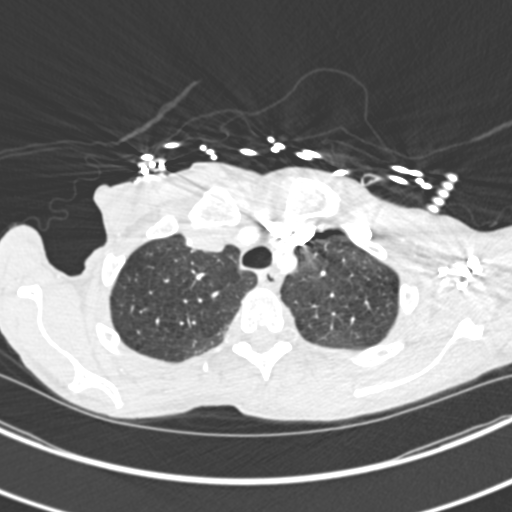
[im 331/359  mediastinal]
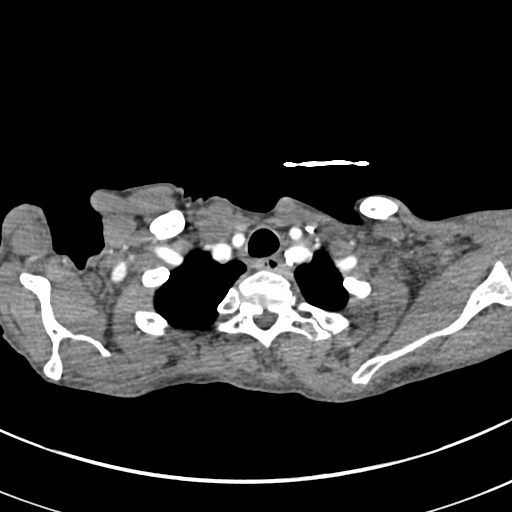

[Series 5: coronal mpr · coronal · 0.71mm/px · 1 of 108 slices shown]
[im 54/108  mediastinal]
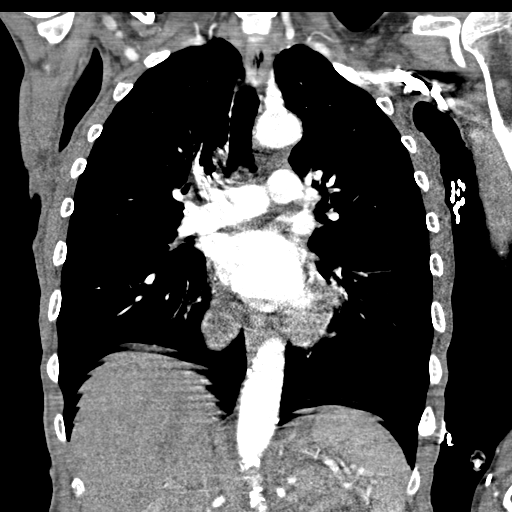

[Series 9: lung · axial · 0.57mm/px · z∈[+1334,+1558]mm · 5 of 168 slices shown]
[im 28/168  mediastinal]
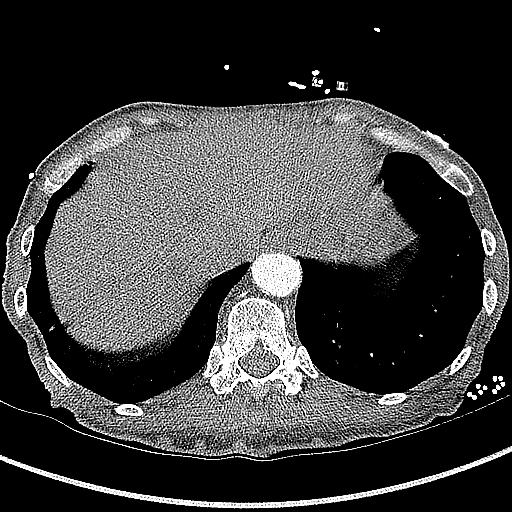
[im 56/168  mediastinal]
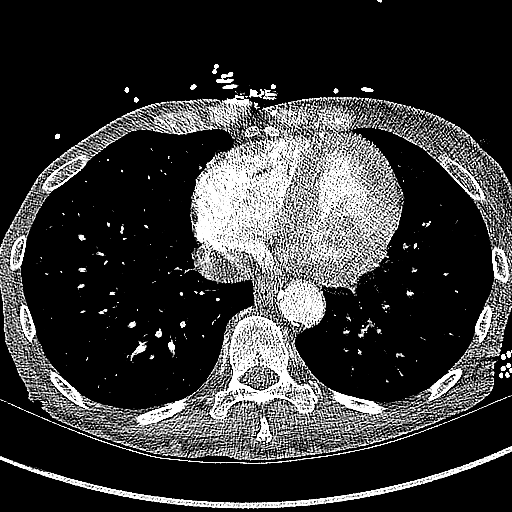
[im 84/168  mediastinal]
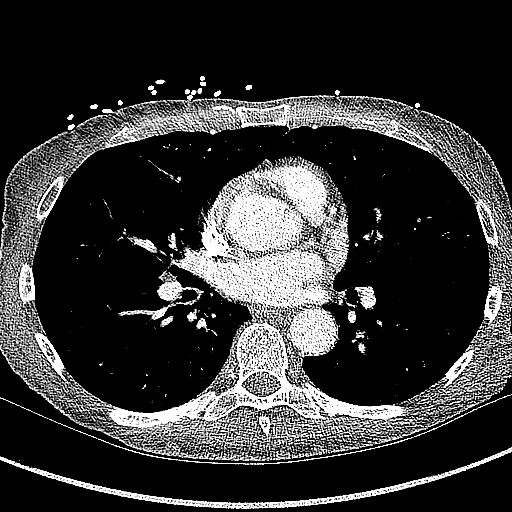
[im 112/168  mediastinal]
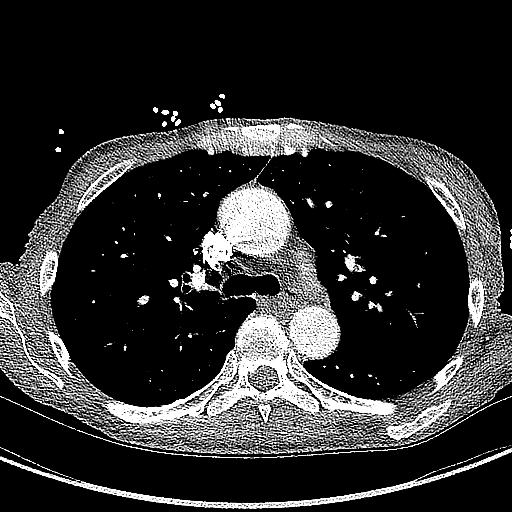
[im 140/168  mediastinal]
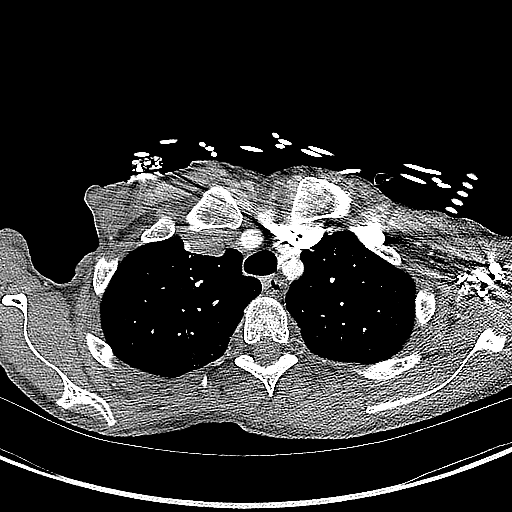

[18 of 36 positions shown; findings below may reference images not displayed]

FINDINGS: Cardiovascular: Central pulmonary arteries are well opacified. Mild
heterogeneous enhancement segmental and smaller pulmonary vessels
which, along with some respiratory motion, mildly limits the study.
Allowing for this mild limitation, there are no convincing pulmonary
emboli.

Heart normal in size. No pericardial effusion. Mild three-vessel
coronary artery calcifications. Great vessels are normal in caliber.
No aortic dissection. Mild descending thoracic aortic
atherosclerosis. Arch branch vessels are widely patent.

Mediastinum/Nodes: No enlarged mediastinal, hilar, or axillary lymph
nodes. Thyroid gland, trachea, and esophagus demonstrate no
significant findings.

Lungs/Pleura: Lungs are clear. No pleural effusion or pneumothorax.

Upper Abdomen: No acute findings.

Musculoskeletal: No fracture or acute finding. No bone lesion. No
chest wall masses.

Review of the MIP images confirms the above findings.
IMPRESSION: 1. No evidence of a pulmonary embolism. Study mildly limited for
assessment segmental and smaller pulmonary artery emboli.
2. No acute findings.  Clear lungs.

Aortic Atherosclerosis (UBM8K-R87.7).

## 2022-01-10 IMAGING — DX DG CHEST 1V PORT
1 series · 1 of 1 positions shown · non-contrast
Comparison: CTA chest 05/28/2021 and earlier.

CLINICAL DATA: 45-year-old female recently positive for [id number].
Shortness of breath.

EXAM:
PORTABLE CHEST 1 VIEW

[chest ap]
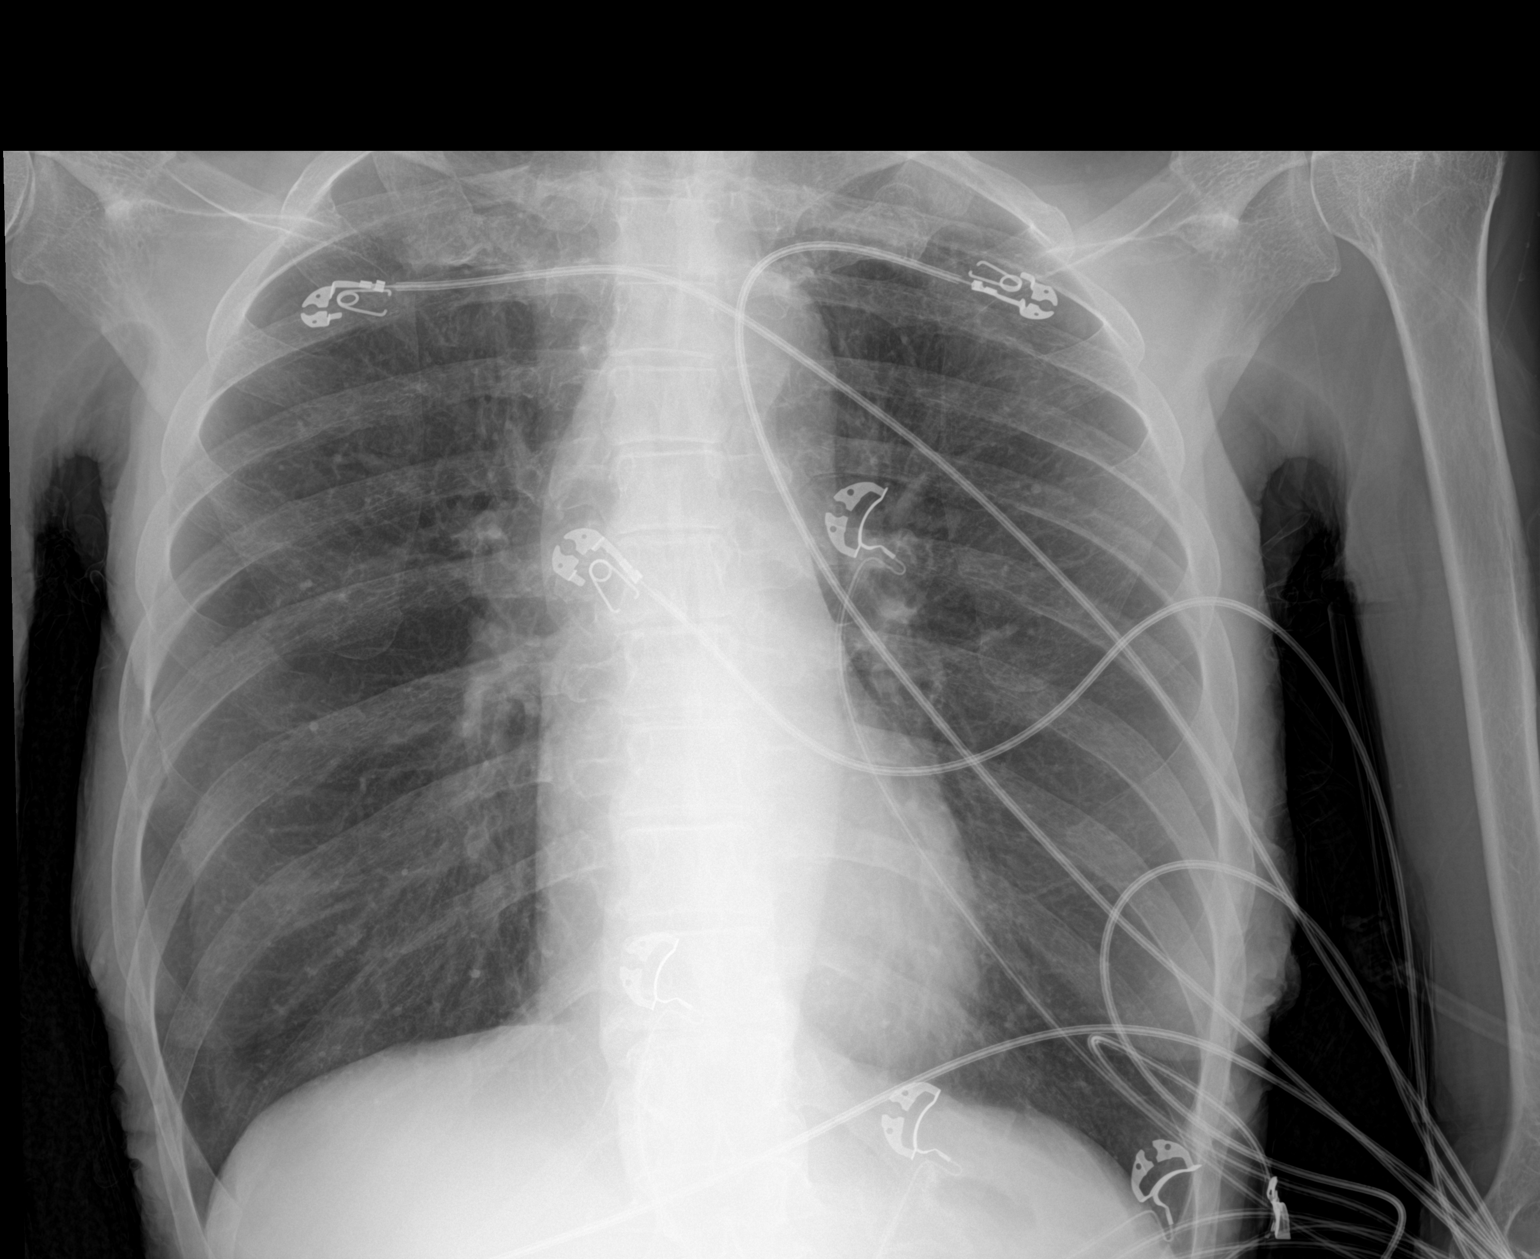

[1 of 1 positions shown; findings below may reference images not displayed]

FINDINGS: Portable AP semi upright view at 4612 hours. Emphysema on the recent
CT. Stable large lung volumes. Mediastinal contours remain within
normal limits. Visualized tracheal air column is within normal
limits. Allowing for portable technique the lungs are clear. No
pneumothorax or pleural effusion. No acute osseous abnormality
identified.
IMPRESSION: Emphysema.  No acute cardiopulmonary abnormality.

## 2022-01-20 ENCOUNTER — Encounter: Payer: Self-pay | Admitting: Internal Medicine

## 2022-02-03 IMAGING — CT CT T SPINE W/O CM
3 series · 14 of 33 positions shown, 17 images · IV contrast (OMNIPAQUE 350)
Comparison: CT angio chest 05/28/2021

CLINICAL DATA: Cold symptoms. Back pain and left lower quadrant
pain

EXAM:
CT THORACIC AND LUMBAR SPINE WITHOUT CONTRAST
TECHNIQUE: Multidetector CT imaging of the thoracic and lumbar spine was
performed without contrast. Multiplanar CT image reconstructions
were also generated.
RADIATION DOSE REDUCTION: This exam was performed according to the
departmental dose-optimization program which includes automated
exposure control, adjustment of the mA and/or kV according to
patient size and/or use of iterative reconstruction technique.

[Series 13: st tspine · axial · 0.30mm/px · z∈[+1326,+1578]mm · 6 of 165 slices shown, 8 images]
[im 26/165  soft-tissue]
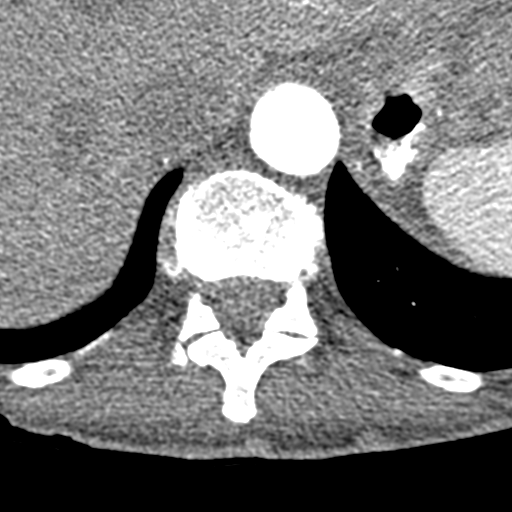
[im 26/165  bone]
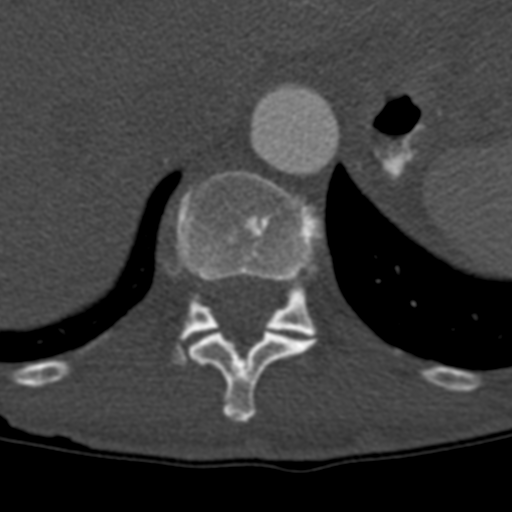
[im 51/165  bone]
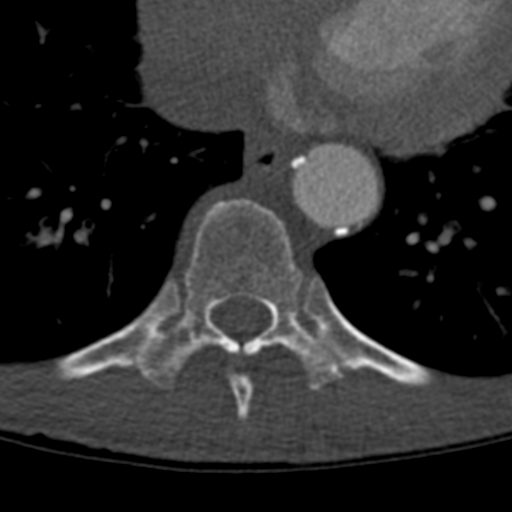
[im 76/165  bone]
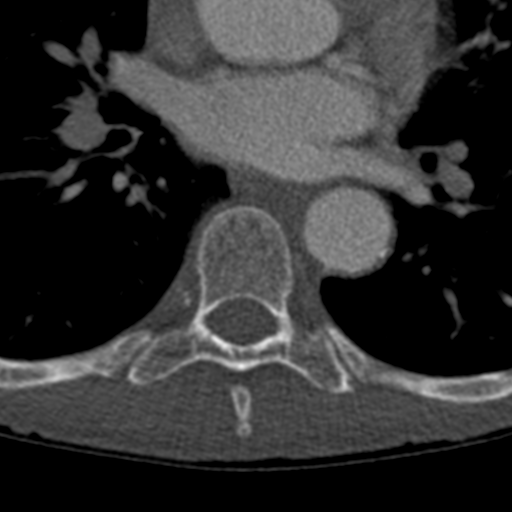
[im 101/165  bone]
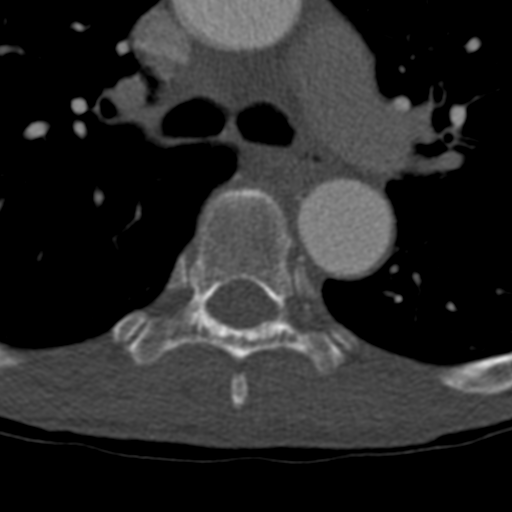
[im 127/165  soft-tissue]
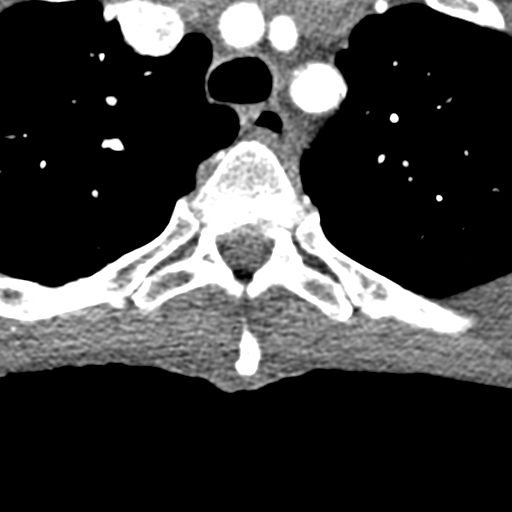
[im 127/165  bone]
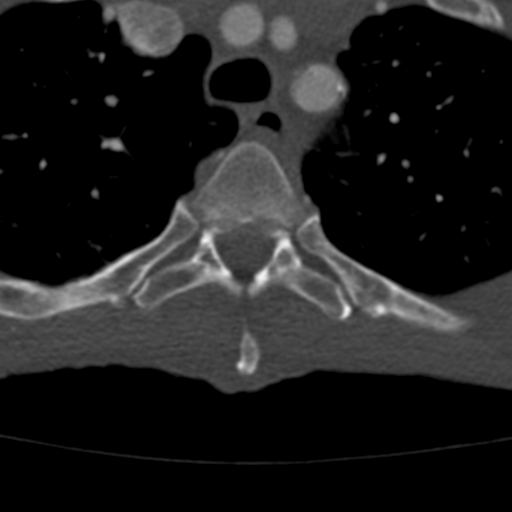
[im 152/165  bone]
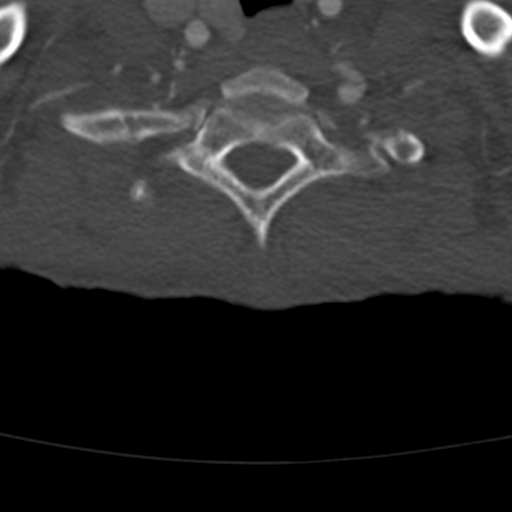

[Series 602: coronal · coronal · 0.64mm/px · 3 of 57 slices shown]
[im 12/57  bone]
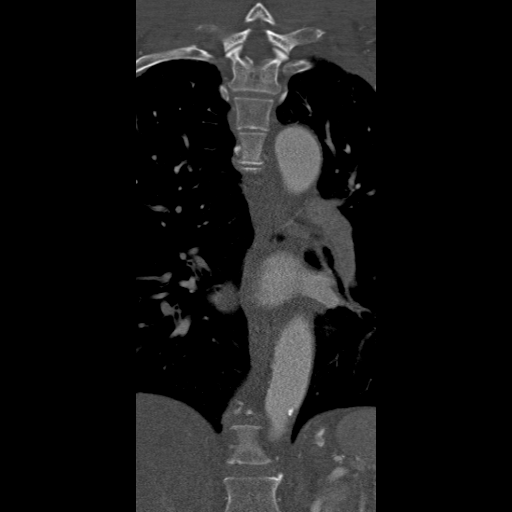
[im 23/57  bone]
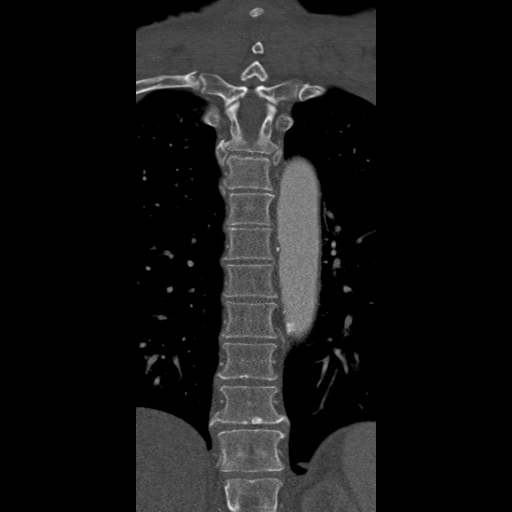
[im 34/57  bone]
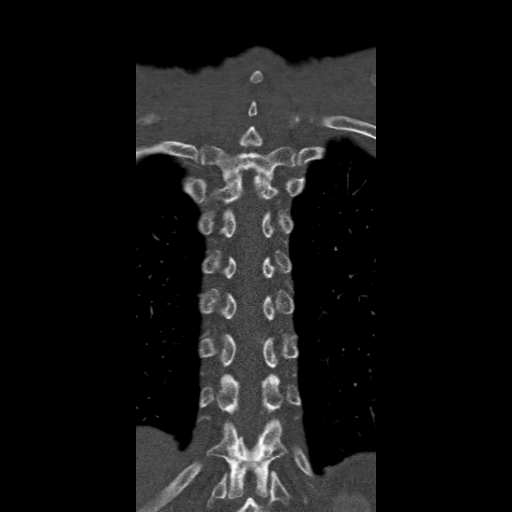

[Series 603: sagittal · sagittal · 0.64mm/px · 5 of 57 slices shown, 6 images]
[im 19/57  bone]
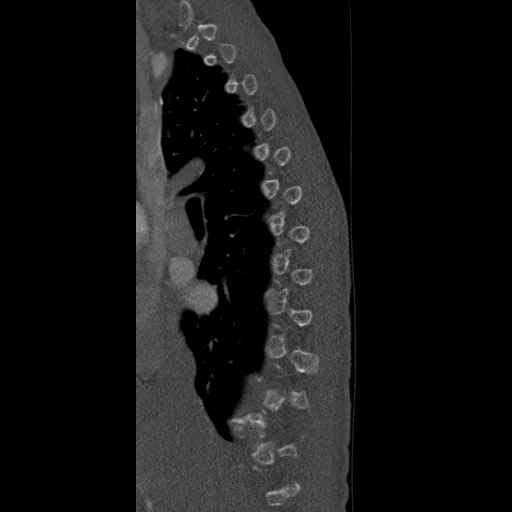
[im 24/57  bone]
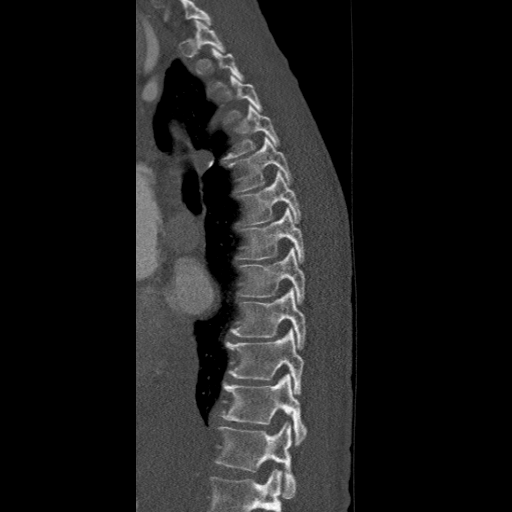
[im 29/57  soft-tissue]
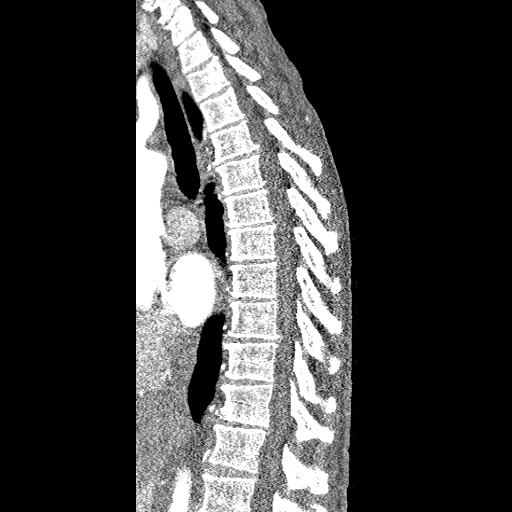
[im 29/57  bone]
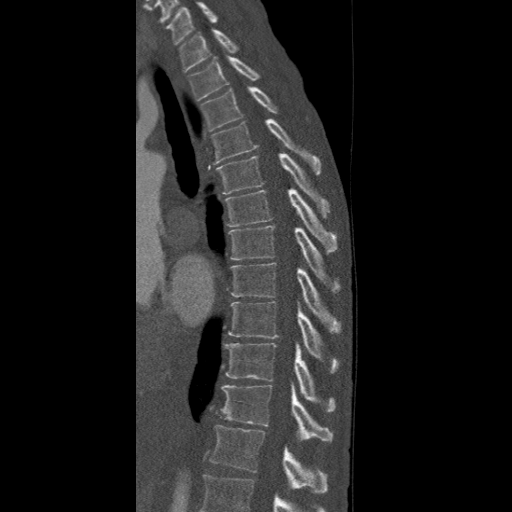
[im 33/57  bone]
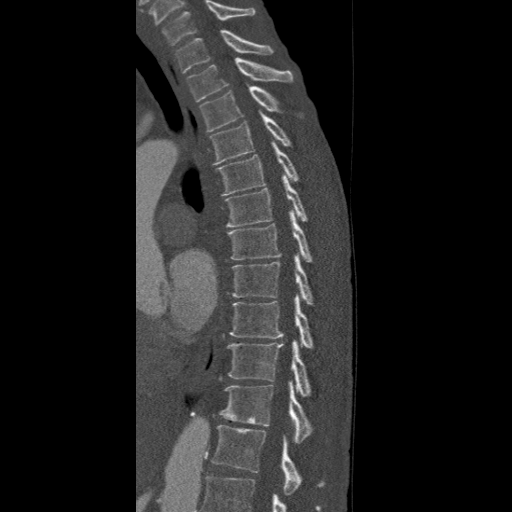
[im 38/57  bone]
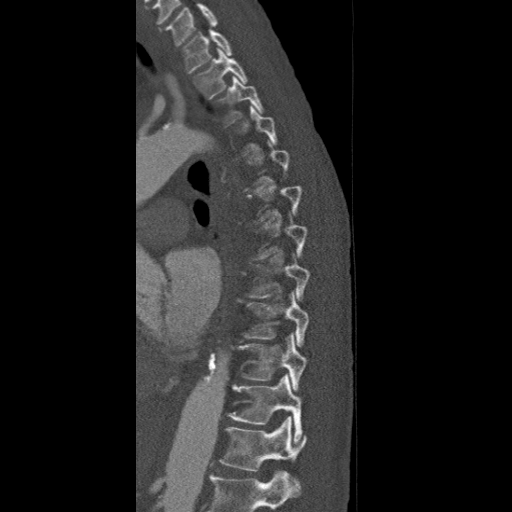

[14 of 33 positions shown; findings below may reference images not displayed]

FINDINGS: CT THORACIC SPINE FINDINGS

Alignment: Normal

Vertebrae: Negative for fracture or mass

Paraspinal and other soft tissues: Negative for paravertebral mass.
CT chest findings reported separately.

Disc levels: T1-2: Chronic calcified disc protrusion on the right.
No significant stenosis

T2-3: Small chronic calcific disc protrusion on the right. No
significant stenosis.

T3-4: Mild spurring on the right.  No significant stenosis

T4-5: Chronic calcified disc protrusion on the right with mild
mass-effect on the lateral thecal sac. No significant spinal or
foraminal stenosis

T5-6: Chronic calcified disc protrusion on the right without
significant stenosis.

T6-7: Chronic calcified disc protrusion on the right. No significant
stenosis

T7-8: Chronic calcified disc protrusion on the left. No significant
stenosis.

T8-9: Negative

T9-10: Chronic calcified disc protrusion on the left. No significant
spinal or foraminal stenosis.

T10-11: Negative

T11-12: Mild disc degeneration.  Negative for stenosis.

T12-L1: Negative

CT LUMBAR SPINE FINDINGS

Segmentation: 5 lumbar vertebra

Alignment: Normal

Vertebrae: Negative for fracture or mass. Extensive sclerotic
changes at L3-4 related to disc degeneration.

Paraspinal and other soft tissues: Negative for paraspinous mass. CT
abdomen pelvis reported separately.

Disc levels: L1-2: Negative

L2-3: Mild disc bulging and bilateral facet degeneration. Mild
spinal stenosis.

L3-4: Advanced disc degeneration with disc space narrowing and
extensive discogenic sclerosis. Diffuse endplate spurring and
bilateral facet degeneration. Laminotomy. Mild spinal stenosis.
Extruded disc fragment extending cranially just to the right of
midline is partially calcified and unchanged from MRI 9840. Mild
subarticular stenosis bilaterally

L4-5: Moderate disc degeneration with disc space narrowing, disc
bulging and diffuse endplate spurring. Gas in the disc space and in
the posterior annular region. Bilateral laminectomy. Spinal canal
decompressed. Mild subarticular stenosis bilaterally. No change from
the prior study.

L5-S1: Laminotomy with good decompression of thecal sac. Disc
degeneration with diffuse endplate spurring. Mild subarticular
stenosis bilaterally due to spurring.
IMPRESSION: CT THORACIC SPINE IMPRESSION

1. No acute abnormality
2. Multilevel disc degeneration throughout the thoracic spine
without significant spinal stenosis. Multiple small calcified disc
protrusions are similar to recent CT.

CT LUMBAR SPINE IMPRESSION

1. No acute abnormality lumbar spine
2. Multilevel lumbar degenerative change. Laminectomy L2-3 and L3-4.
Mild spinal stenosis L3-4. Mild subarticular stenosis bilaterally
L3-4, L4-5, and L5-S1.

## 2022-02-03 IMAGING — CT CT ANGIO CHEST-ABD-PELV FOR DISSECTION W/ AND WO/W CM
2 of 7 series · 12 of 46 positions shown, 14 images · non-contrast
Comparison: Chest CTA 05/30/2021 and 05/28/2021. Abdominopelvic CT
04/18/2021.

CLINICAL DATA: Cold symptoms with onset of abdominal pain and
diarrhea. Left lower quadrant pain and back pain. Acute aortic
syndrome suspected.

EXAM:
CT ANGIOGRAPHY CHEST, ABDOMEN AND PELVIS
TECHNIQUE: Non-contrast CT of the chest was initially obtained.

[Series 6: axial arterial · axial · arterial · 0.63mm/px · z∈[+1001,+1535]mm · 9 of 224 slices shown, 11 images]
[im 23/224  soft-tissue]
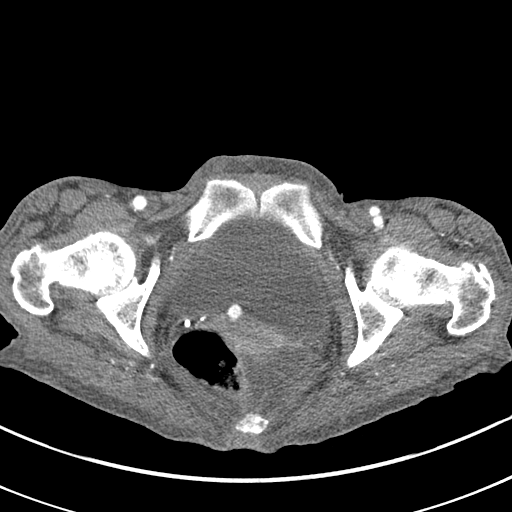
[im 23/224  bone]
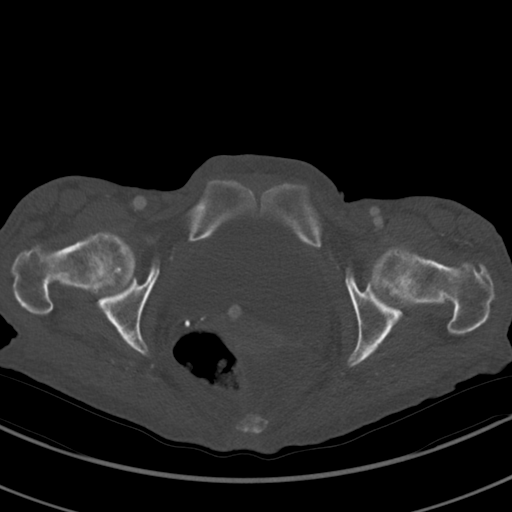
[im 45/224  soft-tissue]
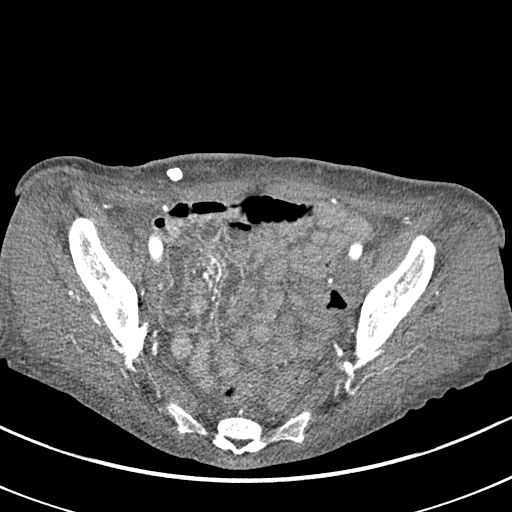
[im 67/224  soft-tissue]
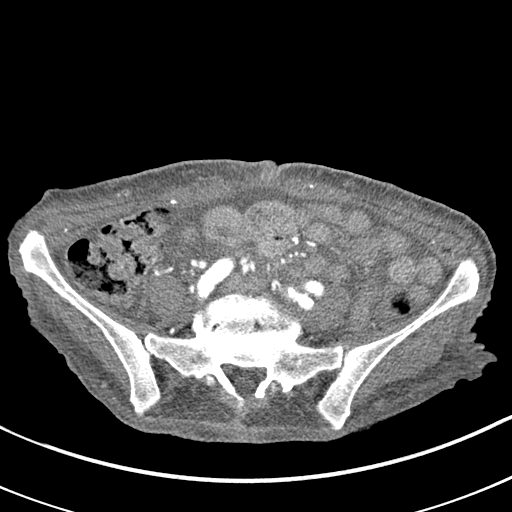
[im 90/224  soft-tissue]
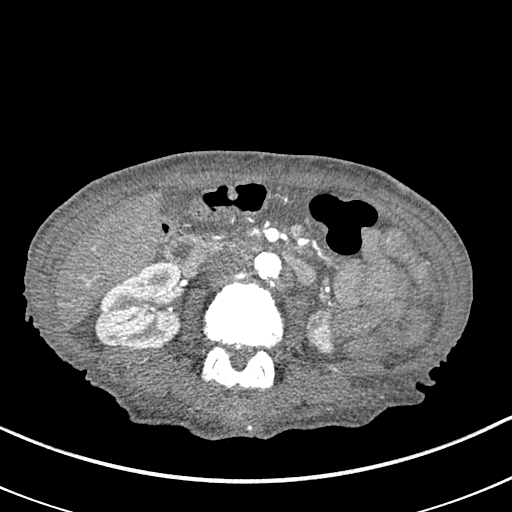
[im 112/224  soft-tissue]
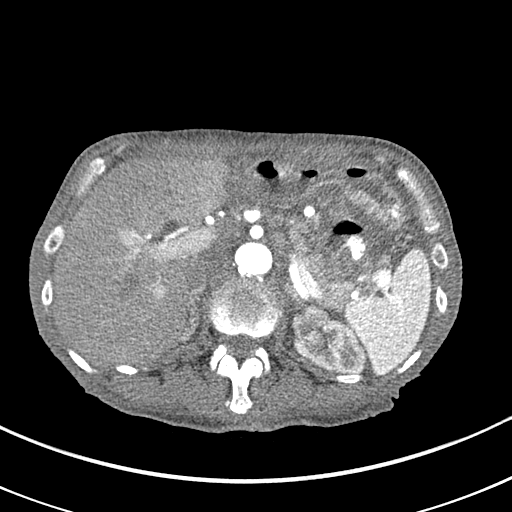
[im 134/224  soft-tissue]
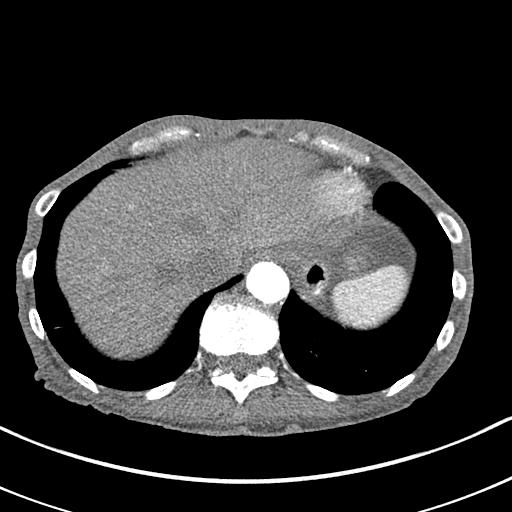
[im 157/224  soft-tissue]
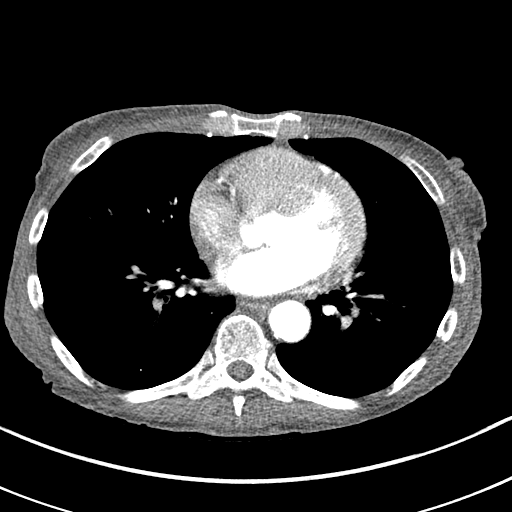
[im 179/224  soft-tissue]
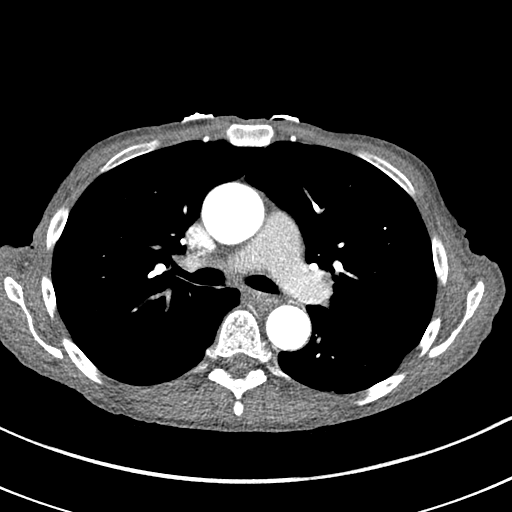
[im 201/224  soft-tissue]
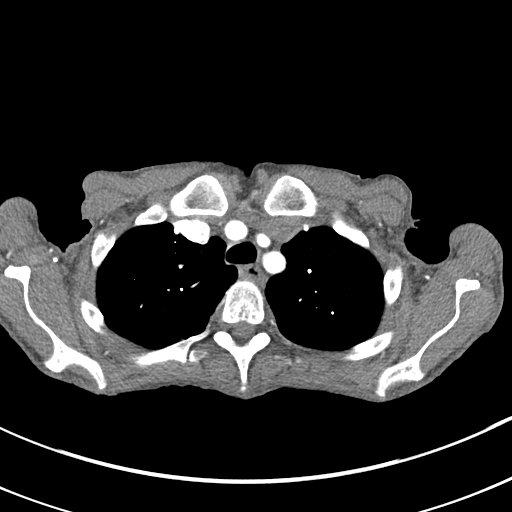
[im 201/224  bone]
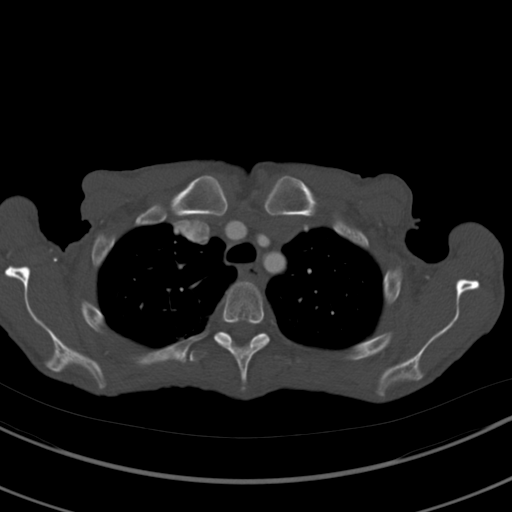

[Series 9: coronals · coronal · 0.78mm/px · 3 of 126 slices shown]
[im 32/126  soft-tissue]
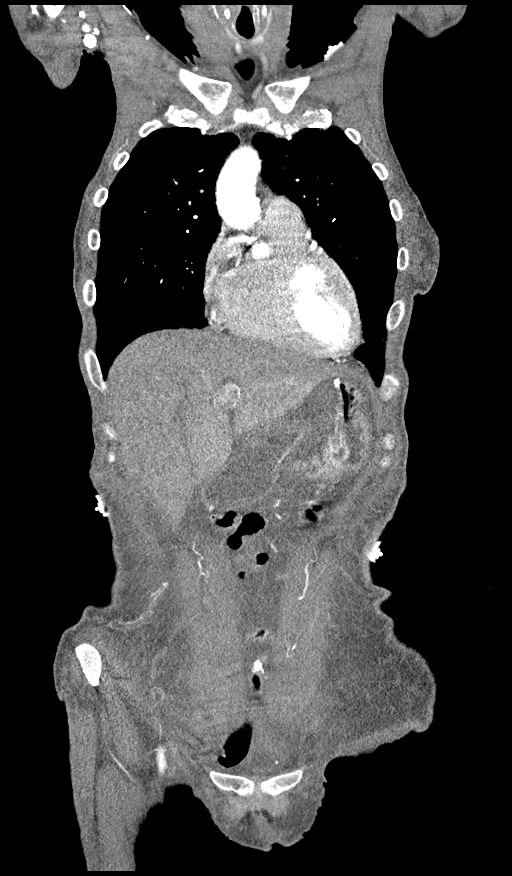
[im 63/126  soft-tissue]
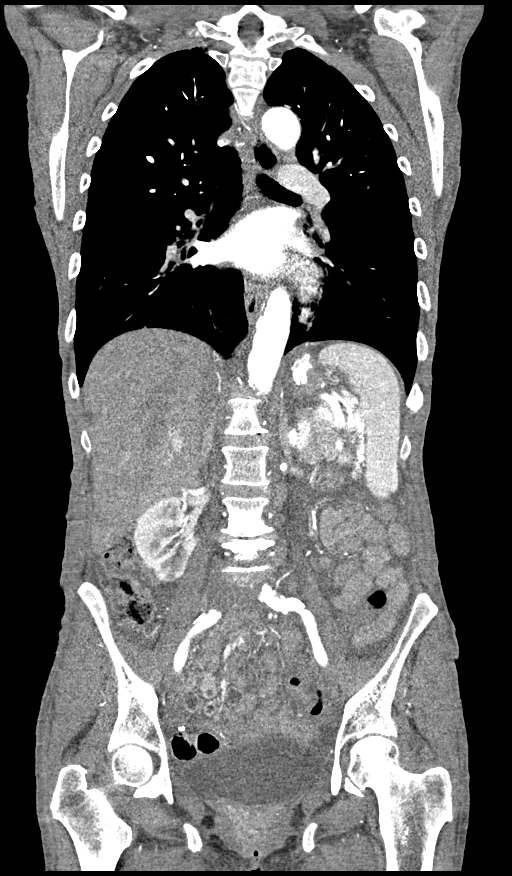
[im 94/126  soft-tissue]
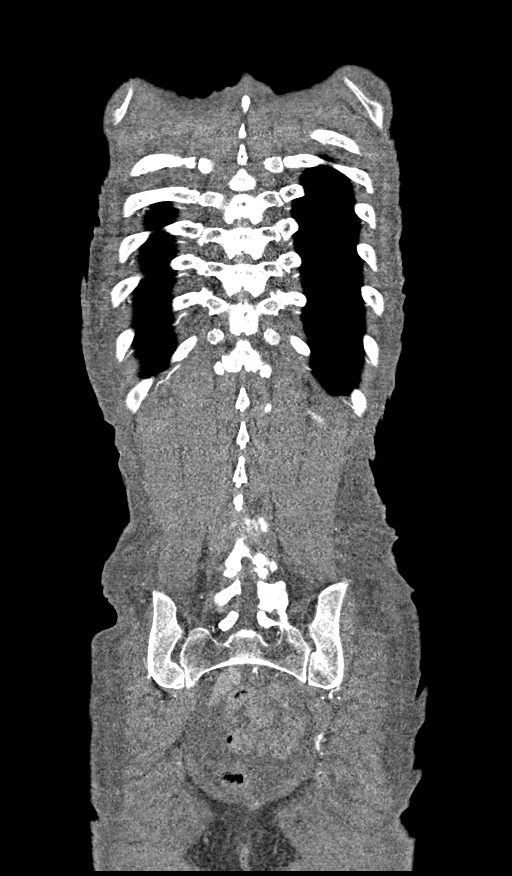

[12 of 46 positions shown; findings below may reference images not displayed]

Multidetector CT imaging through the chest, abdomen and pelvis was
performed using the standard protocol during bolus administration of
intravenous contrast. Multiplanar reconstructed images and MIPs were
obtained and reviewed to evaluate the vascular anatomy.

RADIATION DOSE REDUCTION: This exam was performed according to the
departmental dose-optimization program which includes automated
exposure control, adjustment of the mA and/or kV according to
patient size and/or use of iterative reconstruction technique.

CONTRAST:  80mL OMNIPAQUE IOHEXOL 350 MG/ML SOLN
FINDINGS: CTA CHEST FINDINGS

Cardiovascular: Contrast bolus timing was targeted to aortic
assessment. There is suboptimal opacification of the pulmonary
arteries. No evidence of acute pulmonary embolism. There is
atherosclerosis of the aorta, great vessels and coronary arteries.
No evidence of aortic dissection or aneurysm. The heart size is
normal. There is no pericardial effusion.

Mediastinum/Nodes: There are no enlarged mediastinal, hilar or
axillary lymph nodes. The thyroid gland, trachea and esophagus
demonstrate no significant findings.

Lungs/Pleura: No pleural effusion or pneumothorax. Centrilobular
emphysema with mild central airway thickening and scattered linear
scarring at both lung bases. No suspicious pulmonary nodules.

Musculoskeletal/Chest wall: There is no chest wall mass or
suspicious osseous finding. Healed fracture of the left 9th rib
laterally. Mild thoracic spondylosis.

Review of the MIP images confirms the above findings.

CTA ABDOMEN AND PELVIS FINDINGS

VASCULAR

Aorta: Normal caliber aorta without aneurysm, dissection, vasculitis
or significant stenosis. Age advanced atherosclerosis.

Celiac: Patent without evidence of aneurysm, dissection, vasculitis
or significant stenosis.

SMA: Patent without evidence of aneurysm, dissection, vasculitis or
significant stenosis.

Renals: Both renal arteries are patent without evidence of aneurysm,
dissection, vasculitis, fibromuscular dysplasia or significant
stenosis.

IMA: Patent without evidence of aneurysm, dissection, vasculitis or
significant stenosis.

Inflow: Patent without evidence of aneurysm, dissection, vasculitis
or significant stenosis. Age advanced atherosclerosis.

Veins: Limited venous opacification.  No apparent abnormality.

Review of the MIP images confirms the above findings.

NON-VASCULAR

Hepatobiliary: No suspicious findings as imaged in the arterial
phase. Subcapsular cyst peripherally in the right hepatic lobe is
unchanged. Stable mild biliary dilatation status post
cholecystectomy.

Pancreas: Unremarkable. No pancreatic ductal dilatation or
surrounding inflammatory changes.

Spleen: Normal in size without focal abnormality.

Adrenals/Urinary Tract: Both adrenal glands appear normal. There is
cortical scarring of both kidneys. No evidence of renal or ureteral
calculus. There is no hydronephrosis or perinephric soft tissue
stranding. 8 mm calcified lesion along the posterior wall of the
bladder has mildly enlarged compared with the previous
abdominopelvic CT, probably a bladder calculus, although potentially
a calcified lesion in the posterior bladder wall. No other bladder
abnormality identified.

Stomach/Bowel: No enteric contrast administered. Grossly stable
postsurgical changes from previous gastric bypass procedure. No
bowel wall thickening or significant distention identified.

Lymphatic: Nodal assessment limited by paucity of intra-abdominal
fat. No enlarged abdominopelvic lymph nodes identified.

Reproductive: The uterus and ovaries appear unremarkable. No adnexal
mass.

Other: Paucity of intra-abdominal fat with generalized edema
throughout the subcutaneous and intra-abdominal fat. There is a
small amount of ascites. No focal fluid collection or free air
identified.

Musculoskeletal: No acute osseous findings. Multilevel spondylosis
status post multilevel posterior decompression.

Review of the MIP images confirms the above findings.
IMPRESSION: 1. No acute vascular findings identified within the chest, abdomen
or pelvis.
2. Age advanced coronary and Aortic Atherosclerosis ([id number]).
No evidence of large vessel occlusion or significant vascular
stenosis.
3.  Emphysema ([id number]).
4. Bilateral renal cortical scarring. Suspected enlarging calculus
dependently in the bladder versus partially calcified lesion in the
posterior wall of the bladder.
5. Generalized soft tissue edema and ascites suspicious for
anasarca.

## 2022-02-03 IMAGING — CT CT L SPINE W/O CM
3 series · 14 of 33 positions shown, 17 images · non-contrast
Comparison: CT angio chest 05/28/2021

CLINICAL DATA: Cold symptoms. Back pain and left lower quadrant
pain

EXAM:
CT THORACIC AND LUMBAR SPINE WITHOUT CONTRAST
TECHNIQUE: Multidetector CT imaging of the thoracic and lumbar spine was
performed without contrast. Multiplanar CT image reconstructions
were also generated.
RADIATION DOSE REDUCTION: This exam was performed according to the
departmental dose-optimization program which includes automated
exposure control, adjustment of the mA and/or kV according to
patient size and/or use of iterative reconstruction technique.

[Series 1: st lspine · axial · 0.30mm/px · z∈[+1129,+1303]mm · 6 of 114 slices shown, 8 images]
[im 18/114  soft-tissue]
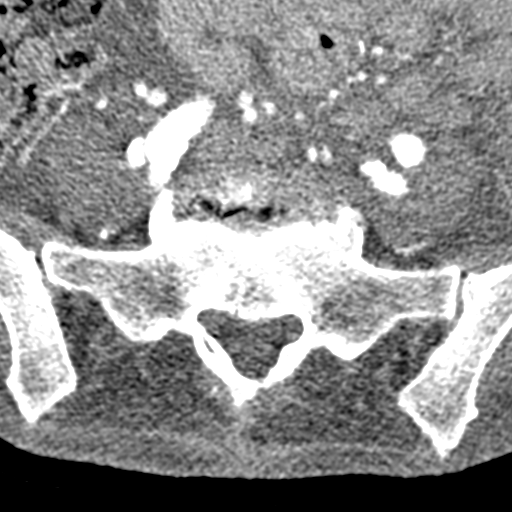
[im 18/114  bone]
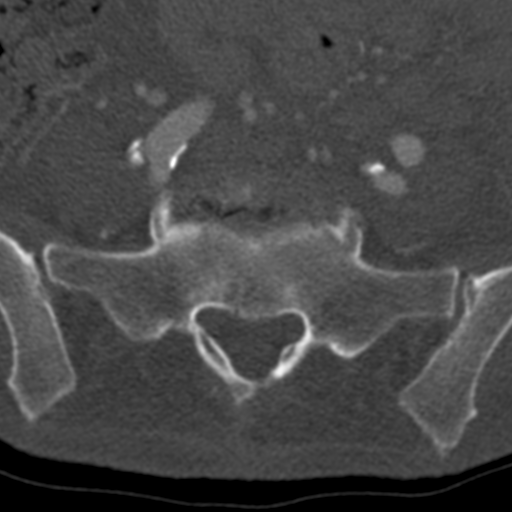
[im 35/114  bone]
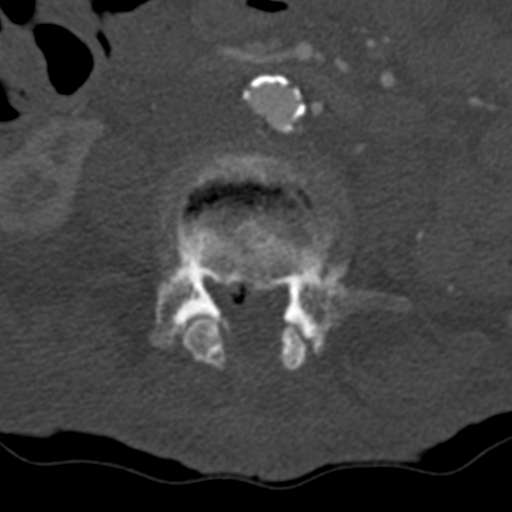
[im 53/114  bone]
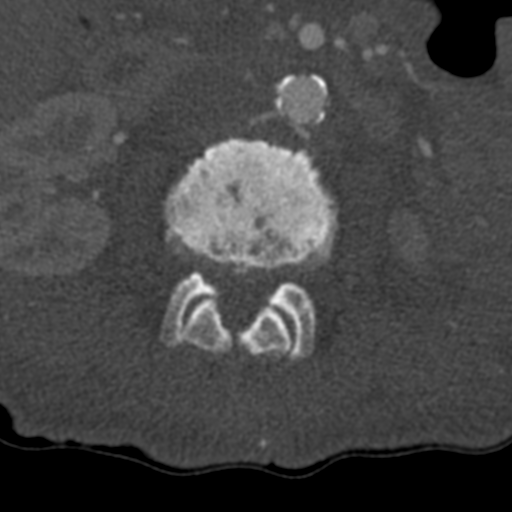
[im 70/114  bone]
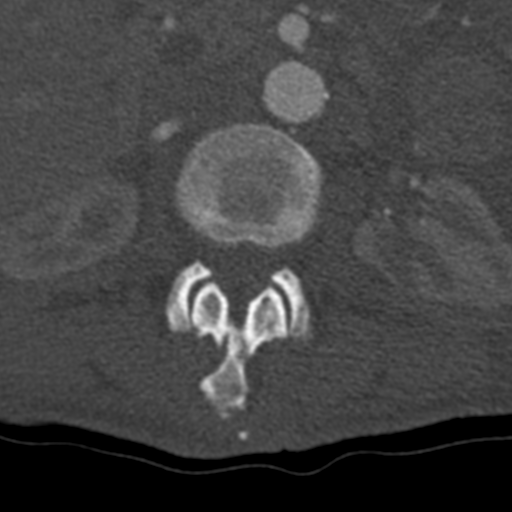
[im 87/114  soft-tissue]
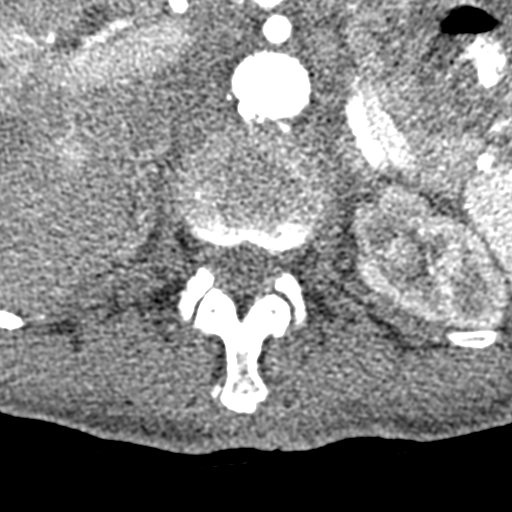
[im 87/114  bone]
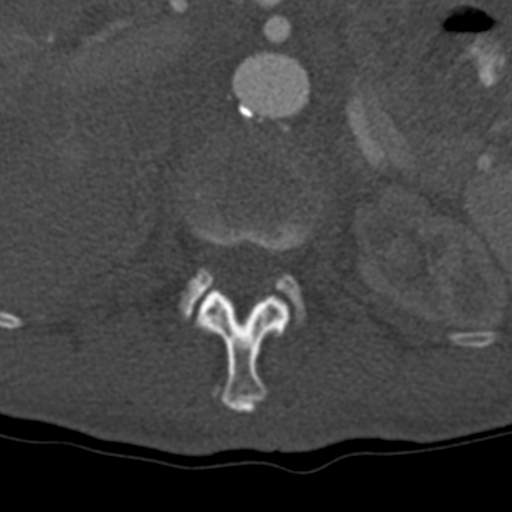
[im 105/114  bone]
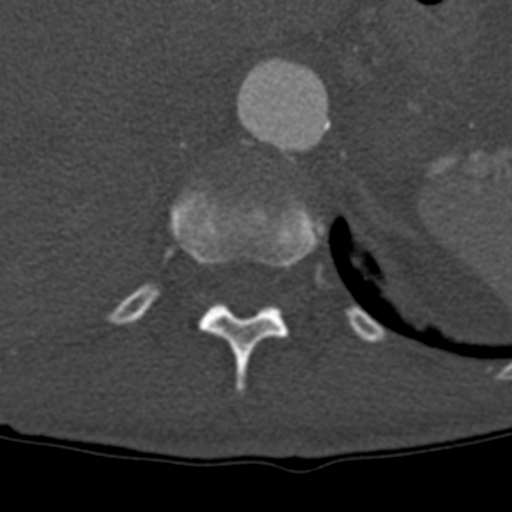

[Series 503: st coronal lspine · coronal · 0.45mm/px · 3 of 49 slices shown]
[im 10/49  bone]
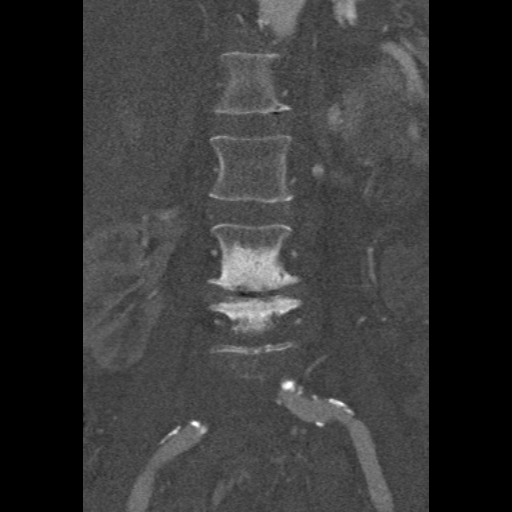
[im 20/49  bone]
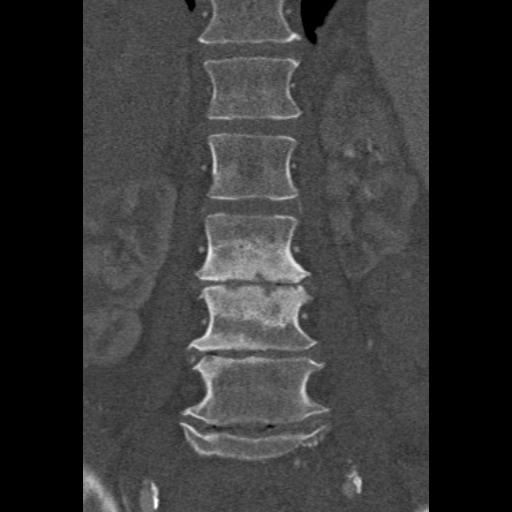
[im 29/49  bone]
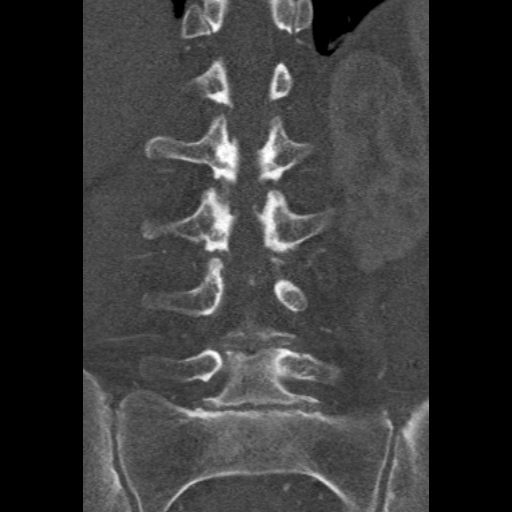

[Series 504: lspine st sag · sagittal · 0.45mm/px · 5 of 51 slices shown, 6 images]
[im 17/51  bone]
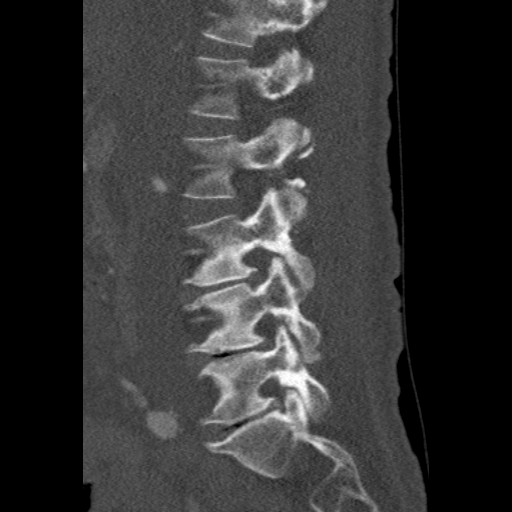
[im 21/51  bone]
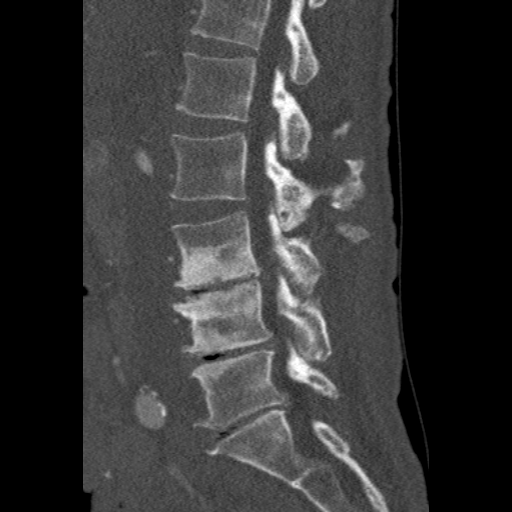
[im 26/51  soft-tissue]
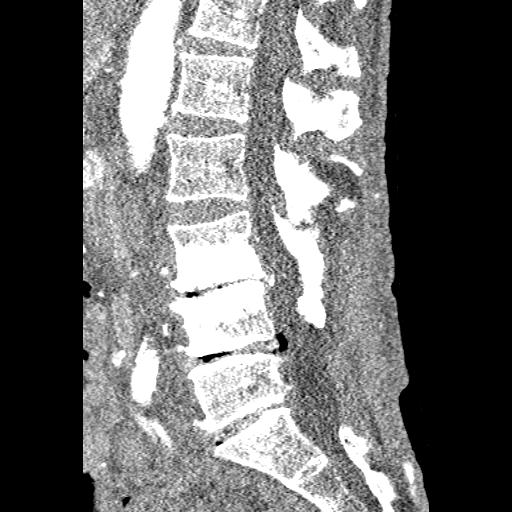
[im 26/51  bone]
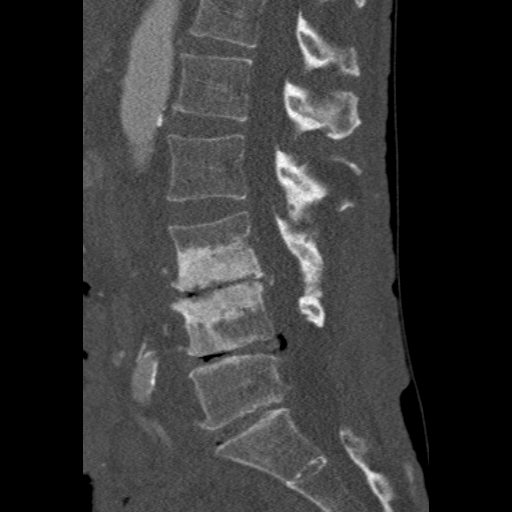
[im 30/51  bone]
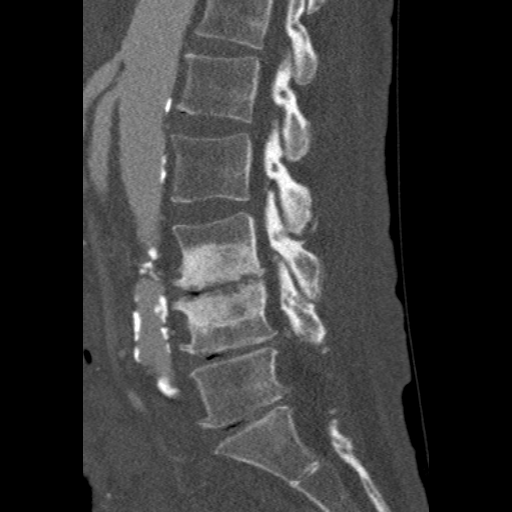
[im 34/51  bone]
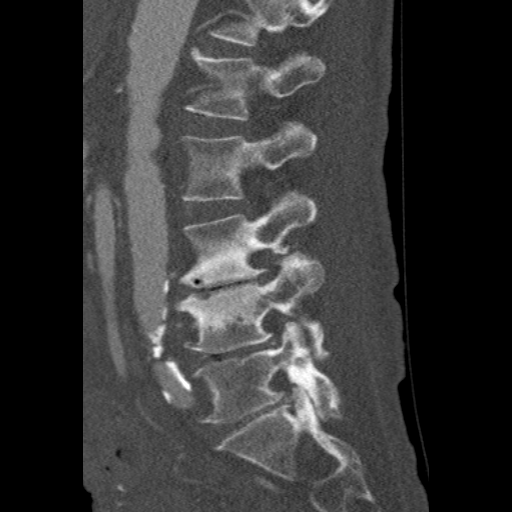

[14 of 33 positions shown; findings below may reference images not displayed]

FINDINGS: CT THORACIC SPINE FINDINGS

Alignment: Normal

Vertebrae: Negative for fracture or mass

Paraspinal and other soft tissues: Negative for paravertebral mass.
CT chest findings reported separately.

Disc levels: T1-2: Chronic calcified disc protrusion on the right.
No significant stenosis

T2-3: Small chronic calcific disc protrusion on the right. No
significant stenosis.

T3-4: Mild spurring on the right.  No significant stenosis

T4-5: Chronic calcified disc protrusion on the right with mild
mass-effect on the lateral thecal sac. No significant spinal or
foraminal stenosis

T5-6: Chronic calcified disc protrusion on the right without
significant stenosis.

T6-7: Chronic calcified disc protrusion on the right. No significant
stenosis

T7-8: Chronic calcified disc protrusion on the left. No significant
stenosis.

T8-9: Negative

T9-10: Chronic calcified disc protrusion on the left. No significant
spinal or foraminal stenosis.

T10-11: Negative

T11-12: Mild disc degeneration.  Negative for stenosis.

T12-L1: Negative

CT LUMBAR SPINE FINDINGS

Segmentation: 5 lumbar vertebra

Alignment: Normal

Vertebrae: Negative for fracture or mass. Extensive sclerotic
changes at L3-4 related to disc degeneration.

Paraspinal and other soft tissues: Negative for paraspinous mass. CT
abdomen pelvis reported separately.

Disc levels: L1-2: Negative

L2-3: Mild disc bulging and bilateral facet degeneration. Mild
spinal stenosis.

L3-4: Advanced disc degeneration with disc space narrowing and
extensive discogenic sclerosis. Diffuse endplate spurring and
bilateral facet degeneration. Laminotomy. Mild spinal stenosis.
Extruded disc fragment extending cranially just to the right of
midline is partially calcified and unchanged from MRI 9840. Mild
subarticular stenosis bilaterally

L4-5: Moderate disc degeneration with disc space narrowing, disc
bulging and diffuse endplate spurring. Gas in the disc space and in
the posterior annular region. Bilateral laminectomy. Spinal canal
decompressed. Mild subarticular stenosis bilaterally. No change from
the prior study.

L5-S1: Laminotomy with good decompression of thecal sac. Disc
degeneration with diffuse endplate spurring. Mild subarticular
stenosis bilaterally due to spurring.
IMPRESSION: CT THORACIC SPINE IMPRESSION

1. No acute abnormality
2. Multilevel disc degeneration throughout the thoracic spine
without significant spinal stenosis. Multiple small calcified disc
protrusions are similar to recent CT.

CT LUMBAR SPINE IMPRESSION

1. No acute abnormality lumbar spine
2. Multilevel lumbar degenerative change. Laminectomy L2-3 and L3-4.
Mild spinal stenosis L3-4. Mild subarticular stenosis bilaterally
L3-4, L4-5, and L5-S1.

## 2022-02-04 IMAGING — CR DG CHEST 2V
2 series · 2 of 2 positions shown · non-contrast
Comparison: Chest x-ray 05/30/2021

CLINICAL DATA: cp

EXAM:
CHEST - 2 VIEW

[w chest pa]
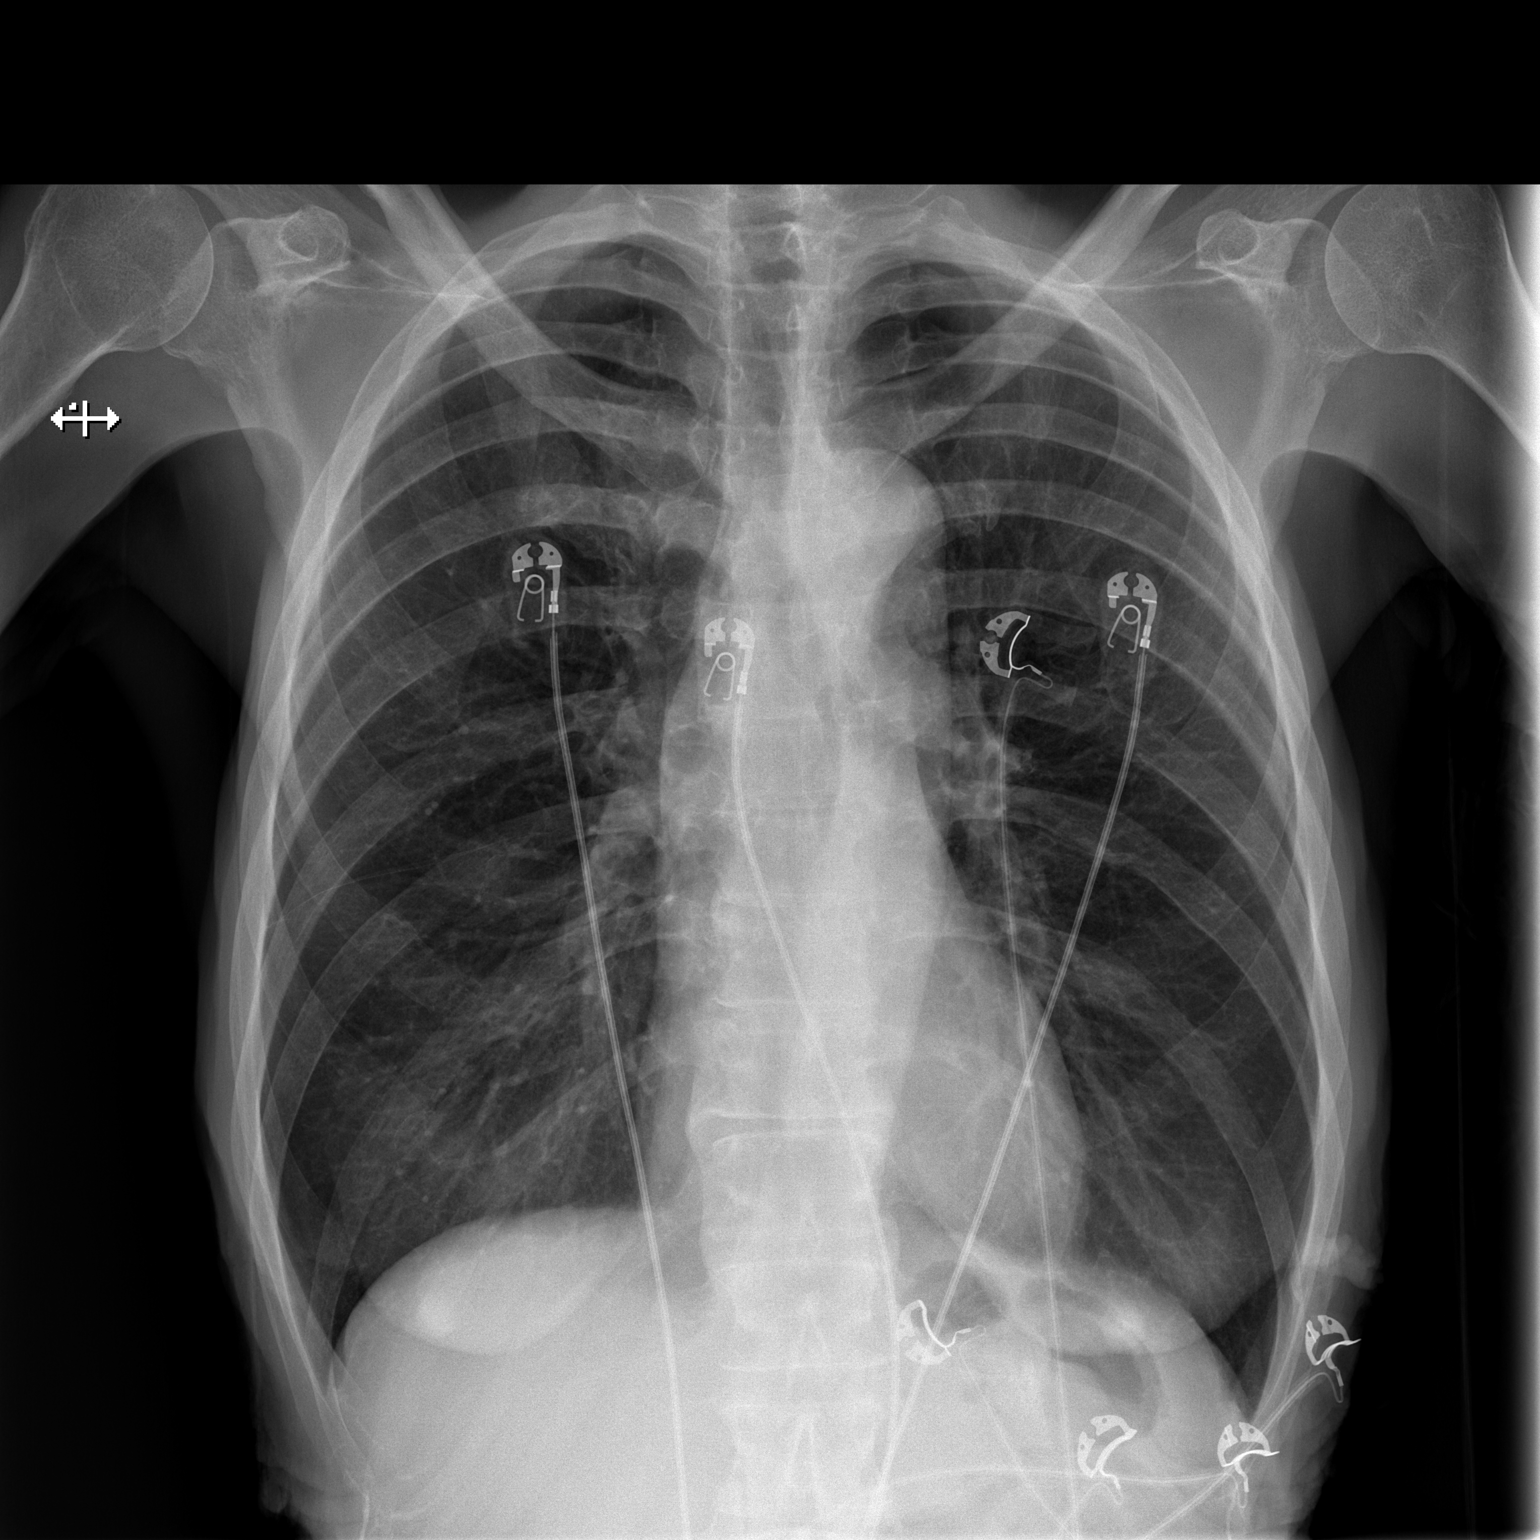

[w chest lat]
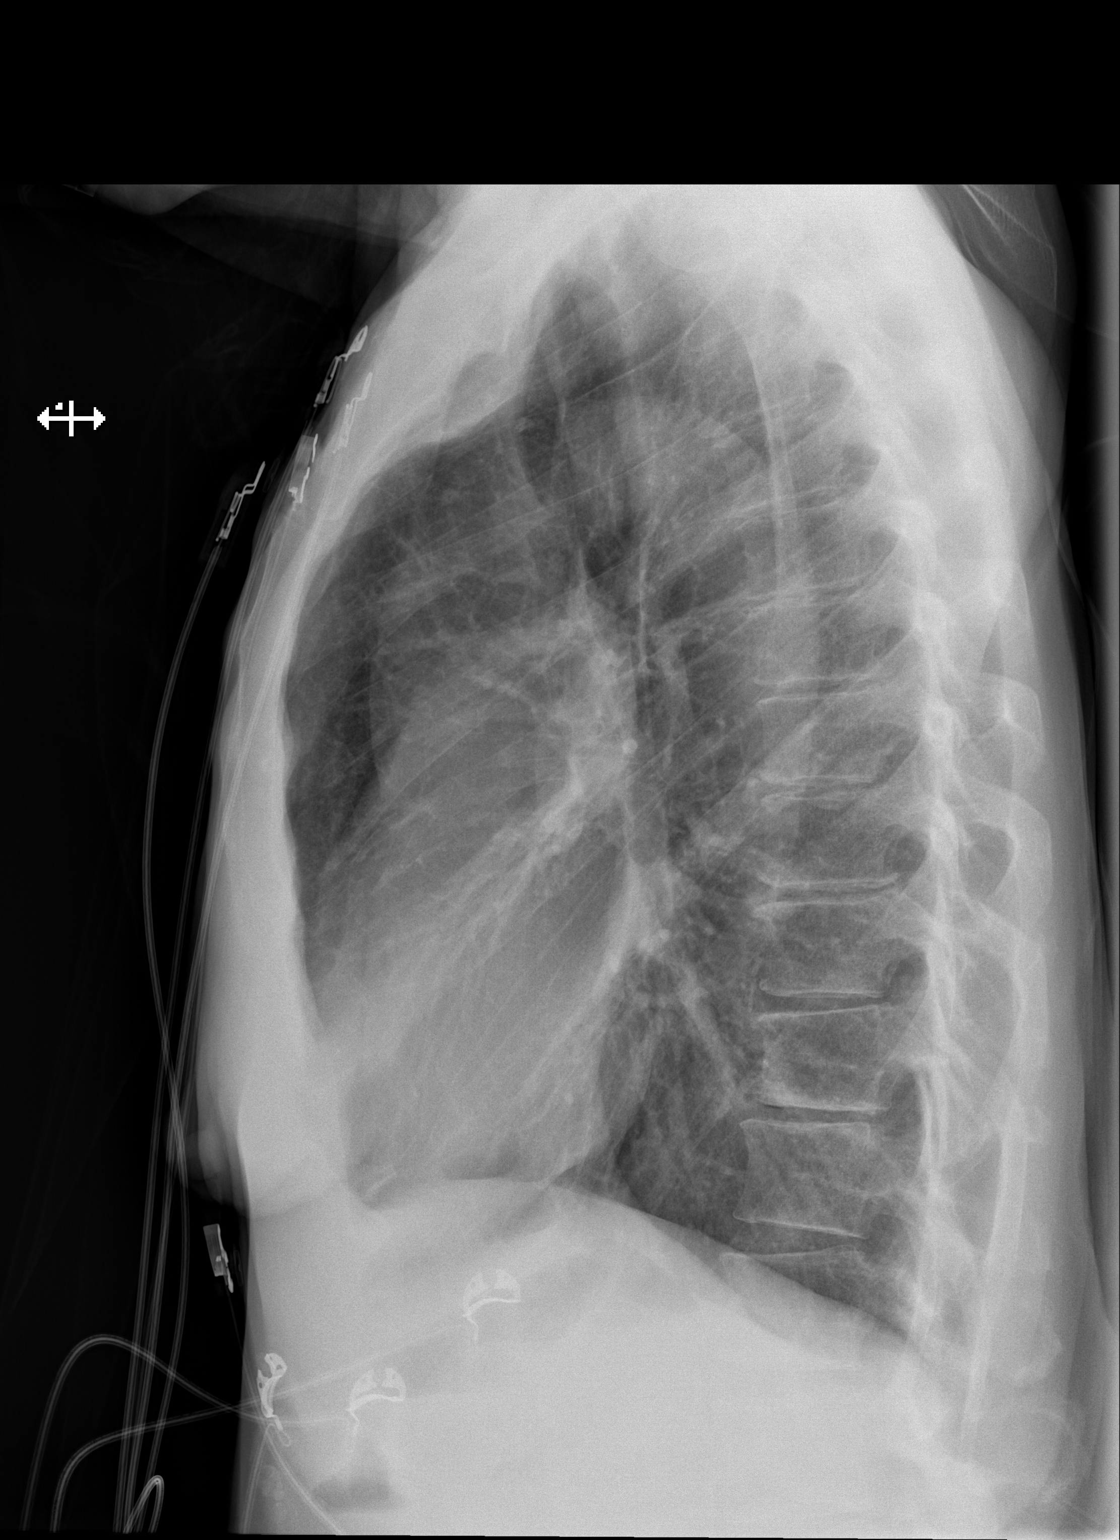

[2 of 2 positions shown; findings below may reference images not displayed]

FINDINGS: The heart and mediastinal contours are within normal limits.

No focal consolidation. No pulmonary edema. No pleural effusion. No
pneumothorax.

No acute osseous abnormality.
IMPRESSION: No active cardiopulmonary disease.

## 2022-02-05 ENCOUNTER — Encounter: Payer: Self-pay | Admitting: Physician Assistant

## 2022-02-05 ENCOUNTER — Emergency Department (HOSPITAL_COMMUNITY)
Admission: EM | Admit: 2022-02-05 | Discharge: 2022-02-06 | Disposition: A | Discharge status: Other Acute Inpatient Hospital | Payer: Medicaid Other | Attending: Emergency Medicine | Admitting: Emergency Medicine

## 2022-02-05 DIAGNOSIS — I10 Essential (primary) hypertension: Secondary | ICD-10-CM | POA: Diagnosis not present

## 2022-02-05 DIAGNOSIS — R1012 Left upper quadrant pain: Secondary | ICD-10-CM | POA: Insufficient documentation

## 2022-02-05 DIAGNOSIS — Z8616 Personal history of COVID-19: Secondary | ICD-10-CM | POA: Diagnosis not present

## 2022-02-05 DIAGNOSIS — Z79899 Other long term (current) drug therapy: Secondary | ICD-10-CM | POA: Insufficient documentation

## 2022-02-05 DIAGNOSIS — R109 Unspecified abdominal pain: Secondary | ICD-10-CM

## 2022-02-05 NOTE — ED Triage Notes (Signed)
Pt BIB [facility] from [facility] for chest pains x 2 days, abd pain and vomiting starting 1-2 hours ago

## 2022-02-06 ENCOUNTER — Other Ambulatory Visit: Payer: Self-pay

## 2022-02-06 ENCOUNTER — Emergency Department (HOSPITAL_COMMUNITY): Payer: Medicaid Other

## 2022-02-06 LAB — I-STAT BETA HCG BLOOD, ED (MC, WL, AP ONLY): I-stat hCG, quantitative: <5 m[IU]/mL (ref <5)

## 2022-02-06 LAB — COMPREHENSIVE METABOLIC PANEL
ALT: 13 U/L (ref 0–44)
AST: 22 U/L (ref 15–41)
Albumin: 3.3 g/dL — ABNORMAL LOW (ref 3.5–5.0)
Alkaline Phosphatase: 126 U/L (ref 38–126)
Anion gap: 11 (ref 5–15)
BUN: 14 mg/dL (ref 6–20)
CO2: 22 mmol/L (ref 22–32)
Calcium: 9.9 mg/dL (ref 8.9–10.3)
Chloride: 107 mmol/L (ref 98–111)
Creatinine, Ser: 0.67 mg/dL (ref 0.44–1.00)
GFR, Estimated: >60 mL/min (ref >60)
Glucose, Bld: 86 mg/dL (ref 70–99)
Potassium: 4.8 mmol/L (ref 3.5–5.1)
Sodium: 140 mmol/L (ref 135–145)
Total Bilirubin: 0.9 mg/dL (ref 0.3–1.2)
Total Protein: 6.8 g/dL (ref 6.5–8.1)

## 2022-02-06 LAB — CBC WITH DIFFERENTIAL/PLATELET
Abs Immature Granulocytes: 0.03 10*3/uL (ref 0.00–0.07)
Basophils Absolute: 0 10*3/uL (ref 0.0–0.1)
Basophils Relative: 0 %
Eosinophils Absolute: 0.1 10*3/uL (ref 0.0–0.5)
Eosinophils Relative: 1 %
HCT: 36.7 % (ref 36.0–46.0)
Hemoglobin: 12.5 g/dL (ref 12.0–15.0)
Immature Granulocytes: 0 %
Lymphocytes Relative: 23 %
Lymphs Abs: 2.1 10*3/uL (ref 0.7–4.0)
MCH: 27.6 pg (ref 26.0–34.0)
MCHC: 34.1 g/dL (ref 30.0–36.0)
MCV: 81 fL (ref 80.0–100.0)
Monocytes Absolute: 0.6 10*3/uL (ref 0.1–1.0)
Monocytes Relative: 7 %
Neutro Abs: 6.2 10*3/uL (ref 1.7–7.7)
Neutrophils Relative %: 69 %
Platelets: 255 10*3/uL (ref 150–400)
RBC: 4.53 MIL/uL (ref 3.87–5.11)
RDW: 15.6 % — ABNORMAL HIGH (ref 11.5–15.5)
WBC: 9 10*3/uL (ref 4.0–10.5)
nRBC: 0 % (ref 0.0–0.2)

## 2022-02-06 LAB — I-STAT CHEM 8, ED
BUN: 17 mg/dL (ref 6–20)
Calcium, Ion: 1.06 mmol/L — ABNORMAL LOW (ref 1.15–1.40)
Chloride: 108 mmol/L (ref 98–111)
Creatinine, Ser: 0.5 mg/dL (ref 0.44–1.00)
Glucose, Bld: 83 mg/dL (ref 70–99)
HCT: 37 % (ref 36.0–46.0)
Hemoglobin: 12.6 g/dL (ref 12.0–15.0)
Potassium: 4.6 mmol/L (ref 3.5–5.1)
Sodium: 137 mmol/L (ref 135–145)
TCO2: 24 mmol/L (ref 22–32)

## 2022-02-06 LAB — URINALYSIS, ROUTINE W REFLEX MICROSCOPIC
Bilirubin Urine: NEGATIVE
Glucose, UA: NEGATIVE mg/dL
Hgb urine dipstick: NEGATIVE
Ketones, ur: NEGATIVE mg/dL
Leukocytes,Ua: NEGATIVE
Nitrite: NEGATIVE
Protein, ur: NEGATIVE mg/dL
Specific Gravity, Urine: 1.005 (ref 1.005–1.030)
pH: 7 (ref 5.0–8.0)

## 2022-02-06 LAB — LIPASE, BLOOD: Lipase: 36 U/L (ref 11–51)

## 2022-02-06 LAB — TROPONIN I (HIGH SENSITIVITY): Troponin I (High Sensitivity): 7 ng/L (ref <18)

## 2022-02-06 MED ORDER — SODIUM CHLORIDE 0.9 % IV BOLUS: 1000.0000 mL | Freq: Once | INTRAVENOUS | Status: DC

## 2022-02-06 MED ORDER — PROCHLORPERAZINE EDISYLATE 10 MG/2ML IJ SOLN
10.0000 mg | Freq: Once | INTRAMUSCULAR | Status: DC
  Filled 2022-02-06: qty 2

## 2022-02-06 MED ORDER — SODIUM CHLORIDE 0.9 % IV SOLN: INTRAVENOUS | Status: DC

## 2022-02-06 MED ORDER — ALUM & MAG HYDROXIDE-SIMETH 200-200-20 MG/5ML PO SUSP
30.0000 mL | Freq: Once | ORAL | Status: DC
  Filled 2022-02-06: qty 30

## 2022-02-06 MED ORDER — ONDANSETRON 4 MG PO TBDP: 4.0000 mg | ORAL_TABLET | Freq: Three times a day (TID) | ORAL | 0 refills | Status: AC | PRN

## 2022-02-06 NOTE — ED Notes (Signed)
Attempted x 3 to give report to [facility]. No answer

## 2022-02-06 NOTE — ED Notes (Addendum)
Pt refused IV, lab work and medications stating "I told him it doesn't work for me and I don't want it".  Attending provider notified of the same.

## 2022-02-06 NOTE — ED Notes (Signed)
Report called to Precious LPN at [facility]

## 2022-02-06 NOTE — ED Notes (Signed)
Called [facility] to transport patient back to [facility]

## 2022-02-06 NOTE — ED Notes (Signed)
Pt refusing VS and VS monitoring at this time

## 2022-02-06 NOTE — ED Notes (Signed)
[facility] arrived for transport back to [place]. Pt allowed VS prior to D/C. Pt taken out of ER in stable condition on stretcher with all belongings

## 2022-02-06 NOTE — Care Plan (Signed)
Pt refused CT, was very aggressive, using profanity at staff to leave her alone.

## 2022-02-06 NOTE — ED Provider Notes (Signed)
[facility] EMERGENCY DEPARTMENT Provider Note  CSN: [id number] Arrival date & time: 02/05/22 2231  Chief Complaint(s) Chest Pain  HPI Terri [name] is a 46 y.o. female with a past medical history listed below including polysubstance use disorder who is currently in rehab presents to the emergency department for several months of abdominal pain, worse over the past 2 days.  Mostly left upper quadrant and epigastric radiating up to the chest.  No associated shortness of breath.  She reports several bouts of nonbloody nonbilious emesis today.  No suspicious food intake.  She denied any alcohol or illicit drug use while at the rehab facility.  Still having bowel movements and passing gas.  The history is provided by the patient.    Past Medical History Past Medical History:  Diagnosis Date   Arthritis    Back and legs   Chronic back pain    DVT (deep venous thrombosis) (HCC)    early 20's leg   GIB (gastrointestinal bleeding) 09/13/2017   History of blood transfusion    Hypertension    Migraine headache    Mood disorder ([place])    previously documented as bipolar and MDD   Neuropathy    Pneumonia    Positive PPD    x 2 last time 09/2017   Sciatica    Stress incontinence    Patient Active Problem List   Diagnosis Date Noted   Pressure injury of skin 10/08/2021   Drug-seeking behavior 10/06/2021   Goals of care, counseling/discussion    Protein-calorie malnutrition, severe 09/21/2021   Involuntary commitment 09/19/2021   Acute respiratory failure with hypoxia ([place]) 09/15/2021   Dysuria 09/15/2021   Vaginal discharge 09/14/2021   Exudative pleural effusion 09/12/2021   Hypophosphatemia 09/12/2021   Ascites 09/11/2021   Hepatic abscess    Acute back pain    Swelling of left hand    Disseminated MSSA with liver abscess, left foot osteo and abscess, 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    Candida infection, esophageal ([place]) 09/03/2021   Portal vein thrombosis 08/31/2021    Community acquired pneumonia 07/08/2021   Homelessness 07/08/2021   Multilobar pneumonia with acute hypoxic respiratory failure ([place])    Cellulitis of right lower extremity 05/28/2021   Vitamin B12 deficiency 05/24/2021   Cellulitis 05/22/2021   COVID-19 virus infection 05/22/2021   Overdose 04/13/2021   Hypoxemia 04/13/2021   Hypoglycemia 04/13/2021   Asthma exacerbation 04/01/2021   Acute on chronic blood loss anemia 04/01/2021   Iron deficiency anemia 03/18/2021   Hyponatremia 03/18/2021   Marginal ulcer 03/15/2021   Microcytic anemia 02/26/2021   Mild protein malnutrition ([place]) 02/26/2021   Polysubstance abuse ([place]) 01/16/2021   Abdominal pain 01/14/2021   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   Persistent fever 02/09/2019   COVID-19 virus not detected    Chest pain    Bacteremia due to Escherichia coli    Esophagitis    Hypokalemia    Hypomagnesemia    Sepsis ([place]) 02/02/2019   Leucocytosis 02/02/2019   Thrombocytosis 02/02/2019  Acute lower UTI 02/02/2019   Apnea 09/13/2017   Essential hypertension    Cellulitis and abscess of neck 08/12/2017   Cellulitis of submandibular region 08/10/2017   Odontogenic infection of jaw 08/10/2017   Cholecystitis 02/14/2017   Opioid abuse with opioid-induced mood disorder ([place]) 01/25/2017   Anxiety 02/11/2014   Depression 07/12/2012   Home Medication(s) Prior to Admission medications   Medication Sig Start Date End Date Taking? Authorizing Provider  ondansetron  (ZOFRAN-ODT) 4 MG disintegrating tablet Take 1 tablet (4 mg total) by mouth every 8 (eight) hours as needed for up to 3 days for nausea or vomiting. 02/06/22 02/09/22 Yes Delia Sitar, Grayce Sessions, MD  calcium carbonate (TUMS - DOSED IN MG ELEMENTAL CALCIUM) 500 MG chewable tablet Chew 1 tablet (200 mg of elemental calcium total) by mouth 3 (three) times daily. 10/12/21   Amin, Jeanella Flattery, MD  cloNIDine (CATAPRES - DOSED IN MG/24 HR) 0.1 mg/24hr patch Place 1 patch (0.1 mg total) onto the skin once a week. 10/16/21   Amin, Jeanella Flattery, MD  cyclobenzaprine (FLEXERIL) 5 MG tablet Take 1 tablet (5 mg total) by mouth 3 (three) times daily as needed for muscle spasms. 10/12/21   Amin, Jeanella Flattery, MD  dronabinol (MARINOL) 5 MG capsule Take 1 capsule (5 mg total) by mouth 2 (two) times daily before lunch and supper. 10/12/21   Amin, Jeanella Flattery, MD  folic acid (FOLVITE) 1 MG tablet Take 1 tablet (1 mg total) by mouth daily. 10/13/21   Amin, Jeanella Flattery, MD  HYDROcodone-acetaminophen (NORCO) 7.5-325 MG tablet Take 1 tablet by mouth every 6 (six) hours as needed for moderate pain or severe pain. 10/12/21   Amin, Jeanella Flattery, MD  lisinopril (ZESTRIL) 10 MG tablet Take 1 tablet (10 mg total) by mouth at bedtime. 10/12/21   Amin, Jeanella Flattery, MD  pantoprazole (PROTONIX) 40 MG tablet Take 1 tablet (40 mg total) by mouth daily before breakfast. 10/12/21   Amin, Jeanella Flattery, MD  thiamine 100 MG tablet Take 1 tablet (100 mg total) by mouth daily. 10/13/21   Damita Lack, MD                                                                                                                                    Allergies Nsaids and Ketorolac tromethamine  Review of Systems Review of Systems As noted in HPI  Physical Exam Vital Signs  I have reviewed the triage vital signs BP (!) 142/106   Pulse 87   Temp 98.5 F (36.9 C) (Oral)   Resp (!) 22   LMP  (LMP Unknown)   SpO2 97%   Physical Exam Vitals reviewed.   Constitutional:      General: She is not in acute distress.    Appearance: She is well-developed. She is not diaphoretic.  HENT:     Head: Normocephalic and atraumatic.     Nose: Nose normal.  Eyes:     General: No scleral icterus.       Right eye: No discharge.        Left eye: No discharge.     Conjunctiva/sclera: Conjunctivae normal.     Pupils: Pupils are equal, round, and reactive to light.  Cardiovascular:     Rate and Rhythm: Normal rate and regular rhythm.     Heart sounds: No murmur heard.    No friction rub. No gallop.  Pulmonary:     Effort: Pulmonary effort is normal. No respiratory distress.     Breath sounds: Normal breath sounds. No stridor. No rales.  Abdominal:     General: There is no distension.     Palpations: Abdomen is soft.     Tenderness: There is abdominal tenderness in the left upper quadrant. There is no guarding or rebound.  Musculoskeletal:        General: No tenderness.     Cervical back: Normal range of motion and neck supple.  Skin:    General: Skin is warm and dry.     Findings: No erythema or rash.  Neurological:     Mental Status: She is alert and oriented to person, place, and time.     ED Results and Treatments Labs (all labs ordered are listed, but only abnormal results are displayed) Labs Reviewed  COMPREHENSIVE METABOLIC PANEL - Abnormal; Notable for the following components:      Result Value   Albumin 3.3 (*)    All other components within normal limits  CBC WITH DIFFERENTIAL/PLATELET - Abnormal; Notable for the following components:   RDW 15.6 (*)    All other components within normal limits  URINALYSIS, ROUTINE W REFLEX MICROSCOPIC - Abnormal; Notable for the following components:   Color, Urine STRAW (*)    All other components within normal limits  I-STAT CHEM 8, ED - Abnormal; Notable for the following components:   Calcium, Ion 1.06 (*)    All other components within normal limits  LIPASE, BLOOD  I-STAT BETA HCG  BLOOD, ED ([facility], [facility], [facility] ONLY)  TROPONIN I (HIGH SENSITIVITY)                                                                                                                         EKG  EKG Interpretation  Date/Time:    Ventricular Rate:    PR Interval:    QRS Duration:   QT Interval:    QTC Calculation:   R Axis:     Text Interpretation:         Radiology DG Chest 2 View  Result Date: 02/06/2022 CLINICAL DATA:  upper abd/chest pain EXAM: CHEST - 2 VIEW COMPARISON:  Chest x-ray 12/30/2021, CT chest 12/17/2021 FINDINGS: The heart and mediastinal contours are within normal limits. Question interstitial opacities the right lower lung zone that may correlate with findings noted on CT chest 12/17/2021. Chronic coarsened interstitial markings with no overt pulmonary edema. No pleural effusion. No  pneumothorax. No acute osseous abnormality. IMPRESSION: 1. No definite new focal consolidation. Question interstitial opacities the right lower lung zone that may correlate with findings noted on CT chest 12/17/2021. 2.  Emphysema (ICD10-J43.9). Electronically Signed   By: Iven Finn M.D.   On: 02/06/2022 01:17    Medications Ordered in ED Medications  sodium chloride 0.9 % bolus 1,000 mL (1,000 mLs Intravenous Not Given 02/06/22 0139)    And  0.9 %  sodium chloride infusion ( Intravenous Patient Refused/Not Given 02/06/22 0140)  prochlorperazine (COMPAZINE) injection 10 mg (10 mg Intravenous Patient Refused/Not Given 02/06/22 0139)  alum & mag hydroxide-simeth (MAALOX/MYLANTA) 200-200-20 MG/5ML suspension 30 mL (30 mLs Oral Patient Refused/Not Given 02/06/22 0140)                                                                                                                                     Procedures Procedures  (including critical care time)  Medical Decision Making / ED Course   Medical Decision Making Amount and/or Complexity of Data Reviewed Labs: ordered. Radiology:  ordered.  Risk OTC drugs. Prescription drug management.    Pain is mostly upper abdominal.  Last primary chest pain. We will obtain labs to assess for evidence of bili obstruction or pancreatitis.  Likely gastritis.  Less likely cardiac etiology but will rule out with EKG and single troponin given the duration.  Patient does have a history of reported gastric bypass though she is still having bowel movements and passing gas I have low suspicion for bowel obstruction.  Ordered Compazine and GI cocktail which patient declined.  She initially also declined getting any labs, but after discussion she agreed to get them.  She however did not allow an IV to be placed.  EKG without acute ischemic changes or evidence of pericarditis.  Troponin negative. CBC without leukocytosis or anemia. Metabolic panel without significant electrolyte derangement or renal sufficiency.  No evidence of bili obstruction or pancreatitis. UA without evidence of infection.  Chest x-ray without evidence of pneumonia, pneumothorax, pulmonary edema or pleural effusions.  Questionable right lower lobe opacity. Patient refused CT scan. Tolerated p.o.  Less concern for serious intra-abdominal [employer]/infectious process or bowel obstruction at this time.       Final Clinical Impression(s) / ED Diagnoses Final diagnoses:  Abdominal discomfort   The patient appears reasonably screened and/or stabilized for discharge and I doubt any other medical condition or other [facility] requiring further screening, evaluation, or treatment in the ED at this time. I have discussed the findings, Dx and Tx plan with the patient/family who expressed understanding and agree(s) with the plan. Discharge instructions discussed at length. The patient/family was given strict return precautions who verbalized understanding of the instructions. No further questions at time of discharge.  Disposition: Discharge  Condition: Good  ED Discharge  Orders          Ordered    ondansetron (ZOFRAN-ODT)  4 MG disintegrating tablet  Every 8 hours PRN        02/06/22 9692            Follow Up: Primary care provider  Call  to schedule an appointment for close follow up           This chart was dictated using voice recognition software.  Despite best efforts to proofread,  errors can occur which can change the documentation meaning.    Fatima Blank, MD 02/06/22 [phone]

## 2022-02-08 IMAGING — CR DG CHEST 2V
2 series · 2 of 2 positions shown · non-contrast
Comparison: 06/24/2021

CLINICAL DATA: Shortness of breath and productive cough for several
days.

EXAM:
CHEST - 2 VIEW

[w chest lat]
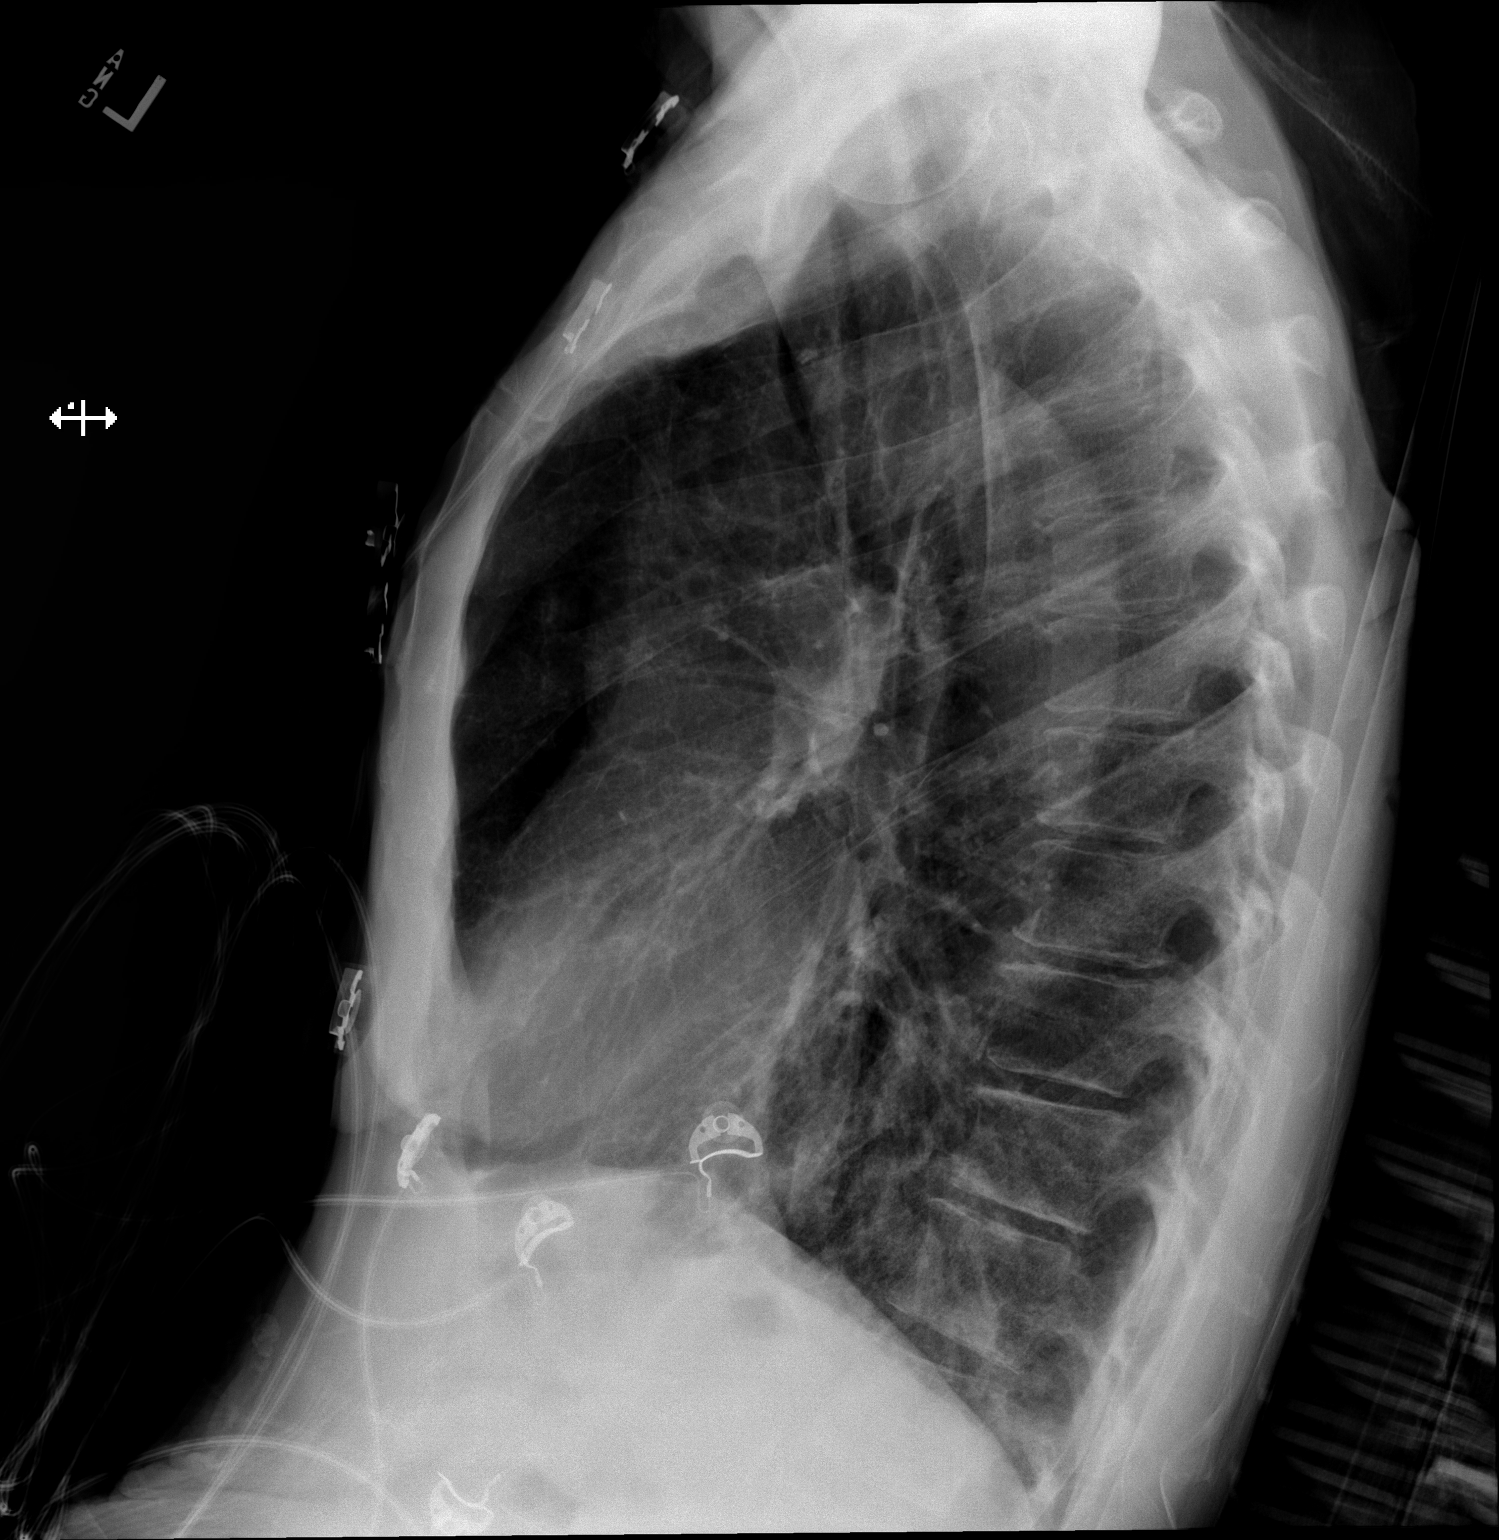

[x chest ap]
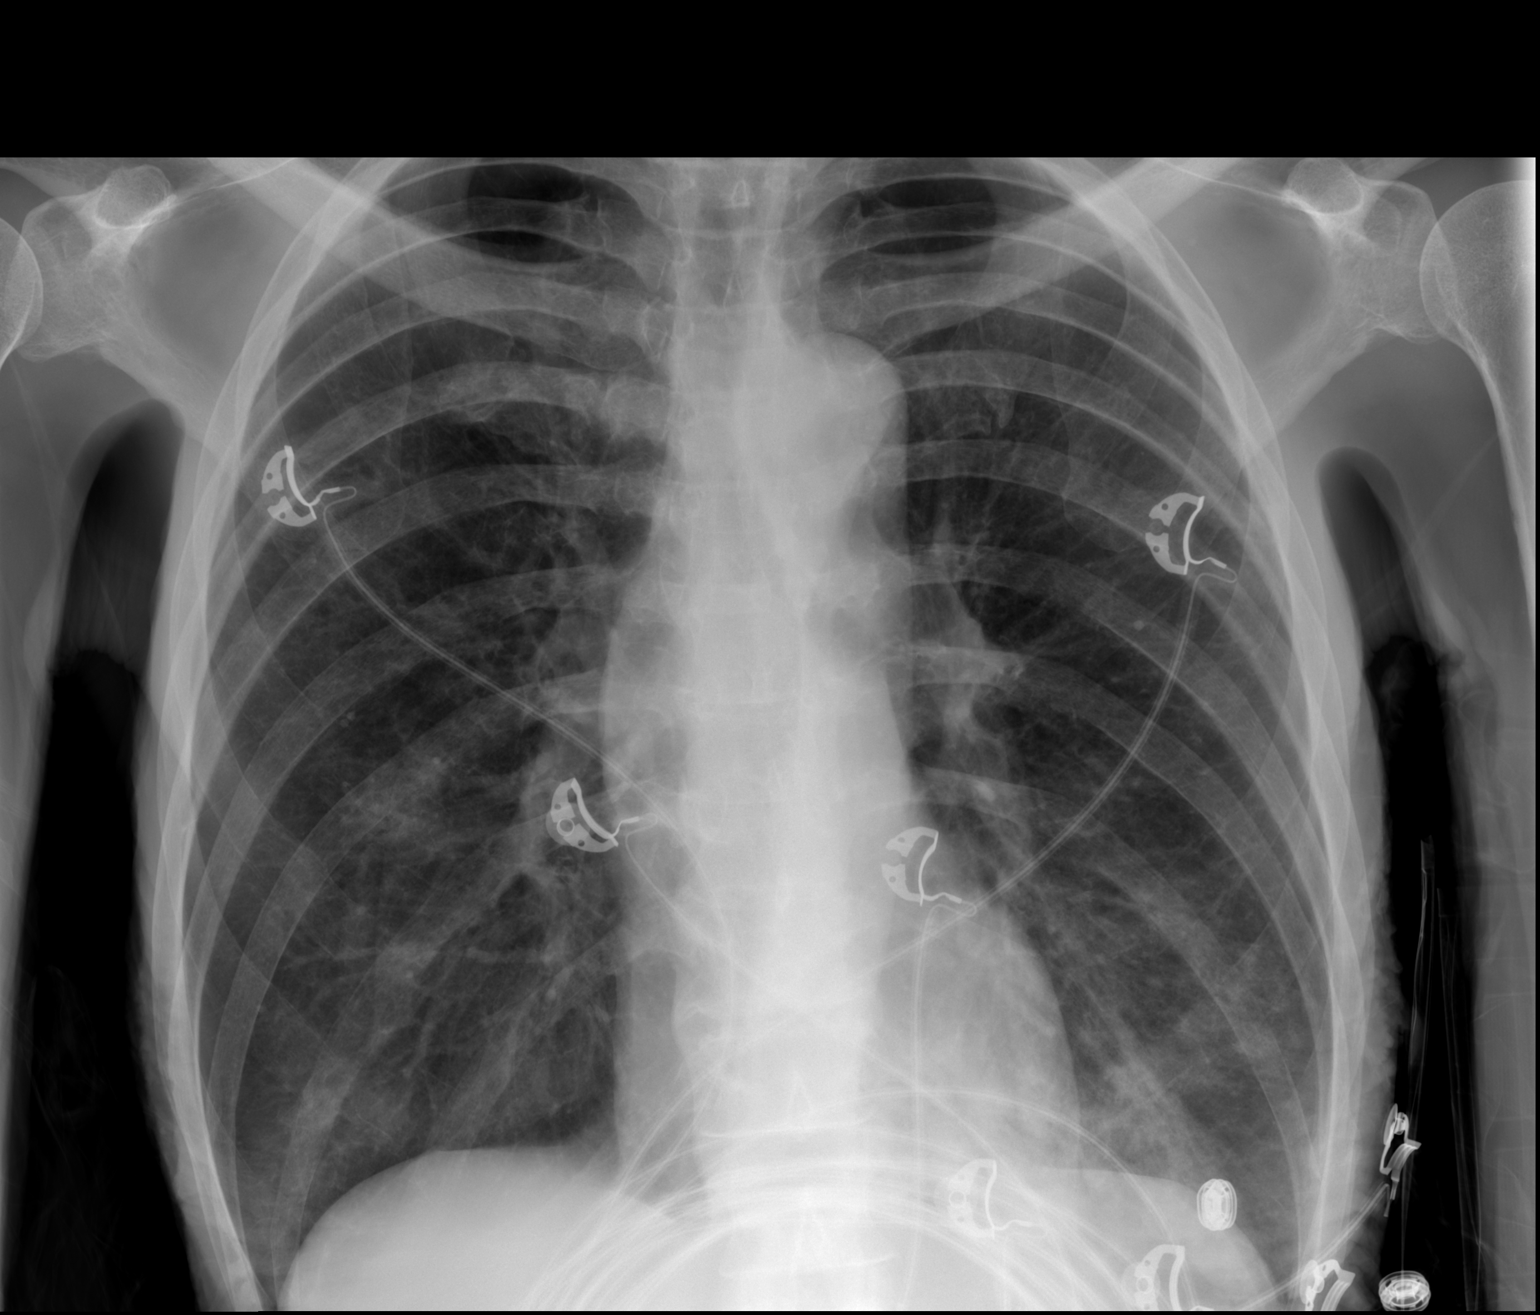

[2 of 2 positions shown; findings below may reference images not displayed]

FINDINGS: Normal heart size. No pleural effusion. Lungs are hyperinflated.
Interval development of patchy airspace densities within the left
lower lobe concerning for pneumonia. Visualized osseous structures
appear unremarkable.
IMPRESSION: Left lower lobe pneumonia.

## 2022-02-13 IMAGING — DX DG CHEST 2V
2 series · 2 of 2 positions shown · non-contrast
Comparison: Previous studies including the examination of
06/28/2021

CLINICAL DATA: Cough, pneumonia

EXAM:
CHEST - 2 VIEW

[chest pa]
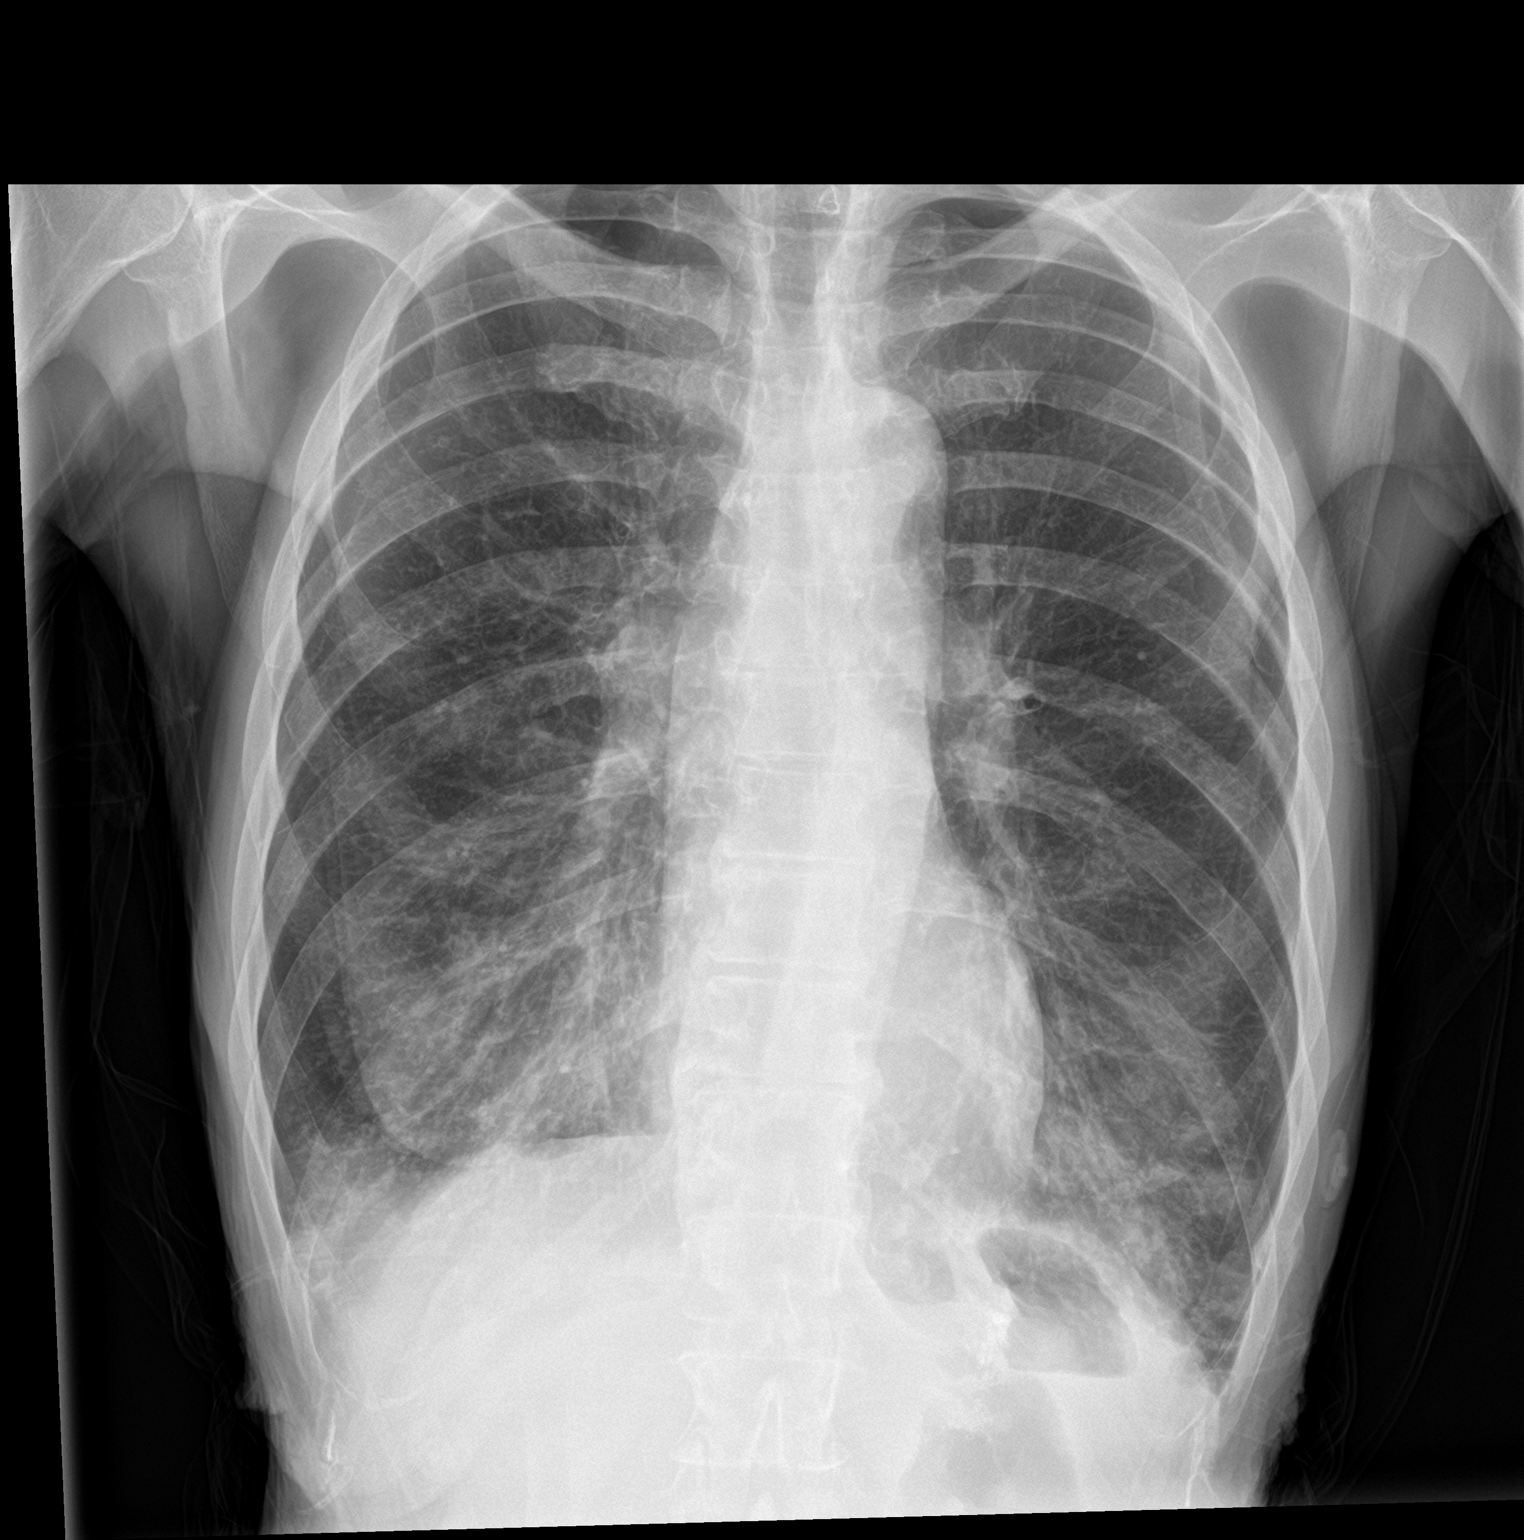

[chest lat]
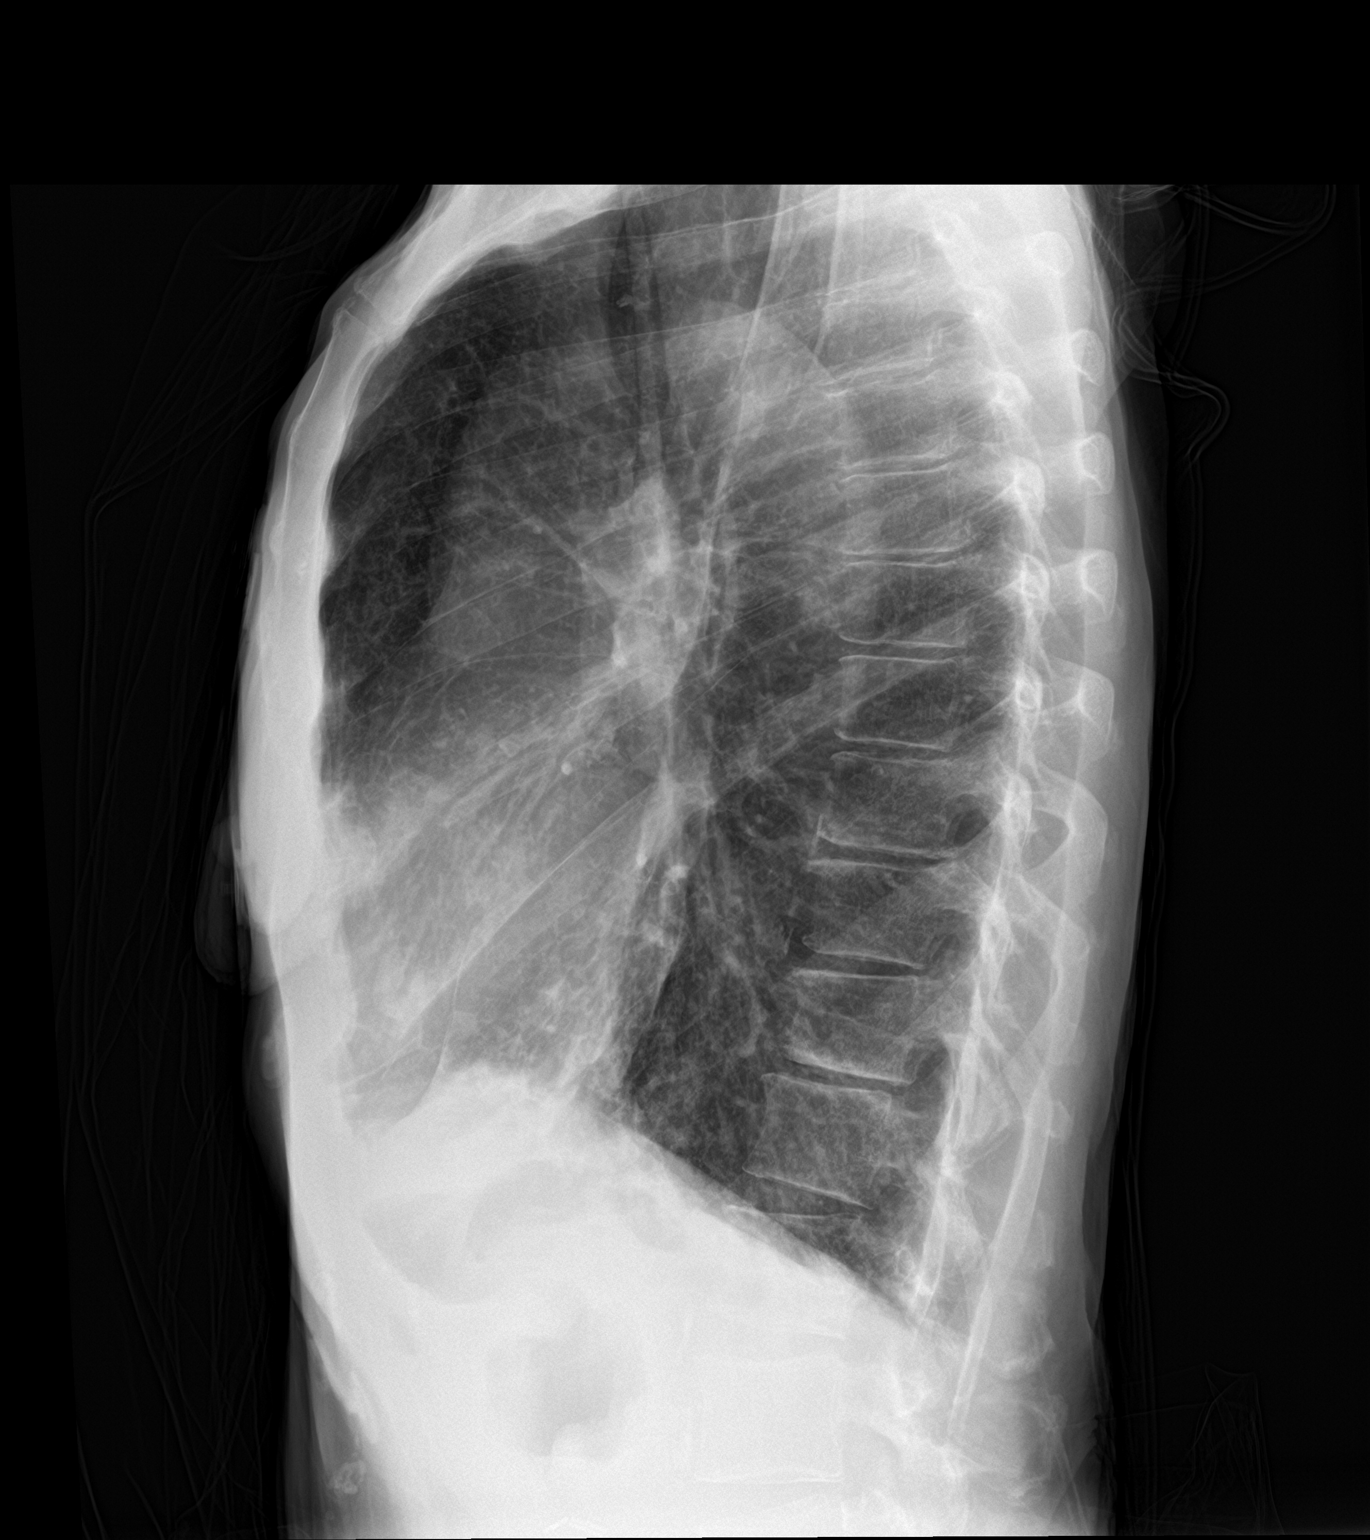

[2 of 2 positions shown; findings below may reference images not displayed]

FINDINGS: There is interval increase in patchy infiltrates in both lower lung
fields. In the lateral view, the infiltrates appear to be anterior
suggesting multifocal pneumonia in the right middle lobe and
lingula. There is minimal blunting of right lateral CP angle. There
is no pneumothorax. Low position of diaphragms suggests COPD.
IMPRESSION: There is interval increase in patchy infiltrates in both lower lung
fields suggesting multifocal pneumonia. Small right pleural
effusion. COPD.

## 2022-02-17 IMAGING — DX DG CHEST 2V
2 series · 2 of 2 positions shown · non-contrast
Comparison: Prior chest radiographs 07/03/2021 and earlier.
COMPARISON: Prior chest radiographs 07/03/2021 and earlier.

Addendum:
CLINICAL DATA: Provided history: Chest pain. Additional history
provided: Left-sided chest pain and shortness of breath for 2 weeks.
History of hypertension, pneumonia, DVT.

EXAM:
CHEST - 2 VIEW

[chest lat]
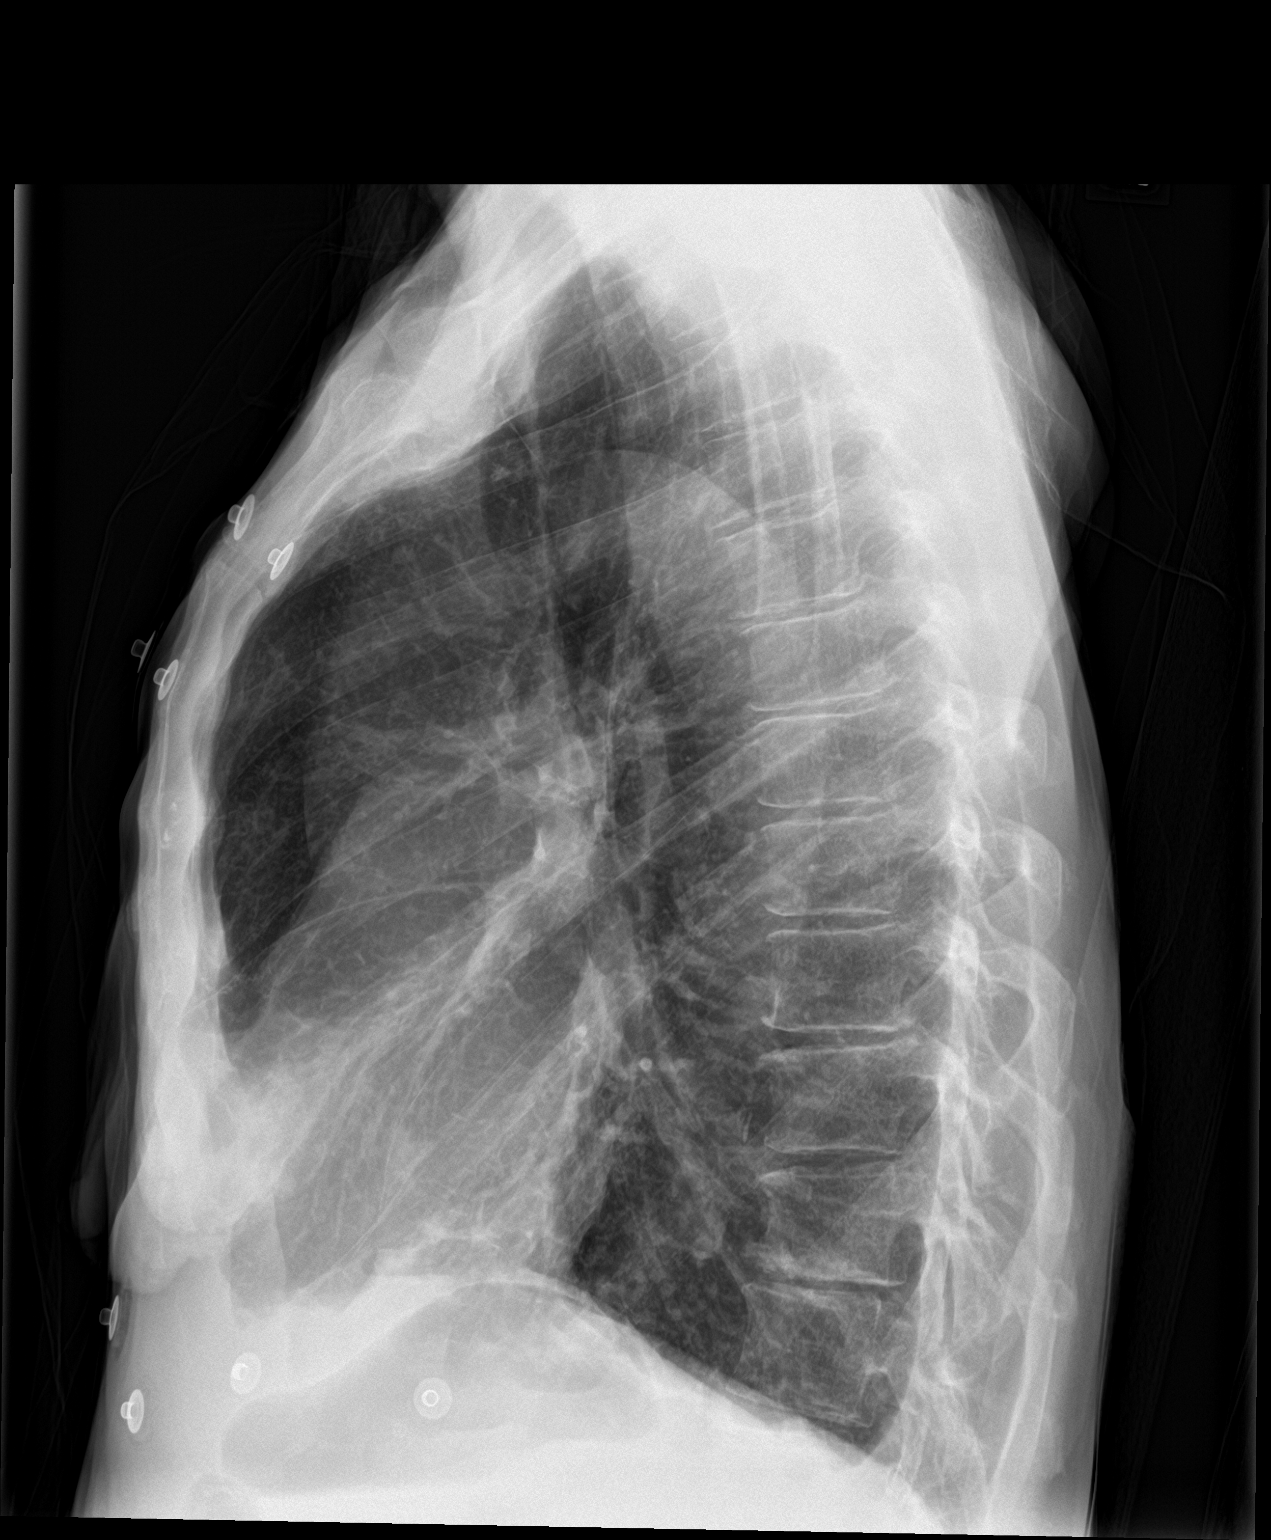

[chest ap]
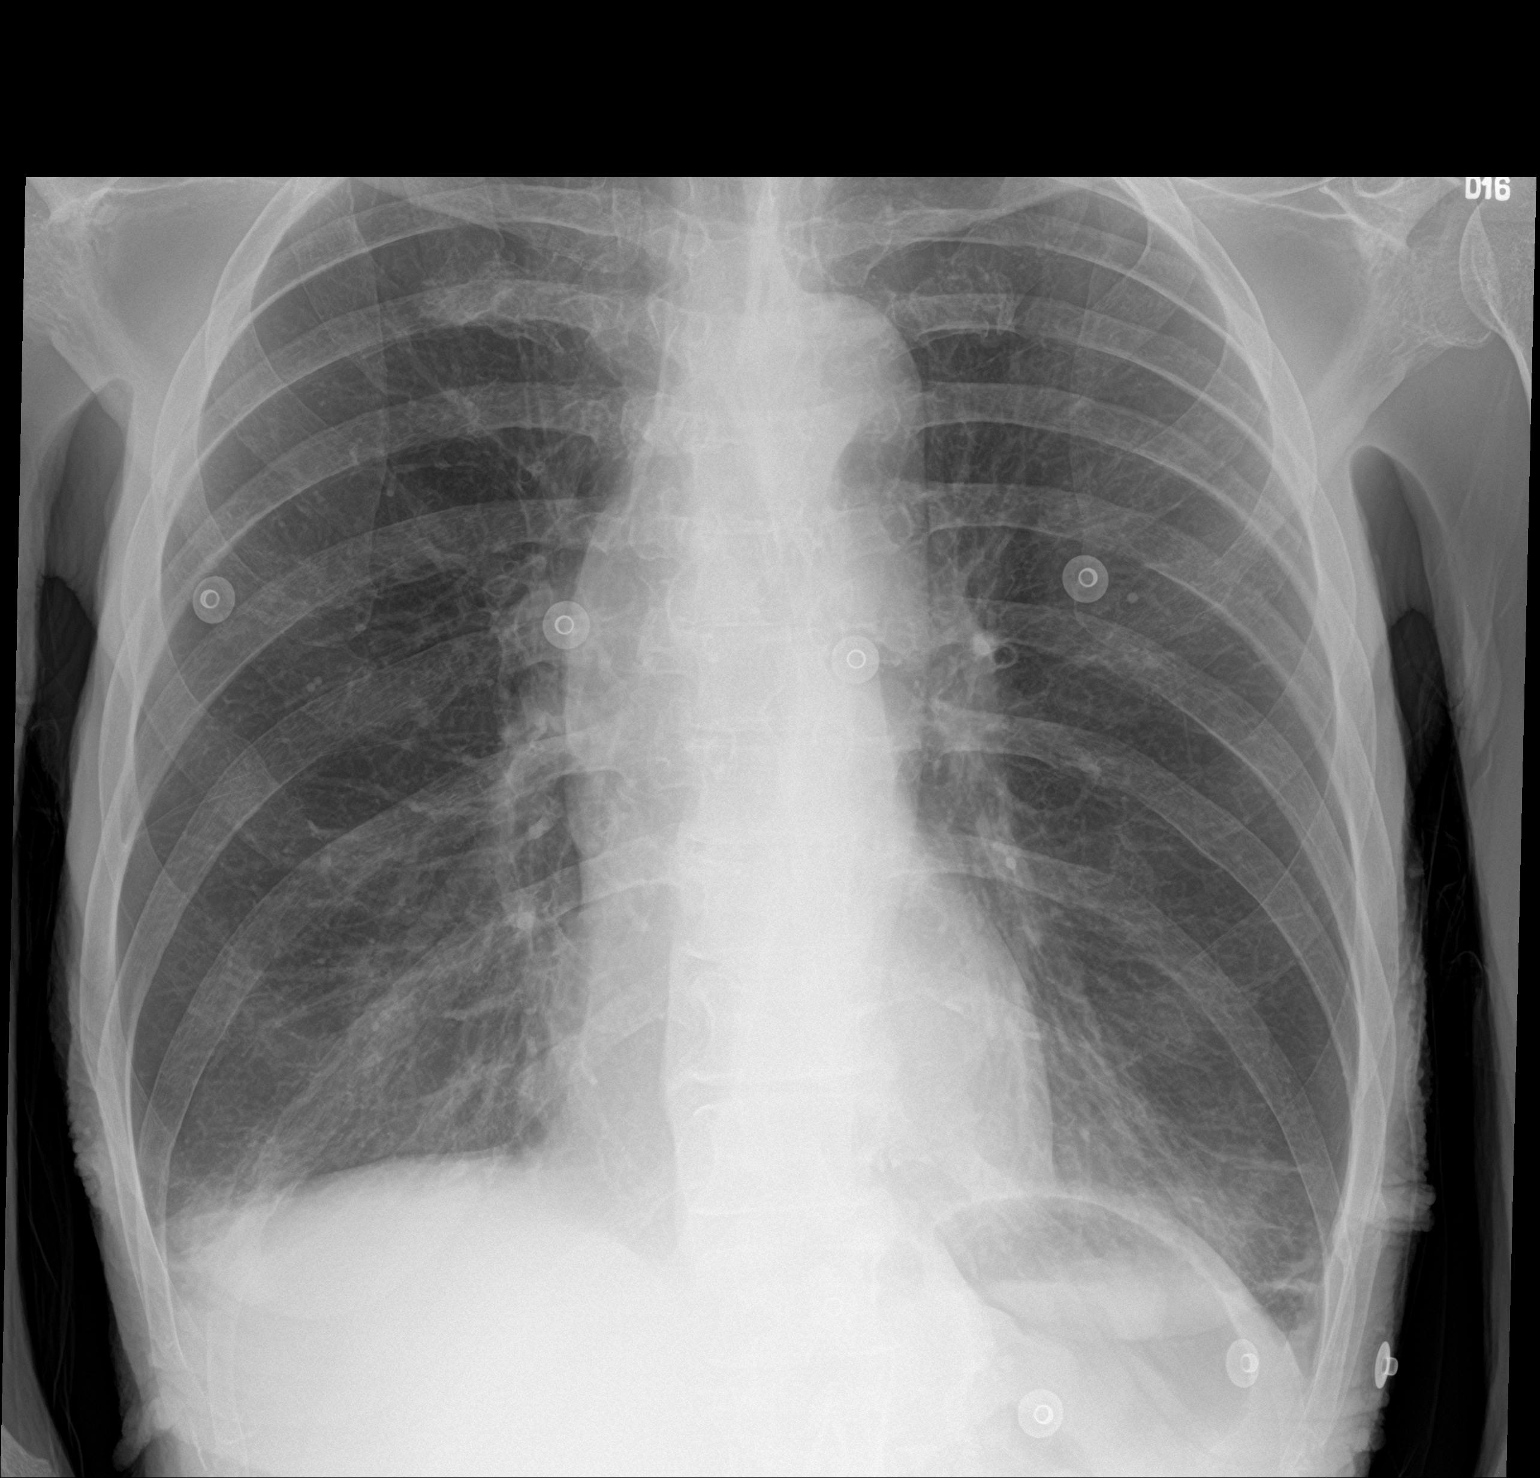

[2 of 2 positions shown; findings below may reference images not displayed]

FINDINGS: Portions of the posterior costophrenic angles are excluded from the
field of view, bilaterally. Heart size within normal limits.
Hyperinflated lungs. Persistent ill-defined opacities within the
bilateral lung bases. A trace right pleural effusion is questioned.
No evidence of pneumothorax. No acute bony abnormality identified.
Degenerative changes of the spine.
IMPRESSION: Persistent ill-defined opacities within the bilateral lung bases,
suspicious for multifocal pneumonia. Radiographic follow-up to
complete resolution recommended.

Hyperinflated lungs suggesting COPD.

Possible trace right pleural effusion.

ADDENDUM:
Please note, the first line under the FINDINGS section should read:
Portions of the posterior costophrenic angles are excluded from the
field of view on the lateral view radiograph, bilaterally.

*** End of Addendum ***
FINDINGS: Portions of the posterior costophrenic angles are excluded from the
field of view, bilaterally. Heart size within normal limits.
Hyperinflated lungs. Persistent ill-defined opacities within the
bilateral lung bases. A trace right pleural effusion is questioned.
No evidence of pneumothorax. No acute bony abnormality identified.
Degenerative changes of the spine.
IMPRESSION: Persistent ill-defined opacities within the bilateral lung bases,
suspicious for multifocal pneumonia. Radiographic follow-up to
complete resolution recommended.

Hyperinflated lungs suggesting COPD.

Possible trace right pleural effusion.

## 2022-03-08 ENCOUNTER — Ambulatory Visit: Payer: Medicaid Other | Admitting: Physician Assistant

## 2022-04-09 IMAGING — DX DG FOOT COMPLETE 3+V*R*
3 series · 3 of 3 positions shown · non-contrast
Comparison: 07/16/2019

CLINICAL DATA: 45-year-old female with left great toe pain.

EXAM:
RIGHT FOOT COMPLETE - 3+ VIEW

[foot ap]
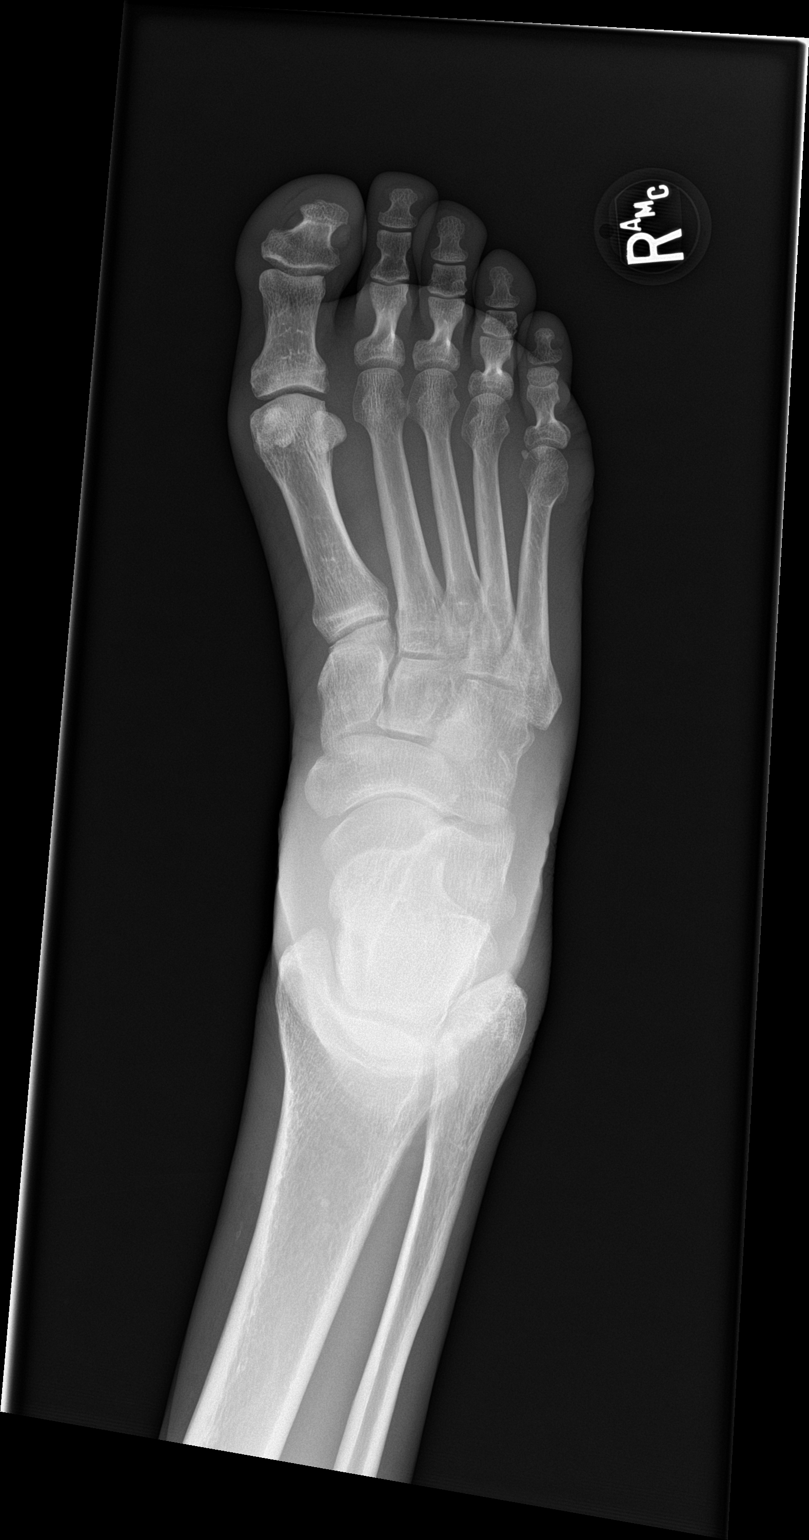

[foot obl]
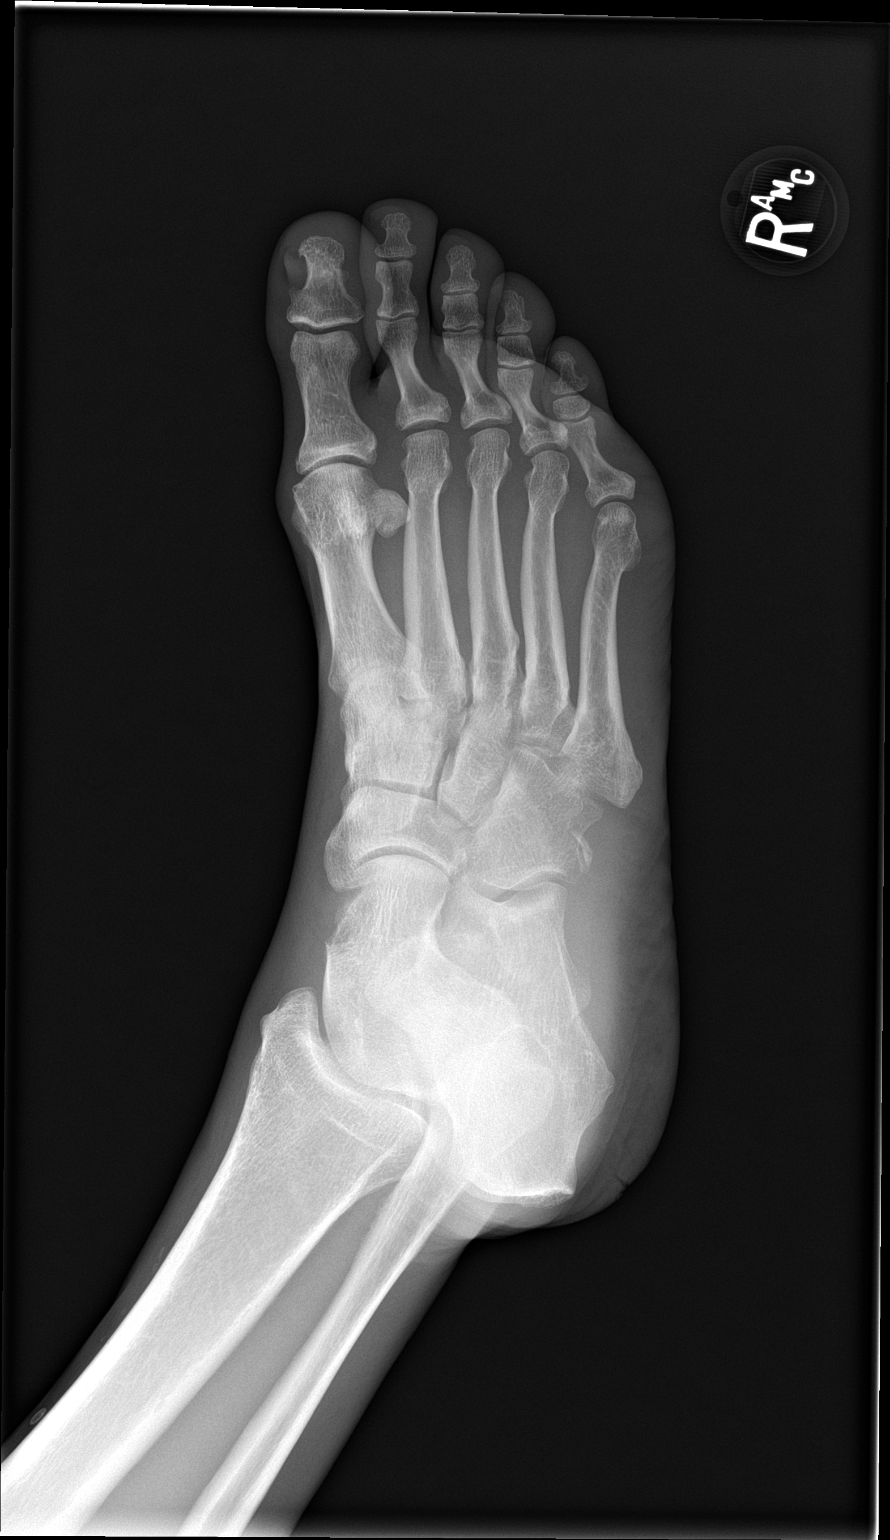

[foot lat]
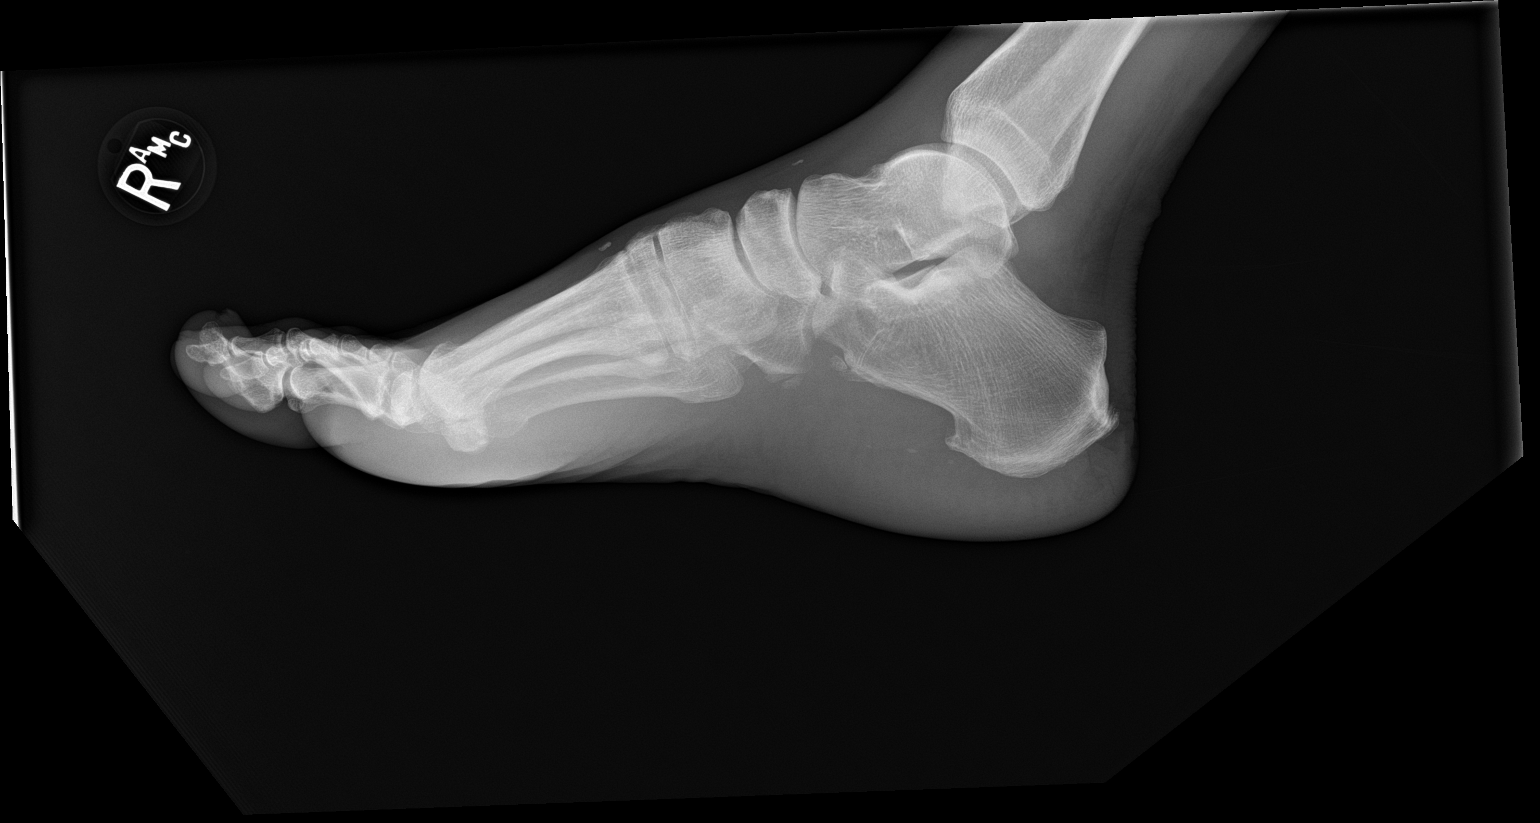

[3 of 3 positions shown; findings below may reference images not displayed]

FINDINGS: There is no evidence of fracture or dislocation. There is no
evidence of arthropathy or other focal bone abnormality. Mild hallux
valgus metatarsus Savitri varus. Soft tissues are unremarkable.
IMPRESSION: 1. No acute fracture or malalignment.
2. Mild hallux valgus metatarsus primus varus.

## 2022-04-09 IMAGING — DX DG FOOT COMPLETE 3+V*L*
3 series · 3 of 3 positions shown · non-contrast
Comparison: None.

CLINICAL DATA: Left great toe pain.

EXAM:
LEFT FOOT - COMPLETE 3+ VIEW

[foot ap]
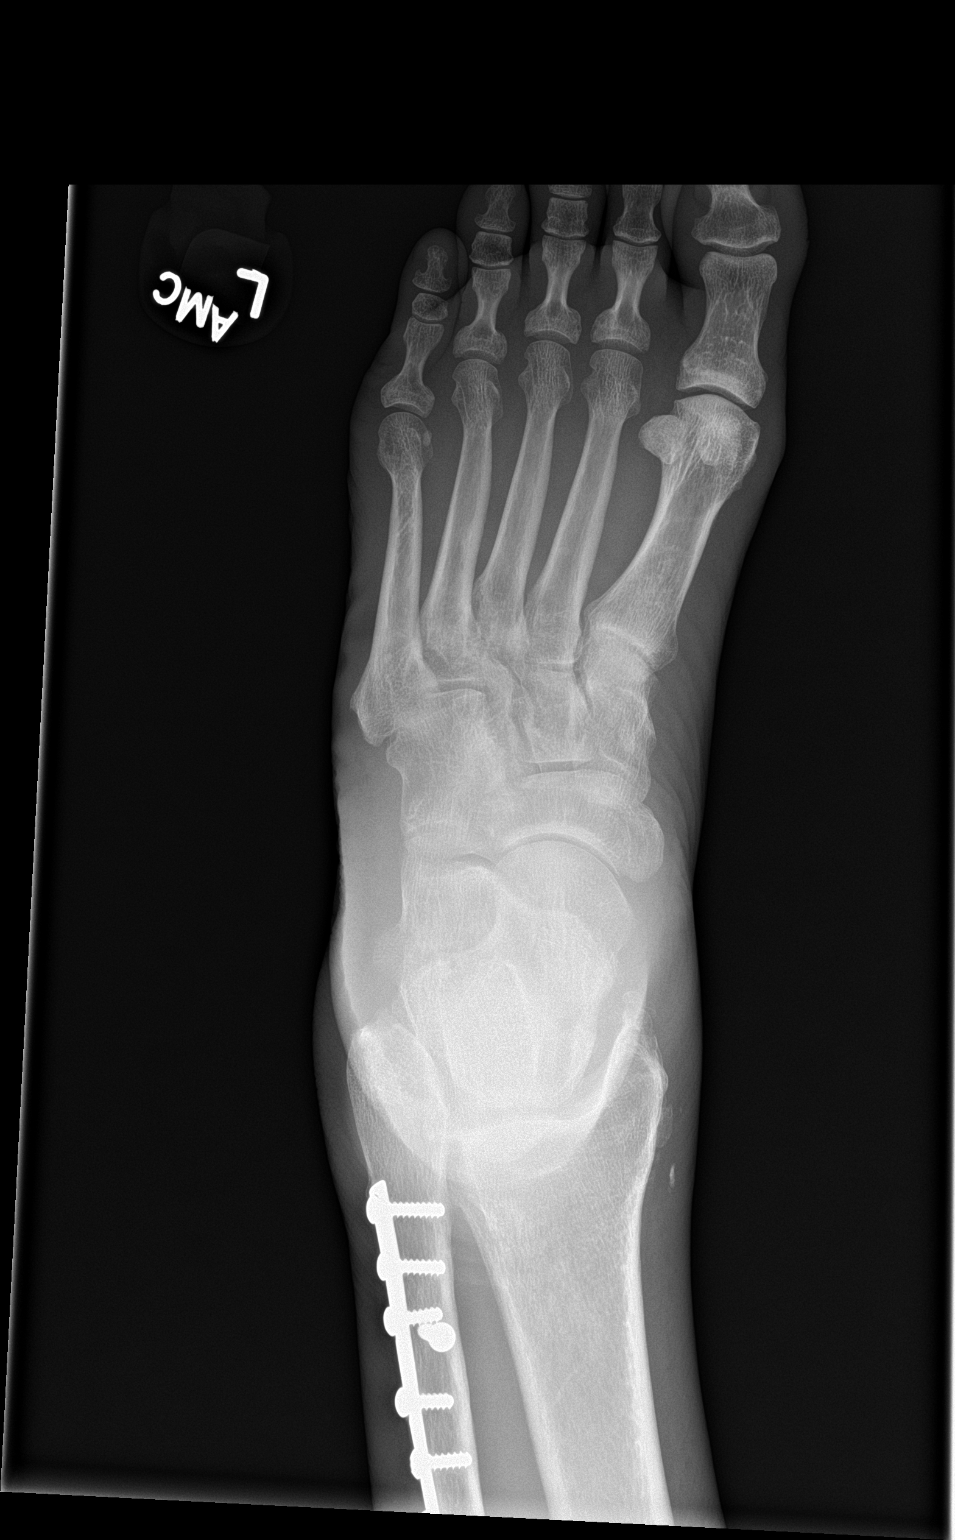

[foot obl]
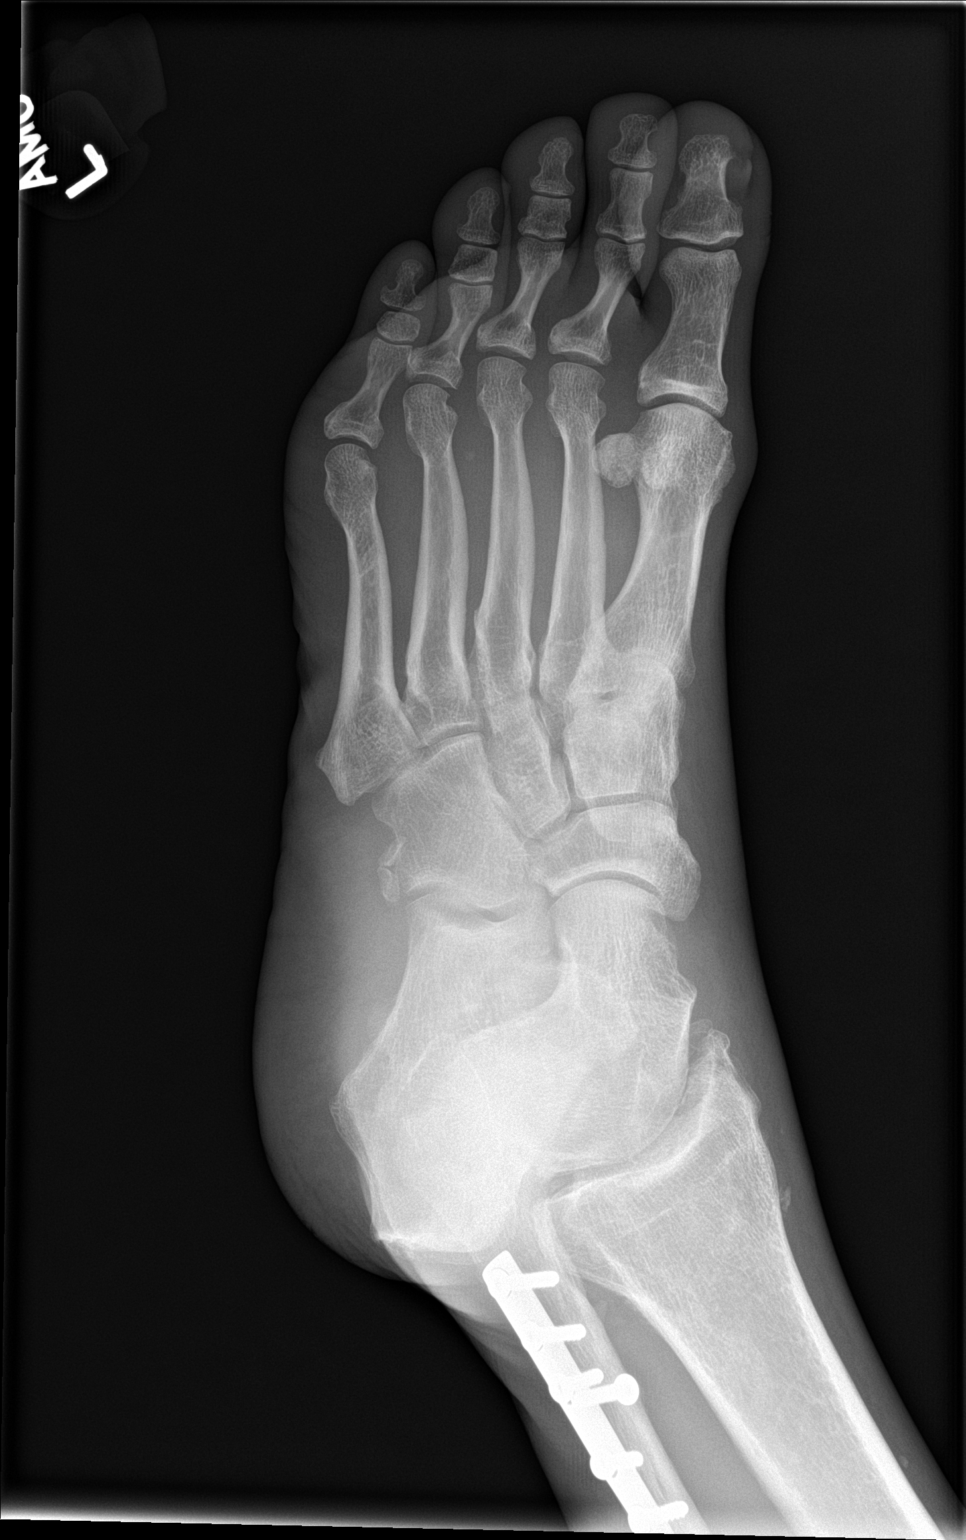

[foot lat]
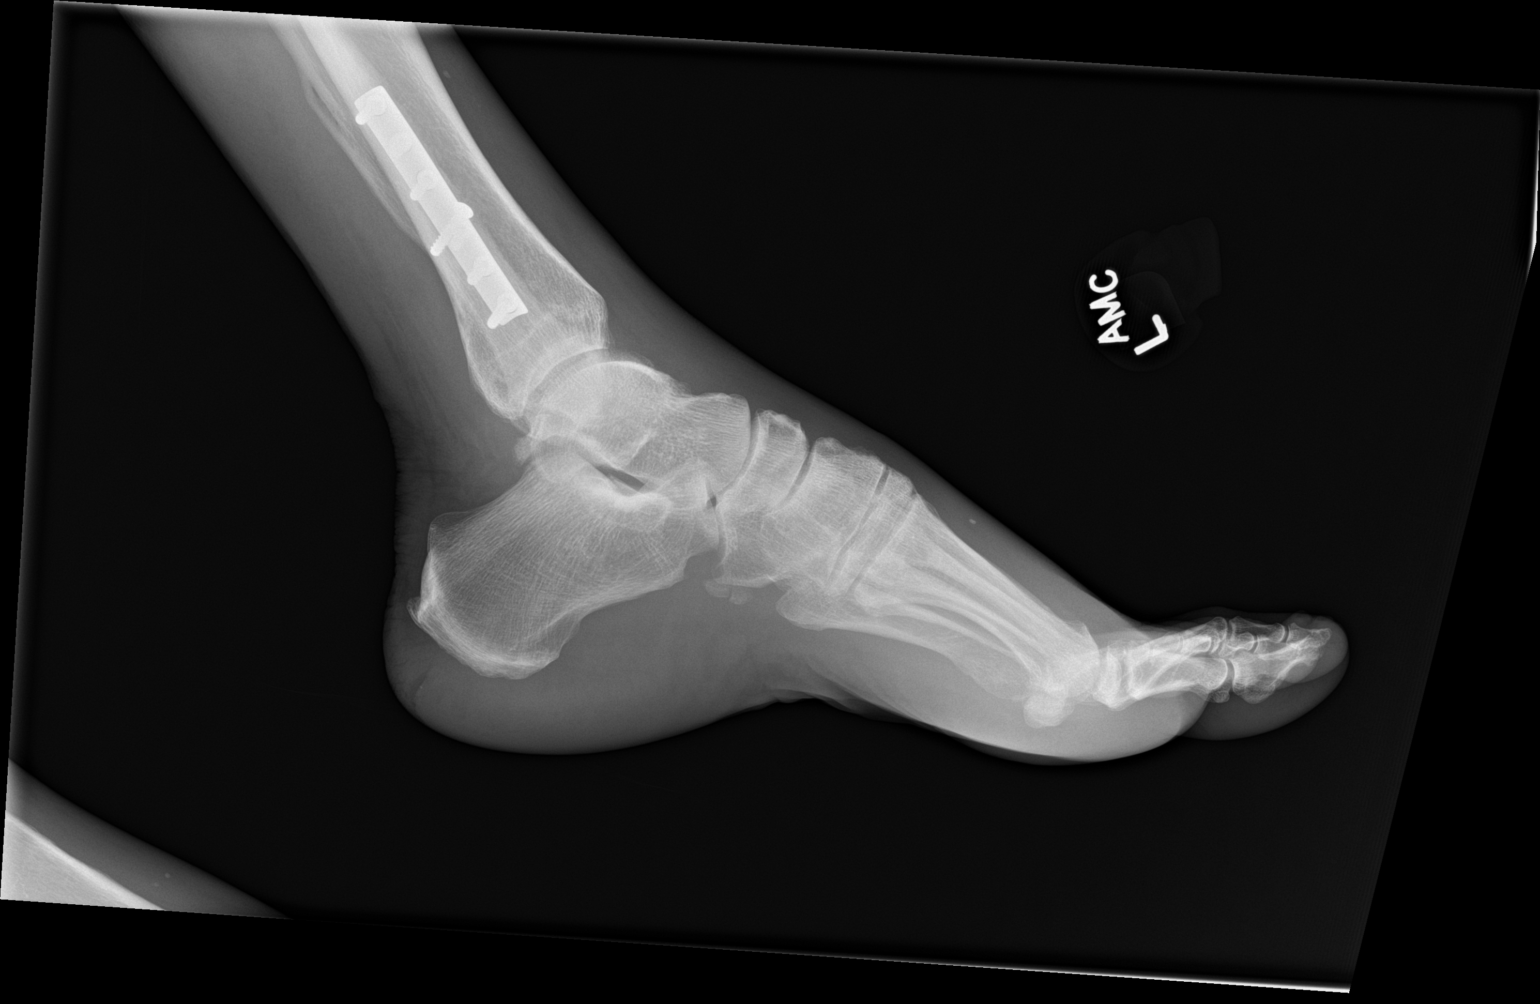

[3 of 3 positions shown; findings below may reference images not displayed]

FINDINGS: There is no evidence for acute fracture or dislocation. The joint
spaces are well maintained. Alignment appears anatomic. Soft tissues
are within normal limits. There is a small posterior calcaneal spur.
Orthopedic fixation plate is partially visualized in the distal
fibula.
IMPRESSION: 1. Negative left foot.

## 2022-04-13 IMAGING — CR DG CHEST 1V
2 series · 2 of 2 positions shown · non-contrast
Comparison: Chest x-ray 08/25/2021.

CLINICAL DATA: GI bleeding.

EXAM:
CHEST  1 VIEW

[x chest ap (1 of 2)]
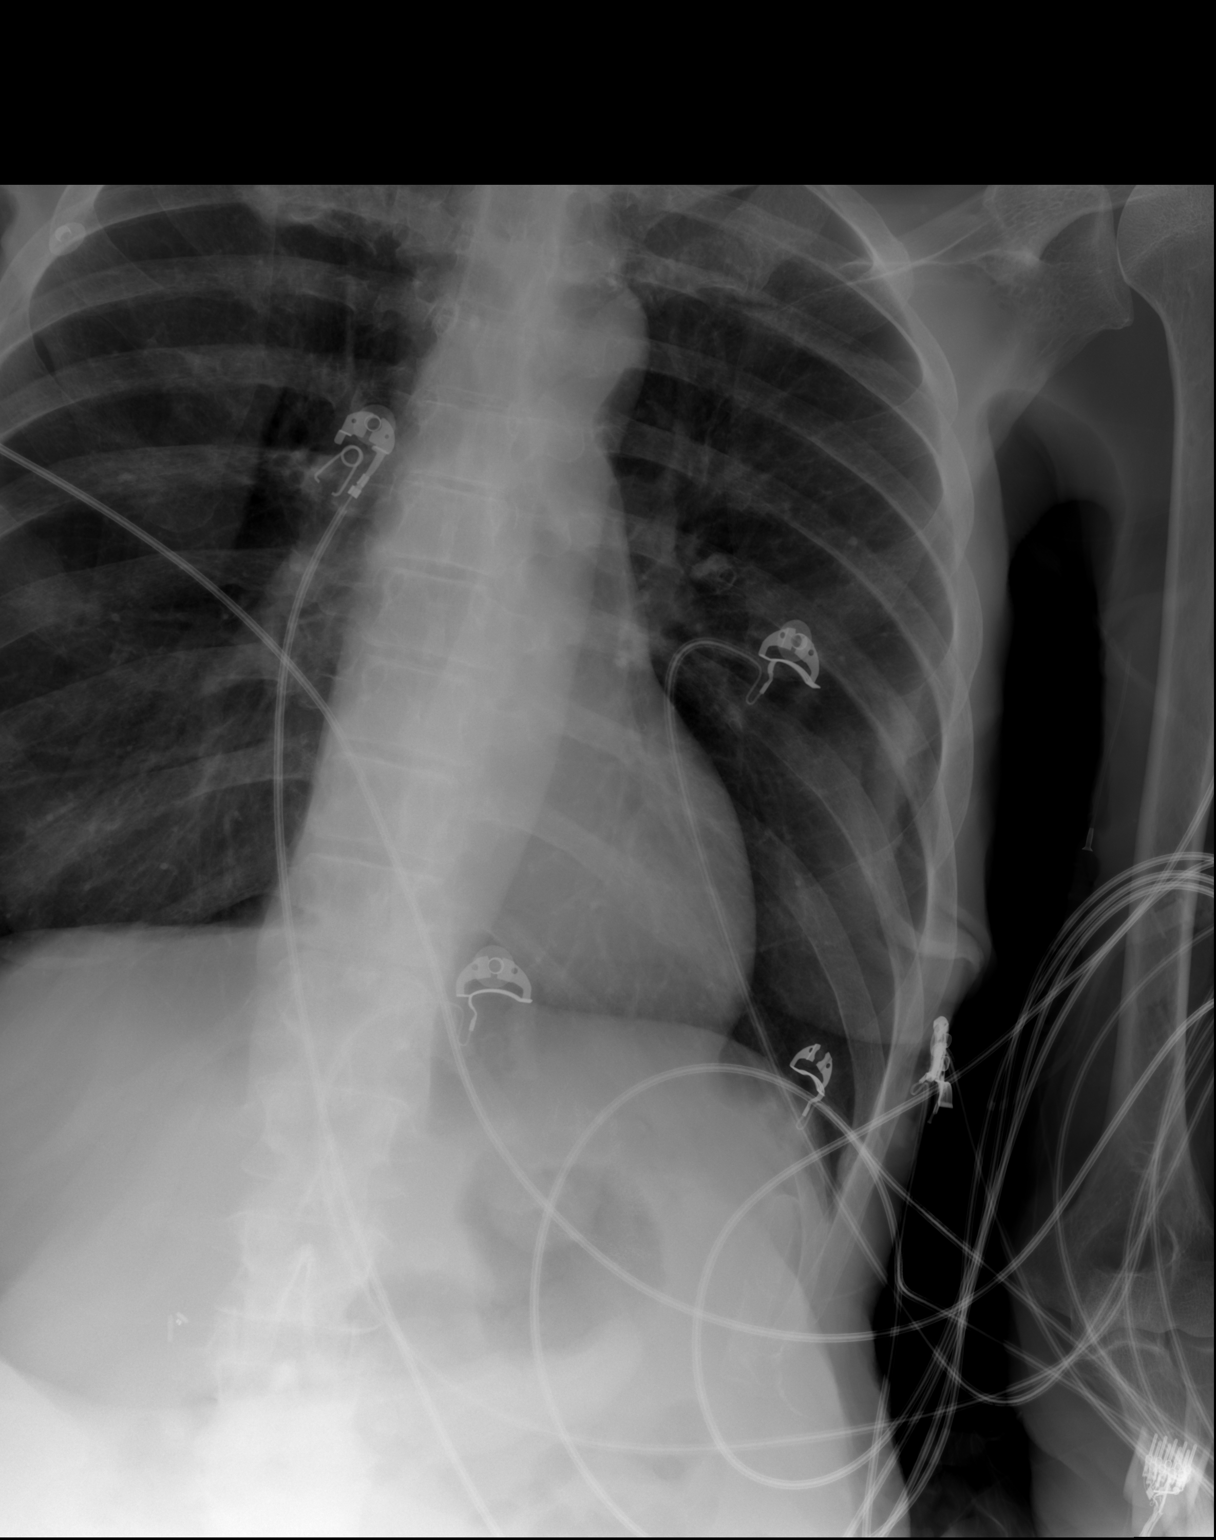

[x chest ap (2 of 2)]
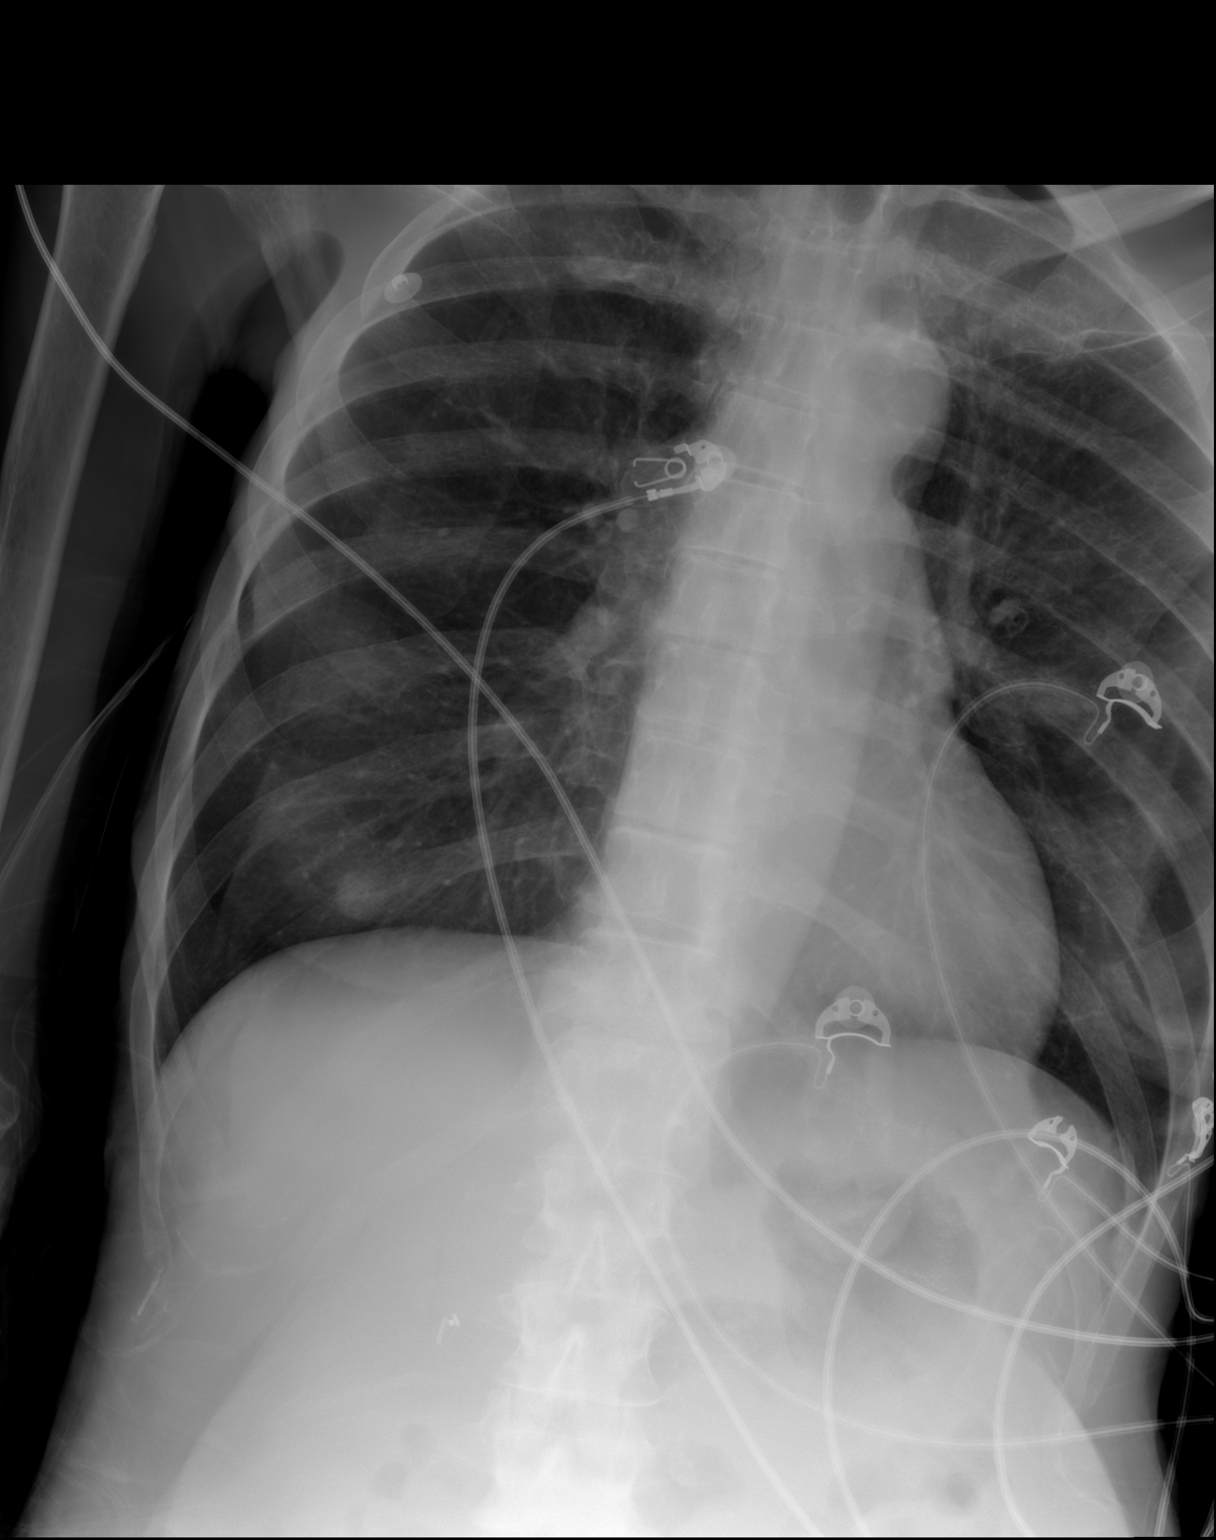

[2 of 2 positions shown; findings below may reference images not displayed]

FINDINGS: The heart size and mediastinal contours are within normal limits.
Both lungs are clear. Nipple shadow projects over the right lower
lung. The visualized skeletal structures are unremarkable.
IMPRESSION: No active disease.

## 2022-04-13 IMAGING — CT CTA GI BLEED
3 of 12 series · 12 of 46 positions shown, 16 images · IV contrast (agent unspecified)
Comparison: CTA abdomen pelvis, 08/25/2021

CLINICAL DATA: GI bleeding

EXAM:
CTA ABDOMEN AND PELVIS WITHOUT AND WITH CONTRAST
TECHNIQUE: Multidetector CT imaging of the abdomen and pelvis was performed
using the standard protocol during bolus administration of
intravenous contrast. Multiplanar reconstructed images and MIPs were
obtained and reviewed to evaluate the vascular anatomy.

[Series 6: axial arterial · axial · arterial · 0.74mm/px · z∈[+1036,+1418]mm · 8 of 235 slices shown]
[im 22/235  soft-tissue]
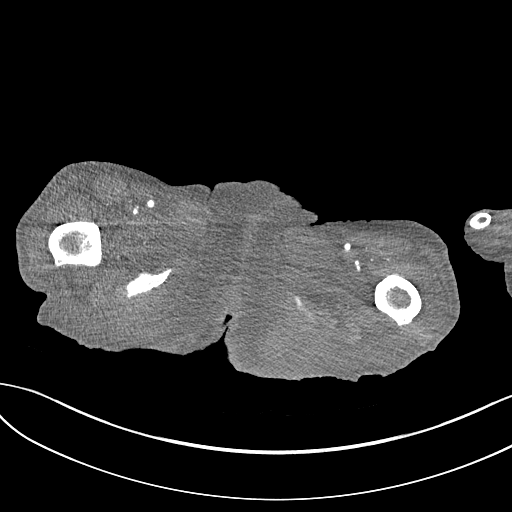
[im 43/235  soft-tissue]
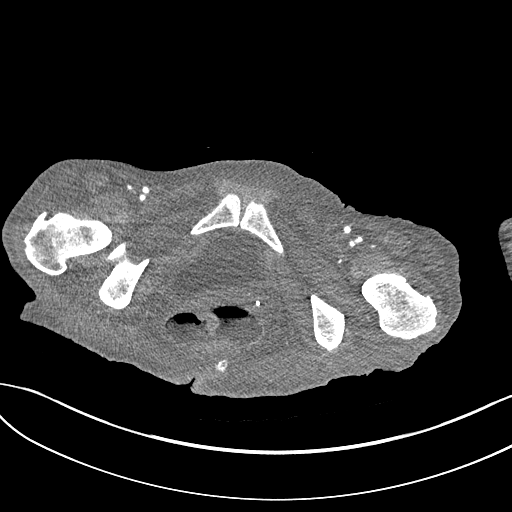
[im 86/235  soft-tissue]
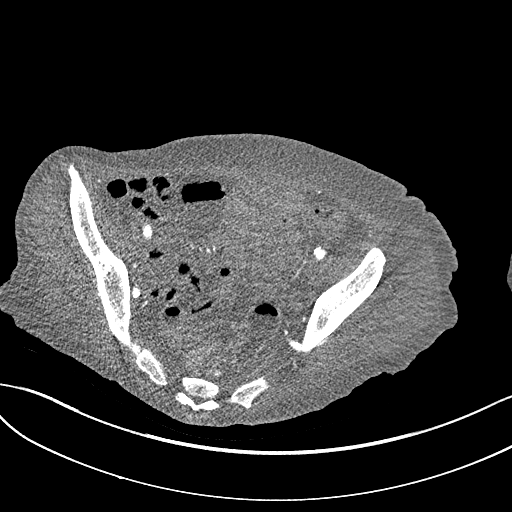
[im 107/235  soft-tissue]
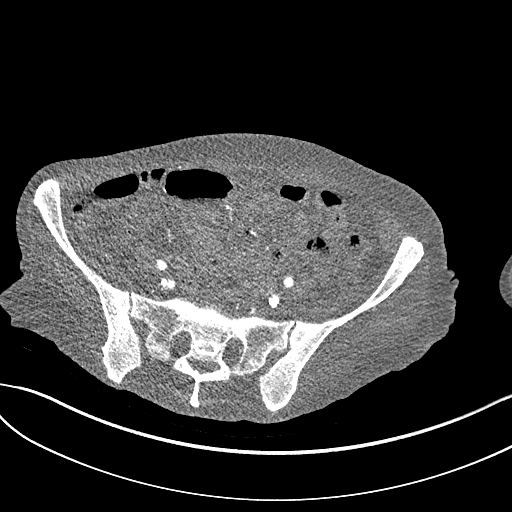
[im 128/235  soft-tissue]
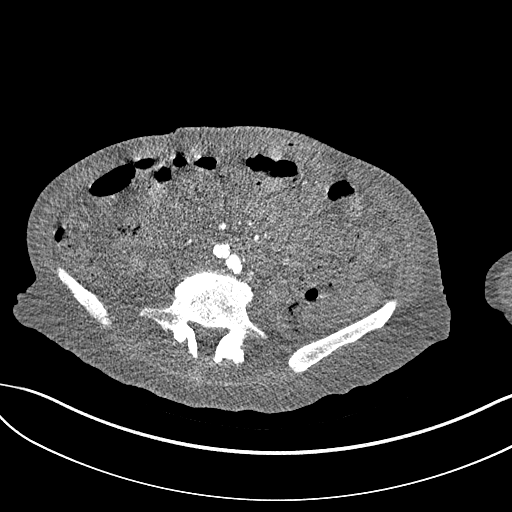
[im 149/235  soft-tissue]
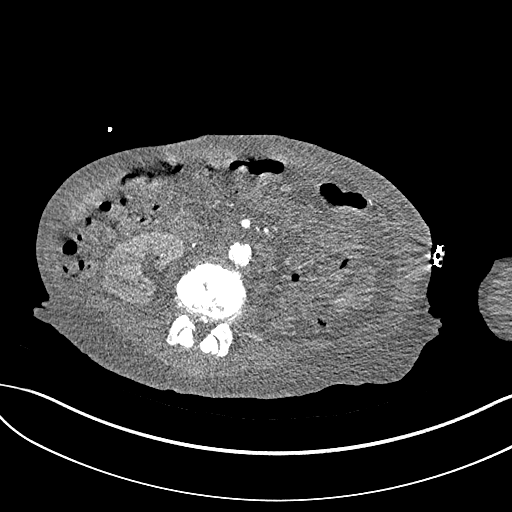
[im 192/235  soft-tissue]
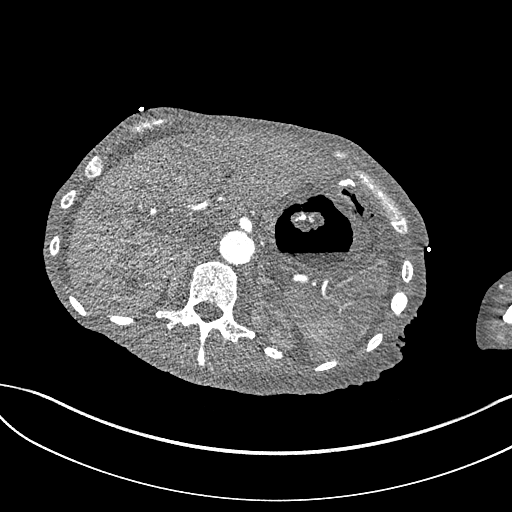
[im 213/235  soft-tissue]
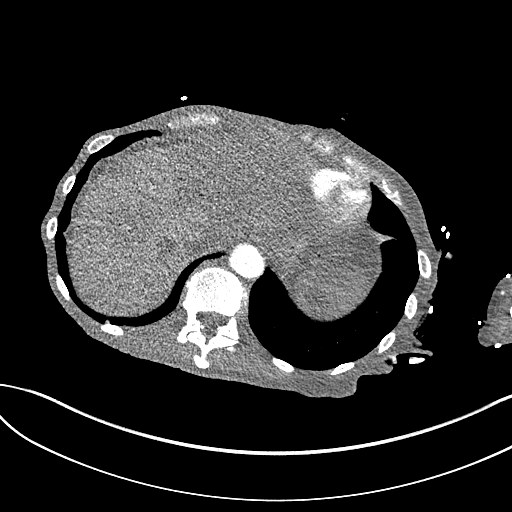

[Series 7: coronal arterial · coronal · arterial · 0.83mm/px · 2 of 126 slices shown, 3 images]
[im 42/126  soft-tissue]
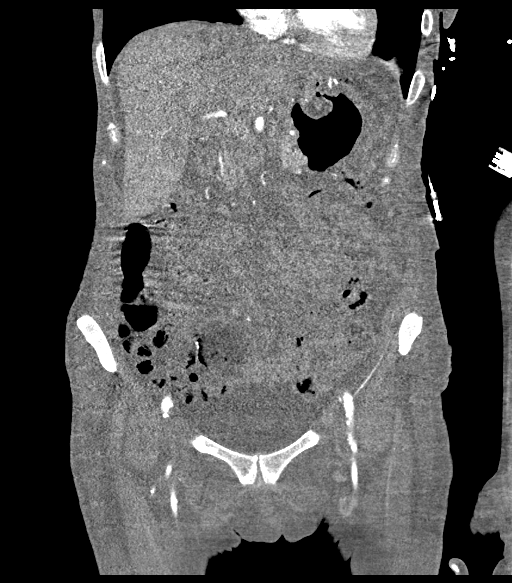
[im 42/126  bone]
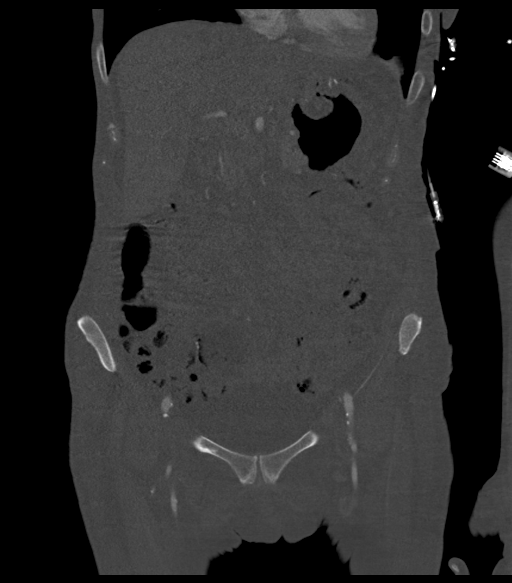
[im 84/126  soft-tissue]
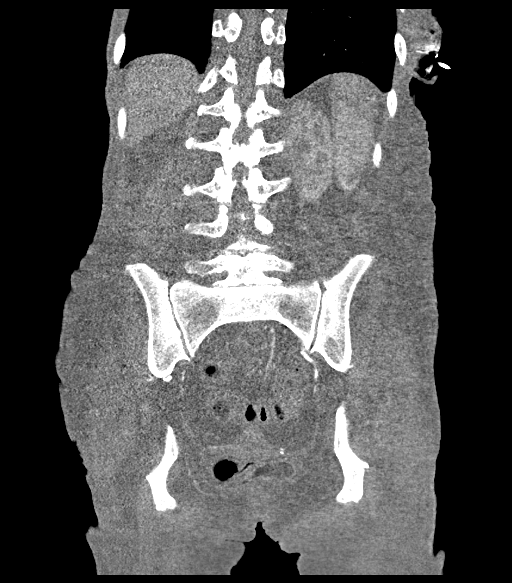

[Series 13: axial portal venous · axial · portal-venous · 0.69mm/px · z∈[+1186,+1322]mm · 2 of 83 slices shown, 5 images]
[im 28/83  soft-tissue]
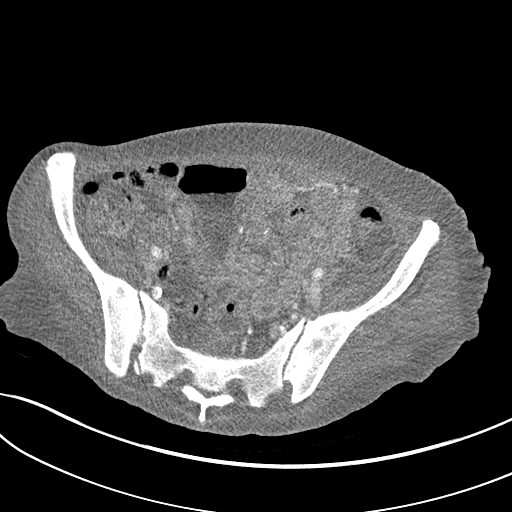
[im 28/83  lung]
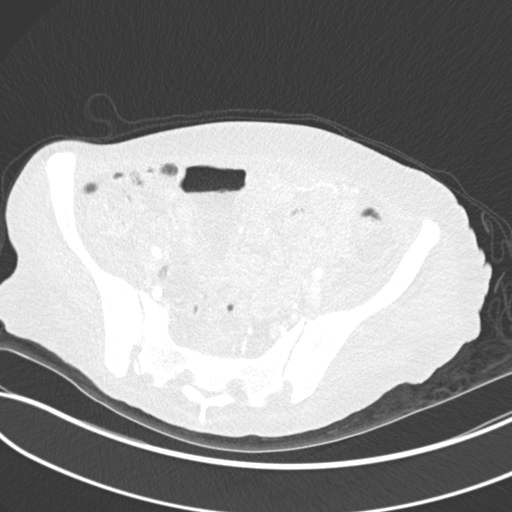
[im 28/83  bone]
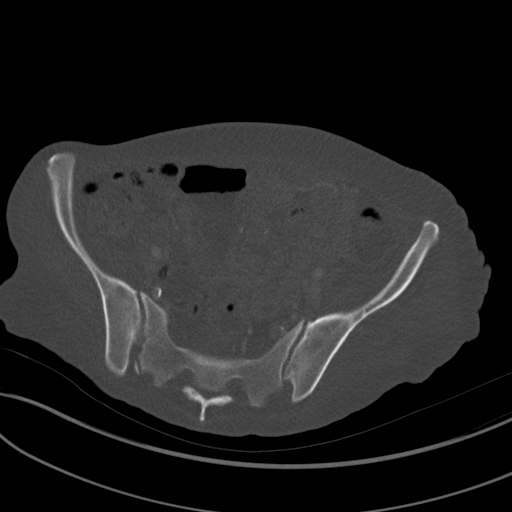
[im 55/83  soft-tissue]
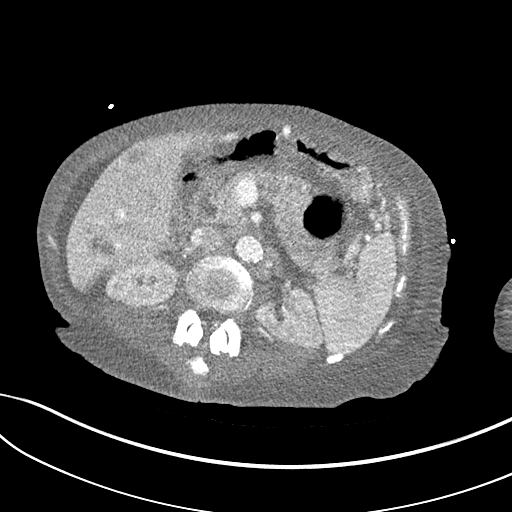
[im 55/83  lung]
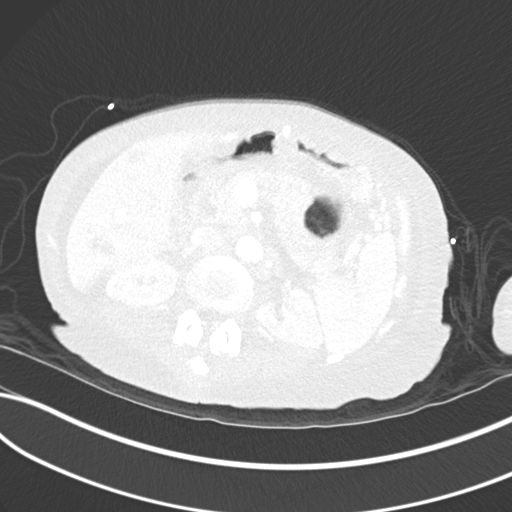

[12 of 46 positions shown; findings below may reference images not displayed]

RADIATION DOSE REDUCTION: This exam was performed according to the
departmental dose-optimization program which includes automated
exposure control, adjustment of the mA and/or kV according to
patient size and/or use of iterative reconstruction technique.

CONTRAST:  100mL OMNIPAQUE IOHEXOL 350 MG/ML SOLN
FINDINGS: Examination is severely limited by photopenia/excessive CT dose
modulation.

VASCULAR

Normal contour and caliber of the abdominal aorta. Moderate mixed
calcific atherosclerosis. No evidence of aneurysm, dissection, or
other acute aortic pathology. Standard branching pattern of the
abdominal aorta with solitary bilateral renal arteries.

Review of the MIP images confirms the above findings.

NON-VASCULAR

Lower chest: No acute abnormality.

Hepatobiliary: Multiple low-attenuation lesions throughout the
liver, as seen on recent prior examination. Largest lesion in the
anterior liver, hepatic segment IV B, measuring 1.9 x 1.8 cm (series
13, image 23). Large burden of thrombus within the central portal
vein (series 13, image 26) as well as the right portal vein and
branches (series 13, image 21). Status post cholecystectomy. Mild
postoperative biliary ductal dilatation.

Pancreas: Unremarkable. No pancreatic ductal dilatation or
surrounding inflammatory changes.

Spleen: Normal in size without significant abnormality.

Adrenals/Urinary Tract: Adrenal glands are unremarkable. Kidneys are
normal, without renal calculi, solid lesion, or hydronephrosis.
Bladder is unremarkable.

Stomach/Bowel: Status post Roux type gastric bypass. Appendix
appears normal. No evidence of bowel wall thickening, distention, or
inflammatory changes. No intraluminal contrast extravasation to
localize GI bleeding.

Lymphatic: No enlarged abdominal or pelvic lymph nodes.

Reproductive: No mass or other significant abnormality.

Other: No abdominal wall hernia or abnormality. Moderate volume
ascites throughout the abdomen and pelvis.

Musculoskeletal: No acute osseous findings.
IMPRESSION: 1. Examination is severely limited by photopenia/excessive CT dose
modulation.
2. Within this limitation, no evidence of acute aortic pathology.
3. No intraluminal contrast extravasation to localize GI bleeding.
4. Large burden of thrombus within the central portal vein as well
as the right portal vein and branches, as seen on recent examination
dated 08/25/2021.
[DATE]. Multiple low-attenuation lesions throughout the liver, as seen on
recent prior examination, highly concerning for hepatic abscesses or
metastases.
6. Moderate volume ascites throughout the abdomen and abdomen and
pelvis, similar to prior.

## 2022-04-17 IMAGING — MR MR ABDOMEN W/ CM
21 series · 48 of 48 positions shown · IV contrast (5 ML GDAVIST)
Comparison: CT abdomen pelvis, 08/31/2021

CLINICAL DATA: Abnormal liver CT, portal vein thrombosis, liver
lesions

EXAM:
MRI ABDOMEN WITH AND WITHOUT CONTRAST
TECHNIQUE: Multiplanar multisequence MR imaging of the abdomen was performed
both prior to and following after the administration of intravenous
contrast.

[Series 3: T2 · coronal · 6.0mm · 1.56mm/px · 1 of 30 slices shown]
[im 1/30]
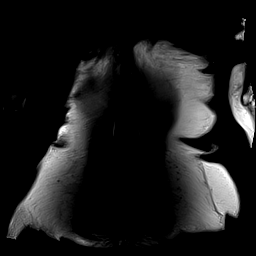

[Series 4: T2 fat-sat · axial · 6.0mm · 1.56mm/px · 1 of 37 slices shown]
[im 1/37]
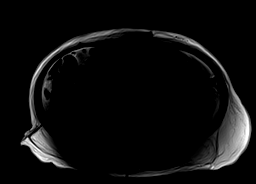

[Series 5: T1 · axial · 3.0mm · 1.25mm/px · 1 of 80 slices shown (1 of 2)]
[im 1/80]
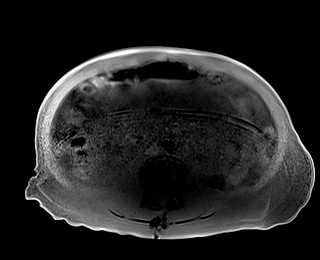

[Series 6: T1 · axial · 3.0mm · 1.25mm/px · 1 of 80 slices shown (2 of 2)]
[im 1/80]
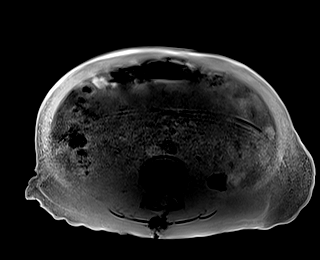

[Series 7: DWI · axial · 6.0mm · 1.49mm/px · 1 of 72 slices shown (1 of 2)]
[im 1/72]
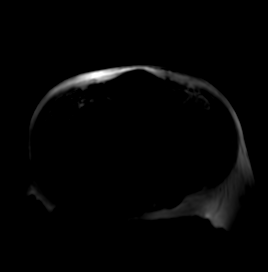

[Series 8: DWI · axial · 6.0mm · 1.49mm/px · 1 of 36 slices shown (2 of 2)]
[im 1/36]
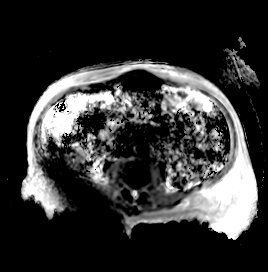

[Series 9: bSSFP · axial · 4.0mm · 0.84mm/px · z∈[-91,+120]mm · 2 of 54 slices shown]
[im 1/54]
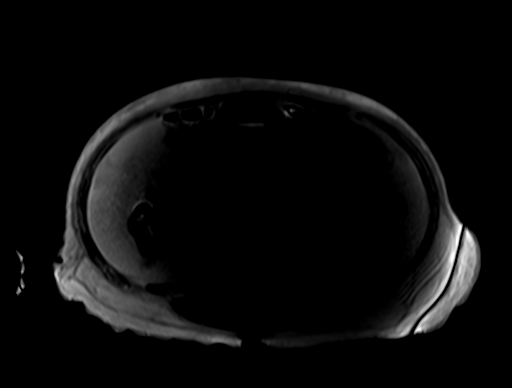
[im 54/54]
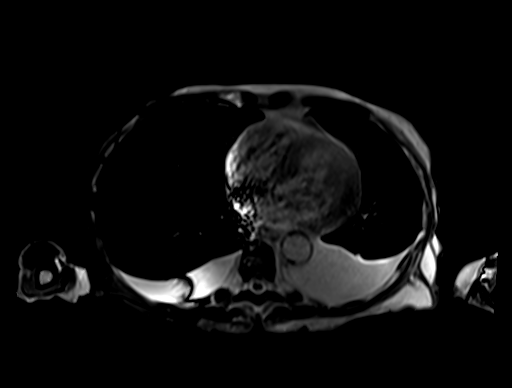

[Series 10: T1 dynamic · axial · 3.0mm · 1.25mm/px · z∈[-117,+143]mm · 3 of 88 slices shown (1 of 14)]
[im 1/88]
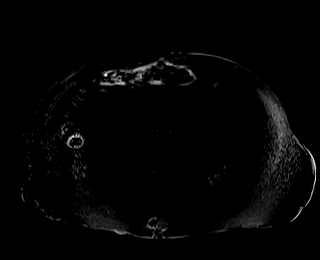
[im 44/88]
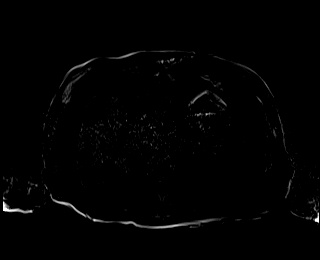
[im 88/88]
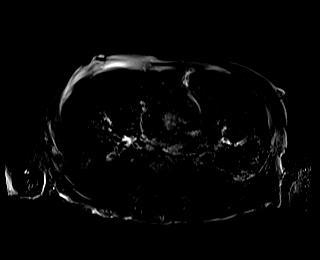

[Series 11: T1 dynamic · axial · 3.0mm · 1.25mm/px · z∈[-117,+143]mm · 3 of 88 slices shown (2 of 14)]
[im 1/88]
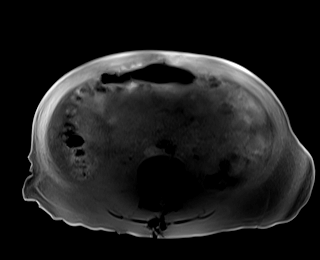
[im 44/88]
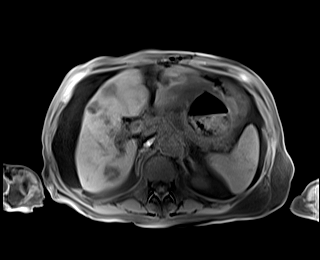
[im 88/88]
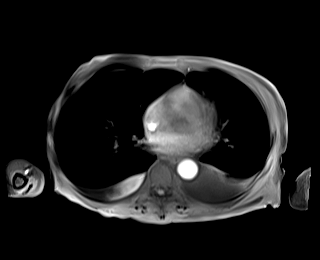

[Series 14: T1 dynamic · axial · 3.0mm · 1.25mm/px · z∈[-117,+143]mm · 3 of 88 slices shown (3 of 14)]
[im 1/88]
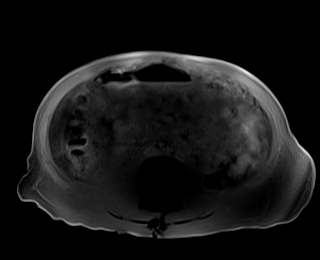
[im 44/88]
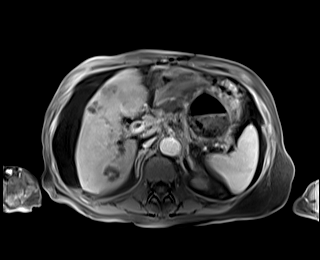
[im 88/88]
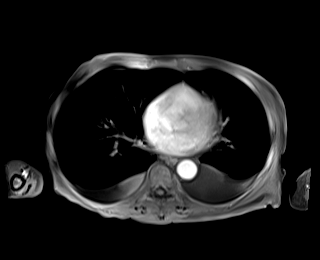

[Series 15: T1 dynamic · axial · 3.0mm · 1.25mm/px · z∈[-117,+143]mm · 3 of 88 slices shown (4 of 14)]
[im 1/88]
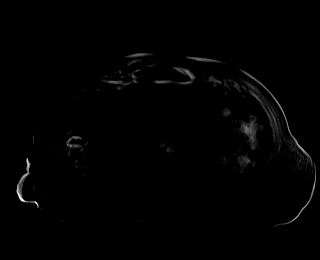
[im 44/88]
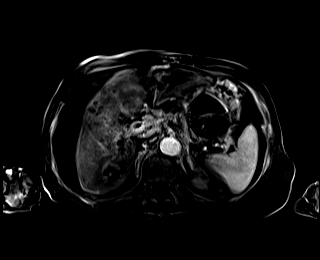
[im 88/88]
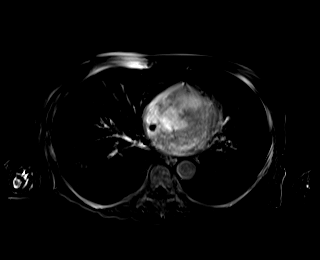

[Series 18: T1 dynamic · axial · 3.0mm · 1.25mm/px · z∈[-117,+143]mm · 3 of 88 slices shown (5 of 14)]
[im 1/88]
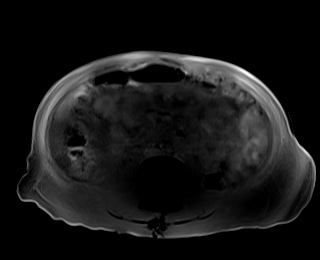
[im 44/88]
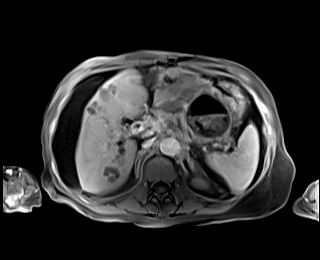
[im 88/88]
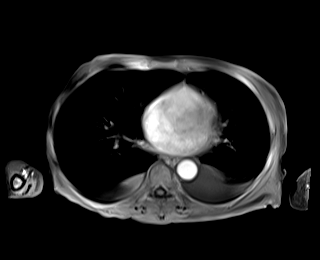

[Series 19: T1 dynamic · axial · 3.0mm · 1.25mm/px · z∈[-117,+143]mm · 3 of 88 slices shown (6 of 14)]
[im 1/88]
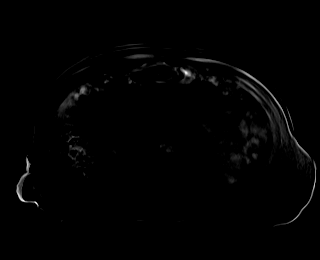
[im 44/88]
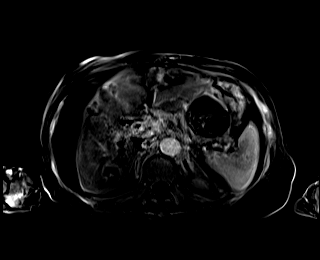
[im 88/88]
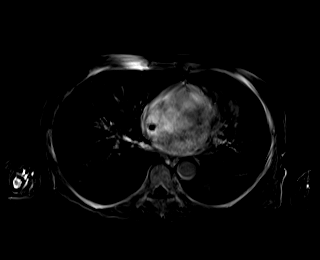

[Series 22: T1 dynamic · axial · 3.0mm · 1.25mm/px · z∈[-117,+143]mm · 3 of 88 slices shown (7 of 14)]
[im 1/88]
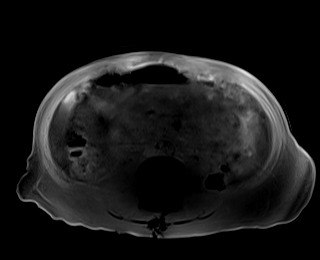
[im 44/88]
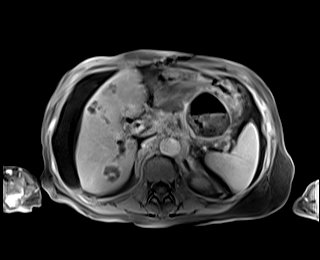
[im 88/88]
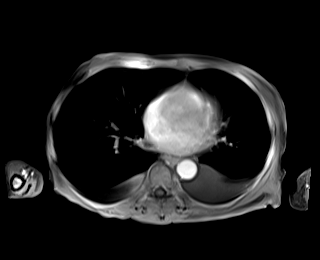

[Series 23: T1 dynamic · axial · 3.0mm · 1.25mm/px · z∈[-117,+143]mm · 3 of 88 slices shown (8 of 14)]
[im 1/88]
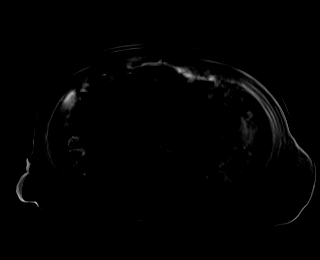
[im 44/88]
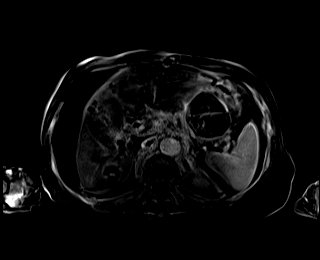
[im 88/88]
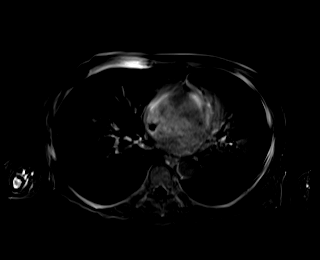

[Series 24: T1 dynamic · coronal · 3.0mm · 1.41mm/px · 2 of 72 slices shown (9 of 14)]
[im 1/72]
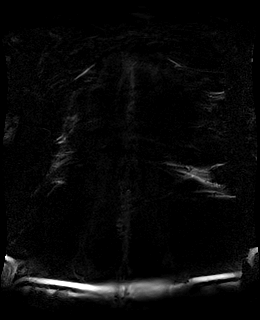
[im 72/72]
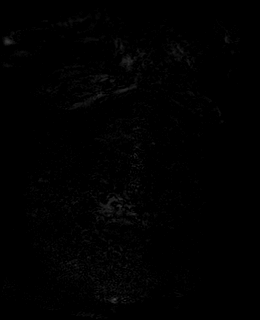

[Series 25: T1 dynamic · coronal · 3.0mm · 1.41mm/px · 2 of 72 slices shown (10 of 14)]
[im 1/72]
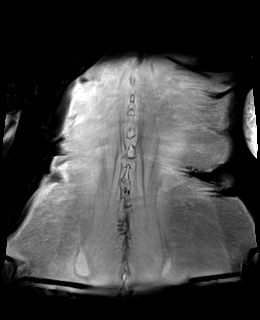
[im 72/72]
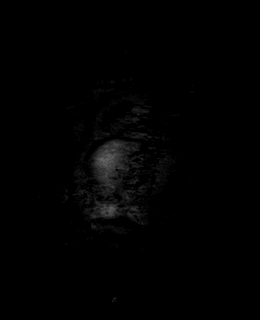

[Series 28: T1 dynamic · axial · 3.0mm · 1.25mm/px · z∈[-117,+143]mm · 3 of 88 slices shown (11 of 14)]
[im 1/88]
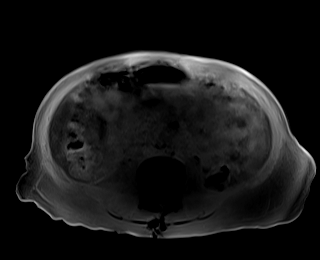
[im 44/88]
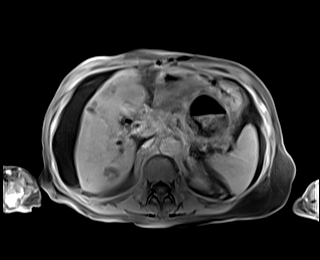
[im 88/88]
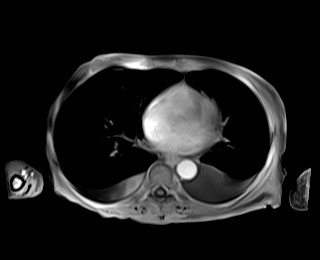

[Series 29: T1 dynamic · axial · 3.0mm · 1.25mm/px · z∈[-117,+143]mm · 3 of 88 slices shown (12 of 14)]
[im 1/88]
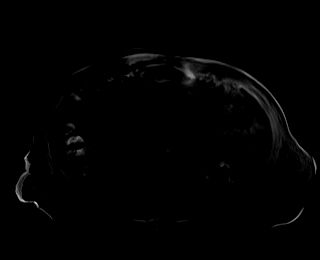
[im 44/88]
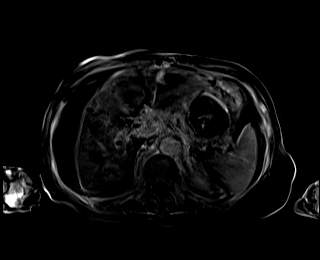
[im 88/88]
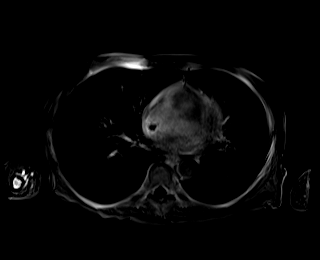

[Series 32: T1 dynamic · axial · 3.0mm · 1.25mm/px · z∈[-117,+143]mm · 3 of 88 slices shown (13 of 14)]
[im 1/88]
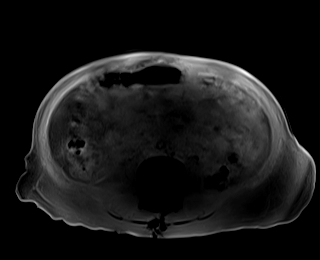
[im 44/88]
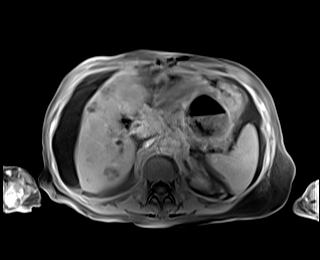
[im 88/88]
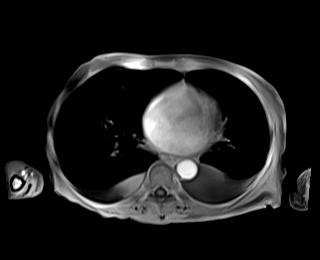

[Series 33: T1 dynamic · axial · 3.0mm · 1.25mm/px · z∈[-117,+143]mm · 3 of 88 slices shown (14 of 14)]
[im 1/88]
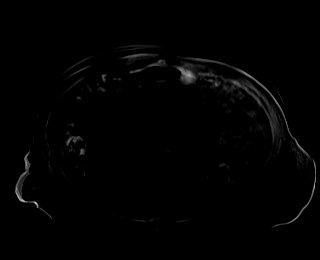
[im 44/88]
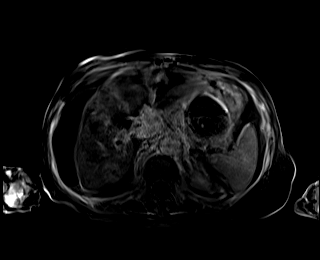
[im 88/88]
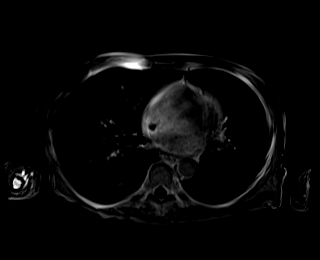

[48 of 48 positions shown; findings below may reference images not displayed]

FINDINGS: Lower chest: New small right, moderate left pleural effusions and
associated atelectasis or consolidation.

Hepatobiliary: Multiple heterogeneous masses throughout the liver
with rim and thick internal septal enhancement, largest lesion in
the anterior liver, hepatic segment IV B measuring 2.2 x 2.0 cm
(series 23, image 39). There is again seen a large burden of
expansile thrombus within the left and right portal veins as well as
the central portal vein (series 23, image 43). Status post
cholecystectomy. Postoperative biliary ductal dilatation

Pancreas: No mass, inflammatory changes, or other parenchymal
abnormality identified.No pancreatic ductal dilatation.

Spleen:  Within normal limits in size and appearance.

Adrenals/Urinary Tract: Normal adrenal glands. No renal masses or
suspicious contrast enhancement identified. No evidence of
hydronephrosis.

Stomach/Bowel: Roux type gastric bypass. Visualized portions within
the abdomen are otherwise unremarkable.

Vascular/Lymphatic: Enlarged left retroperitoneal nodes measuring up
to 1.3 x 1.1 cm (series 15, image 67). Aortic atherosclerosis. No
abdominal aortic aneurysm demonstrated.

Other: Large volume ascites throughout the abdomen, increased
compared to prior examination.

Musculoskeletal: No suspicious osseous lesions identified.
IMPRESSION: 1. Multiple heterogeneous masses throughout the liver with rim and
thick internal septal enhancement. There is additionally a large
burden of expansile thrombus in the left and right portal veins as
well as the central portal vein. Liver masses appear to be of solid
composition and are highly concerning for metastases with tumor
thrombus; abscesses remain a general differential consideration.
2. Enlarged left retroperitoneal lymph nodes, suspicious for nodal
metastases
3. Large volume ascites throughout the abdomen, increased compared
to prior examination.
4. New small right, moderate left pleural effusions and associated
atelectasis or consolidation.

## 2022-04-20 IMAGING — US US PARACENTESIS
1 series · 4 of 4 positions shown · non-contrast
Comparison: none

INDICATION: Ascites, prior to liver biopsy

[Series 1: us paracentesis mc & wl · 4 of 4 slices shown]
[im 1/4]
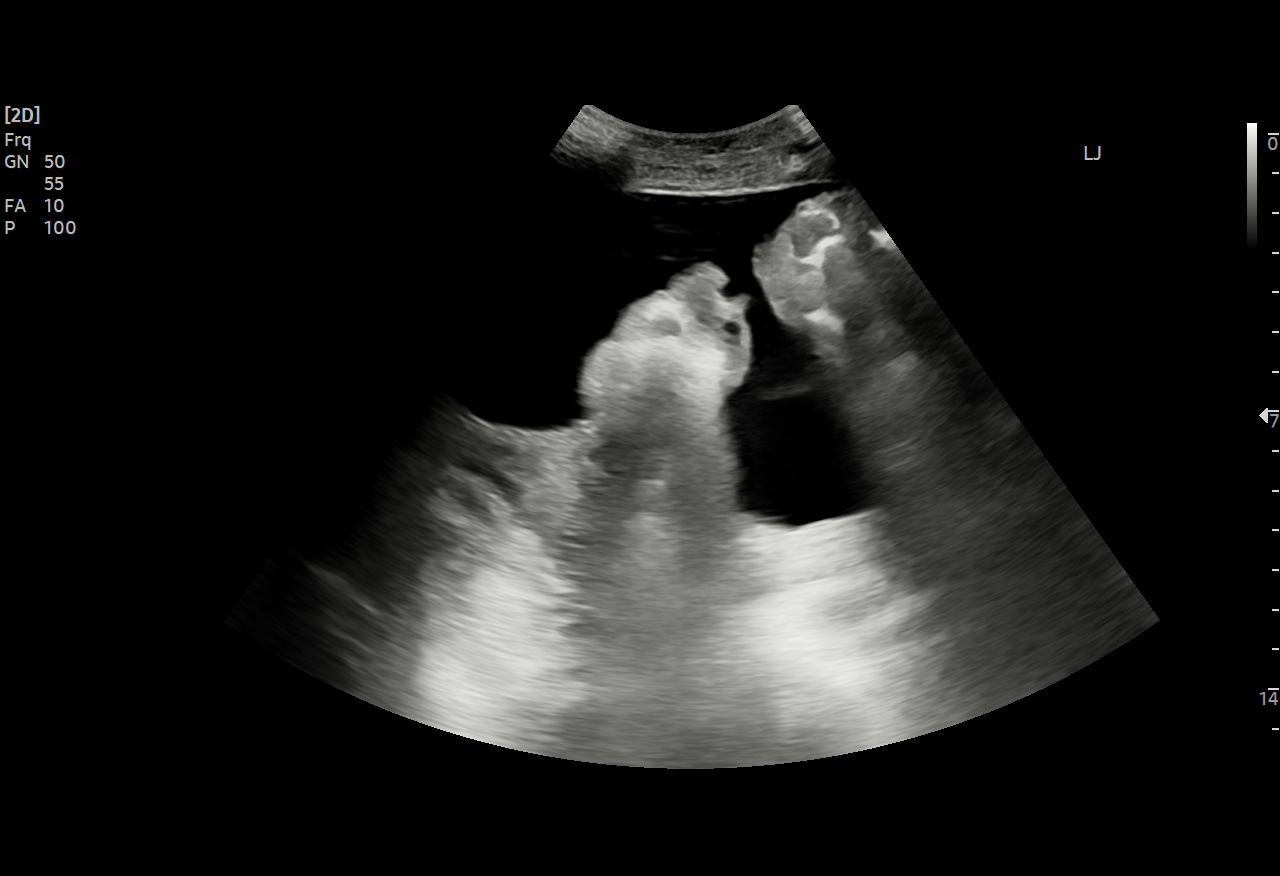
[im 2/4]
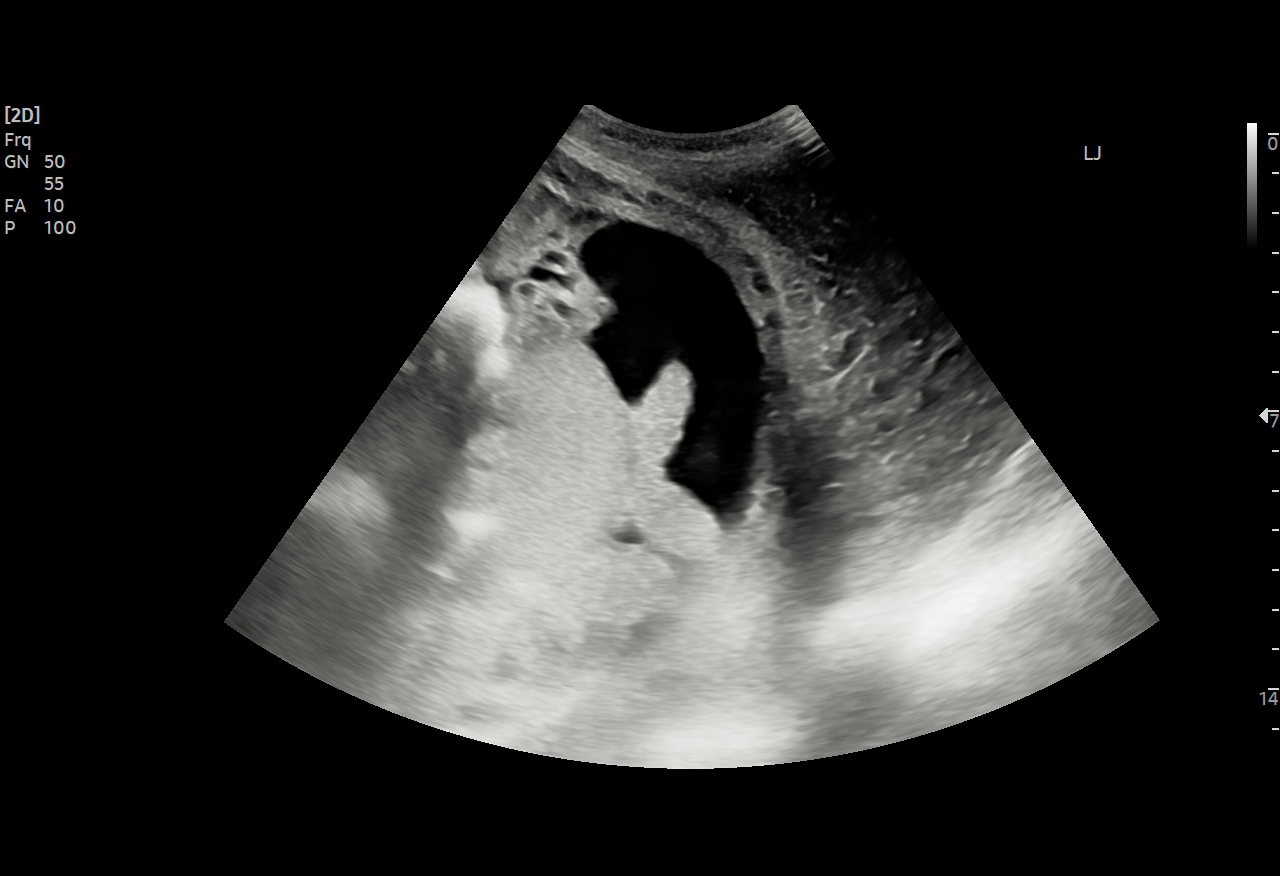
[im 3/4]
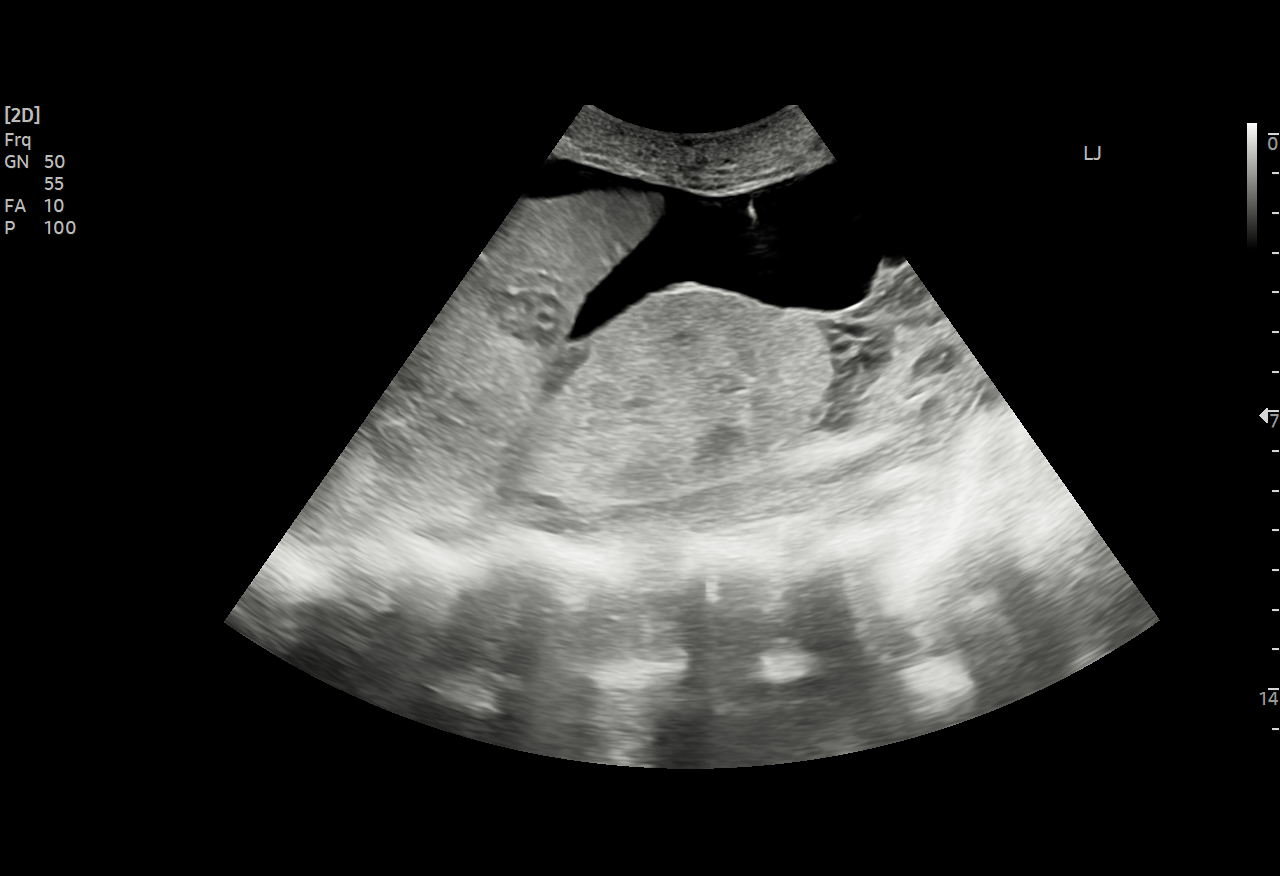
[im 4/4]
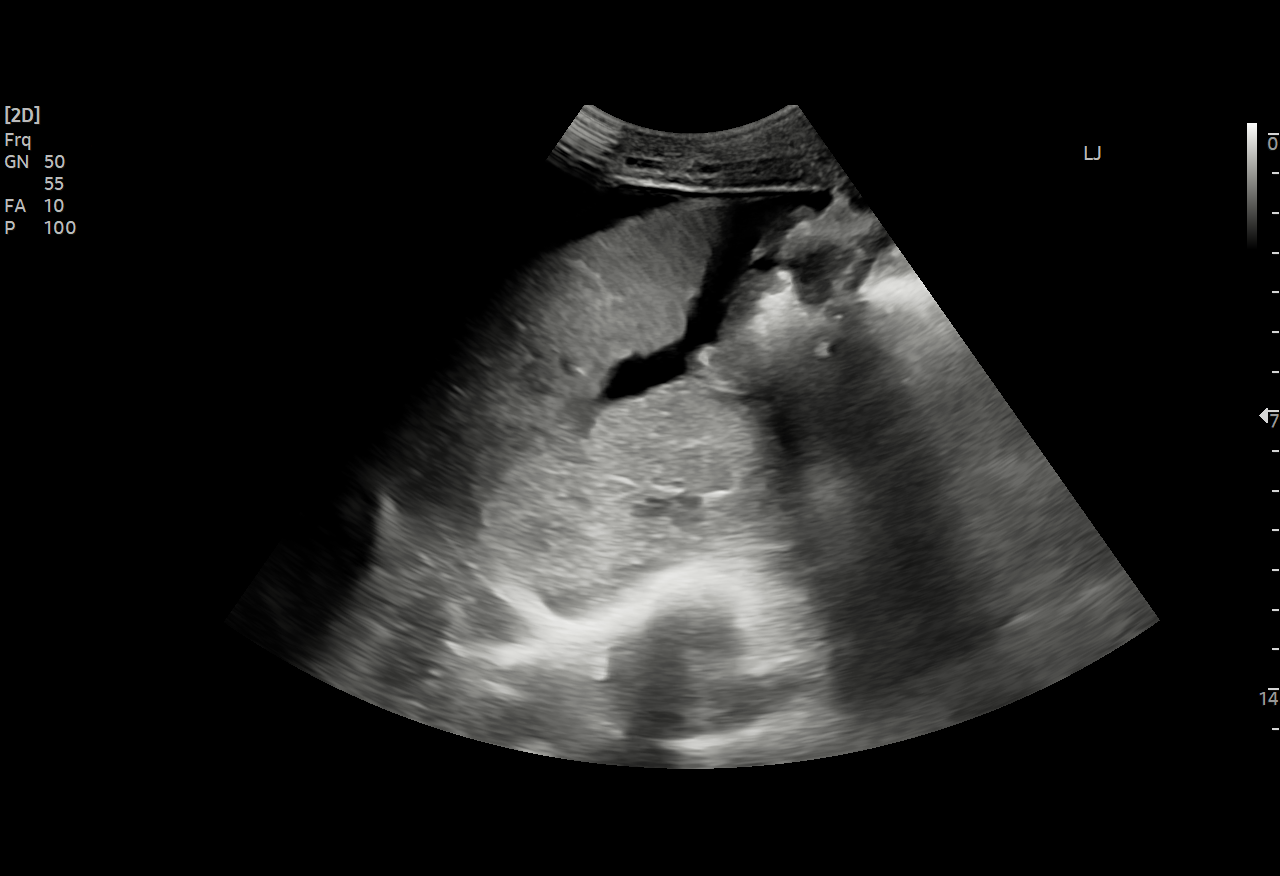

[4 of 4 positions shown; findings below may reference images not displayed]

EXAM:
ULTRASOUND GUIDED THERAPEUTIC RLQ PARACENTESIS

MEDICATIONS:
None.

COMPLICATIONS:
None immediate.

PROCEDURE:
Informed written consent was obtained from the patient after a
discussion of the risks, benefits and alternatives to treatment. A
timeout was performed prior to the initiation of the procedure.

Initial ultrasound scanning demonstrates a moderate amount of
ascites within the right lower abdominal quadrant. The right lower
abdomen was prepped and draped in the usual sterile fashion. 1%
lidocaine was used for local anesthesia.

Following this, a 19 gauge, 7-cm, Yueh catheter was introduced. An
ultrasound image was saved for documentation purposes. The
paracentesis was performed. The catheter was removed and a dressing
was applied. An additional 1L was removed from the RUQ using similar
technique.
The patient tolerated the procedure well without immediate post
procedural complication.
FINDINGS: A total of approximately 2L of cloudy fluid was removed. Samples
were sent to the laboratory.
IMPRESSION: Successful ultrasound-guided paracentesis yielding 2 liters of
peritoneal fluid.

## 2022-04-20 IMAGING — US US BIOPSY CORE LIVER
1 series · 15 of 24 positions shown · non-contrast
Comparison: none

CLINICAL DATA: Portal vein thrombosis, multiple liver lesions

EXAM:
ULTRASOUND-GUIDED CORE LIVER BIOPSY
TECHNIQUE: An ultrasound guided liver biopsy was thoroughly discussed with the
patient and questions were answered. The benefits, risks,
alternatives, and complications were also discussed. The patient
understands and wishes to proceed with the procedure. A verbal as
well as written consent was obtained.

[Series 1: us biopsy mc & wl · 15 of 24 slices shown]
[im 1/24]
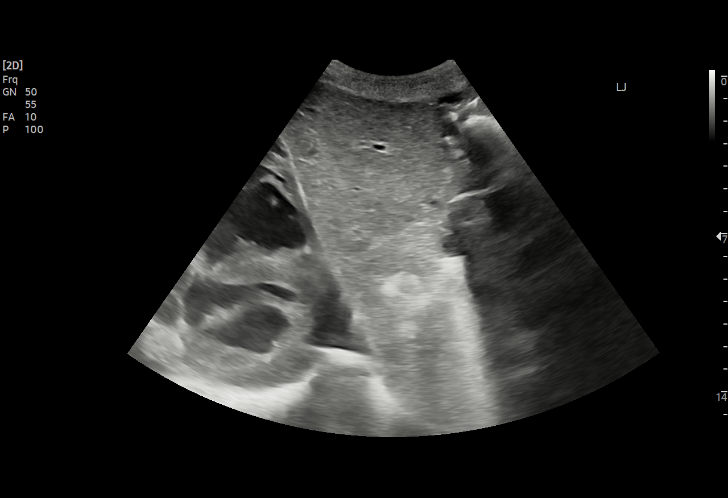
[im 3/24]
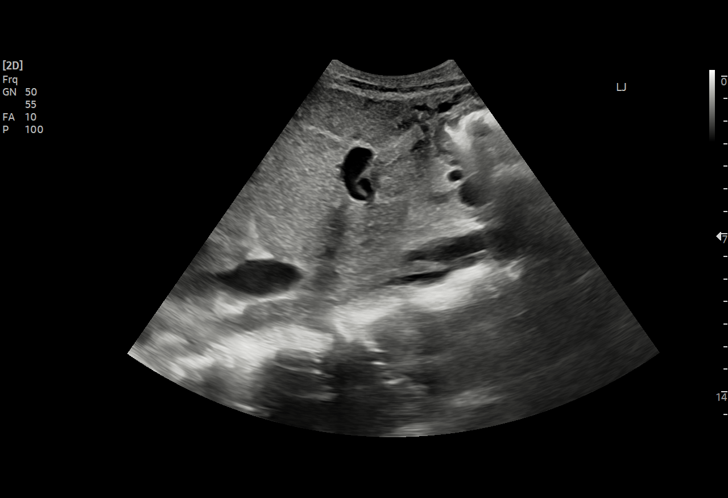
[im 5/24]
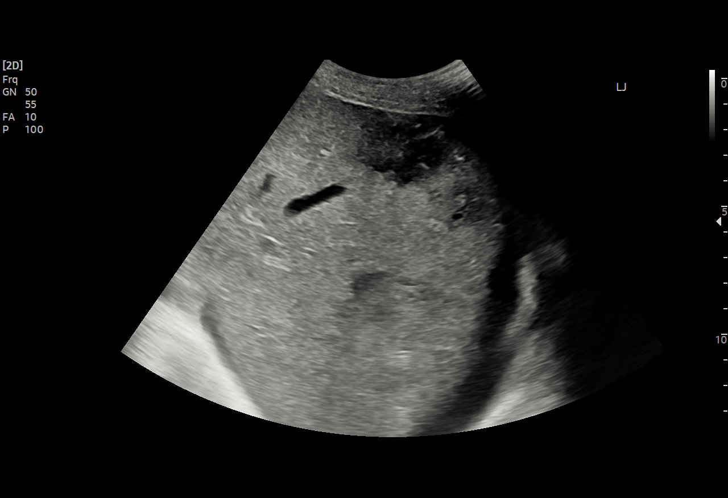
[im 6/24]
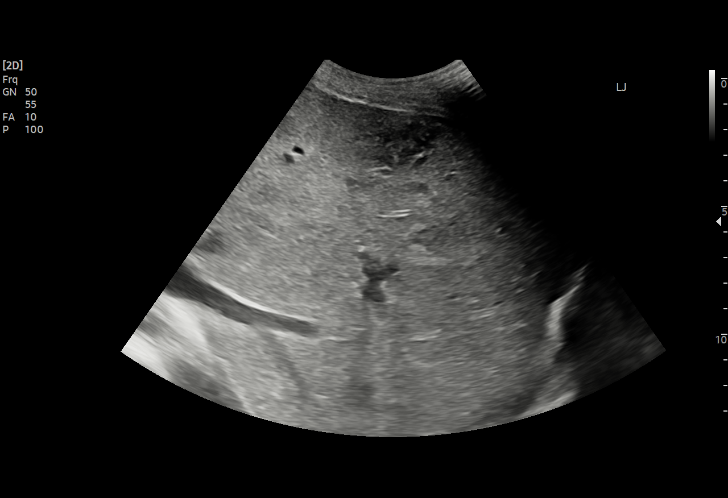
[im 8/24]
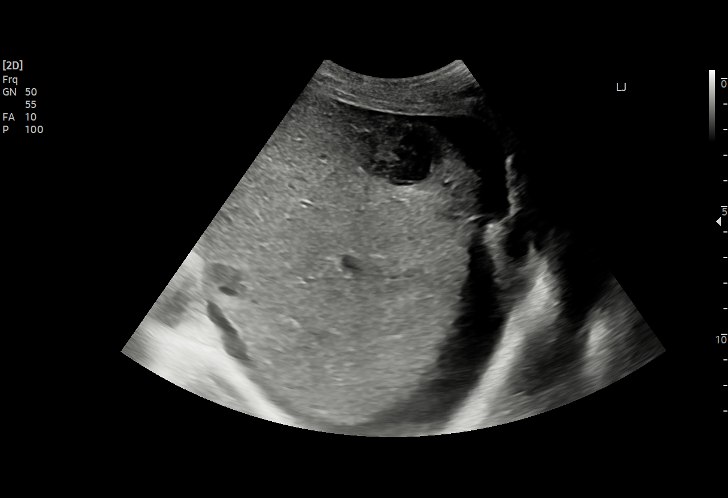
[im 9/24]
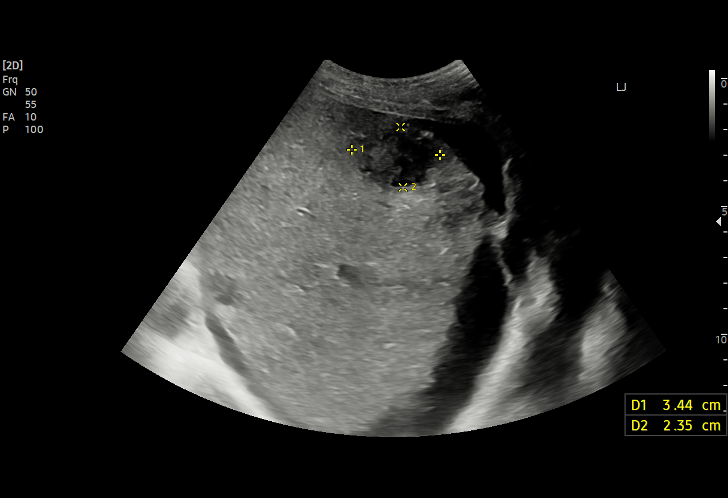
[im 11/24]
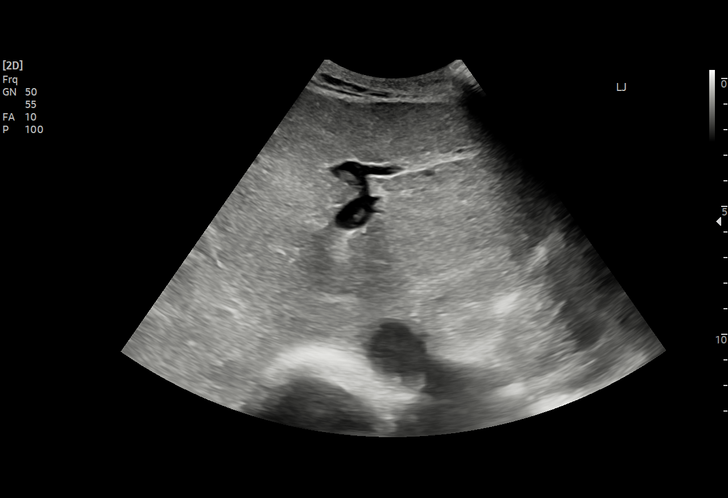
[im 13/24]
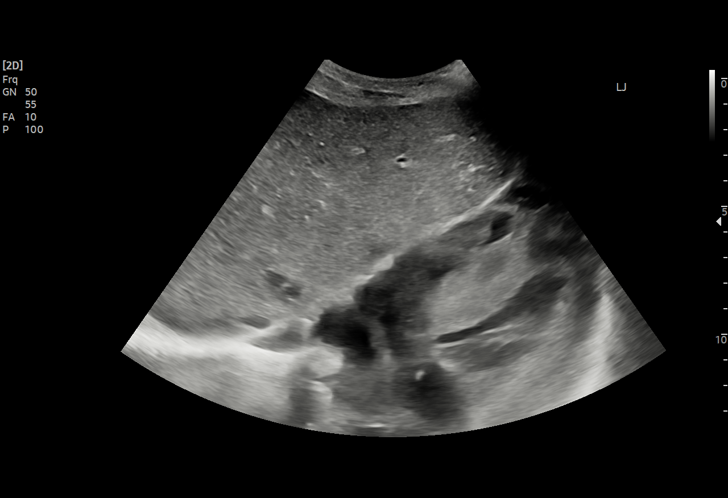
[im 14/24]
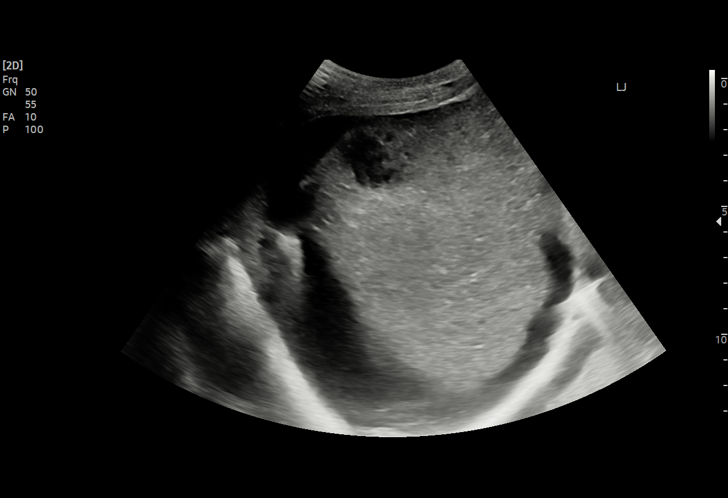
[im 16/24]
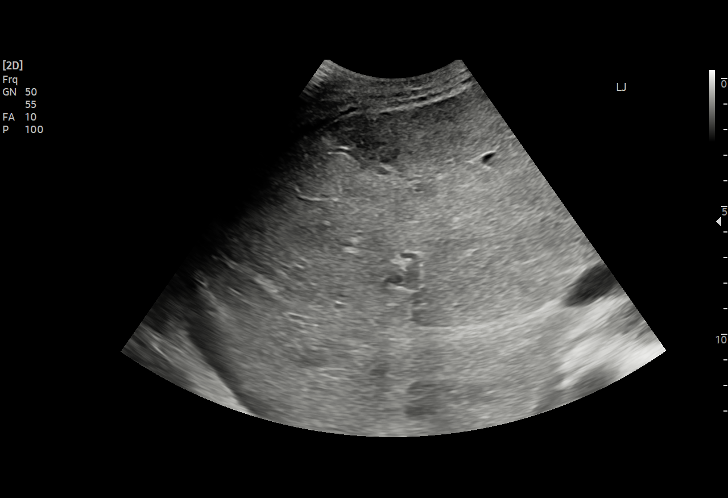
[im 17/24]
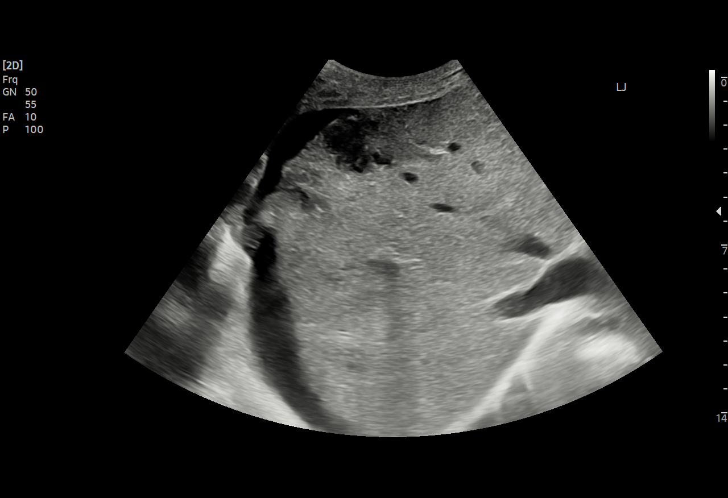
[im 19/24]
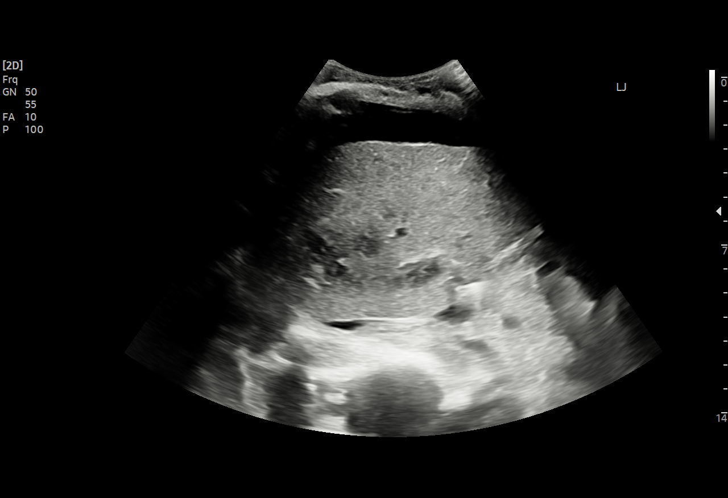
[im 21/24]
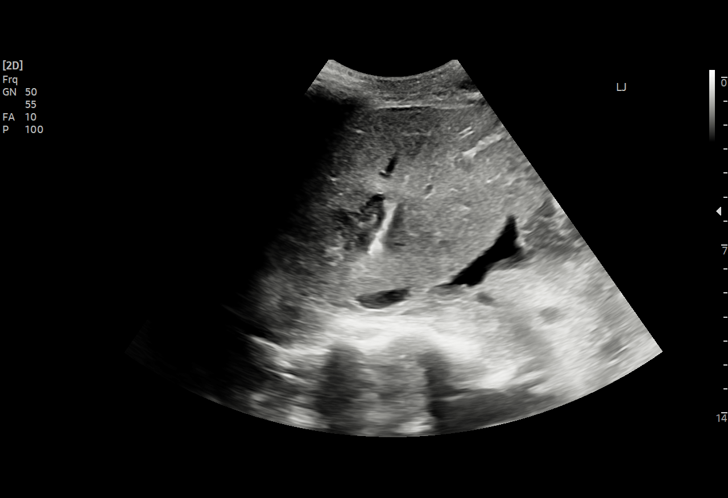
[im 22/24]
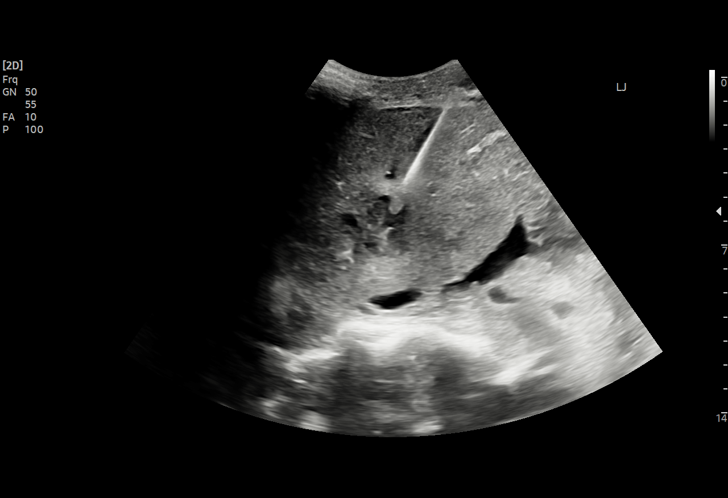
[im 24/24]
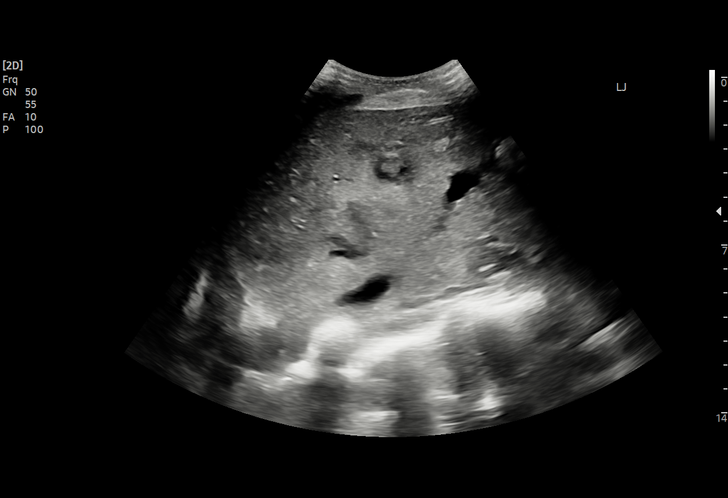

[15 of 24 positions shown; findings below may reference images not displayed]

Survey ultrasound of the liver was performed, a representative
lesion localized, and an appropriate skin entry site was determined.
Paracentesis had been performed to removed ascites from around the
liver. Skin site was marked, prepped with chlorhexidine, and draped
in usual sterile fashion, and infiltrated locally with 1% lidocaine.

Intravenous Fentanyl 244mcg and Versed 2mg were administered as
conscious sedation during continuous monitoring of the patient's
level of consciousness and physiological / cardiorespiratory status
by the radiology RN, with a total moderate sedation time of 10
minutes. A 17 gauge trocar needle was advanced under ultrasound
guidance into the liver to the margin of the lesion. Multiple
coaxial 49gauge core samples were then obtained through the guide
needle. Aspiration of approximately 5 mL purulent material was then
performed through the guide needle itself. The guide needle was
removed. Post procedure scans demonstrate no apparent complication.

COMPLICATIONS:
COMPLICATIONS
None immediate
FINDINGS: Complex cystic hepatic lesions were localized corresponding to CT
findings. Representative core biopsy samples were obtained. Given
the prolonged appearance through the guide needle, 5 mL of aspirate
sent for Gram stain and culture.
IMPRESSION: 1. Technically successful ultrasound guided core liver biopsy.
2. Technically successful aspiration of liver lesion for culture.

## 2022-04-22 IMAGING — MR MR LUMBAR SPINE WO/W CM
14 of 21 series · 31 of 48 positions shown · IV contrast (with contrast)
Comparison: CT 06/23/2021, MRI 02/18/2016.

CLINICAL DATA: staph aureus disseminated infection, tender
thoracolumbar spine

EXAM:
MRI THORACIC AND LUMBAR SPINE WITHOUT AND WITH CONTRAST
TECHNIQUE: Multiplanar and multiecho pulse sequences of the thoracic and lumbar
spine were obtained without and with intravenous contrast.
CONTRAST:  6mL GADAVIST GADOBUTROL 1 MMOL/ML IV SOLN

[Series 16: T1 · sagittal · 4.0mm · 1.72mm/px · 1 of 5 slices shown (1 of 7)]
[im 1/5]
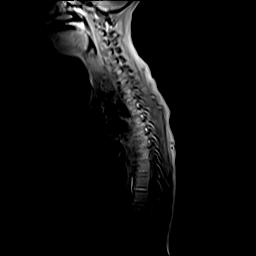

[Series 18: T1 · sagittal · 3.0mm · 1.00mm/px · 1 of 15 slices shown (2 of 7)]
[im 1/15]
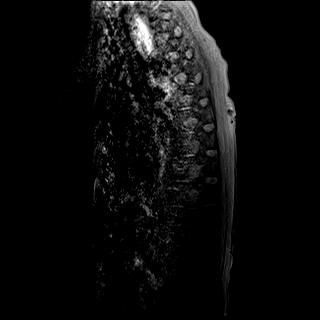

[Series 19: T2 · sagittal · 3.0mm · 0.83mm/px · 1 of 15 slices shown (1 of 4)]
[im 1/15]
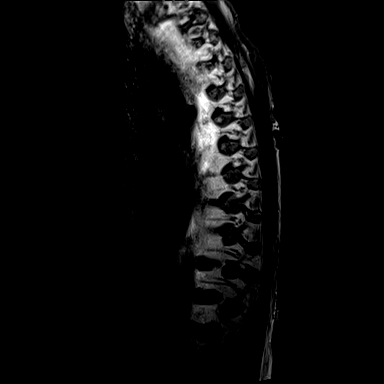

[Series 35: T1 · sagittal · 4.0mm · 1.72mm/px · 1 of 5 slices shown (3 of 7)]
[im 1/5]
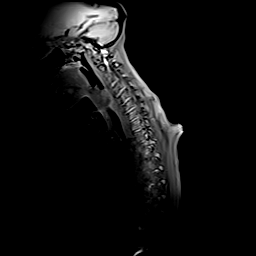

[Series 37: T1 · sagittal · 3.0mm · 1.00mm/px · 1 of 15 slices shown (4 of 7)]
[im 1/15]
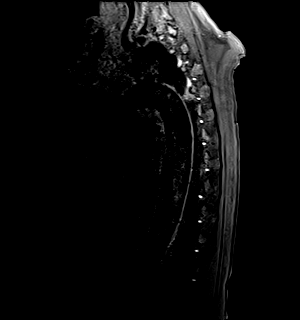

[Series 38: T2 · sagittal · 3.0mm · 0.83mm/px · 2 of 15 slices shown (2 of 4)]
[im 1/15]
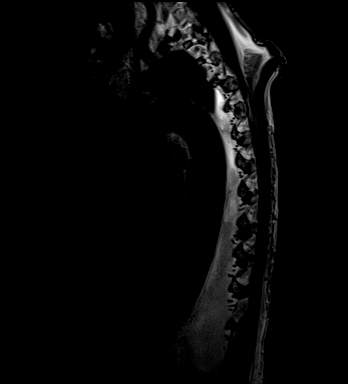
[im 15/15]
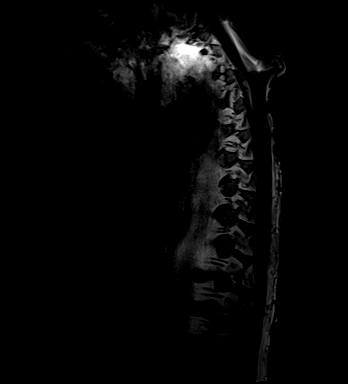

[Series 39: T2 · axial · 4.0mm · 0.78mm/px · z∈[-238,-1]mm · 4 of 36 slices shown (3 of 4)]
[im 1/36]
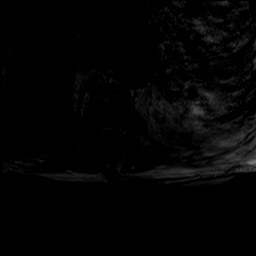
[im 12/36]
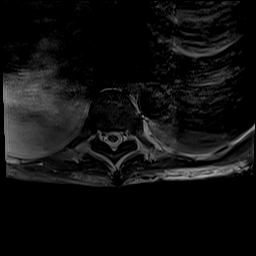
[im 24/36]
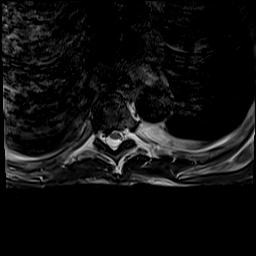
[im 36/36]
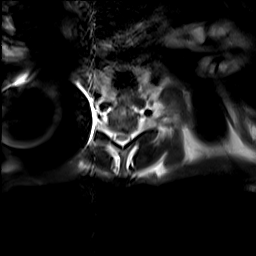

[Series 41: T1 · axial · 4.0mm · 0.39mm/px · z∈[-238,-1]mm · 4 of 36 slices shown (5 of 7)]
[im 1/36]
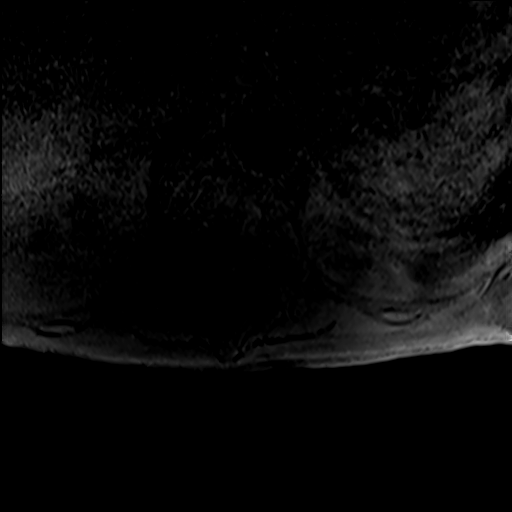
[im 12/36]
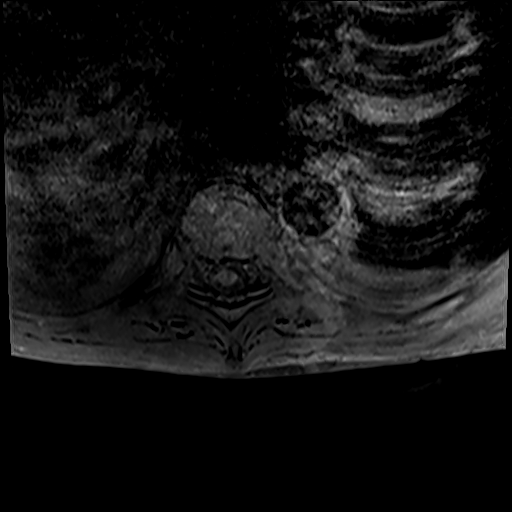
[im 24/36]
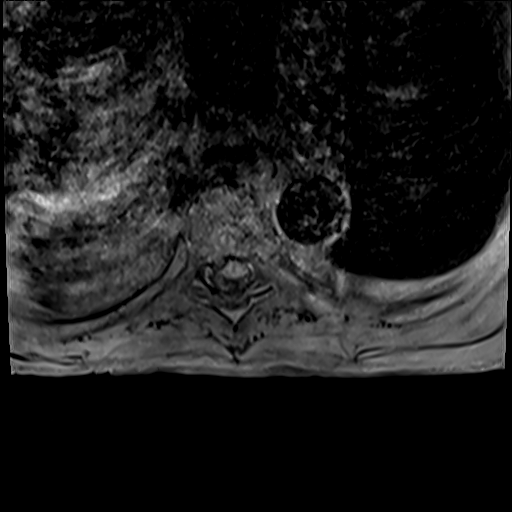
[im 36/36]
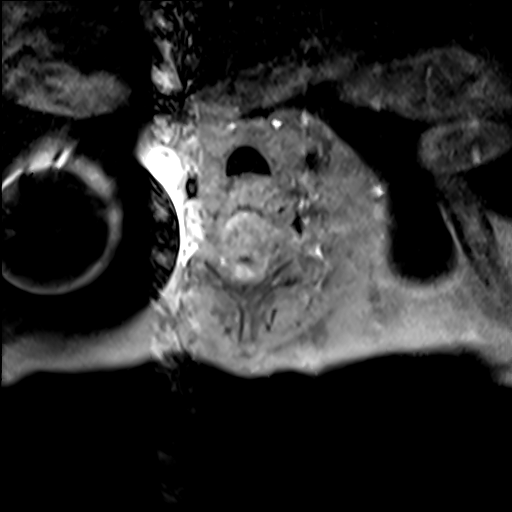

[Series 42: T1 · sagittal · 4.0mm · 0.81mm/px · 2 of 14 slices shown (6 of 7)]
[im 1/14]
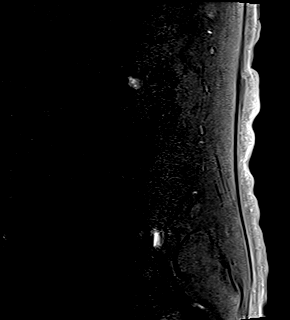
[im 14/14]
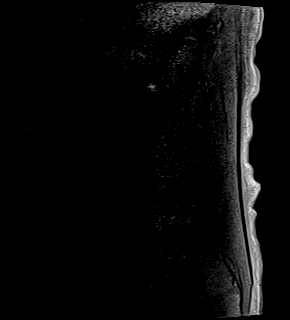

[Series 44: T2 · axial · 4.0mm · 0.62mm/px · z∈[-462,-292]mm · 4 of 35 slices shown (4 of 4)]
[im 1/35]
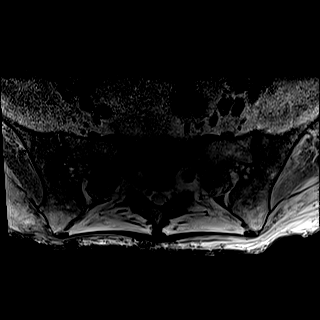
[im 12/35]
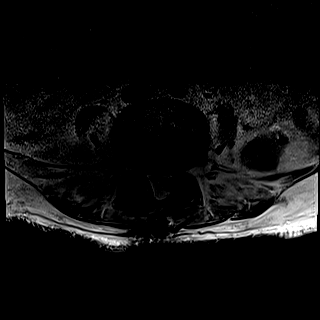
[im 23/35]
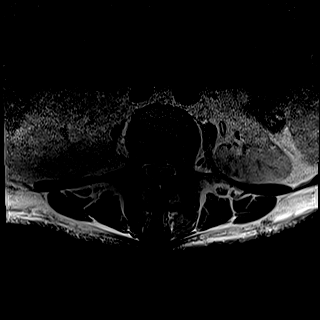
[im 35/35]
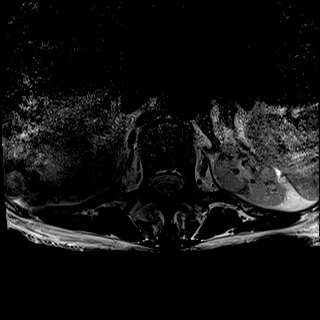

[Series 45: T1 · axial · 4.0mm · 0.39mm/px · z∈[-462,-292]mm · 4 of 35 slices shown (7 of 7)]
[im 1/35]
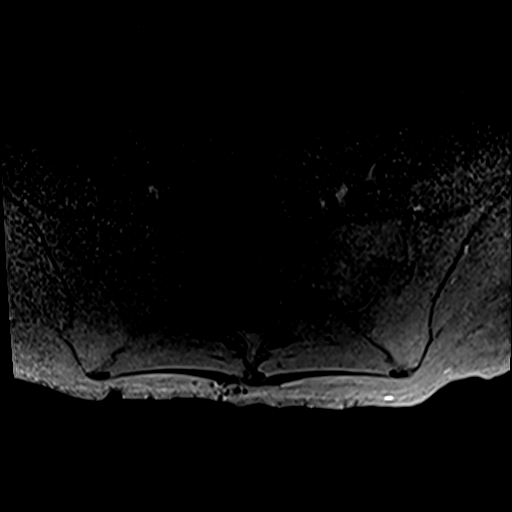
[im 12/35]
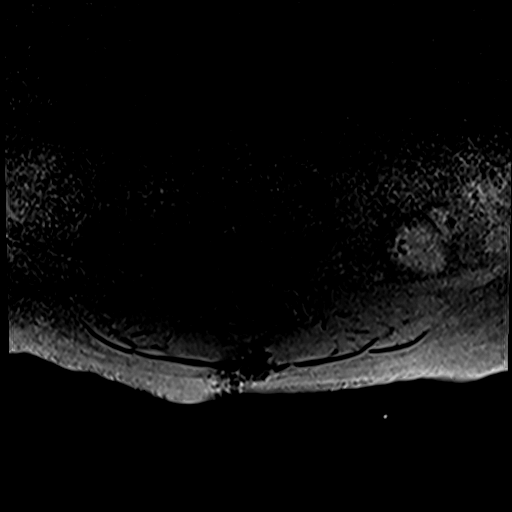
[im 23/35]
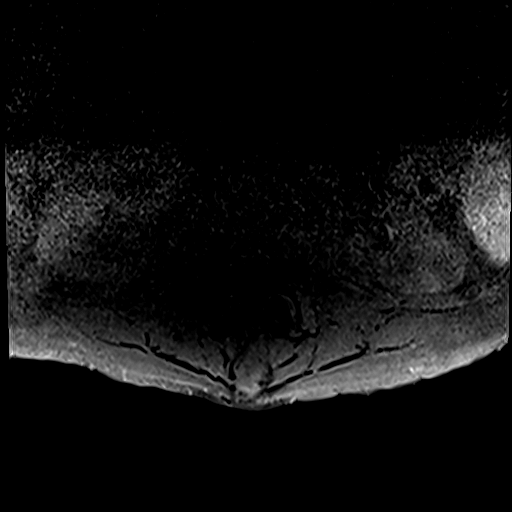
[im 35/35]
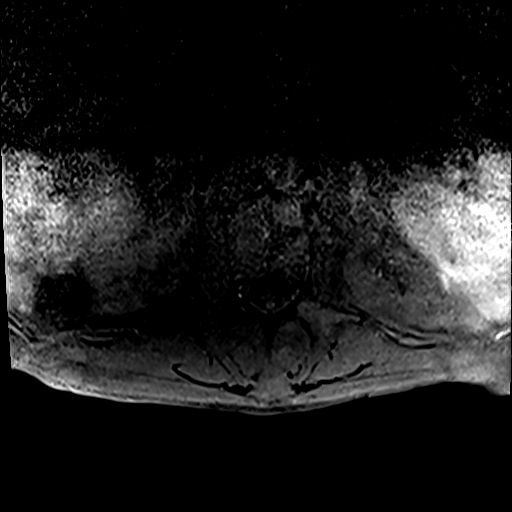

[Series 46: T2 post-contrast · sagittal · 4.0mm · 0.81mm/px · 2 of 14 slices shown]
[im 1/14]
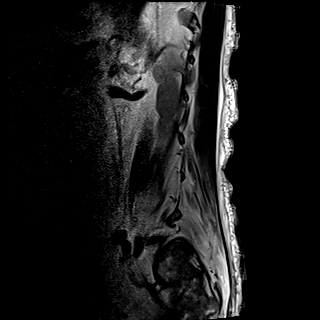
[im 14/14]
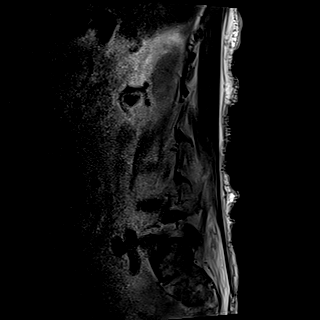

[Series 47: T1 fat-sat post-contrast · sagittal · 4.0mm · 0.81mm/px · 2 of 14 slices shown (1 of 2)]
[im 1/14]
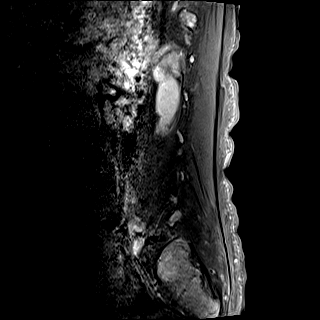
[im 14/14]
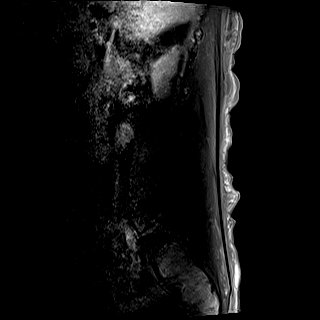

[Series 49: T1 fat-sat post-contrast · sagittal · 3.0mm · 1.00mm/px · 2 of 15 slices shown (2 of 2)]
[im 1/15]
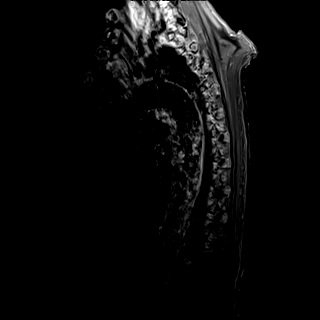
[im 15/15]
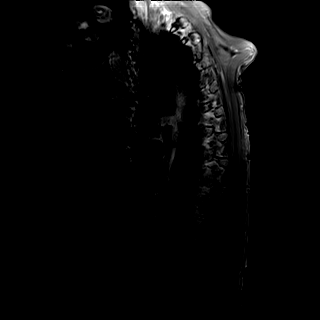

[31 of 48 positions shown; findings below may reference images not displayed]

FINDINGS: Motion degraded exam and overall poor image quality.

MRI THORACIC SPINE FINDINGS

Alignment:  Physiologic.

Vertebrae: No evidence of acute fracture, discitis, or aggressive
osseous lesion. Probable small benign hemangioma within the T9
vertebral body measuring 5 mm.

Cord: No abnormal cord signal.

Paraspinal and other soft tissues: Bilateral pleural effusions,
moderate on the right and small on the left. No enhancing paraspinal
collection.

Disc levels: There is no evidence of an enhancing epidural
collection. Multilevel small posterior disc protrusions from T1
through T7 resulting in varying degrees of minimal to mild ventral
canal encroachment. No neural foraminal stenosis. There is a
prominent left paracentral disc protrusion at T7-T8 resulting in
left subarticular stenosis and contact with the left ventral cord.
No neural foraminal stenosis. There is also a prominent left
paracentral disc protrusion at T9-T10 resulting in left subarticular
stenosis and contact with the left ventral cord. No neural foraminal
stenosis.

MRI LUMBAR SPINE FINDINGS

Segmentation:  Standard.

Alignment:  Straightening of the normal lumbar lordosis.

Vertebrae: No evidence of acute fracture, discitis, or aggressive
osseous lesion.

Conus medullaris and cauda equina: Conus extends to the T12 level.
Conus and cauda equina appear normal.

Paraspinal and other soft tissues: Atrophic left kidney.
Abdominopelvic ascites noted. No abnormal paraspinal enhancement.

Disc levels:

T12-L1: No significant spinal canal or neural foraminal narrowing.

L1-L2: No significant spinal canal or neural foraminal narrowing.

L2-L3: No significant spinal canal or neural foraminal narrowing.

L3-L4: Severe degenerative disc height loss with endplate sclerosis
and edema. Prior laminotomy. There is broad-based disc bulging,
ligament flavum hypertrophy and bilateral facet arthropathy
resulting in moderate spinal canal stenosis, moderate left and mild
right neural foraminal stenosis.

L4-L5: Moderate disc height loss with broad-based disc bulging,
ligament flavum hypertrophy mild facet arthropathy. Prior posterior
compression. Patent spinal canal. Mild bilateral subarticular
stenosis bilaterally.

L5-S1: Moderate disc height loss with endplate spurring and
broad-based disc bulging. Prior laminotomy. Patent spinal canal.
Mild subarticular stenosis bilaterally. Mild bilateral neural
foraminal stenosis.
IMPRESSION: Motion degraded exam and overall poor image quality.

No evidence of discitis-osteomyelitis in the thoracolumbar spine.

Multilevel degenerative changes of the thoracic spine, worst at
T7-T8 and T9-T10 with left paracentral disc protrusions resulting in
subarticular stenosis at these levels and contact with the left
ventral cord. Findings are similar to prior MRI in January 2016.

Multilevel degenerative changes of the lumbar spine, most severe at
L3-L4 where there is moderate spinal canal stenosis, moderate left
and mild right neural foraminal stenosis.

Prior decompression with patent spinal canal at L4-L5 and L5-S1.
Mild bilateral subarticular stenosis at these levels. Mild bilateral
neural foraminal stenosis at L5-S1.

Lumbar spine findings are similar to prior CT in [DATE].

## 2022-04-22 IMAGING — MR MR THORACIC SPINE WO/W CM
14 of 21 series · 31 of 48 positions shown · IV contrast (gadavist)
Comparison: CT 06/23/2021, MRI 02/18/2016.

CLINICAL DATA: staph aureus disseminated infection, tender
thoracolumbar spine

EXAM:
MRI THORACIC AND LUMBAR SPINE WITHOUT AND WITH CONTRAST
TECHNIQUE: Multiplanar and multiecho pulse sequences of the thoracic and lumbar
spine were obtained without and with intravenous contrast.
CONTRAST:  6mL GADAVIST GADOBUTROL 1 MMOL/ML IV SOLN

[Series 16: T1 · sagittal · 4.0mm · 1.72mm/px · 1 of 5 slices shown (1 of 7)]
[im 1/5]
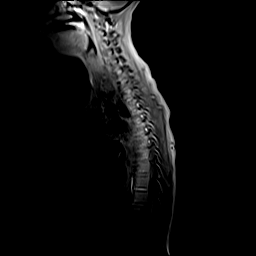

[Series 18: T1 · sagittal · 3.0mm · 1.00mm/px · 1 of 15 slices shown (2 of 7)]
[im 1/15]
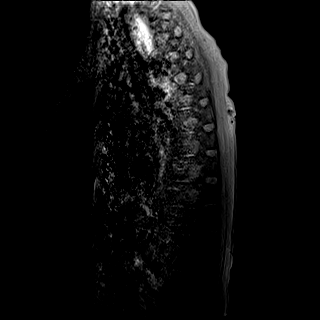

[Series 19: T2 · sagittal · 3.0mm · 0.83mm/px · 1 of 15 slices shown (1 of 4)]
[im 1/15]
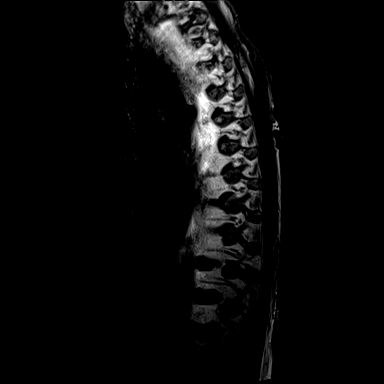

[Series 35: T1 · sagittal · 4.0mm · 1.72mm/px · 1 of 5 slices shown (3 of 7)]
[im 1/5]
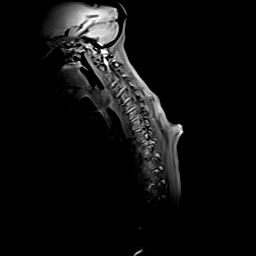

[Series 37: T1 · sagittal · 3.0mm · 1.00mm/px · 1 of 15 slices shown (4 of 7)]
[im 1/15]
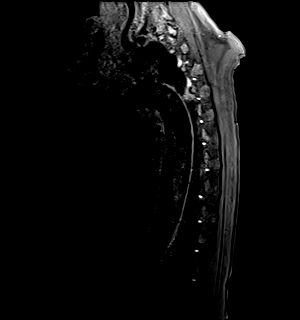

[Series 38: T2 · sagittal · 3.0mm · 0.83mm/px · 2 of 15 slices shown (2 of 4)]
[im 1/15]
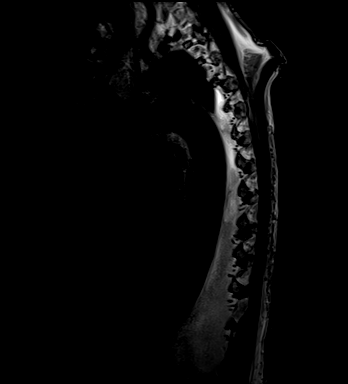
[im 15/15]
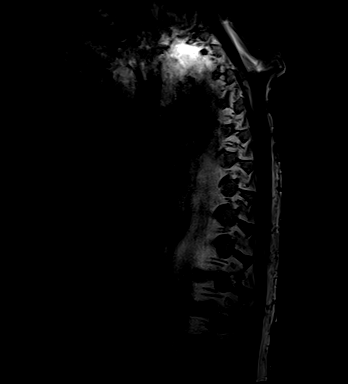

[Series 39: T2 · axial · 4.0mm · 0.78mm/px · z∈[-238,-1]mm · 4 of 36 slices shown (3 of 4)]
[im 1/36]
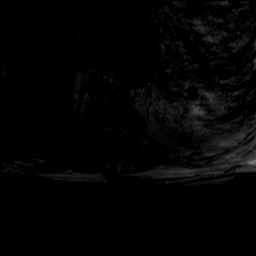
[im 12/36]
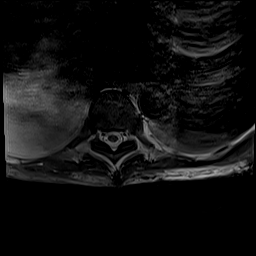
[im 24/36]
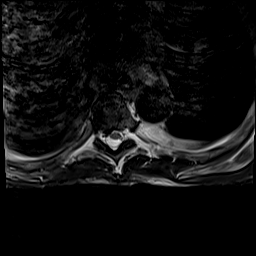
[im 36/36]
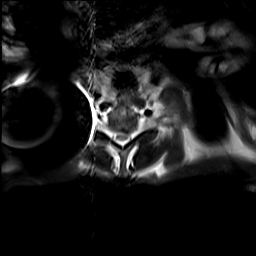

[Series 41: T1 · axial · 4.0mm · 0.39mm/px · z∈[-238,-1]mm · 4 of 36 slices shown (5 of 7)]
[im 1/36]
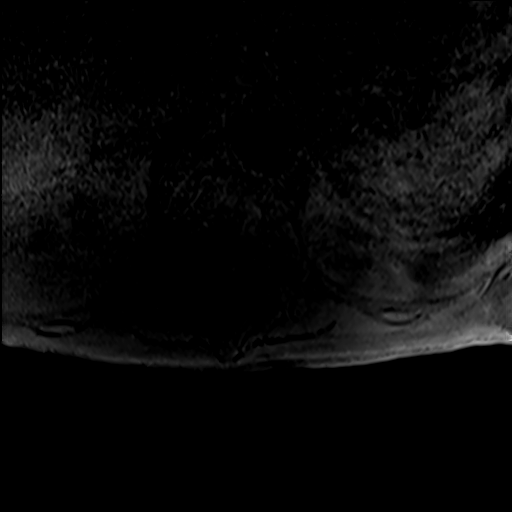
[im 12/36]
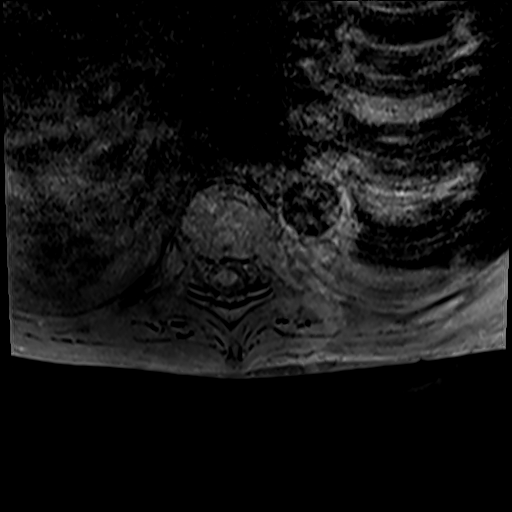
[im 24/36]
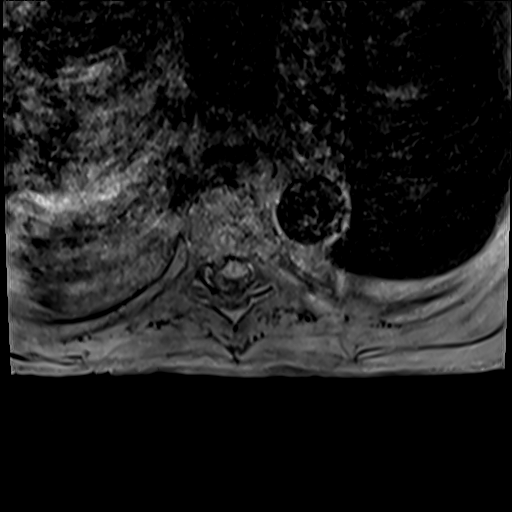
[im 36/36]
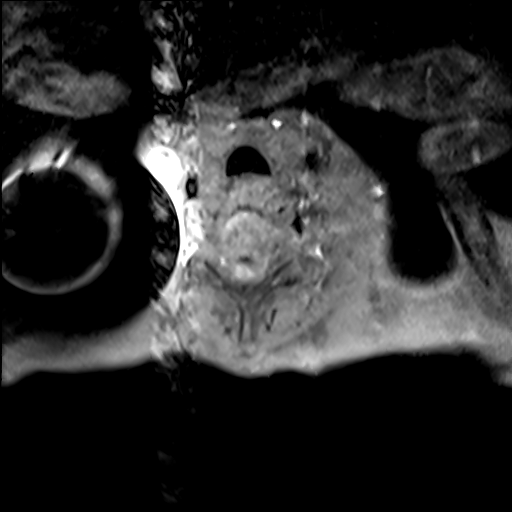

[Series 42: T1 · sagittal · 4.0mm · 0.81mm/px · 2 of 14 slices shown (6 of 7)]
[im 1/14]
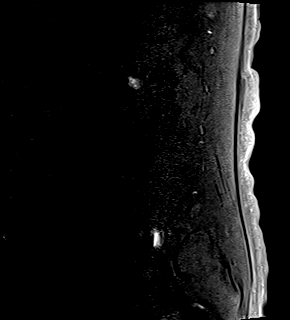
[im 14/14]
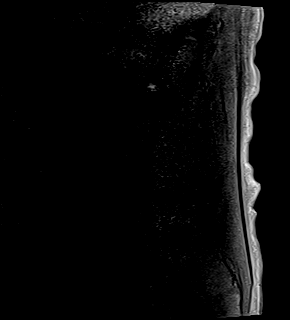

[Series 44: T2 · axial · 4.0mm · 0.62mm/px · z∈[-462,-292]mm · 4 of 35 slices shown (4 of 4)]
[im 1/35]
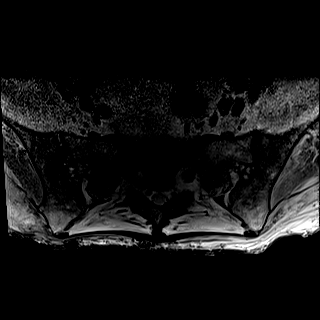
[im 12/35]
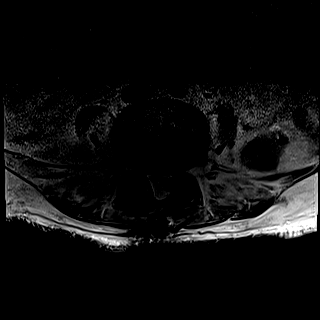
[im 23/35]
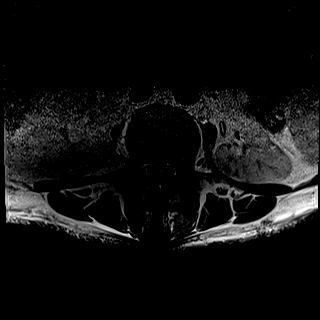
[im 35/35]
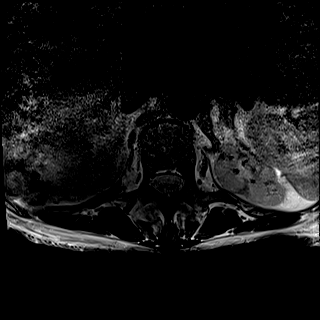

[Series 45: T1 · axial · 4.0mm · 0.39mm/px · z∈[-462,-292]mm · 4 of 35 slices shown (7 of 7)]
[im 1/35]
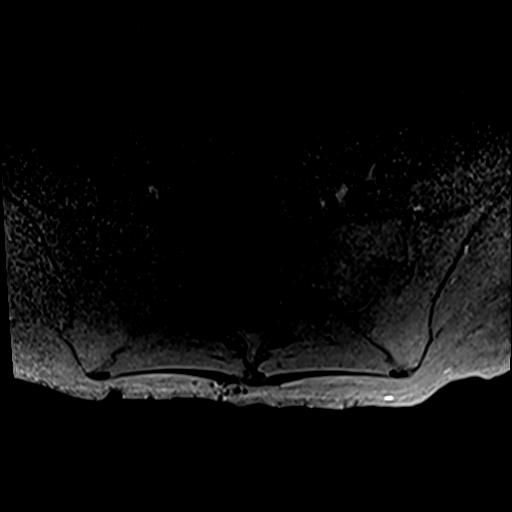
[im 12/35]
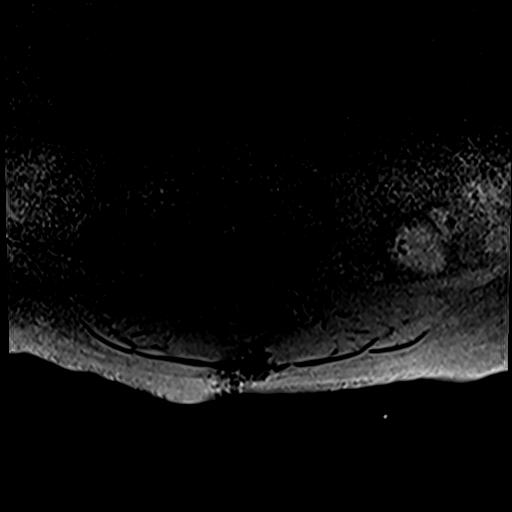
[im 23/35]
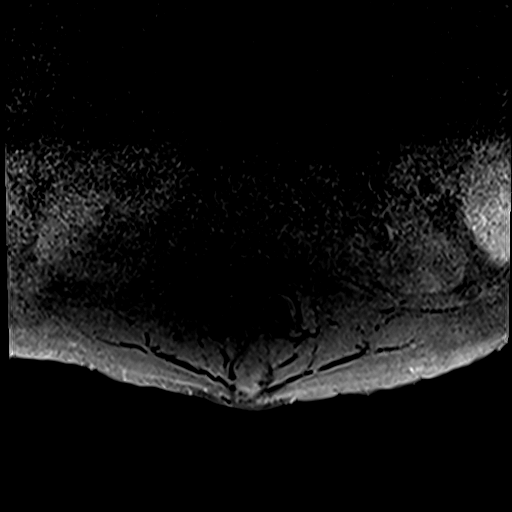
[im 35/35]
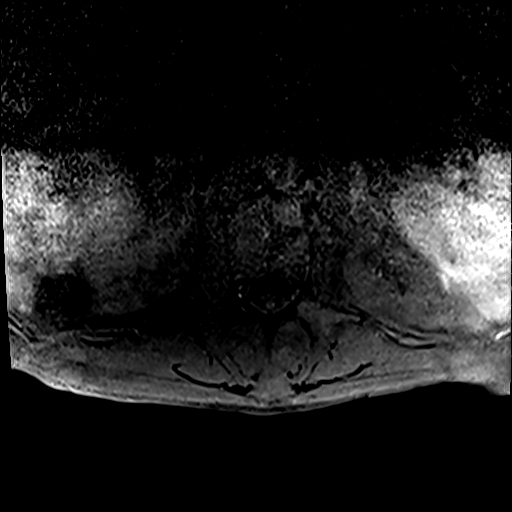

[Series 46: T2 post-contrast · sagittal · 4.0mm · 0.81mm/px · 2 of 14 slices shown]
[im 1/14]
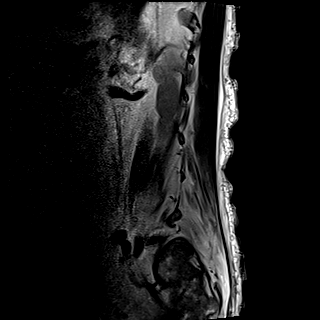
[im 14/14]
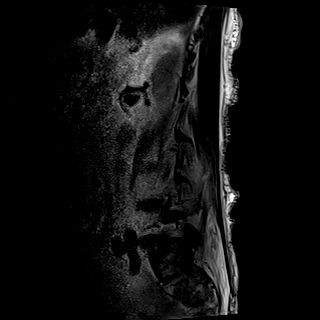

[Series 47: T1 fat-sat post-contrast · sagittal · 4.0mm · 0.81mm/px · 2 of 14 slices shown (1 of 2)]
[im 1/14]
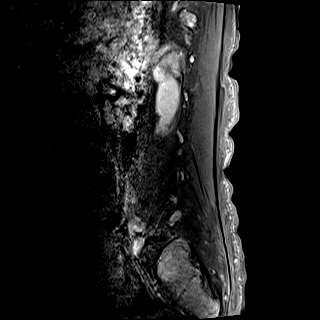
[im 14/14]
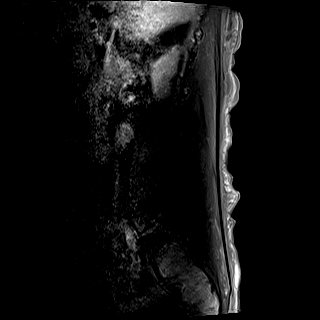

[Series 49: T1 fat-sat post-contrast · sagittal · 3.0mm · 1.00mm/px · 2 of 15 slices shown (2 of 2)]
[im 1/15]
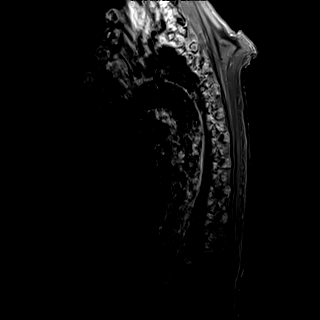
[im 15/15]
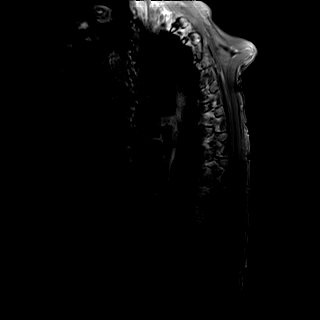

[31 of 48 positions shown; findings below may reference images not displayed]

FINDINGS: Motion degraded exam and overall poor image quality.

MRI THORACIC SPINE FINDINGS

Alignment:  Physiologic.

Vertebrae: No evidence of acute fracture, discitis, or aggressive
osseous lesion. Probable small benign hemangioma within the T9
vertebral body measuring 5 mm.

Cord: No abnormal cord signal.

Paraspinal and other soft tissues: Bilateral pleural effusions,
moderate on the right and small on the left. No enhancing paraspinal
collection.

Disc levels: There is no evidence of an enhancing epidural
collection. Multilevel small posterior disc protrusions from T1
through T7 resulting in varying degrees of minimal to mild ventral
canal encroachment. No neural foraminal stenosis. There is a
prominent left paracentral disc protrusion at T7-T8 resulting in
left subarticular stenosis and contact with the left ventral cord.
No neural foraminal stenosis. There is also a prominent left
paracentral disc protrusion at T9-T10 resulting in left subarticular
stenosis and contact with the left ventral cord. No neural foraminal
stenosis.

MRI LUMBAR SPINE FINDINGS

Segmentation:  Standard.

Alignment:  Straightening of the normal lumbar lordosis.

Vertebrae: No evidence of acute fracture, discitis, or aggressive
osseous lesion.

Conus medullaris and cauda equina: Conus extends to the T12 level.
Conus and cauda equina appear normal.

Paraspinal and other soft tissues: Atrophic left kidney.
Abdominopelvic ascites noted. No abnormal paraspinal enhancement.

Disc levels:

T12-L1: No significant spinal canal or neural foraminal narrowing.

L1-L2: No significant spinal canal or neural foraminal narrowing.

L2-L3: No significant spinal canal or neural foraminal narrowing.

L3-L4: Severe degenerative disc height loss with endplate sclerosis
and edema. Prior laminotomy. There is broad-based disc bulging,
ligament flavum hypertrophy and bilateral facet arthropathy
resulting in moderate spinal canal stenosis, moderate left and mild
right neural foraminal stenosis.

L4-L5: Moderate disc height loss with broad-based disc bulging,
ligament flavum hypertrophy mild facet arthropathy. Prior posterior
compression. Patent spinal canal. Mild bilateral subarticular
stenosis bilaterally.

L5-S1: Moderate disc height loss with endplate spurring and
broad-based disc bulging. Prior laminotomy. Patent spinal canal.
Mild subarticular stenosis bilaterally. Mild bilateral neural
foraminal stenosis.
IMPRESSION: Motion degraded exam and overall poor image quality.

No evidence of discitis-osteomyelitis in the thoracolumbar spine.

Multilevel degenerative changes of the thoracic spine, worst at
T7-T8 and T9-T10 with left paracentral disc protrusions resulting in
subarticular stenosis at these levels and contact with the left
ventral cord. Findings are similar to prior MRI in January 2016.

Multilevel degenerative changes of the lumbar spine, most severe at
L3-L4 where there is moderate spinal canal stenosis, moderate left
and mild right neural foraminal stenosis.

Prior decompression with patent spinal canal at L4-L5 and L5-S1.
Mild bilateral subarticular stenosis at these levels. Mild bilateral
neural foraminal stenosis at L5-S1.

Lumbar spine findings are similar to prior CT in [DATE].

## 2022-04-22 IMAGING — US US ABDOMEN LIMITED
1 series · 4 of 4 positions shown · non-contrast
Comparison: None.

CLINICAL DATA: 45-year-old female referred for possible
paracentesis

EXAM:
LIMITED ABDOMEN ULTRASOUND FOR ASCITES
TECHNIQUE: Limited ultrasound survey for ascites was performed in all four
abdominal quadrants.

[Series 1: us paracentesis mc & wl · 4 of 4 slices shown]
[im 1/4]
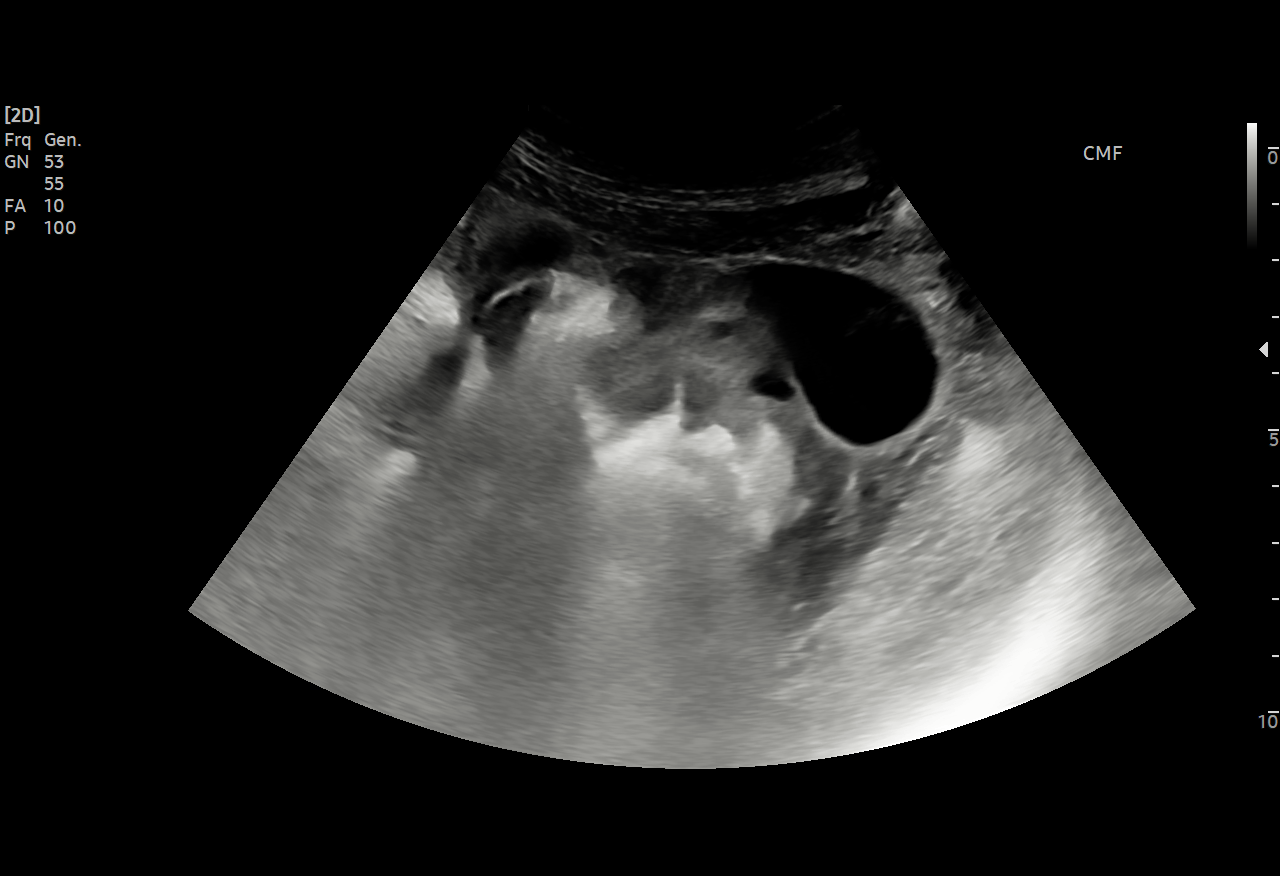
[im 2/4]
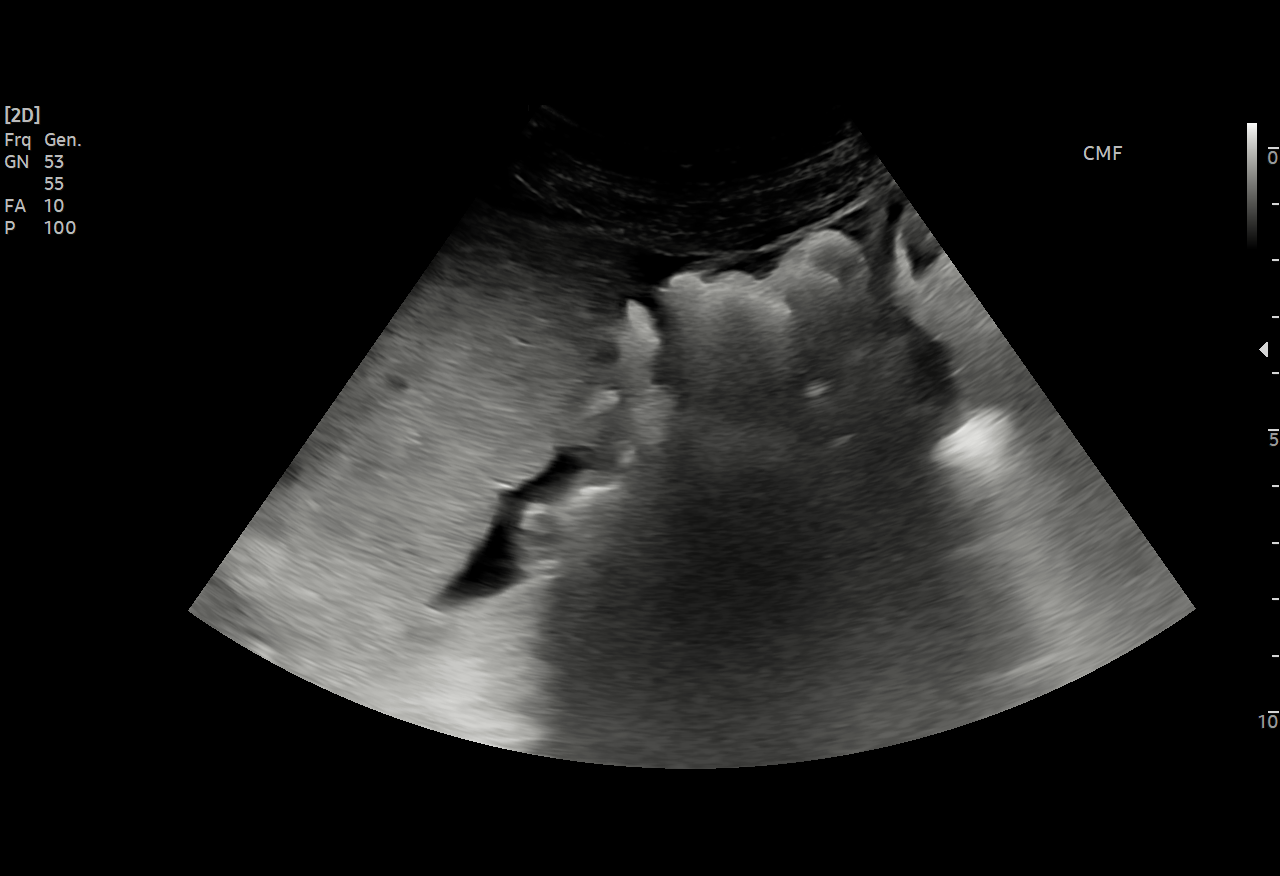
[im 3/4]
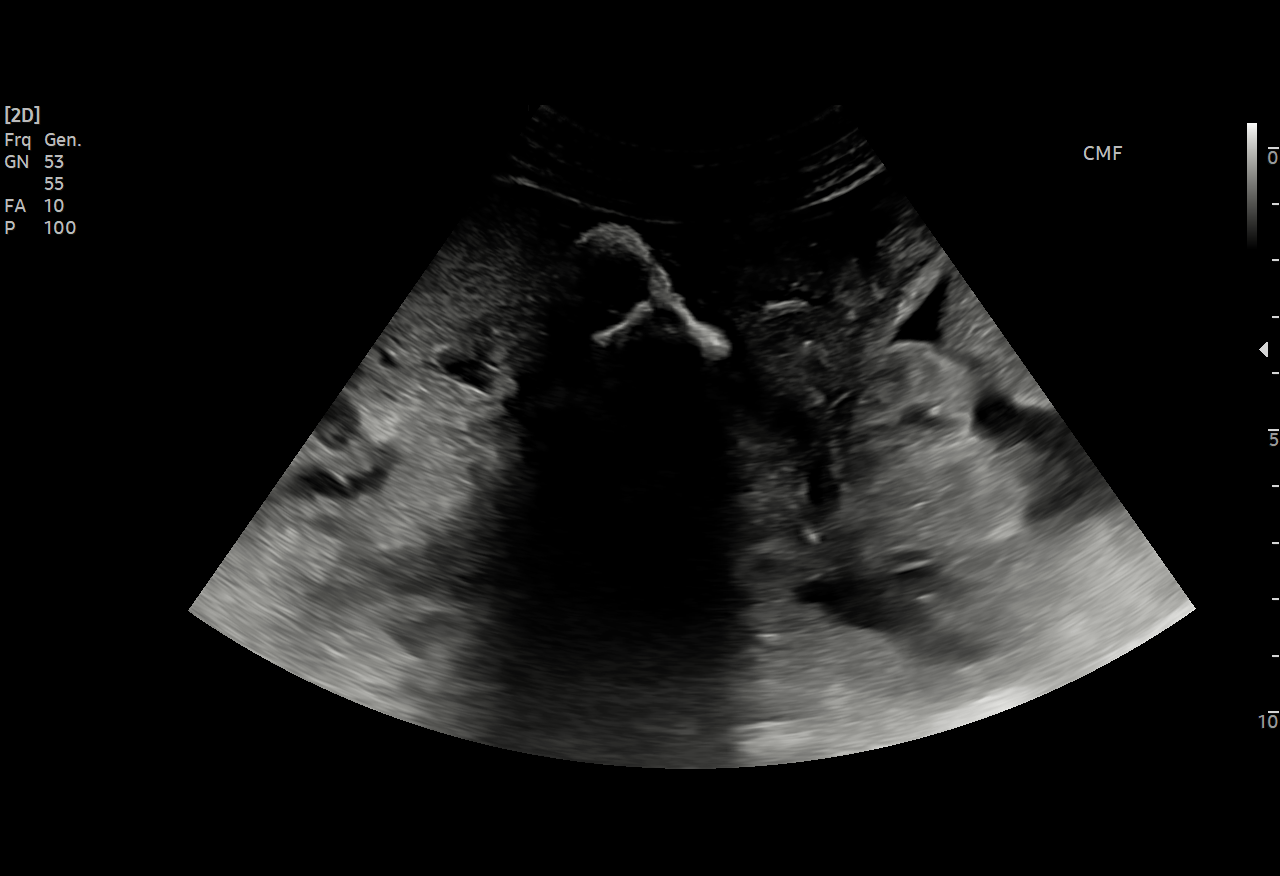
[im 4/4]
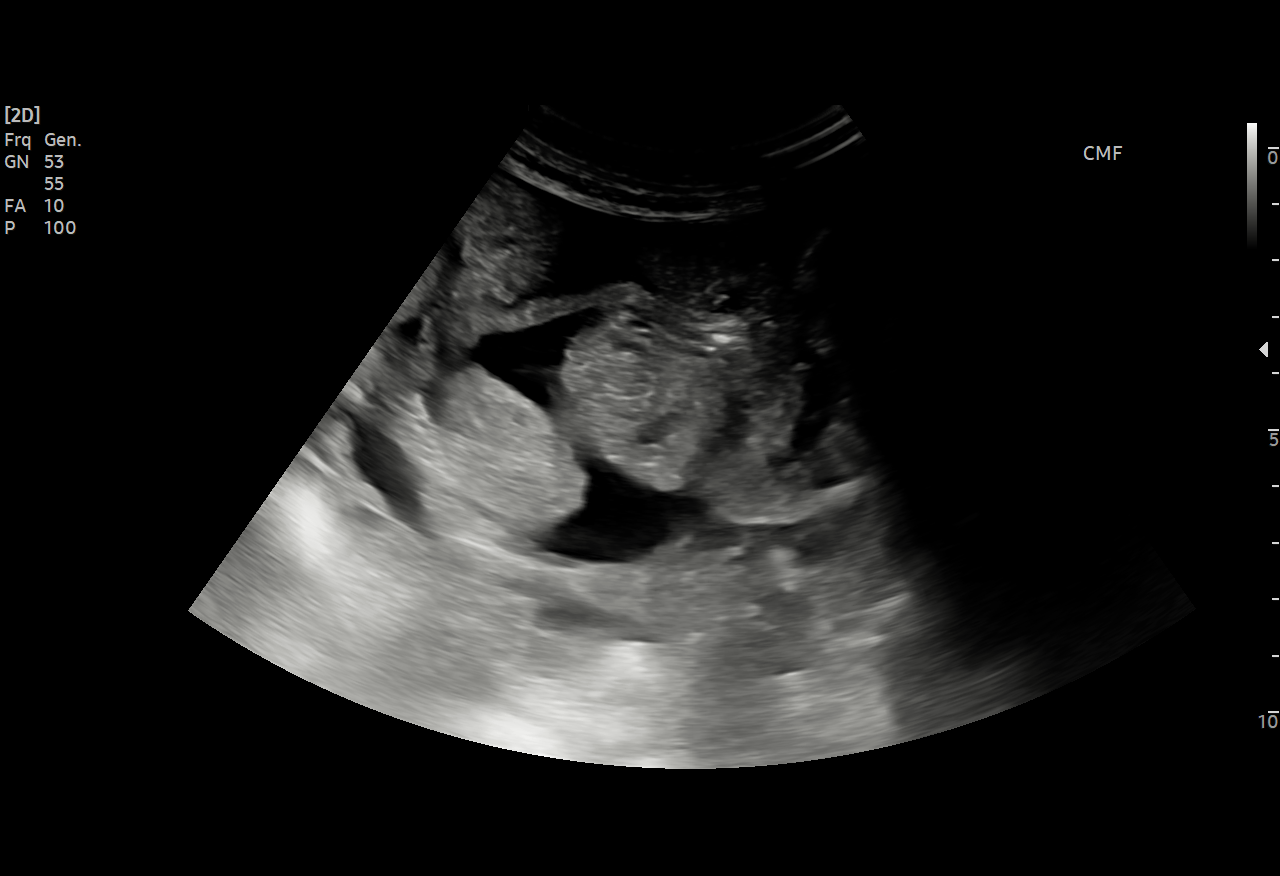

[4 of 4 positions shown; findings below may reference images not displayed]

FINDINGS: Limited ultrasound demonstrates scant ascites with no safe window
for access
IMPRESSION: Scant ascites on the abdominal ultrasound.

## 2022-04-24 IMAGING — DX DG CHEST 1V PORT
1 series · 1 of 1 positions shown · non-contrast
Comparison: Chest x-ray 08/31/2021

CLINICAL DATA: Shortness of breath

EXAM:
PORTABLE CHEST 1 VIEW

[chest ap]
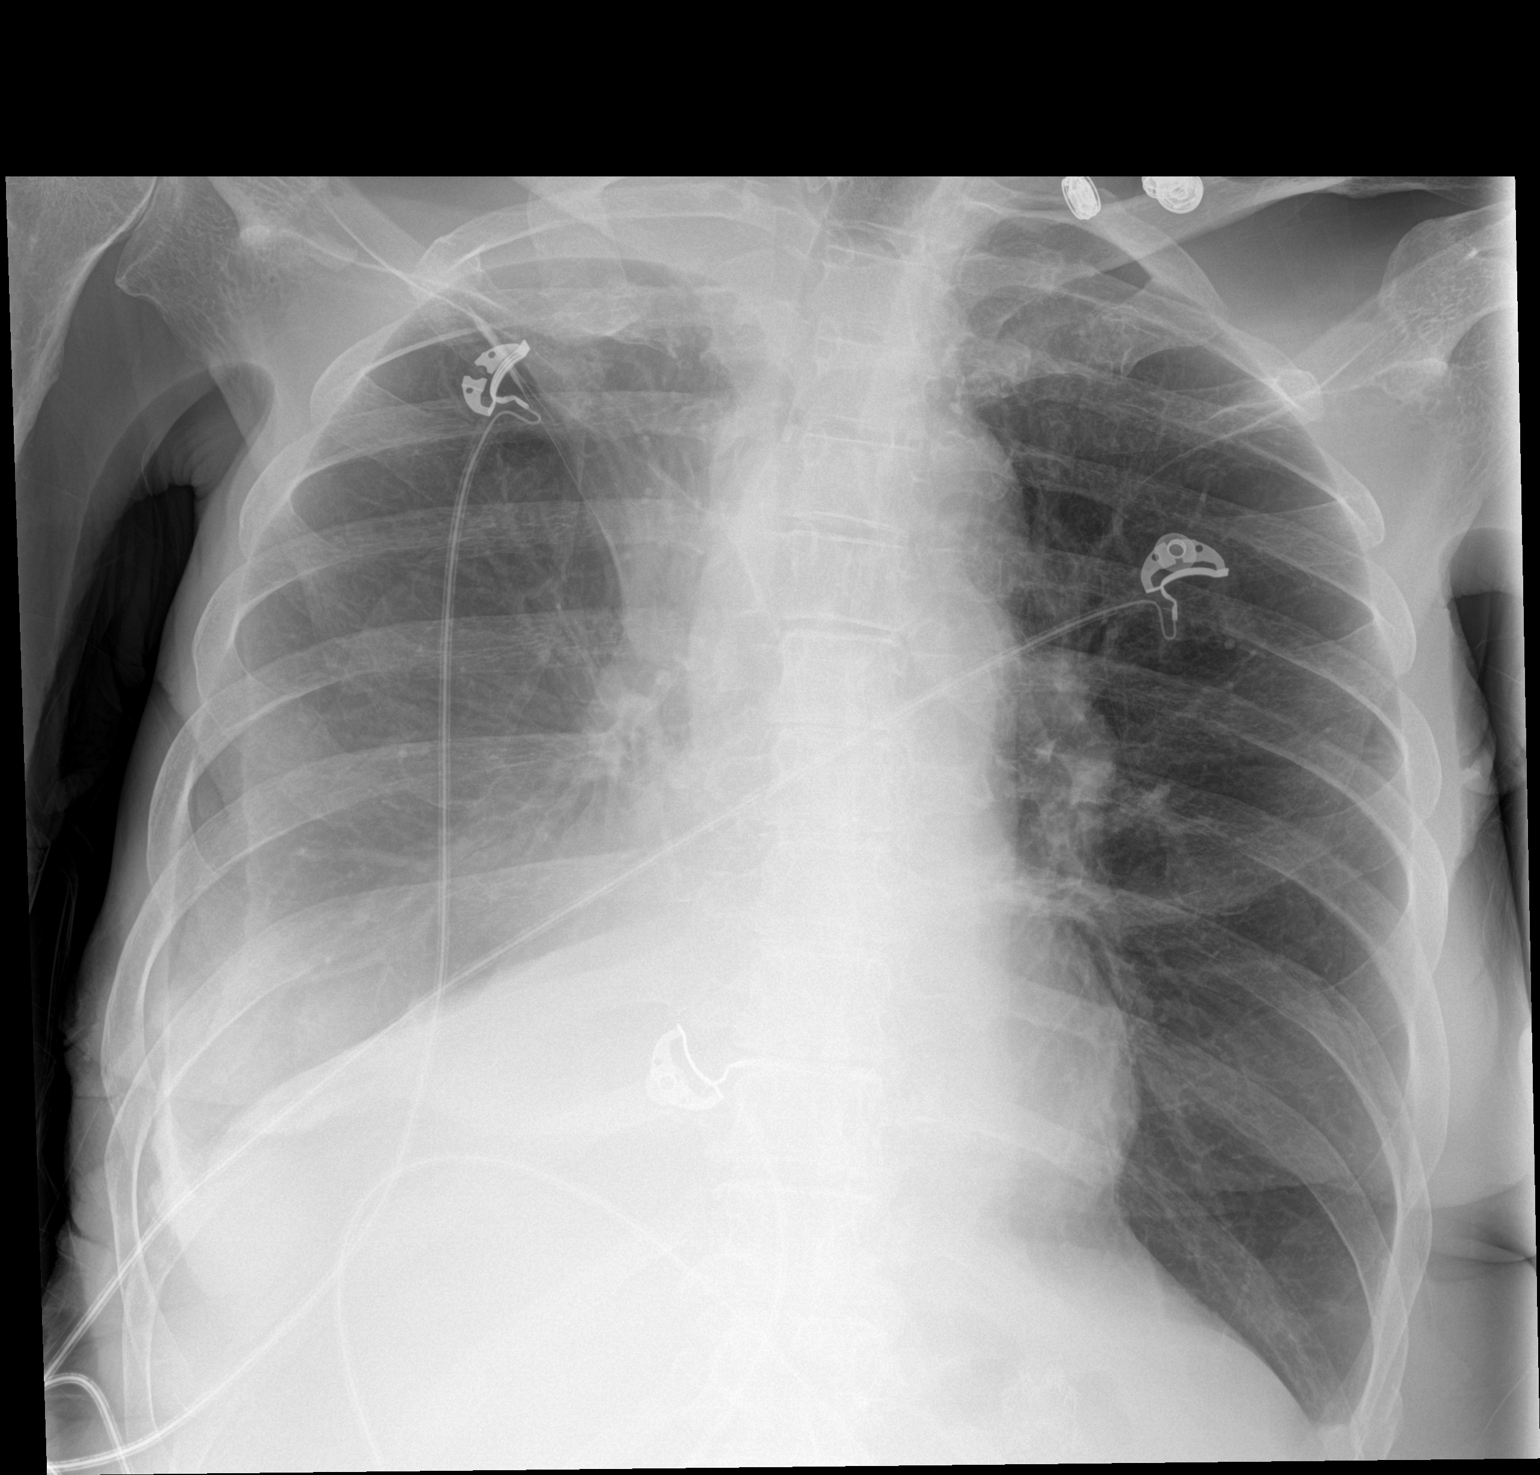

[1 of 1 positions shown; findings below may reference images not displayed]

FINDINGS: Silhouetting off of the right heart border. The heart and
mediastinal contours are within normal limits.

Right upper lobe haziness suggestive atelectasis/collapse.
Suggestion of right middle lobe consolidation. No pulmonary edema.
Nonspecific blunting of bilateral costophrenic angles with at least
small volume right and trace left pleural effusion. No pneumothorax.

No acute osseous abnormality.
IMPRESSION: 1. Recommend repeat PA and lateral view of chest for further
evaluation.
2. Right upper lobe haziness suggestive atelectasis/collapse.
3. Suggestion of right middle lobe consolidation.
4. Nonspecific blunting of bilateral costophrenic angles with at
least small volume right and trace left pleural effusion.

## 2022-04-25 IMAGING — MR MR [PERSON_NAME]*[PERSON_NAME]* WO/W CM
8 series · 40 of 40 positions shown · IV contrast (gadavist)
Comparison: None available

CLINICAL DATA: Swelling of left hand.  Best images obtained.

EXAM:
MRI OF THE LEFT HAND WITHOUT AND WITH CONTRAST
TECHNIQUE: Multiplanar, multisequence MR imaging of the left hand was performed
before and after the administration of intravenous contrast. Best
images obtained. Patient was placed in right lateral decubitus
position for imaging. Patient unable to tolerate any other position.
The technologist had to repeat scout images for the postcontrast
sequences due to patient movement.
CONTRAST:  6mL GADAVIST GADOBUTROL 1 MMOL/ML IV SOLN

[Series 4: T2 fat-sat · axial · left · 4.0mm · 0.47mm/px · z∈[-156,+4]mm · 7 of 40 slices shown]
[im 1/40]
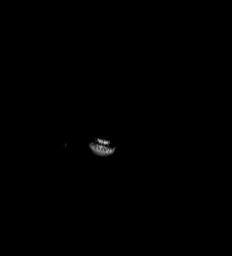
[im 7/40]
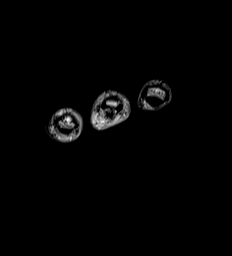
[im 14/40]
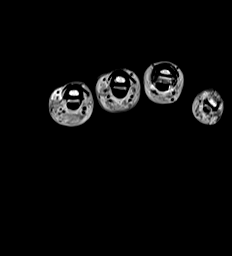
[im 20/40]
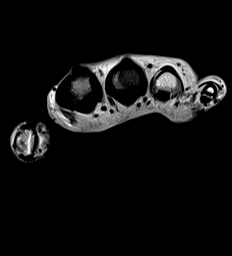
[im 27/40]
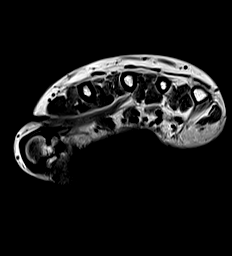
[im 33/40]
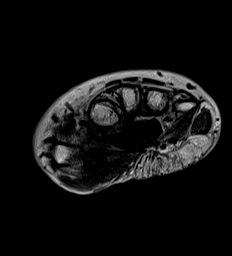
[im 40/40]
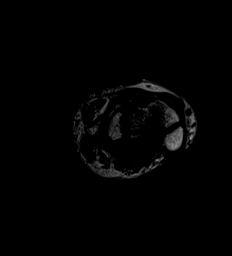

[Series 5: T1 · axial · left · 4.0mm · 0.47mm/px · z∈[-156,+4]mm · 7 of 40 slices shown (1 of 2)]
[im 1/40]
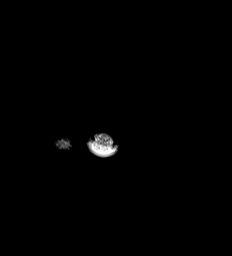
[im 7/40]
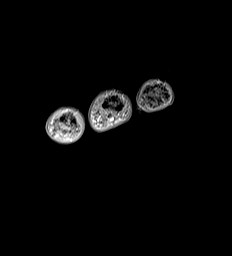
[im 14/40]
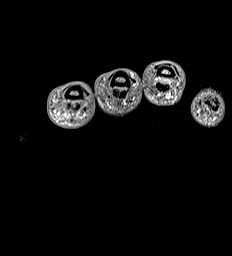
[im 20/40]
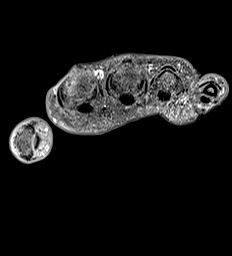
[im 27/40]
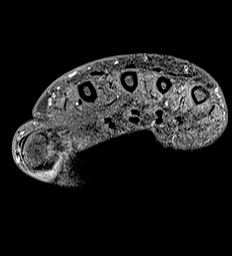
[im 33/40]
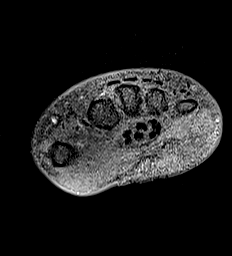
[im 40/40]
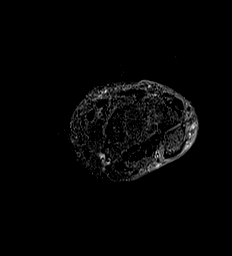

[Series 6: PD fat-sat · oblique · left · 3.0mm · 0.39mm/px · 5 of 32 slices shown]
[im 1/32]
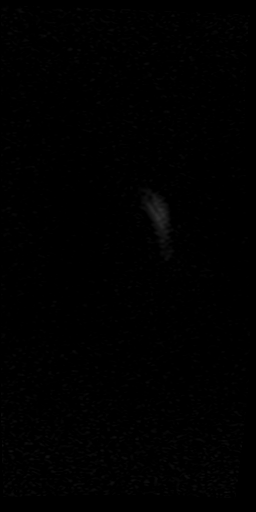
[im 8/32]
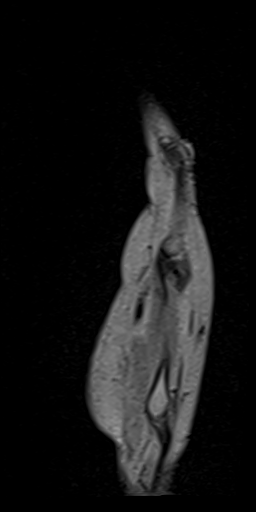
[im 16/32]
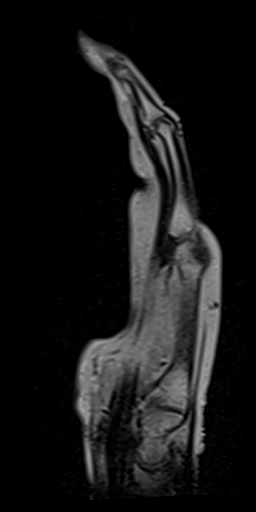
[im 24/32]
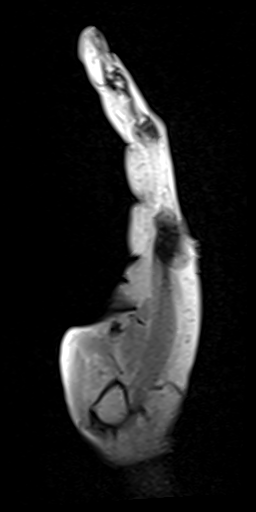
[im 32/32]
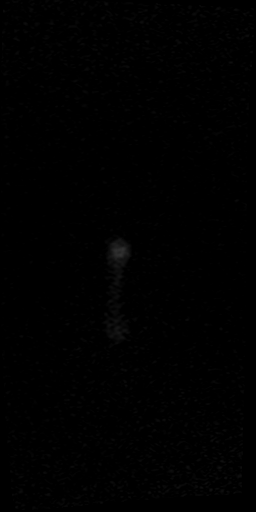

[Series 7: T1 · oblique · left · 3.0mm · 0.78mm/px · 3 of 21 slices shown (2 of 2)]
[im 1/21]
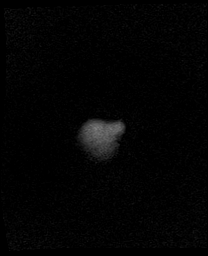
[im 11/21]
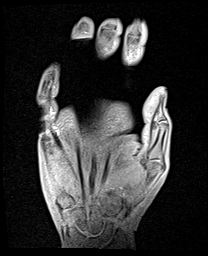
[im 21/21]
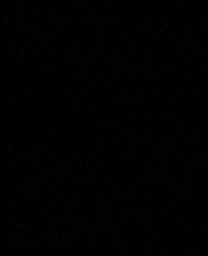

[Series 8: STIR · oblique · left · 3.0mm · 0.78mm/px · 3 of 20 slices shown]
[im 1/20]
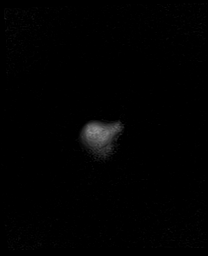
[im 10/20]
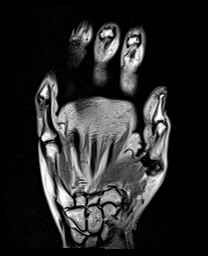
[im 20/20]
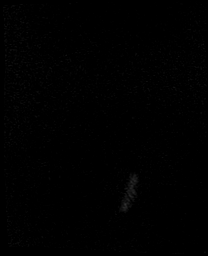

[Series 9: T1 fat-sat · axial · non-contrast · left · 4.0mm · 0.47mm/px · z∈[-156,+4]mm · 6 of 40 slices shown]
[im 1/40]
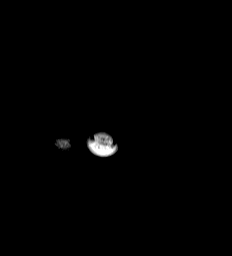
[im 8/40]
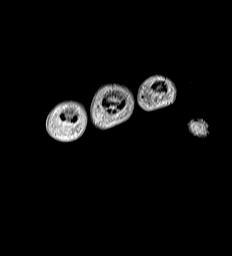
[im 16/40]
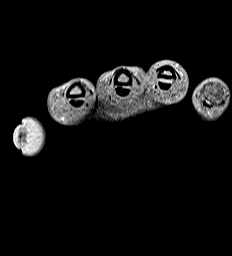
[im 24/40]
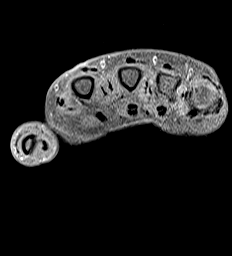
[im 32/40]
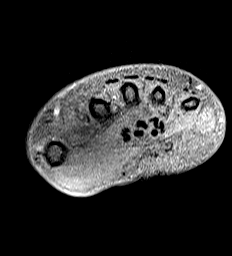
[im 40/40]
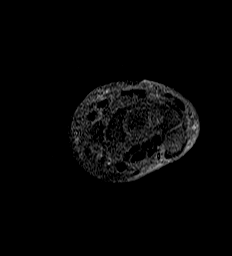

[Series 11: T1 fat-sat post-contrast · axial · left · 4.0mm · 0.47mm/px · z∈[-166,-4]mm · 6 of 40 slices shown (1 of 2)]
[im 1/40]
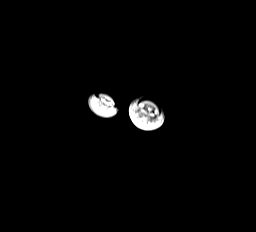
[im 8/40]
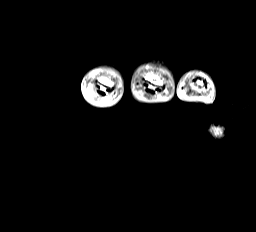
[im 16/40]
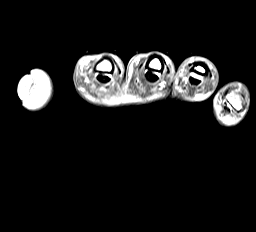
[im 24/40]
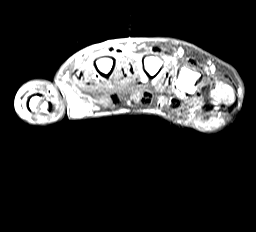
[im 32/40]
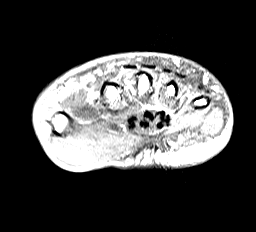
[im 40/40]
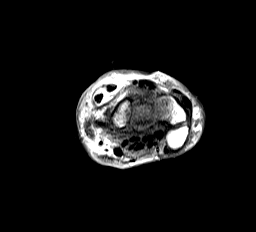

[Series 12: T1 fat-sat post-contrast · oblique · left · 3.0mm · 0.78mm/px · 3 of 20 slices shown (2 of 2)]
[im 1/20]
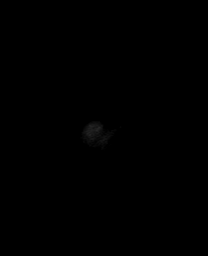
[im 10/20]
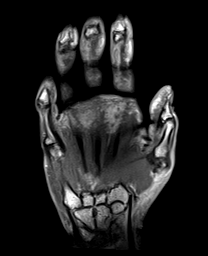
[im 20/20]
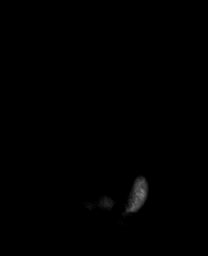

[40 of 40 positions shown; findings below may reference images not displayed]

FINDINGS: The technologist reports this study was limited due to patient
having to be positioned in a lateral decubitus position. There is
minimal fat saturation on the coronal T1 fat sat postcontrast,
coronal STIR sagittal proton density fat saturation trauma and axial
T1 fat saturation postcontrast sequences. Additionally, there is
unexpected fat saturation seen on the axial T1 and coronal T1
weighted sequences.

Bones/Joint/Cartilage

Within the limitations described above, no acute fracture is seen.
Moderate to severe thumb carpometacarpal joint space narrowing and
peripheral osteophytosis. Likely moderate triscaphe joint space
narrowing.

Mild fifth finger PIP medial apex angulation with likely mild joint
space narrowing and peripheral osteophytosis degenerative changes.

Ligaments

The medial and ulnar collateral ligaments of the visualized fingers
appear grossly intact.

Muscles and Tendons

Within the limitations of decreased signal to noise, the dorsal
extensor tendon compartments and more distal dorsal finger extensor
tendons appear intact. The flexor tendons and flexor retinaculum
appear intact. No definite tenosynovitis.

Soft tissues

No fluid collection is seen.
IMPRESSION: :
IMPRESSION: 1. Limited study due to patient condition and positioning,
inhomogeneous fat saturation, and nonconventional fat saturation of
some precontrast sequences and non-fat saturation of some
post-contrast sequences.
2. Moderate to severe thumb carpometacarpal and moderate triscaphe
osteoarthritis.
3. No definite fluid collection or tenosynovitis is seen.

## 2022-04-25 IMAGING — CR DG CHEST 2V
3 series · 3 of 3 positions shown · non-contrast
Comparison: 09/11/2021

CLINICAL DATA: Shortness of breath.

EXAM:
CHEST - 2 VIEW

[w chest lat (1 of 2)]
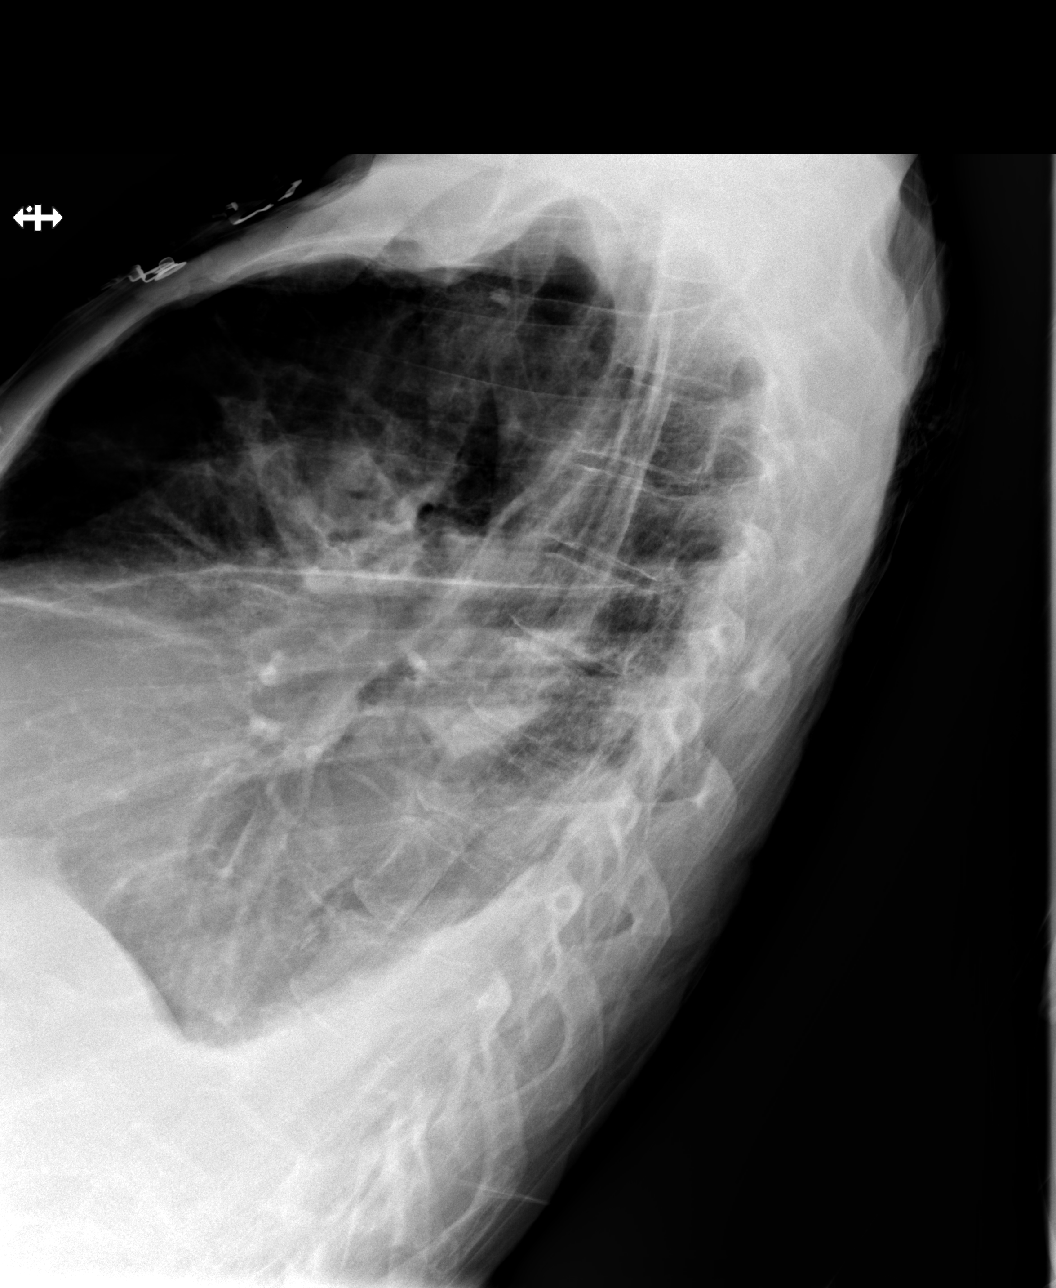

[w chest lat (2 of 2)]
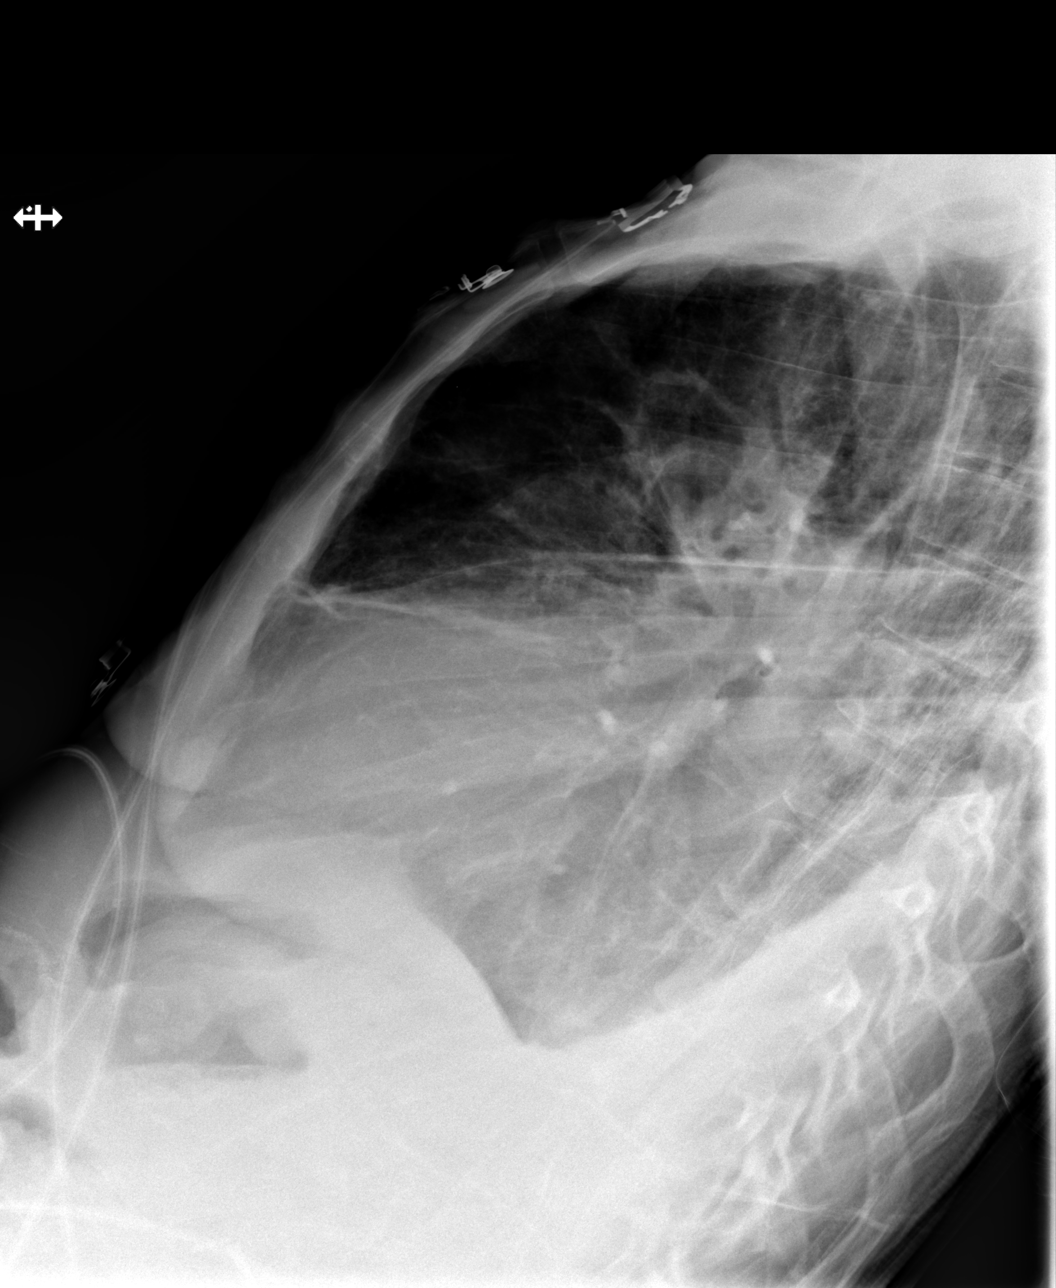

[x chest ap]
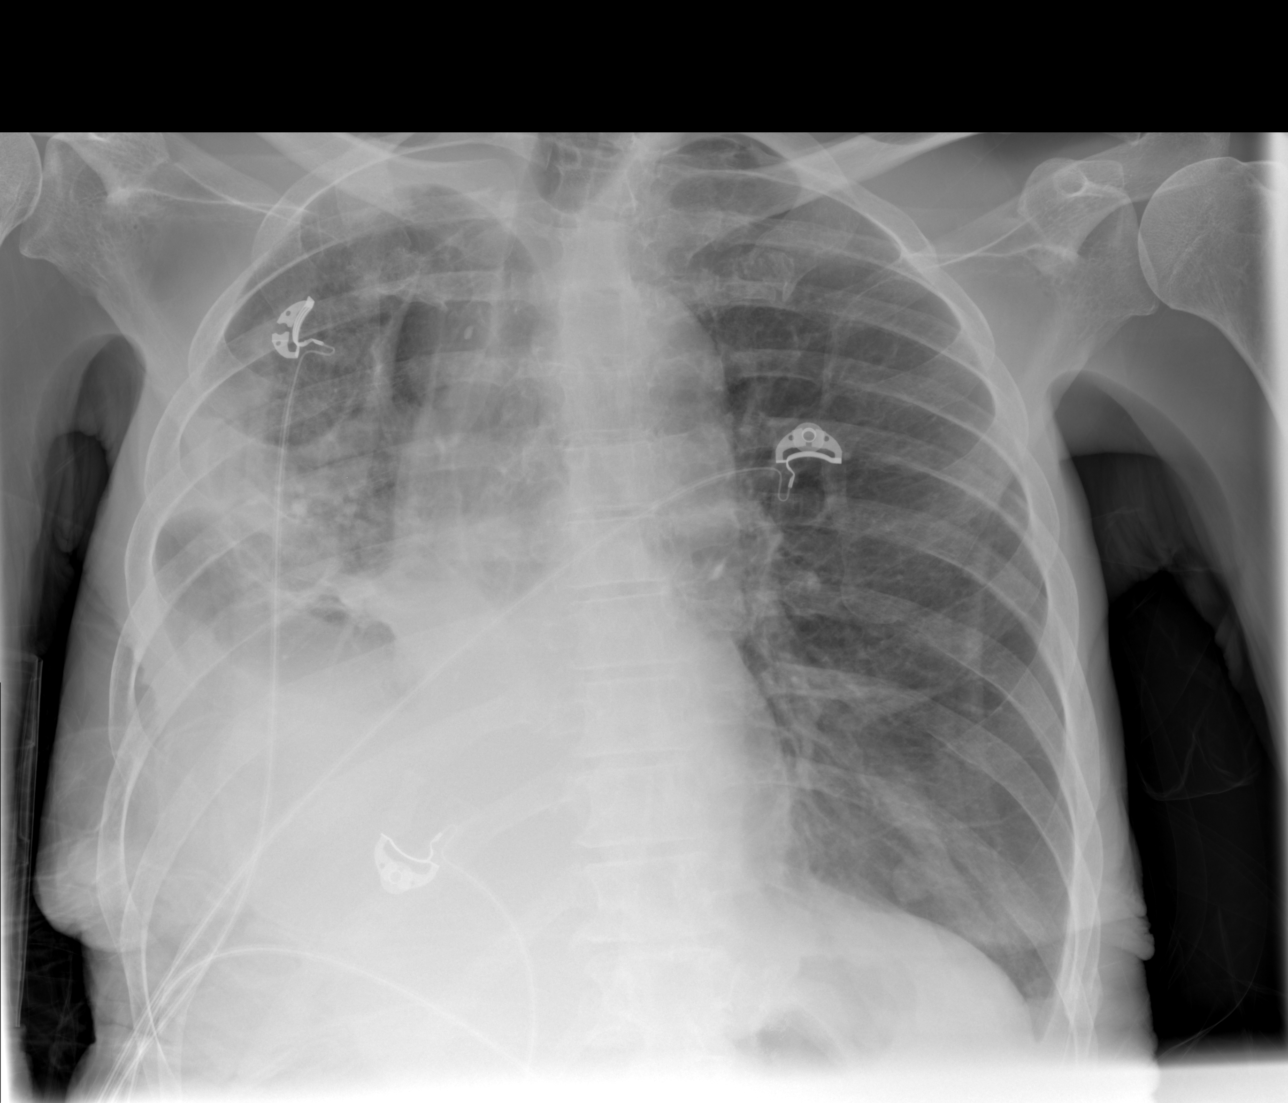

[3 of 3 positions shown; findings below may reference images not displayed]

FINDINGS: Moderate layering right pleural effusion appears increased since
previous study. Increased infiltrate or atelectasis is seen in the
right mid and lower lung. Left lung remains clear. Heart size is
normal.
IMPRESSION: Increased layering right pleural effusion and right mid and lower
lung infiltrate versus atelectasis.

## 2022-04-26 IMAGING — US US THORACENTESIS ASP PLEURAL SPACE W/IMG GUIDE
1 series · 2 of 2 positions shown · non-contrast
Comparison: none

INDICATION: Patient with prior history of upper GI bleed, staph aureus
infection, hepatic abscess, gangrene left great toe, portal vein
thrombosis, ascites, polysubstance abuse, right pleural effusion.
Request received for diagnostic and therapeutic right thoracentesis.

[Series 1: us thoracentesis asp pleural space w/img guide · 2 of 2 slices shown]
[im 1/2]
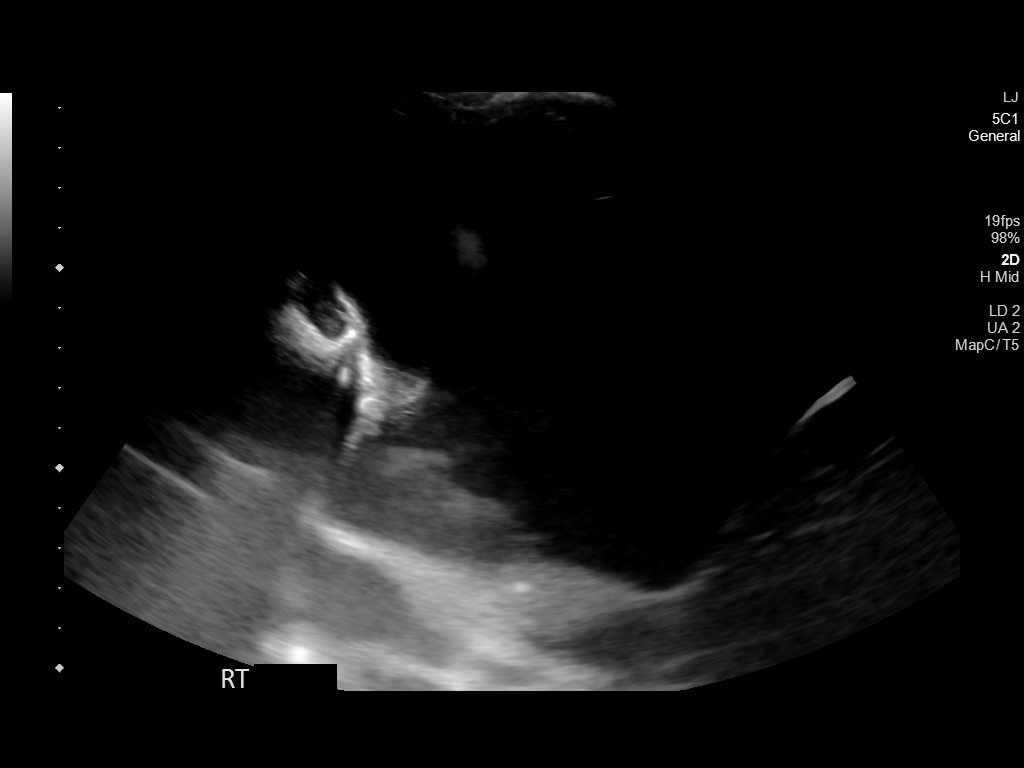
[im 2/2]
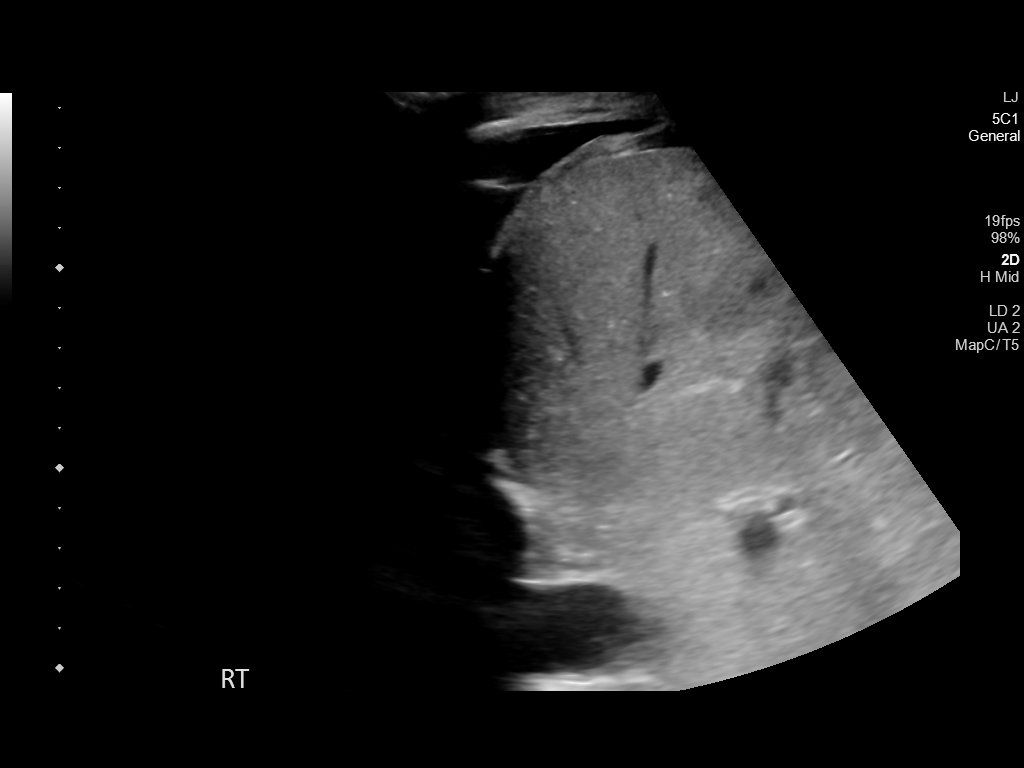

[2 of 2 positions shown; findings below may reference images not displayed]

EXAM:
ULTRASOUND GUIDED DIAGNOSTIC AND THERAPEUTIC RIGHT THORACENTESIS

MEDICATIONS:
10 mL 1% lidocaine

COMPLICATIONS:
None immediate.

PROCEDURE:
An ultrasound guided thoracentesis was thoroughly discussed with the
patient and questions answered. The benefits, risks, alternatives
and complications were also discussed. The patient understands and
wishes to proceed with the procedure. Written consent was obtained.

Ultrasound was performed to localize and mark an adequate pocket of
fluid in the right chest. The area was then prepped and draped in
the normal sterile fashion. 1% Lidocaine was used for local
anesthesia. Under ultrasound guidance a 6 Fr Safe-T-Centesis
catheter was introduced. Thoracentesis was performed. The catheter
was removed and a dressing applied.
FINDINGS: A total of approximately 1.2 liters of turbid, light yellow fluid
was removed. Samples were sent to the laboratory as requested by the
clinical team.
IMPRESSION: Successful ultrasound guided diagnostic and therapeutic right
thoracentesis yielding 1.2 liters of pleural fluid.

## 2022-04-26 IMAGING — DX DG CHEST 1V
1 series · 1 of 1 positions shown · non-contrast
Comparison: Chest x-ray 09/12/2021.

CLINICAL DATA: 45-year-old female status post thoracentesis.

EXAM:
CHEST  1 VIEW

[chest ap]
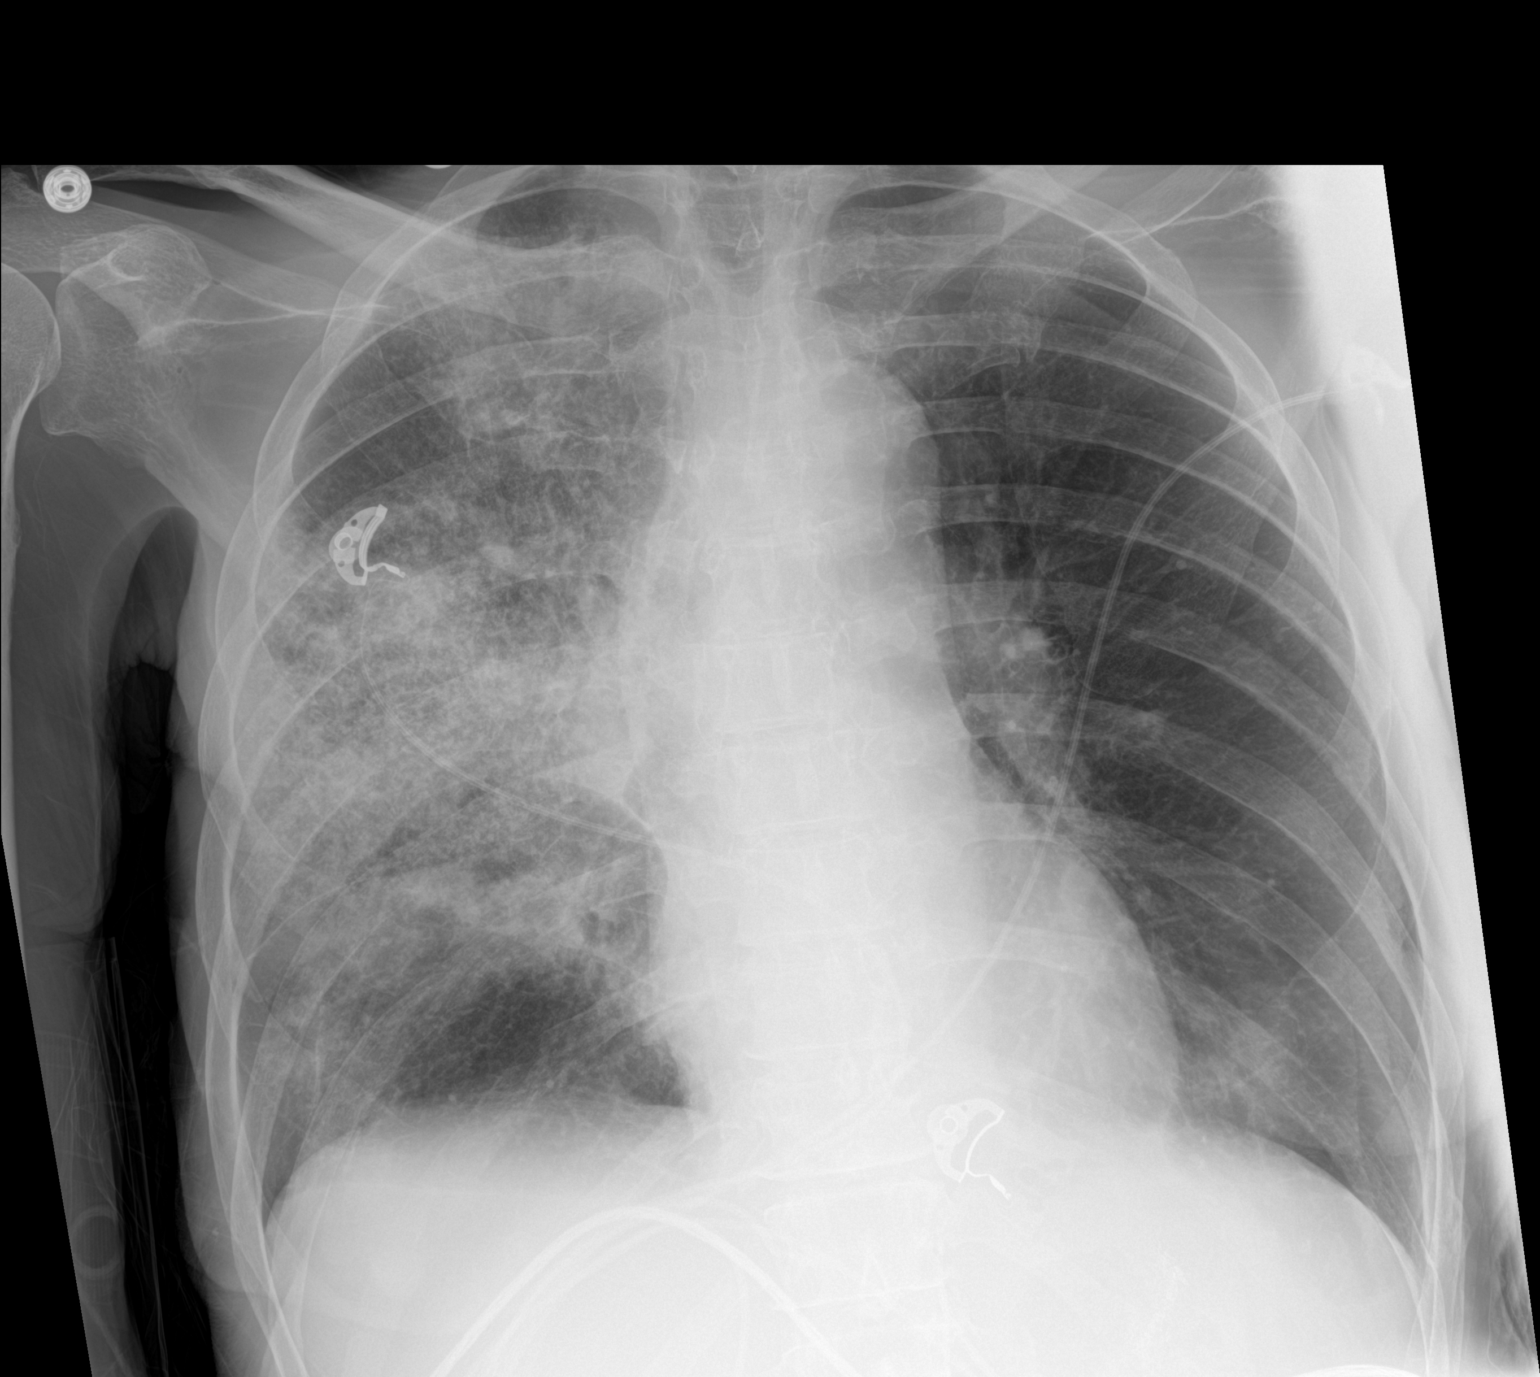

[1 of 1 positions shown; findings below may reference images not displayed]

FINDINGS: Previously noted right-sided pleural effusion has nearly completely
resolved following thoracentesis. No appreciable pneumothorax.
Extensive airspace consolidation is noted throughout the right lung,
most evident in the right mid lung. Patchy ill-defined airspace
disease also noted in the medial aspect of the left lung base. No
left pleural effusion. No pneumothorax. No evidence of pulmonary
edema. Heart size is normal. Upper mediastinal contours are within
normal limits.
IMPRESSION: 1. Near complete resolution of right-sided pleural effusion
following thoracentesis. No postprocedural pneumothorax.
2. Severe multilobar bilateral pneumonia, most pronounced in the
right mid lung. Followup PA and lateral chest X-ray is recommended
in 3-4 weeks following trial of antibiotic therapy to ensure
resolution and exclude underlying malignancy.

## 2022-04-28 IMAGING — DX DG CHEST 1V PORT
1 series · 1 of 1 positions shown · non-contrast
Comparison: September 13, 2021

CLINICAL DATA: Shortness of breath.

EXAM:
PORTABLE CHEST 1 VIEW

[chest ap]
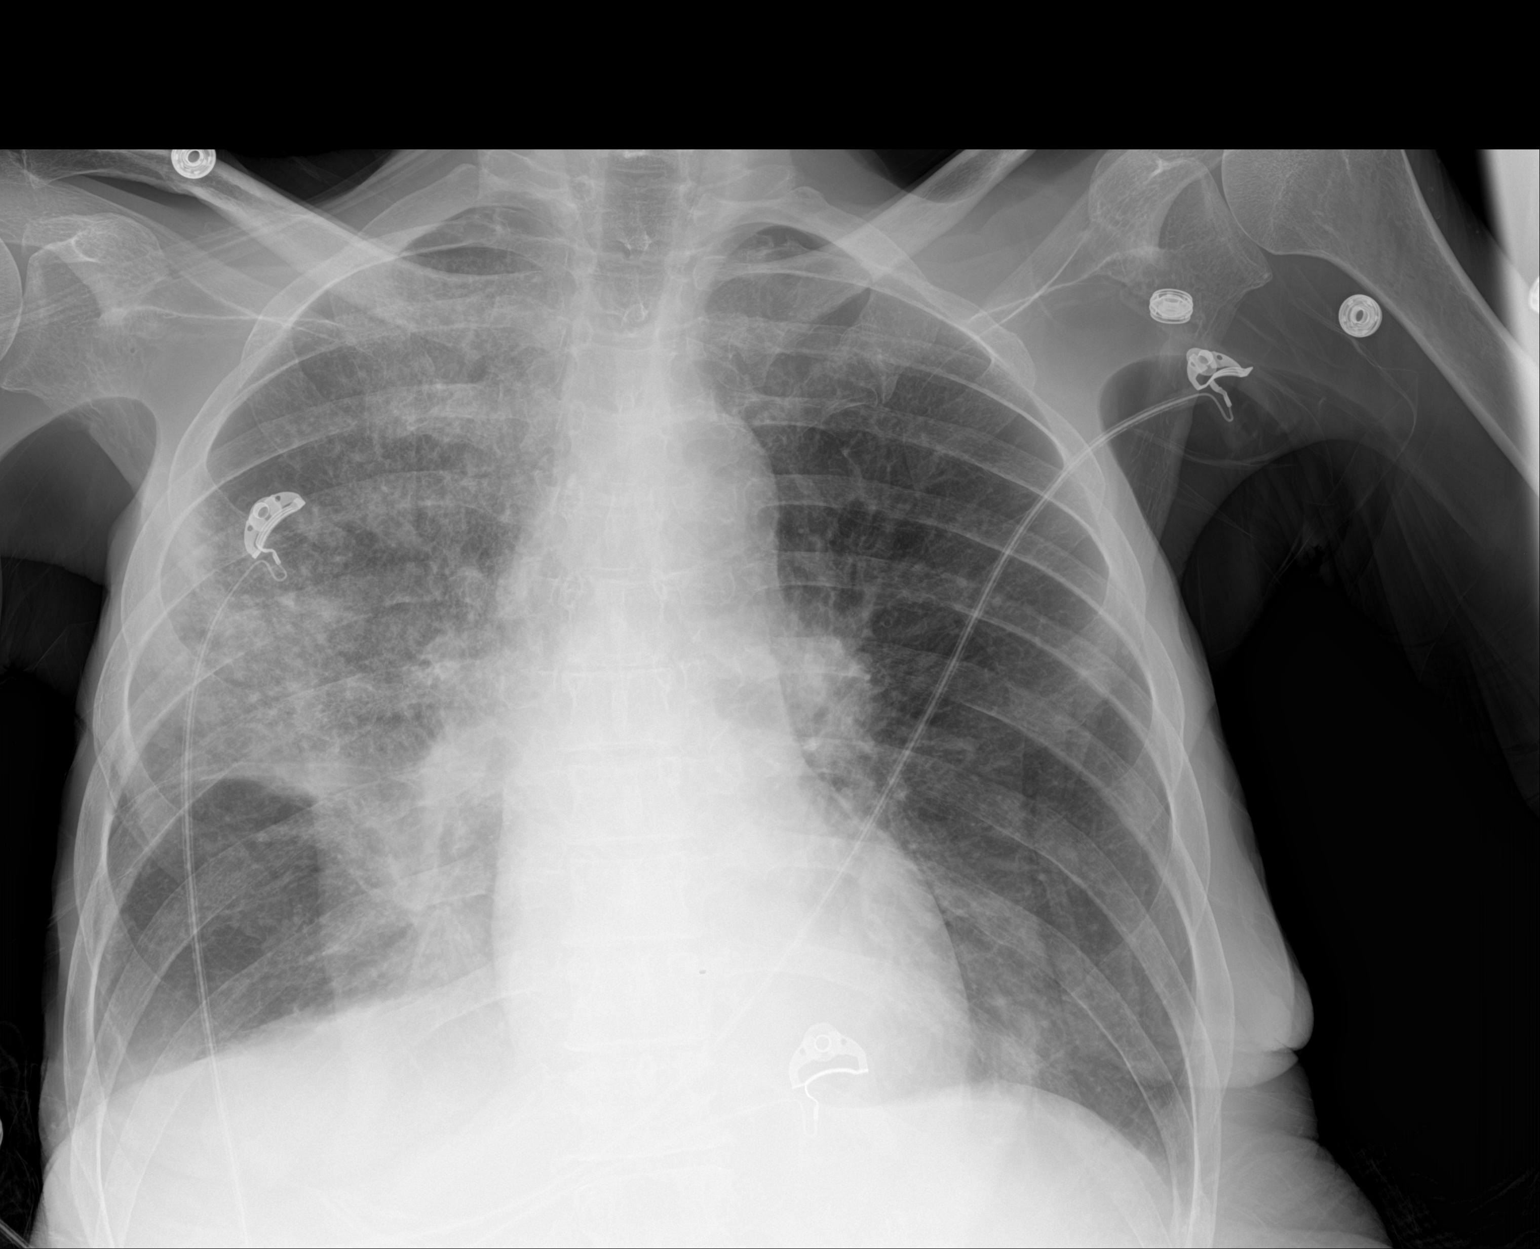

[1 of 1 positions shown; findings below may reference images not displayed]

FINDINGS: The heart size and mediastinal contours are within normal limits.
Grossly stable right upper lobe pneumonia is noted. Mild left
basilar atelectasis or infiltrate is noted as well. The visualized
skeletal structures are unremarkable.
IMPRESSION: Grossly stable right upper lobe pneumonia. Stable mild left basilar
atelectasis or infiltrate.

## 2022-04-29 IMAGING — MR MR FOOT*L* WO/W CM
10 series · 40 of 40 positions shown · IV contrast (GADAVIST)
Comparison: Foot radiograph 08/27/2021.

CLINICAL DATA: Foot swelling, nondiabetic, osteomyelitis suspected
swelling first toe

EXAM:
MRI OF THE LEFT FOREFOOT WITHOUT AND WITH CONTRAST
TECHNIQUE: Multiplanar, multisequence MR imaging of the left forefoot was
performed both before and after administration of intravenous
contrast.
CONTRAST:  6mL GADAVIST GADOBUTROL 1 MMOL/ML IV SOLN

[Series 4: T1 · coronal · left · 3.0mm · 0.47mm/px · 6 of 40 slices shown (1 of 2)]
[im 1/40]
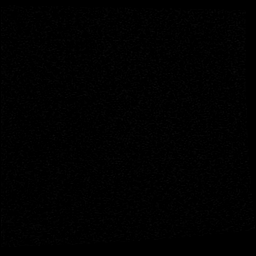
[im 8/40]
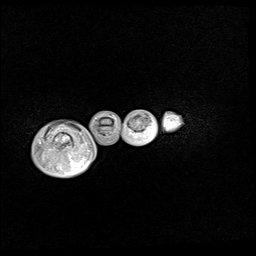
[im 16/40]
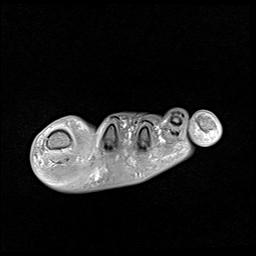
[im 24/40]
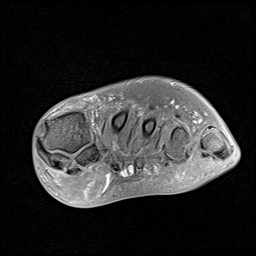
[im 32/40]
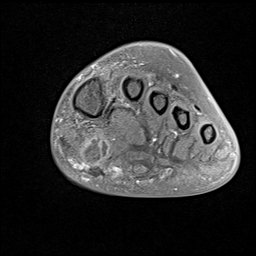
[im 40/40]
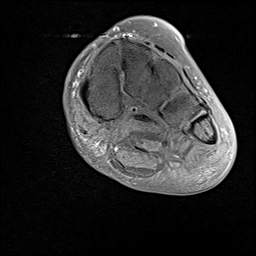

[Series 5: T2 fat-sat · coronal · left · 3.0mm · 0.38mm/px · 5 of 41 slices shown (1 of 2)]
[im 1/41]
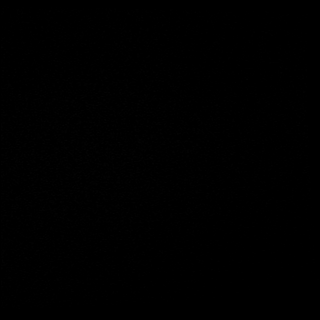
[im 11/41]
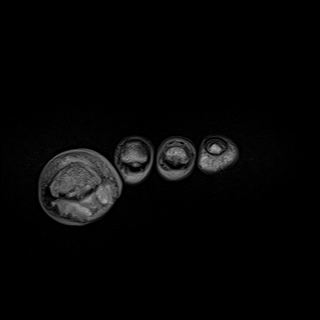
[im 21/41]
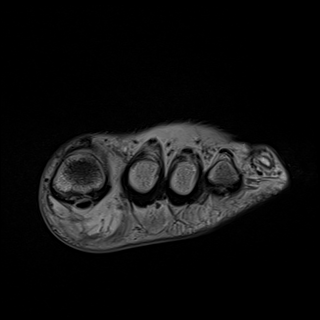
[im 31/41]
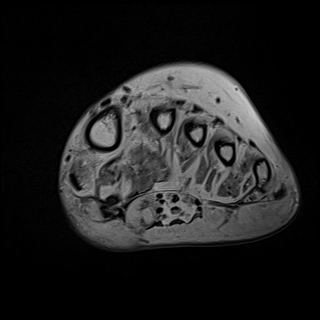
[im 41/41]
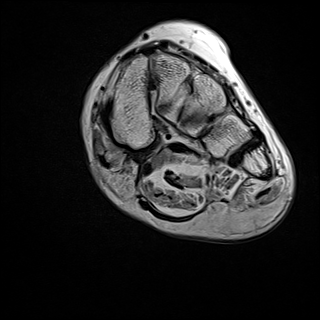

[Series 6: T2 fat-sat · oblique · left · 3.0mm · 0.70mm/px · 3 of 28 slices shown (2 of 2)]
[im 1/28]
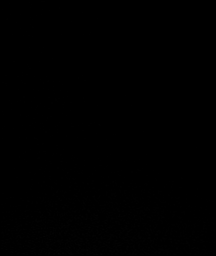
[im 14/28]
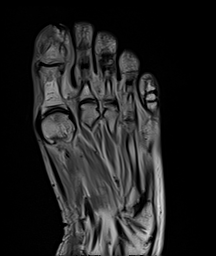
[im 28/28]
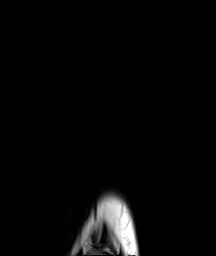

[Series 7: T1 · oblique · left · 3.0mm · 0.70mm/px · 3 of 26 slices shown (2 of 2)]
[im 1/26]
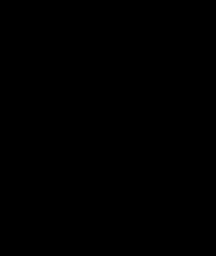
[im 13/26]
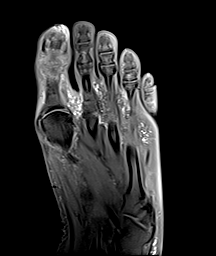
[im 26/26]
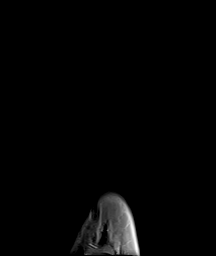

[Series 8: STIR · sagittal · left · 3.0mm · 0.35mm/px · 3 of 28 slices shown (1 of 2)]
[im 1/28]
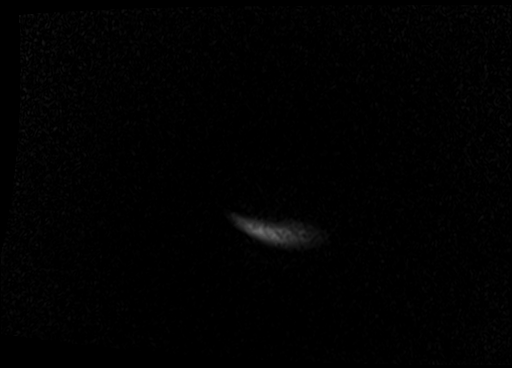
[im 14/28]
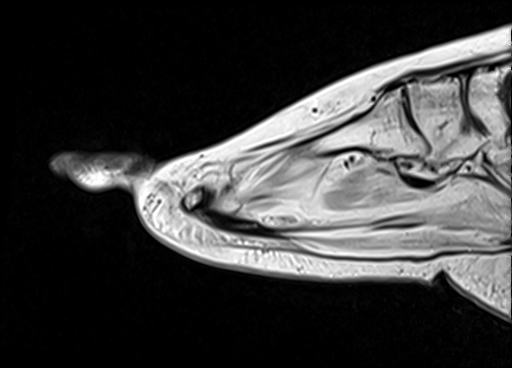
[im 28/28]
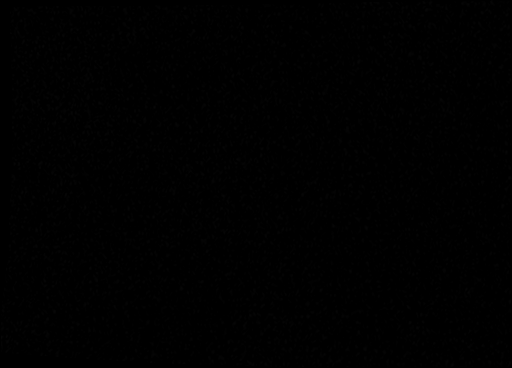

[Series 9: T1 fat-sat · coronal · non-contrast · left · 3.0mm · 0.47mm/px · 5 of 41 slices shown]
[im 1/41]
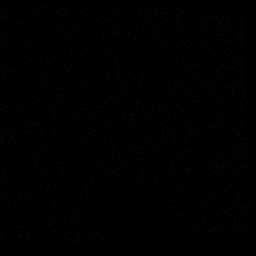
[im 11/41]
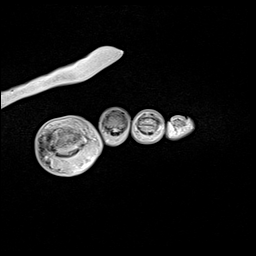
[im 21/41]
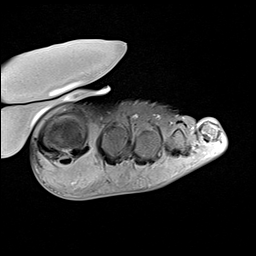
[im 31/41]
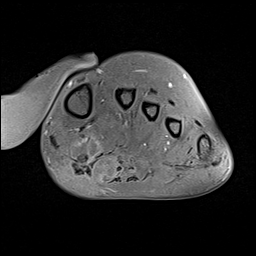
[im 41/41]
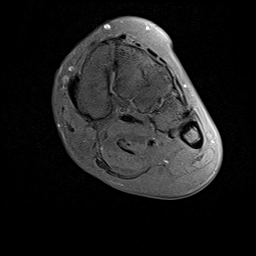

[Series 10: STIR · oblique · left · 3.0mm · 0.35mm/px · 3 of 28 slices shown (2 of 2)]
[im 1/28]
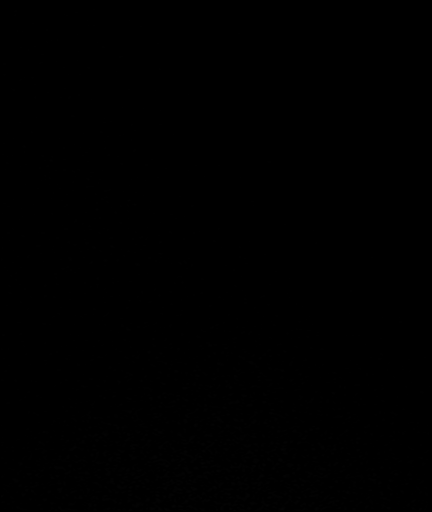
[im 14/28]
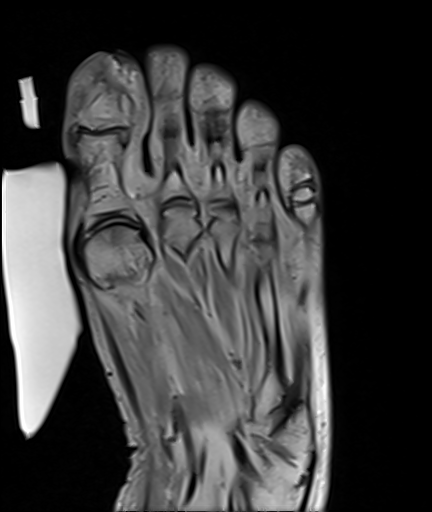
[im 28/28]
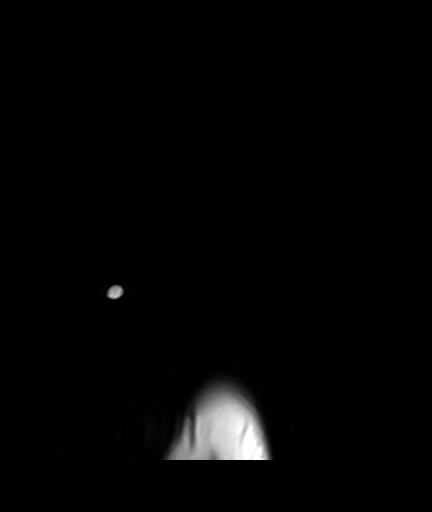

[Series 11: T1 fat-sat post-contrast · coronal · left · 3.0mm · 0.47mm/px · 5 of 41 slices shown (1 of 3)]
[im 1/41]
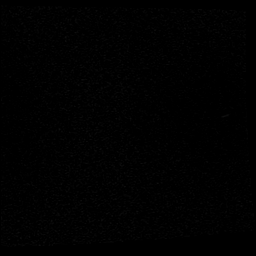
[im 11/41]
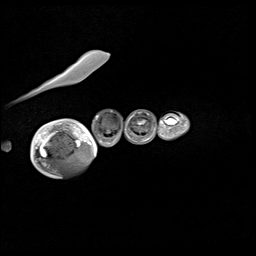
[im 21/41]
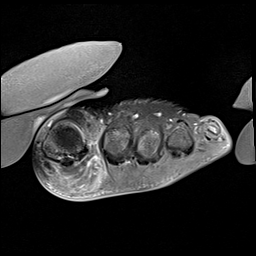
[im 31/41]
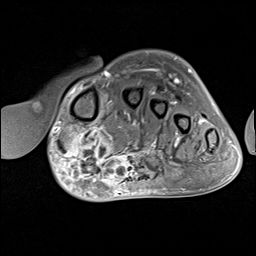
[im 41/41]
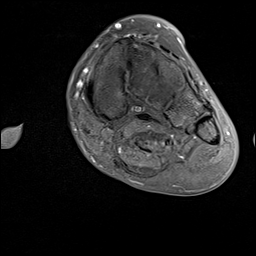

[Series 12: T1 fat-sat post-contrast · sagittal · left · 3.0mm · 0.35mm/px · 4 of 30 slices shown (2 of 3)]
[im 1/30]
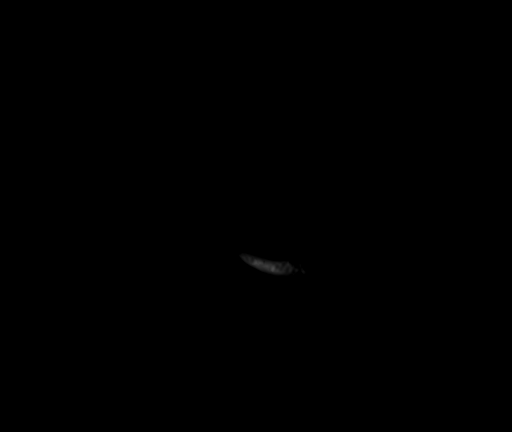
[im 10/30]
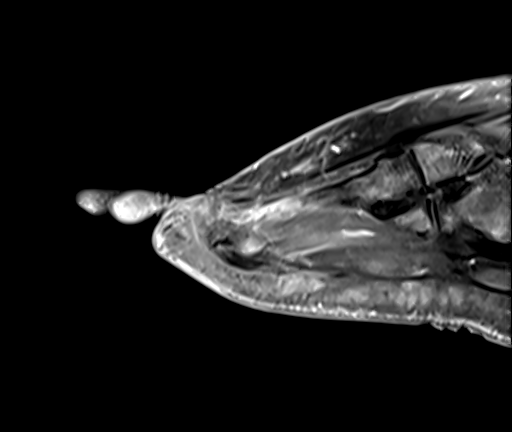
[im 20/30]
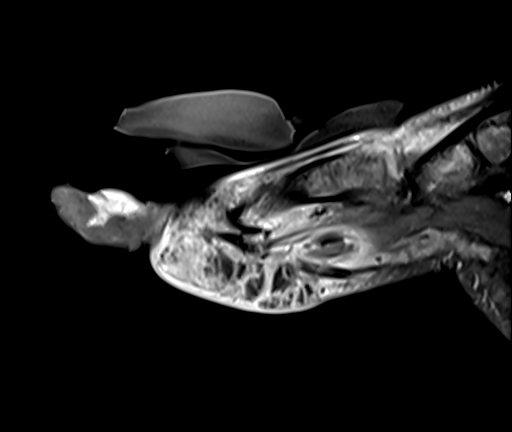
[im 30/30]
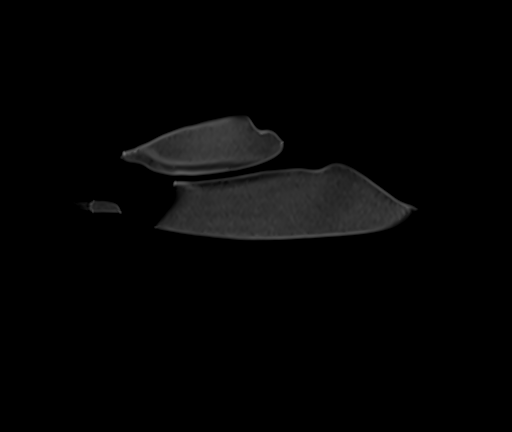

[Series 13: T1 fat-sat post-contrast · oblique · left · 3.0mm · 0.56mm/px · 3 of 28 slices shown (3 of 3)]
[im 1/28]
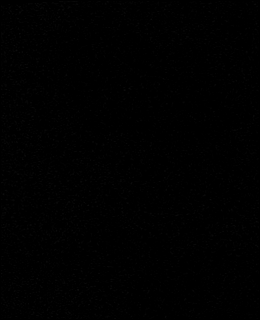
[im 14/28]
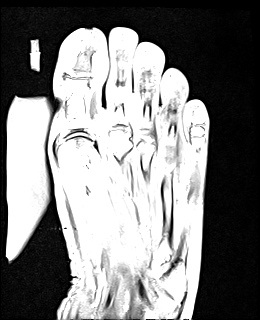
[im 28/28]
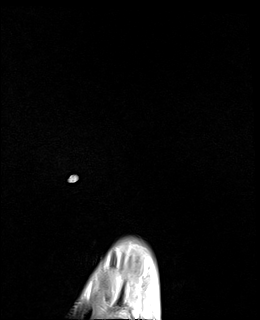

[40 of 40 positions shown; findings below may reference images not displayed]

FINDINGS: There is failure of fat saturation and fat inversion limiting
evaluation on T2 weighted and STIR sequences.

Bones/Joint/Cartilage

There is bony irregularity of the distal phalanx of the great toe,
and abnormal marrow signal within the distal phalanx and distal
aspect of the proximal phalanx.

Ligaments

Intact MTP collateral ligaments.  Intact Lisfranc ligament.

Muscles and Tendons

There is rim enhancing fluid surrounding the flexor hallucis longus
tendon at the level of the mid metatarsal (axial T1 postcontrast
image 32). There is also abnormal signal of the flexor hallucis
longus tendon just proximal to the sesamoids.

Soft tissues

There is a soft tissue ulcer at the tip of the great toe, with
underlying non enhancement of the skin and underlying soft tissues
suggesting necrosis. There is an fluid tracking from the ulcer in
the proximal direction along the great toe, with rim enhancing fluid
extending to the level of the mid metatarsal superficially and along
the flexor hallucis longus tendon in the deeper tissues (sagittal
postcontrast image 8). Overall this probable abscess measures up to
1.6 cm short axis and 7.3 cm in length (series 11, image 23, series
12, image 8).
IMPRESSION: Poor image quality due to failure of fat signal suppression.
Probable osteomyelitis of the distal phalanx of the great toe and
questionable osteomyelitis involving the distal aspect of the
proximal phalanx. Soft tissue necrosis at the tip of the great toe
with rim enhancing abscess extending from the great toe ulceration
proximally to the level of the mid metatarsal and with deeper
tenosynovial involvement of the flexor hallucis longus tendon in the
midfoot. Overall this abscess measures up to 1.6 cm short axis and
7.3 cm in length.

## 2022-05-01 IMAGING — CT CT CHEST W/ CM
2 of 4 series · 15 of 36 positions shown, 18 images · IV contrast (agent unspecified)
Comparison: June 23, 2021

CLINICAL DATA: Pneumonia.

EXAM:
CT CHEST WITH CONTRAST
TECHNIQUE: Multidetector CT imaging of the chest was performed during
intravenous contrast administration.

[Series 2: axial st · axial · 0.78mm/px · z∈[+67,+375]mm · 12 of 183 slices shown, 15 images]
[im 15/183  mediastinal]
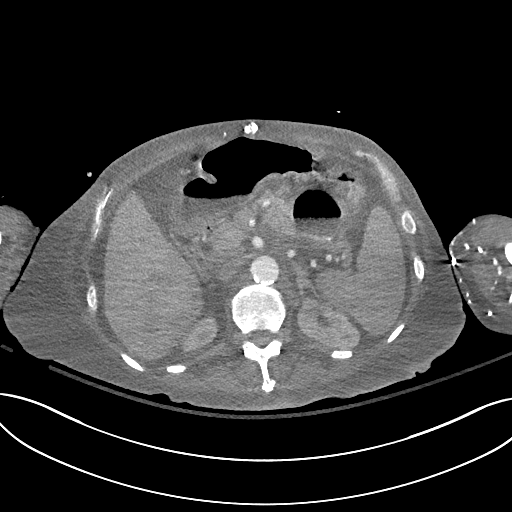
[im 15/183  lung]
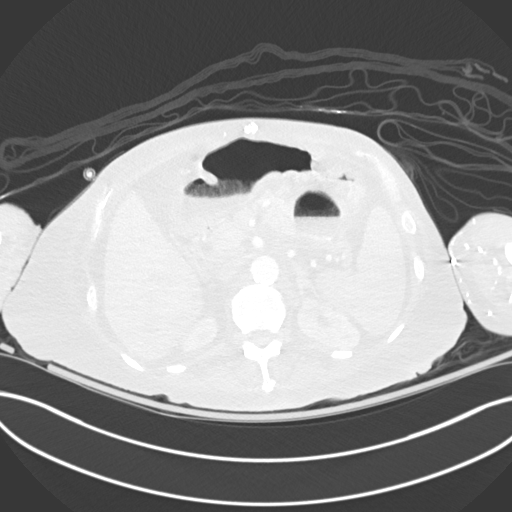
[im 29/183  lung]
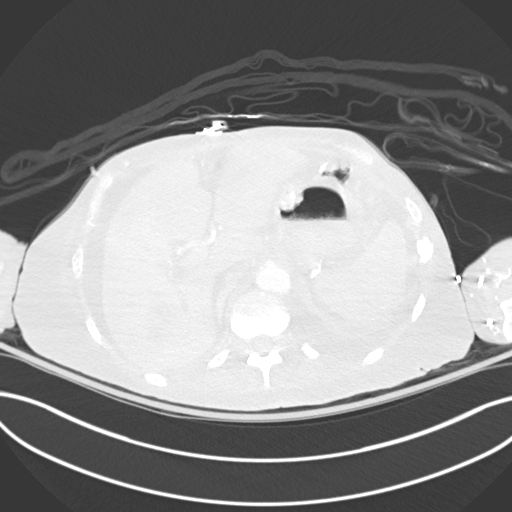
[im 43/183  lung]
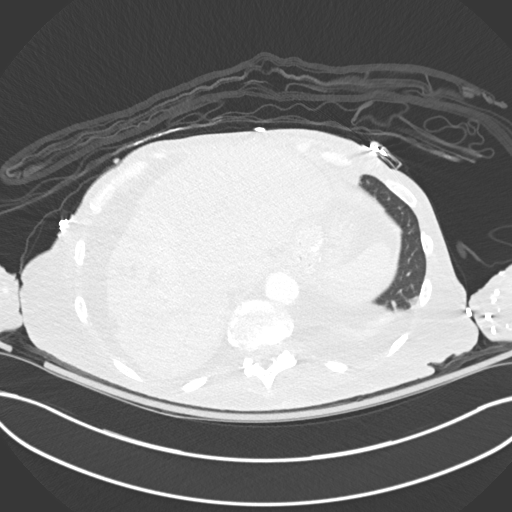
[im 57/183  lung]
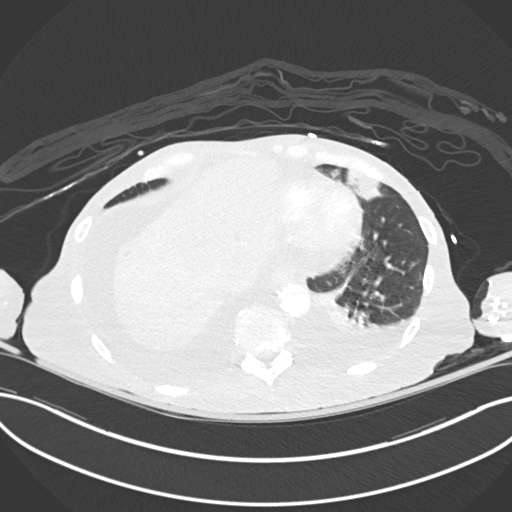
[im 71/183  mediastinal]
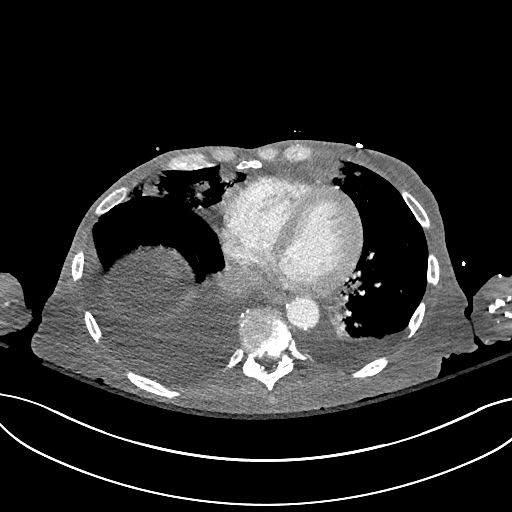
[im 71/183  lung]
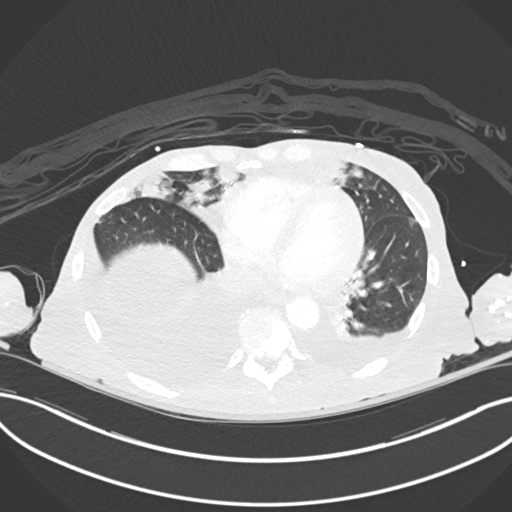
[im 85/183  lung]
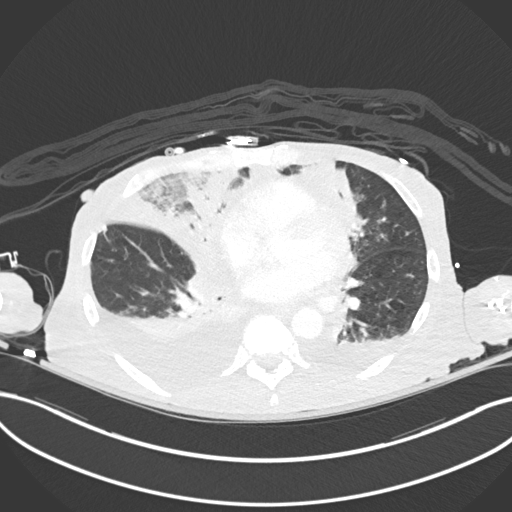
[im 99/183  lung]
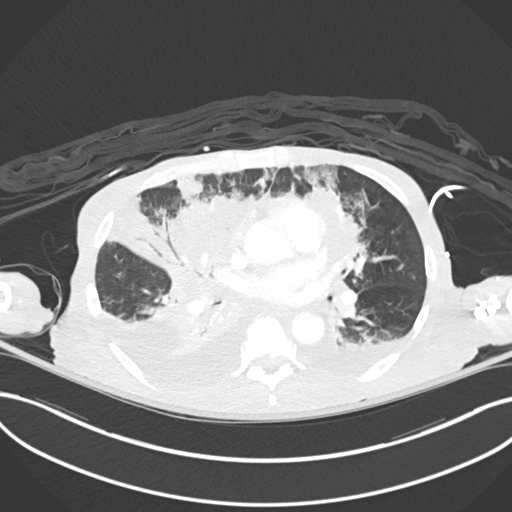
[im 113/183  lung]
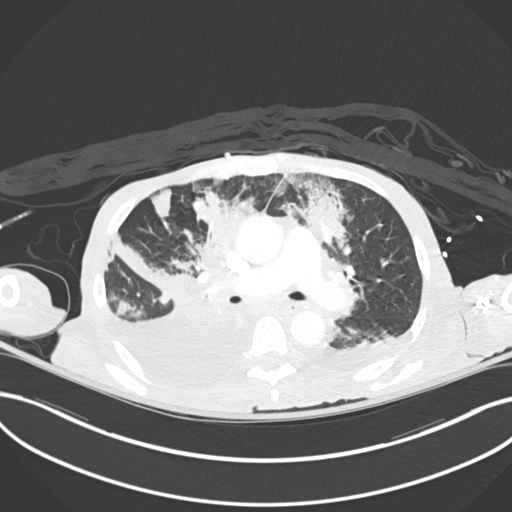
[im 127/183  mediastinal]
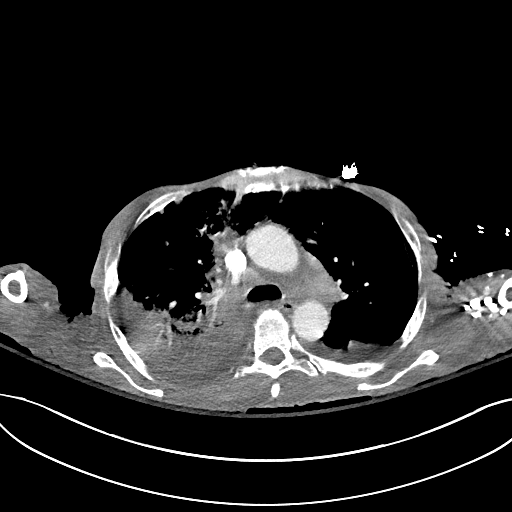
[im 127/183  lung]
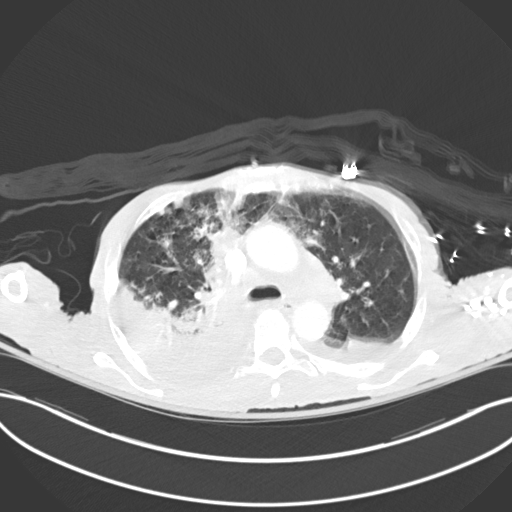
[im 141/183  lung]
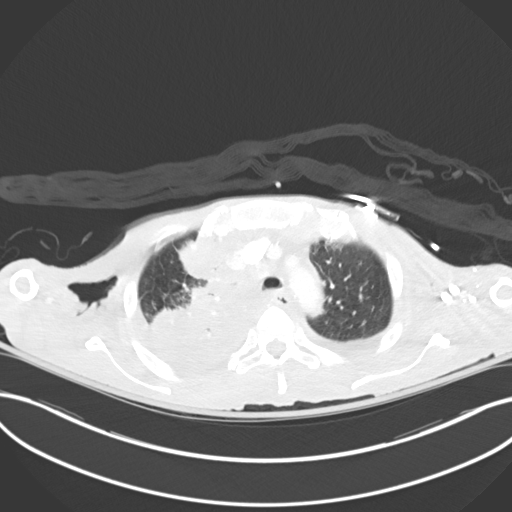
[im 155/183  lung]
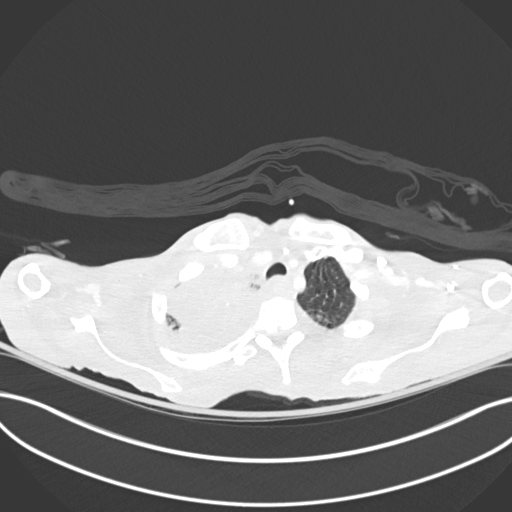
[im 169/183  lung]
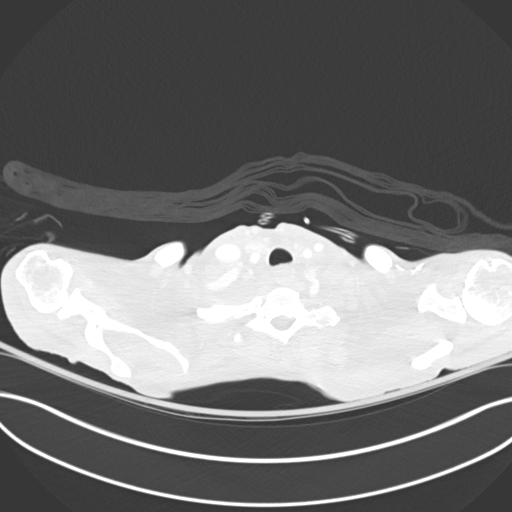

[Series 5: coronal · coronal · 0.67mm/px · 3 of 115 slices shown]
[im 23/115  lung]
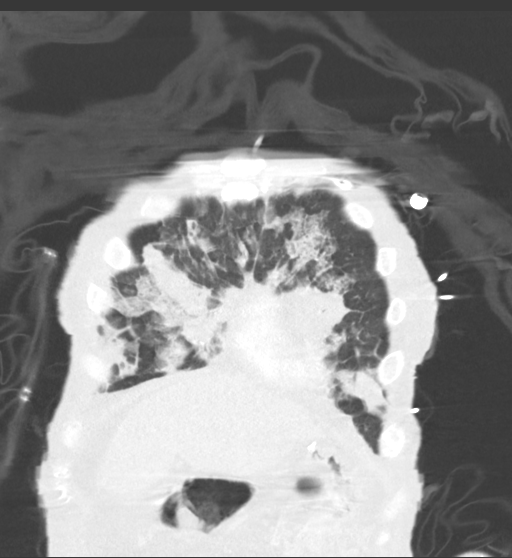
[im 46/115  lung]
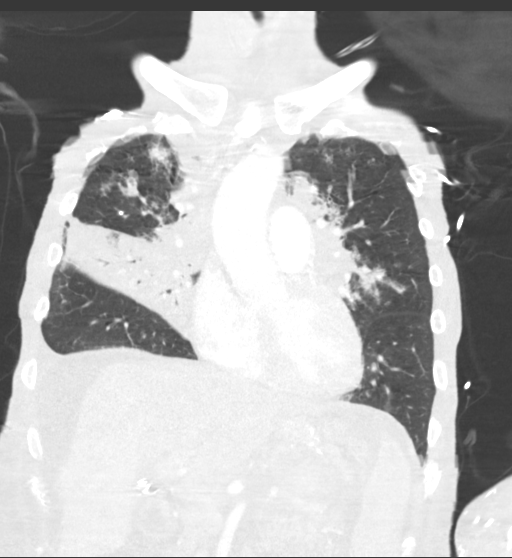
[im 69/115  lung]
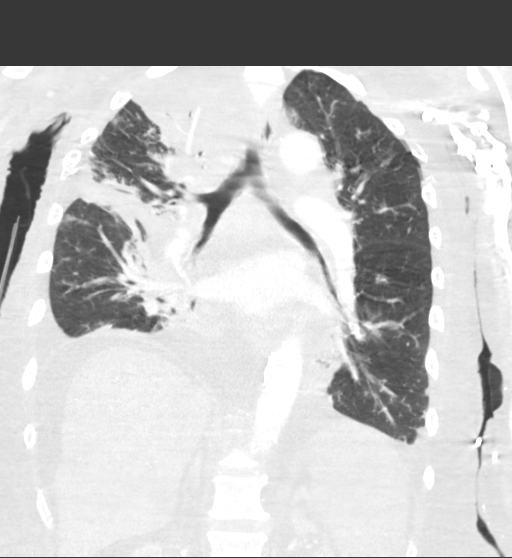

[15 of 36 positions shown; findings below may reference images not displayed]

RADIATION DOSE REDUCTION: This exam was performed according to the
departmental dose-optimization program which includes automated
exposure control, adjustment of the mA and/or kV according to
patient size and/or use of iterative reconstruction technique.

CONTRAST:  75mL OMNIPAQUE IOHEXOL 300 MG/ML  SOLN
FINDINGS: Cardiovascular: No significant vascular findings. Normal heart size.
No pericardial effusion.

Mediastinum/Nodes: No enlarged mediastinal, hilar, or axillary lymph
nodes. Thyroid gland, trachea, and esophagus demonstrate no
significant findings.

Lungs/Pleura: Very large areas of consolidation are seen throughout
the right upper lobe, right middle lobe and right lower lobe. Marked
severity multifocal bilateral infiltrates are also seen. This
represents a new finding when compared to the prior study.

There is a small left pleural effusion. A moderate sized right
pleural effusion is also noted.

No pneumothorax is identified.

Upper Abdomen: Multiple surgical clips are seen within the
gallbladder fossa. There is mild central intrahepatic biliary
dilatation.

Multiple ill-defined low-attenuation liver lesions are seen within
the right lobe of the liver. The largest measures approximately
cm x 2.6 cm (axial CT image 162, CT series 2). These are not
visualized on the prior study.

Surgical sutures are seen along the gastric region.

Marked severity ascites is seen within the visualized portion of the
abdomen.

Musculoskeletal: There is marked severity anasarca throughout the
chest wall. No acute or significant osseous findings.
IMPRESSION: 1. Very large areas of consolidation throughout the right upper
lobe, right middle lobe and right lower lobe, with marked severity
multifocal bilateral infiltrates. While this is likely, in part,
infectious in etiology, an underlying neoplastic process cannot be
excluded.
2. Bilateral pleural effusions, right greater than left.
3. Multiple ill-defined low-attenuation liver lesions within the
right lobe of the liver which are not visualized on the prior study.
These are concerning for metastatic disease. Correlation with MRI is
recommended.
4. Marked severity anasarca.
5. Ascites.

## 2022-05-01 IMAGING — DX DG ABD PORTABLE 1V
1 series · 1 of 1 positions shown · non-contrast
Comparison: None.

CLINICAL DATA: Abdominal pain

EXAM:
PORTABLE ABDOMEN - 1 VIEW

[abdomen kub]
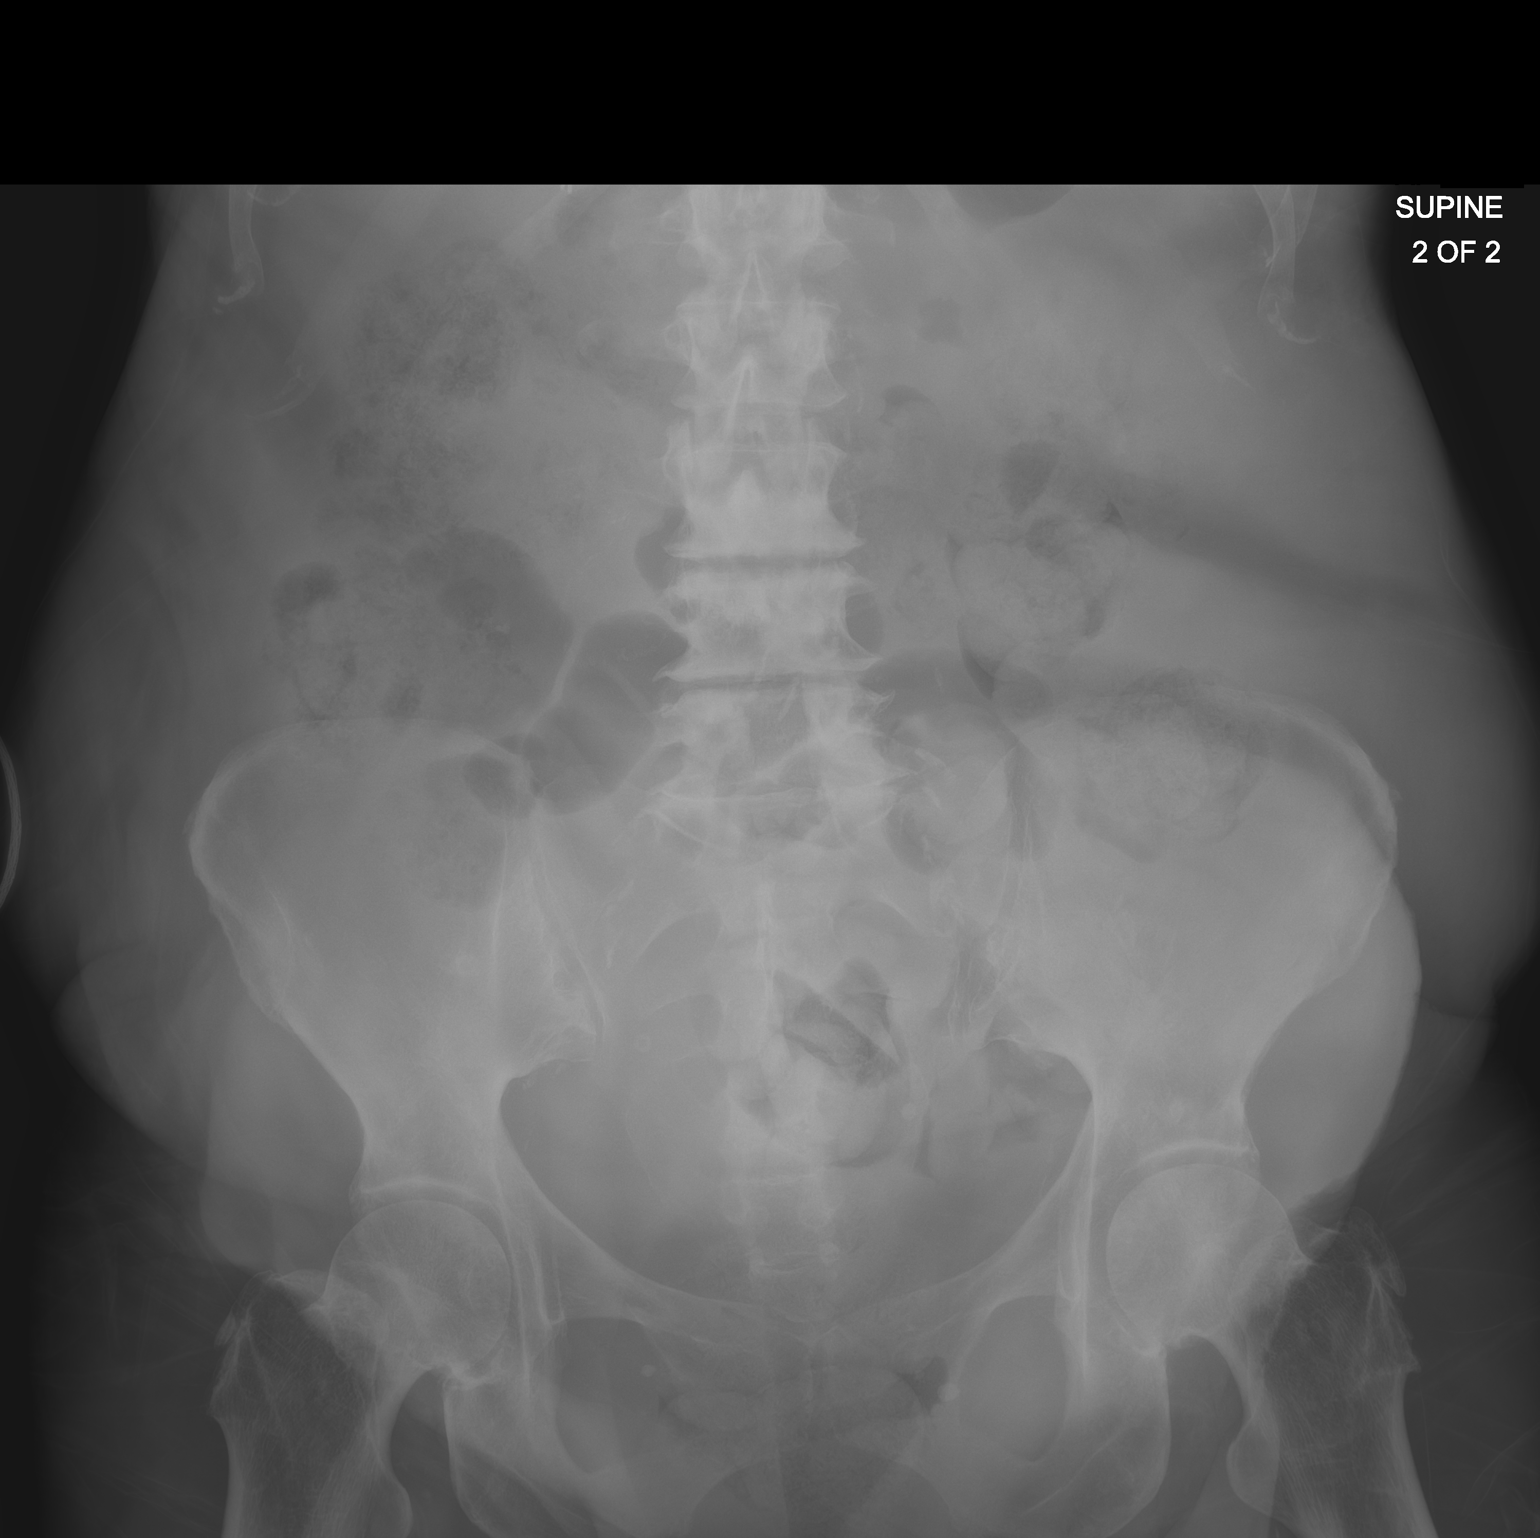

[1 of 1 positions shown; findings below may reference images not displayed]

FINDINGS: No evidence of bowel obstruction. Right upper quadrant surgical
clips. Moderate stool burden. Subacute-chronic left anterior ninth
rib injury. Degenerative disc disease of the lumbar spine worst at
L3-L4 and L4-L5.
IMPRESSION: No evidence of bowel obstruction.

## 2022-05-01 IMAGING — DX DG CHEST 1V PORT
1 series · 1 of 1 positions shown · non-contrast
Comparison: Radiograph 09/15/2021

CLINICAL DATA: Tachypnea

EXAM:
PORTABLE CHEST 1 VIEW

[chest ap]
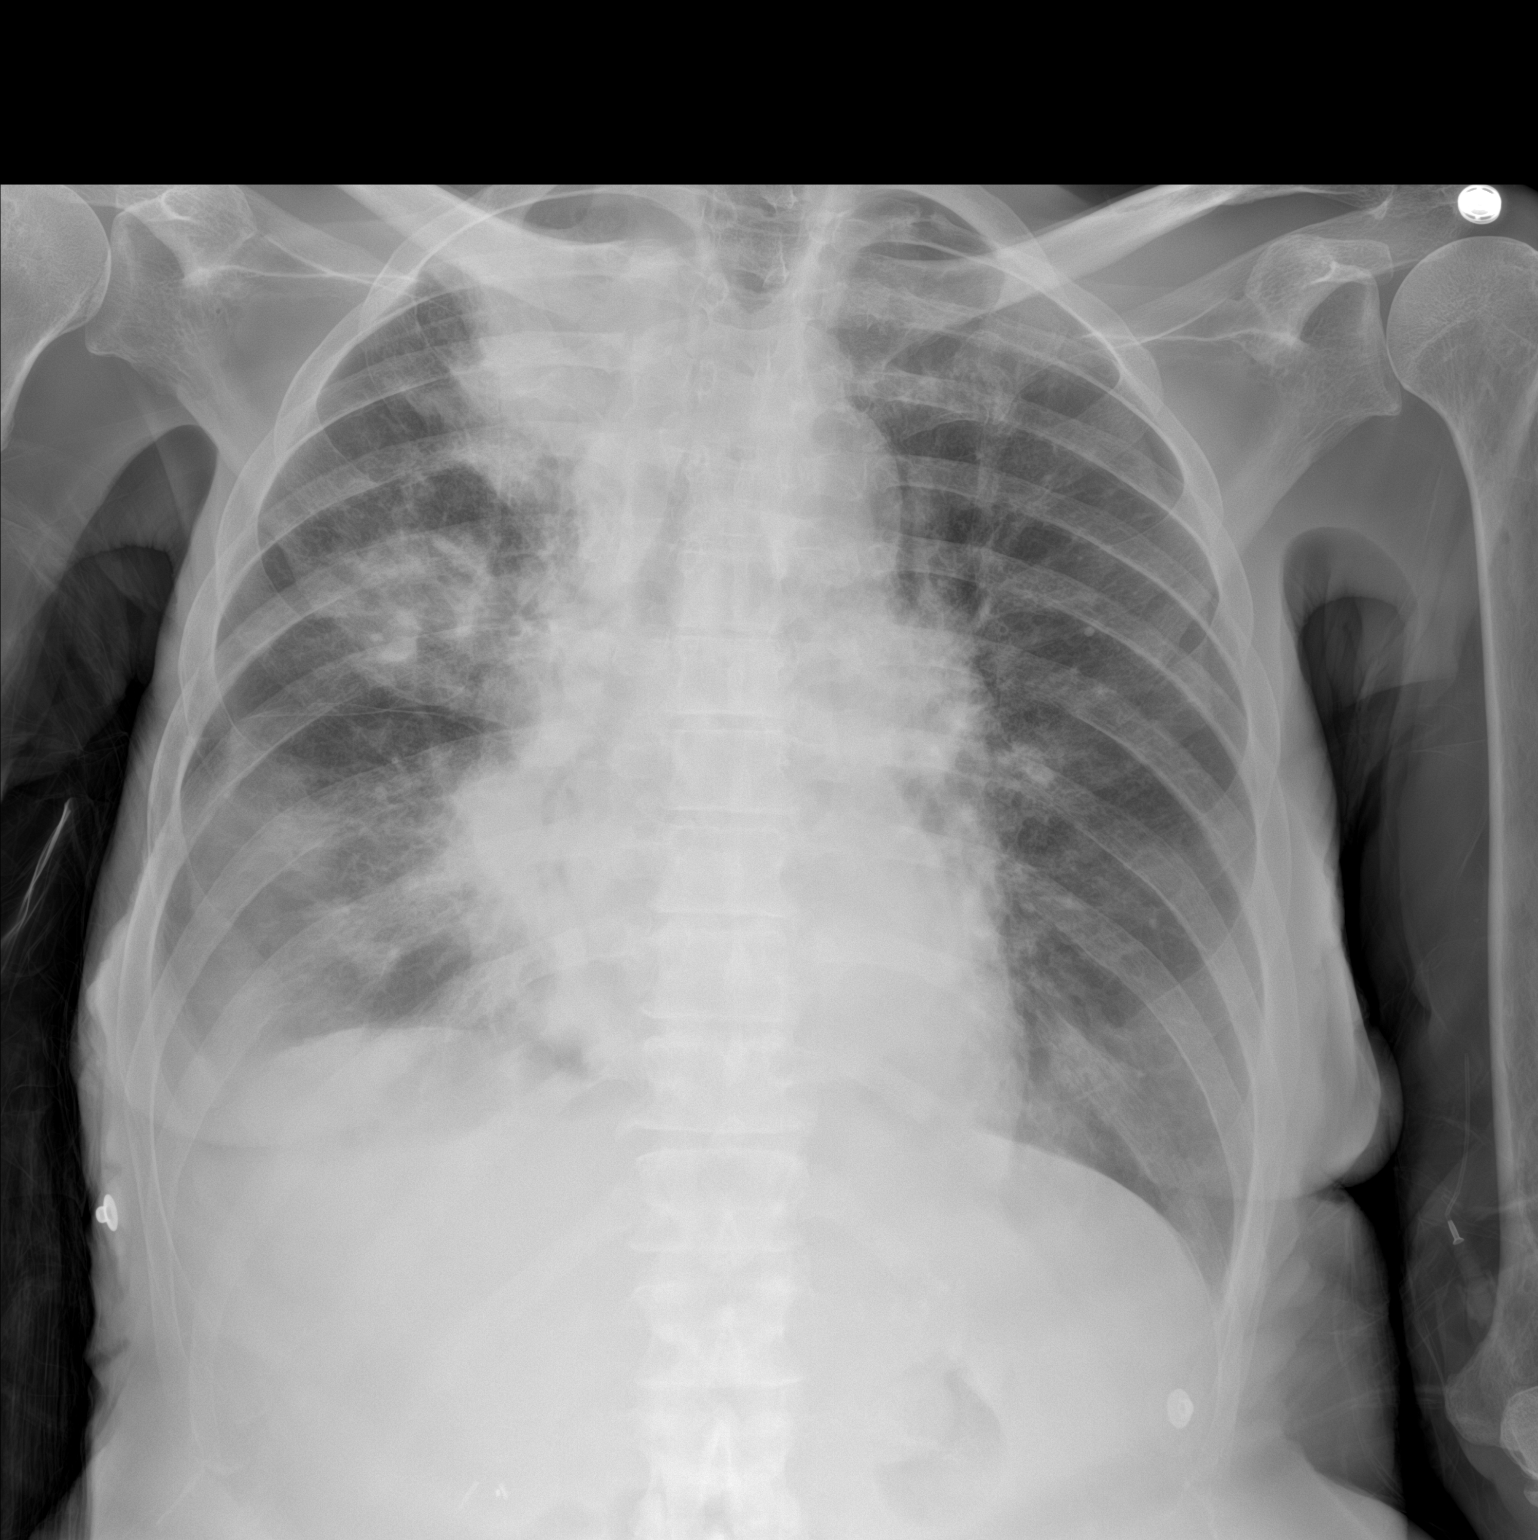

[1 of 1 positions shown; findings below may reference images not displayed]

FINDINGS: There is increased density of confluent airspace disease in the
right upper lung and increasing airspace opacities in the right
lower lung. Small layering right pleural effusion. No visible
pneumothorax.
IMPRESSION: Increased density of confluent airspace disease in the right upper
lung and increasing opacity in the right lower lung other due to
airspace disease or increased layering right pleural effusion.

## 2022-05-02 IMAGING — DX DG CHEST 1V PORT
1 series · 1 of 1 positions shown · non-contrast
Comparison: Radiograph and CT yesterday

CLINICAL DATA: Dyspnea.

EXAM:
PORTABLE CHEST 1 VIEW

[chest ap]
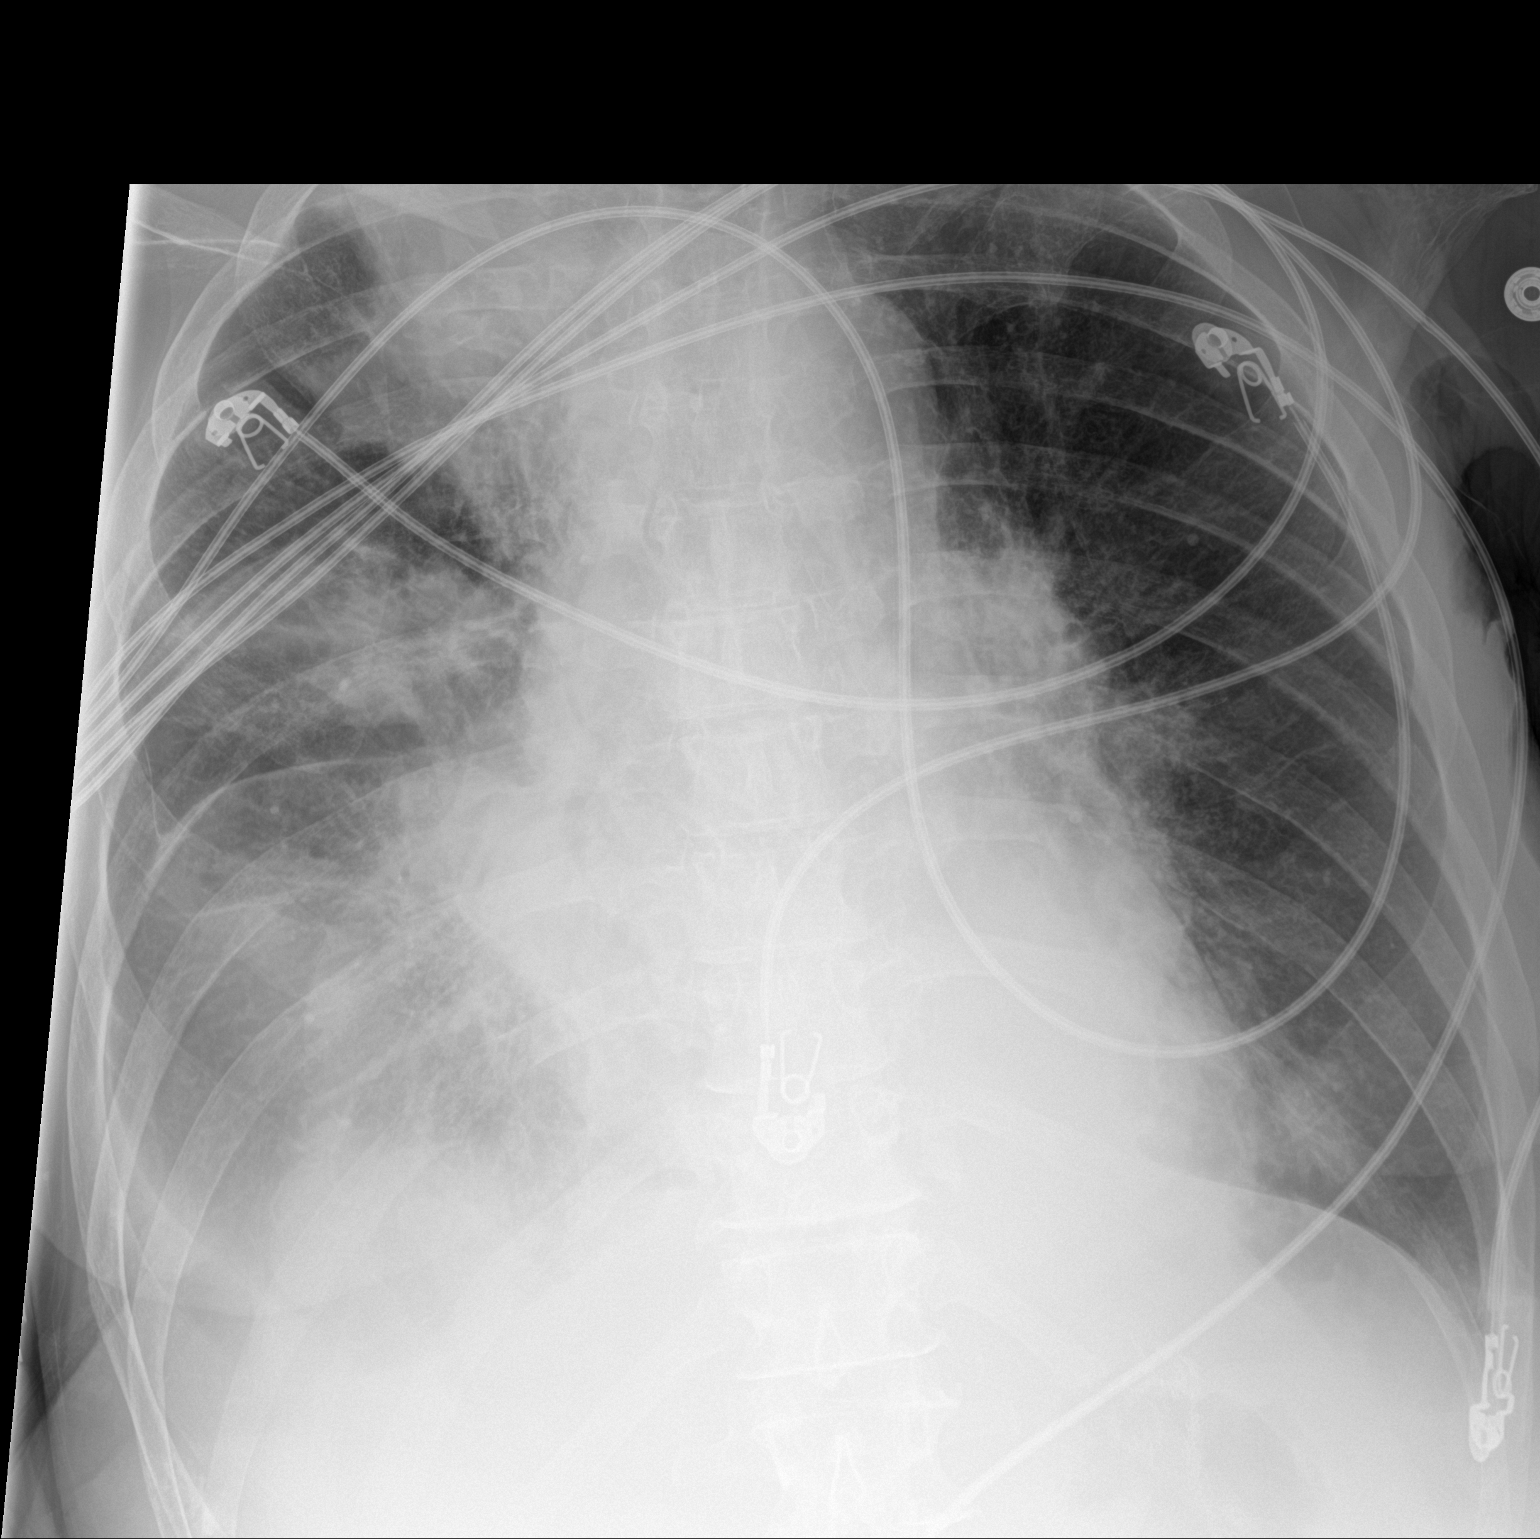

[1 of 1 positions shown; findings below may reference images not displayed]

FINDINGS: Extensive multifocal right lung opacities with slight worsening
aeration at the right lung base. This may be due to worsening
airspace disease are increasing pleural effusion. The left lung
airspace disease was better appreciated on yesterday's CT. Left
pleural effusion is similar by radiograph. No pneumothorax.
IMPRESSION: 1. Extensive multifocal right lung opacities with slight worsening
aeration at the right lung base. This may be due to worsening
airspace disease or increasing pleural effusion.
2. Left pleural effusion is similar. Left lung opacities on CT are
not well demonstrated.

## 2022-05-03 IMAGING — DX DG ABDOMEN 1V
1 series · 1 of 1 positions shown · non-contrast
Comparison: 09/20/2021

CLINICAL DATA: NG tube placement

EXAM:
ABDOMEN - 1 VIEW

[abdomen kub]
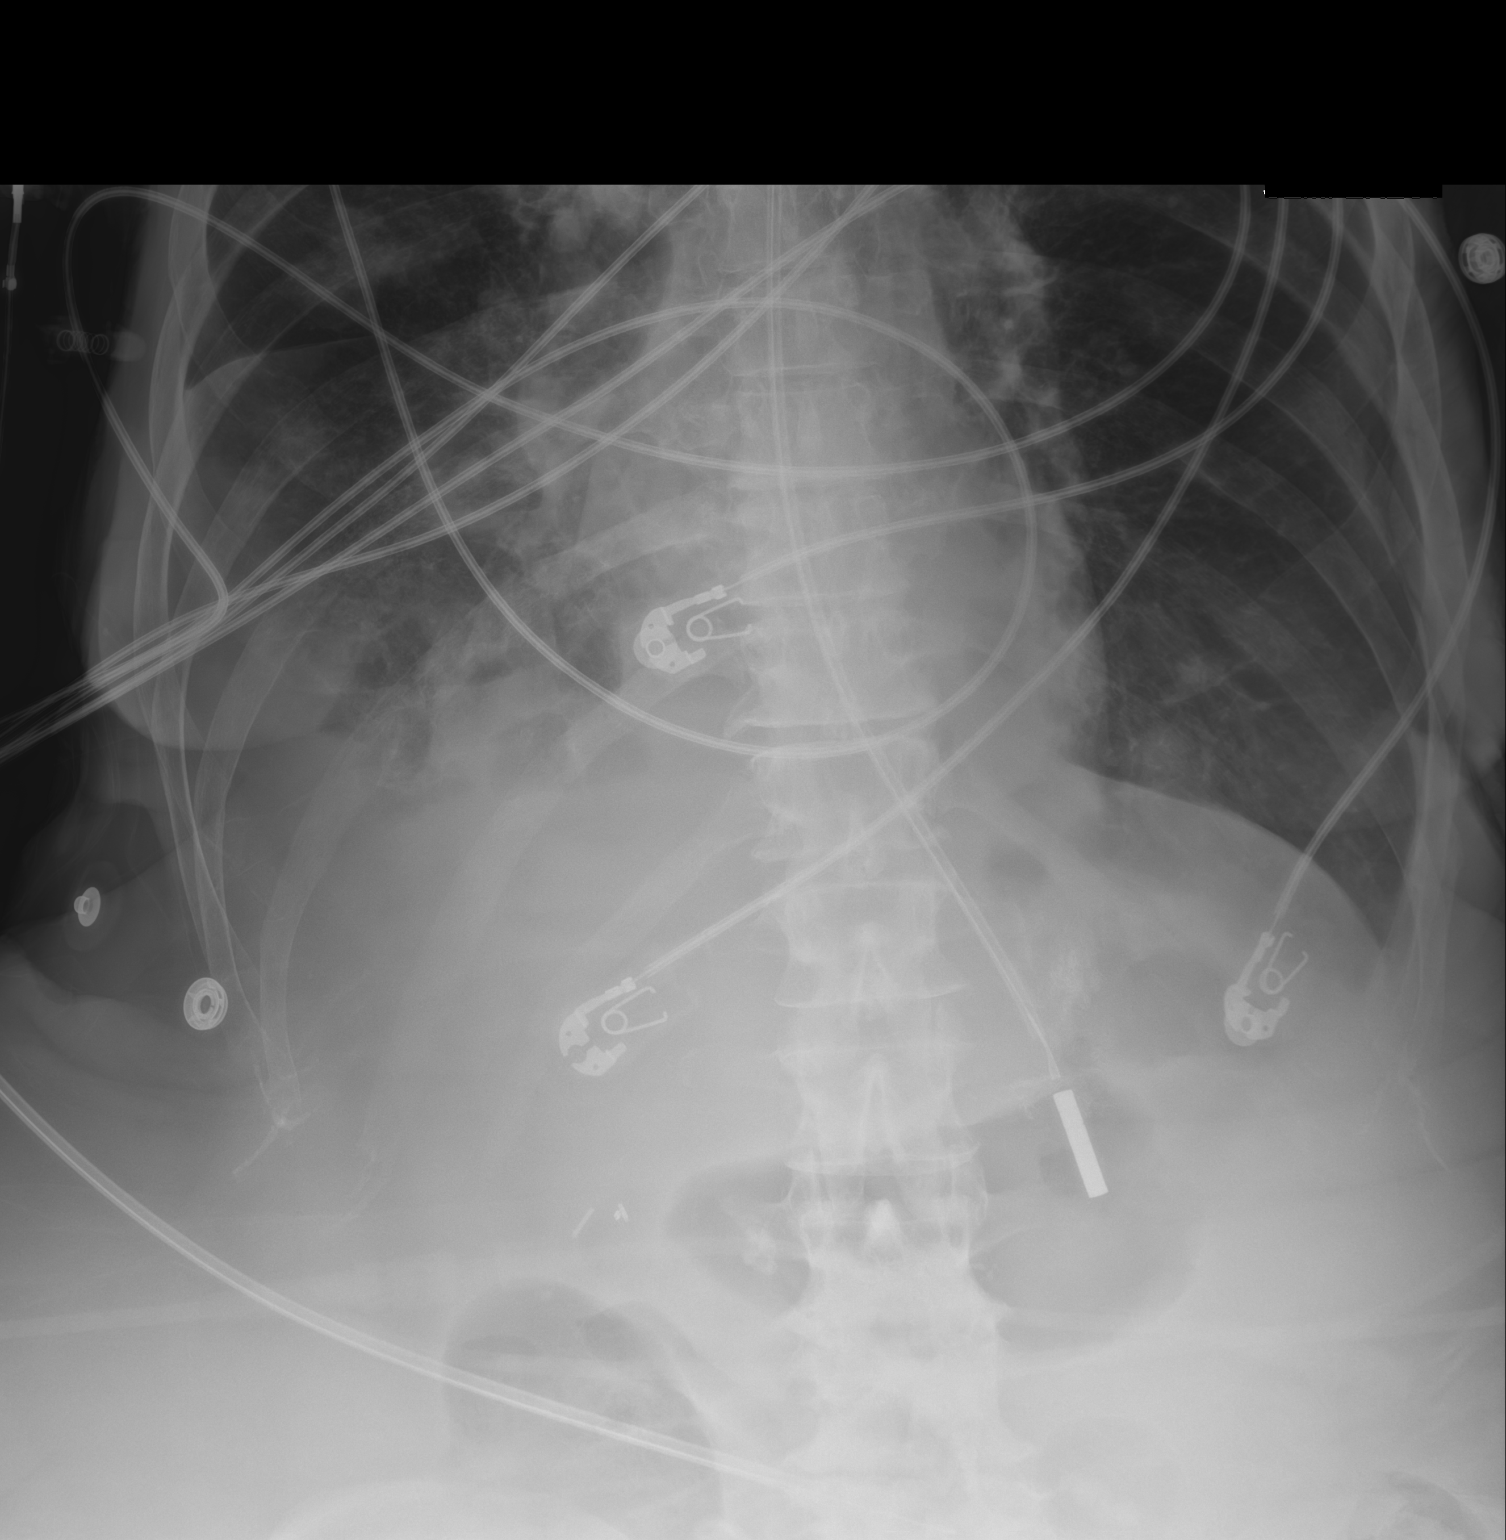

[1 of 1 positions shown; findings below may reference images not displayed]

FINDINGS: Limited radiograph of the lower chest and upper abdomen was obtained
for the purposes of enteric tube localization. Weighted enteric tube
is seen coursing below the diaphragm with distal tip terminating
within the expected location of the gastric body.
IMPRESSION: Enteric tube positioned within the gastric body.

## 2022-05-03 IMAGING — DX DG CHEST 1V PORT
1 series · 1 of 1 positions shown · non-contrast
Comparison: 09/19/2021

CLINICAL DATA: Status post intubation

EXAM:
PORTABLE CHEST 1 VIEW

[chest ap]
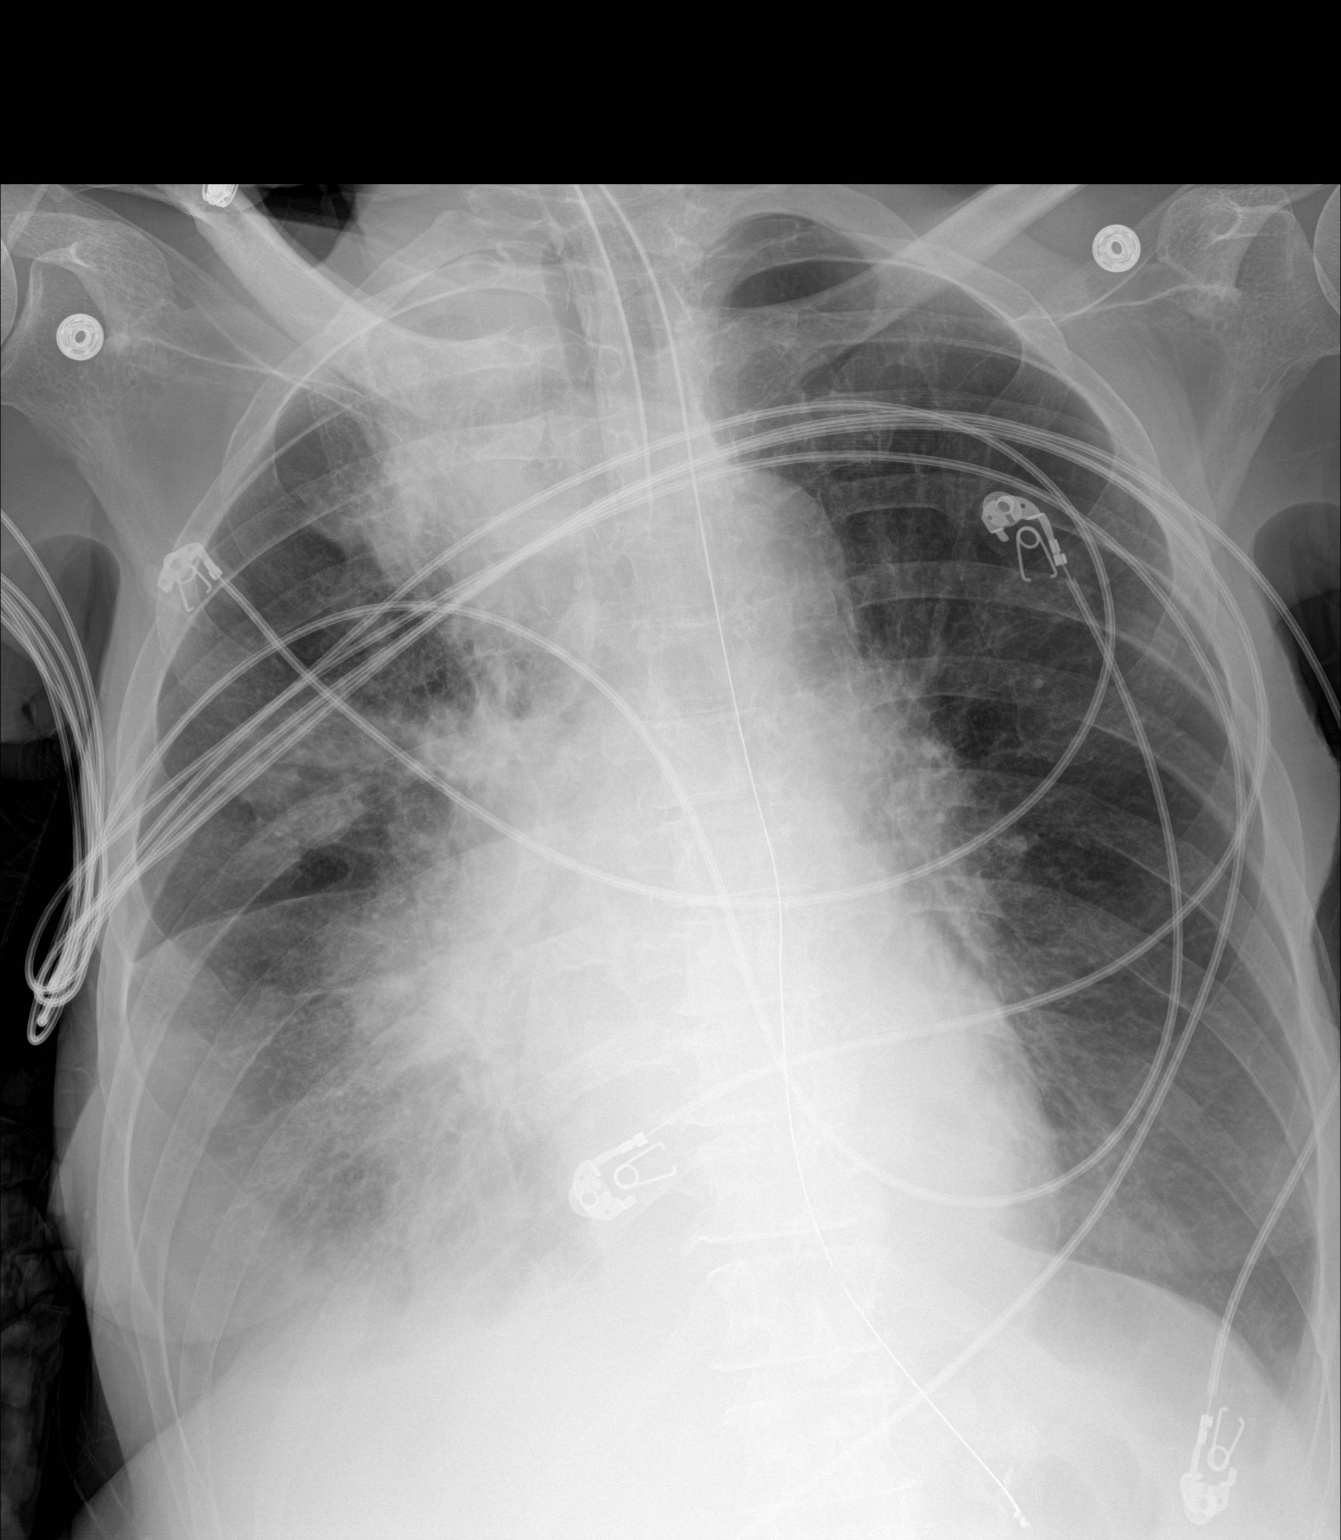

[1 of 1 positions shown; findings below may reference images not displayed]

FINDINGS: Cardiac shadow is stable. Endotracheal tube is noted in satisfactory
position. Gastric catheter extends into the stomach although the
proximal side port lies in the distal esophagus. This should be
advanced several cm deeper into the stomach. Patchy airspace opacity
is again identified throughout the right lung similar to that seen
on prior exam and prior CT. Left lung remains clear. No bony
abnormality is noted.
IMPRESSION: Tubes and lines in satisfactory position.

Persistent patchy infiltrate throughout the right lung.

## 2022-05-03 IMAGING — DX DG ABDOMEN 1V
1 series · 1 of 1 positions shown · non-contrast
Comparison: September 20, 2021.

CLINICAL DATA: NG tube placement.  Post advancement.

EXAM:
ABDOMEN - 1 VIEW

[abdomen kub]
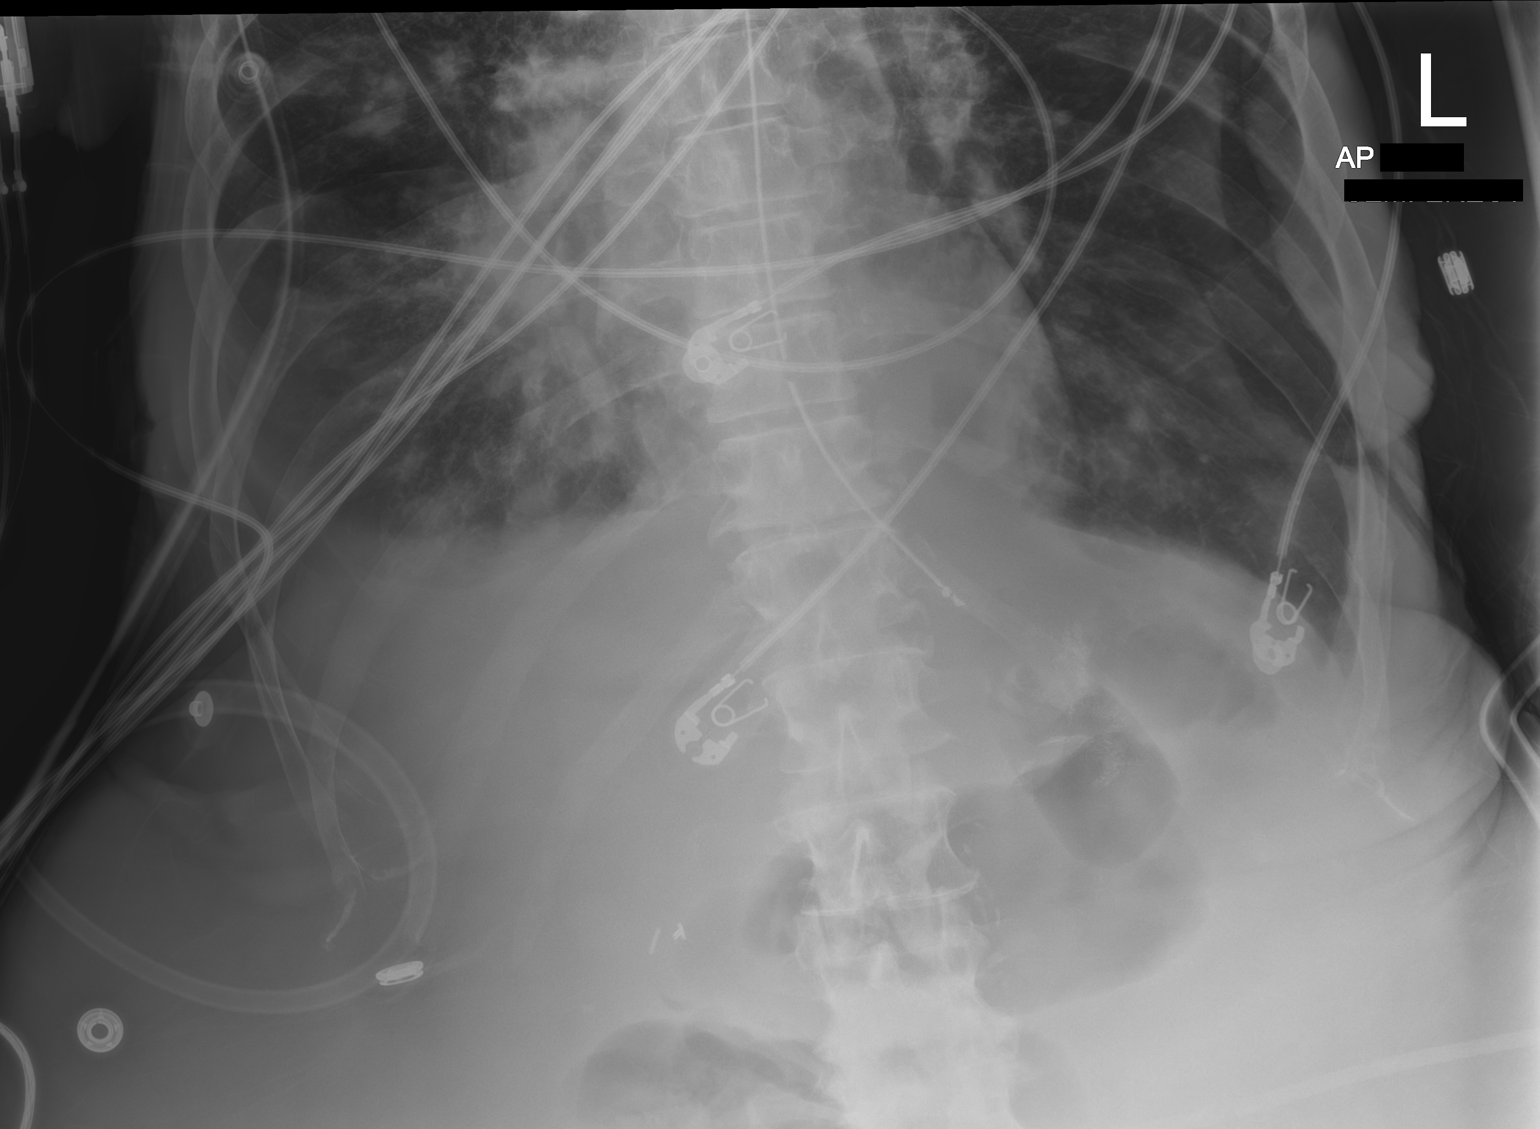

[1 of 1 positions shown; findings below may reference images not displayed]

FINDINGS: RIGHT-sided effusion and basilar airspace disease as before.

Gastric tube in place is slightly more proximal when compared to the
prior study. Side port above GE junction.

Limited assessment of bowel gas pattern is stable.

EKG leads project over the upper abdomen and lower chest.

On limited assessment there is no acute skeletal process.
IMPRESSION: 1. Gastric tube is slightly more proximal when compared to the prior
study. Side port above GE junction. This could be advanced based on
tip position approximately 14 cm but should be correlated perhaps
with radiograph of the chest or neck to ensure no substantial
coiling or redundancy in these areas based on history of interval
advancement.
2. RIGHT-sided effusion and basilar airspace disease as before.

These results will be called to the ordering clinician or
representative by the Radiologist Assistant, and communication
documented in the PACS or [REDACTED].

## 2022-05-03 IMAGING — DX DG ABDOMEN 1V
1 series · 1 of 1 positions shown · non-contrast
Comparison: None.

CLINICAL DATA: Check NG placement

EXAM:
ABDOMEN - 1 VIEW

[chest ap]
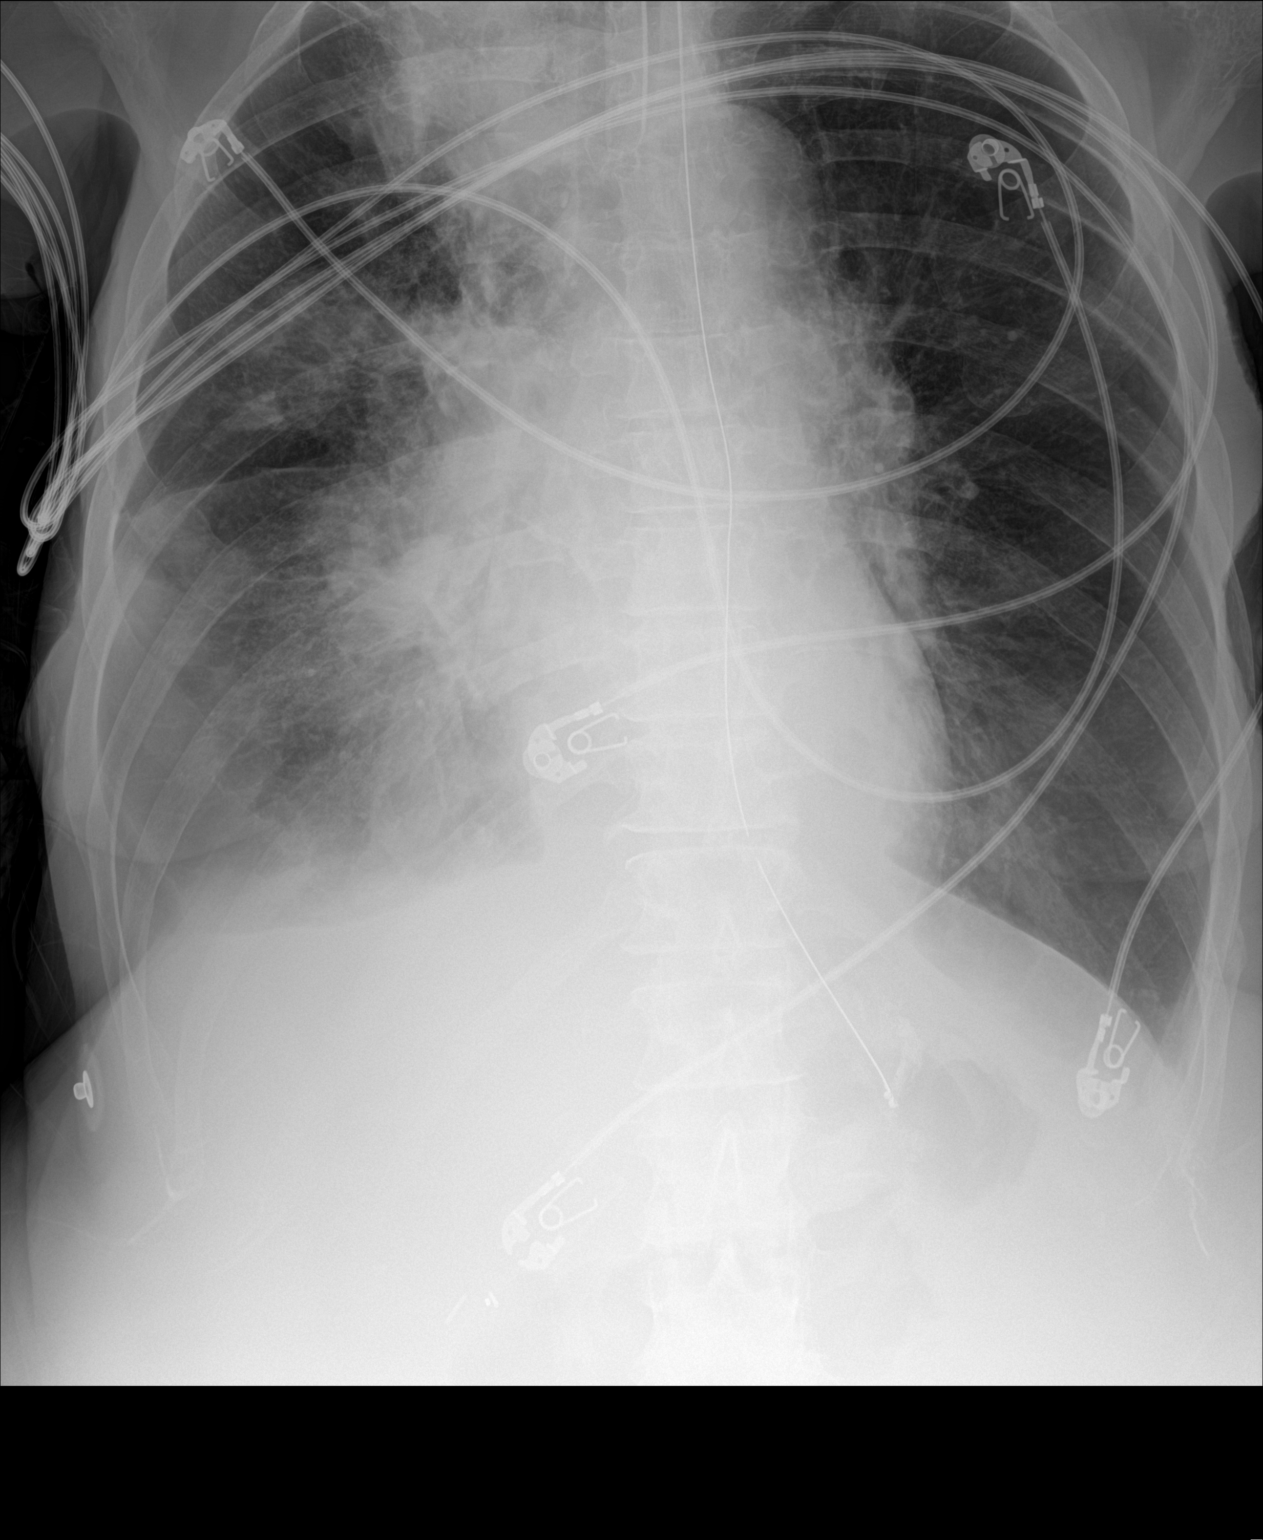

[1 of 1 positions shown; findings below may reference images not displayed]

FINDINGS: Gastric catheter extends with the tip in the stomach although the
proximal side port lies in the distal esophagus. This should be
advanced several cm deeper into the stomach. Patchy infiltrates are
again noted in the right lung.
IMPRESSION: Gastric catheter as described. This should be advanced deeper into
the stomach.

## 2022-05-05 IMAGING — DX DG ABDOMEN 1V
1 series · 1 of 1 positions shown · non-contrast
Comparison: 09/20/2021

CLINICAL DATA: NG tube placement

EXAM:
ABDOMEN - 1 VIEW

[abdomen kub]
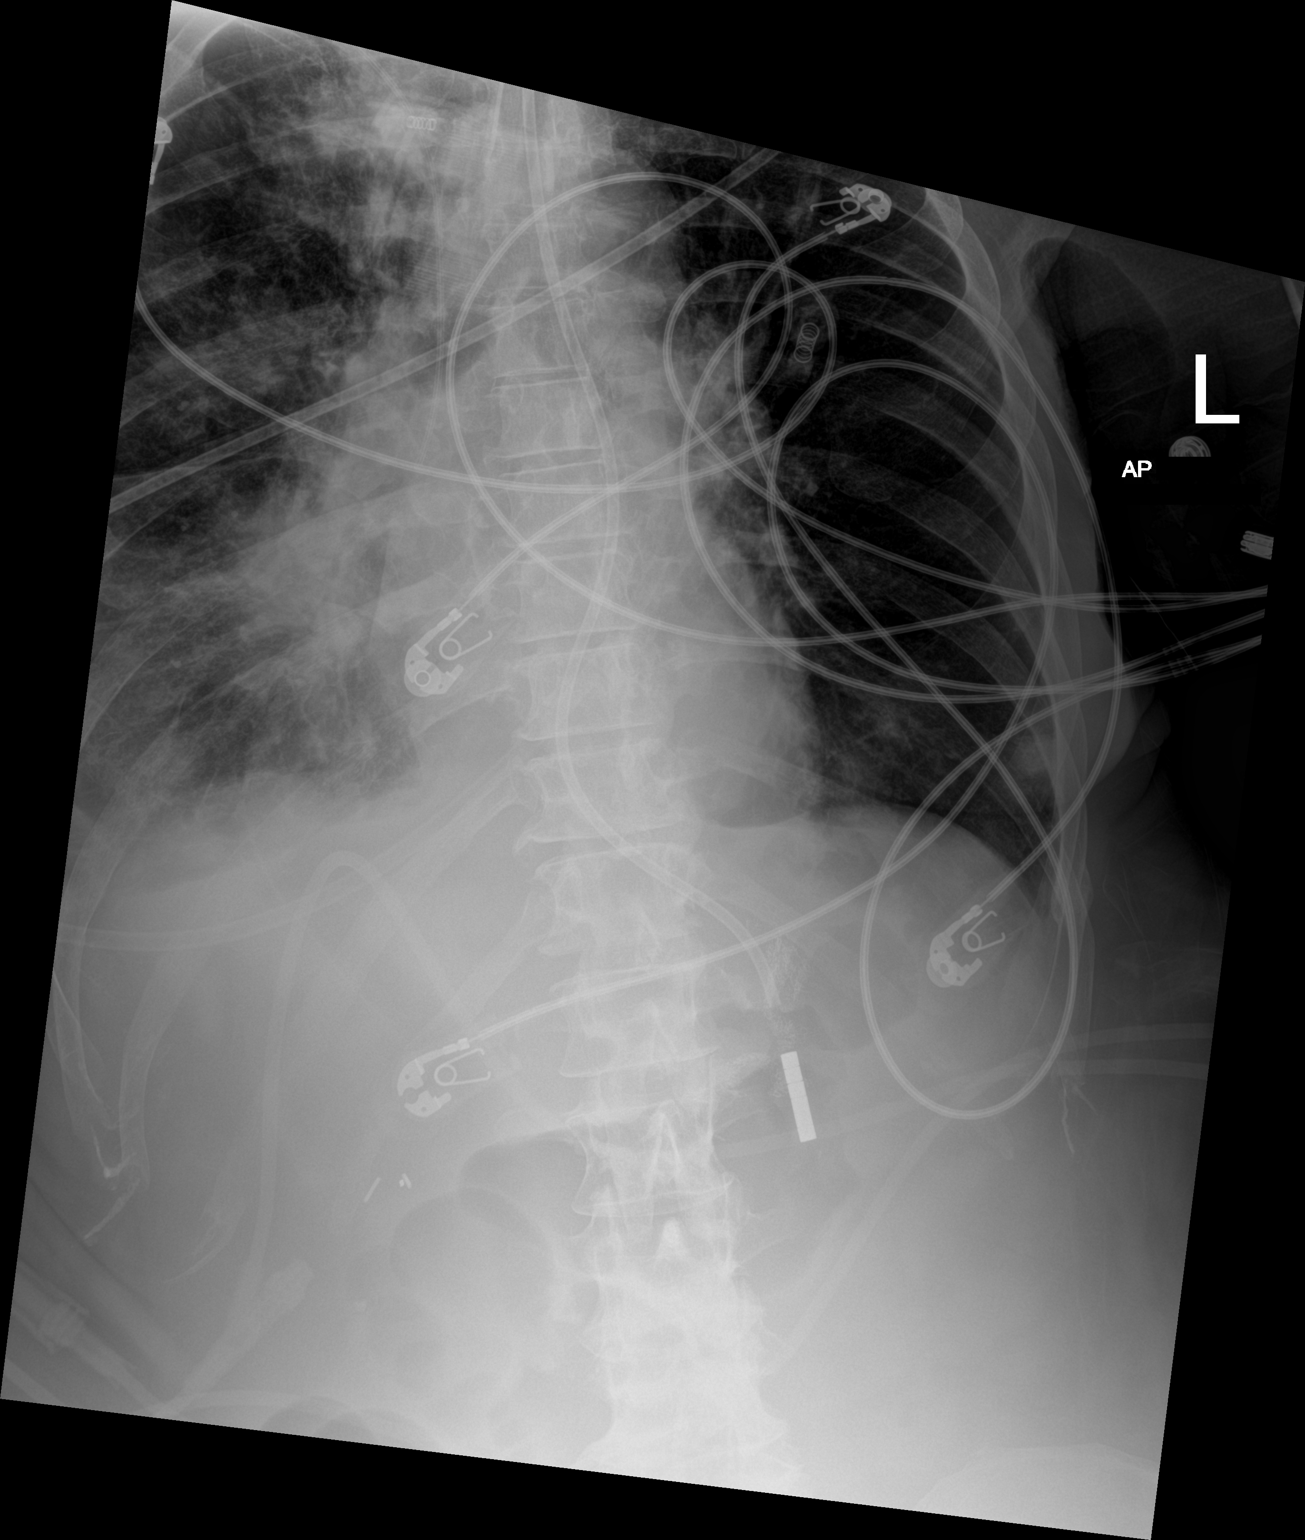

[1 of 1 positions shown; findings below may reference images not displayed]

FINDINGS: Dobbhoff tube tip located in the region of the proximal stomach,
similar to prior study.

Dense airspace opacity again seen in the right medial lung.

No dilated loops of bowel identified within the visualized abdomen.
IMPRESSION: Tip of feeding tube located in the region of the proximal stomach,
similar to prior study.

## 2022-05-05 IMAGING — DX DG CHEST 1V PORT
1 series · 1 of 1 positions shown · non-contrast
Comparison: 09/20/2021

CLINICAL DATA: ETT placement

EXAM:
PORTABLE CHEST 1 VIEW

[chest ap]
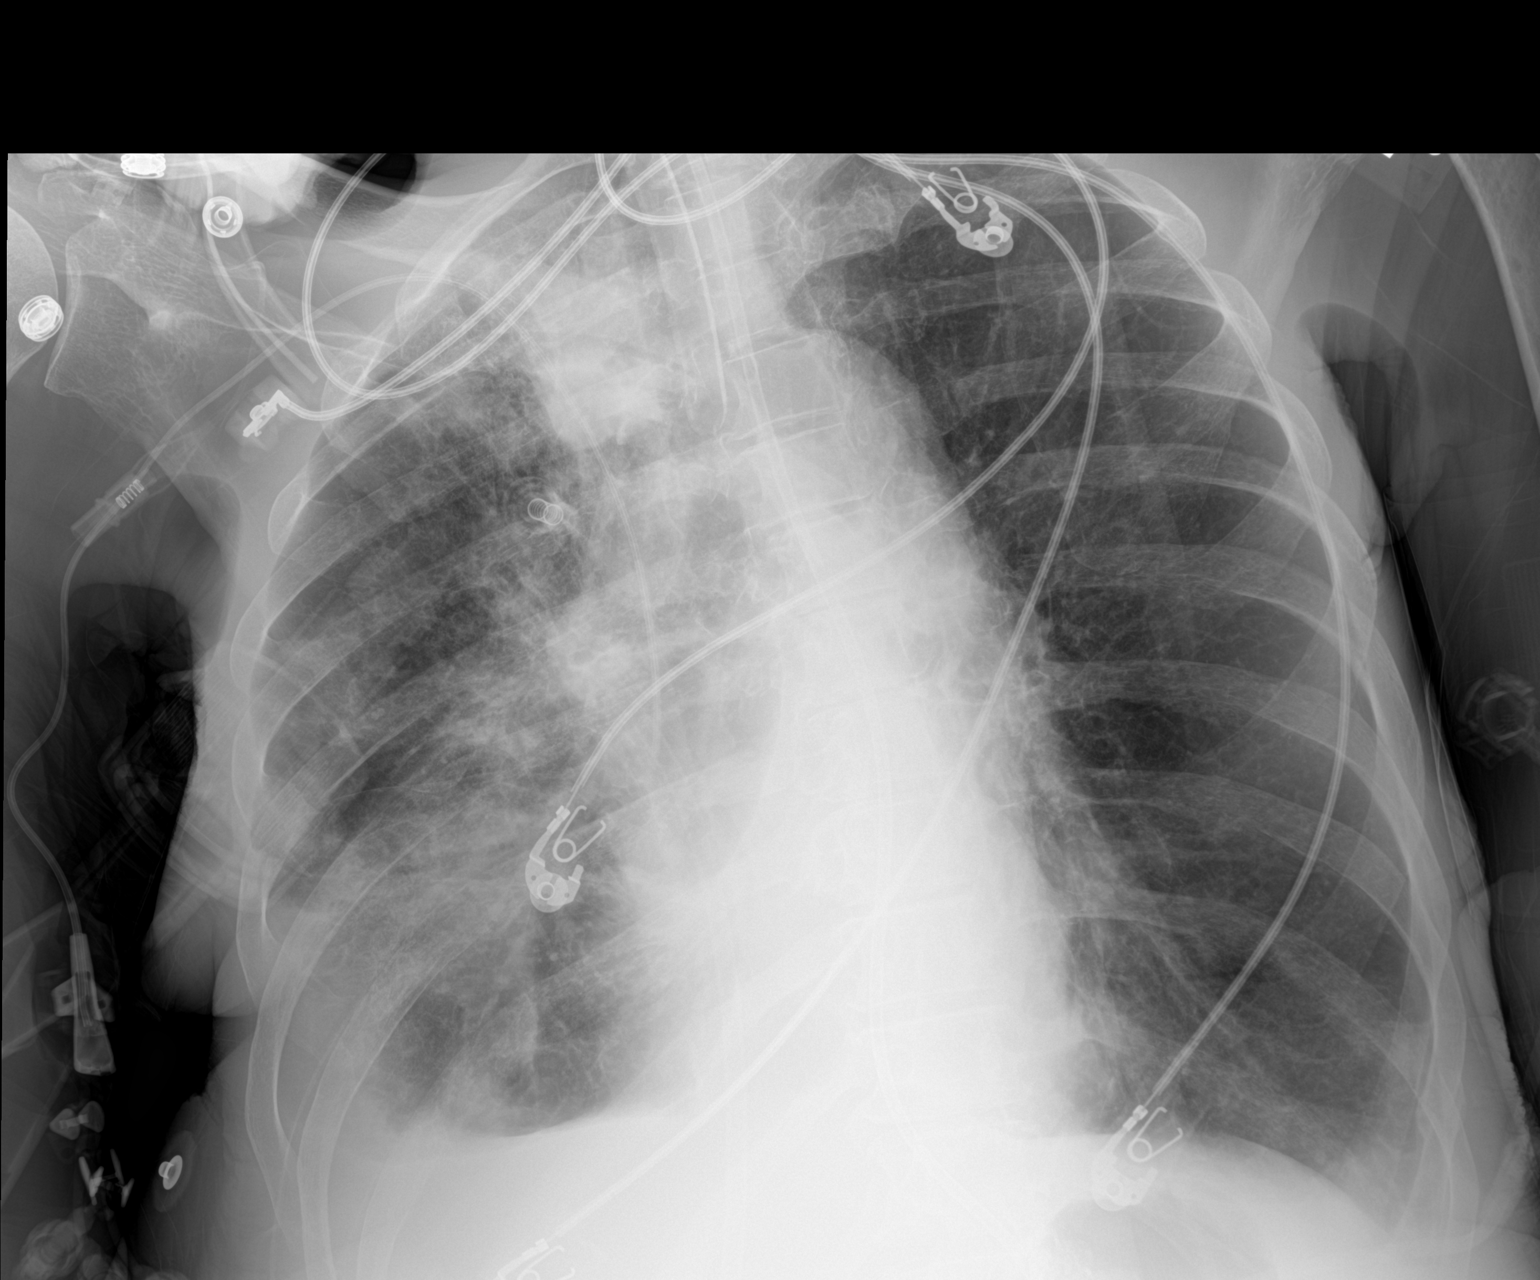

[1 of 1 positions shown; findings below may reference images not displayed]

FINDINGS: Significantly rotated.

Endotracheal tube is approximately 4 cm above the carina. Enteric
tube passes into the stomach. Right central line tip overlies SVC.

Persistent right pleural effusion. Some improvement in right basilar
aeration. No pneumothorax. Similar cardiomediastinal contours.
IMPRESSION: Persistent right pleural effusion. Right pulmonary opacities with
some improvement in basilar aeration since 09/20/2021.

## 2022-05-06 IMAGING — US US ABDOMEN LIMITED
1 series · 14 of 25 positions shown · non-contrast
Comparison: CT done on 09/18/2021

CLINICAL DATA: Ascites

EXAM:
ULTRASOUND ABDOMEN LIMITED RIGHT UPPER QUADRANT

[Series 2: us abdomen limited · 14 of 36 slices shown]
[im 1/36]
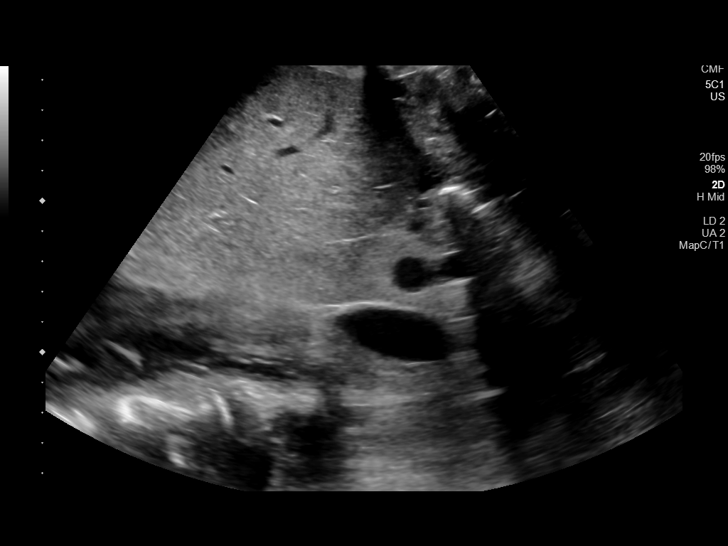
[im 3/36]
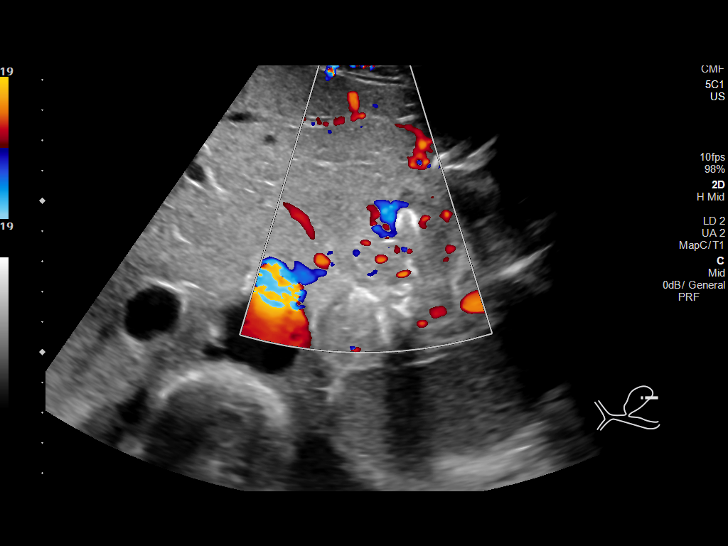
[im 6/36]
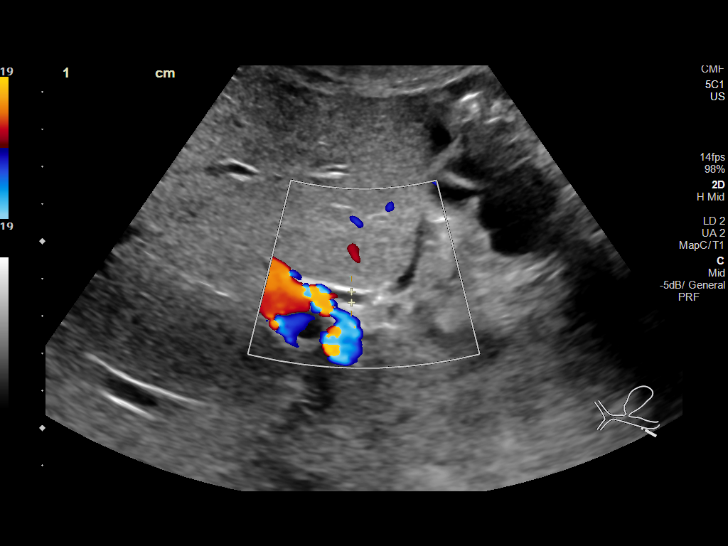
[im 9/36]
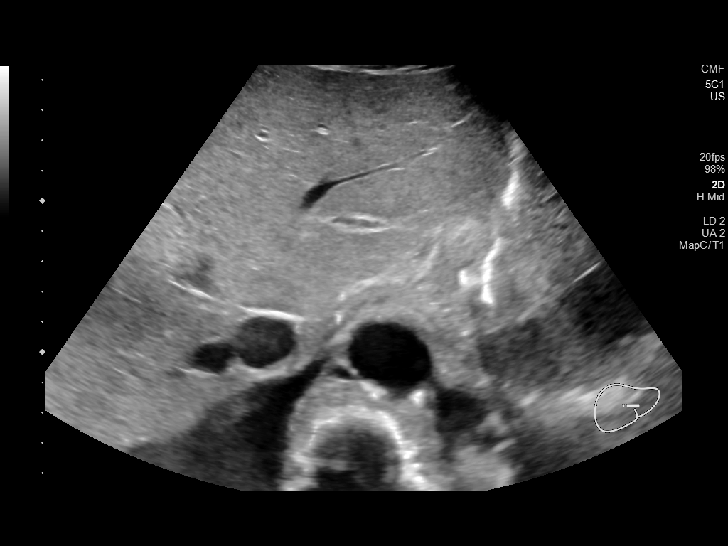
[im 12/36]
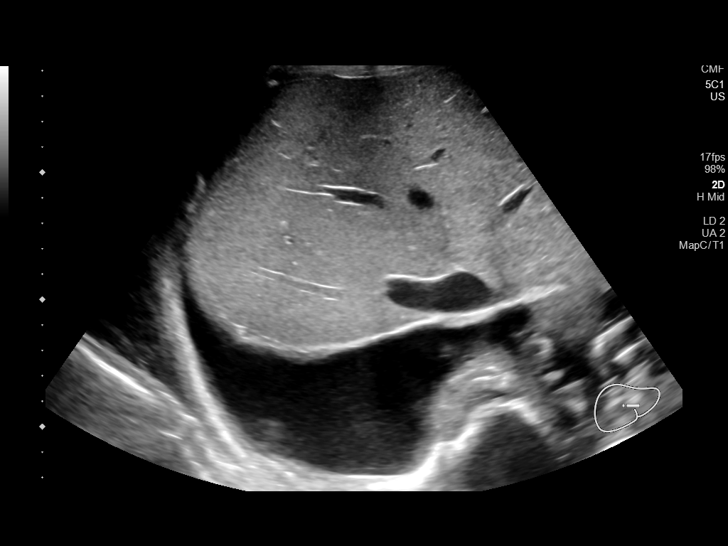
[im 14/36]
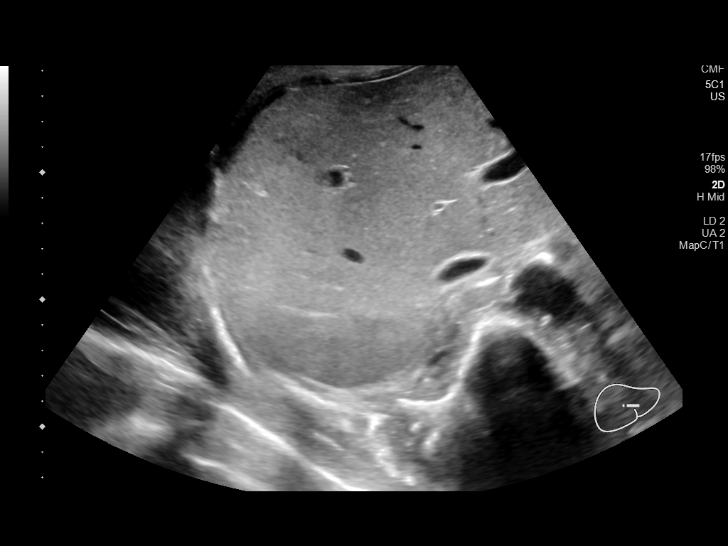
[im 17/36]
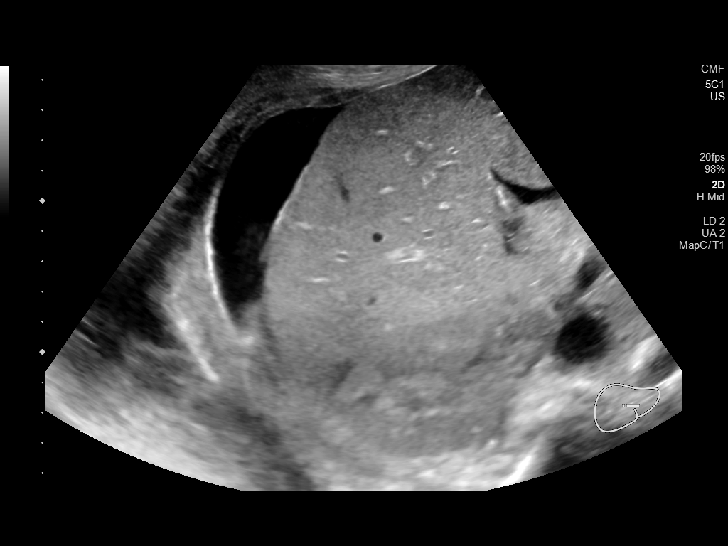
[im 19/36]
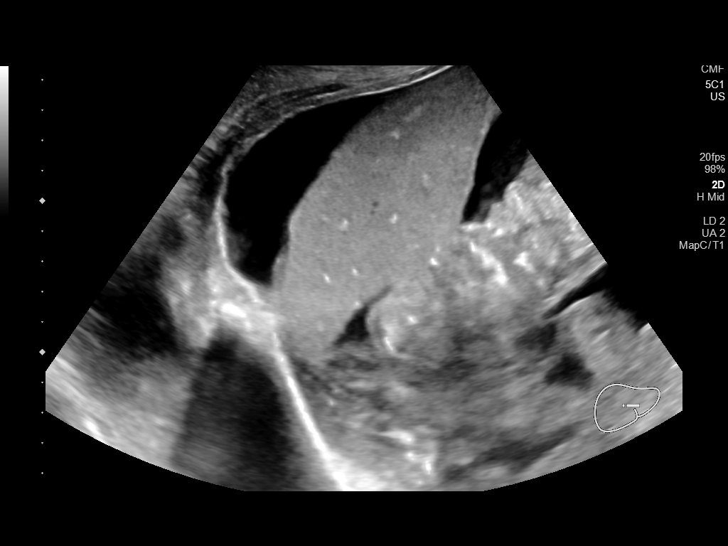
[im 22/36]
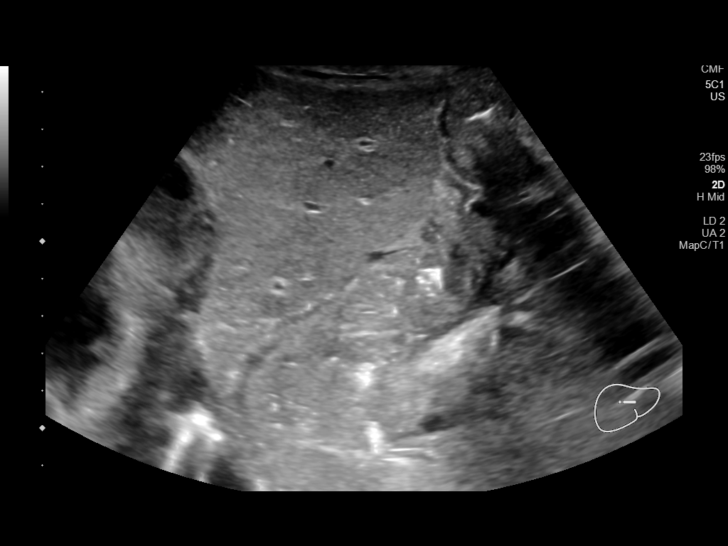
[im 24/36]
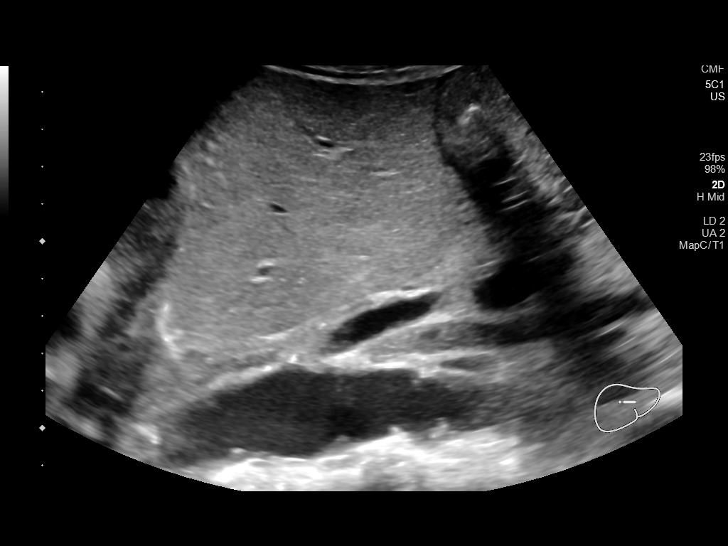
[im 27/36]
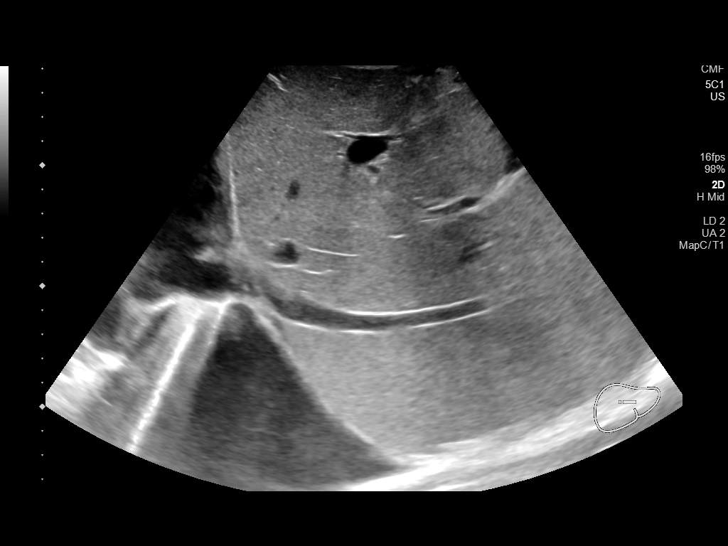
[im 30/36]
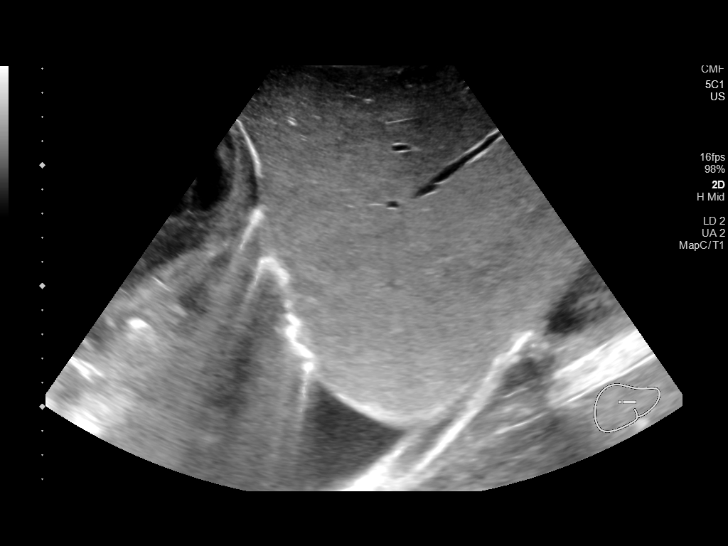
[im 33/36]
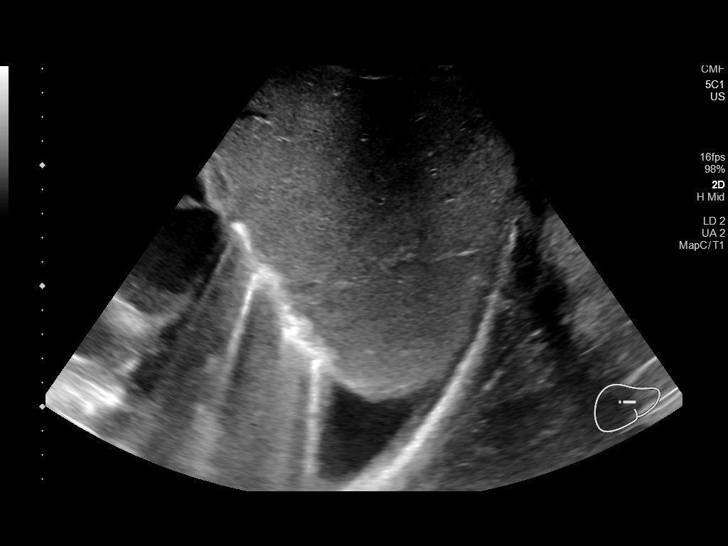
[im 36/36]
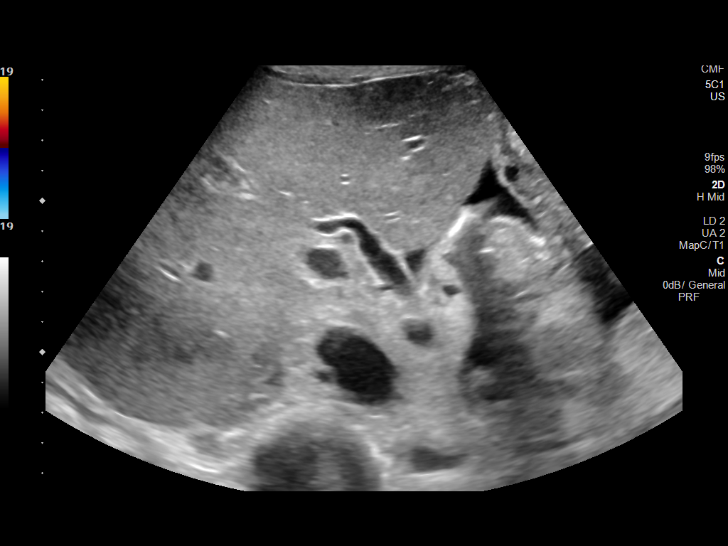

[14 of 25 positions shown; findings below may reference images not displayed]

FINDINGS: Gallbladder:

Gallbladder is not seen consistent with cholecystectomy

Common bile duct:

Diameter: 3 mm

Liver:

Space-occupying lesions seen in the liver in the CT done on
09/18/2021 are not visualized in the submitted images of liver.
Portal vein is patent on color Doppler imaging with normal direction
of blood flow towards the liver.

Other: There is evidence of large right pleural effusion. Small to
moderate ascites is noted adjacent to the liver. According to the
note by the technologist examination was technically difficult as
patient was unable to follow instructions for breath hold.
IMPRESSION: Status post cholecystectomy.  There is no dilation of bile ducts.

Large right pleural effusion.  Small to moderate ascites.

## 2022-05-07 IMAGING — DX DG CHEST 1V PORT
1 series · 2 of 2 positions shown · non-contrast
Comparison: 09/22/2021.

CLINICAL DATA: Ventilator dependent respiratory failure.

EXAM:
PORTABLE CHEST 1 VIEW

[Series 1: chest ap · 0.14mm/px · 2 of 2 slices shown]
[im 1/2]
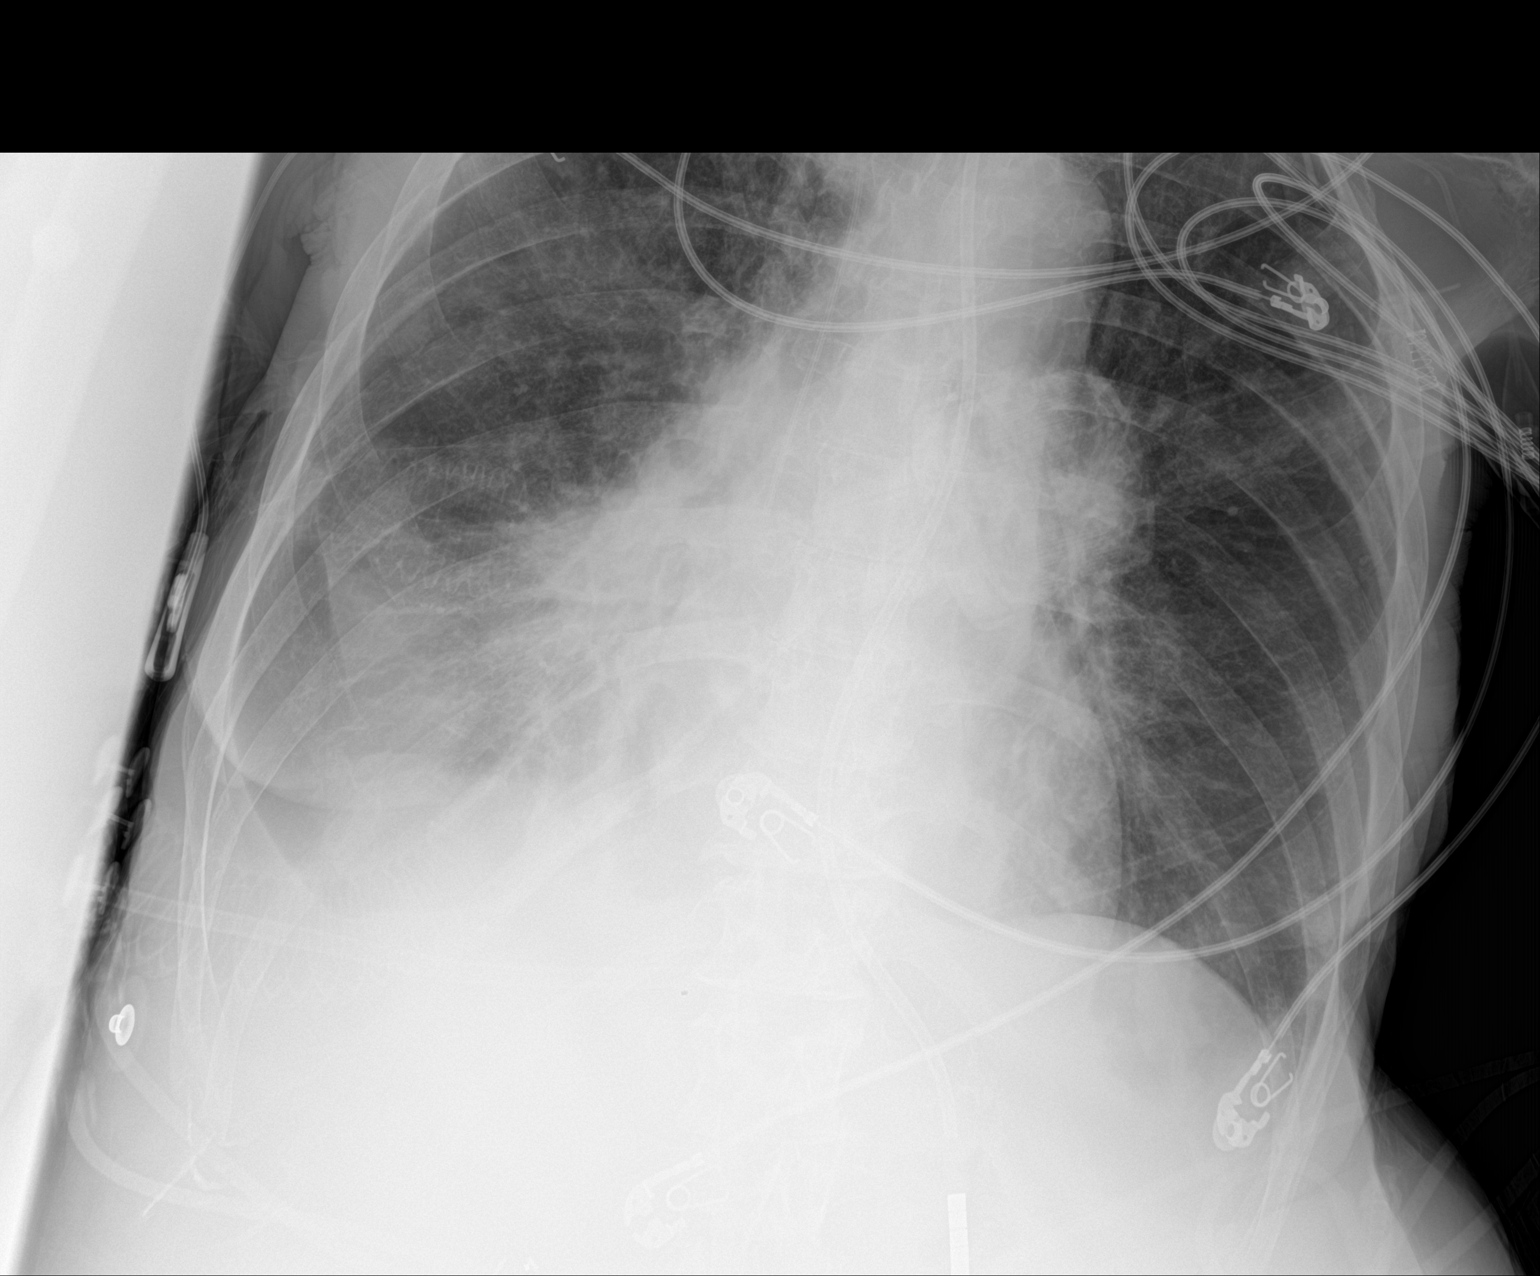
[im 2/2]
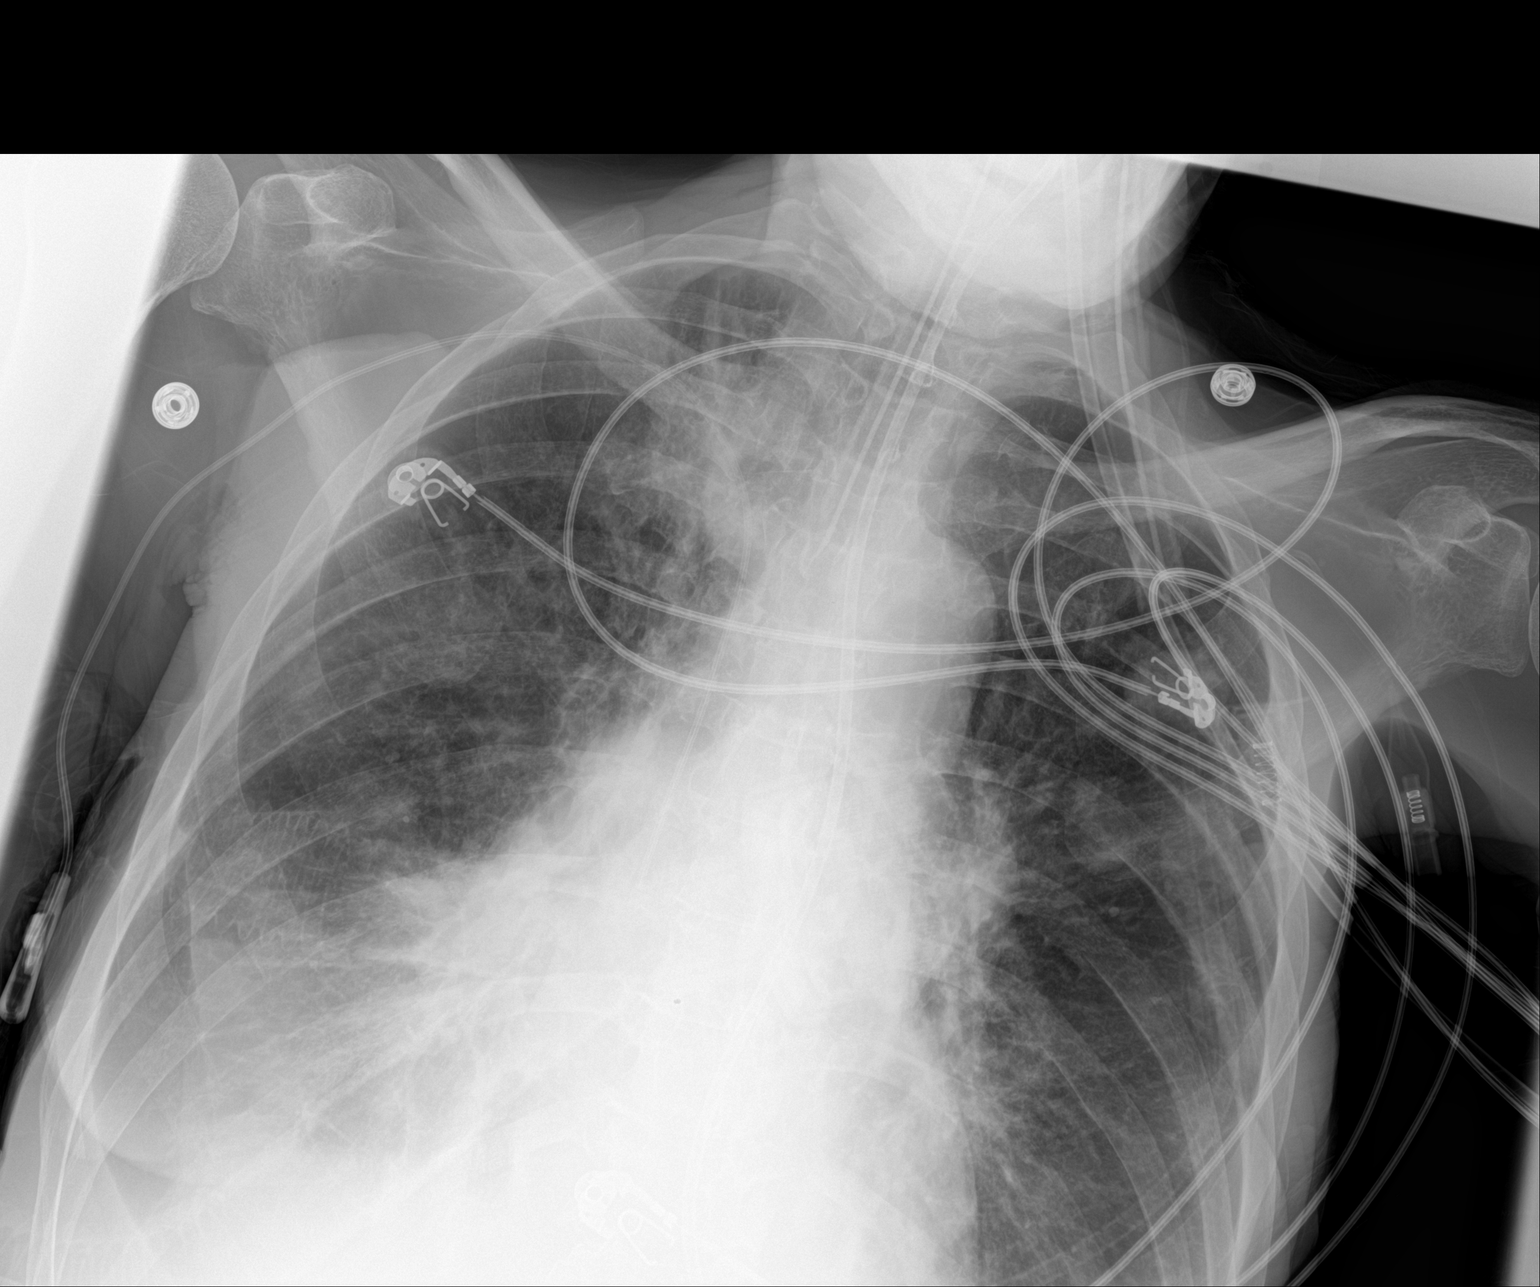

[2 of 2 positions shown; findings below may reference images not displayed]

FINDINGS: [DATE] a.m., 09/24/2021. ETT tip is 3.5 cm from the carina. Feeding
tube enters the stomach with radiopaque tip at the level of the
gastric body. Right PICC terminates in the distal SVC.

The cardiac size is normal. The mediastinum is unchanged with mild
aortic atherosclerosis.

Persistent moderate right and small left pleural effusions. Patchy
opacities of the right lung fields, greatest in the lower lung field
showing mild improvement today. On the left no focal infiltrate is
seen.
IMPRESSION: Improving right pulmonary opacities with no change in the
right-greater-than-left pleural effusions.

## 2022-05-08 IMAGING — DX DG CHEST 1V PORT
1 series · 2 of 2 positions shown · non-contrast
Comparison: September 24, 2021.

CLINICAL DATA: Endotracheal tube position.

EXAM:
PORTABLE CHEST 1 VIEW

[Series 1: chest ap · 0.14mm/px · 2 of 2 slices shown]
[im 1/2]
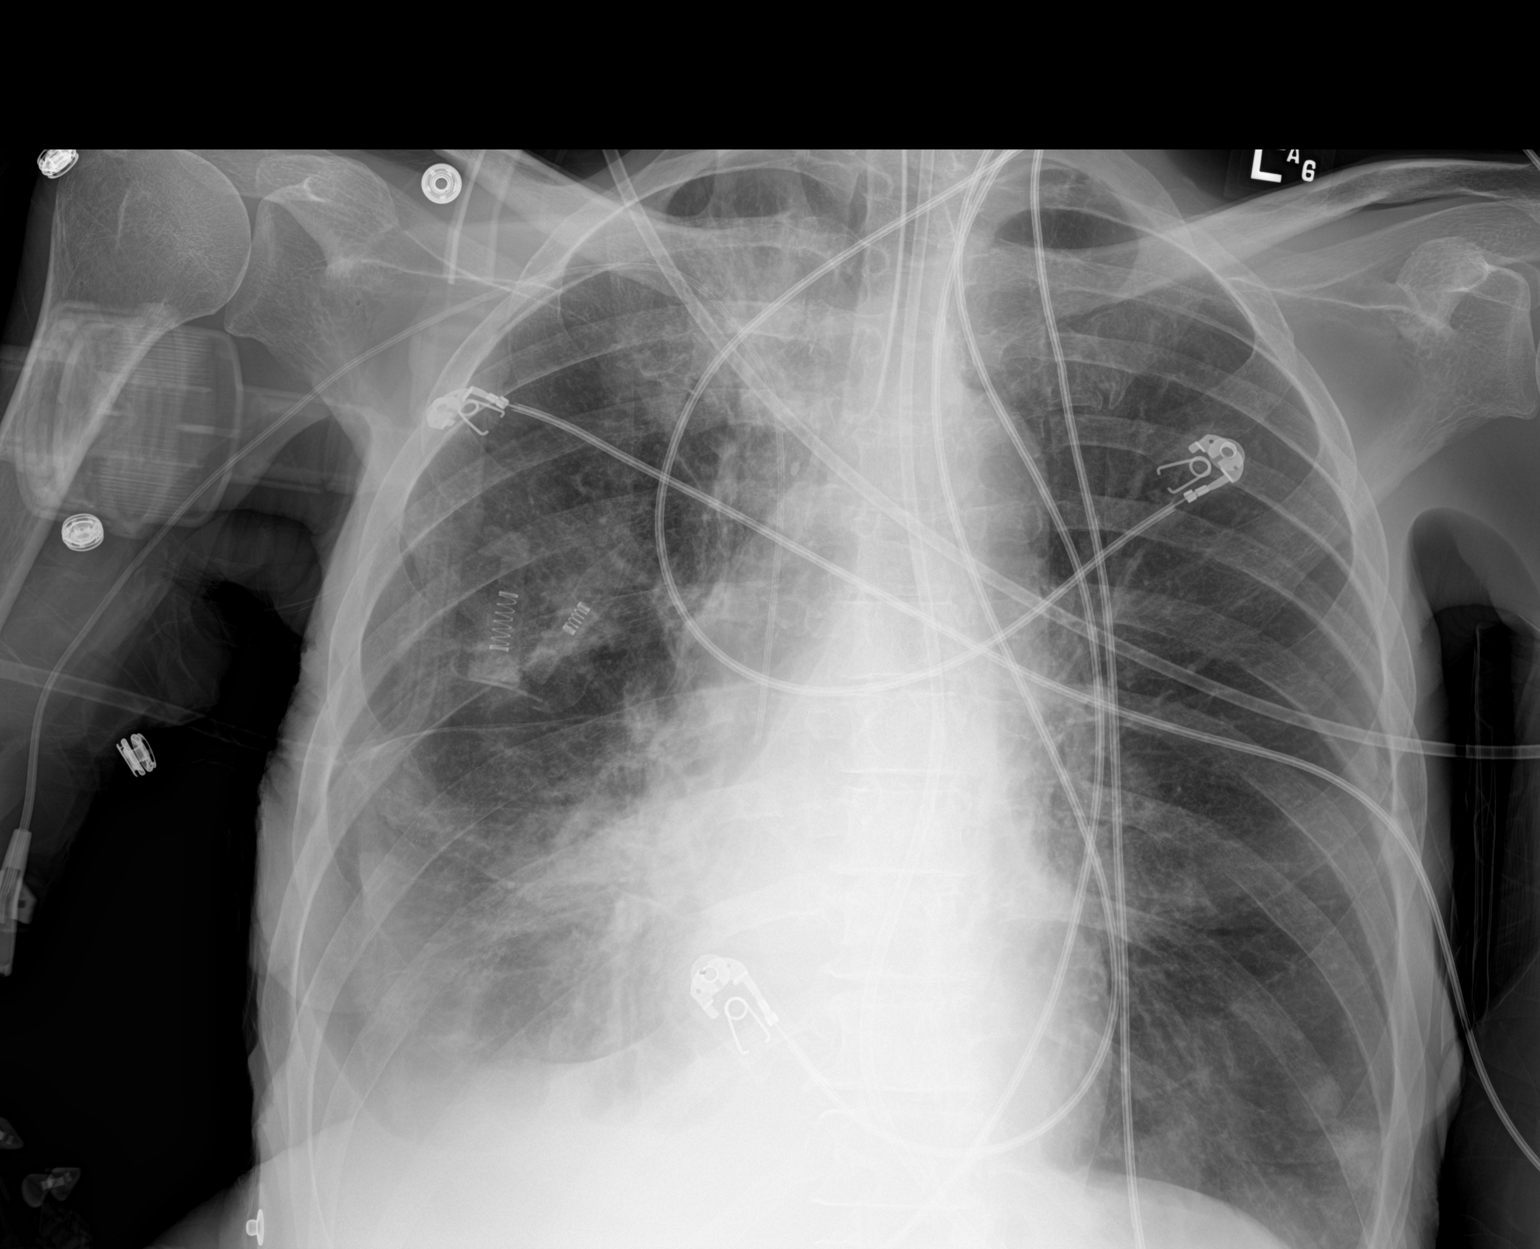
[im 2/2]
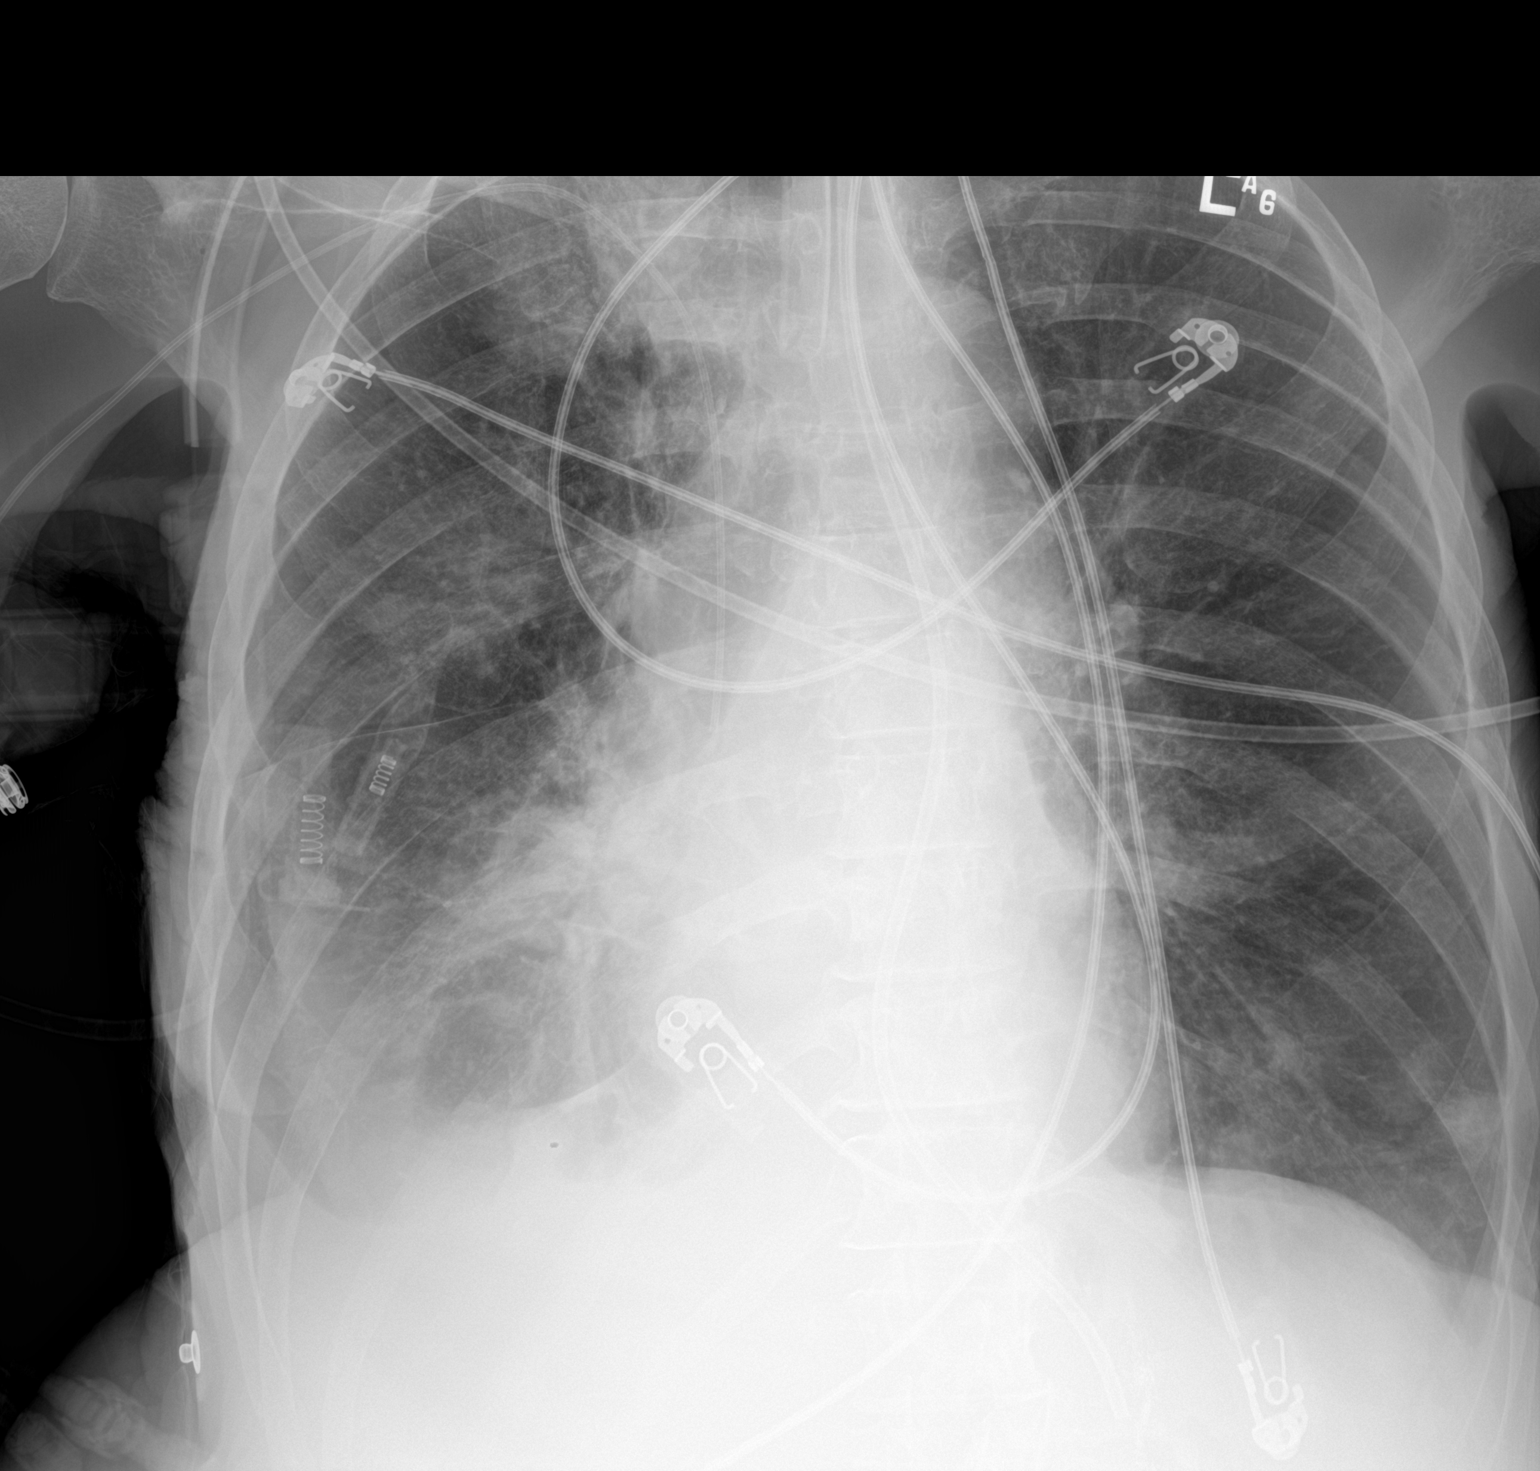

[2 of 2 positions shown; findings below may reference images not displayed]

FINDINGS: The heart size and mediastinal contours are within normal limits.
Endotracheal and feeding tubes are unchanged in position.
Right-sided PICC line is unchanged. Left lung is clear. Stable right
basilar atelectasis or infiltrate is noted with associated pleural
effusion. The visualized skeletal structures are unremarkable.
IMPRESSION: Stable support apparatus.  Stable right basilar opacity.

## 2022-05-09 IMAGING — DX DG CHEST 1V PORT
1 series · 1 of 1 positions shown · non-contrast
Comparison: Chest x-ray dated September 25, 2021

CLINICAL DATA: Acute respiratory failure

EXAM:
PORTABLE CHEST 1 VIEW

[chest ap]
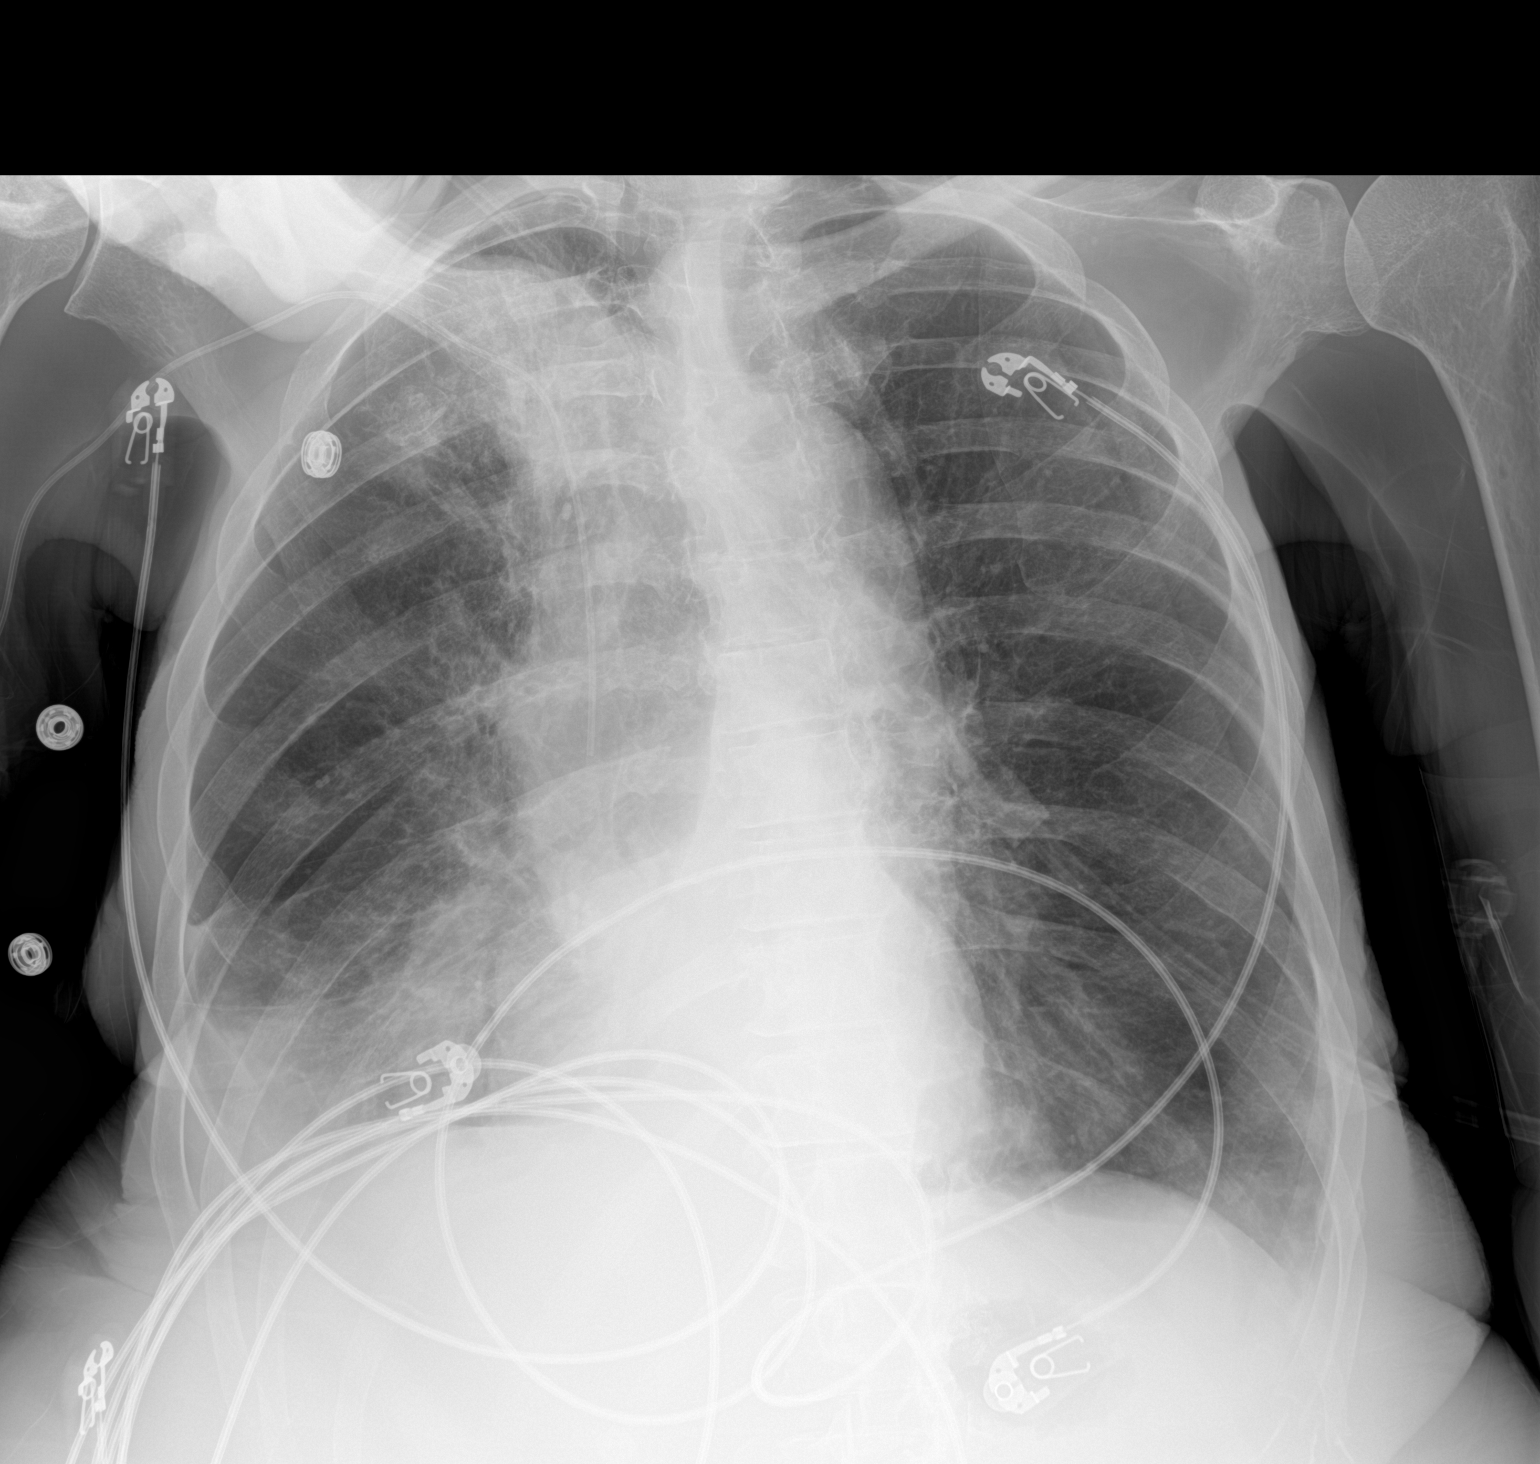

[1 of 1 positions shown; findings below may reference images not displayed]

FINDINGS: Interval removal of ET tube and feeding tube.

Cardiac and mediastinal contours are within normal limits. Unchanged
right apical and basilar consolidations. Stable small right pleural
effusion. No evidence of pneumothorax.
IMPRESSION: 1. Interval removal of ET tube and feeding tube.
2. Unchanged consolidations of the right hemithorax and small right
pleural effusion.

## 2022-05-10 IMAGING — DX DG CHEST 1V PORT
1 series · 1 of 1 positions shown · non-contrast
Comparison: September 26, 2021

CLINICAL DATA: Acute respiratory failure

EXAM:
PORTABLE CHEST 1 VIEW

[chest ap]
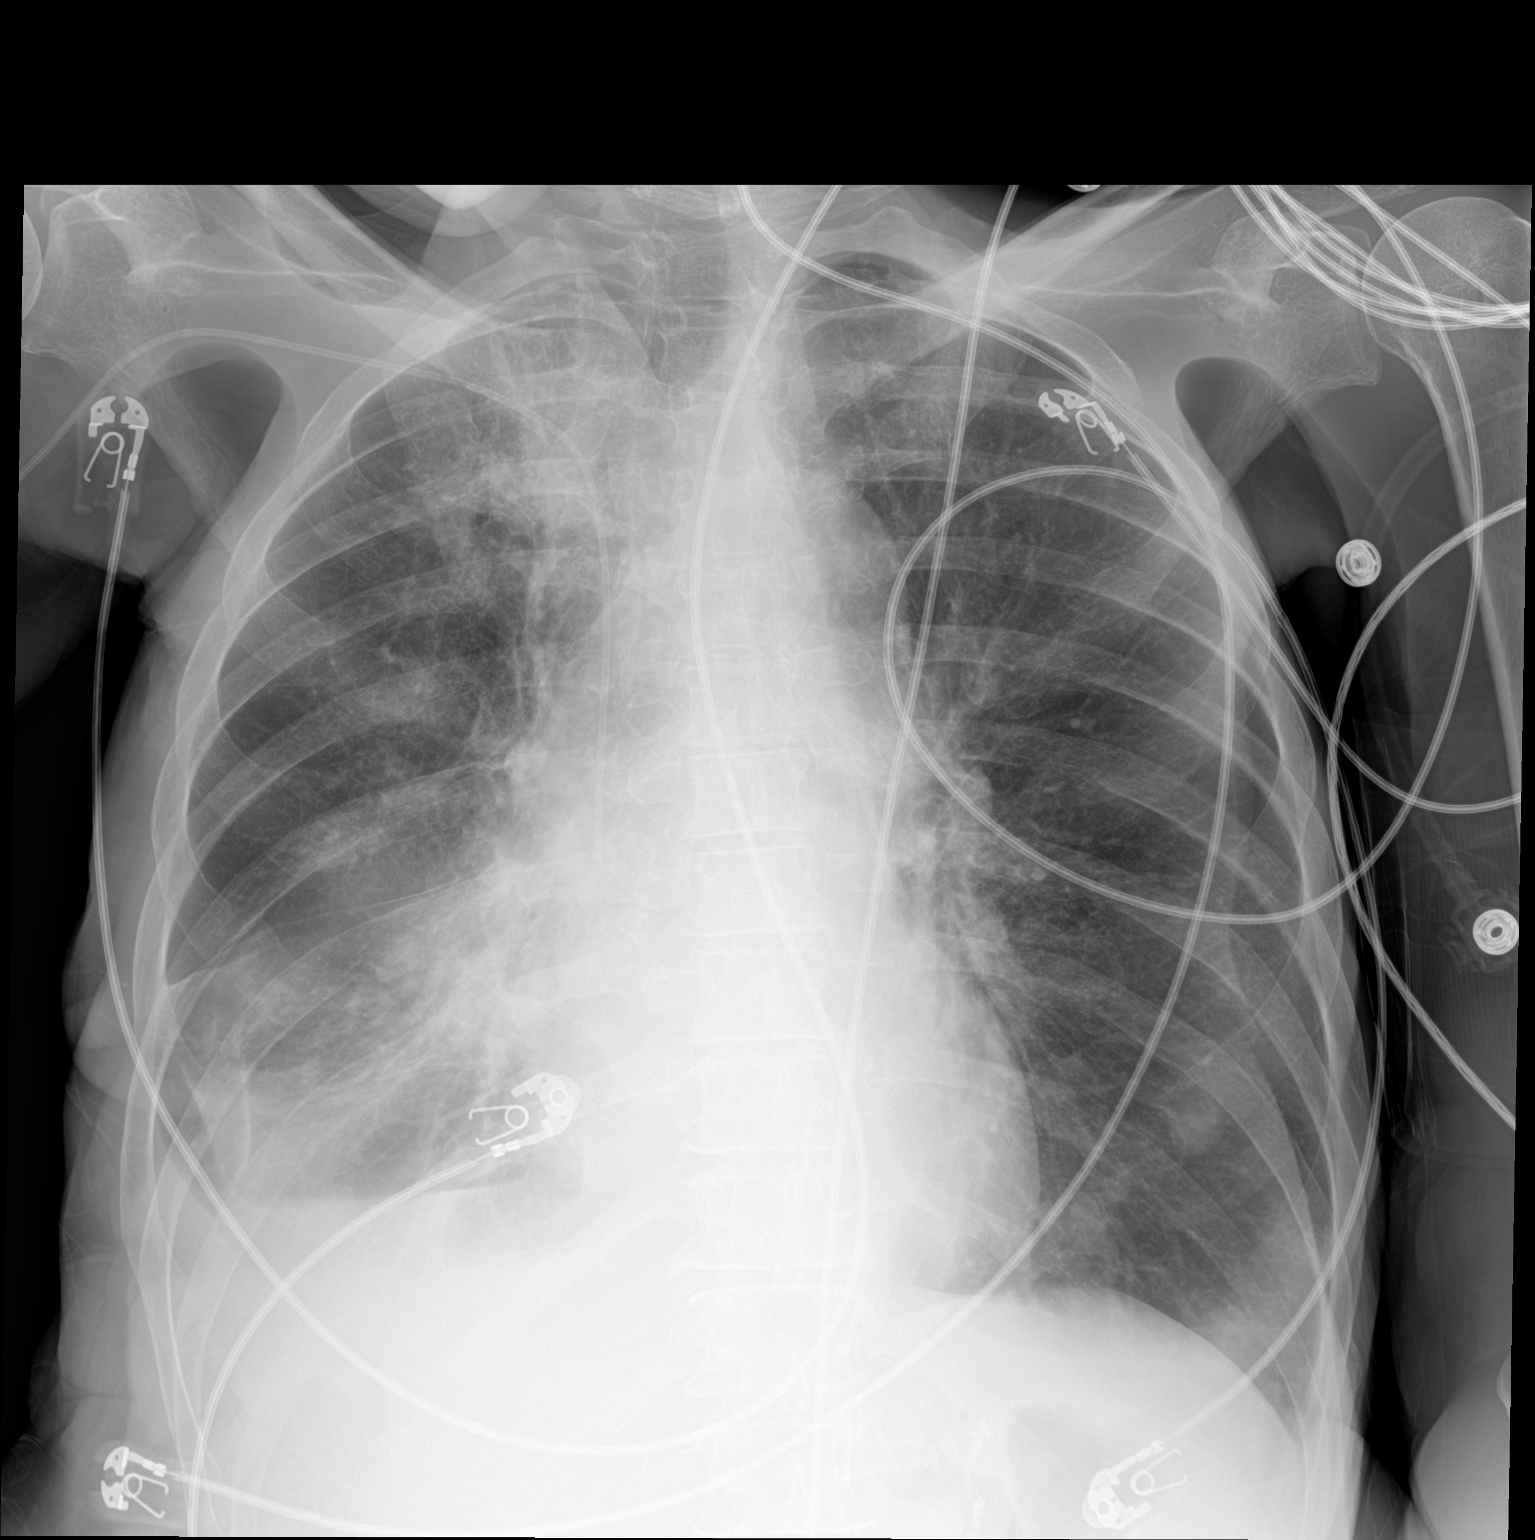

[1 of 1 positions shown; findings below may reference images not displayed]

FINDINGS: A right PICC line remains in good position. No pneumothorax. A
nipple shadow projects over the left base. Mild infiltrate in the
lateral left base is stable. Effusion and underlying opacity on the
right is similar to mildly worsened. Mild right perihilar opacity is
worsened. The cardiomediastinal silhouette is stable.
IMPRESSION: 1. Stable right PICC line.
2. Stable infiltrate in the lateral left lung base.
3. Small layering right effusion with underlying opacity is stable
to mildly more prominent. There is new right perihilar opacity
compared to the previous study.

## 2022-05-10 IMAGING — DX DG ABD PORTABLE 1V
1 series · 1 of 1 positions shown · non-contrast
Comparison: September 22, 2021

CLINICAL DATA: NG tube placement

EXAM:
PORTABLE ABDOMEN - 1 VIEW

[abdomen kub]
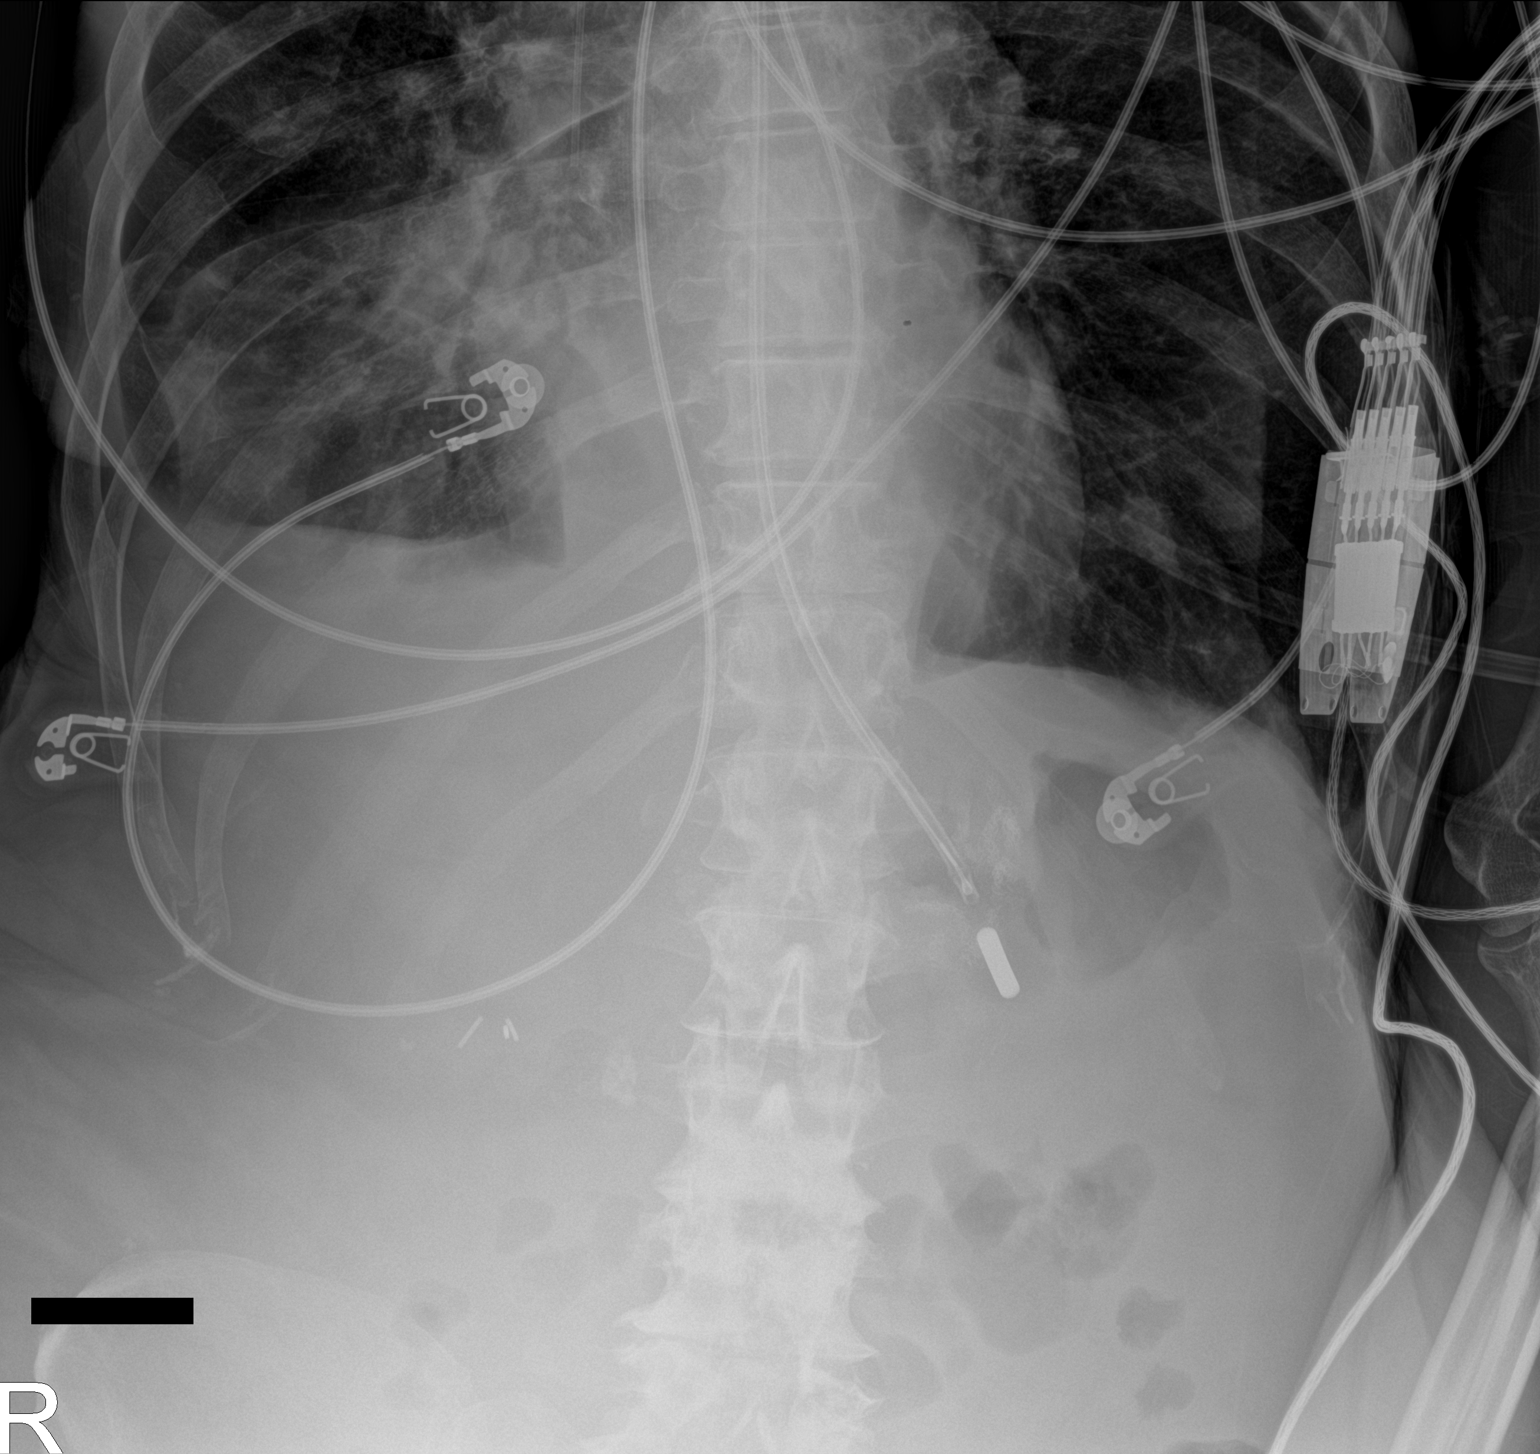

[1 of 1 positions shown; findings below may reference images not displayed]

FINDINGS: The distal tip of the feeding tube is in the left upper quadrant in
the region of the proximal stomach. Surgical staples are identified
in this region. Opacity in the right base is stable. No other
abnormalities or changes.
IMPRESSION: 1. The distal tip of the feeding tube is located in the left upper
quadrant, in the region the proximal stomach, unchanged.
2. Right basilar opacity is stable within visualized limits.

## 2022-05-11 IMAGING — RF DG NASO G TUBE PLC W/FL W/RAD
2 series · 2 of 2 positions shown · IV contrast (agent unspecified)
Comparison: Abdominal x-ray from yesterday.

CLINICAL DATA: Malnutrition.  Feeding tube advancement requested.

EXAM:
NASO G TUBE PLACEMENT WITH FL AND WITH RAD
CONTRAST:  None.
FLUOROSCOPY:
Radiation Exposure Index (as provided by the fluoroscopic device):
13.2 mGy Kerma

[Series 5: fluoro_iodine_singleshot_bw · 1 of 1 slices shown]
[im 1/1]
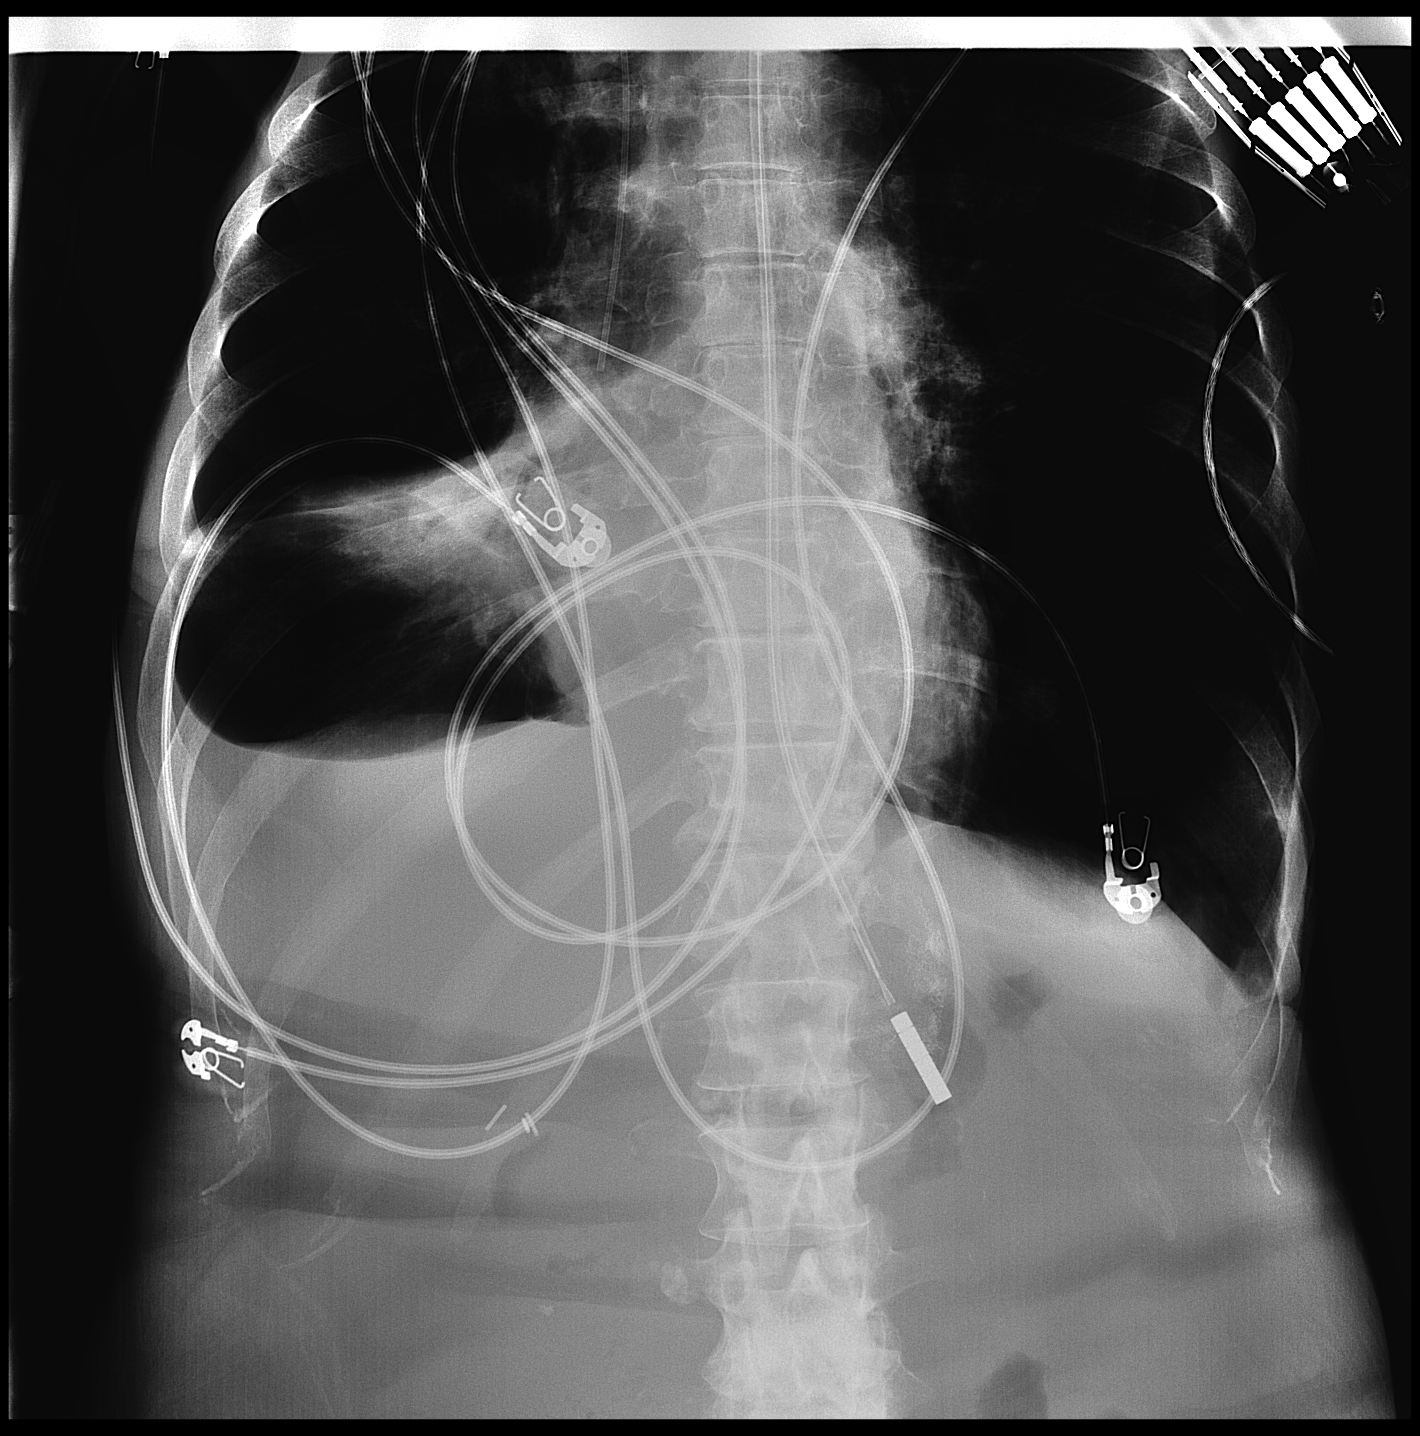

[Series 6: cp_standard · 1 of 1 slices shown]
[im 1/1]
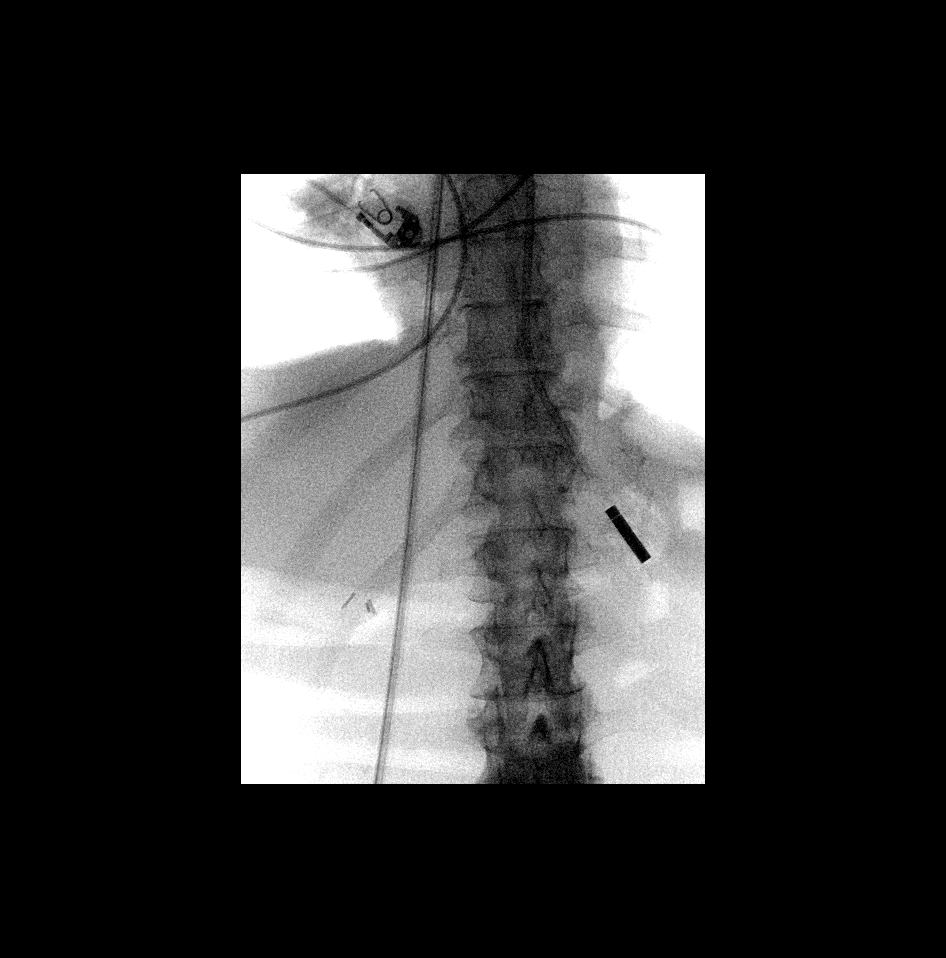

[2 of 2 positions shown; findings below may reference images not displayed]

FINDINGS: Preliminary x-ray demonstrates feeding tube tip in the proximal
stomach. There are multiple surgical clips in the left upper
quadrant related to prior Roux-en-Y gastric bypass surgery. Multiple
attempts were made under fluoroscopic guidance to advanced the
feeding tube, both with and without a stiff Amplatz guidewire. The
tube could not be advanced any further and the procedure was
ultimately stopped at the patient's request due primarily to
discomfort in the nasopharynx. The tube was resecured to the
patient's nose with final position of the tip unchanged compared to
pre-procedure study.
IMPRESSION: 1. Feeding tube terminates in the proximal stomach. Unable to
advanced any further, likely related to altered anatomy from prior
gastric bypass surgery.

## 2022-05-15 IMAGING — MR MR THORACIC SPINE WO/W CM
14 of 16 series · 38 of 48 positions shown · IV contrast (gadavist)
Comparison: 09/09/2021

CLINICAL DATA: Mid back pain with infection suspected. History of
disseminated staph infection.

EXAM:
MRI THORACIC AND LUMBAR SPINE WITHOUT AND WITH CONTRAST
TECHNIQUE: Multiplanar and multiecho pulse sequences of the thoracic and lumbar
spine were obtained without and with intravenous contrast.
CONTRAST:  5mL GADAVIST GADOBUTROL 1 MMOL/ML IV SOLN

[Series 17: T1 · sagittal · 4.0mm · 1.72mm/px · 1 of 5 slices shown (1 of 5)]
[im 1/5]
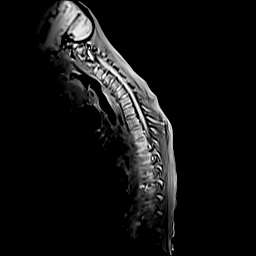

[Series 18: STIR · sagittal · 3.0mm · 1.00mm/px · 1 of 15 slices shown (1 of 2)]
[im 1/15]
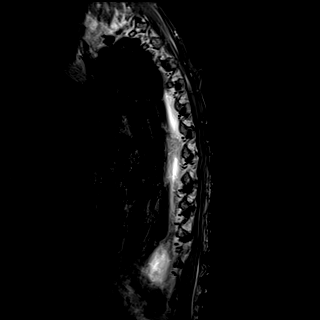

[Series 19: T1 · sagittal · 3.0mm · 1.00mm/px · 1 of 15 slices shown (2 of 5)]
[im 1/15]
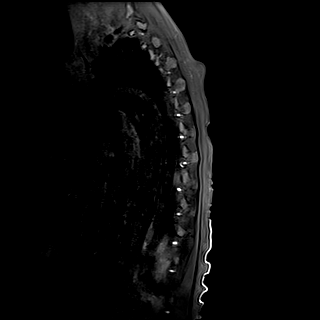

[Series 20: T2 · sagittal · 3.0mm · 0.83mm/px · 1 of 15 slices shown (1 of 4)]
[im 1/15]
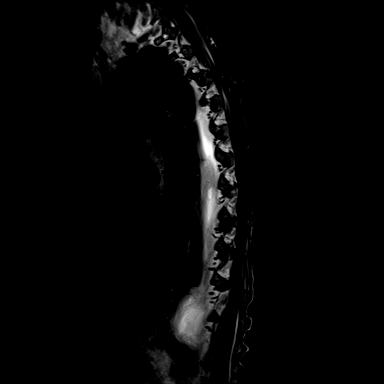

[Series 21: T2 · axial · 4.0mm · 0.78mm/px · z∈[-208,-5]mm · 4 of 40 slices shown (2 of 4)]
[im 1/40]
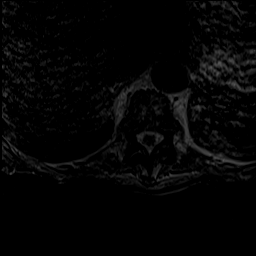
[im 14/40]
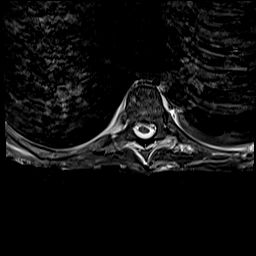
[im 27/40]
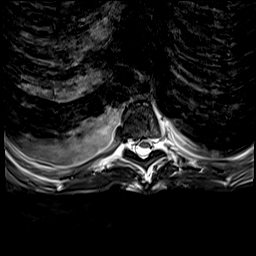
[im 40/40]
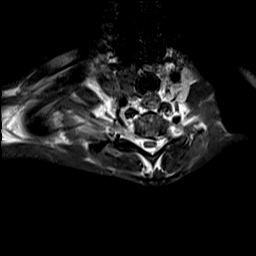

[Series 23: T1 · axial · 4.0mm · 0.52mm/px · z∈[-208,-5]mm · 5 of 40 slices shown (3 of 5)]
[im 1/40]
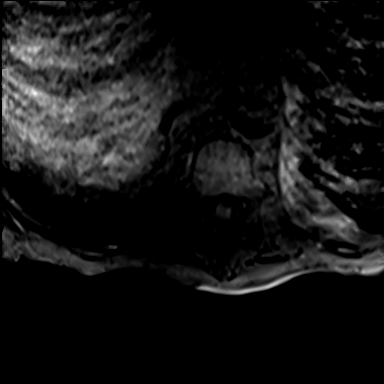
[im 10/40]
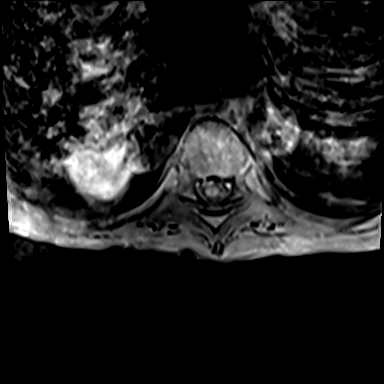
[im 20/40]
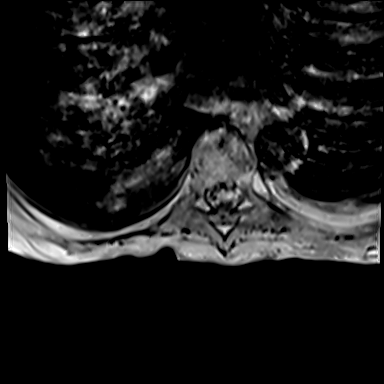
[im 30/40]
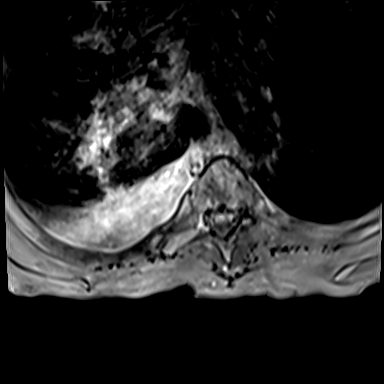
[im 40/40]
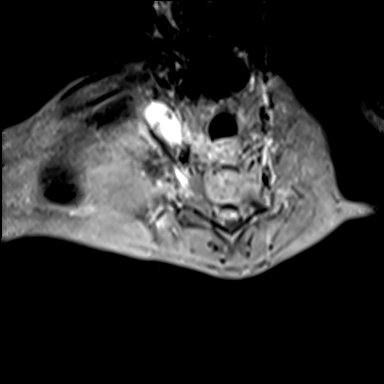

[Series 24: T1 · sagittal · 4.0mm · 0.81mm/px · 2 of 14 slices shown (4 of 5)]
[im 1/14]
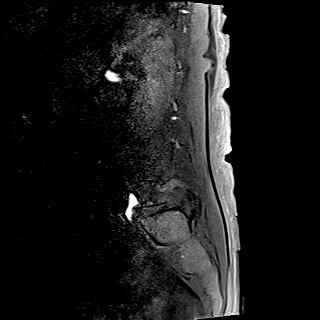
[im 14/14]
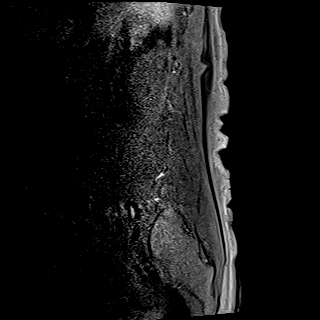

[Series 25: T2 · sagittal · 4.0mm · 0.81mm/px · 2 of 14 slices shown (3 of 4)]
[im 1/14]
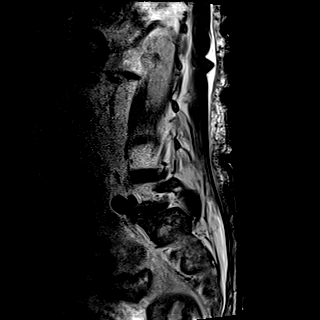
[im 14/14]
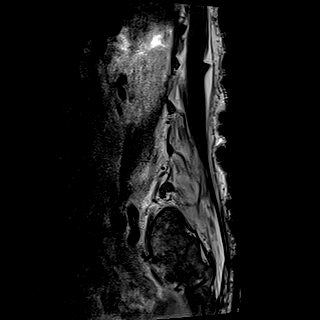

[Series 26: STIR · sagittal · 4.0mm · 0.51mm/px · 2 of 14 slices shown (2 of 2)]
[im 1/14]
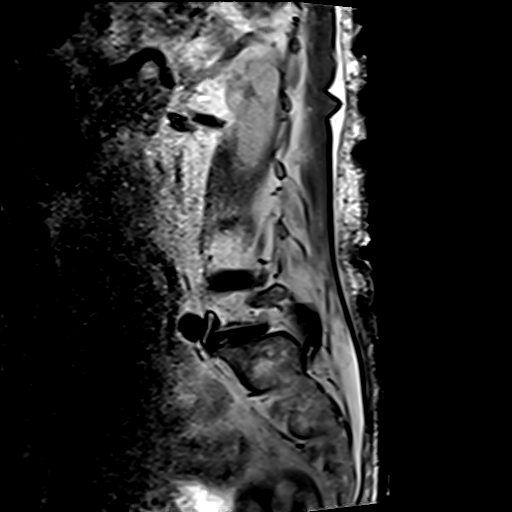
[im 14/14]
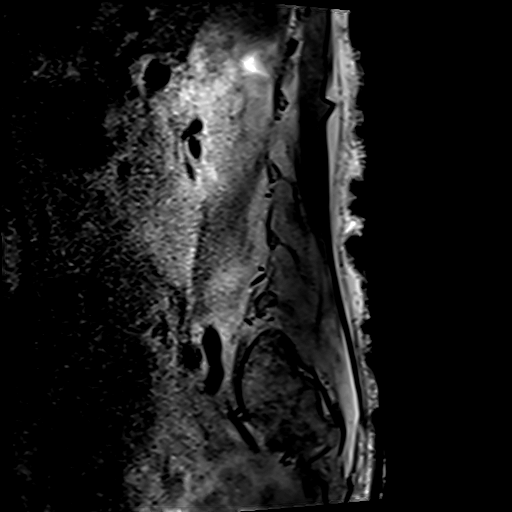

[Series 27: T2 · axial · 4.0mm · 0.78mm/px · z∈[-454,-234]mm · 5 of 43 slices shown (4 of 4)]
[im 1/43]
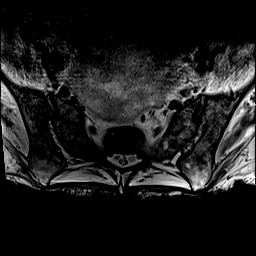
[im 11/43]
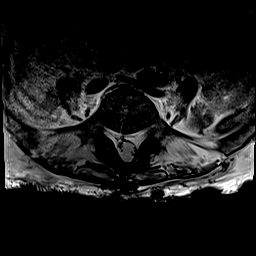
[im 22/43]
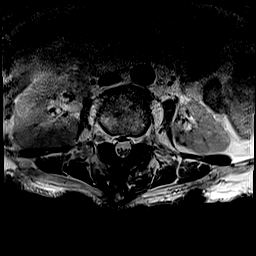
[im 32/43]
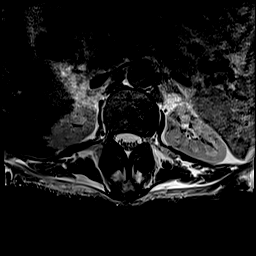
[im 43/43]
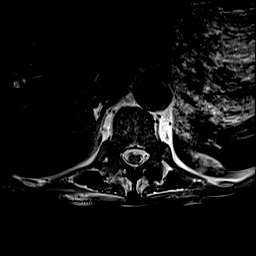

[Series 28: T1 · axial · 4.0mm · 0.52mm/px · z∈[-454,-234]mm · 5 of 43 slices shown (5 of 5)]
[im 1/43]
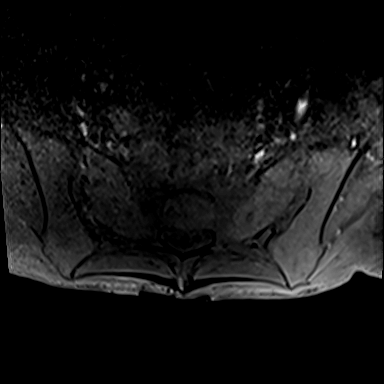
[im 11/43]
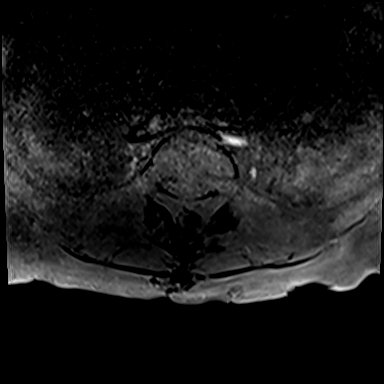
[im 22/43]
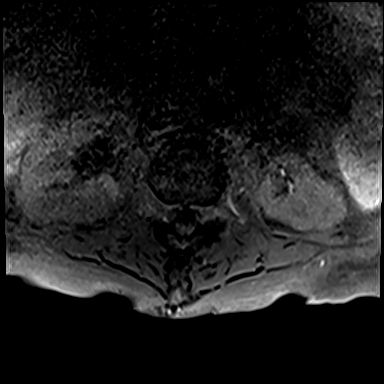
[im 32/43]
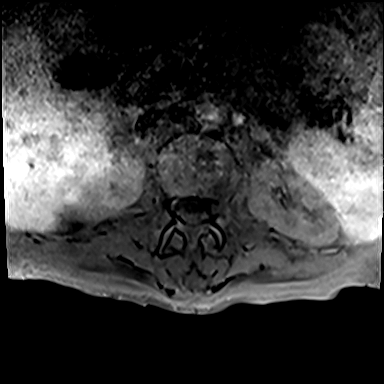
[im 43/43]
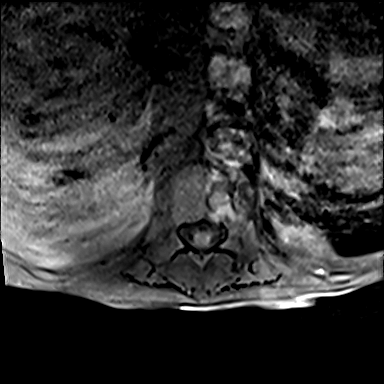

[Series 29: T1 fat-sat post-contrast · sagittal · 4.0mm · 0.81mm/px · 2 of 14 slices shown (1 of 2)]
[im 1/14]
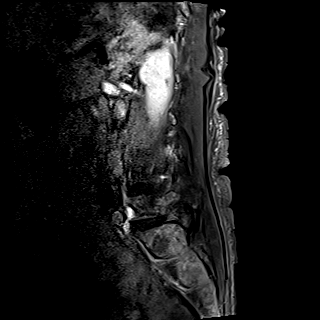
[im 14/14]
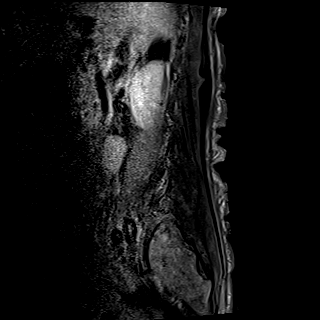

[Series 30: T1 post-contrast · axial · 4.0mm · 0.57mm/px · z∈[-455,-234]mm · 5 of 43 slices shown]
[im 1/43]
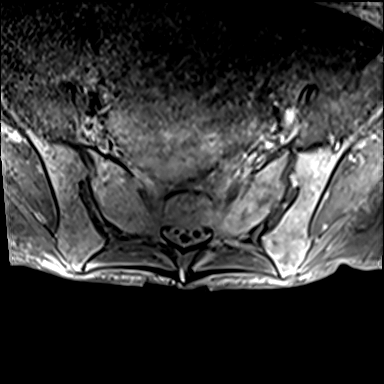
[im 11/43]
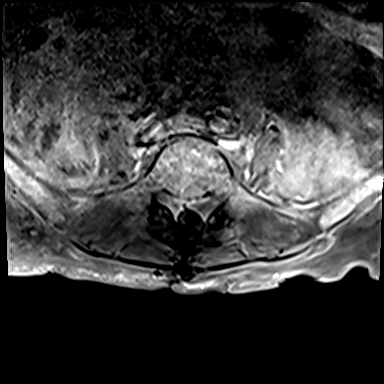
[im 22/43]
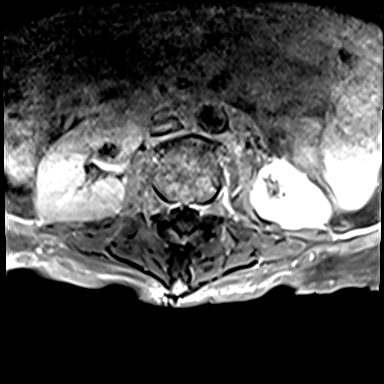
[im 32/43]
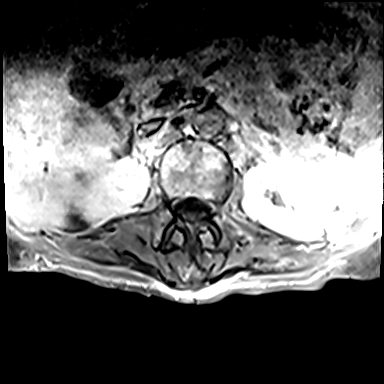
[im 43/43]
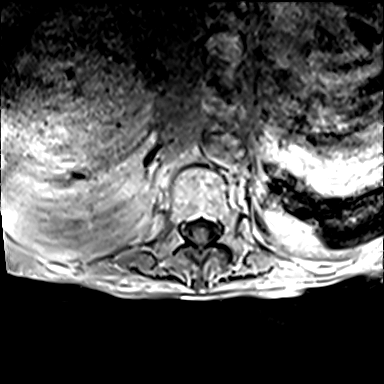

[Series 31: T1 fat-sat post-contrast · sagittal · 3.0mm · 1.00mm/px · 2 of 15 slices shown (2 of 2)]
[im 1/15]
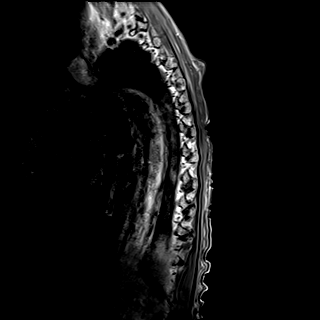
[im 15/15]
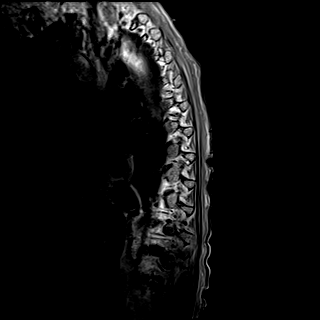

[38 of 48 positions shown; findings below may reference images not displayed]

FINDINGS: MRI THORACIC SPINE FINDINGS

Alignment:  Physiologic.

Vertebrae: No fracture, evidence of discitis, or bone lesion.

Cord: Normal signal and morphology. No visible spinal canal
collection.

Paraspinal and other soft tissues: Layering pleural effusions,
moderate on the right and small on the left.

Disc levels:

T1-2 right paracentral disc protrusion.

T2-3 bilateral paracentral protrusion.

T3-4 shallow central disc protrusion.

T4-5 right paracentral protrusion which could contact the intradural
new root.

T5-6 small right paracentral protrusion.

T6-7 central and right paracentral protrusions.

T7-8 left paracentral protrusion contacting the ventral cord.

T9-10 bilateral paracentral protrusion, larger on the left where
there could be nerve root contact

T10-11 tiny left paracentral protrusion.

T11-12 mild disc bulging.

The facets are diffusely negative.  No foraminal impingement.

MRI LUMBAR SPINE FINDINGS

Segmentation:  5 lumbar type vertebrae

Alignment:  Physiologic.

Vertebrae: Edematous appearance at the endplates of L3-4, also seen
on prior and occult by STIR imaging due to saturation quality. No
change or avid enhancement to imply osteomyelitis at this level or
elsewhere.

Conus medullaris: Extends to the L1 level and appears normal.

Paraspinal and other soft tissues: Failure of fat saturation and
diffuse T2 hyperintense appearance of intrinsic back muscles, also
seen on prior. T2 hyperintense nonenhancing lesion in the posterior
right lobe liver, similar to 09/04/2021 abdominal MRI

Disc levels:

L3-L4: Disc collapse and endplate degeneration with disc bulging and
facet spurring. The canal and foramina are patent

L4-L5: Disc narrowing and bulging with endplate and facet spurring.
Prior decompression with patent spinal canal

L5-S1:Narrowed disc with endplate and facet spurring. Patent canal
and foramina after posterior decompression.
IMPRESSION: 1. Stable compared to August 2021. No evidence of thoracolumbar
spinal infection.
2. Thoracic and lumbar disc degeneration as described. No high-grade
canal or foraminal stenosis.

## 2022-05-15 IMAGING — MR MR LUMBAR SPINE WO/W CM
14 of 16 series · 38 of 48 positions shown · IV contrast (gadavist)
Comparison: 09/09/2021

CLINICAL DATA: Mid back pain with infection suspected. History of
disseminated staph infection.

EXAM:
MRI THORACIC AND LUMBAR SPINE WITHOUT AND WITH CONTRAST
TECHNIQUE: Multiplanar and multiecho pulse sequences of the thoracic and lumbar
spine were obtained without and with intravenous contrast.
CONTRAST:  5mL GADAVIST GADOBUTROL 1 MMOL/ML IV SOLN

[Series 16: T1 · sagittal · 4.0mm · 1.72mm/px · 1 of 5 slices shown (1 of 5)]
[im 1/5]
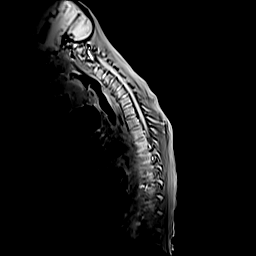

[Series 17: STIR · sagittal · 3.0mm · 1.00mm/px · 1 of 15 slices shown (1 of 2)]
[im 1/15]
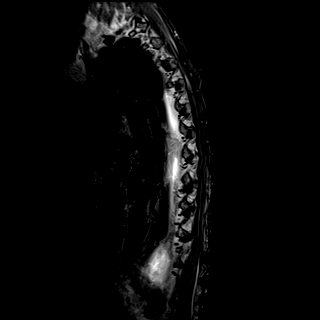

[Series 18: T1 · sagittal · 3.0mm · 1.00mm/px · 1 of 15 slices shown (2 of 5)]
[im 1/15]
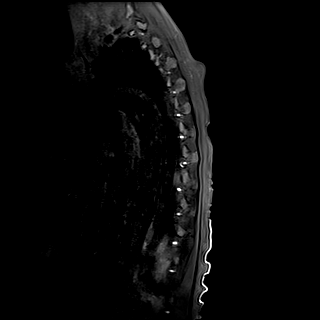

[Series 19: T2 · sagittal · 3.0mm · 0.83mm/px · 1 of 15 slices shown (1 of 4)]
[im 1/15]
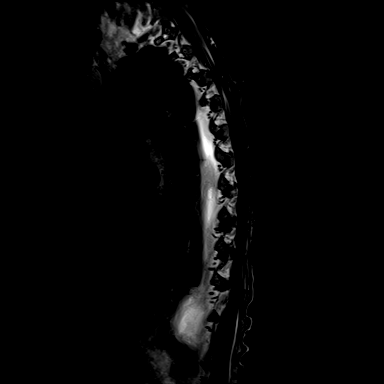

[Series 20: T2 · axial · 4.0mm · 0.78mm/px · z∈[-208,-5]mm · 4 of 40 slices shown (2 of 4)]
[im 1/40]
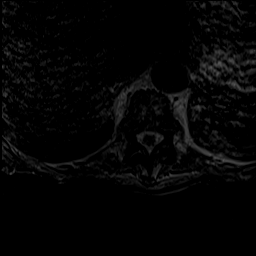
[im 14/40]
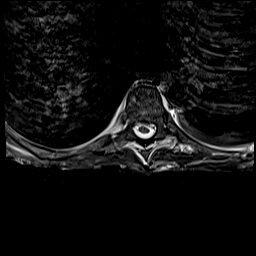
[im 27/40]
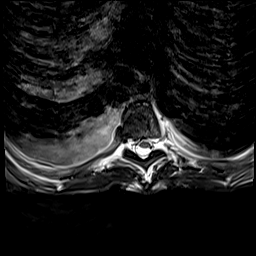
[im 40/40]
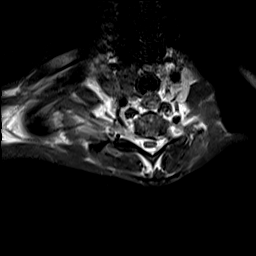

[Series 22: T1 · axial · 4.0mm · 0.52mm/px · z∈[-208,-5]mm · 5 of 40 slices shown (3 of 5)]
[im 1/40]
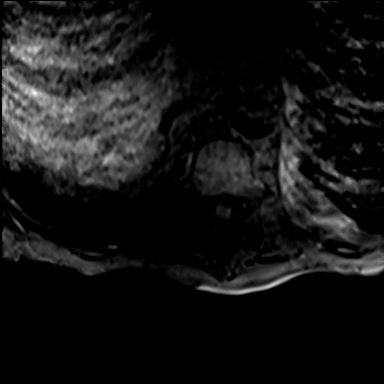
[im 10/40]
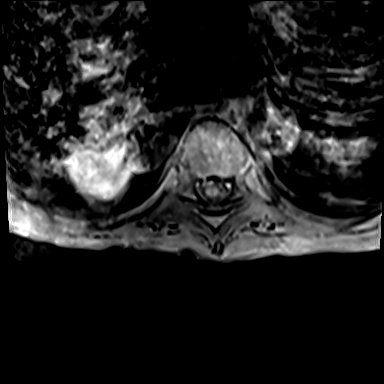
[im 20/40]
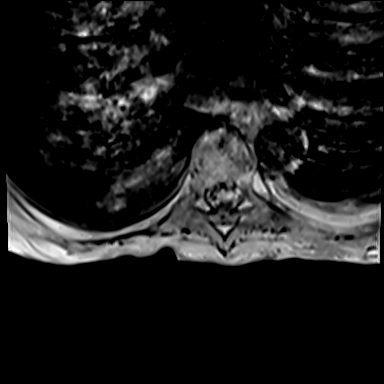
[im 30/40]
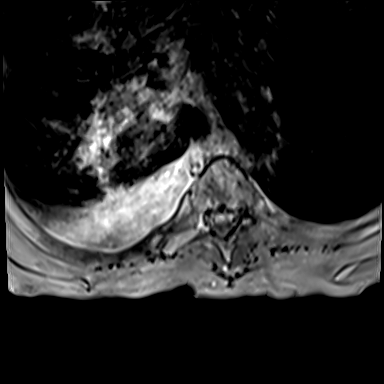
[im 40/40]
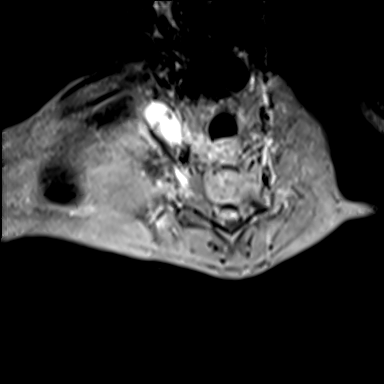

[Series 23: T1 · sagittal · 4.0mm · 0.81mm/px · 2 of 14 slices shown (4 of 5)]
[im 1/14]
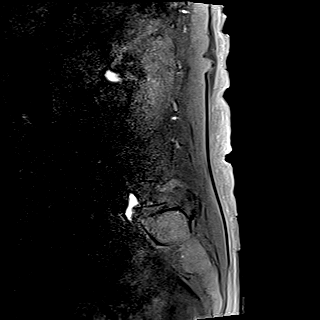
[im 14/14]
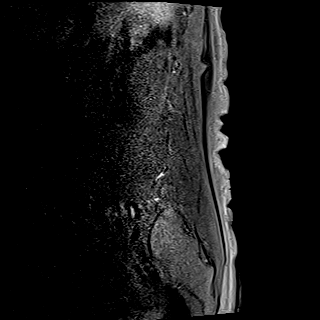

[Series 24: T2 · sagittal · 4.0mm · 0.81mm/px · 2 of 14 slices shown (3 of 4)]
[im 1/14]
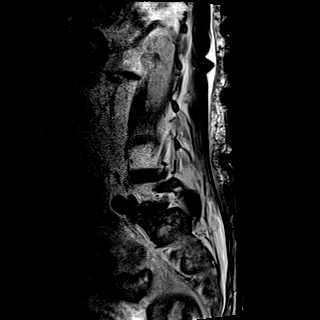
[im 14/14]
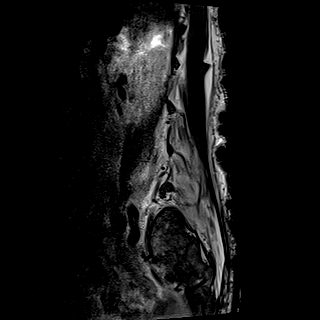

[Series 25: STIR · sagittal · 4.0mm · 0.51mm/px · 2 of 14 slices shown (2 of 2)]
[im 1/14]
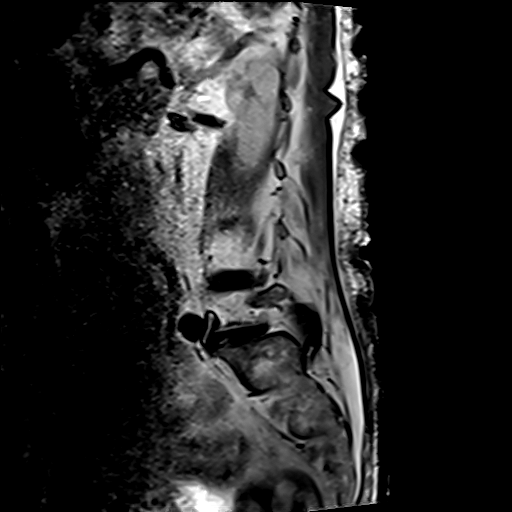
[im 14/14]
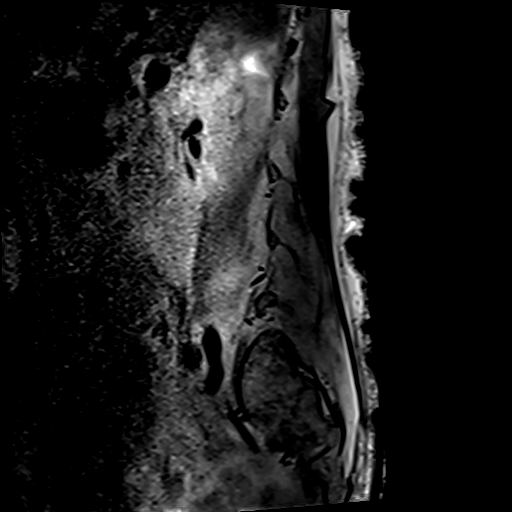

[Series 26: T2 · axial · 4.0mm · 0.78mm/px · z∈[-454,-234]mm · 5 of 43 slices shown (4 of 4)]
[im 1/43]
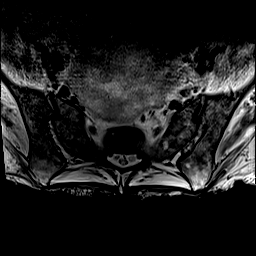
[im 11/43]
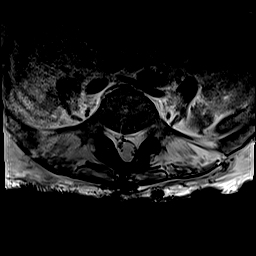
[im 22/43]
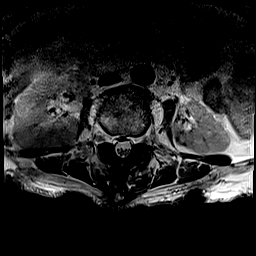
[im 32/43]
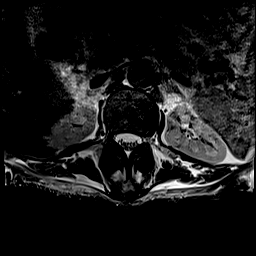
[im 43/43]
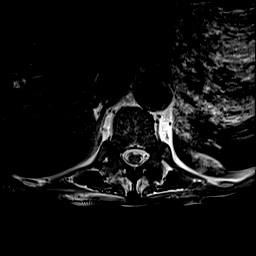

[Series 27: T1 · axial · 4.0mm · 0.52mm/px · z∈[-454,-234]mm · 5 of 43 slices shown (5 of 5)]
[im 1/43]
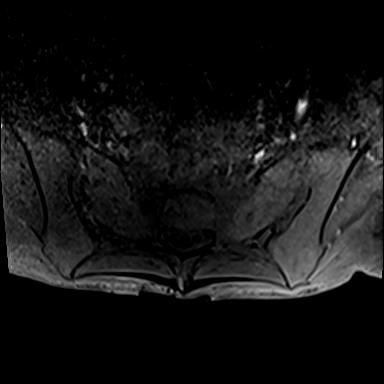
[im 11/43]
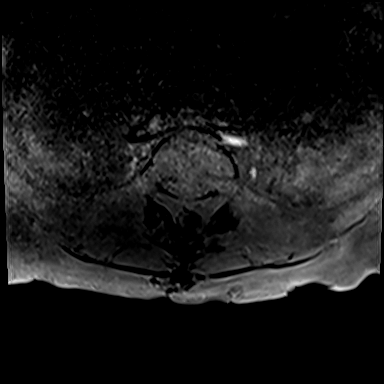
[im 22/43]
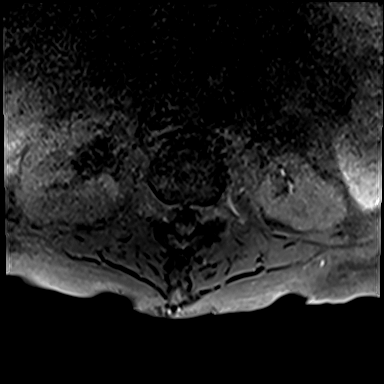
[im 32/43]
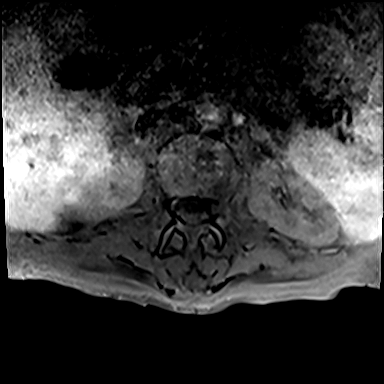
[im 43/43]
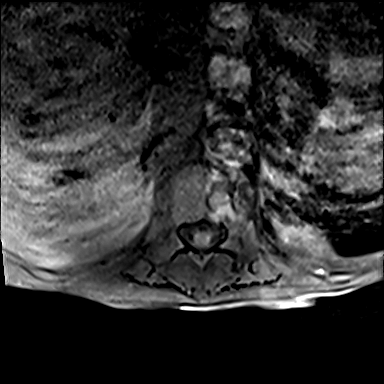

[Series 28: T1 fat-sat post-contrast · sagittal · 4.0mm · 0.81mm/px · 2 of 14 slices shown (1 of 2)]
[im 1/14]
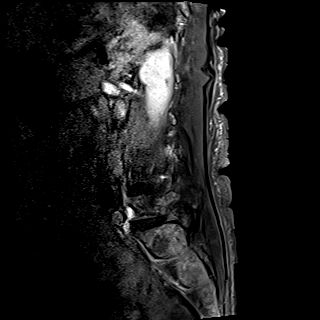
[im 14/14]
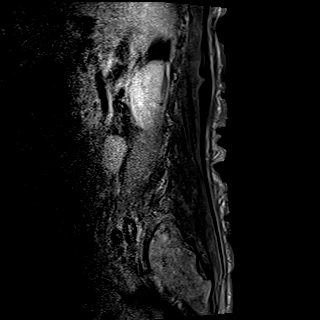

[Series 29: T1 post-contrast · axial · 4.0mm · 0.57mm/px · z∈[-455,-234]mm · 5 of 43 slices shown]
[im 1/43]
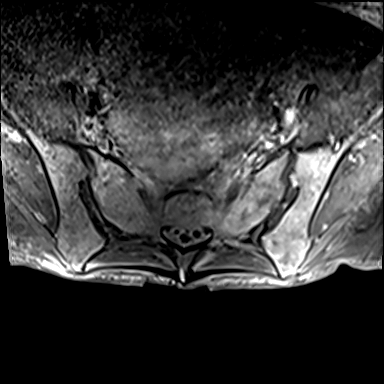
[im 11/43]
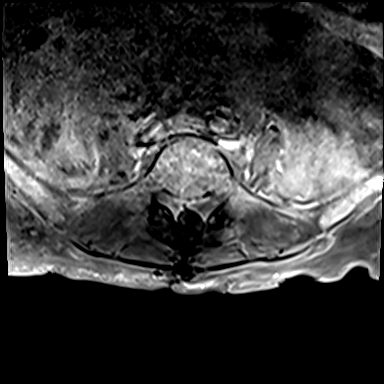
[im 22/43]
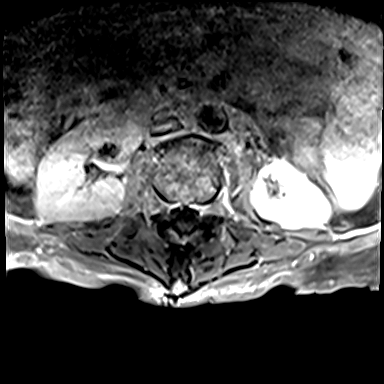
[im 32/43]
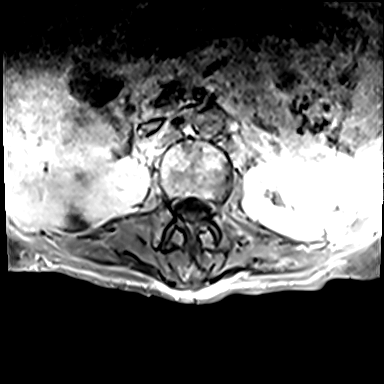
[im 43/43]
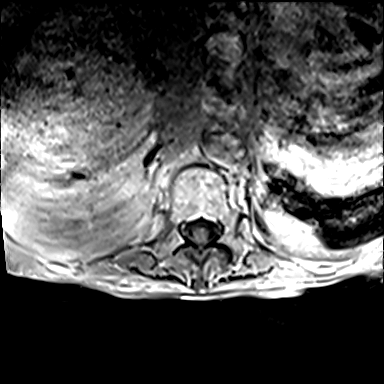

[Series 30: T1 fat-sat post-contrast · sagittal · 3.0mm · 1.00mm/px · 2 of 15 slices shown (2 of 2)]
[im 1/15]
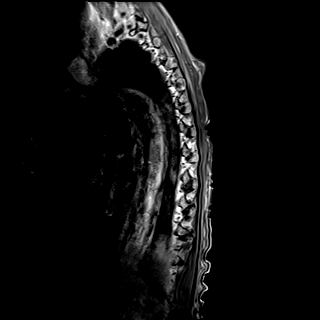
[im 15/15]
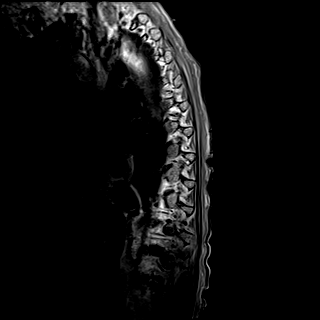

[38 of 48 positions shown; findings below may reference images not displayed]

FINDINGS: MRI THORACIC SPINE FINDINGS

Alignment:  Physiologic.

Vertebrae: No fracture, evidence of discitis, or bone lesion.

Cord: Normal signal and morphology. No visible spinal canal
collection.

Paraspinal and other soft tissues: Layering pleural effusions,
moderate on the right and small on the left.

Disc levels:

T1-2 right paracentral disc protrusion.

T2-3 bilateral paracentral protrusion.

T3-4 shallow central disc protrusion.

T4-5 right paracentral protrusion which could contact the intradural
new root.

T5-6 small right paracentral protrusion.

T6-7 central and right paracentral protrusions.

T7-8 left paracentral protrusion contacting the ventral cord.

T9-10 bilateral paracentral protrusion, larger on the left where
there could be nerve root contact

T10-11 tiny left paracentral protrusion.

T11-12 mild disc bulging.

The facets are diffusely negative.  No foraminal impingement.

MRI LUMBAR SPINE FINDINGS

Segmentation:  5 lumbar type vertebrae

Alignment:  Physiologic.

Vertebrae: Edematous appearance at the endplates of L3-4, also seen
on prior and occult by STIR imaging due to saturation quality. No
change or avid enhancement to imply osteomyelitis at this level or
elsewhere.

Conus medullaris: Extends to the L1 level and appears normal.

Paraspinal and other soft tissues: Failure of fat saturation and
diffuse T2 hyperintense appearance of intrinsic back muscles, also
seen on prior. T2 hyperintense nonenhancing lesion in the posterior
right lobe liver, similar to 09/04/2021 abdominal MRI

Disc levels:

L3-L4: Disc collapse and endplate degeneration with disc bulging and
facet spurring. The canal and foramina are patent

L4-L5: Disc narrowing and bulging with endplate and facet spurring.
Prior decompression with patent spinal canal

L5-S1:Narrowed disc with endplate and facet spurring. Patent canal
and foramina after posterior decompression.
IMPRESSION: 1. Stable compared to August 2021. No evidence of thoracolumbar
spinal infection.
2. Thoracic and lumbar disc degeneration as described. No high-grade
canal or foraminal stenosis.

## 2022-05-18 IMAGING — DX DG CHEST 1V PORT
1 series · 1 of 1 positions shown · non-contrast
Comparison: Radiograph 09/27/2021

CLINICAL DATA: Dark stool.  Chronic anemia.

EXAM:
PORTABLE CHEST 1 VIEW

[chest ap]
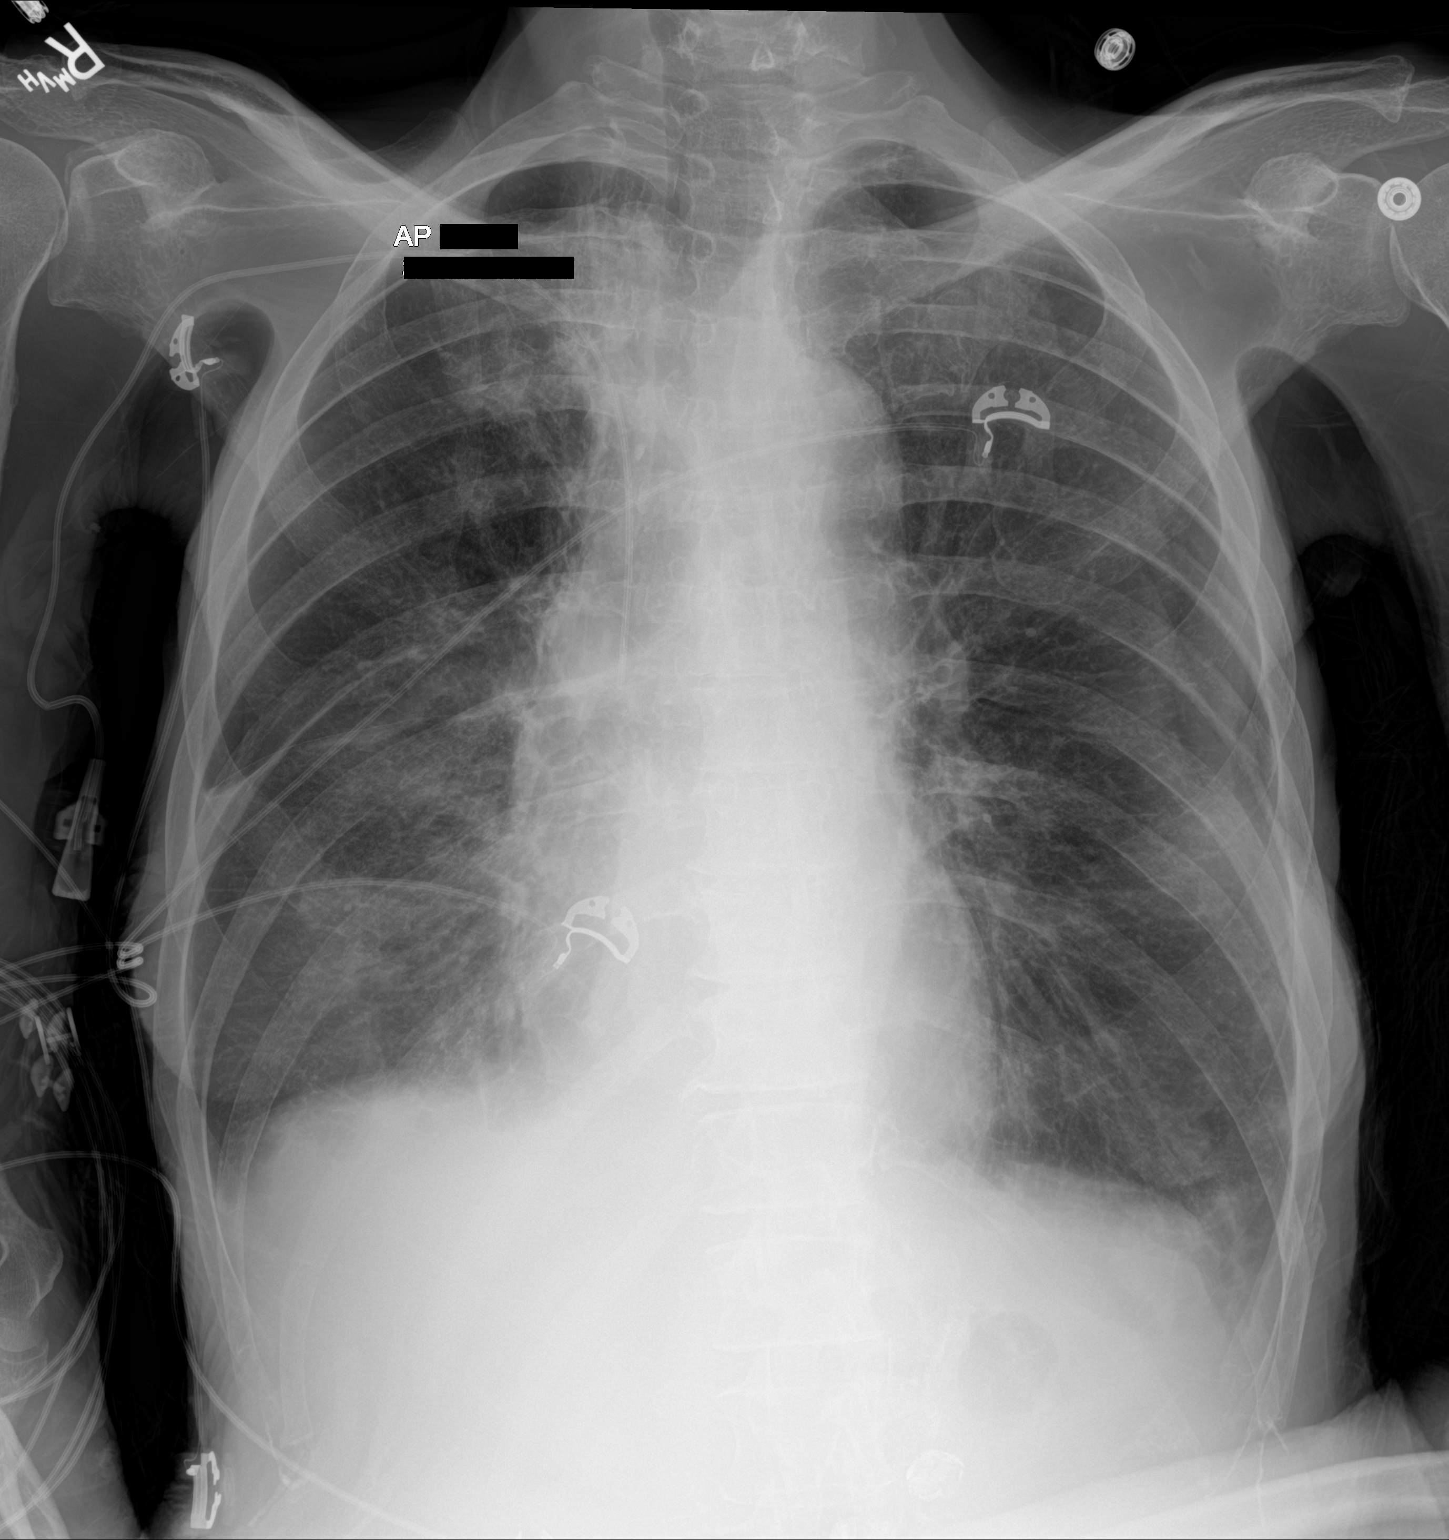

[1 of 1 positions shown; findings below may reference images not displayed]

FINDINGS: Normal cardiac silhouette. RIGHT central venous line tip in distal
SVC. Lungs are hyperinflated. Small bilateral pleural effusions.
Mild airspace disease in the RIGHT lower lobe is improved from
comparison exam.
IMPRESSION: 1. Improvement in RIGHT lower lobe airspace disease.
2. Hyperinflated lungs.
3. Small effusions.

## 2022-05-20 IMAGING — DX DG CHEST 1V PORT
1 series · 1 of 1 positions shown · non-contrast
Comparison: 10/05/2021

CLINICAL DATA: Respiratory compromise

EXAM:
PORTABLE CHEST 1 VIEW

[chest ap]
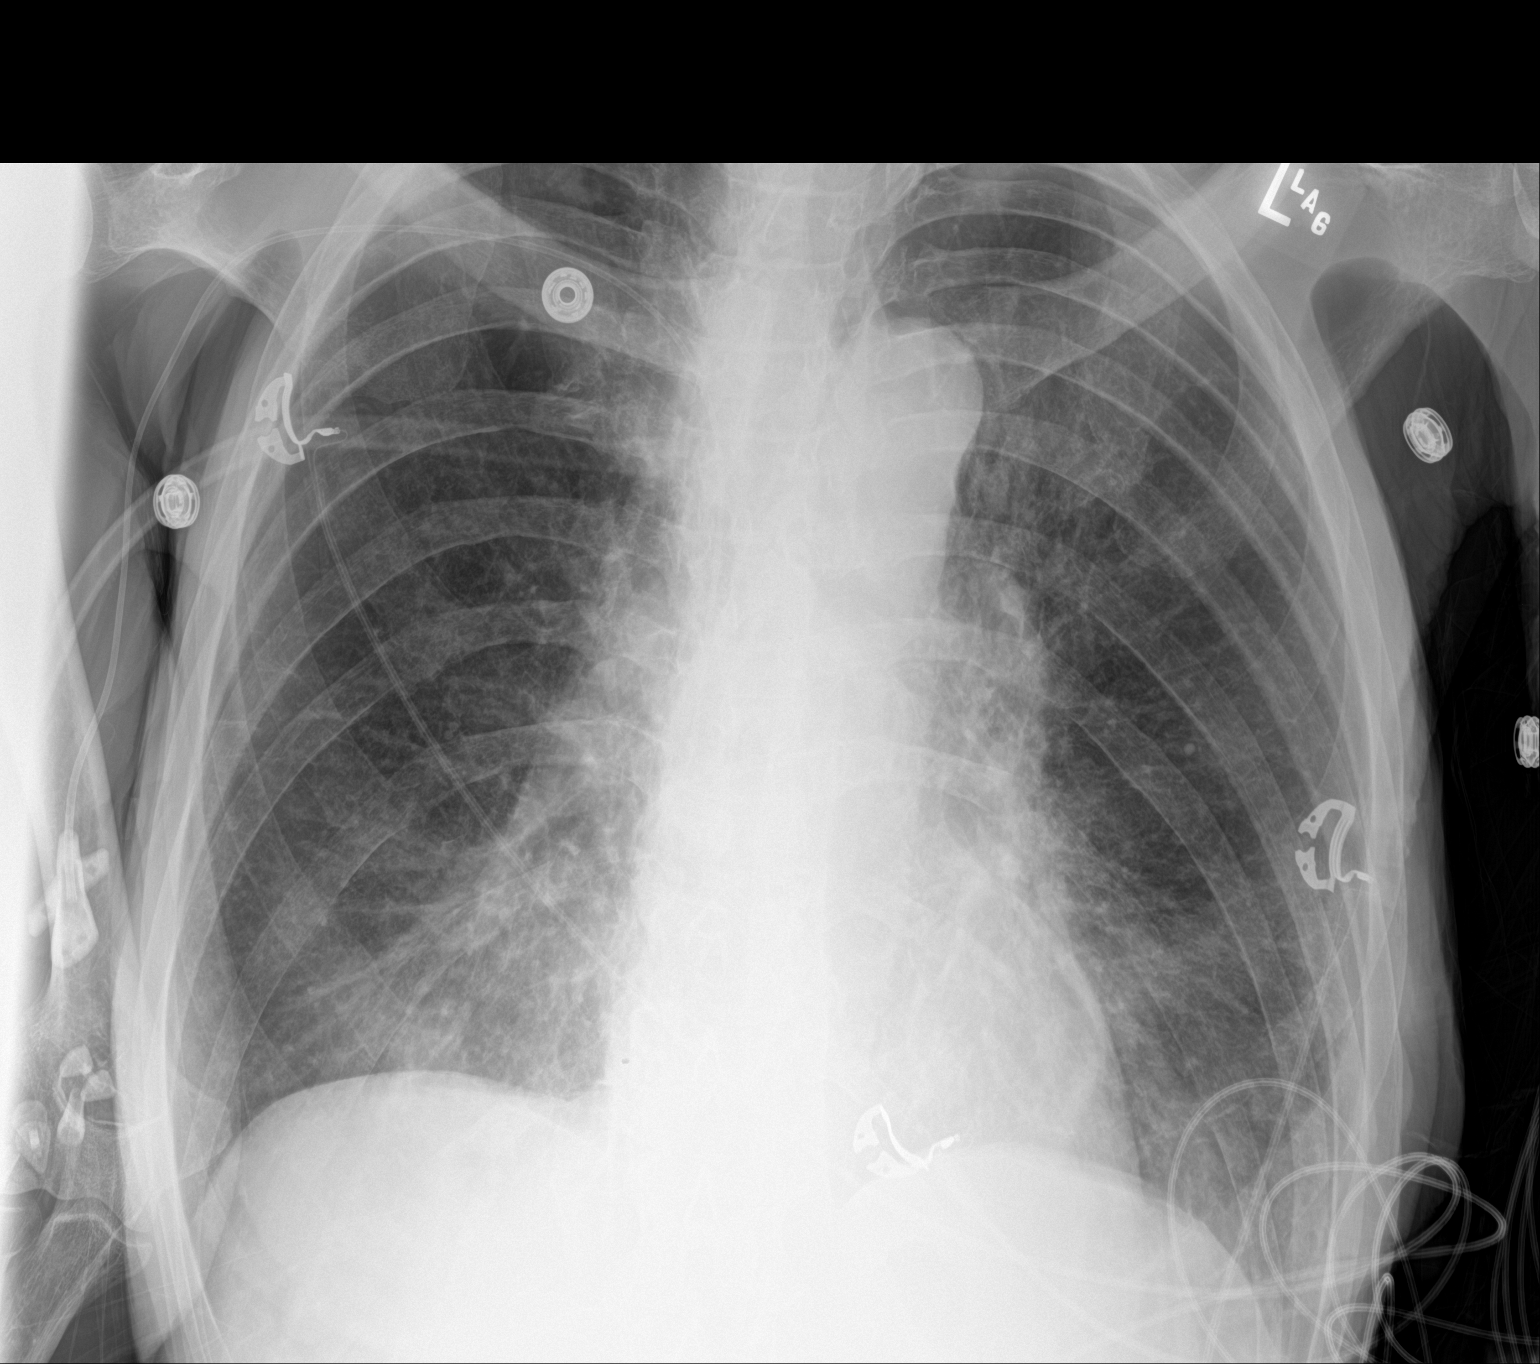

[1 of 1 positions shown; findings below may reference images not displayed]

FINDINGS: Cardiac shadow is stable. Right-sided PICC is again seen and stable.
The lungs are hyperinflated. Patchy airspace opacity is noted in the
bases bilaterally consistent with atelectasis. No bony abnormality
is noted.
IMPRESSION: Slight increase in basilar atelectasis.

## 2022-06-06 IMAGING — CR DG CHEST 2V
2 series · 2 of 2 positions shown · non-contrast
Comparison: Multiple chest x-rays since September 24, 2021. CT scan of the
chest September 18, 2021.

CLINICAL DATA: Left-sided chest pain.

EXAM:
CHEST - 2 VIEW

[chest ap]
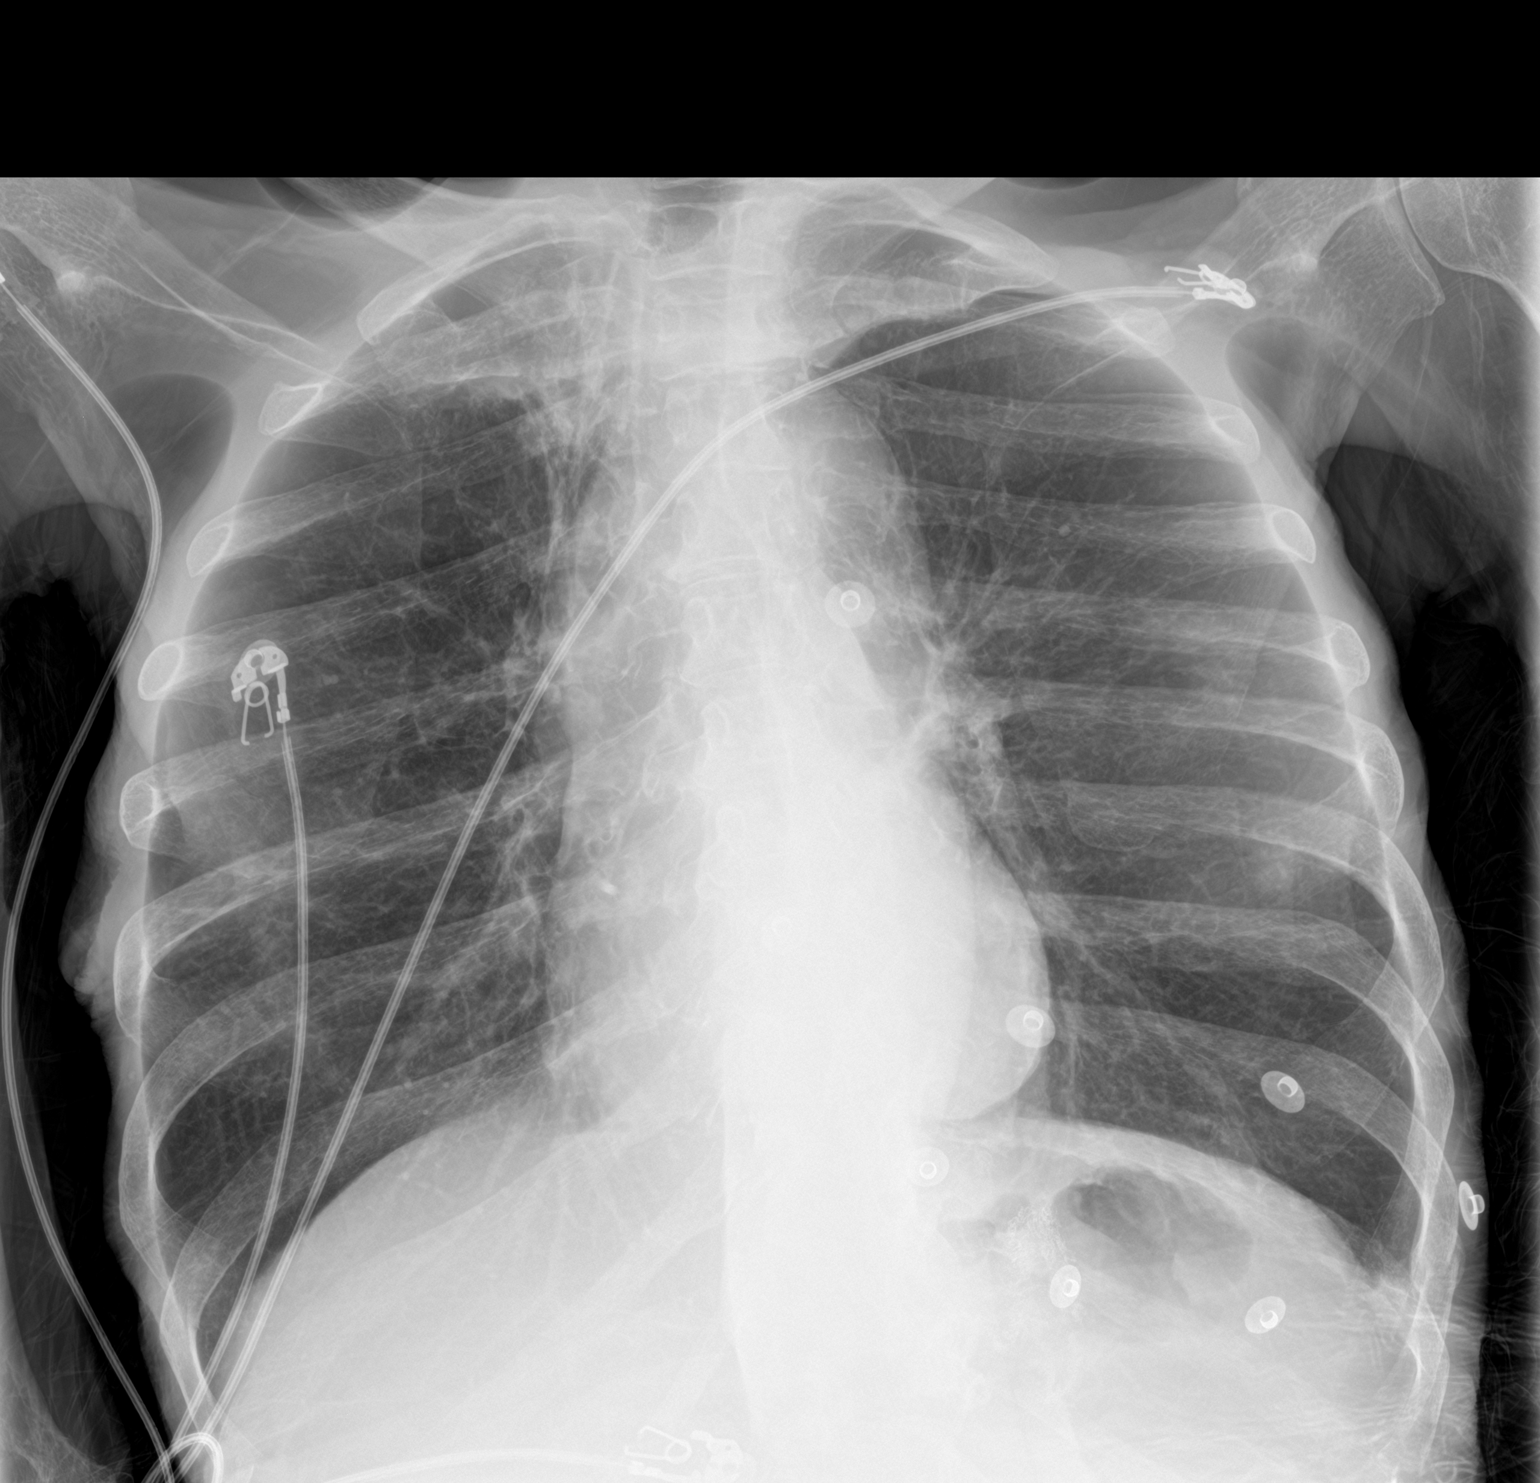

[chest lat]
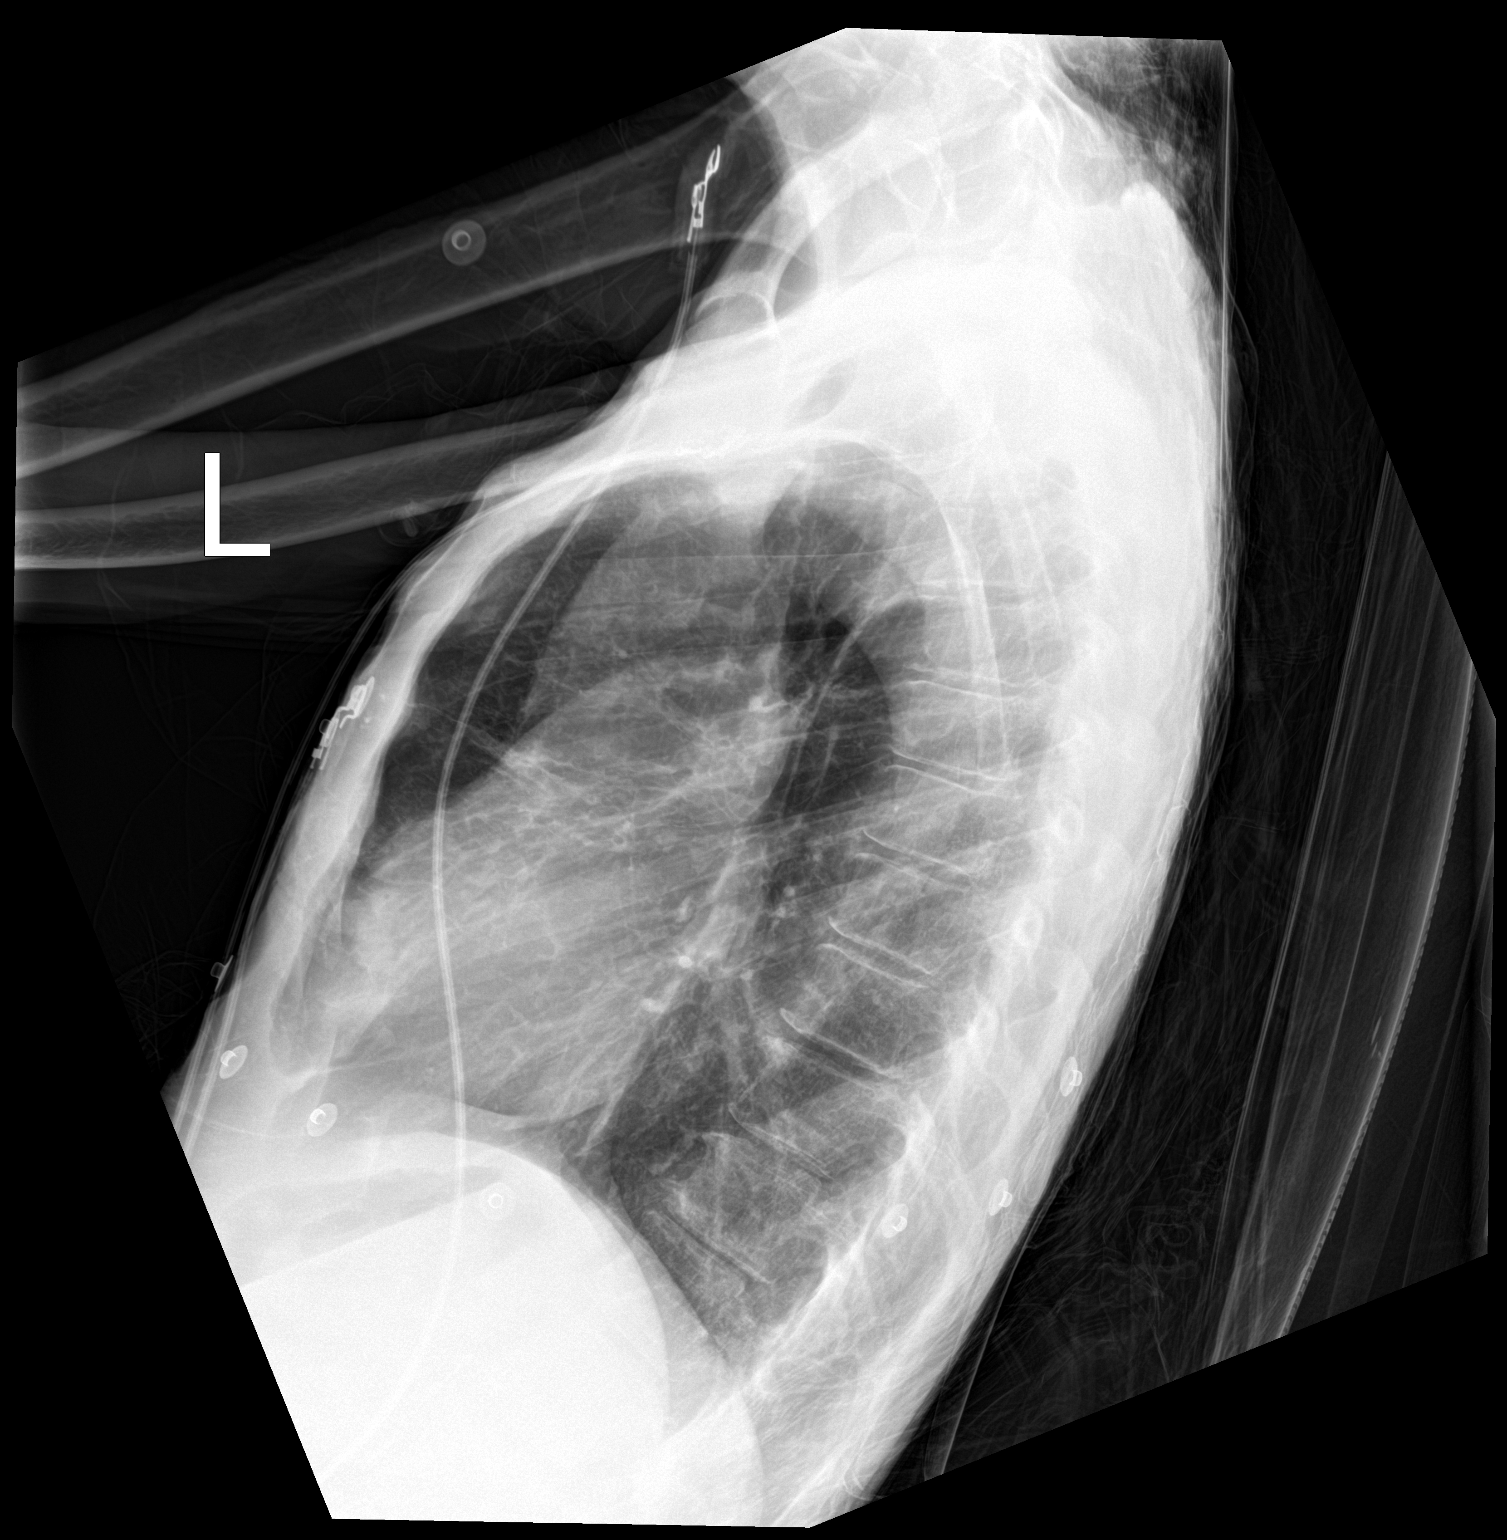

[2 of 2 positions shown; findings below may reference images not displayed]

FINDINGS: Mild opacity in the medial right upper lobe. Mild opacity in the
right mid lung. A nodular density over the lateral left mid lung is
consistent with a nipple shadow. No pneumothorax. The
cardiomediastinal silhouette is stable.
IMPRESSION: Opacity in the medial right upper lobe could represent atelectasis
or infiltrate. Suspected subtle opacity in the right mid lung,
concerning for pneumonia. Recommend short-term follow-up imaging to
ensure resolution.

No other acute abnormalities are identified. The cardiomediastinal
silhouette is stable with a tortuous thoracic aorta.

## 2022-06-15 IMAGING — DX DG CHEST 2V
3 series · 3 of 3 positions shown · non-contrast
Comparison: October 24, 21.

CLINICAL DATA: Chest pain.

EXAM:
CHEST - 2 VIEW

[chest ap (1 of 2)]
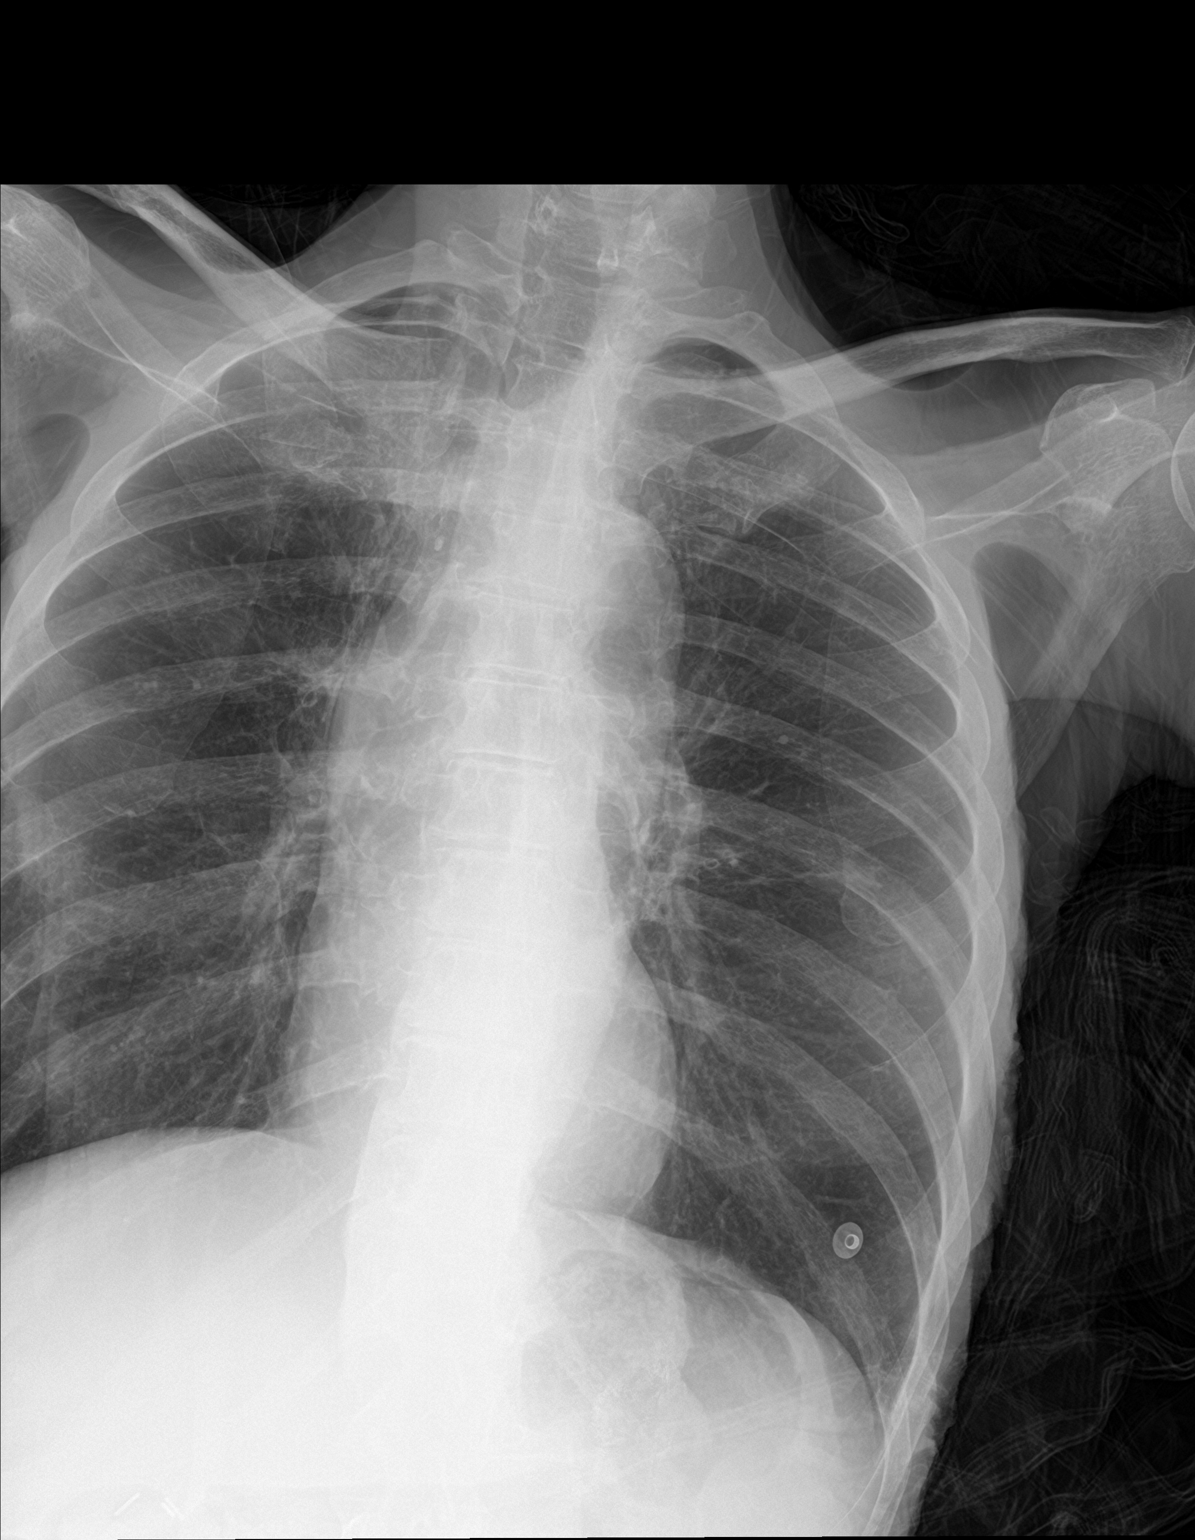

[chest ap (2 of 2)]
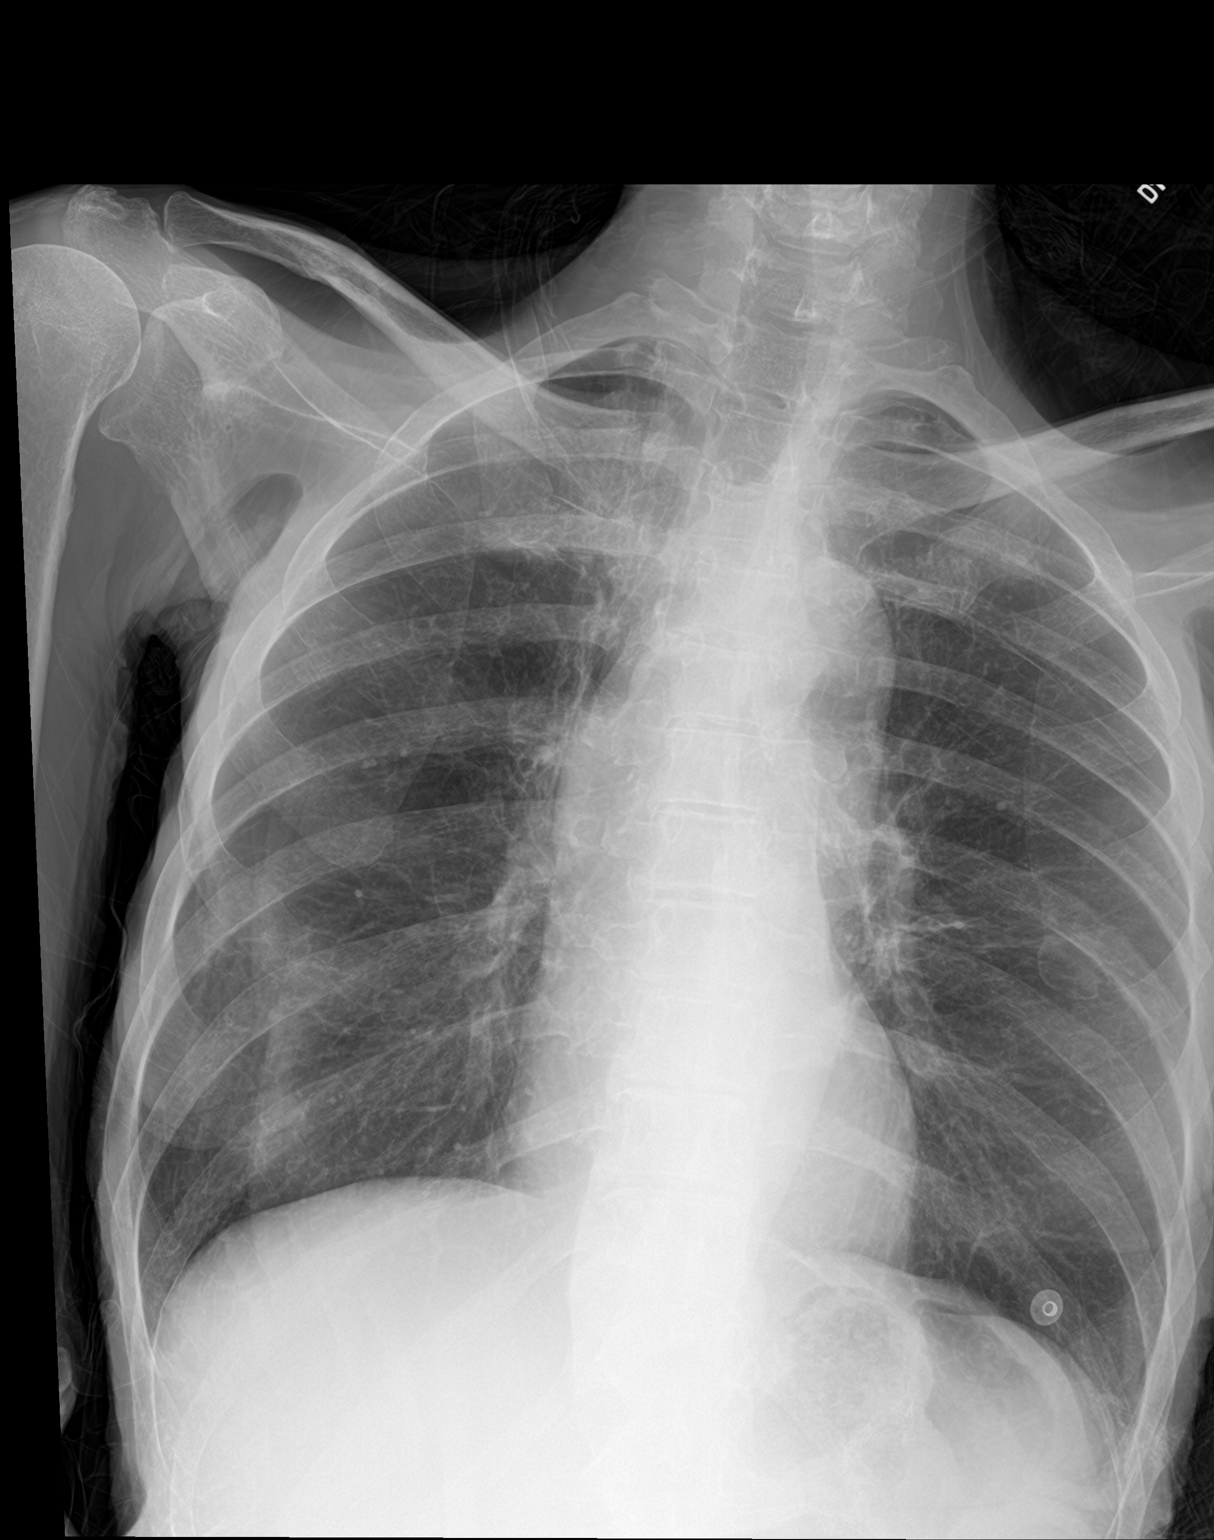

[chest lat]
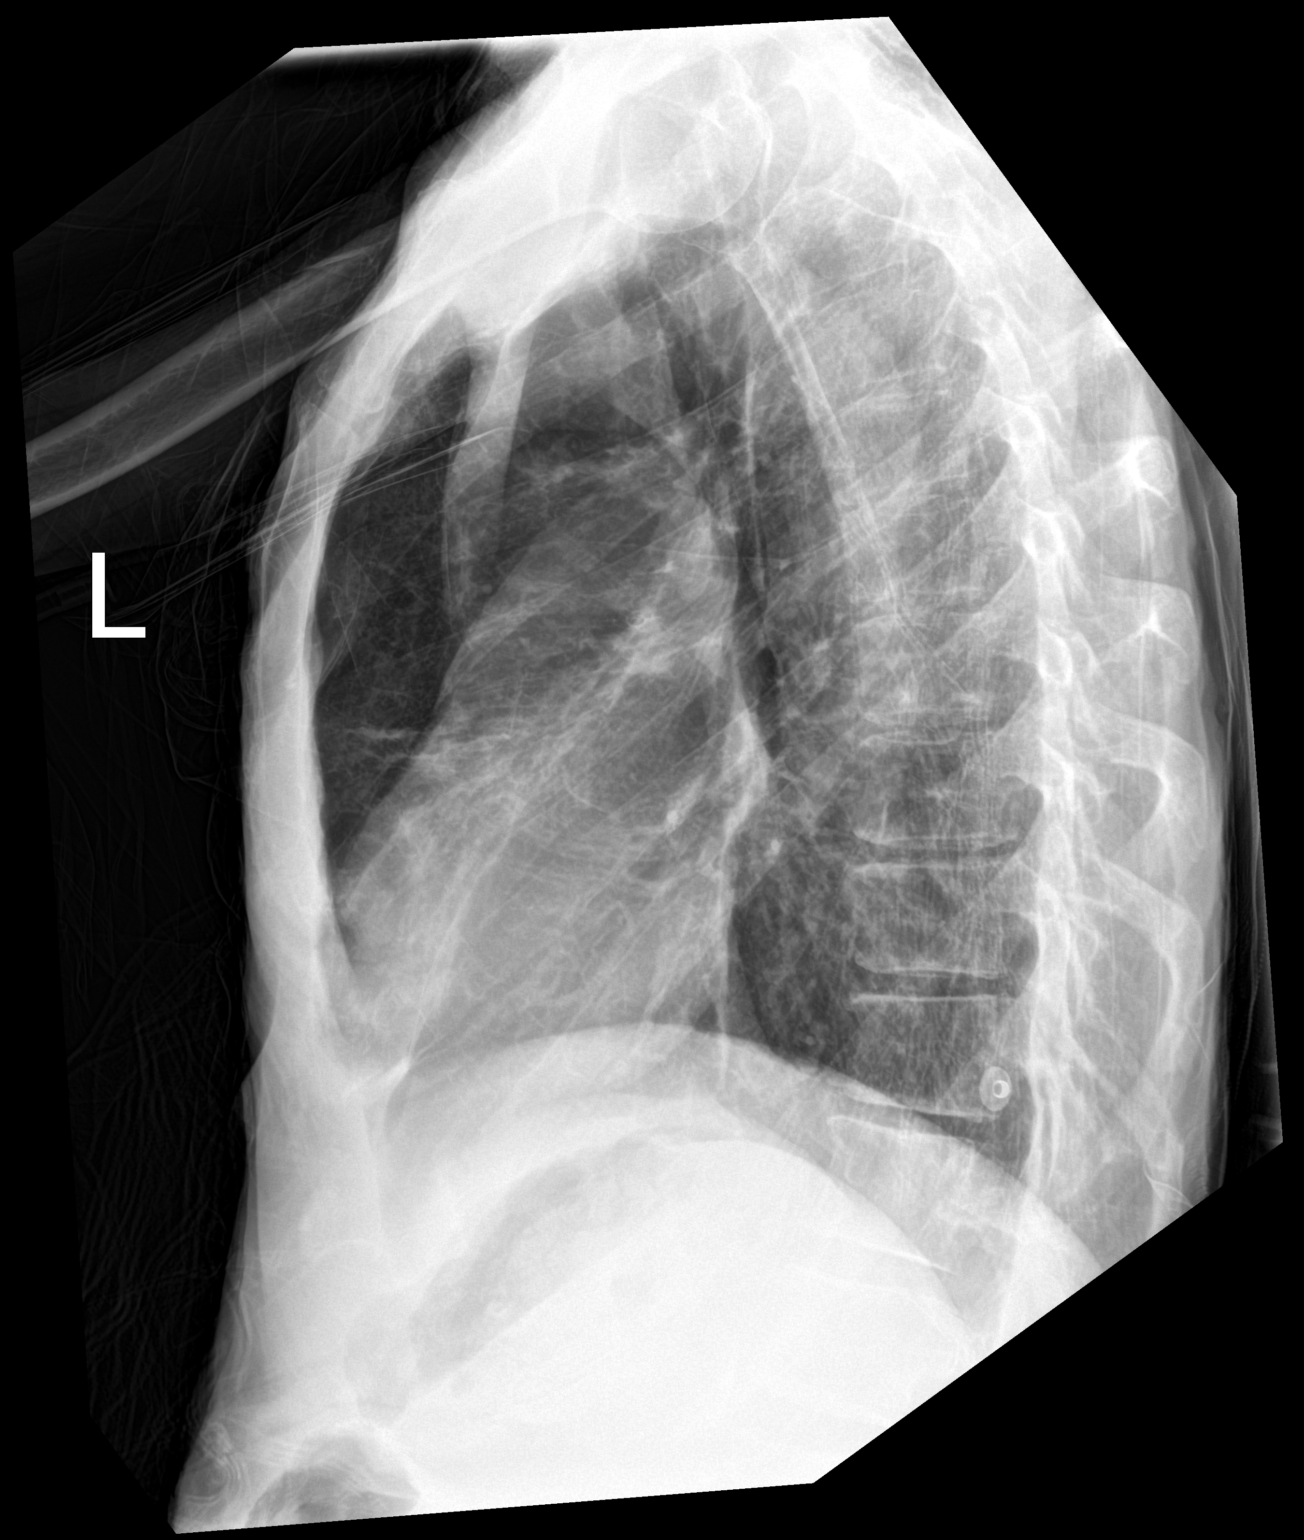

[3 of 3 positions shown; findings below may reference images not displayed]

FINDINGS: Right-sided skin folds with vague right midlung opacities. No
visible pneumothorax. No pleural effusions. Cardiomediastinal
silhouette is unchanged. Mild hyperinflation. No displaced fracture.
IMPRESSION: Right-sided skin folds with vague right midlung opacities, similar
to recent chest radiograph from October 24, 2021. Findings could
represent artifact or pneumonia. Recommend close short interval
follow-up to ensure resolution.

## 2022-06-15 IMAGING — CT CT ANGIO CHEST
2 of 7 series · 15 of 46 positions shown · IV contrast (APPLIED)
Comparison: Chest CT dated 09/18/2021. CT abdomen pelvis dated
08/31/2021.

CLINICAL DATA: Epigastric pain. Concern for pulmonary embolism and
bowel obstruction. Recent diagnosis of liver abscess.



[Series 10: thins · axial · 0.70mm/px · z∈[+952,+1255]mm · 13 of 341 slices shown]
[im 19/341  lung]
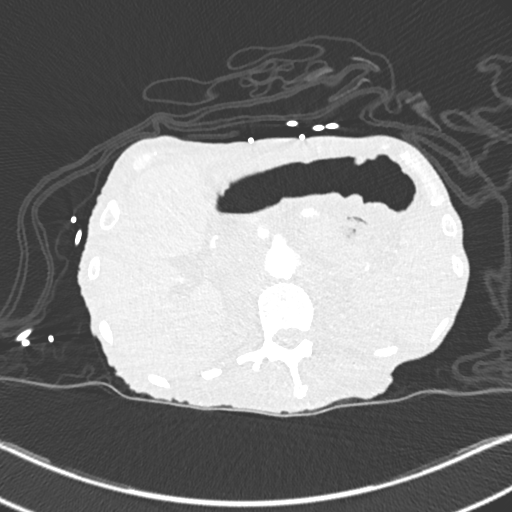
[im 38/341  soft-tissue]
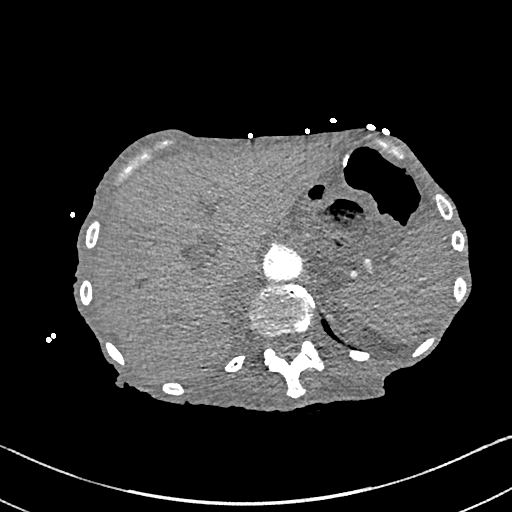
[im 76/341  lung]
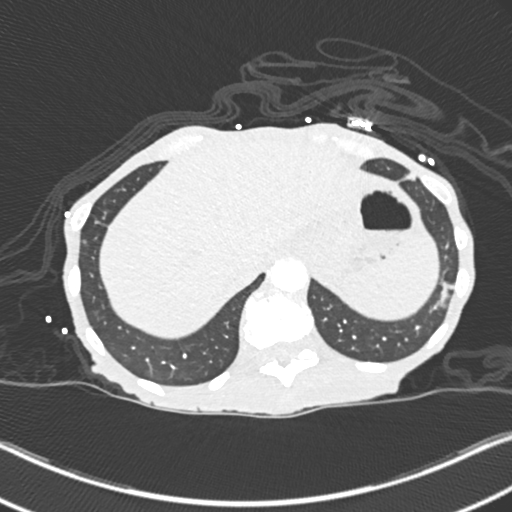
[im 95/341  soft-tissue]
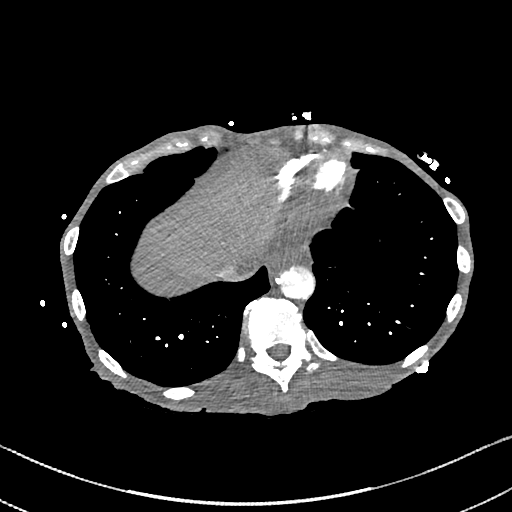
[im 114/341  lung]
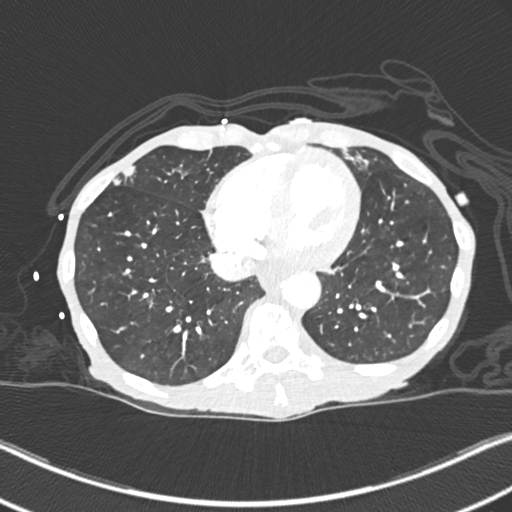
[im 152/341  soft-tissue]
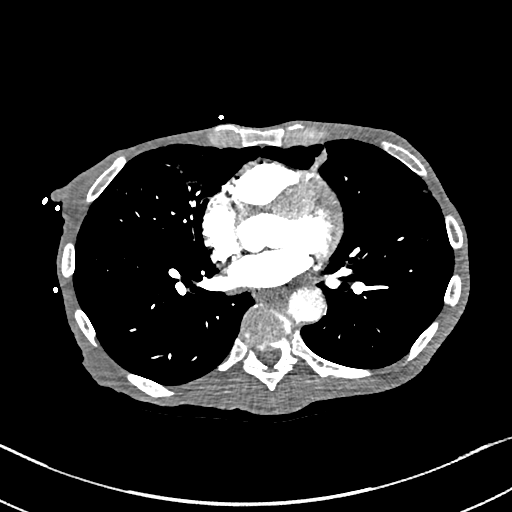
[im 171/341  lung]
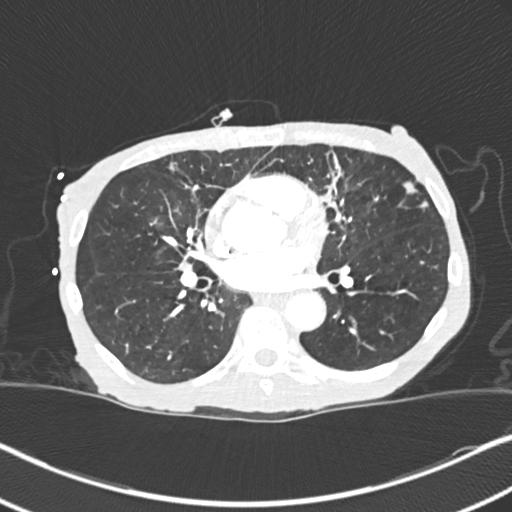
[im 189/341  soft-tissue]
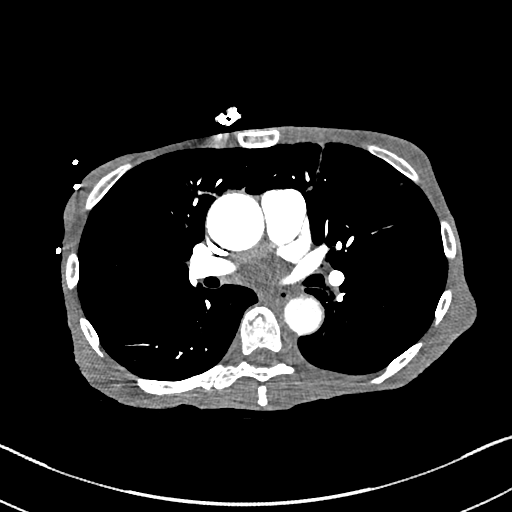
[im 227/341  lung]
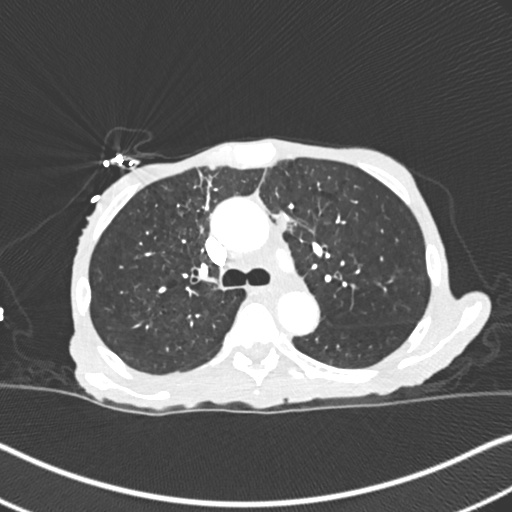
[im 246/341  soft-tissue]
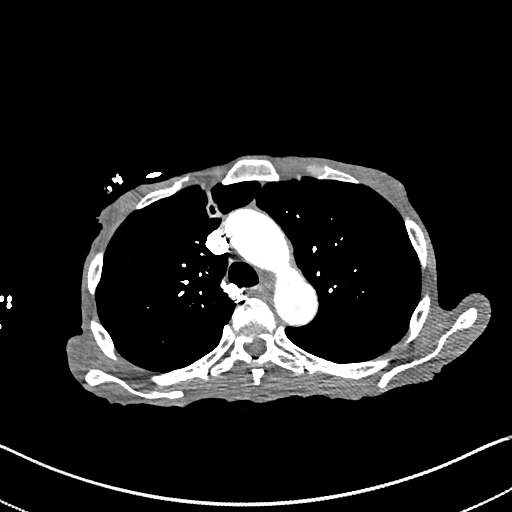
[im 265/341  lung]
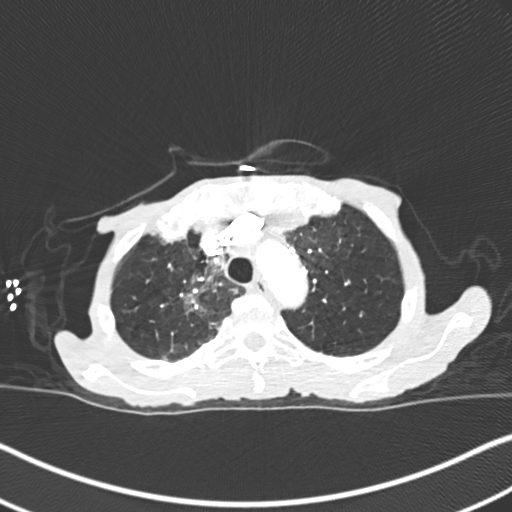
[im 303/341  soft-tissue]
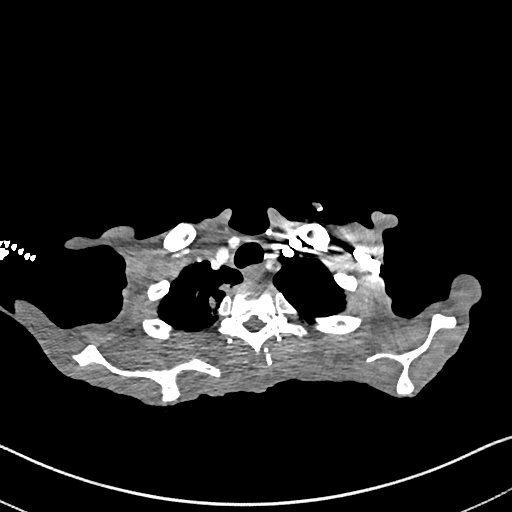
[im 322/341  lung]
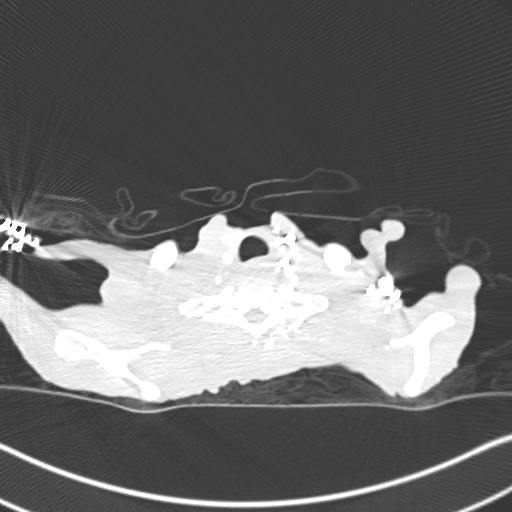

[Series 12: coronal mpr · coronal · 0.62mm/px · 2 of 70 slices shown]
[im 24/70  soft-tissue]
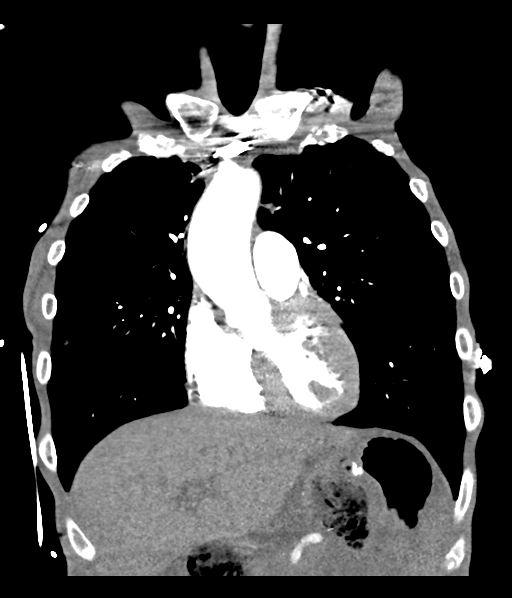
[im 47/70  soft-tissue]
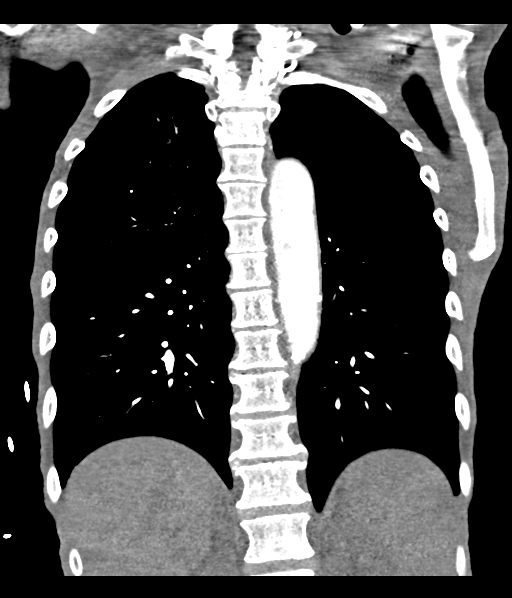

[15 of 46 positions shown; findings below may reference images not displayed]

RADIATION DOSE REDUCTION: This exam was performed according to the
departmental dose-optimization program which includes automated
exposure control, adjustment of the mA and/or kV according to
patient size and/or use of iterative reconstruction technique.

CONTRAST:  100mL OMNIPAQUE IOHEXOL 350 MG/ML SOLN
FINDINGS: Evaluation is limited due to anasarca and cachexia.

CTA CHEST FINDINGS

Cardiovascular: There is no cardiomegaly or pericardial effusion.
Mild atherosclerotic calcification of the thoracic aorta. No
aneurysmal dilatation or dissection. Linear and bandlike
nonocclusive density in the right lower lobe pulmonary artery branch
(210/10) likely scarring and sequela prior PE. Acute PE is less
likely. Clinical correlation is recommended. No other pulmonary
artery embolus identified.

Mediastinum/Nodes: No obvious hilar or mediastinal adenopathy.
Evaluation however is limited due to anasarca and cachexia. Apparent
diffuse thickening of the esophagus which may be related to
anasarca. Clinical correlation is recommended to evaluate for
esophagitis. No mediastinal fluid collection.

Lungs/Pleura: Background of emphysema. Scattered clusters of nodular
and streaky densities predominantly involving the upper lobes may
represent residual infection or sequela of prior infection.
Recurrent pneumonia is not excluded overall significant interval
improvement in pulmonary opacities compared to prior CT and
significant improvement in aeration of the lungs. Interval complete
resolution of the previously seen pleural effusion. No pneumothorax.
The central airways are patent.

Musculoskeletal: Diffuse subcutaneous edema and anasarca. Loss of
subcutaneous fat and cachexia. No acute osseous pathology. Old
fracture of lateral left ninth rib.

Review of the MIP images confirms the above findings.

CT ABDOMEN and PELVIS FINDINGS

Tiny linear pockets of air along the anterior wall of the stomach
(axial [DATE]), likely intraluminal. Small pneumoperitoneum is less
likely but difficult to exclude given cachexia. Overall interval
decrease in the mesenteric edema compared to prior CT.

Hepatobiliary: There is heterogeneous enhancement of the right lobe
of the liver. The right portal vein is not visualized and may be
thrombosed. Multiple clots were seen in the main portal vein on the
prior CT. The main portal vein and left portal vein are patent.
Cholecystectomy. Mild biliary ductal dilatation, likely post
cholecystectomy. No retained calcified stone noted in the central
CBD.

Pancreas: The pancreas is grossly unremarkable.

Spleen: Mildly enlarged spleen measuring up to 15 cm in length.
Focal area of scarring or infarct along the lateral upper pole of
the spleen.

Adrenals/Urinary Tract: The adrenal glands are unremarkable. Mild
bilateral renal parenchyma atrophy with cortical irregularity and
scarring. There is no hydronephrosis on either side. The urinary
bladder is grossly unremarkable.

Stomach/Bowel: Status post Roux-en-Y gastric bypass. Large amount of
stool throughout the colon. No evidence of bowel obstruction. The
appendix is normal.

Vascular/Lymphatic: Moderate aortoiliac atherosclerotic disease. The
IVC is grossly unremarkable. The splenic vein is not visualized and
may be thrombosed. Collateral vessels noted from the spleen to the
portal vein in the anterior abdomen. No portal venous gas. No
obvious adenopathy.

Reproductive: The uterus is anteverted.

Other: Diffuse mesenteric edema and anasarca.  Cachexia.

Musculoskeletal: Degenerative changes of the spine. No acute osseous
pathology.

Review of the MIP images confirms the above findings.
IMPRESSION: 1. Linear and bandlike nonocclusive density in the right lower lobe
pulmonary artery branch likely scarring and sequela of prior PE.
Acute PE is less likely.
2. Interval complete resolution of the previously seen pleural
effusion.
3. Scattered clusters of nodular and streaky densities predominantly
involving the upper lobes may represent residual infection or
sequela of prior infection. Recurrent pneumonia is not excluded.
4. Apparent diffuse thickening of the esophagus which may be related
to anasarca. Clinical correlation is recommended to evaluate for
esophagitis.
5. Heterogeneous enhancement of the right lobe of the liver. The
right portal vein is not visualized and may be thrombosed. Multiple
clots were seen in the main portal vein on the prior CT.
6. The splenic vein is not visualized and appears thrombosed.
Multiple collaterals noted in the anterior abdomen.
7. Overall interval decrease in the mesenteric edema compared to
prior CT.
8. Large amount of stool throughout the colon. No evidence of bowel
obstruction. Normal appendix.
9. Aortic Atherosclerosis (ADFO9-3IQ.Q) and Emphysema ([id number].F).

## 2022-06-15 IMAGING — CT CT ABD-PELV W/ CM
2 of 5 series · 13 of 46 positions shown, 15 images · IV contrast (APPLIED)
Comparison: Chest CT dated 09/18/2021. CT abdomen pelvis dated
08/31/2021.

CLINICAL DATA: Epigastric pain. Concern for pulmonary embolism and
bowel obstruction. Recent diagnosis of liver abscess.



[Series 4: thins · axial · 0.70mm/px · z∈[+634,+1026]mm · 10 of 468 slices shown, 12 images]
[im 39/468  soft-tissue]
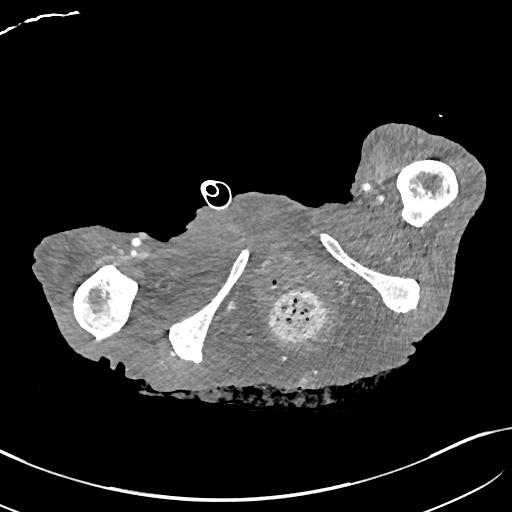
[im 39/468  bone]
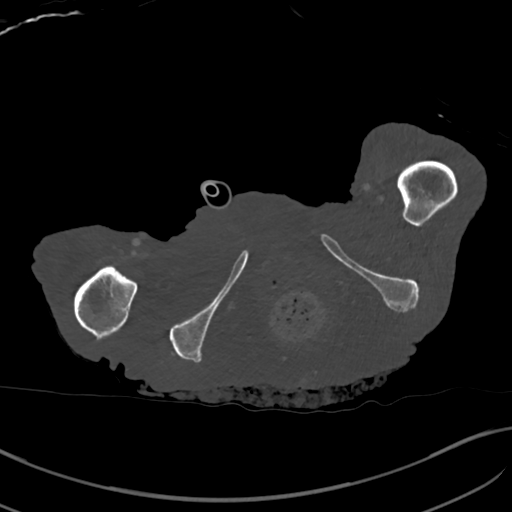
[im 78/468  soft-tissue]
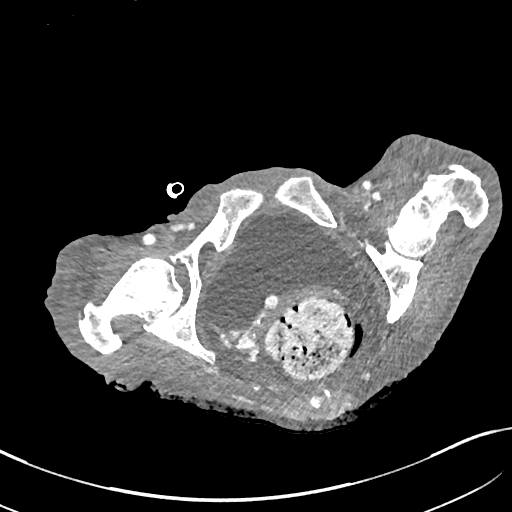
[im 117/468  soft-tissue]
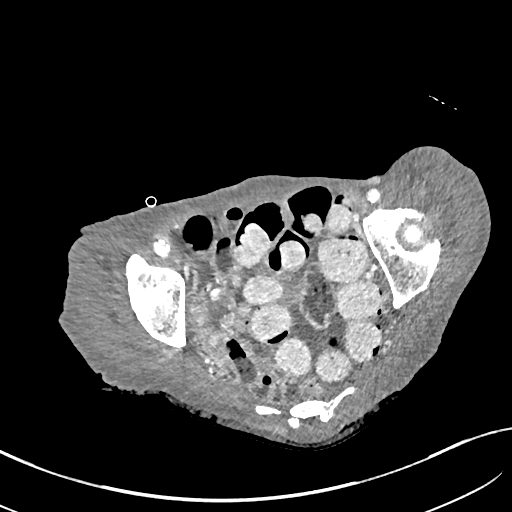
[im 176/468  soft-tissue]
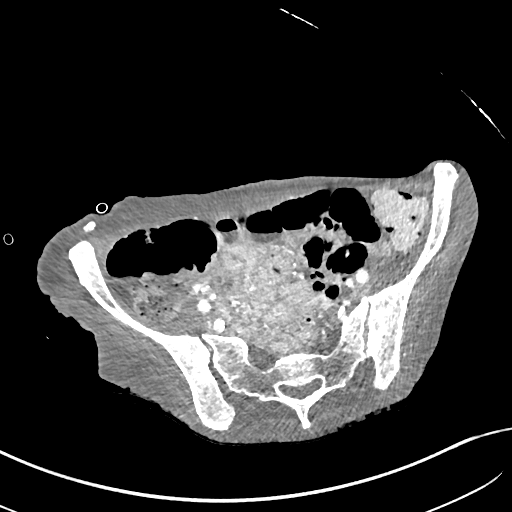
[im 215/468  soft-tissue]
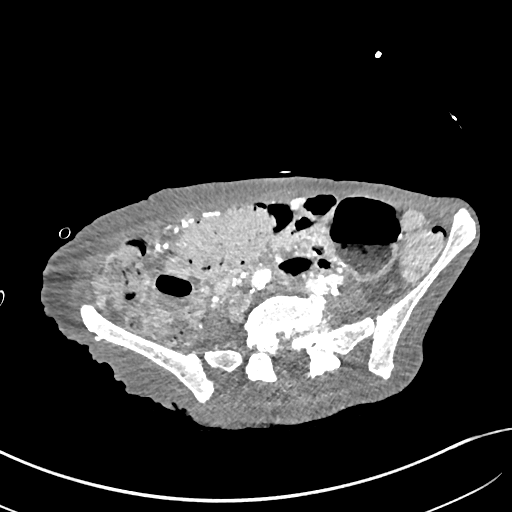
[im 253/468  soft-tissue]
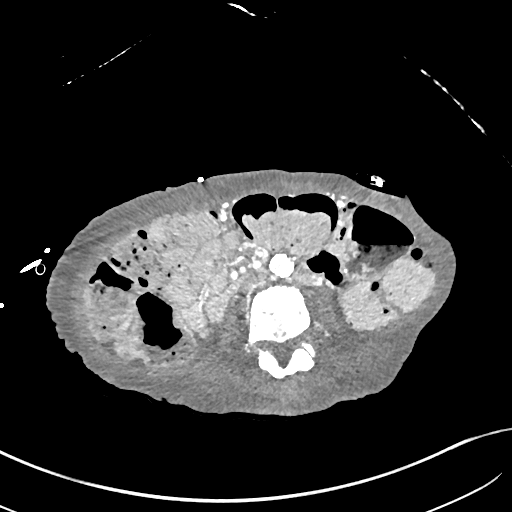
[im 292/468  soft-tissue]
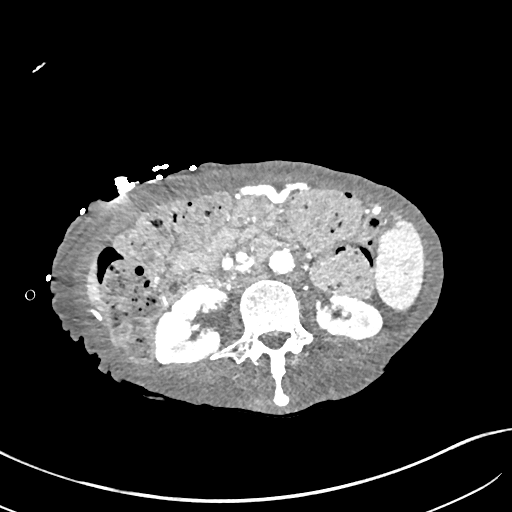
[im 351/468  soft-tissue]
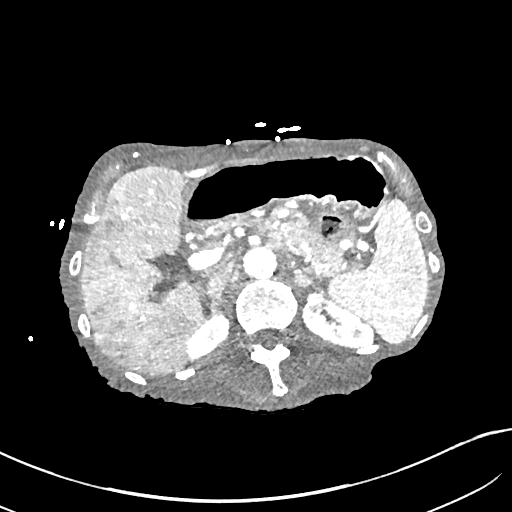
[im 390/468  soft-tissue]
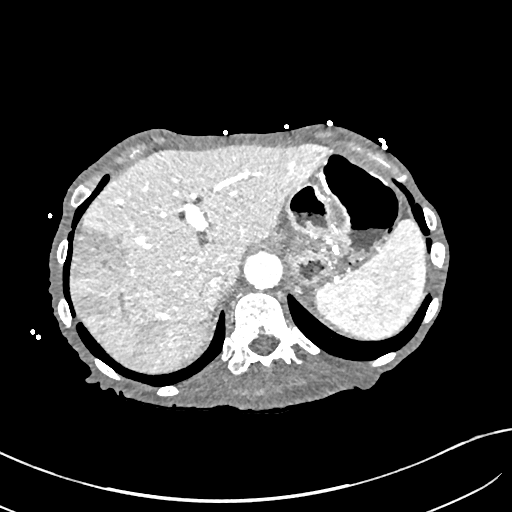
[im 390/468  bone]
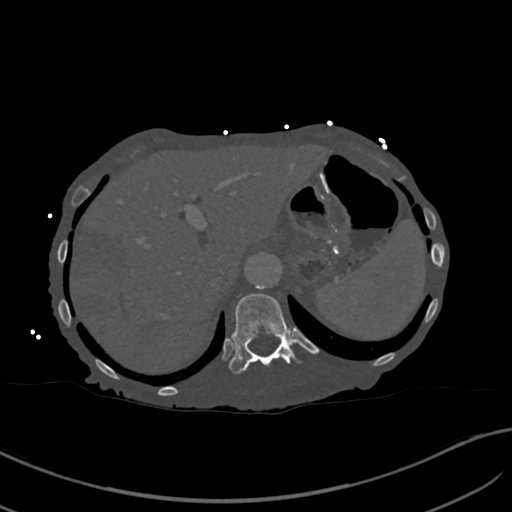
[im 429/468  soft-tissue]
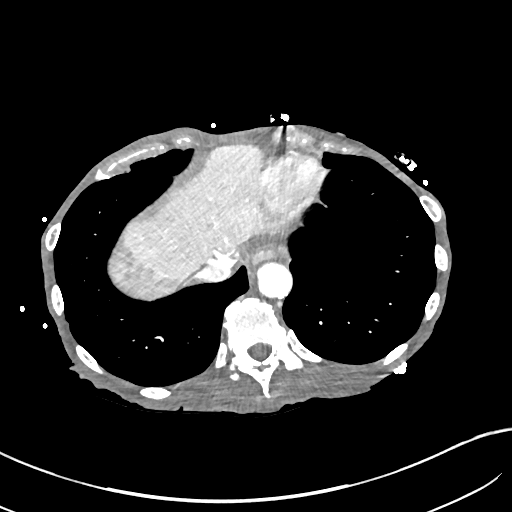

[Series 6: coronals · coronal · 0.82mm/px · 3 of 78 slices shown]
[im 26/78  soft-tissue]
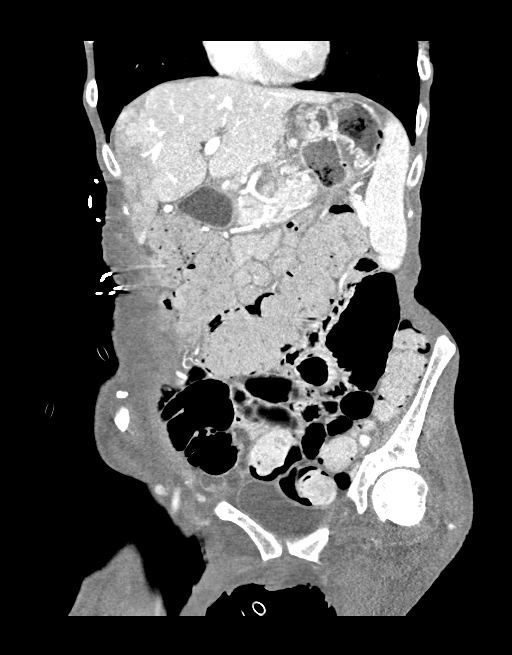
[im 35/78  soft-tissue]
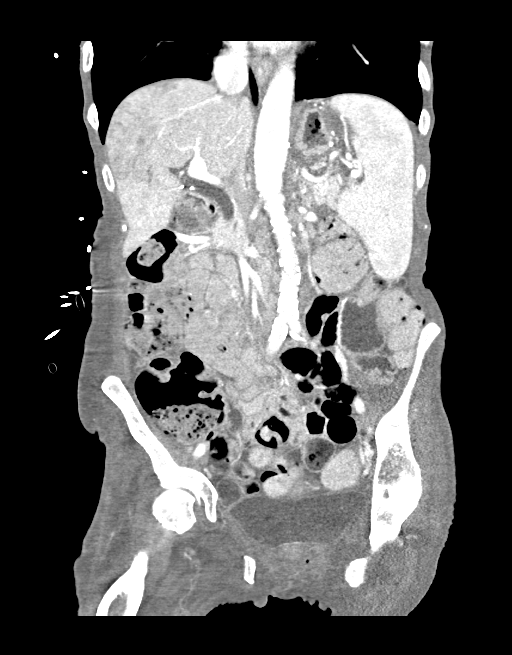
[im 43/78  soft-tissue]
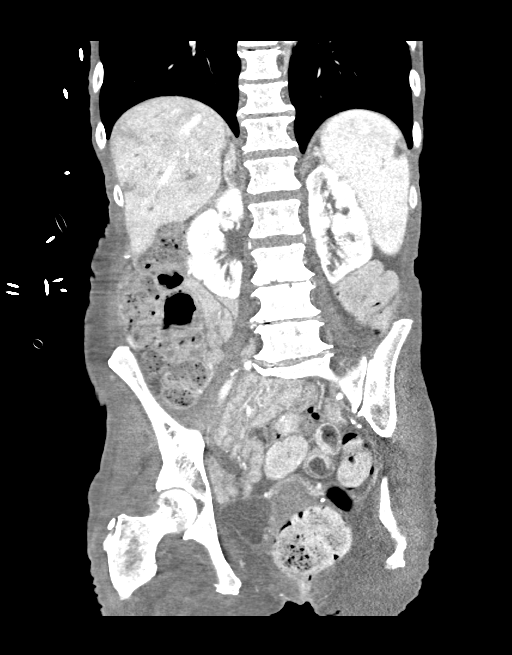

[13 of 46 positions shown; findings below may reference images not displayed]

RADIATION DOSE REDUCTION: This exam was performed according to the
departmental dose-optimization program which includes automated
exposure control, adjustment of the mA and/or kV according to
patient size and/or use of iterative reconstruction technique.

CONTRAST:  100mL OMNIPAQUE IOHEXOL 350 MG/ML SOLN
FINDINGS: Evaluation is limited due to anasarca and cachexia.

CTA CHEST FINDINGS

Cardiovascular: There is no cardiomegaly or pericardial effusion.
Mild atherosclerotic calcification of the thoracic aorta. No
aneurysmal dilatation or dissection. Linear and bandlike
nonocclusive density in the right lower lobe pulmonary artery branch
(210/10) likely scarring and sequela prior PE. Acute PE is less
likely. Clinical correlation is recommended. No other pulmonary
artery embolus identified.

Mediastinum/Nodes: No obvious hilar or mediastinal adenopathy.
Evaluation however is limited due to anasarca and cachexia. Apparent
diffuse thickening of the esophagus which may be related to
anasarca. Clinical correlation is recommended to evaluate for
esophagitis. No mediastinal fluid collection.

Lungs/Pleura: Background of emphysema. Scattered clusters of nodular
and streaky densities predominantly involving the upper lobes may
represent residual infection or sequela of prior infection.
Recurrent pneumonia is not excluded overall significant interval
improvement in pulmonary opacities compared to prior CT and
significant improvement in aeration of the lungs. Interval complete
resolution of the previously seen pleural effusion. No pneumothorax.
The central airways are patent.

Musculoskeletal: Diffuse subcutaneous edema and anasarca. Loss of
subcutaneous fat and cachexia. No acute osseous pathology. Old
fracture of lateral left ninth rib.

Review of the MIP images confirms the above findings.

CT ABDOMEN and PELVIS FINDINGS

Tiny linear pockets of air along the anterior wall of the stomach
(axial [DATE]), likely intraluminal. Small pneumoperitoneum is less
likely but difficult to exclude given cachexia. Overall interval
decrease in the mesenteric edema compared to prior CT.

Hepatobiliary: There is heterogeneous enhancement of the right lobe
of the liver. The right portal vein is not visualized and may be
thrombosed. Multiple clots were seen in the main portal vein on the
prior CT. The main portal vein and left portal vein are patent.
Cholecystectomy. Mild biliary ductal dilatation, likely post
cholecystectomy. No retained calcified stone noted in the central
CBD.

Pancreas: The pancreas is grossly unremarkable.

Spleen: Mildly enlarged spleen measuring up to 15 cm in length.
Focal area of scarring or infarct along the lateral upper pole of
the spleen.

Adrenals/Urinary Tract: The adrenal glands are unremarkable. Mild
bilateral renal parenchyma atrophy with cortical irregularity and
scarring. There is no hydronephrosis on either side. The urinary
bladder is grossly unremarkable.

Stomach/Bowel: Status post Roux-en-Y gastric bypass. Large amount of
stool throughout the colon. No evidence of bowel obstruction. The
appendix is normal.

Vascular/Lymphatic: Moderate aortoiliac atherosclerotic disease. The
IVC is grossly unremarkable. The splenic vein is not visualized and
may be thrombosed. Collateral vessels noted from the spleen to the
portal vein in the anterior abdomen. No portal venous gas. No
obvious adenopathy.

Reproductive: The uterus is anteverted.

Other: Diffuse mesenteric edema and anasarca.  Cachexia.

Musculoskeletal: Degenerative changes of the spine. No acute osseous
pathology.

Review of the MIP images confirms the above findings.
IMPRESSION: 1. Linear and bandlike nonocclusive density in the right lower lobe
pulmonary artery branch likely scarring and sequela of prior PE.
Acute PE is less likely.
2. Interval complete resolution of the previously seen pleural
effusion.
3. Scattered clusters of nodular and streaky densities predominantly
involving the upper lobes may represent residual infection or
sequela of prior infection. Recurrent pneumonia is not excluded.
4. Apparent diffuse thickening of the esophagus which may be related
to anasarca. Clinical correlation is recommended to evaluate for
esophagitis.
5. Heterogeneous enhancement of the right lobe of the liver. The
right portal vein is not visualized and may be thrombosed. Multiple
clots were seen in the main portal vein on the prior CT.
6. The splenic vein is not visualized and appears thrombosed.
Multiple collaterals noted in the anterior abdomen.
7. Overall interval decrease in the mesenteric edema compared to
prior CT.
8. Large amount of stool throughout the colon. No evidence of bowel
obstruction. Normal appendix.
9. Aortic Atherosclerosis ([id number].Q) and Emphysema ([id number].F).

## 2022-08-11 ENCOUNTER — Emergency Department (HOSPITAL_COMMUNITY): Payer: Medicaid Other

## 2022-08-11 ENCOUNTER — Encounter (HOSPITAL_COMMUNITY): Payer: Self-pay

## 2022-08-11 ENCOUNTER — Other Ambulatory Visit: Payer: Self-pay

## 2022-08-11 ENCOUNTER — Emergency Department (HOSPITAL_COMMUNITY)
Admission: EM | Admit: 2022-08-11 | Discharge: 2022-08-11 | Disposition: A | Discharge status: Home / Self Care | Payer: Medicaid Other | Attending: Student | Admitting: Student

## 2022-08-11 DIAGNOSIS — F17210 Nicotine dependence, cigarettes, uncomplicated: Secondary | ICD-10-CM | POA: Diagnosis not present

## 2022-08-11 DIAGNOSIS — R0689 Other abnormalities of breathing: Secondary | ICD-10-CM | POA: Diagnosis not present

## 2022-08-11 DIAGNOSIS — Z8616 Personal history of COVID-19: Secondary | ICD-10-CM | POA: Insufficient documentation

## 2022-08-11 DIAGNOSIS — E876 Hypokalemia: Secondary | ICD-10-CM | POA: Diagnosis not present

## 2022-08-11 DIAGNOSIS — R112 Nausea with vomiting, unspecified: Secondary | ICD-10-CM | POA: Diagnosis not present

## 2022-08-11 DIAGNOSIS — I10 Essential (primary) hypertension: Secondary | ICD-10-CM | POA: Insufficient documentation

## 2022-08-11 DIAGNOSIS — R079 Chest pain, unspecified: Secondary | ICD-10-CM | POA: Diagnosis not present

## 2022-08-11 DIAGNOSIS — Z79899 Other long term (current) drug therapy: Secondary | ICD-10-CM | POA: Diagnosis not present

## 2022-08-11 DIAGNOSIS — R109 Unspecified abdominal pain: Secondary | ICD-10-CM | POA: Insufficient documentation

## 2022-08-11 LAB — CBC WITH DIFFERENTIAL/PLATELET
Abs Immature Granulocytes: 0.02 10*3/uL (ref 0.00–0.07)
Basophils Absolute: 0 10*3/uL (ref 0.0–0.1)
Basophils Relative: 1 %
Eosinophils Absolute: 0.1 10*3/uL (ref 0.0–0.5)
Eosinophils Relative: 1 %
HCT: 35.7 % — ABNORMAL LOW (ref 36.0–46.0)
Hemoglobin: 12.5 g/dL (ref 12.0–15.0)
Immature Granulocytes: 0 %
Lymphocytes Relative: 23 %
Lymphs Abs: 1.5 10*3/uL (ref 0.7–4.0)
MCH: 26.5 pg (ref 26.0–34.0)
MCHC: 35 g/dL (ref 30.0–36.0)
MCV: 75.8 fL — ABNORMAL LOW (ref 80.0–100.0)
Monocytes Absolute: 0.4 10*3/uL (ref 0.1–1.0)
Monocytes Relative: 6 %
Neutro Abs: 4.7 10*3/uL (ref 1.7–7.7)
Neutrophils Relative %: 69 %
Platelets: 226 10*3/uL (ref 150–400)
RBC: 4.71 MIL/uL (ref 3.87–5.11)
RDW: 18.1 % — ABNORMAL HIGH (ref 11.5–15.5)
WBC: 6.7 10*3/uL (ref 4.0–10.5)
nRBC: 0 % (ref 0.0–0.2)

## 2022-08-11 LAB — COMPREHENSIVE METABOLIC PANEL
ALT: 14 U/L (ref 0–44)
AST: 18 U/L (ref 15–41)
Albumin: 3.5 g/dL (ref 3.5–5.0)
Alkaline Phosphatase: 163 U/L — ABNORMAL HIGH (ref 38–126)
Anion gap: 10 (ref 5–15)
BUN: 13 mg/dL (ref 6–20)
CO2: 25 mmol/L (ref 22–32)
Calcium: 9.5 mg/dL (ref 8.9–10.3)
Chloride: 102 mmol/L (ref 98–111)
Creatinine, Ser: 1.07 mg/dL — ABNORMAL HIGH (ref 0.44–1.00)
GFR, Estimated: >60 mL/min (ref >60)
Glucose, Bld: 96 mg/dL (ref 70–99)
Potassium: 3.3 mmol/L — ABNORMAL LOW (ref 3.5–5.1)
Sodium: 137 mmol/L (ref 135–145)
Total Bilirubin: 0.5 mg/dL (ref 0.3–1.2)
Total Protein: 6.9 g/dL (ref 6.5–8.1)

## 2022-08-11 LAB — BRAIN NATRIURETIC PEPTIDE: B Natriuretic Peptide: 5.7 pg/mL (ref 0.0–100.0)

## 2022-08-11 LAB — TROPONIN I (HIGH SENSITIVITY)
Troponin I (High Sensitivity): 4 ng/L (ref <18)
Troponin I (High Sensitivity): 5 ng/L (ref <18)

## 2022-08-11 LAB — LIPASE, BLOOD: Lipase: 36 U/L (ref 11–51)

## 2022-08-11 LAB — PREGNANCY, URINE: Preg Test, Ur: NEGATIVE

## 2022-08-11 MED ORDER — DROPERIDOL 2.5 MG/ML IJ SOLN
1.2500 mg | Freq: Once | INTRAMUSCULAR | Status: AC
  Administered 2022-08-11: 1.25 mg via INTRAVENOUS
  Filled 2022-08-11: qty 2

## 2022-08-11 MED ORDER — ALUM & MAG HYDROXIDE-SIMETH 200-200-20 MG/5ML PO SUSP
30.0000 mL | Freq: Once | ORAL | Status: AC
  Administered 2022-08-11: 30 mL via ORAL
  Filled 2022-08-11: qty 30

## 2022-08-11 MED ORDER — IOHEXOL 350 MG/ML SOLN
75.0000 mL | Freq: Once | INTRAVENOUS | Status: AC | PRN
  Administered 2022-08-11: 75 mL via INTRAVENOUS

## 2022-08-11 MED ORDER — LORAZEPAM 2 MG/ML IJ SOLN
0.5000 mg | Freq: Once | INTRAMUSCULAR | Status: AC
  Administered 2022-08-11: 0.5 mg via INTRAVENOUS
  Filled 2022-08-11: qty 1

## 2022-08-11 MED ORDER — FENTANYL CITRATE PF 50 MCG/ML IJ SOSY
50.0000 ug | PREFILLED_SYRINGE | Freq: Once | INTRAMUSCULAR | Status: AC
  Administered 2022-08-11: 50 ug via INTRAVENOUS
  Filled 2022-08-11: qty 1

## 2022-08-11 MED ORDER — NYSTATIN 100000 UNIT/GM EX POWD
Freq: Once | CUTANEOUS | Status: DC
  Filled 2022-08-11: qty 15

## 2022-08-11 MED ORDER — NYSTATIN 100000 UNIT/GM EX POWD: 1.0000 | Freq: Three times a day (TID) | CUTANEOUS | 0 refills | Status: AC

## 2022-08-11 MED ORDER — LIDOCAINE VISCOUS HCL 2 % MT SOLN
15.0000 mL | Freq: Once | OROMUCOSAL | Status: AC
  Administered 2022-08-11: 15 mL via ORAL
  Filled 2022-08-11: qty 15

## 2022-08-11 NOTE — ED Notes (Signed)
Brought patient a warm blanket, pillow, and new emesis bag.

## 2022-08-11 NOTE — ED Provider Notes (Signed)
[facility] Provider Note  CSN: [id number] Arrival date & time: 08/11/22 1453  Chief Complaint(s) Chest Pain  HPI Terri [name] is a 47 y.o. 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  States that over the last 4 days she has had inability to tolerate p.o. secondary to nausea and vomiting, epigastric pain that radiates to the shoulders.  Denies shortness of breath, headache, fever or other systemic symptoms.   Past Medical History Past Medical History:  Diagnosis Date   Arthritis    Back and legs   Chronic back pain    DVT (deep venous thrombosis) (HCC)    early 20's leg   GIB (gastrointestinal bleeding) 09/13/2017   History of blood transfusion    Hypertension    Migraine headache    Mood disorder ([place])    previously documented as bipolar and MDD   Neuropathy    Pneumonia    Positive PPD    x 2 last time 09/2017   Sciatica    Stress incontinence    Patient Active Problem List   Diagnosis Date Noted   Pressure injury of skin 10/08/2021   Drug-seeking behavior 10/06/2021   Goals of care, counseling/discussion    Protein-calorie malnutrition, severe 09/21/2021   Involuntary commitment 09/19/2021   Acute respiratory failure with hypoxia ([place]) 09/15/2021   Dysuria 09/15/2021   Vaginal discharge 09/14/2021   Exudative pleural effusion 09/12/2021   Hypophosphatemia 09/12/2021   Ascites 09/11/2021   Hepatic abscess    Acute back pain    Swelling of left hand    Disseminated MSSA with liver abscess, left foot osteo and abscess, 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    Candida infection, esophageal ([place]) 09/03/2021   Portal vein thrombosis 08/31/2021   Community  acquired pneumonia 07/08/2021   Homelessness 07/08/2021   Multilobar pneumonia with acute hypoxic respiratory failure ([place])    Cellulitis of right lower extremity 05/28/2021   Vitamin B12 deficiency 05/24/2021   Cellulitis 05/22/2021   COVID-19 virus infection 05/22/2021   Overdose 04/13/2021   Hypoxemia 04/13/2021   Hypoglycemia 04/13/2021   Asthma exacerbation 04/01/2021   Acute on chronic blood loss anemia 04/01/2021   Iron deficiency anemia 03/18/2021   Hyponatremia 03/18/2021   Marginal ulcer 03/15/2021   Microcytic anemia 02/26/2021   Mild protein malnutrition ([place]) 02/26/2021   Polysubstance abuse ([place]) 01/16/2021   Abdominal pain 01/14/2021   Ulcer at site of surgical anastomosis following bypass of stomach 01/13/2021   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 coli) ([place]) 02/11/2019   AKI (acute kidney injury) ([place])    Peptic ulcer disease 02/09/2019   S/P gastric bypass 02/09/2019   Persistent fever 02/09/2019   COVID-19 virus not detected    Chest pain    Bacteremia due to Escherichia coli    Esophagitis    Hypokalemia    Hypomagnesemia    Sepsis ([place]) 02/02/2019   Leucocytosis 02/02/2019   Thrombocytosis 02/02/2019   Acute lower UTI 02/02/2019   Apnea 09/13/2017   Essential hypertension  Cellulitis and abscess of neck 08/12/2017   Cellulitis of submandibular region 08/10/2017   Odontogenic infection of jaw 08/10/2017   Cholecystitis 02/14/2017   Opioid abuse with opioid-induced mood disorder ([place]) 01/25/2017   Anxiety 02/11/2014   Depression 07/12/2012   Home Medication(s) Prior to Admission medications   Medication Sig Start Date End Date Taking? Authorizing Provider  nystatin (MYCOSTATIN/NYSTOP)  powder Apply 1 Application topically 3 (three) times daily. 08/11/22  Yes Addalynne Golding, MD  calcium carbonate (TUMS - DOSED IN MG ELEMENTAL CALCIUM) 500 MG chewable tablet Chew 1 tablet (200 mg of elemental calcium total) by mouth 3 (three) times daily. 10/12/21   Amin, Jeanella Flattery, MD  cloNIDine (CATAPRES - DOSED IN MG/24 HR) 0.1 mg/24hr patch Place 1 patch (0.1 mg total) onto the skin once a week. 10/16/21   Amin, Jeanella Flattery, MD  cyclobenzaprine (FLEXERIL) 5 MG tablet Take 1 tablet (5 mg total) by mouth 3 (three) times daily as needed for muscle spasms. 10/12/21   Amin, Jeanella Flattery, MD  dronabinol (MARINOL) 5 MG capsule Take 1 capsule (5 mg total) by mouth 2 (two) times daily before lunch and supper. 10/12/21   Amin, Jeanella Flattery, MD  folic acid (FOLVITE) 1 MG tablet Take 1 tablet (1 mg total) by mouth daily. 10/13/21   Amin, Jeanella Flattery, MD  HYDROcodone-acetaminophen (NORCO) 7.5-325 MG tablet Take 1 tablet by mouth every 6 (six) hours as needed for moderate pain or severe pain. 10/12/21   Amin, Jeanella Flattery, MD  lisinopril (ZESTRIL) 10 MG tablet Take 1 tablet (10 mg total) by mouth at bedtime. 10/12/21   Amin, Jeanella Flattery, MD  pantoprazole (PROTONIX) 40 MG tablet Take 1 tablet (40 mg total) by mouth daily before breakfast. 10/12/21   Amin, Jeanella Flattery, MD  thiamine 100 MG tablet Take 1 tablet (100 mg total) by mouth daily. 10/13/21   Damita Lack, MD                                                                                                                                    Past Surgical History Past Surgical History:  Procedure Laterality Date   ALVEOLOPLASTY Bilateral 01/31/2018   Procedure: ALVEOLOPLASTY;  Surgeon: Diona Browner, DDS;  Location: [facility];  Service: Oral Surgery;  Laterality: Bilateral;   AMPUTATION Left 09/16/2021   Procedure: AMPUTATION OF LEFT GREAT TOE;  Surgeon: Armond Hang, MD;  Location: [facility];  Service: Orthopedics;  Laterality: Left;   ANKLE SURGERY Left  1992   with hardware   BACK SURGERY     BIOPSY  02/08/2019   Procedure: BIOPSY;  Surgeon: Rush Landmark Telford Nab., MD;  Location: [facility];  Service: Gastroenterology;;   CESAREAN SECTION     CHOLECYSTECTOMY N/A 02/15/2017   Procedure: LAPAROSCOPIC CHOLECYSTECTOMY;  Surgeon: Clovis Riley, MD;  Location: [facility];  Service: General;  Laterality: N/A;   ENTEROSCOPY N/A 02/08/2019   Procedure: ENTEROSCOPY;  Surgeon: Irving Copas., MD;  Location[facility];  Service: Gastroenterology;  Laterality: N/A;   ESOPHAGOGASTRODUODENOSCOPY N/A 01/17/2021   Procedure: ESOPHAGOGASTRODUODENOSCOPY (EGD);  Surgeon: Juanita Craver, MD;  Location: [facility];  Service: Endoscopy;  Laterality: N/A;   ESOPHAGOGASTRODUODENOSCOPY N/A 09/03/2021   Procedure: ESOPHAGOGASTRODUODENOSCOPY (EGD);  Surgeon: Arta Silence, MD;  Location: [facility];  Service: Gastroenterology;  Laterality: N/A;   ESOPHAGOGASTRODUODENOSCOPY (EGD) WITH PROPOFOL N/A 09/15/2017   Procedure: ESOPHAGOGASTRODUODENOSCOPY (EGD) WITH PROPOFOL;  Surgeon: Clarene Essex, MD;  [facility];  Service: Endoscopy;  Laterality: N/A;   ESOPHAGOGASTRODUODENOSCOPY (EGD) WITH PROPOFOL N/A 02/08/2019   Procedure: ESOPHAGOGASTRODUODENOSCOPY (EGD) WITH PROPOFOL;  Surgeon: Rush Landmark Telford Nab., MD;  [facility];  Service: Gastroenterology;  Laterality: N/A;   GASTRIC BYPASS     INCISION AND DRAINAGE OF PERITONSILLAR ABCESS Left 08/13/2017   Procedure: INCISION AND DRAINAGE OF LEFT NECK ABSCESS;  Surgeon: Melida Quitter, MD;  [facility];  Service: ENT;  Laterality: Left;   LAPAROSCOPIC LYSIS OF ADHESIONS  02/15/2017   Procedure: LAPAROSCOPIC LYSIS OF ADHESIONS;  Surgeon: Clovis Riley, MD;  [facility];  Service: General;;   LUMBAR DISC SURGERY     LUMBAR FUSION     SAVORY DILATION N/A 02/08/2019   Procedure: SAVORY DILATION;  Surgeon: Irving Copas., MD;  [facility];  Service: Gastroenterology;   Laterality: N/A;   TOOTH EXTRACTION Bilateral 01/31/2018   Procedure: DENTAL RESTORATION/EXTRACTIONS;  Surgeon: Diona Browner, DDS;  Location: [facility];  Service: Oral Surgery;  Laterality: Bilateral;   TUBAL LIGATION     UPPER GI ENDOSCOPY N/A 02/15/2017   Procedure: UPPER GI ENDOSCOPY;  Surgeon: Clovis Riley, MD;  [facility];  Service: General;  Laterality: N/A;   Family History Family History  Problem Relation Age of Onset   Diabetes Mother    Hypertension Mother    Diabetes Father    Hypertension Father     Social History Social History   Tobacco Use   Smoking status: Every Day    Packs/day: 1.00    Years: 29.00    Additional pack years: 0.00    Total pack years: 29.00    Types: Cigarettes   Smokeless tobacco: Never  Vaping Use   Vaping Use: Never used  Substance Use Topics   Alcohol use: No   Drug use: Yes    Types: Cocaine, Heroin    Comment: Patient states she smokes cocaine   Allergies Nsaids and Ketorolac tromethamine  Review of Systems Review of Systems  Cardiovascular:  Positive for chest pain.  Gastrointestinal:  Positive for abdominal pain, nausea and vomiting.    Physical Exam Vital Signs  I have reviewed the triage vital signs BP (!) 124/91   Pulse 65   Temp 98 F (36.7 C) (Oral)   Resp 15   Ht 5\' 10"  (1.778 m)   Wt 97.5 kg   LMP  (LMP Unknown)   SpO2 98%   BMI 30.85 kg/m   Physical Exam Vitals and nursing note reviewed.  Constitutional:      General: She is not in acute distress.    Appearance: She is well-developed. She is ill-appearing.  HENT:     Head: Normocephalic and atraumatic.  Eyes:     Conjunctiva/sclera: Conjunctivae normal.  Cardiovascular:     Rate and Rhythm: Normal rate and regular rhythm.     Heart sounds: No murmur heard. Pulmonary:     Effort: Pulmonary effort is normal. No respiratory distress.  Breath sounds: Normal breath sounds.  Abdominal:     Palpations: Abdomen is soft.     Tenderness: There  is abdominal tenderness.  Musculoskeletal:        General: No swelling.     Cervical back: Neck supple.  Skin:    General: Skin is warm and dry.     Capillary Refill: Capillary refill takes less than 2 seconds.  Neurological:     Mental Status: She is alert.  Psychiatric:        Mood and Affect: Mood normal.     ED Results and Treatments Labs (all labs ordered are listed, but only abnormal results are displayed) Labs Reviewed  COMPREHENSIVE METABOLIC PANEL - Abnormal; Notable for the following components:      Result Value   Potassium 3.3 (*)    Creatinine, Ser 1.07 (*)    Alkaline Phosphatase 163 (*)    All other components within normal limits  CBC WITH DIFFERENTIAL/PLATELET - Abnormal; Notable for the following components:   HCT 35.7 (*)    MCV 75.8 (*)    RDW 18.1 (*)    All other components within normal limits  BRAIN NATRIURETIC PEPTIDE  LIPASE, BLOOD  PREGNANCY, URINE  TROPONIN I (HIGH SENSITIVITY)  TROPONIN I (HIGH SENSITIVITY)                                                                                                                          Radiology CT ABDOMEN PELVIS W CONTRAST  Result Date: 08/11/2022 CLINICAL DATA:  Epigastric pain. EXAM: CT ABDOMEN AND PELVIS WITH CONTRAST TECHNIQUE: Multidetector CT imaging of the abdomen and pelvis was performed using the standard protocol following bolus administration of intravenous contrast. RADIATION DOSE REDUCTION: This exam was performed according to the departmental dose-optimization program which includes automated exposure control, adjustment of the mA and/or kV according to patient size and/or use of iterative reconstruction technique. CONTRAST:  27mL OMNIPAQUE IOHEXOL 350 MG/ML SOLN COMPARISON:  December 17, 2021 FINDINGS: Lower chest: No acute abnormality. Hepatobiliary: No focal liver abnormality is seen. Status post cholecystectomy. No biliary dilatation. Pancreas: Unremarkable. No pancreatic ductal dilatation or  surrounding inflammatory changes. Spleen: The spleen is elongated, without focal abnormality. Adrenals/Urinary Tract: Adrenal glands are unremarkable. Kidneys are lobulated in appearance, without renal calculi, focal lesion, or hydronephrosis. The urinary bladder is poorly distended and subsequently limited in evaluation. Stomach/Bowel: Surgical sutures are seen within a poorly distended gastric body and gastric antrum. While gastric wall thickening is also seen within this region. Surgically anastomosed bowel is also seen within the posterior aspect of the left lower quadrant. Appendix appears normal. No evidence of bowel wall thickening, distention, or inflammatory changes. Vascular/Lymphatic: Aortic atherosclerosis. Subcentimeter para-aortic and aortocaval lymph nodes are noted, with additional small mesenteric lymph nodes seen along the midline of the mid and upper abdomen. Reproductive: Uterus and bilateral adnexa are unremarkable. Other: No abdominal wall hernia or abnormality. No abdominopelvic ascites. Musculoskeletal: Multilevel degenerative changes seen throughout the  lumbar spine. IMPRESSION: 1. Evidence of prior gastric bypass surgery and cholecystectomy. 2. Mild gastric wall thickening which may be, in part, secondary to poor gastric distension. Mild gastritis cannot be excluded. 3. Aortic atherosclerosis. Aortic Atherosclerosis (ICD10-I70.0). Electronically Signed   By: Virgina Norfolk M.D.   On: 08/11/2022 20:33   DG Chest 2 View  Result Date: 08/11/2022 CLINICAL DATA:  Left-sided chest pain. EXAM: CHEST - 2 VIEW COMPARISON:  February 06, 2022 FINDINGS: The heart size and mediastinal contours are within normal limits. Both lungs are clear. The visualized skeletal structures are unremarkable. IMPRESSION: No active cardiopulmonary disease. Electronically Signed   By: Virgina Norfolk M.D.   On: 08/11/2022 15:44    Pertinent labs & imaging results that were available during my care of the  patient were reviewed by me and considered in my medical decision making (see MDM for details).  Medications Ordered in ED Medications  droperidol (INAPSINE) 2.5 MG/ML injection 1.25 mg (1.25 mg Intravenous Given 08/11/22 1538)  LORazepam (ATIVAN) injection 0.5 mg (0.5 mg Intravenous Given 08/11/22 1540)  fentaNYL (SUBLIMAZE) injection 50 mcg (50 mcg Intravenous Given 08/11/22 1952)  iohexol (OMNIPAQUE) 350 MG/ML injection 75 mL (75 mLs Intravenous Contrast Given 08/11/22 2021)  alum & mag hydroxide-simeth (MAALOX/MYLANTA) 200-200-20 MG/5ML suspension 30 mL (30 mLs Oral Given 08/11/22 2052)    And  lidocaine (XYLOCAINE) 2 % viscous mouth solution 15 mL (15 mLs Oral Given 08/11/22 2052)                                                                                                                                     Procedures Procedures  (including critical care time)  Medical Decision Making / ED Course   This patient presents to the ED for concern of abdominal pain, this involves an extensive number of treatment options, and is a complaint that carries with it a high risk of complications and morbidity.  The differential diagnosis includes gastritis, marginal ulcer, liver abscess, pancreatitis, gastroparesis, cholecystitis  MDM: Patient seen emergency room for evaluation of abdominal pain radiating to the chest.  Physical exam with epigastric and right upper quadrant tenderness to palpation but otherwise unremarkable.  Laboratory evaluation with a mild hypokalemia to 3.3 but is otherwise unremarkable.  No second leukocytosis.  High sensitive troponin is negative.  Chest x-ray unremarkable.  CT abdomen pelvis with mild gastric wall thickening but is otherwise unremarkable.  I did speak with the gastroenterology team at [facility] and her recent EGD did not show significant acute abnormalities including no new marginal ulcers.  Patient pain controlled in the emergency department and was  ultimately able to tolerate p.o. without difficulty.  Regards the patient's chest pain, as abdominal pain was addressed, chest pain also resolved.  ECG is nonischemic and I have very low suspicion for ACS or PE at this time.  Low risk by Wells criteria and heart score less than  4.  Patient then discharged with outpatient follow-up.   Additional history obtained:  -External records from outside source obtained and reviewed including: Chart review including previous notes, labs, imaging, consultation notes   Lab Tests: -I ordered, reviewed, and interpreted labs.   The pertinent results include:   Labs Reviewed  COMPREHENSIVE METABOLIC PANEL - Abnormal; Notable for the following components:      Result Value   Potassium 3.3 (*)    Creatinine, Ser 1.07 (*)    Alkaline Phosphatase 163 (*)    All other components within normal limits  CBC WITH DIFFERENTIAL/PLATELET - Abnormal; Notable for the following components:   HCT 35.7 (*)    MCV 75.8 (*)    RDW 18.1 (*)    All other components within normal limits  BRAIN NATRIURETIC PEPTIDE  LIPASE, BLOOD  PREGNANCY, URINE  TROPONIN I (HIGH SENSITIVITY)  TROPONIN I (HIGH SENSITIVITY)      EKG   EKG Interpretation  Date/Time:  Wednesday August 11 2022 14:58:58 EDT Ventricular Rate:  84 PR Interval:  159 QRS Duration: 86 QT Interval:  370 QTC Calculation: 438 R Axis:   78 Text Interpretation: Sinus rhythm Consider right atrial enlargement Confirmed by [place] ([id number]) on 08/11/2022 3:21:38 PM         Imaging Studies ordered: I ordered imaging studies including CT abdomen pelvis, chest x-ray I independently visualized and interpreted imaging. I agree with the radiologist interpretation   Medicines ordered and prescription drug management: Meds ordered this encounter  Medications   droperidol (INAPSINE) 2.5 MG/ML injection 1.25 mg   LORazepam (ATIVAN) injection 0.5 mg   DISCONTD: nystatin (MYCOSTATIN/NYSTOP) topical powder    fentaNYL (SUBLIMAZE) injection 50 mcg   iohexol (OMNIPAQUE) 350 MG/ML injection 75 mL   AND Linked Order Group    alum & mag hydroxide-simeth (MAALOX/MYLANTA) 200-200-20 MG/5ML suspension 30 mL    lidocaine (XYLOCAINE) 2 % viscous mouth solution 15 mL   nystatin (MYCOSTATIN/NYSTOP) powder    Sig: Apply 1 Application topically 3 (three) times daily.    Dispense:  15 g    Refill:  0    -I have reviewed the patients home medicines and have made adjustments as needed  Critical interventions none  Consultations Obtained: I requested consultation with the [facility] gastroenterology team,  and discussed lab and imaging findings as well as pertinent plan - they recommend: Outpatient follow-up   Cardiac Monitoring: The patient was maintained on a cardiac monitor.  I personally viewed and interpreted the cardiac monitored which showed an underlying rhythm of: NSR  Social Determinants of Health:  Factors impacting patients care include: none   Reevaluation: After the interventions noted above, I reevaluated the patient and found that they have :improved  Co morbidities that complicate the patient evaluation  Past Medical History:  Diagnosis Date   Arthritis    Back and legs   Chronic back pain    DVT (deep venous thrombosis) (HCC)    early 20's leg   GIB (gastrointestinal bleeding) 09/13/2017   History of blood transfusion    Hypertension    Migraine headache    Mood disorder ([place])    previously documented as bipolar and MDD   Neuropathy    Pneumonia    Positive PPD    x 2 last time 09/2017   Sciatica    Stress incontinence       Dispostion: I considered admission for this patient, but at this time she does not meet inpatient criteria  for admission she is safe for discharge with outpatient follow-up     Final Clinical Impression(s) / ED Diagnoses Final diagnoses:  Abdominal pain, unspecified abdominal location     @PCDICTATION @    Roopa Graver, Debe Coder,  MD 08/12/22 1112

## 2022-08-11 NOTE — ED Triage Notes (Signed)
Pt brought in by EMS from home with L sided chest pain that wraps around under the L breast to their back with +N/V x4 days. Pain worsens with movement.   Pt given 324 ASA and one sublingual nitro with no change in pain en route.

## 2022-09-06 ENCOUNTER — Ambulatory Visit: Payer: Self-pay | Admitting: Nurse Practitioner

## 2022-10-01 ENCOUNTER — Ambulatory Visit: Payer: Self-pay | Admitting: Nurse Practitioner

## 2023-03-25 DEATH — deceased
